# Patient Record
Sex: Female | Born: 1955
Health system: Southern US, Community
[De-identification: ages and names within clinical notes are randomized; demographics above are authoritative.]

## PROBLEM LIST (undated history)

## (undated) DIAGNOSIS — C50919 Malignant neoplasm of unspecified site of unspecified female breast: Secondary | ICD-10-CM

## (undated) DIAGNOSIS — T451X5A Adverse effect of antineoplastic and immunosuppressive drugs, initial encounter: Secondary | ICD-10-CM

## (undated) DIAGNOSIS — I219 Acute myocardial infarction, unspecified: Secondary | ICD-10-CM

## (undated) DIAGNOSIS — I2699 Other pulmonary embolism without acute cor pulmonale: Secondary | ICD-10-CM

## (undated) DIAGNOSIS — G629 Polyneuropathy, unspecified: Secondary | ICD-10-CM

## (undated) DIAGNOSIS — I739 Peripheral vascular disease, unspecified: Secondary | ICD-10-CM

## (undated) DIAGNOSIS — I10 Essential (primary) hypertension: Secondary | ICD-10-CM

## (undated) DIAGNOSIS — I471 Supraventricular tachycardia, unspecified: Secondary | ICD-10-CM

## (undated) DIAGNOSIS — R000 Tachycardia, unspecified: Secondary | ICD-10-CM

## (undated) DIAGNOSIS — K922 Gastrointestinal hemorrhage, unspecified: Secondary | ICD-10-CM

## (undated) DIAGNOSIS — R040 Epistaxis: Secondary | ICD-10-CM

## (undated) DIAGNOSIS — G620 Drug-induced polyneuropathy: Secondary | ICD-10-CM

## (undated) DIAGNOSIS — E785 Hyperlipidemia, unspecified: Secondary | ICD-10-CM

## (undated) DIAGNOSIS — A419 Sepsis, unspecified organism: Secondary | ICD-10-CM

## (undated) DIAGNOSIS — I427 Cardiomyopathy due to drug and external agent: Secondary | ICD-10-CM

## (undated) HISTORY — DX: Sepsis, unspecified organism: A41.9

## (undated) HISTORY — DX: Adverse effect of antineoplastic and immunosuppressive drugs, initial encounter: T45.1X5A

## (undated) HISTORY — DX: Hyperlipidemia, unspecified: E78.5

## (undated) HISTORY — DX: Essential (primary) hypertension: I10

## (undated) HISTORY — PX: BREAST SURGERY: SHX581

## (undated) HISTORY — DX: Acute myocardial infarction, unspecified: I21.9

## (undated) HISTORY — DX: Adverse effect of antineoplastic and immunosuppressive drugs, initial encounter: G62.0

## (undated) HISTORY — PX: CARDIAC CATHETERIZATION: SHX172

---

## 1996-09-28 DIAGNOSIS — I219 Acute myocardial infarction, unspecified: Secondary | ICD-10-CM

## 1996-09-28 HISTORY — DX: Acute myocardial infarction, unspecified: I21.9

## 2004-07-24 ENCOUNTER — Ambulatory Visit: Payer: Self-pay

## 2005-10-19 ENCOUNTER — Ambulatory Visit: Payer: Self-pay

## 2007-02-10 ENCOUNTER — Ambulatory Visit: Payer: Self-pay

## 2007-02-24 ENCOUNTER — Ambulatory Visit: Payer: Self-pay

## 2007-03-24 ENCOUNTER — Ambulatory Visit: Payer: Self-pay

## 2007-09-20 ENCOUNTER — Ambulatory Visit: Payer: Self-pay

## 2011-11-09 ENCOUNTER — Ambulatory Visit: Payer: Self-pay

## 2014-09-28 HISTORY — PX: BREAST BIOPSY: SHX20

## 2014-12-10 ENCOUNTER — Ambulatory Visit: Payer: Self-pay | Admitting: Family Medicine

## 2014-12-18 ENCOUNTER — Ambulatory Visit: Payer: Self-pay | Admitting: Family Medicine

## 2014-12-18 HISTORY — PX: BREAST SURGERY: SHX581

## 2014-12-25 ENCOUNTER — Ambulatory Visit
Admit: 2014-12-25 | Disposition: A | Discharge status: Home / Self Care | Payer: Self-pay | Attending: Internal Medicine | Admitting: Internal Medicine

## 2014-12-25 LAB — COMPREHENSIVE METABOLIC PANEL
ALBUMIN: 4.5 g/dL
AST: 31 U/L
Alkaline Phosphatase: 61 U/L
Anion Gap: 8 (ref 7–16)
BILIRUBIN TOTAL: 0.9 mg/dL
BUN: 12 mg/dL
CREATININE: 0.81 mg/dL
Calcium, Total: 9.2 mg/dL
Chloride: 104 mmol/L
Co2: 27 mmol/L
EGFR (Non-African Amer.): >60
Glucose: 99 mg/dL
POTASSIUM: 3.8 mmol/L
SGPT (ALT): 28 U/L
SODIUM: 139 mmol/L
TOTAL PROTEIN: 7.9 g/dL

## 2014-12-25 LAB — APTT: Activated PTT: 31 secs (ref 23.6–35.9)

## 2014-12-25 LAB — CBC CANCER CENTER
BASOS ABS: 0 x10 3/mm (ref 0.0–0.1)
Basophil %: 0.6 %
EOS ABS: 0.4 x10 3/mm (ref 0.0–0.7)
Eosinophil %: 6.3 %
HCT: 47 % (ref 35.0–47.0)
HGB: 15.1 g/dL (ref 12.0–16.0)
LYMPHS ABS: 2.4 x10 3/mm (ref 1.0–3.6)
Lymphocyte %: 35.8 %
MCH: 28.9 pg (ref 26.0–34.0)
MCHC: 32 g/dL (ref 32.0–36.0)
MCV: 90 fL (ref 80–100)
Monocyte #: 0.3 x10 3/mm (ref 0.2–0.9)
Monocyte %: 4.5 %
Neutrophil #: 3.6 x10 3/mm (ref 1.4–6.5)
Neutrophil %: 52.8 %
Platelet: 425 x10 3/mm (ref 150–440)
RBC: 5.21 10*6/uL — ABNORMAL HIGH (ref 3.80–5.20)
RDW: 14.5 % (ref 11.5–14.5)
WBC: 6.8 x10 3/mm (ref 3.6–11.0)

## 2014-12-25 LAB — PROTIME-INR
INR: 1
Prothrombin Time: 13.4 secs

## 2014-12-26 LAB — CANCER ANTIGEN 27.29: CA 27.29: 48.8 U/mL — ABNORMAL HIGH (ref 0.0–38.6)

## 2014-12-27 ENCOUNTER — Ambulatory Visit (INDEPENDENT_AMBULATORY_CARE_PROVIDER_SITE_OTHER): Payer: BLUE CROSS/BLUE SHIELD | Admitting: General Surgery

## 2014-12-27 ENCOUNTER — Other Ambulatory Visit: Payer: Self-pay | Admitting: General Surgery

## 2014-12-27 ENCOUNTER — Encounter: Payer: Self-pay | Admitting: General Surgery

## 2014-12-27 VITALS — BP 130/78 | Ht 66.0 in | Wt 163.0 lb

## 2014-12-27 DIAGNOSIS — C50912 Malignant neoplasm of unspecified site of left female breast: Secondary | ICD-10-CM

## 2014-12-27 DIAGNOSIS — Z170 Estrogen receptor positive status [ER+]: Secondary | ICD-10-CM

## 2014-12-27 DIAGNOSIS — C50412 Malignant neoplasm of upper-outer quadrant of left female breast: Secondary | ICD-10-CM | POA: Insufficient documentation

## 2014-12-27 NOTE — Progress Notes (Signed)
Patient ID: Sarah Horne, female   DOB: September 09, 1956, 59 y.o.   MRN: [id number]  Chief Complaint  Patient presents with  . Other    port placement    HPI Sarah Horne is a 59 y.o. female here today for a evaluation of a port placement. Patient sttates she starts chemo on 01/01/15 for a large left breast cancer. Patient had a left breast biopsy on 12/20/14. She is scheduled for a pet scan on 12/31/14.  HPI  Past Medical History  Diagnosis Date  . Hypertension   . Hyperlipidemia   . Heart attack 1998    Past Surgical History  Procedure Laterality Date  . Breast surgery Left 12/20/14    breast biopsy    Family History  Problem Relation Age of Onset  . Breast cancer Maternal Aunt   . Breast cancer Cousin   . Brain cancer Maternal Uncle     Social History History  Substance Use Topics  . Smoking status: Current Every Day Smoker -- 1.00 packs/day for 18 years    Types: Cigarettes  . Smokeless tobacco: Not on file  . Alcohol Use: No    No Known Allergies  Current Outpatient Prescriptions  Medication Sig Dispense Refill  . aspirin 325 MG tablet Take 325 mg by mouth daily.    Marland Kitchen atenolol (TENORMIN) 25 MG tablet     . lisinopril (PRINIVIL,ZESTRIL) 10 MG tablet     . simvastatin (ZOCOR) 40 MG tablet      No current facility-administered medications for this visit.    Review of Systems Review of Systems  Constitutional: Negative.   Respiratory: Negative.   Cardiovascular: Negative.     Blood pressure 130/78, height 5' 6"  (1.676 m), weight 163 lb (73.936 kg).  Physical Exam Physical Exam  Constitutional: She is oriented to person, place, and time. She appears well-developed and well-nourished.  Eyes: Conjunctivae are normal. No scleral icterus.  Neck: Neck supple.  Cardiovascular: Normal rate, regular rhythm and normal heart sounds.   No evidence of upper extremity venous occlusion.  Pulmonary/Chest: Effort normal and breath sounds normal.  Lymphadenopathy:    She has  no cervical adenopathy.  Neurological: She is alert and oriented to person, place, and time.  Skin: Skin is warm and dry.    Data Reviewed Medical oncology note of 12/25/2014. Mammogram dated 12/10/2014 showed a poorly defined mass with irregular microcalcifications in skin thickening in the upper-outer quadrant of the left breast. Enlarged axillary node identified.  Cardiac echo dated 12/26/2014 shows an ejection fraction of 50-55%.  Left breast biopsy shows invasive ductal carcinoma with lymphovascular invasion. Lymph node biopsy showed poorly differentiated carcinoma with breast primary. Grade 3. ER 90%, PR 50-90%, HER-2/neu 3+.  Assessment    Stage II carcinoma the left breast.    Plan    The risks and benefits of port placement including that of pneumothorax was reviewed with the patient. She is scheduled for a pet scan on Monday, 12/31/2014 and to begin treatment on Tuesday, 01/01/2015. Arrangements are in place for port placement after her PET scan. I spoke with nuclear medicine and requested that they leave her intravenous for the patent scan in (left side preferred) for use during her procedure in the operating room. The importance of having a driver after the procedure was discussed.     PCP:  Keith Rake Asad A Ref. Dr. Charletta Cousin, [place] 12/27/2014, 2:10 PM

## 2014-12-27 NOTE — Patient Instructions (Signed)
Implanted Port Insertion An implanted port is a central line that has a round shape and is placed under the skin. It is used as a long-term IV access for:   Medicines, such as chemotherapy.   Fluids.   Liquid nutrition, such as total parenteral nutrition (TPN).   Blood samples.  LET YOUR HEALTH CARE PROVIDER KNOW ABOUT:  Allergies to food or medicine.   Medicines taken, including vitamins, herbs, eye drops, creams, and over-the-counter medicines.   Any allergies to heparin.  Use of steroids (by mouth or creams).   Previous problems with anesthetics or numbing medicines.   History of bleeding problems or blood clots.   Previous surgery.   Other health problems, including diabetes and kidney problems.   Possibility of pregnancy, if this applies. RISKS AND COMPLICATIONS Generally, this is a safe procedure. However, as with any procedure, problems can occur. Possible problems include:  Damage to the blood vessel, bruising, or bleeding at the puncture site.   Infection.  Blood clot in the vessel that the port is in.  Breakdown of the skin over your port.  Very rarely a person may develop a condition called a pneumothorax, a collection of air in the chest that may cause one of the lungs to collapse. The placement of these catheters with the appropriate imaging guidance significantly decreases the risk of a pneumothorax.  BEFORE THE PROCEDURE   Your health care provider may want you to have blood tests. These tests can help tell how well your kidneys and liver are working. They can also show how well your blood clots.   If you take blood thinners (anticoagulant medicines), ask your health care provider when you should stop taking them.   Make arrangements for someone to drive you home. This is necessary if you have been sedated for your procedure.  PROCEDURE  Port insertion usually takes about 30-45 minutes.   An IV needle will be inserted in your arm.  Medicine for pain and medicine to help relax you (sedative) will flow directly into your body through this needle.   You will lie on an exam table, and you will be connected to monitors to keep track of your heart rate, blood pressure, and breathing throughout the procedure.  An oxygen monitoring device may be attached to your finger. Oxygen will be given.   Everything will be kept as germ free (sterile) as possible during the procedure. The skin near the point of the incision will be cleansed with antiseptic, and the area will be draped with sterile towels. The skin and deeper tissues over the port area will be made numb with a local anesthetic.  Two small cuts (incisions) will be made in the skin to insert the port. One will be made in the neck to obtain access to the vein where the catheter will lie.   Because the port reservoir will be placed under the skin, a small skin incision will be made in the upper chest, and a small pocket for the port will be made under the skin. The catheter that will be connected to the port tunnels to a large central vein in the chest. A small, raised area will remain on your body at the site of the reservoir when the procedure is complete.  The port placement will be done under imaging guidance to ensure the proper placement.  The reservoir has a silicone covering that can be punctured with a special needle.   The port will be flushed with normal   saline, and blood will be drawn to make sure it is working properly.  There will be nothing remaining outside the skin when the procedure is finished.   Incisions will be held together by stitches, surgical glue, or a special tape. AFTER THE PROCEDURE  You will stay in a recovery area until the anesthesia has worn off. Your blood pressure and pulse will be checked.  A final chest X-ray will be taken to check the placement of the port and to ensure that there is no injury to your lung. Document Released:  07/05/2013 Document Revised: 01/29/2014 Document Reviewed: 07/05/2013 [facility] Patient Information 2015 [place], [place]. This information is not intended to replace advice given to you by your health care provider. Make sure you discuss any questions you have with your health care provider.

## 2014-12-28 ENCOUNTER — Ambulatory Visit
Admit: 2014-12-28 | Disposition: A | Discharge status: Home / Self Care | Payer: Self-pay | Attending: Internal Medicine | Admitting: Internal Medicine

## 2014-12-31 ENCOUNTER — Ambulatory Visit
Admit: 2014-12-31 | Disposition: A | Discharge status: Home / Self Care | Payer: Self-pay | Attending: Internal Medicine | Admitting: Internal Medicine

## 2014-12-31 ENCOUNTER — Ambulatory Visit
Admit: 2014-12-31 | Disposition: A | Discharge status: Home / Self Care | Payer: Self-pay | Attending: General Surgery | Admitting: General Surgery

## 2014-12-31 ENCOUNTER — Encounter: Payer: Self-pay | Admitting: General Surgery

## 2014-12-31 DIAGNOSIS — C50912 Malignant neoplasm of unspecified site of left female breast: Secondary | ICD-10-CM | POA: Diagnosis not present

## 2014-12-31 HISTORY — PX: PORTACATH PLACEMENT: SHX2246

## 2015-01-01 LAB — BASIC METABOLIC PANEL
Anion Gap: 7 (ref 7–16)
BUN: 12 mg/dL
CREATININE: 0.83 mg/dL
Calcium, Total: 9 mg/dL
Chloride: 104 mmol/L
Co2: 28 mmol/L
EGFR (African American): >60
EGFR (Non-African Amer.): >60
GLUCOSE: 143 mg/dL — AB
POTASSIUM: 3.5 mmol/L
Sodium: 139 mmol/L

## 2015-01-01 LAB — CBC CANCER CENTER
BASOS PCT: 0.1 %
Basophil #: 0 x10 3/mm (ref 0.0–0.1)
EOS PCT: 0 %
Eosinophil #: 0 x10 3/mm (ref 0.0–0.7)
HCT: 44.7 % (ref 35.0–47.0)
HGB: 14.7 g/dL (ref 12.0–16.0)
LYMPHS PCT: 3.3 %
Lymphocyte #: 0.4 x10 3/mm — ABNORMAL LOW (ref 1.0–3.6)
MCH: 29.8 pg (ref 26.0–34.0)
MCHC: 33 g/dL (ref 32.0–36.0)
MCV: 90 fL (ref 80–100)
Monocyte #: 0.1 x10 3/mm — ABNORMAL LOW (ref 0.2–0.9)
Monocyte %: 0.4 %
NEUTROS PCT: 96.2 %
Neutrophil #: 11.7 x10 3/mm — ABNORMAL HIGH (ref 1.4–6.5)
Platelet: 384 x10 3/mm (ref 150–440)
RBC: 4.94 10*6/uL (ref 3.80–5.20)
RDW: 14.3 % (ref 11.5–14.5)
WBC: 12.2 x10 3/mm — ABNORMAL HIGH (ref 3.6–11.0)

## 2015-01-02 ENCOUNTER — Encounter: Payer: Self-pay | Admitting: General Surgery

## 2015-01-04 ENCOUNTER — Ambulatory Visit
Admit: 2015-01-04 | Disposition: A | Discharge status: Home / Self Care | Payer: Self-pay | Attending: Internal Medicine | Admitting: Internal Medicine

## 2015-01-04 LAB — PROTIME-INR
INR: 1
Prothrombin Time: 13.5 secs

## 2015-01-04 LAB — CBC WITH DIFFERENTIAL/PLATELET
BASOS ABS: 0 10*3/uL (ref 0.0–0.1)
BASOS PCT: 0.7 %
Eosinophil #: 0.4 10*3/uL (ref 0.0–0.7)
Eosinophil %: 5.7 %
HCT: 43.8 % (ref 35.0–47.0)
HGB: 14.5 g/dL (ref 12.0–16.0)
LYMPHS PCT: 36.2 %
Lymphocyte #: 2.6 10*3/uL (ref 1.0–3.6)
MCH: 29.9 pg (ref 26.0–34.0)
MCHC: 33 g/dL (ref 32.0–36.0)
MCV: 91 fL (ref 80–100)
MONO ABS: 0.4 x10 3/mm (ref 0.2–0.9)
Monocyte %: 5.6 %
NEUTROS ABS: 3.7 10*3/uL (ref 1.4–6.5)
NEUTROS PCT: 51.8 %
Platelet: 366 10*3/uL (ref 150–440)
RBC: 4.84 10*6/uL (ref 3.80–5.20)
RDW: 14.2 % (ref 11.5–14.5)
WBC: 7.1 10*3/uL (ref 3.6–11.0)

## 2015-01-08 LAB — COMPREHENSIVE METABOLIC PANEL
ALBUMIN: 3.9 g/dL
ALK PHOS: 61 U/L
Anion Gap: 8 (ref 7–16)
BUN: 17 mg/dL
Bilirubin,Total: 0.8 mg/dL
CALCIUM: 9.3 mg/dL
Chloride: 101 mmol/L
Co2: 26 mmol/L
Creatinine: 0.78 mg/dL
EGFR (Non-African Amer.): >60
Glucose: 161 mg/dL — ABNORMAL HIGH
Potassium: 3.7 mmol/L
SGOT(AST): 38 U/L
SGPT (ALT): 38 U/L
SODIUM: 135 mmol/L
Total Protein: 7.6 g/dL

## 2015-01-08 LAB — CBC CANCER CENTER
BASOS PCT: 0.3 %
Basophil #: 0 x10 3/mm (ref 0.0–0.1)
EOS PCT: 0.1 %
Eosinophil #: 0 x10 3/mm (ref 0.0–0.7)
HCT: 43.9 % (ref 35.0–47.0)
HGB: 14.5 g/dL (ref 12.0–16.0)
Lymphocyte #: 0.9 x10 3/mm — ABNORMAL LOW (ref 1.0–3.6)
Lymphocyte %: 6.2 %
MCH: 29.8 pg (ref 26.0–34.0)
MCHC: 33.1 g/dL (ref 32.0–36.0)
MCV: 90 fL (ref 80–100)
Monocyte #: 0.2 x10 3/mm (ref 0.2–0.9)
Monocyte %: 1.1 %
NEUTROS PCT: 92.3 %
Neutrophil #: 13 x10 3/mm — ABNORMAL HIGH (ref 1.4–6.5)
PLATELETS: 416 x10 3/mm (ref 150–440)
RBC: 4.87 10*6/uL (ref 3.80–5.20)
RDW: 14.4 % (ref 11.5–14.5)
WBC: 14.1 x10 3/mm — ABNORMAL HIGH (ref 3.6–11.0)

## 2015-01-08 LAB — CREATININE, SERUM: CREATINE, SERUM: 0.78

## 2015-01-15 LAB — CBC CANCER CENTER
BASOS ABS: 0 x10 3/mm (ref 0.0–0.1)
Basophil %: 1.3 %
EOS PCT: 0.2 %
Eosinophil #: 0 x10 3/mm (ref 0.0–0.7)
HCT: 42.1 % (ref 35.0–47.0)
HGB: 13.8 g/dL (ref 12.0–16.0)
LYMPHS ABS: 1.9 x10 3/mm (ref 1.0–3.6)
Lymphocyte %: 85.3 %
MCH: 29.6 pg (ref 26.0–34.0)
MCHC: 32.9 g/dL (ref 32.0–36.0)
MCV: 90 fL (ref 80–100)
MONO ABS: 0 x10 3/mm — AB (ref 0.2–0.9)
MONOS PCT: 1.5 %
NEUTROS PCT: 11.7 %
Neutrophil #: 0.3 x10 3/mm — ABNORMAL LOW (ref 1.4–6.5)
Platelet: 344 x10 3/mm (ref 150–440)
RBC: 4.67 10*6/uL (ref 3.80–5.20)
RDW: 14 % (ref 11.5–14.5)
WBC: 2.2 x10 3/mm — ABNORMAL LOW (ref 3.6–11.0)

## 2015-01-21 LAB — SURGICAL PATHOLOGY

## 2015-01-22 LAB — CBC CANCER CENTER
BASOS PCT: 0.1 %
Basophil #: 0 x10 3/mm (ref 0.0–0.1)
EOS PCT: 0 %
Eosinophil #: 0 x10 3/mm (ref 0.0–0.7)
HCT: 36.6 % (ref 35.0–47.0)
HGB: 11.9 g/dL — AB (ref 12.0–16.0)
LYMPHS ABS: 3.4 x10 3/mm (ref 1.0–3.6)
Lymphocyte %: 8.6 %
MCH: 29.3 pg (ref 26.0–34.0)
MCHC: 32.6 g/dL (ref 32.0–36.0)
MCV: 90 fL (ref 80–100)
MONO ABS: 1 x10 3/mm — AB (ref 0.2–0.9)
MONOS PCT: 2.6 %
NEUTROS ABS: 34.8 x10 3/mm — AB (ref 1.4–6.5)
Neutrophil %: 88.7 %
Platelet: 272 x10 3/mm (ref 150–440)
RBC: 4.08 10*6/uL (ref 3.80–5.20)
RDW: 14.1 % (ref 11.5–14.5)
WBC: 39.3 x10 3/mm — AB (ref 3.6–11.0)

## 2015-01-22 LAB — CREATININE, SERUM: Creatinine: 0.82 mg/dL

## 2015-01-22 LAB — HEPATIC FUNCTION PANEL A (ARMC)
ALK PHOS: 157 U/L — AB
ALT: 42 U/L
AST: 34 U/L
Albumin: 3.9 g/dL
Bilirubin, Direct: <0.1 mg/dL
Bilirubin,Total: 0.2 mg/dL — ABNORMAL LOW
TOTAL PROTEIN: 6.8 g/dL

## 2015-01-27 NOTE — Op Note (Signed)
PATIENT NAME:  Sarah Horne, Sarah Horne MR#:  [id number] DATE OF BIRTH:  1956-09-23  DATE OF PROCEDURE:  12/31/2014  PREOPERATIVE DIAGNOSIS: Breast cancer, need for central venous access.   POSTOPERATIVE DIAGNOSIS: Breast cancer, need for central venous access.  OPERATIVE PROCEDURE: Right subclavian PowerPort placement with ultrasound and fluoroscopic guidance.   SURGEON: [name], MD   ANESTHESIA: Local, 15 mL of 1% plain Xylocaine.   ESTIMATED BLOOD LOSS: Minimal.   CLINICAL NOTE: This 59 year old woman has a T2, N1 cancer and is considered a candidate for neoadjuvant chemotherapy. Central venous access was requested by her treating oncologist.   PROCEDURE IN DETAIL: The patient received Kefzol prior to the procedure. The right chest and neck were prepped with ChloraPrep and draped. Ultrasound was used to confirm patency of the subclavian vein. This was cannulated under ultrasound guidance. The guidewire was passed followed by the dilator and catheter. The catheter was placed in the distal SVC/right atrial junction. It was tunneled to a pocket on the right anterior chest. The port was transfixed to the deep tissue with 3-0 Prolene sutures. The subcutaneous tissue was approximated with running 3-0 Vicryl, and the skin closed with a running 4-0 Vicryl subcuticular suture. Benzoin, Steri-Strips, Telfa, and Tegaderm dressings were applied.   Erect chest x-ray in the recovery room showed the catheter tip as described above and no evidence of pneumothorax.   The patient tolerated the procedure well and was taken to the recovery room in stable condition.   ____________________________ [name], MD jwb:bm D: 01/01/2015 21:05:09 ET T: 01/02/2015 07:56:01 ET JOB#: [id number]  cc: [name], MD, <Dictator> Sandeep R. Ma Hillock, MD Rochel Brome, MD JEFFREY Amedeo Kinsman MD ELECTRONICALLY SIGNED 01/02/2015 22:14

## 2015-01-28 ENCOUNTER — Other Ambulatory Visit: Payer: Self-pay

## 2015-01-28 ENCOUNTER — Ambulatory Visit: Payer: Self-pay

## 2015-01-28 ENCOUNTER — Other Ambulatory Visit: Payer: Self-pay | Admitting: *Deleted

## 2015-01-28 ENCOUNTER — Ambulatory Visit: Payer: Self-pay | Admitting: Internal Medicine

## 2015-01-28 DIAGNOSIS — C50912 Malignant neoplasm of unspecified site of left female breast: Secondary | ICD-10-CM

## 2015-01-29 ENCOUNTER — Inpatient Hospital Stay: Payer: BLUE CROSS/BLUE SHIELD | Attending: Internal Medicine

## 2015-01-29 ENCOUNTER — Inpatient Hospital Stay: Payer: BLUE CROSS/BLUE SHIELD

## 2015-01-29 ENCOUNTER — Inpatient Hospital Stay (HOSPITAL_BASED_OUTPATIENT_CLINIC_OR_DEPARTMENT_OTHER): Payer: BLUE CROSS/BLUE SHIELD | Admitting: Internal Medicine

## 2015-01-29 VITALS — BP 127/81 | HR 58

## 2015-01-29 VITALS — BP 146/92 | HR 67 | Temp 96.0°F | Ht 65.0 in | Wt 163.6 lb

## 2015-01-29 DIAGNOSIS — Z803 Family history of malignant neoplasm of breast: Secondary | ICD-10-CM

## 2015-01-29 DIAGNOSIS — I10 Essential (primary) hypertension: Secondary | ICD-10-CM | POA: Insufficient documentation

## 2015-01-29 DIAGNOSIS — Z5111 Encounter for antineoplastic chemotherapy: Secondary | ICD-10-CM | POA: Diagnosis not present

## 2015-01-29 DIAGNOSIS — C50912 Malignant neoplasm of unspecified site of left female breast: Secondary | ICD-10-CM

## 2015-01-29 DIAGNOSIS — C50412 Malignant neoplasm of upper-outer quadrant of left female breast: Secondary | ICD-10-CM | POA: Insufficient documentation

## 2015-01-29 DIAGNOSIS — Z7982 Long term (current) use of aspirin: Secondary | ICD-10-CM | POA: Diagnosis not present

## 2015-01-29 DIAGNOSIS — K769 Liver disease, unspecified: Secondary | ICD-10-CM

## 2015-01-29 DIAGNOSIS — Z170 Estrogen receptor positive status [ER+]: Secondary | ICD-10-CM

## 2015-01-29 DIAGNOSIS — T451X5A Adverse effect of antineoplastic and immunosuppressive drugs, initial encounter: Secondary | ICD-10-CM | POA: Insufficient documentation

## 2015-01-29 DIAGNOSIS — Z7689 Persons encountering health services in other specified circumstances: Secondary | ICD-10-CM | POA: Insufficient documentation

## 2015-01-29 DIAGNOSIS — F17210 Nicotine dependence, cigarettes, uncomplicated: Secondary | ICD-10-CM | POA: Diagnosis not present

## 2015-01-29 DIAGNOSIS — R531 Weakness: Secondary | ICD-10-CM | POA: Diagnosis not present

## 2015-01-29 DIAGNOSIS — I252 Old myocardial infarction: Secondary | ICD-10-CM | POA: Diagnosis not present

## 2015-01-29 DIAGNOSIS — D72829 Elevated white blood cell count, unspecified: Secondary | ICD-10-CM | POA: Diagnosis not present

## 2015-01-29 DIAGNOSIS — C773 Secondary and unspecified malignant neoplasm of axilla and upper limb lymph nodes: Secondary | ICD-10-CM

## 2015-01-29 DIAGNOSIS — E785 Hyperlipidemia, unspecified: Secondary | ICD-10-CM

## 2015-01-29 DIAGNOSIS — Z79899 Other long term (current) drug therapy: Secondary | ICD-10-CM

## 2015-01-29 LAB — BASIC METABOLIC PANEL
ANION GAP: 5 (ref 5–15)
BUN: 16 mg/dL (ref 6–20)
CALCIUM: 8.7 mg/dL — AB (ref 8.9–10.3)
CHLORIDE: 103 mmol/L (ref 101–111)
CO2: 26 mmol/L (ref 22–32)
Creatinine, Ser: 0.78 mg/dL (ref 0.44–1.00)
GFR calc Af Amer: >60 mL/min (ref >60)
GFR calc non Af Amer: >60 mL/min (ref >60)
Glucose, Bld: 138 mg/dL — ABNORMAL HIGH (ref 65–99)
Potassium: 3.8 mmol/L (ref 3.5–5.1)
Sodium: 134 mmol/L — ABNORMAL LOW (ref 135–145)

## 2015-01-29 LAB — CBC WITH DIFFERENTIAL/PLATELET
BASOS ABS: 0 10*3/uL (ref 0–0.1)
Eosinophils Absolute: 0 10*3/uL (ref 0–0.7)
Eosinophils Relative: 0 %
HCT: 36.7 % (ref 35.0–47.0)
HEMOGLOBIN: 12.2 g/dL (ref 12.0–16.0)
Lymphocytes Relative: 14 %
Lymphs Abs: 0.5 10*3/uL — ABNORMAL LOW (ref 1.0–3.6)
MCH: 29.8 pg (ref 26.0–34.0)
MCHC: 33.2 g/dL (ref 32.0–36.0)
MCV: 89.8 fL (ref 80.0–100.0)
Monocytes Absolute: 0 10*3/uL — ABNORMAL LOW (ref 0.2–0.9)
Monocytes Relative: 1 %
NEUTROS ABS: 3.1 10*3/uL (ref 1.4–6.5)
Neutrophils Relative %: 85 %
Platelets: 293 10*3/uL (ref 150–440)
RBC: 4.09 MIL/uL (ref 3.80–5.20)
RDW: 14.6 % — ABNORMAL HIGH (ref 11.5–14.5)
WBC: 3.7 10*3/uL (ref 3.6–11.0)

## 2015-01-29 MED ORDER — SODIUM CHLORIDE 0.9 % IV SOLN: Freq: Once | INTRAVENOUS | Status: DC

## 2015-01-29 MED ORDER — SODIUM CHLORIDE 0.9 % IV SOLN
550.5000 mg | Freq: Once | INTRAVENOUS | Status: AC
  Administered 2015-01-29: 550.5 mg via INTRAVENOUS
  Filled 2015-01-29: qty 55.05

## 2015-01-29 MED ORDER — FOSAPREPITANT DIMEGLUMINE INJECTION 150 MG
Freq: Once | INTRAVENOUS | Status: AC
  Administered 2015-01-29: 12:00:00 via INTRAVENOUS
  Filled 2015-01-29: qty 5

## 2015-01-29 MED ORDER — ACETAMINOPHEN 325 MG PO TABS
650.0000 mg | ORAL_TABLET | Freq: Once | ORAL | Status: AC
  Administered 2015-01-29: 650 mg via ORAL
  Filled 2015-01-29: qty 2

## 2015-01-29 MED ORDER — LIDOCAINE-PRILOCAINE 2.5-2.5 % EX CREA: TOPICAL_CREAM | CUTANEOUS | Status: DC

## 2015-01-29 MED ORDER — PALONOSETRON HCL INJECTION 0.25 MG/5ML
0.2500 mg | Freq: Once | INTRAVENOUS | Status: AC
Start: 2015-01-29 — End: 2015-01-29
  Administered 2015-01-29: 0.25 mg via INTRAVENOUS
  Filled 2015-01-29: qty 5

## 2015-01-29 MED ORDER — SODIUM CHLORIDE 0.9 % IV SOLN
420.0000 mg | Freq: Once | INTRAVENOUS | Status: AC
  Administered 2015-01-29: 420 mg via INTRAVENOUS
  Filled 2015-01-29: qty 14

## 2015-01-29 MED ORDER — DOCETAXEL CHEMO INJECTION 160 MG/16ML
75.0000 mg/m2 | Freq: Once | INTRAVENOUS | Status: AC
  Administered 2015-01-29: 140 mg via INTRAVENOUS
  Filled 2015-01-29: qty 14

## 2015-01-29 MED ORDER — SODIUM CHLORIDE 0.9 % IV SOLN
440.0000 mg | Freq: Once | INTRAVENOUS | Status: AC
  Administered 2015-01-29: 440 mg via INTRAVENOUS
  Filled 2015-01-29: qty 20.95

## 2015-01-29 MED ORDER — HEPARIN SOD (PORK) LOCK FLUSH 100 UNIT/ML IV SOLN
INTRAVENOUS | Status: AC
  Filled 2015-01-29: qty 5

## 2015-01-29 MED ORDER — DIPHENHYDRAMINE HCL 50 MG/ML IJ SOLN
25.0000 mg | Freq: Once | INTRAMUSCULAR | Status: AC
  Administered 2015-01-29: 25 mg via INTRAVENOUS
  Filled 2015-01-29: qty 1

## 2015-01-29 MED ORDER — SODIUM CHLORIDE 0.9 % IV SOLN
Freq: Once | INTRAVENOUS | Status: AC
  Administered 2015-01-29: 12:00:00 via INTRAVENOUS
  Filled 2015-01-29: qty 250

## 2015-01-29 MED ORDER — FAMOTIDINE IN NACL 20-0.9 MG/50ML-% IV SOLN
20.0000 mg | Freq: Two times a day (BID) | INTRAVENOUS | Status: DC
  Administered 2015-01-29: 20 mg via INTRAVENOUS
  Filled 2015-01-29 (×3): qty 50

## 2015-01-30 ENCOUNTER — Other Ambulatory Visit: Payer: Self-pay | Admitting: *Deleted

## 2015-01-31 ENCOUNTER — Inpatient Hospital Stay: Payer: BLUE CROSS/BLUE SHIELD

## 2015-01-31 VITALS — BP 142/82 | HR 64 | Temp 98.0°F | Resp 20

## 2015-01-31 DIAGNOSIS — C50912 Malignant neoplasm of unspecified site of left female breast: Secondary | ICD-10-CM

## 2015-01-31 DIAGNOSIS — C50412 Malignant neoplasm of upper-outer quadrant of left female breast: Secondary | ICD-10-CM | POA: Diagnosis not present

## 2015-01-31 MED ORDER — PEGFILGRASTIM INJECTION 6 MG/0.6ML
6.0000 mg | Freq: Once | SUBCUTANEOUS | Status: AC
  Administered 2015-01-31: 6 mg via SUBCUTANEOUS
  Filled 2015-01-31: qty 0.6

## 2015-02-05 ENCOUNTER — Inpatient Hospital Stay: Payer: BLUE CROSS/BLUE SHIELD

## 2015-02-05 DIAGNOSIS — C50412 Malignant neoplasm of upper-outer quadrant of left female breast: Secondary | ICD-10-CM | POA: Diagnosis not present

## 2015-02-05 LAB — CBC WITH DIFFERENTIAL/PLATELET
Basophils Absolute: 0 10*3/uL (ref 0–0.1)
Basophils Relative: 1 %
Eosinophils Absolute: 0 10*3/uL (ref 0–0.7)
Eosinophils Relative: 0 %
HCT: 38.5 % (ref 35.0–47.0)
HEMOGLOBIN: 12.8 g/dL (ref 12.0–16.0)
LYMPHS PCT: 38 %
Lymphs Abs: 2 10*3/uL (ref 1.0–3.6)
MCH: 29.9 pg (ref 26.0–34.0)
MCHC: 33.3 g/dL (ref 32.0–36.0)
MCV: 89.9 fL (ref 80.0–100.0)
Monocytes Absolute: 0.8 10*3/uL (ref 0.2–0.9)
Monocytes Relative: 15 %
NEUTROS PCT: 46 %
Neutro Abs: 2.4 10*3/uL (ref 1.4–6.5)
Platelets: 244 10*3/uL (ref 150–440)
RBC: 4.28 MIL/uL (ref 3.80–5.20)
RDW: 14.8 % — ABNORMAL HIGH (ref 11.5–14.5)
WBC: 5.2 10*3/uL (ref 3.6–11.0)

## 2015-02-12 ENCOUNTER — Inpatient Hospital Stay: Payer: BLUE CROSS/BLUE SHIELD

## 2015-02-12 DIAGNOSIS — C50412 Malignant neoplasm of upper-outer quadrant of left female breast: Secondary | ICD-10-CM | POA: Diagnosis not present

## 2015-02-12 LAB — HEPATIC FUNCTION PANEL
ALK PHOS: 97 U/L (ref 38–126)
ALT: 34 U/L (ref 14–54)
AST: 27 U/L (ref 15–41)
Albumin: 4 g/dL (ref 3.5–5.0)
Total Bilirubin: 0.4 mg/dL (ref 0.3–1.2)
Total Protein: 6.6 g/dL (ref 6.5–8.1)

## 2015-02-12 LAB — CBC WITH DIFFERENTIAL/PLATELET
BASOS ABS: 0 10*3/uL (ref 0–0.1)
Basophils Relative: 0 %
EOS PCT: 0 %
Eosinophils Absolute: 0 10*3/uL (ref 0–0.7)
HEMATOCRIT: 38.7 % (ref 35.0–47.0)
HEMOGLOBIN: 12.5 g/dL (ref 12.0–16.0)
Lymphocytes Relative: 21 %
Lymphs Abs: 3.3 10*3/uL (ref 1.0–3.6)
MCH: 29.4 pg (ref 26.0–34.0)
MCHC: 32.2 g/dL (ref 32.0–36.0)
MCV: 91.3 fL (ref 80.0–100.0)
MONO ABS: 0.5 10*3/uL (ref 0.2–0.9)
MONOS PCT: 3 %
NEUTROS ABS: 11.8 10*3/uL — AB (ref 1.4–6.5)
Neutrophils Relative %: 76 %
Platelets: 229 10*3/uL (ref 150–440)
RBC: 4.24 MIL/uL (ref 3.80–5.20)
RDW: 15.7 % — AB (ref 11.5–14.5)
WBC: 15.7 10*3/uL — ABNORMAL HIGH (ref 3.6–11.0)

## 2015-02-12 LAB — CREATININE, SERUM
CREATININE: 0.82 mg/dL (ref 0.44–1.00)
GFR calc Af Amer: >60 mL/min (ref >60)

## 2015-02-13 NOTE — Progress Notes (Signed)
[place]  Telephone:(336) [phone] Fax:(336) [phone]     ID: Brennan Karam OB: 04-10-1956  MR#: [id number]  KNL#:[id number]  Patient Care Team: [name], MD as PCP - General (Family Medicine) [name], MD (General Surgery) [name], MD as Attending Physician (Internal Medicine)  CHIEF COMPLAINT/DIAGNOSIS:   Invasive ductal carcinoma of the left breast grade 2 on biopsy  (clinically T2N1Mx, stage II disease). Tumor size 4.3 x 4.0 x 3.4 cm. Left axillary lymph node measuring 2.7 cm. Left axillary lymph node biopsy positive for involvement by poorly differentiated carcinoma consistent with breast primary, left breast mass biopsy showed invasive ductal carcinoma grade 2, biopsy done on 12/18/2014. ER positive greater than 90%, PR positive 51% to 90%, HER2/neu positive (3+ on IHC). PET scan 12/31/14 - IMPRESSION: 1. Hypermetabolic left breast mass consists with primary carcinoma. 2. Evidence of local nodal metastasis to the left axilla. 3. No evidence of central nodal metastasis to mediastinum. 4. Two lesions in liver with mild metabolic activity. Differential would include focal nodular hyperplasia versus metastatic breast cancer. Recommend either a contrast-enhanced liver MRI or for added specificity ultrasound-guided hepatic biopsy. CT-guided liver biopsy 01/04/15 - negative for malignancy.  Patient started on Neoadjuvant chemotherapy with Taxotere/Carboplatin/Herceptin/Perjeta on 01/08/15.  HISTORY OF PRESENT ILLNESS:  patient here for scheduled oncology evaluation and plan cycle 2 chemotherapy. States she is doing steady overall. Denies nausea or vomiting. No diarrhea. Appetite is good and denies unintentional weight loss. She has occasional arthritis and low back pain. Denies any new cough, dyspnea, chest pain, or hemoptysis. States she feels breast mass is smaller on self-exam.    REVIEW OF SYSTEMS:   ROS As in HPI above. In addition, no chills,  fevers. No headaches, dizziness or epistaxis. No cough.  No shortness of breath. No wheezing. No hemoptysis. No retrosternal chest pain. No orthopnea, PND. abdominal pain, nausea or vomiting. No diarrhea. No dysuria or hematuria. No new back or bone pain. No numbness or tingling of extremities. No polyuria or polydipsia. PS ECOG 1.  PAST MEDICAL HISTORY: Past Medical History  Diagnosis Date  . Hypertension   . Hyperlipidemia   . Heart attack 1998    PAST SURGICAL HISTORY: Past Surgical History  Procedure Laterality Date  . Breast surgery Left 12/20/14    breast biopsy    FAMILY HISTORY Family History  Problem Relation Age of Onset  . Breast cancer Maternal Aunt   . Breast cancer Cousin   . Brain cancer Maternal Uncle    SOCIAL HISTORY: History  Substance Use Topics  . Smoking status: Current Every Day Smoker -- 1.00 packs/day for 18 years    Types: Cigarettes  . Smokeless tobacco: Not on file  . Alcohol Use: No     Allergies  Allergen Reactions  . No Known Allergies     Current Outpatient Prescriptions  Medication Sig Dispense Refill  . aspirin 325 MG tablet Take 325 mg by mouth daily.    Marland Kitchen atenolol (TENORMIN) 25 MG tablet     . dexamethasone (DECADRON) 4 MG tablet Take 8 mg by mouth 2 (two) times daily. Take two tablets twice daily x 3 days starting on the day before each dose of Taxotere chemotherapy    . HYDROcodone-acetaminophen (NORCO/VICODIN) 5-325 MG per tablet Take 1 tablet by mouth every 4 (four) hours as needed for moderate pain.    Marland Kitchen lisinopril (PRINIVIL,ZESTRIL) 10 MG tablet     . simvastatin (ZOCOR) 40 MG tablet     .  lidocaine-prilocaine (EMLA) cream Apply to affected area once 30 g 3   No current facility-administered medications for this visit.   Facility-Administered Medications Ordered in Other Visits  Medication Dose Route Frequency Provider Last Rate Last Dose  . famotidine (PEPCID) IVPB 20 mg  20 mg Intravenous Q12H [name], MD   20 mg  at 01/29/15 1230    OBJECTIVE: Filed Vitals:   01/29/15 1000  BP: 146/92  Pulse: 67  Temp: 96 F (35.6 C)     Body mass index is 27.22 kg/(m^2).    ECOG FS:1 - Symptomatic but completely ambulatory  General: Well-developed, well-nourished, no acute distress. HEENT: Normocephalic, moist mucous membranes, clear oropharnyx. Lungs: Clear to auscultation bilaterally. Heart: Regular rate and rhythm. No rubs, murmurs, or gallops. Abdomen: Soft, nontender, no organomegaly noted  Ext: No edema, cyanosis, or clubbing. Neuro: Alert, answering all questions appropriately. Cranial nerves grossly intact.  LAB RESULTS: Reviewed from today           Lab Results  Component Value Date   LABCA2 48.8* 12/25/2014    ASSESSMENT/PLAN:   1. Invasive ductal carcinoma of the left breast grade 2 on biopsy  (clinically T2N1Mx, stage II disease). Tumor size 4.3 x 4.0 x 3.4 cm. Left axillary lymph node measuring 2.7 cm. Left axillary lymph node biopsy positive for involvement by poorly differentiated carcinoma consistent with breast primary, left breast mass biopsy showed invasive ductal carcinoma grade 2, biopsy done on 12/18/2014. ER positive greater than 90%, PR positive 51% to 90%, HER2/neu positive (3+ on IHC). CT-guided liver biopsy 01/04/15 negative for malignancy. Patient started on Neoadjuvant chemotherapy with Taxotere/Carboplatin/Herceptin/Perjeta today (01/08/15) - overall tolerated cycle 1 chemo fairly well.. Breast mass smaller on exam today. Will continue on current chemo with Taxotere/Carboplatin/Herceptin/Perjeta (given every 3 weeks 6 cycles, and then postoperatively she will complete Herceptin course of total 52 weeks). Have reviewed labs, will start cycle 2 chemotherapy with Taxotere 75 mg/m IV, Carboplatin AUC of 6 IV, Herceptin loading dose 8 mg/kg IV, Perjeta loading dose 840 mg IV today. Neulasta inj 6 mg SQ on day 2 given higher risk of febrile neutropenia and myelosuppression (20% or  higher). Monitor weekly labs and follow-up in 3 weeks to plan cycle 2 treatment. After neoadjuvant chemotherapy, she will go for surgery and if she has breast conservation surgery she will then receive radiation therapy, to be followed by adjuvant homornal therapy given ER/PR positive disease.  2. Leukocytosis - likely Decadron effect. No fevers or symptoms of infection. 3. Pain - no issues at this time. 4. Nutrition - encouraged to maintain an adequate protein-calorie intake. 5. In between visits, patient advised to call or come to the ER in case of fevers, bleeding, acute sickness or new symptoms. She is agreeable to above plan.  [name], MD   02/13/2015 11:03 PM

## 2015-02-18 ENCOUNTER — Other Ambulatory Visit: Payer: Self-pay | Admitting: Oncology

## 2015-02-19 ENCOUNTER — Inpatient Hospital Stay (HOSPITAL_BASED_OUTPATIENT_CLINIC_OR_DEPARTMENT_OTHER): Payer: BLUE CROSS/BLUE SHIELD | Admitting: Oncology

## 2015-02-19 ENCOUNTER — Inpatient Hospital Stay: Payer: BLUE CROSS/BLUE SHIELD

## 2015-02-19 VITALS — BP 126/79 | HR 72 | Temp 97.4°F | Resp 20 | Wt 163.1 lb

## 2015-02-19 VITALS — BP 153/84

## 2015-02-19 DIAGNOSIS — D72829 Elevated white blood cell count, unspecified: Secondary | ICD-10-CM

## 2015-02-19 DIAGNOSIS — Z7982 Long term (current) use of aspirin: Secondary | ICD-10-CM

## 2015-02-19 DIAGNOSIS — Z79899 Other long term (current) drug therapy: Secondary | ICD-10-CM

## 2015-02-19 DIAGNOSIS — M545 Low back pain: Secondary | ICD-10-CM

## 2015-02-19 DIAGNOSIS — C773 Secondary and unspecified malignant neoplasm of axilla and upper limb lymph nodes: Secondary | ICD-10-CM | POA: Diagnosis not present

## 2015-02-19 DIAGNOSIS — C50912 Malignant neoplasm of unspecified site of left female breast: Secondary | ICD-10-CM

## 2015-02-19 DIAGNOSIS — F17210 Nicotine dependence, cigarettes, uncomplicated: Secondary | ICD-10-CM

## 2015-02-19 DIAGNOSIS — Z170 Estrogen receptor positive status [ER+]: Secondary | ICD-10-CM | POA: Diagnosis not present

## 2015-02-19 DIAGNOSIS — C50412 Malignant neoplasm of upper-outer quadrant of left female breast: Secondary | ICD-10-CM

## 2015-02-19 DIAGNOSIS — M1990 Unspecified osteoarthritis, unspecified site: Secondary | ICD-10-CM

## 2015-02-19 DIAGNOSIS — I252 Old myocardial infarction: Secondary | ICD-10-CM

## 2015-02-19 DIAGNOSIS — I10 Essential (primary) hypertension: Secondary | ICD-10-CM

## 2015-02-19 DIAGNOSIS — E785 Hyperlipidemia, unspecified: Secondary | ICD-10-CM

## 2015-02-19 LAB — CBC WITH DIFFERENTIAL/PLATELET
Basophils Absolute: 0 10*3/uL (ref 0–0.1)
Basophils Relative: 0 %
EOS ABS: 0 10*3/uL (ref 0–0.7)
Eosinophils Relative: 0 %
HCT: 34 % — ABNORMAL LOW (ref 35.0–47.0)
Hemoglobin: 11.1 g/dL — ABNORMAL LOW (ref 12.0–16.0)
LYMPHS PCT: 3 %
Lymphs Abs: 0.5 10*3/uL — ABNORMAL LOW (ref 1.0–3.6)
MCH: 30.1 pg (ref 26.0–34.0)
MCHC: 32.6 g/dL (ref 32.0–36.0)
MCV: 92.3 fL (ref 80.0–100.0)
MONO ABS: 0.1 10*3/uL — AB (ref 0.2–0.9)
Monocytes Relative: 1 %
NEUTROS PCT: 96 %
Neutro Abs: 14.4 10*3/uL — ABNORMAL HIGH (ref 1.4–6.5)
Platelets: 345 10*3/uL (ref 150–440)
RBC: 3.68 MIL/uL — ABNORMAL LOW (ref 3.80–5.20)
RDW: 16.6 % — AB (ref 11.5–14.5)
WBC: 15.1 10*3/uL — ABNORMAL HIGH (ref 3.6–11.0)

## 2015-02-19 LAB — BASIC METABOLIC PANEL WITH GFR
Anion gap: 6 (ref 5–15)
BUN: 18 mg/dL (ref 6–20)
CO2: 24 mmol/L (ref 22–32)
Calcium: 8.9 mg/dL (ref 8.9–10.3)
Chloride: 105 mmol/L (ref 101–111)
Creatinine, Ser: 0.71 mg/dL (ref 0.44–1.00)
GFR calc Af Amer: >60 mL/min
GFR calc non Af Amer: >60 mL/min
Glucose, Bld: 146 mg/dL — ABNORMAL HIGH (ref 65–99)
Potassium: 3.5 mmol/L (ref 3.5–5.1)
Sodium: 135 mmol/L (ref 135–145)

## 2015-02-19 MED ORDER — DIPHENHYDRAMINE HCL 50 MG/ML IJ SOLN
25.0000 mg | Freq: Once | INTRAMUSCULAR | Status: AC
  Administered 2015-02-19: 25 mg via INTRAVENOUS
  Filled 2015-02-19: qty 1

## 2015-02-19 MED ORDER — HEPARIN SOD (PORK) LOCK FLUSH 100 UNIT/ML IV SOLN
500.0000 [IU] | Freq: Once | INTRAVENOUS | Status: AC | PRN
  Administered 2015-02-19: 500 [IU]
  Filled 2015-02-19: qty 5

## 2015-02-19 MED ORDER — PALONOSETRON HCL INJECTION 0.25 MG/5ML
0.2500 mg | Freq: Once | INTRAVENOUS | Status: AC
  Administered 2015-02-19: 0.25 mg via INTRAVENOUS
  Filled 2015-02-19: qty 5

## 2015-02-19 MED ORDER — SODIUM CHLORIDE 0.9 % IV SOLN
Freq: Once | INTRAVENOUS | Status: AC
  Administered 2015-02-19: 11:00:00 via INTRAVENOUS
  Filled 2015-02-19: qty 5

## 2015-02-19 MED ORDER — CARBOPLATIN CHEMO INJECTION 600 MG/60ML
550.5000 mg | Freq: Once | INTRAVENOUS | Status: AC
  Administered 2015-02-19: 550.5 mg via INTRAVENOUS
  Filled 2015-02-19: qty 55.05

## 2015-02-19 MED ORDER — SODIUM CHLORIDE 0.9 % IV SOLN
Freq: Once | INTRAVENOUS | Status: DC
  Filled 2015-02-19: qty 8

## 2015-02-19 MED ORDER — DEXTROSE 5 % IV SOLN
75.0000 mg/m2 | Freq: Once | INTRAVENOUS | Status: AC
  Administered 2015-02-19: 140 mg via INTRAVENOUS
  Filled 2015-02-19: qty 14

## 2015-02-19 MED ORDER — SODIUM CHLORIDE 0.9 % IV SOLN
420.0000 mg | Freq: Once | INTRAVENOUS | Status: AC
  Administered 2015-02-19: 420 mg via INTRAVENOUS
  Filled 2015-02-19: qty 14

## 2015-02-19 MED ORDER — SODIUM CHLORIDE 0.9 % IV SOLN
Freq: Once | INTRAVENOUS | Status: AC
  Administered 2015-02-19: 10:00:00 via INTRAVENOUS
  Filled 2015-02-19: qty 250

## 2015-02-19 MED ORDER — TRASTUZUMAB CHEMO INJECTION 440 MG
440.0000 mg | Freq: Once | INTRAVENOUS | Status: AC
  Administered 2015-02-19: 440 mg via INTRAVENOUS
  Filled 2015-02-19: qty 20.95

## 2015-02-19 MED ORDER — ACETAMINOPHEN 325 MG PO TABS
650.0000 mg | ORAL_TABLET | Freq: Once | ORAL | Status: AC
  Administered 2015-02-19: 650 mg via ORAL
  Filled 2015-02-19: qty 2

## 2015-02-19 MED ORDER — SODIUM CHLORIDE 0.9 % IJ SOLN
10.0000 mL | INTRAMUSCULAR | Status: DC | PRN
  Administered 2015-02-19: 10 mL
  Filled 2015-02-19: qty 10

## 2015-02-19 MED ORDER — FAMOTIDINE IN NACL 20-0.9 MG/50ML-% IV SOLN
20.0000 mg | Freq: Two times a day (BID) | INTRAVENOUS | Status: DC
  Administered 2015-02-19: 20 mg via INTRAVENOUS
  Filled 2015-02-19: qty 50

## 2015-02-21 ENCOUNTER — Inpatient Hospital Stay: Payer: BLUE CROSS/BLUE SHIELD

## 2015-02-21 VITALS — BP 132/77 | HR 77 | Temp 97.1°F | Resp 20

## 2015-02-21 DIAGNOSIS — C50412 Malignant neoplasm of upper-outer quadrant of left female breast: Secondary | ICD-10-CM | POA: Diagnosis not present

## 2015-02-21 MED ORDER — PEGFILGRASTIM INJECTION 6 MG/0.6ML ~~LOC~~
6.0000 mg | PREFILLED_SYRINGE | Freq: Once | SUBCUTANEOUS | Status: AC
Start: 2015-02-21 — End: 2015-02-21
  Administered 2015-02-21: 6 mg via SUBCUTANEOUS

## 2015-02-25 ENCOUNTER — Emergency Department
Admission: EM | Admit: 2015-02-25 | Discharge: 2015-02-25 | Disposition: A | Discharge status: Home / Self Care | Payer: BLUE CROSS/BLUE SHIELD | Attending: Emergency Medicine | Admitting: Emergency Medicine

## 2015-02-25 ENCOUNTER — Emergency Department: Payer: BLUE CROSS/BLUE SHIELD

## 2015-02-25 ENCOUNTER — Encounter: Payer: Self-pay | Admitting: Emergency Medicine

## 2015-02-25 DIAGNOSIS — Z79899 Other long term (current) drug therapy: Secondary | ICD-10-CM | POA: Insufficient documentation

## 2015-02-25 DIAGNOSIS — Z5111 Encounter for antineoplastic chemotherapy: Secondary | ICD-10-CM | POA: Insufficient documentation

## 2015-02-25 DIAGNOSIS — Z720 Tobacco use: Secondary | ICD-10-CM | POA: Diagnosis not present

## 2015-02-25 DIAGNOSIS — R5383 Other fatigue: Secondary | ICD-10-CM | POA: Insufficient documentation

## 2015-02-25 DIAGNOSIS — M6281 Muscle weakness (generalized): Secondary | ICD-10-CM | POA: Diagnosis present

## 2015-02-25 DIAGNOSIS — E871 Hypo-osmolality and hyponatremia: Secondary | ICD-10-CM | POA: Insufficient documentation

## 2015-02-25 DIAGNOSIS — Z7982 Long term (current) use of aspirin: Secondary | ICD-10-CM | POA: Diagnosis not present

## 2015-02-25 DIAGNOSIS — I10 Essential (primary) hypertension: Secondary | ICD-10-CM | POA: Insufficient documentation

## 2015-02-25 DIAGNOSIS — C50412 Malignant neoplasm of upper-outer quadrant of left female breast: Secondary | ICD-10-CM | POA: Insufficient documentation

## 2015-02-25 LAB — CBC WITH DIFFERENTIAL/PLATELET
BASOS ABS: 0 10*3/uL (ref 0–0.1)
Basophils Relative: 1 %
EOS ABS: 0 10*3/uL (ref 0–0.7)
Eosinophils Relative: 1 %
HEMATOCRIT: 34.9 % — AB (ref 35.0–47.0)
HEMOGLOBIN: 11.4 g/dL — AB (ref 12.0–16.0)
Lymphocytes Relative: 42 %
Lymphs Abs: 1.7 10*3/uL (ref 1.0–3.6)
MCH: 30.1 pg (ref 26.0–34.0)
MCHC: 32.6 g/dL (ref 32.0–36.0)
MCV: 92.5 fL (ref 80.0–100.0)
MONO ABS: 0.3 10*3/uL (ref 0.2–0.9)
Monocytes Relative: 7 %
Neutro Abs: 2 10*3/uL (ref 1.4–6.5)
Neutrophils Relative %: 49 %
Platelets: 168 10*3/uL (ref 150–440)
RBC: 3.77 MIL/uL — ABNORMAL LOW (ref 3.80–5.20)
RDW: 16.7 % — ABNORMAL HIGH (ref 11.5–14.5)
WBC: 4 10*3/uL (ref 3.6–11.0)

## 2015-02-25 LAB — URINALYSIS COMPLETE WITH MICROSCOPIC (ARMC ONLY)
BILIRUBIN URINE: NEGATIVE
GLUCOSE, UA: NEGATIVE mg/dL
Hgb urine dipstick: NEGATIVE
KETONES UR: NEGATIVE mg/dL
Nitrite: NEGATIVE
PH: 6 (ref 5.0–8.0)
Protein, ur: NEGATIVE mg/dL
Specific Gravity, Urine: 1.02 (ref 1.005–1.030)

## 2015-02-25 LAB — COMPREHENSIVE METABOLIC PANEL
ALBUMIN: 4 g/dL (ref 3.5–5.0)
ALT: 41 U/L (ref 14–54)
AST: 24 U/L (ref 15–41)
Alkaline Phosphatase: 77 U/L (ref 38–126)
Anion gap: 9 (ref 5–15)
BILIRUBIN TOTAL: 0.7 mg/dL (ref 0.3–1.2)
BUN: 23 mg/dL — ABNORMAL HIGH (ref 6–20)
CO2: 28 mmol/L (ref 22–32)
Calcium: 8.6 mg/dL — ABNORMAL LOW (ref 8.9–10.3)
Chloride: 90 mmol/L — ABNORMAL LOW (ref 101–111)
Creatinine, Ser: 0.71 mg/dL (ref 0.44–1.00)
GFR calc Af Amer: >60 mL/min (ref >60)
GFR calc non Af Amer: >60 mL/min (ref >60)
Glucose, Bld: 93 mg/dL (ref 65–99)
POTASSIUM: 3.6 mmol/L (ref 3.5–5.1)
Sodium: 127 mmol/L — ABNORMAL LOW (ref 135–145)
Total Protein: 7 g/dL (ref 6.5–8.1)

## 2015-02-25 LAB — TROPONIN I

## 2015-02-25 MED ORDER — SODIUM CHLORIDE 0.9 % IV SOLN
Freq: Once | INTRAVENOUS | Status: AC
  Administered 2015-02-25: 16:00:00 via INTRAVENOUS

## 2015-02-25 MED ORDER — HEPARIN SOD (PORK) LOCK FLUSH 100 UNIT/ML IV SOLN: 500.0000 [IU] | INTRAVENOUS | Status: DC | PRN

## 2015-02-25 MED ORDER — SODIUM CHLORIDE 0.9 % IJ SOLN: 10.0000 mL | INTRAMUSCULAR | Status: DC | PRN

## 2015-02-25 MED ORDER — SODIUM CHLORIDE 0.9 % IV SOLN: Freq: Once | INTRAVENOUS | Status: DC

## 2015-02-25 MED ORDER — SULFAMETHOXAZOLE-TRIMETHOPRIM 800-160 MG PO TABS: 1.0000 | ORAL_TABLET | Freq: Two times a day (BID) | ORAL | Status: DC

## 2015-02-25 MED ORDER — HEPARIN SOD (PORK) LOCK FLUSH 100 UNIT/ML IV SOLN
INTRAVENOUS | Status: AC
  Filled 2015-02-25: qty 5

## 2015-02-25 NOTE — ED Notes (Signed)
Pt discharged home after verbalizing understanding of discharge instructions; nad noted. 

## 2015-02-25 NOTE — ED Provider Notes (Addendum)
[facility] Emergency Department Provider Note     Time seen: ----------------------------------------- 3:19 PM on 02/25/2015 -----------------------------------------    I have reviewed the triage vital signs and the nursing notes.   HISTORY  Chief Complaint Weakness    HPI Sarah Horne is a 59 y.o. female who presents ER after feeling poorly since she had a chemotherapy injection to stimulate her white blood cell count. Patient states she has chemotherapy last Thursday and then she had a shot by Dr. Ma Hillock thereafter and she's feeling poorly since that time. She denies any fevers chills chest pain shortness of breath, nausea vomiting or diarrhea. Patient states she's been sleeping for last 4 days, nothing makes her symptoms better or worse. Symptoms are moderate to severe with fatigue. Has not had this happen before. This is her third chemotherapy treatment.  Past Medical History  Diagnosis Date  . Hypertension   . Hyperlipidemia   . Heart attack 1998    Patient Active Problem List   Diagnosis Date Noted  . Breast cancer of upper-outer quadrant of left female breast 12/27/2014    Past Surgical History  Procedure Laterality Date  . Breast surgery Left 12/20/14    breast biopsy    Current Outpatient Rx  Name  Route  Sig  Dispense  Refill  . aspirin 325 MG tablet   Oral   Take 325 mg by mouth daily.         Marland Kitchen atenolol (TENORMIN) 25 MG tablet               . dexamethasone (DECADRON) 4 MG tablet   Oral   Take 8 mg by mouth 2 (two) times daily. Take two tablets twice daily x 3 days starting on the day before each dose of Taxotere chemotherapy         . HYDROcodone-acetaminophen (NORCO/VICODIN) 5-325 MG per tablet   Oral   Take 1 tablet by mouth every 4 (four) hours as needed for moderate pain.         Marland Kitchen lidocaine-prilocaine (EMLA) cream      Apply to affected area once   30 g   3   . lisinopril (PRINIVIL,ZESTRIL) 10 MG  tablet               . simvastatin (ZOCOR) 40 MG tablet                 Allergies No known allergies  Family History  Problem Relation Age of Onset  . Breast cancer Maternal Aunt   . Breast cancer Cousin   . Brain cancer Maternal Uncle     Social History History  Substance Use Topics  . Smoking status: Current Every Day Smoker -- 1.00 packs/day for 18 years    Types: Cigarettes  . Smokeless tobacco: Not on file  . Alcohol Use: No    Review of Systems Constitutional: Negative for fever.  Eyes: Negative for visual changes. ENT: Negative for sore throat. Cardiovascular: Negative for chest pain. Respiratory: Negative for shortness of breath. Gastrointestinal: Negative for abdominal pain, vomiting and diarrhea. Genitourinary: Negative for dysuria. Musculoskeletal: Negative for back pain. Skin: Negative for rash. Neurological: Negative for headaches, positive for generalized weakness  10-point ROS otherwise negative.  ____________________________________________   PHYSICAL EXAM:  VITAL SIGNS: ED Triage Vitals  Enc Vitals Group     BP 02/25/15 1440 158/89 mmHg     Pulse Rate 02/25/15 1440 131     Resp 02/25/15 1440 18  Temp 02/25/15 1440 98.9 F (37.2 C)     Temp Source 02/25/15 1440 Oral     SpO2 02/25/15 1440 98 %     Weight 02/25/15 1440 162 lb (73.483 kg)     Height 02/25/15 1440 '5\' 6"'$  (1.676 m)     Head Cir --      Peak Flow --      Pain Score 02/25/15 1442 0     Pain Loc --      Pain Edu? --      Excl. in [place]? --     Constitutional: Alert and oriented. Well appearing and in no distress. Eyes: Conjunctivae are normal. PERRL. Normal extraocular movements. ENT   Head: Normocephalic and atraumatic.   Nose: No congestion/rhinnorhea.   Mouth/Throat: Mucous membranes are moist.   Neck: No stridor. Hematological/Lymphatic/Immunilogical: No cervical lymphadenopathy. Cardiovascular: Normal rate, regular rhythm. Normal and symmetric  distal pulses are present in all extremities. No murmurs, rubs, or gallops. Respiratory: Normal respiratory effort without tachypnea nor retractions. Breath sounds are clear and equal bilaterally. No wheezes/rales/rhonchi. Gastrointestinal: Soft and nontender. No distention. No abdominal bruits. There is no CVA tenderness. Musculoskeletal: Nontender with normal range of motion in all extremities. No joint effusions.  No lower extremity tenderness nor edema. Neurologic:  Normal speech and language. No gross focal neurologic deficits are appreciated. Speech is normal. No gait instability. Skin:  Skin is warm, dry and intact. No rash noted. Psychiatric: Mood and affect are normal. Speech and behavior are normal. Patient exhibits appropriate insight and judgment.  ____________________________________________    LABS (pertinent positives/negatives)  Labs Reviewed  CBC WITH DIFFERENTIAL/PLATELET - Abnormal; Notable for the following:    RBC 3.77 (*)    Hemoglobin 11.4 (*)    HCT 34.9 (*)    RDW 16.7 (*)    All other components within normal limits  COMPREHENSIVE METABOLIC PANEL - Abnormal; Notable for the following:    Sodium 127 (*)    Chloride 90 (*)    BUN 23 (*)    Calcium 8.6 (*)    All other components within normal limits  TROPONIN I  URINALYSIS COMPLETEWITH MICROSCOPIC ([facility] ONLY)    EKG: Interpreted by me. His tachycardia with a rate of 109, normal axis normal intervals, no evidence of hypertrophy or acute infarction.  ____________________________________________  ED COURSE:  Pertinent labs & imaging results that were available during my care of the patient were reviewed by me and considered in my medical decision making (see chart for details).  She'll receive normal saline, will check basic labs. ____________________________________________   RADIOLOGY  Chest x-ray IMPRESSION: No active cardiopulmonary  disease. ____________________________________________    FINAL ASSESSMENT AND PLAN  Weakness and breast cancer, hyponatremia, cystitis   Plan: Patient has received IV fluid rehydration with normal saline. States feels much better. Advised her to increase intake of electrolytes and sodium. She'll need repeat the next few days of her labs. Patient will also be placed on Bactrim for UTI.    Earleen Newport, MD   Earleen Newport, MD 02/25/15 1811  Earleen Newport, MD 02/25/15 [phone]

## 2015-02-25 NOTE — Discharge Instructions (Signed)
Hyponatremia  °Hyponatremia is when the amount of salt (sodium) in your blood is too low. When sodium levels are low, your cells will absorb extra water and swell. The swelling happens throughout the body, but it mostly affects the brain. Severe brain swelling (cerebral edema), seizures, or coma can happen.  °CAUSES  °· Heart, kidney, or liver problems. °· Thyroid problems. °· Adrenal gland problems. °· Severe vomiting and diarrhea. °· Certain medicines or illegal drugs. °· Dehydration. °· Drinking too much water. °· Low-sodium diet. °SYMPTOMS  °· Nausea and vomiting. °· Confusion. °· Lethargy. °· Agitation. °· Headache. °· Twitching or shaking (seizures). °· Unconsciousness. °· Appetite loss. °· Muscle weakness and cramping. °DIAGNOSIS  °Hyponatremia is identified by a simple blood test. Your caregiver will perform a history and physical exam to try to find the cause and type of hyponatremia. Other tests may be needed to measure the amount of sodium in your blood and urine. °TREATMENT  °Treatment will depend on the cause.  °· Fluids may be given through the vein (IV). °· Medicines may be used to correct the sodium imbalance. If medicines are causing the problem, they will need to be adjusted. °· Water or fluid intake may be restricted to restore proper balance. °The speed of correcting the sodium problem is very important. If the problem is corrected too fast, nerve damage (sometimes unchangeable) can happen. °HOME CARE INSTRUCTIONS  °· Only take medicines as directed by your caregiver. Many medicines can make hyponatremia worse. Discuss all your medicines with your caregiver. °· Carefully follow any recommended diet, including any fluid restrictions. °· You may be asked to repeat lab tests. Follow these directions. °· Avoid alcohol and recreational drugs. °SEEK MEDICAL CARE IF:  °· You develop worsening nausea, fatigue, headache, confusion, or weakness. °· Your original hyponatremia symptoms return. °· You have  problems following the recommended diet. °SEEK IMMEDIATE MEDICAL CARE IF:  °· You have a seizure. °· You faint. °· You have ongoing diarrhea or vomiting. °MAKE SURE YOU:  °· Understand these instructions. °· Will watch your condition. °· Will get help right away if you are not doing well or get worse. °Document Released: 09/04/2002 Document Revised: 12/07/2011 Document Reviewed: 03/01/2011 °[employer]® Patient Information ©2015 [employer]. This information is not intended to replace advice given to you by your health care provider. Make sure you discuss any questions you have with your health care provider. ° °

## 2015-02-25 NOTE — ED Notes (Signed)
Pt states she had a chemo injection last Thursday and since has been having increased weakness, denies any n,v, states she called and spoke to [facility] nurse and advised to come to ED for further evaluation

## 2015-02-26 ENCOUNTER — Inpatient Hospital Stay: Payer: BLUE CROSS/BLUE SHIELD

## 2015-02-26 ENCOUNTER — Other Ambulatory Visit: Payer: Self-pay | Admitting: Family Medicine

## 2015-02-26 VITALS — BP 82/63 | HR 109 | Temp 96.9°F | Resp 20

## 2015-02-26 DIAGNOSIS — C50412 Malignant neoplasm of upper-outer quadrant of left female breast: Secondary | ICD-10-CM | POA: Diagnosis not present

## 2015-02-26 MED ORDER — HEPARIN SOD (PORK) LOCK FLUSH 100 UNIT/ML IV SOLN
500.0000 [IU] | Freq: Once | INTRAVENOUS | Status: AC
  Administered 2015-02-26: 500 [IU] via INTRAVENOUS
  Filled 2015-02-26: qty 5

## 2015-02-26 MED ORDER — DEXAMETHASONE SODIUM PHOSPHATE 10 MG/ML IJ SOLN
10.0000 mg | Freq: Once | INTRAMUSCULAR | Status: AC
  Administered 2015-02-26: 10 mg via INTRAVENOUS
  Filled 2015-02-26: qty 1

## 2015-02-26 MED ORDER — SODIUM CHLORIDE 0.9 % IJ SOLN
10.0000 mL | INTRAMUSCULAR | Status: DC | PRN
  Administered 2015-02-26: 10 mL via INTRAVENOUS
  Filled 2015-02-26: qty 10

## 2015-02-26 MED ORDER — SODIUM CHLORIDE 0.9 % IV SOLN
Freq: Once | INTRAVENOUS | Status: AC
Start: 2015-02-26 — End: 2015-02-26
  Administered 2015-02-26: 11:00:00 via INTRAVENOUS
  Filled 2015-02-26: qty 1000

## 2015-03-05 ENCOUNTER — Ambulatory Visit: Payer: BLUE CROSS/BLUE SHIELD | Admitting: *Deleted

## 2015-03-05 ENCOUNTER — Inpatient Hospital Stay: Payer: BLUE CROSS/BLUE SHIELD | Attending: Internal Medicine

## 2015-03-05 VITALS — BP 104/67 | HR 103 | Temp 97.6°F

## 2015-03-05 DIAGNOSIS — R600 Localized edema: Secondary | ICD-10-CM | POA: Diagnosis not present

## 2015-03-05 DIAGNOSIS — R5383 Other fatigue: Secondary | ICD-10-CM | POA: Insufficient documentation

## 2015-03-05 DIAGNOSIS — Z5111 Encounter for antineoplastic chemotherapy: Secondary | ICD-10-CM | POA: Insufficient documentation

## 2015-03-05 DIAGNOSIS — G620 Drug-induced polyneuropathy: Secondary | ICD-10-CM | POA: Diagnosis not present

## 2015-03-05 DIAGNOSIS — E860 Dehydration: Secondary | ICD-10-CM | POA: Diagnosis not present

## 2015-03-05 DIAGNOSIS — I252 Old myocardial infarction: Secondary | ICD-10-CM | POA: Insufficient documentation

## 2015-03-05 DIAGNOSIS — T451X5S Adverse effect of antineoplastic and immunosuppressive drugs, sequela: Secondary | ICD-10-CM | POA: Insufficient documentation

## 2015-03-05 DIAGNOSIS — R531 Weakness: Secondary | ICD-10-CM | POA: Diagnosis not present

## 2015-03-05 DIAGNOSIS — C50912 Malignant neoplasm of unspecified site of left female breast: Secondary | ICD-10-CM | POA: Diagnosis not present

## 2015-03-05 DIAGNOSIS — E785 Hyperlipidemia, unspecified: Secondary | ICD-10-CM | POA: Diagnosis not present

## 2015-03-05 DIAGNOSIS — Z7982 Long term (current) use of aspirin: Secondary | ICD-10-CM | POA: Insufficient documentation

## 2015-03-05 DIAGNOSIS — Z79899 Other long term (current) drug therapy: Secondary | ICD-10-CM | POA: Diagnosis not present

## 2015-03-05 DIAGNOSIS — I10 Essential (primary) hypertension: Secondary | ICD-10-CM | POA: Diagnosis not present

## 2015-03-05 DIAGNOSIS — C50412 Malignant neoplasm of upper-outer quadrant of left female breast: Secondary | ICD-10-CM

## 2015-03-05 DIAGNOSIS — K769 Liver disease, unspecified: Secondary | ICD-10-CM | POA: Insufficient documentation

## 2015-03-05 DIAGNOSIS — Z803 Family history of malignant neoplasm of breast: Secondary | ICD-10-CM | POA: Insufficient documentation

## 2015-03-05 DIAGNOSIS — F17210 Nicotine dependence, cigarettes, uncomplicated: Secondary | ICD-10-CM | POA: Diagnosis not present

## 2015-03-05 DIAGNOSIS — C773 Secondary and unspecified malignant neoplasm of axilla and upper limb lymph nodes: Secondary | ICD-10-CM | POA: Insufficient documentation

## 2015-03-05 MED ORDER — SODIUM CHLORIDE 0.9 % IV SOLN
Freq: Once | INTRAVENOUS | Status: AC
  Administered 2015-03-05: 16:00:00 via INTRAVENOUS
  Filled 2015-03-05: qty 1000

## 2015-03-05 NOTE — Progress Notes (Signed)
Pt came in today for a fluid bolus.Marland KitchenMarland Kitchen

## 2015-03-07 NOTE — Progress Notes (Signed)
Sarah Horne  [phone] [phone]     ID: Sarah Horne OB: October 31, 1955  MR#: [id number]  XQJ#:[id number]  Patient Care Team: [name], MD as PCP - General (Family Medicine) [name], MD (General Surgery) [name], MD as Attending Physician (Internal Medicine)  CHIEF COMPLAINT/DIAGNOSIS:   Invasive ductal carcinoma of the left breast grade 2 on biopsy  (clinically T2N1Mx, stage II disease). Tumor size 4.3 x 4.0 x 3.4 cm. Left axillary lymph node measuring 2.7 cm. Left axillary lymph node biopsy positive for involvement by poorly differentiated carcinoma consistent with breast primary, left breast mass biopsy showed invasive ductal carcinoma grade 2, biopsy done on 12/18/2014. ER positive greater than 90%, PR positive 51% to 90%, HER2/neu positive (3+ on IHC). PET scan 12/31/14 - IMPRESSION: 1. Hypermetabolic left breast mass consists with primary carcinoma. 2. Evidence of local nodal metastasis to the left axilla. 3. No evidence of central nodal metastasis to mediastinum. 4. Two lesions in liver with mild metabolic activity. Differential would include focal nodular hyperplasia versus metastatic breast cancer. Recommend either a contrast-enhanced liver MRI or for added specificity ultrasound-guided hepatic biopsy. CT-guided liver biopsy 01/04/15 - negative for malignancy.  Patient started on Neoadjuvant chemotherapy with Taxotere/Carboplatin/Herceptin/Perjeta on 01/08/15.  HISTORY OF PRESENT ILLNESS:  patient here for scheduled oncology evaluation and consideration of cycle 3 of chemotherapy. She is tolerating her treatments well without significant side effects. She denies any recent fevers. She has no neurologic complaints. She denies any chest pain or shortness of breath. She denies any nausea, vomiting, constipation, or diarrhea. She continues to have occasional arthritis and low back pain. Patient offers no specific complaints today.     REVIEW OF SYSTEMS:   Review of Systems  Constitutional: Negative.   All other systems reviewed and are negative. PS ECOG 1.  PAST MEDICAL HISTORY: Past Medical History  Diagnosis Date  . Hypertension   . Hyperlipidemia   . Heart attack 1998    PAST SURGICAL HISTORY: Past Surgical History  Procedure Laterality Date  . Breast surgery Left 12/20/14    breast biopsy    FAMILY HISTORY Family History  Problem Relation Age of Onset  . Breast cancer Maternal Aunt   . Breast cancer Cousin   . Brain cancer Maternal Uncle    SOCIAL HISTORY: History  Substance Use Topics  . Smoking status: Current Every Day Smoker -- 1.00 packs/day for 18 years    Types: Cigarettes  . Smokeless tobacco: Not on file  . Alcohol Use: No     Allergies  Allergen Reactions  . No Known Allergies     Current Outpatient Prescriptions  Medication Sig Dispense Refill  . aspirin 325 MG tablet Take 325 mg by mouth daily.    Marland Kitchen atenolol (TENORMIN) 25 MG tablet     . dexamethasone (DECADRON) 4 MG tablet Take 8 mg by mouth 2 (two) times daily. Take two tablets twice daily x 3 days starting on the day before each dose of Taxotere chemotherapy    . HYDROcodone-acetaminophen (NORCO/VICODIN) 5-325 MG per tablet Take 1 tablet by mouth every 4 (four) hours as needed for moderate pain.    Marland Kitchen lidocaine-prilocaine (EMLA) cream Apply to affected area once 30 g 3  . lisinopril (PRINIVIL,ZESTRIL) 10 MG tablet     . simvastatin (ZOCOR) 40 MG tablet     . sulfamethoxazole-trimethoprim (BACTRIM DS) 800-160 MG per tablet Take 1 tablet by mouth 2 (two) times daily. 20 tablet 0  No current facility-administered medications for this visit.   Facility-Administered Medications Ordered in Other Visits  Medication Dose Route Frequency Provider Last Rate Last Dose  . famotidine (PEPCID) IVPB 20 mg  20 mg Intravenous Q12H [name], MD   20 mg at 01/29/15 1230    OBJECTIVE: Filed Vitals:   02/19/15 0937  BP:  126/79  Pulse: 72  Temp: 97.4 F (36.3 C)  Resp: 20     Body mass index is 27.15 kg/(m^2).    ECOG FS:1 - Symptomatic but completely ambulatory  General: Well-developed, well-nourished, no acute distress. Breasts: Exam deferred today Lungs: Clear to auscultation bilaterally. Heart: Regular rate and rhythm. No rubs, murmurs, or gallops. Abdomen: Soft, nontender, no organomegaly noted  Ext: No edema, cyanosis, or clubbing. Neuro: Alert, answering all questions appropriately. Cranial nerves grossly intact. Skin: No rash or petechiae noted.  LAB RESULTS: Reviewed from today           Lab Results  Component Value Date   LABCA2 48.8* 12/25/2014    ASSESSMENT/PLAN:   1. Invasive ductal carcinoma of the left breast grade 2 on biopsy  (clinically T2N1Mx, stage II disease). Tumor size 4.3 x 4.0 x 3.4 cm. Left axillary lymph node measuring 2.7 cm. Left axillary lymph node biopsy positive for involvement by poorly differentiated carcinoma consistent with breast primary, left breast mass biopsy showed invasive ductal carcinoma grade 2, biopsy done on 12/18/2014. ER positive greater than 90%, PR positive 51% to 90%, HER2/neu positive (3+ on IHC). CT-guided liver biopsy 01/04/15 negative for malignancy. Patient started on Neoadjuvant chemotherapy with Taxotere/Carboplatin/Herceptin/Perjeta today (01/08/15) - overall tolerated cycle 1 chemo fairly well.. Breast mass smaller on exam today. Will continue on current chemo with Taxotere/Carboplatin/Herceptin/Perjeta (given every 3 weeks 6 cycles, and then postoperatively she will complete Herceptin course of total 52 weeks). Have reviewed labs, proceed with cycle 3 of Taxotere 75 mg/m IV, Carboplatin AUC of 6 IV, Herceptin 44m/kg IV, Perjeta 420 mg IV today. Neulasta inj 6 mg SQ on day 2 given higher risk of febrile neutropenia and myelosuppression (20% or higher). Monitor weekly labs and follow-up in 3 weeks to plan cycle 4 treatment. After neoadjuvant  chemotherapy, she will go for surgery and if she has breast conservation surgery she will then receive radiation therapy, to be followed by adjuvant homornal therapy given ER/PR positive disease.  2. Leukocytosis - likely Decadron effect. No fevers or symptoms of infection. 3. Pain - no issues at this time. 4. Nutrition - encouraged to maintain an adequate protein-calorie intake. 5. In between visits, patient advised to call or come to the ER in case of fevers, bleeding, acute sickness or new symptoms. She is agreeable to above plan.  TLloyd Huger MD   03/07/2015 10:41 AM

## 2015-03-10 ENCOUNTER — Other Ambulatory Visit: Payer: Self-pay | Admitting: *Deleted

## 2015-03-10 DIAGNOSIS — C50412 Malignant neoplasm of upper-outer quadrant of left female breast: Secondary | ICD-10-CM

## 2015-03-11 ENCOUNTER — Other Ambulatory Visit: Payer: Self-pay | Admitting: Family Medicine

## 2015-03-11 NOTE — Telephone Encounter (Signed)
Patient needs refill on lisinopril and atenelol to be sent to [facility].

## 2015-03-12 ENCOUNTER — Other Ambulatory Visit: Payer: Self-pay

## 2015-03-12 ENCOUNTER — Inpatient Hospital Stay (HOSPITAL_BASED_OUTPATIENT_CLINIC_OR_DEPARTMENT_OTHER): Payer: BLUE CROSS/BLUE SHIELD | Admitting: Hematology and Oncology

## 2015-03-12 ENCOUNTER — Inpatient Hospital Stay: Payer: BLUE CROSS/BLUE SHIELD

## 2015-03-12 ENCOUNTER — Ambulatory Visit: Payer: BLUE CROSS/BLUE SHIELD

## 2015-03-12 VITALS — BP 152/83 | HR 92 | Temp 96.7°F | Ht 66.0 in | Wt 162.3 lb

## 2015-03-12 DIAGNOSIS — C50412 Malignant neoplasm of upper-outer quadrant of left female breast: Secondary | ICD-10-CM

## 2015-03-12 DIAGNOSIS — R531 Weakness: Secondary | ICD-10-CM

## 2015-03-12 DIAGNOSIS — E785 Hyperlipidemia, unspecified: Secondary | ICD-10-CM

## 2015-03-12 DIAGNOSIS — T451X5S Adverse effect of antineoplastic and immunosuppressive drugs, sequela: Secondary | ICD-10-CM

## 2015-03-12 DIAGNOSIS — C50912 Malignant neoplasm of unspecified site of left female breast: Secondary | ICD-10-CM

## 2015-03-12 DIAGNOSIS — C773 Secondary and unspecified malignant neoplasm of axilla and upper limb lymph nodes: Secondary | ICD-10-CM | POA: Diagnosis not present

## 2015-03-12 DIAGNOSIS — Z79899 Other long term (current) drug therapy: Secondary | ICD-10-CM

## 2015-03-12 DIAGNOSIS — K769 Liver disease, unspecified: Secondary | ICD-10-CM

## 2015-03-12 DIAGNOSIS — I10 Essential (primary) hypertension: Secondary | ICD-10-CM

## 2015-03-12 DIAGNOSIS — G620 Drug-induced polyneuropathy: Secondary | ICD-10-CM | POA: Diagnosis not present

## 2015-03-12 DIAGNOSIS — I252 Old myocardial infarction: Secondary | ICD-10-CM

## 2015-03-12 DIAGNOSIS — F17210 Nicotine dependence, cigarettes, uncomplicated: Secondary | ICD-10-CM

## 2015-03-12 DIAGNOSIS — R5383 Other fatigue: Secondary | ICD-10-CM

## 2015-03-12 LAB — CBC WITH DIFFERENTIAL/PLATELET
BASOS ABS: 0 10*3/uL (ref 0–0.1)
BASOS PCT: 0 %
Eosinophils Absolute: 0 10*3/uL (ref 0–0.7)
Eosinophils Relative: 0 %
HCT: 32.9 % — ABNORMAL LOW (ref 35.0–47.0)
HEMOGLOBIN: 10.8 g/dL — AB (ref 12.0–16.0)
LYMPHS PCT: 4 %
Lymphs Abs: 0.5 10*3/uL — ABNORMAL LOW (ref 1.0–3.6)
MCH: 30.8 pg (ref 26.0–34.0)
MCHC: 32.8 g/dL (ref 32.0–36.0)
MCV: 93.9 fL (ref 80.0–100.0)
MONO ABS: 0.2 10*3/uL (ref 0.2–0.9)
Monocytes Relative: 2 %
Neutro Abs: 10.6 10*3/uL — ABNORMAL HIGH (ref 1.4–6.5)
Neutrophils Relative %: 94 %
Platelets: 286 10*3/uL (ref 150–440)
RBC: 3.5 MIL/uL — AB (ref 3.80–5.20)
RDW: 18.9 % — ABNORMAL HIGH (ref 11.5–14.5)
WBC: 11.3 10*3/uL — ABNORMAL HIGH (ref 3.6–11.0)

## 2015-03-12 LAB — COMPREHENSIVE METABOLIC PANEL
ALT: 27 U/L (ref 14–54)
AST: 29 U/L (ref 15–41)
Albumin: 3.9 g/dL (ref 3.5–5.0)
Alkaline Phosphatase: 61 U/L (ref 38–126)
Anion gap: 7 (ref 5–15)
BUN: 18 mg/dL (ref 6–20)
CHLORIDE: 105 mmol/L (ref 101–111)
CO2: 26 mmol/L (ref 22–32)
Calcium: 8.7 mg/dL — ABNORMAL LOW (ref 8.9–10.3)
Creatinine, Ser: 1 mg/dL (ref 0.44–1.00)
GFR, EST NON AFRICAN AMERICAN: 60 mL/min — AB (ref >60)
GLUCOSE: 130 mg/dL — AB (ref 65–99)
POTASSIUM: 4 mmol/L (ref 3.5–5.1)
SODIUM: 138 mmol/L (ref 135–145)
Total Bilirubin: 0.6 mg/dL (ref 0.3–1.2)
Total Protein: 6.3 g/dL — ABNORMAL LOW (ref 6.5–8.1)

## 2015-03-12 MED ORDER — TRASTUZUMAB CHEMO INJECTION 440 MG
440.0000 mg | Freq: Once | INTRAVENOUS | Status: AC
  Administered 2015-03-12: 440 mg via INTRAVENOUS
  Filled 2015-03-12: qty 20.95

## 2015-03-12 MED ORDER — SODIUM CHLORIDE 0.9 % IV SOLN
474.0000 mg | Freq: Once | INTRAVENOUS | Status: AC
  Administered 2015-03-12: 470 mg via INTRAVENOUS
  Filled 2015-03-12: qty 47

## 2015-03-12 MED ORDER — SIMVASTATIN 40 MG PO TABS: 40.0000 mg | ORAL_TABLET | Freq: Every day | ORAL | Status: DC

## 2015-03-12 MED ORDER — LISINOPRIL 10 MG PO TABS: 10.0000 mg | ORAL_TABLET | Freq: Every day | ORAL | Status: DC

## 2015-03-12 MED ORDER — DIPHENHYDRAMINE HCL 50 MG/ML IJ SOLN
25.0000 mg | Freq: Once | INTRAMUSCULAR | Status: AC
  Administered 2015-03-12: 25 mg via INTRAVENOUS
  Filled 2015-03-12: qty 1

## 2015-03-12 MED ORDER — FAMOTIDINE IN NACL 20-0.9 MG/50ML-% IV SOLN
20.0000 mg | Freq: Two times a day (BID) | INTRAVENOUS | Status: DC
  Administered 2015-03-12: 20 mg via INTRAVENOUS
  Filled 2015-03-12: qty 50

## 2015-03-12 MED ORDER — HEPARIN SOD (PORK) LOCK FLUSH 100 UNIT/ML IV SOLN
500.0000 [IU] | Freq: Once | INTRAVENOUS | Status: AC | PRN
  Administered 2015-03-12: 500 [IU]
  Filled 2015-03-12: qty 5

## 2015-03-12 MED ORDER — PALONOSETRON HCL INJECTION 0.25 MG/5ML
0.2500 mg | Freq: Once | INTRAVENOUS | Status: AC
  Administered 2015-03-12: 0.25 mg via INTRAVENOUS
  Filled 2015-03-12: qty 5

## 2015-03-12 MED ORDER — DOCETAXEL CHEMO INJECTION 160 MG/16ML
75.0000 mg/m2 | Freq: Once | INTRAVENOUS | Status: AC
  Administered 2015-03-12: 140 mg via INTRAVENOUS
  Filled 2015-03-12: qty 14

## 2015-03-12 MED ORDER — ATENOLOL 25 MG PO TABS: 25.0000 mg | ORAL_TABLET | Freq: Every day | ORAL | Status: DC

## 2015-03-12 MED ORDER — ACETAMINOPHEN 325 MG PO TABS
650.0000 mg | ORAL_TABLET | Freq: Once | ORAL | Status: AC
  Administered 2015-03-12: 650 mg via ORAL
  Filled 2015-03-12: qty 2

## 2015-03-12 MED ORDER — SODIUM CHLORIDE 0.9 % IV SOLN
Freq: Once | INTRAVENOUS | Status: AC
  Filled 2015-03-12: qty 250

## 2015-03-12 MED ORDER — SODIUM CHLORIDE 0.9 % IV SOLN
420.0000 mg | Freq: Once | INTRAVENOUS | Status: AC
  Administered 2015-03-12: 420 mg via INTRAVENOUS
  Filled 2015-03-12: qty 14

## 2015-03-12 MED ORDER — SODIUM CHLORIDE 0.9 % IV SOLN
Freq: Once | INTRAVENOUS | Status: AC
  Administered 2015-03-12: 11:00:00 via INTRAVENOUS
  Filled 2015-03-12: qty 5

## 2015-03-12 MED ORDER — SIMVASTATIN 40 MG PO TABS
40.0000 mg | ORAL_TABLET | Freq: Every day | ORAL | Status: DC
Start: 2015-03-12 — End: 2015-03-12

## 2015-03-12 MED ORDER — SODIUM CHLORIDE 0.9 % IV SOLN
Freq: Once | INTRAVENOUS | Status: AC
  Administered 2015-03-12: 10:00:00 via INTRAVENOUS
  Filled 2015-03-12: qty 250

## 2015-03-12 MED ORDER — SODIUM CHLORIDE 0.9 % IJ SOLN
10.0000 mL | INTRAMUSCULAR | Status: DC | PRN
  Administered 2015-03-12: 10 mL
  Filled 2015-03-12: qty 10

## 2015-03-12 MED ORDER — DEXAMETHASONE 4 MG PO TABS: 8.0000 mg | ORAL_TABLET | Freq: Two times a day (BID) | ORAL | Status: DC

## 2015-03-12 NOTE — Telephone Encounter (Signed)
    Expand All Collapse All   Patient needs refill on lisinopril and atenelol to be sent to [facility].

## 2015-03-12 NOTE — Telephone Encounter (Signed)
Refilled all meds for 90 days and patient will make appt.

## 2015-03-12 NOTE — Progress Notes (Signed)
Pt here today for follow up regarding left breast cancer; offers no complaints today; needs refill on her dexamethasone

## 2015-03-12 NOTE — Progress Notes (Signed)
[facility] day:  03/12/2015  Chief Complaint: Sarah Horne is a 59 y.o. female with clinical stage IIB left breast cancer was seen for assessment prior to cycle #4 neoadjuvant Taxotere, carboplatin, Herceptin and Perjeta.  HPI: The patient was last seen in the medical oncology clinic on 02/19/2015.  At that time, she received cycle #3 [place] +Perjeta in [place]. She received Neulasta as a 02/21/2015 in [place]. She states that she tolerated her chemotherapy well. She notes that twice since starting chemotherapy she got dehydrated and required IV fluids. She did not require fluids with her last cycle. She was in the emergency room on [facility].  She was a tired and weak. She had no vomiting. Fluids corrected her symptomatology.  Patient states that she knows when "I need it" (fluids).  She denies any bone pain associated with Neulasta. She notes some fingernail changes. Her feet are a tiny bit tingling and on a scale of 1 to 5 are a 1.   Past Medical History  Diagnosis Date  . Hypertension   . Hyperlipidemia   . Heart attack 1998  . Cancer     Past Surgical History  Procedure Laterality Date  . Breast surgery Left 12/20/14    breast biopsy    Family History  Problem Relation Age of Onset  . Breast cancer Maternal Aunt   . Breast cancer Cousin   . Brain cancer Maternal Uncle     Social History:  reports that she has been smoking Cigarettes.  She has a 9 pack-year smoking history. She does not have any smokeless tobacco history on file. She reports that she does not drink alcohol or use illicit drugs.  She works in Nurse, learning disability.  Allergies:  Allergies  Allergen Reactions  . No Known Allergies     Current Medications: Current Outpatient Prescriptions  Medication Sig Dispense Refill  . aspirin 325 MG tablet Take 325 mg by mouth daily.    Marland Kitchen HYDROcodone-acetaminophen (NORCO/VICODIN) 5-325 MG per tablet Take 1 tablet by  mouth every 4 (four) hours as needed for moderate pain.    Marland Kitchen atenolol (TENORMIN) 25 MG tablet Take 1 tablet (25 mg total) by mouth daily. 14 tablet 0  . cephALEXin (KEFLEX) 500 MG capsule Take 1 capsule (500 mg total) by mouth 3 (three) times daily. 21 capsule 0  . dexamethasone (DECADRON) 4 MG tablet Take 2 tablets (8 mg total) by mouth 2 (two) times daily. Take two tablets twice daily x 3 days starting on the day before each dose of Taxotere chemotherapy 36 tablet 0  . lidocaine-prilocaine (EMLA) cream Apply to affected area once (Patient not taking: Reported on 03/12/2015) 30 g 3  . lisinopril (PRINIVIL,ZESTRIL) 10 MG tablet Take 1 tablet (10 mg total) by mouth daily. 14 tablet 0  . simvastatin (ZOCOR) 40 MG tablet Take 1 tablet (40 mg total) by mouth daily at 6 PM. 14 tablet 0  . sulfamethoxazole-trimethoprim (BACTRIM DS) 800-160 MG per tablet Take 1 tablet by mouth 2 (two) times daily. (Patient not taking: Reported on 03/12/2015) 20 tablet 0   No current facility-administered medications for this visit.   Facility-Administered Medications Ordered in Other Visits  Medication Dose Route Frequency Provider Last Rate Last Dose  . famotidine (PEPCID) IVPB 20 mg  20 mg Intravenous Q12H [name], MD   20 mg at 01/29/15 1230    Review of Systems:  GENERAL:  Feels good.  Doing fine.  No  fevers, sweats or weight loss. PERFORMANCE STATUS (ECOG):  0 HEENT:  No visual changes, runny nose, sore throat, mouth sores or tenderness. Lungs: No shortness of breath or cough.  No hemoptysis. Cardiac:  No chest pain, palpitations, orthopnea, or PND. GI:  No nausea, vomiting, diarrhea, constipation, melena or hematochezia. GU:  No urgency, frequency, dysuria, or hematuria. Musculoskeletal:  No back pain.  No joint pain.  No muscle tenderness. Extremities:  No pain or swelling. Skin:  Finger nail changes.  No rashes or skin changes. Neuro:  Mild grade I neuropathy in toes.  No headache, weakness, balance  or coordination issues. Endocrine:  No diabetes, thyroid issues, hot flashes or night sweats. Psych:  No mood changes, depression or anxiety. Pain:  No focal pain. Review of systems:  All other systems reviewed and found to be negative.   Physical Exam: Blood pressure 152/83, pulse 92, temperature 96.7 F (35.9 C), temperature source Tympanic, height _0  (1.676 m), weight 162 lb 4.1 oz (73.6 kg).  GENERAL:  Well developed, well nourished, sitting comfortably in the exam room in no acute distress. MENTAL STATUS:  Alert and oriented to person, place and time. HEAD:  Short styled brown wig.  Normocephalic, atraumatic, face symmetric, no Cushingoid features. EYES:  Glasses.  Brown eyes.  Pupils equal round and reactive to light and accomodation.  No conjunctivitis or scleral icterus. ENT:  Oropharynx clear without lesion.  Tongue normal. Mucous membranes moist.  RESPIRATORY:  Clear to auscultation without rales, wheezes or rhonchi. CARDIOVASCULAR:  Regular rate and rhythm without murmur, rub or gallop. ABDOMEN:  Soft, non-tender, with active bowel sounds, and no hepatosplenomegaly.  No masses. SKIN:  No rashes, ulcers or lesions. EXTREMITIES: Nail changes (minor).  No edema, no skin discoloration or tenderness.  No palpable cords. LYMPH NODES: No palpable cervical, supraclavicular, axillary or inguinal adenopathy  NEUROLOGICAL: Bilateral patellar reflexes 2+.  Unremarkable. PSYCH:  Appropriate.   Appointment on 03/12/2015  Component Date Value Ref Range Status  . WBC 03/12/2015 11.3* 3.6 - 11.0 K/uL Final  . RBC 03/12/2015 3.50* 3.80 - 5.20 MIL/uL Final  . Hemoglobin 03/12/2015 10.8* 12.0 - 16.0 g/dL Final  . HCT 03/12/2015 32.9* 35.0 - 47.0 % Final  . MCV 03/12/2015 93.9  80.0 - 100.0 fL Final  . MCH 03/12/2015 30.8  26.0 - 34.0 pg Final  . MCHC 03/12/2015 32.8  32.0 - 36.0 g/dL Final  . RDW 03/12/2015 18.9* 11.5 - 14.5 % Final  . Platelets 03/12/2015 286  150 - 440 K/uL Final  .  Neutrophils Relative % 03/12/2015 94   Final  . Neutro Abs 03/12/2015 10.6* 1.4 - 6.5 K/uL Final  . Lymphocytes Relative 03/12/2015 4   Final  . Lymphs Abs 03/12/2015 0.5* 1.0 - 3.6 K/uL Final  . Monocytes Relative 03/12/2015 2   Final  . Monocytes Absolute 03/12/2015 0.2  0.2 - 0.9 K/uL Final  . Eosinophils Relative 03/12/2015 0   Final  . Eosinophils Absolute 03/12/2015 0.0  0 - 0.7 K/uL Final  . Basophils Relative 03/12/2015 0   Final  . Basophils Absolute 03/12/2015 0.0  0 - 0.1 K/uL Final  . Sodium 03/12/2015 138  135 - 145 mmol/L Final  . Potassium 03/12/2015 4.0  3.5 - 5.1 mmol/L Final  . Chloride 03/12/2015 105  101 - 111 mmol/L Final  . CO2 03/12/2015 26  22 - 32 mmol/L Final  . Glucose, Bld 03/12/2015 130* 65 - 99 mg/dL Final  . BUN 03/12/2015 18  6 - 20 mg/dL Final  . Creatinine, Ser 03/12/2015 1.00  0.44 - 1.00 mg/dL Final  . Calcium 03/12/2015 8.7* 8.9 - 10.3 mg/dL Final  . Total Protein 03/12/2015 6.3* 6.5 - 8.1 g/dL Final  . Albumin 03/12/2015 3.9  3.5 - 5.0 g/dL Final  . AST 03/12/2015 29  15 - 41 U/L Final  . ALT 03/12/2015 27  14 - 54 U/L Final  . Alkaline Phosphatase 03/12/2015 61  38 - 126 U/L Final  . Total Bilirubin 03/12/2015 0.6  0.3 - 1.2 mg/dL Final  . GFR calc non Af Amer 03/12/2015 60* >60 mL/min Final  . GFR calc Af Amer 03/12/2015 >60  >60 mL/min Final   Comment: (NOTE) The eGFR has been calculated using the CKD EPI equation. This calculation has not been validated in all clinical situations. eGFR's persistently <60 mL/min signify possible Chronic Kidney Disease.   . Anion gap 03/12/2015 7  5 - 15 Final    Assessment:  Loa Idler is a 59 y.o. female with clinical stage IIB (T2N1M0) left breast cancer.  Initial tumor size was 4.3 x 4 x 3.4 cm.  There was an axillary lymph node measuring 2.7 cm. Pathology revealed poorly differentiated carcinoma consistent with breast primary. ER was greater than 90%, PR 51-90%, and HER-2/neu 3+.  PET scan on  12/31/2014 revealed a hypermetabolic left breast mass consistent with breast primary. There was evidence of nodal metastases in the left axilla. There were 2 lesions in the liver with mild metabolic activity (focal nodular hyperplasia versus metastatic disease). CT-guided biopsy of the liver on 01/04/2015 was negative for malignancy.  She is status post 3 cycles of neoadjuvant Taxotere, carboplatin, Herceptin, and Perjeta (01/08/2015 - 02/19/2015). She is tolerating her chemotherapy well. She has been dehydrated on 2 separate occasions requiring IV fluids.  Symptomatically she is doing well. She has a grade I neuropathy in her toes.  Plan: 1. Labs today:  CBC with diff, CMP. 2. Cycle #4 [place] + Perjeta 3. RTC in 2 days in [place] for Neulasta. 4. Refill Decadron. 5. RTC in 3 weeks for MD assessment, labs (CBC with diff, CMP), and cycle #5 Taxotere, carboplatin, and Herceptin.   Lequita Asal, MD  03/12/2015, 4:36 PM

## 2015-03-13 ENCOUNTER — Other Ambulatory Visit: Payer: Self-pay | Admitting: Hematology and Oncology

## 2015-03-13 ENCOUNTER — Inpatient Hospital Stay (HOSPITAL_BASED_OUTPATIENT_CLINIC_OR_DEPARTMENT_OTHER): Payer: BLUE CROSS/BLUE SHIELD | Admitting: Hematology and Oncology

## 2015-03-13 ENCOUNTER — Other Ambulatory Visit: Payer: Self-pay

## 2015-03-13 ENCOUNTER — Ambulatory Visit
Admission: RE | Admit: 2015-03-13 | Discharge: 2015-03-13 | Disposition: A | Discharge status: Home / Self Care | Payer: BLUE CROSS/BLUE SHIELD | Source: Ambulatory Visit | Attending: Hematology and Oncology | Admitting: Hematology and Oncology

## 2015-03-13 VITALS — BP 137/77 | HR 79 | Temp 97.7°F

## 2015-03-13 DIAGNOSIS — M7989 Other specified soft tissue disorders: Secondary | ICD-10-CM

## 2015-03-13 DIAGNOSIS — C50412 Malignant neoplasm of upper-outer quadrant of left female breast: Secondary | ICD-10-CM

## 2015-03-13 DIAGNOSIS — Z803 Family history of malignant neoplasm of breast: Secondary | ICD-10-CM

## 2015-03-13 DIAGNOSIS — C773 Secondary and unspecified malignant neoplasm of axilla and upper limb lymph nodes: Secondary | ICD-10-CM | POA: Diagnosis not present

## 2015-03-13 DIAGNOSIS — R600 Localized edema: Secondary | ICD-10-CM

## 2015-03-13 DIAGNOSIS — I252 Old myocardial infarction: Secondary | ICD-10-CM

## 2015-03-13 DIAGNOSIS — I10 Essential (primary) hypertension: Secondary | ICD-10-CM | POA: Diagnosis not present

## 2015-03-13 DIAGNOSIS — E785 Hyperlipidemia, unspecified: Secondary | ICD-10-CM

## 2015-03-13 DIAGNOSIS — Z79899 Other long term (current) drug therapy: Secondary | ICD-10-CM

## 2015-03-13 DIAGNOSIS — C50912 Malignant neoplasm of unspecified site of left female breast: Secondary | ICD-10-CM

## 2015-03-13 DIAGNOSIS — Z7982 Long term (current) use of aspirin: Secondary | ICD-10-CM

## 2015-03-13 DIAGNOSIS — F17210 Nicotine dependence, cigarettes, uncomplicated: Secondary | ICD-10-CM

## 2015-03-13 NOTE — Progress Notes (Signed)
[facility] day:  03/13/2015  Chief Complaint: Sarah Horne is a 59 y.o. female with clinical stage IIB left breast cancer was seen for sick call visit on day 2 of cycle #4 neoadjuvant Taxotere, carboplatin, Herceptin and Perjeta.   HPI: The patient was last seen in the medical oncology clinic on 03/12/2015.  At that time, she was doing well. He received cycle #4 Taxotere, carboplatin, Herceptin and Perjeta in clinic uneventfully.  She describes left upper extremity swelling. She denies any trauma or pain. She denies any fever or redness.  She is concerned that this may be reaction to chemotherapy.  Past Medical History  Diagnosis Date  . Hypertension   . Hyperlipidemia   . Heart attack 1998    Past Surgical History  Procedure Laterality Date  . Breast surgery Left 12/20/14    breast biopsy    Family History  Problem Relation Age of Onset  . Breast cancer Maternal Aunt   . Breast cancer Cousin   . Brain cancer Maternal Uncle     Social History:  reports that she has been smoking Cigarettes.  She has a 18 pack-year smoking history. She does not have any smokeless tobacco history on file. She reports that she does not drink alcohol or use illicit drugs.  The patient is alone today  Allergies:  Allergies  Allergen Reactions  . No Known Allergies     Current Medications: Current Outpatient Prescriptions  Medication Sig Dispense Refill  . aspirin 325 MG tablet Take 325 mg by mouth daily.    Marland Kitchen atenolol (TENORMIN) 25 MG tablet Take 1 tablet (25 mg total) by mouth daily. 14 tablet 0  . dexamethasone (DECADRON) 4 MG tablet Take 2 tablets (8 mg total) by mouth 2 (two) times daily. Take two tablets twice daily x 3 days starting on the day before each dose of Taxotere chemotherapy 36 tablet 0  . HYDROcodone-acetaminophen (NORCO/VICODIN) 5-325 MG per tablet Take 1 tablet by mouth every 4 (four) hours as needed for moderate pain.    Marland Kitchen  lidocaine-prilocaine (EMLA) cream Apply to affected area once (Patient not taking: Reported on 03/12/2015) 30 g 3  . lisinopril (PRINIVIL,ZESTRIL) 10 MG tablet Take 1 tablet (10 mg total) by mouth daily. 14 tablet 0  . simvastatin (ZOCOR) 40 MG tablet Take 1 tablet (40 mg total) by mouth daily at 6 PM. 14 tablet 0  . sulfamethoxazole-trimethoprim (BACTRIM DS) 800-160 MG per tablet Take 1 tablet by mouth 2 (two) times daily. (Patient not taking: Reported on 03/12/2015) 20 tablet 0   No current facility-administered medications for this visit.   Facility-Administered Medications Ordered in Other Visits  Medication Dose Route Frequency Provider Last Rate Last Dose  . famotidine (PEPCID) IVPB 20 mg  20 mg Intravenous Q12H [name], MD   20 mg at 01/29/15 1230    Review of Systems:  GENERAL: Feels good. No fevers, sweats or weight loss. PERFORMANCE STATUS (ECOG): 0 HEENT: No visual changes, runny nose, sore throat, mouth sores or tenderness. Lungs: No shortness of breath or cough. No hemoptysis. Cardiac: No chest pain, palpitations, orthopnea, or PND. GI: No nausea, vomiting, diarrhea, constipation, melena or hematochezia. GU: No urgency, frequency, dysuria, or hematuria. Musculoskeletal: No back pain. No joint pain. No muscle tenderness. Extremities: New left upper extremity swelling. No pain or erythema. Skin: Finger nail changes. Notes several attempts at blood draws in both arms yesterday.  No rashes or skin changes. Neuro:  Mild grade I neuropathy in toes. No headache, weakness, balance or coordination issues. Endocrine: No diabetes, thyroid issues, hot flashes or night sweats. Psych: No mood changes, depression or anxiety. Pain: No focal pain. Review of systems: All other systems reviewed and found to be negative  Physical Exam: Blood pressure 137/77, pulse 79, temperature 97.7 F (36.5 C), temperature source Tympanic. GENERAL: Well developed, well nourished,  sitting comfortably in the exam room in no acute distress. MENTAL STATUS: Alert and oriented to person, place and time. HEAD: Short styled brown wig. Normocephalic, atraumatic, face symmetric, no Cushingoid features.  No facial plethora. EYES: Glasses. Brown eyes. No conjunctivitis or scleral icterus. SKIN: No dilated veins across upper chest.  Few small bilateral upper extremity bruises.  No rashes, ulcers or lesions. EXTREMITIES: Left upper extremity edema. No increased warmth or tenderness. LYMPH NODES: No palpable cervical, supraclavicular, or axillary adenopathy  PSYCH: Appropriate.   Appointment on 03/12/2015  Component Date Value Ref Range Status  . WBC 03/12/2015 11.3* 3.6 - 11.0 K/uL Final  . RBC 03/12/2015 3.50* 3.80 - 5.20 MIL/uL Final  . Hemoglobin 03/12/2015 10.8* 12.0 - 16.0 g/dL Final  . HCT 03/12/2015 32.9* 35.0 - 47.0 % Final  . MCV 03/12/2015 93.9  80.0 - 100.0 fL Final  . MCH 03/12/2015 30.8  26.0 - 34.0 pg Final  . MCHC 03/12/2015 32.8  32.0 - 36.0 g/dL Final  . RDW 03/12/2015 18.9* 11.5 - 14.5 % Final  . Platelets 03/12/2015 286  150 - 440 K/uL Final  . Neutrophils Relative % 03/12/2015 94   Final  . Neutro Abs 03/12/2015 10.6* 1.4 - 6.5 K/uL Final  . Lymphocytes Relative 03/12/2015 4   Final  . Lymphs Abs 03/12/2015 0.5* 1.0 - 3.6 K/uL Final  . Monocytes Relative 03/12/2015 2   Final  . Monocytes Absolute 03/12/2015 0.2  0.2 - 0.9 K/uL Final  . Eosinophils Relative 03/12/2015 0   Final  . Eosinophils Absolute 03/12/2015 0.0  0 - 0.7 K/uL Final  . Basophils Relative 03/12/2015 0   Final  . Basophils Absolute 03/12/2015 0.0  0 - 0.1 K/uL Final  . Sodium 03/12/2015 138  135 - 145 mmol/L Final  . Potassium 03/12/2015 4.0  3.5 - 5.1 mmol/L Final  . Chloride 03/12/2015 105  101 - 111 mmol/L Final  . CO2 03/12/2015 26  22 - 32 mmol/L Final  . Glucose, Bld 03/12/2015 130* 65 - 99 mg/dL Final  . BUN 03/12/2015 18  6 - 20 mg/dL Final  . Creatinine, Ser  03/12/2015 1.00  0.44 - 1.00 mg/dL Final  . Calcium 03/12/2015 8.7* 8.9 - 10.3 mg/dL Final  . Total Protein 03/12/2015 6.3* 6.5 - 8.1 g/dL Final  . Albumin 03/12/2015 3.9  3.5 - 5.0 g/dL Final  . AST 03/12/2015 29  15 - 41 U/L Final  . ALT 03/12/2015 27  14 - 54 U/L Final  . Alkaline Phosphatase 03/12/2015 61  38 - 126 U/L Final  . Total Bilirubin 03/12/2015 0.6  0.3 - 1.2 mg/dL Final  . GFR calc non Af Amer 03/12/2015 60* >60 mL/min Final  . GFR calc Af Amer 03/12/2015 >60  >60 mL/min Final   Comment: (NOTE) The eGFR has been calculated using the CKD EPI equation. This calculation has not been validated in all clinical situations. eGFR's persistently <60 mL/min signify possible Chronic Kidney Disease.   . Anion gap 03/12/2015 7  5 - 15 Final    Assessment:  Sarah Horne is  a 59 y.o. female with clinical stage IIB (T2N1M0) left breast cancer. Initial tumor size was 4.3 x 4 x 3.4 cm. There was an axillary lymph node measuring 2.7 cm. Pathology revealed poorly differentiated carcinoma consistent with breast primary. ER was greater than 90%, PR 51-90%, and HER-2/neu 3+.  PET scan on 12/31/2014 revealed a hypermetabolic left breast mass consistent with breast primary. There was evidence of nodal metastases in the left axilla. There were 2 lesions in the liver with mild metabolic activity (focal nodular hyperplasia versus metastatic disease). CT-guided biopsy of the liver on 01/04/2015 was negative for malignancy.  She is currently day 2 status post cycle #4 neoadjuvant Taxotere, carboplatin, Herceptin, and Perjeta (01/08/2015 - 03/12/2015). She is tolerating her chemotherapy well. She has been dehydrated on 2 separate occasions requiring IV fluids.  She has a grade I neuropathy in her toes.  Symptomatically, she has new onset left upper extremity edema.  Her port-a-cath is on the right side.  She underwent blood draws in the left arm.  She has no fever.  Exam reveals no erythema or  tenderness.  Plan: 1. Left upper extremity duplex- r/o DVT. 2. Symptomatic management. 3. If swelling increases or does not resolve, consider chest CT with contrast. 4. Watch for any evidence of infection. 5. RTC as previously scheduled unless concerns persist.  Lequita Asal, MD  03/13/2015, 4:53 PM

## 2015-03-13 NOTE — Progress Notes (Signed)
Patient called this afternoon to report new onset on left arm swelling; no pain reported. Patient is concerned that this is a chemo reaction.

## 2015-03-14 ENCOUNTER — Ambulatory Visit: Payer: BLUE CROSS/BLUE SHIELD

## 2015-03-14 ENCOUNTER — Inpatient Hospital Stay: Payer: BLUE CROSS/BLUE SHIELD

## 2015-03-14 VITALS — BP 146/75 | HR 72 | Temp 98.1°F | Resp 18

## 2015-03-14 DIAGNOSIS — C50912 Malignant neoplasm of unspecified site of left female breast: Secondary | ICD-10-CM

## 2015-03-14 MED ORDER — PEGFILGRASTIM INJECTION 6 MG/0.6ML ~~LOC~~
6.0000 mg | PREFILLED_SYRINGE | Freq: Once | SUBCUTANEOUS | Status: AC
  Administered 2015-03-14: 6 mg via SUBCUTANEOUS

## 2015-03-14 NOTE — Progress Notes (Unsigned)
Pt states that her edema got better but still there. You can see visible difference on left arm at her hand , wrist and elbow in comparison to right arm.  I advised pt after speaking to dr Mike Gip that because she has breast cancer on left side that has axillary node involvement to not get b/p, venipunctures, and injections on that side.  Dr. Mike Gip said that if pt felt edema was worse when she comes in tom. That she should let staff know and she will order either another venous doppler or proceed with venogram or ct to check that arm. Pt aware of all of this and will let staff know in [place] know that works in [place].

## 2015-03-15 ENCOUNTER — Inpatient Hospital Stay: Payer: BLUE CROSS/BLUE SHIELD

## 2015-03-15 VITALS — BP 112/62 | HR 68 | Temp 96.7°F | Resp 18

## 2015-03-15 DIAGNOSIS — C50912 Malignant neoplasm of unspecified site of left female breast: Secondary | ICD-10-CM | POA: Diagnosis not present

## 2015-03-15 DIAGNOSIS — E860 Dehydration: Secondary | ICD-10-CM

## 2015-03-15 MED ORDER — SODIUM CHLORIDE 0.9 % IV SOLN
Freq: Once | INTRAVENOUS | Status: AC
  Administered 2015-03-15: 12:00:00 via INTRAVENOUS
  Filled 2015-03-15: qty 1000

## 2015-03-15 MED ORDER — SODIUM CHLORIDE 0.9 % IJ SOLN
10.0000 mL | INTRAMUSCULAR | Status: DC | PRN
  Administered 2015-03-15: 10 mL via INTRAVENOUS
  Filled 2015-03-15: qty 10

## 2015-03-15 MED ORDER — HEPARIN SOD (PORK) LOCK FLUSH 100 UNIT/ML IV SOLN
INTRAVENOUS | Status: AC
  Filled 2015-03-15: qty 5

## 2015-03-15 MED ORDER — HEPARIN SOD (PORK) LOCK FLUSH 100 UNIT/ML IV SOLN
500.0000 [IU] | Freq: Once | INTRAVENOUS | Status: AC
  Administered 2015-03-15: 500 [IU] via INTRAVENOUS

## 2015-03-15 NOTE — Progress Notes (Signed)
Pt here for IV hydration today. L arm remains swollen. She has numbness and tingling in her fingers from her chemotherapy. Denies any pain in that arm. She tried to keep her arm eleavated last night. Notified Dr Mike Gip of her arm remaining swollen. Instructed pt to continue to elevate that arm. Pt is to call MD if pain or numbness occurs in her L arm. Pt's breast cancer is on her L side. Pt reports she has been getting all of her neulasta shots in that L arm. Pt will now request all BP and shots to happen on her R arm.

## 2015-03-16 ENCOUNTER — Emergency Department
Admission: EM | Admit: 2015-03-16 | Discharge: 2015-03-17 | Disposition: A | Discharge status: Home / Self Care | Payer: BLUE CROSS/BLUE SHIELD | Attending: Emergency Medicine | Admitting: Emergency Medicine

## 2015-03-16 ENCOUNTER — Encounter: Payer: Self-pay | Admitting: Emergency Medicine

## 2015-03-16 DIAGNOSIS — Z79899 Other long term (current) drug therapy: Secondary | ICD-10-CM | POA: Diagnosis not present

## 2015-03-16 DIAGNOSIS — R531 Weakness: Secondary | ICD-10-CM

## 2015-03-16 DIAGNOSIS — I10 Essential (primary) hypertension: Secondary | ICD-10-CM | POA: Diagnosis not present

## 2015-03-16 DIAGNOSIS — Z720 Tobacco use: Secondary | ICD-10-CM | POA: Diagnosis not present

## 2015-03-16 DIAGNOSIS — N390 Urinary tract infection, site not specified: Secondary | ICD-10-CM | POA: Insufficient documentation

## 2015-03-16 DIAGNOSIS — Z7982 Long term (current) use of aspirin: Secondary | ICD-10-CM | POA: Diagnosis not present

## 2015-03-16 LAB — URINALYSIS COMPLETE WITH MICROSCOPIC (ARMC ONLY)
BILIRUBIN URINE: NEGATIVE
Bacteria, UA: NONE SEEN
GLUCOSE, UA: NEGATIVE mg/dL
Hgb urine dipstick: NEGATIVE
Ketones, ur: NEGATIVE mg/dL
Nitrite: NEGATIVE
Protein, ur: NEGATIVE mg/dL
Specific Gravity, Urine: 1.019 (ref 1.005–1.030)
pH: 7 (ref 5.0–8.0)

## 2015-03-16 LAB — CBC WITH DIFFERENTIAL/PLATELET
Basophils Absolute: 0.2 10*3/uL — ABNORMAL HIGH (ref 0–0.1)
Basophils Relative: 1 %
EOS PCT: 0 %
Eosinophils Absolute: 0 10*3/uL (ref 0–0.7)
HEMATOCRIT: 35.8 % (ref 35.0–47.0)
HEMOGLOBIN: 11.8 g/dL — AB (ref 12.0–16.0)
Lymphocytes Relative: 8 %
Lymphs Abs: 1.4 10*3/uL (ref 1.0–3.6)
MCH: 31.2 pg (ref 26.0–34.0)
MCHC: 32.8 g/dL (ref 32.0–36.0)
MCV: 95.2 fL (ref 80.0–100.0)
MONO ABS: 0.1 10*3/uL — AB (ref 0.2–0.9)
MONOS PCT: 0 %
Neutro Abs: 17 10*3/uL — ABNORMAL HIGH (ref 1.4–6.5)
Neutrophils Relative %: 91 %
Platelets: 120 10*3/uL — ABNORMAL LOW (ref 150–440)
RBC: 3.77 MIL/uL — ABNORMAL LOW (ref 3.80–5.20)
RDW: 18.7 % — ABNORMAL HIGH (ref 11.5–14.5)
WBC: 18.7 10*3/uL — ABNORMAL HIGH (ref 3.6–11.0)

## 2015-03-16 LAB — COMPREHENSIVE METABOLIC PANEL
ALT: 46 U/L (ref 14–54)
AST: 27 U/L (ref 15–41)
Albumin: 3.9 g/dL (ref 3.5–5.0)
Alkaline Phosphatase: 72 U/L (ref 38–126)
Anion gap: 5 (ref 5–15)
BUN: 18 mg/dL (ref 6–20)
CALCIUM: 8.4 mg/dL — AB (ref 8.9–10.3)
CO2: 29 mmol/L (ref 22–32)
Chloride: 100 mmol/L — ABNORMAL LOW (ref 101–111)
Creatinine, Ser: 0.63 mg/dL (ref 0.44–1.00)
GFR calc Af Amer: >60 mL/min (ref >60)
GFR calc non Af Amer: >60 mL/min (ref >60)
GLUCOSE: 100 mg/dL — AB (ref 65–99)
Potassium: 3.6 mmol/L (ref 3.5–5.1)
Sodium: 134 mmol/L — ABNORMAL LOW (ref 135–145)
Total Bilirubin: 1 mg/dL (ref 0.3–1.2)
Total Protein: 6.4 g/dL — ABNORMAL LOW (ref 6.5–8.1)

## 2015-03-16 MED ORDER — CEPHALEXIN 500 MG PO CAPS
500.0000 mg | ORAL_CAPSULE | Freq: Once | ORAL | Status: AC
  Administered 2015-03-17: 500 mg via ORAL

## 2015-03-16 NOTE — ED Notes (Signed)
States usually gets an injection 2 days after her chemo, states she gets weak if she does not get IVF, states feels weak and did not get IVF today

## 2015-03-17 MED ORDER — CEPHALEXIN 500 MG PO CAPS
ORAL_CAPSULE | ORAL | Status: AC
  Administered 2015-03-17: 500 mg via ORAL
  Filled 2015-03-17: qty 1

## 2015-03-17 MED ORDER — CEPHALEXIN 500 MG PO CAPS: 500.0000 mg | ORAL_CAPSULE | Freq: Three times a day (TID) | ORAL | Status: AC

## 2015-03-17 MED ORDER — SODIUM CHLORIDE 0.9 % IV BOLUS (SEPSIS)
1000.0000 mL | Freq: Once | INTRAVENOUS | Status: AC
  Administered 2015-03-17: 1000 mL via INTRAVENOUS

## 2015-03-17 NOTE — ED Provider Notes (Addendum)
[facility] Emergency Department Provider Note  ____________________________________________  Time seen: Approximately 3500 AM  I have reviewed the triage vital signs and the nursing notes.   HISTORY  Chief Complaint Weakness    HPI Sarah Horne is a 59 y.o. female with a history of stage III breast cancer with chemotherapy this past Thursday present with diffuse weakness which is mild to moderate.. She says that she often feels weak after chemotherapy and needs fluids. She was given fluids after chemotherapy but thinks she may not of had enough. She says she has been eating and drinking normally at home. Denies any other complaints such as fever, cough, dysuria abdominal pain nausea or vomiting. Has had similar reactions before after chemotherapy.   Past Medical History  Diagnosis Date  . Hypertension   . Hyperlipidemia   . Heart attack 1998  . Cancer     Patient Active Problem List   Diagnosis Date Noted  . Breast cancer of upper-outer quadrant of left female breast 12/27/2014    Past Surgical History  Procedure Laterality Date  . Breast surgery Left 12/20/14    breast biopsy    Current Outpatient Rx  Name  Route  Sig  Dispense  Refill  . aspirin 325 MG tablet   Oral   Take 325 mg by mouth daily.         Marland Kitchen atenolol (TENORMIN) 25 MG tablet   Oral   Take 1 tablet (25 mg total) by mouth daily.   14 tablet   0   . dexamethasone (DECADRON) 4 MG tablet   Oral   Take 2 tablets (8 mg total) by mouth 2 (two) times daily. Take two tablets twice daily x 3 days starting on the day before each dose of Taxotere chemotherapy   36 tablet   0   . HYDROcodone-acetaminophen (NORCO/VICODIN) 5-325 MG per tablet   Oral   Take 1 tablet by mouth every 4 (four) hours as needed for moderate pain.         Marland Kitchen lisinopril (PRINIVIL,ZESTRIL) 10 MG tablet   Oral   Take 1 tablet (10 mg total) by mouth daily.   14 tablet   0   . simvastatin (ZOCOR) 40  MG tablet   Oral   Take 1 tablet (40 mg total) by mouth daily at 6 PM.   14 tablet   0   . lidocaine-prilocaine (EMLA) cream      Apply to affected area once Patient not taking: Reported on 03/12/2015   30 g   3   . sulfamethoxazole-trimethoprim (BACTRIM DS) 800-160 MG per tablet   Oral   Take 1 tablet by mouth 2 (two) times daily. Patient not taking: Reported on 03/12/2015   20 tablet   0     Allergies No known allergies  Family History  Problem Relation Age of Onset  . Breast cancer Maternal Aunt   . Breast cancer Cousin   . Brain cancer Maternal Uncle     Social History History  Substance Use Topics  . Smoking status: Current Every Day Smoker -- 0.50 packs/day for 18 years    Types: Cigarettes  . Smokeless tobacco: Not on file  . Alcohol Use: No    Review of Systems Constitutional: No fever/chills Eyes: No visual changes. ENT: No sore throat. Cardiovascular: Denies chest pain. Respiratory: Denies shortness of breath. Gastrointestinal: No abdominal pain.  No nausea, no vomiting.  No diarrhea.  No constipation. Genitourinary: Negative for dysuria.  Musculoskeletal: Negative for back pain. Skin: Negative for rash. Neurological: Negative for headaches, focal weakness or numbness.  10-point ROS otherwise negative.  ____________________________________________   PHYSICAL EXAM:  VITAL SIGNS: ED Triage Vitals  Enc Vitals Group     BP 03/16/15 1757 119/80 mmHg     Pulse Rate 03/16/15 1757 80     Resp 03/16/15 1757 18     Temp 03/16/15 1757 98.4 F (36.9 C)     Temp Source 03/16/15 1757 Oral     SpO2 03/16/15 1757 100 %     Weight 03/16/15 1757 158 lb (71.668 kg)     Height 03/16/15 1757 '5\' 6"'$  (1.676 m)     Head Cir --      Peak Flow --      Pain Score 03/16/15 1801 0     Pain Loc --      Pain Edu? --      Excl. in [place]? --     Constitutional: Alert and oriented. Well appearing and in no acute distress. Eyes: Conjunctivae are normal. PERRL.  EOMI. Head: Atraumatic. Nose: No congestion/rhinnorhea. Mouth/Throat: Mucous membranes are moist.  Oropharynx non-erythematous. Neck: No stridor.   Cardiovascular: Normal rate, regular rhythm. Grossly normal heart sounds.  Good peripheral circulation. Respiratory: Normal respiratory effort.  No retractions. Lungs CTAB. Gastrointestinal: Soft and nontender. No distention. No abdominal bruits. No CVA tenderness. Musculoskeletal: No lower extremity tenderness nor edema.  No joint effusions. Neurologic:  Normal speech and language. No gross focal neurologic deficits are appreciated. Speech is normal. No gait instability. Skin:  Skin is warm, dry and intact. No rash noted. Psychiatric: Mood and affect are normal. Speech and behavior are normal.  ____________________________________________   LABS (all labs ordered are listed, but only abnormal results are displayed)  Labs Reviewed  CBC WITH DIFFERENTIAL/PLATELET - Abnormal; Notable for the following:    WBC 18.7 (*)    RBC 3.77 (*)    Hemoglobin 11.8 (*)    RDW 18.7 (*)    Platelets 120 (*)    Neutro Abs 17.0 (*)    Monocytes Absolute 0.1 (*)    Basophils Absolute 0.2 (*)    All other components within normal limits  COMPREHENSIVE METABOLIC PANEL - Abnormal; Notable for the following:    Sodium 134 (*)    Chloride 100 (*)    Glucose, Bld 100 (*)    Calcium 8.4 (*)    Total Protein 6.4 (*)    All other components within normal limits  URINALYSIS COMPLETEWITH MICROSCOPIC ([facility] ONLY) - Abnormal; Notable for the following:    Color, Urine YELLOW (*)    APPearance CLEAR (*)    Leukocytes, UA 1+ (*)    Squamous Epithelial / LPF 0-5 (*)    All other components within normal limits  URINE CULTURE    ____________________________________________  EKG   ____________________________________________  RADIOLOGY   ____________________________________________   PROCEDURES    ____________________________________________   INITIAL IMPRESSION / ASSESSMENT AND PLAN / ED COURSE  Pertinent labs & imaging results that were available during my care of the patient were reviewed by me and considered in my medical decision making (see chart for details).  Discussed case with nurse practitioner on call for [facility] Magda Paganini, who says that day 3 or 4 after chemotherapy is common to have weakness. Discussed also elevated white blood cell count which has been seen before in the past. Patient is not on Neutrogen.  We'll treat patient for UTI which she has  been treated for the past.  ----------------------------------------- 3:22 AM on 03/17/2015 -----------------------------------------  Patient feeling improved with fluids. We'll DC to home with Keflex. ____________________________________________   FINAL CLINICAL IMPRESSION(S) / ED DIAGNOSES  Acute UTI. Acute weakness. return visit.    Orbie Pyo, MD 03/17/15 [phone]  Continued MDM. Patient seems to be having typical weakness after chemotherapy. Also NP from [facility] says typical on day 3 or 4 after chemotherapy to be having any symptoms.  Orbie Pyo, MD 03/17/15 [phone]

## 2015-03-17 NOTE — ED Notes (Signed)
Patient with no complaints at this time. Respirations even and unlabored. Skin warm/dry. Discharge instructions reviewed with patient at this time. Patient given opportunity to voice concerns/ask questions. IV removed per policy and band-aid applied to site. Patient discharged at this time and left Emergency Department with steady gait.  

## 2015-03-18 ENCOUNTER — Encounter: Payer: Self-pay | Admitting: Hematology and Oncology

## 2015-03-20 LAB — URINE CULTURE: Culture: 30000

## 2015-03-21 ENCOUNTER — Telehealth: Payer: Self-pay | Admitting: Family Medicine

## 2015-03-21 ENCOUNTER — Telehealth: Payer: Self-pay | Admitting: *Deleted

## 2015-03-21 ENCOUNTER — Other Ambulatory Visit: Payer: Self-pay | Admitting: Internal Medicine

## 2015-03-21 DIAGNOSIS — C50412 Malignant neoplasm of upper-outer quadrant of left female breast: Secondary | ICD-10-CM

## 2015-03-21 MED ORDER — GABAPENTIN 100 MG PO CAPS: 200.0000 mg | ORAL_CAPSULE | Freq: Three times a day (TID) | ORAL | Status: DC

## 2015-03-21 NOTE — Telephone Encounter (Signed)
Patient called with complaints of bilateral hand and foot pain, with numbness and tingling.  Rates pain an 8 on a scale of 0/10.  Wants to know if she can get a prescription for something for pain.  Discussed with Dr. Ma Hillock.  States he will e-prescribe gabapentin 2 tabs three times a day, but would I discuss with the patient to start with 1 tab three times a day and if she tolerates that well for about 3 days, she can go to 2 tabs three times a day.  Explained regimen to patient.  She voices good understanding.  She is to call back if she has any other questions.

## 2015-03-21 NOTE — Telephone Encounter (Signed)
Please return call. Have a question concerning her chemo treatment. [phone]

## 2015-03-26 NOTE — Telephone Encounter (Signed)
Patient had a question about pain and burning sensation in her feet after chemotherapy. She is going to check with her oncologist Dr. Ma Hillock.

## 2015-03-26 NOTE — Telephone Encounter (Signed)
Here's one, I don't know anything about chemo.

## 2015-04-02 ENCOUNTER — Other Ambulatory Visit: Payer: Self-pay

## 2015-04-02 ENCOUNTER — Inpatient Hospital Stay (HOSPITAL_BASED_OUTPATIENT_CLINIC_OR_DEPARTMENT_OTHER): Payer: BLUE CROSS/BLUE SHIELD | Admitting: Internal Medicine

## 2015-04-02 ENCOUNTER — Inpatient Hospital Stay: Payer: BLUE CROSS/BLUE SHIELD | Attending: Hematology and Oncology

## 2015-04-02 ENCOUNTER — Inpatient Hospital Stay: Payer: BLUE CROSS/BLUE SHIELD

## 2015-04-02 VITALS — BP 146/83 | HR 70 | Temp 95.9°F | Resp 18 | Ht 66.0 in | Wt 157.6 lb

## 2015-04-02 DIAGNOSIS — R531 Weakness: Secondary | ICD-10-CM | POA: Diagnosis not present

## 2015-04-02 DIAGNOSIS — Z5111 Encounter for antineoplastic chemotherapy: Secondary | ICD-10-CM | POA: Insufficient documentation

## 2015-04-02 DIAGNOSIS — Z803 Family history of malignant neoplasm of breast: Secondary | ICD-10-CM | POA: Diagnosis not present

## 2015-04-02 DIAGNOSIS — C50412 Malignant neoplasm of upper-outer quadrant of left female breast: Secondary | ICD-10-CM | POA: Insufficient documentation

## 2015-04-02 DIAGNOSIS — K769 Liver disease, unspecified: Secondary | ICD-10-CM | POA: Insufficient documentation

## 2015-04-02 DIAGNOSIS — C773 Secondary and unspecified malignant neoplasm of axilla and upper limb lymph nodes: Secondary | ICD-10-CM

## 2015-04-02 DIAGNOSIS — C50912 Malignant neoplasm of unspecified site of left female breast: Secondary | ICD-10-CM

## 2015-04-02 DIAGNOSIS — I252 Old myocardial infarction: Secondary | ICD-10-CM | POA: Diagnosis not present

## 2015-04-02 DIAGNOSIS — G620 Drug-induced polyneuropathy: Secondary | ICD-10-CM | POA: Diagnosis not present

## 2015-04-02 DIAGNOSIS — Z170 Estrogen receptor positive status [ER+]: Secondary | ICD-10-CM | POA: Diagnosis not present

## 2015-04-02 DIAGNOSIS — Z7982 Long term (current) use of aspirin: Secondary | ICD-10-CM

## 2015-04-02 DIAGNOSIS — E785 Hyperlipidemia, unspecified: Secondary | ICD-10-CM

## 2015-04-02 DIAGNOSIS — T451X5S Adverse effect of antineoplastic and immunosuppressive drugs, sequela: Secondary | ICD-10-CM | POA: Insufficient documentation

## 2015-04-02 DIAGNOSIS — F17210 Nicotine dependence, cigarettes, uncomplicated: Secondary | ICD-10-CM

## 2015-04-02 DIAGNOSIS — R5383 Other fatigue: Secondary | ICD-10-CM | POA: Insufficient documentation

## 2015-04-02 DIAGNOSIS — I10 Essential (primary) hypertension: Secondary | ICD-10-CM | POA: Diagnosis not present

## 2015-04-02 DIAGNOSIS — Z418 Encounter for other procedures for purposes other than remedying health state: Secondary | ICD-10-CM | POA: Insufficient documentation

## 2015-04-02 DIAGNOSIS — Z79899 Other long term (current) drug therapy: Secondary | ICD-10-CM | POA: Diagnosis not present

## 2015-04-02 LAB — CBC WITH DIFFERENTIAL/PLATELET
Basophils Absolute: 0 10*3/uL (ref 0–0.1)
Basophils Relative: 0 %
Eosinophils Absolute: 0 10*3/uL (ref 0–0.7)
Eosinophils Relative: 0 %
HEMATOCRIT: 36.2 % (ref 35.0–47.0)
Hemoglobin: 12.1 g/dL (ref 12.0–16.0)
LYMPHS ABS: 0.4 10*3/uL — AB (ref 1.0–3.6)
LYMPHS PCT: 5 %
MCH: 32.3 pg (ref 26.0–34.0)
MCHC: 33.3 g/dL (ref 32.0–36.0)
MCV: 96.8 fL (ref 80.0–100.0)
Monocytes Absolute: 0 10*3/uL — ABNORMAL LOW (ref 0.2–0.9)
Monocytes Relative: 1 %
Neutro Abs: 7.7 10*3/uL — ABNORMAL HIGH (ref 1.4–6.5)
Neutrophils Relative %: 94 %
Platelets: 370 10*3/uL (ref 150–440)
RBC: 3.74 MIL/uL — ABNORMAL LOW (ref 3.80–5.20)
RDW: 20.2 % — ABNORMAL HIGH (ref 11.5–14.5)
WBC: 8.1 10*3/uL (ref 3.6–11.0)

## 2015-04-02 LAB — COMPREHENSIVE METABOLIC PANEL
ALK PHOS: 57 U/L (ref 38–126)
ALT: 21 U/L (ref 14–54)
ANION GAP: 8 (ref 5–15)
AST: 27 U/L (ref 15–41)
Albumin: 4.1 g/dL (ref 3.5–5.0)
BILIRUBIN TOTAL: 0.6 mg/dL (ref 0.3–1.2)
BUN: 20 mg/dL (ref 6–20)
CO2: 25 mmol/L (ref 22–32)
Calcium: 8.8 mg/dL — ABNORMAL LOW (ref 8.9–10.3)
Chloride: 99 mmol/L — ABNORMAL LOW (ref 101–111)
Creatinine, Ser: 0.8 mg/dL (ref 0.44–1.00)
GLUCOSE: 129 mg/dL — AB (ref 65–99)
POTASSIUM: 4 mmol/L (ref 3.5–5.1)
Sodium: 132 mmol/L — ABNORMAL LOW (ref 135–145)
Total Protein: 7.2 g/dL (ref 6.5–8.1)

## 2015-04-02 MED ORDER — FAMOTIDINE IN NACL 20-0.9 MG/50ML-% IV SOLN
20.0000 mg | Freq: Two times a day (BID) | INTRAVENOUS | Status: DC
  Administered 2015-04-02: 20 mg via INTRAVENOUS
  Filled 2015-04-02: qty 50

## 2015-04-02 MED ORDER — SODIUM CHLORIDE 0.9 % IJ SOLN
10.0000 mL | INTRAMUSCULAR | Status: DC | PRN
  Administered 2015-04-02: 10 mL via INTRAVENOUS
  Filled 2015-04-02: qty 10

## 2015-04-02 MED ORDER — HEPARIN SOD (PORK) LOCK FLUSH 100 UNIT/ML IV SOLN
500.0000 [IU] | Freq: Once | INTRAVENOUS | Status: AC
  Administered 2015-04-02: 500 [IU] via INTRAVENOUS
  Filled 2015-04-02: qty 5

## 2015-04-02 MED ORDER — SODIUM CHLORIDE 0.9 % IV SOLN
420.0000 mg | Freq: Once | INTRAVENOUS | Status: AC
  Administered 2015-04-02: 420 mg via INTRAVENOUS
  Filled 2015-04-02: qty 14

## 2015-04-02 MED ORDER — DOCETAXEL CHEMO INJECTION 160 MG/16ML
60.0000 mg/m2 | Freq: Once | INTRAVENOUS | Status: AC
  Administered 2015-04-02: 110 mg via INTRAVENOUS
  Filled 2015-04-02: qty 11

## 2015-04-02 MED ORDER — ACETAMINOPHEN 325 MG PO TABS
650.0000 mg | ORAL_TABLET | Freq: Once | ORAL | Status: AC
  Administered 2015-04-02: 650 mg via ORAL
  Filled 2015-04-02: qty 2

## 2015-04-02 MED ORDER — SODIUM CHLORIDE 0.9 % IV SOLN
448.8000 mg | Freq: Once | INTRAVENOUS | Status: AC
  Administered 2015-04-02: 450 mg via INTRAVENOUS
  Filled 2015-04-02: qty 45

## 2015-04-02 MED ORDER — DIPHENHYDRAMINE HCL 50 MG/ML IJ SOLN
25.0000 mg | Freq: Once | INTRAMUSCULAR | Status: AC
  Administered 2015-04-02: 25 mg via INTRAVENOUS
  Filled 2015-04-02: qty 1

## 2015-04-02 MED ORDER — PALONOSETRON HCL INJECTION 0.25 MG/5ML
0.2500 mg | Freq: Once | INTRAVENOUS | Status: AC
  Administered 2015-04-02: 0.25 mg via INTRAVENOUS
  Filled 2015-04-02: qty 5

## 2015-04-02 MED ORDER — SODIUM CHLORIDE 0.9 % IV SOLN
Freq: Once | INTRAVENOUS | Status: AC
  Administered 2015-04-02: 12:00:00 via INTRAVENOUS
  Filled 2015-04-02: qty 5

## 2015-04-02 MED ORDER — SODIUM CHLORIDE 0.9 % IV SOLN
Freq: Once | INTRAVENOUS | Status: AC
  Administered 2015-04-02: 11:00:00 via INTRAVENOUS
  Filled 2015-04-02: qty 1000

## 2015-04-02 MED ORDER — TRASTUZUMAB CHEMO INJECTION 440 MG
440.0000 mg | Freq: Once | INTRAVENOUS | Status: AC
  Administered 2015-04-02: 440 mg via INTRAVENOUS
  Filled 2015-04-02: qty 20.95

## 2015-04-02 NOTE — Progress Notes (Signed)
Patient states that overall she has been doing pretty good. Her only complaint is that she has pain in her feet from neuropathy. She states that the medicine has helped some, but not a lot.

## 2015-04-03 ENCOUNTER — Telehealth: Payer: Self-pay | Admitting: Internal Medicine

## 2015-04-03 NOTE — Telephone Encounter (Signed)
Dr. Ma Hillock is ok with patient receiving one liter of fluids tomorrow in [facility] when she comes for her Neulasta injection. I have spoken to patient and she will come to [facility] tomorrow at 2:00pm for Neulasta and fluids. Patient will call Thursday morning to confirm this with Mickel Baas.

## 2015-04-03 NOTE — Telephone Encounter (Signed)
She said the past two times she's had chemo/Neulasta she ended up going to the emergency room the day after to get fluids. She doesn't want to have the expense again of the ER and requests that you please order fluids for her to have tomorrow with the Neulasta shot. We are waiting on Sarah Horne to find out whether Sarah Horne can get fluids tomorrow after Ma Hillock has gone. She would like to come in at 2pm for fluids and then get the Neulasta shot at 3. Please advise whether I should schedule this or not. Thanks.

## 2015-04-04 ENCOUNTER — Inpatient Hospital Stay: Payer: BLUE CROSS/BLUE SHIELD

## 2015-04-04 ENCOUNTER — Other Ambulatory Visit: Payer: Self-pay | Admitting: Internal Medicine

## 2015-04-04 ENCOUNTER — Ambulatory Visit: Payer: BLUE CROSS/BLUE SHIELD

## 2015-04-04 VITALS — BP 145/73 | HR 76 | Temp 98.0°F | Resp 18

## 2015-04-04 DIAGNOSIS — C50412 Malignant neoplasm of upper-outer quadrant of left female breast: Secondary | ICD-10-CM

## 2015-04-04 MED ORDER — PEGFILGRASTIM INJECTION 6 MG/0.6ML ~~LOC~~
6.0000 mg | PREFILLED_SYRINGE | Freq: Once | SUBCUTANEOUS | Status: AC
  Administered 2015-04-04: 6 mg via SUBCUTANEOUS
  Filled 2015-04-04: qty 0.6

## 2015-04-04 MED ORDER — SODIUM CHLORIDE 0.45 % IV SOLN
INTRAVENOUS | Status: DC
  Administered 2015-04-04: 15:00:00 via INTRAVENOUS
  Filled 2015-04-04: qty 1000

## 2015-04-04 MED ORDER — SODIUM CHLORIDE 0.9 % IJ SOLN
10.0000 mL | INTRAMUSCULAR | Status: AC | PRN
  Administered 2015-04-04: 10 mL via INTRAVENOUS
  Filled 2015-04-04: qty 10

## 2015-04-04 MED ORDER — HEPARIN SOD (PORK) LOCK FLUSH 100 UNIT/ML IV SOLN
500.0000 [IU] | Freq: Once | INTRAVENOUS | Status: AC
  Administered 2015-04-04: 500 [IU] via INTRAVENOUS
  Filled 2015-04-04: qty 5

## 2015-04-08 ENCOUNTER — Other Ambulatory Visit: Payer: Self-pay

## 2015-04-08 ENCOUNTER — Inpatient Hospital Stay: Payer: BLUE CROSS/BLUE SHIELD

## 2015-04-08 ENCOUNTER — Other Ambulatory Visit: Payer: Self-pay | Admitting: *Deleted

## 2015-04-08 VITALS — BP 132/70 | HR 94 | Temp 97.7°F | Resp 20

## 2015-04-08 DIAGNOSIS — E860 Dehydration: Secondary | ICD-10-CM

## 2015-04-08 DIAGNOSIS — C50412 Malignant neoplasm of upper-outer quadrant of left female breast: Secondary | ICD-10-CM

## 2015-04-08 LAB — CBC WITH DIFFERENTIAL/PLATELET
Basophils Absolute: 0 10*3/uL (ref 0–0.1)
Basophils Relative: 1 %
EOS ABS: 0.1 10*3/uL (ref 0–0.7)
Eosinophils Relative: 2 %
HCT: 37.8 % (ref 35.0–47.0)
Hemoglobin: 12.5 g/dL (ref 12.0–16.0)
Lymphs Abs: 1.2 10*3/uL (ref 1.0–3.6)
MCH: 32.1 pg (ref 26.0–34.0)
MCHC: 33.1 g/dL (ref 32.0–36.0)
MCV: 97 fL (ref 80.0–100.0)
MONO ABS: 0.2 10*3/uL (ref 0.2–0.9)
Neutro Abs: 1.8 10*3/uL (ref 1.4–6.5)
Neutrophils Relative %: 55 %
PLATELETS: 110 10*3/uL — AB (ref 150–440)
RBC: 3.9 MIL/uL (ref 3.80–5.20)
RDW: 18.6 % — AB (ref 11.5–14.5)
WBC: 3.3 10*3/uL — ABNORMAL LOW (ref 3.6–11.0)

## 2015-04-08 LAB — BASIC METABOLIC PANEL
Anion gap: 13 (ref 5–15)
BUN: 13 mg/dL (ref 6–20)
CO2: 29 mmol/L (ref 22–32)
Calcium: 9.3 mg/dL (ref 8.9–10.3)
Chloride: 89 mmol/L — ABNORMAL LOW (ref 101–111)
Creatinine, Ser: 0.71 mg/dL (ref 0.44–1.00)
GLUCOSE: 118 mg/dL — AB (ref 65–99)
POTASSIUM: 3.5 mmol/L (ref 3.5–5.1)
SODIUM: 131 mmol/L — AB (ref 135–145)

## 2015-04-08 MED ORDER — SODIUM CHLORIDE 0.9 % IJ SOLN
10.0000 mL | INTRAMUSCULAR | Status: DC | PRN
  Administered 2015-04-08: 10 mL via INTRAVENOUS
  Filled 2015-04-08: qty 10

## 2015-04-08 MED ORDER — HEPARIN SOD (PORK) LOCK FLUSH 100 UNIT/ML IV SOLN
INTRAVENOUS | Status: AC
  Filled 2015-04-08: qty 5

## 2015-04-08 MED ORDER — HEPARIN SOD (PORK) LOCK FLUSH 100 UNIT/ML IV SOLN
500.0000 [IU] | Freq: Once | INTRAVENOUS | Status: AC
  Administered 2015-04-08: 500 [IU] via INTRAVENOUS

## 2015-04-08 MED ORDER — SODIUM CHLORIDE 0.9 % IV SOLN
INTRAVENOUS | Status: AC
  Administered 2016-12-08 – 2016-12-10 (×2): via INTRAVENOUS
  Filled 2015-04-08: qty 1000

## 2015-04-08 MED ORDER — SODIUM CHLORIDE 0.9 % IV SOLN
INTRAVENOUS | Status: DC
  Administered 2015-04-08: 11:00:00 via INTRAVENOUS
  Filled 2015-04-08: qty 1000

## 2015-04-09 ENCOUNTER — Other Ambulatory Visit: Payer: BLUE CROSS/BLUE SHIELD

## 2015-04-11 NOTE — Progress Notes (Signed)
[place]  [phone]) [phone] [phone]     ID: Sarah Horne OB: 1956-07-17  MR#: [id number]  EXN#:[id number]  Patient Care Team: [name], MD as PCP - General (Family Medicine) [name], MD (General Surgery) [name], MD as Attending Physician (Internal Medicine)  CHIEF COMPLAINT/DIAGNOSIS:   Invasive ductal carcinoma of the left breast grade 2 on biopsy  (clinically T2N1Mx, stage II disease). Tumor size 4.3 x 4.0 x 3.4 cm. Left axillary lymph node measuring 2.7 cm. Left axillary lymph node biopsy positive for involvement by poorly differentiated carcinoma consistent with breast primary, left breast mass biopsy showed invasive ductal carcinoma grade 2, biopsy done on 12/18/2014. ER positive greater than 90%, PR positive 51% to 90%, HER2/neu positive (3+ on IHC). PET scan 12/31/14 - IMPRESSION: 1. Hypermetabolic left breast mass consists with primary carcinoma. 2. Evidence of local nodal metastasis to the left axilla. 3. No evidence of central nodal metastasis to mediastinum. 4. Two lesions in liver with mild metabolic activity. Differential would include focal nodular hyperplasia versus metastatic breast cancer. Recommend either a contrast-enhanced liver MRI or for added specificity ultrasound-guided hepatic biopsy. CT-guided liver biopsy 01/04/15 - negative for malignancy.  Patient started on Neoadjuvant chemotherapy with Taxotere/Carboplatin/Herceptin/Perjeta on 01/08/15.  HISTORY OF PRESENT ILLNESS:  patient returns for scheduled oncology evaluation and plan cycle 5 chemotherapy. States she is doing steady but has significant fatigability from ongoing treatment and feels that we should decrease the strength of chemo regimen. States left UE swelling is better, doppler on 6/15 was negative for DVT. Denies nausea or vomiting. No diarrhea. Appetite is good. Denies any new cough, dyspnea, chest pain, or hemoptysis. States she feels breast  mass is smaller on self-exam.    REVIEW OF SYSTEMS:   ROS As in HPI above. In addition, no new headaches, dizziness or epistaxis. No cough, shortness of breath. No wheezing or hemoptysis. No retrosternal chest pain. No orthopnea, PND. abdominal pain, nausea or vomiting. No diarrhea. No dysuria or hematuria. No new bone pain. No numbness or tingling of extremities. No polyuria or polydipsia. PS ECOG 1.  PAST MEDICAL HISTORY: Past Medical History  Diagnosis Date  . Hypertension   . Hyperlipidemia   . Heart attack 1998  . Cancer     PAST SURGICAL HISTORY: Past Surgical History  Procedure Laterality Date  . Breast surgery Left 12/20/14    breast biopsy    FAMILY HISTORY Family History  Problem Relation Age of Onset  . Breast cancer Maternal Aunt   . Breast cancer Cousin   . Brain cancer Maternal Uncle    SOCIAL HISTORY: History  Substance Use Topics  . Smoking status: Current Every Day Smoker -- 0.50 packs/day for 18 years    Types: Cigarettes  . Smokeless tobacco: Not on file  . Alcohol Use: No     Allergies  Allergen Reactions  . No Known Allergies     Current Outpatient Prescriptions  Medication Sig Dispense Refill  . aspirin 325 MG tablet Take 325 mg by mouth daily.    Marland Kitchen atenolol (TENORMIN) 25 MG tablet Take 1 tablet (25 mg total) by mouth daily. 14 tablet 0  . dexamethasone (DECADRON) 4 MG tablet Take 2 tablets (8 mg total) by mouth 2 (two) times daily. Take two tablets twice daily x 3 days starting on the day before each dose of Taxotere chemotherapy 36 tablet 0  . gabapentin (NEURONTIN) 100 MG capsule Take 2 capsules (200 mg total) by  mouth 3 (three) times daily. 180 capsule 3  . HYDROcodone-acetaminophen (NORCO/VICODIN) 5-325 MG per tablet Take 1 tablet by mouth every 4 (four) hours as needed for moderate pain.    Marland Kitchen lidocaine-prilocaine (EMLA) cream Apply to affected area once 30 g 3  . lisinopril (PRINIVIL,ZESTRIL) 10 MG tablet Take 1 tablet (10 mg total) by  mouth daily. 14 tablet 0  . simvastatin (ZOCOR) 40 MG tablet Take 1 tablet (40 mg total) by mouth daily at 6 PM. 14 tablet 0  . sulfamethoxazole-trimethoprim (BACTRIM DS) 800-160 MG per tablet Take 1 tablet by mouth 2 (two) times daily. 20 tablet 0   No current facility-administered medications for this visit.   Facility-Administered Medications Ordered in Other Visits  Medication Dose Route Frequency Provider Last Rate Last Dose  . 0.9 %  sodium chloride infusion   Intravenous Continuous [name], MD      . famotidine (PEPCID) IVPB 20 mg  20 mg Intravenous Q12H [name], MD   20 mg at 01/29/15 1230  . sodium chloride 0.45 % 1,000 mL infusion   Intravenous Continuous [name], MD 500 mL/hr at 04/04/15 1444    . sodium chloride 0.9 % injection 10 mL  10 mL Intravenous PRN [name], MD   10 mL at 04/04/15 1440    OBJECTIVE: Filed Vitals:   04/02/15 0941  BP:   Pulse:   Temp:   Resp: 18     Body mass index is 25.45 kg/(m^2).    ECOG FS:1 - Symptomatic but completely ambulatory  General: Well-developed, well-nourished, no acute distress. HEENT: Normocephalic, moist mucous membranes, clear oropharnyx. Lungs: Clear to auscultation bilaterally. Heart: Regular rate and rhythm.   Abdomen: Soft, nontender, no organomegaly noted  Ext: No edema, cyanosis, or clubbing. Neuro: grossly nonfocal, cranial nerves grossly intact. Breasts: left breast mass and left axillary node is much smaller. Exam performed in presence of a nurse.  LAB RESULTS: Cr 0.8, WBC 8.1, Hb 12.1, plt 370, ANC 1.8.     Lab Results  Component Value Date   LABCA2 48.8* 12/25/2014    ASSESSMENT/PLAN:   1. Invasive ductal carcinoma of the left breast grade 2 on biopsy  (clinically T2N1Mx, stage II disease). Tumor size 4.3 x 4.0 x 3.4 cm. Left axillary lymph node measuring 2.7 cm. Left axillary lymph node biopsy positive for involvement by poorly differentiated carcinoma consistent with breast  primary, left breast mass biopsy showed invasive ductal carcinoma grade 2, biopsy done on 12/18/2014. ER positive greater than 90%, PR positive 51% to 90%, HER2/neu positive (3+ on IHC). CT-guided liver biopsy 01/04/15 negative for malignancy. Patient started on Neoadjuvant chemotherapy with Taxotere/Carboplatin/Herceptin/Perjeta today (01/08/15) -  Reviewed labs from today and d/w patient. Clinically left breast mass and left axillary node is much smaller. Patient wants to decrease strength of chemo since she is having difficulty tolerating. Will proceed with cycle 5 chemotherapy with Taxotere 60 mg/m IV, Carboplatin AUC of 4 IV, Herceptin 6 mg/kg IV, Perjeta 420 mg IV today. Neulasta inj 6 mg SQ on day 3 given higher risk of febrile neutropenia and myelosuppression (20% or higher). Monitor weekly labs and follow-up in 3 weeks to plan cycle 6 treatment. After cycle 6, she will need to be referred back to surgeon at 2-3 weeks to plan breast surgery. Have explained that if she has breast conservation surgery she will then need to receive radiation therapy, to be followed by adjuvant homornal therapy given ER/PR positive disease. She also will complete the  remaining Herceptin course for a total of 52 weeks..  2. Pain - no issues at this time. 3. Nutrition - encouraged to eat better, and maintain an adequate protein-calorie intake. 4. In between visits, patient advised to call or come to the ER in case of fevers, bleeding, acute sickness or new symptoms. She is agreeable to above plan.  [name], MD   04/11/2015 10:14 AM

## 2015-04-14 ENCOUNTER — Other Ambulatory Visit: Payer: Self-pay | Admitting: *Deleted

## 2015-04-14 DIAGNOSIS — C50912 Malignant neoplasm of unspecified site of left female breast: Secondary | ICD-10-CM

## 2015-04-15 ENCOUNTER — Telehealth: Payer: Self-pay | Admitting: *Deleted

## 2015-04-15 NOTE — Telephone Encounter (Signed)
Called and left message with pt that it is time to check heart function again since she is on herceptin. appt for MUGA 7/21 a pm at [facility] location.  Will await her call back to see if she can make it.

## 2015-04-16 ENCOUNTER — Inpatient Hospital Stay: Payer: BLUE CROSS/BLUE SHIELD

## 2015-04-16 DIAGNOSIS — C50412 Malignant neoplasm of upper-outer quadrant of left female breast: Secondary | ICD-10-CM

## 2015-04-16 LAB — CBC WITH DIFFERENTIAL/PLATELET
Basophils Absolute: 0 10*3/uL (ref 0–0.1)
Basophils Relative: 1 %
EOS PCT: 0 %
Eosinophils Absolute: 0 10*3/uL (ref 0–0.7)
HEMATOCRIT: 33.2 % — AB (ref 35.0–47.0)
HEMOGLOBIN: 11.1 g/dL — AB (ref 12.0–16.0)
LYMPHS ABS: 1.6 10*3/uL (ref 1.0–3.6)
Lymphocytes Relative: 26 %
MCH: 32.3 pg (ref 26.0–34.0)
MCHC: 33.3 g/dL (ref 32.0–36.0)
MCV: 97.1 fL (ref 80.0–100.0)
MONOS PCT: 7 %
Monocytes Absolute: 0.4 10*3/uL (ref 0.2–0.9)
Neutro Abs: 4.2 10*3/uL (ref 1.4–6.5)
Neutrophils Relative %: 66 %
PLATELETS: 185 10*3/uL (ref 150–440)
RBC: 3.42 MIL/uL — AB (ref 3.80–5.20)
RDW: 18.9 % — ABNORMAL HIGH (ref 11.5–14.5)
WBC: 6.3 10*3/uL (ref 3.6–11.0)

## 2015-04-16 LAB — HEPATIC FUNCTION PANEL
ALBUMIN: 3.9 g/dL (ref 3.5–5.0)
ALK PHOS: 71 U/L (ref 38–126)
ALT: 20 U/L (ref 14–54)
AST: 24 U/L (ref 15–41)
BILIRUBIN INDIRECT: 0.6 mg/dL (ref 0.3–0.9)
Bilirubin, Direct: 0.1 mg/dL (ref 0.1–0.5)
TOTAL PROTEIN: 6.5 g/dL (ref 6.5–8.1)
Total Bilirubin: 0.7 mg/dL (ref 0.3–1.2)

## 2015-04-16 LAB — BASIC METABOLIC PANEL
ANION GAP: 11 (ref 5–15)
BUN: 8 mg/dL (ref 6–20)
CO2: 27 mmol/L (ref 22–32)
Calcium: 8.2 mg/dL — ABNORMAL LOW (ref 8.9–10.3)
Chloride: 101 mmol/L (ref 101–111)
Creatinine, Ser: 0.85 mg/dL (ref 0.44–1.00)
GFR calc Af Amer: >60 mL/min (ref >60)
GFR calc non Af Amer: >60 mL/min (ref >60)
Glucose, Bld: 98 mg/dL (ref 65–99)
Potassium: 3.2 mmol/L — ABNORMAL LOW (ref 3.5–5.1)
SODIUM: 139 mmol/L (ref 135–145)

## 2015-04-17 ENCOUNTER — Other Ambulatory Visit: Payer: Self-pay | Admitting: Internal Medicine

## 2015-04-17 DIAGNOSIS — C50412 Malignant neoplasm of upper-outer quadrant of left female breast: Secondary | ICD-10-CM

## 2015-04-17 LAB — CA 27.29 (SERIAL MONITOR): CA 27.29: 33.8 U/mL (ref 0.0–38.6)

## 2015-04-17 MED ORDER — POTASSIUM CHLORIDE CRYS ER 20 MEQ PO TBCR: 20.0000 meq | EXTENDED_RELEASE_TABLET | Freq: Every day | ORAL | Status: DC

## 2015-04-18 ENCOUNTER — Ambulatory Visit
Admission: RE | Admit: 2015-04-18 | Discharge: 2015-04-18 | Disposition: A | Discharge status: Home / Self Care | Payer: BLUE CROSS/BLUE SHIELD | Source: Ambulatory Visit | Attending: Internal Medicine | Admitting: Internal Medicine

## 2015-04-18 DIAGNOSIS — C50912 Malignant neoplasm of unspecified site of left female breast: Secondary | ICD-10-CM | POA: Diagnosis not present

## 2015-04-18 MED ORDER — TECHNETIUM TC 99M-LABELED RED BLOOD CELLS IV KIT
20.0000 | PACK | Freq: Once | INTRAVENOUS | Status: AC | PRN
  Administered 2015-04-18: 22.86 via INTRAVENOUS

## 2015-04-23 ENCOUNTER — Inpatient Hospital Stay: Payer: BLUE CROSS/BLUE SHIELD

## 2015-04-23 ENCOUNTER — Inpatient Hospital Stay (HOSPITAL_BASED_OUTPATIENT_CLINIC_OR_DEPARTMENT_OTHER): Payer: BLUE CROSS/BLUE SHIELD | Admitting: Oncology

## 2015-04-23 VITALS — BP 126/92 | HR 110 | Temp 98.6°F | Resp 18 | Wt 154.5 lb

## 2015-04-23 DIAGNOSIS — C773 Secondary and unspecified malignant neoplasm of axilla and upper limb lymph nodes: Secondary | ICD-10-CM | POA: Diagnosis not present

## 2015-04-23 DIAGNOSIS — R531 Weakness: Secondary | ICD-10-CM

## 2015-04-23 DIAGNOSIS — C50912 Malignant neoplasm of unspecified site of left female breast: Secondary | ICD-10-CM

## 2015-04-23 DIAGNOSIS — Z803 Family history of malignant neoplasm of breast: Secondary | ICD-10-CM

## 2015-04-23 DIAGNOSIS — C50412 Malignant neoplasm of upper-outer quadrant of left female breast: Secondary | ICD-10-CM

## 2015-04-23 DIAGNOSIS — Z170 Estrogen receptor positive status [ER+]: Secondary | ICD-10-CM

## 2015-04-23 DIAGNOSIS — E785 Hyperlipidemia, unspecified: Secondary | ICD-10-CM

## 2015-04-23 DIAGNOSIS — I10 Essential (primary) hypertension: Secondary | ICD-10-CM

## 2015-04-23 DIAGNOSIS — K769 Liver disease, unspecified: Secondary | ICD-10-CM | POA: Diagnosis not present

## 2015-04-23 DIAGNOSIS — I252 Old myocardial infarction: Secondary | ICD-10-CM

## 2015-04-23 DIAGNOSIS — G620 Drug-induced polyneuropathy: Secondary | ICD-10-CM

## 2015-04-23 DIAGNOSIS — R5383 Other fatigue: Secondary | ICD-10-CM

## 2015-04-23 DIAGNOSIS — T451X5S Adverse effect of antineoplastic and immunosuppressive drugs, sequela: Secondary | ICD-10-CM

## 2015-04-23 LAB — CBC WITH DIFFERENTIAL/PLATELET
BASOS ABS: 0 10*3/uL (ref 0–0.1)
Basophils Relative: 0 %
EOS PCT: 0 %
Eosinophils Absolute: 0 10*3/uL (ref 0–0.7)
HEMATOCRIT: 36.5 % (ref 35.0–47.0)
Hemoglobin: 11.9 g/dL — ABNORMAL LOW (ref 12.0–16.0)
LYMPHS ABS: 0.4 10*3/uL — AB (ref 1.0–3.6)
Lymphocytes Relative: 9 %
MCH: 32.1 pg (ref 26.0–34.0)
MCHC: 32.6 g/dL (ref 32.0–36.0)
MCV: 98.4 fL (ref 80.0–100.0)
MONOS PCT: 3 %
Monocytes Absolute: 0.2 10*3/uL (ref 0.2–0.9)
Neutro Abs: 4.3 10*3/uL (ref 1.4–6.5)
Neutrophils Relative %: 88 %
Platelets: 340 10*3/uL (ref 150–440)
RBC: 3.71 MIL/uL — ABNORMAL LOW (ref 3.80–5.20)
RDW: 18.4 % — ABNORMAL HIGH (ref 11.5–14.5)
WBC: 4.9 10*3/uL (ref 3.6–11.0)

## 2015-04-23 LAB — BASIC METABOLIC PANEL
ANION GAP: 7 (ref 5–15)
BUN: 17 mg/dL (ref 6–20)
CHLORIDE: 103 mmol/L (ref 101–111)
CO2: 27 mmol/L (ref 22–32)
Calcium: 9.3 mg/dL (ref 8.9–10.3)
Creatinine, Ser: 1.14 mg/dL — ABNORMAL HIGH (ref 0.44–1.00)
GFR, EST AFRICAN AMERICAN: 59 mL/min — AB (ref >60)
GFR, EST NON AFRICAN AMERICAN: 51 mL/min — AB (ref >60)
Glucose, Bld: 127 mg/dL — ABNORMAL HIGH (ref 65–99)
Potassium: 4.2 mmol/L (ref 3.5–5.1)
Sodium: 137 mmol/L (ref 135–145)

## 2015-04-23 MED ORDER — SODIUM CHLORIDE 0.9 % IV SOLN
Freq: Once | INTRAVENOUS | Status: AC
  Administered 2015-04-23: 12:00:00 via INTRAVENOUS
  Filled 2015-04-23: qty 5

## 2015-04-23 MED ORDER — PERTUZUMAB CHEMO INJECTION 420 MG/14ML
420.0000 mg | Freq: Once | INTRAVENOUS | Status: AC
  Administered 2015-04-23: 420 mg via INTRAVENOUS
  Filled 2015-04-23: qty 14

## 2015-04-23 MED ORDER — SODIUM CHLORIDE 0.9 % IJ SOLN
10.0000 mL | INTRAMUSCULAR | Status: DC | PRN
  Administered 2015-04-23: 10 mL via INTRAVENOUS
  Filled 2015-04-23: qty 10

## 2015-04-23 MED ORDER — SODIUM CHLORIDE 0.9 % IV SOLN
Freq: Once | INTRAVENOUS | Status: DC
  Filled 2015-04-23: qty 1000

## 2015-04-23 MED ORDER — ACETAMINOPHEN 325 MG PO TABS
650.0000 mg | ORAL_TABLET | Freq: Once | ORAL | Status: AC
  Administered 2015-04-23: 650 mg via ORAL
  Filled 2015-04-23: qty 2

## 2015-04-23 MED ORDER — SODIUM CHLORIDE 0.9 % IV SOLN
Freq: Once | INTRAVENOUS | Status: AC
  Administered 2015-04-23: 11:00:00 via INTRAVENOUS
  Filled 2015-04-23: qty 1000

## 2015-04-23 MED ORDER — TRASTUZUMAB CHEMO INJECTION 440 MG
440.0000 mg | Freq: Once | INTRAVENOUS | Status: AC
  Administered 2015-04-23: 440 mg via INTRAVENOUS
  Filled 2015-04-23: qty 20.95

## 2015-04-23 MED ORDER — PALONOSETRON HCL INJECTION 0.25 MG/5ML
0.2500 mg | Freq: Once | INTRAVENOUS | Status: AC
  Administered 2015-04-23: 0.25 mg via INTRAVENOUS
  Filled 2015-04-23: qty 5

## 2015-04-23 MED ORDER — DIPHENHYDRAMINE HCL 50 MG/ML IJ SOLN
25.0000 mg | Freq: Once | INTRAMUSCULAR | Status: AC
  Administered 2015-04-23: 25 mg via INTRAVENOUS
  Filled 2015-04-23: qty 1

## 2015-04-23 MED ORDER — FAMOTIDINE IN NACL 20-0.9 MG/50ML-% IV SOLN
20.0000 mg | Freq: Two times a day (BID) | INTRAVENOUS | Status: DC
  Administered 2015-04-23: 20 mg via INTRAVENOUS
  Filled 2015-04-23: qty 50

## 2015-04-23 MED ORDER — DOCETAXEL CHEMO INJECTION 160 MG/16ML
60.0000 mg/m2 | Freq: Once | INTRAVENOUS | Status: AC
  Administered 2015-04-23: 110 mg via INTRAVENOUS
  Filled 2015-04-23: qty 11

## 2015-04-23 MED ORDER — SODIUM CHLORIDE 0.9 % IV SOLN
344.8000 mg | Freq: Once | INTRAVENOUS | Status: AC
  Administered 2015-04-23: 340 mg via INTRAVENOUS
  Filled 2015-04-23: qty 34

## 2015-04-23 MED ORDER — HEPARIN SOD (PORK) LOCK FLUSH 100 UNIT/ML IV SOLN
500.0000 [IU] | Freq: Once | INTRAVENOUS | Status: AC
  Administered 2015-04-23: 500 [IU] via INTRAVENOUS
  Filled 2015-04-23: qty 5

## 2015-04-23 NOTE — Progress Notes (Signed)
Patient has neuropathy in her hands and feet that is painful reports having trouble with daily activities.  Usually gets IVF when she returns on Thursdays for Neupogen injection and had to return twice for IVF for the last 3 treatments.

## 2015-04-24 ENCOUNTER — Inpatient Hospital Stay: Payer: BLUE CROSS/BLUE SHIELD

## 2015-04-24 ENCOUNTER — Ambulatory Visit: Payer: BLUE CROSS/BLUE SHIELD

## 2015-04-24 ENCOUNTER — Other Ambulatory Visit: Payer: BLUE CROSS/BLUE SHIELD | Admitting: *Deleted

## 2015-04-24 VITALS — BP 126/72 | HR 76 | Temp 98.6°F | Resp 18

## 2015-04-24 DIAGNOSIS — C50412 Malignant neoplasm of upper-outer quadrant of left female breast: Secondary | ICD-10-CM | POA: Diagnosis not present

## 2015-04-24 DIAGNOSIS — E860 Dehydration: Secondary | ICD-10-CM

## 2015-04-24 MED ORDER — SODIUM CHLORIDE 0.9 % IV SOLN
INTRAVENOUS | Status: AC
  Administered 2015-04-24 – 2015-05-23 (×2): via INTRAVENOUS
  Filled 2015-04-24: qty 1000

## 2015-04-24 MED ORDER — SODIUM CHLORIDE 0.9 % IV SOLN
INTRAVENOUS | Status: DC
  Administered 2015-04-24: 15:00:00 via INTRAVENOUS
  Filled 2015-04-24: qty 1000

## 2015-04-24 MED ORDER — HEPARIN SOD (PORK) LOCK FLUSH 100 UNIT/ML IV SOLN
500.0000 [IU] | Freq: Once | INTRAVENOUS | Status: AC
  Administered 2015-04-24: 500 [IU] via INTRAVENOUS

## 2015-04-24 MED ORDER — SODIUM CHLORIDE 0.9 % IJ SOLN
10.0000 mL | INTRAMUSCULAR | Status: DC | PRN
  Administered 2015-04-24: 10 mL via INTRAVENOUS
  Filled 2015-04-24: qty 10

## 2015-04-24 MED ORDER — HEPARIN SOD (PORK) LOCK FLUSH 100 UNIT/ML IV SOLN
INTRAVENOUS | Status: AC
  Filled 2015-04-24: qty 5

## 2015-04-25 ENCOUNTER — Ambulatory Visit: Payer: BLUE CROSS/BLUE SHIELD

## 2015-04-25 ENCOUNTER — Inpatient Hospital Stay: Payer: BLUE CROSS/BLUE SHIELD

## 2015-04-25 VITALS — BP 140/80 | HR 72 | Temp 97.1°F

## 2015-04-25 DIAGNOSIS — C50412 Malignant neoplasm of upper-outer quadrant of left female breast: Secondary | ICD-10-CM

## 2015-04-25 DIAGNOSIS — E860 Dehydration: Secondary | ICD-10-CM

## 2015-04-25 DIAGNOSIS — Z95828 Presence of other vascular implants and grafts: Secondary | ICD-10-CM

## 2015-04-25 MED ORDER — SODIUM CHLORIDE 0.9 % IJ SOLN
10.0000 mL | INTRAMUSCULAR | Status: DC | PRN
  Administered 2015-04-25: 10 mL via INTRAVENOUS
  Filled 2015-04-25: qty 10

## 2015-04-25 MED ORDER — SODIUM CHLORIDE 0.9 % IV SOLN
Freq: Once | INTRAVENOUS | Status: AC
  Administered 2015-04-25: 14:00:00 via INTRAVENOUS
  Filled 2015-04-25: qty 1000

## 2015-04-25 MED ORDER — PEGFILGRASTIM INJECTION 6 MG/0.6ML ~~LOC~~
6.0000 mg | PREFILLED_SYRINGE | Freq: Once | SUBCUTANEOUS | Status: AC
  Administered 2015-04-25: 6 mg via SUBCUTANEOUS
  Filled 2015-04-25: qty 0.6

## 2015-04-25 MED ORDER — HEPARIN SOD (PORK) LOCK FLUSH 100 UNIT/ML IV SOLN
500.0000 [IU] | Freq: Once | INTRAVENOUS | Status: AC
  Administered 2015-04-25: 500 [IU] via INTRAVENOUS
  Filled 2015-04-25: qty 5

## 2015-04-28 ENCOUNTER — Encounter: Payer: Self-pay | Admitting: Emergency Medicine

## 2015-04-28 ENCOUNTER — Other Ambulatory Visit: Payer: Self-pay

## 2015-04-28 ENCOUNTER — Emergency Department
Admission: EM | Admit: 2015-04-28 | Discharge: 2015-04-28 | Disposition: A | Discharge status: Home / Self Care | Payer: BLUE CROSS/BLUE SHIELD | Attending: Emergency Medicine | Admitting: Emergency Medicine

## 2015-04-28 DIAGNOSIS — R110 Nausea: Secondary | ICD-10-CM | POA: Insufficient documentation

## 2015-04-28 DIAGNOSIS — M8548 Solitary bone cyst, other site: Secondary | ICD-10-CM | POA: Diagnosis not present

## 2015-04-28 DIAGNOSIS — Z7982 Long term (current) use of aspirin: Secondary | ICD-10-CM | POA: Insufficient documentation

## 2015-04-28 DIAGNOSIS — Z720 Tobacco use: Secondary | ICD-10-CM | POA: Insufficient documentation

## 2015-04-28 DIAGNOSIS — C50412 Malignant neoplasm of upper-outer quadrant of left female breast: Secondary | ICD-10-CM | POA: Insufficient documentation

## 2015-04-28 DIAGNOSIS — R531 Weakness: Secondary | ICD-10-CM | POA: Diagnosis present

## 2015-04-28 DIAGNOSIS — Z79899 Other long term (current) drug therapy: Secondary | ICD-10-CM | POA: Insufficient documentation

## 2015-04-28 DIAGNOSIS — E860 Dehydration: Secondary | ICD-10-CM

## 2015-04-28 DIAGNOSIS — I10 Essential (primary) hypertension: Secondary | ICD-10-CM | POA: Insufficient documentation

## 2015-04-28 LAB — COMPREHENSIVE METABOLIC PANEL
ALBUMIN: 4 g/dL (ref 3.5–5.0)
ALT: 32 U/L (ref 14–54)
ANION GAP: 10 (ref 5–15)
AST: 24 U/L (ref 15–41)
Alkaline Phosphatase: 68 U/L (ref 38–126)
BUN: 15 mg/dL (ref 6–20)
CO2: 28 mmol/L (ref 22–32)
CREATININE: 0.6 mg/dL (ref 0.44–1.00)
Calcium: 8.8 mg/dL — ABNORMAL LOW (ref 8.9–10.3)
Chloride: 96 mmol/L — ABNORMAL LOW (ref 101–111)
Glucose, Bld: 100 mg/dL — ABNORMAL HIGH (ref 65–99)
Potassium: 3.4 mmol/L — ABNORMAL LOW (ref 3.5–5.1)
Sodium: 134 mmol/L — ABNORMAL LOW (ref 135–145)
Total Bilirubin: 1.1 mg/dL (ref 0.3–1.2)
Total Protein: 6.7 g/dL (ref 6.5–8.1)

## 2015-04-28 LAB — CBC
HCT: 39 % (ref 35.0–47.0)
Hemoglobin: 13 g/dL (ref 12.0–16.0)
MCH: 32.8 pg (ref 26.0–34.0)
MCHC: 33.4 g/dL (ref 32.0–36.0)
MCV: 98.2 fL (ref 80.0–100.0)
Platelets: 102 10*3/uL — ABNORMAL LOW (ref 150–440)
RBC: 3.97 MIL/uL (ref 3.80–5.20)
RDW: 16.8 % — ABNORMAL HIGH (ref 11.5–14.5)
WBC: 12.1 10*3/uL — ABNORMAL HIGH (ref 3.6–11.0)

## 2015-04-28 LAB — LIPASE, BLOOD: LIPASE: 38 U/L (ref 22–51)

## 2015-04-28 LAB — TROPONIN I: Troponin I: <0.03 ng/mL (ref <0.031)

## 2015-04-28 MED ORDER — SODIUM CHLORIDE 0.9 % IV SOLN
1000.0000 mL | Freq: Once | INTRAVENOUS | Status: AC
  Administered 2015-04-28: 1000 mL via INTRAVENOUS
  Filled 2015-04-28: qty 1000

## 2015-04-28 MED ORDER — ONDANSETRON HCL 4 MG/2ML IJ SOLN
4.0000 mg | Freq: Once | INTRAMUSCULAR | Status: AC
  Administered 2015-04-28: 4 mg via INTRAVENOUS
  Filled 2015-04-28: qty 2

## 2015-04-28 MED ORDER — HEPARIN SOD (PORK) LOCK FLUSH 100 UNIT/ML IV SOLN
INTRAVENOUS | Status: AC
  Administered 2015-04-28: 10:00:00
  Filled 2015-04-28: qty 5

## 2015-04-28 NOTE — Discharge Instructions (Signed)
Dehydration, Adult Dehydration is when you lose more fluids from the body than you take in. Vital organs like the kidneys, brain, and heart cannot function without a proper amount of fluids and salt. Any loss of fluids from the body can cause dehydration.  CAUSES   Vomiting.  Diarrhea.  Excessive sweating.  Excessive urine output.  Fever. SYMPTOMS  Mild dehydration  Thirst.  Dry lips.  Slightly dry mouth. Moderate dehydration  Very dry mouth.  Sunken eyes.  Skin does not bounce back quickly when lightly pinched and released.  Dark urine and decreased urine production.  Decreased tear production.  Headache. Severe dehydration  Very dry mouth.  Extreme thirst.  Rapid, weak pulse (more than 100 beats per minute at rest).  Cold hands and feet.  Not able to sweat in spite of heat and temperature.  Rapid breathing.  Blue lips.  Confusion and lethargy.  Difficulty being awakened.  Minimal urine production.  No tears. DIAGNOSIS  Your caregiver will diagnose dehydration based on your symptoms and your exam. Blood and urine tests will help confirm the diagnosis. The diagnostic evaluation should also identify the cause of dehydration. TREATMENT  Treatment of mild or moderate dehydration can often be done at home by increasing the amount of fluids that you drink. It is best to drink small amounts of fluid more often. Drinking too much at one time can make vomiting worse. Refer to the home care instructions below. Severe dehydration needs to be treated at the hospital where you will probably be given intravenous (IV) fluids that contain water and electrolytes. HOME CARE INSTRUCTIONS   Ask your caregiver about specific rehydration instructions.  Drink enough fluids to keep your urine clear or pale yellow.  Drink small amounts frequently if you have nausea and vomiting.  Eat as you normally do.  Avoid:  Foods or drinks high in sugar.  Carbonated  drinks.  Juice.  Extremely hot or cold fluids.  Drinks with caffeine.  Fatty, greasy foods.  Alcohol.  Tobacco.  Overeating.  Gelatin desserts.  Wash your hands well to avoid spreading bacteria and viruses.  Only take over-the-counter or prescription medicines for pain, discomfort, or fever as directed by your caregiver.  Ask your caregiver if you should continue all prescribed and over-the-counter medicines.  Keep all follow-up appointments with your caregiver. SEEK MEDICAL CARE IF:  You have abdominal pain and it increases or stays in one area (localizes).  You have a rash, stiff neck, or severe headache.  You are irritable, sleepy, or difficult to awaken.  You are weak, dizzy, or extremely thirsty. SEEK IMMEDIATE MEDICAL CARE IF:   You are unable to keep fluids down or you get worse despite treatment.  You have frequent episodes of vomiting or diarrhea.  You have blood or green matter (bile) in your vomit.  You have blood in your stool or your stool looks black and tarry.  You have not urinated in 6 to 8 hours, or you have only urinated a small amount of very dark urine.  You have a fever.  You faint. MAKE SURE YOU:   Understand these instructions.  Will watch your condition.  Will get help right away if you are not doing well or get worse. Document Released: 09/14/2005 Document Revised: 12/07/2011 Document Reviewed: 05/04/2011 Exit[employer] Patient Information 2015 [employer]. This information is not intended to replace advice given to you by your health care provider. Make sure you discuss any questions you have with your health care   provider.  

## 2015-04-28 NOTE — ED Provider Notes (Signed)
[facility] Emergency Department Provider Note  ____________________________________________  Time seen: 7:30 AM  I have reviewed the triage vital signs and the nursing notes.   HISTORY  Chief Complaint Dehydration    HPI Sarah Horne is a 59 y.o. female percent with complaints of dehydration. She reports she recently had IV chemotherapy5 days ago her breast cancer and she notes she always feels ill after chemotherapy. She reports this is her last course of chemotherapy she denies vomiting but has felt nauseous and dizzy and weak. No fevers or chills. No abdominal pain, no diarrhea, no chest pain     Past Medical History  Diagnosis Date  . Hypertension   . Hyperlipidemia   . Heart attack 1998  . Cancer     Patient Active Problem List   Diagnosis Date Noted  . Breast cancer of upper-outer quadrant of left female breast 12/27/2014    Past Surgical History  Procedure Laterality Date  . Breast surgery Left 12/20/14    breast biopsy    Current Outpatient Rx  Name  Route  Sig  Dispense  Refill  . aspirin 325 MG tablet   Oral   Take 325 mg by mouth daily.         Marland Kitchen atenolol (TENORMIN) 25 MG tablet   Oral   Take 1 tablet (25 mg total) by mouth daily.   14 tablet   0   . gabapentin (NEURONTIN) 100 MG capsule   Oral   Take 2 capsules (200 mg total) by mouth 3 (three) times daily.   180 capsule   3   . lisinopril (PRINIVIL,ZESTRIL) 10 MG tablet   Oral   Take 1 tablet (10 mg total) by mouth daily.   14 tablet   0   . potassium chloride SA (K-DUR,KLOR-CON) 20 MEQ tablet   Oral   Take 1 tablet (20 mEq total) by mouth daily.   8 tablet   0   . simvastatin (ZOCOR) 40 MG tablet   Oral   Take 1 tablet (40 mg total) by mouth daily at 6 PM.   14 tablet   0   . dexamethasone (DECADRON) 4 MG tablet   Oral   Take 2 tablets (8 mg total) by mouth 2 (two) times daily. Take two tablets twice daily x 3 days starting on the day before  each dose of Taxotere chemotherapy Patient not taking: Reported on 04/28/2015   36 tablet   0   . HYDROcodone-acetaminophen (NORCO/VICODIN) 5-325 MG per tablet   Oral   Take 1 tablet by mouth every 4 (four) hours as needed for moderate pain.         Marland Kitchen lidocaine-prilocaine (EMLA) cream      Apply to affected area once Patient not taking: Reported on 04/28/2015   30 g   3     Allergies No known allergies  Family History  Problem Relation Age of Onset  . Breast cancer Maternal Aunt   . Breast cancer Cousin   . Brain cancer Maternal Uncle     Social History History  Substance Use Topics  . Smoking status: Current Every Day Smoker -- 0.50 packs/day for 18 years    Types: Cigarettes  . Smokeless tobacco: Not on file  . Alcohol Use: No    Review of Systems  Constitutional: Negative for fever. Eyes: Negative for visual changes. ENT: Negative for sore throat Cardiovascular: Negative for chest pain. Respiratory: Negative for shortness of breath. Gastrointestinal: Positive  for nausea Genitourinary: Negative for dysuria. Musculoskeletal: Negative for back pain. Skin: Negative for rash. Neurological: Positive for dizziness Psychiatric: No anxiety    ____________________________________________   PHYSICAL EXAM:  VITAL SIGNS: ED Triage Vitals  Enc Vitals Group     BP 04/28/15 0553 115/80 mmHg     Pulse Rate 04/28/15 0553 107     Resp 04/28/15 0553 20     Temp 04/28/15 0553 98.3 F (36.8 C)     Temp Source 04/28/15 0553 Oral     SpO2 04/28/15 0553 99 %     Weight 04/28/15 0553 155 lb (70.308 kg)     Height 04/28/15 0553 '5\' 6"'$  (1.676 m)     Head Cir --      Peak Flow --      Pain Score --      Pain Loc --      Pain Edu? --      Excl. in [place]? --      Constitutional: Alert and oriented. Well appearing and in no distress. Eyes: Conjunctivae are normal.  ENT   Head: Normocephalic and atraumatic.   Mouth/Throat: Mucous membranes are  moist. Cardiovascular: Normal rate, regular rhythm. Normal and symmetric distal pulses are present in all extremities. No murmurs, rubs, or gallops. Right chest port Respiratory: Normal respiratory effort without tachypnea nor retractions. Breath sounds are clear and equal bilaterally.  Gastrointestinal: Soft and non-tender in all quadrants. No distention. There is no CVA tenderness. Genitourinary: deferred Musculoskeletal: Nontender with normal range of motion in all extremities. No lower extremity tenderness nor edema. Right wrist cyst, chronic Neurologic:  Normal speech and language. No gross focal neurologic deficits are appreciated. Skin:  Skin is warm, dry and intact. No rash noted. Psychiatric: Mood and affect are normal. Patient exhibits appropriate insight and judgment.  ____________________________________________    LABS (pertinent positives/negatives)  Labs Reviewed  COMPREHENSIVE METABOLIC PANEL - Abnormal; Notable for the following:    Sodium 134 (*)    Potassium 3.4 (*)    Chloride 96 (*)    Glucose, Bld 100 (*)    Calcium 8.8 (*)    All other components within normal limits  CBC - Abnormal; Notable for the following:    WBC 12.1 (*)    RDW 16.8 (*)    Platelets 102 (*)    All other components within normal limits  LIPASE, BLOOD  TROPONIN I    ____________________________________________   EKG  ED ECG REPORT I, Lavonia Drafts, the attending physician, personally viewed and interpreted this ECG.  Date: 04/28/2015 EKG Time: 8:05 AM Rate: 84 Rhythm: normal sinus rhythm QRS Axis: normal Intervals: normal ST/T Wave abnormalities: normal Conduction Disutrbances: none Narrative Interpretation: unremarkable   ____________________________________________    RADIOLOGY I have personally reviewed any xrays that were ordered on this patient: None  ____________________________________________   PROCEDURES  Procedure(s) performed: none  Critical Care  performed: none  ____________________________________________   INITIAL IMPRESSION / ASSESSMENT AND PLAN / ED COURSE  Pertinent labs & imaging results that were available during my care of the patient were reviewed by me and considered in my medical decision making (see chart for details).  Patient presents with complaints of dehydration and indeed she is tachycardic. She has no abdominal tenderness to palpation and denies diarrhea. We will give IV fluids check labs and reevaluate.  ----------------------------------------- 9:47 AM on 04/28/2015 -----------------------------------------  After 1 L of normal saline and Zofran patient reports she feels markedly better. Her labs are fairly unremarkable  although she has a mildly elevated white blood cell count which is very nonspecific. She is afebrile. She is anxious to go home. We'll discharge her with follow-up with Dr. Ma Hillock of oncology  ____________________________________________   FINAL CLINICAL IMPRESSION(S) / ED DIAGNOSES  Final diagnoses:  Dehydration     Lavonia Drafts, MD 04/28/15 [phone]

## 2015-04-28 NOTE — ED Notes (Signed)
Pt has port, no blood work drawn in triage.

## 2015-04-28 NOTE — ED Notes (Signed)
Pt presents to ER alert and in NAD. pt states she recently had IV chemo and she usually gets dehydrated after. Pt denies vomiting. Pt states she feels dehydrated. Triaged by Pryor Curia RN.

## 2015-04-30 NOTE — Progress Notes (Signed)
[place]  Telephone:(336) [phone] Fax:(336) [phone]     ID: Charisa Twitty OB: 1956-07-16  MR#: [id number]  VZD#:[id number]  Patient Care Team: [name], MD as PCP - General (Family Medicine) [name], MD (General Surgery) [name], MD as Attending Physician (Internal Medicine)  CHIEF COMPLAINT/DIAGNOSIS:   Invasive ductal carcinoma of the left breast grade 2 on biopsy  (clinically T2N1Mx, stage II disease). Tumor size 4.3 x 4.0 x 3.4 cm. Left axillary lymph node measuring 2.7 cm. Left axillary lymph node biopsy positive for involvement by poorly differentiated carcinoma consistent with breast primary, left breast mass biopsy showed invasive ductal carcinoma grade 2, biopsy done on 12/18/2014. ER positive greater than 90%, PR positive 51% to 90%, HER2/neu positive (3+ on IHC). PET scan 12/31/14 - IMPRESSION: 1. Hypermetabolic left breast mass consists with primary carcinoma. 2. Evidence of local nodal metastasis to the left axilla. 3. No evidence of central nodal metastasis to mediastinum. 4. Two lesions in liver with mild metabolic activity. Differential would include focal nodular hyperplasia versus metastatic breast cancer. Recommend either a contrast-enhanced liver MRI or for added specificity ultrasound-guided hepatic biopsy. CT-guided liver biopsy 01/04/15 - negative for malignancy.  Patient started on Neoadjuvant chemotherapy with Taxotere/Carboplatin/Herceptin/Perjeta on 01/08/15.  HISTORY OF PRESENT ILLNESS:  patient returns for scheduled oncology evaluation and plan cycle 6 chemotherapy.  She is tolerating her treatments relatively well other than some increased weakness and fatigue between treatments. She also has a worsening neuropathy. She has no other neurologic complaints. She denies any fevers. She has a fair appetite. She denies any chest pain or shortness of breath. She denies any nausea, vomiting, constipation, or diarrhea. Patient  otherwise feels well and offers no further specific complaints.   REVIEW OF SYSTEMS:   Review of Systems  Constitutional: Negative.   Respiratory: Negative.   Cardiovascular: Negative.   Musculoskeletal: Negative.   Neurological: Positive for sensory change.    PAST MEDICAL HISTORY: Past Medical History  Diagnosis Date  . Hypertension   . Hyperlipidemia   . Heart attack 1998  . Cancer     PAST SURGICAL HISTORY: Past Surgical History  Procedure Laterality Date  . Breast surgery Left 12/20/14    breast biopsy    FAMILY HISTORY Family History  Problem Relation Age of Onset  . Breast cancer Maternal Aunt   . Breast cancer Cousin   . Brain cancer Maternal Uncle    SOCIAL HISTORY: History  Substance Use Topics  . Smoking status: Current Every Day Smoker -- 0.50 packs/day for 18 years    Types: Cigarettes  . Smokeless tobacco: Not on file  . Alcohol Use: No     Allergies  Allergen Reactions  . No Known Allergies     Current Outpatient Prescriptions  Medication Sig Dispense Refill  . aspirin 325 MG tablet Take 325 mg by mouth daily.    Marland Kitchen atenolol (TENORMIN) 25 MG tablet Take 1 tablet (25 mg total) by mouth daily. 14 tablet 0  . dexamethasone (DECADRON) 4 MG tablet Take 2 tablets (8 mg total) by mouth 2 (two) times daily. Take two tablets twice daily x 3 days starting on the day before each dose of Taxotere chemotherapy (Patient not taking: Reported on 04/28/2015) 36 tablet 0  . gabapentin (NEURONTIN) 100 MG capsule Take 2 capsules (200 mg total) by mouth 3 (three) times daily. 180 capsule 3  . HYDROcodone-acetaminophen (NORCO/VICODIN) 5-325 MG per tablet Take 1 tablet by mouth every 4 (  four) hours as needed for moderate pain.    Marland Kitchen lidocaine-prilocaine (EMLA) cream Apply to affected area once (Patient not taking: Reported on 04/28/2015) 30 g 3  . lisinopril (PRINIVIL,ZESTRIL) 10 MG tablet Take 1 tablet (10 mg total) by mouth daily. 14 tablet 0  . potassium chloride SA  (K-DUR,KLOR-CON) 20 MEQ tablet Take 1 tablet (20 mEq total) by mouth daily. 8 tablet 0  . simvastatin (ZOCOR) 40 MG tablet Take 1 tablet (40 mg total) by mouth daily at 6 PM. 14 tablet 0   Current Facility-Administered Medications  Medication Dose Route Frequency Provider Last Rate Last Dose  . 0.9 %  sodium chloride infusion   Intravenous Once Lloyd Huger, MD      . 0.9 %  sodium chloride infusion   Intravenous Once Lloyd Huger, MD       Facility-Administered Medications Ordered in Other Visits  Medication Dose Route Frequency Provider Last Rate Last Dose  . 0.9 %  sodium chloride infusion   Intravenous Continuous [name], MD      . 0.9 %  sodium chloride infusion   Intravenous Continuous Lloyd Huger, MD 999 mL/hr at 04/24/15 1520    . famotidine (PEPCID) IVPB 20 mg  20 mg Intravenous Q12H [name], MD   20 mg at 01/29/15 1230  . sodium chloride 0.45 % 1,000 mL infusion   Intravenous Continuous [name], MD 500 mL/hr at 04/04/15 1444    . sodium chloride 0.9 % injection 10 mL  10 mL Intravenous PRN [name], MD   10 mL at 04/04/15 1440    OBJECTIVE: Filed Vitals:   04/23/15 0928  BP: 126/92  Pulse: 110  Temp: 98.6 F (37 C)  Resp: 18     Body mass index is 24.96 kg/(m^2).    ECOG FS:1 - Symptomatic but completely ambulatory  General: Well-developed, well-nourished, no acute distress. HEENT: Normocephalic, moist mucous membranes, clear oropharnyx. Lungs: Clear to auscultation bilaterally. Heart: Regular rate and rhythm.   Abdomen: Soft, nontender, no organomegaly noted  Ext: No edema, cyanosis, or clubbing. Neuro: grossly nonfocal, cranial nerves grossly intact. Breasts: left breast mass and left axillary node is much smaller. Exam deferred today.  LAB RESULTS: Cr 0.8, WBC 8.1, Hb 12.1, plt 370, ANC 1.8.     Lab Results  Component Value Date   LABCA2 33.8 04/16/2015    ASSESSMENT/PLAN:   1. Invasive ductal carcinoma of the  left breast grade 2 on biopsy  (clinically T2N1Mx, stage II disease). Tumor size 4.3 x 4.0 x 3.4 cm. Left axillary lymph node measuring 2.7 cm. Left axillary lymph node biopsy positive for involvement by poorly differentiated carcinoma consistent with breast primary, left breast mass biopsy showed invasive ductal carcinoma grade 2, biopsy done on 12/18/2014. ER positive greater than 90%, PR positive 51% to 90%, HER2/neu positive (3+ on IHC). CT-guided liver biopsy 01/04/15 negative for malignancy. Patient started on Neoadjuvant chemotherapy with Taxotere/Carboplatin/Herceptin/Perjeta today (01/08/15) -  Reviewed labs from today and d/w patient. Clinically left breast mass and left axillary node is much smaller.   Will proceed with cycle 6 chemotherapy with Taxotere 60 mg/m IV, Carboplatin AUC of 4 IV, Herceptin 6 mg/kg IV, Perjeta 420 mg IV today. Neulasta inj 6 mg SQ on day 3 given higher risk of febrile neutropenia and myelosuppression (20% or higher).  Patient will also return to clinic tomorrow and Friday to receive IV fluids. Monitor weekly labs and follow-up in 3 weeks for treatment with  Herceptin only.  Patient states she has an appointment with her surgeon in the next several weeks.  Have explained that if she has breast conservation surgery she will then need to receive radiation therapy, to be followed by adjuvant homornal therapy given ER/PR positive disease. She also will complete the remaining Herceptin course for a total of 52 weeks..  2. Pain - no issues at this time. 3. Nutrition - encouraged to eat better, and maintain an adequate protein-calorie intake. 4. In between visits, patient advised to call or come to the ER in case of fevers, bleeding, acute sickness or new symptoms. She is agreeable to above plan.  Lloyd Huger, MD   04/30/2015 2:35 PM

## 2015-05-01 ENCOUNTER — Ambulatory Visit (INDEPENDENT_AMBULATORY_CARE_PROVIDER_SITE_OTHER): Payer: BLUE CROSS/BLUE SHIELD | Admitting: General Surgery

## 2015-05-01 ENCOUNTER — Encounter: Payer: Self-pay | Admitting: General Surgery

## 2015-05-01 ENCOUNTER — Other Ambulatory Visit: Payer: BLUE CROSS/BLUE SHIELD

## 2015-05-01 VITALS — BP 124/70 | HR 74 | Resp 14 | Ht 66.0 in | Wt 158.0 lb

## 2015-05-01 DIAGNOSIS — C50912 Malignant neoplasm of unspecified site of left female breast: Secondary | ICD-10-CM

## 2015-05-01 NOTE — Progress Notes (Signed)
Patient ID: Sarah Horne, female DOB: 12/04/1955, 59 y.o. MRN: [id number]  Chief Complaint   Patient presents with   .  Other     discuss surgical options    HPI  Sarah Horne is a 59 y.o. female here today to discuss surgical options. Patient last chemo was on 04/23/15.  The patient was last seen in March 2016 at which time a PowerPort was placed for administration of neo-adjuvant chemotherapy. The patient has tolerated the therapy well with modest peripheral neuropathy. She has continued to work full-time, reports that she is missed only 6 days of work due to dehydration or neutropenia. 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  HPI  Past Medical History   Diagnosis  Date   .  Hypertension    .  Hyperlipidemia    .  Heart attack  1998   .  Cancer  2016     Stage II, ER positive, PR positive, HER-2/neu overexpressing of the left breast.    Past Surgical History   Procedure  Laterality  Date   .  Portacath placement  Right  12-31-14     Dr Zamariah Seaborn   .  Breast surgery  Left  12/18/14     breast biopsy/INVASIVE DUCTAL CARCINOMA OF BREAST, NOTTINGHAM GRADE 2.    Family History   Problem  Relation  Age of Onset   .  Breast cancer  Maternal Aunt    .  Breast cancer  Cousin    .  Brain cancer  Maternal Uncle     Social History  History   Substance Use Topics   .  Smoking status:  Current Every Day Smoker -- 0.50 packs/day for 18 years     Types:  Cigarettes   .  Smokeless tobacco:  Not on file   .  Alcohol Use:  No    Allergies   Allergen  Reactions   .  No Known Allergies     Current Outpatient Prescriptions   Medication  Sig  Dispense  Refill   .  aspirin 325 MG tablet  Take 325 mg by mouth daily.     .  atenolol (TENORMIN) 25 MG tablet  Take 1 tablet (25 mg total) by mouth daily.  14 tablet  0   .  dexamethasone (DECADRON) 4 MG tablet  Take 2 tablets (8 mg total) by mouth 2 (two) times daily. Take two tablets twice  daily x 3 days starting on the day before each dose of Taxotere chemotherapy  36 tablet  0   .  gabapentin (NEURONTIN) 100 MG capsule  Take 2 capsules (200 mg total) by mouth 3 (three) times daily.  180 capsule  3   .  HYDROcodone-acetaminophen (NORCO/VICODIN) 5-325 MG per tablet  Take 1 tablet by mouth every 4 (four) hours as needed for moderate pain.     .  lidocaine-prilocaine (EMLA) cream  Apply to affected area once  30 g  3   .  lisinopril (PRINIVIL,ZESTRIL) 10 MG tablet  Take 1 tablet (10 mg total) by mouth daily.  14 tablet  0   .  potassium chloride SA (K-DUR,KLOR-CON) 20 MEQ tablet  Take 1 tablet (20 mEq total) by mouth daily.  8 tablet  0   .  simvastatin (ZOCOR) 40 MG tablet  Take 1 tablet (40 mg total) by mouth daily at 6 PM.  14 tablet  0    No current   MD 500 mL/hr at 04/04/15 1444    . sodium chloride 0.9 % injection 10 mL  10 mL Intravenous PRN [name], MD   10 mL at 04/04/15 1440    Review of Systems Review of Systems  All other systems reviewed and are negative.   Blood pressure 124/70, pulse 74, resp. rate 14, height _0  (1.676 m), weight 158 lb (71.668 kg).  Physical Exam Physical Exam  Constitutional: She is oriented to person, place, and time. She appears well-developed and well-nourished.  HENT:  Head: Normocephalic and atraumatic.  Eyes: Conjunctivae are normal. No scleral icterus.  Neck: Neck supple. No tracheal deviation present.  Cardiovascular: Normal rate, regular rhythm and normal heart sounds.    Pulmonary/Chest: Effort normal and breath sounds normal. Right breast exhibits no inverted nipple, no mass, no nipple discharge, no skin change and no tenderness. Left breast exhibits no inverted nipple, no nipple discharge, no skin change and no tenderness.    Left breast dimpling at 1 o'clock 6 mm from nipple.  Musculoskeletal: Normal range of motion.  Lymphadenopathy:    She has no cervical adenopathy.  Neurological: She is alert and oriented to person, place, and time.  Skin: Skin is warm and dry.  Psychiatric: She has a normal mood and affect. Her behavior is normal. Judgment and thought content normal.    Data Reviewed I spoke with Dr. Ma Hillock by phone. In spite of the skin involvement she was not felt to be suffering from inflammatory breast cancer. Biopsy did show lymphovascular invasion in the tumor mass, and axillary nodes were positive. A skin biopsy was not completed as part of her evaluation.  CBC incompetence of metabolic panel from June 8676 showed mild anemia, minimal elevation of the white blood cell count (likely secondary to Neulasta therapy) and normal renal function.  12/10/2014 initial imaging studies of the breast including ultrasound showed a 4.3 cm mass in the upper-outer quadrant with extension to the skin. Extensive calcifications were noted in this area. An enlarged lymph node measuring up to 2.8 cm was noted. Core biopsy was completed of both lesion showing evidence of poorly differentiated carcinoma consistent with breast source in the axillary lymph node as well as a Nottingham grade 2 carcinoma the breast. Post biopsy imaging shows a clip at the lateral aspect of the tumor mass as well as in the axillary lymph node.  Breast ultrasound was completed to assess response to neo-adjuvant chemotherapy and help determine the possibility for breast conservation. The breast continues to show involvement of the skin with retraction at the 1:00 position. Ultrasound this area  shows a 2.1 x 3.2 x 3.4 cm poorly defined mass with acoustic shadowing and irregular borders and intralesional calcifications consistent with residual malignancy. Examine of the axillary area shows 2 prominent lymph nodes now with a maximum diameter of 1.12 cm. A clip appears present within the lower axillary lymph node.    Assessment    Stage II carcinoma the left breast status post needle adjuvant chemotherapy.    Plan    We had a long discussion regarding options for management of the breast. The patient is very committed to breast conservation. She wears a "B" cup bra, which gives minimal volume to work with. The importance of getting clear surgical margins, and anticipated changes with post wide excision radiation were reviewed. The patient reports that she is willing to accept deformity of the breast, as long she has the breast.  The patient is not willing at  this time to consider mastectomy if clear margins cannot be achieved. The potential for plastic surgery involvement if such a quantity of tissue is required for removal to preclude reasonable cosmesis was discussed. At this time she is not interested in mastectomy with immediate reconstruction.  The patient wasn't performed at it's likely going to be 2-3 weeks before she can return to work. It was at this point that she related that she had missed less than a week of work during her entire needle adjuvant chemotherapy experience.  Surgery will be postponed for a minimum of 3 weeks after her last chemotherapy to allow her recover. The potential for axillary dissection if persistent disease is identified and the lymph nodes was reviewed.     Discussed risk and benefits of left partial mastectomy with SN biopsy.  Patient's surgery has been scheduled for 05-23-15 at [facility]. This patient has been asked to decrease current 325 mg aspirin to 81 mg aspirin starting one week prior to procedure.  Over 50% of the visit was spent reviewing  options for surgical management.   PCP:  Keith Rake  Dr. Charletta Cousin, [place] Gleason 05/02/2015, 6:24 AM

## 2015-05-01 NOTE — Patient Instructions (Addendum)
The patient is aware to call back for any questions or concerns.  Patient's surgery has been scheduled for 05-23-15 at [facility]. This patient has been asked to decrease current 325 mg aspirin to 81 mg aspirin starting one week prior to procedure.

## 2015-05-02 ENCOUNTER — Encounter: Payer: Self-pay | Admitting: General Surgery

## 2015-05-02 NOTE — H&P (Signed)
Patient ID: Sarah Horne, female DOB: May 03, 1956, 59 y.o. MRN: [id number]  Chief Complaint   Patient presents with   .  Other     discuss surgical options    HPI  Sarah Horne is a 59 y.o. female here today to discuss surgical options. Patient last chemo was on 04/23/15.  The patient was last seen in March 2016 at which time a PowerPort was placed for administration of neo-adjuvant chemotherapy. The patient has tolerated the therapy well with modest peripheral neuropathy. She has continued to work full-time, reports that she is missed only 6 days of work due to dehydration or neutropenia. 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  HPI  Past Medical History   Diagnosis  Date   .  Hypertension    .  Hyperlipidemia    .  Heart attack  1998   .  Cancer  2016     Stage II, ER positive, PR positive, HER-2/neu overexpressing of the left breast.    Past Surgical History   Procedure  Laterality  Date   .  Portacath placement  Right  12-31-14     Dr Bary Castilla   .  Breast surgery  Left  12/18/14     breast biopsy/INVASIVE DUCTAL CARCINOMA OF BREAST, NOTTINGHAM GRADE 2.    Family History   Problem  Relation  Age of Onset   .  Breast cancer  Maternal Aunt    .  Breast cancer  Cousin    .  Brain cancer  Maternal Uncle     Social History  History   Substance Use Topics   .  Smoking status:  Current Every Day Smoker -- 0.50 packs/day for 18 years     Types:  Cigarettes   .  Smokeless tobacco:  Not on file   .  Alcohol Use:  No    Allergies   Allergen  Reactions   .  No Known Allergies     Current Outpatient Prescriptions   Medication  Sig  Dispense  Refill   .  aspirin 325 MG tablet  Take 325 mg by mouth daily.     Marland Kitchen  atenolol (TENORMIN) 25 MG tablet  Take 1 tablet (25 mg total) by mouth daily.  14 tablet  0   .  dexamethasone (DECADRON) 4 MG tablet  Take 2 tablets (8 mg total) by mouth 2 (two) times daily. Take two tablets twice  daily x 3 days starting on the day before each dose of Taxotere chemotherapy  36 tablet  0   .  gabapentin (NEURONTIN) 100 MG capsule  Take 2 capsules (200 mg total) by mouth 3 (three) times daily.  180 capsule  3   .  HYDROcodone-acetaminophen (NORCO/VICODIN) 5-325 MG per tablet  Take 1 tablet by mouth every 4 (four) hours as needed for moderate pain.     Marland Kitchen  lidocaine-prilocaine (EMLA) cream  Apply to affected area once  30 g  3   .  lisinopril (PRINIVIL,ZESTRIL) 10 MG tablet  Take 1 tablet (10 mg total) by mouth daily.  14 tablet  0   .  potassium chloride SA (K-DUR,KLOR-CON) 20 MEQ tablet  Take 1 tablet (20 mEq total) by mouth daily.  8 tablet  0   .  simvastatin (ZOCOR) 40 MG tablet  Take 1 tablet (40 mg total) by mouth daily at 6 PM.  14 tablet  0    No current  facility-administered medications for this visit.    Facility-Administered Medications Ordered in Other Visits   Medication  Dose  Route  Frequency  Provider  Last Rate  Last Dose   .  0.9 % sodium chloride infusion   Intravenous  Continuous  [name], MD     .  0.9 % sodium chloride infusion   Intravenous  Continuous  Lloyd Huger, MD  999 mL/hr at 04/24/15 1520    .  famotidine (PEPCID) IVPB 20 mg  20 mg  Intravenous  Q12H  [name], MD   20 mg at 01/29/15 1230   .  sodium chloride 0.45 % 1,000 mL infusion   Intravenous  Continuous  [name], MD  500 mL/hr at 04/04/15 1444    .  sodium chloride 0.9 % injection 10 mL  10 mL  Intravenous  PRN  [name], MD   10 mL at 04/04/15 1440    Review of Systems  Review of Systems  All other systems reviewed and are negative.   Blood pressure 124/70, pulse 74, resp. rate 14, height _0  (1.676 m), weight 158 lb (71.668 kg).  Physical Exam  Physical Exam  Constitutional: She is oriented to person, place, and time. She appears well-developed and well-nourished.  HENT:  Head: Normocephalic and atraumatic.  Eyes: Conjunctivae are normal. No scleral icterus.    Neck: Neck supple. No tracheal deviation present.  Cardiovascular: Normal rate, regular rhythm and normal heart sounds.  Pulmonary/Chest: Effort normal and breath sounds normal. Right breast exhibits no inverted nipple, no mass, no nipple discharge, no skin change and no tenderness. Left breast exhibits no inverted nipple, no nipple discharge, no skin change and no tenderness.    Left breast dimpling at 1 o'clock 6 mm from nipple.  Musculoskeletal: Normal range of motion.  Lymphadenopathy:  She has no cervical adenopathy.  Neurological: She is alert and oriented to person, place, and time.  Skin: Skin is warm and dry.  Psychiatric: She has a normal mood and affect. Her behavior is normal. Judgment and thought content normal.   Data Reviewed  I spoke with Dr. Ma Hillock by phone. In spite of the skin involvement she was not felt to be suffering from inflammatory breast cancer. Biopsy did show lymphovascular invasion in the tumor mass, and axillary nodes were positive. A skin biopsy was not completed as part of her evaluation.  CBC incompetence of metabolic panel from June 1937 showed mild anemia, minimal elevation of the white blood cell count (likely secondary to Neulasta therapy) and normal renal function.  12/10/2014 initial imaging studies of the breast including ultrasound showed a 4.3 cm mass in the upper-outer quadrant with extension to the skin. Extensive calcifications were noted in this area. An enlarged lymph node measuring up to 2.8 cm was noted. Core biopsy was completed of both lesion showing evidence of poorly differentiated carcinoma consistent with breast source in the axillary lymph node as well as a Nottingham grade 2 carcinoma the breast. Post biopsy imaging shows a clip at the lateral aspect of the tumor mass as well as in the axillary lymph node.  Breast ultrasound was completed to assess response to neo-adjuvant chemotherapy and help determine the possibility for breast  conservation. The breast continues to show involvement of the skin with retraction at the 1:00 position. Ultrasound this area shows a 2.1 x 3.2 x 3.4 cm poorly defined mass with acoustic shadowing and irregular borders and intralesional calcifications consistent with residual malignancy. Examine  of the axillary area shows 2 prominent lymph nodes now with a maximum diameter of 1.12 cm. A clip appears present within the lower axillary lymph node.  Assessment   Stage II carcinoma the left breast status post needle adjuvant chemotherapy.   Plan   We had a long discussion regarding options for management of the breast. The patient is very committed to breast conservation. She wears a "B" cup bra, which gives minimal volume to work with. The importance of getting clear surgical margins, and anticipated changes with post wide excision radiation were reviewed. The patient reports that she is willing to accept deformity of the breast, as long she has the breast.  The patient is not willing at this time to consider mastectomy if clear margins cannot be achieved. The potential for plastic surgery involvement if such a quantity of tissue is required for removal to preclude reasonable cosmesis was discussed. At this time she is not interested in mastectomy with immediate reconstruction.  The patient wasn't performed at it's likely going to be 2-3 weeks before she can return to work. It was at this point that she related that she had missed less than a week of work during her entire needle adjuvant chemotherapy experience.  Surgery will be postponed for a minimum of 3 weeks after her last chemotherapy to allow her recover. The potential for axillary dissection if persistent disease is identified and the lymph nodes was reviewed.   Discussed risk and benefits of left partial mastectomy with SN biopsy.  Patient's surgery has been scheduled for 05-23-15 at [facility]. This patient has been asked to decrease current 325 mg  aspirin to 81 mg aspirin starting one week prior to procedure.  Over 50% of the visit was spent reviewing options for surgical management.  PCP: Keith Rake  Dr. Charletta Cousin, [place] Gleason  05/02/2015, 6:24 AM

## 2015-05-06 ENCOUNTER — Other Ambulatory Visit: Payer: Self-pay | Admitting: *Deleted

## 2015-05-06 DIAGNOSIS — C50912 Malignant neoplasm of unspecified site of left female breast: Secondary | ICD-10-CM

## 2015-05-08 ENCOUNTER — Encounter: Payer: Self-pay | Admitting: General Surgery

## 2015-05-14 ENCOUNTER — Inpatient Hospital Stay: Payer: BLUE CROSS/BLUE SHIELD

## 2015-05-14 ENCOUNTER — Inpatient Hospital Stay: Payer: BLUE CROSS/BLUE SHIELD | Admitting: Internal Medicine

## 2015-05-17 ENCOUNTER — Inpatient Hospital Stay (HOSPITAL_BASED_OUTPATIENT_CLINIC_OR_DEPARTMENT_OTHER): Payer: BLUE CROSS/BLUE SHIELD | Admitting: Internal Medicine

## 2015-05-17 ENCOUNTER — Inpatient Hospital Stay: Payer: BLUE CROSS/BLUE SHIELD

## 2015-05-17 ENCOUNTER — Ambulatory Visit
Admission: RE | Admit: 2015-05-17 | Discharge: 2015-05-17 | Disposition: A | Discharge status: Home / Self Care | Payer: BLUE CROSS/BLUE SHIELD | Source: Ambulatory Visit | Attending: Internal Medicine | Admitting: Internal Medicine

## 2015-05-17 ENCOUNTER — Inpatient Hospital Stay: Payer: BLUE CROSS/BLUE SHIELD | Attending: Internal Medicine

## 2015-05-17 VITALS — BP 102/68 | HR 102 | Temp 96.9°F | Resp 16 | Wt 160.5 lb

## 2015-05-17 DIAGNOSIS — C773 Secondary and unspecified malignant neoplasm of axilla and upper limb lymph nodes: Secondary | ICD-10-CM | POA: Diagnosis not present

## 2015-05-17 DIAGNOSIS — I252 Old myocardial infarction: Secondary | ICD-10-CM | POA: Diagnosis not present

## 2015-05-17 DIAGNOSIS — R600 Localized edema: Secondary | ICD-10-CM | POA: Diagnosis not present

## 2015-05-17 DIAGNOSIS — Z803 Family history of malignant neoplasm of breast: Secondary | ICD-10-CM | POA: Diagnosis not present

## 2015-05-17 DIAGNOSIS — G620 Drug-induced polyneuropathy: Secondary | ICD-10-CM | POA: Diagnosis not present

## 2015-05-17 DIAGNOSIS — Z7982 Long term (current) use of aspirin: Secondary | ICD-10-CM | POA: Insufficient documentation

## 2015-05-17 DIAGNOSIS — T451X5S Adverse effect of antineoplastic and immunosuppressive drugs, sequela: Secondary | ICD-10-CM | POA: Insufficient documentation

## 2015-05-17 DIAGNOSIS — E785 Hyperlipidemia, unspecified: Secondary | ICD-10-CM | POA: Diagnosis not present

## 2015-05-17 DIAGNOSIS — I10 Essential (primary) hypertension: Secondary | ICD-10-CM | POA: Insufficient documentation

## 2015-05-17 DIAGNOSIS — M7989 Other specified soft tissue disorders: Secondary | ICD-10-CM | POA: Insufficient documentation

## 2015-05-17 DIAGNOSIS — C50412 Malignant neoplasm of upper-outer quadrant of left female breast: Secondary | ICD-10-CM | POA: Insufficient documentation

## 2015-05-17 DIAGNOSIS — Z170 Estrogen receptor positive status [ER+]: Secondary | ICD-10-CM | POA: Insufficient documentation

## 2015-05-17 DIAGNOSIS — Z79899 Other long term (current) drug therapy: Secondary | ICD-10-CM | POA: Diagnosis not present

## 2015-05-17 LAB — CBC WITH DIFFERENTIAL/PLATELET
BASOS PCT: 1 %
Basophils Absolute: 0 10*3/uL (ref 0–0.1)
Eosinophils Absolute: 0 10*3/uL (ref 0–0.7)
Eosinophils Relative: 1 %
HCT: 34.2 % — ABNORMAL LOW (ref 35.0–47.0)
HEMOGLOBIN: 11.3 g/dL — AB (ref 12.0–16.0)
Lymphocytes Relative: 35 %
Lymphs Abs: 1.2 10*3/uL (ref 1.0–3.6)
MCH: 32.3 pg (ref 26.0–34.0)
MCHC: 33.2 g/dL (ref 32.0–36.0)
MCV: 97.2 fL (ref 80.0–100.0)
Monocytes Absolute: 0.3 10*3/uL (ref 0.2–0.9)
Monocytes Relative: 9 %
NEUTROS ABS: 2 10*3/uL (ref 1.4–6.5)
NEUTROS PCT: 54 %
Platelets: 265 10*3/uL (ref 150–440)
RBC: 3.51 MIL/uL — AB (ref 3.80–5.20)
RDW: 16.7 % — ABNORMAL HIGH (ref 11.5–14.5)
WBC: 3.6 10*3/uL (ref 3.6–11.0)

## 2015-05-17 LAB — HEPATIC FUNCTION PANEL
ALBUMIN: 3.6 g/dL (ref 3.5–5.0)
ALT: 20 U/L (ref 14–54)
AST: 29 U/L (ref 15–41)
Alkaline Phosphatase: 52 U/L (ref 38–126)
Bilirubin, Direct: <0.1 mg/dL — ABNORMAL LOW (ref 0.1–0.5)
TOTAL PROTEIN: 6 g/dL — AB (ref 6.5–8.1)
Total Bilirubin: 0.6 mg/dL (ref 0.3–1.2)

## 2015-05-17 LAB — CREATININE, SERUM: Creatinine, Ser: 0.79 mg/dL (ref 0.44–1.00)

## 2015-05-17 MED ORDER — SODIUM CHLORIDE 0.9 % IJ SOLN
10.0000 mL | Freq: Once | INTRAMUSCULAR | Status: AC
  Administered 2015-05-17: 10 mL via INTRAVENOUS
  Filled 2015-05-17: qty 10

## 2015-05-17 MED ORDER — HEPARIN SOD (PORK) LOCK FLUSH 100 UNIT/ML IV SOLN
INTRAVENOUS | Status: AC
  Filled 2015-05-17: qty 5

## 2015-05-17 MED ORDER — HEPARIN SOD (PORK) LOCK FLUSH 100 UNIT/ML IV SOLN
500.0000 [IU] | Freq: Once | INTRAVENOUS | Status: AC
  Administered 2015-05-17: 500 [IU] via INTRAVENOUS

## 2015-05-17 MED ORDER — FUROSEMIDE 20 MG PO TABS: ORAL_TABLET | ORAL | Status: DC

## 2015-05-17 NOTE — Addendum Note (Signed)
Addended by: [name] on: 05/17/2015 04:57 PM   Modules accepted: Orders

## 2015-05-17 NOTE — Progress Notes (Signed)
[place]  Telephone:(336) [phone] Fax:(336) [phone]     ID: VERNECIA UMBLE OB: 08/03/1956  MR#: [id number]  SPQ#:[id number]  Patient Care Team: [name], MD as PCP - General (Family Medicine) [name], MD (General Surgery) [name], MD as Attending Physician (Internal Medicine)  CHIEF COMPLAINT/DIAGNOSIS:   Invasive ductal carcinoma of the left breast grade 2 on biopsy  (clinically T2N1Mx, stage II disease). Tumor size 4.3 x 4.0 x 3.4 cm. Left axillary lymph node measuring 2.7 cm. Left axillary lymph node biopsy positive for involvement by poorly differentiated carcinoma consistent with breast primary, left breast mass biopsy showed invasive ductal carcinoma grade 2, biopsy done on 12/18/2014. ER positive greater than 90%, PR positive 51% to 90%, HER2/neu positive (3+ on IHC). PET scan 12/31/14 - IMPRESSION: 1. Hypermetabolic left breast mass consists with primary carcinoma. 2. Evidence of local nodal metastasis to the left axilla. 3. No evidence of central nodal metastasis to mediastinum. 4. Two lesions in liver with mild metabolic activity. Differential would include focal nodular hyperplasia versus metastatic breast cancer. Recommend either a contrast-enhanced liver MRI or for added specificity ultrasound-guided hepatic biopsy. CT-guided liver biopsy 01/04/15 - negative for malignancy.  Patient got Neoadjuvant chemotherapy with Taxotere/Carboplatin/Herceptin/Perjeta (got 6 cycles from 01/08/15 - 04/23/15).  HISTORY OF PRESENT ILLNESS:  Patient returns for scheduled oncology evaluation, she recently completed cycle 6 chemotherapy about 3 weeks ago. States that she still has tingling and numbness in the hands and feet but denies difficulty in using of hands. Denies any imbalance or falls. She continues to work and states that she forgets to take afternoon dose of gabapentin. Otherwise feels that left breast mass is significantly smaller in size. Also  complains of bilateral upper extremity swelling but no pain. She has noticed mild swelling around ankles. Denies nausea or vomiting. No diarrhea. Appetite is good.   REVIEW OF SYSTEMS:   ROS As in HPI above. In addition, no fever or chills. Denies new headaches, dizziness or epistaxis. No cough, shortness of breath. No wheezing or hemoptysis. No retrosternal chest pain. No orthopnea, PND. abdominal pain, nausea or vomiting. No dysuria or hematuria. No new bone pain. No polyuria or polydipsia. PS ECOG 1.  PAST MEDICAL HISTORY: Past Medical History  Diagnosis Date  . Hypertension   . Hyperlipidemia   . Heart attack 1998  . Cancer 2016    Stage II, ER positive, PR positive, HER-2/neu overexpressing of the left breast.    PAST SURGICAL HISTORY: Past Surgical History  Procedure Laterality Date  . Portacath placement Right 12-31-14    Dr Bary Castilla  . Breast surgery Left 12/18/14    breast biopsy/INVASIVE DUCTAL CARCINOMA OF BREAST, NOTTINGHAM GRADE 2.    FAMILY HISTORY Family History  Problem Relation Age of Onset  . Breast cancer Maternal Aunt   . Breast cancer Cousin   . Brain cancer Maternal Uncle    SOCIAL HISTORY: Social History  Substance Use Topics  . Smoking status: Current Every Day Smoker -- 0.50 packs/day for 18 years    Types: Cigarettes  . Smokeless tobacco: Not on file  . Alcohol Use: No     Allergies  Allergen Reactions  . No Known Allergies     Current Outpatient Prescriptions  Medication Sig Dispense Refill  . aspirin 325 MG tablet Take 325 mg by mouth daily.    Marland Kitchen atenolol (TENORMIN) 25 MG tablet Take 1 tablet (25 mg total) by mouth daily. 14 tablet 0  .  dexamethasone (DECADRON) 4 MG tablet Take 2 tablets (8 mg total) by mouth 2 (two) times daily. Take two tablets twice daily x 3 days starting on the day before each dose of Taxotere chemotherapy 36 tablet 0  . gabapentin (NEURONTIN) 100 MG capsule Take 2 capsules (200 mg total) by mouth 3 (three) times daily.  (Patient taking differently: Take 200 mg by mouth 3 (three) times daily. ) 180 capsule 3  . HYDROcodone-acetaminophen (NORCO/VICODIN) 5-325 MG per tablet Take 1 tablet by mouth every 4 (four) hours as needed for moderate pain.    Marland Kitchen lidocaine-prilocaine (EMLA) cream Apply to affected area once 30 g 3  . lisinopril (PRINIVIL,ZESTRIL) 10 MG tablet Take 1 tablet (10 mg total) by mouth daily. 14 tablet 0  . potassium chloride SA (K-DUR,KLOR-CON) 20 MEQ tablet Take 1 tablet (20 mEq total) by mouth daily. 8 tablet 0  . simvastatin (ZOCOR) 40 MG tablet Take 1 tablet (40 mg total) by mouth daily at 6 PM. 14 tablet 0   No current facility-administered medications for this visit.   Facility-Administered Medications Ordered in Other Visits  Medication Dose Route Frequency Provider Last Rate Last Dose  . 0.9 %  sodium chloride infusion   Intravenous Continuous [name], MD      . 0.9 %  sodium chloride infusion   Intravenous Continuous Lloyd Huger, MD 999 mL/hr at 04/24/15 1520    . famotidine (PEPCID) IVPB 20 mg  20 mg Intravenous Q12H [name], MD   20 mg at 01/29/15 1230  . sodium chloride 0.45 % 1,000 mL infusion   Intravenous Continuous [name], MD 500 mL/hr at 04/04/15 1444    . sodium chloride 0.9 % injection 10 mL  10 mL Intravenous PRN [name], MD   10 mL at 04/04/15 1440    OBJECTIVE: Filed Vitals:   05/17/15 1352  BP: 102/68  Pulse: 102  Temp: 96.9 F (36.1 C)  Resp: 16     Body mass index is 25.92 kg/(m^2).    ECOG FS:1 - Symptomatic but completely ambulatory  GENERAL: Patient is alert and oriented and in no acute distress. There is no icterus. HEENT: Extraocular movements intact. No cervical lymphadenopathy. CVS: S1, S2 heard with regular rate and rhythm.   LUNGS: Bilaterally clear to auscultation. No crepitations. ABDOMEN: Soft, nontender. No hepatosplenomegaly. EXTREMITIES: Bilateral upper extremity edema. Only has trace ankle edema. BREASTS: left  breast mass is significantly decreased in size, left axillary lymph node is not palpable today. Exam performed in presence of a nurse.  LAB RESULTS: WBC 3.6, hemoglobin 11.3, platelets 265, ANC 2.0     Lab Results  Component Value Date   LABCA2 33.8 04/16/2015    ASSESSMENT/PLAN:   1. Invasive ductal carcinoma of the left breast grade 2 on biopsy  (clinically T2N1Mx, stage II disease). Tumor size 4.3 x 4.0 x 3.4 cm. Left axillary lymph node measuring 2.7 cm. Left axillary lymph node biopsy positive for involvement by poorly differentiated carcinoma consistent with breast primary, left breast mass biopsy showed invasive ductal carcinoma grade 2, biopsy done on 12/18/2014. ER positive greater than 90%, PR positive 51% to 90%, HER2/neu positive (3+ on IHC). CT-guided liver biopsy 01/04/15 negative for malignancy. Patient started on Neoadjuvant chemotherapy with Taxotere/Carboplatin/Herceptin/Perjeta today (01/08/15) -  Reviewed labs from today and d/w patient. Clinically left breast mass and left axillary node is much smaller. Patient has completed planned 6 cycles of neo-adjuvant chemotherapy. She is scheduled for surgery on August 25.  We will see her back in about 3 weeks and make further plan of management. Have again explained that if she has breast conservation surgery she will then need to receive radiation therapy, to be followed by adjuvant homornal therapy given ER/PR positive disease. She also will complete the remaining Herceptin course after surgery (for a total of 52 weeks).  2. Peripheral Neuropathy  - change gabapentin dose to 400 mg twice a day and monitor.   3. Bilateral upper extremity edema - we will get venous Doppler today, if negative for DVT, will add furosemide + potassium supplement prn. 4. In between visits, patient advised to call or come to the ER in case of fevers, bleeding, acute sickness or new symptoms. She is agreeable to above plan.  [name], MD   05/17/2015 2:29  PM

## 2015-05-20 ENCOUNTER — Other Ambulatory Visit
Admission: RE | Admit: 2015-05-20 | Discharge: 2015-05-20 | Disposition: A | Discharge status: Home / Self Care | Payer: BLUE CROSS/BLUE SHIELD | Source: Ambulatory Visit | Attending: Anesthesiology | Admitting: Anesthesiology

## 2015-05-20 ENCOUNTER — Inpatient Hospital Stay: Admission: RE | Admit: 2015-05-20 | Payer: BLUE CROSS/BLUE SHIELD | Source: Ambulatory Visit

## 2015-05-20 ENCOUNTER — Encounter: Payer: Self-pay | Admitting: *Deleted

## 2015-05-20 DIAGNOSIS — Z01812 Encounter for preprocedural laboratory examination: Secondary | ICD-10-CM | POA: Diagnosis present

## 2015-05-20 LAB — POTASSIUM: Potassium: 4.1 mmol/L (ref 3.5–5.1)

## 2015-05-20 NOTE — Patient Instructions (Signed)
  Your procedure is scheduled on: 05-23-15 Report to [facility]  (2ND DESK ON RIGHT)   Remember: Instructions that are not followed completely may result in serious medical risk, up to and including death, or upon the discretion of your surgeon and anesthesiologist your surgery may need to be rescheduled.    _X___ 1. Do not eat food or drink liquids after midnight. No gum chewing or hard candies.     _X___ 2. No Alcohol for 24 hours before or after surgery.   ____ 3. Bring all medications with you on the day of surgery if instructed.    ____ 4. Notify your doctor if there is any change in your medical condition     (cold, fever, infections).     Do not wear jewelry, make-up, hairpins, clips or nail polish.  Do not wear lotions, powders, or perfumes. You may wear deodorant.  Do not shave 48 hours prior to surgery. Men may shave face and neck.  Do not bring valuables to the hospital.    [facility] is not responsible for any belongings or valuables.               Contacts, dentures or bridgework may not be worn into surgery.  Leave your suitcase in the car. After surgery it may be brought to your room.  For patients admitted to the hospital, discharge time is determined by your treatment team.   Patients discharged the day of surgery will not be allowed to drive home.   Please read over the following fact sheets that you were given:      _X___ Take these medicines the morning of surgery with A SIP OF WATER:    1. LISINOPRIL  2. ATENOLOL  3. GABAPENTIN  4.  5.  6.  ____ Fleet Enema (as directed)   ____ Use CHG Soap as directed  ____ Use inhalers on the day of surgery  ____ Stop metformin 2 days prior to surgery    ____ Take 1/2 of usual insulin dose the night before surgery and none on the morning of surgery.   ____ Stop Coumadin/Plavix/aspirin-OK TO CONTINUE ASA 81 MG PER DR BYRNETT-DO NOT TAKE THE AM OF SURGERY  ____ Stop Anti-inflammatories-NO  NSAIDS OR ASA PRODUCTS-TYLENOL OK   ____ Stop supplements until after surgery.    ____ Bring C-Pap to the hospital.

## 2015-05-23 ENCOUNTER — Ambulatory Visit
Admission: RE | Admit: 2015-05-23 | Discharge: 2015-05-23 | Disposition: A | Discharge status: Home / Self Care | Payer: BLUE CROSS/BLUE SHIELD | Source: Ambulatory Visit | Attending: General Surgery | Admitting: General Surgery

## 2015-05-23 ENCOUNTER — Encounter: Payer: Self-pay | Admitting: *Deleted

## 2015-05-23 ENCOUNTER — Ambulatory Visit: Payer: BLUE CROSS/BLUE SHIELD | Admitting: Anesthesiology

## 2015-05-23 ENCOUNTER — Ambulatory Visit: Payer: BLUE CROSS/BLUE SHIELD

## 2015-05-23 ENCOUNTER — Encounter
Admission: RE | Disposition: A | Discharge status: Home / Self Care | Payer: Self-pay | Source: Ambulatory Visit | Attending: General Surgery

## 2015-05-23 DIAGNOSIS — Z808 Family history of malignant neoplasm of other organs or systems: Secondary | ICD-10-CM | POA: Diagnosis not present

## 2015-05-23 DIAGNOSIS — D0512 Intraductal carcinoma in situ of left breast: Secondary | ICD-10-CM | POA: Diagnosis not present

## 2015-05-23 DIAGNOSIS — F17210 Nicotine dependence, cigarettes, uncomplicated: Secondary | ICD-10-CM | POA: Diagnosis not present

## 2015-05-23 DIAGNOSIS — C50912 Malignant neoplasm of unspecified site of left female breast: Secondary | ICD-10-CM

## 2015-05-23 DIAGNOSIS — G629 Polyneuropathy, unspecified: Secondary | ICD-10-CM | POA: Diagnosis not present

## 2015-05-23 DIAGNOSIS — E785 Hyperlipidemia, unspecified: Secondary | ICD-10-CM | POA: Diagnosis not present

## 2015-05-23 DIAGNOSIS — Z803 Family history of malignant neoplasm of breast: Secondary | ICD-10-CM | POA: Diagnosis not present

## 2015-05-23 DIAGNOSIS — Z7982 Long term (current) use of aspirin: Secondary | ICD-10-CM | POA: Insufficient documentation

## 2015-05-23 DIAGNOSIS — Z9221 Personal history of antineoplastic chemotherapy: Secondary | ICD-10-CM | POA: Insufficient documentation

## 2015-05-23 DIAGNOSIS — Z170 Estrogen receptor positive status [ER+]: Secondary | ICD-10-CM | POA: Insufficient documentation

## 2015-05-23 DIAGNOSIS — C50412 Malignant neoplasm of upper-outer quadrant of left female breast: Secondary | ICD-10-CM | POA: Diagnosis not present

## 2015-05-23 DIAGNOSIS — I252 Old myocardial infarction: Secondary | ICD-10-CM | POA: Insufficient documentation

## 2015-05-23 DIAGNOSIS — I10 Essential (primary) hypertension: Secondary | ICD-10-CM | POA: Diagnosis not present

## 2015-05-23 DIAGNOSIS — N63 Unspecified lump in unspecified breast: Secondary | ICD-10-CM

## 2015-05-23 HISTORY — DX: Polyneuropathy, unspecified: G62.9

## 2015-05-23 HISTORY — PX: BREAST LUMPECTOMY WITH SENTINEL LYMPH NODE BIOPSY: SHX5597

## 2015-05-23 SURGERY — BREAST LUMPECTOMY WITH SENTINEL LYMPH NODE BX
Anesthesia: General | Laterality: Left | Wound class: Clean

## 2015-05-23 MED ORDER — TECHNETIUM TC 99M SULFUR COLLOID
1.0650 | Freq: Once | INTRAVENOUS | Status: DC | PRN
  Administered 2015-05-23: 1.065 via INTRAVENOUS
  Filled 2015-05-23: qty 1.07

## 2015-05-23 MED ORDER — ACETAMINOPHEN 10 MG/ML IV SOLN
INTRAVENOUS | Status: AC
  Filled 2015-05-23: qty 100

## 2015-05-23 MED ORDER — EPHEDRINE SULFATE 50 MG/ML IJ SOLN
INTRAMUSCULAR | Status: DC | PRN
  Administered 2015-05-23 (×4): 10 mg via INTRAVENOUS

## 2015-05-23 MED ORDER — LACTATED RINGERS IV SOLN
INTRAVENOUS | Status: DC
  Administered 2015-05-23: 09:00:00 via INTRAVENOUS

## 2015-05-23 MED ORDER — ACETAMINOPHEN 10 MG/ML IV SOLN
INTRAVENOUS | Status: DC | PRN
  Administered 2015-05-23: 1000 mg via INTRAVENOUS

## 2015-05-23 MED ORDER — DEXAMETHASONE SODIUM PHOSPHATE 10 MG/ML IJ SOLN
INTRAMUSCULAR | Status: DC | PRN
  Administered 2015-05-23: 10 mg via INTRAVENOUS

## 2015-05-23 MED ORDER — FENTANYL CITRATE (PF) 100 MCG/2ML IJ SOLN
25.0000 ug | INTRAMUSCULAR | Status: DC | PRN
  Administered 2015-05-23 (×4): 25 ug via INTRAVENOUS

## 2015-05-23 MED ORDER — FENTANYL CITRATE (PF) 100 MCG/2ML IJ SOLN
INTRAMUSCULAR | Status: AC
  Administered 2015-05-23: 25 ug via INTRAVENOUS
  Filled 2015-05-23: qty 2

## 2015-05-23 MED ORDER — KETOROLAC TROMETHAMINE 30 MG/ML IJ SOLN
INTRAMUSCULAR | Status: DC | PRN
  Administered 2015-05-23: 30 mg via INTRAVENOUS

## 2015-05-23 MED ORDER — FAMOTIDINE 20 MG PO TABS
20.0000 mg | ORAL_TABLET | Freq: Once | ORAL | Status: AC
  Administered 2015-05-23: 20 mg via ORAL

## 2015-05-23 MED ORDER — BUPIVACAINE-EPINEPHRINE (PF) 0.5% -1:200000 IJ SOLN
INTRAMUSCULAR | Status: AC
  Filled 2015-05-23: qty 30

## 2015-05-23 MED ORDER — METHYLENE BLUE 1 % INJ SOLN
INTRAMUSCULAR | Status: DC | PRN
  Administered 2015-05-23: 1 mL

## 2015-05-23 MED ORDER — BUPIVACAINE-EPINEPHRINE (PF) 0.5% -1:200000 IJ SOLN
INTRAMUSCULAR | Status: DC | PRN
  Administered 2015-05-23: 30 mL

## 2015-05-23 MED ORDER — ONDANSETRON HCL 4 MG/2ML IJ SOLN
INTRAMUSCULAR | Status: DC | PRN
  Administered 2015-05-23: 4 mg via INTRAVENOUS

## 2015-05-23 MED ORDER — CEFAZOLIN SODIUM-DEXTROSE 2-3 GM-% IV SOLR
2.0000 g | INTRAVENOUS | Status: AC
Start: 2015-05-23 — End: 2015-05-23
  Administered 2015-05-23: 2 g via INTRAVENOUS

## 2015-05-23 MED ORDER — MIDAZOLAM HCL 5 MG/5ML IJ SOLN
INTRAMUSCULAR | Status: DC | PRN
  Administered 2015-05-23: 2 mg via INTRAVENOUS

## 2015-05-23 MED ORDER — METHYLENE BLUE 1 % INJ SOLN
INTRAMUSCULAR | Status: AC
Start: 2015-05-23 — End: 2015-05-23
  Filled 2015-05-23: qty 10

## 2015-05-23 MED ORDER — PROMETHAZINE HCL 25 MG/ML IJ SOLN: 6.2500 mg | INTRAMUSCULAR | Status: DC | PRN

## 2015-05-23 MED ORDER — SODIUM CHLORIDE 0.9 % IJ SOLN
INTRAMUSCULAR | Status: AC
  Filled 2015-05-23: qty 10

## 2015-05-23 MED ORDER — FAMOTIDINE 20 MG PO TABS
ORAL_TABLET | ORAL | Status: AC
  Administered 2015-05-23: 20 mg via ORAL
  Filled 2015-05-23: qty 1

## 2015-05-23 MED ORDER — PROPOFOL 10 MG/ML IV BOLUS
INTRAVENOUS | Status: DC | PRN
  Administered 2015-05-23: 150 mg via INTRAVENOUS

## 2015-05-23 MED ORDER — PHENYLEPHRINE HCL 10 MG/ML IJ SOLN
INTRAMUSCULAR | Status: DC | PRN
  Administered 2015-05-23 (×5): 100 ug via INTRAVENOUS

## 2015-05-23 MED ORDER — LIDOCAINE HCL (CARDIAC) 20 MG/ML IV SOLN
INTRAVENOUS | Status: DC | PRN
  Administered 2015-05-23: 80 mg via INTRAVENOUS

## 2015-05-23 MED ORDER — HYDROCODONE-ACETAMINOPHEN 5-325 MG PO TABS: 1.0000 | ORAL_TABLET | ORAL | Status: DC | PRN

## 2015-05-23 MED ORDER — CEFAZOLIN SODIUM-DEXTROSE 2-3 GM-% IV SOLR
INTRAVENOUS | Status: AC
  Administered 2015-05-23: 2 g via INTRAVENOUS
  Filled 2015-05-23: qty 50

## 2015-05-23 MED ORDER — FENTANYL CITRATE (PF) 100 MCG/2ML IJ SOLN
INTRAMUSCULAR | Status: DC | PRN
  Administered 2015-05-23: 50 ug via INTRAVENOUS

## 2015-05-23 SURGICAL SUPPLY — 51 items
BANDAGE ELASTIC 6 CLIP NS LF (GAUZE/BANDAGES/DRESSINGS) ×2 IMPLANT
BENZOIN TINCTURE PRP APPL 2/3 (GAUZE/BANDAGES/DRESSINGS) ×2 IMPLANT
BLADE SURG 15 STRL SS SAFETY (BLADE) ×2 IMPLANT
BNDG GAUZE 4.5X4.1 6PLY STRL (MISCELLANEOUS) ×2 IMPLANT
BULB RESERV EVAC DRAIN JP 100C (MISCELLANEOUS) ×2 IMPLANT
CANISTER SUCT 1200ML W/VALVE (MISCELLANEOUS) ×2 IMPLANT
CHLORAPREP W/TINT 26ML (MISCELLANEOUS) ×2 IMPLANT
CNTNR SPEC 2.5X3XGRAD LEK (MISCELLANEOUS) ×1
CONT SPEC 4OZ STER OR WHT (MISCELLANEOUS) ×1
CONTAINER SPEC 2.5X3XGRAD LEK (MISCELLANEOUS) ×1 IMPLANT
COVER PROBE FLX POLY STRL (MISCELLANEOUS) ×2 IMPLANT
DEVICE DUBIN SPECIMEN MAMMOGRA (MISCELLANEOUS) ×2 IMPLANT
DRAIN CHANNEL JP 15F RND 16 (MISCELLANEOUS) ×2 IMPLANT
DRAPE LAPAROTOMY TRNSV 106X77 (MISCELLANEOUS) ×2 IMPLANT
DRESSING TELFA 4X3 1S ST N-ADH (GAUZE/BANDAGES/DRESSINGS) ×2 IMPLANT
DRSG TELFA 3X8 NADH (GAUZE/BANDAGES/DRESSINGS) ×2 IMPLANT
ELECT CAUTERY BLADE TIP 2.5 (TIP) ×2
ELECTRODE CAUTERY BLDE TIP 2.5 (TIP) ×1 IMPLANT
GAUZE FLUFF 18X24 1PLY STRL (GAUZE/BANDAGES/DRESSINGS) ×2 IMPLANT
GAUZE SPONGE 4X4 12PLY STRL (GAUZE/BANDAGES/DRESSINGS) IMPLANT
GLOVE BIO SURGEON STRL SZ7.5 (GLOVE) ×4 IMPLANT
GLOVE INDICATOR 8.0 STRL GRN (GLOVE) ×4 IMPLANT
GOWN STRL REUS W/ TWL LRG LVL3 (GOWN DISPOSABLE) ×2 IMPLANT
GOWN STRL REUS W/TWL LRG LVL3 (GOWN DISPOSABLE) ×2
HARMONIC SCALPEL FOCUS (MISCELLANEOUS) IMPLANT
KIT RM TURNOVER STRD PROC AR (KITS) ×2 IMPLANT
LABEL OR SOLS (LABEL) IMPLANT
MARGIN MAP 10MM (MISCELLANEOUS) ×2 IMPLANT
NDL SAFETY 22GX1.5 (NEEDLE) ×2 IMPLANT
NEEDLE HYPO 25X1 1.5 SAFETY (NEEDLE) ×4 IMPLANT
PACK BASIN MINOR ARMC (MISCELLANEOUS) ×2 IMPLANT
PAD GROUND ADULT SPLIT (MISCELLANEOUS) ×2 IMPLANT
SHEARS FOC LG CVD HARMONIC 17C (MISCELLANEOUS) ×2 IMPLANT
SLEVE PROBE SENORX GAMMA FIND (MISCELLANEOUS) ×2 IMPLANT
STRIP CLOSURE SKIN 1/2X4 (GAUZE/BANDAGES/DRESSINGS) ×2 IMPLANT
SUT ETHILON 3-0 FS-10 30 BLK (SUTURE) ×4
SUT SILK 2 0 (SUTURE) ×1
SUT SILK 2-0 18XBRD TIE 12 (SUTURE) ×1 IMPLANT
SUT VIC AB 2-0 CT1 27 (SUTURE) ×4
SUT VIC AB 2-0 CT1 TAPERPNT 27 (SUTURE) ×4 IMPLANT
SUT VIC AB 3-0 SH 27 (SUTURE)
SUT VIC AB 3-0 SH 27X BRD (SUTURE) IMPLANT
SUT VIC AB 4-0 FS2 27 (SUTURE) IMPLANT
SUT VICRYL+ 3-0 144IN (SUTURE) ×2 IMPLANT
SUTURE EHLN 3-0 FS-10 30 BLK (SUTURE) ×2 IMPLANT
SWABSTK COMLB BENZOIN TINCTURE (MISCELLANEOUS) ×2 IMPLANT
SYR BULB IRRIG 60ML STRL (SYRINGE) ×2 IMPLANT
SYR CONTROL 10ML (SYRINGE) ×2 IMPLANT
SYRINGE 10CC LL (SYRINGE) ×2 IMPLANT
TAPE TRANSPORE STRL 2 31045 (GAUZE/BANDAGES/DRESSINGS) ×2 IMPLANT
WATER STERILE IRR 1000ML POUR (IV SOLUTION) ×2 IMPLANT

## 2015-05-23 NOTE — Transfer of Care (Signed)
Immediate Anesthesia Transfer of Care Note  Patient: Sarah Horne  Procedure(s) Performed: Procedure(s): LEFT BREAST WIDE EXCISION WITH AXILLARY DISSECTION, MASTOPLASTY  (Left)  Patient Location: PACU  Anesthesia Type:General  Level of Consciousness: sedated  Airway & Oxygen Therapy: Patient Spontanous Breathing and Patient connected to face mask oxygen  Post-op Assessment: Report given to RN and Post -op Vital signs reviewed and stable  Post vital signs: Reviewed and stable  Last Vitals: 100/69 100% 116 hr 20 97.3 Filed Vitals:   05/23/15 1223  BP:   Pulse:   Temp: 36.3 C  Resp:     Complications: No apparent anesthesia complications

## 2015-05-23 NOTE — Anesthesia Preprocedure Evaluation (Signed)
Anesthesia Evaluation  Patient identified by MRN, date of birth, ID band Patient awake    Reviewed: Allergy & Precautions, H&P , NPO status , Patient's Chart, lab work & pertinent test results, reviewed documented beta blocker date and time   History of Anesthesia Complications Negative for: history of anesthetic complications  Airway Mallampati: I  TM Distance: >3 FB Neck ROM: full    Dental no notable dental hx. (+) Teeth Intact   Pulmonary neg shortness of breath, neg sleep apnea, neg COPDneg recent URI, Current Smoker,  breath sounds clear to auscultation  Pulmonary exam normal       Cardiovascular Exercise Tolerance: Good hypertension, - angina+ CAD and + Past MI (back in the 90s, has been doing well enough that she was discharged from her cardiologist.) - Cardiac Stents and - CABG Normal cardiovascular exam- dysrhythmias - Valvular Problems/MurmursRhythm:regular Rate:Normal     Neuro/Psych negative neurological ROS  negative psych ROS   GI/Hepatic negative GI ROS, Neg liver ROS,   Endo/Other  negative endocrine ROS  Renal/GU negative Renal ROS  negative genitourinary   Musculoskeletal   Abdominal   Peds  Hematology negative hematology ROS (+)   Anesthesia Other Findings Past Medical History:   Hypertension                                                 Hyperlipidemia                                               Heart attack                                    1998         Cancer                                          2016           Comment:Stage II, ER positive, PR positive, HER-2/neu               overexpressing of the left breast.   Neuropathy                                                   Reproductive/Obstetrics negative OB ROS                             Anesthesia Physical Anesthesia Plan  ASA: II  Anesthesia Plan: General   Post-op Pain Management:    Induction:    Airway Management Planned:   Additional Equipment:   Intra-op Plan:   Post-operative Plan:   Informed Consent: I have reviewed the patients History and Physical, chart, labs and discussed the procedure including the risks, benefits and alternatives for the proposed anesthesia with the patient or authorized representative who has indicated his/her understanding and acceptance.   Dental Advisory  Given  Plan Discussed with: Anesthesiologist, CRNA and Surgeon  Anesthesia Plan Comments:         Anesthesia Quick Evaluation

## 2015-05-23 NOTE — Anesthesia Postprocedure Evaluation (Signed)
  Anesthesia Post-op Note  Patient: Sarah Horne  Procedure(s) Performed: Procedure(s): LEFT BREAST WIDE EXCISION WITH AXILLARY DISSECTION, MASTOPLASTY  (Left)  Anesthesia type:General  Patient location: PACU  Post pain: Pain level controlled  Post assessment: Post-op Vital signs reviewed, Patient's Cardiovascular Status Stable, Respiratory Function Stable, Patent Airway and No signs of Nausea or vomiting  Post vital signs: Reviewed and stable  Last Vitals:  Filed Vitals:   05/23/15 1409  BP: 125/87  Pulse:   Temp: 35.7 C  Resp: 18    Level of consciousness: awake, alert  and patient cooperative  Complications: No apparent anesthesia complications

## 2015-05-23 NOTE — Discharge Instructions (Signed)
AMBULATORY SURGERY  DISCHARGE INSTRUCTIONS   1) The drugs that you were given will stay in your system until tomorrow so for the next 24 hours you should not:  A) Drive an automobile B) Make any legal decisions C) Drink any alcoholic beverage   2) You may resume regular meals tomorrow.  Today it is better to start with liquids and gradually work up to solid foods.  You may eat anything you prefer, but it is better to start with liquids, then soup and crackers, and gradually work up to solid foods.   3) Please notify your doctor immediately if you have any unusual bleeding, trouble breathing, redness and pain at the surgery site, drainage, fever, or pain not relieved by medication. 4)   5) Your post-operative visit with Dr.                                     is: Date:                        Time:    Please call to schedule your post-operative visit.  6) Additional Instructions     Bulb Drain Home Care A bulb drain consists of a thin rubber tube and a soft, round bulb that creates a gentle suction. The rubber tube is placed in the area where you had surgery. A bulb is attached to the end of the tube that is outside the body. The bulb drain removes excess fluid that normally builds up in a surgical wound after surgery. The color and amount of fluid will vary. Immediately after surgery, the fluid is bright red and is a little thicker than water. It may gradually change to a yellow or pink color and become more thin and water-like. When the amount decreases to about 1 or 2 tbsp in 24 hours, your health care provider will usually remove it. DAILY CARE Keep the bulb flat (compressed) at all times, except while emptying it. The flatness creates suction. You can flatten the bulb by squeezing it firmly in the middle and then closing the cap. Keep sites where the tube enters the skin dry and covered with a bandage (dressing). Secure the tube 1-2 in (2.5-5.1 cm) below the insertion sites to  keep it from pulling on your stitches. The tube is stitched in place and will not slip out. Secure the bulb as directed by your health care provider. For the first 3 days after surgery, there usually is more fluid in the bulb. Empty the bulb whenever it becomes half full because the bulb does not create enough suction if it is too full. The bulb could also overflow. Write down how much fluid you remove each time you empty your drain. Add up the amount removed in 24 hours. Empty the bulb at the same time every day once the amount of fluid decreases and you only need to empty it once a day. Write down the amounts and the 24-hour totals to give to your health care provider. This helps your health care provider know when the tubes can be removed. EMPTYING THE BULB DRAIN Before emptying the bulb, get a measuring cup, a piece of paper and a pen, and wash your hands. Gently run your fingers down the tube (stripping) to empty any drainage from the tubing into the bulb. This may need to be done several times a day to  clear the tubing of clots and tissue. Open the bulb cap to release suction, which causes it to inflate. Do not touch the inside of the cap. Gently run your fingers down the tube (stripping) to empty any drainage from the tubing into the bulb. Hold the cap out of the way, and pour fluid into the measuring cup.  Squeeze the bulb to provide suction. Replace the cap.  Check the tape that holds the tube to your skin. If it is becoming loose, you can remove the loose piece of tape and apply a new one. Then, pin the bulb to your shirt.  Write down the amount of fluid you emptied out. Write down the date and each time you emptied your bulb drain. (If there are 2 bulbs, note the amount of drainage from each bulb and keep the totals separate. Your health care provider will want to know the total amounts for each drain and which tube is draining more.)  Flush the fluid down the toilet and wash your  hands.  Call your health care provider once you have less than 2 tbsp of fluid collecting in the bulb drain every 24 hours. If there is drainage around the tube site, change dressings and keep the area dry. Cleanse around tube with sterile saline and place dry gauze around site. This gauze should be changed when it is soiled. If it stays clean and unsoiled, it should still be changed daily.  SEEK MEDICAL CARE IF: Your drainage has a bad smell or is cloudy.  You have a fever.  Your drainage is increasing instead of decreasing.  Your tube fell out.  You have redness or swelling around the tube site.  You have drainage from a surgical wound.  Your bulb drain will not stay flat after you empty it.  MAKE SURE YOU:  Understand these instructions. Will watch your condition. Will get help right away if you are not doing well or get worse. Document Released: 09/11/2000 Document Revised: 01/29/2014 Document Reviewed: 02/17/2012 [facility] Patient Information 2015 [place], [place]. This information is not intended to replace advice given to you by your health care provider. Make sure you discuss any questions you have with your health care provider.

## 2015-05-23 NOTE — Op Note (Signed)
Preoperative diagnosis: Left breast cancer, status post neo-adjuvant chemotherapy.  Postoperative diagnosis: Same.  Operative procedure: Left breast wide excision with ultrasound guidance, mastoplasty, axillary dissection.  Operating surgeon: Ollen Bowl, M.D.  Anesthesia: Gen. by LMA, Marcaine 0.5% with 1-200,000 epinephrine, 30 mL local infiltration. Estimate blood loss: Less than 30 mL.  Clinical note: 59 year old woman observed a mass enlarging in her breast one year after biopsy showed evidence of malignancy. She underwent neo-adjuvant chemotherapy. Examination still identified a large area in the upper-outer quadrant of the left breast. She desired breast conservation even if this allowed residual tumor in the breast. She was injected with technetium sulfur colloid prior to procedure as well as 3 mL of saline/methylene blue diluted 2 to one after the induction of anesthesia. The patient received Kefzol preoperatively.  Operative note: After the induction of general anesthesia the breast chest and neck so was prepped with chlor prep and drape. The area of the tumor mass was outlined by ultrasound guidance. There was a 3 cm area tissue and overlying skin due to direct extension required resection. An elliptical incision was utilized in the upper-outer quadrants any from the 10 to 3:00 position. The skin was incised sharply and the remaining dissection completed with electrocautery. The external excision was continued down to and including the pectoralis fascia. The specimen was orientated, specimen radiograph confirmed clip in the middle of the tissue block and pathology reported the closest margins were deep branch is pectoralis fascia) and medial were dense fibrosis was evident.  While the wide excision was being examined attention was turned to the axilla. No areas of increased uptake were identified with the gamma finder in no blue lymph nodes were noted. In light of the evidence of  enlarged residual nodes on ultrasound prior to surgery was elected to proceed to axillary dissection. The axillary contents were swept south of the axillary vein with care taken to protect the thoracodorsal nerve artery and vein bundle as well as long thoracic nerve of Bell. These were sent in formalin for routine histology.  The medial margin of the breast was reexcised. The new medial margin was placed adjacent to the Telfa pad and the specimen orientated. This was sent for permanent section.  The breast was then elevated off the underlying pectoralis muscle taking the fascia with the breast tissue to the inframammary fold inferiorly and medially and the clavicle superiorly. The fascia was then approximated with interrupted 2-0 Vicryls figure-of-eight sutures. The adipose tissues and approximated multiple layers with similar fashion. The excess adipose tissue on the inferior flap was imbricated underneath the superior flap and anchored into position with interrupted 2-0 Vicryls sutures. The skin was elevated circumferentially or proximally 3 cm. Limiting dissection inferiorly was the nipple areolar complex. The deep dermal layer was approximated with interrupted 2-0 Vicryls sutures. The skin was closed with a running 4-0 Vicryls septic suture. Benzoin and Steri-Strips are applied.  Prior to wound closure a Blake drain was brought out through the inferior medial aspect of the breast and placed up into the axilla. This was anchored in place with 2-0 nylon.  Telfa, fluffed gauze, Kerlix and Ace wrap was applied.  The patient tolerated the procedure well and was taken to the recovery room stable condition.

## 2015-05-23 NOTE — H&P (Signed)
No change in cardiopulmonary status since last visit on August 3. Does not want mastectomy even if positive margins. Lungs: Clear. Cardio: RR.

## 2015-05-23 NOTE — Anesthesia Procedure Notes (Signed)
Procedure Name: LMA Insertion Date/Time: 05/23/2015 10:09 AM Performed by: Delaney Meigs Pre-anesthesia Checklist: Patient identified, Emergency Drugs available, Suction available, Patient being monitored and Timeout performed Patient Re-evaluated:Patient Re-evaluated prior to inductionOxygen Delivery Method: Circle system utilized and Simple face mask Preoxygenation: Pre-oxygenation with 100% oxygen Intubation Type: IV induction Ventilation: Mask ventilation without difficulty LMA Size: 3.5 Number of attempts: 1 Placement Confirmation: breath sounds checked- equal and bilateral and positive ETCO2 Tube secured with: Tape

## 2015-05-27 ENCOUNTER — Ambulatory Visit (INDEPENDENT_AMBULATORY_CARE_PROVIDER_SITE_OTHER): Payer: BLUE CROSS/BLUE SHIELD | Admitting: General Surgery

## 2015-05-27 ENCOUNTER — Encounter: Payer: Self-pay | Admitting: General Surgery

## 2015-05-27 ENCOUNTER — Telehealth: Payer: Self-pay | Admitting: General Surgery

## 2015-05-27 VITALS — BP 138/96 | HR 76 | Resp 14 | Ht 66.0 in | Wt 153.0 lb

## 2015-05-27 DIAGNOSIS — C50912 Malignant neoplasm of unspecified site of left female breast: Secondary | ICD-10-CM

## 2015-05-27 NOTE — Progress Notes (Signed)
Patient ID: Sarah Horne, female   DOB: 07-Feb-1956, 60 y.o.   MRN: [id number]  Chief Complaint  Patient presents with  . Routine Post Op    left partial mastectomy    HPI Sarah Horne is a 59 y.o. female here for a post op follow up from a left partial mastectomy done on 05/23/15. She reports the pain as controlled with the pain medication. She had her drain sheet with her.   Axillary nodes were not identified either with the radioisotope or methylene blue, and for that reason Dissection was completed. HPI  Past Medical History  Diagnosis Date  . Hypertension   . Hyperlipidemia   . Heart attack 1998  . Cancer 2016    Stage II, ER positive, PR positive, HER-2/neu overexpressing of the left breast.  . Neuropathy     Past Surgical History  Procedure Laterality Date  . Portacath placement Right 12-31-14    Dr Bary Castilla  . Breast lumpectomy with sentinel lymph node biopsy Left 05/23/2015    Procedure: LEFT BREAST WIDE EXCISION WITH AXILLARY DISSECTION, MASTOPLASTY ;  Surgeon: [name], MD;  Location: [facility] ORS;  Service: General;  Laterality: Left;  . Breast surgery Left 12/18/14    breast biopsy/INVASIVE DUCTAL CARCINOMA OF BREAST, NOTTINGHAM GRADE 2.  . Breast surgery  05/23/2015.    Wide excision/mastoplasty, axillary dissection (no SLN by technetium or methylene blue)    Family History  Problem Relation Age of Onset  . Breast cancer Maternal Aunt   . Breast cancer Cousin   . Brain cancer Maternal Uncle     Social History Social History  Substance Use Topics  . Smoking status: Current Every Day Smoker -- 0.50 packs/day for 18 years    Types: Cigarettes  . Smokeless tobacco: None  . Alcohol Use: No    Allergies  Allergen Reactions  . No Known Allergies     Current Outpatient Prescriptions  Medication Sig Dispense Refill  . aspirin 81 MG tablet Take 81 mg by mouth daily.    Marland Kitchen atenolol (TENORMIN) 25 MG tablet Take 1 tablet (25 mg total) by mouth daily.  (Patient taking differently: Take 25 mg by mouth every morning. ) 14 tablet 0  . furosemide (LASIX) 20 MG tablet Take 1 tablet orally once daily as needed for swelling. (Patient taking differently: Take 20 mg by mouth daily. Take 1 tablet orally once daily as needed for swelling.) 30 tablet 0  . gabapentin (NEURONTIN) 100 MG capsule Take 2 capsules (200 mg total) by mouth 3 (three) times daily. (Patient taking differently: Take 200 mg by mouth 3 (three) times daily. ) 180 capsule 3  . HYDROcodone-acetaminophen (NORCO) 5-325 MG per tablet Take 1-2 tablets by mouth every 4 (four) hours as needed for moderate pain. 30 tablet 0  . lisinopril (PRINIVIL,ZESTRIL) 10 MG tablet Take 1 tablet (10 mg total) by mouth daily. (Patient taking differently: Take 10 mg by mouth every morning. ) 14 tablet 0  . simvastatin (ZOCOR) 40 MG tablet Take 1 tablet (40 mg total) by mouth daily at 6 PM. 14 tablet 0   No current facility-administered medications for this visit.   Facility-Administered Medications Ordered in Other Visits  Medication Dose Route Frequency Provider Last Rate Last Dose  . 0.9 %  sodium chloride infusion   Intravenous Continuous [name], MD      . 0.9 %  sodium chloride infusion   Intravenous Continuous Lloyd Huger, MD 999 mL/hr at 04/24/15 1520    .  famotidine (PEPCID) IVPB 20 mg  20 mg Intravenous Q12H [name], MD   20 mg at 01/29/15 1230  . sodium chloride 0.45 % 1,000 mL infusion   Intravenous Continuous [name], MD 500 mL/hr at 04/04/15 1444    . sodium chloride 0.9 % injection 10 mL  10 mL Intravenous PRN [name], MD   10 mL at 04/04/15 1440    Review of Systems Review of Systems  Constitutional: Negative.   Respiratory: Negative.   Cardiovascular: Negative.     Blood pressure 138/96, pulse 76, resp. rate 14, height 5' 6"  (1.676 m), weight 153 lb (69.4 kg).  Physical Exam Physical Exam  Constitutional: She appears well-developed and well-nourished.   Pulmonary/Chest:    Left surgical site healing well.   Skin: Skin is warm and dry.  Psychiatric: She has a normal mood and affect.    Data Reviewed Pathology is pending at this time.  Assessment    Doing well status post wide excision/axillary dissection.  Near complete shoulder range of motion.    Plan    Drainage is still running about 60 mL per day, and the drain will be left in place. She will work on gentle stretching exercises in the shower. We'll try to get the last 10 of shoulder abduction over the next week.  She has been asked where her bra day and night to provide support to the breast. A light compressive wrap was applied today for her trip home.    PCP: Raynelle Chary 05/27/2015, 3:35 PM

## 2015-05-27 NOTE — Patient Instructions (Addendum)
Wear your bra at all times. You may shower, leave steri strips in place. Continue to write down drainage amounts. Return in one week.

## 2015-05-27 NOTE — Telephone Encounter (Signed)
The patient was notified that the verbal report from pathology today showed no residual disease.

## 2015-05-28 LAB — SURGICAL PATHOLOGY

## 2015-06-04 ENCOUNTER — Ambulatory Visit (INDEPENDENT_AMBULATORY_CARE_PROVIDER_SITE_OTHER): Payer: BLUE CROSS/BLUE SHIELD | Admitting: General Surgery

## 2015-06-04 ENCOUNTER — Encounter: Payer: Self-pay | Admitting: General Surgery

## 2015-06-04 VITALS — BP 126/76 | HR 72 | Resp 14 | Ht 66.0 in | Wt 153.0 lb

## 2015-06-04 DIAGNOSIS — C50912 Malignant neoplasm of unspecified site of left female breast: Secondary | ICD-10-CM

## 2015-06-04 NOTE — Progress Notes (Signed)
Patient ID: Sarah Horne, female   DOB: 1956/04/10, 59 y.o.   MRN: [id number]  Chief Complaint  Patient presents with  . Follow-up    Left partial mastectomy    HPI Sarah Horne is a 59 y.o. female here today for her post op visit. She had a partial mastectomy done on 05/23/15. She brought her drain sheet. She states she is doing well, she does feel numbness in her upper left axilla.  HPI  Past Medical History  Diagnosis Date  . Hypertension   . Hyperlipidemia   . Heart attack 1998  . Cancer 2016    Stage II, ER positive, PR positive, HER-2/neu overexpressing of the left breast.  . Neuropathy     Past Surgical History  Procedure Laterality Date  . Portacath placement Right 12-31-14    Dr Bary Castilla  . Breast lumpectomy with sentinel lymph node biopsy Left 05/23/2015    Procedure: LEFT BREAST WIDE EXCISION WITH AXILLARY DISSECTION, MASTOPLASTY ;  Surgeon: [name], MD;  Location: [facility] ORS;  Service: General;  Laterality: Left;  . Breast surgery Left 12/18/14    breast biopsy/INVASIVE DUCTAL CARCINOMA OF BREAST, NOTTINGHAM GRADE 2.  . Breast surgery  05/23/2015.    Wide excision/mastoplasty, axillary dissection. No residual invasive cancer, positive for residual DCIS. 0/2 nodes identified on axillary dissection. (no SLN by technetium or methylene blue)    Family History  Problem Relation Age of Onset  . Breast cancer Maternal Aunt   . Breast cancer Cousin   . Brain cancer Maternal Uncle     Social History Social History  Substance Use Topics  . Smoking status: Current Every Day Smoker -- 0.50 packs/day for 18 years    Types: Cigarettes  . Smokeless tobacco: None  . Alcohol Use: No    Allergies  Allergen Reactions  . No Known Allergies     Current Outpatient Prescriptions  Medication Sig Dispense Refill  . aspirin 81 MG tablet Take 81 mg by mouth daily.    Marland Kitchen atenolol (TENORMIN) 25 MG tablet Take 1 tablet (25 mg total) by mouth daily. (Patient taking  differently: Take 25 mg by mouth every morning. ) 14 tablet 0  . furosemide (LASIX) 20 MG tablet Take 1 tablet orally once daily as needed for swelling. (Patient taking differently: Take 20 mg by mouth daily. Take 1 tablet orally once daily as needed for swelling.) 30 tablet 0  . gabapentin (NEURONTIN) 100 MG capsule Take 2 capsules (200 mg total) by mouth 3 (three) times daily. (Patient taking differently: Take 200 mg by mouth 3 (three) times daily. ) 180 capsule 3  . HYDROcodone-acetaminophen (NORCO) 5-325 MG per tablet Take 1-2 tablets by mouth every 4 (four) hours as needed for moderate pain. 30 tablet 0  . lisinopril (PRINIVIL,ZESTRIL) 10 MG tablet Take 1 tablet (10 mg total) by mouth daily. (Patient taking differently: Take 10 mg by mouth every morning. ) 14 tablet 0  . simvastatin (ZOCOR) 40 MG tablet Take 1 tablet (40 mg total) by mouth daily at 6 PM. 14 tablet 0   No current facility-administered medications for this visit.   Facility-Administered Medications Ordered in Other Visits  Medication Dose Route Frequency Provider Last Rate Last Dose  . 0.9 %  sodium chloride infusion   Intravenous Continuous [name], MD      . 0.9 %  sodium chloride infusion   Intravenous Continuous Lloyd Huger, MD 999 mL/hr at 04/24/15 1520    . famotidine (  PEPCID) IVPB 20 mg  20 mg Intravenous Q12H [name], MD   20 mg at 01/29/15 1230  . sodium chloride 0.45 % 1,000 mL infusion   Intravenous Continuous [name], MD 500 mL/hr at 04/04/15 1444    . sodium chloride 0.9 % injection 10 mL  10 mL Intravenous PRN [name], MD   10 mL at 04/04/15 1440    Review of Systems Review of Systems  Constitutional: Negative.   Cardiovascular: Negative.     Blood pressure 126/76, pulse 72, resp. rate 14, height 5' 6"  (1.676 m), weight 153 lb (69.4 kg).  Physical Exam Physical Exam  Constitutional: She is oriented to person, place, and time.  Pulmonary/Chest: Right breast exhibits no  inverted nipple, no mass, no nipple discharge, no skin change and no tenderness.    Musculoskeletal:       Arms: Neurological: She is alert and oriented to person, place, and time.  Skin: Skin is warm and dry.  Psychiatric: Her behavior is normal.    Data Reviewed Drain record reviewed. Drain removed without incident. Telfa and Tegaderm applied to the drain site.  Assessment    Doing well status post wide excision/mastoplasty and intact later dissection.    Plan    Follow-up examination in one week. The patient was advised she may develop some fluid after the drain was removed and that she should call this is symptomatic.     Plan to return in one week.   PCP: Dr. Josetta Huddle, [place] Gleason 06/05/2015, 7:46 AM

## 2015-06-05 ENCOUNTER — Inpatient Hospital Stay: Payer: BLUE CROSS/BLUE SHIELD | Admitting: Internal Medicine

## 2015-06-05 ENCOUNTER — Encounter: Payer: Self-pay | Admitting: General Surgery

## 2015-06-11 ENCOUNTER — Telehealth: Payer: Self-pay | Admitting: *Deleted

## 2015-06-11 ENCOUNTER — Inpatient Hospital Stay: Payer: BLUE CROSS/BLUE SHIELD | Attending: Internal Medicine | Admitting: Internal Medicine

## 2015-06-11 VITALS — BP 128/90 | HR 114 | Temp 95.5°F | Resp 18 | Ht 66.0 in | Wt 152.3 lb

## 2015-06-11 DIAGNOSIS — Z7982 Long term (current) use of aspirin: Secondary | ICD-10-CM | POA: Insufficient documentation

## 2015-06-11 DIAGNOSIS — G620 Drug-induced polyneuropathy: Secondary | ICD-10-CM | POA: Diagnosis not present

## 2015-06-11 DIAGNOSIS — I10 Essential (primary) hypertension: Secondary | ICD-10-CM | POA: Diagnosis not present

## 2015-06-11 DIAGNOSIS — T451X5S Adverse effect of antineoplastic and immunosuppressive drugs, sequela: Secondary | ICD-10-CM | POA: Diagnosis not present

## 2015-06-11 DIAGNOSIS — I252 Old myocardial infarction: Secondary | ICD-10-CM | POA: Diagnosis not present

## 2015-06-11 DIAGNOSIS — C50912 Malignant neoplasm of unspecified site of left female breast: Secondary | ICD-10-CM

## 2015-06-11 DIAGNOSIS — Z170 Estrogen receptor positive status [ER+]: Secondary | ICD-10-CM

## 2015-06-11 DIAGNOSIS — Z803 Family history of malignant neoplasm of breast: Secondary | ICD-10-CM | POA: Diagnosis not present

## 2015-06-11 DIAGNOSIS — Z79899 Other long term (current) drug therapy: Secondary | ICD-10-CM | POA: Insufficient documentation

## 2015-06-11 DIAGNOSIS — F17210 Nicotine dependence, cigarettes, uncomplicated: Secondary | ICD-10-CM | POA: Diagnosis not present

## 2015-06-11 DIAGNOSIS — C773 Secondary and unspecified malignant neoplasm of axilla and upper limb lymph nodes: Secondary | ICD-10-CM | POA: Insufficient documentation

## 2015-06-11 DIAGNOSIS — R600 Localized edema: Secondary | ICD-10-CM | POA: Diagnosis not present

## 2015-06-11 DIAGNOSIS — C50412 Malignant neoplasm of upper-outer quadrant of left female breast: Secondary | ICD-10-CM

## 2015-06-11 DIAGNOSIS — E785 Hyperlipidemia, unspecified: Secondary | ICD-10-CM

## 2015-06-11 DIAGNOSIS — Z9221 Personal history of antineoplastic chemotherapy: Secondary | ICD-10-CM | POA: Diagnosis not present

## 2015-06-11 NOTE — Progress Notes (Signed)
Patient is here for follow-up post breast surgery. Patient had a partial mastectomy on August 25th by Dr. Bary Castilla. Patient states that she has been doing well since surgery. She is scheduled to see Dr. Bary Castilla tomorrow to be released to return to work.

## 2015-06-11 NOTE — Telephone Encounter (Signed)
Called pt to let her know that dr Baruch Gouty can see pt tom 9/14 at 9 am.  She is fine with that and will make appt

## 2015-06-12 ENCOUNTER — Ambulatory Visit
Admission: RE | Admit: 2015-06-12 | Discharge: 2015-06-12 | Disposition: A | Discharge status: Home / Self Care | Payer: BLUE CROSS/BLUE SHIELD | Source: Ambulatory Visit | Attending: Radiation Oncology | Admitting: Radiation Oncology

## 2015-06-12 ENCOUNTER — Ambulatory Visit (INDEPENDENT_AMBULATORY_CARE_PROVIDER_SITE_OTHER): Payer: BLUE CROSS/BLUE SHIELD | Admitting: General Surgery

## 2015-06-12 ENCOUNTER — Encounter: Payer: Self-pay | Admitting: General Surgery

## 2015-06-12 ENCOUNTER — Encounter: Payer: Self-pay | Admitting: Radiation Oncology

## 2015-06-12 VITALS — BP 112/62 | HR 88 | Resp 16 | Ht 66.0 in | Wt 151.8 lb

## 2015-06-12 VITALS — BP 122/88 | HR 107 | Temp 95.9°F | Resp 20 | Wt 152.0 lb

## 2015-06-12 DIAGNOSIS — Z170 Estrogen receptor positive status [ER+]: Secondary | ICD-10-CM | POA: Insufficient documentation

## 2015-06-12 DIAGNOSIS — C773 Secondary and unspecified malignant neoplasm of axilla and upper limb lymph nodes: Secondary | ICD-10-CM | POA: Insufficient documentation

## 2015-06-12 DIAGNOSIS — C50912 Malignant neoplasm of unspecified site of left female breast: Secondary | ICD-10-CM

## 2015-06-12 DIAGNOSIS — F17210 Nicotine dependence, cigarettes, uncomplicated: Secondary | ICD-10-CM | POA: Insufficient documentation

## 2015-06-12 DIAGNOSIS — Z510 Encounter for antineoplastic radiation therapy: Secondary | ICD-10-CM | POA: Insufficient documentation

## 2015-06-12 NOTE — Consult Note (Signed)
Except an outstanding is perfect of Radiation Oncology NEW PATIENT EVALUATION  Name: Sarah Horne  MRN: [id number]  Date:   06/12/2015     DOB: 1955/10/25   This 59 y.o. female patient presents to the clinic for initial evaluation of breast cancer stage II (T2 N1 M0) status post new adjuvant chemotherapy than wide local excision and sentinel node biopsy.Marland Kitchen  REFERRING PHYSICIAN: [name], MD  CHIEF COMPLAINT:  Chief Complaint  Patient presents with  . Breast Cancer    Pt is here for initial consultation of breast cancer.      DIAGNOSIS: The encounter diagnosis was Malignant neoplasm of female breast, left.   PREVIOUS INVESTIGATIONS:  Pathology reports reviewed PET CT scan mammograms ultrasound reviewed Clinical notes reviewed  HPI: Patient is a 59 year old female who presented with a self discovered mass in the left breast. This was confirmed on mammogram to be a 4.3 x 4 cm lesion with a 2.7 left axillary lymph node which was abnormal. Biopsy of the axillary lymph node was positive for poorly differentiated carcinoma consistent with breast primary ER positive PR positive HER-2/neu 3+ on IHC. PET CT scan was performed showing hypermetabolic left breast mass and also evidence of hypermetabolic activity in the left axilla. There is also a lesion in the liver noted which on biopsy was negative for malignancy. She went on to have new adjuvant chemotherapy consisting of carboplatin and Taxotere Herceptin and per [place] with Neulasta support. She has done well although developed some mild peripheral neuropathy. She continues on Herceptin at this time. She underwent a wide local excision and sentinel node lymph node biopsy. There was no residual invasive cancer seen. 2 sentinel lymph nodes were both negative for metastatic disease there was fibrosis noted consistent with tumor response. She is seen today for consideration of treatment. Her case was presented at our weekly tumor breast  conference. She specifically denies breast tenderness cough or bone pain. Does have some slight sequela of peripheral neuropathy.  PLANNED TREATMENT REGIMEN: Whole breast and peripheral lymphatic radiation  PAST MEDICAL HISTORY:  has a past medical history of Hypertension; Hyperlipidemia; Heart attack (1998); Cancer (2016); and Neuropathy.    PAST SURGICAL HISTORY:  Past Surgical History  Procedure Laterality Date  . Portacath placement Right 12-31-14    Dr Bary Castilla  . Breast lumpectomy with sentinel lymph node biopsy Left 05/23/2015    Procedure: LEFT BREAST WIDE EXCISION WITH AXILLARY DISSECTION, MASTOPLASTY ;  Surgeon: [name], MD;  Location: [facility] ORS;  Service: General;  Laterality: Left;  . Breast surgery Left 12/18/14    breast biopsy/INVASIVE DUCTAL CARCINOMA OF BREAST, NOTTINGHAM GRADE 2.  . Breast surgery  05/23/2015.    Wide excision/mastoplasty, axillary dissection. No residual invasive cancer, positive for residual DCIS. 0/2 nodes identified on axillary dissection. (no SLN by technetium or methylene blue)    FAMILY HISTORY: family history includes Brain cancer in her maternal uncle; Breast cancer in her cousin and maternal aunt.  SOCIAL HISTORY:  reports that she has been smoking Cigarettes.  She has a 9 pack-year smoking history. She does not have any smokeless tobacco history on file. She reports that she does not drink alcohol or use illicit drugs.  ALLERGIES: No known allergies  MEDICATIONS:  Current Outpatient Prescriptions  Medication Sig Dispense Refill  . aspirin 81 MG tablet Take 81 mg by mouth daily.    Marland Kitchen atenolol (TENORMIN) 25 MG tablet Take 1 tablet (25 mg total) by mouth daily. (Patient  taking differently: Take 25 mg by mouth every morning. ) 14 tablet 0  . furosemide (LASIX) 20 MG tablet Take 1 tablet orally once daily as needed for swelling. (Patient taking differently: Take 20 mg by mouth daily. Take 1 tablet orally once daily as needed for swelling.) 30  tablet 0  . gabapentin (NEURONTIN) 100 MG capsule Take 2 capsules (200 mg total) by mouth 3 (three) times daily. (Patient taking differently: Take 200 mg by mouth 3 (three) times daily. ) 180 capsule 3  . lisinopril (PRINIVIL,ZESTRIL) 10 MG tablet Take 1 tablet (10 mg total) by mouth daily. (Patient taking differently: Take 10 mg by mouth every morning. ) 14 tablet 0  . simvastatin (ZOCOR) 40 MG tablet Take 1 tablet (40 mg total) by mouth daily at 6 PM. 14 tablet 0   No current facility-administered medications for this encounter.   Facility-Administered Medications Ordered in Other Encounters  Medication Dose Route Frequency Provider Last Rate Last Dose  . 0.9 %  sodium chloride infusion   Intravenous Continuous [name], MD      . 0.9 %  sodium chloride infusion   Intravenous Continuous Lloyd Huger, MD 999 mL/hr at 04/24/15 1520    . famotidine (PEPCID) IVPB 20 mg  20 mg Intravenous Q12H [name], MD   20 mg at 01/29/15 1230  . sodium chloride 0.45 % 1,000 mL infusion   Intravenous Continuous [name], MD 500 mL/hr at 04/04/15 1444    . sodium chloride 0.9 % injection 10 mL  10 mL Intravenous PRN [name], MD   10 mL at 04/04/15 1440    ECOG PERFORMANCE STATUS:  0 - Asymptomatic  REVIEW OF SYSTEMS:  Patient denies any weight loss, fatigue, weakness, fever, chills or night sweats. Patient denies any loss of vision, blurred vision. Patient denies any ringing  of the ears or hearing loss. No irregular heartbeat. Patient denies heart murmur or history of fainting. Patient denies any chest pain or pain radiating to her upper extremities. Patient denies any shortness of breath, difficulty breathing at night, cough or hemoptysis. Patient denies any swelling in the lower legs. Patient denies any nausea vomiting, vomiting of blood, or coffee ground material in the vomitus. Patient denies any stomach pain. Patient states has had normal bowel movements no significant  constipation or diarrhea. Patient denies any dysuria, hematuria or significant nocturia. Patient denies any problems walking, swelling in the joints or loss of balance. Patient denies any skin changes, loss of hair or loss of weight. Patient denies any excessive worrying or anxiety or significant depression. Patient denies any problems with insomnia. Patient denies excessive thirst, polyuria, polydipsia. Patient denies any swollen glands, patient denies easy bruising or easy bleeding. Patient denies any recent infections, allergies or URI. Patient "s visual fields have not changed significantly in recent time.    PHYSICAL EXAM: BP 122/88 mmHg  Pulse 107  Temp(Src) 95.9 F (35.5 C)  Resp 20  Wt 152 lb 0.1 oz (68.95 kg) She is status post wide local excision of the left breast. Incision is healing well are still Steri-Strips remaining which I have removed. No dominant mass or nodularity is noted in either breast in 2 positions examined. No axillary or supraclavicular adenopathy is appreciated. Well-developed well-nourished patient in NAD. HEENT reveals PERLA, EOMI, discs not visualized.  Oral cavity is clear. No oral mucosal lesions are identified. Neck is clear without evidence of cervical or supraclavicular adenopathy. Lungs are clear to A&P. Cardiac examination is  essentially unremarkable with regular rate and rhythm without murmur rub or thrill. Abdomen is benign with no organomegaly or masses noted. Motor sensory and DTR levels are equal and symmetric in the upper and lower extremities. Cranial nerves II through XII are grossly intact. Proprioception is intact. No peripheral adenopathy or edema is identified. No motor or sensory levels are noted. Crude visual fields are within normal range.    LABORATORY DATA: Pathology reports reviewed    RADIOLOGY RESULTS: Mammograms PET scans are reviewed   IMPRESSION: Stage IIB invasive mammary carcinoma left breast status post new adjuvant chemotherapy  than wide local excision and sentinel node biopsy for ER/PR positive HER-2/neu overexpressed triple positive invasive mammary carcinoma for whole breast and peripheral lymphatic radiation plus continuing Herceptin support.  PLAN: At this time I have recommended whole breast and peripheral lymphatic radiation based on her initial disease showing 4 cm lesion as well as hypermetabolic lymph nodes. I will plan on delivering 5000 cGy to both her left breast and peripheral lymphatics. Risks and benefits of treatment including skin reaction, fatigue, alteration of blood counts, inclusion of superficial lung and possibility of lymphedema of her left upper extremity all were discussed with the patient. I've encouraged her to increase her exercise of her left upper extremity as much as possible. I have set up and ordered CT simulation for early next week. Patient also be candidate for aromatase inhibitor therapy after completion of radiation.  I would like to take this opportunity for allowing me to participate in the care of your patient.Armstead Peaks., MD

## 2015-06-12 NOTE — Patient Instructions (Signed)
The patient is aware to call back for any questions or concerns.  

## 2015-06-12 NOTE — Progress Notes (Signed)
Patient ID: Sarah Horne, female   DOB: 30-Jul-1956, 59 y.o.   MRN: [id number]  Chief Complaint  Patient presents with  . Routine Post Op    HPI Sarah Horne is a 59 y.o. female here today for follow up of left partial mastectomy done on 05/23/2015. Drain tube was removed 06/04/15. Patient is doing well, she is healing well, no drainage, no complaints. HPI  Past Medical History  Diagnosis Date  . Hypertension   . Hyperlipidemia   . Heart attack 1998  . Cancer 2016    Stage II, ER positive, PR positive, HER-2/neu overexpressing of the left breast.  . Neuropathy     Past Surgical History  Procedure Laterality Date  . Portacath placement Right 12-31-14    Dr Bary Castilla  . Breast lumpectomy with sentinel lymph node biopsy Left 05/23/2015    Procedure: LEFT BREAST WIDE EXCISION WITH AXILLARY DISSECTION, MASTOPLASTY ;  Surgeon: [name], MD;  Location: [facility] ORS;  Service: General;  Laterality: Left;  . Breast surgery Left 12/18/14    breast biopsy/INVASIVE DUCTAL CARCINOMA OF BREAST, NOTTINGHAM GRADE 2.  . Breast surgery  05/23/2015.    Wide excision/mastoplasty, axillary dissection. No residual invasive cancer, positive for residual DCIS. 0/2 nodes identified on axillary dissection. (no SLN by technetium or methylene blue)    Family History  Problem Relation Age of Onset  . Breast cancer Maternal Aunt   . Breast cancer Cousin   . Brain cancer Maternal Uncle     Social History Social History  Substance Use Topics  . Smoking status: Current Every Day Smoker -- 0.50 packs/day for 18 years    Types: Cigarettes  . Smokeless tobacco: None  . Alcohol Use: No    Allergies  Allergen Reactions  . No Known Allergies     Current Outpatient Prescriptions  Medication Sig Dispense Refill  . aspirin 81 MG tablet Take 81 mg by mouth daily.    Marland Kitchen atenolol (TENORMIN) 25 MG tablet Take 1 tablet (25 mg total) by mouth daily. (Patient taking differently: Take 25 mg by mouth every  morning. ) 14 tablet 0  . furosemide (LASIX) 20 MG tablet Take 1 tablet orally once daily as needed for swelling. (Patient taking differently: Take 20 mg by mouth daily. Take 1 tablet orally once daily as needed for swelling.) 30 tablet 0  . gabapentin (NEURONTIN) 100 MG capsule Take 2 capsules (200 mg total) by mouth 3 (three) times daily. (Patient taking differently: Take 200 mg by mouth 3 (three) times daily. ) 180 capsule 3  . lisinopril (PRINIVIL,ZESTRIL) 10 MG tablet Take 1 tablet (10 mg total) by mouth daily. (Patient taking differently: Take 10 mg by mouth every morning. ) 14 tablet 0  . simvastatin (ZOCOR) 40 MG tablet Take 1 tablet (40 mg total) by mouth daily at 6 PM. 14 tablet 0   No current facility-administered medications for this visit.   Facility-Administered Medications Ordered in Other Visits  Medication Dose Route Frequency Provider Last Rate Last Dose  . 0.9 %  sodium chloride infusion   Intravenous Continuous [name], MD      . 0.9 %  sodium chloride infusion   Intravenous Continuous Lloyd Huger, MD 999 mL/hr at 04/24/15 1520    . famotidine (PEPCID) IVPB 20 mg  20 mg Intravenous Q12H [name], MD   20 mg at 01/29/15 1230  . sodium chloride 0.45 % 1,000 mL infusion   Intravenous Continuous [name], MD 500  mL/hr at 04/04/15 1444    . sodium chloride 0.9 % injection 10 mL  10 mL Intravenous PRN [name], MD   10 mL at 04/04/15 1440    Review of Systems Review of Systems  Constitutional: Negative.   Respiratory: Negative.   Cardiovascular: Negative.     Blood pressure 112/62, pulse 88, resp. rate 16, height 5' 6"  (1.676 m), weight 151 lb 12.8 oz (68.856 kg).  Physical Exam Physical Exam  Constitutional: She is oriented to person, place, and time. She appears well-developed and well-nourished.  Pulmonary/Chest:    Healing well the scar is 10 to 2 left breast.  Neurological: She is alert and oriented to person, place, and time.   Skin: Skin is warm and dry.  Psychiatric: Her behavior is normal.    Data Reviewed Upper extremity measurements were obtained at a location 15 cm superior as well as 10 and 20 cm inferior to the olecranon process.  Right: 33, 27, 20. Cm.. Left: 33.5, 28, 21 cm  Assessment    Doing well status post wide excision, sentinel node biopsy and subsequent drain removal.    Plan    The patient is scheduled for simulation next week for whole breast radiation. Plan on a follow-up examination here in one month.     Follow up one month.  PCP: Dr. Josetta Huddle, [place] Gleason 06/13/2015, 7:38 AM

## 2015-06-17 ENCOUNTER — Ambulatory Visit: Payer: BLUE CROSS/BLUE SHIELD

## 2015-06-18 ENCOUNTER — Telehealth: Payer: Self-pay | Admitting: *Deleted

## 2015-06-18 ENCOUNTER — Other Ambulatory Visit: Payer: Self-pay | Admitting: *Deleted

## 2015-06-18 ENCOUNTER — Ambulatory Visit
Admission: RE | Admit: 2015-06-18 | Discharge: 2015-06-18 | Disposition: A | Discharge status: Home / Self Care | Payer: BLUE CROSS/BLUE SHIELD | Source: Ambulatory Visit | Attending: Radiation Oncology | Admitting: Radiation Oncology

## 2015-06-18 DIAGNOSIS — F17210 Nicotine dependence, cigarettes, uncomplicated: Secondary | ICD-10-CM | POA: Diagnosis not present

## 2015-06-18 DIAGNOSIS — Z510 Encounter for antineoplastic radiation therapy: Secondary | ICD-10-CM | POA: Diagnosis not present

## 2015-06-18 DIAGNOSIS — C773 Secondary and unspecified malignant neoplasm of axilla and upper limb lymph nodes: Secondary | ICD-10-CM | POA: Diagnosis not present

## 2015-06-18 DIAGNOSIS — C50912 Malignant neoplasm of unspecified site of left female breast: Secondary | ICD-10-CM | POA: Diagnosis not present

## 2015-06-18 DIAGNOSIS — Z170 Estrogen receptor positive status [ER+]: Secondary | ICD-10-CM | POA: Diagnosis not present

## 2015-06-18 MED ORDER — CEPHALEXIN 500 MG PO CAPS: 500.0000 mg | ORAL_CAPSULE | Freq: Two times a day (BID) | ORAL | Status: DC

## 2015-06-18 NOTE — Telephone Encounter (Signed)
Called pt and left message that we recvd a refill from [facility] for her gabapentin and while she is was in the office last week she was originally on 2 tablet tid and she had stated that she a lot of times does not take the middle of the day pills because she is at work and does not remember to bring them with her. If she is going to cont. This way then we will need to adjust the dose and how often in rx.  She called back about 5 pm and said that she is going to start taking it tid and we had told her in the office that she could inc. To 3 at a time so she will be on 3 tablets three times a day. Ma Hillock was agreeable to this and signed the refill.  Also pt states she is completely out and needs a week's quantity called into her pharmacy while she waits for it to come in from mail in order.  She knows she will have to pay for it out of pocket.  It was called into her pharmacy for 7 day supply.

## 2015-06-20 ENCOUNTER — Other Ambulatory Visit: Payer: Self-pay | Admitting: *Deleted

## 2015-06-20 DIAGNOSIS — C50912 Malignant neoplasm of unspecified site of left female breast: Secondary | ICD-10-CM

## 2015-06-22 NOTE — Progress Notes (Signed)
[place]  Telephone:([phone] Fax:([phone]) [phone]     ID: TANEA MOGA OB: [birth year]  MR#: [id number]  WPY#:[id number]  Patient Care Team: [name], MD as PCP - General (Family Medicine) [name], MD (General Surgery) [name], MD as Attending Physician (Internal Medicine)  CHIEF COMPLAINT/DIAGNOSIS:   Invasive ductal carcinoma of the left breast grade 2 on biopsy  (clinically T2N1Mx, stage II disease). Tumor size 4.3 x 4.0 x 3.4 cm. Left axillary lymph node measuring 2.7 cm. Left axillary lymph node biopsy positive for involvement by poorly differentiated carcinoma consistent with breast primary, left breast mass biopsy showed invasive ductal carcinoma grade 2, biopsy done on 12/18/2014. ER positive greater than 90%, PR positive 51% to 90%, HER2/neu positive (3+ on IHC). PET scan 12/31/14 - IMPRESSION: 1. Hypermetabolic left breast mass consists with primary carcinoma. 2. Evidence of local nodal metastasis to the left axilla. 3. No evidence of central nodal metastasis to mediastinum. 4. Two lesions in liver with mild metabolic activity. Differential would include focal nodular hyperplasia versus metastatic breast cancer. Recommend either a contrast-enhanced liver MRI or for added specificity ultrasound-guided hepatic biopsy. CT-guided liver biopsy 01/04/15 - negative for malignancy.  Patient got Neoadjuvant chemotherapy with Taxotere/Carboplatin/Herceptin/Perjeta (got 6 cycles from 01/08/15 - 04/23/15). 05/23/15 - Left breast lumpectomy and node study. Pathology Report - Negative for residual malignancy in breast and 2 axillary nodes.   HISTORY OF PRESENT ILLNESS:  Patient returns for continued oncology evaluation, she has undergone breast surgery on 8/25, states she is healing well. Appetite remains good. States that she still has tingling and numbness in the hands and feet but denies difficulty in using of hands. Denies any imbalance or falls. She  continues to work and states that she forgets to take afternoon dose of gabapentin. Denies nausea or vomiting. No diarrhea. No new dyspnea, orthopnea, PND or leg swelling. No fevers.   REVIEW OF SYSTEMS:   ROS As in HPI above. In addition, no fevers. Denies new headaches or epistaxis. No cough, shortness of breath. No wheezing or hemoptysis. No retrosternal chest pain. No orthopnea, PND. abdominal pain, nausea or vomiting. No dysuria or hematuria. No new bone pain. No polyuria or polydipsia. No imbalance or falls. PS ECOG 1.  PAST MEDICAL HISTORY: Past Medical History  Diagnosis Date  . Hypertension   . Hyperlipidemia   . Heart attack 1998  . Cancer 2016    Stage II, ER positive, PR positive, HER-2/neu overexpressing of the left breast.  . Neuropathy     PAST SURGICAL HISTORY: Past Surgical History  Procedure Laterality Date  . Portacath placement Right 12-31-14    Dr Bary Castilla  . Breast lumpectomy with sentinel lymph node biopsy Left 05/23/2015    Procedure: LEFT BREAST WIDE EXCISION WITH AXILLARY DISSECTION, MASTOPLASTY ;  Surgeon: [name], MD;  Location: [facility] ORS;  Service: General;  Laterality: Left;  . Breast surgery Left 12/18/14    breast biopsy/INVASIVE DUCTAL CARCINOMA OF BREAST, NOTTINGHAM GRADE 2.  . Breast surgery  05/23/2015.    Wide excision/mastoplasty, axillary dissection. No residual invasive cancer, positive for residual DCIS. 0/2 nodes identified on axillary dissection. (no SLN by technetium or methylene blue)    FAMILY HISTORY Family History  Problem Relation Age of Onset  . Breast cancer Maternal Aunt   . Breast cancer Cousin   . Brain cancer Maternal Uncle    SOCIAL HISTORY: Social History  Substance Use Topics  . Smoking status: Current Every  Day Smoker -- 0.50 packs/day for 18 years    Types: Cigarettes  . Smokeless tobacco: Not on file  . Alcohol Use: No     Allergies  Allergen Reactions  . No Known Allergies     Current Outpatient  Prescriptions  Medication Sig Dispense Refill  . aspirin 81 MG tablet Take 81 mg by mouth daily.    Marland Kitchen atenolol (TENORMIN) 25 MG tablet Take 1 tablet (25 mg total) by mouth daily. (Patient taking differently: Take 25 mg by mouth every morning. ) 14 tablet 0  . furosemide (LASIX) 20 MG tablet Take 1 tablet orally once daily as needed for swelling. (Patient taking differently: Take 20 mg by mouth daily. Take 1 tablet orally once daily as needed for swelling.) 30 tablet 0  . gabapentin (NEURONTIN) 100 MG capsule Take 2 capsules (200 mg total) by mouth 3 (three) times daily. (Patient taking differently: Take 200 mg by mouth 3 (three) times daily. ) 180 capsule 3  . lisinopril (PRINIVIL,ZESTRIL) 10 MG tablet Take 1 tablet (10 mg total) by mouth daily. (Patient taking differently: Take 10 mg by mouth every morning. ) 14 tablet 0  . simvastatin (ZOCOR) 40 MG tablet Take 1 tablet (40 mg total) by mouth daily at 6 PM. 14 tablet 0  . cephALEXin (KEFLEX) 500 MG capsule Take 1 capsule (500 mg total) by mouth 2 (two) times daily. 20 capsule 0   No current facility-administered medications for this visit.   Facility-Administered Medications Ordered in Other Visits  Medication Dose Route Frequency Provider Last Rate Last Dose  . 0.9 %  sodium chloride infusion   Intravenous Continuous [name], MD      . 0.9 %  sodium chloride infusion   Intravenous Continuous Lloyd Huger, MD 999 mL/hr at 04/24/15 1520    . famotidine (PEPCID) IVPB 20 mg  20 mg Intravenous Q12H [name], MD   20 mg at 01/29/15 1230  . sodium chloride 0.45 % 1,000 mL infusion   Intravenous Continuous [name], MD 500 mL/hr at 04/04/15 1444    . sodium chloride 0.9 % injection 10 mL  10 mL Intravenous PRN [name], MD   10 mL at 04/04/15 1440    OBJECTIVE: Filed Vitals:   06/11/15 1144  BP: 128/90  Pulse: 114  Temp: 95.5 F (35.3 C)  Resp: 18     Body mass index is 24.6 kg/(m^2).    ECOG FS:1 - Symptomatic  but completely ambulatory  GENERAL: Patient is alert and oriented and in no acute distress. No icterus. HEENT: EOMs intact. No cervical lymphadenopathy. CVS: S1, S2, regular rate and rhythm.   LUNGS: Bilaterally clear to auscultation. No crepitations or rhonchi. ABDOMEN: Soft, nontender. No hepatomegaly. BREASTS: left breast surgical site healing well. No dominant masses in either breast. No axillary adenopathy on either side. Exam performed in presence of a nurse.  LAB RESULTS: Prior - WBC 3.6, hemoglobin 11.3, platelets 265, ANC 2.0     Lab Results  Component Value Date   LABCA2 33.8 04/16/2015    STUDIES:  12/18/14 - Left Breast mass and left axillary node biopsy.  DIAGNOSIS:  A. BREAST, LEFT AT 1:00; CORE NEEDLE BIOPSY:  INVASIVE DUCTAL CARCINOMA OF BREAST, NOTTINGHAM GRADE 2.   LYMPHOVASCULAR INVOLVEMENT IS NOTED. SEE COMMENT.  B. LYMPH NODE, LEFT AXILLA; CORE NEEDLE BIOPSY:   INVOLVEMENT BY POORLY DIFFERENTIATED CARCINOMA, CONSISTENT WITH BREAST PRIMARY.  LYMPHOID TISSUE PRESENT.  Comment:  Invasive carcinoma is present in both  tissue cores and measures 1.8 cm in maximum extent on the glass slide.The tumor shows little tubule  formation (3/3), the nuclei very moderately in size and shape (2/3), and the mitotic rate is high (3/3).  ER positive greater than 90%, PR positive 51% to 90%, HER2/neu positive (3+ on IHC).  01/04/15 - Liver biopsy. DIAGNOSIS:  LIVER MASS, NOS; CORE NEEDLE BIOPSIES:  STEATOSIS, MILD WITH RARE FOCI OF HEPATOCYTIC NECROSIS.  MINIMAL PORTAL MONONUCLEAR INFLAMMATORY INFILTRATE.  MINIMAL INCREAED STAINABLE IRON, PREDOMINANTLY IN KUPFFER CELLS.  FRAGMENTS SUGGESTIVE OF HEPATOCYTIC REGENERATIVE/PROLIFERATIVE PROCESS.  NO FEATURES OF FOCAL NODULAR HYPERPLASIA OR METASTATIC CARCINOMA SEEN. SEE COMMENT.   04/18/15 - MUGA Scan.  FINDINGS:  The left ventricular ejection fraction is equal to 62.7%. Normal left ventricular wall motion. IMPRESSION:  1. Left ventricular  ejection fraction equals 62.7%.  05/23/15 - Left breast lumpectomy and node study. Pathology Report -  DIAGNOSIS:  A. LEFT BREAST; WIDE EXCISION:  NEGATIVE FOR RESIDUAL INVASIVE CARCINOMA AFTER PRESURGICAL THERAPY. RESIDUAL DUCTAL CARCINOMA IN SITU AND MODERATE FIBROSIS.  THE MARGINS OF EXCISION ARE NEGATIVE. SEE SUMMARY BELOW.  B. LEFT BREAST, MEDIAL MARGIN; RE-EXCISION:  BREAST TISSUE WITH PERILOBULAR CHRONIC INFLAMMATION.  NO TUMOR SEEN.  C. LEFT AXILLARY CONTENTS; DISSECTION:  NO RESIDUAL TUMOR SEEN IN AT LEAST TWO PREDOMINANTLY FIBROTIC LYMPH NODES (0/2).     ASSESSMENT/PLAN:   1. Invasive ductal carcinoma of the left breast grade 2 on biopsy  (clinically T2N1Mx, stage II disease). Tumor size 4.3 x 4.0 x 3.4 cm. Left axillary lymph node measuring 2.7 cm. Left axillary lymph node biopsy positive for involvement by poorly differentiated carcinoma consistent with breast primary, left breast mass biopsy showed invasive ductal carcinoma grade 2, biopsy done on 12/18/2014. ER positive greater than 90%, PR positive 51% to 90%, HER2/neu positive (3+ on IHC). CT-guided liver biopsy 01/04/15 negative for malignancy. Patient started on Neoadjuvant chemotherapy with Taxotere/Carboplatin/Herceptin/Perjeta today (01/08/15) -  Reviewed recent surgical pathology and explained to patient that she has had complete pathological response to neoadjuvant chemotherapy. Per d/w Dr.Chrystal, she will be planned for adjuvant radiation. Recent MUGA scan is normal LEVF at 62.7%. Will resume on Herceptin q3 weekly and complete total course of 52 weeks. Also explained that once radiation therapy is done, she will need to take adjuvant homornal therapy given ER/PR positive disease.  2. Peripheral Neuropathy  - have advised to try and take gabapentin 400 mg 3 times a day and monitor.   3. Bilateral upper extremity edema - better. On furosemide + potassium supplement prn. 4. In between visits, patient advised to call or come to the ER  in case of fevers, bleeding, acute sickness or new symptoms. She is agreeable to above plan.  [name], MD   06/22/2015 3:30 PM

## 2015-06-24 DIAGNOSIS — Z510 Encounter for antineoplastic radiation therapy: Secondary | ICD-10-CM | POA: Diagnosis not present

## 2015-06-25 ENCOUNTER — Ambulatory Visit
Admission: RE | Admit: 2015-06-25 | Discharge: 2015-06-25 | Disposition: A | Discharge status: Home / Self Care | Payer: BLUE CROSS/BLUE SHIELD | Source: Ambulatory Visit | Attending: Radiation Oncology | Admitting: Radiation Oncology

## 2015-06-25 ENCOUNTER — Ambulatory Visit: Admission: RE | Admit: 2015-06-25 | Payer: BLUE CROSS/BLUE SHIELD | Source: Ambulatory Visit

## 2015-06-26 ENCOUNTER — Ambulatory Visit: Payer: BLUE CROSS/BLUE SHIELD

## 2015-06-27 ENCOUNTER — Inpatient Hospital Stay: Payer: BLUE CROSS/BLUE SHIELD

## 2015-06-27 ENCOUNTER — Ambulatory Visit: Payer: BLUE CROSS/BLUE SHIELD

## 2015-06-28 ENCOUNTER — Ambulatory Visit: Payer: BLUE CROSS/BLUE SHIELD

## 2015-07-01 ENCOUNTER — Ambulatory Visit: Payer: BLUE CROSS/BLUE SHIELD

## 2015-07-01 ENCOUNTER — Ambulatory Visit
Admission: RE | Admit: 2015-07-01 | Discharge: 2015-07-01 | Disposition: A | Discharge status: Home / Self Care | Payer: BLUE CROSS/BLUE SHIELD | Source: Ambulatory Visit | Attending: Radiation Oncology | Admitting: Radiation Oncology

## 2015-07-01 DIAGNOSIS — Z510 Encounter for antineoplastic radiation therapy: Secondary | ICD-10-CM | POA: Diagnosis not present

## 2015-07-02 ENCOUNTER — Ambulatory Visit
Admission: RE | Admit: 2015-07-02 | Discharge: 2015-07-02 | Disposition: A | Discharge status: Home / Self Care | Payer: BLUE CROSS/BLUE SHIELD | Source: Ambulatory Visit | Attending: Radiation Oncology | Admitting: Radiation Oncology

## 2015-07-02 DIAGNOSIS — Z510 Encounter for antineoplastic radiation therapy: Secondary | ICD-10-CM | POA: Diagnosis not present

## 2015-07-03 ENCOUNTER — Other Ambulatory Visit: Payer: Self-pay | Admitting: Internal Medicine

## 2015-07-03 ENCOUNTER — Ambulatory Visit
Admission: RE | Admit: 2015-07-03 | Discharge: 2015-07-03 | Disposition: A | Discharge status: Home / Self Care | Payer: BLUE CROSS/BLUE SHIELD | Source: Ambulatory Visit | Attending: Radiation Oncology | Admitting: Radiation Oncology

## 2015-07-03 DIAGNOSIS — Z510 Encounter for antineoplastic radiation therapy: Secondary | ICD-10-CM | POA: Diagnosis not present

## 2015-07-04 ENCOUNTER — Inpatient Hospital Stay: Payer: BLUE CROSS/BLUE SHIELD | Attending: Internal Medicine

## 2015-07-04 ENCOUNTER — Ambulatory Visit
Admission: RE | Admit: 2015-07-04 | Discharge: 2015-07-04 | Disposition: A | Discharge status: Home / Self Care | Payer: BLUE CROSS/BLUE SHIELD | Source: Ambulatory Visit | Attending: Radiation Oncology | Admitting: Radiation Oncology

## 2015-07-04 ENCOUNTER — Inpatient Hospital Stay: Payer: BLUE CROSS/BLUE SHIELD

## 2015-07-04 VITALS — BP 115/74 | HR 73 | Temp 96.4°F | Resp 18

## 2015-07-04 DIAGNOSIS — C773 Secondary and unspecified malignant neoplasm of axilla and upper limb lymph nodes: Secondary | ICD-10-CM | POA: Insufficient documentation

## 2015-07-04 DIAGNOSIS — C50412 Malignant neoplasm of upper-outer quadrant of left female breast: Secondary | ICD-10-CM

## 2015-07-04 DIAGNOSIS — Z510 Encounter for antineoplastic radiation therapy: Secondary | ICD-10-CM | POA: Diagnosis not present

## 2015-07-04 DIAGNOSIS — Z5112 Encounter for antineoplastic immunotherapy: Secondary | ICD-10-CM | POA: Diagnosis not present

## 2015-07-04 DIAGNOSIS — Z170 Estrogen receptor positive status [ER+]: Secondary | ICD-10-CM | POA: Insufficient documentation

## 2015-07-04 DIAGNOSIS — C50912 Malignant neoplasm of unspecified site of left female breast: Secondary | ICD-10-CM

## 2015-07-04 LAB — CBC
HEMATOCRIT: 37.9 % (ref 35.0–47.0)
HEMOGLOBIN: 12.6 g/dL (ref 12.0–16.0)
MCH: 30 pg (ref 26.0–34.0)
MCHC: 33.1 g/dL (ref 32.0–36.0)
MCV: 90.7 fL (ref 80.0–100.0)
Platelets: 242 10*3/uL (ref 150–440)
RBC: 4.18 MIL/uL (ref 3.80–5.20)
RDW: 16 % — ABNORMAL HIGH (ref 11.5–14.5)
WBC: 4.4 10*3/uL (ref 3.6–11.0)

## 2015-07-04 MED ORDER — DIPHENHYDRAMINE HCL 25 MG PO CAPS
50.0000 mg | ORAL_CAPSULE | Freq: Once | ORAL | Status: AC
  Administered 2015-07-04: 50 mg via ORAL
  Filled 2015-07-04: qty 2

## 2015-07-04 MED ORDER — TRASTUZUMAB CHEMO INJECTION 440 MG: 6.0000 mg/kg | Freq: Once | INTRAVENOUS | Status: DC

## 2015-07-04 MED ORDER — SODIUM CHLORIDE 0.9 % IV SOLN
Freq: Once | INTRAVENOUS | Status: AC
  Administered 2015-07-04: 14:00:00 via INTRAVENOUS
  Filled 2015-07-04: qty 1000

## 2015-07-04 MED ORDER — HEPARIN SOD (PORK) LOCK FLUSH 100 UNIT/ML IV SOLN
500.0000 [IU] | Freq: Once | INTRAVENOUS | Status: AC
  Administered 2015-07-04: 500 [IU] via INTRAVENOUS
  Filled 2015-07-04: qty 5

## 2015-07-04 MED ORDER — ACETAMINOPHEN 325 MG PO TABS
650.0000 mg | ORAL_TABLET | Freq: Once | ORAL | Status: AC
  Administered 2015-07-04: 650 mg via ORAL
  Filled 2015-07-04: qty 2

## 2015-07-04 MED ORDER — TRASTUZUMAB CHEMO INJECTION 440 MG: 440.0000 mg | Freq: Once | INTRAVENOUS | Status: DC

## 2015-07-04 MED ORDER — SODIUM CHLORIDE 0.9 % IJ SOLN
10.0000 mL | INTRAMUSCULAR | Status: DC | PRN
  Administered 2015-07-04: 10 mL via INTRAVENOUS
  Filled 2015-07-04: qty 10

## 2015-07-04 MED ORDER — TRASTUZUMAB CHEMO INJECTION 440 MG
440.0000 mg | Freq: Once | INTRAVENOUS | Status: AC
  Administered 2015-07-04: 440 mg via INTRAVENOUS
  Filled 2015-07-04: qty 20.95

## 2015-07-05 ENCOUNTER — Ambulatory Visit
Admission: RE | Admit: 2015-07-05 | Discharge: 2015-07-05 | Disposition: A | Discharge status: Home / Self Care | Payer: BLUE CROSS/BLUE SHIELD | Source: Ambulatory Visit | Attending: Radiation Oncology | Admitting: Radiation Oncology

## 2015-07-05 DIAGNOSIS — Z510 Encounter for antineoplastic radiation therapy: Secondary | ICD-10-CM | POA: Diagnosis not present

## 2015-07-08 ENCOUNTER — Other Ambulatory Visit: Payer: Self-pay | Admitting: *Deleted

## 2015-07-08 ENCOUNTER — Ambulatory Visit
Admission: RE | Admit: 2015-07-08 | Discharge: 2015-07-08 | Disposition: A | Discharge status: Home / Self Care | Payer: BLUE CROSS/BLUE SHIELD | Source: Ambulatory Visit | Attending: Radiation Oncology | Admitting: Radiation Oncology

## 2015-07-08 DIAGNOSIS — C50412 Malignant neoplasm of upper-outer quadrant of left female breast: Secondary | ICD-10-CM

## 2015-07-08 DIAGNOSIS — Z510 Encounter for antineoplastic radiation therapy: Secondary | ICD-10-CM | POA: Diagnosis not present

## 2015-07-08 MED ORDER — FUROSEMIDE 20 MG PO TABS: ORAL_TABLET | ORAL | Status: DC

## 2015-07-09 ENCOUNTER — Ambulatory Visit
Admission: RE | Admit: 2015-07-09 | Discharge: 2015-07-09 | Disposition: A | Discharge status: Home / Self Care | Payer: BLUE CROSS/BLUE SHIELD | Source: Ambulatory Visit | Attending: Radiation Oncology | Admitting: Radiation Oncology

## 2015-07-09 DIAGNOSIS — Z510 Encounter for antineoplastic radiation therapy: Secondary | ICD-10-CM | POA: Diagnosis not present

## 2015-07-10 ENCOUNTER — Encounter: Payer: Self-pay | Admitting: General Surgery

## 2015-07-10 ENCOUNTER — Inpatient Hospital Stay: Payer: BLUE CROSS/BLUE SHIELD

## 2015-07-10 ENCOUNTER — Ambulatory Visit
Admission: RE | Admit: 2015-07-10 | Discharge: 2015-07-10 | Disposition: A | Discharge status: Home / Self Care | Payer: BLUE CROSS/BLUE SHIELD | Source: Ambulatory Visit | Attending: Radiation Oncology | Admitting: Radiation Oncology

## 2015-07-10 ENCOUNTER — Ambulatory Visit (INDEPENDENT_AMBULATORY_CARE_PROVIDER_SITE_OTHER): Payer: BLUE CROSS/BLUE SHIELD | Admitting: General Surgery

## 2015-07-10 ENCOUNTER — Other Ambulatory Visit: Payer: BLUE CROSS/BLUE SHIELD

## 2015-07-10 VITALS — BP 108/60 | HR 78 | Resp 13 | Ht 66.0 in | Wt 153.0 lb

## 2015-07-10 DIAGNOSIS — C50912 Malignant neoplasm of unspecified site of left female breast: Secondary | ICD-10-CM | POA: Diagnosis not present

## 2015-07-10 DIAGNOSIS — N6489 Other specified disorders of breast: Secondary | ICD-10-CM

## 2015-07-10 DIAGNOSIS — C50412 Malignant neoplasm of upper-outer quadrant of left female breast: Secondary | ICD-10-CM | POA: Diagnosis not present

## 2015-07-10 DIAGNOSIS — Z510 Encounter for antineoplastic radiation therapy: Secondary | ICD-10-CM | POA: Diagnosis not present

## 2015-07-10 LAB — CBC
HEMATOCRIT: 39.6 % (ref 35.0–47.0)
Hemoglobin: 13.2 g/dL (ref 12.0–16.0)
MCH: 29.7 pg (ref 26.0–34.0)
MCHC: 33.3 g/dL (ref 32.0–36.0)
MCV: 89.2 fL (ref 80.0–100.0)
PLATELETS: 231 10*3/uL (ref 150–440)
RBC: 4.44 MIL/uL (ref 3.80–5.20)
RDW: 15.6 % — AB (ref 11.5–14.5)
WBC: 3.9 10*3/uL (ref 3.6–11.0)

## 2015-07-10 NOTE — Progress Notes (Signed)
Patient ID: Sarah Horne, female   DOB: 1955-12-12, 59 y.o.   MRN: [id number]  Chief Complaint  Patient presents with  . Routine Post Op    left breast wide excision    HPI Sarah Horne is a 59 y.o. female here for a follow up of left breast wide excision done on 05/23/2015. She reports that she was on Keflex on 06/18/15 by Dr Baruch Gouty for a possible infection in the breast. This has fully resolved. She reports no new breast problems.  HPI  Past Medical History  Diagnosis Date  . Hypertension   . Hyperlipidemia   . Heart attack ([place]) 1998  . Cancer ([place]) 2016    Stage II, ER positive, PR positive, HER-2/neu overexpressing of the left breast.  . Neuropathy [facility])     Past Surgical History  Procedure Laterality Date  . Portacath placement Right 12-31-14    Dr Bary Castilla  . Breast lumpectomy with sentinel lymph node biopsy Left 05/23/2015    Procedure: LEFT BREAST WIDE EXCISION WITH AXILLARY DISSECTION, MASTOPLASTY ;  Surgeon: [name], MD;  Location: [facility] ORS;  Service: General;  Laterality: Left;  . Breast surgery Left 12/18/14    breast biopsy/INVASIVE DUCTAL CARCINOMA OF BREAST, NOTTINGHAM GRADE 2.  . Breast surgery  05/23/2015.    Wide excision/mastoplasty, axillary dissection. No residual invasive cancer, positive for residual DCIS. 0/2 nodes identified on axillary dissection. (no SLN by technetium or methylene blue)    Family History  Problem Relation Age of Onset  . Breast cancer Maternal Aunt   . Breast cancer Cousin   . Brain cancer Maternal Uncle     Social History Social History  Substance Use Topics  . Smoking status: Current Every Day Smoker -- 0.50 packs/day for 18 years    Types: Cigarettes  . Smokeless tobacco: None  . Alcohol Use: No    Allergies  Allergen Reactions  . No Known Allergies     Current Outpatient Prescriptions  Medication Sig Dispense Refill  . aspirin 81 MG tablet Take 81 mg by mouth daily.    Marland Kitchen atenolol (TENORMIN) 25 MG  tablet Take 1 tablet (25 mg total) by mouth daily. (Patient taking differently: Take 25 mg by mouth every morning. ) 14 tablet 0  . furosemide (LASIX) 20 MG tablet Take 1 tablet orally once daily as needed for swelling. 30 tablet 0  . gabapentin (NEURONTIN) 100 MG capsule Take 2 capsules (200 mg total) by mouth 3 (three) times daily. (Patient taking differently: Take 200 mg by mouth 3 (three) times daily. ) 180 capsule 3  . lisinopril (PRINIVIL,ZESTRIL) 10 MG tablet Take 1 tablet (10 mg total) by mouth daily. (Patient taking differently: Take 10 mg by mouth every morning. ) 14 tablet 0  . simvastatin (ZOCOR) 40 MG tablet Take 1 tablet (40 mg total) by mouth daily at 6 PM. 14 tablet 0   No current facility-administered medications for this visit.   Facility-Administered Medications Ordered in Other Visits  Medication Dose Route Frequency Provider Last Rate Last Dose  . 0.9 %  sodium chloride infusion   Intravenous Continuous [name], MD      . 0.9 %  sodium chloride infusion   Intravenous Continuous Lloyd Huger, MD 999 mL/hr at 04/24/15 1520    . famotidine (PEPCID) IVPB 20 mg  20 mg Intravenous Q12H [name], MD   20 mg at 01/29/15 1230  . sodium chloride 0.45 % 1,000 mL infusion   Intravenous  Continuous [name], MD 500 mL/hr at 04/04/15 1444    . sodium chloride 0.9 % injection 10 mL  10 mL Intravenous PRN [name], MD   10 mL at 04/04/15 1440    Review of Systems Review of Systems  Constitutional: Negative.   Respiratory: Negative.   Cardiovascular: Negative.     Blood pressure 108/60, pulse 78, resp. rate 13, height _0  (1.676 m), weight 153 lb (69.4 kg).  Physical Exam Physical Exam  Pulmonary/Chest:      Data Reviewed Ultrasound examination showed a heterogeneous mass with posterior acoustic enhancement measuring 1.7 x 3.1 x 5.0 cm. This was consistent with a hematoma.  Aspiration yielded 5 mL of old blood. This was completed after 1 mL of  1% plain Xylocaine. Chlor prep.  The patient was amenable to formal incision and drainage to accelerate healing and to minimize over/under treatment with ongoing radiation treatment. A small less than 1 cm incision was made in the lateral aspect of the wide excision site. A hemostat was used to break up loculations and old clot removed. The area was irrigated with saline. Defect closed with benzoin and Steri-Strips followed by dry gauze dressing. Procedure was well tolerated.  Assessment    Hematoma status post wide excision.    Plan    The patient may continue her ongoing radiation therapy. She'll follow up with the nurse in one week, physician examine 2 weeks.    Nurse visit in  6 days.  PCP: Raynelle Chary 07/10/2015, 9:14 PM

## 2015-07-10 NOTE — Patient Instructions (Signed)
Follow up in 6 days. You may shower tomorrow.

## 2015-07-11 ENCOUNTER — Ambulatory Visit
Admission: RE | Admit: 2015-07-11 | Discharge: 2015-07-11 | Disposition: A | Discharge status: Home / Self Care | Payer: BLUE CROSS/BLUE SHIELD | Source: Ambulatory Visit | Attending: Radiation Oncology | Admitting: Radiation Oncology

## 2015-07-11 ENCOUNTER — Other Ambulatory Visit: Payer: Self-pay | Admitting: *Deleted

## 2015-07-11 DIAGNOSIS — C50912 Malignant neoplasm of unspecified site of left female breast: Secondary | ICD-10-CM

## 2015-07-11 DIAGNOSIS — Z79899 Other long term (current) drug therapy: Secondary | ICD-10-CM

## 2015-07-11 DIAGNOSIS — Z510 Encounter for antineoplastic radiation therapy: Secondary | ICD-10-CM | POA: Diagnosis not present

## 2015-07-12 ENCOUNTER — Ambulatory Visit
Admission: RE | Admit: 2015-07-12 | Discharge: 2015-07-12 | Disposition: A | Discharge status: Home / Self Care | Payer: BLUE CROSS/BLUE SHIELD | Source: Ambulatory Visit | Attending: Radiation Oncology | Admitting: Radiation Oncology

## 2015-07-12 DIAGNOSIS — Z510 Encounter for antineoplastic radiation therapy: Secondary | ICD-10-CM | POA: Diagnosis not present

## 2015-07-15 ENCOUNTER — Ambulatory Visit
Admission: RE | Admit: 2015-07-15 | Discharge: 2015-07-15 | Disposition: A | Discharge status: Home / Self Care | Payer: BLUE CROSS/BLUE SHIELD | Source: Ambulatory Visit | Attending: Radiation Oncology | Admitting: Radiation Oncology

## 2015-07-15 DIAGNOSIS — Z510 Encounter for antineoplastic radiation therapy: Secondary | ICD-10-CM | POA: Diagnosis not present

## 2015-07-16 ENCOUNTER — Ambulatory Visit
Admission: RE | Admit: 2015-07-16 | Discharge: 2015-07-16 | Disposition: A | Discharge status: Home / Self Care | Payer: BLUE CROSS/BLUE SHIELD | Source: Ambulatory Visit | Attending: Radiation Oncology | Admitting: Radiation Oncology

## 2015-07-16 DIAGNOSIS — Z510 Encounter for antineoplastic radiation therapy: Secondary | ICD-10-CM | POA: Diagnosis not present

## 2015-07-17 ENCOUNTER — Ambulatory Visit
Admission: RE | Admit: 2015-07-17 | Discharge: 2015-07-17 | Disposition: A | Discharge status: Home / Self Care | Payer: BLUE CROSS/BLUE SHIELD | Source: Ambulatory Visit | Attending: Radiation Oncology | Admitting: Radiation Oncology

## 2015-07-17 ENCOUNTER — Ambulatory Visit: Payer: BLUE CROSS/BLUE SHIELD

## 2015-07-17 DIAGNOSIS — Z510 Encounter for antineoplastic radiation therapy: Secondary | ICD-10-CM | POA: Diagnosis not present

## 2015-07-18 ENCOUNTER — Ambulatory Visit (INDEPENDENT_AMBULATORY_CARE_PROVIDER_SITE_OTHER): Payer: BLUE CROSS/BLUE SHIELD | Admitting: *Deleted

## 2015-07-18 ENCOUNTER — Ambulatory Visit: Payer: BLUE CROSS/BLUE SHIELD

## 2015-07-18 ENCOUNTER — Encounter: Payer: Self-pay | Admitting: Family Medicine

## 2015-07-18 ENCOUNTER — Ambulatory Visit
Admission: RE | Admit: 2015-07-18 | Discharge: 2015-07-18 | Disposition: A | Discharge status: Home / Self Care | Payer: BLUE CROSS/BLUE SHIELD | Source: Ambulatory Visit | Attending: Radiation Oncology | Admitting: Radiation Oncology

## 2015-07-18 ENCOUNTER — Ambulatory Visit (INDEPENDENT_AMBULATORY_CARE_PROVIDER_SITE_OTHER): Payer: BLUE CROSS/BLUE SHIELD | Admitting: Family Medicine

## 2015-07-18 VITALS — BP 110/73 | HR 153 | Temp 98.7°F | Resp 21 | Ht 66.0 in | Wt 151.2 lb

## 2015-07-18 DIAGNOSIS — C50912 Malignant neoplasm of unspecified site of left female breast: Secondary | ICD-10-CM

## 2015-07-18 DIAGNOSIS — I10 Essential (primary) hypertension: Secondary | ICD-10-CM | POA: Insufficient documentation

## 2015-07-18 DIAGNOSIS — Z510 Encounter for antineoplastic radiation therapy: Secondary | ICD-10-CM | POA: Diagnosis not present

## 2015-07-18 MED ORDER — LISINOPRIL 10 MG PO TABS: 10.0000 mg | ORAL_TABLET | Freq: Every day | ORAL | Status: DC

## 2015-07-18 MED ORDER — ATENOLOL 25 MG PO TABS: 25.0000 mg | ORAL_TABLET | Freq: Every day | ORAL | Status: DC

## 2015-07-18 NOTE — Patient Instructions (Addendum)
The patient is aware to call back for any questions or concerns. Heating pad for comfort to the area.

## 2015-07-18 NOTE — Progress Notes (Signed)
Name: Sarah Horne   MRN: [id number]    DOB: Jan 10, 1956   Date:07/18/2015       Progress Note  Subjective  Chief Complaint  Chief Complaint  Patient presents with  . Medication Refill  . Hypertension    Hypertension This is a chronic problem. The problem is controlled. Pertinent negatives include no blurred vision, chest pain, headaches, palpitations or shortness of breath. Past treatments include beta blockers and ACE inhibitors. Hypertensive end-organ damage includes CAD/MI. There is no history of kidney disease or CVA.   Patient requesting refill for atenolol and lisinopril, taken for hypertension. Blood pressure is well controlled.   Past Medical History  Diagnosis Date  . Hypertension   . Hyperlipidemia   . Heart attack ([place]) 1998  . Cancer ([place]) 2016    Stage II, ER positive, PR positive, HER-2/neu overexpressing of the left breast.  . Neuropathy [facility])     Past Surgical History  Procedure Laterality Date  . Portacath placement Right 12-31-14    Dr Bary Castilla  . Breast lumpectomy with sentinel lymph node biopsy Left 05/23/2015    Procedure: LEFT BREAST WIDE EXCISION WITH AXILLARY DISSECTION, MASTOPLASTY ;  Surgeon: [name], MD;  Location: [facility] ORS;  Service: General;  Laterality: Left;  . Breast surgery Left 12/18/14    breast biopsy/INVASIVE DUCTAL CARCINOMA OF BREAST, NOTTINGHAM GRADE 2.  . Breast surgery  05/23/2015.    Wide excision/mastoplasty, axillary dissection. No residual invasive cancer, positive for residual DCIS. 0/2 nodes identified on axillary dissection. (no SLN by technetium or methylene blue)    Family History  Problem Relation Age of Onset  . Breast cancer Maternal Aunt   . Breast cancer Cousin   . Brain cancer Maternal Uncle     Social History   Social History  . Marital Status: Divorced    Spouse Name: N/A  . Number of Children: N/A  . Years of Education: N/A   Occupational History  . Not on file.   Social History Main Topics   . Smoking status: Current Every Day Smoker -- 0.50 packs/day for 18 years    Types: Cigarettes  . Smokeless tobacco: Not on file  . Alcohol Use: No  . Drug Use: No  . Sexual Activity: Not on file   Other Topics Concern  . Not on file   Social History Narrative     Current outpatient prescriptions:  .  aspirin 81 MG tablet, Take 81 mg by mouth daily., Disp: , Rfl:  .  atenolol (TENORMIN) 25 MG tablet, Take 1 tablet (25 mg total) by mouth daily. (Patient taking differently: Take 25 mg by mouth every morning. ), Disp: 14 tablet, Rfl: 0 .  furosemide (LASIX) 20 MG tablet, Take 1 tablet orally once daily as needed for swelling., Disp: 30 tablet, Rfl: 0 .  gabapentin (NEURONTIN) 100 MG capsule, Take 2 capsules (200 mg total) by mouth 3 (three) times daily. (Patient taking differently: Take 200 mg by mouth 3 (three) times daily. ), Disp: 180 capsule, Rfl: 3 .  lisinopril (PRINIVIL,ZESTRIL) 10 MG tablet, Take 1 tablet (10 mg total) by mouth daily. (Patient taking differently: Take 10 mg by mouth every morning. ), Disp: 14 tablet, Rfl: 0 .  simvastatin (ZOCOR) 40 MG tablet, Take 1 tablet (40 mg total) by mouth daily at 6 PM., Disp: 14 tablet, Rfl: 0 No current facility-administered medications for this visit.  Facility-Administered Medications Ordered in Other Visits:  .  0.9 %  sodium chloride infusion, ,  Intravenous, Continuous, [name], MD .  0.9 %  sodium chloride infusion, , Intravenous, Continuous, Lloyd Huger, MD, Last Rate: 999 mL/hr at 04/24/15 1520 .  famotidine (PEPCID) IVPB 20 mg, 20 mg, Intravenous, Q12H, [name], MD, 20 mg at 01/29/15 1230 .  sodium chloride 0.45 % 1,000 mL infusion, , Intravenous, Continuous, [name], MD, Last Rate: 500 mL/hr at 04/04/15 1444 .  sodium chloride 0.9 % injection 10 mL, 10 mL, Intravenous, PRN, [name], MD, 10 mL at 04/04/15 1440  Allergies  Allergen Reactions  . No Known Allergies    Review of Systems   Constitutional: Negative for fever and chills.  Eyes: Negative for blurred vision and double vision.  Respiratory: Negative for shortness of breath.   Cardiovascular: Negative for chest pain and palpitations.  Neurological: Negative for headaches.    Objective  Filed Vitals:   07/18/15 1556  BP: 110/73  Pulse: 153  Temp: 98.7 F (37.1 C)  TempSrc: Oral  Resp: 21  Height: _0  (1.676 m)  Weight: 151 lb 3.2 oz (68.584 kg)  SpO2: 97%    Physical Exam  Constitutional: She is oriented to person, place, and time and well-developed, well-nourished, and in no distress.  HENT:  Head: Normocephalic and atraumatic.  Eyes: Conjunctivae are normal. Pupils are equal, round, and reactive to light.  Cardiovascular: Normal rate, regular rhythm and normal heart sounds.   No murmur heard. Pulmonary/Chest: Breath sounds normal. No respiratory distress. She has no wheezes.  Neurological: She is alert and oriented to person, place, and time.  Skin: Skin is warm.  Nursing note and vitals reviewed.   Assessment & Plan  1. Essential hypertension  - atenolol (TENORMIN) 25 MG tablet; Take 1 tablet (25 mg total) by mouth daily.  Dispense: 90 tablet; Refill: 0 - lisinopril (PRINIVIL,ZESTRIL) 10 MG tablet; Take 1 tablet (10 mg total) by mouth daily.  Dispense: 90 tablet; Refill: 0   Alicya Bena Asad A. [facility] 07/18/2015 4:53 PM

## 2015-07-18 NOTE — Progress Notes (Signed)
Patient came in today for a wound check/hematoma status post wide excision.  The wound is clean, with no signs of infection noted. Hard firm area noted laterally, pt states unchanged from before. Follow up as scheduled.

## 2015-07-19 ENCOUNTER — Ambulatory Visit
Admission: RE | Admit: 2015-07-19 | Discharge: 2015-07-19 | Disposition: A | Discharge status: Home / Self Care | Payer: BLUE CROSS/BLUE SHIELD | Source: Ambulatory Visit | Attending: Radiation Oncology | Admitting: Radiation Oncology

## 2015-07-19 DIAGNOSIS — Z510 Encounter for antineoplastic radiation therapy: Secondary | ICD-10-CM | POA: Diagnosis not present

## 2015-07-22 ENCOUNTER — Encounter: Payer: Self-pay | Admitting: General Surgery

## 2015-07-22 ENCOUNTER — Ambulatory Visit
Admission: RE | Admit: 2015-07-22 | Discharge: 2015-07-22 | Disposition: A | Discharge status: Home / Self Care | Payer: BLUE CROSS/BLUE SHIELD | Source: Ambulatory Visit | Attending: Radiation Oncology | Admitting: Radiation Oncology

## 2015-07-22 ENCOUNTER — Ambulatory Visit (INDEPENDENT_AMBULATORY_CARE_PROVIDER_SITE_OTHER): Payer: BLUE CROSS/BLUE SHIELD | Admitting: General Surgery

## 2015-07-22 VITALS — BP 128/70 | HR 78 | Resp 12 | Ht 66.0 in | Wt 150.4 lb

## 2015-07-22 DIAGNOSIS — Z510 Encounter for antineoplastic radiation therapy: Secondary | ICD-10-CM | POA: Diagnosis not present

## 2015-07-22 DIAGNOSIS — C50912 Malignant neoplasm of unspecified site of left female breast: Secondary | ICD-10-CM

## 2015-07-22 NOTE — Patient Instructions (Signed)
Patient to see Dr. Nicholaus Bloom for contralateral breast pexy.

## 2015-07-22 NOTE — Progress Notes (Signed)
Patient ID: Sarah Horne, female   DOB: Jul 10, 1956, 59 y.o.   MRN: [id number]  Chief Complaint  Patient presents with  . Routine Post Op    HPI Sarah Horne is a 59 y.o. female here for a follow up of left breast wide excision done on 05/23/2015. I&D of area done on 07/10/15 to drain old blood. States that she still has a hard knot in the left breast area. No complaints of pain or discomfort.  HPI  Past Medical History  Diagnosis Date  . Hypertension   . Hyperlipidemia   . Heart attack ([place]) 1998  . Cancer ([place]) 2016    Stage II, ER positive, PR positive, HER-2/neu overexpressing of the left breast.  . Neuropathy [facility])     Past Surgical History  Procedure Laterality Date  . Portacath placement Right 12-31-14    Dr Bary Castilla  . Breast lumpectomy with sentinel lymph node biopsy Left 05/23/2015    Procedure: LEFT BREAST WIDE EXCISION WITH AXILLARY DISSECTION, MASTOPLASTY ;  Surgeon: [name], MD;  Location: [facility] ORS;  Service: General;  Laterality: Left;  . Breast surgery Left 12/18/14    breast biopsy/INVASIVE DUCTAL CARCINOMA OF BREAST, NOTTINGHAM GRADE 2.  . Breast surgery  05/23/2015.    Wide excision/mastoplasty, axillary dissection. No residual invasive cancer, positive for residual DCIS. 0/2 nodes identified on axillary dissection. (no SLN by technetium or methylene blue)    Family History  Problem Relation Age of Onset  . Breast cancer Maternal Aunt   . Breast cancer Cousin   . Brain cancer Maternal Uncle     Social History Social History  Substance Use Topics  . Smoking status: Current Every Day Smoker -- 0.50 packs/day for 18 years    Types: Cigarettes  . Smokeless tobacco: None  . Alcohol Use: No    Allergies  Allergen Reactions  . No Known Allergies     Current Outpatient Prescriptions  Medication Sig Dispense Refill  . aspirin 81 MG tablet Take 81 mg by mouth daily.    Marland Kitchen atenolol (TENORMIN) 25 MG tablet Take 1 tablet (25 mg total) by mouth  daily. 90 tablet 0  . furosemide (LASIX) 20 MG tablet Take 1 tablet orally once daily as needed for swelling. 30 tablet 0  . gabapentin (NEURONTIN) 100 MG capsule Take 2 capsules (200 mg total) by mouth 3 (three) times daily. (Patient taking differently: Take 200 mg by mouth 3 (three) times daily. ) 180 capsule 3  . lisinopril (PRINIVIL,ZESTRIL) 10 MG tablet Take 1 tablet (10 mg total) by mouth daily. 90 tablet 0  . simvastatin (ZOCOR) 40 MG tablet Take 1 tablet (40 mg total) by mouth daily at 6 PM. 14 tablet 0   No current facility-administered medications for this visit.   Facility-Administered Medications Ordered in Other Visits  Medication Dose Route Frequency Provider Last Rate Last Dose  . 0.9 %  sodium chloride infusion   Intravenous Continuous [name], MD      . 0.9 %  sodium chloride infusion   Intravenous Continuous Lloyd Huger, MD 999 mL/hr at 04/24/15 1520    . famotidine (PEPCID) IVPB 20 mg  20 mg Intravenous Q12H [name], MD   20 mg at 01/29/15 1230  . sodium chloride 0.45 % 1,000 mL infusion   Intravenous Continuous [name], MD 500 mL/hr at 04/04/15 1444    . sodium chloride 0.9 % injection 10 mL  10 mL Intravenous PRN [name], MD  10 mL at 04/04/15 1440    Review of Systems Review of Systems  Constitutional: Negative.   Respiratory: Negative.   Cardiovascular: Negative.     Blood pressure 128/70, pulse 78, resp. rate 12, height 5' 6"  (1.676 m), weight 150 lb 6.4 oz (68.221 kg).  Physical Exam Physical Exam  Constitutional: She is oriented to person, place, and time. She appears well-developed and well-nourished.  Pulmonary/Chest:    Neurological: She is alert and oriented to person, place, and time.  Skin: Skin is warm and dry.  Psychiatric: She has a normal mood and affect.      Assessment    Tolerating post wide excision and radiation therapy well.  Moderate asymmetry with ptosis of the uninvolved breast.    Plan     Patient to see Dr. Nicholaus Bloom to discuss contralateral breast mastopexy.  Physician follow-up in one month.     PCP: Raynelle Chary 07/23/2015, 11:46 AM

## 2015-07-23 ENCOUNTER — Ambulatory Visit
Admission: RE | Admit: 2015-07-23 | Discharge: 2015-07-23 | Disposition: A | Discharge status: Home / Self Care | Payer: BLUE CROSS/BLUE SHIELD | Source: Ambulatory Visit | Attending: Radiation Oncology | Admitting: Radiation Oncology

## 2015-07-23 DIAGNOSIS — Z510 Encounter for antineoplastic radiation therapy: Secondary | ICD-10-CM | POA: Diagnosis not present

## 2015-07-24 ENCOUNTER — Ambulatory Visit
Admission: RE | Admit: 2015-07-24 | Discharge: 2015-07-24 | Disposition: A | Discharge status: Home / Self Care | Payer: BLUE CROSS/BLUE SHIELD | Source: Ambulatory Visit | Attending: Radiation Oncology | Admitting: Radiation Oncology

## 2015-07-24 ENCOUNTER — Inpatient Hospital Stay: Payer: BLUE CROSS/BLUE SHIELD

## 2015-07-24 DIAGNOSIS — C50412 Malignant neoplasm of upper-outer quadrant of left female breast: Secondary | ICD-10-CM | POA: Diagnosis not present

## 2015-07-24 DIAGNOSIS — Z510 Encounter for antineoplastic radiation therapy: Secondary | ICD-10-CM | POA: Diagnosis not present

## 2015-07-24 DIAGNOSIS — C50912 Malignant neoplasm of unspecified site of left female breast: Secondary | ICD-10-CM

## 2015-07-24 LAB — CBC
HEMATOCRIT: 41.8 % (ref 35.0–47.0)
HEMOGLOBIN: 13.9 g/dL (ref 12.0–16.0)
MCH: 29.6 pg (ref 26.0–34.0)
MCHC: 33.3 g/dL (ref 32.0–36.0)
MCV: 88.8 fL (ref 80.0–100.0)
Platelets: 209 10*3/uL (ref 150–440)
RBC: 4.71 MIL/uL (ref 3.80–5.20)
RDW: 15.4 % — ABNORMAL HIGH (ref 11.5–14.5)
WBC: 3.1 10*3/uL — ABNORMAL LOW (ref 3.6–11.0)

## 2015-07-25 ENCOUNTER — Inpatient Hospital Stay: Payer: BLUE CROSS/BLUE SHIELD

## 2015-07-25 ENCOUNTER — Ambulatory Visit
Admission: RE | Admit: 2015-07-25 | Discharge: 2015-07-25 | Disposition: A | Discharge status: Home / Self Care | Payer: BLUE CROSS/BLUE SHIELD | Source: Ambulatory Visit | Attending: Radiation Oncology | Admitting: Radiation Oncology

## 2015-07-25 VITALS — BP 110/71 | HR 101 | Temp 97.4°F | Resp 18

## 2015-07-25 DIAGNOSIS — C50412 Malignant neoplasm of upper-outer quadrant of left female breast: Secondary | ICD-10-CM | POA: Diagnosis not present

## 2015-07-25 DIAGNOSIS — Z510 Encounter for antineoplastic radiation therapy: Secondary | ICD-10-CM | POA: Diagnosis not present

## 2015-07-25 MED ORDER — SODIUM CHLORIDE 0.9 % IJ SOLN
10.0000 mL | INTRAMUSCULAR | Status: DC | PRN
  Administered 2015-07-25: 10 mL
  Filled 2015-07-25: qty 10

## 2015-07-25 MED ORDER — DIPHENHYDRAMINE HCL 25 MG PO CAPS
50.0000 mg | ORAL_CAPSULE | Freq: Once | ORAL | Status: AC
  Administered 2015-07-25: 50 mg via ORAL

## 2015-07-25 MED ORDER — SODIUM CHLORIDE 0.9 % IV SOLN
Freq: Once | INTRAVENOUS | Status: AC
  Administered 2015-07-25: 15:00:00 via INTRAVENOUS
  Filled 2015-07-25: qty 1000

## 2015-07-25 MED ORDER — HEPARIN SOD (PORK) LOCK FLUSH 100 UNIT/ML IV SOLN
500.0000 [IU] | Freq: Once | INTRAVENOUS | Status: AC | PRN
  Administered 2015-07-25: 500 [IU]
  Filled 2015-07-25: qty 5

## 2015-07-25 MED ORDER — ACETAMINOPHEN 325 MG PO TABS
650.0000 mg | ORAL_TABLET | Freq: Once | ORAL | Status: AC
  Administered 2015-07-25: 650 mg via ORAL
  Filled 2015-07-25: qty 2

## 2015-07-25 MED ORDER — TRASTUZUMAB CHEMO INJECTION 440 MG
440.0000 mg | Freq: Once | INTRAVENOUS | Status: AC
  Administered 2015-07-25: 440 mg via INTRAVENOUS
  Filled 2015-07-25: qty 20.95

## 2015-07-26 ENCOUNTER — Ambulatory Visit
Admission: RE | Admit: 2015-07-26 | Discharge: 2015-07-26 | Disposition: A | Discharge status: Home / Self Care | Payer: BLUE CROSS/BLUE SHIELD | Source: Ambulatory Visit | Attending: Radiation Oncology | Admitting: Radiation Oncology

## 2015-07-26 DIAGNOSIS — Z510 Encounter for antineoplastic radiation therapy: Secondary | ICD-10-CM | POA: Diagnosis not present

## 2015-07-28 ENCOUNTER — Ambulatory Visit
Admission: RE | Admit: 2015-07-28 | Discharge: 2015-07-28 | Disposition: A | Discharge status: Home / Self Care | Payer: BLUE CROSS/BLUE SHIELD | Source: Ambulatory Visit | Attending: Radiation Oncology | Admitting: Radiation Oncology

## 2015-07-29 ENCOUNTER — Ambulatory Visit
Admission: RE | Admit: 2015-07-29 | Discharge: 2015-07-29 | Disposition: A | Discharge status: Home / Self Care | Payer: BLUE CROSS/BLUE SHIELD | Source: Ambulatory Visit | Attending: Radiation Oncology | Admitting: Radiation Oncology

## 2015-07-29 DIAGNOSIS — Z510 Encounter for antineoplastic radiation therapy: Secondary | ICD-10-CM | POA: Diagnosis not present

## 2015-07-30 ENCOUNTER — Ambulatory Visit
Admission: RE | Admit: 2015-07-30 | Discharge: 2015-07-30 | Disposition: A | Discharge status: Home / Self Care | Payer: BLUE CROSS/BLUE SHIELD | Source: Ambulatory Visit | Attending: Radiation Oncology | Admitting: Radiation Oncology

## 2015-07-30 DIAGNOSIS — Z510 Encounter for antineoplastic radiation therapy: Secondary | ICD-10-CM | POA: Diagnosis not present

## 2015-07-31 ENCOUNTER — Ambulatory Visit: Payer: BLUE CROSS/BLUE SHIELD

## 2015-07-31 ENCOUNTER — Ambulatory Visit
Admission: RE | Admit: 2015-07-31 | Discharge: 2015-07-31 | Disposition: A | Discharge status: Home / Self Care | Payer: BLUE CROSS/BLUE SHIELD | Source: Ambulatory Visit | Attending: Radiation Oncology | Admitting: Radiation Oncology

## 2015-07-31 DIAGNOSIS — Z510 Encounter for antineoplastic radiation therapy: Secondary | ICD-10-CM | POA: Diagnosis not present

## 2015-08-01 ENCOUNTER — Ambulatory Visit
Admission: RE | Admit: 2015-08-01 | Discharge: 2015-08-01 | Disposition: A | Discharge status: Home / Self Care | Payer: BLUE CROSS/BLUE SHIELD | Source: Ambulatory Visit | Attending: Radiation Oncology | Admitting: Radiation Oncology

## 2015-08-01 DIAGNOSIS — Z510 Encounter for antineoplastic radiation therapy: Secondary | ICD-10-CM | POA: Diagnosis not present

## 2015-08-02 ENCOUNTER — Ambulatory Visit
Admission: RE | Admit: 2015-08-02 | Discharge: 2015-08-02 | Disposition: A | Discharge status: Home / Self Care | Payer: BLUE CROSS/BLUE SHIELD | Source: Ambulatory Visit | Attending: Radiation Oncology | Admitting: Radiation Oncology

## 2015-08-02 DIAGNOSIS — Z510 Encounter for antineoplastic radiation therapy: Secondary | ICD-10-CM | POA: Diagnosis not present

## 2015-08-05 ENCOUNTER — Ambulatory Visit
Admission: RE | Admit: 2015-08-05 | Discharge: 2015-08-05 | Disposition: A | Discharge status: Home / Self Care | Payer: BLUE CROSS/BLUE SHIELD | Source: Ambulatory Visit | Attending: Radiation Oncology | Admitting: Radiation Oncology

## 2015-08-05 ENCOUNTER — Ambulatory Visit: Payer: BLUE CROSS/BLUE SHIELD

## 2015-08-05 DIAGNOSIS — Z510 Encounter for antineoplastic radiation therapy: Secondary | ICD-10-CM | POA: Diagnosis not present

## 2015-08-06 ENCOUNTER — Ambulatory Visit
Admission: RE | Admit: 2015-08-06 | Discharge: 2015-08-06 | Disposition: A | Discharge status: Home / Self Care | Payer: BLUE CROSS/BLUE SHIELD | Source: Ambulatory Visit | Attending: Radiation Oncology | Admitting: Radiation Oncology

## 2015-08-06 ENCOUNTER — Ambulatory Visit: Payer: BLUE CROSS/BLUE SHIELD

## 2015-08-06 DIAGNOSIS — Z510 Encounter for antineoplastic radiation therapy: Secondary | ICD-10-CM | POA: Diagnosis not present

## 2015-08-07 ENCOUNTER — Ambulatory Visit
Admission: RE | Admit: 2015-08-07 | Discharge: 2015-08-07 | Disposition: A | Discharge status: Home / Self Care | Payer: BLUE CROSS/BLUE SHIELD | Source: Ambulatory Visit | Attending: Radiation Oncology | Admitting: Radiation Oncology

## 2015-08-07 ENCOUNTER — Inpatient Hospital Stay: Payer: BLUE CROSS/BLUE SHIELD | Attending: Internal Medicine | Admitting: *Deleted

## 2015-08-07 ENCOUNTER — Ambulatory Visit: Payer: BLUE CROSS/BLUE SHIELD

## 2015-08-07 DIAGNOSIS — IMO0002 Reserved for concepts with insufficient information to code with codable children: Secondary | ICD-10-CM

## 2015-08-07 DIAGNOSIS — C773 Secondary and unspecified malignant neoplasm of axilla and upper limb lymph nodes: Secondary | ICD-10-CM | POA: Diagnosis not present

## 2015-08-07 DIAGNOSIS — E785 Hyperlipidemia, unspecified: Secondary | ICD-10-CM | POA: Insufficient documentation

## 2015-08-07 DIAGNOSIS — Z5112 Encounter for antineoplastic immunotherapy: Secondary | ICD-10-CM | POA: Diagnosis not present

## 2015-08-07 DIAGNOSIS — Z170 Estrogen receptor positive status [ER+]: Secondary | ICD-10-CM | POA: Insufficient documentation

## 2015-08-07 DIAGNOSIS — G620 Drug-induced polyneuropathy: Secondary | ICD-10-CM | POA: Insufficient documentation

## 2015-08-07 DIAGNOSIS — Z9221 Personal history of antineoplastic chemotherapy: Secondary | ICD-10-CM | POA: Diagnosis not present

## 2015-08-07 DIAGNOSIS — Z510 Encounter for antineoplastic radiation therapy: Secondary | ICD-10-CM | POA: Diagnosis not present

## 2015-08-07 DIAGNOSIS — M7989 Other specified soft tissue disorders: Secondary | ICD-10-CM | POA: Diagnosis not present

## 2015-08-07 DIAGNOSIS — I10 Essential (primary) hypertension: Secondary | ICD-10-CM | POA: Diagnosis not present

## 2015-08-07 DIAGNOSIS — Z803 Family history of malignant neoplasm of breast: Secondary | ICD-10-CM | POA: Diagnosis not present

## 2015-08-07 DIAGNOSIS — L598 Other specified disorders of the skin and subcutaneous tissue related to radiation: Secondary | ICD-10-CM | POA: Insufficient documentation

## 2015-08-07 DIAGNOSIS — IMO0001 Reserved for inherently not codable concepts without codable children: Secondary | ICD-10-CM

## 2015-08-07 DIAGNOSIS — C50412 Malignant neoplasm of upper-outer quadrant of left female breast: Secondary | ICD-10-CM | POA: Insufficient documentation

## 2015-08-07 DIAGNOSIS — T451X5S Adverse effect of antineoplastic and immunosuppressive drugs, sequela: Secondary | ICD-10-CM | POA: Insufficient documentation

## 2015-08-07 DIAGNOSIS — I252 Old myocardial infarction: Secondary | ICD-10-CM | POA: Diagnosis not present

## 2015-08-07 DIAGNOSIS — F17210 Nicotine dependence, cigarettes, uncomplicated: Secondary | ICD-10-CM | POA: Diagnosis not present

## 2015-08-07 LAB — CBC WITH DIFFERENTIAL/PLATELET
Basophils Absolute: 0 10*3/uL (ref 0–0.1)
Basophils Relative: 1 %
EOS ABS: 0.2 10*3/uL (ref 0–0.7)
EOS PCT: 6 %
HCT: 40.4 % (ref 35.0–47.0)
Hemoglobin: 13.2 g/dL (ref 12.0–16.0)
LYMPHS ABS: 1.2 10*3/uL (ref 1.0–3.6)
Lymphocytes Relative: 37 %
MCH: 29.6 pg (ref 26.0–34.0)
MCHC: 32.7 g/dL (ref 32.0–36.0)
MCV: 90.3 fL (ref 80.0–100.0)
MONO ABS: 0.3 10*3/uL (ref 0.2–0.9)
MONOS PCT: 8 %
Neutro Abs: 1.5 10*3/uL (ref 1.4–6.5)
Neutrophils Relative %: 48 %
PLATELETS: 197 10*3/uL (ref 150–440)
RBC: 4.48 MIL/uL (ref 3.80–5.20)
RDW: 15.7 % — AB (ref 11.5–14.5)
WBC: 3.2 10*3/uL — AB (ref 3.6–11.0)

## 2015-08-08 ENCOUNTER — Other Ambulatory Visit: Payer: BLUE CROSS/BLUE SHIELD

## 2015-08-08 ENCOUNTER — Ambulatory Visit: Payer: BLUE CROSS/BLUE SHIELD | Admitting: Internal Medicine

## 2015-08-08 ENCOUNTER — Ambulatory Visit: Payer: BLUE CROSS/BLUE SHIELD

## 2015-08-08 ENCOUNTER — Ambulatory Visit
Admission: RE | Admit: 2015-08-08 | Discharge: 2015-08-08 | Disposition: A | Discharge status: Home / Self Care | Payer: BLUE CROSS/BLUE SHIELD | Source: Ambulatory Visit | Attending: Radiation Oncology | Admitting: Radiation Oncology

## 2015-08-08 DIAGNOSIS — Z510 Encounter for antineoplastic radiation therapy: Secondary | ICD-10-CM | POA: Diagnosis not present

## 2015-08-09 ENCOUNTER — Ambulatory Visit
Admission: RE | Admit: 2015-08-09 | Discharge: 2015-08-09 | Disposition: A | Discharge status: Home / Self Care | Payer: BLUE CROSS/BLUE SHIELD | Source: Ambulatory Visit | Attending: Radiation Oncology | Admitting: Radiation Oncology

## 2015-08-09 ENCOUNTER — Telehealth: Payer: Self-pay | Admitting: *Deleted

## 2015-08-09 DIAGNOSIS — Z510 Encounter for antineoplastic radiation therapy: Secondary | ICD-10-CM | POA: Diagnosis not present

## 2015-08-09 DIAGNOSIS — C50412 Malignant neoplasm of upper-outer quadrant of left female breast: Secondary | ICD-10-CM

## 2015-08-09 MED ORDER — FUROSEMIDE 20 MG PO TABS: ORAL_TABLET | ORAL | Status: DC

## 2015-08-09 NOTE — Telephone Encounter (Signed)
Escribed

## 2015-08-12 ENCOUNTER — Ambulatory Visit
Admission: RE | Admit: 2015-08-12 | Discharge: 2015-08-12 | Disposition: A | Discharge status: Home / Self Care | Payer: BLUE CROSS/BLUE SHIELD | Source: Ambulatory Visit | Attending: Radiation Oncology | Admitting: Radiation Oncology

## 2015-08-12 DIAGNOSIS — Z510 Encounter for antineoplastic radiation therapy: Secondary | ICD-10-CM | POA: Diagnosis not present

## 2015-08-13 ENCOUNTER — Ambulatory Visit
Admission: RE | Admit: 2015-08-13 | Discharge: 2015-08-13 | Disposition: A | Discharge status: Home / Self Care | Payer: BLUE CROSS/BLUE SHIELD | Source: Ambulatory Visit | Attending: Radiation Oncology | Admitting: Radiation Oncology

## 2015-08-13 DIAGNOSIS — Z510 Encounter for antineoplastic radiation therapy: Secondary | ICD-10-CM | POA: Diagnosis not present

## 2015-08-14 ENCOUNTER — Ambulatory Visit
Admission: RE | Admit: 2015-08-14 | Discharge: 2015-08-14 | Disposition: A | Discharge status: Home / Self Care | Payer: BLUE CROSS/BLUE SHIELD | Source: Ambulatory Visit | Attending: Radiation Oncology | Admitting: Radiation Oncology

## 2015-08-14 DIAGNOSIS — Z510 Encounter for antineoplastic radiation therapy: Secondary | ICD-10-CM | POA: Diagnosis not present

## 2015-08-15 ENCOUNTER — Inpatient Hospital Stay: Payer: BLUE CROSS/BLUE SHIELD

## 2015-08-15 ENCOUNTER — Inpatient Hospital Stay (HOSPITAL_BASED_OUTPATIENT_CLINIC_OR_DEPARTMENT_OTHER): Payer: BLUE CROSS/BLUE SHIELD | Admitting: Internal Medicine

## 2015-08-15 ENCOUNTER — Other Ambulatory Visit: Payer: BLUE CROSS/BLUE SHIELD

## 2015-08-15 ENCOUNTER — Ambulatory Visit: Payer: BLUE CROSS/BLUE SHIELD

## 2015-08-15 ENCOUNTER — Encounter: Payer: Self-pay | Admitting: Internal Medicine

## 2015-08-15 ENCOUNTER — Ambulatory Visit: Payer: BLUE CROSS/BLUE SHIELD | Admitting: Internal Medicine

## 2015-08-15 ENCOUNTER — Ambulatory Visit
Admission: RE | Admit: 2015-08-15 | Discharge: 2015-08-15 | Disposition: A | Discharge status: Home / Self Care | Payer: BLUE CROSS/BLUE SHIELD | Source: Ambulatory Visit | Attending: Radiation Oncology | Admitting: Radiation Oncology

## 2015-08-15 ENCOUNTER — Other Ambulatory Visit: Payer: Self-pay | Admitting: *Deleted

## 2015-08-15 VITALS — BP 107/71 | HR 97 | Temp 96.8°F | Wt 142.2 lb

## 2015-08-15 DIAGNOSIS — C50912 Malignant neoplasm of unspecified site of left female breast: Secondary | ICD-10-CM

## 2015-08-15 DIAGNOSIS — C50412 Malignant neoplasm of upper-outer quadrant of left female breast: Secondary | ICD-10-CM | POA: Diagnosis not present

## 2015-08-15 DIAGNOSIS — T451X5S Adverse effect of antineoplastic and immunosuppressive drugs, sequela: Secondary | ICD-10-CM

## 2015-08-15 DIAGNOSIS — Z9221 Personal history of antineoplastic chemotherapy: Secondary | ICD-10-CM

## 2015-08-15 DIAGNOSIS — F17210 Nicotine dependence, cigarettes, uncomplicated: Secondary | ICD-10-CM

## 2015-08-15 DIAGNOSIS — C773 Secondary and unspecified malignant neoplasm of axilla and upper limb lymph nodes: Secondary | ICD-10-CM | POA: Diagnosis not present

## 2015-08-15 DIAGNOSIS — E785 Hyperlipidemia, unspecified: Secondary | ICD-10-CM

## 2015-08-15 DIAGNOSIS — Z510 Encounter for antineoplastic radiation therapy: Secondary | ICD-10-CM | POA: Diagnosis not present

## 2015-08-15 DIAGNOSIS — I10 Essential (primary) hypertension: Secondary | ICD-10-CM

## 2015-08-15 DIAGNOSIS — L598 Other specified disorders of the skin and subcutaneous tissue related to radiation: Secondary | ICD-10-CM

## 2015-08-15 DIAGNOSIS — G620 Drug-induced polyneuropathy: Secondary | ICD-10-CM | POA: Diagnosis not present

## 2015-08-15 DIAGNOSIS — M7989 Other specified soft tissue disorders: Secondary | ICD-10-CM

## 2015-08-15 DIAGNOSIS — Z170 Estrogen receptor positive status [ER+]: Secondary | ICD-10-CM | POA: Diagnosis not present

## 2015-08-15 DIAGNOSIS — I252 Old myocardial infarction: Secondary | ICD-10-CM

## 2015-08-15 LAB — COMPREHENSIVE METABOLIC PANEL
ALBUMIN: 4 g/dL (ref 3.5–5.0)
ALT: 19 U/L (ref 14–54)
AST: 27 U/L (ref 15–41)
Alkaline Phosphatase: 53 U/L (ref 38–126)
Anion gap: 10 (ref 5–15)
BILIRUBIN TOTAL: 1 mg/dL (ref 0.3–1.2)
BUN: 16 mg/dL (ref 6–20)
CO2: 27 mmol/L (ref 22–32)
CREATININE: 0.89 mg/dL (ref 0.44–1.00)
Calcium: 8.7 mg/dL — ABNORMAL LOW (ref 8.9–10.3)
Chloride: 98 mmol/L — ABNORMAL LOW (ref 101–111)
GFR calc Af Amer: >60 mL/min (ref >60)
GLUCOSE: 130 mg/dL — AB (ref 65–99)
POTASSIUM: 3.4 mmol/L — AB (ref 3.5–5.1)
Sodium: 135 mmol/L (ref 135–145)
TOTAL PROTEIN: 7 g/dL (ref 6.5–8.1)

## 2015-08-15 LAB — CBC WITH DIFFERENTIAL/PLATELET
BASOS ABS: 0 10*3/uL (ref 0–0.1)
BASOS PCT: 1 %
Eosinophils Absolute: 0.1 10*3/uL (ref 0–0.7)
Eosinophils Relative: 3 %
HEMATOCRIT: 39.8 % (ref 35.0–47.0)
HEMOGLOBIN: 13.3 g/dL (ref 12.0–16.0)
LYMPHS PCT: 36 %
Lymphs Abs: 1.2 10*3/uL (ref 1.0–3.6)
MCH: 29.7 pg (ref 26.0–34.0)
MCHC: 33.4 g/dL (ref 32.0–36.0)
MCV: 88.8 fL (ref 80.0–100.0)
Monocytes Absolute: 0.2 10*3/uL (ref 0.2–0.9)
Monocytes Relative: 6 %
NEUTROS ABS: 1.8 10*3/uL (ref 1.4–6.5)
NEUTROS PCT: 54 %
Platelets: 194 10*3/uL (ref 150–440)
RBC: 4.48 MIL/uL (ref 3.80–5.20)
RDW: 15.5 % — ABNORMAL HIGH (ref 11.5–14.5)
WBC: 3.2 10*3/uL — AB (ref 3.6–11.0)

## 2015-08-15 MED ORDER — DIPHENHYDRAMINE HCL 25 MG PO CAPS
50.0000 mg | ORAL_CAPSULE | Freq: Once | ORAL | Status: AC
  Administered 2015-08-15: 50 mg via ORAL
  Filled 2015-08-15: qty 2

## 2015-08-15 MED ORDER — SODIUM CHLORIDE 0.9 % IV SOLN
6.0000 mg/kg | Freq: Once | INTRAVENOUS | Status: AC
  Administered 2015-08-15: 420 mg via INTRAVENOUS
  Filled 2015-08-15: qty 20

## 2015-08-15 MED ORDER — HEPARIN SOD (PORK) LOCK FLUSH 100 UNIT/ML IV SOLN
500.0000 [IU] | Freq: Once | INTRAVENOUS | Status: AC
  Administered 2015-08-15: 500 [IU] via INTRAVENOUS
  Filled 2015-08-15: qty 5

## 2015-08-15 MED ORDER — SODIUM CHLORIDE 0.9 % IJ SOLN
10.0000 mL | INTRAMUSCULAR | Status: DC | PRN
  Administered 2015-08-15: 10 mL via INTRAVENOUS
  Filled 2015-08-15: qty 10

## 2015-08-15 MED ORDER — SODIUM CHLORIDE 0.9 % IV SOLN
Freq: Once | INTRAVENOUS | Status: AC
  Administered 2015-08-15: 15:00:00 via INTRAVENOUS
  Filled 2015-08-15: qty 1000

## 2015-08-15 MED ORDER — ACETAMINOPHEN 325 MG PO TABS
650.0000 mg | ORAL_TABLET | Freq: Once | ORAL | Status: AC
  Administered 2015-08-15: 650 mg via ORAL
  Filled 2015-08-15: qty 2

## 2015-08-15 NOTE — Progress Notes (Signed)
Patient ambulates independently w/o assistance.  Patient c/o of numbness/tingling, pain and swelling in hands, feet and legs.  Patient rates pain in fingers feet 8 oof 10.  Patient in exam room 4 unaccompanied.

## 2015-08-15 NOTE — Progress Notes (Signed)
[facility] OFFICE PROGRESS NOTE  Patient Care Team: [name], MD as PCP - General (Family Medicine) [name], MD (General Surgery) Sarah Alf, MD as Attending Physician (Internal Medicine)   SUMMARY OF ONCOLOGIC HISTORY:  # LEFT BREAST IDC; STAGE II [cT2N1] ER >90%; PR- 50-90%; her 2 NEU- POS; s/p Neoadj chemo; AUG 2016- TCH+P s/p Lumpec & partial ALND- path CR; s/p RT;  adj Herceptin   # PN- G-2   # April 2016- Liver Bx- NEG   INTERVAL HISTORY:  This is my first interaction with the patient since I joined the practice September 2016. I reviewed the patient's prior charts/pertinent labs/imaging in detail; findings are summarized above.   A very pleasant 59 year old female patient with above history of stage II ER/PR positive HER-2/neu positive breast cancer currently on adjuvant/maintenance Herceptin is here for follow-up.  Patient complains of significant tingling and numbness of feet; and also the hands. She denies any falls. But admits to a burning pain. She is on Neurontin 200 mg 3 times a day.   She also complains of erythema/warmth in the radiation portal. Denies any fever or chills or cough.   REVIEW OF SYSTEMS:  A complete 10 point review of system is done which is negative except mentioned above/history of present illness.   PAST MEDICAL HISTORY :  Past Medical History  Diagnosis Date  . Hypertension   . Hyperlipidemia   . Heart attack ([place]) 1998  . Cancer ([place]) 2016    Stage II, ER positive, PR positive, HER-2/neu overexpressing of the left breast.  . Neuropathy ([place])     PAST SURGICAL HISTORY :   Past Surgical History  Procedure Laterality Date  . Portacath placement Right 12-31-14    Dr Bary Castilla  . Breast lumpectomy with sentinel lymph node biopsy Left 05/23/2015    Procedure: LEFT BREAST WIDE EXCISION WITH AXILLARY DISSECTION, MASTOPLASTY ;  Surgeon: [name], MD;  Location: [facility] ORS;  Service: General;  Laterality:  Left;  . Breast surgery Left 12/18/14    breast biopsy/INVASIVE DUCTAL CARCINOMA OF BREAST, NOTTINGHAM GRADE 2.  . Breast surgery  05/23/2015.    Wide excision/mastoplasty, axillary dissection. No residual invasive cancer, positive for residual DCIS. 0/2 nodes identified on axillary dissection. (no SLN by technetium or methylene blue)    FAMILY HISTORY :   Family History  Problem Relation Age of Onset  . Breast cancer Maternal Aunt   . Breast cancer Cousin   . Brain cancer Maternal Uncle     SOCIAL HISTORY:   Social History  Substance Use Topics  . Smoking status: Current Every Day Smoker -- 0.50 packs/day for 18 years    Types: Cigarettes  . Smokeless tobacco: Not on file  . Alcohol Use: No    ALLERGIES:  is allergic to no known allergies.  MEDICATIONS:  Current Outpatient Prescriptions  Medication Sig Dispense Refill  . aspirin 81 MG tablet Take 81 mg by mouth daily.    Marland Kitchen atenolol (TENORMIN) 25 MG tablet Take 1 tablet (25 mg total) by mouth daily. 90 tablet 0  . furosemide (LASIX) 20 MG tablet Take 1 tablet orally once daily as needed for swelling. 30 tablet 0  . gabapentin (NEURONTIN) 100 MG capsule Take 2 capsules (200 mg total) by mouth 3 (three) times daily. (Patient taking differently: Take 200 mg by mouth 3 (three) times daily. ) 180 capsule 3  . lisinopril (PRINIVIL,ZESTRIL) 10 MG tablet Take 1 tablet (10 mg  total) by mouth daily. 90 tablet 0  . simvastatin (ZOCOR) 40 MG tablet Take 1 tablet (40 mg total) by mouth daily at 6 PM. 14 tablet 0   No current facility-administered medications for this visit.   Facility-Administered Medications Ordered in Other Visits  Medication Dose Route Frequency Provider Last Rate Last Dose  . 0.9 %  sodium chloride infusion   Intravenous Continuous Sarah Alf, MD      . 0.9 %  sodium chloride infusion   Intravenous Continuous Lloyd Huger, MD 999 mL/hr at 04/24/15 1520    . famotidine (PEPCID) IVPB 20 mg  20 mg Intravenous  Q12H Sarah Alf, MD   20 mg at 01/29/15 1230  . heparin lock flush 100 unit/mL  500 Units Intravenous Once Cammie Sickle, MD      . sodium chloride 0.45 % 1,000 mL infusion   Intravenous Continuous Sarah Alf, MD 500 mL/hr at 04/04/15 1444    . sodium chloride 0.9 % injection 10 mL  10 mL Intravenous PRN Sarah Alf, MD   10 mL at 04/04/15 1440  . sodium chloride 0.9 % injection 10 mL  10 mL Intravenous PRN Cammie Sickle, MD   10 mL at 08/15/15 1311    PHYSICAL EXAMINATION: ECOG PERFORMANCE STATUS: 0 - Asymptomatic  BP 107/71 mmHg  Pulse 97  Temp(Src) 96.8 F (36 C) (Tympanic)  Wt 142 lb 3.2 oz (64.5 kg)  Filed Weights   08/15/15 1334  Weight: 142 lb 3.2 oz (64.5 kg)    GENERAL: Well-nourished well-developed; Alert, no distress and comfortable.  Alone. EYES: no pallor or icterus OROPHARYNX: no thrush or ulceration; good dentition  NECK: supple, no masses felt LYMPH:  no palpable lymphadenopathy in the cervical, axillary or inguinal regions LUNGS: clear to auscultation and  No wheeze or crackles HEART/CVS: regular rate & rhythm and no murmurs; No lower extremity edema ABDOMEN:abdomen soft, non-tender and normal bowel sounds Musculoskeletal:no cyanosis of digits and no clubbing  PSYCH: alert & oriented x 3 with fluent speech NEURO: no focal motor/sensory deficits SKIN:  no rashes or significant lesions; left breast radiation changes noted. No desquamation noted  LABORATORY DATA:  I have reviewed the data as listed    Component Value Date/Time   NA 135 08/15/2015 1309   NA 135 01/08/2015 0850   K 3.4* 08/15/2015 1309   K 3.7 01/08/2015 0850   CL 98* 08/15/2015 1309   CL 101 01/08/2015 0850   CO2 27 08/15/2015 1309   CO2 26 01/08/2015 0850   GLUCOSE 130* 08/15/2015 1309   GLUCOSE 161* 01/08/2015 0850   BUN 16 08/15/2015 1309   BUN 17 01/08/2015 0850   CREATININE 0.89 08/15/2015 1309   CREATININE 0.82 01/22/2015 1559   CALCIUM 8.7* 08/15/2015  1309   CALCIUM 9.3 01/08/2015 0850   PROT 7.0 08/15/2015 1309   PROT 6.8 01/22/2015 1559   ALBUMIN 4.0 08/15/2015 1309   ALBUMIN 3.9 01/22/2015 1559   AST 27 08/15/2015 1309   AST 34 01/22/2015 1559   ALT 19 08/15/2015 1309   ALT 42 01/22/2015 1559   ALKPHOS 53 08/15/2015 1309   ALKPHOS 157* 01/22/2015 1559   BILITOT 1.0 08/15/2015 1309   BILITOT 0.2* 01/22/2015 1559   GFRNONAA >60 08/15/2015 1309   GFRNONAA >60 01/22/2015 1559   GFRAA >60 08/15/2015 1309   GFRAA >60 01/22/2015 1559    No results found for: SPEP, UPEP  Lab Results  Component Value Date   WBC 3.2*  08/15/2015   NEUTROABS 1.8 08/15/2015   HGB 13.3 08/15/2015   HCT 39.8 08/15/2015   MCV 88.8 08/15/2015   PLT 194 08/15/2015      Chemistry      Component Value Date/Time   NA 135 08/15/2015 1309   NA 135 01/08/2015 0850   K 3.4* 08/15/2015 1309   K 3.7 01/08/2015 0850   CL 98* 08/15/2015 1309   CL 101 01/08/2015 0850   CO2 27 08/15/2015 1309   CO2 26 01/08/2015 0850   BUN 16 08/15/2015 1309   BUN 17 01/08/2015 0850   CREATININE 0.89 08/15/2015 1309   CREATININE 0.82 01/22/2015 1559      Component Value Date/Time   CALCIUM 8.7* 08/15/2015 1309   CALCIUM 9.3 01/08/2015 0850   ALKPHOS 53 08/15/2015 1309   ALKPHOS 157* 01/22/2015 1559   AST 27 08/15/2015 1309   AST 34 01/22/2015 1559   ALT 19 08/15/2015 1309   ALT 42 01/22/2015 1559   BILITOT 1.0 08/15/2015 1309   BILITOT 0.2* 01/22/2015 1559       RADIOGRAPHIC STUDIES: I have personally reviewed the radiological images as listed and agreed with the findings in the report. No results found.   ASSESSMENT & PLAN:   # STAGE II LEFT BREAST CA ER/PR-POS; Her 2 NEU- POS- currently on adjuvant/maintenance Herceptin. Patient tolerating treatments well. Without any major side effects. We will discuss use of aromatase inhibitor.  # PERIPHERAL NEUROPATHY- grade 2- from prior Taxotere chemotherapy. Recommend increasing the Neurontin dose to 600 mg 3  times a day.  # Leg swelling- intermittent; recommend coming off Lasix. I'm not concerned about congestive heart failure. Likely dependent edema.  # Patient is due for a MUGA scan in December.  # Radiation dermatitis- continue symptomatic care.   # Follow-up in 6 weeks/Herceptin M.D. Visit./Labs.   No orders of the defined types were placed in this encounter.   All questions were answered. The patient knows to call the clinic with any problems, questions or concerns. No barriers to learning was detected.  I spent 25 minutes counseling the patient face to face. The total time spent in the appointment was 30 minutes and more than 50% was on counseling and review of test results     Cammie Sickle, MD 08/15/2015 1:52 PM

## 2015-08-16 ENCOUNTER — Ambulatory Visit
Admission: RE | Admit: 2015-08-16 | Discharge: 2015-08-16 | Disposition: A | Discharge status: Home / Self Care | Payer: BLUE CROSS/BLUE SHIELD | Source: Ambulatory Visit | Attending: Radiation Oncology | Admitting: Radiation Oncology

## 2015-08-16 DIAGNOSIS — Z510 Encounter for antineoplastic radiation therapy: Secondary | ICD-10-CM | POA: Diagnosis not present

## 2015-08-19 ENCOUNTER — Ambulatory Visit
Admission: RE | Admit: 2015-08-19 | Discharge: 2015-08-19 | Disposition: A | Discharge status: Home / Self Care | Payer: BLUE CROSS/BLUE SHIELD | Source: Ambulatory Visit | Attending: Radiation Oncology | Admitting: Radiation Oncology

## 2015-08-19 ENCOUNTER — Ambulatory Visit (INDEPENDENT_AMBULATORY_CARE_PROVIDER_SITE_OTHER): Payer: BLUE CROSS/BLUE SHIELD | Admitting: General Surgery

## 2015-08-19 VITALS — BP 124/70 | HR 78 | Resp 12 | Ht 66.0 in | Wt 153.0 lb

## 2015-08-19 DIAGNOSIS — Z510 Encounter for antineoplastic radiation therapy: Secondary | ICD-10-CM | POA: Diagnosis not present

## 2015-08-19 DIAGNOSIS — C50912 Malignant neoplasm of unspecified site of left female breast: Secondary | ICD-10-CM

## 2015-08-19 NOTE — Progress Notes (Signed)
Patient ID: Sarah Horne, female   DOB: Aug 11, 1956, 59 y.o.   MRN: [id number]  Chief Complaint  Patient presents with  . Follow-up    breast cancer    HPI Sarah Horne is a 59 y.o. female here today for her follow up breast cancer check up. Patient states she is doing. Last treatment is tomorrow. HPI  Past Medical History  Diagnosis Date  . Hypertension   . Hyperlipidemia   . Heart attack ([place]) 1998  . Cancer ([place]) 2016    Stage II, ER positive, PR positive, HER-2/neu overexpressing of the left breast.  . Neuropathy [facility])     Past Surgical History  Procedure Laterality Date  . Portacath placement Right 12-31-14    Dr Bary Castilla  . Breast lumpectomy with sentinel lymph node biopsy Left 05/23/2015    Procedure: LEFT BREAST WIDE EXCISION WITH AXILLARY DISSECTION, MASTOPLASTY ;  Surgeon: [name], MD;  Location: [facility] ORS;  Service: General;  Laterality: Left;  . Breast surgery Left 12/18/14    breast biopsy/INVASIVE DUCTAL CARCINOMA OF BREAST, NOTTINGHAM GRADE 2.  . Breast surgery  05/23/2015.    Wide excision/mastoplasty, axillary dissection. No residual invasive cancer, positive for residual DCIS. 0/2 nodes identified on axillary dissection. (no SLN by technetium or methylene blue)    Family History  Problem Relation Age of Onset  . Breast cancer Maternal Aunt   . Breast cancer Cousin   . Brain cancer Maternal Uncle     Social History Social History  Substance Use Topics  . Smoking status: Current Every Day Smoker -- 0.50 packs/day for 18 years    Types: Cigarettes  . Smokeless tobacco: Not on file  . Alcohol Use: No    Allergies  Allergen Reactions  . No Known Allergies     Current Outpatient Prescriptions  Medication Sig Dispense Refill  . aspirin 81 MG tablet Take 81 mg by mouth daily.    Marland Kitchen atenolol (TENORMIN) 25 MG tablet Take 1 tablet (25 mg total) by mouth daily. 90 tablet 0  . furosemide (LASIX) 20 MG tablet Take 1 tablet orally once daily as  needed for swelling. 30 tablet 0  . gabapentin (NEURONTIN) 100 MG capsule Take 2 capsules (200 mg total) by mouth 3 (three) times daily. (Patient taking differently: Take 200 mg by mouth 3 (three) times daily. ) 180 capsule 3  . lisinopril (PRINIVIL,ZESTRIL) 10 MG tablet Take 1 tablet (10 mg total) by mouth daily. 90 tablet 0  . simvastatin (ZOCOR) 40 MG tablet Take 1 tablet (40 mg total) by mouth daily at 6 PM. 14 tablet 0   No current facility-administered medications for this visit.   Facility-Administered Medications Ordered in Other Visits  Medication Dose Route Frequency Provider Last Rate Last Dose  . 0.9 %  sodium chloride infusion   Intravenous Continuous [name], MD      . 0.9 %  sodium chloride infusion   Intravenous Continuous Lloyd Huger, MD 999 mL/hr at 04/24/15 1520    . sodium chloride 0.45 % 1,000 mL infusion   Intravenous Continuous [name], MD 500 mL/hr at 04/04/15 1444    . sodium chloride 0.9 % injection 10 mL  10 mL Intravenous PRN [name], MD   10 mL at 04/04/15 1440    Review of Systems Review of Systems  Constitutional: Negative.   Respiratory: Negative.   Cardiovascular: Negative.     Blood pressure 124/70, pulse 78, resp. rate 12, height 5' 6"  (  1.676 m), weight 153 lb (69.4 kg).  Physical Exam Physical Exam  Constitutional: She is oriented to person, place, and time. She appears well-developed and well-nourished.  Pulmonary/Chest: Right breast exhibits no inverted nipple, no mass, no nipple discharge, no skin change and no tenderness. Left breast exhibits skin change ( due to radiation.). Left breast exhibits no inverted nipple, no mass, no nipple discharge and no tenderness.  Musculoskeletal:  The upper extremities were measured at a location 15 cm superior as well as 10 and 20 cm inferior to the olecranon process.  On the right side these measurements were 33.5, 28 and 21 cm respectively.  On the left side these measurements  were 34, 27-1/2 and 20 cm respectively.  No evidence of lymphedema.  Neurological: She is alert and oriented to person, place, and time.  Skin: Skin is warm and dry.      Assessment    Doing well status post neoadjuvant chemotherapy, surgery and radiation.    Plan    Patient was advised to coloration will slowly return to normal over the next 12-24 months. We'll plan for a repeat exam without mammograms in March 2017.    PCP:  Raynelle Chary 08/19/2015, 4:48 PM

## 2015-08-19 NOTE — Patient Instructions (Addendum)
Patient to return in March 2017 for breast cancer check up.

## 2015-08-20 ENCOUNTER — Ambulatory Visit
Admission: RE | Admit: 2015-08-20 | Discharge: 2015-08-20 | Disposition: A | Discharge status: Home / Self Care | Payer: BLUE CROSS/BLUE SHIELD | Source: Ambulatory Visit | Attending: Radiation Oncology | Admitting: Radiation Oncology

## 2015-08-21 DIAGNOSIS — Z510 Encounter for antineoplastic radiation therapy: Secondary | ICD-10-CM | POA: Diagnosis not present

## 2015-09-05 ENCOUNTER — Inpatient Hospital Stay: Payer: BLUE CROSS/BLUE SHIELD | Attending: Internal Medicine

## 2015-09-05 ENCOUNTER — Ambulatory Visit: Payer: BLUE CROSS/BLUE SHIELD

## 2015-09-05 ENCOUNTER — Other Ambulatory Visit: Payer: BLUE CROSS/BLUE SHIELD

## 2015-09-05 DIAGNOSIS — I10 Essential (primary) hypertension: Secondary | ICD-10-CM | POA: Insufficient documentation

## 2015-09-05 DIAGNOSIS — G620 Drug-induced polyneuropathy: Secondary | ICD-10-CM | POA: Diagnosis not present

## 2015-09-05 DIAGNOSIS — Z5111 Encounter for antineoplastic chemotherapy: Secondary | ICD-10-CM | POA: Insufficient documentation

## 2015-09-05 DIAGNOSIS — C50412 Malignant neoplasm of upper-outer quadrant of left female breast: Secondary | ICD-10-CM

## 2015-09-05 DIAGNOSIS — Z7982 Long term (current) use of aspirin: Secondary | ICD-10-CM | POA: Diagnosis not present

## 2015-09-05 DIAGNOSIS — F17210 Nicotine dependence, cigarettes, uncomplicated: Secondary | ICD-10-CM | POA: Insufficient documentation

## 2015-09-05 DIAGNOSIS — Z803 Family history of malignant neoplasm of breast: Secondary | ICD-10-CM | POA: Insufficient documentation

## 2015-09-05 DIAGNOSIS — Z79899 Other long term (current) drug therapy: Secondary | ICD-10-CM | POA: Diagnosis not present

## 2015-09-05 DIAGNOSIS — I252 Old myocardial infarction: Secondary | ICD-10-CM | POA: Diagnosis not present

## 2015-09-05 DIAGNOSIS — E785 Hyperlipidemia, unspecified: Secondary | ICD-10-CM | POA: Insufficient documentation

## 2015-09-05 DIAGNOSIS — Z170 Estrogen receptor positive status [ER+]: Secondary | ICD-10-CM | POA: Insufficient documentation

## 2015-09-05 DIAGNOSIS — T451X5S Adverse effect of antineoplastic and immunosuppressive drugs, sequela: Secondary | ICD-10-CM | POA: Diagnosis not present

## 2015-09-05 DIAGNOSIS — C773 Secondary and unspecified malignant neoplasm of axilla and upper limb lymph nodes: Secondary | ICD-10-CM | POA: Insufficient documentation

## 2015-09-05 MED ORDER — ACETAMINOPHEN 325 MG PO TABS
650.0000 mg | ORAL_TABLET | Freq: Once | ORAL | Status: AC
  Administered 2015-09-05: 650 mg via ORAL
  Filled 2015-09-05: qty 2

## 2015-09-05 MED ORDER — TRASTUZUMAB CHEMO INJECTION 440 MG
6.0000 mg/kg | Freq: Once | INTRAVENOUS | Status: AC
  Administered 2015-09-05: 420 mg via INTRAVENOUS
  Filled 2015-09-05: qty 20

## 2015-09-05 MED ORDER — HEPARIN SOD (PORK) LOCK FLUSH 100 UNIT/ML IV SOLN
500.0000 [IU] | Freq: Once | INTRAVENOUS | Status: AC | PRN
Start: 2015-09-05 — End: 2015-09-05
  Administered 2015-09-05: 500 [IU]
  Filled 2015-09-05: qty 5

## 2015-09-05 MED ORDER — DIPHENHYDRAMINE HCL 25 MG PO CAPS
50.0000 mg | ORAL_CAPSULE | Freq: Once | ORAL | Status: AC
  Administered 2015-09-05: 50 mg via ORAL
  Filled 2015-09-05: qty 2

## 2015-09-05 MED ORDER — SODIUM CHLORIDE 0.9 % IV SOLN
Freq: Once | INTRAVENOUS | Status: AC
  Administered 2015-09-05: 15:00:00 via INTRAVENOUS
  Filled 2015-09-05: qty 1000

## 2015-09-16 ENCOUNTER — Ambulatory Visit
Admission: RE | Admit: 2015-09-16 | Discharge: 2015-09-16 | Disposition: A | Discharge status: Home / Self Care | Payer: BLUE CROSS/BLUE SHIELD | Source: Ambulatory Visit | Attending: Internal Medicine | Admitting: Internal Medicine

## 2015-09-16 ENCOUNTER — Ambulatory Visit: Payer: BLUE CROSS/BLUE SHIELD

## 2015-09-16 DIAGNOSIS — C50912 Malignant neoplasm of unspecified site of left female breast: Secondary | ICD-10-CM | POA: Insufficient documentation

## 2015-09-16 DIAGNOSIS — I5189 Other ill-defined heart diseases: Secondary | ICD-10-CM | POA: Diagnosis not present

## 2015-09-16 DIAGNOSIS — Z79899 Other long term (current) drug therapy: Secondary | ICD-10-CM | POA: Insufficient documentation

## 2015-09-16 MED ORDER — TECHNETIUM TC 99M-LABELED RED BLOOD CELLS IV KIT
20.0000 | PACK | Freq: Once | INTRAVENOUS | Status: AC | PRN
  Administered 2015-09-16: 18.93 via INTRAVENOUS

## 2015-09-18 ENCOUNTER — Telehealth: Payer: Self-pay | Admitting: Internal Medicine

## 2015-09-18 NOTE — Telephone Encounter (Signed)
I reviewed the MUGA scan; drop in ejection fraction is 12 points. If patient is asymptomatic- continue Herceptin; however if patient is symptomatic [ short of breath cough; swelling in the legs]- recommend holding Herceptin. Check BNP.

## 2015-09-26 ENCOUNTER — Inpatient Hospital Stay (HOSPITAL_BASED_OUTPATIENT_CLINIC_OR_DEPARTMENT_OTHER): Payer: BLUE CROSS/BLUE SHIELD | Admitting: Family Medicine

## 2015-09-26 ENCOUNTER — Other Ambulatory Visit: Payer: Self-pay | Admitting: Family Medicine

## 2015-09-26 ENCOUNTER — Encounter: Payer: Self-pay | Admitting: Oncology

## 2015-09-26 ENCOUNTER — Inpatient Hospital Stay: Payer: BLUE CROSS/BLUE SHIELD

## 2015-09-26 VITALS — BP 115/80 | HR 103 | Temp 95.7°F | Resp 18 | Wt 153.0 lb

## 2015-09-26 DIAGNOSIS — Z7982 Long term (current) use of aspirin: Secondary | ICD-10-CM

## 2015-09-26 DIAGNOSIS — Z79899 Other long term (current) drug therapy: Secondary | ICD-10-CM

## 2015-09-26 DIAGNOSIS — G620 Drug-induced polyneuropathy: Secondary | ICD-10-CM | POA: Diagnosis not present

## 2015-09-26 DIAGNOSIS — C773 Secondary and unspecified malignant neoplasm of axilla and upper limb lymph nodes: Secondary | ICD-10-CM

## 2015-09-26 DIAGNOSIS — E785 Hyperlipidemia, unspecified: Secondary | ICD-10-CM

## 2015-09-26 DIAGNOSIS — C50412 Malignant neoplasm of upper-outer quadrant of left female breast: Secondary | ICD-10-CM

## 2015-09-26 DIAGNOSIS — I252 Old myocardial infarction: Secondary | ICD-10-CM

## 2015-09-26 DIAGNOSIS — Z803 Family history of malignant neoplasm of breast: Secondary | ICD-10-CM

## 2015-09-26 DIAGNOSIS — T451X5S Adverse effect of antineoplastic and immunosuppressive drugs, sequela: Secondary | ICD-10-CM

## 2015-09-26 DIAGNOSIS — F17210 Nicotine dependence, cigarettes, uncomplicated: Secondary | ICD-10-CM

## 2015-09-26 DIAGNOSIS — I10 Essential (primary) hypertension: Secondary | ICD-10-CM

## 2015-09-26 DIAGNOSIS — Z170 Estrogen receptor positive status [ER+]: Secondary | ICD-10-CM | POA: Diagnosis not present

## 2015-09-26 LAB — CBC WITH DIFFERENTIAL/PLATELET
BASOS PCT: 1 %
Basophils Absolute: 0 10*3/uL (ref 0–0.1)
EOS ABS: 0.1 10*3/uL (ref 0–0.7)
EOS PCT: 4 %
HCT: 40.5 % (ref 35.0–47.0)
HEMOGLOBIN: 13.6 g/dL (ref 12.0–16.0)
Lymphocytes Relative: 36 %
Lymphs Abs: 1.3 10*3/uL (ref 1.0–3.6)
MCH: 29.9 pg (ref 26.0–34.0)
MCHC: 33.5 g/dL (ref 32.0–36.0)
MCV: 89.3 fL (ref 80.0–100.0)
MONO ABS: 0.2 10*3/uL (ref 0.2–0.9)
MONOS PCT: 6 %
NEUTROS PCT: 53 %
Neutro Abs: 1.9 10*3/uL (ref 1.4–6.5)
PLATELETS: 216 10*3/uL (ref 150–440)
RBC: 4.53 MIL/uL (ref 3.80–5.20)
RDW: 15.8 % — AB (ref 11.5–14.5)
WBC: 3.7 10*3/uL (ref 3.6–11.0)

## 2015-09-26 LAB — COMPREHENSIVE METABOLIC PANEL
ALBUMIN: 4 g/dL (ref 3.5–5.0)
ALT: 22 U/L (ref 14–54)
ANION GAP: 8 (ref 5–15)
AST: 23 U/L (ref 15–41)
Alkaline Phosphatase: 59 U/L (ref 38–126)
BUN: 26 mg/dL — AB (ref 6–20)
CALCIUM: 8.9 mg/dL (ref 8.9–10.3)
CO2: 25 mmol/L (ref 22–32)
Chloride: 101 mmol/L (ref 101–111)
Creatinine, Ser: 0.77 mg/dL (ref 0.44–1.00)
GFR calc Af Amer: >60 mL/min (ref >60)
GFR calc non Af Amer: >60 mL/min (ref >60)
GLUCOSE: 130 mg/dL — AB (ref 65–99)
POTASSIUM: 4 mmol/L (ref 3.5–5.1)
SODIUM: 134 mmol/L — AB (ref 135–145)
Total Bilirubin: 0.7 mg/dL (ref 0.3–1.2)
Total Protein: 7.3 g/dL (ref 6.5–8.1)

## 2015-09-26 MED ORDER — DIPHENHYDRAMINE HCL 25 MG PO CAPS
50.0000 mg | ORAL_CAPSULE | Freq: Once | ORAL | Status: AC
  Administered 2015-09-26: 25 mg via ORAL
  Filled 2015-09-26: qty 2

## 2015-09-26 MED ORDER — SODIUM CHLORIDE 0.9 % IJ SOLN
10.0000 mL | INTRAMUSCULAR | Status: DC | PRN
  Administered 2015-09-26: 10 mL via INTRAVENOUS
  Filled 2015-09-26: qty 10

## 2015-09-26 MED ORDER — SODIUM CHLORIDE 0.9 % IV SOLN
Freq: Once | INTRAVENOUS | Status: AC
  Administered 2015-09-26: 15:00:00 via INTRAVENOUS
  Filled 2015-09-26: qty 1000

## 2015-09-26 MED ORDER — ACETAMINOPHEN 325 MG PO TABS
650.0000 mg | ORAL_TABLET | Freq: Once | ORAL | Status: AC
Start: 2015-09-26 — End: 2015-09-26
  Administered 2015-09-26: 650 mg via ORAL
  Filled 2015-09-26: qty 2

## 2015-09-26 MED ORDER — SODIUM CHLORIDE 0.9 % IV SOLN
6.0000 mg/kg | Freq: Once | INTRAVENOUS | Status: AC
  Administered 2015-09-26: 420 mg via INTRAVENOUS
  Filled 2015-09-26: qty 20

## 2015-09-26 MED ORDER — HEPARIN SOD (PORK) LOCK FLUSH 100 UNIT/ML IV SOLN: 500.0000 [IU] | Freq: Once | INTRAVENOUS | Status: DC | PRN

## 2015-09-26 MED ORDER — HEPARIN SOD (PORK) LOCK FLUSH 100 UNIT/ML IV SOLN
500.0000 [IU] | Freq: Once | INTRAVENOUS | Status: AC
  Administered 2015-09-26: 500 [IU] via INTRAVENOUS
  Filled 2015-09-26: qty 5

## 2015-09-26 NOTE — Progress Notes (Signed)
Patient states she had her last chemo in July and still is having problems with neuropathy in her hands and feet that has not improved.

## 2015-09-26 NOTE — Progress Notes (Addendum)
[place]  [phone]  [phone]     Sarah Horne DOB: 06-11-1956  MR#: [id number]  TDD#:[id number]  Patient Care Team: [name], MD as PCP - General (Family Medicine) [name], MD (General Surgery) [name], MD as Attending Physician (Internal Medicine)  CHIEF COMPLAINT:  Chief Complaint  Patient presents with  . Breast Cancer  LEFT BREAST IDC; STAGE II [cT2N1] ER >90%; PR- 50-90%; her 2 NEU- POS; s/p Neoadj chemo; AUG 2016- TCH+P s/p Lumpec & partial ALND- path CR; s/p RT; adj Herceptin  PN- G-29 December 2014- Liver Bx- NEG  INTERVAL HISTORY:  Patient is here for further follow up and treatment consideration regarding stage II ER/PR positive HER-2/neu positive breast cancer, currently on adjuvant/maintenance Herceptin. Patient reports overall feeling very well. She denies any sob, cough, fatigue. She has had a recent routine Muga scan as is protocol while on Herceptin. MUGA showed decreased EF from 63% to 51%. She denies any complaints and therefore will continue with treatment as scheduled. She also continues with persistent tingling and numbness in hands and feet. Her current dose of Neurontin is effective in managing neuropathy.   REVIEW OF SYSTEMS:   Review of Systems  Constitutional: Negative for fever, chills, weight loss, malaise/fatigue and diaphoresis.  HENT: Negative for congestion, ear discharge, ear pain, hearing loss, nosebleeds, sore throat and tinnitus.   Eyes: Negative for blurred vision, double vision, photophobia, pain, discharge and redness.  Respiratory: Negative for cough, hemoptysis, sputum production, shortness of breath, wheezing and stridor.   Cardiovascular: Negative for chest pain, palpitations, orthopnea, claudication, leg swelling and PND.  Gastrointestinal: Negative for heartburn, nausea, vomiting, abdominal pain, diarrhea, constipation, blood in stool and melena.  Genitourinary:  Negative.   Musculoskeletal: Negative.   Skin: Negative.   Neurological: Positive for tingling. Negative for dizziness, focal weakness, seizures, weakness and headaches.       Chemotherapy-induced peripheral neuropathy  Endo/Heme/Allergies: Does not bruise/bleed easily.  Psychiatric/Behavioral: Negative for depression. The patient is not nervous/anxious and does not have insomnia.     As per HPI. Otherwise, a complete review of systems is negatve.   PAST MEDICAL HISTORY: Past Medical History  Diagnosis Date  . Hypertension   . Hyperlipidemia   . Heart attack ([place]) 1998  . Cancer ([place]) 2016    Stage II, ER positive, PR positive, HER-2/neu overexpressing of the left breast.  . Neuropathy ([place])     PAST SURGICAL HISTORY: Past Surgical History  Procedure Laterality Date  . Portacath placement Right 12-31-14    Dr Bary Castilla  . Breast lumpectomy with sentinel lymph node biopsy Left 05/23/2015    Procedure: LEFT BREAST WIDE EXCISION WITH AXILLARY DISSECTION, MASTOPLASTY ;  Surgeon: [name], MD;  Location: [facility] ORS;  Service: General;  Laterality: Left;  . Breast surgery Left 12/18/14    breast biopsy/INVASIVE DUCTAL CARCINOMA OF BREAST, NOTTINGHAM GRADE 2.  . Breast surgery  05/23/2015.    Wide excision/mastoplasty, axillary dissection. No residual invasive cancer, positive for residual DCIS. 0/2 nodes identified on axillary dissection. (no SLN by technetium or methylene blue)    FAMILY HISTORY Family History  Problem Relation Age of Onset  . Breast cancer Maternal Aunt   . Breast cancer Cousin   . Brain cancer Maternal Uncle     GYNECOLOGIC HISTORY:  No LMP recorded. Patient is postmenopausal.     ADVANCED DIRECTIVES:    HEALTH MAINTENANCE: Social History  Substance Use Topics  .  Smoking status: Current Every Day Smoker -- 0.50 packs/day for 18 years    Types: Cigarettes  . Smokeless tobacco: Not on file  . Alcohol Use: No     Allergies  Allergen Reactions    . No Known Allergies     Current Outpatient Prescriptions  Medication Sig Dispense Refill  . aspirin 81 MG tablet Take 81 mg by mouth daily.    Marland Kitchen atenolol (TENORMIN) 25 MG tablet Take 1 tablet (25 mg total) by mouth daily. 90 tablet 0  . furosemide (LASIX) 20 MG tablet Take 1 tablet orally once daily as needed for swelling. 30 tablet 0  . gabapentin (NEURONTIN) 100 MG capsule Take 2 capsules (200 mg total) by mouth 3 (three) times daily. (Patient taking differently: Take 200 mg by mouth 3 (three) times daily. ) 180 capsule 3  . lisinopril (PRINIVIL,ZESTRIL) 10 MG tablet Take 1 tablet (10 mg total) by mouth daily. 90 tablet 0  . simvastatin (ZOCOR) 40 MG tablet Take 1 tablet (40 mg total) by mouth daily at 6 PM. 14 tablet 0   No current facility-administered medications for this visit.   Facility-Administered Medications Ordered in Other Visits  Medication Dose Route Frequency Provider Last Rate Last Dose  . 0.9 %  sodium chloride infusion   Intravenous Continuous [name], MD      . 0.9 %  sodium chloride infusion   Intravenous Continuous Lloyd Huger, MD 999 mL/hr at 04/24/15 1520    . heparin lock flush 100 unit/mL  500 Units Intravenous Once Cammie Sickle, MD      . heparin lock flush 100 unit/mL  500 Units Intracatheter Once PRN Evlyn Kanner, NP      . sodium chloride 0.45 % 1,000 mL infusion   Intravenous Continuous [name], MD 500 mL/hr at 04/04/15 1444    . sodium chloride 0.9 % injection 10 mL  10 mL Intravenous PRN [name], MD   10 mL at 04/04/15 1440  . sodium chloride 0.9 % injection 10 mL  10 mL Intravenous PRN Cammie Sickle, MD   10 mL at 09/26/15 1350  . trastuzumab (HERCEPTIN) 420 mg in sodium chloride 0.9 % 250 mL chemo infusion  6 mg/kg (Treatment Plan Actual) Intravenous Once Evlyn Kanner, NP 540 mL/hr at 09/26/15 1528 420 mg at 09/26/15 1528    OBJECTIVE: BP 115/80 mmHg  Pulse 103  Temp(Src) 95.7 F (35.4 C) (Tympanic)   Resp 18  Wt 153 lb (69.4 kg)   Body mass index is 24.71 kg/(m^2).    ECOG FS:0 - Asymptomatic  General: Well-developed, well-nourished, no acute distress. Eyes: Pink conjunctiva, anicteric sclera. HEENT: Normocephalic, moist mucous membranes, clear oropharnyx. Lungs: Clear to auscultation bilaterally. Heart: Regular rate and rhythm. No rubs, murmurs, or gallops. Abdomen: Soft, nontender, nondistended. No organomegaly noted, normoactive bowel sounds. Musculoskeletal: No edema, cyanosis, or clubbing. Neuro: Alert, answering all questions appropriately. Cranial nerves grossly intact. Skin: No rashes or petechiae noted. Psych: Normal affect. Lymphatics: No cervical, clavicular, axillary LAD.   LAB RESULTS:  Infusion on 09/26/2015  Component Date Value Ref Range Status  . WBC 09/26/2015 3.7  3.6 - 11.0 K/uL Final  . RBC 09/26/2015 4.53  3.80 - 5.20 MIL/uL Final  . Hemoglobin 09/26/2015 13.6  12.0 - 16.0 g/dL Final  . HCT 09/26/2015 40.5  35.0 - 47.0 % Final  . MCV 09/26/2015 89.3  80.0 - 100.0 fL Final  . MCH 09/26/2015 29.9  26.0 - 34.0  pg Final  . MCHC 09/26/2015 33.5  32.0 - 36.0 g/dL Final  . RDW 09/26/2015 15.8* 11.5 - 14.5 % Final  . Platelets 09/26/2015 216  150 - 440 K/uL Final  . Neutrophils Relative % 09/26/2015 53   Final  . Neutro Abs 09/26/2015 1.9  1.4 - 6.5 K/uL Final  . Lymphocytes Relative 09/26/2015 36   Final  . Lymphs Abs 09/26/2015 1.3  1.0 - 3.6 K/uL Final  . Monocytes Relative 09/26/2015 6   Final  . Monocytes Absolute 09/26/2015 0.2  0.2 - 0.9 K/uL Final  . Eosinophils Relative 09/26/2015 4   Final  . Eosinophils Absolute 09/26/2015 0.1  0 - 0.7 K/uL Final  . Basophils Relative 09/26/2015 1   Final  . Basophils Absolute 09/26/2015 0.0  0 - 0.1 K/uL Final  . Sodium 09/26/2015 134* 135 - 145 mmol/L Final  . Potassium 09/26/2015 4.0  3.5 - 5.1 mmol/L Final  . Chloride 09/26/2015 101  101 - 111 mmol/L Final  . CO2 09/26/2015 25  22 - 32 mmol/L Final  .  Glucose, Bld 09/26/2015 130* 65 - 99 mg/dL Final  . BUN 09/26/2015 26* 6 - 20 mg/dL Final  . Creatinine, Ser 09/26/2015 0.77  0.44 - 1.00 mg/dL Final  . Calcium 09/26/2015 8.9  8.9 - 10.3 mg/dL Final  . Total Protein 09/26/2015 7.3  6.5 - 8.1 g/dL Final  . Albumin 09/26/2015 4.0  3.5 - 5.0 g/dL Final  . AST 09/26/2015 23  15 - 41 U/L Final  . ALT 09/26/2015 22  14 - 54 U/L Final  . Alkaline Phosphatase 09/26/2015 59  38 - 126 U/L Final  . Total Bilirubin 09/26/2015 0.7  0.3 - 1.2 mg/dL Final  . GFR calc non Af Amer 09/26/2015 >60  >60 mL/min Final  . GFR calc Af Amer 09/26/2015 >60  >60 mL/min Final   Comment: (NOTE) The eGFR has been calculated using the CKD EPI equation. This calculation has not been validated in all clinical situations. eGFR's persistently <60 mL/min signify possible Chronic Kidney Disease.   . Anion gap 09/26/2015 8  5 - 15 Final    STUDIES: No results found.  ASSESSMENT:  Carcinoma of left breast.  PLAN:   1. Breast Ca, left. Stage II, ER/ PR positive, Her 2 Neu negative. She is currently tolerating adjuvant treatment very well. MUGA scan performed on 09/16/2015 and showed some decrease in EF, from 63% to 51%. As patient is having no complaints of lower extremity edema, sob, cough, we will proceed with Herceptin as previously scheduled.  2. Peripheral Neuropathy. Grade 2. Related to previous treatment with Taxotere. Discussed regular use of Neurontin TID as well as increasing her dose. She is currently taking 266m TID.   Will continue with next Herceptin in 3 weeks and with next physician follow up in 6 weeks.    Patient expressed understanding and was in agreement with this plan. She also understands that She can call clinic at any time with any questions, concerns, or complaints.   Dr. FGrayland Ormondwas available for consultation and review of plan of care for this patient.   LEvlyn Kanner NP   09/26/2015 3:33 PM

## 2015-09-26 NOTE — Progress Notes (Signed)
Per Georgeanne Nim , NP patient may receive '25mg'$  of benadryl.  Verbal order read back and confirmed

## 2015-10-02 ENCOUNTER — Ambulatory Visit
Admission: RE | Admit: 2015-10-02 | Discharge: 2015-10-02 | Disposition: A | Discharge status: Home / Self Care | Payer: BLUE CROSS/BLUE SHIELD | Source: Ambulatory Visit | Attending: Radiation Oncology | Admitting: Radiation Oncology

## 2015-10-02 ENCOUNTER — Encounter: Payer: Self-pay | Admitting: Radiation Oncology

## 2015-10-02 ENCOUNTER — Telehealth: Payer: Self-pay | Admitting: Internal Medicine

## 2015-10-02 VITALS — BP 125/86 | HR 102 | Temp 97.8°F | Resp 18 | Wt 156.0 lb

## 2015-10-02 DIAGNOSIS — C50419 Malignant neoplasm of upper-outer quadrant of unspecified female breast: Secondary | ICD-10-CM

## 2015-10-02 NOTE — Telephone Encounter (Signed)
Spoke with patient. She would like to discuss the MUGA scans with md face to face.  She does not want any further herceptin txs until she discusses the muga with Dr. Rogue Bussing. She declined any further discussion with Triage RN.

## 2015-10-02 NOTE — Telephone Encounter (Signed)
Patient would like to have MD appt on the 10th to discuss herceptin. She is supposed to start it on the 19th but has questions. She has been tentatively added to the schedule on the 10th at 3pm but if you want to call her it is possible her questions might be answered on the phone and might not necessitate an office visit. Thanks.

## 2015-10-02 NOTE — Progress Notes (Signed)
Radiation Oncology Follow up Note  Name: Sarah Horne   Date:   10/02/2015 MRN:  [id number] DOB: July 12, 1956    This 60 y.o. female presents to the clinic today for follow-up or breast cancer stage II (T2 N1 M0) triple positive status post new adjuvant chemotherapy Herceptin and whole breast and peripheral lymphatic radiation to her left breast now 1 month out.  REFERRING PROVIDER: [name], MD  HPI: Patient is a 60 year old female now 1 month out of completing radiation therapy to her left breast and peripheral lymphatics status post wide local excision for initial size 4.3 cm lesion with a 2.7 cm left axillary lymph node which was positive for poorly differentiated carcinoma ER/PR positive HER-2/neu overexpressed. She underwent neoadjuvant chemotherapy followed by wide local excision and then the whole breast and peripheral lymphatic radiation. She seen today in routine follow-up one month out is doing well continues on Herceptin maintenance. She's not yet started aromatase inhibitor therapy. She specifically denies breast tenderness cough or bone pain. She does have some numbness in the site of excision which we would expect at this time. She does not complain of any swelling of her left upper extremity..  COMPLICATIONS OF TREATMENT: none  FOLLOW UP COMPLIANCE: keeps appointments   PHYSICAL EXAM:  BP 125/86 mmHg  Pulse 102  Temp(Src) 97.8 F (36.6 C)  Resp 18  Wt 155 lb 15.6 oz (70.75 kg) She is status post wide local excision of the left breast. She's also had a biopsy of the right breast both incisions are well-healed. There is some retraction superiorly towards the scar which is of a more horizontal origin in the left breast. No dominant mass or nodularity is noted in either breast except for lumpectomy cavities which are firm. No axillary or supra clavicular adenopathy is identified bilaterally. No evidence of lymphedema of left upper extremity is noted. Well-developed  well-nourished patient in NAD. HEENT reveals PERLA, EOMI, discs not visualized.  Oral cavity is clear. No oral mucosal lesions are identified. Neck is clear without evidence of cervical or supraclavicular adenopathy. Lungs are clear to A&P. Cardiac examination is essentially unremarkable with regular rate and rhythm without murmur rub or thrill. Abdomen is benign with no organomegaly or masses noted. Motor sensory and DTR levels are equal and symmetric in the upper and lower extremities. Cranial nerves II through XII are grossly intact. Proprioception is intact. No peripheral adenopathy or edema is identified. No motor or sensory levels are noted. Crude visual fields are within normal range.  RADIOLOGY RESULTS: Patient is recent had a MUGA scan showing a 12 point drop in her ejection fraction will probably continue Herceptin therapy by medical oncology's order.  PLAN: At the present time she is doing well. She continues on Herceptin maintenance. She has had a drop in her ejection fraction by MUGA scan. I've also question when they will start her aromatase inhibitor therapy and she will asked medical oncology that are their next meeting. Otherwise I'm please were overall progress I've asked to see her out 4-5 months for follow-up. She knows to call sooner with any concerns.  I would like to take this opportunity for allowing me to participate in the care of your patient.Armstead Peaks., MD

## 2015-10-08 ENCOUNTER — Inpatient Hospital Stay: Payer: BLUE CROSS/BLUE SHIELD | Attending: Internal Medicine | Admitting: Internal Medicine

## 2015-10-08 VITALS — BP 137/79 | HR 111 | Temp 97.6°F | Wt 157.8 lb

## 2015-10-08 DIAGNOSIS — F17210 Nicotine dependence, cigarettes, uncomplicated: Secondary | ICD-10-CM | POA: Insufficient documentation

## 2015-10-08 DIAGNOSIS — C50412 Malignant neoplasm of upper-outer quadrant of left female breast: Secondary | ICD-10-CM | POA: Insufficient documentation

## 2015-10-08 DIAGNOSIS — G620 Drug-induced polyneuropathy: Secondary | ICD-10-CM | POA: Diagnosis not present

## 2015-10-08 DIAGNOSIS — T451X5S Adverse effect of antineoplastic and immunosuppressive drugs, sequela: Secondary | ICD-10-CM | POA: Insufficient documentation

## 2015-10-08 DIAGNOSIS — I252 Old myocardial infarction: Secondary | ICD-10-CM | POA: Diagnosis not present

## 2015-10-08 DIAGNOSIS — M810 Age-related osteoporosis without current pathological fracture: Secondary | ICD-10-CM

## 2015-10-08 DIAGNOSIS — Z9221 Personal history of antineoplastic chemotherapy: Secondary | ICD-10-CM | POA: Diagnosis not present

## 2015-10-08 DIAGNOSIS — Z803 Family history of malignant neoplasm of breast: Secondary | ICD-10-CM | POA: Insufficient documentation

## 2015-10-08 DIAGNOSIS — Z7982 Long term (current) use of aspirin: Secondary | ICD-10-CM | POA: Diagnosis not present

## 2015-10-08 DIAGNOSIS — C773 Secondary and unspecified malignant neoplasm of axilla and upper limb lymph nodes: Secondary | ICD-10-CM

## 2015-10-08 DIAGNOSIS — Z808 Family history of malignant neoplasm of other organs or systems: Secondary | ICD-10-CM | POA: Diagnosis not present

## 2015-10-08 DIAGNOSIS — Z170 Estrogen receptor positive status [ER+]: Secondary | ICD-10-CM | POA: Diagnosis not present

## 2015-10-08 DIAGNOSIS — Z79811 Long term (current) use of aromatase inhibitors: Secondary | ICD-10-CM | POA: Diagnosis not present

## 2015-10-08 DIAGNOSIS — I10 Essential (primary) hypertension: Secondary | ICD-10-CM | POA: Diagnosis not present

## 2015-10-08 DIAGNOSIS — C50312 Malignant neoplasm of lower-inner quadrant of left female breast: Secondary | ICD-10-CM

## 2015-10-08 DIAGNOSIS — E785 Hyperlipidemia, unspecified: Secondary | ICD-10-CM | POA: Diagnosis not present

## 2015-10-08 DIAGNOSIS — Z79899 Other long term (current) drug therapy: Secondary | ICD-10-CM | POA: Diagnosis not present

## 2015-10-08 MED ORDER — ANASTROZOLE 1 MG PO TABS: 1.0000 mg | ORAL_TABLET | Freq: Every day | ORAL | Status: DC

## 2015-10-08 NOTE — Progress Notes (Signed)
[facility] OFFICE PROGRESS NOTE  Patient Care Team: [name], MD as PCP - General (Family Medicine) [name], MD (General Surgery) [name], MD as Attending Physician (Internal Medicine)   SUMMARY OF ONCOLOGIC HISTORY:  # LEFT BREAST IDC; STAGE II [cT2N1] ER >90%; PR- 50-90%; her 2 NEU- POS; s/p Neoadj chemo; AUG 2016- TCH+P s/p Lumpec & partial ALND- path CR; s/p RT;  adj Herceptin; HELD for Jan 19th 2017 [in DEC 2016-EF dropped from 63 to 51%]; JAN 2016 START Arimidex  # PN- G-2   # April 2016- Liver Bx- NEG   INTERVAL HISTORY:  A very pleasant 60 year old female patient with above history of stage II ER/PR positive HER-2/neu positive breast cancer currently on adjuvant/maintenance Herceptin is here for follow-up.  Patient is concerned about drop-of  Her ejection fraction from 63% in July  To 51% in December 2016.  She did get Herceptin on  December 29.  She is reluctant/ concerned to proceed with Herceptin  Which is due again on  January 19.   Patient continues to deny any  Shortness of breath or swelling in the legs. Denies any chest pain or  Cough. Patient complains of significant tingling and numbness of feet; and also the hands. She denies any falls. But admits to a burning pain. She is on Neurontin 200 mg 3 times a day.   REVIEW OF SYSTEMS:  A complete 10 point review of system is done which is negative except mentioned above/history of present illness.   PAST MEDICAL HISTORY :  Past Medical History  Diagnosis Date  . Hypertension   . Hyperlipidemia   . Heart attack ([place]) 1998  . Cancer ([place]) 2016    Stage II, ER positive, PR positive, HER-2/neu overexpressing of the left breast.  . Neuropathy ([place])     PAST SURGICAL HISTORY :   Past Surgical History  Procedure Laterality Date  . Portacath placement Right 12-31-14    Dr Bary Castilla  . Breast lumpectomy with sentinel lymph node biopsy Left 05/23/2015    Procedure: LEFT BREAST WIDE  EXCISION WITH AXILLARY DISSECTION, MASTOPLASTY ;  Surgeon: [name], MD;  Location: [facility] ORS;  Service: General;  Laterality: Left;  . Breast surgery Left 12/18/14    breast biopsy/INVASIVE DUCTAL CARCINOMA OF BREAST, NOTTINGHAM GRADE 2.  . Breast surgery  05/23/2015.    Wide excision/mastoplasty, axillary dissection. No residual invasive cancer, positive for residual DCIS. 0/2 nodes identified on axillary dissection. (no SLN by technetium or methylene blue)    FAMILY HISTORY :   Family History  Problem Relation Age of Onset  . Breast cancer Maternal Aunt   . Breast cancer Cousin   . Brain cancer Maternal Uncle     SOCIAL HISTORY:   Social History  Substance Use Topics  . Smoking status: Current Every Day Smoker -- 0.50 packs/day for 18 years    Types: Cigarettes  . Smokeless tobacco: Not on file  . Alcohol Use: No    ALLERGIES:  is allergic to no known allergies.  MEDICATIONS:  Current Outpatient Prescriptions  Medication Sig Dispense Refill  . aspirin 81 MG tablet Take 81 mg by mouth daily.    Marland Kitchen atenolol (TENORMIN) 25 MG tablet Take 1 tablet (25 mg total) by mouth daily. 90 tablet 0  . gabapentin (NEURONTIN) 100 MG capsule Take 2 capsules (200 mg total) by mouth 3 (three) times daily. (Patient taking differently: Take 200 mg by mouth 3 (three) times  daily. ) 180 capsule 3  . lisinopril (PRINIVIL,ZESTRIL) 10 MG tablet Take 1 tablet (10 mg total) by mouth daily. 90 tablet 0  . simvastatin (ZOCOR) 40 MG tablet Take 1 tablet (40 mg total) by mouth daily at 6 PM. 14 tablet 0  . anastrozole (ARIMIDEX) 1 MG tablet Take 1 tablet (1 mg total) by mouth daily. 90 tablet 3  . furosemide (LASIX) 20 MG tablet Take 1 tablet orally once daily as needed for swelling. (Patient not taking: Reported on 10/08/2015) 30 tablet 0   No current facility-administered medications for this visit.   Facility-Administered Medications Ordered in Other Visits  Medication Dose Route Frequency Provider  Last Rate Last Dose  . 0.9 %  sodium chloride infusion   Intravenous Continuous [name], MD      . 0.9 %  sodium chloride infusion   Intravenous Continuous Lloyd Huger, MD 999 mL/hr at 04/24/15 1520    . sodium chloride 0.45 % 1,000 mL infusion   Intravenous Continuous [name], MD 500 mL/hr at 04/04/15 1444    . sodium chloride 0.9 % injection 10 mL  10 mL Intravenous PRN [name], MD   10 mL at 04/04/15 1440    PHYSICAL EXAMINATION: ECOG PERFORMANCE STATUS: 0 - Asymptomatic  BP 137/79 mmHg  Pulse 111  Temp(Src) 97.6 F (36.4 C) (Tympanic)  Wt 157 lb 13.6 oz (71.6 kg)  Filed Weights   10/08/15 1524  Weight: 157 lb 13.6 oz (71.6 kg)    GENERAL: Well-nourished well-developed; Alert, no distress and comfortable.  Alone. EYES: no pallor or icterus OROPHARYNX: no thrush or ulceration; good dentition  NECK: supple, no masses felt LYMPH:  no palpable lymphadenopathy in the cervical, axillary or inguinal regions LUNGS: clear to auscultation and  No wheeze or crackles HEART/CVS: regular rate & rhythm and no murmurs; No lower extremity edema ABDOMEN:abdomen soft, non-tender and normal bowel sounds Musculoskeletal:no cyanosis of digits and no clubbing  PSYCH: alert & oriented x 3 with fluent speech NEURO: no focal motor/sensory deficits   LABORATORY DATA:  I have reviewed the data as listed    Component Value Date/Time   NA 134* 09/26/2015 1336   NA 135 01/08/2015 0850   K 4.0 09/26/2015 1336   K 3.7 01/08/2015 0850   CL 101 09/26/2015 1336   CL 101 01/08/2015 0850   CO2 25 09/26/2015 1336   CO2 26 01/08/2015 0850   GLUCOSE 130* 09/26/2015 1336   GLUCOSE 161* 01/08/2015 0850   BUN 26* 09/26/2015 1336   BUN 17 01/08/2015 0850   CREATININE 0.77 09/26/2015 1336   CREATININE 0.82 01/22/2015 1559   CALCIUM 8.9 09/26/2015 1336   CALCIUM 9.3 01/08/2015 0850   PROT 7.3 09/26/2015 1336   PROT 6.8 01/22/2015 1559   ALBUMIN 4.0 09/26/2015 1336   ALBUMIN  3.9 01/22/2015 1559   AST 23 09/26/2015 1336   AST 34 01/22/2015 1559   ALT 22 09/26/2015 1336   ALT 42 01/22/2015 1559   ALKPHOS 59 09/26/2015 1336   ALKPHOS 157* 01/22/2015 1559   BILITOT 0.7 09/26/2015 1336   BILITOT 0.2* 01/22/2015 1559   GFRNONAA >60 09/26/2015 1336   GFRNONAA >60 01/22/2015 1559   GFRAA >60 09/26/2015 1336   GFRAA >60 01/22/2015 1559    No results found for: SPEP, UPEP  Lab Results  Component Value Date   WBC 3.7 09/26/2015   NEUTROABS 1.9 09/26/2015   HGB 13.6 09/26/2015   HCT 40.5 09/26/2015   MCV  89.3 09/26/2015   PLT 216 09/26/2015      Chemistry      Component Value Date/Time   NA 134* 09/26/2015 1336   NA 135 01/08/2015 0850   K 4.0 09/26/2015 1336   K 3.7 01/08/2015 0850   CL 101 09/26/2015 1336   CL 101 01/08/2015 0850   CO2 25 09/26/2015 1336   CO2 26 01/08/2015 0850   BUN 26* 09/26/2015 1336   BUN 17 01/08/2015 0850   CREATININE 0.77 09/26/2015 1336   CREATININE 0.82 01/22/2015 1559      Component Value Date/Time   CALCIUM 8.9 09/26/2015 1336   CALCIUM 9.3 01/08/2015 0850   ALKPHOS 59 09/26/2015 1336   ALKPHOS 157* 01/22/2015 1559   AST 23 09/26/2015 1336   AST 34 01/22/2015 1559   ALT 22 09/26/2015 1336   ALT 42 01/22/2015 1559   BILITOT 0.7 09/26/2015 1336   BILITOT 0.2* 01/22/2015 1559       RADIOGRAPHIC STUDIES: I have personally reviewed the radiological images as listed and agreed with the findings in the report. No results found.   ASSESSMENT & PLAN:   # STAGE II LEFT BREAST CA ER/PR-POS; Her 2 NEU- POS- currently on adjuvant/maintenance Herceptin. Patient tolerating treatments well;  However ejection fraction dropped  From 63% in July to  51% in December 2016.  Given the patient's concern/ reluctance-  Hold Herceptin for the next  2 cycles.  Tentatively plan Herceptin on March 2;  Plan MUGA scan few days prior. She agrees.  I spoke to pharmacy-  Patient has received so for 66 mg per kilogram of the planned total  1 10 mg/kg Herceptin.  # PERIPHERAL NEUROPATHY- grade 2- from prior Taxotere chemotherapy. Recommend increasing the Neurontin dose to 600 mg 3 times a day.  #  Discussed the rationale for adjuvant aromatase inhibitor. Discussed the potential side effects- including but not limited to  Hot flashes,  Osteoporosis and joint pains. Recommend calcium and vitamin D pills twice a day. Also will get a bone density test prior to the next visit.  # Follow-up  On March 2/ tentativeHerceptin M.D. Visit./Labs.  # 25 minutes face-to-face with the patient discussing the above plan of care; more than 50% of time spent on prognosis/ natural history; counseling and coordination.    Orders Placed This Encounter  Procedures  . NM Cardiac Muga Rest    Standing Status: Future     Number of Occurrences:      Standing Expiration Date: 11/25/2015    Order Specific Question:  Reason for Exam (SYMPTOM  OR DIAGNOSIS REQUIRED)    Answer:  hx of breast cancer; on cariotaoxic drug- herceptin    Order Specific Question:  Preferred imaging location?    Answer:  [facility]    Order Specific Question:  If indicated for the ordered procedure, I authorize the administration of a radiopharmaceutical per Radiology protocol    Answer:  Yes  . DG Bone Density    Standing Status: Future     Number of Occurrences:      Standing Expiration Date: 10/07/2016    Order Specific Question:  Reason for Exam (SYMPTOM  OR DIAGNOSIS REQUIRED)    Answer:  osteoporosis    Order Specific Question:  Preferred imaging location?    Answer:  [facility]   All questions were answered. The patient knows to call the clinic with any problems, questions or concerns. No barriers to learning was detected.  I spent  25 minutes counseling the patient face to face. The total time spent in the appointment was 30 minutes and more than 50% was on counseling and review of test results     Cammie Sickle, MD 10/08/2015 4:17 PM

## 2015-10-09 ENCOUNTER — Telehealth: Payer: Self-pay | Admitting: Family Medicine

## 2015-10-09 NOTE — Telephone Encounter (Signed)
Requesting refill on simvastatin. [facility] and Please send [facility] supply to [facility]. Patient has one pill left. Please call patient once complete

## 2015-10-10 MED ORDER — SIMVASTATIN 40 MG PO TABS: 40.0000 mg | ORAL_TABLET | Freq: Every day | ORAL | Status: DC

## 2015-10-10 NOTE — Telephone Encounter (Signed)
Medication has been refilled and sent to [facility] 

## 2015-10-15 ENCOUNTER — Ambulatory Visit
Admission: RE | Admit: 2015-10-15 | Discharge: 2015-10-15 | Disposition: A | Discharge status: Home / Self Care | Payer: BLUE CROSS/BLUE SHIELD | Source: Ambulatory Visit | Attending: Internal Medicine | Admitting: Internal Medicine

## 2015-10-15 DIAGNOSIS — M810 Age-related osteoporosis without current pathological fracture: Secondary | ICD-10-CM

## 2015-10-15 DIAGNOSIS — Z780 Asymptomatic menopausal state: Secondary | ICD-10-CM | POA: Insufficient documentation

## 2015-10-15 DIAGNOSIS — C50312 Malignant neoplasm of lower-inner quadrant of left female breast: Secondary | ICD-10-CM | POA: Insufficient documentation

## 2015-10-17 ENCOUNTER — Ambulatory Visit: Payer: BLUE CROSS/BLUE SHIELD

## 2015-10-21 ENCOUNTER — Ambulatory Visit: Payer: BLUE CROSS/BLUE SHIELD | Admitting: Family Medicine

## 2015-10-21 ENCOUNTER — Other Ambulatory Visit: Payer: Self-pay

## 2015-10-21 DIAGNOSIS — I10 Essential (primary) hypertension: Secondary | ICD-10-CM

## 2015-10-21 MED ORDER — SIMVASTATIN 40 MG PO TABS: 40.0000 mg | ORAL_TABLET | Freq: Every day | ORAL | Status: DC

## 2015-10-21 MED ORDER — ATENOLOL 25 MG PO TABS: 25.0000 mg | ORAL_TABLET | Freq: Every day | ORAL | Status: DC

## 2015-10-21 MED ORDER — LISINOPRIL 10 MG PO TABS: 10.0000 mg | ORAL_TABLET | Freq: Every day | ORAL | Status: DC

## 2015-10-21 NOTE — Telephone Encounter (Signed)
Med[facility] has [facility]

## 2015-11-05 ENCOUNTER — Ambulatory Visit: Payer: BLUE CROSS/BLUE SHIELD | Admitting: Family Medicine

## 2015-11-07 ENCOUNTER — Ambulatory Visit: Payer: BLUE CROSS/BLUE SHIELD | Admitting: Internal Medicine

## 2015-11-07 ENCOUNTER — Ambulatory Visit: Payer: BLUE CROSS/BLUE SHIELD

## 2015-11-07 ENCOUNTER — Ambulatory Visit (INDEPENDENT_AMBULATORY_CARE_PROVIDER_SITE_OTHER): Payer: BLUE CROSS/BLUE SHIELD | Admitting: Family Medicine

## 2015-11-07 ENCOUNTER — Inpatient Hospital Stay: Payer: BLUE CROSS/BLUE SHIELD | Attending: Internal Medicine

## 2015-11-07 ENCOUNTER — Encounter: Payer: Self-pay | Admitting: Family Medicine

## 2015-11-07 ENCOUNTER — Other Ambulatory Visit: Payer: BLUE CROSS/BLUE SHIELD

## 2015-11-07 VITALS — BP 122/74 | HR 109 | Temp 98.5°F | Resp 16 | Ht 66.0 in | Wt 160.3 lb

## 2015-11-07 DIAGNOSIS — I10 Essential (primary) hypertension: Secondary | ICD-10-CM

## 2015-11-07 MED ORDER — SODIUM CHLORIDE 0.9% FLUSH
10.0000 mL | INTRAVENOUS | Status: AC | PRN
  Filled 2015-11-07: qty 10

## 2015-11-07 MED ORDER — HEPARIN SOD (PORK) LOCK FLUSH 100 UNIT/ML IV SOLN
500.0000 [IU] | Freq: Once | INTRAVENOUS | Status: AC
  Filled 2015-11-07: qty 5

## 2015-11-07 NOTE — Progress Notes (Signed)
Name: Sarah Horne   MRN: [id number]    DOB: 1956/02/12   Date:11/07/2015       Progress Note  Subjective  Chief Complaint  Chief Complaint  Patient presents with  . Medication Refill  . Hypertension  . Hyperlipidemia    Hypertension This is a chronic problem. The problem is controlled. Pertinent negatives include no blurred vision, chest pain, headaches or palpitations. Past treatments include ACE inhibitors and beta blockers. There are no compliance problems.  Hypertensive end-organ damage includes CAD/MI. There is no history of kidney disease or CVA.     Past Medical History  Diagnosis Date  . Hypertension   . Hyperlipidemia   . Heart attack ([place]) 1998  . Cancer ([place]) 2016    Stage II, ER positive, PR positive, HER-2/neu overexpressing of the left breast.  . Neuropathy [facility])     Past Surgical History  Procedure Laterality Date  . Portacath placement Right 12-31-14    Dr Bary Castilla  . Breast lumpectomy with sentinel lymph node biopsy Left 05/23/2015    Procedure: LEFT BREAST WIDE EXCISION WITH AXILLARY DISSECTION, MASTOPLASTY ;  Surgeon: [name], MD;  Location: [facility] ORS;  Service: General;  Laterality: Left;  . Breast surgery Left 12/18/14    breast biopsy/INVASIVE DUCTAL CARCINOMA OF BREAST, NOTTINGHAM GRADE 2.  . Breast surgery  05/23/2015.    Wide excision/mastoplasty, axillary dissection. No residual invasive cancer, positive for residual DCIS. 0/2 nodes identified on axillary dissection. (no SLN by technetium or methylene blue)    Family History  Problem Relation Age of Onset  . Breast cancer Maternal Aunt   . Breast cancer Cousin   . Brain cancer Maternal Uncle     Social History   Social History  . Marital Status: Divorced    Spouse Name: N/A  . Number of Children: N/A  . Years of Education: N/A   Occupational History  . Not on file.   Social History Main Topics  . Smoking status: Current Every Day Smoker -- 0.50 packs/day for 18 years   Types: Cigarettes  . Smokeless tobacco: Not on file  . Alcohol Use: No  . Drug Use: No  . Sexual Activity: Not on file   Other Topics Concern  . Not on file   Social History Narrative     Current outpatient prescriptions:  .  anastrozole (ARIMIDEX) 1 MG tablet, Take 1 tablet (1 mg total) by mouth daily., Disp: 90 tablet, Rfl: 3 .  aspirin 81 MG tablet, Take 81 mg by mouth daily., Disp: , Rfl:  .  atenolol (TENORMIN) 25 MG tablet, Take 1 tablet (25 mg total) by mouth daily., Disp: 90 tablet, Rfl: 0 .  gabapentin (NEURONTIN) 100 MG capsule, Take 2 capsules (200 mg total) by mouth 3 (three) times daily. (Patient taking differently: Take 200 mg by mouth 3 (three) times daily. ), Disp: 180 capsule, Rfl: 3 .  lisinopril (PRINIVIL,ZESTRIL) 10 MG tablet, Take 1 tablet (10 mg total) by mouth daily., Disp: 90 tablet, Rfl: 0 .  simvastatin (ZOCOR) 40 MG tablet, Take 1 tablet (40 mg total) by mouth daily at 6 PM., Disp: 90 tablet, Rfl: 0 No current facility-administered medications for this visit.  Facility-Administered Medications Ordered in Other Visits:  .  0.9 %  sodium chloride infusion, , Intravenous, Continuous, [name], MD .  0.9 %  sodium chloride infusion, , Intravenous, Continuous, Lloyd Huger, MD, Last Rate: 999 mL/hr at 04/24/15 1520 .  heparin lock flush 100  unit/mL, 500 Units, Intravenous, Once, Dmitriy Berenzon, MD .  sodium chloride 0.45 % 1,000 mL infusion, , Intravenous, Continuous, [name], MD, Last Rate: 500 mL/hr at 04/04/15 1444 .  sodium chloride 0.9 % injection 10 mL, 10 mL, Intravenous, PRN, [name], MD, 10 mL at 04/04/15 1440 .  sodium chloride flush (NS) 0.9 % injection 10 mL, 10 mL, Intravenous, PRN, Roxana Hires, MD  Allergies  Allergen Reactions  . No Known Allergies      Review of Systems  Constitutional: Negative for fever and chills.  Eyes: Negative for blurred vision.  Cardiovascular: Negative for chest pain, palpitations  and leg swelling.  Neurological: Negative for headaches.    Objective  Filed Vitals:   11/07/15 1627  BP: 122/74  Pulse: 109  Temp: 98.5 F (36.9 C)  TempSrc: Oral  Resp: 16  Height: 5' 6"  (1.676 m)  Weight: 160 lb 4.8 oz (72.712 kg)  SpO2: 96%    Physical Exam  Constitutional: She is oriented to person, place, and time and well-developed, well-nourished, and in no distress.  HENT:  Head: Normocephalic and atraumatic.  Cardiovascular: Normal rate and regular rhythm.   Pulmonary/Chest: Effort normal and breath sounds normal.  Musculoskeletal: She exhibits no edema.  Neurological: She is alert and oriented to person, place, and time.  Nursing note and vitals reviewed.      Assessment & Plan  1. Essential hypertension BP at goal. Continue present therapy.     Radiah Lubinski Asad A. [facility] 11/07/2015 4:55 PM

## 2015-11-25 ENCOUNTER — Encounter
Admission: RE | Admit: 2015-11-25 | Discharge: 2015-11-25 | Disposition: A | Discharge status: Home / Self Care | Payer: BLUE CROSS/BLUE SHIELD | Source: Ambulatory Visit | Attending: Internal Medicine | Admitting: Internal Medicine

## 2015-11-25 DIAGNOSIS — C50312 Malignant neoplasm of lower-inner quadrant of left female breast: Secondary | ICD-10-CM | POA: Diagnosis not present

## 2015-11-25 DIAGNOSIS — M810 Age-related osteoporosis without current pathological fracture: Secondary | ICD-10-CM | POA: Diagnosis present

## 2015-11-25 MED ORDER — TECHNETIUM TC 99M-LABELED RED BLOOD CELLS IV KIT
20.3000 | PACK | Freq: Once | INTRAVENOUS | Status: AC | PRN
  Administered 2015-11-25: 20.3 via INTRAVENOUS

## 2015-11-28 ENCOUNTER — Inpatient Hospital Stay: Payer: BLUE CROSS/BLUE SHIELD

## 2015-11-28 ENCOUNTER — Inpatient Hospital Stay: Payer: BLUE CROSS/BLUE SHIELD | Attending: Internal Medicine | Admitting: Internal Medicine

## 2015-11-28 ENCOUNTER — Encounter: Payer: Self-pay | Admitting: Internal Medicine

## 2015-11-28 VITALS — BP 108/74 | HR 93 | Temp 97.9°F | Resp 18 | Ht 66.0 in | Wt 159.0 lb

## 2015-11-28 DIAGNOSIS — C773 Secondary and unspecified malignant neoplasm of axilla and upper limb lymph nodes: Secondary | ICD-10-CM | POA: Diagnosis not present

## 2015-11-28 DIAGNOSIS — Z9221 Personal history of antineoplastic chemotherapy: Secondary | ICD-10-CM | POA: Insufficient documentation

## 2015-11-28 DIAGNOSIS — Z803 Family history of malignant neoplasm of breast: Secondary | ICD-10-CM | POA: Diagnosis not present

## 2015-11-28 DIAGNOSIS — G620 Drug-induced polyneuropathy: Secondary | ICD-10-CM

## 2015-11-28 DIAGNOSIS — Z452 Encounter for adjustment and management of vascular access device: Secondary | ICD-10-CM | POA: Diagnosis not present

## 2015-11-28 DIAGNOSIS — I10 Essential (primary) hypertension: Secondary | ICD-10-CM

## 2015-11-28 DIAGNOSIS — Z7982 Long term (current) use of aspirin: Secondary | ICD-10-CM | POA: Diagnosis not present

## 2015-11-28 DIAGNOSIS — Z79811 Long term (current) use of aromatase inhibitors: Secondary | ICD-10-CM

## 2015-11-28 DIAGNOSIS — F17210 Nicotine dependence, cigarettes, uncomplicated: Secondary | ICD-10-CM | POA: Insufficient documentation

## 2015-11-28 DIAGNOSIS — C50412 Malignant neoplasm of upper-outer quadrant of left female breast: Secondary | ICD-10-CM | POA: Insufficient documentation

## 2015-11-28 DIAGNOSIS — Z79899 Other long term (current) drug therapy: Secondary | ICD-10-CM

## 2015-11-28 DIAGNOSIS — T451X5S Adverse effect of antineoplastic and immunosuppressive drugs, sequela: Secondary | ICD-10-CM | POA: Insufficient documentation

## 2015-11-28 DIAGNOSIS — M810 Age-related osteoporosis without current pathological fracture: Secondary | ICD-10-CM

## 2015-11-28 DIAGNOSIS — Z170 Estrogen receptor positive status [ER+]: Secondary | ICD-10-CM | POA: Diagnosis not present

## 2015-11-28 DIAGNOSIS — C50312 Malignant neoplasm of lower-inner quadrant of left female breast: Secondary | ICD-10-CM

## 2015-11-28 DIAGNOSIS — I252 Old myocardial infarction: Secondary | ICD-10-CM | POA: Diagnosis not present

## 2015-11-28 DIAGNOSIS — C50912 Malignant neoplasm of unspecified site of left female breast: Secondary | ICD-10-CM

## 2015-11-28 MED ORDER — ANASTROZOLE 1 MG PO TABS: 1.0000 mg | ORAL_TABLET | Freq: Every day | ORAL | Status: DC

## 2015-11-28 MED ORDER — PREGABALIN 75 MG PO CAPS: 75.0000 mg | ORAL_CAPSULE | Freq: Two times a day (BID) | ORAL | Status: DC

## 2015-11-28 NOTE — Progress Notes (Signed)
[facility] OFFICE PROGRESS NOTE  Patient Care Team: [name], MD as PCP - General (Family Medicine) [name], MD (General Surgery) [name], MD as Attending Physician (Internal Medicine)   SUMMARY OF ONCOLOGIC HISTORY:  # LEFT BREAST IDC; STAGE II [cT2N1] ER >90%; PR- 50-90%; her 2 NEU- POS; s/p Neoadj chemo; AUG 2016- TCH+P s/p Lumpec & partial ALND- path CR; s/p RT;  adj Herceptin; HELD for Jan 19th 2017 [in DEC 2016-EF dropped from 63 to 51%; FEB 27th EF-42.9%]; JAN 2016 START Arimidex  # PN- G-2   # April 2016- Liver Bx- NEG   INTERVAL HISTORY:  A very pleasant 60 year old female patient with above history of stage II ER/PR positive HER-2/neu positive breast cancer currently on adjuvant/maintenance Herceptin is here for follow-up.  Patient is concerned about drop-of  Her ejection fraction from 63% in July  To 51% in December 2016.  She did get Herceptin on  December 29. She is here to review the results of her MUGA scan.  Patient denies any shortness of breath. Denies any chest pain. Denies any swelling in the legs. No headaches vision changes or double vision.  She continues to complain of tingling and numbness of her hands and feet. She is on Neurontin 400 mg twice a day. No significant improvement noted.   REVIEW OF SYSTEMS:  A complete 10 point review of system is done which is negative except mentioned above/history of present illness.   PAST MEDICAL HISTORY :  Past Medical History  Diagnosis Date  . Hypertension   . Hyperlipidemia   . Heart attack ([place]) 1998  . Cancer ([place]) 2016    Stage II, ER positive, PR positive, HER-2/neu overexpressing of the left breast.  . Neuropathy ([place])   . Neuromuscular disorder ([place])     neuropathy    PAST SURGICAL HISTORY :   Past Surgical History  Procedure Laterality Date  . Portacath placement Right 12-31-14    Dr Bary Castilla  . Breast lumpectomy with sentinel lymph node biopsy Left 05/23/2015     Procedure: LEFT BREAST WIDE EXCISION WITH AXILLARY DISSECTION, MASTOPLASTY ;  Surgeon: [name], MD;  Location: [facility] ORS;  Service: General;  Laterality: Left;  . Breast surgery Left 12/18/14    breast biopsy/INVASIVE DUCTAL CARCINOMA OF BREAST, NOTTINGHAM GRADE 2.  . Breast surgery  05/23/2015.    Wide excision/mastoplasty, axillary dissection. No residual invasive cancer, positive for residual DCIS. 0/2 nodes identified on axillary dissection. (no SLN by technetium or methylene blue)    FAMILY HISTORY :   Family History  Problem Relation Age of Onset  . Breast cancer Maternal Aunt   . Breast cancer Cousin   . Brain cancer Maternal Uncle     SOCIAL HISTORY:   Social History  Substance Use Topics  . Smoking status: Current Every Day Smoker -- 0.50 packs/day for 18 years    Types: Cigarettes  . Smokeless tobacco: None  . Alcohol Use: No    ALLERGIES:  is allergic to no known allergies.  MEDICATIONS:  Current Outpatient Prescriptions  Medication Sig Dispense Refill  . anastrozole (ARIMIDEX) 1 MG tablet Take 1 tablet (1 mg total) by mouth daily. 90 tablet 3  . aspirin 81 MG tablet Take 81 mg by mouth daily.    Marland Kitchen atenolol (TENORMIN) 25 MG tablet Take 1 tablet (25 mg total) by mouth daily. 90 tablet 0  . gabapentin (NEURONTIN) 100 MG capsule Take 2 capsules (200 mg  total) by mouth 3 (three) times daily. (Patient taking differently: Take 200 mg by mouth 2 (two) times daily. ) 180 capsule 3  . lisinopril (PRINIVIL,ZESTRIL) 10 MG tablet Take 1 tablet (10 mg total) by mouth daily. 90 tablet 0  . simvastatin (ZOCOR) 40 MG tablet Take 1 tablet (40 mg total) by mouth daily at 6 PM. 90 tablet 0  . pregabalin (LYRICA) 75 MG capsule Take 1 capsule (75 mg total) by mouth 2 (two) times daily. 60 capsule 3   No current facility-administered medications for this visit.   Facility-Administered Medications Ordered in Other Visits  Medication Dose Route Frequency Provider Last Rate Last  Dose  . 0.9 %  sodium chloride infusion   Intravenous Continuous [name], MD      . 0.9 %  sodium chloride infusion   Intravenous Continuous Lloyd Huger, MD 999 mL/hr at 04/24/15 1520    . heparin lock flush 100 unit/mL  500 Units Intravenous Once Dmitriy Berenzon, MD      . sodium chloride 0.45 % 1,000 mL infusion   Intravenous Continuous [name], MD 500 mL/hr at 04/04/15 1444    . sodium chloride 0.9 % injection 10 mL  10 mL Intravenous PRN [name], MD   10 mL at 04/04/15 1440  . sodium chloride flush (NS) 0.9 % injection 10 mL  10 mL Intravenous PRN Dmitriy Berenzon, MD        PHYSICAL EXAMINATION: ECOG PERFORMANCE STATUS: 0 - Asymptomatic  BP 108/74 mmHg  Pulse 93  Temp(Src) 97.9 F (36.6 C) (Tympanic)  Resp 18  Ht 5' 6"  (1.676 m)  Wt 158 lb 15.2 oz (72.1 kg)  BMI 25.67 kg/m2  Filed Weights   11/28/15 1403  Weight: 158 lb 15.2 oz (72.1 kg)    GENERAL: Well-nourished well-developed; Alert, no distress and comfortable.  Alone. EYES: no pallor or icterus OROPHARYNX: no thrush or ulceration; good dentition  NECK: supple, no masses felt LYMPH:  no palpable lymphadenopathy in the cervical, axillary or inguinal regions LUNGS: clear to auscultation and  No wheeze or crackles HEART/CVS: regular rate & rhythm and no murmurs; No lower extremity edema ABDOMEN:abdomen soft, non-tender and normal bowel sounds Musculoskeletal:no cyanosis of digits and no clubbing  PSYCH: alert & oriented x 3 with fluent speech NEURO: no focal motor/sensory deficits   LABORATORY DATA:  I have reviewed the data as listed    Component Value Date/Time   NA 134* 09/26/2015 1336   NA 135 01/08/2015 0850   K 4.0 09/26/2015 1336   K 3.7 01/08/2015 0850   CL 101 09/26/2015 1336   CL 101 01/08/2015 0850   CO2 25 09/26/2015 1336   CO2 26 01/08/2015 0850   GLUCOSE 130* 09/26/2015 1336   GLUCOSE 161* 01/08/2015 0850   BUN 26* 09/26/2015 1336   BUN 17 01/08/2015 0850    CREATININE 0.77 09/26/2015 1336   CREATININE 0.82 01/22/2015 1559   CALCIUM 8.9 09/26/2015 1336   CALCIUM 9.3 01/08/2015 0850   PROT 7.3 09/26/2015 1336   PROT 6.8 01/22/2015 1559   ALBUMIN 4.0 09/26/2015 1336   ALBUMIN 3.9 01/22/2015 1559   AST 23 09/26/2015 1336   AST 34 01/22/2015 1559   ALT 22 09/26/2015 1336   ALT 42 01/22/2015 1559   ALKPHOS 59 09/26/2015 1336   ALKPHOS 157* 01/22/2015 1559   BILITOT 0.7 09/26/2015 1336   BILITOT 0.2* 01/22/2015 1559   GFRNONAA >60 09/26/2015 1336   GFRNONAA >60 01/22/2015 1559  GFRAA >60 09/26/2015 1336   GFRAA >60 01/22/2015 1559    No results found for: SPEP, UPEP  Lab Results  Component Value Date   WBC 3.7 09/26/2015   NEUTROABS 1.9 09/26/2015   HGB 13.6 09/26/2015   HCT 40.5 09/26/2015   MCV 89.3 09/26/2015   PLT 216 09/26/2015      Chemistry      Component Value Date/Time   NA 134* 09/26/2015 1336   NA 135 01/08/2015 0850   K 4.0 09/26/2015 1336   K 3.7 01/08/2015 0850   CL 101 09/26/2015 1336   CL 101 01/08/2015 0850   CO2 25 09/26/2015 1336   CO2 26 01/08/2015 0850   BUN 26* 09/26/2015 1336   BUN 17 01/08/2015 0850   CREATININE 0.77 09/26/2015 1336   CREATININE 0.82 01/22/2015 1559      Component Value Date/Time   CALCIUM 8.9 09/26/2015 1336   CALCIUM 9.3 01/08/2015 0850   ALKPHOS 59 09/26/2015 1336   ALKPHOS 157* 01/22/2015 1559   AST 23 09/26/2015 1336   AST 34 01/22/2015 1559   ALT 22 09/26/2015 1336   ALT 42 01/22/2015 1559   BILITOT 0.7 09/26/2015 1336   BILITOT 0.2* 01/22/2015 1559       ASSESSMENT & PLAN:   # STAGE II LEFT BREAST CA ER/PR-POS; Her 2 NEU- POS- currently on adjuvant/maintenance Herceptin. Patient tolerating treatments well;  However ejection fraction dropped  From 63% in July to  51% in December 2016. A repeat MUGA scan shows decreased ejection fraction of 43.9%. Last Herceptin Dec 29th 2016.   Patient has received so for 66 mg per kilogram of the planned total 110 mg/kg  Herceptin.  # I recommend to continue to hold Herceptin. Patient did not have anthracycline. It's unclear why patient's ejection fraction continues to drop. Reduced EF Herceptin is usually reversible/transient. I recommend a cardiology evaluation.   # PERIPHERAL NEUROPATHY- grade 2- from prior Taxotere chemotherapy. No improvement on Neurontin. Recommend Lyrica 75 mg twice a day. New prescription given.  # aromatase inhibitor- tolerating well without any major side effects. New prescription given.  # Patient will follow-up in 4 weeks  # 25 minutes face-to-face with the patient discussing the above plan of care; more than 50% of time spent on prognosis/ natural history; counseling and coordination.      Cammie Sickle, MD 11/28/2015 2:10 PM

## 2015-11-28 NOTE — Progress Notes (Signed)
Feels like numbness and tingling is no better but no worse and her PCP told her about maybe starting lyrica and she is tapering off gabapentin now.  She is eating and drinking without problems.  She is here to get cardiac results to know if she can start back on herceptin.

## 2015-11-29 ENCOUNTER — Encounter: Payer: Self-pay | Admitting: *Deleted

## 2015-11-29 ENCOUNTER — Telehealth: Payer: Self-pay | Admitting: *Deleted

## 2015-11-29 NOTE — Telephone Encounter (Signed)
11/29/15--Prior Josem Kaufmann initiated for lyrica 75 mg capsules (1 capsule twice daily) with [email or web address].  Pending  Insurance approval.  Clinical Key number:  [id number]

## 2015-12-03 ENCOUNTER — Encounter: Payer: Self-pay | Admitting: Cardiovascular Disease

## 2015-12-03 ENCOUNTER — Ambulatory Visit (INDEPENDENT_AMBULATORY_CARE_PROVIDER_SITE_OTHER): Payer: BLUE CROSS/BLUE SHIELD | Admitting: Cardiovascular Disease

## 2015-12-03 VITALS — BP 110/80 | HR 88 | Ht 66.0 in | Wt 157.5 lb

## 2015-12-03 DIAGNOSIS — I10 Essential (primary) hypertension: Secondary | ICD-10-CM

## 2015-12-03 DIAGNOSIS — R0602 Shortness of breath: Secondary | ICD-10-CM | POA: Diagnosis not present

## 2015-12-03 MED ORDER — CARVEDILOL 3.125 MG PO TABS: 3.1250 mg | ORAL_TABLET | Freq: Two times a day (BID) | ORAL | Status: DC

## 2015-12-03 NOTE — Progress Notes (Signed)
Cardiology Office Note   Date:  12/03/2015   ID:  Sarah Horne, DOB [birth year], MRN [id number]  PCP:  Keith Rake, MD  Cardiologist:   Kathlyn Sacramento, MD   Chief Complaint  Patient presents with  . other    Ref by Dr. Rogue Bussing for abnormal MUGA scan, patient was on chemotherapy last year for breast cancer.  Meds reviewed by the patient verbally. Denies chest pain or shortness of breath.       History of Present Illness: Sarah Horne is a 60 y.o. female who was referred by Dr.Brahmanday for evaluation of Herceptin-induced cardiomyopathy. The patient reports possible prior myocardial infarction in 1998 where she underwent cardiac catheterization at [facility]. She did not require revascularization and she was treated medically. The details of that admission are not available. She has chronic medical conditions that include hypertension, hyperlipidemia and tobacco use . She was diagnosed with stage II breast cancer last year. She started treatment with Taxotere/Carboplatin/Herceptin/Perjeta in April 2016. She underwent a MUGA scan in July which showed an ejection fraction of 63%. This dropped to 51% in December. Given that she was asymptomatic, Herceptin was given in December. She underwent a repeat MUGA scan on February 27 which showed a drop in ejection fraction to 44%. The patient remains asymptomatic with no chest pain, shortness of breath, orthopnea or PND. She did not receive anthracycline. She underwent lumpectomy in August.  She continues to smoke half a pack per day and has been doing so since she was 60 years old. She has no family history of coronary artery disease or congestive heart failure.  Past Medical History  Diagnosis Date  . Hypertension   . Hyperlipidemia   . Heart attack ([place]) 1998  . Cancer ([place]) 2016    Stage II, ER positive, PR positive, HER-2/neu overexpressing of the left breast.  . Neuropathy ([place])   . Neuromuscular disorder ([facility])     neuropathy  .  Chemotherapy-induced peripheral neuropathy [facility])     Past Surgical History  Procedure Laterality Date  . Portacath placement Right 12-31-14    Dr Bary Castilla  . Breast lumpectomy with sentinel lymph node biopsy Left 05/23/2015    Procedure: LEFT BREAST WIDE EXCISION WITH AXILLARY DISSECTION, MASTOPLASTY ;  Surgeon: [name], MD;  Location: [facility] ORS;  Service: General;  Laterality: Left;  . Breast surgery Left 12/18/14    breast biopsy/INVASIVE DUCTAL CARCINOMA OF BREAST, NOTTINGHAM GRADE 2.  . Breast surgery  05/23/2015.    Wide excision/mastoplasty, axillary dissection. No residual invasive cancer, positive for residual DCIS. 0/2 nodes identified on axillary dissection. (no SLN by technetium or methylene blue)  . Cardiac catheterization       Current Outpatient Prescriptions  Medication Sig Dispense Refill  . anastrozole (ARIMIDEX) 1 MG tablet Take 1 tablet (1 mg total) by mouth daily. 90 tablet 3  . aspirin 81 MG tablet Take 81 mg by mouth daily.    Marland Kitchen gabapentin (NEURONTIN) 100 MG capsule Take 2 capsules (200 mg total) by mouth 3 (three) times daily. (Patient taking differently: Take 200 mg by mouth 2 (two) times daily. ) 180 capsule 3  . lisinopril (PRINIVIL,ZESTRIL) 10 MG tablet Take 1 tablet (10 mg total) by mouth daily. 90 tablet 0  . pregabalin (LYRICA) 75 MG capsule Take 1 capsule (75 mg total) by mouth 2 (two) times daily. 60 capsule 3  . simvastatin (ZOCOR) 40 MG tablet Take 1 tablet (40 mg total) by mouth daily at 6  PM. 90 tablet 0  . carvedilol (COREG) 3.125 MG tablet Take 1 tablet (3.125 mg total) by mouth 2 (two) times daily. 60 tablet 3   No current facility-administered medications for this visit.   Facility-Administered Medications Ordered in Other Visits  Medication Dose Route Frequency Provider Last Rate Last Dose  . 0.9 %  sodium chloride infusion   Intravenous Continuous [name], MD      . 0.9 %  sodium chloride infusion   Intravenous Continuous Lloyd Huger, MD 999 mL/hr at 04/24/15 1520    . heparin lock flush 100 unit/mL  500 Units Intravenous Once Dmitriy Berenzon, MD      . sodium chloride 0.45 % 1,000 mL infusion   Intravenous Continuous [name], MD 500 mL/hr at 04/04/15 1444    . sodium chloride 0.9 % injection 10 mL  10 mL Intravenous PRN [name], MD   10 mL at 04/04/15 1440  . sodium chloride flush (NS) 0.9 % injection 10 mL  10 mL Intravenous PRN Roxana Hires, MD        Allergies:   No known allergies    Social History:  The patient  reports that she has been smoking Cigarettes.  She has a 9 pack-year smoking history. She does not have any smokeless tobacco history on file. She reports that she does not drink alcohol or use illicit drugs.   Family History:  The patient's family history includes Brain cancer in her maternal uncle; Breast cancer in her cousin and maternal aunt.    ROS:  Please see the history of present illness.   Otherwise, review of systems are positive for none.   All other systems are reviewed and negative.    PHYSICAL EXAM: VS:  BP 110/80 mmHg  Pulse 88  Ht 5' 6"  (1.676 m)  Wt 157 lb 8 oz (71.442 kg)  BMI 25.43 kg/m2 , BMI Body mass index is 25.43 kg/(m^2). GEN: Well nourished, well developed, in no acute distress HEENT: normal Neck: no JVD, carotid bruits, or masses Cardiac: RRR; no murmurs, rubs, or gallops,no edema  Respiratory:  clear to auscultation bilaterally, normal work of breathing GI: soft, nontender, nondistended, + BS MS: no deformity or atrophy Skin: warm and dry, no rash Neuro:  Strength and sensation are intact Psych: euthymic mood, full affect   EKG:  EKG is ordered today. The ekg ordered today demonstrates sinus rhythm with sinus arrhythmia and first-degree AV block. No significant ST changes.  Recent Labs: 09/26/2015: ALT 22; BUN 26*; Creatinine, Ser 0.77; Hemoglobin 13.6; Platelets 216; Potassium 4.0; Sodium 134*    Lipid Panel No results found for:  CHOL, TRIG, HDL, CHOLHDL, VLDL, LDLCALC, LDLDIRECT    Wt Readings from Last 3 Encounters:  12/03/15 157 lb 8 oz (71.442 kg)  11/28/15 158 lb 15.2 oz (72.1 kg)  11/07/15 160 lb 4.8 oz (72.712 kg)         ASSESSMENT AND PLAN:  1.  Chemotherapy induced cardiomyopathy: I explained to her that 90% of Herceptin associated cardiomyopathy are reversible. She has not received Herceptin since December and ejection fraction further dropped to 44%. I requested an echocardiogram to evaluate for other etiologies. Fortunately, she has no symptoms suggestive of heart failure she appears to be completely asymptomatic. I elected to switch her from atenolol to carvedilol. She is already on lisinopril. After reviewing echocardiogram, we can consider further  ischemic cardiac evaluation.  2. Essential hypertension: Blood pressure is controlled.  3. Hyperlipidemia: Currently on  simvastatin.  4. Tobacco use: We should encourage her to quit smoking.    Disposition:   FU with me in 1 month  Signed, Kathlyn Sacramento, MD  12/03/2015 5:02 PM    [facility]

## 2015-12-03 NOTE — Patient Instructions (Signed)
Medication Instructions:  Your physician has recommended you make the following change in your medication:  STOP taking atenolol START taking coreg 3.'125mg'$  twice daily   Labwork: none  Testing/Procedures: Your physician has requested that you have an echocardiogram. Echocardiography is a painless test that uses sound waves to create images of your heart. It provides your doctor with information about the size and shape of your heart and how well your heart's chambers and valves are working. This procedure takes approximately one hour. There are no restrictions for this procedure.    Follow-Up: Your physician recommends that you schedule a follow-up appointment with Dr. Fletcher Anon after echo.    Any Other Special Instructions Will Be Listed Below (If Applicable).     If you need a refill on your cardiac medications before your next appointment, please call your pharmacy.

## 2015-12-04 ENCOUNTER — Telehealth: Payer: Self-pay | Admitting: Cardiovascular Disease

## 2015-12-04 MED ORDER — CARVEDILOL 3.125 MG PO TABS
3.1250 mg | ORAL_TABLET | Freq: Two times a day (BID) | ORAL | Status: DC
Start: 2015-12-04 — End: 2016-04-14

## 2015-12-04 NOTE — Telephone Encounter (Signed)
Refill sent for Coreg 90 day supply.

## 2015-12-04 NOTE — Telephone Encounter (Signed)
°*  STAT* If patient is at the pharmacy, call can be transferred to refill team.   1. Which medications need to be refilled? (please list name of each medication and dose if known) Coreg   2. Which pharmacy/location (including street and city if local pharmacy) is medication to be sent to? [facility] in [place]   3. Do they need a 30 day or 90 day supply? 90 day

## 2015-12-09 ENCOUNTER — Encounter: Payer: Self-pay | Admitting: *Deleted

## 2015-12-09 ENCOUNTER — Telehealth: Payer: Self-pay | Admitting: *Deleted

## 2015-12-09 NOTE — Telephone Encounter (Signed)
Called pt to inform her that her insurance has denied the lyrica until she has 2 trials of different meds and then they would approve lyrica.  The suggestions were anti sezure med or anti depressant.  Dr. B wanted to try amittiptyline however there is cardiac poss. Side effects and with her dec. In EF and her herceptin being held he did not want to use this med and wanted to try appealing this denial.  I have written a letter and attached muga scans, side effects of the antidepressant and see if it can be overturned.  Will let pt know the outcome of this when I get response. Pt is agreeable to this plan

## 2015-12-13 ENCOUNTER — Other Ambulatory Visit: Payer: Self-pay

## 2015-12-13 ENCOUNTER — Ambulatory Visit (INDEPENDENT_AMBULATORY_CARE_PROVIDER_SITE_OTHER): Payer: BLUE CROSS/BLUE SHIELD

## 2015-12-13 DIAGNOSIS — R0602 Shortness of breath: Secondary | ICD-10-CM | POA: Diagnosis not present

## 2015-12-16 ENCOUNTER — Other Ambulatory Visit: Payer: Self-pay | Admitting: Internal Medicine

## 2015-12-16 ENCOUNTER — Telehealth: Payer: Self-pay | Admitting: *Deleted

## 2015-12-16 NOTE — Telephone Encounter (Signed)
Called to see if they have tried lyrica rx again.  The doctor states that he denial was overturned and insurance approved. She she ran rx and it was approved and pharmacy will notify pt that is is approved.

## 2015-12-18 ENCOUNTER — Other Ambulatory Visit: Payer: Self-pay | Admitting: Internal Medicine

## 2015-12-18 ENCOUNTER — Telehealth: Payer: Self-pay | Admitting: Internal Medicine

## 2015-12-18 DIAGNOSIS — C50412 Malignant neoplasm of upper-outer quadrant of left female breast: Secondary | ICD-10-CM

## 2015-12-18 DIAGNOSIS — R9430 Abnormal result of cardiovascular function study, unspecified: Secondary | ICD-10-CM

## 2015-12-18 DIAGNOSIS — Z79899 Other long term (current) drug therapy: Secondary | ICD-10-CM

## 2015-12-18 NOTE — Telephone Encounter (Signed)
Called pt after speaking to Dr. Jacinto Reap.  He was able to speak to Dr. Fletcher Anon and the rec: was repeat MUGA in 2 months from the last echo and then restart herceptin when it gets better.  Should hold off on herceptin until the EF gets better.  If ok with pt to make another MUGA in 2 months and see md after 1-2 days or pt can keep her April appt to see md and she prefers to see him after the muga scn in 2 months so i will cancel this appt. And mail her new appt for MUGA and see md 1-2 days after MUGA.  Pt agreeable to plan

## 2015-12-18 NOTE — Telephone Encounter (Signed)
Spoke to cardiology, Dr,Arida-; recommends follow up MUGA scan in 2 months. Continue to hold herceptin for now. Follow up as planned. Please inform pt.

## 2015-12-18 NOTE — Addendum Note (Signed)
Addended by: Luella Cook on: 12/18/2015 05:53 PM   Modules accepted: Orders

## 2015-12-19 ENCOUNTER — Ambulatory Visit: Payer: Self-pay | Admitting: General Surgery

## 2015-12-23 ENCOUNTER — Ambulatory Visit (INDEPENDENT_AMBULATORY_CARE_PROVIDER_SITE_OTHER): Payer: BLUE CROSS/BLUE SHIELD | Admitting: General Surgery

## 2015-12-23 ENCOUNTER — Encounter: Payer: Self-pay | Admitting: General Surgery

## 2015-12-23 VITALS — BP 130/78 | HR 72 | Resp 12 | Ht 66.0 in | Wt 162.0 lb

## 2015-12-23 DIAGNOSIS — C50912 Malignant neoplasm of unspecified site of left female breast: Secondary | ICD-10-CM | POA: Diagnosis not present

## 2015-12-23 DIAGNOSIS — Z1239 Encounter for other screening for malignant neoplasm of breast: Secondary | ICD-10-CM | POA: Diagnosis not present

## 2015-12-23 NOTE — Patient Instructions (Addendum)
Patient to return in six month left iagnotic mammogram.  Right screening mammogram now

## 2015-12-23 NOTE — Progress Notes (Signed)
Patient ID: Sarah Horne, female   DOB: 15-Jan-1956, 61 y.o.   MRN: [id number]  Chief Complaint  Patient presents with  . Follow-up    breast cancer    HPI Sarah Horne is a 60 y.o. female  Here today for her follow up breast cancer check up. Patient states she is doing well.  HPI  Past Medical History  Diagnosis Date  . Hypertension   . Hyperlipidemia   . Heart attack ([place]) 1998  . Cancer ([place]) 2016    Stage II, ER positive, PR positive, HER-2/neu overexpressing of the left breast.  . Neuropathy ([place])   . Neuromuscular disorder (HCC)     neuropathy  . Chemotherapy-induced peripheral neuropathy [facility])     Past Surgical History  Procedure Laterality Date  . Portacath placement Right 12-31-14    Dr Bary Castilla  . Breast lumpectomy with sentinel lymph node biopsy Left 05/23/2015    Procedure: LEFT BREAST WIDE EXCISION WITH AXILLARY DISSECTION, MASTOPLASTY ;  Surgeon: [name], MD;  Location: [facility] ORS;  Service: General;  Laterality: Left;  . Breast surgery Left 12/18/14    breast biopsy/INVASIVE DUCTAL CARCINOMA OF BREAST, NOTTINGHAM GRADE 2.  . Breast surgery  05/23/2015.    Wide excision/mastoplasty, axillary dissection. No residual invasive cancer, positive for residual DCIS. 0/2 nodes identified on axillary dissection. (no SLN by technetium or methylene blue)  . Cardiac catheterization      Family History  Problem Relation Age of Onset  . Breast cancer Maternal Aunt   . Breast cancer Cousin   . Brain cancer Maternal Uncle     Social History Social History  Substance Use Topics  . Smoking status: Current Every Day Smoker -- 0.50 packs/day for 18 years    Types: Cigarettes  . Smokeless tobacco: None  . Alcohol Use: No    Allergies  Allergen Reactions  . No Known Allergies     Current Outpatient Prescriptions  Medication Sig Dispense Refill  . anastrozole (ARIMIDEX) 1 MG tablet Take 1 tablet (1 mg total) by mouth daily. 90 tablet 3  . aspirin 81 MG  tablet Take 81 mg by mouth daily.    . carvedilol (COREG) 3.125 MG tablet Take 1 tablet (3.125 mg total) by mouth 2 (two) times daily. 180 tablet 3  . lisinopril (PRINIVIL,ZESTRIL) 10 MG tablet Take 1 tablet (10 mg total) by mouth daily. 90 tablet 0  . pregabalin (LYRICA) 75 MG capsule Take 1 capsule (75 mg total) by mouth 2 (two) times daily. 60 capsule 3  . simvastatin (ZOCOR) 40 MG tablet Take 1 tablet (40 mg total) by mouth daily at 6 PM. 90 tablet 0   No current facility-administered medications for this visit.   Facility-Administered Medications Ordered in Other Visits  Medication Dose Route Frequency Provider Last Rate Last Dose  . 0.9 %  sodium chloride infusion   Intravenous Continuous [name], MD      . 0.9 %  sodium chloride infusion   Intravenous Continuous Lloyd Huger, MD 999 mL/hr at 04/24/15 1520    . heparin lock flush 100 unit/mL  500 Units Intravenous Once Dmitriy Berenzon, MD      . sodium chloride 0.45 % 1,000 mL infusion   Intravenous Continuous [name], MD 500 mL/hr at 04/04/15 1444    . sodium chloride 0.9 % injection 10 mL  10 mL Intravenous PRN [name], MD   10 mL at 04/04/15 1440  . sodium chloride flush (NS) 0.9 %  injection 10 mL  10 mL Intravenous PRN Roxana Hires, MD        Review of Systems Review of Systems  Constitutional: Negative.   Respiratory: Negative.   Cardiovascular: Negative.     Blood pressure 130/78, pulse 72, resp. rate 12, height 5' 6"  (1.676 m), weight 162 lb (73.483 kg).  Physical Exam Physical Exam  Constitutional: She is oriented to person, place, and time. She appears well-developed and well-nourished.  Eyes: Conjunctivae are normal. No scleral icterus.  Neck: Neck supple.  Cardiovascular: Normal rate, regular rhythm and normal heart sounds.   Pulmonary/Chest: Effort normal and breath sounds normal. Right breast exhibits no inverted nipple, no mass, no nipple discharge, no skin change and no tenderness.  Left breast exhibits no inverted nipple, no mass, no nipple discharge, no skin change and no tenderness.    Port site is fine. Left breast laterally side about half 4 by 5 cm thickening   Lymphadenopathy:    She has no cervical adenopathy.    She has no axillary adenopathy.  Neurological: She is alert and oriented to person, place, and time.  Skin: Skin is warm and dry.   The upper extremities were measured at a location 15 cm superior as well as 10 and 20 cm inferior to the olecranon process. Measurements obtained 12/23/2015:  On the right side: 34.5, 28, 20.  On the left side: 35.5, 26, 20   Measurements obtained 08/19/2015:  On the right side:  33.5, 28 and 21 cm respectively.  On the left side: 34, 27.5 and 20 cm respectively  Data Reviewed No current mammograms for review.  Assessment    Doing well status post breast conservation. Minimal change in upper extremity measurements with no trend towards lymphedema.    Plan    We'll plan for a follow-up evaluation of the right breast with mammography now in the left breast in 6 months Dicussed colonoscoy with patient. Patient wishes to postpone at this time.     Patient to return in six months with a left diagnostic mammogram. Right breast screening mammogram to be scheduled now.  PCP:  Keith Rake  This information has been scribed by Gaspar Cola CMA.    [name] 12/24/2015, 10:51 AM

## 2015-12-24 ENCOUNTER — Inpatient Hospital Stay: Payer: BLUE CROSS/BLUE SHIELD

## 2015-12-24 ENCOUNTER — Ambulatory Visit: Payer: BLUE CROSS/BLUE SHIELD

## 2015-12-24 DIAGNOSIS — Z95828 Presence of other vascular implants and grafts: Secondary | ICD-10-CM

## 2015-12-24 DIAGNOSIS — C50412 Malignant neoplasm of upper-outer quadrant of left female breast: Secondary | ICD-10-CM | POA: Diagnosis not present

## 2015-12-24 MED ORDER — SODIUM CHLORIDE 0.9% FLUSH
10.0000 mL | INTRAVENOUS | Status: DC | PRN
  Administered 2015-12-24: 10 mL via INTRAVENOUS
  Filled 2015-12-24: qty 10

## 2015-12-24 MED ORDER — HEPARIN SOD (PORK) LOCK FLUSH 100 UNIT/ML IV SOLN
500.0000 [IU] | Freq: Once | INTRAVENOUS | Status: AC
  Administered 2015-12-24: 500 [IU] via INTRAVENOUS

## 2015-12-24 NOTE — Progress Notes (Signed)
Survivorship Care Plan visit completed.  Treatment summary reviewed and given to patient.  ASCO answers booklet reviewed and given to patient.  CARE program and Cancer Transitions discussed with patient along with other resources provided by cancer center.  Patient verbalized understanding.

## 2015-12-30 ENCOUNTER — Other Ambulatory Visit: Payer: Self-pay | Admitting: Family Medicine

## 2015-12-30 NOTE — Telephone Encounter (Signed)
Medication has been refilled and sent to [facility]

## 2016-01-02 ENCOUNTER — Ambulatory Visit: Payer: BLUE CROSS/BLUE SHIELD | Admitting: Internal Medicine

## 2016-01-13 ENCOUNTER — Ambulatory Visit (INDEPENDENT_AMBULATORY_CARE_PROVIDER_SITE_OTHER): Payer: BLUE CROSS/BLUE SHIELD | Admitting: Cardiovascular Disease

## 2016-01-13 ENCOUNTER — Encounter: Payer: Self-pay | Admitting: Cardiovascular Disease

## 2016-01-13 VITALS — BP 110/80 | HR 92 | Ht 66.0 in | Wt 163.0 lb

## 2016-01-13 DIAGNOSIS — I429 Cardiomyopathy, unspecified: Secondary | ICD-10-CM | POA: Diagnosis not present

## 2016-01-13 NOTE — Progress Notes (Signed)
Cardiology Office Note   Date:  01/13/2016   ID:  Sarah Horne, DOB 11-Aug-1956, MRN [id number]  PCP:  Keith Rake, MD  Cardiologist:   Kathlyn Sacramento, MD  Oncologist: Dr.Brahmanday  Chief Complaint  Patient presents with  . other    Follow up from  Echo. Meds reviewed by the patient verbally. "doing well."      History of Present Illness: Sarah Horne is a 60 y.o. female who Is here today for a follow-up visit regarding Herceptin-induced cardiomyopathy. The patient reports possible prior myocardial infarction in 1998 where she underwent cardiac catheterization at [facility]. She did not require revascularization and she was treated medically. The details of that admission are not available. She has chronic medical conditions that include hypertension, hyperlipidemia and tobacco use . She was diagnosed with stage II breast cancer last year. She started treatment with Taxotere/Carboplatin/Herceptin/Perjeta in April 2016. She underwent a MUGA scan in July which showed an ejection fraction of 63%. This dropped to 51% in December. Given that she was asymptomatic, Herceptin was given in December. She underwent a repeat MUGA scan on February 27 which showed a drop in ejection fraction to 44%. T Echocardiogram in March 2017 showed an ejection fraction of 45-50% with no significant valvular abnormalities or pulmonary hypertension. She was switched from atenolol to carvedilol. She continues to be asymptomatic.   Past Medical History  Diagnosis Date  . Hypertension   . Hyperlipidemia   . Heart attack ([place]) 1998  . Cancer ([place]) 2016    Stage II, ER positive, PR positive, HER-2/neu overexpressing of the left breast.  . Neuropathy ([place])   . Neuromuscular disorder (HCC)     neuropathy  . Chemotherapy-induced peripheral neuropathy [facility]     Past Surgical History  Procedure Laterality Date  . Portacath placement Right 12-31-14    Dr Bary Castilla  . Breast lumpectomy with sentinel lymph node  biopsy Left 05/23/2015    Procedure: LEFT BREAST WIDE EXCISION WITH AXILLARY DISSECTION, MASTOPLASTY ;  Surgeon: [name], MD;  Location: [facility] ORS;  Service: General;  Laterality: Left;  . Breast surgery Left 12/18/14    breast biopsy/INVASIVE DUCTAL CARCINOMA OF BREAST, NOTTINGHAM GRADE 2.  . Breast surgery  05/23/2015.    Wide excision/mastoplasty, axillary dissection. No residual invasive cancer, positive for residual DCIS. 0/2 nodes identified on axillary dissection. (no SLN by technetium or methylene blue)  . Cardiac catheterization       Current Outpatient Prescriptions  Medication Sig Dispense Refill  . anastrozole (ARIMIDEX) 1 MG tablet Take 1 tablet (1 mg total) by mouth daily. 90 tablet 3  . aspirin 81 MG tablet Take 81 mg by mouth daily.    . carvedilol (COREG) 3.125 MG tablet Take 1 tablet (3.125 mg total) by mouth 2 (two) times daily. 180 tablet 3  . lisinopril (PRINIVIL,ZESTRIL) 10 MG tablet Take 1 tablet (10 mg total) by mouth daily. 90 tablet 0  . pregabalin (LYRICA) 75 MG capsule Take 1 capsule (75 mg total) by mouth 2 (two) times daily. 60 capsule 3  . simvastatin (ZOCOR) 40 MG tablet Take 1 tablet (40 mg total) by mouth daily at 6 PM. 90 tablet 0   No current facility-administered medications for this visit.   Facility-Administered Medications Ordered in Other Visits  Medication Dose Route Frequency Provider Last Rate Last Dose  . 0.9 %  sodium chloride infusion   Intravenous Continuous [name], MD      . 0.9 %  sodium chloride infusion   Intravenous Continuous Lloyd Huger, MD 999 mL/hr at 04/24/15 1520    . heparin lock flush 100 unit/mL  500 Units Intravenous Once Dmitriy Berenzon, MD      . sodium chloride 0.45 % 1,000 mL infusion   Intravenous Continuous [name], MD 500 mL/hr at 04/04/15 1444    . sodium chloride 0.9 % injection 10 mL  10 mL Intravenous PRN [name], MD   10 mL at 04/04/15 1440  . sodium chloride flush (NS) 0.9 %  injection 10 mL  10 mL Intravenous PRN Roxana Hires, MD        Allergies:   No known allergies    Social History:  The patient  reports that she has been smoking Cigarettes.  She has a 9 pack-year smoking history. She does not have any smokeless tobacco history on file. She reports that she does not drink alcohol or use illicit drugs.   Family History:  The patient's family history includes Brain cancer in her maternal uncle; Breast cancer in her cousin and maternal aunt.    ROS:  Please see the history of present illness.   Otherwise, review of systems are positive for none.   All other systems are reviewed and negative.    PHYSICAL EXAM: VS:  BP 110/80 mmHg  Pulse 92  Ht 5' 6"  (1.676 m)  Wt 163 lb (73.936 kg)  BMI 26.32 kg/m2 , BMI Body mass index is 26.32 kg/(m^2). GEN: Well nourished, well developed, in no acute distress HEENT: normal Neck: no JVD, carotid bruits, or masses Cardiac: RRR; no murmurs, rubs, or gallops,no edema  Respiratory:  clear to auscultation bilaterally, normal work of breathing GI: soft, nontender, nondistended, + BS MS: no deformity or atrophy Skin: warm and dry, no rash Neuro:  Strength and sensation are intact Psych: euthymic mood, full affect   EKG:  EKG is ordered today. The ekg ordered today demonstrates sinus rhythm with sinus arrhythmia and first-degree AV block. No significant ST changes.  Recent Labs: 09/26/2015: ALT 22; BUN 26*; Creatinine, Ser 0.77; Hemoglobin 13.6; Platelets 216; Potassium 4.0; Sodium 134*    Lipid Panel No results found for: CHOL, TRIG, HDL, CHOLHDL, VLDL, LDLCALC, LDLDIRECT    Wt Readings from Last 3 Encounters:  01/13/16 163 lb (73.936 kg)  12/23/15 162 lb (73.483 kg)  12/03/15 157 lb 8 oz (71.442 kg)         ASSESSMENT AND PLAN:  1.  Chemotherapy induced cardiomyopathy:  90% of Herceptin associated cardiomyopathy are reversible. She has not received Herceptin since December With most recent ejection  fraction of 45-50% on echo. The plan is to repeat MUGA scan in May. If ejection fraction is improved, Herceptin can be resumed. If there is worsening in ejection fraction, we will need to up titrate cardiomyopathy medications and likely add spironolactone.   2. Essential hypertension: Blood pressure is controlled.  3. Hyperlipidemia: Currently on simvastatin.  4. Tobacco use  Disposition:   FU with me in 3 month  Signed,  Kathlyn Sacramento, MD  01/13/2016 4:00 PM    [place]

## 2016-01-13 NOTE — Patient Instructions (Signed)
Medication Instructions: No changes.   Labwork: None.   Procedures/Testing: None.   Follow-Up: 3 months with Dr. Fletcher Anon.   Any Additional Special Instructions Will Be Listed Below (If Applicable).     If you need a refill on your cardiac medications before your next appointment, please call your pharmacy.

## 2016-01-29 ENCOUNTER — Ambulatory Visit (INDEPENDENT_AMBULATORY_CARE_PROVIDER_SITE_OTHER): Payer: BLUE CROSS/BLUE SHIELD | Admitting: Family Medicine

## 2016-01-29 ENCOUNTER — Other Ambulatory Visit
Admission: RE | Admit: 2016-01-29 | Discharge: 2016-01-29 | Disposition: A | Discharge status: Home / Self Care | Payer: BLUE CROSS/BLUE SHIELD | Source: Ambulatory Visit | Attending: Family Medicine | Admitting: Family Medicine

## 2016-01-29 ENCOUNTER — Encounter: Payer: Self-pay | Admitting: Family Medicine

## 2016-01-29 ENCOUNTER — Ambulatory Visit
Admission: RE | Admit: 2016-01-29 | Discharge: 2016-01-29 | Disposition: A | Discharge status: Home / Self Care | Payer: BLUE CROSS/BLUE SHIELD | Source: Ambulatory Visit | Attending: General Surgery | Admitting: General Surgery

## 2016-01-29 ENCOUNTER — Ambulatory Visit: Payer: BLUE CROSS/BLUE SHIELD

## 2016-01-29 VITALS — BP 110/78 | HR 75 | Temp 97.9°F | Resp 18 | Ht 66.0 in | Wt 162.1 lb

## 2016-01-29 DIAGNOSIS — Z1239 Encounter for other screening for malignant neoplasm of breast: Secondary | ICD-10-CM

## 2016-01-29 DIAGNOSIS — I10 Essential (primary) hypertension: Secondary | ICD-10-CM

## 2016-01-29 DIAGNOSIS — E785 Hyperlipidemia, unspecified: Secondary | ICD-10-CM

## 2016-01-29 DIAGNOSIS — R739 Hyperglycemia, unspecified: Secondary | ICD-10-CM | POA: Diagnosis not present

## 2016-01-29 DIAGNOSIS — C50912 Malignant neoplasm of unspecified site of left female breast: Secondary | ICD-10-CM

## 2016-01-29 DIAGNOSIS — Z1231 Encounter for screening mammogram for malignant neoplasm of breast: Secondary | ICD-10-CM | POA: Diagnosis not present

## 2016-01-29 LAB — COMPREHENSIVE METABOLIC PANEL
ALT: 25 U/L (ref 14–54)
ANION GAP: 9 (ref 5–15)
AST: 31 U/L (ref 15–41)
Albumin: 4.5 g/dL (ref 3.5–5.0)
Alkaline Phosphatase: 49 U/L (ref 38–126)
BILIRUBIN TOTAL: 0.6 mg/dL (ref 0.3–1.2)
BUN: 17 mg/dL (ref 6–20)
CO2: 28 mmol/L (ref 22–32)
Calcium: 9.5 mg/dL (ref 8.9–10.3)
Chloride: 101 mmol/L (ref 101–111)
Creatinine, Ser: 0.89 mg/dL (ref 0.44–1.00)
Glucose, Bld: 89 mg/dL (ref 65–99)
POTASSIUM: 4.8 mmol/L (ref 3.5–5.1)
Sodium: 138 mmol/L (ref 135–145)
TOTAL PROTEIN: 7.5 g/dL (ref 6.5–8.1)

## 2016-01-29 LAB — LIPID PANEL
CHOL/HDL RATIO: 3.1 ratio
CHOLESTEROL: 130 mg/dL (ref 0–200)
HDL: 42 mg/dL (ref >40)
LDL Cholesterol: 74 mg/dL (ref 0–99)
TRIGLYCERIDES: 72 mg/dL (ref <150)
VLDL: 14 mg/dL (ref 0–40)

## 2016-01-29 LAB — GLUCOSE, POCT (MANUAL RESULT ENTRY): POC Glucose: 101 mg/dl — AB (ref 70–99)

## 2016-01-29 LAB — POCT GLYCOSYLATED HEMOGLOBIN (HGB A1C): Hemoglobin A1C: 5.6

## 2016-01-29 NOTE — Progress Notes (Signed)
Name: Sarah Horne   MRN: [id number]    DOB: [birth year]   Date:01/29/2016       Progress Note  Subjective  Chief Complaint  Chief Complaint  Patient presents with  . Hypertension    3 month follow up     Hypertension This is a chronic problem. The problem is controlled. Pertinent negatives include no blurred vision, chest pain, headaches, palpitations or shortness of breath. Past treatments include ACE inhibitors and beta blockers. There are no compliance problems.  Hypertensive end-organ damage includes CAD/MI. There is no history of kidney disease or CVA.  Hyperlipidemia This is a chronic problem. The problem is controlled. Recent lipid tests were reviewed and are normal. Pertinent negatives include no chest pain, leg pain, myalgias or shortness of breath. Current antihyperlipidemic treatment includes statins.     Past Medical History  Diagnosis Date  . Hypertension   . Hyperlipidemia   . Heart attack ([place]) 1998  . Cancer ([place]) 2016    Stage II, ER positive, PR positive, HER-2/neu overexpressing of the left breast.  . Neuropathy ([place])   . Neuromuscular disorder (HCC)     neuropathy  . Chemotherapy-induced peripheral neuropathy [facility])     Past Surgical History  Procedure Laterality Date  . Portacath placement Right 12-31-14    Dr Bary Castilla  . Breast lumpectomy with sentinel lymph node biopsy Left 05/23/2015    Procedure: LEFT BREAST WIDE EXCISION WITH AXILLARY DISSECTION, MASTOPLASTY ;  Surgeon: [name], MD;  Location: [facility] ORS;  Service: General;  Laterality: Left;  . Breast surgery Left 12/18/14    breast biopsy/INVASIVE DUCTAL CARCINOMA OF BREAST, NOTTINGHAM GRADE 2.  . Breast surgery  05/23/2015.    Wide excision/mastoplasty, axillary dissection. No residual invasive cancer, positive for residual DCIS. 0/2 nodes identified on axillary dissection. (no SLN by technetium or methylene blue)  . Cardiac catheterization      Family History  Problem Relation Age of  Onset  . Breast cancer Maternal Aunt   . Breast cancer Cousin   . Brain cancer Maternal Uncle     Social History   Social History  . Marital Status: Divorced    Spouse Name: N/A  . Number of Children: N/A  . Years of Education: N/A   Occupational History  . Not on file.   Social History Main Topics  . Smoking status: Current Every Day Smoker -- 0.50 packs/day for 18 years    Types: Cigarettes  . Smokeless tobacco: Not on file  . Alcohol Use: No  . Drug Use: No  . Sexual Activity: Not on file   Other Topics Concern  . Not on file   Social History Narrative     Current outpatient prescriptions:  .  anastrozole (ARIMIDEX) 1 MG tablet, Take 1 tablet (1 mg total) by mouth daily., Disp: 90 tablet, Rfl: 3 .  aspirin 81 MG tablet, Take 81 mg by mouth daily., Disp: , Rfl:  .  carvedilol (COREG) 3.125 MG tablet, Take 1 tablet (3.125 mg total) by mouth 2 (two) times daily., Disp: 180 tablet, Rfl: 3 .  lisinopril (PRINIVIL,ZESTRIL) 10 MG tablet, Take 1 tablet (10 mg total) by mouth daily., Disp: 90 tablet, Rfl: 0 .  pregabalin (LYRICA) 75 MG capsule, Take 1 capsule (75 mg total) by mouth 2 (two) times daily., Disp: 60 capsule, Rfl: 3 .  simvastatin (ZOCOR) 40 MG tablet, Take 1 tablet (40 mg total) by mouth daily at 6 PM., Disp: 90 tablet, Rfl: 0 No  current facility-administered medications for this visit.  Facility-Administered Medications Ordered in Other Visits:  .  0.9 %  sodium chloride infusion, , Intravenous, Continuous, [name], MD .  0.9 %  sodium chloride infusion, , Intravenous, Continuous, Lloyd Huger, MD, Last Rate: 999 mL/hr at 04/24/15 1520 .  heparin lock flush 100 unit/mL, 500 Units, Intravenous, Once, Dmitriy Berenzon, MD .  sodium chloride 0.45 % 1,000 mL infusion, , Intravenous, Continuous, [name], MD, Last Rate: 500 mL/hr at 04/04/15 1444 .  sodium chloride 0.9 % injection 10 mL, 10 mL, Intravenous, PRN, [name], MD, 10 mL at 04/04/15  1440 .  sodium chloride flush (NS) 0.9 % injection 10 mL, 10 mL, Intravenous, PRN, Roxana Hires, MD  Allergies  Allergen Reactions  . No Known Allergies      Review of Systems  Eyes: Negative for blurred vision.  Respiratory: Negative for shortness of breath.   Cardiovascular: Negative for chest pain and palpitations.  Musculoskeletal: Negative for myalgias.  Neurological: Negative for headaches.    Objective  Filed Vitals:   01/29/16 0812  BP: 110/78  Pulse: 75  Temp: 97.9 F (36.6 C)  Resp: 18  Height: _0  (1.676 m)  Weight: 162 lb 2 oz (73.539 kg)  SpO2: 99%    Physical Exam  Constitutional: She is oriented to person, place, and time and well-developed, well-nourished, and in no distress.  HENT:  Head: Normocephalic and atraumatic.  Cardiovascular: Normal rate and regular rhythm.   Pulmonary/Chest: Effort normal and breath sounds normal.  Neurological: She is alert and oriented to person, place, and time.  Nursing note and vitals reviewed.     Assessment & Plan  1. Essential hypertension Blood pressure is at goal, now on  Carvedilol, started by cardiology.  2. Hyperlipidemia Repeat FLP and consider medication adjustment - Lipid Profile - Comprehensive Metabolic Panel (CMET)  3. Hyperglycemia  - POCT HgB A1C - POCT Glucose (CBG)   Masud Holub [facility] 01/29/2016 8:32 AM

## 2016-02-04 ENCOUNTER — Inpatient Hospital Stay: Payer: BLUE CROSS/BLUE SHIELD | Attending: Internal Medicine

## 2016-02-04 DIAGNOSIS — Z7982 Long term (current) use of aspirin: Secondary | ICD-10-CM | POA: Diagnosis not present

## 2016-02-04 DIAGNOSIS — Z5112 Encounter for antineoplastic immunotherapy: Secondary | ICD-10-CM | POA: Diagnosis not present

## 2016-02-04 DIAGNOSIS — C50412 Malignant neoplasm of upper-outer quadrant of left female breast: Secondary | ICD-10-CM | POA: Diagnosis not present

## 2016-02-04 DIAGNOSIS — Z452 Encounter for adjustment and management of vascular access device: Secondary | ICD-10-CM | POA: Insufficient documentation

## 2016-02-04 DIAGNOSIS — G620 Drug-induced polyneuropathy: Secondary | ICD-10-CM | POA: Diagnosis not present

## 2016-02-04 DIAGNOSIS — Z170 Estrogen receptor positive status [ER+]: Secondary | ICD-10-CM | POA: Diagnosis not present

## 2016-02-04 DIAGNOSIS — C773 Secondary and unspecified malignant neoplasm of axilla and upper limb lymph nodes: Secondary | ICD-10-CM | POA: Insufficient documentation

## 2016-02-04 DIAGNOSIS — Z95828 Presence of other vascular implants and grafts: Secondary | ICD-10-CM

## 2016-02-04 DIAGNOSIS — I10 Essential (primary) hypertension: Secondary | ICD-10-CM | POA: Diagnosis not present

## 2016-02-04 DIAGNOSIS — E785 Hyperlipidemia, unspecified: Secondary | ICD-10-CM | POA: Diagnosis not present

## 2016-02-04 DIAGNOSIS — I252 Old myocardial infarction: Secondary | ICD-10-CM | POA: Insufficient documentation

## 2016-02-04 DIAGNOSIS — Z803 Family history of malignant neoplasm of breast: Secondary | ICD-10-CM | POA: Insufficient documentation

## 2016-02-04 DIAGNOSIS — Z79811 Long term (current) use of aromatase inhibitors: Secondary | ICD-10-CM | POA: Insufficient documentation

## 2016-02-04 DIAGNOSIS — F17210 Nicotine dependence, cigarettes, uncomplicated: Secondary | ICD-10-CM | POA: Diagnosis not present

## 2016-02-04 DIAGNOSIS — Z79899 Other long term (current) drug therapy: Secondary | ICD-10-CM | POA: Diagnosis not present

## 2016-02-04 DIAGNOSIS — T451X5S Adverse effect of antineoplastic and immunosuppressive drugs, sequela: Secondary | ICD-10-CM | POA: Insufficient documentation

## 2016-02-04 MED ORDER — SODIUM CHLORIDE 0.9% FLUSH
10.0000 mL | INTRAVENOUS | Status: DC | PRN
  Administered 2016-02-04: 10 mL via INTRAVENOUS
  Filled 2016-02-04: qty 10

## 2016-02-04 MED ORDER — HEPARIN SOD (PORK) LOCK FLUSH 100 UNIT/ML IV SOLN
500.0000 [IU] | Freq: Once | INTRAVENOUS | Status: AC
  Administered 2016-02-04: 500 [IU] via INTRAVENOUS

## 2016-02-04 MED ORDER — HEPARIN SOD (PORK) LOCK FLUSH 100 UNIT/ML IV SOLN
INTRAVENOUS | Status: AC
  Filled 2016-02-04: qty 5

## 2016-02-13 ENCOUNTER — Ambulatory Visit
Admission: RE | Admit: 2016-02-13 | Discharge: 2016-02-13 | Disposition: A | Discharge status: Home / Self Care | Payer: BLUE CROSS/BLUE SHIELD | Source: Ambulatory Visit | Attending: Internal Medicine | Admitting: Internal Medicine

## 2016-02-13 DIAGNOSIS — C50412 Malignant neoplasm of upper-outer quadrant of left female breast: Secondary | ICD-10-CM

## 2016-02-13 DIAGNOSIS — Z79899 Other long term (current) drug therapy: Secondary | ICD-10-CM | POA: Diagnosis not present

## 2016-02-13 DIAGNOSIS — R9430 Abnormal result of cardiovascular function study, unspecified: Secondary | ICD-10-CM

## 2016-02-13 DIAGNOSIS — R0989 Other specified symptoms and signs involving the circulatory and respiratory systems: Secondary | ICD-10-CM | POA: Diagnosis not present

## 2016-02-13 DIAGNOSIS — C50919 Malignant neoplasm of unspecified site of unspecified female breast: Secondary | ICD-10-CM | POA: Diagnosis not present

## 2016-02-13 MED ORDER — TECHNETIUM TC 99M-LABELED RED BLOOD CELLS IV KIT
20.0000 | PACK | Freq: Once | INTRAVENOUS | Status: AC | PRN
  Administered 2016-02-13: 22.83 via INTRAVENOUS

## 2016-02-17 ENCOUNTER — Inpatient Hospital Stay (HOSPITAL_BASED_OUTPATIENT_CLINIC_OR_DEPARTMENT_OTHER): Payer: BLUE CROSS/BLUE SHIELD | Admitting: Internal Medicine

## 2016-02-17 ENCOUNTER — Telehealth: Payer: Self-pay | Admitting: *Deleted

## 2016-02-17 VITALS — BP 129/83 | HR 111 | Temp 97.3°F | Resp 18 | Wt 163.4 lb

## 2016-02-17 DIAGNOSIS — Z79811 Long term (current) use of aromatase inhibitors: Secondary | ICD-10-CM | POA: Diagnosis not present

## 2016-02-17 DIAGNOSIS — I10 Essential (primary) hypertension: Secondary | ICD-10-CM | POA: Diagnosis not present

## 2016-02-17 DIAGNOSIS — Z170 Estrogen receptor positive status [ER+]: Secondary | ICD-10-CM

## 2016-02-17 DIAGNOSIS — Z79899 Other long term (current) drug therapy: Secondary | ICD-10-CM

## 2016-02-17 DIAGNOSIS — E785 Hyperlipidemia, unspecified: Secondary | ICD-10-CM | POA: Diagnosis not present

## 2016-02-17 DIAGNOSIS — Z7982 Long term (current) use of aspirin: Secondary | ICD-10-CM

## 2016-02-17 DIAGNOSIS — T451X5S Adverse effect of antineoplastic and immunosuppressive drugs, sequela: Secondary | ICD-10-CM

## 2016-02-17 DIAGNOSIS — G620 Drug-induced polyneuropathy: Secondary | ICD-10-CM | POA: Diagnosis not present

## 2016-02-17 DIAGNOSIS — Z452 Encounter for adjustment and management of vascular access device: Secondary | ICD-10-CM | POA: Diagnosis not present

## 2016-02-17 DIAGNOSIS — F17210 Nicotine dependence, cigarettes, uncomplicated: Secondary | ICD-10-CM | POA: Diagnosis not present

## 2016-02-17 DIAGNOSIS — I252 Old myocardial infarction: Secondary | ICD-10-CM

## 2016-02-17 DIAGNOSIS — C50412 Malignant neoplasm of upper-outer quadrant of left female breast: Secondary | ICD-10-CM

## 2016-02-17 DIAGNOSIS — C773 Secondary and unspecified malignant neoplasm of axilla and upper limb lymph nodes: Secondary | ICD-10-CM | POA: Diagnosis not present

## 2016-02-17 DIAGNOSIS — Z5112 Encounter for antineoplastic immunotherapy: Secondary | ICD-10-CM | POA: Diagnosis not present

## 2016-02-17 DIAGNOSIS — Z803 Family history of malignant neoplasm of breast: Secondary | ICD-10-CM | POA: Diagnosis not present

## 2016-02-17 DIAGNOSIS — T451X5A Adverse effect of antineoplastic and immunosuppressive drugs, initial encounter: Principal | ICD-10-CM

## 2016-02-17 MED ORDER — DULOXETINE HCL 60 MG PO CPEP
60.0000 mg | ORAL_CAPSULE | Freq: Every day | ORAL | Status: DC
Start: 2016-02-17 — End: 2016-02-18

## 2016-02-17 NOTE — Telephone Encounter (Signed)
Pt states she has plantar warts and peripheral neuropathy, and wanted to know how to treatment.  I told pt, the evaluation of her neuropathy would help the doctors to decide a treatment and that the wart would need to be checked to see if it was a wart and how to treat it, so to benefit her care she should call for an appt.

## 2016-02-17 NOTE — Progress Notes (Signed)
[place] OFFICE PROGRESS NOTE  Patient Care Team: [name], MD as PCP - General (Family Medicine) [name], MD (General Surgery) [name], MD as Attending Physician (Internal Medicine)   SUMMARY OF ONCOLOGIC HISTORY:  # LEFT BREAST IDC; STAGE II [cT2N1] ER >90%; PR- 50-90%; her 2 NEU- POS; s/p Neoadj chemo; AUG 2016- TCH+P s/p Lumpec & partial ALND- path CR; s/p RT;  adj Herceptin; HELD for Jan 19th 2017 [in DEC 2016-EF dropped from 63 to 51%; FEB 27th EF-42.9%]; JAN 2016 START Arimidex; May 2017- EF- 60%; May 24th 2017-Re-start Herceptin q 3W  # PN- G-2; may 2017- Start cymbalta 68m/d  # April 2016- Liver Bx- NEG   INTERVAL HISTORY:  A very pleasant 60year old female patient with above history of stage II ER/PR positive HER-2/neu positive breast cancer currently on adjuvant/maintenance Herceptin is here for follow-up/ to review the results of the MUGA scan. Patient's Herceptin has been on hold since 09/26/2015 because of drop in ejection fraction.  Patient denies any shortness of breath. Denies any chest pain. Denies any swelling in the legs. No headaches vision changes or double vision.  She continues to complain of tingling and numbness of her hands and feet. She is on Lyrica 75 twice a day No significant improvement noted.  REVIEW OF SYSTEMS:  A complete 10 point review of system is done which is negative except mentioned above/history of present illness.   PAST MEDICAL HISTORY :  Past Medical History  Diagnosis Date  . Hypertension   . Hyperlipidemia   . Heart attack ([place] 1998  . Neuropathy ([facility]   . Neuromuscular disorder (HCC)     neuropathy  . Chemotherapy-induced peripheral neuropathy ([facility]   . Cancer ([facility] 2016    Stage II, ER positive, PR positive, HER-2/neu overexpressing of the left breast.  . Breast cancer ([facility] [place] 2016    Left-radiation    PAST SURGICAL HISTORY :   Past Surgical History  Procedure Laterality Date   . Portacath placement Right 12-31-14    Dr BBary Castilla . Breast lumpectomy with sentinel lymph node biopsy Left 05/23/2015    Procedure: LEFT BREAST WIDE EXCISION WITH AXILLARY DISSECTION, MASTOPLASTY ;  Surgeon: JRobert Bellow MD;  Location: [facility] ORS;  Service: General;  Laterality: Left;  . Breast surgery Left 12/18/14    breast biopsy/INVASIVE DUCTAL CARCINOMA OF BREAST, NOTTINGHAM GRADE 2.  . Breast surgery  05/23/2015.    Wide excision/mastoplasty, axillary dissection. No residual invasive cancer, positive for residual DCIS. 0/2 nodes identified on axillary dissection. (no SLN by technetium or methylene blue)  . Cardiac catheterization    . Breast biopsy Left 2016    Positive    FAMILY HISTORY :   Family History  Problem Relation Age of Onset  . Breast cancer Maternal Aunt   . Breast cancer Cousin   . Brain cancer Maternal Uncle     SOCIAL HISTORY:   Social History  Substance Use Topics  . Smoking status: Current Every Day Smoker -- 0.50 packs/day for 18 years    Types: Cigarettes  . Smokeless tobacco: Not on file  . Alcohol Use: No    ALLERGIES:  is allergic to no known allergies.  MEDICATIONS:  Current Outpatient Prescriptions  Medication Sig Dispense Refill  . anastrozole (ARIMIDEX) 1 MG tablet Take 1 tablet (1 mg total) by mouth daily. 90 tablet 3  . aspirin 81 MG tablet Take 81 mg by mouth daily.    .Marland Kitchen  carvedilol (COREG) 3.125 MG tablet Take 1 tablet (3.125 mg total) by mouth 2 (two) times daily. 180 tablet 3  . lisinopril (PRINIVIL,ZESTRIL) 10 MG tablet Take 1 tablet (10 mg total) by mouth daily. 90 tablet 0  . simvastatin (ZOCOR) 40 MG tablet Take 1 tablet (40 mg total) by mouth daily at 6 PM. 90 tablet 0  . pregabalin (LYRICA) 75 MG capsule Take 1 capsule (75 mg total) by mouth 2 (two) times daily. (Patient not taking: Reported on 02/17/2016) 60 capsule 3   No current facility-administered medications for this visit.   Facility-Administered Medications Ordered in  Other Visits  Medication Dose Route Frequency Provider Last Rate Last Dose  . 0.9 %  sodium chloride infusion   Intravenous Continuous [name], MD      . 0.9 %  sodium chloride infusion   Intravenous Continuous Lloyd Huger, MD 999 mL/hr at 04/24/15 1520    . heparin lock flush 100 unit/mL  500 Units Intravenous Once Dmitriy Berenzon, MD      . sodium chloride 0.45 % 1,000 mL infusion   Intravenous Continuous [name], MD 500 mL/hr at 04/04/15 1444    . sodium chloride 0.9 % injection 10 mL  10 mL Intravenous PRN [name], MD   10 mL at 04/04/15 1440  . sodium chloride flush (NS) 0.9 % injection 10 mL  10 mL Intravenous PRN Dmitriy Berenzon, MD        PHYSICAL EXAMINATION: ECOG PERFORMANCE STATUS: 0 - Asymptomatic  BP 129/83 mmHg  Pulse 111  Temp(Src) 97.3 F (36.3 C) (Tympanic)  Resp 18  Wt 163 lb 5.8 oz (74.1 kg)  Filed Weights   02/17/16 1433  Weight: 163 lb 5.8 oz (74.1 kg)    GENERAL: Well-nourished well-developed; Alert, no distress and comfortable.  Alone. EYES: no pallor or icterus OROPHARYNX: no thrush or ulceration; good dentition  NECK: supple, no masses felt LYMPH:  no palpable lymphadenopathy in the cervical, axillary or inguinal regions LUNGS: clear to auscultation and  No wheeze or crackles HEART/CVS: regular rate & rhythm and no murmurs; No lower extremity edema ABDOMEN:abdomen soft, non-tender and normal bowel sounds Musculoskeletal:no cyanosis of digits and no clubbing  PSYCH: alert & oriented x 3 with fluent speech NEURO: no focal motor/sensory deficits   LABORATORY DATA:  I have reviewed the data as listed    Component Value Date/Time   NA 138 01/29/2016 1002   NA 135 01/08/2015 0850   K 4.8 01/29/2016 1002   K 3.7 01/08/2015 0850   CL 101 01/29/2016 1002   CL 101 01/08/2015 0850   CO2 28 01/29/2016 1002   CO2 26 01/08/2015 0850   GLUCOSE 89 01/29/2016 1002   GLUCOSE 161* 01/08/2015 0850   BUN 17 01/29/2016 1002   BUN 17  01/08/2015 0850   CREATININE 0.89 01/29/2016 1002   CREATININE 0.82 01/22/2015 1559   CALCIUM 9.5 01/29/2016 1002   CALCIUM 9.3 01/08/2015 0850   PROT 7.5 01/29/2016 1002   PROT 6.8 01/22/2015 1559   ALBUMIN 4.5 01/29/2016 1002   ALBUMIN 3.9 01/22/2015 1559   AST 31 01/29/2016 1002   AST 34 01/22/2015 1559   ALT 25 01/29/2016 1002   ALT 42 01/22/2015 1559   ALKPHOS 49 01/29/2016 1002   ALKPHOS 157* 01/22/2015 1559   BILITOT 0.6 01/29/2016 1002   BILITOT 0.2* 01/22/2015 1559   GFRNONAA >60 01/29/2016 1002   GFRNONAA >60 01/22/2015 1559   GFRAA >60 01/29/2016 1002  GFRAA >60 01/22/2015 1559    No results found for: SPEP, UPEP  Lab Results  Component Value Date   WBC 3.7 09/26/2015   NEUTROABS 1.9 09/26/2015   HGB 13.6 09/26/2015   HCT 40.5 09/26/2015   MCV 89.3 09/26/2015   PLT 216 09/26/2015      Chemistry      Component Value Date/Time   NA 138 01/29/2016 1002   NA 135 01/08/2015 0850   K 4.8 01/29/2016 1002   K 3.7 01/08/2015 0850   CL 101 01/29/2016 1002   CL 101 01/08/2015 0850   CO2 28 01/29/2016 1002   CO2 26 01/08/2015 0850   BUN 17 01/29/2016 1002   BUN 17 01/08/2015 0850   CREATININE 0.89 01/29/2016 1002   CREATININE 0.82 01/22/2015 1559      Component Value Date/Time   CALCIUM 9.5 01/29/2016 1002   CALCIUM 9.3 01/08/2015 0850   ALKPHOS 49 01/29/2016 1002   ALKPHOS 157* 01/22/2015 1559   AST 31 01/29/2016 1002   AST 34 01/22/2015 1559   ALT 25 01/29/2016 1002   ALT 42 01/22/2015 1559   BILITOT 0.6 01/29/2016 1002   BILITOT 0.2* 01/22/2015 1559       ASSESSMENT & PLAN:   # STAGE II LEFT BREAST CA ER/PR-POS; Her 2 NEU- POS- currently on adjuvant/maintenance Herceptin. Herceptin will hold since the 09/26/2015 because of ejection fraction dropped into 43% in April 2017. However May 2017 ejection fraction is up to 60% again. We will restart Herceptin back; she has finished so for 66 of a planned 110 mg/kg dose; she has 8 more treatments to go;  we will start this week. We will plan to get a MUGA scan again in 2 months. Also spoke with Dr. Fletcher Anon.   # PERIPHERAL NEUROPATHY- grade 2- from prior Taxotere chemotherapy. No improvement on Neurontin/Lyrica. Recommend starting Cymbalta 60 mg/day. Discussed potential side effects.   # aromatase inhibitor- tolerating well without any major side effects. Continue the same.  # Patient will follow-up in  3 weeks/Herceptin/ CBC CMP in 3 weeks.  # 25 minutes face-to-face with the patient discussing the above plan of care; more than 50% of time spent on prognosis/ natural history; counseling and coordination.    Cammie Sickle, MD 02/17/2016 2:53 PM

## 2016-02-17 NOTE — Progress Notes (Signed)
Patient state she took her last Lyrica this morning.  She states it has not helped her neuropathy very much and want to discuss another option to control her nerve pain.

## 2016-02-18 ENCOUNTER — Telehealth: Payer: Self-pay | Admitting: *Deleted

## 2016-02-18 DIAGNOSIS — G620 Drug-induced polyneuropathy: Secondary | ICD-10-CM

## 2016-02-18 DIAGNOSIS — T451X5A Adverse effect of antineoplastic and immunosuppressive drugs, initial encounter: Principal | ICD-10-CM

## 2016-02-18 MED ORDER — DULOXETINE HCL 60 MG PO CPEP: 60.0000 mg | ORAL_CAPSULE | Freq: Every day | ORAL | Status: DC

## 2016-02-18 NOTE — Telephone Encounter (Signed)
30 day supply sent to local pharmacy

## 2016-02-21 ENCOUNTER — Inpatient Hospital Stay: Payer: BLUE CROSS/BLUE SHIELD

## 2016-02-21 VITALS — BP 106/67 | HR 68 | Temp 97.2°F | Resp 18

## 2016-02-21 DIAGNOSIS — Z452 Encounter for adjustment and management of vascular access device: Secondary | ICD-10-CM | POA: Diagnosis not present

## 2016-02-21 DIAGNOSIS — Z803 Family history of malignant neoplasm of breast: Secondary | ICD-10-CM | POA: Diagnosis not present

## 2016-02-21 DIAGNOSIS — Z79899 Other long term (current) drug therapy: Secondary | ICD-10-CM | POA: Diagnosis not present

## 2016-02-21 DIAGNOSIS — I10 Essential (primary) hypertension: Secondary | ICD-10-CM | POA: Diagnosis not present

## 2016-02-21 DIAGNOSIS — C50412 Malignant neoplasm of upper-outer quadrant of left female breast: Secondary | ICD-10-CM

## 2016-02-21 DIAGNOSIS — Z79811 Long term (current) use of aromatase inhibitors: Secondary | ICD-10-CM | POA: Diagnosis not present

## 2016-02-21 DIAGNOSIS — F17210 Nicotine dependence, cigarettes, uncomplicated: Secondary | ICD-10-CM | POA: Diagnosis not present

## 2016-02-21 DIAGNOSIS — E785 Hyperlipidemia, unspecified: Secondary | ICD-10-CM | POA: Diagnosis not present

## 2016-02-21 DIAGNOSIS — G620 Drug-induced polyneuropathy: Secondary | ICD-10-CM | POA: Diagnosis not present

## 2016-02-21 DIAGNOSIS — C773 Secondary and unspecified malignant neoplasm of axilla and upper limb lymph nodes: Secondary | ICD-10-CM | POA: Diagnosis not present

## 2016-02-21 DIAGNOSIS — Z7982 Long term (current) use of aspirin: Secondary | ICD-10-CM | POA: Diagnosis not present

## 2016-02-21 DIAGNOSIS — T451X5S Adverse effect of antineoplastic and immunosuppressive drugs, sequela: Secondary | ICD-10-CM | POA: Diagnosis not present

## 2016-02-21 DIAGNOSIS — Z170 Estrogen receptor positive status [ER+]: Secondary | ICD-10-CM | POA: Diagnosis not present

## 2016-02-21 DIAGNOSIS — Z5112 Encounter for antineoplastic immunotherapy: Secondary | ICD-10-CM | POA: Diagnosis not present

## 2016-02-21 DIAGNOSIS — I252 Old myocardial infarction: Secondary | ICD-10-CM | POA: Diagnosis not present

## 2016-02-21 MED ORDER — ACETAMINOPHEN 325 MG PO TABS
650.0000 mg | ORAL_TABLET | Freq: Once | ORAL | Status: AC
Start: 2016-02-21 — End: 2016-02-21
  Administered 2016-02-21: 650 mg via ORAL
  Filled 2016-02-21: qty 2

## 2016-02-21 MED ORDER — HEPARIN SOD (PORK) LOCK FLUSH 100 UNIT/ML IV SOLN
500.0000 [IU] | Freq: Once | INTRAVENOUS | Status: AC | PRN
  Administered 2016-02-21: 500 [IU]
  Filled 2016-02-21: qty 5

## 2016-02-21 MED ORDER — DIPHENHYDRAMINE HCL 25 MG PO CAPS
50.0000 mg | ORAL_CAPSULE | Freq: Once | ORAL | Status: AC
  Administered 2016-02-21: 25 mg via ORAL
  Filled 2016-02-21: qty 2

## 2016-02-21 MED ORDER — TRASTUZUMAB CHEMO INJECTION 440 MG
8.0000 mg/kg | Freq: Once | INTRAVENOUS | Status: AC
  Administered 2016-02-21: 588 mg via INTRAVENOUS
  Filled 2016-02-21: qty 28

## 2016-02-21 MED ORDER — SODIUM CHLORIDE 0.9% FLUSH
10.0000 mL | INTRAVENOUS | Status: DC | PRN
  Filled 2016-02-21: qty 10

## 2016-02-21 MED ORDER — SODIUM CHLORIDE 0.9 % IV SOLN
Freq: Once | INTRAVENOUS | Status: AC
  Administered 2016-02-21: 14:00:00 via INTRAVENOUS
  Filled 2016-02-21: qty 1000

## 2016-03-13 ENCOUNTER — Inpatient Hospital Stay: Payer: BLUE CROSS/BLUE SHIELD

## 2016-03-13 ENCOUNTER — Inpatient Hospital Stay (HOSPITAL_BASED_OUTPATIENT_CLINIC_OR_DEPARTMENT_OTHER): Payer: BLUE CROSS/BLUE SHIELD | Admitting: Internal Medicine

## 2016-03-13 ENCOUNTER — Inpatient Hospital Stay: Payer: BLUE CROSS/BLUE SHIELD | Attending: Internal Medicine

## 2016-03-13 VITALS — BP 146/92 | HR 68 | Temp 96.7°F | Resp 18 | Wt 165.3 lb

## 2016-03-13 DIAGNOSIS — T451X5S Adverse effect of antineoplastic and immunosuppressive drugs, sequela: Secondary | ICD-10-CM | POA: Insufficient documentation

## 2016-03-13 DIAGNOSIS — Z79811 Long term (current) use of aromatase inhibitors: Secondary | ICD-10-CM

## 2016-03-13 DIAGNOSIS — I252 Old myocardial infarction: Secondary | ICD-10-CM | POA: Insufficient documentation

## 2016-03-13 DIAGNOSIS — G620 Drug-induced polyneuropathy: Secondary | ICD-10-CM

## 2016-03-13 DIAGNOSIS — C50412 Malignant neoplasm of upper-outer quadrant of left female breast: Secondary | ICD-10-CM

## 2016-03-13 DIAGNOSIS — Z170 Estrogen receptor positive status [ER+]: Secondary | ICD-10-CM | POA: Diagnosis not present

## 2016-03-13 DIAGNOSIS — Z7982 Long term (current) use of aspirin: Secondary | ICD-10-CM

## 2016-03-13 DIAGNOSIS — I429 Cardiomyopathy, unspecified: Secondary | ICD-10-CM | POA: Diagnosis not present

## 2016-03-13 DIAGNOSIS — C773 Secondary and unspecified malignant neoplasm of axilla and upper limb lymph nodes: Secondary | ICD-10-CM | POA: Insufficient documentation

## 2016-03-13 DIAGNOSIS — I10 Essential (primary) hypertension: Secondary | ICD-10-CM | POA: Insufficient documentation

## 2016-03-13 DIAGNOSIS — Z79899 Other long term (current) drug therapy: Secondary | ICD-10-CM | POA: Insufficient documentation

## 2016-03-13 DIAGNOSIS — E785 Hyperlipidemia, unspecified: Secondary | ICD-10-CM | POA: Diagnosis not present

## 2016-03-13 DIAGNOSIS — M810 Age-related osteoporosis without current pathological fracture: Secondary | ICD-10-CM

## 2016-03-13 DIAGNOSIS — Z803 Family history of malignant neoplasm of breast: Secondary | ICD-10-CM | POA: Insufficient documentation

## 2016-03-13 DIAGNOSIS — F17210 Nicotine dependence, cigarettes, uncomplicated: Secondary | ICD-10-CM | POA: Insufficient documentation

## 2016-03-13 DIAGNOSIS — Z5112 Encounter for antineoplastic immunotherapy: Secondary | ICD-10-CM | POA: Insufficient documentation

## 2016-03-13 DIAGNOSIS — Z923 Personal history of irradiation: Secondary | ICD-10-CM

## 2016-03-13 DIAGNOSIS — T451X5A Adverse effect of antineoplastic and immunosuppressive drugs, initial encounter: Principal | ICD-10-CM

## 2016-03-13 DIAGNOSIS — Z5111 Encounter for antineoplastic chemotherapy: Secondary | ICD-10-CM

## 2016-03-13 DIAGNOSIS — C50312 Malignant neoplasm of lower-inner quadrant of left female breast: Secondary | ICD-10-CM

## 2016-03-13 LAB — COMPREHENSIVE METABOLIC PANEL
ALBUMIN: 4.1 g/dL (ref 3.5–5.0)
ALT: 31 U/L (ref 14–54)
ANION GAP: 7 (ref 5–15)
AST: 27 U/L (ref 15–41)
Alkaline Phosphatase: 51 U/L (ref 38–126)
BILIRUBIN TOTAL: 1 mg/dL (ref 0.3–1.2)
BUN: 20 mg/dL (ref 6–20)
CHLORIDE: 101 mmol/L (ref 101–111)
CO2: 28 mmol/L (ref 22–32)
Calcium: 8.8 mg/dL — ABNORMAL LOW (ref 8.9–10.3)
Creatinine, Ser: 0.91 mg/dL (ref 0.44–1.00)
GFR calc Af Amer: >60 mL/min (ref >60)
GFR calc non Af Amer: >60 mL/min (ref >60)
GLUCOSE: 87 mg/dL (ref 65–99)
POTASSIUM: 3.6 mmol/L (ref 3.5–5.1)
Sodium: 136 mmol/L (ref 135–145)
TOTAL PROTEIN: 7 g/dL (ref 6.5–8.1)

## 2016-03-13 LAB — CBC WITH DIFFERENTIAL/PLATELET
BASOS ABS: 0 10*3/uL (ref 0–0.1)
BASOS PCT: 0 %
EOS ABS: 0.2 10*3/uL (ref 0–0.7)
EOS PCT: 3 %
HEMATOCRIT: 38.2 % (ref 35.0–47.0)
Hemoglobin: 13.2 g/dL (ref 12.0–16.0)
Lymphocytes Relative: 36 %
Lymphs Abs: 1.7 10*3/uL (ref 1.0–3.6)
MCH: 32.1 pg (ref 26.0–34.0)
MCHC: 34.4 g/dL (ref 32.0–36.0)
MCV: 93.2 fL (ref 80.0–100.0)
MONO ABS: 0.3 10*3/uL (ref 0.2–0.9)
MONOS PCT: 6 %
Neutro Abs: 2.7 10*3/uL (ref 1.4–6.5)
Neutrophils Relative %: 55 %
PLATELETS: 218 10*3/uL (ref 150–440)
RBC: 4.1 MIL/uL (ref 3.80–5.20)
RDW: 13.3 % (ref 11.5–14.5)
WBC: 4.9 10*3/uL (ref 3.6–11.0)

## 2016-03-13 MED ORDER — HEPARIN SOD (PORK) LOCK FLUSH 100 UNIT/ML IV SOLN
500.0000 [IU] | Freq: Once | INTRAVENOUS | Status: AC | PRN
  Administered 2016-03-13: 500 [IU]
  Filled 2016-03-13: qty 5

## 2016-03-13 MED ORDER — TRASTUZUMAB CHEMO INJECTION 440 MG
440.0000 mg | Freq: Once | INTRAVENOUS | Status: AC
  Administered 2016-03-13: 440 mg via INTRAVENOUS
  Filled 2016-03-13: qty 20.95

## 2016-03-13 MED ORDER — DIPHENHYDRAMINE HCL 25 MG PO CAPS
50.0000 mg | ORAL_CAPSULE | Freq: Once | ORAL | Status: AC
  Administered 2016-03-13: 25 mg via ORAL
  Filled 2016-03-13: qty 2

## 2016-03-13 MED ORDER — SODIUM CHLORIDE 0.9% FLUSH
10.0000 mL | INTRAVENOUS | Status: DC | PRN
  Administered 2016-03-13: 10 mL
  Filled 2016-03-13: qty 10

## 2016-03-13 MED ORDER — ACETAMINOPHEN 325 MG PO TABS
650.0000 mg | ORAL_TABLET | Freq: Once | ORAL | Status: AC
  Administered 2016-03-13: 650 mg via ORAL
  Filled 2016-03-13: qty 2

## 2016-03-13 MED ORDER — SODIUM CHLORIDE 0.9 % IV SOLN
Freq: Once | INTRAVENOUS | Status: AC
  Administered 2016-03-13: 15:00:00 via INTRAVENOUS
  Filled 2016-03-13: qty 1000

## 2016-03-13 NOTE — Assessment & Plan Note (Signed)
Grade 1- improvement noted on Cymbalta. Continue the same.

## 2016-03-13 NOTE — Progress Notes (Signed)
[place] OFFICE PROGRESS NOTE  Patient Care Team: [name], MD as PCP - General (Family Medicine) [name], MD (General Surgery) [name], MD as Attending Physician (Internal Medicine)  No matching staging information was found for the patient.   Oncology History   # LEFT BREAST IDC; STAGE II [cT2N1] ER >90%; PR- 50-90%; her 2 NEU- POS; s/p Neoadj chemo; AUG 2016- TCH+P s/p Lumpec & partial ALND- path CR; s/p RT;  adj Herceptin; HELD for Jan 19th 2017 [in DEC 2016-EF dropped from 63 to 51%; FEB 27th EF-42.9%]; JAN 2016 START Arimidex; May 2017- EF- 60%; May 24th 2017-Re-start Herceptin q 3W  # PN- G-2; may 2017- Start cymbalta 90m/d  # April 2016- Liver Bx- NEG     Breast cancer of upper-outer quadrant of left female breast ([facility]   12/27/2014 Initial Diagnosis Breast cancer of upper-outer quadrant of left female breast ([facility]     INTERVAL HISTORY:  A very pleasant 60year old female patient with above history of stage II ER/PR positive HER-2/neu positive breast cancer currently on adjuvant/maintenance Herceptin is here for follow-up/Proceed with Herceptin.  Patient restarted back on Herceptin 3 weeks ago; which was held because of drop in ejection fraction.  Patient denies any shortness of breath. Denies any chest pain. Denies any swelling in the legs. No headaches vision changes or double vision.  She continues to complain of tingling and numbness of her hands and feet- however improved since starting on Cymbalta.  REVIEW OF SYSTEMS:  A complete 10 point review of system is done which is negative except mentioned above/history of present illness.   PAST MEDICAL HISTORY :  Past Medical History  Diagnosis Date  . Hypertension   . Hyperlipidemia   . Heart attack ([place] 1998  . Neuropathy ([facility]   . Neuromuscular disorder (HCC)     neuropathy  . Chemotherapy-induced peripheral neuropathy ([facility]   . Cancer ([facility] 2016    Stage II, ER  positive, PR positive, HER-2/neu overexpressing of the left breast.  . Breast cancer ([facility] 2016    Left-radiation    PAST SURGICAL HISTORY :   Past Surgical History  Procedure Laterality Date  . Portacath placement Right 12-31-14    Dr BBary Castilla . Breast lumpectomy with sentinel lymph node biopsy Left 05/23/2015    Procedure: LEFT BREAST WIDE EXCISION WITH AXILLARY DISSECTION, MASTOPLASTY ;  Surgeon: JRobert Bellow MD;  Location: [facility] ORS;  Service: General;  Laterality: Left;  . Breast surgery Left 12/18/14    breast biopsy/INVASIVE DUCTAL CARCINOMA OF BREAST, NOTTINGHAM GRADE 2.  . Breast surgery  05/23/2015.    Wide excision/mastoplasty, axillary dissection. No residual invasive cancer, positive for residual DCIS. 0/2 nodes identified on axillary dissection. (no SLN by technetium or methylene blue)  . Cardiac catheterization    . Breast biopsy Left 2016    Positive    FAMILY HISTORY :   Family History  Problem Relation Age of Onset  . Breast cancer Maternal Aunt   . Breast cancer Cousin   . Brain cancer Maternal Uncle     SOCIAL HISTORY:   Social History  Substance Use Topics  . Smoking status: Current Every Day Smoker -- 0.50 packs/day for 18 years    Types: Cigarettes  . Smokeless tobacco: Not on file  . Alcohol Use: No    ALLERGIES:  is allergic to no known allergies.  MEDICATIONS:  Current Outpatient Prescriptions  Medication Sig Dispense Refill  . anastrozole (  ARIMIDEX) 1 MG tablet Take 1 tablet (1 mg total) by mouth daily. 90 tablet 3  . aspirin 81 MG tablet Take 81 mg by mouth daily.    . carvedilol (COREG) 3.125 MG tablet Take 1 tablet (3.125 mg total) by mouth 2 (two) times daily. 180 tablet 3  . DULoxetine (CYMBALTA) 60 MG capsule Take 1 capsule (60 mg total) by mouth daily. 30 capsule 0  . lisinopril (PRINIVIL,ZESTRIL) 10 MG tablet Take 1 tablet (10 mg total) by mouth daily. 90 tablet 0  . pregabalin (LYRICA) 75 MG capsule Take 1 capsule (75 mg total) by  mouth 2 (two) times daily. 60 capsule 3  . simvastatin (ZOCOR) 40 MG tablet Take 1 tablet (40 mg total) by mouth daily at 6 PM. 90 tablet 0   No current facility-administered medications for this visit.   Facility-Administered Medications Ordered in Other Visits  Medication Dose Route Frequency Provider Last Rate Last Dose  . 0.9 %  sodium chloride infusion   Intravenous Continuous [name], MD      . 0.9 %  sodium chloride infusion   Intravenous Continuous Lloyd Huger, MD 999 mL/hr at 04/24/15 1520    . heparin lock flush 100 unit/mL  500 Units Intravenous Once Dmitriy Berenzon, MD      . sodium chloride 0.9 % injection 10 mL  10 mL Intravenous PRN [name], MD   10 mL at 04/04/15 1440  . sodium chloride flush (NS) 0.9 % injection 10 mL  10 mL Intravenous PRN Dmitriy Berenzon, MD      . sodium chloride flush (NS) 0.9 % injection 10 mL  10 mL Intracatheter PRN Cammie Sickle, MD   10 mL at 03/13/16 1330    PHYSICAL EXAMINATION: ECOG PERFORMANCE STATUS: 0 - Asymptomatic  BP 146/92 mmHg  Pulse 68  Temp(Src) 96.7 F (35.9 C) (Tympanic)  Resp 18  Wt 165 lb 5.5 oz (75 kg)  Filed Weights   03/13/16 1344  Weight: 165 lb 5.5 oz (75 kg)    GENERAL: Well-nourished well-developed; Alert, no distress and comfortable.  Alone. EYES: no pallor or icterus OROPHARYNX: no thrush or ulceration; good dentition  NECK: supple, no masses felt LYMPH:  no palpable lymphadenopathy in the cervical, axillary or inguinal regions LUNGS: clear to auscultation and  No wheeze or crackles HEART/CVS: regular rate & rhythm and no murmurs; No lower extremity edema ABDOMEN:abdomen soft, non-tender and normal bowel sounds Musculoskeletal:no cyanosis of digits and no clubbing  PSYCH: alert & oriented x 3 with fluent speech NEURO: no focal motor/sensory deficits   LABORATORY DATA:  I have reviewed the data as listed    Component Value Date/Time   NA 136 03/13/2016 1305   NA 135  01/08/2015 0850   K 3.6 03/13/2016 1305   K 3.7 01/08/2015 0850   CL 101 03/13/2016 1305   CL 101 01/08/2015 0850   CO2 28 03/13/2016 1305   CO2 26 01/08/2015 0850   GLUCOSE 87 03/13/2016 1305   GLUCOSE 161* 01/08/2015 0850   BUN 20 03/13/2016 1305   BUN 17 01/08/2015 0850   CREATININE 0.91 03/13/2016 1305   CREATININE 0.82 01/22/2015 1559   CALCIUM 8.8* 03/13/2016 1305   CALCIUM 9.3 01/08/2015 0850   PROT 7.0 03/13/2016 1305   PROT 6.8 01/22/2015 1559   ALBUMIN 4.1 03/13/2016 1305   ALBUMIN 3.9 01/22/2015 1559   AST 27 03/13/2016 1305   AST 34 01/22/2015 1559   ALT 31 03/13/2016 1305  ALT 42 01/22/2015 1559   ALKPHOS 51 03/13/2016 1305   ALKPHOS 157* 01/22/2015 1559   BILITOT 1.0 03/13/2016 1305   BILITOT 0.2* 01/22/2015 1559   GFRNONAA >60 03/13/2016 1305   GFRNONAA >60 01/22/2015 1559   GFRAA >60 03/13/2016 1305   GFRAA >60 01/22/2015 1559    No results found for: SPEP, UPEP  Lab Results  Component Value Date   WBC 4.9 03/13/2016   NEUTROABS 2.7 03/13/2016   HGB 13.2 03/13/2016   HCT 38.2 03/13/2016   MCV 93.2 03/13/2016   PLT 218 03/13/2016      Chemistry      Component Value Date/Time   NA 136 03/13/2016 1305   NA 135 01/08/2015 0850   K 3.6 03/13/2016 1305   K 3.7 01/08/2015 0850   CL 101 03/13/2016 1305   CL 101 01/08/2015 0850   CO2 28 03/13/2016 1305   CO2 26 01/08/2015 0850   BUN 20 03/13/2016 1305   BUN 17 01/08/2015 0850   CREATININE 0.91 03/13/2016 1305   CREATININE 0.82 01/22/2015 1559      Component Value Date/Time   CALCIUM 8.8* 03/13/2016 1305   CALCIUM 9.3 01/08/2015 0850   ALKPHOS 51 03/13/2016 1305   ALKPHOS 157* 01/22/2015 1559   AST 27 03/13/2016 1305   AST 34 01/22/2015 1559   ALT 31 03/13/2016 1305   ALT 42 01/22/2015 1559   BILITOT 1.0 03/13/2016 1305   BILITOT 0.2* 01/22/2015 1559            LABORATORY DATA:  I have reviewed the data as listed    Component Value Date/Time   NA 136 03/13/2016 1305   NA 135  01/08/2015 0850   K 3.6 03/13/2016 1305   K 3.7 01/08/2015 0850   CL 101 03/13/2016 1305   CL 101 01/08/2015 0850   CO2 28 03/13/2016 1305   CO2 26 01/08/2015 0850   GLUCOSE 87 03/13/2016 1305   GLUCOSE 161* 01/08/2015 0850   BUN 20 03/13/2016 1305   BUN 17 01/08/2015 0850   CREATININE 0.91 03/13/2016 1305   CREATININE 0.82 01/22/2015 1559   CALCIUM 8.8* 03/13/2016 1305   CALCIUM 9.3 01/08/2015 0850   PROT 7.0 03/13/2016 1305   PROT 6.8 01/22/2015 1559   ALBUMIN 4.1 03/13/2016 1305   ALBUMIN 3.9 01/22/2015 1559   AST 27 03/13/2016 1305   AST 34 01/22/2015 1559   ALT 31 03/13/2016 1305   ALT 42 01/22/2015 1559   ALKPHOS 51 03/13/2016 1305   ALKPHOS 157* 01/22/2015 1559   BILITOT 1.0 03/13/2016 1305   BILITOT 0.2* 01/22/2015 1559   GFRNONAA >60 03/13/2016 1305   GFRNONAA >60 01/22/2015 1559   GFRAA >60 03/13/2016 1305   GFRAA >60 01/22/2015 1559    No results found for: SPEP, UPEP  Lab Results  Component Value Date   WBC 4.9 03/13/2016   NEUTROABS 2.7 03/13/2016   HGB 13.2 03/13/2016   HCT 38.2 03/13/2016   MCV 93.2 03/13/2016   PLT 218 03/13/2016      Chemistry      Component Value Date/Time   NA 136 03/13/2016 1305   NA 135 01/08/2015 0850   K 3.6 03/13/2016 1305   K 3.7 01/08/2015 0850   CL 101 03/13/2016 1305   CL 101 01/08/2015 0850   CO2 28 03/13/2016 1305   CO2 26 01/08/2015 0850   BUN 20 03/13/2016 1305   BUN 17 01/08/2015 0850   CREATININE 0.91 03/13/2016 1305  CREATININE 0.82 01/22/2015 1559      Component Value Date/Time   CALCIUM 8.8* 03/13/2016 1305   CALCIUM 9.3 01/08/2015 0850   ALKPHOS 51 03/13/2016 1305   ALKPHOS 157* 01/22/2015 1559   AST 27 03/13/2016 1305   AST 34 01/22/2015 1559   ALT 31 03/13/2016 1305   ALT 42 01/22/2015 1559   BILITOT 1.0 03/13/2016 1305   BILITOT 0.2* 01/22/2015 1559       RADIOGRAPHIC STUDIES: I have personally reviewed the radiological images as listed and agreed with the findings in the  report. No results found.   ASSESSMENT & PLAN:  Cardiomyopathy ([place]) Secondary to Herceptin; asymptomatic. Most recent ejection fraction May 18th 2017-60% baseline. Repeat MUGA scan in the end of July 2017.  Encounter for antineoplastic chemotherapy Continue Herceptin every 3 weeks. Labs are adequate. No clinical signs and symptoms of CHF.  Peripheral neuropathy due to chemotherapy (HCC) Grade 1- improvement noted on Cymbalta. Continue the same.   Breast cancer of upper-outer quadrant of left female breast ([place]) Stage II breast cancer-ER/PR positive HER-2/neu positive. Currently on adjuvant Herceptin. No clinical evidence of recurrence. Continue AI.    Orders Placed This Encounter  Procedures  . NM Cardiac [facility]uga Rest    Standing Status: Future     Number of Occurrences:      Standing Expiration Date: 03/13/2017    Order Specific Question:  Reason for Exam (SYMPTOM  OR DIAGNOSIS REQUIRED)    Answer:  pt has breast cancer, on herceptin and has dec. EF and med wa placed on hold, re eval. EF    Order Specific Question:  Preferred imaging location?    Answer:  [facility]    Order Specific Question:  If indicated for the ordered procedure, I authorize the administration of a radiopharmaceutical per Radiology protocol    Answer:  Yes  . CBC with Differential/Platelet    Standing Status: Future     Number of Occurrences:      Standing Expiration Date: 03/13/2017  . Comprehensive metabolic panel    Standing Status: Future     Number of Occurrences:      Standing Expiration Date: 03/13/2017   All questions were answered. The patient knows to call the clinic with any problems, questions or concerns.      Cammie Sickle, MD 03/13/2016 7:00 PM

## 2016-03-13 NOTE — Assessment & Plan Note (Signed)
Secondary to Herceptin; asymptomatic. Most recent ejection fraction May 18th 2017-60% baseline. Repeat MUGA scan in the end of July 2017.

## 2016-03-13 NOTE — Assessment & Plan Note (Signed)
Continue Herceptin every 3 weeks. Labs are adequate. No clinical signs and symptoms of CHF.

## 2016-03-13 NOTE — Assessment & Plan Note (Signed)
Stage II breast cancer-ER/PR positive HER-2/neu positive. Currently on adjuvant Herceptin. No clinical evidence of recurrence. Continue AI.

## 2016-03-30 ENCOUNTER — Other Ambulatory Visit: Payer: Self-pay | Admitting: Family Medicine

## 2016-04-01 ENCOUNTER — Telehealth: Payer: Self-pay | Admitting: *Deleted

## 2016-04-01 NOTE — Telephone Encounter (Signed)
FMLA faxed to [facility]  Also mailed patient copy.

## 2016-04-03 ENCOUNTER — Inpatient Hospital Stay: Payer: BLUE CROSS/BLUE SHIELD | Attending: Internal Medicine | Admitting: Internal Medicine

## 2016-04-03 ENCOUNTER — Inpatient Hospital Stay: Payer: BLUE CROSS/BLUE SHIELD

## 2016-04-03 VITALS — BP 128/85 | HR 106 | Temp 97.1°F | Resp 18 | Wt 164.9 lb

## 2016-04-03 DIAGNOSIS — Z170 Estrogen receptor positive status [ER+]: Secondary | ICD-10-CM

## 2016-04-03 DIAGNOSIS — C50412 Malignant neoplasm of upper-outer quadrant of left female breast: Secondary | ICD-10-CM | POA: Insufficient documentation

## 2016-04-03 DIAGNOSIS — I10 Essential (primary) hypertension: Secondary | ICD-10-CM | POA: Diagnosis not present

## 2016-04-03 DIAGNOSIS — R200 Anesthesia of skin: Secondary | ICD-10-CM | POA: Diagnosis not present

## 2016-04-03 DIAGNOSIS — E785 Hyperlipidemia, unspecified: Secondary | ICD-10-CM

## 2016-04-03 DIAGNOSIS — Z923 Personal history of irradiation: Secondary | ICD-10-CM | POA: Diagnosis not present

## 2016-04-03 DIAGNOSIS — Z803 Family history of malignant neoplasm of breast: Secondary | ICD-10-CM | POA: Diagnosis not present

## 2016-04-03 DIAGNOSIS — R202 Paresthesia of skin: Secondary | ICD-10-CM | POA: Diagnosis not present

## 2016-04-03 DIAGNOSIS — Z5111 Encounter for antineoplastic chemotherapy: Secondary | ICD-10-CM | POA: Insufficient documentation

## 2016-04-03 DIAGNOSIS — Z79899 Other long term (current) drug therapy: Secondary | ICD-10-CM | POA: Diagnosis not present

## 2016-04-03 DIAGNOSIS — G629 Polyneuropathy, unspecified: Secondary | ICD-10-CM | POA: Diagnosis not present

## 2016-04-03 DIAGNOSIS — I252 Old myocardial infarction: Secondary | ICD-10-CM | POA: Insufficient documentation

## 2016-04-03 DIAGNOSIS — Z79811 Long term (current) use of aromatase inhibitors: Secondary | ICD-10-CM

## 2016-04-03 DIAGNOSIS — F17210 Nicotine dependence, cigarettes, uncomplicated: Secondary | ICD-10-CM

## 2016-04-03 DIAGNOSIS — Z7982 Long term (current) use of aspirin: Secondary | ICD-10-CM

## 2016-04-03 MED ORDER — TRASTUZUMAB CHEMO 150 MG IV SOLR
440.0000 mg | Freq: Once | INTRAVENOUS | Status: AC
  Administered 2016-04-03: 440 mg via INTRAVENOUS
  Filled 2016-04-03: qty 21

## 2016-04-03 MED ORDER — DIPHENHYDRAMINE HCL 25 MG PO CAPS
25.0000 mg | ORAL_CAPSULE | Freq: Once | ORAL | Status: AC
  Administered 2016-04-03: 25 mg via ORAL

## 2016-04-03 MED ORDER — DIPHENHYDRAMINE HCL 25 MG PO CAPS
50.0000 mg | ORAL_CAPSULE | Freq: Once | ORAL | Status: DC
  Filled 2016-04-03: qty 2

## 2016-04-03 MED ORDER — SODIUM CHLORIDE 0.9% FLUSH
10.0000 mL | INTRAVENOUS | Status: DC | PRN
  Administered 2016-04-03: 10 mL via INTRAVENOUS
  Filled 2016-04-03: qty 10

## 2016-04-03 MED ORDER — ACETAMINOPHEN 325 MG PO TABS
650.0000 mg | ORAL_TABLET | Freq: Once | ORAL | Status: AC
  Administered 2016-04-03: 650 mg via ORAL
  Filled 2016-04-03: qty 2

## 2016-04-03 MED ORDER — SODIUM CHLORIDE 0.9 % IV SOLN
Freq: Once | INTRAVENOUS | Status: AC
  Administered 2016-04-03: 15:00:00 via INTRAVENOUS
  Filled 2016-04-03: qty 1000

## 2016-04-03 MED ORDER — HEPARIN SOD (PORK) LOCK FLUSH 100 UNIT/ML IV SOLN
500.0000 [IU] | Freq: Once | INTRAVENOUS | Status: AC
  Administered 2016-04-03: 500 [IU] via INTRAVENOUS

## 2016-04-03 MED ORDER — HEPARIN SOD (PORK) LOCK FLUSH 100 UNIT/ML IV SOLN
INTRAVENOUS | Status: AC
  Filled 2016-04-03: qty 5

## 2016-04-03 NOTE — Progress Notes (Signed)
[name] OFFICE PROGRESS NOTE  Patient Care Team: [name], MD as PCP - General (Family Medicine) [name], MD (General Surgery) [name], MD as Attending Physician (Internal Medicine)  No matching staging information was found for the patient.   Oncology History   # LEFT BREAST IDC; STAGE II [cT2N1] ER >90%; PR- 50-90%; her 2 NEU- POS; s/p Neoadj chemo; AUG 2016- TCH+P s/p Lumpec & partial ALND- path CR; s/p RT;  adj Herceptin; HELD for Jan 19th 2017 [in DEC 2016-EF dropped from 63 to 51%; FEB 27th EF-42.9%]; JAN 2016 START Arimidex; May 2017- EF- 60%; May 24th 2017-Re-start Herceptin q 3W  # PN- G-2; may 2017- Start cymbalta 59m/d  # April 2016- Liver Bx- NEG     Breast cancer of upper-outer quadrant of left female breast ([facility]   12/27/2014 Initial Diagnosis [place]     INTERVAL HISTORY:  A very pleasant 60year old female patient with above history of stage II ER/PR positive HER-2/neu positive breast cancer currently on adjuvant/maintenance Herceptin is here for follow-up/Proceed with Herceptin. Multiple interruptions sec to Drop in EF. Restarted back 3 weeks ago.   Patient denies any shortness of breath. Denies any chest pain. Denies any swelling in the legs. No headaches vision changes or double vision.  She continues to complain of tingling and numbness of her hands and feet- however improved since starting on Cymbalta. Wants to know if increase the dose of cymblata.   REVIEW OF SYSTEMS:  A complete 10 point review of system is done which is negative except mentioned above/history of present illness.   PAST MEDICAL HISTORY :  Past Medical History  Diagnosis Date  . Hypertension   . Hyperlipidemia   . [place]  . Neuropathy ([facility]   . Neuromuscular disorder (HCC)     neuropathy  . Chemotherapy-induced peripheral neuropathy ([facility]   . Cancer ([facility] 2016   Stage II, ER positive, PR positive, HER-2/neu overexpressing of the left breast.  . Breast cancer ([facility] 2016    Left-radiation    PAST SURGICAL HISTORY :   Past Surgical History  Procedure Laterality Date  . Portacath placement Right 12-31-14    Dr BBary Castilla . Breast lumpectomy with sentinel lymph node biopsy Left 05/23/2015    Procedure: LEFT BREAST WIDE EXCISION WITH AXILLARY DISSECTION, MASTOPLASTY ;  Surgeon: JRobert Bellow MD;  Location: [facility] ORS;  Service: General;  Laterality: Left;  . Breast surgery Left 12/18/14    breast biopsy/INVASIVE DUCTAL CARCINOMA OF BREAST, NOTTINGHAM GRADE 2.  . Breast surgery  05/23/2015.    Wide excision/mastoplasty, axillary dissection. No residual invasive cancer, positive for residual DCIS. 0/2 nodes identified on axillary dissection. (no SLN by technetium or methylene blue)  . Cardiac catheterization    . Breast biopsy Left 2016    Positive    FAMILY HISTORY :   Family History  Problem Relation Age of Onset  . Breast cancer Maternal Aunt   . Breast cancer Cousin   . Brain cancer Maternal Uncle     SOCIAL HISTORY:   Social History  Substance Use Topics  . Smoking status: Current Every Day Smoker -- 0.50 packs/day for 18 years    Types: Cigarettes  . Smokeless tobacco: Not on file  . Alcohol Use: No    ALLERGIES:  is allergic to no known allergies.  MEDICATIONS:  Current Outpatient Prescriptions  Medication Sig Dispense Refill  .  anastrozole (ARIMIDEX) 1 MG tablet Take 1 tablet (1 mg total) by mouth daily. 90 tablet 3  . aspirin 81 MG tablet Take 81 mg by mouth daily.    . carvedilol (COREG) 3.125 MG tablet Take 1 tablet (3.125 mg total) by mouth 2 (two) times daily. 180 tablet 3  . DULoxetine (CYMBALTA) 60 MG capsule Take 1 capsule (60 mg total) by mouth daily. 30 capsule 0  . lisinopril (PRINIVIL,ZESTRIL) 10 MG tablet TAKE 1 TABLET DAILY 90 tablet 0  . pregabalin (LYRICA) 75 MG capsule Take 1 capsule (75 mg total) by mouth 2  (two) times daily. 60 capsule 3  . simvastatin (ZOCOR) 40 MG tablet TAKE 1 TABLET DAILY AT 6 P.M. 90 tablet 0   No current facility-administered medications for this visit.   Facility-Administered Medications Ordered in Other Visits  Medication Dose Route Frequency Provider Last Rate Last Dose  . 0.9 %  sodium chloride infusion   Intravenous Continuous [name], MD      . 0.9 %  sodium chloride infusion   Intravenous Continuous Lloyd Huger, MD 999 mL/hr at 04/24/15 1520    . heparin lock flush 100 unit/mL  500 Units Intravenous Once Dmitriy Berenzon, MD      . sodium chloride 0.9 % injection 10 mL  10 mL Intravenous PRN [name], MD   10 mL at 04/04/15 1440  . sodium chloride flush (NS) 0.9 % injection 10 mL  10 mL Intravenous PRN Dmitriy Berenzon, MD        PHYSICAL EXAMINATION: ECOG PERFORMANCE STATUS: 0 - Asymptomatic  BP 128/85 mmHg  Pulse 106  Temp(Src) 97.1 F (36.2 C) (Tympanic)  Resp 18  Wt 164 lb 14.5 oz (74.8 kg)  Filed Weights   04/03/16 1412  Weight: 164 lb 14.5 oz (74.8 kg)    GENERAL: Well-nourished well-developed; Alert, no distress and comfortable.  Alone. EYES: no pallor or icterus OROPHARYNX: no thrush or ulceration; good dentition  NECK: supple, no masses felt LYMPH:  no palpable lymphadenopathy in the cervical, axillary or inguinal regions LUNGS: clear to auscultation and  No wheeze or crackles HEART/CVS: regular rate & rhythm and no murmurs; No lower extremity edema ABDOMEN:abdomen soft, non-tender and normal bowel sounds Musculoskeletal:no cyanosis of digits and no clubbing  PSYCH: alert & oriented x 3 with fluent speech NEURO: no focal motor/sensory deficits   LABORATORY DATA:  I have reviewed the data as listed    Component Value Date/Time   NA 136 03/13/2016 1305   NA 135 01/08/2015 0850   K 3.6 03/13/2016 1305   K 3.7 01/08/2015 0850   CL 101 03/13/2016 1305   CL 101 01/08/2015 0850   CO2 28 03/13/2016 1305   CO2 26  01/08/2015 0850   GLUCOSE 87 03/13/2016 1305   GLUCOSE 161* 01/08/2015 0850   BUN 20 03/13/2016 1305   BUN 17 01/08/2015 0850   CREATININE 0.91 03/13/2016 1305   CREATININE 0.82 01/22/2015 1559   CALCIUM 8.8* 03/13/2016 1305   CALCIUM 9.3 01/08/2015 0850   PROT 7.0 03/13/2016 1305   PROT 6.8 01/22/2015 1559   ALBUMIN 4.1 03/13/2016 1305   ALBUMIN 3.9 01/22/2015 1559   AST 27 03/13/2016 1305   AST 34 01/22/2015 1559   ALT 31 03/13/2016 1305   ALT 42 01/22/2015 1559   ALKPHOS 51 03/13/2016 1305   ALKPHOS 157* 01/22/2015 1559   BILITOT 1.0 03/13/2016 1305   BILITOT 0.2* 01/22/2015 1559   GFRNONAA >60 03/13/2016 1305  GFRNONAA >60 01/22/2015 1559   GFRAA >60 03/13/2016 1305   GFRAA >60 01/22/2015 1559    No results found for: SPEP, UPEP  Lab Results  Component Value Date   WBC 4.9 03/13/2016   NEUTROABS 2.7 03/13/2016   HGB 13.2 03/13/2016   HCT 38.2 03/13/2016   MCV 93.2 03/13/2016   PLT 218 03/13/2016      Chemistry      Component Value Date/Time   NA 136 03/13/2016 1305   NA 135 01/08/2015 0850   K 3.6 03/13/2016 1305   K 3.7 01/08/2015 0850   CL 101 03/13/2016 1305   CL 101 01/08/2015 0850   CO2 28 03/13/2016 1305   CO2 26 01/08/2015 0850   BUN 20 03/13/2016 1305   BUN 17 01/08/2015 0850   CREATININE 0.91 03/13/2016 1305   CREATININE 0.82 01/22/2015 1559      Component Value Date/Time   CALCIUM 8.8* 03/13/2016 1305   CALCIUM 9.3 01/08/2015 0850   ALKPHOS 51 03/13/2016 1305   ALKPHOS 157* 01/22/2015 1559   AST 27 03/13/2016 1305   AST 34 01/22/2015 1559   ALT 31 03/13/2016 1305   ALT 42 01/22/2015 1559   BILITOT 1.0 03/13/2016 1305   BILITOT 0.2* 01/22/2015 1559            LABORATORY DATA:  I have reviewed the data as listed    Component Value Date/Time   NA 136 03/13/2016 1305   NA 135 01/08/2015 0850   K 3.6 03/13/2016 1305   K 3.7 01/08/2015 0850   CL 101 03/13/2016 1305   CL 101 01/08/2015 0850   CO2 28 03/13/2016 1305   CO2 26  01/08/2015 0850   GLUCOSE 87 03/13/2016 1305   GLUCOSE 161* 01/08/2015 0850   BUN 20 03/13/2016 1305   BUN 17 01/08/2015 0850   CREATININE 0.91 03/13/2016 1305   CREATININE 0.82 01/22/2015 1559   CALCIUM 8.8* 03/13/2016 1305   CALCIUM 9.3 01/08/2015 0850   PROT 7.0 03/13/2016 1305   PROT 6.8 01/22/2015 1559   ALBUMIN 4.1 03/13/2016 1305   ALBUMIN 3.9 01/22/2015 1559   AST 27 03/13/2016 1305   AST 34 01/22/2015 1559   ALT 31 03/13/2016 1305   ALT 42 01/22/2015 1559   ALKPHOS 51 03/13/2016 1305   ALKPHOS 157* 01/22/2015 1559   BILITOT 1.0 03/13/2016 1305   BILITOT 0.2* 01/22/2015 1559   GFRNONAA >60 03/13/2016 1305   GFRNONAA >60 01/22/2015 1559   GFRAA >60 03/13/2016 1305   GFRAA >60 01/22/2015 1559    No results found for: SPEP, UPEP  Lab Results  Component Value Date   WBC 4.9 03/13/2016   NEUTROABS 2.7 03/13/2016   HGB 13.2 03/13/2016   HCT 38.2 03/13/2016   MCV 93.2 03/13/2016   PLT 218 03/13/2016      Chemistry      Component Value Date/Time   NA 136 03/13/2016 1305   NA 135 01/08/2015 0850   K 3.6 03/13/2016 1305   K 3.7 01/08/2015 0850   CL 101 03/13/2016 1305   CL 101 01/08/2015 0850   CO2 28 03/13/2016 1305   CO2 26 01/08/2015 0850   BUN 20 03/13/2016 1305   BUN 17 01/08/2015 0850   CREATININE 0.91 03/13/2016 1305   CREATININE 0.82 01/22/2015 1559      Component Value Date/Time   CALCIUM 8.8* 03/13/2016 1305   CALCIUM 9.3 01/08/2015 0850   ALKPHOS 51 03/13/2016 1305   ALKPHOS 157* 01/22/2015 1559  AST 27 03/13/2016 1305   AST 34 01/22/2015 1559   ALT 31 03/13/2016 1305   ALT 42 01/22/2015 1559   BILITOT 1.0 03/13/2016 1305   BILITOT 0.2* 01/22/2015 1559       RADIOGRAPHIC STUDIES: I have personally reviewed the radiological images as listed and agreed with the findings in the report. No results found.   ASSESSMENT & PLAN:  Breast cancer of upper-outer quadrant of left female breast ([place]) Stage II breast cancer-ER/PR positive  HER-2/neu positive. Currently on adjuvant Herceptin. No clinical evidence of recurrence. Continue Anastrazole.   Secondary to Herceptin; asymptomatic. Most recent ejection fraction May 18th 2017-60% baseline. Repeat MUGA scan in the end of July 2017. Patient so far has received 50 mg/kg dose; 60 mg/kg dose including the treatment from today [10 more treatments including today]  Grade 1- improvement noted on Cymbalta. Continue the same; would not recommend increasing the dose  # Follow-up with me in 3 weeks labs Herceptin infusion.     No orders of the defined types were placed in this encounter.   All questions were answered. The patient knows to call the clinic with any problems, questions or concerns.      Sarah Sickle, MD 04/03/2016 2:32 PM

## 2016-04-03 NOTE — Assessment & Plan Note (Signed)
Stage II breast cancer-ER/PR positive HER-2/neu positive. Currently on adjuvant Herceptin. No clinical evidence of recurrence. Continue Anastrazole.   Secondary to Herceptin; asymptomatic. Most recent ejection fraction May 18th 2017-60% baseline. Repeat MUGA scan in the end of July 2017. Patient so far has received 50 mg/kg dose; 60 mg/kg dose including the treatment from today [10 more treatments including today]  Grade 1- improvement noted on Cymbalta. Continue the same; would not recommend increasing the dose  # Follow-up with me in 3 weeks labs Herceptin infusion.

## 2016-04-14 ENCOUNTER — Ambulatory Visit (INDEPENDENT_AMBULATORY_CARE_PROVIDER_SITE_OTHER): Payer: BLUE CROSS/BLUE SHIELD | Admitting: Cardiovascular Disease

## 2016-04-14 ENCOUNTER — Encounter: Payer: Self-pay | Admitting: Cardiovascular Disease

## 2016-04-14 ENCOUNTER — Other Ambulatory Visit: Payer: Self-pay | Admitting: Internal Medicine

## 2016-04-14 VITALS — BP 140/90 | HR 101 | Ht 66.0 in | Wt 165.8 lb

## 2016-04-14 DIAGNOSIS — I429 Cardiomyopathy, unspecified: Secondary | ICD-10-CM

## 2016-04-14 MED ORDER — CARVEDILOL 6.25 MG PO TABS: 6.2500 mg | ORAL_TABLET | Freq: Two times a day (BID) | ORAL | Status: DC

## 2016-04-14 NOTE — Progress Notes (Signed)
Cardiology Office Note   Date:  04/14/2016   ID:  MARGIA WIESEN, DOB May 04, 1956, MRN [id number]  PCP:  Keith Rake, MD  Cardiologist:   Kathlyn Sacramento, MD  Oncologist: Dr.Brahmanday  Chief Complaint  Patient presents with  . Cardiomyopathy    no sx      History of Present Illness: Sarah Horne is a 60 y.o. female who Is here today for a follow-up visit regarding Herceptin-induced cardiomyopathy. The patient reports possible prior myocardial infarction in 1998 where she underwent cardiac catheterization at [facility]. She did not require revascularization and she was treated medically. The details of that admission are not available. She has chronic medical conditions that include hypertension, hyperlipidemia and tobacco use . She was diagnosed with stage II breast cancer last year. She started treatment with Taxotere/Carboplatin/Herceptin/Perjeta in April 2016. She underwent a MUGA scan in July, 2016 which showed an ejection fraction of 63%. This dropped to 51% in December. Given that she was asymptomatic, Herceptin was given in December. She underwent a repeat MUGA scan on February 27 which showed a drop in ejection fraction to 44%. T Echocardiogram in March 2017 showed an ejection fraction of 45-50% with no significant valvular abnormalities or pulmonary hypertension. She was switched from atenolol to carvedilol.  She underwent repeat MUGA scan in May which showed an ejection fraction of 60%.Herceptin was resumed. She continues to do well with no chest pain or shortness of breath.    Past Medical History  Diagnosis Date  . Hypertension   . Hyperlipidemia   . Heart attack ([place]) 1998  . Neuropathy ([place])   . Neuromuscular disorder (HCC)     neuropathy  . Chemotherapy-induced peripheral neuropathy ([place])   . Cancer ([place]) 2016    Stage II, ER positive, PR positive, HER-2/neu overexpressing of the left breast.  . Breast cancer ([place]) 2016    Left-radiation    Past Surgical History   Procedure Laterality Date  . Portacath placement Right 12-31-14    Dr Bary Castilla  . Breast lumpectomy with sentinel lymph node biopsy Left 05/23/2015    Procedure: LEFT BREAST WIDE EXCISION WITH AXILLARY DISSECTION, MASTOPLASTY ;  Surgeon: [name], MD;  Location: [facility] ORS;  Service: General;  Laterality: Left;  . Breast surgery Left 12/18/14    breast biopsy/INVASIVE DUCTAL CARCINOMA OF BREAST, NOTTINGHAM GRADE 2.  . Breast surgery  05/23/2015.    Wide excision/mastoplasty, axillary dissection. No residual invasive cancer, positive for residual DCIS. 0/2 nodes identified on axillary dissection. (no SLN by technetium or methylene blue)  . Cardiac catheterization    . Breast biopsy Left 2016    Positive     Current Outpatient Prescriptions  Medication Sig Dispense Refill  . anastrozole (ARIMIDEX) 1 MG tablet Take 1 tablet (1 mg total) by mouth daily. 90 tablet 3  . aspirin 81 MG tablet Take 81 mg by mouth daily.    . carvedilol (COREG) 3.125 MG tablet Take 1 tablet (3.125 mg total) by mouth 2 (two) times daily. 180 tablet 3  . DULoxetine (CYMBALTA) 60 MG capsule TAKE 1 CAPSULE (60 MG TOTAL) BY MOUTH DAILY. 30 capsule 0  . lisinopril (PRINIVIL,ZESTRIL) 10 MG tablet TAKE 1 TABLET DAILY 90 tablet 0  . simvastatin (ZOCOR) 40 MG tablet TAKE 1 TABLET DAILY AT 6 P.M. 90 tablet 0   No current facility-administered medications for this visit.   Facility-Administered Medications Ordered in Other Visits  Medication Dose Route Frequency Provider Last Rate Last Dose  .  0.9 %  sodium chloride infusion   Intravenous Continuous [name], MD      . 0.9 %  sodium chloride infusion   Intravenous Continuous Lloyd Huger, MD 999 mL/hr at 04/24/15 1520    . heparin lock flush 100 unit/mL  500 Units Intravenous Once Dmitriy Berenzon, MD      . sodium chloride 0.9 % injection 10 mL  10 mL Intravenous PRN [name], MD   10 mL at 04/04/15 1440  . sodium chloride flush (NS) 0.9 % injection  10 mL  10 mL Intravenous PRN Roxana Hires, MD        Allergies:   No known allergies    Social History:  The patient  reports that she has been smoking Cigarettes.  She has a 9 pack-year smoking history. She does not have any smokeless tobacco history on file. She reports that she does not drink alcohol or use illicit drugs.   Family History:  The patient's family history includes Brain cancer in her maternal uncle; Breast cancer in her cousin and maternal aunt.    ROS:  Please see the history of present illness.   Otherwise, review of systems are positive for none.   All other systems are reviewed and negative.    PHYSICAL EXAM: VS:  BP 140/90 mmHg  Pulse 101  Ht 5' 6"  (1.676 m)  Wt 165 lb 12.8 oz (75.206 kg)  BMI 26.77 kg/m2  SpO2 97% , BMI Body mass index is 26.77 kg/(m^2). GEN: Well nourished, well developed, in no acute distress HEENT: normal Neck: no JVD, carotid bruits, or masses Cardiac: RRR; no murmurs, rubs, or gallops,no edema  Respiratory:  clear to auscultation bilaterally, normal work of breathing GI: soft, nontender, nondistended, + BS MS: no deformity or atrophy Skin: warm and dry, no rash Neuro:  Strength and sensation are intact Psych: euthymic mood, full affect   EKG:  EKG is not ordered today.   Recent Labs: 03/13/2016: ALT 31; BUN 20; Creatinine, Ser 0.91; Hemoglobin 13.2; Platelets 218; Potassium 3.6; Sodium 136    Lipid Panel    Component Value Date/Time   CHOL 130 01/29/2016 1002   TRIG 72 01/29/2016 1002   HDL 42 01/29/2016 1002   CHOLHDL 3.1 01/29/2016 1002   VLDL 14 01/29/2016 1002   LDLCALC 74 01/29/2016 1002      Wt Readings from Last 3 Encounters:  04/14/16 165 lb 12.8 oz (75.206 kg)  04/03/16 164 lb 14.5 oz (74.8 kg)  03/13/16 165 lb 5.5 oz (75 kg)         ASSESSMENT AND PLAN:  1.  Chemotherapy induced cardiomyopathy:   This was due to Herceptin with subsequent improvement in LV systolic function to normal. I elected to  increase the dose of carvedilol to 6.25 mg twice daily.  2. Essential hypertension: Blood pressure is mildly elevated. I increased carvedilol as outlined above.  3. Hyperlipidemia: Currently on simvastatin.  4. Tobacco use: She continues to smoke unfortunately.   Disposition:   FU with me in 4  month  Signed,  Kathlyn Sacramento, MD  04/14/2016 4:14 PM    [facility]

## 2016-04-14 NOTE — Patient Instructions (Signed)
Medication Instructions: Increase Carvedilol to 6.25 mg twice daily.   Labwork: None.   Procedures/Testing: None.   Follow-Up: 4 months with Dr. Fletcher Anon.   Any Additional Special Instructions Will Be Listed Below (If Applicable).     If you need a refill on your cardiac medications before your next appointment, please call your pharmacy.

## 2016-04-22 ENCOUNTER — Other Ambulatory Visit: Payer: Self-pay | Admitting: General Surgery

## 2016-04-22 ENCOUNTER — Encounter
Admission: RE | Admit: 2016-04-22 | Discharge: 2016-04-22 | Disposition: A | Discharge status: Home / Self Care | Payer: BLUE CROSS/BLUE SHIELD | Source: Ambulatory Visit | Attending: Internal Medicine | Admitting: Internal Medicine

## 2016-04-22 DIAGNOSIS — T451X5A Adverse effect of antineoplastic and immunosuppressive drugs, initial encounter: Secondary | ICD-10-CM | POA: Insufficient documentation

## 2016-04-22 DIAGNOSIS — C50412 Malignant neoplasm of upper-outer quadrant of left female breast: Secondary | ICD-10-CM | POA: Insufficient documentation

## 2016-04-22 DIAGNOSIS — C50919 Malignant neoplasm of unspecified site of unspecified female breast: Secondary | ICD-10-CM | POA: Diagnosis not present

## 2016-04-22 DIAGNOSIS — G620 Drug-induced polyneuropathy: Secondary | ICD-10-CM | POA: Insufficient documentation

## 2016-04-22 DIAGNOSIS — C50112 Malignant neoplasm of central portion of left female breast: Secondary | ICD-10-CM

## 2016-04-22 DIAGNOSIS — Z0189 Encounter for other specified special examinations: Secondary | ICD-10-CM | POA: Diagnosis not present

## 2016-04-22 DIAGNOSIS — X58XXXA Exposure to other specified factors, initial encounter: Secondary | ICD-10-CM | POA: Insufficient documentation

## 2016-04-22 MED ORDER — TECHNETIUM TC 99M-LABELED RED BLOOD CELLS IV KIT
20.9200 | PACK | Freq: Once | INTRAVENOUS | Status: AC | PRN
  Administered 2016-04-22: 20.92 via INTRAVENOUS

## 2016-04-23 ENCOUNTER — Ambulatory Visit: Payer: BLUE CROSS/BLUE SHIELD | Admitting: Radiation Oncology

## 2016-04-23 ENCOUNTER — Inpatient Hospital Stay
Admission: RE | Admit: 2016-04-23 | Payer: BLUE CROSS/BLUE SHIELD | Source: Ambulatory Visit | Admitting: Radiation Oncology

## 2016-04-24 ENCOUNTER — Inpatient Hospital Stay (HOSPITAL_BASED_OUTPATIENT_CLINIC_OR_DEPARTMENT_OTHER): Payer: BLUE CROSS/BLUE SHIELD | Admitting: Internal Medicine

## 2016-04-24 ENCOUNTER — Inpatient Hospital Stay: Payer: BLUE CROSS/BLUE SHIELD

## 2016-04-24 VITALS — BP 151/93 | HR 98 | Temp 98.0°F | Resp 18 | Wt 165.4 lb

## 2016-04-24 DIAGNOSIS — F17210 Nicotine dependence, cigarettes, uncomplicated: Secondary | ICD-10-CM | POA: Diagnosis not present

## 2016-04-24 DIAGNOSIS — G620 Drug-induced polyneuropathy: Secondary | ICD-10-CM

## 2016-04-24 DIAGNOSIS — Z5111 Encounter for antineoplastic chemotherapy: Secondary | ICD-10-CM | POA: Diagnosis not present

## 2016-04-24 DIAGNOSIS — C50412 Malignant neoplasm of upper-outer quadrant of left female breast: Secondary | ICD-10-CM

## 2016-04-24 DIAGNOSIS — I429 Cardiomyopathy, unspecified: Secondary | ICD-10-CM

## 2016-04-24 DIAGNOSIS — R202 Paresthesia of skin: Secondary | ICD-10-CM | POA: Diagnosis not present

## 2016-04-24 DIAGNOSIS — Z79811 Long term (current) use of aromatase inhibitors: Secondary | ICD-10-CM

## 2016-04-24 DIAGNOSIS — Z170 Estrogen receptor positive status [ER+]: Secondary | ICD-10-CM

## 2016-04-24 DIAGNOSIS — R200 Anesthesia of skin: Secondary | ICD-10-CM

## 2016-04-24 DIAGNOSIS — T451X5A Adverse effect of antineoplastic and immunosuppressive drugs, initial encounter: Principal | ICD-10-CM

## 2016-04-24 DIAGNOSIS — I252 Old myocardial infarction: Secondary | ICD-10-CM | POA: Diagnosis not present

## 2016-04-24 DIAGNOSIS — E785 Hyperlipidemia, unspecified: Secondary | ICD-10-CM

## 2016-04-24 DIAGNOSIS — Z7982 Long term (current) use of aspirin: Secondary | ICD-10-CM | POA: Diagnosis not present

## 2016-04-24 DIAGNOSIS — G629 Polyneuropathy, unspecified: Secondary | ICD-10-CM | POA: Diagnosis not present

## 2016-04-24 DIAGNOSIS — I10 Essential (primary) hypertension: Secondary | ICD-10-CM

## 2016-04-24 DIAGNOSIS — Z803 Family history of malignant neoplasm of breast: Secondary | ICD-10-CM | POA: Diagnosis not present

## 2016-04-24 DIAGNOSIS — Z923 Personal history of irradiation: Secondary | ICD-10-CM | POA: Diagnosis not present

## 2016-04-24 DIAGNOSIS — Z79899 Other long term (current) drug therapy: Secondary | ICD-10-CM | POA: Diagnosis not present

## 2016-04-24 LAB — CBC WITH DIFFERENTIAL/PLATELET
BASOS ABS: 0 10*3/uL (ref 0–0.1)
Basophils Relative: 1 %
EOS PCT: 4 %
Eosinophils Absolute: 0.2 10*3/uL (ref 0–0.7)
HEMATOCRIT: 39 % (ref 35.0–47.0)
HEMOGLOBIN: 13.5 g/dL (ref 12.0–16.0)
LYMPHS ABS: 1.7 10*3/uL (ref 1.0–3.6)
LYMPHS PCT: 39 %
MCH: 32 pg (ref 26.0–34.0)
MCHC: 34.6 g/dL (ref 32.0–36.0)
MCV: 92.5 fL (ref 80.0–100.0)
Monocytes Absolute: 0.3 10*3/uL (ref 0.2–0.9)
Monocytes Relative: 7 %
NEUTROS ABS: 2.1 10*3/uL (ref 1.4–6.5)
NEUTROS PCT: 49 %
PLATELETS: 227 10*3/uL (ref 150–440)
RBC: 4.22 MIL/uL (ref 3.80–5.20)
RDW: 13.4 % (ref 11.5–14.5)
WBC: 4.3 10*3/uL (ref 3.6–11.0)

## 2016-04-24 LAB — COMPREHENSIVE METABOLIC PANEL
ALK PHOS: 64 U/L (ref 38–126)
ALT: 54 U/L (ref 14–54)
AST: 46 U/L — AB (ref 15–41)
Albumin: 4 g/dL (ref 3.5–5.0)
Anion gap: 7 (ref 5–15)
BUN: 11 mg/dL (ref 6–20)
CALCIUM: 8.6 mg/dL — AB (ref 8.9–10.3)
CHLORIDE: 101 mmol/L (ref 101–111)
CO2: 27 mmol/L (ref 22–32)
CREATININE: 0.85 mg/dL (ref 0.44–1.00)
Glucose, Bld: 85 mg/dL (ref 65–99)
Potassium: 3.8 mmol/L (ref 3.5–5.1)
Sodium: 135 mmol/L (ref 135–145)
Total Bilirubin: 1.2 mg/dL (ref 0.3–1.2)
Total Protein: 7.3 g/dL (ref 6.5–8.1)

## 2016-04-24 MED ORDER — HEPARIN SOD (PORK) LOCK FLUSH 100 UNIT/ML IV SOLN
500.0000 [IU] | Freq: Once | INTRAVENOUS | Status: AC
  Administered 2016-04-24: 500 [IU] via INTRAVENOUS
  Filled 2016-04-24: qty 5

## 2016-04-24 MED ORDER — SODIUM CHLORIDE 0.9% FLUSH
10.0000 mL | Freq: Once | INTRAVENOUS | Status: AC
  Administered 2016-04-24: 10 mL via INTRAVENOUS
  Filled 2016-04-24: qty 10

## 2016-04-24 NOTE — Progress Notes (Signed)
[facility] OFFICE PROGRESS NOTE  Patient Care Team: [name], MD as PCP - General (Family Medicine) [name], MD (General Surgery) [name], MD (Inactive) as Attending Physician (Internal Medicine)  No matching staging information was found for the patient.   Oncology History   # LEFT BREAST IDC; STAGE II [cT2N1] ER >90%; PR- 50-90%; her 2 NEU- POS; s/p Neoadj chemo; AUG 2016- TCH+P s/p Lumpec & partial ALND- path CR; s/p RT;  adj Herceptin; HELD for Jan 19th 2017 [in DEC 2016-EF dropped from 63 to 51%; FEB 27th EF-42.9%]; JAN 2016 START Arimidex; May 2017- EF- 60%; May 24th 2017-Re-start Herceptin q 3W; July 28th STOP herceptin [finished 2m/m2]  # PN- G-2; may 2017- Start cymbalta 644md  # April 2016- Liver Bx- NEG     Breast cancer of upper-outer quadrant of left female breast ([place]  12/27/2014 Initial Diagnosis    Breast cancer of upper-outer quadrant of left female breast ([facility]      INTERVAL HISTORY:  A very pleasant 89ear old female patient with above history of stage II ER/PR positive HER-2/neu positive breast cancer currently on adjuvant/maintenance Herceptin is here for follow-up/Proceed with Herceptin. Multiple interruptions sec to Drop in EF; She recently started back on Herceptin; also here to review the MUGA scan.  Patient denies any shortness of breath. Denies any chest pain. Denies any swelling in the legs. No headaches vision changes or double vision.  She continues to complain of tingling and numbness of her hands and feet- however improved since starting on Cymbalta/ stable.  REVIEW OF SYSTEMS:  A complete 10 point review of system is done which is negative except mentioned above/history of present illness.   PAST MEDICAL HISTORY :  Past Medical History:  Diagnosis Date  . Breast cancer ([facility]   Left-radiation  . Cancer ([facility]   Stage II, ER positive, PR positive, HER-2/neu overexpressing of the left  breast.  . Chemotherapy-induced peripheral neuropathy ([facility]  . [place] ([facility]  . Hyperlipidemia   . Hypertension   . Neuromuscular disorder (HCC)    neuropathy  . Neuropathy (H[facility]    PAST SURGICAL HISTORY :   Past Surgical History:  Procedure Laterality Date  . BREAST BIOPSY Left 2016   Positive  . BREAST LUMPECTOMY WITH SENTINEL LYMPH NODE BIOPSY Left 05/23/2015   Procedure: LEFT BREAST WIDE EXCISION WITH AXILLARY DISSECTION, MASTOPLASTY ;  Surgeon: JeRobert BellowMD;  Location: [facility] ORS;  Service: General;  Laterality: Left;  . BREAST SURGERY Left 12/18/14   breast biopsy/INVASIVE DUCTAL CARCINOMA OF BREAST, NOTTINGHAM GRADE 2.  . Marland KitchenREAST SURGERY  05/23/2015.   Wide excision/mastoplasty, axillary dissection. No residual invasive cancer, positive for residual DCIS. 0/2 nodes identified on axillary dissection. (no SLN by technetium or methylene blue)  . CARDIAC CATHETERIZATION    . PORTACATH PLACEMENT Right 12-31-14   Dr ByBary Castilla  FAMILY HISTORY :   Family History  Problem Relation Age of Onset  . Breast cancer Maternal Aunt   . Breast cancer Cousin   . Brain cancer Maternal Uncle     SOCIAL HISTORY:   Social History  Substance Use Topics  . Smoking status: Current Every Day Smoker    Packs/day: 0.50    Years: 18.00    Types: Cigarettes  . Smokeless tobacco: Not on file  . Alcohol use No    ALLERGIES:  is allergic to no known allergies.  MEDICATIONS:  Current Outpatient Prescriptions  Medication Sig Dispense Refill  . anastrozole (ARIMIDEX) 1 MG tablet Take 1 tablet (1 mg total) by mouth daily. 90 tablet 3  . aspirin 81 MG tablet Take 81 mg by mouth daily.    . carvedilol (COREG) 6.25 MG tablet Take 1 tablet (6.25 mg total) by mouth 2 (two) times daily. 180 tablet 3  . DULoxetine (CYMBALTA) 60 MG capsule TAKE 1 CAPSULE (60 MG TOTAL) BY MOUTH DAILY. 30 capsule 0  . lisinopril (PRINIVIL,ZESTRIL) 10 MG tablet TAKE 1 TABLET DAILY 90 tablet 0  .  simvastatin (ZOCOR) 40 MG tablet TAKE 1 TABLET DAILY AT 6 P.M. 90 tablet 0   No current facility-administered medications for this visit.    Facility-Administered Medications Ordered in Other Visits  Medication Dose Route Frequency Provider Last Rate Last Dose  . 0.9 %  sodium chloride infusion   Intravenous Continuous [name], MD      . 0.9 %  sodium chloride infusion   Intravenous Continuous Lloyd Huger, MD 999 mL/hr at 04/24/15 1520    . heparin lock flush 100 unit/mL  500 Units Intravenous Once Dmitriy Berenzon, MD      . sodium chloride 0.9 % injection 10 mL  10 mL Intravenous PRN [name], MD   10 mL at 04/04/15 1440  . sodium chloride flush (NS) 0.9 % injection 10 mL  10 mL Intravenous PRN Dmitriy Berenzon, MD        PHYSICAL EXAMINATION: ECOG PERFORMANCE STATUS: 0 - Asymptomatic  BP (!) 151/93 (BP Location: Right Arm, Patient Position: Sitting)   Pulse 98   Temp 98 F (36.7 C)   Resp 18   Wt 165 lb 6 oz (75 kg)   BMI 26.69 kg/m   Filed Weights   04/24/16 1348  Weight: 165 lb 6 oz (75 kg)    GENERAL: Well-nourished well-developed; Alert, no distress and comfortable.  Alone. EYES: no pallor or icterus OROPHARYNX: no thrush or ulceration; good dentition  NECK: supple, no masses felt LYMPH:  no palpable lymphadenopathy in the cervical, axillary or inguinal regions LUNGS: clear to auscultation and  No wheeze or crackles HEART/CVS: regular rate & rhythm and no murmurs; No lower extremity edema ABDOMEN:abdomen soft, non-tender and normal bowel sounds Musculoskeletal:no cyanosis of digits and no clubbing  PSYCH: alert & oriented x 3 with fluent speech NEURO: no focal motor/sensory deficits   LABORATORY DATA:  I have reviewed the data as listed    Component Value Date/Time   NA 135 04/24/2016 1313   NA 135 01/08/2015 0850   K 3.8 04/24/2016 1313   K 3.7 01/08/2015 0850   CL 101 04/24/2016 1313   CL 101 01/08/2015 0850   CO2 27 04/24/2016 1313    CO2 26 01/08/2015 0850   GLUCOSE 85 04/24/2016 1313   GLUCOSE 161 (H) 01/08/2015 0850   BUN 11 04/24/2016 1313   BUN 17 01/08/2015 0850   CREATININE 0.85 04/24/2016 1313   CREATININE 0.82 01/22/2015 1559   CALCIUM 8.6 (L) 04/24/2016 1313   CALCIUM 9.3 01/08/2015 0850   PROT 7.3 04/24/2016 1313   PROT 6.8 01/22/2015 1559   ALBUMIN 4.0 04/24/2016 1313   ALBUMIN 3.9 01/22/2015 1559   AST 46 (H) 04/24/2016 1313   AST 34 01/22/2015 1559   ALT 54 04/24/2016 1313   ALT 42 01/22/2015 1559   ALKPHOS 64 04/24/2016 1313   ALKPHOS 157 (H) 01/22/2015 1559   BILITOT 1.2 04/24/2016 1313   BILITOT 0.2 (  L) 01/22/2015 1559   GFRNONAA >60 04/24/2016 1313   GFRNONAA >60 01/22/2015 1559   GFRAA >60 04/24/2016 1313   GFRAA >60 01/22/2015 1559    No results found for: SPEP, UPEP  Lab Results  Component Value Date   WBC 4.3 04/24/2016   NEUTROABS 2.1 04/24/2016   HGB 13.5 04/24/2016   HCT 39.0 04/24/2016   MCV 92.5 04/24/2016   PLT 227 04/24/2016      Chemistry      Component Value Date/Time   NA 135 04/24/2016 1313   NA 135 01/08/2015 0850   K 3.8 04/24/2016 1313   K 3.7 01/08/2015 0850   CL 101 04/24/2016 1313   CL 101 01/08/2015 0850   CO2 27 04/24/2016 1313   CO2 26 01/08/2015 0850   BUN 11 04/24/2016 1313   BUN 17 01/08/2015 0850   CREATININE 0.85 04/24/2016 1313   CREATININE 0.82 01/22/2015 1559      Component Value Date/Time   CALCIUM 8.6 (L) 04/24/2016 1313   CALCIUM 9.3 01/08/2015 0850   ALKPHOS 64 04/24/2016 1313   ALKPHOS 157 (H) 01/22/2015 1559   AST 46 (H) 04/24/2016 1313   AST 34 01/22/2015 1559   ALT 54 04/24/2016 1313   ALT 42 01/22/2015 1559   BILITOT 1.2 04/24/2016 1313   BILITOT 0.2 (L) 01/22/2015 1559            LABORATORY DATA:  I have reviewed the data as listed    Component Value Date/Time   NA 135 04/24/2016 1313   NA 135 01/08/2015 0850   K 3.8 04/24/2016 1313   K 3.7 01/08/2015 0850   CL 101 04/24/2016 1313   CL 101 01/08/2015 0850    CO2 27 04/24/2016 1313   CO2 26 01/08/2015 0850   GLUCOSE 85 04/24/2016 1313   GLUCOSE 161 (H) 01/08/2015 0850   BUN 11 04/24/2016 1313   BUN 17 01/08/2015 0850   CREATININE 0.85 04/24/2016 1313   CREATININE 0.82 01/22/2015 1559   CALCIUM 8.6 (L) 04/24/2016 1313   CALCIUM 9.3 01/08/2015 0850   PROT 7.3 04/24/2016 1313   PROT 6.8 01/22/2015 1559   ALBUMIN 4.0 04/24/2016 1313   ALBUMIN 3.9 01/22/2015 1559   AST 46 (H) 04/24/2016 1313   AST 34 01/22/2015 1559   ALT 54 04/24/2016 1313   ALT 42 01/22/2015 1559   ALKPHOS 64 04/24/2016 1313   ALKPHOS 157 (H) 01/22/2015 1559   BILITOT 1.2 04/24/2016 1313   BILITOT 0.2 (L) 01/22/2015 1559   GFRNONAA >60 04/24/2016 1313   GFRNONAA >60 01/22/2015 1559   GFRAA >60 04/24/2016 1313   GFRAA >60 01/22/2015 1559    No results found for: SPEP, UPEP  Lab Results  Component Value Date   WBC 4.3 04/24/2016   NEUTROABS 2.1 04/24/2016   HGB 13.5 04/24/2016   HCT 39.0 04/24/2016   MCV 92.5 04/24/2016   PLT 227 04/24/2016      Chemistry      Component Value Date/Time   NA 135 04/24/2016 1313   NA 135 01/08/2015 0850   K 3.8 04/24/2016 1313   K 3.7 01/08/2015 0850   CL 101 04/24/2016 1313   CL 101 01/08/2015 0850   CO2 27 04/24/2016 1313   CO2 26 01/08/2015 0850   BUN 11 04/24/2016 1313   BUN 17 01/08/2015 0850   CREATININE 0.85 04/24/2016 1313   CREATININE 0.82 01/22/2015 1559      Component Value Date/Time   CALCIUM 8.6 (  L) 04/24/2016 1313   CALCIUM 9.3 01/08/2015 0850   ALKPHOS 64 04/24/2016 1313   ALKPHOS 157 (H) 01/22/2015 1559   AST 46 (H) 04/24/2016 1313   AST 34 01/22/2015 1559   ALT 54 04/24/2016 1313   ALT 42 01/22/2015 1559   BILITOT 1.2 04/24/2016 1313   BILITOT 0.2 (L) 01/22/2015 1559       RADIOGRAPHIC STUDIES: I have personally reviewed the radiological images as listed and agreed with the findings in the report. No results found.   ASSESSMENT & PLAN:  Breast cancer of upper-outer quadrant of left  female breast ([place]) Stage II breast cancer-ER/PR positive HER-2/neu positive. Currently on adjuvant Herceptin. No clinical evidence of recurrence. Continue Anastrazole.   Secondary to Herceptin; asymptomatic. Most recent ejection fraction May 18th 2017-60% baseline. Repeat MUGA scan in the end of July 2017- down to 50%. Finished 84 of planned 116m/m2. STOP herceptin today.   # Bone health- recommend ca+ vit D BID.   # Grade 1- improvement noted on Cymbalta. Continue the same; would not recommend increasing the dose  # port flushes every 6-8 weeks  # MUGA scan in 3 months/ repeat in 3 months.  # Follow up MD/Labs in 3 months.    Orders Placed This Encounter  Procedures  . NM Cardiac [place] Rest    Standing Status:   Future    Standing Expiration Date:   04/24/2017    Order Specific Question:   Reason for Exam (SYMPTOM  OR DIAGNOSIS REQUIRED)    Answer:   low ejection fraction from herceptin    Order Specific Question:   Preferred imaging location?    Answer:   [facility]    Order Specific Question:   If indicated for the ordered procedure, I authorize the administration of a radiopharmaceutical per Radiology protocol    Answer:   Yes  . CBC with Differential    Standing Status:   Future    Standing Expiration Date:   04/24/2017  . Comprehensive metabolic panel    Standing Status:   Future    Standing Expiration Date:   04/24/2017   All questions were answered. The patient knows to call the clinic with any problems, questions or concerns.      GCammie Sickle MD 04/24/2016 5:29 PM

## 2016-04-24 NOTE — Assessment & Plan Note (Addendum)
Stage II breast cancer-ER/PR positive HER-2/neu positive. Currently on adjuvant Herceptin. No clinical evidence of recurrence. Continue Anastrazole.   Secondary to Herceptin; asymptomatic. Most recent ejection fraction May 18th 2017-60% baseline. Repeat MUGA scan in the end of July 2017- down to 50%. Finished 84 of planned 160m/m2. STOP herceptin today.   # Long discussion regarding the rationale of stopping/interruption of Herceptin therapy.    # Bone health- recommend ca+ vit D BID.   # Grade 1- improvement noted on Cymbalta. Continue the same; would not recommend increasing the dose  # port flushes every 6-8 weeks  # MUGA scan in 3 months/ repeat in 3 months.  # Follow up MD/Labs in 3 months.

## 2016-04-30 ENCOUNTER — Ambulatory Visit: Payer: BLUE CROSS/BLUE SHIELD | Admitting: Family Medicine

## 2016-05-05 ENCOUNTER — Encounter: Payer: Self-pay | Admitting: *Deleted

## 2016-05-07 ENCOUNTER — Ambulatory Visit (INDEPENDENT_AMBULATORY_CARE_PROVIDER_SITE_OTHER): Payer: BLUE CROSS/BLUE SHIELD | Admitting: Family Medicine

## 2016-05-07 ENCOUNTER — Encounter: Payer: Self-pay | Admitting: Family Medicine

## 2016-05-07 VITALS — BP 138/81 | HR 81 | Temp 98.9°F | Resp 16 | Ht 66.0 in | Wt 165.5 lb

## 2016-05-07 DIAGNOSIS — I10 Essential (primary) hypertension: Secondary | ICD-10-CM | POA: Diagnosis not present

## 2016-05-07 DIAGNOSIS — E785 Hyperlipidemia, unspecified: Secondary | ICD-10-CM | POA: Diagnosis not present

## 2016-05-07 MED ORDER — LISINOPRIL 10 MG PO TABS: 10.0000 mg | ORAL_TABLET | Freq: Every day | ORAL | 0 refills | Status: DC

## 2016-05-07 MED ORDER — SIMVASTATIN 40 MG PO TABS: 40.0000 mg | ORAL_TABLET | Freq: Every day | ORAL | 0 refills | Status: DC

## 2016-05-07 MED ORDER — CARVEDILOL 6.25 MG PO TABS
6.2500 mg | ORAL_TABLET | Freq: Two times a day (BID) | ORAL | 3 refills | Status: DC
Start: 2016-05-07 — End: 2016-05-07

## 2016-05-07 MED ORDER — CARVEDILOL 6.25 MG PO TABS: 6.2500 mg | ORAL_TABLET | Freq: Two times a day (BID) | ORAL | 0 refills | Status: DC

## 2016-05-07 NOTE — Progress Notes (Signed)
Name: Sarah Horne   MRN: [id number]    DOB: 1956-08-05   Date:05/07/2016       Progress Note  Subjective  Chief Complaint  Chief Complaint  Patient presents with  . Follow-up    3 mo    Hypertension  This is a chronic problem. The problem has been gradually worsening since onset. The problem is controlled. Pertinent negatives include no blurred vision, chest pain, headaches, palpitations or shortness of breath. Past treatments include ACE inhibitors and beta blockers. There are no compliance problems.  Hypertensive end-organ damage includes CAD/MI. There is no history of kidney disease or CVA.  Hyperlipidemia  This is a chronic problem. The problem is controlled. Recent lipid tests were reviewed and are normal. Pertinent negatives include no chest pain, leg pain, myalgias or shortness of breath. Current antihyperlipidemic treatment includes statins.    Past Medical History:  Diagnosis Date  . Breast cancer ([place]) 2016   Left-radiation  . Cancer ([place]) 2016   Stage II, ER positive, PR positive, HER-2/neu overexpressing of the left breast.  . Chemotherapy-induced peripheral neuropathy ([place])   . Heart attack ([place]) 1998  . Hyperlipidemia   . Hypertension   . Neuromuscular disorder (HCC)    neuropathy  . Neuropathy [facility])     Past Surgical History:  Procedure Laterality Date  . BREAST BIOPSY Left 2016   Positive  . BREAST LUMPECTOMY WITH SENTINEL LYMPH NODE BIOPSY Left 05/23/2015   Procedure: LEFT BREAST WIDE EXCISION WITH AXILLARY DISSECTION, MASTOPLASTY ;  Surgeon: [name], MD;  Location: [facility] ORS;  Service: General;  Laterality: Left;  . BREAST SURGERY Left 12/18/14   breast biopsy/INVASIVE DUCTAL CARCINOMA OF BREAST, NOTTINGHAM GRADE 2.  Marland Kitchen BREAST SURGERY  05/23/2015.   Wide excision/mastoplasty, axillary dissection. No residual invasive cancer, positive for residual DCIS. 0/2 nodes identified on axillary dissection. (no SLN by technetium or methylene blue)  . CARDIAC  CATHETERIZATION    . PORTACATH PLACEMENT Right 12-31-14   Dr Bary Castilla    Family History  Problem Relation Age of Onset  . Breast cancer Maternal Aunt   . Breast cancer Cousin   . Brain cancer Maternal Uncle     Social History   Social History  . Marital status: Divorced    Spouse name: N/A  . Number of children: N/A  . Years of education: N/A   Occupational History  . Not on file.   Social History Main Topics  . Smoking status: Current Every Day Smoker    Packs/day: 0.50    Years: 18.00    Types: Cigarettes  . Smokeless tobacco: Never Used  . Alcohol use No  . Drug use: No  . Sexual activity: Not on file   Other Topics Concern  . Not on file   Social History Narrative  . No narrative on file     Current Outpatient Prescriptions:  .  anastrozole (ARIMIDEX) 1 MG tablet, Take 1 tablet (1 mg total) by mouth daily., Disp: 90 tablet, Rfl: 3 .  aspirin 81 MG tablet, Take 81 mg by mouth daily., Disp: , Rfl:  .  carvedilol (COREG) 6.25 MG tablet, Take 1 tablet (6.25 mg total) by mouth 2 (two) times daily., Disp: 180 tablet, Rfl: 3 .  DULoxetine (CYMBALTA) 60 MG capsule, TAKE 1 CAPSULE (60 MG TOTAL) BY MOUTH DAILY., Disp: 30 capsule, Rfl: 0 .  lisinopril (PRINIVIL,ZESTRIL) 10 MG tablet, TAKE 1 TABLET DAILY, Disp: 90 tablet, Rfl: 0 .  simvastatin (ZOCOR) 40 MG  tablet, TAKE 1 TABLET DAILY AT 6 P.M., Disp: 90 tablet, Rfl: 0 No current facility-administered medications for this visit.   Facility-Administered Medications Ordered in Other Visits:  .  0.9 %  sodium chloride infusion, , Intravenous, Continuous, [name], MD .  0.9 %  sodium chloride infusion, , Intravenous, Continuous, Lloyd Huger, MD, Last Rate: 999 mL/hr at 04/24/15 1520 .  heparin lock flush 100 unit/mL, 500 Units, Intravenous, Once, Dmitriy Berenzon, MD .  sodium chloride 0.9 % injection 10 mL, 10 mL, Intravenous, PRN, [name], MD, 10 mL at 04/04/15 1440 .  sodium chloride flush (NS) 0.9 %  injection 10 mL, 10 mL, Intravenous, PRN, Roxana Hires, MD  Allergies  Allergen Reactions  . No Known Allergies      Review of Systems  Eyes: Negative for blurred vision.  Respiratory: Negative for shortness of breath.   Cardiovascular: Negative for chest pain and palpitations.  Musculoskeletal: Negative for myalgias.  Neurological: Negative for headaches.    Objective  Vitals:   05/07/16 1558  BP: 138/81  Pulse: 81  Resp: 16  Temp: 98.9 F (37.2 C)  TempSrc: Oral  SpO2: 96%  Weight: 165 lb 8 oz (75.1 kg)  Height: 5' 6"  (1.676 m)    Physical Exam  Constitutional: She is oriented to person, place, and time and well-developed, well-nourished, and in no distress.  Cardiovascular: Normal rate, regular rhythm and normal heart sounds.   No murmur heard. Pulmonary/Chest: Effort normal and breath sounds normal. She has no wheezes.  Abdominal: Soft. Bowel sounds are normal.  Neurological: She is alert and oriented to person, place, and time.  Psychiatric: Mood, memory, affect and judgment normal.  Nursing note and vitals reviewed.    Assessment & Plan  1. Essential hypertension Worsening, carvedilol has been increased to 6.25 mg twice daily by the specialist. Systolic blood pressure is elevated, recheck in 6 weeks - carvedilol (COREG) 6.25 MG tablet; Take 1 tablet (6.25 mg total) by mouth 2 (two) times daily.  Dispense: 180 tablet; Refill: 0 - lisinopril (PRINIVIL,ZESTRIL) 10 MG tablet; Take 1 tablet (10 mg total) by mouth daily.  Dispense: 90 tablet; Refill: 0  2. Hyperlipidemia  - simvastatin (ZOCOR) 40 MG tablet; Take 1 tablet (40 mg total) by mouth daily at 6 PM.  Dispense: 90 tablet; Refill: 0   Mehr Depaoli Asad A. [facility] 05/07/2016 4:44 PM

## 2016-05-14 ENCOUNTER — Encounter: Payer: Self-pay | Admitting: Radiation Oncology

## 2016-05-14 ENCOUNTER — Ambulatory Visit
Admission: RE | Admit: 2016-05-14 | Discharge: 2016-05-14 | Disposition: A | Discharge status: Home / Self Care | Payer: BLUE CROSS/BLUE SHIELD | Source: Ambulatory Visit | Attending: Radiation Oncology | Admitting: Radiation Oncology

## 2016-05-14 VITALS — BP 138/91 | HR 106 | Temp 96.7°F | Resp 20 | Wt 166.3 lb

## 2016-05-14 DIAGNOSIS — C50412 Malignant neoplasm of upper-outer quadrant of left female breast: Secondary | ICD-10-CM

## 2016-05-14 DIAGNOSIS — Z923 Personal history of irradiation: Secondary | ICD-10-CM | POA: Insufficient documentation

## 2016-05-14 DIAGNOSIS — Z79811 Long term (current) use of aromatase inhibitors: Secondary | ICD-10-CM | POA: Diagnosis not present

## 2016-05-14 DIAGNOSIS — Z9221 Personal history of antineoplastic chemotherapy: Secondary | ICD-10-CM | POA: Diagnosis not present

## 2016-05-14 DIAGNOSIS — Z170 Estrogen receptor positive status [ER+]: Secondary | ICD-10-CM | POA: Insufficient documentation

## 2016-05-14 DIAGNOSIS — M65312 Trigger thumb, left thumb: Secondary | ICD-10-CM | POA: Diagnosis not present

## 2016-05-14 NOTE — Progress Notes (Signed)
This 60y.o. female presents to the clinic today for follow-up or breast cancer stage II (T2 N1 M0) triple positive status post neoadjuvant chemotherapy Herceptin and whole breast and peripheral lymphatic radiation to her left breast. She is now 9 month out from completion of RT.  REFERRING PROVIDER: [name], MD  HPI: Patient is a 60 year old female now 58 month out of completing radiation therapy to her left breast and peripheral lymphatics status post wide local excision for initial size 4.3 cm lesion with a 2.7 cm left axillary lymph node which was positive for poorly differentiated carcinoma ER/PR positive HER-2/neu overexpressed. She underwent neoadjuvant chemotherapy followed by wide local excision and adjuvant RT. She completed 8 months of adjuvant herceptin but stopped due to cardiac toxicity. Continues on anastrazole.  She seen today in routine follow-up.  She continues to have moderate left breast tenderness. She denies cough or bone pain. She continues to have some numbness in the site of excision. She does not complain of any swelling of her left upper extremity. She has recently developed a "trigger" finger on the left thumb. It is not causing any limitations or pain.  COMPLICATIONS OF TREATMENT: none  FOLLOW UP COMPLIANCE: keeps appointments   PHYSICAL EXAM:  Vitals:   05/14/16 1425  BP: (!) 138/91  Pulse: (!) 106  Resp: 20  Temp: (!) 96.7 F (35.9 C)  ECOG PS: 0 Awake, alert, NAD. PERLA, EOMI, speech clear. Gait normal. Neck clear with adenopathy. Lungs clear. Heart regular. abd s/nt/nd. No back/bony pain on percussion. Breast were examined in 2 positions. She is s/p wide local excision on the left breast and has port in place over the right breast. The is minimal retraction superiorly on the left towards the scar. The right breast is without dominant masses or abnormalities. The left breast has a full and firm lumpectomy bed c/w scar. No lymphedema is noted.     RADIOLOGY: mammogram on right in May was normal.  MUGA on 04/22/16 shows LVEF 50%  ASSESSMENT: NED 9 months after completion of adjuvant RT  PLAN: will have her f/u in 6 months. Continue anastrazole. F/u with Dr. Rogue Bussing as planned. Mammogram of left breast in Oct - encouraged her to complete this to provide baseline s/p treatment on the left and assess for recurrence.  Thank you for allowing me to participate in the care of this patient.  Mallie Darting MD PhD  Cc: Keith Rake, MD, Cammie Sickle, MD

## 2016-05-20 ENCOUNTER — Other Ambulatory Visit: Payer: Self-pay | Admitting: Internal Medicine

## 2016-05-23 DIAGNOSIS — J019 Acute sinusitis, unspecified: Secondary | ICD-10-CM | POA: Diagnosis not present

## 2016-05-23 DIAGNOSIS — J208 Acute bronchitis due to other specified organisms: Secondary | ICD-10-CM | POA: Diagnosis not present

## 2016-05-23 DIAGNOSIS — B9689 Other specified bacterial agents as the cause of diseases classified elsewhere: Secondary | ICD-10-CM | POA: Diagnosis not present

## 2016-06-03 ENCOUNTER — Ambulatory Visit
Admission: RE | Admit: 2016-06-03 | Discharge: 2016-06-03 | Disposition: A | Discharge status: Home / Self Care | Payer: BLUE CROSS/BLUE SHIELD | Source: Ambulatory Visit | Attending: General Surgery | Admitting: General Surgery

## 2016-06-03 ENCOUNTER — Other Ambulatory Visit: Payer: BLUE CROSS/BLUE SHIELD

## 2016-06-03 ENCOUNTER — Ambulatory Visit: Payer: BLUE CROSS/BLUE SHIELD

## 2016-06-03 ENCOUNTER — Other Ambulatory Visit: Payer: Self-pay | Admitting: General Surgery

## 2016-06-03 DIAGNOSIS — R922 Inconclusive mammogram: Secondary | ICD-10-CM | POA: Diagnosis not present

## 2016-06-03 DIAGNOSIS — C50112 Malignant neoplasm of central portion of left female breast: Secondary | ICD-10-CM

## 2016-06-03 NOTE — Addendum Note (Signed)
Encounter addended by: Angus Seller, RT on: 06/03/2016  2:31 PM<BR>    Actions taken: Imaging Exam ended

## 2016-06-04 ENCOUNTER — Encounter: Payer: Self-pay | Admitting: *Deleted

## 2016-06-05 ENCOUNTER — Inpatient Hospital Stay: Payer: BLUE CROSS/BLUE SHIELD | Attending: Internal Medicine

## 2016-06-05 DIAGNOSIS — Z452 Encounter for adjustment and management of vascular access device: Secondary | ICD-10-CM | POA: Diagnosis not present

## 2016-06-05 DIAGNOSIS — Z95828 Presence of other vascular implants and grafts: Secondary | ICD-10-CM

## 2016-06-05 DIAGNOSIS — C50412 Malignant neoplasm of upper-outer quadrant of left female breast: Secondary | ICD-10-CM | POA: Diagnosis not present

## 2016-06-05 DIAGNOSIS — Z170 Estrogen receptor positive status [ER+]: Secondary | ICD-10-CM | POA: Diagnosis not present

## 2016-06-05 MED ORDER — HEPARIN SOD (PORK) LOCK FLUSH 100 UNIT/ML IV SOLN
500.0000 [IU] | Freq: Once | INTRAVENOUS | Status: AC
  Administered 2016-06-05: 500 [IU] via INTRAVENOUS
  Filled 2016-06-05: qty 5

## 2016-06-05 MED ORDER — SODIUM CHLORIDE 0.9% FLUSH
10.0000 mL | INTRAVENOUS | Status: DC | PRN
Start: 2016-06-05 — End: 2016-06-05
  Administered 2016-06-05: 10 mL via INTRAVENOUS
  Filled 2016-06-05: qty 10

## 2016-06-10 ENCOUNTER — Ambulatory Visit: Payer: BLUE CROSS/BLUE SHIELD | Admitting: General Surgery

## 2016-06-17 ENCOUNTER — Encounter: Payer: Self-pay | Admitting: General Surgery

## 2016-06-17 ENCOUNTER — Ambulatory Visit (INDEPENDENT_AMBULATORY_CARE_PROVIDER_SITE_OTHER): Payer: BLUE CROSS/BLUE SHIELD | Admitting: General Surgery

## 2016-06-17 VITALS — BP 126/74 | HR 88 | Resp 12 | Ht 66.0 in | Wt 166.0 lb

## 2016-06-17 DIAGNOSIS — C50812 Malignant neoplasm of overlapping sites of left female breast: Secondary | ICD-10-CM

## 2016-06-17 NOTE — Patient Instructions (Addendum)
The patient is aware to call back for any questions or concerns. Patient to have a bilateral diagnostic mammogram follow up in  11 months.

## 2016-06-17 NOTE — Progress Notes (Signed)
Patient ID: Sarah Horne, female   DOB: 1956-01-26, 60 y.o.   MRN: [id number]  Chief Complaint  Patient presents with  . Follow-up    mammogram    HPI Sarah Horne is a 60 y.o. female.  who presents for her follow up breast cancer and a breast evaluation. The most recent left mammogram was done on 06-03-16. She does states there is a little tenderness along the scar in the left breast. Patient does perform regular self breast checks and gets regular mammograms done.  No new breast issues.   HPI  Past Medical History:  Diagnosis Date  . Breast cancer ([place]) 2016   Left-radiation  . Cancer ([place]) 2016   Stage II, ER positive, PR positive, HER-2/neu overexpressing of the left breast.  . Chemotherapy-induced peripheral neuropathy ([place])   . Heart attack ([place]) 1998  . Hyperlipidemia   . Hypertension   . Neuromuscular disorder ([facility])    neuropathy  . Neuropathy [facility])     Past Surgical History:  Procedure Laterality Date  . BREAST BIOPSY Left 2016   Positive  . BREAST LUMPECTOMY WITH SENTINEL LYMPH NODE BIOPSY Left 05/23/2015   Procedure: LEFT BREAST WIDE EXCISION WITH AXILLARY DISSECTION, MASTOPLASTY ;  Surgeon: [name], MD;  Location: [facility] ORS;  Service: General;  Laterality: Left;  . BREAST SURGERY Left 12/18/14   breast biopsy/INVASIVE DUCTAL CARCINOMA OF BREAST, NOTTINGHAM GRADE 2.  Marland Kitchen BREAST SURGERY  05/23/2015.   Wide excision/mastoplasty, axillary dissection. No residual invasive cancer, positive for residual DCIS. 0/2 nodes identified on axillary dissection. (no SLN by technetium or methylene blue)  . CARDIAC CATHETERIZATION    . PORTACATH PLACEMENT Right 12-31-14   Dr Bary Castilla    Family History  Problem Relation Age of Onset  . Breast cancer Maternal Aunt   . Breast cancer Cousin   . Brain cancer Maternal Uncle     Social History Social History  Substance Use Topics  . Smoking status: Current Every Day Smoker    Packs/day: 0.50    Years: 18.00    Types:  Cigarettes  . Smokeless tobacco: Never Used  . Alcohol use No    Allergies  Allergen Reactions  . No Known Allergies     Current Outpatient Prescriptions  Medication Sig Dispense Refill  . anastrozole (ARIMIDEX) 1 MG tablet Take 1 tablet (1 mg total) by mouth daily. 90 tablet 3  . aspirin 81 MG tablet Take 81 mg by mouth daily.    . carvedilol (COREG) 6.25 MG tablet Take 1 tablet (6.25 mg total) by mouth 2 (two) times daily. 180 tablet 0  . DULoxetine (CYMBALTA) 60 MG capsule TAKE 1 CAPSULE (60 MG TOTAL) BY MOUTH DAILY. 30 capsule 0  . lisinopril (PRINIVIL,ZESTRIL) 10 MG tablet Take 1 tablet (10 mg total) by mouth daily. 90 tablet 0  . simvastatin (ZOCOR) 40 MG tablet Take 1 tablet (40 mg total) by mouth daily at 6 PM. 90 tablet 0   No current facility-administered medications for this visit.    Facility-Administered Medications Ordered in Other Visits  Medication Dose Route Frequency Provider Last Rate Last Dose  . 0.9 %  sodium chloride infusion   Intravenous Continuous [name], MD      . 0.9 %  sodium chloride infusion   Intravenous Continuous Lloyd Huger, MD 999 mL/hr at 04/24/15 1520    . heparin lock flush 100 unit/mL  500 Units Intravenous Once Roxana Hires, MD      .  sodium chloride 0.9 % injection 10 mL  10 mL Intravenous PRN [name], MD   10 mL at 04/04/15 1440  . sodium chloride flush (NS) 0.9 % injection 10 mL  10 mL Intravenous PRN Roxana Hires, MD        Review of Systems Review of Systems  Constitutional: Negative.   Respiratory: Negative.   Cardiovascular: Negative.     Blood pressure 126/74, pulse 88, resp. rate 12, height 5' 6" (1.676 m), weight 166 lb (75.3 kg).  Physical Exam Physical Exam  Constitutional: She is oriented to person, place, and time. She appears well-developed and well-nourished.  HENT:  Mouth/Throat: Oropharynx is clear and moist.  Eyes: Conjunctivae are normal. No scleral icterus.  Neck: Neck supple.   Cardiovascular: Normal rate, regular rhythm and normal heart sounds.   Pulmonary/Chest: Effort normal and breath sounds normal. Right breast exhibits no inverted nipple, no mass, no nipple discharge, no skin change and no tenderness. Left breast exhibits skin change. Left breast exhibits no inverted nipple, no mass, no nipple discharge and no tenderness.    Radiation skin changes left breast. Thickening along scar lateral 2/3 portion left breast.   Musculoskeletal:       Arms: Lymphadenopathy:    She has no cervical adenopathy.  Neurological: She is alert and oriented to person, place, and time.  Skin: Skin is warm and dry.  1 cm right paraspinal skin cyst.  Psychiatric: Her behavior is normal.     Upper extremity measurements were obtained at location 15 cm above as well as 10 and 20 cm below the olecranon process.   Measurement obtained 06/17/2016: Right: 35.5, 28.5, 21 cm. Left: 36, 27.5, 20 cm.  Measurements obtained 12/23/2015: Right side: 34.5, 28, 20. Left side: 35.5, 26, 20   Measurements obtained 08/19/2015:  On the right side:  33.5, 28 and 21 cm respectively.  On the left side: 34, 27.5 and 20 cm respectively  Data Reviewed Left breast mammogram dated 06/03/2016: Posttreatment changes. BI-RADS-2.   Assessment    Able breast exam. No evidence of local regional recurrence.  Minimal change and upper extremity edema, observation alone warranted.     Plan         Patient to have a bilateral diagnostic mammogram follow up in  11 months.This will push her back a little bit from the routine screening interval on the noninvolved breast but keep her on cycle for the treated breast.    This information has been scribed by Karie Fetch RN, BSN,BC.   [name] 06/18/2016, 7:23 AM

## 2016-06-18 DIAGNOSIS — C50812 Malignant neoplasm of overlapping sites of left female breast: Secondary | ICD-10-CM | POA: Insufficient documentation

## 2016-06-20 DIAGNOSIS — M546 Pain in thoracic spine: Secondary | ICD-10-CM | POA: Diagnosis not present

## 2016-06-26 ENCOUNTER — Other Ambulatory Visit: Payer: Self-pay | Admitting: Internal Medicine

## 2016-07-01 ENCOUNTER — Other Ambulatory Visit: Payer: Self-pay | Admitting: Family Medicine

## 2016-07-01 DIAGNOSIS — I10 Essential (primary) hypertension: Secondary | ICD-10-CM

## 2016-07-01 DIAGNOSIS — E785 Hyperlipidemia, unspecified: Secondary | ICD-10-CM

## 2016-07-17 ENCOUNTER — Inpatient Hospital Stay: Payer: BLUE CROSS/BLUE SHIELD | Attending: Internal Medicine

## 2016-07-17 DIAGNOSIS — Z79899 Other long term (current) drug therapy: Secondary | ICD-10-CM | POA: Insufficient documentation

## 2016-07-17 DIAGNOSIS — Z170 Estrogen receptor positive status [ER+]: Secondary | ICD-10-CM | POA: Diagnosis not present

## 2016-07-17 DIAGNOSIS — Z79811 Long term (current) use of aromatase inhibitors: Secondary | ICD-10-CM | POA: Insufficient documentation

## 2016-07-17 DIAGNOSIS — Z7982 Long term (current) use of aspirin: Secondary | ICD-10-CM | POA: Insufficient documentation

## 2016-07-17 DIAGNOSIS — R202 Paresthesia of skin: Secondary | ICD-10-CM | POA: Insufficient documentation

## 2016-07-17 DIAGNOSIS — G629 Polyneuropathy, unspecified: Secondary | ICD-10-CM | POA: Diagnosis not present

## 2016-07-17 DIAGNOSIS — F17210 Nicotine dependence, cigarettes, uncomplicated: Secondary | ICD-10-CM | POA: Diagnosis not present

## 2016-07-17 DIAGNOSIS — I252 Old myocardial infarction: Secondary | ICD-10-CM | POA: Diagnosis not present

## 2016-07-17 DIAGNOSIS — M8580 Other specified disorders of bone density and structure, unspecified site: Secondary | ICD-10-CM | POA: Insufficient documentation

## 2016-07-17 DIAGNOSIS — Z95828 Presence of other vascular implants and grafts: Secondary | ICD-10-CM

## 2016-07-17 DIAGNOSIS — I10 Essential (primary) hypertension: Secondary | ICD-10-CM | POA: Insufficient documentation

## 2016-07-17 DIAGNOSIS — R200 Anesthesia of skin: Secondary | ICD-10-CM | POA: Insufficient documentation

## 2016-07-17 DIAGNOSIS — C50412 Malignant neoplasm of upper-outer quadrant of left female breast: Secondary | ICD-10-CM | POA: Diagnosis not present

## 2016-07-17 DIAGNOSIS — E785 Hyperlipidemia, unspecified: Secondary | ICD-10-CM | POA: Insufficient documentation

## 2016-07-17 DIAGNOSIS — Z452 Encounter for adjustment and management of vascular access device: Secondary | ICD-10-CM | POA: Insufficient documentation

## 2016-07-17 MED ORDER — SODIUM CHLORIDE 0.9% FLUSH
10.0000 mL | INTRAVENOUS | Status: DC | PRN
Start: 2016-07-17 — End: 2016-07-17
  Administered 2016-07-17: 10 mL via INTRAVENOUS
  Filled 2016-07-17: qty 10

## 2016-07-17 MED ORDER — HEPARIN SOD (PORK) LOCK FLUSH 100 UNIT/ML IV SOLN
500.0000 [IU] | Freq: Once | INTRAVENOUS | Status: AC
  Administered 2016-07-17: 500 [IU] via INTRAVENOUS

## 2016-07-17 MED ORDER — HEPARIN SOD (PORK) LOCK FLUSH 100 UNIT/ML IV SOLN
INTRAVENOUS | Status: AC
  Filled 2016-07-17: qty 5

## 2016-07-24 ENCOUNTER — Encounter
Admission: RE | Admit: 2016-07-24 | Discharge: 2016-07-24 | Disposition: A | Discharge status: Home / Self Care | Payer: BLUE CROSS/BLUE SHIELD | Source: Ambulatory Visit | Attending: Internal Medicine | Admitting: Internal Medicine

## 2016-07-24 DIAGNOSIS — C50412 Malignant neoplasm of upper-outer quadrant of left female breast: Secondary | ICD-10-CM | POA: Diagnosis not present

## 2016-07-24 DIAGNOSIS — Z08 Encounter for follow-up examination after completed treatment for malignant neoplasm: Secondary | ICD-10-CM | POA: Diagnosis not present

## 2016-07-24 DIAGNOSIS — I429 Cardiomyopathy, unspecified: Secondary | ICD-10-CM | POA: Insufficient documentation

## 2016-07-24 DIAGNOSIS — Z853 Personal history of malignant neoplasm of breast: Secondary | ICD-10-CM | POA: Diagnosis not present

## 2016-07-24 MED ORDER — TECHNETIUM TC 99M-LABELED RED BLOOD CELLS IV KIT
20.0000 | PACK | Freq: Once | INTRAVENOUS | Status: AC | PRN
  Administered 2016-07-24: 21.43 via INTRAVENOUS

## 2016-07-27 ENCOUNTER — Inpatient Hospital Stay: Payer: BLUE CROSS/BLUE SHIELD

## 2016-07-27 ENCOUNTER — Inpatient Hospital Stay (HOSPITAL_BASED_OUTPATIENT_CLINIC_OR_DEPARTMENT_OTHER): Payer: BLUE CROSS/BLUE SHIELD | Admitting: Internal Medicine

## 2016-07-27 ENCOUNTER — Other Ambulatory Visit: Payer: Self-pay | Admitting: Internal Medicine

## 2016-07-27 VITALS — BP 131/82 | HR 93 | Temp 97.1°F | Ht 66.0 in | Wt 167.3 lb

## 2016-07-27 DIAGNOSIS — I429 Cardiomyopathy, unspecified: Secondary | ICD-10-CM

## 2016-07-27 DIAGNOSIS — E785 Hyperlipidemia, unspecified: Secondary | ICD-10-CM

## 2016-07-27 DIAGNOSIS — C50412 Malignant neoplasm of upper-outer quadrant of left female breast: Secondary | ICD-10-CM

## 2016-07-27 DIAGNOSIS — Z7982 Long term (current) use of aspirin: Secondary | ICD-10-CM

## 2016-07-27 DIAGNOSIS — Z79899 Other long term (current) drug therapy: Secondary | ICD-10-CM | POA: Diagnosis not present

## 2016-07-27 DIAGNOSIS — Z170 Estrogen receptor positive status [ER+]: Secondary | ICD-10-CM | POA: Diagnosis not present

## 2016-07-27 DIAGNOSIS — G629 Polyneuropathy, unspecified: Secondary | ICD-10-CM | POA: Diagnosis not present

## 2016-07-27 DIAGNOSIS — I10 Essential (primary) hypertension: Secondary | ICD-10-CM

## 2016-07-27 DIAGNOSIS — I252 Old myocardial infarction: Secondary | ICD-10-CM | POA: Diagnosis not present

## 2016-07-27 DIAGNOSIS — F17210 Nicotine dependence, cigarettes, uncomplicated: Secondary | ICD-10-CM

## 2016-07-27 DIAGNOSIS — R200 Anesthesia of skin: Secondary | ICD-10-CM | POA: Diagnosis not present

## 2016-07-27 DIAGNOSIS — Z452 Encounter for adjustment and management of vascular access device: Secondary | ICD-10-CM | POA: Diagnosis not present

## 2016-07-27 DIAGNOSIS — R202 Paresthesia of skin: Secondary | ICD-10-CM | POA: Diagnosis not present

## 2016-07-27 DIAGNOSIS — Z79811 Long term (current) use of aromatase inhibitors: Secondary | ICD-10-CM

## 2016-07-27 DIAGNOSIS — M8580 Other specified disorders of bone density and structure, unspecified site: Secondary | ICD-10-CM | POA: Diagnosis not present

## 2016-07-27 LAB — COMPREHENSIVE METABOLIC PANEL
ALT: 37 U/L (ref 14–54)
ANION GAP: 10 (ref 5–15)
AST: 39 U/L (ref 15–41)
Albumin: 4.2 g/dL (ref 3.5–5.0)
Alkaline Phosphatase: 86 U/L (ref 38–126)
BUN: 15 mg/dL (ref 6–20)
CHLORIDE: 100 mmol/L — AB (ref 101–111)
CO2: 26 mmol/L (ref 22–32)
Calcium: 8.6 mg/dL — ABNORMAL LOW (ref 8.9–10.3)
Creatinine, Ser: 0.79 mg/dL (ref 0.44–1.00)
Glucose, Bld: 78 mg/dL (ref 65–99)
POTASSIUM: 3.8 mmol/L (ref 3.5–5.1)
SODIUM: 136 mmol/L (ref 135–145)
Total Bilirubin: 0.6 mg/dL (ref 0.3–1.2)
Total Protein: 7.4 g/dL (ref 6.5–8.1)

## 2016-07-27 LAB — CBC WITH DIFFERENTIAL/PLATELET
Basophils Absolute: 0 10*3/uL (ref 0–0.1)
Basophils Relative: 1 %
EOS ABS: 0.3 10*3/uL (ref 0–0.7)
EOS PCT: 4 %
HCT: 41.1 % (ref 35.0–47.0)
Hemoglobin: 14 g/dL (ref 12.0–16.0)
LYMPHS ABS: 1.7 10*3/uL (ref 1.0–3.6)
Lymphocytes Relative: 31 %
MCH: 31.8 pg (ref 26.0–34.0)
MCHC: 34.1 g/dL (ref 32.0–36.0)
MCV: 93.2 fL (ref 80.0–100.0)
MONO ABS: 0.3 10*3/uL (ref 0.2–0.9)
MONOS PCT: 6 %
Neutro Abs: 3.3 10*3/uL (ref 1.4–6.5)
Neutrophils Relative %: 58 %
PLATELETS: 231 10*3/uL (ref 150–440)
RBC: 4.41 MIL/uL (ref 3.80–5.20)
RDW: 13.6 % (ref 11.5–14.5)
WBC: 5.6 10*3/uL (ref 3.6–11.0)

## 2016-07-27 MED ORDER — NERATINIB MALEATE 40 MG PO TABS: 160.0000 mg | ORAL_TABLET | Freq: Every day | ORAL | 3 refills | Status: DC

## 2016-07-27 NOTE — Progress Notes (Signed)
Sarah Horne OFFICE PROGRESS NOTE  Patient Care Team: Sarah Nova, MD as PCP - General (Family Medicine) [name], MD (General Surgery) [name], MD (Inactive) as Attending Physician (Internal Medicine)  No matching staging information was found for the patient.   Oncology History   # LEFT BREAST IDC; STAGE II [cT2N1] ER >90%; PR- 50-90%; her 2 NEU- POS; s/p Neoadj chemo; AUG 2016- TCH+P s/p Lumpec & partial ALND- path CR; s/p RT;  adj Herceptin; HELD for Jan 19th 2017 [in DEC 2016-EF dropped from 63 to 51%; FEB 27th EF-42.9%]; JAN 2016 START Arimidex; May 2017- EF- 60%; May 24th 2017-Re-start Herceptin q 3W; July 28th STOP herceptin [finished 21m/m2]  # NOV 2017- Start Neratinib.   # PN- G-2; may 2017- Start cymbalta 687md  # April 2016- Liver Bx- NEG;   # Drop in EF from Herceptin [recovered OCt 27th- EF 65%]     Carcinoma of upper-outer quadrant of left breast in female, estrogen receptor positive [place] [place]  12/27/2014 Initial Diagnosis    Breast cancer of upper-outer quadrant of left female breast ([facility]       INTERVAL HISTORY:  A very pleasant [age]ear old female patient with above history of stage II ER/PR positive HER-2/neu positive breast cancer currently Arimidex is here for follow-up.  She continues to complain of tingling and numbness of her hands and feet- however improved since starting on Cymbalta/ stable. She denies any new lumps or bumps. Denies any cough.  Patient denies any shortness of breath. Denies any chest pain. Denies any swelling in the legs. No headaches vision changes or double vision. Intermittent episodes of loose stools.   REVIEW OF SYSTEMS:  A complete 10 point review of system is done which is negative except mentioned above/history of present illness.   PAST MEDICAL HISTORY :  Past Medical History:  Diagnosis Date  . Breast cancer ([place]   Left-radiation  . Cancer ([place]   Stage II, ER positive, PR  positive, HER-2/neu overexpressing of the left breast.  . Chemotherapy-induced peripheral neuropathy ([facility]  . Heart attack 1998  . Hyperlipidemia   . Hypertension   . Neuromuscular disorder ([facility])    neuropathy  . Neuropathy ([facility]    PAST SURGICAL HISTORY :   Past Surgical History:  Procedure Laterality Date  . BREAST BIOPSY Left 2016   Positive  . BREAST LUMPECTOMY WITH SENTINEL LYMPH NODE BIOPSY Left 05/23/2015   Procedure: LEFT BREAST WIDE EXCISION WITH AXILLARY DISSECTION, MASTOPLASTY ;  Surgeon: JeRobert BellowMD;  Location: [facility] ORS;  Service: General;  Laterality: Left;  . BREAST SURGERY Left 12/18/14   breast biopsy/INVASIVE DUCTAL CARCINOMA OF BREAST, NOTTINGHAM GRADE 2.  . Marland KitchenREAST SURGERY  05/23/2015.   Wide excision/mastoplasty, axillary dissection. No residual invasive cancer, positive for residual DCIS. 0/2 nodes identified on axillary dissection. (no SLN by technetium or methylene blue)  . CARDIAC CATHETERIZATION    . PORTACATH PLACEMENT Right 12-31-14   Dr ByBary Castilla  FAMILY HISTORY :   Family History  Problem Relation Age of Onset  . Breast cancer Maternal Aunt   . Breast cancer Cousin   . Brain cancer Maternal Uncle     SOCIAL HISTORY:   Social History  Substance Use Topics  . Smoking status: Current Every Day Smoker    Packs/day: 0.50    Years: 18.00    Types: Cigarettes  . Smokeless tobacco: Never Used  . Alcohol use No  ALLERGIES:  is allergic to no known allergies.  MEDICATIONS:  Current Outpatient Prescriptions  Medication Sig Dispense Refill  . anastrozole (ARIMIDEX) 1 MG tablet Take 1 tablet (1 mg total) by mouth daily. 90 tablet 3  . aspirin 81 MG tablet Take 81 mg by mouth daily.    . carvedilol (COREG) 6.25 MG tablet Take 1 tablet (6.25 mg total) by mouth 2 (two) times daily. 180 tablet 0  . DULoxetine (CYMBALTA) 60 MG capsule TAKE 1 CAPSULE (60 MG TOTAL) BY MOUTH DAILY. 30 capsule 0  . lisinopril (PRINIVIL,ZESTRIL) 10 MG tablet TAKE 1  TABLET DAILY 90 tablet 0  . Neratinib Maleate 40 MG TABS Take 160 mg by mouth daily. NOT to start until okay by MD 120 tablet 3  . simvastatin (ZOCOR) 40 MG tablet TAKE 1 TABLET DAILY AT 6 P.M. 90 tablet 0   No current facility-administered medications for this visit.    Facility-Administered Medications Ordered in Other Visits  Medication Dose Route Frequency Provider Last Rate Last Dose  . 0.9 %  sodium chloride infusion   Intravenous Continuous [name], MD      . 0.9 %  sodium chloride infusion   Intravenous Continuous Lloyd Huger, MD 999 mL/hr at 04/24/15 1520    . heparin lock flush 100 unit/mL  500 Units Intravenous Once Dmitriy Berenzon, MD      . sodium chloride 0.9 % injection 10 mL  10 mL Intravenous PRN [name], MD   10 mL at 04/04/15 1440  . sodium chloride flush (NS) 0.9 % injection 10 mL  10 mL Intravenous PRN Dmitriy Berenzon, MD        PHYSICAL EXAMINATION: ECOG PERFORMANCE STATUS: 0 - Asymptomatic  BP 131/82 (BP Location: Right Arm, Patient Position: Sitting)   Pulse 93   Temp 97.1 F (36.2 C) (Tympanic)   Ht 5' 6"  (1.676 m)   Wt 167 lb 4.8 oz (75.9 kg)   BMI 27.00 kg/m   Filed Weights   07/27/16 1511  Weight: 167 lb 4.8 oz (75.9 kg)    GENERAL: Well-nourished well-developed; Alert, no distress and comfortable.  Alone. EYES: no pallor or icterus OROPHARYNX: no thrush or ulceration; good dentition  NECK: supple, no masses felt LYMPH:  no palpable lymphadenopathy in the cervical, axillary or inguinal regions LUNGS: clear to auscultation and  No wheeze or crackles HEART/CVS: regular rate & rhythm and no murmurs; No lower extremity edema ABDOMEN:abdomen soft, non-tender and normal bowel sounds Musculoskeletal:no cyanosis of digits and no clubbing  PSYCH: alert & oriented x 3 with fluent speech NEURO: no focal motor/sensory deficits   LABORATORY DATA:  I have reviewed the data as listed    Component Value Date/Time   NA 136 07/27/2016  1445   NA 135 01/08/2015 0850   K 3.8 07/27/2016 1445   K 3.7 01/08/2015 0850   CL 100 (L) 07/27/2016 1445   CL 101 01/08/2015 0850   CO2 26 07/27/2016 1445   CO2 26 01/08/2015 0850   GLUCOSE 78 07/27/2016 1445   GLUCOSE 161 (H) 01/08/2015 0850   BUN 15 07/27/2016 1445   BUN 17 01/08/2015 0850   CREATININE 0.79 07/27/2016 1445   CREATININE 0.82 01/22/2015 1559   CALCIUM 8.6 (L) 07/27/2016 1445   CALCIUM 9.3 01/08/2015 0850   PROT 7.4 07/27/2016 1445   PROT 6.8 01/22/2015 1559   ALBUMIN 4.2 07/27/2016 1445   ALBUMIN 3.9 01/22/2015 1559   AST 39 07/27/2016 1445   AST 34  01/22/2015 1559   ALT 37 07/27/2016 1445   ALT 42 01/22/2015 1559   ALKPHOS 86 07/27/2016 1445   ALKPHOS 157 (H) 01/22/2015 1559   BILITOT 0.6 07/27/2016 1445   BILITOT 0.2 (L) 01/22/2015 1559   GFRNONAA >60 07/27/2016 1445   GFRNONAA >60 01/22/2015 1559   GFRAA >60 07/27/2016 1445   GFRAA >60 01/22/2015 1559    No results found for: SPEP, UPEP  Lab Results  Component Value Date   WBC 5.6 07/27/2016   NEUTROABS 3.3 07/27/2016   HGB 14.0 07/27/2016   HCT 41.1 07/27/2016   MCV 93.2 07/27/2016   PLT 231 07/27/2016      Chemistry      Component Value Date/Time   NA 136 07/27/2016 1445   NA 135 01/08/2015 0850   K 3.8 07/27/2016 1445   K 3.7 01/08/2015 0850   CL 100 (L) 07/27/2016 1445   CL 101 01/08/2015 0850   CO2 26 07/27/2016 1445   CO2 26 01/08/2015 0850   BUN 15 07/27/2016 1445   BUN 17 01/08/2015 0850   CREATININE 0.79 07/27/2016 1445   CREATININE 0.82 01/22/2015 1559      Component Value Date/Time   CALCIUM 8.6 (L) 07/27/2016 1445   CALCIUM 9.3 01/08/2015 0850   ALKPHOS 86 07/27/2016 1445   ALKPHOS 157 (H) 01/22/2015 1559   AST 39 07/27/2016 1445   AST 34 01/22/2015 1559   ALT 37 07/27/2016 1445   ALT 42 01/22/2015 1559   BILITOT 0.6 07/27/2016 1445   BILITOT 0.2 (L) 01/22/2015 1559            LABORATORY DATA:  I have reviewed the data as listed    Component Value  Date/Time   NA 136 07/27/2016 1445   NA 135 01/08/2015 0850   K 3.8 07/27/2016 1445   K 3.7 01/08/2015 0850   CL 100 (L) 07/27/2016 1445   CL 101 01/08/2015 0850   CO2 26 07/27/2016 1445   CO2 26 01/08/2015 0850   GLUCOSE 78 07/27/2016 1445   GLUCOSE 161 (H) 01/08/2015 0850   BUN 15 07/27/2016 1445   BUN 17 01/08/2015 0850   CREATININE 0.79 07/27/2016 1445   CREATININE 0.82 01/22/2015 1559   CALCIUM 8.6 (L) 07/27/2016 1445   CALCIUM 9.3 01/08/2015 0850   PROT 7.4 07/27/2016 1445   PROT 6.8 01/22/2015 1559   ALBUMIN 4.2 07/27/2016 1445   ALBUMIN 3.9 01/22/2015 1559   AST 39 07/27/2016 1445   AST 34 01/22/2015 1559   ALT 37 07/27/2016 1445   ALT 42 01/22/2015 1559   ALKPHOS 86 07/27/2016 1445   ALKPHOS 157 (H) 01/22/2015 1559   BILITOT 0.6 07/27/2016 1445   BILITOT 0.2 (L) 01/22/2015 1559   GFRNONAA >60 07/27/2016 1445   GFRNONAA >60 01/22/2015 1559   GFRAA >60 07/27/2016 1445   GFRAA >60 01/22/2015 1559    No results found for: SPEP, UPEP  Lab Results  Component Value Date   WBC 5.6 07/27/2016   NEUTROABS 3.3 07/27/2016   HGB 14.0 07/27/2016   HCT 41.1 07/27/2016   MCV 93.2 07/27/2016   PLT 231 07/27/2016      Chemistry      Component Value Date/Time   NA 136 07/27/2016 1445   NA 135 01/08/2015 0850   K 3.8 07/27/2016 1445   K 3.7 01/08/2015 0850   CL 100 (L) 07/27/2016 1445   CL 101 01/08/2015 0850   CO2 26 07/27/2016 1445   CO2 26  01/08/2015 0850   BUN 15 07/27/2016 1445   BUN 17 01/08/2015 0850   CREATININE 0.79 07/27/2016 1445   CREATININE 0.82 01/22/2015 1559      Component Value Date/Time   CALCIUM 8.6 (L) 07/27/2016 1445   CALCIUM 9.3 01/08/2015 0850   ALKPHOS 86 07/27/2016 1445   ALKPHOS 157 (H) 01/22/2015 1559   AST 39 07/27/2016 1445   AST 34 01/22/2015 1559   ALT 37 07/27/2016 1445   ALT 42 01/22/2015 1559   BILITOT 0.6 07/27/2016 1445   BILITOT 0.2 (L) 01/22/2015 1559     IMPRESSION: Normal left ventricular wall motion with  estimated ejection fraction of 65%.   Electronically Signed   By: Marijo Sanes M.D.   On: 07/27/2016 09:04  RADIOGRAPHIC STUDIES: I have personally reviewed the radiological images as listed and agreed with the findings in the report. No results found.   ASSESSMENT & PLAN:  Carcinoma of upper-outer quadrant of left breast in female, estrogen receptor positive ([place]) Stage II breast cancer-ER/PR positive HER-2/neu positive. Currently on adjuvant Herceptin. No clinical evidence of recurrence. Continue Anastrazole. Finished 84 of planned 163m/m2. STOP herceptin sec to Low EF.   # Discussed re: use of adjuvant neratinib. Absolute benefit- of ~2%. But more benefit in ER/PR positive pts. Discussed re: significant AEs with diarrhea. Pt will need diarrhea prophylaxis. Will start pt on 4 pills a day. Pt will NOT start until instructed.   # BMD- Jan 2017- Osteopenia- takes ca+ vit D BID.   # Grade 1- improvement noted on Cymbalta. Continue the same; would not recommend increasing the dose  # port flushes every 6-8 weeks; last 2 weeks ago.   # follow up in 2 weeks/ no labs.    No orders of the defined types were placed in this encounter.  All questions were answered. The patient knows to call the clinic with any problems, questions or concerns.      GCammie Sickle MD 07/29/2016 8:07 AM

## 2016-07-27 NOTE — Progress Notes (Signed)
Patient here for follow up. Patient has been experiencing diarrhea about 15 minutes after eating.

## 2016-07-27 NOTE — Assessment & Plan Note (Addendum)
Stage II breast cancer-ER/PR positive HER-2/neu positive. Currently on adjuvant Herceptin. No clinical evidence of recurrence. Continue Anastrazole. Finished 84 of planned 141m/m2. STOP herceptin sec to Low EF.   # Discussed re: use of adjuvant neratinib. Absolute benefit- of ~2%. But more benefit in ER/PR positive pts. Discussed re: significant AEs with diarrhea. Pt will need diarrhea prophylaxis. Will start pt on 4 pills a day. Pt will NOT start until instructed.   # BMD- Jan 2017- Osteopenia- takes ca+ vit D BID.   # Grade 1- improvement noted on Cymbalta. Continue the same; would not recommend increasing the dose  # port flushes every 6-8 weeks; last 2 weeks ago.   # follow up in 2 weeks/ no labs.

## 2016-08-03 ENCOUNTER — Telehealth: Payer: Self-pay | Admitting: *Deleted

## 2016-08-03 NOTE — Telephone Encounter (Signed)
PA has been submitted to [facility]. Awaiting response.

## 2016-08-03 NOTE — Telephone Encounter (Signed)
Requesting Verification of Rx for Sarah Horne for Nerlynx and to let us know it needs PA, Please call  [phone] for PA Then let [facility] know when PA approved [phone]

## 2016-08-04 ENCOUNTER — Other Ambulatory Visit: Payer: Self-pay | Admitting: *Deleted

## 2016-08-10 ENCOUNTER — Inpatient Hospital Stay: Payer: BLUE CROSS/BLUE SHIELD | Attending: Internal Medicine | Admitting: Internal Medicine

## 2016-08-10 ENCOUNTER — Ambulatory Visit: Payer: BLUE CROSS/BLUE SHIELD | Admitting: Family Medicine

## 2016-08-10 VITALS — BP 113/77 | HR 92 | Temp 97.5°F | Resp 18 | Wt 166.6 lb

## 2016-08-10 DIAGNOSIS — Z923 Personal history of irradiation: Secondary | ICD-10-CM

## 2016-08-10 DIAGNOSIS — Z170 Estrogen receptor positive status [ER+]: Secondary | ICD-10-CM | POA: Diagnosis not present

## 2016-08-10 DIAGNOSIS — Z7982 Long term (current) use of aspirin: Secondary | ICD-10-CM | POA: Diagnosis not present

## 2016-08-10 DIAGNOSIS — Z803 Family history of malignant neoplasm of breast: Secondary | ICD-10-CM | POA: Diagnosis not present

## 2016-08-10 DIAGNOSIS — F17210 Nicotine dependence, cigarettes, uncomplicated: Secondary | ICD-10-CM | POA: Diagnosis not present

## 2016-08-10 DIAGNOSIS — E785 Hyperlipidemia, unspecified: Secondary | ICD-10-CM | POA: Insufficient documentation

## 2016-08-10 DIAGNOSIS — Z808 Family history of malignant neoplasm of other organs or systems: Secondary | ICD-10-CM | POA: Diagnosis not present

## 2016-08-10 DIAGNOSIS — I10 Essential (primary) hypertension: Secondary | ICD-10-CM

## 2016-08-10 DIAGNOSIS — Z79811 Long term (current) use of aromatase inhibitors: Secondary | ICD-10-CM

## 2016-08-10 DIAGNOSIS — M8580 Other specified disorders of bone density and structure, unspecified site: Secondary | ICD-10-CM | POA: Diagnosis not present

## 2016-08-10 DIAGNOSIS — C50412 Malignant neoplasm of upper-outer quadrant of left female breast: Secondary | ICD-10-CM

## 2016-08-10 DIAGNOSIS — Z79899 Other long term (current) drug therapy: Secondary | ICD-10-CM

## 2016-08-10 DIAGNOSIS — G709 Myoneural disorder, unspecified: Secondary | ICD-10-CM

## 2016-08-10 DIAGNOSIS — Z9221 Personal history of antineoplastic chemotherapy: Secondary | ICD-10-CM | POA: Diagnosis not present

## 2016-08-10 DIAGNOSIS — I252 Old myocardial infarction: Secondary | ICD-10-CM | POA: Insufficient documentation

## 2016-08-10 DIAGNOSIS — G629 Polyneuropathy, unspecified: Secondary | ICD-10-CM | POA: Insufficient documentation

## 2016-08-10 NOTE — Assessment & Plan Note (Addendum)
Stage II breast cancer-ER/PR positive HER-2/neu positive. Currently on adjuvant Herceptin. No clinical evidence of recurrence. Continue Anastrazole.   # Discussed re: use of adjuvant neratinib. Absolute benefit- of ~2%. But more benefit in ER/PR positive pts. Discussed re: significant AEs with diarrhea.  # Discussed at length re: side effects/ diarrhea prophylaxis with immodium. Will start pt on 4 pills a day.  # Grade 1- improvement noted on Cymbalta.not any worse or better.  # port flushes every 6-8 weeks; again in dec 2017. Discussed the only reason for needing the port is needed to use it for recurrent malignancy. For now we'll keep it.   # Patient follow-up with me in 4 weeks with labs.pt will cal [place] when she gets the medication; and also if she has not gotten it by 1 week.

## 2016-08-10 NOTE — Progress Notes (Signed)
[place] OFFICE PROGRESS NOTE  Patient Care Team: [name], MD as PCP - General (Family Medicine) [name], MD (General Surgery) [name], MD (Inactive) as Attending Physician (Internal Medicine)  No matching staging information was found for the patient.   Oncology History   # LEFT BREAST IDC; STAGE II [cT2N1] ER >90%; PR- 50-90%; her 2 NEU- POS; s/p Neoadj chemo; AUG 2016- TCH+P s/p Lumpec & partial ALND- path CR; s/p RT [finished Nov 2016];  adj Herceptin; HELD for Jan 19th 2017 [in DEC 2016-EF dropped from 63 to 51%; FEB 27th EF-42.9%]; JAN 2016 START Arimidex; May 2017- EF- 60%; May 24th 2017-Re-start Herceptin q 3W; July 28th STOP herceptin [finished 72m/m2]  # NOV 2017- Start Neratinib.   # PN- G-2; may 2017- Start cymbalta 639md  # April 2016- Liver Bx- NEG;   # Drop in EF from Herceptin [recovered OCt 27th- EF 65%]  # BMD- jan 2017- osteopenia     Carcinoma of upper-outer quadrant of left breast in female, estrogen receptor positive ([facility]  12/27/2014 Initial Diagnosis    Breast cancer of upper-outer quadrant of left female breast ([facility]       INTERVAL HISTORY:  A very pleasant [age]ear old female patient with above history of stage II ER/PR positive HER-2/neu positive breast cancer currently Arimidex is here for follow-up.  Patient has not yet received her medications; tingling and numbness is stable. She denies any new lumps or bumps. Denies any cough.  Patient denies any shortness of breath. Denies any chest pain. Denies any swelling in the legs. No headaches vision changes or double vision.    REVIEW OF SYSTEMS:  A complete 10 point review of system is done which is negative except mentioned above/history of present illness.   PAST MEDICAL HISTORY :  Past Medical History:  Diagnosis Date  . Breast cancer ([facility]   Left-radiation  . Cancer ([facility]   Stage II, ER positive, PR positive, HER-2/neu overexpressing  of the left breast.  . Chemotherapy[place]induced peripheral neuropathy ([facility]  . Heart attack 1998  . Hyperlipidemia   . Hypertension   . Neuromuscular disorder (HCC)    neuropathy  . [place]uropathy ([facility]    PAST SURGICAL HISTORY :   Past Surgical History:  Procedure Laterality Date  . BREAST BIOPSY Left 2016   Positive  . BREAST LUMPECTOMY WITH SENTINEL LYMPH NODE BIOPSY Left 05/23/2015   Procedure: LEFT BREAST WIDE EXCISION WITH AXILLARY DISSECTION, MASTOPLASTY ;  Surgeon: JeRobert BellowMD;  Location: [facility] ORS;  Service: General;  Laterality: Left;  . BREAST SURGERY Left 12/18/14   breast biopsy/INVASIVE DUCTAL CARCINOMA OF BREAST, NOTTINGHAM GRADE 2.  . Marland KitchenREAST SURGERY  05/23/2015.   Wide excision/mastoplasty, axillary dissection. No residual invasive cancer, positive for residual DCIS. 0/2 nodes identified on axillary dissection. (no SLN by technetium or methylene blue)  . CARDIAC CATHETERIZATION    . PORTACATH PLACEMENT Right 12-31-14   Dr ByBary Castilla  FAMILY HISTORY :   Family History  Problem Relation Age of Onset  . Breast cancer Maternal Aunt   . Breast cancer Cousin   . Brain cancer Maternal Uncle     SOCIAL HISTORY:   Social History  Substance Use Topics  . Smoking status: Current Every Day Smoker    Packs/day: 0.50    Years: 18.00    Types: Cigarettes  . Smokeless tobacco: Never Used  . Alcohol use No  ALLERGIES:  is allergic to no known allergies.  MEDICATIONS:  Current Outpatient Prescriptions  Medication Sig Dispense Refill  . anastrozole (ARIMIDEX) 1 MG tablet Take 1 tablet (1 mg total) by mouth daily. 90 tablet 3  . aspirin 81 MG tablet Take 81 mg by mouth daily.    . carvedilol (COREG) 6.25 MG tablet Take 1 tablet (6.25 mg total) by mouth 2 (two) times daily. 180 tablet 0  . DULoxetine (CYMBALTA) 60 MG capsule TAKE 1 CAPSULE (60 MG TOTAL) BY MOUTH DAILY. 30 capsule 0  . lisinopril (PRINIVIL,ZESTRIL) 10 MG tablet TAKE 1 TABLET DAILY 90 tablet 0  .  simvastatin (ZOCOR) 40 MG tablet TAKE 1 TABLET DAILY AT 6 P.M. 90 tablet 0  . Neratinib Maleate 40 MG TABS Take 160 mg by mouth daily. NOT to start until okay by MD (Patient not taking: Reported on 08/10/2016) 120 tablet 3   No current facility-administered medications for this visit.    Facility-Administered Medications Ordered in Other Visits  Medication Dose Route Frequency Provider Last Rate Last Dose  . 0.9 %  sodium chloride infusion   Intravenous Continuous [name], MD      . 0.9 %  sodium chloride infusion   Intravenous Continuous Lloyd Huger, MD 999 mL/hr at 04/24/15 1520    . heparin lock flush 100 unit/mL  500 Units Intravenous Once Dmitriy Berenzon, MD      . sodium chloride 0.9 % injection 10 mL  10 mL Intravenous PRN [name], MD   10 mL at 04/04/15 1440  . sodium chloride flush (NS) 0.9 % injection 10 mL  10 mL Intravenous PRN Dmitriy Berenzon, MD        PHYSICAL EXAMINATION: ECOG PERFORMANCE STATUS: 0 - Asymptomatic  BP 113/77 (BP Location: Right Arm, Patient Position: Sitting)   Pulse 92   Temp 97.5 F (36.4 C)   Resp 18   Wt 166 lb 9.6 oz (75.6 kg)   BMI 26.89 kg/m   Filed Weights   08/10/16 1511  Weight: 166 lb 9.6 oz (75.6 kg)    GENERAL: Well-nourished well-developed; Alert, no distress and comfortable.  Alone. EYES: no pallor or icterus OROPHARYNX: no thrush or ulceration; good dentition  NECK: supple, no masses felt LYMPH:  no palpable lymphadenopathy in the cervical, axillary or inguinal regions LUNGS: clear to auscultation and  No wheeze or crackles HEART/CVS: regular rate & rhythm and no murmurs; No lower extremity edema ABDOMEN:abdomen soft, non-tender and normal bowel sounds Musculoskeletal:no cyanosis of digits and no clubbing  PSYCH: alert & oriented x 3 with fluent speech NEURO: no focal motor/sensory deficits   LABORATORY DATA:  I have reviewed the data as listed    Component Value Date/Time   NA 136 07/27/2016 1445    NA 135 01/08/2015 0850   K 3.8 07/27/2016 1445   K 3.7 01/08/2015 0850   CL 100 (L) 07/27/2016 1445   CL 101 01/08/2015 0850   CO2 26 07/27/2016 1445   CO2 26 01/08/2015 0850   GLUCOSE 78 07/27/2016 1445   GLUCOSE 161 (H) 01/08/2015 0850   BUN 15 07/27/2016 1445   BUN 17 01/08/2015 0850   CREATININE 0.79 07/27/2016 1445   CREATININE 0.82 01/22/2015 1559   CALCIUM 8.6 (L) 07/27/2016 1445   CALCIUM 9.3 01/08/2015 0850   PROT 7.4 07/27/2016 1445   PROT 6.8 01/22/2015 1559   ALBUMIN 4.2 07/27/2016 1445   ALBUMIN 3.9 01/22/2015 1559   AST 39 07/27/2016 1445  AST 34 01/22/2015 1559   ALT 37 07/27/2016 1445   ALT 42 01/22/2015 1559   ALKPHOS 86 07/27/2016 1445   ALKPHOS 157 (H) 01/22/2015 1559   BILITOT 0.6 07/27/2016 1445   BILITOT 0.2 (L) 01/22/2015 1559   GFRNONAA >60 07/27/2016 1445   GFRNONAA >60 01/22/2015 1559   GFRAA >60 07/27/2016 1445   GFRAA >60 01/22/2015 1559    No results found for: SPEP, UPEP  Lab Results  Component Value Date   WBC 5.6 07/27/2016   NEUTROABS 3.3 07/27/2016   HGB 14.0 07/27/2016   HCT 41.1 07/27/2016   MCV 93.2 07/27/2016   PLT 231 07/27/2016      Chemistry      Component Value Date/Time   NA 136 07/27/2016 1445   NA 135 01/08/2015 0850   K 3.8 07/27/2016 1445   K 3.7 01/08/2015 0850   CL 100 (L) 07/27/2016 1445   CL 101 01/08/2015 0850   CO2 26 07/27/2016 1445   CO2 26 01/08/2015 0850   BUN 15 07/27/2016 1445   BUN 17 01/08/2015 0850   CREATININE 0.79 07/27/2016 1445   CREATININE 0.82 01/22/2015 1559      Component Value Date/Time   CALCIUM 8.6 (L) 07/27/2016 1445   CALCIUM 9.3 01/08/2015 0850   ALKPHOS 86 07/27/2016 1445   ALKPHOS 157 (H) 01/22/2015 1559   AST 39 07/27/2016 1445   AST 34 01/22/2015 1559   ALT 37 07/27/2016 1445   ALT 42 01/22/2015 1559   BILITOT 0.6 07/27/2016 1445   BILITOT 0.2 (L) 01/22/2015 1559            LABORATORY DATA:  I have reviewed the data as listed    Component Value Date/Time    NA 136 07/27/2016 1445   NA 135 01/08/2015 0850   K 3.8 07/27/2016 1445   K 3.7 01/08/2015 0850   CL 100 (L) 07/27/2016 1445   CL 101 01/08/2015 0850   CO2 26 07/27/2016 1445   CO2 26 01/08/2015 0850   GLUCOSE 78 07/27/2016 1445   GLUCOSE 161 (H) 01/08/2015 0850   BUN 15 07/27/2016 1445   BUN 17 01/08/2015 0850   CREATININE 0.79 07/27/2016 1445   CREATININE 0.82 01/22/2015 1559   CALCIUM 8.6 (L) 07/27/2016 1445   CALCIUM 9.3 01/08/2015 0850   PROT 7.4 07/27/2016 1445   PROT 6.8 01/22/2015 1559   ALBUMIN 4.2 07/27/2016 1445   ALBUMIN 3.9 01/22/2015 1559   AST 39 07/27/2016 1445   AST 34 01/22/2015 1559   ALT 37 07/27/2016 1445   ALT 42 01/22/2015 1559   ALKPHOS 86 07/27/2016 1445   ALKPHOS 157 (H) 01/22/2015 1559   BILITOT 0.6 07/27/2016 1445   BILITOT 0.2 (L) 01/22/2015 1559   GFRNONAA >60 07/27/2016 1445   GFRNONAA >60 01/22/2015 1559   GFRAA >60 07/27/2016 1445   GFRAA >60 01/22/2015 1559    No results found for: SPEP, UPEP  Lab Results  Component Value Date   WBC 5.6 07/27/2016   NEUTROABS 3.3 07/27/2016   HGB 14.0 07/27/2016   HCT 41.1 07/27/2016   MCV 93.2 07/27/2016   PLT 231 07/27/2016      Chemistry      Component Value Date/Time   NA 136 07/27/2016 1445   NA 135 01/08/2015 0850   K 3.8 07/27/2016 1445   K 3.7 01/08/2015 0850   CL 100 (L) 07/27/2016 1445   CL 101 01/08/2015 0850   CO2 26 07/27/2016 1445  CO2 26 01/08/2015 0850   BUN 15 07/27/2016 1445   BUN 17 01/08/2015 0850   CREATININE 0.79 07/27/2016 1445   CREATININE 0.82 01/22/2015 1559      Component Value Date/Time   CALCIUM 8.6 (L) 07/27/2016 1445   CALCIUM 9.3 01/08/2015 0850   ALKPHOS 86 07/27/2016 1445   ALKPHOS 157 (H) 01/22/2015 1559   AST 39 07/27/2016 1445   AST 34 01/22/2015 1559   ALT 37 07/27/2016 1445   ALT 42 01/22/2015 1559   BILITOT 0.6 07/27/2016 1445   BILITOT 0.2 (L) 01/22/2015 1559     IMPRESSION: Normal left ventricular wall motion with estimated  ejection fraction of 65%.   Electronically Signed   By: Marijo Sanes M.D.   On: 07/27/2016 09:04  RADIOGRAPHIC STUDIES: I have personally reviewed the radiological images as listed and agreed with the findings in the report. No results found.   ASSESSMENT & PLAN:  Carcinoma of upper-outer quadrant of left breast in female, estrogen receptor positive ([place]) Stage II breast cancer-ER/PR positive HER-2/neu positive. Currently on adjuvant Herceptin. No clinical evidence of recurrence. Continue Anastrazole.   # Discussed re: use of adjuvant neratinib. Absolute benefit- of ~2%. But more benefit in ER/PR positive pts. Discussed re: significant AEs with diarrhea.  # Discussed at length re: side effects/ diarrhea prophylaxis with immodium. Will start pt on 4 pills a day.  # Grade 1- improvement noted on Cymbalta.not any worse or better.  # port flushes every 6-8 weeks; again in dec 2017. Discussed the only reason for needing the port is needed to use it for recurrent malignancy. For now we'll keep it.   # Patient follow-up with me in 4 weeks with labs.pt will cal [place] when she gets the medication; and also if she has not gotten it by 1 week.    Orders Placed This Encounter  Procedures  . CBC with Differential    Standing Status:   Future    Standing Expiration Date:   08/10/2017  . Comprehensive metabolic panel    Standing Status:   Future    Standing Expiration Date:   08/10/2017   All questions were answered. The patient knows to call the clinic with any problems, questions or concerns.      Cammie Sickle, MD 08/10/2016 3:45 PM

## 2016-08-10 NOTE — Progress Notes (Signed)
Patient is here for a follow up,

## 2016-08-24 ENCOUNTER — Telehealth: Payer: Self-pay | Admitting: *Deleted

## 2016-08-24 ENCOUNTER — Other Ambulatory Visit: Payer: Self-pay | Admitting: Internal Medicine

## 2016-08-24 NOTE — Telephone Encounter (Signed)
I contacted Mrs. Harder to determine if her shipment of of neratinib has arrived and the status of her drug. I explained that her neratinib was prior approved.  I was transferred to [facility]. Sheene states that a prior Josem Kaufmann must be performed first.  I explained that this prior Josem Kaufmann was already approved. She asked that I fax the prior auth to her [phone].  This was faxed. She will have the team review this information and then subsequently reach out to the patient.  I personally asked her to contact me as soon as the patient's shipment is arranged.  I contacted the patient back per pt request. Left msg and informed pt that [facility]. Information was provided to the pharmacy.

## 2016-08-25 ENCOUNTER — Ambulatory Visit (INDEPENDENT_AMBULATORY_CARE_PROVIDER_SITE_OTHER): Payer: BLUE CROSS/BLUE SHIELD | Admitting: Family Medicine

## 2016-08-25 ENCOUNTER — Encounter: Payer: Self-pay | Admitting: Family Medicine

## 2016-08-25 DIAGNOSIS — J069 Acute upper respiratory infection, unspecified: Secondary | ICD-10-CM | POA: Diagnosis not present

## 2016-08-25 MED ORDER — AZITHROMYCIN 250 MG PO TABS: ORAL_TABLET | ORAL | 0 refills | Status: DC

## 2016-08-25 MED ORDER — GUAIFENESIN-CODEINE 100-10 MG/5ML PO SYRP: 10.0000 mL | ORAL_SOLUTION | Freq: Four times a day (QID) | ORAL | 0 refills | Status: DC | PRN

## 2016-08-25 NOTE — Progress Notes (Signed)
Name: Sarah Horne   MRN: [id number]    DOB: 05-12-1956   Date:08/25/2016       Progress Note  Subjective  Chief Complaint  Chief Complaint  Patient presents with  . Facial Pain    cough, congestion, scratcy throat for 2 days    Sinusitis  This is a new problem. The current episode started in the past 7 days. There has been no fever. Associated symptoms include congestion, coughing and a sore throat. Pertinent negatives include no chills or sinus pressure. Past treatments include nothing.     Past Medical History:  Diagnosis Date  . Breast cancer ([place]) 2016   Left-radiation  . Cancer ([place]) 2016   Stage II, ER positive, PR positive, HER-2/neu overexpressing of the left breast.  . Chemotherapy-induced peripheral neuropathy ([place])   . Heart attack 1998  . Hyperlipidemia   . Hypertension   . Neuromuscular disorder (HCC)    neuropathy  . Neuropathy [facility])     Past Surgical History:  Procedure Laterality Date  . BREAST BIOPSY Left 2016   Positive  . BREAST LUMPECTOMY WITH SENTINEL LYMPH NODE BIOPSY Left 05/23/2015   Procedure: LEFT BREAST WIDE EXCISION WITH AXILLARY DISSECTION, MASTOPLASTY ;  Surgeon: [name], MD;  Location: [facility] ORS;  Service: General;  Laterality: Left;  . BREAST SURGERY Left 12/18/14   breast biopsy/INVASIVE DUCTAL CARCINOMA OF BREAST, NOTTINGHAM GRADE 2.  Marland Kitchen BREAST SURGERY  05/23/2015.   Wide excision/mastoplasty, axillary dissection. No residual invasive cancer, positive for residual DCIS. 0/2 nodes identified on axillary dissection. (no SLN by technetium or methylene blue)  . CARDIAC CATHETERIZATION    . PORTACATH PLACEMENT Right 12-31-14   Dr Bary Castilla    Family History  Problem Relation Age of Onset  . Breast cancer Maternal Aunt   . Breast cancer Cousin   . Brain cancer Maternal Uncle     Social History   Social History  . Marital status: Divorced    Spouse name: N/A  . Number of children: N/A  . Years of education: N/A    Occupational History  . Not on file.   Social History Main Topics  . Smoking status: Current Every Day Smoker    Packs/day: 0.50    Years: 18.00    Types: Cigarettes  . Smokeless tobacco: Never Used  . Alcohol use No  . Drug use: No  . Sexual activity: Not on file   Other Topics Concern  . Not on file   Social History Narrative  . No narrative on file     Current Outpatient Prescriptions:  .  anastrozole (ARIMIDEX) 1 MG tablet, Take 1 tablet (1 mg total) by mouth daily., Disp: 90 tablet, Rfl: 3 .  aspirin 81 MG tablet, Take 81 mg by mouth daily., Disp: , Rfl:  .  carvedilol (COREG) 6.25 MG tablet, Take 1 tablet (6.25 mg total) by mouth 2 (two) times daily., Disp: 180 tablet, Rfl: 0 .  DULoxetine (CYMBALTA) 60 MG capsule, TAKE 1 CAPSULE (60 MG TOTAL) BY MOUTH DAILY., Disp: 30 capsule, Rfl: 0 .  lisinopril (PRINIVIL,ZESTRIL) 10 MG tablet, TAKE 1 TABLET DAILY, Disp: 90 tablet, Rfl: 0 .  Neratinib Maleate 40 MG TABS, Take 160 mg by mouth daily. NOT to start until okay by MD, Disp: 120 tablet, Rfl: 3 .  simvastatin (ZOCOR) 40 MG tablet, TAKE 1 TABLET DAILY AT 6 P.M., Disp: 90 tablet, Rfl: 0 No current facility-administered medications for this visit.   Facility-Administered Medications Ordered in  Other Visits:  .  0.9 %  sodium chloride infusion, , Intravenous, Continuous, [name], MD .  0.9 %  sodium chloride infusion, , Intravenous, Continuous, Lloyd Huger, MD, Last Rate: 999 mL/hr at 04/24/15 1520 .  heparin lock flush 100 unit/mL, 500 Units, Intravenous, Once, Dmitriy Berenzon, MD .  sodium chloride 0.9 % injection 10 mL, 10 mL, Intravenous, PRN, [name], MD, 10 mL at 04/04/15 1440 .  sodium chloride flush (NS) 0.9 % injection 10 mL, 10 mL, Intravenous, PRN, Roxana Hires, MD  Allergies  Allergen Reactions  . No Known Allergies      Review of Systems  Constitutional: Negative for chills and fever.  HENT: Positive for congestion and sore throat.  Negative for sinus pressure.   Respiratory: Positive for cough.      Objective  Vitals:   08/25/16 1554  BP: 130/80  Pulse: 88  Resp: 16  Temp: 98.4 F (36.9 C)  TempSrc: Oral  SpO2: 96%  Weight: 165 lb 9.6 oz (75.1 kg)  Height: 5' 6"  (1.676 m)    Physical Exam  Constitutional: She is oriented to person, place, and time and well-developed, well-nourished, and in no distress.  HENT:  Head: Normocephalic and atraumatic.  Nose: Right sinus exhibits no maxillary sinus tenderness and no frontal sinus tenderness. Left sinus exhibits no maxillary sinus tenderness and no frontal sinus tenderness.  Mouth/Throat: Posterior oropharyngeal erythema present.  Nasal mucosal inflammation, turbinate hypertrophy.  Cardiovascular: Normal rate, regular rhythm, S1 normal, S2 normal and normal heart sounds.   No murmur heard. Pulmonary/Chest: Effort normal and breath sounds normal. She has no wheezes.  Neurological: She is alert and oriented to person, place, and time.  Psychiatric: Mood, memory, affect and judgment normal.  Nursing note and vitals reviewed.   Assessment & Plan  1. URI with cough and congestion Advised to start taking azithromycin within the next 48 hours if her symptoms do not improve with conservative therapy. Added Cheratussin for relief of cough - azithromycin (ZITHROMAX) 250 MG tablet; 2 tabs po day 1, then 1 tab po q day x 4 days  Dispense: 6 each; Refill: 0 - guaiFENesin-codeine (CHERATUSSIN AC) 100-10 MG/5ML syrup; Take 10 mLs by mouth 4 (four) times daily as needed for cough.  Dispense: 400 mL; Refill: 0   Damein Gaunce Asad A[facility] 08/25/2016 3:59 PM

## 2016-08-28 ENCOUNTER — Inpatient Hospital Stay: Payer: BLUE CROSS/BLUE SHIELD | Attending: Internal Medicine

## 2016-08-28 DIAGNOSIS — Z923 Personal history of irradiation: Secondary | ICD-10-CM | POA: Insufficient documentation

## 2016-08-28 DIAGNOSIS — R948 Abnormal results of function studies of other organs and systems: Secondary | ICD-10-CM | POA: Diagnosis not present

## 2016-08-28 DIAGNOSIS — Z808 Family history of malignant neoplasm of other organs or systems: Secondary | ICD-10-CM | POA: Insufficient documentation

## 2016-08-28 DIAGNOSIS — R197 Diarrhea, unspecified: Secondary | ICD-10-CM | POA: Insufficient documentation

## 2016-08-28 DIAGNOSIS — Z79899 Other long term (current) drug therapy: Secondary | ICD-10-CM | POA: Insufficient documentation

## 2016-08-28 DIAGNOSIS — Z9221 Personal history of antineoplastic chemotherapy: Secondary | ICD-10-CM | POA: Diagnosis not present

## 2016-08-28 DIAGNOSIS — I10 Essential (primary) hypertension: Secondary | ICD-10-CM | POA: Diagnosis not present

## 2016-08-28 DIAGNOSIS — Z170 Estrogen receptor positive status [ER+]: Secondary | ICD-10-CM | POA: Insufficient documentation

## 2016-08-28 DIAGNOSIS — E785 Hyperlipidemia, unspecified: Secondary | ICD-10-CM | POA: Diagnosis not present

## 2016-08-28 DIAGNOSIS — Z7982 Long term (current) use of aspirin: Secondary | ICD-10-CM | POA: Insufficient documentation

## 2016-08-28 DIAGNOSIS — C50412 Malignant neoplasm of upper-outer quadrant of left female breast: Secondary | ICD-10-CM | POA: Diagnosis not present

## 2016-08-28 DIAGNOSIS — G629 Polyneuropathy, unspecified: Secondary | ICD-10-CM | POA: Insufficient documentation

## 2016-08-28 DIAGNOSIS — Z452 Encounter for adjustment and management of vascular access device: Secondary | ICD-10-CM | POA: Insufficient documentation

## 2016-08-28 DIAGNOSIS — Z79811 Long term (current) use of aromatase inhibitors: Secondary | ICD-10-CM | POA: Diagnosis not present

## 2016-08-28 DIAGNOSIS — I252 Old myocardial infarction: Secondary | ICD-10-CM | POA: Insufficient documentation

## 2016-08-28 DIAGNOSIS — M8580 Other specified disorders of bone density and structure, unspecified site: Secondary | ICD-10-CM | POA: Diagnosis not present

## 2016-08-28 DIAGNOSIS — F17210 Nicotine dependence, cigarettes, uncomplicated: Secondary | ICD-10-CM | POA: Diagnosis not present

## 2016-08-28 DIAGNOSIS — Z803 Family history of malignant neoplasm of breast: Secondary | ICD-10-CM | POA: Insufficient documentation

## 2016-08-28 DIAGNOSIS — Z95828 Presence of other vascular implants and grafts: Secondary | ICD-10-CM

## 2016-08-28 MED ORDER — SODIUM CHLORIDE 0.9% FLUSH
10.0000 mL | INTRAVENOUS | Status: DC | PRN
Start: 2016-08-28 — End: 2016-08-28
  Administered 2016-08-28: 10 mL via INTRAVENOUS
  Filled 2016-08-28: qty 10

## 2016-08-28 MED ORDER — HEPARIN SOD (PORK) LOCK FLUSH 100 UNIT/ML IV SOLN
500.0000 [IU] | Freq: Once | INTRAVENOUS | Status: AC
  Administered 2016-08-28: 500 [IU] via INTRAVENOUS

## 2016-08-28 MED ORDER — HEPARIN SOD (PORK) LOCK FLUSH 100 UNIT/ML IV SOLN
INTRAVENOUS | Status: AC
  Filled 2016-08-28: qty 5

## 2016-09-02 ENCOUNTER — Telehealth: Payer: Self-pay | Admitting: *Deleted

## 2016-09-02 NOTE — Telephone Encounter (Signed)
Patient returned phone call - patient rcvd drug last week.  Started Neratinib- on Monday 08/31/16 - She is taking antidiarrhea - prophylaxis with immodium. Denies diarrhea as she has taken imodium ad 4 pills a day. Yesterday she took "6 tablets by accident of imodium ad." now c/o "constipation." I encouraged pt to drink plenty of fluids. She stated that "I will hold my next found of imodium tonight to see if this help. If I start having diarrhea, I will go back to taking an imodium immediately.  Pt schedule To see Dr. Rogue Bussing on Monday 09/07/16.

## 2016-09-02 NOTE — Telephone Encounter (Signed)
Left vm on patient's phone asking pt to contact cancer center. Need to f/u whether patient rcvd shipment of neratinib and drug tolerance.

## 2016-09-07 ENCOUNTER — Inpatient Hospital Stay (HOSPITAL_BASED_OUTPATIENT_CLINIC_OR_DEPARTMENT_OTHER): Payer: BLUE CROSS/BLUE SHIELD | Admitting: Internal Medicine

## 2016-09-07 ENCOUNTER — Inpatient Hospital Stay: Payer: BLUE CROSS/BLUE SHIELD

## 2016-09-07 VITALS — BP 100/68 | HR 91 | Temp 96.5°F | Wt 162.0 lb

## 2016-09-07 DIAGNOSIS — F17210 Nicotine dependence, cigarettes, uncomplicated: Secondary | ICD-10-CM | POA: Diagnosis not present

## 2016-09-07 DIAGNOSIS — Z803 Family history of malignant neoplasm of breast: Secondary | ICD-10-CM

## 2016-09-07 DIAGNOSIS — Z7982 Long term (current) use of aspirin: Secondary | ICD-10-CM | POA: Diagnosis not present

## 2016-09-07 DIAGNOSIS — M8580 Other specified disorders of bone density and structure, unspecified site: Secondary | ICD-10-CM | POA: Diagnosis not present

## 2016-09-07 DIAGNOSIS — E785 Hyperlipidemia, unspecified: Secondary | ICD-10-CM | POA: Diagnosis not present

## 2016-09-07 DIAGNOSIS — C50412 Malignant neoplasm of upper-outer quadrant of left female breast: Secondary | ICD-10-CM | POA: Diagnosis not present

## 2016-09-07 DIAGNOSIS — R197 Diarrhea, unspecified: Secondary | ICD-10-CM | POA: Diagnosis not present

## 2016-09-07 DIAGNOSIS — Z170 Estrogen receptor positive status [ER+]: Secondary | ICD-10-CM

## 2016-09-07 DIAGNOSIS — Z452 Encounter for adjustment and management of vascular access device: Secondary | ICD-10-CM | POA: Diagnosis not present

## 2016-09-07 DIAGNOSIS — I10 Essential (primary) hypertension: Secondary | ICD-10-CM | POA: Diagnosis not present

## 2016-09-07 DIAGNOSIS — I252 Old myocardial infarction: Secondary | ICD-10-CM | POA: Diagnosis not present

## 2016-09-07 DIAGNOSIS — Z923 Personal history of irradiation: Secondary | ICD-10-CM | POA: Diagnosis not present

## 2016-09-07 DIAGNOSIS — Z9221 Personal history of antineoplastic chemotherapy: Secondary | ICD-10-CM

## 2016-09-07 DIAGNOSIS — Z79811 Long term (current) use of aromatase inhibitors: Secondary | ICD-10-CM

## 2016-09-07 DIAGNOSIS — G629 Polyneuropathy, unspecified: Secondary | ICD-10-CM

## 2016-09-07 DIAGNOSIS — Z79899 Other long term (current) drug therapy: Secondary | ICD-10-CM

## 2016-09-07 DIAGNOSIS — Z808 Family history of malignant neoplasm of other organs or systems: Secondary | ICD-10-CM

## 2016-09-07 DIAGNOSIS — R948 Abnormal results of function studies of other organs and systems: Secondary | ICD-10-CM

## 2016-09-07 LAB — COMPREHENSIVE METABOLIC PANEL
ALBUMIN: 4.2 g/dL (ref 3.5–5.0)
ALT: 60 U/L — ABNORMAL HIGH (ref 14–54)
ANION GAP: 8 (ref 5–15)
AST: 63 U/L — ABNORMAL HIGH (ref 15–41)
Alkaline Phosphatase: 137 U/L — ABNORMAL HIGH (ref 38–126)
BUN: 18 mg/dL (ref 6–20)
CHLORIDE: 99 mmol/L — AB (ref 101–111)
CO2: 26 mmol/L (ref 22–32)
Calcium: 9 mg/dL (ref 8.9–10.3)
Creatinine, Ser: 0.96 mg/dL (ref 0.44–1.00)
GFR calc non Af Amer: >60 mL/min (ref >60)
GLUCOSE: 88 mg/dL (ref 65–99)
POTASSIUM: 3.7 mmol/L (ref 3.5–5.1)
SODIUM: 133 mmol/L — AB (ref 135–145)
Total Bilirubin: 0.6 mg/dL (ref 0.3–1.2)
Total Protein: 7.9 g/dL (ref 6.5–8.1)

## 2016-09-07 LAB — CBC WITH DIFFERENTIAL/PLATELET
BASOS PCT: 1 %
Basophils Absolute: 0 10*3/uL (ref 0–0.1)
EOS ABS: 0.3 10*3/uL (ref 0–0.7)
EOS PCT: 4 %
HCT: 42 % (ref 35.0–47.0)
HEMOGLOBIN: 14.2 g/dL (ref 12.0–16.0)
Lymphocytes Relative: 24 %
Lymphs Abs: 1.7 10*3/uL (ref 1.0–3.6)
MCH: 31.4 pg (ref 26.0–34.0)
MCHC: 33.8 g/dL (ref 32.0–36.0)
MCV: 92.9 fL (ref 80.0–100.0)
MONO ABS: 0.3 10*3/uL (ref 0.2–0.9)
MONOS PCT: 5 %
NEUTROS PCT: 66 %
Neutro Abs: 4.8 10*3/uL (ref 1.4–6.5)
PLATELETS: 268 10*3/uL (ref 150–440)
RBC: 4.52 MIL/uL (ref 3.80–5.20)
RDW: 13.4 % (ref 11.5–14.5)
WBC: 7 10*3/uL (ref 3.6–11.0)

## 2016-09-07 MED ORDER — DIPHENOXYLATE-ATROPINE 2.5-0.025 MG PO TABS: 1.0000 | ORAL_TABLET | Freq: Four times a day (QID) | ORAL | 0 refills | Status: DC | PRN

## 2016-09-07 NOTE — Progress Notes (Signed)
Patient here today for follow up on Carcinoma of upper-outer quadrant of left breast in female.  Patient c/o diarrhea with the use of Neratinib Maleate, patient did not take this medication today or yesterday

## 2016-09-07 NOTE — Progress Notes (Signed)
[facility] OFFICE PROGRESS NOTE  Patient Care Team: [name], MD as PCP - General (Family Medicine) [name], MD (General Surgery) [name], MD (Inactive) as Attending Physician (Internal Medicine)  No matching staging information was found for the patient.   Oncology History   # LEFT BREAST IDC; STAGE II [cT2N1] ER >90%; PR- 50-90%; her 2 NEU- POS; s/p Neoadj chemo; AUG 2016- TCH+P s/p Lumpec & partial ALND- path CR; s/p RT [finished Nov 2016];  adj Herceptin; HELD for Jan 19th 2017 [in DEC 2016-EF dropped from 63 to 51%; FEB 27th EF-42.9%]; JAN 2016 START Arimidex; May 2017- EF- 60%; May 24th 2017-Re-start Herceptin q 3W; July 28th STOP herceptin [finished 74m/m2]  # DEC 4th 2017- Start Neratinib 4 pills; DEC 11th 3 pills  # PN- G-2; may 2017- Start cymbalta 636md  # April 2016- Liver Bx- NEG;   # Drop in EF from Herceptin [recovered OCt 27th- EF 65%]  # BMD- jan 2017- osteopenia     Carcinoma of upper-outer quadrant of left breast in female, estrogen receptor positive ([place]  12/27/2014 Initial Diagnosis    Breast cancer of upper-outer quadrant of left female breast ([facility]       INTERVAL HISTORY:  A very pleasant [age]ear old female patient with above history of stage II ER/PR positive HER-2/neu positive breast cancer currently Arimidex is here for follow-up; Patient recently started on Neratinib- approximately 1 week ago.  Patient had been on prophylactic Imodium 2 pills 3 times a day; initially she was constipated. However after 3 days or so she noted to have diarrhea in spite of taking Imodium prophylaxis. On the last 2 days she had about 5-6 loose stools; in spite of not taking neratinib.    She denies any new lumps or bumps. Denies any cough. Patient lost about 3 pounds the last. Patient denies any shortness of breath. Denies any chest pain. Denies any swelling in the legs. No headaches vision changes or double vision.    REVIEW  OF SYSTEMS:  A complete 10 point review of system is done which is negative except mentioned above/history of present illness.   PAST MEDICAL HISTORY :  Past Medical History:  Diagnosis Date  . Breast cancer ([facility]   Left-radiation  . Cancer ([facility]   Stage II, ER positive, PR positive, HER-2/neu overexpressing of the left breast.  . Chemotherapy-induced peripheral neuropathy ([facility]  . Heart attack 1998  . Hyperlipidemia   . Hypertension   . Neuromuscular disorder (HCC)    neuropathy  . [place]athy ([facility]    PAST SURGICAL HISTORY :   Past Surgical History:  Procedure Laterality Date  . BREAST BIOPSY Left 2016   Positive  . BREAST LUMPECTOMY WITH SENTINEL LYMPH NODE BIOPSY Left 05/23/2015   Procedure: LEFT BREAST WIDE EXCISION WITH AXILLARY DISSECTION, MASTOPLASTY ;  Surgeon: JeRobert BellowMD;  Location: [facility] ORS;  Service: General;  Laterality: Left;  . BREAST SURGERY Left 12/18/14   breast biopsy/INVASIVE DUCTAL CARCINOMA OF BREAST, NOTTINGHAM GRADE 2.  . Marland KitchenREAST SURGERY  05/23/2015.   Wide excision/mastoplasty, axillary dissection. No residual invasive cancer, positive for residual DCIS. 0/2 nodes identified on axillary dissection. (no SLN by technetium or methylene blue)  . CARDIAC CATHETERIZATION    . PORTACATH PLACEMENT Right 12-31-14   Dr ByBary Castilla  FAMILY HISTORY :   Family History  Problem Relation Age of Onset  . Breast cancer Maternal Aunt   .  Breast cancer Cousin   . Brain cancer Maternal Uncle     SOCIAL HISTORY:   Social History  Substance Use Topics  . Smoking status: Current Every Day Smoker    Packs/day: 0.50    Years: 18.00    Types: Cigarettes  . Smokeless tobacco: Never Used  . Alcohol use No    ALLERGIES:  is allergic to no known allergies.  MEDICATIONS:  Current Outpatient Prescriptions  Medication Sig Dispense Refill  . anastrozole (ARIMIDEX) 1 MG tablet Take 1 tablet (1 mg total) by mouth daily. 90 tablet 3  . aspirin 81 MG  tablet Take 81 mg by mouth daily.    . carvedilol (COREG) 6.25 MG tablet Take 1 tablet (6.25 mg total) by mouth 2 (two) times daily. 180 tablet 0  . DULoxetine (CYMBALTA) 60 MG capsule TAKE 1 CAPSULE (60 MG TOTAL) BY MOUTH DAILY. 30 capsule 0  . lisinopril (PRINIVIL,ZESTRIL) 10 MG tablet TAKE 1 TABLET DAILY 90 tablet 0  . Neratinib Maleate 40 MG TABS Take 160 mg by mouth daily. NOT to start until okay by MD 120 tablet 3  . simvastatin (ZOCOR) 40 MG tablet TAKE 1 TABLET DAILY AT 6 P.M. 90 tablet 0  . diphenoxylate-atropine (LOMOTIL) 2.5-0.025 MG tablet Take 1 tablet by mouth 4 (four) times daily as needed for diarrhea or loose stools. Take it along with immodium 60 tablet 0   No current facility-administered medications for this visit.    Facility-Administered Medications Ordered in Other Visits  Medication Dose Route Frequency Provider Last Rate Last Dose  . 0.9 %  sodium chloride infusion   Intravenous Continuous [name], MD      . 0.9 %  sodium chloride infusion   Intravenous Continuous Lloyd Huger, MD 999 mL/hr at 04/24/15 1520    . heparin lock flush 100 unit/mL  500 Units Intravenous Once Dmitriy Berenzon, MD      . sodium chloride 0.9 % injection 10 mL  10 mL Intravenous PRN [name], MD   10 mL at 04/04/15 1440  . sodium chloride flush (NS) 0.9 % injection 10 mL  10 mL Intravenous PRN Dmitriy Berenzon, MD        PHYSICAL EXAMINATION: ECOG PERFORMANCE STATUS: 0 - Asymptomatic  BP 100/68 (BP Location: Right Arm, Patient Position: Sitting)   Pulse 91   Temp (!) 96.5 F (35.8 C) (Tympanic)   Wt 162 lb (73.5 kg)   BMI 26.15 kg/m   Filed Weights   09/07/16 1535  Weight: 162 lb (73.5 kg)    GENERAL: Well-nourished well-developed; Alert, no distress and comfortable.  Alone. EYES: no pallor or icterus OROPHARYNX: no thrush or ulceration; good dentition  NECK: supple, no masses felt LYMPH:  no palpable lymphadenopathy in the cervical, axillary or inguinal  regions LUNGS: clear to auscultation and  No wheeze or crackles HEART/CVS: regular rate & rhythm and no murmurs; No lower extremity edema ABDOMEN:abdomen soft, non-tender and normal bowel sounds Musculoskeletal:no cyanosis of digits and no clubbing  PSYCH: alert & oriented x 3 with fluent speech NEURO: no focal motor/sensory deficits   LABORATORY DATA:  I have reviewed the data as listed    Component Value Date/Time   NA 133 (L) 09/07/2016 1504   NA 135 01/08/2015 0850   K 3.7 09/07/2016 1504   K 3.7 01/08/2015 0850   CL 99 (L) 09/07/2016 1504   CL 101 01/08/2015 0850   CO2 26 09/07/2016 1504   CO2 26 01/08/2015 0850  GLUCOSE 88 09/07/2016 1504   GLUCOSE 161 (H) 01/08/2015 0850   BUN 18 09/07/2016 1504   BUN 17 01/08/2015 0850   CREATININE 0.96 09/07/2016 1504   CREATININE 0.82 01/22/2015 1559   CALCIUM 9.0 09/07/2016 1504   CALCIUM 9.3 01/08/2015 0850   PROT 7.9 09/07/2016 1504   PROT 6.8 01/22/2015 1559   ALBUMIN 4.2 09/07/2016 1504   ALBUMIN 3.9 01/22/2015 1559   AST 63 (H) 09/07/2016 1504   AST 34 01/22/2015 1559   ALT 60 (H) 09/07/2016 1504   ALT 42 01/22/2015 1559   ALKPHOS 137 (H) 09/07/2016 1504   ALKPHOS 157 (H) 01/22/2015 1559   BILITOT 0.6 09/07/2016 1504   BILITOT 0.2 (L) 01/22/2015 1559   GFRNONAA >60 09/07/2016 1504   GFRNONAA >60 01/22/2015 1559   GFRAA >60 09/07/2016 1504   GFRAA >60 01/22/2015 1559    No results found for: SPEP, UPEP  Lab Results  Component Value Date   WBC 7.0 09/07/2016   NEUTROABS 4.8 09/07/2016   HGB 14.2 09/07/2016   HCT 42.0 09/07/2016   MCV 92.9 09/07/2016   PLT 268 09/07/2016      Chemistry      Component Value Date/Time   NA 133 (L) 09/07/2016 1504   NA 135 01/08/2015 0850   K 3.7 09/07/2016 1504   K 3.7 01/08/2015 0850   CL 99 (L) 09/07/2016 1504   CL 101 01/08/2015 0850   CO2 26 09/07/2016 1504   CO2 26 01/08/2015 0850   BUN 18 09/07/2016 1504   BUN 17 01/08/2015 0850   CREATININE 0.96 09/07/2016  1504   CREATININE 0.82 01/22/2015 1559      Component Value Date/Time   CALCIUM 9.0 09/07/2016 1504   CALCIUM 9.3 01/08/2015 0850   ALKPHOS 137 (H) 09/07/2016 1504   ALKPHOS 157 (H) 01/22/2015 1559   AST 63 (H) 09/07/2016 1504   AST 34 01/22/2015 1559   ALT 60 (H) 09/07/2016 1504   ALT 42 01/22/2015 1559   BILITOT 0.6 09/07/2016 1504   BILITOT 0.2 (L) 01/22/2015 1559            LABORATORY DATA:  I have reviewed the data as listed    Component Value Date/Time   NA 133 (L) 09/07/2016 1504   NA 135 01/08/2015 0850   K 3.7 09/07/2016 1504   K 3.7 01/08/2015 0850   CL 99 (L) 09/07/2016 1504   CL 101 01/08/2015 0850   CO2 26 09/07/2016 1504   CO2 26 01/08/2015 0850   GLUCOSE 88 09/07/2016 1504   GLUCOSE 161 (H) 01/08/2015 0850   BUN 18 09/07/2016 1504   BUN 17 01/08/2015 0850   CREATININE 0.96 09/07/2016 1504   CREATININE 0.82 01/22/2015 1559   CALCIUM 9.0 09/07/2016 1504   CALCIUM 9.3 01/08/2015 0850   PROT 7.9 09/07/2016 1504   PROT 6.8 01/22/2015 1559   ALBUMIN 4.2 09/07/2016 1504   ALBUMIN 3.9 01/22/2015 1559   AST 63 (H) 09/07/2016 1504   AST 34 01/22/2015 1559   ALT 60 (H) 09/07/2016 1504   ALT 42 01/22/2015 1559   ALKPHOS 137 (H) 09/07/2016 1504   ALKPHOS 157 (H) 01/22/2015 1559   BILITOT 0.6 09/07/2016 1504   BILITOT 0.2 (L) 01/22/2015 1559   GFRNONAA >60 09/07/2016 1504   GFRNONAA >60 01/22/2015 1559   GFRAA >60 09/07/2016 1504   GFRAA >60 01/22/2015 1559    No results found for: SPEP, UPEP  Lab Results  Component Value Date  WBC 7.0 09/07/2016   NEUTROABS 4.8 09/07/2016   HGB 14.2 09/07/2016   HCT 42.0 09/07/2016   MCV 92.9 09/07/2016   PLT 268 09/07/2016      Chemistry      Component Value Date/Time   NA 133 (L) 09/07/2016 1504   NA 135 01/08/2015 0850   K 3.7 09/07/2016 1504   K 3.7 01/08/2015 0850   CL 99 (L) 09/07/2016 1504   CL 101 01/08/2015 0850   CO2 26 09/07/2016 1504   CO2 26 01/08/2015 0850   BUN 18 09/07/2016 1504    BUN 17 01/08/2015 0850   CREATININE 0.96 09/07/2016 1504   CREATININE 0.82 01/22/2015 1559      Component Value Date/Time   CALCIUM 9.0 09/07/2016 1504   CALCIUM 9.3 01/08/2015 0850   ALKPHOS 137 (H) 09/07/2016 1504   ALKPHOS 157 (H) 01/22/2015 1559   AST 63 (H) 09/07/2016 1504   AST 34 01/22/2015 1559   ALT 60 (H) 09/07/2016 1504   ALT 42 01/22/2015 1559   BILITOT 0.6 09/07/2016 1504   BILITOT 0.2 (L) 01/22/2015 1559     IMPRESSION: Normal left ventricular wall motion with estimated ejection fraction of 65%.   Electronically Signed   By: Marijo Sanes M.D.   On: 07/27/2016 09:04  RADIOGRAPHIC STUDIES: I have personally reviewed the radiological images as listed and agreed with the findings in the report. No results found.   ASSESSMENT & PLAN:  Carcinoma of upper-outer quadrant of left breast in female, estrogen receptor positive ([place]) Stage II breast cancer-ER/PR positive HER-2/neu positive. Currently on adjuvant Herceptin. No clinical evidence of recurrence. Continue Anastrazole.   # tolerating Neratinib with expected with side effect of diarrhea; decrease the dose to 3 pills a day; see discussion below  # Diarrhea G-2; recommend immodium 1 pill TID; with Neratinib 3 pills once a day. Will also prescribe lomotil q 6 hours as needed.   # Grade 1- improvement noted on Cymbalta.not any worse or better.  # LFT elevation G-1. ? Neratinib; repeat in 1 week.   # port flushes every 6-8 weeks; will keep for now.    Orders Placed This Encounter  Procedures  . Hepatic function panel    Standing Status:   Future    Standing Expiration Date:   09/07/2017  . CBC with Differential    Standing Status:   Future    Standing Expiration Date:   09/07/2017  . Comprehensive metabolic panel    Standing Status:   Future    Standing Expiration Date:   09/07/2017   All questions were answered. The patient knows to call the clinic with any problems, questions or concerns.       Cammie Sickle, MD 09/07/2016 4:21 PM

## 2016-09-07 NOTE — Assessment & Plan Note (Addendum)
Stage II breast cancer-ER/PR positive HER-2/neu positive. Currently on adjuvant Herceptin. No clinical evidence of recurrence. Continue Anastrazole.   # tolerating Neratinib with expected with side effect of diarrhea; decrease the dose to 3 pills a day; see discussion below  # Diarrhea G-2; recommend immodium 1 pill TID; with Neratinib 3 pills once a day. Will also prescribe lomotil q 6 hours as needed.   # Grade 1- improvement noted on Cymbalta.not any worse or better.  # LFT elevation G-1. ? Neratinib; repeat in 1 week.   # port flushes every 6-8 weeks; will keep for now.

## 2016-09-14 ENCOUNTER — Inpatient Hospital Stay: Payer: BLUE CROSS/BLUE SHIELD

## 2016-09-16 ENCOUNTER — Telehealth: Payer: Self-pay | Admitting: *Deleted

## 2016-09-16 ENCOUNTER — Inpatient Hospital Stay: Payer: BLUE CROSS/BLUE SHIELD

## 2016-09-16 DIAGNOSIS — Z9221 Personal history of antineoplastic chemotherapy: Secondary | ICD-10-CM | POA: Diagnosis not present

## 2016-09-16 DIAGNOSIS — F17210 Nicotine dependence, cigarettes, uncomplicated: Secondary | ICD-10-CM | POA: Diagnosis not present

## 2016-09-16 DIAGNOSIS — J019 Acute sinusitis, unspecified: Secondary | ICD-10-CM | POA: Diagnosis not present

## 2016-09-16 DIAGNOSIS — Z170 Estrogen receptor positive status [ER+]: Principal | ICD-10-CM

## 2016-09-16 DIAGNOSIS — Z923 Personal history of irradiation: Secondary | ICD-10-CM | POA: Diagnosis not present

## 2016-09-16 DIAGNOSIS — M8580 Other specified disorders of bone density and structure, unspecified site: Secondary | ICD-10-CM | POA: Diagnosis not present

## 2016-09-16 DIAGNOSIS — B9689 Other specified bacterial agents as the cause of diseases classified elsewhere: Secondary | ICD-10-CM | POA: Diagnosis not present

## 2016-09-16 DIAGNOSIS — C50412 Malignant neoplasm of upper-outer quadrant of left female breast: Secondary | ICD-10-CM

## 2016-09-16 DIAGNOSIS — Z7982 Long term (current) use of aspirin: Secondary | ICD-10-CM | POA: Diagnosis not present

## 2016-09-16 DIAGNOSIS — R197 Diarrhea, unspecified: Secondary | ICD-10-CM | POA: Diagnosis not present

## 2016-09-16 DIAGNOSIS — I10 Essential (primary) hypertension: Secondary | ICD-10-CM | POA: Diagnosis not present

## 2016-09-16 DIAGNOSIS — G629 Polyneuropathy, unspecified: Secondary | ICD-10-CM | POA: Diagnosis not present

## 2016-09-16 DIAGNOSIS — E785 Hyperlipidemia, unspecified: Secondary | ICD-10-CM | POA: Diagnosis not present

## 2016-09-16 DIAGNOSIS — Z452 Encounter for adjustment and management of vascular access device: Secondary | ICD-10-CM | POA: Diagnosis not present

## 2016-09-16 DIAGNOSIS — R948 Abnormal results of function studies of other organs and systems: Secondary | ICD-10-CM | POA: Diagnosis not present

## 2016-09-16 DIAGNOSIS — Z79899 Other long term (current) drug therapy: Secondary | ICD-10-CM | POA: Diagnosis not present

## 2016-09-16 DIAGNOSIS — Z79811 Long term (current) use of aromatase inhibitors: Secondary | ICD-10-CM | POA: Diagnosis not present

## 2016-09-16 DIAGNOSIS — I252 Old myocardial infarction: Secondary | ICD-10-CM | POA: Diagnosis not present

## 2016-09-16 LAB — HEPATIC FUNCTION PANEL
ALT: 67 U/L — AB (ref 14–54)
AST: 68 U/L — AB (ref 15–41)
Albumin: 3.7 g/dL (ref 3.5–5.0)
Alkaline Phosphatase: 133 U/L — ABNORMAL HIGH (ref 38–126)
BILIRUBIN TOTAL: 0.7 mg/dL (ref 0.3–1.2)
Total Protein: 7.1 g/dL (ref 6.5–8.1)

## 2016-09-16 NOTE — Telephone Encounter (Signed)
Called patient to inform her that liver counts look good.  Per MD she should continue with current dosage of neratinib and follow up with him as scheduled.  Verbalized understanding.

## 2016-09-16 NOTE — Telephone Encounter (Signed)
-----   Message from Cammie Sickle, MD sent at 09/16/2016 12:42 PM EST ----- Please inform pt liver numbers are stable; continue neratinib pills as prescribed; follow up as planned. Dr Jacinto Reap

## 2016-09-21 ENCOUNTER — Other Ambulatory Visit: Payer: Self-pay | Admitting: Internal Medicine

## 2016-09-29 ENCOUNTER — Other Ambulatory Visit: Payer: Self-pay | Admitting: Family Medicine

## 2016-09-29 DIAGNOSIS — E785 Hyperlipidemia, unspecified: Secondary | ICD-10-CM

## 2016-09-29 DIAGNOSIS — I10 Essential (primary) hypertension: Secondary | ICD-10-CM

## 2016-09-30 NOTE — Telephone Encounter (Signed)
Medication has been refilled and sent to [facility]

## 2016-10-05 ENCOUNTER — Inpatient Hospital Stay: Payer: BLUE CROSS/BLUE SHIELD

## 2016-10-05 ENCOUNTER — Inpatient Hospital Stay: Payer: BLUE CROSS/BLUE SHIELD | Attending: Internal Medicine | Admitting: Internal Medicine

## 2016-10-05 VITALS — BP 127/81 | HR 88 | Temp 97.3°F | Wt 160.0 lb

## 2016-10-05 DIAGNOSIS — Z9221 Personal history of antineoplastic chemotherapy: Secondary | ICD-10-CM | POA: Diagnosis not present

## 2016-10-05 DIAGNOSIS — Z79899 Other long term (current) drug therapy: Secondary | ICD-10-CM | POA: Diagnosis not present

## 2016-10-05 DIAGNOSIS — R634 Abnormal weight loss: Secondary | ICD-10-CM | POA: Insufficient documentation

## 2016-10-05 DIAGNOSIS — Z170 Estrogen receptor positive status [ER+]: Principal | ICD-10-CM

## 2016-10-05 DIAGNOSIS — K769 Liver disease, unspecified: Secondary | ICD-10-CM | POA: Diagnosis not present

## 2016-10-05 DIAGNOSIS — R948 Abnormal results of function studies of other organs and systems: Secondary | ICD-10-CM | POA: Diagnosis not present

## 2016-10-05 DIAGNOSIS — Z7982 Long term (current) use of aspirin: Secondary | ICD-10-CM

## 2016-10-05 DIAGNOSIS — C50412 Malignant neoplasm of upper-outer quadrant of left female breast: Secondary | ICD-10-CM

## 2016-10-05 DIAGNOSIS — M8580 Other specified disorders of bone density and structure, unspecified site: Secondary | ICD-10-CM | POA: Diagnosis not present

## 2016-10-05 DIAGNOSIS — R748 Abnormal levels of other serum enzymes: Secondary | ICD-10-CM | POA: Insufficient documentation

## 2016-10-05 DIAGNOSIS — E785 Hyperlipidemia, unspecified: Secondary | ICD-10-CM | POA: Diagnosis not present

## 2016-10-05 DIAGNOSIS — I252 Old myocardial infarction: Secondary | ICD-10-CM

## 2016-10-05 DIAGNOSIS — G629 Polyneuropathy, unspecified: Secondary | ICD-10-CM

## 2016-10-05 DIAGNOSIS — F17210 Nicotine dependence, cigarettes, uncomplicated: Secondary | ICD-10-CM | POA: Diagnosis not present

## 2016-10-05 DIAGNOSIS — I10 Essential (primary) hypertension: Secondary | ICD-10-CM | POA: Diagnosis not present

## 2016-10-05 DIAGNOSIS — Z79811 Long term (current) use of aromatase inhibitors: Secondary | ICD-10-CM

## 2016-10-05 DIAGNOSIS — R197 Diarrhea, unspecified: Secondary | ICD-10-CM | POA: Diagnosis not present

## 2016-10-05 LAB — COMPREHENSIVE METABOLIC PANEL
ALBUMIN: 3.8 g/dL (ref 3.5–5.0)
ALK PHOS: 191 U/L — AB (ref 38–126)
ALT: 68 U/L — ABNORMAL HIGH (ref 14–54)
ANION GAP: 8 (ref 5–15)
AST: 82 U/L — AB (ref 15–41)
BILIRUBIN TOTAL: 0.7 mg/dL (ref 0.3–1.2)
BUN: 13 mg/dL (ref 6–20)
CALCIUM: 8.7 mg/dL — AB (ref 8.9–10.3)
CO2: 25 mmol/L (ref 22–32)
Chloride: 102 mmol/L (ref 101–111)
Creatinine, Ser: 0.89 mg/dL (ref 0.44–1.00)
GFR calc Af Amer: >60 mL/min (ref >60)
GLUCOSE: 106 mg/dL — AB (ref 65–99)
POTASSIUM: 3.2 mmol/L — AB (ref 3.5–5.1)
Sodium: 135 mmol/L (ref 135–145)
TOTAL PROTEIN: 7.4 g/dL (ref 6.5–8.1)

## 2016-10-05 LAB — CBC WITH DIFFERENTIAL/PLATELET
Basophils Absolute: 0 10*3/uL (ref 0–0.1)
Basophils Relative: 1 %
Eosinophils Absolute: 0.3 10*3/uL (ref 0–0.7)
Eosinophils Relative: 6 %
HCT: 38.2 % (ref 35.0–47.0)
HEMOGLOBIN: 12.9 g/dL (ref 12.0–16.0)
LYMPHS ABS: 1.4 10*3/uL (ref 1.0–3.6)
LYMPHS PCT: 25 %
MCH: 31.2 pg (ref 26.0–34.0)
MCHC: 33.9 g/dL (ref 32.0–36.0)
MCV: 92.2 fL (ref 80.0–100.0)
MONO ABS: 0.3 10*3/uL (ref 0.2–0.9)
MONOS PCT: 5 %
NEUTROS ABS: 3.4 10*3/uL (ref 1.4–6.5)
NEUTROS PCT: 63 %
Platelets: 198 10*3/uL (ref 150–440)
RBC: 4.14 MIL/uL (ref 3.80–5.20)
RDW: 13.5 % (ref 11.5–14.5)
WBC: 5.4 10*3/uL (ref 3.6–11.0)

## 2016-10-05 MED ORDER — POTASSIUM CHLORIDE CRYS ER 20 MEQ PO TBCR: EXTENDED_RELEASE_TABLET | ORAL | 3 refills | Status: DC

## 2016-10-05 NOTE — Assessment & Plan Note (Addendum)
Stage II breast cancer-ER/PR positive HER-2/neu positive. Currently on adjuvant Herceptin. No clinical evidence of recurrence. Continue Anastrazole.   # tolerating Neratinib with expected with side effect of diarrhea; also noted to have elevated LFTs [C discussion below].   # Diarrhea G-2;  immodium 1 pill TID; also Lomotil as needed with Neratinib 3 pills once a day.   # PN Grade 1- improvement noted on Cymbalta.not any worse or better.  # LFT elevation G-1. ? Neratinib; repeat in 1 week. Stop simvastatin.   # port flushes every 6-8 weeks; will keep for now.  # Repeat labs in 1 week; and then start Neratinib;  follow-up with me in 4 weeks.

## 2016-10-05 NOTE — Progress Notes (Signed)
[place] OFFICE PROGRESS NOTE  Patient Care Team: [name], MD as PCP - General (Family Medicine) [name], MD (General Surgery) [name], MD (Inactive) as Attending Physician (Internal Medicine)  No matching staging information was found for the patient.   Oncology History   # LEFT BREAST IDC; STAGE II [cT2N1] ER >90%; PR- 50-90%; her 2 NEU- POS; s/p Neoadj chemo; AUG 2016- TCH+P s/p Lumpec & partial ALND- path CR; s/p RT [finished Nov 2016];  adj Herceptin; HELD for Jan 19th 2017 [in DEC 2016-EF dropped from 63 to 51%; FEB 27th EF-42.9%]; JAN 2016 START Arimidex; May 2017- EF- 60%; May 24th 2017-Re-start Herceptin q 3W; July 28th STOP herceptin [finished 47m/m2]  # DEC 4th 2017- Start Neratinib 4 pills; DEC 11th 3 pills  # PN- G-2; may 2017- Start cymbalta 666md  # April 2016- Liver Bx- NEG;   # Drop in EF from Herceptin [recovered OCt 27th- EF 65%]  # BMD- jan 2017- osteopenia     Carcinoma of upper-outer quadrant of left breast in female, estrogen receptor positive ([facility]  12/27/2014 Initial Diagnosis    Breast cancer of upper-outer quadrant of left female breast ([facility]       INTERVAL HISTORY:  A very pleasant [age]ear old female patient with above history of stage II ER/PR positive HER-2/neu positive breast cancer currently Arimidex is here for follow-up; Patient recently started on Neratinib- approximately 1 month ago.  Patient admits to continued diarrhea up to 3 loose stools a day. She has been taking Imodium 1 pill a day only.  Patient lost about 5 pounds the last. Patient denies any shortness of breath. Denies any chest pain. Denies any swelling in the legs. No headaches vision changes or double vision.    REVIEW OF SYSTEMS:  A complete 10 point review of system is done which is negative except mentioned above/history of present illness.   PAST MEDICAL HISTORY :  Past Medical History:  Diagnosis Date  . Breast cancer  ([facility]   Left-radiation  . Cancer ([facility]   Stage II, ER positive, PR positive, HER-2/neu overexpressing of the left breast.  . Chemotherapy-induced peripheral neuropathy ([facility]  . Heart attack 1998  . Hyperlipidemia   . Hypertension   . Neuromuscular disorder ([facility])    neuropathy  . Neuropathy ([facility]    PAST SURGICAL HISTORY :   Past Surgical History:  Procedure Laterality Date  . BREAST BIOPSY Left 2016   Positive  . BREAST LUMPECTOMY WITH SENTINEL LYMPH NODE BIOPSY Left 05/23/2015   Procedure: LEFT BREAST WIDE EXCISION WITH AXILLARY DISSECTION, MASTOPLASTY ;  Surgeon: JeRobert BellowMD;  Location: [facility] ORS;  Service: General;  Laterality: Left;  . BREAST SURGERY Left 12/18/14   breast biopsy/INVASIVE DUCTAL CARCINOMA OF BREAST, NOTTINGHAM GRADE 2.  . Marland KitchenREAST SURGERY  05/23/2015.   Wide excision/mastoplasty, axillary dissection. No residual invasive cancer, positive for residual DCIS. 0/2 nodes identified on axillary dissection. (no SLN by technetium or methylene blue)  . CARDIAC CATHETERIZATION    . PORTACATH PLACEMENT Right 12-31-14   Dr ByBary Castilla  FAMILY HISTORY :   Family History  Problem Relation Age of Onset  . Breast cancer Maternal Aunt   . Breast cancer Cousin   . Brain cancer Maternal Uncle     SOCIAL HISTORY:   Social History  Substance Use Topics  . Smoking status: Current Every Day Smoker    Packs/day: 0.50  Years: 18.00    Types: Cigarettes  . Smokeless tobacco: Never Used  . Alcohol use No    ALLERGIES:  is allergic to no known allergies.  MEDICATIONS:  Current Outpatient Prescriptions  Medication Sig Dispense Refill  . anastrozole (ARIMIDEX) 1 MG tablet Take 1 tablet (1 mg total) by mouth daily. 90 tablet 3  . aspirin 81 MG tablet Take 81 mg by mouth daily.    . carvedilol (COREG) 6.25 MG tablet Take 1 tablet (6.25 mg total) by mouth 2 (two) times daily. 180 tablet 0  . diphenoxylate-atropine (LOMOTIL) 2.5-0.025 MG tablet Take 1 tablet  by mouth 4 (four) times daily as needed for diarrhea or loose stools. Take it along with immodium 60 tablet 0  . DULoxetine (CYMBALTA) 60 MG capsule TAKE 1 CAPSULE (60 MG TOTAL) BY MOUTH DAILY. 30 capsule 0  . lisinopril (PRINIVIL,ZESTRIL) 10 MG tablet TAKE 1 TABLET DAILY 90 tablet 0  . Neratinib Maleate 40 MG TABS Take 160 mg by mouth daily. NOT to start until okay by MD 120 tablet 3  . simvastatin (ZOCOR) 40 MG tablet TAKE 1 TABLET DAILY AT 6 P.M. 90 tablet 0  . potassium chloride SA (K-DUR,KLOR-CON) 20 MEQ tablet 1 pill twice a day 30 tablet 3   No current facility-administered medications for this visit.    Facility-Administered Medications Ordered in Other Visits  Medication Dose Route Frequency Provider Last Rate Last Dose  . 0.9 %  sodium chloride infusion   Intravenous Continuous [name], MD      . 0.9 %  sodium chloride infusion   Intravenous Continuous Lloyd Huger, MD 999 mL/hr at 04/24/15 1520    . heparin lock flush 100 unit/mL  500 Units Intravenous Once Dmitriy Berenzon, MD      . sodium chloride 0.9 % injection 10 mL  10 mL Intravenous PRN [name], MD   10 mL at 04/04/15 1440  . sodium chloride flush (NS) 0.9 % injection 10 mL  10 mL Intravenous PRN Dmitriy Berenzon, MD        PHYSICAL EXAMINATION: ECOG PERFORMANCE STATUS: 0 - Asymptomatic  BP 127/81 (BP Location: Right Arm, Patient Position: Sitting)   Pulse 88   Temp 97.3 F (36.3 C) (Tympanic)   Wt 160 lb (72.6 kg)   BMI 25.82 kg/m   Filed Weights   10/05/16 1604  Weight: 160 lb (72.6 kg)    GENERAL: Well-nourished well-developed; Alert, no distress and comfortable.  Alone. EYES: no pallor or icterus OROPHARYNX: no thrush or ulceration; good dentition  NECK: supple, no masses felt LYMPH:  no palpable lymphadenopathy in the cervical, axillary or inguinal regions LUNGS: clear to auscultation and  No wheeze or crackles HEART/CVS: regular rate & rhythm and no murmurs; No lower extremity  edema ABDOMEN:abdomen soft, non-tender and normal bowel sounds Musculoskeletal:no cyanosis of digits and no clubbing  PSYCH: alert & oriented x 3 with fluent speech NEURO: no focal motor/sensory deficits   LABORATORY DATA:  I have reviewed the data as listed    Component Value Date/Time   NA 135 10/05/2016 1530   NA 135 01/08/2015 0850   K 3.2 (L) 10/05/2016 1530   K 3.7 01/08/2015 0850   CL 102 10/05/2016 1530   CL 101 01/08/2015 0850   CO2 25 10/05/2016 1530   CO2 26 01/08/2015 0850   GLUCOSE 106 (H) 10/05/2016 1530   GLUCOSE 161 (H) 01/08/2015 0850   BUN 13 10/05/2016 1530   BUN 17 01/08/2015 0850  CREATININE 0.89 10/05/2016 1530   CREATININE 0.82 01/22/2015 1559   CALCIUM 8.7 (L) 10/05/2016 1530   CALCIUM 9.3 01/08/2015 0850   PROT 7.4 10/05/2016 1530   PROT 6.8 01/22/2015 1559   ALBUMIN 3.8 10/05/2016 1530   ALBUMIN 3.9 01/22/2015 1559   AST 82 (H) 10/05/2016 1530   AST 34 01/22/2015 1559   ALT 68 (H) 10/05/2016 1530   ALT 42 01/22/2015 1559   ALKPHOS 191 (H) 10/05/2016 1530   ALKPHOS 157 (H) 01/22/2015 1559   BILITOT 0.7 10/05/2016 1530   BILITOT 0.2 (L) 01/22/2015 1559   GFRNONAA >60 10/05/2016 1530   GFRNONAA >60 01/22/2015 1559   GFRAA >60 10/05/2016 1530   GFRAA >60 01/22/2015 1559    No results found for: SPEP, UPEP  Lab Results  Component Value Date   WBC 5.4 10/05/2016   NEUTROABS 3.4 10/05/2016   HGB 12.9 10/05/2016   HCT 38.2 10/05/2016   MCV 92.2 10/05/2016   PLT 198 10/05/2016      Chemistry      Component Value Date/Time   NA 135 10/05/2016 1530   NA 135 01/08/2015 0850   K 3.2 (L) 10/05/2016 1530   K 3.7 01/08/2015 0850   CL 102 10/05/2016 1530   CL 101 01/08/2015 0850   CO2 25 10/05/2016 1530   CO2 26 01/08/2015 0850   BUN 13 10/05/2016 1530   BUN 17 01/08/2015 0850   CREATININE 0.89 10/05/2016 1530   CREATININE 0.82 01/22/2015 1559      Component Value Date/Time   CALCIUM 8.7 (L) 10/05/2016 1530   CALCIUM 9.3 01/08/2015  0850   ALKPHOS 191 (H) 10/05/2016 1530   ALKPHOS 157 (H) 01/22/2015 1559   AST 82 (H) 10/05/2016 1530   AST 34 01/22/2015 1559   ALT 68 (H) 10/05/2016 1530   ALT 42 01/22/2015 1559   BILITOT 0.7 10/05/2016 1530   BILITOT 0.2 (L) 01/22/2015 1559            LABORATORY DATA:  I have reviewed the data as listed    Component Value Date/Time   NA 135 10/05/2016 1530   NA 135 01/08/2015 0850   K 3.2 (L) 10/05/2016 1530   K 3.7 01/08/2015 0850   CL 102 10/05/2016 1530   CL 101 01/08/2015 0850   CO2 25 10/05/2016 1530   CO2 26 01/08/2015 0850   GLUCOSE 106 (H) 10/05/2016 1530   GLUCOSE 161 (H) 01/08/2015 0850   BUN 13 10/05/2016 1530   BUN 17 01/08/2015 0850   CREATININE 0.89 10/05/2016 1530   CREATININE 0.82 01/22/2015 1559   CALCIUM 8.7 (L) 10/05/2016 1530   CALCIUM 9.3 01/08/2015 0850   PROT 7.4 10/05/2016 1530   PROT 6.8 01/22/2015 1559   ALBUMIN 3.8 10/05/2016 1530   ALBUMIN 3.9 01/22/2015 1559   AST 82 (H) 10/05/2016 1530   AST 34 01/22/2015 1559   ALT 68 (H) 10/05/2016 1530   ALT 42 01/22/2015 1559   ALKPHOS 191 (H) 10/05/2016 1530   ALKPHOS 157 (H) 01/22/2015 1559   BILITOT 0.7 10/05/2016 1530   BILITOT 0.2 (L) 01/22/2015 1559   GFRNONAA >60 10/05/2016 1530   GFRNONAA >60 01/22/2015 1559   GFRAA >60 10/05/2016 1530   GFRAA >60 01/22/2015 1559    No results found for: SPEP, UPEP  Lab Results  Component Value Date   WBC 5.4 10/05/2016   NEUTROABS 3.4 10/05/2016   HGB 12.9 10/05/2016   HCT 38.2 10/05/2016   MCV 92.2  10/05/2016   PLT 198 10/05/2016      Chemistry      Component Value Date/Time   NA 135 10/05/2016 1530   NA 135 01/08/2015 0850   K 3.2 (L) 10/05/2016 1530   K 3.7 01/08/2015 0850   CL 102 10/05/2016 1530   CL 101 01/08/2015 0850   CO2 25 10/05/2016 1530   CO2 26 01/08/2015 0850   BUN 13 10/05/2016 1530   BUN 17 01/08/2015 0850   CREATININE 0.89 10/05/2016 1530   CREATININE 0.82 01/22/2015 1559      Component Value Date/Time    CALCIUM 8.7 (L) 10/05/2016 1530   CALCIUM 9.3 01/08/2015 0850   ALKPHOS 191 (H) 10/05/2016 1530   ALKPHOS 157 (H) 01/22/2015 1559   AST 82 (H) 10/05/2016 1530   AST 34 01/22/2015 1559   ALT 68 (H) 10/05/2016 1530   ALT 42 01/22/2015 1559   BILITOT 0.7 10/05/2016 1530   BILITOT 0.2 (L) 01/22/2015 1559     IMPRESSION: Normal left ventricular wall motion with estimated ejection fraction of 65%.   Electronically Signed   By: Marijo Sanes M.D.   On: 07/27/2016 09:04  RADIOGRAPHIC STUDIES: I have personally reviewed the radiological images as listed and agreed with the findings in the report. No results found.   ASSESSMENT & PLAN:  Carcinoma of upper-outer quadrant of left breast in female, estrogen receptor positive ([place]) Stage II breast cancer-ER/PR positive HER-2/neu positive. Currently on adjuvant Herceptin. No clinical evidence of recurrence. Continue Anastrazole.   # tolerating Neratinib with expected with side effect of diarrhea; also noted to have elevated LFTs [C discussion below].   # Diarrhea G-2;  immodium 1 pill TID; also Lomotil as needed with Neratinib 3 pills once a day.   # PN Grade 1- improvement noted on Cymbalta.not any worse or better.  # LFT elevation G-1. ? Neratinib; repeat in 1 week. Stop simvastatin.   # port flushes every 6-8 weeks; will keep for now.  # Repeat labs in 1 week; and then start Neratinib;  follow-up with me in 4 weeks.   Orders Placed This Encounter  Procedures  . CBC with Differential    Standing Status:   Future    Standing Expiration Date:   10/05/2017  . Comprehensive metabolic panel    Standing Status:   Future    Standing Expiration Date:   10/05/2017  . Comprehensive metabolic panel    Standing Status:   Future    Standing Expiration Date:   10/05/2017   All questions were answered. The patient knows to call the clinic with any problems, questions or concerns.      Cammie Sickle, MD 10/05/2016 4:44 PM

## 2016-10-05 NOTE — Progress Notes (Signed)
Patient here today for follow up.  Patient c/o nausea, vomiting and diarrhea since starting Neratinib.

## 2016-10-07 DIAGNOSIS — H2513 Age-related nuclear cataract, bilateral: Secondary | ICD-10-CM | POA: Diagnosis not present

## 2016-10-12 ENCOUNTER — Telehealth: Payer: Self-pay | Admitting: *Deleted

## 2016-10-12 ENCOUNTER — Inpatient Hospital Stay: Payer: BLUE CROSS/BLUE SHIELD

## 2016-10-12 DIAGNOSIS — K769 Liver disease, unspecified: Secondary | ICD-10-CM | POA: Diagnosis not present

## 2016-10-12 DIAGNOSIS — M8580 Other specified disorders of bone density and structure, unspecified site: Secondary | ICD-10-CM | POA: Diagnosis not present

## 2016-10-12 DIAGNOSIS — Z7982 Long term (current) use of aspirin: Secondary | ICD-10-CM | POA: Diagnosis not present

## 2016-10-12 DIAGNOSIS — C50412 Malignant neoplasm of upper-outer quadrant of left female breast: Secondary | ICD-10-CM | POA: Diagnosis not present

## 2016-10-12 DIAGNOSIS — Z170 Estrogen receptor positive status [ER+]: Secondary | ICD-10-CM | POA: Diagnosis not present

## 2016-10-12 DIAGNOSIS — I10 Essential (primary) hypertension: Secondary | ICD-10-CM | POA: Diagnosis not present

## 2016-10-12 DIAGNOSIS — E785 Hyperlipidemia, unspecified: Secondary | ICD-10-CM | POA: Diagnosis not present

## 2016-10-12 DIAGNOSIS — Z9221 Personal history of antineoplastic chemotherapy: Secondary | ICD-10-CM | POA: Diagnosis not present

## 2016-10-12 DIAGNOSIS — R634 Abnormal weight loss: Secondary | ICD-10-CM | POA: Diagnosis not present

## 2016-10-12 DIAGNOSIS — G629 Polyneuropathy, unspecified: Secondary | ICD-10-CM | POA: Diagnosis not present

## 2016-10-12 DIAGNOSIS — R948 Abnormal results of function studies of other organs and systems: Secondary | ICD-10-CM | POA: Diagnosis not present

## 2016-10-12 DIAGNOSIS — R748 Abnormal levels of other serum enzymes: Secondary | ICD-10-CM | POA: Diagnosis not present

## 2016-10-12 DIAGNOSIS — R7989 Other specified abnormal findings of blood chemistry: Secondary | ICD-10-CM

## 2016-10-12 DIAGNOSIS — R197 Diarrhea, unspecified: Secondary | ICD-10-CM | POA: Diagnosis not present

## 2016-10-12 DIAGNOSIS — I252 Old myocardial infarction: Secondary | ICD-10-CM | POA: Diagnosis not present

## 2016-10-12 DIAGNOSIS — F17210 Nicotine dependence, cigarettes, uncomplicated: Secondary | ICD-10-CM | POA: Diagnosis not present

## 2016-10-12 DIAGNOSIS — R945 Abnormal results of liver function studies: Secondary | ICD-10-CM

## 2016-10-12 DIAGNOSIS — Z79811 Long term (current) use of aromatase inhibitors: Secondary | ICD-10-CM | POA: Diagnosis not present

## 2016-10-12 LAB — COMPREHENSIVE METABOLIC PANEL
ALBUMIN: 4.1 g/dL (ref 3.5–5.0)
ALK PHOS: 257 U/L — AB (ref 38–126)
ALT: 227 U/L — AB (ref 14–54)
AST: 199 U/L — ABNORMAL HIGH (ref 15–41)
Anion gap: 9 (ref 5–15)
BILIRUBIN TOTAL: 0.5 mg/dL (ref 0.3–1.2)
BUN: 13 mg/dL (ref 6–20)
CALCIUM: 9.5 mg/dL (ref 8.9–10.3)
CO2: 26 mmol/L (ref 22–32)
CREATININE: 1.01 mg/dL — AB (ref 0.44–1.00)
Chloride: 99 mmol/L — ABNORMAL LOW (ref 101–111)
GFR calc non Af Amer: 58 mL/min — ABNORMAL LOW (ref >60)
GLUCOSE: 74 mg/dL (ref 65–99)
Potassium: 4.8 mmol/L (ref 3.5–5.1)
SODIUM: 134 mmol/L — AB (ref 135–145)
Total Protein: 8 g/dL (ref 6.5–8.1)

## 2016-10-12 NOTE — Telephone Encounter (Signed)
Patient informed to hold neratinib. Teach back process performed. She will come in 1 week and 2 weeks for lab check. She gave verbal understanding to hold her neratinib until further noticed. I explained that md would like to hold the neratinib as her liver functions were elevated.  Pt prefers to come at 330 pm for her lab only apts.  msg sent to sch. Team to arrange for additional lab apts.

## 2016-10-12 NOTE — Telephone Encounter (Signed)
-----   Message from Cammie Sickle, MD sent at 10/12/2016  5:03 PM EST ----- Ask pt to STOP taking her Neratinib pills; as her liver number are UP. Recheck/order LFTs weekly. Dr.B

## 2016-10-19 ENCOUNTER — Inpatient Hospital Stay: Payer: BLUE CROSS/BLUE SHIELD

## 2016-10-19 DIAGNOSIS — C50412 Malignant neoplasm of upper-outer quadrant of left female breast: Secondary | ICD-10-CM

## 2016-10-19 DIAGNOSIS — G629 Polyneuropathy, unspecified: Secondary | ICD-10-CM | POA: Diagnosis not present

## 2016-10-19 DIAGNOSIS — I10 Essential (primary) hypertension: Secondary | ICD-10-CM | POA: Diagnosis not present

## 2016-10-19 DIAGNOSIS — R945 Abnormal results of liver function studies: Secondary | ICD-10-CM

## 2016-10-19 DIAGNOSIS — E785 Hyperlipidemia, unspecified: Secondary | ICD-10-CM | POA: Diagnosis not present

## 2016-10-19 DIAGNOSIS — Z170 Estrogen receptor positive status [ER+]: Principal | ICD-10-CM

## 2016-10-19 DIAGNOSIS — R948 Abnormal results of function studies of other organs and systems: Secondary | ICD-10-CM | POA: Diagnosis not present

## 2016-10-19 DIAGNOSIS — F17210 Nicotine dependence, cigarettes, uncomplicated: Secondary | ICD-10-CM | POA: Diagnosis not present

## 2016-10-19 DIAGNOSIS — Z79811 Long term (current) use of aromatase inhibitors: Secondary | ICD-10-CM | POA: Diagnosis not present

## 2016-10-19 DIAGNOSIS — Z9221 Personal history of antineoplastic chemotherapy: Secondary | ICD-10-CM | POA: Diagnosis not present

## 2016-10-19 DIAGNOSIS — R748 Abnormal levels of other serum enzymes: Secondary | ICD-10-CM | POA: Diagnosis not present

## 2016-10-19 DIAGNOSIS — M8580 Other specified disorders of bone density and structure, unspecified site: Secondary | ICD-10-CM | POA: Diagnosis not present

## 2016-10-19 DIAGNOSIS — R197 Diarrhea, unspecified: Secondary | ICD-10-CM | POA: Diagnosis not present

## 2016-10-19 DIAGNOSIS — R7989 Other specified abnormal findings of blood chemistry: Secondary | ICD-10-CM

## 2016-10-19 DIAGNOSIS — K769 Liver disease, unspecified: Secondary | ICD-10-CM | POA: Diagnosis not present

## 2016-10-19 DIAGNOSIS — Z95828 Presence of other vascular implants and grafts: Secondary | ICD-10-CM

## 2016-10-19 DIAGNOSIS — I252 Old myocardial infarction: Secondary | ICD-10-CM | POA: Diagnosis not present

## 2016-10-19 DIAGNOSIS — R634 Abnormal weight loss: Secondary | ICD-10-CM | POA: Diagnosis not present

## 2016-10-19 DIAGNOSIS — Z7982 Long term (current) use of aspirin: Secondary | ICD-10-CM | POA: Diagnosis not present

## 2016-10-19 LAB — HEPATIC FUNCTION PANEL
ALBUMIN: 4 g/dL (ref 3.5–5.0)
ALK PHOS: 291 U/L — AB (ref 38–126)
ALT: 138 U/L — ABNORMAL HIGH (ref 14–54)
AST: 130 U/L — ABNORMAL HIGH (ref 15–41)
Bilirubin, Direct: 0.2 mg/dL (ref 0.1–0.5)
Indirect Bilirubin: 0.6 mg/dL (ref 0.3–0.9)
Total Bilirubin: 0.8 mg/dL (ref 0.3–1.2)
Total Protein: 7.8 g/dL (ref 6.5–8.1)

## 2016-10-19 MED ORDER — HEPARIN SOD (PORK) LOCK FLUSH 100 UNIT/ML IV SOLN
500.0000 [IU] | Freq: Once | INTRAVENOUS | Status: AC
  Administered 2016-10-19: 500 [IU] via INTRAVENOUS

## 2016-10-19 MED ORDER — SODIUM CHLORIDE 0.9% FLUSH
10.0000 mL | INTRAVENOUS | Status: DC | PRN
  Administered 2016-10-19: 10 mL via INTRAVENOUS
  Filled 2016-10-19: qty 10

## 2016-10-20 ENCOUNTER — Telehealth: Payer: Self-pay | Admitting: Internal Medicine

## 2016-10-20 ENCOUNTER — Other Ambulatory Visit: Payer: Self-pay | Admitting: *Deleted

## 2016-10-20 DIAGNOSIS — R945 Abnormal results of liver function studies: Principal | ICD-10-CM

## 2016-10-20 DIAGNOSIS — R7989 Other specified abnormal findings of blood chemistry: Secondary | ICD-10-CM

## 2016-10-20 NOTE — Telephone Encounter (Signed)
Spoke to pt re:   Patient that LFTs-still high but slightly improved. Continue to recommend Neratinib; also HOLD Cymbalta; Simvastatin.   # repeat LFTs in 1 week; also order acute hepatitis panel. Also schedule US liver ASAP.   # I Informed pt of above plan; She agrees.

## 2016-10-20 NOTE — Telephone Encounter (Signed)
msg sent to scheduling team to arrange u/s. Hep panel added to lab draw for next week.

## 2016-10-21 ENCOUNTER — Other Ambulatory Visit: Payer: Self-pay | Admitting: Internal Medicine

## 2016-10-26 ENCOUNTER — Ambulatory Visit
Admission: RE | Admit: 2016-10-26 | Discharge: 2016-10-26 | Disposition: A | Discharge status: Home / Self Care | Payer: BLUE CROSS/BLUE SHIELD | Source: Ambulatory Visit | Attending: Internal Medicine | Admitting: Internal Medicine

## 2016-10-26 ENCOUNTER — Telehealth: Payer: Self-pay | Admitting: *Deleted

## 2016-10-26 ENCOUNTER — Inpatient Hospital Stay: Payer: BLUE CROSS/BLUE SHIELD

## 2016-10-26 DIAGNOSIS — Z170 Estrogen receptor positive status [ER+]: Principal | ICD-10-CM

## 2016-10-26 DIAGNOSIS — M8580 Other specified disorders of bone density and structure, unspecified site: Secondary | ICD-10-CM | POA: Diagnosis not present

## 2016-10-26 DIAGNOSIS — R948 Abnormal results of function studies of other organs and systems: Secondary | ICD-10-CM | POA: Diagnosis not present

## 2016-10-26 DIAGNOSIS — C50412 Malignant neoplasm of upper-outer quadrant of left female breast: Secondary | ICD-10-CM

## 2016-10-26 DIAGNOSIS — R7989 Other specified abnormal findings of blood chemistry: Secondary | ICD-10-CM | POA: Insufficient documentation

## 2016-10-26 DIAGNOSIS — F17210 Nicotine dependence, cigarettes, uncomplicated: Secondary | ICD-10-CM | POA: Diagnosis not present

## 2016-10-26 DIAGNOSIS — Z9221 Personal history of antineoplastic chemotherapy: Secondary | ICD-10-CM | POA: Diagnosis not present

## 2016-10-26 DIAGNOSIS — R160 Hepatomegaly, not elsewhere classified: Secondary | ICD-10-CM | POA: Insufficient documentation

## 2016-10-26 DIAGNOSIS — G629 Polyneuropathy, unspecified: Secondary | ICD-10-CM | POA: Diagnosis not present

## 2016-10-26 DIAGNOSIS — R634 Abnormal weight loss: Secondary | ICD-10-CM | POA: Diagnosis not present

## 2016-10-26 DIAGNOSIS — I10 Essential (primary) hypertension: Secondary | ICD-10-CM | POA: Diagnosis not present

## 2016-10-26 DIAGNOSIS — R945 Abnormal results of liver function studies: Secondary | ICD-10-CM | POA: Diagnosis not present

## 2016-10-26 DIAGNOSIS — Z79811 Long term (current) use of aromatase inhibitors: Secondary | ICD-10-CM | POA: Diagnosis not present

## 2016-10-26 DIAGNOSIS — R748 Abnormal levels of other serum enzymes: Secondary | ICD-10-CM | POA: Diagnosis not present

## 2016-10-26 DIAGNOSIS — Z7982 Long term (current) use of aspirin: Secondary | ICD-10-CM | POA: Diagnosis not present

## 2016-10-26 DIAGNOSIS — I252 Old myocardial infarction: Secondary | ICD-10-CM | POA: Diagnosis not present

## 2016-10-26 DIAGNOSIS — K769 Liver disease, unspecified: Secondary | ICD-10-CM | POA: Diagnosis not present

## 2016-10-26 DIAGNOSIS — R197 Diarrhea, unspecified: Secondary | ICD-10-CM | POA: Diagnosis not present

## 2016-10-26 DIAGNOSIS — E785 Hyperlipidemia, unspecified: Secondary | ICD-10-CM | POA: Diagnosis not present

## 2016-10-26 LAB — HEPATIC FUNCTION PANEL
ALBUMIN: 3.9 g/dL (ref 3.5–5.0)
ALT: 137 U/L — ABNORMAL HIGH (ref 14–54)
AST: 145 U/L — AB (ref 15–41)
Alkaline Phosphatase: 304 U/L — ABNORMAL HIGH (ref 38–126)
BILIRUBIN TOTAL: 1.1 mg/dL (ref 0.3–1.2)
Bilirubin, Direct: <0.1 mg/dL — ABNORMAL LOW (ref 0.1–0.5)
Total Protein: 7.8 g/dL (ref 6.5–8.1)

## 2016-10-26 NOTE — Telephone Encounter (Signed)
IMPRESSION: Multiple hypoechoic solid masses in the liver consistent with metastatic disease of unknown primary

## 2016-10-26 NOTE — Telephone Encounter (Signed)
Per Dr Rogue Bussing, see patient in [facility] tomorrow with CBC, CMP. Patient agrees to appt at [facility]

## 2016-10-27 ENCOUNTER — Inpatient Hospital Stay (HOSPITAL_BASED_OUTPATIENT_CLINIC_OR_DEPARTMENT_OTHER): Payer: BLUE CROSS/BLUE SHIELD | Admitting: Internal Medicine

## 2016-10-27 ENCOUNTER — Inpatient Hospital Stay: Payer: BLUE CROSS/BLUE SHIELD

## 2016-10-27 VITALS — BP 125/83 | HR 101 | Temp 97.7°F | Wt 156.6 lb

## 2016-10-27 DIAGNOSIS — C50412 Malignant neoplasm of upper-outer quadrant of left female breast: Secondary | ICD-10-CM

## 2016-10-27 DIAGNOSIS — I10 Essential (primary) hypertension: Secondary | ICD-10-CM | POA: Diagnosis not present

## 2016-10-27 DIAGNOSIS — Z9221 Personal history of antineoplastic chemotherapy: Secondary | ICD-10-CM

## 2016-10-27 DIAGNOSIS — Z7982 Long term (current) use of aspirin: Secondary | ICD-10-CM | POA: Diagnosis not present

## 2016-10-27 DIAGNOSIS — E785 Hyperlipidemia, unspecified: Secondary | ICD-10-CM

## 2016-10-27 DIAGNOSIS — R197 Diarrhea, unspecified: Secondary | ICD-10-CM

## 2016-10-27 DIAGNOSIS — Z170 Estrogen receptor positive status [ER+]: Secondary | ICD-10-CM | POA: Diagnosis not present

## 2016-10-27 DIAGNOSIS — R634 Abnormal weight loss: Secondary | ICD-10-CM | POA: Diagnosis not present

## 2016-10-27 DIAGNOSIS — I252 Old myocardial infarction: Secondary | ICD-10-CM | POA: Diagnosis not present

## 2016-10-27 DIAGNOSIS — M8580 Other specified disorders of bone density and structure, unspecified site: Secondary | ICD-10-CM | POA: Diagnosis not present

## 2016-10-27 DIAGNOSIS — Z79811 Long term (current) use of aromatase inhibitors: Secondary | ICD-10-CM

## 2016-10-27 DIAGNOSIS — G629 Polyneuropathy, unspecified: Secondary | ICD-10-CM

## 2016-10-27 DIAGNOSIS — Z95828 Presence of other vascular implants and grafts: Secondary | ICD-10-CM

## 2016-10-27 DIAGNOSIS — K769 Liver disease, unspecified: Secondary | ICD-10-CM

## 2016-10-27 DIAGNOSIS — R948 Abnormal results of function studies of other organs and systems: Secondary | ICD-10-CM

## 2016-10-27 DIAGNOSIS — R748 Abnormal levels of other serum enzymes: Secondary | ICD-10-CM

## 2016-10-27 DIAGNOSIS — F17210 Nicotine dependence, cigarettes, uncomplicated: Secondary | ICD-10-CM

## 2016-10-27 DIAGNOSIS — Z79899 Other long term (current) drug therapy: Secondary | ICD-10-CM

## 2016-10-27 LAB — CBC WITH DIFFERENTIAL/PLATELET
BASOS PCT: 1 %
Basophils Absolute: 0 10*3/uL (ref 0–0.1)
Eosinophils Absolute: 0.2 10*3/uL (ref 0–0.7)
Eosinophils Relative: 3 %
HEMATOCRIT: 43.8 % (ref 35.0–47.0)
HEMOGLOBIN: 14.4 g/dL (ref 12.0–16.0)
Lymphocytes Relative: 29 %
Lymphs Abs: 1.4 10*3/uL (ref 1.0–3.6)
MCH: 30.8 pg (ref 26.0–34.0)
MCHC: 32.8 g/dL (ref 32.0–36.0)
MCV: 93.9 fL (ref 80.0–100.0)
MONOS PCT: 6 %
Monocytes Absolute: 0.3 10*3/uL (ref 0.2–0.9)
NEUTROS ABS: 3 10*3/uL (ref 1.4–6.5)
NEUTROS PCT: 61 %
Platelets: 226 10*3/uL (ref 150–440)
RBC: 4.67 MIL/uL (ref 3.80–5.20)
RDW: 14.3 % (ref 11.5–14.5)
WBC: 4.9 10*3/uL (ref 3.6–11.0)

## 2016-10-27 LAB — COMPREHENSIVE METABOLIC PANEL
ALK PHOS: 284 U/L — AB (ref 38–126)
ALT: 125 U/L — AB (ref 14–54)
ANION GAP: 9 (ref 5–15)
AST: 120 U/L — AB (ref 15–41)
Albumin: 3.8 g/dL (ref 3.5–5.0)
BILIRUBIN TOTAL: 0.7 mg/dL (ref 0.3–1.2)
BUN: 13 mg/dL (ref 6–20)
CALCIUM: 9 mg/dL (ref 8.9–10.3)
CO2: 27 mmol/L (ref 22–32)
CREATININE: 0.93 mg/dL (ref 0.44–1.00)
Chloride: 101 mmol/L (ref 101–111)
GFR calc Af Amer: >60 mL/min (ref >60)
GFR calc non Af Amer: >60 mL/min (ref >60)
GLUCOSE: 93 mg/dL (ref 65–99)
Potassium: 3.6 mmol/L (ref 3.5–5.1)
Sodium: 137 mmol/L (ref 135–145)
TOTAL PROTEIN: 7.9 g/dL (ref 6.5–8.1)

## 2016-10-27 LAB — PROTIME-INR
INR: 1
Prothrombin Time: 13.2 seconds (ref 11.4–15.2)

## 2016-10-27 LAB — APTT: APTT: 61 s — AB (ref 24–36)

## 2016-10-27 LAB — HEPATITIS PANEL, ACUTE
HCV Ab: <0.1 s/co ratio (ref 0.0–0.9)
Hep A IgM: NEGATIVE
Hep B C IgM: NEGATIVE
Hepatitis B Surface Ag: NEGATIVE

## 2016-10-27 MED ORDER — SODIUM CHLORIDE 0.9% FLUSH
10.0000 mL | INTRAVENOUS | Status: DC | PRN
  Administered 2016-10-27: 10 mL via INTRAVENOUS
  Filled 2016-10-27: qty 10

## 2016-10-27 MED ORDER — HEPARIN SOD (PORK) LOCK FLUSH 100 UNIT/ML IV SOLN
INTRAVENOUS | Status: AC
  Filled 2016-10-27: qty 5

## 2016-10-27 MED ORDER — HEPARIN SOD (PORK) LOCK FLUSH 100 UNIT/ML IV SOLN
500.0000 [IU] | Freq: Once | INTRAVENOUS | Status: AC
Start: 2016-10-27 — End: 2016-10-27
  Administered 2016-10-27: 500 [IU] via INTRAVENOUS

## 2016-10-27 NOTE — Progress Notes (Signed)
Patient here today for follow up.   

## 2016-10-27 NOTE — Assessment & Plan Note (Addendum)
Stage II breast cancer-ER/PR positive HER-2/neu positive. Currently s/p adjuvant Herceptin. On Anastrazole. Patient also on Neratinib; this is currently on hold because of elevated LFTs [C discussion below]. Concerns for recurrence/C discussion below.   # Elevated LFTs- AST ALT in 200 range unclear etiology. Liver ultrasound shows multiple liver lesions up to 4 cm in size concerning for metastatic disease. Patient had previous liver biopsy in 2016 for suspicion lesion negative for malignancy. I reviewed the ultrasound images myself and with Dr.Henn. Recommend a CT scan with contrast of the chest abdomen pelvis; an ultrasound-guided biopsy based upon the results/suspicion of the liver lesions. Discussed regarding the biopsy patient agrees to have one if needed. Given the concerns for recurrence- check tumor markers today PT/PTT.  #  Continue to hold Neratinib; simvastatin; cymbalta- given the elevated LFTs.  # Follow-up with me in 10 days/tentative-LFTs. [[phone]/ cell].

## 2016-10-27 NOTE — Progress Notes (Signed)
[place] OFFICE PROGRESS NOTE  Patient Care Team: [name], MD as PCP - General (Family Medicine) [name], MD (General Surgery) [name], MD (Inactive) as Attending Physician (Internal Medicine)  No matching staging information was found for the patient.   Oncology History   # LEFT BREAST IDC; STAGE II [cT2N1] ER >90%; PR- 50-90%; her 2 NEU- POS; s/p Neoadj chemo; AUG 2016- TCH+P s/p Lumpec & partial ALND- path CR; s/p RT [finished Nov 2016];  adj Herceptin; HELD for Jan 19th 2017 [in DEC 2016-EF dropped from 63 to 51%; FEB 27th EF-42.9%]; JAN 2016 START Arimidex; May 2017- EF- 60%; May 24th 2017-Re-start Herceptin q 3W; July 28th STOP herceptin [finished 58m/m2]  # DEC 4th 2017- Start Neratinib 4 pills; DEC 11th 3 pills  # PN- G-2; may 2017- Start cymbalta 663md  # April 2016- Liver Bx- NEG;   # Drop in EF from Herceptin [recovered OCt 27th- EF 65%]  # BMD- jan 2017- osteopenia     Carcinoma of upper-outer quadrant of left breast in female, estrogen receptor positive ([facility]  12/27/2014 Initial Diagnosis    Breast cancer of upper-outer quadrant of left female breast ([facility]       INTERVAL HISTORY:  A very pleasant [age]ear old female patient with above history of stage II ER/PR positive HER-2/neu positive breast cancer currently Arimidex is here for follow-up; Patient recently started on Neratinib- approximately 2 month ago.  However Neratinib- is on hold because of elevated LFTs. Patient is also here to review the results of the ultrasound that was ordered for elevated LFTs.  Diarrhea is improved. No nausea no vomiting. Appetite is good. No significant weight loss. Denies any headaches.  Patient denies any shortness of breath. Denies any chest pain. Denies any swelling in the legs. No headaches vision changes or double vision.    REVIEW OF SYSTEMS:  A complete 10 point review of system is done which is negative except mentioned  above/history of present illness.   PAST MEDICAL HISTORY :  Past Medical History:  Diagnosis Date  . Breast cancer ([facility]   Left-radiation  . Cancer ([facility]   Stage II, ER positive, PR positive, HER-2/neu overexpressing of the left breast.  . Chemotherapy-induced peripheral neuropathy ([facility]  . Heart attack 1998  . Hyperlipidemia   . Hypertension   . Neuromuscular disorder (HCC)    neuropathy  . [place]uropathy ([facility] [place]    PAST SURGICAL HISTORY :   Past Surgical History:  Procedure Laterality Date  . BREAST BIOPSY Left 2016   Positive  . BREAST LUMPECTOMY WITH SENTINEL LYMPH NODE BIOPSY Left 05/23/2015   Procedure: LEFT BREAST WIDE EXCISION WITH AXILLARY DISSECTION, MASTOPLASTY ;  Surgeon: JeRobert BellowMD;  Location: [facility] ORS;  Service: General;  Laterality: Left;  . BREAST SURGERY Left 12/18/14   breast biopsy/INVASIVE DUCTAL CARCINOMA OF BREAST, NOTTINGHAM GRADE 2.  . Marland KitchenREAST SURGERY  05/23/2015.   Wide excision/mastoplasty, axillary dissection. No residual invasive cancer, positive for residual DCIS. 0/2 nodes identified on axillary dissection. (no SLN by technetium or methylene blue)  . CARDIAC CATHETERIZATION    . PORTACATH PLACEMENT Right 12-31-14   Dr ByBary Castilla  FAMILY HISTORY :   Family History  Problem Relation Age of Onset  . Breast cancer Maternal Aunt   . Breast cancer Cousin   . Brain cancer Maternal Uncle     SOCIAL HISTORY:   Social History  Substance Use Topics  .  Smoking status: Current Every Day Smoker    Packs/day: 0.50    Years: 18.00    Types: Cigarettes  . Smokeless tobacco: Never Used  . Alcohol use No    ALLERGIES:  is allergic to no known allergies.  MEDICATIONS:  Current Outpatient Prescriptions  Medication Sig Dispense Refill  . anastrozole (ARIMIDEX) 1 MG tablet Take 1 tablet (1 mg total) by mouth daily. 90 tablet 3  . aspirin 81 MG tablet Take 81 mg by mouth daily.    . carvedilol (COREG) 6.25 MG tablet Take 1 tablet (6.25 mg  total) by mouth 2 (two) times daily. 180 tablet 0  . diphenoxylate-atropine (LOMOTIL) 2.5-0.025 MG tablet Take 1 tablet by mouth 4 (four) times daily as needed for diarrhea or loose stools. Take it along with immodium 60 tablet 0  . lisinopril (PRINIVIL,ZESTRIL) 10 MG tablet TAKE 1 TABLET DAILY 90 tablet 0  . potassium chloride SA (K-DUR,KLOR-CON) 20 MEQ tablet 1 pill twice a day 30 tablet 3  . DULoxetine (CYMBALTA) 60 MG capsule TAKE 1 CAPSULE (60 MG TOTAL) BY MOUTH DAILY. (Patient not taking: Reported on 10/27/2016) 30 capsule 0   No current facility-administered medications for this visit.    Facility-Administered Medications Ordered in Other Visits  Medication Dose Route Frequency Provider Last Rate Last Dose  . 0.9 %  sodium chloride infusion   Intravenous Continuous [name], MD      . 0.9 %  sodium chloride infusion   Intravenous Continuous Lloyd Huger, MD 999 mL/hr at 04/24/15 1520    . heparin lock flush 100 unit/mL  500 Units Intravenous Once Dmitriy Berenzon, MD      . sodium chloride 0.9 % injection 10 mL  10 mL Intravenous PRN [name], MD   10 mL at 04/04/15 1440  . sodium chloride flush (NS) 0.9 % injection 10 mL  10 mL Intravenous PRN Dmitriy Berenzon, MD        PHYSICAL EXAMINATION: ECOG PERFORMANCE STATUS: 0 - Asymptomatic  BP 125/83 (BP Location: Right Arm, Patient Position: Sitting)   Pulse (!) 101   Temp 97.7 F (36.5 C) (Tympanic)   Wt 156 lb 10.2 oz (71.1 kg)   BMI 25.28 kg/m   Filed Weights   10/27/16 1158  Weight: 156 lb 10.2 oz (71.1 kg)    GENERAL: Well-nourished well-developed; Alert, no distress and comfortable.  Alone. EYES: no pallor or icterus OROPHARYNX: no thrush or ulceration; good dentition  NECK: supple, no masses felt LYMPH:  no palpable lymphadenopathy in the cervical, axillary or inguinal regions LUNGS: clear to auscultation and  No wheeze or crackles HEART/CVS: regular rate & rhythm and no murmurs; No lower extremity  edema ABDOMEN:abdomen soft, non-tender and normal bowel sounds Musculoskeletal:no cyanosis of digits and no clubbing  PSYCH: alert & oriented x 3 with fluent speech NEURO: no focal motor/sensory deficits   LABORATORY DATA:  I have reviewed the data as listed    Component Value Date/Time   NA 137 10/27/2016 1138   NA 135 01/08/2015 0850   K 3.6 10/27/2016 1138   K 3.7 01/08/2015 0850   CL 101 10/27/2016 1138   CL 101 01/08/2015 0850   CO2 27 10/27/2016 1138   CO2 26 01/08/2015 0850   GLUCOSE 93 10/27/2016 1138   GLUCOSE 161 (H) 01/08/2015 0850   BUN 13 10/27/2016 1138   BUN 17 01/08/2015 0850   CREATININE 0.93 10/27/2016 1138   CREATININE 0.82 01/22/2015 1559   CALCIUM 9.0 10/27/2016  1138   CALCIUM 9.3 01/08/2015 0850   PROT 7.9 10/27/2016 1138   PROT 6.8 01/22/2015 1559   ALBUMIN 3.8 10/27/2016 1138   ALBUMIN 3.9 01/22/2015 1559   AST 120 (H) 10/27/2016 1138   AST 34 01/22/2015 1559   ALT 125 (H) 10/27/2016 1138   ALT 42 01/22/2015 1559   ALKPHOS 284 (H) 10/27/2016 1138   ALKPHOS 157 (H) 01/22/2015 1559   BILITOT 0.7 10/27/2016 1138   BILITOT 0.2 (L) 01/22/2015 1559   GFRNONAA >60 10/27/2016 1138   GFRNONAA >60 01/22/2015 1559   GFRAA >60 10/27/2016 1138   GFRAA >60 01/22/2015 1559    No results found for: SPEP, UPEP  Lab Results  Component Value Date   WBC 4.9 10/27/2016   NEUTROABS 3.0 10/27/2016   HGB 14.4 10/27/2016   HCT 43.8 10/27/2016   MCV 93.9 10/27/2016   PLT 226 10/27/2016      Chemistry      Component Value Date/Time   NA 137 10/27/2016 1138   NA 135 01/08/2015 0850   K 3.6 10/27/2016 1138   K 3.7 01/08/2015 0850   CL 101 10/27/2016 1138   CL 101 01/08/2015 0850   CO2 27 10/27/2016 1138   CO2 26 01/08/2015 0850   BUN 13 10/27/2016 1138   BUN 17 01/08/2015 0850   CREATININE 0.93 10/27/2016 1138   CREATININE 0.82 01/22/2015 1559      Component Value Date/Time   CALCIUM 9.0 10/27/2016 1138   CALCIUM 9.3 01/08/2015 0850   ALKPHOS 284  (H) 10/27/2016 1138   ALKPHOS 157 (H) 01/22/2015 1559   AST 120 (H) 10/27/2016 1138   AST 34 01/22/2015 1559   ALT 125 (H) 10/27/2016 1138   ALT 42 01/22/2015 1559   BILITOT 0.7 10/27/2016 1138   BILITOT 0.2 (L) 01/22/2015 1559            LABORATORY DATA:  I have reviewed the data as listed    Component Value Date/Time   NA 137 10/27/2016 1138   NA 135 01/08/2015 0850   K 3.6 10/27/2016 1138   K 3.7 01/08/2015 0850   CL 101 10/27/2016 1138   CL 101 01/08/2015 0850   CO2 27 10/27/2016 1138   CO2 26 01/08/2015 0850   GLUCOSE 93 10/27/2016 1138   GLUCOSE 161 (H) 01/08/2015 0850   BUN 13 10/27/2016 1138   BUN 17 01/08/2015 0850   CREATININE 0.93 10/27/2016 1138   CREATININE 0.82 01/22/2015 1559   CALCIUM 9.0 10/27/2016 1138   CALCIUM 9.3 01/08/2015 0850   PROT 7.9 10/27/2016 1138   PROT 6.8 01/22/2015 1559   ALBUMIN 3.8 10/27/2016 1138   ALBUMIN 3.9 01/22/2015 1559   AST 120 (H) 10/27/2016 1138   AST 34 01/22/2015 1559   ALT 125 (H) 10/27/2016 1138   ALT 42 01/22/2015 1559   ALKPHOS 284 (H) 10/27/2016 1138   ALKPHOS 157 (H) 01/22/2015 1559   BILITOT 0.7 10/27/2016 1138   BILITOT 0.2 (L) 01/22/2015 1559   GFRNONAA >60 10/27/2016 1138   GFRNONAA >60 01/22/2015 1559   GFRAA >60 10/27/2016 1138   GFRAA >60 01/22/2015 1559    No results found for: SPEP, UPEP  Lab Results  Component Value Date   WBC 4.9 10/27/2016   NEUTROABS 3.0 10/27/2016   HGB 14.4 10/27/2016   HCT 43.8 10/27/2016   MCV 93.9 10/27/2016   PLT 226 10/27/2016      Chemistry      Component Value Date/Time  NA 137 10/27/2016 1138   NA 135 01/08/2015 0850   K 3.6 10/27/2016 1138   K 3.7 01/08/2015 0850   CL 101 10/27/2016 1138   CL 101 01/08/2015 0850   CO2 27 10/27/2016 1138   CO2 26 01/08/2015 0850   BUN 13 10/27/2016 1138   BUN 17 01/08/2015 0850   CREATININE 0.93 10/27/2016 1138   CREATININE 0.82 01/22/2015 1559      Component Value Date/Time   CALCIUM 9.0 10/27/2016 1138    CALCIUM 9.3 01/08/2015 0850   ALKPHOS 284 (H) 10/27/2016 1138   ALKPHOS 157 (H) 01/22/2015 1559   AST 120 (H) 10/27/2016 1138   AST 34 01/22/2015 1559   ALT 125 (H) 10/27/2016 1138   ALT 42 01/22/2015 1559   BILITOT 0.7 10/27/2016 1138   BILITOT 0.2 (L) 01/22/2015 1559     IMPRESSION: Normal left ventricular wall motion with estimated ejection fraction of 65%.   Electronically Signed   By: Marijo Sanes M.D.   On: 07/27/2016 09:04  RADIOGRAPHIC STUDIES: I have personally reviewed the radiological images as listed and agreed with the findings in the report. US Abdomen Complete  Result Date: 10/26/2016 CLINICAL DATA:  Elevated liver function tests. EXAM: ABDOMEN ULTRASOUND COMPLETE COMPARISON:  Ultrasound dated 03/24/2007 FINDINGS: Gallbladder: No gallstones or wall thickening visualized. No sonographic Murphy sign noted by sonographer. Common bile duct: Diameter: 3.4 mm, normal. Liver: There are multiple solid slightly hypoechoic masses in the liver with the largest being 4 cm in diameter in the anterior aspect of the right lobe. No dilated intrahepatic bile ducts. IVC: No abnormality visualized. Pancreas: Visualized portion unremarkable. Spleen: Size and appearance within normal limits. Right Kidney: Length: 9.4 cm. Echogenicity within normal limits. No mass or hydronephrosis visualized. Left Kidney: Length: 10.7 cm. Echogenicity within normal limits. No mass or hydronephrosis visualized. Abdominal aorta: No aneurysm visualized.  Maximum diameter 2.4 cm. Other findings: None. IMPRESSION: Multiple hypoechoic solid masses in the liver consistent with metastatic disease of unknown primary. Electronically Signed   By: Lorriane Shire M.D.   On: 10/26/2016 09:52     ASSESSMENT & PLAN:  Carcinoma of upper-outer quadrant of left breast in female, estrogen receptor positive ([place]) Stage II breast cancer-ER/PR positive HER-2/neu positive. Currently s/p adjuvant Herceptin. On Anastrazole. Patient  also on Neratinib; this is currently on hold because of elevated LFTs [C discussion below]. Concerns for recurrence/C discussion below.   # Elevated LFTs- AST ALT in 200 range unclear etiology. Liver ultrasound shows multiple liver lesions up to 4 cm in size concerning for metastatic disease. Patient had previous liver biopsy in 2016 for suspicion lesion negative for malignancy. I reviewed the ultrasound images myself and with Dr.Henn. Recommend a CT scan with contrast of the chest abdomen pelvis; an ultrasound-guided biopsy based upon the results/suspicion of the liver lesions. Discussed regarding the biopsy patient agrees to have one if needed. Given the concerns for recurrence- check tumor markers today PT/PTT.  #  Continue to hold Neratinib; simvastatin; cymbalta- given the elevated LFTs.  # Follow-up with me in 10 days/tentative-LFTs. [[phone]/ [id number]].    Orders Placed This Encounter  Procedures  . CT ABDOMEN PELVIS W CONTRAST    Standing Status:   Future    Standing Expiration Date:   01/26/2018    Order Specific Question:   Reason for Exam (SYMPTOM  OR DIAGNOSIS REQUIRED)    Answer:   breast cancer; liver lesions    Order Specific Question:   Preferred imaging location?  Answer:   [facility]  . CT CHEST W CONTRAST    Standing Status:   Future    Standing Expiration Date:   12/27/2017    Order Specific Question:   Reason for Exam (SYMPTOM  OR DIAGNOSIS REQUIRED)    Answer:   breast cancer; liver lesions    Order Specific Question:   Preferred imaging location?    Answer:   [facility]  . US Guided Needle Placement    Standing Status:   Future    Standing Expiration Date:   12/25/2017    Order Specific Question:   Reason for Exam (SYMPTOM  OR DIAGNOSIS REQUIRED)    Answer:   hypoechoic solid masses in the liver- h/o breast cancer    Order Specific Question:   Preferred imaging location?    Answer:   [facility]  . Cancer antigen 15-3    Standing Status:    Future    Number of Occurrences:   1    Standing Expiration Date:   12/01/2017  . Cancer antigen 27.29    Standing Status:   Future    Number of Occurrences:   1    Standing Expiration Date:   12/01/2017  . CEA    Standing Status:   Future    Number of Occurrences:   1    Standing Expiration Date:   12/01/2017  . APTT    Standing Status:   Future    Number of Occurrences:   1    Standing Expiration Date:   10/27/2017  . Protime-INR    Standing Status:   Future    Number of Occurrences:   1    Standing Expiration Date:   10/27/2017   All questions were answered. The patient knows to call the clinic with any problems, questions or concerns.      Cammie Sickle, MD 10/27/2016 1:49 PM

## 2016-10-28 ENCOUNTER — Telehealth: Payer: Self-pay | Admitting: *Deleted

## 2016-10-28 DIAGNOSIS — Z170 Estrogen receptor positive status [ER+]: Principal | ICD-10-CM

## 2016-10-28 DIAGNOSIS — C50412 Malignant neoplasm of upper-outer quadrant of left female breast: Secondary | ICD-10-CM

## 2016-10-28 LAB — CANCER ANTIGEN 27.29: CA 27.29: 490.1 U/mL — ABNORMAL HIGH (ref 0.0–38.6)

## 2016-10-28 LAB — CEA: CEA: 4 ng/mL (ref 0.0–4.7)

## 2016-10-28 LAB — CANCER ANTIGEN 15-3: CA 15-3: 346.5 U/mL — ABNORMAL HIGH (ref 0.0–25.0)

## 2016-10-28 MED ORDER — POTASSIUM CHLORIDE CRYS ER 20 MEQ PO TBCR: EXTENDED_RELEASE_TABLET | ORAL | 3 refills | Status: DC

## 2016-10-28 NOTE — Telephone Encounter (Signed)
-----   Message from Cephus Richer sent at 10/28/2016  8:42 AM EST ----- Contact: [phone] Pt only need a 7 day supply of Potassium . She is waiting on her mail order.

## 2016-10-29 ENCOUNTER — Other Ambulatory Visit: Payer: Self-pay | Admitting: Internal Medicine

## 2016-10-29 ENCOUNTER — Telehealth: Payer: Self-pay | Admitting: Internal Medicine

## 2016-10-29 DIAGNOSIS — D689 Coagulation defect, unspecified: Secondary | ICD-10-CM

## 2016-10-29 NOTE — Telephone Encounter (Signed)
Call attempt to patient. Left vm that Dr. Rogue Bussing would like to order additional lab studies for pt. I asked pt to give [place] a call.

## 2016-10-29 NOTE — Telephone Encounter (Signed)
Please inform pt that I would like to run few labs asap to find out more about her cancer status- please order guardiant 360; also repeat  Pt/ptt; mixing study [i have ordered these tests] asap.

## 2016-10-30 ENCOUNTER — Inpatient Hospital Stay: Payer: BLUE CROSS/BLUE SHIELD | Attending: Internal Medicine

## 2016-10-30 DIAGNOSIS — I252 Old myocardial infarction: Secondary | ICD-10-CM | POA: Insufficient documentation

## 2016-10-30 DIAGNOSIS — Z803 Family history of malignant neoplasm of breast: Secondary | ICD-10-CM | POA: Diagnosis not present

## 2016-10-30 DIAGNOSIS — G629 Polyneuropathy, unspecified: Secondary | ICD-10-CM | POA: Insufficient documentation

## 2016-10-30 DIAGNOSIS — Z79899 Other long term (current) drug therapy: Secondary | ICD-10-CM | POA: Insufficient documentation

## 2016-10-30 DIAGNOSIS — Z170 Estrogen receptor positive status [ER+]: Secondary | ICD-10-CM | POA: Insufficient documentation

## 2016-10-30 DIAGNOSIS — I10 Essential (primary) hypertension: Secondary | ICD-10-CM | POA: Insufficient documentation

## 2016-10-30 DIAGNOSIS — C50412 Malignant neoplasm of upper-outer quadrant of left female breast: Secondary | ICD-10-CM | POA: Insufficient documentation

## 2016-10-30 DIAGNOSIS — Z9221 Personal history of antineoplastic chemotherapy: Secondary | ICD-10-CM | POA: Diagnosis not present

## 2016-10-30 DIAGNOSIS — Z7982 Long term (current) use of aspirin: Secondary | ICD-10-CM | POA: Insufficient documentation

## 2016-10-30 DIAGNOSIS — M8580 Other specified disorders of bone density and structure, unspecified site: Secondary | ICD-10-CM | POA: Insufficient documentation

## 2016-10-30 DIAGNOSIS — D689 Coagulation defect, unspecified: Secondary | ICD-10-CM

## 2016-10-30 DIAGNOSIS — R948 Abnormal results of function studies of other organs and systems: Secondary | ICD-10-CM | POA: Diagnosis not present

## 2016-10-30 DIAGNOSIS — F17210 Nicotine dependence, cigarettes, uncomplicated: Secondary | ICD-10-CM | POA: Insufficient documentation

## 2016-10-30 DIAGNOSIS — Z923 Personal history of irradiation: Secondary | ICD-10-CM | POA: Diagnosis not present

## 2016-10-30 DIAGNOSIS — E785 Hyperlipidemia, unspecified: Secondary | ICD-10-CM | POA: Diagnosis not present

## 2016-10-30 DIAGNOSIS — C787 Secondary malignant neoplasm of liver and intrahepatic bile duct: Secondary | ICD-10-CM | POA: Insufficient documentation

## 2016-10-30 DIAGNOSIS — C7931 Secondary malignant neoplasm of brain: Secondary | ICD-10-CM | POA: Diagnosis not present

## 2016-10-30 DIAGNOSIS — Z808 Family history of malignant neoplasm of other organs or systems: Secondary | ICD-10-CM | POA: Diagnosis not present

## 2016-10-30 LAB — PROTIME-INR
INR: 1.06
PROTHROMBIN TIME: 13.8 s (ref 11.4–15.2)

## 2016-10-30 LAB — CBC WITH DIFFERENTIAL/PLATELET
BASOS ABS: 0 10*3/uL (ref 0–0.1)
Basophils Relative: 1 %
Eosinophils Absolute: 0.2 10*3/uL (ref 0–0.7)
Eosinophils Relative: 4 %
HEMATOCRIT: 40.2 % (ref 35.0–47.0)
Hemoglobin: 13.5 g/dL (ref 12.0–16.0)
Lymphocytes Relative: 31 %
Lymphs Abs: 1.6 10*3/uL (ref 1.0–3.6)
MCH: 31.4 pg (ref 26.0–34.0)
MCHC: 33.7 g/dL (ref 32.0–36.0)
MCV: 93 fL (ref 80.0–100.0)
Monocytes Absolute: 0.2 10*3/uL (ref 0.2–0.9)
Monocytes Relative: 5 %
NEUTROS ABS: 3.1 10*3/uL (ref 1.4–6.5)
Neutrophils Relative %: 59 %
PLATELETS: 231 10*3/uL (ref 150–440)
RBC: 4.32 MIL/uL (ref 3.80–5.20)
RDW: 14.7 % — ABNORMAL HIGH (ref 11.5–14.5)
WBC: 5.2 10*3/uL (ref 3.6–11.0)

## 2016-10-30 LAB — COMPREHENSIVE METABOLIC PANEL
ALBUMIN: 3.8 g/dL (ref 3.5–5.0)
ALT: 128 U/L — AB (ref 14–54)
AST: 133 U/L — AB (ref 15–41)
Alkaline Phosphatase: 284 U/L — ABNORMAL HIGH (ref 38–126)
Anion gap: 10 (ref 5–15)
BILIRUBIN TOTAL: 0.9 mg/dL (ref 0.3–1.2)
BUN: 17 mg/dL (ref 6–20)
CHLORIDE: 101 mmol/L (ref 101–111)
CO2: 25 mmol/L (ref 22–32)
CREATININE: 0.88 mg/dL (ref 0.44–1.00)
Calcium: 9 mg/dL (ref 8.9–10.3)
GFR calc Af Amer: >60 mL/min (ref >60)
GLUCOSE: 87 mg/dL (ref 65–99)
Potassium: 3.5 mmol/L (ref 3.5–5.1)
Sodium: 136 mmol/L (ref 135–145)
Total Protein: 7.6 g/dL (ref 6.5–8.1)

## 2016-10-30 LAB — APTT: aPTT: 32 seconds (ref 24–36)

## 2016-10-30 NOTE — Telephone Encounter (Signed)
Patient returned my phone call. I spoke with the patient about this. Explained the need for gaurdant 360 testing and ptt/ mixing studies. I explained that her pt/inr came back elevated. md would like to screen patient for a coagulation study to determine why the pt/inr is abnormal and also determine if it is safe to proceed with u/s bx of liver. She gave verbal understanding of the plan and would like to come to [facility] today for her lab testing at 330pm.

## 2016-10-31 LAB — PTT FACTOR INHIBITOR (MIXING STUDY)
aPTT 1:1 Mix Saline: 31.4 s
aPTT 1:1 Normal Plasma: 23.4 s (ref 22.9–30.2)
aPTT: 24.2 s (ref 22.9–30.2)

## 2016-11-02 ENCOUNTER — Telehealth: Payer: Self-pay | Admitting: Internal Medicine

## 2016-11-02 ENCOUNTER — Other Ambulatory Visit: Payer: Self-pay | Admitting: *Deleted

## 2016-11-02 ENCOUNTER — Other Ambulatory Visit: Payer: BLUE CROSS/BLUE SHIELD

## 2016-11-02 ENCOUNTER — Telehealth: Payer: Self-pay | Admitting: *Deleted

## 2016-11-02 ENCOUNTER — Ambulatory Visit
Admission: RE | Admit: 2016-11-02 | Discharge: 2016-11-02 | Disposition: A | Discharge status: Home / Self Care | Payer: BLUE CROSS/BLUE SHIELD | Source: Ambulatory Visit | Attending: Internal Medicine | Admitting: Internal Medicine

## 2016-11-02 ENCOUNTER — Ambulatory Visit: Payer: BLUE CROSS/BLUE SHIELD | Admitting: Internal Medicine

## 2016-11-02 DIAGNOSIS — K769 Liver disease, unspecified: Secondary | ICD-10-CM | POA: Diagnosis not present

## 2016-11-02 DIAGNOSIS — R911 Solitary pulmonary nodule: Secondary | ICD-10-CM | POA: Insufficient documentation

## 2016-11-02 DIAGNOSIS — C787 Secondary malignant neoplasm of liver and intrahepatic bile duct: Secondary | ICD-10-CM | POA: Diagnosis not present

## 2016-11-02 DIAGNOSIS — Z170 Estrogen receptor positive status [ER+]: Secondary | ICD-10-CM | POA: Diagnosis not present

## 2016-11-02 DIAGNOSIS — M899 Disorder of bone, unspecified: Secondary | ICD-10-CM | POA: Insufficient documentation

## 2016-11-02 DIAGNOSIS — C50412 Malignant neoplasm of upper-outer quadrant of left female breast: Secondary | ICD-10-CM

## 2016-11-02 DIAGNOSIS — R918 Other nonspecific abnormal finding of lung field: Secondary | ICD-10-CM | POA: Diagnosis not present

## 2016-11-02 MED ORDER — IOPAMIDOL (ISOVUE-370) INJECTION 76%
125.0000 mL | Freq: Once | INTRAVENOUS | Status: AC | PRN
  Administered 2016-11-02: 125 mL via INTRAVENOUS

## 2016-11-02 NOTE — Progress Notes (Signed)
Contacted Barbara in scheduling to set up stat liver biopsy

## 2016-11-02 NOTE — Telephone Encounter (Signed)
Spoke to pt re: results of Ct scan- recommend [place] Bx of liver; follow up in few days after the Bx.

## 2016-11-02 NOTE — Telephone Encounter (Signed)
Left msg for patient asking pt to return my phone call. She did not sign the guardant 360 pt consent form. [employer] needs her consent to run the specimen. Asked pt if she could come to [facility] clinic to sign papers.   Patient returned phone call left msg for RN-asking if RN could fax papers to [facility] clinic. Pt most likely could go to [place] by 330pm today to sign.   Spoke with [place] in [place]. She will get form off of fax and personally give it to one of the nurses in cancer center to handle.  Pt will just need to sign the form and I can fax it it [employer] in the morning.  I contacted pt back and asked her if she could come by 330pm today - left vm for patient.  I also called and spoke to Lanetta Inch, RN to let her know pt will be coming this afternoon to complete form.

## 2016-11-04 ENCOUNTER — Other Ambulatory Visit: Payer: Self-pay | Admitting: Internal Medicine

## 2016-11-04 ENCOUNTER — Other Ambulatory Visit: Payer: Self-pay | Admitting: Radiology

## 2016-11-04 ENCOUNTER — Ambulatory Visit: Payer: BLUE CROSS/BLUE SHIELD

## 2016-11-04 DIAGNOSIS — C50912 Malignant neoplasm of unspecified site of left female breast: Secondary | ICD-10-CM

## 2016-11-04 DIAGNOSIS — M810 Age-related osteoporosis without current pathological fracture: Secondary | ICD-10-CM

## 2016-11-04 DIAGNOSIS — C50312 Malignant neoplasm of lower-inner quadrant of left female breast: Secondary | ICD-10-CM

## 2016-11-05 ENCOUNTER — Other Ambulatory Visit: Payer: Self-pay | Admitting: General Surgery

## 2016-11-06 ENCOUNTER — Ambulatory Visit
Admission: RE | Admit: 2016-11-06 | Discharge: 2016-11-06 | Disposition: A | Discharge status: Home / Self Care | Payer: BLUE CROSS/BLUE SHIELD | Source: Ambulatory Visit | Attending: Internal Medicine | Admitting: Internal Medicine

## 2016-11-06 DIAGNOSIS — Z79899 Other long term (current) drug therapy: Secondary | ICD-10-CM | POA: Insufficient documentation

## 2016-11-06 DIAGNOSIS — C50412 Malignant neoplasm of upper-outer quadrant of left female breast: Secondary | ICD-10-CM | POA: Insufficient documentation

## 2016-11-06 DIAGNOSIS — Z7982 Long term (current) use of aspirin: Secondary | ICD-10-CM | POA: Insufficient documentation

## 2016-11-06 DIAGNOSIS — F17210 Nicotine dependence, cigarettes, uncomplicated: Secondary | ICD-10-CM | POA: Insufficient documentation

## 2016-11-06 DIAGNOSIS — Z170 Estrogen receptor positive status [ER+]: Secondary | ICD-10-CM | POA: Insufficient documentation

## 2016-11-06 DIAGNOSIS — C787 Secondary malignant neoplasm of liver and intrahepatic bile duct: Secondary | ICD-10-CM | POA: Insufficient documentation

## 2016-11-06 DIAGNOSIS — K769 Liver disease, unspecified: Secondary | ICD-10-CM | POA: Diagnosis present

## 2016-11-06 DIAGNOSIS — K7689 Other specified diseases of liver: Secondary | ICD-10-CM | POA: Diagnosis not present

## 2016-11-06 LAB — CBC
HCT: 43.9 % (ref 35.0–47.0)
Hemoglobin: 15.3 g/dL (ref 12.0–16.0)
MCH: 32.1 pg (ref 26.0–34.0)
MCHC: 34.7 g/dL (ref 32.0–36.0)
MCV: 92.3 fL (ref 80.0–100.0)
PLATELETS: 257 10*3/uL (ref 150–440)
RBC: 4.76 MIL/uL (ref 3.80–5.20)
RDW: 15 % — AB (ref 11.5–14.5)
WBC: 4.7 10*3/uL (ref 3.6–11.0)

## 2016-11-06 LAB — PROTIME-INR
INR: 0.95
PROTHROMBIN TIME: 12.7 s (ref 11.4–15.2)

## 2016-11-06 LAB — APTT: APTT: 30 s (ref 24–36)

## 2016-11-06 MED ORDER — MIDAZOLAM HCL 5 MG/5ML IJ SOLN
INTRAMUSCULAR | Status: AC | PRN
  Administered 2016-11-06: 0.5 mg via INTRAVENOUS
  Administered 2016-11-06: 1 mg via INTRAVENOUS

## 2016-11-06 MED ORDER — SODIUM CHLORIDE 0.9 % IV SOLN
INTRAVENOUS | Status: DC
  Administered 2016-11-06: 11:00:00 via INTRAVENOUS

## 2016-11-06 MED ORDER — ONDANSETRON HCL 4 MG/2ML IJ SOLN
4.0000 mg | Freq: Once | INTRAMUSCULAR | Status: AC
  Administered 2016-11-06: 4 mg via INTRAVENOUS
  Filled 2016-11-06: qty 2

## 2016-11-06 MED ORDER — OXYCODONE HCL 5 MG PO TABS
5.0000 mg | ORAL_TABLET | ORAL | Status: DC | PRN
  Filled 2016-11-06: qty 1

## 2016-11-06 MED ORDER — FENTANYL CITRATE (PF) 100 MCG/2ML IJ SOLN
INTRAMUSCULAR | Status: AC | PRN
  Administered 2016-11-06: 50 ug via INTRAVENOUS

## 2016-11-06 MED ORDER — ONDANSETRON HCL 4 MG/2ML IJ SOLN
INTRAMUSCULAR | Status: AC
  Filled 2016-11-06: qty 2

## 2016-11-06 NOTE — Progress Notes (Signed)
1455-pt. Became hot and diaphoretic suddenly.  N/V.  Procedure site remains soft and non-tender.  BP dropped to 96 73 and HR down to 70's. Dr. Anselm Pancoast called-Zofran 4 mg.

## 2016-11-06 NOTE — Procedures (Signed)
US guided core biopsy of a right hepatic lesion.  5 cores performed and 3 adequate specimens obtained.  No immediate complication.  Minimal blood loss.  See full report in Imaging.

## 2016-11-06 NOTE — Consult Note (Signed)
Chief Complaint: Patient was seen in consultation today for liver lesion biopsy at the request of Brahmanday,Govinda R  Referring Physician(s): Brahmanday,Govinda R  Patient Status: [facility] - Out-pt  History of Present Illness: Sarah Horne is a 61 y.o. female with history of left breast cancer and abnormal liver enzymes. Recent imaging demonstrates multiple hepatic lesions. Patient presents for an image guided liver lesion biopsy. Patient has no complaints today. She denies difficulty breathing, fevers, chest pain, or abdominal pain. She does have neuropathy in her hands and feet secondary to chemotherapy.  Past Medical History:  Diagnosis Date  . Breast cancer ([place]) 2016   Left-radiation  . Cancer ([place]) 2016   Stage II, ER positive, PR positive, HER-2/neu overexpressing of the left breast.  . Chemotherapy-induced peripheral neuropathy ([place])   . Heart attack 1998  . Hyperlipidemia   . Hypertension   . Neuromuscular disorder (H[facility])    neuropathy  . Neuropathy [facility])     Past Surgical History:  Procedure Laterality Date  . BREAST BIOPSY Left 2016   Positive  . BREAST LUMPECTOMY WITH SENTINEL LYMPH NODE BIOPSY Left 05/23/2015   Procedure: LEFT BREAST WIDE EXCISION WITH AXILLARY DISSECTION, MASTOPLASTY ;  Surgeon: [name], MD;  Location: [facility] ORS;  Service: General;  Laterality: Left;  . BREAST SURGERY Left 12/18/14   breast biopsy/INVASIVE DUCTAL CARCINOMA OF BREAST, NOTTINGHAM GRADE 2.  Marland Kitchen BREAST SURGERY  05/23/2015.   Wide excision/mastoplasty, axillary dissection. No residual invasive cancer, positive for residual DCIS. 0/2 nodes identified on axillary dissection. (no SLN by technetium or methylene blue)  . CARDIAC CATHETERIZATION    . PORTACATH PLACEMENT Right 12-31-14   Dr Bary Castilla    Allergies: No known allergies  Medications: Prior to Admission medications   Medication Sig Start Date End Date Taking? Authorizing Provider  anastrozole (ARIMIDEX) 1 MG tablet  TAKE 1 TABLET DAILY 11/04/16  Yes Cammie Sickle, MD  aspirin 81 MG tablet Take 81 mg by mouth daily.   Yes Historical Provider, MD  carvedilol (COREG) 6.25 MG tablet Take 1 tablet (6.25 mg total) by mouth 2 (two) times daily. 05/07/16  Yes [name], MD  diphenoxylate-atropine (LOMOTIL) 2.5-0.025 MG tablet Take 1 tablet by mouth 4 (four) times daily as needed for diarrhea or loose stools. Take it along with immodium 09/07/16  Yes Cammie Sickle, MD  lisinopril (PRINIVIL,ZESTRIL) 10 MG tablet TAKE 1 TABLET DAILY 09/30/16  Yes [name], MD  potassium chloride SA (K-DUR,KLOR-CON) 20 MEQ tablet 1 pill twice a day 10/28/16  Yes Cammie Sickle, MD  DULoxetine (CYMBALTA) 60 MG capsule TAKE 1 CAPSULE (60 MG TOTAL) BY MOUTH DAILY. Patient not taking: Reported on 10/27/2016 10/21/16   Cammie Sickle, MD     Family History  Problem Relation Age of Onset  . Breast cancer Maternal Aunt   . Breast cancer Cousin   . Brain cancer Maternal Uncle     Social History   Social History  . Marital status: Divorced    Spouse name: N/A  . Number of children: N/A  . Years of education: N/A   Social History Main Topics  . Smoking status: Current Every Day Smoker    Packs/day: 0.50    Years: 18.00    Types: Cigarettes  . Smokeless tobacco: Never Used  . Alcohol use No  . Drug use: No  . Sexual activity: Not Asked   Other Topics Concern  . None   Social  History Narrative  . None    Review of Systems: A 12 point ROS discussed and pertinent positives are indicated in the HPI above.  All other systems are negative.  Review of Systems  Constitutional: Negative for activity change, appetite change, chills and fever.  Respiratory: Negative.   Cardiovascular: Negative.   Gastrointestinal: Negative.   Genitourinary: Negative.   Neurological: Positive for numbness.    Vital Signs: BP (!) 108/96   Pulse (!) 110   Temp 98.4 F (36.9 C) (Oral)   Resp 19   Ht _0   (1.676 m)   SpO2 97%   Physical Exam  Cardiovascular: Normal rate, regular rhythm and normal heart sounds.   Pulmonary/Chest: Effort normal and breath sounds normal.  Abdominal: Soft. Bowel sounds are normal.    Mallampati Score: 1     Imaging: Ct Chest W Contrast  Result Date: 11/02/2016 CLINICAL DATA:  Left upper outer quadrant breast carcinoma. Elevated liver function tests. Liver masses seen on recent ultrasound. EXAM: CT CHEST, ABDOMEN, AND PELVIS WITH CONTRAST TECHNIQUE: Multidetector CT imaging of the chest, abdomen and pelvis was performed following the standard protocol during bolus administration of intravenous contrast. CONTRAST:  100 mL Isovue-300 COMPARISON:  Ultrasound on 10/26/2016 FINDINGS: CT CHEST FINDINGS Cardiovascular: No acute findings. Mediastinum/Lymph Nodes: No masses or pathologically enlarged lymph nodes identified. Previous left mastectomy. No evidence of axillary lymphadenopathy. Lungs/Pleura: Mild emphysema. Post radiation changes noted in left upper lobe. A 9 mm irregular nodular density is seen in the inferior aspect of the right upper lobe on image 70/6. This does not have typical appearance of pulmonary metastasis, and differential diagnosis includes inflammatory or infectious etiology and small bronchogenic carcinoma. No evidence of pleural effusion. Musculoskeletal:  No suspicious bone lesions identified. CT ABDOMEN AND PELVIS FINDINGS Hepatobiliary: Numerous diffuse low-attenuation masses are seen throughout the right and left hepatic lobes, consistent with diffuse liver metastases. Index mass in segment 8 of the right lobe measures 6.6 x 5.2 cm on image 50/2. Gallbladder is unremarkable. Pancreas:  No mass or inflammatory changes. Spleen:  Within normal limits in size and appearance. Adrenals/Urinary tract:  No masses or hydronephrosis. Stomach/Bowel: No evidence of obstruction, inflammatory process, or abnormal fluid collections. Vascular/Lymphatic: No  pathologically enlarged lymph nodes identified. No abdominal aortic aneurysm. Aortic atherosclerosis. Reproductive: Calcified uterine fibroids seen, largest measuring approximately 4 cm. No adnexal mass or free fluid identified. Other:  None. Musculoskeletal: Ill-defined sclerotic bone lesions seen in the T12 vertebral body and right inferior pubic ramus. Sclerotic bone metastases cannot be excluded. IMPRESSION: Diffuse liver metastases. Ill-defined sclerotic bone lesions in the T12 vertebral body and right inferior pubic ramus. Sclerotic bone metastases cannot excluded. Consider nuclear medicine bone scan for further evaluation. 9 mm irregular pulmonary nodule in right upper lobe, with differential diagnosis including infectious or inflammatory etiology and small primary bronchogenic carcinoma. Recommend continued attention on follow-up CT. Incidentally noted calcified uterine fibroids, aortic atherosclerosis, and mild emphysema. Electronically Signed   By: Earle Gell M.D.   On: 11/02/2016 14:36   US Abdomen Complete  Result Date: 10/26/2016 CLINICAL DATA:  Elevated liver function tests. EXAM: ABDOMEN ULTRASOUND COMPLETE COMPARISON:  Ultrasound dated 03/24/2007 FINDINGS: Gallbladder: No gallstones or wall thickening visualized. No sonographic Murphy sign noted by sonographer. Common bile duct: Diameter: 3.4 mm, normal. Liver: There are multiple solid slightly hypoechoic masses in the liver with the largest being 4 cm in diameter in the anterior aspect of the right lobe. No dilated intrahepatic bile ducts. IVC: No  abnormality visualized. Pancreas: Visualized portion unremarkable. Spleen: Size and appearance within normal limits. Right Kidney: Length: 9.4 cm. Echogenicity within normal limits. No mass or hydronephrosis visualized. Left Kidney: Length: 10.7 cm. Echogenicity within normal limits. No mass or hydronephrosis visualized. Abdominal aorta: No aneurysm visualized.  Maximum diameter 2.4 cm. Other  findings: None. IMPRESSION: Multiple hypoechoic solid masses in the liver consistent with metastatic disease of unknown primary. Electronically Signed   By: Lorriane Shire M.D.   On: 10/26/2016 09:52   Ct Abdomen Pelvis W Contrast  Result Date: 11/02/2016 CLINICAL DATA:  Left upper outer quadrant breast carcinoma. Elevated liver function tests. Liver masses seen on recent ultrasound. EXAM: CT CHEST, ABDOMEN, AND PELVIS WITH CONTRAST TECHNIQUE: Multidetector CT imaging of the chest, abdomen and pelvis was performed following the standard protocol during bolus administration of intravenous contrast. CONTRAST:  100 mL Isovue-300 COMPARISON:  Ultrasound on 10/26/2016 FINDINGS: CT CHEST FINDINGS Cardiovascular: No acute findings. Mediastinum/Lymph Nodes: No masses or pathologically enlarged lymph nodes identified. Previous left mastectomy. No evidence of axillary lymphadenopathy. Lungs/Pleura: Mild emphysema. Post radiation changes noted in left upper lobe. A 9 mm irregular nodular density is seen in the inferior aspect of the right upper lobe on image 70/6. This does not have typical appearance of pulmonary metastasis, and differential diagnosis includes inflammatory or infectious etiology and small bronchogenic carcinoma. No evidence of pleural effusion. Musculoskeletal:  No suspicious bone lesions identified. CT ABDOMEN AND PELVIS FINDINGS Hepatobiliary: Numerous diffuse low-attenuation masses are seen throughout the right and left hepatic lobes, consistent with diffuse liver metastases. Index mass in segment 8 of the right lobe measures 6.6 x 5.2 cm on image 50/2. Gallbladder is unremarkable. Pancreas:  No mass or inflammatory changes. Spleen:  Within normal limits in size and appearance. Adrenals/Urinary tract:  No masses or hydronephrosis. Stomach/Bowel: No evidence of obstruction, inflammatory process, or abnormal fluid collections. Vascular/Lymphatic: No pathologically enlarged lymph nodes identified. No  abdominal aortic aneurysm. Aortic atherosclerosis. Reproductive: Calcified uterine fibroids seen, largest measuring approximately 4 cm. No adnexal mass or free fluid identified. Other:  None. Musculoskeletal: Ill-defined sclerotic bone lesions seen in the T12 vertebral body and right inferior pubic ramus. Sclerotic bone metastases cannot be excluded. IMPRESSION: Diffuse liver metastases. Ill-defined sclerotic bone lesions in the T12 vertebral body and right inferior pubic ramus. Sclerotic bone metastases cannot excluded. Consider nuclear medicine bone scan for further evaluation. 9 mm irregular pulmonary nodule in right upper lobe, with differential diagnosis including infectious or inflammatory etiology and small primary bronchogenic carcinoma. Recommend continued attention on follow-up CT. Incidentally noted calcified uterine fibroids, aortic atherosclerosis, and mild emphysema. Electronically Signed   By: Earle Gell M.D.   On: 11/02/2016 14:36    Labs:  CBC:  Recent Labs  10/05/16 1530 10/27/16 1138 10/30/16 1540 11/06/16 1017  WBC 5.4 4.9 5.2 4.7  HGB 12.9 14.4 13.5 15.3  HCT 38.2 43.8 40.2 43.9  PLT 198 226 231 257    COAGS:  Recent Labs  10/27/16 1337 10/30/16 1540 11/06/16 1017  INR 1.00 1.06 0.95  APTT 61* 32  24.2 30    BMP:  Recent Labs  10/05/16 1530 10/12/16 1607 10/27/16 1138 10/30/16 1540  NA 135 134* 137 136  K 3.2* 4.8 3.6 3.5  CL 102 99* 101 101  CO2 _0 GLUCOSE 106* 74 93 87  BUN _1 CALCIUM 8.7* 9.5 9.0 9.0  CREATININE 0.89 1.01* 0.93 0.88  GFRNONAA >60 58* >60 >60  GFRAA >60 >60 >60 >60    LIVER FUNCTION TESTS:  Recent Labs  10/19/16 1525 10/26/16 0825 10/27/16 1138 10/30/16 1540  BILITOT 0.8 1.1 0.7 0.9  AST 130* 145* 120* 133*  ALT 138* 137* 125* 128*  ALKPHOS 291* 304* 284* 284*  PROT 7.8 7.8 7.9 7.6  ALBUMIN 4.0 3.9 3.8 3.8    TUMOR MARKERS:  Recent Labs  10/27/16 1337  CEA 4.0    Assessment and  Plan:  61 year old female with history of breast cancer and abnormal liver enzymes and multiple hepatic lesions. Patient presents for ultrasound-guided liver lesion biopsy. Risks of the procedure including bleeding were discussed with the patient. Informed consent was obtained from the patient. Plan for ultrasound-guided liver lesion biopsy with moderate sedation.  Thank you for this interesting consult.  I greatly enjoyed meeting JULIYA MAGILL and look forward to participating in their care.  A copy of this report was sent to the requesting provider on this date.  Electronically Signed: Carylon Perches 11/06/2016, 11:20 AM   I spent a total of  15 Minutes   in face to face in clinical consultation, greater than 50% of which was counseling/coordinating care for image guided liver lesion biopsy.

## 2016-11-07 ENCOUNTER — Encounter: Payer: Self-pay | Admitting: Internal Medicine

## 2016-11-09 ENCOUNTER — Telehealth: Payer: Self-pay | Admitting: Internal Medicine

## 2016-11-09 ENCOUNTER — Other Ambulatory Visit: Payer: Self-pay | Admitting: Internal Medicine

## 2016-11-09 DIAGNOSIS — Z79899 Other long term (current) drug therapy: Secondary | ICD-10-CM

## 2016-11-09 DIAGNOSIS — Z5181 Encounter for therapeutic drug level monitoring: Secondary | ICD-10-CM

## 2016-11-09 DIAGNOSIS — Z170 Estrogen receptor positive status [ER+]: Principal | ICD-10-CM

## 2016-11-09 DIAGNOSIS — C50412 Malignant neoplasm of upper-outer quadrant of left female breast: Secondary | ICD-10-CM

## 2016-11-09 NOTE — Telephone Encounter (Signed)
Spoke with pt. Agreeable for muga scan. She understands scheduling team will contact her with an apt. Pt given instructions on her apt date/time with Dr. Rogue Bussing tomorrow.

## 2016-11-09 NOTE — Telephone Encounter (Signed)
Please inform pt that we are still awaiting the Biopsy results- but we would like to go head with MUGA scan in preparation of chemo once the biopsy results are back.

## 2016-11-09 NOTE — Progress Notes (Signed)
START ON PATHWAY REGIMEN - Breast  [id number]: Docetaxel + Trastuzumab + Pertuzumab (THP) q21 Days Until Progression or Toxicity   A cycle is every 21 days:     Pertuzumab (Perjeta(R)) 840 mg flat dose IV in 250 mL NS over 60 minutes as a loading dose cycle 1 only. See observation note below. Dose Mod: None     Pertuzumab (Perjeta(R)) 420 mg flat dose IV in 250 mL NS over 30 minutes as maintenance dose cycles 2 and beyond. An observation period of 30-60 minutes is recommended after each dose prior to subsequent infusion of trastuzumab or docetaxel. Dose Mod: None     Trastuzumab (Herceptin(R)) 8 mg/kg IV in 250 mL NS over 90 minutes as a loading dose cycle 1 only. Dose Mod: None     Trastuzumab (Herceptin(R)) 6 mg/kg IV in 250 mL NS over 30 minutes as maintenance dose cycles 2 and beyond. Dose Mod: None     Docetaxel (Taxotere(R)) 75 mg/m2 IV in 250 mL NS over 1 hour. Study considered docetaxel as optional beyond the 6th cycle. Dose Mod: None Additional Orders: Premedicate with dexamethasone 8 mg PO bid for three days beginning 1 day prior to docetaxel therapy. Recommended Monitoring: LVEF baseline and q3-4 months or with the development of cardiac symptoms and regimen changes for metastatic treatment.  **Always confirm dose/schedule in your pharmacy ordering system**    Patient Characteristics: Metastatic Chemotherapy, HER2/neu Positive, ER +, First Line AJCC Stage Grouping: IV Current Disease Status: Distant Metastases AJCC M Stage: X ER Status: Positive (+) AJCC N Stage: X AJCC T Stage: X HER2/neu: Positive (+) PR Status: Positive (+) Line of therapy: First Line Would you be surprised if this patient died  in the next year? I would be surprised if this patient died in the next year  Intent of Therapy: Non-Curative / Palliative Intent, Discussed with Patient

## 2016-11-09 NOTE — Telephone Encounter (Signed)
msg sent to sch. Team to arrange muga scan

## 2016-11-10 ENCOUNTER — Emergency Department: Payer: BLUE CROSS/BLUE SHIELD

## 2016-11-10 ENCOUNTER — Inpatient Hospital Stay: Payer: BLUE CROSS/BLUE SHIELD

## 2016-11-10 ENCOUNTER — Ambulatory Visit: Payer: BLUE CROSS/BLUE SHIELD | Admitting: Internal Medicine

## 2016-11-10 ENCOUNTER — Inpatient Hospital Stay
Admission: EM | Admit: 2016-11-10 | Discharge: 2016-11-12 | DRG: 054 | Disposition: A | Discharge status: Home / Self Care | Payer: BLUE CROSS/BLUE SHIELD | Attending: Internal Medicine | Admitting: Internal Medicine

## 2016-11-10 ENCOUNTER — Inpatient Hospital Stay: Payer: BLUE CROSS/BLUE SHIELD | Admitting: Internal Medicine

## 2016-11-10 ENCOUNTER — Telehealth: Payer: Self-pay | Admitting: Internal Medicine

## 2016-11-10 ENCOUNTER — Encounter: Payer: Self-pay | Admitting: Emergency Medicine

## 2016-11-10 DIAGNOSIS — R4701 Aphasia: Secondary | ICD-10-CM | POA: Diagnosis present

## 2016-11-10 DIAGNOSIS — C50912 Malignant neoplasm of unspecified site of left female breast: Secondary | ICD-10-CM | POA: Diagnosis present

## 2016-11-10 DIAGNOSIS — E785 Hyperlipidemia, unspecified: Secondary | ICD-10-CM | POA: Diagnosis not present

## 2016-11-10 DIAGNOSIS — I427 Cardiomyopathy due to drug and external agent: Secondary | ICD-10-CM

## 2016-11-10 DIAGNOSIS — Z515 Encounter for palliative care: Secondary | ICD-10-CM | POA: Diagnosis not present

## 2016-11-10 DIAGNOSIS — Z7189 Other specified counseling: Secondary | ICD-10-CM

## 2016-11-10 DIAGNOSIS — G936 Cerebral edema: Secondary | ICD-10-CM | POA: Diagnosis not present

## 2016-11-10 DIAGNOSIS — C787 Secondary malignant neoplasm of liver and intrahepatic bile duct: Secondary | ICD-10-CM | POA: Diagnosis present

## 2016-11-10 DIAGNOSIS — I429 Cardiomyopathy, unspecified: Secondary | ICD-10-CM | POA: Diagnosis not present

## 2016-11-10 DIAGNOSIS — I10 Essential (primary) hypertension: Secondary | ICD-10-CM | POA: Diagnosis present

## 2016-11-10 DIAGNOSIS — Z923 Personal history of irradiation: Secondary | ICD-10-CM

## 2016-11-10 DIAGNOSIS — C799 Secondary malignant neoplasm of unspecified site: Secondary | ICD-10-CM | POA: Diagnosis not present

## 2016-11-10 DIAGNOSIS — R51 Headache: Secondary | ICD-10-CM | POA: Diagnosis not present

## 2016-11-10 DIAGNOSIS — F17210 Nicotine dependence, cigarettes, uncomplicated: Secondary | ICD-10-CM | POA: Diagnosis present

## 2016-11-10 DIAGNOSIS — C50919 Malignant neoplasm of unspecified site of unspecified female breast: Secondary | ICD-10-CM | POA: Diagnosis not present

## 2016-11-10 DIAGNOSIS — T451X5A Adverse effect of antineoplastic and immunosuppressive drugs, initial encounter: Secondary | ICD-10-CM | POA: Diagnosis present

## 2016-11-10 DIAGNOSIS — Z803 Family history of malignant neoplasm of breast: Secondary | ICD-10-CM | POA: Diagnosis not present

## 2016-11-10 DIAGNOSIS — C773 Secondary and unspecified malignant neoplasm of axilla and upper limb lymph nodes: Secondary | ICD-10-CM | POA: Diagnosis not present

## 2016-11-10 DIAGNOSIS — Z7982 Long term (current) use of aspirin: Secondary | ICD-10-CM

## 2016-11-10 DIAGNOSIS — Z79811 Long term (current) use of aromatase inhibitors: Secondary | ICD-10-CM

## 2016-11-10 DIAGNOSIS — C50412 Malignant neoplasm of upper-outer quadrant of left female breast: Secondary | ICD-10-CM | POA: Diagnosis not present

## 2016-11-10 DIAGNOSIS — Z79899 Other long term (current) drug therapy: Secondary | ICD-10-CM | POA: Diagnosis not present

## 2016-11-10 DIAGNOSIS — Z170 Estrogen receptor positive status [ER+]: Secondary | ICD-10-CM | POA: Diagnosis not present

## 2016-11-10 DIAGNOSIS — I252 Old myocardial infarction: Secondary | ICD-10-CM

## 2016-11-10 DIAGNOSIS — C7931 Secondary malignant neoplasm of brain: Principal | ICD-10-CM | POA: Diagnosis present

## 2016-11-10 DIAGNOSIS — Z510 Encounter for antineoplastic radiation therapy: Secondary | ICD-10-CM | POA: Diagnosis not present

## 2016-11-10 LAB — PROTIME-INR
INR: 1.04
Prothrombin Time: 13.6 seconds (ref 11.4–15.2)

## 2016-11-10 LAB — COMPREHENSIVE METABOLIC PANEL
ALBUMIN: 3.7 g/dL (ref 3.5–5.0)
ALK PHOS: 269 U/L — AB (ref 38–126)
ALT: 96 U/L — AB (ref 14–54)
ANION GAP: 10 (ref 5–15)
AST: 133 U/L — AB (ref 15–41)
BUN: 11 mg/dL (ref 6–20)
CALCIUM: 8.8 mg/dL — AB (ref 8.9–10.3)
CO2: 27 mmol/L (ref 22–32)
Chloride: 99 mmol/L — ABNORMAL LOW (ref 101–111)
Creatinine, Ser: 1.04 mg/dL — ABNORMAL HIGH (ref 0.44–1.00)
GFR calc Af Amer: >60 mL/min (ref >60)
GFR calc non Af Amer: 56 mL/min — ABNORMAL LOW (ref >60)
GLUCOSE: 89 mg/dL (ref 65–99)
Potassium: 4.1 mmol/L (ref 3.5–5.1)
SODIUM: 136 mmol/L (ref 135–145)
Total Bilirubin: 1.2 mg/dL (ref 0.3–1.2)
Total Protein: 7.3 g/dL (ref 6.5–8.1)

## 2016-11-10 LAB — DIFFERENTIAL
BASOS ABS: 0 10*3/uL (ref 0–0.1)
Basophils Relative: 1 %
EOS PCT: 2 %
Eosinophils Absolute: 0.1 10*3/uL (ref 0–0.7)
LYMPHS PCT: 17 %
Lymphs Abs: 0.9 10*3/uL — ABNORMAL LOW (ref 1.0–3.6)
Monocytes Absolute: 0.4 10*3/uL (ref 0.2–0.9)
Monocytes Relative: 8 %
NEUTROS ABS: 3.7 10*3/uL (ref 1.4–6.5)
NEUTROS PCT: 72 %

## 2016-11-10 LAB — CBC
HCT: 40.3 % (ref 35.0–47.0)
Hemoglobin: 14.1 g/dL (ref 12.0–16.0)
MCH: 32.4 pg (ref 26.0–34.0)
MCHC: 35 g/dL (ref 32.0–36.0)
MCV: 92.8 fL (ref 80.0–100.0)
PLATELETS: 265 10*3/uL (ref 150–440)
RBC: 4.35 MIL/uL (ref 3.80–5.20)
RDW: 14.7 % — ABNORMAL HIGH (ref 11.5–14.5)
WBC: 5.1 10*3/uL (ref 3.6–11.0)

## 2016-11-10 LAB — URINALYSIS, COMPLETE (UACMP) WITH MICROSCOPIC
BACTERIA UA: NONE SEEN
BILIRUBIN URINE: NEGATIVE
Glucose, UA: 50 mg/dL — AB
HGB URINE DIPSTICK: NEGATIVE
Ketones, ur: NEGATIVE mg/dL
LEUKOCYTES UA: NEGATIVE
NITRITE: NEGATIVE
PH: 5 (ref 5.0–8.0)
Protein, ur: NEGATIVE mg/dL
RBC / HPF: NONE SEEN RBC/hpf (ref 0–5)
SPECIFIC GRAVITY, URINE: 1.012 (ref 1.005–1.030)
WBC, UA: NONE SEEN WBC/hpf (ref 0–5)

## 2016-11-10 LAB — TROPONIN I: Troponin I: <0.03 ng/mL (ref <0.03)

## 2016-11-10 LAB — ETHANOL

## 2016-11-10 LAB — GLUCOSE, CAPILLARY: Glucose-Capillary: 93 mg/dL (ref 65–99)

## 2016-11-10 LAB — AMMONIA: AMMONIA: 16 umol/L (ref 9–35)

## 2016-11-10 LAB — APTT: aPTT: 31 seconds (ref 24–36)

## 2016-11-10 MED ORDER — CARVEDILOL 6.25 MG PO TABS
6.2500 mg | ORAL_TABLET | Freq: Two times a day (BID) | ORAL | Status: DC
  Administered 2016-11-10 – 2016-11-12 (×4): 6.25 mg via ORAL
  Filled 2016-11-10 (×4): qty 1

## 2016-11-10 MED ORDER — ACETAMINOPHEN 650 MG RE SUPP: 650.0000 mg | Freq: Four times a day (QID) | RECTAL | Status: DC | PRN

## 2016-11-10 MED ORDER — SODIUM CHLORIDE 0.9% FLUSH: 3.0000 mL | INTRAVENOUS | Status: DC | PRN

## 2016-11-10 MED ORDER — ENOXAPARIN SODIUM 40 MG/0.4ML ~~LOC~~ SOLN
40.0000 mg | SUBCUTANEOUS | Status: DC
  Administered 2016-11-10 – 2016-11-12 (×3): 40 mg via SUBCUTANEOUS
  Filled 2016-11-10 (×3): qty 0.4

## 2016-11-10 MED ORDER — ONDANSETRON HCL 4 MG PO TABS: 4.0000 mg | ORAL_TABLET | Freq: Four times a day (QID) | ORAL | Status: DC | PRN

## 2016-11-10 MED ORDER — GADOBENATE DIMEGLUMINE 529 MG/ML IV SOLN
15.0000 mL | Freq: Once | INTRAVENOUS | Status: AC | PRN
  Administered 2016-11-10: 14 mL via INTRAVENOUS

## 2016-11-10 MED ORDER — IBUPROFEN 600 MG PO TABS: 600.0000 mg | ORAL_TABLET | Freq: Four times a day (QID) | ORAL | Status: DC | PRN

## 2016-11-10 MED ORDER — DEXAMETHASONE SODIUM PHOSPHATE 10 MG/ML IJ SOLN
10.0000 mg | Freq: Once | INTRAMUSCULAR | Status: AC
  Administered 2016-11-10: 10 mg via INTRAVENOUS
  Filled 2016-11-10: qty 1

## 2016-11-10 MED ORDER — POLYETHYLENE GLYCOL 3350 17 G PO PACK: 17.0000 g | PACK | Freq: Every day | ORAL | Status: DC | PRN

## 2016-11-10 MED ORDER — KETOROLAC TROMETHAMINE 15 MG/ML IJ SOLN
15.0000 mg | Freq: Four times a day (QID) | INTRAMUSCULAR | Status: DC | PRN
  Filled 2016-11-10: qty 1

## 2016-11-10 MED ORDER — DEXAMETHASONE SODIUM PHOSPHATE 10 MG/ML IJ SOLN
10.0000 mg | Freq: Four times a day (QID) | INTRAMUSCULAR | Status: DC
  Administered 2016-11-10: 10 mg via INTRAVENOUS
  Filled 2016-11-10: qty 1

## 2016-11-10 MED ORDER — SODIUM CHLORIDE 0.9% FLUSH
3.0000 mL | Freq: Two times a day (BID) | INTRAVENOUS | Status: DC
  Administered 2016-11-10 – 2016-11-11 (×4): 3 mL via INTRAVENOUS

## 2016-11-10 MED ORDER — ACETAMINOPHEN 325 MG PO TABS
650.0000 mg | ORAL_TABLET | Freq: Four times a day (QID) | ORAL | Status: DC | PRN
  Administered 2016-11-10: 21:00:00 650 mg via ORAL
  Filled 2016-11-10: qty 2

## 2016-11-10 MED ORDER — SODIUM CHLORIDE 0.9 % IV SOLN: 250.0000 mL | INTRAVENOUS | Status: DC | PRN

## 2016-11-10 MED ORDER — ACETAMINOPHEN 325 MG PO TABS
650.0000 mg | ORAL_TABLET | Freq: Once | ORAL | Status: AC
  Administered 2016-11-10: 650 mg via ORAL
  Filled 2016-11-10: qty 2

## 2016-11-10 MED ORDER — DEXAMETHASONE SODIUM PHOSPHATE 4 MG/ML IJ SOLN
4.0000 mg | Freq: Four times a day (QID) | INTRAMUSCULAR | Status: DC
  Administered 2016-11-10 – 2016-11-12 (×7): 4 mg via INTRAVENOUS
  Filled 2016-11-10 (×7): qty 1

## 2016-11-10 MED ORDER — ALBUTEROL SULFATE (2.5 MG/3ML) 0.083% IN NEBU: 2.5000 mg | INHALATION_SOLUTION | RESPIRATORY_TRACT | Status: DC | PRN

## 2016-11-10 MED ORDER — ONDANSETRON HCL 4 MG/2ML IJ SOLN: 4.0000 mg | Freq: Four times a day (QID) | INTRAMUSCULAR | Status: DC | PRN

## 2016-11-10 MED ORDER — DIPHENOXYLATE-ATROPINE 2.5-0.025 MG PO TABS: 1.0000 | ORAL_TABLET | Freq: Four times a day (QID) | ORAL | Status: DC | PRN

## 2016-11-10 MED ORDER — ANASTROZOLE 1 MG PO TABS
1.0000 mg | ORAL_TABLET | Freq: Every day | ORAL | Status: DC
  Administered 2016-11-10 – 2016-11-12 (×3): 1 mg via ORAL
  Filled 2016-11-10 (×3): qty 1

## 2016-11-10 NOTE — H&P (Signed)
[place] [facility] [place] NAME: Sarah Horne    MR#:  [id number]  DATE OF BIRTH:  1956/06/04  DATE OF ADMISSION:  11/10/2016  PRIMARY CARE PHYSICIAN: Keith Rake, MD   REQUESTING/REFERRING PHYSICIAN: Dr. Burlene Arnt  CHIEF COMPLAINT:   Chief Complaint  Patient presents with  . Headache    HISTORY OF PRESENT ILLNESS:  Sarah Horne  is a 61 y.o. female with a known history of Hypertension, breast cancer with liver metastases presents to the hospital due to left-sided headache and expressive aphasia. No focal numbness or weakness. No trouble swallowing or change in vision. CT scan of the head shows left frontoparietal mass with surrounding edema. Patient with liver metastases and now new brain metastases is being admitted for IV Decadron. Oncology Dr. Rogue Bussing has been contacted from the emergency room.  PAST MEDICAL HISTORY:   Past Medical History:  Diagnosis Date  . Breast cancer ([place]) 2016   Left-radiation  . Cancer ([place]) 2016   Stage II, ER positive, PR positive, HER-2/neu overexpressing of the left breast.  . Chemotherapy-induced peripheral neuropathy ([place])   . Heart attack 1998  . Hyperlipidemia   . Hypertension   . Neuromuscular disorder ([facility])    neuropathy  . Neuropathy ([place])     PAST SURGICAL HISTORY:   Past Surgical History:  Procedure Laterality Date  . BREAST BIOPSY Left 2016   Positive  . BREAST LUMPECTOMY WITH SENTINEL LYMPH NODE BIOPSY Left 05/23/2015   Procedure: LEFT BREAST WIDE EXCISION WITH AXILLARY DISSECTION, MASTOPLASTY ;  Surgeon: [name], MD;  Location: [facility] ORS;  Service: General;  Laterality: Left;  . BREAST SURGERY Left 12/18/14   breast biopsy/INVASIVE DUCTAL CARCINOMA OF BREAST, NOTTINGHAM GRADE 2.  Marland Kitchen BREAST SURGERY  05/23/2015.   Wide excision/mastoplasty, axillary dissection. No residual invasive cancer, positive for residual DCIS. 0/2 nodes identified on axillary dissection. (no SLN by technetium  or methylene blue)  . CARDIAC CATHETERIZATION    . PORTACATH PLACEMENT Right 12-31-14   Dr Bary Castilla    SOCIAL HISTORY:   Social History  Substance Use Topics  . Smoking status: Current Every Day Smoker    Packs/day: 0.50    Years: 18.00    Types: Cigarettes  . Smokeless tobacco: Never Used  . Alcohol use No    FAMILY HISTORY:   Family History  Problem Relation Age of Onset  . Breast cancer Maternal Aunt   . Breast cancer Cousin   . Brain cancer Maternal Uncle     DRUG ALLERGIES:   Allergies  Allergen Reactions  . No Known Allergies     REVIEW OF SYSTEMS:   Review of Systems  Constitutional: Positive for malaise/fatigue. Negative for chills, fever and weight loss.  HENT: Negative for hearing loss and nosebleeds.   Eyes: Negative for blurred vision, double vision and pain.  Respiratory: Negative for cough, hemoptysis, sputum production, shortness of breath and wheezing.   Cardiovascular: Negative for chest pain, palpitations, orthopnea and leg swelling.  Gastrointestinal: Negative for abdominal pain, constipation, diarrhea, nausea and vomiting.  Genitourinary: Negative for dysuria and hematuria.  Musculoskeletal: Negative for back pain, falls and myalgias.  Skin: Negative for rash.  Neurological: Positive for speech change, weakness and headaches. Negative for dizziness, tremors, sensory change, focal weakness and seizures.  Endo/Heme/Allergies: Does not bruise/bleed easily.  Psychiatric/Behavioral: Negative for depression and memory loss. The patient is not nervous/anxious.     MEDICATIONS AT HOME:   Prior to Admission medications  Medication Sig Start Date End Date Taking? Authorizing Provider  anastrozole (ARIMIDEX) 1 MG tablet TAKE 1 TABLET DAILY 11/04/16  Yes Cammie Sickle, MD  aspirin 81 MG tablet Take 81 mg by mouth daily.   Yes Historical Provider, MD  carvedilol (COREG) 6.25 MG tablet Take 1 tablet (6.25 mg total) by mouth 2 (two) times daily.  05/07/16  Yes [name], MD  lisinopril (PRINIVIL,ZESTRIL) 10 MG tablet TAKE 1 TABLET DAILY 09/30/16  Yes [name], MD  diphenoxylate-atropine (LOMOTIL) 2.5-0.025 MG tablet Take 1 tablet by mouth 4 (four) times daily as needed for diarrhea or loose stools. Take it along with immodium 09/07/16   Cammie Sickle, MD  DULoxetine (CYMBALTA) 60 MG capsule TAKE 1 CAPSULE (60 MG TOTAL) BY MOUTH DAILY. Patient not taking: Reported on 10/27/2016 10/21/16   Cammie Sickle, MD  potassium chloride SA (K-DUR,KLOR-CON) 20 MEQ tablet 1 pill twice a day Patient not taking: Reported on 11/10/2016 10/28/16   Cammie Sickle, MD     VITAL SIGNS:  Blood pressure 116/86, pulse 98, temperature 98.2 F (36.8 C), resp. rate 19, height _0  (1.676 m), weight 70.8 kg (156 lb), SpO2 99 %.  PHYSICAL EXAMINATION:  Physical Exam  GENERAL:  61 y.o.-year-old patient lying in the bed with no acute distress.  EYES: Pupils equal, round, reactive to light and accommodation. No scleral icterus. Extraocular muscles intact.  HEENT: Head atraumatic, normocephalic. Oropharynx and nasopharynx clear. No oropharyngeal erythema, moist oral mucosa  NECK:  Supple, no jugular venous distention. No thyroid enlargement, no tenderness.  LUNGS: Normal breath sounds bilaterally, no wheezing, rales, rhonchi. No use of accessory muscles of respiration.  CARDIOVASCULAR: S1, S2 normal. No murmurs, rubs, or gallops.  ABDOMEN: Soft, nontender, nondistended. Bowel sounds present. No organomegaly or mass.  EXTREMITIES: No pedal edema, cyanosis, or clubbing. + 2 pedal & radial pulses b/l.   NEUROLOGIC: Cranial nerves II through XII are intact. No focal Motor or sensory deficits appreciated b/l. expressive aphasia PSYCHIATRIC: The patient is alert and oriented x 3. Good affect.  SKIN: No obvious rash, lesion, or ulcer.   LABORATORY PANEL:   CBC  Recent Labs Lab 11/10/16 0749  WBC 5.1  HGB 14.1  HCT 40.3  PLT 265    ------------------------------------------------------------------------------------------------------------------  Chemistries   Recent Labs Lab 11/10/16 0749  NA 136  K 4.1  CL 99*  CO2 27  GLUCOSE 89  BUN 11  CREATININE 1.04*  CALCIUM 8.8*  AST 133*  ALT 96*  ALKPHOS 269*  BILITOT 1.2   ------------------------------------------------------------------------------------------------------------------  Cardiac Enzymes  Recent Labs Lab 11/10/16 0749  TROPONINI <0.03   ------------------------------------------------------------------------------------------------------------------  RADIOLOGY:  Ct Head Wo Contrast  Result Date: 11/10/2016 CLINICAL DATA:  Headache. EXAM: CT HEAD WITHOUT CONTRAST TECHNIQUE: Contiguous axial images were obtained from the base of the skull through the vertex without intravenous contrast. COMPARISON:  None. FINDINGS: Brain: Ventricular size is within normal limits. There is no evidence of hemorrhage. White matter edema is noted in the left temporal and parietal lobes concerning for underlying mass lesion. There is approximately 7 mm of left-to-right midline shift. Vascular: No hyperdense vessel or unexpected calcification. Skull: Normal. Negative for fracture or focal lesion. Sinuses/Orbits: No acute finding. Other: None. IMPRESSION: White matter edema is noted in the left temporal and parietal lobes concerning for underlying mass lesion. MRI of the brain with and without gadolinium administration is recommended for further evaluation. Electronically Signed   By: Sabino Dick  Brooke Bonito, M.D.   On: 11/10/2016 08:25     IMPRESSION AND PLAN:   * Brain mets with likely primary being breast cancer Surrounding edema with aphasia Start decadron Check MRI brain with contrast. Also has liver metastases. Consult oncology. Further treatment options after MRI per oncology  * HTN Home meds  * DVT prophylaxis Lovenox  All the records are reviewed and  case discussed with ED provider. Management plans discussed with the patient, family and they are in agreement.  CODE STATUS: FULL CODE  TOTAL TIME TAKING CARE OF THIS PATIENT: 40 minutes.   Hillary Bow R M.D on 11/10/2016 at 11:18 AM  Between 7am to 6pm - Pager - [phone]  After 6pm go to [email or web address] - password [email or web address] [facility]  Office  [phone]  CC: Primary care physician; Keith Rake, MD  Note: This dictation was prepared with Dragon dictation along with smaller phrase technology. Any transcriptional errors that result from this process are unintentional.

## 2016-11-10 NOTE — ED Triage Notes (Signed)
Pt presents to ED with severe headache on the left side of her head since Saturday. Denies changes in her vision or nausea. Speech clear at this time with no increased work of breathing noted.

## 2016-11-10 NOTE — Telephone Encounter (Signed)
Spoke to Dr.Crystal re: pt's brain mets. Spoke to Dr.Sudini; recommend dex 4 mg IV q 6 hours. Will evaluate the patient in AM.

## 2016-11-10 NOTE — ED Provider Notes (Addendum)
[facility] Emergency Department Provider Note  ____________________________________________   I have reviewed the triage vital signs and the nursing notes.   HISTORY  Chief Complaint Headache    HPI Sarah Horne is a 61 y.o. female  w hx of breast cancer who had a US guided liver bx on Friday.  Per notes, she did have a hypotensive episode afterwards.  She states that since the procedure, she has had a headache on the left side as well as some word finding difficulties.  The word finding difficulties are somewhat difficult for her to describe. She states "it's like when you're watching TV and he hears something over and over in your head and then repeat that instead of what you mean to say" the headache itself was gradual in onset. Aside from difficulty speaking, she has no other complaints or concerns. She denies any focal neurologic deficits, including trouble making words or walking or trouble swallowing, she denies nausea vomiting diarrhea or fever or stiff neck. She does all or closed head injury.   Past Medical History:  Diagnosis Date  . Breast cancer ([place]) 2016   Left-radiation  . Cancer ([place]) 2016   Stage II, ER positive, PR positive, HER-2/neu overexpressing of the left breast.  . Chemotherapy-induced peripheral neuropathy ([place])   . Heart attack 1998  . Hyperlipidemia   . Hypertension   . Neuromuscular disorder (HCC)    neuropathy  . Neuropathy [facility])     Patient Active Problem List   Diagnosis Date Noted  . Encounter for monitoring cardiotoxic drug therapy 11/09/2016  . Elevated LFTs 10/20/2016  . URI with cough and congestion 08/25/2016  . Peripheral neuropathy due to chemotherapy ([place]) 03/13/2016  . Encounter for antineoplastic chemotherapy 03/13/2016  . Hyperlipidemia 01/29/2016  . Hyperglycemia 01/29/2016  . Cardiomyopathy ([place]) 01/13/2016  . Hypertension 07/18/2015  . Carcinoma of upper-outer quadrant of left breast in female,  estrogen receptor positive ([place]) 12/27/2014    Past Surgical History:  Procedure Laterality Date  . BREAST BIOPSY Left 2016   Positive  . BREAST LUMPECTOMY WITH SENTINEL LYMPH NODE BIOPSY Left 05/23/2015   Procedure: LEFT BREAST WIDE EXCISION WITH AXILLARY DISSECTION, MASTOPLASTY ;  Surgeon: [name], MD;  Location: [facility] ORS;  Service: General;  Laterality: Left;  . BREAST SURGERY Left 12/18/14   breast biopsy/INVASIVE DUCTAL CARCINOMA OF BREAST, NOTTINGHAM GRADE 2.  Marland Kitchen BREAST SURGERY  05/23/2015.   Wide excision/mastoplasty, axillary dissection. No residual invasive cancer, positive for residual DCIS. 0/2 nodes identified on axillary dissection. (no SLN by technetium or methylene blue)  . CARDIAC CATHETERIZATION    . PORTACATH PLACEMENT Right 12-31-14   Dr Bary Castilla    Prior to Admission medications   Medication Sig Start Date End Date Taking? Authorizing Provider  anastrozole (ARIMIDEX) 1 MG tablet TAKE 1 TABLET DAILY 11/04/16   Cammie Sickle, MD  aspirin 81 MG tablet Take 81 mg by mouth daily.    Historical Provider, MD  carvedilol (COREG) 6.25 MG tablet Take 1 tablet (6.25 mg total) by mouth 2 (two) times daily. 05/07/16   [name], MD  diphenoxylate-atropine (LOMOTIL) 2.5-0.025 MG tablet Take 1 tablet by mouth 4 (four) times daily as needed for diarrhea or loose stools. Take it along with immodium 09/07/16   Cammie Sickle, MD  DULoxetine (CYMBALTA) 60 MG capsule TAKE 1 CAPSULE (60 MG TOTAL) BY MOUTH DAILY. Patient not taking: Reported on 10/27/2016 10/21/16   Cammie Sickle, MD  lisinopril (PRINIVIL,ZESTRIL) 10 MG tablet TAKE 1 TABLET DAILY 09/30/16   [name], MD  potassium chloride SA (K-DUR,KLOR-CON) 20 MEQ tablet 1 pill twice a day 10/28/16   Cammie Sickle, MD    Allergies No known allergies  Family History  Problem Relation Age of Onset  . Breast cancer Maternal Aunt   . Breast cancer Cousin   . Brain cancer Maternal Uncle      Social History Social History  Substance Use Topics  . Smoking status: Current Every Day Smoker    Packs/day: 0.50    Years: 18.00    Types: Cigarettes  . Smokeless tobacco: Never Used  . Alcohol use No    Review of Systems Constitutional: No fever/chills Eyes: No visual changes. ENT: No sore throat. No stiff neck no neck pain Cardiovascular: Denies chest pain. Respiratory: Denies shortness of breath. Gastrointestinal:   no vomiting.  No diarrhea.  No constipation. Genitourinary: Negative for dysuria. Musculoskeletal: Negative lower extremity swelling Skin: Negative for rash. Neurological: See history of present illness 10-point ROS otherwise negative.  ____________________________________________   PHYSICAL EXAM:  VITAL SIGNS: ED Triage Vitals  Enc Vitals Group     BP 11/10/16 0629 (!) 143/92     Pulse Rate 11/10/16 0629 93     Resp 11/10/16 0629 20     Temp 11/10/16 0629 98.2 F (36.8 C)     Temp Source 11/10/16 0629 Oral     SpO2 11/10/16 0629 100 %     Weight 11/10/16 0630 156 lb (70.8 kg)     Height 11/10/16 0630 5' 6"  (1.676 m)     Head Circumference --      Peak Flow --      Pain Score 11/10/16 0630 7     Pain Loc --      Pain Edu? --      Excl. in [place]? --     Constitutional: Alert and orientedTo name unsure of the date, although she does get confused when she talks about time, she states that her procedure was "in January, I mean Friday". Well appearing and in no acute distress. Eyes: Conjunctivae are normal. PERRL. EOMI. Head: Atraumatic. Nose: No congestion/rhinnorhea. Mouth/Throat: Mucous membranes are moist.  Oropharynx non-erythematous. Neck: No stridor.   Nontender with no meningismus Cardiovascular: Normal rate, regular rhythm. Grossly normal heart sounds.  Good peripheral circulation. Respiratory: Normal respiratory effort.  No retractions. Lungs CTAB. Abdominal: Soft and nontender. No distention. No guarding no rebound Back:  There is no  focal tenderness or step off.  there is no midline tenderness there are no lesions noted. there is no CVA tenderness Musculoskeletal: No lower extremity tenderness, no upper extremity tenderness. No joint effusions, no DVT signs strong distal pulses no edema Neurologic:  Cranial nerves II through XII are grossly intact 5 out of 5 strength bilateral upper and lower extremity. Finger to nose within normal limits heel to shin within normal limits, he does not have dysarthria but she has some degree of confusion and halting speech. She has word finding difficulty. reflexes symmetric, pupils are equally round and reactive to light, there is no pronator drift, sensation is normal, vision is intact to confrontation, gait is deferred, there is no nystagmus, patient also has difficulty following commands has to be coached through the neuro exam. Skin:  Skin is warm, dry and intact. No rash noted. Psychiatric: Mood and affect are normal. Speech and behavior are normal.  ____________________________________________   LABS (all labs  ordered are listed, but only abnormal results are displayed)  Labs Reviewed  GLUCOSE, CAPILLARY  PROTIME-INR  APTT  CBC  DIFFERENTIAL  COMPREHENSIVE METABOLIC PANEL  TROPONIN I  AMMONIA  CBG MONITORING, ED   ____________________________________________  EKG  I personally interpreted any EKGs ordered by me or triage Sinus tach rate 109 and no acute ST elevation or acute ST depression, nonspecific ST changes ____________________________________________  RADIOLOGY  I reviewed any imaging ordered by me or triage that were performed during my shift and, if possible, patient and/or family made aware of any abnormal findings. ____________________________________________   PROCEDURES  Procedure(s) performed: None  Procedures  Critical Care performed: None  ____________________________________________   INITIAL IMPRESSION / ASSESSMENT AND PLAN / ED  COURSE  Pertinent labs & imaging results that were available during my care of the patient were reviewed by me and considered in my medical decision making (see chart for details).  Patient with headache and neurologic problems after a biopsy on Friday. The headache is moderate at this time. She has no fever or stiff neck to suggest meningitis. I am concerned that she is also having neurologic symptoms. This certainly could be consistent with a stroke especially as the patient had a recent hypertensive episode. We are going to obtain CT of the head, we will do a bedside swallow study and if she passes we'll give her Tylenol for her headache. We will obtain liver function profile and ammonia level, as well as generalized workup including sodium as she has had low sodium in the past. We will get coags in case there is a bleed, and we will watch her closely pending CT. Patient is not a candidate for TPA for her  wheeze difficulties because she is well outside the window. Symptoms started she believes on Friday.  ----------------------------------------- 8:39 AM on 11/10/2016 -----------------------------------------  ----------------------------------------- 8:45 AM on 11/10/2016 -----------------------------------------  Discussed with Dr. Rogue Bussing, after obtaining CT scan which shows likely edema from presumed metastases to the brain. He agrees with Decadron. He states with multiple metastases to the liver and also metastases to the brain patient is not likely a surgical candidate. He does not feel therefore she should be transferred to a tertiary care center. He does ask me to admit her to the hospitalist service for medical management and he will consult.    ____________________________________________   FINAL CLINICAL IMPRESSION(S) / ED DIAGNOSES  Final diagnoses:  None      This chart was dictated using voice recognition software.  Despite best efforts to proofread,  errors can  occur which can change meaning.      Schuyler Amor, MD 11/10/16 [place], MD 11/10/16 [id number]

## 2016-11-10 NOTE — ED Notes (Signed)
Pt being transported to MRI by Loma Sender

## 2016-11-11 ENCOUNTER — Other Ambulatory Visit: Payer: Self-pay | Admitting: Pathology

## 2016-11-11 ENCOUNTER — Ambulatory Visit: Payer: BLUE CROSS/BLUE SHIELD | Attending: Radiation Oncology | Admitting: Radiation Oncology

## 2016-11-11 ENCOUNTER — Ambulatory Visit
Admit: 2016-11-11 | Discharge: 2016-11-11 | Disposition: A | Discharge status: Home / Self Care | Payer: BLUE CROSS/BLUE SHIELD | Attending: Radiation Oncology | Admitting: Radiation Oncology

## 2016-11-11 ENCOUNTER — Inpatient Hospital Stay: Payer: BLUE CROSS/BLUE SHIELD

## 2016-11-11 DIAGNOSIS — Z170 Estrogen receptor positive status [ER+]: Secondary | ICD-10-CM | POA: Insufficient documentation

## 2016-11-11 DIAGNOSIS — C799 Secondary malignant neoplasm of unspecified site: Secondary | ICD-10-CM

## 2016-11-11 DIAGNOSIS — C7931 Secondary malignant neoplasm of brain: Principal | ICD-10-CM

## 2016-11-11 DIAGNOSIS — Z510 Encounter for antineoplastic radiation therapy: Secondary | ICD-10-CM | POA: Diagnosis not present

## 2016-11-11 DIAGNOSIS — C787 Secondary malignant neoplasm of liver and intrahepatic bile duct: Secondary | ICD-10-CM

## 2016-11-11 DIAGNOSIS — Z808 Family history of malignant neoplasm of other organs or systems: Secondary | ICD-10-CM

## 2016-11-11 DIAGNOSIS — Z7189 Other specified counseling: Secondary | ICD-10-CM

## 2016-11-11 DIAGNOSIS — C50412 Malignant neoplasm of upper-outer quadrant of left female breast: Secondary | ICD-10-CM | POA: Insufficient documentation

## 2016-11-11 DIAGNOSIS — Z9221 Personal history of antineoplastic chemotherapy: Secondary | ICD-10-CM

## 2016-11-11 DIAGNOSIS — F17210 Nicotine dependence, cigarettes, uncomplicated: Secondary | ICD-10-CM

## 2016-11-11 DIAGNOSIS — Z515 Encounter for palliative care: Secondary | ICD-10-CM

## 2016-11-11 DIAGNOSIS — Z803 Family history of malignant neoplasm of breast: Secondary | ICD-10-CM

## 2016-11-11 DIAGNOSIS — Z79811 Long term (current) use of aromatase inhibitors: Secondary | ICD-10-CM

## 2016-11-11 DIAGNOSIS — I252 Old myocardial infarction: Secondary | ICD-10-CM

## 2016-11-11 DIAGNOSIS — G936 Cerebral edema: Secondary | ICD-10-CM

## 2016-11-11 DIAGNOSIS — E785 Hyperlipidemia, unspecified: Secondary | ICD-10-CM

## 2016-11-11 DIAGNOSIS — C50912 Malignant neoplasm of unspecified site of left female breast: Secondary | ICD-10-CM

## 2016-11-11 DIAGNOSIS — I10 Essential (primary) hypertension: Secondary | ICD-10-CM

## 2016-11-11 DIAGNOSIS — Z79899 Other long term (current) drug therapy: Secondary | ICD-10-CM

## 2016-11-11 MED ORDER — TECHNETIUM TC 99M-LABELED RED BLOOD CELLS IV KIT
19.2260 | PACK | Freq: Once | INTRAVENOUS | Status: AC | PRN
  Administered 2016-11-11: 13:00:00 19.226 via INTRAVENOUS

## 2016-11-11 NOTE — Plan of Care (Signed)
Problem: Education: Goal: Knowledge of [facility] Education information/materials will improve Outcome: Progressing BP 99/62 @ 2013, scheduled carvedilol held, Dr. Jannifer Franklin paged.  VSS otherwise.  Free of falls during shift.  Reported HA 5/10, received PRN Tylenol '650mg'$  x1, improved to 4/10, no further intervention needed.  Family at bedside, call bell within reach.  WCTM.

## 2016-11-11 NOTE — Care Management Note (Signed)
Case Management Note  Patient Details  Name: [name] MRN: [id number] Date of Birth: 03/28/1956  Action/Plan:  Patient with known metastatic breast cancern with metastasis to the liver presents with headache.  now diagnosed with  metastasis to the brain.  Oncology consulting.  Palliative care consult is pending.  Currently full code  Expected Discharge Date:                  Expected Discharge Plan:     In-House Referral:     Discharge planning Services  CM Consult  Post Acute Care Choice:    Choice offered to:     DME Arranged:    DME Agency:     HH Arranged:    HH Agency:     Status of Service:  In process, will continue to follow  If discussed at Long Length of Stay Meetings, dates discussed:    Additional Comments:  Katrina Stack, RN 11/11/2016, 9:04 AM

## 2016-11-11 NOTE — Consult Note (Signed)
NEW PATIENT EVALUATION  Name: Sarah Horne  MRN: [id number]  Date:   11/10/2016     DOB: 1956-03-21   This 61 y.o. female patient presents to the clinic for initial evaluation of solitary brain metastasis from known stage IV breast cancer.  REFERRING PHYSICIAN: No ref. provider found  CHIEF COMPLAINT:  Chief Complaint  Patient presents with  . Headache    DIAGNOSIS: The primary encounter diagnosis was Cerebral edema ([place]). Diagnoses of Metastasis ([place]) and Cardiomyopathy secondary to chemotherapy [facility]) were also pertinent to this visit.   PREVIOUS INVESTIGATIONS:  MRI scan and CT scans reviewed Liver biopsy pending Clinical notes reviewed  HPI: Patient is a 61 year old female well known to our department having been treated over a year ago for stage II (T2 N1 M0) triple positive invasive mammary carcinoma of the left breast status post neoadjuvant chemotherapy plus Herceptin and received whole breast and peripheral lymphatic radiation. She has been treated withneratinib although recent liver function tests have been elevated and imaging by CT scan shows multiple liver lesions compatible with metastatic disease. She is had a biopsy of her liver with results pending. She's also presented recently with increasing headaches had a CT scan followed by an MRI scan showing left temporal solitary lesion compatible with brain metastasis. She also had significant edema has been started on Decadron. She has some 8 aphasia although this is improved with steroids. She has no focal motor or sensory level. I been asked to evaluate her for possible radiation therapy to her brain.  PLANNED TREATMENT REGIMEN: Hypofractionated course of radiation to solitary brain metastasis  PAST MEDICAL HISTORY:  has a past medical history of Breast cancer ([place]) (2016); Cancer ([place]) (2016); Chemotherapy-induced peripheral neuropathy ([place]); Heart attack (1998); Hyperlipidemia; Hypertension; Neuromuscular disorder ([place]);  and Neuropathy ([place]).    PAST SURGICAL HISTORY:  Past Surgical History:  Procedure Laterality Date  . BREAST BIOPSY Left 2016   Positive  . BREAST LUMPECTOMY WITH SENTINEL LYMPH NODE BIOPSY Left 05/23/2015   Procedure: LEFT BREAST WIDE EXCISION WITH AXILLARY DISSECTION, MASTOPLASTY ;  Surgeon: [name], MD;  Location: [facility] ORS;  Service: General;  Laterality: Left;  . BREAST SURGERY Left 12/18/14   breast biopsy/INVASIVE DUCTAL CARCINOMA OF BREAST, NOTTINGHAM GRADE 2.  Marland Kitchen BREAST SURGERY  05/23/2015.   Wide excision/mastoplasty, axillary dissection. No residual invasive cancer, positive for residual DCIS. 0/2 nodes identified on axillary dissection. (no SLN by technetium or methylene blue)  . CARDIAC CATHETERIZATION    . PORTACATH PLACEMENT Right 12-31-14   Dr Bary Castilla    FAMILY HISTORY: family history includes Brain cancer in her maternal uncle; Breast cancer in her cousin and maternal aunt.  SOCIAL HISTORY:  reports that she has been smoking Cigarettes.  She has a 9.00 pack-year smoking history. She has never used smokeless tobacco. She reports that she does not drink alcohol or use drugs.  ALLERGIES: No known allergies  MEDICATIONS:  Current Facility-Administered Medications  Medication Dose Route Frequency Provider Last Rate Last Dose  . 0.9 %  sodium chloride infusion  250 mL Intravenous PRN Hillary Bow, MD      . acetaminophen (TYLENOL) tablet 650 mg  650 mg Oral Q6H PRN Hillary Bow, MD   650 mg at 11/10/16 2038   Or  . acetaminophen (TYLENOL) suppository 650 mg  650 mg Rectal Q6H PRN Srikar Sudini, MD      . albuterol (PROVENTIL) (2.5 MG/3ML) 0.083% nebulizer solution 2.5 mg  2.5 mg Nebulization Q2H PRN Srikar  Sudini, MD      . anastrozole (ARIMIDEX) tablet 1 mg  1 mg Oral Daily Srikar Sudini, MD   1 mg at 11/11/16 1002  . carvedilol (COREG) tablet 6.25 mg  6.25 mg Oral BID Hillary Bow, MD   6.25 mg at 11/11/16 1001  . dexamethasone (DECADRON) injection 4 mg  4 mg  Intravenous Q6H Srikar Sudini, MD   4 mg at 11/11/16 0620  . diphenoxylate-atropine (LOMOTIL) 2.5-0.025 MG per tablet 1 tablet  1 tablet Oral QID PRN Srikar Sudini, MD      . enoxaparin (LOVENOX) injection 40 mg  40 mg Subcutaneous Q24H Srikar Sudini, MD   40 mg at 11/11/16 1002  . ibuprofen (ADVIL,MOTRIN) tablet 600 mg  600 mg Oral Q6H PRN Srikar Sudini, MD      . ketorolac (TORADOL) 15 MG/ML injection 15 mg  15 mg Intravenous Q6H PRN Srikar Sudini, MD      . ondansetron (ZOFRAN) tablet 4 mg  4 mg Oral Q6H PRN Hillary Bow, MD       Or  . ondansetron (ZOFRAN) injection 4 mg  4 mg Intravenous Q6H PRN Srikar Sudini, MD      . polyethylene glycol (MIRALAX / GLYCOLAX) packet 17 g  17 g Oral Daily PRN Srikar Sudini, MD      . sodium chloride flush (NS) 0.9 % injection 3 mL  3 mL Intravenous Q12H Srikar Sudini, MD   3 mL at 11/11/16 1003  . sodium chloride flush (NS) 0.9 % injection 3 mL  3 mL Intravenous PRN Hillary Bow, MD       Facility-Administered Medications Ordered in Other Encounters  Medication Dose Route Frequency Provider Last Rate Last Dose  . 0.9 %  sodium chloride infusion   Intravenous Continuous [name], MD      . 0.9 %  sodium chloride infusion   Intravenous Continuous Lloyd Huger, MD 999 mL/hr at 04/24/15 1520    . heparin lock flush 100 unit/mL  500 Units Intravenous Once Dmitriy Berenzon, MD      . sodium chloride 0.9 % injection 10 mL  10 mL Intravenous PRN [name], MD   10 mL at 04/04/15 1440  . sodium chloride flush (NS) 0.9 % injection 10 mL  10 mL Intravenous PRN Dmitriy Berenzon, MD        ECOG PERFORMANCE STATUS:  1 - Symptomatic but completely ambulatory  REVIEW OF SYSTEMS: Except for headaches and a aphasia Patient denies any weight loss, fatigue, weakness, fever, chills or night sweats. Patient denies any loss of vision, blurred vision. Patient denies any ringing  of the ears or hearing loss. No irregular heartbeat. Patient denies heart murmur  or history of fainting. Patient denies any chest pain or pain radiating to her upper extremities. Patient denies any shortness of breath, difficulty breathing at night, cough or hemoptysis. Patient denies any swelling in the lower legs. Patient denies any nausea vomiting, vomiting of blood, or coffee ground material in the vomitus. Patient denies any stomach pain. Patient states has had normal bowel movements no significant constipation or diarrhea. Patient denies any dysuria, hematuria or significant nocturia. Patient denies any problems walking, swelling in the joints or loss of balance. Patient denies any skin changes, loss of hair or loss of weight. Patient denies any excessive worrying or anxiety or significant depression. Patient denies any problems with insomnia. Patient denies excessive thirst, polyuria, polydipsia. Patient denies any swollen glands, patient denies easy bruising or easy bleeding. Patient  denies any recent infections, allergies or URI. Patient "s visual fields have not changed significantly in recent time.    PHYSICAL EXAM: BP 124/89 (BP Location: Right Arm)   Pulse 79   Temp 98.5 F (36.9 C) (Oral)   Resp 18   Ht '5\' 6"'$  (1.676 m)   Wt 156 lb (70.8 kg)   SpO2 98%   BMI 25.18 kg/m  Neurologic examination is essentially unremarkable. Crude visual fields within normal range proprioception is intact Well-developed well-nourished patient in NAD. HEENT reveals PERLA, EOMI, discs not visualized.  Oral cavity is clear. No oral mucosal lesions are identified. Neck is clear without evidence of cervical or supraclavicular adenopathy. Lungs are clear to A&P. Cardiac examination is essentially unremarkable with regular rate and rhythm without murmur rub or thrill. Abdomen is benign with no organomegaly or masses noted. Motor sensory and DTR levels are equal and symmetric in the upper and lower extremities. Cranial nerves II through XII are grossly intact. Proprioception is intact. No  peripheral adenopathy or edema is identified. No motor or sensory levels are noted. Crude visual fields are within normal range.  LABORATORY DATA: Liver biopsy is pending    RADIOLOGY RESULTS: CT scan of chest abdomen pelvis and MRI scan of brain are all reviewed and compatible with the above-stated findings   IMPRESSION: Stage IV breast cancer with liver and brain metastasis in 61 year old female  PLAN: At this time like to go ahead with hypofractionated course of radiation therapy to her solitary brain metastasis. Would treat up to 3000 cGy in 5 fractions. I would plan on using MRI scan of fusion study for treatment planning. Risks and benefits of treatment including hair loss fatigue possibility of brain necrosis alteration of blood counts skin reaction all were discussed in detail with the patient and her son. They both seem to comprehend my treatment plan well. We will see the patient this afternoon for treatment planning. Like to start treatments early next week. I discussed the case personally with medical oncology.  I would like to take this opportunity to thank you for allowing me to participate in the care of your patient.Armstead Peaks., MD

## 2016-11-11 NOTE — Consult Note (Signed)
Consultation Note Date: 11/11/2016   Patient Name: Sarah Horne  DOB: 22-Sep-1956  MRN: [id number]  Age / Sex: 61 y.o., female  PCP: [name], MD Referring Physician: Hillary Bow, MD  Reason for Consultation: Establishing goals of care  HPI/Patient Profile: 61 y.o. female  with past medical history of breast cancer with mets to liver, neuropathy, hypertension, hyperlipidemia, and MI admitted on 11/10/2016 with headache and expressive aphasia. CT head reveals left frontoparietal mass with surrounding edema. MRI reveals metastatic deposit left temporal lobe with extensive surrounding edema and 66m right midline shift. Receiving IV decadron. Per Dr. BRogue Bussingand Dr. CDonella Stade the plan is to start radiation next week and chemotherapy in two weeks. Palliative medicine consultation for goals of care.   Clinical Assessment and Goals of Care: I have reviewed medical records, discussed with Dr. SDarvin Neighboursand Dr. BRogue Bussing met with patient and son at bedside to discuss diagnosis and goals of care.   Introduced Palliative Medicine as specialized medical care for people living with serious illness. It focuses on providing relief from the symptoms and stress of a serious illness. The goal is to improve quality of life for both the patient and the family.  Initially, patient tearful with flat affect. Patient lives independently. She has two adult children out of state. She has a supportive brother that lives close to her and available to assist. She is followed by Dr. BRogue Bussingoutpatient. Outside of the room, her son tells me his mom never told him about her cancer until this admission. She is most worried about keeping her job until she is able to retire in one year.    Discussed current hospitalization and metastatic cancer. Dr. CDonella Stadeevaluated her during our conversation and is hopeful to eliminate the brain  mass with radiation. After hearing this, patient changes her demeanor. She smiles and tells me she is "going to fight this." She does understand the liver mets is incurable but willing to pursue all recommendations by oncology and radiation oncology to extend life.     Advanced directives and concepts specific to code status were considered and discussed. At this point, patient would want all medical interventions in order to extend life, including resuscitation and life support. Remains FULL code. Encouraged she review advanced directive packet and consider a trusted decision-maker (POA) if she was not able to make her own decisions. Also, encouraged she begin conversations with family regarding EOL wishes.    Questions and concerns addressed. Offered emotional and spiritual support.    SUMMARY OF RECOMMENDATIONS    Discussed and educated on code status. Remains FULL code.  Strongly encouraged patient discuss with family advanced directives, code status, and EOL wishes.   Plan is radiation and chemotherapy in the next two weeks.   PMT will continue to shadow chart and support patient and family during hospitalization.   Code Status/Advance Care Planning:  Full code   Symptom Management:   Per attending  Palliative Prophylaxis:   Aspiration, Delirium Protocol and Frequent Pain Assessment  Additional Recommendations (Limitations, Scope, Preferences):  Full Scope Treatment  Psycho-social/Spiritual:   Desire for further Chaplaincy support:yes  Additional Recommendations: Caregiving  Support/Resources  Prognosis:   Unable to determine  Discharge Planning: To Be Determined  Likely home     Primary Diagnoses: Present on Admission: . Cerebral edema (HCC)   I have reviewed the medical record, interviewed the patient and family, and examined the patient. The following aspects are pertinent.  Past Medical History:  Diagnosis Date  . Breast cancer ([place]) 2016    Left-radiation  . Cancer ([place]) 2016   Stage II, ER positive, PR positive, HER-2/neu overexpressing of the left breast.  . Chemotherapy-induced peripheral neuropathy ([place])   . Heart attack 1998  . Hyperlipidemia   . Hypertension   . Neuromuscular disorder (HCC)    neuropathy  . Neuropathy [facility])    Social History   Social History  . Marital status: Divorced    Spouse name: N/A  . Number of children: N/A  . Years of education: N/A   Social History Main Topics  . Smoking status: Current Every Day Smoker    Packs/day: 0.50    Years: 18.00    Types: Cigarettes  . Smokeless tobacco: Never Used  . Alcohol use No  . Drug use: No  . Sexual activity: Not Asked   Other Topics Concern  . None   Social History Narrative  . None   Family History  Problem Relation Age of Onset  . Breast cancer Maternal Aunt   . Breast cancer Cousin   . Brain cancer Maternal Uncle    Scheduled Meds: . anastrozole  1 mg Oral Daily  . carvedilol  6.25 mg Oral BID  . dexamethasone  4 mg Intravenous Q6H  . enoxaparin (LOVENOX) injection  40 mg Subcutaneous Q24H  . sodium chloride flush  3 mL Intravenous Q12H   Continuous Infusions: PRN Meds:.sodium chloride, acetaminophen **OR** acetaminophen, albuterol, diphenoxylate-atropine, ibuprofen, ketorolac, ondansetron **OR** ondansetron (ZOFRAN) IV, polyethylene glycol, sodium chloride flush Medications Prior to Admission:  Prior to Admission medications   Medication Sig Start Date End Date Taking? Authorizing Provider  anastrozole (ARIMIDEX) 1 MG tablet TAKE 1 TABLET DAILY 11/04/16  Yes Cammie Sickle, MD  aspirin 81 MG tablet Take 81 mg by mouth daily.   Yes Historical Provider, MD  carvedilol (COREG) 6.25 MG tablet Take 1 tablet (6.25 mg total) by mouth 2 (two) times daily. 05/07/16  Yes [name], MD  lisinopril (PRINIVIL,ZESTRIL) 10 MG tablet TAKE 1 TABLET DAILY 09/30/16  Yes [name], MD  diphenoxylate-atropine (LOMOTIL) 2.5-0.025  MG tablet Take 1 tablet by mouth 4 (four) times daily as needed for diarrhea or loose stools. Take it along with immodium 09/07/16   Cammie Sickle, MD  DULoxetine (CYMBALTA) 60 MG capsule TAKE 1 CAPSULE (60 MG TOTAL) BY MOUTH DAILY. Patient not taking: Reported on 10/27/2016 10/21/16   Cammie Sickle, MD  potassium chloride SA (K-DUR,KLOR-CON) 20 MEQ tablet 1 pill twice a day Patient not taking: Reported on 11/10/2016 10/28/16   Cammie Sickle, MD   Allergies  Allergen Reactions  . No Known Allergies    Review of Systems  Neurological: Positive for speech difficulty and headaches.   Physical Exam  Constitutional: She is oriented to person, place, and time. She appears well-developed and well-nourished. She is cooperative.  Cardiovascular: Regular rhythm.   Pulmonary/Chest: Effort normal and breath sounds normal.  Abdominal: Normal appearance.  Neurological: She is alert  and oriented to person, place, and time.  Skin: Skin is warm and dry.  Psychiatric: Her speech is normal. She is withdrawn. Cognition and memory are normal.  Nursing note and vitals reviewed.  Vital Signs: BP 122/70 (BP Location: Right Arm)   Pulse 72   Temp 98.5 F (36.9 C) (Oral)   Resp 20   Ht 5' 6"  (1.676 m)   Wt 70.8 kg (156 lb)   SpO2 98%   BMI 25.18 kg/m  Pain Assessment: 0-10   Pain Score: 4   SpO2: SpO2: 98 % O2 Device:SpO2: 98 % O2 Flow Rate: .   IO: Intake/output summary:   Intake/Output Summary (Last 24 hours) at 11/11/16 2876 Last data filed at 11/10/16 2130  Gross per 24 hour  Intake                3 ml  Output              600 ml  Net             -597 ml    LBM: Last BM Date: 11/09/16 Baseline Weight: Weight: 70.8 kg (156 lb) Most recent weight: Weight: 70.8 kg (156 lb)     Palliative Assessment/Data: PPS 60%   Flowsheet Rows   Flowsheet Row Most Recent Value  Intake Tab  Referral Department  Hospitalist  Unit at Time of Referral  ER  Palliative Care Primary  Diagnosis  Cancer  Date Notified  11/10/16  Palliative Care Type  New Palliative care  Reason for referral  Clarify Goals of Care, Psychosocial or Spiritual support  Date of Admission  11/10/16  Date first seen by Palliative Care  11/11/16  # of days IP prior to Palliative referral  0  Clinical Assessment  Palliative Performance Scale Score  60%  Psychosocial & Spiritual Assessment  Palliative Care Outcomes  Patient/Family meeting held?  Yes  Who was at the meeting?  patient and son  Palliative Care Outcomes  Clarified goals of care, ACP counseling assistance, Provided psychosocial or spiritual support     Time In: 0800 Time Out: 0910 Time Total: 49mn Greater than 50%  of this time was spent counseling and coordinating care related to the above assessment and plan.  Signed by:  MIhor Dow FNP-C Palliative Medicine Team  Phone: [phone]: [phone]  Please contact Palliative Medicine Team phone at [phone] questions and concerns.  For individual provider: See AShea Evans

## 2016-11-11 NOTE — Progress Notes (Signed)
[place] [place] NAME: Sarah Horne    MR#:  [id number]  DATE OF BIRTH:  1955/10/22  SUBJECTIVE:  CHIEF COMPLAINT:   Chief Complaint  Patient presents with  . Headache   Headache today. Still has mild expressive aphasia. Ambulating well. No focal weakness or numbness. Febrile.  REVIEW OF SYSTEMS:    Review of Systems  Constitutional: Negative for chills and fever.  HENT: Negative for sore throat.   Eyes: Negative for blurred vision, double vision and pain.  Respiratory: Negative for cough, hemoptysis, shortness of breath and wheezing.   Cardiovascular: Negative for chest pain, palpitations, orthopnea and leg swelling.  Gastrointestinal: Negative for abdominal pain, constipation, diarrhea, heartburn, nausea and vomiting.  Genitourinary: Negative for dysuria and hematuria.  Musculoskeletal: Negative for back pain and joint pain.  Skin: Negative for rash.  Neurological: Positive for speech change. Negative for sensory change, focal weakness and headaches.  Endo/Heme/Allergies: Does not bruise/bleed easily.  Psychiatric/Behavioral: Negative for depression. The patient is not nervous/anxious.     DRUG ALLERGIES:   Allergies  Allergen Reactions  . No Known Allergies     VITALS:  Blood pressure 124/89, pulse 79, temperature 98.5 F (36.9 C), temperature source Oral, resp. rate 18, height '5\' 6"'$  (1.676 m), weight 70.8 kg (156 lb), SpO2 98 %.  PHYSICAL EXAMINATION:   Physical Exam  GENERAL:  61 y.o.-year-old patient lying in the bed with no acute distress.  EYES: Pupils equal, round, reactive to light and accommodation. No scleral icterus. Extraocular muscles intact.  HEENT: Head atraumatic, normocephalic. Oropharynx and nasopharynx clear.  NECK:  Supple, no jugular venous distention. No thyroid enlargement, no tenderness.  LUNGS: Normal breath sounds bilaterally, no wheezing, rales, rhonchi. No use of accessory muscles of  respiration.  CARDIOVASCULAR: S1, S2 normal. No murmurs, rubs, or gallops.  ABDOMEN: Soft, nontender, nondistended. Bowel sounds present. No organomegaly or mass.  EXTREMITIES: No cyanosis, clubbing or edema b/l.    NEUROLOGIC: Cranial nerves II through XII are intact. No focal Motor or sensory deficits b/l.  Mild expressive aphasia PSYCHIATRIC: The patient is alert and oriented x 3. Peripheral SKIN: No obvious rash, lesion, or ulcer.   LABORATORY PANEL:   CBC  Recent Labs Lab 11/10/16 0749  WBC 5.1  HGB 14.1  HCT 40.3  PLT 265   ------------------------------------------------------------------------------------------------------------------ Chemistries   Recent Labs Lab 11/10/16 0749  NA 136  K 4.1  CL 99*  CO2 27  GLUCOSE 89  BUN 11  CREATININE 1.04*  CALCIUM 8.8*  AST 133*  ALT 96*  ALKPHOS 269*  BILITOT 1.2   ------------------------------------------------------------------------------------------------------------------  Cardiac Enzymes  Recent Labs Lab 11/10/16 0749  TROPONINI <0.03   ------------------------------------------------------------------------------------------------------------------  RADIOLOGY:  Ct Head Wo Contrast  Result Date: 11/10/2016 CLINICAL DATA:  Headache. EXAM: CT HEAD WITHOUT CONTRAST TECHNIQUE: Contiguous axial images were obtained from the base of the skull through the vertex without intravenous contrast. COMPARISON:  None. FINDINGS: Brain: Ventricular size is within normal limits. There is no evidence of hemorrhage. White matter edema is noted in the left temporal and parietal lobes concerning for underlying mass lesion. There is approximately 7 mm of left-to-right midline shift. Vascular: No hyperdense vessel or unexpected calcification. Skull: Normal. Negative for fracture or focal lesion. Sinuses/Orbits: No acute finding. Other: None. IMPRESSION: White matter edema is noted in the left temporal and parietal lobes concerning  for underlying mass lesion. MRI of the brain with and without gadolinium administration is recommended for  further evaluation. Electronically Signed   By: Marijo Conception, M.D.   On: 11/10/2016 08:25   Mr Brain W And Wo Contrast  Addendum Date: 11/10/2016   ADDENDUM REPORT: 11/10/2016 13:24 ADDENDUM: Sagittal T1 postcontrast was inadvertently not performed. The patient returned approximately 90 minutes later for this sequence. Sagittal T1 postcontrast imaging reveals solitary left temporal enhancing lesion, no additional metastatic deposits in the brain. Electronically Signed   By: Franchot Gallo M.D.   On: 11/10/2016 13:24   Result Date: 11/10/2016 CLINICAL DATA:  Metastatic breast cancer.  Brain mass on CT. EXAM: MRI HEAD WITHOUT AND WITH CONTRAST TECHNIQUE: Multiplanar, multiecho pulse sequences of the brain and surrounding structures were obtained without and with intravenous contrast. CONTRAST:  16m MULTIHANCE GADOBENATE DIMEGLUMINE 529 MG/ML IV SOLN COMPARISON:  CT head 11/10/2016 FINDINGS: Brain: Enhancing mass lesion in the left mid temporal lobe measures 19 x 18 mm and is consistent with metastatic disease. There is extensive vasogenic edema surrounding the mass lesion. Mild shift of the midline structures measuring 5 mm to the right. Negative for hemorrhage. No other enhancing metastatic deposits identified. Ventricles normal in size. Negative for acute infarct. Mild chronic microvascular ischemic change in the cerebral white matter and pons. Vascular: Normal arterial flow voids. Hypoplastic basilar due to fetal origin of the posterior cerebral artery bilaterally. Skull and upper cervical spine: No skeletal lesions identified. Sinuses/Orbits: Negative none Other: None IMPRESSION: Metastatic deposit left temporal lobe with extensive surrounding edema and 5 mm midline shift to the right. The mass measures 19 x 18 mm. No other metastatic deposits identified in the brain. Electronically Signed: By:  CFranchot GalloM.D. On: 11/10/2016 12:23     ASSESSMENT AND PLAN:    * Brain mets with  primary breast cancer along with Liver mets Surrounding edema with aphasia Started decadron MRI done Discussed with Dr. BRogue BussingRadiation oncology has seen the patient. Continue IV Decadron. Likely discharge tomorrow for outpatient follow-up with radiation oncology and oncology.  * HTN Home meds  * DVT prophylaxis Lovenox  All the records are reviewed and case discussed with Care Management/Social Workerr. Management plans discussed with the patient, family and they are in agreement.  CODE STATUS: FULL CODE  DVT Prophylaxis: SCDs  TOTAL TIME TAKING CARE OF THIS PATIENT: 30 minutes.   POSSIBLE D/C IN 1-2 DAYS, DEPENDING ON CLINICAL CONDITION.  SHillary BowR M.D on 11/11/2016 at 10:37 AM  Between 7am to 6pm - Pager - [phone]  After 6pm go to [email or web address] - password [email or web address]  Office  [phone] CC: Primary care physician; SKeith Rake MD  Note: This dictation was prepared with Dragon dictation along with smaller phrase technology. Any transcriptional errors that result from this process are unintentional.

## 2016-11-11 NOTE — Progress Notes (Addendum)
SLP Cancellation Note  Patient Details Name: Sarah Horne MRN: [id number] DOB: 1956-01-17   Cancelled treatment:       Reason Eval/Treat Not Completed: SLP screened, no needs identified, will sign off (Screen completed. NSG consulted; Palliative care following). Met w/ pt who described that her speech was "fine" and stated "I can talk ok" but did acknowledge an inconsistent word finding issue when talking about the new information she has received about "a spot on my liver and my brain" recently post a biopsy; MDs have indicated she would be receiving tx for the Left frontoparietal brain mass at discharge. Pt denied significant deficits. Pt was able to verbally communicate w/ SLP(and Palliative Care) in an adequate manner and slowed down intermittently to take her time. Brief education given on this strategy as well as others including seeking Outpatient Speech Tx if needed during/post her Ca txs - suspect pt could have impact on her language/communication from the placement of the brain mass. Pt agreed to seek consult from MD during upcoming tx if needed. NSG updated and will reconsult ST services if any decline in status while admitted.    Orinda Kenner, MS, CCC-SLP Indyah Saulnier 11/11/2016, 10:05 AM

## 2016-11-11 NOTE — Progress Notes (Signed)
[place] CONSULT NOTE  Patient Care Team: [name], MD as PCP - General (Family Medicine) [name], MD (General Surgery) [name], MD (Inactive) as Attending Physician (Internal Medicine)  CHIEF COMPLAINTS/PURPOSE OF CONSULTATION: Metastatic breast cancer brain metastases   HISTORY OF PRESENTING ILLNESS:  Sarah Horne 61 y.o.  female patient with a history of ER/PR positive HER-2/neu positive as cancer initially stage II- most recently had been on neratinib noted to have elevated liver function. This led to further imaging including a CT of the chest and pelvis that showed multiple liver lesions. Patient had a liver biopsy positive for breast cancer.  Patient noted to have worsening headaches difficulty with word finding and memory problems; patient had a CT scan done in the emergency room that showed left temporal lobe lesion. A follow-up MRI of the brain with contrast showed left temporal lobe lesion with significant edema. No other metastases noted in the brain. Patient is currently on IV steroids.  Patient continues to have difficulty with her word finding/aphasia. Denies any weakness in any particular hand or leg. Denies having any seizures. Denies any pain. She is accompanied by her son.  ROS: A complete 10 point review of system is done which is negative except mentioned above in history of present illness  MEDICAL HISTORY:  Past Medical History:  Diagnosis Date  . Breast cancer ([place]) 2016   Left-radiation  . Cancer ([place]) 2016   Stage II, ER positive, PR positive, HER-2/neu overexpressing of the left breast.  . Chemotherapy-induced peripheral neuropathy ([place])   . Heart attack 1998  . Hyperlipidemia   . Hypertension   . Neuromuscular disorder ([facility])    neuropathy  . Neuropathy ([place])     SURGICAL HISTORY: Past Surgical History:  Procedure Laterality Date  . BREAST BIOPSY Left 2016   Positive  . BREAST LUMPECTOMY WITH SENTINEL  LYMPH NODE BIOPSY Left 05/23/2015   Procedure: LEFT BREAST WIDE EXCISION WITH AXILLARY DISSECTION, MASTOPLASTY ;  Surgeon: [name], MD;  Location: [facility] ORS;  Service: General;  Laterality: Left;  . BREAST SURGERY Left 12/18/14   breast biopsy/INVASIVE DUCTAL CARCINOMA OF BREAST, NOTTINGHAM GRADE 2.  Marland Kitchen BREAST SURGERY  05/23/2015.   Wide excision/mastoplasty, axillary dissection. No residual invasive cancer, positive for residual DCIS. 0/2 nodes identified on axillary dissection. (no SLN by technetium or methylene blue)  . CARDIAC CATHETERIZATION    . PORTACATH PLACEMENT Right 12-31-14   Dr Bary Castilla    SOCIAL HISTORY: Social History   Social History  . Marital status: Divorced    Spouse name: N/A  . Number of children: N/A  . Years of education: N/A   Occupational History  . Not on file.   Social History Main Topics  . Smoking status: Current Every Day Smoker    Packs/day: 0.50    Years: 18.00    Types: Cigarettes  . Smokeless tobacco: Never Used  . Alcohol use No  . Drug use: No  . Sexual activity: Not on file   Other Topics Concern  . Not on file   Social History Narrative  . No narrative on file    FAMILY HISTORY: Family History  Problem Relation Age of Onset  . Breast cancer Maternal Aunt   . Breast cancer Cousin   . Brain cancer Maternal Uncle     ALLERGIES:  is allergic to no known allergies.  MEDICATIONS:  Current Facility-Administered Medications  Medication Dose Route Frequency Provider Last Rate Last  Dose  . 0.9 %  sodium chloride infusion  250 mL Intravenous PRN Hillary Bow, MD      . acetaminophen (TYLENOL) tablet 650 mg  650 mg Oral Q6H PRN Hillary Bow, MD   650 mg at 11/10/16 2038   Or  . acetaminophen (TYLENOL) suppository 650 mg  650 mg Rectal Q6H PRN Srikar Sudini, MD      . albuterol (PROVENTIL) (2.5 MG/3ML) 0.083% nebulizer solution 2.5 mg  2.5 mg Nebulization Q2H PRN Srikar Sudini, MD      . anastrozole (ARIMIDEX) tablet 1 mg  1 mg  Oral Daily Srikar Sudini, MD   1 mg at 11/10/16 1347  . carvedilol (COREG) tablet 6.25 mg  6.25 mg Oral BID Hillary Bow, MD   Stopped at 11/10/16 2130  . dexamethasone (DECADRON) injection 4 mg  4 mg Intravenous Q6H Srikar Sudini, MD   4 mg at 11/11/16 0620  . diphenoxylate-atropine (LOMOTIL) 2.5-0.025 MG per tablet 1 tablet  1 tablet Oral QID PRN Srikar Sudini, MD      . enoxaparin (LOVENOX) injection 40 mg  40 mg Subcutaneous Q24H Hillary Bow, MD   40 mg at 11/10/16 1347  . ibuprofen (ADVIL,MOTRIN) tablet 600 mg  600 mg Oral Q6H PRN Srikar Sudini, MD      . ketorolac (TORADOL) 15 MG/ML injection 15 mg  15 mg Intravenous Q6H PRN Srikar Sudini, MD      . ondansetron (ZOFRAN) tablet 4 mg  4 mg Oral Q6H PRN Hillary Bow, MD       Or  . ondansetron (ZOFRAN) injection 4 mg  4 mg Intravenous Q6H PRN Srikar Sudini, MD      . polyethylene glycol (MIRALAX / GLYCOLAX) packet 17 g  17 g Oral Daily PRN Srikar Sudini, MD      . sodium chloride flush (NS) 0.9 % injection 3 mL  3 mL Intravenous Q12H Srikar Sudini, MD   3 mL at 11/10/16 2130  . sodium chloride flush (NS) 0.9 % injection 3 mL  3 mL Intravenous PRN Hillary Bow, MD       Facility-Administered Medications Ordered in Other Encounters  Medication Dose Route Frequency Provider Last Rate Last Dose  . 0.9 %  sodium chloride infusion   Intravenous Continuous [name], MD      . 0.9 %  sodium chloride infusion   Intravenous Continuous Lloyd Huger, MD 999 mL/hr at 04/24/15 1520    . heparin lock flush 100 unit/mL  500 Units Intravenous Once Dmitriy Berenzon, MD      . sodium chloride 0.9 % injection 10 mL  10 mL Intravenous PRN [name], MD   10 mL at 04/04/15 1440  . sodium chloride flush (NS) 0.9 % injection 10 mL  10 mL Intravenous PRN Dmitriy Berenzon, MD          .  PHYSICAL EXAMINATION:  Vitals:   11/10/16 2013 11/11/16 0429  BP: 99/62 122/70  Pulse: 67 72  Resp: 19 20  Temp: 98 F (36.7 C) 98.5 F (36.9 C)    Filed Weights   11/10/16 0630 11/10/16 1213 11/11/16 0453  Weight: 156 lb (70.8 kg) 157 lb 3.2 oz (71.3 kg) 156 lb (70.8 kg)    GENERAL: Well-nourished well-developed; Alert, no distress and comfortable.   With her son.  EYES: no pallor or icterus OROPHARYNX: no thrush or ulceration. NECK: supple, no masses felt LYMPH:  no palpable lymphadenopathy in the cervical, axillary or inguinal regions LUNGS: decreased breath  sounds to auscultation at bases and  No wheeze or crackles HEART/CVS: regular rate & rhythm and no murmurs; No lower extremity edema ABDOMEN: abdomen soft, non-tender and normal bowel sounds Musculoskeletal:no cyanosis of digits and no clubbing  PSYCH: alert & oriented x 3; word finding difficulty noted.  NEURO: no focal motor/sensory deficits SKIN:  no rashes or significant lesions  LABORATORY DATA:  I have reviewed the data as listed Lab Results  Component Value Date   WBC 5.1 11/10/2016   HGB 14.1 11/10/2016   HCT 40.3 11/10/2016   MCV 92.8 11/10/2016   PLT 265 11/10/2016    Recent Labs  09/16/16 1055  10/19/16 1525 10/26/16 0825 10/27/16 1138 10/30/16 1540 11/10/16 0749  NA  --   < >  --   --  137 136 136  K  --   < >  --   --  3.6 3.5 4.1  CL  --   < >  --   --  101 101 99*  CO2  --   < >  --   --  27 25 27   GLUCOSE  --   < >  --   --  93 87 89  BUN  --   < >  --   --  13 17 11   CREATININE  --   < >  --   --  0.93 0.88 1.04*  CALCIUM  --   < >  --   --  9.0 9.0 8.8*  GFRNONAA  --   < >  --   --  >60 >60 56*  GFRAA  --   < >  --   --  >60 >60 >60  PROT 7.1  < > 7.8 7.8 7.9 7.6 7.3  ALBUMIN 3.7  < > 4.0 3.9 3.8 3.8 3.7  AST 68*  < > 130* 145* 120* 133* 133*  ALT 67*  < > 138* 137* 125* 128* 96*  ALKPHOS 133*  < > 291* 304* 284* 284* 269*  BILITOT 0.7  < > 0.8 1.1 0.7 0.9 1.2  BILIDIR <0.1*  --  0.2 <0.1*  --   --   --   IBILI NOT CALCULATED  --  0.6 NOT CALCULATED  --   --   --   < > = values in this interval not displayed.  RADIOGRAPHIC  STUDIES: I have personally reviewed the radiological images as listed and agreed with the findings in the report. Ct Head Wo Contrast  Result Date: 11/10/2016 CLINICAL DATA:  Headache. EXAM: CT HEAD WITHOUT CONTRAST TECHNIQUE: Contiguous axial images were obtained from the base of the skull through the vertex without intravenous contrast. COMPARISON:  None. FINDINGS: Brain: Ventricular size is within normal limits. There is no evidence of hemorrhage. White matter edema is noted in the left temporal and parietal lobes concerning for underlying mass lesion. There is approximately 7 mm of left-to-right midline shift. Vascular: No hyperdense vessel or unexpected calcification. Skull: Normal. Negative for fracture or focal lesion. Sinuses/Orbits: No acute finding. Other: None. IMPRESSION: White matter edema is noted in the left temporal and parietal lobes concerning for underlying mass lesion. MRI of the brain with and without gadolinium administration is recommended for further evaluation. Electronically Signed   By: Marijo Conception, M.D.   On: 11/10/2016 08:25   Ct Chest W Contrast  Result Date: 11/02/2016 CLINICAL DATA:  Left upper outer quadrant breast carcinoma. Elevated liver function tests. Liver masses seen on recent  ultrasound. EXAM: CT CHEST, ABDOMEN, AND PELVIS WITH CONTRAST TECHNIQUE: Multidetector CT imaging of the chest, abdomen and pelvis was performed following the standard protocol during bolus administration of intravenous contrast. CONTRAST:  100 mL Isovue-300 COMPARISON:  Ultrasound on 10/26/2016 FINDINGS: CT CHEST FINDINGS Cardiovascular: No acute findings. Mediastinum/Lymph Nodes: No masses or pathologically enlarged lymph nodes identified. Previous left mastectomy. No evidence of axillary lymphadenopathy. Lungs/Pleura: Mild emphysema. Post radiation changes noted in left upper lobe. A 9 mm irregular nodular density is seen in the inferior aspect of the right upper lobe on image 70/6. This  does not have typical appearance of pulmonary metastasis, and differential diagnosis includes inflammatory or infectious etiology and small bronchogenic carcinoma. No evidence of pleural effusion. Musculoskeletal:  No suspicious bone lesions identified. CT ABDOMEN AND PELVIS FINDINGS Hepatobiliary: Numerous diffuse low-attenuation masses are seen throughout the right and left hepatic lobes, consistent with diffuse liver metastases. Index mass in segment 8 of the right lobe measures 6.6 x 5.2 cm on image 50/2. Gallbladder is unremarkable. Pancreas:  No mass or inflammatory changes. Spleen:  Within normal limits in size and appearance. Adrenals/Urinary tract:  No masses or hydronephrosis. Stomach/Bowel: No evidence of obstruction, inflammatory process, or abnormal fluid collections. Vascular/Lymphatic: No pathologically enlarged lymph nodes identified. No abdominal aortic aneurysm. Aortic atherosclerosis. Reproductive: Calcified uterine fibroids seen, largest measuring approximately 4 cm. No adnexal mass or free fluid identified. Other:  None. Musculoskeletal: Ill-defined sclerotic bone lesions seen in the T12 vertebral body and right inferior pubic ramus. Sclerotic bone metastases cannot be excluded. IMPRESSION: Diffuse liver metastases. Ill-defined sclerotic bone lesions in the T12 vertebral body and right inferior pubic ramus. Sclerotic bone metastases cannot excluded. Consider nuclear medicine bone scan for further evaluation. 9 mm irregular pulmonary nodule in right upper lobe, with differential diagnosis including infectious or inflammatory etiology and small primary bronchogenic carcinoma. Recommend continued attention on follow-up CT. Incidentally noted calcified uterine fibroids, aortic atherosclerosis, and mild emphysema. Electronically Signed   By: Earle Gell M.D.   On: 11/02/2016 14:36   Mr Jeri Cos And Wo Contrast  Addendum Date: 11/10/2016   ADDENDUM REPORT: 11/10/2016 13:24 ADDENDUM: Sagittal T1  postcontrast was inadvertently not performed. The patient returned approximately 90 minutes later for this sequence. Sagittal T1 postcontrast imaging reveals solitary left temporal enhancing lesion, no additional metastatic deposits in the brain. Electronically Signed   By: Franchot Gallo M.D.   On: 11/10/2016 13:24   Result Date: 11/10/2016 CLINICAL DATA:  Metastatic breast cancer.  Brain mass on CT. EXAM: MRI HEAD WITHOUT AND WITH CONTRAST TECHNIQUE: Multiplanar, multiecho pulse sequences of the brain and surrounding structures were obtained without and with intravenous contrast. CONTRAST:  64m MULTIHANCE GADOBENATE DIMEGLUMINE 529 MG/ML IV SOLN COMPARISON:  CT head 11/10/2016 FINDINGS: Brain: Enhancing mass lesion in the left mid temporal lobe measures 19 x 18 mm and is consistent with metastatic disease. There is extensive vasogenic edema surrounding the mass lesion. Mild shift of the midline structures measuring 5 mm to the right. Negative for hemorrhage. No other enhancing metastatic deposits identified. Ventricles normal in size. Negative for acute infarct. Mild chronic microvascular ischemic change in the cerebral white matter and pons. Vascular: Normal arterial flow voids. Hypoplastic basilar due to fetal origin of the posterior cerebral artery bilaterally. Skull and upper cervical spine: No skeletal lesions identified. Sinuses/Orbits: Negative none Other: None IMPRESSION: Metastatic deposit left temporal lobe with extensive surrounding edema and 5 mm midline shift to the right. The mass measures 19 x 18 mm.  No other metastatic deposits identified in the brain. Electronically Signed: By: Franchot Gallo M.D. On: 11/10/2016 12:23   US Abdomen Complete  Result Date: 10/26/2016 CLINICAL DATA:  Elevated liver function tests. EXAM: ABDOMEN ULTRASOUND COMPLETE COMPARISON:  Ultrasound dated 03/24/2007 FINDINGS: Gallbladder: No gallstones or wall thickening visualized. No sonographic Murphy sign noted by  sonographer. Common bile duct: Diameter: 3.4 mm, normal. Liver: There are multiple solid slightly hypoechoic masses in the liver with the largest being 4 cm in diameter in the anterior aspect of the right lobe. No dilated intrahepatic bile ducts. IVC: No abnormality visualized. Pancreas: Visualized portion unremarkable. Spleen: Size and appearance within normal limits. Right Kidney: Length: 9.4 cm. Echogenicity within normal limits. No mass or hydronephrosis visualized. Left Kidney: Length: 10.7 cm. Echogenicity within normal limits. No mass or hydronephrosis visualized. Abdominal aorta: No aneurysm visualized.  Maximum diameter 2.4 cm. Other findings: None. IMPRESSION: Multiple hypoechoic solid masses in the liver consistent with metastatic disease of unknown primary. Electronically Signed   By: Lorriane Shire M.D.   On: 10/26/2016 09:52   Ct Abdomen Pelvis W Contrast  Result Date: 11/02/2016 CLINICAL DATA:  Left upper outer quadrant breast carcinoma. Elevated liver function tests. Liver masses seen on recent ultrasound. EXAM: CT CHEST, ABDOMEN, AND PELVIS WITH CONTRAST TECHNIQUE: Multidetector CT imaging of the chest, abdomen and pelvis was performed following the standard protocol during bolus administration of intravenous contrast. CONTRAST:  100 mL Isovue-300 COMPARISON:  Ultrasound on 10/26/2016 FINDINGS: CT CHEST FINDINGS Cardiovascular: No acute findings. Mediastinum/Lymph Nodes: No masses or pathologically enlarged lymph nodes identified. Previous left mastectomy. No evidence of axillary lymphadenopathy. Lungs/Pleura: Mild emphysema. Post radiation changes noted in left upper lobe. A 9 mm irregular nodular density is seen in the inferior aspect of the right upper lobe on image 70/6. This does not have typical appearance of pulmonary metastasis, and differential diagnosis includes inflammatory or infectious etiology and small bronchogenic carcinoma. No evidence of pleural effusion. Musculoskeletal:  No  suspicious bone lesions identified. CT ABDOMEN AND PELVIS FINDINGS Hepatobiliary: Numerous diffuse low-attenuation masses are seen throughout the right and left hepatic lobes, consistent with diffuse liver metastases. Index mass in segment 8 of the right lobe measures 6.6 x 5.2 cm on image 50/2. Gallbladder is unremarkable. Pancreas:  No mass or inflammatory changes. Spleen:  Within normal limits in size and appearance. Adrenals/Urinary tract:  No masses or hydronephrosis. Stomach/Bowel: No evidence of obstruction, inflammatory process, or abnormal fluid collections. Vascular/Lymphatic: No pathologically enlarged lymph nodes identified. No abdominal aortic aneurysm. Aortic atherosclerosis. Reproductive: Calcified uterine fibroids seen, largest measuring approximately 4 cm. No adnexal mass or free fluid identified. Other:  None. Musculoskeletal: Ill-defined sclerotic bone lesions seen in the T12 vertebral body and right inferior pubic ramus. Sclerotic bone metastases cannot be excluded. IMPRESSION: Diffuse liver metastases. Ill-defined sclerotic bone lesions in the T12 vertebral body and right inferior pubic ramus. Sclerotic bone metastases cannot excluded. Consider nuclear medicine bone scan for further evaluation. 9 mm irregular pulmonary nodule in right upper lobe, with differential diagnosis including infectious or inflammatory etiology and small primary bronchogenic carcinoma. Recommend continued attention on follow-up CT. Incidentally noted calcified uterine fibroids, aortic atherosclerosis, and mild emphysema. Electronically Signed   By: Earle Gell M.D.   On: 11/02/2016 14:36   US Biopsy  Result Date: 11/06/2016 INDICATION: 61 year old with history of left breast cancer and multiple liver lesions. Patient needs tissue sampling of the liver. EXAM: ULTRASOUND-GUIDED BIOPSY OF LIVER LESION MEDICATIONS: None. ANESTHESIA/SEDATION: Moderate (conscious) sedation was  employed during this procedure. A total of  Versed 1.5 mg and Fentanyl 50 mcg was administered intravenously. Moderate Sedation Time: 24 minutes. The patient's level of consciousness and vital signs were monitored continuously by radiology nursing throughout the procedure under my direct supervision. FLUOROSCOPY TIME:  None COMPLICATIONS: None immediate. PROCEDURE: Informed written consent was obtained from the patient after a thorough discussion of the procedural risks, benefits and alternatives. All questions were addressed. A timeout was performed prior to the initiation of the procedure. Liver was evaluated with ultrasound. The right hepatic lobe was selected for biopsy. Right side of the abdomen was prepped with chlorhexidine and a sterile field was created. The skin and soft tissues were anesthetized with 1% lidocaine. 17 gauge needle was directed into a right hepatic lesion with ultrasound guidance. A total of 5 core biopsies were performed with an 18 gauge device. Three of the biopsies yielded adequate specimens and specimens were placed in formalin. 17 gauge needle was removed without complication. Bandage placed over the puncture site. FINDINGS: Numerous subtle lesions scattered throughout the liver parenchyma. Slightly hyperechoic lesion in the inferior right hepatic lobe was targeted for biopsy. Biopsy needle was confirmed within the lesion. IMPRESSION: Ultrasound-guided core biopsies of a right hepatic lesion. Electronically Signed   By: Markus Daft M.D.   On: 11/06/2016 13:14    ASSESSMENT & PLAN:   # 61 year old female patient with a history of triple positive breast cancer with recent diagnosis of metastatic disease to the liver is currently admitted to the hospital for mental status changes  # Mental status changes/ word finding difficulty- secondary to cerebral edema with left temporal lobe metastases approximately 1.5 cm in size. Reviewed with Dr. Donella Stade recommend radiation. Given the significant progression of the disease in the  liver I would not recommend surgery at this time. Plan is to start radiation early next week; simulation this week.   # Metastatic disease in the liver biopsy proven. ER/PR/HER-2/neu status pending.  Recommend starting chemotherapy as soon as possible; plan to start the week of the 26th. Check MUGA scan. Plan docetaxel-herceptin- perjeta w 3 W.   # # Reviewed/counselled regarding the goals of care- being palliative/treatment are usually indefinite-until progression or side effects. Goal is to maintain quality of life as the disease is incurable. Discussed with Meghan from palliative care.   # # I reviewed the blood work- with the patient in detail; also reviewed the imaging independently [as summarized above]; and with the patient in detail. My contact information was given to the patient again and also to the patient's son Thurmond Butts by the bedside.  # Discussed with the patient and son that she should not drive- given the active brain metastases. Also recommend  help at home.   # The above plan of care was reviewed with Dr.Sudini. Thank you Dr. Darvin Neighbours for allowing me to participate in the care of your pleasant patient. Please do not hesitate to contact me with questions or concerns in the interim.  All questions were answered. The patient knows to call the clinic with any problems, questions or concerns.     Cammie Sickle, MD 11/11/2016 9:20 AM

## 2016-11-12 ENCOUNTER — Ambulatory Visit: Payer: BLUE CROSS/BLUE SHIELD | Admitting: Radiation Oncology

## 2016-11-12 MED ORDER — DEXAMETHASONE 4 MG PO TABS: 4.0000 mg | ORAL_TABLET | Freq: Four times a day (QID) | ORAL | 0 refills | Status: DC

## 2016-11-12 NOTE — Progress Notes (Signed)
Pt is being discharge home. Discharge papers given and explained to pt and family. Pt and fanily verbalized understanding. Meds and f/u appointment reviewed.  Work note given.

## 2016-11-12 NOTE — Care Management Note (Signed)
Case Management Note  Patient Details  Name: KEISY STRICKLER MRN: [id number] Date of Birth: [birth year]  Patient for discharge home.  No needs identified or reported to care manager.  To follow up with radiation oncology next week.   Expected Discharge Date:  11/12/16               Expected Discharge Plan:  Home/Self Care  In-House Referral:     Discharge planning Services  CM Consult  Post Acute Care Choice:    Choice offered to:     DME Arranged:    DME Agency:     HH Arranged:    HH Agency:     Status of Service:  Completed, signed off  If discussed at [employer] of Stay Meetings, dates discussed:    Additional Comments:  Katrina Stack, RN 11/12/2016, 4:07 PM

## 2016-11-12 NOTE — Discharge Instructions (Signed)
Do not drive till you see Dr. Rogue Bussing.  Low salt diet. Activity as tolerated

## 2016-11-12 NOTE — Progress Notes (Signed)
[place], [place].   11/12/2016  Patient: Sarah Horne   Date of Birth:  03/08/1956  Date of admission:  11/10/2016  Date of Discharge  11/12/2016    To Whom it May Concern:   Wilber Oliphant  may return to work on after follow up with Dr. Rogue Bussing.   If you have any questions or concerns, please don't hesitate to call.  Sincerely,   Hillary Bow R M.D Office : [phone]   .

## 2016-11-12 NOTE — Progress Notes (Signed)
Sarah Horne   DOB:April 19, 1956   RT#:[id number]    Subjective: no head aches; talking better. Word finding improved.no nausea no vomiting. No focal weakness.  ROS: a complete 10 point review of system is done which is negative for mentioned above in history of  Present illness.  Objective:  Vitals:   11/12/16 1032 11/12/16 1034  BP: (!) 103/55 (!) 112/58  Pulse: 65   Resp: 18   Temp: 98.1 F (36.7 C)      Intake/Output Summary (Last 24 hours) at 11/12/16 1354 Last data filed at 11/12/16 0852  Gross per 24 hour  Intake              480 ml  Output              800 ml  Net             -320 ml    GENERAL Alert, no distress and comfortable. Sitting in a chair.accompanied by son and family. EYES: no pallor or icterus OROPHARYNX: no thrush or ulceration. NECK: supple, no masses felt LYMPH:  no palpable lymphadenopathy in the cervical, axillary or inguinal regions LUNGS: decreased breath sounds to auscultation at bases and  No wheeze or crackles HEART/CVS: regular rate & rhythm and no murmurs; No lower extremity edema ABDOMEN: abdomen soft, tender  on deep palpation. and normal bowel sounds Musculoskeletal:no cyanosis of digits and no clubbing  PSYCH: alert & oriented x 3 with fluent speech NEURO: no focal motor/sensory deficits SKIN:  no rashes or significant lesions   Labs:  Lab Results  Component Value Date   WBC 5.1 11/10/2016   HGB 14.1 11/10/2016   HCT 40.3 11/10/2016   MCV 92.8 11/10/2016   PLT 265 11/10/2016   NEUTROABS 3.7 11/10/2016    Lab Results  Component Value Date   NA 136 11/10/2016   K 4.1 11/10/2016   CL 99 (L) 11/10/2016   CO2 27 11/10/2016    Studies:  No results found.  Assessment & Plan:   # 61 year old female patient with recurrent metastatic breast  Cancer currently with the hospital for- headaches brain metastases.  # solitary left temporal brain metastases with significant edema on steroids. Improved. Continue dexamethasone 4 mg every 6  hours.  We will taper as outpatient.  Evaluated by Dr. Donella Stade. Planned radiation next week.  # metastatic cancer to the liver- status post biopsy. Interestingly ER/PR positive HER-2/neu negative. [previously HER-2/neu positive]. Discussed at the tumor conference. We will plan chemotherapy after finishing radiation most likely the week of February 26.  # again discussed with the patient's family that this is incurable and the treatments are palliative. Patient feels stable enough to go home. Again recommended that she does not drive.  # above plan of care was discussed with Dr.Sudini. Will make arrangements for follow in 1 week.    Cammie Sickle, MD 11/12/2016  1:54 PM

## 2016-11-13 ENCOUNTER — Other Ambulatory Visit: Payer: Self-pay | Admitting: *Deleted

## 2016-11-13 NOTE — Discharge Summary (Signed)
[facility] NAME: Sarah Horne    MR#:  [id number]  DATE OF BIRTH:  10-14-1955  DATE OF ADMISSION:  11/10/2016 ADMITTING PHYSICIAN: Hillary Bow, MD  DATE OF DISCHARGE: 11/12/2016  1:00 PM  PRIMARY CARE PHYSICIAN: Keith Rake, MD   ADMISSION DIAGNOSIS:  Cerebral edema ([place]) [G93.6] Metastasis ([place]) [C79.9]  DISCHARGE DIAGNOSIS:  Active Problems:   Cerebral edema ([place])   Metastatic cancer ([place])   Palliative care by specialist   Goals of care, counseling/discussion   DNR (do not resuscitate) discussion   SECONDARY DIAGNOSIS:   Past Medical History:  Diagnosis Date  . Breast cancer ([place]) 2016   Left-radiation  . Cancer ([place]) 2016   Stage II, ER positive, PR positive, HER-2/neu overexpressing of the left breast.  . Chemotherapy-induced peripheral neuropathy ([place])   . Heart attack 1998  . Hyperlipidemia   . Hypertension   . Neuromuscular disorder ([facility])    neuropathy  . Neuropathy ([place])      ADMITTING HISTORY  HISTORY OF PRESENT ILLNESS:  Sarah Horne  is a 61 y.o. female with a known history of Hypertension, breast cancer with liver metastases presents to the hospital due to left-sided headache and expressive aphasia. No focal numbness or weakness. No trouble swallowing or change in vision. CT scan of the head shows left frontoparietal mass with surrounding edema. Patient with liver metastases and now new brain metastases is being admitted for IV Decadron. Oncology Dr. Rogue Bussing has been contacted from the emergency room.   HOSPITAL COURSE:   * Brain mets with  primary breast cancer along with Liver mets Surrounding edema with aphasia Started decadron MRI done Discussed with Dr. Rogue Bussing Radiation oncology has seen the patient. Patient discharged home on 10 days of oral Decadron. Patient will have Decadron taper by Dr. Rogue Bussing.  Patient has difficulty understanding her condition with both expressive and  possibly receptive aphasia. Case was also discussed with family at bedside on day of discharge in detail. All questions answered.  * HTN Home meds top due to recent hypotension and normal blood pressure in the hospital being off medications.  Stable for discharge home  CONSULTS OBTAINED:  Treatment Team:  Cammie Sickle, MD  DRUG ALLERGIES:   Allergies  Allergen Reactions  . No Known Allergies     DISCHARGE MEDICATIONS:   Discharge Medication List as of 11/12/2016 12:22 PM    START taking these medications   Details  dexamethasone (DECADRON) 4 MG tablet Take 1 tablet (4 mg total) by mouth 4 (four) times daily., Starting Thu 11/12/2016, Normal      CONTINUE these medications which have NOT CHANGED   Details  anastrozole (ARIMIDEX) 1 MG tablet TAKE 1 TABLET DAILY, Normal    aspirin 81 MG tablet Take 81 mg by mouth daily., Historical Med    carvedilol (COREG) 6.25 MG tablet Take 1 tablet (6.25 mg total) by mouth 2 (two) times daily., Starting Thu 05/07/2016, Normal    diphenoxylate-atropine (LOMOTIL) 2.5-0.025 MG tablet Take 1 tablet by mouth 4 (four) times daily as needed for diarrhea or loose stools. Take it along with immodium, Starting Mon 09/07/2016, Print    DULoxetine (CYMBALTA) 60 MG capsule TAKE 1 CAPSULE (60 MG TOTAL) BY MOUTH DAILY., Normal    potassium chloride SA (K-DUR,KLOR-CON) 20 MEQ tablet 1 pill twice a day, Normal      STOP taking these medications     lisinopril (PRINIVIL,ZESTRIL) 10 MG tablet  Today   VITAL SIGNS:  Blood pressure (!) 112/58, pulse 65, temperature 98.1 F (36.7 C), temperature source Oral, resp. rate 18, height 5' 6"  (1.676 m), weight 70.9 kg (156 lb 4.8 oz), SpO2 100 %.  I/O:  No intake or output data in the 24 hours ending 11/13/16 1415  PHYSICAL EXAMINATION:  Physical Exam  GENERAL:  61 y.o.-year-old patient lying in the bed with no acute distress.  LUNGS: Normal breath sounds bilaterally, no wheezing,  rales,rhonchi or crepitation. No use of accessory muscles of respiration.  CARDIOVASCULAR: S1, S2 normal. No murmurs, rubs, or gallops.  ABDOMEN: Soft, non-tender, non-distended. Bowel sounds present. No organomegaly or mass.  NEUROLOGIC: Moves all 4 extremities.expressive aphasia PSYCHIATRIC: The patient is alert and oriented x 3.  SKIN: No obvious rash, lesion, or ulcer.   DATA REVIEW:   CBC  Recent Labs Lab 11/10/16 0749  WBC 5.1  HGB 14.1  HCT 40.3  PLT 265    Chemistries   Recent Labs Lab 11/10/16 0749  NA 136  K 4.1  CL 99*  CO2 27  GLUCOSE 89  BUN 11  CREATININE 1.04*  CALCIUM 8.8*  AST 133*  ALT 96*  ALKPHOS 269*  BILITOT 1.2    Cardiac Enzymes  Recent Labs Lab 11/10/16 0749  TROPONINI <0.03    Microbiology Results  Results for orders placed or performed during the hospital encounter of 03/16/15  Urine culture     Status: None   Collection Time: 03/16/15  6:13 PM  Result Value Ref Range Status   Specimen Description URINE, CLEAN CATCH  Final   Special Requests Immunocompromised  Final   Culture 30,000 COLONIES/mL VIRIDANS STREPTOCOCCUS  Final   Report Status 03/20/2015 FINAL  Final    RADIOLOGY:  No results found.  Follow up with PCP in 1 week.  Management plans discussed with the patient, family and they are in agreement.  CODE STATUS:  Code Status History    Date Active Date Inactive Code Status Order ID Comments User Context   11/10/2016  8:59 AM 11/12/2016  4:00 PM Full Code [id number]  Hillary Bow, MD ED      TOTAL TIME TAKING CARE OF THIS PATIENT ON DAY OF DISCHARGE: more than 30 minutes.   Hillary Bow R M.D on 11/13/2016 at 2:16 PM  Between 7am to 6pm - Pager - [phone]  After 6pm go to [email or web address] - password [email or web address] [facility]  Office  [phone]  CC: Primary care physician; Keith Rake, MD  Note: This dictation was prepared with Dragon dictation along with smaller phrase technology. Any  transcriptional errors that result from this process are unintentional.

## 2016-11-16 DIAGNOSIS — Z170 Estrogen receptor positive status [ER+]: Secondary | ICD-10-CM | POA: Diagnosis not present

## 2016-11-16 DIAGNOSIS — C7931 Secondary malignant neoplasm of brain: Secondary | ICD-10-CM | POA: Diagnosis not present

## 2016-11-16 DIAGNOSIS — Z510 Encounter for antineoplastic radiation therapy: Secondary | ICD-10-CM | POA: Diagnosis not present

## 2016-11-16 DIAGNOSIS — C773 Secondary and unspecified malignant neoplasm of axilla and upper limb lymph nodes: Secondary | ICD-10-CM | POA: Diagnosis not present

## 2016-11-16 DIAGNOSIS — C50412 Malignant neoplasm of upper-outer quadrant of left female breast: Secondary | ICD-10-CM | POA: Diagnosis not present

## 2016-11-17 ENCOUNTER — Ambulatory Visit: Payer: BLUE CROSS/BLUE SHIELD | Admitting: Internal Medicine

## 2016-11-17 ENCOUNTER — Encounter: Admission: RE | Admit: 2016-11-17 | Payer: BLUE CROSS/BLUE SHIELD | Source: Ambulatory Visit

## 2016-11-17 ENCOUNTER — Ambulatory Visit: Payer: BLUE CROSS/BLUE SHIELD

## 2016-11-17 ENCOUNTER — Ambulatory Visit
Admission: RE | Admit: 2016-11-17 | Discharge: 2016-11-17 | Disposition: A | Discharge status: Home / Self Care | Payer: BLUE CROSS/BLUE SHIELD | Source: Ambulatory Visit | Attending: Radiation Oncology | Admitting: Radiation Oncology

## 2016-11-18 ENCOUNTER — Ambulatory Visit: Payer: BLUE CROSS/BLUE SHIELD

## 2016-11-18 ENCOUNTER — Ambulatory Visit
Admission: RE | Admit: 2016-11-18 | Discharge: 2016-11-18 | Disposition: A | Discharge status: Home / Self Care | Payer: BLUE CROSS/BLUE SHIELD | Source: Ambulatory Visit | Attending: Radiation Oncology | Admitting: Radiation Oncology

## 2016-11-18 DIAGNOSIS — Z510 Encounter for antineoplastic radiation therapy: Secondary | ICD-10-CM | POA: Diagnosis not present

## 2016-11-18 DIAGNOSIS — C773 Secondary and unspecified malignant neoplasm of axilla and upper limb lymph nodes: Secondary | ICD-10-CM | POA: Diagnosis not present

## 2016-11-18 DIAGNOSIS — C7931 Secondary malignant neoplasm of brain: Secondary | ICD-10-CM | POA: Diagnosis not present

## 2016-11-18 DIAGNOSIS — Z170 Estrogen receptor positive status [ER+]: Secondary | ICD-10-CM | POA: Diagnosis not present

## 2016-11-18 DIAGNOSIS — C50412 Malignant neoplasm of upper-outer quadrant of left female breast: Secondary | ICD-10-CM | POA: Diagnosis not present

## 2016-11-19 ENCOUNTER — Inpatient Hospital Stay (HOSPITAL_BASED_OUTPATIENT_CLINIC_OR_DEPARTMENT_OTHER): Payer: BLUE CROSS/BLUE SHIELD | Admitting: Internal Medicine

## 2016-11-19 ENCOUNTER — Ambulatory Visit
Admission: RE | Admit: 2016-11-19 | Discharge: 2016-11-19 | Disposition: A | Discharge status: Home / Self Care | Payer: BLUE CROSS/BLUE SHIELD | Source: Ambulatory Visit | Attending: Radiation Oncology | Admitting: Radiation Oncology

## 2016-11-19 ENCOUNTER — Ambulatory Visit: Payer: BLUE CROSS/BLUE SHIELD

## 2016-11-19 VITALS — BP 127/81 | HR 64 | Temp 97.3°F | Wt 154.1 lb

## 2016-11-19 DIAGNOSIS — M8580 Other specified disorders of bone density and structure, unspecified site: Secondary | ICD-10-CM | POA: Diagnosis not present

## 2016-11-19 DIAGNOSIS — G629 Polyneuropathy, unspecified: Secondary | ICD-10-CM

## 2016-11-19 DIAGNOSIS — Z79899 Other long term (current) drug therapy: Secondary | ICD-10-CM

## 2016-11-19 DIAGNOSIS — E785 Hyperlipidemia, unspecified: Secondary | ICD-10-CM | POA: Diagnosis not present

## 2016-11-19 DIAGNOSIS — Z9221 Personal history of antineoplastic chemotherapy: Secondary | ICD-10-CM | POA: Diagnosis not present

## 2016-11-19 DIAGNOSIS — I252 Old myocardial infarction: Secondary | ICD-10-CM

## 2016-11-19 DIAGNOSIS — Z808 Family history of malignant neoplasm of other organs or systems: Secondary | ICD-10-CM

## 2016-11-19 DIAGNOSIS — Z510 Encounter for antineoplastic radiation therapy: Secondary | ICD-10-CM | POA: Diagnosis not present

## 2016-11-19 DIAGNOSIS — F17210 Nicotine dependence, cigarettes, uncomplicated: Secondary | ICD-10-CM

## 2016-11-19 DIAGNOSIS — Z923 Personal history of irradiation: Secondary | ICD-10-CM | POA: Diagnosis not present

## 2016-11-19 DIAGNOSIS — Z803 Family history of malignant neoplasm of breast: Secondary | ICD-10-CM | POA: Diagnosis not present

## 2016-11-19 DIAGNOSIS — C787 Secondary malignant neoplasm of liver and intrahepatic bile duct: Secondary | ICD-10-CM | POA: Diagnosis not present

## 2016-11-19 DIAGNOSIS — I10 Essential (primary) hypertension: Secondary | ICD-10-CM | POA: Diagnosis not present

## 2016-11-19 DIAGNOSIS — Z170 Estrogen receptor positive status [ER+]: Secondary | ICD-10-CM | POA: Diagnosis not present

## 2016-11-19 DIAGNOSIS — Z7982 Long term (current) use of aspirin: Secondary | ICD-10-CM

## 2016-11-19 DIAGNOSIS — C7931 Secondary malignant neoplasm of brain: Secondary | ICD-10-CM

## 2016-11-19 DIAGNOSIS — R948 Abnormal results of function studies of other organs and systems: Secondary | ICD-10-CM

## 2016-11-19 DIAGNOSIS — C50412 Malignant neoplasm of upper-outer quadrant of left female breast: Secondary | ICD-10-CM | POA: Diagnosis not present

## 2016-11-19 DIAGNOSIS — C773 Secondary and unspecified malignant neoplasm of axilla and upper limb lymph nodes: Secondary | ICD-10-CM | POA: Diagnosis not present

## 2016-11-19 DIAGNOSIS — D701 Agranulocytosis secondary to cancer chemotherapy: Secondary | ICD-10-CM

## 2016-11-19 DIAGNOSIS — T451X5A Adverse effect of antineoplastic and immunosuppressive drugs, initial encounter: Secondary | ICD-10-CM

## 2016-11-19 NOTE — Assessment & Plan Note (Addendum)
#  RECURRENT Metastatic breast cancer- ER/PR positive HER-2/neu negative [status post liver biopsy February 2018; previously HER-2/neu positive].   # Recommend palliative chemotherapy after radiation to brain. Will plan starting chemotherapy next week.   # Recommend Taxotere Cytoxan every 3 weeks. Discussed the potential side effects including but not limited to-increasing fatigue, nausea vomiting, diarrhea, hair loss, sores in the mouth, increase risk of infection and also neuropathy. Not candidate for trial re: neuropathy/ brain mets.   # # Reviewed/counselled regarding the goals of care- being palliative/treatment are usually indefinite-until progression or side effects. Goal is to maintain quality of life as the disease is incurable.  # Growth factor-Neulasta/On pro would be given as prophylaxis for chemotherapy-induced neutropenia to prevent febrile neutropenias.   # Solitary Metastatic cancer to the brain- currently on radiation.  On steroids. Recommend tapering the dexamethasone to 4 mg 3 times a day.   # PN G-2. Continue neurontin/cymblata.   # Elevated LFTs- secondary to involvement of the liver.  # [id number] cell].  Follow up in 1 week/ chemo/labs.   # 40 minutes face-to-face with the patient discussing the above plan of care; more than 50% of time spent on prognosis/ natural history; counseling and coordination.

## 2016-11-19 NOTE — Progress Notes (Signed)
[facility] OFFICE PROGRESS NOTE  Patient Care Team: [name], MD as PCP - General (Family Medicine) [name], MD (General Surgery) [name], MD (Inactive) as Attending Physician (Internal Medicine)  No matching staging information was found for the patient.   Oncology History   # LEFT BREAST IDC; STAGE II [cT2N1] ER >90%; PR- 50-90%; her 2 NEU- POS; s/p Neoadj chemo; AUG 2016- TCH+P s/p Lumpec & partial ALND- path CR; s/p RT [finished Nov 2016];  adj Herceptin; HELD for Jan 19th 2017 [in DEC 2016-EF dropped from 63 to 51%; FEB 27th EF-42.9%]; JAN 2016 START Arimidex; May 2017- EF- 60%; May 24th 2017-Re-start Herceptin q 3W; July 28th STOP herceptin [finished 75m/m2; sec to Low EF; however 2017 Oct EF= 67%; improved]  # DEC 4th 2017- Start Neratinib 4 pills; DEC 11th 3 pills; STOPPED.  # FEB 2018- RECURRENCE ER/PR positive; Her 2 NEGATIVE [liver Bx]  # FEB 2018- Brain mets [SBRT; Dr.Crystal]  # PN- G-2; may 2017- Cymbalta 673md  # April 2016- Liver Bx- NEG;   # Drop in EF from Herceptin [recovered OCt 27th- EF 65%]  # BMD- jan 2017- osteopenia     Carcinoma of upper-outer quadrant of left breast in female, estrogen receptor positive ([facility]  12/27/2014 Initial Diagnosis    Breast cancer of upper-outer quadrant of left female breast ([facility]       INTERVAL HISTORY:  A very pleasant [age]ear old female patient with above history of stage II ER/PR positive HER-2/neu positive breast cancer - With recent liver metastases status post biopsy; also recent admission the hospital for brain metastases history for follow-up.  Patient is currently on dexamethasone 4 mg 4 times a day. She has noted significant improvement of her dysarthria; she is more up and about now. Appetite is good. No falls.  Patient denies any shortness of breath. Denies any chest pain. Denies any swelling in the legs.    REVIEW OF SYSTEMS:  A complete 10 point review of system  is done which is negative except mentioned above/history of present illness.   PAST MEDICAL HISTORY :  Past Medical History:  Diagnosis Date  . Breast cancer ([facility]   Left-radiation  . Cancer ([facility]   Stage II, ER positive, PR positive, HER-2/neu overexpressing of the left breast.  . Chemotherapy-induced peripheral neuropathy ([facility]  . Heart attack 1998  . Hyperlipidemia   . Hypertension   . Neuromuscular disorder ([facility])    neuropathy  . Neuropathy ([facility]    PAST SURGICAL HISTORY :   Past Surgical History:  Procedure Laterality Date  . BREAST BIOPSY Left 2016   Positive  . BREAST LUMPECTOMY WITH SENTINEL LYMPH NODE BIOPSY Left 05/23/2015   Procedure: LEFT BREAST WIDE EXCISION WITH AXILLARY DISSECTION, MASTOPLASTY ;  Surgeon: JeRobert BellowMD;  Location: [facility] ORS;  Service: General;  Laterality: Left;  . BREAST SURGERY Left 12/18/14   breast biopsy/INVASIVE DUCTAL CARCINOMA OF BREAST, NOTTINGHAM GRADE 2.  . Marland KitchenREAST SURGERY  05/23/2015.   Wide excision/mastoplasty, axillary dissection. No residual invasive cancer, positive for residual DCIS. 0/2 nodes identified on axillary dissection. (no SLN by technetium or methylene blue)  . CARDIAC CATHETERIZATION    . PORTACATH PLACEMENT Right 12-31-14   Dr ByBary Castilla  FAMILY HISTORY :   Family History  Problem Relation Age of Onset  . Breast cancer Maternal Aunt   . Breast cancer Cousin   . Brain cancer Maternal Uncle  SOCIAL HISTORY:   Social History  Substance Use Topics  . Smoking status: Current Every Day Smoker    Packs/day: 0.50    Years: 18.00    Types: Cigarettes  . Smokeless tobacco: Never Used  . Alcohol use No    ALLERGIES:  is allergic to no known allergies.  MEDICATIONS:  Current Outpatient Prescriptions  Medication Sig Dispense Refill  . anastrozole (ARIMIDEX) 1 MG tablet TAKE 1 TABLET DAILY 90 tablet 3  . aspirin 81 MG tablet Take 81 mg by mouth daily.    . carvedilol (COREG) 6.25 MG tablet Take  1 tablet (6.25 mg total) by mouth 2 (two) times daily. 180 tablet 0  . diphenoxylate-atropine (LOMOTIL) 2.5-0.025 MG tablet Take 1 tablet by mouth 4 (four) times daily as needed for diarrhea or loose stools. Take it along with immodium 60 tablet 0  . DULoxetine (CYMBALTA) 60 MG capsule TAKE 1 CAPSULE (60 MG TOTAL) BY MOUTH DAILY. 30 capsule 0  . potassium chloride SA (K-DUR,KLOR-CON) 20 MEQ tablet 1 pill twice a day 14 tablet 3  . dexamethasone (DECADRON) 4 MG tablet Take 1 tablet (4 mg total) by mouth 3 (three) times daily. 40 tablet 0   No current facility-administered medications for this visit.    Facility-Administered Medications Ordered in Other Visits  Medication Dose Route Frequency Provider Last Rate Last Dose  . 0.9 %  sodium chloride infusion   Intravenous Continuous [name], MD      . 0.9 %  sodium chloride infusion   Intravenous Continuous Lloyd Huger, MD 999 mL/hr at 04/24/15 1520    . heparin lock flush 100 unit/mL  500 Units Intravenous Once Dmitriy Berenzon, MD      . sodium chloride 0.9 % injection 10 mL  10 mL Intravenous PRN [name], MD   10 mL at 04/04/15 1440  . sodium chloride flush (NS) 0.9 % injection 10 mL  10 mL Intravenous PRN Dmitriy Berenzon, MD        PHYSICAL EXAMINATION: ECOG PERFORMANCE STATUS: 0 - Asymptomatic  BP 127/81 (BP Location: Right Arm, Patient Position: Sitting)   Pulse 64   Temp 97.3 F (36.3 C) (Tympanic)   Wt 154 lb 2 oz (69.9 kg)   BMI 24.88 kg/m   Filed Weights   11/19/16 0844  Weight: 154 lb 2 oz (69.9 kg)    GENERAL: Well-nourished well-developed; Alert, no distress and comfortable.  Alone. EYES: no pallor or icterus OROPHARYNX: no thrush or ulceration; good dentition  NECK: supple, no masses felt LYMPH:  no palpable lymphadenopathy in the cervical, axillary or inguinal regions LUNGS: clear to auscultation and  No wheeze or crackles HEART/CVS: regular rate & rhythm and no murmurs; No lower extremity  edema ABDOMEN:abdomen soft, non-tender and normal bowel sounds Musculoskeletal:no cyanosis of digits and no clubbing  PSYCH: alert & oriented x 3 with fluent speech NEURO: no focal motor/sensory deficits   LABORATORY DATA:  I have reviewed the data as listed    Component Value Date/Time   NA 136 11/10/2016 0749   NA 135 01/08/2015 0850   K 4.1 11/10/2016 0749   K 3.7 01/08/2015 0850   CL 99 (L) 11/10/2016 0749   CL 101 01/08/2015 0850   CO2 27 11/10/2016 0749   CO2 26 01/08/2015 0850   GLUCOSE 89 11/10/2016 0749   GLUCOSE 161 (H) 01/08/2015 0850   BUN 11 11/10/2016 0749   BUN 17 01/08/2015 0850   CREATININE 1.04 (H) 11/10/2016 2025  CREATININE 0.82 01/22/2015 1559   CALCIUM 8.8 (L) 11/10/2016 0749   CALCIUM 9.3 01/08/2015 0850   PROT 7.3 11/10/2016 0749   PROT 6.8 01/22/2015 1559   ALBUMIN 3.7 11/10/2016 0749   ALBUMIN 3.9 01/22/2015 1559   AST 133 (H) 11/10/2016 0749   AST 34 01/22/2015 1559   ALT 96 (H) 11/10/2016 0749   ALT 42 01/22/2015 1559   ALKPHOS 269 (H) 11/10/2016 0749   ALKPHOS 157 (H) 01/22/2015 1559   BILITOT 1.2 11/10/2016 0749   BILITOT 0.2 (L) 01/22/2015 1559   GFRNONAA 56 (L) 11/10/2016 0749   GFRNONAA >60 01/22/2015 1559   GFRAA >60 11/10/2016 0749   GFRAA >60 01/22/2015 1559    No results found for: SPEP, UPEP  Lab Results  Component Value Date   WBC 5.1 11/10/2016   NEUTROABS 3.7 11/10/2016   HGB 14.1 11/10/2016   HCT 40.3 11/10/2016   MCV 92.8 11/10/2016   PLT 265 11/10/2016      Chemistry      Component Value Date/Time   NA 136 11/10/2016 0749   NA 135 01/08/2015 0850   K 4.1 11/10/2016 0749   K 3.7 01/08/2015 0850   CL 99 (L) 11/10/2016 0749   CL 101 01/08/2015 0850   CO2 27 11/10/2016 0749   CO2 26 01/08/2015 0850   BUN 11 11/10/2016 0749   BUN 17 01/08/2015 0850   CREATININE 1.04 (H) 11/10/2016 0749   CREATININE 0.82 01/22/2015 1559      Component Value Date/Time   CALCIUM 8.8 (L) 11/10/2016 0749   CALCIUM 9.3  01/08/2015 0850   ALKPHOS 269 (H) 11/10/2016 0749   ALKPHOS 157 (H) 01/22/2015 1559   AST 133 (H) 11/10/2016 0749   AST 34 01/22/2015 1559   ALT 96 (H) 11/10/2016 0749   ALT 42 01/22/2015 1559   BILITOT 1.2 11/10/2016 0749   BILITOT 0.2 (L) 01/22/2015 1559            LABORATORY DATA:  I have reviewed the data as listed    Component Value Date/Time   NA 136 11/10/2016 0749   NA 135 01/08/2015 0850   K 4.1 11/10/2016 0749   K 3.7 01/08/2015 0850   CL 99 (L) 11/10/2016 0749   CL 101 01/08/2015 0850   CO2 27 11/10/2016 0749   CO2 26 01/08/2015 0850   GLUCOSE 89 11/10/2016 0749   GLUCOSE 161 (H) 01/08/2015 0850   BUN 11 11/10/2016 0749   BUN 17 01/08/2015 0850   CREATININE 1.04 (H) 11/10/2016 0749   CREATININE 0.82 01/22/2015 1559   CALCIUM 8.8 (L) 11/10/2016 0749   CALCIUM 9.3 01/08/2015 0850   PROT 7.3 11/10/2016 0749   PROT 6.8 01/22/2015 1559   ALBUMIN 3.7 11/10/2016 0749   ALBUMIN 3.9 01/22/2015 1559   AST 133 (H) 11/10/2016 0749   AST 34 01/22/2015 1559   ALT 96 (H) 11/10/2016 0749   ALT 42 01/22/2015 1559   ALKPHOS 269 (H) 11/10/2016 0749   ALKPHOS 157 (H) 01/22/2015 1559   BILITOT 1.2 11/10/2016 0749   BILITOT 0.2 (L) 01/22/2015 1559   GFRNONAA 56 (L) 11/10/2016 0749   GFRNONAA >60 01/22/2015 1559   GFRAA >60 11/10/2016 0749   GFRAA >60 01/22/2015 1559    No results found for: SPEP, UPEP  Lab Results  Component Value Date   WBC 5.1 11/10/2016   NEUTROABS 3.7 11/10/2016   HGB 14.1 11/10/2016   HCT 40.3 11/10/2016   MCV 92.8 11/10/2016  PLT 265 11/10/2016      Chemistry      Component Value Date/Time   NA 136 11/10/2016 0749   NA 135 01/08/2015 0850   K 4.1 11/10/2016 0749   K 3.7 01/08/2015 0850   CL 99 (L) 11/10/2016 0749   CL 101 01/08/2015 0850   CO2 27 11/10/2016 0749   CO2 26 01/08/2015 0850   BUN 11 11/10/2016 0749   BUN 17 01/08/2015 0850   CREATININE 1.04 (H) 11/10/2016 0749   CREATININE 0.82 01/22/2015 1559      Component  Value Date/Time   CALCIUM 8.8 (L) 11/10/2016 0749   CALCIUM 9.3 01/08/2015 0850   ALKPHOS 269 (H) 11/10/2016 0749   ALKPHOS 157 (H) 01/22/2015 1559   AST 133 (H) 11/10/2016 0749   AST 34 01/22/2015 1559   ALT 96 (H) 11/10/2016 0749   ALT 42 01/22/2015 1559   BILITOT 1.2 11/10/2016 0749   BILITOT 0.2 (L) 01/22/2015 1559     IMPRESSION: Normal left ventricular wall motion with estimated ejection fraction of 65%.   Electronically Signed   By: Marijo Sanes M.D.   On: 07/27/2016 09:04  RADIOGRAPHIC STUDIES: I have personally reviewed the radiological images as listed and agreed with the findings in the report. No results found.   ASSESSMENT & PLAN:  Carcinoma of upper-outer quadrant of left breast in female, estrogen receptor positive ([place]) # RECURRENT Metastatic breast cancer- ER/PR positive HER-2/neu negative [status post liver biopsy February 2018; previously HER-2/neu positive].   # Recommend palliative chemotherapy after radiation to brain. Will plan starting chemotherapy next week.   # Recommend Taxotere Cytoxan every 3 weeks. Discussed the potential side effects including but not limited to-increasing fatigue, nausea vomiting, diarrhea, hair loss, sores in the mouth, increase risk of infection and also neuropathy. Not candidate for trial re: neuropathy/ brain mets.   # # Reviewed/counselled regarding the goals of care- being palliative/treatment are usually indefinite-until progression or side effects. Goal is to maintain quality of life as the disease is incurable.  # Growth factor-Neulasta/On pro would be given as prophylaxis for chemotherapy-induced neutropenia to prevent febrile neutropenias.   # Solitary Metastatic cancer to the brain- currently on radiation.  On steroids. Recommend tapering the dexamethasone to 4 mg 3 times a day.   # PN G-2. Continue neurontin/cymblata.   # Elevated LFTs- secondary to involvement of the liver.  # [id number] cell].  Follow  up in 1 week/ chemo/labs.   # 40 minutes face-to-face with the patient discussing the above plan of care; more than 50% of time spent on prognosis/ natural history; counseling and coordination.  No orders of the defined types were placed in this encounter.  All questions were answered. The patient knows to call the clinic with any problems, questions or concerns.      Cammie Sickle, MD 11/20/2016 4:24 PM

## 2016-11-19 NOTE — Progress Notes (Signed)
Patient here today for follow up.   

## 2016-11-20 ENCOUNTER — Ambulatory Visit: Payer: BLUE CROSS/BLUE SHIELD

## 2016-11-20 ENCOUNTER — Telehealth: Payer: Self-pay | Admitting: *Deleted

## 2016-11-20 ENCOUNTER — Ambulatory Visit
Admission: RE | Admit: 2016-11-20 | Discharge: 2016-11-20 | Disposition: A | Discharge status: Home / Self Care | Payer: BLUE CROSS/BLUE SHIELD | Source: Ambulatory Visit | Attending: Radiation Oncology | Admitting: Radiation Oncology

## 2016-11-20 ENCOUNTER — Other Ambulatory Visit: Payer: Self-pay | Admitting: *Deleted

## 2016-11-20 DIAGNOSIS — G9389 Other specified disorders of brain: Secondary | ICD-10-CM

## 2016-11-20 DIAGNOSIS — C7931 Secondary malignant neoplasm of brain: Secondary | ICD-10-CM | POA: Diagnosis not present

## 2016-11-20 DIAGNOSIS — Z510 Encounter for antineoplastic radiation therapy: Secondary | ICD-10-CM | POA: Diagnosis not present

## 2016-11-20 DIAGNOSIS — C50412 Malignant neoplasm of upper-outer quadrant of left female breast: Secondary | ICD-10-CM

## 2016-11-20 DIAGNOSIS — D701 Agranulocytosis secondary to cancer chemotherapy: Secondary | ICD-10-CM | POA: Insufficient documentation

## 2016-11-20 DIAGNOSIS — Z170 Estrogen receptor positive status [ER+]: Secondary | ICD-10-CM | POA: Diagnosis not present

## 2016-11-20 DIAGNOSIS — T451X5A Adverse effect of antineoplastic and immunosuppressive drugs, initial encounter: Secondary | ICD-10-CM

## 2016-11-20 DIAGNOSIS — C773 Secondary and unspecified malignant neoplasm of axilla and upper limb lymph nodes: Secondary | ICD-10-CM | POA: Diagnosis not present

## 2016-11-20 MED ORDER — DEXAMETHASONE 4 MG PO TABS: 4.0000 mg | ORAL_TABLET | Freq: Three times a day (TID) | ORAL | 0 refills | Status: DC

## 2016-11-20 NOTE — Progress Notes (Signed)
DISCONTINUE OFF PATHWAY REGIMEN - Breast     A cycle is every 21 days:     Pertuzumab        Dose Mod: None     Pertuzumab        Dose Mod: None     Trastuzumab        Dose Mod: None     Trastuzumab        Dose Mod: None     Docetaxel        Dose Mod: None  **Always confirm dose/schedule in your pharmacy ordering system**    REASON: Other Reason PRIOR TREATMENT: [id number]: Docetaxel + Trastuzumab + Pertuzumab (THP) q21 Days Until Progression or Toxicity TREATMENT RESPONSE: Unable to Evaluate  START OFF PATHWAY REGIMEN - Breast   OFF00004:Docetaxel + Cyclophosphamide (TC):   A cycle is every 21 days:     Docetaxel        Dose Mod: None     Cyclophosphamide        Dose Mod: None  **Always confirm dose/schedule in your pharmacy ordering system**    Patient Characteristics: Metastatic Chemotherapy, HER2/neu Negative/Unknown/Equivocal, ER +, First Line AJCC Stage Grouping: IV Current Disease Status: Distant Metastases AJCC M Stage: X ER Status: Positive (+) AJCC N Stage: X AJCC T Stage: X HER2/neu: Negative (-) PR Status: Positive (+) Line of therapy: First Line Would you be surprised if this patient died  in the next year? I would be surprised if this patient died in the next year  Intent of Therapy: Non-Curative / Palliative Intent, Discussed with Patient

## 2016-11-20 NOTE — Telephone Encounter (Signed)
Pt needs refill of dexamethasone. Medication bottle is getting low and pt didn't know if needed to refill at same dose or be tapered of steroid. Her next appt is scheduled for 11/26/16. Please advise.

## 2016-11-20 NOTE — Telephone Encounter (Signed)
Per Dr. Chauncey Cruel have patient take dexamethasone three times a day.

## 2016-11-20 NOTE — Telephone Encounter (Signed)
Spoke with patient-gave patient new instructions for dexamethasone. Teach back process performed with patient.

## 2016-11-21 ENCOUNTER — Other Ambulatory Visit: Payer: Self-pay | Admitting: Internal Medicine

## 2016-11-22 ENCOUNTER — Other Ambulatory Visit: Payer: Self-pay | Admitting: Hematology and Oncology

## 2016-11-22 DIAGNOSIS — Z170 Estrogen receptor positive status [ER+]: Principal | ICD-10-CM

## 2016-11-22 DIAGNOSIS — C50412 Malignant neoplasm of upper-outer quadrant of left female breast: Secondary | ICD-10-CM

## 2016-11-22 DIAGNOSIS — G9389 Other specified disorders of brain: Secondary | ICD-10-CM

## 2016-11-22 MED ORDER — DEXAMETHASONE 4 MG PO TABS: 4.0000 mg | ORAL_TABLET | Freq: Three times a day (TID) | ORAL | 0 refills | Status: DC

## 2016-11-23 ENCOUNTER — Ambulatory Visit: Payer: BLUE CROSS/BLUE SHIELD | Admitting: Internal Medicine

## 2016-11-23 ENCOUNTER — Ambulatory Visit: Payer: BLUE CROSS/BLUE SHIELD

## 2016-11-23 ENCOUNTER — Ambulatory Visit
Admission: RE | Admit: 2016-11-23 | Discharge: 2016-11-23 | Disposition: A | Discharge status: Home / Self Care | Payer: BLUE CROSS/BLUE SHIELD | Source: Ambulatory Visit | Attending: Radiation Oncology | Admitting: Radiation Oncology

## 2016-11-23 DIAGNOSIS — Z510 Encounter for antineoplastic radiation therapy: Secondary | ICD-10-CM | POA: Diagnosis not present

## 2016-11-23 DIAGNOSIS — C50412 Malignant neoplasm of upper-outer quadrant of left female breast: Secondary | ICD-10-CM | POA: Diagnosis not present

## 2016-11-23 DIAGNOSIS — C773 Secondary and unspecified malignant neoplasm of axilla and upper limb lymph nodes: Secondary | ICD-10-CM | POA: Diagnosis not present

## 2016-11-23 DIAGNOSIS — Z170 Estrogen receptor positive status [ER+]: Secondary | ICD-10-CM | POA: Diagnosis not present

## 2016-11-23 DIAGNOSIS — C7931 Secondary malignant neoplasm of brain: Secondary | ICD-10-CM | POA: Diagnosis not present

## 2016-11-24 ENCOUNTER — Ambulatory Visit
Admission: RE | Admit: 2016-11-24 | Discharge: 2016-11-24 | Disposition: A | Discharge status: Home / Self Care | Payer: BLUE CROSS/BLUE SHIELD | Source: Ambulatory Visit | Attending: Radiation Oncology | Admitting: Radiation Oncology

## 2016-11-24 ENCOUNTER — Ambulatory Visit: Payer: BLUE CROSS/BLUE SHIELD

## 2016-11-24 DIAGNOSIS — Z170 Estrogen receptor positive status [ER+]: Secondary | ICD-10-CM | POA: Diagnosis not present

## 2016-11-24 DIAGNOSIS — C773 Secondary and unspecified malignant neoplasm of axilla and upper limb lymph nodes: Secondary | ICD-10-CM | POA: Diagnosis not present

## 2016-11-24 DIAGNOSIS — C50412 Malignant neoplasm of upper-outer quadrant of left female breast: Secondary | ICD-10-CM | POA: Diagnosis not present

## 2016-11-24 DIAGNOSIS — Z510 Encounter for antineoplastic radiation therapy: Secondary | ICD-10-CM | POA: Diagnosis not present

## 2016-11-24 DIAGNOSIS — C7931 Secondary malignant neoplasm of brain: Secondary | ICD-10-CM | POA: Diagnosis not present

## 2016-11-25 ENCOUNTER — Other Ambulatory Visit: Payer: Self-pay | Admitting: *Deleted

## 2016-11-25 DIAGNOSIS — C50412 Malignant neoplasm of upper-outer quadrant of left female breast: Secondary | ICD-10-CM

## 2016-11-25 DIAGNOSIS — Z170 Estrogen receptor positive status [ER+]: Principal | ICD-10-CM

## 2016-11-26 ENCOUNTER — Encounter: Payer: Self-pay | Admitting: *Deleted

## 2016-11-26 ENCOUNTER — Inpatient Hospital Stay: Payer: BLUE CROSS/BLUE SHIELD

## 2016-11-26 ENCOUNTER — Inpatient Hospital Stay: Payer: BLUE CROSS/BLUE SHIELD | Attending: Internal Medicine | Admitting: Internal Medicine

## 2016-11-26 VITALS — BP 117/78 | HR 73 | Temp 97.4°F | Wt 155.5 lb

## 2016-11-26 VITALS — BP 162/99 | HR 65

## 2016-11-26 DIAGNOSIS — T451X5A Adverse effect of antineoplastic and immunosuppressive drugs, initial encounter: Secondary | ICD-10-CM

## 2016-11-26 DIAGNOSIS — Z170 Estrogen receptor positive status [ER+]: Secondary | ICD-10-CM

## 2016-11-26 DIAGNOSIS — R1310 Dysphagia, unspecified: Secondary | ICD-10-CM | POA: Insufficient documentation

## 2016-11-26 DIAGNOSIS — Z7982 Long term (current) use of aspirin: Secondary | ICD-10-CM

## 2016-11-26 DIAGNOSIS — Z79899 Other long term (current) drug therapy: Secondary | ICD-10-CM

## 2016-11-26 DIAGNOSIS — E860 Dehydration: Secondary | ICD-10-CM | POA: Diagnosis not present

## 2016-11-26 DIAGNOSIS — I10 Essential (primary) hypertension: Secondary | ICD-10-CM

## 2016-11-26 DIAGNOSIS — Z5111 Encounter for antineoplastic chemotherapy: Secondary | ICD-10-CM | POA: Diagnosis not present

## 2016-11-26 DIAGNOSIS — Z803 Family history of malignant neoplasm of breast: Secondary | ICD-10-CM

## 2016-11-26 DIAGNOSIS — D709 Neutropenia, unspecified: Secondary | ICD-10-CM | POA: Diagnosis not present

## 2016-11-26 DIAGNOSIS — C50412 Malignant neoplasm of upper-outer quadrant of left female breast: Secondary | ICD-10-CM

## 2016-11-26 DIAGNOSIS — M7989 Other specified soft tissue disorders: Secondary | ICD-10-CM | POA: Insufficient documentation

## 2016-11-26 DIAGNOSIS — R7989 Other specified abnormal findings of blood chemistry: Secondary | ICD-10-CM

## 2016-11-26 DIAGNOSIS — Z923 Personal history of irradiation: Secondary | ICD-10-CM

## 2016-11-26 DIAGNOSIS — B370 Candidal stomatitis: Secondary | ICD-10-CM | POA: Insufficient documentation

## 2016-11-26 DIAGNOSIS — G629 Polyneuropathy, unspecified: Secondary | ICD-10-CM | POA: Diagnosis not present

## 2016-11-26 DIAGNOSIS — E785 Hyperlipidemia, unspecified: Secondary | ICD-10-CM

## 2016-11-26 DIAGNOSIS — F17210 Nicotine dependence, cigarettes, uncomplicated: Secondary | ICD-10-CM | POA: Diagnosis not present

## 2016-11-26 DIAGNOSIS — C7931 Secondary malignant neoplasm of brain: Secondary | ICD-10-CM

## 2016-11-26 DIAGNOSIS — R531 Weakness: Secondary | ICD-10-CM | POA: Insufficient documentation

## 2016-11-26 DIAGNOSIS — M8580 Other specified disorders of bone density and structure, unspecified site: Secondary | ICD-10-CM | POA: Diagnosis not present

## 2016-11-26 DIAGNOSIS — I252 Old myocardial infarction: Secondary | ICD-10-CM | POA: Diagnosis not present

## 2016-11-26 DIAGNOSIS — D701 Agranulocytosis secondary to cancer chemotherapy: Secondary | ICD-10-CM

## 2016-11-26 LAB — COMPREHENSIVE METABOLIC PANEL
ALBUMIN: 3.1 g/dL — AB (ref 3.5–5.0)
ALT: 583 U/L — AB (ref 14–54)
ANION GAP: 6 (ref 5–15)
AST: 262 U/L — ABNORMAL HIGH (ref 15–41)
Alkaline Phosphatase: 280 U/L — ABNORMAL HIGH (ref 38–126)
BUN: 38 mg/dL — ABNORMAL HIGH (ref 6–20)
CO2: 26 mmol/L (ref 22–32)
CREATININE: 0.93 mg/dL (ref 0.44–1.00)
Calcium: 8.2 mg/dL — ABNORMAL LOW (ref 8.9–10.3)
Chloride: 100 mmol/L — ABNORMAL LOW (ref 101–111)
GFR calc non Af Amer: >60 mL/min (ref >60)
GLUCOSE: 171 mg/dL — AB (ref 65–99)
Potassium: 4.3 mmol/L (ref 3.5–5.1)
SODIUM: 132 mmol/L — AB (ref 135–145)
Total Bilirubin: 2.3 mg/dL — ABNORMAL HIGH (ref 0.3–1.2)
Total Protein: 6.3 g/dL — ABNORMAL LOW (ref 6.5–8.1)

## 2016-11-26 LAB — CBC WITH DIFFERENTIAL/PLATELET
BASOS PCT: 0 %
Basophils Absolute: 0 10*3/uL (ref 0–0.1)
EOS ABS: 0 10*3/uL (ref 0–0.7)
EOS PCT: 0 %
HCT: 41.9 % (ref 35.0–47.0)
Hemoglobin: 14.3 g/dL (ref 12.0–16.0)
LYMPHS ABS: 1.4 10*3/uL (ref 1.0–3.6)
Lymphocytes Relative: 9 %
MCH: 31.9 pg (ref 26.0–34.0)
MCHC: 34.2 g/dL (ref 32.0–36.0)
MCV: 93.3 fL (ref 80.0–100.0)
Monocytes Absolute: 0 10*3/uL — ABNORMAL LOW (ref 0.2–0.9)
Monocytes Relative: 0 %
NEUTROS PCT: 91 %
Neutro Abs: 13.8 10*3/uL — ABNORMAL HIGH (ref 1.4–6.5)
Platelets: 199 10*3/uL (ref 150–440)
RBC: 4.49 MIL/uL (ref 3.80–5.20)
RDW: 15.3 % — ABNORMAL HIGH (ref 11.5–14.5)
WBC: 15.3 10*3/uL — AB (ref 3.6–11.0)

## 2016-11-26 MED ORDER — SODIUM CHLORIDE 0.9 % IV SOLN: 10.0000 mg | Freq: Once | INTRAVENOUS | Status: DC

## 2016-11-26 MED ORDER — DOCETAXEL CHEMO INJECTION 160 MG/16ML
60.0000 mg/m2 | Freq: Once | INTRAVENOUS | Status: AC
  Administered 2016-11-26: 110 mg via INTRAVENOUS
  Filled 2016-11-26: qty 11

## 2016-11-26 MED ORDER — ONDANSETRON HCL 8 MG PO TABS: 8.0000 mg | ORAL_TABLET | Freq: Three times a day (TID) | ORAL | 1 refills | Status: DC | PRN

## 2016-11-26 MED ORDER — PEGFILGRASTIM 6 MG/0.6ML ~~LOC~~ PSKT
6.0000 mg | PREFILLED_SYRINGE | Freq: Once | SUBCUTANEOUS | Status: AC
  Administered 2016-11-26: 6 mg via SUBCUTANEOUS
  Filled 2016-11-26: qty 0.6

## 2016-11-26 MED ORDER — PROCHLORPERAZINE MALEATE 10 MG PO TABS: 10.0000 mg | ORAL_TABLET | Freq: Four times a day (QID) | ORAL | 1 refills | Status: DC | PRN

## 2016-11-26 MED ORDER — PALONOSETRON HCL INJECTION 0.25 MG/5ML
0.2500 mg | Freq: Once | INTRAVENOUS | Status: AC
  Administered 2016-11-26: 0.25 mg via INTRAVENOUS
  Filled 2016-11-26: qty 5

## 2016-11-26 MED ORDER — SODIUM CHLORIDE 0.9 % IV SOLN
Freq: Once | INTRAVENOUS | Status: AC
  Administered 2016-11-26: 10:00:00 via INTRAVENOUS
  Filled 2016-11-26: qty 1000

## 2016-11-26 MED ORDER — HEPARIN SOD (PORK) LOCK FLUSH 100 UNIT/ML IV SOLN
500.0000 [IU] | Freq: Once | INTRAVENOUS | Status: AC | PRN
  Administered 2016-11-26: 500 [IU]
  Filled 2016-11-26: qty 5

## 2016-11-26 MED ORDER — DEXAMETHASONE SODIUM PHOSPHATE 10 MG/ML IJ SOLN
10.0000 mg | Freq: Once | INTRAMUSCULAR | Status: AC
  Administered 2016-11-26: 10 mg via INTRAVENOUS
  Filled 2016-11-26: qty 1

## 2016-11-26 MED ORDER — SODIUM CHLORIDE 0.9% FLUSH
10.0000 mL | INTRAVENOUS | Status: DC | PRN
  Administered 2016-11-26: 10 mL
  Filled 2016-11-26: qty 10

## 2016-11-26 MED ORDER — SODIUM CHLORIDE 0.9 % IV SOLN
400.0000 mg/m2 | Freq: Once | INTRAVENOUS | Status: AC
  Administered 2016-11-26: 720 mg via INTRAVENOUS
  Filled 2016-11-26: qty 36

## 2016-11-26 NOTE — Progress Notes (Unsigned)
Pt's AST 262 and ALT 583 today. Dr. Rogue Bussing aware of elevated LFTs and wants to proceed with treatment  1301-Pt finished treatment. Walking out to leave and fell on the floor. No acute injuries noted. Pt declined to go to ED. Dr Rogue Bussing and Doristine Johns RN notified. VSS. Pt ambulated out of clinic with family member present.

## 2016-11-26 NOTE — Progress Notes (Signed)
[facility] OFFICE PROGRESS NOTE  Patient Care Team: [name], MD as PCP - General (Family Medicine) [name], MD (General Surgery) [name], MD (Inactive) as Attending Physician (Internal Medicine)  No matching staging information was found for the patient.   Oncology History   # LEFT BREAST IDC; STAGE II [cT2N1] ER >90%; PR- 50-90%; her 2 NEU- POS; s/p Neoadj chemo; AUG 2016- TCH+P s/p Lumpec & partial ALND- path CR; s/p RT [finished Nov 2016];  adj Herceptin; HELD for Jan 19th 2017 [in DEC 2016-EF dropped from 63 to 51%; FEB 27th EF-42.9%]; JAN 2016 START Arimidex; May 2017- EF- 60%; May 24th 2017-Re-start Herceptin q 3W; July 28th STOP herceptin [finished 5m/m2; sec to Low EF; however 2017 Oct EF= 67%; improved]  # DEC 4th 2017- Start Neratinib 4 pills; DEC 11th 3 pills; STOPPED.  # FEB 2018- RECURRENCE ER/PR positive; Her 2 NEGATIVE [liver Bx]  # MARCH 1st 2018- Tax-Cytoxan  # FEB 2018- Brain mets [SBRT; Dr.Crystal; finished Feb 28th 2018]  # PN- G-2; may 2017- Cymbalta 665md  # April 2016- Liver Bx- NEG;   # Drop in EF from Herceptin [recovered OCt 27th- EF 65%]  # BMD- jan 2017- osteopenia     Carcinoma of upper-outer quadrant of left breast in female, estrogen receptor positive ([facility]  12/27/2014 Initial Diagnosis    Breast cancer of upper-outer quadrant of left female breast ([facility]       INTERVAL HISTORY:  A very pleasant [age]ear old female patient with above history of Recurrent stage IV ER/PR positive HER-2/neu negative [status post liver biopsy February 2018]; is currently status post radiation to the brain finished treatment yesterday. She continues to be on dexamethasone 4 mg 3 times a day.  Patient states her speech is much improved. Denies any falls. Appetite is good. No falls.  Patient denies any shortness of breath. Denies any chest pain. Denies any swelling in the legs.  Interested in driving. Recommend holding off  at this time. She is wanting E stating her prognosis/ for Social Security/disability. Denies any nausea vomiting. Appetite is fair. Denies any abdominal pain.   REVIEW OF SYSTEMS:  A complete 10 point review of system is done which is negative except mentioned above/history of present illness.   PAST MEDICAL HISTORY :  Past Medical History:  Diagnosis Date  . Breast cancer ([facility]   Left-radiation  . Cancer ([facility]   Stage II, ER positive, PR positive, HER-2/neu overexpressing of the left breast.  . Chemotherapy-induced peripheral neuropathy ([facility]  . Heart attack 1998  . Hyperlipidemia   . Hypertension   . Neuromuscular disorder (HCC)    neuropathy  . Neuropathy ([facility]    PAST SURGICAL HISTORY :   Past Surgical History:  Procedure Laterality Date  . BREAST BIOPSY Left 2016   Positive  . BREAST LUMPECTOMY WITH SENTINEL LYMPH NODE BIOPSY Left 05/23/2015   Procedure: LEFT BREAST WIDE EXCISION WITH AXILLARY DISSECTION, MASTOPLASTY ;  Surgeon: JeRobert BellowMD;  Location: [facility] ORS;  Service: General;  Laterality: Left;  . BREAST SURGERY Left 12/18/14   breast biopsy/INVASIVE DUCTAL CARCINOMA OF BREAST, NOTTINGHAM GRADE 2.  . Marland KitchenREAST SURGERY  05/23/2015.   Wide excision/mastoplasty, axillary dissection. No residual invasive cancer, positive for residual DCIS. 0/2 nodes identified on axillary dissection. (no SLN by technetium or methylene blue)  . CARDIAC CATHETERIZATION    . PORTACATH PLACEMENT Right 12-31-14   Dr ByBary Castilla  FAMILY HISTORY :   Family History  Problem Relation Age of Onset  . Breast cancer Maternal Aunt   . Breast cancer Cousin   . Brain cancer Maternal Uncle     SOCIAL HISTORY:   Social History  Substance Use Topics  . Smoking status: Current Every Day Smoker    Packs/day: 0.50    Years: 18.00    Types: Cigarettes  . Smokeless tobacco: Never Used  . Alcohol use No    ALLERGIES:  is allergic to no known allergies.  MEDICATIONS:  Current  Outpatient Prescriptions  Medication Sig Dispense Refill  . anastrozole (ARIMIDEX) 1 MG tablet TAKE 1 TABLET DAILY 90 tablet 3  . aspirin 81 MG tablet Take 81 mg by mouth daily.    . carvedilol (COREG) 6.25 MG tablet Take 1 tablet (6.25 mg total) by mouth 2 (two) times daily. 180 tablet 0  . dexamethasone (DECADRON) 4 MG tablet Take 1 tablet (4 mg total) by mouth 3 (three) times daily. 40 tablet 0  . diphenoxylate-atropine (LOMOTIL) 2.5-0.025 MG tablet Take 1 tablet by mouth 4 (four) times daily as needed for diarrhea or loose stools. Take it along with immodium 60 tablet 0  . DULoxetine (CYMBALTA) 60 MG capsule TAKE 1 CAPSULE (60 MG TOTAL) BY MOUTH DAILY. 30 capsule 0  . potassium chloride SA (K-DUR,KLOR-CON) 20 MEQ tablet 1 pill twice a day 14 tablet 3  . ondansetron (ZOFRAN) 8 MG tablet Take 1 tablet (8 mg total) by mouth every 8 (eight) hours as needed for nausea or vomiting (start 3 days; after chemo). 40 tablet 1  . prochlorperazine (COMPAZINE) 10 MG tablet Take 1 tablet (10 mg total) by mouth every 6 (six) hours as needed for nausea or vomiting. 40 tablet 1   No current facility-administered medications for this visit.    Facility-Administered Medications Ordered in Other Visits  Medication Dose Route Frequency Provider Last Rate Last Dose  . 0.9 %  sodium chloride infusion   Intravenous Continuous [name], MD      . 0.9 %  sodium chloride infusion   Intravenous Continuous Lloyd Huger, MD 999 mL/hr at 04/24/15 1520    . 0.9 %  sodium chloride infusion   Intravenous Once Cammie Sickle, MD      . cyclophosphamide (CYTOXAN) 720 mg in sodium chloride 0.9 % 250 mL chemo infusion  400 mg/m2 (Treatment Plan Recorded) Intravenous Once Cammie Sickle, MD      . dexamethasone (DECADRON) injection 10 mg  10 mg Intravenous Once Cammie Sickle, MD      . DOCEtaxel (TAXOTERE) 110 mg in dextrose 5 % 250 mL chemo infusion  60 mg/m2 (Treatment Plan Recorded) Intravenous  Once Cammie Sickle, MD      . heparin lock flush 100 unit/mL  500 Units Intravenous Once Dmitriy Berenzon, MD      . heparin lock flush 100 unit/mL  500 Units Intracatheter Once PRN Cammie Sickle, MD      . palonosetron (ALOXI) injection 0.25 mg  0.25 mg Intravenous Once Cammie Sickle, MD      . pegfilgrastim (NEULASTA ONPRO KIT) injection 6 mg  6 mg Subcutaneous Once Cammie Sickle, MD      . sodium chloride 0.9 % injection 10 mL  10 mL Intravenous PRN [name], MD   10 mL at 04/04/15 1440  . sodium chloride flush (NS) 0.9 % injection 10 mL  10 mL Intravenous PRN Roxana Hires, MD      .  sodium chloride flush (NS) 0.9 % injection 10 mL  10 mL Intracatheter PRN Cammie Sickle, MD        PHYSICAL EXAMINATION: ECOG PERFORMANCE STATUS: 0 - Asymptomatic  BP 117/78 (BP Location: Right Arm, Patient Position: Sitting)   Pulse 73   Temp 97.4 F (36.3 C) (Tympanic)   Wt 155 lb 8 oz (70.5 kg)   BMI 25.10 kg/m   Filed Weights   11/26/16 0914  Weight: 155 lb 8 oz (70.5 kg)    GENERAL: Well-nourished well-developed; Alert, no distress and comfortable.  Alone. EYES: no pallor or icterus OROPHARYNX: no thrush or ulceration; good dentition  NECK: supple, no masses felt LYMPH:  no palpable lymphadenopathy in the cervical, axillary or inguinal regions LUNGS: clear to auscultation and  No wheeze or crackles HEART/CVS: regular rate & rhythm and no murmurs; No lower extremity edema ABDOMEN:abdomen soft, non-tender and normal bowel sounds Musculoskeletal:no cyanosis of digits and no clubbing  PSYCH: alert & oriented x 3 with fluent speech NEURO: no focal motor/sensory deficits   LABORATORY DATA:  I have reviewed the data as listed    Component Value Date/Time   NA 132 (L) 11/26/2016 0840   NA 135 01/08/2015 0850   K 4.3 11/26/2016 0840   K 3.7 01/08/2015 0850   CL 100 (L) 11/26/2016 0840   CL 101 01/08/2015 0850   CO2 26 11/26/2016 0840   CO2 26  01/08/2015 0850   GLUCOSE 171 (H) 11/26/2016 0840   GLUCOSE 161 (H) 01/08/2015 0850   BUN 38 (H) 11/26/2016 0840   BUN 17 01/08/2015 0850   CREATININE 0.93 11/26/2016 0840   CREATININE 0.82 01/22/2015 1559   CALCIUM 8.2 (L) 11/26/2016 0840   CALCIUM 9.3 01/08/2015 0850   PROT 6.3 (L) 11/26/2016 0840   PROT 6.8 01/22/2015 1559   ALBUMIN 3.1 (L) 11/26/2016 0840   ALBUMIN 3.9 01/22/2015 1559   AST 262 (H) 11/26/2016 0840   AST 34 01/22/2015 1559   ALT 583 (H) 11/26/2016 0840   ALT 42 01/22/2015 1559   ALKPHOS 280 (H) 11/26/2016 0840   ALKPHOS 157 (H) 01/22/2015 1559   BILITOT 2.3 (H) 11/26/2016 0840   BILITOT 0.2 (L) 01/22/2015 1559   GFRNONAA >60 11/26/2016 0840   GFRNONAA >60 01/22/2015 1559   GFRAA >60 11/26/2016 0840   GFRAA >60 01/22/2015 1559    No results found for: SPEP, UPEP  Lab Results  Component Value Date   WBC 15.3 (H) 11/26/2016   NEUTROABS 13.8 (H) 11/26/2016   HGB 14.3 11/26/2016   HCT 41.9 11/26/2016   MCV 93.3 11/26/2016   PLT 199 11/26/2016      Chemistry      Component Value Date/Time   NA 132 (L) 11/26/2016 0840   NA 135 01/08/2015 0850   K 4.3 11/26/2016 0840   K 3.7 01/08/2015 0850   CL 100 (L) 11/26/2016 0840   CL 101 01/08/2015 0850   CO2 26 11/26/2016 0840   CO2 26 01/08/2015 0850   BUN 38 (H) 11/26/2016 0840   BUN 17 01/08/2015 0850   CREATININE 0.93 11/26/2016 0840   CREATININE 0.82 01/22/2015 1559      Component Value Date/Time   CALCIUM 8.2 (L) 11/26/2016 0840   CALCIUM 9.3 01/08/2015 0850   ALKPHOS 280 (H) 11/26/2016 0840   ALKPHOS 157 (H) 01/22/2015 1559   AST 262 (H) 11/26/2016 0840   AST 34 01/22/2015 1559   ALT 583 (H) 11/26/2016 0840   ALT 42  01/22/2015 1559   BILITOT 2.3 (H) 11/26/2016 0840   BILITOT 0.2 (L) 01/22/2015 1559            LABORATORY DATA:  I have reviewed the data as listed    Component Value Date/Time   NA 132 (L) 11/26/2016 0840   NA 135 01/08/2015 0850   K 4.3 11/26/2016 0840   K 3.7  01/08/2015 0850   CL 100 (L) 11/26/2016 0840   CL 101 01/08/2015 0850   CO2 26 11/26/2016 0840   CO2 26 01/08/2015 0850   GLUCOSE 171 (H) 11/26/2016 0840   GLUCOSE 161 (H) 01/08/2015 0850   BUN 38 (H) 11/26/2016 0840   BUN 17 01/08/2015 0850   CREATININE 0.93 11/26/2016 0840   CREATININE 0.82 01/22/2015 1559   CALCIUM 8.2 (L) 11/26/2016 0840   CALCIUM 9.3 01/08/2015 0850   PROT 6.3 (L) 11/26/2016 0840   PROT 6.8 01/22/2015 1559   ALBUMIN 3.1 (L) 11/26/2016 0840   ALBUMIN 3.9 01/22/2015 1559   AST 262 (H) 11/26/2016 0840   AST 34 01/22/2015 1559   ALT 583 (H) 11/26/2016 0840   ALT 42 01/22/2015 1559   ALKPHOS 280 (H) 11/26/2016 0840   ALKPHOS 157 (H) 01/22/2015 1559   BILITOT 2.3 (H) 11/26/2016 0840   BILITOT 0.2 (L) 01/22/2015 1559   GFRNONAA >60 11/26/2016 0840   GFRNONAA >60 01/22/2015 1559   GFRAA >60 11/26/2016 0840   GFRAA >60 01/22/2015 1559    No results found for: SPEP, UPEP  Lab Results  Component Value Date   WBC 15.3 (H) 11/26/2016   NEUTROABS 13.8 (H) 11/26/2016   HGB 14.3 11/26/2016   HCT 41.9 11/26/2016   MCV 93.3 11/26/2016   PLT 199 11/26/2016      Chemistry      Component Value Date/Time   NA 132 (L) 11/26/2016 0840   NA 135 01/08/2015 0850   K 4.3 11/26/2016 0840   K 3.7 01/08/2015 0850   CL 100 (L) 11/26/2016 0840   CL 101 01/08/2015 0850   CO2 26 11/26/2016 0840   CO2 26 01/08/2015 0850   BUN 38 (H) 11/26/2016 0840   BUN 17 01/08/2015 0850   CREATININE 0.93 11/26/2016 0840   CREATININE 0.82 01/22/2015 1559      Component Value Date/Time   CALCIUM 8.2 (L) 11/26/2016 0840   CALCIUM 9.3 01/08/2015 0850   ALKPHOS 280 (H) 11/26/2016 0840   ALKPHOS 157 (H) 01/22/2015 1559   AST 262 (H) 11/26/2016 0840   AST 34 01/22/2015 1559   ALT 583 (H) 11/26/2016 0840   ALT 42 01/22/2015 1559   BILITOT 2.3 (H) 11/26/2016 0840   BILITOT 0.2 (L) 01/22/2015 1559     IMPRESSION: Normal left ventricular wall motion with estimated ejection  fraction of 65%.   Electronically Signed   By: Marijo Sanes M.D.   On: 07/27/2016 09:04  RADIOGRAPHIC STUDIES: I have personally reviewed the radiological images as listed and agreed with the findings in the report. No results found.   ASSESSMENT & PLAN:  Carcinoma of upper-outer quadrant of left breast in female, estrogen receptor positive ([place]) # RECURRENT Metastatic breast cancer- ER/PR positive HER-2/neu negative [status post liver biopsy February 2018; previously HER-2/neu positive].   # Recommend Taxotere Cytoxan every 3 weeks; 4-6 cycles.  cycle #1 chemo today. Reduced the dose of toxotere to 52m/m2; cytoxan- 400 mg/m2.  Discussed with pharmacy.   # # Reviewed/counselled regarding the goals of care- being palliative/treatment are usually indefinite-until  progression or side effects. Goal is to maintain quality of life as the disease is incurable. Discussed specific regarding diarrhea- recommend Lomotil/Imodium. Also prescribed antiemetics.  # Growth factor-Neulasta/On pro would be given as prophylaxis for chemotherapy-induced neutropenia to prevent febrile neutropenias.   # Solitary Metastatic cancer to the brain- s/p  Radiation [11/25/2016].  On steroids dex TID; will taper at next visit.   # PN G-2. Continue neurontin/cymblata.   # Elevated LFTs- secondary to involvement of the liver. Chemo dose reduced.   # Needs letter for work disability/ social security- letter; wants to pick for tomorrow.   # follow up in 10 days/Labs/MD/ possible IVFs.   Orders Placed This Encounter  Procedures  . CBC with Differential/Platelet    Standing Status:   Future    Standing Expiration Date:   12/25/2016  . Comprehensive metabolic panel    Standing Status:   Future    Standing Expiration Date:   12/25/2016  . CBC with Differential/Platelet    Standing Status:   Future    Standing Expiration Date:   02/26/2017  . Comprehensive metabolic panel    Standing Status:   Future    Standing  Expiration Date:   02/26/2017   All questions were answered. The patient knows to call the clinic with any problems, questions or concerns.      Cammie Sickle, MD 11/26/2016 9:50 AM

## 2016-11-26 NOTE — Assessment & Plan Note (Addendum)
#  RECURRENT Metastatic breast cancer- ER/PR positive HER-2/neu negative [status post liver biopsy February 2018; previously HER-2/neu positive].   # Recommend Taxotere Cytoxan every 3 weeks; 4-6 cycles.  cycle #1 chemo today. Reduced the dose of toxotere to '60mg'$ /m2; cytoxan- 400 mg/m2.  Discussed with pharmacy.   # # Reviewed/counselled regarding the goals of care- being palliative/treatment are usually indefinite-until progression or side effects. Goal is to maintain quality of life as the disease is incurable. Discussed specific regarding diarrhea- recommend Lomotil/Imodium. Also prescribed antiemetics.  # Growth factor-Neulasta/On pro would be given as prophylaxis for chemotherapy-induced neutropenia to prevent febrile neutropenias.   # Solitary Metastatic cancer to the brain- s/p  Radiation [11/25/2016].  On steroids dex TID; will taper at next visit.   # PN G-2. Continue neurontin/cymblata.   # Elevated LFTs- secondary to involvement of the liver. Chemo dose reduced.   # Needs letter for work disability/ social security- letter; wants to pick for tomorrow.   # follow up in 10 days/Labs/MD/ possible IVFs.

## 2016-11-26 NOTE — Progress Notes (Signed)
Patient here today for follow up.   

## 2016-11-26 NOTE — Progress Notes (Signed)
Letter written and given to patient today. Patient requested a letter to be written to state her dx and that she would not be able to return to work. She would like to apply for disability.

## 2016-12-01 ENCOUNTER — Telehealth: Payer: Self-pay | Admitting: *Deleted

## 2016-12-01 NOTE — Telephone Encounter (Signed)
Asking what to take for hard stools. Advised to take Miralax daily and to use Colace or Senna daily. She repeated this back to me

## 2016-12-04 ENCOUNTER — Inpatient Hospital Stay: Payer: BLUE CROSS/BLUE SHIELD

## 2016-12-04 ENCOUNTER — Emergency Department: Payer: BLUE CROSS/BLUE SHIELD

## 2016-12-04 ENCOUNTER — Inpatient Hospital Stay
Admission: EM | Admit: 2016-12-04 | Discharge: 2016-12-11 | DRG: 299 | Disposition: A | Discharge status: Skilled Nursing Facility | Payer: BLUE CROSS/BLUE SHIELD | Attending: Internal Medicine | Admitting: Internal Medicine

## 2016-12-04 ENCOUNTER — Encounter: Payer: Self-pay | Admitting: Emergency Medicine

## 2016-12-04 ENCOUNTER — Inpatient Hospital Stay (HOSPITAL_BASED_OUTPATIENT_CLINIC_OR_DEPARTMENT_OTHER): Payer: BLUE CROSS/BLUE SHIELD | Admitting: Internal Medicine

## 2016-12-04 VITALS — BP 112/82 | HR 72 | Temp 96.2°F | Resp 18 | Wt 146.5 lb

## 2016-12-04 DIAGNOSIS — R262 Difficulty in walking, not elsewhere classified: Secondary | ICD-10-CM | POA: Diagnosis not present

## 2016-12-04 DIAGNOSIS — F17210 Nicotine dependence, cigarettes, uncomplicated: Secondary | ICD-10-CM | POA: Diagnosis present

## 2016-12-04 DIAGNOSIS — R531 Weakness: Secondary | ICD-10-CM

## 2016-12-04 DIAGNOSIS — G9389 Other specified disorders of brain: Secondary | ICD-10-CM

## 2016-12-04 DIAGNOSIS — C7931 Secondary malignant neoplasm of brain: Secondary | ICD-10-CM

## 2016-12-04 DIAGNOSIS — G620 Drug-induced polyneuropathy: Secondary | ICD-10-CM | POA: Diagnosis not present

## 2016-12-04 DIAGNOSIS — Z79899 Other long term (current) drug therapy: Secondary | ICD-10-CM

## 2016-12-04 DIAGNOSIS — I10 Essential (primary) hypertension: Secondary | ICD-10-CM | POA: Diagnosis not present

## 2016-12-04 DIAGNOSIS — Z716 Tobacco abuse counseling: Secondary | ICD-10-CM | POA: Diagnosis not present

## 2016-12-04 DIAGNOSIS — Z9010 Acquired absence of unspecified breast and nipple: Secondary | ICD-10-CM

## 2016-12-04 DIAGNOSIS — I739 Peripheral vascular disease, unspecified: Secondary | ICD-10-CM | POA: Diagnosis not present

## 2016-12-04 DIAGNOSIS — Z803 Family history of malignant neoplasm of breast: Secondary | ICD-10-CM | POA: Diagnosis not present

## 2016-12-04 DIAGNOSIS — Z743 Need for continuous supervision: Secondary | ICD-10-CM | POA: Diagnosis not present

## 2016-12-04 DIAGNOSIS — K922 Gastrointestinal hemorrhage, unspecified: Secondary | ICD-10-CM

## 2016-12-04 DIAGNOSIS — G629 Polyneuropathy, unspecified: Secondary | ICD-10-CM | POA: Diagnosis not present

## 2016-12-04 DIAGNOSIS — I252 Old myocardial infarction: Secondary | ICD-10-CM | POA: Diagnosis not present

## 2016-12-04 DIAGNOSIS — E860 Dehydration: Secondary | ICD-10-CM | POA: Diagnosis not present

## 2016-12-04 DIAGNOSIS — M7989 Other specified soft tissue disorders: Secondary | ICD-10-CM

## 2016-12-04 DIAGNOSIS — B370 Candidal stomatitis: Secondary | ICD-10-CM

## 2016-12-04 DIAGNOSIS — Z923 Personal history of irradiation: Secondary | ICD-10-CM | POA: Diagnosis not present

## 2016-12-04 DIAGNOSIS — K2980 Duodenitis without bleeding: Secondary | ICD-10-CM

## 2016-12-04 DIAGNOSIS — I998 Other disorder of circulatory system: Secondary | ICD-10-CM | POA: Diagnosis not present

## 2016-12-04 DIAGNOSIS — E785 Hyperlipidemia, unspecified: Secondary | ICD-10-CM | POA: Diagnosis not present

## 2016-12-04 DIAGNOSIS — K641 Second degree hemorrhoids: Secondary | ICD-10-CM | POA: Diagnosis not present

## 2016-12-04 DIAGNOSIS — K625 Hemorrhage of anus and rectum: Secondary | ICD-10-CM | POA: Diagnosis not present

## 2016-12-04 DIAGNOSIS — Z4659 Encounter for fitting and adjustment of other gastrointestinal appliance and device: Secondary | ICD-10-CM

## 2016-12-04 DIAGNOSIS — Z4682 Encounter for fitting and adjustment of non-vascular catheter: Secondary | ICD-10-CM | POA: Diagnosis not present

## 2016-12-04 DIAGNOSIS — R1310 Dysphagia, unspecified: Secondary | ICD-10-CM | POA: Diagnosis not present

## 2016-12-04 DIAGNOSIS — T451X5A Adverse effect of antineoplastic and immunosuppressive drugs, initial encounter: Secondary | ICD-10-CM | POA: Diagnosis present

## 2016-12-04 DIAGNOSIS — Z7952 Long term (current) use of systemic steroids: Secondary | ICD-10-CM

## 2016-12-04 DIAGNOSIS — R578 Other shock: Secondary | ICD-10-CM | POA: Diagnosis not present

## 2016-12-04 DIAGNOSIS — I80209 Phlebitis and thrombophlebitis of unspecified deep vessels of unspecified lower extremity: Secondary | ICD-10-CM | POA: Diagnosis not present

## 2016-12-04 DIAGNOSIS — Z170 Estrogen receptor positive status [ER+]: Secondary | ICD-10-CM | POA: Diagnosis not present

## 2016-12-04 DIAGNOSIS — K269 Duodenal ulcer, unspecified as acute or chronic, without hemorrhage or perforation: Secondary | ICD-10-CM | POA: Diagnosis not present

## 2016-12-04 DIAGNOSIS — Z7902 Long term (current) use of antithrombotics/antiplatelets: Secondary | ICD-10-CM | POA: Diagnosis not present

## 2016-12-04 DIAGNOSIS — K279 Peptic ulcer, site unspecified, unspecified as acute or chronic, without hemorrhage or perforation: Secondary | ICD-10-CM | POA: Diagnosis not present

## 2016-12-04 DIAGNOSIS — C50412 Malignant neoplasm of upper-outer quadrant of left female breast: Secondary | ICD-10-CM

## 2016-12-04 DIAGNOSIS — K5900 Constipation, unspecified: Secondary | ICD-10-CM | POA: Diagnosis not present

## 2016-12-04 DIAGNOSIS — G9340 Encephalopathy, unspecified: Secondary | ICD-10-CM | POA: Diagnosis not present

## 2016-12-04 DIAGNOSIS — Z5111 Encounter for antineoplastic chemotherapy: Secondary | ICD-10-CM | POA: Diagnosis not present

## 2016-12-04 DIAGNOSIS — K209 Esophagitis, unspecified without bleeding: Secondary | ICD-10-CM

## 2016-12-04 DIAGNOSIS — Z79811 Long term (current) use of aromatase inhibitors: Secondary | ICD-10-CM | POA: Diagnosis not present

## 2016-12-04 DIAGNOSIS — K633 Ulcer of intestine: Secondary | ICD-10-CM | POA: Diagnosis not present

## 2016-12-04 DIAGNOSIS — R230 Cyanosis: Secondary | ICD-10-CM | POA: Diagnosis not present

## 2016-12-04 DIAGNOSIS — K921 Melena: Secondary | ICD-10-CM | POA: Diagnosis not present

## 2016-12-04 DIAGNOSIS — K257 Chronic gastric ulcer without hemorrhage or perforation: Secondary | ICD-10-CM | POA: Diagnosis not present

## 2016-12-04 DIAGNOSIS — C787 Secondary malignant neoplasm of liver and intrahepatic bile duct: Secondary | ICD-10-CM | POA: Diagnosis not present

## 2016-12-04 DIAGNOSIS — C50919 Malignant neoplasm of unspecified site of unspecified female breast: Secondary | ICD-10-CM | POA: Diagnosis not present

## 2016-12-04 DIAGNOSIS — K2970 Gastritis, unspecified, without bleeding: Secondary | ICD-10-CM | POA: Diagnosis not present

## 2016-12-04 DIAGNOSIS — K259 Gastric ulcer, unspecified as acute or chronic, without hemorrhage or perforation: Secondary | ICD-10-CM | POA: Diagnosis not present

## 2016-12-04 DIAGNOSIS — R12 Heartburn: Secondary | ICD-10-CM

## 2016-12-04 DIAGNOSIS — C7981 Secondary malignant neoplasm of breast: Secondary | ICD-10-CM | POA: Diagnosis not present

## 2016-12-04 DIAGNOSIS — D649 Anemia, unspecified: Secondary | ICD-10-CM | POA: Diagnosis not present

## 2016-12-04 DIAGNOSIS — M8580 Other specified disorders of bone density and structure, unspecified site: Secondary | ICD-10-CM

## 2016-12-04 DIAGNOSIS — I959 Hypotension, unspecified: Secondary | ICD-10-CM | POA: Diagnosis not present

## 2016-12-04 DIAGNOSIS — K626 Ulcer of anus and rectum: Secondary | ICD-10-CM | POA: Diagnosis not present

## 2016-12-04 DIAGNOSIS — K253 Acute gastric ulcer without hemorrhage or perforation: Secondary | ICD-10-CM

## 2016-12-04 DIAGNOSIS — Z7982 Long term (current) use of aspirin: Secondary | ICD-10-CM

## 2016-12-04 DIAGNOSIS — M6281 Muscle weakness (generalized): Secondary | ICD-10-CM | POA: Diagnosis not present

## 2016-12-04 DIAGNOSIS — R571 Hypovolemic shock: Secondary | ICD-10-CM | POA: Diagnosis not present

## 2016-12-04 DIAGNOSIS — R7989 Other specified abnormal findings of blood chemistry: Secondary | ICD-10-CM

## 2016-12-04 DIAGNOSIS — Z7901 Long term (current) use of anticoagulants: Secondary | ICD-10-CM | POA: Diagnosis not present

## 2016-12-04 DIAGNOSIS — R200 Anesthesia of skin: Secondary | ICD-10-CM | POA: Diagnosis not present

## 2016-12-04 DIAGNOSIS — D709 Neutropenia, unspecified: Secondary | ICD-10-CM | POA: Diagnosis not present

## 2016-12-04 DIAGNOSIS — F329 Major depressive disorder, single episode, unspecified: Secondary | ICD-10-CM | POA: Diagnosis not present

## 2016-12-04 DIAGNOSIS — D72829 Elevated white blood cell count, unspecified: Secondary | ICD-10-CM | POA: Diagnosis present

## 2016-12-04 LAB — CBC WITH DIFFERENTIAL/PLATELET
BAND NEUTROPHILS: 5 %
BASOS ABS: 0 10*3/uL (ref 0–0.1)
BASOS PCT: 0 %
BLASTS: 0 %
Basophils Absolute: 0 10*3/uL (ref 0–0.1)
Basophils Relative: 0 %
EOS ABS: 0 10*3/uL (ref 0–0.7)
EOS ABS: 0 10*3/uL (ref 0–0.7)
Eosinophils Relative: 0 %
Eosinophils Relative: 0 %
HCT: 42 % (ref 35.0–47.0)
HEMATOCRIT: 42.9 % (ref 35.0–47.0)
HEMOGLOBIN: 14.3 g/dL (ref 12.0–16.0)
Hemoglobin: 14.4 g/dL (ref 12.0–16.0)
LYMPHS PCT: 4 %
Lymphocytes Relative: 2 %
Lymphs Abs: 0.8 10*3/uL — ABNORMAL LOW (ref 1.0–3.6)
Lymphs Abs: 1.8 10*3/uL (ref 1.0–3.6)
MCH: 31.7 pg (ref 26.0–34.0)
MCH: 30.7 pg (ref 26.0–34.0)
MCHC: 34.4 g/dL (ref 32.0–36.0)
MCHC: 33.3 g/dL (ref 32.0–36.0)
MCV: 92.2 fL (ref 80.0–100.0)
MCV: 92 fL (ref 80.0–100.0)
METAMYELOCYTES PCT: 1 %
MONO ABS: 0 10*3/uL — AB (ref 0.2–0.9)
MONO ABS: 1.4 10*3/uL — AB (ref 0.2–0.9)
Monocytes Relative: 0 %
Monocytes Relative: 3 %
Myelocytes: 0 %
NEUTROS PCT: 98 %
Neutro Abs: 37.9 10*3/uL — ABNORMAL HIGH (ref 1.4–6.5)
Neutro Abs: 42.8 10*3/uL — ABNORMAL HIGH (ref 1.4–6.5)
Neutrophils Relative %: 87 %
OTHER: 0 %
Platelets: 66 10*3/uL — ABNORMAL LOW (ref 150–440)
Platelets: 68 10*3/uL — ABNORMAL LOW (ref 150–440)
Promyelocytes Absolute: 0 %
RBC: 4.55 MIL/uL (ref 3.80–5.20)
RBC: 4.66 MIL/uL (ref 3.80–5.20)
RDW: 15.7 % — ABNORMAL HIGH (ref 11.5–14.5)
RDW: 16.1 % — AB (ref 11.5–14.5)
WBC: 38.7 10*3/uL — AB (ref 3.6–11.0)
WBC: 46 10*3/uL — ABNORMAL HIGH (ref 3.6–11.0)
nRBC: 0 /100 WBC

## 2016-12-04 LAB — COMPREHENSIVE METABOLIC PANEL
ALBUMIN: 2.9 g/dL — AB (ref 3.5–5.0)
ALK PHOS: 373 U/L — AB (ref 38–126)
ALK PHOS: 352 U/L — AB (ref 38–126)
ALT: 463 U/L — AB (ref 14–54)
ALT: 474 U/L — ABNORMAL HIGH (ref 14–54)
ANION GAP: 6 (ref 5–15)
AST: 218 U/L — ABNORMAL HIGH (ref 15–41)
AST: 219 U/L — AB (ref 15–41)
Albumin: 2.9 g/dL — ABNORMAL LOW (ref 3.5–5.0)
Anion gap: 6 (ref 5–15)
BILIRUBIN TOTAL: 2.2 mg/dL — AB (ref 0.3–1.2)
BUN: 43 mg/dL — AB (ref 6–20)
BUN: 41 mg/dL — AB (ref 6–20)
CO2: 27 mmol/L (ref 22–32)
CO2: 28 mmol/L (ref 22–32)
Calcium: 8.1 mg/dL — ABNORMAL LOW (ref 8.9–10.3)
Calcium: 8 mg/dL — ABNORMAL LOW (ref 8.9–10.3)
Chloride: 101 mmol/L (ref 101–111)
Chloride: 105 mmol/L (ref 101–111)
Creatinine, Ser: 0.61 mg/dL (ref 0.44–1.00)
Creatinine, Ser: 0.62 mg/dL (ref 0.44–1.00)
GFR calc Af Amer: >60 mL/min (ref >60)
GFR calc Af Amer: >60 mL/min (ref >60)
GFR calc non Af Amer: >60 mL/min (ref >60)
GFR calc non Af Amer: >60 mL/min (ref >60)
GLUCOSE: 149 mg/dL — AB (ref 65–99)
GLUCOSE: 140 mg/dL — AB (ref 65–99)
Potassium: 5.1 mmol/L (ref 3.5–5.1)
Potassium: 5.1 mmol/L (ref 3.5–5.1)
SODIUM: 134 mmol/L — AB (ref 135–145)
SODIUM: 139 mmol/L (ref 135–145)
Total Bilirubin: 2.1 mg/dL — ABNORMAL HIGH (ref 0.3–1.2)
Total Protein: 6 g/dL — ABNORMAL LOW (ref 6.5–8.1)
Total Protein: 6.1 g/dL — ABNORMAL LOW (ref 6.5–8.1)

## 2016-12-04 LAB — PROTIME-INR
INR: 1.03
Prothrombin Time: 13.5 seconds (ref 11.4–15.2)

## 2016-12-04 LAB — TYPE AND SCREEN
ABO/RH(D): A POS
Antibody Screen: NEGATIVE

## 2016-12-04 LAB — PLATELET COUNT: Platelets: 65 10*3/uL — ABNORMAL LOW (ref 150–440)

## 2016-12-04 LAB — APTT: aPTT: 24 seconds (ref 24–36)

## 2016-12-04 MED ORDER — PROCHLORPERAZINE MALEATE 10 MG PO TABS
10.0000 mg | ORAL_TABLET | Freq: Four times a day (QID) | ORAL | Status: DC | PRN
  Filled 2016-12-04: qty 1

## 2016-12-04 MED ORDER — SODIUM CHLORIDE 0.9 % IV BOLUS (SEPSIS)
1000.0000 mL | Freq: Once | INTRAVENOUS | Status: AC
Start: 2016-12-04 — End: 2016-12-04
  Administered 2016-12-04: 1000 mL via INTRAVENOUS

## 2016-12-04 MED ORDER — CARVEDILOL 6.25 MG PO TABS
6.2500 mg | ORAL_TABLET | Freq: Two times a day (BID) | ORAL | Status: DC
  Administered 2016-12-04 – 2016-12-06 (×5): 6.25 mg via ORAL
  Filled 2016-12-04 (×5): qty 1

## 2016-12-04 MED ORDER — SENNOSIDES-DOCUSATE SODIUM 8.6-50 MG PO TABS
1.0000 | ORAL_TABLET | Freq: Every evening | ORAL | Status: DC | PRN
  Administered 2016-12-06: 1 via ORAL
  Filled 2016-12-04: qty 1

## 2016-12-04 MED ORDER — ACETAMINOPHEN 325 MG PO TABS: 650.0000 mg | ORAL_TABLET | Freq: Four times a day (QID) | ORAL | Status: DC | PRN

## 2016-12-04 MED ORDER — IOPAMIDOL (ISOVUE-370) INJECTION 76%
125.0000 mL | Freq: Once | INTRAVENOUS | Status: AC | PRN
  Administered 2016-12-04: 125 mL via INTRAVENOUS

## 2016-12-04 MED ORDER — HEPARIN (PORCINE) IN NACL 100-0.45 UNIT/ML-% IJ SOLN
1100.0000 [IU]/h | INTRAMUSCULAR | Status: DC
  Administered 2016-12-04: 1100 [IU]/h via INTRAVENOUS
  Filled 2016-12-04 (×2): qty 250

## 2016-12-04 MED ORDER — DIPHENOXYLATE-ATROPINE 2.5-0.025 MG PO TABS: 1.0000 | ORAL_TABLET | Freq: Four times a day (QID) | ORAL | Status: DC | PRN

## 2016-12-04 MED ORDER — ASPIRIN EC 81 MG PO TBEC
81.0000 mg | DELAYED_RELEASE_TABLET | Freq: Every day | ORAL | Status: DC
  Administered 2016-12-05 – 2016-12-06 (×2): 81 mg via ORAL
  Filled 2016-12-04 (×2): qty 1

## 2016-12-04 MED ORDER — HEPARIN BOLUS VIA INFUSION
3500.0000 [IU] | Freq: Once | INTRAVENOUS | Status: AC
  Administered 2016-12-04: 3500 [IU] via INTRAVENOUS
  Filled 2016-12-04: qty 3500

## 2016-12-04 MED ORDER — ANASTROZOLE 1 MG PO TABS
1.0000 mg | ORAL_TABLET | Freq: Every day | ORAL | Status: DC
  Administered 2016-12-05 – 2016-12-11 (×5): 1 mg via ORAL
  Filled 2016-12-04 (×5): qty 1

## 2016-12-04 MED ORDER — ONDANSETRON HCL 4 MG PO TABS: 8.0000 mg | ORAL_TABLET | Freq: Three times a day (TID) | ORAL | Status: DC | PRN

## 2016-12-04 MED ORDER — ACETAMINOPHEN 650 MG RE SUPP: 650.0000 mg | Freq: Four times a day (QID) | RECTAL | Status: DC | PRN

## 2016-12-04 MED ORDER — TRAMADOL HCL 50 MG PO TABS: 50.0000 mg | ORAL_TABLET | Freq: Four times a day (QID) | ORAL | Status: DC | PRN

## 2016-12-04 MED ORDER — POTASSIUM CHLORIDE CRYS ER 20 MEQ PO TBCR
20.0000 meq | EXTENDED_RELEASE_TABLET | Freq: Two times a day (BID) | ORAL | Status: DC
  Administered 2016-12-04 – 2016-12-10 (×10): 20 meq via ORAL
  Filled 2016-12-04 (×10): qty 1

## 2016-12-04 MED ORDER — DULOXETINE HCL 60 MG PO CPEP
60.0000 mg | ORAL_CAPSULE | Freq: Every day | ORAL | Status: DC
  Administered 2016-12-05 – 2016-12-11 (×5): 60 mg via ORAL
  Filled 2016-12-04: qty 2
  Filled 2016-12-04 (×2): qty 1
  Filled 2016-12-04: qty 2
  Filled 2016-12-04: qty 1

## 2016-12-04 NOTE — ED Triage Notes (Signed)
Pt arrived from the [facility]. Pt sent for evaluation of possible arterial clot. Pt lower legs cold to touch bilaterally.

## 2016-12-04 NOTE — Assessment & Plan Note (Addendum)
#  RECURRENT Metastatic breast cancer- ER/PR positive HER-2/neu negative [status post liver biopsy February 2018; previously HER-2/neu positive]. Currently s/p Taxotere Cytoxan   cycle #1 chemo appx 10 days ago.  # Bilateral Lower extremity - coldness- feeble pulses. recommend urgent ER evaluation- with arterial dopplers. Spoke to charge [employer] in ER.   #  Solitary Metastatic cancer to the brain- s/p  Radiation [11/25/2016].  Taper steroids dex '4mg'$  day.  # oral thrush- recommend continued nystatin   # PN G-2. Continue neurontin/cymblata.   # Elevated LFTs- secondary to involvement of the liver. Chemo dose reduced; stable.   # follow up as planned for the next round of chemotherapy; or based on ER visit/evaluation.

## 2016-12-04 NOTE — Progress Notes (Signed)
[facility] OFFICE PROGRESS NOTE  Patient Care Team: Sarah Nova, MD as PCP - General (Family Medicine) [name], MD (General Surgery) [name], MD (Inactive) as Attending Physician (Internal Medicine)  No matching staging information was found for the patient.   Oncology History   # LEFT BREAST IDC; STAGE II [cT2N1] ER >90%; PR- 50-90%; her 2 NEU- POS; s/p Neoadj chemo; AUG 2016- TCH+P s/p Lumpec & partial ALND- path CR; s/p RT [finished Nov 2016];  adj Herceptin; HELD for Jan 19th 2017 [in DEC 2016-EF dropped from 63 to 51%; FEB 27th EF-42.9%]; JAN 2016 START Arimidex; May 2017- EF- 60%; May 24th 2017-Re-start Herceptin q 3W; July 28th STOP herceptin [finished 4m/m2; sec to Low EF; however 2017 Oct EF= 67%; improved]  # DEC 4th 2017- Start Neratinib 4 pills; DEC 11th 3 pills; STOPPED.  # FEB 2018- RECURRENCE ER/PR positive; Her 2 NEGATIVE [liver Bx]  # MARCH 1st 2018- Tax-Cytoxan  # FEB 2018- Brain mets [SBRT; Dr.Crystal; finished Feb 28th 2018]  # PN- G-2; may 2017- Cymbalta 649md  # April 2016- Liver Bx- NEG;   # Drop in EF from Herceptin [recovered OCt 27th- EF 65%]  # BMD- jan 2017- osteopenia     Carcinoma of upper-outer quadrant of left breast in female, estrogen receptor positive ([facility] [place]  12/27/2014 Initial Diagnosis    Breast cancer of upper-outer quadrant of left female breast ([facility]       INTERVAL HISTORY:  A very pleasant [age]ear old female patient with above history of Recurrent stage IV ER/PR positive HER-2/neu negative [status post liver biopsy February 2018] Status post cycle #1 of Cytoxan and Taxotere [reduced is because of elevated LFTs] Approximately 10 days is here for follow-up.  Patient was recently started on nystatin swish and swallow for oral thrush. Soreness in the mouth improved. However have not completely resolved. She is currently on steroids 4 mg 3 times a day.  She complains of generalized weakness.; She  is in a wheelchair. Complains of swelling in the legs. However she is more concerned about extreme tenderness to the legs bilaterally. She has chronic pain from her neuropathy in his feet. Not any worse.  Denies any nausea vomiting. Appetite is fair. Denies any abdominal pain. Otherwise denies any significant diarrhea. No falls.    REVIEW OF SYSTEMS:  A complete 10 point review of system is done which is negative except mentioned above/history of present illness.   PAST MEDICAL HISTORY :  Past Medical History:  Diagnosis Date  . Breast cancer ([facility]2016   Left-radiation  . Cancer ([facility]2016   Stage II, ER positive, PR positive, HER-2/neu overexpressing of the left breast.  . Chemotherapy-induced peripheral neuropathy ([facility]  . Heart attack 1998  . Hyperlipidemia   . Hypertension   . Neuromuscular disorder (HCC)    neuropathy  . Neuropathy ([facility]    PAST SURGICAL HISTORY :   Past Surgical History:  Procedure Laterality Date  . BREAST BIOPSY Left 2016   Positive  . BREAST LUMPECTOMY WITH SENTINEL LYMPH NODE BIOPSY Left 05/23/2015   Procedure: LEFT BREAST WIDE EXCISION WITH AXILLARY DISSECTION, MASTOPLASTY ;  Surgeon: JeRobert BellowMD;  Location: [facility] ORS;  Service: General;  Laterality: Left;  . BREAST SURGERY Left 12/18/14   breast biopsy/INVASIVE DUCTAL CARCINOMA OF BREAST, NOTTINGHAM GRADE 2.  . Marland KitchenREAST SURGERY  05/23/2015.   Wide excision/mastoplasty, axillary dissection. No residual invasive cancer, positive for residual DCIS. 0/2  nodes identified on axillary dissection. (no SLN by technetium or methylene blue)  . CARDIAC CATHETERIZATION    . PORTACATH PLACEMENT Right 12-31-14   Dr Bary Castilla    FAMILY HISTORY :   Family History  Problem Relation Age of Onset  . Breast cancer Maternal Aunt   . Breast cancer Cousin   . Brain cancer Maternal Uncle     SOCIAL HISTORY:   Social History  Substance Use Topics  . Smoking status: Current Every Day Smoker    Packs/day: 0.50     Years: 18.00    Types: Cigarettes  . Smokeless tobacco: Never Used  . Alcohol use No    ALLERGIES:  is allergic to no known allergies.  MEDICATIONS:  No current facility-administered medications for this visit.    Current Outpatient Prescriptions  Medication Sig Dispense Refill  . anastrozole (ARIMIDEX) 1 MG tablet TAKE 1 TABLET DAILY 90 tablet 3  . aspirin 81 MG tablet Take 81 mg by mouth daily.    . carvedilol (COREG) 6.25 MG tablet Take 1 tablet (6.25 mg total) by mouth 2 (two) times daily. 180 tablet 0  . dexamethasone (DECADRON) 4 MG tablet Take 1 tablet (4 mg total) by mouth 3 (three) times daily. 40 tablet 0  . diphenoxylate-atropine (LOMOTIL) 2.5-0.025 MG tablet Take 1 tablet by mouth 4 (four) times daily as needed for diarrhea or loose stools. Take it along with immodium 60 tablet 0  . DULoxetine (CYMBALTA) 60 MG capsule TAKE 1 CAPSULE (60 MG TOTAL) BY MOUTH DAILY. 30 capsule 0  . nystatin (MYCOSTATIN) 100000 UNIT/ML suspension     . ondansetron (ZOFRAN) 8 MG tablet Take 1 tablet (8 mg total) by mouth every 8 (eight) hours as needed for nausea or vomiting (start 3 days; after chemo). 40 tablet 1  . potassium chloride SA (K-DUR,KLOR-CON) 20 MEQ tablet 1 pill twice a day 14 tablet 3  . prochlorperazine (COMPAZINE) 10 MG tablet Take 1 tablet (10 mg total) by mouth every 6 (six) hours as needed for nausea or vomiting. 40 tablet 1   Facility-Administered Medications Ordered in Other Visits  Medication Dose Route Frequency Provider Last Rate Last Dose  . 0.9 %  sodium chloride infusion   Intravenous Continuous [name], MD      . 0.9 %  sodium chloride infusion   Intravenous Continuous Lloyd Huger, MD 999 mL/hr at 04/24/15 1520    . heparin bolus via infusion 3,500 Units  3,500 Units Intravenous Once Merilyn Baba, RPH       Followed by  . heparin ADULT infusion 100 units/mL (25000 units/222m sodium chloride 0.45%)  1,100 Units/hr Intravenous Continuous HMerilyn Baba RPH      . heparin lock flush 100 unit/mL  500 Units Intravenous Once Dmitriy Berenzon, MD      . sodium chloride 0.9 % injection 10 mL  10 mL Intravenous PRN SLeia Alf MD   10 mL at 04/04/15 1440  . sodium chloride flush (NS) 0.9 % injection 10 mL  10 mL Intravenous PRN Dmitriy Berenzon, MD      . sodium chloride flush (NS) 0.9 % injection 10 mL  10 mL Intracatheter PRN GCammie Sickle MD   10 mL at 11/26/16 0852    PHYSICAL EXAMINATION: ECOG PERFORMANCE STATUS: 0 - Asymptomatic  BP 112/82 (BP Location: Right Arm, Patient Position: Sitting)   Pulse 72   Temp (!) 96.2 F (35.7 C) (Tympanic)   Resp 18   Wt 146  lb 8 oz (66.5 kg)   BMI 23.65 kg/m   Filed Weights   12/04/16 1402  Weight: 146 lb 8 oz (66.5 kg)    GENERAL: Well-nourished well-developed; Alert, no distress and comfortable.  EYES: no pallor or icterus. She is in a wheelchair. Accompanied by her son. OROPHARYNX: no thrush or ulceration; good dentition  NECK: supple, no masses felt LYMPH:  no palpable lymphadenopathy in the cervical, axillary or inguinal regions LUNGS: clear to auscultation and  No wheeze or crackles HEART/CVS: regular rate & rhythm and no murmurs; 1+ bilateral lower extremity edema. Bilateral feet/leg extremely cold to touch. Pulse is stable. Discoloration of the big toes bilaterally/chronic. No obvious cyanosis. ABDOMEN:abdomen soft, non-tender and normal bowel sounds Musculoskeletal:no cyanosis of digits and no clubbing  PSYCH: alert & oriented x 3 with fluent speech NEURO: no focal motor/sensory deficits   LABORATORY DATA:  I have reviewed the data as listed    Component Value Date/Time   NA 134 (L) 12/04/2016 1325   NA 135 01/08/2015 0850   K 5.1 12/04/2016 1325   K 3.7 01/08/2015 0850   CL 101 12/04/2016 1325   CL 101 01/08/2015 0850   CO2 27 12/04/2016 1325   CO2 26 01/08/2015 0850   GLUCOSE 149 (H) 12/04/2016 1325   GLUCOSE 161 (H) 01/08/2015 0850   BUN 43 (H)  12/04/2016 1325   BUN 17 01/08/2015 0850   CREATININE 0.61 12/04/2016 1325   CREATININE 0.82 01/22/2015 1559   CALCIUM 8.1 (L) 12/04/2016 1325   CALCIUM 9.3 01/08/2015 0850   PROT 6.0 (L) 12/04/2016 1325   PROT 6.8 01/22/2015 1559   ALBUMIN 2.9 (L) 12/04/2016 1325   ALBUMIN 3.9 01/22/2015 1559   AST 218 (H) 12/04/2016 1325   AST 34 01/22/2015 1559   ALT 463 (H) 12/04/2016 1325   ALT 42 01/22/2015 1559   ALKPHOS 373 (H) 12/04/2016 1325   ALKPHOS 157 (H) 01/22/2015 1559   BILITOT 2.2 (H) 12/04/2016 1325   BILITOT 0.2 (L) 01/22/2015 1559   GFRNONAA >60 12/04/2016 1325   GFRNONAA >60 01/22/2015 1559   GFRAA >60 12/04/2016 1325   GFRAA >60 01/22/2015 1559    No results found for: SPEP, UPEP  Lab Results  Component Value Date   WBC 38.7 (H) 12/04/2016   NEUTROABS 37.9 (H) 12/04/2016   HGB 14.4 12/04/2016   HCT 42.0 12/04/2016   MCV 92.2 12/04/2016   PLT 66 (L) 12/04/2016      Chemistry      Component Value Date/Time   NA 134 (L) 12/04/2016 1325   NA 135 01/08/2015 0850   K 5.1 12/04/2016 1325   K 3.7 01/08/2015 0850   CL 101 12/04/2016 1325   CL 101 01/08/2015 0850   CO2 27 12/04/2016 1325   CO2 26 01/08/2015 0850   BUN 43 (H) 12/04/2016 1325   BUN 17 01/08/2015 0850   CREATININE 0.61 12/04/2016 1325   CREATININE 0.82 01/22/2015 1559      Component Value Date/Time   CALCIUM 8.1 (L) 12/04/2016 1325   CALCIUM 9.3 01/08/2015 0850   ALKPHOS 373 (H) 12/04/2016 1325   ALKPHOS 157 (H) 01/22/2015 1559   AST 218 (H) 12/04/2016 1325   AST 34 01/22/2015 1559   ALT 463 (H) 12/04/2016 1325   ALT 42 01/22/2015 1559   BILITOT 2.2 (H) 12/04/2016 1325   BILITOT 0.2 (L) 01/22/2015 1559            LABORATORY DATA:  I have reviewed  the data as listed    Component Value Date/Time   NA 134 (L) 12/04/2016 1325   NA 135 01/08/2015 0850   K 5.1 12/04/2016 1325   K 3.7 01/08/2015 0850   CL 101 12/04/2016 1325   CL 101 01/08/2015 0850   CO2 27 12/04/2016 1325   CO2  26 01/08/2015 0850   GLUCOSE 149 (H) 12/04/2016 1325   GLUCOSE 161 (H) 01/08/2015 0850   BUN 43 (H) 12/04/2016 1325   BUN 17 01/08/2015 0850   CREATININE 0.61 12/04/2016 1325   CREATININE 0.82 01/22/2015 1559   CALCIUM 8.1 (L) 12/04/2016 1325   CALCIUM 9.3 01/08/2015 0850   PROT 6.0 (L) 12/04/2016 1325   PROT 6.8 01/22/2015 1559   ALBUMIN 2.9 (L) 12/04/2016 1325   ALBUMIN 3.9 01/22/2015 1559   AST 218 (H) 12/04/2016 1325   AST 34 01/22/2015 1559   ALT 463 (H) 12/04/2016 1325   ALT 42 01/22/2015 1559   ALKPHOS 373 (H) 12/04/2016 1325   ALKPHOS 157 (H) 01/22/2015 1559   BILITOT 2.2 (H) 12/04/2016 1325   BILITOT 0.2 (L) 01/22/2015 1559   GFRNONAA >60 12/04/2016 1325   GFRNONAA >60 01/22/2015 1559   GFRAA >60 12/04/2016 1325   GFRAA >60 01/22/2015 1559    No results found for: SPEP, UPEP  Lab Results  Component Value Date   WBC 38.7 (H) 12/04/2016   NEUTROABS 37.9 (H) 12/04/2016   HGB 14.4 12/04/2016   HCT 42.0 12/04/2016   MCV 92.2 12/04/2016   PLT 66 (L) 12/04/2016      Chemistry      Component Value Date/Time   NA 134 (L) 12/04/2016 1325   NA 135 01/08/2015 0850   K 5.1 12/04/2016 1325   K 3.7 01/08/2015 0850   CL 101 12/04/2016 1325   CL 101 01/08/2015 0850   CO2 27 12/04/2016 1325   CO2 26 01/08/2015 0850   BUN 43 (H) 12/04/2016 1325   BUN 17 01/08/2015 0850   CREATININE 0.61 12/04/2016 1325   CREATININE 0.82 01/22/2015 1559      Component Value Date/Time   CALCIUM 8.1 (L) 12/04/2016 1325   CALCIUM 9.3 01/08/2015 0850   ALKPHOS 373 (H) 12/04/2016 1325   ALKPHOS 157 (H) 01/22/2015 1559   AST 218 (H) 12/04/2016 1325   AST 34 01/22/2015 1559   ALT 463 (H) 12/04/2016 1325   ALT 42 01/22/2015 1559   BILITOT 2.2 (H) 12/04/2016 1325   BILITOT 0.2 (L) 01/22/2015 1559     IMPRESSION: Normal left ventricular wall motion with estimated ejection fraction of 65%.   Electronically Signed   By: Marijo Sanes M.D.   On: 07/27/2016 09:04  RADIOGRAPHIC  STUDIES: I have personally reviewed the radiological images as listed and agreed with the findings in the report. No results found.   ASSESSMENT & PLAN:  Carcinoma of upper-outer quadrant of left breast in female, estrogen receptor positive ([place]) # RECURRENT Metastatic breast cancer- ER/PR positive HER-2/neu negative [status post liver biopsy February 2018; previously HER-2/neu positive]. Currently s/p Taxotere Cytoxan   cycle #1 chemo appx 10 days ago.  # Bilateral Lower extremity - coldness- feeble pulses. recommend urgent ER evaluation- with arterial dopplers. Spoke to charge [employer] in ER.   #  Solitary Metastatic cancer to the brain- s/p  Radiation [11/25/2016].  Taper steroids dex 62m day.  # oral thrush- recommend continued nystatin   # PN G-2. Continue neurontin/cymblata.   # Elevated LFTs- secondary to involvement of  the liver. Chemo dose reduced; stable.   # follow up as planned for the next round of chemotherapy; or based on ER visit/evaluation.   No orders of the defined types were placed in this encounter.  All questions were answered. The patient knows to call the clinic with any problems, questions or concerns.      Cammie Sickle, MD 12/04/2016 4:19 PM

## 2016-12-04 NOTE — Progress Notes (Signed)
Patient here today for follow up. Patient c/o of swelling legs and unable to balance.

## 2016-12-04 NOTE — H&P (Signed)
[name]    MR#:  [id number]  DATE OF BIRTH:  Feb 07, 1956  DATE OF ADMISSION:  12/04/2016  PRIMARY CARE PHYSICIAN: Keith Rake, MD   REQUESTING/REFERRING PHYSICIAN: Dr Darl Householder  CHIEF COMPLAINT:   Bilateral lower extremity numbness and cold feet HISTORY OF PRESENT ILLNESS:  Sarah Horne  is a 62 y.o. female with a known history of Metastatic breast cancer was receiving chemotherapy at the cancer center was diagnosed with neuropathy bilateral lower extremities suspected due to cancer chemotherapy side effect comes to the emergency room from [place] after she was noted to have hold her feet during her visit of the cancer center. Patient did not have any palpable pulses in both lower extremities. She underwent CT angiogram of lower extremity which which showed reasonably normal flow to the Tibial vessels below this not much contrast is seen and per Dr. Lucky Cowboy daily) is very poor visualization tibial vessels even with good perfusion.  Given history of smoking heavily for long time and it presenting symptoms patient was started on IV heparin drip. Patient will also be continued on aspirin.  PAST MEDICAL HISTORY:   Past Medical History:  Diagnosis Date  . Breast cancer ([place]) 2016   Left-radiation  . Cancer ([place]) 2016   Stage II, ER positive, PR positive, HER-2/neu overexpressing of the left breast.  . Chemotherapy-induced peripheral neuropathy ([place])   . Heart attack 1998  . Hyperlipidemia   . Hypertension   . Neuromuscular disorder (HCC)    neuropathy  . Neuropathy ([place])     PAST SURGICAL HISTOIRY:   Past Surgical History:  Procedure Laterality Date  . BREAST BIOPSY Left 2016   Positive  . BREAST LUMPECTOMY WITH SENTINEL LYMPH NODE BIOPSY Left 05/23/2015   Procedure: LEFT BREAST WIDE EXCISION WITH AXILLARY DISSECTION, MASTOPLASTY ;  Surgeon: [name], MD;  Location: [facility] ORS;  Service: General;   Laterality: Left;  . BREAST SURGERY Left 12/18/14   breast biopsy/INVASIVE DUCTAL CARCINOMA OF BREAST, NOTTINGHAM GRADE 2.  Marland Kitchen BREAST SURGERY  05/23/2015.   Wide excision/mastoplasty, axillary dissection. No residual invasive cancer, positive for residual DCIS. 0/2 nodes identified on axillary dissection. (no SLN by technetium or methylene blue)  . CARDIAC CATHETERIZATION    . PORTACATH PLACEMENT Right 12-31-14   Dr Bary Castilla    SOCIAL HISTORY:   Social History  Substance Use Topics  . Smoking status: Current Every Day Smoker    Packs/day: 0.50    Years: 18.00    Types: Cigarettes  . Smokeless tobacco: Never Used  . Alcohol use No    FAMILY HISTORY:   Family History  Problem Relation Age of Onset  . Breast cancer Maternal Aunt   . Breast cancer Cousin   . Brain cancer Maternal Uncle     DRUG ALLERGIES:   Allergies  Allergen Reactions  . No Known Allergies     REVIEW OF SYSTEMS:  Review of Systems  Constitutional: Negative for chills, fever and weight loss.  HENT: Negative for ear discharge, ear pain and nosebleeds.   Eyes: Negative for blurred vision, pain and discharge.  Respiratory: Negative for sputum production, shortness of breath, wheezing and stridor.   Cardiovascular: Negative for chest pain, palpitations, orthopnea and PND.  Gastrointestinal: Negative for abdominal pain, diarrhea, nausea and vomiting.  Genitourinary: Negative for frequency and urgency.  Musculoskeletal: Negative for back pain and joint pain.  Neurological: Negative for sensory change, speech change,  focal weakness and weakness.  Psychiatric/Behavioral: Negative for depression and hallucinations. The patient is not nervous/anxious.      MEDICATIONS AT HOME:   Prior to Admission medications   Medication Sig Start Date End Date Taking? Authorizing Provider  anastrozole (ARIMIDEX) 1 MG tablet TAKE 1 TABLET DAILY 11/04/16  Yes Cammie Sickle, MD  aspirin 81 MG tablet Take 81 mg by mouth  daily.   Yes Historical Provider, MD  carvedilol (COREG) 6.25 MG tablet Take 1 tablet (6.25 mg total) by mouth 2 (two) times daily. 05/07/16  Yes [name], MD  dexamethasone (DECADRON) 4 MG tablet Take 1 tablet (4 mg total) by mouth 3 (three) times daily. 11/22/16  Yes Lequita Asal, MD  diphenoxylate-atropine (LOMOTIL) 2.5-0.025 MG tablet Take 1 tablet by mouth 4 (four) times daily as needed for diarrhea or loose stools. Take it along with immodium 09/07/16  Yes Cammie Sickle, MD  DULoxetine (CYMBALTA) 60 MG capsule TAKE 1 CAPSULE (60 MG TOTAL) BY MOUTH DAILY. 11/24/16  Yes Cammie Sickle, MD  nystatin (MYCOSTATIN) 100000 UNIT/ML suspension Use as directed 5 mLs in the mouth or throat 4 (four) times daily.  12/02/16  Yes Historical Provider, MD  ondansetron (ZOFRAN) 8 MG tablet Take 1 tablet (8 mg total) by mouth every 8 (eight) hours as needed for nausea or vomiting (start 3 days; after chemo). 11/26/16  Yes Cammie Sickle, MD  potassium chloride SA (K-DUR,KLOR-CON) 20 MEQ tablet 1 pill twice a day 10/28/16  Yes Cammie Sickle, MD  prochlorperazine (COMPAZINE) 10 MG tablet Take 1 tablet (10 mg total) by mouth every 6 (six) hours as needed for nausea or vomiting. 11/26/16  Yes Cammie Sickle, MD      VITAL SIGNS:  Blood pressure 102/72, pulse 73, temperature 97.4 F (36.3 C), temperature source Oral, resp. rate 18, height 5' 6"  (1.676 m), weight 66.2 kg (146 lb), SpO2 100 %.  PHYSICAL EXAMINATION:  GENERAL:  61 y.o.-year-old patient lying in the bed with no acute distress.  EYES: Pupils equal, round, reactive to light and accommodation. No scleral icterus. Extraocular muscles intact.  HEENT: Head atraumatic, normocephalic. Oropharynx and nasopharynx clear.  NECK:  Supple, no jugular venous distention. No thyroid enlargement, no tenderness.  LUNGS: Normal breath sounds bilaterally, no wheezing, rales,rhonchi or crepitation. No use of accessory muscles of  respiration.  CARDIOVASCULAR: S1, S2 normal. No murmurs, rubs, or gallops.  ABDOMEN: Soft, nontender, nondistended. Bowel sounds present. No organomegaly or mass.  EXTREMITIES: No pedal edema, cyanosis, or clubbing.  Bilateral lower extremity cold to touch. I could not appreciate any tibial or dorsalis pedis pulse. Skin appears normal some dryness in the plantar side. No ulcers noted. NEUROLOGIC: Cranial nerves II through XII are intact. Muscle strength 5/5 in all extremities. Sensation intact. Gait not checked.  PSYCHIATRIC: The patient is alert and oriented x 3.  SKIN: No obvious rash, lesion, or ulcer.   LABORATORY PANEL:   CBC  Recent Labs Lab 12/04/16 1629  WBC 46.0*  HGB 14.3  HCT 42.9  PLT 68*   ------------------------------------------------------------------------------------------------------------------  Chemistries   Recent Labs Lab 12/04/16 1629  NA 139  K 5.1  CL 105  CO2 28  GLUCOSE 140*  BUN 41*  CREATININE 0.62  CALCIUM 8.0*  AST 219*  ALT 474*  ALKPHOS 352*  BILITOT 2.1*   ------------------------------------------------------------------------------------------------------------------  Cardiac Enzymes No results for input(s): TROPONINI in the last 168 hours. ------------------------------------------------------------------------------------------------------------------  RADIOLOGY:  No results found.  EKG:    IMPRESSION AND PLAN:   Ryla Cauthon  is a 61 y.o. female with a known history of Metastatic breast cancer was receiving chemotherapy at the cancer center was diagnosed with neuropathy bilateral lower extremities suspected due to cancer chemotherapy side effect comes to the emergency room from [place] after she was noted to have hold her feet during her visit of the cancer center. Patient did not have any palpable pulses in both lower extremities.  1. Bilateral lower extremity cyanosis and pallor with cold feet and only  dopplerable pulses of present suspicion for peripheral arterial disease given history of tobacco abuse -IV heparin drip -Patient was seen by vascular surgery Dr. Lucky Cowboy. No vascular surgical intervention needed at this time -Recommends transitioning to aspirin and Plavix tomorrow  2. Metastatic breast cancer. Currently undergoing chemotherapy -Patient has history of peripheral neuropathy secondary side effects from chemotherapy  3. Tobacco abuse counseled more than 4 minutes for smoking cessation  4. DVT prophylaxis already on IV heparin drip  5. Hyperlipidemia continue statins  6. Hypertension on Coreg  Above was discussed with patient and patient's son was present in the emergency room case was discussed with Dr. Lucky Cowboy  All the records are reviewed and case discussed with ED provider. Management plans discussed with the patient, family and they are in agreement.  CODE STATUS: full  TOTAL TIME TAKING CARE OF THIS PATIENT:  64mnutes.    Clova Morlock M.D on 12/04/2016 at 5:56 PM  Between 7am to 6pm - Pager - [phone]  After 6pm go to [email or web address] - password [email or web address] [facility]  Office  [phone] CC: Primary care physician; SKeith Rake MD

## 2016-12-04 NOTE — Progress Notes (Signed)
ANTICOAGULATION CONSULT NOTE - Initial Consult  Pharmacy Consult for heparin bolus and drip Indication: possible arterial leg clot  Allergies  Allergen Reactions  . No Known Allergies     Patient Measurements: Height: 5' 6"  (167.6 cm) Weight: 146 lb (66.2 kg) IBW/kg (Calculated) : 59.3 Heparin Dosing Weight: 66.2 kg  Vital Signs: Temp: 97.4 F (36.3 C) (03/09 1457) Temp Source: Oral (03/09 1457) BP: 102/72 (03/09 1457) Pulse Rate: 73 (03/09 1457)  Labs:  Recent Labs  12/04/16 1325  HGB 14.4  HCT 42.0  PLT 66*  CREATININE 0.61    Estimated Creatinine Clearance: 68.3 mL/min (by C-G formula based on SCr of 0.61 mg/dL).   Medical History: Past Medical History:  Diagnosis Date  . Breast cancer ([place]) 2016   Left-radiation  . Cancer ([place]) 2016   Stage II, ER positive, PR positive, HER-2/neu overexpressing of the left breast.  . Chemotherapy-induced peripheral neuropathy ([place])   . Heart attack 1998  . Hyperlipidemia   . Hypertension   . Neuromuscular disorder (HCC)    neuropathy  . Neuropathy (H[facility])     Medications:  Scheduled:  . heparin  3,500 Units Intravenous Once   Infusions:  . heparin      Assessment: Pharmacy consulted to dose heparin bolus and drip in this 40 yoF with possible arterial leg clot. Pt has history of metastatic breast cancer to brain and bone presenting with worsening leg pain and numbness. Pt was at [facility] and was sent to ER for further evaluation for poor pulses. Per PTA med list patient is not taking oral anticoagulants. Baseline labs ordered. Patient's platelets are 66 today. Will follow closely.  Goal of Therapy:  Heparin level 0.3-0.7 units/ml Monitor platelets by anticoagulation protocol: Yes   Plan:  Give 3500 units bolus x 1 Start heparin infusion at 1100 units/hr Check anti-Xa level in 6 hours and daily while on heparin Continue to monitor H&H and platelets  Darrow Bussing, PharmD Pharmacy  Resident 12/04/2016 3:37 PM

## 2016-12-04 NOTE — Patient Instructions (Signed)
New instructions:  1. Take dexamethasone (DECADRON) 4 MG tablet once daily 2. Continue nystatin swish and swallow for the thrush.

## 2016-12-04 NOTE — Consult Note (Signed)
[facility] Consult Note  MRN : [id number]  Sarah Horne is a 61 y.o. ([birth year]) female who presents with chief complaint of  Chief Complaint  Patient presents with  . Possible arterial clot  .  History of Present Illness: I am asked to see the patient by Dr. Darl Householder in the ER regarding cold painful feet. The patient has noticed this for about a week. Her feet were extremely cold to the touch and she began having more discomfort when trying to ambulate. She has already been diagnosed with neuropathy from her chemotherapy for breast cancer. She has had this for over a year. She does not have ulceration or infection. There was no clear inciting event or causative factor that started the coldness in her feet. Nothing has really made this much better.  I have independently reviewed her CT angiogram and although the official report is not yet then, she has reasonably normal flow to the tibial vessels. Below this, not much contrast is seen but that is not surprising given her slow perfusion and a CT angiogram is very poor at visualizing the tibial vessels even with good perfusion.  Current Facility-Administered Medications  Medication Dose Route Frequency Provider Last Rate Last Dose  . heparin ADULT infusion 100 units/mL (25000 units/2104m sodium chloride 0.45%)  1,100 Units/hr Intravenous Continuous HMerilyn Baba RPH 11 mL/hr at 12/04/16 1724 1,100 Units/hr at 12/04/16 1724   Current Outpatient Prescriptions  Medication Sig Dispense Refill  . anastrozole (ARIMIDEX) 1 MG tablet TAKE 1 TABLET DAILY 90 tablet 3  . aspirin 81 MG tablet Take 81 mg by mouth daily.    . carvedilol (COREG) 6.25 MG tablet Take 1 tablet (6.25 mg total) by mouth 2 (two) times daily. 180 tablet 0  . dexamethasone (DECADRON) 4 MG tablet Take 1 tablet (4 mg total) by mouth 3 (three) times daily. 40 tablet 0  . diphenoxylate-atropine (LOMOTIL) 2.5-0.025 MG tablet Take 1 tablet by mouth 4  (four) times daily as needed for diarrhea or loose stools. Take it along with immodium 60 tablet 0  . DULoxetine (CYMBALTA) 60 MG capsule TAKE 1 CAPSULE (60 MG TOTAL) BY MOUTH DAILY. 30 capsule 0  . nystatin (MYCOSTATIN) 100000 UNIT/ML suspension     . ondansetron (ZOFRAN) 8 MG tablet Take 1 tablet (8 mg total) by mouth every 8 (eight) hours as needed for nausea or vomiting (start 3 days; after chemo). 40 tablet 1  . potassium chloride SA (K-DUR,KLOR-CON) 20 MEQ tablet 1 pill twice a day 14 tablet 3  . prochlorperazine (COMPAZINE) 10 MG tablet Take 1 tablet (10 mg total) by mouth every 6 (six) hours as needed for nausea or vomiting. 40 tablet 1   Facility-Administered Medications Ordered in Other Encounters  Medication Dose Route Frequency Provider Last Rate Last Dose  . 0.9 %  sodium chloride infusion   Intravenous Continuous SLeia Alf MD      . 0.9 %  sodium chloride infusion   Intravenous Continuous TLloyd Huger MD 999 mL/hr at 04/24/15 1520    . heparin lock flush 100 unit/mL  500 Units Intravenous Once Dmitriy Berenzon, MD      . sodium chloride 0.9 % injection 10 mL  10 mL Intravenous PRN SLeia Alf MD   10 mL at 04/04/15 1440  . sodium chloride flush (NS) 0.9 % injection 10 mL  10 mL Intravenous PRN Dmitriy Berenzon, MD      . sodium chloride flush (NS) 0.9 %  injection 10 mL  10 mL Intracatheter PRN Cammie Sickle, MD   10 mL at 11/26/16 1017    Past Medical History:  Diagnosis Date  . Breast cancer ([place]) 2016   Left-radiation  . Cancer ([place]) 2016   Stage II, ER positive, PR positive, HER-2/neu overexpressing of the left breast.  . Chemotherapy-induced peripheral neuropathy ([place])   . Heart attack 1998  . Hyperlipidemia   . Hypertension   . Neuromuscular disorder ([facility])    neuropathy  . Neuropathy [facility])     Past Surgical History:  Procedure Laterality Date  . BREAST BIOPSY Left 2016   Positive  . BREAST LUMPECTOMY WITH SENTINEL LYMPH NODE BIOPSY Left  05/23/2015   Procedure: LEFT BREAST WIDE EXCISION WITH AXILLARY DISSECTION, MASTOPLASTY ;  Surgeon: [name], MD;  Location: [facility] ORS;  Service: General;  Laterality: Left;  . BREAST SURGERY Left 12/18/14   breast biopsy/INVASIVE DUCTAL CARCINOMA OF BREAST, NOTTINGHAM GRADE 2.  Marland Kitchen BREAST SURGERY  05/23/2015.   Wide excision/mastoplasty, axillary dissection. No residual invasive cancer, positive for residual DCIS. 0/2 nodes identified on axillary dissection. (no SLN by technetium or methylene blue)  . CARDIAC CATHETERIZATION    . PORTACATH PLACEMENT Right 12-31-14   Dr Bary Castilla    Social History Social History  Substance Use Topics  . Smoking status: Current Every Day Smoker    Packs/day: 0.50    Years: 18.00    Types: Cigarettes  . Smokeless tobacco: Never Used  . Alcohol use No    Family History Family History  Problem Relation Age of Onset  . Breast cancer Maternal Aunt   . Breast cancer Cousin   . Brain cancer Maternal Uncle   No bleeding or clotting disorders  Allergies  Allergen Reactions  . No Known Allergies      REVIEW OF SYSTEMS (Negative unless checked)  Constitutional: _0 Weight loss  _1 Fever  _2 Chills Cardiac: _3 Chest pain   _4 Chest pressure   _5 Palpitations   _6 Shortness of breath when laying flat   _7 Shortness of breath at rest   _8 Shortness of breath with exertion. Vascular:  _9 Pain in legs with walking   _10 Pain in legs at rest   _11 Pain in legs when laying flat   _12 Claudication   _13 Pain in feet when walking  _14 Pain in feet at rest  _15 Pain in feet when laying flat   _16 History of DVT   _17 Phlebitis   _18 Swelling in legs   _19 Varicose veins   _20 Non-healing ulcers Pulmonary:   _21 Uses home oxygen   _22 Productive cough   _23 Hemoptysis   _24 Wheeze  _25 COPD   _26 Asthma Neurologic:  _27 Dizziness  _28 Blackouts   _29 Seizures   _30 History of stroke   _31 History of TIA  _32 Aphasia   _33 Temporary blindness   _34 Dysphagia   _35 Weakness or numbness in arms   _36 Weakness or numbness in  legs Musculoskeletal:  _37 Arthritis   _38 Joint swelling   _39 Joint pain   _40 Low back pain Hematologic:  _41 Easy bruising  _42 Easy bleeding   _43 Hypercoagulable state   _44 Anemic  _45 Hepatitis Gastrointestinal:  _46 Blood in stool   _47 Vomiting blood  _48 Gastroesophageal reflux/heartburn   _49 Difficulty swallowing. Genitourinary:  _50 Chronic kidney disease   _51 Difficult urination  _52 Frequent urination  _53 Burning with urination   _54 Blood in urine Skin:  _55 Rashes   _56 Ulcers   _57 Wounds Psychological:  _58 History of anxiety   _59  History of major depression.  Physical Examination  Vitals:   12/04/16 1457  BP: 102/72  Pulse: 73  Resp: 18  Temp: 97.4  F (36.3 C)  TempSrc: Oral  SpO2: 100%  Weight: 66.2 kg (146 lb)  Height: _0  (1.676 m)   Body mass index is 23.57 kg/m. Gen:  WD/WN, NAD. Appears older than stated age Head: /AT, No temporalis wasting. Ear/Nose/Throat: Hearing grossly intact, nares w/o erythema or drainage, oropharynx w/o Erythema/Exudate Eyes: Sclera non-icteric, conjunctiva clear Neck: Trachea midline.  No JVD.  Pulmonary:  Good air movement, respirations not labored, equal bilaterally.  Cardiac: RRR, normal S1, S2. Vascular:  Vessel Right Left  Radial Palpable Palpable  Ulnar Palpable Palpable  Brachial Palpable Palpable  Carotid Palpable, without bruit Palpable, without bruit  Aorta Not palpable N/A  Femoral Palpable Palpable  Popliteal Palpable Palpable  PT Not Palpable Not Palpable  DP Not Palpable Not Palpable   Gastrointestinal: soft, non-tender/non-distended. No guarding/reflex.  Musculoskeletal: M/S 5/5 throughout. Feet are very cool to the touch with slow capillary refill. No ulceration or infection. No deformity or atrophy. No edema. Neurologic: Sensation grossly intact in extremities.  Symmetrical.  Speech is fluent. Motor exam as listed above. Psychiatric: Judgment intact, Mood & affect appropriate for pt's clinical situation. Dermatologic: No rashes or ulcers  noted.  No cellulitis or open wounds. Lymph : No Cervical, Axillary, or Inguinal lymphadenopathy.      CBC Lab Results  Component Value Date   WBC 46.0 (H) 12/04/2016   HGB 14.3 12/04/2016   HCT 42.9 12/04/2016   MCV 92.0 12/04/2016   PLT 68 (L) 12/04/2016    BMET    Component Value Date/Time   NA 139 12/04/2016 1629   NA 135 01/08/2015 0850   K 5.1 12/04/2016 1629   K 3.7 01/08/2015 0850   CL 105 12/04/2016 1629   CL 101 01/08/2015 0850   CO2 28 12/04/2016 1629   CO2 26 01/08/2015 0850   GLUCOSE 140 (H) 12/04/2016 1629   GLUCOSE 161 (H) 01/08/2015 0850   BUN 41 (H) 12/04/2016 1629   BUN 17 01/08/2015 0850   CREATININE 0.62 12/04/2016 1629   CREATININE 0.82 01/22/2015 1559   CALCIUM 8.0 (L) 12/04/2016 1629   CALCIUM 9.3 01/08/2015 0850   GFRNONAA >60 12/04/2016 1629   GFRNONAA >60 01/22/2015 1559   GFRAA >60 12/04/2016 1629   GFRAA >60 01/22/2015 1559   Estimated Creatinine Clearance: 68.3 mL/min (by C-G formula based on SCr of 0.62 mg/dL).  COAG Lab Results  Component Value Date   INR 1.03 12/04/2016   INR 1.04 11/10/2016   INR 0.95 11/06/2016    Radiology Ct Head Wo Contrast  Result Date: 11/10/2016 CLINICAL DATA:  Headache. EXAM: CT HEAD WITHOUT CONTRAST TECHNIQUE: Contiguous axial images were obtained from the base of the skull through the vertex without intravenous contrast. COMPARISON:  None. FINDINGS: Brain: Ventricular size is within normal limits. There is no evidence of hemorrhage. White matter edema is noted in the left temporal and parietal lobes concerning for underlying mass lesion. There is approximately 7 mm of left-to-right midline shift. Vascular: No hyperdense vessel or unexpected calcification. Skull: Normal. Negative for fracture or focal lesion. Sinuses/Orbits: No acute finding. Other: None. IMPRESSION: White matter edema is noted in the left temporal and parietal lobes concerning for underlying mass lesion. MRI of the brain with and without  gadolinium administration is recommended for further evaluation. Electronically Signed   By: Marijo Conception, M.D.   On: 11/10/2016 08:25   Mr Brain W And Wo Contrast  Addendum Date: 11/10/2016   ADDENDUM REPORT: 11/10/2016 13:24 ADDENDUM: Sagittal T1 postcontrast  was inadvertently not performed. The patient returned approximately 90 minutes later for this sequence. Sagittal T1 postcontrast imaging reveals solitary left temporal enhancing lesion, no additional metastatic deposits in the brain. Electronically Signed   By: Franchot Gallo M.D.   On: 11/10/2016 13:24   Result Date: 11/10/2016 CLINICAL DATA:  Metastatic breast cancer.  Brain mass on CT. EXAM: MRI HEAD WITHOUT AND WITH CONTRAST TECHNIQUE: Multiplanar, multiecho pulse sequences of the brain and surrounding structures were obtained without and with intravenous contrast. CONTRAST:  6m MULTIHANCE GADOBENATE DIMEGLUMINE 529 MG/ML IV SOLN COMPARISON:  CT head 11/10/2016 FINDINGS: Brain: Enhancing mass lesion in the left mid temporal lobe measures 19 x 18 mm and is consistent with metastatic disease. There is extensive vasogenic edema surrounding the mass lesion. Mild shift of the midline structures measuring 5 mm to the right. Negative for hemorrhage. No other enhancing metastatic deposits identified. Ventricles normal in size. Negative for acute infarct. Mild chronic microvascular ischemic change in the cerebral white matter and pons. Vascular: Normal arterial flow voids. Hypoplastic basilar due to fetal origin of the posterior cerebral artery bilaterally. Skull and upper cervical spine: No skeletal lesions identified. Sinuses/Orbits: Negative none Other: None IMPRESSION: Metastatic deposit left temporal lobe with extensive surrounding edema and 5 mm midline shift to the right. The mass measures 19 x 18 mm. No other metastatic deposits identified in the brain. Electronically Signed: By: CFranchot GalloM.D. On: 11/10/2016 12:23   Nm Cardiac Muga  Rest  Result Date: 11/16/2016 CLINICAL DATA:  Cardiomyopathy secondary to chemotherapy EXAM: NUCLEAR MEDICINE CARDIAC BLOOD POOL IMAGING (MUGA) TECHNIQUE: Cardiac multi-gated acquisition was performed at rest following intravenous injection of Tc-918mabeled red blood cells. RADIOPHARMACEUTICALS:  19.226 mCi [id number] in-vitro labeled autologous red blood cells IV COMPARISON:  07/24/2016 FINDINGS: LEFT ventricular ejection fraction is calculated at 61%, minimally decreased from the 65% calculated on the previous exam. Study was obtained at a cardiac rate 87 beats per minute. Patient was fairly rhythmic during acquisition, with few rejected beats. Wall motion analysis of the LEFT ventricle in 3 projections is normal. IMPRESSION: Normal LEFT ventricular ejection fraction of 61%, minimally decreased from 65% on 07/24/2016. Normal LV wall motion. Electronically Signed   By: MarLavonia DanaD.   On: 11/16/2016 07:56   [place]  Result Date: 11/06/2016 INDICATION: [age]ar old with history of left breast cancer and multiple liver lesions. Patient needs tissue sampling of the liver. EXAM: ULTRASOUND-GUIDED BIOPSY OF LIVER LESION MEDICATIONS: None. ANESTHESIA/SEDATION: Moderate (conscious) sedation was employed during this procedure. A total of Versed 1.5 mg and Fentanyl 50 mcg was administered intravenously. Moderate Sedation Time: 24 minutes. The patient's level of consciousness and vital signs were monitored continuously by radiology nursing throughout the procedure under my direct supervision. FLUOROSCOPY TIME:  None COMPLICATIONS: None immediate. PROCEDURE: Informed written consent was obtained from the patient after a thorough discussion of the procedural risks, benefits and alternatives. All questions were addressed. A timeout was performed prior to the initiation of the procedure. Liver was evaluated with ultrasound. The right hepatic lobe was selected for biopsy. Right side of the abdomen was prepped  with chlorhexidine and a sterile field was created. The skin and soft tissues were anesthetized with 1% lidocaine. 17 gauge needle was directed into a right hepatic lesion with ultrasound guidance. A total of 5 core biopsies were performed with an 18 gauge device. Three of the biopsies yielded adequate specimens and specimens were placed in formalin. 17 gauge needle was removed without complication.  Bandage placed over the puncture site. FINDINGS: Numerous subtle lesions scattered throughout the liver parenchyma. Slightly hyperechoic lesion in the inferior right hepatic lobe was targeted for biopsy. Biopsy needle was confirmed within the lesion. IMPRESSION: Ultrasound-guided core biopsies of a right hepatic lesion. Electronically Signed   By: Markus Daft M.D.   On: 11/06/2016 13:14      Assessment/Plan 1. Bilateral lower extremity cyanosis and pallor. Her feet are very cold and only Doppler pulses are present, but in reviewing her CT angiogram that does not appear to be a significant proximal occlusion. I have independently reviewed her CT angiogram and although the official report is not yet then, she has reasonably normal flow to the tibial vessels. Below this, not much contrast is seen but that is not surprising given her slow perfusion and a CT angiogram is very poor at visualizing the tibial vessels even with good perfusion. I do not think she will need any vascular surgical intervention. If her occlusions are out that far distal, the only real option would be anticoagulation anyway with limited abilities to perform any revascularization. I think continuing her on anticoagulation overnight and then considering transitioning to aspirin and Plavix tomorrow would be reasonable. There is likely a vasospastic component both from the cold weather as well as for chemotherapy. 2. Metastatic breast cancer. Currently undergoing chemotherapy. Likely creating neuropathy and vasospasm. 3. Tobacco use disorder.  Certainly a risk factor for atherosclerosis. She had surprisingly little atherosclerosis on her CT scan. She could have some very small vessel disease secondary to her tobacco use. Cessation would be of benefit.   Leotis Pain, MD  12/04/2016 5:31 PM    This note was created with Dragon medical transcription system.  Any error is purely unintentional

## 2016-12-04 NOTE — ED Provider Notes (Signed)
[facility] Provider Note   CSN: [id number] Arrival date & time: 12/04/16  1437     History   Chief Complaint Chief Complaint  Patient presents with  . Possible arterial clot    HPI Sarah Horne is a 61 y.o. female history of metastatic breast cancer to the brain and bone and liver here presenting with worsening leg pain, leg numbness. Patient states that for the last 2-3 days she has bilateral leg pain and numbness. Also has trouble walking due to the numbness and weakness. Patient states that her legs feel cold. Denies any abdominal pain or chest pain. Patient went to [place] and had poor pulses so sent here for vascular evaluation. Patient denies any history of diabetes but does have history of hypertension. Patient states that she had chemotherapy induced neuropathy and last chemotherapy was about week ago.  The history is provided by the patient.    Past Medical History:  Diagnosis Date  . Breast cancer ([place]) 2016   Left-radiation  . Cancer ([place]) 2016   Stage II, ER positive, PR positive, HER-2/neu overexpressing of the left breast.  . Chemotherapy-induced peripheral neuropathy ([place])   . Heart attack 1998  . Hyperlipidemia   . Hypertension   . Neuromuscular disorder (HCC)    neuropathy  . Neuropathy [facility])     Patient Active Problem List   Diagnosis Date Noted  . Chemotherapy induced neutropenia ([place]) 11/20/2016  . Palliative care by specialist   . Goals of care, counseling/discussion   . DNR (do not resuscitate) discussion   . Cerebral edema ([place]) 11/10/2016  . Encounter for monitoring cardiotoxic drug therapy 11/09/2016  . Elevated LFTs 10/20/2016  . URI with cough and congestion 08/25/2016  . Peripheral neuropathy due to chemotherapy ([place]) 03/13/2016  . Encounter for antineoplastic chemotherapy 03/13/2016  . Hyperlipidemia 01/29/2016  . Hyperglycemia 01/29/2016  . Cardiomyopathy ([place]) 01/13/2016  . Hypertension 07/18/2015  . Carcinoma  of upper-outer quadrant of left breast in female, estrogen receptor positive ([place]) 12/27/2014    Past Surgical History:  Procedure Laterality Date  . BREAST BIOPSY Left 2016   Positive  . BREAST LUMPECTOMY WITH SENTINEL LYMPH NODE BIOPSY Left 05/23/2015   Procedure: LEFT BREAST WIDE EXCISION WITH AXILLARY DISSECTION, MASTOPLASTY ;  Surgeon: [name], MD;  Location: [facility] ORS;  Service: General;  Laterality: Left;  . BREAST SURGERY Left 12/18/14   breast biopsy/INVASIVE DUCTAL CARCINOMA OF BREAST, NOTTINGHAM GRADE 2.  Marland Kitchen BREAST SURGERY  05/23/2015.   Wide excision/mastoplasty, axillary dissection. No residual invasive cancer, positive for residual DCIS. 0/2 nodes identified on axillary dissection. (no SLN by technetium or methylene blue)  . CARDIAC CATHETERIZATION    . PORTACATH PLACEMENT Right 12-31-14   Dr Bary Castilla    OB History    Gravida Para Term Preterm AB Living   1             SAB TAB Ectopic Multiple Live Births                  Obstetric Comments   1st Menstrual Cycle:  10 1st Pregnancy:  32        Home Medications    Prior to Admission medications   Medication Sig Start Date End Date Taking? Authorizing Provider  anastrozole (ARIMIDEX) 1 MG tablet TAKE 1 TABLET DAILY 11/04/16   Cammie Sickle, MD  aspirin 81 MG tablet Take 81 mg by mouth daily.    Historical Provider, MD  carvedilol (COREG) 6.25  MG tablet Take 1 tablet (6.25 mg total) by mouth 2 (two) times daily. 05/07/16   [name], MD  dexamethasone (DECADRON) 4 MG tablet Take 1 tablet (4 mg total) by mouth 3 (three) times daily. 11/22/16   Lequita Asal, MD  diphenoxylate-atropine (LOMOTIL) 2.5-0.025 MG tablet Take 1 tablet by mouth 4 (four) times daily as needed for diarrhea or loose stools. Take it along with immodium 09/07/16   Cammie Sickle, MD  DULoxetine (CYMBALTA) 60 MG capsule TAKE 1 CAPSULE (60 MG TOTAL) BY MOUTH DAILY. 11/24/16   Cammie Sickle, MD  nystatin (MYCOSTATIN)  100000 UNIT/ML suspension  12/02/16   Historical Provider, MD  ondansetron (ZOFRAN) 8 MG tablet Take 1 tablet (8 mg total) by mouth every 8 (eight) hours as needed for nausea or vomiting (start 3 days; after chemo). 11/26/16   Cammie Sickle, MD  potassium chloride SA (K-DUR,KLOR-CON) 20 MEQ tablet 1 pill twice a day 10/28/16   Cammie Sickle, MD  prochlorperazine (COMPAZINE) 10 MG tablet Take 1 tablet (10 mg total) by mouth every 6 (six) hours as needed for nausea or vomiting. 11/26/16   Cammie Sickle, MD    Family History Family History  Problem Relation Age of Onset  . Breast cancer Maternal Aunt   . Breast cancer Cousin   . Brain cancer Maternal Uncle     Social History Social History  Substance Use Topics  . Smoking status: Current Every Day Smoker    Packs/day: 0.50    Years: 18.00    Types: Cigarettes  . Smokeless tobacco: Never Used  . Alcohol use No     Allergies   No known allergies   Review of Systems Review of Systems  Neurological: Positive for weakness and numbness.  All other systems reviewed and are negative.    Physical Exam Updated Vital Signs BP 102/72 (BP Location: Right Arm)   Pulse 73   Temp 97.4 F (36.3 C) (Oral)   Resp 18   Ht 5' 6" (1.676 m)   Wt 146 lb (66.2 kg)   SpO2 100%   BMI 23.57 kg/m   Physical Exam  Constitutional: She is oriented to person, place, and time.  Uncomfortable   HENT:  Head: Normocephalic.  Eyes: EOM are normal. Pupils are equal, round, and reactive to light.  Neck: Normal range of motion. Neck supple.  Cardiovascular: Normal rate, regular rhythm and normal heart sounds.   Pulmonary/Chest: Effort normal and breath sounds normal. No respiratory distress. She has no wheezes.  Abdominal: Soft. Bowel sounds are normal. She exhibits no distension.  Musculoskeletal:  Bilateral legs cold. R foot colder than L. No palpable DP or PT pulses bilaterally. Thready R popliteal pulse, good L popliteal pulse. +  bilateral femoral pulses. No pulsatile abdominal mass. On doppler, thready bilateral DP pulses, especially on the L. No dopplerable PT pulses bilaterally   Neurological: She is alert and oriented to person, place, and time. No cranial nerve deficit. Coordination normal.  Skin: Skin is warm.  Psychiatric: She has a normal mood and affect.  Nursing note and vitals reviewed.    ED Treatments / Results  Labs (all labs ordered are listed, but only abnormal results are displayed) Labs Reviewed  CBC WITH DIFFERENTIAL/PLATELET  COMPREHENSIVE METABOLIC PANEL  PROTIME-INR    EKG  EKG Interpretation None       Radiology No results found.  Procedures Procedures (including critical care time)  CRITICAL CARE Performed by: Shanon Brow  Tamsen Meek   Total critical care time: 30 minutes  Critical care time was exclusive of separately billable procedures and treating other patients.  Critical care was necessary to treat or prevent imminent or life-threatening deterioration.  Critical care was time spent personally by me on the following activities: development of treatment plan with patient and/or surrogate as well as nursing, discussions with consultants, evaluation of patient's response to treatment, examination of patient, obtaining history from patient or surrogate, ordering and performing treatments and interventions, ordering and review of laboratory studies, ordering and review of radiographic studies, pulse oximetry and re-evaluation of patient's condition.   Medications Ordered in ED Medications - No data to display   Initial Impression / Assessment and Plan / ED Course  I have reviewed the triage vital signs and the nursing notes.  Pertinent labs & imaging results that were available during my care of the patient were reviewed by me and considered in my medical decision making (see chart for details).     Sarah Horne is a 61 y.o. female here with bilateral leg numbness. Both  legs cold, no palpable pulses. Thready dopplerable pulses bilateral DP. Will get labs, consult vascular surgery. Will start heparin and order CT angio aortobifemoral unless vascular wants to perform urgent angiogram in the OR.   3:20 pm Talked to Dr. Lucky Cowboy, who agrees with CT angio. Will start heparin.   5:35 PM Dr. Lucky Cowboy saw patient and reviewed CT and didn't see any obvious large arterial occlusion. On heparin drip now. Hospitalist to admit.   Final Clinical Impressions(s) / ED Diagnoses   Final diagnoses:  None    New Prescriptions New Prescriptions   No medications on file     Drenda Freeze, MD 12/04/16 1736

## 2016-12-05 LAB — CBC
HCT: 38.9 % (ref 35.0–47.0)
Hemoglobin: 12.7 g/dL (ref 12.0–16.0)
MCH: 30.4 pg (ref 26.0–34.0)
MCHC: 32.7 g/dL (ref 32.0–36.0)
MCV: 92.9 fL (ref 80.0–100.0)
PLATELETS: 68 10*3/uL — AB (ref 150–440)
RBC: 4.18 MIL/uL (ref 3.80–5.20)
RDW: 16 % — AB (ref 11.5–14.5)
WBC: 53.6 10*3/uL (ref 3.6–11.0)

## 2016-12-05 LAB — HEPARIN LEVEL (UNFRACTIONATED)
HEPARIN UNFRACTIONATED: 1.12 [IU]/mL — AB (ref 0.30–0.70)
HEPARIN UNFRACTIONATED: 0.82 [IU]/mL — AB (ref 0.30–0.70)
HEPARIN UNFRACTIONATED: 0.41 [IU]/mL (ref 0.30–0.70)

## 2016-12-05 MED ORDER — NYSTATIN 100000 UNIT/ML MT SUSP: 5.0000 mL | Freq: Four times a day (QID) | OROMUCOSAL | Status: DC

## 2016-12-05 MED ORDER — HEPARIN (PORCINE) IN NACL 100-0.45 UNIT/ML-% IJ SOLN
700.0000 [IU]/h | INTRAMUSCULAR | Status: DC
  Administered 2016-12-05: 02:00:00 850 [IU]/h via INTRAVENOUS
  Filled 2016-12-05: qty 250

## 2016-12-05 MED ORDER — NYSTATIN 100000 UNIT/ML MT SUSP
5.0000 mL | Freq: Four times a day (QID) | OROMUCOSAL | Status: DC
  Administered 2016-12-05 – 2016-12-10 (×18): 500000 [IU] via ORAL
  Filled 2016-12-05 (×18): qty 5

## 2016-12-05 MED ORDER — DEXAMETHASONE 4 MG PO TABS
4.0000 mg | ORAL_TABLET | Freq: Three times a day (TID) | ORAL | Status: DC
  Administered 2016-12-05 – 2016-12-09 (×10): 4 mg via ORAL
  Filled 2016-12-05 (×13): qty 1

## 2016-12-05 MED ORDER — CLOPIDOGREL BISULFATE 75 MG PO TABS
75.0000 mg | ORAL_TABLET | Freq: Every day | ORAL | Status: DC
  Administered 2016-12-05 – 2016-12-06 (×2): 75 mg via ORAL
  Filled 2016-12-05 (×2): qty 1

## 2016-12-05 NOTE — Progress Notes (Signed)
ANTICOAGULATION CONSULT NOTE - Initial Consult  Pharmacy Consult for heparin bolus and drip Indication: possible arterial leg clot  Allergies  Allergen Reactions  . No Known Allergies     Patient Measurements: Height: _0  (167.6 cm) Weight: 158 lb 3.2 oz (71.8 kg) (pt had several blanket on bed @ this time ) IBW/kg (Calculated) : 59.3 Heparin Dosing Weight: 66.2 kg  Vital Signs: Temp: 97.6 F (36.4 C) (03/09 2007) Temp Source: Axillary (03/09 2007) BP: 124/72 (03/09 2007) Pulse Rate: 73 (03/09 2007)  Labs:  Recent Labs  12/04/16 1325 12/04/16 1629 12/04/16 2320  HGB 14.4 14.3  --   HCT 42.0 42.9  --   PLT 66* 68* 65*  APTT  --  24  --   LABPROT  --  13.5  --   INR  --  1.03  --   HEPARINUNFRC  --   --  1.12*  CREATININE 0.61 0.62  --     Estimated Creatinine Clearance: 74 mL/min (by C-G formula based on SCr of 0.62 mg/dL).   Medical History: Past Medical History:  Diagnosis Date  . Breast cancer ([place]) 2016   Left-radiation  . Cancer ([place]) 2016   Stage II, ER positive, PR positive, HER-2/neu overexpressing of the left breast.  . Chemotherapy-induced peripheral neuropathy ([place])   . Heart attack 1998  . Hyperlipidemia   . Hypertension   . Neuromuscular disorder (HCC)    neuropathy  . Neuropathy (HCC)     Medications:  Scheduled:  . anastrozole  1 mg Oral Daily  . aspirin EC  81 mg Oral Daily  . carvedilol  6.25 mg Oral BID  . DULoxetine  60 mg Oral Daily  . potassium chloride SA  20 mEq Oral BID   Infusions:  . heparin      Assessment: Pharmacy consulted to dose heparin bolus and drip in this 74 yoF with possible arterial leg clot. Pt has history of metastatic breast cancer to brain and bone presenting with worsening leg pain and numbness. Pt was at [facility] and was sent to ER for further evaluation for poor pulses. Per PTA med list patient is not taking oral anticoagulants. Baseline labs ordered. Patient's platelets are 66 today. Will  follow closely.  Goal of Therapy:  Heparin level 0.3-0.7 units/ml Monitor platelets by anticoagulation protocol: Yes   Plan:  Give 3500 units bolus x 1 Start heparin infusion at 1100 units/hr Check anti-Xa level in 6 hours and daily while on heparin Continue to monitor H&H and platelets   3/9 23:30 heparin level 1.12, Hold infusion x 1 hour and restart at 850 units/hr. Recheck heparin level 6 hours after restart.Darrow Bussing, PharmD Pharmacy Resident 12/05/2016 12:19 AM

## 2016-12-05 NOTE — Progress Notes (Signed)
Pt WBC count critical at 53.6 this am. Notified Dr Marcille Blanco. Pt on Decadron per outpatient med list tid. Dr Marcille Blanco said he thought that the WBC are elevated due to pt cancer treatments. No new orders were given. Will continue to monitor.

## 2016-12-05 NOTE — NC FL2 (Signed)
[place] LEVEL OF CARE SCREENING TOOL     IDENTIFICATION  Patient Name: Sarah Horne Birthdate: 02-Nov-1955 Sex: female Admission Date (Current Location): 12/04/2016  [place] and [place] Number:  Engineering geologist and Address:  [facility], [address], [place], [place] [address]      Provider Number: [id number]  Attending Physician Name and Address:  Fritzi Mandes, MD  Relative Name and Phone Number:       Current Level of Care: Hospital Recommended Level of Care: Nursing Facility Prior Approval Number:    Date Approved/Denied: 12/05/16 PASRR Number: [id number]  Discharge Plan: SNF    Current Diagnoses: Patient Active Problem List   Diagnosis Date Noted  . Ischemic leg 12/04/2016  . Chemotherapy induced neutropenia ([place]) 11/20/2016  . Palliative care by specialist   . Goals of care, counseling/discussion   . DNR (do not resuscitate) discussion   . Cerebral edema ([place]) 11/10/2016  . Encounter for monitoring cardiotoxic drug therapy 11/09/2016  . Elevated LFTs 10/20/2016  . URI with cough and congestion 08/25/2016  . Peripheral neuropathy due to chemotherapy ([place]) 03/13/2016  . Encounter for antineoplastic chemotherapy 03/13/2016  . Hyperlipidemia 01/29/2016  . Hyperglycemia 01/29/2016  . Cardiomyopathy ([place]) 01/13/2016  . Hypertension 07/18/2015  . Carcinoma of upper-outer quadrant of left breast in female, estrogen receptor positive ([place]) 12/27/2014    Orientation RESPIRATION BLADDER Height & Weight     Self, Time, Situation, Place  Normal Continent Weight: 158 lb 3.2 oz (71.8 kg) (pt had several blanket on bed @ this time ) Height:  '5\' 6"'$  (167.6 cm)  BEHAVIORAL SYMPTOMS/MOOD NEUROLOGICAL BOWEL NUTRITION STATUS      Continent    AMBULATORY STATUS COMMUNICATION OF NEEDS Skin   Extensive Assist Verbally Normal                       Personal Care Assistance Level of Assistance  Bathing, Feeding, Dressing  Bathing Assistance: Limited assistance Feeding assistance: Independent Dressing Assistance: Limited assistance     Functional Limitations Info             SPECIAL CARE FACTORS FREQUENCY  PT (By licensed PT), OT (By licensed OT)     PT Frequency: Up to 5X per day, 5 days per week OT Frequency: Up to 5X per day, 5 days per week            Contractures Contractures Info: Present    Additional Factors Info                  Current Medications (12/05/2016):  This is the current hospital active medication list Current Facility-Administered Medications  Medication Dose Route Frequency Provider Last Rate Last Dose  . acetaminophen (TYLENOL) tablet 650 mg  650 mg Oral Q6H PRN Fritzi Mandes, MD       Or  . acetaminophen (TYLENOL) suppository 650 mg  650 mg Rectal Q6H PRN Fritzi Mandes, MD      . anastrozole (ARIMIDEX) tablet 1 mg  1 mg Oral Daily Fritzi Mandes, MD   1 mg at 12/05/16 0926  . aspirin EC tablet 81 mg  81 mg Oral Daily Fritzi Mandes, MD   81 mg at 12/05/16 0926  . carvedilol (COREG) tablet 6.25 mg  6.25 mg Oral BID Fritzi Mandes, MD   6.25 mg at 12/05/16 0926  . clopidogrel (PLAVIX) tablet 75 mg  75 mg Oral Daily Fritzi Mandes, MD   75 mg at  12/05/16 0805  . dexamethasone (DECADRON) tablet 4 mg  4 mg Oral TID Fritzi Mandes, MD   4 mg at 12/05/16 1030  . diphenoxylate-atropine (LOMOTIL) 2.5-0.025 MG per tablet 1 tablet  1 tablet Oral QID PRN Fritzi Mandes, MD      . DULoxetine (CYMBALTA) DR capsule 60 mg  60 mg Oral Daily Fritzi Mandes, MD   60 mg at 12/05/16 0926  . heparin ADULT infusion 100 units/mL (25000 units/265m sodium chloride 0.45%)  700 Units/hr Intravenous Continuous DDrenda Freeze MD 7 mL/hr at 12/05/16 0838 700 Units/hr at 12/05/16 0838  . nystatin (MYCOSTATIN) 100000 UNIT/ML suspension 500,000 Units  5 mL Oral QID MHarrie Foreman MD   500,000 Units at 12/05/16 [phone] . ondansetron (ZOFRAN) tablet 8 mg  8 mg Oral Q8H PRN SFritzi Mandes MD      . potassium chloride SA  (K-DUR,KLOR-CON) CR tablet 20 mEq  20 mEq Oral BID SFritzi Mandes MD   20 mEq at 12/05/16 0926  . prochlorperazine (COMPAZINE) tablet 10 mg  10 mg Oral Q6H PRN SFritzi Mandes MD      . senna-docusate (Senokot-S) tablet 1 tablet  1 tablet Oral QHS PRN SFritzi Mandes MD      . traMADol (Veatrice Bourbon tablet 50 mg  50 mg Oral Q6H PRN SFritzi Mandes MD       Facility-Administered Medications Ordered in Other Encounters  Medication Dose Route Frequency Provider Last Rate Last Dose  . 0.9 %  sodium chloride infusion   Intravenous Continuous SLeia Alf MD      . 0.9 %  sodium chloride infusion   Intravenous Continuous TLloyd Huger MD 999 mL/hr at 04/24/15 1520    . heparin lock flush 100 unit/mL  500 Units Intravenous Once Dmitriy Berenzon, MD      . sodium chloride 0.9 % injection 10 mL  10 mL Intravenous PRN SLeia Alf MD   10 mL at 04/04/15 1440  . sodium chloride flush (NS) 0.9 % injection 10 mL  10 mL Intravenous PRN Dmitriy Berenzon, MD      . sodium chloride flush (NS) 0.9 % injection 10 mL  10 mL Intracatheter PRN GCammie Sickle MD   10 mL at 11/26/16 07654    Discharge Medications: Please see discharge summary for a list of discharge medications.  Relevant Imaging Results:  Relevant Lab Results:   Additional Information SS# [id number] KZettie Pho LCSW

## 2016-12-05 NOTE — Clinical Social Work Note (Signed)
Clinical Social Work Assessment  Patient Details  Name: Sarah Horne MRN: [id number] Date of Birth: [birth year]  Date of referral:  12/05/16               Reason for consult:  Facility Placement                Permission sought to share information with:  Chartered certified accountant granted to share information::  Yes, Verbal Permission Granted  Name::        Agency::     Relationship::     Contact Information:     Housing/Transportation Living arrangements for the past 2 months:  Single Family Home Source of Information:  Patient Patient Interpreter Needed:  None Criminal Activity/Legal Involvement Pertinent to Current Situation/Hospitalization:  No - Comment as needed Significant Relationships:  Siblings Lives with:  Self Do you feel safe going back to the place where you live?  Yes Need for family participation in patient care:  No (Coment)  Care giving concerns:  PT rec for SNF   Social Worker assessment / plan:  CSW met with the patient at bedside to discuss DC planning. The patient gave verbal permission to conduct a SNF bed search in [facility] only.   According to the patient, she lives alone. At baseline, she ambulates independently, is continent of both bladder and bowel, and she manages her finances and medications. The client does not drive. The client is able to feed/dress/bathe herself independently.  The client is unsure as to her preference for SNF. CSW provided a list of facilities and explained how the referral process works. According to the attending, the patient will possibly dc on Monday 3/12.  Employment status:  Engineer, mining information:  Public librarian) PT Recommendations:  [place] / Referral to community resources:  [place]  Patient/Family's Response to care:  Patient thanked CSW and was cooperative with the interview.  Patient/Family's Understanding of and Emotional  Response to Diagnosis, Current Treatment, and Prognosis:  The patient verbalized that she is in agreement with the dc plan to SNF for STR.  Emotional Assessment Appearance:  Appears stated age Attitude/Demeanor/Rapport:  Lethargic Affect (typically observed):  Appropriate, Pleasant Orientation:  Oriented to Self, Oriented to Place, Oriented to  Time, Oriented to Situation Alcohol / Substance use:  Never Used Psych involvement (Current and /or in the community):  No (Comment)  Discharge Needs  Concerns to be addressed:  Care Coordination Readmission within the last 30 days:  No Current discharge risk:  None Barriers to Discharge:  Continued Medical Work up   [employer], LCSW 12/05/2016, 3:21 PM

## 2016-12-05 NOTE — Clinical Social Work Placement (Signed)
   CLINICAL SOCIAL WORK PLACEMENT  NOTE  Date:  12/05/2016  Patient Details  Name: Sarah Horne MRN: [id number] Date of Birth: [birth year]  Clinical Social Work is seeking post-discharge placement for this patient at the [place] level of care (*CSW will initial, date and re-position this form in  chart as items are completed):  Yes   Patient/family provided with [facility] Work Department's list of facilities offering this level of care within the geographic area requested by the patient (or if unable, by the patient's family).  Yes   Patient/family informed of their freedom to choose among providers that offer the needed level of care, that participate in Medicare, Medicaid or managed care program needed by the patient, have an available bed and are willing to accept the patient.  Yes   Patient/family informed of [facility]'s ownership interest in [facility] and [facility], as well as of the fact that they are under no obligation to receive care at these facilities.  PASRR submitted to EDS on       PASRR number received on       Existing PASRR number confirmed on 12/05/16     FL2 transmitted to all facilities in geographic area requested by pt/family on 12/05/16     FL2 transmitted to all facilities within larger geographic area on       Patient informed that his/her managed care company has contracts with or will negotiate with certain facilities, including the following:            Patient/family informed of bed offers received.  Patient chooses bed at       Physician recommends and patient chooses bed at      Patient to be transferred to   on  .  Patient to be transferred to facility by       Patient family notified on   of transfer.  Name of family member notified:        PHYSICIAN       Additional Comment:    _______________________________________________ Zettie Pho, LCSW 12/05/2016, 3:24 PM

## 2016-12-05 NOTE — Evaluation (Signed)
Physical Therapy Evaluation Patient Details Name: Sarah Horne MRN: [id number] DOB: [birth year] Today's Date: 12/05/2016   History of Present Illness  Pt is a 61 y.o. female presenting from [place] [facility] with cold painful feet.  Pt admitted with B LE cyanosis and pallor.  PMH includes metastatic breast CA to brain, bone, and liver, chemo induced neutropenia, and peripheral neuropathy.  Clinical Impression  Prior to hospital admission, pt was independent with functional mobility.  Pt lives alone in 1 level home with 3 steps to enter.  Currently pt is mod to max assist with bed mobility; max assist to stand with RW; and pt unable to ambulate with RW d/t overall fatigue/weakness.  Pt would benefit from skilled PT to address noted impairments and functional limitations.  Recommend pt discharge to STR when medically appropriate.    Follow Up Recommendations SNF    Equipment Recommendations  Rolling walker with 5" wheels    Recommendations for Other Services OT consult     Precautions / Restrictions Precautions Precautions: Fall Precaution Comments: Metastatic disease to bones and brain; port-a-cath Restrictions Weight Bearing Restrictions: No      Mobility  Bed Mobility Overal bed mobility: Needs Assistance Bed Mobility: Supine to Sit;Sit to Supine     Supine to sit: Mod assist;HOB elevated Sit to supine: Max assist;HOB elevated   General bed mobility comments: assist for R LE and trunk supine to sit; assist for trunk and B LE's sit to supine; 2 assist to boost pt up in bed  Transfers Overall transfer level: Needs assistance Equipment used: Rolling walker (2 wheeled) Transfers: Sit to/from Stand Sit to Stand: Max assist         General transfer comment: pt unable to stand 1st trial with maximum assist of therapist; with cueing able to stand with max assist 2nd attempt; vc's for hand placement required  Ambulation/Gait Ambulation/Gait assistance: Mod assist   Assistive  device: Rolling walker (2 wheeled)       General Gait Details: pt able to shift weight R and L with support but unable to march in place to advance LE's for ambulation  Stairs            Wheelchair Mobility    Modified Rankin (Stroke Patients Only)       Balance Overall balance assessment: Needs assistance Sitting-balance support: Bilateral upper extremity supported;Feet supported Sitting balance-Leahy Scale: Fair Sitting balance - Comments: static sitting   Standing balance support: Bilateral upper extremity supported (on RW) Standing balance-Leahy Scale: Poor Standing balance comment: posterior lean initially with standing                             Pertinent Vitals/Pain Pain Assessment: 0-10 Pain Score: 3  Pain Location: B feet Pain Descriptors / Indicators: Aching Pain Intervention(s): Limited activity within patient's tolerance;Monitored during session;Repositioned  Vitals (HR and O2 on room air) stable and WFL throughout treatment session.    Home Living Family/patient expects to be discharged to:: Private residence Living Arrangements: Alone   Type of Home: House Home Access: Stairs to enter Entrance Stairs-Rails: None Entrance Stairs-Number of Steps: 3 Home Layout: One level Home Equipment: None      Prior Function Level of Independence: Independent         Comments: Pt reports 3 falls in last 6 months (1 at home; 1 in community; and 1 in hospital).     Hand Dominance  Extremity/Trunk Assessment   Upper Extremity Assessment Upper Extremity Assessment: Generalized weakness (Deferred MMT d/t metastatic disease to bones)    Lower Extremity Assessment Lower Extremity Assessment: Generalized weakness (Deferred MMT d/t metastatic disease to bones; LE peripheral neuropathy)       Communication   Communication:  (Very soft spoken.)  Cognition Arousal/Alertness: Awake/alert Behavior During Therapy: Flat affect Overall  Cognitive Status: Within Functional Limits for tasks assessed                      General Comments General comments (skin integrity, edema, etc.): Pt's son and friend present during session.  Nursing cleared pt for participation in physical therapy.  Pt agreeable to PT session.  Pt's son agreeable to session but appearing hesitant about pt's ability to participate and not over-do it.    Exercises  Bed mobility and transfer training (increased time to perform with cueing for technique)   Assessment/Plan    PT Assessment Patient needs continued PT services  PT Problem List Decreased strength;Decreased activity tolerance;Decreased balance;Decreased mobility;Decreased knowledge of use of DME;Decreased knowledge of precautions       PT Treatment Interventions DME instruction;Gait training;Stair training;Functional mobility training;Therapeutic activities;Therapeutic exercise;Balance training;Patient/family education    PT Goals (Current goals can be found in the Care Plan section)  Acute Rehab PT Goals Patient Stated Goal: to get stronger PT Goal Formulation: With patient Time For Goal Achievement: 12/19/16 Potential to Achieve Goals: Fair    Frequency Min 2X/week   Barriers to discharge Decreased caregiver support      Co-evaluation               End of Session Equipment Utilized During Treatment: Gait belt Activity Tolerance: Patient limited by fatigue Patient left: in bed;with call bell/phone within reach;with bed alarm set;with family/visitor present Nurse Communication: Mobility status;Precautions PT Visit Diagnosis: Difficulty in walking, not elsewhere classified (R26.2);Muscle weakness (generalized) (M62.81);History of falling (Z91.81)         Time: 8346-2194 PT Time Calculation (min) (ACUTE ONLY): 30 min   Charges:   PT Evaluation $PT Eval Low Complexity: 1 Procedure PT Treatments $Therapeutic Activity: 8-22 mins   PT G CodesLeitha Bleak, PT 12/05/16, 2:54 PM [phone]

## 2016-12-05 NOTE — Progress Notes (Signed)
[facility] [place] NAME: Sarah Horne    MR#:  [id number]  DATE OF BIRTH:  1956/08/03  SUBJECTIVE:   Doing well. Legs feel better REVIEW OF SYSTEMS:   Review of Systems  Constitutional: Negative for chills, fever and weight loss.  HENT: Negative for ear discharge, ear pain and nosebleeds.   Eyes: Negative for blurred vision, pain and discharge.  Respiratory: Negative for sputum production, shortness of breath, wheezing and stridor.   Cardiovascular: Negative for chest pain, palpitations, orthopnea and PND.  Gastrointestinal: Negative for abdominal pain, diarrhea, nausea and vomiting.  Genitourinary: Negative for frequency and urgency.  Musculoskeletal: Negative for back pain and joint pain.  Neurological: Positive for weakness. Negative for sensory change, speech change and focal weakness.  Psychiatric/Behavioral: Negative for depression and hallucinations. The patient is not nervous/anxious.    Tolerating Diet:yesTolerating PT: pending  DRUG ALLERGIES:   Allergies  Allergen Reactions  . No Known Allergies     VITALS:  Blood pressure 116/78, pulse 93, temperature 97.9 F (36.6 C), temperature source Oral, resp. rate 20, height '5\' 6"'$  (1.676 m), weight 71.8 kg (158 lb 3.2 oz), SpO2 99 %.  PHYSICAL EXAMINATION:   Physical Exam  GENERAL:  61 y.o.-year-old patient lying in the bed with no acute distress.  EYES: Pupils equal, round, reactive to light and accommodation. No scleral icterus. Extraocular muscles intact.  HEENT: Head atraumatic, normocephalic. Oropharynx and nasopharynx clear.  NECK:  Supple, no jugular venous distention. No thyroid enlargement, no tenderness.  LUNGS: Normal breath sounds bilaterally, no wheezing, rales, rhonchi. No use of accessory muscles of respiration.  CARDIOVASCULAR: S1, S2 normal. No murmurs, rubs, or gallops.  ABDOMEN: Soft, nontender, nondistended. Bowel sounds present. No organomegaly  or mass.  EXTREMITIES: No cyanosis, clubbing or edema b/l.   Good pedal pulses. Legs feel warm NEUROLOGIC: Cranial nerves II through XII are intact. No focal Motor or sensory deficits b/l.   PSYCHIATRIC:  patient is alert and oriented x 3.  SKIN: No obvious rash, lesion, or ulcer.   LABORATORY PANEL:  CBC  Recent Labs Lab 12/05/16 0424  WBC 53.6*  HGB 12.7  HCT 38.9  PLT 68*    Chemistries   Recent Labs Lab 12/04/16 1629  NA 139  K 5.1  CL 105  CO2 28  GLUCOSE 140*  BUN 41*  CREATININE 0.62  CALCIUM 8.0*  AST 219*  ALT 474*  ALKPHOS 352*  BILITOT 2.1*   Cardiac Enzymes No results for input(s): TROPONINI in the last 168 hours. RADIOLOGY:  No results found. ASSESSMENT AND PLAN:   Sarah Horne  is a 61 y.o. female with a known history of Metastatic breast cancer was receiving chemotherapy at the cancer center was diagnosed with neuropathy bilateral lower extremities suspected due to cancer chemotherapy side effect comes to the emergency room from [place] after she was noted to have hold her feet during her visit of the cancer center. Patient did not have any palpable pulses in both lower extremities.  1. Bilateral lower extremity cyanosis and pallor with cold feet and only dopplerable pulses of present suspicion for peripheral arterial disease given history of tobacco abuse -IV heparin drip -on ASA---added Plavix. -pt has good strong palpable pulses now -Patient was seen by vascular surgery Dr. Lucky Cowboy. No vascular surgical intervention needed at this time  2. Metastatic breast cancer. Currently undergoing chemotherapy -Patient has history of peripheral neuropathy secondary side effects from chemotherapy  3. Tobacco  abuse counseled more than 4 minutes for smoking cessation  4. DVT prophylaxis already on IV heparin drip  5. Hyperlipidemia continue statins  6. Hypertension on Coreg  7. Leucocytosis appears reactive from po steroids (taking for solitary  brain mets) -no s/o infection -UA pending  PT to see  Case discussed with Care Management/Social Worker. Management plans discussed with the patient, family and they are in agreement.  CODE STATUS:full DVT Prophylaxis:on heparin gtt  TOTAL TIME TAKING CARE OF THIS PATIENT: 40 minutes.  >50% time spent on counselling and coordination of care  POSSIBLE D/C IN 1 DAYS, DEPENDING ON CLINICAL CONDITION.  Note: This dictation was prepared with Dragon dictation along with smaller phrase technology. Any transcriptional errors that result from this process are unintentional.  Sarah Horne M.D on 12/05/2016 at 7:33 AM  Between 7am to 6pm - Pager - [phone]  After 6pm go to [email or web address] - password [email or web address] [facility]  Office  [phone]  CC: Primary care physician; Keith Rake, MD

## 2016-12-05 NOTE — Progress Notes (Signed)
ANTICOAGULATION CONSULT NOTE - Follow Consult  Pharmacy Consult for heparin bolus and drip Indication: possible arterial leg clot  Allergies  Allergen Reactions  . No Known Allergies     Patient Measurements: Height: _0  (167.6 cm) Weight: 158 lb 3.2 oz (71.8 kg) (pt had several blanket on bed @ this time ) IBW/kg (Calculated) : 59.3 Heparin Dosing Weight: 66.2 kg  Vital Signs: Temp: 97.9 F (36.6 C) (03/10 1345) Temp Source: Oral (03/10 1345) BP: 102/66 (03/10 1345) Pulse Rate: 77 (03/10 1345)  Labs:  Recent Labs  12/04/16 1325 12/04/16 1629 12/04/16 2320 12/05/16 0424 12/05/16 0739 12/05/16 1453  HGB 14.4 14.3  --  12.7  --   --   HCT 42.0 42.9  --  38.9  --   --   PLT 66* 68* 65* 68*  --   --   APTT  --  24  --   --   --   --   LABPROT  --  13.5  --   --   --   --   INR  --  1.03  --   --   --   --   HEPARINUNFRC  --   --  1.12*  --  0.82* 0.41  CREATININE 0.61 0.62  --   --   --   --     Estimated Creatinine Clearance: 74 mL/min (by C-G formula based on SCr of 0.62 mg/dL).   Medical History: Past Medical History:  Diagnosis Date  . Breast cancer ([facility]) 2016   Left-radiation  . Cancer ([facility]) 2016   Stage II, ER positive, PR positive, HER-2/neu overexpressing of the left breast.  . Chemotherapy-induced peripheral neuropathy ([facility])   . Heart attack 1998  . Hyperlipidemia   . Hypertension   . Neuromuscular disorder ([facility])    neuropathy  . Neuropathy ([facility])     Medications:  Scheduled:  . anastrozole  1 mg Oral Daily  . aspirin EC  81 mg Oral Daily  . carvedilol  6.25 mg Oral BID  . clopidogrel  75 mg Oral Daily  . dexamethasone  4 mg Oral TID  . DULoxetine  60 mg Oral Daily  . nystatin  5 mL Oral QID  . potassium chloride SA  20 mEq Oral BID   Infusions:  . heparin 700 Units/hr (12/05/16 5300)    Assessment: Pharmacy consulted to dose heparin bolus and drip in this 67 yoF with possible arterial leg clot. Pt has history of metastatic breast  cancer to brain and bone presenting with worsening leg pain and numbness. Pt was at [facility] and was sent to ER for further evaluation for poor pulses. Per PTA med list patient is not taking oral anticoagulants. Rate is currently running at 700 u/hr Patient's platelets are 66, 65, 68. Will follow closely.  Goal of Therapy:  Heparin level 0.3-0.7 units/ml Monitor platelets by anticoagulation protocol: Yes   Plan:  3/10 1500 antiXa is therapeutic x 1 at 0.41. Will continue current rate of 700 u/hr and check level in 6 hours to confirm.  Darrow Bussing, PharmD Pharmacy Resident 12/05/2016 4:21 PM

## 2016-12-05 NOTE — Progress Notes (Signed)
ANTICOAGULATION CONSULT NOTE - Follow Consult  Pharmacy Consult for heparin bolus and drip Indication: possible arterial leg clot  Allergies  Allergen Reactions  . No Known Allergies     Patient Measurements: Height: _0  (167.6 cm) Weight: 158 lb 3.2 oz (71.8 kg) (pt had several blanket on bed @ this time ) IBW/kg (Calculated) : 59.3 Heparin Dosing Weight: 66.2 kg  Vital Signs: Temp: 97.9 F (36.6 C) (03/10 0532) Temp Source: Oral (03/10 0532) BP: 116/78 (03/10 0532) Pulse Rate: 93 (03/10 0532)  Labs:  Recent Labs  12/04/16 1325 12/04/16 1629 12/04/16 2320 12/05/16 0424 12/05/16 0739  HGB 14.4 14.3  --  12.7  --   HCT 42.0 42.9  --  38.9  --   PLT 66* 68* 65* 68*  --   APTT  --  24  --   --   --   LABPROT  --  13.5  --   --   --   INR  --  1.03  --   --   --   HEPARINUNFRC  --   --  1.12*  --  0.82*  CREATININE 0.61 0.62  --   --   --     Estimated Creatinine Clearance: 74 mL/min (by C-G formula based on SCr of 0.62 mg/dL).   Medical History: Past Medical History:  Diagnosis Date  . Breast cancer ([facility]) 2016   Left-radiation  . Cancer ([facility]) 2016   Stage II, ER positive, PR positive, HER-2/neu overexpressing of the left breast.  . Chemotherapy-induced peripheral neuropathy ([place])   . Heart attack 1998  . Hyperlipidemia   . Hypertension   . Neuromuscular disorder ([facility])    neuropathy  . Neuropathy ([facility])     Medications:  Scheduled:  . anastrozole  1 mg Oral Daily  . aspirin EC  81 mg Oral Daily  . carvedilol  6.25 mg Oral BID  . clopidogrel  75 mg Oral Daily  . dexamethasone  4 mg Oral TID  . DULoxetine  60 mg Oral Daily  . nystatin  5 mL Oral QID  . potassium chloride SA  20 mEq Oral BID   Infusions:  . heparin 850 Units/hr (12/05/16 0133)    Assessment: Pharmacy consulted to dose heparin bolus and drip in this 14 yoF with possible arterial leg clot. Pt has history of metastatic breast cancer to brain and bone presenting with worsening leg  pain and numbness. Pt was at [facility] and was sent to ER for further evaluation for poor pulses. Per PTA med list patient is not taking oral anticoagulants. Baseline labs ordered. Patient's platelets are 66 today. Will follow closely.  Goal of Therapy:  Heparin level 0.3-0.7 units/ml Monitor platelets by anticoagulation protocol: Yes   Plan:  Give 3500 units bolus x 1 Start heparin infusion at 1100 units/hr Check anti-Xa level in 6 hours and daily while on heparin Continue to monitor H&H and platelets   3/9 23:30 heparin level 1.12, Hold infusion x 1 hour and restart at 850 units/hr. Recheck heparin level 6 hours after restart..  3/10: Heparin level resulted @ 0.82. Will decrease heparin gtt by 150 units. Heparin gtt rate of 700 units/hr ordered. Will recheck heparin level @ 15:00.   Larene Beach, PharmD  12/05/2016 8:38 AM

## 2016-12-06 ENCOUNTER — Other Ambulatory Visit: Payer: Self-pay | Admitting: Oncology

## 2016-12-06 LAB — URINALYSIS, COMPLETE (UACMP) WITH MICROSCOPIC
Bilirubin Urine: NEGATIVE
GLUCOSE, UA: NEGATIVE mg/dL
KETONES UR: NEGATIVE mg/dL
Nitrite: NEGATIVE
PH: 5 (ref 5.0–8.0)
Protein, ur: NEGATIVE mg/dL
Specific Gravity, Urine: 1.03 (ref 1.005–1.030)

## 2016-12-06 LAB — CBC
HCT: 35.8 % (ref 35.0–47.0)
HEMOGLOBIN: 11.8 g/dL — AB (ref 12.0–16.0)
MCH: 30.4 pg (ref 26.0–34.0)
MCHC: 32.9 g/dL (ref 32.0–36.0)
MCV: 92.5 fL (ref 80.0–100.0)
Platelets: 73 10*3/uL — ABNORMAL LOW (ref 150–440)
RBC: 3.87 MIL/uL (ref 3.80–5.20)
RDW: 16 % — ABNORMAL HIGH (ref 11.5–14.5)
WBC: 58.6 10*3/uL (ref 3.6–11.0)

## 2016-12-06 MED ORDER — ENSURE ENLIVE PO LIQD
237.0000 mL | Freq: Two times a day (BID) | ORAL | Status: DC
  Administered 2016-12-06 – 2016-12-11 (×4): 237 mL via ORAL

## 2016-12-06 NOTE — Progress Notes (Signed)
[facility] [place] NAME: Sarah Horne    MR#:  [id number]  DATE OF BIRTH:  03-20-1956  SUBJECTIVE:   Doing well. Legs feel better. Weakness + REVIEW OF SYSTEMS:   Review of Systems  Constitutional: Negative for chills, fever and weight loss.  HENT: Negative for ear discharge, ear pain and nosebleeds.   Eyes: Negative for blurred vision, pain and discharge.  Respiratory: Negative for sputum production, shortness of breath, wheezing and stridor.   Cardiovascular: Negative for chest pain, palpitations, orthopnea and PND.  Gastrointestinal: Negative for abdominal pain, diarrhea, nausea and vomiting.  Genitourinary: Negative for frequency and urgency.  Musculoskeletal: Negative for back pain and joint pain.  Neurological: Positive for weakness. Negative for sensory change, speech change and focal weakness.  Psychiatric/Behavioral: Negative for depression and hallucinations. The patient is not nervous/anxious.    Tolerating Diet:yesTolerating PT: STR  DRUG ALLERGIES:   Allergies  Allergen Reactions  . No Known Allergies     VITALS:  Blood pressure 109/70, pulse 82, temperature 98.2 F (36.8 C), temperature source Oral, resp. rate 19, height '5\' 6"'$  (1.676 m), weight 71.8 kg (158 lb 3.2 oz), SpO2 100 %.  PHYSICAL EXAMINATION:   Physical Exam  GENERAL:  61 y.o.-year-old patient lying in the bed with no acute distress.  EYES: Pupils equal, round, reactive to light and accommodation. No scleral icterus. Extraocular muscles intact.  HEENT: Head atraumatic, normocephalic. Oropharynx and nasopharynx clear.  NECK:  Supple, no jugular venous distention. No thyroid enlargement, no tenderness.  LUNGS: Normal breath sounds bilaterally, no wheezing, rales, rhonchi. No use of accessory muscles of respiration.  CARDIOVASCULAR: S1, S2 normal. No murmurs, rubs, or gallops.  ABDOMEN: Soft, nontender, nondistended. Bowel sounds present. No  organomegaly or mass.  EXTREMITIES: No cyanosis, clubbing or edema b/l.   Good pedal pulses. Legs feel warm NEUROLOGIC: Cranial nerves II through XII are intact. No focal Motor or sensory deficits b/l.   PSYCHIATRIC:  patient is alert and oriented x 3.  SKIN: No obvious rash, lesion, or ulcer.   LABORATORY PANEL:  CBC  Recent Labs Lab 12/06/16 0823  WBC 58.6*  HGB 11.8*  HCT 35.8  PLT 73*    Chemistries   Recent Labs Lab 12/04/16 1629  NA 139  K 5.1  CL 105  CO2 28  GLUCOSE 140*  BUN 41*  CREATININE 0.62  CALCIUM 8.0*  AST 219*  ALT 474*  ALKPHOS 352*  BILITOT 2.1*   Cardiac Enzymes No results for input(s): TROPONINI in the last 168 hours. RADIOLOGY:  Ct Angio Aortobifemoral W And/or Wo Contrast  Result Date: 12/05/2016 CLINICAL DATA:  Pt has history of metastatic breast cancer to brain and bone presenting with worsening bitlateral leg pain and numbness. Pt was at [facility] and was sent to ER for further evaluation for poor pulses. Pt lower legs cold to touch bilaterally. Is clip EXAM: CT ANGIOGRAPHY OF ABDOMINAL AORTA WITH ILIOFEMORAL RUNOFF TECHNIQUE: Multidetector CT imaging of the abdomen, pelvis and lower extremities was performed using the standard protocol during bolus administration of intravenous contrast. Multiplanar CT image reconstructions and MIPs were obtained to evaluate the vascular anatomy. CONTRAST:  125 mL Isovue 370 IV COMPARISON:  11/02/2016 FINDINGS: VASCULAR Aorta: Mild atheromatous plaque. No aneurysm, dissection, or stenosis. Celiac:Partially calcified ostial plaque resulting in short segment stenosis of at least mild severity, patent distally. SMA: Patent without evidence of aneurysm, dissection, vasculitis or significant stenosis. Renals: Both renal arteries are  patent without evidence of aneurysm, dissection, vasculitis, fibromuscular dysplasia or significant stenosis. IMA: Diminutive, patent RIGHT Lower Extremity Inflow: Minimal plaque at the  right common iliac origin without high-grade stenosis, patent distally Outflow: Common, superficial and profunda femoral arteries and the popliteal artery are patent without evidence of aneurysm, dissection, vasculitis or significant stenosis. Runoff: Posterior tibial non-opacified in the proximal calf. Peroneal non-opacified mid calf. Anterior tibial artery non-opacified below distal calf. No collateral reconstitution of distal named vessels although this may be simply secondary to early scan timing. LEFT Lower Extremity Inflow: Nonocclusive plaque at the origin of the left common iliac and its bifurcation. Otherwise patent. Outflow: Common, superficial and profunda femoral arteries and the popliteal artery are patent without evidence of aneurysm, dissection, vasculitis or significant stenosis. Runoff: Posterior tibial artery not seen below mid calf. Peroneal artery send a distal calf. Anterior tibial artery is non-opacified above the ankle. Veins: Dedicated venous phase imaging not obtained. Review of the MIP images confirms the above findings. NON-VASCULAR Lower chest: No acute abnormality. Hepatobiliary: Innumerable low-attenuation lesions throughout the liver consistent metastatic disease as noted on previous scan, without convincing interval change. Gallbladder nondilated. No definite biliary ductal dilatation identified. Pancreas: Unremarkable. No pancreatic ductal dilatation or surrounding inflammatory changes. Spleen: Normal in size without focal abnormality. Adrenals/Urinary Tract: Adrenal glands are unremarkable. Kidneys are normal, without renal calculi, focal lesion, or hydronephrosis. Bladder is unremarkable. Stomach/Bowel: Stomach is within normal limits. Appendix not discretely identified. No evidence of bowel wall thickening, distention, or inflammatory changes. Lymphatic: No adenopathy identified Reproductive: Coarse calcifications in degenerated uterine fibroids. No adnexal mass. Other: No  ascites.  No free air. Musculoskeletal: Sclerotic T12 vertebral body lesion stable since most recent previous scan, new since 01/04/2015. Sclerotic inferior right pubic ramus lesion stable. Mild degenerative change in bilateral hips. No fracture or additional worrisome bone lesion. IMPRESSION: VASCULAR 1. No significant aortoiliac or femoro-popliteal arterial occlusive disease bilaterally. 2. Incomplete opacification of distal tibial arterial runoff bilaterally. Suspect this is secondary to early scan timing given the lack of any opacification of collateral channels. Acute embolic disease could have a similar appearance. Atheromatous obstructive disease is considered less likely. NON-VASCULAR 1. No acute findings. 2. Liver and bone metastases as previously noted. Electronically Signed   By: Lucrezia Europe M.D.   On: 12/05/2016 08:47   ASSESSMENT AND PLAN:   Sarah Horne  is a 61 y.o. female with a known history of Metastatic breast cancer was receiving chemotherapy at the cancer center was diagnosed with neuropathy bilateral lower extremities suspected due to cancer chemotherapy side effect comes to the emergency room from [place] after she was noted to have hold her feet during her visit of the cancer center. Patient did not have any palpable pulses in both lower extremities.  1. Bilateral lower extremity cyanosis and pallor with cold feet and only dopplerable pulses of present suspicion for peripheral arterial disease given history of tobacco abuse -IV heparin drip d/ced now -on ASA---added Plavix. -pt has good strong palpable pulses now -Patient was seen by vascular surgery Dr. Lucky Cowboy. No vascular surgical intervention needed at this time  2. Metastatic breast cancer. Currently undergoing chemotherapy -Patient has history of peripheral neuropathy secondary side effects from chemotherapy  3. Tobacco abuse counseled more than 4 minutes for smoking cessation  4. DVT prophylaxis already on IV  heparin drip  5. Hyperlipidemia continue statins  6. Hypertension on Coreg  7. Leucocytosis appears reactive from po steroids (taking for solitary brain mets) -no s/o  infection -UA pending  PT recommends HHPT  Case discussed with Care Management/Social Worker. Management plans discussed with the patient, family and they are in agreement.  CODE STATUS:full DVT Prophylaxis:on heparin gtt  TOTAL TIME TAKING CARE OF THIS PATIENT: 40 minutes.  >50% time spent on counselling and coordination of care  POSSIBLE D/C IN 1 DAYS, DEPENDING ON CLINICAL CONDITION.  Note: This dictation was prepared with Dragon dictation along with smaller phrase technology. Any transcriptional errors that result from this process are unintentional.  Makana Feigel M.D on 12/06/2016 at 2:28 PM  Between 7am to 6pm - Pager - [phone]  After 6pm go to [email or web address] - password [email or web address] [facility]  Office  [phone]  CC: Primary care physician; Keith Rake, MD

## 2016-12-06 NOTE — Plan of Care (Signed)
Problem: Physical Regulation: Goal: Ability to maintain clinical measurements within normal limits will improve Outcome: Not Progressing Continues to have an elevated WBCs. Pending Urine analysis.  Problem: Activity: Goal: Risk for activity intolerance will decrease Outcome: Not Progressing Working with PT. Advised patient to ask for assistance when ambulating and toileting.

## 2016-12-06 NOTE — Clinical Social Work Note (Signed)
CSW visited the patient to provide bed offers. The patient has chosen [facility], and she reports that she will need EMS at dc. CSW will con't to follow for dc planning.  Santiago Bumpers, MSW, Latanya Presser [phone]

## 2016-12-06 NOTE — Plan of Care (Signed)
Problem: Education: Goal: Knowledge of [place] [facility] Education information/materials will improve Outcome: Progressing Drinking more liquids and trying to eat  A little more.  Chair.   Problem: Skin Integrity: Goal: Risk for impaired skin integrity will decrease Outcome: Progressing Will make sure lab  Knows pt has a port to draw blood through to prevent  peripherial sticks  Problem: Activity: Goal: Risk for activity intolerance will decrease Outcome: Progressing chair  Problem: Nutrition: Goal: Adequate nutrition will be maintained Outcome: Progressing See above note

## 2016-12-06 NOTE — Progress Notes (Signed)
Initial Nutrition Assessment  DOCUMENTATION CODES:   Not applicable  INTERVENTION:  1. Ensure Enlive po BID, each supplement provides 350 kcal and 20 grams of protein  NUTRITION DIAGNOSIS:   Inadequate oral intake related to cancer and cancer related treatments, poor appetite as evidenced by per patient/family report.  GOAL:   Patient will meet greater than or equal to 90% of their needs  MONITOR:   PO intake, I & O's, Labs, Weight trends, Supplement acceptance  REASON FOR ASSESSMENT:   Malnutrition Screening Tool    ASSESSMENT:   Madyson Lukach  is a 61 y.o. female with a known history of Metastatic breast cancer was receiving chemotherapy at the cancer center was diagnosed with neuropathy bilateral lower extremities suspected due to cancer chemotherapy side effect comes to the emergency room from [place] after she was noted to have hold her feet during her visit of the cancer center  Spoke with Ms. Larrabee at bedside - she was tired, lethargic, did not want to talk. She denies any significant wt loss - "I lost a pound or two." Per chart, exhibits an 8#/5% insignificant wt loss over 4 months. Denies any nausea/vomting/pain Reports everything tastes like cotton due to chemo - currently eating things that taste good to her "fish." She is also consuming ensure. She did not provide many more details. Nutrition-Focused physical exam completed. Findings are no fat depletion, no muscle depletion, and no edema.  Did not eat today or yesterday evening. Labs and medications reviewed: Elevated LFTs Decadron, KCL  Diet Order:  Diet Heart Room service appropriate? Yes; Fluid consistency: Thin  Skin:  Reviewed, no issues  Last BM:  PTA  Height:   Ht Readings from Last 1 Encounters:  12/04/16 '5\' 6"'$  (1.676 m)    Weight:   Wt Readings from Last 1 Encounters:  12/04/16 158 lb 3.2 oz (71.8 kg)    Ideal Body Weight:  59.09 kg  BMI:  Body mass index is 25.53  kg/m.  Estimated Nutritional Needs:   Kcal:  2100-2500 calories  Protein:  86-108 gm  Fluid:  >/= 2.1L  EDUCATION NEEDS:   No education needs identified at this time  Satira Anis. Kaimani Clayson, MS, RD LDN Inpatient Clinical Dietitian Pager [phone]

## 2016-12-07 ENCOUNTER — Inpatient Hospital Stay: Payer: BLUE CROSS/BLUE SHIELD

## 2016-12-07 ENCOUNTER — Encounter: Payer: Self-pay | Admitting: Radiology

## 2016-12-07 ENCOUNTER — Ambulatory Visit: Payer: BLUE CROSS/BLUE SHIELD | Admitting: Cardiovascular Disease

## 2016-12-07 DIAGNOSIS — Z923 Personal history of irradiation: Secondary | ICD-10-CM

## 2016-12-07 DIAGNOSIS — C50919 Malignant neoplasm of unspecified site of unspecified female breast: Secondary | ICD-10-CM

## 2016-12-07 DIAGNOSIS — R571 Hypovolemic shock: Secondary | ICD-10-CM

## 2016-12-07 DIAGNOSIS — I998 Other disorder of circulatory system: Secondary | ICD-10-CM

## 2016-12-07 DIAGNOSIS — D649 Anemia, unspecified: Secondary | ICD-10-CM

## 2016-12-07 DIAGNOSIS — Z7901 Long term (current) use of anticoagulants: Secondary | ICD-10-CM

## 2016-12-07 DIAGNOSIS — D72829 Elevated white blood cell count, unspecified: Secondary | ICD-10-CM

## 2016-12-07 DIAGNOSIS — K625 Hemorrhage of anus and rectum: Secondary | ICD-10-CM

## 2016-12-07 DIAGNOSIS — C7931 Secondary malignant neoplasm of brain: Secondary | ICD-10-CM

## 2016-12-07 DIAGNOSIS — C787 Secondary malignant neoplasm of liver and intrahepatic bile duct: Secondary | ICD-10-CM

## 2016-12-07 DIAGNOSIS — I959 Hypotension, unspecified: Secondary | ICD-10-CM

## 2016-12-07 LAB — CBC
HCT: 29.6 % — ABNORMAL LOW (ref 35.0–47.0)
HEMOGLOBIN: 9.9 g/dL — AB (ref 12.0–16.0)
MCH: 30.9 pg (ref 26.0–34.0)
MCHC: 33.6 g/dL (ref 32.0–36.0)
MCV: 91.9 fL (ref 80.0–100.0)
Platelets: 86 10*3/uL — ABNORMAL LOW (ref 150–440)
RBC: 3.22 MIL/uL — AB (ref 3.80–5.20)
RDW: 15.7 % — ABNORMAL HIGH (ref 11.5–14.5)
WBC: 52.2 10*3/uL — AB (ref 3.6–11.0)

## 2016-12-07 LAB — PREPARE RBC (CROSSMATCH)

## 2016-12-07 LAB — PROTIME-INR
INR: 1.51
Prothrombin Time: 18.4 seconds — ABNORMAL HIGH (ref 11.4–15.2)

## 2016-12-07 LAB — GLUCOSE, CAPILLARY: GLUCOSE-CAPILLARY: 142 mg/dL — AB (ref 65–99)

## 2016-12-07 LAB — HEMOGLOBIN: Hemoglobin: 11.2 g/dL — ABNORMAL LOW (ref 12.0–16.0)

## 2016-12-07 LAB — MRSA PCR SCREENING: MRSA BY PCR: NEGATIVE

## 2016-12-07 MED ORDER — SODIUM CHLORIDE 0.9 % IV SOLN
250.0000 mL | Freq: Once | INTRAVENOUS | Status: AC
  Administered 2016-12-07: 250 mL via INTRAVENOUS

## 2016-12-07 MED ORDER — POLYETHYLENE GLYCOL 3350 17 G PO PACK
17.0000 g | PACK | Freq: Every day | ORAL | Status: DC
  Administered 2016-12-11: 10:00:00 17 g via ORAL
  Filled 2016-12-07: qty 1

## 2016-12-07 MED ORDER — SODIUM CHLORIDE 0.9 % IV SOLN
80.0000 mg | Freq: Once | INTRAVENOUS | Status: AC
  Administered 2016-12-08: 80 mg via INTRAVENOUS
  Filled 2016-12-07: qty 80

## 2016-12-07 MED ORDER — SODIUM CHLORIDE 0.9 % IV SOLN
INTRAVENOUS | Status: DC
  Administered 2016-12-07 (×2): via INTRAVENOUS
  Administered 2016-12-08: 1000 mL via INTRAVENOUS
  Administered 2016-12-08: 12:00:00 via INTRAVENOUS

## 2016-12-07 MED ORDER — SODIUM CHLORIDE 0.9 % IV SOLN
8.0000 mg/h | INTRAVENOUS | Status: DC
  Administered 2016-12-08: 8 mg/h via INTRAVENOUS
  Filled 2016-12-07 (×2): qty 80

## 2016-12-07 MED ORDER — SODIUM CHLORIDE 0.9% FLUSH: 10.0000 mL | INTRAVENOUS | Status: DC | PRN

## 2016-12-07 MED ORDER — NOREPINEPHRINE BITARTRATE 1 MG/ML IV SOLN
0.0000 ug/min | INTRAVENOUS | Status: DC
  Filled 2016-12-07: qty 4

## 2016-12-07 MED ORDER — PANTOPRAZOLE SODIUM 40 MG IV SOLR: 40.0000 mg | Freq: Two times a day (BID) | INTRAVENOUS | Status: DC

## 2016-12-07 MED ORDER — TECHNETIUM TC 99M-LABELED RED BLOOD CELLS IV KIT
21.7330 | PACK | Freq: Once | INTRAVENOUS | Status: AC | PRN
  Administered 2016-12-07: 21.733 via INTRAVENOUS

## 2016-12-07 MED ORDER — HEPARIN SOD (PORK) LOCK FLUSH 100 UNIT/ML IV SOLN
500.0000 [IU] | Freq: Every day | INTRAVENOUS | Status: DC | PRN
  Filled 2016-12-07 (×2): qty 5

## 2016-12-07 MED ORDER — SODIUM CHLORIDE 0.9 % IV SOLN
Freq: Once | INTRAVENOUS | Status: AC
  Administered 2016-12-07: 11:00:00 via INTRAVENOUS

## 2016-12-07 MED ORDER — CLOPIDOGREL BISULFATE 75 MG PO TABS: 75.0000 mg | ORAL_TABLET | Freq: Every day | ORAL | 1 refills | Status: DC

## 2016-12-07 MED ORDER — SODIUM CHLORIDE 0.9% FLUSH: 3.0000 mL | INTRAVENOUS | Status: DC | PRN

## 2016-12-07 MED ORDER — ENSURE ENLIVE PO LIQD: 237.0000 mL | Freq: Two times a day (BID) | ORAL | 12 refills | Status: DC

## 2016-12-07 MED ORDER — SENNOSIDES-DOCUSATE SODIUM 8.6-50 MG PO TABS: 1.0000 | ORAL_TABLET | Freq: Every evening | ORAL | 0 refills | Status: DC | PRN

## 2016-12-07 MED ORDER — POLYETHYLENE GLYCOL 3350 17 G PO PACK: 17.0000 g | PACK | Freq: Every day | ORAL | 0 refills | Status: DC

## 2016-12-07 MED ORDER — HEPARIN SOD (PORK) LOCK FLUSH 100 UNIT/ML IV SOLN
250.0000 [IU] | INTRAVENOUS | Status: DC | PRN
  Filled 2016-12-07: qty 3

## 2016-12-07 MED ORDER — MORPHINE SULFATE (PF) 4 MG/ML IV SOLN
2.0000 mg | INTRAVENOUS | Status: DC | PRN
Start: 2016-12-07 — End: 2016-12-07

## 2016-12-07 NOTE — Consult Note (Signed)
PULMONARY / CRITICAL CARE MEDICINE   Name: Sarah Horne MRN: [id number] DOB: 1956-08-24    ADMISSION DATE:  12/04/2016 CONSULTATION DATE:  12/07/16  REFERRING MD : sona patel  CHIEF COMPLAINT: low BP, Gi Bleed  INITIAL PRESENTATION:admitted for lower ext ischemia, started on bloodthinners, developed acute GIB    HISTORY OF PRESENT ILLNESS:    61 y.o. female with a known history of Metastatic breast cancer was receiving chemotherapy at the cancer center was diagnosed with neuropathy bilateral lower extremities suspected due to cancer chemotherapy side effect  -seen in ER for cold feet/extremities -Patient did not have any palpable pulses in both lower extremities. She underwent CT angiogram of lower extremity which which showed reasonably normal flow to the Tibial vessels below this not much contrast is seen and per Dr. Lucky Cowboy daily) is very poor visualization tibial vessels even with good perfusion.  Given history of smoking heavily for long time and it presenting symptoms patient was started on IV heparin drip - and antiplatelet agents   Hosp course complicated by acute loss drop in BP and acute GIB Patient was going to obtain [place] Med scan but had acute hypotensive crisis and was transferred to ICU. She is now lethargic and unable to perform ROS  Patient now is critically ill   3/9 admitted for presumed lower ext ischemia started on heparin 3/12 developed acute GIB/low BP, ICU admission  PAST MEDICAL HISTORY :   has a past medical history of Breast cancer ([place]) (2016); Cancer ([place]) (2016); Chemotherapy-induced peripheral neuropathy ([place]); Heart attack (1998); Hyperlipidemia; Hypertension; Neuromuscular disorder ([place]); and Neuropathy ([place]).  has a past surgical history that includes Portacath placement (Right, 12-31-14); Breast lumpectomy with sentinel lymph node bx (Left, 05/23/2015); Breast surgery (Left, 12/18/14); Breast surgery (05/23/2015.); Cardiac catheterization; and Breast  biopsy (Left, 2016). Prior to Admission medications   Medication Sig Start Date End Date Taking? Authorizing Provider  anastrozole (ARIMIDEX) 1 MG tablet TAKE 1 TABLET DAILY 11/04/16  Yes Cammie Sickle, MD  aspirin 81 MG tablet Take 81 mg by mouth daily.   Yes Historical Provider, MD  carvedilol (COREG) 6.25 MG tablet Take 1 tablet (6.25 mg total) by mouth 2 (two) times daily. 05/07/16  Yes [name], MD  dexamethasone (DECADRON) 4 MG tablet Take 1 tablet (4 mg total) by mouth 3 (three) times daily. 11/22/16  Yes Lequita Asal, MD  diphenoxylate-atropine (LOMOTIL) 2.5-0.025 MG tablet Take 1 tablet by mouth 4 (four) times daily as needed for diarrhea or loose stools. Take it along with immodium 09/07/16  Yes Cammie Sickle, MD  DULoxetine (CYMBALTA) 60 MG capsule TAKE 1 CAPSULE (60 MG TOTAL) BY MOUTH DAILY. 11/24/16  Yes Cammie Sickle, MD  nystatin (MYCOSTATIN) 100000 UNIT/ML suspension Use as directed 5 mLs in the mouth or throat 4 (four) times daily.  12/02/16  Yes Historical Provider, MD  ondansetron (ZOFRAN) 8 MG tablet Take 1 tablet (8 mg total) by mouth every 8 (eight) hours as needed for nausea or vomiting (start 3 days; after chemo). 11/26/16  Yes Cammie Sickle, MD  potassium chloride SA (K-DUR,KLOR-CON) 20 MEQ tablet 1 pill twice a day 10/28/16  Yes Cammie Sickle, MD  prochlorperazine (COMPAZINE) 10 MG tablet Take 1 tablet (10 mg total) by mouth every 6 (six) hours as needed for nausea or vomiting. 11/26/16  Yes Cammie Sickle, MD  clopidogrel (PLAVIX) 75 MG tablet Take 1 tablet (75 mg total) by mouth daily. 12/07/16  Fritzi Mandes, MD  feeding supplement, ENSURE ENLIVE, (ENSURE ENLIVE) LIQD Take 237 mLs by mouth 2 (two) times daily between meals. 12/07/16   Fritzi Mandes, MD  polyethylene glycol (MIRALAX / GLYCOLAX) packet Take 17 g by mouth daily. 12/07/16   Fritzi Mandes, MD  senna-docusate (SENOKOT-S) 8.6-50 MG tablet Take 1 tablet by mouth at bedtime as  needed for mild constipation. 12/07/16   Fritzi Mandes, MD   Allergies  Allergen Reactions  . No Known Allergies     FAMILY HISTORY:  indicated that the status of her maternal aunt is unknown. She indicated that the status of her maternal uncle is unknown. She indicated that the status of her cousin is unknown.   SOCIAL HISTORY:  reports that she has been smoking Cigarettes.  She has a 9.00 pack-year smoking history. She has never used smokeless tobacco. She reports that she does not drink alcohol or use drugs.   VITAL SIGNS: Temp:  [97.9 F (36.6 C)-98.5 F (36.9 C)] 97.9 F (36.6 C) (03/12 1300) Pulse Rate:  [78-117] 106 (03/12 1300) Resp:  [19-24] 23 (03/12 1300) BP: (72-126)/(46-86) 73/58 (03/12 1300) SpO2:  [99 %-100 %] 100 % (03/12 1100) Weight:  [154 lb 8.7 oz (70.1 kg)] 154 lb 8.7 oz (70.1 kg) (03/12 1100) HEMODYNAMICS:     INTAKE / OUTPUT:  Intake/Output Summary (Last 24 hours) at 12/07/16 1310 Last data filed at 12/07/16 1045  Gross per 24 hour  Intake           918.67 ml  Output                0 ml  Net           918.67 ml    PHYSICAL EXAMINATION:  GENERAL:critically ill appearing, -resp distress HEAD: Normocephalic, atraumatic.  EYES: Pupils equal, round, reactive to light.  No scleral icterus.  MOUTH: Moist mucosal membrane. NECK: Supple. No thyromegaly. No nodules. No JVD.  PULMONARY: CTA b/L CARDIOVASCULAR: S1 and S2. Regular rate and rhythm. No murmurs, rubs, or gallops.  GASTROINTESTINAL: Soft, nontender, -distended. No masses. Positive bowel sounds. No hepatosplenomegaly.  MUSCULOSKELETAL: No swelling, clubbing, or edema.  NEUROLOGIC: lethargic but arousable SKIN:intact,warm,dry   LABS:  CBC  Recent Labs Lab 12/05/16 0424 12/06/16 0823 12/07/16 0729  WBC 53.6* 58.6* 52.2*  HGB 12.7 11.8* 9.9*  HCT 38.9 35.8 29.6*  PLT 68* 73* 86*   Coag's  Recent Labs Lab 12/04/16 1629  APTT 24  INR 1.03   BMET  Recent Labs Lab 12/04/16 1325  12/04/16 1629  NA 134* 139  K 5.1 5.1  CL 101 105  CO2 27 28  BUN 43* 41*  CREATININE 0.61 0.62  GLUCOSE 149* 140*   Electrolytes  Recent Labs Lab 12/04/16 1325 12/04/16 1629  CALCIUM 8.1* 8.0*   Sepsis Markers No results for input(s): LATICACIDVEN, PROCALCITON, O2SATVEN in the last 168 hours. ABG No results for input(s): PHART, PCO2ART, PO2ART in the last 168 hours. Liver Enzymes  Recent Labs Lab 12/04/16 1325 12/04/16 1629  AST 218* 219*  ALT 463* 474*  ALKPHOS 373* 352*  BILITOT 2.2* 2.1*  ALBUMIN 2.9* 2.9*   Cardiac Enzymes No results for input(s): TROPONINI, PROBNP in the last 168 hours. Glucose No results for input(s): GLUCAP in the last 168 hours.  Imaging Dg Abd 1 View  Result Date: 12/07/2016 CLINICAL DATA:  Status post NG tube placement. EXAM: ABDOMEN - 1 VIEW COMPARISON:  CT scan of the abdomen and pelvis of December 04, 2016 FINDINGS: The tip of the nasogastric tube lies in the proximal gastric body. The proximal port lies at or just below the level of the GE junction. IMPRESSION: Advancement of the nasogastric tube by 5-10 cm would be useful to assure that the proximal port remains below the GE junction. Electronically Signed   By: David  Martinique M.D.   On: 12/07/2016 11:18   Dg Abd Portable 1v  Result Date: 12/07/2016 CLINICAL DATA:  NG tube placement. EXAM: PORTABLE ABDOMEN - 1 VIEW 11:57 a.m. COMPARISON:  12/07/2016 at 10:56 a.m. FINDINGS: NG tube has been advanced and the tip is now in the distal body of the stomach. The bowel gas pattern is normal.  No visible free air or free fluid. IMPRESSION: NG tube tip is in the distal body of the stomach. Electronically Signed   By: Lorriane Shire M.D.   On: 12/07/2016 12:23     ASSESSMENT / PLAN:  61 yo AAF with acute and severe hypovolumic shock from acute GIB ?etiologgy /source with underlying lung cancer with mets with encephalopathy   PULMONARY Oxygen as needed  CARDIOVASCULAR ICU monitoring -will  re-assess CVL placement, patient has RT chest port, left sided s/p mastectomy Only other option would be femoral access but willuse peripheral neo thorough chest port once blood [place] given  RENAL Watch UOP  GASTROINTESTINAL GIB-awaiting nuc med scan for vascular surgical evaluation Will need to obtain ASAP   HEMATOLOGIC Transfuse to keep HGB>7  INFECTIOUS No abx needed at this time No signs of infection at this time  NEUROLOGIC Lethargic due to shock    Critical Care Time devoted to patient care services described in this note is 45 minutes.   Overall, patient is critically ill, prognosis is guarded.   high risk for cardiac arrest and death.  Plan: infuse PRBC's, give NS, will use peripheral NEO if needed until  nuc med scan is completed, then will assess for femoral vein CVL afterwards  Corrin Parker, M.D.  [facility]  Medical Director [place] Director [facility] Cardio-Pulmonary Department

## 2016-12-07 NOTE — Addendum Note (Signed)
Encounter addended by: Vernie Murders, RT on: 12/07/2016  9:37 AM<BR>    Actions taken: Imaging Exam ended, Charge Capture section accepted

## 2016-12-07 NOTE — Progress Notes (Signed)
   Pt's bp now 65/50 with MAP 58. shie is unstable to get NM bleeding scan done. Will start on IV levophed gtt and give 2 untis BT stat. Spoke with Dr Mortimer Fries to see pt As soon as BP stable will get  Bleeding scan done Pt is critically ill. Spoke with brother Marlou Sa at bedside. He understands and told me that he has informed pt's son. Will check h/h q 6 hourly after 2 untis BT  Pt earlier did not want me to call her son.  Critically ill Time 30 mins

## 2016-12-07 NOTE — Progress Notes (Signed)
This nurse spoke with blood Bank and verified that the they  currently has 2 units of PRBC on hold if needed.

## 2016-12-07 NOTE — Progress Notes (Signed)
Pt had approx 200 ml dark maroon liquid stool. MD aware, repeat hgb pending, will start protonix drip

## 2016-12-07 NOTE — Care Management (Signed)
MD paged to cancel discharge order as patient has transferred to ICU.

## 2016-12-07 NOTE — Progress Notes (Signed)
NG tube placed, lavaged per MD Wohl. No output from lavage, MD Allen Norris made aware. MD Brunetta Jeans stated to remove NG tube. Will continue to monitor patient.

## 2016-12-07 NOTE — Progress Notes (Signed)
At around 0630, patient assisted to the [facility], had a moderate stool noticeably bloody and mucousy with some clots present.  Notified MD on call through page. Patient is comfortable, not in any form of distress.

## 2016-12-07 NOTE — Progress Notes (Signed)
PT Cancellation Note  Patient Details Name: Sarah Horne MRN: [id number] DOB: [birth year]   Cancelled Treatment:    Reason Eval/Treat Not Completed: Medical issues which prohibited therapy (Patient transferred to CCU due to decline in medical stats. Per policy, will require new orders to resume PT.  Please re-consult as medically appropriate.)   Keston Seever H. Owens Shark, PT, DPT, NCS 12/07/16, 9:48 PM [phone]

## 2016-12-07 NOTE — Progress Notes (Signed)
Called by RN that rapid response was called while pt was in Nuclear medicine. Pt became hypotensive with bp in the 50's Large rectal bleed x 3 since 7 am -transfer to CCU stat Pt awake and appears weak -wide open IVF -use port for IV access -spoke with dr schnier who will d/w dr dew Dr Allen Norris saw pt and recommends bleeding scan which is not feasible since pt unstbale. Consider Angiogram if able. Awaiting to speak with Dr dew  2 units BT ordered stat hgb 14.4---9.9  BP in the 50's---now 102/64  Will d/w brother  Critical care 40 mins

## 2016-12-07 NOTE — Clinical Social Work Note (Signed)
CSW confirmed with [facility] that bed is secure. Facility will initiate insurance auth. Per MD, no discharge today. Pt transferred to ICU. 1C CSW updated ICU CSW.   Darden Dates, MSW, LCSW  Clinical Social Worker [phone]

## 2016-12-07 NOTE — Consult Note (Signed)
Sarah Lame, MD [facility]  [address] [place], [place] [address] Phone: [phone] Fax : [phone]  Consultation  Referring Provider:     Dr. Posey Pronto Primary Care Physician:  Keith Rake, MD Primary Gastroenterologist:  none         Reason for Consultation:     Hematochezia  Date of Admission:  12/04/2016 Date of Consultation:  12/07/2016         HPI:   Sarah Horne is a 61 y.o. female who is admitted with bilateral lower extremity numbness and cold feet. The patient has a history of metastatic breast cancer. The patient was evaluated by Dr. Lucky Cowboy and no vascular intervention was needed. The patient was put on aspirin and Plavix. The patient bloody bowel movement. The patient was sent for a bleeding scan but became hypotensive and was transferred to the ICU prior to completing the bleeding scan. She is not able to give any history and upon entering the room the patient was being cleaned up from a large bloody bowel movement mixed with stools. There is no report of any complaints of abdominal pain or history of GI bleeding in the past.  Past Medical History:  Diagnosis Date  . Breast cancer ([place]) 2016   Left-radiation  . Cancer ([place]) 2016   Stage II, ER positive, PR positive, HER-2/neu overexpressing of the left breast.  . Chemotherapy-induced peripheral neuropathy ([place])   . Heart attack 1998  . Hyperlipidemia   . Hypertension   . Neuromuscular disorder ([facility])    neuropathy  . Neuropathy [facility])     Past Surgical History:  Procedure Laterality Date  . BREAST BIOPSY Left 2016   Positive  . BREAST LUMPECTOMY WITH SENTINEL LYMPH NODE BIOPSY Left 05/23/2015   Procedure: LEFT BREAST WIDE EXCISION WITH AXILLARY DISSECTION, MASTOPLASTY ;  Surgeon: [name], MD;  Location: [facility] ORS;  Service: General;  Laterality: Left;  . BREAST SURGERY Left 12/18/14   breast biopsy/INVASIVE DUCTAL CARCINOMA OF BREAST, NOTTINGHAM GRADE 2.  Marland Kitchen BREAST SURGERY  05/23/2015.   Wide  excision/mastoplasty, axillary dissection. No residual invasive cancer, positive for residual DCIS. 0/2 nodes identified on axillary dissection. (no SLN by technetium or methylene blue)  . CARDIAC CATHETERIZATION    . PORTACATH PLACEMENT Right 12-31-14   Dr Bary Castilla    Prior to Admission medications   Medication Sig Start Date End Date Taking? Authorizing Provider  anastrozole (ARIMIDEX) 1 MG tablet TAKE 1 TABLET DAILY 11/04/16  Yes Cammie Sickle, MD  aspirin 81 MG tablet Take 81 mg by mouth daily.   Yes Historical Provider, MD  carvedilol (COREG) 6.25 MG tablet Take 1 tablet (6.25 mg total) by mouth 2 (two) times daily. 05/07/16  Yes [name], MD  dexamethasone (DECADRON) 4 MG tablet Take 1 tablet (4 mg total) by mouth 3 (three) times daily. 11/22/16  Yes Lequita Asal, MD  diphenoxylate-atropine (LOMOTIL) 2.5-0.025 MG tablet Take 1 tablet by mouth 4 (four) times daily as needed for diarrhea or loose stools. Take it along with immodium 09/07/16  Yes Cammie Sickle, MD  DULoxetine (CYMBALTA) 60 MG capsule TAKE 1 CAPSULE (60 MG TOTAL) BY MOUTH DAILY. 11/24/16  Yes Cammie Sickle, MD  nystatin (MYCOSTATIN) 100000 UNIT/ML suspension Use as directed 5 mLs in the mouth or throat 4 (four) times daily.  12/02/16  Yes Historical Provider, MD  ondansetron (ZOFRAN) 8 MG tablet Take 1 tablet (8 mg total) by mouth every 8 (eight) hours as  needed for nausea or vomiting (start 3 days; after chemo). 11/26/16  Yes Cammie Sickle, MD  potassium chloride SA (K-DUR,KLOR-CON) 20 MEQ tablet 1 pill twice a day 10/28/16  Yes Cammie Sickle, MD  prochlorperazine (COMPAZINE) 10 MG tablet Take 1 tablet (10 mg total) by mouth every 6 (six) hours as needed for nausea or vomiting. 11/26/16  Yes Cammie Sickle, MD  clopidogrel (PLAVIX) 75 MG tablet Take 1 tablet (75 mg total) by mouth daily. 12/07/16   Fritzi Mandes, MD  feeding supplement, ENSURE ENLIVE, (ENSURE ENLIVE) LIQD Take 237 mLs by  mouth 2 (two) times daily between meals. 12/07/16   Fritzi Mandes, MD  polyethylene glycol (MIRALAX / GLYCOLAX) packet Take 17 g by mouth daily. 12/07/16   Fritzi Mandes, MD  senna-docusate (SENOKOT-S) 8.6-50 MG tablet Take 1 tablet by mouth at bedtime as needed for mild constipation. 12/07/16   Fritzi Mandes, MD    Family History  Problem Relation Age of Onset  . Breast cancer Maternal Aunt   . Breast cancer Cousin   . Brain cancer Maternal Uncle      Social History  Substance Use Topics  . Smoking status: Light Tobacco Smoker    Packs/day: 0.50    Years: 18.00    Types: Cigarettes  . Smokeless tobacco: Never Used  . Alcohol use No    Allergies as of 12/04/2016 - Review Complete 12/04/2016  Allergen Reaction Noted  . No known allergies  01/17/2015    Review of Systems:    All systems reviewed and negative except where noted in HPI.   Physical Exam:  Vital signs in last 24 hours: Temp:  [98.2 F (36.8 C)-98.5 F (36.9 C)] 98.2 F (36.8 C) (03/12 0419) Pulse Rate:  [78-85] 78 (03/12 0419) Resp:  [19-20] 20 (03/12 0419) BP: (107-126)/(60-74) 122/62 (03/12 0419) SpO2:  [99 %-100 %] 99 % (03/12 0419) Last BM Date: 12/07/16 General:  Obtunded and lethargic Head:  Normocephalic and atraumatic. Eyes:   No icterus.   Conjunctiva pink. PERRLA. Ears:  Normal auditory acuity. Neck:  Supple; no masses or thyroidomegaly Lungs: Respirations even and unlabored. Lungs clear to auscultation bilaterally.   No wheezes, crackles, or rhonchi.  Heart:  Regular rate and rhythm;  Without murmur, clicks, rubs or gallops Abdomen:  Soft, nondistended, nontender. Normal bowel sounds. No appreciable masses or hepatomegaly.  No rebound or guarding.  Rectal:  Not performed. Large amount of bloody stools in the bed. Msk:  Symmetrical without gross deformities.    Extremities:  Without edema, cyanosis or clubbing. Neurologic:  Alert and oriented x3;  grossly normal neurologically. Skin:  Intact without  significant lesions or rashes. Cervical Nodes:  No significant cervical adenopathy. Psych:  Alert and cooperative. Normal affect.  LAB RESULTS:  Recent Labs  12/05/16 0424 12/06/16 0823 12/07/16 0729  WBC 53.6* 58.6* 52.2*  HGB 12.7 11.8* 9.9*  HCT 38.9 35.8 29.6*  PLT 68* 73* 86*   BMET  Recent Labs  12/04/16 1325 12/04/16 1629  NA 134* 139  K 5.1 5.1  CL 101 105  CO2 27 28  GLUCOSE 149* 140*  BUN 43* 41*  CREATININE 0.61 0.62  CALCIUM 8.1* 8.0*   LFT  Recent Labs  12/04/16 1629  PROT 6.1*  ALBUMIN 2.9*  AST 219*  ALT 474*  ALKPHOS 352*  BILITOT 2.1*   PT/INR  Recent Labs  12/04/16 1629  LABPROT 13.5  INR 1.03    STUDIES: No results found.  Impression / Plan:   Sarah Horne is a 61 y.o. y/o female with a GI bleed. The patient is hypotensive in the GI bleeding may be from an upper or lower source although a small bowel bleed cannot be ruled out. The patient should have all anticoagulation and vascular surgery  Has been called. I have also asked the nurses to drop an NG tube to see if there is any source of an upper GI bleed. The patient is not stable enough for any endoscopic intervention at this time. She is also unprepped for any sort of lower GI bleed investigation with a  Colonoscope.I have discussed the grave nature of the patient's condition with the patient's brother. We are awaiting vascular surgeries recommendations for whether the patient would be a candidate for vascular intervention versus surgery.  Thank you for involving me in the care of this patient.      LOS: 3 days   Sarah Lame, MD  12/07/2016, 10:55 AM   Note: This dictation was prepared with Dragon dictation along with smaller phrase technology. Any transcriptional errors that result from this process are unintentional.

## 2016-12-07 NOTE — Progress Notes (Signed)
[facility]  Daily Progress Note   Subjective  - * No surgery found *  Events of today reviewed.  Brisk lower GI bleeding with resultant low BP and anemia today.  Have been awaiting the nuclear medicine scan for a few hours now and she has not been stable enough to get this done so far.  BP better now and planning to try again. She remains awake but a little more lethargic than when I saw her Friday.  No pain.  Objective Vitals:   12/07/16 1230 12/07/16 1245 12/07/16 1300 12/07/16 1315  BP: (!) 72/54  (!) 73/58 (!) 83/58  Pulse: (!) 107 (!) 102 (!) 106   Resp: (!) 21 (!) 24 (!) 23 (!) 24  Temp: 97.9 F (36.6 C) 97.9 F (36.6 C) 97.9 F (36.6 C) 97.9 F (36.6 C)  TempSrc: Rectal Rectal Rectal   SpO2:      Weight:      Height:        Intake/Output Summary (Last 24 hours) at 12/07/16 1504 Last data filed at 12/07/16 1045  Gross per 24 hour  Intake           918.67 ml  Output                0 ml  Net           918.67 ml    PULM  CTAB CV  RRR VASC  Feet cool, no palpable pedal pulses present.  Laboratory CBC    Component Value Date/Time   WBC 52.2 (HH) 12/07/2016 0729   HGB 9.9 (L) 12/07/2016 0729   HGB 11.9 (L) 01/22/2015 1559   HCT 29.6 (L) 12/07/2016 0729   HCT 36.6 01/22/2015 1559   PLT 86 (L) 12/07/2016 0729   PLT 272 01/22/2015 1559    BMET    Component Value Date/Time   NA 139 12/04/2016 1629   NA 135 01/08/2015 0850   K 5.1 12/04/2016 1629   K 3.7 01/08/2015 0850   CL 105 12/04/2016 1629   CL 101 01/08/2015 0850   CO2 28 12/04/2016 1629   CO2 26 01/08/2015 0850   GLUCOSE 140 (H) 12/04/2016 1629   GLUCOSE 161 (H) 01/08/2015 0850   BUN 41 (H) 12/04/2016 1629   BUN 17 01/08/2015 0850   CREATININE 0.62 12/04/2016 1629   CREATININE 0.82 01/22/2015 1559   CALCIUM 8.0 (L) 12/04/2016 1629   CALCIUM 9.3 01/08/2015 0850   GFRNONAA >60 12/04/2016 1629   GFRNONAA >60 01/22/2015 1559   GFRAA >60 12/04/2016 1629   GFRAA >60  01/22/2015 1559    Assessment/Planning:    GI bleed.  With resultant hypotension and anemia.  This was clearly reasonably brisk with her low BP, but no bleeding last several hours.  Have discussed with primary service several times and if the source of bleeding can be identified, would be happy to provide embolization as an option if in an appropriate location.  She has been unable to get the bleeding scan at this point.  GI feels endoscopy of little benefit.  CT would be of little benefit.  Agree with continued volume as needed and now that her BP is better, would like to get NM scan if possible.  Discussed with family that was at bedside and with her oncologist as well.  Very difficult situation in a patient with advanced malignancy  Metastatic breast cancer.  Oncology seeing as well.  Poor prognosis  Pallor and cyanosis  of feet is stable.  Made worse today with low BP and pressors.  Was off of heparin for over 36 hours at this point.      Leotis Pain  12/07/2016, 3:04 PM

## 2016-12-07 NOTE — Significant Event (Signed)
Rapid Response Event Note  Overview: Time Called: 1015 Event Type: Hypotension  Initial Focused Assessment: Pt in procedure area, pt arousaible but decreased responsiveness. Pt sbp 70's. Pt bed saturated with bloody stool.  Interventions: Administered 1l bolus, notified PCP that pt being transferred to ICU, requested order for PRBC  Plan of Care (if not transferred): Pt transferred to ICU  Event Summary: Name of Physician Notified: Fritzi Mandes at 0034  Name of Consulting Physician Notified: Allen Norris at 1030  Outcome: Transferred (Comment) (pt transferred to ICU due to hypotension and gi bleed)  Event End Time: [place]

## 2016-12-07 NOTE — Progress Notes (Signed)
[place] [facility] [employer] NAME: Sarah Horne    MR#:  [id number]  DATE OF BIRTH:  1955/12/26  SUBJECTIVE:   2 moderate rectal bleed episodes this am. Pt says she was constipated and had to strain to have BM  Weakness + REVIEW OF SYSTEMS:   Review of Systems  Constitutional: Negative for chills, fever and weight loss.  HENT: Negative for ear discharge, ear pain and nosebleeds.   Eyes: Negative for blurred vision, pain and discharge.  Respiratory: Negative for sputum production, shortness of breath, wheezing and stridor.   Cardiovascular: Negative for chest pain, palpitations, orthopnea and PND.  Gastrointestinal: Positive for blood in stool and constipation. Negative for abdominal pain, diarrhea, nausea and vomiting.  Genitourinary: Negative for frequency and urgency.  Musculoskeletal: Negative for back pain and joint pain.  Neurological: Positive for weakness. Negative for sensory change, speech change and focal weakness.  Psychiatric/Behavioral: Negative for depression and hallucinations. The patient is not nervous/anxious.    Tolerating Diet:yesTolerating PT: STR  DRUG ALLERGIES:   Allergies  Allergen Reactions  . No Known Allergies     VITALS:  Blood pressure 122/62, pulse 78, temperature 98.2 F (36.8 C), temperature source Oral, resp. rate 20, height '5\' 6"'$  (1.676 m), weight 71.8 kg (158 lb 3.2 oz), SpO2 99 %.  PHYSICAL EXAMINATION:   Physical Exam  GENERAL:  61 y.o.-year-old patient lying in the bed with no acute distress.  EYES: Pupils equal, round, reactive to light and accommodation. No scleral icterus. Extraocular muscles intact.  HEENT: Head atraumatic, normocephalic. Oropharynx and nasopharynx clear.  NECK:  Supple, no jugular venous distention. No thyroid enlargement, no tenderness.  LUNGS: Normal breath sounds bilaterally, no wheezing, rales, rhonchi. No use of accessory muscles of respiration.  CARDIOVASCULAR:  S1, S2 normal. No murmurs, rubs, or gallops.  ABDOMEN: Soft, nontender, nondistended. Bowel sounds present. No organomegaly or mass.  EXTREMITIES: No cyanosis, clubbing or edema b/l.   Good pedal pulses. Legs feel warm NEUROLOGIC: Cranial nerves II through XII are intact. No focal Motor or sensory deficits b/l.   PSYCHIATRIC:  patient is alert and oriented x 3.  SKIN: No obvious rash, lesion, or ulcer.   LABORATORY PANEL:  CBC  Recent Labs Lab 12/06/16 0823  WBC 58.6*  HGB 11.8*  HCT 35.8  PLT 73*    Chemistries   Recent Labs Lab 12/04/16 1629  NA 139  K 5.1  CL 105  CO2 28  GLUCOSE 140*  BUN 41*  CREATININE 0.62  CALCIUM 8.0*  AST 219*  ALT 474*  ALKPHOS 352*  BILITOT 2.1*   Cardiac Enzymes No results for input(s): TROPONINI in the last 168 hours. RADIOLOGY:  No results found. ASSESSMENT AND PLAN:   Sarah Horne  is a 61 y.o. female with a known history of Metastatic breast cancer was receiving chemotherapy at the cancer center was diagnosed with neuropathy bilateral lower extremities suspected due to cancer chemotherapy side effect comes to the emergency room from [place] after she was noted to have hold her feet during her visit of the cancer center. Patient did not have any palpable pulses in both lower extremities.  1. Bilateral lower extremity cyanosis and pallor with cold feet and only dopplerable pulses of present suspicion for peripheral arterial disease given history of tobacco abuse -IV heparin drip d/ced now -on ASA +  Plavix (added this admision)---hold now due to GI bleed -pt has good strong palpable pulses now -Patient was  seen by vascular surgery Dr. Lucky Cowboy. No vascular surgical intervention needed at this time  2. Metastatic breast cancer. Currently undergoing chemotherapy -Patient has history of peripheral neuropathy secondary side effects from chemotherapy  3. Rectal bleed today x2 with bloody clotts -stat CBC -STAT GI bleeding  scan -IVF -spoke with Dr Lucilla Lame  4.Leucocytosis appears reactive from po steroids (taking for solitary brain mets) and Neulasta on 11/26/16 -no s/o infection  5. Hyperlipidemia continue statins  6. Hypertension on Coreg   PT recommends rehab  Spoke with pt and she wants to hold off on calling her son right now since he is in [facility] and does not want him get worried.  Case discussed with Care Management/Social Worker. Management plans discussed with the patient, family and they are in agreement.  CODE STATUS:full DVT Prophylaxis: SCD TOTAL TIME TAKING CARE OF THIS PATIENT: 30 minutes.  >50% time spent on counselling and coordination of care Dr Allen Norris   Note: This dictation was prepared with Dragon dictation along with smaller phrase technology. Any transcriptional errors that result from this process are unintentional.  Sarah Horne M.D on 12/07/2016 at 7:25 AM  Between 7am to 6pm - Pager - [phone]  After 6pm go to [email or web address] - password [email or web address] [facility]  Office  [phone]  CC: Primary care physician; Keith Rake, MD

## 2016-12-07 NOTE — Progress Notes (Signed)
Pt left floor at 0755  To go to nuc med  For bleeding scan  Due to  Mod amt  Blood clots expelled  Via rectum. Dr patel on floor and  Saw pt  And ordered scan.  Cbc drawn via  Port  Prior to pt   Going to [employer].   Blood  Drawn from port   Per me in nuc med   x3 .  Bleeding scan unable to be completed  Due to pts condition deteriorating/ b/p dropped and pt  Gwynne Edinger out of rectum. Rapid response called and and dr patel notified and  Came to  nuc med to see pt.  Pt sent to ccu 4  From nuc med. Report  Given to ccu nurse.

## 2016-12-07 NOTE — Progress Notes (Signed)
Sarah Horne   DOB:January 03, 1956   FI#:[id number]    Subjective: Events noted. Patient had large bright red blood per rectum 3 episodes. She also found to be hypotensive systolic 41Y; she is currently in the ICU. Transfuse blood. She is currently awaiting angiogram. Evaluated by GI- holding off scope as she is unstable. Patient had been on IV heparin/aspirin and Plavix. Currently on hold  Patient currently on Levophed; systolic tenderness. Brother with the bedside.  Review of system: Cannot be obtained  Objective:  Vitals:   12/07/16 1445 12/07/16 1505  BP: 94/68 (!) 86/63  Pulse:  (!) 107  Resp: (!) 22 20  Temp: 98.4 F (36.9 C) 98.8 F (37.1 C)     Intake/Output Summary (Last 24 hours) at 12/07/16 1521 Last data filed at 12/07/16 1045  Gross per 24 hour  Intake           918.67 ml  Output                0 ml  Net           918.67 ml    GENERAL; drowsy but arousable no distress and comfortable. Accompanied by her brother. EYES: Positive for pallor. OROPHARYNX: no thrush or ulceration. NECK: supple, no masses felt LYMPH:  no palpable lymphadenopathy in the cervical, axillary or inguinal regions LUNGS: decreased breath sounds to auscultation at bases and  No wheeze or crackles HEART/CVS: regular rate & rhythm and no murmurs; No lower extremity edema ABDOMEN: abdomen soft, tender  on deep palpation. and normal bowel sounds Musculoskeletal:no cyanosis of digits and no clubbing  PSYCH: alert & oriented x 3 with fluent speech NEURO: no focal motor/sensory deficits SKIN:  no rashes or significant lesions; extremities cold to touch.   Labs:  Lab Results  Component Value Date   WBC 52.2 (HH) 12/07/2016   HGB 9.9 (L) 12/07/2016   HCT 29.6 (L) 12/07/2016   MCV 91.9 12/07/2016   PLT 86 (L) 12/07/2016   NEUTROABS 42.8 (H) 12/04/2016    Lab Results  Component Value Date   NA 139 12/04/2016   K 5.1 12/04/2016   CL 105 12/04/2016   CO2 28 12/04/2016    Studies:  Dg Abd 1  View  Result Date: 12/07/2016 CLINICAL DATA:  Status post NG tube placement. EXAM: ABDOMEN - 1 VIEW COMPARISON:  CT scan of the abdomen and pelvis of December 04, 2016 FINDINGS: The tip of the nasogastric tube lies in the proximal gastric body. The proximal port lies at or just below the level of the GE junction. IMPRESSION: Advancement of the nasogastric tube by 5-10 cm would be useful to assure that the proximal port remains below the GE junction. Electronically Signed   By: David  Martinique M.D.   On: 12/07/2016 11:18   Dg Abd Portable 1v  Result Date: 12/07/2016 CLINICAL DATA:  NG tube placement. EXAM: PORTABLE ABDOMEN - 1 VIEW 11:57 a.m. COMPARISON:  12/07/2016 at 10:56 a.m. FINDINGS: NG tube has been advanced and the tip is now in the distal body of the stomach. The bowel gas pattern is normal.  No visible free air or free fluid. IMPRESSION: NG tube tip is in the distal body of the stomach. Electronically Signed   By: Lorriane Shire M.D.   On: 12/07/2016 12:23    Assessment & Plan:   # 61 year old female patient with metastatic breast cancer- currently admitted to the hospital for bilateral small vessel ischemia of the lower extremities- on anti-coagulation;  noted to have bright red blood per rectum/hypotensive.  # Hemorrhagic shock/anemia status post PRBC transfusion- GI bleed question source of bleed-likely lower GI bleed; as NG tube negative for any blood. Awaiting angiogram. Vascular surgery/GI has been consulted. Discussed with Dr. Lucky Cowboy. Check PT/PTT; also recommend platelet transfusion- given dysfunctional platelets [on aspirin and Plavix].  # Bilateral lower extremity small vessel ischemia- recommend holding anticoagulation; given about discussed GI bleed.  # Metastatic breast cancer- with significant liver disease/ metastatic disease to the brain status post radiation.   # Leukocytosis- 50,000 per predominant neutrophilia; likely secondary to Neulasta. Infection unlikely.  # Above plan of  care was discussed with the patient's brother by the bedside. Also discussed with Dr. Lucky Cowboy.    Cammie Sickle, MD 12/07/2016  3:21 PM

## 2016-12-08 ENCOUNTER — Other Ambulatory Visit: Payer: BLUE CROSS/BLUE SHIELD

## 2016-12-08 ENCOUNTER — Encounter: Payer: Self-pay | Admitting: Gastroenterology

## 2016-12-08 ENCOUNTER — Inpatient Hospital Stay: Payer: BLUE CROSS/BLUE SHIELD | Admitting: Anesthesiology

## 2016-12-08 ENCOUNTER — Encounter
Admission: EM | Disposition: A | Discharge status: Skilled Nursing Facility | Payer: Self-pay | Source: Home / Self Care | Attending: Internal Medicine

## 2016-12-08 DIAGNOSIS — K209 Esophagitis, unspecified without bleeding: Secondary | ICD-10-CM

## 2016-12-08 DIAGNOSIS — R12 Heartburn: Secondary | ICD-10-CM

## 2016-12-08 DIAGNOSIS — K2970 Gastritis, unspecified, without bleeding: Secondary | ICD-10-CM

## 2016-12-08 DIAGNOSIS — K922 Gastrointestinal hemorrhage, unspecified: Secondary | ICD-10-CM

## 2016-12-08 DIAGNOSIS — K2980 Duodenitis without bleeding: Secondary | ICD-10-CM

## 2016-12-08 DIAGNOSIS — K921 Melena: Secondary | ICD-10-CM

## 2016-12-08 DIAGNOSIS — K253 Acute gastric ulcer without hemorrhage or perforation: Secondary | ICD-10-CM

## 2016-12-08 HISTORY — PX: ESOPHAGOGASTRODUODENOSCOPY (EGD) WITH PROPOFOL: SHX5813

## 2016-12-08 LAB — GLUCOSE, CAPILLARY
Glucose-Capillary: 84 mg/dL (ref 65–99)
Glucose-Capillary: 133 mg/dL — ABNORMAL HIGH (ref 65–99)

## 2016-12-08 LAB — BASIC METABOLIC PANEL
Anion gap: 4 — ABNORMAL LOW (ref 5–15)
BUN: 30 mg/dL — AB (ref 6–20)
CHLORIDE: 106 mmol/L (ref 101–111)
CO2: 22 mmol/L (ref 22–32)
Calcium: 6.3 mg/dL — CL (ref 8.9–10.3)
Creatinine, Ser: 0.4 mg/dL — ABNORMAL LOW (ref 0.44–1.00)
GFR calc Af Amer: >60 mL/min (ref >60)
GFR calc non Af Amer: >60 mL/min (ref >60)
GLUCOSE: 96 mg/dL (ref 65–99)
POTASSIUM: 3.9 mmol/L (ref 3.5–5.1)
Sodium: 132 mmol/L — ABNORMAL LOW (ref 135–145)

## 2016-12-08 LAB — PREPARE PLATELET PHERESIS: Unit division: 0

## 2016-12-08 LAB — CBC WITH DIFFERENTIAL/PLATELET
BASOS PCT: 0 %
Band Neutrophils: 6 %
Basophils Absolute: 0 10*3/uL (ref 0–0.1)
Blasts: 0 %
EOS PCT: 0 %
Eosinophils Absolute: 0 10*3/uL (ref 0–0.7)
HCT: 31.5 % — ABNORMAL LOW (ref 35.0–47.0)
Hemoglobin: 10.7 g/dL — ABNORMAL LOW (ref 12.0–16.0)
LYMPHS PCT: 3 %
Lymphs Abs: 1.7 10*3/uL (ref 1.0–3.6)
MCH: 28.4 pg (ref 26.0–34.0)
MCHC: 34 g/dL (ref 32.0–36.0)
MCV: 83.5 fL (ref 80.0–100.0)
MONOS PCT: 3 %
Metamyelocytes Relative: 3 %
Monocytes Absolute: 1.7 10*3/uL — ABNORMAL HIGH (ref 0.2–0.9)
Myelocytes: 1 %
NEUTROS PCT: 84 %
NRBC: 5 /100{WBCs} — AB
Neutro Abs: 53.1 10*3/uL — ABNORMAL HIGH (ref 1.4–6.5)
Other: 0 %
PLATELETS: 123 10*3/uL — AB (ref 150–440)
PROMYELOCYTES ABS: 0 %
RBC: 3.78 MIL/uL — ABNORMAL LOW (ref 3.80–5.20)
RDW: 18.9 % — ABNORMAL HIGH (ref 11.5–14.5)
WBC: 56.5 10*3/uL (ref 3.6–11.0)

## 2016-12-08 LAB — BPAM PLATELET PHERESIS
BLOOD PRODUCT EXPIRATION DATE: 201803142359
ISSUE DATE / TIME: 201803122040
Unit Type and Rh: 6200

## 2016-12-08 LAB — PATHOLOGIST SMEAR REVIEW

## 2016-12-08 LAB — HEMOGLOBIN: Hemoglobin: 11.4 g/dL — ABNORMAL LOW (ref 12.0–16.0)

## 2016-12-08 LAB — PLATELET COUNT: Platelets: 135 10*3/uL — ABNORMAL LOW (ref 150–440)

## 2016-12-08 SURGERY — ESOPHAGOGASTRODUODENOSCOPY (EGD) WITH PROPOFOL
Anesthesia: General

## 2016-12-08 MED ORDER — LIDOCAINE HCL (PF) 2 % IJ SOLN
INTRAMUSCULAR | Status: AC
  Filled 2016-12-08: qty 2

## 2016-12-08 MED ORDER — FENTANYL CITRATE (PF) 100 MCG/2ML IJ SOLN
INTRAMUSCULAR | Status: AC
  Filled 2016-12-08: qty 2

## 2016-12-08 MED ORDER — PANTOPRAZOLE SODIUM 40 MG IV SOLR
40.0000 mg | Freq: Two times a day (BID) | INTRAVENOUS | Status: DC
  Administered 2016-12-08 – 2016-12-10 (×6): 40 mg via INTRAVENOUS
  Filled 2016-12-08 (×6): qty 40

## 2016-12-08 MED ORDER — EPHEDRINE SULFATE 50 MG/ML IJ SOLN
INTRAMUSCULAR | Status: DC | PRN
  Administered 2016-12-08: 15 mg via INTRAVENOUS

## 2016-12-08 MED ORDER — SODIUM CHLORIDE 0.9 % IV SOLN: INTRAVENOUS | Status: DC

## 2016-12-08 MED ORDER — PHENYLEPHRINE HCL 10 MG/ML IJ SOLN
INTRAMUSCULAR | Status: DC | PRN
  Administered 2016-12-08: 100 ug via INTRAVENOUS
  Administered 2016-12-08: 50 ug via INTRAVENOUS

## 2016-12-08 MED ORDER — PROPOFOL 10 MG/ML IV BOLUS
INTRAVENOUS | Status: DC | PRN
  Administered 2016-12-08: 40 mg via INTRAVENOUS

## 2016-12-08 MED ORDER — MIDAZOLAM HCL 2 MG/2ML IJ SOLN
INTRAMUSCULAR | Status: AC
  Filled 2016-12-08: qty 2

## 2016-12-08 MED ORDER — PROPOFOL 500 MG/50ML IV EMUL
INTRAVENOUS | Status: DC | PRN
  Administered 2016-12-08: 140 ug/kg/min via INTRAVENOUS

## 2016-12-08 MED ORDER — FENTANYL CITRATE (PF) 100 MCG/2ML IJ SOLN
INTRAMUSCULAR | Status: DC | PRN
  Administered 2016-12-08: 25 ug via INTRAVENOUS

## 2016-12-08 MED ORDER — PROPOFOL 10 MG/ML IV BOLUS
INTRAVENOUS | Status: AC
  Filled 2016-12-08: qty 20

## 2016-12-08 MED ORDER — MIDAZOLAM HCL 2 MG/2ML IJ SOLN
INTRAMUSCULAR | Status: DC | PRN
  Administered 2016-12-08: 1 mg via INTRAVENOUS

## 2016-12-08 NOTE — Plan of Care (Signed)
Problem: Bowel/Gastric: Goal: Will show no signs and symptoms of gastrointestinal bleeding Outcome: Progressing Patient has not had any signs of intestinal bleeding thus far in the shift. Will continue to monitor for bleeds.

## 2016-12-08 NOTE — Anesthesia Post-op Follow-up Note (Cosign Needed)
Anesthesia QCDR form completed.        

## 2016-12-08 NOTE — Plan of Care (Signed)
Problem: Activity: Goal: Risk for activity intolerance will decrease Outcome: Progressing Patient able to sit in chair this AM before procedure, required moderate assistance moving from chair to bed and back to bed from chair.

## 2016-12-08 NOTE — Anesthesia Postprocedure Evaluation (Signed)
Anesthesia Post Note  Patient: Sarah Horne  Procedure(s) Performed: Procedure(s) (LRB): ESOPHAGOGASTRODUODENOSCOPY (EGD) WITH PROPOFOL (N/A)  Patient location during evaluation: Endoscopy Anesthesia Type: General Level of consciousness: awake and alert and oriented Pain management: pain level controlled Vital Signs Assessment: post-procedure vital signs reviewed and stable Respiratory status: spontaneous breathing, nonlabored ventilation and respiratory function stable Cardiovascular status: blood pressure returned to baseline and stable Postop Assessment: no signs of nausea or vomiting Anesthetic complications: no     Last Vitals:  Vitals:   12/08/16 1100 12/08/16 1110  BP: 119/72 118/79  Pulse: 76 86  Resp: (!) 9 14  Temp:      Last Pain:  Vitals:   12/08/16 1030  TempSrc: Tympanic  PainSc:                  Alaynah Schutter

## 2016-12-08 NOTE — Transfer of Care (Signed)
Immediate Anesthesia Transfer of Care Note  Patient: Sarah Horne  Procedure(s) Performed: Procedure(s): ESOPHAGOGASTRODUODENOSCOPY (EGD) WITH PROPOFOL (N/A)  Patient Location: PACU  Anesthesia Type:General  Level of Consciousness: sedated  Airway & Oxygen Therapy: Patient Spontanous Breathing and Patient connected to face mask oxygen  Post-op Assessment: Report given to RN and Post -op Vital signs reviewed and stable  Post vital signs: Reviewed and stable  Last Vitals:  Vitals:   12/08/16 0943 12/08/16 1030  BP: 136/75 100/67  Pulse: 82 92  Resp: 16 16  Temp: 36.5 C 36.2 C    Last Pain:  Vitals:   12/08/16 1030  TempSrc: Tympanic  PainSc:          Complications: No apparent anesthesia complications

## 2016-12-08 NOTE — Progress Notes (Signed)
Sarah Horne at [place] NAME: Sarah Horne    MR#:  [id number]  DATE OF BIRTH:  1955-12-05  SUBJECTIVE:   Hasd a eventful day yday with GI bleed. Neg NM scan. Doing well this am One more dark stool 200cc. No pain. bp stable REVIEW OF SYSTEMS:   Review of Systems  Constitutional: Negative for chills, fever and weight loss.  HENT: Negative for ear discharge, ear pain and nosebleeds.   Eyes: Negative for blurred vision, pain and discharge.  Respiratory: Negative for sputum production, shortness of breath, wheezing and stridor.   Cardiovascular: Negative for chest pain, palpitations, orthopnea and PND.  Gastrointestinal: Negative for abdominal pain, diarrhea, nausea and vomiting.  Genitourinary: Negative for frequency and urgency.  Musculoskeletal: Negative for back pain and joint pain.  Neurological: Positive for weakness. Negative for sensory change, speech change and focal weakness.  Psychiatric/Behavioral: Negative for depression and hallucinations. The patient is not nervous/anxious.    Tolerating Diet:npoTolerating PT: STR  DRUG ALLERGIES:   Allergies  Allergen Reactions  . No Known Allergies     VITALS:  Blood pressure 121/81, pulse 88, temperature 98.6 F (37 C), temperature source Rectal, resp. rate 20, height '5\' 6"'$  (1.676 m), weight 70.1 kg (154 lb 8.7 oz), SpO2 100 %.  PHYSICAL EXAMINATION:   Physical Exam  GENERAL:  61 y.o.-year-old patient lying in the bed with no acute distress. pallor EYES: Pupils equal, round, reactive to light and accommodation. No scleral icterus. Extraocular muscles intact.  HEENT: Head atraumatic, normocephalic. Oropharynx and nasopharynx clear.  NECK:  Supple, no jugular venous distention. No thyroid enlargement, no tenderness.  LUNGS: Normal breath sounds bilaterally, no wheezing, rales, rhonchi. No use of accessory muscles of respiration.  CARDIOVASCULAR: S1, S2 normal. No murmurs, rubs,  or gallops.  ABDOMEN: Soft, nontender, nondistended. Bowel sounds present. No organomegaly or mass.  EXTREMITIES: No cyanosis, clubbing or edema b/l.   Good pedal pulses. Legs feel warm NEUROLOGIC: Cranial nerves II through XII are intact. No focal Motor or sensory deficits b/l.   PSYCHIATRIC:  patient is alert and oriented x 3.  SKIN: No obvious rash, lesion, or ulcer.   LABORATORY PANEL:  CBC  Recent Labs Lab 12/08/16 0606  WBC 56.5*  HGB 10.7*  HCT 31.5*  PLT 123*    Chemistries   Recent Labs Lab 12/04/16 1629 12/08/16 0606  NA 139 132*  K 5.1 3.9  CL 105 106  CO2 28 22  GLUCOSE 140* 96  BUN 41* 30*  CREATININE 0.62 0.40*  CALCIUM 8.0* 6.3*  AST 219*  --   ALT 474*  --   ALKPHOS 352*  --   BILITOT 2.1*  --    Cardiac Enzymes No results for input(s): TROPONINI in the last 168 hours. RADIOLOGY:  Dg Abd 1 View  Result Date: 12/07/2016 CLINICAL DATA:  Status post NG tube placement. EXAM: ABDOMEN - 1 VIEW COMPARISON:  CT scan of the abdomen and pelvis of December 04, 2016 FINDINGS: The tip of the nasogastric tube lies in the proximal gastric body. The proximal port lies at or just below the level of the GE junction. IMPRESSION: Advancement of the nasogastric tube by 5-10 cm would be useful to assure that the proximal port remains below the GE junction. Electronically Signed   By: David  Martinique M.D.   On: 12/07/2016 11:18   Nm Gi Blood Loss  Result Date: 12/07/2016 CLINICAL DATA:  Gastrointestinal bleeding and hypotension.  Anemia.  EXAM: NUCLEAR MEDICINE GASTROINTESTINAL BLEEDING SCAN TECHNIQUE: Sequential abdominal images were obtained following intravenous administration of Tc-63mlabeled red blood cells. RADIOPHARMACEUTICALS:  21.7 mCi Tc-961mn-vitro labeled red cells. COMPARISON:  12/04/2016 FINDINGS: In the first hour of imaging, no accumulation over the abdomen is identified to suggest gastrointestinal bleed. Radiopharmaceutical visible in the blood pool. In the second  hour of imaging, once again there was no active bleeding observed. There is some bladder activity in the second are which could be related to excretion of free technetium. Also correlate with any hematuria. IMPRESSION: 1. Over 2 hours of imaging, no active gastrointestinal bleed is visible. 2. Accumulation of activity in the urinary bladder may be from excretion of free technetium, also correlate with any hematuria. Electronically Signed   By: WaVan Clines.D.   On: 12/07/2016 18:41   Dg Abd Portable 1v  Result Date: 12/07/2016 CLINICAL DATA:  NG tube placement. EXAM: PORTABLE ABDOMEN - 1 VIEW 11:57 a.m. COMPARISON:  12/07/2016 at 10:56 a.m. FINDINGS: NG tube has been advanced and the tip is now in the distal body of the stomach. The bowel gas pattern is normal.  No visible free air or free fluid. IMPRESSION: NG tube tip is in the distal body of the stomach. Electronically Signed   By: JaLorriane Shire.D.   On: 12/07/2016 12:23   ASSESSMENT AND PLAN:   Sarah Horne a [age].o. female with a known history of Metastatic breast cancer was receiving chemotherapy at the cancer center was diagnosed with neuropathy bilateral lower extremities suspected due to cancer chemotherapy side effect [facility] was noted to have hold her feet during her visit of the cancer center. Patient did not have any palpable pulses in both lower extremities.  1. Hypovolemic shock due to GI bleed upper vs lower -14.4---9.9---2 units BP and 1 unit plt---11.2--11.7--10.7 -IV PPI BID -EGD and colonoscopy tomorrow per [name] GI scan negative -appreciate [name] [name]  2. Bilateral lower extremity cyanosis and pallor with cold feet and only dopplerable pulses of present suspicion for peripheral arterial disease given history of tobacco abuse -IV heparin drip d/ced now (got only for 24 hours) -hold  ASA and Plavix due to GI bleed -pt has good strong  palpable pulses now -Patient was seen by vascular surgery Dr. DELucky CowboyNo vascular surgical intervention needed at this time  3. Metastatic breast cancer. Currently undergoing chemotherapy -Patient has history of peripheral neuropathy secondary side effects from chemotherapy  4. Tobacco abuse counseled more than 4 minutes for smoking cessation  5. Hyperlipidemia continue statins  6. Hypertension held due to hypotension from GI bleed Was on Coreg  7. Leucocytosis appears reactive from po steroids (taking for solitary brain mets) -no s/o infection -UA pending  PT recommends rehab Case discussed with Care Management/Social Worker. Management plans discussed with the patient, family and they are in agreement.  CODE STATUS:full DVT Prophylaxis:SCD/teds TOTAL TIME TAKING CARE OF THIS PATIENT: 30 minutes.  >50% time spent on counselling and coordination of care Dr WoAllen Norris Note: This dictation was prepared with Dragon dictation along with smaller phrase technology. Any transcriptional errors that result from this process are unintentional.  Talmage Teaster M.D on 12/08/2016 at 8:38 AM  Between 7am to 6pm - Pager - [phone]  After 6pm go to [email or web address] - password [email or web address]  Office  [phone]: Primary care physician; SyKeith Rake  MD

## 2016-12-08 NOTE — Anesthesia Procedure Notes (Signed)
Date/Time: 12/08/2016 10:20 AM Performed by: Allean Found Pre-anesthesia Checklist: Patient identified, Emergency Drugs available, Suction available, Patient being monitored and Timeout performed Oxygen Delivery Method: Nasal cannula

## 2016-12-08 NOTE — Anesthesia Preprocedure Evaluation (Signed)
Anesthesia Evaluation    Airway Mallampati: II  TM Distance: >3 FB Neck ROM: Full    Dental  (+) Poor Dentition   Pulmonary neg sleep apnea, neg COPD, Current Smoker,    breath sounds clear to auscultation- rhonchi (-) wheezing      Cardiovascular hypertension, Pt. on medications (-) angina+ Past MI  (-) Cardiac Stents and (-) CABG  Rhythm:Regular Rate:Normal - Systolic murmurs and - Diastolic murmurs    Neuro/Psych negative psych ROS   GI/Hepatic Neg liver ROS, GIB    Endo/Other  negative endocrine ROSneg diabetes  Renal/GU negative Renal ROS     Musculoskeletal negative musculoskeletal ROS (+)   Abdominal (+) - obese,   Peds  Hematology  (+) anemia ,   Anesthesia Other Findings Past Medical History: 2016: Breast cancer ([facility])     Comment: Left-radiation 2016: Cancer ([facility])     Comment: Stage II, ER positive, PR positive, HER-2/neu               overexpressing of the left breast. No date: Chemotherapy-induced peripheral neuropathy ([facility]* 1998: Heart attack No date: Hyperlipidemia No date: Hypertension No date: Neuromuscular disorder ([facility])     Comment: neuropathy No date: Neuropathy ([facility])   Reproductive/Obstetrics                             Anesthesia Physical Anesthesia Plan  ASA: III  Anesthesia Plan: General   Post-op Pain Management:    Induction: Intravenous  Airway Management Planned: Natural Airway  Additional Equipment:   Intra-op Plan:   Post-operative Plan:   Informed Consent: I have reviewed the patients History and Physical, chart, labs and discussed the procedure including the risks, benefits and alternatives for the proposed anesthesia with the patient or authorized representative who has indicated his/her understanding and acceptance.   Dental advisory given  Plan Discussed with: CRNA and Anesthesiologist  Anesthesia Plan Comments:          Anesthesia Quick Evaluation

## 2016-12-08 NOTE — Consult Note (Signed)
PULMONARY / CRITICAL CARE MEDICINE   Name: Sarah Horne MRN: [id number] DOB: [birth year]    ADMISSION DATE:  12/04/2016 CONSULTATION DATE:  12/07/16  REFERRING MD : sona patel  CHIEF COMPLAINT: low BP, Gi Bleed  INITIAL PRESENTATION:admitted for lower ext ischemia, started on bloodthinners, developed acute GIB    HISTORY OF PRESENT ILLNESS:   paient alert and awake, NAD BP stable, not on vasopressors BP and mental status much improved Nuc scan NEG GI consult pending OK to  Transfer to gen med floor   3/9 admitted for presumed lower ext ischemia started on heparin 3/12 developed acute GIB/low BP, ICU admission 3/13 shock resolved   VITAL SIGNS: Temp:  [97.9 F (36.6 C)-99.5 F (37.5 C)] 98.6 F (37 C) (03/13 0730) Pulse Rate:  [80-117] 88 (03/13 0730) Resp:  [0-26] 20 (03/13 0730) BP: (72-149)/(46-86) 121/81 (03/13 0730) SpO2:  [90 %-100 %] 100 % (03/13 0730) Weight:  [154 lb 8.7 oz (70.1 kg)] 154 lb 8.7 oz (70.1 kg) (03/12 1100) HEMODYNAMICS:       Intake/Output Summary (Last 24 hours) at 12/08/16 0746 Last data filed at 12/08/16 0716  Gross per 24 hour  Intake          5298.34 ml  Output              400 ml  Net          4898.34 ml    PHYSICAL EXAMINATION:  GENERAL NAD HEAD: Normocephalic, atraumatic.  EYES: Pupils equal, round, reactive to light.  No scleral icterus.  MOUTH: Moist mucosal membrane. NECK: Supple. No thyromegaly. No nodules. No JVD.  PULMONARY: CTA b/L CARDIOVASCULAR: S1 and S2. Regular rate and rhythm. No murmurs, rubs, or gallops.  NEUROLOGIC:alerty and awake,  SKIN:intact,warm,dry   LABS:  CBC  Recent Labs Lab 12/06/16 0823 12/07/16 0729 12/07/16 2220 12/07/16 2341 12/08/16 0606  WBC 58.6* 52.2*  --   --  56.5*  HGB 11.8* 9.9* 11.2* 11.4* 10.7*  HCT 35.8 29.6*  --   --  31.5*  PLT 73* 86*  --  135* 123*   Coag's  Recent Labs Lab 12/04/16 1629 12/07/16 1515  APTT 24  --   INR 1.03 1.51   BMET  Recent  Labs Lab 12/04/16 1325 12/04/16 1629 12/08/16 0606  NA 134* 139 132*  K 5.1 5.1 3.9  CL 101 105 106  CO2 '27 28 22  '$ BUN 43* 41* 30*  CREATININE 0.61 0.62 0.40*  GLUCOSE 149* 140* 96   Electrolytes  Recent Labs Lab 12/04/16 1325 12/04/16 1629 12/08/16 0606  CALCIUM 8.1* 8.0* 6.3*   Sepsis Markers No results for input(s): LATICACIDVEN, PROCALCITON, O2SATVEN in the last 168 hours. ABG No results for input(s): PHART, PCO2ART, PO2ART in the last 168 hours. Liver Enzymes  Recent Labs Lab 12/04/16 1325 12/04/16 1629  AST 218* 219*  ALT 463* 474*  ALKPHOS 373* 352*  BILITOT 2.2* 2.1*  ALBUMIN 2.9* 2.9*   Cardiac Enzymes No results for input(s): TROPONINI, PROBNP in the last 168 hours. Glucose  Recent Labs Lab 12/07/16 1031 12/08/16 0736  GLUCAP 142* 84    Imaging Dg Abd 1 View  Result Date: 12/07/2016 CLINICAL DATA:  Status post NG tube placement. EXAM: ABDOMEN - 1 VIEW COMPARISON:  CT scan of the abdomen and pelvis of December 04, 2016 FINDINGS: The tip of the nasogastric tube lies in the proximal gastric body. The proximal port lies at or just below the level of the GE junction.  IMPRESSION: Advancement of the nasogastric tube by 5-10 cm would be useful to assure that the proximal port remains below the GE junction. Electronically Signed   By: David  Martinique M.D.   On: 12/07/2016 11:18   Nm Gi Blood Loss  Result Date: 12/07/2016 CLINICAL DATA:  Gastrointestinal bleeding and hypotension.  Anemia. EXAM: NUCLEAR MEDICINE GASTROINTESTINAL BLEEDING SCAN TECHNIQUE: Sequential abdominal images were obtained following intravenous administration of Tc-67mlabeled red blood cells. RADIOPHARMACEUTICALS:  21.7 mCi Tc-930mn-vitro labeled red cells. COMPARISON:  12/04/2016 FINDINGS: In the first hour of imaging, no accumulation over the abdomen is identified to suggest gastrointestinal bleed. Radiopharmaceutical visible in the blood pool. In the second hour of imaging, once again there  was no active bleeding observed. There is some bladder activity in the second are which could be related to excretion of free technetium. Also correlate with any hematuria. IMPRESSION: 1. Over 2 hours of imaging, no active gastrointestinal bleed is visible. 2. Accumulation of activity in the urinary bladder may be from excretion of free technetium, also correlate with any hematuria. Electronically Signed   By: WaVan Clines.D.   On: 12/07/2016 18:41   Dg Abd Portable 1v  Result Date: 12/07/2016 CLINICAL DATA:  NG tube placement. EXAM: PORTABLE ABDOMEN - 1 VIEW 11:57 a.m. COMPARISON:  12/07/2016 at 10:56 a.m. FINDINGS: NG tube has been advanced and the tip is now in the distal body of the stomach. The bowel gas pattern is normal.  No visible free air or free fluid. IMPRESSION: NG tube tip is in the distal body of the stomach. Electronically Signed   By: JaLorriane Shire.D.   On: 12/07/2016 12:23     ASSESSMENT / PLAN:  [age]o AAF with acute and severe hypovolumic shock from acute GIB ?etiologgy /source with underlying lung cancer with mets with encephalopathy   PULMONARY No acute issues  CARDIOVASCULAR -no need for CVl Shock resolved  RENAL Watch UOP  GASTROINTESTINAL Follow up Gi Recs NPO for now    HEMATOLOGIC Transfuse to keep HGB>7  INFECTIOUS No abx needed at this time No signs of infection at this time  OK to transfer to gen med floor  Dreshon Proffit DaPatricia PesaM.D.  [facility]  Medical Director [place]

## 2016-12-08 NOTE — Op Note (Signed)
[facility] Gastroenterology Patient Name: Sarah Horne Procedure Date: 12/08/2016 9:56 AM MRN: [id number] Account #: [id number] Date of Birth: 12/01/1955 Admit Type: Inpatient Age: 61 Room: [facility] ENDO ROOM 4 Gender: Female Note Status: Finalized Procedure:            Upper GI endoscopy Indications:          Melena Providers:            Lucilla Lame MD, MD Referring MD:         Otila Back. Manuella Ghazi (Referring MD) Medicines:            Propofol per Anesthesia Complications:        No immediate complications. Procedure:            Pre-Anesthesia Assessment:                       - Prior to the procedure, a History and Physical was                        performed, and patient medications and allergies were                        reviewed. The patient's tolerance of previous                        anesthesia was also reviewed. The risks and benefits of                        the procedure and the sedation options and risks were                        discussed with the patient. All questions were                        answered, and informed consent was obtained. Prior                        Anticoagulants: The patient has taken no previous                        anticoagulant or antiplatelet agents. ASA Grade                        Assessment: II - A patient with mild systemic disease.                        After reviewing the risks and benefits, the patient was                        deemed in satisfactory condition to undergo the                        procedure.                       After obtaining informed consent, the endoscope was                        passed under direct vision. Throughout the procedure,  the patient's blood pressure, pulse, and oxygen                        saturations were monitored continuously. The Endoscope                        was introduced through the mouth, and advanced to the                        second part of  duodenum. The upper GI endoscopy was                        accomplished without difficulty. The patient tolerated                        the procedure well. Findings:      LA Grade B (one or more mucosal breaks greater than 5 mm, not extending       between the tops of two mucosal folds) esophagitis with no bleeding was       found in the lower third of the esophagus.      Two non-bleeding cratered gastric ulcers with no stigmata of bleeding       were found in the gastric antrum.      Localized moderate inflammation characterized by erosions was found in       the gastric antrum.      One non-bleeding cratered duodenal ulcer with no stigmata of bleeding       was found in the duodenal bulb. Impression:           - LA Grade B esophagitis.                       - Non-bleeding gastric ulcers with no stigmata of                        bleeding.                       - Gastritis.                       - One non-bleeding duodenal ulcer with no stigmata of                        bleeding.                       - No specimens collected. Recommendation:       - Discharge patient to home.                       - Resume previous diet.                       - Continue present medications.                       - Use a proton pump inhibitor PO daily. Procedure Code(s):    --- Professional ---                       [phone], Esophagogastroduodenoscopy, flexible, transoral;  diagnostic, including collection of specimen(s) by                        brushing or washing, when performed (separate procedure) Diagnosis Code(s):    --- Professional ---                       K92.1, Melena (includes Hematochezia)                       K20.9, Esophagitis, unspecified                       K25.9, Gastric ulcer, unspecified as acute or chronic,                        without hemorrhage or perforation                       K29.70, Gastritis, unspecified, without bleeding                        K26.9, Duodenal ulcer, unspecified as acute or chronic,                        without hemorrhage or perforation CPT copyright 2016 American [employer]. All rights reserved. The codes documented in this report are preliminary and upon coder review may  be revised to meet current compliance requirements. Lucilla Lame MD, MD 12/08/2016 10:30:56 AM This report has been signed electronically. Number of Addenda: 0 Note Initiated On: 12/08/2016 9:56 AM      [facility]

## 2016-12-08 NOTE — Plan of Care (Signed)
Problem: Physical Regulation: Goal: Complications related to the disease process, condition or treatment will be avoided or minimized Outcome: Progressing Patient vital signs have remained stable this shift, hemoglobin has remained stable, LOC has remained at baseline this shift. All of these complications related to GI bleeding have been avoided this shift.

## 2016-12-09 ENCOUNTER — Encounter
Admission: EM | Disposition: A | Discharge status: Skilled Nursing Facility | Payer: Self-pay | Source: Home / Self Care | Attending: Internal Medicine

## 2016-12-09 ENCOUNTER — Other Ambulatory Visit: Payer: Self-pay | Admitting: Internal Medicine

## 2016-12-09 DIAGNOSIS — K259 Gastric ulcer, unspecified as acute or chronic, without hemorrhage or perforation: Secondary | ICD-10-CM

## 2016-12-09 DIAGNOSIS — Z7982 Long term (current) use of aspirin: Secondary | ICD-10-CM

## 2016-12-09 DIAGNOSIS — Z79899 Other long term (current) drug therapy: Secondary | ICD-10-CM

## 2016-12-09 DIAGNOSIS — Z170 Estrogen receptor positive status [ER+]: Secondary | ICD-10-CM

## 2016-12-09 DIAGNOSIS — Z79811 Long term (current) use of aromatase inhibitors: Secondary | ICD-10-CM

## 2016-12-09 DIAGNOSIS — K922 Gastrointestinal hemorrhage, unspecified: Secondary | ICD-10-CM

## 2016-12-09 LAB — CBC
HCT: 31.3 % — ABNORMAL LOW (ref 35.0–47.0)
Hemoglobin: 10.6 g/dL — ABNORMAL LOW (ref 12.0–16.0)
MCH: 28.4 pg (ref 26.0–34.0)
MCHC: 33.9 g/dL (ref 32.0–36.0)
MCV: 83.7 fL (ref 80.0–100.0)
PLATELETS: 113 10*3/uL — AB (ref 150–440)
RBC: 3.74 MIL/uL — AB (ref 3.80–5.20)
RDW: 18.9 % — AB (ref 11.5–14.5)
WBC: 57.3 10*3/uL (ref 3.6–11.0)

## 2016-12-09 SURGERY — COLONOSCOPY WITH PROPOFOL
Anesthesia: General

## 2016-12-09 MED ORDER — PEG 3350-KCL-NA BICARB-NACL 420 G PO SOLR
4000.0000 mL | Freq: Once | ORAL | Status: DC
  Filled 2016-12-09: qty 4000

## 2016-12-09 MED ORDER — DEXAMETHASONE 4 MG PO TABS
2.0000 mg | ORAL_TABLET | Freq: Three times a day (TID) | ORAL | Status: DC
  Administered 2016-12-09 (×2): 2 mg via ORAL
  Filled 2016-12-09 (×2): qty 1

## 2016-12-09 MED ORDER — PEG 3350-KCL-NA BICARB-NACL 420 G PO SOLR
4000.0000 mL | Freq: Once | ORAL | Status: AC
  Administered 2016-12-09: 15:00:00 4000 mL via ORAL
  Filled 2016-12-09: qty 4000

## 2016-12-09 NOTE — Progress Notes (Signed)
[facility] NAME: Sarah Horne    MR#:  [id number]  DATE OF BIRTH:  [birth year]  SUBJECTIVE:  Doing well. No more bleeding. tolearting CLD  REVIEW OF SYSTEMS:   Review of Systems  Constitutional: Negative for chills, fever and weight loss.  HENT: Negative for ear discharge, ear pain and nosebleeds.   Eyes: Negative for blurred vision, pain and discharge.  Respiratory: Negative for sputum production, shortness of breath, wheezing and stridor.   Cardiovascular: Negative for chest pain, palpitations, orthopnea and PND.  Gastrointestinal: Positive for melena. Negative for abdominal pain, diarrhea, nausea and vomiting.  Genitourinary: Negative for frequency and urgency.  Musculoskeletal: Negative for back pain and joint pain.  Neurological: Positive for weakness. Negative for sensory change, speech change and focal weakness.  Psychiatric/Behavioral: Negative for depression and hallucinations. The patient is not nervous/anxious.    Tolerating Diet:CLD Tolerating PT: rec rehab  DRUG ALLERGIES:   Allergies  Allergen Reactions  . No Known Allergies     VITALS:  Blood pressure 132/77, pulse 84, temperature 98.8 F (37.1 C), temperature source Oral, resp. rate 18, height '5\' 6"'$  (1.676 m), weight 80.2 kg (176 lb 11.2 oz), SpO2 99 %.  PHYSICAL EXAMINATION:   Physical Exam   GENERAL:  61 y.o.-year-old patient lying in the bed with no acute distress.  EYES: Pupils equal, round, reactive to light and accommodation. No scleral icterus. Extraocular muscles intact. Pallor+ HEENT: Head atraumatic, normocephalic. Oropharynx and nasopharynx clear.  NECK:  Supple, no jugular venous distention. No thyroid enlargement, no tenderness.  LUNGS: Normal breath sounds bilaterally, no wheezing, rales, rhonchi. No use of accessory muscles of respiration.  CARDIOVASCULAR: S1, S2 normal. No murmurs, rubs, or gallops.  ABDOMEN: Soft, nontender,  nondistended. Bowel sounds present. No organomegaly or mass.  EXTREMITIES: No cyanosis, clubbing or edema b/l.    NEUROLOGIC: Cranial nerves II through XII are intact. No focal Motor or sensory deficits b/l.   PSYCHIATRIC:  patient is alert and oriented x 3.  SKIN: No obvious rash, lesion, or ulcer.   LABORATORY PANEL:  CBC  Recent Labs Lab 12/09/16 0406  WBC 57.3*  HGB 10.6*  HCT 31.3*  PLT 113*    Chemistries   Recent Labs Lab 12/04/16 1629 12/08/16 0606  NA 139 132*  K 5.1 3.9  CL 105 106  CO2 28 22  GLUCOSE 140* 96  BUN 41* 30*  CREATININE 0.62 0.40*  CALCIUM 8.0* 6.3*  AST 219*  --   ALT 474*  --   ALKPHOS 352*  --   BILITOT 2.1*  --    Cardiac Enzymes No results for input(s): TROPONINI in the last 168 hours. RADIOLOGY:  Nm Gi Blood Loss  Result Date: 12/07/2016 CLINICAL DATA:  Gastrointestinal bleeding and hypotension.  Anemia. EXAM: NUCLEAR MEDICINE GASTROINTESTINAL BLEEDING SCAN TECHNIQUE: Sequential abdominal images were obtained following intravenous administration of Tc-45mlabeled red blood cells. RADIOPHARMACEUTICALS:  21.7 mCi Tc-960mn-vitro labeled red cells. COMPARISON:  12/04/2016 FINDINGS: In the first hour of imaging, no accumulation over the abdomen is identified to suggest gastrointestinal bleed. Radiopharmaceutical visible in the blood pool. In the second hour of imaging, once again there was no active bleeding observed. There is some bladder activity in the second are which could be related to excretion of free technetium. Also correlate with any hematuria. IMPRESSION: 1. Over 2 hours of imaging, no active gastrointestinal bleed is visible. 2. Accumulation of activity in the urinary bladder may  be from excretion of free technetium, also correlate with any hematuria. Electronically Signed   By: Van Clines M.D.   On: 12/07/2016 18:41   ASSESSMENT AND PLAN:  Sarah Horne a 61 y.o. femalewith a known history of Metastatic breast cancer  was receiving chemotherapy at the cancer center was diagnosed with neuropathy bilateral lower extremities suspected due to cancer chemotherapy side effect comes to the emergency room from [place] after she was noted to have hold her feet during her visit of the cancer center. Patient did not have any palpable pulses in both lower extremities.  1. Hypovolemic shock due to GI bleed upper due PUG (EGD showed gastritis and nonbleeding peptic ulcer) -14.4---9.9---2 units BP and 1 unit plt---11.2--11.7--10.7 -IV PPI BID -s/p EDG on 3/12 showed gastric ulcers, nonbleeding and gastritis. colonoscopy tomorrow per Dr Vicente Males -NM GI scan negative -appreciate Dr Lucky Cowboy and Dr Lynnell Jude input  2. Bilateral lower extremity cyanosis and pallor with cold feet and only dopplerable pulses of present suspicion for peripheral arterial disease given history of tobacco abuse -IV heparin drip d/ced now (got only for 24 hours) -hold  ASA and Plavix due to GI bleed -pt has good strong palpable pulses now -Patient was seen by vascular surgery Dr. Lucky Cowboy. No vascular surgical intervention needed at this time  3. Metastatic breast cancer. Currently undergoing chemotherapy -Patient has history of peripheral neuropathy secondary side effects from chemotherapy -weaning po decadron due to ulcers (d/w Dr Rogue Bussing)  4. Tobacco abuse counseled more than 4 minutes for smoking cessation  5. Hyperlipidemia continue statins  6. Hypertension held due to hypotension from GI bleed Was on Coreg  7. Leucocytosis appears reactive from po steroids (taking for solitary brain mets) and neulasta -no s/o infection  CSW for rehab placement  Case discussed with Care Management/Social Worker. Management plans discussed with the patient, family and they are in agreement.  CODE STATUS: full  DVT Prophylaxis: SCD  TOTAL TIME TAKING CARE OF THIS PATIENT: 40 minutes.  >50% time spent on counselling and coordination of  care  POSSIBLE D/C IN 1 DAYS, DEPENDING ON CLINICAL CONDITION.  Note: This dictation was prepared with Dragon dictation along with smaller phrase technology. Any transcriptional errors that result from this process are unintentional.  Harli Engelken M.D on 12/09/2016 at 1:58 PM  Between 7am to 6pm - Pager - [phone]  After 6pm go to [email or web address] - password [email or web address] [facility]  Office  [phone]  CC: Primary care physician; Keith Rake, MD

## 2016-12-09 NOTE — Progress Notes (Signed)
Sarah Horne   DOB:1955-10-20   FY#:[id number]    Subjective: Patient is resting in bed; accompanied by her son. She is resting comfortably. Denies any more bloody stools. s/p colonoscopy yesterday; s/p clipping of bleeding ulcer in sigmoid colon. Pt plans to discharged to rehab today.   Objective:  Vitals:   12/11/16 0103 12/11/16 0415  BP: 117/63 (!) 119/59  Pulse: 75 75  Resp:  20  Temp:  98.6 F (37 C)     Intake/Output Summary (Last 24 hours) at 12/11/16 1024 Last data filed at 12/11/16 0431  Gross per 24 hour  Intake              240 ml  Output              700 ml  Net             -460 ml    GENERAL Alert, no distress and comfortable.  EYES: no pallor or icterus OROPHARYNX: no thrush or ulceration. NECK: supple, no masses felt LYMPH:  no palpable lymphadenopathy in the cervical, axillary or inguinal regions LUNGS: decreased breath sounds to auscultation at bases and  No wheeze or crackles HEART/CVS: regular rate & rhythm and no murmurs; No lower extremity edema; pedal pulses- present BIl.  ABDOMEN: abdomen soft, tender  on deep palpation. and normal bowel sounds Musculoskeletal:no cyanosis of digits and no clubbing  PSYCH: alert & oriented x 3 with fluent speech NEURO: no focal motor/sensory deficits SKIN:  no rashes or significant lesions   Labs:  Lab Results  Component Value Date   WBC 57.3 (HH) 12/09/2016   HGB 10.6 (L) 12/09/2016   HCT 31.3 (L) 12/09/2016   MCV 83.7 12/09/2016   PLT 113 (L) 12/09/2016   NEUTROABS 53.1 (H) 12/08/2016    Lab Results  Component Value Date   NA 132 (L) 12/08/2016   K 3.9 12/08/2016   CL 106 12/08/2016   CO2 22 12/08/2016    Studies:  No results found.  Assessment & Plan:    # 61 year old female patient with metastatic breast cancer- currently admitted to the hospital for bilateral small vessel ischemia of the lower extremities- on anti-coagulation; noted to have GI bleed  # GI bleed- leading to acute anemia  hemorrhagic shock-s/p colonoscopy-sigmoid ulcer s/p clipping.  bleeding resolved  #  Bilateral lower extremity small vessel ischemia-currently off anticoagulation because of bleeding. ? Related to chemotherapy vs other causes After restarting aspirin once patient is medically stable.  # Metastatic breast cancer- with significant liver disease- status post Cytoxan and Taxotere cycle #1 on 3/1; patient due for cycle #2 on 3/22.   #/ metastatic disease to the brain status post radiation. Continue tapering steroids.   # Leukocytosis- 50,000 per predominant neutrophilia; likely secondary to Neulasta/steroids. Infection unlikely.  # Above plan of care was discussed with the patient's son by the bedside. Discussed with Dr.Patel. Follow up in 1 week.     Cammie Sickle, MD 12/11/2016  10:24 AM

## 2016-12-09 NOTE — Progress Notes (Signed)
BP stable awaiting GI recs NO ICU needs at this time  Will sign off. Please call with any questions

## 2016-12-09 NOTE — Progress Notes (Signed)
Sarah Horne   DOB:08-Dec-1955   HK#:[id number]    Subjective: Patient sitting in the chair; accompanied by her son. She is resting comfortably. Denies any more bloody stools.  Nausea no vomiting. Tolerating clears. Awaiting colonoscopy tomorrow.  Review of system: No chest pain or shortness of breath or cough. No nausea no vomiting. Denies any dizzy spells. A complete 10 point review of system is done which is negative for mentioned above in history of present illness   Objective:  Vitals:   12/09/16 1146 12/09/16 1418  BP: 132/77 (!) 148/77  Pulse: 84 77  Resp: 18 20  Temp: 98.8 F (37.1 C) 98.2 F (36.8 C)     Intake/Output Summary (Last 24 hours) at 12/09/16 1727 Last data filed at 12/09/16 0600  Gross per 24 hour  Intake              700 ml  Output                0 ml  Net              700 ml    GENERAL; sitting in the chair; no distress and comfortable. Accompanied by her son.  EYES: Positive for pallor. OROPHARYNX: no thrush or ulceration. NECK: supple, no masses felt LYMPH:  no palpable lymphadenopathy in the cervical, axillary or inguinal regions LUNGS: decreased breath sounds to auscultation at bases and  No wheeze or crackles HEART/CVS: regular rate & rhythm and no murmurs; No lower extremity edema ABDOMEN: abdomen soft, tender  on deep palpation. and normal bowel sounds Musculoskeletal:no cyanosis of digits and no clubbing  PSYCH: alert & oriented x 3 with fluent speech NEURO: no focal motor/sensory deficits SKIN:  no rashes or significant lesions;    Labs:  Lab Results  Component Value Date   WBC 57.3 (HH) 12/09/2016   HGB 10.6 (L) 12/09/2016   HCT 31.3 (L) 12/09/2016   MCV 83.7 12/09/2016   PLT 113 (L) 12/09/2016   NEUTROABS 53.1 (H) 12/08/2016    Lab Results  Component Value Date   NA 132 (L) 12/08/2016   K 3.9 12/08/2016   CL 106 12/08/2016   CO2 22 12/08/2016    Studies:  Nm Gi Blood Loss  Result Date: 12/07/2016 CLINICAL DATA:   Gastrointestinal bleeding and hypotension.  Anemia. EXAM: NUCLEAR MEDICINE GASTROINTESTINAL BLEEDING SCAN TECHNIQUE: Sequential abdominal images were obtained following intravenous administration of Tc-93mlabeled red blood cells. RADIOPHARMACEUTICALS:  21.7 mCi Tc-920mn-vitro labeled red cells. COMPARISON:  12/04/2016 FINDINGS: In the first hour of imaging, no accumulation over the abdomen is identified to suggest gastrointestinal bleed. Radiopharmaceutical visible in the blood pool. In the second hour of imaging, once again there was no active bleeding observed. There is some bladder activity in the second are which could be related to excretion of free technetium. Also correlate with any hematuria. IMPRESSION: 1. Over 2 hours of imaging, no active gastrointestinal bleed is visible. 2. Accumulation of activity in the urinary bladder may be from excretion of free technetium, also correlate with any hematuria. Electronically Signed   By: WaVan Clines.D.   On: 12/07/2016 18:41    Assessment & Plan:   # [age]ear old female patient with metastatic breast cancer- currently admitted to the hospital for bilateral small vessel ischemia of the lower extremities- on anti-coagulation; noted to have GI bleed  # GI bleed- leading to acute anemia hemorrhagic shock-nuclear medicine scan negative for bleeding. EGD showed non-bleeding gastric ulcer; awaiting colonoscopy  tomorrow. Currently off anticoagulation.  #  Bilateral lower extremity small vessel ischemia-currently off anticoagulation because of bleeding. After restarting aspirin once patient is medically stable.  # Metastatic breast cancer- with significant liver disease- status post Cytoxan and Taxotere cycle #1 on 3/1; patient due for cycle #2 on 3/22. However given the significant train of events not directly related to chemotherapy- has to be taken in consideration prior to second cycle of chemotherapy. Also discussed regarding alternative treatments  including CDK inhibitor; with Faslodex. Continue anastrozole follow-up.  #/ metastatic disease to the brain status post radiation. I reviewed with tapering the dose of steroids.   # Leukocytosis- 50,000 per predominant neutrophilia; likely secondary to Neulasta/steroids. Infection unlikely.  # Above plan of care was discussed with the patient's son by the bedside. Also discussed with Dr.Patel.     Cammie Sickle, MD 12/09/2016  5:27 PM

## 2016-12-10 ENCOUNTER — Inpatient Hospital Stay: Payer: BLUE CROSS/BLUE SHIELD | Admitting: Anesthesiology

## 2016-12-10 ENCOUNTER — Encounter: Payer: Self-pay | Admitting: Anesthesiology

## 2016-12-10 ENCOUNTER — Encounter
Admission: RE | Admit: 2016-12-10 | Discharge: 2016-12-10 | Disposition: A | Discharge status: Home / Self Care | Payer: BLUE CROSS/BLUE SHIELD | Source: Ambulatory Visit | Attending: Internal Medicine | Admitting: Internal Medicine

## 2016-12-10 ENCOUNTER — Encounter
Admission: EM | Disposition: A | Discharge status: Skilled Nursing Facility | Payer: Self-pay | Source: Home / Self Care | Attending: Internal Medicine

## 2016-12-10 DIAGNOSIS — K633 Ulcer of intestine: Secondary | ICD-10-CM

## 2016-12-10 DIAGNOSIS — C50412 Malignant neoplasm of upper-outer quadrant of left female breast: Secondary | ICD-10-CM

## 2016-12-10 DIAGNOSIS — I951 Orthostatic hypotension: Secondary | ICD-10-CM | POA: Insufficient documentation

## 2016-12-10 DIAGNOSIS — D72829 Elevated white blood cell count, unspecified: Secondary | ICD-10-CM | POA: Insufficient documentation

## 2016-12-10 DIAGNOSIS — N189 Chronic kidney disease, unspecified: Secondary | ICD-10-CM | POA: Insufficient documentation

## 2016-12-10 HISTORY — PX: COLONOSCOPY WITH PROPOFOL: SHX5780

## 2016-12-10 SURGERY — COLONOSCOPY WITH PROPOFOL
Anesthesia: General

## 2016-12-10 MED ORDER — LIDOCAINE 2% (20 MG/ML) 5 ML SYRINGE
INTRAMUSCULAR | Status: DC | PRN
  Administered 2016-12-10: 30 mg via INTRAVENOUS

## 2016-12-10 MED ORDER — PROPOFOL 500 MG/50ML IV EMUL
INTRAVENOUS | Status: DC | PRN
  Administered 2016-12-10: 140 ug/kg/min via INTRAVENOUS

## 2016-12-10 MED ORDER — FENTANYL CITRATE (PF) 100 MCG/2ML IJ SOLN
INTRAMUSCULAR | Status: DC | PRN
  Administered 2016-12-10: 50 ug via INTRAVENOUS

## 2016-12-10 MED ORDER — MIDAZOLAM HCL 2 MG/2ML IJ SOLN
INTRAMUSCULAR | Status: AC
  Filled 2016-12-10: qty 2

## 2016-12-10 MED ORDER — PROPOFOL 500 MG/50ML IV EMUL
INTRAVENOUS | Status: AC
  Filled 2016-12-10: qty 50

## 2016-12-10 MED ORDER — SODIUM CHLORIDE 0.9 % IV SOLN
INTRAVENOUS | Status: DC
  Administered 2016-12-10: 1000 mL via INTRAVENOUS

## 2016-12-10 MED ORDER — FENTANYL CITRATE (PF) 100 MCG/2ML IJ SOLN
INTRAMUSCULAR | Status: AC
  Filled 2016-12-10: qty 2

## 2016-12-10 MED ORDER — ASPIRIN EC 81 MG PO TBEC
81.0000 mg | DELAYED_RELEASE_TABLET | Freq: Every day | ORAL | Status: DC
  Administered 2016-12-10 – 2016-12-11 (×2): 81 mg via ORAL
  Filled 2016-12-10 (×2): qty 1

## 2016-12-10 MED ORDER — PHENYLEPHRINE HCL 10 MG/ML IJ SOLN
INTRAMUSCULAR | Status: DC | PRN
  Administered 2016-12-10: 100 ug via INTRAVENOUS

## 2016-12-10 MED ORDER — DEXAMETHASONE 4 MG PO TABS
2.0000 mg | ORAL_TABLET | Freq: Two times a day (BID) | ORAL | Status: DC
  Administered 2016-12-10: 21:00:00 2 mg via ORAL
  Filled 2016-12-10: qty 1

## 2016-12-10 MED ORDER — CLOPIDOGREL BISULFATE 75 MG PO TABS
75.0000 mg | ORAL_TABLET | Freq: Every day | ORAL | Status: DC
  Administered 2016-12-10 – 2016-12-11 (×2): 75 mg via ORAL
  Filled 2016-12-10 (×2): qty 1

## 2016-12-10 MED ORDER — PROPOFOL 10 MG/ML IV BOLUS
INTRAVENOUS | Status: DC | PRN
  Administered 2016-12-10: 60 mg via INTRAVENOUS

## 2016-12-10 NOTE — Op Note (Signed)
[facility] Patient Name: Sarah Horne Procedure Date: 12/10/2016 11:48 AM MRN: [id number] Account #: [id number] Date of Birth: 1956/06/17 Admit Type: Inpatient Age: 61 Room: [facility] ENDO ROOM 1 Gender: Female Note Status: Finalized Procedure:            Colonoscopy Indications:          Hematochezia Providers:            Lucilla Lame MD, MD Referring MD:         No Local Md, MD (Referring MD) Medicines:            Propofol per Anesthesia Complications:        No immediate complications. Procedure:            Pre-Anesthesia Assessment:                       - Prior to the procedure, a History and Physical was                        performed, and patient medications and allergies were                        reviewed. The patient's tolerance of previous                        anesthesia was also reviewed. The risks and benefits of                        the procedure and the sedation options and risks were                        discussed with the patient. All questions were                        answered, and informed consent was obtained. Prior                        Anticoagulants: The patient has taken no previous                        anticoagulant or antiplatelet agents. ASA Grade                        Assessment: III - A patient with severe systemic                        disease. After reviewing the risks and benefits, the                        patient was deemed in satisfactory condition to undergo                        the procedure.                       After obtaining informed consent, the colonoscope was                        passed under direct vision. Throughout the procedure,  the patient's blood pressure, pulse, and oxygen                        saturations were monitored continuously. The Olympus                        CF-H180AL colonoscope ( S#: [id number] ) was introduced                        through  the anus and advanced to the the cecum,                        identified by appendiceal orifice and ileocecal valve.                        The colonoscopy was performed without difficulty. The                        patient tolerated the procedure well. The quality of                        the bowel preparation was good. Findings:      The perianal and digital rectal examinations were normal.      A continuous area of nonbleeding ulcerated mucosa with stigmata of       recent bleeding was present in the distal sigmoid colon. For hemostasis,       two hemostatic clips were successfully placed (MR conditional). There       was no bleeding at the end of the procedure.      A continuous area of bleeding ulcerated mucosa with stigmata of recent       bleeding was present in the rectum. For hemostasis, two hemostatic clips       were successfully placed (MR conditional). There was no bleeding at the       end of the procedure.      Non-bleeding internal hemorrhoids were found during retroflexion. The       hemorrhoids were Grade II (internal hemorrhoids that prolapse but reduce       spontaneously). Impression:           - Mucosal ulceration. Clips (MR conditional) were                        placed.                       - Mucosal ulceration. Clips (MR conditional) were                        placed.                       - Non-bleeding internal hemorrhoids.                       - No specimens collected. Recommendation:       - Return patient to hospital ward for ongoing care. Procedure Code(s):    --- Professional ---                       [phone], Colonoscopy, flexible; with control of bleeding,  any method Diagnosis Code(s):    --- Professional ---                       K92.1, Melena (includes Hematochezia)                       K63.3, Ulcer of intestine CPT copyright 2016 American Medical Association. All rights reserved. The codes documented in this report are  preliminary and upon coder review may  be revised to meet current compliance requirements. Lucilla Lame MD, MD 12/10/2016 12:13:03 PM This report has been signed electronically. Number of Addenda: 0 Note Initiated On: 12/10/2016 11:48 AM Scope Withdrawal Time: 0 hours 10 minutes 34 seconds  Total Procedure Duration: 0 hours 16 minutes 32 seconds       [facility]

## 2016-12-10 NOTE — Transfer of Care (Signed)
Immediate Anesthesia Transfer of Care Note  Patient: Sarah Horne  Procedure(s) Performed: Procedure(s): COLONOSCOPY WITH PROPOFOL (N/A)  Patient Location: PACU, Short Stay and Endoscopy Unit  Anesthesia Type:General  Level of Consciousness: sedated  Airway & Oxygen Therapy: Patient Spontanous Breathing and Patient connected to nasal cannula oxygen  Post-op Assessment: Report given to RN and Post -op Vital signs reviewed and stable  Post vital signs: Reviewed and stable  Last Vitals:  Vitals:   12/09/16 2042 12/10/16 0502  BP: 134/76 128/73  Pulse: 82 71  Resp: 18 20  Temp: 36.5 C 36.6 C    Last Pain:  Vitals:   12/10/16 0502  TempSrc: Oral  PainSc:          Complications: No apparent anesthesia complications

## 2016-12-10 NOTE — Progress Notes (Signed)
Sarah Horne   DOB:[birth year]   IR#:[id number]    Subjective: Patient is resting in bed; accompanied by her son. She is resting comfortably. Denies any more bloody stools. Awaiting colonoscopy tomorrow AM.  Objective:  Vitals:   12/11/16 0103 12/11/16 0415  BP: 117/63 (!) 119/59  Pulse: 75 75  Resp:  20  Temp:  98.6 F (37 C)     Intake/Output Summary (Last 24 hours) at 12/11/16 0801 Last data filed at 12/11/16 0431  Gross per 24 hour  Intake              240 ml  Output              700 ml  Net             -460 ml    GENERAL; sitting in the chair; no distress and comfortable. Accompanied by her son.  EYES: Positive for pallor. OROPHARYNX: no thrush or ulceration. NECK: supple, no masses felt LYMPH:  no palpable lymphadenopathy in the cervical, axillary or inguinal regions LUNGS: decreased breath sounds to auscultation at bases and  No wheeze or crackles HEART/CVS: regular rate & rhythm and no murmurs; No lower extremity edema; pedal pulses felt bilaterally.  ABDOMEN: abdomen soft, tender  on deep palpation. and normal bowel sounds Musculoskeletal:no cyanosis of digits and no clubbing  PSYCH: alert & oriented x 3 with fluent speech NEURO: no focal motor/sensory deficits SKIN:  no rashes or significant lesions;    Labs:  Lab Results  Component Value Date   WBC 57.3 (HH) 12/09/2016   HGB 10.6 (L) 12/09/2016   HCT 31.3 (L) 12/09/2016   MCV 83.7 12/09/2016   PLT 113 (L) 12/09/2016   NEUTROABS 53.1 (H) 12/08/2016    Lab Results  Component Value Date   NA 132 (L) 12/08/2016   K 3.9 12/08/2016   CL 106 12/08/2016   CO2 22 12/08/2016    Studies:  No results found.  Assessment & Plan:   # 61 year old female patient with metastatic breast cancer- currently admitted to the hospital for bilateral small vessel ischemia of the lower extremities- on anti-coagulation; noted to have GI bleed  # GI bleed- leading to acute anemia hemorrhagic shock-nuclear medicine scan  negative for bleeding. EGD showed non-bleeding gastric ulcer; awaiting colonoscopy tomorrow. Currently off anticoagulation.  #  Bilateral lower extremity small vessel ischemia-currently off anticoagulation because of bleeding. ? Related to chemotherapy vs other causes After restarting aspirin once patient is medically stable.  # Metastatic breast cancer- with significant liver disease- status post Cytoxan and Taxotere cycle #1 on 3/1; patient due for cycle #2 on 3/22.   #/ metastatic disease to the brain status post radiation. I reviewed with tapering the dose of steroids.   # Leukocytosis- 50,000 per predominant neutrophilia; likely secondary to Neulasta/steroids. Infection unlikely.  # Above plan of care was discussed with the patient's son by the bedside.    Mancel Parsons MD.

## 2016-12-10 NOTE — Anesthesia Preprocedure Evaluation (Signed)
Anesthesia Evaluation    History of Anesthesia Complications Negative for: history of anesthetic complications  Airway Mallampati: II  TM Distance: >3 FB Neck ROM: Full    Dental  (+) Poor Dentition   Pulmonary neg sleep apnea, neg COPD, Current Smoker,    breath sounds clear to auscultation- rhonchi (-) wheezing      Cardiovascular hypertension, Pt. on medications (-) angina+ Past MI  (-) Cardiac Stents and (-) CABG  Rhythm:Regular Rate:Normal - Systolic murmurs and - Diastolic murmurs    Neuro/Psych negative psych ROS   GI/Hepatic Neg liver ROS, PUD, GIB    Endo/Other  negative endocrine ROSneg diabetes  Renal/GU negative Renal ROS     Musculoskeletal negative musculoskeletal ROS (+)   Abdominal (+) - obese,   Peds  Hematology  (+) anemia ,   Anesthesia Other Findings Past Medical History: 2016: Breast cancer ([place])     Comment: Left-radiation 2016: Cancer ([place])     Comment: Stage II, ER positive, PR positive, HER-2/neu               overexpressing of the left breast. No date: Chemotherapy-induced peripheral neuropathy ([facility]* 1998: Heart attack No date: Hyperlipidemia No date: Hypertension No date: Neuromuscular disorder ([facility])     Comment: neuropathy No date: Neuropathy ([facility])   Reproductive/Obstetrics                             Anesthesia Physical  Anesthesia Plan  ASA: III  Anesthesia Plan: General   Post-op Pain Management:    Induction: Intravenous  Airway Management Planned: Natural Airway  Additional Equipment:   Intra-op Plan:   Post-operative Plan:   Informed Consent: I have reviewed the patients History and Physical, chart, labs and discussed the procedure including the risks, benefits and alternatives for the proposed anesthesia with the patient or authorized representative who has indicated his/her understanding and acceptance.   Dental advisory  given  Plan Discussed with: CRNA and Anesthesiologist  Anesthesia Plan Comments:         Anesthesia Quick Evaluation

## 2016-12-10 NOTE — Anesthesia Post-op Follow-up Note (Cosign Needed)
Anesthesia QCDR form completed.        

## 2016-12-10 NOTE — Progress Notes (Signed)
Sarah Horne at [place] NAME: Sarah Horne    MR#:  [id number]  DATE OF BIRTH:  [birth year]  SUBJECTIVE:  Doing well. No more bleeding. Just got back from colonoscopy  REVIEW OF SYSTEMS:   Review of Systems  Constitutional: Negative for chills, fever and weight loss.  HENT: Negative for ear discharge, ear pain and nosebleeds.   Eyes: Negative for blurred vision, pain and discharge.  Respiratory: Negative for sputum production, shortness of breath, wheezing and stridor.   Cardiovascular: Negative for chest pain, palpitations, orthopnea and PND.  Gastrointestinal: Positive for melena. Negative for abdominal pain, diarrhea, nausea and vomiting.  Genitourinary: Negative for frequency and urgency.  Musculoskeletal: Negative for back pain and joint pain.  Neurological: Positive for weakness. Negative for sensory change, speech change and focal weakness.  Psychiatric/Behavioral: Negative for depression and hallucinations. The patient is not nervous/anxious.    Tolerating Diet:CLD Tolerating PT: rec rehab  DRUG ALLERGIES:   Allergies  Allergen Reactions  . No Known Allergies     VITALS:  Blood pressure 126/66, pulse 65, temperature 98.6 F (37 C), temperature source Oral, resp. rate 16, height '5\' 6"'$  (1.676 m), weight 80.2 kg (176 lb 11.2 oz), SpO2 100 %.  PHYSICAL EXAMINATION:   Physical Exam   GENERAL:  61 y.o.-year-old patient lying in the bed with no acute distress.  EYES: Pupils equal, round, reactive to light and accommodation. No scleral icterus. Extraocular muscles intact. Pallor+ HEENT: Head atraumatic, normocephalic. Oropharynx and nasopharynx clear.  NECK:  Supple, no jugular venous distention. No thyroid enlargement, no tenderness.  LUNGS: Normal breath sounds bilaterally, no wheezing, rales, rhonchi. No use of accessory muscles of respiration.  CARDIOVASCULAR: S1, S2 normal. No murmurs, rubs, or gallops.  ABDOMEN: Soft,  nontender, nondistended. Bowel sounds present. No organomegaly or mass.  EXTREMITIES: No cyanosis, clubbing or edema b/l.    NEUROLOGIC: Cranial nerves II through XII are intact. No focal Motor or sensory deficits b/l.   PSYCHIATRIC:  patient is alert and oriented x 3.  SKIN: No obvious rash, lesion, or ulcer.   LABORATORY PANEL:  CBC  Recent Labs Lab 12/09/16 0406  WBC 57.3*  HGB 10.6*  HCT 31.3*  PLT 113*    Chemistries   Recent Labs Lab 12/04/16 1629 12/08/16 0606  NA 139 132*  K 5.1 3.9  CL 105 106  CO2 28 22  GLUCOSE 140* 96  BUN 41* 30*  CREATININE 0.62 0.40*  CALCIUM 8.0* 6.3*  AST 219*  --   ALT 474*  --   ALKPHOS 352*  --   BILITOT 2.1*  --    Cardiac Enzymes No results for input(s): TROPONINI in the last 168 hours. RADIOLOGY:  No results found. ASSESSMENT AND PLAN:  Sarah Horne a 61 y.o. femalewith a known history of Metastatic breast cancer was receiving chemotherapy at the cancer center was diagnosed with neuropathy bilateral lower extremities suspected due to cancer chemotherapy side effect comes to the emergency room from [place] after she was noted to have hold her feet during her visit of the cancer center. Patient did not have any palpable pulses in both lower extremities.  1. Hypovolemic shock due to GI bleed upper due PUG (EGD showed gastritis and nonbleeding peptic ulcer)And ulcers in the rectum and sigmoid area -14.4---9.9---2 units BP and 1 unit plt---11.2--11.7--10.7 -IV PPI BID -s/p EDG on 3/12 showed gastric ulcers, nonbleeding and gastritis. colonoscopy showed ulcers in the rectum and sigmoid area  status post clipping -NM GI scan negative- -per Dr. Vicente Males okay to start aspirin and Plavix  2. Bilateral lower extremity cyanosis and pallor with cold feet and only dopplerable pulses of present suspicion for peripheral arterial disease given history of tobacco abuse -IV heparin drip d/ced now (got only for 24 hours) -Resume  aspirin and Plavix per GI recommendation -pt has good strong palpable pulses now -Patient was seen by vascular surgery Dr. Lucky Cowboy. No vascular surgical intervention needed at this time  3. Metastatic breast cancer. Currently undergoing chemotherapy -Patient has history of peripheral neuropathy secondary side effects from chemotherapy -weaning po decadron due to ulcers (d/w Dr Rogue Bussing)  4. Tobacco abuse counseled more than 4 minutes for smoking cessation  5. Hyperlipidemia continue statins  6. Hypertension held due to hypotension from GI bleed Was on Coreg  7. Leucocytosis appears reactive from po steroids (taking for solitary brain mets) and neulasta -no s/o infection  CSW for rehab placement Discussed at length with son. If everything remains stable patient will go to rehabilitation tomorrow Case discussed with Care Management/Social Worker. Management plans discussed with the patient, family and they are in agreement.  CODE STATUS: full  DVT Prophylaxis: SCD  TOTAL TIME TAKING CARE OF THIS PATIENT: 40 minutes.  >50% time spent on counselling and coordination of care  POSSIBLE D/C IN 1 DAYS, DEPENDING ON CLINICAL CONDITION.  Note: This dictation was prepared with Dragon dictation along with smaller phrase technology. Any transcriptional errors that result from this process are unintentional.  Anthone Prieur M.D on 12/10/2016 at 3:45 PM  Between 7am to 6pm - Pager - [phone]  After 6pm go to [email or web address] - password [email or web address] [facility]  Office  [phone]  CC: Primary care physician; Keith Rake, MD

## 2016-12-10 NOTE — Anesthesia Postprocedure Evaluation (Signed)
Anesthesia Post Note  Patient: Sarah Horne  Procedure(s) Performed: Procedure(s) (LRB): COLONOSCOPY WITH PROPOFOL (N/A)  Patient location during evaluation: Endoscopy Anesthesia Type: General Level of consciousness: awake and alert Pain management: pain level controlled Vital Signs Assessment: post-procedure vital signs reviewed and stable Respiratory status: spontaneous breathing, nonlabored ventilation, respiratory function stable and patient connected to nasal cannula oxygen Cardiovascular status: blood pressure returned to baseline and stable Postop Assessment: no signs of nausea or vomiting Anesthetic complications: no     Last Vitals:  Vitals:   12/10/16 1247 12/10/16 1311  BP: 120/64 126/66  Pulse: 69 65  Resp: 18 16  Temp:  37 C    Last Pain:  Vitals:   12/10/16 1311  TempSrc: Oral  PainSc:                  Martha Clan

## 2016-12-10 NOTE — Progress Notes (Signed)
Patient returned from endoscopy.  Patient denies pain at this time.  Vital signs are stable.

## 2016-12-10 NOTE — Evaluation (Signed)
Physical Therapy Evaluation Patient Details Name: Sarah Horne MRN: [id number] DOB: 1955-12-19 Today's Date: 12/10/2016   History of Present Illness  Pt is a 61 y.o. female presenting from [place] center with cold painful feet.  Pt admitted with B LE cyanosis and pallor.  Pt with hypotensive crisis, acute GI bleed, and transferred to CCU 12/07/16.  Pt s/p colonoscopy 12/10/16 and now on general medical floor.  PMH includes metastatic breast CA to brain, bone, and liver, chemo induced neutropenia, and peripheral neuropathy.  Clinical Impression  Pt seen for PT re-evaluation.  Prior to hospital admission, pt was independent with functional mobility.  Pt lives alone in 1 level home with 3 steps to enter.  Currently pt is max assist with bed mobility and max assist to stand with RW; pt able to stand and march in place with RW and mod assist x1; pt able to perform standing marching x10 reps B LE's before fatiguing and needing to sit down on edge of bed to rest.  Pt would benefit from skilled PT to address noted impairments and functional limitations.  Recommend pt discharge to STR when medically appropriate.     Follow Up Recommendations SNF    Equipment Recommendations  Rolling walker with 5" wheels    Recommendations for Other Services OT consult     Precautions / Restrictions Precautions Precautions: Fall Precaution Comments: Metastatic disease to bones and brain; port-a-cath Restrictions Weight Bearing Restrictions: No      Mobility  Bed Mobility Overal bed mobility: Needs Assistance Bed Mobility: Supine to Sit;Sit to Supine     Supine to sit: Max assist;HOB elevated Sit to supine: Max assist;HOB elevated   General bed mobility comments: assist for B LE's and trunk supine to/from sit; 2 assist to boost pt up in bed  Transfers Overall transfer level: Needs assistance Equipment used: Rolling walker (2 wheeled) Transfers: Sit to/from Stand Sit to Stand: Max assist          General transfer comment: pt unable to stand 1st trial with maximum assist of therapist; with cueing able to stand with max assist 2nd attempt; vc's for hand and feet placement required  Ambulation/Gait Ambulation/Gait assistance: Mod assist   Assistive device: Rolling walker (2 wheeled)   Gait velocity: decreased   General Gait Details: pt able to march in place x10 reps B LE's before fatiguing and needing to sit back down again  Stairs            Wheelchair Mobility    Modified Rankin (Stroke Patients Only)       Balance Overall balance assessment: Needs assistance Sitting-balance support: Bilateral upper extremity supported;Feet supported Sitting balance-Leahy Scale: Fair Sitting balance - Comments: static sitting   Standing balance support: Bilateral upper extremity supported (on RW) Standing balance-Leahy Scale: Poor Standing balance comment: posterior lean initially with standing but with assist able to correct and shift weight forward (pt then CGA)                             Pertinent Vitals/Pain Pain Assessment: No/denies pain  Vitals (HR and O2 on room air) stable and WFL throughout treatment session.    Home Living Family/patient expects to be discharged to:: Private residence Living Arrangements: Alone   Type of Home: House Home Access: Stairs to enter Entrance Stairs-Rails: None Entrance Stairs-Number of Steps: 3 Home Layout: One level Home Equipment: None      Prior Function Level of  Independence: Independent         Comments: Pt reports 3 falls in last 6 months (1 at home; 1 in community; and 1 in hospital).     Hand Dominance        Extremity/Trunk Assessment   Upper Extremity Assessment Upper Extremity Assessment: Generalized weakness (Deferred MMT d/t metastatic disease to bones)    Lower Extremity Assessment Lower Extremity Assessment: Generalized weakness (Deferred MMT d/t metastatic disease to bones)        Communication   Communication:  (Soft spoken)  Cognition Arousal/Alertness: Awake/alert Behavior During Therapy: Flat affect (but did smile end of session after standing and marching in place (pt appeared excited about her progress)) Overall Cognitive Status: Within Functional Limits for tasks assessed                      General Comments General comments (skin integrity, edema, etc.): Pt's son present during session.  Nursing cleared pt for participation in physical therapy.  Pt agreeable to PT session.    Exercises  Transfer/functional mobility training   Assessment/Plan    PT Assessment Patient needs continued PT services  PT Problem List Decreased strength;Decreased activity tolerance;Decreased balance;Decreased mobility;Decreased knowledge of use of DME;Decreased knowledge of precautions       PT Treatment Interventions DME instruction;Gait training;Stair training;Functional mobility training;Therapeutic activities;Therapeutic exercise;Balance training;Patient/family education    PT Goals (Current goals can be found in the Care Plan section)  Acute Rehab PT Goals Patient Stated Goal: to get stronger PT Goal Formulation: With patient Time For Goal Achievement: 12/24/16 Potential to Achieve Goals: Fair    Frequency Min 2X/week   Barriers to discharge Decreased caregiver support      Co-evaluation               End of Session Equipment Utilized During Treatment: Gait belt Activity Tolerance: Patient limited by fatigue Patient left: in bed;with call bell/phone within reach;with bed alarm set;with family/visitor present Nurse Communication: Mobility status;Precautions PT Visit Diagnosis: Difficulty in walking, not elsewhere classified (R26.2);Muscle weakness (generalized) (M62.81);History of falling (Z91.81)         Time: 3818-2993 PT Time Calculation (min) (ACUTE ONLY): 21 min   Charges:   PT Evaluation $PT Re-evaluation: 1 Procedure PT  Treatments $Therapeutic Activity: 8-22 mins   PT G CodesLeitha Bleak, PT 12/10/16, 3:36 PM [phone]

## 2016-12-10 NOTE — Progress Notes (Signed)
Patient taken to endoscopy by Roselyn Reef, orderly via bed.  Telephone report given to endoscopy nurse.

## 2016-12-11 ENCOUNTER — Encounter: Payer: Self-pay | Admitting: Gastroenterology

## 2016-12-11 DIAGNOSIS — I429 Cardiomyopathy, unspecified: Secondary | ICD-10-CM | POA: Diagnosis not present

## 2016-12-11 DIAGNOSIS — Z743 Need for continuous supervision: Secondary | ICD-10-CM | POA: Diagnosis not present

## 2016-12-11 DIAGNOSIS — B370 Candidal stomatitis: Secondary | ICD-10-CM | POA: Diagnosis not present

## 2016-12-11 DIAGNOSIS — K922 Gastrointestinal hemorrhage, unspecified: Secondary | ICD-10-CM | POA: Diagnosis not present

## 2016-12-11 DIAGNOSIS — Z170 Estrogen receptor positive status [ER+]: Secondary | ICD-10-CM | POA: Diagnosis not present

## 2016-12-11 DIAGNOSIS — Z853 Personal history of malignant neoplasm of breast: Secondary | ICD-10-CM | POA: Diagnosis not present

## 2016-12-11 DIAGNOSIS — F329 Major depressive disorder, single episode, unspecified: Secondary | ICD-10-CM | POA: Diagnosis not present

## 2016-12-11 DIAGNOSIS — Z5111 Encounter for antineoplastic chemotherapy: Secondary | ICD-10-CM | POA: Diagnosis not present

## 2016-12-11 DIAGNOSIS — I739 Peripheral vascular disease, unspecified: Secondary | ICD-10-CM | POA: Diagnosis not present

## 2016-12-11 DIAGNOSIS — I252 Old myocardial infarction: Secondary | ICD-10-CM | POA: Diagnosis not present

## 2016-12-11 DIAGNOSIS — Z7902 Long term (current) use of antithrombotics/antiplatelets: Secondary | ICD-10-CM | POA: Diagnosis not present

## 2016-12-11 DIAGNOSIS — R1310 Dysphagia, unspecified: Secondary | ICD-10-CM | POA: Diagnosis not present

## 2016-12-11 DIAGNOSIS — D500 Iron deficiency anemia secondary to blood loss (chronic): Secondary | ICD-10-CM | POA: Diagnosis not present

## 2016-12-11 DIAGNOSIS — F17210 Nicotine dependence, cigarettes, uncomplicated: Secondary | ICD-10-CM | POA: Diagnosis not present

## 2016-12-11 DIAGNOSIS — Z79811 Long term (current) use of aromatase inhibitors: Secondary | ICD-10-CM | POA: Diagnosis not present

## 2016-12-11 DIAGNOSIS — R000 Tachycardia, unspecified: Secondary | ICD-10-CM | POA: Diagnosis present

## 2016-12-11 DIAGNOSIS — C50412 Malignant neoplasm of upper-outer quadrant of left female breast: Secondary | ICD-10-CM | POA: Diagnosis not present

## 2016-12-11 DIAGNOSIS — K259 Gastric ulcer, unspecified as acute or chronic, without hemorrhage or perforation: Secondary | ICD-10-CM | POA: Diagnosis not present

## 2016-12-11 DIAGNOSIS — R262 Difficulty in walking, not elsewhere classified: Secondary | ICD-10-CM | POA: Diagnosis not present

## 2016-12-11 DIAGNOSIS — M6281 Muscle weakness (generalized): Secondary | ICD-10-CM | POA: Diagnosis not present

## 2016-12-11 DIAGNOSIS — R7989 Other specified abnormal findings of blood chemistry: Secondary | ICD-10-CM | POA: Diagnosis not present

## 2016-12-11 DIAGNOSIS — R040 Epistaxis: Secondary | ICD-10-CM | POA: Diagnosis not present

## 2016-12-11 DIAGNOSIS — I471 Supraventricular tachycardia: Secondary | ICD-10-CM | POA: Diagnosis not present

## 2016-12-11 DIAGNOSIS — I951 Orthostatic hypotension: Secondary | ICD-10-CM | POA: Diagnosis not present

## 2016-12-11 DIAGNOSIS — D709 Neutropenia, unspecified: Secondary | ICD-10-CM | POA: Diagnosis not present

## 2016-12-11 DIAGNOSIS — E785 Hyperlipidemia, unspecified: Secondary | ICD-10-CM | POA: Diagnosis not present

## 2016-12-11 DIAGNOSIS — D72829 Elevated white blood cell count, unspecified: Secondary | ICD-10-CM | POA: Diagnosis not present

## 2016-12-11 DIAGNOSIS — Z803 Family history of malignant neoplasm of breast: Secondary | ICD-10-CM | POA: Diagnosis not present

## 2016-12-11 DIAGNOSIS — N189 Chronic kidney disease, unspecified: Secondary | ICD-10-CM | POA: Diagnosis not present

## 2016-12-11 DIAGNOSIS — Z923 Personal history of irradiation: Secondary | ICD-10-CM | POA: Diagnosis not present

## 2016-12-11 DIAGNOSIS — I80209 Phlebitis and thrombophlebitis of unspecified deep vessels of unspecified lower extremity: Secondary | ICD-10-CM | POA: Diagnosis not present

## 2016-12-11 DIAGNOSIS — T451X5A Adverse effect of antineoplastic and immunosuppressive drugs, initial encounter: Secondary | ICD-10-CM | POA: Diagnosis not present

## 2016-12-11 DIAGNOSIS — G620 Drug-induced polyneuropathy: Secondary | ICD-10-CM | POA: Diagnosis not present

## 2016-12-11 DIAGNOSIS — K279 Peptic ulcer, site unspecified, unspecified as acute or chronic, without hemorrhage or perforation: Secondary | ICD-10-CM | POA: Diagnosis not present

## 2016-12-11 DIAGNOSIS — G629 Polyneuropathy, unspecified: Secondary | ICD-10-CM | POA: Diagnosis not present

## 2016-12-11 DIAGNOSIS — M8580 Other specified disorders of bone density and structure, unspecified site: Secondary | ICD-10-CM | POA: Diagnosis not present

## 2016-12-11 DIAGNOSIS — I10 Essential (primary) hypertension: Secondary | ICD-10-CM | POA: Diagnosis not present

## 2016-12-11 DIAGNOSIS — Z7982 Long term (current) use of aspirin: Secondary | ICD-10-CM | POA: Diagnosis not present

## 2016-12-11 DIAGNOSIS — E860 Dehydration: Secondary | ICD-10-CM | POA: Diagnosis not present

## 2016-12-11 DIAGNOSIS — Z79899 Other long term (current) drug therapy: Secondary | ICD-10-CM | POA: Diagnosis not present

## 2016-12-11 DIAGNOSIS — C7931 Secondary malignant neoplasm of brain: Secondary | ICD-10-CM | POA: Diagnosis not present

## 2016-12-11 DIAGNOSIS — M7989 Other specified soft tissue disorders: Secondary | ICD-10-CM | POA: Diagnosis not present

## 2016-12-11 DIAGNOSIS — R531 Weakness: Secondary | ICD-10-CM | POA: Diagnosis not present

## 2016-12-11 DIAGNOSIS — I998 Other disorder of circulatory system: Secondary | ICD-10-CM | POA: Diagnosis not present

## 2016-12-11 DIAGNOSIS — R571 Hypovolemic shock: Secondary | ICD-10-CM | POA: Diagnosis not present

## 2016-12-11 LAB — BPAM RBC
BLOOD PRODUCT EXPIRATION DATE: 201803292359
Blood Product Expiration Date: 201803292359
Blood Product Expiration Date: 201803292359
Blood Product Expiration Date: 201803302359
ISSUE DATE / TIME: 201803121233
ISSUE DATE / TIME: 201803121829
UNIT TYPE AND RH: 6200
UNIT TYPE AND RH: 6200
UNIT TYPE AND RH: 6200
Unit Type and Rh: 6200

## 2016-12-11 LAB — TYPE AND SCREEN
ABO/RH(D): A POS
ANTIBODY SCREEN: NEGATIVE
UNIT DIVISION: 0
UNIT DIVISION: 0
Unit division: 0
Unit division: 0

## 2016-12-11 MED ORDER — SENNOSIDES-DOCUSATE SODIUM 8.6-50 MG PO TABS: 1.0000 | ORAL_TABLET | Freq: Two times a day (BID) | ORAL | 0 refills | Status: DC

## 2016-12-11 MED ORDER — DEXAMETHASONE 0.5 MG PO TABS
1.0000 mg | ORAL_TABLET | Freq: Two times a day (BID) | ORAL | Status: DC
Start: 2016-12-11 — End: 2016-12-11
  Administered 2016-12-11: 1 mg via ORAL
  Filled 2016-12-11: qty 2

## 2016-12-11 MED ORDER — PANTOPRAZOLE SODIUM 40 MG PO TBEC
40.0000 mg | DELAYED_RELEASE_TABLET | Freq: Two times a day (BID) | ORAL | Status: DC
  Administered 2016-12-11: 40 mg via ORAL
  Filled 2016-12-11: qty 1

## 2016-12-11 MED ORDER — PANTOPRAZOLE SODIUM 40 MG PO TBEC: 40.0000 mg | DELAYED_RELEASE_TABLET | Freq: Two times a day (BID) | ORAL | 2 refills | Status: DC

## 2016-12-11 MED ORDER — DEXAMETHASONE 1 MG PO TABS: 1.0000 mg | ORAL_TABLET | Freq: Two times a day (BID) | ORAL | 0 refills | Status: DC

## 2016-12-11 MED ORDER — SENNOSIDES-DOCUSATE SODIUM 8.6-50 MG PO TABS
1.0000 | ORAL_TABLET | Freq: Two times a day (BID) | ORAL | Status: DC
Start: 2016-12-11 — End: 2016-12-11
  Administered 2016-12-11: 1 via ORAL
  Filled 2016-12-11: qty 1

## 2016-12-11 MED ORDER — DEXAMETHASONE 4 MG PO TABS: 2.0000 mg | ORAL_TABLET | Freq: Every day | ORAL | Status: DC

## 2016-12-11 NOTE — Discharge Summary (Signed)
[place] at [place] NAME: Sarah Horne    MR#:  [id number]  DATE OF BIRTH:  December 10, 1955  DATE OF ADMISSION:  12/04/2016 ADMITTING PHYSICIAN: Fritzi Mandes, MD  DATE OF DISCHARGE: 12/11/16  PRIMARY CARE PHYSICIAN: Keith Rake, MD    ADMISSION DIAGNOSIS:  Claudication ([facility]) [I73.9]  DISCHARGE DIAGNOSIS:  Peripheral arterial dz with h/o tobacco abuse Hypovolemic shock due to GI bleed form PUD and Sigmoid/rectal ulcers Metastatic Breast cancer s/p chemo and Radiation Leucocytosis (persistent)  SECONDARY DIAGNOSIS:   Past Medical History:  Diagnosis Date  . Breast cancer ([facility]) 2016   Left-radiation  . Cancer ([facility]) 2016   Stage II, ER positive, PR positive, HER-2/neu overexpressing of the left breast.  . Chemotherapy-induced peripheral neuropathy ([facility])   . Heart attack 1998  . Hyperlipidemia   . Hypertension   . Neuromuscular disorder (HCC)    neuropathy  . Neuropathy [facility])     HOSPITAL COURSE:   Sarah Horne a 61 y.o. femalewith a known history of Metastatic breast cancer was receiving chemotherapy at the cancer center was diagnosed with neuropathy bilateral lower extremities suspected due to cancer chemotherapy side effect comes to the emergency room from [place] after she was noted to have hold her feet during her visit of the cancer center. Patient did not have any palpable pulses in both lower extremities.  1. Hypovolemic shock due to GI bleed upper due PUG (EGD showed gastritis and nonbleeding peptic ulcer)And ulcers in the rectum and sigmoid area -14.4---9.9---2 units BP and 1 unit plt---11.2--11.7--10.7--10.6 -IV PPI BID--change to oral -s/p EDG on 3/12 showed gastric ulcers, nonbleeding and gastritis. -s/p colonoscopy 12/11/16 showed ulcers in the rectum and sigmoid area status post clipping -NM GI scan negative -per Dr. Vicente Males okay to start aspirin and Plavix  2. Bilateral lower extremity cyanosis and pallor  with cold feet and only dopplerable pulses of present suspicion for peripheral arterial disease given history of tobacco abuse -IV heparin drip d/ced now (got only for 24 hours) -Resume aspirin and Plavix per GI recommendation -pt has good strong palpable pulses now -Patient was seen by vascular surgery Dr. Lucky Cowboy. No vascular surgical intervention needed at this time  3. Metastatic breast cancer. Currently undergoing chemotherapy -Patient has history of peripheral neuropathy secondary side effects from chemotherapy -weaning po decadron due to ulcers (d/w Dr Rogue Bussing)  4. Tobacco abuse counseled more than 4 minutes for smoking cessation  5. Hyperlipidemia continue statins  6. Hypertension held due to hypotension from GI bleed Was on Coreg  7. Leucocytosis appears reactive from po steroids (taking for solitary brain mets) and neulasta -no s/o infection  CSW for rehab placement Discussed at length with son.  To rehab today CONSULTS OBTAINED:  Treatment Team:  Lucilla Lame, MD Katha Cabal, MD  DRUG ALLERGIES:   Allergies  Allergen Reactions  . No Known Allergies     DISCHARGE MEDICATIONS:   Current Discharge Medication List    START taking these medications   Details  clopidogrel (PLAVIX) 75 MG tablet Take 1 tablet (75 mg total) by mouth daily. Qty: 30 tablet, Refills: 1    feeding supplement, ENSURE ENLIVE, (ENSURE ENLIVE) LIQD Take 237 mLs by mouth 2 (two) times daily between meals. Qty: 237 mL, Refills: 12    pantoprazole (PROTONIX) 40 MG tablet Take 1 tablet (40 mg total) by mouth 2 (two) times daily. Qty: 60 tablet, Refills: 2    polyethylene glycol (MIRALAX / GLYCOLAX) packet  Take 17 g by mouth daily. Qty: 14 each, Refills: 0    senna-docusate (SENOKOT-S) 8.6-50 MG tablet Take 1 tablet by mouth 2 (two) times daily. Qty: 60 tablet, Refills: 0      CONTINUE these medications which have CHANGED   Details  dexamethasone (DECADRON) 1 MG tablet Take 1  tablet (1 mg total) by mouth 2 (two) times daily. Take 1 mg bid today (march 16 th) 1 mg bid march 17th 67m qd on  March 18 th and then stop Qty: 5 tablet, Refills: 0   Associated Diagnoses: Carcinoma of upper-outer quadrant of left breast in female, estrogen receptor positive ([employer]; Mass of brain      CONTINUE these medications which have NOT CHANGED   Details  anastrozole (ARIMIDEX) 1 MG tablet TAKE 1 TABLET DAILY Qty: 90 tablet, Refills: 3   Associated Diagnoses: Malignant neoplasm of lower-inner quadrant of left female breast ([facility]; Osteoporosis; Malignant neoplasm of left female breast (HCC)    aspirin 81 MG tablet Take 81 mg by mouth daily.    carvedilol (COREG) 6.25 MG tablet Take 1 tablet (6.25 mg total) by mouth 2 (two) times daily. Qty: 180 tablet, Refills: 0   Associated Diagnoses: Essential hypertension    diphenoxylate-atropine (LOMOTIL) 2.5-0.025 MG tablet Take 1 tablet by mouth 4 (four) times daily as needed for diarrhea or loose stools. Take it along with immodium Qty: 60 tablet, Refills: 0   Associated Diagnoses: Carcinoma of upper-outer quadrant of left breast in female, estrogen receptor positive (HCC)    DULoxetine (CYMBALTA) 60 MG capsule TAKE 1 CAPSULE (60 MG TOTAL) BY MOUTH DAILY. Qty: 30 capsule, Refills: 0    ondansetron (ZOFRAN) 8 MG tablet Take 1 tablet (8 mg total) by mouth every 8 (eight) hours as needed for nausea or vomiting (start 3 days; after chemo). Qty: 40 tablet, Refills: 1    prochlorperazine (COMPAZINE) 10 MG tablet Take 1 tablet (10 mg total) by mouth every 6 (six) hours as needed for nausea or vomiting. Qty: 40 tablet, Refills: 1      STOP taking these medications     nystatin (MYCOSTATIN) 100000 UNIT/ML suspension      potassium chloride SA (K-DUR,KLOR-CON) 20 MEQ tablet         If you experience worsening of your admission symptoms, develop shortness of breath, life threatening emergency, suicidal or homicidal thoughts you must seek  medical attention immediately by calling 911 or calling your MD immediately  if symptoms less severe.  You Must read complete instructions/literature along with all the possible adverse reactions/side effects for all the Medicines you take and that have been prescribed to you. Take any new Medicines after you have completely understood and accept all the possible adverse reactions/side effects.   Please note  You were cared for by a hospitalist during your hospital stay. If you have any questions about your discharge medications or the care you received while you were in the hospital after you are discharged, you can call the unit and asked to speak with the hospitalist on call if the hospitalist that took care of you is not available. Once you are discharged, your primary care physician will handle any further medical issues. Please note that NO REFILLS for any discharge medications will be authorized once you are discharged, as it is imperative that you return to your primary care physician (or establish a relationship with a primary care physician if you do not have one) for your aftercare needs so that they  can reassess your need for medications and monitor your lab values. Today   SUBJECTIVE   Doing well  VITAL SIGNS:  Blood pressure (!) 119/59, pulse 75, temperature 98.6 F (37 C), temperature source Oral, resp. rate 20, height 5' 6"  (1.676 m), weight 80.2 kg (176 lb 11.2 oz), SpO2 99 %.  I/O:   Intake/Output Summary (Last 24 hours) at 12/11/16 0729 Last data filed at 12/11/16 0431  Gross per 24 hour  Intake              240 ml  Output              700 ml  Net             -460 ml    PHYSICAL EXAMINATION:  GENERAL:  61 y.o.-year-old patient lying in the bed with no acute distress.  EYES: Pupils equal, round, reactive to light and accommodation. No scleral icterus. Extraocular muscles intact.  HEENT: Head atraumatic, normocephalic. Oropharynx and nasopharynx clear.  NECK:  Supple,  no jugular venous distention. No thyroid enlargement, no tenderness.  LUNGS: Normal breath sounds bilaterally, no wheezing, rales,rhonchi or crepitation. No use of accessory muscles of respiration.  CARDIOVASCULAR: S1, S2 normal. No murmurs, rubs, or gallops.  ABDOMEN: Soft, non-tender, non-distended. Bowel sounds present. No organomegaly or mass.  EXTREMITIES: No pedal edema, cyanosis, or clubbing.  NEUROLOGIC: Cranial nerves II through XII are intact. Muscle strength 5/5 in all extremities. Sensation intact. Gait not checked.  PSYCHIATRIC: The patient is alert and oriented x 3.  SKIN: No obvious rash, lesion, or ulcer.   DATA REVIEW:   CBC   Recent Labs Lab 12/09/16 0406  WBC 57.3*  HGB 10.6*  HCT 31.3*  PLT 113*    Chemistries   Recent Labs Lab 12/04/16 1629 12/08/16 0606  NA 139 132*  K 5.1 3.9  CL 105 106  CO2 28 22  GLUCOSE 140* 96  BUN 41* 30*  CREATININE 0.62 0.40*  CALCIUM 8.0* 6.3*  AST 219*  --   ALT 474*  --   ALKPHOS 352*  --   BILITOT 2.1*  --     Microbiology Results   Recent Results (from the past 240 hour(s))  MRSA PCR Screening     Status: None   Collection Time: 12/07/16 11:06 AM  Result Value Ref Range Status   MRSA by PCR NEGATIVE NEGATIVE Final    Comment:        The GeneXpert MRSA Assay (FDA approved for NASAL specimens only), is one component of a comprehensive MRSA colonization surveillance program. It is not intended to diagnose MRSA infection nor to guide or monitor treatment for MRSA infections.     RADIOLOGY:  No results found.   Management plans discussed with the patient, family and they are in agreement.  CODE STATUS:     Code Status Orders        Start     Ordered   12/04/16 2032  Full code  Continuous     12/04/16 2031    Code Status History    Date Active Date Inactive Code Status Order ID Comments User Context   11/10/2016  8:59 AM 11/12/2016  4:00 PM Full Code [id number]  Hillary Bow, MD ED     Advance Directive Documentation     Most Recent Value  Type of Advance Directive  Healthcare Power of Attorney, Living will  Pre-existing out of facility DNR order (yellow form or pink MOST form)  -  "  MOST" Form in Place?  -      TOTAL TIME TAKING CARE OF THIS PATIENT: 40 minutes.    Larine Fielding M.D on 12/11/2016 at 7:29 AM  Between 7am to 6pm - Pager - [phone] After 6pm go to [email or web address] - password [email or web address] [facility]  Office  [phone]  CC: Primary care physician; Keith Rake, MD

## 2016-12-11 NOTE — Progress Notes (Signed)
Pt discharged via [facility] to [facility]. Writer called report to accepting RN at [employer] Patient being discharged on room air, port heparin locked and deaccessed, no distress noted. Ammie Dalton, RN

## 2016-12-11 NOTE — Progress Notes (Signed)
CH responded to a PG for AD completion. Pt is awake and alert. [place] secured a notary and two witnesses. CH made a copy of the completed AD and placed in the Pt chart. [place] returned the original to the Pt. CH suggested Pt make additional copies to distribute to family members and MD outside of [facility]. Pt is appreciative and hopes for discharge today.    12/11/16 1100  Clinical Encounter Type  Visited With Patient;Health care provider  Visit Type Initial;Spiritual support  Referral From Nurse  Spiritual Encounters  Spiritual Needs Literature

## 2016-12-11 NOTE — Clinical Social Work Placement (Signed)
   CLINICAL SOCIAL WORK PLACEMENT  NOTE  Date:  12/11/2016  Patient Details  Name: Sarah Horne MRN: [id number] Date of Birth: [birth year]  Clinical Social Work is seeking post-discharge placement for this patient at the [name] level of care (*CSW will initial, date and re-position this form in  chart as items are completed):  Yes   Patient/family provided with [place] Work Department's list of facilities offering this level of care within the geographic area requested by the patient (or if unable, by the patient's family).  Yes   Patient/family informed of their freedom to choose among providers that offer the needed level of care, that participate in Medicare, Medicaid or managed care program needed by the patient, have an available bed and are willing to accept the patient.  Yes   Patient/family informed of [facility]'s ownership interest in [facility] and [facility], as well as of the fact that they are under no obligation to receive care at these facilities.  PASRR submitted to EDS on       PASRR number received on       Existing PASRR number confirmed on 12/05/16     FL2 transmitted to all facilities in geographic area requested by pt/family on 12/05/16     FL2 transmitted to all facilities within larger geographic area on       Patient informed that his/her managed care company has contracts with or will negotiate with certain facilities, including the following:        Yes   Patient/family informed of bed offers received.  Patient chooses bed at [facility]     Physician recommends and patient chooses bed at      Patient to be [facility] to [facility] on 12/11/16.  Patient to be transferred to facility by [facility]     Patient family notified on 12/11/16 of transfer.  Name of family member notified:  Pt's son     PHYSICIAN       Additional Comment:     _______________________________________________ Darden Dates, LCSW 12/11/2016, 9:48 AM

## 2016-12-11 NOTE — Clinical Social Work Note (Signed)
Pt is ready for discharge today and will go to [facility] [employer] has provided [place]. Facility is ready to admit pt as they have received discharge information. Pt and son are aware and agreeable to discharge plan. RN will call report. [facility] will provide transportation. CSW is signing off as no further needs identified.   Darden Dates, MSW, LCSW  Clinical Social Worker  [phone]

## 2016-12-15 ENCOUNTER — Encounter: Payer: Self-pay | Admitting: *Deleted

## 2016-12-15 ENCOUNTER — Other Ambulatory Visit: Payer: Self-pay | Admitting: Internal Medicine

## 2016-12-15 ENCOUNTER — Ambulatory Visit: Payer: BLUE CROSS/BLUE SHIELD | Admitting: Family Medicine

## 2016-12-15 ENCOUNTER — Telehealth: Payer: Self-pay | Admitting: Internal Medicine

## 2016-12-15 DIAGNOSIS — T451X5A Adverse effect of antineoplastic and immunosuppressive drugs, initial encounter: Secondary | ICD-10-CM | POA: Diagnosis not present

## 2016-12-15 DIAGNOSIS — I429 Cardiomyopathy, unspecified: Secondary | ICD-10-CM | POA: Diagnosis not present

## 2016-12-15 DIAGNOSIS — G620 Drug-induced polyneuropathy: Secondary | ICD-10-CM | POA: Diagnosis not present

## 2016-12-15 DIAGNOSIS — D500 Iron deficiency anemia secondary to blood loss (chronic): Secondary | ICD-10-CM | POA: Diagnosis not present

## 2016-12-15 NOTE — Telephone Encounter (Signed)
msg sent to scheduling team.

## 2016-12-15 NOTE — Progress Notes (Signed)
Per md order- msg sent to scheduling team and prior auth team regarding change of treatment plan. md will change oncology tx to Eribulin.

## 2016-12-15 NOTE — Progress Notes (Signed)
DISCONTINUE OFF PATHWAY REGIMEN - Breast   OFF00004:Docetaxel + Cyclophosphamide (TC):   A cycle is every 21 days:     Docetaxel        Dose Mod: None     Cyclophosphamide        Dose Mod: None  **Always confirm dose/schedule in your pharmacy ordering system**    REASON: Toxicities / Adverse Event PRIOR TREATMENT: Off Pathway: Docetaxel + Cyclophosphamide (TC) TREATMENT RESPONSE: Unable to Evaluate  START OFF PATHWAY REGIMEN - Breast   [id number]:Eribulin 1.4 mg/m2 D1, 8 q21 Days:   A cycle is every 21 days:     Eribulin mesylate   **Always confirm dose/schedule in your pharmacy ordering system**    Patient Characteristics: Metastatic Chemotherapy, HER2/neu Negative/Unknown/Equivocal, ER +, Second Line, Prior Paclitaxel AJCC Stage Grouping: IV Current Disease Status: Distant Metastases AJCC M Stage: X ER Status: Positive (+) AJCC N Stage: X AJCC T Stage: X HER2/neu: Negative (-) PR Status: Positive (+) Line of therapy: Second Line Would you be surprised if this patient died  in the next year? I would be surprised if this patient died in the next year  Intent of Therapy: Non-Curative / Palliative Intent, Discussed with Patient

## 2016-12-15 NOTE — Telephone Encounter (Signed)
Review of literature- vasospasm rare but potential side effect of Taxotere chemotherapy.  In absence of any other acute causes of her bilateral cold extremities- Taxotere chemotherapy is to be considered as a potential cause of the patient's symptoms. I would discontinue Taxotere Cytoxan given her complicated medical history / multiple complications.   # I would recommend starting eribulin- orders are in. Plan to start on 3/22 instead of Tax-Cytoxan.

## 2016-12-17 ENCOUNTER — Inpatient Hospital Stay (HOSPITAL_BASED_OUTPATIENT_CLINIC_OR_DEPARTMENT_OTHER): Payer: BLUE CROSS/BLUE SHIELD | Admitting: Internal Medicine

## 2016-12-17 ENCOUNTER — Inpatient Hospital Stay: Payer: BLUE CROSS/BLUE SHIELD

## 2016-12-17 VITALS — BP 107/69 | HR 83 | Temp 96.7°F | Resp 20 | Ht 66.0 in | Wt 175.6 lb

## 2016-12-17 DIAGNOSIS — D709 Neutropenia, unspecified: Secondary | ICD-10-CM | POA: Diagnosis not present

## 2016-12-17 DIAGNOSIS — B370 Candidal stomatitis: Secondary | ICD-10-CM

## 2016-12-17 DIAGNOSIS — E785 Hyperlipidemia, unspecified: Secondary | ICD-10-CM

## 2016-12-17 DIAGNOSIS — Z79899 Other long term (current) drug therapy: Secondary | ICD-10-CM | POA: Diagnosis not present

## 2016-12-17 DIAGNOSIS — D701 Agranulocytosis secondary to cancer chemotherapy: Secondary | ICD-10-CM

## 2016-12-17 DIAGNOSIS — Z5111 Encounter for antineoplastic chemotherapy: Secondary | ICD-10-CM | POA: Diagnosis not present

## 2016-12-17 DIAGNOSIS — M8580 Other specified disorders of bone density and structure, unspecified site: Secondary | ICD-10-CM

## 2016-12-17 DIAGNOSIS — Z923 Personal history of irradiation: Secondary | ICD-10-CM

## 2016-12-17 DIAGNOSIS — C7931 Secondary malignant neoplasm of brain: Secondary | ICD-10-CM

## 2016-12-17 DIAGNOSIS — F17210 Nicotine dependence, cigarettes, uncomplicated: Secondary | ICD-10-CM

## 2016-12-17 DIAGNOSIS — C50412 Malignant neoplasm of upper-outer quadrant of left female breast: Secondary | ICD-10-CM | POA: Diagnosis not present

## 2016-12-17 DIAGNOSIS — I252 Old myocardial infarction: Secondary | ICD-10-CM | POA: Diagnosis not present

## 2016-12-17 DIAGNOSIS — R531 Weakness: Secondary | ICD-10-CM

## 2016-12-17 DIAGNOSIS — I10 Essential (primary) hypertension: Secondary | ICD-10-CM | POA: Diagnosis not present

## 2016-12-17 DIAGNOSIS — Z7982 Long term (current) use of aspirin: Secondary | ICD-10-CM

## 2016-12-17 DIAGNOSIS — T451X5A Adverse effect of antineoplastic and immunosuppressive drugs, initial encounter: Secondary | ICD-10-CM

## 2016-12-17 DIAGNOSIS — Z170 Estrogen receptor positive status [ER+]: Principal | ICD-10-CM

## 2016-12-17 DIAGNOSIS — R7989 Other specified abnormal findings of blood chemistry: Secondary | ICD-10-CM

## 2016-12-17 DIAGNOSIS — G629 Polyneuropathy, unspecified: Secondary | ICD-10-CM

## 2016-12-17 DIAGNOSIS — R1310 Dysphagia, unspecified: Secondary | ICD-10-CM | POA: Diagnosis not present

## 2016-12-17 DIAGNOSIS — M7989 Other specified soft tissue disorders: Secondary | ICD-10-CM

## 2016-12-17 DIAGNOSIS — Z803 Family history of malignant neoplasm of breast: Secondary | ICD-10-CM

## 2016-12-17 DIAGNOSIS — E860 Dehydration: Secondary | ICD-10-CM | POA: Diagnosis not present

## 2016-12-17 LAB — COMPREHENSIVE METABOLIC PANEL
ALBUMIN: 2.3 g/dL — AB (ref 3.5–5.0)
ALT: 207 U/L — AB (ref 14–54)
ANION GAP: 5 (ref 5–15)
AST: 117 U/L — ABNORMAL HIGH (ref 15–41)
Alkaline Phosphatase: 394 U/L — ABNORMAL HIGH (ref 38–126)
BUN: 12 mg/dL (ref 6–20)
CHLORIDE: 99 mmol/L — AB (ref 101–111)
CO2: 32 mmol/L (ref 22–32)
Calcium: 8.2 mg/dL — ABNORMAL LOW (ref 8.9–10.3)
Creatinine, Ser: 0.52 mg/dL (ref 0.44–1.00)
GFR calc non Af Amer: >60 mL/min (ref >60)
GLUCOSE: 87 mg/dL (ref 65–99)
Potassium: 3.4 mmol/L — ABNORMAL LOW (ref 3.5–5.1)
SODIUM: 136 mmol/L (ref 135–145)
Total Bilirubin: 1.2 mg/dL (ref 0.3–1.2)
Total Protein: 5.7 g/dL — ABNORMAL LOW (ref 6.5–8.1)

## 2016-12-17 LAB — CBC WITH DIFFERENTIAL/PLATELET
BASOS PCT: 0 %
Basophils Absolute: 0 10*3/uL (ref 0–0.1)
EOS ABS: 0 10*3/uL (ref 0–0.7)
Eosinophils Relative: 0 %
HCT: 28.3 % — ABNORMAL LOW (ref 35.0–47.0)
HEMOGLOBIN: 9.6 g/dL — AB (ref 12.0–16.0)
LYMPHS PCT: 8 %
Lymphs Abs: 1.1 10*3/uL (ref 1.0–3.6)
MCH: 29.8 pg (ref 26.0–34.0)
MCHC: 33.8 g/dL (ref 32.0–36.0)
MCV: 88.3 fL (ref 80.0–100.0)
MONO ABS: 0.5 10*3/uL (ref 0.2–0.9)
MONOS PCT: 3 %
NEUTROS ABS: 13.1 10*3/uL — AB (ref 1.4–6.5)
Neutrophils Relative %: 89 %
PLATELETS: 251 10*3/uL (ref 150–440)
RBC: 3.21 MIL/uL — AB (ref 3.80–5.20)
RDW: 20.4 % — ABNORMAL HIGH (ref 11.5–14.5)
WBC: 14.7 10*3/uL — ABNORMAL HIGH (ref 3.6–11.0)

## 2016-12-17 MED ORDER — SODIUM CHLORIDE 0.9 % IV SOLN
1.4000 mg/m2 | Freq: Once | INTRAVENOUS | Status: AC
  Administered 2016-12-17: 2.7 mg via INTRAVENOUS
  Filled 2016-12-17: qty 5.4

## 2016-12-17 MED ORDER — SODIUM CHLORIDE 0.9% FLUSH
10.0000 mL | INTRAVENOUS | Status: DC | PRN
  Administered 2016-12-17: 10 mL via INTRAVENOUS
  Filled 2016-12-17: qty 10

## 2016-12-17 MED ORDER — HEPARIN SOD (PORK) LOCK FLUSH 100 UNIT/ML IV SOLN
500.0000 [IU] | Freq: Once | INTRAVENOUS | Status: AC
  Administered 2016-12-17: 500 [IU] via INTRAVENOUS
  Filled 2016-12-17: qty 5

## 2016-12-17 MED ORDER — SODIUM CHLORIDE 0.9 % IV SOLN
Freq: Once | INTRAVENOUS | Status: AC
  Administered 2016-12-17: 10:00:00 via INTRAVENOUS
  Filled 2016-12-17: qty 1000

## 2016-12-17 MED ORDER — PROCHLORPERAZINE MALEATE 10 MG PO TABS
10.0000 mg | ORAL_TABLET | Freq: Once | ORAL | Status: AC
  Administered 2016-12-17: 10 mg via ORAL
  Filled 2016-12-17: qty 1

## 2016-12-17 NOTE — Progress Notes (Signed)
AST: 117, ALT: 207. MD, Dr. Rogue Bussing, notified via telephone and already aware. Per MD order: proceed with scheduled treatment today.

## 2016-12-17 NOTE — Assessment & Plan Note (Addendum)
#  RECURRENT Metastatic breast cancer- ER/PR positive HER-2/neu negative [status post liver biopsy February 2018; previously HER-2/neu positive]. Currently s/p Taxotere Cytoxan   cycle #1 chemo appx 3 weeks ago. Poor tolerance to chemotherapy [C discussion below]. Discontinue Taxotere Cytoxan.  # Recommend starting on Eribulin day 1 and day 8 every 21 days. I discussed the potential side effects including but not limited to neuropathy; drop in the blood counts etc. However I think this should be better tolerated than Taxotere Cytoxan. Patient is agreeable. Labs adequate except for elevated LFTs [see below]; anemia hemoglobin around 10.  # Acute vascular insufficiency of the bilateral lower extremities/small visit disease versus Taxotere induced- currently on aspirin and Plavix. No active bleeding.  #  Solitary Metastatic cancer to the brain- s/p  Radiation [11/25/2016].  Status post steroids. Improved.  # Bilateral lower extremities swelling- dependent edema/steroids. Recommend leg elevation/ stockings.   # PN G-2. Continue neurontin/cymblata.   # Elevated LFTs- secondary to involvement of the liver/ AST ALT improving.   #Follow-up in one week CBC CMP chemotherapy.  # 40 minutes face-to-face with the patient discussing the above plan of care; more than 50% of time spent on prognosis/ natural history; counseling and coordination.

## 2016-12-17 NOTE — Progress Notes (Signed)
[place] OFFICE PROGRESS NOTE  Patient Care Team: [name], MD as PCP - General (Family Medicine) [name], MD (General Surgery) [name], MD (Inactive) as Attending Physician (Internal Medicine)  No matching staging information was found for the patient.   Oncology History   # LEFT BREAST IDC; STAGE II [cT2N1] ER >90%; PR- 50-90%; her 2 NEU- POS; s/p Neoadj chemo; AUG 2016- TCH+P s/p Lumpec & partial ALND- path CR; s/p RT [finished Nov 2016];  adj Herceptin; HELD for Jan 19th 2017 [in DEC 2016-EF dropped from 63 to 51%; FEB 27th EF-42.9%]; JAN 2016 START Arimidex; May 2017- EF- 60%; May 24th 2017-Re-start Herceptin q 3W; July 28th STOP herceptin [finished 53m/m2; sec to Low EF; however 2017 Oct EF= 67%; improved]  # DEC 4th 2017- Start Neratinib 4 pills; DEC 11th 3 pills; STOPPED.  # FEB 2018- RECURRENCE ER/PR positive; Her 2 NEGATIVE [liver Bx]  # MARCH 1st 2018- Tax-Cytoxan  # FEB 2018- Brain mets [SBRT; Dr.Crystal; finished Feb 28th 2018]  # PN- G-2; may 2017- Cymbalta 639md  # April 2016- Liver Bx- NEG;   # Drop in EF from Herceptin [recovered OCt 27th- EF 65%]  # BMD- jan 2017- osteopenia     Carcinoma of upper-outer quadrant of left breast in female, estrogen receptor positive ([facility]  12/27/2014 Initial Diagnosis    Breast cancer of upper-outer quadrant of left female breast ([facility]       INTERVAL HISTORY:  A very pleasant [age]ear old female patient with above history of Recurrent stage IV ER/PR positive HER-2/neu negative [status post liver biopsy February 2018] Status post cycle #1 of Cytoxan and Taxotere appx 3 weeks ago- Is here for follow-up.  Unfortunately the patient's course was complicated by acute bilateral lower extremity vascular insufficiency- without any clear obvious cause after extensive evaluation/vascular consultation. Patient course of anticoagulation was complicated by hemorrhagic shock admission the ICU  with rectal bleeding. Patient underwent colonoscopy with clipping of hemorrhaging blood present in the sigmoid colon. Patient is taken off anticoagulation at this time. She is on Plavix and aspirin.   Patient denies any more bleeding. Patient does complain of swelling in the legs. She is currently rehabilitation. She is currently off steroids.  Denies any nausea vomiting. Appetite is fair. Denies any abdominal pain. Otherwise denies any significant diarrhea. No falls.    REVIEW OF SYSTEMS:  A complete 10 point review of system is done which is negative except mentioned above/history of present illness.   PAST MEDICAL HISTORY :  Past Medical History:  Diagnosis Date  . Breast cancer ([facility]   Left-radiation  . Cancer ([facility]   Stage II, ER positive, PR positive, HER-2/neu overexpressing of the left breast.  . Chemotherapy-induced peripheral neuropathy ([facility]  . Heart attack 1998  . Hyperlipidemia   . Hypertension   . Neuromuscular disorder (HCC)    neuropathy  . [place]athy ([facility]    PAST SURGICAL HISTORY :   Past Surgical History:  Procedure Laterality Date  . BREAST BIOPSY Left 2016   Positive  . BREAST LUMPECTOMY WITH SENTINEL LYMPH NODE BIOPSY Left 05/23/2015   Procedure: LEFT BREAST WIDE EXCISION WITH AXILLARY DISSECTION, MASTOPLASTY ;  Surgeon: JeRobert BellowMD;  Location: [facility] ORS;  Service: General;  Laterality: Left;  . BREAST SURGERY Left 12/18/14   breast biopsy/INVASIVE DUCTAL CARCINOMA OF BREAST, NOTTINGHAM GRADE 2.  . Marland KitchenREAST SURGERY  05/23/2015.   Wide excision/mastoplasty, axillary dissection. No  residual invasive cancer, positive for residual DCIS. 0/2 nodes identified on axillary dissection. (no SLN by technetium or methylene blue)  . CARDIAC CATHETERIZATION    . COLONOSCOPY WITH PROPOFOL N/A 12/10/2016   Procedure: COLONOSCOPY WITH PROPOFOL;  Surgeon: Lucilla Lame, MD;  Location: [facility] ENDOSCOPY;  Service: Endoscopy;  Laterality: N/A;  .  ESOPHAGOGASTRODUODENOSCOPY (EGD) WITH PROPOFOL N/A 12/08/2016   Procedure: ESOPHAGOGASTRODUODENOSCOPY (EGD) WITH PROPOFOL;  Surgeon: Lucilla Lame, MD;  Location: [facility] ENDOSCOPY;  Service: Endoscopy;  Laterality: N/A;  . PORTACATH PLACEMENT Right 12-31-14   Dr Bary Castilla    FAMILY HISTORY :   Family History  Problem Relation Age of Onset  . Breast cancer Maternal Aunt   . Breast cancer Cousin   . Brain cancer Maternal Uncle     SOCIAL HISTORY:   Social History  Substance Use Topics  . Smoking status: Light Tobacco Smoker    Packs/day: 0.50    Years: 18.00    Types: Cigarettes  . Smokeless tobacco: Never Used  . Alcohol use No    ALLERGIES:  is allergic to no known allergies.  MEDICATIONS:  Current Outpatient Prescriptions  Medication Sig Dispense Refill  . anastrozole (ARIMIDEX) 1 MG tablet TAKE 1 TABLET DAILY 90 tablet 3  . aspirin 81 MG tablet Take 81 mg by mouth daily.    . carvedilol (COREG) 6.25 MG tablet Take 1 tablet (6.25 mg total) by mouth 2 (two) times daily. 180 tablet 0  . clopidogrel (PLAVIX) 75 MG tablet Take 1 tablet (75 mg total) by mouth daily. 30 tablet 1  . DULoxetine (CYMBALTA) 60 MG capsule TAKE 1 CAPSULE (60 MG TOTAL) BY MOUTH DAILY. 30 capsule 0  . feeding supplement, ENSURE ENLIVE, (ENSURE ENLIVE) LIQD Take 237 mLs by mouth 2 (two) times daily between meals. 237 mL 12  . pantoprazole (PROTONIX) 40 MG tablet Take 1 tablet (40 mg total) by mouth 2 (two) times daily. 60 tablet 2  . senna-docusate (SENOKOT-S) 8.6-50 MG tablet Take 1 tablet by mouth 2 (two) times daily. 60 tablet 0  . diphenoxylate-atropine (LOMOTIL) 2.5-0.025 MG tablet Take 1 tablet by mouth 4 (four) times daily as needed for diarrhea or loose stools. Take it along with immodium (Patient not taking: Reported on 12/17/2016) 60 tablet 0  . ondansetron (ZOFRAN) 8 MG tablet Take 1 tablet (8 mg total) by mouth every 8 (eight) hours as needed for nausea or vomiting (start 3 days; after chemo). (Patient not  taking: Reported on 12/17/2016) 40 tablet 1  . polyethylene glycol (MIRALAX / GLYCOLAX) packet Take 17 g by mouth daily. (Patient not taking: Reported on 12/17/2016) 14 each 0  . prochlorperazine (COMPAZINE) 10 MG tablet Take 1 tablet (10 mg total) by mouth every 6 (six) hours as needed for nausea or vomiting. (Patient not taking: Reported on 12/17/2016) 40 tablet 1   No current facility-administered medications for this visit.    Facility-Administered Medications Ordered in Other Visits  Medication Dose Route Frequency Provider Last Rate Last Dose  . 0.9 %  sodium chloride infusion   Intravenous Continuous [name], MD      . 0.9 %  sodium chloride infusion   Intravenous Continuous Lloyd Huger, MD 999 mL/hr at 04/24/15 1520    . heparin lock flush 100 unit/mL  500 Units Intravenous Once Dmitriy Berenzon, MD      . sodium chloride 0.9 % injection 10 mL  10 mL Intravenous PRN [name], MD   10 mL at 04/04/15 1440  .  sodium chloride flush (NS) 0.9 % injection 10 mL  10 mL Intravenous PRN Dmitriy Berenzon, MD        PHYSICAL EXAMINATION: ECOG PERFORMANCE STATUS: 0 - Asymptomatic  BP 107/69 (BP Location: Right Arm, Patient Position: Sitting)   Pulse 83   Temp (!) 96.7 F (35.9 C) (Tympanic)   Resp 20   Ht 5' 6" (1.676 m)   Wt 175 lb 9.6 oz (79.7 kg)   BMI 28.34 kg/m   Filed Weights   12/17/16 0941  Weight: 175 lb 9.6 oz (79.7 kg)    GENERAL: Well-nourished well-developed; Alert, no distress and comfortable.  EYES: no pallor or icterus. She is in a wheelchair. Accompanied by her son. OROPHARYNX: no thrush or ulceration; good dentition  NECK: supple, no masses felt LYMPH:  no palpable lymphadenopathy in the cervical, axillary or inguinal regions LUNGS: clear to auscultation and  No wheeze or crackles HEART/CVS: regular rate & rhythm and no murmurs; 1+ bilateral lower extremity edema. Bilateral feet/leg extremely cold to touch. Pulse is stable. Discoloration of the big  toes bilaterally/chronic. No obvious cyanosis. ABDOMEN:abdomen soft, non-tender and normal bowel sounds Musculoskeletal:no cyanosis of digits and no clubbing  PSYCH: alert & oriented x 3 with fluent speech NEURO: no focal motor/sensory deficits   LABORATORY DATA:  I have reviewed the data as listed    Component Value Date/Time   NA 136 12/17/2016 0834   NA 135 01/08/2015 0850   K 3.4 (L) 12/17/2016 0834   K 3.7 01/08/2015 0850   CL 99 (L) 12/17/2016 0834   CL 101 01/08/2015 0850   CO2 32 12/17/2016 0834   CO2 26 01/08/2015 0850   GLUCOSE 87 12/17/2016 0834   GLUCOSE 161 (H) 01/08/2015 0850   BUN 12 12/17/2016 0834   BUN 17 01/08/2015 0850   CREATININE 0.52 12/17/2016 0834   CREATININE 0.82 01/22/2015 1559   CALCIUM 8.2 (L) 12/17/2016 0834   CALCIUM 9.3 01/08/2015 0850   PROT 5.7 (L) 12/17/2016 0834   PROT 6.8 01/22/2015 1559   ALBUMIN 2.3 (L) 12/17/2016 0834   ALBUMIN 3.9 01/22/2015 1559   AST 117 (H) 12/17/2016 0834   AST 34 01/22/2015 1559   ALT 207 (H) 12/17/2016 0834   ALT 42 01/22/2015 1559   ALKPHOS 394 (H) 12/17/2016 0834   ALKPHOS 157 (H) 01/22/2015 1559   BILITOT 1.2 12/17/2016 0834   BILITOT 0.2 (L) 01/22/2015 1559   GFRNONAA >60 12/17/2016 0834   GFRNONAA >60 01/22/2015 1559   GFRAA >60 12/17/2016 0834   GFRAA >60 01/22/2015 1559    No results found for: SPEP, UPEP  Lab Results  Component Value Date   WBC 14.7 (H) 12/17/2016   NEUTROABS 13.1 (H) 12/17/2016   HGB 9.6 (L) 12/17/2016   HCT 28.3 (L) 12/17/2016   MCV 88.3 12/17/2016   PLT 251 12/17/2016      Chemistry      Component Value Date/Time   NA 136 12/17/2016 0834   NA 135 01/08/2015 0850   K 3.4 (L) 12/17/2016 0834   K 3.7 01/08/2015 0850   CL 99 (L) 12/17/2016 0834   CL 101 01/08/2015 0850   CO2 32 12/17/2016 0834   CO2 26 01/08/2015 0850   BUN 12 12/17/2016 0834   BUN 17 01/08/2015 0850   CREATININE 0.52 12/17/2016 0834   CREATININE 0.82 01/22/2015 1559      Component Value  Date/Time   CALCIUM 8.2 (L) 12/17/2016 0834   CALCIUM 9.3 01/08/2015 0850  ALKPHOS 394 (H) 12/17/2016 0834   ALKPHOS 157 (H) 01/22/2015 1559   AST 117 (H) 12/17/2016 0834   AST 34 01/22/2015 1559   ALT 207 (H) 12/17/2016 0834   ALT 42 01/22/2015 1559   BILITOT 1.2 12/17/2016 0834   BILITOT 0.2 (L) 01/22/2015 1559            LABORATORY DATA:  I have reviewed the data as listed    Component Value Date/Time   NA 136 12/17/2016 0834   NA 135 01/08/2015 0850   K 3.4 (L) 12/17/2016 0834   K 3.7 01/08/2015 0850   CL 99 (L) 12/17/2016 0834   CL 101 01/08/2015 0850   CO2 32 12/17/2016 0834   CO2 26 01/08/2015 0850   GLUCOSE 87 12/17/2016 0834   GLUCOSE 161 (H) 01/08/2015 0850   BUN 12 12/17/2016 0834   BUN 17 01/08/2015 0850   CREATININE 0.52 12/17/2016 0834   CREATININE 0.82 01/22/2015 1559   CALCIUM 8.2 (L) 12/17/2016 0834   CALCIUM 9.3 01/08/2015 0850   PROT 5.7 (L) 12/17/2016 0834   PROT 6.8 01/22/2015 1559   ALBUMIN 2.3 (L) 12/17/2016 0834   ALBUMIN 3.9 01/22/2015 1559   AST 117 (H) 12/17/2016 0834   AST 34 01/22/2015 1559   ALT 207 (H) 12/17/2016 0834   ALT 42 01/22/2015 1559   ALKPHOS 394 (H) 12/17/2016 0834   ALKPHOS 157 (H) 01/22/2015 1559   BILITOT 1.2 12/17/2016 0834   BILITOT 0.2 (L) 01/22/2015 1559   GFRNONAA >60 12/17/2016 0834   GFRNONAA >60 01/22/2015 1559   GFRAA >60 12/17/2016 0834   GFRAA >60 01/22/2015 1559    No results found for: SPEP, UPEP  Lab Results  Component Value Date   WBC 14.7 (H) 12/17/2016   NEUTROABS 13.1 (H) 12/17/2016   HGB 9.6 (L) 12/17/2016   HCT 28.3 (L) 12/17/2016   MCV 88.3 12/17/2016   PLT 251 12/17/2016      Chemistry      Component Value Date/Time   NA 136 12/17/2016 0834   NA 135 01/08/2015 0850   K 3.4 (L) 12/17/2016 0834   K 3.7 01/08/2015 0850   CL 99 (L) 12/17/2016 0834   CL 101 01/08/2015 0850   CO2 32 12/17/2016 0834   CO2 26 01/08/2015 0850   BUN 12 12/17/2016 0834   BUN 17 01/08/2015 0850    CREATININE 0.52 12/17/2016 0834   CREATININE 0.82 01/22/2015 1559      Component Value Date/Time   CALCIUM 8.2 (L) 12/17/2016 0834   CALCIUM 9.3 01/08/2015 0850   ALKPHOS 394 (H) 12/17/2016 0834   ALKPHOS 157 (H) 01/22/2015 1559   AST 117 (H) 12/17/2016 0834   AST 34 01/22/2015 1559   ALT 207 (H) 12/17/2016 0834   ALT 42 01/22/2015 1559   BILITOT 1.2 12/17/2016 0834   BILITOT 0.2 (L) 01/22/2015 1559     IMPRESSION: Normal left ventricular wall motion with estimated ejection fraction of 65%.   Electronically Signed   By: Marijo Sanes M.D.   On: 07/27/2016 09:04  RADIOGRAPHIC STUDIES: I have personally reviewed the radiological images as listed and agreed with the findings in the report. No results found.   ASSESSMENT & PLAN:  Carcinoma of upper-outer quadrant of left breast in female, estrogen receptor positive ([facility]) # RECURRENT Metastatic breast cancer- ER/PR positive HER-2/neu negative [status post liver biopsy February 2018; previously HER-2/neu positive]. Currently s/p Taxotere Cytoxan   cycle #1 chemo appx 3 weeks ago. Poor  tolerance to chemotherapy [C discussion below]. Discontinue Taxotere Cytoxan.  # Recommend starting on Eribulin day 1 and day 8 every 21 days. I discussed the potential side effects including but not limited to neuropathy; drop in the blood counts etc. However I think this should be better tolerated than Taxotere Cytoxan. Patient is agreeable. Labs adequate except for elevated LFTs [see below]; anemia hemoglobin around 10.  # Acute vascular insufficiency of the bilateral lower extremities/small visit disease versus Taxotere induced- currently on aspirin and Plavix. No active bleeding.  #  Solitary Metastatic cancer to the brain- s/p  Radiation [11/25/2016].  Status post steroids. Improved.  # Bilateral lower extremities swelling- dependent edema/steroids. Recommend leg elevation/ stockings.   # PN G-2. Continue neurontin/cymblata.   # Elevated  LFTs- secondary to involvement of the liver/ AST ALT improving.   #Follow-up in one week CBC CMP chemotherapy.  # 40 minutes face-to-face with the patient discussing the above plan of care; more than 50% of time spent on prognosis/ natural history; counseling and coordination.  Orders Placed This Encounter  Procedures  . CBC with Differential    Standing Status:   Future    Standing Expiration Date:   12/17/2017  . Comprehensive metabolic panel    Standing Status:   Future    Standing Expiration Date:   12/17/2017  . Comprehensive metabolic panel    Standing Status:   Future    Standing Expiration Date:   12/17/2017  . CBC with Differential    Standing Status:   Future    Standing Expiration Date:   12/17/2017  . Amb Referral to Nutrition and Diabetic E    Referral Priority:   Urgent    Referral Type:   Consultation    Referral Reason:   Specialty Services Required    Number of Visits Requested:   1   All questions were answered. The patient knows to call the clinic with any problems, questions or concerns.      Cammie Sickle, MD 12/17/2016 2:05 PM

## 2016-12-19 ENCOUNTER — Encounter: Payer: Self-pay | Admitting: Emergency Medicine

## 2016-12-19 ENCOUNTER — Emergency Department
Admission: EM | Admit: 2016-12-19 | Discharge: 2016-12-19 | Disposition: A | Discharge status: Home / Self Care | Payer: BLUE CROSS/BLUE SHIELD | Attending: Emergency Medicine | Admitting: Emergency Medicine

## 2016-12-19 DIAGNOSIS — Z853 Personal history of malignant neoplasm of breast: Secondary | ICD-10-CM | POA: Diagnosis not present

## 2016-12-19 DIAGNOSIS — I10 Essential (primary) hypertension: Secondary | ICD-10-CM | POA: Diagnosis not present

## 2016-12-19 DIAGNOSIS — R040 Epistaxis: Secondary | ICD-10-CM | POA: Diagnosis not present

## 2016-12-19 DIAGNOSIS — F17210 Nicotine dependence, cigarettes, uncomplicated: Secondary | ICD-10-CM | POA: Insufficient documentation

## 2016-12-19 MED ORDER — SILVER NITRATE-POT NITRATE 75-25 % EX MISC
CUTANEOUS | Status: AC
  Administered 2016-12-19: 2
  Filled 2016-12-19: qty 2

## 2016-12-19 NOTE — Discharge Instructions (Signed)
Please remember do not pick your nose. If you have wore some antibiotic ointment up in the nose. Using a humidifier is also good idea. Please return for any further problems especially pain or more bleeding.

## 2016-12-19 NOTE — ED Provider Notes (Signed)
[facility] Emergency Department Provider Note   ____________________________________________   First MD Initiated Contact with Patient 12/19/16 1154     (approximate)  I have reviewed the triage vital signs and the nursing notes.   HISTORY  Chief Complaint Epistaxis    HPI Sarah Horne is a 61 y.o. female who comes in complaining of nosebleed. Started today. They have a prescription for from the nursing home with a or else she came here. Patient instructed blow her nose and a moderate size clot came out on exam after this there was some bleeding in the right nostril in [place] backs plexus area. The nose clamp was reinstalled and another 10 minutes was left however the bleeding did not stop therefore some silver nitrate was obtained and the area was lightly cauterized.   Past Medical History:  Diagnosis Date  . Breast cancer ([place]) 2016   Left-radiation  . Cancer ([place]) 2016   Stage II, ER positive, PR positive, HER-2/neu overexpressing of the left breast.  . Chemotherapy-induced peripheral neuropathy ([place])   . Heart attack 1998  . Hyperlipidemia   . Hypertension   . Neuromuscular disorder (HCC)    neuropathy  . Neuropathy [facility])     Patient Active Problem List   Diagnosis Date Noted  . Ulceration of colon   . Heartburn   . Duodenitis   . Gastritis without bleeding   . Blood in stool   . Acute esophagitis   . Acute esophagogastric ulcer   . Rectal bleed   . Hypovolemic shock ([place])   . Ischemic leg 12/04/2016  . Chemotherapy induced neutropenia ([place]) 11/20/2016  . Palliative care by specialist   . Goals of care, counseling/discussion   . DNR (do not resuscitate) discussion   . Cerebral edema ([place]) 11/10/2016  . Encounter for monitoring cardiotoxic drug therapy 11/09/2016  . Elevated LFTs 10/20/2016  . URI with cough and congestion 08/25/2016  . Peripheral neuropathy due to chemotherapy ([place]) 03/13/2016  . Encounter for antineoplastic  chemotherapy 03/13/2016  . Hyperlipidemia 01/29/2016  . Hyperglycemia 01/29/2016  . Cardiomyopathy ([place]) 01/13/2016  . Hypertension 07/18/2015  . Carcinoma of upper-outer quadrant of left breast in female, estrogen receptor positive ([place]) 12/27/2014    Past Surgical History:  Procedure Laterality Date  . BREAST BIOPSY Left 2016   Positive  . BREAST LUMPECTOMY WITH SENTINEL LYMPH NODE BIOPSY Left 05/23/2015   Procedure: LEFT BREAST WIDE EXCISION WITH AXILLARY DISSECTION, MASTOPLASTY ;  Surgeon: [name], MD;  Location: [facility] ORS;  Service: General;  Laterality: Left;  . BREAST SURGERY Left 12/18/14   breast biopsy/INVASIVE DUCTAL CARCINOMA OF BREAST, NOTTINGHAM GRADE 2.  Marland Kitchen BREAST SURGERY  05/23/2015.   Wide excision/mastoplasty, axillary dissection. No residual invasive cancer, positive for residual DCIS. 0/2 nodes identified on axillary dissection. (no SLN by technetium or methylene blue)  . CARDIAC CATHETERIZATION    . COLONOSCOPY WITH PROPOFOL N/A 12/10/2016   Procedure: COLONOSCOPY WITH PROPOFOL;  Surgeon: Lucilla Lame, MD;  Location: [facility] ENDOSCOPY;  Service: Endoscopy;  Laterality: N/A;  . ESOPHAGOGASTRODUODENOSCOPY (EGD) WITH PROPOFOL N/A 12/08/2016   Procedure: ESOPHAGOGASTRODUODENOSCOPY (EGD) WITH PROPOFOL;  Surgeon: Lucilla Lame, MD;  Location: [facility] ENDOSCOPY;  Service: Endoscopy;  Laterality: N/A;  . PORTACATH PLACEMENT Right 12-31-14   Dr Bary Castilla    Prior to Admission medications   Medication Sig Start Date End Date Taking? Authorizing Provider  anastrozole (ARIMIDEX) 1 MG tablet TAKE 1 TABLET DAILY 11/04/16   Cammie Sickle, MD  aspirin 81 MG tablet Take 81 mg by mouth daily.    Historical Provider, MD  carvedilol (COREG) 6.25 MG tablet Take 1 tablet (6.25 mg total) by mouth 2 (two) times daily. 05/07/16   [name], MD  clopidogrel (PLAVIX) 75 MG tablet Take 1 tablet (75 mg total) by mouth daily. 12/07/16   Fritzi Mandes, MD  diphenoxylate-atropine (LOMOTIL)  2.5-0.025 MG tablet Take 1 tablet by mouth 4 (four) times daily as needed for diarrhea or loose stools. Take it along with immodium Patient not taking: Reported on 12/17/2016 09/07/16   Cammie Sickle, MD  DULoxetine (CYMBALTA) 60 MG capsule TAKE 1 CAPSULE (60 MG TOTAL) BY MOUTH DAILY. 11/24/16   Cammie Sickle, MD  feeding supplement, ENSURE ENLIVE, (ENSURE ENLIVE) LIQD Take 237 mLs by mouth 2 (two) times daily between meals. 12/07/16   Fritzi Mandes, MD  ondansetron (ZOFRAN) 8 MG tablet Take 1 tablet (8 mg total) by mouth every 8 (eight) hours as needed for nausea or vomiting (start 3 days; after chemo). Patient not taking: Reported on 12/17/2016 11/26/16   Cammie Sickle, MD  pantoprazole (PROTONIX) 40 MG tablet Take 1 tablet (40 mg total) by mouth 2 (two) times daily. 12/11/16   Fritzi Mandes, MD  polyethylene glycol (MIRALAX / GLYCOLAX) packet Take 17 g by mouth daily. Patient not taking: Reported on 12/17/2016 12/07/16   Fritzi Mandes, MD  prochlorperazine (COMPAZINE) 10 MG tablet Take 1 tablet (10 mg total) by mouth every 6 (six) hours as needed for nausea or vomiting. Patient not taking: Reported on 12/17/2016 11/26/16   Cammie Sickle, MD  senna-docusate (SENOKOT-S) 8.6-50 MG tablet Take 1 tablet by mouth 2 (two) times daily. 12/11/16   Fritzi Mandes, MD    Allergies No known allergies  Family History  Problem Relation Age of Onset  . Breast cancer Maternal Aunt   . Breast cancer Cousin   . Brain cancer Maternal Uncle     Social History Social History  Substance Use Topics  . Smoking status: Light Tobacco Smoker    Packs/day: 0.50    Years: 18.00    Types: Cigarettes  . Smokeless tobacco: Never Used  . Alcohol use No    Review of Systems Constitutional: No fever/chills Eyes: No visual changes. ENT: No sore throat. Cardiovascular: Denies chest pain. Respiratory: Denies shortness of breath. Gastrointestinal: No abdominal pain.  No nausea, no vomiting.  No diarrhea.  No  constipation. \. \ 10-point ROS otherwise negative.  ____________________________________________   PHYSICAL EXAM:  VITAL SIGNS: ED Triage Vitals  Enc Vitals Group     BP 12/19/16 1148 108/79     Pulse Rate 12/19/16 1148 97     Resp 12/19/16 1148 16     Temp 12/19/16 1148 98.8 F (37.1 C)     Temp Source 12/19/16 1148 Oral     SpO2 12/19/16 1148 98 %     Weight 12/19/16 1149 175 lb (79.4 kg)     Height --      Head Circumference --      Peak Flow --      Pain Score --      Pain Loc --      Pain Edu? --      Excl. in [place]? --     Constitutional: Alert and oriented. Well appearing and in no acute distress. Eyes: Conjunctivae are normal. PERRL. EOMI. Head: Atraumatic. Nose: As described in history of present illness Mouth/Throat: Mucous membranes are moist.  Oropharynx non-erythematous. Neck: No stridor.   Skin:  Skin is warm, dry and intact. Multiple bruises Psychiatric: Mood and affect are normal. Speech and behavior are normal.  ____________________________________________   LABS (all labs ordered are listed, but only abnormal results are displayed)  Labs Reviewed - No data to display ____________________________________________  EKG   ____________________________________________  RADIOLOGY  _________________________________________   PROCEDURES  Procedure(s) performed:   Procedures  Critical Care performed:   ____________________________________________   INITIAL IMPRESSION / ASSESSMENT AND PLAN / ED COURSE  Pertinent labs & imaging results that were available during my care of the patient were reviewed by me and considered in my medical decision making (see chart for details).   On discharge patient is doing well there is no further rebleeding     ____________________________________________   FINAL CLINICAL IMPRESSION(S) / ED DIAGNOSES  Final diagnoses:  Right-sided epistaxis      NEW MEDICATIONS STARTED DURING THIS  VISIT:  New Prescriptions   No medications on file     Note:  This document was prepared using Dragon voice recognition software and may include unintentional dictation errors.    Nena Polio, MD 12/19/16 1250

## 2016-12-19 NOTE — ED Triage Notes (Addendum)
Pt to ED via POV for nosebleed. Per EMS they were called out to [facility] at [place] for pt having nosebleed that started this morning. EMS administered Afrin nasal spray without relief. Pt states that she was recently started on blood thinners, unsure of when she started them.   Per EMS air in room was warm and dry.

## 2016-12-19 NOTE — ED Notes (Signed)
Spoke with [place] at [facility]. They do not have transportation to bring patient back.

## 2016-12-22 ENCOUNTER — Encounter (INDEPENDENT_AMBULATORY_CARE_PROVIDER_SITE_OTHER): Payer: BLUE CROSS/BLUE SHIELD | Admitting: Vascular Surgery

## 2016-12-23 ENCOUNTER — Other Ambulatory Visit: Payer: Self-pay | Admitting: Internal Medicine

## 2016-12-23 DIAGNOSIS — R262 Difficulty in walking, not elsewhere classified: Secondary | ICD-10-CM | POA: Diagnosis not present

## 2016-12-23 DIAGNOSIS — M6281 Muscle weakness (generalized): Secondary | ICD-10-CM | POA: Diagnosis not present

## 2016-12-24 ENCOUNTER — Non-Acute Institutional Stay (SKILLED_NURSING_FACILITY): Payer: BLUE CROSS/BLUE SHIELD | Admitting: Gerontology

## 2016-12-24 ENCOUNTER — Inpatient Hospital Stay: Payer: BLUE CROSS/BLUE SHIELD

## 2016-12-24 DIAGNOSIS — G620 Drug-induced polyneuropathy: Secondary | ICD-10-CM | POA: Diagnosis not present

## 2016-12-24 DIAGNOSIS — T451X5A Adverse effect of antineoplastic and immunosuppressive drugs, initial encounter: Secondary | ICD-10-CM | POA: Diagnosis not present

## 2016-12-25 ENCOUNTER — Inpatient Hospital Stay (HOSPITAL_BASED_OUTPATIENT_CLINIC_OR_DEPARTMENT_OTHER): Payer: BLUE CROSS/BLUE SHIELD | Admitting: Internal Medicine

## 2016-12-25 ENCOUNTER — Inpatient Hospital Stay: Payer: BLUE CROSS/BLUE SHIELD

## 2016-12-25 DIAGNOSIS — Z170 Estrogen receptor positive status [ER+]: Secondary | ICD-10-CM

## 2016-12-25 DIAGNOSIS — C7931 Secondary malignant neoplasm of brain: Secondary | ICD-10-CM | POA: Diagnosis not present

## 2016-12-25 DIAGNOSIS — I252 Old myocardial infarction: Secondary | ICD-10-CM

## 2016-12-25 DIAGNOSIS — E785 Hyperlipidemia, unspecified: Secondary | ICD-10-CM

## 2016-12-25 DIAGNOSIS — M7989 Other specified soft tissue disorders: Secondary | ICD-10-CM | POA: Diagnosis not present

## 2016-12-25 DIAGNOSIS — D709 Neutropenia, unspecified: Secondary | ICD-10-CM

## 2016-12-25 DIAGNOSIS — I951 Orthostatic hypotension: Secondary | ICD-10-CM | POA: Diagnosis not present

## 2016-12-25 DIAGNOSIS — R1310 Dysphagia, unspecified: Secondary | ICD-10-CM

## 2016-12-25 DIAGNOSIS — R531 Weakness: Secondary | ICD-10-CM

## 2016-12-25 DIAGNOSIS — R7989 Other specified abnormal findings of blood chemistry: Secondary | ICD-10-CM | POA: Diagnosis not present

## 2016-12-25 DIAGNOSIS — G629 Polyneuropathy, unspecified: Secondary | ICD-10-CM | POA: Diagnosis not present

## 2016-12-25 DIAGNOSIS — D72829 Elevated white blood cell count, unspecified: Secondary | ICD-10-CM | POA: Diagnosis not present

## 2016-12-25 DIAGNOSIS — Z923 Personal history of irradiation: Secondary | ICD-10-CM

## 2016-12-25 DIAGNOSIS — I10 Essential (primary) hypertension: Secondary | ICD-10-CM | POA: Diagnosis not present

## 2016-12-25 DIAGNOSIS — B370 Candidal stomatitis: Secondary | ICD-10-CM | POA: Diagnosis not present

## 2016-12-25 DIAGNOSIS — C50412 Malignant neoplasm of upper-outer quadrant of left female breast: Secondary | ICD-10-CM

## 2016-12-25 DIAGNOSIS — E860 Dehydration: Secondary | ICD-10-CM | POA: Diagnosis not present

## 2016-12-25 DIAGNOSIS — N189 Chronic kidney disease, unspecified: Secondary | ICD-10-CM | POA: Diagnosis not present

## 2016-12-25 DIAGNOSIS — Z5111 Encounter for antineoplastic chemotherapy: Secondary | ICD-10-CM | POA: Diagnosis not present

## 2016-12-25 DIAGNOSIS — M8580 Other specified disorders of bone density and structure, unspecified site: Secondary | ICD-10-CM | POA: Diagnosis not present

## 2016-12-25 DIAGNOSIS — Z803 Family history of malignant neoplasm of breast: Secondary | ICD-10-CM

## 2016-12-25 DIAGNOSIS — Z79899 Other long term (current) drug therapy: Secondary | ICD-10-CM

## 2016-12-25 DIAGNOSIS — Z7982 Long term (current) use of aspirin: Secondary | ICD-10-CM

## 2016-12-25 DIAGNOSIS — F17210 Nicotine dependence, cigarettes, uncomplicated: Secondary | ICD-10-CM

## 2016-12-25 LAB — CBC WITH DIFFERENTIAL/PLATELET
BAND NEUTROPHILS: 0 %
BASOS ABS: 0 10*3/uL (ref 0–0.1)
BASOS ABS: 0 10*3/uL (ref 0–0.1)
BASOS PCT: 2 %
BLASTS: 0 %
Basophils Relative: 0 %
EOS ABS: 0 10*3/uL (ref 0–0.7)
Eosinophils Absolute: 0 10*3/uL (ref 0–0.7)
Eosinophils Relative: 0 %
Eosinophils Relative: 0 %
HCT: 28.1 % — ABNORMAL LOW (ref 35.0–47.0)
HEMATOCRIT: 28.1 % — AB (ref 35.0–47.0)
Hemoglobin: 9.7 g/dL — ABNORMAL LOW (ref 12.0–16.0)
Hemoglobin: 9.4 g/dL — ABNORMAL LOW (ref 12.0–16.0)
LYMPHS ABS: 0.4 10*3/uL — AB (ref 1.0–3.6)
LYMPHS PCT: 58 %
Lymphocytes Relative: 66 %
Lymphs Abs: 0.3 10*3/uL — ABNORMAL LOW (ref 1.0–3.6)
MCH: 30.2 pg (ref 26.0–34.0)
MCH: 30.1 pg (ref 26.0–34.0)
MCHC: 34.4 g/dL (ref 32.0–36.0)
MCHC: 33.6 g/dL (ref 32.0–36.0)
MCV: 87.6 fL (ref 80.0–100.0)
MCV: 89.8 fL (ref 80.0–100.0)
MONO ABS: 0.1 10*3/uL — AB (ref 0.2–0.9)
Metamyelocytes Relative: 0 %
Monocytes Absolute: 0.2 10*3/uL (ref 0.2–0.9)
Monocytes Relative: 23 %
Monocytes Relative: 28 %
Myelocytes: 0 %
NEUTROS PCT: 9 %
Neutro Abs: 0.1 10*3/uL — ABNORMAL LOW (ref 1.7–7.7)
Neutro Abs: 0.1 10*3/uL — ABNORMAL LOW (ref 1.4–6.5)
Neutrophils Relative %: 14 %
OTHER: 0 %
PLATELETS: 160 10*3/uL (ref 150–440)
PROMYELOCYTES ABS: 0 %
Platelets: 163 10*3/uL (ref 150–440)
RBC: 3.2 MIL/uL — ABNORMAL LOW (ref 3.80–5.20)
RBC: 3.14 MIL/uL — ABNORMAL LOW (ref 3.80–5.20)
RDW: 21.4 % — AB (ref 11.5–14.5)
RDW: 22.6 % — AB (ref 11.5–14.5)
WBC: 0.6 10*3/uL — CL (ref 4.0–10.5)
WBC: 0.6 10*3/uL — CL (ref 3.6–11.0)
nRBC: 22 /100 WBC — ABNORMAL HIGH
nRBC: 62 /100 WBC — ABNORMAL HIGH

## 2016-12-25 LAB — COMPREHENSIVE METABOLIC PANEL
ALBUMIN: 2.5 g/dL — AB (ref 3.5–5.0)
ALBUMIN: 2.4 g/dL — AB (ref 3.5–5.0)
ALT: 93 U/L — ABNORMAL HIGH (ref 14–54)
ALT: 88 U/L — AB (ref 14–54)
ANION GAP: 10 (ref 5–15)
AST: 129 U/L — AB (ref 15–41)
AST: 118 U/L — AB (ref 15–41)
Alkaline Phosphatase: 349 U/L — ABNORMAL HIGH (ref 38–126)
Alkaline Phosphatase: 343 U/L — ABNORMAL HIGH (ref 38–126)
Anion gap: 10 (ref 5–15)
BUN: 10 mg/dL (ref 6–20)
BUN: 8 mg/dL (ref 6–20)
CHLORIDE: 94 mmol/L — AB (ref 101–111)
CHLORIDE: 98 mmol/L — AB (ref 101–111)
CO2: 27 mmol/L (ref 22–32)
CO2: 27 mmol/L (ref 22–32)
CREATININE: 0.45 mg/dL (ref 0.44–1.00)
Calcium: 8.4 mg/dL — ABNORMAL LOW (ref 8.9–10.3)
Calcium: 8.1 mg/dL — ABNORMAL LOW (ref 8.9–10.3)
Creatinine, Ser: 0.45 mg/dL (ref 0.44–1.00)
GFR calc Af Amer: >60 mL/min (ref >60)
GFR calc Af Amer: >60 mL/min (ref >60)
GFR calc non Af Amer: >60 mL/min (ref >60)
GFR calc non Af Amer: >60 mL/min (ref >60)
GLUCOSE: 100 mg/dL — AB (ref 65–99)
Glucose, Bld: 60 mg/dL — ABNORMAL LOW (ref 65–99)
POTASSIUM: 3.6 mmol/L (ref 3.5–5.1)
POTASSIUM: 3.5 mmol/L (ref 3.5–5.1)
SODIUM: 131 mmol/L — AB (ref 135–145)
SODIUM: 135 mmol/L (ref 135–145)
Total Bilirubin: 1.5 mg/dL — ABNORMAL HIGH (ref 0.3–1.2)
Total Bilirubin: 1.2 mg/dL (ref 0.3–1.2)
Total Protein: 6.6 g/dL (ref 6.5–8.1)
Total Protein: 6 g/dL — ABNORMAL LOW (ref 6.5–8.1)

## 2016-12-25 MED ORDER — SODIUM CHLORIDE 0.9 % IV SOLN
Freq: Once | INTRAVENOUS | Status: AC
  Administered 2016-12-25: 10:00:00 via INTRAVENOUS
  Filled 2016-12-25: qty 1000

## 2016-12-25 MED ORDER — HEPARIN SOD (PORK) LOCK FLUSH 100 UNIT/ML IV SOLN
500.0000 [IU] | Freq: Once | INTRAVENOUS | Status: AC | PRN
  Administered 2016-12-25: 500 [IU]
  Filled 2016-12-25: qty 5

## 2016-12-25 MED ORDER — TBO-FILGRASTIM 480 MCG/0.8ML ~~LOC~~ SOSY
480.0000 ug | PREFILLED_SYRINGE | Freq: Once | SUBCUTANEOUS | Status: AC
  Administered 2016-12-25: 480 ug via SUBCUTANEOUS
  Filled 2016-12-25: qty 0.8

## 2016-12-25 MED ORDER — FLUCONAZOLE 100 MG PO TABS: 100.0000 mg | ORAL_TABLET | Freq: Every day | ORAL | 0 refills | Status: DC

## 2016-12-25 MED ORDER — SODIUM CHLORIDE 0.9% FLUSH
10.0000 mL | INTRAVENOUS | Status: DC | PRN
  Administered 2016-12-25: 10 mL
  Filled 2016-12-25: qty 10

## 2016-12-25 NOTE — Assessment & Plan Note (Addendum)
#  RECURRENT Metastatic breast cancer- ER/PR positive HER-2/neu negative [status post liver biopsy February 2018; previously HER-2/neu positive]. # s/p  Eribulin day 1 - 1 week ago-   # HOLD cycle 1- day 8 today- sec to severe neutropenia [see discussion below]  # Dehydration/secondary to poor by mouth intake/difficulty swallowing/thrush- plan IVFs today.  # Severe neutropenia- hold chemo; start granix day x5 days.   # History of recent Acute vascular insufficiency of the bilateral lower extremities/small visit disease versus Taxotere induced- currently on aspirin and Plavix. Given intermittent bleeding- discontinue plavix. Continue aspirin  # Oral thrush- add diflucan 100 mg/day x7 day; cont nystatin; stop steroids.   #  Solitary Metastatic cancer to the brain- s/p  Radiation [11/25/2016].  Status post steroids. Improved. Discontinue steroids.   # Bilateral lower extremities swelling- dependent edema/steroids. Recommend leg elevation/ stockings.   # PN G-2. Continue neurontin/cymblata.   # Elevated LFTs- secondary to involvement of the liver/ AST ALT improving/stable; discontinue simvastatin.   # 1 week follow up/ no chemo/labs/ IVFs. Daily Granix up every day x 5 days including today.

## 2016-12-25 NOTE — Progress Notes (Signed)
[place] OFFICE PROGRESS NOTE  Patient Care Team: [name], MD as PCP - General (Family Medicine) [name], MD (General Surgery) [name], MD (Inactive) as Attending Physician (Internal Medicine)  No matching staging information was found for the patient.   Oncology History   # LEFT BREAST IDC; STAGE II [cT2N1] ER >90%; PR- 50-90%; her 2 NEU- POS; s/p Neoadj chemo; AUG 2016- TCH+P s/p Lumpec & partial ALND- path CR; s/p RT [finished Nov 2016];  adj Herceptin; HELD for Jan 19th 2017 [in DEC 2016-EF dropped from 63 to 51%; FEB 27th EF-42.9%]; JAN 2016 START Arimidex; May 2017- EF- 60%; May 24th 2017-Re-start Herceptin q 3W; July 28th STOP herceptin [finished 48m/m2; sec to Low EF; however 2017 Oct EF= 67%; improved]  # DEC 4th 2017- Start Neratinib 4 pills; DEC 11th 3 pills; STOPPED.  # FEB 2018- RECURRENCE ER/PR positive; Her 2 NEGATIVE [liver Bx]  # MARCH 1st 2018- Tax-Cytoxan  # FEB 2018- Brain mets [SBRT; Dr.Crystal; finished Feb 28th 2018]  # PN- G-2; may 2017- Cymbalta 679md  # April 2016- Liver Bx- NEG;   # Drop in EF from Herceptin [recovered OCt 27th- EF 65%]  # BMD- jan 2017- osteopenia     Carcinoma of upper-outer quadrant of left breast in female, estrogen receptor positive ([place]  12/27/2014 Initial Diagnosis    Breast cancer of upper-outer quadrant of left female breast ([facility]       INTERVAL HISTORY:  A very pleasant [age]ear old female patient with above history of Recurrent stage IV ER/PR positive HER-2/neu negative [status post liver biopsy February 2018] Status post cycle #1 day-1 of eribulin- approximately 1 week ago.  Patient continues to be at the rehabilitation; with a potential plan of discharge to home tomorrow.  Patient complains of difficulty swallowing. Complains of pain with swallowing. Otherwise she denies any significant worsening of the tingling and numbness. She continues to have mild leg swelling. She  has intermittent blood in the stools. She has poor by mouth intake.   Denies any nausea vomiting. Appetite is fair. Denies any abdominal pain. Otherwise denies any significant diarrhea. No falls.    REVIEW OF SYSTEMS:  A complete 10 point review of system is done which is negative except mentioned above/history of present illness.   PAST MEDICAL HISTORY :  Past Medical History:  Diagnosis Date  . Breast cancer ([facility]2016   Left-radiation  . Cancer ([facility]2016   Stage II, ER positive, PR positive, HER-2/neu overexpressing of the left breast.  . Chemotherapy-induced peripheral neuropathy ([facility]  . Heart attack 1998  . Hyperlipidemia   . Hypertension   . Neuromuscular disorder (HCC)    neuropathy  . Neuropathy ([facility]    PAST SURGICAL HISTORY :   Past Surgical History:  Procedure Laterality Date  . BREAST BIOPSY Left 2016   Positive  . BREAST LUMPECTOMY WITH SENTINEL LYMPH NODE BIOPSY Left 05/23/2015   Procedure: LEFT BREAST WIDE EXCISION WITH AXILLARY DISSECTION, MASTOPLASTY ;  Surgeon: JeRobert BellowMD;  Location: [facility] ORS;  Service: General;  Laterality: Left;  . BREAST SURGERY Left 12/18/14   breast biopsy/INVASIVE DUCTAL CARCINOMA OF BREAST, NOTTINGHAM GRADE 2.  . Marland KitchenREAST SURGERY  05/23/2015.   Wide excision/mastoplasty, axillary dissection. No residual invasive cancer, positive for residual DCIS. 0/2 nodes identified on axillary dissection. (no SLN by technetium or methylene blue)  . CARDIAC CATHETERIZATION    . COLONOSCOPY WITH PROPOFOL N/A 12/10/2016  Procedure: COLONOSCOPY WITH PROPOFOL;  Surgeon: Lucilla Lame, MD;  Location: [facility] ENDOSCOPY;  Service: Endoscopy;  Laterality: N/A;  . ESOPHAGOGASTRODUODENOSCOPY (EGD) WITH PROPOFOL N/A 12/08/2016   Procedure: ESOPHAGOGASTRODUODENOSCOPY (EGD) WITH PROPOFOL;  Surgeon: Lucilla Lame, MD;  Location: [facility] ENDOSCOPY;  Service: Endoscopy;  Laterality: N/A;  . PORTACATH PLACEMENT Right 12-31-14   Dr Bary Castilla    FAMILY HISTORY :    Family History  Problem Relation Age of Onset  . Breast cancer Maternal Aunt   . Breast cancer Cousin   . Brain cancer Maternal Uncle     SOCIAL HISTORY:   Social History  Substance Use Topics  . Smoking status: Light Tobacco Smoker    Packs/day: 0.50    Years: 18.00    Types: Cigarettes  . Smokeless tobacco: Never Used  . Alcohol use No    ALLERGIES:  is allergic to no known allergies.  MEDICATIONS:  Current Outpatient Prescriptions  Medication Sig Dispense Refill  . anastrozole (ARIMIDEX) 1 MG tablet TAKE 1 TABLET DAILY 90 tablet 3  . aspirin 81 MG tablet Take 81 mg by mouth daily.    . clopidogrel (PLAVIX) 75 MG tablet Take 1 tablet (75 mg total) by mouth daily. 30 tablet 1  . DULoxetine (CYMBALTA) 60 MG capsule TAKE 1 CAPSULE (60 MG TOTAL) BY MOUTH DAILY. 30 capsule 0  . feeding supplement, ENSURE ENLIVE, (ENSURE ENLIVE) LIQD Take 237 mLs by mouth 2 (two) times daily between meals. 237 mL 12  . KLOR-CON M20 20 MEQ tablet Take 20 mEq by mouth 2 (two) times daily.  3  . lisinopril (PRINIVIL,ZESTRIL) 10 MG tablet Take 10 mg by mouth daily.    Marland Kitchen nystatin (MYCOSTATIN) 100000 UNIT/ML suspension     . pantoprazole (PROTONIX) 40 MG tablet Take 1 tablet (40 mg total) by mouth 2 (two) times daily. 60 tablet 2  . polyethylene glycol (MIRALAX / GLYCOLAX) packet Take 17 g by mouth daily. 14 each 0  . senna-docusate (SENOKOT-S) 8.6-50 MG tablet Take 1 tablet by mouth 2 (two) times daily. 60 tablet 0  . simvastatin (ZOCOR) 40 MG tablet Take 40 mg by mouth daily.    . carvedilol (COREG) 6.25 MG tablet Take 1 tablet (6.25 mg total) by mouth 2 (two) times daily. (Patient not taking: Reported on 12/25/2016) 180 tablet 0  . diphenoxylate-atropine (LOMOTIL) 2.5-0.025 MG tablet Take 1 tablet by mouth 4 (four) times daily as needed for diarrhea or loose stools. Take it along with immodium (Patient not taking: Reported on 12/17/2016) 60 tablet 0  . fluconazole (DIFLUCAN) 100 MG tablet Take 1 tablet  (100 mg total) by mouth daily. 7 tablet 0  . ondansetron (ZOFRAN) 8 MG tablet Take 1 tablet (8 mg total) by mouth every 8 (eight) hours as needed for nausea or vomiting (start 3 days; after chemo). (Patient not taking: Reported on 12/17/2016) 40 tablet 1  . prochlorperazine (COMPAZINE) 10 MG tablet Take 1 tablet (10 mg total) by mouth every 6 (six) hours as needed for nausea or vomiting. (Patient not taking: Reported on 12/17/2016) 40 tablet 1   No current facility-administered medications for this visit.    Facility-Administered Medications Ordered in Other Visits  Medication Dose Route Frequency Provider Last Rate Last Dose  . 0.9 %  sodium chloride infusion   Intravenous Continuous [name], MD      . 0.9 %  sodium chloride infusion   Intravenous Continuous Lloyd Huger, MD 999 mL/hr at 04/24/15 1520    . heparin  lock flush 100 unit/mL  500 Units Intravenous Once Dmitriy Berenzon, MD      . sodium chloride 0.9 % injection 10 mL  10 mL Intravenous PRN [name], MD   10 mL at 04/04/15 1440  . sodium chloride flush (NS) 0.9 % injection 10 mL  10 mL Intravenous PRN Dmitriy Berenzon, MD        PHYSICAL EXAMINATION: ECOG PERFORMANCE STATUS: 0 - Asymptomatic  BP 90/67 (BP Location: Right Arm, Patient Position: Sitting)   Pulse (!) 149   Temp 97.6 F (36.4 C) (Tympanic)   Resp 18   Wt 143 lb 5 oz (65 kg)   BMI 23.13 kg/m   Filed Weights   12/25/16 0850  Weight: 143 lb 5 oz (65 kg)    GENERAL: Well-nourished well-developed; Alert, no distress and comfortable.  EYES: no pallor or icterus. She is in a wheelchair.She is alone. OROPHARYNX: no thrush or ulceration; good dentition  NECK: supple, no masses felt LYMPH:  no palpable lymphadenopathy in the cervical, axillary or inguinal regions LUNGS: clear to auscultation and  No wheeze or crackles HEART/CVS: regular rate & rhythm and no murmurs; 1+ bilateral lower extremity edema.  ABDOMEN:abdomen soft, non-tender and normal  bowel sounds Musculoskeletal:no cyanosis of digits and no clubbing  PSYCH: alert & oriented x 3 with fluent speech NEURO: no focal motor/sensory deficits   LABORATORY DATA:  I have reviewed the data as listed    Component Value Date/Time   NA 131 (L) 12/25/2016 0818   NA 135 01/08/2015 0850   K 3.6 12/25/2016 0818   K 3.7 01/08/2015 0850   CL 94 (L) 12/25/2016 0818   CL 101 01/08/2015 0850   CO2 27 12/25/2016 0818   CO2 26 01/08/2015 0850   GLUCOSE 100 (H) 12/25/2016 0818   GLUCOSE 161 (H) 01/08/2015 0850   BUN 10 12/25/2016 0818   BUN 17 01/08/2015 0850   CREATININE 0.45 12/25/2016 0818   CREATININE 0.82 01/22/2015 1559   CALCIUM 8.4 (L) 12/25/2016 0818   CALCIUM 9.3 01/08/2015 0850   PROT 6.6 12/25/2016 0818   PROT 6.8 01/22/2015 1559   ALBUMIN 2.5 (L) 12/25/2016 0818   ALBUMIN 3.9 01/22/2015 1559   AST 129 (H) 12/25/2016 0818   AST 34 01/22/2015 1559   ALT 93 (H) 12/25/2016 0818   ALT 42 01/22/2015 1559   ALKPHOS 349 (H) 12/25/2016 0818   ALKPHOS 157 (H) 01/22/2015 1559   BILITOT 1.5 (H) 12/25/2016 0818   BILITOT 0.2 (L) 01/22/2015 1559   GFRNONAA >60 12/25/2016 0818   GFRNONAA >60 01/22/2015 1559   GFRAA >60 12/25/2016 0818   GFRAA >60 01/22/2015 1559    No results found for: SPEP, UPEP  Lab Results  Component Value Date   WBC 0.6 (LL) 12/25/2016   NEUTROABS 0.1 (L) 12/25/2016   HGB 9.7 (L) 12/25/2016   HCT 28.1 (L) 12/25/2016   MCV 87.6 12/25/2016   PLT 163 12/25/2016      Chemistry      Component Value Date/Time   NA 131 (L) 12/25/2016 0818   NA 135 01/08/2015 0850   K 3.6 12/25/2016 0818   K 3.7 01/08/2015 0850   CL 94 (L) 12/25/2016 0818   CL 101 01/08/2015 0850   CO2 27 12/25/2016 0818   CO2 26 01/08/2015 0850   BUN 10 12/25/2016 0818   BUN 17 01/08/2015 0850   CREATININE 0.45 12/25/2016 0818   CREATININE 0.82 01/22/2015 1559      Component  Value Date/Time   CALCIUM 8.4 (L) 12/25/2016 0818   CALCIUM 9.3 01/08/2015 0850   ALKPHOS 349  (H) 12/25/2016 0818   ALKPHOS 157 (H) 01/22/2015 1559   AST 129 (H) 12/25/2016 0818   AST 34 01/22/2015 1559   ALT 93 (H) 12/25/2016 0818   ALT 42 01/22/2015 1559   BILITOT 1.5 (H) 12/25/2016 0818   BILITOT 0.2 (L) 01/22/2015 1559            LABORATORY DATA:  I have reviewed the data as listed    Component Value Date/Time   NA 131 (L) 12/25/2016 0818   NA 135 01/08/2015 0850   K 3.6 12/25/2016 0818   K 3.7 01/08/2015 0850   CL 94 (L) 12/25/2016 0818   CL 101 01/08/2015 0850   CO2 27 12/25/2016 0818   CO2 26 01/08/2015 0850   GLUCOSE 100 (H) 12/25/2016 0818   GLUCOSE 161 (H) 01/08/2015 0850   BUN 10 12/25/2016 0818   BUN 17 01/08/2015 0850   CREATININE 0.45 12/25/2016 0818   CREATININE 0.82 01/22/2015 1559   CALCIUM 8.4 (L) 12/25/2016 0818   CALCIUM 9.3 01/08/2015 0850   PROT 6.6 12/25/2016 0818   PROT 6.8 01/22/2015 1559   ALBUMIN 2.5 (L) 12/25/2016 0818   ALBUMIN 3.9 01/22/2015 1559   AST 129 (H) 12/25/2016 0818   AST 34 01/22/2015 1559   ALT 93 (H) 12/25/2016 0818   ALT 42 01/22/2015 1559   ALKPHOS 349 (H) 12/25/2016 0818   ALKPHOS 157 (H) 01/22/2015 1559   BILITOT 1.5 (H) 12/25/2016 0818   BILITOT 0.2 (L) 01/22/2015 1559   GFRNONAA >60 12/25/2016 0818   GFRNONAA >60 01/22/2015 1559   GFRAA >60 12/25/2016 0818   GFRAA >60 01/22/2015 1559    No results found for: SPEP, UPEP  Lab Results  Component Value Date   WBC 0.6 (LL) 12/25/2016   NEUTROABS 0.1 (L) 12/25/2016   HGB 9.7 (L) 12/25/2016   HCT 28.1 (L) 12/25/2016   MCV 87.6 12/25/2016   PLT 163 12/25/2016      Chemistry      Component Value Date/Time   NA 131 (L) 12/25/2016 0818   NA 135 01/08/2015 0850   K 3.6 12/25/2016 0818   K 3.7 01/08/2015 0850   CL 94 (L) 12/25/2016 0818   CL 101 01/08/2015 0850   CO2 27 12/25/2016 0818   CO2 26 01/08/2015 0850   BUN 10 12/25/2016 0818   BUN 17 01/08/2015 0850   CREATININE 0.45 12/25/2016 0818   CREATININE 0.82 01/22/2015 1559      Component  Value Date/Time   CALCIUM 8.4 (L) 12/25/2016 0818   CALCIUM 9.3 01/08/2015 0850   ALKPHOS 349 (H) 12/25/2016 0818   ALKPHOS 157 (H) 01/22/2015 1559   AST 129 (H) 12/25/2016 0818   AST 34 01/22/2015 1559   ALT 93 (H) 12/25/2016 0818   ALT 42 01/22/2015 1559   BILITOT 1.5 (H) 12/25/2016 0818   BILITOT 0.2 (L) 01/22/2015 1559     IMPRESSION: Normal left ventricular wall motion with estimated ejection fraction of 65%.   Electronically Signed   By: Marijo Sanes M.D.   On: 07/27/2016 09:04  RADIOGRAPHIC STUDIES: I have personally reviewed the radiological images as listed and agreed with the findings in the report. No results found.   ASSESSMENT & PLAN:  Carcinoma of upper-outer quadrant of left breast in female, estrogen receptor positive ([place]) # RECURRENT Metastatic breast cancer- ER/PR positive HER-2/neu negative [status post  liver biopsy February 2018; previously HER-2/neu positive]. # s/p  Eribulin day 1 - 1 week ago-   # HOLD cycle 1- day 8 today- sec to severe neutropenia [see discussion below]  # Dehydration/secondary to poor by mouth intake/difficulty swallowing/thrush- plan IVFs today.  # Severe neutropenia- hold chemo; start granix day x5 days.   # History of recent Acute vascular insufficiency of the bilateral lower extremities/small visit disease versus Taxotere induced- currently on aspirin and Plavix. Given intermittent bleeding- discontinue plavix. Continue aspirin  # Oral thrush- add diflucan 100 mg/day x7 day; cont nystatin; stop steroids.   #  Solitary Metastatic cancer to the brain- s/p  Radiation [11/25/2016].  Status post steroids. Improved. Discontinue steroids.   # Bilateral lower extremities swelling- dependent edema/steroids. Recommend leg elevation/ stockings.   # PN G-2. Continue neurontin/cymblata.   # Elevated LFTs- secondary to involvement of the liver/ AST ALT improving/stable; discontinue simvastatin.   # 1 week follow up/ no chemo/labs/  IVFs. Daily Granix up every day x 5 days including today.  No orders of the defined types were placed in this encounter.  All questions were answered. The patient knows to call the clinic with any problems, questions or concerns.      Cammie Sickle, MD 12/25/2016 5:48 PM

## 2016-12-25 NOTE — Progress Notes (Signed)
Patient states her throat has been sore for past several days.  Nystatin was called in for her but she is asking for something else today.  Unable to eat very well.  Also BP 90/67 HR 149.  Patient has been unable to drink because of sore throat.

## 2016-12-25 NOTE — Progress Notes (Signed)
Lonnie in cancer center called critial anc 0.1 to [place] @ 8:57AM- read back process performed. md informed at 0905 am - read back process performed with md. Will proceed with granix injection daily x 5 days.

## 2016-12-26 ENCOUNTER — Ambulatory Visit: Payer: BLUE CROSS/BLUE SHIELD

## 2016-12-26 ENCOUNTER — Emergency Department
Admission: EM | Admit: 2016-12-26 | Discharge: 2016-12-26 | Disposition: A | Discharge status: Home / Self Care | Payer: BLUE CROSS/BLUE SHIELD | Attending: Emergency Medicine | Admitting: Emergency Medicine

## 2016-12-26 ENCOUNTER — Encounter: Payer: Self-pay | Admitting: Medical Oncology

## 2016-12-26 VITALS — BP 88/51 | HR 171 | Temp 97.4°F | Resp 22

## 2016-12-26 DIAGNOSIS — F17210 Nicotine dependence, cigarettes, uncomplicated: Secondary | ICD-10-CM | POA: Insufficient documentation

## 2016-12-26 DIAGNOSIS — Z79899 Other long term (current) drug therapy: Secondary | ICD-10-CM | POA: Insufficient documentation

## 2016-12-26 DIAGNOSIS — Z7982 Long term (current) use of aspirin: Secondary | ICD-10-CM | POA: Diagnosis not present

## 2016-12-26 DIAGNOSIS — I471 Supraventricular tachycardia: Secondary | ICD-10-CM | POA: Diagnosis not present

## 2016-12-26 DIAGNOSIS — Z853 Personal history of malignant neoplasm of breast: Secondary | ICD-10-CM | POA: Diagnosis not present

## 2016-12-26 DIAGNOSIS — R000 Tachycardia, unspecified: Secondary | ICD-10-CM | POA: Diagnosis present

## 2016-12-26 DIAGNOSIS — I10 Essential (primary) hypertension: Secondary | ICD-10-CM | POA: Diagnosis not present

## 2016-12-26 MED ORDER — TBO-FILGRASTIM 300 MCG/0.5ML ~~LOC~~ SOSY
300.0000 ug | PREFILLED_SYRINGE | Freq: Once | SUBCUTANEOUS | Status: AC
  Administered 2016-12-26: 300 ug via SUBCUTANEOUS
  Filled 2016-12-26: qty 0.5

## 2016-12-26 MED ORDER — TBO-FILGRASTIM 480 MCG/0.8ML ~~LOC~~ SOSY: 480.0000 ug | PREFILLED_SYRINGE | Freq: Once | SUBCUTANEOUS | Status: DC

## 2016-12-26 NOTE — ED Provider Notes (Signed)
[facility] Emergency Department Provider Note  ____________________________________________   First MD Initiated Contact with Patient 12/26/16 1456     (approximate)  I have reviewed the triage vital signs and the nursing notes.   HISTORY  Chief Complaint Tachycardia    HPI Sarah Horne is a 61 y.o. female sent to the emergency department from the cancer center for an elevated heart rate to 170 and low blood pressure.She was at the cancer center to receive an injection of granix for neutropenia. She has a history of recurrent metastatic breast cancer and most recently had blood work yesterday showing absolute neutrophils of 0.1. Overall she feels somewhat tired but mostly at her baseline. She has not had any fever whatsoever. Had she not been sent to the emergency department from the cancer center she would not have come to the ER.   Past Medical History:  Diagnosis Date  . Breast cancer ([place]) 2016   Left-radiation  . Cancer ([place]) 2016   Stage II, ER positive, PR positive, HER-2/neu overexpressing of the left breast.  . Chemotherapy-induced peripheral neuropathy ([place])   . Heart attack 1998  . Hyperlipidemia   . Hypertension   . Neuromuscular disorder (HCC)    neuropathy  . Neuropathy [facility])     Patient Active Problem List   Diagnosis Date Noted  . Dehydration 12/25/2016  . Ulceration of colon   . Heartburn   . Duodenitis   . Gastritis without bleeding   . Blood in stool   . Acute esophagitis   . Acute esophagogastric ulcer   . Rectal bleed   . Hypovolemic shock ([place])   . Ischemic leg 12/04/2016  . Chemotherapy induced neutropenia ([place]) 11/20/2016  . Palliative care by specialist   . Goals of care, counseling/discussion   . DNR (do not resuscitate) discussion   . Cerebral edema ([place]) 11/10/2016  . Encounter for monitoring cardiotoxic drug therapy 11/09/2016  . Elevated LFTs 10/20/2016  . URI with cough and congestion 08/25/2016  .  Peripheral neuropathy due to chemotherapy ([place]) 03/13/2016  . Encounter for antineoplastic chemotherapy 03/13/2016  . Hyperlipidemia 01/29/2016  . Hyperglycemia 01/29/2016  . Cardiomyopathy ([place]) 01/13/2016  . Hypertension 07/18/2015  . Carcinoma of upper-outer quadrant of left breast in female, estrogen receptor positive ([place]) 12/27/2014    Past Surgical History:  Procedure Laterality Date  . BREAST BIOPSY Left 2016   Positive  . BREAST LUMPECTOMY WITH SENTINEL LYMPH NODE BIOPSY Left 05/23/2015   Procedure: LEFT BREAST WIDE EXCISION WITH AXILLARY DISSECTION, MASTOPLASTY ;  Surgeon: [name], MD;  Location: [facility] ORS;  Service: General;  Laterality: Left;  . BREAST SURGERY Left 12/18/14   breast biopsy/INVASIVE DUCTAL CARCINOMA OF BREAST, NOTTINGHAM GRADE 2.  Marland Kitchen BREAST SURGERY  05/23/2015.   Wide excision/mastoplasty, axillary dissection. No residual invasive cancer, positive for residual DCIS. 0/2 nodes identified on axillary dissection. (no SLN by technetium or methylene blue)  . CARDIAC CATHETERIZATION    . COLONOSCOPY WITH PROPOFOL N/A 12/10/2016   Procedure: COLONOSCOPY WITH PROPOFOL;  Surgeon: Lucilla Lame, MD;  Location: [facility] ENDOSCOPY;  Service: Endoscopy;  Laterality: N/A;  . ESOPHAGOGASTRODUODENOSCOPY (EGD) WITH PROPOFOL N/A 12/08/2016   Procedure: ESOPHAGOGASTRODUODENOSCOPY (EGD) WITH PROPOFOL;  Surgeon: Lucilla Lame, MD;  Location: [facility] ENDOSCOPY;  Service: Endoscopy;  Laterality: N/A;  . PORTACATH PLACEMENT Right 12-31-14   Dr Bary Castilla    Prior to Admission medications   Medication Sig Start Date End Date Taking? Authorizing Provider  polyethylene glycol (MIRALAX /  GLYCOLAX) packet Take 17 g by mouth daily. 12/07/16  Yes Fritzi Mandes, MD  senna-docusate (SENOKOT-S) 8.6-50 MG tablet Take 1 tablet by mouth 2 (two) times daily. 12/11/16  Yes Fritzi Mandes, MD  anastrozole (ARIMIDEX) 1 MG tablet TAKE 1 TABLET DAILY 11/04/16   Cammie Sickle, MD  aspirin 81 MG tablet Take 81 mg by  mouth daily.    Historical Provider, MD  carvedilol (COREG) 6.25 MG tablet Take 1 tablet (6.25 mg total) by mouth 2 (two) times daily. Patient not taking: Reported on 12/25/2016 05/07/16   [name], MD  clopidogrel (PLAVIX) 75 MG tablet Take 1 tablet (75 mg total) by mouth daily. 12/07/16   Fritzi Mandes, MD  diphenoxylate-atropine (LOMOTIL) 2.5-0.025 MG tablet Take 1 tablet by mouth 4 (four) times daily as needed for diarrhea or loose stools. Take it along with immodium Patient not taking: Reported on 12/17/2016 09/07/16   Cammie Sickle, MD  DULoxetine (CYMBALTA) 60 MG capsule TAKE 1 CAPSULE (60 MG TOTAL) BY MOUTH DAILY. 12/23/16   Cammie Sickle, MD  feeding supplement, ENSURE ENLIVE, (ENSURE ENLIVE) LIQD Take 237 mLs by mouth 2 (two) times daily between meals. 12/07/16   Fritzi Mandes, MD  fluconazole (DIFLUCAN) 100 MG tablet Take 1 tablet (100 mg total) by mouth daily. 12/25/16   Cammie Sickle, MD  KLOR-CON M20 20 MEQ tablet Take 20 mEq by mouth 2 (two) times daily. 11/26/16   Historical Provider, MD  lisinopril (PRINIVIL,ZESTRIL) 10 MG tablet Take 10 mg by mouth daily. 09/30/16   Historical Provider, MD  nystatin (MYCOSTATIN) 100000 UNIT/ML suspension  12/02/16   Historical Provider, MD  ondansetron (ZOFRAN) 8 MG tablet Take 1 tablet (8 mg total) by mouth every 8 (eight) hours as needed for nausea or vomiting (start 3 days; after chemo). Patient not taking: Reported on 12/17/2016 11/26/16   Cammie Sickle, MD  pantoprazole (PROTONIX) 40 MG tablet Take 1 tablet (40 mg total) by mouth 2 (two) times daily. 12/11/16   Fritzi Mandes, MD  prochlorperazine (COMPAZINE) 10 MG tablet Take 1 tablet (10 mg total) by mouth every 6 (six) hours as needed for nausea or vomiting. Patient not taking: Reported on 12/17/2016 11/26/16   Cammie Sickle, MD  simvastatin (ZOCOR) 40 MG tablet Take 40 mg by mouth daily. 09/30/16   Historical Provider, MD    Allergies No known allergies  Family History    Problem Relation Age of Onset  . Breast cancer Maternal Aunt   . Breast cancer Cousin   . Brain cancer Maternal Uncle     Social History Social History  Substance Use Topics  . Smoking status: Light Tobacco Smoker    Packs/day: 0.50    Years: 18.00    Types: Cigarettes  . Smokeless tobacco: Never Used  . Alcohol use No    Review of Systems Constitutional: No fever/chills Eyes: No visual changes. ENT: No sore throat. Cardiovascular: Denies chest pain. Respiratory: Denies shortness of breath. Gastrointestinal: No abdominal pain.  No nausea, no vomiting.  No diarrhea.  No constipation. Genitourinary: Negative for dysuria. Musculoskeletal: Negative for back pain. Skin: Negative for rash. Neurological: Negative for headaches, focal weakness or numbness.  10-point ROS otherwise negative.  ____________________________________________   PHYSICAL EXAM:  VITAL SIGNS: ED Triage Vitals  Enc Vitals Group     BP 12/26/16 1450 (!) 86/67     Pulse Rate 12/26/16 1450 (!) 165     Resp 12/26/16 1450 14  Temp 12/26/16 1450 97.8 F (36.6 C)     Temp Source 12/26/16 1450 Oral     SpO2 12/26/16 1450 99 %     Weight 12/26/16 1451 143 lb (64.9 kg)     Height 12/26/16 1451 5' 6"  (1.676 m)     Head Circumference --      Peak Flow --      Pain Score 12/26/16 1449 0     Pain Loc --      Pain Edu? --      Excl. in [place]? --     Constitutional: Alert and oriented x 4 well appearing nontoxic no diaphoresis speaks in full, clear sentences Eyes: PERRL EOMI. Head: Atraumatic. Nose: No congestion/rhinnorhea. Mouth/Throat: No trismus uvula midline no pharyngeal erythema or exudate no lymphadenopathy Neck: No stridor.   Cardiovascular: Tachycardic rate, regular rhythm. Grossly normal heart sounds.  Good peripheral circulation. Respiratory: Normal respiratory effort.  No retractions. Lungs CTAB and moving good air Gastrointestinal: Soft nondistended nontender no rebound no guarding no  peritonitis no McBurney's tenderness negative Rovsing's no costovertebral tenderness negative Murphy's Musculoskeletal: No lower extremity edema   Neurologic:  Normal speech and language. No gross focal neurologic deficits are appreciated. Skin:  Skin is warm, dry and intact. No rash noted. Psychiatric: Mood and affect are normal. Speech and behavior are normal.    ____________________________________________   DIFFERENTIAL  Sinus tachycardia, sepsis, supraventricular tachycardia, AV nodal reentrant tachycardia, AV reentrant tachycardia, atrial fibrillation with rapid ventricular response, ventricular tachycardia, dehydration, neutropenic fever ____________________________________________   LABS (all labs ordered are listed, but only abnormal results are displayed)  Labs Reviewed - No data to display   __________________________________________  EKG  ED ECG REPORT I, Darel Hong, the attending physician, personally viewed and interpreted this ECG.  Date: 12/26/2016 Rate: 165 Rhythm: Supraventricular tachycardia QRS Axis: normal Intervals: normal ST/T Wave abnormalities: normal Conduction Disturbances: none Narrative Interpretation: Abnormal   ED ECG REPORT I, Darel Hong, the attending physician, personally viewed and interpreted this ECG.  Date: 12/26/2016 EKG Time:  Rate: 103 Rhythm: Sinus tachycardia QRS Axis: normal Intervals: normal ST/T Wave abnormalities: normal Conduction Disturbances: none Narrative Interpretation: unremarkable  ________________________________________  RADIOLOGY   ____________________________________________   PROCEDURES  Procedure(s) performed: no  Procedures  Critical Care performed: no  ____________________________________________   INITIAL IMPRESSION / ASSESSMENT AND PLAN / ED COURSE  Pertinent labs & imaging results that were available during my care of the patient were reviewed by me and considered in my  medical decision making (see chart for details).  By the time I saw the patient she was very well-appearing with heart rate of 101 bpm. Review of her previous EKG showed a supraventricular tachycardia at 165 and a resultant EKG shows a sinus tachycardia to 103 with clear P waves. The patient is afebrile and asymptomatic at this point.  1515: I discussed the case with Dr. Grayland Ormond of Oncology who said that the nurse is no longer at the [facility] and that if I feel the patient is medically stable we can give the granix here today and discharge her. The patient feels comfortable with this plan. She was observed on monitor over 1 hour with no recurrence of her symptoms. She is discharged home in improved and good condition.      ____________________________________________   FINAL CLINICAL IMPRESSION(S) / ED DIAGNOSES  Final diagnoses:  SVT (supraventricular tachycardia) (HCC)      NEW MEDICATIONS STARTED DURING THIS VISIT:  New Prescriptions   No  medications on file     Note:  This document was prepared using Dragon voice recognition software and may include unintentional dictation errors.     Darel Hong, MD 12/26/16 [phone]

## 2016-12-26 NOTE — ED Triage Notes (Signed)
Pt brought over from cancer center where she was supposed to get an injection for her WBC. When the nurse checked her vitals her HR was noted to be 170's. Pt was brought directly to room. Pt reports that she has been feeling weak and tired. Denies pain.

## 2016-12-26 NOTE — Discharge Instructions (Signed)
Please keep your appointment at the [facility] tomorrow for another dose of granix.  Come back to the ED for any new or worsening symptoms such as ANY FEVER WHATSOEVER, chest pain, palpitations, or for any other concerns.  It was a pleasure to take care of you today, and thank you for coming to our emergency department.  If you have any questions or concerns before leaving please ask the nurse to grab me and I'm more than happy to go through your aftercare instructions again.  If you were prescribed any opioid pain medication today such as Norco, Vicodin, Percocet, morphine, hydrocodone, or oxycodone please make sure you do not drive when you are taking this medication as it can alter your ability to drive safely.  If you have any concerns once you are home that you are not improving or are in fact getting worse before you can make it to your follow-up appointment, please do not hesitate to call 911 and come back for further evaluation.  Darel Hong MD

## 2016-12-27 ENCOUNTER — Encounter
Admission: RE | Admit: 2016-12-27 | Discharge: 2016-12-27 | Disposition: A | Discharge status: Home / Self Care | Payer: BLUE CROSS/BLUE SHIELD | Source: Ambulatory Visit | Attending: Internal Medicine | Admitting: Internal Medicine

## 2016-12-27 ENCOUNTER — Ambulatory Visit: Payer: BLUE CROSS/BLUE SHIELD

## 2016-12-27 VITALS — BP 108/71 | HR 121 | Temp 98.7°F | Resp 18

## 2016-12-27 DIAGNOSIS — Z170 Estrogen receptor positive status [ER+]: Secondary | ICD-10-CM | POA: Diagnosis not present

## 2016-12-27 DIAGNOSIS — C50412 Malignant neoplasm of upper-outer quadrant of left female breast: Secondary | ICD-10-CM | POA: Insufficient documentation

## 2016-12-27 MED ORDER — TBO-FILGRASTIM 480 MCG/0.8ML ~~LOC~~ SOSY
480.0000 ug | PREFILLED_SYRINGE | Freq: Once | SUBCUTANEOUS | Status: AC
  Administered 2016-12-27: 480 ug via SUBCUTANEOUS

## 2016-12-27 NOTE — Progress Notes (Signed)
Location:      Place of Service:  SNF (31)  Provider: Toni Arthurs, NP-C  PCP: Keith Rake, MD Patient Care Team: [name], MD as PCP - General (Family Medicine) [name], MD (General Surgery) [name], MD (Inactive) as Attending Physician (Internal Medicine)  Extended Emergency Contact Information Primary Emergency Contact: Gwenyth Ober Address: [address].          [place], [place] [address] [place] Phone: [phone] Relation: Brother Secondary Emergency Contact: Hubbard Hartshorn, [place] [address] [place]itter of [place] Phone: [phone] Relation: Brother  Code Status: full  Goals of care:  Advanced Directive information Advanced Directives 12/25/2016  Does Patient Have a Medical Advance Directive? Yes  Type of Advance Directive -  Does patient want to make changes to medical advance directive? -  Copy of [place] in Chart? -  Would patient like information on creating a medical advance directive? -     Allergies  Allergen Reactions  . No Known Allergies     Chief Complaint  Patient presents with  . Discharge Note    HPI:  61 y.o. female seen today for discharge evaluation. Pt was admitted to the facility for rehab following admission to [facility] for peripheral neuropathy due to chemotherapy, weakness and deconditioning. Pt participated in physical and occupational therapies. She tolerated them well. Pt reports pain is well controlled. Rates pain 2/10. She has tolerable numbness of her hands. Baseline edema of BLE. Pt has began having decreased appetite in the past few day. Her pulse has also began to climb. Pt reports she is going for a chemo treatment as soon as she is discharged from the facility and will follow up with her oncologist then about the tachycardia and lab results. Otherwise, pt reports she is feeling well and ready to go home. VSS. No other complaints.     Past  Medical History:  Diagnosis Date  . Breast cancer ([facility]) 2016   Left-radiation  . Cancer ([facility]) 2016   Stage II, ER positive, PR positive, HER-2/neu overexpressing of the left breast.  . Chemotherapy-induced peripheral neuropathy ([place])   . Heart attack 1998  . Hyperlipidemia   . Hypertension   . Neuromuscular disorder (HCC)    neuropathy  . Neuropathy [facility])     Past Surgical History:  Procedure Laterality Date  . BREAST BIOPSY Left 2016   Positive  . BREAST LUMPECTOMY WITH SENTINEL LYMPH NODE BIOPSY Left 05/23/2015   Procedure: LEFT BREAST WIDE EXCISION WITH AXILLARY DISSECTION, MASTOPLASTY ;  Surgeon: [name], MD;  Location: [facility] ORS;  Service: General;  Laterality: Left;  . BREAST SURGERY Left 12/18/14   breast biopsy/INVASIVE DUCTAL CARCINOMA OF BREAST, NOTTINGHAM GRADE 2.  Marland Kitchen BREAST SURGERY  05/23/2015.   Wide excision/mastoplasty, axillary dissection. No residual invasive cancer, positive for residual DCIS. 0/2 nodes identified on axillary dissection. (no SLN by technetium or methylene blue)  . CARDIAC CATHETERIZATION    . COLONOSCOPY WITH PROPOFOL N/A 12/10/2016   Procedure: COLONOSCOPY WITH PROPOFOL;  Surgeon: Lucilla Lame, MD;  Location: [facility] ENDOSCOPY;  Service: Endoscopy;  Laterality: N/A;  . ESOPHAGOGASTRODUODENOSCOPY (EGD) WITH PROPOFOL N/A 12/08/2016   Procedure: ESOPHAGOGASTRODUODENOSCOPY (EGD) WITH PROPOFOL;  Surgeon: Lucilla Lame, MD;  Location: [facility] ENDOSCOPY;  Service: Endoscopy;  Laterality: N/A;  . PORTACATH PLACEMENT Right 12-31-14   Dr Bary Castilla      reports that she has been smoking Cigarettes.  She has a 9.00 pack-year smoking history. She has never used smokeless tobacco. She reports that she does not drink alcohol or use drugs. Social History   Social History  . Marital status: Divorced    Spouse name: N/A  . Number of children: N/A  . Years of education: N/A   Occupational History  . Not on file.   Social History Main Topics  . Smoking status:  Light Tobacco Smoker    Packs/day: 0.50    Years: 18.00    Types: Cigarettes  . Smokeless tobacco: Never Used  . Alcohol use No  . Drug use: No  . Sexual activity: Not on file   Other Topics Concern  . Not on file   Social History Narrative  . No narrative on file   Functional Status Survey:    Allergies  Allergen Reactions  . No Known Allergies     Pertinent  Health Maintenance Due  Topic Date Due  . PAP SMEAR  01/06/1975  . INFLUENZA VACCINE  07/27/2017 (Originally 04/28/2017)  . MAMMOGRAM  01/28/2018  . COLONOSCOPY  12/11/2026    Medications: Allergies as of 12/24/2016      Reactions   No Known Allergies       Medication List       Accurate as of 12/24/16 11:59 PM. Always use your most recent med list.          anastrozole 1 MG tablet Commonly known as:  ARIMIDEX TAKE 1 TABLET DAILY   aspirin 81 MG tablet Take 81 mg by mouth daily.   carvedilol 6.25 MG tablet Commonly known as:  COREG Take 1 tablet (6.25 mg total) by mouth 2 (two) times daily.   clopidogrel 75 MG tablet Commonly known as:  PLAVIX Take 1 tablet (75 mg total) by mouth daily.   diphenoxylate-atropine 2.5-0.025 MG tablet Commonly known as:  LOMOTIL Take 1 tablet by mouth 4 (four) times daily as needed for diarrhea or loose stools. Take it along with immodium   DULoxetine 60 MG capsule Commonly known as:  CYMBALTA TAKE 1 CAPSULE (60 MG TOTAL) BY MOUTH DAILY.   feeding supplement (ENSURE ENLIVE) Liqd Take 237 mLs by mouth 2 (two) times daily between meals.   ondansetron 8 MG tablet Commonly known as:  ZOFRAN Take 1 tablet (8 mg total) by mouth every 8 (eight) hours as needed for nausea or vomiting (start 3 days; after chemo).   pantoprazole 40 MG tablet Commonly known as:  PROTONIX Take 1 tablet (40 mg total) by mouth 2 (two) times daily.   polyethylene glycol packet Commonly known as:  MIRALAX / GLYCOLAX Take 17 g by mouth daily.   prochlorperazine 10 MG tablet Commonly  known as:  COMPAZINE Take 1 tablet (10 mg total) by mouth every 6 (six) hours as needed for nausea or vomiting.   senna-docusate 8.6-50 MG tablet Commonly known as:  Senokot-S Take 1 tablet by mouth 2 (two) times daily.       Review of Systems  Constitutional: Negative for activity change, appetite change, chills, diaphoresis and fever.  HENT: Negative for congestion, sneezing, sore throat, trouble swallowing and voice change.   Respiratory: Negative for apnea, cough, choking, chest tightness, shortness of breath and wheezing.   Cardiovascular: Negative for chest pain, palpitations and leg swelling.  Gastrointestinal: Negative for abdominal distention, abdominal pain, constipation, diarrhea and nausea.  Genitourinary: Negative for difficulty urinating, dysuria, frequency and urgency.  Musculoskeletal: Positive for arthralgias (typical arthritis). Negative for back pain,  gait problem and myalgias.  Skin: Negative for color change, pallor, rash and wound.  Neurological: Positive for numbness. Negative for dizziness, tremors, syncope, speech difficulty, weakness and headaches.  Psychiatric/Behavioral: Negative for agitation and behavioral problems.  All other systems reviewed and are negative.   Vitals:   12/24/16 0530  BP: 128/83  Pulse: 97  Resp: 18  Temp: 97.7 F (36.5 C)  SpO2: 95%   There is no height or weight on file to calculate BMI. Physical Exam  Constitutional: She is oriented to person, place, and time. Vital signs are normal. She appears well-developed and well-nourished. She is active and cooperative. She does not appear ill. No distress.  HENT:  Head: Normocephalic and atraumatic.  Mouth/Throat: Uvula is midline, oropharynx is clear and moist and mucous membranes are normal. Mucous membranes are not pale, not dry and not cyanotic.  Eyes: Conjunctivae, EOM and lids are normal. Pupils are equal, round, and reactive to light.  Neck: Trachea normal, normal range of  motion and full passive range of motion without pain. Neck supple. No JVD present. No tracheal deviation, no edema and no erythema present. No thyromegaly present.  Cardiovascular: Regular rhythm, normal heart sounds, intact distal pulses and normal pulses.  Tachycardia present.  Exam reveals no gallop, no distant heart sounds and no friction rub.   No murmur heard. Pulses:      Dorsalis pedis pulses are 2+ on the right side, and 2+ on the left side.  Pulmonary/Chest: Effort normal and breath sounds normal. No accessory muscle usage. No respiratory distress. She has no decreased breath sounds. She has no wheezes. She has no rhonchi. She has no rales. She exhibits no tenderness.  Abdominal: Normal appearance and bowel sounds are normal. She exhibits no distension and no ascites. There is no tenderness.  Musculoskeletal: Normal range of motion. She exhibits no edema or tenderness.  Expected osteoarthritis, stiffness  Neurological: She is alert and oriented to person, place, and time. She has normal strength. A sensory deficit is present.  Skin: Skin is warm, dry and intact. No rash noted. She is not diaphoretic. No cyanosis or erythema. No pallor. Nails show no clubbing.  Psychiatric: She has a normal mood and affect. Her speech is normal and behavior is normal. Judgment and thought content normal. Cognition and memory are normal.  Nursing note and vitals reviewed.   Labs reviewed: Basic Metabolic Panel:  Recent Labs  12/17/16 0834 12/25/16 0818 12/25/16 1715  NA 136 131* 135  K 3.4* 3.6 3.5  CL 99* 94* 98*  CO2 32 27 27  GLUCOSE 87 100* 60*  BUN 12 10 8   CREATININE 0.52 0.45 0.45  CALCIUM 8.2* 8.4* 8.1*   Liver Function Tests:  Recent Labs  12/17/16 0834 12/25/16 0818 12/25/16 1715  AST 117* 129* 118*  ALT 207* 93* 88*  ALKPHOS 394* 349* 343*  BILITOT 1.2 1.5* 1.2  PROT 5.7* 6.6 6.0*  ALBUMIN 2.3* 2.5* 2.4*   No results for input(s): LIPASE, AMYLASE in the last 8760  hours.  Recent Labs  11/10/16 0841  AMMONIA 16   CBC:  Recent Labs  12/17/16 0834 12/25/16 0818 12/25/16 1715  WBC 14.7* 0.6* 0.6*  NEUTROABS 13.1* 0.1* 0.1*  HGB 9.6* 9.7* 9.4*  HCT 28.3* 28.1* 28.1*  MCV 88.3 87.6 89.8  PLT 251 163 160   Cardiac Enzymes:  Recent Labs  11/10/16 0749  TROPONINI <0.03   BNP: Invalid input(s): POCBNP CBG:  Recent Labs  12/07/16 1031 12/08/16  2458 12/08/16 1608  GLUCAP 142* 84 133*    Procedures and Imaging Studies During Stay: Dg Abd 1 View  Result Date: 12/07/2016 CLINICAL DATA:  Status post NG tube placement. EXAM: ABDOMEN - 1 VIEW COMPARISON:  CT scan of the abdomen and pelvis of December 04, 2016 FINDINGS: The tip of the nasogastric tube lies in the proximal gastric body. The proximal port lies at or just below the level of the GE junction. IMPRESSION: Advancement of the nasogastric tube by 5-10 cm would be useful to assure that the proximal port remains below the GE junction. Electronically Signed   By: David  Martinique M.D.   On: 12/07/2016 11:18   Nm Gi Blood Loss  Result Date: 12/07/2016 CLINICAL DATA:  Gastrointestinal bleeding and hypotension.  Anemia. EXAM: NUCLEAR MEDICINE GASTROINTESTINAL BLEEDING SCAN TECHNIQUE: Sequential abdominal images were obtained following intravenous administration of Tc-52mlabeled red blood cells. RADIOPHARMACEUTICALS:  21.7 mCi Tc-951mn-vitro labeled red cells. COMPARISON:  12/04/2016 FINDINGS: In the first hour of imaging, no accumulation over the abdomen is identified to suggest gastrointestinal bleed. Radiopharmaceutical visible in the blood pool. In the second hour of imaging, once again there was no active bleeding observed. There is some bladder activity in the second are which could be related to excretion of free technetium. Also correlate with any hematuria. IMPRESSION: 1. Over 2 hours of imaging, no active gastrointestinal bleed is visible. 2. Accumulation of activity in the urinary bladder  may be from excretion of free technetium, also correlate with any hematuria. Electronically Signed   By: WaVan Clines.D.   On: 12/07/2016 18:41   Ct Angio Aortobifemoral W And/or Wo Contrast  Result Date: 12/05/2016 CLINICAL DATA:  Pt has history of metastatic breast cancer to brain and bone presenting with worsening bitlateral leg pain and numbness. [facility] for further evaluation for poor pulses. Pt lower legs cold to touch bilaterally. Is clip EXAM: CT ANGIOGRAPHY OF ABDOMINAL AORTA WITH ILIOFEMORAL RUNOFF TECHNIQUE: Multidetector CT imaging of the abdomen, pelvis and lower extremities was performed using the standard protocol during bolus administration of intravenous contrast. Multiplanar CT image reconstructions and MIPs were obtained to evaluate the vascular anatomy. CONTRAST:  125 mL Isovue 370 IV COMPARISON:  11/02/2016 FINDINGS: VASCULAR Aorta: Mild atheromatous plaque. No aneurysm, dissection, or stenosis. Celiac:Partially calcified ostial plaque resulting in short segment stenosis of at least mild severity, patent distally. SMA: Patent without evidence of aneurysm, dissection, vasculitis or significant stenosis. Renals: Both renal arteries are patent without evidence of aneurysm, dissection, vasculitis, fibromuscular dysplasia or significant stenosis. IMA: Diminutive, patent RIGHT Lower Extremity Inflow: Minimal plaque at the right common iliac origin without high-grade stenosis, patent distally Outflow: Common, superficial and profunda femoral arteries and the popliteal artery are patent without evidence of aneurysm, dissection, vasculitis or significant stenosis. Runoff: Posterior tibial non-opacified in the proximal calf. Peroneal non-opacified mid calf. Anterior tibial artery non-opacified below distal calf. No collateral reconstitution of distal named vessels although this may be simply secondary to early scan timing. LEFT Lower Extremity Inflow:  Nonocclusive plaque at the origin of the left common iliac and its bifurcation. Otherwise patent. Outflow: Common, superficial and profunda femoral arteries and the popliteal artery are patent without evidence of aneurysm, dissection, vasculitis or significant stenosis. Runoff: Posterior tibial artery not seen below mid calf. Peroneal artery send a distal calf. Anterior tibial artery is non-opacified above the ankle. Veins: Dedicated venous phase imaging not obtained. Review of the MIP  images confirms the above findings. NON-VASCULAR Lower chest: No acute abnormality. Hepatobiliary: Innumerable low-attenuation lesions throughout the liver consistent metastatic disease as noted on previous scan, without convincing interval change. Gallbladder nondilated. No definite biliary ductal dilatation identified. Pancreas: Unremarkable. No pancreatic ductal dilatation or surrounding inflammatory changes. Spleen: Normal in size without focal abnormality. Adrenals/Urinary Tract: Adrenal glands are unremarkable. Kidneys are normal, without renal calculi, focal lesion, or hydronephrosis. Bladder is unremarkable. Stomach/Bowel: Stomach is within normal limits. Appendix not discretely identified. No evidence of bowel wall thickening, distention, or inflammatory changes. Lymphatic: No adenopathy identified Reproductive: Coarse calcifications in degenerated uterine fibroids. No adnexal mass. Other: No ascites.  No free air. Musculoskeletal: Sclerotic T12 vertebral body lesion stable since most recent previous scan, new since 01/04/2015. Sclerotic inferior right pubic ramus lesion stable. Mild degenerative change in bilateral hips. No fracture or additional worrisome bone lesion. IMPRESSION: VASCULAR 1. No significant aortoiliac or femoro-popliteal arterial occlusive disease bilaterally. 2. Incomplete opacification of distal tibial arterial runoff bilaterally. Suspect this is secondary to early scan timing given the lack of any  opacification of collateral channels. Acute embolic disease could have a similar appearance. Atheromatous obstructive disease is considered less likely. NON-VASCULAR 1. No acute findings. 2. Liver and bone metastases as previously noted. Electronically Signed   By: Lucrezia Europe M.D.   On: 12/05/2016 08:47   Dg Abd Portable 1v  Result Date: 12/07/2016 CLINICAL DATA:  NG tube placement. EXAM: PORTABLE ABDOMEN - 1 VIEW 11:57 a.m. COMPARISON:  12/07/2016 at 10:56 a.m. FINDINGS: NG tube has been advanced and the tip is now in the distal body of the stomach. The bowel gas pattern is normal.  No visible free air or free fluid. IMPRESSION: NG tube tip is in the distal body of the stomach. Electronically Signed   By: Lorriane Shire M.D.   On: 12/07/2016 12:23    Assessment/Plan:   1. Peripheral neuropathy due to chemotherapy (HCC)  Continue HH PT/OT  Continue exercises as taught by PT/OT  Follow up with oncologist asap about latest lab results  Encourage po fluids  Nursing to forward test results to Oncology  Follow up with oncology for ongoing chemo treatments   Patient is being discharged with the following home health services:  HHPT/OT/NSG  Patient is being discharged with the following durable medical equipment: BSC, RW,  Tub-Bench  Patient has been advised to f/u with their PCP in 1-2 weeks to bring them up to date on their rehab stay.  Social services at facility was responsible for arranging this appointment.  Pt was provided with a 30 day supply of prescriptions for medications and refills must be obtained from their PCP.  For controlled substances, a more limited supply may be provided adequate until PCP appointment only.  Future labs/tests needed:    Family/ staff Communication:   Total Time:  Documentation:  Face to Face:  Family/Phone:  Vikki Ports, NP-C [facility] [address], [place] [address] Cell Phone  (Mon-Fri 8am-5pm):  [phone] On Call:  [phone] & follow prompts after 5pm & weekends Office Phone:  [phone] Office Fax:  [phone]

## 2016-12-28 ENCOUNTER — Ambulatory Visit: Payer: BLUE CROSS/BLUE SHIELD | Admitting: Radiation Oncology

## 2016-12-28 ENCOUNTER — Encounter: Payer: Self-pay | Admitting: Radiation Oncology

## 2016-12-28 ENCOUNTER — Ambulatory Visit
Admission: RE | Admit: 2016-12-28 | Discharge: 2016-12-28 | Disposition: A | Discharge status: Home / Self Care | Payer: BLUE CROSS/BLUE SHIELD | Source: Ambulatory Visit | Attending: Radiation Oncology | Admitting: Radiation Oncology

## 2016-12-28 ENCOUNTER — Other Ambulatory Visit: Payer: Self-pay | Admitting: Family Medicine

## 2016-12-28 ENCOUNTER — Inpatient Hospital Stay: Payer: BLUE CROSS/BLUE SHIELD | Attending: Internal Medicine

## 2016-12-28 VITALS — BP 116/72 | HR 115 | Temp 98.0°F | Resp 18

## 2016-12-28 VITALS — BP 108/72 | HR 114 | Temp 98.7°F | Resp 18

## 2016-12-28 DIAGNOSIS — B370 Candidal stomatitis: Secondary | ICD-10-CM | POA: Diagnosis not present

## 2016-12-28 DIAGNOSIS — Z7689 Persons encountering health services in other specified circumstances: Secondary | ICD-10-CM | POA: Diagnosis not present

## 2016-12-28 DIAGNOSIS — C787 Secondary malignant neoplasm of liver and intrahepatic bile duct: Secondary | ICD-10-CM | POA: Diagnosis not present

## 2016-12-28 DIAGNOSIS — I998 Other disorder of circulatory system: Secondary | ICD-10-CM | POA: Insufficient documentation

## 2016-12-28 DIAGNOSIS — G629 Polyneuropathy, unspecified: Secondary | ICD-10-CM | POA: Diagnosis not present

## 2016-12-28 DIAGNOSIS — Z79899 Other long term (current) drug therapy: Secondary | ICD-10-CM | POA: Diagnosis not present

## 2016-12-28 DIAGNOSIS — M7989 Other specified soft tissue disorders: Secondary | ICD-10-CM | POA: Diagnosis not present

## 2016-12-28 DIAGNOSIS — I10 Essential (primary) hypertension: Secondary | ICD-10-CM | POA: Diagnosis not present

## 2016-12-28 DIAGNOSIS — Z79811 Long term (current) use of aromatase inhibitors: Secondary | ICD-10-CM | POA: Diagnosis not present

## 2016-12-28 DIAGNOSIS — Z9221 Personal history of antineoplastic chemotherapy: Secondary | ICD-10-CM | POA: Diagnosis not present

## 2016-12-28 DIAGNOSIS — R7989 Other specified abnormal findings of blood chemistry: Secondary | ICD-10-CM | POA: Diagnosis not present

## 2016-12-28 DIAGNOSIS — D709 Neutropenia, unspecified: Secondary | ICD-10-CM | POA: Insufficient documentation

## 2016-12-28 DIAGNOSIS — I252 Old myocardial infarction: Secondary | ICD-10-CM | POA: Insufficient documentation

## 2016-12-28 DIAGNOSIS — E785 Hyperlipidemia, unspecified: Secondary | ICD-10-CM | POA: Diagnosis not present

## 2016-12-28 DIAGNOSIS — C50412 Malignant neoplasm of upper-outer quadrant of left female breast: Secondary | ICD-10-CM | POA: Diagnosis not present

## 2016-12-28 DIAGNOSIS — Z7982 Long term (current) use of aspirin: Secondary | ICD-10-CM | POA: Diagnosis not present

## 2016-12-28 DIAGNOSIS — M8580 Other specified disorders of bone density and structure, unspecified site: Secondary | ICD-10-CM | POA: Insufficient documentation

## 2016-12-28 DIAGNOSIS — Z803 Family history of malignant neoplasm of breast: Secondary | ICD-10-CM | POA: Insufficient documentation

## 2016-12-28 DIAGNOSIS — C7931 Secondary malignant neoplasm of brain: Secondary | ICD-10-CM | POA: Diagnosis not present

## 2016-12-28 DIAGNOSIS — Z5111 Encounter for antineoplastic chemotherapy: Secondary | ICD-10-CM | POA: Diagnosis not present

## 2016-12-28 DIAGNOSIS — Z923 Personal history of irradiation: Secondary | ICD-10-CM | POA: Insufficient documentation

## 2016-12-28 DIAGNOSIS — Z808 Family history of malignant neoplasm of other organs or systems: Secondary | ICD-10-CM | POA: Diagnosis not present

## 2016-12-28 DIAGNOSIS — F17210 Nicotine dependence, cigarettes, uncomplicated: Secondary | ICD-10-CM | POA: Diagnosis not present

## 2016-12-28 DIAGNOSIS — Z170 Estrogen receptor positive status [ER+]: Secondary | ICD-10-CM | POA: Insufficient documentation

## 2016-12-28 DIAGNOSIS — E860 Dehydration: Secondary | ICD-10-CM

## 2016-12-28 MED ORDER — TBO-FILGRASTIM 480 MCG/0.8ML ~~LOC~~ SOSY
480.0000 ug | PREFILLED_SYRINGE | Freq: Once | SUBCUTANEOUS | Status: AC
  Administered 2016-12-28: 480 ug via SUBCUTANEOUS
  Filled 2016-12-28: qty 0.8

## 2016-12-28 NOTE — Progress Notes (Signed)
Radiation Oncology Follow up Note  Name: Sarah Horne   Date:   12/28/2016 MRN:  [id number] DOB: 1956/03/06    This 61 y.o. female presents to the clinic today for one-month follow-up status post revision therapy to her brain for stage IV breast cancer.  REFERRING PROVIDER: [name], MD  HPI: Patient is a 61 year old female previously treated for stage II triple positive invasive mammary carcinoma left breast status post neoadjuvant chemotherapy plus Herceptin and received postoperative whole breast and peripheral lymphatic radiation. She does have multiple liver metastasis and also had an MRI scan showing left temporal solitary lesion compatible with brain metastasis.. She received radiation therapy to a solitary brain metastasis 3000 cGy in 5 fractions. Seen today one month out she is doing fairly well. She's having no headaches or change in neurologic status. Still weak in her lower extremities making ambulation difficult. No change in visual fields. She is currently onEribulin which is causing some neutropenia. She does have acute vascular insufficiency of bilateral lower extremities she's currently on aspirin.  COMPLICATIONS OF TREATMENT: none  FOLLOW UP COMPLIANCE: keeps appointments   PHYSICAL EXAM:  There were no vitals taken for this visit. Wheelchair-bound female in NAD. She has significant decreased strength in her lower extremities no sensory levels motor sensory levels are upper extremities are equal. Cranial nerves are intact. No oral candidiasis is noted. Well-developed well-nourished patient in NAD. HEENT reveals PERLA, EOMI, discs not visualized.  Oral cavity is clear. No oral mucosal lesions are identified. Neck is clear without evidence of cervical or supraclavicular adenopathy. Lungs are clear to A&P. Cardiac examination is essentially unremarkable with regular rate and rhythm without murmur rub or thrill. Abdomen is benign with no organomegaly or masses noted. Motor  sensory and DTR levels are equal and symmetric in the CT angiogram of her lower extremities is reviewed upper and lower extremities. Cranial nerves II through XII are grossly intact. Proprioception is intact. No peripheral adenopathy or edema is identified. No motor or sensory levels are noted. Crude visual fields are within normal range.  RADIOLOGY RESULTS: CT angiogram of her lower extremities is reviewed  PLAN: Present time she is clinically stable she continues systemic treatment with medical oncology. At this time I'm going to turn follow-up care over to medical oncology. Would be happy to reevaluate the patient any time should further palliative treatment be indicated.  I would like to take this opportunity to thank you for allowing me to participate in the care of your patient.Armstead Peaks., MD

## 2016-12-29 ENCOUNTER — Inpatient Hospital Stay: Payer: BLUE CROSS/BLUE SHIELD

## 2016-12-29 VITALS — BP 99/69 | HR 121

## 2016-12-29 DIAGNOSIS — Z170 Estrogen receptor positive status [ER+]: Secondary | ICD-10-CM | POA: Diagnosis not present

## 2016-12-29 DIAGNOSIS — Z7689 Persons encountering health services in other specified circumstances: Secondary | ICD-10-CM | POA: Diagnosis not present

## 2016-12-29 DIAGNOSIS — R7989 Other specified abnormal findings of blood chemistry: Secondary | ICD-10-CM | POA: Diagnosis not present

## 2016-12-29 DIAGNOSIS — G629 Polyneuropathy, unspecified: Secondary | ICD-10-CM | POA: Diagnosis not present

## 2016-12-29 DIAGNOSIS — M7989 Other specified soft tissue disorders: Secondary | ICD-10-CM | POA: Diagnosis not present

## 2016-12-29 DIAGNOSIS — F17210 Nicotine dependence, cigarettes, uncomplicated: Secondary | ICD-10-CM | POA: Diagnosis not present

## 2016-12-29 DIAGNOSIS — I10 Essential (primary) hypertension: Secondary | ICD-10-CM | POA: Diagnosis not present

## 2016-12-29 DIAGNOSIS — C7931 Secondary malignant neoplasm of brain: Secondary | ICD-10-CM | POA: Diagnosis not present

## 2016-12-29 DIAGNOSIS — I252 Old myocardial infarction: Secondary | ICD-10-CM | POA: Diagnosis not present

## 2016-12-29 DIAGNOSIS — Z5111 Encounter for antineoplastic chemotherapy: Secondary | ICD-10-CM | POA: Diagnosis not present

## 2016-12-29 DIAGNOSIS — E860 Dehydration: Secondary | ICD-10-CM

## 2016-12-29 DIAGNOSIS — B370 Candidal stomatitis: Secondary | ICD-10-CM | POA: Diagnosis not present

## 2016-12-29 DIAGNOSIS — C50412 Malignant neoplasm of upper-outer quadrant of left female breast: Secondary | ICD-10-CM | POA: Diagnosis not present

## 2016-12-29 DIAGNOSIS — D709 Neutropenia, unspecified: Secondary | ICD-10-CM | POA: Diagnosis not present

## 2016-12-29 DIAGNOSIS — Z7982 Long term (current) use of aspirin: Secondary | ICD-10-CM | POA: Diagnosis not present

## 2016-12-29 DIAGNOSIS — M8580 Other specified disorders of bone density and structure, unspecified site: Secondary | ICD-10-CM | POA: Diagnosis not present

## 2016-12-29 DIAGNOSIS — E785 Hyperlipidemia, unspecified: Secondary | ICD-10-CM | POA: Diagnosis not present

## 2016-12-29 MED ORDER — TBO-FILGRASTIM 480 MCG/0.8ML ~~LOC~~ SOSY
480.0000 ug | PREFILLED_SYRINGE | Freq: Once | SUBCUTANEOUS | Status: AC
  Administered 2016-12-29: 480 ug via SUBCUTANEOUS
  Filled 2016-12-29: qty 0.8

## 2016-12-29 NOTE — Progress Notes (Signed)
Patient's heart rate is 121 on check today and she states that she is going to call her cardiologist, Dr. Fletcher Anon, today.

## 2016-12-30 ENCOUNTER — Other Ambulatory Visit: Payer: Self-pay

## 2016-12-30 ENCOUNTER — Telehealth: Payer: Self-pay | Admitting: Cardiovascular Disease

## 2016-12-30 ENCOUNTER — Telehealth: Payer: Self-pay | Admitting: *Deleted

## 2016-12-30 DIAGNOSIS — I10 Essential (primary) hypertension: Secondary | ICD-10-CM

## 2016-12-30 DIAGNOSIS — F17210 Nicotine dependence, cigarettes, uncomplicated: Secondary | ICD-10-CM | POA: Diagnosis not present

## 2016-12-30 DIAGNOSIS — G620 Drug-induced polyneuropathy: Secondary | ICD-10-CM | POA: Diagnosis not present

## 2016-12-30 DIAGNOSIS — Z7982 Long term (current) use of aspirin: Secondary | ICD-10-CM | POA: Diagnosis not present

## 2016-12-30 DIAGNOSIS — C50912 Malignant neoplasm of unspecified site of left female breast: Secondary | ICD-10-CM | POA: Diagnosis not present

## 2016-12-30 DIAGNOSIS — C787 Secondary malignant neoplasm of liver and intrahepatic bile duct: Secondary | ICD-10-CM | POA: Diagnosis not present

## 2016-12-30 DIAGNOSIS — K270 Acute peptic ulcer, site unspecified, with hemorrhage: Secondary | ICD-10-CM | POA: Diagnosis not present

## 2016-12-30 DIAGNOSIS — C7951 Secondary malignant neoplasm of bone: Secondary | ICD-10-CM | POA: Diagnosis not present

## 2016-12-30 DIAGNOSIS — C7931 Secondary malignant neoplasm of brain: Secondary | ICD-10-CM | POA: Diagnosis not present

## 2016-12-30 MED ORDER — CARVEDILOL 6.25 MG PO TABS: 6.2500 mg | ORAL_TABLET | Freq: Two times a day (BID) | ORAL | 0 refills | Status: DC

## 2016-12-30 NOTE — Telephone Encounter (Signed)
-----   Message from [place] sent at 12/30/2016  1:59 PM EDT ----- Regarding: meds Contact: [phone] Lindsey-caregiver needs to know about some meds that this patient is on.

## 2016-12-30 NOTE — Telephone Encounter (Signed)
Pt reports BP and HR has been "up and down" recently. She went to the ED on 3/31 from the cancer center d/t elevated HR in 170s.  She was treated and d/c'd home. She does not check BP QD but reports today BP 118/79 HR 124 Coreg was held during March hospital admission d/t hypotension but she was instructed to resume at discharge. She was in rehab for two weeks and was not given coreg  She took one dose April 2 and has not taken it since. States she has stopped lisinopril. Advised pt to resume coreg BID as prescribed. She will monitor BP and HR prior to taking medication. Reviewed parameters in which she would hold coreg.  She will take a dose now and understands to proceed to ED if HR is not improved.  Pt verbalized understanding with no further questions at this time. Transferred to scheduling for an appointment as she is past due.  Medication list updated.

## 2016-12-30 NOTE — Telephone Encounter (Signed)
Returned Lindsey's call [phone]). Left vm that 1) Call was being returned 2). I need to know the relationship of the caregiver to the patient. As 'Mendel Ryder' is "not on the list to talk to."

## 2016-12-30 NOTE — Telephone Encounter (Signed)
Patient wants to be seen asap for bp and hr issues.  HR has been running in the 170's .  Please call.  Dr. Fletcher Anon next available appt end of may patient does not want to wait wants to be seen tomorrow.

## 2016-12-31 ENCOUNTER — Ambulatory Visit: Payer: BLUE CROSS/BLUE SHIELD | Admitting: Gastroenterology

## 2016-12-31 ENCOUNTER — Ambulatory Visit: Payer: BLUE CROSS/BLUE SHIELD | Admitting: Internal Medicine

## 2016-12-31 DIAGNOSIS — C7931 Secondary malignant neoplasm of brain: Secondary | ICD-10-CM | POA: Diagnosis not present

## 2016-12-31 DIAGNOSIS — Z7982 Long term (current) use of aspirin: Secondary | ICD-10-CM | POA: Diagnosis not present

## 2016-12-31 DIAGNOSIS — C50912 Malignant neoplasm of unspecified site of left female breast: Secondary | ICD-10-CM | POA: Diagnosis not present

## 2016-12-31 DIAGNOSIS — C787 Secondary malignant neoplasm of liver and intrahepatic bile duct: Secondary | ICD-10-CM | POA: Diagnosis not present

## 2016-12-31 DIAGNOSIS — C7951 Secondary malignant neoplasm of bone: Secondary | ICD-10-CM | POA: Diagnosis not present

## 2016-12-31 DIAGNOSIS — K270 Acute peptic ulcer, site unspecified, with hemorrhage: Secondary | ICD-10-CM | POA: Diagnosis not present

## 2016-12-31 DIAGNOSIS — I10 Essential (primary) hypertension: Secondary | ICD-10-CM | POA: Diagnosis not present

## 2016-12-31 DIAGNOSIS — F17210 Nicotine dependence, cigarettes, uncomplicated: Secondary | ICD-10-CM | POA: Diagnosis not present

## 2016-12-31 DIAGNOSIS — G620 Drug-induced polyneuropathy: Secondary | ICD-10-CM | POA: Diagnosis not present

## 2016-12-31 NOTE — Telephone Encounter (Signed)
Called pt to check on her HR. She reports taking coreg last night as instructed. She did not recheck BP or HR. I asked her to check VS while on the phone. BP 107/66 HR 108. She understands to check BP and HR prior to each dose of coreg and understands parameters in which she would hold medication. She will call if BP continuously runs low and she holds coreg. Pt wrote down instructions, verbalized understanding with no further questions at this time.

## 2017-01-01 ENCOUNTER — Ambulatory Visit: Payer: BLUE CROSS/BLUE SHIELD | Admitting: Internal Medicine

## 2017-01-01 ENCOUNTER — Inpatient Hospital Stay: Payer: BLUE CROSS/BLUE SHIELD

## 2017-01-01 ENCOUNTER — Ambulatory Visit: Payer: BLUE CROSS/BLUE SHIELD

## 2017-01-01 ENCOUNTER — Inpatient Hospital Stay (HOSPITAL_BASED_OUTPATIENT_CLINIC_OR_DEPARTMENT_OTHER): Payer: BLUE CROSS/BLUE SHIELD | Admitting: Internal Medicine

## 2017-01-01 ENCOUNTER — Other Ambulatory Visit: Payer: BLUE CROSS/BLUE SHIELD

## 2017-01-01 VITALS — BP 118/80 | HR 99 | Temp 96.3°F | Resp 18 | Wt 148.5 lb

## 2017-01-01 DIAGNOSIS — F17210 Nicotine dependence, cigarettes, uncomplicated: Secondary | ICD-10-CM

## 2017-01-01 DIAGNOSIS — Z7689 Persons encountering health services in other specified circumstances: Secondary | ICD-10-CM | POA: Diagnosis not present

## 2017-01-01 DIAGNOSIS — Z7982 Long term (current) use of aspirin: Secondary | ICD-10-CM | POA: Diagnosis not present

## 2017-01-01 DIAGNOSIS — D709 Neutropenia, unspecified: Secondary | ICD-10-CM | POA: Diagnosis not present

## 2017-01-01 DIAGNOSIS — M8580 Other specified disorders of bone density and structure, unspecified site: Secondary | ICD-10-CM | POA: Diagnosis not present

## 2017-01-01 DIAGNOSIS — Z170 Estrogen receptor positive status [ER+]: Secondary | ICD-10-CM | POA: Diagnosis not present

## 2017-01-01 DIAGNOSIS — I252 Old myocardial infarction: Secondary | ICD-10-CM

## 2017-01-01 DIAGNOSIS — C50412 Malignant neoplasm of upper-outer quadrant of left female breast: Secondary | ICD-10-CM | POA: Diagnosis not present

## 2017-01-01 DIAGNOSIS — M7989 Other specified soft tissue disorders: Secondary | ICD-10-CM

## 2017-01-01 DIAGNOSIS — C801 Malignant (primary) neoplasm, unspecified: Secondary | ICD-10-CM

## 2017-01-01 DIAGNOSIS — Z803 Family history of malignant neoplasm of breast: Secondary | ICD-10-CM

## 2017-01-01 DIAGNOSIS — Z79899 Other long term (current) drug therapy: Secondary | ICD-10-CM

## 2017-01-01 DIAGNOSIS — E785 Hyperlipidemia, unspecified: Secondary | ICD-10-CM

## 2017-01-01 DIAGNOSIS — I10 Essential (primary) hypertension: Secondary | ICD-10-CM

## 2017-01-01 DIAGNOSIS — G629 Polyneuropathy, unspecified: Secondary | ICD-10-CM | POA: Diagnosis not present

## 2017-01-01 DIAGNOSIS — Z5111 Encounter for antineoplastic chemotherapy: Secondary | ICD-10-CM | POA: Diagnosis not present

## 2017-01-01 DIAGNOSIS — C7931 Secondary malignant neoplasm of brain: Secondary | ICD-10-CM

## 2017-01-01 DIAGNOSIS — R7989 Other specified abnormal findings of blood chemistry: Secondary | ICD-10-CM | POA: Diagnosis not present

## 2017-01-01 DIAGNOSIS — B370 Candidal stomatitis: Secondary | ICD-10-CM | POA: Diagnosis not present

## 2017-01-01 DIAGNOSIS — Z808 Family history of malignant neoplasm of other organs or systems: Secondary | ICD-10-CM

## 2017-01-01 LAB — CBC WITH DIFFERENTIAL/PLATELET
BASOS PCT: 1 %
Basophils Absolute: 0.1 10*3/uL (ref 0–0.1)
Eosinophils Absolute: 0 10*3/uL (ref 0–0.7)
Eosinophils Relative: 0 %
HEMATOCRIT: 28.5 % — AB (ref 35.0–47.0)
HEMOGLOBIN: 9.5 g/dL — AB (ref 12.0–16.0)
Lymphocytes Relative: 6 %
Lymphs Abs: 1.1 10*3/uL (ref 1.0–3.6)
MCH: 29.8 pg (ref 26.0–34.0)
MCHC: 33.4 g/dL (ref 32.0–36.0)
MCV: 89.1 fL (ref 80.0–100.0)
MONOS PCT: 8 %
Monocytes Absolute: 1.4 10*3/uL — ABNORMAL HIGH (ref 0.2–0.9)
NEUTROS ABS: 14.3 10*3/uL — AB (ref 1.4–6.5)
NEUTROS PCT: 85 %
Platelets: 170 10*3/uL (ref 150–440)
RBC: 3.2 MIL/uL — ABNORMAL LOW (ref 3.80–5.20)
RDW: 23.5 % — AB (ref 11.5–14.5)
WBC: 16.9 10*3/uL — ABNORMAL HIGH (ref 3.6–11.0)

## 2017-01-01 LAB — COMPREHENSIVE METABOLIC PANEL
ALK PHOS: 410 U/L — AB (ref 38–126)
ALT: 57 U/L — ABNORMAL HIGH (ref 14–54)
ANION GAP: 8 (ref 5–15)
AST: 79 U/L — ABNORMAL HIGH (ref 15–41)
Albumin: 2.3 g/dL — ABNORMAL LOW (ref 3.5–5.0)
BUN: 8 mg/dL (ref 6–20)
CALCIUM: 7.8 mg/dL — AB (ref 8.9–10.3)
CHLORIDE: 99 mmol/L — AB (ref 101–111)
CO2: 28 mmol/L (ref 22–32)
Creatinine, Ser: 0.55 mg/dL (ref 0.44–1.00)
GFR calc non Af Amer: >60 mL/min (ref >60)
Glucose, Bld: 94 mg/dL (ref 65–99)
Potassium: 3.4 mmol/L — ABNORMAL LOW (ref 3.5–5.1)
SODIUM: 135 mmol/L (ref 135–145)
Total Bilirubin: 1 mg/dL (ref 0.3–1.2)
Total Protein: 5.6 g/dL — ABNORMAL LOW (ref 6.5–8.1)

## 2017-01-01 MED ORDER — SODIUM CHLORIDE 0.9% FLUSH
10.0000 mL | INTRAVENOUS | Status: DC | PRN
  Administered 2017-01-01: 10 mL via INTRAVENOUS
  Filled 2017-01-01: qty 10

## 2017-01-01 MED ORDER — HEPARIN SOD (PORK) LOCK FLUSH 100 UNIT/ML IV SOLN
INTRAVENOUS | Status: AC
  Filled 2017-01-01: qty 5

## 2017-01-01 MED ORDER — HEPARIN SOD (PORK) LOCK FLUSH 100 UNIT/ML IV SOLN
500.0000 [IU] | Freq: Once | INTRAVENOUS | Status: AC
  Administered 2017-01-01: 500 [IU] via INTRAVENOUS

## 2017-01-01 NOTE — Assessment & Plan Note (Addendum)
#  RECURRENT Metastatic breast cancer- ER/PR positive HER-2/neu negative [status post liver biopsy February 2018; previously HER-2/neu positive]. # s/p  Eribulin day 1  appx 2 week ago. Clinical response noted; however severe neutropenia [C discussion below]  # proceed with cycle #2- day-1 next week. Reduce dose of chemotherapy to 1 mg/m on day 1; continue 1.4 mg/m on day 8. Add onpro.  # Severe neutropenia- s/p granix day x5 days- improved; would recommend dose reductions moving forward. Today white count 16 likely from Granix.  # Growth factor-Neulasta/On pro would be given as prophylaxis for chemotherapy-induced neutropenia to prevent febrile neutropenias.  # Oral thrush- add diflucan 100 mg/day x7 day; cont nystatin; resolved.   # Bilateral lower extremities swelling- dependent edema/steroids/ improved. Continue  leg elevation  # PN G-1-2 Continue neurontin/cymblata.   # Elevated LFTs- secondary to involvement of the liver/ AST ALT improving.  # 1 week follow up/ labs/ chemo eribulin.

## 2017-01-01 NOTE — Progress Notes (Signed)
[name] OFF[facility] PROGRESS NOTE  Patient Care Team: [name], MD as PCP - General (Family Medicine) [name], MD (General Surgery) [name], MD (Inactive) as Attending Physician (Internal Medicine)  No matching staging information was found for the patient.   Oncology History   # LEFT BREAST IDC; STAGE II [cT2N1] ER >90%; PR- 50-90%; her 2 NEU- POS; s/p Neoadj chemo; AUG 2016- TCH+P s/p Lumpec & partial ALND- path CR; s/p RT [finished Nov 2016];  adj Herceptin; HELD for Jan 19th 2017 [in DEC 2016-EF dropped from 63 to 51%; FEB 27th EF-42.9%]; JAN 2016 START Arimidex; May 2017- EF- 60%; May 24th 2017-Re-start Herceptin q 3W; July 28th STOP herceptin [finished 51m/m2; sec to Low EF; however 2017 Oct EF= 67%; improved]  # DEC 4th 2017- Start Neratinib 4 pills; DEC 11th 3 pills; STOPPED.  # FEB 2018- RECURRENCE ER/PR positive; Her 2 NEGATIVE [liver Bx]  # MARCH 1st 2018- Tax-Cytoxan  # FEB 2018- Brain mets [SBRT; Dr.Crystal; finished Feb 28th 2018]  # March 2018- Eribulin  # PN- G-2; may 2017- Cymbalta 655md;MARCH 2018-  acute vascular insuff of BIL LE [? Taxotere vasoconstriction on asprin]; # hemorragic shock LGIB [s/p colo; Dr.Wohl]    # April 2016- Liver Bx- NEG;   # Drop in EF from Herceptin [recovered OCt 27th- EF 65%]  # BMD- jan 2017- osteopenia     Carcinoma of upper-outer quadrant of left breast in female, estrogen receptor positive ([facility]  12/27/2014 Initial Diagnosis    Breast cancer of upper-outer quadrant of left female breast ([facility]       INTERVAL HISTORY:  A very pleasant [age]ear old female patient with above history of Recurrent stage IV ER/PR positive HER-2/neu negative [status post liver biopsy February 2018] Status post cycle #1 day-1 of eribulin- approximately 2 week ago.  Patient cycle #1 day 8 was held last week because of severe neutropenia. Patient received Granix for 5 days.  Patient is currently a home. She  is alone. She has been able to go around in the hospital walker. Difficulty swallowing improved post Diflucan. Otherwise she denies any significant worsening of the tingling and numbness. She continues to have mild leg swelling.   Denies any nausea vomiting. Appetite is fair. Denies any abdominal pain. Otherwise denies any significant diarrhea. No falls.    REVIEW OF SYSTEMS:  A complete 10 point review of system is done which is negative except mentioned above/history of present illness.   PAST MEDICAL HISTORY :  Past Medical History:  Diagnosis Date  . Breast cancer ([facility] [place]   Left-radiation  . Cancer ([facility]   Stage II, ER positive, PR positive, HER-2/neu overexpressing of the left breast.  . Chemotherapy-induced peripheral neuropathy ([facility]  . Heart attack 1998  . Hyperlipidemia   . Hypertension   . Neuromuscular disorder (HCC)    neuropathy  . Neuropathy ([facility]    PAST SURGICAL HISTORY :   Past Surgical History:  Procedure Laterality Date  . BREAST BIOPSY Left 2016   Positive  . BREAST LUMPECTOMY WITH SENTINEL LYMPH NODE BIOPSY Left 05/23/2015   Procedure: LEFT BREAST WIDE EXCISION WITH AXILLARY DISSECTION, MASTOPLASTY ;  Surgeon: JeRobert BellowMD;  Location: [facility] ORS;  Service: General;  Laterality: Left;  . BREAST SURGERY Left 12/18/14   breast biopsy/INVASIVE DUCTAL CARCINOMA OF BREAST, NOTTINGHAM GRADE 2.  . Marland KitchenREAST SURGERY  05/23/2015.   Wide excision/mastoplasty, axillary dissection. No residual invasive cancer,  positive for residual DCIS. 0/2 nodes identified on axillary dissection. (no SLN by technetium or methylene blue)  . CARDIAC CATHETERIZATION    . COLONOSCOPY WITH PROPOFOL N/A 12/10/2016   Procedure: COLONOSCOPY WITH PROPOFOL;  Surgeon: Lucilla Lame, MD;  Location: [facility] ENDOSCOPY;  Service: Endoscopy;  Laterality: N/A;  . ESOPHAGOGASTRODUODENOSCOPY (EGD) WITH PROPOFOL N/A 12/08/2016   Procedure: ESOPHAGOGASTRODUODENOSCOPY (EGD) WITH PROPOFOL;  Surgeon:  Lucilla Lame, MD;  Location: [facility] ENDOSCOPY;  Service: Endoscopy;  Laterality: N/A;  . PORTACATH PLACEMENT Right 12-31-14   Dr Bary Castilla    FAMILY HISTORY :   Family History  Problem Relation Age of Onset  . Breast cancer Maternal Aunt   . Breast cancer Cousin   . Brain cancer Maternal Uncle     SOCIAL HISTORY:   Social History  Substance Use Topics  . Smoking status: Light Tobacco Smoker    Packs/day: 0.50    Years: 18.00    Types: Cigarettes  . Smokeless tobacco: Never Used  . Alcohol use No    ALLERGIES:  is allergic to no known allergies.  MEDICATIONS:  Current Outpatient Prescriptions  Medication Sig Dispense Refill  . anastrozole (ARIMIDEX) 1 MG tablet TAKE 1 TABLET DAILY 90 tablet 3  . aspirin 81 MG tablet Take 81 mg by mouth daily.    . carvedilol (COREG) 6.25 MG tablet Take 1 tablet (6.25 mg total) by mouth 2 (two) times daily. 180 tablet 0  . clopidogrel (PLAVIX) 75 MG tablet Take 1 tablet (75 mg total) by mouth daily. 30 tablet 1  . DULoxetine (CYMBALTA) 60 MG capsule TAKE 1 CAPSULE (60 MG TOTAL) BY MOUTH DAILY. 30 capsule 0  . feeding supplement, ENSURE ENLIVE, (ENSURE ENLIVE) LIQD Take 237 mLs by mouth 2 (two) times daily between meals. 237 mL 12  . fluconazole (DIFLUCAN) 100 MG tablet Take 1 tablet (100 mg total) by mouth daily. 7 tablet 0  . KLOR-CON M20 20 MEQ tablet Take 20 mEq by mouth 2 (two) times daily.  3  . nystatin (MYCOSTATIN) 100000 UNIT/ML suspension     . pantoprazole (PROTONIX) 40 MG tablet Take 1 tablet (40 mg total) by mouth 2 (two) times daily. 60 tablet 2  . polyethylene glycol (MIRALAX / GLYCOLAX) packet Take 17 g by mouth daily. 14 each 0  . senna-docusate (SENOKOT-S) 8.6-50 MG tablet Take 1 tablet by mouth 2 (two) times daily. 60 tablet 0  . simvastatin (ZOCOR) 40 MG tablet Take 40 mg by mouth daily.    . diphenoxylate-atropine (LOMOTIL) 2.5-0.025 MG tablet Take 1 tablet by mouth 4 (four) times daily as needed for diarrhea or loose stools. Take  it along with immodium (Patient not taking: Reported on 12/17/2016) 60 tablet 0  . ondansetron (ZOFRAN) 8 MG tablet Take 1 tablet (8 mg total) by mouth every 8 (eight) hours as needed for nausea or vomiting (start 3 days; after chemo). (Patient not taking: Reported on 12/17/2016) 40 tablet 1  . prochlorperazine (COMPAZINE) 10 MG tablet Take 1 tablet (10 mg total) by mouth every 6 (six) hours as needed for nausea or vomiting. (Patient not taking: Reported on 12/17/2016) 40 tablet 1   No current facility-administered medications for this visit.    Facility-Administered Medications Ordered in Other Visits  Medication Dose Route Frequency Provider Last Rate Last Dose  . 0.9 %  sodium chloride infusion   Intravenous Continuous [name], MD      . 0.9 %  sodium chloride infusion   Intravenous Continuous Lloyd Huger,  MD 999 mL/hr at 04/24/15 1520    . heparin lock flush 100 unit/mL  500 Units Intravenous Once Dmitriy Berenzon, MD      . heparin lock flush 100 unit/mL  500 Units Intravenous Once Cammie Sickle, MD      . sodium chloride 0.9 % injection 10 mL  10 mL Intravenous PRN [name], MD   10 mL at 04/04/15 1440  . sodium chloride flush (NS) 0.9 % injection 10 mL  10 mL Intravenous PRN Dmitriy Berenzon, MD      . sodium chloride flush (NS) 0.9 % injection 10 mL  10 mL Intravenous PRN Cammie Sickle, MD      . Tbo-Filgrastim [facility]) injection 480 mcg  480 mcg Subcutaneous Once Cammie Sickle, MD        PHYSICAL EXAMINATION: ECOG PERFORMANCE STATUS: 0 - Asymptomatic  BP 118/80 (BP Location: Right Arm, Patient Position: Sitting)   Pulse 99   Temp (!) 96.3 F (35.7 C) (Tympanic)   Resp 18   Wt 148 lb 8 oz (67.4 kg)   BMI 23.97 kg/m   Filed Weights   01/01/17 0847  Weight: 148 lb 8 oz (67.4 kg)    GENERAL: Well-nourished well-developed; Alert, no distress and comfortable.  EYES: no pallor or icterus. She is in a wheelchair.She is  Accompanied by her  son.. OROPHARYNX: no thrush or ulceration; good dentition  NECK: supple, no masses felt LYMPH:  no palpable lymphadenopathy in the cervical, axillary or inguinal regions LUNGS: clear to auscultation and  No wheeze or crackles HEART/CVS: regular rate & rhythm and no murmurs; 1+ bilateral lower extremity edema.  ABDOMEN:abdomen soft, non-tender and normal bowel sounds Musculoskeletal:no cyanosis of digits and no clubbing  PSYCH: alert & oriented x 3 with fluent speech NEURO: no focal motor/sensory deficits   LABORATORY DATA:  I have reviewed the data as listed    Component Value Date/Time   NA 135 01/01/2017 0830   NA 135 01/08/2015 0850   K 3.4 (L) 01/01/2017 0830   K 3.7 01/08/2015 0850   CL 99 (L) 01/01/2017 0830   CL 101 01/08/2015 0850   CO2 28 01/01/2017 0830   CO2 26 01/08/2015 0850   GLUCOSE 94 01/01/2017 0830   GLUCOSE 161 (H) 01/08/2015 0850   BUN 8 01/01/2017 0830   BUN 17 01/08/2015 0850   CREATININE 0.55 01/01/2017 0830   CREATININE 0.82 01/22/2015 1559   CALCIUM 7.8 (L) 01/01/2017 0830   CALCIUM 9.3 01/08/2015 0850   PROT 5.6 (L) 01/01/2017 0830   PROT 6.8 01/22/2015 1559   ALBUMIN 2.3 (L) 01/01/2017 0830   ALBUMIN 3.9 01/22/2015 1559   AST 79 (H) 01/01/2017 0830   AST 34 01/22/2015 1559   ALT 57 (H) 01/01/2017 0830   ALT 42 01/22/2015 1559   ALKPHOS 410 (H) 01/01/2017 0830   ALKPHOS 157 (H) 01/22/2015 1559   BILITOT 1.0 01/01/2017 0830   BILITOT 0.2 (L) 01/22/2015 1559   GFRNONAA >60 01/01/2017 0830   GFRNONAA >60 01/22/2015 1559   GFRAA >60 01/01/2017 0830   GFRAA >60 01/22/2015 1559    No results found for: SPEP, UPEP  Lab Results  Component Value Date   WBC 16.9 (H) 01/01/2017   NEUTROABS 14.3 (H) 01/01/2017   HGB 9.5 (L) 01/01/2017   HCT 28.5 (L) 01/01/2017   MCV 89.1 01/01/2017   PLT 170 01/01/2017      Chemistry      Component Value Date/Time  NA 135 01/01/2017 0830   NA 135 01/08/2015 0850   K 3.4 (L) 01/01/2017 0830   K 3.7  01/08/2015 0850   CL 99 (L) 01/01/2017 0830   CL 101 01/08/2015 0850   CO2 28 01/01/2017 0830   CO2 26 01/08/2015 0850   BUN 8 01/01/2017 0830   BUN 17 01/08/2015 0850   CREATININE 0.55 01/01/2017 0830   CREATININE 0.82 01/22/2015 1559      Component Value Date/Time   CALCIUM 7.8 (L) 01/01/2017 0830   CALCIUM 9.3 01/08/2015 0850   ALKPHOS 410 (H) 01/01/2017 0830   ALKPHOS 157 (H) 01/22/2015 1559   AST 79 (H) 01/01/2017 0830   AST 34 01/22/2015 1559   ALT 57 (H) 01/01/2017 0830   ALT 42 01/22/2015 1559   BILITOT 1.0 01/01/2017 0830   BILITOT 0.2 (L) 01/22/2015 1559            LABORATORY DATA:  I have reviewed the data as listed    Component Value Date/Time   NA 135 01/01/2017 0830   NA 135 01/08/2015 0850   K 3.4 (L) 01/01/2017 0830   K 3.7 01/08/2015 0850   CL 99 (L) 01/01/2017 0830   CL 101 01/08/2015 0850   CO2 28 01/01/2017 0830   CO2 26 01/08/2015 0850   GLUCOSE 94 01/01/2017 0830   GLUCOSE 161 (H) 01/08/2015 0850   BUN 8 01/01/2017 0830   BUN 17 01/08/2015 0850   CREATININE 0.55 01/01/2017 0830   CREATININE 0.82 01/22/2015 1559   CALCIUM 7.8 (L) 01/01/2017 0830   CALCIUM 9.3 01/08/2015 0850   PROT 5.6 (L) 01/01/2017 0830   PROT 6.8 01/22/2015 1559   ALBUMIN 2.3 (L) 01/01/2017 0830   ALBUMIN 3.9 01/22/2015 1559   AST 79 (H) 01/01/2017 0830   AST 34 01/22/2015 1559   ALT 57 (H) 01/01/2017 0830   ALT 42 01/22/2015 1559   ALKPHOS 410 (H) 01/01/2017 0830   ALKPHOS 157 (H) 01/22/2015 1559   BILITOT 1.0 01/01/2017 0830   BILITOT 0.2 (L) 01/22/2015 1559   GFRNONAA >60 01/01/2017 0830   GFRNONAA >60 01/22/2015 1559   GFRAA >60 01/01/2017 0830   GFRAA >60 01/22/2015 1559    No results found for: SPEP, UPEP  Lab Results  Component Value Date   WBC 16.9 (H) 01/01/2017   NEUTROABS 14.3 (H) 01/01/2017   HGB 9.5 (L) 01/01/2017   HCT 28.5 (L) 01/01/2017   MCV 89.1 01/01/2017   PLT 170 01/01/2017      Chemistry      Component Value Date/Time   NA  135 01/01/2017 0830   NA 135 01/08/2015 0850   K 3.4 (L) 01/01/2017 0830   K 3.7 01/08/2015 0850   CL 99 (L) 01/01/2017 0830   CL 101 01/08/2015 0850   CO2 28 01/01/2017 0830   CO2 26 01/08/2015 0850   BUN 8 01/01/2017 0830   BUN 17 01/08/2015 0850   CREATININE 0.55 01/01/2017 0830   CREATININE 0.82 01/22/2015 1559      Component Value Date/Time   CALCIUM 7.8 (L) 01/01/2017 0830   CALCIUM 9.3 01/08/2015 0850   ALKPHOS 410 (H) 01/01/2017 0830   ALKPHOS 157 (H) 01/22/2015 1559   AST 79 (H) 01/01/2017 0830   AST 34 01/22/2015 1559   ALT 57 (H) 01/01/2017 0830   ALT 42 01/22/2015 1559   BILITOT 1.0 01/01/2017 0830   BILITOT 0.2 (L) 01/22/2015 1559     IMPRESSION: Normal left ventricular wall motion  with estimated ejection fraction of 65%.   Electronically Signed   By: Marijo Sanes M.D.   On: 07/27/2016 09:04  RADIOGRAPHIC STUDIES: I have personally reviewed the radiological images as listed and agreed with the findings in the report. No results found.   ASSESSMENT & PLAN:  Carcinoma of upper-outer quadrant of left breast in female, estrogen receptor positive ([place]) # RECURRENT Metastatic breast cancer- ER/PR positive HER-2/neu negative [status post liver biopsy February 2018; previously HER-2/neu positive]. # s/p  Eribulin day 1  appx 2 week ago. Clinical response noted; however severe neutropenia [C discussion below]  # proceed with cycle #2- day-1 next week. Reduce dose of chemotherapy to 1 mg/m on day 1; continue 1.4 mg/m on day 8. Add onpro.  # Severe neutropenia- s/p granix day x5 days- improved; would recommend dose reductions moving forward. Today white count 16 likely from Granix.  # Growth factor-Neulasta/On pro would be given as prophylaxis for chemotherapy-induced neutropenia to prevent febrile neutropenias.  # Oral thrush- add diflucan 100 mg/day x7 day; cont nystatin; resolved.   # Bilateral lower extremities swelling- dependent edema/steroids/  improved. Continue  leg elevation  # PN G-1-2 Continue neurontin/cymblata.   # Elevated LFTs- secondary to involvement of the liver/ AST ALT improving.  # 1 week follow up/ labs/ chemo eribulin.   Orders Placed This Encounter  Procedures  . Cancer antigen 27.29    Standing Status:   Future    Standing Expiration Date:   01/01/2018  . CBC with Differential/Platelet    Standing Status:   Future    Standing Expiration Date:   01/01/2018  . Comprehensive metabolic panel    Standing Status:   Future    Standing Expiration Date:   01/01/2018  . CEA    Standing Status:   Future    Standing Expiration Date:   01/01/2018   All questions were answered. The patient knows to call the clinic with any problems, questions or concerns.      Cammie Sickle, MD 01/01/2017 9:36 AM

## 2017-01-01 NOTE — Progress Notes (Signed)
Patient offers no complaints today. 

## 2017-01-04 DIAGNOSIS — K270 Acute peptic ulcer, site unspecified, with hemorrhage: Secondary | ICD-10-CM | POA: Diagnosis not present

## 2017-01-04 DIAGNOSIS — C7931 Secondary malignant neoplasm of brain: Secondary | ICD-10-CM | POA: Diagnosis not present

## 2017-01-04 DIAGNOSIS — Z7982 Long term (current) use of aspirin: Secondary | ICD-10-CM | POA: Diagnosis not present

## 2017-01-04 DIAGNOSIS — G620 Drug-induced polyneuropathy: Secondary | ICD-10-CM | POA: Diagnosis not present

## 2017-01-04 DIAGNOSIS — C787 Secondary malignant neoplasm of liver and intrahepatic bile duct: Secondary | ICD-10-CM | POA: Diagnosis not present

## 2017-01-04 DIAGNOSIS — F17210 Nicotine dependence, cigarettes, uncomplicated: Secondary | ICD-10-CM | POA: Diagnosis not present

## 2017-01-04 DIAGNOSIS — C7951 Secondary malignant neoplasm of bone: Secondary | ICD-10-CM | POA: Diagnosis not present

## 2017-01-04 DIAGNOSIS — I10 Essential (primary) hypertension: Secondary | ICD-10-CM | POA: Diagnosis not present

## 2017-01-04 DIAGNOSIS — C50912 Malignant neoplasm of unspecified site of left female breast: Secondary | ICD-10-CM | POA: Diagnosis not present

## 2017-01-05 ENCOUNTER — Encounter: Payer: Self-pay | Admitting: *Deleted

## 2017-01-05 ENCOUNTER — Other Ambulatory Visit: Payer: Self-pay | Admitting: *Deleted

## 2017-01-05 ENCOUNTER — Telehealth: Payer: Self-pay | Admitting: Cardiovascular Disease

## 2017-01-05 ENCOUNTER — Ambulatory Visit: Payer: BLUE CROSS/BLUE SHIELD | Admitting: Gastroenterology

## 2017-01-05 DIAGNOSIS — C787 Secondary malignant neoplasm of liver and intrahepatic bile duct: Secondary | ICD-10-CM | POA: Diagnosis not present

## 2017-01-05 DIAGNOSIS — D649 Anemia, unspecified: Secondary | ICD-10-CM

## 2017-01-05 DIAGNOSIS — C50912 Malignant neoplasm of unspecified site of left female breast: Secondary | ICD-10-CM | POA: Diagnosis not present

## 2017-01-05 DIAGNOSIS — F17210 Nicotine dependence, cigarettes, uncomplicated: Secondary | ICD-10-CM | POA: Diagnosis not present

## 2017-01-05 DIAGNOSIS — Z7982 Long term (current) use of aspirin: Secondary | ICD-10-CM | POA: Diagnosis not present

## 2017-01-05 DIAGNOSIS — C7931 Secondary malignant neoplasm of brain: Secondary | ICD-10-CM | POA: Diagnosis not present

## 2017-01-05 DIAGNOSIS — G620 Drug-induced polyneuropathy: Secondary | ICD-10-CM | POA: Diagnosis not present

## 2017-01-05 DIAGNOSIS — K270 Acute peptic ulcer, site unspecified, with hemorrhage: Secondary | ICD-10-CM | POA: Diagnosis not present

## 2017-01-05 DIAGNOSIS — C7951 Secondary malignant neoplasm of bone: Secondary | ICD-10-CM | POA: Diagnosis not present

## 2017-01-05 DIAGNOSIS — I10 Essential (primary) hypertension: Secondary | ICD-10-CM | POA: Diagnosis not present

## 2017-01-05 NOTE — Telephone Encounter (Signed)
[facility]urse with [facility] states pt has had elevated HR. States she is having a lot of fluctuating. States it is running in the 130

## 2017-01-05 NOTE — Telephone Encounter (Signed)
[facility] nurse, Mardene Celeste, reports seeing patient today for the first time. Pt expressed concerns for fluctuating heart rate  in the 130s as reported by the patient.. VS checked by Mardene Celeste today: BP 102/64, HR 87. She had not taken coreg and is unsure if she should take it due to lower BP.   Called pt who reports continued elevated HR. States she take VS each morning but can not remember any readings. BP 110/80 this morning but doesn't remember HR. Pt took scheduled coreg. I asked her to recheck VS while on the phone. She reports BP 91/68, HR 91 and states she feels "fine". Pt last seen July 2017; past due for f/u Pt agreeable to appt 4/12, 2pm w/Ryan Dunn, PA-C. She has infusion same morning at the [facility] and is agreeable to appt afterwards. She will notify her ride of the appt.  Pt will continue to monitor BP and HR and report readings at OV. Pt agreeable w/plan.

## 2017-01-06 DIAGNOSIS — I10 Essential (primary) hypertension: Secondary | ICD-10-CM | POA: Diagnosis not present

## 2017-01-06 DIAGNOSIS — K270 Acute peptic ulcer, site unspecified, with hemorrhage: Secondary | ICD-10-CM | POA: Diagnosis not present

## 2017-01-06 DIAGNOSIS — F17210 Nicotine dependence, cigarettes, uncomplicated: Secondary | ICD-10-CM | POA: Diagnosis not present

## 2017-01-06 DIAGNOSIS — C50912 Malignant neoplasm of unspecified site of left female breast: Secondary | ICD-10-CM | POA: Diagnosis not present

## 2017-01-06 DIAGNOSIS — C7951 Secondary malignant neoplasm of bone: Secondary | ICD-10-CM | POA: Diagnosis not present

## 2017-01-06 DIAGNOSIS — G620 Drug-induced polyneuropathy: Secondary | ICD-10-CM | POA: Diagnosis not present

## 2017-01-06 DIAGNOSIS — C7931 Secondary malignant neoplasm of brain: Secondary | ICD-10-CM | POA: Diagnosis not present

## 2017-01-06 DIAGNOSIS — C787 Secondary malignant neoplasm of liver and intrahepatic bile duct: Secondary | ICD-10-CM | POA: Diagnosis not present

## 2017-01-06 DIAGNOSIS — Z7982 Long term (current) use of aspirin: Secondary | ICD-10-CM | POA: Diagnosis not present

## 2017-01-07 ENCOUNTER — Ambulatory Visit (INDEPENDENT_AMBULATORY_CARE_PROVIDER_SITE_OTHER): Payer: BLUE CROSS/BLUE SHIELD | Admitting: Physician Assistant

## 2017-01-07 ENCOUNTER — Encounter: Payer: Self-pay | Admitting: Physician Assistant

## 2017-01-07 ENCOUNTER — Inpatient Hospital Stay: Payer: BLUE CROSS/BLUE SHIELD

## 2017-01-07 ENCOUNTER — Inpatient Hospital Stay (HOSPITAL_BASED_OUTPATIENT_CLINIC_OR_DEPARTMENT_OTHER): Payer: BLUE CROSS/BLUE SHIELD | Admitting: Internal Medicine

## 2017-01-07 VITALS — BP 112/81 | HR 110 | Temp 97.1°F | Resp 18 | Ht 66.0 in | Wt 148.0 lb

## 2017-01-07 VITALS — BP 98/72 | HR 89 | Ht 66.0 in | Wt 143.8 lb

## 2017-01-07 DIAGNOSIS — D709 Neutropenia, unspecified: Secondary | ICD-10-CM | POA: Diagnosis not present

## 2017-01-07 DIAGNOSIS — I252 Old myocardial infarction: Secondary | ICD-10-CM | POA: Diagnosis not present

## 2017-01-07 DIAGNOSIS — Z803 Family history of malignant neoplasm of breast: Secondary | ICD-10-CM

## 2017-01-07 DIAGNOSIS — D701 Agranulocytosis secondary to cancer chemotherapy: Secondary | ICD-10-CM

## 2017-01-07 DIAGNOSIS — E782 Mixed hyperlipidemia: Secondary | ICD-10-CM

## 2017-01-07 DIAGNOSIS — I471 Supraventricular tachycardia: Secondary | ICD-10-CM | POA: Diagnosis not present

## 2017-01-07 DIAGNOSIS — M8580 Other specified disorders of bone density and structure, unspecified site: Secondary | ICD-10-CM | POA: Diagnosis not present

## 2017-01-07 DIAGNOSIS — I10 Essential (primary) hypertension: Secondary | ICD-10-CM

## 2017-01-07 DIAGNOSIS — F17210 Nicotine dependence, cigarettes, uncomplicated: Secondary | ICD-10-CM | POA: Diagnosis not present

## 2017-01-07 DIAGNOSIS — C7931 Secondary malignant neoplasm of brain: Secondary | ICD-10-CM | POA: Diagnosis not present

## 2017-01-07 DIAGNOSIS — T451X5A Adverse effect of antineoplastic and immunosuppressive drugs, initial encounter: Secondary | ICD-10-CM

## 2017-01-07 DIAGNOSIS — Z170 Estrogen receptor positive status [ER+]: Secondary | ICD-10-CM

## 2017-01-07 DIAGNOSIS — R7989 Other specified abnormal findings of blood chemistry: Secondary | ICD-10-CM

## 2017-01-07 DIAGNOSIS — C50919 Malignant neoplasm of unspecified site of unspecified female breast: Secondary | ICD-10-CM

## 2017-01-07 DIAGNOSIS — I427 Cardiomyopathy due to drug and external agent: Secondary | ICD-10-CM | POA: Diagnosis not present

## 2017-01-07 DIAGNOSIS — C50412 Malignant neoplasm of upper-outer quadrant of left female breast: Secondary | ICD-10-CM

## 2017-01-07 DIAGNOSIS — M7989 Other specified soft tissue disorders: Secondary | ICD-10-CM

## 2017-01-07 DIAGNOSIS — B370 Candidal stomatitis: Secondary | ICD-10-CM | POA: Diagnosis not present

## 2017-01-07 DIAGNOSIS — G629 Polyneuropathy, unspecified: Secondary | ICD-10-CM | POA: Diagnosis not present

## 2017-01-07 DIAGNOSIS — Z7982 Long term (current) use of aspirin: Secondary | ICD-10-CM | POA: Diagnosis not present

## 2017-01-07 DIAGNOSIS — Z79899 Other long term (current) drug therapy: Secondary | ICD-10-CM

## 2017-01-07 DIAGNOSIS — D649 Anemia, unspecified: Secondary | ICD-10-CM

## 2017-01-07 DIAGNOSIS — Z7689 Persons encountering health services in other specified circumstances: Secondary | ICD-10-CM

## 2017-01-07 DIAGNOSIS — E785 Hyperlipidemia, unspecified: Secondary | ICD-10-CM | POA: Diagnosis not present

## 2017-01-07 DIAGNOSIS — Z5111 Encounter for antineoplastic chemotherapy: Secondary | ICD-10-CM | POA: Diagnosis not present

## 2017-01-07 DIAGNOSIS — Z808 Family history of malignant neoplasm of other organs or systems: Secondary | ICD-10-CM

## 2017-01-07 LAB — COMPREHENSIVE METABOLIC PANEL
ALBUMIN: 2.6 g/dL — AB (ref 3.5–5.0)
ALK PHOS: 317 U/L — AB (ref 38–126)
ALT: 32 U/L (ref 14–54)
AST: 54 U/L — AB (ref 15–41)
Anion gap: 7 (ref 5–15)
BILIRUBIN TOTAL: 1.3 mg/dL — AB (ref 0.3–1.2)
BUN: 11 mg/dL (ref 6–20)
CHLORIDE: 101 mmol/L (ref 101–111)
CO2: 28 mmol/L (ref 22–32)
Calcium: 8.4 mg/dL — ABNORMAL LOW (ref 8.9–10.3)
Creatinine, Ser: 0.53 mg/dL (ref 0.44–1.00)
GFR calc Af Amer: >60 mL/min (ref >60)
GFR calc non Af Amer: >60 mL/min (ref >60)
Glucose, Bld: 91 mg/dL (ref 65–99)
POTASSIUM: 3.6 mmol/L (ref 3.5–5.1)
Sodium: 136 mmol/L (ref 135–145)
Total Protein: 6.5 g/dL (ref 6.5–8.1)

## 2017-01-07 LAB — CBC WITH DIFFERENTIAL/PLATELET
BASOS ABS: 0 10*3/uL (ref 0–0.1)
BASOS PCT: 1 %
Eosinophils Absolute: 0 10*3/uL (ref 0–0.7)
Eosinophils Relative: 0 %
HEMATOCRIT: 31.5 % — AB (ref 35.0–47.0)
HEMOGLOBIN: 10.5 g/dL — AB (ref 12.0–16.0)
LYMPHS PCT: 14 %
Lymphs Abs: 0.5 10*3/uL — ABNORMAL LOW (ref 1.0–3.6)
MCH: 30.2 pg (ref 26.0–34.0)
MCHC: 33.5 g/dL (ref 32.0–36.0)
MCV: 90.2 fL (ref 80.0–100.0)
Monocytes Absolute: 0.3 10*3/uL (ref 0.2–0.9)
Monocytes Relative: 8 %
NEUTROS ABS: 2.9 10*3/uL (ref 1.4–6.5)
NEUTROS PCT: 77 %
Platelets: 251 10*3/uL (ref 150–440)
RBC: 3.49 MIL/uL — AB (ref 3.80–5.20)
RDW: 24.7 % — AB (ref 11.5–14.5)
WBC: 3.8 10*3/uL (ref 3.6–11.0)

## 2017-01-07 LAB — SAMPLE TO BLOOD BANK

## 2017-01-07 MED ORDER — HEPARIN SOD (PORK) LOCK FLUSH 100 UNIT/ML IV SOLN
500.0000 [IU] | Freq: Once | INTRAVENOUS | Status: AC
  Administered 2017-01-07: 500 [IU] via INTRAVENOUS

## 2017-01-07 MED ORDER — SODIUM CHLORIDE 0.9% FLUSH
10.0000 mL | Freq: Once | INTRAVENOUS | Status: AC
  Administered 2017-01-07: 10 mL via INTRAVENOUS
  Filled 2017-01-07: qty 10

## 2017-01-07 MED ORDER — SODIUM CHLORIDE 0.9 % IV SOLN
Freq: Once | INTRAVENOUS | Status: AC
  Administered 2017-01-07: 10:00:00 via INTRAVENOUS
  Filled 2017-01-07: qty 1000

## 2017-01-07 MED ORDER — METOPROLOL TARTRATE 25 MG PO TABS: 12.5000 mg | ORAL_TABLET | Freq: Two times a day (BID) | ORAL | 3 refills | Status: DC

## 2017-01-07 MED ORDER — HEPARIN SOD (PORK) LOCK FLUSH 100 UNIT/ML IV SOLN
INTRAVENOUS | Status: AC
  Filled 2017-01-07: qty 5

## 2017-01-07 MED ORDER — PROCHLORPERAZINE MALEATE 10 MG PO TABS
10.0000 mg | ORAL_TABLET | Freq: Once | ORAL | Status: AC
  Administered 2017-01-07: 10 mg via ORAL
  Filled 2017-01-07: qty 1

## 2017-01-07 MED ORDER — SODIUM CHLORIDE 0.9 % IV SOLN
1.4000 mg/m2 | Freq: Once | INTRAVENOUS | Status: AC
  Administered 2017-01-07: 2.5 mg via INTRAVENOUS
  Filled 2017-01-07: qty 5

## 2017-01-07 NOTE — Addendum Note (Signed)
Addended by: Vanessa Ralphs on: 01/07/2017 11:52 AM   Modules accepted: Orders

## 2017-01-07 NOTE — Progress Notes (Signed)
Change in weight >10%.  BSA now 1.66m.  Updated treatment plan, ok per MD.

## 2017-01-07 NOTE — Patient Instructions (Signed)
Medication Instructions:  Your physician has recommended you make the following change in your medication:  1- STOP taking Carvedilol. 2- START taking Metoprolol tartrate ([place]er) 12.5 mg  (1/2 tablet) by mouth two times a day. 3- TODAY ONLY take Potassium 40 mEq this morning and 40 mEq this evening, THEN go back to taking 20 mEq by mouth two times a day as usual.   Labwork: Your physician recommends that you return for lab work in: TODAY (TSH, Mg).   Testing/Procedures: none  Follow-Up: Your physician recommends that you schedule a follow-up appointment in: [place]. - You will receive a reminder letter in the mail two months in advance. If you don't receive a letter, please call our office to schedule the follow-up appointment.   If you need a refill on your cardiac medications before your next appointment, please call your pharmacy.

## 2017-01-07 NOTE — Progress Notes (Signed)
Cardiology Office Note Date:  01/07/2017  Patient ID:  Sarah, Horne 1956/03/26, MRN [id number] PCP:  Keith Rake, MD  Cardiologist:  Dr. Fletcher Anon, MD    Chief Complaint: Palpitations   History of Present Illness: Sarah Horne is a 61 y.o. female with history of evaluation of tachycardic heart rate/palpitations. Patient has history of Herceptin-induced cardiomyopathy with patient reported possible prior myocardial infarction in 1998 where she underwent cardiac catheterization at [facility] and did not require revascularization, she was treated medically (details of that admission are not available). She also has metastatic breast cancer diagnosed in 2016 with metastasis to the liver and now brain, hypertension, hyperlipidemia, and tobacco abuse.   She started treatment with Taxotere/Carboplatin/Herceptin/Perjeta in April 2016. She underwent a MUGA scan in July 2016 which showed an ejection fraction of 63%. This dropped to 51% in December. Given that she was asymptomatic, Herceptin was given in December. She underwent repeat MUGA scan on 11/25/2015 that showed a drop in ejection fraction to 44%. TTE in March 2017 showed an ejection fraction of 45-50% with no significant valvular abnormalities or pulmonary hypertension. At that time she was switched from atenolol to carvedilol. She underwent repeat MUGA scan in May 2017 which showed an ejection fraction of 60% area her Herceptin was resumed at that time. She was last seen by Dr. Fletcher Anon on 04/14/2016 and doing well at that time. She underwent repeat MUGA scan in 06/2016 that showed ejection fraction of 65%. She was continued on Herceptin. She was admitted to [facility] in mid February 2018 with left sided headache and expressive aphasia. CT scan of the head showed left frontoparietal mass with surrounding edema consistent with new brain metastasis from her primary breast cancer. She was treated with IV Decadron followed by Decadron taper per oncology. MRI brain  showed metastatic lesion along left temporal lobe with extensive surrounding edema and a 5 mm midline shift to the right. The mass measured approximately 19 x 18 mm. She last underwent MUGA scan on 11/16/2016 that showed ejection fraction of 61% that was minimally decreased from 65% on 07/24/2016. An oncology follow-up, it was recommended patient undergo palliative chemotherapy after radiation to the brain with recommendation of Taxotere/Cytoxan. She was subsequently admitted to [facility] in early March 2018 with hypovolemic shock secondary to GI bleed with EGD showing gastritis and nonbleeding peptic ulcer and colonoscopy showing ulcers of the rectum and sigmoid region. She required 3 units of packed red blood cell transfusion. She was seen by GI who okayed the initiation of aspirin and Plavix for the treatment of the patient's peripheral arterial disease given tobacco abuse. During this admission she was noted to have bilateral lower extremity cyanosis and pallor with cold feet. Pulses were initially only noted on Doppler ultrasound though status post transfusions as above patient was noted to have strong palpable peripheral lower extremity pulses. She was seen by vascular surgery who recommended no vascular surgical intervention at that time. During this admission patient's beta blocker was held, though restarted upon discharge. She was seen in the ED on 12/19/2016 with epistaxis requiring silver nitrate cauterization. She was seen by oncology on 12/25/2016 for routine follow-up and noted to be dehydrated at that time secondary to poor by mouth intake by mouth secondary to pain with swallowing/thrush. She was treated with IV hydration on 3/30 with planned Granix infusion on 3/31. While in the cancer center on 3/31 patient was noted to be hypotensive with blood pressure 88/51 as well as tachycardic with heart  rate of 171 BPM. She was transferred to the ER where 12-lead EKG showed SVT, 165 BPM, no acute ST-T changes.  Patient reportedly converted to sinus tachycardia at 103 bpm prior to MD evaluating and treating patient. No labs were drawn as patient had recently had cbc/cmet drawn at the [facility] one day prior that showed a potassium of 3.5, albumin 2.4, AST 118, ALT 88, alkaline phosphatase 343, serum creatinine 0.45, white blood cell count 0.6, hemoglobin 9.4, platelet count 160. She was asymptomatic in the ER. ER physician discussed case with oncology who felt patient could be discharged from the ED. Since that ED visit on 3/31 patient has received IV infusions for dehydration on 4/1, 4/2, and 4/3. At each visit patient was noted to be tachycardic with heart rates ranging from the 110s to 120s. There are no EKGs for review from those dates.   Patient contacted the office on 12/30/2016 with concerns of her elevated heart rate. At that time, patient had been in rehabilitation for 2 weeks and was reportedly not given her carvedilol. She was instructed to take carvedilol twice a day as prescribed and appointment was scheduled. Patient's home health nurse contacted the office on 4/10 reporting the patient had concerns for elevated heart rate into the 130s bpm as reported per the patient. At that time home health nurse check blood pressure and found it to be 102/64 with a heart rate of 87 bpm. Patient reportedly took her scheduled carvedilol with blood pressure trending to 91/68 with heart rate of 91 bpm. Patient was asymptomatic. Appointment was scheduled for 4/12.   Most recent labs on 01/01/2017 show potassium 3.4, serum creatinine 0.55, albumin 2.3, AST 79, ALT 57, alkaline phosphatase 410, white blood cell count 16.9, hemoglobin 9.5, platelet count 170. No prior thyroid function study for review in Epic. No prior magnesium for review in Epic.   Patient was seen by oncology today noted to be tachycardic with heart rate 110 bpm with blood pressure 112/81. She underwent chemotherapy infusion without event.  Patient  comes in today doing well. Her only concern is why did her heart rate become tachycardic into the 160s bpm. She was completely asymptomatic while in documented SVT on 3/31 and has been asymptomatic with subsequently tachycardic heart rate that are not as rapid. Has missed some doses of Coreg. Never with chest pain, dizziness, palpitations, diaphoresis, presyncope, or syncope. No orthopnea. Decreased PO intake including fluids. LE swelling improved.    Past Medical History:  Diagnosis Date  . Breast cancer ([place]) 2016   Left-radiation  . Cancer ([place]) 2016   Stage II, ER positive, PR positive, HER-2/neu overexpressing of the left breast.  . Chemotherapy-induced peripheral neuropathy ([place])   . Heart attack 1998  . Hyperlipidemia   . Hypertension   . Neuromuscular disorder ([facility])    neuropathy  . Neuropathy [facility])     Past Surgical History:  Procedure Laterality Date  . BREAST BIOPSY Left 2016   Positive  . BREAST LUMPECTOMY WITH SENTINEL LYMPH NODE BIOPSY Left 05/23/2015   Procedure: LEFT BREAST WIDE EXCISION WITH AXILLARY DISSECTION, MASTOPLASTY ;  Surgeon: [name], MD;  Location: [facility] ORS;  Service: General;  Laterality: Left;  . BREAST SURGERY Left 12/18/14   breast biopsy/INVASIVE DUCTAL CARCINOMA OF BREAST, NOTTINGHAM GRADE 2.  Marland Kitchen BREAST SURGERY  05/23/2015.   Wide excision/mastoplasty, axillary dissection. No residual invasive cancer, positive for residual DCIS. 0/2 nodes identified on axillary dissection. (no SLN by technetium or methylene blue)  .  CARDIAC CATHETERIZATION    . COLONOSCOPY WITH PROPOFOL N/A 12/10/2016   Procedure: COLONOSCOPY WITH PROPOFOL;  Surgeon: Lucilla Lame, MD;  Location: [facility] ENDOSCOPY;  Service: Endoscopy;  Laterality: N/A;  . ESOPHAGOGASTRODUODENOSCOPY (EGD) WITH PROPOFOL N/A 12/08/2016   Procedure: ESOPHAGOGASTRODUODENOSCOPY (EGD) WITH PROPOFOL;  Surgeon: Lucilla Lame, MD;  Location: [facility] ENDOSCOPY;  Service: Endoscopy;  Laterality: N/A;  . PORTACATH  PLACEMENT Right 12-31-14   Dr Bary Castilla    Current Outpatient Prescriptions  Medication Sig Dispense Refill  . anastrozole (ARIMIDEX) 1 MG tablet TAKE 1 TABLET DAILY 90 tablet 3  . aspirin 81 MG tablet Take 81 mg by mouth daily.    . carvedilol (COREG) 6.25 MG tablet Take 1 tablet (6.25 mg total) by mouth 2 (two) times daily. 180 tablet 0  . clopidogrel (PLAVIX) 75 MG tablet Take 1 tablet (75 mg total) by mouth daily. 30 tablet 1  . diphenoxylate-atropine (LOMOTIL) 2.5-0.025 MG tablet Take 1 tablet by mouth 4 (four) times daily as needed for diarrhea or loose stools. Take it along with immodium 60 tablet 0  . DULoxetine (CYMBALTA) 60 MG capsule TAKE 1 CAPSULE (60 MG TOTAL) BY MOUTH DAILY. 30 capsule 0  . feeding supplement, ENSURE ENLIVE, (ENSURE ENLIVE) LIQD Take 237 mLs by mouth 2 (two) times daily between meals. 237 mL 12  . KLOR-CON M20 20 MEQ tablet Take 20 mEq by mouth 2 (two) times daily.  3  . ondansetron (ZOFRAN) 8 MG tablet Take 1 tablet (8 mg total) by mouth every 8 (eight) hours as needed for nausea or vomiting (start 3 days; after chemo). 40 tablet 1  . pantoprazole (PROTONIX) 40 MG tablet Take 1 tablet (40 mg total) by mouth 2 (two) times daily. 60 tablet 2  . polyethylene glycol (MIRALAX / GLYCOLAX) packet Take 17 g by mouth daily. 14 each 0   No current facility-administered medications for this visit.    Facility-Administered Medications Ordered in Other Visits  Medication Dose Route Frequency Provider Last Rate Last Dose  . 0.9 %  sodium chloride infusion   Intravenous Continuous [name], MD      . 0.9 %  sodium chloride infusion   Intravenous Continuous Lloyd Huger, MD 999 mL/hr at 04/24/15 1520    . heparin lock flush 100 unit/mL  500 Units Intravenous Once Dmitriy Berenzon, MD      . sodium chloride 0.9 % injection 10 mL  10 mL Intravenous PRN [name], MD   10 mL at 04/04/15 1440  . sodium chloride flush (NS) 0.9 % injection 10 mL  10 mL Intravenous PRN  Dmitriy Berenzon, MD      . Tbo-Filgrastim (GRANIX) injection 480 mcg  480 mcg Subcutaneous Once Cammie Sickle, MD        Allergies:   No known allergies   Social History:  The patient  reports that she has been smoking Cigarettes.  She has a 9.00 pack-year smoking history. She has never used smokeless tobacco. She reports that she does not drink alcohol or use drugs.   Family History:  The patient's family history includes Brain cancer in her maternal uncle; Breast cancer in her cousin and maternal aunt.  ROS:   Review of Systems  Constitutional: Positive for malaise/fatigue. Negative for chills, diaphoresis, fever and weight loss.  HENT: Negative for congestion.   Eyes: Negative for discharge and redness.  Respiratory: Negative for cough, hemoptysis, sputum production, shortness of breath and wheezing.   Cardiovascular: Positive for leg swelling. Negative  for chest pain, palpitations, orthopnea, claudication and PND.       LE swelling improved  Gastrointestinal: Negative for abdominal pain, blood in stool, heartburn, melena, nausea and vomiting.  Genitourinary: Negative for hematuria.  Musculoskeletal: Negative for falls and myalgias.  Skin: Negative for rash.  Neurological: Negative for dizziness, tingling, tremors, sensory change, speech change, focal weakness, loss of consciousness and weakness.  Endo/Heme/Allergies: Does not bruise/bleed easily.  Psychiatric/Behavioral: Negative for substance abuse. The patient is not nervous/anxious.   All other systems reviewed and are negative.    PHYSICAL EXAM:  VS:  BP 98/72 (BP Location: Right Arm, Patient Position: Sitting, Cuff Size: Normal)   Pulse 89   Ht 5' 6"  (1.676 m)   Wt 143 lb 12 oz (65.2 kg)   BMI 23.20 kg/m  BMI: Body mass index is 23.2 kg/m.  Physical Exam  Constitutional: She is oriented to person, place, and time. She appears well-developed and well-nourished.  HENT:  Head: Normocephalic and atraumatic.    Eyes: Right eye exhibits no discharge. Left eye exhibits no discharge.  Neck: Normal range of motion. No JVD present.  Cardiovascular: Normal rate, regular rhythm, S1 normal, S2 normal and normal heart sounds.  Exam reveals no distant heart sounds, no friction rub, no midsystolic click and no opening snap.   No murmur heard. Pulmonary/Chest: Effort normal and breath sounds normal. No respiratory distress. She has no decreased breath sounds. She has no wheezes. She has no rales. She exhibits no tenderness.  Abdominal: Soft. She exhibits no distension. There is no tenderness.  Musculoskeletal: She exhibits no edema.  Neurological: She is alert and oriented to person, place, and time.  Skin: Skin is warm and dry. No cyanosis. Nails show no clubbing.  Psychiatric: She has a normal mood and affect. Her speech is normal and behavior is normal. Judgment and thought content normal.     EKG:  Was ordered and interpreted by me today. Shows NSR, 89 bpm, inferior Q waves, low voltage QRS along precordial leads, poor R wave progression, TWI I, aVL, V2, V3. Prior EKGs from ED visit on 12/26/2016 personally reviewed as well.   Recent Labs: 01/07/2017: ALT 32; BUN 11; Creatinine, Ser 0.53; Hemoglobin 10.5; Platelets 251; Potassium 3.6; Sodium 136  01/29/2016: Cholesterol 130; HDL 42; LDL Cholesterol 74; Total CHOL/HDL Ratio 3.1; Triglycerides 72; VLDL 14   Estimated Creatinine Clearance: 67.4 mL/min (by C-G formula based on SCr of 0.53 mg/dL).   Wt Readings from Last 3 Encounters:  01/07/17 143 lb 12 oz (65.2 kg)  01/07/17 148 lb (67.1 kg)  01/01/17 148 lb 8 oz (67.4 kg)     Other studies reviewed: Additional studies/records reviewed today include: summarized above  ASSESSMENT AND PLAN:  1. Paroxysmal SVT: Currently in sinus rhythm with heart rate of 89 bpm. Asymptomatic when in SVT on 3/31. Has been asymptomatic when subsequently having tachycardic heart rates noted during infusion sessions. Uncertain  if her subsequent rates are in the setting of SVT, though this is less likely given heart rates in the low 100s bpm. Discussed with Dr. Fletcher Anon. Change Coreg to Lopressor 12.5 mg bid with titration as needed for added heart rate control and less antihypertensive effect given her soft blood pressure. Possibly in the setting of hypokalemia that is improving vs stress of her underlying cancer. Check magnesium with recommended repletion of 2.0 as indicated. Check TSH. Replete potassium to goal of 4.0 by taking an extra KCl 20 meq bid today (40 bid today) then  go back to 20 meq bid on 4/13. She has [place] come out to her house twice weekly to check vital signs. Recommend if HHRN notes hypotension or persistently elevated heart rates to contact us. No plans for EP evaluation/SVT ablation at this time given her comorbidities.   2. Chemotherapy-induced cardiomyopathy: Due to Herceptin. EF subsequently improved to 61% by most recent study per HPI. She does not appear to be volume overloaded at this time. Given issues with hypotension her Coreg has been changed to Lopressor for added heart rate control.   3. Essential HTN: BP soft in the office today at 98 mmHg systolic. At cancer center BP was 488 mmHg systolic. She has not taken her morning medications today. She reports not drinking enough fluids at home, though denies symptoms concerning for orthostatic hypotension. Change Coreg to Lopressor as above for added heart rate control and slightly less antihypertensive effect. Increase fluids.   4. HLD: No longer on statin, likely 2/2 elevated LFTs from metastatic cancer. Last LDL of 74 form 01/2016.    5. Tobacco abuse: Cessation advised.   6. Metastatic breast cancer: Status post chemotherapy as above. Followed by oncology. Oncology planning to repeat brain MRI in approximately 2-3 months. Oncology following elevated liver function, felt to be secondary to liver metastasis. On Neulasta/on pro for prophylaxis of  chemotherapy-induced neutropenia/febrile neutropenia. Weight appears stable.  7. Lower extremity swelling: Improved per patient. Likely depend edema 2/2 chemotherapy. Elevate legs.   Disposition: F/u with Dr. Fletcher Anon, MD in 3 months.   Current medicines are reviewed at length with the patient today.  The patient did not have any concerns regarding medicines.  Melvern Banker PA-C 01/07/2017 11:12 AM     [address] [place], [place] [address] [phone]

## 2017-01-07 NOTE — Assessment & Plan Note (Addendum)
#  RECURRENT Metastatic breast cancer- ER/PR positive HER-2/neu negative [status post liver biopsy February 2018; previously HER-2/neu positive]. # s/p  Eribulin day 1  appx 2 week ago. Clinical response noted; however severe neutropenia [C discussion below]  # proceed with cycle #2- day-1. Reduce dose of chemotherapy to 1 mg/m on day 1; continue 1.4 mg/m on day 8. Add onpro.  # Growth factor-Neulasta/On pro would be given as prophylaxis for chemotherapy-induced neutropenia to prevent febrile neutropenias.  # Brain mets- s/p RT [finished Feb 28th]; symptomatic improvement.   will plan to get MRI in 2-3 months;   # Bilateral lower extremities swelling- dependent edema/steroids/ improved. Continue  leg elevation  # PN G-1-2 Continue neurontin/cymblata.   # Elevated LFTs- secondary to involvement of the liver/ AST ALT improving.  # 1 week labs/ chemo eribulin; 3 weeks/labs/chemo.

## 2017-01-07 NOTE — Progress Notes (Signed)
[name]  Patient Care Team: Sarah Nova, MD as PCP - General (Family Medicine) [name], MD (General Surgery) [name], MD (Inactive) as Attending Physician (Internal Medicine)  No matching staging information was found for the patient.   Oncology History   # LEFT BREAST IDC; STAGE II [cT2N1] ER >90%; PR- 50-90%; her 2 NEU- POS; s/p Neoadj chemo; AUG 2016- TCH+P s/p Lumpec & partial ALND- path CR; s/p RT [finished Nov 2016];  adj Herceptin; HELD for Jan 19th 2017 [in DEC 2016-EF dropped from 63 to 51%; FEB 27th EF-42.9%]; JAN 2016 START Arimidex; May 2017- EF- 60%; May 24th 2017-Re-start Herceptin q 3W; July 28th STOP herceptin [finished 57m/m2; sec to Low EF; however 2017 Oct EF= 67%; improved]  # DEC 4th 2017- Start Neratinib 4 pills; DEC 11th 3 pills; STOPPED.  # FEB 2018- RECURRENCE ER/PR positive; Her 2 NEGATIVE [liver Bx]  # MARCH 1st 2018- Tax-Cytoxan  # FEB 2018- Brain mets [SBRT; Dr.Crystal; finished Feb 28th 2018]  # March 2018- Eribulin  # PN- G-2; may 2017- Cymbalta 615md;MARCH 2018-  acute vascular insuff of BIL LE [? Taxotere vasoconstriction on asprin]; # hemorragic shock LGIB [s/p colo; Dr.Wohl]    # April 2016- Liver Bx- NEG;   # Drop in EF from Herceptin [recovered OCt 27th- EF 65%]  # BMD- jan 2017- osteopenia     Carcinoma of upper-outer quadrant of left breast in female, estrogen receptor positive ([facility] [place]  12/27/2014 Initial Diagnosis    Breast cancer of upper-outer quadrant of left female breast ([facility]       INTERVAL HISTORY:  [name] with above history of Recurrent stage IV ER/PR positive HER-2/neu negative [status post liver biopsy February 2018] Status post cycle #1 day-1 of eribulin- approximately 3 week ago.  Patient is currently a home. She is alone. She has been able to go around in the hospital walker; Patient is getting physical therapy. Chronic  mild tingling and numbness. Not any worse.  Denies any nausea vomiting. Appetite is fair. Denies any abdominal pain. Otherwise denies any significant diarrhea. No falls.    REVIEW OF SYSTEMS:  A complete 10 point review of system is done which is negative except mentioned above/history of present illness.   PAST MEDICAL HISTORY :  Past Medical History:  Diagnosis Date  . Breast [place] ([facility]   Left-radiation  . Cancer ([facility]   Stage II, ER positive, PR positive, HER-2/neu overexpressing of the left breast.  . Chemotherapy-induced peripheral neuropathy ([facility] [place]  . Heart attack 1998  . Hyperlipidemia   . Hypertension   . Neuromuscular disorder (HCC)    neuropathy  . [place]uropathy ([facility] [place]    PAST SURGICAL HISTORY :   Past Surgical History:  Procedure Laterality Date  . BREAST BIOPSY Left 2016   Positive  . BREAST LUMPECTOMY WITH SENTINEL LYMPH NODE BIOPSY Left 05/23/2015   Procedure: LEFT BREAST WIDE EXCISION WITH AXILLARY DISSECTION, MASTOPLASTY ;  Surgeon: JeRobert BellowMD;  Location: [facility] ORS;  Service: General;  Laterality: Left;  . BREAST SURGERY Left 12/18/14   breast biopsy/INVASIVE DUCTAL CARCINOMA OF BREAST, NOTTINGHAM GRADE 2.  . Marland KitchenREAST SURGERY  05/23/2015.   Wide excision/mastoplasty, axillary dissection. No residual invasive cancer, positive for residual DCIS. 0/2 nodes identified on axillary dissection. (no SLN by technetium or methylene blue)  . CARDIAC CATHETERIZATION    . COLONOSCOPY WITH PROPOFOL N/A 12/10/2016  Procedure: COLONOSCOPY WITH PROPOFOL;  Surgeon: Lucilla Lame, MD;  Location: [facility] ENDOSCOPY;  Service: Endoscopy;  Laterality: N/A;  . ESOPHAGOGASTRODUODENOSCOPY (EGD) WITH PROPOFOL N/A 12/08/2016   Procedure: ESOPHAGOGASTRODUODENOSCOPY (EGD) WITH PROPOFOL;  Surgeon: Lucilla Lame, MD;  Location: [facility] ENDOSCOPY;  Service: Endoscopy;  Laterality: N/A;  . PORTACATH PLACEMENT Right 12-31-14   Dr Bary Castilla    FAMILY HISTORY :   Family History  Problem  Relation Age of Onset  . Breast cancer Maternal Aunt   . Breast cancer Cousin   . Brain cancer Maternal Uncle     SOCIAL HISTORY:   Social History  Substance Use Topics  . Smoking status: Light Tobacco Smoker    Packs/day: 0.50    Years: 18.00    Types: Cigarettes  . Smokeless tobacco: Never Used  . Alcohol use No    ALLERGIES:  is allergic to no known allergies.  MEDICATIONS:  Current Outpatient Prescriptions  Medication Sig Dispense Refill  . anastrozole (ARIMIDEX) 1 MG tablet TAKE 1 TABLET DAILY 90 tablet 3  . aspirin 81 MG tablet Take 81 mg by mouth daily.    . carvedilol (COREG) 6.25 MG tablet Take 1 tablet (6.25 mg total) by mouth 2 (two) times daily. 180 tablet 0  . clopidogrel (PLAVIX) 75 MG tablet Take 1 tablet (75 mg total) by mouth daily. 30 tablet 1  . DULoxetine (CYMBALTA) 60 MG capsule TAKE 1 CAPSULE (60 MG TOTAL) BY MOUTH DAILY. 30 capsule 0  . feeding supplement, ENSURE ENLIVE, (ENSURE ENLIVE) LIQD Take 237 mLs by mouth 2 (two) times daily between meals. 237 mL 12  . KLOR-CON M20 20 MEQ tablet Take 20 mEq by mouth 2 (two) times daily.  3  . pantoprazole (PROTONIX) 40 MG tablet Take 1 tablet (40 mg total) by mouth 2 (two) times daily. 60 tablet 2  . diphenoxylate-atropine (LOMOTIL) 2.5-0.025 MG tablet Take 1 tablet by mouth 4 (four) times daily as needed for diarrhea or loose stools. Take it along with immodium (Patient not taking: Reported on 12/17/2016) 60 tablet 0  . ondansetron (ZOFRAN) 8 MG tablet Take 1 tablet (8 mg total) by mouth every 8 (eight) hours as needed for nausea or vomiting (start 3 days; after chemo). (Patient not taking: Reported on 12/17/2016) 40 tablet 1  . polyethylene glycol (MIRALAX / GLYCOLAX) packet Take 17 g by mouth daily. (Patient not taking: Reported on 01/07/2017) 14 each 0  . prochlorperazine (COMPAZINE) 10 MG tablet Take 1 tablet (10 mg total) by mouth every 6 (six) hours as needed for nausea or vomiting. (Patient not taking: Reported on  12/17/2016) 40 tablet 1  . senna-docusate (SENOKOT-S) 8.6-50 MG tablet Take 1 tablet by mouth 2 (two) times daily. (Patient not taking: Reported on 01/07/2017) 60 tablet 0   No current facility-administered medications for this visit.    Facility-Administered Medications Ordered in Other Visits  Medication Dose Route Frequency Provider Last Rate Last Dose  . 0.9 %  sodium chloride infusion   Intravenous Continuous [name], MD      . 0.9 %  sodium chloride infusion   Intravenous Continuous Lloyd Huger, MD 999 mL/hr at 04/24/15 1520    . eriBULin mesylate (HALAVEN) 2.5 mg in sodium chloride 0.9 % 100 mL chemo infusion  1.4 mg/m2 (Order-Specific) Intravenous Once Cammie Sickle, MD      . heparin lock flush 100 unit/mL  500 Units Intravenous Once Dmitriy Berenzon, MD      . sodium chloride 0.9 % injection  10 mL  10 mL Intravenous PRN [name], MD   10 mL at 04/04/15 1440  . sodium chloride flush (NS) 0.9 % injection 10 mL  10 mL Intravenous PRN Dmitriy Berenzon, MD      . Tbo-Filgrastim (GRANIX) injection 480 mcg  480 mcg Subcutaneous Once Cammie Sickle, MD        PHYSICAL EXAMINATION: ECOG PERFORMANCE STATUS: 0 - Asymptomatic  BP 112/81 (BP Location: Right Arm, Patient Position: Sitting)   Pulse (!) 110   Temp 97.1 F (36.2 C) (Tympanic)   Resp 18   Ht 5' 6"  (1.676 m)   Wt 148 lb (67.1 kg)   BMI 23.89 kg/m   Filed Weights   01/07/17 0855  Weight: 148 lb (67.1 kg)    GENERAL: Well-nourished well-developed; Alert, no distress and comfortable.  EYES: no pallor or icterus. She is in a wheelchair.She is alone.  OROPHARYNX: no thrush or ulceration; good dentition  NECK: supple, no masses felt LYMPH:  no palpable lymphadenopathy in the cervical, axillary or inguinal regions LUNGS: clear to auscultation and  No wheeze or crackles HEART/CVS: regular rate & rhythm and no murmurs; 1+ bilateral lower extremity edema.  ABDOMEN:abdomen soft, non-tender and  normal bowel sounds Musculoskeletal:no cyanosis of digits and no clubbing  PSYCH: alert & oriented x 3 with fluent speech NEURO: no focal motor/sensory deficits   LABORATORY DATA:  I have reviewed the data as listed    Component Value Date/Time   NA 136 01/07/2017 0814   NA 135 01/08/2015 0850   K 3.6 01/07/2017 0814   K 3.7 01/08/2015 0850   CL 101 01/07/2017 0814   CL 101 01/08/2015 0850   CO2 28 01/07/2017 0814   CO2 26 01/08/2015 0850   GLUCOSE 91 01/07/2017 0814   GLUCOSE 161 (H) 01/08/2015 0850   BUN 11 01/07/2017 0814   BUN 17 01/08/2015 0850   CREATININE 0.53 01/07/2017 0814   CREATININE 0.82 01/22/2015 1559   CALCIUM 8.4 (L) 01/07/2017 0814   CALCIUM 9.3 01/08/2015 0850   PROT 6.5 01/07/2017 0814   PROT 6.8 01/22/2015 1559   ALBUMIN 2.6 (L) 01/07/2017 0814   ALBUMIN 3.9 01/22/2015 1559   AST 54 (H) 01/07/2017 0814   AST 34 01/22/2015 1559   ALT 32 01/07/2017 0814   ALT 42 01/22/2015 1559   ALKPHOS 317 (H) 01/07/2017 0814   ALKPHOS 157 (H) 01/22/2015 1559   BILITOT 1.3 (H) 01/07/2017 0814   BILITOT 0.2 (L) 01/22/2015 1559   GFRNONAA >60 01/07/2017 0814   GFRNONAA >60 01/22/2015 1559   GFRAA >60 01/07/2017 0814   GFRAA >60 01/22/2015 1559    No results found for: SPEP, UPEP  Lab Results  Component Value Date   WBC 3.8 01/07/2017   NEUTROABS 2.9 01/07/2017   HGB 10.5 (L) 01/07/2017   HCT 31.5 (L) 01/07/2017   MCV 90.2 01/07/2017   PLT 251 01/07/2017      Chemistry      Component Value Date/Time   NA 136 01/07/2017 0814   NA 135 01/08/2015 0850   K 3.6 01/07/2017 0814   K 3.7 01/08/2015 0850   CL 101 01/07/2017 0814   CL 101 01/08/2015 0850   CO2 28 01/07/2017 0814   CO2 26 01/08/2015 0850   BUN 11 01/07/2017 0814   BUN 17 01/08/2015 0850   CREATININE 0.53 01/07/2017 0814   CREATININE 0.82 01/22/2015 1559      Component Value Date/Time   CALCIUM 8.4 (L)  01/07/2017 0814   CALCIUM 9.3 01/08/2015 0850   ALKPHOS 317 (H) 01/07/2017 0814    ALKPHOS 157 (H) 01/22/2015 1559   AST 54 (H) 01/07/2017 0814   AST 34 01/22/2015 1559   ALT 32 01/07/2017 0814   ALT 42 01/22/2015 1559   BILITOT 1.3 (H) 01/07/2017 0814   BILITOT 0.2 (L) 01/22/2015 1559            LABORATORY DATA:  I have reviewed the data as listed    Component Value Date/Time   NA 136 01/07/2017 0814   NA 135 01/08/2015 0850   K 3.6 01/07/2017 0814   K 3.7 01/08/2015 0850   CL 101 01/07/2017 0814   CL 101 01/08/2015 0850   CO2 28 01/07/2017 0814   CO2 26 01/08/2015 0850   GLUCOSE 91 01/07/2017 0814   GLUCOSE 161 (H) 01/08/2015 0850   BUN 11 01/07/2017 0814   BUN 17 01/08/2015 0850   CREATININE 0.53 01/07/2017 0814   CREATININE 0.82 01/22/2015 1559   CALCIUM 8.4 (L) 01/07/2017 0814   CALCIUM 9.3 01/08/2015 0850   PROT 6.5 01/07/2017 0814   PROT 6.8 01/22/2015 1559   ALBUMIN 2.6 (L) 01/07/2017 0814   ALBUMIN 3.9 01/22/2015 1559   AST 54 (H) 01/07/2017 0814   AST 34 01/22/2015 1559   ALT 32 01/07/2017 0814   ALT 42 01/22/2015 1559   ALKPHOS 317 (H) 01/07/2017 0814   ALKPHOS 157 (H) 01/22/2015 1559   BILITOT 1.3 (H) 01/07/2017 0814   BILITOT 0.2 (L) 01/22/2015 1559   GFRNONAA >60 01/07/2017 0814   GFRNONAA >60 01/22/2015 1559   GFRAA >60 01/07/2017 0814   GFRAA >60 01/22/2015 1559    No results found for: SPEP, UPEP  Lab Results  Component Value Date   WBC 3.8 01/07/2017   NEUTROABS 2.9 01/07/2017   HGB 10.5 (L) 01/07/2017   HCT 31.5 (L) 01/07/2017   MCV 90.2 01/07/2017   PLT 251 01/07/2017      Chemistry      Component Value Date/Time   NA 136 01/07/2017 0814   NA 135 01/08/2015 0850   K 3.6 01/07/2017 0814   K 3.7 01/08/2015 0850   CL 101 01/07/2017 0814   CL 101 01/08/2015 0850   CO2 28 01/07/2017 0814   CO2 26 01/08/2015 0850   BUN 11 01/07/2017 0814   BUN 17 01/08/2015 0850   CREATININE 0.53 01/07/2017 0814   CREATININE 0.82 01/22/2015 1559      Component Value Date/Time   CALCIUM 8.4 (L) 01/07/2017 0814    CALCIUM 9.3 01/08/2015 0850   ALKPHOS 317 (H) 01/07/2017 0814   ALKPHOS 157 (H) 01/22/2015 1559   AST 54 (H) 01/07/2017 0814   AST 34 01/22/2015 1559   ALT 32 01/07/2017 0814   ALT 42 01/22/2015 1559   BILITOT 1.3 (H) 01/07/2017 0814   BILITOT 0.2 (L) 01/22/2015 1559     IMPRESSION: Normal left ventricular wall motion with estimated ejection fraction of 65%.   Electronically Signed   By: Marijo Sanes M.D.   On: 07/27/2016 09:04  RADIOGRAPHIC STUDIES: I have personally reviewed the radiological images as listed and agreed with the findings in the report. No results found.   ASSESSMENT & PLAN:  Carcinoma of upper-outer quadrant of left breast in female, estrogen receptor positive ([place]) # RECURRENT Metastatic breast cancer- ER/PR positive HER-2/neu negative [status post liver biopsy February 2018; previously HER-2/neu positive]. # s/p  Eribulin day 1  appx 2 week  ago. Clinical response noted; however severe neutropenia [C discussion below]  # proceed with cycle #2- day-1. Reduce dose of chemotherapy to 1 mg/m on day 1; continue 1.4 mg/m on day 8. Add onpro.  # Growth factor-Neulasta/On pro would be given as prophylaxis for chemotherapy-induced neutropenia to prevent febrile neutropenias.  # Brain mets- s/p RT [finished Feb 28th]; symptomatic improvement.   will plan to get MRI in 2-3 months;   # Bilateral lower extremities swelling- dependent edema/steroids/ improved. Continue  leg elevation  # PN G-1-2 Continue neurontin/cymblata.   # Elevated LFTs- secondary to involvement of the liver/ AST ALT improving.  # 1 week labs/ chemo eribulin; 3 weeks/labs/chemo.   Orders Placed This Encounter  Procedures  . CBC with Differential/Platelet    Standing Status:   Future    Standing Expiration Date:   01/07/2018  . Basic metabolic panel    Standing Status:   Future    Standing Expiration Date:   01/07/2018  . CBC with Differential/Platelet    Standing Status:   Future     Standing Expiration Date:   01/07/2018  . Comprehensive metabolic panel    Standing Status:   Future    Standing Expiration Date:   01/07/2018  . CEA    Standing Status:   Future    Standing Expiration Date:   01/07/2018  . Cancer antigen 27.29    Standing Status:   Future    Standing Expiration Date:   01/07/2018   All questions were answered. The patient knows to call the clinic with any problems, questions or concerns.      Cammie Sickle, MD 01/07/2017 10:24 AM

## 2017-01-08 ENCOUNTER — Other Ambulatory Visit: Payer: Self-pay

## 2017-01-08 ENCOUNTER — Telehealth: Payer: Self-pay | Admitting: *Deleted

## 2017-01-08 DIAGNOSIS — F17210 Nicotine dependence, cigarettes, uncomplicated: Secondary | ICD-10-CM | POA: Diagnosis not present

## 2017-01-08 DIAGNOSIS — C7951 Secondary malignant neoplasm of bone: Secondary | ICD-10-CM | POA: Diagnosis not present

## 2017-01-08 DIAGNOSIS — K270 Acute peptic ulcer, site unspecified, with hemorrhage: Secondary | ICD-10-CM | POA: Diagnosis not present

## 2017-01-08 DIAGNOSIS — I10 Essential (primary) hypertension: Secondary | ICD-10-CM | POA: Diagnosis not present

## 2017-01-08 DIAGNOSIS — G620 Drug-induced polyneuropathy: Secondary | ICD-10-CM | POA: Diagnosis not present

## 2017-01-08 DIAGNOSIS — C50912 Malignant neoplasm of unspecified site of left female breast: Secondary | ICD-10-CM | POA: Diagnosis not present

## 2017-01-08 DIAGNOSIS — C7931 Secondary malignant neoplasm of brain: Secondary | ICD-10-CM | POA: Diagnosis not present

## 2017-01-08 DIAGNOSIS — Z7982 Long term (current) use of aspirin: Secondary | ICD-10-CM | POA: Diagnosis not present

## 2017-01-08 DIAGNOSIS — C787 Secondary malignant neoplasm of liver and intrahepatic bile duct: Secondary | ICD-10-CM | POA: Diagnosis not present

## 2017-01-08 LAB — CANCER ANTIGEN 27.29: CA 27.29: 234.1 U/mL — ABNORMAL HIGH (ref 0.0–38.6)

## 2017-01-08 LAB — TSH: TSH: 0.877 u[IU]/mL (ref 0.450–4.500)

## 2017-01-08 LAB — CEA: CEA: 6.4 ng/mL — ABNORMAL HIGH (ref 0.0–4.7)

## 2017-01-08 LAB — MAGNESIUM: Magnesium: 1.7 mg/dL (ref 1.6–2.3)

## 2017-01-08 MED ORDER — MAGNESIUM 400 MG PO TABS: 1.0000 | ORAL_TABLET | Freq: Every day | ORAL | 0 refills | Status: DC

## 2017-01-08 NOTE — Telephone Encounter (Signed)
Patient called-would like to request for cancer [facility] transportation if at all possible. Msg sent to [facility] in social work to approve pt request. Pt has brain mets and is unable to drive herself to the md apts. Her transportation with caregivers is inconsistent.

## 2017-01-08 NOTE — Telephone Encounter (Signed)
States she did not get her chemo patch yesterday. WHen checking chart, ON states she was to get On Pro, but orders are not seen in computer for her to get it. Please advise

## 2017-01-08 NOTE — Telephone Encounter (Signed)
Patient informed per VO Dr Rogue Bussing that the On Pro will only be every other week and it will begin next week with her treatment

## 2017-01-11 ENCOUNTER — Telehealth: Payer: Self-pay | Admitting: Cardiovascular Disease

## 2017-01-11 ENCOUNTER — Emergency Department
Admission: EM | Admit: 2017-01-11 | Discharge: 2017-01-12 | Disposition: A | Discharge status: Home / Self Care | Payer: BLUE CROSS/BLUE SHIELD | Source: Home / Self Care | Attending: Emergency Medicine | Admitting: Emergency Medicine

## 2017-01-11 ENCOUNTER — Other Ambulatory Visit: Payer: Self-pay

## 2017-01-11 ENCOUNTER — Encounter: Payer: Self-pay | Admitting: *Deleted

## 2017-01-11 DIAGNOSIS — E876 Hypokalemia: Secondary | ICD-10-CM | POA: Diagnosis not present

## 2017-01-11 DIAGNOSIS — R531 Weakness: Secondary | ICD-10-CM

## 2017-01-11 DIAGNOSIS — E860 Dehydration: Secondary | ICD-10-CM | POA: Diagnosis not present

## 2017-01-11 DIAGNOSIS — Z7902 Long term (current) use of antithrombotics/antiplatelets: Secondary | ICD-10-CM | POA: Diagnosis not present

## 2017-01-11 DIAGNOSIS — I959 Hypotension, unspecified: Secondary | ICD-10-CM | POA: Diagnosis not present

## 2017-01-11 DIAGNOSIS — T451X5A Adverse effect of antineoplastic and immunosuppressive drugs, initial encounter: Secondary | ICD-10-CM | POA: Insufficient documentation

## 2017-01-11 DIAGNOSIS — F329 Major depressive disorder, single episode, unspecified: Secondary | ICD-10-CM | POA: Diagnosis not present

## 2017-01-11 DIAGNOSIS — I10 Essential (primary) hypertension: Secondary | ICD-10-CM | POA: Insufficient documentation

## 2017-01-11 DIAGNOSIS — R110 Nausea: Secondary | ICD-10-CM | POA: Insufficient documentation

## 2017-01-11 DIAGNOSIS — R0902 Hypoxemia: Secondary | ICD-10-CM | POA: Diagnosis not present

## 2017-01-11 DIAGNOSIS — I110 Hypertensive heart disease with heart failure: Secondary | ICD-10-CM | POA: Diagnosis not present

## 2017-01-11 DIAGNOSIS — I252 Old myocardial infarction: Secondary | ICD-10-CM | POA: Diagnosis not present

## 2017-01-11 DIAGNOSIS — C787 Secondary malignant neoplasm of liver and intrahepatic bile duct: Secondary | ICD-10-CM | POA: Diagnosis not present

## 2017-01-11 DIAGNOSIS — F17210 Nicotine dependence, cigarettes, uncomplicated: Secondary | ICD-10-CM | POA: Diagnosis not present

## 2017-01-11 DIAGNOSIS — Z170 Estrogen receptor positive status [ER+]: Secondary | ICD-10-CM | POA: Diagnosis not present

## 2017-01-11 DIAGNOSIS — E785 Hyperlipidemia, unspecified: Secondary | ICD-10-CM | POA: Diagnosis not present

## 2017-01-11 DIAGNOSIS — Z79899 Other long term (current) drug therapy: Secondary | ICD-10-CM | POA: Diagnosis not present

## 2017-01-11 DIAGNOSIS — Z803 Family history of malignant neoplasm of breast: Secondary | ICD-10-CM | POA: Diagnosis not present

## 2017-01-11 DIAGNOSIS — C50912 Malignant neoplasm of unspecified site of left female breast: Secondary | ICD-10-CM | POA: Diagnosis not present

## 2017-01-11 DIAGNOSIS — Z66 Do not resuscitate: Secondary | ICD-10-CM | POA: Diagnosis not present

## 2017-01-11 DIAGNOSIS — C50412 Malignant neoplasm of upper-outer quadrant of left female breast: Secondary | ICD-10-CM | POA: Diagnosis not present

## 2017-01-11 DIAGNOSIS — K1230 Oral mucositis (ulcerative), unspecified: Secondary | ICD-10-CM | POA: Diagnosis not present

## 2017-01-11 DIAGNOSIS — G620 Drug-induced polyneuropathy: Secondary | ICD-10-CM | POA: Diagnosis not present

## 2017-01-11 DIAGNOSIS — Z9221 Personal history of antineoplastic chemotherapy: Secondary | ICD-10-CM | POA: Diagnosis not present

## 2017-01-11 DIAGNOSIS — I471 Supraventricular tachycardia: Secondary | ICD-10-CM | POA: Diagnosis not present

## 2017-01-11 DIAGNOSIS — Z7982 Long term (current) use of aspirin: Secondary | ICD-10-CM | POA: Diagnosis not present

## 2017-01-11 DIAGNOSIS — C7931 Secondary malignant neoplasm of brain: Secondary | ICD-10-CM | POA: Diagnosis not present

## 2017-01-11 DIAGNOSIS — C7951 Secondary malignant neoplasm of bone: Secondary | ICD-10-CM | POA: Diagnosis not present

## 2017-01-11 DIAGNOSIS — I5031 Acute diastolic (congestive) heart failure: Secondary | ICD-10-CM | POA: Diagnosis not present

## 2017-01-11 DIAGNOSIS — I2699 Other pulmonary embolism without acute cor pulmonale: Secondary | ICD-10-CM | POA: Diagnosis not present

## 2017-01-11 DIAGNOSIS — K270 Acute peptic ulcer, site unspecified, with hemorrhage: Secondary | ICD-10-CM | POA: Diagnosis not present

## 2017-01-11 DIAGNOSIS — Z853 Personal history of malignant neoplasm of breast: Secondary | ICD-10-CM | POA: Insufficient documentation

## 2017-01-11 DIAGNOSIS — I2782 Chronic pulmonary embolism: Secondary | ICD-10-CM | POA: Diagnosis not present

## 2017-01-11 LAB — URINALYSIS, COMPLETE (UACMP) WITH MICROSCOPIC
BILIRUBIN URINE: NEGATIVE
Bacteria, UA: NONE SEEN
GLUCOSE, UA: NEGATIVE mg/dL
Hgb urine dipstick: NEGATIVE
KETONES UR: NEGATIVE mg/dL
Leukocytes, UA: NEGATIVE
Nitrite: NEGATIVE
PH: 6 (ref 5.0–8.0)
Protein, ur: NEGATIVE mg/dL
Specific Gravity, Urine: 1.015 (ref 1.005–1.030)

## 2017-01-11 LAB — CBC WITH DIFFERENTIAL/PLATELET
BASOS PCT: 0 %
Basophils Absolute: 0 10*3/uL (ref 0–0.1)
Eosinophils Absolute: 0 10*3/uL (ref 0–0.7)
Eosinophils Relative: 0 %
HEMATOCRIT: 33.2 % — AB (ref 35.0–47.0)
Hemoglobin: 11.1 g/dL — ABNORMAL LOW (ref 12.0–16.0)
Lymphocytes Relative: 9 %
Lymphs Abs: 0.3 10*3/uL — ABNORMAL LOW (ref 1.0–3.6)
MCH: 30.3 pg (ref 26.0–34.0)
MCHC: 33.5 g/dL (ref 32.0–36.0)
MCV: 90.5 fL (ref 80.0–100.0)
MONO ABS: 0 10*3/uL — AB (ref 0.2–0.9)
MONOS PCT: 1 %
NEUTROS ABS: 3.1 10*3/uL (ref 1.4–6.5)
Neutrophils Relative %: 90 %
Platelets: 187 10*3/uL (ref 150–440)
RBC: 3.66 MIL/uL — ABNORMAL LOW (ref 3.80–5.20)
RDW: 23 % — AB (ref 11.5–14.5)
WBC: 3.5 10*3/uL — ABNORMAL LOW (ref 3.6–11.0)

## 2017-01-11 LAB — TROPONIN I
Troponin I: 0.04 ng/mL (ref <0.03)
Troponin I: 0.05 ng/mL (ref <0.03)
Troponin I: 0.05 ng/mL (ref <0.03)

## 2017-01-11 LAB — COMPREHENSIVE METABOLIC PANEL
ALBUMIN: 2.5 g/dL — AB (ref 3.5–5.0)
ALT: 30 U/L (ref 14–54)
AST: 88 U/L — ABNORMAL HIGH (ref 15–41)
Alkaline Phosphatase: 246 U/L — ABNORMAL HIGH (ref 38–126)
Anion gap: 8 (ref 5–15)
BILIRUBIN TOTAL: 1.5 mg/dL — AB (ref 0.3–1.2)
BUN: 8 mg/dL (ref 6–20)
CALCIUM: 8.3 mg/dL — AB (ref 8.9–10.3)
CO2: 26 mmol/L (ref 22–32)
Chloride: 96 mmol/L — ABNORMAL LOW (ref 101–111)
Creatinine, Ser: 0.53 mg/dL (ref 0.44–1.00)
GLUCOSE: 105 mg/dL — AB (ref 65–99)
POTASSIUM: 3.3 mmol/L — AB (ref 3.5–5.1)
Sodium: 130 mmol/L — ABNORMAL LOW (ref 135–145)
TOTAL PROTEIN: 6.5 g/dL (ref 6.5–8.1)

## 2017-01-11 LAB — LACTIC ACID, PLASMA: Lactic Acid, Venous: 1.7 mmol/L (ref 0.5–1.9)

## 2017-01-11 MED ORDER — ACETAMINOPHEN 500 MG PO TABS
ORAL_TABLET | ORAL | Status: AC
  Administered 2017-01-11: 1000 mg via ORAL
  Filled 2017-01-11: qty 2

## 2017-01-11 MED ORDER — ACETAMINOPHEN 500 MG PO TABS
1000.0000 mg | ORAL_TABLET | Freq: Once | ORAL | Status: AC
  Administered 2017-01-11: 1000 mg via ORAL

## 2017-01-11 MED ORDER — SODIUM CHLORIDE 0.9 % IV BOLUS (SEPSIS)
1000.0000 mL | Freq: Once | INTRAVENOUS | Status: AC
  Administered 2017-01-11: 1000 mL via INTRAVENOUS

## 2017-01-11 NOTE — Telephone Encounter (Addendum)
Spoke with physical therapist that was working with patient today. She states that when she arrived patients initial blood pressure was 84/64 HR 126 and then 30 minutes later her blood pressure was 70/57 HR 126 at rest. She states that the patient has not taken any medications so far today. She has not been taking the metoprolol due to low blood pressures and she has been dizzy and not feeling well. Let her know that I would speak with Dr. Fletcher Anon and then call back with any recommendations.

## 2017-01-11 NOTE — Telephone Encounter (Signed)
Jatana from [facility] calling re BP concerns   Patient having low bp high HR  Seen in office on Thursday given metoprolol.  Patient was told not to take if bp under 100  Resting bp today 84/64  HR 126   Patient relaxed for 30 min  BP 70/57 HR 126   (this was digital nurse said she used her cuff and couldn't hear at all)  Patient c/o one spell of dizziness x 1 episode.

## 2017-01-11 NOTE — Telephone Encounter (Signed)
Spoke with Dr. Fletcher Anon regarding patients low blood pressures and symptoms. Called back and spoke with physical therapist and instructed her to have patient go to the emergency room given that she is symptomatic and blood pressures are running low with elevated heart rates. She verbalized understanding and stated that she would pass this information on to the patient. She had no further questions at this time.

## 2017-01-11 NOTE — Discharge Instructions (Addendum)
Please follow-up with your doctor tomorrow for further evaluation. Return to the emergency room if you're feeling dizzy, if you have chest pain, if you develop any fever, shortness of breath, abdominal pain, or any other symptoms that were not present during this visit.

## 2017-01-11 NOTE — ED Triage Notes (Signed)
Pt to triage via wheelchair. Pt has low blood pressure today and feels weak.  Pt has breast cancer    Chemo last week.  Pt has a portacath.  Pt alert.

## 2017-01-11 NOTE — ED Provider Notes (Signed)
[facility] Emergency Department Provider Note  ____________________________________________  Time seen: Approximately 4:35 PM  I have reviewed the triage vital signs and the nursing notes.   HISTORY  Chief Complaint Hypotension   HPI Sarah Horne is a 61 y.o. female with a history of metastatic breast cancer on chemotherapy, hypertension, hyperlipidemia, and cardiomyopathy who presents for evaluation of hypotension. Patient received her last chemotherapy 3 days ago. She reports that she usually feels tired and weak for a couple of days after her chemotherapy. Today home health nurse went to check on her and found that she was hypotensive with systolics in the 42V. They called patient's oncologist who recommended her come to the emergency room. Patient reports that she has been more fatigued and with less appetite than normal for the last 2 days, not eating or drinking enough since Friday. No fever or chills, no body aches, no nausea, vomiting, diarrhea, dysuria, chest pain, shortness of breath, or abdominal pain. Patient reports that she feels very dehydrated.  Past Medical History:  Diagnosis Date  . Breast cancer ([place]) 2016   Left-radiation  . Cancer ([employer]) 2016   Stage II, ER positive, PR positive, HER-2/neu overexpressing of the left breast.  . Chemotherapy-induced peripheral neuropathy ([place])   . Heart attack ([place]) 1998  . Hyperlipidemia   . Hypertension   . Neuromuscular disorder ([facility])    neuropathy  . Neuropathy     Patient Active Problem List   Diagnosis Date Noted  . Pulmonary emboli ([place]) 01/12/2017  . Dehydration 12/25/2016  . Blood loss anemia 12/15/2016  . Ulceration of colon   . Heartburn   . Duodenitis   . Gastritis without bleeding   . Blood in stool   . Acute esophagitis   . Acute esophagogastric ulcer   . Rectal bleed   . Hypovolemic shock ([place])   . Ischemic leg 12/04/2016  . Chemotherapy induced neutropenia ([place])  11/20/2016  . Palliative care by specialist   . Goals of care, counseling/discussion   . DNR (do not resuscitate) discussion   . Cerebral edema ([place]) 11/10/2016  . Encounter for monitoring cardiotoxic drug therapy 11/09/2016  . Elevated LFTs 10/20/2016  . URI with cough and congestion 08/25/2016  . Peripheral neuropathy due to chemotherapy ([place]) 03/13/2016  . Encounter for antineoplastic chemotherapy 03/13/2016  . Hyperlipidemia, unspecified 01/29/2016  . Hyperglycemia, unspecified 01/29/2016  . Cardiomyopathy ([place]) 01/13/2016  . Hypertension 07/18/2015  . Carcinoma of upper-outer quadrant of left breast in female, estrogen receptor positive ([place]) 12/27/2014    Past Surgical History:  Procedure Laterality Date  . BREAST BIOPSY Left 2016   Positive  . BREAST LUMPECTOMY WITH SENTINEL LYMPH NODE BIOPSY Left 05/23/2015   Procedure: LEFT BREAST WIDE EXCISION WITH AXILLARY DISSECTION, MASTOPLASTY ;  Surgeon: [name], MD;  Location: [facility] ORS;  Service: General;  Laterality: Left;  . BREAST SURGERY Left 12/18/14   breast biopsy/INVASIVE DUCTAL CARCINOMA OF BREAST, NOTTINGHAM GRADE 2.  Marland Kitchen BREAST SURGERY  05/23/2015.   Wide excision/mastoplasty, axillary dissection. No residual invasive cancer, positive for residual DCIS. 0/2 nodes identified on axillary dissection. (no SLN by technetium or methylene blue)  . CARDIAC CATHETERIZATION    . COLONOSCOPY WITH PROPOFOL N/A 12/10/2016   Procedure: COLONOSCOPY WITH PROPOFOL;  Surgeon: Lucilla Lame, MD;  Location: [facility] ENDOSCOPY;  Service: Endoscopy;  Laterality: N/A;  . ESOPHAGOGASTRODUODENOSCOPY (EGD) WITH PROPOFOL N/A 12/08/2016   Procedure: ESOPHAGOGASTRODUODENOSCOPY (EGD) WITH PROPOFOL;  Surgeon: Lucilla Lame, MD;  Location: [facility]  ENDOSCOPY;  Service: Endoscopy;  Laterality: N/A;  . PORTACATH PLACEMENT Right 12-31-14   Dr Bary Castilla    Prior to Admission medications   Medication Sig Start Date End Date Taking? Authorizing Provider  anastrozole  (ARIMIDEX) 1 MG tablet TAKE 1 TABLET DAILY 11/04/16  Yes Cammie Sickle, MD  aspirin 81 MG tablet Take 81 mg by mouth daily.   Yes Historical Provider, MD  clopidogrel (PLAVIX) 75 MG tablet Take 1 tablet (75 mg total) by mouth daily. Patient not taking: Reported on 01/12/2017 12/07/16  Yes Fritzi Mandes, MD  DULoxetine (CYMBALTA) 60 MG capsule TAKE 1 CAPSULE (60 MG TOTAL) BY MOUTH DAILY. 12/23/16  Yes Cammie Sickle, MD  feeding supplement, ENSURE ENLIVE, (ENSURE ENLIVE) LIQD Take 237 mLs by mouth 2 (two) times daily between meals. 12/07/16  Yes Fritzi Mandes, MD  KLOR-CON M20 20 MEQ tablet Take 20 mEq by mouth 2 (two) times daily. 11/26/16  Yes Historical Provider, MD  metoprolol tartrate (LOPRESSOR) 25 MG tablet Take 0.5 tablets (12.5 mg total) by mouth 2 (two) times daily. Hold if you're Systolic Blood pressure is less than 100. 01/07/17  Yes Ryan M Dunn, PA-C  pantoprazole (PROTONIX) 40 MG tablet Take 1 tablet (40 mg total) by mouth 2 (two) times daily. 12/11/16  Yes Fritzi Mandes, MD  polyethylene glycol (MIRALAX / GLYCOLAX) packet Take 17 g by mouth daily. 12/07/16  Yes Fritzi Mandes, MD  diphenoxylate-atropine (LOMOTIL) 2.5-0.025 MG tablet Take 1 tablet by mouth 4 (four) times daily as needed for diarrhea or loose stools. Take it along with immodium 09/07/16   Cammie Sickle, MD  Magnesium 400 MG TABS Take 1 tablet by mouth daily. Patient not taking: Reported on 01/12/2017 01/08/17   Areta Haber Dunn, PA-C  ondansetron (ZOFRAN) 8 MG tablet Take 1 tablet (8 mg total) by mouth every 8 (eight) hours as needed for nausea or vomiting (start 3 days; after chemo). 11/26/16   Cammie Sickle, MD    Allergies No known allergies  Family History  Problem Relation Age of Onset  . Breast cancer Maternal Aunt   . Breast cancer Cousin   . Brain cancer Maternal Uncle     Social History Social History  Substance Use Topics  . Smoking status: Light Tobacco Smoker    Packs/day: 0.50    Years: 18.00     Types: Cigarettes  . Smokeless tobacco: Never Used  . Alcohol use No    Review of Systems  Constitutional: Negative for fever. + generalized weakness Eyes: Negative for visual changes. ENT: Negative for sore throat. Neck: No neck pain  Cardiovascular: Negative for chest pain. Respiratory: Negative for shortness of breath. Gastrointestinal: Negative for abdominal pain, vomiting or diarrhea. + decreased appetite Genitourinary: Negative for dysuria. Musculoskeletal: Negative for back pain. Skin: Negative for rash. Neurological: Negative for headaches, weakness or numbness. Psych: No SI or HI  ____________________________________________   PHYSICAL EXAM:  VITAL SIGNS: ED Triage Vitals  Enc Vitals Group     BP 01/11/17 1618 (!) 86/60     Pulse Rate 01/11/17 1618 (!) 130     Resp 01/11/17 1618 20     Temp 01/11/17 1618 99 F (37.2 C)     Temp Source 01/11/17 1618 Oral     SpO2 01/11/17 1618 100 %     Weight 01/11/17 1620 150 lb (68 kg)     Height 01/11/17 1620 5' 6"  (1.676 m)     Head Circumference --  Peak Flow --      Pain Score --      Pain Loc --      Pain Edu? --      Excl. in [place]? --     Constitutional: Alert and oriented. Well appearing and in no apparent distress. HEENT:      Head: Normocephalic and atraumatic.         Eyes: Conjunctivae are normal. Sclera is non-icteric. EOMI. PERRL      Mouth/Throat: Mucous membranes are dry.       Neck: Supple with no signs of meningismus. Cardiovascular: Tachycardic rate and regular rhythm. No murmurs, gallops, or rubs. 2+ symmetrical distal pulses are present in all extremities. No JVD. Respiratory: Normal respiratory effort. Lungs are clear to auscultation bilaterally. No wheezes, crackles, or rhonchi.  Gastrointestinal: Soft, non tender, and non distended with positive bowel sounds. No rebound or guarding. Musculoskeletal: Nontender with normal range of motion in all extremities. No edema, cyanosis, or erythema of  extremities. Neurologic: Normal speech and language. Face is symmetric. Moving all extremities. No gross focal neurologic deficits are appreciated. Skin: Skin is warm, dry and intact. No rash noted. Psychiatric: Mood and affect are normal. Speech and behavior are normal.  ____________________________________________   LABS (all labs ordered are listed, but only abnormal results are displayed)  Labs Reviewed  CBC WITH DIFFERENTIAL/PLATELET - Abnormal; Notable for the following:       Result Value   WBC 3.5 (*)    RBC 3.66 (*)    Hemoglobin 11.1 (*)    HCT 33.2 (*)    RDW 23.0 (*)    Lymphs Abs 0.3 (*)    Monocytes Absolute 0.0 (*)    All other components within normal limits  COMPREHENSIVE METABOLIC PANEL - Abnormal; Notable for the following:    Sodium 130 (*)    Potassium 3.3 (*)    Chloride 96 (*)    Glucose, Bld 105 (*)    Calcium 8.3 (*)    Albumin 2.5 (*)    AST 88 (*)    Alkaline Phosphatase 246 (*)    Total Bilirubin 1.5 (*)    All other components within normal limits  URINALYSIS, COMPLETE (UACMP) WITH MICROSCOPIC - Abnormal; Notable for the following:    Squamous Epithelial / LPF 6-30 (*)    All other components within normal limits  TROPONIN I - Abnormal; Notable for the following:    Troponin I 0.04 (*)    All other components within normal limits  TROPONIN I - Abnormal; Notable for the following:    Troponin I 0.05 (*)    All other components within normal limits  TROPONIN I - Abnormal; Notable for the following:    Troponin I 0.05 (*)    All other components within normal limits  LACTIC ACID, PLASMA   ____________________________________________  EKG  ED ECG REPORT I, Rudene Re, the attending physician, personally viewed and interpreted this ECG.  Sinus tachycardia, rate of 114, normal intervals, right axis deviation, no ST elevations or depressions. No significant changes when compared to prior from March  2017 ____________________________________________  RADIOLOGY  none ____________________________________________   PROCEDURES  Procedure(s) performed: None Procedures Critical Care performed:  None ____________________________________________   INITIAL IMPRESSION / ASSESSMENT AND PLAN / ED COURSE  61 y.o. female with a history of metastatic breast cancer on chemotherapy, hypertension, hyperlipidemia, and cardiomyopathy who presents for evaluation of hypotension in the setting of recent chemo and 3 days of no appetite. Initial  BP here 86/60 however that improved to low 100s when I was evaluating patient in the room. Patient looks dehydrated. PE otherwise with no acute findings. Will give IVF and check labs for electrolyte abnormalities, AKI. Temp 9F on triage which I repeated in the room and it was 98.48F with no intervention.  Clinical Course as of Jan 13 2015  Mon Jan 11, 2017  2011 Patient feels better after IV fluids. Her vital signs remained normal. Her blood work showing normal white count with normal absolute neutrophil count, patient is not neutropenic. Her UA shows no evidence of a urinary tract infection. Kidney function was within normal limits. Troponin was slightly elevated at 0.04. Patient has no CP or SOB. Second troponin is pending. I discussed with patient admission for hydration and monitoring since she is immune suppressed versus discharge home and close follow-up with her doctor. Patient prefers to go home at this time. Her brother is here to take her home. If her second troponin is stable patient will be discharged home. Care has been transferred to Dr. Joni Fears.  [CV]    Clinical Course User Index [CV] Rudene Re, MD    Pertinent labs & imaging results that were available during my care of the patient were reviewed by me and considered in my medical decision making (see chart for details).    ____________________________________________   FINAL CLINICAL  IMPRESSION(S) / ED DIAGNOSES  Final diagnoses:  Dehydration  Weakness  Chemotherapy-induced nausea      NEW MEDICATIONS STARTED DURING THIS VISIT:  Discharge Medication List as of 01/12/2017 12:19 AM       Note:  This document was prepared using Dragon voice recognition software and may include unintentional dictation errors.    Rudene Re, MD 01/12/17 2017

## 2017-01-12 ENCOUNTER — Emergency Department: Payer: BLUE CROSS/BLUE SHIELD

## 2017-01-12 ENCOUNTER — Encounter: Payer: Self-pay | Admitting: Emergency Medicine

## 2017-01-12 ENCOUNTER — Observation Stay
Admission: EM | Admit: 2017-01-12 | Discharge: 2017-01-16 | Disposition: A | Discharge status: Home / Self Care | Payer: BLUE CROSS/BLUE SHIELD | Attending: Specialist | Admitting: Specialist

## 2017-01-12 ENCOUNTER — Other Ambulatory Visit: Payer: Self-pay

## 2017-01-12 ENCOUNTER — Encounter (INDEPENDENT_AMBULATORY_CARE_PROVIDER_SITE_OTHER): Payer: BLUE CROSS/BLUE SHIELD | Admitting: Vascular Surgery

## 2017-01-12 DIAGNOSIS — Z803 Family history of malignant neoplasm of breast: Secondary | ICD-10-CM | POA: Insufficient documentation

## 2017-01-12 DIAGNOSIS — E860 Dehydration: Secondary | ICD-10-CM | POA: Insufficient documentation

## 2017-01-12 DIAGNOSIS — I110 Hypertensive heart disease with heart failure: Secondary | ICD-10-CM | POA: Insufficient documentation

## 2017-01-12 DIAGNOSIS — E785 Hyperlipidemia, unspecified: Secondary | ICD-10-CM | POA: Insufficient documentation

## 2017-01-12 DIAGNOSIS — I5031 Acute diastolic (congestive) heart failure: Secondary | ICD-10-CM | POA: Insufficient documentation

## 2017-01-12 DIAGNOSIS — Z7982 Long term (current) use of aspirin: Secondary | ICD-10-CM | POA: Diagnosis not present

## 2017-01-12 DIAGNOSIS — F17210 Nicotine dependence, cigarettes, uncomplicated: Secondary | ICD-10-CM | POA: Diagnosis not present

## 2017-01-12 DIAGNOSIS — Z79899 Other long term (current) drug therapy: Secondary | ICD-10-CM | POA: Insufficient documentation

## 2017-01-12 DIAGNOSIS — I2699 Other pulmonary embolism without acute cor pulmonale: Secondary | ICD-10-CM | POA: Diagnosis not present

## 2017-01-12 DIAGNOSIS — D701 Agranulocytosis secondary to cancer chemotherapy: Secondary | ICD-10-CM

## 2017-01-12 DIAGNOSIS — R0902 Hypoxemia: Secondary | ICD-10-CM | POA: Insufficient documentation

## 2017-01-12 DIAGNOSIS — R42 Dizziness and giddiness: Secondary | ICD-10-CM

## 2017-01-12 DIAGNOSIS — I2782 Chronic pulmonary embolism: Secondary | ICD-10-CM

## 2017-01-12 DIAGNOSIS — C50912 Malignant neoplasm of unspecified site of left female breast: Secondary | ICD-10-CM | POA: Diagnosis not present

## 2017-01-12 DIAGNOSIS — I252 Old myocardial infarction: Secondary | ICD-10-CM | POA: Insufficient documentation

## 2017-01-12 DIAGNOSIS — I10 Essential (primary) hypertension: Secondary | ICD-10-CM | POA: Diagnosis not present

## 2017-01-12 DIAGNOSIS — C50919 Malignant neoplasm of unspecified site of unspecified female breast: Secondary | ICD-10-CM | POA: Diagnosis not present

## 2017-01-12 DIAGNOSIS — C7931 Secondary malignant neoplasm of brain: Secondary | ICD-10-CM | POA: Diagnosis not present

## 2017-01-12 DIAGNOSIS — I471 Supraventricular tachycardia: Secondary | ICD-10-CM

## 2017-01-12 DIAGNOSIS — E876 Hypokalemia: Secondary | ICD-10-CM | POA: Insufficient documentation

## 2017-01-12 DIAGNOSIS — Z66 Do not resuscitate: Secondary | ICD-10-CM | POA: Insufficient documentation

## 2017-01-12 DIAGNOSIS — T451X5A Adverse effect of antineoplastic and immunosuppressive drugs, initial encounter: Secondary | ICD-10-CM

## 2017-01-12 DIAGNOSIS — C7951 Secondary malignant neoplasm of bone: Secondary | ICD-10-CM | POA: Diagnosis not present

## 2017-01-12 DIAGNOSIS — Z170 Estrogen receptor positive status [ER+]: Secondary | ICD-10-CM | POA: Insufficient documentation

## 2017-01-12 DIAGNOSIS — I959 Hypotension, unspecified: Secondary | ICD-10-CM | POA: Diagnosis not present

## 2017-01-12 DIAGNOSIS — C787 Secondary malignant neoplasm of liver and intrahepatic bile duct: Secondary | ICD-10-CM | POA: Diagnosis not present

## 2017-01-12 DIAGNOSIS — K270 Acute peptic ulcer, site unspecified, with hemorrhage: Secondary | ICD-10-CM | POA: Diagnosis not present

## 2017-01-12 DIAGNOSIS — C50412 Malignant neoplasm of upper-outer quadrant of left female breast: Secondary | ICD-10-CM | POA: Insufficient documentation

## 2017-01-12 DIAGNOSIS — Z923 Personal history of irradiation: Secondary | ICD-10-CM | POA: Insufficient documentation

## 2017-01-12 DIAGNOSIS — G620 Drug-induced polyneuropathy: Secondary | ICD-10-CM | POA: Insufficient documentation

## 2017-01-12 DIAGNOSIS — Z9221 Personal history of antineoplastic chemotherapy: Secondary | ICD-10-CM | POA: Insufficient documentation

## 2017-01-12 DIAGNOSIS — F329 Major depressive disorder, single episode, unspecified: Secondary | ICD-10-CM | POA: Insufficient documentation

## 2017-01-12 DIAGNOSIS — Z7902 Long term (current) use of antithrombotics/antiplatelets: Secondary | ICD-10-CM | POA: Insufficient documentation

## 2017-01-12 DIAGNOSIS — K1230 Oral mucositis (ulcerative), unspecified: Secondary | ICD-10-CM | POA: Insufficient documentation

## 2017-01-12 DIAGNOSIS — R000 Tachycardia, unspecified: Secondary | ICD-10-CM | POA: Diagnosis not present

## 2017-01-12 HISTORY — DX: Other pulmonary embolism without acute cor pulmonale: I26.99

## 2017-01-12 HISTORY — DX: Tachycardia, unspecified: R00.0

## 2017-01-12 HISTORY — DX: Adverse effect of antineoplastic and immunosuppressive drugs, initial encounter: I42.7

## 2017-01-12 HISTORY — DX: Adverse effect of antineoplastic and immunosuppressive drugs, initial encounter: T45.1X5A

## 2017-01-12 HISTORY — DX: Gastrointestinal hemorrhage, unspecified: K92.2

## 2017-01-12 HISTORY — DX: Malignant neoplasm of unspecified site of unspecified female breast: C50.919

## 2017-01-12 HISTORY — DX: Supraventricular tachycardia: I47.1

## 2017-01-12 HISTORY — DX: Peripheral vascular disease, unspecified: I73.9

## 2017-01-12 HISTORY — DX: Epistaxis: R04.0

## 2017-01-12 HISTORY — DX: Supraventricular tachycardia, unspecified: I47.10

## 2017-01-12 LAB — HEPATIC FUNCTION PANEL
ALT: 27 U/L (ref 14–54)
AST: 85 U/L — ABNORMAL HIGH (ref 15–41)
Albumin: 2.3 g/dL — ABNORMAL LOW (ref 3.5–5.0)
Alkaline Phosphatase: 217 U/L — ABNORMAL HIGH (ref 38–126)
BILIRUBIN DIRECT: 0.4 mg/dL (ref 0.1–0.5)
BILIRUBIN INDIRECT: 1.1 mg/dL — AB (ref 0.3–0.9)
TOTAL PROTEIN: 6.1 g/dL — AB (ref 6.5–8.1)
Total Bilirubin: 1.5 mg/dL — ABNORMAL HIGH (ref 0.3–1.2)

## 2017-01-12 LAB — BASIC METABOLIC PANEL
Anion gap: 8 (ref 5–15)
BUN: 9 mg/dL (ref 6–20)
CHLORIDE: 100 mmol/L — AB (ref 101–111)
CO2: 27 mmol/L (ref 22–32)
CREATININE: 0.51 mg/dL (ref 0.44–1.00)
Calcium: 8.1 mg/dL — ABNORMAL LOW (ref 8.9–10.3)
GFR calc non Af Amer: >60 mL/min (ref >60)
GLUCOSE: 151 mg/dL — AB (ref 65–99)
Potassium: 2.9 mmol/L — ABNORMAL LOW (ref 3.5–5.1)
Sodium: 135 mmol/L (ref 135–145)

## 2017-01-12 LAB — CBC
HEMATOCRIT: 30.5 % — AB (ref 35.0–47.0)
HEMOGLOBIN: 10.2 g/dL — AB (ref 12.0–16.0)
MCH: 30.2 pg (ref 26.0–34.0)
MCHC: 33.5 g/dL (ref 32.0–36.0)
MCV: 90.3 fL (ref 80.0–100.0)
Platelets: 157 10*3/uL (ref 150–440)
RBC: 3.38 MIL/uL — ABNORMAL LOW (ref 3.80–5.20)
RDW: 24 % — ABNORMAL HIGH (ref 11.5–14.5)
WBC: 3.3 10*3/uL — ABNORMAL LOW (ref 3.6–11.0)

## 2017-01-12 LAB — BRAIN NATRIURETIC PEPTIDE: B NATRIURETIC PEPTIDE 5: 167 pg/mL — AB (ref 0.0–100.0)

## 2017-01-12 LAB — TSH: TSH: 1.41 u[IU]/mL (ref 0.350–4.500)

## 2017-01-12 LAB — CK: CK TOTAL: 69 U/L (ref 38–234)

## 2017-01-12 LAB — LACTIC ACID, PLASMA: LACTIC ACID, VENOUS: 1.7 mmol/L (ref 0.5–1.9)

## 2017-01-12 LAB — TROPONIN I: Troponin I: 0.06 ng/mL (ref <0.03)

## 2017-01-12 MED ORDER — HEPARIN SOD (PORK) LOCK FLUSH 100 UNIT/ML IV SOLN
500.0000 [IU] | Freq: Once | INTRAVENOUS | Status: AC
  Administered 2017-01-12: 500 [IU] via INTRAVENOUS

## 2017-01-12 MED ORDER — ACETAMINOPHEN 325 MG PO TABS
650.0000 mg | ORAL_TABLET | Freq: Four times a day (QID) | ORAL | Status: DC | PRN
  Administered 2017-01-14: 650 mg via ORAL
  Filled 2017-01-12: qty 2

## 2017-01-12 MED ORDER — METOPROLOL TARTRATE 25 MG PO TABS
12.5000 mg | ORAL_TABLET | Freq: Two times a day (BID) | ORAL | Status: DC
  Administered 2017-01-12 – 2017-01-15 (×6): 12.5 mg via ORAL
  Filled 2017-01-12 (×7): qty 1

## 2017-01-12 MED ORDER — ENOXAPARIN SODIUM 80 MG/0.8ML ~~LOC~~ SOLN
1.0000 mg/kg | Freq: Two times a day (BID) | SUBCUTANEOUS | Status: DC
  Administered 2017-01-13 – 2017-01-14 (×3): 70 mg via SUBCUTANEOUS
  Filled 2017-01-12 (×3): qty 0.8

## 2017-01-12 MED ORDER — SODIUM CHLORIDE 0.9% FLUSH: 3.0000 mL | INTRAVENOUS | Status: DC | PRN

## 2017-01-12 MED ORDER — SODIUM CHLORIDE 0.9% FLUSH
3.0000 mL | Freq: Two times a day (BID) | INTRAVENOUS | Status: DC
  Administered 2017-01-14: 3 mL via INTRAVENOUS

## 2017-01-12 MED ORDER — ACETAMINOPHEN 650 MG RE SUPP: 650.0000 mg | Freq: Four times a day (QID) | RECTAL | Status: DC | PRN

## 2017-01-12 MED ORDER — DULOXETINE HCL 30 MG PO CPEP
60.0000 mg | ORAL_CAPSULE | Freq: Every day | ORAL | Status: DC
  Administered 2017-01-13 – 2017-01-16 (×4): 60 mg via ORAL
  Filled 2017-01-12 (×4): qty 2

## 2017-01-12 MED ORDER — SODIUM CHLORIDE 0.9 % IV BOLUS (SEPSIS)
1000.0000 mL | Freq: Once | INTRAVENOUS | Status: AC
  Administered 2017-01-12: 1000 mL via INTRAVENOUS

## 2017-01-12 MED ORDER — ENSURE ENLIVE PO LIQD
237.0000 mL | Freq: Two times a day (BID) | ORAL | Status: DC
  Administered 2017-01-13 – 2017-01-16 (×7): 237 mL via ORAL

## 2017-01-12 MED ORDER — ASPIRIN EC 81 MG PO TBEC
81.0000 mg | DELAYED_RELEASE_TABLET | Freq: Every day | ORAL | Status: DC
  Administered 2017-01-13 – 2017-01-16 (×4): 81 mg via ORAL
  Filled 2017-01-12 (×4): qty 1

## 2017-01-12 MED ORDER — INSULIN ASPART 100 UNIT/ML ~~LOC~~ SOLN: 10.0000 [IU] | Freq: Once | SUBCUTANEOUS | Status: DC

## 2017-01-12 MED ORDER — SODIUM CHLORIDE 0.9 % IV BOLUS (SEPSIS): 1000.0000 mL | Freq: Once | INTRAVENOUS | Status: DC

## 2017-01-12 MED ORDER — POTASSIUM CHLORIDE CRYS ER 20 MEQ PO TBCR
20.0000 meq | EXTENDED_RELEASE_TABLET | Freq: Two times a day (BID) | ORAL | Status: DC
  Administered 2017-01-12 – 2017-01-13 (×2): 20 meq via ORAL
  Filled 2017-01-12 (×2): qty 1

## 2017-01-12 MED ORDER — ENOXAPARIN SODIUM 80 MG/0.8ML ~~LOC~~ SOLN
1.0000 mg/kg | Freq: Once | SUBCUTANEOUS | Status: AC
  Administered 2017-01-12: 70 mg via SUBCUTANEOUS
  Filled 2017-01-12: qty 0.8

## 2017-01-12 MED ORDER — DIPHENOXYLATE-ATROPINE 2.5-0.025 MG PO TABS: 1.0000 | ORAL_TABLET | Freq: Four times a day (QID) | ORAL | Status: DC | PRN

## 2017-01-12 MED ORDER — HEPARIN SOD (PORK) LOCK FLUSH 100 UNIT/ML IV SOLN
INTRAVENOUS | Status: DC
Start: 2017-01-12 — End: 2017-01-12
  Filled 2017-01-12: qty 5

## 2017-01-12 MED ORDER — SODIUM CHLORIDE 0.9% FLUSH
10.0000 mL | Freq: Two times a day (BID) | INTRAVENOUS | Status: DC
  Administered 2017-01-12 – 2017-01-14 (×5): 10 mL
  Administered 2017-01-15: 20 mL
  Administered 2017-01-15: 10 mL

## 2017-01-12 MED ORDER — PANTOPRAZOLE SODIUM 40 MG PO TBEC
40.0000 mg | DELAYED_RELEASE_TABLET | Freq: Two times a day (BID) | ORAL | Status: DC
  Administered 2017-01-12 – 2017-01-16 (×8): 40 mg via ORAL
  Filled 2017-01-12 (×8): qty 1

## 2017-01-12 MED ORDER — SODIUM CHLORIDE 0.9 % IV SOLN: 250.0000 mL | INTRAVENOUS | Status: DC | PRN

## 2017-01-12 MED ORDER — POLYETHYLENE GLYCOL 3350 17 G PO PACK
17.0000 g | PACK | Freq: Every day | ORAL | Status: DC
  Administered 2017-01-13 – 2017-01-16 (×4): 17 g via ORAL
  Filled 2017-01-12 (×4): qty 1

## 2017-01-12 MED ORDER — ONDANSETRON HCL 4 MG PO TABS: 4.0000 mg | ORAL_TABLET | Freq: Four times a day (QID) | ORAL | Status: DC | PRN

## 2017-01-12 MED ORDER — ANASTROZOLE 1 MG PO TABS
1.0000 mg | ORAL_TABLET | Freq: Every day | ORAL | Status: DC
Start: 2017-01-13 — End: 2017-01-16
  Administered 2017-01-13 – 2017-01-16 (×4): 1 mg via ORAL
  Filled 2017-01-12 (×4): qty 1

## 2017-01-12 MED ORDER — POLYETHYLENE GLYCOL 3350 17 G PO PACK: 17.0000 g | PACK | Freq: Every day | ORAL | Status: DC | PRN

## 2017-01-12 MED ORDER — SODIUM CHLORIDE 0.9% FLUSH
3.0000 mL | Freq: Two times a day (BID) | INTRAVENOUS | Status: DC
  Administered 2017-01-12 – 2017-01-14 (×2): 3 mL via INTRAVENOUS

## 2017-01-12 MED ORDER — IOPAMIDOL (ISOVUE-370) INJECTION 76%
75.0000 mL | Freq: Once | INTRAVENOUS | Status: AC | PRN
  Administered 2017-01-12: 75 mL via INTRAVENOUS

## 2017-01-12 MED ORDER — ONDANSETRON HCL 4 MG/2ML IJ SOLN: 4.0000 mg | Freq: Four times a day (QID) | INTRAMUSCULAR | Status: DC | PRN

## 2017-01-12 MED ORDER — SODIUM CHLORIDE 0.9% FLUSH: 10.0000 mL | INTRAVENOUS | Status: DC | PRN

## 2017-01-12 NOTE — ED Provider Notes (Signed)
[facility] Emergency Department Provider Note  ____________________________________________   First MD Initiated Contact with Patient 01/12/17 1458     (approximate)  I have reviewed the triage vital signs and the nursing notes.   HISTORY  Chief Complaint Tachycardia    HPI Sarah Horne is a 61 y.o. female who is sent to the emergency department by her home health nurse for hypotension and tachycardia. She has a complex past medical history including stage III breast cancer and is status post left-sided mastectomy most recently had chemotherapy about a week ago. She was seen in our emergency department yesterdayfor similar episode was offered inpatient admission for dehydration and fatigue however she declined and went home. When her home health nurse saw her today the patient was tachycardic and recommended she come back to the emergency department. The patient says she has not had a fever whatsoever. She currently has no pain. She feels generally rundown and somewhat short of breath. She has no chest pain. She does have some bilateral lower extremity swelling. She says she's never had a deep vein thrombus or pulmonary embolism.   Past Medical History:  Diagnosis Date  . Breast cancer ([place]) 2016   Left-radiation  . Cancer ([place]) 2016   Stage II, ER positive, PR positive, HER-2/neu overexpressing of the left breast.  . Chemotherapy-induced peripheral neuropathy ([place])   . Heart attack ([place]) 1998  . Hyperlipidemia   . Hypertension   . Neuromuscular disorder (H[facility])    neuropathy  . Neuropathy     Patient Active Problem List   Diagnosis Date Noted  . Dehydration 12/25/2016  . Blood loss anemia 12/15/2016  . Ulceration of colon   . Heartburn   . Duodenitis   . Gastritis without bleeding   . Blood in stool   . Acute esophagitis   . Acute esophagogastric ulcer   . Rectal bleed   . Hypovolemic shock ([place])   . Ischemic leg 12/04/2016  .  Chemotherapy induced neutropenia ([place]) 11/20/2016  . Palliative care by specialist   . Goals of care, counseling/discussion   . DNR (do not resuscitate) discussion   . Cerebral edema ([place]) 11/10/2016  . Encounter for monitoring cardiotoxic drug therapy 11/09/2016  . Elevated LFTs 10/20/2016  . URI with cough and congestion 08/25/2016  . Peripheral neuropathy due to chemotherapy ([place]) 03/13/2016  . Encounter for antineoplastic chemotherapy 03/13/2016  . Hyperlipidemia, unspecified 01/29/2016  . Hyperglycemia, unspecified 01/29/2016  . Cardiomyopathy ([place]) 01/13/2016  . Hypertension 07/18/2015  . Carcinoma of upper-outer quadrant of left breast in female, estrogen receptor positive ([place]) 12/27/2014    Past Surgical History:  Procedure Laterality Date  . BREAST BIOPSY Left 2016   Positive  . BREAST LUMPECTOMY WITH SENTINEL LYMPH NODE BIOPSY Left 05/23/2015   Procedure: LEFT BREAST WIDE EXCISION WITH AXILLARY DISSECTION, MASTOPLASTY ;  Surgeon: [name], MD;  Location: [facility] ORS;  Service: General;  Laterality: Left;  . BREAST SURGERY Left 12/18/14   breast biopsy/INVASIVE DUCTAL CARCINOMA OF BREAST, NOTTINGHAM GRADE 2.  Marland Kitchen BREAST SURGERY  05/23/2015.   Wide excision/mastoplasty, axillary dissection. No residual invasive cancer, positive for residual DCIS. 0/2 nodes identified on axillary dissection. (no SLN by technetium or methylene blue)  . CARDIAC CATHETERIZATION    . COLONOSCOPY WITH PROPOFOL N/A 12/10/2016   Procedure: COLONOSCOPY WITH PROPOFOL;  Surgeon: Lucilla Lame, MD;  Location: [facility] ENDOSCOPY;  Service: Endoscopy;  Laterality: N/A;  . ESOPHAGOGASTRODUODENOSCOPY (EGD) WITH PROPOFOL N/A 12/08/2016   Procedure:  ESOPHAGOGASTRODUODENOSCOPY (EGD) WITH PROPOFOL;  Surgeon: Lucilla Lame, MD;  Location: [facility] ENDOSCOPY;  Service: Endoscopy;  Laterality: N/A;  . PORTACATH PLACEMENT Right 12-31-14   Dr Bary Castilla    Prior to Admission medications   Medication Sig Start Date End Date  Taking? Authorizing Provider  anastrozole (ARIMIDEX) 1 MG tablet TAKE 1 TABLET DAILY 11/04/16  Yes Cammie Sickle, MD  aspirin 81 MG tablet Take 81 mg by mouth daily.   Yes Historical Provider, MD  diphenoxylate-atropine (LOMOTIL) 2.5-0.025 MG tablet Take 1 tablet by mouth 4 (four) times daily as needed for diarrhea or loose stools. Take it along with immodium 09/07/16  Yes Cammie Sickle, MD  DULoxetine (CYMBALTA) 60 MG capsule TAKE 1 CAPSULE (60 MG TOTAL) BY MOUTH DAILY. 12/23/16  Yes Cammie Sickle, MD  KLOR-CON M20 20 MEQ tablet Take 20 mEq by mouth 2 (two) times daily. 11/26/16  Yes Historical Provider, MD  metoprolol tartrate (LOPRESSOR) 25 MG tablet Take 0.5 tablets (12.5 mg total) by mouth 2 (two) times daily. Hold if you're Systolic Blood pressure is less than 100. 01/07/17  Yes Ryan M Dunn, PA-C  ondansetron (ZOFRAN) 8 MG tablet Take 1 tablet (8 mg total) by mouth every 8 (eight) hours as needed for nausea or vomiting (start 3 days; after chemo). 11/26/16  Yes Cammie Sickle, MD  clopidogrel (PLAVIX) 75 MG tablet Take 1 tablet (75 mg total) by mouth daily. Patient not taking: Reported on 01/12/2017 12/07/16   Fritzi Mandes, MD  feeding supplement, ENSURE ENLIVE, (ENSURE ENLIVE) LIQD Take 237 mLs by mouth 2 (two) times daily between meals. 12/07/16   Fritzi Mandes, MD  Magnesium 400 MG TABS Take 1 tablet by mouth daily. Patient not taking: Reported on 01/12/2017 01/08/17   Areta Haber Dunn, PA-C  pantoprazole (PROTONIX) 40 MG tablet Take 1 tablet (40 mg total) by mouth 2 (two) times daily. 12/11/16   Fritzi Mandes, MD  polyethylene glycol (MIRALAX / GLYCOLAX) packet Take 17 g by mouth daily. 12/07/16   Fritzi Mandes, MD    Allergies No known allergies  Family History  Problem Relation Age of Onset  . Breast cancer Maternal Aunt   . Breast cancer Cousin   . Brain cancer Maternal Uncle     Social History Social History  Substance Use Topics  . Smoking status: Light Tobacco Smoker     Packs/day: 0.50    Years: 18.00    Types: Cigarettes  . Smokeless tobacco: Never Used  . Alcohol use No    Review of Systems Constitutional: No fever/chills Eyes: No visual changes. ENT: No sore throat. Cardiovascular: Denies chest pain. Respiratory: Positive shortness of breath. Gastrointestinal: No abdominal pain.  No nausea, no vomiting.  No diarrhea.  No constipation. Genitourinary: Negative for dysuria. Musculoskeletal: Negative for back pain. Skin: Negative for rash. Neurological: Negative for headaches, focal weakness or numbness.  10-point ROS otherwise negative.  ____________________________________________   PHYSICAL EXAM:  VITAL SIGNS: ED Triage Vitals  Enc Vitals Group     BP 01/12/17 1412 115/69     Pulse Rate 01/12/17 1412 (!) 149     Resp 01/12/17 1412 14     Temp 01/12/17 1412 99 F (37.2 C)     Temp src --      SpO2 01/12/17 1412 99 %     Weight 01/12/17 1413 150 lb (68 kg)     Height 01/12/17 1413 _0  (1.676 m)     Head Circumference --  Peak Flow --      Pain Score 01/12/17 1425 0     Pain Loc --      Pain Edu? --      Excl. in [place]? --     Constitutional: Alert and oriented x 4 Appears somewhat short of breath but able to speak in full sentences Eyes: PERRL EOMI. Head: Atraumatic. Nose: No congestion/rhinnorhea. Mouth/Throat: No trismus Neck: No stridor.  No jugular venous distention and able to lie completely flat Cardiovascular: Tachycardic rate, regular rhythm. Grossly normal heart sounds.  Good peripheral circulation. Respiratory: Slightly increased respiratory effort.  No retractions. Lungs CTAB and moving good air Gastrointestinal: Soft nondistended nontender no rebound no guarding no peritonitis no McBurney's tenderness negative Rovsing's no costovertebral tenderness negative Murphy's Musculoskeletal: No lower extremity edema  legs are equal in size Neurologic:  Normal speech and language. No gross focal neurologic deficits are  appreciated. Skin:  Skin is warm, dry and intact. No rash noted. Psychiatric: Mood and affect are normal. Speech and behavior are normal.    ____________________________________________   DIFFERENTIAL  Dehydration, sepsis, acute coronary syndrome, pulmonary embolism ____________________________________________   LABS (all labs ordered are listed, but only abnormal results are displayed)  Labs Reviewed  CBC - Abnormal; Notable for the following:       Result Value   WBC 3.3 (*)    RBC 3.38 (*)    Hemoglobin 10.2 (*)    HCT 30.5 (*)    RDW 24.0 (*)    All other components within normal limits  BASIC METABOLIC PANEL - Abnormal; Notable for the following:    Potassium 2.9 (*)    Chloride 100 (*)    Glucose, Bld 151 (*)    Calcium 8.1 (*)    All other components within normal limits  TROPONIN I - Abnormal; Notable for the following:    Troponin I 0.06 (*)    All other components within normal limits  HEPATIC FUNCTION PANEL - Abnormal; Notable for the following:    Total Protein 6.1 (*)    Albumin 2.3 (*)    AST 85 (*)    Alkaline Phosphatase 217 (*)    Total Bilirubin 1.5 (*)    Indirect Bilirubin 1.1 (*)    All other components within normal limits  BRAIN NATRIURETIC PEPTIDE - Abnormal; Notable for the following:    B Natriuretic Peptide 167.0 (*)    All other components within normal limits  CULTURE, BLOOD (ROUTINE X 2)  CULTURE, BLOOD (ROUTINE X 2)  CK  TSH  LACTIC ACID, PLASMA  URINALYSIS, COMPLETE (UACMP) WITH MICROSCOPIC  LACTIC ACID, PLASMA  CBC  CREATININE, SERUM    Elevated troponin and BNP although troponin about at baseline. Normal renal function. Significantly low albumin c/w malnutrition __________________________________________  EKG ED ECG REPORT I, Darel Hong, the attending physician, personally viewed and interpreted this ECG.  Date: 01/12/2017 Rate: 145 Rhythm: normal sinus rhythm QRS Axis: normal Intervals: normal ST/T Wave  abnormalities: normal Conduction Disturbances: none Narrative Interpretation: Abnormal. There is an S wave in lead 1 which is abnormal although does not need a full rightward axis. Also has Q3 T3  ____________________________________________  RADIOLOGY  CT scan concerning for nonocclusive pulmonary embolism ____________________________________________   PROCEDURES  Procedure(s) performed: no  Procedures  Critical Care performed: yes  CRITICAL CARE Performed by: Darel Hong   Total critical care time: 32 minutes  Critical care time was exclusive of separately billable procedures and treating other patients.  Critical care  was necessary to treat or prevent imminent or life-threatening deterioration.  Critical care was time spent personally by me on the following activities: development of treatment plan with patient and/or surrogate as well as nursing, discussions with consultants, evaluation of patient's response to treatment, examination of patient, obtaining history from patient or surrogate, ordering and performing treatments and interventions, ordering and review of laboratory studies, ordering and review of radiographic studies, pulse oximetry and re-evaluation of patient's condition.   ____________________________________________   INITIAL IMPRESSION / ASSESSMENT AND PLAN / ED COURSE  Pertinent labs & imaging results that were available during my care of the patient were reviewed by me and considered in my medical decision making (see chart for details).  On arrival the patient has a normal blood pressure but is tachycardic with sinus tachycardia and is slightly tachypnea. Differential is broad but in this patient with advanced malignancy and having a S1Q3T3 and clear lungs I'm concerned that she may have a pulmonary embolism.    The patient's heart rate is normalized after IV fluids but her troponin remains elevated as well as her BNP and her CT scan shows a  nonocclusive pulmonary embolism. Given her complex medical history on not comfortable discharging her home with anticoagulation and at this point she requires inpatient admission.  ____________________________________________   FINAL CLINICAL IMPRESSION(S) / ED DIAGNOSES  Final diagnoses:  Other chronic pulmonary embolism without acute cor pulmonale (HCC)      NEW MEDICATIONS STARTED DURING THIS VISIT:  New Prescriptions   No medications on file     Note:  This document was prepared using Dragon voice recognition software and may include unintentional dictation errors.     Darel Hong, MD 01/12/17 1739

## 2017-01-12 NOTE — Telephone Encounter (Signed)
Sarah Horne OP therapist calling stating she is with patient and she is calling to give [place] a update Pt did end up going to ED  She was told she was dehydrated and they checked her blood  Today pt is going to see Dr Lucky Cowboy   BP Today was 81/68 HR 145 About 30 min later  79/68 HR 138  She states it was really hard for her to get the BP and HR  Just wanted to give [place] an update Please advise

## 2017-01-12 NOTE — Telephone Encounter (Signed)
Called and spoke to the OP Therapist, Sherlynn Stalls. She confirmed the BP readings and said the patient  reported feeling tired and weak. Patient was in the ED yesterday and treated with IVF and lab work drawn. Sherlynn Stalls was not at patient's home at this time. I will call patient at her home.

## 2017-01-12 NOTE — ED Notes (Signed)
Port de-accessed

## 2017-01-12 NOTE — ED Triage Notes (Signed)
Seen through ED yesterday for c/o hypotension.  Seen by home health nurse today and cardiologist referred patient back to ED.  Patient denies c/o chest pain or palpitations, just feeling tired and without energy.

## 2017-01-12 NOTE — ED Notes (Signed)
Right sided chest port accessed by this RN with 20G x 1.0 in needle. Patient tolerated well. Blood returned. Sterile technique maintained.

## 2017-01-12 NOTE — H&P (Signed)
[facility] NAME: Sarah Horne    MR#:  [id number]  DATE OF BIRTH:  19-Oct-1955  DATE OF ADMISSION:  01/12/2017  PRIMARY CARE PHYSICIAN: Keith Rake, MD   REQUESTING/REFERRING PHYSICIAN: Dr. Lessie Dings  CHIEF COMPLAINT:   Chief Complaint  Patient presents with  . Tachycardia    HISTORY OF PRESENT ILLNESS:  Sarah Horne  is a 61 y.o. female with a known history of Hypertension, cardiomyopathy, stage IV breast cancer with liver and brain metastases presents to the emergency room sent in by her home health care nurse due to tachycardia into the 140s. A call was made to her cardiology office who suggested coming to the emergency room. Patient was seen yesterday for weakness found to be tachycardic and discharged home after hydration. Today she had a CT scan of the chest which showed left lower lobe segmental pulmonary embolism. She has had no lower extremity swelling. No recent surgeries. She did have 2 recent hospital stays in February and March. No bleeding. No shortness of breath or palpitations. Does feel lightheaded and dizzy.  PAST MEDICAL HISTORY:   Past Medical History:  Diagnosis Date  . Breast cancer ([place]) 2016   Left-radiation  . Cancer ([place]) 2016   Stage II, ER positive, PR positive, HER-2/neu overexpressing of the left breast.  . Chemotherapy-induced peripheral neuropathy ([place])   . Heart attack ([place]) 1998  . Hyperlipidemia   . Hypertension   . Neuromuscular disorder ([facility])    neuropathy  . Neuropathy     PAST SURGICAL HISTORY:   Past Surgical History:  Procedure Laterality Date  . BREAST BIOPSY Left 2016   Positive  . BREAST LUMPECTOMY WITH SENTINEL LYMPH NODE BIOPSY Left 05/23/2015   Procedure: LEFT BREAST WIDE EXCISION WITH AXILLARY DISSECTION, MASTOPLASTY ;  Surgeon: [name], MD;  Location: [facility] ORS;  Service: General;  Laterality: Left;  . BREAST SURGERY Left 12/18/14   breast biopsy/INVASIVE DUCTAL  CARCINOMA OF BREAST, NOTTINGHAM GRADE 2.  Marland Kitchen BREAST SURGERY  05/23/2015.   Wide excision/mastoplasty, axillary dissection. No residual invasive cancer, positive for residual DCIS. 0/2 nodes identified on axillary dissection. (no SLN by technetium or methylene blue)  . CARDIAC CATHETERIZATION    . COLONOSCOPY WITH PROPOFOL N/A 12/10/2016   Procedure: COLONOSCOPY WITH PROPOFOL;  Surgeon: Lucilla Lame, MD;  Location: [facility] ENDOSCOPY;  Service: Endoscopy;  Laterality: N/A;  . ESOPHAGOGASTRODUODENOSCOPY (EGD) WITH PROPOFOL N/A 12/08/2016   Procedure: ESOPHAGOGASTRODUODENOSCOPY (EGD) WITH PROPOFOL;  Surgeon: Lucilla Lame, MD;  Location: [facility] ENDOSCOPY;  Service: Endoscopy;  Laterality: N/A;  . PORTACATH PLACEMENT Right 12-31-14   Dr Bary Castilla    SOCIAL HISTORY:   Social History  Substance Use Topics  . Smoking status: Light Tobacco Smoker    Packs/day: 0.50    Years: 18.00    Types: Cigarettes  . Smokeless tobacco: Never Used  . Alcohol use No    FAMILY HISTORY:   Family History  Problem Relation Age of Onset  . Breast cancer Maternal Aunt   . Breast cancer Cousin   . Brain cancer Maternal Uncle     DRUG ALLERGIES:   Allergies  Allergen Reactions  . No Known Allergies     REVIEW OF SYSTEMS:   Review of Systems  Constitutional: Positive for malaise/fatigue. Negative for chills, fever and weight loss.  HENT: Negative for hearing loss and nosebleeds.   Eyes: Negative for blurred vision, double vision and pain.  Respiratory: Negative for cough, hemoptysis, sputum  production, shortness of breath and wheezing.   Cardiovascular: Negative for chest pain, palpitations, orthopnea and leg swelling.  Gastrointestinal: Negative for abdominal pain, constipation, diarrhea, nausea and vomiting.  Genitourinary: Negative for dysuria and hematuria.  Musculoskeletal: Negative for back pain, falls and myalgias.  Skin: Negative for rash.  Neurological: Positive for weakness. Negative for dizziness,  tremors, sensory change, speech change, focal weakness, seizures and headaches.  Endo/Heme/Allergies: Does not bruise/bleed easily.  Psychiatric/Behavioral: Negative for depression and memory loss. The patient is not nervous/anxious.     MEDICATIONS AT HOME:   Prior to Admission medications   Medication Sig Start Date End Date Taking? Authorizing Provider  anastrozole (ARIMIDEX) 1 MG tablet TAKE 1 TABLET DAILY 11/04/16  Yes Cammie Sickle, MD  aspirin 81 MG tablet Take 81 mg by mouth daily.   Yes Historical Provider, MD  diphenoxylate-atropine (LOMOTIL) 2.5-0.025 MG tablet Take 1 tablet by mouth 4 (four) times daily as needed for diarrhea or loose stools. Take it along with immodium 09/07/16  Yes Cammie Sickle, MD  DULoxetine (CYMBALTA) 60 MG capsule TAKE 1 CAPSULE (60 MG TOTAL) BY MOUTH DAILY. 12/23/16  Yes Cammie Sickle, MD  KLOR-CON M20 20 MEQ tablet Take 20 mEq by mouth 2 (two) times daily. 11/26/16  Yes Historical Provider, MD  metoprolol tartrate (LOPRESSOR) 25 MG tablet Take 0.5 tablets (12.5 mg total) by mouth 2 (two) times daily. Hold if you're Systolic Blood pressure is less than 100. 01/07/17  Yes Ryan M Dunn, PA-C  ondansetron (ZOFRAN) 8 MG tablet Take 1 tablet (8 mg total) by mouth every 8 (eight) hours as needed for nausea or vomiting (start 3 days; after chemo). 11/26/16  Yes Cammie Sickle, MD  pantoprazole (PROTONIX) 40 MG tablet Take 1 tablet (40 mg total) by mouth 2 (two) times daily. 12/11/16  Yes Fritzi Mandes, MD  polyethylene glycol (MIRALAX / GLYCOLAX) packet Take 17 g by mouth daily. 12/07/16  Yes Fritzi Mandes, MD  clopidogrel (PLAVIX) 75 MG tablet Take 1 tablet (75 mg total) by mouth daily. Patient not taking: Reported on 01/12/2017 12/07/16   Fritzi Mandes, MD  feeding supplement, ENSURE ENLIVE, (ENSURE ENLIVE) LIQD Take 237 mLs by mouth 2 (two) times daily between meals. 12/07/16   Fritzi Mandes, MD  Magnesium 400 MG TABS Take 1 tablet by mouth daily. Patient not  taking: Reported on 01/12/2017 01/08/17   Rise Mu, PA-C     VITAL SIGNS:  Blood pressure 120/87, pulse (!) 131, temperature 99 F (37.2 C), resp. rate (!) 23, height 5' 6"  (1.676 m), weight 68 kg (150 lb), SpO2 100 %.  PHYSICAL EXAMINATION:  Physical Exam  GENERAL:  61 y.o.-year-old patient lying in the bed with no acute distress.  EYES: Pupils equal, round, reactive to light and accommodation. No scleral icterus. Extraocular muscles intact.  HEENT: Head atraumatic, normocephalic. Oropharynx and nasopharynx clear. No oropharyngeal erythema, moist oral mucosa  NECK:  Supple, no jugular venous distention. No thyroid enlargement, no tenderness.  LUNGS: Normal breath sounds bilaterally, no wheezing, rales, rhonchi. No use of accessory muscles of respiration.  CARDIOVASCULAR: S1, S2 . Tachycardia ABDOMEN: Soft, nontender, nondistended. Bowel sounds present. No organomegaly or mass.  EXTREMITIES: No pedal edema, cyanosis, or clubbing. + 2 pedal & radial pulses b/l.   NEUROLOGIC: Cranial nerves II through XII are intact. No focal Motor or sensory deficits appreciated b/l PSYCHIATRIC: The patient is alert and oriented x 3. Good affect.  SKIN: No obvious rash, lesion, or ulcer.  LABORATORY PANEL:   CBC  Recent Labs Lab 01/12/17 1514  WBC 3.3*  HGB 10.2*  HCT 30.5*  PLT 157   ------------------------------------------------------------------------------------------------------------------  Chemistries   Recent Labs Lab 01/07/17 1144  01/12/17 1514  NA  --   < > 135  K  --   < > 2.9*  CL  --   < > 100*  CO2  --   < > 27  GLUCOSE  --   < > 151*  BUN  --   < > 9  CREATININE  --   < > 0.51  CALCIUM  --   < > 8.1*  MG 1.7  --   --   AST  --   < > 85*  ALT  --   < > 27  ALKPHOS  --   < > 217*  BILITOT  --   < > 1.5*  < > = values in this interval not  displayed. ------------------------------------------------------------------------------------------------------------------  Cardiac Enzymes  Recent Labs Lab 01/12/17 1514  TROPONINI 0.06*   ------------------------------------------------------------------------------------------------------------------  RADIOLOGY:  Dg Chest 2 View  Result Date: 01/12/2017 CLINICAL DATA:  Hypotension yesterday. EXAM: CHEST  2 VIEW COMPARISON:  02/25/2015 FINDINGS: Right-sided power port remains in place with tip in the SVC 2 cm above the right atrium. Heart size is normal. Chronic aortic atherosclerosis. The lungs are clear. Pulmonary vascularity is normal. Nipple shadow on the right. Ordinary mild degenerative changes affect the spine. IMPRESSION: No active disease. Power port remains in good position. Aortic atherosclerosis. Electronically Signed   By: Nelson Chimes M.D.   On: 01/12/2017 14:53   Ct Angio Chest Pe W/cm &/or Wo Cm  Result Date: 01/12/2017 CLINICAL DATA:  61 year old female with history of hypotension presenting with chest pain. Tachycardia. Weakness. History of breast cancer. EXAM: CT ANGIOGRAPHY CHEST WITH CONTRAST TECHNIQUE: Multidetector CT imaging of the chest was performed using the standard protocol during bolus administration of intravenous contrast. Multiplanar CT image reconstructions and MIPs were obtained to evaluate the vascular anatomy. CONTRAST:  75 mL of Isovue 370. COMPARISON:  Chest CT 11/02/2016. FINDINGS: Cardiovascular: In a segmental sized branch extending to the inferior segment of the lingula there is a nonocclusive filling defect, compatible with pulmonary embolism. This is slightly eccentrically located. No larger central filling defects are noted. Heart size is normal. There is no significant pericardial fluid, thickening or pericardial calcification. Right-sided subclavian single-lumen porta cath with tip terminating in the right atrium. Mediastinum/Nodes: No  pathologically enlarged mediastinal or hilar lymph nodes. Esophagus is unremarkable in appearance. No axillary lymphadenopathy. Lungs/Pleura: The small nodular area of architectural distortion in the superior segment of the left lower lobe measuring only 5 mm (axial image 37 of series 6) is stable in appearance compared to the prior study, favored to reflect an area of post infectious or inflammatory scarring. No other suspicious appearing pulmonary nodules or masses are noted. Mild diffuse bronchial wall thickening with mild centrilobular and paraseptal emphysema. No acute consolidative airspace disease. No pleural effusions. Upper Abdomen: Multiple poorly defined hypovascular lesions are again noted throughout the liver. Although direct comparison with the prior examination is not possible given significant differences in bolus timing, the size of these lesions appears generally decreased compared to the prior study, suggesting a positive response to therapy. Musculoskeletal: There are no aggressive appearing lytic or blastic lesions noted in the visualized portions of the skeleton. Review of the MIP images confirms the above findings. IMPRESSION: 1. Study is positive for a small  nonocclusive pulmonary embolism to the inferior segment of the lingula. No evidence to suggest associated right-sided heart strain. Given that this filling defect is somewhat eccentric, this is favored to represent chronic thromboembolism rather than an acute embolic event. 2. Widespread metastatic disease to the liver again noted, with general decrease in size of the numerous hepatic lesions compared to the prior study, indicative of a positive response to therapy. 3. Tiny 5 mm nodule in the superior segment of the left lower lobe is unchanged, and nonspecific, but favored to represent a tiny focus of post infectious or inflammatory scarring. Attention at time of routine followup studies is recommended to ensure continued stability. 4.  Aortic atherosclerosis. Electronically Signed   By: Vinnie Langton M.D.   On: 01/12/2017 17:07     IMPRESSION AND PLAN:   * Left lower segmental PE with significant tachycardia. Patient has no shortness of breath. Tachycardia in the 130s to 140s. She was also started recently on metoprolol for the same by her cardiologist. Start patient on Lovenox and admitted on telemetry for overnight monitoring. Continue the metoprolol that she takes at home. Check echocardiogram. Consult hematology oncology Check lower extremity venous Dopplers  * Mild elevation troponin is stable. This was also checked yesterday. Echocardiogram ordered. Unlikely MI. Likely due to pulmonary embolism.  * Breast cancer, stage IV. Patient is on chemotherapy and sees Dr. Rogue Bussing.  All the records are reviewed and case discussed with ED provider. Management plans discussed with the patient, family and they are in agreement.  CODE STATUS: FULL CODE  TOTAL TIME TAKING CARE OF THIS PATIENT: 45 minutes.   Hillary Bow R M.D on 01/12/2017 at 6:24 PM  Between 7am to 6pm - Pager - [phone]  After 6pm go to [email or web address] - password [email or web address] [facility]  Office  [phone]  CC: Primary care physician; Keith Rake, MD  Note: This dictation was prepared with Dragon dictation along with smaller phrase technology. Any transcriptional errors that result from this process are unintentional.

## 2017-01-12 NOTE — ED Provider Notes (Signed)
-----------------------------------------   12:18 AM on 01/12/2017 -----------------------------------------  Asked by Dr. Joni Fears to follow-up on the third troponin which remains 0.05. Patient is feeling better; BP 109/70. Has an upcoming appointment with her oncologist on 4/20. I have asked her to call the clinic in the morning for closer follow-up. Strict return precautions given. Patient verbalizes understanding and agrees with plan of care.   Paulette Blanch, MD 01/12/17 [phone]

## 2017-01-12 NOTE — Telephone Encounter (Signed)
Called patient. She reports feeling very tired and weak. She denies dizziness, SOB, palpitations, nausea, vomiting or sweating. I asked her to get a BP while on the phone, she tried but her BP cuff would not give her a reading.  She tried several times and it read the message "error."  This was the same cuff she used earlier. Patient supposed to have an appt with Dr Lucky Cowboy at 1:30 today. Her lab work from yesterday, 01/11/17, showed some elevated troponin and potassium 3.3. Advised patient that with BP and HR recorded at this level she should call 911 and go to the ER. She verbalized understanding and will do this.

## 2017-01-13 ENCOUNTER — Encounter: Payer: Self-pay | Admitting: Nurse Practitioner

## 2017-01-13 ENCOUNTER — Observation Stay (HOSPITAL_BASED_OUTPATIENT_CLINIC_OR_DEPARTMENT_OTHER)
Admit: 2017-01-13 | Discharge: 2017-01-13 | Disposition: A | Discharge status: Home / Self Care | Payer: BLUE CROSS/BLUE SHIELD | Attending: Internal Medicine | Admitting: Internal Medicine

## 2017-01-13 ENCOUNTER — Observation Stay: Payer: BLUE CROSS/BLUE SHIELD

## 2017-01-13 DIAGNOSIS — C50919 Malignant neoplasm of unspecified site of unspecified female breast: Secondary | ICD-10-CM

## 2017-01-13 DIAGNOSIS — I2699 Other pulmonary embolism without acute cor pulmonale: Principal | ICD-10-CM

## 2017-01-13 DIAGNOSIS — I428 Other cardiomyopathies: Secondary | ICD-10-CM

## 2017-01-13 DIAGNOSIS — Z170 Estrogen receptor positive status [ER+]: Secondary | ICD-10-CM | POA: Diagnosis not present

## 2017-01-13 DIAGNOSIS — I252 Old myocardial infarction: Secondary | ICD-10-CM

## 2017-01-13 DIAGNOSIS — Z7982 Long term (current) use of aspirin: Secondary | ICD-10-CM

## 2017-01-13 DIAGNOSIS — R000 Tachycardia, unspecified: Secondary | ICD-10-CM | POA: Diagnosis not present

## 2017-01-13 DIAGNOSIS — F17210 Nicotine dependence, cigarettes, uncomplicated: Secondary | ICD-10-CM | POA: Diagnosis not present

## 2017-01-13 DIAGNOSIS — I10 Essential (primary) hypertension: Secondary | ICD-10-CM

## 2017-01-13 DIAGNOSIS — C7931 Secondary malignant neoplasm of brain: Secondary | ICD-10-CM

## 2017-01-13 DIAGNOSIS — Z79899 Other long term (current) drug therapy: Secondary | ICD-10-CM

## 2017-01-13 DIAGNOSIS — Z7901 Long term (current) use of anticoagulants: Secondary | ICD-10-CM

## 2017-01-13 DIAGNOSIS — M7989 Other specified soft tissue disorders: Secondary | ICD-10-CM | POA: Diagnosis not present

## 2017-01-13 DIAGNOSIS — G629 Polyneuropathy, unspecified: Secondary | ICD-10-CM

## 2017-01-13 DIAGNOSIS — C50412 Malignant neoplasm of upper-outer quadrant of left female breast: Secondary | ICD-10-CM | POA: Diagnosis not present

## 2017-01-13 DIAGNOSIS — I471 Supraventricular tachycardia: Secondary | ICD-10-CM

## 2017-01-13 DIAGNOSIS — I5031 Acute diastolic (congestive) heart failure: Secondary | ICD-10-CM | POA: Diagnosis not present

## 2017-01-13 DIAGNOSIS — I2782 Chronic pulmonary embolism: Secondary | ICD-10-CM | POA: Diagnosis not present

## 2017-01-13 DIAGNOSIS — Z8719 Personal history of other diseases of the digestive system: Secondary | ICD-10-CM

## 2017-01-13 DIAGNOSIS — E860 Dehydration: Secondary | ICD-10-CM | POA: Diagnosis not present

## 2017-01-13 DIAGNOSIS — E785 Hyperlipidemia, unspecified: Secondary | ICD-10-CM | POA: Diagnosis not present

## 2017-01-13 DIAGNOSIS — E876 Hypokalemia: Secondary | ICD-10-CM | POA: Diagnosis not present

## 2017-01-13 DIAGNOSIS — R2243 Localized swelling, mass and lump, lower limb, bilateral: Secondary | ICD-10-CM | POA: Diagnosis not present

## 2017-01-13 LAB — BASIC METABOLIC PANEL
ANION GAP: 7 (ref 5–15)
BUN: 7 mg/dL (ref 6–20)
CALCIUM: 7.5 mg/dL — AB (ref 8.9–10.3)
CO2: 26 mmol/L (ref 22–32)
Chloride: 101 mmol/L (ref 101–111)
Creatinine, Ser: 0.43 mg/dL — ABNORMAL LOW (ref 0.44–1.00)
GFR calc Af Amer: >60 mL/min (ref >60)
GFR calc non Af Amer: >60 mL/min (ref >60)
GLUCOSE: 97 mg/dL (ref 65–99)
Potassium: 2.8 mmol/L — ABNORMAL LOW (ref 3.5–5.1)
Sodium: 134 mmol/L — ABNORMAL LOW (ref 135–145)

## 2017-01-13 LAB — ECHOCARDIOGRAM COMPLETE
HEIGHTINCHES: 66 in
Weight: 2180.8 oz

## 2017-01-13 LAB — MAGNESIUM: Magnesium: 1.5 mg/dL — ABNORMAL LOW (ref 1.7–2.4)

## 2017-01-13 MED ORDER — POTASSIUM CHLORIDE CRYS ER 20 MEQ PO TBCR
40.0000 meq | EXTENDED_RELEASE_TABLET | Freq: Once | ORAL | Status: AC
  Administered 2017-01-13: 40 meq via ORAL
  Filled 2017-01-13: qty 2

## 2017-01-13 MED ORDER — METOPROLOL TARTRATE 5 MG/5ML IV SOLN
INTRAVENOUS | Status: AC
  Administered 2017-01-13: 5 mg via INTRAVENOUS
  Filled 2017-01-13: qty 5

## 2017-01-13 MED ORDER — METOPROLOL TARTRATE 5 MG/5ML IV SOLN
5.0000 mg | INTRAVENOUS | Status: DC | PRN
  Administered 2017-01-13: 5 mg via INTRAVENOUS

## 2017-01-13 MED ORDER — POTASSIUM CHLORIDE CRYS ER 20 MEQ PO TBCR
40.0000 meq | EXTENDED_RELEASE_TABLET | Freq: Two times a day (BID) | ORAL | Status: DC
  Administered 2017-01-13 – 2017-01-16 (×6): 40 meq via ORAL
  Filled 2017-01-13 (×6): qty 2

## 2017-01-13 MED ORDER — DILTIAZEM HCL 25 MG/5ML IV SOLN: 10.0000 mg | Freq: Once | INTRAVENOUS | Status: DC

## 2017-01-13 MED ORDER — MAGNESIUM SULFATE 4 GM/100ML IV SOLN
4.0000 g | Freq: Once | INTRAVENOUS | Status: AC
Start: 2017-01-13 — End: 2017-01-13
  Administered 2017-01-13: 4 g via INTRAVENOUS
  Filled 2017-01-13: qty 100

## 2017-01-13 NOTE — Progress Notes (Signed)
Notified MD Pyreddy of AM potassium 2.8; order for one time dose of 40 mEq PO potassium given. Nursing staff will continue to monitor for any changes in patient status. Earleen Reaper, RN

## 2017-01-13 NOTE — Progress Notes (Signed)
Patient's HR sustaining in the 150s. Notified MD Pyreddy and order given for Q4h PRN metoprolol '5mg'$  IV for HR >120. Patient is asymptomatic and unaware of her increased HR. First dose given. Nursing staff will continue to monitor for any changes in patient status. Earleen Reaper, RN

## 2017-01-13 NOTE — Consult Note (Signed)
Cardiology Consult    Patient ID: DONALD JACQUE MRN: [id number], DOB/AGE: 1956-02-12   Admit date: 01/12/2017 Date of Consult: 01/13/2017  Primary Physician: Keith Rake, MD Primary Cardiologist: Jerilynn Mages. Fletcher Anon, MD  Requesting Provider: Doylene Canning, MD  Patient Profile    Sarah Horne is a 61 y.o. female with a history of recurrent metastatic breast cancer with liver and brain mets, herceptin induced cardiomyopathy, HTN, HL, sinus tachycardia, psvt, GIB, PUD, gastritis, and ? PAD, who is being seen today for the evaluation of SVT and sinus tachycardia at the request of Dr. Anselm Jungling.  Past Medical History   Past Medical History:  Diagnosis Date  . Chemotherapy-induced peripheral neuropathy ([place])   . Epistaxis    a. 11/2016 in setting of asa/plavix-->silver nitrate cauterization.  . GI bleed    a. 11/2016 Admission w/ GIB and hypovolemic shock req 3u PRBC's;  b. 11/2016 ECG: gastritis & nonbleeding peptic ulcer; c. 11/2016 Conlonoscopy: rectal and sigmoid colonic ulcers.  . Heart attack ([place])    a. 1998 Cath @ [facility]: reportedly no intervention required.  Marland Kitchen Herceptin-induced cardiomyopathy ([place])    a. In the setting of Herceptin Rx for breast cancer (initiated 12/2014); b. 03/2015 MUGA EF 64%; b. 08/2015 MUGA: EF 51%; c. 10/2015 MUGA: EF 44%; d. 11/2015 Echo: EF 45-50%; e. 01/2016 MUGA: EF 60%; f. 06/2016 MUGA EF 65%; g. 10/2016 MUGA: EF 61%;  h. 12/2016 Echo: EF 55-60%, gr1 DD.  Marland Kitchen Hyperlipidemia   . Hypertension   . Neuropathy   . Possible PAD (peripheral artery disease) ([place])    a. 11/2016 LE cyanosis and weak pulses-->CTA w/o significant Ao-BiFem dzs. ? distal dzs-->ASA/Plavix initiated by vascular surgery.  Marland Kitchen PSVT (paroxysmal supraventricular tachycardia) ([place])    a. Dx 11/2016.  . Pulmonary embolism ([place])    a. 12/2016 CTA Chest: small nonocclusive PE in inferior segment of the lingula, somewhat eccentric filling defect suggesting chronic rather than acute embolic event; b. 0/2111 LE U/S:  No  DVT; c. 12/2016 Echo: Nl RV fxn, nl PASP.  Marland Kitchen Recurrent Metastatic breast cancer ([place])    a. Dx 2016: Stage II, ER positive, PR positive, HER-2/neu overexpressing of the left breast-->chemo/radiation; b. 10/2016 CT Abd/pelvis: diffuse liver mets, ill defined sclerotic bone lesions-T12;  c. 10/2016 MRI brain: metastatic lesion along L temporal lobe (19x33m) w/ extensive surrounding edema & 562mmidline shift to right.  . Sinus tachycardia     Past Surgical History:  Procedure Laterality Date  . BREAST BIOPSY Left 2016   Positive  . BREAST LUMPECTOMY WITH SENTINEL LYMPH NODE BIOPSY Left 05/23/2015   Procedure: LEFT BREAST WIDE EXCISION WITH AXILLARY DISSECTION, MASTOPLASTY ;  Surgeon: JeRobert BellowMD;  Location: [facility] ORS;  Service: General;  Laterality: Left;  . BREAST SURGERY Left 12/18/14   breast biopsy/INVASIVE DUCTAL CARCINOMA OF BREAST, NOTTINGHAM GRADE 2.  . Marland KitchenREAST SURGERY  05/23/2015.   Wide excision/mastoplasty, axillary dissection. No residual invasive cancer, positive for residual DCIS. 0/2 nodes identified on axillary dissection. (no SLN by technetium or methylene blue)  . CARDIAC CATHETERIZATION    . COLONOSCOPY WITH PROPOFOL N/A 12/10/2016   Procedure: COLONOSCOPY WITH PROPOFOL;  Surgeon: DaLucilla LameMD;  Location: [facility] ENDOSCOPY;  Service: Endoscopy;  Laterality: N/A;  . ESOPHAGOGASTRODUODENOSCOPY (EGD) WITH PROPOFOL N/A 12/08/2016   Procedure: ESOPHAGOGASTRODUODENOSCOPY (EGD) WITH PROPOFOL;  Surgeon: DaLucilla LameMD;  Location: [facility] ENDOSCOPY;  Service: Endoscopy;  Laterality: N/A;  . PORTACATH PLACEMENT Right 12-31-14   Dr ByBary Castilla  Allergies  Allergies  Allergen Reactions  . No Known Allergies     History of Present Illness    61 y/o ? with the above complex PMH.  In brief, she has a h/o recurrent metastatic breast cancer s/p prior radiation and chemo complicated by herceptin-induced cardiomyopathy, with LVEF dropping to a nadir of 44% in 10/2015, with subsequent  normalization of LV off of herceptin.  More recently, she was found to have brain and liver mets in 10/2016 with initiation of palliative radiation followed by chemo (Eribulin).  In March 2018, she was admitted with GIB and also cyanotic bilat lower ext.  She required 3u PRBC's and GI eval revealed gastritis with a nonbleeding peptic ulcer, along with rectal and sigmoid colonic ulcers.  She was also seen by vascular surgery with CTA of the abd/pelvis not showing any significant dzs, though there was question of distal small vessel dzs.  She was managed conservatively and after approval from GI, she was placed on asa/plavix.  She was later seen in the ED for epistaxis that required cauterization.  While being seen @ the cancer center on 3/31, she was noted to be tachycardic and hypotensive. She was referred to the ED where she was found to be in SVT.  She converted spontaneously to sinus rhythm/sinus tach.  Since her last cardiology office visit, she has been on lopressor 12.5 bid.  She notes poor PO intake - simply no appetite - also drinking little.  In that setting, she was found to be tachycardic by [facility] on 4/16 and was referred to the ED.  She was hypotensive in the 80's.  ECG showed sinus tach.  Trops were minimally elevated.  She was given IVF with improvement in vitals (she was asymptomatic throughout) and d/c'd home.  Unfortunately, on 4/17, she was again found to be hypotensive and tachycardic and she was referred back to the ED.  A CTA of the chest was performed and showed a small non-occlusive filling defect in the inferior segment of the L lingula - possibly chronic.  She was admitted and placed on heparin.  Overnight, she has had two episodes of asymptomatic SVT with rates to the 150's.  During periods of SVT, she becomes hypotensive, but again, was asymptomatic.  We've been asked to eval.  She is currently feeling well.  Echo this am showed nl LVEF and nl RV fxn w/ nl PASP.  LE u/s was neg for  DVT.  Inpatient Medications    . anastrozole  1 mg Oral Daily  . aspirin EC  81 mg Oral Daily  . diltiazem  10 mg Intravenous Once  . DULoxetine  60 mg Oral Daily  . enoxaparin (LOVENOX) injection  1 mg/kg Subcutaneous Q12H  . feeding supplement (ENSURE ENLIVE)  237 mL Oral BID BM  . metoprolol tartrate  12.5 mg Oral BID  . pantoprazole  40 mg Oral BID  . polyethylene glycol  17 g Oral Daily  . potassium chloride SA  40 mEq Oral BID  . sodium chloride flush  10-40 mL Intracatheter Q12H  . sodium chloride flush  3 mL Intravenous Q12H  . sodium chloride flush  3 mL Intravenous Q12H    Family History    Family History  Problem Relation Age of Onset  . Breast cancer Maternal Aunt   . Breast cancer Cousin   . Brain cancer Maternal Uncle     Social History    Social History   Social History  . Marital  status: Divorced    Spouse name: N/A  . Number of children: N/A  . Years of education: N/A   Occupational History  . Not on file.   Social History Main Topics  . Smoking status: Light Tobacco Smoker    Packs/day: 0.50    Years: 18.00    Types: Cigarettes  . Smokeless tobacco: Never Used  . Alcohol use No  . Drug use: No  . Sexual activity: Not on file   Other Topics Concern  . Not on file   Social History Narrative   Lives in [place] by herself.     Review of Systems    General:  She has been feeling reasonably well.  No appetite however. No chills, fever, night sweats or weight changes.  Cardiovascular:  No chest pain, dyspnea on exertion, edema, orthopnea, palpitations, paroxysmal nocturnal dyspnea. Dermatological: No rash, lesions/masses Respiratory: No cough, dyspnea Urologic: No hematuria, dysuria Abdominal:   No nausea, vomiting, diarrhea, bright red blood per rectum, melena, or hematemesis Neurologic:  No visual changes, wkns, changes in mental status. All other systems reviewed and are otherwise negative except as noted above.  Physical Exam     Blood pressure 105/62, pulse 90, temperature 98.8 F (37.1 C), temperature source Oral, resp. rate 18, height 5' 6"  (1.676 m), weight 136 lb 4.8 oz (61.8 kg), SpO2 97 %.  General: Pleasant, NAD Psych: Normal affect. Neuro: Alert and oriented X 3. Moves all extremities spontaneously. HEENT: Normal  Neck: Supple without bruits or JVD. Lungs:  Resp regular and unlabored, diminished breath sounds R base, otw CTA. Heart: RRR no s3, s4, or murmurs. Abdomen: Soft, non-tender, non-distended, BS + x 4.  Extremities: No clubbing, cyanosis or edema. DP/PT/Radials 2+ and equal bilaterally.  Labs     Recent Labs  01/11/17 1628 01/11/17 1958 01/11/17 2253 01/12/17 1514  CKTOTAL  --   --   --  69  TROPONINI 0.04* 0.05* 0.05* 0.06*   Lab Results  Component Value Date   WBC 3.3 (L) 01/12/2017   HGB 10.2 (L) 01/12/2017   HCT 30.5 (L) 01/12/2017   MCV 90.3 01/12/2017   PLT 157 01/12/2017     Recent Labs Lab 01/12/17 1514 01/13/17 0324  NA 135 134*  K 2.9* 2.8*  CL 100* 101  CO2 27 26  BUN 9 7  CREATININE 0.51 0.43*  CALCIUM 8.1* 7.5*  PROT 6.1*  --   BILITOT 1.5*  --   ALKPHOS 217*  --   ALT 27  --   AST 85*  --   GLUCOSE 151* 97   Lab Results  Component Value Date   CHOL 130 01/29/2016   HDL 42 01/29/2016   LDLCALC 74 01/29/2016   TRIG 72 01/29/2016    Radiology Studies    Dg Chest 2 View  Result Date: 01/12/2017 CLINICAL DATA:  Hypotension yesterday. EXAM: CHEST  2 VIEW COMPARISON:  02/25/2015 FINDINGS: Right-sided power port remains in place with tip in the SVC 2 cm above the right atrium. Heart size is normal. Chronic aortic atherosclerosis. The lungs are clear. Pulmonary vascularity is normal. Nipple shadow on the right. Ordinary mild degenerative changes affect the spine. IMPRESSION: No active disease. Power port remains in good position. Aortic atherosclerosis. Electronically Signed   By: Nelson Chimes M.D.   On: 01/12/2017 14:53   Ct Angio Chest Pe W/cm &/or Wo  Cm  Result Date: 01/12/2017 CLINICAL DATA:  61 year old female with history of hypotension presenting with chest pain.  Tachycardia. Weakness. History of breast cancer. EXAM: CT ANGIOGRAPHY CHEST WITH CONTRAST TECHNIQUE: Multidetector CT imaging of the chest was performed using the standard protocol during bolus administration of intravenous contrast. Multiplanar CT image reconstructions and MIPs were obtained to evaluate the vascular anatomy. CONTRAST:  75 mL of Isovue 370. COMPARISON:  Chest CT 11/02/2016. FINDINGS: Cardiovascular: In a segmental sized branch extending to the inferior segment of the lingula there is a nonocclusive filling defect, compatible with pulmonary embolism. This is slightly eccentrically located. No larger central filling defects are noted. Heart size is normal. There is no significant pericardial fluid, thickening or pericardial calcification. Right-sided subclavian single-lumen porta cath with tip terminating in the right atrium. Mediastinum/Nodes: No pathologically enlarged mediastinal or hilar lymph nodes. Esophagus is unremarkable in appearance. No axillary lymphadenopathy. Lungs/Pleura: The small nodular area of architectural distortion in the superior segment of the left lower lobe measuring only 5 mm (axial image 37 of series 6) is stable in appearance compared to the prior study, favored to reflect an area of post infectious or inflammatory scarring. No other suspicious appearing pulmonary nodules or masses are noted. Mild diffuse bronchial wall thickening with mild centrilobular and paraseptal emphysema. No acute consolidative airspace disease. No pleural effusions. Upper Abdomen: Multiple poorly defined hypovascular lesions are again noted throughout the liver. Although direct comparison with the prior examination is not possible given significant differences in bolus timing, the size of these lesions appears generally decreased compared to the prior study, suggesting a  positive response to therapy. Musculoskeletal: There are no aggressive appearing lytic or blastic lesions noted in the visualized portions of the skeleton. Review of the MIP images confirms the above findings. IMPRESSION: 1. Study is positive for a small nonocclusive pulmonary embolism to the inferior segment of the lingula. No evidence to suggest associated right-sided heart strain. Given that this filling defect is somewhat eccentric, this is favored to represent chronic thromboembolism rather than an acute embolic event. 2. Widespread metastatic disease to the liver again noted, with general decrease in size of the numerous hepatic lesions compared to the prior study, indicative of a positive response to therapy. 3. Tiny 5 mm nodule in the superior segment of the left lower lobe is unchanged, and nonspecific, but favored to represent a tiny focus of post infectious or inflammatory scarring. Attention at time of routine followup studies is recommended to ensure continued stability. 4. Aortic atherosclerosis. Electronically Signed   By: Vinnie Langton M.D.   On: 01/12/2017 17:07   US Venous Img Lower Bilateral  Result Date: 01/13/2017 CLINICAL DATA:  Pulmonary emboli. Bilateral lower extremity pain and swelling for several weeks. History of breast cancer. EXAM: BILATERAL LOWER EXTREMITY VENOUS DOPPLER ULTRASOUND TECHNIQUE: Gray-scale sonography with graded compression, as well as color Doppler and duplex ultrasound were performed to evaluate the lower extremity deep venous systems from the level of the common femoral vein and including the common femoral, femoral, profunda femoral, popliteal and calf veins including the posterior tibial, peroneal and gastrocnemius veins when visible. The superficial great saphenous vein was also interrogated. Spectral Doppler was utilized to evaluate flow at rest and with distal augmentation maneuvers in the common femoral, femoral and popliteal veins. COMPARISON:  None.  FINDINGS: RIGHT LOWER EXTREMITY Common Femoral Vein: No evidence of thrombus. Normal compressibility, respiratory phasicity and response to augmentation. Saphenofemoral Junction: No evidence of thrombus. Normal compressibility and flow on color Doppler imaging. Profunda Femoral Vein: No evidence of thrombus. Normal compressibility and flow on color Doppler imaging. Femoral  Vein: No evidence of thrombus. Normal compressibility, respiratory phasicity and response to augmentation. Popliteal Vein: No evidence of thrombus. Normal compressibility, respiratory phasicity and response to augmentation. Calf Veins: No evidence of thrombus. Normal compressibility and flow on color Doppler imaging. Superficial Great Saphenous Vein: No evidence of thrombus. Normal compressibility and flow on color Doppler imaging. Venous Reflux:  None. Other Findings:  None. LEFT LOWER EXTREMITY Common Femoral Vein: No evidence of thrombus. Normal compressibility, respiratory phasicity and response to augmentation. Saphenofemoral Junction: No evidence of thrombus. Normal compressibility and flow on color Doppler imaging. Profunda Femoral Vein: No evidence of thrombus. Normal compressibility and flow on color Doppler imaging. Femoral Vein: No evidence of thrombus. Normal compressibility, respiratory phasicity and response to augmentation. Popliteal Vein: No evidence of thrombus. Normal compressibility, respiratory phasicity and response to augmentation. Calf Veins: No evidence of thrombus. Normal compressibility and flow on color Doppler imaging. Superficial Great Saphenous Vein: No evidence of thrombus. Normal compressibility and flow on color Doppler imaging. Venous Reflux:  None. Other Findings:  None. IMPRESSION: No evidence of deep venous thrombosis. Electronically Signed   By: Logan Bores M.D.   On: 01/13/2017 10:20    ECG & Cardiac Imaging    Sinus tachycardia, 145, leftward axis, poor r progression.  Assessment & Plan    1.  SVT:   Pt with recurrent SVT in the setting of admission for hypotension, sinus tachycardia, hypokalemia, and finding of L sided PE.  She had a brief episode yesterday evening, followed by a more prolonged episode early this am.  She was asymptomatic on both occasions, though she was noted to be hypotensive.  She has been on metoprolol 12.5 bid as an outpt, which may be exacerbating her baseline hypotension. As SVT results in hypotension, we will need to consider more advanced therapy including antiarrhythmics vs catheter ablation.  Because of ongoing chemo and potential risk of QT prolongation with the combination of antiarrhythmic therapy and Eribulin, we will defer for now and instead ask EP (Dr. Caryl Comes) to evaluate in the AM to determine best course of action.  Cont low dose  blocker as tolerated.  2.  Left Pulmonary Embolus:  Noted on CTA this admission.  Suspicion for chronic PE.  Difficult situation with regards to anticoagulation in the setting of recent 3 unit GIB in 11/2016, epistaxis, and ongoing chemotherapy.  Given non-occlusive nature of PE, nl RV fxn, and absence of LE DVT, may wish to defer [facility] @ this time and consider role of IVC filter or conservative mgmt.  Defer to heme/onc.  3.  Sinus tachycardia:  In setting of poor appetite/dehydration, PE, and hypotension.  4.  Elevated troponin:  Non-specific in setting of above.  No significant R heart strain on echo.  Nl LVEF by echo.  Given comorbidities, would not pursue further ischemic eval.  5.  Hypokalemia: supplementation ordered.  6.  Normocytic anemia:  H/H stable.  7.  Recent GIB:  H/H stable.  8.  Herceptin-Induced Cardiomyopathy:  EF nl by echo earlier this am.  9.  LFT abnormalities:  In setting of liver mets.  Will limit antiarrhythmic options.  10.  ? PAD:  Cold lower ext on admission in March.  No convincing evidence of PAD - ? Small vessel dzs.  Has been on asa/plavix. If she requires [place] for PE, would d/c asa/plavix.  11.   Recurrent metastatic breast cancer/brain & liver mets:  Per heme/onc.  Signed, Murray Hodgkins, NP 01/13/2017, 12:30 PM

## 2017-01-13 NOTE — Care Management (Addendum)
patient sent to the ED by home health nurse and cardiology due to shortness of breath and tachycardia with rate 130 - 140. Currently undergoing chemo for metastatic breast cancer. Followed by [facility].  Reached out to agency to identify specific services.  she is full code. Diagnoses with pulmonary embolus and currently on Lovenox

## 2017-01-13 NOTE — Progress Notes (Signed)
Notified MD Pyreddy of no change in patient HR after IV metoprolol given. One time dose of '10mg'$  IV cardizem ordered; HR spontaneously decreased to 97, medication held. Nursing staff will continue to monitor for any changes in patient status. Earleen Reaper, RN

## 2017-01-13 NOTE — Consult Note (Signed)
[facility] CONSULT NOTE  Patient Care Team: [name], MD as PCP - General (Family Medicine) [name], MD (General Surgery) [name], MD (Inactive) as Attending Physician (Internal Medicine)  CHIEF COMPLAINTS/PURPOSE OF CONSULTATION:  Metastatic breast cancer with PE  HISTORY OF PRESENTING ILLNESS:  Sarah Horne 61 y.o.  female history of breast cancer ER/PR positive HER-2/neu negative- recurrent metastatic to liver currently on Eribulin- last received approximately 5 days ago is currently admitted to the hospital for tachycardia/dizziness. The emergency room CTA showed left lingular PE patient is on Lovenox.   Patient was recently admitted to hospital approximately a month ago- 4 bilateral lower extremity vascular insufficiency; which was initially treated with aspirin/IV heparin when patient developed GI bleed needing urgent colonoscopy and clipping of a bleeding lesion. Patient has not had any bleeding since.  Patient currently denies any nausea vomiting or diarrhea. She does admit to like swelling. She is not currently on steroids. Currently denies any blood in stools.  ROS: A complete 10 pont review of system is done which is negative except mentioned above in history of present illness  MEDICAL HISTORY:  Past Medical History:  Diagnosis Date  . Chemotherapy-induced peripheral neuropathy ([place])   . Epistaxis    a. 11/2016 in setting of asa/plavix-->silver nitrate cauterization.  . GI bleed    a. 11/2016 Admission w/ GIB and hypovolemic shock req 3u PRBC's;  b. 11/2016 ECG: gastritis & nonbleeding peptic ulcer; c. 11/2016 Conlonoscopy: rectal and sigmoid colonic ulcers.  . Heart attack ([place])    a. 1998 Cath @ [facility]: reportedly no intervention required.  Marland Kitchen Herceptin-induced cardiomyopathy ([place])    a. In the setting of Herceptin Rx for breast cancer (initiated 12/2014); b. 03/2015 MUGA EF 64%; b. 08/2015 MUGA: EF 51%; c. 10/2015 MUGA: EF 44%; d. 11/2015  Echo: EF 45-50%; e. 01/2016 MUGA: EF 60%; f. 06/2016 MUGA EF 65%; g. 10/2016 MUGA: EF 61%;  h. 12/2016 Echo: EF 55-60%, gr1 DD.  Marland Kitchen Hyperlipidemia   . Hypertension   . Neuropathy   . Possible PAD (peripheral artery disease) ([place])    a. 11/2016 LE cyanosis and weak pulses-->CTA w/o significant Ao-BiFem dzs. ? distal dzs-->ASA/Plavix initiated by vascular surgery.  Marland Kitchen PSVT (paroxysmal supraventricular tachycardia) ([place])    a. Dx 11/2016.  . Pulmonary embolism ([place])    a. 12/2016 CTA Chest: small nonocclusive PE in inferior segment of the Left lingula, somewhat eccentric filling defect suggesting chronic rather than acute embolic event; b. 04/4165 LE U/S:  No DVT; c. 12/2016 Echo: Nl RV fxn, nl PASP.  Marland Kitchen Recurrent Metastatic breast cancer ([place])    a. Dx 2016: Stage II, ER positive, PR positive, HER-2/neu overexpressing of the left breast-->chemo/radiation; b. 10/2016 CT Abd/pelvis: diffuse liver mets, ill defined sclerotic bone lesions-T12;  c. 10/2016 MRI brain: metastatic lesion along L temporal lobe (19x89m) w/ extensive surrounding edema & 582mmidline shift to right.  . Sinus tachycardia     SURGICAL HISTORY: Past Surgical History:  Procedure Laterality Date  . BREAST BIOPSY Left 2016   Positive  . BREAST LUMPECTOMY WITH SENTINEL LYMPH NODE BIOPSY Left 05/23/2015   Procedure: LEFT BREAST WIDE EXCISION WITH AXILLARY DISSECTION, MASTOPLASTY ;  Surgeon: JeRobert BellowMD;  Location: [facility] ORS;  Service: General;  Laterality: Left;  . BREAST SURGERY Left 12/18/14   breast biopsy/INVASIVE DUCTAL CARCINOMA OF BREAST, NOTTINGHAM GRADE 2.  . Marland KitchenREAST SURGERY  05/23/2015.   Wide excision/mastoplasty, axillary dissection. No residual  invasive cancer, positive for residual DCIS. 0/2 nodes identified on axillary dissection. (no SLN by technetium or methylene blue)  . CARDIAC CATHETERIZATION    . COLONOSCOPY WITH PROPOFOL N/A 12/10/2016   Procedure: COLONOSCOPY WITH PROPOFOL;  Surgeon: Lucilla Lame, MD;  Location:  [facility] ENDOSCOPY;  Service: Endoscopy;  Laterality: N/A;  . ESOPHAGOGASTRODUODENOSCOPY (EGD) WITH PROPOFOL N/A 12/08/2016   Procedure: ESOPHAGOGASTRODUODENOSCOPY (EGD) WITH PROPOFOL;  Surgeon: Lucilla Lame, MD;  Location: [facility] ENDOSCOPY;  Service: Endoscopy;  Laterality: N/A;  . PORTACATH PLACEMENT Right 12-31-14   Dr Bary Castilla    SOCIAL HISTORY: Social History   Social History  . Marital status: Divorced    Spouse name: N/A  . Number of children: N/A  . Years of education: N/A   Occupational History  . Not on file.   Social History Main Topics  . Smoking status: Light Tobacco Smoker    Packs/day: 0.50    Years: 18.00    Types: Cigarettes  . Smokeless tobacco: Never Used  . Alcohol use No  . Drug use: No  . Sexual activity: Not on file   Other Topics Concern  . Not on file   Social History Narrative   Lives in [place] by herself.    FAMILY HISTORY: Family History  Problem Relation Age of Onset  . Breast cancer Maternal Aunt   . Breast cancer Cousin   . Brain cancer Maternal Uncle     ALLERGIES:  is allergic to no known allergies.  MEDICATIONS:  Current Facility-Administered Medications  Medication Dose Route Frequency Provider Last Rate Last Dose  . 0.9 %  sodium chloride infusion  250 mL Intravenous PRN Srikar Sudini, MD      . acetaminophen (TYLENOL) tablet 650 mg  650 mg Oral Q6H PRN Hillary Bow, MD       Or  . acetaminophen (TYLENOL) suppository 650 mg  650 mg Rectal Q6H PRN Srikar Sudini, MD      . anastrozole (ARIMIDEX) tablet 1 mg  1 mg Oral Daily Srikar Sudini, MD   1 mg at 01/13/17 1000  . aspirin EC tablet 81 mg  81 mg Oral Daily Hillary Bow, MD   81 mg at 01/13/17 0913  . diltiazem (CARDIZEM) injection 10 mg  10 mg Intravenous Once Saundra Shelling, MD   Stopped at 01/13/17 0405  . diphenoxylate-atropine (LOMOTIL) 2.5-0.025 MG per tablet 1 tablet  1 tablet Oral QID PRN Srikar Sudini, MD      . DULoxetine (CYMBALTA) DR capsule 60 mg  60 mg Oral Daily  Hillary Bow, MD   60 mg at 01/13/17 0913  . enoxaparin (LOVENOX) injection 70 mg  1 mg/kg Subcutaneous Q12H Srikar Sudini, MD   70 mg at 01/13/17 0537  . feeding supplement (ENSURE ENLIVE) (ENSURE ENLIVE) liquid 237 mL  237 mL Oral BID BM Srikar Sudini, MD   237 mL at 01/13/17 0920  . metoprolol (LOPRESSOR) injection 5 mg  5 mg Intravenous Q4H PRN Saundra Shelling, MD   5 mg at 01/13/17 0315  . metoprolol tartrate (LOPRESSOR) tablet 12.5 mg  12.5 mg Oral BID Hillary Bow, MD   12.5 mg at 01/13/17 0913  . ondansetron (ZOFRAN) tablet 4 mg  4 mg Oral Q6H PRN Hillary Bow, MD       Or  . ondansetron (ZOFRAN) injection 4 mg  4 mg Intravenous Q6H PRN Srikar Sudini, MD      . pantoprazole (PROTONIX) EC tablet 40 mg  40 mg Oral BID Hillary Bow, MD  40 mg at 01/13/17 0913  . polyethylene glycol (MIRALAX / GLYCOLAX) packet 17 g  17 g Oral Daily PRN Srikar Sudini, MD      . polyethylene glycol (MIRALAX / GLYCOLAX) packet 17 g  17 g Oral Daily Hillary Bow, MD   17 g at 01/13/17 0913  . potassium chloride SA (K-DUR,KLOR-CON) CR tablet 40 mEq  40 mEq Oral BID Vaughan Basta, MD      . sodium chloride flush (NS) 0.9 % injection 10-40 mL  10-40 mL Intracatheter Q12H Srikar Sudini, MD   10 mL at 01/13/17 0923  . sodium chloride flush (NS) 0.9 % injection 10-40 mL  10-40 mL Intracatheter PRN Srikar Sudini, MD      . sodium chloride flush (NS) 0.9 % injection 3 mL  3 mL Intravenous Q12H Srikar Sudini, MD      . sodium chloride flush (NS) 0.9 % injection 3 mL  3 mL Intravenous Q12H Hillary Bow, MD   3 mL at 01/12/17 2122  . sodium chloride flush (NS) 0.9 % injection 3 mL  3 mL Intravenous PRN Hillary Bow, MD       Facility-Administered Medications Ordered in Other Encounters  Medication Dose Route Frequency Provider Last Rate Last Dose  . 0.9 %  sodium chloride infusion   Intravenous Continuous [name], MD      . 0.9 %  sodium chloride infusion   Intravenous Continuous Lloyd Huger, MD  999 mL/hr at 04/24/15 1520    . heparin lock flush 100 unit/mL  500 Units Intravenous Once Dmitriy Berenzon, MD      . sodium chloride 0.9 % injection 10 mL  10 mL Intravenous PRN [name], MD   10 mL at 04/04/15 1440  . sodium chloride flush (NS) 0.9 % injection 10 mL  10 mL Intravenous PRN Dmitriy Berenzon, MD      . Tbo-Filgrastim [facility]) injection 480 mcg  480 mcg Subcutaneous Once Cammie Sickle, MD          .  PHYSICAL EXAMINATION:  Vitals:   01/13/17 0601 01/13/17 0913  BP: (!) 93/42 105/62  Pulse: 87 90  Resp:    Temp:     Filed Weights   01/12/17 1413 01/13/17 0505  Weight: 150 lb (68 kg) 136 lb 4.8 oz (61.8 kg)    GENERAL: Well-nourished well-developed; Alert, no distress and comfortable.   Alone.  EYES: no pallor or icterus OROPHARYNX: no thrush or ulceration. NECK: supple, no masses felt LYMPH:  no palpable lymphadenopathy in the cervical, axillary or inguinal regions LUNGS: decreased breath sounds to auscultation at bases and  No wheeze or crackles HEART/CVS: regular rate & rhythm and no murmurs; No lower extremity edema ABDOMEN: abdomen soft, non-tender and normal bowel sounds Musculoskeletal:no cyanosis of digits and no clubbing  PSYCH: alert & oriented x 3 with fluent speech NEURO: no focal motor/sensory deficits SKIN:  no rashes or significant lesions  LABORATORY DATA:  I have reviewed the data as listed Lab Results  Component Value Date   WBC 3.3 (L) 01/12/2017   HGB 10.2 (L) 01/12/2017   HCT 30.5 (L) 01/12/2017   MCV 90.3 01/12/2017   PLT 157 01/12/2017    Recent Labs  10/19/16 1525 10/26/16 0825  01/07/17 0814 01/11/17 1628 01/12/17 1514 01/13/17 0324  NA  --   --   < > 136 130* 135 134*  K  --   --   < > 3.6 3.3* 2.9* 2.8*  CL  --   --   < >  101 96* 100* 101  CO2  --   --   < > _0 GLUCOSE  --   --   < > 91 105* 151* 97  BUN  --   --   < > _1 CREATININE  --   --   < > 0.53 0.53 0.51 0.43*  CALCIUM  --    --   < > 8.4* 8.3* 8.1* 7.5*  GFRNONAA  --   --   < > >60 >60 >60 >60  GFRAA  --   --   < > >60 >60 >60 >60  PROT 7.8 7.8  < > 6.5 6.5 6.1*  --   ALBUMIN 4.0 3.9  < > 2.6* 2.5* 2.3*  --   AST 130* 145*  < > 54* 88* 85*  --   ALT 138* 137*  < > 32 30 27  --   ALKPHOS 291* 304*  < > 317* 246* 217*  --   BILITOT 0.8 1.1  < > 1.3* 1.5* 1.5*  --   BILIDIR 0.2 <0.1*  --   --   --  0.4  --   IBILI 0.6 NOT CALCULATED  --   --   --  1.1*  --   < > = values in this interval not displayed.  RADIOGRAPHIC STUDIES: I have personally reviewed the radiological images as listed and agreed with the findings in the report. Dg Chest 2 View  Result Date: 01/12/2017 CLINICAL DATA:  Hypotension yesterday. EXAM: CHEST  2 VIEW COMPARISON:  02/25/2015 FINDINGS: Right-sided power port remains in place with tip in the SVC 2 cm above the right atrium. Heart size is normal. Chronic aortic atherosclerosis. The lungs are clear. Pulmonary vascularity is normal. Nipple shadow on the right. Ordinary mild degenerative changes affect the spine. IMPRESSION: No active disease. Power port remains in good position. Aortic atherosclerosis. Electronically Signed   By: Nelson Chimes M.D.   On: 01/12/2017 14:53   Ct Angio Chest Pe W/cm &/or Wo Cm  Result Date: 01/12/2017 CLINICAL DATA:  61 year old female with history of hypotension presenting with chest pain. Tachycardia. Weakness. History of breast cancer. EXAM: CT ANGIOGRAPHY CHEST WITH CONTRAST TECHNIQUE: Multidetector CT imaging of the chest was performed using the standard protocol during bolus administration of intravenous contrast. Multiplanar CT image reconstructions and MIPs were obtained to evaluate the vascular anatomy. CONTRAST:  75 mL of Isovue 370. COMPARISON:  Chest CT 11/02/2016. FINDINGS: Cardiovascular: In a segmental sized branch extending to the inferior segment of the lingula there is a nonocclusive filling defect, compatible with pulmonary embolism. This is slightly  eccentrically located. No larger central filling defects are noted. Heart size is normal. There is no significant pericardial fluid, thickening or pericardial calcification. Right-sided subclavian single-lumen porta cath with tip terminating in the right atrium. Mediastinum/Nodes: No pathologically enlarged mediastinal or hilar lymph nodes. Esophagus is unremarkable in appearance. No axillary lymphadenopathy. Lungs/Pleura: The small nodular area of architectural distortion in the superior segment of the left lower lobe measuring only 5 mm (axial image 37 of series 6) is stable in appearance compared to the prior study, favored to reflect an area of post infectious or inflammatory scarring. No other suspicious appearing pulmonary nodules or masses are noted. Mild diffuse bronchial wall thickening with mild centrilobular and paraseptal emphysema. No acute consolidative airspace disease. No pleural effusions. Upper Abdomen: Multiple poorly defined hypovascular lesions are again noted throughout the liver. Although direct  comparison with the prior examination is not possible given significant differences in bolus timing, the size of these lesions appears generally decreased compared to the prior study, suggesting a positive response to therapy. Musculoskeletal: There are no aggressive appearing lytic or blastic lesions noted in the visualized portions of the skeleton. Review of the MIP images confirms the above findings. IMPRESSION: 1. Study is positive for a small nonocclusive pulmonary embolism to the inferior segment of the lingula. No evidence to suggest associated right-sided heart strain. Given that this filling defect is somewhat eccentric, this is favored to represent chronic thromboembolism rather than an acute embolic event. 2. Widespread metastatic disease to the liver again noted, with general decrease in size of the numerous hepatic lesions compared to the prior study, indicative of a positive response to  therapy. 3. Tiny 5 mm nodule in the superior segment of the left lower lobe is unchanged, and nonspecific, but favored to represent a tiny focus of post infectious or inflammatory scarring. Attention at time of routine followup studies is recommended to ensure continued stability. 4. Aortic atherosclerosis. Electronically Signed   By: Vinnie Langton M.D.   On: 01/12/2017 17:07   US Venous Img Lower Bilateral  Result Date: 01/13/2017 CLINICAL DATA:  Pulmonary emboli. Bilateral lower extremity pain and swelling for several weeks. History of breast cancer. EXAM: BILATERAL LOWER EXTREMITY VENOUS DOPPLER ULTRASOUND TECHNIQUE: Gray-scale sonography with graded compression, as well as color Doppler and duplex ultrasound were performed to evaluate the lower extremity deep venous systems from the level of the common femoral vein and including the common femoral, femoral, profunda femoral, popliteal and calf veins including the posterior tibial, peroneal and gastrocnemius veins when visible. The superficial great saphenous vein was also interrogated. Spectral Doppler was utilized to evaluate flow at rest and with distal augmentation maneuvers in the common femoral, femoral and popliteal veins. COMPARISON:  None. FINDINGS: RIGHT LOWER EXTREMITY Common Femoral Vein: No evidence of thrombus. Normal compressibility, respiratory phasicity and response to augmentation. Saphenofemoral Junction: No evidence of thrombus. Normal compressibility and flow on color Doppler imaging. Profunda Femoral Vein: No evidence of thrombus. Normal compressibility and flow on color Doppler imaging. Femoral Vein: No evidence of thrombus. Normal compressibility, respiratory phasicity and response to augmentation. Popliteal Vein: No evidence of thrombus. Normal compressibility, respiratory phasicity and response to augmentation. Calf Veins: No evidence of thrombus. Normal compressibility and flow on color Doppler imaging. Superficial Great  Saphenous Vein: No evidence of thrombus. Normal compressibility and flow on color Doppler imaging. Venous Reflux:  None. Other Findings:  None. LEFT LOWER EXTREMITY Common Femoral Vein: No evidence of thrombus. Normal compressibility, respiratory phasicity and response to augmentation. Saphenofemoral Junction: No evidence of thrombus. Normal compressibility and flow on color Doppler imaging. Profunda Femoral Vein: No evidence of thrombus. Normal compressibility and flow on color Doppler imaging. Femoral Vein: No evidence of thrombus. Normal compressibility, respiratory phasicity and response to augmentation. Popliteal Vein: No evidence of thrombus. Normal compressibility, respiratory phasicity and response to augmentation. Calf Veins: No evidence of thrombus. Normal compressibility and flow on color Doppler imaging. Superficial Great Saphenous Vein: No evidence of thrombus. Normal compressibility and flow on color Doppler imaging. Venous Reflux:  None. Other Findings:  None. IMPRESSION: No evidence of deep venous thrombosis. Electronically Signed   By: Logan Bores M.D.   On: 01/13/2017 10:20    ASSESSMENT & PLAN:   61 year old female patient with metastatic breast cancer currently on palliative treatment admitted to the hospital for tachycardia/dizziness; CTA shows-left lingular  PE  # RECURRENT Metastatic breast cancer- ER/PR positive HER-2/neu negative [status post liver biopsy February 2018; previously HER-2/neu positive]. currently on Eribulin. CTA showed improvement of liver lesions. Last received chemotherapy approximately 5 days ago.  # Small lingular PE appears chronic/no active lower extremity venous clots- continue Lovenox; switch over to Eliquis or xarelto; with out the loading dose [given history of GI bleed in past]   # Tachycardia question related to arrhythmia [severe hypokalemia] vs others- like adrenal insuffiency [prior history of steroids use]. Hypotension systolic blood pressures 01Q  to 80s- question secondary to metoprolol. Recommend cardiology evaluation.   # Brain mets- s/p RT [finished Feb 28th]; symptomatic improvement.  will plan to get MRI in 2-3 months;   Thank you Dr. Anselm Jungling for allowing me to participate in the care of your pleasant patient. Please do not hesitate to contact me with questions or concerns in the interim. Discussed with Dr. Anselm Jungling. All questions were answered. The patient knows to call the clinic with any problems, questions or concerns.    Cammie Sickle, MD 01/13/2017 1:18 PM

## 2017-01-13 NOTE — Progress Notes (Signed)
Notified MD Pyreddy of patient's BP ranging from 73/34-93/42. No new orders at this time other than to monitor closely. Nursing staff will continue to monitor for any changes in patient status. Earleen Reaper, RN

## 2017-01-13 NOTE — Progress Notes (Signed)
*  PRELIMINARY RESULTS* Echocardiogram 2D Echocardiogram has been performed.  Sherrie Sport 01/13/2017, 10:39 AM

## 2017-01-13 NOTE — Progress Notes (Signed)
[facility]  Patient Status: Active  [facility] is providing the following services: SN/PT/OT/SW  If patient discharges after hours, please call [phone].   Sarah Horne 01/13/2017, 10:16 AM

## 2017-01-13 NOTE — Progress Notes (Signed)
[facility] [place] NAME: Sarah Horne    MR#:  [id number]  DATE OF BIRTH:  January 25, 1956  SUBJECTIVE:  CHIEF COMPLAINT:   Chief Complaint  Patient presents with  . Tachycardia  sent from cardiologist office for tachycardia. Hx of breast cancer with mets on radiation and chemo.   Found to have PE and also have electrolyte imbalance with tachycardia and hypotension.  REVIEW OF SYSTEMS:  CONSTITUTIONAL: No fever, fatigue or weakness.  EYES: No blurred or double vision.  EARS, NOSE, AND THROAT: No tinnitus or ear pain.  RESPIRATORY: No cough,positive for shortness of breath,no wheezing or hemoptysis.  CARDIOVASCULAR: No chest pain, orthopnea, edema.  GASTROINTESTINAL: No nausea, vomiting, diarrhea or abdominal pain.  GENITOURINARY: No dysuria, hematuria.  ENDOCRINE: No polyuria, nocturia,  HEMATOLOGY: No anemia, easy bruising or bleeding SKIN: No rash or lesion. MUSCULOSKELETAL: No joint pain or arthritis.   NEUROLOGIC: No tingling, numbness, weakness.  PSYCHIATRY: No anxiety or depression.   ROS  DRUG ALLERGIES:   Allergies  Allergen Reactions  . No Known Allergies     VITALS:  Blood pressure 105/64, pulse (!) 127, temperature 98.1 F (36.7 C), temperature source Oral, resp. rate 18, height '5\' 6"'$  (1.676 m), weight 61.8 kg (136 lb 4.8 oz), SpO2 99 %.  PHYSICAL EXAMINATION:  GENERAL:  61 y.o.-year-old thin patient lying in the bed with no acute distress.  EYES: Pupils equal, round, reactive to light and accommodation. No scleral icterus. Extraocular muscles intact.  HEENT: Head atraumatic, normocephalic. Oropharynx and nasopharynx clear.  NECK:  Supple, no jugular venous distention. No thyroid enlargement, no tenderness.  LUNGS: Normal breath sounds bilaterally, no wheezing, rales,rhonchi or crepitation. No use of accessory muscles of respiration.  CARDIOVASCULAR: S1, S2 normal. No murmurs, rubs, or gallops.  ABDOMEN: Soft,  nontender, nondistended. Bowel sounds present. No organomegaly or mass.  EXTREMITIES: No pedal edema, cyanosis, or clubbing.  NEUROLOGIC: Cranial nerves II through XII are intact. Muscle strength 4- 5/5 in all extremities. Sensation intact. Gait not checked.  PSYCHIATRIC: The patient is alert and oriented x 3.  SKIN: No obvious rash, lesion, or ulcer.   Physical Exam LABORATORY PANEL:   CBC  Recent Labs Lab 01/12/17 1514  WBC 3.3*  HGB 10.2*  HCT 30.5*  PLT 157   ------------------------------------------------------------------------------------------------------------------  Chemistries   Recent Labs Lab 01/12/17 1514 01/13/17 0324  NA 135 134*  K 2.9* 2.8*  CL 100* 101  CO2 27 26  GLUCOSE 151* 97  BUN 9 7  CREATININE 0.51 0.43*  CALCIUM 8.1* 7.5*  MG  --  1.5*  AST 85*  --   ALT 27  --   ALKPHOS 217*  --   BILITOT 1.5*  --    ------------------------------------------------------------------------------------------------------------------  Cardiac Enzymes  Recent Labs Lab 01/11/17 2253 01/12/17 1514  TROPONINI 0.05* 0.06*   ------------------------------------------------------------------------------------------------------------------  RADIOLOGY:  Dg Chest 2 View  Result Date: 01/12/2017 CLINICAL DATA:  Hypotension yesterday. EXAM: CHEST  2 VIEW COMPARISON:  02/25/2015 FINDINGS: Right-sided power port remains in place with tip in the SVC 2 cm above the right atrium. Heart size is normal. Chronic aortic atherosclerosis. The lungs are clear. Pulmonary vascularity is normal. Nipple shadow on the right. Ordinary mild degenerative changes affect the spine. IMPRESSION: No active disease. Power port remains in good position. Aortic atherosclerosis. Electronically Signed   By: Nelson Chimes M.D.   On: 01/12/2017 14:53   Ct Angio Chest Pe W/cm &/or Wo Cm  Result Date: 01/12/2017 CLINICAL DATA:  61 year old female with history of hypotension presenting with chest  pain. Tachycardia. Weakness. History of breast cancer. EXAM: CT ANGIOGRAPHY CHEST WITH CONTRAST TECHNIQUE: Multidetector CT imaging of the chest was performed using the standard protocol during bolus administration of intravenous contrast. Multiplanar CT image reconstructions and MIPs were obtained to evaluate the vascular anatomy. CONTRAST:  75 mL of Isovue 370. COMPARISON:  Chest CT 11/02/2016. FINDINGS: Cardiovascular: In a segmental sized branch extending to the inferior segment of the lingula there is a nonocclusive filling defect, compatible with pulmonary embolism. This is slightly eccentrically located. No larger central filling defects are noted. Heart size is normal. There is no significant pericardial fluid, thickening or pericardial calcification. Right-sided subclavian single-lumen porta cath with tip terminating in the right atrium. Mediastinum/Nodes: No pathologically enlarged mediastinal or hilar lymph nodes. Esophagus is unremarkable in appearance. No axillary lymphadenopathy. Lungs/Pleura: The small nodular area of architectural distortion in the superior segment of the left lower lobe measuring only 5 mm (axial image 37 of series 6) is stable in appearance compared to the prior study, favored to reflect an area of post infectious or inflammatory scarring. No other suspicious appearing pulmonary nodules or masses are noted. Mild diffuse bronchial wall thickening with mild centrilobular and paraseptal emphysema. No acute consolidative airspace disease. No pleural effusions. Upper Abdomen: Multiple poorly defined hypovascular lesions are again noted throughout the liver. Although direct comparison with the prior examination is not possible given significant differences in bolus timing, the size of these lesions appears generally decreased compared to the prior study, suggesting a positive response to therapy. Musculoskeletal: There are no aggressive appearing lytic or blastic lesions noted in the  visualized portions of the skeleton. Review of the MIP images confirms the above findings. IMPRESSION: 1. Study is positive for a small nonocclusive pulmonary embolism to the inferior segment of the lingula. No evidence to suggest associated right-sided heart strain. Given that this filling defect is somewhat eccentric, this is favored to represent chronic thromboembolism rather than an acute embolic event. 2. Widespread metastatic disease to the liver again noted, with general decrease in size of the numerous hepatic lesions compared to the prior study, indicative of a positive response to therapy. 3. Tiny 5 mm nodule in the superior segment of the left lower lobe is unchanged, and nonspecific, but favored to represent a tiny focus of post infectious or inflammatory scarring. Attention at time of routine followup studies is recommended to ensure continued stability. 4. Aortic atherosclerosis. Electronically Signed   By: Vinnie Langton M.D.   On: 01/12/2017 17:07   US Venous Img Lower Bilateral  Result Date: 01/13/2017 CLINICAL DATA:  Pulmonary emboli. Bilateral lower extremity pain and swelling for several weeks. History of breast cancer. EXAM: BILATERAL LOWER EXTREMITY VENOUS DOPPLER ULTRASOUND TECHNIQUE: Gray-scale sonography with graded compression, as well as color Doppler and duplex ultrasound were performed to evaluate the lower extremity deep venous systems from the level of the common femoral vein and including the common femoral, femoral, profunda femoral, popliteal and calf veins including the posterior tibial, peroneal and gastrocnemius veins when visible. The superficial great saphenous vein was also interrogated. Spectral Doppler was utilized to evaluate flow at rest and with distal augmentation maneuvers in the common femoral, femoral and popliteal veins. COMPARISON:  None. FINDINGS: RIGHT LOWER EXTREMITY Common Femoral Vein: No evidence of thrombus. Normal compressibility, respiratory phasicity  and response to augmentation. Saphenofemoral Junction: No evidence of thrombus. Normal compressibility and flow on color Doppler  imaging. Profunda Femoral Vein: No evidence of thrombus. Normal compressibility and flow on color Doppler imaging. Femoral Vein: No evidence of thrombus. Normal compressibility, respiratory phasicity and response to augmentation. Popliteal Vein: No evidence of thrombus. Normal compressibility, respiratory phasicity and response to augmentation. Calf Veins: No evidence of thrombus. Normal compressibility and flow on color Doppler imaging. Superficial Great Saphenous Vein: No evidence of thrombus. Normal compressibility and flow on color Doppler imaging. Venous Reflux:  None. Other Findings:  None. LEFT LOWER EXTREMITY Common Femoral Vein: No evidence of thrombus. Normal compressibility, respiratory phasicity and response to augmentation. Saphenofemoral Junction: No evidence of thrombus. Normal compressibility and flow on color Doppler imaging. Profunda Femoral Vein: No evidence of thrombus. Normal compressibility and flow on color Doppler imaging. Femoral Vein: No evidence of thrombus. Normal compressibility, respiratory phasicity and response to augmentation. Popliteal Vein: No evidence of thrombus. Normal compressibility, respiratory phasicity and response to augmentation. Calf Veins: No evidence of thrombus. Normal compressibility and flow on color Doppler imaging. Superficial Great Saphenous Vein: No evidence of thrombus. Normal compressibility and flow on color Doppler imaging. Venous Reflux:  None. Other Findings:  None. IMPRESSION: No evidence of deep venous thrombosis. Electronically Signed   By: Logan Bores M.D.   On: 01/13/2017 10:20    ASSESSMENT AND PLAN:   Active Problems:   Pulmonary emboli (HCC)  * Left lower segmental PE with significant tachycardia. Patient has no shortness of breath. Tachycardia in the 130s to 140s. She was also started recently on metoprolol for  the same by her cardiologist. Start patient on Lovenox and admitted on telemetry for overnight monitoring. Continue the metoprolol , BP running on lower side. Check echocardiogram. Consult hematology oncology Check lower extremity venous Dopplers   Called cardiology consult.  * Mild elevation troponin is stable.  Echocardiogram ordered. Unlikely MI. Likely due to pulmonary embolism.  * Breast cancer, stage IV. Patient is on chemotherapy and sees Dr. Rogue Bussing. called consult.  * hypokalemia   Replace oral, recheck tomorrow   Check Mg.   All the records are reviewed and case discussed with Care Management/Social Workerr. Management plans discussed with the patient, family and they are in agreement.  CODE STATUS: Full.  TOTAL TIME TAKING CARE OF THIS PATIENT:35 minutes.     POSSIBLE D/C IN 1-2 DAYS, DEPENDING ON CLINICAL CONDITION.   Vaughan Basta M.D on 01/13/2017   Between 7am to 6pm - Pager - [phone]  After 6pm go to [email or web address] - password [email or web address] [facility]  Office  [phone]  CC: Primary care physician; Keith Rake, MD  Note: This dictation was prepared with Dragon dictation along with smaller phrase technology. Any transcriptional errors that result from this process are unintentional.

## 2017-01-14 DIAGNOSIS — I2609 Other pulmonary embolism with acute cor pulmonale: Secondary | ICD-10-CM

## 2017-01-14 DIAGNOSIS — D72819 Decreased white blood cell count, unspecified: Secondary | ICD-10-CM | POA: Diagnosis not present

## 2017-01-14 DIAGNOSIS — C787 Secondary malignant neoplasm of liver and intrahepatic bile duct: Secondary | ICD-10-CM

## 2017-01-14 DIAGNOSIS — Z9221 Personal history of antineoplastic chemotherapy: Secondary | ICD-10-CM | POA: Diagnosis not present

## 2017-01-14 DIAGNOSIS — I471 Supraventricular tachycardia: Secondary | ICD-10-CM | POA: Diagnosis not present

## 2017-01-14 DIAGNOSIS — E876 Hypokalemia: Secondary | ICD-10-CM

## 2017-01-14 DIAGNOSIS — R001 Bradycardia, unspecified: Secondary | ICD-10-CM | POA: Diagnosis not present

## 2017-01-14 DIAGNOSIS — R000 Tachycardia, unspecified: Secondary | ICD-10-CM | POA: Diagnosis not present

## 2017-01-14 DIAGNOSIS — C50919 Malignant neoplasm of unspecified site of unspecified female breast: Secondary | ICD-10-CM | POA: Diagnosis not present

## 2017-01-14 DIAGNOSIS — K1230 Oral mucositis (ulcerative), unspecified: Secondary | ICD-10-CM | POA: Diagnosis not present

## 2017-01-14 DIAGNOSIS — C50412 Malignant neoplasm of upper-outer quadrant of left female breast: Secondary | ICD-10-CM | POA: Diagnosis not present

## 2017-01-14 DIAGNOSIS — D709 Neutropenia, unspecified: Secondary | ICD-10-CM | POA: Diagnosis not present

## 2017-01-14 DIAGNOSIS — Z170 Estrogen receptor positive status [ER+]: Secondary | ICD-10-CM | POA: Diagnosis not present

## 2017-01-14 DIAGNOSIS — G620 Drug-induced polyneuropathy: Secondary | ICD-10-CM

## 2017-01-14 DIAGNOSIS — R42 Dizziness and giddiness: Secondary | ICD-10-CM | POA: Diagnosis not present

## 2017-01-14 DIAGNOSIS — I2699 Other pulmonary embolism without acute cor pulmonale: Secondary | ICD-10-CM | POA: Diagnosis not present

## 2017-01-14 LAB — CBC
HCT: 30.2 % — ABNORMAL LOW (ref 35.0–47.0)
Hemoglobin: 9.9 g/dL — ABNORMAL LOW (ref 12.0–16.0)
MCH: 29.8 pg (ref 26.0–34.0)
MCHC: 32.8 g/dL (ref 32.0–36.0)
MCV: 90.8 fL (ref 80.0–100.0)
PLATELETS: 161 10*3/uL (ref 150–440)
RBC: 3.33 MIL/uL — AB (ref 3.80–5.20)
RDW: 23.6 % — ABNORMAL HIGH (ref 11.5–14.5)
WBC: 0.6 10*3/uL — CL (ref 3.6–11.0)

## 2017-01-14 LAB — BASIC METABOLIC PANEL
Anion gap: 7 (ref 5–15)
CALCIUM: 7.9 mg/dL — AB (ref 8.9–10.3)
CHLORIDE: 98 mmol/L — AB (ref 101–111)
CO2: 28 mmol/L (ref 22–32)
CREATININE: 0.4 mg/dL — AB (ref 0.44–1.00)
GFR calc Af Amer: >60 mL/min (ref >60)
Glucose, Bld: 88 mg/dL (ref 65–99)
POTASSIUM: 3.4 mmol/L — AB (ref 3.5–5.1)
Sodium: 133 mmol/L — ABNORMAL LOW (ref 135–145)

## 2017-01-14 LAB — MAGNESIUM: MAGNESIUM: 2 mg/dL (ref 1.7–2.4)

## 2017-01-14 MED ORDER — FLECAINIDE ACETATE 50 MG PO TABS
50.0000 mg | ORAL_TABLET | Freq: Two times a day (BID) | ORAL | Status: DC
  Administered 2017-01-14 – 2017-01-16 (×5): 50 mg via ORAL
  Filled 2017-01-14 (×6): qty 1

## 2017-01-14 MED ORDER — ENOXAPARIN SODIUM 60 MG/0.6ML ~~LOC~~ SOLN
1.0000 mg/kg | SUBCUTANEOUS | Status: DC
  Administered 2017-01-15 – 2017-01-16 (×2): 60 mg via SUBCUTANEOUS
  Filled 2017-01-14 (×2): qty 0.6

## 2017-01-14 MED ORDER — TBO-FILGRASTIM 480 MCG/0.8ML ~~LOC~~ SOSY
480.0000 ug | PREFILLED_SYRINGE | Freq: Every day | SUBCUTANEOUS | Status: DC
  Administered 2017-01-14 – 2017-01-15 (×2): 480 ug via SUBCUTANEOUS
  Filled 2017-01-14 (×5): qty 0.8

## 2017-01-14 MED ORDER — AMOXICILLIN-POT CLAVULANATE 875-125 MG PO TABS
1.0000 | ORAL_TABLET | Freq: Two times a day (BID) | ORAL | Status: DC
  Administered 2017-01-14 – 2017-01-16 (×5): 1 via ORAL
  Filled 2017-01-14 (×5): qty 1

## 2017-01-14 MED ORDER — AMOXICILLIN-POT CLAVULANATE 875-125 MG PO TABS: 1.0000 | ORAL_TABLET | Freq: Two times a day (BID) | ORAL | Status: DC

## 2017-01-14 MED ORDER — HYDROCODONE-ACETAMINOPHEN 5-325 MG PO TABS
1.0000 | ORAL_TABLET | ORAL | Status: DC | PRN
  Administered 2017-01-14 (×2): 1 via ORAL
  Filled 2017-01-14 (×2): qty 1

## 2017-01-14 MED ORDER — LIDOCAINE VISCOUS 2 % MT SOLN
15.0000 mL | Freq: Four times a day (QID) | OROMUCOSAL | Status: DC | PRN
  Administered 2017-01-14: 15 mL via OROMUCOSAL
  Filled 2017-01-14 (×2): qty 15

## 2017-01-14 NOTE — Progress Notes (Signed)
[facility] for electrolyte management  Pharmacy consulted for electrolyte management for 61 yo female admitted with PE. Patient received 4 g of Mg on 4/18. Patient currently ordered potassium 47mq PO BID.  Assessment: 4/19 0600 K 3.4 4/19 0600 Mg 2.0  Plan: Will continue 40 mEq BID and re-check with 4/20 AM labs.  Allergies  Allergen Reactions  . No Known Allergies     Patient Measurements: Height: '5\' 6"'$  (167.6 cm) Weight: 136 lb 1.6 oz (61.7 kg) IBW/kg (Calculated) : 59.3 Vital Signs: Temp: 99 F (37.2 C) (04/19 0900) Temp Source: Oral (04/19 0900) BP: 107/63 (04/19 0900) Pulse Rate: 126 (04/19 0900) Intake/Output from previous day: 04/18 0701 - 04/19 0700 In: 590 [P.O.:480; I.V.:10; IV Piggyback:100] Out: 250 [Urine:250] Intake/Output from this shift: No intake/output data recorded.  Labs:  Recent Labs  01/11/17 1628 01/12/17 1514 01/13/17 0324 01/14/17 0610  WBC 3.5* 3.3*  --  0.6*  HGB 11.1* 10.2*  --  9.9*  HCT 33.2* 30.5*  --  30.2*  PLT 187 157  --  161  CREATININE 0.53 0.51 0.43* 0.40*  MG  --   --  1.5* 2.0  ALBUMIN 2.5* 2.3*  --   --   PROT 6.5 6.1*  --   --   AST 88* 85*  --   --   ALT 30 27  --   --   ALKPHOS 246* 217*  --   --   BILITOT 1.5* 1.5*  --   --   BILIDIR  --  0.4  --   --   IBILI  --  1.1*  --   --    Estimated Creatinine Clearance: 67.4 mL/min (A) (by C-G formula based on SCr of 0.4 mg/dL (L)).    Aniketh Huberty 01/14/2017,10:12 AM

## 2017-01-14 NOTE — Plan of Care (Signed)
Problem: Safety: Goal: Ability to remain free from injury will improve Outcome: Progressing Pt encouraged to call for assistance with activity if and when needed.

## 2017-01-14 NOTE — Progress Notes (Signed)
Sarah Horne   DOB:01/28/1956   IO#:[id number]    Subjective: Patient resting in the bed. Comfortably. Denies any fevers. Denies any chills. Complains sores in the mouth.  No  Objective:  Vitals:   01/14/17 0416 01/14/17 0900  BP: 107/62 107/63  Pulse: 95 (!) 126  Resp: 18 19  Temp: 98.2 F (36.8 C) 99 F (37.2 C)     Intake/Output Summary (Last 24 hours) at 01/14/17 1526 Last data filed at 01/14/17 0945  Gross per 24 hour  Intake              400 ml  Output                0 ml  Net              400 ml    GENERAL: Well-nourished well-developed; Alert, no distress and comfortable.   Alone.  EYES: no pallor or icterus OROPHARYNX: no thrush. So in the left upper lip. NECK: supple, no masses felt LYMPH:  no palpable lymphadenopathy in the cervical, axillary or inguinal regions LUNGS: decreased breath sounds to auscultation at bases and  No wheeze or crackles HEART/CVS: regular rate & rhythm and no murmurs; No lower extremity edema ABDOMEN: abdomen soft, non-tender and normal bowel sounds Musculoskeletal:no cyanosis of digits and no clubbing  PSYCH: alert & oriented x 3 with fluent speech NEURO: no focal motor/sensory deficits SKIN:  no rashes or significant lesions   Labs:  Lab Results  Component Value Date   WBC 0.6 (LL) 01/14/2017   HGB 9.9 (L) 01/14/2017   HCT 30.2 (L) 01/14/2017   MCV 90.8 01/14/2017   PLT 161 01/14/2017   NEUTROABS 3.1 01/11/2017    Lab Results  Component Value Date   NA 133 (L) 01/14/2017   K 3.4 (L) 01/14/2017   CL 98 (L) 01/14/2017   CO2 28 01/14/2017    Studies:  Ct Angio Chest Pe W/cm &/or Wo Cm  Result Date: 01/12/2017 CLINICAL DATA:  61 year old female with history of hypotension presenting with chest pain. Tachycardia. Weakness. History of breast cancer. EXAM: CT ANGIOGRAPHY CHEST WITH CONTRAST TECHNIQUE: Multidetector CT imaging of the chest was performed using the standard protocol during bolus administration of intravenous  contrast. Multiplanar CT image reconstructions and MIPs were obtained to evaluate the vascular anatomy. CONTRAST:  75 mL of Isovue 370. COMPARISON:  Chest CT 11/02/2016. FINDINGS: Cardiovascular: In a segmental sized branch extending to the inferior segment of the lingula there is a nonocclusive filling defect, compatible with pulmonary embolism. This is slightly eccentrically located. No larger central filling defects are noted. Heart size is normal. There is no significant pericardial fluid, thickening or pericardial calcification. Right-sided subclavian single-lumen porta cath with tip terminating in the right atrium. Mediastinum/Nodes: No pathologically enlarged mediastinal or hilar lymph nodes. Esophagus is unremarkable in appearance. No axillary lymphadenopathy. Lungs/Pleura: The small nodular area of architectural distortion in the superior segment of the left lower lobe measuring only 5 mm (axial image 37 of series 6) is stable in appearance compared to the prior study, favored to reflect an area of post infectious or inflammatory scarring. No other suspicious appearing pulmonary nodules or masses are noted. Mild diffuse bronchial wall thickening with mild centrilobular and paraseptal emphysema. No acute consolidative airspace disease. No pleural effusions. Upper Abdomen: Multiple poorly defined hypovascular lesions are again noted throughout the liver. Although direct comparison with the prior examination is not possible given significant differences in bolus timing, the size of  these lesions appears generally decreased compared to the prior study, suggesting a positive response to therapy. Musculoskeletal: There are no aggressive appearing lytic or blastic lesions noted in the visualized portions of the skeleton. Review of the MIP images confirms the above findings. IMPRESSION: 1. Study is positive for a small nonocclusive pulmonary embolism to the inferior segment of the lingula. No evidence to suggest  associated right-sided heart strain. Given that this filling defect is somewhat eccentric, this is favored to represent chronic thromboembolism rather than an acute embolic event. 2. Widespread metastatic disease to the liver again noted, with general decrease in size of the numerous hepatic lesions compared to the prior study, indicative of a positive response to therapy. 3. Tiny 5 mm nodule in the superior segment of the left lower lobe is unchanged, and nonspecific, but favored to represent a tiny focus of post infectious or inflammatory scarring. Attention at time of routine followup studies is recommended to ensure continued stability. 4. Aortic atherosclerosis. Electronically Signed   By: Vinnie Langton M.D.   On: 01/12/2017 17:07   US Venous Img Lower Bilateral  Result Date: 01/13/2017 CLINICAL DATA:  Pulmonary emboli. Bilateral lower extremity pain and swelling for several weeks. History of breast cancer. EXAM: BILATERAL LOWER EXTREMITY VENOUS DOPPLER ULTRASOUND TECHNIQUE: Gray-scale sonography with graded compression, as well as color Doppler and duplex ultrasound were performed to evaluate the lower extremity deep venous systems from the level of the common femoral vein and including the common femoral, femoral, profunda femoral, popliteal and calf veins including the posterior tibial, peroneal and gastrocnemius veins when visible. The superficial great saphenous vein was also interrogated. Spectral Doppler was utilized to evaluate flow at rest and with distal augmentation maneuvers in the common femoral, femoral and popliteal veins. COMPARISON:  None. FINDINGS: RIGHT LOWER EXTREMITY Common Femoral Vein: No evidence of thrombus. Normal compressibility, respiratory phasicity and response to augmentation. Saphenofemoral Junction: No evidence of thrombus. Normal compressibility and flow on color Doppler imaging. Profunda Femoral Vein: No evidence of thrombus. Normal compressibility and flow on color  Doppler imaging. Femoral Vein: No evidence of thrombus. Normal compressibility, respiratory phasicity and response to augmentation. Popliteal Vein: No evidence of thrombus. Normal compressibility, respiratory phasicity and response to augmentation. Calf Veins: No evidence of thrombus. Normal compressibility and flow on color Doppler imaging. Superficial Great Saphenous Vein: No evidence of thrombus. Normal compressibility and flow on color Doppler imaging. Venous Reflux:  None. Other Findings:  None. LEFT LOWER EXTREMITY Common Femoral Vein: No evidence of thrombus. Normal compressibility, respiratory phasicity and response to augmentation. Saphenofemoral Junction: No evidence of thrombus. Normal compressibility and flow on color Doppler imaging. Profunda Femoral Vein: No evidence of thrombus. Normal compressibility and flow on color Doppler imaging. Femoral Vein: No evidence of thrombus. Normal compressibility, respiratory phasicity and response to augmentation. Popliteal Vein: No evidence of thrombus. Normal compressibility, respiratory phasicity and response to augmentation. Calf Veins: No evidence of thrombus. Normal compressibility and flow on color Doppler imaging. Superficial Great Saphenous Vein: No evidence of thrombus. Normal compressibility and flow on color Doppler imaging. Venous Reflux:  None. Other Findings:  None. IMPRESSION: No evidence of deep venous thrombosis. Electronically Signed   By: Logan Bores M.D.   On: 01/13/2017 10:20    Assessment & Plan:  61 year old female patient with metastatic breast cancer currently on palliative treatment admitted to the hospital for tachycardia/dizziness; CTA shows-left lingular PE  # RECURRENT Metastatic breast cancer- ER/PR positive HER-2/neu negative [status post liver biopsy February 2018; previously  HER-2/neu positive]. currently on Eribulin. CTA showed improvement of liver lesions. Last received chemotherapy approximately 6 days ago. Patient again  due for chemotherapy tomorrow which will hold at this time [facility] discussion below]  # Severe neutropenia- white count 0.6. Recommend Granix subcutaneous today.   # Mucositis continue Magic mouthwash.  # Small lingular PE appears chronic/no active lower extremity venous clots- continue Lovenox- however controlled on the Lovenox to 1 mg/kg subcutaneous once a day.Switch over to Eliquis or xarelto- prophylactic dose/as patient is at high risk for DVT/PE given her active malignancy.  # Tachycardia question related to arrhythmia [severe hypokalemia] vs others. Started on flecainide. appreciate cardiology evaluation. Recommendation noted.  # Discussed with Dr. Verdell Carmine.   Cammie Sickle, MD 01/14/2017  3:26 PM

## 2017-01-14 NOTE — Progress Notes (Signed)
[facility] [place] NAME: Sarah Horne    MR#:  [id number]  DATE OF BIRTH:  [birth year]  SUBJECTIVE:   She is here due to tachycardia, dizziness and noted to have a left-sided lingular pulmonary embolism. No other acute complaints presently. Tachycardia improved.  REVIEW OF SYSTEMS:    Review of Systems  Constitutional: Negative for chills and fever.  HENT: Negative for congestion and tinnitus.   Eyes: Negative for blurred vision and double vision.  Respiratory: Positive for shortness of breath. Negative for cough and wheezing.   Cardiovascular: Negative for chest pain, orthopnea and PND.  Gastrointestinal: Negative for abdominal pain, diarrhea, nausea and vomiting.  Genitourinary: Negative for dysuria and hematuria.  Neurological: Negative for dizziness, sensory change and focal weakness.  All other systems reviewed and are negative.   Nutrition: Heart Healthy Tolerating Diet: Yes Tolerating PT: Await Eval.   DRUG ALLERGIES:   Allergies  Allergen Reactions  . No Known Allergies     VITALS:  Blood pressure 107/63, pulse (!) 126, temperature 99 F (37.2 C), temperature source Oral, resp. rate 19, height 5' 6"  (1.676 m), weight 61.7 kg (136 lb 1.6 oz), SpO2 95 %.  PHYSICAL EXAMINATION:   Physical Exam  GENERAL:  61 y.o.-year-old patient lying in bed in mild resp. Distress.   EYES: Pupils equal, round, reactive to light. No scleral icterus. Extraocular muscles intact.  HEENT: Head atraumatic, normocephalic. Oropharynx and nasopharynx clear.  NECK:  Supple, no jugular venous distention. No thyroid enlargement, no tenderness.  LUNGS: Normal breath sounds bilaterally, no wheezing, rales, rhonchi. No use of accessory muscles of respiration.  CARDIOVASCULAR: S1, S2 normal. No murmurs, rubs, or gallops.  ABDOMEN: Soft, nontender, nondistended. Bowel sounds present. No organomegaly or mass.  EXTREMITIES: No cyanosis, clubbing or edema  b/l.    NEUROLOGIC: Cranial nerves II through XII are intact. No focal Motor or sensory deficits b/l.  Globally weak. PSYCHIATRIC: The patient is alert and oriented x 3.  SKIN: No obvious rash, lesion, or ulcer.    LABORATORY PANEL:   CBC  Recent Labs Lab 01/14/17 0610  WBC 0.6*  HGB 9.9*  HCT 30.2*  PLT 161   ------------------------------------------------------------------------------------------------------------------  Chemistries   Recent Labs Lab 01/12/17 1514  01/14/17 0610  NA 135  < > 133*  K 2.9*  < > 3.4*  CL 100*  < > 98*  CO2 27  < > 28  GLUCOSE 151*  < > 88  BUN 9  < > <5*  CREATININE 0.51  < > 0.40*  CALCIUM 8.1*  < > 7.9*  MG  --   < > 2.0  AST 85*  --   --   ALT 27  --   --   ALKPHOS 217*  --   --   BILITOT 1.5*  --   --   < > = values in this interval not displayed. ------------------------------------------------------------------------------------------------------------------  Cardiac Enzymes  Recent Labs Lab 01/12/17 1514  TROPONINI 0.06*   ------------------------------------------------------------------------------------------------------------------  RADIOLOGY:  US Venous Img Lower Bilateral  Result Date: 01/13/2017 CLINICAL DATA:  Pulmonary emboli. Bilateral lower extremity pain and swelling for several weeks. History of breast cancer. EXAM: BILATERAL LOWER EXTREMITY VENOUS DOPPLER ULTRASOUND TECHNIQUE: Gray-scale sonography with graded compression, as well as color Doppler and duplex ultrasound were performed to evaluate the lower extremity deep venous systems from the level of the common femoral vein and including the common femoral, femoral, profunda femoral, popliteal and calf  veins including the posterior tibial, peroneal and gastrocnemius veins when visible. The superficial great saphenous vein was also interrogated. Spectral Doppler was utilized to evaluate flow at rest and with distal augmentation maneuvers in the common  femoral, femoral and popliteal veins. COMPARISON:  None. FINDINGS: RIGHT LOWER EXTREMITY Common Femoral Vein: No evidence of thrombus. Normal compressibility, respiratory phasicity and response to augmentation. Saphenofemoral Junction: No evidence of thrombus. Normal compressibility and flow on color Doppler imaging. Profunda Femoral Vein: No evidence of thrombus. Normal compressibility and flow on color Doppler imaging. Femoral Vein: No evidence of thrombus. Normal compressibility, respiratory phasicity and response to augmentation. Popliteal Vein: No evidence of thrombus. Normal compressibility, respiratory phasicity and response to augmentation. Calf Veins: No evidence of thrombus. Normal compressibility and flow on color Doppler imaging. Superficial Great Saphenous Vein: No evidence of thrombus. Normal compressibility and flow on color Doppler imaging. Venous Reflux:  None. Other Findings:  None. LEFT LOWER EXTREMITY Common Femoral Vein: No evidence of thrombus. Normal compressibility, respiratory phasicity and response to augmentation. Saphenofemoral Junction: No evidence of thrombus. Normal compressibility and flow on color Doppler imaging. Profunda Femoral Vein: No evidence of thrombus. Normal compressibility and flow on color Doppler imaging. Femoral Vein: No evidence of thrombus. Normal compressibility, respiratory phasicity and response to augmentation. Popliteal Vein: No evidence of thrombus. Normal compressibility, respiratory phasicity and response to augmentation. Calf Veins: No evidence of thrombus. Normal compressibility and flow on color Doppler imaging. Superficial Great Saphenous Vein: No evidence of thrombus. Normal compressibility and flow on color Doppler imaging. Venous Reflux:  None. Other Findings:  None. IMPRESSION: No evidence of deep venous thrombosis. Electronically Signed   By: Logan Bores M.D.   On: 01/13/2017 10:20     ASSESSMENT AND PLAN:   61 year old female with past medical  history of recurrent breast cancer, history of pulmonary embolism, history of SVT, neuropathy, hypertension, hyperlipidemia-recent GI bleed who presented to the hospital due to tachycardia and dizziness and noted to have an acute pulmonary embolism.  1. Acute pulmonary embolism-this was noted on CT chest of admission. Dopplers of lower extremities are negative for DVT. -Currently on Lovenox. Patient has a recent GI bleed, high risk for anticoagulation. -Seen by hematology oncology and plan to switch over to Xarelto or Eliquis without a loading dose 1 tolerating Lovenox well. No acute bleeding presently. Shortness of breath and hypoxia has improved.  2. Elevated troponin-likely secondary supply demand ischemia from hypoxemia and the pulmonary embolism. -Noted some chest pain.  3. Tachycardia/SVT-patient has a history of atrial tachycardia. Seen by cardiology, ejection fraction normal on previous echocardiogram.  -Tachycardia exacerbated by hypokalemia. Potassium level is improved with supplementation. Started on Flecainide as per cardiology. - cont. Metoprolol.   4. Leukopenia - due to recent chemo.  - given Granix as per Hem/Onc and will follow counts.   5. Hypokalemia - given potassium supplements and will repeat level in a.m   6. Depression - cont. Cymbalta  7. Recurrent breast cancer-ER/PR positive HER-2/neu negative [status post liver biopsy February 2018; previously HER-2/neu positive]. currently on Eribulin. CTA showed improvement of liver lesions. Last received chemotherapy approximately 6 days ago. Patient again due for chemotherapy tomorrow which will hold at this time   All the records are reviewed and case discussed with Care Management/Social Worker. Management plans discussed with the patient, family and they are in agreement.  CODE STATUS: Full code  DVT Prophylaxis: Lovenox  TOTAL TIME TAKING CARE OF THIS PATIENT: 35 minutes.   POSSIBLE D/C IN  1-2 DAYS, DEPENDING ON  CLINICAL CONDITION.   Henreitta Leber M.D on 01/14/2017 at 4:59 PM  Between 7am to 6pm - Pager - [phone]  After 6pm go to [email or web address] - Proofreader  [facility]  Office  [phone]  CC: Primary care physician; Keith Rake, MD

## 2017-01-14 NOTE — Plan of Care (Signed)
Problem: Pain Managment: Goal: General experience of comfort will improve Outcome: Completed/Met Date Met: 01/14/17 Not complaints of pain this shift. Will continue to monitor,   Problem: Activity: Goal: Risk for activity intolerance will decrease Outcome: Completed/Met Date Met: 01/14/17 Up independently in the room tolerating well   

## 2017-01-14 NOTE — Progress Notes (Signed)
Pt placed on neutropenic precautions 2/2 WBC count of 0.6, MD aware, I will continue to assess.

## 2017-01-14 NOTE — Progress Notes (Addendum)
Patient Name: Sarah Horne      SUBJECTIVE: tired without chest pain  c/o headache  Relates stories of orthostatic lightheadedness -- not aware so much of palpitations.    Past Medical History:  Diagnosis Date  . Chemotherapy-induced peripheral neuropathy ([place])   . Epistaxis    a. 11/2016 in setting of asa/plavix-->silver nitrate cauterization.  . GI bleed    a. 11/2016 Admission w/ GIB and hypovolemic shock req 3u PRBC's;  b. 11/2016 ECG: gastritis & nonbleeding peptic ulcer; c. 11/2016 Conlonoscopy: rectal and sigmoid colonic ulcers.  . Heart attack ([place])    a. 1998 Cath @ [facility]: reportedly no intervention required.  Marland Kitchen Herceptin-induced cardiomyopathy ([place])    a. In the setting of Herceptin Rx for breast cancer (initiated 12/2014); b. 03/2015 MUGA EF 64%; b. 08/2015 MUGA: EF 51%; c. 10/2015 MUGA: EF 44%; d. 11/2015 Echo: EF 45-50%; e. 01/2016 MUGA: EF 60%; f. 06/2016 MUGA EF 65%; g. 10/2016 MUGA: EF 61%;  h. 12/2016 Echo: EF 55-60%, gr1 DD.  Marland Kitchen Hyperlipidemia   . Hypertension   . Neuropathy   . Possible PAD (peripheral artery disease) ([place])    a. 11/2016 LE cyanosis and weak pulses-->CTA w/o significant Ao-BiFem dzs. ? distal dzs-->ASA/Plavix initiated by vascular surgery.  Marland Kitchen PSVT (paroxysmal supraventricular tachycardia) ([place])    a. Dx 11/2016.  . Pulmonary embolism ([place])    a. 12/2016 CTA Chest: small nonocclusive PE in inferior segment of the Left lingula, somewhat eccentric filling defect suggesting chronic rather than acute embolic event; b. 05/5637 LE U/S:  No DVT; c. 12/2016 Echo: Nl RV fxn, nl PASP.  Marland Kitchen Recurrent Metastatic breast cancer ([place])    a. Dx 2016: Stage II, ER positive, PR positive, HER-2/neu overexpressing of the left breast-->chemo/radiation; b. 10/2016 CT Abd/pelvis: diffuse liver mets, ill defined sclerotic bone lesions-T12;  c. 10/2016 MRI brain: metastatic lesion along L temporal lobe (19x41m) w/ extensive surrounding edema & 567mmidline shift to right.  . Sinus  tachycardia     Scheduled Meds:  Scheduled Meds: . amoxicillin-clavulanate  1 tablet Oral Q12H  . anastrozole  1 mg Oral Daily  . aspirin EC  81 mg Oral Daily  . diltiazem  10 mg Intravenous Once  . DULoxetine  60 mg Oral Daily  . enoxaparin (LOVENOX) injection  1 mg/kg Subcutaneous Q12H  . feeding supplement (ENSURE ENLIVE)  237 mL Oral BID BM  . metoprolol tartrate  12.5 mg Oral BID  . pantoprazole  40 mg Oral BID  . polyethylene glycol  17 g Oral Daily  . potassium chloride SA  40 mEq Oral BID  . sodium chloride flush  10-40 mL Intracatheter Q12H  . sodium chloride flush  3 mL Intravenous Q12H  . sodium chloride flush  3 mL Intravenous Q12H   Continuous Infusions: . sodium chloride     sodium chloride, acetaminophen **OR** acetaminophen, diphenoxylate-atropine, HYDROcodone-acetaminophen, lidocaine, metoprolol, ondansetron **OR** ondansetron (ZOFRAN) IV, polyethylene glycol, sodium chloride flush, sodium chloride flush    PHYSICAL EXAM Vitals:   01/13/17 1321 01/13/17 2045 01/14/17 0416 01/14/17 0900  BP: (!) 101/59 105/64 107/62 107/63  Pulse: (!) 107 (!) 127 95 (!) 126  Resp: _0 Temp: 98.3 F (36.8 C) 98.1 F (36.7 C) 98.2 F (36.8 C) 99 F (37.2 C)  TempSrc: Oral Oral Oral Oral  SpO2: 100% 99% 96% 95%  Weight:   136 lb 1.6 oz (61.7 kg)   Height:  Well developed and nourished in no acute distress but looks very tired HENT normal Neck supple with JVP-flat Clear Regular rate and rhythm, no murmurs or gallops Abd-soft with active BS No Clubbing cyanosis edema Skin-warm and dry A & Oriented  Grossly normal sensory and motor function   TELEMETRY: Reviewed personnally pt in atrial tachycardia intermixed with sinus :  ECG personally reviewed* sinus  Intake/Output Summary (Last 24 hours) at 01/14/17 1234 Last data filed at 01/14/17 0416  Gross per 24 hour  Intake              590 ml  Output              100 ml  Net              490 ml     LABS: Basic Metabolic Panel:  Recent Labs Lab 01/11/17 1628 01/12/17 1514 01/13/17 0324 01/14/17 0610  NA 130* 135 134* 133*  K 3.3* 2.9* 2.8* 3.4*  CL 96* 100* 101 98*  CO2 _0 GLUCOSE 105* 151* 97 88  BUN _1 <5*  CREATININE 0.53 0.51 0.43* 0.40*  CALCIUM 8.3* 8.1* 7.5* 7.9*  MG  --   --  1.5* 2.0   Cardiac Enzymes:  Recent Labs  01/11/17 1958 01/11/17 2253 01/12/17 1514  CKTOTAL  --   --  69  TROPONINI 0.05* 0.05* 0.06*   CBC:  Recent Labs Lab 01/11/17 1628 01/12/17 1514 01/14/17 0610  WBC 3.5* 3.3* 0.6*  NEUTROABS 3.1  --   --   HGB 11.1* 10.2* 9.9*  HCT 33.2* 30.5* 30.2*  MCV 90.5 90.3 90.8  PLT 187 157 161   PROTIME: No results for input(s): LABPROT, INR in the last 72 hours. Liver Function Tests:  Recent Labs  01/11/17 1628 01/12/17 1514  AST 88* 85*  ALT 30 27  ALKPHOS 246* 217*  BILITOT 1.5* 1.5*  PROT 6.5 6.1*  ALBUMIN 2.5* 2.3*   No results for input(s): LIPASE, AMYLASE in the last 72 hours. BNP: BNP (last 3 results)  Recent Labs  01/12/17 1514  BNP 167.0*    ProBNP (last 3 results) No results for input(s): PROBNP in the last 8760 hours.  D-Dimer: No results for input(s): DDIMER in the last 72 hours. Hemoglobin A1C: No results for input(s): HGBA1C in the last 72 hours. Fasting Lipid Panel: No results for input(s): CHOL, HDL, LDLCALC, TRIG, CHOLHDL, LDLDIRECT in the last 72 hours. Thyroid Function Tests:  Recent Labs  01/12/17 1514  TSH 1.410       ASSESSMENT AND PLAN:  Active Problems:   Peripheral neuropathy due to chemotherapy (HCC)   Pulmonary emboli (HCC)   Orthostatic lightheadedness   Atrial tachycardia (HCC)   Hypokalemia  She is having significant LH which is orthostatic.  Her ability to take PO is a problem, but have encouraged this and will use compressive clothing.  Order abdominal binder.  Have also recommended raising the head of bed 4-6" to able offset particularly early AM  LH  Her atrial tach may aggravaate the low BP   With normal LVEF, and without  CAD--presumed-- would use flecainide.  I worry about the side effects of amio esp nausea  Why is she recurrently hypokalemic--- prob needs supplementation or aldactone      Signed, Virl Axe MD  01/14/2017

## 2017-01-15 ENCOUNTER — Inpatient Hospital Stay: Payer: BLUE CROSS/BLUE SHIELD

## 2017-01-15 DIAGNOSIS — Z170 Estrogen receptor positive status [ER+]: Secondary | ICD-10-CM | POA: Diagnosis not present

## 2017-01-15 DIAGNOSIS — R000 Tachycardia, unspecified: Secondary | ICD-10-CM | POA: Diagnosis not present

## 2017-01-15 DIAGNOSIS — C50919 Malignant neoplasm of unspecified site of unspecified female breast: Secondary | ICD-10-CM | POA: Diagnosis not present

## 2017-01-15 DIAGNOSIS — D709 Neutropenia, unspecified: Secondary | ICD-10-CM | POA: Diagnosis not present

## 2017-01-15 DIAGNOSIS — D72819 Decreased white blood cell count, unspecified: Secondary | ICD-10-CM | POA: Diagnosis not present

## 2017-01-15 DIAGNOSIS — C50412 Malignant neoplasm of upper-outer quadrant of left female breast: Secondary | ICD-10-CM | POA: Diagnosis not present

## 2017-01-15 DIAGNOSIS — I2699 Other pulmonary embolism without acute cor pulmonale: Secondary | ICD-10-CM | POA: Diagnosis not present

## 2017-01-15 DIAGNOSIS — E876 Hypokalemia: Secondary | ICD-10-CM | POA: Diagnosis not present

## 2017-01-15 DIAGNOSIS — I471 Supraventricular tachycardia: Secondary | ICD-10-CM | POA: Diagnosis not present

## 2017-01-15 DIAGNOSIS — C787 Secondary malignant neoplasm of liver and intrahepatic bile duct: Secondary | ICD-10-CM | POA: Diagnosis not present

## 2017-01-15 LAB — CBC
HEMATOCRIT: 29.4 % — AB (ref 35.0–47.0)
Hemoglobin: 9.7 g/dL — ABNORMAL LOW (ref 12.0–16.0)
MCH: 30.1 pg (ref 26.0–34.0)
MCHC: 32.9 g/dL (ref 32.0–36.0)
MCV: 91.5 fL (ref 80.0–100.0)
Platelets: 162 10*3/uL (ref 150–440)
RBC: 3.22 MIL/uL — ABNORMAL LOW (ref 3.80–5.20)
RDW: 23.7 % — AB (ref 11.5–14.5)
WBC: 0.6 10*3/uL — CL (ref 3.6–11.0)

## 2017-01-15 LAB — BASIC METABOLIC PANEL
Anion gap: 6 (ref 5–15)
BUN: 7 mg/dL (ref 6–20)
CHLORIDE: 102 mmol/L (ref 101–111)
CO2: 28 mmol/L (ref 22–32)
Calcium: 8.1 mg/dL — ABNORMAL LOW (ref 8.9–10.3)
Creatinine, Ser: 0.67 mg/dL (ref 0.44–1.00)
GFR calc Af Amer: >60 mL/min (ref >60)
GFR calc non Af Amer: >60 mL/min (ref >60)
GLUCOSE: 95 mg/dL (ref 65–99)
POTASSIUM: 3.7 mmol/L (ref 3.5–5.1)
Sodium: 136 mmol/L (ref 135–145)

## 2017-01-15 NOTE — Progress Notes (Signed)
Physical Therapy Evaluation Patient Details Name: Sarah Horne MRN: [id number] DOB: [birth year] Today's Date: 01/15/2017   History of Present Illness  Sarah Horne  is a 61 y.o. female with a known history of hypertension, cardiomyopathy, stage IV breast cancer with liver and brain metastases presents to the emergency room sent in by her home health care nurse due to tachycardia into the 140s. A call was made to her cardiology office who suggested coming to the emergency room. Patient was seen yesterday for weakness found to be tachycardic and discharged home after hydration. Today she had a CT scan of the chest which showed left lower lobe segmental pulmonary embolism. She has had no lower extremity swelling. No recent surgeries. She did have 2 recent hospital stays in February and March. No bleeding. No shortness of breath or palpitations. Did feel lightheaded and dizzy but at time of PT evaluation denied. She is now admitted for acute PE, hypokalemia, and tachycardia.   Clinical Impression  Pt admitted with above diagnosis. Pt currently with functional limitations due to the deficits listed below (see PT Problem List).  Pt demonstrates mobility which is close to her baseline per subjective reports. However her baseline mobility is somewhat unsafe with 3 falls reported over the last 6 months. Pt demonstrates difficulty with balance with eyes closed and in single leg stance. She is safe to return home but should resume [place] PT for balance and strengthening. Pt will benefit from skilled PT services to address deficits in strength, balance, and mobility in order to return to full function at home.     Follow Up Recommendations Home health PT;Other (comment) (Resume HH PT for balance/strength)    Equipment Recommendations  None recommended by PT    Recommendations for Other Services       Precautions / Restrictions Precautions Precautions: None Restrictions Weight Bearing Restrictions: No       Mobility  Bed Mobility Overal bed mobility: Independent             General bed mobility comments: Good speed/sequencing noted  Transfers Overall transfer level: Needs assistance Equipment used: Rolling walker (2 wheeled) Transfers: Sit to/from Stand Sit to Stand: Supervision         General transfer comment: Pt demonstrates fair speed/sequencing. She is stable upright in wide stance with and without UE support  Ambulation/Gait Ambulation/Gait assistance: Min guard Ambulation Distance (Feet): 150 Feet Assistive device: Rolling walker (2 wheeled) Gait Pattern/deviations: WFL(Within Functional Limits) Gait velocity: Adequate for limited community mobility   General Gait Details: Pt demonstrates good stability when ambulating with rolling walker. Pt denies DOE during ambulation. She is able to turn her head horizontally to have a conversation without significant gait deviation noted.   Stairs            Wheelchair Mobility    Modified Rankin (Stroke Patients Only)       Balance Overall balance assessment: Needs assistance Sitting-balance support: No upper extremity supported Sitting balance-Leahy Scale: Good     Standing balance support: No upper extremity supported Standing balance-Leahy Scale: Fair Standing balance comment: Pt able to maintain feet apart and together balance without UE support. Positive [place] within 2-3 seconds of closing her eyes. Single leg balance is approximately 2 seconds bilaterally                             Pertinent Vitals/Pain Pain Assessment: 0-10 Pain Score: 2  Pain Location: Bilateral foot  pain, chronic secondary to neuropathy Pain Intervention(s): Monitored during session    [place] expects to be discharged to:: Private residence Living Arrangements: Alone Available Help at Discharge: Family (Brother) Type of Home: House Home Access: Stairs to enter Entrance Stairs-Rails:  None Entrance Stairs-Number of Steps: 3 Home Layout: One level Home Equipment: Environmental consultant - 2 wheels;Bedside commode;Shower seat      Prior Function Level of Independence: Independent with assistive device(s)         Comments: PRN use of rolling walker. Mostly stays at home. Pt reports 3 falls in last 6 months (1 at home; 1 in community; and 1 in hospital).      Hand Dominance   Dominant Hand: Right    Extremity/Trunk Assessment   Upper Extremity Assessment Upper Extremity Assessment: Overall WFL for tasks assessed    Lower Extremity Assessment Lower Extremity Assessment: RLE deficits/detail RLE Deficits / Details: Overall WFL with the exception of R ankle DF weakness. Pt previously unaware of this weakness. History of bilateral LE neuropathy       Communication   Communication: No difficulties  Cognition Arousal/Alertness: Awake/alert Behavior During Therapy: WFL for tasks assessed/performed Overall Cognitive Status: Within Functional Limits for tasks assessed                                        General Comments      Exercises     Assessment/Plan    PT Assessment Patient needs continued PT services  PT Problem List Decreased strength;Decreased activity tolerance;Decreased balance;Cardiopulmonary status limiting activity       PT Treatment Interventions DME instruction;Gait training;Stair training;Functional mobility training;Therapeutic activities;Therapeutic exercise;Neuromuscular re-education;Balance training;Patient/family education    PT Goals (Current goals can be found in the Care Plan section)  Acute Rehab PT Goals Patient Stated Goal: Return home PT Goal Formulation: With patient Time For Goal Achievement: 01/29/17 Potential to Achieve Goals: Good    Frequency Min 2X/week   Barriers to discharge Decreased caregiver support Lives alone    Co-evaluation               End of Session Equipment Utilized During Treatment:  Gait belt Activity Tolerance: Patient tolerated treatment well Patient left: in bed;with call bell/phone within reach Nurse Communication: Mobility status PT Visit Diagnosis: Repeated falls (R29.6);Unsteadiness on feet (R26.81)    Time: 2458-0998 PT Time Calculation (min) (ACUTE ONLY): 17 min   Charges:   PT Evaluation $PT Eval Low Complexity: 1 Procedure     PT G Codes:   PT G-Codes **NOT FOR INPATIENT CLASS** Functional Assessment Tool Used: AM-PAC 6 Clicks Basic Mobility Functional Limitation: Mobility: Walking and moving around Mobility: Walking and Moving Around Current Status (P3825): At least 1 percent but less than 20 percent impaired, limited or restricted Mobility: Walking and Moving Around Goal Status [phone]): At least 1 percent but less than 20 percent impaired, limited or restricted   Phillips Grout PT, DPT    Sarah Horne 01/15/2017, 11:53 AM

## 2017-01-15 NOTE — Progress Notes (Signed)
[facility] NAME: Sarah Horne    MR#:  [id number]  DATE OF BIRTH:  07-24-1956  SUBJECTIVE:   She is here due to tachycardia, dizziness and noted to have a left-sided lingular pulmonary embolism. Remains slightly tachycardic but improved.  Remains Neutropenic.    REVIEW OF SYSTEMS:    Review of Systems  Constitutional: Negative for chills and fever.  HENT: Negative for congestion and tinnitus.   Eyes: Negative for blurred vision and double vision.  Respiratory: Positive for shortness of breath. Negative for cough and wheezing.   Cardiovascular: Negative for chest pain, orthopnea and PND.  Gastrointestinal: Negative for abdominal pain, diarrhea, nausea and vomiting.  Genitourinary: Negative for dysuria and hematuria.  Neurological: Negative for dizziness, sensory change and focal weakness.  All other systems reviewed and are negative.   Nutrition: Heart Healthy Tolerating Diet: Yes Tolerating PT: Eval noted.   DRUG ALLERGIES:   Allergies  Allergen Reactions  . No Known Allergies     VITALS:  Blood pressure 99/63, pulse (!) 108, temperature 98.2 F (36.8 C), temperature source Oral, resp. rate 16, height _0  (1.676 m), weight 61.7 kg (136 lb 1.6 oz), SpO2 100 %.  PHYSICAL EXAMINATION:   Physical Exam  GENERAL:  61 y.o.-year-old patient lying in bed in NAD.   EYES: Pupils equal, round, reactive to light. No scleral icterus. Extraocular muscles intact.  HEENT: Head atraumatic, normocephalic. Oropharynx and nasopharynx clear.  NECK:  Supple, no jugular venous distention. No thyroid enlargement, no tenderness.  LUNGS: Normal breath sounds bilaterally, no wheezing, rales, rhonchi. No use of accessory muscles of respiration.  CARDIOVASCULAR: S1, S2 normal. No murmurs, rubs, or gallops.  ABDOMEN: Soft, nontender, nondistended. Bowel sounds present. No organomegaly or mass.  EXTREMITIES: No cyanosis, clubbing or edema b/l.     NEUROLOGIC: Cranial nerves II through XII are intact. No focal Motor or sensory deficits b/l.  Globally weak. PSYCHIATRIC: The patient is alert and oriented x 3.  SKIN: No obvious rash, lesion, or ulcer.    LABORATORY PANEL:   CBC  Recent Labs Lab 01/15/17 0457  WBC 0.6*  HGB 9.7*  HCT 29.4*  PLT 162   ------------------------------------------------------------------------------------------------------------------  Chemistries   Recent Labs Lab 01/12/17 1514  01/14/17 0610 01/15/17 0457  NA 135  < > 133* 136  K 2.9*  < > 3.4* 3.7  CL 100*  < > 98* 102  CO2 27  < > 28 28  GLUCOSE 151*  < > 88 95  BUN 9  < > <5* 7  CREATININE 0.51  < > 0.40* 0.67  CALCIUM 8.1*  < > 7.9* 8.1*  MG  --   < > 2.0  --   AST 85*  --   --   --   ALT 27  --   --   --   ALKPHOS 217*  --   --   --   BILITOT 1.5*  --   --   --   < > = values in this interval not displayed. ------------------------------------------------------------------------------------------------------------------  Cardiac Enzymes  Recent Labs Lab 01/12/17 1514  TROPONINI 0.06*   ------------------------------------------------------------------------------------------------------------------  RADIOLOGY:  No results found.   ASSESSMENT AND PLAN:   61 year old female with past medical history of recurrent breast cancer, history of pulmonary embolism, history of SVT, neuropathy, hypertension, hyperlipidemia-recent GI bleed who presented to the hospital due to tachycardia and dizziness and noted to have an acute pulmonary embolism.  1. Acute pulmonary embolism-this was noted on CT chest of admission. Dopplers of lower extremities are negative for DVT. -Currently on Lovenox. Patient has a recent GI bleed, high risk for anticoagulation. -Seen by hematology oncology and discussed w/ Dr. Rogue Bussing and will d/c on Low dose Xarelto upon discharge.  Cont. Lovenox for now and no acute bleeding.   2. Elevated  troponin-likely secondary supply demand ischemia from hypoxemia and the pulmonary embolism. - No chest pain.  3. Tachycardia/SVT-patient has a history of atrial tachycardia. Seen by cardiology, ejection fraction normal on previous echocardiogram.  -Tachycardia exacerbated by hypokalemia. Potassium level is improved with supplementation. Started on Flecainide as pt. Was orthostatic and cannot up titrate Metoprolol. Cont. Further care as per cardiology. - cont. Metoprolol.   4. Leukopenia - due to recent chemo.  Remains Neutropenic.  - given Granix yesterday and another dose today. Follow counts.   5. Hypokalemia - improved w/ supplementation.    6. Depression - cont. Cymbalta  7. Recurrent breast cancer-ER/PR positive HER-2/neu negative [status post liver biopsy February 2018; previously HER-2/neu positive]. currently on Eribulin. CTA showed improvement of liver lesions. Last received chemotherapy approximately 6 days ago. Patient again due for chemotherapy tomorrow which will hold at this time  Likely d/c home tomorrow if Neutropenia improved.   All the records are reviewed and case discussed with Care Management/Social Worker. Management plans discussed with the patient, family and they are in agreement.  CODE STATUS: Full code  DVT Prophylaxis: Lovenox  TOTAL TIME TAKING CARE OF THIS PATIENT: 30 minutes.   POSSIBLE D/C IN 1-2 DAYS, DEPENDING ON CLINICAL CONDITION.   Henreitta Leber M.D on 01/15/2017 at 3:18 PM  Between 7am to 6pm - Pager - [phone]  After 6pm go to [email or web address] - Proofreader  [facility]  Office  [phone]  CC: Primary care physician; Keith Rake, MD

## 2017-01-15 NOTE — Progress Notes (Signed)
Sarah Horne   DOB:May 01, 1956   YF#:[id number]    Subjective: Patient resting in the bed. Comfortably. Denies any fevers. Denies any chills.  NO palpitations/ or chest pain.   Objective:  Vitals:   01/15/17 1012 01/15/17 1206  BP: 96/63 99/63  Pulse: (!) 111 (!) 108  Resp:  16  Temp:  98.2 F (36.8 C)     Intake/Output Summary (Last 24 hours) at 01/15/17 1728 Last data filed at 01/15/17 1525  Gross per 24 hour  Intake               10 ml  Output              500 ml  Net             -490 ml    GENERAL: Well-nourished well-developed; Alert, no distress and comfortable.   Alone.  EYES: no pallor or icterus OROPHARYNX: no thrush. So in the left upper lip. NECK: supple, no masses felt LYMPH:  no palpable lymphadenopathy in the cervical, axillary or inguinal regions LUNGS: decreased breath sounds to auscultation at bases and  No wheeze or crackles HEART/CVS: regular rate & rhythm and no murmurs; No lower extremity edema ABDOMEN: abdomen soft, non-tender and normal bowel sounds Musculoskeletal:no cyanosis of digits and no clubbing  PSYCH: alert & oriented x 3 with fluent speech NEURO: no focal motor/sensory deficits SKIN:  no rashes or significant lesions   Labs:  Lab Results  Component Value Date   WBC 0.6 (LL) 01/15/2017   HGB 9.7 (L) 01/15/2017   HCT 29.4 (L) 01/15/2017   MCV 91.5 01/15/2017   PLT 162 01/15/2017   NEUTROABS 3.1 01/11/2017    Lab Results  Component Value Date   NA 136 01/15/2017   K 3.7 01/15/2017   CL 102 01/15/2017   CO2 28 01/15/2017    Studies:  No results found.  Assessment & Plan:  61 year old female patient with metastatic breast cancer currently on palliative treatment admitted to the hospital for tachycardia/dizziness; CTA shows-left lingular PE  # RECURRENT Metastatic breast cancer- ER/PR positive HER-2/neu negative [status post liver biopsy February 2018; previously HER-2/neu positive]. currently on Eribulin. CTA showed improvement  of liver lesions. Last received chemotherapy approximately 7 days ago. HOLD chemo for now.   # Severe neutropenia- white count 0.6. On granix. Recommend granix until WBC > 1.5. Pt can be discharged when WBC >1.5.   # Small lingular PE appears chronic/no active lower extremity venous clots- continue Lovenox 44m/kg dose; and at discharge recommend xarelto 133monce a day [prophylactic dose/as patient is at high risk for DVT/PE given her active malignancy].  # Tachycardia question related to arrhythmia /severe hypokalemia. Stable/ on flecainide.   # Discussed with Dr. SaVerdell CarmineDr.Finnegan on call over weekend-if needed.   GoCammie SickleMD 01/15/2017  5:28 PM

## 2017-01-15 NOTE — Progress Notes (Signed)
[place] for electrolyte management  Pharmacy consulted for electrolyte management for 61 yo female admitted with PE. Patient received 4 g of Mg on 4/18. Patient currently ordered potassium 85mq PO BID.  Assessment: 4/19 0600 K 3.4 4/19 0600 Mg 2.0 4/20 0457 K 3.7, Ca 8.1, last albumin 4/17 2.3, adjusted Ca 9.5, Mg not assessed, phos not assessed.   Plan: Continue potassium chloride 40 mEq oral BID and recheck all electrolytes tomorrow with AM labs.   Allergies  Allergen Reactions  . No Known Allergies     Patient Measurements: Height: '5\' 6"'$  (167.6 cm) Weight: 136 lb 1.6 oz (61.7 kg) IBW/kg (Calculated) : 59.3 Vital Signs: Temp: 98.9 F (37.2 C) (04/20 0500) Temp Source: Oral (04/20 0500) BP: 115/65 (04/20 0500) Pulse Rate: 115 (04/20 0500) Intake/Output from previous day: 04/19 0701 - 04/20 0700 In: 60 [P.O.:50; I.V.:10] Out: 0  Intake/Output from this shift: No intake/output data recorded.  Labs:  Recent Labs  01/12/17 1514 01/13/17 0324 01/14/17 0610 01/15/17 0457  WBC 3.3*  --  0.6* 0.6*  HGB 10.2*  --  9.9* 9.7*  HCT 30.5*  --  30.2* 29.4*  PLT 157  --  161 162  CREATININE 0.51 0.43* 0.40* 0.67  MG  --  1.5* 2.0  --   ALBUMIN 2.3*  --   --   --   PROT 6.1*  --   --   --   AST 85*  --   --   --   ALT 27  --   --   --   ALKPHOS 217*  --   --   --   BILITOT 1.5*  --   --   --   BILIDIR 0.4  --   --   --   IBILI 1.1*  --   --   --    Estimated Creatinine Clearance: 67.4 mL/min (by C-G formula based on SCr of 0.67 mg/dL).    NLaural Benes Pharm.D., BCPS Clinical Pharmacist 01/15/2017,7:35 AM

## 2017-01-15 NOTE — Progress Notes (Signed)
Progress Note  Patient Name: Sarah Horne Date of Encounter: 01/15/2017  Primary Cardiologist: Dr. Fletcher Anon  Subjective   No chest pain or palpitations. Breathing is at baseline. No orthopnea or lower extremity edema.   Inpatient Medications    Scheduled Meds: . amoxicillin-clavulanate  1 tablet Oral Q12H  . anastrozole  1 mg Oral Daily  . aspirin EC  81 mg Oral Daily  . diltiazem  10 mg Intravenous Once  . DULoxetine  60 mg Oral Daily  . enoxaparin (LOVENOX) injection  1 mg/kg Subcutaneous Q24H  . feeding supplement (ENSURE ENLIVE)  237 mL Oral BID BM  . flecainide  50 mg Oral Q12H  . metoprolol tartrate  12.5 mg Oral BID  . pantoprazole  40 mg Oral BID  . polyethylene glycol  17 g Oral Daily  . potassium chloride SA  40 mEq Oral BID  . sodium chloride flush  10-40 mL Intracatheter Q12H  . sodium chloride flush  3 mL Intravenous Q12H  . sodium chloride flush  3 mL Intravenous Q12H  . Tbo-filgastrim (GRANIX) SQ  480 mcg Subcutaneous Daily   Continuous Infusions: . sodium chloride     PRN Meds: sodium chloride, acetaminophen **OR** acetaminophen, diphenoxylate-atropine, HYDROcodone-acetaminophen, lidocaine, metoprolol, ondansetron **OR** ondansetron (ZOFRAN) IV, polyethylene glycol, sodium chloride flush, sodium chloride flush   Vital Signs    Vitals:   01/14/17 2211 01/15/17 0500 01/15/17 0800 01/15/17 1012  BP: 101/60 115/65 105/64 96/63  Pulse:  (!) 115 98 (!) 111  Resp:  18 20   Temp:  98.9 F (37.2 C) 98.7 F (37.1 C)   TempSrc:  Oral Oral   SpO2:  96% 99%   Weight:      Height:        Intake/Output Summary (Last 24 hours) at 01/15/17 1045 Last data filed at 01/15/17 1027  Gross per 24 hour  Intake               10 ml  Output              300 ml  Net             -290 ml   Filed Weights   01/12/17 1413 01/13/17 0505 01/14/17 0416  Weight: 150 lb (68 kg) 136 lb 4.8 oz (61.8 kg) 136 lb 1.6 oz (61.7 kg)    Telemetry    Sinus tachycardia, HR in  110's. Episode of SVT yesterday afternoon with HR in the 150's, resolving within 30 minutes. - Personally Reviewed  ECG    No new tracings.   Physical Exam   General: Well developed, African American female appearing in no acute distress. Head: Normocephalic, atraumatic.  Neck: Supple without bruits, JVD not elevated. Lungs:  Resp regular and unlabored, CTA without wheezing or rales. Heart: RRR, S1, S2, no S3, S4, or murmur; no rub. Abdomen: Soft, non-tender, non-distended with normoactive bowel sounds. No hepatomegaly. No rebound/guarding. No obvious abdominal masses. Extremities: No clubbing, cyanosis, or lower extremity edema. Distal pedal pulses are 2+ bilaterally. Neuro: Alert and oriented X 3. Moves all extremities spontaneously. Psych: Normal affect.  Labs    Chemistry Recent Labs Lab 01/11/17 1628 01/12/17 1514 01/13/17 0324 01/14/17 0610 01/15/17 0457  NA 130* 135 134* 133* 136  K 3.3* 2.9* 2.8* 3.4* 3.7  CL 96* 100* 101 98* 102  CO2 '26 27 26 28 28  '$ GLUCOSE 105* 151* 97 88 95  BUN '8 9 7 '$ <5* 7  CREATININE 0.53 0.51  0.43* 0.40* 0.67  CALCIUM 8.3* 8.1* 7.5* 7.9* 8.1*  PROT 6.5 6.1*  --   --   --   ALBUMIN 2.5* 2.3*  --   --   --   AST 88* 85*  --   --   --   ALT 30 27  --   --   --   ALKPHOS 246* 217*  --   --   --   BILITOT 1.5* 1.5*  --   --   --   GFRNONAA >60 >60 >60 >60 >60  GFRAA >60 >60 >60 >60 >60  ANIONGAP '8 8 7 7 6     '$ Hematology Recent Labs Lab 01/12/17 1514 01/14/17 0610 01/15/17 0457  WBC 3.3* 0.6* 0.6*  RBC 3.38* 3.33* 3.22*  HGB 10.2* 9.9* 9.7*  HCT 30.5* 30.2* 29.4*  MCV 90.3 90.8 91.5  MCH 30.2 29.8 30.1  MCHC 33.5 32.8 32.9  RDW 24.0* 23.6* 23.7*  PLT 157 161 162    Cardiac Enzymes Recent Labs Lab 01/11/17 1628 01/11/17 1958 01/11/17 2253 01/12/17 1514  TROPONINI 0.04* 0.05* 0.05* 0.06*   No results for input(s): TROPIPOC in the last 168 hours.   BNP Recent Labs Lab 01/12/17 1514  BNP 167.0*     DDimer No results  for input(s): DDIMER in the last 168 hours.   Radiology    No results found.  Cardiac Studies   Echocardiogram: 01/13/2017 Study Conclusions  - Left ventricle: The cavity size was normal. Systolic function was   normal. The estimated ejection fraction was in the range of 55%   to 60%. Wall motion was normal; there were no regional wall   motion abnormalities. Doppler parameters are consistent with   abnormal left ventricular relaxation (grade 1 diastolic   dysfunction). - Left atrium: The atrium was normal in size. - Right ventricle: Systolic function was normal. - Pulmonary arteries: Systolic pressure was within the normal   range.  Patient Profile     61 y.o. female w/ PMH of recurrent metastatic breast cancer with liver and brain mets, herceptin induced cardiomyopathy, HTN, HLD, sinus tachycardia, pSVT, prior GIB, PUD, gastritis, and ? PAD who presented to [facility] on 01/12/2017 for fatigue and hypotension.   Assessment & Plan    1.Paroxysmal SVT/ Orthostatic Hypotension - Pt with recurrent SVT in the setting of admission for hypotension, sinus tachycardia, hypokalemia, and finding of L sided PE. She has been asymptomatic on both occasions, though she was noted to be hypotensive. Was on Lopressor 12.5 BID PTA, which may be exacerbating her baseline hypotension. - evaluated by Dr. Caryl Comes with EP on 4/19 and noted to be significantly orthostatic, therefore an abdominal binder was ordered. Flecainide was initiated at '50mg'$  BID in the setting of her SVT and atrial tachycardia. Would recommend a stress test in 2 weeks following initiation of this.   2. Left Pulmonary Embolus - Noted on CTA this admission. Suspicion for chronic PE. Difficult situation with regards to anticoagulation in the setting of recent GIB in 11/2016, epistaxis, and ongoing chemotherapy. - Given non-occlusive nature of PE, nl RV fxn, and absence of LE DVT, may wish to defer [place] at this time and consider role of IVC  filter or conservative mgmt.   - per review of notes, she has been receiving Lovenox and plans are to switch to Xarelto or Eliquis today per Heme/Onc.   3. Elevated troponin - cyclic values remained flat at 0.04, 0.05, 0.05, and 0.06. Non-specific in setting of above.  No significant R heart strain on echo. Nl LVEF by echo.  Given comorbidities, would not pursue further ischemic evaluation at this time.   4. Hypokalemia - resolved, as K+ is at 3.7 today.   5. Recent GIB/Normocytic anemia - H/H stable at 9.7/29.4.  6. Herceptin-Induced Cardiomyopathy - EF normal by echo this admission.   7.  LFT abnormalities - Hepatic Function Panel on 4/17 showed AST of 85 and ALT at 27. - would avoid the use of Amiodarone with abnormal LFT's.   8. Questionable PAD - noted to have a cold lower extremity on admission in March.  No convincing evidence of PAD. Had been on ASA and Plavix prior to admission. Plavix held in setting of plans to start a NOAC as above.   9. Recurrent metastatic breast cancer with brain & liver mets - Per heme/onc.  Signed, Erma Heritage , PA-C 10:45 AM 01/15/2017 Pager: [phone]

## 2017-01-15 NOTE — Progress Notes (Signed)
I have reviewed the assessment by the Student RN, Vicente Males Apple, and agree with or have altered her findings

## 2017-01-16 DIAGNOSIS — I2699 Other pulmonary embolism without acute cor pulmonale: Secondary | ICD-10-CM | POA: Diagnosis not present

## 2017-01-16 DIAGNOSIS — C50919 Malignant neoplasm of unspecified site of unspecified female breast: Secondary | ICD-10-CM | POA: Diagnosis not present

## 2017-01-16 DIAGNOSIS — D72819 Decreased white blood cell count, unspecified: Secondary | ICD-10-CM | POA: Diagnosis not present

## 2017-01-16 DIAGNOSIS — R000 Tachycardia, unspecified: Secondary | ICD-10-CM | POA: Diagnosis not present

## 2017-01-16 LAB — BASIC METABOLIC PANEL
ANION GAP: 6 (ref 5–15)
BUN: 7 mg/dL (ref 6–20)
CO2: 29 mmol/L (ref 22–32)
Calcium: 8.1 mg/dL — ABNORMAL LOW (ref 8.9–10.3)
Chloride: 100 mmol/L — ABNORMAL LOW (ref 101–111)
Creatinine, Ser: 0.55 mg/dL (ref 0.44–1.00)
GLUCOSE: 70 mg/dL (ref 65–99)
Potassium: 3.8 mmol/L (ref 3.5–5.1)
SODIUM: 135 mmol/L (ref 135–145)

## 2017-01-16 LAB — CBC
HCT: 28.1 % — ABNORMAL LOW (ref 35.0–47.0)
Hemoglobin: 9.2 g/dL — ABNORMAL LOW (ref 12.0–16.0)
MCH: 29.8 pg (ref 26.0–34.0)
MCHC: 32.9 g/dL (ref 32.0–36.0)
MCV: 90.6 fL (ref 80.0–100.0)
PLATELETS: 162 10*3/uL (ref 150–440)
RBC: 3.1 MIL/uL — ABNORMAL LOW (ref 3.80–5.20)
RDW: 23.7 % — AB (ref 11.5–14.5)
WBC: 1.2 10*3/uL — AB (ref 3.6–11.0)

## 2017-01-16 LAB — PHOSPHORUS: Phosphorus: 2.6 mg/dL (ref 2.5–4.6)

## 2017-01-16 LAB — MAGNESIUM: MAGNESIUM: 1.5 mg/dL — AB (ref 1.7–2.4)

## 2017-01-16 MED ORDER — HEPARIN SOD (PORK) LOCK FLUSH 100 UNIT/ML IV SOLN
500.0000 [IU] | Freq: Once | INTRAVENOUS | Status: AC
  Administered 2017-01-16: 500 [IU]
  Filled 2017-01-16: qty 5

## 2017-01-16 MED ORDER — RIVAROXABAN 10 MG PO TABS: 10.0000 mg | ORAL_TABLET | Freq: Every day | ORAL | 1 refills | Status: DC

## 2017-01-16 MED ORDER — FLECAINIDE ACETATE 50 MG PO TABS: 50.0000 mg | ORAL_TABLET | Freq: Two times a day (BID) | ORAL | 1 refills | Status: DC

## 2017-01-16 MED ORDER — FILGRASTIM 480 MCG/1.6ML IJ SOLN
480.0000 ug | Freq: Every day | INTRAMUSCULAR | Status: DC
  Administered 2017-01-16: 480 ug via SUBCUTANEOUS
  Filled 2017-01-16: qty 1.6

## 2017-01-16 MED ORDER — MAGNESIUM SULFATE 2 GM/50ML IV SOLN
2.0000 g | Freq: Once | INTRAVENOUS | Status: DC
  Filled 2017-01-16: qty 50

## 2017-01-16 NOTE — Progress Notes (Signed)
[place] for electrolyte management  Pharmacy consulted for electrolyte management for 61 yo female admitted with PE. Patient received 4 g of Mg on 4/18. Patient currently ordered potassium 80mq PO BID.  Assessment: 4/19 0600 K 3.4 4/19 0600 Mg 2.0 4/20 0457 K 3.7, Ca 8.1, last albumin 4/17 2.3, adjusted Ca 9.5, Mg not assessed, phos not assessed.   Plan: Continue potassium chloride 40 mEq oral BID and recheck all electrolytes tomorrow with AM labs.   Allergies  Allergen Reactions  . No Known Allergies     Patient Measurements: Height: '5\' 6"'$  (167.6 cm) Weight: 134 lb 9.6 oz (61.1 kg) IBW/kg (Calculated) : 59.3 Vital Signs: Temp: 98.2 F (36.8 C) (04/21 0311) Temp Source: Oral (04/21 0311) BP: 95/59 (04/21 0539) Pulse Rate: 84 (04/21 0539) Intake/Output from previous day: 04/20 0701 - 04/21 0700 In: -  Out: 500 [Urine:500] Intake/Output from this shift: No intake/output data recorded.  Labs:  Recent Labs  01/14/17 0610 01/15/17 0457 01/16/17 0551  WBC 0.6* 0.6*  --   HGB 9.9* 9.7*  --   HCT 30.2* 29.4*  --   PLT 161 162  --   CREATININE 0.40* 0.67 0.55  MG 2.0  --  1.5*  PHOS  --   --  2.6   Estimated Creatinine Clearance: 67.4 mL/min (by C-G formula based on SCr of 0.55 mg/dL).  4/21 AM Mg 1.5. 2 grams magnesium sulfate IV x1 ordered. Recheck electrolytes tomorrow AM.   Balbina Depace S, Pharm.D., BCPS Clinical Pharmacist 01/16/2017,6:40 AM

## 2017-01-16 NOTE — Care Management Note (Signed)
Case Management Note  Patient Details  Name: Sarah Horne MRN: [id number] Date of Birth: 1956/01/28  Subjective/Objective:       Call to Melene Muller, [facility] Liaison, with referral for resumption of care for HH-PT and RN home health services.            Action/Plan:   Expected Discharge Date:  01/16/17               Expected Discharge Plan:     In-House Referral:     Discharge planning Services     Post Acute Care Choice:    Choice offered to:     DME Arranged:    DME Agency:     HH Arranged:    HH Agency:     Status of Service:     If discussed at [employer], dates discussed:    Additional Comments:  Yicel Shannon A, RN 01/16/2017, 10:37 AM

## 2017-01-16 NOTE — Plan of Care (Signed)
Problem: Nutrition: Goal: Adequate nutrition will be maintained Outcome: Not Progressing Poor po intake continues. No complaints of N/V.  HR withing normal limits after meds given.

## 2017-01-16 NOTE — Discharge Summary (Signed)
[facility] [place] NAME: Sarah Horne    MR#:  [id number]  DATE OF BIRTH:  12/23/1955  DATE OF ADMISSION:  01/12/2017 ADMITTING PHYSICIAN: Hillary Bow, MD  DATE OF DISCHARGE: 01/16/2017  1:45 PM  PRIMARY CARE PHYSICIAN: Keith Rake, MD    ADMISSION DIAGNOSIS:  Pulmonary emboli (HCC) [I26.99] Other chronic pulmonary embolism without acute cor pulmonale (HCC) [I27.82]  DISCHARGE DIAGNOSIS:  Active Problems:   Peripheral neuropathy due to chemotherapy (HCC)   Pulmonary emboli (HCC)   Orthostatic lightheadedness   Atrial tachycardia (HCC)   Hypokalemia   SECONDARY DIAGNOSIS:   Past Medical History:  Diagnosis Date  . Chemotherapy-induced peripheral neuropathy ([place])   . Epistaxis    a. 11/2016 in setting of asa/plavix-->silver nitrate cauterization.  . GI bleed    a. 11/2016 Admission w/ GIB and hypovolemic shock req 3u PRBC's;  b. 11/2016 ECG: gastritis & nonbleeding peptic ulcer; c. 11/2016 Conlonoscopy: rectal and sigmoid colonic ulcers.  . Heart attack ([place])    a. 1998 Cath @ [facility]: reportedly no intervention required.  Marland Kitchen Herceptin-induced cardiomyopathy ([place])    a. In the setting of Herceptin Rx for breast cancer (initiated 12/2014); b. 03/2015 MUGA EF 64%; b. 08/2015 MUGA: EF 51%; c. 10/2015 MUGA: EF 44%; d. 11/2015 Echo: EF 45-50%; e. 01/2016 MUGA: EF 60%; f. 06/2016 MUGA EF 65%; g. 10/2016 MUGA: EF 61%;  h. 12/2016 Echo: EF 55-60%, gr1 DD.  Marland Kitchen Hyperlipidemia   . Hypertension   . Neuropathy   . Possible PAD (peripheral artery disease) ([place])    a. 11/2016 LE cyanosis and weak pulses-->CTA w/o significant Ao-BiFem dzs. ? distal dzs-->ASA/Plavix initiated by vascular surgery.  Marland Kitchen PSVT (paroxysmal supraventricular tachycardia) ([place])    a. Dx 11/2016.  . Pulmonary embolism ([place])    a. 12/2016 CTA Chest: small nonocclusive PE in inferior segment of the Left lingula, somewhat eccentric filling defect suggesting chronic rather than acute embolic  event; b. 09/7492 LE U/S:  No DVT; c. 12/2016 Echo: Nl RV fxn, nl PASP.  Marland Kitchen Recurrent Metastatic breast cancer ([place])    a. Dx 2016: Stage II, ER positive, PR positive, HER-2/neu overexpressing of the left breast-->chemo/radiation; b. 10/2016 CT Abd/pelvis: diffuse liver mets, ill defined sclerotic bone lesions-T12;  c. 10/2016 MRI brain: metastatic lesion along L temporal lobe (19x27m) w/ extensive surrounding edema & 574mmidline shift to right.  . Sinus tachycardia     HOSPITAL COURSE:   [age]ear old female with past medical history of recurrent breast cancer, history of pulmonary embolism, history of SVT, neuropathy, hypertension, hyperlipidemia-recent GI bleed who presented to the hospital due to tachycardia and dizziness and noted to have an acute pulmonary embolism.  1. Acute pulmonary embolism-this was noted on CT chest of admission. Dopplers of lower extremities were negative for DVT. -Given her recent history of GI bleed patient was placed on Lovenox daily for anticoagulation. She tolerated it well. No evidence of acute bleeding while on anticoagulation in the hospital. CBC was seen by her oncologist recommended low dose Xarelto upon discharge which she was placed on.  2. Elevated troponin-likely secondary supply demand ischemia from hypoxemia and the pulmonary embolism. -no evidence of ACS while in the hospital.   3. Tachycardia/SVT-patient has a history of atrial tachycardia. She was Seen by cardiology, ejection fraction normal on previous echocardiogram.  - As per Cards Tachycardia was exacerbated by hypokalemia. Potassium level is improved with supplementation. She was a bit orthostatic therefore cardiology was hesitant  and uptitrating her metoprolol. She was started on flecainide and is being discharged on that with follow-up with cardiology as an outpatient. - she will cont. Her low dose Metoprolol.   4. Leukopenia - due to recent chemo.  Improved w. Granix for 2 days and she was  given a dose of Neulasta prior to discharge and will follow up with [place] next week.    5. Hypokalemia - improved w/ supplementation.    6. Depression - cont. Cymbalta  7. Recurrent breast cancer-ER/PR positive HER-2/neu negative [status post liver biopsy February 2018; previously HER-2/neu positive]. currently on Eribulin. CTA showed improvement of liver lesions. Last received chemotherapy approximately 6days ago. Cont. Follow with Oncology (Dr. Rogue Bussing) as outpatient.   DISCHARGE CONDITIONS:   Stable.   CONSULTS OBTAINED:  Treatment Team:  Minna Merritts, MD Cammie Sickle, MD  DRUG ALLERGIES:   Allergies  Allergen Reactions  . No Known Allergies     DISCHARGE MEDICATIONS:   Allergies as of 01/16/2017      Reactions   No Known Allergies       Medication List    STOP taking these medications   clopidogrel 75 MG tablet Commonly known as:  PLAVIX   Magnesium 400 MG Tabs     TAKE these medications   anastrozole 1 MG tablet Commonly known as:  ARIMIDEX TAKE 1 TABLET DAILY   aspirin 81 MG tablet Take 81 mg by mouth daily.   diphenoxylate-atropine 2.5-0.025 MG tablet Commonly known as:  LOMOTIL Take 1 tablet by mouth 4 (four) times daily as needed for diarrhea or loose stools. Take it along with immodium   DULoxetine 60 MG capsule Commonly known as:  CYMBALTA TAKE 1 CAPSULE (60 MG TOTAL) BY MOUTH DAILY.   feeding supplement (ENSURE ENLIVE) Liqd Take 237 mLs by mouth 2 (two) times daily between meals.   flecainide 50 MG tablet Commonly known as:  TAMBOCOR Take 1 tablet (50 mg total) by mouth 2 (two) times daily.   KLOR-CON M20 20 MEQ tablet Generic drug:  potassium chloride SA Take 20 mEq by mouth 2 (two) times daily.   metoprolol tartrate 25 MG tablet Commonly known as:  LOPRESSOR Take 0.5 tablets (12.5 mg total) by mouth 2 (two) times daily. Hold if you're Systolic Blood pressure is less than 100.   ondansetron 8 MG  tablet Commonly known as:  ZOFRAN Take 1 tablet (8 mg total) by mouth every 8 (eight) hours as needed for nausea or vomiting (start 3 days; after chemo).   pantoprazole 40 MG tablet Commonly known as:  PROTONIX Take 1 tablet (40 mg total) by mouth 2 (two) times daily.   polyethylene glycol packet Commonly known as:  MIRALAX / GLYCOLAX Take 17 g by mouth daily.   rivaroxaban 10 MG Tabs tablet Commonly known as:  XARELTO Take 1 tablet (10 mg total) by mouth daily.         DISCHARGE INSTRUCTIONS:   DIET:  Cardiac diet  DISCHARGE CONDITION:  Stable  ACTIVITY:  Activity as tolerated  OXYGEN:  Home Oxygen: No.   Oxygen Delivery: room air  DISCHARGE LOCATION:  Home with Home Health PT, RN.    If you experience worsening of your admission symptoms, develop shortness of breath, life threatening emergency, suicidal or homicidal thoughts you must seek medical attention immediately by calling 911 or calling your MD immediately  if symptoms less severe.  You Must read complete instructions/literature along with all the possible adverse reactions/side effects  for all the Medicines you take and that have been prescribed to you. Take any new Medicines after you have completely understood and accpet all the possible adverse reactions/side effects.   Please note  You were cared for by a hospitalist during your hospital stay. If you have any questions about your discharge medications or the care you received while you were in the hospital after you are discharged, you can call the unit and asked to speak with the hospitalist on call if the hospitalist that took care of you is not available. Once you are discharged, your primary care physician will handle any further medical issues. Please note that NO REFILLS for any discharge medications will be authorized once you are discharged, as it is imperative that you return to your primary care physician (or establish a relationship with a  primary care physician if you do not have one) for your aftercare needs so that they can reassess your need for medications and monitor your lab values.     Today   No Shortness of breath, Hemoptysis.  HR stable.  No acute events overnight.   VITAL SIGNS:  Blood pressure (!) 89/54, pulse 88, temperature 98.2 F (36.8 C), temperature source Oral, resp. rate 16, height 5' 6" (1.676 m), weight 61.1 kg (134 lb 9.6 oz), SpO2 100 %.  I/O:   Intake/Output Summary (Last 24 hours) at 01/16/17 1456 Last data filed at 01/16/17 0000  Gross per 24 hour  Intake                0 ml  Output              200 ml  Net             -200 ml    PHYSICAL EXAMINATION:   GENERAL:  61 y.o.-year-old patient lying in bed in NAD.   EYES: Pupils equal, round, reactive to light. No scleral icterus. Extraocular muscles intact.  HEENT: Head atraumatic, normocephalic. Oropharynx and nasopharynx clear.  NECK:  Supple, no jugular venous distention. No thyroid enlargement, no tenderness.  LUNGS: Normal breath sounds bilaterally, no wheezing, rales, rhonchi. No use of accessory muscles of respiration.  CARDIOVASCULAR: S1, S2 normal. No murmurs, rubs, or gallops.  ABDOMEN: Soft, nontender, nondistended. Bowel sounds present. No organomegaly or mass.  EXTREMITIES: No cyanosis, clubbing or edema b/l.    NEUROLOGIC: Cranial nerves II through XII are intact. No focal Motor or sensory deficits b/l.  Globally weak. PSYCHIATRIC: The patient is alert and oriented x 3.  SKIN: No obvious rash, lesion, or ulcer.   Right chest wall Port-A-Cath.   DATA REVIEW:   CBC  Recent Labs Lab 01/16/17 0551  WBC 1.2*  HGB 9.2*  HCT 28.1*  PLT 162    Chemistries   Recent Labs Lab 01/12/17 1514  01/16/17 0551  NA 135  < > 135  K 2.9*  < > 3.8  CL 100*  < > 100*  CO2 27  < > 29  GLUCOSE 151*  < > 70  BUN 9  < > 7  CREATININE 0.51  < > 0.55  CALCIUM 8.1*  < > 8.1*  MG  --   < > 1.5*  AST 85*  --   --   ALT 27  --    --   ALKPHOS 217*  --   --   BILITOT 1.5*  --   --   < > = values in this interval not displayed.  Cardiac Enzymes  Recent Labs Lab 01/12/17 1514  TROPONINI 0.06*    Microbiology Results  Results for orders placed or performed during the hospital encounter of 01/12/17  Culture, blood (routine x 2)     Status: None (Preliminary result)   Collection Time: 01/12/17  3:14 PM  Result Value Ref Range Status   Specimen Description BLOOD R PORT  Final   Special Requests BOTTLES DRAWN AEROBIC AND ANAEROBIC BCAV  Final   Culture NO GROWTH 4 DAYS  Final   Report Status PENDING  Incomplete  Culture, blood (routine x 2)     Status: None (Preliminary result)   Collection Time: 01/12/17  4:45 PM  Result Value Ref Range Status   Specimen Description BLOOD R PORT  Final   Special Requests BOTTLES DRAWN AEROBIC AND ANAEROBIC R PORT  Final   Culture NO GROWTH 4 DAYS  Final   Report Status PENDING  Incomplete    RADIOLOGY:  No results found.    Management plans discussed with the patient, family and they are in agreement.  CODE STATUS:     Code Status Orders        Start     Ordered   01/12/17 1822  Full code  Continuous     01/12/17 1823    Code Status History    Date Active Date Inactive Code Status Order ID Comments User Context   12/04/2016  8:31 PM 12/11/2016  3:30 PM Full Code [id number]  Fritzi Mandes, MD Inpatient   11/10/2016  8:59 AM 11/12/2016  4:00 PM Full Code [id number]  Hillary Bow, MD ED    Advance Directive Documentation     Most Recent Value  Type of Advance Directive  Living will  Pre-existing out of facility DNR order (yellow form or pink MOST form)  -  "MOST" Form in Place?  -      TOTAL TIME TAKING CARE OF THIS PATIENT: 40 minutes.    Henreitta Leber M.D on 01/16/2017 at 2:56 PM  Between 7am to 6pm - Pager - [phone]  After 6pm go to [email or web address] - Proofreader  [facility]  Office  [phone]  CC: Primary  care physician; Keith Rake, MD

## 2017-01-17 LAB — CULTURE, BLOOD (ROUTINE X 2)
CULTURE: NO GROWTH
Culture: NO GROWTH

## 2017-01-18 ENCOUNTER — Ambulatory Visit (INDEPENDENT_AMBULATORY_CARE_PROVIDER_SITE_OTHER): Payer: BLUE CROSS/BLUE SHIELD | Admitting: Family Medicine

## 2017-01-18 ENCOUNTER — Encounter: Payer: Self-pay | Admitting: Family Medicine

## 2017-01-18 ENCOUNTER — Telehealth: Payer: Self-pay | Admitting: Cardiovascular Disease

## 2017-01-18 VITALS — BP 122/68 | HR 120 | Temp 97.8°F | Resp 17 | Ht 66.0 in | Wt 143.7 lb

## 2017-01-18 DIAGNOSIS — C50919 Malignant neoplasm of unspecified site of unspecified female breast: Secondary | ICD-10-CM

## 2017-01-18 DIAGNOSIS — I10 Essential (primary) hypertension: Secondary | ICD-10-CM

## 2017-01-18 DIAGNOSIS — E7800 Pure hypercholesterolemia, unspecified: Secondary | ICD-10-CM

## 2017-01-18 LAB — LIPID PANEL
CHOLESTEROL: 136 mg/dL (ref <200)
HDL: 19 mg/dL — AB (ref >50)
LDL CALC: 92 mg/dL (ref <100)
TRIGLYCERIDES: 127 mg/dL (ref <150)
Total CHOL/HDL Ratio: 7.2 Ratio — ABNORMAL HIGH (ref <5.0)
VLDL: 25 mg/dL (ref <30)

## 2017-01-18 NOTE — Progress Notes (Signed)
Name: Sarah Horne   MRN: [id number]    DOB: 1955-10-26   Date:01/18/2017       Progress Note  Subjective  Chief Complaint  Chief Complaint  Patient presents with  . Follow-up    complete paperwork    Hypertension  This is a chronic problem. The problem is unchanged. The problem is controlled. Pertinent negatives include no blurred vision, chest pain, headaches, palpitations or shortness of breath. Past treatments include beta blockers (now changed to Metoprolol 12.5 mg BID). There is no history of kidney disease, CAD/MI or CVA.  Hyperlipidemia  This is a chronic problem. The problem is controlled. Recent lipid tests were reviewed and are normal. Pertinent negatives include no chest pain or shortness of breath. She is currently on no antihyperlipidemic treatment (previously on statin therapy, now stopped by Oncology after evidence of liver mets.).    Patient presents for paperwork completion pertaining to her Occupational Therapy regimen, she gets treatments twice a week, mainly helps with strengthening of legs and arms, seems to be progressing fairly well.  She has metastatic breast cancer and is being followed by Oncology, has GI bleed, pulmonary embolism, and SVT  Past Medical History:  Diagnosis Date  . Chemotherapy-induced peripheral neuropathy ([place])   . Epistaxis    a. 11/2016 in setting of asa/plavix-->silver nitrate cauterization.  . GI bleed    a. 11/2016 Admission w/ GIB and hypovolemic shock req 3u PRBC's;  b. 11/2016 ECG: gastritis & nonbleeding peptic ulcer; c. 11/2016 Conlonoscopy: rectal and sigmoid colonic ulcers.  . Heart attack ([place])    a. 1998 Cath @ [facility]: reportedly no intervention required.  Marland Kitchen Herceptin-induced cardiomyopathy ([place])    a. In the setting of Herceptin Rx for breast cancer (initiated 12/2014); b. 03/2015 MUGA EF 64%; b. 08/2015 MUGA: EF 51%; c. 10/2015 MUGA: EF 44%; d. 11/2015 Echo: EF 45-50%; e. 01/2016 MUGA: EF 60%; f. 06/2016 MUGA EF 65%; g. 10/2016 MUGA: EF  61%;  h. 12/2016 Echo: EF 55-60%, gr1 DD.  Marland Kitchen Hyperlipidemia   . Hypertension   . Neuropathy   . Possible PAD (peripheral artery disease) ([place])    a. 11/2016 LE cyanosis and weak pulses-->CTA w/o significant Ao-BiFem dzs. ? distal dzs-->ASA/Plavix initiated by vascular surgery.  Marland Kitchen PSVT (paroxysmal supraventricular tachycardia) ([place])    a. Dx 11/2016.  . Pulmonary embolism ([place])    a. 12/2016 CTA Chest: small nonocclusive PE in inferior segment of the Left lingula, somewhat eccentric filling defect suggesting chronic rather than acute embolic event; b. 04/6760 LE U/S:  No DVT; c. 12/2016 Echo: Nl RV fxn, nl PASP.  Marland Kitchen Recurrent Metastatic breast cancer ([place])    a. Dx 2016: Stage II, ER positive, PR positive, HER-2/neu overexpressing of the left breast-->chemo/radiation; b. 10/2016 CT Abd/pelvis: diffuse liver mets, ill defined sclerotic bone lesions-T12;  c. 10/2016 MRI brain: metastatic lesion along L temporal lobe (19x61m) w/ extensive surrounding edema & 541mmidline shift to right.  . Sinus tachycardia     Past Surgical History:  Procedure Laterality Date  . BREAST BIOPSY Left 2016   Positive  . BREAST LUMPECTOMY WITH SENTINEL LYMPH NODE BIOPSY Left 05/23/2015   Procedure: LEFT BREAST WIDE EXCISION WITH AXILLARY DISSECTION, MASTOPLASTY ;  Surgeon: JeRobert BellowMD;  Location: [facility] ORS;  Service: General;  Laterality: Left;  . BREAST SURGERY Left 12/18/14   breast biopsy/INVASIVE DUCTAL CARCINOMA OF BREAST, NOTTINGHAM GRADE 2.  . Marland KitchenREAST SURGERY  05/23/2015.   Wide excision/mastoplasty, axillary dissection. No residual  invasive cancer, positive for residual DCIS. 0/2 nodes identified on axillary dissection. (no SLN by technetium or methylene blue)  . CARDIAC CATHETERIZATION    . COLONOSCOPY WITH PROPOFOL N/A 12/10/2016   Procedure: COLONOSCOPY WITH PROPOFOL;  Surgeon: Lucilla Lame, MD;  Location: [facility] ENDOSCOPY;  Service: Endoscopy;  Laterality: N/A;  . ESOPHAGOGASTRODUODENOSCOPY (EGD) WITH  PROPOFOL N/A 12/08/2016   Procedure: ESOPHAGOGASTRODUODENOSCOPY (EGD) WITH PROPOFOL;  Surgeon: Lucilla Lame, MD;  Location: [facility] ENDOSCOPY;  Service: Endoscopy;  Laterality: N/A;  . PORTACATH PLACEMENT Right 12-31-14   Dr Bary Castilla    Family History  Problem Relation Age of Onset  . Breast cancer Maternal Aunt   . Breast cancer Cousin   . Brain cancer Maternal Uncle     Social History   Social History  . Marital status: Divorced    Spouse name: N/A  . Number of children: N/A  . Years of education: N/A   Occupational History  . Not on file.   Social History Main Topics  . Smoking status: Light Tobacco Smoker    Packs/day: 0.50    Years: 18.00    Types: Cigarettes  . Smokeless tobacco: Never Used  . Alcohol use No  . Drug use: No  . Sexual activity: Not on file   Other Topics Concern  . Not on file   Social History Narrative   Lives in [place] by herself.     Current Outpatient Prescriptions:  .  anastrozole (ARIMIDEX) 1 MG tablet, TAKE 1 TABLET DAILY, Disp: 90 tablet, Rfl: 3 .  aspirin 81 MG tablet, Take 81 mg by mouth daily., Disp: , Rfl:  .  diphenoxylate-atropine (LOMOTIL) 2.5-0.025 MG tablet, Take 1 tablet by mouth 4 (four) times daily as needed for diarrhea or loose stools. Take it along with immodium, Disp: 60 tablet, Rfl: 0 .  DULoxetine (CYMBALTA) 60 MG capsule, TAKE 1 CAPSULE (60 MG TOTAL) BY MOUTH DAILY., Disp: 30 capsule, Rfl: 0 .  feeding supplement, ENSURE ENLIVE, (ENSURE ENLIVE) LIQD, Take 237 mLs by mouth 2 (two) times daily between meals., Disp: 237 mL, Rfl: 12 .  flecainide (TAMBOCOR) 50 MG tablet, Take 1 tablet (50 mg total) by mouth 2 (two) times daily., Disp: 60 tablet, Rfl: 1 .  KLOR-CON M20 20 MEQ tablet, Take 20 mEq by mouth 2 (two) times daily., Disp: , Rfl: 3 .  metoprolol tartrate (LOPRESSOR) 25 MG tablet, Take 0.5 tablets (12.5 mg total) by mouth 2 (two) times daily. Hold if you're Systolic Blood pressure is less than 100., Disp: 90 tablet,  Rfl: 3 .  ondansetron (ZOFRAN) 8 MG tablet, Take 1 tablet (8 mg total) by mouth every 8 (eight) hours as needed for nausea or vomiting (start 3 days; after chemo)., Disp: 40 tablet, Rfl: 1 .  pantoprazole (PROTONIX) 40 MG tablet, Take 1 tablet (40 mg total) by mouth 2 (two) times daily., Disp: 60 tablet, Rfl: 2 .  polyethylene glycol (MIRALAX / GLYCOLAX) packet, Take 17 g by mouth daily., Disp: 14 each, Rfl: 0 .  rivaroxaban (XARELTO) 10 MG TABS tablet, Take 1 tablet (10 mg total) by mouth daily., Disp: 30 tablet, Rfl: 1 No current facility-administered medications for this visit.   Facility-Administered Medications Ordered in Other Visits:  .  0.9 %  sodium chloride infusion, , Intravenous, Continuous, [name], MD .  0.9 %  sodium chloride infusion, , Intravenous, Continuous, Lloyd Huger, MD, Last Rate: 999 mL/hr at 04/24/15 1520 .  heparin lock flush 100 unit/mL, 500 Units, Intravenous,  Once, Roxana Hires, MD .  sodium chloride 0.9 % injection 10 mL, 10 mL, Intravenous, PRN, [name], MD, 10 mL at 04/04/15 1440 .  sodium chloride flush (NS) 0.9 % injection 10 mL, 10 mL, Intravenous, PRN, Dmitriy Berenzon, MD .  Tbo-Filgrastim [facility]) injection 480 mcg, 480 mcg, Subcutaneous, Once, Cammie Sickle, MD  Allergies  Allergen Reactions  . No Known Allergies      Review of Systems  Eyes: Negative for blurred vision.  Respiratory: Negative for shortness of breath.   Cardiovascular: Negative for chest pain and palpitations.  Neurological: Negative for headaches.      Objective  Vitals:   01/18/17 0914  BP: 122/68  Pulse: (!) 120  Resp: 17  Temp: 97.8 F (36.6 C)  TempSrc: Oral  SpO2: 94%  Weight: 143 lb 11.2 oz (65.2 kg)  Height: _0  (1.676 m)    Physical Exam  Constitutional: She is well-developed, well-nourished, and in no distress.  Cardiovascular: Regular rhythm, S1 normal and S2 normal.  Tachycardia present.   No murmur  heard. Pulmonary/Chest: Effort normal and breath sounds normal. No respiratory distress. She has no decreased breath sounds. She has no rhonchi.  Abdominal: Soft. Bowel sounds are normal. There is no tenderness.  Nursing note and vitals reviewed.     Assessment & Plan  1. Essential hypertension BP stable on present antihypertensive therapy, patient is tachycardic despite being on beta blocker, being managed by cardiology.  2. Pure hypercholesterolemia No statins, obtain FLP and follow-up. - Lipid panel  3. Metastatic breast cancer [facility]) Reviewed previous records and office visits with oncology, discussed with patient in detail   Bodhi Stenglein [facility] 01/18/2017 9:41 AM

## 2017-01-18 NOTE — Telephone Encounter (Signed)
Noted  

## 2017-01-18 NOTE — Telephone Encounter (Signed)
Home health nurse calling stating she missed a day with patient. Patient was in hospital at the time Just letting us know

## 2017-01-20 DIAGNOSIS — G620 Drug-induced polyneuropathy: Secondary | ICD-10-CM | POA: Diagnosis not present

## 2017-01-20 DIAGNOSIS — C787 Secondary malignant neoplasm of liver and intrahepatic bile duct: Secondary | ICD-10-CM | POA: Diagnosis not present

## 2017-01-20 DIAGNOSIS — C7951 Secondary malignant neoplasm of bone: Secondary | ICD-10-CM | POA: Diagnosis not present

## 2017-01-20 DIAGNOSIS — K270 Acute peptic ulcer, site unspecified, with hemorrhage: Secondary | ICD-10-CM | POA: Diagnosis not present

## 2017-01-20 DIAGNOSIS — C7931 Secondary malignant neoplasm of brain: Secondary | ICD-10-CM | POA: Diagnosis not present

## 2017-01-20 DIAGNOSIS — Z7982 Long term (current) use of aspirin: Secondary | ICD-10-CM | POA: Diagnosis not present

## 2017-01-20 DIAGNOSIS — C50912 Malignant neoplasm of unspecified site of left female breast: Secondary | ICD-10-CM | POA: Diagnosis not present

## 2017-01-20 DIAGNOSIS — I10 Essential (primary) hypertension: Secondary | ICD-10-CM | POA: Diagnosis not present

## 2017-01-20 DIAGNOSIS — F17210 Nicotine dependence, cigarettes, uncomplicated: Secondary | ICD-10-CM | POA: Diagnosis not present

## 2017-01-21 DIAGNOSIS — G620 Drug-induced polyneuropathy: Secondary | ICD-10-CM | POA: Diagnosis not present

## 2017-01-21 DIAGNOSIS — K270 Acute peptic ulcer, site unspecified, with hemorrhage: Secondary | ICD-10-CM | POA: Diagnosis not present

## 2017-01-21 DIAGNOSIS — C787 Secondary malignant neoplasm of liver and intrahepatic bile duct: Secondary | ICD-10-CM | POA: Diagnosis not present

## 2017-01-21 DIAGNOSIS — Z7982 Long term (current) use of aspirin: Secondary | ICD-10-CM | POA: Diagnosis not present

## 2017-01-21 DIAGNOSIS — C50912 Malignant neoplasm of unspecified site of left female breast: Secondary | ICD-10-CM | POA: Diagnosis not present

## 2017-01-21 DIAGNOSIS — I10 Essential (primary) hypertension: Secondary | ICD-10-CM | POA: Diagnosis not present

## 2017-01-21 DIAGNOSIS — C7951 Secondary malignant neoplasm of bone: Secondary | ICD-10-CM | POA: Diagnosis not present

## 2017-01-21 DIAGNOSIS — C7931 Secondary malignant neoplasm of brain: Secondary | ICD-10-CM | POA: Diagnosis not present

## 2017-01-21 DIAGNOSIS — F17210 Nicotine dependence, cigarettes, uncomplicated: Secondary | ICD-10-CM | POA: Diagnosis not present

## 2017-01-22 DIAGNOSIS — I10 Essential (primary) hypertension: Secondary | ICD-10-CM | POA: Diagnosis not present

## 2017-01-22 DIAGNOSIS — C7931 Secondary malignant neoplasm of brain: Secondary | ICD-10-CM | POA: Diagnosis not present

## 2017-01-22 DIAGNOSIS — Z7982 Long term (current) use of aspirin: Secondary | ICD-10-CM | POA: Diagnosis not present

## 2017-01-22 DIAGNOSIS — K270 Acute peptic ulcer, site unspecified, with hemorrhage: Secondary | ICD-10-CM | POA: Diagnosis not present

## 2017-01-22 DIAGNOSIS — G620 Drug-induced polyneuropathy: Secondary | ICD-10-CM | POA: Diagnosis not present

## 2017-01-22 DIAGNOSIS — C787 Secondary malignant neoplasm of liver and intrahepatic bile duct: Secondary | ICD-10-CM | POA: Diagnosis not present

## 2017-01-22 DIAGNOSIS — C7951 Secondary malignant neoplasm of bone: Secondary | ICD-10-CM | POA: Diagnosis not present

## 2017-01-22 DIAGNOSIS — C50912 Malignant neoplasm of unspecified site of left female breast: Secondary | ICD-10-CM | POA: Diagnosis not present

## 2017-01-22 DIAGNOSIS — F17210 Nicotine dependence, cigarettes, uncomplicated: Secondary | ICD-10-CM | POA: Diagnosis not present

## 2017-01-25 DIAGNOSIS — C787 Secondary malignant neoplasm of liver and intrahepatic bile duct: Secondary | ICD-10-CM | POA: Diagnosis not present

## 2017-01-25 DIAGNOSIS — C7951 Secondary malignant neoplasm of bone: Secondary | ICD-10-CM | POA: Diagnosis not present

## 2017-01-25 DIAGNOSIS — F17210 Nicotine dependence, cigarettes, uncomplicated: Secondary | ICD-10-CM | POA: Diagnosis not present

## 2017-01-25 DIAGNOSIS — K270 Acute peptic ulcer, site unspecified, with hemorrhage: Secondary | ICD-10-CM | POA: Diagnosis not present

## 2017-01-25 DIAGNOSIS — C7931 Secondary malignant neoplasm of brain: Secondary | ICD-10-CM | POA: Diagnosis not present

## 2017-01-25 DIAGNOSIS — Z7982 Long term (current) use of aspirin: Secondary | ICD-10-CM | POA: Diagnosis not present

## 2017-01-25 DIAGNOSIS — C50912 Malignant neoplasm of unspecified site of left female breast: Secondary | ICD-10-CM | POA: Diagnosis not present

## 2017-01-25 DIAGNOSIS — G620 Drug-induced polyneuropathy: Secondary | ICD-10-CM | POA: Diagnosis not present

## 2017-01-25 DIAGNOSIS — I10 Essential (primary) hypertension: Secondary | ICD-10-CM | POA: Diagnosis not present

## 2017-01-26 ENCOUNTER — Other Ambulatory Visit: Payer: Self-pay | Admitting: Internal Medicine

## 2017-01-26 ENCOUNTER — Telehealth: Payer: Self-pay | Admitting: Family Medicine

## 2017-01-26 DIAGNOSIS — Z170 Estrogen receptor positive status [ER+]: Principal | ICD-10-CM

## 2017-01-26 DIAGNOSIS — C50412 Malignant neoplasm of upper-outer quadrant of left female breast: Secondary | ICD-10-CM

## 2017-01-26 NOTE — Telephone Encounter (Signed)
Sarah Horne from [facility] is requesting verbal order for 1 week 2 for occupational therapy starting on Monday 02-01-17. [phone]

## 2017-01-27 ENCOUNTER — Telehealth: Payer: Self-pay | Admitting: Cardiovascular Disease

## 2017-01-27 DIAGNOSIS — I10 Essential (primary) hypertension: Secondary | ICD-10-CM | POA: Diagnosis not present

## 2017-01-27 DIAGNOSIS — Z7982 Long term (current) use of aspirin: Secondary | ICD-10-CM | POA: Diagnosis not present

## 2017-01-27 DIAGNOSIS — G620 Drug-induced polyneuropathy: Secondary | ICD-10-CM | POA: Diagnosis not present

## 2017-01-27 DIAGNOSIS — F17210 Nicotine dependence, cigarettes, uncomplicated: Secondary | ICD-10-CM | POA: Diagnosis not present

## 2017-01-27 DIAGNOSIS — C787 Secondary malignant neoplasm of liver and intrahepatic bile duct: Secondary | ICD-10-CM | POA: Diagnosis not present

## 2017-01-27 DIAGNOSIS — C50912 Malignant neoplasm of unspecified site of left female breast: Secondary | ICD-10-CM | POA: Diagnosis not present

## 2017-01-27 DIAGNOSIS — K270 Acute peptic ulcer, site unspecified, with hemorrhage: Secondary | ICD-10-CM | POA: Diagnosis not present

## 2017-01-27 DIAGNOSIS — C7931 Secondary malignant neoplasm of brain: Secondary | ICD-10-CM | POA: Diagnosis not present

## 2017-01-27 DIAGNOSIS — C7951 Secondary malignant neoplasm of bone: Secondary | ICD-10-CM | POA: Diagnosis not present

## 2017-01-27 NOTE — Telephone Encounter (Signed)
Mardene Celeste with [facility] calling needing some more information  Pt is on metoprolol and her BP  Pt is taking metoprolol 12.5 twice a day and she is hold it if her systolic is less than 091  Her BP is running a bit low even before medications Around 100's and this is after medication  Before its around 110 Seems to be she doesn't have BP issues Was asking to see if there is another medication she can take that would help with HR more  Please advise.

## 2017-01-27 NOTE — Telephone Encounter (Signed)
S/w Mardene Celeste, [place] nurse, who thinks pt doesn't need metoprolol as her BP often runs low. Metoprolol 12.'5mg'$  BID to help w/tachycardia. Pt worked w/PT this morning. BP 110/64, HR 90s while ambulating. BP 120/74 before metoprolol. Recheck SBP 90s Pt only has one more home health visit and the nurse would like to help pt establish higher parameters for when to hold metoprolol. Flecainide '50mg'$  BID was prescribed at recent hospital admission.  I s/w pt who denies any dizziness or lightheadedness when SBP 90s. States she feels well and HR has been controlled since starting flecainide.  Discussed w/Ryan Dunn, PA-C who instructs to hold metoprolol if SBP <110. Reviewed w/pt and Mardene Celeste, [facility] nurse. Both verbalized understanding.

## 2017-01-28 ENCOUNTER — Inpatient Hospital Stay: Payer: BLUE CROSS/BLUE SHIELD | Attending: Internal Medicine

## 2017-01-28 ENCOUNTER — Inpatient Hospital Stay (HOSPITAL_BASED_OUTPATIENT_CLINIC_OR_DEPARTMENT_OTHER): Payer: BLUE CROSS/BLUE SHIELD | Admitting: Internal Medicine

## 2017-01-28 ENCOUNTER — Inpatient Hospital Stay: Payer: BLUE CROSS/BLUE SHIELD

## 2017-01-28 VITALS — BP 104/70 | HR 89 | Temp 97.8°F | Resp 16 | Wt 151.0 lb

## 2017-01-28 DIAGNOSIS — B370 Candidal stomatitis: Secondary | ICD-10-CM | POA: Diagnosis not present

## 2017-01-28 DIAGNOSIS — Z5111 Encounter for antineoplastic chemotherapy: Secondary | ICD-10-CM | POA: Insufficient documentation

## 2017-01-28 DIAGNOSIS — Z923 Personal history of irradiation: Secondary | ICD-10-CM

## 2017-01-28 DIAGNOSIS — I471 Supraventricular tachycardia: Secondary | ICD-10-CM | POA: Insufficient documentation

## 2017-01-28 DIAGNOSIS — K922 Gastrointestinal hemorrhage, unspecified: Secondary | ICD-10-CM | POA: Diagnosis not present

## 2017-01-28 DIAGNOSIS — E876 Hypokalemia: Secondary | ICD-10-CM | POA: Insufficient documentation

## 2017-01-28 DIAGNOSIS — Z86711 Personal history of pulmonary embolism: Secondary | ICD-10-CM | POA: Insufficient documentation

## 2017-01-28 DIAGNOSIS — Z803 Family history of malignant neoplasm of breast: Secondary | ICD-10-CM

## 2017-01-28 DIAGNOSIS — Z7982 Long term (current) use of aspirin: Secondary | ICD-10-CM | POA: Insufficient documentation

## 2017-01-28 DIAGNOSIS — G629 Polyneuropathy, unspecified: Secondary | ICD-10-CM | POA: Insufficient documentation

## 2017-01-28 DIAGNOSIS — F17210 Nicotine dependence, cigarettes, uncomplicated: Secondary | ICD-10-CM

## 2017-01-28 DIAGNOSIS — Z7901 Long term (current) use of anticoagulants: Secondary | ICD-10-CM | POA: Insufficient documentation

## 2017-01-28 DIAGNOSIS — E785 Hyperlipidemia, unspecified: Secondary | ICD-10-CM | POA: Insufficient documentation

## 2017-01-28 DIAGNOSIS — C7931 Secondary malignant neoplasm of brain: Secondary | ICD-10-CM | POA: Insufficient documentation

## 2017-01-28 DIAGNOSIS — Z79899 Other long term (current) drug therapy: Secondary | ICD-10-CM | POA: Insufficient documentation

## 2017-01-28 DIAGNOSIS — M7989 Other specified soft tissue disorders: Secondary | ICD-10-CM | POA: Diagnosis not present

## 2017-01-28 DIAGNOSIS — Z7689 Persons encountering health services in other specified circumstances: Secondary | ICD-10-CM

## 2017-01-28 DIAGNOSIS — R040 Epistaxis: Secondary | ICD-10-CM

## 2017-01-28 DIAGNOSIS — Z808 Family history of malignant neoplasm of other organs or systems: Secondary | ICD-10-CM

## 2017-01-28 DIAGNOSIS — C50412 Malignant neoplasm of upper-outer quadrant of left female breast: Secondary | ICD-10-CM | POA: Diagnosis not present

## 2017-01-28 DIAGNOSIS — I10 Essential (primary) hypertension: Secondary | ICD-10-CM

## 2017-01-28 DIAGNOSIS — Z170 Estrogen receptor positive status [ER+]: Secondary | ICD-10-CM | POA: Diagnosis not present

## 2017-01-28 DIAGNOSIS — T451X5A Adverse effect of antineoplastic and immunosuppressive drugs, initial encounter: Principal | ICD-10-CM

## 2017-01-28 DIAGNOSIS — I252 Old myocardial infarction: Secondary | ICD-10-CM | POA: Insufficient documentation

## 2017-01-28 DIAGNOSIS — M8580 Other specified disorders of bone density and structure, unspecified site: Secondary | ICD-10-CM | POA: Insufficient documentation

## 2017-01-28 DIAGNOSIS — D709 Neutropenia, unspecified: Secondary | ICD-10-CM

## 2017-01-28 DIAGNOSIS — M21371 Foot drop, right foot: Secondary | ICD-10-CM | POA: Diagnosis not present

## 2017-01-28 DIAGNOSIS — R948 Abnormal results of function studies of other organs and systems: Secondary | ICD-10-CM

## 2017-01-28 DIAGNOSIS — D701 Agranulocytosis secondary to cancer chemotherapy: Secondary | ICD-10-CM

## 2017-01-28 LAB — COMPREHENSIVE METABOLIC PANEL
ALBUMIN: 2.3 g/dL — AB (ref 3.5–5.0)
ALK PHOS: 195 U/L — AB (ref 38–126)
ALT: 22 U/L (ref 14–54)
ANION GAP: 5 (ref 5–15)
AST: 46 U/L — ABNORMAL HIGH (ref 15–41)
BILIRUBIN TOTAL: 1.4 mg/dL — AB (ref 0.3–1.2)
BUN: 8 mg/dL (ref 6–20)
CALCIUM: 8.2 mg/dL — AB (ref 8.9–10.3)
CO2: 30 mmol/L (ref 22–32)
CREATININE: 0.4 mg/dL — AB (ref 0.44–1.00)
Chloride: 99 mmol/L — ABNORMAL LOW (ref 101–111)
GFR calc Af Amer: >60 mL/min (ref >60)
GFR calc non Af Amer: >60 mL/min (ref >60)
GLUCOSE: 82 mg/dL (ref 65–99)
Potassium: 2.8 mmol/L — ABNORMAL LOW (ref 3.5–5.1)
Sodium: 134 mmol/L — ABNORMAL LOW (ref 135–145)
Total Protein: 5.8 g/dL — ABNORMAL LOW (ref 6.5–8.1)

## 2017-01-28 LAB — CBC WITH DIFFERENTIAL/PLATELET
BASOS PCT: 1 %
Basophils Absolute: 0 10*3/uL (ref 0–0.1)
Eosinophils Absolute: 0 10*3/uL (ref 0–0.7)
Eosinophils Relative: 0 %
HEMATOCRIT: 29.5 % — AB (ref 35.0–47.0)
HEMOGLOBIN: 9.9 g/dL — AB (ref 12.0–16.0)
LYMPHS ABS: 0.6 10*3/uL — AB (ref 1.0–3.6)
Lymphocytes Relative: 22 %
MCH: 30.3 pg (ref 26.0–34.0)
MCHC: 33.6 g/dL (ref 32.0–36.0)
MCV: 90.3 fL (ref 80.0–100.0)
MONOS PCT: 8 %
Monocytes Absolute: 0.2 10*3/uL (ref 0.2–0.9)
NEUTROS ABS: 1.9 10*3/uL (ref 1.4–6.5)
NEUTROS PCT: 69 %
Platelets: 218 10*3/uL (ref 150–440)
RBC: 3.26 MIL/uL — ABNORMAL LOW (ref 3.80–5.20)
RDW: 22.7 % — ABNORMAL HIGH (ref 11.5–14.5)
WBC: 2.7 10*3/uL — ABNORMAL LOW (ref 3.6–11.0)

## 2017-01-28 MED ORDER — POTASSIUM CHLORIDE CRYS ER 20 MEQ PO TBCR: EXTENDED_RELEASE_TABLET | ORAL | 3 refills | Status: DC

## 2017-01-28 MED ORDER — HEPARIN SOD (PORK) LOCK FLUSH 100 UNIT/ML IV SOLN
500.0000 [IU] | Freq: Once | INTRAVENOUS | Status: AC | PRN
  Administered 2017-01-28: 500 [IU]
  Filled 2017-01-28: qty 5

## 2017-01-28 MED ORDER — PROCHLORPERAZINE MALEATE 10 MG PO TABS
10.0000 mg | ORAL_TABLET | Freq: Once | ORAL | Status: AC
  Administered 2017-01-28: 10 mg via ORAL
  Filled 2017-01-28: qty 1

## 2017-01-28 MED ORDER — SODIUM CHLORIDE 0.9 % IV SOLN
Freq: Once | INTRAVENOUS | Status: AC
  Administered 2017-01-28: 11:00:00 via INTRAVENOUS
  Filled 2017-01-28: qty 1000

## 2017-01-28 MED ORDER — ERIBULIN MESYLATE CHEMO INJECTION 1 MG/2ML
0.8000 mg/m2 | Freq: Once | INTRAVENOUS | Status: AC
  Administered 2017-01-28: 1.4 mg via INTRAVENOUS
  Filled 2017-01-28: qty 2.8

## 2017-01-28 MED ORDER — POTASSIUM CHLORIDE CRYS ER 10 MEQ PO TBCR
20.0000 meq | EXTENDED_RELEASE_TABLET | Freq: Once | ORAL | Status: AC
  Administered 2017-01-28: 20 meq via ORAL
  Filled 2017-01-28: qty 2

## 2017-01-28 NOTE — Assessment & Plan Note (Addendum)
#  RECURRENT Metastatic breast cancer- ER/PR positive HER-2/neu negative [status post liver biopsy February 2018; previously HER-2/neu positive]. # s/p  Eribulin day 8   appx  3 week ago. Clinical response noted; however severe neutropenia [C discussion below]  # proceed with cycle #2- day-1. Reduce dose of chemotherapy to 0.8 mg/m on day 1; and 1.2 mg/m on day 8. Add onpro.  # Growth factor-Neulasta/On pro would be given as prophylaxis for chemotherapy-induced neutropenia to prevent febrile neutropenias.  # Brain mets- s/p RT [finished Feb 28th]; symptomatic improvement.   will plan to get MRI in 2-3 months;   # Bilateral lower extremities swelling- dependent edema/steroids/ improved. Continue  leg elevation  # PN G-1-2 Continue neurontin/cymblata.   # Elevated LFTs- secondary to involvement of the liver/ AST ALT improving.  # Hypokalemia- recommend K supp.   # 1 week labs/ chemo eribulin; 3 weeks/labs/chemo.

## 2017-01-28 NOTE — Progress Notes (Signed)
[place] OFFICE PROGRESS NOTE  Patient Care Team: [name], MD as PCP - General (Family Medicine) [name], MD (General Surgery) [name], MD (Inactive) as Attending Physician (Internal Medicine)  No matching staging information was found for the patient.   Oncology History   # LEFT BREAST IDC; STAGE II [cT2N1] ER >90%; PR- 50-90%; her 2 NEU- POS; s/p Neoadj chemo; AUG 2016- TCH+P s/p Lumpec & partial ALND- path CR; s/p RT [finished Nov 2016];  adj Herceptin; HELD for Jan 19th 2017 [in DEC 2016-EF dropped from 63 to 51%; FEB 27th EF-42.9%]; JAN 2016 START Arimidex; May 2017- EF- 60%; May 24th 2017-Re-start Herceptin q 3W; July 28th STOP herceptin [finished 63m/m2; sec to Low EF; however 2017 Oct EF= 67%; improved]  # DEC 4th 2017- Start Neratinib 4 pills; DEC 11th 3 pills; STOPPED.  # FEB 2018- RECURRENCE ER/PR positive; Her 2 NEGATIVE [liver Bx]  # MARCH 1st 2018- Tax-Cytoxan  # FEB 2018- Brain mets [SBRT; Dr.Crystal; finished Feb 28th 2018]  # March 2018- Eribulin  # PN- G-2; may 2017- Cymbalta 650md;MARCH 2018-  acute vascular insuff of BIL LE [? Taxotere vasoconstriction on asprin]; # hemorragic shock LGIB [s/p colo; Dr.Wohl]    # April 2016- Liver Bx- NEG;   # Drop in EF from Herceptin [recovered OCt 27th- EF 65%]  # BMD- jan 2017- osteopenia     Carcinoma of upper-outer quadrant of left breast in female, estrogen receptor positive ([facility]  12/27/2014 Initial Diagnosis    Breast cancer of upper-outer quadrant of left female breast ([place]       INTERVAL HISTORY:  A very pleasant [age]ear old female patient with above history of Recurrent stage IV ER/PR positive HER-2/neu negative [status post liver biopsy February 2018] Status post cycle #1 day-8 of eribulin- approximately 4 weeks ago.   in the interim patient was admitted to the hospital for tachycardia-  Question SVT. Patient has been started on flecainide.  Patient was also  found to be profoundly neutropenic in the hospital. She also needed Neupogen in the hospital.   Patient is currently home. She is alone.  She has been abe to do her chores by herself. She has been able to go around in the hospital walker.  Her appetite is improving. Chronic mild tingling and numbness. Not any worse. Denies any nausea vomiting. Denies any abdominal pain. Otherwise denies any significant diarrhea. No falls.    REVIEW OF SYSTEMS:  A complete 10 point review of system is done which is negative except mentioned above/history of present illness.   PAST MEDICAL HISTORY :  Past Medical History:  Diagnosis Date  . Chemotherapy-induced peripheral neuropathy ([facility]  . Epistaxis    a. 11/2016 in setting of asa/plavix-->silver nitrate cauterization.  . GI bleed    a. 11/2016 Admission w/ GIB and hypovolemic shock req 3u PRBC's;  b. 11/2016 ECG: gastritis & nonbleeding peptic ulcer; c. 11/2016 Conlonoscopy: rectal and sigmoid colonic ulcers.  . Heart attack ([facility]   a. 1998 Cath @ [facility]: reportedly no intervention required.  . Marland Kitchenerceptin-induced cardiomyopathy ([facility]   a. In the setting of Herceptin Rx for breast cancer (initiated 12/2014); b. 03/2015 MUGA EF 64%; b. 08/2015 MUGA: EF 51%; c. 10/2015 MUGA: EF 44%; d. 11/2015 Echo: EF 45-50%; e. 01/2016 MUGA: EF 60%; f. 06/2016 MUGA EF 65%; g. 10/2016 MUGA: EF 61%;  h. 12/2016 Echo: EF 55-60%, gr1 DD.  . Marland Kitchenyperlipidemia   .  Hypertension   . Neuropathy   . Possible PAD (peripheral artery disease) ([place])    a. 11/2016 LE cyanosis and weak pulses-->CTA w/o significant Ao-BiFem dzs. ? distal dzs-->ASA/Plavix initiated by vascular surgery.  Marland Kitchen PSVT (paroxysmal supraventricular tachycardia) ([place])    a. Dx 11/2016.  . Pulmonary embolism ([place])    a. 12/2016 CTA Chest: small nonocclusive PE in inferior segment of the Left lingula, somewhat eccentric filling defect suggesting chronic rather than acute embolic event; b. 03/3531 LE U/S:  No DVT; c. 12/2016 Echo: Nl RV  fxn, nl PASP.  Marland Kitchen Recurrent Metastatic breast cancer ([place])    a. Dx 2016: Stage II, ER positive, PR positive, HER-2/neu overexpressing of the left breast-->chemo/radiation; b. 10/2016 CT Abd/pelvis: diffuse liver mets, ill defined sclerotic bone lesions-T12;  c. 10/2016 MRI brain: metastatic lesion along L temporal lobe (19x7m) w/ extensive surrounding edema & 517mmidline shift to right.  . Sinus tachycardia     PAST SURGICAL HISTORY :   Past Surgical History:  Procedure Laterality Date  . BREAST BIOPSY Left 2016   Positive  . BREAST LUMPECTOMY WITH SENTINEL LYMPH NODE BIOPSY Left 05/23/2015   Procedure: LEFT BREAST WIDE EXCISION WITH AXILLARY DISSECTION, MASTOPLASTY ;  Surgeon: JeRobert BellowMD;  Location: [facility] ORS;  Service: General;  Laterality: Left;  . BREAST SURGERY Left 12/18/14   breast biopsy/INVASIVE DUCTAL CARCINOMA OF BREAST, NOTTINGHAM GRADE 2.  . Marland KitchenREAST SURGERY  05/23/2015.   Wide excision/mastoplasty, axillary dissection. No residual invasive cancer, positive for residual DCIS. 0/2 nodes identified on axillary dissection. (no SLN by technetium or methylene blue)  . CARDIAC CATHETERIZATION    . COLONOSCOPY WITH PROPOFOL N/A 12/10/2016   Procedure: COLONOSCOPY WITH PROPOFOL;  Surgeon: DaLucilla LameMD;  Location: [facility] ENDOSCOPY;  Service: Endoscopy;  Laterality: N/A;  . ESOPHAGOGASTRODUODENOSCOPY (EGD) WITH PROPOFOL N/A 12/08/2016   Procedure: ESOPHAGOGASTRODUODENOSCOPY (EGD) WITH PROPOFOL;  Surgeon: DaLucilla LameMD;  Location: [facility] ENDOSCOPY;  Service: Endoscopy;  Laterality: N/A;  . PORTACATH PLACEMENT Right 12-31-14   Dr ByBary Castilla  FAMILY HISTORY :   Family History  Problem Relation Age of Onset  . Breast cancer Maternal Aunt   . Breast cancer Cousin   . Brain cancer Maternal Uncle     SOCIAL HISTORY:   Social History  Substance Use Topics  . Smoking status: Light Tobacco Smoker    Packs/day: 0.50    Years: 18.00    Types: Cigarettes  . Smokeless tobacco: Never  Used  . Alcohol use No    ALLERGIES:  is allergic to no known allergies.  MEDICATIONS:  Current Outpatient Prescriptions  Medication Sig Dispense Refill  . anastrozole (ARIMIDEX) 1 MG tablet TAKE 1 TABLET DAILY 90 tablet 3  . aspirin 81 MG tablet Take 81 mg by mouth daily.    . diphenoxylate-atropine (LOMOTIL) 2.5-0.025 MG tablet Take 1 tablet by mouth 4 (four) times daily as needed for diarrhea or loose stools. Take it along with immodium 60 tablet 0  . DULoxetine (CYMBALTA) 60 MG capsule TAKE 1 CAPSULE (60 MG TOTAL) BY MOUTH DAILY. 30 capsule 0  . feeding supplement, ENSURE ENLIVE, (ENSURE ENLIVE) LIQD Take 237 mLs by mouth 2 (two) times daily between meals. 237 mL 12  . flecainide (TAMBOCOR) 50 MG tablet Take 1 tablet (50 mg total) by mouth 2 (two) times daily. 60 tablet 1  . KLOR-CON M20 20 MEQ tablet Take 20 mEq by mouth 2 (two) times daily.  3  . metoprolol tartrate (LOPRESSOR)  25 MG tablet Take 0.5 tablets (12.5 mg total) by mouth 2 (two) times daily. Hold if you're Systolic Blood pressure is less than 100. 90 tablet 3  . ondansetron (ZOFRAN) 8 MG tablet Take 1 tablet (8 mg total) by mouth every 8 (eight) hours as needed for nausea or vomiting (start 3 days; after chemo). 40 tablet 1  . pantoprazole (PROTONIX) 40 MG tablet Take 1 tablet (40 mg total) by mouth 2 (two) times daily. 60 tablet 2  . polyethylene glycol (MIRALAX / GLYCOLAX) packet Take 17 g by mouth daily. 14 each 0  . potassium chloride SA (KLOR-CON M20) 20 MEQ tablet Take one tab twice a day 60 tablet 3  . rivaroxaban (XARELTO) 10 MG TABS tablet Take 1 tablet (10 mg total) by mouth daily. 30 tablet 1   No current facility-administered medications for this visit.    Facility-Administered Medications Ordered in Other Visits  Medication Dose Route Frequency Provider Last Rate Last Dose  . 0.9 %  sodium chloride infusion   Intravenous Continuous [name], MD      . 0.9 %  sodium chloride infusion   Intravenous  Continuous Lloyd Huger, MD 999 mL/hr at 04/24/15 1520    . heparin lock flush 100 unit/mL  500 Units Intravenous Once Dmitriy Berenzon, MD      . sodium chloride 0.9 % injection 10 mL  10 mL Intravenous PRN [name], MD   10 mL at 04/04/15 1440  . sodium chloride flush (NS) 0.9 % injection 10 mL  10 mL Intravenous PRN Dmitriy Berenzon, MD      . Tbo-Filgrastim (GRANIX) injection 480 mcg  480 mcg Subcutaneous Once Cammie Sickle, MD        PHYSICAL EXAMINATION: ECOG PERFORMANCE STATUS: 0 - Asymptomatic  BP 104/70 (BP Location: Right Arm, Patient Position: Sitting)   Pulse 89   Temp 97.8 F (36.6 C) (Tympanic)   Resp 16   Wt 151 lb (68.5 kg)   BMI 24.37 kg/m   Filed Weights   01/28/17 0937  Weight: 151 lb (68.5 kg)    GENERAL: Well-nourished well-developed; Alert, no distress and comfortable.  EYES: no pallor or icterus. She is in a wheelchair.She is alone.  OROPHARYNX: no thrush or ulceration; good dentition  NECK: supple, no masses felt LYMPH:  no palpable lymphadenopathy in the cervical, axillary or inguinal regions LUNGS: clear to auscultation and  No wheeze or crackles HEART/CVS: regular rate & rhythm and no murmurs; 1+ bilateral lower extremity edema.  ABDOMEN:abdomen soft, non-tender and normal bowel sounds Musculoskeletal:no cyanosis of digits and no clubbing  PSYCH: alert & oriented x 3 with fluent speech NEURO: no focal motor/sensory deficits   LABORATORY DATA:  I have reviewed the data as listed    Component Value Date/Time   NA 134 (L) 01/28/2017 0911   NA 135 01/08/2015 0850   K 2.8 (L) 01/28/2017 0911   K 3.7 01/08/2015 0850   CL 99 (L) 01/28/2017 0911   CL 101 01/08/2015 0850   CO2 30 01/28/2017 0911   CO2 26 01/08/2015 0850   GLUCOSE 82 01/28/2017 0911   GLUCOSE 161 (H) 01/08/2015 0850   BUN 8 01/28/2017 0911   BUN 17 01/08/2015 0850   CREATININE 0.40 (L) 01/28/2017 0911   CREATININE 0.82 01/22/2015 1559   CALCIUM 8.2 (L)  01/28/2017 0911   CALCIUM 9.3 01/08/2015 0850   PROT 5.8 (L) 01/28/2017 0911   PROT 6.8 01/22/2015 1559   ALBUMIN 2.3 (  L) 01/28/2017 0911   ALBUMIN 3.9 01/22/2015 1559   AST 46 (H) 01/28/2017 0911   AST 34 01/22/2015 1559   ALT 22 01/28/2017 0911   ALT 42 01/22/2015 1559   ALKPHOS 195 (H) 01/28/2017 0911   ALKPHOS 157 (H) 01/22/2015 1559   BILITOT 1.4 (H) 01/28/2017 0911   BILITOT 0.2 (L) 01/22/2015 1559   GFRNONAA >60 01/28/2017 0911   GFRNONAA >60 01/22/2015 1559   GFRAA >60 01/28/2017 0911   GFRAA >60 01/22/2015 1559    No results found for: SPEP, UPEP  Lab Results  Component Value Date   WBC 2.7 (L) 01/28/2017   NEUTROABS 1.9 01/28/2017   HGB 9.9 (L) 01/28/2017   HCT 29.5 (L) 01/28/2017   MCV 90.3 01/28/2017   PLT 218 01/28/2017      Chemistry      Component Value Date/Time   NA 134 (L) 01/28/2017 0911   NA 135 01/08/2015 0850   K 2.8 (L) 01/28/2017 0911   K 3.7 01/08/2015 0850   CL 99 (L) 01/28/2017 0911   CL 101 01/08/2015 0850   CO2 30 01/28/2017 0911   CO2 26 01/08/2015 0850   BUN 8 01/28/2017 0911   BUN 17 01/08/2015 0850   CREATININE 0.40 (L) 01/28/2017 0911   CREATININE 0.82 01/22/2015 1559      Component Value Date/Time   CALCIUM 8.2 (L) 01/28/2017 0911   CALCIUM 9.3 01/08/2015 0850   ALKPHOS 195 (H) 01/28/2017 0911   ALKPHOS 157 (H) 01/22/2015 1559   AST 46 (H) 01/28/2017 0911   AST 34 01/22/2015 1559   ALT 22 01/28/2017 0911   ALT 42 01/22/2015 1559   BILITOT 1.4 (H) 01/28/2017 0911   BILITOT 0.2 (L) 01/22/2015 1559            LABORATORY DATA:  I have reviewed the data as listed    Component Value Date/Time   NA 134 (L) 01/28/2017 0911   NA 135 01/08/2015 0850   K 2.8 (L) 01/28/2017 0911   K 3.7 01/08/2015 0850   CL 99 (L) 01/28/2017 0911   CL 101 01/08/2015 0850   CO2 30 01/28/2017 0911   CO2 26 01/08/2015 0850   GLUCOSE 82 01/28/2017 0911   GLUCOSE 161 (H) 01/08/2015 0850   BUN 8 01/28/2017 0911   BUN 17 01/08/2015  0850   CREATININE 0.40 (L) 01/28/2017 0911   CREATININE 0.82 01/22/2015 1559   CALCIUM 8.2 (L) 01/28/2017 0911   CALCIUM 9.3 01/08/2015 0850   PROT 5.8 (L) 01/28/2017 0911   PROT 6.8 01/22/2015 1559   ALBUMIN 2.3 (L) 01/28/2017 0911   ALBUMIN 3.9 01/22/2015 1559   AST 46 (H) 01/28/2017 0911   AST 34 01/22/2015 1559   ALT 22 01/28/2017 0911   ALT 42 01/22/2015 1559   ALKPHOS 195 (H) 01/28/2017 0911   ALKPHOS 157 (H) 01/22/2015 1559   BILITOT 1.4 (H) 01/28/2017 0911   BILITOT 0.2 (L) 01/22/2015 1559   GFRNONAA >60 01/28/2017 0911   GFRNONAA >60 01/22/2015 1559   GFRAA >60 01/28/2017 0911   GFRAA >60 01/22/2015 1559    No results found for: SPEP, UPEP  Lab Results  Component Value Date   WBC 2.7 (L) 01/28/2017   NEUTROABS 1.9 01/28/2017   HGB 9.9 (L) 01/28/2017   HCT 29.5 (L) 01/28/2017   MCV 90.3 01/28/2017   PLT 218 01/28/2017      Chemistry      Component Value Date/Time   NA 134 (L)  01/28/2017 0911   NA 135 01/08/2015 0850   K 2.8 (L) 01/28/2017 0911   K 3.7 01/08/2015 0850   CL 99 (L) 01/28/2017 0911   CL 101 01/08/2015 0850   CO2 30 01/28/2017 0911   CO2 26 01/08/2015 0850   BUN 8 01/28/2017 0911   BUN 17 01/08/2015 0850   CREATININE 0.40 (L) 01/28/2017 0911   CREATININE 0.82 01/22/2015 1559      Component Value Date/Time   CALCIUM 8.2 (L) 01/28/2017 0911   CALCIUM 9.3 01/08/2015 0850   ALKPHOS 195 (H) 01/28/2017 0911   ALKPHOS 157 (H) 01/22/2015 1559   AST 46 (H) 01/28/2017 0911   AST 34 01/22/2015 1559   ALT 22 01/28/2017 0911   ALT 42 01/22/2015 1559   BILITOT 1.4 (H) 01/28/2017 0911   BILITOT 0.2 (L) 01/22/2015 1559     IMPRESSION: Normal left ventricular wall motion with estimated ejection fraction of 65%.   Electronically Signed   By: Marijo Sanes M.D.   On: 07/27/2016 09:04  RADIOGRAPHIC STUDIES: I have personally reviewed the radiological images as listed and agreed with the findings in the report. No results found.    ASSESSMENT & PLAN:  Carcinoma of upper-outer quadrant of left breast in female, estrogen receptor positive ([place]) # RECURRENT Metastatic breast cancer- ER/PR positive HER-2/neu negative [status post liver biopsy February 2018; previously HER-2/neu positive]. # s/p  Eribulin day 8   appx  3 week ago. Clinical response noted; however severe neutropenia [C discussion below]  # proceed with cycle #2- day-1. Reduce dose of chemotherapy to 0.8 mg/m on day 1; and 1.2 mg/m on day 8. Add onpro.  # Growth factor-Neulasta/On pro would be given as prophylaxis for chemotherapy-induced neutropenia to prevent febrile neutropenias.  # Brain mets- s/p RT [finished Feb 28th]; symptomatic improvement.   will plan to get MRI in 2-3 months;   # Bilateral lower extremities swelling- dependent edema/steroids/ improved. Continue  leg elevation  # PN G-1-2 Continue neurontin/cymblata.   # Elevated LFTs- secondary to involvement of the liver/ AST ALT improving.  # Hypokalemia- recommend K supp.   # 1 week labs/ chemo eribulin; 3 weeks/labs/chemo.   Orders Placed This Encounter  Procedures  . CBC with Differential    Standing Status:   Future    Standing Expiration Date:   01/28/2018  . Basic metabolic panel    Standing Status:   Future    Standing Expiration Date:   01/28/2018  . CBC with Differential    Standing Status:   Future    Standing Expiration Date:   01/28/2018  . Comprehensive metabolic panel    Standing Status:   Future    Standing Expiration Date:   01/28/2018  . Cancer antigen 27.29    Standing Status:   Future    Standing Expiration Date:   01/28/2018  . CBC with Differential    Standing Status:   Future    Standing Expiration Date:   01/28/2018  . Basic metabolic panel    Standing Status:   Future    Standing Expiration Date:   01/28/2018  . Comprehensive metabolic panel    Standing Status:   Future    Standing Expiration Date:   01/28/2018  . CBC with Differential    Standing Status:    Future    Standing Expiration Date:   01/28/2018  . Cancer antigen 27.29    Standing Status:   Future    Standing Expiration Date:  01/28/2018  . CBC with Differential    Standing Status:   Future    Standing Expiration Date:   01/28/2018  . Basic metabolic panel    Standing Status:   Future    Standing Expiration Date:   01/28/2018   All questions were answered. The patient knows to call the clinic with any problems, questions or concerns.      Cammie Sickle, MD 01/29/2017 6:11 PM

## 2017-01-28 NOTE — Progress Notes (Signed)
Patient here today for follow up.  Patient states no new concerns today  

## 2017-01-29 DIAGNOSIS — C7931 Secondary malignant neoplasm of brain: Secondary | ICD-10-CM | POA: Diagnosis not present

## 2017-01-29 DIAGNOSIS — F17210 Nicotine dependence, cigarettes, uncomplicated: Secondary | ICD-10-CM | POA: Diagnosis not present

## 2017-01-29 DIAGNOSIS — C50912 Malignant neoplasm of unspecified site of left female breast: Secondary | ICD-10-CM | POA: Diagnosis not present

## 2017-01-29 DIAGNOSIS — C7951 Secondary malignant neoplasm of bone: Secondary | ICD-10-CM | POA: Diagnosis not present

## 2017-01-29 DIAGNOSIS — K270 Acute peptic ulcer, site unspecified, with hemorrhage: Secondary | ICD-10-CM | POA: Diagnosis not present

## 2017-01-29 DIAGNOSIS — C787 Secondary malignant neoplasm of liver and intrahepatic bile duct: Secondary | ICD-10-CM | POA: Diagnosis not present

## 2017-01-29 DIAGNOSIS — Z7982 Long term (current) use of aspirin: Secondary | ICD-10-CM | POA: Diagnosis not present

## 2017-01-29 DIAGNOSIS — I10 Essential (primary) hypertension: Secondary | ICD-10-CM | POA: Diagnosis not present

## 2017-01-29 DIAGNOSIS — G620 Drug-induced polyneuropathy: Secondary | ICD-10-CM | POA: Diagnosis not present

## 2017-01-29 LAB — CEA: CEA: 5.8 ng/mL — AB (ref 0.0–4.7)

## 2017-01-29 LAB — CANCER ANTIGEN 27.29: CA 27.29: 92.1 U/mL — ABNORMAL HIGH (ref 0.0–38.6)

## 2017-02-01 NOTE — Telephone Encounter (Signed)
Agree with the order for occupational therapy

## 2017-02-01 NOTE — Telephone Encounter (Signed)
[facility] is still waiting on the verbal orders for this patient. PleasE ADVISE

## 2017-02-02 NOTE — Telephone Encounter (Signed)
Home care notified.

## 2017-02-03 ENCOUNTER — Telehealth: Payer: Self-pay | Admitting: Cardiovascular Disease

## 2017-02-03 DIAGNOSIS — C7951 Secondary malignant neoplasm of bone: Secondary | ICD-10-CM | POA: Diagnosis not present

## 2017-02-03 DIAGNOSIS — K270 Acute peptic ulcer, site unspecified, with hemorrhage: Secondary | ICD-10-CM | POA: Diagnosis not present

## 2017-02-03 DIAGNOSIS — F17210 Nicotine dependence, cigarettes, uncomplicated: Secondary | ICD-10-CM | POA: Diagnosis not present

## 2017-02-03 DIAGNOSIS — C787 Secondary malignant neoplasm of liver and intrahepatic bile duct: Secondary | ICD-10-CM | POA: Diagnosis not present

## 2017-02-03 DIAGNOSIS — I10 Essential (primary) hypertension: Secondary | ICD-10-CM | POA: Diagnosis not present

## 2017-02-03 DIAGNOSIS — Z7982 Long term (current) use of aspirin: Secondary | ICD-10-CM | POA: Diagnosis not present

## 2017-02-03 DIAGNOSIS — C7931 Secondary malignant neoplasm of brain: Secondary | ICD-10-CM | POA: Diagnosis not present

## 2017-02-03 DIAGNOSIS — G620 Drug-induced polyneuropathy: Secondary | ICD-10-CM | POA: Diagnosis not present

## 2017-02-03 DIAGNOSIS — C50912 Malignant neoplasm of unspecified site of left female breast: Secondary | ICD-10-CM | POA: Diagnosis not present

## 2017-02-03 NOTE — Telephone Encounter (Signed)
Left message on machine for patient to contact the office.   

## 2017-02-03 NOTE — Telephone Encounter (Signed)
Pt states he has been having issues with her HR. She states a friend wants to give her a treadmill, she would like to know if this would be ok for her to use a treadmill.

## 2017-02-04 ENCOUNTER — Inpatient Hospital Stay: Payer: BLUE CROSS/BLUE SHIELD

## 2017-02-04 VITALS — BP 123/83 | HR 123 | Temp 98.4°F | Resp 18

## 2017-02-04 DIAGNOSIS — D701 Agranulocytosis secondary to cancer chemotherapy: Secondary | ICD-10-CM

## 2017-02-04 DIAGNOSIS — Z170 Estrogen receptor positive status [ER+]: Secondary | ICD-10-CM | POA: Diagnosis not present

## 2017-02-04 DIAGNOSIS — C50412 Malignant neoplasm of upper-outer quadrant of left female breast: Secondary | ICD-10-CM

## 2017-02-04 DIAGNOSIS — M7989 Other specified soft tissue disorders: Secondary | ICD-10-CM | POA: Diagnosis not present

## 2017-02-04 DIAGNOSIS — M21371 Foot drop, right foot: Secondary | ICD-10-CM | POA: Diagnosis not present

## 2017-02-04 DIAGNOSIS — D709 Neutropenia, unspecified: Secondary | ICD-10-CM | POA: Diagnosis not present

## 2017-02-04 DIAGNOSIS — G629 Polyneuropathy, unspecified: Secondary | ICD-10-CM | POA: Diagnosis not present

## 2017-02-04 DIAGNOSIS — T451X5A Adverse effect of antineoplastic and immunosuppressive drugs, initial encounter: Principal | ICD-10-CM

## 2017-02-04 DIAGNOSIS — I252 Old myocardial infarction: Secondary | ICD-10-CM | POA: Diagnosis not present

## 2017-02-04 DIAGNOSIS — M8580 Other specified disorders of bone density and structure, unspecified site: Secondary | ICD-10-CM | POA: Diagnosis not present

## 2017-02-04 DIAGNOSIS — Z5111 Encounter for antineoplastic chemotherapy: Secondary | ICD-10-CM | POA: Diagnosis not present

## 2017-02-04 DIAGNOSIS — C7931 Secondary malignant neoplasm of brain: Secondary | ICD-10-CM | POA: Diagnosis not present

## 2017-02-04 DIAGNOSIS — R040 Epistaxis: Secondary | ICD-10-CM | POA: Diagnosis not present

## 2017-02-04 DIAGNOSIS — R948 Abnormal results of function studies of other organs and systems: Secondary | ICD-10-CM | POA: Diagnosis not present

## 2017-02-04 DIAGNOSIS — K922 Gastrointestinal hemorrhage, unspecified: Secondary | ICD-10-CM | POA: Diagnosis not present

## 2017-02-04 DIAGNOSIS — E876 Hypokalemia: Secondary | ICD-10-CM | POA: Diagnosis not present

## 2017-02-04 DIAGNOSIS — B370 Candidal stomatitis: Secondary | ICD-10-CM | POA: Diagnosis not present

## 2017-02-04 DIAGNOSIS — Z7689 Persons encountering health services in other specified circumstances: Secondary | ICD-10-CM | POA: Diagnosis not present

## 2017-02-04 LAB — BASIC METABOLIC PANEL
Anion gap: 8 (ref 5–15)
BUN: <5 mg/dL — ABNORMAL LOW (ref 6–20)
CHLORIDE: 99 mmol/L — AB (ref 101–111)
CO2: 26 mmol/L (ref 22–32)
CREATININE: 0.52 mg/dL (ref 0.44–1.00)
Calcium: 8.2 mg/dL — ABNORMAL LOW (ref 8.9–10.3)
GFR calc Af Amer: >60 mL/min (ref >60)
GFR calc non Af Amer: >60 mL/min (ref >60)
Glucose, Bld: 100 mg/dL — ABNORMAL HIGH (ref 65–99)
Potassium: 3.3 mmol/L — ABNORMAL LOW (ref 3.5–5.1)
SODIUM: 133 mmol/L — AB (ref 135–145)

## 2017-02-04 LAB — CBC WITH DIFFERENTIAL/PLATELET
BASOS PCT: 1 %
Basophils Absolute: 0 10*3/uL (ref 0–0.1)
EOS ABS: 0 10*3/uL (ref 0–0.7)
Eosinophils Relative: 0 %
HCT: 30.6 % — ABNORMAL LOW (ref 35.0–47.0)
HEMOGLOBIN: 10.3 g/dL — AB (ref 12.0–16.0)
LYMPHS ABS: 0.3 10*3/uL — AB (ref 1.0–3.6)
Lymphocytes Relative: 34 %
MCH: 30.1 pg (ref 26.0–34.0)
MCHC: 33.6 g/dL (ref 32.0–36.0)
MCV: 89.5 fL (ref 80.0–100.0)
MONO ABS: 0.1 10*3/uL — AB (ref 0.2–0.9)
MONOS PCT: 14 %
NEUTROS PCT: 51 %
Neutro Abs: 0.5 10*3/uL — ABNORMAL LOW (ref 1.4–6.5)
Platelets: 155 10*3/uL (ref 150–440)
RBC: 3.41 MIL/uL — ABNORMAL LOW (ref 3.80–5.20)
RDW: 21.9 % — AB (ref 11.5–14.5)
WBC: 1 10*3/uL — CL (ref 3.6–11.0)

## 2017-02-04 MED ORDER — SODIUM CHLORIDE 0.9 % IV SOLN
1.2000 mg/m2 | Freq: Once | INTRAVENOUS | Status: AC
  Administered 2017-02-04: 2.1 mg via INTRAVENOUS
  Filled 2017-02-04: qty 4.2

## 2017-02-04 MED ORDER — HEPARIN SOD (PORK) LOCK FLUSH 100 UNIT/ML IV SOLN
500.0000 [IU] | Freq: Once | INTRAVENOUS | Status: AC | PRN
Start: 2017-02-04 — End: 2017-02-04
  Administered 2017-02-04: 500 [IU]
  Filled 2017-02-04: qty 5

## 2017-02-04 MED ORDER — PROCHLORPERAZINE MALEATE 10 MG PO TABS
10.0000 mg | ORAL_TABLET | Freq: Once | ORAL | Status: AC
  Administered 2017-02-04: 10 mg via ORAL
  Filled 2017-02-04: qty 1

## 2017-02-04 MED ORDER — SODIUM CHLORIDE 0.9 % IV SOLN
Freq: Once | INTRAVENOUS | Status: AC
  Administered 2017-02-04: 14:00:00 via INTRAVENOUS
  Filled 2017-02-04: qty 1000

## 2017-02-04 MED ORDER — PEGFILGRASTIM 6 MG/0.6ML ~~LOC~~ PSKT
6.0000 mg | PREFILLED_SYRINGE | Freq: Once | SUBCUTANEOUS | Status: AC
  Administered 2017-02-04: 6 mg via SUBCUTANEOUS
  Filled 2017-02-04: qty 0.6

## 2017-02-04 NOTE — Progress Notes (Signed)
Dr. Rogue Bussing gave order to continue with treatment today.  He is aware that [place] is 0.5.

## 2017-02-05 NOTE — Telephone Encounter (Signed)
Left voicemail message to call back  

## 2017-02-08 NOTE — Telephone Encounter (Signed)
Left message on machine for patient to contact the office.   

## 2017-02-08 NOTE — Telephone Encounter (Signed)
She has previously and recently had issues with orthostasis and dehydration. Suspect her soft BP and mildly tachycardic rates are from decreased PO fluid intake/dehydration. I advise no treadmill at this time given her BP and heart rate. Needs to significantly increase water consumption which will likely help with her BP and heart rate. Continue to hold Lopressor for SBP < 100 mmHg.

## 2017-02-08 NOTE — Telephone Encounter (Signed)
Pt returning our call °Please call back ° °

## 2017-02-08 NOTE — Telephone Encounter (Signed)
Reviewed recommendations to increase water intake w/pt who verbalized understanding and agreeable. She will continue to monitor BP and HR and keep next week's OV appt

## 2017-02-08 NOTE — Telephone Encounter (Signed)
S/w pt who would like permission to walk on a treadmill. Pt has hx of elevated HR. She takes metoprolol and flecainide but holds metoprolol if SBP <110. HR has been in the low 100s. She has not been documenting BP but thinks today it was 88/55. Reports feeling lightheaded at that time. .  Recheck while on the phone: BP 98/70, HR 113.  She did not take metoprolol today. Reports feeling fine at this time. Advised pt to refrain from using the treadmill until HR is better controlled. She is scheduled for 5/22 OV. Routed to [employer], PA-C for further recommendations regarding HR and BP.

## 2017-02-11 ENCOUNTER — Telehealth: Payer: Self-pay | Admitting: Family Medicine

## 2017-02-11 ENCOUNTER — Inpatient Hospital Stay
Admission: EM | Admit: 2017-02-11 | Discharge: 2017-02-15 | DRG: 357 | Disposition: A | Discharge status: Home Health | Payer: BLUE CROSS/BLUE SHIELD | Attending: Internal Medicine | Admitting: Internal Medicine

## 2017-02-11 ENCOUNTER — Telehealth: Payer: Self-pay | Admitting: Cardiovascular Disease

## 2017-02-11 ENCOUNTER — Encounter: Payer: Self-pay | Admitting: Emergency Medicine

## 2017-02-11 DIAGNOSIS — C50912 Malignant neoplasm of unspecified site of left female breast: Secondary | ICD-10-CM | POA: Diagnosis not present

## 2017-02-11 DIAGNOSIS — Z79899 Other long term (current) drug therapy: Secondary | ICD-10-CM

## 2017-02-11 DIAGNOSIS — D500 Iron deficiency anemia secondary to blood loss (chronic): Secondary | ICD-10-CM | POA: Diagnosis present

## 2017-02-11 DIAGNOSIS — Z79811 Long term (current) use of aromatase inhibitors: Secondary | ICD-10-CM

## 2017-02-11 DIAGNOSIS — B370 Candidal stomatitis: Secondary | ICD-10-CM | POA: Diagnosis not present

## 2017-02-11 DIAGNOSIS — D122 Benign neoplasm of ascending colon: Secondary | ICD-10-CM | POA: Diagnosis not present

## 2017-02-11 DIAGNOSIS — D123 Benign neoplasm of transverse colon: Secondary | ICD-10-CM

## 2017-02-11 DIAGNOSIS — F17210 Nicotine dependence, cigarettes, uncomplicated: Secondary | ICD-10-CM | POA: Diagnosis present

## 2017-02-11 DIAGNOSIS — Z7982 Long term (current) use of aspirin: Secondary | ICD-10-CM | POA: Diagnosis not present

## 2017-02-11 DIAGNOSIS — C7951 Secondary malignant neoplasm of bone: Secondary | ICD-10-CM | POA: Diagnosis present

## 2017-02-11 DIAGNOSIS — Z923 Personal history of irradiation: Secondary | ICD-10-CM

## 2017-02-11 DIAGNOSIS — M21371 Foot drop, right foot: Secondary | ICD-10-CM | POA: Diagnosis not present

## 2017-02-11 DIAGNOSIS — I252 Old myocardial infarction: Secondary | ICD-10-CM | POA: Diagnosis not present

## 2017-02-11 DIAGNOSIS — R29898 Other symptoms and signs involving the musculoskeletal system: Secondary | ICD-10-CM | POA: Diagnosis not present

## 2017-02-11 DIAGNOSIS — D649 Anemia, unspecified: Secondary | ICD-10-CM | POA: Diagnosis not present

## 2017-02-11 DIAGNOSIS — E7800 Pure hypercholesterolemia, unspecified: Secondary | ICD-10-CM | POA: Diagnosis present

## 2017-02-11 DIAGNOSIS — E785 Hyperlipidemia, unspecified: Secondary | ICD-10-CM | POA: Diagnosis present

## 2017-02-11 DIAGNOSIS — I10 Essential (primary) hypertension: Secondary | ICD-10-CM | POA: Diagnosis present

## 2017-02-11 DIAGNOSIS — I248 Other forms of acute ischemic heart disease: Secondary | ICD-10-CM | POA: Diagnosis present

## 2017-02-11 DIAGNOSIS — D125 Benign neoplasm of sigmoid colon: Secondary | ICD-10-CM | POA: Diagnosis not present

## 2017-02-11 DIAGNOSIS — Z86711 Personal history of pulmonary embolism: Secondary | ICD-10-CM

## 2017-02-11 DIAGNOSIS — C7931 Secondary malignant neoplasm of brain: Secondary | ICD-10-CM | POA: Diagnosis present

## 2017-02-11 DIAGNOSIS — C787 Secondary malignant neoplasm of liver and intrahepatic bile duct: Secondary | ICD-10-CM | POA: Diagnosis present

## 2017-02-11 DIAGNOSIS — Z170 Estrogen receptor positive status [ER+]: Secondary | ICD-10-CM

## 2017-02-11 DIAGNOSIS — I471 Supraventricular tachycardia: Secondary | ICD-10-CM | POA: Diagnosis not present

## 2017-02-11 DIAGNOSIS — E876 Hypokalemia: Secondary | ICD-10-CM | POA: Diagnosis not present

## 2017-02-11 DIAGNOSIS — R531 Weakness: Secondary | ICD-10-CM | POA: Diagnosis not present

## 2017-02-11 DIAGNOSIS — K922 Gastrointestinal hemorrhage, unspecified: Secondary | ICD-10-CM | POA: Diagnosis not present

## 2017-02-11 DIAGNOSIS — Z803 Family history of malignant neoplasm of breast: Secondary | ICD-10-CM | POA: Diagnosis not present

## 2017-02-11 DIAGNOSIS — K270 Acute peptic ulcer, site unspecified, with hemorrhage: Secondary | ICD-10-CM | POA: Diagnosis not present

## 2017-02-11 DIAGNOSIS — C50919 Malignant neoplasm of unspecified site of unspecified female breast: Secondary | ICD-10-CM | POA: Diagnosis present

## 2017-02-11 DIAGNOSIS — D62 Acute posthemorrhagic anemia: Secondary | ICD-10-CM | POA: Diagnosis not present

## 2017-02-11 DIAGNOSIS — Z9221 Personal history of antineoplastic chemotherapy: Secondary | ICD-10-CM | POA: Diagnosis not present

## 2017-02-11 DIAGNOSIS — K921 Melena: Secondary | ICD-10-CM

## 2017-02-11 DIAGNOSIS — B379 Candidiasis, unspecified: Secondary | ICD-10-CM | POA: Diagnosis not present

## 2017-02-11 DIAGNOSIS — K640 First degree hemorrhoids: Secondary | ICD-10-CM | POA: Diagnosis present

## 2017-02-11 DIAGNOSIS — G620 Drug-induced polyneuropathy: Secondary | ICD-10-CM | POA: Diagnosis not present

## 2017-02-11 DIAGNOSIS — I959 Hypotension, unspecified: Secondary | ICD-10-CM | POA: Diagnosis present

## 2017-02-11 DIAGNOSIS — K635 Polyp of colon: Secondary | ICD-10-CM

## 2017-02-11 DIAGNOSIS — I2699 Other pulmonary embolism without acute cor pulmonale: Secondary | ICD-10-CM | POA: Diagnosis not present

## 2017-02-11 DIAGNOSIS — Z7901 Long term (current) use of anticoagulants: Secondary | ICD-10-CM | POA: Diagnosis not present

## 2017-02-11 DIAGNOSIS — C50412 Malignant neoplasm of upper-outer quadrant of left female breast: Secondary | ICD-10-CM | POA: Diagnosis not present

## 2017-02-11 DIAGNOSIS — M4807 Spinal stenosis, lumbosacral region: Secondary | ICD-10-CM | POA: Diagnosis not present

## 2017-02-11 DIAGNOSIS — G629 Polyneuropathy, unspecified: Secondary | ICD-10-CM | POA: Diagnosis not present

## 2017-02-11 LAB — CBC
HCT: 33 % — ABNORMAL LOW (ref 35.0–47.0)
Hemoglobin: 11.1 g/dL — ABNORMAL LOW (ref 12.0–16.0)
MCH: 30.5 pg (ref 26.0–34.0)
MCHC: 33.7 g/dL (ref 32.0–36.0)
MCV: 90.5 fL (ref 80.0–100.0)
PLATELETS: 108 10*3/uL — AB (ref 150–440)
RBC: 3.64 MIL/uL — ABNORMAL LOW (ref 3.80–5.20)
RDW: 21.9 % — AB (ref 11.5–14.5)
WBC: 8.6 10*3/uL (ref 3.6–11.0)

## 2017-02-11 LAB — COMPREHENSIVE METABOLIC PANEL
ALBUMIN: 2.6 g/dL — AB (ref 3.5–5.0)
ALK PHOS: 319 U/L — AB (ref 38–126)
ALT: 44 U/L (ref 14–54)
ANION GAP: 14 (ref 5–15)
AST: 142 U/L — AB (ref 15–41)
BILIRUBIN TOTAL: 2.7 mg/dL — AB (ref 0.3–1.2)
BUN: 5 mg/dL — AB (ref 6–20)
CALCIUM: 8.4 mg/dL — AB (ref 8.9–10.3)
CO2: 22 mmol/L (ref 22–32)
CREATININE: 0.76 mg/dL (ref 0.44–1.00)
Chloride: 96 mmol/L — ABNORMAL LOW (ref 101–111)
GFR calc Af Amer: >60 mL/min (ref >60)
GFR calc non Af Amer: >60 mL/min (ref >60)
Glucose, Bld: 91 mg/dL (ref 65–99)
Potassium: 3.6 mmol/L (ref 3.5–5.1)
SODIUM: 132 mmol/L — AB (ref 135–145)
TOTAL PROTEIN: 6.4 g/dL — AB (ref 6.5–8.1)

## 2017-02-11 LAB — TYPE AND SCREEN
ABO/RH(D): A POS
Antibody Screen: NEGATIVE

## 2017-02-11 LAB — TROPONIN I: Troponin I: 0.06 ng/mL (ref <0.03)

## 2017-02-11 LAB — PROTIME-INR
INR: 3.07
Prothrombin Time: 32.4 seconds — ABNORMAL HIGH (ref 11.4–15.2)

## 2017-02-11 MED ORDER — DULOXETINE HCL 30 MG PO CPEP
60.0000 mg | ORAL_CAPSULE | Freq: Every day | ORAL | Status: DC
  Administered 2017-02-12 – 2017-02-14 (×3): 60 mg via ORAL
  Filled 2017-02-11 (×3): qty 2

## 2017-02-11 MED ORDER — SODIUM CHLORIDE 0.9 % IV SOLN
Freq: Once | INTRAVENOUS | Status: AC
  Administered 2017-02-11: 21:00:00 via INTRAVENOUS

## 2017-02-11 MED ORDER — ACETAMINOPHEN 325 MG PO TABS: 650.0000 mg | ORAL_TABLET | Freq: Four times a day (QID) | ORAL | Status: DC | PRN

## 2017-02-11 MED ORDER — ACETAMINOPHEN 650 MG RE SUPP
650.0000 mg | Freq: Four times a day (QID) | RECTAL | Status: DC | PRN
Start: 2017-02-11 — End: 2017-02-15

## 2017-02-11 MED ORDER — PANTOPRAZOLE SODIUM 40 MG PO TBEC
40.0000 mg | DELAYED_RELEASE_TABLET | Freq: Two times a day (BID) | ORAL | Status: DC
  Administered 2017-02-11 – 2017-02-14 (×7): 40 mg via ORAL
  Filled 2017-02-11 (×7): qty 1

## 2017-02-11 MED ORDER — SODIUM CHLORIDE 0.9 % IV SOLN: INTRAVENOUS | Status: DC

## 2017-02-11 MED ORDER — SODIUM CHLORIDE 0.9 % IV SOLN
INTRAVENOUS | Status: AC
  Administered 2017-02-11: 23:00:00 via INTRAVENOUS

## 2017-02-11 MED ORDER — ONDANSETRON HCL 4 MG PO TABS: 4.0000 mg | ORAL_TABLET | Freq: Four times a day (QID) | ORAL | Status: DC | PRN

## 2017-02-11 MED ORDER — ANASTROZOLE 1 MG PO TABS
1.0000 mg | ORAL_TABLET | Freq: Every day | ORAL | Status: DC
  Administered 2017-02-12 – 2017-02-14 (×3): 1 mg via ORAL
  Filled 2017-02-11 (×3): qty 1

## 2017-02-11 MED ORDER — ONDANSETRON HCL 4 MG/2ML IJ SOLN: 4.0000 mg | Freq: Four times a day (QID) | INTRAMUSCULAR | Status: DC | PRN

## 2017-02-11 MED ORDER — FLECAINIDE ACETATE 50 MG PO TABS
50.0000 mg | ORAL_TABLET | Freq: Two times a day (BID) | ORAL | Status: DC
  Administered 2017-02-11 – 2017-02-14 (×7): 50 mg via ORAL
  Filled 2017-02-11 (×9): qty 1

## 2017-02-11 NOTE — ED Triage Notes (Signed)
Pt reports low blood pressure and noticed dark red blood in her stool today.

## 2017-02-11 NOTE — ED Provider Notes (Addendum)
[facility] Emergency Department Provider Note  ____________________________________________   I have reviewed the triage vital signs and the nursing notes.   HISTORY  Chief Complaint Hypotension and Blood In Stools    HPI Sarah Horne is a 61 y.o. female who presents today complaining of having passed bright red blood per rectum 1 today feeling dizzy and lightheaded, she was recently started on Xarelto for a blood clot. She has no abdominal pain. She does have a history of GI bleeds in the past. Nothing makes it better nothing makes it worse. She denies any alleviating or aggravating symptoms. Baseline blood pressures in the low 100s. Patient states that she has had no hematemesis.      Past Medical History:  Diagnosis Date  . Chemotherapy-induced peripheral neuropathy ([place])   . Epistaxis    a. 11/2016 in setting of asa/plavix-->silver nitrate cauterization.  . GI bleed    a. 11/2016 Admission w/ GIB and hypovolemic shock req 3u PRBC's;  b. 11/2016 ECG: gastritis & nonbleeding peptic ulcer; c. 11/2016 Conlonoscopy: rectal and sigmoid colonic ulcers.  . Heart attack ([place])    a. 1998 Cath @ [facility]: reportedly no intervention required.  Marland Kitchen Herceptin-induced cardiomyopathy ([place])    a. In the setting of Herceptin Rx for breast cancer (initiated 12/2014); b. 03/2015 MUGA EF 64%; b. 08/2015 MUGA: EF 51%; c. 10/2015 MUGA: EF 44%; d. 11/2015 Echo: EF 45-50%; e. 01/2016 MUGA: EF 60%; f. 06/2016 MUGA EF 65%; g. 10/2016 MUGA: EF 61%;  h. 12/2016 Echo: EF 55-60%, gr1 DD.  Marland Kitchen Hyperlipidemia   . Hypertension   . Neuropathy   . Possible PAD (peripheral artery disease) ([place])    a. 11/2016 LE cyanosis and weak pulses-->CTA w/o significant Ao-BiFem dzs. ? distal dzs-->ASA/Plavix initiated by vascular surgery.  Marland Kitchen PSVT (paroxysmal supraventricular tachycardia) ([place])    a. Dx 11/2016.  . Pulmonary embolism ([place])    a. 12/2016 CTA Chest: small nonocclusive PE in inferior segment of the  Left lingula, somewhat eccentric filling defect suggesting chronic rather than acute embolic event; b. 09/6107 LE U/S:  No DVT; c. 12/2016 Echo: Nl RV fxn, nl PASP.  Marland Kitchen Recurrent Metastatic breast cancer ([place])    a. Dx 2016: Stage II, ER positive, PR positive, HER-2/neu overexpressing of the left breast-->chemo/radiation; b. 10/2016 CT Abd/pelvis: diffuse liver mets, ill defined sclerotic bone lesions-T12;  c. 10/2016 MRI brain: metastatic lesion along L temporal lobe (19x53m) w/ extensive surrounding edema & 569mmidline shift to right.  . Sinus tachycardia     Patient Active Problem List   Diagnosis Date Noted  . Metastatic breast cancer (HCBurnside04/23/2018  . Orthostatic lightheadedness 01/14/2017  . Atrial tachycardia (HCCoal [place]04/19/2018  . Hypokalemia 01/14/2017  . Pulmonary emboli (HCFort Mohave04/17/2018  . Dehydration 12/25/2016  . Blood loss anemia 12/15/2016  . Ulceration of colon   . Heartburn   . Duodenitis   . Gastritis without bleeding   . Blood in stool   . Acute esophagitis   . Acute esophagogastric ulcer   . Rectal bleed   . Hypovolemic shock (HCFerndale  . Ischemic leg 12/04/2016  . Chemotherapy induced neutropenia (HCMahnomen02/23/2018  . Palliative care by specialist   . Goals of care, counseling/discussion   . DNR (do not resuscitate) discussion   . Cerebral edema (HCCantrall02/13/2018  . Encounter for monitoring cardiotoxic drug therapy 11/09/2016  . Elevated LFTs 10/20/2016  . URI with cough and congestion 08/25/2016  . Peripheral neuropathy due to  chemotherapy ([place]) 03/13/2016  . Encounter for antineoplastic chemotherapy 03/13/2016  . Hyperlipidemia, unspecified 01/29/2016  . Hyperglycemia, unspecified 01/29/2016  . Cardiomyopathy ([place]) 01/13/2016  . Hypertension 07/18/2015  . Carcinoma of upper-outer quadrant of left breast in female, estrogen receptor positive ([place]) 12/27/2014    Past Surgical History:  Procedure Laterality Date  . BREAST BIOPSY Left 2016   Positive  . BREAST  LUMPECTOMY WITH SENTINEL LYMPH NODE BIOPSY Left 05/23/2015   Procedure: LEFT BREAST WIDE EXCISION WITH AXILLARY DISSECTION, MASTOPLASTY ;  Surgeon: [name], MD;  Location: [facility] ORS;  Service: General;  Laterality: Left;  . BREAST SURGERY Left 12/18/14   breast biopsy/INVASIVE DUCTAL CARCINOMA OF BREAST, NOTTINGHAM GRADE 2.  Marland Kitchen BREAST SURGERY  05/23/2015.   Wide excision/mastoplasty, axillary dissection. No residual invasive cancer, positive for residual DCIS. 0/2 nodes identified on axillary dissection. (no SLN by technetium or methylene blue)  . CARDIAC CATHETERIZATION    . COLONOSCOPY WITH PROPOFOL N/A 12/10/2016   Procedure: COLONOSCOPY WITH PROPOFOL;  Surgeon: Lucilla Lame, MD;  Location: [facility] ENDOSCOPY;  Service: Endoscopy;  Laterality: N/A;  . ESOPHAGOGASTRODUODENOSCOPY (EGD) WITH PROPOFOL N/A 12/08/2016   Procedure: ESOPHAGOGASTRODUODENOSCOPY (EGD) WITH PROPOFOL;  Surgeon: Lucilla Lame, MD;  Location: [facility] ENDOSCOPY;  Service: Endoscopy;  Laterality: N/A;  . PORTACATH PLACEMENT Right 12-31-14   Dr Bary Castilla    Prior to Admission medications   Medication Sig Start Date End Date Taking? Authorizing Provider  anastrozole (ARIMIDEX) 1 MG tablet TAKE 1 TABLET DAILY 11/04/16   Cammie Sickle, MD  aspirin 81 MG tablet Take 81 mg by mouth daily.    [provider]  diphenoxylate-atropine (LOMOTIL) 2.5-0.025 MG tablet Take 1 tablet by mouth 4 (four) times daily as needed for diarrhea or loose stools. Take it along with immodium 09/07/16   Cammie Sickle, MD  DULoxetine (CYMBALTA) 60 MG capsule TAKE 1 CAPSULE (60 MG TOTAL) BY MOUTH DAILY. 12/23/16   Cammie Sickle, MD  feeding supplement, ENSURE ENLIVE, (ENSURE ENLIVE) LIQD Take 237 mLs by mouth 2 (two) times daily between meals. 12/07/16   Fritzi Mandes, MD  flecainide (TAMBOCOR) 50 MG tablet Take 1 tablet (50 mg total) by mouth 2 (two) times daily. 01/16/17   Sainani, Belia Heman, MD  KLOR-CON M20 20 MEQ tablet Take 20 mEq by  mouth 2 (two) times daily. 11/26/16   [provider]  metoprolol tartrate (LOPRESSOR) 25 MG tablet Take 0.5 tablets (12.5 mg total) by mouth 2 (two) times daily. Hold if you're Systolic Blood pressure is less than 100. 01/07/17   Dunn, Areta Haber, PA-C  ondansetron (ZOFRAN) 8 MG tablet Take 1 tablet (8 mg total) by mouth every 8 (eight) hours as needed for nausea or vomiting (start 3 days; after chemo). 11/26/16   Cammie Sickle, MD  pantoprazole (PROTONIX) 40 MG tablet Take 1 tablet (40 mg total) by mouth 2 (two) times daily. 12/11/16   Fritzi Mandes, MD  polyethylene glycol [facility] / Floria Raveling) packet Take 17 g by mouth daily. 12/07/16   Fritzi Mandes, MD  potassium chloride SA (KLOR-CON M20) 20 MEQ tablet Take one tab twice a day 01/28/17   Cammie Sickle, MD  rivaroxaban (XARELTO) 10 MG TABS tablet Take 1 tablet (10 mg total) by mouth daily. 01/16/17   Henreitta Leber, MD    Allergies No known allergies  Family History  Problem Relation Age of Onset  . Breast cancer Maternal Aunt   . Breast cancer Cousin   . Brain  cancer Maternal Uncle     Social History Social History  Substance Use Topics  . Smoking status: Light Tobacco Smoker    Packs/day: 0.50    Years: 18.00    Types: Cigarettes  . Smokeless tobacco: Never Used  . Alcohol use No    Review of Systems Constitutional: No fever/chills Eyes: No visual changes. ENT: No sore throat. No stiff neck no neck pain Cardiovascular: Denies chest pain. Respiratory: Denies shortness of breath. Gastrointestinal:   no vomiting.  Positive rectal bleeding diarrhea.  No constipation. Genitourinary: Negative for dysuria. Musculoskeletal: Negative lower extremity swelling Skin: Negative for rash. Neurological: Negative for severe headaches, focal weakness or numbness. 10-point ROS otherwise negative.  ____________________________________________   PHYSICAL EXAM:  VITAL SIGNS: ED Triage Vitals [02/11/17 1854]  Enc Vitals  Group     BP (!) 89/58     Pulse Rate (!) 135     Resp 18     Temp 97.5 F (36.4 C)     Temp Source Oral     SpO2 99 %     Weight 151 lb (68.5 kg)     Height 5' 6" (1.676 m)     Head Circumference      Peak Flow      Pain Score 0     Pain Loc      Pain Edu?      Excl. in [place]?     Constitutional: Alert and oriented. Well appearing and in no acute distress. Eyes: Conjunctivae are normal. PERRL. EOMI. Head: Atraumatic. Nose: No congestion/rhinnorhea. Mouth/Throat: Mucous membranes are moist.  Oropharynx non-erythematous. Neck: No stridor.   Nontender with no meningismus Cardiovascular: Tachycardia noted, regular rhythm. Grossly normal heart sounds.  Good peripheral circulation. Respiratory: Normal respiratory effort.  No retractions. Lungs CTAB. Abdominal: Soft and nontender. No distention. No guarding no rebound Back:  There is no focal tenderness or step off.  there is no midline tenderness there are no lesions noted. there is no CVA tenderness Rectal exam: Female nurse present, bloody stool noted. Guaiac positive Musculoskeletal: No lower extremity tenderness, no upper extremity tenderness. No joint effusions, no DVT signs strong distal pulses no edema Neurologic:  Normal speech and language. No gross focal neurologic deficits are appreciated.  Skin:  Skin is warm, dry and intact. No rash noted. Psychiatric: Mood and affect are normal. Speech and behavior are normal.  ____________________________________________   LABS (all labs ordered are listed, but only abnormal results are displayed)  Labs Reviewed  CBC - Abnormal; Notable for the following:       Result Value   RBC 3.64 (*)    Hemoglobin 11.1 (*)    HCT 33.0 (*)    RDW 21.9 (*)    Platelets 108 (*)    All other components within normal limits  PROTIME-INR - Abnormal; Notable for the following:    Prothrombin Time 32.4 (*)    All other components within normal limits  COMPREHENSIVE METABOLIC PANEL  TROPONIN I   POC OCCULT BLOOD, ED  TYPE AND SCREEN  TYPE AND SCREEN   ____________________________________________  EKG  I personally interpreted any EKGs ordered by me or triage *Sinus tach rate 134, no acute ischemic changes, normal axis. ____________________________________________  [id number]  I reviewed any imaging ordered by me or triage that were performed during my shift and, if possible, patient and/or family made aware of any abnormal findings. ____________________________________________   PROCEDURES  Procedure(s) performed: None  Procedures  Critical Care performed: CRITICAL CARE  Performed by: Schuyler Amor   Total critical care time: 40 minutes  Critical care time was exclusive of separately billable procedures and treating other patients.  Critical care was necessary to treat or prevent imminent or life-threatening deterioration.  Critical care was time spent personally by me on the following activities: development of treatment plan with patient and/or surrogate as well as nursing, discussions with consultants, evaluation of patient's response to treatment, examination of patient, obtaining history from patient or surrogate, ordering and performing treatments and interventions, ordering and review of laboratory studies, ordering and review of radiographic studies, pulse oximetry and re-evaluation of patient's condition.   ____________________________________________   INITIAL IMPRESSION / ASSESSMENT AND PLAN / ED COURSE  Pertinent labs & imaging results that were available during my care of the patient were reviewed by me and considered in my medical decision making (see chart for details).  Patient with an acute GI bleed on blood thinners. Blood pressure at this time is 119 although she was 89 in the waiting room. giving her IV fluid she will probably require blood. Given her tachycardia she will certainly need admission to the hospital. Type and screen is pending.  We will discuss with hospitalist service admission.  ----------------------------------------- 9:37 PM on 02/11/2017 -----------------------------------------  D/w dr. Allen Norris who agrees w/ mgt and will follow.  Dr. Jannifer Franklin is admitting.       ____________________________________________   FINAL CLINICAL IMPRESSION(S) / ED DIAGNOSES  Final diagnoses:  None      This chart was dictated using voice recognition software.  Despite best efforts to proofread,  errors can occur which can change meaning.      Schuyler Amor, MD 02/11/17 2049    Schuyler Amor, MD 02/11/17 2138

## 2017-02-11 NOTE — Telephone Encounter (Signed)
Spoke with Mardene Celeste, who reports that patient's heart rate is in the 130s, blood pressure is 100s over 60s, she is experiencing right foot drop and is generally feeling very weak. I have recommended to her that she should be sent to the ER ASAP and Mardene Celeste verbalized agreement.

## 2017-02-11 NOTE — Telephone Encounter (Signed)
Returned call to Mardene Celeste, [facility] Nurse. She has just left from patient's home. Patient's heart rate was 135, BP 100/60 while at her visit today. 5/15  HR 99 5/16 HR 107 Today HR 135  BP 100/60 Patient has not taken metoprolol in 3 days because SBP<100. Nurse reports patient's right foot has more foot drop and she is dragging it more.  Nurse is going to call the cancer doctor to see status of cancer. Patient reported to nurse she has felt dizziness 2-3 times over the past 2 days where her legs feel "like jelly" so she sits down and then feels better. Will call patient to check on her, then route call for advice.

## 2017-02-11 NOTE — Telephone Encounter (Signed)
Contacted Sarah Horne with [facility] and recommendations given to her as well who verbalized understanding. Patient also aware to continue holding metoprolol for SBP <100.

## 2017-02-11 NOTE — Telephone Encounter (Signed)
Called patient. Today, patient denies lightheadedness, dizziness, chest pain, SOB or palpitations. States yesterday she got lightheaded and felt like she would pass out on 2 occasions. 5/15 107/66 5/16 88/65, HR 120;  97/70 HR 100;  101/80, HR 108 5/17 96/70, HR 142; 100/60, HR 135  Spoke with Christell Faith, PA regarding patient. He advised for patient to continue to increase intake of fluids. Patient has f/u appt with Dr Fletcher Anon on 02/16/17.  Patient verbalized understanding and says she's been drinking a lot of water. Patient aware of upcoming appt date and time. She will contact us or call 911 if any new or worsening symptoms.

## 2017-02-11 NOTE — Telephone Encounter (Signed)
Pt is experiencing increased weakness in right arm and both legs, possible dehydration

## 2017-02-11 NOTE — ED Notes (Addendum)
Reviewed pt's chart; noted vs & cc; charge nurse called immed and notified; pt brought to triage for reassessment while awaiting exam room, noted to have cup of water drinking and instructed not to eat or drink further; pt reports bright red rectal bleeding noted today; st hx of same with transfusion; denies any abd pain but reports dizziness last few days; pt pale in color; EKG performed and troponin added and will redraw labs per lab request

## 2017-02-11 NOTE — Telephone Encounter (Signed)
Mardene Celeste called because the patient's heart rate is 135 and her blood pressure is 100/60 without medication. Mardene Celeste wondering if anything needed to be done about the heart rate. She can be reached at [phone]. Please advise.

## 2017-02-11 NOTE — Telephone Encounter (Signed)
Sarah Horne from [facility] would like a call back on behalf of patient. [phone]

## 2017-02-11 NOTE — ED Notes (Signed)
Attempted to access port with [place] RN. Unsuccessful x1. Agricultural consultant notified.

## 2017-02-11 NOTE — Telephone Encounter (Signed)
Routed to Dr. Joesph July from [facility] would like a call back on behalf of patient. [phone], asking for re-cert, please return call .

## 2017-02-12 ENCOUNTER — Inpatient Hospital Stay: Payer: BLUE CROSS/BLUE SHIELD

## 2017-02-12 DIAGNOSIS — Z923 Personal history of irradiation: Secondary | ICD-10-CM

## 2017-02-12 DIAGNOSIS — Z86718 Personal history of other venous thrombosis and embolism: Secondary | ICD-10-CM

## 2017-02-12 DIAGNOSIS — Z170 Estrogen receptor positive status [ER+]: Secondary | ICD-10-CM

## 2017-02-12 DIAGNOSIS — Z808 Family history of malignant neoplasm of other organs or systems: Secondary | ICD-10-CM

## 2017-02-12 DIAGNOSIS — Z79811 Long term (current) use of aromatase inhibitors: Secondary | ICD-10-CM

## 2017-02-12 DIAGNOSIS — F17210 Nicotine dependence, cigarettes, uncomplicated: Secondary | ICD-10-CM

## 2017-02-12 DIAGNOSIS — C7931 Secondary malignant neoplasm of brain: Secondary | ICD-10-CM

## 2017-02-12 DIAGNOSIS — I471 Supraventricular tachycardia: Secondary | ICD-10-CM

## 2017-02-12 DIAGNOSIS — E785 Hyperlipidemia, unspecified: Secondary | ICD-10-CM

## 2017-02-12 DIAGNOSIS — I10 Essential (primary) hypertension: Secondary | ICD-10-CM

## 2017-02-12 DIAGNOSIS — M21371 Foot drop, right foot: Secondary | ICD-10-CM

## 2017-02-12 DIAGNOSIS — I252 Old myocardial infarction: Secondary | ICD-10-CM

## 2017-02-12 DIAGNOSIS — K922 Gastrointestinal hemorrhage, unspecified: Secondary | ICD-10-CM

## 2017-02-12 DIAGNOSIS — G629 Polyneuropathy, unspecified: Secondary | ICD-10-CM

## 2017-02-12 DIAGNOSIS — C50412 Malignant neoplasm of upper-outer quadrant of left female breast: Secondary | ICD-10-CM

## 2017-02-12 DIAGNOSIS — Z79899 Other long term (current) drug therapy: Secondary | ICD-10-CM

## 2017-02-12 DIAGNOSIS — Z803 Family history of malignant neoplasm of breast: Secondary | ICD-10-CM

## 2017-02-12 DIAGNOSIS — R29898 Other symptoms and signs involving the musculoskeletal system: Secondary | ICD-10-CM

## 2017-02-12 DIAGNOSIS — I959 Hypotension, unspecified: Secondary | ICD-10-CM

## 2017-02-12 DIAGNOSIS — Z8711 Personal history of peptic ulcer disease: Secondary | ICD-10-CM

## 2017-02-12 LAB — CBC
HCT: 27.8 % — ABNORMAL LOW (ref 35.0–47.0)
HEMOGLOBIN: 9.1 g/dL — AB (ref 12.0–16.0)
MCH: 29.1 pg (ref 26.0–34.0)
MCHC: 32.7 g/dL (ref 32.0–36.0)
MCV: 89.1 fL (ref 80.0–100.0)
Platelets: 81 10*3/uL — ABNORMAL LOW (ref 150–440)
RBC: 3.12 MIL/uL — AB (ref 3.80–5.20)
RDW: 22.3 % — ABNORMAL HIGH (ref 11.5–14.5)
WBC: 11.7 10*3/uL — ABNORMAL HIGH (ref 3.6–11.0)

## 2017-02-12 LAB — TROPONIN I
TROPONIN I: 0.03 ng/mL — AB (ref <0.03)
Troponin I: 0.03 ng/mL (ref <0.03)

## 2017-02-12 LAB — BASIC METABOLIC PANEL
ANION GAP: 10 (ref 5–15)
BUN: 6 mg/dL (ref 6–20)
CO2: 25 mmol/L (ref 22–32)
Calcium: 7.9 mg/dL — ABNORMAL LOW (ref 8.9–10.3)
Chloride: 100 mmol/L — ABNORMAL LOW (ref 101–111)
Creatinine, Ser: 0.75 mg/dL (ref 0.44–1.00)
GFR calc non Af Amer: >60 mL/min (ref >60)
Glucose, Bld: 79 mg/dL (ref 65–99)
Potassium: 3.1 mmol/L — ABNORMAL LOW (ref 3.5–5.1)
Sodium: 135 mmol/L (ref 135–145)

## 2017-02-12 LAB — MAGNESIUM: Magnesium: 1.3 mg/dL — ABNORMAL LOW (ref 1.7–2.4)

## 2017-02-12 MED ORDER — MAGNESIUM SULFATE 4 GM/100ML IV SOLN
4.0000 g | Freq: Once | INTRAVENOUS | Status: AC
  Administered 2017-02-12: 13:00:00 4 g via INTRAVENOUS
  Filled 2017-02-12: qty 100

## 2017-02-12 MED ORDER — POTASSIUM CHLORIDE CRYS ER 20 MEQ PO TBCR
40.0000 meq | EXTENDED_RELEASE_TABLET | Freq: Once | ORAL | Status: AC
  Administered 2017-02-12: 40 meq via ORAL
  Filled 2017-02-12: qty 2

## 2017-02-12 MED ORDER — POLYETHYLENE GLYCOL 3350 17 GM/SCOOP PO POWD
1.0000 | Freq: Once | ORAL | Status: AC
  Administered 2017-02-12: 255 g via ORAL
  Filled 2017-02-12: qty 255

## 2017-02-12 MED ORDER — POTASSIUM CHLORIDE CRYS ER 20 MEQ PO TBCR: 20.0000 meq | EXTENDED_RELEASE_TABLET | Freq: Two times a day (BID) | ORAL | Status: DC

## 2017-02-12 MED ORDER — SODIUM CHLORIDE 0.9 % IV SOLN
1.0000 g | Freq: Once | INTRAVENOUS | Status: AC
  Administered 2017-02-12: 03:00:00 1 g via INTRAVENOUS
  Filled 2017-02-12: qty 10

## 2017-02-12 NOTE — Consult Note (Signed)
Sarah Lame, MD [facility]  [address], [place], [place] [address] Phone: [phone] Fax : [phone]  Consultation  Referring Provider:     Dr. Benjie Horne Primary Care Physician:  Sarah Nova, MD Primary Gastroenterologist:  Dr. Allen Horne         Reason for Consultation:     Rectal bleeding  Date of Admission:  02/11/2017 Date of Consultation:  02/12/2017         HPI:   MERCER Horne is a 61 y.o. female Burnis Medin has a history of a PE and is on anticoagulation.  The patient also has metastatic breast cancer.  She was admitted previously with a lower GI bleed and was found to have 2 ulcers in the lower colon which were treated with endoscopic clipping.  The patient reports that she had rectal bleeding but has had none since he admitted.  The patient had a hemoglobin of 11 on admission that has gone down to 9. Patient had her anticoagulation stopped and she was put on a PPI.  She was also hypotensive that responded to IV fluids.  I'Horne now being asked to see the patient for her rectal bleeding.  Past Medical History:  Diagnosis Date  . Chemotherapy-induced peripheral neuropathy ([place])   . Epistaxis    a. 11/2016 in setting of asa/plavix-->silver nitrate cauterization.  . GI bleed    a. 11/2016 Admission w/ GIB and hypovolemic shock req 3u PRBC's;  b. 11/2016 ECG: gastritis & nonbleeding peptic ulcer; c. 11/2016 Conlonoscopy: rectal and sigmoid colonic ulcers.  . Heart attack ([place])    a. 1998 Cath @ [facility]: reportedly no intervention required.  Marland Kitchen Herceptin-induced cardiomyopathy ([place])    a. In the setting of Herceptin Rx for breast cancer (initiated 12/2014); b. 03/2015 MUGA EF 64%; b. 08/2015 MUGA: EF 51%; c. 10/2015 MUGA: EF 44%; d. 11/2015 Echo: EF 45-50%; e. 01/2016 MUGA: EF 60%; f. 06/2016 MUGA EF 65%; g. 10/2016 MUGA: EF 61%;  h. 12/2016 Echo: EF 55-60%, gr1 DD.  Marland Kitchen Hyperlipidemia   . Hypertension   . Neuropathy   . Possible PAD (peripheral artery disease) ([place])    a. 11/2016 LE cyanosis and  weak pulses-->CTA w/o significant Ao-BiFem dzs. ? distal dzs-->ASA/Plavix initiated by vascular surgery.  Marland Kitchen PSVT (paroxysmal supraventricular tachycardia) ([place])    a. Dx 11/2016.  . Pulmonary embolism ([place])    a. 12/2016 CTA Chest: small nonocclusive PE in inferior segment of the Left lingula, somewhat eccentric filling defect suggesting chronic rather than acute embolic event; b. 09/7406 LE U/S:  No DVT; c. 12/2016 Echo: Nl RV fxn, nl PASP.  Marland Kitchen Recurrent Metastatic breast cancer ([place])    a. Dx 2016: Stage II, ER positive, PR positive, HER-2/neu overexpressing of the left breast-->chemo/radiation; b. 10/2016 CT Abd/pelvis: diffuse liver mets, ill defined sclerotic bone lesions-T12;  c. 10/2016 MRI brain: metastatic lesion along L temporal lobe (19x8m) w/ extensive surrounding edema & 521mmidline shift to right.  . Sinus tachycardia     Past Surgical History:  Procedure Laterality Date  . BREAST BIOPSY Left 2016   Positive  . BREAST LUMPECTOMY WITH SENTINEL LYMPH NODE BIOPSY Left 05/23/2015   Procedure: LEFT BREAST WIDE EXCISION WITH AXILLARY DISSECTION, MASTOPLASTY ;  Surgeon: Sarah BellowMD;  Location: [facility] ORS;  Service: General;  Laterality: Left;  . BREAST SURGERY Left 12/18/14   breast biopsy/INVASIVE DUCTAL CARCINOMA OF BREAST, NOTTINGHAM GRADE 2.  . Marland KitchenREAST SURGERY  05/23/2015.   Wide excision/mastoplasty,  axillary dissection. No residual invasive cancer, positive for residual DCIS. 0/2 nodes identified on axillary dissection. (no SLN by technetium or methylene blue)  . CARDIAC CATHETERIZATION    . COLONOSCOPY WITH PROPOFOL N/A 12/10/2016   Procedure: COLONOSCOPY WITH PROPOFOL;  Surgeon: Sarah Lame, MD;  Location: [facility] ENDOSCOPY;  Service: Endoscopy;  Laterality: N/A;  . ESOPHAGOGASTRODUODENOSCOPY (EGD) WITH PROPOFOL N/A 12/08/2016   Procedure: ESOPHAGOGASTRODUODENOSCOPY (EGD) WITH PROPOFOL;  Surgeon: Sarah Lame, MD;  Location: [facility] ENDOSCOPY;  Service: Endoscopy;  Laterality: N/A;  .  PORTACATH PLACEMENT Right 12-31-14   Dr Sarah Horne    Prior to Admission medications   Medication Sig Start Date End Date Taking? Authorizing Provider  anastrozole (ARIMIDEX) 1 MG tablet TAKE 1 TABLET DAILY 11/04/16  Yes Sarah Sickle, MD  aspirin 81 MG tablet Take 81 mg by mouth daily.   Yes [provider]  diphenoxylate-atropine (LOMOTIL) 2.5-0.025 MG tablet Take 1 tablet by mouth 4 (four) times daily as needed for diarrhea or loose stools. Take it along with immodium 09/07/16  Yes Sarah, Sarah Headland, MD  DULoxetine (CYMBALTA) 60 MG capsule TAKE 1 CAPSULE (60 MG TOTAL) BY MOUTH DAILY. 12/23/16  Yes Sarah Sickle, MD  flecainide (TAMBOCOR) 50 MG tablet Take 1 tablet (50 mg total) by mouth 2 (two) times daily. 01/16/17  Yes Horne, Sarah Heman, MD  KLOR-CON M20 20 MEQ tablet Take 20 mEq by mouth 2 (two) times daily. 11/26/16  Yes [provider]  metoprolol tartrate (LOPRESSOR) 25 MG tablet Take 0.5 tablets (12.5 mg total) by mouth 2 (two) times daily. Hold if you're Systolic Blood pressure is less than 100. 01/07/17  Yes Horne, Sarah M, PA-C  ondansetron (ZOFRAN) 8 MG tablet Take 1 tablet (8 mg total) by mouth every 8 (eight) hours as needed for nausea or vomiting (start 3 days; after chemo). 11/26/16  Yes Sarah Sickle, MD  pantoprazole (PROTONIX) 40 MG tablet Take 1 tablet (40 mg total) by mouth 2 (two) times daily. 12/11/16  Yes Sarah Mandes, MD  polyethylene glycol (MIRALAX / GLYCOLAX) packet Take 17 g by mouth daily. 12/07/16  Yes Sarah Mandes, MD  rivaroxaban (XARELTO) 10 MG TABS tablet Take 1 tablet (10 mg total) by mouth daily. 01/16/17  Yes Horne, Sarah Heman, MD  feeding supplement, ENSURE ENLIVE, (ENSURE ENLIVE) LIQD Take 237 mLs by mouth 2 (two) times daily between meals. 12/07/16   Sarah Mandes, MD    Family History  Problem Relation Age of Onset  . Breast cancer Maternal Aunt   . Breast cancer Cousin   . Brain cancer Maternal Uncle      Social History    Substance Use Topics  . Smoking status: Light Tobacco Smoker    Packs/day: 0.50    Years: 18.00    Types: Cigarettes  . Smokeless tobacco: Never Used  . Alcohol use No    Allergies as of 02/11/2017 - Review Complete 02/11/2017  Allergen Reaction Noted  . No known allergies  01/17/2015    Review of Systems:    All systems reviewed and negative except where noted in HPI.   Physical Exam:  Vital signs in last 24 hours: Temp:  [97.5 F (36.4 C)-98.6 F (37 C)] 98 F (36.7 C) (05/18 1500) Pulse Rate:  [95-135] 95 (05/18 1500) Resp:  [16-23] 16 (05/18 0849) BP: (89-136)/(51-87) 100/51 (05/18 1500) SpO2:  [95 %-100 %] 100 % (05/18 1500) Weight:  [143 lb 3.2 oz (65 kg)-151 lb (68.5 kg)] 143 lb 3.2 oz (  65 kg) (05/17 2229) Last BM Date: 02/11/17 General:   Pleasant, cooperative in NAD Head:  Normocephalic and atraumatic. Eyes:   No icterus.   Conjunctiva pink. PERRLA. Ears:  Normal auditory acuity. Neck:  Supple; no masses or thyroidomegaly Lungs: Respirations even and unlabored. Lungs clear to auscultation bilaterally.   No wheezes, crackles, or rhonchi.  Heart:  Regular rate and rhythm;  Without murmur, clicks, rubs or gallops Abdomen:  Soft, nondistended, nontender. Normal bowel sounds. No appreciable masses or hepatomegaly.  No rebound or guarding.  Rectal:  Not performed. Msk:  Symmetrical without gross deformities.   Extremities:  Without edema, cyanosis or clubbing. Neurologic:  Alert and oriented x3;  grossly normal neurologically. Skin:  Intact without significant lesions or rashes. Cervical Nodes:  No significant cervical adenopathy. Psych:  Alert and cooperative. Normal affect.  LAB RESULTS:  Recent Labs  02/11/17 1855 02/12/17 0219  WBC 8.6 11.7*  HGB 11.1* 9.1*  HCT 33.0* 27.8*  PLT 108* 81*   BMET  Recent Labs  02/11/17 2018 02/12/17 0219  NA 132* 135  K 3.6 3.1*  CL 96* 100*  CO2 22 25  GLUCOSE 91 79  BUN 5* 6  CREATININE 0.76 0.75  CALCIUM  8.4* 7.9*   LFT  Recent Labs  02/11/17 2018  PROT 6.4*  ALBUMIN 2.6*  AST 142*  ALT 44  ALKPHOS 319*  BILITOT 2.7*   PT/INR  Recent Labs  02/11/17 1855  LABPROT 32.4*  INR 3.07    STUDIES: Dg Lumbar Spine 2-3 Views  Result Date: 02/12/2017 CLINICAL DATA:  Right lower extremity weakness for several days EXAM: LUMBAR SPINE - 2-3 VIEW COMPARISON:  None. FINDINGS: Frontal, lateral, and spot lumbosacral lateral images were obtained. There are 5 non-rib-bearing lumbar type vertebral bodies. There is no fracture or spondylolisthesis. There is mild disc space narrowing at L4-5. The disc spaces appear unremarkable. No erosive change. There is aortic atherosclerosis. Calcification in the pelvis is felt to represent calcified uterine leiomyomas. IMPRESSION: No fracture or spondylolisthesis. Disc space narrowing L4-5. Other disc spaces appear unremarkable. Calcified uterine leiomyomas.  Aortic atherosclerosis. Electronically Signed   By: Lowella Grip III Horne.D.   On: 02/12/2017 12:07   Mr Brain Wo Contrast  Result Date: 02/12/2017 CLINICAL DATA:  Right lower extremity weakness. History of metastatic breast cancer. Follow-up. EXAM: MRI HEAD WITHOUT CONTRAST TECHNIQUE: Multiplanar, multiecho pulse sequences of the brain and surrounding structures were obtained without intravenous contrast. COMPARISON:  11/10/2016 FINDINGS: Brain: Today's study was ordered and performed without contrast. Previously, the patient had an 18 x 19 mm left temporal metastasis with pronounced vasogenic edema. On today's study, the edema is completely resolved. There is a focus of T2 and FLAIR signal remaining in the left temporal lobe measuring 8 x 10 mm in diameter which could represent post treatment gliosis. Cannot completely rule out some residual viable tumor in this location, but this may simply be post treatment gliosis. No new or second lesion is identified on this noncontrast study. The brain is otherwise normal.  No hydrocephalus or extra-axial collection. No midline shift. Vascular: Major vessels at the base of the brain show flow. Skull and upper cervical spine: Negative Sinuses/Orbits: Clear/normal Other: None IMPRESSION: Positive response to treatment when compared to the study of February. Today's study is done without contrast. There is a residual 8 x 10 mm focus of T2 and FLAIR signal in the left temporal lobe that may simply be post treatment gliosis. There is no residual  edema. Cannot completely rule out residual viable tumor, and further follow-up is warranted. No new lesion is seen. Electronically Signed   By: Nelson Chimes Horne.D.   On: 02/12/2017 10:38      Impression / Plan:   Sarah Horne is a 61 y.o. y/o female with a history of colon ulcers that were treated with endoclips. The patient now was admitted with recurrent rectal bleeding and a drop in her hemoglobin from 11 to 9.  The patient will be set up for a colonoscopy for tomorrow and will be given a prep today. I have discussed risks & benefits which include, but are not limited to, bleeding, infection, perforation & drug reaction.  The patient agrees with this plan & written consent will be obtained.    Thank you for involving me in the care of this patient.      LOS: 1 day   Sarah Lame, MD  02/12/2017, 5:57 PM   Note: This dictation was prepared with Dragon dictation along with smaller phrase technology. Any transcriptional errors that result from this process are unintentional.

## 2017-02-12 NOTE — Care Management (Addendum)
patient admitted from home with lower gib. Her hgb is above 9.  Was placed on low dose Xarelto one moth ago for acute pulmonary embolus.  She is being treated for metastatic breast cancer. Remains open to [facility] and was to be seen one more time and if stable was to be discharge. Did received physical therapy through [facility]  Notified agency of admission. Vascular, oncology, and gi consults ordered.  Neuro has seen due to complaint of right sided weakness and foot drop.  Patient does have a known brain lesion.  PT consult is pending

## 2017-02-12 NOTE — Progress Notes (Addendum)
[place] [facility] [place] NAME: Sarah Horne    MR#:  [id number]  DATE OF BIRTH:  05/15/1956  SUBJECTIVE:   Patient here with leg weakness and melena No dark stools overnight She just came back from MRI  REVIEW OF SYSTEMS:    Review of Systems  Constitutional: Negative for fever, chills weight loss HENT: Negative for ear pain, nosebleeds, congestion, facial swelling, rhinorrhea, neck pain, neck stiffness and ear discharge.   Respiratory: Negative for cough, shortness of breath, wheezing  Cardiovascular: Negative for chest pain, palpitations and leg swelling.  Gastrointestinal: Negative for heartburn, abdominal pain, vomiting, diarrhea or consitpation No hematochezia or melena Genitourinary: Negative for dysuria, urgency, frequency, hematuria Musculoskeletal: Negative for back pain or joint pain Neurological: Negative for dizziness, seizures, syncope,   numbness and headaches.   Right leg weakness Hematological: Does not bruise/bleed easily.  Psychiatric/Behavioral: Negative for hallucinations, confusion, dysphoric mood    Tolerating Diet: yes      DRUG ALLERGIES:   Allergies  Allergen Reactions  . No Known Allergies     VITALS:  Blood pressure (!) 97/51, pulse 96, temperature 98.6 F (37 C), temperature source Oral, resp. rate 16, height '5\' 6"'$  (1.676 m), weight 65 kg (143 lb 3.2 oz), SpO2 99 %.  PHYSICAL EXAMINATION:  Constitutional: Appears well-developed and well-nourished. No distress. HENT: Normocephalic. Marland Kitchen Oropharynx is clear and moist.  Eyes: Conjunctivae and EOM are normal. PERRLA, no scleral icterus.  Neck: Normal ROM. Neck supple. No JVD. No tracheal deviation. CVS: RRR, S1/S2 +, no murmurs, no gallops, no carotid bruit.  Pulmonary: Effort and breath sounds normal, no stridor, rhonchi, wheezes, rales.  Abdominal: Soft. BS +,  no distension, tenderness, rebound or guarding.  Musculoskeletal: Normal range of motion.  No edema and no tenderness.  Neuro: Alert. CN 2-12 grossly intact. No focal deficits.Strength is 5 out of 5 bilaterally and symmetrically Skin: Skin is warm and dry. No rash noted. Psychiatric: Normal mood and affect.      LABORATORY PANEL:   CBC  Recent Labs Lab 02/12/17 0219  WBC 11.7*  HGB 9.1*  HCT 27.8*  PLT 81*   ------------------------------------------------------------------------------------------------------------------  Chemistries   Recent Labs Lab 02/11/17 2018 02/12/17 0219  NA 132* 135  K 3.6 3.1*  CL 96* 100*  CO2 22 25  GLUCOSE 91 79  BUN 5* 6  CREATININE 0.76 0.75  CALCIUM 8.4* 7.9*  AST 142*  --   ALT 44  --   ALKPHOS 319*  --   BILITOT 2.7*  --    ------------------------------------------------------------------------------------------------------------------  Cardiac Enzymes  Recent Labs Lab 02/11/17 2018 02/12/17 0219 02/12/17 0743  TROPONINI 0.06* 0.03* 0.03*   ------------------------------------------------------------------------------------------------------------------  RADIOLOGY:  Mr Brain Wo Contrast  Result Date: 02/12/2017 CLINICAL DATA:  Right lower extremity weakness. History of metastatic breast cancer. Follow-up. EXAM: MRI HEAD WITHOUT CONTRAST TECHNIQUE: Multiplanar, multiecho pulse sequences of the brain and surrounding structures were obtained without intravenous contrast. COMPARISON:  11/10/2016 FINDINGS: Brain: Today's study was ordered and performed without contrast. Previously, the patient had an 18 x 19 mm left temporal metastasis with pronounced vasogenic edema. On today's study, the edema is completely resolved. There is a focus of T2 and FLAIR signal remaining in the left temporal lobe measuring 8 x 10 mm in diameter which could represent post treatment gliosis. Cannot completely rule out some residual viable tumor in this location, but this may simply be post treatment gliosis. No new or second lesion is  identified on this noncontrast study. The brain is otherwise normal. No hydrocephalus or extra-axial collection. No midline shift. Vascular: Major vessels at the base of the brain show flow. Skull and upper cervical spine: Negative Sinuses/Orbits: Clear/normal Other: None IMPRESSION: Positive response to treatment when compared to the study of February. Today's study is done without contrast. There is a residual 8 x 10 mm focus of T2 and FLAIR signal in the left temporal lobe that may simply be post treatment gliosis. There is no residual edema. Cannot completely rule out residual viable tumor, and further follow-up is warranted. No new lesion is seen. Electronically Signed   By: Nelson Chimes M.D.   On: 02/12/2017 10:38     ASSESSMENT AND PLAN:   61 year old female with history of metastatic breast cancer undergoing chemotherapy, recent pulmonary emboli on anticoagulation, past history of GI bleed who presents with melena and right foot weakness.  1. GI bleed: Continue PPI Follow CBC GI consult pending EGD in March showed gastritis and nonbleeding peptic ulcer and colonoscopy showed some rectal and sigmoid colonic ulcers  2. Right foot drop/weakness: Follow up on MRI PT consult  3. Recent pulmonary emboli on anticoagulation which is being held due to problem #1  Patient may need IVC filter placed Consult vascular surgery  4. Recurrent metastatic breast cancer: Oncology evaluation  5. Hypokalemia: Replete and recheck in a.m.  6. Elevated troponin due to demand ischemia. Patient has ruled out for non-ST elevation MI  7. Acute on chronic anemia from GI bleed Continue to monitor CBC    Management plans discussed with the patient and she is in agreement.  CODE STATUS: fu;;  TOTAL TIME TAKING CARE OF THIS PATIENT: 30 minutes.     POSSIBLE D/C 2 days, DEPENDING ON CLINICAL CONDITION.   Elisandro Jarrett M.D on 02/12/2017 at 10:44 AM  Between 7am to 6pm - Pager - [phone] After  6pm go to [email or web address] - password [email or web address] [facility]  Office  [phone]  CC: Primary care physician; [name], MD  Note: This dictation was prepared with Dragon dictation along with smaller phrase technology. Any transcriptional errors that result from this process are unintentional.

## 2017-02-12 NOTE — Consult Note (Signed)
[facility] CONSULT NOTE  Patient Care Team: [name], MD as PCP - General (Family Medicine) Bary Castilla, Forest Gleason, MD (General Surgery) [name], MD (Inactive) as Attending Physician (Internal Medicine)  CHIEF COMPLAINTS/PURPOSE OF CONSULTATION: metastatic breast cancer  HISTORY OF PRESENTING ILLNESS:  Sarah Horne 61 y.o.  female history of  Recurrent/metastatic breast cancer ER/PR positive HER-2/neu negative currently on Eribulin; With metastasis to brain status post radiation; history of incidental PE-on 10 mg of Xarelto- is currently admitted to the hospital for  GI bleed.   Patient was admitted to the hospital in March 2018- lower GI bleed with hypotension status post colonoscopy- sigmoid ulcers status post cautery. Patient was also on aspirin and Plavix at the time- 4 lower extremity vascular insufficiency. Patient most recently had been on Xarelto 10 mg with a history of PE; given the risk of GI bleed.  Patient stated that she noted to have low blood pressures on the day prior to  Admission and also noted to have black colored stools/blood in stools. Patient felt weak overall. She was then prompted to come to the room. On admission the hospital hemoglobin was 11-today 9.1. Platelets-108 yesterday; and today 81. Patient received Erbulin cycle #2 day 8; with Neulasta support on May 10th.   Patient also noted weakness in the right foot flexion. She denies any headaches. Denies any nausea vomiting.denies any fevers or chills.  ROS: A complete 10 point review of system is done which is negative except mentioned above in history of present illness  MEDICAL HISTORY:  Past Medical History:  Diagnosis Date  . Chemotherapy-induced peripheral neuropathy ([place])   . Epistaxis    a. 11/2016 in setting of asa/plavix-->silver nitrate cauterization.  . GI bleed    a. 11/2016 Admission w/ GIB and hypovolemic shock req 3u PRBC's;  b. 11/2016 ECG: gastritis & nonbleeding  peptic ulcer; c. 11/2016 Conlonoscopy: rectal and sigmoid colonic ulcers.  . Heart attack ([place])    a. 1998 Cath @ [facility]: reportedly no intervention required.  Marland Kitchen Herceptin-induced cardiomyopathy ([place])    a. In the setting of Herceptin Rx for breast cancer (initiated 12/2014); b. 03/2015 MUGA EF 64%; b. 08/2015 MUGA: EF 51%; c. 10/2015 MUGA: EF 44%; d. 11/2015 Echo: EF 45-50%; e. 01/2016 MUGA: EF 60%; f. 06/2016 MUGA EF 65%; g. 10/2016 MUGA: EF 61%;  h. 12/2016 Echo: EF 55-60%, gr1 DD.  Marland Kitchen Hyperlipidemia   . Hypertension   . Neuropathy   . Possible PAD (peripheral artery disease) ([place])    a. 11/2016 LE cyanosis and weak pulses-->CTA w/o significant Ao-BiFem dzs. ? distal dzs-->ASA/Plavix initiated by vascular surgery.  Marland Kitchen PSVT (paroxysmal supraventricular tachycardia) ([place])    a. Dx 11/2016.  . Pulmonary embolism ([place])    a. 12/2016 CTA Chest: small nonocclusive PE in inferior segment of the Left lingula, somewhat eccentric filling defect suggesting chronic rather than acute embolic event; b. 05/6221 LE U/S:  No DVT; c. 12/2016 Echo: Nl RV fxn, nl PASP.  Marland Kitchen Recurrent Metastatic breast cancer ([place])    a. Dx 2016: Stage II, ER positive, PR positive, HER-2/neu overexpressing of the left breast-->chemo/radiation; b. 10/2016 CT Abd/pelvis: diffuse liver mets, ill defined sclerotic bone lesions-T12;  c. 10/2016 MRI brain: metastatic lesion along L temporal lobe (19x27m) w/ extensive surrounding edema & 572mmidline shift to right.  . Sinus tachycardia     SURGICAL HISTORY: Past Surgical History:  Procedure Laterality Date  . BREAST BIOPSY Left 2016   Positive  .  BREAST LUMPECTOMY WITH SENTINEL LYMPH NODE BIOPSY Left 05/23/2015   Procedure: LEFT BREAST WIDE EXCISION WITH AXILLARY DISSECTION, MASTOPLASTY ;  Surgeon: [name], MD;  Location: [facility] ORS;  Service: General;  Laterality: Left;  . BREAST SURGERY Left 12/18/14   breast biopsy/INVASIVE DUCTAL CARCINOMA OF BREAST, NOTTINGHAM GRADE 2.  Marland Kitchen BREAST SURGERY   05/23/2015.   Wide excision/mastoplasty, axillary dissection. No residual invasive cancer, positive for residual DCIS. 0/2 nodes identified on axillary dissection. (no SLN by technetium or methylene blue)  . CARDIAC CATHETERIZATION    . COLONOSCOPY WITH PROPOFOL N/A 12/10/2016   Procedure: COLONOSCOPY WITH PROPOFOL;  Surgeon: Lucilla Lame, MD;  Location: [facility] ENDOSCOPY;  Service: Endoscopy;  Laterality: N/A;  . ESOPHAGOGASTRODUODENOSCOPY (EGD) WITH PROPOFOL N/A 12/08/2016   Procedure: ESOPHAGOGASTRODUODENOSCOPY (EGD) WITH PROPOFOL;  Surgeon: Lucilla Lame, MD;  Location: [facility] ENDOSCOPY;  Service: Endoscopy;  Laterality: N/A;  . PORTACATH PLACEMENT Right 12-31-14   Dr Bary Castilla    SOCIAL HISTORY: Social History   Social History  . Marital status: Divorced    Spouse name: N/A  . Number of children: N/A  . Years of education: N/A   Occupational History  . Not on file.   Social History Main Topics  . Smoking status: Light Tobacco Smoker    Packs/day: 0.50    Years: 18.00    Types: Cigarettes  . Smokeless tobacco: Never Used  . Alcohol use No  . Drug use: No  . Sexual activity: Not on file   Other Topics Concern  . Not on file   Social History Narrative   Lives in [place] by herself.    FAMILY HISTORY: Family History  Problem Relation Age of Onset  . Breast cancer Maternal Aunt   . Breast cancer Cousin   . Brain cancer Maternal Uncle     ALLERGIES:  is allergic to no known allergies.  MEDICATIONS:  Current Facility-Administered Medications  Medication Dose Route Frequency Provider Last Rate Last Dose  . acetaminophen (TYLENOL) tablet 650 mg  650 mg Oral Q6H PRN Lance Coon, MD       Or  . acetaminophen (TYLENOL) suppository 650 mg  650 mg Rectal Q6H PRN Lance Coon, MD      . anastrozole (ARIMIDEX) tablet 1 mg  1 mg Oral Daily Lance Coon, MD   1 mg at 02/12/17 1139  . DULoxetine (CYMBALTA) DR capsule 60 mg  60 mg Oral Daily Lance Coon, MD   60 mg at 02/12/17  1139  . flecainide (TAMBOCOR) tablet 50 mg  50 mg Oral BID Lance Coon, MD   50 mg at 02/12/17 1139  . ondansetron (ZOFRAN) tablet 4 mg  4 mg Oral Q6H PRN Lance Coon, MD       Or  . ondansetron [facility]) injection 4 mg  4 mg Intravenous Q6H PRN Lance Coon, MD      . pantoprazole (PROTONIX) EC tablet 40 mg  40 mg Oral BID Lance Coon, MD   40 mg at 02/12/17 1139  . [START ON 02/13/2017] potassium chloride SA (K-DUR,KLOR-CON) CR tablet 20 mEq  20 mEq Oral BID Bettey Costa, MD       Facility-Administered Medications Ordered in Other Encounters  Medication Dose Route Frequency Provider Last Rate Last Dose  . 0.9 %  sodium chloride infusion   Intravenous Continuous Pandit, Sandeep, MD      . 0.9 %  sodium chloride infusion   Intravenous Continuous Lloyd Huger, MD 999 mL/hr at 04/24/15 1520    .  heparin lock flush 100 unit/mL  500 Units Intravenous Once Berenzon, Dmitriy, MD      . sodium chloride 0.9 % injection 10 mL  10 mL Intravenous PRN [name], MD   10 mL at 04/04/15 1440  . sodium chloride flush (NS) 0.9 % injection 10 mL  10 mL Intravenous PRN Berenzon, Dmitriy, MD      . Tbo-Filgrastim [facility]) injection 480 mcg  480 mcg Subcutaneous Once Charlaine Dalton R, MD          .  PHYSICAL EXAMINATION:  Vitals:   02/12/17 0849 02/12/17 1500  BP: (!) 97/51 (!) 100/51  Pulse: 96 95  Resp: 16   Temp: 98.6 F (37 C) 98 F (36.7 C)   Filed Weights   02/11/17 1854 02/11/17 2229  Weight: 151 lb (68.5 kg) 143 lb 3.2 oz (65 kg)    GENERAL: Well-nourished well-developed; Alert, no distress and comfortable.   Alone. EYES:positive for pallor no icterus OROPHARYNX: no thrush or ulceration. NECK: supple, no masses felt LYMPH:  no palpable lymphadenopathy in the cervical, axillary or inguinal regions LUNGS: decreased breath sounds to auscultation at bases and  No wheeze or crackles HEART/CVS: regular rate & rhythm and no murmurs; No lower extremity edema ABDOMEN:  abdomen soft, non-tender and normal bowel sounds Musculoskeletal:no cyanosis of digits and no clubbing  PSYCH: alert & oriented x 3 with fluent speech NEURO: weakness noted in the right lower extremity/foot drop SKIN:  no rashes or significant lesions  LABORATORY DATA:  I have reviewed the data as listed Lab Results  Component Value Date   WBC 11.7 (H) 02/12/2017   HGB 9.1 (L) 02/12/2017   HCT 27.8 (L) 02/12/2017   MCV 89.1 02/12/2017   PLT 81 (L) 02/12/2017    Recent Labs  10/19/16 1525 10/26/16 0825  01/12/17 1514  01/28/17 0911 02/04/17 1325 02/11/17 2018 02/12/17 0219  NA  --   --   < > 135  < > 134* 133* 132* 135  K  --   --   < > 2.9*  < > 2.8* 3.3* 3.6 3.1*  CL  --   --   < > 100*  < > 99* 99* 96* 100*  CO2  --   --   < > 27  < > 30 26 22 25   GLUCOSE  --   --   < > 151*  < > 82 100* 91 79  BUN  --   --   < > 9  < > 8 <5* 5* 6  CREATININE  --   --   < > 0.51  < > 0.40* 0.52 0.76 0.75  CALCIUM  --   --   < > 8.1*  < > 8.2* 8.2* 8.4* 7.9*  GFRNONAA  --   --   < > >60  < > >60 >60 >60 >60  GFRAA  --   --   < > >60  < > >60 >60 >60 >60  PROT 7.8 7.8  < > 6.1*  --  5.8*  --  6.4*  --   ALBUMIN 4.0 3.9  < > 2.3*  --  2.3*  --  2.6*  --   AST 130* 145*  < > 85*  --  46*  --  142*  --   ALT 138* 137*  < > 27  --  22  --  44  --   ALKPHOS 291* 304*  < > 217*  --  195*  --  319*  --   BILITOT 0.8 1.1  < > 1.5*  --  1.4*  --  2.7*  --   BILIDIR 0.2 <0.1*  --  0.4  --   --   --   --   --   IBILI 0.6 NOT CALCULATED  --  1.1*  --   --   --   --   --   < > = values in this interval not displayed.  RADIOGRAPHIC STUDIES: I have personally reviewed the radiological images as listed and agreed with the findings in the report. Dg Lumbar Spine 2-3 Views  Result Date: 02/12/2017 CLINICAL DATA:  Right lower extremity weakness for several days EXAM: LUMBAR SPINE - 2-3 VIEW COMPARISON:  None. FINDINGS: Frontal, lateral, and spot lumbosacral lateral images were obtained. There are 5  non-rib-bearing lumbar type vertebral bodies. There is no fracture or spondylolisthesis. There is mild disc space narrowing at L4-5. The disc spaces appear unremarkable. No erosive change. There is aortic atherosclerosis. Calcification in the pelvis is felt to represent calcified uterine leiomyomas. IMPRESSION: No fracture or spondylolisthesis. Disc space narrowing L4-5. Other disc spaces appear unremarkable. Calcified uterine leiomyomas.  Aortic atherosclerosis. Electronically Signed   By: Lowella Grip III M.D.   On: 02/12/2017 12:07   Mr Brain Wo Contrast  Result Date: 02/12/2017 CLINICAL DATA:  Right lower extremity weakness. History of metastatic breast cancer. Follow-up. EXAM: MRI HEAD WITHOUT CONTRAST TECHNIQUE: Multiplanar, multiecho pulse sequences of the brain and surrounding structures were obtained without intravenous contrast. COMPARISON:  11/10/2016 FINDINGS: Brain: Today's study was ordered and performed without contrast. Previously, the patient had an 18 x 19 mm left temporal metastasis with pronounced vasogenic edema. On today's study, the edema is completely resolved. There is a focus of T2 and FLAIR signal remaining in the left temporal lobe measuring 8 x 10 mm in diameter which could represent post treatment gliosis. Cannot completely rule out some residual viable tumor in this location, but this may simply be post treatment gliosis. No new or second lesion is identified on this noncontrast study. The brain is otherwise normal. No hydrocephalus or extra-axial collection. No midline shift. Vascular: Major vessels at the base of the brain show flow. Skull and upper cervical spine: Negative Sinuses/Orbits: Clear/normal Other: None IMPRESSION: Positive response to treatment when compared to the study of February. Today's study is done without contrast. There is a residual 8 x 10 mm focus of T2 and FLAIR signal in the left temporal lobe that may simply be post treatment gliosis. There is no  residual edema. Cannot completely rule out residual viable tumor, and further follow-up is warranted. No new lesion is seen. Electronically Signed   By: Nelson Chimes M.D.   On: 02/12/2017 10:38    ASSESSMENT & PLAN:  # 61 year old  Female patient with metastatic breast cancer currently admitted to the hospital for GI bleed  # Metastatic breast cancer ER/PR positive HER-2/neu negative- currently on Eribulin cycle #2 day 8 [with neulasta] on 5/10th. Patient had a good response of her breast cancer to chemotherapy so far; however multiple admission the hospital. Concerns for foot drop/neuropathy from- Eribulin.Patient having poor tolerance to chemotherapy in general. Recommend treatment with Faslodex + Abemaciclib.   # right foot drop/lower extremity weakness- Given the good response in the brain from the radiation; do not suspect progressive CNS disease cause of patient's weakness. Possible toxicity from Eribulin. Appreciate neurology recommendations. Plan as above.  # acute GI  bleed- on Xarelto 10 mg;agree with holding Xarelto at this time. Await GI evaluation/recommendations.  # history of incidental PE- currently off anticoagulation. Await vascular surgery recommendations regarding IVC filter. Patient in general at high risk for DVT PE given her active malignancy.  Thank you Dr.Modi for allowing me to participate in the care of your pleasant patient. Please do not hesitate to contact me with questions or concerns in the interim. Dr.Rao available over the weekend.   All questions were answered. The patient knows to call the clinic with any problems, questions or concerns.    Cammie Sickle, MD 02/12/2017 6:45 PM

## 2017-02-12 NOTE — H&P (Signed)
Sarah Horne at [place] NAME: Sarah Horne    MR#:  [id number]  DATE OF BIRTH:  02/25/1956  DATE OF ADMISSION:  02/11/2017  PRIMARY CARE PHYSICIAN: Sarah Nova, MD   REQUESTING/REFERRING PHYSICIAN: Burlene Arnt, MD  CHIEF COMPLAINT:   Chief Complaint  Patient presents with  . Hypotension  . Blood In Stools    HISTORY OF PRESENT ILLNESS:  Sarah Horne  is a 61 y.o. female who presents with An episode of melena. Patient has metastatic breast cancer, has had pulmonary emboli, and is on anticoagulation. She has had GI bleed in the past, but given her PE she needed anticoagulation. She states that she had an episode of melena today. Her hemoglobin here in the ED is stable, but her blood pressure was low and she is tachycardic. She also states that she has been having some right lower extremity weakness since she was last discharged from the hospital. She does have known metastatic brain cancer, but states that she is receiving radiation and chemotherapy for that treatment. These symptoms are new since her last hospital stay. Hospitalists were called for admission  PAST MEDICAL HISTORY:   Past Medical History:  Diagnosis Date  . Chemotherapy-induced peripheral neuropathy ([place])   . Epistaxis    a. 11/2016 in setting of asa/plavix-->silver nitrate cauterization.  . GI bleed    a. 11/2016 Admission w/ GIB and hypovolemic shock req 3u PRBC's;  b. 11/2016 ECG: gastritis & nonbleeding peptic ulcer; c. 11/2016 Conlonoscopy: rectal and sigmoid colonic ulcers.  . Heart attack ([place])    a. 1998 Cath @ [facility]: reportedly no intervention required.  Marland Kitchen Herceptin-induced cardiomyopathy ([place])    a. In the setting of Herceptin Rx for breast cancer (initiated 12/2014); b. 03/2015 MUGA EF 64%; b. 08/2015 MUGA: EF 51%; c. 10/2015 MUGA: EF 44%; d. 11/2015 Echo: EF 45-50%; e. 01/2016 MUGA: EF 60%; f. 06/2016 MUGA EF 65%; g. 10/2016 MUGA: EF 61%;  h. 12/2016 Echo: EF 55-60%,  gr1 DD.  Marland Kitchen Hyperlipidemia   . Hypertension   . Neuropathy   . Possible PAD (peripheral artery disease) ([place])    a. 11/2016 LE cyanosis and weak pulses-->CTA w/o significant Ao-BiFem dzs. ? distal dzs-->ASA/Plavix initiated by vascular surgery.  Marland Kitchen PSVT (paroxysmal supraventricular tachycardia) ([place])    a. Dx 11/2016.  . Pulmonary embolism ([place])    a. 12/2016 CTA Chest: small nonocclusive PE in inferior segment of the Left lingula, somewhat eccentric filling defect suggesting chronic rather than acute embolic event; b. 0/1779 LE U/S:  No DVT; c. 12/2016 Echo: Nl RV fxn, nl PASP.  Marland Kitchen Recurrent Metastatic breast cancer ([place])    a. Dx 2016: Stage II, ER positive, PR positive, HER-2/neu overexpressing of the left breast-->chemo/radiation; b. 10/2016 CT Abd/pelvis: diffuse liver mets, ill defined sclerotic bone lesions-T12;  c. 10/2016 MRI brain: metastatic lesion along L temporal lobe (19x26m) w/ extensive surrounding edema & 551mmidline shift to right.  . Sinus tachycardia     PAST SURGICAL HISTORY:   Past Surgical History:  Procedure Laterality Date  . BREAST BIOPSY Left 2016   Positive  . BREAST LUMPECTOMY WITH SENTINEL LYMPH NODE BIOPSY Left 05/23/2015   Procedure: LEFT BREAST WIDE EXCISION WITH AXILLARY DISSECTION, MASTOPLASTY ;  Surgeon: JeRobert BellowMD;  Location: [facility] ORS;  Service: General;  Laterality: Left;  . BREAST SURGERY Left 12/18/14   breast biopsy/INVASIVE DUCTAL CARCINOMA OF BREAST, NOTTINGHAM GRADE 2.  . Marland KitchenREAST SURGERY  05/23/2015.   Wide excision/mastoplasty, axillary dissection. No residual invasive cancer, positive for residual DCIS. 0/2 nodes identified on axillary dissection. (no SLN by technetium or methylene blue)  . CARDIAC CATHETERIZATION    . COLONOSCOPY WITH PROPOFOL N/A 12/10/2016   Procedure: COLONOSCOPY WITH PROPOFOL;  Surgeon: Lucilla Lame, MD;  Location: [facility] ENDOSCOPY;  Service: Endoscopy;  Laterality: N/A;  . ESOPHAGOGASTRODUODENOSCOPY (EGD) WITH PROPOFOL N/A  12/08/2016   Procedure: ESOPHAGOGASTRODUODENOSCOPY (EGD) WITH PROPOFOL;  Surgeon: Lucilla Lame, MD;  Location: [facility] ENDOSCOPY;  Service: Endoscopy;  Laterality: N/A;  . PORTACATH PLACEMENT Right 12-31-14   Dr Bary Castilla    SOCIAL HISTORY:   Social History  Substance Use Topics  . Smoking status: Light Tobacco Smoker    Packs/day: 0.50    Years: 18.00    Types: Cigarettes  . Smokeless tobacco: Never Used  . Alcohol use No    FAMILY HISTORY:   Family History  Problem Relation Age of Onset  . Breast cancer Maternal Aunt   . Breast cancer Cousin   . Brain cancer Maternal Uncle     DRUG ALLERGIES:   Allergies  Allergen Reactions  . No Known Allergies     MEDICATIONS AT HOME:   Prior to Admission medications   Medication Sig Start Date End Date Taking? Authorizing Provider  anastrozole (ARIMIDEX) 1 MG tablet TAKE 1 TABLET DAILY 11/04/16   Cammie Sickle, MD  aspirin 81 MG tablet Take 81 mg by mouth daily.    [provider]  diphenoxylate-atropine (LOMOTIL) 2.5-0.025 MG tablet Take 1 tablet by mouth 4 (four) times daily as needed for diarrhea or loose stools. Take it along with immodium 09/07/16   Cammie Sickle, MD  DULoxetine (CYMBALTA) 60 MG capsule TAKE 1 CAPSULE (60 MG TOTAL) BY MOUTH DAILY. 12/23/16   Cammie Sickle, MD  feeding supplement, ENSURE ENLIVE, (ENSURE ENLIVE) LIQD Take 237 mLs by mouth 2 (two) times daily between meals. 12/07/16   Fritzi Mandes, MD  flecainide (TAMBOCOR) 50 MG tablet Take 1 tablet (50 mg total) by mouth 2 (two) times daily. 01/16/17   Sainani, Belia Heman, MD  KLOR-CON M20 20 MEQ tablet Take 20 mEq by mouth 2 (two) times daily. 11/26/16   [provider]  metoprolol tartrate (LOPRESSOR) 25 MG tablet Take 0.5 tablets (12.5 mg total) by mouth 2 (two) times daily. Hold if you're Systolic Blood pressure is less than 100. 01/07/17   Dunn, Areta Haber, PA-C  ondansetron (ZOFRAN) 8 MG tablet Take 1 tablet (8 mg total) by mouth every 8  (eight) hours as needed for nausea or vomiting (start 3 days; after chemo). 11/26/16   Cammie Sickle, MD  pantoprazole (PROTONIX) 40 MG tablet Take 1 tablet (40 mg total) by mouth 2 (two) times daily. 12/11/16   Fritzi Mandes, MD  polyethylene glycol [facility] Floria Raveling) packet Take 17 g by mouth daily. 12/07/16   Fritzi Mandes, MD  potassium chloride SA (KLOR-CON M20) 20 MEQ tablet Take one tab twice a day 01/28/17   Cammie Sickle, MD  rivaroxaban (XARELTO) 10 MG TABS tablet Take 1 tablet (10 mg total) by mouth daily. 01/16/17   Henreitta Leber, MD    REVIEW OF SYSTEMS:  Review of Systems  Constitutional: Negative for chills, fever, malaise/fatigue and weight loss.  HENT: Negative for ear pain, hearing loss and tinnitus.   Eyes: Negative for blurred vision, double vision, pain and redness.  Respiratory: Negative for cough, hemoptysis and shortness of breath.  Cardiovascular: Negative for chest pain, palpitations, orthopnea and leg swelling.  Gastrointestinal: Positive for melena. Negative for abdominal pain, constipation, diarrhea, nausea and vomiting.  Genitourinary: Negative for dysuria, frequency and hematuria.  Musculoskeletal: Negative for back pain, joint pain and neck pain.  Skin:       No acne, rash, or lesions  Neurological: Positive for focal weakness. Negative for dizziness and tremors.  Endo/Heme/Allergies: Negative for polydipsia. Does not bruise/bleed easily.  Psychiatric/Behavioral: Negative for depression. The patient is not nervous/anxious and does not have insomnia.      VITAL SIGNS:   Vitals:   02/11/17 2100 02/11/17 2130 02/11/17 2140 02/11/17 2229  BP: 126/87 129/76  136/74  Pulse:  (!) 119 (!) 108 (!) 121  Resp: (!) 23 19 (!) 22 20  Temp:    98.3 F (36.8 C)  TempSrc:    Oral  SpO2:  99% 99% 100%  Weight:    65 kg (143 lb 3.2 oz)  Height:    5' 6"  (1.676 m)   Wt Readings from Last 3 Encounters:  02/11/17 65 kg (143 lb 3.2 oz)  01/28/17 68.5 kg  (151 lb)  01/18/17 65.2 kg (143 lb 11.2 oz)    PHYSICAL EXAMINATION:  Physical Exam  Vitals reviewed. Constitutional: She is oriented to person, place, and time. She appears well-developed and well-nourished. No distress.  HENT:  Head: Normocephalic and atraumatic.  Mouth/Throat: Oropharynx is clear and moist.  Eyes: Conjunctivae and EOM are normal. Pupils are equal, round, and reactive to light. No scleral icterus.  Neck: Normal range of motion. Neck supple. No JVD present. No thyromegaly present.  Cardiovascular: Normal rate, regular rhythm and intact distal pulses.  Exam reveals no gallop and no friction rub.   No murmur heard. Respiratory: Effort normal and breath sounds normal. No respiratory distress. She has no wheezes. She has no rales.  GI: Soft. Bowel sounds are normal. She exhibits no distension. There is no tenderness.  Musculoskeletal: Normal range of motion. She exhibits no edema.  No arthritis, no gout  Lymphadenopathy:    She has no cervical adenopathy.  Neurological: She is alert and oriented to person, place, and time. No cranial nerve deficit.  No dysarthria, no aphasia, RLE 4/5 strength with right foot dorsiflexion 2/5 strength, other extremities 5/5/ strength.  Skin: Skin is warm and dry. No rash noted. No erythema.  Psychiatric: She has a normal mood and affect. Her behavior is normal. Judgment and thought content normal.    LABORATORY PANEL:   CBC  Recent Labs Lab 02/11/17 1855  WBC 8.6  HGB 11.1*  HCT 33.0*  PLT 108*   ------------------------------------------------------------------------------------------------------------------  Chemistries   Recent Labs Lab 02/11/17 2018  NA 132*  K 3.6  CL 96*  CO2 22  GLUCOSE 91  BUN 5*  CREATININE 0.76  CALCIUM 8.4*  AST 142*  ALT 44  ALKPHOS 319*  BILITOT 2.7*   ------------------------------------------------------------------------------------------------------------------  Cardiac  Enzymes  Recent Labs Lab 02/11/17 2018  TROPONINI 0.06*   ------------------------------------------------------------------------------------------------------------------  RADIOLOGY:  No results found.  EKG:   Orders placed or performed during the hospital encounter of 02/11/17  . ED EKG  . ED EKG    IMPRESSION AND PLAN:  Principal Problem:   GI bleed - hemoglobin stable, blood pressure improved with fluids, we will use PPI, monitor her hemoglobin, get a GI consult, hold anticoagulation Active Problems:   Weakness of right lower extremity - patient's last MRI was some time ago, it is unclear  whether this could be a manifestation of possible stroke versus recurrent or progressive metastatic cancer. We will repeat MRI and get a neurology consult   Hypertension - currently blood pressure is low to low-normal, we will hold antihypertensives for now, IV fluids for blood pressure support   Pulmonary emboli (HCC) - holding anticoagulation for now as above   HLD (hyperlipidemia) - continue home meds   Metastatic breast cancer ([place]) - continue home meds, other workup for possible recurrent or worsening metastatic disease as above  All the records are reviewed and case discussed with ED provider. Management plans discussed with the patient and/or family.  DVT PROPHYLAXIS: Mechanical only  GI PROPHYLAXIS: PPI  ADMISSION STATUS: Inpatient  CODE STATUS: Full    Code Status Orders        Start     Ordered   02/11/17 2221  Full code  Continuous     02/11/17 2221    Code Status History    Date Active Date Inactive Code Status Order ID Comments User Context   01/12/2017  6:23 PM 01/16/2017  4:57 PM Full Code [id number]  Hillary Bow, MD ED   12/04/2016  8:31 PM 12/11/2016  3:30 PM Full Code [id number]  Fritzi Mandes, MD Inpatient   11/10/2016  8:59 AM 11/12/2016  4:00 PM Full Code [id number]  Hillary Bow, MD ED    Advance Directive Documentation     Most Recent Value  Type of  Advance Directive  Healthcare Power of [place], Living will  Pre-existing out of facility DNR order (yellow form or pink MOST form)  -  "MOST" Form in Place?  -      TOTAL TIME TAKING CARE OF THIS PATIENT: 45 minutes.   Caleigha Zale Silt 02/12/2017, 12:26 AM  [facility]  Office  [phone]  CC: Primary care physician; Sarah Nova, MD  Note:  This document was prepared using Dragon voice recognition software and may include unintentional dictation errors.

## 2017-02-12 NOTE — Progress Notes (Signed)
Received call from Earl Lagos at [facility]. Patient had an 10 beat run of ventricular tachycardia. Paged MD. Assessed patient. Patient sleeping. Spoke with Dr. Starla Link. Per Dr. Jannifer Franklin, administer 1g Calcium Gluconate IVPB and continue to monitor.

## 2017-02-12 NOTE — Evaluation (Signed)
Physical Therapy Evaluation Patient Details Name: Sarah Horne MRN: [id number] DOB: Apr 23, 1956 Today's Date: 02/12/2017   History of Present Illness  Pt admitted for GI bleed. History includes metastatic breast cancer with mets to brain currently on radiation and chemo. Per previous chart reivew, may have other mets to bone. History of PE. Pt currently complains of R side weakness and foot drop.  Clinical Impression  Pt is a pleasant 61 year old female who was admitted for GI bleed. Pt performs bed mobility/transfers with independence and ambulation with cga and safe technique using RW. Pt demonstrates deficits with strength/endurance/balance. Pt does demonstrate R foot drop, no other neuro signs noted. Sensation/coordination intact. Pt is close to baseline and reports has order for AFO brace. This would help with fall prevention. Recommend to continue use of RW for safety and balance. Would benefit from skilled PT to address above deficits and promote optimal return to PLOF. Recommend transition to [place] upon discharge from acute hospitalization.       Follow Up Recommendations Home health PT;Supervision for mobility/OOB    Equipment Recommendations  None recommended by PT    Recommendations for Other Services       Precautions / Restrictions Precautions Precautions: Fall Restrictions Weight Bearing Restrictions: No      Mobility  Bed Mobility Overal bed mobility: Independent             General bed mobility comments: safe technique performed without issue  Transfers Overall transfer level: Independent Equipment used: None             General transfer comment: safe technique performed with no AD and upright posture. Once standing, does reach out for railing for assistance.  Ambulation/Gait Ambulation/Gait assistance: Min guard Ambulation Distance (Feet): 40 Feet Assistive device: Rolling walker (2 wheeled) Gait Pattern/deviations: Step-to pattern      General Gait Details: Pt ambulated in hallway with RW with increased R knee flexion secondary to foot drop. Safe technique performed. Further ambulation performed without AD.  Stairs            Wheelchair Mobility    Modified Rankin (Stroke Patients Only)       Balance Overall balance assessment: History of Falls;Needs assistance Sitting-balance support: Feet supported Sitting balance-Leahy Scale: Normal     Standing balance support: Bilateral upper extremity supported Standing balance-Leahy Scale: Good                               Pertinent Vitals/Pain Pain Assessment: No/denies pain    Home Living Family/patient expects to be discharged to:: Private residence Living Arrangements: Alone Available Help at Discharge: Family Type of Home: House Home Access: Stairs to enter Entrance Stairs-Rails: Right Entrance Stairs-Number of Steps: 3 Home Layout: One level Home Equipment: Walker - 2 wheels;Bedside commode;Shower seat      Prior Function Level of Independence: Independent         Comments: PRN use of RW, was working full time until Feb of 2018. Driving.     Hand Dominance        Extremity/Trunk Assessment   Upper Extremity Assessment Upper Extremity Assessment: Overall WFL for tasks assessed    Lower Extremity Assessment Lower Extremity Assessment: Generalized weakness (R hip flexor 5/5; knee flexion 4/5; dorsiflexion 2/5)       Communication   Communication: No difficulties  Cognition Arousal/Alertness: Awake/alert Behavior During Therapy: WFL for tasks assessed/performed Overall Cognitive Status: Within Functional Limits  for tasks assessed                                        General Comments      Exercises Other Exercises Other Exercises: Pt ambulated additional 20' without AD with cga and slight unsteadiness, increased difficulty picking up R foot. Step to gait pattern performed   Assessment/Plan     PT Assessment Patient needs continued PT services  PT Problem List Decreased strength;Decreased activity tolerance;Decreased balance;Decreased mobility;Decreased safety awareness       PT Treatment Interventions Gait training;DME instruction;Therapeutic exercise;Balance training    PT Goals (Current goals can be found in the Care Plan section)  Acute Rehab PT Goals Patient Stated Goal: to feel better PT Goal Formulation: With patient Time For Goal Achievement: 02/26/17 Potential to Achieve Goals: Good    Frequency Min 2X/week   Barriers to discharge Decreased caregiver support      Co-evaluation               AM-PAC PT "6 Clicks" Daily Activity  Outcome Measure Difficulty turning over in bed (including adjusting bedclothes, sheets and blankets)?: None Difficulty moving from lying on back to sitting on the side of the bed? : None Difficulty sitting down on and standing up from a chair with arms (e.g., wheelchair, bedside commode, etc,.)?: None Help needed moving to and from a bed to chair (including a wheelchair)?: None Help needed walking in hospital room?: None Help needed climbing 3-5 steps with a railing? : None 6 Click Score: 24    End of Session Equipment Utilized During Treatment: Gait belt Activity Tolerance: Patient tolerated treatment well Patient left: in chair;with chair alarm set Nurse Communication: Mobility status PT Visit Diagnosis: Muscle weakness (generalized) (M62.81);Difficulty in walking, not elsewhere classified (R26.2)    Time: 4356-8616 PT Time Calculation (min) (ACUTE ONLY): 22 min   Charges:   PT Evaluation $PT Eval Low Complexity: 1 Procedure PT Treatments $Gait Training: 8-22 mins   PT G Codes:        Greggory Stallion, PT, DPT [phone]   Nery Frappier 02/12/2017, 4:47 PM

## 2017-02-12 NOTE — Consult Note (Addendum)
Reason for Consult:RLE weakness Referring Physician: Mody  CC: Right foot drop  HPI: Sarah Horne is an 61 y.o. female with a history of metastatic breast cancer and metastatic lesion to the left temporal lobe on radiation and chemo who was admitted due to GI bleed.  Also reports that for the past 1-2 weeks she has noticed right arm weakness and right foot drop.     Past Medical History:  Diagnosis Date  . Chemotherapy-induced peripheral neuropathy ([place])   . Epistaxis    a. 11/2016 in setting of asa/plavix-->silver nitrate cauterization.  . GI bleed    a. 11/2016 Admission w/ GIB and hypovolemic shock req 3u PRBC's;  b. 11/2016 ECG: gastritis & nonbleeding peptic ulcer; c. 11/2016 Conlonoscopy: rectal and sigmoid colonic ulcers.  . Heart attack ([place])    a. 1998 Cath @ [facility]: reportedly no intervention required.  Marland Kitchen Herceptin-induced cardiomyopathy ([place])    a. In the setting of Herceptin Rx for breast cancer (initiated 12/2014); b. 03/2015 MUGA EF 64%; b. 08/2015 MUGA: EF 51%; c. 10/2015 MUGA: EF 44%; d. 11/2015 Echo: EF 45-50%; e. 01/2016 MUGA: EF 60%; f. 06/2016 MUGA EF 65%; g. 10/2016 MUGA: EF 61%;  h. 12/2016 Echo: EF 55-60%, gr1 DD.  Marland Kitchen Hyperlipidemia   . Hypertension   . Neuropathy   . Possible PAD (peripheral artery disease) ([place])    a. 11/2016 LE cyanosis and weak pulses-->CTA w/o significant Ao-BiFem dzs. ? distal dzs-->ASA/Plavix initiated by vascular surgery.  Marland Kitchen PSVT (paroxysmal supraventricular tachycardia) ([place])    a. Dx 11/2016.  . Pulmonary embolism ([place])    a. 12/2016 CTA Chest: small nonocclusive PE in inferior segment of the Left lingula, somewhat eccentric filling defect suggesting chronic rather than acute embolic event; b. 10/6413 LE U/S:  No DVT; c. 12/2016 Echo: Nl RV fxn, nl PASP.  Marland Kitchen Recurrent Metastatic breast cancer ([place])    a. Dx 2016: Stage II, ER positive, PR positive, HER-2/neu overexpressing of the left breast-->chemo/radiation; b. 10/2016 CT Abd/pelvis: diffuse liver mets,  ill defined sclerotic bone lesions-T12;  c. 10/2016 MRI brain: metastatic lesion along L temporal lobe (19x4m) w/ extensive surrounding edema & 521mmidline shift to right.  . Sinus tachycardia     Past Surgical History:  Procedure Laterality Date  . BREAST BIOPSY Left 2016   Positive  . BREAST LUMPECTOMY WITH SENTINEL LYMPH NODE BIOPSY Left 05/23/2015   Procedure: LEFT BREAST WIDE EXCISION WITH AXILLARY DISSECTION, MASTOPLASTY ;  Surgeon: JeRobert BellowMD;  Location: [facility] ORS;  Service: General;  Laterality: Left;  . BREAST SURGERY Left 12/18/14   breast biopsy/INVASIVE DUCTAL CARCINOMA OF BREAST, NOTTINGHAM GRADE 2.  . Marland KitchenREAST SURGERY  05/23/2015.   Wide excision/mastoplasty, axillary dissection. No residual invasive cancer, positive for residual DCIS. 0/2 nodes identified on axillary dissection. (no SLN by technetium or methylene blue)  . CARDIAC CATHETERIZATION    . COLONOSCOPY WITH PROPOFOL N/A 12/10/2016   Procedure: COLONOSCOPY WITH PROPOFOL;  Surgeon: DaLucilla LameMD;  Location: [facility] ENDOSCOPY;  Service: Endoscopy;  Laterality: N/A;  . ESOPHAGOGASTRODUODENOSCOPY (EGD) WITH PROPOFOL N/A 12/08/2016   Procedure: ESOPHAGOGASTRODUODENOSCOPY (EGD) WITH PROPOFOL;  Surgeon: DaLucilla LameMD;  Location: [facility] ENDOSCOPY;  Service: Endoscopy;  Laterality: N/A;  . PORTACATH PLACEMENT Right 12-31-14   Dr ByBary Castilla  Family History  Problem Relation Age of Onset  . Breast cancer Maternal Aunt   . Breast cancer Cousin   . Brain cancer Maternal Uncle     Social History:  reports  that she has been smoking Cigarettes.  She has a 9.00 pack-year smoking history. She has never used smokeless tobacco. She reports that she does not drink alcohol or use drugs.  Allergies  Allergen Reactions  . No Known Allergies     Medications:  I have reviewed the patient's current medications. Prior to Admission:  Prescriptions Prior to Admission  Medication Sig Dispense Refill Last Dose  . anastrozole  (ARIMIDEX) 1 MG tablet TAKE 1 TABLET DAILY 90 tablet 3 Taking  . aspirin 81 MG tablet Take 81 mg by mouth daily.   Taking  . diphenoxylate-atropine (LOMOTIL) 2.5-0.025 MG tablet Take 1 tablet by mouth 4 (four) times daily as needed for diarrhea or loose stools. Take it along with immodium 60 tablet 0 prn at prn  . DULoxetine (CYMBALTA) 60 MG capsule TAKE 1 CAPSULE (60 MG TOTAL) BY MOUTH DAILY. 30 capsule 0 Taking  . flecainide (TAMBOCOR) 50 MG tablet Take 1 tablet (50 mg total) by mouth 2 (two) times daily. 60 tablet 1 Taking  . KLOR-CON M20 20 MEQ tablet Take 20 mEq by mouth 2 (two) times daily.  3 Taking  . metoprolol tartrate (LOPRESSOR) 25 MG tablet Take 0.5 tablets (12.5 mg total) by mouth 2 (two) times daily. Hold if you're Systolic Blood pressure is less than 100. 90 tablet 3 prn at prn  . ondansetron (ZOFRAN) 8 MG tablet Take 1 tablet (8 mg total) by mouth every 8 (eight) hours as needed for nausea or vomiting (start 3 days; after chemo). 40 tablet 1 prn at prn  . pantoprazole (PROTONIX) 40 MG tablet Take 1 tablet (40 mg total) by mouth 2 (two) times daily. 60 tablet 2 Taking  . polyethylene glycol (MIRALAX / GLYCOLAX) packet Take 17 g by mouth daily. 14 each 0 prn at prn  . rivaroxaban (XARELTO) 10 MG TABS tablet Take 1 tablet (10 mg total) by mouth daily. 30 tablet 1 Taking  . feeding supplement, ENSURE ENLIVE, (ENSURE ENLIVE) LIQD Take 237 mLs by mouth 2 (two) times daily between meals. 237 mL 12 Taking   Scheduled: . anastrozole  1 mg Oral Daily  . DULoxetine  60 mg Oral Daily  . flecainide  50 mg Oral BID  . pantoprazole  40 mg Oral BID  . potassium chloride  40 mEq Oral Once    ROS: History obtained from the patient  General ROS: negative for - chills, fatigue, fever, night sweats, weight gain or weight loss Psychological ROS: negative for - behavioral disorder, hallucinations, memory difficulties, mood swings or suicidal ideation Ophthalmic ROS: negative for - blurry vision,  double vision, eye pain or loss of vision ENT ROS: negative for - epistaxis, nasal discharge, oral lesions, sore throat, tinnitus or vertigo Allergy and Immunology ROS: negative for - hives or itchy/watery eyes Hematological and Lymphatic ROS: negative for - bleeding problems, bruising or swollen lymph nodes Endocrine ROS: negative for - galactorrhea, hair pattern changes, polydipsia/polyuria or temperature intolerance Respiratory ROS: negative for - cough, hemoptysis, shortness of breath or wheezing Cardiovascular ROS: negative for - chest pain, dyspnea on exertion, edema or irregular heartbeat Gastrointestinal ROS: negative for - abdominal pain, diarrhea, hematemesis, nausea/vomiting or stool incontinence Genito-Urinary ROS: negative for - dysuria, hematuria, incontinence or urinary frequency/urgency Musculoskeletal ROS: negative for - joint swelling or muscular weakness Neurological ROS: as noted in HPI Dermatological ROS: negative for rash and skin lesion changes  Physical Examination: Blood pressure (!) 97/51, pulse 96, temperature 98.6 F (37 C), temperature source Oral,  resp. rate 16, height 5' 6"  (1.676 m), weight 65 kg (143 lb 3.2 oz), SpO2 99 %.  HEENT-  Normocephalic, no lesions, without obvious abnormality.  Normal external eye and conjunctiva.  Normal TM's bilaterally.  Normal auditory canals and external ears. Normal external nose, mucus membranes and septum.  Normal pharynx. Cardiovascular- S1, S2 normal, pulses palpable throughout   Lungs- chest clear, no wheezing, rales, normal symmetric air entry Abdomen- soft, non-tender; bowel sounds normal; no masses,  no organomegaly Extremities- no edema Lymph-cervical lymphadenopathy palpated Musculoskeletal-no joint tenderness, deformity or swelling Skin-warm and dry, no hyperpigmentation, vitiligo, or suspicious lesions  Neurological Examination   Mental Status: Alert, oriented, thought content appropriate.  Speech fluent without  evidence of aphasia.  Able to follow 3 step commands without difficulty. Cranial Nerves: II: Discs flat bilaterally; Visual fields grossly normal, pupils equal, round, reactive to light and accommodation III,IV, VI: ptosis not present, extra-ocular motions intact bilaterally V,VII: mild right facial droop, facial light touch sensation normal bilaterally VIII: hearing normal bilaterally IX,X: gag reflex present XI: bilateral shoulder shrug XII: midline tongue extension Motor: Right : Upper extremity   5-/5      Lower extremity   4+/5 with 3-4/5 plantar flexion and extension Left:     Upper extremity   5/5                  Lower extremity   5/5 Tone and bulk:normal tone throughout; no atrophy noted Sensory: Pinprick and light touch intact throughout, bilaterally Deep Tendon Reflexes: 2+ in the upper extremities and absent in the lower extremities. Plantars: Right: mute   Left: downgoing Cerebellar: Normal finger-to-nose and normal heel-to-shin testing bilaterally Gait: not tested due to safety concerns   Laboratory Studies:   Basic Metabolic Panel:  Recent Labs Lab 02/11/17 2018 02/12/17 0219 02/12/17 0743  NA 132* 135  --   K 3.6 3.1*  --   CL 96* 100*  --   CO2 22 25  --   GLUCOSE 91 79  --   BUN 5* 6  --   CREATININE 0.76 0.75  --   CALCIUM 8.4* 7.9*  --   MG  --   --  1.3*    Liver Function Tests:  Recent Labs Lab 02/11/17 2018  AST 142*  ALT 44  ALKPHOS 319*  BILITOT 2.7*  PROT 6.4*  ALBUMIN 2.6*   No results for input(s): LIPASE, AMYLASE in the last 168 hours. No results for input(s): AMMONIA in the last 168 hours.  CBC:  Recent Labs Lab 02/11/17 1855 02/12/17 0219  WBC 8.6 11.7*  HGB 11.1* 9.1*  HCT 33.0* 27.8*  MCV 90.5 89.1  PLT 108* 81*    Cardiac Enzymes:  Recent Labs Lab 02/11/17 2018 02/12/17 0219 02/12/17 0743  TROPONINI 0.06* 0.03* 0.03*    BNP: Invalid input(s): POCBNP  CBG: No results for input(s): GLUCAP in the last  168 hours.  Microbiology: Results for orders placed or performed during the hospital encounter of 01/12/17  Culture, blood (routine x 2)     Status: None   Collection Time: 01/12/17  3:14 PM  Result Value Ref Range Status   Specimen Description BLOOD R PORT  Final   Special Requests BOTTLES DRAWN AEROBIC AND ANAEROBIC BCAV  Final   Culture NO GROWTH 5 DAYS  Final   Report Status 01/17/2017 FINAL  Final  Culture, blood (routine x 2)     Status: None   Collection Time: 01/12/17  4:45  PM  Result Value Ref Range Status   Specimen Description BLOOD R PORT  Final   Special Requests BOTTLES DRAWN AEROBIC AND ANAEROBIC R PORT  Final   Culture NO GROWTH 5 DAYS  Final   Report Status 01/17/2017 FINAL  Final    Coagulation Studies:  Recent Labs  02/11/17 1855  LABPROT 32.4*  INR 3.07    Urinalysis: No results for input(s): COLORURINE, LABSPEC, PHURINE, GLUCOSEU, HGBUR, BILIRUBINUR, KETONESUR, PROTEINUR, UROBILINOGEN, NITRITE, LEUKOCYTESUR in the last 168 hours.  Invalid input(s): APPERANCEUR  Lipid Panel:     Component Value Date/Time   CHOL 136 01/18/2017 0957   TRIG 127 01/18/2017 0957   HDL 19 (L) 01/18/2017 0957   CHOLHDL 7.2 (H) 01/18/2017 0957   VLDL 25 01/18/2017 0957   LDLCALC 92 01/18/2017 0957    HgbA1C:  Lab Results  Component Value Date   HGBA1C 5.6 01/29/2016    Urine Drug Screen:  No results found for: LABOPIA, COCAINSCRNUR, LABBENZ, AMPHETMU, THCU, LABBARB  Alcohol Level: No results for input(s): ETH in the last 168 hours.  Other results: EKG: sinus tachycardia at 133 bpm.  Imaging: Mr Brain Wo Contrast  Result Date: 02/12/2017 CLINICAL DATA:  Right lower extremity weakness. History of metastatic breast cancer. Follow-up. EXAM: MRI HEAD WITHOUT CONTRAST TECHNIQUE: Multiplanar, multiecho pulse sequences of the brain and surrounding structures were obtained without intravenous contrast. COMPARISON:  11/10/2016 FINDINGS: Brain: Today's study was ordered and  performed without contrast. Previously, the patient had an 18 x 19 mm left temporal metastasis with pronounced vasogenic edema. On today's study, the edema is completely resolved. There is a focus of T2 and FLAIR signal remaining in the left temporal lobe measuring 8 x 10 mm in diameter which could represent post treatment gliosis. Cannot completely rule out some residual viable tumor in this location, but this may simply be post treatment gliosis. No new or second lesion is identified on this noncontrast study. The brain is otherwise normal. No hydrocephalus or extra-axial collection. No midline shift. Vascular: Major vessels at the base of the brain show flow. Skull and upper cervical spine: Negative Sinuses/Orbits: Clear/normal Other: None IMPRESSION: Positive response to treatment when compared to the study of February. Today's study is done without contrast. There is a residual 8 x 10 mm focus of T2 and FLAIR signal in the left temporal lobe that may simply be post treatment gliosis. There is no residual edema. Cannot completely rule out residual viable tumor, and further follow-up is warranted. No new lesion is seen. Electronically Signed   By: Nelson Chimes M.D.   On: 02/12/2017 10:38     Assessment/Plan: 61 year old female with metastatic breast cancer and known left temporal lesion who presents with worsening right sided weakness.  MRI of the brain repeated without contrast and reviewed.  It shows improvement in the left temporal mass and edema.  Patient reports no neck or back pain.  No sensory disturbance per patient.  Unclear what right sided findings have been present for some time and which are new.    Recommendations: 1.  Would repeat MRI with contrast.  Would include cervical spine as well.   2.  Plain films of the lumbar spine.   3.  PT/OT consult  Alexis Goodell, MD Neurology [phone] 02/12/2017, 11:05 AM

## 2017-02-13 ENCOUNTER — Encounter: Payer: Self-pay | Admitting: Anesthesiology

## 2017-02-13 ENCOUNTER — Encounter
Admission: EM | Disposition: A | Discharge status: Home Health | Payer: Self-pay | Source: Home / Self Care | Attending: Internal Medicine

## 2017-02-13 ENCOUNTER — Inpatient Hospital Stay: Payer: BLUE CROSS/BLUE SHIELD | Admitting: Anesthesiology

## 2017-02-13 DIAGNOSIS — K921 Melena: Secondary | ICD-10-CM

## 2017-02-13 DIAGNOSIS — K635 Polyp of colon: Secondary | ICD-10-CM

## 2017-02-13 DIAGNOSIS — D125 Benign neoplasm of sigmoid colon: Principal | ICD-10-CM

## 2017-02-13 DIAGNOSIS — D123 Benign neoplasm of transverse colon: Secondary | ICD-10-CM

## 2017-02-13 DIAGNOSIS — D122 Benign neoplasm of ascending colon: Secondary | ICD-10-CM

## 2017-02-13 HISTORY — PX: COLONOSCOPY WITH PROPOFOL: SHX5780

## 2017-02-13 LAB — POTASSIUM
POTASSIUM: 3.1 mmol/L — AB (ref 3.5–5.1)
Potassium: 3.4 mmol/L — ABNORMAL LOW (ref 3.5–5.1)

## 2017-02-13 LAB — CBC
HEMATOCRIT: 25.5 % — AB (ref 35.0–47.0)
Hemoglobin: 8.6 g/dL — ABNORMAL LOW (ref 12.0–16.0)
MCH: 29.8 pg (ref 26.0–34.0)
MCHC: 33.6 g/dL (ref 32.0–36.0)
MCV: 88.5 fL (ref 80.0–100.0)
PLATELETS: 88 10*3/uL — AB (ref 150–440)
RBC: 2.89 MIL/uL — ABNORMAL LOW (ref 3.80–5.20)
RDW: 22.5 % — AB (ref 11.5–14.5)
WBC: 9.9 10*3/uL (ref 3.6–11.0)

## 2017-02-13 LAB — BASIC METABOLIC PANEL
Anion gap: 8 (ref 5–15)
BUN: 7 mg/dL (ref 6–20)
CHLORIDE: 100 mmol/L — AB (ref 101–111)
CO2: 27 mmol/L (ref 22–32)
CREATININE: 0.71 mg/dL (ref 0.44–1.00)
Calcium: 7.8 mg/dL — ABNORMAL LOW (ref 8.9–10.3)
GFR calc Af Amer: >60 mL/min (ref >60)
GFR calc non Af Amer: >60 mL/min (ref >60)
Glucose, Bld: 90 mg/dL (ref 65–99)
POTASSIUM: 2.6 mmol/L — AB (ref 3.5–5.1)
SODIUM: 135 mmol/L (ref 135–145)

## 2017-02-13 LAB — HEMOGLOBIN: Hemoglobin: 8.7 g/dL — ABNORMAL LOW (ref 12.0–16.0)

## 2017-02-13 LAB — MAGNESIUM: Magnesium: 2.1 mg/dL (ref 1.7–2.4)

## 2017-02-13 SURGERY — COLONOSCOPY WITH PROPOFOL
Anesthesia: General

## 2017-02-13 MED ORDER — POTASSIUM CHLORIDE CRYS ER 20 MEQ PO TBCR
40.0000 meq | EXTENDED_RELEASE_TABLET | Freq: Once | ORAL | Status: AC
  Administered 2017-02-13: 40 meq via ORAL
  Filled 2017-02-13: qty 2

## 2017-02-13 MED ORDER — LACTATED RINGERS IV SOLN
INTRAVENOUS | Status: DC | PRN
  Administered 2017-02-13: 12:00:00 via INTRAVENOUS
  Administered 2017-02-13: 1000 mL

## 2017-02-13 MED ORDER — POTASSIUM CHLORIDE 10 MEQ/100ML IV SOLN
10.0000 meq | INTRAVENOUS | Status: AC
  Administered 2017-02-13 (×4): 10 meq via INTRAVENOUS
  Filled 2017-02-13 (×4): qty 100

## 2017-02-13 MED ORDER — CHLORHEXIDINE GLUCONATE 0.12 % MT SOLN
15.0000 mL | Freq: Two times a day (BID) | OROMUCOSAL | Status: DC
  Administered 2017-02-13 – 2017-02-14 (×4): 15 mL via OROMUCOSAL
  Filled 2017-02-13 (×4): qty 15

## 2017-02-13 MED ORDER — PROPOFOL 500 MG/50ML IV EMUL
INTRAVENOUS | Status: DC | PRN
  Administered 2017-02-13: 120 ug/kg/min via INTRAVENOUS

## 2017-02-13 MED ORDER — ORAL CARE MOUTH RINSE
15.0000 mL | Freq: Two times a day (BID) | OROMUCOSAL | Status: DC
  Administered 2017-02-13 – 2017-02-15 (×5): 15 mL via OROMUCOSAL

## 2017-02-13 MED ORDER — PROPOFOL 10 MG/ML IV BOLUS
INTRAVENOUS | Status: DC | PRN
  Administered 2017-02-13 (×2): 20 mg via INTRAVENOUS
  Administered 2017-02-13: 50 mg via INTRAVENOUS

## 2017-02-13 MED ORDER — POTASSIUM CHLORIDE CRYS ER 20 MEQ PO TBCR
40.0000 meq | EXTENDED_RELEASE_TABLET | Freq: Two times a day (BID) | ORAL | Status: DC
  Administered 2017-02-13 – 2017-02-14 (×4): 40 meq via ORAL
  Filled 2017-02-13 (×4): qty 2

## 2017-02-13 MED ORDER — PROPOFOL 500 MG/50ML IV EMUL
INTRAVENOUS | Status: AC
  Filled 2017-02-13: qty 50

## 2017-02-13 MED ORDER — PHENYLEPHRINE HCL 10 MG/ML IJ SOLN
INTRAMUSCULAR | Status: DC | PRN
  Administered 2017-02-13 (×4): 100 ug via INTRAVENOUS

## 2017-02-13 NOTE — Progress Notes (Signed)
[facility] NAME: Sarah Horne    MR#:  [id number]  DATE OF BIRTH:  Sep 19, 1956  SUBJECTIVE:   Patient had 2 bloody bowel movements last night. She denies lightheadedness or dizziness. She has almost completed prep  REVIEW OF SYSTEMS:    Review of Systems  Constitutional: Negative for fever, chills weight loss HENT: Negative for ear pain, nosebleeds, congestion, facial swelling, rhinorrhea, neck pain, neck stiffness and ear discharge.   Respiratory: Negative for cough, shortness of breath, wheezing  Cardiovascular: Negative for chest pain, palpitations and leg swelling.  Gastrointestinal: Negative for heartburn, abdominal pain, vomiting, diarrhea or consitpation Bloody stools as above Genitourinary: Negative for dysuria, urgency, frequency, hematuria Musculoskeletal: Negative for back pain or joint pain Neurological: Negative for dizziness, seizures, syncope,   numbness and headaches.   Right leg weakness Improving Hematological: Does not bruise/bleed easily.  Psychiatric/Behavioral: Negative for hallucinations, confusion, dysphoric mood    Tolerating Diet: npo     DRUG ALLERGIES:   Allergies  Allergen Reactions  . No Known Allergies     VITALS:  Blood pressure (!) 112/56, pulse 88, temperature 98 F (36.7 C), temperature source Oral, resp. rate 16, height '5\' 6"'$  (1.676 m), weight 65 kg (143 lb 3.2 oz), SpO2 98 %.  PHYSICAL EXAMINATION:  Constitutional: Appears well-developed and well-nourished. No distress. HENT: Normocephalic. Marland Kitchen Oropharynx is clear and moist.  Eyes: Conjunctivae and EOM are normal. PERRLA, no scleral icterus.  Neck: Normal ROM. Neck supple. No JVD. No tracheal deviation. CVS: RRR, S1/S2 +, no murmurs, no gallops, no carotid bruit.  Pulmonary: Effort and breath sounds normal, no stridor, rhonchi, wheezes, rales.  Abdominal: Soft. BS +,  no distension, tenderness, rebound or guarding.   Musculoskeletal: Normal range of motion. No edema and no tenderness.  Neuro: Alert. CN 2-12 grossly intact. No focal deficits.Strength is 5 out of 5 bilaterally and symmetrically Skin: Skin is warm and dry. No rash noted. Psychiatric: Normal mood and affect.      LABORATORY PANEL:   CBC  Recent Labs Lab 02/13/17 0446  WBC 9.9  HGB 8.6*  HCT 25.5*  PLT 88*   ------------------------------------------------------------------------------------------------------------------  Chemistries   Recent Labs Lab 02/11/17 2018  02/12/17 0743 02/13/17 0446  NA 132*  < >  --  135  K 3.6  < >  --  2.6*  CL 96*  < >  --  100*  CO2 22  < >  --  27  GLUCOSE 91  < >  --  90  BUN 5*  < >  --  7  CREATININE 0.76  < >  --  0.71  CALCIUM 8.4*  < >  --  7.8*  MG  --   --  1.3*  --   AST 142*  --   --   --   ALT 44  --   --   --   ALKPHOS 319*  --   --   --   BILITOT 2.7*  --   --   --   < > = values in this interval not displayed. ------------------------------------------------------------------------------------------------------------------  Cardiac Enzymes  Recent Labs Lab 02/11/17 2018 02/12/17 0219 02/12/17 0743  TROPONINI 0.06* 0.03* 0.03*   ------------------------------------------------------------------------------------------------------------------  RADIOLOGY:  Dg Lumbar Spine 2-3 Views  Result Date: 02/12/2017 CLINICAL DATA:  Right lower extremity weakness for several days EXAM: LUMBAR SPINE - 2-3 VIEW COMPARISON:  None. FINDINGS: Frontal, lateral, and spot lumbosacral lateral images  were obtained. There are 5 non-rib-bearing lumbar type vertebral bodies. There is no fracture or spondylolisthesis. There is mild disc space narrowing at L4-5. The disc spaces appear unremarkable. No erosive change. There is aortic atherosclerosis. Calcification in the pelvis is felt to represent calcified uterine leiomyomas. IMPRESSION: No fracture or spondylolisthesis. Disc space  narrowing L4-5. Other disc spaces appear unremarkable. Calcified uterine leiomyomas.  Aortic atherosclerosis. Electronically Signed   By: Lowella Grip III M.D.   On: 02/12/2017 12:07   Mr Brain Wo Contrast  Result Date: 02/12/2017 CLINICAL DATA:  Right lower extremity weakness. History of metastatic breast cancer. Follow-up. EXAM: MRI HEAD WITHOUT CONTRAST TECHNIQUE: Multiplanar, multiecho pulse sequences of the brain and surrounding structures were obtained without intravenous contrast. COMPARISON:  11/10/2016 FINDINGS: Brain: Today's study was ordered and performed without contrast. Previously, the patient had an 18 x 19 mm left temporal metastasis with pronounced vasogenic edema. On today's study, the edema is completely resolved. There is a focus of T2 and FLAIR signal remaining in the left temporal lobe measuring 8 x 10 mm in diameter which could represent post treatment gliosis. Cannot completely rule out some residual viable tumor in this location, but this may simply be post treatment gliosis. No new or second lesion is identified on this noncontrast study. The brain is otherwise normal. No hydrocephalus or extra-axial collection. No midline shift. Vascular: Major vessels at the base of the brain show flow. Skull and upper cervical spine: Negative Sinuses/Orbits: Clear/normal Other: None IMPRESSION: Positive response to treatment when compared to the study of February. Today's study is done without contrast. There is a residual 8 x 10 mm focus of T2 and FLAIR signal in the left temporal lobe that may simply be post treatment gliosis. There is no residual edema. Cannot completely rule out residual viable tumor, and further follow-up is warranted. No new lesion is seen. Electronically Signed   By: Nelson Chimes M.D.   On: 02/12/2017 10:38     ASSESSMENT AND PLAN:   61 year old female with history of metastatic breast cancer undergoing chemotherapy, recent pulmonary emboli on anticoagulation, past  history of GI bleed who presents with melena and right foot weakness.  1. GI bleed: Continue PPI EGD in March showed gastritis and nonbleeding peptic ulcer and colonoscopy showed some rectal and sigmoid colonic ulcersWhich were treated with endoscopic clipping  Plan for colonoscopy today   2. Right foot drop/weakness: MRI shows no evidence of CVA and x-ray shows improvement in brain metastatic disease without cerebral edema Will discuss with Dr. Doy Mince for need of MRI with contrast  Concerns for foot drop/neuropathy from- [place]  3. Recent pulmonary emboli on anticoagulation which is being held due to problem #1  Patient may need IVC filter placed Vascular surgery consult pending  4. Recurrent metastatic breast cancer: Oncology evaluation appreciated  5. Hypokalemia: This is to be repleted prior to colonoscopy. 6. Elevated troponin due to demand ischemia. Patient has ruled out for non-ST elevation MI  7. Acute on chronic anemia from GI bleed Continue to monitor CBC No indication for blood transfusion today.    Management plans discussed with the patient and she is in agreement.  CODE STATUS: fu;;  TOTAL TIME TAKING CARE OF THIS PATIENT: 27 minutes.     POSSIBLE D/C 2 days, DEPENDING ON CLINICAL CONDITION.   Yareni Creps M.D on 02/13/2017 at 7:06 AM  Between 7am to 6pm - Pager - [phone] After 6pm go to [email or web address] - [facility]  Office  [phone]  CC: Primary care physician; [name], MD  Note: This dictation was prepared with Dragon dictation along with smaller phrase technology. Any transcriptional errors that result from this process are unintentional.

## 2017-02-13 NOTE — Progress Notes (Addendum)
Pharmacy note  Electrolyte replacement  5/19 AM K+ 2.6. 10 mEq KCl IV x 4 doses ordered by MD. Patient currently on 20 mEq KCl PO BID. Changing to 40 mEq PO BID. Recheck BMP and magnesium with tomorrow AM labs.  5/19 1723: K+ 3.1. Will order KCL 80mq x 1 dose. Patient also receiving KCL 40 mEq BID. Will recheck electrolytes with AM labs and replace as needed.   SPernell Dupre PharmD, BCPS Clinical Pharmacist 02/13/2017 6:25 PM   5/10 AM K+ 3.1 and Ca 7.7 (8.8 corrected) Additional 40 mEq KCl PO ordered for total dose of 120 mEq today and CaCO3 500 TID x 3 doses ordered. Recheck BMP and Mg with tomorrow AM labs.  MSim Boast PharmD, BCPS  02/14/17 6:42 AM

## 2017-02-13 NOTE — Transfer of Care (Signed)
Immediate Anesthesia Transfer of Care Note  Patient: Sarah Horne  Procedure(s) Performed: Procedure(s): COLONOSCOPY WITH PROPOFOL (N/A)  Patient Location: PACU  Anesthesia Type:General  Level of Consciousness: awake  Airway & Oxygen Therapy: Patient Spontanous Breathing and Patient connected to face mask oxygen  Post-op Assessment: Report given to RN and Post -op Vital signs reviewed and stable  Post vital signs: Reviewed  Last Vitals:  Vitals:   02/13/17 1225 02/13/17 1321  BP: 119/68 (!) 98/44  Pulse: (!) 121 (!) 112  Resp:  18  Temp: (!) 35.6 C     Last Pain:  Vitals:   02/13/17 1225  TempSrc: Tympanic  PainSc:          Complications: No apparent anesthesia complications

## 2017-02-13 NOTE — Anesthesia Postprocedure Evaluation (Signed)
Anesthesia Post Note  Patient: Sarah Horne  Procedure(s) Performed: Procedure(s) (LRB): COLONOSCOPY WITH PROPOFOL (N/A)  Patient location during evaluation: PACU Anesthesia Type: General Level of consciousness: awake and alert and oriented Pain management: pain level controlled Vital Signs Assessment: post-procedure vital signs reviewed and stable Respiratory status: spontaneous breathing Cardiovascular status: blood pressure returned to baseline Anesthetic complications: no     Last Vitals:  Vitals:   02/13/17 1320 02/13/17 1321  BP: (!) 98/44 (!) 98/44  Pulse: (!) 112 (!) 112  Resp: 14 18  Temp: (!) 35.6 C     Last Pain:  Vitals:   02/13/17 1320  TempSrc: Tympanic  PainSc:                  Jeanean Hollett

## 2017-02-13 NOTE — Progress Notes (Signed)
Patient had a potassium critical value of 2.6. Spoke with Dr. Marcille Blanco, MD regarding lab value. IV potassium replacement ordered by Dr. Marcille Blanco. First dose administered.

## 2017-02-13 NOTE — Progress Notes (Signed)
Patient had 2 liquid, bloody stools - 2316 and 2326. Dr. Jannifer Franklin, MD notified. Hemoglobin ordered, specimen collected. Lab result of 8.7. Will continue to monitor.

## 2017-02-13 NOTE — Anesthesia Preprocedure Evaluation (Addendum)
Anesthesia Evaluation  Patient identified by MRN, date of birth, ID band Patient awake    Reviewed: Allergy & Precautions, NPO status , Patient's Chart, lab work & pertinent test results  History of Anesthesia Complications Negative for: history of anesthetic complications  Airway Mallampati: II  TM Distance: >3 FB Neck ROM: Full    Dental  (+) Poor Dentition   Pulmonary neg sleep apnea, neg COPD, Current Smoker, PE   breath sounds clear to auscultation- rhonchi (-) wheezing      Cardiovascular hypertension, Pt. 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  Now recovering with most recent EF 55-60%   Neuro/Psych  Neuromuscular disease (peripheral neuropathy from chemo) negative psych ROS   GI/Hepatic Neg liver ROS, PUD, GIB    Endo/Other  negative endocrine ROSneg diabetes  Renal/GU negative Renal ROS     Musculoskeletal negative musculoskeletal ROS (+)   Abdominal (+) - obese,   Peds  Hematology  (+) anemia ,   Anesthesia Other Findings Past Medical History: 2016: Breast cancer ([place])     Comment: Left-radiation 2016: Cancer ([place])     Comment: Stage II, ER positive, PR positive, HER-2/neu               overexpressing of the left breast. No date: Chemotherapy-induced peripheral neuropathy ([facility]* 1998: Heart attack No date: Hyperlipidemia No date: Hypertension No date: Neuromuscular disorder ([place])     Comment: neuropathy No date: Neuropathy (HCC)   Reproductive/Obstetrics                            Anesthesia Physical  Anesthesia Plan  ASA: III  Anesthesia Plan: General   Post-op Pain Management:    Induction: Intravenous  Airway Management Planned: Nasal  Cannula  Additional Equipment:   Intra-op Plan:   Post-operative Plan:   Informed Consent: I have reviewed the patients History and Physical, chart, labs and discussed the procedure including the risks, benefits and alternatives for the proposed anesthesia with the patient or authorized representative who has indicated his/her understanding and acceptance.   Dental advisory given  Plan Discussed with: CRNA and Anesthesiologist  Anesthesia Plan Comments:        Anesthesia Quick Evaluation

## 2017-02-13 NOTE — Anesthesia Post-op Follow-up Note (Cosign Needed)
Anesthesia QCDR form completed.        

## 2017-02-13 NOTE — Op Note (Signed)
[facility]ology Patient Name: Sarah Horne Procedure Date: 02/13/2017 12:25 PM MRN: [id number] Account #: [id number] Date of Birth: 06-Jul-1956 Admit Type: Inpatient Age: 61 Room: [facility] ENDO ROOM 4 Gender: Female Note Status: Finalized Procedure:            Colonoscopy Indications:          Hematochezia Providers:            Lucilla Lame MD, MD Referring MD:         Otila Back. Manuella Ghazi (Referring MD) Medicines:            Propofol per Anesthesia Complications:        No immediate complications. Procedure:            Pre-Anesthesia Assessment:                       - Prior to the procedure, a History and Physical was                        performed, and patient medications and allergies were                        reviewed. The patient's tolerance of previous                        anesthesia was also reviewed. The risks and benefits of                        the procedure and the sedation options and risks were                        discussed with the patient. All questions were                        answered, and informed consent was obtained. Prior                        Anticoagulants: The patient has taken Xarelto                        (rivaroxaban). ASA Grade Assessment: III - A patient                        with severe systemic disease. After reviewing the risks                        and benefits, the patient was deemed in satisfactory                        condition to undergo the procedure.                       After obtaining informed consent, the colonoscope was                        passed under direct vision. Throughout the procedure,                        the patient's blood pressure, pulse, and oxygen  saturations were monitored continuously. The                        Colonoscope was introduced through the anus and                        advanced to the the cecum, identified by appendiceal   orifice and ileocecal valve. The colonoscopy was                        performed without difficulty. The patient tolerated the                        procedure well. The quality of the bowel preparation                        was good. Findings:      The perianal and digital rectal examinations were normal.      A few sessile polyps were found in the sigmoid colon, transverse colon       and ascending colon. The polyps were 4 to 5 mm in size. Coagulation for       hemostasis using argon beam at 2 liters/minute and 30 watts was       successful.      Non-bleeding internal hemorrhoids were found during retroflexion. The       hemorrhoids were Grade I (internal hemorrhoids that do not prolapse).      All the polyps were oozing blood. Impression:           - A few 4 to 5 mm polyps in the sigmoid colon, in the                        transverse colon and in the ascending colon. Treated                        with argon beam coagulation.                       - Non-bleeding internal hemorrhoids.                       - All the polyps were oozing blood.                       - No specimens collected. Recommendation:       - The patient should avoid anticoagulation if possible                        if not then avoid multiple anti coagulation medications. Procedure Code(s):    --- Professional ---                       [phone], Colonoscopy, flexible; with control of bleeding,                        any method Diagnosis Code(s):    --- Professional ---                       K92.1, Melena (includes Hematochezia)  D12.5, Benign neoplasm of sigmoid colon                       D12.3, Benign neoplasm of transverse colon (hepatic                        flexure or splenic flexure)                       D12.2, Benign neoplasm of ascending colon CPT copyright 2016 American Medical Association. All rights reserved. The codes documented in this report are preliminary and upon coder  review may  be revised to meet current compliance requirements. Lucilla Lame MD, MD 02/13/2017 1:23:27 PM This report has been signed electronically. Number of Addenda: 0 Note Initiated On: 02/13/2017 12:25 PM Scope Withdrawal Time: 0 hours 11 minutes 23 seconds  Total Procedure Duration: 0 hours 19 minutes 16 seconds       [facility]

## 2017-02-13 NOTE — Progress Notes (Signed)
Pharmacy note  Electrolyte replacement 5/19 AM K+ 2.6. 10 mEq KCl IV x 4 doses ordered by MD. Patient currently on 20 mEq KCl PO BID. Changing to 40 mEq PO BID. Recheck BMP and magnesium with tomorrow AM labs.

## 2017-02-14 DIAGNOSIS — I2699 Other pulmonary embolism without acute cor pulmonale: Secondary | ICD-10-CM

## 2017-02-14 LAB — BASIC METABOLIC PANEL
Anion gap: 5 (ref 5–15)
BUN: 7 mg/dL (ref 6–20)
CHLORIDE: 101 mmol/L (ref 101–111)
CO2: 27 mmol/L (ref 22–32)
Calcium: 7.7 mg/dL — ABNORMAL LOW (ref 8.9–10.3)
Creatinine, Ser: 0.81 mg/dL (ref 0.44–1.00)
GFR calc Af Amer: >60 mL/min (ref >60)
GFR calc non Af Amer: >60 mL/min (ref >60)
Glucose, Bld: 86 mg/dL (ref 65–99)
POTASSIUM: 3.1 mmol/L — AB (ref 3.5–5.1)
SODIUM: 133 mmol/L — AB (ref 135–145)

## 2017-02-14 LAB — CBC
HEMATOCRIT: 25.6 % — AB (ref 35.0–47.0)
HEMOGLOBIN: 8.6 g/dL — AB (ref 12.0–16.0)
MCH: 30.4 pg (ref 26.0–34.0)
MCHC: 33.5 g/dL (ref 32.0–36.0)
MCV: 90.8 fL (ref 80.0–100.0)
Platelets: 85 10*3/uL — ABNORMAL LOW (ref 150–440)
RBC: 2.82 MIL/uL — AB (ref 3.80–5.20)
RDW: 23 % — ABNORMAL HIGH (ref 11.5–14.5)
WBC: 8.1 10*3/uL (ref 3.6–11.0)

## 2017-02-14 LAB — MAGNESIUM: Magnesium: 1.7 mg/dL (ref 1.7–2.4)

## 2017-02-14 MED ORDER — POTASSIUM CHLORIDE CRYS ER 20 MEQ PO TBCR: 40.0000 meq | EXTENDED_RELEASE_TABLET | Freq: Once | ORAL | Status: DC

## 2017-02-14 MED ORDER — CALCIUM CARBONATE ANTACID 500 MG PO CHEW
1.0000 | CHEWABLE_TABLET | Freq: Three times a day (TID) | ORAL | Status: AC
  Administered 2017-02-14 (×3): 200 mg via ORAL
  Filled 2017-02-14 (×3): qty 1

## 2017-02-14 NOTE — Consult Note (Signed)
[facility] [place]  Patient name: Sarah Horne MRN: [id number] DOB: June 08, 1956 Sex: female   REFERRING PROVIDER:    Dr. Benjie Karvonen   REASON FOR CONSULT:    IVC filter  HISTORY OF PRESENT ILLNESS:   Sarah Horne is a 61 y.o. female, who presented on 5-17 with melena.  She has a history of a PE and was on anticoagulation.  Colonoscopy identified several polyps that were oozing and treated.  Anticoagulation was recommended to be discontinued.  She does not have a history of DVT.  The etiology of her PE is unknown.  She has recurrent metastatic breast cancer and is currently undergoing treatment  She has a history of MI, s/p cath in 1998 without intervention.  She is medically managed for hypercholesterolemia and hypertension.  She is a smoker  PAST MEDICAL HISTORY    Past Medical History:  Diagnosis Date  . Chemotherapy-induced peripheral neuropathy ([place])   . Epistaxis    a. 11/2016 in setting of asa/plavix-->silver nitrate cauterization.  . GI bleed    a. 11/2016 Admission w/ GIB and hypovolemic shock req 3u PRBC's;  b. 11/2016 ECG: gastritis & nonbleeding peptic ulcer; c. 11/2016 Conlonoscopy: rectal and sigmoid colonic ulcers.  . Heart attack ([place])    a. 1998 Cath @ [facility]: reportedly no intervention required.  Marland Kitchen Herceptin-induced cardiomyopathy ([place])    a. In the setting of Herceptin Rx for breast cancer (initiated 12/2014); b. 03/2015 MUGA EF 64%; b. 08/2015 MUGA: EF 51%; c. 10/2015 MUGA: EF 44%; d. 11/2015 Echo: EF 45-50%; e. 01/2016 MUGA: EF 60%; f. 06/2016 MUGA EF 65%; g. 10/2016 MUGA: EF 61%;  h. 12/2016 Echo: EF 55-60%, gr1 DD.  Marland Kitchen Hyperlipidemia   . Hypertension   . Neuropathy   . Possible PAD (peripheral artery disease) ([place])    a. 11/2016 LE cyanosis and weak pulses-->CTA w/o significant Ao-BiFem dzs. ? distal dzs-->ASA/Plavix initiated by vascular surgery.  Marland Kitchen PSVT (paroxysmal supraventricular tachycardia) ([place])    a. Dx  11/2016.  . Pulmonary embolism ([place])    a. 12/2016 CTA Chest: small nonocclusive PE in inferior segment of the Left lingula, somewhat eccentric filling defect suggesting chronic rather than acute embolic event; b. 05/5620 LE U/S:  No DVT; c. 12/2016 Echo: Nl RV fxn, nl PASP.  Marland Kitchen Recurrent Metastatic breast cancer ([place])    a. Dx 2016: Stage II, ER positive, PR positive, HER-2/neu overexpressing of the left breast-->chemo/radiation; b. 10/2016 CT Abd/pelvis: diffuse liver mets, ill defined sclerotic bone lesions-T12;  c. 10/2016 MRI brain: metastatic lesion along L temporal lobe (19x59m) w/ extensive surrounding edema & 577mmidline shift to right.  . Sinus tachycardia      FAMILY HISTORY   Family History  Problem Relation Age of Onset  . Breast cancer Maternal Aunt   . Breast cancer Cousin   . Brain cancer Maternal Uncle     SOCIAL HISTORY:   Social History   Social History  . Marital status: Divorced    Spouse name: N/A  . Number of children: N/A  . Years of education: N/A   Occupational History  . Not on file.   Social History Main Topics  . Smoking status: Light Tobacco Smoker    Packs/day: 0.50    Years: 18.00    Types: Cigarettes  . Smokeless tobacco: Never Used  . Alcohol use No  . Drug use: No  . Sexual activity: Not on file   Other Topics Concern  . Not  on file   Social History Narrative   Lives in [place] by herself.    ALLERGIES:    Allergies  Allergen Reactions  . No Known Allergies     CURRENT MEDICATIONS:    Current Facility-Administered Medications  Medication Dose Route Frequency Provider Last Rate Last Dose  . acetaminophen (TYLENOL) tablet 650 mg  650 mg Oral Q6H PRN Lance Coon, MD       Or  . acetaminophen (TYLENOL) suppository 650 mg  650 mg Rectal Q6H PRN Lance Coon, MD      . anastrozole (ARIMIDEX) tablet 1 mg  1 mg Oral Daily Lance Coon, MD   1 mg at 02/14/17 0856  . chlorhexidine (PERIDEX) 0.12 % solution 15 mL  15 mL Mouth  Rinse BID Bettey Costa, MD   15 mL at 02/14/17 0856  . DULoxetine (CYMBALTA) DR capsule 60 mg  60 mg Oral Daily Lance Coon, MD   60 mg at 02/14/17 0856  . flecainide (TAMBOCOR) tablet 50 mg  50 mg Oral BID Lance Coon, MD   50 mg at 02/14/17 0906  . MEDLINE mouth rinse  15 mL Mouth Rinse q12n4p Mody, Sital, MD   15 mL at 02/14/17 1640  . ondansetron (ZOFRAN) tablet 4 mg  4 mg Oral Q6H PRN Lance Coon, MD       Or  . ondansetron [facility]) injection 4 mg  4 mg Intravenous Q6H PRN Lance Coon, MD      . pantoprazole (PROTONIX) EC tablet 40 mg  40 mg Oral BID Lance Coon, MD   40 mg at 02/14/17 0856  . potassium chloride SA (K-DUR,KLOR-CON) CR tablet 40 mEq  40 mEq Oral BID Bettey Costa, MD   40 mEq at 02/14/17 0856  . potassium chloride SA (K-DUR,KLOR-CON) CR tablet 40 mEq  40 mEq Oral Once Bettey Costa, MD       Facility-Administered Medications Ordered in Other Encounters  Medication Dose Route Frequency Provider Last Rate Last Dose  . 0.9 %  sodium chloride infusion   Intravenous Continuous Pandit, Sandeep, MD      . 0.9 %  sodium chloride infusion   Intravenous Continuous Lloyd Huger, MD 999 mL/hr at 04/24/15 1520    . heparin lock flush 100 unit/mL  500 Units Intravenous Once Berenzon, Dmitriy, MD      . sodium chloride 0.9 % injection 10 mL  10 mL Intravenous PRN [name], MD   10 mL at 04/04/15 1440  . sodium chloride flush (NS) 0.9 % injection 10 mL  10 mL Intravenous PRN Berenzon, Dmitriy, MD      . Tbo-Filgrastim (GRANIX) injection 480 mcg  480 mcg Subcutaneous Once Cammie Sickle, MD        REVIEW OF SYSTEMS:   [X]  denotes positive finding, [ ]  denotes negative finding Cardiac  Comments:  Chest pain or chest pressure:    Shortness of breath upon exertion:    Short of breath when lying flat:    Irregular heart rhythm:        Vascular    Pain in calf, thigh, or hip brought on by ambulation:    Pain in feet at night that wakes you up from your sleep:      Blood clot in your veins:    Leg swelling:         Pulmonary    Oxygen at home:    Productive cough:     Wheezing:  Neurologic    Sudden weakness in arms or legs:  x   Sudden numbness in arms or legs:  x   Sudden onset of difficulty speaking or slurred speech:    Temporary loss of vision in one eye:     Problems with dizziness:         Gastrointestinal    Blood in stool:  x    Vomited blood:         Genitourinary    Burning when urinating:     Blood in urine:        Psychiatric    Major depression:         Hematologic    Bleeding problems:    Problems with blood clotting too easily:        Skin    Rashes or ulcers:        Constitutional    Fever or chills:     PHYSICAL EXAM:   Vitals:   02/13/17 1516 02/13/17 1943 02/14/17 0408 02/14/17 1616  BP:  106/61 (!) 105/51 116/61  Pulse:  (!) 106 89 (!) 108  Resp:    20  Temp:  98.5 F (36.9 C) 98 F (36.7 C) 98.7 F (37.1 C)  TempSrc:  Oral Oral Oral  SpO2: 97% 96% 100% 97%  Weight:      Height:        GENERAL: The patient is a well-nourished female, in no acute distress. The vital signs are documented above. CARDIAC: There is a regular rate and rhythm.  VASCULAR: no leg edema PULMONARY: Nonlabored respirations ABDOMEN: Soft and non-tender with normal pitched bowel sounds.  MUSCULOSKELETAL: There are no major deformities or cyanosis. NEUROLOGIC: No focal weakness or paresthesias are detected. SKIN: There are no ulcers or rashes noted. PSYCHIATRIC: The patient has a normal affect.  STUDIES:   none  ASSESSMENT and PLAN   The patient has a history of PE seen on CTA on 01-13-2017.  A LE venous duplex was negative for DVT.  A IVC filter haas been requested because she can not be anticoagulated for her PE given her GIB.  I discussed the details of the filter placement including the risk of filter thrombosis.  I also discussed that we don't know where her PE originated from, therefore, a filter may  not be protective.  We also discussed the possibility of filter removal once she recovers from her current illness.  This will be done tomorrow.  NPO after midnight   Annamarie Major, MD [facility] [phone] Pager [phone]

## 2017-02-14 NOTE — Plan of Care (Signed)
Problem: Nutrition: Goal: Adequate nutrition will be maintained Outcome: Not Progressing Pt with thrush (medications ordered in [facility]); causing poor appetite

## 2017-02-14 NOTE — Progress Notes (Signed)
[place] at [place] NAME: Sarah Horne    MR#:  [id number]  DATE OF BIRTH:  May 31, 1956  SUBJECTIVE:   Patient Had colonoscopy showing several polyps that were oozing blood Patient denies bloody bowel movement this morning  REVIEW OF SYSTEMS:    Review of Systems  Constitutional: Negative for fever, chills weight loss HENT: Negative for ear pain, nosebleeds, congestion, facial swelling, rhinorrhea, neck pain, neck stiffness and ear discharge.   Respiratory: Negative for cough, shortness of breath, wheezing  Cardiovascular: Negative for chest pain, palpitations and leg swelling.  Gastrointestinal: Negative for heartburn, abdominal pain, vomiting, diarrhea or consitpation Bloody stools as above Genitourinary: Negative for dysuria, urgency, frequency, hematuria Musculoskeletal: Negative for back pain or joint pain Neurological: Negative for dizziness, seizures, syncope,   numbness and headaches.   Right leg weakness Improving Hematological: Does not bruise/bleed easily.  Psychiatric/Behavioral: Negative for hallucinations, confusion, dysphoric mood    Tolerating Diet: yes    DRUG ALLERGIES:   Allergies  Allergen Reactions  . No Known Allergies     VITALS:  Blood pressure (!) 105/51, pulse 89, temperature 98 F (36.7 C), temperature source Oral, resp. rate 16, height '5\' 6"'$  (1.676 m), weight 65 kg (143 lb 3.2 oz), SpO2 100 %.  PHYSICAL EXAMINATION:  Constitutional: Appears well-developed and well-nourished. No distress. HENT: Normocephalic. Marland Kitchen Oropharynx is clear and moist.  Eyes: Conjunctivae and EOM are normal. PERRLA, no scleral icterus.  Neck: Normal ROM. Neck supple. No JVD. No tracheal deviation. CVS: RRR, S1/S2 +, no murmurs, no gallops, no carotid bruit.  Pulmonary: Effort and breath sounds normal, no stridor, rhonchi, wheezes, rales.  Abdominal: Soft. BS +,  no distension, tenderness, rebound or guarding.   Musculoskeletal: Normal range of motion. No edema and no tenderness.  Neuro: Alert. CN 2-12 grossly intact. No focal deficits.Strength is 5 out of 5 bilaterally and symmetrically Skin: Skin is warm and dry. No rash noted. Psychiatric: Normal mood and affect.      LABORATORY PANEL:   CBC  Recent Labs Lab 02/14/17 0450  WBC 8.1  HGB 8.6*  HCT 25.6*  PLT 85*   ------------------------------------------------------------------------------------------------------------------  Chemistries   Recent Labs Lab 02/11/17 2018  02/14/17 0450  NA 132*  < > 133*  K 3.6  < > 3.1*  CL 96*  < > 101  CO2 22  < > 27  GLUCOSE 91  < > 86  BUN 5*  < > 7  CREATININE 0.76  < > 0.81  CALCIUM 8.4*  < > 7.7*  MG  --   < > 1.7  AST 142*  --   --   ALT 44  --   --   ALKPHOS 319*  --   --   BILITOT 2.7*  --   --   < > = values in this interval not displayed. ------------------------------------------------------------------------------------------------------------------  Cardiac Enzymes  Recent Labs Lab 02/11/17 2018 02/12/17 0219 02/12/17 0743  TROPONINI 0.06* 0.03* 0.03*   ------------------------------------------------------------------------------------------------------------------  RADIOLOGY:  Dg Lumbar Spine 2-3 Views  Result Date: 02/12/2017 CLINICAL DATA:  Right lower extremity weakness for several days EXAM: LUMBAR SPINE - 2-3 VIEW COMPARISON:  None. FINDINGS: Frontal, lateral, and spot lumbosacral lateral images were obtained. There are 5 non-rib-bearing lumbar type vertebral bodies. There is no fracture or spondylolisthesis. There is mild disc space narrowing at L4-5. The disc spaces appear unremarkable. No erosive change. There is aortic atherosclerosis. Calcification in the pelvis is felt to  represent calcified uterine leiomyomas. IMPRESSION: No fracture or spondylolisthesis. Disc space narrowing L4-5. Other disc spaces appear unremarkable. Calcified uterine leiomyomas.   Aortic atherosclerosis. Electronically Signed   By: Lowella Grip III M.D.   On: 02/12/2017 12:07   Mr Brain Wo Contrast  Result Date: 02/12/2017 CLINICAL DATA:  Right lower extremity weakness. History of metastatic breast cancer. Follow-up. EXAM: MRI HEAD WITHOUT CONTRAST TECHNIQUE: Multiplanar, multiecho pulse sequences of the brain and surrounding structures were obtained without intravenous contrast. COMPARISON:  11/10/2016 FINDINGS: Brain: Today's study was ordered and performed without contrast. Previously, the patient had an 18 x 19 mm left temporal metastasis with pronounced vasogenic edema. On today's study, the edema is completely resolved. There is a focus of T2 and FLAIR signal remaining in the left temporal lobe measuring 8 x 10 mm in diameter which could represent post treatment gliosis. Cannot completely rule out some residual viable tumor in this location, but this may simply be post treatment gliosis. No new or second lesion is identified on this noncontrast study. The brain is otherwise normal. No hydrocephalus or extra-axial collection. No midline shift. Vascular: Major vessels at the base of the brain show flow. Skull and upper cervical spine: Negative Sinuses/Orbits: Clear/normal Other: None IMPRESSION: Positive response to treatment when compared to the study of February. Today's study is done without contrast. There is a residual 8 x 10 mm focus of T2 and FLAIR signal in the left temporal lobe that may simply be post treatment gliosis. There is no residual edema. Cannot completely rule out residual viable tumor, and further follow-up is warranted. No new lesion is seen. Electronically Signed   By: Nelson Chimes M.D.   On: 02/12/2017 10:38     ASSESSMENT AND PLAN:   61 year old female with history of metastatic breast cancer undergoing chemotherapy, recent pulmonary emboli on anticoagulation, past history of GI bleed who presents with melena and right foot weakness.  1. GI  bleed:Patient underwent colonoscopy which showed few polyps in the sigmoid colon that was treated with argon beam coagulation. These polyps were oozing blood. Hemoglobin has remained stable GI consult appreciated  2. Right foot drop/weakness: MRI shows no evidence of CVA and x-ray shows improvement in brain metastatic disease without cerebral edema  Concerns for foot drop/neuropathy from- [place] PT consult pending 3. Recent pulmonary emboli on anticoagulation which is being held due to problem #1  Patient will need IVC filter due to GI bleed. Vascular surgery consulted again.  4. Recurrent metastatic breast cancer: Oncology evaluation appreciated  5. Hypokalemia: This has been repleted   6. Elevated troponin due to demand ischemia. Patient has ruled out for non-ST elevation MI  7. Acute on chronic anemia from GI bleed Continue to monitor CBC No indication for blood transfusion today.    Management plans discussed with the patient and she is in agreement.  CODE STATUS: full;  TOTAL TIME TAKING CARE OF THIS PATIENT: 27 minutes.     POSSIBLE D/C 1-2 days, DEPENDING ON CLINICAL CONDITION.   Sherilyn Windhorst M.D on 02/14/2017 at 7:41 AM  Between 7am to 6pm - Pager - [phone] After 6pm go to [email or web address] - password [email or web address] [facility]  Office  [phone]  CC: Primary care physician; [name], MD  Note: This dictation was prepared with Dragon dictation along with smaller phrase technology. Any transcriptional errors that result from this process are unintentional.

## 2017-02-15 ENCOUNTER — Telehealth: Payer: Self-pay | Admitting: Internal Medicine

## 2017-02-15 ENCOUNTER — Encounter
Admission: EM | Disposition: A | Discharge status: Home Health | Payer: Self-pay | Source: Home / Self Care | Attending: Internal Medicine

## 2017-02-15 ENCOUNTER — Other Ambulatory Visit: Payer: Self-pay | Admitting: Internal Medicine

## 2017-02-15 DIAGNOSIS — Z170 Estrogen receptor positive status [ER+]: Principal | ICD-10-CM

## 2017-02-15 DIAGNOSIS — I2699 Other pulmonary embolism without acute cor pulmonale: Secondary | ICD-10-CM

## 2017-02-15 DIAGNOSIS — B379 Candidiasis, unspecified: Secondary | ICD-10-CM

## 2017-02-15 DIAGNOSIS — C50412 Malignant neoplasm of upper-outer quadrant of left female breast: Secondary | ICD-10-CM

## 2017-02-15 HISTORY — PX: IVC FILTER INSERTION: CATH118245

## 2017-02-15 LAB — BASIC METABOLIC PANEL
ANION GAP: 5 (ref 5–15)
BUN: 8 mg/dL (ref 6–20)
CALCIUM: 7.6 mg/dL — AB (ref 8.9–10.3)
CO2: 26 mmol/L (ref 22–32)
Chloride: 100 mmol/L — ABNORMAL LOW (ref 101–111)
Creatinine, Ser: 0.71 mg/dL (ref 0.44–1.00)
GFR calc Af Amer: >60 mL/min (ref >60)
GLUCOSE: 89 mg/dL (ref 65–99)
Potassium: 3.4 mmol/L — ABNORMAL LOW (ref 3.5–5.1)
SODIUM: 131 mmol/L — AB (ref 135–145)

## 2017-02-15 LAB — CBC
HCT: 25.1 % — ABNORMAL LOW (ref 35.0–47.0)
Hemoglobin: 8.4 g/dL — ABNORMAL LOW (ref 12.0–16.0)
MCH: 29.7 pg (ref 26.0–34.0)
MCHC: 33.4 g/dL (ref 32.0–36.0)
MCV: 88.9 fL (ref 80.0–100.0)
PLATELETS: 76 10*3/uL — AB (ref 150–440)
RBC: 2.83 MIL/uL — AB (ref 3.80–5.20)
RDW: 23.4 % — ABNORMAL HIGH (ref 11.5–14.5)
WBC: 6 10*3/uL (ref 3.6–11.0)

## 2017-02-15 LAB — MAGNESIUM: Magnesium: 1.7 mg/dL (ref 1.7–2.4)

## 2017-02-15 SURGERY — IVC FILTER INSERTION
Anesthesia: Moderate Sedation

## 2017-02-15 MED ORDER — HEPARIN SOD (PORK) LOCK FLUSH 100 UNIT/ML IV SOLN
500.0000 [IU] | Freq: Once | INTRAVENOUS | Status: AC
  Administered 2017-02-15: 500 [IU] via INTRAVENOUS
  Filled 2017-02-15: qty 5

## 2017-02-15 MED ORDER — LIDOCAINE-EPINEPHRINE (PF) 2 %-1:200000 IJ SOLN
INTRAMUSCULAR | Status: AC
  Filled 2017-02-15: qty 20

## 2017-02-15 MED ORDER — NYSTATIN 100000 UNIT/ML MT SUSP: 5.0000 mL | Freq: Four times a day (QID) | OROMUCOSAL | Status: DC

## 2017-02-15 MED ORDER — CEFAZOLIN SODIUM-DEXTROSE 1-4 GM/50ML-% IV SOLN
1.0000 g | Freq: Once | INTRAVENOUS | Status: AC
  Administered 2017-02-15: 1 g via INTRAVENOUS

## 2017-02-15 MED ORDER — MIDAZOLAM HCL 2 MG/2ML IJ SOLN
INTRAMUSCULAR | Status: DC | PRN
  Administered 2017-02-15: 2 mg via INTRAVENOUS

## 2017-02-15 MED ORDER — SODIUM CHLORIDE 0.9 % IV SOLN
INTRAVENOUS | Status: DC
  Administered 2017-02-15: 15:00:00 via INTRAVENOUS

## 2017-02-15 MED ORDER — CEFAZOLIN SODIUM-DEXTROSE 1-4 GM/50ML-% IV SOLN
INTRAVENOUS | Status: AC
Start: 2017-02-15 — End: 2017-02-15
  Filled 2017-02-15: qty 50

## 2017-02-15 MED ORDER — FENTANYL CITRATE (PF) 100 MCG/2ML IJ SOLN
INTRAMUSCULAR | Status: DC | PRN
  Administered 2017-02-15: 50 ug via INTRAVENOUS

## 2017-02-15 MED ORDER — MIDAZOLAM HCL 5 MG/5ML IJ SOLN
INTRAMUSCULAR | Status: AC
  Filled 2017-02-15: qty 5

## 2017-02-15 MED ORDER — HEPARIN SOD (PORK) LOCK FLUSH 10 UNIT/ML IV SOLN
10.0000 [IU] | Freq: Once | INTRAVENOUS | Status: DC
  Filled 2017-02-15: qty 1

## 2017-02-15 MED ORDER — FENTANYL CITRATE (PF) 100 MCG/2ML IJ SOLN
INTRAMUSCULAR | Status: AC
  Filled 2017-02-15: qty 2

## 2017-02-15 MED ORDER — IOPAMIDOL (ISOVUE-300) INJECTION 61%
INTRAVENOUS | Status: DC | PRN
  Administered 2017-02-15: 15 mL via INTRAVENOUS

## 2017-02-15 SURGICAL SUPPLY — 4 items
FILTER VC CELECT-FEMORAL (Filter) ×2 IMPLANT
PACK ANGIOGRAPHY (CUSTOM PROCEDURE TRAY) ×2 IMPLANT
TOWEL OR 17X26 4PK STRL BLUE (TOWEL DISPOSABLE) ×2 IMPLANT
WIRE J 3MM .035X145CM (WIRE) ×2 IMPLANT

## 2017-02-15 NOTE — Progress Notes (Signed)
Sarah Horne   DOB:1956/07/02   EB#:[id number]    Subjective: Patient denies any blood in stools or black stools. She is currently nothing by mouth awaiting for her IVC filter placement. Denies any pain. Continues to have chronic mild weakness in the lower extremities on the right side. Denies any headaches.  Objective:  Vitals:   02/15/17 1728 02/15/17 1747  BP: 110/66 109/63  Pulse: (!) 108 (!) 110  Resp: 17 18  Temp:  97.8 F (36.6 C)     Intake/Output Summary (Last 24 hours) at 02/15/17 1754 Last data filed at 02/15/17 1457  Gross per 24 hour  Intake               20 ml  Output              100 ml  Net              -80 ml    GENERAL: Well-nourished well-developed; Alert, no distress and comfortable.   Alone. EYES:positive for pallor no icterus OROPHARYNX: Positive for thrush.  NECK: supple, no masses felt LYMPH:  no palpable lymphadenopathy in the cervical, axillary or inguinal regions LUNGS: decreased breath sounds to auscultation at bases and  No wheeze or crackles HEART/CVS: regular rate & rhythm and no murmurs; No lower extremity edema ABDOMEN: abdomen soft, non-tender and normal bowel sounds Musculoskeletal:no cyanosis of digits and no clubbing  PSYCH: alert & oriented x 3 with fluent speech NEURO: weakness noted in the right lower extremity/foot drop SKIN:  no rashes or significant lesions   Labs:  Lab Results  Component Value Date   WBC 6.0 02/15/2017   HGB 8.4 (L) 02/15/2017   HCT 25.1 (L) 02/15/2017   MCV 88.9 02/15/2017   PLT 76 (L) 02/15/2017   NEUTROABS 0.5 (L) 02/04/2017    Lab Results  Component Value Date   NA 131 (L) 02/15/2017   K 3.4 (L) 02/15/2017   CL 100 (L) 02/15/2017   CO2 26 02/15/2017    Studies:  No results found.  Assessment & Plan:   #  61 year old  Female patient with metastatic breast cancer currently admitted to the hospital for GI bleed/anemia.   # Metastatic breast cancer ER/PR positive HER-2/neu negative- currently  on Eribulin cycle #2 day 8 [with neulasta] on 5/10th. Patient had a good response of her breast cancer to chemotherapy so far; however multiple admission the hospital. Concerns for foot drop/neuropathy from- Eribulin. We'll plan to discontinue chemotherapy. Recommend treatment with Faslodex + Abemaciclib.   # right foot drop/lower extremity weakness- Given the good response in the brain from the radiation; do not suspect progressive CNS disease cause of patient's weakness. Possible toxicity from Eribulin. Appreciate neurology recommendations. Plan as above. Awaiting PT evaluation.   # acute GI bleed- on Xarelto 10 mg; currently discontinued. Status post colonoscopy. Appreciate GI evaluation.  # Thrush- recommend nystatin.   # history of incidental PE- currently off anticoagulation. Awaiting IVC filter this morning.  # Patient could be discharged after the procedure from hem-onc standpoint; Discussed with Dr.Mody.   Cammie Sickle, MD 02/15/2017  5:54 PM

## 2017-02-15 NOTE — Progress Notes (Signed)
Pharmacy note  Electrolyte replacement  5/21: K= 3.4; all other labs within normal limits. Will continue current supplementation of Potassium. Will follow.   Larene Beach, PharmD  02/15/17 8:47 AM

## 2017-02-15 NOTE — Discharge Summary (Signed)
[facility] NAME: Sarah Horne    MR#:  [id number]  DATE OF BIRTH:  02-27-1956  DATE OF ADMISSION:  02/11/2017 ADMITTING PHYSICIAN: Lance Coon, MD  DATE OF DISCHARGE: 02/14/2017  PRIMARY CARE PHYSICIAN: [name], MD    ADMISSION DIAGNOSIS:  Lower GI bleed [K92.2]  DISCHARGE DIAGNOSIS:  Principal Problem:   GI bleed Active Problems:   Hypertension   HLD (hyperlipidemia)   Pulmonary emboli (HCC)   Metastatic breast cancer (HCC)   Weakness of right lower extremity   Lower GI bleed   Blood in stool   Polyp of sigmoid colon   Benign neoplasm of transverse colon   Benign neoplasm of ascending colon   SECONDARY DIAGNOSIS:   Past Medical History:  Diagnosis Date  . Chemotherapy-induced peripheral neuropathy ([place])   . Epistaxis    a. 11/2016 in setting of asa/plavix-->silver nitrate cauterization.  . GI bleed    a. 11/2016 Admission w/ GIB and hypovolemic shock req 3u PRBC's;  b. 11/2016 ECG: gastritis & nonbleeding peptic ulcer; c. 11/2016 Conlonoscopy: rectal and sigmoid colonic ulcers.  . Heart attack ([place])    a. 1998 Cath @ [facility]: reportedly no intervention required.  Marland Kitchen Herceptin-induced cardiomyopathy ([place])    a. In the setting of Herceptin Rx for breast cancer (initiated 12/2014); b. 03/2015 MUGA EF 64%; b. 08/2015 MUGA: EF 51%; c. 10/2015 MUGA: EF 44%; d. 11/2015 Echo: EF 45-50%; e. 01/2016 MUGA: EF 60%; f. 06/2016 MUGA EF 65%; g. 10/2016 MUGA: EF 61%;  h. 12/2016 Echo: EF 55-60%, gr1 DD.  Marland Kitchen Hyperlipidemia   . Hypertension   . Neuropathy   . Possible PAD (peripheral artery disease) ([place])    a. 11/2016 LE cyanosis and weak pulses-->CTA w/o significant Ao-BiFem dzs. ? distal dzs-->ASA/Plavix initiated by vascular surgery.  Marland Kitchen PSVT (paroxysmal supraventricular tachycardia) ([place])    a. Dx 11/2016.  . Pulmonary embolism ([place])    a. 12/2016 CTA Chest: small nonocclusive PE in inferior segment of the Left lingula, somewhat  eccentric filling defect suggesting chronic rather than acute embolic event; b. 11/7626 LE U/S:  No DVT; c. 12/2016 Echo: Nl RV fxn, nl PASP.  Marland Kitchen Recurrent Metastatic breast cancer ([place])    a. Dx 2016: Stage II, ER positive, PR positive, HER-2/neu overexpressing of the left breast-->chemo/radiation; b. 10/2016 CT Abd/pelvis: diffuse liver mets, ill defined sclerotic bone lesions-T12;  c. 10/2016 MRI brain: metastatic lesion along L temporal lobe (19x10m) w/ extensive surrounding edema & 513mmidline shift to right.  . Sinus tachycardia     HOSPITAL COURSE:   61ear old female with history of metastatic breast cancer undergoing chemotherapy, recent pulmonary emboli on anticoagulation, past history of GI bleed who presents with melena and right foot weakness.  1. GI bleed:Patient underwent colonoscopy which showed few polyps in the sigmoid colon that was treated with argon beam coagulation. These polyps were oozing blood. Hemoglobin has remained stable She will have outpatient follow-up with GI.  2. Right foot drop/weakness: MRI shows no evidence of CVA and x-ray shows improvement in brain metastatic disease without cerebral edema Concerns for foot drop/neuropathy from- Eribulin  3. Recent pulmonary emboli on anticoagulation: Due to GI bleed and above results and recommendations are to discontinue anticoagulation and aspirin for now. IVC filter was recommended by vascular surgery and placed on May 21.  4. Recurrent metastatic breast cancer: Oncology evaluation appreciated. She will follow up with oncology in 1 week  5. Hypokalemia:  This has been repleted  She will continue with potassium supplementation 6. Elevated troponin due to demand ischemia. Patient has ruled out for non-ST elevation MI  7. Acute on chronic anemia from GI bleed She did not require blood transfusions while in the hospital.  DISCHARGE CONDITIONS AND DIET:   Stable for discharge on regular diet  CONSULTS  OBTAINED:  Treatment Team:  Lucilla Lame, MD Cammie Sickle, MD Schnier, Dolores Lory, MD Alexis Goodell, MD  DRUG ALLERGIES:   Allergies  Allergen Reactions  . No Known Allergies     DISCHARGE MEDICATIONS:   Current Discharge Medication List    CONTINUE these medications which have NOT CHANGED   Details  anastrozole (ARIMIDEX) 1 MG tablet TAKE 1 TABLET DAILY Qty: 90 tablet, Refills: 3   Associated Diagnoses: Malignant neoplasm of lower-inner quadrant of left female breast ([place]); Osteoporosis; Malignant neoplasm of left female breast (HCC)    diphenoxylate-atropine (LOMOTIL) 2.5-0.025 MG tablet Take 1 tablet by mouth 4 (four) times daily as needed for diarrhea or loose stools. Take it along with immodium Qty: 60 tablet, Refills: 0   Associated Diagnoses: Carcinoma of upper-outer quadrant of left breast in female, estrogen receptor positive (HCC)    DULoxetine (CYMBALTA) 60 MG capsule TAKE 1 CAPSULE (60 MG TOTAL) BY MOUTH DAILY. Qty: 30 capsule, Refills: 0    flecainide (TAMBOCOR) 50 MG tablet Take 1 tablet (50 mg total) by mouth 2 (two) times daily. Qty: 60 tablet, Refills: 1    KLOR-CON M20 20 MEQ tablet Take 20 mEq by mouth 2 (two) times daily. Refills: 3    metoprolol tartrate (LOPRESSOR) 25 MG tablet Take 0.5 tablets (12.5 mg total) by mouth 2 (two) times daily. Hold if you're Systolic Blood pressure is less than 100. Qty: 90 tablet, Refills: 3    ondansetron (ZOFRAN) 8 MG tablet Take 1 tablet (8 mg total) by mouth every 8 (eight) hours as needed for nausea or vomiting (start 3 days; after chemo). Qty: 40 tablet, Refills: 1    pantoprazole (PROTONIX) 40 MG tablet Take 1 tablet (40 mg total) by mouth 2 (two) times daily. Qty: 60 tablet, Refills: 2    polyethylene glycol (MIRALAX / GLYCOLAX) packet Take 17 g by mouth daily. Qty: 14 each, Refills: 0    feeding supplement, ENSURE ENLIVE, (ENSURE ENLIVE) LIQD Take 237 mLs by mouth 2 (two) times daily between  meals. Qty: 237 mL, Refills: 12      STOP taking these medications     aspirin 81 MG tablet      rivaroxaban (XARELTO) 10 MG TABS tablet           Today   CHIEF COMPLAINT:  Condition doing well this morning. No shortness of breath or chest pain   VITAL SIGNS:  Blood pressure 109/63, pulse (!) 110, temperature 97.8 F (36.6 C), temperature source Oral, resp. rate 18, height '5\' 6"'$  (1.676 m), weight 64.9 kg (143 lb), SpO2 96 %.   REVIEW OF SYSTEMS:  Review of Systems  Constitutional: Negative.  Negative for chills, fever and malaise/fatigue.  HENT: Negative.  Negative for ear discharge, ear pain, hearing loss, nosebleeds and sore throat.   Eyes: Negative.  Negative for blurred vision and pain.  Respiratory: Negative.  Negative for cough, hemoptysis, shortness of breath and wheezing.   Cardiovascular: Negative.  Negative for chest pain, palpitations and leg swelling.  Gastrointestinal: Negative.  Negative for abdominal pain, blood in stool, diarrhea, nausea and vomiting.  Genitourinary: Negative.  Negative  for dysuria.  Musculoskeletal: Negative.  Negative for back pain.  Skin: Negative.   Neurological: Negative for dizziness, tremors, speech change, focal weakness, seizures and headaches.  Endo/Heme/Allergies: Negative.  Does not bruise/bleed easily.  Psychiatric/Behavioral: Negative.  Negative for depression, hallucinations and suicidal ideas.     PHYSICAL EXAMINATION:  GENERAL:  61 y.o.-year-old patient lying in the bed with no acute distress.  NECK:  Supple, no jugular venous distention. No thyroid enlargement, no tenderness.  LUNGS: Normal breath sounds bilaterally, no wheezing, rales,rhonchi  No use of accessory muscles of respiration.  CARDIOVASCULAR: S1, S2 normal. No murmurs, rubs, or gallops.  ABDOMEN: Soft, non-tender, non-distended. Bowel sounds present. No organomegaly or mass.  EXTREMITIES: No pedal edema, cyanosis, or clubbing.  PSYCHIATRIC: The patient  is alert and oriented x 3.  SKIN: No obvious rash, lesion, or ulcer.   DATA REVIEW:   CBC  Recent Labs Lab 02/15/17 0437  WBC 6.0  HGB 8.4*  HCT 25.1*  PLT 76*    Chemistries   Recent Labs Lab 02/11/17 2018  02/15/17 0437  NA 132*  < > 131*  K 3.6  < > 3.4*  CL 96*  < > 100*  CO2 22  < > 26  GLUCOSE 91  < > 89  BUN 5*  < > 8  CREATININE 0.76  < > 0.71  CALCIUM 8.4*  < > 7.6*  MG  --   < > 1.7  AST 142*  --   --   ALT 44  --   --   ALKPHOS 319*  --   --   BILITOT 2.7*  --   --   < > = values in this interval not displayed.  Cardiac Enzymes  Recent Labs Lab 02/11/17 2018 02/12/17 0219 02/12/17 0743  TROPONINI 0.06* 0.03* 0.03*    Microbiology Results  '@MICRORSLT48'$ @  RADIOLOGY:  No results found.    Current Discharge Medication List    CONTINUE these medications which have NOT CHANGED   Details  anastrozole (ARIMIDEX) 1 MG tablet TAKE 1 TABLET DAILY Qty: 90 tablet, Refills: 3   Associated Diagnoses: Malignant neoplasm of lower-inner quadrant of left female breast ([place]); Osteoporosis; Malignant neoplasm of left female breast (HCC)    diphenoxylate-atropine (LOMOTIL) 2.5-0.025 MG tablet Take 1 tablet by mouth 4 (four) times daily as needed for diarrhea or loose stools. Take it along with immodium Qty: 60 tablet, Refills: 0   Associated Diagnoses: Carcinoma of upper-outer quadrant of left breast in female, estrogen receptor positive (HCC)    DULoxetine (CYMBALTA) 60 MG capsule TAKE 1 CAPSULE (60 MG TOTAL) BY MOUTH DAILY. Qty: 30 capsule, Refills: 0    flecainide (TAMBOCOR) 50 MG tablet Take 1 tablet (50 mg total) by mouth 2 (two) times daily. Qty: 60 tablet, Refills: 1    KLOR-CON M20 20 MEQ tablet Take 20 mEq by mouth 2 (two) times daily. Refills: 3    metoprolol tartrate (LOPRESSOR) 25 MG tablet Take 0.5 tablets (12.5 mg total) by mouth 2 (two) times daily. Hold if you're Systolic Blood pressure is less than 100. Qty: 90 tablet, Refills: 3     ondansetron (ZOFRAN) 8 MG tablet Take 1 tablet (8 mg total) by mouth every 8 (eight) hours as needed for nausea or vomiting (start 3 days; after chemo). Qty: 40 tablet, Refills: 1    pantoprazole (PROTONIX) 40 MG tablet Take 1 tablet (40 mg total) by mouth 2 (two) times daily. Qty: 60 tablet, Refills: 2  polyethylene glycol (MIRALAX / GLYCOLAX) packet Take 17 g by mouth daily. Qty: 14 each, Refills: 0    feeding supplement, ENSURE ENLIVE, (ENSURE ENLIVE) LIQD Take 237 mLs by mouth 2 (two) times daily between meals. Qty: 237 mL, Refills: 12      STOP taking these medications     aspirin 81 MG tablet      rivaroxaban (XARELTO) 10 MG TABS tablet            Management plans discussed with the patient and she is in agreement. Stable for discharge Patient should follow up with pcp  CODE STATUS:     Code Status Orders        Start     Ordered   02/11/17 2221  Full code  Continuous     02/11/17 2221    Code Status History    Date Active Date Inactive Code Status Order ID Comments User Context   01/12/2017  6:23 PM 01/16/2017  4:57 PM Full Code [id number]  Hillary Bow, MD ED   12/04/2016  8:31 PM 12/11/2016  3:30 PM Full Code [id number]  Fritzi Mandes, MD Inpatient   11/10/2016  8:59 AM 11/12/2016  4:00 PM Full Code [id number]  Hillary Bow, MD ED    Advance Directive Documentation     Most Recent Value  Type of Advance Directive  Healthcare Power of [place], Living will  Pre-existing out of facility DNR order (yellow form or pink MOST form)  -  "MOST" Form in Place?  -      TOTAL TIME TAKING CARE OF THIS PATIENT: 37 minutes.    Note: This dictation was prepared with Dragon dictation along with smaller phrase technology. Any transcriptional errors that result from this process are unintentional.  Brock Mokry M.D on 02/15/2017 at 5:47 PM  Between 7am to 6pm - Pager - [phone] After 6pm go to [email or web address] - password [email or web address] [facility]  Office   [phone]  CC: Primary care physician; [name], MD

## 2017-02-15 NOTE — Telephone Encounter (Signed)
I changed the patient's chemotherapy plan. Patient will need to start on Faslodex starting 5/24 instead of eribulin; orders are in.

## 2017-02-15 NOTE — Op Note (Signed)
[facility]   OPERATIVE NOTE    PRE-OPERATIVE DIAGNOSIS: PE, GI bleed  POST-OPERATIVE DIAGNOSIS: same as above  PROCEDURE: 1.   Ultrasound guidance for vascular access to the right femoral vein 2.   Catheter placement into the inferior vena cava 3.   Inferior venacavogram 4.   Placement of a Cook Celect IVC filter  SURGEON: Leotis Pain, MD  ASSISTANT(S): None  ANESTHESIA: local with Moderate Conscious Sedation for approximately 15 minutes using 2 mg of Versed and 50 mcg of Fentanyl  ESTIMATED BLOOD LOSS: minimal  CONTRAST: 15 cc  FLUORO TIME: less than one minute  FINDING(S): 1.  Patent IVC  SPECIMEN(S):  none  INDICATIONS:   Sarah Horne is a 61 y.o. female who presents with PE and a GI bleed with a contraindication to anticoagulation.  Inferior vena cava filter is indicated for this reason.  Risks and benefits including filter thrombosis, migration, fracture, bleeding, and infection were all discussed.  We discussed that all IVC filters that we place can be removed if desired from the patient once the need for the filter has passed.    DESCRIPTION: After obtaining full informed written consent, the patient was brought back to the vascular suite. The skin was sterilely prepped and draped in a sterile surgical field was created. Moderate conscious sedation was administered during a face to face encounter with the patient throughout the procedure with my supervision of the RN administering medicines and monitoring the patient's vital signs, pulse oximetry, telemetry and mental status throughout from the start of the procedure until the patient was taken to the recovery room. The right femoral vein was accessed under direct ultrasound guidance without difficulty with a Seldinger needle and a J-wire was then placed. After skin nick and dilatation, the delivery sheath was placed into the inferior vena cava and an inferior venacavogram was performed. This  demonstrated a patent IVC with the level of the renal veins at L1-L2 interspace.  The filter was then deployed into the inferior vena cava at the level of the top of L2 just below the renal veins. The delivery sheath was then removed. Pressure was held. Sterile dressings were placed. The patient tolerated the procedure well and was taken to the recovery room in stable condition.  COMPLICATIONS: None  CONDITION: Stable  Leotis Pain  02/15/2017, 4:38 PM   This note was created with Dragon Medical transcription system. Any errors in dictation are purely unintentional.

## 2017-02-15 NOTE — Care Management (Signed)
Discharge to home today per Dr. Benjie Karvonen. Physical therapy evaluation completed. Recommending home with home health and physical therapy. Followed by [facility] in the home at present. Floydene Flock, [facility] representative updated. Family/friend will transport. Shelbie Ammons RN MSN CCM Care Management [phone]

## 2017-02-15 NOTE — Progress Notes (Signed)
Patient here today for ivc filter placement per Dr Lucky Cowboy. Placed on monitor to monitor vitals throughout entire procedure.

## 2017-02-15 NOTE — Plan of Care (Signed)
Problem: Safety: Goal: Ability to remain free from injury will improve Outcome: Progressing Pt up to bathroom with one assist with walker. Bed alarm activated.   Problem: Pain Managment: Goal: General experience of comfort will improve Outcome: Progressing No complaints of pain or discomfort   Problem: Activity: Goal: Risk for activity intolerance will decrease Outcome: Progressing Pt up to bathroom instead of BSC  Problem: Nutrition: Goal: Adequate nutrition will be maintained Outcome: Not Progressing Pt only eating minimal amounts of food   Problem: Bowel/Gastric: Goal: Will show no signs and symptoms of gastrointestinal bleeding Outcome: Progressing No signs/symptoms of bleeding this shift

## 2017-02-15 NOTE — Progress Notes (Signed)
Physical Therapy Treatment Patient Details Name: Sarah Horne MRN: [id number] DOB: 05-Apr-1956 Today's Date: 02/15/2017    History of Present Illness Pt admitted for GI bleed. History includes metastatic breast cancer with mets to brain currently on radiation and chemo. Per previous chart reivew, may have other mets to bone. History of PE. Pt currently complains of R side weakness and foot drop. Colonoscopy on 02/13/17 and is scheduled for IVC filter this date.    PT Comments    Pt is making good progress towards goals with increased ambulation distance performed this date with RW use. Pt able to participate in there-ex and educated on stretches for R foot to maintain PROM in order to AFO fitting. Cued to continue use of RW for balance/stabiltiy at this time. Will continue to progress.   Follow Up Recommendations  Home health PT;Supervision for mobility/OOB     Equipment Recommendations  None recommended by PT    Recommendations for Other Services       Precautions / Restrictions Precautions Precautions: Fall Restrictions Weight Bearing Restrictions: No    Mobility  Bed Mobility Overal bed mobility: Independent             General bed mobility comments: safe technique performed without issue  Transfers Overall transfer level: Modified independent Equipment used: Rolling walker (2 wheeled)             General transfer comment: Safe technique with RW used for assistance  Ambulation/Gait Ambulation/Gait assistance: Min guard Ambulation Distance (Feet): 200 Feet Assistive device: Rolling walker (2 wheeled) Gait Pattern/deviations: Step-to pattern     General Gait Details: Pt ambulated in hallway with increased R hip/knee flexion secondary to foot drop. Pt fatigues with increased distance, needing 1 standing rest break. Cues to keep RW close to body secondary to balance deficits.   Stairs            Wheelchair Mobility    Modified Rankin (Stroke  Patients Only)       Balance                                            Cognition Arousal/Alertness: Awake/alert Behavior During Therapy: WFL for tasks assessed/performed Overall Cognitive Status: Within Functional Limits for tasks assessed                                        Exercises Other Exercises Other Exercises: Supine ther-ex performed including R dorsiflexion stretches using blanket. 2 x 30 second hold, educated to continue on own. Pt also performed B resisted heel slides, SLRs, and hip abd/add. All ther-ex performed x 12 reps with cga.    General Comments        Pertinent Vitals/Pain Pain Assessment: No/denies pain    Home Living                      Prior Function            PT Goals (current goals can now be found in the care plan section) Acute Rehab PT Goals Patient Stated Goal: to feel better PT Goal Formulation: With patient Time For Goal Achievement: 02/26/17 Potential to Achieve Goals: Good Progress towards PT goals: Progressing toward goals    Frequency    Min 2X/week  PT Plan Current plan remains appropriate    Co-evaluation              AM-PAC PT "6 Clicks" Daily Activity  Outcome Measure  Difficulty turning over in bed (including adjusting bedclothes, sheets and blankets)?: None Difficulty moving from lying on back to sitting on the side of the bed? : None Difficulty sitting down on and standing up from a chair with arms (e.g., wheelchair, bedside commode, etc,.)?: None Help needed moving to and from a bed to chair (including a wheelchair)?: None Help needed walking in hospital room?: None Help needed climbing 3-5 steps with a railing? : A Little 6 Click Score: 23    End of Session Equipment Utilized During Treatment: Gait belt Activity Tolerance: Patient tolerated treatment well Patient left: in chair;with chair alarm set Nurse Communication: Mobility status PT Visit  Diagnosis: Muscle weakness (generalized) (M62.81);Difficulty in walking, not elsewhere classified (R26.2)     Time: 1914-7829 PT Time Calculation (min) (ACUTE ONLY): 25 min  Charges:  $Gait Training: 8-22 mins $Therapeutic Exercise: 8-22 mins                    G Codes:       Greggory Stallion, PT, DPT [phone]    Sarah Horne 02/15/2017, 10:20 AM

## 2017-02-15 NOTE — Progress Notes (Signed)
Patient is stable and ready for discharge. VSS. Right femoral site clean/dry/intact from IVF filter placement this afternoon. Discharge packet reviewed with patient- emphasizing not to take blood thinners anymore- patient verbalized understanding. Port de-accessed per protocol. Patient's family to transport home.

## 2017-02-15 NOTE — H&P (Signed)
 [facility] History & Physical Update  The patient was interviewed and re-examined.  The patient's previous History and Physical has been reviewed and is unchanged.  There is no change in the plan of care. We plan to proceed with the scheduled procedure.  Leotis Pain, MD  02/15/2017, 4:28 PM

## 2017-02-15 NOTE — Progress Notes (Signed)
[facility] NAME: Sarah Horne    MR#:  [id number]  DATE OF BIRTH:  05-19-1956  SUBJECTIVE:   Plan for IVC filter today REVIEW OF SYSTEMS:    Review of Systems  Constitutional: Negative for fever, chills weight loss HENT: Negative for ear pain, nosebleeds, congestion, facial swelling, rhinorrhea, neck pain, neck stiffness and ear discharge.   Respiratory: Negative for cough, shortness of breath, wheezing  Cardiovascular: Negative for chest pain, palpitations and leg swelling.  Gastrointestinal: Negative for heartburn, abdominal pain, vomiting, diarrhea or consitpation Bloody stools as above Genitourinary: Negative for dysuria, urgency, frequency, hematuria Musculoskeletal: Negative for back pain or joint pain Neurological: Negative for dizziness, seizures, syncope,   numbness and headaches.   Right leg weakness Improving Hematological: Does not bruise/bleed easily.  Psychiatric/Behavioral: Negative for hallucinations, confusion, dysphoric mood    Tolerating Diet: Nothing by mouth    DRUG ALLERGIES:   Allergies  Allergen Reactions  . No Known Allergies     VITALS:  Blood pressure (!) 113/58, pulse 86, temperature 98.2 F (36.8 C), temperature source Oral, resp. rate 18, height '5\' 6"'$  (1.676 m), weight 65 kg (143 lb 3.2 oz), SpO2 99 %.  PHYSICAL EXAMINATION:  Constitutional: Appears well-developed and well-nourished. No distress. HENT: Normocephalic. Marland Kitchen Oropharynx is clear and moist.  Eyes: Conjunctivae and EOM are normal. PERRLA, no scleral icterus.  Neck: Normal ROM. Neck supple. No JVD. No tracheal deviation. CVS: RRR, S1/S2 +, no murmurs, no gallops, no carotid bruit.  Pulmonary: Effort and breath sounds normal, no stridor, rhonchi, wheezes, rales.  Abdominal: Soft. BS +,  no distension, tenderness, rebound or guarding.  Musculoskeletal: Normal range of motion. No edema and no tenderness.  Neuro: Alert. CN 2-12  grossly intact. No focal deficits.Strength is 5 out of 5 bilaterally and symmetrically Skin: Skin is warm and dry. No rash noted. Psychiatric: Normal mood and affect.      LABORATORY PANEL:   CBC  Recent Labs Lab 02/15/17 0437  WBC 6.0  HGB 8.4*  HCT 25.1*  PLT 76*   ------------------------------------------------------------------------------------------------------------------  Chemistries   Recent Labs Lab 02/11/17 2018  02/15/17 0437  NA 132*  < > 131*  K 3.6  < > 3.4*  CL 96*  < > 100*  CO2 22  < > 26  GLUCOSE 91  < > 89  BUN 5*  < > 8  CREATININE 0.76  < > 0.71  CALCIUM 8.4*  < > 7.6*  MG  --   < > 1.7  AST 142*  --   --   ALT 44  --   --   ALKPHOS 319*  --   --   BILITOT 2.7*  --   --   < > = values in this interval not displayed. ------------------------------------------------------------------------------------------------------------------  Cardiac Enzymes  Recent Labs Lab 02/11/17 2018 02/12/17 0219 02/12/17 0743  TROPONINI 0.06* 0.03* 0.03*   ------------------------------------------------------------------------------------------------------------------  RADIOLOGY:  No results found.   ASSESSMENT AND PLAN:   61 year old female with history of metastatic breast cancer undergoing chemotherapy, recent pulmonary emboli on anticoagulation, past history of GI bleed who presents with melena and right foot weakness.  1. GI bleed:Patient underwent colonoscopy which showed few polyps in the sigmoid colon that was treated with argon beam coagulation. These polyps were oozing blood. Hemoglobin has remained stable She will have outpatient follow-up with GI.  2. Right foot drop/weakness: MRI shows no evidence of CVA and x-ray shows improvement  in brain metastatic disease without cerebral edema Concerns for foot drop/neuropathy from- Eribulin  3. Recent pulmonary emboli on anticoagulation: Due to GI bleed and above results and recommendations  are to discontinue anticoagulation and aspirin for now. IVC filter was recommended by vascular surgery and placed on May 21.  4. Recurrent metastatic breast cancer: Oncology evaluation appreciated. She will follow up with oncology in 1 week  5. Hypokalemia: This has been repleted  She will continue with potassium supplementation 6. Elevated troponin due to demand ischemia. Patient has ruled out for non-ST elevation MI  7. Acute on chronic anemia from GI bleed She did not require blood transfusions while in the hospital  Management plans discussed with the patient and she is in agreement.  CODE STATUS: full;  TOTAL TIME TAKING CARE OF THIS PATIENT: 27 minutes.     POSSIBLE D/C today, DEPENDING ON CLINICAL CONDITION.   Abdikadir Fohl M.D on 02/15/2017 at 9:41 AM  Between 7am to 6pm - Pager - [phone] After 6pm go to [email or web address] - password [email or web address] [facility]  Office  [phone]  CC: Primary care physician; [name], MD  Note: This dictation was prepared with Dragon dictation along with smaller phrase technology. Any transcriptional errors that result from this process are unintentional.

## 2017-02-16 ENCOUNTER — Ambulatory Visit: Payer: BLUE CROSS/BLUE SHIELD | Admitting: Cardiovascular Disease

## 2017-02-16 ENCOUNTER — Encounter: Payer: Self-pay | Admitting: Vascular Surgery

## 2017-02-16 ENCOUNTER — Telehealth: Payer: Self-pay | Admitting: Family Medicine

## 2017-02-16 DIAGNOSIS — F17210 Nicotine dependence, cigarettes, uncomplicated: Secondary | ICD-10-CM | POA: Diagnosis not present

## 2017-02-16 DIAGNOSIS — I10 Essential (primary) hypertension: Secondary | ICD-10-CM | POA: Diagnosis not present

## 2017-02-16 DIAGNOSIS — G620 Drug-induced polyneuropathy: Secondary | ICD-10-CM | POA: Diagnosis not present

## 2017-02-16 DIAGNOSIS — Z7982 Long term (current) use of aspirin: Secondary | ICD-10-CM | POA: Diagnosis not present

## 2017-02-16 DIAGNOSIS — K270 Acute peptic ulcer, site unspecified, with hemorrhage: Secondary | ICD-10-CM | POA: Diagnosis not present

## 2017-02-16 DIAGNOSIS — C50912 Malignant neoplasm of unspecified site of left female breast: Secondary | ICD-10-CM | POA: Diagnosis not present

## 2017-02-16 DIAGNOSIS — C7931 Secondary malignant neoplasm of brain: Secondary | ICD-10-CM | POA: Diagnosis not present

## 2017-02-16 DIAGNOSIS — C7951 Secondary malignant neoplasm of bone: Secondary | ICD-10-CM | POA: Diagnosis not present

## 2017-02-16 DIAGNOSIS — C787 Secondary malignant neoplasm of liver and intrahepatic bile duct: Secondary | ICD-10-CM | POA: Diagnosis not present

## 2017-02-16 NOTE — Telephone Encounter (Signed)
Mardene Celeste from [facility] is requesting a recert order for OT, PT and nursing. [phone]

## 2017-02-16 NOTE — Telephone Encounter (Signed)
Routed to Dr. Manuella Ghazi for approval Mardene Celeste from [facility] is requesting a recert order for OT, PT and nursing. [phone]

## 2017-02-16 NOTE — Telephone Encounter (Signed)
Scheduling has been changed as requested

## 2017-02-17 DIAGNOSIS — G620 Drug-induced polyneuropathy: Secondary | ICD-10-CM | POA: Diagnosis not present

## 2017-02-17 DIAGNOSIS — F17210 Nicotine dependence, cigarettes, uncomplicated: Secondary | ICD-10-CM | POA: Diagnosis not present

## 2017-02-17 DIAGNOSIS — C787 Secondary malignant neoplasm of liver and intrahepatic bile duct: Secondary | ICD-10-CM | POA: Diagnosis not present

## 2017-02-17 DIAGNOSIS — C7951 Secondary malignant neoplasm of bone: Secondary | ICD-10-CM | POA: Diagnosis not present

## 2017-02-17 DIAGNOSIS — Z7982 Long term (current) use of aspirin: Secondary | ICD-10-CM | POA: Diagnosis not present

## 2017-02-17 DIAGNOSIS — K270 Acute peptic ulcer, site unspecified, with hemorrhage: Secondary | ICD-10-CM | POA: Diagnosis not present

## 2017-02-17 DIAGNOSIS — C50912 Malignant neoplasm of unspecified site of left female breast: Secondary | ICD-10-CM | POA: Diagnosis not present

## 2017-02-17 DIAGNOSIS — C7931 Secondary malignant neoplasm of brain: Secondary | ICD-10-CM | POA: Diagnosis not present

## 2017-02-17 DIAGNOSIS — I10 Essential (primary) hypertension: Secondary | ICD-10-CM | POA: Diagnosis not present

## 2017-02-18 ENCOUNTER — Inpatient Hospital Stay (HOSPITAL_BASED_OUTPATIENT_CLINIC_OR_DEPARTMENT_OTHER): Payer: BLUE CROSS/BLUE SHIELD | Admitting: Internal Medicine

## 2017-02-18 ENCOUNTER — Inpatient Hospital Stay: Payer: BLUE CROSS/BLUE SHIELD

## 2017-02-18 ENCOUNTER — Encounter: Payer: Self-pay | Admitting: Gastroenterology

## 2017-02-18 VITALS — BP 115/78 | HR 123 | Temp 97.4°F | Ht 66.0 in | Wt 147.0 lb

## 2017-02-18 DIAGNOSIS — M21371 Foot drop, right foot: Secondary | ICD-10-CM | POA: Diagnosis not present

## 2017-02-18 DIAGNOSIS — Z923 Personal history of irradiation: Secondary | ICD-10-CM | POA: Diagnosis not present

## 2017-02-18 DIAGNOSIS — E876 Hypokalemia: Secondary | ICD-10-CM | POA: Diagnosis not present

## 2017-02-18 DIAGNOSIS — I471 Supraventricular tachycardia: Secondary | ICD-10-CM

## 2017-02-18 DIAGNOSIS — Z7901 Long term (current) use of anticoagulants: Secondary | ICD-10-CM | POA: Diagnosis not present

## 2017-02-18 DIAGNOSIS — Z170 Estrogen receptor positive status [ER+]: Secondary | ICD-10-CM

## 2017-02-18 DIAGNOSIS — M7989 Other specified soft tissue disorders: Secondary | ICD-10-CM

## 2017-02-18 DIAGNOSIS — F17210 Nicotine dependence, cigarettes, uncomplicated: Secondary | ICD-10-CM | POA: Diagnosis not present

## 2017-02-18 DIAGNOSIS — I252 Old myocardial infarction: Secondary | ICD-10-CM | POA: Diagnosis not present

## 2017-02-18 DIAGNOSIS — M8580 Other specified disorders of bone density and structure, unspecified site: Secondary | ICD-10-CM

## 2017-02-18 DIAGNOSIS — R7989 Other specified abnormal findings of blood chemistry: Secondary | ICD-10-CM

## 2017-02-18 DIAGNOSIS — Z79899 Other long term (current) drug therapy: Secondary | ICD-10-CM

## 2017-02-18 DIAGNOSIS — K922 Gastrointestinal hemorrhage, unspecified: Secondary | ICD-10-CM | POA: Diagnosis not present

## 2017-02-18 DIAGNOSIS — E785 Hyperlipidemia, unspecified: Secondary | ICD-10-CM

## 2017-02-18 DIAGNOSIS — C50412 Malignant neoplasm of upper-outer quadrant of left female breast: Secondary | ICD-10-CM | POA: Diagnosis not present

## 2017-02-18 DIAGNOSIS — D709 Neutropenia, unspecified: Secondary | ICD-10-CM

## 2017-02-18 DIAGNOSIS — R948 Abnormal results of function studies of other organs and systems: Secondary | ICD-10-CM | POA: Diagnosis not present

## 2017-02-18 DIAGNOSIS — B370 Candidal stomatitis: Secondary | ICD-10-CM

## 2017-02-18 DIAGNOSIS — Z7689 Persons encountering health services in other specified circumstances: Secondary | ICD-10-CM

## 2017-02-18 DIAGNOSIS — Z86711 Personal history of pulmonary embolism: Secondary | ICD-10-CM

## 2017-02-18 DIAGNOSIS — R040 Epistaxis: Secondary | ICD-10-CM | POA: Diagnosis not present

## 2017-02-18 DIAGNOSIS — Z5111 Encounter for antineoplastic chemotherapy: Secondary | ICD-10-CM | POA: Diagnosis not present

## 2017-02-18 DIAGNOSIS — Z7982 Long term (current) use of aspirin: Secondary | ICD-10-CM

## 2017-02-18 DIAGNOSIS — C7931 Secondary malignant neoplasm of brain: Secondary | ICD-10-CM | POA: Diagnosis not present

## 2017-02-18 DIAGNOSIS — I10 Essential (primary) hypertension: Secondary | ICD-10-CM | POA: Diagnosis not present

## 2017-02-18 DIAGNOSIS — G629 Polyneuropathy, unspecified: Secondary | ICD-10-CM | POA: Diagnosis not present

## 2017-02-18 DIAGNOSIS — E860 Dehydration: Secondary | ICD-10-CM

## 2017-02-18 DIAGNOSIS — R945 Abnormal results of liver function studies: Secondary | ICD-10-CM

## 2017-02-18 DIAGNOSIS — Z803 Family history of malignant neoplasm of breast: Secondary | ICD-10-CM

## 2017-02-18 DIAGNOSIS — Z808 Family history of malignant neoplasm of other organs or systems: Secondary | ICD-10-CM

## 2017-02-18 LAB — COMPREHENSIVE METABOLIC PANEL
ALT: 82 U/L — ABNORMAL HIGH (ref 14–54)
ANION GAP: 10 (ref 5–15)
AST: 214 U/L — ABNORMAL HIGH (ref 15–41)
Albumin: 2.5 g/dL — ABNORMAL LOW (ref 3.5–5.0)
Alkaline Phosphatase: 377 U/L — ABNORMAL HIGH (ref 38–126)
BUN: 9 mg/dL (ref 6–20)
CHLORIDE: 95 mmol/L — AB (ref 101–111)
CO2: 22 mmol/L (ref 22–32)
Calcium: 8.1 mg/dL — ABNORMAL LOW (ref 8.9–10.3)
Creatinine, Ser: 0.7 mg/dL (ref 0.44–1.00)
Glucose, Bld: 96 mg/dL (ref 65–99)
Potassium: 2.8 mmol/L — ABNORMAL LOW (ref 3.5–5.1)
SODIUM: 127 mmol/L — AB (ref 135–145)
Total Bilirubin: 5.7 mg/dL — ABNORMAL HIGH (ref 0.3–1.2)
Total Protein: 6 g/dL — ABNORMAL LOW (ref 6.5–8.1)

## 2017-02-18 MED ORDER — NYSTATIN 100000 UNIT/ML MT SUSP: 5.0000 mL | Freq: Four times a day (QID) | OROMUCOSAL | 0 refills | Status: DC

## 2017-02-18 MED ORDER — FULVESTRANT 250 MG/5ML IM SOLN
500.0000 mg | Freq: Once | INTRAMUSCULAR | Status: AC
  Administered 2017-02-18: 500 mg via INTRAMUSCULAR
  Filled 2017-02-18: qty 10

## 2017-02-18 MED ORDER — ABEMACICLIB 150 MG PO TABS: 150.0000 mg | ORAL_TABLET | Freq: Two times a day (BID) | ORAL | 6 refills | Status: DC

## 2017-02-18 NOTE — Progress Notes (Signed)
Patient here for follow up no changes since last appointment 

## 2017-02-18 NOTE — Assessment & Plan Note (Addendum)
#  RECURRENT Metastatic breast cancer- ER/PR positive HER-2/neu negative [status post liver biopsy February 2018; previously HER-2/neu positive]. Since 12/17/2016 # s/p eribulin- 4 doses. April 17th CT A chest- Improved liver disease/ response. However, poor tolerance to chemo- neutropenia/multiple hospitalization; recent left foot drop. We will discontinue Eribulin.   # Proceed with Faslodex today; will add abemacliclib. New prescription initiated.   # increased on LFTs- bilirubin -5.3; question etiology-progressive malignancy versus medications. stop cymbalta/ no issues with flecanaide. Also get stat ultrasound of the liver to rule out obstruction.  # Brain mets- s/p RT [finished Feb 28th]; symptomatic improvement.   MRI May 2018- improved.  # Bilateral lower extremities swelling- dependent edema/steroids/ improved. Continue  leg elevation  # PN G-3 worsened sec to foot drop- Discontinue  eribulin   # Hypokalemia- continue K supp.   # Oral thrush- continue nystatin.  # follow up in 2 weeks/labs; Tumor markers- faslodex. Unfortunately poor prognosis.   Addendum: Liver ultrasound shows multiple liver metastases; no signs of biliary obstruction.

## 2017-02-18 NOTE — Progress Notes (Signed)
[facility] OFFICE PROGRESS NOTE  Patient Care Team: [name], MD as PCP - General (Family Medicine) Bary Castilla, Forest Gleason, MD (General Surgery) [name], MD (Inactive) as Attending Physician (Internal Medicine)  No matching staging information was found for the patient.   Oncology History   # LEFT BREAST IDC; STAGE II [cT2N1] ER >90%; PR- 50-90%; her 2 NEU- POS; s/p Neoadj chemo; AUG 2016- TCH+P s/p Lumpec & partial ALND- path CR; s/p RT [finished Nov 2016];  adj Herceptin; HELD for Jan 19th 2017 [in DEC 2016-EF dropped from 63 to 51%; FEB 27th EF-42.9%]; JAN 2016 START Arimidex; May 2017- EF- 60%; May 24th 2017-Re-start Herceptin q 3W; July 28th STOP herceptin [finished 75m/m2; sec to Low EF; however 2017 Oct EF= 67%; improved]  # DEC 4th 2017- Start Neratinib 4 pills; DEC 11th 3 pills; STOPPED.  # FEB 2018- RECURRENCE ER/PR positive; Her 2 NEGATIVE [liver Bx]  # MARCH 1st 2018- Tax-Cytoxan  # FEB 2018- Brain mets [SBRT; Dr.Crystal; finished Feb 28th 2018]  # March 2018- Eribulin  # PN- G-2; may 2017- Cymbalta 641md;MARCH 2018-  acute vascular insuff of BIL LE [? Taxotere vasoconstriction on asprin]; # hemorragic shock LGIB [s/p colo; Dr.Wohl]    # April 2016- Liver Bx- NEG;   # Drop in EF from Herceptin [recovered OCt 27th- EF 65%]  # BMD- jan 2017- osteopenia     Carcinoma of upper-outer quadrant of left breast in female, estrogen receptor positive ([facility]  12/27/2014 Initial Diagnosis    Breast cancer of upper-outer quadrant of left female breast ([facility]       INTERVAL HISTORY:  A very pleasant [age]ear old female patient with above history of Recurrent stage IV ER/PR positive HER-2/neu negative [status post liver biopsy February 2018] Most recently on Eribulin- with multiple interruptions because of hospitalizations/cytopenias is here for follow-up.  Patient was again admitted to the hospital- when she was admitted for blood in  stools/black stools. Patient had a colonoscopy- no obvious bleeding source was noted. Patient also had IVC filter placed- and taken off anticoagulation. Interestingly patient also noted to have a right foot drop. MRI brain negative for any worsening metastases; in fact improved brain metastases status post radiation.  Patient is currently in a wheelchair. Continues to have weakness in the right leg. Patient is currently home. She is alone.  She has been abe to do her chores by herself. She has been able to go around in the hospital walker.  Denies any nausea vomiting. Denies any abdominal pain. Otherwise denies any significant diarrhea. No falls.    REVIEW OF SYSTEMS:  A complete 10 point review of system is done which is negative except mentioned above/history of present illness.   PAST MEDICAL HISTORY :  Past Medical History:  Diagnosis Date  . Chemotherapy-induced peripheral neuropathy ([facility]  . Epistaxis    a. 11/2016 in setting of asa/plavix-->silver nitrate cauterization.  . GI bleed    a. 11/2016 Admission w/ GIB and hypovolemic shock req 3u PRBC's;  b. 11/2016 ECG: gastritis & nonbleeding peptic ulcer; c. 11/2016 Conlonoscopy: rectal and sigmoid colonic ulcers.  . Heart attack ([facility] [place]   a. 1998 Cath @ [facility]: reportedly no intervention required.  . Marland Kitchenerceptin-induced cardiomyopathy ([facility]   a. In the setting of Herceptin Rx for breast cancer (initiated 12/2014); b. 03/2015 MUGA EF 64%; b. 08/2015 MUGA: EF 51%; c. 10/2015 MUGA: EF 44%; d. 11/2015 Echo: EF 45-50%; e. 01/2016 MUGA: EF  60%; f. 06/2016 MUGA EF 65%; g. 10/2016 MUGA: EF 61%;  h. 12/2016 Echo: EF 55-60%, gr1 DD.  Marland Kitchen Hyperlipidemia   . Hypertension   . Neuropathy   . Possible PAD (peripheral artery disease) ([place])    a. 11/2016 LE cyanosis and weak pulses-->CTA w/o significant Ao-BiFem dzs. ? distal dzs-->ASA/Plavix initiated by vascular surgery.  Marland Kitchen PSVT (paroxysmal supraventricular tachycardia) ([place])    a. Dx 11/2016.  . Pulmonary embolism  ([employer])    a. 12/2016 CTA Chest: small nonocclusive PE in inferior segment of the Left lingula, somewhat eccentric filling defect suggesting chronic rather than acute embolic event; b. 01/5731 LE U/S:  No DVT; c. 12/2016 Echo: Nl RV fxn, nl PASP.  Marland Kitchen Recurrent Metastatic breast cancer ([place])    a. Dx 2016: Stage II, ER positive, PR positive, HER-2/neu overexpressing of the left breast-->chemo/radiation; b. 10/2016 CT Abd/pelvis: diffuse liver mets, ill defined sclerotic bone lesions-T12;  c. 10/2016 MRI brain: metastatic lesion along L temporal lobe (19x25m) w/ extensive surrounding edema & 538mmidline shift to right.  . Sinus tachycardia     PAST SURGICAL HISTORY :   Past Surgical History:  Procedure Laterality Date  . BREAST BIOPSY Left 2016   Positive  . BREAST LUMPECTOMY WITH SENTINEL LYMPH NODE BIOPSY Left 05/23/2015   Procedure: LEFT BREAST WIDE EXCISION WITH AXILLARY DISSECTION, MASTOPLASTY ;  Surgeon: JeRobert BellowMD;  Location: [facility] ORS;  Service: General;  Laterality: Left;  . BREAST SURGERY Left 12/18/14   breast biopsy/INVASIVE DUCTAL CARCINOMA OF BREAST, NOTTINGHAM GRADE 2.  . Marland KitchenREAST SURGERY  05/23/2015.   Wide excision/mastoplasty, axillary dissection. No residual invasive cancer, positive for residual DCIS. 0/2 nodes identified on axillary dissection. (no SLN by technetium or methylene blue)  . CARDIAC CATHETERIZATION    . COLONOSCOPY WITH PROPOFOL N/A 12/10/2016   Procedure: COLONOSCOPY WITH PROPOFOL;  Surgeon: DaLucilla LameMD;  Location: [facility] ENDOSCOPY;  Service: Endoscopy;  Laterality: N/A;  . COLONOSCOPY WITH PROPOFOL N/A 02/13/2017   Procedure: COLONOSCOPY WITH PROPOFOL;  Surgeon: WoLucilla LameMD;  [facility];  Service: Endoscopy;  Laterality: N/A;  . ESOPHAGOGASTRODUODENOSCOPY (EGD) WITH PROPOFOL N/A 12/08/2016   Procedure: ESOPHAGOGASTRODUODENOSCOPY (EGD) WITH PROPOFOL;  Surgeon: DaLucilla LameMD;  Location: [facility] ENDOSCOPY;  Service: Endoscopy;  Laterality: N/A;   . IVC FILTER INSERTION N/A 02/15/2017   Procedure: IVC Filter Insertion;  Surgeon: DeAlgernon HuxleyMD;  [facility];  Service: Cardiovascular;  Laterality: N/A;  . PORTACATH PLACEMENT Right 12-31-14   Dr ByBary Castilla  FAMILY HISTORY :   Family History  Problem Relation Age of Onset  . Breast cancer Maternal Aunt   . Breast cancer Cousin   . Brain cancer Maternal Uncle     SOCIAL HISTORY:   Social History  Substance Use Topics  . Smoking status: Light Tobacco Smoker    Packs/day: 0.50    Years: 18.00    Types: Cigarettes  . Smokeless tobacco: Never Used  . Alcohol use No    ALLERGIES:  is allergic to no known allergies.  MEDICATIONS:  Current Outpatient Prescriptions  Medication Sig Dispense Refill  . anastrozole (ARIMIDEX) 1 MG tablet TAKE 1 TABLET DAILY 90 tablet 3  . diphenoxylate-atropine (LOMOTIL) 2.5-0.025 MG tablet Take 1 tablet by mouth 4 (four) times daily as needed for diarrhea or loose stools. Take it along with immodium 60 tablet 0  . feeding supplement, ENSURE ENLIVE, (ENSURE ENLIVE) LIQD Take 237 mLs by mouth 2 (two) times  daily between meals. 237 mL 12  . flecainide (TAMBOCOR) 50 MG tablet Take 1 tablet (50 mg total) by mouth 2 (two) times daily. 60 tablet 1  . KLOR-CON M20 20 MEQ tablet Take 20 mEq by mouth 2 (two) times daily.  3  . metoprolol tartrate (LOPRESSOR) 25 MG tablet Take 0.5 tablets (12.5 mg total) by mouth 2 (two) times daily. Hold if you're Systolic Blood pressure is less than 100. 90 tablet 3  . ondansetron (ZOFRAN) 8 MG tablet Take 1 tablet (8 mg total) by mouth every 8 (eight) hours as needed for nausea or vomiting (start 3 days; after chemo). 40 tablet 1  . pantoprazole (PROTONIX) 40 MG tablet Take 1 tablet (40 mg total) by mouth 2 (two) times daily. 60 tablet 2  . polyethylene glycol (MIRALAX / GLYCOLAX) packet Take 17 g by mouth daily. 14 each 0  . Abemaciclib 150 MG TABS Take 150 mg by mouth every 12 (twelve) hours. 60 tablet 6   . nystatin (MYCOSTATIN) 100000 UNIT/ML suspension Take 5 mLs (500,000 Units total) by mouth 4 (four) times daily. 60 mL 0   No current facility-administered medications for this visit.    Facility-Administered Medications Ordered in Other Visits  Medication Dose Route Frequency Provider Last Rate Last Dose  . 0.9 %  sodium chloride infusion   Intravenous Continuous Pandit, Sandeep, MD      . 0.9 %  sodium chloride infusion   Intravenous Continuous Lloyd Huger, MD 999 mL/hr at 04/24/15 1520    . heparin lock flush 100 unit/mL  500 Units Intravenous Once Berenzon, Dmitriy, MD      . sodium chloride 0.9 % injection 10 mL  10 mL Intravenous PRN [name], MD   10 mL at 04/04/15 1440  . sodium chloride flush (NS) 0.9 % injection 10 mL  10 mL Intravenous PRN Berenzon, Dmitriy, MD      . Tbo-Filgrastim (GRANIX) injection 480 mcg  480 mcg Subcutaneous Once Cammie Sickle, MD        PHYSICAL EXAMINATION: ECOG PERFORMANCE STATUS: 0 - Asymptomatic  BP 115/78 (BP Location: Right Arm, Patient Position: Sitting)   Pulse (!) 123   Temp 97.4 F (36.3 C) (Tympanic)   Ht 5' 6"  (1.676 m)   Wt 147 lb (66.7 kg)   BMI 23.73 kg/m   Filed Weights   02/18/17 1006  Weight: 147 lb (66.7 kg)    GENERAL: Well-nourished well-developed; Alert, no distress and comfortable.   EYES: no pallor. Positive for icterus She is in a wheelchair.She is alone.  OROPHARYNX: No  ulceration; good dentition ; Positive for thrush.  NECK: supple, no masses felt LYMPH:  no palpable lymphadenopathy in the cervical, axillary or inguinal regions LUNGS: clear to auscultation and  No wheeze or crackles HEART/CVS: regular rate & rhythm and no murmurs; 1+ bilateral lower extremity edema.  ABDOMEN:abdomen soft, non-tender and normal bowel sounds Musculoskeletal:no cyanosis of digits and no clubbing  PSYCH: alert & oriented x 3 with fluent speech NEURO: no focal motor/sensory deficits- except for foot drop on  the right side.   LABORATORY DATA:  I have reviewed the data as listed    Component Value Date/Time   NA 127 (L) 02/18/2017 0920   NA 135 01/08/2015 0850   K 2.8 (L) 02/18/2017 0920   K 3.7 01/08/2015 0850   CL 95 (L) 02/18/2017 0920   CL 101 01/08/2015 0850   CO2 22 02/18/2017 0920   CO2 26 01/08/2015  0850   GLUCOSE 96 02/18/2017 0920   GLUCOSE 161 (H) 01/08/2015 0850   BUN 9 02/18/2017 0920   BUN 17 01/08/2015 0850   CREATININE 0.70 02/18/2017 0920   CREATININE 0.82 01/22/2015 1559   CALCIUM 8.1 (L) 02/18/2017 0920   CALCIUM 9.3 01/08/2015 0850   PROT 6.0 (L) 02/18/2017 0920   PROT 6.8 01/22/2015 1559   ALBUMIN 2.5 (L) 02/18/2017 0920   ALBUMIN 3.9 01/22/2015 1559   AST 214 (H) 02/18/2017 0920   AST 34 01/22/2015 1559   ALT 82 (H) 02/18/2017 0920   ALT 42 01/22/2015 1559   ALKPHOS 377 (H) 02/18/2017 0920   ALKPHOS 157 (H) 01/22/2015 1559   BILITOT 5.7 (H) 02/18/2017 0920   BILITOT 0.2 (L) 01/22/2015 1559   GFRNONAA >60 02/18/2017 0920   GFRNONAA >60 01/22/2015 1559   GFRAA >60 02/18/2017 0920   GFRAA >60 01/22/2015 1559    No results found for: SPEP, UPEP  Lab Results  Component Value Date   WBC 4.5 02/18/2017   NEUTROABS 3.5 02/18/2017   HGB 10.1 (L) 02/18/2017   HCT 29.4 (L) 02/18/2017   MCV 88.4 02/18/2017   PLT 103 (L) 02/18/2017      Chemistry      Component Value Date/Time   NA 127 (L) 02/18/2017 0920   NA 135 01/08/2015 0850   K 2.8 (L) 02/18/2017 0920   K 3.7 01/08/2015 0850   CL 95 (L) 02/18/2017 0920   CL 101 01/08/2015 0850   CO2 22 02/18/2017 0920   CO2 26 01/08/2015 0850   BUN 9 02/18/2017 0920   BUN 17 01/08/2015 0850   CREATININE 0.70 02/18/2017 0920   CREATININE 0.82 01/22/2015 1559      Component Value Date/Time   CALCIUM 8.1 (L) 02/18/2017 0920   CALCIUM 9.3 01/08/2015 0850   ALKPHOS 377 (H) 02/18/2017 0920   ALKPHOS 157 (H) 01/22/2015 1559   AST 214 (H) 02/18/2017 0920   AST 34 01/22/2015 1559   ALT 82 (H) 02/18/2017  0920   ALT 42 01/22/2015 1559   BILITOT 5.7 (H) 02/18/2017 0920   BILITOT 0.2 (L) 01/22/2015 1559            LABORATORY DATA:  I have reviewed the data as listed    Component Value Date/Time   NA 127 (L) 02/18/2017 0920   NA 135 01/08/2015 0850   K 2.8 (L) 02/18/2017 0920   K 3.7 01/08/2015 0850   CL 95 (L) 02/18/2017 0920   CL 101 01/08/2015 0850   CO2 22 02/18/2017 0920   CO2 26 01/08/2015 0850   GLUCOSE 96 02/18/2017 0920   GLUCOSE 161 (H) 01/08/2015 0850   BUN 9 02/18/2017 0920   BUN 17 01/08/2015 0850   CREATININE 0.70 02/18/2017 0920   CREATININE 0.82 01/22/2015 1559   CALCIUM 8.1 (L) 02/18/2017 0920   CALCIUM 9.3 01/08/2015 0850   PROT 6.0 (L) 02/18/2017 0920   PROT 6.8 01/22/2015 1559   ALBUMIN 2.5 (L) 02/18/2017 0920   ALBUMIN 3.9 01/22/2015 1559   AST 214 (H) 02/18/2017 0920   AST 34 01/22/2015 1559   ALT 82 (H) 02/18/2017 0920   ALT 42 01/22/2015 1559   ALKPHOS 377 (H) 02/18/2017 0920   ALKPHOS 157 (H) 01/22/2015 1559   BILITOT 5.7 (H) 02/18/2017 0920   BILITOT 0.2 (L) 01/22/2015 1559   GFRNONAA >60 02/18/2017 0920   GFRNONAA >60 01/22/2015 1559   GFRAA >60 02/18/2017 0920   GFRAA >60 01/22/2015  1559    No results found for: SPEP, UPEP  Lab Results  Component Value Date   WBC 4.5 02/18/2017   NEUTROABS 3.5 02/18/2017   HGB 10.1 (L) 02/18/2017   HCT 29.4 (L) 02/18/2017   MCV 88.4 02/18/2017   PLT 103 (L) 02/18/2017      Chemistry      Component Value Date/Time   NA 127 (L) 02/18/2017 0920   NA 135 01/08/2015 0850   K 2.8 (L) 02/18/2017 0920   K 3.7 01/08/2015 0850   CL 95 (L) 02/18/2017 0920   CL 101 01/08/2015 0850   CO2 22 02/18/2017 0920   CO2 26 01/08/2015 0850   BUN 9 02/18/2017 0920   BUN 17 01/08/2015 0850   CREATININE 0.70 02/18/2017 0920   CREATININE 0.82 01/22/2015 1559      Component Value Date/Time   CALCIUM 8.1 (L) 02/18/2017 0920   CALCIUM 9.3 01/08/2015 0850   ALKPHOS 377 (H) 02/18/2017 0920   ALKPHOS 157 (H)  01/22/2015 1559   AST 214 (H) 02/18/2017 0920   AST 34 01/22/2015 1559   ALT 82 (H) 02/18/2017 0920   ALT 42 01/22/2015 1559   BILITOT 5.7 (H) 02/18/2017 0920   BILITOT 0.2 (L) 01/22/2015 1559     IMPRESSION: Normal left ventricular wall motion with estimated ejection fraction of 65%.   Electronically Signed   By: Marijo Sanes M.D.   On: 07/27/2016 09:04  RADIOGRAPHIC STUDIES: I have personally reviewed the radiological images as listed and agreed with the findings in the report. No results found.   ASSESSMENT & PLAN:  Carcinoma of upper-outer quadrant of left breast in female, estrogen receptor positive ([place]) # RECURRENT Metastatic breast cancer- ER/PR positive HER-2/neu negative [status post liver biopsy February 2018; previously HER-2/neu positive]. Since 12/17/2016 # s/p eribulin- 4 doses. April 17th CT A chest- Improved liver disease/ response. However, poor tolerance to chemo- neutropenia/multiple hospitalization; recent left foot drop. We will discontinue Eribulin.   # Proceed with Faslodex today; will add abemacliclib. New prescription initiated.   # increased on LFTs- bilirubin -5.3; question etiology-progressive malignancy versus medications. stop cymbalta/ no issues with flecanaide. Also get stat ultrasound of the liver to rule out obstruction.  # Brain mets- s/p RT [finished Feb 28th]; symptomatic improvement.   MRI May 2018- improved.  # Bilateral lower extremities swelling- dependent edema/steroids/ improved. Continue  leg elevation  # PN G-3 worsened sec to foot drop- Discontinue  eribulin   # Hypokalemia- continue K supp.   # Oral thrush- continue nystatin.  # follow up in 2 weeks/labs; Tumor markers- faslodex. Unfortunately poor prognosis.   Addendum: Liver ultrasound shows multiple liver metastases; no signs of biliary obstruction.   Orders Placed This Encounter  Procedures  . US Abdomen Limited    Standing Status:   Future    Standing Expiration  Date:   02/18/2018    Order Specific Question:   Reason for Exam (SYMPTOM  OR DIAGNOSIS REQUIRED)    Answer:   liver mets; sudden increase in biluribin- ? biliaryobstruction.    Order Specific Question:   Preferred imaging location?    Answer:   [facility]  . US Abdomen Limited RUQ    Standing Status:   Future    Number of Occurrences:   1    Standing Expiration Date:   04/20/2018    Order Specific Question:   Reason for Exam (SYMPTOM  OR DIAGNOSIS REQUIRED)    Answer:   Elevated LFT  Order Specific Question:   Preferred imaging location?    Answer:   [facility]  . CBC with Differential/Platelet    Standing Status:   Future    Standing Expiration Date:   02/18/2018  . Comprehensive metabolic panel    Standing Status:   Future    Standing Expiration Date:   02/18/2018  . Cancer antigen 27.29    Standing Status:   Future    Standing Expiration Date:   02/18/2018  . Cancer antigen 15-3    Standing Status:   Future    Standing Expiration Date:   02/18/2018   All questions were answered. The patient knows to call the clinic with any problems, questions or concerns.      Cammie Sickle, MD 02/22/2017 4:03 PM

## 2017-02-19 ENCOUNTER — Ambulatory Visit
Admission: RE | Admit: 2017-02-19 | Discharge: 2017-02-19 | Disposition: A | Discharge status: Home / Self Care | Payer: BLUE CROSS/BLUE SHIELD | Source: Ambulatory Visit | Attending: Internal Medicine | Admitting: Internal Medicine

## 2017-02-19 DIAGNOSIS — R945 Abnormal results of liver function studies: Secondary | ICD-10-CM | POA: Diagnosis not present

## 2017-02-19 DIAGNOSIS — C787 Secondary malignant neoplasm of liver and intrahepatic bile duct: Secondary | ICD-10-CM | POA: Insufficient documentation

## 2017-02-19 DIAGNOSIS — R188 Other ascites: Secondary | ICD-10-CM | POA: Diagnosis not present

## 2017-02-19 DIAGNOSIS — R7989 Other specified abnormal findings of blood chemistry: Secondary | ICD-10-CM | POA: Diagnosis not present

## 2017-02-19 LAB — CBC WITH DIFFERENTIAL/PLATELET
BAND NEUTROPHILS: 5 %
Basophils Absolute: 0 10*3/uL (ref 0–0.1)
Basophils Relative: 1 %
Eosinophils Absolute: 0 10*3/uL (ref 0–0.7)
Eosinophils Relative: 0 %
HCT: 29.4 % — ABNORMAL LOW (ref 35.0–47.0)
Hemoglobin: 10.1 g/dL — ABNORMAL LOW (ref 12.0–16.0)
LYMPHS ABS: 0.5 10*3/uL — AB (ref 1.0–3.6)
Lymphocytes Relative: 10 %
MCH: 30.4 pg (ref 26.0–34.0)
MCHC: 34.4 g/dL (ref 32.0–36.0)
MCV: 88.4 fL (ref 80.0–100.0)
MONO ABS: 0.5 10*3/uL (ref 0.2–0.9)
MONOS PCT: 11 %
NRBC: 3 /100{WBCs} — AB
Neutro Abs: 3.5 10*3/uL (ref 1.4–6.5)
Neutrophils Relative %: 73 %
PLATELETS: 103 10*3/uL — AB (ref 150–440)
RBC: 3.33 MIL/uL — AB (ref 3.80–5.20)
RDW: 23.9 % — AB (ref 11.5–14.5)
Smear Review: DECREASED
WBC: 4.5 10*3/uL (ref 3.6–11.0)

## 2017-02-19 LAB — CANCER ANTIGEN 27.29: CA 27.29: 95.5 U/mL — AB (ref 0.0–38.6)

## 2017-02-23 ENCOUNTER — Ambulatory Visit: Payer: BLUE CROSS/BLUE SHIELD | Admitting: Family Medicine

## 2017-02-23 ENCOUNTER — Telehealth: Payer: Self-pay | Admitting: Family Medicine

## 2017-02-23 DIAGNOSIS — K270 Acute peptic ulcer, site unspecified, with hemorrhage: Secondary | ICD-10-CM | POA: Diagnosis not present

## 2017-02-23 DIAGNOSIS — C50912 Malignant neoplasm of unspecified site of left female breast: Secondary | ICD-10-CM | POA: Diagnosis not present

## 2017-02-23 DIAGNOSIS — C787 Secondary malignant neoplasm of liver and intrahepatic bile duct: Secondary | ICD-10-CM | POA: Diagnosis not present

## 2017-02-23 DIAGNOSIS — I10 Essential (primary) hypertension: Secondary | ICD-10-CM | POA: Diagnosis not present

## 2017-02-23 DIAGNOSIS — F17210 Nicotine dependence, cigarettes, uncomplicated: Secondary | ICD-10-CM | POA: Diagnosis not present

## 2017-02-23 DIAGNOSIS — C7931 Secondary malignant neoplasm of brain: Secondary | ICD-10-CM | POA: Diagnosis not present

## 2017-02-23 DIAGNOSIS — C7951 Secondary malignant neoplasm of bone: Secondary | ICD-10-CM | POA: Diagnosis not present

## 2017-02-23 DIAGNOSIS — G620 Drug-induced polyneuropathy: Secondary | ICD-10-CM | POA: Diagnosis not present

## 2017-02-23 DIAGNOSIS — Z7982 Long term (current) use of aspirin: Secondary | ICD-10-CM | POA: Diagnosis not present

## 2017-02-23 NOTE — Telephone Encounter (Signed)
Sarah Horne WITH [facility] NEEDS AN ORDER FOR 3 MORE WEEKS TO CONTINUE MONITORING HER HEART RATE.

## 2017-02-24 ENCOUNTER — Telehealth: Payer: Self-pay | Admitting: *Deleted

## 2017-02-24 ENCOUNTER — Encounter: Payer: Self-pay | Admitting: Family Medicine

## 2017-02-24 ENCOUNTER — Ambulatory Visit (INDEPENDENT_AMBULATORY_CARE_PROVIDER_SITE_OTHER): Payer: BLUE CROSS/BLUE SHIELD | Admitting: Family Medicine

## 2017-02-24 VITALS — BP 132/76 | HR 115 | Temp 98.2°F | Resp 20 | Ht 66.0 in | Wt 153.2 lb

## 2017-02-24 DIAGNOSIS — C7951 Secondary malignant neoplasm of bone: Secondary | ICD-10-CM | POA: Diagnosis not present

## 2017-02-24 DIAGNOSIS — D62 Acute posthemorrhagic anemia: Secondary | ICD-10-CM

## 2017-02-24 DIAGNOSIS — C50912 Malignant neoplasm of unspecified site of left female breast: Secondary | ICD-10-CM | POA: Diagnosis not present

## 2017-02-24 DIAGNOSIS — Z7982 Long term (current) use of aspirin: Secondary | ICD-10-CM | POA: Diagnosis not present

## 2017-02-24 DIAGNOSIS — G620 Drug-induced polyneuropathy: Secondary | ICD-10-CM | POA: Diagnosis not present

## 2017-02-24 DIAGNOSIS — K922 Gastrointestinal hemorrhage, unspecified: Secondary | ICD-10-CM

## 2017-02-24 DIAGNOSIS — K270 Acute peptic ulcer, site unspecified, with hemorrhage: Secondary | ICD-10-CM | POA: Diagnosis not present

## 2017-02-24 DIAGNOSIS — F17210 Nicotine dependence, cigarettes, uncomplicated: Secondary | ICD-10-CM | POA: Diagnosis not present

## 2017-02-24 DIAGNOSIS — C787 Secondary malignant neoplasm of liver and intrahepatic bile duct: Secondary | ICD-10-CM | POA: Diagnosis not present

## 2017-02-24 DIAGNOSIS — I10 Essential (primary) hypertension: Secondary | ICD-10-CM | POA: Diagnosis not present

## 2017-02-24 DIAGNOSIS — C7931 Secondary malignant neoplasm of brain: Secondary | ICD-10-CM | POA: Diagnosis not present

## 2017-02-24 DIAGNOSIS — C50919 Malignant neoplasm of unspecified site of unspecified female breast: Secondary | ICD-10-CM

## 2017-02-24 NOTE — Telephone Encounter (Signed)
[facility] - for patient: Sarah Horne [birth year].  needs information for drug insurance qualification and prior auth for verenzio.  phone call:  [phone]; case ref number: [id number].  Call returned to [place] by RN.  Per [facility], further clarification of dosing on script was needed since the RX was transferred from [facility] (as a 3rd party). Dosing scheduled for Sarah Horne was verified with pharmacy.  Per [facility], they needed verbal permission from our office to send the Abemaciclib starter kit to patient.  I explained that the MD will find ok with express script providing a starter kit and information to the patient. She states that this would include over the counter immodium for the patient. I told her that this would be fine by the provider.  She states that a prior auth for the medication is still needed.   [place] Phone # [phone] - member ID number [id number].   The prior auth process will be initiated by RN tomorrow.

## 2017-02-24 NOTE — Progress Notes (Signed)
Name: Sarah Horne   MRN: [id number]    DOB: 1955-10-20   Date:02/24/2017       Progress Note  Subjective  Chief Complaint  Chief Complaint  Patient presents with  . Hospitalization Follow-up    GI Bleed, had multiple polyps during colonoscopy that were oozing blood    HPI  Patient presents for follow up after a lower GI bleed, admitted to [facility] on May 17th, underwent colonoscopy during the hospitalization which showed polyps in sigmoid colon ozzing blood. These were treated with argon beam coagulation, she had become profoundly anemia, hGb dropping to 8.4, then recovering back up to 10.1 upon discharge. Because of the GI bleed, anticoagulation has been on hold, and she received IVC filter while in the hospital.   She denies any episodes of blood in urine or stool, melena, weakness (more than baseline), has numbness in feet and hands.    Past Medical History:  Diagnosis Date  . Chemotherapy-induced peripheral neuropathy ([place])   . Epistaxis    a. 11/2016 in setting of asa/plavix-->silver nitrate cauterization.  . GI bleed    a. 11/2016 Admission w/ GIB and hypovolemic shock req 3u PRBC's;  b. 11/2016 ECG: gastritis & nonbleeding peptic ulcer; c. 11/2016 Conlonoscopy: rectal and sigmoid colonic ulcers.  . Heart attack ([place])    a. 1998 Cath @ [facility]: reportedly no intervention required.  Marland Kitchen Herceptin-induced cardiomyopathy ([place])    a. In the setting of Herceptin Rx for breast cancer (initiated 12/2014); b. 03/2015 MUGA EF 64%; b. 08/2015 MUGA: EF 51%; c. 10/2015 MUGA: EF 44%; d. 11/2015 Echo: EF 45-50%; e. 01/2016 MUGA: EF 60%; f. 06/2016 MUGA EF 65%; g. 10/2016 MUGA: EF 61%;  h. 12/2016 Echo: EF 55-60%, gr1 DD.  Marland Kitchen Hyperlipidemia   . Hypertension   . Neuropathy   . Possible PAD (peripheral artery disease) ([place])    a. 11/2016 LE cyanosis and weak pulses-->CTA w/o significant Ao-BiFem dzs. ? distal dzs-->ASA/Plavix initiated by vascular surgery.  Marland Kitchen PSVT (paroxysmal supraventricular tachycardia) ([place])     a. Dx 11/2016.  . Pulmonary embolism ([place])    a. 12/2016 CTA Chest: small nonocclusive PE in inferior segment of the Left lingula, somewhat eccentric filling defect suggesting chronic rather than acute embolic event; b. 12/8887 LE U/S:  No DVT; c. 12/2016 Echo: Nl RV fxn, nl PASP.  Marland Kitchen Recurrent Metastatic breast cancer ([place])    a. Dx 2016: Stage II, ER positive, PR positive, HER-2/neu overexpressing of the left breast-->chemo/radiation; b. 10/2016 CT Abd/pelvis: diffuse liver mets, ill defined sclerotic bone lesions-T12;  c. 10/2016 MRI brain: metastatic lesion along L temporal lobe (19x21m) w/ extensive surrounding edema & 575mmidline shift to right.  . Sinus tachycardia     Past Surgical History:  Procedure Laterality Date  . BREAST BIOPSY Left 2016   Positive  . BREAST LUMPECTOMY WITH SENTINEL LYMPH NODE BIOPSY Left 05/23/2015   Procedure: LEFT BREAST WIDE EXCISION WITH AXILLARY DISSECTION, MASTOPLASTY ;  Surgeon: JeRobert BellowMD;  Location: [facility] ORS;  Service: General;  Laterality: Left;  . BREAST SURGERY Left 12/18/14   breast biopsy/INVASIVE DUCTAL CARCINOMA OF BREAST, NOTTINGHAM GRADE 2.  . Marland KitchenREAST SURGERY  05/23/2015.   Wide excision/mastoplasty, axillary dissection. No residual invasive cancer, positive for residual DCIS. 0/2 nodes identified on axillary dissection. (no SLN by technetium or methylene blue)  . CARDIAC CATHETERIZATION    . COLONOSCOPY WITH PROPOFOL N/A 12/10/2016   Procedure: COLONOSCOPY WITH PROPOFOL;  Surgeon: DaLucilla LameMD;  Location:  [facility];  Service: Endoscopy;  Laterality: N/A;  . COLONOSCOPY WITH PROPOFOL N/A 02/13/2017   Procedure: COLONOSCOPY WITH PROPOFOL;  Surgeon: Lucilla Lame, MD;  Location: [facility] ENDOSCOPY;  Service: Endoscopy;  Laterality: N/A;  . ESOPHAGOGASTRODUODENOSCOPY (EGD) WITH PROPOFOL N/A 12/08/2016   Procedure: ESOPHAGOGASTRODUODENOSCOPY (EGD) WITH PROPOFOL;  Surgeon: Lucilla Lame, MD;  Location: [facility] ENDOSCOPY;  Service: Endoscopy;   Laterality: N/A;  . IVC FILTER INSERTION N/A 02/15/2017   Procedure: IVC Filter Insertion;  Surgeon: Algernon Huxley, MD;  Location: [facility] LAB;  Service: Cardiovascular;  Laterality: N/A;  . PORTACATH PLACEMENT Right 12-31-14   Dr Bary Castilla    Family History  Problem Relation Age of Onset  . Breast cancer Maternal Aunt   . Breast cancer Cousin   . Brain cancer Maternal Uncle     Social History   Social History  . Marital status: Divorced    Spouse name: N/A  . Number of children: N/A  . Years of education: N/A   Occupational History  . Not on file.   Social History Main Topics  . Smoking status: Light Tobacco Smoker    Packs/day: 0.50    Years: 18.00    Types: Cigarettes  . Smokeless tobacco: Never Used  . Alcohol use No  . Drug use: No  . Sexual activity: Not on file   Other Topics Concern  . Not on file   Social History Narrative   Lives in [place] by herself.     Current Outpatient Prescriptions:  .  Abemaciclib 150 MG TABS, Take 150 mg by mouth every 12 (twelve) hours., Disp: 60 tablet, Rfl: 6 .  anastrozole (ARIMIDEX) 1 MG tablet, TAKE 1 TABLET DAILY, Disp: 90 tablet, Rfl: 3 .  diphenoxylate-atropine (LOMOTIL) 2.5-0.025 MG tablet, Take 1 tablet by mouth 4 (four) times daily as needed for diarrhea or loose stools. Take it along with immodium, Disp: 60 tablet, Rfl: 0 .  feeding supplement, ENSURE ENLIVE, (ENSURE ENLIVE) LIQD, Take 237 mLs by mouth 2 (two) times daily between meals., Disp: 237 mL, Rfl: 12 .  flecainide (TAMBOCOR) 50 MG tablet, Take 1 tablet (50 mg total) by mouth 2 (two) times daily., Disp: 60 tablet, Rfl: 1 .  KLOR-CON M20 20 MEQ tablet, Take 20 mEq by mouth 2 (two) times daily., Disp: , Rfl: 3 .  metoprolol tartrate (LOPRESSOR) 25 MG tablet, Take 0.5 tablets (12.5 mg total) by mouth 2 (two) times daily. Hold if you're Systolic Blood pressure is less than 100., Disp: 90 tablet, Rfl: 3 .  nystatin (MYCOSTATIN) 100000 UNIT/ML suspension, Take  5 mLs (500,000 Units total) by mouth 4 (four) times daily., Disp: 60 mL, Rfl: 0 .  ondansetron (ZOFRAN) 8 MG tablet, Take 1 tablet (8 mg total) by mouth every 8 (eight) hours as needed for nausea or vomiting (start 3 days; after chemo)., Disp: 40 tablet, Rfl: 1 .  pantoprazole (PROTONIX) 40 MG tablet, Take 1 tablet (40 mg total) by mouth 2 (two) times daily., Disp: 60 tablet, Rfl: 2 .  polyethylene glycol (MIRALAX / GLYCOLAX) packet, Take 17 g by mouth daily., Disp: 14 each, Rfl: 0 No current facility-administered medications for this visit.   Facility-Administered Medications Ordered in Other Visits:  .  0.9 %  sodium chloride infusion, , Intravenous, Continuous, Pandit, Sandeep, MD .  0.9 %  sodium chloride infusion, , Intravenous, Continuous, Finnegan, Kathlene November, MD, Last Rate: 999 mL/hr at 04/24/15 1520 .  heparin lock flush 100 unit/mL, 500 Units, Intravenous, Once,  Berenzon, Dmitriy, MD .  sodium chloride 0.9 % injection 10 mL, 10 mL, Intravenous, PRN, Ma Hillock, Sandeep, MD, 10 mL at 04/04/15 1440 .  sodium chloride flush (NS) 0.9 % injection 10 mL, 10 mL, Intravenous, PRN, Berenzon, Dmitriy, MD .  [facility]) injection 480 mcg, 480 mcg, Subcutaneous, Once, Cammie Sickle, MD  Allergies  Allergen Reactions  . No Known Allergies      ROS  Please see history of present illness for complete discussion of ROS  Objective  Vitals:   02/24/17 1345  BP: 132/76  Pulse: (!) 115  Resp: 20  Temp: 98.2 F (36.8 C)  TempSrc: Oral  SpO2: 95%  Weight: 153 lb 3.2 oz (69.5 kg)  Height: 5' 6"  (1.676 m)    Physical Exam  Constitutional: She is oriented to person, place, and time and well-developed, well-nourished, and in no distress.  HENT:  Head: Normocephalic and atraumatic.  Cardiovascular: Regular rhythm, S1 normal, S2 normal and normal heart sounds.  Tachycardia present.   Pulmonary/Chest: Effort normal and breath sounds normal. No respiratory distress. She has no  wheezes. She has no rhonchi.  Abdominal: Soft. Bowel sounds are normal. There is no tenderness.  Neurological: She is alert and oriented to person, place, and time.  Nursing note and vitals reviewed.     Assessment & Plan  1. Lower GI bleeding Notes and discharge summaries from Hospital reviewed and discussed with patient in detail  2. Anemia associated with acute blood loss Improving, obtain CBC - CBC with Differential/Platelet  3. Metastatic breast cancer [facility]) On chemotherapy and being followed by oncology   Shloimy Michalski [facility] 02/24/2017 1:54 PM

## 2017-02-25 ENCOUNTER — Telehealth: Payer: Self-pay | Admitting: Internal Medicine

## 2017-02-25 ENCOUNTER — Inpatient Hospital Stay: Payer: BLUE CROSS/BLUE SHIELD

## 2017-02-25 ENCOUNTER — Encounter: Payer: Self-pay | Admitting: *Deleted

## 2017-02-25 DIAGNOSIS — G620 Drug-induced polyneuropathy: Secondary | ICD-10-CM | POA: Diagnosis not present

## 2017-02-25 DIAGNOSIS — K270 Acute peptic ulcer, site unspecified, with hemorrhage: Secondary | ICD-10-CM | POA: Diagnosis not present

## 2017-02-25 DIAGNOSIS — C787 Secondary malignant neoplasm of liver and intrahepatic bile duct: Secondary | ICD-10-CM | POA: Diagnosis not present

## 2017-02-25 DIAGNOSIS — C50912 Malignant neoplasm of unspecified site of left female breast: Secondary | ICD-10-CM | POA: Diagnosis not present

## 2017-02-25 DIAGNOSIS — F17210 Nicotine dependence, cigarettes, uncomplicated: Secondary | ICD-10-CM | POA: Diagnosis not present

## 2017-02-25 DIAGNOSIS — I10 Essential (primary) hypertension: Secondary | ICD-10-CM | POA: Diagnosis not present

## 2017-02-25 DIAGNOSIS — C7951 Secondary malignant neoplasm of bone: Secondary | ICD-10-CM | POA: Diagnosis not present

## 2017-02-25 DIAGNOSIS — C7931 Secondary malignant neoplasm of brain: Secondary | ICD-10-CM | POA: Diagnosis not present

## 2017-02-25 DIAGNOSIS — Z7982 Long term (current) use of aspirin: Secondary | ICD-10-CM | POA: Diagnosis not present

## 2017-02-25 LAB — CBC WITH DIFFERENTIAL/PLATELET
BASOS ABS: 31 {cells}/uL (ref 0–200)
Basophils Relative: 1 %
EOS ABS: 0 {cells}/uL — AB (ref 15–500)
Eosinophils Relative: 0 %
HCT: 31.2 % — ABNORMAL LOW (ref 35.0–45.0)
HEMOGLOBIN: 9.9 g/dL — AB (ref 11.7–15.5)
Lymphocytes Relative: 21 %
Lymphs Abs: 651 cells/uL — ABNORMAL LOW (ref 850–3900)
MCH: 28.9 pg (ref 27.0–33.0)
MCHC: 31.7 g/dL — ABNORMAL LOW (ref 32.0–36.0)
MCV: 91.2 fL (ref 80.0–100.0)
MONO ABS: 589 {cells}/uL (ref 200–950)
MPV: 8.6 fL (ref 7.5–12.5)
Monocytes Relative: 19 %
NEUTROS ABS: 1829 {cells}/uL (ref 1500–7800)
Neutrophils Relative %: 59 %
Platelets: 153 10*3/uL (ref 140–400)
RBC: 3.42 MIL/uL — ABNORMAL LOW (ref 3.80–5.10)
RDW: 23.3 % — ABNORMAL HIGH (ref 11.0–15.0)
WBC: 3.1 10*3/uL — ABNORMAL LOW (ref 3.8–10.8)

## 2017-02-25 NOTE — Progress Notes (Signed)
Patient showed up in clinic anticipated apt with lab/md/chemo. Pt informed her apts were not until next week. Pt states that she was not aware of the plan of care. Pt provided with her last printed AVS. She gave verbal understanding of the plan of care and future apts.

## 2017-02-25 NOTE — Telephone Encounter (Signed)
[place] OTHER [id number]

## 2017-02-25 NOTE — Telephone Encounter (Signed)
Heather- please check which covering provider is this pt is going to follow up/ so that I can do a hand off as she is very sick.

## 2017-02-25 NOTE — Telephone Encounter (Signed)
Contacted [employer] for prior auth. - [place] Phone # [phone]. RN Provided member ID number [id number].  Prior auth process initiated.   Case reference # [id number].  Per insurance, may take up to 4 days business day to make a determination of coverage.

## 2017-02-26 NOTE — Telephone Encounter (Signed)
Patient will see Dr. Janese Banks on 03/05/17.  I performed the prior auth on her verzenio yesterday (still pending insurance approval-per insurance 4 business day minimal turn around time)

## 2017-03-04 ENCOUNTER — Encounter: Payer: Self-pay | Admitting: Physician Assistant

## 2017-03-04 ENCOUNTER — Ambulatory Visit (INDEPENDENT_AMBULATORY_CARE_PROVIDER_SITE_OTHER): Payer: BLUE CROSS/BLUE SHIELD | Admitting: Physician Assistant

## 2017-03-04 VITALS — BP 100/68 | HR 111 | Ht 66.0 in | Wt 153.2 lb

## 2017-03-04 DIAGNOSIS — I471 Supraventricular tachycardia: Secondary | ICD-10-CM | POA: Diagnosis not present

## 2017-03-04 DIAGNOSIS — I427 Cardiomyopathy due to drug and external agent: Secondary | ICD-10-CM | POA: Diagnosis not present

## 2017-03-04 DIAGNOSIS — Z79899 Other long term (current) drug therapy: Secondary | ICD-10-CM | POA: Diagnosis not present

## 2017-03-04 DIAGNOSIS — E8809 Other disorders of plasma-protein metabolism, not elsewhere classified: Secondary | ICD-10-CM

## 2017-03-04 DIAGNOSIS — I872 Venous insufficiency (chronic) (peripheral): Secondary | ICD-10-CM

## 2017-03-04 DIAGNOSIS — I2699 Other pulmonary embolism without acute cor pulmonale: Secondary | ICD-10-CM

## 2017-03-04 DIAGNOSIS — I429 Cardiomyopathy, unspecified: Secondary | ICD-10-CM | POA: Diagnosis not present

## 2017-03-04 DIAGNOSIS — E876 Hypokalemia: Secondary | ICD-10-CM

## 2017-03-04 DIAGNOSIS — T451X5A Adverse effect of antineoplastic and immunosuppressive drugs, initial encounter: Secondary | ICD-10-CM

## 2017-03-04 DIAGNOSIS — R0989 Other specified symptoms and signs involving the circulatory and respiratory systems: Secondary | ICD-10-CM

## 2017-03-04 NOTE — Patient Instructions (Addendum)
Medication Instructions:  Your physician recommends that you continue on your current medications as directed. Please refer to the Current Medication list given to you today.   Labwork: Your physician recommends that you return for lab work in: TODAY (CMET, CBC, Mg).    Testing/Procedures: none  Follow-Up: Your physician recommends that you schedule a follow-up appointment in: [place].   Any Other Special Instructions Will Be Listed Below (If Applicable).  - Your provider recommends you drink 3 Gatorades per day.  - Please call our office in 1 week with blood pressure and heart rate readings. Please monitor your blood pressure and heart rate at home.   If you need a refill on your cardiac medications before your next appointment, please call your pharmacy.

## 2017-03-04 NOTE — Progress Notes (Signed)
Cardiology Office Note Date:  03/04/2017  Patient ID:  Sarah Horne, Sarah Horne 1956/06/01, MRN [id number] PCP:  [name], MD  Cardiologist:  Dr. Fletcher Anon, MD    Chief Complaint: Hospital follow up  History of Present Illness: Sarah Horne is a 61 y.o. female with history of history of Herceptin-induced cardiomyopathy with patient reported possible prior myocardial infarction in 1998 where she underwent cardiac catheterization at [facility] and did not require revascularization, she was treated medically (details of that admission are not available). She also has metastatic breast cancer diagnosed in 2016 with metastasis to the liver and now brain, recently diagnosed pulmonary embolism and mid April 2018, hypertension, hyperlipidemia, and tobacco abuse who presents for hospital follow up of recent admission to [facility] in mid May.   She started treatment with Taxotere/Carboplatin/Herceptin/Perjeta in April 2016. She underwent a MUGA scan in July 2016 which showed an ejection fraction of 63%. This dropped to 51% in December. Given that she was asymptomatic, Herceptin was given in December. She underwent repeat MUGA scan on 11/25/2015 that showed a drop in ejection fraction to 44%. TTE in March 2017 showed an ejection fraction of 45-50% with no significant valvular abnormalities or pulmonary hypertension. At that time she was switched from atenolol to carvedilol. She underwent repeat MUGA scan in May 2017 which showed an ejection fraction of 60% area her Herceptin was resumed at that time. She was last seen by Dr. Fletcher Anon on 04/14/2016 and doing well at that time. She underwent repeat MUGA scan in 06/2016 that showed ejection fraction of 65%. She was continued on Herceptin. She was admitted to [facility] in mid February 2018 with left sided headache and expressive aphasia. CT scan of the head showed left frontoparietal mass with surrounding edema consistent with new brain metastasis from her primary breast cancer. She  was treated with IV Decadron followed by Decadron taper per oncology. MRI brain showed metastatic lesion along left temporal lobe with extensive surrounding edema and a 5 mm midline shift to the right. The mass measured approximately 19 x 18 mm. She last underwent MUGA scan on 11/16/2016 that showed ejection fraction of 61% that was minimally decreased from 65% on 07/24/2016. An oncology follow-up, it was recommended patient undergo palliative chemotherapy after radiation to the brain with recommendation of Taxotere/Cytoxan. She was subsequently admitted to [facility] in early March 2018 with hypovolemic shock secondary to GI bleed with EGD showing gastritis and nonbleeding peptic ulcer and colonoscopy showing ulcers of the rectum and sigmoid region. She required 3 units of packed red blood cell transfusion. She was seen by GI who okayed the initiation of aspirin and Plavix for the treatment of the patient's peripheral arterial disease given tobacco abuse. During this admission she was noted to have bilateral lower extremity cyanosis and pallor with cold feet. Pulses were initially only noted on Doppler ultrasound though status post transfusions as above patient was noted to have strong palpable peripheral lower extremity pulses. She was seen by vascular surgery who recommended no vascular surgical intervention at that time. During this admission patient's beta blocker was held, though restarted upon discharge. She was seen in the ED on 12/19/2016 with epistaxis requiring silver nitrate cauterization. She was seen by oncology on 12/25/2016 for routine follow-up and noted to be dehydrated at that time secondary to poor by mouth intake secondary to pain with swallowing/thrush. She was treated with IV hydration on 3/30 with planned Granix infusion on 3/31. While in the cancer center on 3/31  patient was noted to be hypotensive with blood pressure 88/51 as well as tachycardic with heart rate of 171 BPM. She was transferred to the  ER where 12-lead EKG showed SVT, 165 BPM, no acute ST-T changes. Patient reportedly converted to sinus tachycardia at 103 bpm prior to MD evaluating and treating patient. No labs were drawn as patient had recently had cbc/cmet drawn at the [facility] one day prior that showed a potassium of 3.5, albumin 2.4, AST 118, ALT 88, alkaline phosphatase 343, serum creatinine 0.45, white blood cell count 0.6, hemoglobin 9.4, platelet count 160. She was asymptomatic in the ER. ER physician discussed case with oncology who felt patient could be discharged from the ED. Since that ED visit on 3/31 patient has received IV infusions for dehydration on 4/1, 4/2, and 4/3. At each visit patient was noted to be tachycardic with heart rates ranging from the 110s to 120s. There are no EKGs for review from those dates. She was seen in mid April for evaluation of elevated heart rate. At that time it was unclear if she had been taking her carvedilol. She was also noted to have soft blood pressures in the 90s to low 696E systolic. She was doing well at that time. Her carvedilol was changed to metoprolol 12.5 twice a day with titration as needed for heart rate control and less antihypertensive effect given her some blood pressures. She was admitted in mid April 2018 for acute pulmonary embolism. Dopplers of lower extremities were negative for DVT. TTE showed EF 55-60%, normal wall motion, GR1DD, left atrium normal size, RV systolic function was normal, PASP normal. She was placed on low dose Xarelto at time of discharge. She was noted to have a mildly elevated troponin that was felt to be secondary to supply demand ischemia from pulmonary embolism. She was noted to be tachycardic with possible SVT felt to be in the setting of hypokalemia. She continued to be mildly orthostatic thus her metoprolol was not titrated up. She was started on flecainide for rhythm control. She was most recently admitted in mid May 2018 for GI bleed. She  underwent colonoscopy that showed a few polyps in the sigmoid colon that were treated with argon beam coagulation. These polyps are noted to be oozing blood. Hemoglobin remained stable. Also noted was right foot drop/weakness with MRI showing no evidence of CVA. There was some improvement in her brain metastatic disease without cerebral edema. Regarding her pulmonary embolism in the setting of her GI bleed vascular was consult was obtained and an IVC filter was placed on 5/21. She was again noted to be hypokalemic and repleted. She again had a mild troponin elevation with a peak of 0.06 felt to be in the setting of demand ischemia from the above.  Overall, patient is doing well today. She notes some generalized fatigue since her hospital discharge and may. She also notes bilateral upper and lower extremity cramping. She denies any chest pain, shortness of breath, palpitations, dizziness, presyncope, syncope, nausea, or vomiting. She continues to note blood pressure on the softer side with at home checks as well as heart rate ranging from the 90s to low 100s BPM. Her metoprolol has been discontinued due to soft blood pressures. She is also no longer taking potassium supplementation. Weight is stable. Appetite is stable. She has planned follow-up with cancer Center on 6/8. Chronic venous insufficiency stable. Has previously worn compression stockings though does not like to wear these as they are difficult to get on  an takeoff. She does elevate her legs most of the time when she is sitting. She is tolerating flecainide without any issues.   Past Medical History:  Diagnosis Date  . Chemotherapy-induced peripheral neuropathy ([place])   . Epistaxis    a. 11/2016 in setting of asa/plavix-->silver nitrate cauterization.  . GI bleed    a. 11/2016 Admission w/ GIB and hypovolemic shock req 3u PRBC's;  b. 11/2016 ECG: gastritis & nonbleeding peptic ulcer; c. 11/2016 Conlonoscopy: rectal and sigmoid colonic ulcers.  .  Heart attack ([place])    a. 1998 Cath @ [facility]: reportedly no intervention required.  Marland Kitchen Herceptin-induced cardiomyopathy ([place])    a. In the setting of Herceptin Rx for breast cancer (initiated 12/2014); b. 03/2015 MUGA EF 64%; b. 08/2015 MUGA: EF 51%; c. 10/2015 MUGA: EF 44%; d. 11/2015 Echo: EF 45-50%; e. 01/2016 MUGA: EF 60%; f. 06/2016 MUGA EF 65%; g. 10/2016 MUGA: EF 61%;  h. 12/2016 Echo: EF 55-60%, gr1 DD.  Marland Kitchen Hyperlipidemia   . Hypertension   . Neuropathy   . Possible PAD (peripheral artery disease) ([place])    a. 11/2016 LE cyanosis and weak pulses-->CTA w/o significant Ao-BiFem dzs. ? distal dzs-->ASA/Plavix initiated by vascular surgery.  Marland Kitchen PSVT (paroxysmal supraventricular tachycardia) ([place])    a. Dx 11/2016.  . Pulmonary embolism ([place])    a. 12/2016 CTA Chest: small nonocclusive PE in inferior segment of the Left lingula, somewhat eccentric filling defect suggesting chronic rather than acute embolic event; b. 01/4007 LE U/S:  No DVT; c. 12/2016 Echo: Nl RV fxn, nl PASP.  Marland Kitchen Recurrent Metastatic breast cancer ([place])    a. Dx 2016: Stage II, ER positive, PR positive, HER-2/neu overexpressing of the left breast-->chemo/radiation; b. 10/2016 CT Abd/pelvis: diffuse liver mets, ill defined sclerotic bone lesions-T12;  c. 10/2016 MRI brain: metastatic lesion along L temporal lobe (19x84m) w/ extensive surrounding edema & 543mmidline shift to right.  . Sinus tachycardia     Past Surgical History:  Procedure Laterality Date  . BREAST BIOPSY Left 2016   Positive  . BREAST LUMPECTOMY WITH SENTINEL LYMPH NODE BIOPSY Left 05/23/2015   Procedure: LEFT BREAST WIDE EXCISION WITH AXILLARY DISSECTION, MASTOPLASTY ;  Surgeon: JeRobert BellowMD;  Location: [facility] ORS;  Service: General;  Laterality: Left;  . BREAST SURGERY Left 12/18/14   breast biopsy/INVASIVE DUCTAL CARCINOMA OF BREAST, NOTTINGHAM GRADE 2.  . Marland KitchenREAST SURGERY  05/23/2015.   Wide excision/mastoplasty, axillary dissection. No residual invasive cancer,  positive for residual DCIS. 0/2 nodes identified on axillary dissection. (no SLN by technetium or methylene blue)  . CARDIAC CATHETERIZATION    . COLONOSCOPY WITH PROPOFOL N/A 12/10/2016   Procedure: COLONOSCOPY WITH PROPOFOL;  Surgeon: DaLucilla LameMD;  Location: [facility] ENDOSCOPY;  Service: Endoscopy;  Laterality: N/A;  . COLONOSCOPY WITH PROPOFOL N/A 02/13/2017   Procedure: COLONOSCOPY WITH PROPOFOL;  Surgeon: WoLucilla LameMD;  [facility];  Service: Endoscopy;  Laterality: N/A;  . ESOPHAGOGASTRODUODENOSCOPY (EGD) WITH PROPOFOL N/A 12/08/2016   Procedure: ESOPHAGOGASTRODUODENOSCOPY (EGD) WITH PROPOFOL;  Surgeon: DaLucilla LameMD;  Location: [facility] ENDOSCOPY;  Service: Endoscopy;  Laterality: N/A;  . IVC FILTER INSERTION N/A 02/15/2017   Procedure: IVC Filter Insertion;  Surgeon: DeAlgernon HuxleyMD;  [facility];  Service: Cardiovascular;  Laterality: N/A;  . PORTACATH PLACEMENT Right 12-31-14   Dr ByBary Castilla  Current Outpatient Prescriptions  Medication Sig Dispense Refill  . anastrozole (ARIMIDEX) 1 MG tablet TAKE 1 TABLET DAILY 90 tablet 3  .  diphenoxylate-atropine (LOMOTIL) 2.5-0.025 MG tablet Take 1 tablet by mouth 4 (four) times daily as needed for diarrhea or loose stools. Take it along with immodium 60 tablet 0  . feeding supplement, ENSURE ENLIVE, (ENSURE ENLIVE) LIQD Take 237 mLs by mouth 2 (two) times daily between meals. 237 mL 12  . flecainide (TAMBOCOR) 50 MG tablet Take 1 tablet (50 mg total) by mouth 2 (two) times daily. 60 tablet 1  . nystatin (MYCOSTATIN) 100000 UNIT/ML suspension Take 5 mLs (500,000 Units total) by mouth 4 (four) times daily. 60 mL 0  . ondansetron (ZOFRAN) 8 MG tablet Take 1 tablet (8 mg total) by mouth every 8 (eight) hours as needed for nausea or vomiting (start 3 days; after chemo). 40 tablet 1  . polyethylene glycol (MIRALAX / GLYCOLAX) packet Take 17 g by mouth daily. 14 each 0   No current facility-administered medications for  this visit.    Facility-Administered Medications Ordered in Other Visits  Medication Dose Route Frequency Provider Last Rate Last Dose  . 0.9 %  sodium chloride infusion   Intravenous Continuous Pandit, Sandeep, MD      . 0.9 %  sodium chloride infusion   Intravenous Continuous Lloyd Huger, MD 999 mL/hr at 04/24/15 1520    . heparin lock flush 100 unit/mL  500 Units Intravenous Once Berenzon, Dmitriy, MD      . sodium chloride 0.9 % injection 10 mL  10 mL Intravenous PRN [name], MD   10 mL at 04/04/15 1440  . sodium chloride flush (NS) 0.9 % injection 10 mL  10 mL Intravenous PRN Berenzon, Dmitriy, MD      . Tbo-Filgrastim (GRANIX) injection 480 mcg  480 mcg Subcutaneous Once Cammie Sickle, MD        Allergies:   No known allergies   Social History:  The patient  reports that she has been smoking Cigarettes.  She has a 9.00 pack-year smoking history. She has never used smokeless tobacco. She reports that she does not drink alcohol or use drugs.   Family History:  The patient's family history includes Brain cancer in her maternal uncle; Breast cancer in her cousin and maternal aunt; Diabetes in her mother; Hypertension in her mother; Stroke in her mother.  ROS:   Review of Systems  Constitutional: Positive for malaise/fatigue. Negative for chills, diaphoresis, fever and weight loss.  HENT: Negative for congestion.   Eyes: Negative for discharge and redness.  Respiratory: Negative for cough, hemoptysis, sputum production, shortness of breath and wheezing.   Cardiovascular: Positive for leg swelling. Negative for chest pain, palpitations, orthopnea, claudication and PND.  Gastrointestinal: Negative for abdominal pain, blood in stool, heartburn, melena, nausea and vomiting.       Abdominal distension   Genitourinary: Negative for hematuria.  Musculoskeletal: Positive for joint pain and myalgias. Negative for falls.  Skin: Negative for rash.  Neurological: Positive  for weakness. Negative for dizziness, tingling, tremors, sensory change, speech change, focal weakness, seizures, loss of consciousness and headaches.  Endo/Heme/Allergies: Does not bruise/bleed easily.  Psychiatric/Behavioral: Negative for substance abuse. The patient is not nervous/anxious.   All other systems reviewed and are negative.    PHYSICAL EXAM:  VS:  BP 100/68 (BP Location: Left Arm, Patient Position: Sitting, Cuff Size: Normal)   Pulse (!) 111   Ht _0  (1.676 m)   Wt 153 lb 4 oz (69.5 kg)   BMI 24.74 kg/m  BMI: Body mass index is 24.74 kg/m.  Physical Exam  Constitutional: She is oriented to person, place, and time. She appears well-developed and well-nourished.  HENT:  Head: Normocephalic and atraumatic.  Eyes: Right eye exhibits no discharge. Left eye exhibits no discharge.  Neck: Normal range of motion. JVD present.  JVD elevated ~ 5-6 cm  Cardiovascular: Normal rate, regular rhythm, S1 normal, S2 normal and normal heart sounds.  Exam reveals no distant heart sounds, no friction rub, no midsystolic click and no opening snap.   No murmur heard. Pulmonary/Chest: Effort normal. No respiratory distress. She has decreased breath sounds in the right lower field and the left lower field. She has no wheezes. She has no rhonchi. She has no rales. She exhibits no tenderness.  Abdominal: Soft. She exhibits no distension. There is no tenderness.  Musculoskeletal: She exhibits edema.  1+ pretibial edema bilaterally  Neurological: She is alert and oriented to person, place, and time.  Skin: Skin is warm and dry. No cyanosis. Nails show no clubbing.  Psychiatric: She has a normal mood and affect. Her speech is normal and behavior is normal. Judgment and thought content normal.     EKG:  Was ordered and interpreted by me today. Shows sinus tachycardia, 111 bpm, possible prior inferior infarct, cannot urle out old anterior infarct, no acute st/t changes   Recent Labs: 01/12/2017:  B Natriuretic Peptide 167.0; TSH 1.410 02/15/2017: Magnesium 1.7 02/18/2017: ALT 82; BUN 9; Creatinine, Ser 0.70; Potassium 2.8; Sodium 127 02/24/2017: Hemoglobin 9.9; Platelets 153  01/18/2017: Cholesterol 136; HDL 19; LDL Cholesterol 92; Total CHOL/HDL Ratio 7.2; Triglycerides 127; VLDL 25   Estimated Creatinine Clearance: 67.4 mL/min (by C-G formula based on SCr of 0.7 mg/dL).   Wt Readings from Last 3 Encounters:  03/04/17 153 lb 4 oz (69.5 kg)  02/24/17 153 lb 3.2 oz (69.5 kg)  02/18/17 147 lb (66.7 kg)     Other studies reviewed: Additional studies/records reviewed today include: summarized above  ASSESSMENT AND PLAN:  1. Paroxysmal SVT: Currently in sinus rhythm with mildly tachycardic heart rate in the 110s. Asymptomatic. No longer on metoprolol given soft blood pressures. Tolerating flecainide without issues, continue. No longer taking KCl repletion. Check bmet today as below and replete potassium as indicated. Should she have recurrent SVT could consider EP referral for evaluation of possible SVT ablation. Check TSH and magnesium along with bmet as above.  2. Chemotherapy-induced cardiomyopathy: Recent TTE from 12/2016 as above with normal EF and right-sided pressure. Weight stable. She does have mild JVD along with mild ascites on exam today. However, she is asymptomatic. She does not have adequate blood pressure room to start on by mouth diuresis. She reports drinking 3 large Gatorade bottles of water daily. Perhaps she should replace some of this water with Gatorade daily in an effort to pump up her blood pressure which thus may allow for gentle diuresis. Cannot rule out volume overload secondary to metastatic carcinoma versus third spacing from hypoalbuminemia. Recommend supplementation with boost or Ensure. Consider formal abdominal imaging though will defer to oncology.  3. Likely venous insufficiency versus third spacing from hypoalbuminemia: As previously one compression stockings  though prefers not to wear them again. Recommend Ace wraps and leg elevation.  4. Labile HTN: Blood pressure on the soft side this morning at 333 mmHg systolic. Mildly tachycardic in the 110s bpm. No longer on any antihypertensive medication. Push fluids as above. Recommend slowly changing positions in an effort to avoid orthostatic hypotension/fall/syncope. Use walker when ambulating.  5. HLD: No longer on a statin  likely secondary to elevated LFTs from metastatic breast cancer. Las LDL of 92 from 12/2016.  6. Hypokalemia: Last potassium of 2.8 from 5/24. Repletion continued by oncology, though it appears this has been discontinued for uncertain reasons. Check bmet and magnesium today and replete as indicated.  7. Metastatic breast cancer: Followed by oncology.   8. Recently diagnosed PE: Status post IVC filter. Asymptomatic. Anticoagulation was discontinued given recent GI bleeding.   9. Elevated LFTs: Likely from metastatic carcinoma vs medications. Per oncology, flecainide ok. RUQ ultrasound without obstruction.   10. Recent GI bleed: Followed by GI. Check CBC. No longer on anticoagulation as above.  11. Weakness: Consider home health PT. Defer to PCP.  Disposition: F/u with me or Dr. Fletcher Anon in 1 month. Call with BP and heart rate in 1 week.   Current medicines are reviewed at length with the patient today. The patient did not have any concerns regarding medicines.  Melvern Banker PA-C 03/04/2017 9:12 AM     [address] [place], [place] [address] [phone]

## 2017-03-05 ENCOUNTER — Telehealth: Payer: Self-pay | Admitting: *Deleted

## 2017-03-05 ENCOUNTER — Inpatient Hospital Stay (HOSPITAL_BASED_OUTPATIENT_CLINIC_OR_DEPARTMENT_OTHER): Payer: BLUE CROSS/BLUE SHIELD | Admitting: Oncology

## 2017-03-05 ENCOUNTER — Inpatient Hospital Stay: Payer: BLUE CROSS/BLUE SHIELD

## 2017-03-05 ENCOUNTER — Inpatient Hospital Stay: Payer: BLUE CROSS/BLUE SHIELD | Attending: Oncology

## 2017-03-05 VITALS — BP 112/76 | HR 99 | Temp 97.8°F | Wt 154.4 lb

## 2017-03-05 DIAGNOSIS — I10 Essential (primary) hypertension: Secondary | ICD-10-CM | POA: Diagnosis not present

## 2017-03-05 DIAGNOSIS — G620 Drug-induced polyneuropathy: Secondary | ICD-10-CM | POA: Insufficient documentation

## 2017-03-05 DIAGNOSIS — M8580 Other specified disorders of bone density and structure, unspecified site: Secondary | ICD-10-CM | POA: Insufficient documentation

## 2017-03-05 DIAGNOSIS — C50919 Malignant neoplasm of unspecified site of unspecified female breast: Secondary | ICD-10-CM

## 2017-03-05 DIAGNOSIS — F17210 Nicotine dependence, cigarettes, uncomplicated: Secondary | ICD-10-CM

## 2017-03-05 DIAGNOSIS — Z79811 Long term (current) use of aromatase inhibitors: Secondary | ICD-10-CM | POA: Insufficient documentation

## 2017-03-05 DIAGNOSIS — Z79818 Long term (current) use of other agents affecting estrogen receptors and estrogen levels: Secondary | ICD-10-CM | POA: Insufficient documentation

## 2017-03-05 DIAGNOSIS — Z170 Estrogen receptor positive status [ER+]: Secondary | ICD-10-CM | POA: Insufficient documentation

## 2017-03-05 DIAGNOSIS — I700 Atherosclerosis of aorta: Secondary | ICD-10-CM | POA: Insufficient documentation

## 2017-03-05 DIAGNOSIS — I252 Old myocardial infarction: Secondary | ICD-10-CM | POA: Insufficient documentation

## 2017-03-05 DIAGNOSIS — E876 Hypokalemia: Secondary | ICD-10-CM | POA: Insufficient documentation

## 2017-03-05 DIAGNOSIS — R630 Anorexia: Secondary | ICD-10-CM | POA: Diagnosis not present

## 2017-03-05 DIAGNOSIS — R531 Weakness: Secondary | ICD-10-CM | POA: Diagnosis not present

## 2017-03-05 DIAGNOSIS — E860 Dehydration: Secondary | ICD-10-CM

## 2017-03-05 DIAGNOSIS — R000 Tachycardia, unspecified: Secondary | ICD-10-CM | POA: Insufficient documentation

## 2017-03-05 DIAGNOSIS — Z7189 Other specified counseling: Secondary | ICD-10-CM

## 2017-03-05 DIAGNOSIS — Z79899 Other long term (current) drug therapy: Secondary | ICD-10-CM | POA: Insufficient documentation

## 2017-03-05 DIAGNOSIS — R180 Malignant ascites: Secondary | ICD-10-CM | POA: Diagnosis not present

## 2017-03-05 DIAGNOSIS — Z86711 Personal history of pulmonary embolism: Secondary | ICD-10-CM

## 2017-03-05 DIAGNOSIS — M21371 Foot drop, right foot: Secondary | ICD-10-CM

## 2017-03-05 DIAGNOSIS — Z803 Family history of malignant neoplasm of breast: Secondary | ICD-10-CM | POA: Diagnosis not present

## 2017-03-05 DIAGNOSIS — I471 Supraventricular tachycardia: Secondary | ICD-10-CM | POA: Diagnosis not present

## 2017-03-05 DIAGNOSIS — C50412 Malignant neoplasm of upper-outer quadrant of left female breast: Secondary | ICD-10-CM

## 2017-03-05 DIAGNOSIS — R7989 Other specified abnormal findings of blood chemistry: Secondary | ICD-10-CM

## 2017-03-05 DIAGNOSIS — T451X5A Adverse effect of antineoplastic and immunosuppressive drugs, initial encounter: Secondary | ICD-10-CM

## 2017-03-05 DIAGNOSIS — R945 Abnormal results of liver function studies: Secondary | ICD-10-CM

## 2017-03-05 DIAGNOSIS — J90 Pleural effusion, not elsewhere classified: Secondary | ICD-10-CM | POA: Diagnosis not present

## 2017-03-05 DIAGNOSIS — R748 Abnormal levels of other serum enzymes: Secondary | ICD-10-CM

## 2017-03-05 DIAGNOSIS — E785 Hyperlipidemia, unspecified: Secondary | ICD-10-CM

## 2017-03-05 LAB — CBC WITH DIFFERENTIAL/PLATELET
BASOS: 1 %
Basophils Absolute: 0 10*3/uL (ref 0.0–0.2)
Basophils Absolute: 0 10*3/uL (ref 0–0.1)
Basophils Relative: 0 %
EOS (ABSOLUTE): 0.1 10*3/uL (ref 0.0–0.4)
EOS PCT: 2 %
EOS: 2 %
Eosinophils Absolute: 0.1 10*3/uL (ref 0–0.7)
HCT: 28.4 % — ABNORMAL LOW (ref 35.0–47.0)
HEMATOCRIT: 33.3 % — AB (ref 34.0–46.6)
HEMOGLOBIN: 10.2 g/dL — AB (ref 11.1–15.9)
Hemoglobin: 9.6 g/dL — ABNORMAL LOW (ref 12.0–16.0)
Immature Grans (Abs): 0 10*3/uL (ref 0.0–0.1)
Immature Granulocytes: 0 %
LYMPHS ABS: 0.7 10*3/uL (ref 0.7–3.1)
Lymphocytes Relative: 23 %
Lymphs Abs: 0.7 10*3/uL — ABNORMAL LOW (ref 1.0–3.6)
Lymphs: 18 %
MCH: 27.3 pg (ref 26.6–33.0)
MCH: 30 pg (ref 26.0–34.0)
MCHC: 30.6 g/dL — AB (ref 31.5–35.7)
MCHC: 33.9 g/dL (ref 32.0–36.0)
MCV: 89 fL (ref 79–97)
MCV: 88.3 fL (ref 80.0–100.0)
MONOCYTES: 6 %
MONOS PCT: 6 %
Monocytes Absolute: 0.3 10*3/uL (ref 0.1–0.9)
Monocytes Absolute: 0.2 10*3/uL (ref 0.2–0.9)
NEUTROS ABS: 3.1 10*3/uL (ref 1.4–7.0)
NEUTROS ABS: 2.2 10*3/uL (ref 1.4–6.5)
NRBC: 1 /100{WBCs} — AB
Neutrophils Relative %: 69 %
Neutrophils: 73 %
PLATELETS: 192 10*3/uL (ref 150–440)
Platelets: 225 10*3/uL (ref 150–379)
RBC: 3.74 x10E6/uL — ABNORMAL LOW (ref 3.77–5.28)
RBC: 3.22 MIL/uL — AB (ref 3.80–5.20)
RDW: 21.9 % — ABNORMAL HIGH (ref 12.3–15.4)
RDW: 23.4 % — ABNORMAL HIGH (ref 11.5–14.5)
WBC: 4.2 10*3/uL (ref 3.4–10.8)
WBC: 3.2 10*3/uL — AB (ref 3.6–11.0)

## 2017-03-05 LAB — COMPREHENSIVE METABOLIC PANEL
ALBUMIN: 2.5 g/dL — AB (ref 3.6–4.8)
ALBUMIN: 2.1 g/dL — AB (ref 3.5–5.0)
ALT: 117 IU/L — ABNORMAL HIGH (ref 0–32)
ALT: 93 U/L — AB (ref 14–54)
AST: 288 IU/L — ABNORMAL HIGH (ref 0–40)
AST: 217 U/L — AB (ref 15–41)
Albumin/Globulin Ratio: 0.6 — ABNORMAL LOW (ref 1.2–2.2)
Alkaline Phosphatase: 402 IU/L — ABNORMAL HIGH (ref 39–117)
Alkaline Phosphatase: 286 U/L — ABNORMAL HIGH (ref 38–126)
Anion gap: 7 (ref 5–15)
BILIRUBIN TOTAL: 6.2 mg/dL — AB (ref 0.0–1.2)
BUN / CREAT RATIO: 12 (ref 12–28)
BUN: 8 mg/dL (ref 8–27)
BUN: 7 mg/dL (ref 6–20)
CO2: 24 mmol/L (ref 18–29)
CO2: 27 mmol/L (ref 22–32)
CREATININE: 0.67 mg/dL (ref 0.57–1.00)
CREATININE: 0.61 mg/dL (ref 0.44–1.00)
Calcium: 8.5 mg/dL — ABNORMAL LOW (ref 8.7–10.3)
Calcium: 8.2 mg/dL — ABNORMAL LOW (ref 8.9–10.3)
Chloride: 94 mmol/L — ABNORMAL LOW (ref 96–106)
Chloride: 98 mmol/L — ABNORMAL LOW (ref 101–111)
GFR calc Af Amer: >60 mL/min (ref >60)
GFR calc non Af Amer: 94 mL/min/{1.73_m2} (ref >59)
GFR, EST AFRICAN AMERICAN: 108 mL/min/{1.73_m2} (ref >59)
GLUCOSE: 95 mg/dL (ref 65–99)
GLUCOSE: 104 mg/dL — AB (ref 65–99)
Globulin, Total: 3.9 g/dL (ref 1.5–4.5)
Potassium: 2.8 mmol/L — ABNORMAL LOW (ref 3.5–5.2)
Potassium: 2.5 mmol/L — CL (ref 3.5–5.1)
Sodium: 135 mmol/L (ref 134–144)
Sodium: 132 mmol/L — ABNORMAL LOW (ref 135–145)
TOTAL PROTEIN: 6.4 g/dL (ref 6.0–8.5)
Total Bilirubin: 6.4 mg/dL — ABNORMAL HIGH (ref 0.3–1.2)
Total Protein: 6 g/dL — ABNORMAL LOW (ref 6.5–8.1)

## 2017-03-05 LAB — MAGNESIUM: Magnesium: 1.6 mg/dL (ref 1.6–2.3)

## 2017-03-05 MED ORDER — HEPARIN SOD (PORK) LOCK FLUSH 100 UNIT/ML IV SOLN
500.0000 [IU] | Freq: Once | INTRAVENOUS | Status: AC
  Administered 2017-03-05: 500 [IU] via INTRAVENOUS

## 2017-03-05 MED ORDER — SODIUM CHLORIDE 0.9 % IV SOLN
Freq: Once | INTRAVENOUS | Status: AC
  Administered 2017-03-05: 13:00:00 via INTRAVENOUS
  Filled 2017-03-05: qty 250

## 2017-03-05 MED ORDER — PREGABALIN 75 MG PO CAPS: 75.0000 mg | ORAL_CAPSULE | Freq: Two times a day (BID) | ORAL | 2 refills | Status: DC

## 2017-03-05 MED ORDER — ABEMACICLIB 150 MG PO TABS: 150.0000 mg | ORAL_TABLET | Freq: Every day | ORAL | 6 refills | Status: DC

## 2017-03-05 MED ORDER — TBO-FILGRASTIM 480 MCG/0.8ML ~~LOC~~ SOSY
480.0000 ug | PREFILLED_SYRINGE | Freq: Once | SUBCUTANEOUS | Status: DC
Start: 2017-03-05 — End: 2017-03-05

## 2017-03-05 MED ORDER — FULVESTRANT 250 MG/5ML IM SOLN
500.0000 mg | Freq: Once | INTRAMUSCULAR | Status: AC
  Administered 2017-03-05: 500 mg via INTRAMUSCULAR
  Filled 2017-03-05: qty 10

## 2017-03-05 MED ORDER — SODIUM CHLORIDE 0.9% FLUSH
10.0000 mL | INTRAVENOUS | Status: DC | PRN
  Administered 2017-03-05: 10 mL via INTRAVENOUS
  Filled 2017-03-05: qty 10

## 2017-03-05 NOTE — Progress Notes (Signed)
Patient here today for follow up.  Pt c/o decreased ability to "move her hands"

## 2017-03-05 NOTE — Patient Instructions (Signed)
Abemaciclib tablets What is this medicine? ABEMACICLIB (a BEM a SYE klib) is a medicine that targets proteins in cancer cells and stops the cancer cells from growing. It is used to treat breast cancer. This medicine may be used for other purposes; ask your health care provider or pharmacist if you have questions. COMMON BRAND NAME(S): VERZENIO What should I tell my health care provider before I take this medicine? They need to know if you have any of these conditions: -diarrhea -history of blood clots -infection (especially a virus infection such as chickenpox, cold sores, or herpes) -liver disease -low blood counts, like low white cell, platelet, or red cell counts -an unusual or allergic reaction to abemaciclib, other medicines, foods, dyes, or preservatives -pregnant or trying to get pregnant -breast-feeding How should I use this medicine? Take this medicine by mouth with a glass of water. Follow the directions on the prescription label. Do not cut, crush or chew this medicine. Do not take with grapefruit juice. Take your medicine at regular intervals. Do not take it more often than directed. Do not stop taking except on your doctor's advice. Talk to your pediatrician regarding the use of this medicine in children. Special care may be needed. Overdosage: If you think you have taken too much of this medicine contact a poison control center or emergency room at once. NOTE: This medicine is only for you. Do not share this medicine with others. What if I miss a dose? If you miss a dose or vomit after taking a dose, do not take another dose. Take your next dose at your regular time. What may interact with this medicine? This medicine may interact with the following medications: -clarithromycin -itraconazole -ketoconazole -grapefruit juice -rifampin This list may not describe all possible interactions. Give your health care provider a list of all the medicines, herbs, non-prescription  drugs, or dietary supplements you use. Also tell them if you smoke, drink alcohol, or use illegal drugs. Some items may interact with your medicine. What should I watch for while using this medicine? Tell your doctor or healthcare professional if your symptoms do not start to get better or if they get worse. You may need blood work done while you are taking this medicine. Your mouth may get dry. Chewing sugarless gum or sucking hard candy, and drinking plenty of water may help. Contact your doctor if the problem does not go away or is severe. Do not become pregnant while taking this medicine or for at least 3 weeks after stopping it. Women should inform their doctor if they wish to become pregnant or think they might be pregnant. There is a potential for serious side effects to an unborn child. Men should inform their doctors if they wish to father a child later. This medicine may lower sperm counts. Talk to your health care professional or pharmacist for more information. Do not breast-feed an infant while taking this medicine or for 3 weeks after the last dose. Avoid taking products that contain aspirin, acetaminophen, ibuprofen, naproxen, or ketoprofen unless instructed by your doctor. These medicines may hide a fever. Call your doctor or health care professional for advice if you get a fever, chills or sore throat, or other symptoms of a cold or flu. Do not treat yourself. This drug decreases your body's ability to fight infections. Try to avoid being around people who are sick. This medicine may increase your risk to bruise or bleed. Call your doctor or health care professional if you notice any  unusual bleeding. This drug may make you feel generally unwell. This is not uncommon, as chemotherapy can affect healthy cells as well as cancer cells. Report any side effects. Continue your course of treatment even though you feel ill unless your doctor tells you to stop. What side effects may I notice from  receiving this medicine? Side effects that you should report to your doctor or health care professional as soon as possible: -allergic reactions like skin rash, itching or hives, swelling of the face, lips, or tongue -diarrhea -low blood counts - this medicine may decrease the number of white blood cells, red blood cells and platelets. You may be at increased risk for infections and bleeding. -signs and symptoms of a blood clot such as breathing problems; changes in vision; chest pain; severe, sudden headache; pain, swelling, warmth in the leg; trouble speaking; sudden numbness or weakness of the face, arm or leg -signs and symptoms of infection like fever or chills; cough; sore throat; pain or trouble passing urine -signs and symptoms of liver injury like dark yellow or brown urine; general ill feeling or flu-like symptoms; light-colored stools; loss of appetite; nausea; right upper belly pain; unusually weak or tired; yellowing of the eyes or skin Side effects that usually do not require medical attention (report these to your doctor or health care professional if they continue or are bothersome): -changes in taste -constipation -cough -dizziness -dry mouth -headache -loss of appetite -mouth sores -nausea, vomiting -red spots on the skin -stomach pain -swelling of the ankles, feet, hands -unusually weak or tired -weight loss This list may not describe all possible side effects. Call your doctor for medical advice about side effects. You may report side effects to FDA at 1-800-FDA-1088. Where should I keep my medicine? Keep out of the reach of children. Store between 20 and 25 degrees C (68 and 77 degrees F). Throw away any unused medicine after the expiration date. NOTE: This sheet is a summary. It may not cover all possible information. If you have questions about this medicine, talk to your doctor, pharmacist, or health care provider.  2018 [employer] Standard (2016-06-26  16:59:17)

## 2017-03-05 NOTE — Telephone Encounter (Signed)
Contacted [facility] - to f/u on pt's verenzio.  phone call:  [phone]; (pt's case ref number: [id number].)  Per [facility]- pts account has been closed. There is a note on the chart that prior auth approval for verenzio was obtained. I explained to [facility] rep that our office never received an official notice from [facility] that drug was approved. There was a note on the chart that [facility] reached out to the patient last week but the receptionist was unable to confirm why the account was closed.  She explained that this may have been due to the cost of the drug. She was unable to tell from her computer screen the "exact reason pt's account was closed."  She gave me the phone number of [phone] to call accounts receivable. I called and had to leave a vm at 955 am.  Pt to come in clinic today for scheduled apt with Dr. Janese Banks. RN Will discuss the Verenzio copayment further with patient at that time.

## 2017-03-05 NOTE — Telephone Encounter (Signed)
RN Spoke with Sarah Horne - [phone] at [facility]. [facility] reached out to pt several times. Representative spoke with pt this week. Her balance is $188 - There is copay assistance available.  Pt will have 0 copay for the first 3 months. Then on the 4'th month- her copay would be $10/month from that point forward. The max amount the copay card would allow would be up to $2500 assistance.  Pt refused copay assistance and said that she would like to talk to the doctor first before accepting this medication. States that she didn't know about the plan to start this new drug. pt chooses she could contact 1 [phone] (ref number - [id number]) back to confirm shipment and agreeable to copay card.  Dr. Janese Horne spoke to patient in office today. Pt agreeable to start Abemaciclib . Pt provided with pt education materials on new drug today in clinic. RN contacted [facility] back with the pt present. She gave [facility] verbal consent to proceed with processing the shipment and copay assistance.    RN also spoke with Daniel-oncology specialty pharmacist at [facility] to give him a v/o from Dr. Janese Horne on dosing changes of the Abemaciclib to 150 mg once daily. original rx write for twice daily). Pharmacist made note on chart- that this dose reduction was due to elevated LFts.

## 2017-03-05 NOTE — Telephone Encounter (Signed)
Returned phone call - to Quillian Quince in pharmacy. RN spoke with Lanelle Bal. - pharmacy wanted permission to send new pt starter kit for Verenzio to pt, which includes otc imodium AD.  I told her this is fine, as this would be protocol. Pt already is aware that she needs imodium AD. She stated that she had to have our office permission before sending the starter kit.

## 2017-03-05 NOTE — Progress Notes (Signed)
Patient provided with pt education on Verenzio. Consent for Verenzio signed in office today. RN and pt Spoke with [facility] while in the office to arrange for copayment assistance and drug shipment. Rn also spoke with pharmacist- Quillian Quince at [facility] regarding dose reduction changes- (see RN phone note on chart.)  Rn also discussed pt's neuropathic pain. Pt reports dropping objects frequently. She reports "burning pain in her right palm and finger tips and bilaterally feet).  Stated that she tried gabapentin 100 mg 1 tablet 2 to 3 times a day and lyrica 75 mg daily  in the past (a year ago). Pt took herself off both of these medications in the past and stated that "they never worked for me. Didn't do anything to help my neuropathic pain. Could something more be done?"  Rn spoke with Dr. Janese Banks. MD would like pt to use Lyrica 75 mg 1 tablet twice daily.  Pt instructed to contact our office back if neuropathic pain symptoms do not improve within a week of starting the lyrica. She gave verbal understanding.

## 2017-03-06 LAB — CANCER ANTIGEN 15-3: CAN 15 3: 49.1 U/mL — AB (ref 0.0–25.0)

## 2017-03-06 LAB — CANCER ANTIGEN 27.29: CA 27.29: 63.9 U/mL — AB (ref 0.0–38.6)

## 2017-03-07 ENCOUNTER — Encounter: Payer: Self-pay | Admitting: Oncology

## 2017-03-07 NOTE — Progress Notes (Addendum)
Hematology/Oncology Consult note [facility]  [phone] [phone]  Patient Care Team: [name], MD as PCP - General (Family Medicine) Bary Castilla, Forest Gleason, MD (General Surgery) [name], MD (Inactive) as Attending Physician (Internal Medicine)   Name of the patient: Sarah Horne  [id number]  15-May-1956   Date of visit: 03/07/17  Diagnosis- metastatic breast cancer ER positive with liver mets  Chief complaint/ Reason for visit- discuss USG results and further management  Heme/Onc history:  Oncology History   # LEFT BREAST IDC; STAGE II [cT2N1] ER >90%; PR- 50-90%; her 2 NEU- POS; s/p Neoadj chemo; AUG 2016- TCH+P s/p Lumpec & partial ALND- path CR; s/p RT [finished Nov 2016];  adj Herceptin; HELD for Jan 19th 2017 [in DEC 2016-EF dropped from 63 to 51%; FEB 27th EF-42.9%]; JAN 2016 START Arimidex; May 2017- EF- 60%; May 24th 2017-Re-start Herceptin q 3W; July 28th STOP herceptin [finished 40m/m2; sec to Low EF; however 2017 Oct EF= 67%; improved]  # DEC 4th 2017- Start Neratinib 4 pills; DEC 11th 3 pills; STOPPED.  # FEB 2018- RECURRENCE ER/PR positive; Her 2 NEGATIVE [liver Bx]  # MARCH 1st 2018- Tax-Cytoxan  # FEB 2018- Brain mets [SBRT; Dr.Crystal; finished Feb 28th 2018]  # March 2018- Eribulin  # PN- G-2; may 2017- Cymbalta 625md;MARCH 2018-  acute vascular insuff of BIL LE [? Taxotere vasoconstriction on asprin]; # hemorragic shock LGIB [s/p colo; Dr.Wohl]    # April 2016- Liver Bx- NEG;   # Drop in EF from Herceptin [recovered OCt 27th- EF 65%]  # BMD- jan 2017- osteopenia     Carcinoma of upper-outer quadrant of left breast in female, estrogen receptor positive ([place]  12/27/2014 Initial Diagnosis    Breast cancer of upper-outer quadrant of left female breast ([place]       Interval history- patient lives alone and has no significant support. She does have persistent right foot drop. Also  reports that her left hand is getting weasker and she is unable to hold on to things the way she did before. She feels weak and does not have much of an appetite  ECOG PS- 2-3 Pain scale- 4   Review of systems- Review of Systems  Constitutional: Positive for malaise/fatigue. Negative for chills, fever and weight loss.  HENT: Negative for congestion, ear discharge and nosebleeds.   Eyes: Negative for blurred vision.  Respiratory: Negative for cough, hemoptysis, sputum production, shortness of breath and wheezing.   Cardiovascular: Negative for chest pain, palpitations, orthopnea and claudication.  Gastrointestinal: Negative for abdominal pain, blood in stool, constipation, diarrhea, heartburn, melena, nausea and vomiting.  Genitourinary: Negative for dysuria, flank pain, frequency, hematuria and urgency.  Musculoskeletal: Negative for back pain, joint pain and myalgias.  Skin: Negative for rash.  Neurological: Positive for tingling, focal weakness and weakness. Negative for dizziness, seizures and headaches.  Endo/Heme/Allergies: Does not bruise/bleed easily.  Psychiatric/Behavioral: Negative for depression and suicidal ideas. The patient does not have insomnia.        Allergies  Allergen Reactions  . No Known Allergies      Past Medical History:  Diagnosis Date  . Chemotherapy-induced peripheral neuropathy ([place]  . Epistaxis    a. 11/2016 in setting of asa/plavix-->silver nitrate cauterization.  . GI bleed    a. 11/2016 Admission w/ GIB and hypovolemic shock req 3u PRBC's;  b. 11/2016 ECG: gastritis & nonbleeding peptic ulcer; c. 11/2016 Conlonoscopy: rectal and sigmoid colonic  ulcers.  . Heart attack ([place])    a. 1998 Cath @ [facility]: reportedly no intervention required.  Marland Kitchen Herceptin-induced cardiomyopathy ([place])    a. In the setting of Herceptin Rx for breast cancer (initiated 12/2014); b. 03/2015 MUGA EF 64%; b. 08/2015 MUGA: EF 51%; c. 10/2015 MUGA: EF 44%; d. 11/2015 Echo: EF 45-50%; e.  01/2016 MUGA: EF 60%; f. 06/2016 MUGA EF 65%; g. 10/2016 MUGA: EF 61%;  h. 12/2016 Echo: EF 55-60%, gr1 DD.  Marland Kitchen Hyperlipidemia   . Hypertension   . Neuropathy   . Possible PAD (peripheral artery disease) ([place])    a. 11/2016 LE cyanosis and weak pulses-->CTA w/o significant Ao-BiFem dzs. ? distal dzs-->ASA/Plavix initiated by vascular surgery.  Marland Kitchen PSVT (paroxysmal supraventricular tachycardia) ([place])    a. Dx 11/2016.  . Pulmonary embolism ([place])    a. 12/2016 CTA Chest: small nonocclusive PE in inferior segment of the Left lingula, somewhat eccentric filling defect suggesting chronic rather than acute embolic event; b. 09/7614 LE U/S:  No DVT; c. 12/2016 Echo: Nl RV fxn, nl PASP.  Marland Kitchen Recurrent Metastatic breast cancer ([place])    a. Dx 2016: Stage II, ER positive, PR positive, HER-2/neu overexpressing of the left breast-->chemo/radiation; b. 10/2016 CT Abd/pelvis: diffuse liver mets, ill defined sclerotic bone lesions-T12;  c. 10/2016 MRI brain: metastatic lesion along L temporal lobe (19x45m) w/ extensive surrounding edema & 546mmidline shift to right.  . Sinus tachycardia      Past Surgical History:  Procedure Laterality Date  . BREAST BIOPSY Left 2016   Positive  . BREAST LUMPECTOMY WITH SENTINEL LYMPH NODE BIOPSY Left 05/23/2015   Procedure: LEFT BREAST WIDE EXCISION WITH AXILLARY DISSECTION, MASTOPLASTY ;  Surgeon: JeRobert BellowMD;  Location: [facility] ORS;  Service: General;  Laterality: Left;  . BREAST SURGERY Left 12/18/14   breast biopsy/INVASIVE DUCTAL CARCINOMA OF BREAST, NOTTINGHAM GRADE 2.  . Marland KitchenREAST SURGERY  05/23/2015.   Wide excision/mastoplasty, axillary dissection. No residual invasive cancer, positive for residual DCIS. 0/2 nodes identified on axillary dissection. (no SLN by technetium or methylene blue)  . CARDIAC CATHETERIZATION    . COLONOSCOPY WITH PROPOFOL N/A 12/10/2016   Procedure: COLONOSCOPY WITH PROPOFOL;  Surgeon: DaLucilla LameMD;  Location: [facility] ENDOSCOPY;  Service: Endoscopy;   Laterality: N/A;  . COLONOSCOPY WITH PROPOFOL N/A 02/13/2017   Procedure: COLONOSCOPY WITH PROPOFOL;  Surgeon: WoLucilla LameMD;  [facility];  Service: Endoscopy;  Laterality: N/A;  . ESOPHAGOGASTRODUODENOSCOPY (EGD) WITH PROPOFOL N/A 12/08/2016   Procedure: ESOPHAGOGASTRODUODENOSCOPY (EGD) WITH PROPOFOL;  Surgeon: DaLucilla LameMD;  Location: [facility] ENDOSCOPY;  Service: Endoscopy;  Laterality: N/A;  . IVC FILTER INSERTION N/A 02/15/2017   Procedure: IVC Filter Insertion;  Surgeon: DeAlgernon HuxleyMD;  [facility];  Service: Cardiovascular;  Laterality: N/A;  . PORTACATH PLACEMENT Right 12-31-14   Dr ByBary Castilla  Social History   Social History  . Marital status: Divorced    Spouse name: N/A  . Number of children: N/A  . Years of education: N/A   Occupational History  . Not on file.   Social History Main Topics  . Smoking status: Light Tobacco Smoker    Packs/day: 0.50    Years: 18.00    Types: Cigarettes  . Smokeless tobacco: Never Used  . Alcohol use No  . Drug use: No  . Sexual activity: Not on file   Other Topics Concern  . Not on file   Social History Narrative  Lives in [place] by herself.    Family History  Problem Relation Age of Onset  . Breast cancer Maternal Aunt   . Breast cancer Cousin   . Brain cancer Maternal Uncle   . Diabetes Mother   . Hypertension Mother   . Stroke Mother      Current Outpatient Prescriptions:  .  anastrozole (ARIMIDEX) 1 MG tablet, TAKE 1 TABLET DAILY, Disp: 90 tablet, Rfl: 3 .  diphenoxylate-atropine (LOMOTIL) 2.5-0.025 MG tablet, Take 1 tablet by mouth 4 (four) times daily as needed for diarrhea or loose stools. Take it along with immodium, Disp: 60 tablet, Rfl: 0 .  feeding supplement, ENSURE ENLIVE, (ENSURE ENLIVE) LIQD, Take 237 mLs by mouth 2 (two) times daily between meals., Disp: 237 mL, Rfl: 12 .  flecainide (TAMBOCOR) 50 MG tablet, Take 1 tablet (50 mg total) by mouth 2 (two) times daily.,  Disp: 60 tablet, Rfl: 1 .  nystatin (MYCOSTATIN) 100000 UNIT/ML suspension, Take 5 mLs (500,000 Units total) by mouth 4 (four) times daily., Disp: 60 mL, Rfl: 0 .  ondansetron (ZOFRAN) 8 MG tablet, Take 1 tablet (8 mg total) by mouth every 8 (eight) hours as needed for nausea or vomiting (start 3 days; after chemo)., Disp: 40 tablet, Rfl: 1 .  polyethylene glycol (MIRALAX / GLYCOLAX) packet, Take 17 g by mouth daily., Disp: 14 each, Rfl: 0 .  Abemaciclib 150 MG TABS, Take 150 mg by mouth daily., Disp: 30 tablet, Rfl: 6 .  pregabalin (LYRICA) 75 MG capsule, Take 1 capsule (75 mg total) by mouth 2 (two) times daily., Disp: 60 capsule, Rfl: 2 No current facility-administered medications for this visit.   Facility-Administered Medications Ordered in Other Visits:  .  0.9 %  sodium chloride infusion, , Intravenous, Continuous, Pandit, Sandeep, MD .  0.9 %  sodium chloride infusion, , Intravenous, Continuous, Finnegan, Kathlene November, MD, Last Rate: 999 mL/hr at 04/24/15 1520 .  heparin lock flush 100 unit/mL, 500 Units, Intravenous, Once, Berenzon, Dmitriy, MD .  sodium chloride 0.9 % injection 10 mL, 10 mL, Intravenous, PRN, Ma Hillock, Sandeep, MD, 10 mL at 04/04/15 1440 .  sodium chloride flush (NS) 0.9 % injection 10 mL, 10 mL, Intravenous, PRN, Berenzon, Dmitriy, MD .  Tbo-Filgrastim [facility]) injection 480 mcg, 480 mcg, Subcutaneous, Once, Cammie Sickle, MD  Physical exam:  Vitals:   03/05/17 1123  BP: 112/76  Pulse: 99  Temp: 97.8 F (36.6 C)  TempSrc: Tympanic  Weight: 154 lb 6 oz (70 kg)   Physical Exam  Constitutional: She is oriented to person, place, and time.  Thin woman sitting in a wheelchair  HENT:  Head: Normocephalic and atraumatic.  Eyes: EOM are normal. Pupils are equal, round, and reactive to light. Scleral icterus is present.  Neck: Normal range of motion.  Cardiovascular: Normal rate, regular rhythm and normal heart sounds.   Pulmonary/Chest: Effort normal and breath  sounds normal.  Abdominal: Soft. Bowel sounds are normal.  distended  Neurological: She is alert and oriented to person, place, and time.  Right foot drop. There is motor weakness strength in her right hand muscles worse than left  Skin: Skin is warm and dry.     CMP Latest Ref Rng & Units 03/05/2017  Glucose 65 - 99 mg/dL 104(H)  BUN 6 - 20 mg/dL 7  Creatinine 0.44 - 1.00 mg/dL 0.61  Sodium 135 - 145 mmol/L 132(L)  Potassium 3.5 - 5.1 mmol/L 2.5(LL)  Chloride 101 - 111 mmol/L 98(L)  CO2  22 - 32 mmol/L 27  Calcium 8.9 - 10.3 mg/dL 8.2(L)  Total Protein 6.5 - 8.1 g/dL 6.0(L)  Total Bilirubin 0.3 - 1.2 mg/dL 6.4(H)  Alkaline Phos 38 - 126 U/L 286(H)  AST 15 - 41 U/L 217(H)  ALT 14 - 54 U/L 93(H)   CBC Latest Ref Rng & Units 03/05/2017  WBC 3.6 - 11.0 K/uL 3.2(L)  Hemoglobin 12.0 - 16.0 g/dL 9.6(L)  Hematocrit 35.0 - 47.0 % 28.4(L)  Platelets 150 - 440 K/uL 192    No images are attached to the encounter.  Dg Lumbar Spine 2-3 Views  Result Date: 02/12/2017 CLINICAL DATA:  Right lower extremity weakness for several days EXAM: LUMBAR SPINE - 2-3 VIEW COMPARISON:  None. FINDINGS: Frontal, lateral, and spot lumbosacral lateral images were obtained. There are 5 non-rib-bearing lumbar type vertebral bodies. There is no fracture or spondylolisthesis. There is mild disc space narrowing at L4-5. The disc spaces appear unremarkable. No erosive change. There is aortic atherosclerosis. Calcification in the pelvis is felt to represent calcified uterine leiomyomas. IMPRESSION: No fracture or spondylolisthesis. Disc space narrowing L4-5. Other disc spaces appear unremarkable. Calcified uterine leiomyomas.  Aortic atherosclerosis. Electronically Signed   By: Lowella Grip III M.D.   On: 02/12/2017 12:07   Mr Brain Wo Contrast  Result Date: 02/12/2017 CLINICAL DATA:  Right lower extremity weakness. History of metastatic breast cancer. Follow-up. EXAM: MRI HEAD WITHOUT CONTRAST TECHNIQUE:  Multiplanar, multiecho pulse sequences of the brain and surrounding structures were obtained without intravenous contrast. COMPARISON:  11/10/2016 FINDINGS: Brain: Today's study was ordered and performed without contrast. Previously, the patient had an 18 x 19 mm left temporal metastasis with pronounced vasogenic edema. On today's study, the edema is completely resolved. There is a focus of T2 and FLAIR signal remaining in the left temporal lobe measuring 8 x 10 mm in diameter which could represent post treatment gliosis. Cannot completely rule out some residual viable tumor in this location, but this may simply be post treatment gliosis. No new or second lesion is identified on this noncontrast study. The brain is otherwise normal. No hydrocephalus or extra-axial collection. No midline shift. Vascular: Major vessels at the base of the brain show flow. Skull and upper cervical spine: Negative Sinuses/Orbits: Clear/normal Other: None IMPRESSION: Positive response to treatment when compared to the study of February. Today's study is done without contrast. There is a residual 8 x 10 mm focus of T2 and FLAIR signal in the left temporal lobe that may simply be post treatment gliosis. There is no residual edema. Cannot completely rule out residual viable tumor, and further follow-up is warranted. No new lesion is seen. Electronically Signed   By: Nelson Chimes M.D.   On: 02/12/2017 10:38   US Abdomen Limited Ruq  Result Date: 02/19/2017 CLINICAL DATA:  Elevated LFTs. EXAM: US ABDOMEN LIMITED - RIGHT UPPER QUADRANT COMPARISON:  10/26/2016 FINDINGS: Gallbladder: No cholelithiasis. Gallbladder sludge. Mild gallbladder wall thickening measuring 3.6 mm. Negative sonographic Murphy sign. Common bile duct: Diameter: 3.2 mm Liver: Multiple hepatic masses throughout the liver with the largest in the right hepatic lobe measuring 3.9 x 3.2 x 3.6 cm. Lobulated contour. Small perihepatic ascites. IMPRESSION: Hepatic metastatic  disease with small amount of perihepatic ascites. Electronically Signed   By: Kathreen Devoid   On: 02/19/2017 10:21     Assessment and plan- Patient is a 61 y.o. female with metastatic ER positive breast cancer with liver mets  I discussed the results of usg abdomen  with the patient which shows multiple liver mets. There was noevidence of ductal dilataion or focal obstruction noted. However her bilirubin has been steadily increasing over last 3-4 weeks. This is likely from her metastatic breast cancer and I doubt this would be amenable to stenting. However I will obtain MRI with MRCP abdomen to r/o any obsttruction to see if there would be any role for ERCP or PTCA.  Given her worsening liver functions- her treatment options are limited. She has tolerated chemotherapy poorly in the past with complications and hospitalizations. abemaciclib has been approved. However given her worsening liver functions- I would like her to start on 150 mg once daily instead of twice daily. We will repeat her cbc, cmp 1 week after starting the drug and I will see her back in 2 weeks. Teaching for abemaciclib given. She understands the risks and benefits of the drug including all but not limited to fatigue, pancytopenia, diarrhea, abnormal LFT's.   Referral to palliative care services to be made today  Chemo induced peripheral neuropathy- I will hold off on starting drugs like duloxetine given her abnormal LFT's. I will start her on lyrica 75 mg BID  right hand weakness- recent MRI brain did not show any worsening mets. Will hold off on repeat scan for now  Hypokalemia- will give IV K today and she will restart taking PO potassium at home   Visit Diagnosis 1. Carcinoma of upper-outer quadrant of left breast in female, estrogen receptor positive ([place])   2. Abnormal liver enzymes   3. Chemotherapy-induced neuropathy ([place])   4. Metastatic breast cancer ([place])   5. Goals of care, counseling/discussion   6. Hypokalemia        Dr. Randa Evens, MD, MPH [facility] Pager- [phone] 03/07/2017 8:58 AM

## 2017-03-08 ENCOUNTER — Other Ambulatory Visit: Payer: Self-pay

## 2017-03-08 ENCOUNTER — Ambulatory Visit (INDEPENDENT_AMBULATORY_CARE_PROVIDER_SITE_OTHER): Payer: BLUE CROSS/BLUE SHIELD | Admitting: Gastroenterology

## 2017-03-08 ENCOUNTER — Encounter: Payer: Self-pay | Admitting: Gastroenterology

## 2017-03-08 ENCOUNTER — Other Ambulatory Visit
Admission: RE | Admit: 2017-03-08 | Discharge: 2017-03-08 | Disposition: A | Discharge status: Home / Self Care | Payer: BLUE CROSS/BLUE SHIELD | Source: Ambulatory Visit | Attending: Gastroenterology | Admitting: Gastroenterology

## 2017-03-08 VITALS — BP 128/83 | HR 119 | Temp 98.7°F | Ht 66.0 in | Wt 162.0 lb

## 2017-03-08 DIAGNOSIS — K922 Gastrointestinal hemorrhage, unspecified: Secondary | ICD-10-CM

## 2017-03-08 DIAGNOSIS — K625 Hemorrhage of anus and rectum: Secondary | ICD-10-CM | POA: Insufficient documentation

## 2017-03-08 LAB — CBC WITH DIFFERENTIAL/PLATELET
BASOS ABS: 0 10*3/uL (ref 0–0.1)
BASOS PCT: 1 %
EOS ABS: 0 10*3/uL (ref 0–0.7)
EOS PCT: 1 %
HCT: 29.5 % — ABNORMAL LOW (ref 35.0–47.0)
Hemoglobin: 9.6 g/dL — ABNORMAL LOW (ref 12.0–16.0)
Lymphocytes Relative: 17 %
Lymphs Abs: 0.6 10*3/uL — ABNORMAL LOW (ref 1.0–3.6)
MCH: 29.5 pg (ref 26.0–34.0)
MCHC: 32.5 g/dL (ref 32.0–36.0)
MCV: 90.7 fL (ref 80.0–100.0)
MONO ABS: 0.4 10*3/uL (ref 0.2–0.9)
Monocytes Relative: 10 %
Neutro Abs: 2.5 10*3/uL (ref 1.4–6.5)
Neutrophils Relative %: 71 %
PLATELETS: 224 10*3/uL (ref 150–440)
RBC: 3.25 MIL/uL — AB (ref 3.80–5.20)
RDW: 22.9 % — AB (ref 11.5–14.5)
WBC: 3.5 10*3/uL — AB (ref 3.6–11.0)

## 2017-03-08 LAB — TYPE AND SCREEN
ABO/RH(D): A POS
Antibody Screen: NEGATIVE

## 2017-03-08 MED ORDER — POTASSIUM CHLORIDE CRYS ER 20 MEQ PO TBCR: 20.0000 meq | EXTENDED_RELEASE_TABLET | Freq: Every day | ORAL | 3 refills | Status: DC

## 2017-03-08 MED ORDER — MAGNESIUM OXIDE 400 MG PO TABS: 400.0000 mg | ORAL_TABLET | Freq: Two times a day (BID) | ORAL | 6 refills | Status: DC

## 2017-03-08 NOTE — Progress Notes (Signed)
Primary Care Physician: [name], MD  Primary Gastroenterologist:  Dr. Lucilla Lame  Chief Complaint  Patient presents with  . lower gi bleed    2 week follow up had colonoscopy 02/13/17 no bleeding experienced since    HPI: Sarah Horne is a 61 y.o. female here follow-up after being discharged from the hospital with anemia. The patient had multiple sites of bleeding treated with coagulation.  The patient has a history of metastatic breast cancer with multiple lesions in her liver.  The patient has had abnormal liver enzymes.  The patient denies any black stools or bloody stools recently.  She also denies any abdominal present time.  Current Outpatient Prescriptions  Medication Sig Dispense Refill  . Abemaciclib 150 MG TABS Take 150 mg by mouth daily. 30 tablet 6  . anastrozole (ARIMIDEX) 1 MG tablet TAKE 1 TABLET DAILY 90 tablet 3  . diphenoxylate-atropine (LOMOTIL) 2.5-0.025 MG tablet Take 1 tablet by mouth 4 (four) times daily as needed for diarrhea or loose stools. Take it along with immodium 60 tablet 0  . feeding supplement, ENSURE ENLIVE, (ENSURE ENLIVE) LIQD Take 237 mLs by mouth 2 (two) times daily between meals. 237 mL 12  . magnesium oxide (MAG-OX) 400 MG tablet Take 1 tablet (400 mg total) by mouth 2 (two) times daily. 60 tablet 6  . nystatin (MYCOSTATIN) 100000 UNIT/ML suspension Take 5 mLs (500,000 Units total) by mouth 4 (four) times daily. 60 mL 0  . ondansetron (ZOFRAN) 8 MG tablet Take 1 tablet (8 mg total) by mouth every 8 (eight) hours as needed for nausea or vomiting (start 3 days; after chemo). 40 tablet 1  . polyethylene glycol (MIRALAX / GLYCOLAX) packet Take 17 g by mouth daily. 14 each 0  . potassium chloride SA (K-DUR,KLOR-CON) 20 MEQ tablet Take 1 tablet (20 mEq total) by mouth daily. 90 tablet 3  . pregabalin (LYRICA) 75 MG capsule Take 1 capsule (75 mg total) by mouth 2 (two) times daily. 60 capsule 2  . flecainide (TAMBOCOR) 50 MG tablet Take 1  tablet (50 mg total) by mouth 2 (two) times daily. (Patient not taking: Reported on 03/08/2017) 60 tablet 1   No current facility-administered medications for this visit.    Facility-Administered Medications Ordered in Other Visits  Medication Dose Route Frequency Provider Last Rate Last Dose  . 0.9 %  sodium chloride infusion   Intravenous Continuous Pandit, Sandeep, MD      . 0.9 %  sodium chloride infusion   Intravenous Continuous Lloyd Huger, MD 999 mL/hr at 04/24/15 1520    . heparin lock flush 100 unit/mL  500 Units Intravenous Once Berenzon, Dmitriy, MD      . sodium chloride 0.9 % injection 10 mL  10 mL Intravenous PRN [name], MD   10 mL at 04/04/15 1440  . sodium chloride flush (NS) 0.9 % injection 10 mL  10 mL Intravenous PRN Berenzon, Dmitriy, MD      . Tbo-Filgrastim [facility]) injection 480 mcg  480 mcg Subcutaneous Once Cammie Sickle, MD        Allergies as of 03/08/2017 - Review Complete 03/08/2017  Allergen Reaction Noted  . No known allergies  01/17/2015    ROS:  General: Negative for anorexia, weight loss, fever, chills, fatigue, weakness. ENT: Negative for hoarseness, difficulty swallowing , nasal congestion. CV: Negative for chest pain, angina, palpitations, dyspnea on exertion, peripheral edema.  Respiratory: Negative for dyspnea at rest, dyspnea on  exertion, cough, sputum, wheezing.  GI: See history of present illness. GU:  Negative for dysuria, hematuria, urinary incontinence, urinary frequency, nocturnal urination.  Endo: Negative for unusual weight change.    Physical Examination:   BP 128/83   Pulse (!) 119   Temp 98.7 F (37.1 C) (Oral)   Ht 5\' 6"  (1.676 m)   Wt 162 lb (73.5 kg)   SpO2 97%   BMI 26.15 kg/m   General: Well-nourished, well-developed in no acute distress.  Eyes: Positive icterus. Conjunctivae Jaundice. Mouth: Oropharyngeal mucosa moist and pink , no lesions erythema or exudate. Lungs: Clear to auscultation  bilaterally. Non-labored. Heart: Regular rate and rhythm, no murmurs rubs or gallops.  Abdomen: Bowel sounds are normal, nontender, nondistended, no hepatosplenomegaly or masses, no abdominal bruits or hernia , no rebound or guarding.   Extremities: No lower extremity edema. No clubbing or deformities. Neuro: Alert and oriented x 3.  Grossly intact. Skin: Warm and dry, Positive jaundice.   Psych: Alert and cooperative, normal mood and affect.  Labs:    Imaging Studies: Dg Lumbar Spine 2-3 Views  Result Date: 02/12/2017 CLINICAL DATA:  Right lower extremity weakness for several days EXAM: LUMBAR SPINE - 2-3 VIEW COMPARISON:  None. FINDINGS: Frontal, lateral, and spot lumbosacral lateral images were obtained. There are 5 non-rib-bearing lumbar type vertebral bodies. There is no fracture or spondylolisthesis. There is mild disc space narrowing at L4-5. The disc spaces appear unremarkable. No erosive change. There is aortic atherosclerosis. Calcification in the pelvis is felt to represent calcified uterine leiomyomas. IMPRESSION: No fracture or spondylolisthesis. Disc space narrowing L4-5. Other disc spaces appear unremarkable. Calcified uterine leiomyomas.  Aortic atherosclerosis. Electronically Signed   By: Lowella Grip III M.D.   On: 02/12/2017 12:07   Mr Brain Wo Contrast  Result Date: 02/12/2017 CLINICAL DATA:  Right lower extremity weakness. History of metastatic breast cancer. Follow-up. EXAM: MRI HEAD WITHOUT CONTRAST TECHNIQUE: Multiplanar, multiecho pulse sequences of the brain and surrounding structures were obtained without intravenous contrast. COMPARISON:  11/10/2016 FINDINGS: Brain: Today's study was ordered and performed without contrast. Previously, the patient had an 18 x 19 mm left temporal metastasis with pronounced vasogenic edema. On today's study, the edema is completely resolved. There is a focus of T2 and FLAIR signal remaining in the left temporal lobe measuring 8 x 10 mm  in diameter which could represent post treatment gliosis. Cannot completely rule out some residual viable tumor in this location, but this may simply be post treatment gliosis. No new or second lesion is identified on this noncontrast study. The brain is otherwise normal. No hydrocephalus or extra-axial collection. No midline shift. Vascular: Major vessels at the base of the brain show flow. Skull and upper cervical spine: Negative Sinuses/Orbits: Clear/normal Other: None IMPRESSION: Positive response to treatment when compared to the study of February. Today's study is done without contrast. There is a residual 8 x 10 mm focus of T2 and FLAIR signal in the left temporal lobe that may simply be post treatment gliosis. There is no residual edema. Cannot completely rule out residual viable tumor, and further follow-up is warranted. No new lesion is seen. Electronically Signed   By: Nelson Chimes M.D.   On: 02/12/2017 10:38   US Abdomen Limited Ruq  Result Date: 02/19/2017 CLINICAL DATA:  Elevated LFTs. EXAM: US ABDOMEN LIMITED - RIGHT UPPER QUADRANT COMPARISON:  10/26/2016 FINDINGS: Gallbladder: No cholelithiasis. Gallbladder sludge. Mild gallbladder wall thickening measuring 3.6 mm. Negative sonographic Murphy sign. Common  bile duct: Diameter: 3.2 mm Liver: Multiple hepatic masses throughout the liver with the largest in the right hepatic lobe measuring 3.9 x 3.2 x 3.6 cm. Lobulated contour. Small perihepatic ascites. IMPRESSION: Hepatic metastatic disease with small amount of perihepatic ascites. Electronically Signed   By: Kathreen Devoid   On: 02/19/2017 10:21    Assessment and Plan:   Sarah Horne is a 61 y.o. y/o female was discharge from the hospital with GI bleeding.  The patient has seen no further GI bleeding.  The patient will have a CBC today.  The patient is already set up for an MRI to see if there is any ductal dilatation whereby a ERCP may be needed to drain the liver if there is any ductal  dilatation.  The patient has been explained the plan and agrees with it.    Lucilla Lame, MD. Marval Regal   Note: This dictation was prepared with Dragon dictation along with smaller phrase technology. Any transcriptional errors that result from this process are unintentional.

## 2017-03-10 ENCOUNTER — Other Ambulatory Visit: Payer: Self-pay

## 2017-03-10 ENCOUNTER — Other Ambulatory Visit: Payer: Self-pay | Admitting: Oncology

## 2017-03-10 DIAGNOSIS — R748 Abnormal levels of other serum enzymes: Secondary | ICD-10-CM

## 2017-03-10 NOTE — Telephone Encounter (Signed)
No answer. Left message to call back to discuss which pharmacy to send rx to. Looks like it was sent to [facility] on 03/08/17.

## 2017-03-11 ENCOUNTER — Ambulatory Visit: Payer: BLUE CROSS/BLUE SHIELD | Admitting: Oncology

## 2017-03-11 ENCOUNTER — Ambulatory Visit
Admission: RE | Admit: 2017-03-11 | Discharge: 2017-03-11 | Disposition: A | Discharge status: Home / Self Care | Payer: BLUE CROSS/BLUE SHIELD | Source: Ambulatory Visit | Attending: Oncology | Admitting: Oncology

## 2017-03-11 ENCOUNTER — Other Ambulatory Visit: Payer: BLUE CROSS/BLUE SHIELD

## 2017-03-11 ENCOUNTER — Ambulatory Visit: Payer: BLUE CROSS/BLUE SHIELD

## 2017-03-11 DIAGNOSIS — R188 Other ascites: Secondary | ICD-10-CM | POA: Diagnosis not present

## 2017-03-11 DIAGNOSIS — G620 Drug-induced polyneuropathy: Secondary | ICD-10-CM

## 2017-03-11 DIAGNOSIS — I700 Atherosclerosis of aorta: Secondary | ICD-10-CM | POA: Insufficient documentation

## 2017-03-11 DIAGNOSIS — K769 Liver disease, unspecified: Secondary | ICD-10-CM | POA: Insufficient documentation

## 2017-03-11 DIAGNOSIS — R7989 Other specified abnormal findings of blood chemistry: Secondary | ICD-10-CM | POA: Diagnosis not present

## 2017-03-11 DIAGNOSIS — C50412 Malignant neoplasm of upper-outer quadrant of left female breast: Secondary | ICD-10-CM

## 2017-03-11 DIAGNOSIS — Z170 Estrogen receptor positive status [ER+]: Secondary | ICD-10-CM | POA: Diagnosis present

## 2017-03-11 DIAGNOSIS — J90 Pleural effusion, not elsewhere classified: Secondary | ICD-10-CM | POA: Insufficient documentation

## 2017-03-11 DIAGNOSIS — R748 Abnormal levels of other serum enzymes: Secondary | ICD-10-CM

## 2017-03-11 DIAGNOSIS — T451X5A Adverse effect of antineoplastic and immunosuppressive drugs, initial encounter: Secondary | ICD-10-CM

## 2017-03-11 MED ORDER — GADOBENATE DIMEGLUMINE 529 MG/ML IV SOLN
15.0000 mL | Freq: Once | INTRAVENOUS | Status: AC | PRN
  Administered 2017-03-11: 15 mL via INTRAVENOUS

## 2017-03-11 MED ORDER — MAGNESIUM OXIDE 400 MG PO TABS: 400.0000 mg | ORAL_TABLET | Freq: Two times a day (BID) | ORAL | 6 refills | Status: DC

## 2017-03-11 NOTE — Telephone Encounter (Signed)
Spoke with patient and she [facility] Rx sent to patient's pharmacy.

## 2017-03-12 ENCOUNTER — Telehealth: Payer: Self-pay | Admitting: *Deleted

## 2017-03-12 DIAGNOSIS — C50919 Malignant neoplasm of unspecified site of unspecified female breast: Secondary | ICD-10-CM

## 2017-03-12 DIAGNOSIS — R180 Malignant ascites: Secondary | ICD-10-CM

## 2017-03-12 NOTE — Telephone Encounter (Signed)
Called pt and Dr. Janese Banks wanted me to talk to her about MRCP results.  The scan showed that the mets in liver are the same as last scan. She does not have an obstruction or stones in the dcuts.  She does have a significant amount of fluid in her tummy. Dr. Janese Banks wanted to know if she was interested in getting fluid removed from her. She states yes. She feels like she is pregnant, her stomach is so bad. I told her that I will send a paper req. To have the fluid removed and will let her know when I get a date scheduled.

## 2017-03-15 ENCOUNTER — Telehealth: Payer: Self-pay | Admitting: *Deleted

## 2017-03-15 NOTE — Discharge Instructions (Signed)
Paracentesis, Care After °Refer to this sheet in the next few weeks. These instructions provide you with information about caring for yourself after your procedure. Your health care provider may also give you more specific instructions. Your treatment has been planned according to current medical practices, but problems sometimes occur. Call your health care provider if you have any problems or questions after your procedure. °What can I expect after the procedure? °After your procedure, it is common to have a small amount of clear fluid coming from the puncture site. °Follow these instructions at home: °· Return to your normal activities as told by your health care provider. Ask your health care provider what activities are safe for you. °· Take over-the-counter and prescription medicines only as told by your health care provider. °· Do not take baths, swim, or use a hot tub until your health care provider approves. °· Follow instructions from your health care provider about: °? How to take care of your puncture site. °? When and how you should change your bandage (dressing). °? When you should remove your dressing. °· Check your puncture area every day signs of infection. Watch for: °? Redness, swelling, or pain. °? Fluid, blood, or pus. °· Keep all follow-up visits as told by your health care provider. This is important. °Contact a health care provider if: °· You have redness, swelling, or pain at your puncture site. °· You start to have more clear fluid coming from your puncture site. °· You have blood or pus coming from your puncture site. °· You have chills. °· You have a fever. °Get help right away if: °· You develop chest pain or shortness of breath. °· You develop increasing pain, discomfort, or swelling in your abdomen. °· You feel dizzy or light-headed or you pass out. °This information is not intended to replace advice given to you by your health care provider. Make sure you discuss any questions you  have with your health care provider. °Document Released: 01/29/2015 Document Revised: 02/20/2016 Document Reviewed: 11/27/2014 °Elsevier Interactive Patient Education © 2018 Elsevier Inc. ° °

## 2017-03-15 NOTE — Telephone Encounter (Signed)
Called md and left message that the u/s guided paracentesis is scheduled for 6/21 for 2 pm arrival for 2:30 procedure.  She can eat and drink and take any meds she has to take. She should have a driver and I asked her to call me back to make sure she understands instructions and that she got my message and that date will work for her.

## 2017-03-16 ENCOUNTER — Other Ambulatory Visit: Payer: Self-pay | Admitting: *Deleted

## 2017-03-16 ENCOUNTER — Encounter: Payer: Self-pay | Admitting: Oncology

## 2017-03-16 ENCOUNTER — Inpatient Hospital Stay: Payer: BLUE CROSS/BLUE SHIELD

## 2017-03-16 ENCOUNTER — Inpatient Hospital Stay (HOSPITAL_BASED_OUTPATIENT_CLINIC_OR_DEPARTMENT_OTHER): Payer: BLUE CROSS/BLUE SHIELD | Admitting: Oncology

## 2017-03-16 VITALS — BP 126/84 | HR 96 | Temp 96.0°F | Resp 18 | Wt 171.5 lb

## 2017-03-16 DIAGNOSIS — R531 Weakness: Secondary | ICD-10-CM | POA: Diagnosis not present

## 2017-03-16 DIAGNOSIS — Z79811 Long term (current) use of aromatase inhibitors: Secondary | ICD-10-CM

## 2017-03-16 DIAGNOSIS — I471 Supraventricular tachycardia: Secondary | ICD-10-CM

## 2017-03-16 DIAGNOSIS — J90 Pleural effusion, not elsewhere classified: Secondary | ICD-10-CM | POA: Diagnosis not present

## 2017-03-16 DIAGNOSIS — Z79899 Other long term (current) drug therapy: Secondary | ICD-10-CM

## 2017-03-16 DIAGNOSIS — G620 Drug-induced polyneuropathy: Secondary | ICD-10-CM | POA: Diagnosis not present

## 2017-03-16 DIAGNOSIS — R000 Tachycardia, unspecified: Secondary | ICD-10-CM

## 2017-03-16 DIAGNOSIS — Z79818 Long term (current) use of other agents affecting estrogen receptors and estrogen levels: Secondary | ICD-10-CM | POA: Diagnosis not present

## 2017-03-16 DIAGNOSIS — I252 Old myocardial infarction: Secondary | ICD-10-CM | POA: Diagnosis not present

## 2017-03-16 DIAGNOSIS — R7989 Other specified abnormal findings of blood chemistry: Secondary | ICD-10-CM | POA: Diagnosis not present

## 2017-03-16 DIAGNOSIS — R180 Malignant ascites: Secondary | ICD-10-CM | POA: Diagnosis not present

## 2017-03-16 DIAGNOSIS — C50919 Malignant neoplasm of unspecified site of unspecified female breast: Secondary | ICD-10-CM

## 2017-03-16 DIAGNOSIS — C50412 Malignant neoplasm of upper-outer quadrant of left female breast: Secondary | ICD-10-CM

## 2017-03-16 DIAGNOSIS — I10 Essential (primary) hypertension: Secondary | ICD-10-CM | POA: Diagnosis not present

## 2017-03-16 DIAGNOSIS — E785 Hyperlipidemia, unspecified: Secondary | ICD-10-CM

## 2017-03-16 DIAGNOSIS — R630 Anorexia: Secondary | ICD-10-CM | POA: Diagnosis not present

## 2017-03-16 DIAGNOSIS — F17210 Nicotine dependence, cigarettes, uncomplicated: Secondary | ICD-10-CM

## 2017-03-16 DIAGNOSIS — Z803 Family history of malignant neoplasm of breast: Secondary | ICD-10-CM

## 2017-03-16 DIAGNOSIS — Z170 Estrogen receptor positive status [ER+]: Secondary | ICD-10-CM

## 2017-03-16 DIAGNOSIS — R748 Abnormal levels of other serum enzymes: Secondary | ICD-10-CM

## 2017-03-16 DIAGNOSIS — E876 Hypokalemia: Secondary | ICD-10-CM

## 2017-03-16 DIAGNOSIS — T451X5A Adverse effect of antineoplastic and immunosuppressive drugs, initial encounter: Secondary | ICD-10-CM

## 2017-03-16 DIAGNOSIS — M21371 Foot drop, right foot: Secondary | ICD-10-CM

## 2017-03-16 DIAGNOSIS — M8580 Other specified disorders of bone density and structure, unspecified site: Secondary | ICD-10-CM | POA: Diagnosis not present

## 2017-03-16 DIAGNOSIS — I700 Atherosclerosis of aorta: Secondary | ICD-10-CM

## 2017-03-16 DIAGNOSIS — E860 Dehydration: Secondary | ICD-10-CM

## 2017-03-16 DIAGNOSIS — Z86711 Personal history of pulmonary embolism: Secondary | ICD-10-CM

## 2017-03-16 LAB — PROTIME-INR
INR: 1.11
Prothrombin Time: 14.3 seconds (ref 11.4–15.2)

## 2017-03-16 LAB — MAGNESIUM: Magnesium: 1.7 mg/dL (ref 1.7–2.4)

## 2017-03-16 LAB — COMPREHENSIVE METABOLIC PANEL
ALK PHOS: 274 U/L — AB (ref 38–126)
ALT: 68 U/L — AB (ref 14–54)
AST: 182 U/L — AB (ref 15–41)
Albumin: 1.9 g/dL — ABNORMAL LOW (ref 3.5–5.0)
Anion gap: 6 (ref 5–15)
BUN: 8 mg/dL (ref 6–20)
CALCIUM: 7.9 mg/dL — AB (ref 8.9–10.3)
CO2: 25 mmol/L (ref 22–32)
CREATININE: 0.83 mg/dL (ref 0.44–1.00)
Chloride: 102 mmol/L (ref 101–111)
GFR calc non Af Amer: >60 mL/min (ref >60)
GLUCOSE: 94 mg/dL (ref 65–99)
Potassium: 3.8 mmol/L (ref 3.5–5.1)
SODIUM: 133 mmol/L — AB (ref 135–145)
Total Bilirubin: 4.5 mg/dL — ABNORMAL HIGH (ref 0.3–1.2)
Total Protein: 6.8 g/dL (ref 6.5–8.1)

## 2017-03-16 LAB — CBC WITH DIFFERENTIAL/PLATELET
Basophils Absolute: 0 10*3/uL (ref 0–0.1)
Basophils Relative: 0 %
EOS ABS: 0 10*3/uL (ref 0–0.7)
EOS PCT: 1 %
HCT: 28.7 % — ABNORMAL LOW (ref 35.0–47.0)
Hemoglobin: 9.6 g/dL — ABNORMAL LOW (ref 12.0–16.0)
LYMPHS ABS: 0.7 10*3/uL — AB (ref 1.0–3.6)
LYMPHS PCT: 25 %
MCH: 30.2 pg (ref 26.0–34.0)
MCHC: 33.3 g/dL (ref 32.0–36.0)
MCV: 90.7 fL (ref 80.0–100.0)
MONO ABS: 0.1 10*3/uL — AB (ref 0.2–0.9)
Monocytes Relative: 3 %
Neutro Abs: 2 10*3/uL (ref 1.4–6.5)
Neutrophils Relative %: 71 %
PLATELETS: 204 10*3/uL (ref 150–440)
RBC: 3.16 MIL/uL — ABNORMAL LOW (ref 3.80–5.20)
RDW: 22.1 % — ABNORMAL HIGH (ref 11.5–14.5)
WBC: 2.8 10*3/uL — ABNORMAL LOW (ref 3.6–11.0)

## 2017-03-16 MED ORDER — OXYCODONE HCL 5 MG PO TABS: 5.0000 mg | ORAL_TABLET | Freq: Three times a day (TID) | ORAL | 0 refills | Status: DC | PRN

## 2017-03-16 NOTE — Progress Notes (Signed)
Here for follow up

## 2017-03-16 NOTE — Progress Notes (Signed)
Hematology/Oncology Consult note [facility]  [phone] [phone]  Patient Care Team: [name], MD as PCP - General (Family Medicine) Bary Castilla, Forest Gleason, MD (General Surgery) [name], MD (Inactive) as Attending Physician (Internal Medicine)   Name of the patient: Sarah Horne  [id number]  [birth year]   Date of visit: 03/16/17  Diagnosis- metastatic breast cancer ER positive with liver mets   Chief complaint/ Reason for visit- discuss mri results and blodowork  Heme/Onc history:  Oncology History   # LEFT BREAST IDC; STAGE II [cT2N1] ER >90%; PR- 50-90%; her 2 NEU- POS; s/p Neoadj chemo; AUG 2016- TCH+P s/p Lumpec & partial ALND- path CR; s/p RT [finished Nov 2016];  adj Herceptin; HELD for Jan 19th 2017 [in DEC 2016-EF dropped from 63 to 51%; FEB 27th EF-42.9%]; JAN 2016 START Arimidex; May 2017- EF- 60%; May 24th 2017-Re-start Herceptin q 3W; July 28th STOP herceptin [finished 36m/m2; sec to Low EF; however 2017 Oct EF= 67%; improved]  # DEC 4th 2017- Start Neratinib 4 pills; DEC 11th 3 pills; STOPPED.  # FEB 2018- RECURRENCE ER/PR positive; Her 2 NEGATIVE [liver Bx]  # MARCH 1st 2018- Tax-Cytoxan  # FEB 2018- Brain mets [SBRT; Dr.Crystal; finished Feb 28th 2018]  # March 2018- Eribulin  # PN- G-2; may 2017- Cymbalta 626md;MARCH 2018-  acute vascular insuff of BIL LE [? Taxotere vasoconstriction on asprin]; # hemorragic shock LGIB [s/p colo; Dr.Wohl]    # April 2016- Liver Bx- NEG;   # Drop in EF from Herceptin [recovered OCt 27th- EF 65%]  # BMD- jan 2017- osteopenia     Carcinoma of upper-outer quadrant of left breast in female, estrogen receptor positive ([facility]  12/27/2014 Initial Diagnosis    Breast cancer of upper-outer quadrant of left female breast ([facility]     abemaciclib started on 03/09/17  Interval history- Patient is tolerating her medication well and reports no significant side effect  from it. She does feel that weakness in her b/l hands is slowly getting worse. She is unable to hold on to things and manage her ADL's efficiently. Continues to live alone  ECOG PS- 2-3 Pain scale- 7 Opioid associated constipation- no  Review of systems- Review of Systems  Constitutional: Positive for malaise/fatigue.  Neurological: Positive for tingling and sensory change.       B/l hand weakness      Allergies  Allergen Reactions  . No Known Allergies      Past Medical History:  Diagnosis Date  . Chemotherapy-induced peripheral neuropathy ([place]  . Epistaxis    a. 11/2016 in setting of asa/plavix-->silver nitrate cauterization.  . GI bleed    a. 11/2016 Admission w/ GIB and hypovolemic shock req 3u PRBC's;  b. 11/2016 ECG: gastritis & nonbleeding peptic ulcer; c. 11/2016 Conlonoscopy: rectal and sigmoid colonic ulcers.  . Heart attack ([facility]   a. 1998 Cath @ [facility]: reportedly no intervention required.  . Marland Kitchenerceptin-induced cardiomyopathy ([facility]   a. In the setting of Herceptin Rx for breast cancer (initiated 12/2014); b. 03/2015 MUGA EF 64%; b. 08/2015 MUGA: EF 51%; c. 10/2015 MUGA: EF 44%; d. 11/2015 Echo: EF 45-50%; e. 01/2016 MUGA: EF 60%; f. 06/2016 MUGA EF 65%; g. 10/2016 MUGA: EF 61%;  h. 12/2016 Echo: EF 55-60%, gr1 DD.  . Marland Kitchenyperlipidemia   . Hypertension   . Neuropathy   . Possible PAD (peripheral artery disease) ([facility]   a. 11/2016 LE cyanosis  and weak pulses-->CTA w/o significant Ao-BiFem dzs. ? distal dzs-->ASA/Plavix initiated by vascular surgery.  Marland Kitchen PSVT (paroxysmal supraventricular tachycardia) ([place])    a. Dx 11/2016.  . Pulmonary embolism ([place])    a. 12/2016 CTA Chest: small nonocclusive PE in inferior segment of the Left lingula, somewhat eccentric filling defect suggesting chronic rather than acute embolic event; b. 05/3569 LE U/S:  No DVT; c. 12/2016 Echo: Nl RV fxn, nl PASP.  Marland Kitchen Recurrent Metastatic breast cancer ([place])    a. Dx 2016: Stage II, ER positive, PR positive,  HER-2/neu overexpressing of the left breast-->chemo/radiation; b. 10/2016 CT Abd/pelvis: diffuse liver mets, ill defined sclerotic bone lesions-T12;  c. 10/2016 MRI brain: metastatic lesion along L temporal lobe (19x71m) w/ extensive surrounding edema & 557mmidline shift to right.  . Sinus tachycardia      Past Surgical History:  Procedure Laterality Date  . BREAST BIOPSY Left 2016   Positive  . BREAST LUMPECTOMY WITH SENTINEL LYMPH NODE BIOPSY Left 05/23/2015   Procedure: LEFT BREAST WIDE EXCISION WITH AXILLARY DISSECTION, MASTOPLASTY ;  Surgeon: JeRobert BellowMD;  Location: [facility] ORS;  Service: General;  Laterality: Left;  . BREAST SURGERY Left 12/18/14   breast biopsy/INVASIVE DUCTAL CARCINOMA OF BREAST, NOTTINGHAM GRADE 2.  . Marland KitchenREAST SURGERY  05/23/2015.   Wide excision/mastoplasty, axillary dissection. No residual invasive cancer, positive for residual DCIS. 0/2 nodes identified on axillary dissection. (no SLN by technetium or methylene blue)  . CARDIAC CATHETERIZATION    . COLONOSCOPY WITH PROPOFOL N/A 12/10/2016   Procedure: COLONOSCOPY WITH PROPOFOL;  Surgeon: DaLucilla LameMD;  Location: [facility] ENDOSCOPY;  Service: Endoscopy;  Laterality: N/A;  . COLONOSCOPY WITH PROPOFOL N/A 02/13/2017   Procedure: COLONOSCOPY WITH PROPOFOL;  Surgeon: WoLucilla LameMD;  [facility];  Service: Endoscopy;  Laterality: N/A;  . ESOPHAGOGASTRODUODENOSCOPY (EGD) WITH PROPOFOL N/A 12/08/2016   Procedure: ESOPHAGOGASTRODUODENOSCOPY (EGD) WITH PROPOFOL;  Surgeon: DaLucilla LameMD;  Location: [facility] ENDOSCOPY;  Service: Endoscopy;  Laterality: N/A;  . IVC FILTER INSERTION N/A 02/15/2017   Procedure: IVC Filter Insertion;  Surgeon: DeAlgernon HuxleyMD;  [place];  Service: Cardiovascular;  Laterality: N/A;  . PORTACATH PLACEMENT Right 12-31-14   Dr ByBary Castilla  Social History   Social History  . Marital status: Divorced    Spouse name: N/A  . Number of children: N/A  . Years of  education: N/A   Occupational History  . Not on file.   Social History Main Topics  . Smoking status: Light Tobacco Smoker    Packs/day: 0.50    Years: 18.00    Types: Cigarettes  . Smokeless tobacco: Never Used  . Alcohol use No  . Drug use: No  . Sexual activity: Not on file   Other Topics Concern  . Not on file   Social History Narrative   [place]    Family History  Problem Relation Age of Onset  . Breast cancer Maternal Aunt   . Breast cancer Cousin   . Brain cancer Maternal Uncle   . Diabetes Mother   . Hypertension Mother   . Stroke Mother      Current Outpatient Prescriptions:  .  Abemaciclib 150 MG TABS, Take 150 mg by mouth daily., Disp: 30 tablet, Rfl: 6 .  anastrozole (ARIMIDEX) 1 MG tablet, TAKE 1 TABLET DAILY, Disp: 90 tablet, Rfl: 3 .  feeding supplement, ENSURE ENLIVE, (ENSURE ENLIVE) LIQD, Take 237 mLs by mouth 2 (two)  times daily between meals., Disp: 237 mL, Rfl: 12 .  flecainide (TAMBOCOR) 50 MG tablet, Take 1 tablet (50 mg total) by mouth 2 (two) times daily., Disp: 60 tablet, Rfl: 1 .  magnesium oxide (MAG-OX) 400 MG tablet, Take 1 tablet (400 mg total) by mouth 2 (two) times daily., Disp: 60 tablet, Rfl: 6 .  potassium chloride SA (K-DUR,KLOR-CON) 20 MEQ tablet, Take 1 tablet (20 mEq total) by mouth daily., Disp: 90 tablet, Rfl: 3 .  pregabalin (LYRICA) 75 MG capsule, Take 1 capsule (75 mg total) by mouth 2 (two) times daily., Disp: 60 capsule, Rfl: 2 .  diphenoxylate-atropine (LOMOTIL) 2.5-0.025 MG tablet, Take 1 tablet by mouth 4 (four) times daily as needed for diarrhea or loose stools. Take it along with immodium (Patient not taking: Reported on 03/16/2017), Disp: 60 tablet, Rfl: 0 .  nystatin (MYCOSTATIN) 100000 UNIT/ML suspension, Take 5 mLs (500,000 Units total) by mouth 4 (four) times daily. (Patient not taking: Reported on 03/16/2017), Disp: 60 mL, Rfl: 0 .  ondansetron (ZOFRAN) 8 MG tablet, Take 1 tablet (8 mg total) by  mouth every 8 (eight) hours as needed for nausea or vomiting (start 3 days; after chemo). (Patient not taking: Reported on 03/16/2017), Disp: 40 tablet, Rfl: 1 .  oxyCODONE (OXY IR/ROXICODONE) 5 MG immediate release tablet, Take 1 tablet (5 mg total) by mouth every 8 (eight) hours as needed for severe pain., Disp: 42 tablet, Rfl: 0 No current facility-administered medications for this visit.   Facility-Administered Medications Ordered in Other Visits:  .  0.9 %  sodium chloride infusion, , Intravenous, Continuous, Pandit, Sandeep, MD .  0.9 %  sodium chloride infusion, , Intravenous, Continuous, Finnegan, Kathlene November, MD, Last Rate: 999 mL/hr at 04/24/15 1520 .  heparin lock flush 100 unit/mL, 500 Units, Intravenous, Once, Berenzon, Dmitriy, MD .  sodium chloride 0.9 % injection 10 mL, 10 mL, Intravenous, PRN, Ma Hillock, Sandeep, MD, 10 mL at 04/04/15 1440 .  sodium chloride flush (NS) 0.9 % injection 10 mL, 10 mL, Intravenous, PRN, Berenzon, Dmitriy, MD .  Tbo-Filgrastim [facility]) injection 480 mcg, 480 mcg, Subcutaneous, Once, Cammie Sickle, MD  Physical exam:  Vitals:   03/16/17 1451  BP: 126/84  Pulse: 96  Resp: 18  Temp: (!) 96 F (35.6 C)  TempSrc: Tympanic  Weight: 171 lb 8 oz (77.8 kg)   Physical Exam  Constitutional: She is oriented to person, place, and time.  Thin and fatigued. In no acute distress  HENT:  Head: Normocephalic and atraumatic.  Eyes: EOM are normal. Pupils are equal, round, and reactive to light. Scleral icterus is present.  Neck: Normal range of motion.  Cardiovascular: Normal rate, regular rhythm and normal heart sounds.   Pulmonary/Chest: Effort normal and breath sounds normal.  Abdominal: Soft. Bowel sounds are normal. She exhibits distension.  Musculoskeletal: She exhibits edema.  Neurological: She is alert and oriented to person, place, and time.  Motor power in b/l hands is decreased.   Skin: Skin is warm and dry.     CMP Latest Ref Rng & Units  03/16/2017  Glucose 65 - 99 mg/dL 94  BUN 6 - 20 mg/dL 8  Creatinine 0.44 - 1.00 mg/dL 0.83  Sodium 135 - 145 mmol/L 133(L)  Potassium 3.5 - 5.1 mmol/L 3.8  Chloride 101 - 111 mmol/L 102  CO2 22 - 32 mmol/L 25  Calcium 8.9 - 10.3 mg/dL 7.9(L)  Total Protein 6.5 - 8.1 g/dL 6.8  Total Bilirubin 0.3 - 1.2  mg/dL 4.5(H)  Alkaline Phos 38 - 126 U/L 274(H)  AST 15 - 41 U/L 182(H)  ALT 14 - 54 U/L 68(H)   CBC Latest Ref Rng & Units 03/16/2017  WBC 3.6 - 11.0 K/uL 2.8(L)  Hemoglobin 12.0 - 16.0 g/dL 9.6(L)  Hematocrit 35.0 - 47.0 % 28.7(L)  Platelets 150 - 440 K/uL 204    No images are attached to the encounter.  Mr 3d Recon At Scanner  Result Date: 03/11/2017 CLINICAL DATA:  Left-sided breast cancer.  Elevated liver markers. EXAM: MRI ABDOMEN WITHOUT AND WITH CONTRAST (INCLUDING MRCP) TECHNIQUE: Multiplanar multisequence MR imaging of the abdomen was performed both before and after the administration of intravenous contrast. Heavily T2-weighted images of the biliary and pancreatic ducts were obtained, and three-dimensional MRCP images were rendered by post processing. CONTRAST:  24m MULTIHANCE GADOBENATE DIMEGLUMINE 529 MG/ML IV SOLN COMPARISON:  12/04/2016 abdominal CTA.  Chest CT of 01/12/2017. FINDINGS: Moderate to marked motion degradation throughout. Lower chest: Small right and small to moderate left-sided pleural effusion, new since the prior exam. Normal heart size. Hepatobiliary: "Pseudo cirrhosis" appearance of the liver is likely related to widespread metastatic disease and treatment effects. Difficult to quantify the extent of presumed diffuse hepatic metastasis, given motion and cross modality comparison. Hepatic dome lesion measures 3.1 cm on image 17/ series 19. Felt to be grossly similar to 2.9 cm on 12/04/2016. Normal gallbladder. No biliary duct dilatation. Favor artifactual foci of T2 hypointensity within the common duct on image 5/series 11. Pancreas:  Normal, without mass or  ductal dilatation. Spleen:  Normal in size, without focal abnormality. Adrenals/Urinary Tract: Normal adrenal glands. Normal kidneys, without hydronephrosis. Stomach/Bowel: Normal stomach and abdominal bowel loops. Vascular/Lymphatic: Aortic and branch vessel atherosclerosis. IVC filter. No retroperitoneal or retrocrural adenopathy. Other: Interval development of moderate to marked abdominal ascites. Peritoneal hyperenhancement is most apparent on image 21/series 25. Musculoskeletal: No gross focal osseous lesion identified. IMPRESSION: 1. Moderate to markedly motion degraded exam. 2. Suboptimal evaluation of the liver, with grossly similar diffuse metastatic disease. 3. No biliary duct dilatation or convincing evidence of choledocholithiasis. 4. Development of moderate to large volume ascites, likely malignant given peritoneal enhancement. 5. New bilateral pleural effusions, suggesting fluid overload. 6.  Aortic Atherosclerosis (ICD10-I70.0). Electronically Signed   By: KAbigail MiyamotoM.D.   On: 03/11/2017 11:28   Mr Abdomen Mrcp WMoise BoringContast  Result Date: 03/11/2017 CLINICAL DATA:  Left-sided breast cancer.  Elevated liver markers. EXAM: MRI ABDOMEN WITHOUT AND WITH CONTRAST (INCLUDING MRCP) TECHNIQUE: Multiplanar multisequence MR imaging of the abdomen was performed both before and after the administration of intravenous contrast. Heavily T2-weighted images of the biliary and pancreatic ducts were obtained, and three-dimensional MRCP images were rendered by post processing. CONTRAST:  126mMULTIHANCE GADOBENATE DIMEGLUMINE 529 MG/ML IV SOLN COMPARISON:  12/04/2016 abdominal CTA.  Chest CT of 01/12/2017. FINDINGS: Moderate to marked motion degradation throughout. Lower chest: Small right and small to moderate left-sided pleural effusion, new since the prior exam. Normal heart size. Hepatobiliary: "Pseudo cirrhosis" appearance of the liver is likely related to widespread metastatic disease and treatment effects.  Difficult to quantify the extent of presumed diffuse hepatic metastasis, given motion and cross modality comparison. Hepatic dome lesion measures 3.1 cm on image 17/ series 19. Felt to be grossly similar to 2.9 cm on 12/04/2016. Normal gallbladder. No biliary duct dilatation. Favor artifactual foci of T2 hypointensity within the common duct on image 5/series 11. Pancreas:  Normal, without mass or ductal dilatation. Spleen:  Normal in size, without focal abnormality. Adrenals/Urinary Tract: Normal adrenal glands. Normal kidneys, without hydronephrosis. Stomach/Bowel: Normal stomach and abdominal bowel loops. Vascular/Lymphatic: Aortic and branch vessel atherosclerosis. IVC filter. No retroperitoneal or retrocrural adenopathy. Other: Interval development of moderate to marked abdominal ascites. Peritoneal hyperenhancement is most apparent on image 21/series 25. Musculoskeletal: No gross focal osseous lesion identified. IMPRESSION: 1. Moderate to markedly motion degraded exam. 2. Suboptimal evaluation of the liver, with grossly similar diffuse metastatic disease. 3. No biliary duct dilatation or convincing evidence of choledocholithiasis. 4. Development of moderate to large volume ascites, likely malignant given peritoneal enhancement. 5. New bilateral pleural effusions, suggesting fluid overload. 6.  Aortic Atherosclerosis (ICD10-I70.0). Electronically Signed   By: Abigail Miyamoto M.D.   On: 03/11/2017 11:28   US Abdomen Limited Ruq  Result Date: 02/19/2017 CLINICAL DATA:  Elevated LFTs. EXAM: US ABDOMEN LIMITED - RIGHT UPPER QUADRANT COMPARISON:  10/26/2016 FINDINGS: Gallbladder: No cholelithiasis. Gallbladder sludge. Mild gallbladder wall thickening measuring 3.6 mm. Negative sonographic Murphy sign. Common bile duct: Diameter: 3.2 mm Liver: Multiple hepatic masses throughout the liver with the largest in the right hepatic lobe measuring 3.9 x 3.2 x 3.6 cm. Lobulated contour. Small perihepatic ascites. IMPRESSION:  Hepatic metastatic disease with small amount of perihepatic ascites. Electronically Signed   By: Kathreen Devoid   On: 02/19/2017 10:21     Assessment and plan- Patient is a 61 y.o. female with metastatic ER positive breast cancer and liver mets  1. Continue abemaciclib once daily. Her LFT's are trending down and her tumor markers have also improved. Discussed MRI abdomen findings which again shows diffuse liver mets. No focal obstruction that would be stentable. Repeat cbc, cmp, mg and tumor markers in 2 weeks and see Dr. Rogue Bussing. If liver functions continue to improve, abemaciclib can be increased to BID in 2 weeks  2. Malignant ascites- given significant discomfort and ascites noted on MRI, she will be getting therapeutic paracentesis in 2 days  3. B/l hand weakness- likely due to worsening chemo induced neuropathy- increase lyrica to 75 mg BID. I have also given prescription for oxycodone 5 mg PO Q8 prn for her pain  4. Hypokalemia- resolved- continue PO potassium  RTC in 2 weeks to see Dr. Rogue Bussing   Visit Diagnosis 1. Metastatic breast cancer ([place])   2. Chemotherapy-induced neuropathy ([place])      Dr. Randa Evens, MD, MPH [facility] Pager- [phone] 03/16/2017 4:40 PM

## 2017-03-18 ENCOUNTER — Ambulatory Visit
Admission: RE | Admit: 2017-03-18 | Discharge: 2017-03-18 | Disposition: A | Discharge status: Home / Self Care | Payer: BLUE CROSS/BLUE SHIELD | Source: Ambulatory Visit | Attending: Oncology | Admitting: Oncology

## 2017-03-18 ENCOUNTER — Ambulatory Visit: Payer: BLUE CROSS/BLUE SHIELD

## 2017-03-18 ENCOUNTER — Other Ambulatory Visit: Payer: BLUE CROSS/BLUE SHIELD

## 2017-03-18 DIAGNOSIS — R188 Other ascites: Secondary | ICD-10-CM | POA: Diagnosis not present

## 2017-03-18 DIAGNOSIS — C50919 Malignant neoplasm of unspecified site of unspecified female breast: Secondary | ICD-10-CM | POA: Diagnosis not present

## 2017-03-18 DIAGNOSIS — R180 Malignant ascites: Secondary | ICD-10-CM

## 2017-03-18 NOTE — Procedures (Signed)
US guided paracentesis.  Removed 3 liters of fluid.  Minimal blood loss and no immediate complication.    

## 2017-03-21 NOTE — Telephone Encounter (Signed)
x

## 2017-03-25 ENCOUNTER — Telehealth: Payer: Self-pay | Admitting: *Deleted

## 2017-03-25 MED ORDER — MAGNESIUM OXIDE 400 MG PO TABS: 400.0000 mg | ORAL_TABLET | Freq: Two times a day (BID) | ORAL | 6 refills | Status: DC

## 2017-03-25 NOTE — Telephone Encounter (Signed)
Received incoming call from patient requesting refill for magnesium.  Patient also requested that [facility] be removed from her pharmacy list. [facility] [place] [facility]

## 2017-03-26 ENCOUNTER — Other Ambulatory Visit: Payer: Self-pay | Admitting: *Deleted

## 2017-03-26 ENCOUNTER — Telehealth: Payer: Self-pay | Admitting: *Deleted

## 2017-03-26 DIAGNOSIS — C50919 Malignant neoplasm of unspecified site of unspecified female breast: Secondary | ICD-10-CM

## 2017-03-26 DIAGNOSIS — R180 Malignant ascites: Secondary | ICD-10-CM

## 2017-03-26 NOTE — Telephone Encounter (Signed)
Ed to request we arrange another paracentesis because the fluid has started building back up again. She had 3 L drained on 03/18/17

## 2017-03-26 NOTE — Telephone Encounter (Signed)
Spoke with Dr. Rogue Bussing - md approved paracentesis- Orders sent to [facility]. Scheduling.

## 2017-03-29 ENCOUNTER — Telehealth: Payer: Self-pay | Admitting: Cardiovascular Disease

## 2017-03-29 ENCOUNTER — Other Ambulatory Visit: Payer: Self-pay | Admitting: *Deleted

## 2017-03-29 DIAGNOSIS — C50312 Malignant neoplasm of lower-inner quadrant of left female breast: Secondary | ICD-10-CM

## 2017-03-29 MED ORDER — ANASTROZOLE 1 MG PO TABS: 1.0000 mg | ORAL_TABLET | Freq: Every day | ORAL | 3 refills | Status: DC

## 2017-03-29 NOTE — Telephone Encounter (Signed)
Refill Request Flecainide 50 mg tablet , advise if ok to refill?

## 2017-03-29 NOTE — Telephone Encounter (Signed)
°*  STAT* If patient is at the pharmacy, call can be transferred to refill team.   1. Which medications need to be refilled? (please list name of each medication and dose if known)  Flecainide   2. Which pharmacy/location (including street and city if local pharmacy) [facility] [place]  3. Do they need a 30 day or 90 day supply?  90 day

## 2017-03-29 NOTE — Telephone Encounter (Signed)
Per Dr Rogue Bussing, She needs to contact her cardiologist for the Flecainide refill. Patient informed and will contact Dr Harmon Dun

## 2017-03-29 NOTE — Telephone Encounter (Signed)
Called requesting a refill of Anastrozole and Flecainide. We did not order the Flecainide, does she need to contact her PCP for this? She was put on it in the hospital for irregular heart beat. Please advise

## 2017-03-29 NOTE — Telephone Encounter (Signed)
Yes that is fine

## 2017-03-30 ENCOUNTER — Other Ambulatory Visit: Payer: Self-pay

## 2017-03-30 ENCOUNTER — Encounter: Payer: Self-pay | Admitting: Gastroenterology

## 2017-03-30 ENCOUNTER — Ambulatory Visit
Admission: RE | Admit: 2017-03-30 | Discharge: 2017-03-30 | Disposition: A | Discharge status: Home / Self Care | Payer: BLUE CROSS/BLUE SHIELD | Source: Ambulatory Visit | Attending: Internal Medicine | Admitting: Internal Medicine

## 2017-03-30 DIAGNOSIS — C50919 Malignant neoplasm of unspecified site of unspecified female breast: Secondary | ICD-10-CM | POA: Diagnosis not present

## 2017-03-30 DIAGNOSIS — R180 Malignant ascites: Secondary | ICD-10-CM | POA: Insufficient documentation

## 2017-03-30 DIAGNOSIS — R188 Other ascites: Secondary | ICD-10-CM | POA: Diagnosis not present

## 2017-03-30 MED ORDER — FLECAINIDE ACETATE 50 MG PO TABS: 50.0000 mg | ORAL_TABLET | Freq: Two times a day (BID) | ORAL | 1 refills | Status: DC

## 2017-03-30 NOTE — Telephone Encounter (Signed)
Refill sent.    Requested Prescriptions   Signed Prescriptions Disp Refills  . flecainide (TAMBOCOR) 50 MG tablet 60 tablet 1    Sig: Take 1 tablet (50 mg total) by mouth 2 (two) times daily.    Authorizing Provider: Kathlyn [place] A    Ordering User: Janan Ridge

## 2017-03-30 NOTE — Telephone Encounter (Signed)
Requested Prescriptions   Signed Prescriptions Disp Refills  . flecainide (TAMBOCOR) 50 MG tablet 60 tablet 1    Sig: Take 1 tablet (50 mg total) by mouth 2 (two) times daily.    Authorizing Provider: Kathlyn [place] A    Ordering User: Janan Ridge

## 2017-04-01 ENCOUNTER — Ambulatory Visit: Admission: RE | Admit: 2017-04-01 | Payer: BLUE CROSS/BLUE SHIELD | Source: Ambulatory Visit

## 2017-04-02 ENCOUNTER — Inpatient Hospital Stay: Payer: BLUE CROSS/BLUE SHIELD

## 2017-04-02 ENCOUNTER — Inpatient Hospital Stay: Payer: BLUE CROSS/BLUE SHIELD | Attending: Internal Medicine | Admitting: Oncology

## 2017-04-02 ENCOUNTER — Inpatient Hospital Stay (HOSPITAL_BASED_OUTPATIENT_CLINIC_OR_DEPARTMENT_OTHER): Payer: BLUE CROSS/BLUE SHIELD | Admitting: Internal Medicine

## 2017-04-02 DIAGNOSIS — G620 Drug-induced polyneuropathy: Secondary | ICD-10-CM | POA: Insufficient documentation

## 2017-04-02 DIAGNOSIS — M8580 Other specified disorders of bone density and structure, unspecified site: Secondary | ICD-10-CM | POA: Diagnosis not present

## 2017-04-02 DIAGNOSIS — I471 Supraventricular tachycardia: Secondary | ICD-10-CM | POA: Diagnosis not present

## 2017-04-02 DIAGNOSIS — C7951 Secondary malignant neoplasm of bone: Secondary | ICD-10-CM | POA: Diagnosis not present

## 2017-04-02 DIAGNOSIS — C50412 Malignant neoplasm of upper-outer quadrant of left female breast: Secondary | ICD-10-CM | POA: Diagnosis not present

## 2017-04-02 DIAGNOSIS — D709 Neutropenia, unspecified: Secondary | ICD-10-CM | POA: Insufficient documentation

## 2017-04-02 DIAGNOSIS — Z9221 Personal history of antineoplastic chemotherapy: Secondary | ICD-10-CM | POA: Insufficient documentation

## 2017-04-02 DIAGNOSIS — R531 Weakness: Secondary | ICD-10-CM | POA: Diagnosis not present

## 2017-04-02 DIAGNOSIS — Z170 Estrogen receptor positive status [ER+]: Secondary | ICD-10-CM

## 2017-04-02 DIAGNOSIS — Z803 Family history of malignant neoplasm of breast: Secondary | ICD-10-CM | POA: Insufficient documentation

## 2017-04-02 DIAGNOSIS — E876 Hypokalemia: Secondary | ICD-10-CM | POA: Insufficient documentation

## 2017-04-02 DIAGNOSIS — K7290 Hepatic failure, unspecified without coma: Secondary | ICD-10-CM | POA: Insufficient documentation

## 2017-04-02 DIAGNOSIS — C787 Secondary malignant neoplasm of liver and intrahepatic bile duct: Secondary | ICD-10-CM | POA: Insufficient documentation

## 2017-04-02 DIAGNOSIS — F17210 Nicotine dependence, cigarettes, uncomplicated: Secondary | ICD-10-CM

## 2017-04-02 DIAGNOSIS — C7931 Secondary malignant neoplasm of brain: Secondary | ICD-10-CM | POA: Insufficient documentation

## 2017-04-02 DIAGNOSIS — R188 Other ascites: Secondary | ICD-10-CM

## 2017-04-02 DIAGNOSIS — D701 Agranulocytosis secondary to cancer chemotherapy: Secondary | ICD-10-CM | POA: Diagnosis not present

## 2017-04-02 DIAGNOSIS — Z79818 Long term (current) use of other agents affecting estrogen receptors and estrogen levels: Secondary | ICD-10-CM

## 2017-04-02 DIAGNOSIS — I10 Essential (primary) hypertension: Secondary | ICD-10-CM | POA: Insufficient documentation

## 2017-04-02 DIAGNOSIS — Z853 Personal history of malignant neoplasm of breast: Secondary | ICD-10-CM | POA: Insufficient documentation

## 2017-04-02 DIAGNOSIS — C50919 Malignant neoplasm of unspecified site of unspecified female breast: Secondary | ICD-10-CM

## 2017-04-02 DIAGNOSIS — G629 Polyneuropathy, unspecified: Secondary | ICD-10-CM | POA: Diagnosis not present

## 2017-04-02 DIAGNOSIS — M21371 Foot drop, right foot: Secondary | ICD-10-CM | POA: Diagnosis not present

## 2017-04-02 DIAGNOSIS — Z923 Personal history of irradiation: Secondary | ICD-10-CM | POA: Diagnosis not present

## 2017-04-02 DIAGNOSIS — E785 Hyperlipidemia, unspecified: Secondary | ICD-10-CM

## 2017-04-02 DIAGNOSIS — Z79899 Other long term (current) drug therapy: Secondary | ICD-10-CM | POA: Insufficient documentation

## 2017-04-02 DIAGNOSIS — T386X5S Adverse effect of antigonadotrophins, antiestrogens, antiandrogens, not elsewhere classified, sequela: Secondary | ICD-10-CM | POA: Insufficient documentation

## 2017-04-02 DIAGNOSIS — Z86711 Personal history of pulmonary embolism: Secondary | ICD-10-CM | POA: Insufficient documentation

## 2017-04-02 DIAGNOSIS — E860 Dehydration: Secondary | ICD-10-CM

## 2017-04-02 DIAGNOSIS — I252 Old myocardial infarction: Secondary | ICD-10-CM | POA: Insufficient documentation

## 2017-04-02 DIAGNOSIS — T451X5S Adverse effect of antineoplastic and immunosuppressive drugs, sequela: Secondary | ICD-10-CM | POA: Diagnosis not present

## 2017-04-02 DIAGNOSIS — Z808 Family history of malignant neoplasm of other organs or systems: Secondary | ICD-10-CM | POA: Insufficient documentation

## 2017-04-02 LAB — COMPREHENSIVE METABOLIC PANEL
ALK PHOS: 231 U/L — AB (ref 38–126)
ALT: 35 U/L (ref 14–54)
AST: 81 U/L — ABNORMAL HIGH (ref 15–41)
Albumin: 2 g/dL — ABNORMAL LOW (ref 3.5–5.0)
Anion gap: 6 (ref 5–15)
BILIRUBIN TOTAL: 2.1 mg/dL — AB (ref 0.3–1.2)
BUN: 11 mg/dL (ref 6–20)
CALCIUM: 8.2 mg/dL — AB (ref 8.9–10.3)
CO2: 25 mmol/L (ref 22–32)
Chloride: 102 mmol/L (ref 101–111)
Creatinine, Ser: 0.85 mg/dL (ref 0.44–1.00)
GLUCOSE: 100 mg/dL — AB (ref 65–99)
POTASSIUM: 3.4 mmol/L — AB (ref 3.5–5.1)
Sodium: 133 mmol/L — ABNORMAL LOW (ref 135–145)
TOTAL PROTEIN: 6.7 g/dL (ref 6.5–8.1)

## 2017-04-02 LAB — CBC WITH DIFFERENTIAL/PLATELET
Basophils Absolute: 0 10*3/uL (ref 0–0.1)
Basophils Relative: 1 %
Eosinophils Absolute: 0 10*3/uL (ref 0–0.7)
Eosinophils Relative: 1 %
HEMATOCRIT: 30.2 % — AB (ref 35.0–47.0)
HEMOGLOBIN: 9.9 g/dL — AB (ref 12.0–16.0)
LYMPHS ABS: 0.4 10*3/uL — AB (ref 1.0–3.6)
Lymphocytes Relative: 39 %
MCH: 32.1 pg (ref 26.0–34.0)
MCHC: 32.7 g/dL (ref 32.0–36.0)
MCV: 98.3 fL (ref 80.0–100.0)
MONO ABS: 0 10*3/uL — AB (ref 0.2–0.9)
MONOS PCT: 4 %
NEUTROS ABS: 0.5 10*3/uL — AB (ref 1.4–6.5)
NEUTROS PCT: 55 %
Platelets: 58 10*3/uL — ABNORMAL LOW (ref 150–440)
RBC: 3.07 MIL/uL — ABNORMAL LOW (ref 3.80–5.20)
RDW: 26.6 % — AB (ref 11.5–14.5)
WBC: 1 10*3/uL — CL (ref 3.6–11.0)

## 2017-04-02 MED ORDER — FULVESTRANT 250 MG/5ML IM SOLN
500.0000 mg | Freq: Once | INTRAMUSCULAR | Status: AC
  Administered 2017-04-02: 500 mg via INTRAMUSCULAR

## 2017-04-02 MED ORDER — HEPARIN SOD (PORK) LOCK FLUSH 100 UNIT/ML IV SOLN
500.0000 [IU] | Freq: Once | INTRAVENOUS | Status: AC
  Administered 2017-04-02: 500 [IU] via INTRAVENOUS

## 2017-04-02 NOTE — Progress Notes (Signed)
[place] OFFICE PROGRESS NOTE  Patient Care Team: [name], MD as PCP - General (Family Medicine) Bary Castilla, Forest Gleason, MD (General Surgery) [name], MD (Inactive) as Attending Physician (Internal Medicine)  No matching staging information was found for the patient.   Oncology History   # LEFT BREAST IDC; STAGE II [cT2N1] ER >90%; PR- 50-90%; her 2 NEU- POS; s/p Neoadj chemo; AUG 2016- TCH+P s/p Lumpec & partial ALND- path CR; s/p RT [finished Nov 2016];  adj Herceptin; HELD for Jan 19th 2017 [in DEC 2016-EF dropped from 63 to 51%; FEB 27th EF-42.9%]; JAN 2016 START Arimidex; May 2017- EF- 60%; May 24th 2017-Re-start Herceptin q 3W; July 28th STOP herceptin [finished 70m/m2; sec to Low EF; however 2017 Oct EF= 67%; improved]  # DEC 4th 2017- Start Neratinib 4 pills; DEC 11th 3 pills; STOPPED.  # FEB 2018- RECURRENCE ER/PR positive; Her 2 NEGATIVE [liver Bx]  # MARCH 1st 2018- Tax-Cytoxan  # FEB 2018- Brain mets [SBRT; Dr.Crystal; finished Feb 28th 2018]  # March 2018- Eribulin  # PN- G-2; may 2017- Cymbalta 624md;MARCH 2018-  acute vascular insuff of BIL LE [? Taxotere vasoconstriction on asprin]; # hemorragic shock LGIB [s/p colo; Dr.Wohl]    # April 2016- Liver Bx- NEG;   # Drop in EF from Herceptin [recovered OCt 27th- EF 65%]  # BMD- jan 2017- osteopenia     Carcinoma of upper-outer quadrant of left breast in female, estrogen receptor positive ([place]  12/27/2014 Initial Diagnosis    Breast cancer of upper-outer quadrant of left female breast ([facility]       INTERVAL HISTORY:  A very pleasant [age]ear old female patient with above history of Recurrent stage IV ER/PR positive HER-2/neu negative [status post liver biopsy February 2018] - With intolerance to multiple chemotherapies- is currently on Faslodex-Abemaciclib is here for follow-up. Patient is currently on one pill a day.   Patient has not had any recent hospitalizations. However  she had ascites tapped twice- most recently approximately 4 days ago 1.9 L.  Abdominal discomfort improved since paracentesis. Overall she continues to feel weak.  She continues to complain of moderately severe pain in her hand and feet from the neuropathy. Her Lyrica was increased; also recommend oxycodone. Patient currently lives at home by herself. She has been abe to do her chores by herself. She has been able to go around in the hospital walker.  Denies any nausea vomiting. Denies any abdominal pain. Otherwise denies any significant diarrhea. No falls.   In the interim she was evaluated by palliative care at home. Patient/brother concerned about palliative care evaluation.   REVIEW OF SYSTEMS:  A complete 10 point review of system is done which is negative except mentioned above/history of present illness.   PAST MEDICAL HISTORY :  Past Medical History:  Diagnosis Date  . Chemotherapy-induced peripheral neuropathy ([facility]  . Epistaxis    a. 11/2016 in setting of asa/plavix-->silver nitrate cauterization.  . GI bleed    a. 11/2016 Admission w/ GIB and hypovolemic shock req 3u PRBC's;  b. 11/2016 ECG: gastritis & nonbleeding peptic ulcer; c. 11/2016 Conlonoscopy: rectal and sigmoid colonic ulcers.  . Heart attack ([facility]   a. 1998 Cath @ [facility]: reportedly no intervention required.  . Marland Kitchenerceptin-induced cardiomyopathy ([facility]   a. In the setting of Herceptin Rx for breast cancer (initiated 12/2014); b. 03/2015 MUGA EF 64%; b. 08/2015 MUGA: EF 51%; c. 10/2015 MUGA: EF 44%; d. 11/2015  Echo: EF 45-50%; e. 01/2016 MUGA: EF 60%; f. 06/2016 MUGA EF 65%; g. 10/2016 MUGA: EF 61%;  h. 12/2016 Echo: EF 55-60%, gr1 DD.  Marland Kitchen Hyperlipidemia   . Hypertension   . Neuropathy   . Possible PAD (peripheral artery disease) ([place])    a. 11/2016 LE cyanosis and weak pulses-->CTA w/o significant Ao-BiFem dzs. ? distal dzs-->ASA/Plavix initiated by vascular surgery.  Marland Kitchen PSVT (paroxysmal supraventricular tachycardia) ([place])    a. Dx  11/2016.  . Pulmonary embolism ([place])    a. 12/2016 CTA Chest: small nonocclusive PE in inferior segment of the Left lingula, somewhat eccentric filling defect suggesting chronic rather than acute embolic event; b. 0/2542 LE U/S:  No DVT; c. 12/2016 Echo: Nl RV fxn, nl PASP.  Marland Kitchen Recurrent Metastatic breast cancer ([place])    a. Dx 2016: Stage II, ER positive, PR positive, HER-2/neu overexpressing of the left breast-->chemo/radiation; b. 10/2016 CT Abd/pelvis: diffuse liver mets, ill defined sclerotic bone lesions-T12;  c. 10/2016 MRI brain: metastatic lesion along L temporal lobe (19x62m) w/ extensive surrounding edema & 598mmidline shift to right.  . Sinus tachycardia     PAST SURGICAL HISTORY :   Past Surgical History:  Procedure Laterality Date  . BREAST BIOPSY Left 2016   Positive  . BREAST LUMPECTOMY WITH SENTINEL LYMPH NODE BIOPSY Left 05/23/2015   Procedure: LEFT BREAST WIDE EXCISION WITH AXILLARY DISSECTION, MASTOPLASTY ;  Surgeon: JeRobert BellowMD;  Location: [facility] ORS;  Service: General;  Laterality: Left;  . BREAST SURGERY Left 12/18/14   breast biopsy/INVASIVE DUCTAL CARCINOMA OF BREAST, NOTTINGHAM GRADE 2.  . Marland KitchenREAST SURGERY  05/23/2015.   Wide excision/mastoplasty, axillary dissection. No residual invasive cancer, positive for residual DCIS. 0/2 nodes identified on axillary dissection. (no SLN by technetium or methylene blue)  . CARDIAC CATHETERIZATION    . COLONOSCOPY WITH PROPOFOL N/A 12/10/2016   Procedure: COLONOSCOPY WITH PROPOFOL;  Surgeon: DaLucilla LameMD;  Location: [facility] ENDOSCOPY;  Service: Endoscopy;  Laterality: N/A;  . COLONOSCOPY WITH PROPOFOL N/A 02/13/2017   Procedure: COLONOSCOPY WITH PROPOFOL;  Surgeon: WoLucilla LameMD;  [facility];  Service: Endoscopy;  Laterality: N/A;  . ESOPHAGOGASTRODUODENOSCOPY (EGD) WITH PROPOFOL N/A 12/08/2016   Procedure: ESOPHAGOGASTRODUODENOSCOPY (EGD) WITH PROPOFOL;  Surgeon: DaLucilla LameMD;  Location: [facility] ENDOSCOPY;  Service:  Endoscopy;  Laterality: N/A;  . IVC FILTER INSERTION N/A 02/15/2017   Procedure: IVC Filter Insertion;  Surgeon: DeAlgernon HuxleyMD;  [facility];  Service: Cardiovascular;  Laterality: N/A;  . PORTACATH PLACEMENT Right 12-31-14   Dr ByBary Castilla  FAMILY HISTORY :   Family History  Problem Relation Age of Onset  . Breast cancer Maternal Aunt   . Breast cancer Cousin   . Brain cancer Maternal Uncle   . Diabetes Mother   . Hypertension Mother   . Stroke Mother     SOCIAL HISTORY:   Social History  Substance Use Topics  . Smoking status: Light Tobacco Smoker    Packs/day: 0.50    Years: 18.00    Types: Cigarettes  . Smokeless tobacco: Never Used  . Alcohol use No    ALLERGIES:  is allergic to no known allergies.  MEDICATIONS:  Current Outpatient Prescriptions  Medication Sig Dispense Refill  . Abemaciclib 150 MG TABS Take 150 mg by mouth daily. 30 tablet 6  . anastrozole (ARIMIDEX) 1 MG tablet Take 1 tablet (1 mg total) by mouth daily. 90 tablet 3  . diphenoxylate-atropine (LOMOTIL) 2.5-0.025 MG  tablet Take 1 tablet by mouth 4 (four) times daily as needed for diarrhea or loose stools. Take it along with immodium 60 tablet 0  . feeding supplement, ENSURE ENLIVE, (ENSURE ENLIVE) LIQD Take 237 mLs by mouth 2 (two) times daily between meals. 237 mL 12  . flecainide (TAMBOCOR) 50 MG tablet Take 1 tablet (50 mg total) by mouth 2 (two) times daily. 60 tablet 1  . magnesium oxide (MAG-OX) 400 MG tablet Take 1 tablet (400 mg total) by mouth 2 (two) times daily. 60 tablet 6  . nystatin (MYCOSTATIN) 100000 UNIT/ML suspension Take 5 mLs (500,000 Units total) by mouth 4 (four) times daily. 60 mL 0  . ondansetron (ZOFRAN) 8 MG tablet Take 1 tablet (8 mg total) by mouth every 8 (eight) hours as needed for nausea or vomiting (start 3 days; after chemo). 40 tablet 1  . oxyCODONE (OXY IR/ROXICODONE) 5 MG immediate release tablet Take 1 tablet (5 mg total) by mouth every 8 (eight)  hours as needed for severe pain. 42 tablet 0  . potassium chloride SA (K-DUR,KLOR-CON) 20 MEQ tablet Take 1 tablet (20 mEq total) by mouth daily. 90 tablet 3  . pregabalin (LYRICA) 75 MG capsule Take 1 capsule (75 mg total) by mouth 2 (two) times daily. 60 capsule 2   No current facility-administered medications for this visit.    Facility-Administered Medications Ordered in Other Visits  Medication Dose Route Frequency Provider Last Rate Last Dose  . 0.9 %  sodium chloride infusion   Intravenous Continuous Pandit, Sandeep, MD      . 0.9 %  sodium chloride infusion   Intravenous Continuous Lloyd Huger, MD 999 mL/hr at 04/24/15 1520    . heparin lock flush 100 unit/mL  500 Units Intravenous Once Berenzon, Dmitriy, MD      . sodium chloride 0.9 % injection 10 mL  10 mL Intravenous PRN [name], MD   10 mL at 04/04/15 1440  . sodium chloride flush (NS) 0.9 % injection 10 mL  10 mL Intravenous PRN Berenzon, Dmitriy, MD      . Tbo-Filgrastim (GRANIX) injection 480 mcg  480 mcg Subcutaneous Once Cammie Sickle, MD        PHYSICAL EXAMINATION: ECOG PERFORMANCE STATUS: 0 - Asymptomatic  BP 110/71 (BP Location: Right Arm, Patient Position: Sitting)   Pulse 89   Temp 97.8 F (36.6 C) (Tympanic)   Wt 142 lb (64.4 kg)   BMI 22.92 kg/m   Filed Weights   04/02/17 0849  Weight: 142 lb (64.4 kg)    GENERAL: Well-nourished well-developed; Alert, no distress and comfortable. Accompanied by her brother.   EYES: no pallor. Positive for icterus She is in a wheelchair  OROPHARYNX: No  ulceration; good dentition ; NECK: supple, no masses felt LYMPH:  no palpable lymphadenopathy in the cervical, axillary or inguinal regions LUNGS: clear to auscultation and  No wheeze or crackles HEART/CVS: regular rate & rhythm and no murmurs; NO bilateral lower extremity edema.  ABDOMEN:abdomen soft, non-tender and normal bowel sounds Musculoskeletal:no cyanosis of digits and no clubbing   PSYCH: alert & oriented x 3 with fluent speech NEURO: no focal motor/sensory deficits- except for foot drop on the right side.   LABORATORY DATA:  I have reviewed the data as listed    Component Value Date/Time   NA 133 (L) 04/02/2017 0815   NA 135 03/04/2017 0918   NA 135 01/08/2015 0850   K 3.4 (L) 04/02/2017 0815   K 3.7  01/08/2015 0850   CL 102 04/02/2017 0815   CL 101 01/08/2015 0850   CO2 25 04/02/2017 0815   CO2 26 01/08/2015 0850   GLUCOSE 100 (H) 04/02/2017 0815   GLUCOSE 161 (H) 01/08/2015 0850   BUN 11 04/02/2017 0815   BUN 8 03/04/2017 0918   BUN 17 01/08/2015 0850   CREATININE 0.85 04/02/2017 0815   CREATININE 0.82 01/22/2015 1559   CALCIUM 8.2 (L) 04/02/2017 0815   CALCIUM 9.3 01/08/2015 0850   PROT 6.7 04/02/2017 0815   PROT 6.4 03/04/2017 0918   PROT 6.8 01/22/2015 1559   ALBUMIN 2.0 (L) 04/02/2017 0815   ALBUMIN 2.5 (L) 03/04/2017 0918   ALBUMIN 3.9 01/22/2015 1559   AST 81 (H) 04/02/2017 0815   AST 34 01/22/2015 1559   ALT 35 04/02/2017 0815   ALT 42 01/22/2015 1559   ALKPHOS 231 (H) 04/02/2017 0815   ALKPHOS 157 (H) 01/22/2015 1559   BILITOT 2.1 (H) 04/02/2017 0815   BILITOT 6.2 (H) 03/04/2017 0918   BILITOT 0.2 (L) 01/22/2015 1559   GFRNONAA >60 04/02/2017 0815   GFRNONAA >60 01/22/2015 1559   GFRAA >60 04/02/2017 0815   GFRAA >60 01/22/2015 1559    No results found for: SPEP, UPEP  Lab Results  Component Value Date   WBC 1.0 (LL) 04/02/2017   NEUTROABS 0.5 (L) 04/02/2017   HGB 9.9 (L) 04/02/2017   HCT 30.2 (L) 04/02/2017   MCV 98.3 04/02/2017   PLT 58 (L) 04/02/2017      Chemistry      Component Value Date/Time   NA 133 (L) 04/02/2017 0815   NA 135 03/04/2017 0918   NA 135 01/08/2015 0850   K 3.4 (L) 04/02/2017 0815   K 3.7 01/08/2015 0850   CL 102 04/02/2017 0815   CL 101 01/08/2015 0850   CO2 25 04/02/2017 0815   CO2 26 01/08/2015 0850   BUN 11 04/02/2017 0815   BUN 8 03/04/2017 0918   BUN 17 01/08/2015 0850    CREATININE 0.85 04/02/2017 0815   CREATININE 0.82 01/22/2015 1559      Component Value Date/Time   CALCIUM 8.2 (L) 04/02/2017 0815   CALCIUM 9.3 01/08/2015 0850   ALKPHOS 231 (H) 04/02/2017 0815   ALKPHOS 157 (H) 01/22/2015 1559   AST 81 (H) 04/02/2017 0815   AST 34 01/22/2015 1559   ALT 35 04/02/2017 0815   ALT 42 01/22/2015 1559   BILITOT 2.1 (H) 04/02/2017 0815   BILITOT 6.2 (H) 03/04/2017 0918   BILITOT 0.2 (L) 01/22/2015 1559            LABORATORY DATA:  I have reviewed the data as listed    Component Value Date/Time   NA 133 (L) 04/02/2017 0815   NA 135 03/04/2017 0918   NA 135 01/08/2015 0850   K 3.4 (L) 04/02/2017 0815   K 3.7 01/08/2015 0850   CL 102 04/02/2017 0815   CL 101 01/08/2015 0850   CO2 25 04/02/2017 0815   CO2 26 01/08/2015 0850   GLUCOSE 100 (H) 04/02/2017 0815   GLUCOSE 161 (H) 01/08/2015 0850   BUN 11 04/02/2017 0815   BUN 8 03/04/2017 0918   BUN 17 01/08/2015 0850   CREATININE 0.85 04/02/2017 0815   CREATININE 0.82 01/22/2015 1559   CALCIUM 8.2 (L) 04/02/2017 0815   CALCIUM 9.3 01/08/2015 0850   PROT 6.7 04/02/2017 0815   PROT 6.4 03/04/2017 0918   PROT 6.8 01/22/2015 1559   ALBUMIN 2.0 (  L) 04/02/2017 0815   ALBUMIN 2.5 (L) 03/04/2017 0918   ALBUMIN 3.9 01/22/2015 1559   AST 81 (H) 04/02/2017 0815   AST 34 01/22/2015 1559   ALT 35 04/02/2017 0815   ALT 42 01/22/2015 1559   ALKPHOS 231 (H) 04/02/2017 0815   ALKPHOS 157 (H) 01/22/2015 1559   BILITOT 2.1 (H) 04/02/2017 0815   BILITOT 6.2 (H) 03/04/2017 0918   BILITOT 0.2 (L) 01/22/2015 1559   GFRNONAA >60 04/02/2017 0815   GFRNONAA >60 01/22/2015 1559   GFRAA >60 04/02/2017 0815   GFRAA >60 01/22/2015 1559    No results found for: SPEP, UPEP  Lab Results  Component Value Date   WBC 1.0 (LL) 04/02/2017   NEUTROABS 0.5 (L) 04/02/2017   HGB 9.9 (L) 04/02/2017   HCT 30.2 (L) 04/02/2017   MCV 98.3 04/02/2017   PLT 58 (L) 04/02/2017      Chemistry      Component Value  Date/Time   NA 133 (L) 04/02/2017 0815   NA 135 03/04/2017 0918   NA 135 01/08/2015 0850   K 3.4 (L) 04/02/2017 0815   K 3.7 01/08/2015 0850   CL 102 04/02/2017 0815   CL 101 01/08/2015 0850   CO2 25 04/02/2017 0815   CO2 26 01/08/2015 0850   BUN 11 04/02/2017 0815   BUN 8 03/04/2017 0918   BUN 17 01/08/2015 0850   CREATININE 0.85 04/02/2017 0815   CREATININE 0.82 01/22/2015 1559      Component Value Date/Time   CALCIUM 8.2 (L) 04/02/2017 0815   CALCIUM 9.3 01/08/2015 0850   ALKPHOS 231 (H) 04/02/2017 0815   ALKPHOS 157 (H) 01/22/2015 1559   AST 81 (H) 04/02/2017 0815   AST 34 01/22/2015 1559   ALT 35 04/02/2017 0815   ALT 42 01/22/2015 1559   BILITOT 2.1 (H) 04/02/2017 0815   BILITOT 6.2 (H) 03/04/2017 0918   BILITOT 0.2 (L) 01/22/2015 1559     IMPRESSION: Normal left ventricular wall motion with estimated ejection fraction of 65%.   Electronically Signed   By: Marijo Sanes M.D.   On: 07/27/2016 09:04  RADIOGRAPHIC STUDIES: I have personally reviewed the radiological images as listed and agreed with the findings in the report. No results found.   ASSESSMENT & PLAN:  Carcinoma of upper-outer quadrant of left breast in female, estrogen receptor positive ([place]) # RECURRENT Metastatic breast cancer- ER/PR positive HER-2/neu negative [status post liver biopsy February 2018; previously HER-2/neu positive]. Currently on palliative Faslodex+ Abemaciclib.  # Proceed with Faslodex today; HOLD abemacliclib sec to neutropenia [C discussion below]. Clinically response noted- improvement of the LFTs/bilirubin; tumor markers.  # Neutropenia- WBC- 1.0/ANC- 0.4; HOLD abemaciclib x1 week  # Liver failure- sec to progressive malignancy- ascites times paracentesis 2. Monitor closely.  # Brain mets- s/p RT [finished Feb 28th]; symptomatic improvement.   MRI May 2018- improved.  # PN G-3 secondary to chemotherapy. Continue Lyrica; oxycodone.  # Hypokalemia- continue K supp.    #  Debility- recommend PT at home.  # Palliative care- consultation. Discussed with the patient and brother regarding the importance of palliative care evaluation/ and transition to hospice if and when patient not a candidate for any further therapies. We will plan to continue current plan of care. Patient and brother agreeable with the plan.  # faslodex today; repeat cbc/ in 1 week [re-starting abemaciclib]; and see me in 2 weeks/labs- cbc/cmp.   No orders of the defined types were placed in this encounter.  All questions were answered. The patient knows to call the clinic with any problems, questions or concerns.      Cammie Sickle, MD 04/02/2017 7:00 PM

## 2017-04-02 NOTE — Patient Instructions (Signed)
HOLD VERENZIO for 1 week; will start after repeating labs in 1 week

## 2017-04-02 NOTE — Assessment & Plan Note (Addendum)
#  RECURRENT Metastatic breast cancer- ER/PR positive HER-2/neu negative [status post liver biopsy February 2018; previously HER-2/neu positive]. Currently on palliative Faslodex+ Abemaciclib.  # Proceed with Faslodex today; HOLD abemacliclib sec to neutropenia [C discussion below]. Clinically response noted- improvement of the LFTs/bilirubin; tumor markers.  # Neutropenia- WBC- 1.0/ANC- 0.4; HOLD abemaciclib x1 week  # Liver failure- sec to progressive malignancy- ascites times paracentesis 2. Monitor closely.  # Brain mets- s/p RT [finished Feb 28th]; symptomatic improvement.   MRI May 2018- improved.  # PN G-3 secondary to chemotherapy. Continue Lyrica; oxycodone.  # Hypokalemia- continue K supp.   #  Debility- recommend PT at home.  # Palliative care- consultation. Discussed with the patient and brother regarding the importance of palliative care evaluation/ and transition to hospice if and when patient not a candidate for any further therapies. We will plan to continue current plan of care. Patient and brother agreeable with the plan.  # faslodex today; repeat cbc/ in 1 week [re-starting abemaciclib]; and see me in 2 weeks/labs- cbc/cmp.

## 2017-04-02 NOTE — Progress Notes (Signed)
Patient here today for follow up.   

## 2017-04-02 NOTE — Progress Notes (Signed)
fyi

## 2017-04-03 LAB — CANCER ANTIGEN 27.29: CAN 27.29: 32.4 U/mL (ref 0.0–38.6)

## 2017-04-03 LAB — CANCER ANTIGEN 15-3: CA 15-3: 24.1 U/mL (ref 0.0–25.0)

## 2017-04-09 ENCOUNTER — Telehealth: Payer: Self-pay | Admitting: *Deleted

## 2017-04-09 ENCOUNTER — Inpatient Hospital Stay: Payer: BLUE CROSS/BLUE SHIELD | Admitting: Oncology

## 2017-04-09 DIAGNOSIS — K7290 Hepatic failure, unspecified without coma: Secondary | ICD-10-CM | POA: Diagnosis not present

## 2017-04-09 DIAGNOSIS — C50412 Malignant neoplasm of upper-outer quadrant of left female breast: Secondary | ICD-10-CM

## 2017-04-09 DIAGNOSIS — T451X5S Adverse effect of antineoplastic and immunosuppressive drugs, sequela: Secondary | ICD-10-CM | POA: Diagnosis not present

## 2017-04-09 DIAGNOSIS — Z170 Estrogen receptor positive status [ER+]: Secondary | ICD-10-CM

## 2017-04-09 DIAGNOSIS — D701 Agranulocytosis secondary to cancer chemotherapy: Secondary | ICD-10-CM | POA: Diagnosis not present

## 2017-04-09 DIAGNOSIS — R29898 Other symptoms and signs involving the musculoskeletal system: Secondary | ICD-10-CM

## 2017-04-09 DIAGNOSIS — Z923 Personal history of irradiation: Secondary | ICD-10-CM | POA: Diagnosis not present

## 2017-04-09 DIAGNOSIS — Z79818 Long term (current) use of other agents affecting estrogen receptors and estrogen levels: Secondary | ICD-10-CM | POA: Diagnosis not present

## 2017-04-09 DIAGNOSIS — E876 Hypokalemia: Secondary | ICD-10-CM | POA: Diagnosis not present

## 2017-04-09 DIAGNOSIS — Z9221 Personal history of antineoplastic chemotherapy: Secondary | ICD-10-CM | POA: Diagnosis not present

## 2017-04-09 DIAGNOSIS — Z853 Personal history of malignant neoplasm of breast: Secondary | ICD-10-CM | POA: Diagnosis not present

## 2017-04-09 DIAGNOSIS — C787 Secondary malignant neoplasm of liver and intrahepatic bile duct: Secondary | ICD-10-CM | POA: Diagnosis not present

## 2017-04-09 DIAGNOSIS — T386X5S Adverse effect of antigonadotrophins, antiestrogens, antiandrogens, not elsewhere classified, sequela: Secondary | ICD-10-CM | POA: Diagnosis not present

## 2017-04-09 DIAGNOSIS — D709 Neutropenia, unspecified: Secondary | ICD-10-CM | POA: Diagnosis not present

## 2017-04-09 DIAGNOSIS — C7951 Secondary malignant neoplasm of bone: Secondary | ICD-10-CM | POA: Diagnosis not present

## 2017-04-09 DIAGNOSIS — G620 Drug-induced polyneuropathy: Secondary | ICD-10-CM | POA: Diagnosis not present

## 2017-04-09 DIAGNOSIS — C7931 Secondary malignant neoplasm of brain: Secondary | ICD-10-CM | POA: Diagnosis not present

## 2017-04-09 DIAGNOSIS — E860 Dehydration: Secondary | ICD-10-CM

## 2017-04-09 LAB — CBC
HEMATOCRIT: 31.2 % — AB (ref 35.0–47.0)
HEMOGLOBIN: 10.5 g/dL — AB (ref 12.0–16.0)
MCH: 33.4 pg (ref 26.0–34.0)
MCHC: 33.7 g/dL (ref 32.0–36.0)
MCV: 99.1 fL (ref 80.0–100.0)
Platelets: 60 10*3/uL — ABNORMAL LOW (ref 150–440)
RBC: 3.15 MIL/uL — ABNORMAL LOW (ref 3.80–5.20)
RDW: 26.6 % — AB (ref 11.5–14.5)
WBC: 0.8 10*3/uL — AB (ref 3.6–11.0)

## 2017-04-09 NOTE — Telephone Encounter (Signed)
Spoke with patient. Pt provided with verbal instructions on neutropenic precautions. Pt has not had any verenzio in 1 week. Per pt, she was told to stop taking this last week.  I educated pt to continue to stay off of verenzio until she is further instructed to do so. Pt has an apt July 23rd with Dr. Jacinto Reap.  Also, pt asked if Dr. B could order PT for upper/lower extremity weakness. I personally spoke with Dr. Jacinto Reap. He agreed to sign PT orders for patient. Orders entered. Pt aware that PT would contact her to set up an apt.

## 2017-04-09 NOTE — Telephone Encounter (Signed)
-----   Message from Cammie Sickle, MD sent at 04/09/2017 12:22 PM EDT ----- Please inform patient that blood counts are still low; recommend holding off verzinio [abemaciclib/ breast cancer pill] for now. Follow-up is planned in 10 days from now.

## 2017-04-12 ENCOUNTER — Telehealth: Payer: Self-pay | Admitting: *Deleted

## 2017-04-12 NOTE — Telephone Encounter (Signed)
Home health referral for PT initiated. [facility] faxed to [facility]

## 2017-04-12 NOTE — Telephone Encounter (Signed)
Patient requesting the PT order be for In home therapy, she cannot drive to get to appts. [place] advise

## 2017-04-13 ENCOUNTER — Ambulatory Visit (INDEPENDENT_AMBULATORY_CARE_PROVIDER_SITE_OTHER): Payer: BLUE CROSS/BLUE SHIELD | Admitting: Physician Assistant

## 2017-04-13 ENCOUNTER — Encounter: Payer: Self-pay | Admitting: Physician Assistant

## 2017-04-13 VITALS — BP 110/60 | HR 98 | Ht 66.0 in | Wt 138.0 lb

## 2017-04-13 DIAGNOSIS — I471 Supraventricular tachycardia: Secondary | ICD-10-CM

## 2017-04-13 DIAGNOSIS — E8809 Other disorders of plasma-protein metabolism, not elsewhere classified: Secondary | ICD-10-CM | POA: Diagnosis not present

## 2017-04-13 DIAGNOSIS — I427 Cardiomyopathy due to drug and external agent: Secondary | ICD-10-CM | POA: Diagnosis not present

## 2017-04-13 DIAGNOSIS — I872 Venous insufficiency (chronic) (peripheral): Secondary | ICD-10-CM | POA: Diagnosis not present

## 2017-04-13 DIAGNOSIS — T451X5A Adverse effect of antineoplastic and immunosuppressive drugs, initial encounter: Secondary | ICD-10-CM | POA: Diagnosis not present

## 2017-04-13 DIAGNOSIS — E876 Hypokalemia: Secondary | ICD-10-CM | POA: Diagnosis not present

## 2017-04-13 DIAGNOSIS — R0989 Other specified symptoms and signs involving the circulatory and respiratory systems: Secondary | ICD-10-CM

## 2017-04-13 DIAGNOSIS — C50919 Malignant neoplasm of unspecified site of unspecified female breast: Secondary | ICD-10-CM | POA: Diagnosis not present

## 2017-04-13 NOTE — Progress Notes (Signed)
Cardiology Office Note Date:  04/13/2017  Patient ID:  Sarah Horne 09-Jul-1956, MRN [id number] PCP:  [name], MD  Cardiologist:  Dr. Fletcher Anon, MD    Chief Complaint: Follow up pSVT  History of Present Illness: Sarah Horne is a 61 y.o. female with history of Herceptin-induced cardiomyopathy with patient reported possible prior myocardial infarction in 1998 where she underwent cardiac catheterization at [facility] and did not require revascularization, she was treated medically (details of that admission are not available). She also has metastatic breast cancer diagnosed in 2016 with metastasis to the liver and brain, recently diagnosed pulmonary embolism and mid April 2018 s/p IVC filter, pSVT, recurrent ascites requiring periodic paracentesis, hypertension, hyperlipidemia, and tobacco abuse who presents for follow up of her SVT.   She started treatment with Taxotere/Carboplatin/Herceptin/Perjeta in April 2016. She underwent a MUGA scan in July 2016 which showed an ejection fraction of 63%. This dropped to 51% in December. Given that she was asymptomatic, Herceptin was given in December. She underwent repeat MUGA scan on 11/25/2015 that showed a drop in ejection fraction to 44%. TTE in March 2017 showed an ejection fraction of 45-50% with no significant valvular abnormalities or pulmonary hypertension. At that time she was switched from atenolol to carvedilol. She underwent repeat MUGA scan in May 2017 which showed an ejection fraction of 60%, her Herceptin was resumed at that time. She was seen by Dr. Fletcher Anon on 04/14/2016 and doing well at that time. She underwent repeat MUGA scan in 06/2016 that showed ejection fraction of 65%. She was continued on Herceptin. She was admitted to [facility] in mid February 2018 with left sided headache and expressive aphasia. CT scan of the head showed left frontoparietal mass with surrounding edema consistent with new brain metastasis from her primary breast  cancer. She was treated with IV Decadron followed by Decadron taper. MRI brain showed metastatic lesion along left temporal lobe with extensive surrounding edema and a 5 mm midline shift to the right. The mass measured approximately 19 x 18 mm. She last underwent MUGA scan on 11/16/2016 that showed ejection fraction of 61% that was minimally decreased from 65% on 07/24/2016. An oncology follow-up, it was recommended patient undergo palliative chemotherapy after radiation to the brain with recommendation of Taxotere/Cytoxan. She was subsequently admitted to [facility] in early March 2018 with hypovolemic shock secondary to GI bleed with EGD showing gastritis and nonbleeding peptic ulcer and colonoscopy showing ulcers of the rectum and sigmoid region. She required 3 units of packed red blood cell transfusion. She was seen by GI who okayed the initiation of aspirin and Plavix for the treatment of the patient's peripheral arterial disease given tobacco abuse. During that admission she was noted to have bilateral lower extremity cyanosis and pallor with cold feet. Pulses were initially only noted on Doppler ultrasound though status post transfusions as above patient was noted to have strong palpable peripheral lower extremity pulses. She was seen by vascular surgery who recommended no vascular surgical intervention at that time. She was seen in the ED on 12/19/2016 with epistaxis requiring silver nitrate cauterization. She was seen by oncology on 12/25/2016 for routine follow-up and noted to be dehydrated at that time secondary to poor by mouth intake secondary to pain with swallowing/thrush. She was treated with IV hydration on 3/30 with planned Granix infusion on 3/31. While in the cancer center on 3/31 patient was noted to be hypotensive with blood pressure 88/51 as well as tachycardic with heart  rate of 171 BPM. She was transferred to the ER where 12-lead EKG showed SVT, 165 BPM, no acute ST-T changes. Patient reportedly  converted to sinus tachycardia at 103 bpm prior to MD evaluating and treating patient. No labs were drawn as patient had recently had cbc/cmet drawn at the [facility] one day prior that showed a potassium of 3.5, albumin 2.4, AST 118, ALT 88, alkaline phosphatase 343, serum creatinine 0.45, white blood cell count 0.6, hemoglobin 9.4, platelet count 160. She was asymptomatic in the ER. ER physician discussed case with oncology who felt patient could be discharged from the ED. Since that ED visit on 3/31 patient has received IV infusions for dehydration on 4/1, 4/2, and 4/3. At each visit patient was noted to be tachycardic with heart rates ranging from the 110s to 120s. There are no EKGs for review from those dates. She was seen in mid April for evaluation of elevated heart rate. At that time it was unclear if she had been taking her carvedilol. She was also noted to have soft blood pressures in the 90s to low 659D systolic. She was doing well at that time. Her carvedilol was changed to metoprolol 12.5 twice a day with titration as needed for heart rate control and less antihypertensive effect given her some blood pressures. She was admitted in mid April 2018 for acute pulmonary embolism. Dopplers of lower extremities were negative for DVT. TTE showed EF 55-60%, normal wall motion, GR1DD, left atrium normal size, RV systolic function was normal, PASP normal. She was placed on low dose Xarelto at time of discharge. She was noted to have a mildly elevated troponin that was felt to be secondary to supply demand ischemia from pulmonary embolism. She was noted to be tachycardic with possible SVT felt to be in the setting of hypokalemia. She continued to be mildly orthostatic thus her metoprolol was not titrated up. She was started on flecainide for rhythm control. She was most recently admitted in mid May 2018 for GI bleed. She underwent colonoscopy that showed a few polyps in the sigmoid colon that were treated with  argon beam coagulation. These polyps were noted to be oozing blood. Hemoglobin remained stable. Also noted was right foot drop/weakness with MRI showing no evidence of CVA. There was some improvement in her brain metastatic disease without cerebral edema. Regarding her pulmonary embolism in the setting of her GI bleed vascular was consult was obtained and an IVC filter was placed on 5/21. She was again noted to be hypokalemic and repleted. She again had a mild troponin elevation with a peak of 0.06 felt to be in the setting of demand ischemia from the above. In cardiology follow up on 03/04/17 she was overall doing well. Her metoprolol had been discontinued due to soft blood pressures by outside provider. Since that visit, she has had multiple visits with the cancer center for IV hydration. She has also been seen by GI without further bleeding. MR on 6/14 showed development of moderate to large volume ascites (likely malignant) and new bilateral pleural effusions. She has undergone paracentesis on 6/21 and 7/3 for recurrent ascites in the setting of her metastatic breast cancer.  She comes in doing well today. She does not have any concerns or questions at this time. Her only request is she would like to be able to take a shower in her room rather than taking a "bird bath." She feels like this would greatly boost her spirits. Her BP has been stable  in the low 154M to 086 systolic with a heart rate in the 80s to 90s bpm. No further palpitations or dizziness. She is working with PT and continues to use her walker. No chest pain or SOB. She last had a paracentesis ~ 3 weeks prior and continues to do well. She feels like she has considerable more energy than when she was last seen. She is tolerating her medications without issues.    Past Medical History:  Diagnosis Date  . Chemotherapy-induced peripheral neuropathy ([place])   . Epistaxis    a. 11/2016 in setting of asa/plavix-->silver nitrate cauterization.  . GI  bleed    a. 11/2016 Admission w/ GIB and hypovolemic shock req 3u PRBC's;  b. 11/2016 ECG: gastritis & nonbleeding peptic ulcer; c. 11/2016 Conlonoscopy: rectal and sigmoid colonic ulcers.  . Heart attack ([place])    a. 1998 Cath @ [facility]: reportedly no intervention required.  Marland Kitchen Herceptin-induced cardiomyopathy ([place])    a. In the setting of Herceptin Rx for breast cancer (initiated 12/2014); b. 03/2015 MUGA EF 64%; b. 08/2015 MUGA: EF 51%; c. 10/2015 MUGA: EF 44%; d. 11/2015 Echo: EF 45-50%; e. 01/2016 MUGA: EF 60%; f. 06/2016 MUGA EF 65%; g. 10/2016 MUGA: EF 61%;  h. 12/2016 Echo: EF 55-60%, gr1 DD.  Marland Kitchen Hyperlipidemia   . Hypertension   . Neuropathy   . Possible PAD (peripheral artery disease) ([place])    a. 11/2016 LE cyanosis and weak pulses-->CTA w/o significant Ao-BiFem dzs. ? distal dzs-->ASA/Plavix initiated by vascular surgery.  Marland Kitchen PSVT (paroxysmal supraventricular tachycardia) ([place])    a. Dx 11/2016.  . Pulmonary embolism ([place])    a. 12/2016 CTA Chest: small nonocclusive PE in inferior segment of the Left lingula, somewhat eccentric filling defect suggesting chronic rather than acute embolic event; b. 03/6194 LE U/S:  No DVT; c. 12/2016 Echo: Nl RV fxn, nl PASP.  Marland Kitchen Recurrent Metastatic breast cancer ([place])    a. Dx 2016: Stage II, ER positive, PR positive, HER-2/neu overexpressing of the left breast-->chemo/radiation; b. 10/2016 CT Abd/pelvis: diffuse liver mets, ill defined sclerotic bone lesions-T12;  c. 10/2016 MRI brain: metastatic lesion along L temporal lobe (19x105m) w/ extensive surrounding edema & 518mmidline shift to right.  . Sinus tachycardia     Past Surgical History:  Procedure Laterality Date  . BREAST BIOPSY Left 2016   Positive  . BREAST LUMPECTOMY WITH SENTINEL LYMPH NODE BIOPSY Left 05/23/2015   Procedure: LEFT BREAST WIDE EXCISION WITH AXILLARY DISSECTION, MASTOPLASTY ;  Surgeon: JeRobert BellowMD;  Location: [facility] ORS;  Service: General;  Laterality: Left;  . BREAST SURGERY Left 12/18/14     breast biopsy/INVASIVE DUCTAL CARCINOMA OF BREAST, NOTTINGHAM GRADE 2.  . Marland KitchenREAST SURGERY  05/23/2015.   Wide excision/mastoplasty, axillary dissection. No residual invasive cancer, positive for residual DCIS. 0/2 nodes identified on axillary dissection. (no SLN by technetium or methylene blue)  . CARDIAC CATHETERIZATION    . COLONOSCOPY WITH PROPOFOL N/A 12/10/2016   Procedure: COLONOSCOPY WITH PROPOFOL;  Surgeon: DaLucilla LameMD;  Location: [facility] ENDOSCOPY;  Service: Endoscopy;  Laterality: N/A;  . COLONOSCOPY WITH PROPOFOL N/A 02/13/2017   Procedure: COLONOSCOPY WITH PROPOFOL;  Surgeon: WoLucilla LameMD;  [facility];  Service: Endoscopy;  Laterality: N/A;  . ESOPHAGOGASTRODUODENOSCOPY (EGD) WITH PROPOFOL N/A 12/08/2016   Procedure: ESOPHAGOGASTRODUODENOSCOPY (EGD) WITH PROPOFOL;  Surgeon: DaLucilla LameMD;  Location: [facility] ENDOSCOPY;  Service: Endoscopy;  Laterality: N/A;  . IVC FILTER INSERTION N/A 02/15/2017   Procedure: IVC Filter Insertion;  Surgeon: Algernon Huxley, MD;  Location: [place] CV LAB;  Service: Cardiovascular;  Laterality: N/A;  . PORTACATH PLACEMENT Right 12-31-14   Dr Bary Castilla    Current Meds  Medication Sig  . Abemaciclib 150 MG TABS Take 150 mg by mouth daily.  Marland Kitchen anastrozole (ARIMIDEX) 1 MG tablet Take 1 tablet (1 mg total) by mouth daily.  . diphenoxylate-atropine (LOMOTIL) 2.5-0.025 MG tablet Take 1 tablet by mouth 4 (four) times daily as needed for diarrhea or loose stools. Take it along with immodium  . feeding supplement, ENSURE ENLIVE, (ENSURE ENLIVE) LIQD Take 237 mLs by mouth 2 (two) times daily between meals.  . flecainide (TAMBOCOR) 50 MG tablet Take 1 tablet (50 mg total) by mouth 2 (two) times daily.  . magnesium oxide (MAG-OX) 400 MG tablet Take 1 tablet (400 mg total) by mouth 2 (two) times daily.  Marland Kitchen nystatin (MYCOSTATIN) 100000 UNIT/ML suspension Take 5 mLs (500,000 Units total) by mouth 4 (four) times daily.  . ondansetron (ZOFRAN) 8 MG tablet  Take 1 tablet (8 mg total) by mouth every 8 (eight) hours as needed for nausea or vomiting (start 3 days; after chemo).  Marland Kitchen oxyCODONE (OXY IR/ROXICODONE) 5 MG immediate release tablet Take 1 tablet (5 mg total) by mouth every 8 (eight) hours as needed for severe pain.  . potassium chloride SA (K-DUR,KLOR-CON) 20 MEQ tablet Take 1 tablet (20 mEq total) by mouth daily.  . pregabalin (LYRICA) 75 MG capsule Take 1 capsule (75 mg total) by mouth 2 (two) times daily.    Allergies:   No known allergies   Social History:  The patient  reports that she has been smoking Cigarettes.  She has a 9.00 pack-year smoking history. She has never used smokeless tobacco. She reports that she does not drink alcohol or use drugs.   Family History:  The patient's family history includes Brain cancer in her maternal uncle; Breast cancer in her cousin and maternal aunt; Diabetes in her mother; Hypertension in her mother; Stroke in her mother.  ROS:   Review of Systems  Constitutional: Positive for malaise/fatigue. Negative for chills, diaphoresis, fever and weight loss.  HENT: Negative for congestion.   Eyes: Negative for discharge and redness.  Respiratory: Negative for cough, hemoptysis, sputum production, shortness of breath and wheezing.   Cardiovascular: Positive for leg swelling. Negative for chest pain, palpitations, orthopnea, claudication and PND.  Gastrointestinal: Negative for abdominal pain, blood in stool, heartburn, melena, nausea and vomiting.  Genitourinary: Negative for hematuria.  Musculoskeletal: Positive for joint pain and myalgias. Negative for falls.  Skin: Negative for rash.  Neurological: Negative for dizziness, tingling, tremors, sensory change, speech change, focal weakness, loss of consciousness and weakness.  Endo/Heme/Allergies: Does not bruise/bleed easily.  Psychiatric/Behavioral: Negative for substance abuse. The patient is not nervous/anxious.   All other systems reviewed and are  negative.    PHYSICAL EXAM:  VS:  BP 110/60 (BP Location: Right Arm, Patient Position: Sitting, Cuff Size: Normal)   Pulse 98   Ht 5' 6"  (1.676 m)   Wt 138 lb (62.6 kg)   BMI 22.27 kg/m  BMI: Body mass index is 22.27 kg/m.  Physical Exam  Constitutional: She is oriented to person, place, and time. She appears well-developed and well-nourished.  HENT:  Head: Normocephalic and atraumatic.  Eyes: Right eye exhibits no discharge. Left eye exhibits no discharge.  Neck: Normal range of motion. No JVD present.  Cardiovascular: Normal rate, regular rhythm, S1 normal, S2 normal and normal  heart sounds.  Exam reveals no distant heart sounds, no friction rub, no midsystolic click and no opening snap.   No murmur heard. Pulmonary/Chest: Effort normal and breath sounds normal. No respiratory distress. She has no decreased breath sounds. She has no wheezes. She has no rales. She exhibits no tenderness.  Abdominal: Soft. She exhibits no distension. There is no tenderness.  Musculoskeletal: She exhibits edema.  Trace pre-tibial edema  Neurological: She is alert and oriented to person, place, and time.  Skin: Skin is warm and dry. No cyanosis. Nails show no clubbing.  Psychiatric: She has a normal mood and affect. Her speech is normal and behavior is normal. Judgment and thought content normal.     EKG:  Was ordered and interpreted by me today. Shows NSR, 98 bpm, 1st degree AV block, low voltage QRS possible inferior Q waves, no acute st/t changes  Recent Labs: 01/12/2017: B Natriuretic Peptide 167.0; TSH 1.410 03/16/2017: Magnesium 1.7 04/02/2017: ALT 35; BUN 11; Creatinine, Ser 0.85; Potassium 3.4; Sodium 133 04/09/2017: Hemoglobin 10.5; Platelets 60  01/18/2017: Cholesterol 136; HDL 19; LDL Cholesterol 92; Total CHOL/HDL Ratio 7.2; Triglycerides 127; VLDL 25   Estimated Creatinine Clearance: 63.4 mL/min (by C-G formula based on SCr of 0.85 mg/dL).   Wt Readings from Last 3 Encounters:  04/13/17  138 lb (62.6 kg)  04/02/17 142 lb (64.4 kg)  03/16/17 171 lb 8 oz (77.8 kg)     Other studies reviewed: Additional studies/records reviewed today include: summarized above  ASSESSMENT AND PLAN:  1. Paroxysmal SVT: Remains in sinus rhythm without palpitations. Discussed with Dr. Fletcher Anon. She was previously on metoprolol and flecainide, though her metoprolol was held given hypotension. Her blood pressures have improved nicely with the holding of metoprolol. Her SBP is 100 mmHg today. She would like to continue without adding back metoprolol at this time. She will continue flecainide 50 mg bid. Continue to maintain electrolytes at goal.   2. Chemotherapy-induced cardiomyopathy: Recent echo as above with normal EF. No evidence of volume overload at this time. No changes.   3. Likely venous insufficiency versus third spacing from hypoalbuminemia: Most recent albumin from 7/6 of 2.0. Continues to decline compression stockings. Elevate legs. Per oncology.   4. Labile HTN: BP seems to have stabilized off metoprolol. No changes.   5. Ascites/pulmonary effusions: Much improved s/p paracentesis. Marland Kitchen Possibly 2/2 third spacing from her hypoalbuminemia vs malignancy. Recent echo in 12/2016 with normal EF and normal right-sided pressure.   6. HLD: No longer on statin given elevated LFTs.   7. Hypokalemia: Most recent potassium from 7/6 of 3.4. Continue repletion.   8. PE: Status post IVC filter. No issues with dyspnea.   9. Elevated LFTs: Most recent LFT with improved liver function.   10. History of GI bleed: Recent CBC with stable HGB.   11. Metastatic breast cancer: Followed by oncology.   12. Weakness: Likely multifactorial including all of the above. Working with PT. She would benefit from having a HH aid come out to assist her with bathing. This too would greatly improve her quality of life. We will look into getting her a HH aid. She will talk with her PCP about getting Meals on Wheels.    Disposition: F/u with Dr. Fletcher Anon, MD in 3 months.   Current medicines are reviewed at length with the patient today.  The patient did not have any concerns regarding medicines.  Melvern Banker PA-C 04/13/2017 2:11 PM     [facility] -  [address] [place], [place] [address] [phone]

## 2017-04-13 NOTE — Telephone Encounter (Signed)
That should be fine. She just really misses being able to shower and has no help at home. So if home health can be ordered at that visit or at least assessment then that would be great. Please let me know if you have any further needs or questions.

## 2017-04-13 NOTE — Telephone Encounter (Signed)
Sarah Horne, we are not scheduled to see her again until next Monday 04/19/17. Orders sent for PT yesterday to [facility]. Dr. Rogue Bussing can be the ordering provider, but we are not able to see pt until 7/23 to document the need for home health aide.

## 2017-04-13 NOTE — Patient Instructions (Signed)
[facility] will call to arrange.   Follow-Up: Your physician recommends that you schedule a follow-up appointment in: 3 months.  It was a pleasure seeing you today here in the office. Please do not hesitate to give [place] a call back if you have any further questions. [place], BSN

## 2017-04-15 ENCOUNTER — Telehealth: Payer: Self-pay | Admitting: *Deleted

## 2017-04-15 NOTE — Telephone Encounter (Signed)
Msg reply back to Corene Cornea that the patient refused this apt with outpatient PT. The patient had called our office this past week and requested in home PT. - Patient requesting the PT order be for In home therapy, she cannot drive to get to appts per phone note.  I personally left VM for patient to clarify where she would like her PT performed. I explained to the patient that I am follow-up to ensure that she received PT.

## 2017-04-15 NOTE — Telephone Encounter (Signed)
-----   Message from Florene Glen sent at 04/15/2017  9:52 AM EDT ----- Regarding: Patient goign to outpatient therapy Contact: [phone] Staffing coordinator called patient to do welcome call 04/14/17 / verify info and she notified us she already has an appt set up with [facility] starting next week. Patient will not be taken under care.   Thanks, [employer]

## 2017-04-19 ENCOUNTER — Inpatient Hospital Stay (HOSPITAL_BASED_OUTPATIENT_CLINIC_OR_DEPARTMENT_OTHER): Payer: BLUE CROSS/BLUE SHIELD | Admitting: Internal Medicine

## 2017-04-19 ENCOUNTER — Inpatient Hospital Stay: Payer: BLUE CROSS/BLUE SHIELD

## 2017-04-19 VITALS — BP 121/81 | HR 93 | Temp 97.6°F | Resp 20 | Ht 66.0 in | Wt 140.0 lb

## 2017-04-19 DIAGNOSIS — Z923 Personal history of irradiation: Secondary | ICD-10-CM

## 2017-04-19 DIAGNOSIS — C7931 Secondary malignant neoplasm of brain: Secondary | ICD-10-CM | POA: Diagnosis not present

## 2017-04-19 DIAGNOSIS — E876 Hypokalemia: Secondary | ICD-10-CM | POA: Diagnosis not present

## 2017-04-19 DIAGNOSIS — C50412 Malignant neoplasm of upper-outer quadrant of left female breast: Secondary | ICD-10-CM

## 2017-04-19 DIAGNOSIS — Z808 Family history of malignant neoplasm of other organs or systems: Secondary | ICD-10-CM

## 2017-04-19 DIAGNOSIS — Z853 Personal history of malignant neoplasm of breast: Secondary | ICD-10-CM

## 2017-04-19 DIAGNOSIS — Z86711 Personal history of pulmonary embolism: Secondary | ICD-10-CM

## 2017-04-19 DIAGNOSIS — D701 Agranulocytosis secondary to cancer chemotherapy: Secondary | ICD-10-CM

## 2017-04-19 DIAGNOSIS — Z170 Estrogen receptor positive status [ER+]: Secondary | ICD-10-CM

## 2017-04-19 DIAGNOSIS — Z79818 Long term (current) use of other agents affecting estrogen receptors and estrogen levels: Secondary | ICD-10-CM | POA: Diagnosis not present

## 2017-04-19 DIAGNOSIS — C787 Secondary malignant neoplasm of liver and intrahepatic bile duct: Secondary | ICD-10-CM

## 2017-04-19 DIAGNOSIS — Z803 Family history of malignant neoplasm of breast: Secondary | ICD-10-CM

## 2017-04-19 DIAGNOSIS — Z9221 Personal history of antineoplastic chemotherapy: Secondary | ICD-10-CM

## 2017-04-19 DIAGNOSIS — G620 Drug-induced polyneuropathy: Secondary | ICD-10-CM

## 2017-04-19 DIAGNOSIS — T451X5A Adverse effect of antineoplastic and immunosuppressive drugs, initial encounter: Secondary | ICD-10-CM

## 2017-04-19 DIAGNOSIS — T386X5S Adverse effect of antigonadotrophins, antiestrogens, antiandrogens, not elsewhere classified, sequela: Secondary | ICD-10-CM | POA: Diagnosis not present

## 2017-04-19 DIAGNOSIS — Z79899 Other long term (current) drug therapy: Secondary | ICD-10-CM

## 2017-04-19 DIAGNOSIS — I252 Old myocardial infarction: Secondary | ICD-10-CM

## 2017-04-19 DIAGNOSIS — T451X5S Adverse effect of antineoplastic and immunosuppressive drugs, sequela: Secondary | ICD-10-CM

## 2017-04-19 DIAGNOSIS — K7290 Hepatic failure, unspecified without coma: Secondary | ICD-10-CM

## 2017-04-19 DIAGNOSIS — I10 Essential (primary) hypertension: Secondary | ICD-10-CM

## 2017-04-19 DIAGNOSIS — E785 Hyperlipidemia, unspecified: Secondary | ICD-10-CM

## 2017-04-19 DIAGNOSIS — R531 Weakness: Secondary | ICD-10-CM

## 2017-04-19 DIAGNOSIS — E860 Dehydration: Secondary | ICD-10-CM

## 2017-04-19 DIAGNOSIS — M21371 Foot drop, right foot: Secondary | ICD-10-CM

## 2017-04-19 DIAGNOSIS — M8580 Other specified disorders of bone density and structure, unspecified site: Secondary | ICD-10-CM

## 2017-04-19 DIAGNOSIS — I471 Supraventricular tachycardia: Secondary | ICD-10-CM

## 2017-04-19 DIAGNOSIS — D709 Neutropenia, unspecified: Secondary | ICD-10-CM | POA: Diagnosis not present

## 2017-04-19 DIAGNOSIS — F17210 Nicotine dependence, cigarettes, uncomplicated: Secondary | ICD-10-CM

## 2017-04-19 DIAGNOSIS — R188 Other ascites: Secondary | ICD-10-CM

## 2017-04-19 DIAGNOSIS — C7951 Secondary malignant neoplasm of bone: Secondary | ICD-10-CM | POA: Diagnosis not present

## 2017-04-19 DIAGNOSIS — R748 Abnormal levels of other serum enzymes: Secondary | ICD-10-CM

## 2017-04-19 LAB — CBC
HEMATOCRIT: 31 % — AB (ref 35.0–47.0)
HEMOGLOBIN: 10.6 g/dL — AB (ref 12.0–16.0)
MCH: 33.3 pg (ref 26.0–34.0)
MCHC: 34.1 g/dL (ref 32.0–36.0)
MCV: 97.8 fL (ref 80.0–100.0)
Platelets: 140 10*3/uL — ABNORMAL LOW (ref 150–440)
RBC: 3.17 MIL/uL — ABNORMAL LOW (ref 3.80–5.20)
RDW: 24.7 % — ABNORMAL HIGH (ref 11.5–14.5)
WBC: 1.3 10*3/uL — AB (ref 3.6–11.0)

## 2017-04-19 MED ORDER — ABEMACICLIB 50 MG PO TABS: 1.0000 | ORAL_TABLET | Freq: Every day | ORAL | 3 refills | Status: DC

## 2017-04-19 NOTE — Assessment & Plan Note (Addendum)
#  RECURRENT Metastatic breast cancer- ER/PR positive HER-2/neu negative [status post liver biopsy February 2018; previously HER-2/neu positive]. Currently on palliative Faslodex+ Abemaciclib. Clinically improving. We will get CT scan before the next visit.  #  HOLD abemacliclib sec to neutropenia [C discussion below]. Clinically response noted- improvement of the LFTs/bilirubin; tumor markers. We will reinitiate abema- at 50 mg once a day at next visit. New prescription written.  # Neutropenia- WBC- 1.3; HOLD abemaciclib for 2 more weeks.   # Liver failure- sec to progressive malignancy- ascites times paracentesis 2. No evidence of worsening ascites at this time.   # Brain mets- s/p RT [finished Feb 28th]; symptomatic improvement.   MRI May 2018- improved.  # PN G-3 secondary to chemotherapy. Continue Lyrica; oxycodone once a day.  # Hypokalemia- continue K supp.   #  Debility/starting physical therapy tomorrow. needs shower chair.   #  Follow up me in 2 weeks/labs- cbc/cmp [[re-starting abemaciclib]; CT scan prior. Will start new script for abema. Reviewed the lab work progress with the patient and her brother in detail.

## 2017-04-19 NOTE — Progress Notes (Signed)
Pt requests a shower chair to assist her with bathing to improve shortness of breath and quality of life while performing ADLs. pt declines [place] aide. States that has long as she has a shower chair, she doesn't need any assitance with bathing.

## 2017-04-19 NOTE — Progress Notes (Signed)
[facility] OFFICE PROGRESS NOTE  Patient Care Team: [name], MD as PCP - General (Family Medicine) Bary Castilla, Forest Gleason, MD (General Surgery) [name], MD (Inactive) as Attending Physician (Internal Medicine)  No matching staging information was found for the patient.   Oncology History   # LEFT BREAST IDC; STAGE II [cT2N1] ER >90%; PR- 50-90%; her 2 NEU- POS; s/p Neoadj chemo; AUG 2016- TCH+P s/p Lumpec & partial ALND- path CR; s/p RT [finished Nov 2016];  adj Herceptin; HELD for Jan 19th 2017 [in DEC 2016-EF dropped from 63 to 51%; FEB 27th EF-42.9%]; JAN 2016 START Arimidex; May 2017- EF- 60%; May 24th 2017-Re-start Herceptin q 3W; July 28th STOP herceptin [finished 89m/m2; sec to Low EF; however 2017 Oct EF= 67%; improved]  # DEC 4th 2017- Start Neratinib 4 pills; DEC 11th 3 pills; STOPPED.  # FEB 2018- RECURRENCE ER/PR positive; Her 2 NEGATIVE [liver Bx]  # MARCH 1st 2018- Tax-Cytoxan  # FEB 2018- Brain mets [SBRT; Dr.Crystal; finished Feb 28th 2018]  # March 2018- Eribulin  # PN- G-2; may 2017- Cymbalta 624md;MARCH 2018-  acute vascular insuff of BIL LE [? Taxotere vasoconstriction on asprin]; # hemorragic shock LGIB [s/p colo; Dr.Wohl]    # April 2016- Liver Bx- NEG;   # Drop in EF from Herceptin [recovered OCt 27th- EF 65%]  # BMD- jan 2017- osteopenia     Carcinoma of upper-outer quadrant of left breast in female, estrogen receptor positive ([place]  12/27/2014 Initial Diagnosis    Breast cancer of upper-outer quadrant of left female breast ([facility] [place]       INTERVAL HISTORY:  A very pleasant [age]ear old female patient with above history of Recurrent stage IV ER/PR positive HER-2/neu negative [status post liver biopsy February 2018] - With intolerance to multiple chemotherapies- is currently on Faslodex-Abemaciclib is here for follow-up. Patient is currently off Abema x2 weeks- Given severe neutropenia.  Patient has not had any recent  hospitalizations. Patient did not had to have her ascites tapped in the interim.  Her appetite is improving. Energy levels are improving. She is more up and about with a walker at home. No falls. No headaches. She has been taking Lyrica 3 times a day; oxycodone 1 at nighttime. No fever no chills.  REVIEW OF SYSTEMS:  A complete 10 point review of system is done which is negative except mentioned above/history of present illness.   PAST MEDICAL HISTORY :  Past Medical History:  Diagnosis Date  . Chemotherapy-induced peripheral neuropathy ([facility] [place]  . Epistaxis    a. 11/2016 in setting of asa/plavix-->silver nitrate cauterization.  . GI bleed    a. 11/2016 Admission w/ GIB and hypovolemic shock req 3u PRBC's;  b. 11/2016 ECG: gastritis & nonbleeding peptic ulcer; c. 11/2016 Conlonoscopy: rectal and sigmoid colonic ulcers.  . Heart attack ([facility]   a. 1998 Cath @ [facility]: reportedly no intervention required.  . Marland Kitchenerceptin-induced cardiomyopathy ([facility]   a. In the setting of Herceptin Rx for breast cancer (initiated 12/2014); b. 03/2015 MUGA EF 64%; b. 08/2015 MUGA: EF 51%; c. 10/2015 MUGA: EF 44%; d. 11/2015 Echo: EF 45-50%; e. 01/2016 MUGA: EF 60%; f. 06/2016 MUGA EF 65%; g. 10/2016 MUGA: EF 61%;  h. 12/2016 Echo: EF 55-60%, gr1 DD.  . Marland Kitchenyperlipidemia   . Hypertension   . Neuropathy   . Possible PAD (peripheral artery disease) ([place]   a. 11/2016 LE cyanosis and weak pulses-->CTA w/o significant Ao-BiFem dzs. ?  distal dzs-->ASA/Plavix initiated by vascular surgery.  Marland Kitchen PSVT (paroxysmal supraventricular tachycardia) ([place])    a. Dx 11/2016.  . Pulmonary embolism ([place])    a. 12/2016 CTA Chest: small nonocclusive PE in inferior segment of the Left lingula, somewhat eccentric filling defect suggesting chronic rather than acute embolic event; b. 10/1113 LE U/S:  No DVT; c. 12/2016 Echo: Nl RV fxn, nl PASP.  Marland Kitchen Recurrent Metastatic breast cancer ([place])    a. Dx 2016: Stage II, ER positive, PR positive, HER-2/neu  overexpressing of the left breast-->chemo/radiation; b. 10/2016 CT Abd/pelvis: diffuse liver mets, ill defined sclerotic bone lesions-T12;  c. 10/2016 MRI brain: metastatic lesion along L temporal lobe (19x5m) w/ extensive surrounding edema & 555mmidline shift to right.  . Sinus tachycardia     PAST SURGICAL HISTORY :   Past Surgical History:  Procedure Laterality Date  . BREAST BIOPSY Left 2016   Positive  . BREAST LUMPECTOMY WITH SENTINEL LYMPH NODE BIOPSY Left 05/23/2015   Procedure: LEFT BREAST WIDE EXCISION WITH AXILLARY DISSECTION, MASTOPLASTY ;  Surgeon: JeRobert BellowMD;  Location: [facility] ORS;  Service: General;  Laterality: Left;  . BREAST SURGERY Left 12/18/14   breast biopsy/INVASIVE DUCTAL CARCINOMA OF BREAST, NOTTINGHAM GRADE 2.  . Marland KitchenREAST SURGERY  05/23/2015.   Wide excision/mastoplasty, axillary dissection. No residual invasive cancer, positive for residual DCIS. 0/2 nodes identified on axillary dissection. (no SLN by technetium or methylene blue)  . CARDIAC CATHETERIZATION    . COLONOSCOPY WITH PROPOFOL N/A 12/10/2016   Procedure: COLONOSCOPY WITH PROPOFOL;  Surgeon: DaLucilla LameMD;  Location: [facility] ENDOSCOPY;  Service: Endoscopy;  Laterality: N/A;  . COLONOSCOPY WITH PROPOFOL N/A 02/13/2017   Procedure: COLONOSCOPY WITH PROPOFOL;  Surgeon: WoLucilla LameMD;  [facility];  Service: Endoscopy;  Laterality: N/A;  . ESOPHAGOGASTRODUODENOSCOPY (EGD) WITH PROPOFOL N/A 12/08/2016   Procedure: ESOPHAGOGASTRODUODENOSCOPY (EGD) WITH PROPOFOL;  Surgeon: DaLucilla LameMD;  Location: [facility] ENDOSCOPY;  Service: Endoscopy;  Laterality: N/A;  . IVC FILTER INSERTION N/A 02/15/2017   Procedure: IVC Filter Insertion;  Surgeon: DeAlgernon HuxleyMD;  [facility];  Service: Cardiovascular;  Laterality: N/A;  . PORTACATH PLACEMENT Right 12-31-14   Dr ByBary Castilla  FAMILY HISTORY :   Family History  Problem Relation Age of Onset  . Breast cancer Maternal Aunt   . Breast  cancer Cousin   . Brain cancer Maternal Uncle   . Diabetes Mother   . Hypertension Mother   . Stroke Mother     SOCIAL HISTORY:   Social History  Substance Use Topics  . Smoking status: Light Tobacco Smoker    Packs/day: 0.50    Years: 18.00    Types: Cigarettes  . Smokeless tobacco: Never Used  . Alcohol use No    ALLERGIES:  is allergic to no known allergies.  MEDICATIONS:  Current Outpatient Prescriptions  Medication Sig Dispense Refill  . anastrozole (ARIMIDEX) 1 MG tablet Take 1 tablet (1 mg total) by mouth daily. 90 tablet 3  . feeding supplement, ENSURE ENLIVE, (ENSURE ENLIVE) LIQD Take 237 mLs by mouth 2 (two) times daily between meals. 237 mL 12  . flecainide (TAMBOCOR) 50 MG tablet Take 1 tablet (50 mg total) by mouth 2 (two) times daily. 60 tablet 1  . magnesium oxide (MAG-OX) 400 MG tablet Take 1 tablet (400 mg total) by mouth 2 (two) times daily. 60 tablet 6  . nystatin (MYCOSTATIN) 100000 UNIT/ML suspension Take 5 mLs (500,000 Units total) by  mouth 4 (four) times daily. 60 mL 0  . ondansetron (ZOFRAN) 8 MG tablet Take 1 tablet (8 mg total) by mouth every 8 (eight) hours as needed for nausea or vomiting (start 3 days; after chemo). 40 tablet 1  . oxyCODONE (OXY IR/ROXICODONE) 5 MG immediate release tablet Take 1 tablet (5 mg total) by mouth every 8 (eight) hours as needed for severe pain. 42 tablet 0  . potassium chloride SA (K-DUR,KLOR-CON) 20 MEQ tablet Take 1 tablet (20 mEq total) by mouth daily. 90 tablet 3  . pregabalin (LYRICA) 75 MG capsule Take 1 capsule (75 mg total) by mouth 2 (two) times daily. 60 capsule 2  . Abemaciclib 50 MG TABS Take 1 tablet by mouth daily. DO NOT START Until seen by MD. 30 tablet 3  . diphenoxylate-atropine (LOMOTIL) 2.5-0.025 MG tablet Take 1 tablet by mouth 4 (four) times daily as needed for diarrhea or loose stools. Take it along with immodium (Patient not taking: Reported on 04/19/2017) 60 tablet 0   No current facility-administered  medications for this visit.    Facility-Administered Medications Ordered in Other Visits  Medication Dose Route Frequency Provider Last Rate Last Dose  . 0.9 %  sodium chloride infusion   Intravenous Continuous Pandit, Sandeep, MD      . 0.9 %  sodium chloride infusion   Intravenous Continuous Lloyd Huger, MD 999 mL/hr at 04/24/15 1520    . heparin lock flush 100 unit/mL  500 Units Intravenous Once Berenzon, Dmitriy, MD      . sodium chloride 0.9 % injection 10 mL  10 mL Intravenous PRN [name], MD   10 mL at 04/04/15 1440  . sodium chloride flush (NS) 0.9 % injection 10 mL  10 mL Intravenous PRN Berenzon, Dmitriy, MD      . Tbo-Filgrastim (GRANIX) injection 480 mcg  480 mcg Subcutaneous Once Cammie Sickle, MD        PHYSICAL EXAMINATION: ECOG PERFORMANCE STATUS: 0 - Asymptomatic  BP 121/81 (BP Location: Right Arm, Patient Position: Sitting)   Pulse 93   Temp 97.6 F (36.4 C) (Tympanic)   Resp 20   Ht 5' 6"  (1.676 m)   Wt 140 lb (63.5 kg)   BMI 22.60 kg/m   Filed Weights   04/19/17 1122  Weight: 140 lb (63.5 kg)    GENERAL: Well-nourished well-developed; Alert, no distress and comfortable. Accompanied by her brother.   EYES: no pallor. No jaundice. She is in a wheelchair  OROPHARYNX: No  ulceration; good dentition ; NECK: supple, no masses felt LYMPH:  no palpable lymphadenopathy in the cervical, axillary or inguinal regions LUNGS: clear to auscultation and  No wheeze or crackles HEART/CVS: regular rate & rhythm and no murmurs; NO bilateral lower extremity edema.  ABDOMEN:abdomen soft, non-tender and normal bowel sounds Musculoskeletal:no cyanosis of digits and no clubbing  PSYCH: alert & oriented x 3 with fluent speech NEURO: no focal motor/sensory deficits- except for foot drop on the right side.   LABORATORY DATA:  I have reviewed the data as listed    Component Value Date/Time   NA 133 (L) 04/02/2017 0815   NA 135 03/04/2017 0918   NA 135  01/08/2015 0850   K 3.4 (L) 04/02/2017 0815   K 3.7 01/08/2015 0850   CL 102 04/02/2017 0815   CL 101 01/08/2015 0850   CO2 25 04/02/2017 0815   CO2 26 01/08/2015 0850   GLUCOSE 100 (H) 04/02/2017 0815   GLUCOSE 161 (H)  01/08/2015 0850   BUN 11 04/02/2017 0815   BUN 8 03/04/2017 0918   BUN 17 01/08/2015 0850   CREATININE 0.85 04/02/2017 0815   CREATININE 0.82 01/22/2015 1559   CALCIUM 8.2 (L) 04/02/2017 0815   CALCIUM 9.3 01/08/2015 0850   PROT 6.7 04/02/2017 0815   PROT 6.4 03/04/2017 0918   PROT 6.8 01/22/2015 1559   ALBUMIN 2.0 (L) 04/02/2017 0815   ALBUMIN 2.5 (L) 03/04/2017 0918   ALBUMIN 3.9 01/22/2015 1559   AST 81 (H) 04/02/2017 0815   AST 34 01/22/2015 1559   ALT 35 04/02/2017 0815   ALT 42 01/22/2015 1559   ALKPHOS 231 (H) 04/02/2017 0815   ALKPHOS 157 (H) 01/22/2015 1559   BILITOT 2.1 (H) 04/02/2017 0815   BILITOT 6.2 (H) 03/04/2017 0918   BILITOT 0.2 (L) 01/22/2015 1559   GFRNONAA >60 04/02/2017 0815   GFRNONAA >60 01/22/2015 1559   GFRAA >60 04/02/2017 0815   GFRAA >60 01/22/2015 1559    No results found for: SPEP, UPEP  Lab Results  Component Value Date   WBC 1.3 (LL) 04/19/2017   NEUTROABS 0.5 (L) 04/02/2017   HGB 10.6 (L) 04/19/2017   HCT 31.0 (L) 04/19/2017   MCV 97.8 04/19/2017   PLT 140 (L) 04/19/2017      Chemistry      Component Value Date/Time   NA 133 (L) 04/02/2017 0815   NA 135 03/04/2017 0918   NA 135 01/08/2015 0850   K 3.4 (L) 04/02/2017 0815   K 3.7 01/08/2015 0850   CL 102 04/02/2017 0815   CL 101 01/08/2015 0850   CO2 25 04/02/2017 0815   CO2 26 01/08/2015 0850   BUN 11 04/02/2017 0815   BUN 8 03/04/2017 0918   BUN 17 01/08/2015 0850   CREATININE 0.85 04/02/2017 0815   CREATININE 0.82 01/22/2015 1559      Component Value Date/Time   CALCIUM 8.2 (L) 04/02/2017 0815   CALCIUM 9.3 01/08/2015 0850   ALKPHOS 231 (H) 04/02/2017 0815   ALKPHOS 157 (H) 01/22/2015 1559   AST 81 (H) 04/02/2017 0815   AST 34 01/22/2015 1559    ALT 35 04/02/2017 0815   ALT 42 01/22/2015 1559   BILITOT 2.1 (H) 04/02/2017 0815   BILITOT 6.2 (H) 03/04/2017 0918   BILITOT 0.2 (L) 01/22/2015 1559            LABORATORY DATA:  I have reviewed the data as listed    Component Value Date/Time   NA 133 (L) 04/02/2017 0815   NA 135 03/04/2017 0918   NA 135 01/08/2015 0850   K 3.4 (L) 04/02/2017 0815   K 3.7 01/08/2015 0850   CL 102 04/02/2017 0815   CL 101 01/08/2015 0850   CO2 25 04/02/2017 0815   CO2 26 01/08/2015 0850   GLUCOSE 100 (H) 04/02/2017 0815   GLUCOSE 161 (H) 01/08/2015 0850   BUN 11 04/02/2017 0815   BUN 8 03/04/2017 0918   BUN 17 01/08/2015 0850   CREATININE 0.85 04/02/2017 0815   CREATININE 0.82 01/22/2015 1559   CALCIUM 8.2 (L) 04/02/2017 0815   CALCIUM 9.3 01/08/2015 0850   PROT 6.7 04/02/2017 0815   PROT 6.4 03/04/2017 0918   PROT 6.8 01/22/2015 1559   ALBUMIN 2.0 (L) 04/02/2017 0815   ALBUMIN 2.5 (L) 03/04/2017 0918   ALBUMIN 3.9 01/22/2015 1559   AST 81 (H) 04/02/2017 0815   AST 34 01/22/2015 1559   ALT 35 04/02/2017 0815   ALT  42 01/22/2015 1559   ALKPHOS 231 (H) 04/02/2017 0815   ALKPHOS 157 (H) 01/22/2015 1559   BILITOT 2.1 (H) 04/02/2017 0815   BILITOT 6.2 (H) 03/04/2017 0918   BILITOT 0.2 (L) 01/22/2015 1559   GFRNONAA >60 04/02/2017 0815   GFRNONAA >60 01/22/2015 1559   GFRAA >60 04/02/2017 0815   GFRAA >60 01/22/2015 1559    No results found for: SPEP, UPEP  Lab Results  Component Value Date   WBC 1.3 (LL) 04/19/2017   NEUTROABS 0.5 (L) 04/02/2017   HGB 10.6 (L) 04/19/2017   HCT 31.0 (L) 04/19/2017   MCV 97.8 04/19/2017   PLT 140 (L) 04/19/2017      Chemistry      Component Value Date/Time   NA 133 (L) 04/02/2017 0815   NA 135 03/04/2017 0918   NA 135 01/08/2015 0850   K 3.4 (L) 04/02/2017 0815   K 3.7 01/08/2015 0850   CL 102 04/02/2017 0815   CL 101 01/08/2015 0850   CO2 25 04/02/2017 0815   CO2 26 01/08/2015 0850   BUN 11 04/02/2017 0815   BUN 8  03/04/2017 0918   BUN 17 01/08/2015 0850   CREATININE 0.85 04/02/2017 0815   CREATININE 0.82 01/22/2015 1559      Component Value Date/Time   CALCIUM 8.2 (L) 04/02/2017 0815   CALCIUM 9.3 01/08/2015 0850   ALKPHOS 231 (H) 04/02/2017 0815   ALKPHOS 157 (H) 01/22/2015 1559   AST 81 (H) 04/02/2017 0815   AST 34 01/22/2015 1559   ALT 35 04/02/2017 0815   ALT 42 01/22/2015 1559   BILITOT 2.1 (H) 04/02/2017 0815   BILITOT 6.2 (H) 03/04/2017 0918   BILITOT 0.2 (L) 01/22/2015 1559     IMPRESSION: Normal left ventricular wall motion with estimated ejection fraction of 65%.   Electronically Signed   By: Marijo Sanes M.D.   On: 07/27/2016 09:04  RADIOGRAPHIC STUDIES: I have personally reviewed the radiological images as listed and agreed with the findings in the report. No results found.   ASSESSMENT & PLAN:  Carcinoma of upper-outer quadrant of left breast in female, estrogen receptor positive ([place]) # RECURRENT Metastatic breast cancer- ER/PR positive HER-2/neu negative [status post liver biopsy February 2018; previously HER-2/neu positive]. Currently on palliative Faslodex+ Abemaciclib. Clinically improving. We will get CT scan before the next visit.  #  HOLD abemacliclib sec to neutropenia [C discussion below]. Clinically response noted- improvement of the LFTs/bilirubin; tumor markers. We will reinitiate abema- at 50 mg once a day at next visit. New prescription written.  # Neutropenia- WBC- 1.3; HOLD abemaciclib for 2 more weeks.   # Liver failure- sec to progressive malignancy- ascites times paracentesis 2.  No evidence of worsening ascites at this time.   # Brain mets- s/p RT [finished Feb 28th]; symptomatic improvement.   MRI May 2018- improved.  # PN G-3 secondary to chemotherapy. Continue Lyrica; oxycodone once a day.  # Hypokalemia- continue K supp.   #  Debility/starting physical therapy tomorrow. needs shower chair.   #  Follow up me in 2 weeks/labs- cbc/cmp  [[re-starting abemaciclib]; CT scan prior. Will start new script for abema. Reviewed the lab work progress with the patient and her brother in detail.   Orders Placed This Encounter  Procedures  . CT ABDOMEN PELVIS W CONTRAST    Standing Status:   Future    Standing Expiration Date:   07/19/2018    Order Specific Question:   Reason for Exam (SYMPTOM  OR DIAGNOSIS REQUIRED)    Answer:   breast cancer; liver mets    Order Specific Question:   Preferred imaging location?    Answer:   [facility]  . Comprehensive metabolic panel    Standing Status:   Future    Standing Expiration Date:   04/19/2018  . Cancer antigen 27.29    Standing Status:   Future    Standing Expiration Date:   04/19/2018   All questions were answered. The patient knows to call the clinic with any problems, questions or concerns.      Cammie Sickle, MD 04/19/2017 12:48 PM

## 2017-04-20 ENCOUNTER — Ambulatory Visit: Payer: BLUE CROSS/BLUE SHIELD | Attending: Internal Medicine

## 2017-04-20 VITALS — BP 123/84 | HR 108

## 2017-04-20 DIAGNOSIS — R262 Difficulty in walking, not elsewhere classified: Secondary | ICD-10-CM | POA: Diagnosis not present

## 2017-04-20 DIAGNOSIS — M6281 Muscle weakness (generalized): Secondary | ICD-10-CM | POA: Diagnosis not present

## 2017-04-20 NOTE — Therapy (Signed)
[facility] [address], [place], [address] Phone: [phone]   Fax:  [phone]  Physical Therapy Evaluation  Patient Details  Name: Sarah Horne MRN: [id number] Date of Birth: [birth year] Referring Provider: Charlaine Dalton, MD  Encounter Date: 04/20/2017      PT End of Session - 04/20/17 1303    Visit Number 1   Number of Visits 13   Date for PT Re-Evaluation 06/03/17   PT Start Time 3532   PT Stop Time 1428   PT Time Calculation (min) 82 min   Equipment Utilized During Treatment Gait belt  rw   Activity Tolerance Patient tolerated treatment well   Behavior During Therapy [facility] for tasks assessed/performed      Past Medical History:  Diagnosis Date  . Chemotherapy-induced peripheral neuropathy ([place])   . Epistaxis    a. 11/2016 in setting of asa/plavix-->silver nitrate cauterization.  . GI bleed    a. 11/2016 Admission w/ GIB and hypovolemic shock req 3u PRBC's;  b. 11/2016 ECG: gastritis & nonbleeding peptic ulcer; c. 11/2016 Conlonoscopy: rectal and sigmoid colonic ulcers.  . Heart attack ([name])    a. 1998 Cath @ [facility]: reportedly no intervention required.  Marland Kitchen Herceptin-induced cardiomyopathy ([place])    a. In the setting of Herceptin Rx for breast cancer (initiated 12/2014); b. 03/2015 MUGA EF 64%; b. 08/2015 MUGA: EF 51%; c. 10/2015 MUGA: EF 44%; d. 11/2015 Echo: EF 45-50%; e. 01/2016 MUGA: EF 60%; f. 06/2016 MUGA EF 65%; g. 10/2016 MUGA: EF 61%;  h. 12/2016 Echo: EF 55-60%, gr1 DD.  Marland Kitchen Hyperlipidemia   . Hypertension   . Neuropathy   . Possible PAD (peripheral artery disease) ([place])    a. 11/2016 LE cyanosis and weak pulses-->CTA w/o significant Ao-BiFem dzs. ? distal dzs-->ASA/Plavix initiated by vascular surgery.  Marland Kitchen PSVT (paroxysmal supraventricular tachycardia) ([place])    a. Dx 11/2016.  . Pulmonary embolism ([place])    a. 12/2016 CTA Chest: small nonocclusive PE in inferior segment of the Left lingula,  somewhat eccentric filling defect suggesting chronic rather than acute embolic event; b. 05/9241 LE U/S:  No DVT; c. 12/2016 Echo: Nl RV fxn, nl PASP.  Marland Kitchen Recurrent Metastatic breast cancer ([place])    a. Dx 2016: Stage II, ER positive, PR positive, HER-2/neu overexpressing of the left breast-->chemo/radiation; b. 10/2016 CT Abd/pelvis: diffuse liver mets, ill defined sclerotic bone lesions-T12;  c. 10/2016 MRI brain: metastatic lesion along L temporal lobe (19x56m) w/ extensive surrounding edema & 552mmidline shift to right.  . Sinus tachycardia     Past Surgical History:  Procedure Laterality Date  . BREAST BIOPSY Left 2016   Positive  . BREAST LUMPECTOMY WITH SENTINEL LYMPH NODE BIOPSY Left 05/23/2015   Procedure: LEFT BREAST WIDE EXCISION WITH AXILLARY DISSECTION, MASTOPLASTY ;  Surgeon: JeRobert BellowMD;  Location: [facility] ORS;  Service: General;  Laterality: Left;  . BREAST SURGERY Left 12/18/14   breast biopsy/INVASIVE DUCTAL CARCINOMA OF BREAST, NOTTINGHAM GRADE 2.  . Marland KitchenREAST SURGERY  05/23/2015.   Wide excision/mastoplasty, axillary dissection. No residual invasive cancer, positive for residual DCIS. 0/2 nodes identified on axillary dissection. (no SLN by technetium or methylene blue)  . CARDIAC CATHETERIZATION    . COLONOSCOPY WITH PROPOFOL N/A 12/10/2016   Procedure: COLONOSCOPY WITH PROPOFOL;  Surgeon: DaLucilla LameMD;  Location: [facility] ENDOSCOPY;  Service: Endoscopy;  Laterality: N/A;  . COLONOSCOPY WITH PROPOFOL N/A 02/13/2017   Procedure: COLONOSCOPY WITH PROPOFOL;  Surgeon:  Lucilla Lame, MD;  Location: [facility];  Service: Endoscopy;  Laterality: N/A;  . ESOPHAGOGASTRODUODENOSCOPY (EGD) WITH PROPOFOL N/A 12/08/2016   Procedure: ESOPHAGOGASTRODUODENOSCOPY (EGD) WITH PROPOFOL;  Surgeon: Lucilla Lame, MD;  Location: [facility] ENDOSCOPY;  Service: Endoscopy;  Laterality: N/A;  . IVC FILTER INSERTION N/A 02/15/2017   Procedure: IVC Filter Insertion;  Surgeon: Algernon Huxley, MD;  Location: [facility];  Service: Cardiovascular;  Laterality: N/A;  . PORTACATH PLACEMENT Right 12-31-14   Dr Bary Castilla    Vitals:   04/20/17 1313  BP: 123/84  Pulse: (!) 108         Subjective Assessment - 04/20/17 1319    Subjective Bilateral hands: 4/10 currently and at worst for the past month. Bilateral feet 5/10 currently and worst for the past month.    Pertinent History Bilateral UE and LE weakness. Pt states that she feels like her weakness came from her neuropathy. Difficulty writing. Both hands feel numb currently but can feel her hand when she touches it. Has had the weakness for about 3 months (since April 2018) which began gradually.  Went through chemo and radiation last year for about 2.5 months until August 2017.  Still has cancer in her L breast which also traveled to her brain and liver.   Pt also lost 10 lbs since she was diagnosed with cancer 2 years ago.  Apetite is fine. Normally eats breakfast at times. Normally eats one good meal during the day and eats junk in between such as cookies and sodas.  Eats a frozen dinner during lunch. Does not know if she gets enough protein.   Only has pain in her feet and hands bilaterally.  No neck or back pain.   Has been using a rw since May 2018.  Was independent with gait before May 2018.  Pt goal is to improve her function.    Patient Stated Goals I want to be able to get out of the walker and not use a cane. Be able to do all the things she used to do such as cooking a meal, laundry,    Currently in Pain? Yes   Pain Score 5   bilateral hands and feet            [facility] PT Assessment - 04/20/17 1255      Assessment   Medical Diagnosis Weakness of both upper and lower extremities   Referring Provider Charlaine Dalton, MD   Onset Date/Surgical Date 12/27/16   Hand Dominance Right   Next MD Visit Pt not sure.    Prior Therapy none for current condition     Precautions   Precaution Comments metastatic cancer     Restrictions    Other Position/Activity Restrictions no known weight bearing retrictions     Balance Screen   Has the patient fallen in the past 6 months No   Has the patient had a decrease in activity level because of a fear of falling?  Yes   Is the patient reluctant to leave their home because of a fear of falling?  No  has a Armed forces training and education officer   Additional Comments lives in a 1 story home with 3 steps to enter, with R rail.   Has a rw, shower bench, bedside commode, SPC     Prior Function   Vocation Requirements PLOF: Walking independently, able to perform chores, cook meals with less difficulty     Observation/Other Assessments   Observations TUG  with rw: 28 seconds, 26 seconds, 22 seconds  25.3 seconds average   Lower Extremity Functional Scale  25/80   Quick DASH  70.45%     Posture/Postural Control   Posture Comments Bilaterally protraced neck and shoulders, R shoulder lower     AROM   Overall AROM Comments B UE WFL     Strength   Right Shoulder Flexion 4/5   Right Shoulder ABduction 4/5   Right Shoulder Internal Rotation 4/5   Right Shoulder External Rotation 4-/5   Left Shoulder Flexion 4/5   Left Shoulder ABduction 4/5   Left Shoulder Internal Rotation 4-/5   Left Shoulder External Rotation 4-/5   Right Elbow Flexion 4-/5   Right Elbow Extension 4/5   Left Elbow Flexion 4-/5   Left Elbow Extension 4/5   Right Hip Flexion 4-/5   Right Hip ABduction 4/5   Left Hip Flexion 4-/5   Left Hip ABduction 4/5   Right Knee Flexion 4-/5   Right Knee Extension 4+/5   Left Knee Flexion 4-/5   Left Knee Extension 4+/5   Right Ankle Dorsiflexion 2-/5  since about 3-4 months ago   Right Ankle Plantar Flexion 4-/5  manual resistance   Left Ankle Dorsiflexion 3/5   Left Ankle Plantar Flexion 4/5  manual resistance     Ambulation/Gait   Gait Comments rw used. foot slap R LE (decreased R ankle DF), decreased stance R LE, decreased bilateral hip extension, decreased gait speed.              Objective measurements completed on examination: See above findings.  SpO2 100% room air Pt also states that she does not do much walking at home. Longest distance she walks is from the parking lot to the clinic    Objectives  There-ex  Sit <> stand 5x with 2 pillows  Bilateral UE assist to stand, no UE assist to sit    Reviewed and given as part of her HEP so long as her chair is back up against the wall for safety and she feels safe to perform exercise. Pt demonstrated and verbalized understanding.   standing hip abduction with bilateral UE assist 10x2 each LE to promote glute med strengthening  Reviewed HEP. Pt demonstrated and verbalized understanding. Handout provided.     Improved exercise technique, movement at target joints, use of target muscles after mod verbal, visual, tactile cues.    Extra time used to help patient fill out LEFS and Quick Dash secondary to pt stating that she needs help with it since she has difficulty writing.   Pt was recommended OT for her hands.          PT Long Term Goals - 04/20/17 1957      PT LONG TERM GOAL #1   Title Patient will be able to perform her TUG time using a rw to 18 seconds or less to promote functional mobility.   Baseline 25.3 seconds average with rw (04/20/2017)   Time 6   Period Weeks   Status New   Target Date 06/03/17     PT LONG TERM GOAL #2   Title Patient will improve bilateral UE and bilateral hip, and knee strength to promote ability to perform functional tasks at home.    Time 6   Period Weeks   Status New   Target Date 06/03/17     PT LONG TERM GOAL #3   Title Patient will improve her LEFS score by at least 9  points as a demonstration of improved function.    Baseline 25/80 (04/20/2017)   Time 6   Period Weeks   Status New   Target Date 06/03/17     PT LONG TERM GOAL #4   Title Patient will improve her Quick Dash Disability/Symptom score by at least 15% as a demonstration of  improved function.    Baseline 70.45% (04/20/2017)   Time 6   Period Weeks   Status New   Target Date 06/03/17                Plan - 04/20/17 1950    Clinical Impression Statement Patient is a 61 year old female who came to physical therapy secondary to bilateral UE and LE weakness. She also presents with altered gait pattern and posture, decreased balance (slow TUG times and uses a rw), decreased endurance and difficulty performing functional tasks such as standing to cook, walking, and performing chores such as laundry. Patient will benefit from skilled physical therapy services to address the aforementioned deficits.    History and Personal Factors relevant to plan of care: Hx of cancer, weakness, multiple health problems   Clinical Presentation Evolving   Clinical Presentation due to: Increasing weakness. Difficulty walking, perform standing tasks such as cooking, chores, laundry   Clinical Decision Making Moderate   Rehab Potential Fair   Clinical Impairments Affecting Rehab Potential hx of CA, multiple health problems   PT Frequency 2x / week   PT Duration 6 weeks   PT Treatment/Interventions Manual techniques;Patient/family education;Therapeutic activities;Therapeutic exercise;Gait training;Neuromuscular re-education;Balance training   PT Next Visit Plan hip, scapular, LE, UE strengthening, gait, balance, femoral control   Consulted and Agree with Plan of Care Patient      Patient will benefit from skilled therapeutic intervention in order to improve the following deficits and impairments:  Pain, Improper body mechanics, Postural dysfunction, Impaired UE functional use, Decreased strength, Difficulty walking, Decreased endurance  Visit Diagnosis: Muscle weakness (generalized) - Plan: PT plan of care cert/re-cert  Difficulty in walking, not elsewhere classified - Plan: PT plan of care cert/re-cert     Problem List Patient Active Problem List   Diagnosis Date Noted   . Blood in stool   . Polyp of sigmoid colon   . Benign neoplasm of transverse colon   . Benign neoplasm of ascending colon   . Lower GI bleed   . Weakness of right lower extremity 02/11/2017  . Metastatic breast cancer ([place]) 01/18/2017  . Orthostatic lightheadedness 01/14/2017  . Atrial tachycardia ([place]) 01/14/2017  . Hypokalemia 01/14/2017  . Pulmonary emboli ([place]) 01/12/2017  . Dehydration 12/25/2016  . Blood loss anemia 12/15/2016  . Ulceration of colon   . Heartburn   . Duodenitis   . Gastritis without bleeding   . GI bleed   . Acute esophagitis   . Acute esophagogastric ulcer   . Rectal bleed   . Hypovolemic shock ([place])   . Ischemic leg 12/04/2016  . Chemotherapy induced neutropenia ([place]) 11/20/2016  . Palliative care by specialist   . Goals of care, counseling/discussion   . DNR (do not resuscitate) discussion   . Cerebral edema ([place]) 11/10/2016  . Encounter for monitoring cardiotoxic drug therapy 11/09/2016  . Elevated LFTs 10/20/2016  . URI with cough and congestion 08/25/2016  . Peripheral neuropathy due to chemotherapy ([place]) 03/13/2016  . Encounter for antineoplastic chemotherapy 03/13/2016  . HLD (hyperlipidemia) 01/29/2016  . Hyperglycemia, unspecified 01/29/2016  . Cardiomyopathy ([place]) 01/13/2016  . Hypertension 07/18/2015  .  Carcinoma of upper-outer quadrant of left breast in female, estrogen receptor positive ([place]) 12/27/2014    Joneen Boers PT, DPT   04/20/2017, 8:19 PM  [facility] [address] [place], [address] Phone: [phone]   Fax:  [phone]  Name: Sarah Horne MRN: [id number] Date of Birth: 1956-05-10

## 2017-04-20 NOTE — Patient Instructions (Addendum)
   Stand up and walk with your rolling walker every hour for about 30 seconds or more as tolerated.    With the chair against the wall and 2 pillows on your chair:   Stand up using your hands   Sit down without using your hands as best as possible.    Keep your thighs at neutral position (do not let them turn in)   Repeat 5 times.    Perform 3 sets daily.       ABDUCTION: Standing (Active)    Holding onto something sturdy for safety. Stand, feet flat. Lift right leg out to side.  Complete _2__ sets of __10_ repetitions. Perform __1_ sessions per day.  Repeat for your left side.   http://gtsc.exer.us/111   Copyright  VHI. All rights reserved.

## 2017-04-22 ENCOUNTER — Ambulatory Visit: Payer: BLUE CROSS/BLUE SHIELD

## 2017-04-22 ENCOUNTER — Telehealth: Payer: Self-pay | Admitting: *Deleted

## 2017-04-22 ENCOUNTER — Other Ambulatory Visit: Payer: Self-pay | Admitting: *Deleted

## 2017-04-22 DIAGNOSIS — R262 Difficulty in walking, not elsewhere classified: Secondary | ICD-10-CM

## 2017-04-22 DIAGNOSIS — M6281 Muscle weakness (generalized): Secondary | ICD-10-CM | POA: Diagnosis not present

## 2017-04-22 DIAGNOSIS — R29898 Other symptoms and signs involving the musculoskeletal system: Secondary | ICD-10-CM

## 2017-04-22 NOTE — Telephone Encounter (Signed)
Patient called requesting an order for OT on her hands for weakness. States PT told her she needs it

## 2017-04-22 NOTE — Telephone Encounter (Signed)
OT orders entered for outpatient rehab

## 2017-04-22 NOTE — Telephone Encounter (Signed)
[facility] for OT per md. Will enter orders

## 2017-04-22 NOTE — Therapy (Signed)
[facility] [address] [place], [address] Phone: [phone]   Fax:  [phone]  Physical Therapy Treatment  Patient Details  Name: Sarah Horne MRN: [id number] Date of Birth: [birth year]  Referring Provider: Charlaine Dalton, MD  Encounter Date: 04/22/2017      PT End of Session - 04/22/17 1437    Visit Number 2   Number of Visits 13   Date for PT Re-Evaluation 06/03/17   PT Start Time 1287   PT Stop Time 1521   PT Time Calculation (min) 43 min   Equipment Utilized During Treatment Gait belt  rw   Activity Tolerance Patient tolerated treatment well   Behavior During Therapy [facility] for tasks assessed/performed      Past Medical History:  Diagnosis Date  . Chemotherapy-induced peripheral neuropathy ([place])   . Epistaxis    a. 11/2016 in setting of asa/plavix-->silver nitrate cauterization.  . GI bleed    a. 11/2016 Admission w/ GIB and hypovolemic shock req 3u PRBC's;  b. 11/2016 ECG: gastritis & nonbleeding peptic ulcer; c. 11/2016 Conlonoscopy: rectal and sigmoid colonic ulcers.  . Heart attack ([place])    a. 1998 Cath @ [facility]: reportedly no intervention required.  Marland Kitchen Herceptin-induced cardiomyopathy ([place])    a. In the setting of Herceptin Rx for breast cancer (initiated 12/2014); b. 03/2015 MUGA EF 64%; b. 08/2015 MUGA: EF 51%; c. 10/2015 MUGA: EF 44%; d. 11/2015 Echo: EF 45-50%; e. 01/2016 MUGA: EF 60%; f. 06/2016 MUGA EF 65%; g. 10/2016 MUGA: EF 61%;  h. 12/2016 Echo: EF 55-60%, gr1 DD.  Marland Kitchen Hyperlipidemia   . Hypertension   . Neuropathy   . Possible PAD (peripheral artery disease) ([place])    a. 11/2016 LE cyanosis and weak pulses-->CTA w/o significant Ao-BiFem dzs. ? distal dzs-->ASA/Plavix initiated by vascular surgery.  Marland Kitchen PSVT (paroxysmal supraventricular tachycardia) ([place])    a. Dx 11/2016.  . Pulmonary embolism ([place])    a. 12/2016 CTA Chest: small nonocclusive PE in inferior segment of the Left lingula,  somewhat eccentric filling defect suggesting chronic rather than acute embolic event; b. 04/6766 LE U/S:  No DVT; c. 12/2016 Echo: Nl RV fxn, nl PASP.  Marland Kitchen Recurrent Metastatic breast cancer ([place])    a. Dx 2016: Stage II, ER positive, PR positive, HER-2/neu overexpressing of the left breast-->chemo/radiation; b. 10/2016 CT Abd/pelvis: diffuse liver mets, ill defined sclerotic bone lesions-T12;  c. 10/2016 MRI brain: metastatic lesion along L temporal lobe (19x80m) w/ extensive surrounding edema & 55mmidline shift to right.  . Sinus tachycardia     Past Surgical History:  Procedure Laterality Date  . BREAST BIOPSY Left 2016   Positive  . BREAST LUMPECTOMY WITH SENTINEL LYMPH NODE BIOPSY Left 05/23/2015   Procedure: LEFT BREAST WIDE EXCISION WITH AXILLARY DISSECTION, MASTOPLASTY ;  Surgeon: JeRobert BellowMD;  Location: [facility] ORS;  Service: General;  Laterality: Left;  . BREAST SURGERY Left 12/18/14   breast biopsy/INVASIVE DUCTAL CARCINOMA OF BREAST, NOTTINGHAM GRADE 2.  . Marland KitchenREAST SURGERY  05/23/2015.   Wide excision/mastoplasty, axillary dissection. No residual invasive cancer, positive for residual DCIS. 0/2 nodes identified on axillary dissection. (no SLN by technetium or methylene blue)  . CARDIAC CATHETERIZATION    . COLONOSCOPY WITH PROPOFOL N/A 12/10/2016   Procedure: COLONOSCOPY WITH PROPOFOL;  Surgeon: DaLucilla LameMD;  Location: [facility] ENDOSCOPY;  Service: Endoscopy;  Laterality: N/A;  . COLONOSCOPY WITH PROPOFOL N/A 02/13/2017   Procedure: COLONOSCOPY WITH PROPOFOL;  Surgeon: Lucilla Lame, MD;  Location[facility] END[facility]COPY;  Service: Endoscopy;  Laterality: N/A;  . ESOPHAGOGASTRODUODENOSCOPY (EGD) WITH PROPOFOL N/A 12/08/2016   Procedure: ESOPHAGOGASTRODUODENOSCOPY (EGD) WITH PROPOFOL;  Surgeon: Lucilla Lame, MD;  Location: [facility] ENDOSCOPY;  Service: Endoscopy;  Laterality: N/A;  . IVC FILTER INSERTION N/A 02/15/2017   Procedure: IVC Filter Insertion;  Surgeon: Algernon Huxley, MD;  Location: [facility] CV LAB;  Service: Cardiovascular;  Laterality: N/A;  . PORTACATH PLACEMENT Right 12-31-14   Dr Bary Castilla    There were no vitals filed for this visit.      Subjective Assessment - 04/22/17 1448    Subjective Pt states that she is doing fine. Has been doing her exercises at home.    Pertinent History Bilateral UE and LE weakness. Pt states that she feels like her weakness came from her neuropathy. Difficulty writing. Both hands feel numb currently but can feel her hand when she touches it. Has had the weakness for about 3 months (since April 2018) which began gradually.  Went through chemo and radiation last year for about 2.5 months until August 2017.  Still has cancer in her L breast which also traveled to her brain and liver.   Pt also lost 10 lbs since she was diagnosed with cancer 2 years ago.  Apetite is fine. Normally eats breakfast at times. Normally eats one good meal during the day and eats junk in between such as cookies and sodas.  Eats a frozen dinner during lunch. Does not know if she gets enough protein.   Only has pain in her feet and hands bilaterally.  No neck or back pain.   Has been using a rw since May 2018.  Was independent with gait before May 2018.  Pt goal is to improve her function.    Patient Stated Goals I want to be able to get out of the walker and not use a cane. Be able to do all the things she used to do such as cooking a meal, laundry,    Currently in Pain? Other (Comment)  no pain other than bilateral hands and feet                                 PT Education - 04/22/17 1449    Education provided Yes   Education Details ther-ex   [employer]) Educated Patient   Methods Explanation;Demonstration;Tactile cues;Verbal cues   Comprehension Returned demonstration;Verbalized understanding       Objectives  There-ex  Blood pressure R arm sitting, mechanically taken: 137/85, HR 98 SpO2 100% room Air  Adjusted pt rw to proper  height. Pt states walker felt better afterwards  Sit <> stand from elevated mat table 5x with emphasis on femoral control  LAQ resisting 2 lbs 10x each L   Then 10x5 seconds each LE  Side step at treadmill bars with bilateral UE assist 3 x  Then on Air Ex Pad 5x each  Then with light touch assist 3x each way. Difficulty with pelvic control   SLS on a LE with opposite tip toe assist 5x2 each LE with 5 second holds  Then without tip toe assist 10x5 seconds each LE  Forward step up onto 3 inch step with one UE assist 5x each   Then onto Air Ex pad 5x each LE  Seated clamshells, hips less than 90 degrees flexion resisting yellow band to promote glute med strengthening for  femoral and pelvic control 10x3    Add standing bilateral shoulder extension resisting yellow band next visit if appropriate     Improved exercise technique, movement at target joints, use of target muscles after min to mod verbal, visual, tactile cues.    Pt demonstrates difficulty with bilateral femoral and pelvic control with sit <> stand and step up exercises Added seated clamshells to help strengthen glute med muscles to help address. Continued working on strengthening to promote ability to perform functional tasks at home. Pt tolerated session well without complain of pain.         PT Long Term Goals - 04/20/17 1957      PT LONG TERM GOAL #1   Title Patient will be able to perform her TUG time using a rw to 18 seconds or less to promote functional mobility.   Baseline 25.3 seconds average with rw (04/20/2017)   Time 6   Period Weeks   Status New   Target Date 06/03/17     PT LONG TERM GOAL #2   Title Patient will improve bilateral UE and bilateral hip, and knee strength to promote ability to perform functional tasks at home.    Time 6   Period Weeks   Status New   Target Date 06/03/17     PT LONG TERM GOAL #3   Title Patient will improve her LEFS score by at least 9 points as a demonstration of  improved function.    Baseline 25/80 (04/20/2017)   Time 6   Period Weeks   Status New   Target Date 06/03/17     PT LONG TERM GOAL #4   Title Patient will improve her Quick Dash Disability/Symptom score by at least 15% as a demonstration of improved function.    Baseline 70.45% (04/20/2017)   Time 6   Period Weeks   Status New   Target Date 06/03/17               Plan - 04/22/17 1449    Clinical Impression Statement Pt demonstrates difficulty with bilateral femoral and pelvic control with sit <> stand and step up exercises Added seated clamshells to help strengthen glute med muscles to help address. Continued working on strengthening to promote ability to perform functional tasks at home. Pt tolerated session well without complain of pain.    History and Personal Factors relevant to plan of care: Hx of cancer, weakness, multiple health problems.    Clinical Presentation Stable   Clinical Presentation due to: Increasing weakness. Difficulty walking, performing standign tasks such as cooking, chores, laundry.    Clinical Decision Making Low   Rehab Potential Fair   Clinical Impairments Affecting Rehab Potential hx of CA, multiple health problems   PT Frequency 2x / week   PT Duration 6 weeks   PT Treatment/Interventions Manual techniques;Patient/family education;Therapeutic activities;Therapeutic exercise;Gait training;Neuromuscular re-education;Balance training   PT Next Visit Plan hip, scapular, LE, UE strengthening, gait, balance, femoral control   Consulted and Agree with Plan of Care Patient      Patient will benefit from skilled therapeutic intervention in order to improve the following deficits and impairments:  Pain, Improper body mechanics, Postural dysfunction, Impaired UE functional use, Decreased strength, Difficulty walking, Decreased endurance  Visit Diagnosis: Muscle weakness (generalized)  Difficulty in walking, not elsewhere classified     Problem  List Patient Active Problem List   Diagnosis Date Noted  . Blood in stool   . Polyp of sigmoid colon   .  Benign neoplasm of transverse colon   . Benign neoplasm of ascending colon   . Lower GI bleed   . Weakness of right lower extremity 02/11/2017  . Metastatic breast cancer ([place]) 01/18/2017  . Orthostatic lightheadedness 01/14/2017  . Atrial tachycardia ([place]) 01/14/2017  . Hypokalemia 01/14/2017  . Pulmonary emboli ([place]) 01/12/2017  . Dehydration 12/25/2016  . Blood loss anemia 12/15/2016  . Ulceration of colon   . Heartburn   . Duodenitis   . Gastritis without bleeding   . GI bleed   . Acute esophagitis   . Acute esophagogastric ulcer   . Rectal bleed   . Hypovolemic shock ([place])   . Ischemic leg 12/04/2016  . Chemotherapy induced neutropenia ([place]) 11/20/2016  . Palliative care by specialist   . Goals of care, counseling/discussion   . DNR (do not resuscitate) discussion   . Cerebral edema ([place]) 11/10/2016  . Encounter for monitoring cardiotoxic drug therapy 11/09/2016  . Elevated LFTs 10/20/2016  . URI with cough and congestion 08/25/2016  . Peripheral neuropathy due to chemotherapy ([place]) 03/13/2016  . Encounter for antineoplastic chemotherapy 03/13/2016  . HLD (hyperlipidemia) 01/29/2016  . Hyperglycemia, unspecified 01/29/2016  . Cardiomyopathy ([place]) 01/13/2016  . Hypertension 07/18/2015  . Carcinoma of upper-outer quadrant of left breast in female, estrogen receptor positive ([place]) 12/27/2014   Joneen Boers PT, DPT   04/22/2017, 3:36 PM  [facility] [address] [place], [address] Phone: [phone]   Fax:  [phone]  Name: Sarah Horne MRN: [id number] Date of Birth: 1956/03/05

## 2017-04-23 DIAGNOSIS — R262 Difficulty in walking, not elsewhere classified: Secondary | ICD-10-CM | POA: Diagnosis not present

## 2017-04-23 DIAGNOSIS — M6281 Muscle weakness (generalized): Secondary | ICD-10-CM | POA: Diagnosis not present

## 2017-04-26 ENCOUNTER — Telehealth: Payer: Self-pay | Admitting: *Deleted

## 2017-04-26 MED ORDER — OXYCODONE HCL 5 MG PO TABS: 5.0000 mg | ORAL_TABLET | Freq: Three times a day (TID) | ORAL | 0 refills | Status: DC | PRN

## 2017-04-26 NOTE — Telephone Encounter (Signed)
Patient notified that requested prescription was at the front desk. She was also advised that the OT department would be calling her for to make an appointment.

## 2017-04-27 ENCOUNTER — Ambulatory Visit: Payer: BLUE CROSS/BLUE SHIELD

## 2017-04-27 DIAGNOSIS — R262 Difficulty in walking, not elsewhere classified: Secondary | ICD-10-CM | POA: Diagnosis not present

## 2017-04-27 DIAGNOSIS — M6281 Muscle weakness (generalized): Secondary | ICD-10-CM | POA: Diagnosis not present

## 2017-04-27 NOTE — Therapy (Signed)
[facility] [address], [place], [address] Phone: [phone]   Fax:  [phone]  Physical Therapy Treatment  Patient Details  Name: Sarah Horne MRN: [id number] Date of Birth: Mar 05, 1956 Referring Provider: Charlaine Dalton, MD  Encounter Date: 04/27/2017      PT End of Session - 04/27/17 1422    Visit Number 3   Number of Visits 13   Date for PT Re-Evaluation 06/03/17   PT Start Time 4174   PT Stop Time 1508   PT Time Calculation (min) 46 min   Equipment Utilized During Treatment Gait belt  rw, SPC   Activity Tolerance Patient tolerated treatment well   Behavior During Therapy [facility] for tasks assessed/performed      Past Medical History:  Diagnosis Date  . Chemotherapy-induced peripheral neuropathy ([place])   . Epistaxis    a. 11/2016 in setting of asa/plavix-->silver nitrate cauterization.  . GI bleed    a. 11/2016 Admission w/ GIB and hypovolemic shock req 3u PRBC's;  b. 11/2016 ECG: gastritis & nonbleeding peptic ulcer; c. 11/2016 Conlonoscopy: rectal and sigmoid colonic ulcers.  . Heart attack ([place])    a. 1998 Cath @ [facility]: reportedly no intervention required.  Marland Kitchen Herceptin-induced cardiomyopathy ([place])    a. In the setting of Herceptin Rx for breast cancer (initiated 12/2014); b. 03/2015 MUGA EF 64%; b. 08/2015 MUGA: EF 51%; c. 10/2015 MUGA: EF 44%; d. 11/2015 Echo: EF 45-50%; e. 01/2016 MUGA: EF 60%; f. 06/2016 MUGA EF 65%; g. 10/2016 MUGA: EF 61%;  h. 12/2016 Echo: EF 55-60%, gr1 DD.  Marland Kitchen Hyperlipidemia   . Hypertension   . Neuropathy   . Possible PAD (peripheral artery disease) ([place])    a. 11/2016 LE cyanosis and weak pulses-->CTA w/o significant Ao-BiFem dzs. ? distal dzs-->ASA/Plavix initiated by vascular surgery.  Marland Kitchen PSVT (paroxysmal supraventricular tachycardia) ([place])    a. Dx 11/2016.  . Pulmonary embolism ([place])    a. 12/2016 CTA Chest: small nonocclusive PE in inferior segment of the Left lingula,  somewhat eccentric filling defect suggesting chronic rather than acute embolic event; b. 0/8144 LE U/S:  No DVT; c. 12/2016 Echo: Nl RV fxn, nl PASP.  Marland Kitchen Recurrent Metastatic breast cancer ([place])    a. Dx 2016: Stage II, ER positive, PR positive, HER-2/neu overexpressing of the left breast-->chemo/radiation; b. 10/2016 CT Abd/pelvis: diffuse liver mets, ill defined sclerotic bone lesions-T12;  c. 10/2016 MRI brain: metastatic lesion along L temporal lobe (19x80m) w/ extensive surrounding edema & 53mmidline shift to right.  . Sinus tachycardia     Past Surgical History:  Procedure Laterality Date  . BREAST BIOPSY Left 2016   Positive  . BREAST LUMPECTOMY WITH SENTINEL LYMPH NODE BIOPSY Left 05/23/2015   Procedure: LEFT BREAST WIDE EXCISION WITH AXILLARY DISSECTION, MASTOPLASTY ;  Surgeon: JeRobert BellowMD;  Location: [facility] ORS;  Service: General;  Laterality: Left;  . BREAST SURGERY Left 12/18/14   breast biopsy/INVASIVE DUCTAL CARCINOMA OF BREAST, NOTTINGHAM GRADE 2.  . Marland KitchenREAST SURGERY  05/23/2015.   Wide excision/mastoplasty, axillary dissection. No residual invasive cancer, positive for residual DCIS. 0/2 nodes identified on axillary dissection. (no SLN by technetium or methylene blue)  . CARDIAC CATHETERIZATION    . COLONOSCOPY WITH PROPOFOL N/A 12/10/2016   Procedure: COLONOSCOPY WITH PROPOFOL;  Surgeon: DaLucilla LameMD;  Location: [facility] ENDOSCOPY;  Service: Endoscopy;  Laterality: N/A;  . COLONOSCOPY WITH PROPOFOL N/A 02/13/2017   Procedure: COLONOSCOPY WITH PROPOFOL;  Surgeon: Lucilla Lame, MD;  Location: [facility] ENDOSCOPY;  Service: Endoscopy;  Laterality: N/A;  . ESOPHAGOGASTRODUODENOSCOPY (EGD) WITH PROPOFOL N/A 12/08/2016   Procedure: ESOPHAGOGASTRODUODENOSCOPY (EGD) WITH PROPOFOL;  Surgeon: Lucilla Lame, MD;  Location: [facility] ENDOSCOPY;  Service: Endoscopy;  Laterality: N/A;  . IVC FILTER INSERTION N/A 02/15/2017   Procedure: IVC Filter Insertion;  Surgeon: Algernon Huxley, MD;  Location: [facility] CV LAB;  Service: Cardiovascular;  Laterality: N/A;  . PORTACATH PLACEMENT Right 12-31-14   Dr Bary Castilla    There were no vitals filed for this visit.      Subjective Assessment - 04/27/17 1427    Subjective Pt states that she is doing fine. Still doing her HEP. No pain except at the hands and feet.    Pertinent History Bilateral UE and LE weakness. Pt states that she feels like her weakness came from her neuropathy. Difficulty writing. Both hands feel numb currently but can feel her hand when she touches it. Has had the weakness for about 3 months (since April 2018) which began gradually.  Went through chemo and radiation last year for about 2.5 months until August 2017.  Still has cancer in her L breast which also traveled to her brain and liver.   Pt also lost 10 lbs since she was diagnosed with cancer 2 years ago.  Apetite is fine. Normally eats breakfast at times. Normally eats one good meal during the day and eats junk in between such as cookies and sodas.  Eats a frozen dinner during lunch. Does not know if she gets enough protein.   Only has pain in her feet and hands bilaterally.  No neck or back pain.   Has been using a rw since May 2018.  Was independent with gait before May 2018.  Pt goal is to improve her function.    Patient Stated Goals I want to be able to get out of the walker and not use a cane. Be able to do all the things she used to do such as cooking a meal, laundry,    Currently in Pain? Other (Comment)  bilateral hands and feet                                 PT Education - 04/27/17 1440    Education provided Yes   Education Details ther-ex, HEP   Person(s) Educated Patient   Methods Explanation;Demonstration;Tactile cues;Verbal cues;Handout   Comprehension Returned demonstration;Verbalized understanding         Objectives  There-ex  Blood pressure R arm sitting, mechanically taken: 127/80, HR 104 SpO2 100% room Air   LAQ  resisting 2 lbs 10x5 seconds for 2 set each LE  Sitting on dyna-disc x 1 min to promote core muscle activation  Then with low rows resisting yellow band 10x3 with 5 second holds   Standing mini squats with bilateral UE assist with yellow band resisting hip abduction/ER 10x  Yellow T-band side step at treadmill bars 10x each side with bilateral UE assist Standing hip flexion with yellow band resisting hip abduction/ER 10x2 each LE with R UE assist   Gait with one UE assist at treadmill bars each side 3x. No LOB. Very slight decreased R LE coordination Gait with SPC on L side 30 ft with CGA and gait belt Gait with SPC on R side 20 ft, then 100 ft SBA. Cues for increasing hip and knee flexion for foot clearance.  Pt better able to perform compared to initial eval and seems more energetic.   Forward step up 10x each LE with one UE assist using 3 inch step   Improved exercise technique, movement at target joints, use of target muscles after min to mod verbal, visual, tactile cues.   Able to ambulate with SPC on L side well today, being able to walk up to 100 ft without LOB. Cues for increasing R hip flexion to promote better foot clearance due to R foot drop. Continued working on bilateral LE strength and trunk muscle use to promote ability to perform functional tasks at home.          PT Long Term Goals - 04/20/17 1957      PT LONG TERM GOAL #1   Title Patient will be able to perform her TUG time using a rw to 18 seconds or less to promote functional mobility.   Baseline 25.3 seconds average with rw (04/20/2017)   Time 6   Period Weeks   Status New   Target Date 06/03/17     PT LONG TERM GOAL #2   Title Patient will improve bilateral UE and bilateral hip, and knee strength to promote ability to perform functional tasks at home.    Time 6   Period Weeks   Status New   Target Date 06/03/17     PT LONG TERM GOAL #3   Title Patient will improve her LEFS score by at least 9 points  as a demonstration of improved function.    Baseline 25/80 (04/20/2017)   Time 6   Period Weeks   Status New   Target Date 06/03/17     PT LONG TERM GOAL #4   Title Patient will improve her Quick Dash Disability/Symptom score by at least 15% as a demonstration of improved function.    Baseline 70.45% (04/20/2017)   Time 6   Period Weeks   Status New   Target Date 06/03/17               Plan - 04/27/17 1421    Clinical Impression Statement Able to ambulate with SPC on L side well today, being able to walk up to 100 ft without LOB. Cues for increasing R hip flexion to promote better foot clearance due to R foot drop. Continued working on bilateral LE strength and trunk muscle use to promote ability to perform functional tasks at home.    History and Personal Factors relevant to plan of care: Hx of cancer, weakness, multiple health problems.    Clinical Presentation Stable   Clinical Presentation due to: able to ambulate with SPC on L side without LOB.    Clinical Decision Making Low   Rehab Potential Fair   Clinical Impairments Affecting Rehab Potential hx of CA, multiple health problems   PT Frequency 2x / week   PT Duration 6 weeks   PT Treatment/Interventions Manual techniques;Patient/family education;Therapeutic activities;Therapeutic exercise;Gait training;Neuromuscular re-education;Balance training   PT Next Visit Plan hip, scapular, LE, UE strengthening, gait, balance, femoral control   Consulted and Agree with Plan of Care Patient      Patient will benefit from skilled therapeutic intervention in order to improve the following deficits and impairments:  Pain, Improper body mechanics, Postural dysfunction, Impaired UE functional use, Decreased strength, Difficulty walking, Decreased endurance  Visit Diagnosis: Muscle weakness (generalized)  Difficulty in walking, not elsewhere classified     Problem List Patient Active Problem List   Diagnosis Date Noted  .  Blood in stool   . Polyp of sigmoid colon   . Benign neoplasm of transverse colon   . Benign neoplasm of ascending colon   . Lower GI bleed   . Weakness of right lower extremity 02/11/2017  . Metastatic breast cancer ([place]) 01/18/2017  . Orthostatic lightheadedness 01/14/2017  . Atrial tachycardia ([place]) 01/14/2017  . Hypokalemia 01/14/2017  . Pulmonary emboli ([place]) 01/12/2017  . Dehydration 12/25/2016  . Blood loss anemia 12/15/2016  . Ulceration of colon   . Heartburn   . Duodenitis   . Gastritis without bleeding   . GI bleed   . Acute esophagitis   . Acute esophagogastric ulcer   . Rectal bleed   . Hypovolemic shock ([place])   . Ischemic leg 12/04/2016  . Chemotherapy induced neutropenia ([place]) 11/20/2016  . Palliative care by specialist   . Goals of care, counseling/discussion   . DNR (do not resuscitate) discussion   . Cerebral edema ([place]) 11/10/2016  . Encounter for monitoring cardiotoxic drug therapy 11/09/2016  . Elevated LFTs 10/20/2016  . URI with cough and congestion 08/25/2016  . Peripheral neuropathy due to chemotherapy ([place]) 03/13/2016  . Encounter for antineoplastic chemotherapy 03/13/2016  . HLD (hyperlipidemia) 01/29/2016  . Hyperglycemia, unspecified 01/29/2016  . Cardiomyopathy ([place]) 01/13/2016  . Hypertension 07/18/2015  . Carcinoma of upper-outer quadrant of left breast in female, estrogen receptor positive ([place]) 12/27/2014    Joneen Boers PT, DPT   04/27/2017, 4:27 PM  [facility] [address] [place], [address] Phone: [phone]   Fax:  [phone]  Name: Sarah Horne MRN: [id number] Date of Birth: 01-26-1956

## 2017-04-27 NOTE — Patient Instructions (Signed)
  Seated low rows  Sitting on a chair in front of your closet doorknob   Pull the yellow band to your belly button with both hands   Hold for 5 seconds   Repeat 10 times    Perform 3 sets daily.

## 2017-04-29 ENCOUNTER — Ambulatory Visit: Payer: BLUE CROSS/BLUE SHIELD | Attending: Internal Medicine

## 2017-04-29 DIAGNOSIS — G620 Drug-induced polyneuropathy: Secondary | ICD-10-CM | POA: Diagnosis not present

## 2017-04-29 DIAGNOSIS — M79641 Pain in right hand: Secondary | ICD-10-CM | POA: Insufficient documentation

## 2017-04-29 DIAGNOSIS — M6281 Muscle weakness (generalized): Secondary | ICD-10-CM | POA: Insufficient documentation

## 2017-04-29 DIAGNOSIS — M79642 Pain in left hand: Secondary | ICD-10-CM | POA: Diagnosis not present

## 2017-04-29 DIAGNOSIS — T451X5A Adverse effect of antineoplastic and immunosuppressive drugs, initial encounter: Secondary | ICD-10-CM | POA: Insufficient documentation

## 2017-04-29 DIAGNOSIS — R262 Difficulty in walking, not elsewhere classified: Secondary | ICD-10-CM | POA: Diagnosis not present

## 2017-04-29 NOTE — Therapy (Signed)
[place] [facility] [address], [place], [address] Phone: [phone]   Fax:  [phone]  Physical Therapy Treatment  Patient Details  Name: Sarah Horne MRN: [id number] Date of Birth: [birth year] Referring Provider: Charlaine Dalton, MD  Encounter Date: 04/29/2017      PT End of Session - 04/29/17 1347    Visit Number 4   Number of Visits 13   Date for PT Re-Evaluation 06/03/17   PT Start Time 5093   PT Stop Time 1434   PT Time Calculation (min) 46 min   Equipment Utilized During Treatment Gait belt  rw, SPC   Activity Tolerance Patient tolerated treatment well   Behavior During Therapy [facility] for tasks assessed/performed      Past Medical History:  Diagnosis Date  . Chemotherapy-induced peripheral neuropathy ([place])   . Epistaxis    a. 11/2016 in setting of asa/plavix-->silver nitrate cauterization.  . GI bleed    a. 11/2016 Admission w/ GIB and hypovolemic shock req 3u PRBC's;  b. 11/2016 ECG: gastritis & nonbleeding peptic ulcer; c. 11/2016 Conlonoscopy: rectal and sigmoid colonic ulcers.  . Heart attack ([place])    a. 1998 Cath @ [facility]: reportedly no intervention required.  Marland Kitchen Herceptin-induced cardiomyopathy ([place])    a. In the setting of Herceptin Rx for breast cancer (initiated 12/2014); b. 03/2015 MUGA EF 64%; b. 08/2015 MUGA: EF 51%; c. 10/2015 MUGA: EF 44%; d. 11/2015 Echo: EF 45-50%; e. 01/2016 MUGA: EF 60%; f. 06/2016 MUGA EF 65%; g. 10/2016 MUGA: EF 61%;  h. 12/2016 Echo: EF 55-60%, gr1 DD.  Marland Kitchen Hyperlipidemia   . Hypertension   . Neuropathy   . Possible PAD (peripheral artery disease) ([place])    a. 11/2016 LE cyanosis and weak pulses-->CTA w/o significant Ao-BiFem dzs. ? distal dzs-->ASA/Plavix initiated by vascular surgery.  Marland Kitchen PSVT (paroxysmal supraventricular tachycardia) ([place])    a. Dx 11/2016.  . Pulmonary embolism ([place])    a. 12/2016 CTA Chest: small nonocclusive PE in inferior segment of the Left lingula,  somewhat eccentric filling defect suggesting chronic rather than acute embolic event; b. 10/6710 LE U/S:  No DVT; c. 12/2016 Echo: Nl RV fxn, nl PASP.  Marland Kitchen Recurrent Metastatic breast cancer ([place])    a. Dx 2016: Stage II, ER positive, PR positive, HER-2/neu overexpressing of the left breast-->chemo/radiation; b. 10/2016 CT Abd/pelvis: diffuse liver mets, ill defined sclerotic bone lesions-T12;  c. 10/2016 MRI brain: metastatic lesion along L temporal lobe (19x82m) w/ extensive surrounding edema & 535mmidline shift to right.  . Sinus tachycardia     Past Surgical History:  Procedure Laterality Date  . BREAST BIOPSY Left 2016   Positive  . BREAST LUMPECTOMY WITH SENTINEL LYMPH NODE BIOPSY Left 05/23/2015   Procedure: LEFT BREAST WIDE EXCISION WITH AXILLARY DISSECTION, MASTOPLASTY ;  Surgeon: JeRobert BellowMD;  Location: [facility] ORS;  Service: General;  Laterality: Left;  . BREAST SURGERY Left 12/18/14   breast biopsy/INVASIVE DUCTAL CARCINOMA OF BREAST, NOTTINGHAM GRADE 2.  . Marland KitchenREAST SURGERY  05/23/2015.   Wide excision/mastoplasty, axillary dissection. No residual invasive cancer, positive for residual DCIS. 0/2 nodes identified on axillary dissection. (no SLN by technetium or methylene blue)  . CARDIAC CATHETERIZATION    . COLONOSCOPY WITH PROPOFOL N/A 12/10/2016   Procedure: COLONOSCOPY WITH PROPOFOL;  Surgeon: DaLucilla LameMD;  Location: [facility] ENDOSCOPY;  Service: Endoscopy;  Laterality: N/A;  . COLONOSCOPY WITH PROPOFOL N/A 02/13/2017   Procedure: COLONOSCOPY WITH PROPOFOL;  Surgeon: Lucilla Lame, MD;  Location: [facility] ENDOSCOPY;  Service: Endoscopy;  Laterality: N/A;  . ESOPHAGOGASTRODUODENOSCOPY (EGD) WITH PROPOFOL N/A 12/08/2016   Procedure: ESOPHAGOGASTRODUODENOSCOPY (EGD) WITH PROPOFOL;  Surgeon: Lucilla Lame, MD;  Location: [facility] ENDOSCOPY;  Service: Endoscopy;  Laterality: N/A;  . IVC FILTER INSERTION N/A 02/15/2017   Procedure: IVC Filter Insertion;  Surgeon: Algernon Huxley, MD;  Location: [place] CV LAB;  Service: Cardiovascular;  Laterality: N/A;  . PORTACATH PLACEMENT Right 12-31-14   Dr Bary Castilla    There were no vitals filed for this visit.      Subjective Assessment - 04/29/17 1350    Subjective Feeling fine.  No pain except at the hands and feet.    Pertinent History Bilateral UE and LE weakness. Pt states that she feels like her weakness came from her neuropathy. Difficulty writing. Both hands feel numb currently but can feel her hand when she touches it. Has had the weakness for about 3 months (since April 2018) which began gradually.  Went through chemo and radiation last year for about 2.5 months until August 2017.  Still has cancer in her L breast which also traveled to her brain and liver.   Pt also lost 10 lbs since she was diagnosed with cancer 2 years ago.  Apetite is fine. Normally eats breakfast at times. Normally eats one good meal during the day and eats junk in between such as cookies and sodas.  Eats a frozen dinner during lunch. Does not know if she gets enough protein.   Only has pain in her feet and hands bilaterally.  No neck or back pain.   Has been using a rw since May 2018.  Was independent with gait before May 2018.  Pt goal is to improve her function.    Patient Stated Goals I want to be able to get out of the walker and not use a cane. Be able to do all the things she used to do such as cooking a meal, laundry,    Currently in Pain? Other (Comment)  no pain except at hands and feet                                 PT Education - 04/29/17 1356    Education provided Yes   Education Details ther-ex   [employer]) Educated Patient   Methods Explanation;Demonstration;Tactile cues;Verbal cues   Comprehension Returned demonstration;Verbalized understanding        Objectives  There-ex  Blood pressure R arm sitting, mechanically taken: 118/83, HR 100 SpO2 100% room Air  Sitting on dyna-disc x 1 min to promote core muscle  activation             Then with low rows resisting yellow band 10x3 with 5 second holds   SLS with bilateral UE assist 5x5 second for 3 sets each LE to promote glute med strength   Gait with SPC on R side 200 ft, CGA to SBA  Ladder drill slow   Lateral in and out 4 squares to the L   2 squares to the R. Difficulty picking up L LE when on R LE stance moving laterally to the R  Standing toe taps onto 3 balance stones with one UE light touch assist. Stance foot on flat surface. Performed for each LE.   Forward step up onto Air Ex pad with light touch assist 10x each LE  Standing heel toe  raises with bilateral UE assist, standing on air ex pad 5x 2 each way.   Use of SPC on R side stepping over a fallen quad cane. 1x stepping over with L LE. Able perform. Unable to perform with stepping over with  R LE. Pt states feeling more comfortable using the [place] on her R side instead of using her L hand.   Try gait training with SPC on L hand next visit if appropriate.    Improved exercise technique, movement at target joints, use of target muscles after min to mod verbal, visual, tactile cues.     Continued working on gentle LE strengthening and added more balance activities today to promote function at home. Pt demonstrates difficulty balancing on her R LE to pick up her L foot to negotiate obstacles. Pt tolerated session well without aggravation of symptoms.           PT Long Term Goals - 04/20/17 1957      PT LONG TERM GOAL #1   Title Patient will be able to perform her TUG time using a rw to 18 seconds or less to promote functional mobility.   Baseline 25.3 seconds average with rw (04/20/2017)   Time 6   Period Weeks   Status New   Target Date 06/03/17     PT LONG TERM GOAL #2   Title Patient will improve bilateral UE and bilateral hip, and knee strength to promote ability to perform functional tasks at home.    Time 6   Period Weeks   Status New   Target Date 06/03/17      PT LONG TERM GOAL #3   Title Patient will improve her LEFS score by at least 9 points as a demonstration of improved function.    Baseline 25/80 (04/20/2017)   Time 6   Period Weeks   Status New   Target Date 06/03/17     PT LONG TERM GOAL #4   Title Patient will improve her Quick Dash Disability/Symptom score by at least 15% as a demonstration of improved function.    Baseline 70.45% (04/20/2017)   Time 6   Period Weeks   Status New   Target Date 06/03/17               Plan - 04/29/17 1347    Clinical Impression Statement Continued working on gentle LE strengthening and added more balance activities today to promote function at home. Pt demonstrates difficulty balancing on her R LE to pick up her L foot to negotiate obstacles. Pt tolerated session well without aggravation of symptoms.    History and Personal Factors relevant to plan of care: Hx of cancer, weakness, multiple health problems.    Clinical Presentation Stable   Clinical Presentation due to: able to ambulate with SPC on L side    Clinical Decision Making Low   Rehab Potential Fair   Clinical Impairments Affecting Rehab Potential hx of CA, multiple health problems   PT Frequency 2x / week   PT Duration 6 weeks   PT Treatment/Interventions Manual techniques;Patient/family education;Therapeutic activities;Therapeutic exercise;Gait training;Neuromuscular re-education;Balance training   PT Next Visit Plan hip, scapular, LE, UE strengthening, gait, balance, femoral control   Consulted and Agree with Plan of Care Patient      Patient will benefit from skilled therapeutic intervention in order to improve the following deficits and impairments:  Pain, Improper body mechanics, Postural dysfunction, Impaired UE functional use, Decreased strength, Difficulty walking, Decreased endurance  Visit Diagnosis:  Muscle weakness (generalized)  Difficulty in walking, not elsewhere classified     Problem List Patient Active  Problem List   Diagnosis Date Noted  . Blood in stool   . Polyp of sigmoid colon   . Benign neoplasm of transverse colon   . Benign neoplasm of ascending colon   . Lower GI bleed   . Weakness of right lower extremity 02/11/2017  . Metastatic breast cancer ([place]) 01/18/2017  . Orthostatic lightheadedness 01/14/2017  . Atrial tachycardia ([place]) 01/14/2017  . Hypokalemia 01/14/2017  . Pulmonary emboli ([place]) 01/12/2017  . Dehydration 12/25/2016  . Blood loss anemia 12/15/2016  . Ulceration of colon   . Heartburn   . Duodenitis   . Gastritis without bleeding   . GI bleed   . Acute esophagitis   . Acute esophagogastric ulcer   . Rectal bleed   . Hypovolemic shock ([place])   . Ischemic leg 12/04/2016  . Chemotherapy induced neutropenia ([place]) 11/20/2016  . Palliative care by specialist   . Goals of care, counseling/discussion   . DNR (do not resuscitate) discussion   . Cerebral edema ([place]) 11/10/2016  . Encounter for monitoring cardiotoxic drug therapy 11/09/2016  . Elevated LFTs 10/20/2016  . URI with cough and congestion 08/25/2016  . Peripheral neuropathy due to chemotherapy ([place]) 03/13/2016  . Encounter for antineoplastic chemotherapy 03/13/2016  . HLD (hyperlipidemia) 01/29/2016  . Hyperglycemia, unspecified 01/29/2016  . Cardiomyopathy ([place]) 01/13/2016  . Hypertension 07/18/2015  . Carcinoma of upper-outer quadrant of left breast in female, estrogen receptor positive ([place]) 12/27/2014    Joneen Boers PT, DPT   04/29/2017, 6:29 PM  [place] [facility] [address], [place], [address] Phone: [phone]   Fax:  [phone]  Name: Sarah Horne MRN: [id number] Date of Birth: 11-22-1955

## 2017-05-03 ENCOUNTER — Encounter: Payer: Self-pay | Admitting: Occupational Therapy

## 2017-05-03 ENCOUNTER — Ambulatory Visit
Admission: RE | Admit: 2017-05-03 | Discharge: 2017-05-03 | Disposition: A | Discharge status: Home / Self Care | Payer: BLUE CROSS/BLUE SHIELD | Source: Ambulatory Visit | Attending: Internal Medicine | Admitting: Internal Medicine

## 2017-05-03 ENCOUNTER — Ambulatory Visit: Payer: BLUE CROSS/BLUE SHIELD

## 2017-05-03 ENCOUNTER — Ambulatory Visit: Payer: BLUE CROSS/BLUE SHIELD | Admitting: Occupational Therapy

## 2017-05-03 DIAGNOSIS — R188 Other ascites: Secondary | ICD-10-CM | POA: Diagnosis not present

## 2017-05-03 DIAGNOSIS — M6281 Muscle weakness (generalized): Secondary | ICD-10-CM

## 2017-05-03 DIAGNOSIS — I700 Atherosclerosis of aorta: Secondary | ICD-10-CM | POA: Diagnosis not present

## 2017-05-03 DIAGNOSIS — C50412 Malignant neoplasm of upper-outer quadrant of left female breast: Secondary | ICD-10-CM | POA: Insufficient documentation

## 2017-05-03 DIAGNOSIS — M79641 Pain in right hand: Secondary | ICD-10-CM | POA: Diagnosis not present

## 2017-05-03 DIAGNOSIS — M79642 Pain in left hand: Secondary | ICD-10-CM | POA: Diagnosis not present

## 2017-05-03 DIAGNOSIS — R262 Difficulty in walking, not elsewhere classified: Secondary | ICD-10-CM | POA: Diagnosis not present

## 2017-05-03 DIAGNOSIS — T451X5A Adverse effect of antineoplastic and immunosuppressive drugs, initial encounter: Secondary | ICD-10-CM | POA: Diagnosis not present

## 2017-05-03 DIAGNOSIS — Z170 Estrogen receptor positive status [ER+]: Secondary | ICD-10-CM | POA: Diagnosis not present

## 2017-05-03 DIAGNOSIS — C787 Secondary malignant neoplasm of liver and intrahepatic bile duct: Secondary | ICD-10-CM | POA: Insufficient documentation

## 2017-05-03 DIAGNOSIS — G620 Drug-induced polyneuropathy: Secondary | ICD-10-CM | POA: Diagnosis not present

## 2017-05-03 MED ORDER — IOPAMIDOL (ISOVUE-300) INJECTION 61%
75.0000 mL | Freq: Once | INTRAVENOUS | Status: AC | PRN
  Administered 2017-05-03: 75 mL via INTRAVENOUS

## 2017-05-03 NOTE — Therapy (Signed)
[place] [facility] [address] [place], [address] Phone: [phone]   Fax:  [phone]  Physical Therapy Treatment  Patient Details  Name: Sarah Horne MRN: [id number] Date of Birth: May 02, 1956 Referring Provider: Charlaine Dalton, MD  Encounter Date: 05/03/2017      PT End of Session - 05/03/17 1605    Visit Number 5   Number of Visits 13   Date for PT Re-Evaluation 06/03/17   PT Start Time 8206   PT Stop Time 0156   PT Time Calculation (min) 42 min   Equipment Utilized During Treatment Gait belt  rw, SPC   Activity Tolerance Patient tolerated treatment well   Behavior During Therapy [facility] for tasks assessed/performed      Past Medical History:  Diagnosis Date  . Chemotherapy-induced peripheral neuropathy ([place])   . Epistaxis    a. 11/2016 in setting of asa/plavix-->silver nitrate cauterization.  . GI bleed    a. 11/2016 Admission w/ GIB and hypovolemic shock req 3u PRBC's;  b. 11/2016 ECG: gastritis & nonbleeding peptic ulcer; c. 11/2016 Conlonoscopy: rectal and sigmoid colonic ulcers.  . Heart attack ([place])    a. 1998 Cath @ [facility]: reportedly no intervention required.  Marland Kitchen Herceptin-induced cardiomyopathy ([place])    a. In the setting of Herceptin Rx for breast cancer (initiated 12/2014); b. 03/2015 MUGA EF 64%; b. 08/2015 MUGA: EF 51%; c. 10/2015 MUGA: EF 44%; d. 11/2015 Echo: EF 45-50%; e. 01/2016 MUGA: EF 60%; f. 06/2016 MUGA EF 65%; g. 10/2016 MUGA: EF 61%;  h. 12/2016 Echo: EF 55-60%, gr1 DD.  Marland Kitchen Hyperlipidemia   . Hypertension   . Neuropathy   . Possible PAD (peripheral artery disease) ([place])    a. 11/2016 LE cyanosis and weak pulses-->CTA w/o significant Ao-BiFem dzs. ? distal dzs-->ASA/Plavix initiated by vascular surgery.  Marland Kitchen PSVT (paroxysmal supraventricular tachycardia) ([place])    a. Dx 11/2016.  . Pulmonary embolism ([place])    a. 12/2016 CTA Chest: small nonocclusive PE in inferior segment of the Left lingula,  somewhat eccentric filling defect suggesting chronic rather than acute embolic event; b. 09/5377 LE U/S:  No DVT; c. 12/2016 Echo: Nl RV fxn, nl PASP.  Marland Kitchen Recurrent Metastatic breast cancer ([place])    a. Dx 2016: Stage II, ER positive, PR positive, HER-2/neu overexpressing of the left breast-->chemo/radiation; b. 10/2016 CT Abd/pelvis: diffuse liver mets, ill defined sclerotic bone lesions-T12;  c. 10/2016 MRI brain: metastatic lesion along L temporal lobe (19x61m) w/ extensive surrounding edema & 532mmidline shift to right.  . Sinus tachycardia     Past Surgical History:  Procedure Laterality Date  . BREAST BIOPSY Left 2016   Positive  . BREAST LUMPECTOMY WITH SENTINEL LYMPH NODE BIOPSY Left 05/23/2015   Procedure: LEFT BREAST WIDE EXCISION WITH AXILLARY DISSECTION, MASTOPLASTY ;  Surgeon: JeRobert BellowMD;  Location: [facility] ORS;  Service: General;  Laterality: Left;  . BREAST SURGERY Left 12/18/14   breast biopsy/INVASIVE DUCTAL CARCINOMA OF BREAST, NOTTINGHAM GRADE 2.  . Marland KitchenREAST SURGERY  05/23/2015.   Wide excision/mastoplasty, axillary dissection. No residual invasive cancer, positive for residual DCIS. 0/2 nodes identified on axillary dissection. (no SLN by technetium or methylene blue)  . CARDIAC CATHETERIZATION    . COLONOSCOPY WITH PROPOFOL N/A 12/10/2016   Procedure: COLONOSCOPY WITH PROPOFOL;  Surgeon: DaLucilla LameMD;  Location: [facility] ENDOSCOPY;  Service: Endoscopy;  Laterality: N/A;  . COLONOSCOPY WITH PROPOFOL N/A 02/13/2017   Procedure: COLONOSCOPY WITH PROPOFOL;  Surgeon: Lucilla Lame, MD;  [facility] END[facility];  Service: Endoscopy;  Laterality: N/A;  . ESOPHAGOGASTRODUODENOSCOPY (EGD) WITH PROPOFOL N/A 12/08/2016   Procedure: ESOPHAGOGASTRODUODENOSCOPY (EGD) WITH PROPOFOL;  Surgeon: Lucilla Lame, MD;  Location: [facility] ENDOSCOPY;  Service: Endoscopy;  Laterality: N/A;  . IVC FILTER INSERTION N/A 02/15/2017   Procedure: IVC Filter Insertion;  Surgeon: Algernon Huxley, MD;  Location: [place] CV LAB;  Service: Cardiovascular;  Laterality: N/A;  . PORTACATH PLACEMENT Right 12-31-14   Dr Bary Castilla    There were no vitals filed for this visit.      Subjective Assessment - 05/03/17 1610    Subjective No pain except for the hands and feet.   Pertinent History Bilateral UE and LE weakness. Pt states that she feels like her weakness came from her neuropathy. Difficulty writing. Both hands feel numb currently but can feel her hand when she touches it. Has had the weakness for about 3 months (since April 2018) which began gradually.  Went through chemo and radiation last year for about 2.5 months until August 2017.  Still has cancer in her L breast which also traveled to her brain and liver.   Pt also lost 10 lbs since she was diagnosed with cancer 2 years ago.  Apetite is fine. Normally eats breakfast at times. Normally eats one good meal during the day and eats junk in between such as cookies and sodas.  Eats a frozen dinner during lunch. Does not know if she gets enough protein.   Only has pain in her feet and hands bilaterally.  No neck or back pain.   Has been using a rw since May 2018.  Was independent with gait before May 2018.  Pt goal is to improve her function.    Patient Stated Goals I want to be able to get out of the walker and not use a cane. Be able to do all the things she used to do such as cooking a meal, laundry,    Currently in Pain? Other (Comment)  bilateral hand and feet pain                                 PT Education - 05/03/17 1614    Education provided Yes   Education Details ther-ex   Person(s) Educated Patient   Methods Explanation;Demonstration;Tactile cues;Verbal cues   Comprehension Returned demonstration;Verbalized understanding         Objectives  There-ex  Blood pressure R arm sitting, mechanically taken: 118/75, HR 97 SpO2 100% room Air   SLS with bilateral UE assist 5x5 seconds for 3 sets each LE to promote  glute med strength   Gait with SPC on L side 200 ft with CGA to SBA  Sitting on dyna-disc to promote core muscle activation Then with low rows resisting yellow band 10x3 with 5 second holds    Stepping over line with R foot using SPC on L side 10x2 Then stepping over line with L foot, not using SPC 10x2  Better able to step over line with L foot after cues to transfer body weight onto her L foot as she steps over.    Standing heel toe raises with bilateral UE assist 5x3 each way Forward step up onto 3 inch step with one UE assist 5x each LE    Improved exercise technique, movement at target joints, use of target muscles after min to mod verbal, visual, tactile  cues.   Improved ability to step over a line with L LE without using SPC after cues to transfer body weight onto her foot. Continued working on LE strengthening, gait and balance to promote function at home. Pt tolerated session well without aggravation of symptoms.           PT Long Term Goals - 04/20/17 1957      PT LONG TERM GOAL #1   Title Patient will be able to perform her TUG time using a rw to 18 seconds or less to promote functional mobility.   Baseline 25.3 seconds average with rw (04/20/2017)   Time 6   Period Weeks   Status New   Target Date 06/03/17     PT LONG TERM GOAL #2   Title Patient will improve bilateral UE and bilateral hip, and knee strength to promote ability to perform functional tasks at home.    Time 6   Period Weeks   Status New   Target Date 06/03/17     PT LONG TERM GOAL #3   Title Patient will improve her LEFS score by at least 9 points as a demonstration of improved function.    Baseline 25/80 (04/20/2017)   Time 6   Period Weeks   Status New   Target Date 06/03/17     PT LONG TERM GOAL #4   Title Patient will improve her Quick Dash Disability/Symptom score by at least 15% as a demonstration of improved function.    Baseline 70.45% (04/20/2017)   Time 6   Period  Weeks   Status New   Target Date 06/03/17               Plan - 05/03/17 1607    Clinical Impression Statement Improved ability to step over a line with L LE without using SPC after cues to transfer body weight onto her foot. Continued working on LE strengthening, gait and balance to promote function at home. Pt tolerated session well without aggravation of symptoms.    History and Personal Factors relevant to plan of care: Hx of CA, weakness, multiple health problems   Clinical Presentation Stable   Clinical Presentation due to: able to ambulate with SPC 200 ft today   Clinical Decision Making Low   Rehab Potential Fair   Clinical Impairments Affecting Rehab Potential hx of CA, multiple health problems   PT Frequency 2x / week   PT Duration 6 weeks   PT Treatment/Interventions Manual techniques;Patient/family education;Therapeutic activities;Therapeutic exercise;Gait training;Neuromuscular re-education;Balance training   PT Next Visit Plan hip, scapular, LE, UE strengthening, gait, balance, femoral control   Consulted and Agree with Plan of Care Patient      Patient will benefit from skilled therapeutic intervention in order to improve the following deficits and impairments:  Pain, Improper body mechanics, Postural dysfunction, Impaired UE functional use, Decreased strength, Difficulty walking, Decreased endurance  Visit Diagnosis: Muscle weakness (generalized)  Difficulty in walking, not elsewhere classified     Problem List Patient Active Problem List   Diagnosis Date Noted  . Blood in stool   . Polyp of sigmoid colon   . Benign neoplasm of transverse colon   . Benign neoplasm of ascending colon   . Lower GI bleed   . Weakness of right lower extremity 02/11/2017  . Metastatic breast cancer ([place]) 01/18/2017  . Orthostatic lightheadedness 01/14/2017  . Atrial tachycardia ([place]) 01/14/2017  . Hypokalemia 01/14/2017  . Pulmonary emboli ([place]) 01/12/2017  . Dehydration  12/25/2016  .  Blood loss anemia 12/15/2016  . Ulceration of colon   . Heartburn   . Duodenitis   . Gastritis without bleeding   . GI bleed   . Acute esophagitis   . Acute esophagogastric ulcer   . Rectal bleed   . Hypovolemic shock ([place])   . Ischemic leg 12/04/2016  . Chemotherapy induced neutropenia ([place]) 11/20/2016  . Palliative care by specialist   . Goals of care, counseling/discussion   . DNR (do not resuscitate) discussion   . Cerebral edema ([place]) 11/10/2016  . Encounter for monitoring cardiotoxic drug therapy 11/09/2016  . Elevated LFTs 10/20/2016  . URI with cough and congestion 08/25/2016  . Peripheral neuropathy due to chemotherapy ([place]) 03/13/2016  . Encounter for antineoplastic chemotherapy 03/13/2016  . HLD (hyperlipidemia) 01/29/2016  . Hyperglycemia, unspecified 01/29/2016  . Cardiomyopathy ([place]) 01/13/2016  . Hypertension 07/18/2015  . Carcinoma of upper-outer quadrant of left breast in female, estrogen receptor positive ([place]) 12/27/2014    Joneen Boers PT, DPT   05/03/2017, 7:09 PM  [place] [facility] [address] [place], [address] Phone: [phone]   Fax:  [phone]  Name: Sarah Horne MRN: [id number] Date of Birth: 12/19/1955

## 2017-05-04 ENCOUNTER — Other Ambulatory Visit: Payer: Self-pay | Admitting: *Deleted

## 2017-05-04 DIAGNOSIS — C50412 Malignant neoplasm of upper-outer quadrant of left female breast: Secondary | ICD-10-CM

## 2017-05-04 DIAGNOSIS — Z170 Estrogen receptor positive status [ER+]: Principal | ICD-10-CM

## 2017-05-05 ENCOUNTER — Inpatient Hospital Stay (HOSPITAL_BASED_OUTPATIENT_CLINIC_OR_DEPARTMENT_OTHER): Payer: BLUE CROSS/BLUE SHIELD | Admitting: Internal Medicine

## 2017-05-05 ENCOUNTER — Ambulatory Visit: Payer: BLUE CROSS/BLUE SHIELD

## 2017-05-05 ENCOUNTER — Inpatient Hospital Stay: Payer: BLUE CROSS/BLUE SHIELD | Attending: Internal Medicine

## 2017-05-05 VITALS — BP 115/79 | HR 106 | Temp 97.4°F | Resp 20

## 2017-05-05 DIAGNOSIS — I471 Supraventricular tachycardia: Secondary | ICD-10-CM | POA: Insufficient documentation

## 2017-05-05 DIAGNOSIS — K7290 Hepatic failure, unspecified without coma: Secondary | ICD-10-CM | POA: Diagnosis not present

## 2017-05-05 DIAGNOSIS — Z9221 Personal history of antineoplastic chemotherapy: Secondary | ICD-10-CM

## 2017-05-05 DIAGNOSIS — Z170 Estrogen receptor positive status [ER+]: Secondary | ICD-10-CM | POA: Diagnosis not present

## 2017-05-05 DIAGNOSIS — Z86711 Personal history of pulmonary embolism: Secondary | ICD-10-CM | POA: Insufficient documentation

## 2017-05-05 DIAGNOSIS — I10 Essential (primary) hypertension: Secondary | ICD-10-CM | POA: Diagnosis not present

## 2017-05-05 DIAGNOSIS — M79642 Pain in left hand: Secondary | ICD-10-CM | POA: Diagnosis not present

## 2017-05-05 DIAGNOSIS — M8580 Other specified disorders of bone density and structure, unspecified site: Secondary | ICD-10-CM

## 2017-05-05 DIAGNOSIS — M6281 Muscle weakness (generalized): Secondary | ICD-10-CM

## 2017-05-05 DIAGNOSIS — F17210 Nicotine dependence, cigarettes, uncomplicated: Secondary | ICD-10-CM | POA: Diagnosis not present

## 2017-05-05 DIAGNOSIS — C7931 Secondary malignant neoplasm of brain: Secondary | ICD-10-CM

## 2017-05-05 DIAGNOSIS — E785 Hyperlipidemia, unspecified: Secondary | ICD-10-CM

## 2017-05-05 DIAGNOSIS — R262 Difficulty in walking, not elsewhere classified: Secondary | ICD-10-CM

## 2017-05-05 DIAGNOSIS — Z79899 Other long term (current) drug therapy: Secondary | ICD-10-CM | POA: Diagnosis not present

## 2017-05-05 DIAGNOSIS — Z923 Personal history of irradiation: Secondary | ICD-10-CM | POA: Diagnosis not present

## 2017-05-05 DIAGNOSIS — C50412 Malignant neoplasm of upper-outer quadrant of left female breast: Secondary | ICD-10-CM

## 2017-05-05 DIAGNOSIS — M79641 Pain in right hand: Secondary | ICD-10-CM | POA: Diagnosis not present

## 2017-05-05 DIAGNOSIS — G629 Polyneuropathy, unspecified: Secondary | ICD-10-CM | POA: Diagnosis not present

## 2017-05-05 DIAGNOSIS — G620 Drug-induced polyneuropathy: Secondary | ICD-10-CM | POA: Diagnosis not present

## 2017-05-05 DIAGNOSIS — Z7689 Persons encountering health services in other specified circumstances: Secondary | ICD-10-CM | POA: Insufficient documentation

## 2017-05-05 DIAGNOSIS — T451X5A Adverse effect of antineoplastic and immunosuppressive drugs, initial encounter: Secondary | ICD-10-CM | POA: Diagnosis not present

## 2017-05-05 DIAGNOSIS — Z79818 Long term (current) use of other agents affecting estrogen receptors and estrogen levels: Secondary | ICD-10-CM | POA: Diagnosis not present

## 2017-05-05 DIAGNOSIS — E876 Hypokalemia: Secondary | ICD-10-CM | POA: Diagnosis not present

## 2017-05-05 LAB — COMPREHENSIVE METABOLIC PANEL
ALBUMIN: 2.9 g/dL — AB (ref 3.5–5.0)
ALK PHOS: 176 U/L — AB (ref 38–126)
ALT: 23 U/L (ref 14–54)
AST: 56 U/L — AB (ref 15–41)
Anion gap: 8 (ref 5–15)
BILIRUBIN TOTAL: 1.3 mg/dL — AB (ref 0.3–1.2)
BUN: 12 mg/dL (ref 6–20)
CALCIUM: 9 mg/dL (ref 8.9–10.3)
CO2: 24 mmol/L (ref 22–32)
Chloride: 100 mmol/L — ABNORMAL LOW (ref 101–111)
Creatinine, Ser: 0.68 mg/dL (ref 0.44–1.00)
GFR calc Af Amer: >60 mL/min (ref >60)
GFR calc non Af Amer: >60 mL/min (ref >60)
GLUCOSE: 89 mg/dL (ref 65–99)
Potassium: 4 mmol/L (ref 3.5–5.1)
Sodium: 132 mmol/L — ABNORMAL LOW (ref 135–145)
TOTAL PROTEIN: 7.6 g/dL (ref 6.5–8.1)

## 2017-05-05 LAB — CBC WITH DIFFERENTIAL/PLATELET
BASOS ABS: 0 10*3/uL (ref 0–0.1)
BASOS PCT: 0 %
Eosinophils Absolute: 0.3 10*3/uL (ref 0–0.7)
Eosinophils Relative: 11 %
HEMATOCRIT: 31.2 % — AB (ref 35.0–47.0)
HEMOGLOBIN: 10.5 g/dL — AB (ref 12.0–16.0)
Lymphocytes Relative: 15 %
Lymphs Abs: 0.4 10*3/uL — ABNORMAL LOW (ref 1.0–3.6)
MCH: 31.7 pg (ref 26.0–34.0)
MCHC: 33.7 g/dL (ref 32.0–36.0)
MCV: 94.2 fL (ref 80.0–100.0)
MONOS PCT: 11 %
Monocytes Absolute: 0.3 10*3/uL (ref 0.2–0.9)
NEUTROS ABS: 1.5 10*3/uL (ref 1.4–6.5)
NEUTROS PCT: 63 %
Platelets: 233 10*3/uL (ref 150–440)
RBC: 3.32 MIL/uL — ABNORMAL LOW (ref 3.80–5.20)
RDW: 22.6 % — ABNORMAL HIGH (ref 11.5–14.5)
WBC: 2.5 10*3/uL — ABNORMAL LOW (ref 3.6–11.0)

## 2017-05-05 NOTE — Assessment & Plan Note (Addendum)
#  RECURRENT Metastatic breast cancer- ER/PR positive HER-2/neu negative [status post liver biopsy February 2018; previously HER-2/neu positive]. Currently on palliative Faslodex+ Abemaciclib. Clinically improving; CT scan shows improvement of the liver lesions; tumor markers improving.  # Today white count is 2.5 ANC 1.5; continue to hold Abema at this time. Patient was given a prescription for 50 mg of abema at last visit. She was asked to bring at next visit.  # Liver failure- sec to progressive malignancy- clinical improvement noted. Not needing any ascites.  # Brain mets- s/p RT [finished Feb 28th]; symptomatic improvement.   MRI May 2018- improved.  # PN G-3 secondary to chemotherapy. Continue Lyrica; oxycodone once a day.  # Hypokalemia- continue K supp.   #  Debility/starting physical therapy- improvement noted.   # Follow-up in 2 weeks/labs/Faslodex.    Reviewed the lab work progress with the patient and her brother in detail.

## 2017-05-05 NOTE — Therapy (Signed)
[facility] [address], [place], [address] Phone: [phone]   Fax:  [phone]  Physical Therapy Treatment  Patient Details  Name: Sarah Horne MRN: [id number] Date of Birth: 1956/03/04 Referring Provider: Charlaine Dalton, MD  Encounter Date: 05/05/2017      PT End of Session - 05/05/17 1302    Visit Number 6   Number of Visits 13   Date for PT Re-Evaluation 06/03/17   PT Start Time 1302   PT Stop Time 1345   PT Time Calculation (min) 43 min   Equipment Utilized During Treatment Gait belt  rw, SPC   Activity Tolerance Patient tolerated treatment well   Behavior During Therapy [facility] for tasks assessed/performed      Past Medical History:  Diagnosis Date  . Chemotherapy-induced peripheral neuropathy ([place])   . Epistaxis    a. 11/2016 in setting of asa/plavix-->silver nitrate cauterization.  . GI bleed    a. 11/2016 Admission w/ GIB and hypovolemic shock req 3u PRBC's;  b. 11/2016 ECG: gastritis & nonbleeding peptic ulcer; c. 11/2016 Conlonoscopy: rectal and sigmoid colonic ulcers.  . Heart attack ([place])    a. 1998 Cath @ [facility]: reportedly no intervention required.  Marland Kitchen Herceptin-induced cardiomyopathy ([place])    a. In the setting of Herceptin Rx for breast cancer (initiated 12/2014); b. 03/2015 MUGA EF 64%; b. 08/2015 MUGA: EF 51%; c. 10/2015 MUGA: EF 44%; d. 11/2015 Echo: EF 45-50%; e. 01/2016 MUGA: EF 60%; f. 06/2016 MUGA EF 65%; g. 10/2016 MUGA: EF 61%;  h. 12/2016 Echo: EF 55-60%, gr1 DD.  Marland Kitchen Hyperlipidemia   . Hypertension   . Neuropathy   . Possible PAD (peripheral artery disease) ([place])    a. 11/2016 LE cyanosis and weak pulses-->CTA w/o significant Ao-BiFem dzs. ? distal dzs-->ASA/Plavix initiated by vascular surgery.  Marland Kitchen PSVT (paroxysmal supraventricular tachycardia) ([place])    a. Dx 11/2016.  . Pulmonary embolism ([place])    a. 12/2016 CTA Chest: small nonocclusive PE in inferior segment of the Left lingula,  somewhat eccentric filling defect suggesting chronic rather than acute embolic event; b. 03/4826 LE U/S:  No DVT; c. 12/2016 Echo: Nl RV fxn, nl PASP.  Marland Kitchen Recurrent Metastatic breast cancer ([place])    a. Dx 2016: Stage II, ER positive, PR positive, HER-2/neu overexpressing of the left breast-->chemo/radiation; b. 10/2016 CT Abd/pelvis: diffuse liver mets, ill defined sclerotic bone lesions-T12;  c. 10/2016 MRI brain: metastatic lesion along L temporal lobe (19x33m) w/ extensive surrounding edema & 575mmidline shift to right.  . Sinus tachycardia     Past Surgical History:  Procedure Laterality Date  . BREAST BIOPSY Left 2016   Positive  . BREAST LUMPECTOMY WITH SENTINEL LYMPH NODE BIOPSY Left 05/23/2015   Procedure: LEFT BREAST WIDE EXCISION WITH AXILLARY DISSECTION, MASTOPLASTY ;  Surgeon: JeRobert BellowMD;  Location: [facility] ORS;  Service: General;  Laterality: Left;  . BREAST SURGERY Left 12/18/14   breast biopsy/INVASIVE DUCTAL CARCINOMA OF BREAST, NOTTINGHAM GRADE 2.  . Marland KitchenREAST SURGERY  05/23/2015.   Wide excision/mastoplasty, axillary dissection. No residual invasive cancer, positive for residual DCIS. 0/2 nodes identified on axillary dissection. (no SLN by technetium or methylene blue)  . CARDIAC CATHETERIZATION    . COLONOSCOPY WITH PROPOFOL N/A 12/10/2016   Procedure: COLONOSCOPY WITH PROPOFOL;  Surgeon: DaLucilla LameMD;  Location: [facility] ENDOSCOPY;  Service: Endoscopy;  Laterality: N/A;  . COLONOSCOPY WITH PROPOFOL N/A 02/13/2017   Procedure: COLONOSCOPY WITH PROPOFOL;  Surgeon: Lucilla Lame, MD;  Location: [facility] ENDOSCOPY;  Service: Endoscopy;  Laterality: N/A;  . ESOPHAGOGASTRODUODENOSCOPY (EGD) WITH PROPOFOL N/A 12/08/2016   Procedure: ESOPHAGOGASTRODUODENOSCOPY (EGD) WITH PROPOFOL;  Surgeon: Lucilla Lame, MD;  Location: [facility] ENDOSCOPY;  Service: Endoscopy;  Laterality: N/A;  . IVC FILTER INSERTION N/A 02/15/2017   Procedure: IVC Filter Insertion;  Surgeon: Algernon Huxley, MD;  Location: [facility];  Service: Cardiovascular;  Laterality: N/A;  . PORTACATH PLACEMENT Right 12-31-14   Dr Bary Castilla    There were no vitals filed for this visit.      Subjective Assessment - 05/05/17 1309    Subjective Pt states doing fine, no pain other than her hands and feet.   Pertinent History Bilateral UE and LE weakness. Pt states that she feels like her weakness came from her neuropathy. Difficulty writing. Both hands feel numb currently but can feel her hand when she touches it. Has had the weakness for about 3 months (since April 2018) which began gradually.  Went through chemo and radiation last year for about 2.5 months until August 2017.  Still has cancer in her L breast which also traveled to her brain and liver.   Pt also lost 10 lbs since she was diagnosed with cancer 2 years ago.  Apetite is fine. Normally eats breakfast at times. Normally eats one good meal during the day and eats junk in between such as cookies and sodas.  Eats a frozen dinner during lunch. Does not know if she gets enough protein.   Only has pain in her feet and hands bilaterally.  No neck or back pain.   Has been using a rw since May 2018.  Was independent with gait before May 2018.  Pt goal is to improve her function.    Patient Stated Goals I want to be able to get out of the walker and not use a cane. Be able to do all the things she used to do such as cooking a meal, laundry,    Currently in Pain? No/denies   Pain Score 0-No pain                                 PT Education - 05/05/17 1318    Education provided Yes   Education Details ther-ex   Person(s) Educated Patient   Methods Explanation;Demonstration;Tactile cues;Verbal cues   Comprehension Returned demonstration;Verbalized understanding        Objectives   There-ex  Pt observed to ambulate into clinic with her SPC instead of her rw today. Adjusted personal SPC to proper height.   Gait with SPC on L side 200 ft,  SBA  Blood pressure R arm sitting, mechanically taken: 106/71, HR 117 SpO2 98% room Air  Sit <> stand from regular chair and 2 pillows, bilateral UE assist to stand, no UE to sit, emphasis on femoral control and not pushing back against the chair the her knees. 5x3   Seated knee extension resisting 2 lbs weight 10x3 each LE. Easy for pt. Upgrade weight next visit if appropriate.   Standing hip abduction resisting yellow band with bilateral UE assist 10x3 each LE    Sitting on dyna-disc to promote core muscle activation  Low rows resisting yellow band 5x10 seconds for 4 sets    Forward step up onto and off 3 inch step with contralateral SPC assist 10x each LE  To promote ability negotiate curbs. CGA  to min A. Pt was recommended that if she is negotiating curbs with SPC at home, to do that with her brother for safety.Pt verbalized understanding.   Standing heel toe raises with bilateral UE assist 5x2 each way    Improved exercise technique, movement at target joints, use of target muscles after min to mod verbal, visual, tactile cues.    Able to ambulate with SPC throughout session without LOB. Worked on stepping onto and off 3 inch step using [place] assist to promote ability to negotiate curbs. Continued working on improving bilateral LE strength to promote ability to perform functional tasks at home such as sit <> stand tranfers. Pt demonstrates bilateral hip weakness with decreased femoral control with sit <> stand and stepping onto a 3 inch step.         PT Long Term Goals - 04/20/17 1957      PT LONG TERM GOAL #1   Title Patient will be able to perform her TUG time using a rw to 18 seconds or less to promote functional mobility.   Baseline 25.3 seconds average with rw (04/20/2017)   Time 6   Period Weeks   Status New   Target Date 06/03/17     PT LONG TERM GOAL #2   Title Patient will improve bilateral UE and bilateral hip, and knee strength to promote ability to perform  functional tasks at home.    Time 6   Period Weeks   Status New   Target Date 06/03/17     PT LONG TERM GOAL #3   Title Patient will improve her LEFS score by at least 9 points as a demonstration of improved function.    Baseline 25/80 (04/20/2017)   Time 6   Period Weeks   Status New   Target Date 06/03/17     PT LONG TERM GOAL #4   Title Patient will improve her Quick Dash Disability/Symptom score by at least 15% as a demonstration of improved function.    Baseline 70.45% (04/20/2017)   Time 6   Period Weeks   Status New   Target Date 06/03/17               Plan - 05/05/17 1301    Clinical Impression Statement Able to ambulate with SPC throughout session without LOB. Worked on stepping onto and off 3 inch step using [place] assist to promote ability to negotiate curbs. Continued working on improving bilateral LE strength to promote ability to perform functional tasks at home such as sit <> stand tranfers. Pt demonstrates bilateral hip weakness with decreased femoral control with sit <> stand and stepping onto a 3 inch step.    History and Personal Factors relevant to plan of care: hx of CA, weakness, multiple health problems   Clinical Presentation Stable   Clinical Presentation due to: able to ambulate with SPC on even surface without LOB   Clinical Decision Making Low   Rehab Potential Fair   Clinical Impairments Affecting Rehab Potential hx of CA, multiple health problems   PT Frequency 2x / week   PT Duration 6 weeks   PT Treatment/Interventions Manual techniques;Patient/family education;Therapeutic activities;Therapeutic exercise;Gait training;Neuromuscular re-education;Balance training   PT Next Visit Plan hip, scapular, LE, UE strengthening, gait, balance, femoral control   Consulted and Agree with Plan of Care Patient      Patient will benefit from skilled therapeutic intervention in order to improve the following deficits and impairments:  Pain, Improper body  mechanics, Postural dysfunction,  Impaired UE functional use, Decreased strength, Difficulty walking, Decreased endurance  Visit Diagnosis: Muscle weakness (generalized)  Difficulty in walking, not elsewhere classified     Problem List Patient Active Problem List   Diagnosis Date Noted  . Blood in stool   . Polyp of sigmoid colon   . Benign neoplasm of transverse colon   . Benign neoplasm of ascending colon   . Lower GI bleed   . Weakness of right lower extremity 02/11/2017  . Metastatic breast cancer ([place]) 01/18/2017  . Orthostatic lightheadedness 01/14/2017  . Atrial tachycardia ([place]) 01/14/2017  . Hypokalemia 01/14/2017  . Pulmonary emboli ([place]) 01/12/2017  . Dehydration 12/25/2016  . Blood loss anemia 12/15/2016  . Ulceration of colon   . Heartburn   . Duodenitis   . Gastritis without bleeding   . GI bleed   . Acute esophagitis   . Acute esophagogastric ulcer   . Rectal bleed   . Hypovolemic shock ([place])   . Ischemic leg 12/04/2016  . Chemotherapy induced neutropenia ([place]) 11/20/2016  . Palliative care by specialist   . Goals of care, counseling/discussion   . DNR (do not resuscitate) discussion   . Cerebral edema ([place]) 11/10/2016  . Encounter for monitoring cardiotoxic drug therapy 11/09/2016  . Elevated LFTs 10/20/2016  . URI with cough and congestion 08/25/2016  . Peripheral neuropathy due to chemotherapy ([place]) 03/13/2016  . Encounter for antineoplastic chemotherapy 03/13/2016  . HLD (hyperlipidemia) 01/29/2016  . Hyperglycemia, unspecified 01/29/2016  . Cardiomyopathy ([place]) 01/13/2016  . Hypertension 07/18/2015  . Carcinoma of upper-outer quadrant of left breast in female, estrogen receptor positive ([place]) 12/27/2014    Joneen Boers PT, DPT   05/05/2017, 8:22 PM  [facility] [address] [place], [address] Phone: [phone]   Fax:  [phone]  Name: Sarah Horne MRN:  [id number] Date of Birth: Jan 27, 1956

## 2017-05-05 NOTE — Progress Notes (Signed)
[place] OFFICE PROGRESS NOTE  Patient Care Team: [name], MD as PCP - General (Family Medicine) Bary Castilla, Forest Gleason, MD (General Surgery) [name], MD (Inactive) as Attending Physician (Internal Medicine)  No matching staging information was found for the patient.   Oncology History   # LEFT BREAST IDC; STAGE II [cT2N1] ER >90%; PR- 50-90%; her 2 NEU- POS; s/p Neoadj chemo; AUG 2016- TCH+P s/p Lumpec & partial ALND- path CR; s/p RT [finished Nov 2016];  adj Herceptin; HELD for Jan 19th 2017 [in DEC 2016-EF dropped from 63 to 51%; FEB 27th EF-42.9%]; JAN 2016 START Arimidex; May 2017- EF- 60%; May 24th 2017-Re-start Herceptin q 3W; July 28th STOP herceptin [finished 47m/m2; sec to Low EF; however 2017 Oct EF= 67%; improved]  # DEC 4th 2017- Start Neratinib 4 pills; DEC 11th 3 pills; STOPPED.  # FEB 2018- RECURRENCE ER/PR positive; Her 2 NEGATIVE [liver Bx]  # MARCH 1st 2018- Tax-Cytoxan  # FEB 2018- Brain mets [SBRT; Dr.Crystal; finished Feb 28th 2018]  # March 2018- Eribulin  # PN- G-2; may 2017- Cymbalta 671md;MARCH 2018-  acute vascular insuff of BIL LE [? Taxotere vasoconstriction on asprin]; # hemorragic shock LGIB [s/p colo; Dr.Wohl]    # April 2016- Liver Bx- NEG;   # Drop in EF from Herceptin [recovered OCt 27th- EF 65%]  # BMD- jan 2017- osteopenia     Carcinoma of upper-outer quadrant of left breast in female, estrogen receptor positive ([place]  12/27/2014 Initial Diagnosis    Breast cancer of upper-outer quadrant of left female breast ([facility]       INTERVAL HISTORY:  A very pleasant [age]ear old female patient with above history of Recurrent stage IV ER/PR positive HER-2/neu NEGATIVE [status post liver biopsy February 2018] - is currently on Faslodex-Abemaciclib is here for follow-up/review the results of the CT scan. Patient is currently off Abema x 6 Weeks because of severe neutropenia.   Her appetite is improving. Energy  levels are improving. She is more up and about with a walker at home; patient is trying to use a cane at home.. No falls. No headaches. She is working with physical therapy. No fever no chills. Patient has not had any recent hospitalizations. Patient did not had to have her ascites tapped in the interim.  REVIEW OF SYSTEMS:  A complete 10 point review of system is done which is negative except mentioned above/history of present illness.   PAST MEDICAL HISTORY :  Past Medical History:  Diagnosis Date  . Chemotherapy-induced peripheral neuropathy ([facility]  . Epistaxis    a. 11/2016 in setting of asa/plavix-->silver nitrate cauterization.  . GI bleed    a. 11/2016 Admission w/ GIB and hypovolemic shock req 3u PRBC's;  b. 11/2016 ECG: gastritis & nonbleeding peptic ulcer; c. 11/2016 Conlonoscopy: rectal and sigmoid colonic ulcers.  . Heart attack ([facility]   a. 1998 Cath @ [facility]: reportedly no intervention required.  . Marland Kitchenerceptin-induced cardiomyopathy ([facility]   a. In the setting of Herceptin Rx for breast cancer (initiated 12/2014); b. 03/2015 MUGA EF 64%; b. 08/2015 MUGA: EF 51%; c. 10/2015 MUGA: EF 44%; d. 11/2015 Echo: EF 45-50%; e. 01/2016 MUGA: EF 60%; f. 06/2016 MUGA EF 65%; g. 10/2016 MUGA: EF 61%;  h. 12/2016 Echo: EF 55-60%, gr1 DD.  . Marland Kitchenyperlipidemia   . Hypertension   . Neuropathy   . Possible PAD (peripheral artery disease) ([facility] [place]   a. 11/2016 LE cyanosis and weak  pulses-->CTA w/o significant Ao-BiFem dzs. ? distal dzs-->ASA/Plavix initiated by vascular surgery.  Marland Kitchen PSVT (paroxysmal supraventricular tachycardia) ([place])    a. Dx 11/2016.  . Pulmonary embolism ([place])    a. 12/2016 CTA Chest: small nonocclusive PE in inferior segment of the Left lingula, somewhat eccentric filling defect suggesting chronic rather than acute embolic event; b. 02/5464 LE U/S:  No DVT; c. 12/2016 Echo: Nl RV fxn, nl PASP.  Marland Kitchen Recurrent Metastatic breast cancer ([place])    a. Dx 2016: Stage II, ER positive, PR positive, HER-2/neu  overexpressing of the left breast-->chemo/radiation; b. 10/2016 CT Abd/pelvis: diffuse liver mets, ill defined sclerotic bone lesions-T12;  c. 10/2016 MRI brain: metastatic lesion along L temporal lobe (19x11m) w/ extensive surrounding edema & 540mmidline shift to right.  . Sinus tachycardia     PAST SURGICAL HISTORY :   Past Surgical History:  Procedure Laterality Date  . BREAST BIOPSY Left 2016   Positive  . BREAST LUMPECTOMY WITH SENTINEL LYMPH NODE BIOPSY Left 05/23/2015   Procedure: LEFT BREAST WIDE EXCISION WITH AXILLARY DISSECTION, MASTOPLASTY ;  Surgeon: JeRobert BellowMD;  Location: [facility] ORS;  Service: General;  Laterality: Left;  . BREAST SURGERY Left 12/18/14   breast biopsy/INVASIVE DUCTAL CARCINOMA OF BREAST, NOTTINGHAM GRADE 2.  . Marland KitchenREAST SURGERY  05/23/2015.   Wide excision/mastoplasty, axillary dissection. No residual invasive cancer, positive for residual DCIS. 0/2 nodes identified on axillary dissection. (no SLN by technetium or methylene blue)  . CARDIAC CATHETERIZATION    . COLONOSCOPY WITH PROPOFOL N/A 12/10/2016   Procedure: COLONOSCOPY WITH PROPOFOL;  Surgeon: DaLucilla LameMD;  Location: [facility] ENDOSCOPY;  Service: Endoscopy;  Laterality: N/A;  . COLONOSCOPY WITH PROPOFOL N/A 02/13/2017   Procedure: COLONOSCOPY WITH PROPOFOL;  Surgeon: WoLucilla LameMD;  [facility];  Service: Endoscopy;  Laterality: N/A;  . ESOPHAGOGASTRODUODENOSCOPY (EGD) WITH PROPOFOL N/A 12/08/2016   Procedure: ESOPHAGOGASTRODUODENOSCOPY (EGD) WITH PROPOFOL;  Surgeon: DaLucilla LameMD;  Location: [facility] END[facility]COPY;  Service: Endoscopy;  Laterality: N/A;  . IVC FILTER INSERTION N/A 02/15/2017   Procedure: IVC Filter Insertion;  Surgeon: DeAlgernon HuxleyMD;  [facility];  Service: Cardiovascular;  Laterality: N/A;  . PORTACATH PLACEMENT Right 12-31-14   Dr ByBary Castilla  FAMILY HISTORY :   Family History  Problem Relation Age of Onset  . Breast cancer Maternal Aunt   . Breast  cancer Cousin   . Brain cancer Maternal Uncle   . Diabetes Mother   . Hypertension Mother   . Stroke Mother     SOCIAL HISTORY:   Social History  Substance Use Topics  . Smoking status: Light Tobacco Smoker    Packs/day: 0.50    Years: 18.00    Types: Cigarettes  . Smokeless tobacco: Never Used  . Alcohol use No    ALLERGIES:  is allergic to no known allergies.  MEDICATIONS:  Current Outpatient Prescriptions  Medication Sig Dispense Refill  . Abemaciclib 50 MG TABS Take 1 tablet by mouth daily. DO NOT START Until seen by MD. 30 tablet 3  . anastrozole (ARIMIDEX) 1 MG tablet Take 1 tablet (1 mg total) by mouth daily. 90 tablet 3  . diphenoxylate-atropine (LOMOTIL) 2.5-0.025 MG tablet Take 1 tablet by mouth 4 (four) times daily as needed for diarrhea or loose stools. Take it along with immodium (Patient not taking: Reported on 04/19/2017) 60 tablet 0  . feeding supplement, ENSURE ENLIVE, (ENSURE ENLIVE) LIQD Take 237 mLs by mouth 2 (two) times  daily between meals. 237 mL 12  . flecainide (TAMBOCOR) 50 MG tablet Take 1 tablet (50 mg total) by mouth 2 (two) times daily. 60 tablet 1  . magnesium oxide (MAG-OX) 400 MG tablet Take 1 tablet (400 mg total) by mouth 2 (two) times daily. 60 tablet 6  . nystatin (MYCOSTATIN) 100000 UNIT/ML suspension Take 5 mLs (500,000 Units total) by mouth 4 (four) times daily. 60 mL 0  . ondansetron (ZOFRAN) 8 MG tablet Take 1 tablet (8 mg total) by mouth every 8 (eight) hours as needed for nausea or vomiting (start 3 days; after chemo). 40 tablet 1  . oxyCODONE (OXY IR/ROXICODONE) 5 MG immediate release tablet Take 1 tablet (5 mg total) by mouth every 8 (eight) hours as needed for severe pain. 42 tablet 0  . potassium chloride SA (K-DUR,KLOR-CON) 20 MEQ tablet Take 1 tablet (20 mEq total) by mouth daily. 90 tablet 3  . pregabalin (LYRICA) 75 MG capsule Take 1 capsule (75 mg total) by mouth 2 (two) times daily. 60 capsule 2   No current facility-administered  medications for this visit.    Facility-Administered Medications Ordered in Other Visits  Medication Dose Route Frequency Provider Last Rate Last Dose  . 0.9 %  sodium chloride infusion   Intravenous Continuous Pandit, Sandeep, MD      . 0.9 %  sodium chloride infusion   Intravenous Continuous Lloyd Huger, MD 999 mL/hr at 04/24/15 1520    . heparin lock flush 100 unit/mL  500 Units Intravenous Once Berenzon, Dmitriy, MD      . sodium chloride 0.9 % injection 10 mL  10 mL Intravenous PRN [name], MD   10 mL at 04/04/15 1440  . sodium chloride flush (NS) 0.9 % injection 10 mL  10 mL Intravenous PRN Berenzon, Dmitriy, MD      . Tbo-Filgrastim (GRANIX) injection 480 mcg  480 mcg Subcutaneous Once Cammie Sickle, MD        PHYSICAL EXAMINATION: ECOG PERFORMANCE STATUS: 0 - Asymptomatic  There were no vitals taken for this visit.  There were no vitals filed for this visit.  GENERAL: Well-nourished well-developed; Alert, no distress and comfortable. Accompanied by her brother.   EYES: no pallor. No jaundice. She is in a wheelchair  OROPHARYNX: No  ulceration; good dentition ; NECK: supple, no masses felt LYMPH:  no palpable lymphadenopathy in the cervical, axillary or inguinal regions LUNGS: clear to auscultation and  No wheeze or crackles HEART/CVS: regular rate & rhythm and no murmurs; NO bilateral lower extremity edema.  ABDOMEN:abdomen soft, non-tender and normal bowel sounds Musculoskeletal:no cyanosis of digits and no clubbing  PSYCH: alert & oriented x 3 with fluent speech NEURO: no focal motor/sensory deficits- except for foot drop on the right side.   LABORATORY DATA:  I have reviewed the data as listed    Component Value Date/Time   NA 132 (L) 05/05/2017 1402   NA 135 03/04/2017 0918   NA 135 01/08/2015 0850   K 4.0 05/05/2017 1402   K 3.7 01/08/2015 0850   CL 100 (L) 05/05/2017 1402   CL 101 01/08/2015 0850   CO2 24 05/05/2017 1402   CO2 26  01/08/2015 0850   GLUCOSE 89 05/05/2017 1402   GLUCOSE 161 (H) 01/08/2015 0850   BUN 12 05/05/2017 1402   BUN 8 03/04/2017 0918   BUN 17 01/08/2015 0850   CREATININE 0.68 05/05/2017 1402   CREATININE 0.82 01/22/2015 1559   CALCIUM 9.0 05/05/2017 1402  CALCIUM 9.3 01/08/2015 0850   PROT 7.6 05/05/2017 1402   PROT 6.4 03/04/2017 0918   PROT 6.8 01/22/2015 1559   ALBUMIN 2.9 (L) 05/05/2017 1402   ALBUMIN 2.5 (L) 03/04/2017 0918   ALBUMIN 3.9 01/22/2015 1559   AST 56 (H) 05/05/2017 1402   AST 34 01/22/2015 1559   ALT 23 05/05/2017 1402   ALT 42 01/22/2015 1559   ALKPHOS 176 (H) 05/05/2017 1402   ALKPHOS 157 (H) 01/22/2015 1559   BILITOT 1.3 (H) 05/05/2017 1402   BILITOT 6.2 (H) 03/04/2017 0918   BILITOT 0.2 (L) 01/22/2015 1559   GFRNONAA >60 05/05/2017 1402   GFRNONAA >60 01/22/2015 1559   GFRAA >60 05/05/2017 1402   GFRAA >60 01/22/2015 1559    No results found for: SPEP, UPEP  Lab Results  Component Value Date   WBC 2.5 (L) 05/05/2017   NEUTROABS 1.5 05/05/2017   HGB 10.5 (L) 05/05/2017   HCT 31.2 (L) 05/05/2017   MCV 94.2 05/05/2017   PLT 233 05/05/2017      Chemistry      Component Value Date/Time   NA 132 (L) 05/05/2017 1402   NA 135 03/04/2017 0918   NA 135 01/08/2015 0850   K 4.0 05/05/2017 1402   K 3.7 01/08/2015 0850   CL 100 (L) 05/05/2017 1402   CL 101 01/08/2015 0850   CO2 24 05/05/2017 1402   CO2 26 01/08/2015 0850   BUN 12 05/05/2017 1402   BUN 8 03/04/2017 0918   BUN 17 01/08/2015 0850   CREATININE 0.68 05/05/2017 1402   CREATININE 0.82 01/22/2015 1559      Component Value Date/Time   CALCIUM 9.0 05/05/2017 1402   CALCIUM 9.3 01/08/2015 0850   ALKPHOS 176 (H) 05/05/2017 1402   ALKPHOS 157 (H) 01/22/2015 1559   AST 56 (H) 05/05/2017 1402   AST 34 01/22/2015 1559   ALT 23 05/05/2017 1402   ALT 42 01/22/2015 1559   BILITOT 1.3 (H) 05/05/2017 1402   BILITOT 6.2 (H) 03/04/2017 0918   BILITOT 0.2 (L) 01/22/2015 1559             LABORATORY DATA:  I have reviewed the data as listed    Component Value Date/Time   NA 132 (L) 05/05/2017 1402   NA 135 03/04/2017 0918   NA 135 01/08/2015 0850   K 4.0 05/05/2017 1402   K 3.7 01/08/2015 0850   CL 100 (L) 05/05/2017 1402   CL 101 01/08/2015 0850   CO2 24 05/05/2017 1402   CO2 26 01/08/2015 0850   GLUCOSE 89 05/05/2017 1402   GLUCOSE 161 (H) 01/08/2015 0850   BUN 12 05/05/2017 1402   BUN 8 03/04/2017 0918   BUN 17 01/08/2015 0850   CREATININE 0.68 05/05/2017 1402   CREATININE 0.82 01/22/2015 1559   CALCIUM 9.0 05/05/2017 1402   CALCIUM 9.3 01/08/2015 0850   PROT 7.6 05/05/2017 1402   PROT 6.4 03/04/2017 0918   PROT 6.8 01/22/2015 1559   ALBUMIN 2.9 (L) 05/05/2017 1402   ALBUMIN 2.5 (L) 03/04/2017 0918   ALBUMIN 3.9 01/22/2015 1559   AST 56 (H) 05/05/2017 1402   AST 34 01/22/2015 1559   ALT 23 05/05/2017 1402   ALT 42 01/22/2015 1559   ALKPHOS 176 (H) 05/05/2017 1402   ALKPHOS 157 (H) 01/22/2015 1559   BILITOT 1.3 (H) 05/05/2017 1402   BILITOT 6.2 (H) 03/04/2017 0918   BILITOT 0.2 (L) 01/22/2015 1559   GFRNONAA >60 05/05/2017 1402  GFRNONAA >60 01/22/2015 1559   GFRAA >60 05/05/2017 1402   GFRAA >60 01/22/2015 1559    No results found for: SPEP, UPEP  Lab Results  Component Value Date   WBC 2.5 (L) 05/05/2017   NEUTROABS 1.5 05/05/2017   HGB 10.5 (L) 05/05/2017   HCT 31.2 (L) 05/05/2017   MCV 94.2 05/05/2017   PLT 233 05/05/2017      Chemistry      Component Value Date/Time   NA 132 (L) 05/05/2017 1402   NA 135 03/04/2017 0918   NA 135 01/08/2015 0850   K 4.0 05/05/2017 1402   K 3.7 01/08/2015 0850   CL 100 (L) 05/05/2017 1402   CL 101 01/08/2015 0850   CO2 24 05/05/2017 1402   CO2 26 01/08/2015 0850   BUN 12 05/05/2017 1402   BUN 8 03/04/2017 0918   BUN 17 01/08/2015 0850   CREATININE 0.68 05/05/2017 1402   CREATININE 0.82 01/22/2015 1559      Component Value Date/Time   CALCIUM 9.0 05/05/2017 1402   CALCIUM  9.3 01/08/2015 0850   ALKPHOS 176 (H) 05/05/2017 1402   ALKPHOS 157 (H) 01/22/2015 1559   AST 56 (H) 05/05/2017 1402   AST 34 01/22/2015 1559   ALT 23 05/05/2017 1402   ALT 42 01/22/2015 1559   BILITOT 1.3 (H) 05/05/2017 1402   BILITOT 6.2 (H) 03/04/2017 0918   BILITOT 0.2 (L) 01/22/2015 1559     IMPRESSION: Normal left ventricular wall motion with estimated ejection fraction of 65%.   Electronically Signed   By: Marijo Sanes M.D.   On: 07/27/2016 09:04  RADIOGRAPHIC STUDIES: I have personally reviewed the radiological images as listed and agreed with the findings in the report. No results found.   ASSESSMENT & PLAN:  Carcinoma of upper-outer quadrant of left breast in female, estrogen receptor positive ([place]) # RECURRENT Metastatic breast cancer- ER/PR positive HER-2/neu negative [status post liver biopsy February 2018; previously HER-2/neu positive]. Currently on palliative Faslodex+ Abemaciclib. Clinically improving; CT scan shows improvement of the liver lesions; tumor markers improving.  # Today white count is 2.5 ANC 1.5; continue to hold Abema at this time. Patient was given a prescription for 50 mg of abema at last visit. She was asked to bring at next visit.  # Liver failure- sec to progressive malignancy- clinical improvement noted. Not needing any ascites.  # Brain mets- s/p RT [finished Feb 28th]; symptomatic improvement.   MRI May 2018- improved.  # PN G-3 secondary to chemotherapy. Continue Lyrica; oxycodone once a day.  # Hypokalemia- continue K supp.   #  Debility/starting physical therapy- improvement noted.   # Follow-up in 2 weeks/labs/Faslodex.    Reviewed the lab work progress with the patient and her brother in detail.   No orders of the defined types were placed in this encounter.  All questions were answered. The patient knows to call the clinic with any problems, questions or concerns.      Cammie Sickle, MD 05/06/2017 7:53 AM

## 2017-05-06 ENCOUNTER — Telehealth: Payer: Self-pay | Admitting: Internal Medicine

## 2017-05-06 DIAGNOSIS — C50412 Malignant neoplasm of upper-outer quadrant of left female breast: Secondary | ICD-10-CM

## 2017-05-06 DIAGNOSIS — Z170 Estrogen receptor positive status [ER+]: Principal | ICD-10-CM

## 2017-05-06 LAB — CANCER ANTIGEN 27.29: CA 27.29: 19.5 U/mL (ref 0.0–38.6)

## 2017-05-06 NOTE — Telephone Encounter (Signed)
Follow-up in 2 weeks-MD/labs [cbc/cmp]/Faslodex; port flush.

## 2017-05-06 NOTE — Addendum Note (Signed)
Addended by: Sabino Gasser on: 05/06/2017 08:49 AM   Modules accepted: Orders

## 2017-05-06 NOTE — Telephone Encounter (Signed)
Labs entered per md order and msg sent to sch. Team to arrange for apts.

## 2017-05-09 NOTE — Therapy (Signed)
[facility] [address], [place], [address] Phone: [phone]   Fax:  [phone]  Occupational Therapy Evaluation  Patient Details  Name: TENIOLA TSENG MRN: [id number] Date of Birth: 04-01-1956 Referring Provider: Rogue Bussing  Encounter Date: 05/03/2017      OT End of Session - 05/09/17 2024    Visit Number 1   Number of Visits 12   Date for OT Re-Evaluation 06/14/17   Authorization Type BCBS   OT Start Time 1455   OT Stop Time 1556   OT Time Calculation (min) 61 min   Activity Tolerance Patient tolerated treatment well   Behavior During Therapy [facility] for tasks assessed/performed      Past Medical History:  Diagnosis Date  . Chemotherapy-induced peripheral neuropathy ([place])   . Epistaxis    a. 11/2016 in setting of asa/plavix-->silver nitrate cauterization.  . GI bleed    a. 11/2016 Admission w/ GIB and hypovolemic shock req 3u PRBC's;  b. 11/2016 ECG: gastritis & nonbleeding peptic ulcer; c. 11/2016 Conlonoscopy: rectal and sigmoid colonic ulcers.  . Heart attack ([place])    a. 1998 Cath @ [facility]: reportedly no intervention required.  Marland Kitchen Herceptin-induced cardiomyopathy ([place])    a. In the setting of Herceptin Rx for breast cancer (initiated 12/2014); b. 03/2015 MUGA EF 64%; b. 08/2015 MUGA: EF 51%; c. 10/2015 MUGA: EF 44%; d. 11/2015 Echo: EF 45-50%; e. 01/2016 MUGA: EF 60%; f. 06/2016 MUGA EF 65%; g. 10/2016 MUGA: EF 61%;  h. 12/2016 Echo: EF 55-60%, gr1 DD.  Marland Kitchen Hyperlipidemia   . Hypertension   . Neuropathy   . Possible PAD (peripheral artery disease) ([place])    a. 11/2016 LE cyanosis and weak pulses-->CTA w/o significant Ao-BiFem dzs. ? distal dzs-->ASA/Plavix initiated by vascular surgery.  Marland Kitchen PSVT (paroxysmal supraventricular tachycardia) ([place])    a. Dx 11/2016.  . Pulmonary embolism ([place])    a. 12/2016 CTA Chest: small nonocclusive PE in inferior segment of the Left lingula, somewhat eccentric filling defect  suggesting chronic rather than acute embolic event; b. 11/8248 LE U/S:  No DVT; c. 12/2016 Echo: Nl RV fxn, nl PASP.  Marland Kitchen Recurrent Metastatic breast cancer ([place])    a. Dx 2016: Stage II, ER positive, PR positive, HER-2/neu overexpressing of the left breast-->chemo/radiation; b. 10/2016 CT Abd/pelvis: diffuse liver mets, ill defined sclerotic bone lesions-T12;  c. 10/2016 MRI brain: metastatic lesion along L temporal lobe (19x38m) w/ extensive surrounding edema & 546mmidline shift to right.  . Sinus tachycardia     Past Surgical History:  Procedure Laterality Date  . BREAST BIOPSY Left 2016   Positive  . BREAST LUMPECTOMY WITH SENTINEL LYMPH NODE BIOPSY Left 05/23/2015   Procedure: LEFT BREAST WIDE EXCISION WITH AXILLARY DISSECTION, MASTOPLASTY ;  Surgeon: JeRobert BellowMD;  Location: [facility] ORS;  Service: General;  Laterality: Left;  . BREAST SURGERY Left 12/18/14   breast biopsy/INVASIVE DUCTAL CARCINOMA OF BREAST, NOTTINGHAM GRADE 2.  . Marland KitchenREAST SURGERY  05/23/2015.   Wide excision/mastoplasty, axillary dissection. No residual invasive cancer, positive for residual DCIS. 0/2 nodes identified on axillary dissection. (no SLN by technetium or methylene blue)  . CARDIAC CATHETERIZATION    . COLONOSCOPY WITH PROPOFOL N/A 12/10/2016   Procedure: COLONOSCOPY WITH PROPOFOL;  Surgeon: DaLucilla LameMD;  Location: [facility] ENDOSCOPY;  Service: Endoscopy;  Laterality: N/A;  . COLONOSCOPY WITH PROPOFOL N/A 02/13/2017   Procedure: COLONOSCOPY WITH PROPOFOL;  Surgeon: WoLucilla LameMD;  Location: [facility] ENDOSCOPY;  Service: Endoscopy;  Laterality: N/A;  . ESOPHAGOGASTRODUODENOSCOPY (EGD) WITH PROPOFOL N/A 12/08/2016   Procedure: ESOPHAGOGASTRODUODENOSCOPY (EGD) WITH PROPOFOL;  Surgeon: Lucilla Lame, MD;  Location: [facility] ENDOSCOPY;  Service: Endoscopy;  Laterality: N/A;  . IVC FILTER INSERTION N/A 02/15/2017   Procedure: IVC Filter Insertion;  Surgeon: Algernon Huxley, MD;  Location: [facility];  Service:  Cardiovascular;  Laterality: N/A;  . PORTACATH PLACEMENT Right 12-31-14   Dr Bary Castilla    There were no vitals filed for this visit.      Subjective Assessment - 05/08/17 2020    Subjective  Patient reports she has difficulty with peeling potatoes, onions, veggies, cleaning, cooking and laundry tasks.  She reports her handwriting is not very good and has trouble with buttons.   Pertinent History Patient reports she was diagnosed with breast cancer on the left side, partial mastectomy 12/2014 and had chemo and radiation.  Lymph nodes removed but unsure of number.  She has had pain in both hands since chemo treatments now with neuropathy and describes as numbness, tingling and aching all the time.  Diagnosed with bilateral hand weakness.    Patient Stated Goals Patient reports she would like to be able to write and do all the things she did before.    Currently in Pain? Yes   Pain Score 4    Pain Location Hand   Pain Orientation Right;Left   Pain Descriptors / Indicators Aching;Numbness;Pins and needles   Pain Type Chronic pain   Pain Onset More than a month ago   Pain Frequency Constant   Multiple Pain Sites No           [facility] OT Assessment - 05/08/17 2106      Assessment   Diagnosis bilateral hand weakness, neuropathy   Referring Provider brahmanday   Onset Date 12/29/14     Precautions   Precaution Comments metastatic cancer     Balance Screen   Has the patient fallen in the past 6 months No   Has the patient had a decrease in activity level because of a fear of falling?  No   Is the patient reluctant to leave their home because of a fear of falling?  No     Home  Environment   Family/patient expects to be discharged to: Private residence   Living Arrangements Alone   Available Help at Discharge Family   Type of [place] One level     Prior Function   Level of [place] Requirements Worked at [employer] until  10-2016 now on medical leave     ADL   Eating/Feeding Other (comment )   Grooming Modified independent   Upper Body Bathing Modified independent   Lower Body Bathing Modified independent   Upper Body Dressing Increased time;Needs assist for fasteners   Lower Body Dressing Needs assist for fasteners;Increased time   Toilet Transfer Modified independent   Toileting - Clothing Manipulation Increased time   Toileting -  Journalist, newspaper seat with back   ADL comments Patient demonstrates difficulty with peeling potatoes, cutting vegetables, cooking, cleaning, laundry, managing buttons, opening medication bottles, drink bottles, pouring water from a pot to a pitcher, lifting heavy dish from the oven, making her bed and handwriting.       IADL   Prior Level of Function Shopping independent   Shopping Completely  unable to shop   Prior Level of Function Light Housekeeping independent   Light Housekeeping Performs light daily tasks such as dishwashing, bed making   Prior Level of Function Meal Prep independent   Meal Prep Able to complete simple warm meal prep   Community Mobility Relies on family or friends for transportation   Prior Level of Function Meal Prep independent   Medication Management Takes responsibility if medication is prepared in advance in seperate dosage   Prior Level of Function Financial Management independent   Physiological scientist financial matters independently (budgets, writes checks, pays rent, bills goes to bank), collects and keeps track of income     Mobility   Mobility Status Needs assist   Mobility Status Comments ambulates with walker     Written Expression   Handwriting 50% legible     Vision - History   Baseline Vision Wears glasses all the time     Cognition   Overall Cognitive Status Within Functional Limits for tasks assessed     Sensation    Hot/Cold Appears Intact   Additional Comments numbness and tingling in both hands     Coordination   Fine Motor Movements are Fluid and Coordinated Yes   Coordination and Movement Description intact bilaterally   Finger Nose Finger Test intact bilaterally     Edema   Edema no edema present     Strength   Right Shoulder Flexion 4/5   Right Shoulder ABduction 4/5   Right Shoulder Internal Rotation 4/5   Right Shoulder External Rotation 4-/5   Left Shoulder Flexion 4/5   Left Shoulder ABduction 4/5   Left Shoulder Internal Rotation 4-/5   Left Shoulder External Rotation 4-/5   Right Elbow Flexion 4-/5   Right Elbow Extension 4/5   Left Elbow Flexion 4-/5   Left Elbow Extension 4/5     Hand Function   Right Hand Grip (lbs) 8   Right Hand Lateral Pinch 3 lbs   Right Hand 3 Point Pinch 2 lbs   Left Hand Grip (lbs) 16   Left Hand Lateral Pinch 4 lbs   Left 3 point pinch 4 lbs     ROM of bilateral hands WNLs, full opposition to each digit on BUE, coordination intact for rapid alt movements, finger to nose.   Patient does have a cyst on her right forearm with firm presentation.   Quick Dash score of 70.5   Patient instructed on putty exercises for bilateral hands for grip, pinch skills for home program.                 OT Education - 05/09/17 2023    Education provided Yes   Education Details home exercises for grip. pinch with resistive putty   Person(s) Educated Patient   Methods Explanation;Demonstration;Tactile cues;Verbal cues   Comprehension Verbal cues required;Returned demonstration;Verbalized understanding;Tactile cues required             OT Long Term Goals - 05/09/17 2113      OT LONG TERM GOAL #1   Title Patient will demonstrate home exercise program with modified independence.    Baseline none   Time 3   Period Weeks   Status New   Target Date 05/24/17     OT LONG TERM GOAL #2   Title Patient will increase grip strength in bilateral  hands by 10# to assist with holding objects and opening containers.    Baseline R 8#, L 16#   Time 6  Period Weeks   Status New   Target Date 06/14/17     OT LONG TERM GOAL #3   Title Patient will complete peeling and cutting vegetables with modified independence using adaptive equipment as needed.    Baseline moderate assist   Time 6   Period Weeks   Status New   Target Date 06/14/17     OT LONG TERM GOAL #4   Title Patient will improve pinch by 2# to assist with pulling button out on washer with modified independence.    Baseline limited pinch BUE   Time 6   Period Weeks   Status New   Target Date 06/14/17     OT LONG TERM GOAL #5   Title Patient will report pain in bilateral hands 2 or less to perform daily tasks.    Baseline 4 at eval   Time 6   Period Weeks   Status New   Target Date 06/14/17               Plan - 05/09/17 2110    Clinical Impression Statement Patient is a 61 yo female who was diagnosed with bilateral hand weakness with neuropathy after recurrent cancer and subsequent rounds of chemo.  She reports difficulty with peeling potatoes, cutting vegetables, cooking, cleaning, laundry, managing buttons, opening medication bottles, drink bottles, pouring water from a pot to a pitcher, lifting heavy dish from the oven, making her bed and handwriting.  She demonstrates muscle weakness in bilateral UEs affecting ADL and IADL tasks and would benefit from skilled OT to maximize safety and independence in necessary daily tasks.     Occupational Profile and client history currently impacting functional performance Patient with history of cancer with chemo treatments resulting in neuropathy of bilateral hands.   Occupational performance deficits (Please refer to evaluation for details): ADL's;IADL's;Leisure;Work   Rehab Potential Good   Current Impairments/barriers affecting progress: Age, comorbidities, history of cancer with chemo tx and neuropathy.     OT  Frequency 2x / week   OT Duration 6 weeks   OT Treatment/Interventions Self-care/ADL training;Moist Heat;Fluidtherapy;DME and/or AE instruction;Patient/family education;Splinting;Contrast Bath;Therapeutic exercises;Ultrasound;Therapeutic exercise;Therapeutic activities;Neuromuscular education;Parrafin;Energy conservation;Manual Therapy   Clinical Decision Making Limited treatment options, no task modification necessary   Consulted and Agree with Plan of Care Patient      Patient will benefit from skilled therapeutic intervention in order to improve the following deficits and impairments:  Impaired UE functional use, Pain, Decreased strength  Visit Diagnosis: Muscle weakness (generalized)  Pain in right hand  Pain in left hand    Problem List Patient Active Problem List   Diagnosis Date Noted  . Blood in stool   . Polyp of sigmoid colon   . Benign neoplasm of transverse colon   . Benign neoplasm of ascending colon   . Lower GI bleed   . Weakness of right lower extremity 02/11/2017  . Metastatic breast cancer ([place]) 01/18/2017  . Orthostatic lightheadedness 01/14/2017  . Atrial tachycardia ([place]) 01/14/2017  . Hypokalemia 01/14/2017  . Pulmonary emboli ([place]) 01/12/2017  . Dehydration 12/25/2016  . Blood loss anemia 12/15/2016  . Ulceration of colon   . Heartburn   . Duodenitis   . Gastritis without bleeding   . GI bleed   . Acute esophagitis   . Acute esophagogastric ulcer   . Rectal bleed   . Hypovolemic shock ([place])   . Ischemic leg 12/04/2016  . Chemotherapy induced neutropenia ([place]) 11/20/2016  . Palliative care by specialist   .  Goals of care, counseling/discussion   . DNR (do not resuscitate) discussion   . Cerebral edema ([place]) 11/10/2016  . Encounter for monitoring cardiotoxic drug therapy 11/09/2016  . Elevated LFTs 10/20/2016  . URI with cough and congestion 08/25/2016  . Peripheral neuropathy due to chemotherapy ([place]) 03/13/2016  . Encounter for  antineoplastic chemotherapy 03/13/2016  . HLD (hyperlipidemia) 01/29/2016  . Hyperglycemia, unspecified 01/29/2016  . Cardiomyopathy ([place]) 01/13/2016  . Hypertension 07/18/2015  . Carcinoma of upper-outer quadrant of left breast in female, estrogen receptor positive ([place]) 12/27/2014   Amy T Lovett, OTR/L, CLT  Lovett,Amy 05/09/2017, 9:20 PM  [facility] [address] [place], [address] Phone: [phone]   Fax:  [phone]  Name: LIRIDONA MASHAW MRN: [id number] Date of Birth: [birth year]

## 2017-05-11 ENCOUNTER — Ambulatory Visit: Payer: BLUE CROSS/BLUE SHIELD

## 2017-05-11 ENCOUNTER — Ambulatory Visit: Payer: BLUE CROSS/BLUE SHIELD | Admitting: Occupational Therapy

## 2017-05-11 DIAGNOSIS — M79641 Pain in right hand: Secondary | ICD-10-CM | POA: Diagnosis not present

## 2017-05-11 DIAGNOSIS — M6281 Muscle weakness (generalized): Secondary | ICD-10-CM

## 2017-05-11 DIAGNOSIS — T451X5A Adverse effect of antineoplastic and immunosuppressive drugs, initial encounter: Principal | ICD-10-CM

## 2017-05-11 DIAGNOSIS — D701 Agranulocytosis secondary to cancer chemotherapy: Secondary | ICD-10-CM

## 2017-05-11 DIAGNOSIS — M79642 Pain in left hand: Secondary | ICD-10-CM | POA: Diagnosis not present

## 2017-05-11 DIAGNOSIS — R262 Difficulty in walking, not elsewhere classified: Secondary | ICD-10-CM | POA: Diagnosis not present

## 2017-05-11 DIAGNOSIS — G620 Drug-induced polyneuropathy: Secondary | ICD-10-CM | POA: Diagnosis not present

## 2017-05-11 NOTE — Therapy (Signed)
[facility] [address] [place], [address] Phone: [phone]   Fax:  [phone]  Occupational Therapy Treatment  Patient Details  Name: Sarah Horne MRN: [id number] Date of Birth: Nov 22, 1955 Referring Provider: Rogue Bussing  Encounter Date: 05/11/2017      OT End of Session - 05/11/17 1437    Visit Number 2   Number of Visits 12   Date for OT Re-Evaluation 06/14/17   Authorization Type BCBS   OT Start Time 1345   OT Stop Time 1425   OT Time Calculation (min) 40 min   Activity Tolerance Patient tolerated treatment well   Behavior During Therapy [facility] for tasks assessed/performed      Past Medical History:  Diagnosis Date  . Chemotherapy-induced peripheral neuropathy ([place])   . Epistaxis    a. 11/2016 in setting of asa/plavix-->silver nitrate cauterization.  . GI bleed    a. 11/2016 Admission w/ GIB and hypovolemic shock req 3u PRBC's;  b. 11/2016 ECG: gastritis & nonbleeding peptic ulcer; c. 11/2016 Conlonoscopy: rectal and sigmoid colonic ulcers.  . Heart attack ([place])    a. 1998 Cath @ [facility]: reportedly no intervention required.  Marland Kitchen Herceptin-induced cardiomyopathy ([place])    a. In the setting of Herceptin Rx for breast cancer (initiated 12/2014); b. 03/2015 MUGA EF 64%; b. 08/2015 MUGA: EF 51%; c. 10/2015 MUGA: EF 44%; d. 11/2015 Echo: EF 45-50%; e. 01/2016 MUGA: EF 60%; f. 06/2016 MUGA EF 65%; g. 10/2016 MUGA: EF 61%;  h. 12/2016 Echo: EF 55-60%, gr1 DD.  Marland Kitchen Hyperlipidemia   . Hypertension   . Neuropathy   . Possible PAD (peripheral artery disease) ([place])    a. 11/2016 LE cyanosis and weak pulses-->CTA w/o significant Ao-BiFem dzs. ? distal dzs-->ASA/Plavix initiated by vascular surgery.  Marland Kitchen PSVT (paroxysmal supraventricular tachycardia) ([place])    a. Dx 11/2016.  . Pulmonary embolism ([place])    a. 12/2016 CTA Chest: small nonocclusive PE in inferior segment of the Left lingula, somewhat eccentric filling defect  suggesting chronic rather than acute embolic event; b. 02/5536 LE U/S:  No DVT; c. 12/2016 Echo: Nl RV fxn, nl PASP.  Marland Kitchen Recurrent Metastatic breast cancer ([place])    a. Dx 2016: Stage II, ER positive, PR positive, HER-2/neu overexpressing of the left breast-->chemo/radiation; b. 10/2016 CT Abd/pelvis: diffuse liver mets, ill defined sclerotic bone lesions-T12;  c. 10/2016 MRI brain: metastatic lesion along L temporal lobe (19x51m) w/ extensive surrounding edema & 511mmidline shift to right.  . Sinus tachycardia     Past Surgical History:  Procedure Laterality Date  . BREAST BIOPSY Left 2016   Positive  . BREAST LUMPECTOMY WITH SENTINEL LYMPH NODE BIOPSY Left 05/23/2015   Procedure: LEFT BREAST WIDE EXCISION WITH AXILLARY DISSECTION, MASTOPLASTY ;  Surgeon: JeRobert BellowMD;  Location: [facility] ORS;  Service: General;  Laterality: Left;  . BREAST SURGERY Left 12/18/14   breast biopsy/INVASIVE DUCTAL CARCINOMA OF BREAST, NOTTINGHAM GRADE 2.  . Marland KitchenREAST SURGERY  05/23/2015.   Wide excision/mastoplasty, axillary dissection. No residual invasive cancer, positive for residual DCIS. 0/2 nodes identified on axillary dissection. (no SLN by technetium or methylene blue)  . CARDIAC CATHETERIZATION    . COLONOSCOPY WITH PROPOFOL N/A 12/10/2016   Procedure: COLONOSCOPY WITH PROPOFOL;  Surgeon: DaLucilla LameMD;  Location: [facility] ENDOSCOPY;  Service: Endoscopy;  Laterality: N/A;  . COLONOSCOPY WITH PROPOFOL N/A 02/13/2017   Procedure: COLONOSCOPY WITH PROPOFOL;  Surgeon: WoLucilla LameMD;  Location: [facility] ENDOSCOPY;  Service: Endoscopy;  Laterality: N/A;  . ESOPHAGOGASTRODUODENOSCOPY (EGD) WITH PROPOFOL N/A 12/08/2016   Procedure: ESOPHAGOGASTRODUODENOSCOPY (EGD) WITH PROPOFOL;  Surgeon: Lucilla Lame, MD;  Location: [facility] ENDOSCOPY;  Service: Endoscopy;  Laterality: N/A;  . IVC FILTER INSERTION N/A 02/15/2017   Procedure: IVC Filter Insertion;  Surgeon: Algernon Huxley, MD;  Location: [facility];  Service:  Cardiovascular;  Laterality: N/A;  . PORTACATH PLACEMENT Right 12-31-14   Dr Bary Castilla    There were no vitals filed for this visit.      Subjective Assessment - 05/11/17 1434    Subjective  I could tie my shoes and could even do my buttons this am - without plyers- was slow - and throwing tea with pitcher - holding with my other hand underneath   Pertinent History Patient reports she was diagnosed with breast cancer on the left side, partial mastectomy 12/2014 and had chemo and radiation.  Lymph nodes removed but unsure of number.  She has had pain in both hands since chemo treatments now with neuropathy and describes as numbness, tingling and aching all the time.  Diagnosed with bilateral hand weakness.    Patient Stated Goals Patient reports she would like to be able to write and do all the things she did before.    Currently in Pain? Yes   Pain Score 4    Pain Location Hand   Pain Orientation Right;Left   Pain Descriptors / Indicators Pins and needles;Aching;Numbness   Pain Type Chronic pain            [facility] OT Assessment - 05/11/17 0001      Strength   Right Hand Grip (lbs) 19   Right Hand Lateral Pinch 4 lbs   Right Hand 3 Point Pinch 4 lbs   Left Hand Grip (lbs) 22   Left Hand Lateral Pinch 5 lbs   Left Hand 3 Point Pinch 4 lbs      assess grip and prehension strength   and preforming HEP assess with green putty  change lat and 3 point grip - to keeping in cylinder to provide enough resistance  And pinch into middle of putty   need mod A  Small objects in putty - and use pinch and 3 point pinch to remove   can do 2 sets at day -or 2 sessions  YTB for elbow extention and flexion - 12 reps   and add to HEP   Discuss with pt neuropathy and need to compensate with vision and AE   jar openers  Electric can opener Pen again or large grip pens - is improving  Spring loaded scissors for increase ease of open packages  [employer] brand for enlarge handles - or ring peeler Large  nail clipper  Button hook - hand out provided                     OT Education - 05/11/17 1436    Education provided Yes   Education Details upgrade her HEP    Person(s) Educated Patient   Methods Explanation;Demonstration;Tactile cues;Verbal cues;Handout   Comprehension Verbal cues required;Returned demonstration;Verbalized understanding             OT Long Term Goals - 05/09/17 2113      OT LONG TERM GOAL #1   Title Patient will demonstrate home exercise program with modified independence.    Baseline none   Time 3   Period Weeks   Status New   Target Date  05/24/17     OT LONG TERM GOAL #2   Title Patient will increase grip strength in bilateral hands by 10# to assist with holding objects and opening containers.    Baseline R 8#, L 16#   Time 6   Period Weeks   Status New   Target Date 06/14/17     OT LONG TERM GOAL #3   Title Patient will complete peeling and cutting vegetables with modified independence using adaptive equipment as needed.    Baseline moderate assist   Time 6   Period Weeks   Status New   Target Date 06/14/17     OT LONG TERM GOAL #4   Title Patient will improve pinch by 2# to assist with pulling button out on washer with modified independence.    Baseline limited pinch BUE   Time 6   Period Weeks   Status New   Target Date 06/14/17     OT LONG TERM GOAL #5   Title Patient will report pain in bilateral hands 2 or less to perform daily tasks.    Baseline 4 at eval   Time 6   Period Weeks   Status New   Target Date 06/14/17               Plan - 05/11/17 1439    Clinical Impression Statement Pt report increase functional use of hands , show increase grip strength in bilateral hands more than prehension - change HEP , and ed on AE to compensate for neuropathy    Occupational performance deficits (Please refer to evaluation for details): ADL's;IADL's;Work;Leisure   Rehab Potential Good   Current  Impairments/barriers affecting progress: Age, comorbidities, history of cancer with chemo tx and neuropathy.     OT Frequency 1x / week   OT Duration 4 weeks   OT Treatment/Interventions Self-care/ADL training;Moist Heat;Fluidtherapy;DME and/or AE instruction;Patient/family education;Splinting;Contrast Bath;Therapeutic exercises;Ultrasound;Therapeutic exercise;Therapeutic activities;Neuromuscular education;Parrafin;Energy conservation;Manual Therapy   Clinical Decision Making Several treatment options, min-mod task modification necessary   Consulted and Agree with Plan of Care Patient      Patient will benefit from skilled therapeutic intervention in order to improve the following deficits and impairments:  Impaired UE functional use, Pain, Decreased strength  Visit Diagnosis: Chemotherapy induced neutropenia [facility])    Problem List Patient Active Problem List   Diagnosis Date Noted  . Blood in stool   . Polyp of sigmoid colon   . Benign neoplasm of transverse colon   . Benign neoplasm of ascending colon   . Lower GI bleed   . Weakness of right lower extremity 02/11/2017  . Metastatic breast cancer ([place]) 01/18/2017  . Orthostatic lightheadedness 01/14/2017  . Atrial tachycardia ([place]) 01/14/2017  . Hypokalemia 01/14/2017  . Pulmonary emboli ([place]) 01/12/2017  . Dehydration 12/25/2016  . Blood loss anemia 12/15/2016  . Ulceration of colon   . Heartburn   . Duodenitis   . Gastritis without bleeding   . GI bleed   . Acute esophagitis   . Acute esophagogastric ulcer   . Rectal bleed   . Hypovolemic shock ([place])   . Ischemic leg 12/04/2016  . Chemotherapy induced neutropenia ([place]) 11/20/2016  . Palliative care by specialist   . Goals of care, counseling/discussion   . DNR (do not resuscitate) discussion   . Cerebral edema ([place]) 11/10/2016  . Encounter for monitoring cardiotoxic drug therapy 11/09/2016  . Elevated LFTs 10/20/2016  . URI with cough and congestion 08/25/2016  .  Peripheral neuropathy due to  chemotherapy ([place]) 03/13/2016  . Encounter for antineoplastic chemotherapy 03/13/2016  . HLD (hyperlipidemia) 01/29/2016  . Hyperglycemia, unspecified 01/29/2016  . Cardiomyopathy ([place]) 01/13/2016  . Hypertension 07/18/2015  . Carcinoma of upper-outer quadrant of left breast in female, estrogen receptor positive ([place]) 12/27/2014    Rosalyn Gess OTR/L,CLT 05/11/2017, 2:42 PM  [facility] [address] [place], [address] Phone: [phone]   Fax:  [phone]  Name: TANEYA CONKEL MRN: [id number] Date of Birth: 04/18/1956

## 2017-05-11 NOTE — Therapy (Signed)
[facility] [address], [place], [address] Phone: [phone]   Fax:  [phone]  Physical Therapy Treatment  Patient Details  Name: Sarah Horne MRN: [id number] Date of Birth: December 03, 1955 Referring Provider: Charlaine Dalton, MD  Encounter Date: 05/11/2017      PT End of Session - 05/11/17 1302    Visit Number 7   Number of Visits 13   Date for PT Re-Evaluation 06/03/17   PT Start Time 1302   PT Stop Time 1330   PT Time Calculation (min) 28 min   Equipment Utilized During Treatment Gait belt  rw, SPC   Activity Tolerance Patient tolerated treatment well   Behavior During Therapy [facility] for tasks assessed/performed      Past Medical History:  Diagnosis Date  . Chemotherapy-induced peripheral neuropathy ([place])   . Epistaxis    a. 11/2016 in setting of asa/plavix-->silver nitrate cauterization.  . GI bleed    a. 11/2016 Admission w/ GIB and hypovolemic shock req 3u PRBC's;  b. 11/2016 ECG: gastritis & nonbleeding peptic ulcer; c. 11/2016 Conlonoscopy: rectal and sigmoid colonic ulcers.  . Heart attack ([place])    a. 1998 Cath @ [facility]: reportedly no intervention required.  Marland Kitchen Herceptin-induced cardiomyopathy ([place])    a. In the setting of Herceptin Rx for breast cancer (initiated 12/2014); b. 03/2015 MUGA EF 64%; b. 08/2015 MUGA: EF 51%; c. 10/2015 MUGA: EF 44%; d. 11/2015 Echo: EF 45-50%; e. 01/2016 MUGA: EF 60%; f. 06/2016 MUGA EF 65%; g. 10/2016 MUGA: EF 61%;  h. 12/2016 Echo: EF 55-60%, gr1 DD.  Marland Kitchen Hyperlipidemia   . Hypertension   . Neuropathy   . Possible PAD (peripheral artery disease) ([place])    a. 11/2016 LE cyanosis and weak pulses-->CTA w/o significant Ao-BiFem dzs. ? distal dzs-->ASA/Plavix initiated by vascular surgery.  Marland Kitchen PSVT (paroxysmal supraventricular tachycardia) ([place])    a. Dx 11/2016.  . Pulmonary embolism ([place])    a. 12/2016 CTA Chest: small nonocclusive PE in inferior segment of the Left lingula,  somewhat eccentric filling defect suggesting chronic rather than acute embolic event; b. 12/3327 LE U/S:  No DVT; c. 12/2016 Echo: Nl RV fxn, nl PASP.  Marland Kitchen Recurrent Metastatic breast cancer ([place])    a. Dx 2016: Stage II, ER positive, PR positive, HER-2/neu overexpressing of the left breast-->chemo/radiation; b. 10/2016 CT Abd/pelvis: diffuse liver mets, ill defined sclerotic bone lesions-T12;  c. 10/2016 MRI brain: metastatic lesion along L temporal lobe (19x4m) w/ extensive surrounding edema & 52mmidline shift to right.  . Sinus tachycardia     Past Surgical History:  Procedure Laterality Date  . BREAST BIOPSY Left 2016   Positive  . BREAST LUMPECTOMY WITH SENTINEL LYMPH NODE BIOPSY Left 05/23/2015   Procedure: LEFT BREAST WIDE EXCISION WITH AXILLARY DISSECTION, MASTOPLASTY ;  Surgeon: JeRobert BellowMD;  Location: [facility] ORS;  Service: General;  Laterality: Left;  . BREAST SURGERY Left 12/18/14   breast biopsy/INVASIVE DUCTAL CARCINOMA OF BREAST, NOTTINGHAM GRADE 2.  . Marland KitchenREAST SURGERY  05/23/2015.   Wide excision/mastoplasty, axillary dissection. No residual invasive cancer, positive for residual DCIS. 0/2 nodes identified on axillary dissection. (no SLN by technetium or methylene blue)  . CARDIAC CATHETERIZATION    . COLONOSCOPY WITH PROPOFOL N/A 12/10/2016   Procedure: COLONOSCOPY WITH PROPOFOL;  Surgeon: DaLucilla LameMD;  Location: [facility] ENDOSCOPY;  Service: Endoscopy;  Laterality: N/A;  . COLONOSCOPY WITH PROPOFOL N/A 02/13/2017   Procedure: COLONOSCOPY WITH PROPOFOL;  Surgeon: Lucilla Lame, MD;  Location: [facility] ENDOSCOPY;  Service: Endoscopy;  Laterality: N/A;  . ESOPHAGOGASTRODUODENOSCOPY (EGD) WITH PROPOFOL N/A 12/08/2016   Procedure: ESOPHAGOGASTRODUODENOSCOPY (EGD) WITH PROPOFOL;  Surgeon: Lucilla Lame, MD;  Location: [facility] ENDOSCOPY;  Service: Endoscopy;  Laterality: N/A;  . IVC FILTER INSERTION N/A 02/15/2017   Procedure: IVC Filter Insertion;  Surgeon: Algernon Huxley, MD;  Location: [facility];  Service: Cardiovascular;  Laterality: N/A;  . PORTACATH PLACEMENT Right 12-31-14   Dr Bary Castilla    There were no vitals filed for this visit.      Subjective Assessment - 05/11/17 1306    Subjective Pt states doing fine, no pain other than her hands and feet. Walking is doing ok. Stepped down off a curb holding onto her brother's arm. Went ok.    Pertinent History Bilateral UE and LE weakness. Pt states that she feels like her weakness came from her neuropathy. Difficulty writing. Both hands feel numb currently but can feel her hand when she touches it. Has had the weakness for about 3 months (since April 2018) which began gradually.  Went through chemo and radiation last year for about 2.5 months until August 2017.  Still has cancer in her L breast which also traveled to her brain and liver.   Pt also lost 10 lbs since she was diagnosed with cancer 2 years ago.  Apetite is fine. Normally eats breakfast at times. Normally eats one good meal during the day and eats junk in between such as cookies and sodas.  Eats a frozen dinner during lunch. Does not know if she gets enough protein.   Only has pain in her feet and hands bilaterally.  No neck or back pain.   Has been using a rw since May 2018.  Was independent with gait before May 2018.  Pt goal is to improve her function.    Patient Stated Goals I want to be able to get out of the walker and not use a cane. Be able to do all the things she used to do such as cooking a meal, laundry,    Currently in Pain? Other (Comment)  hands and feet, no pain level provided                                 PT Education - 05/11/17 1327    Education provided Yes   Education Details ther-ex   [employer]) Educated Patient   Methods Explanation;Demonstration;Tactile cues;Verbal cues   Comprehension Returned demonstration;Verbalized understanding        Objectives   There-ex  Pt observed to ambulate into clinic  with her [facility]   Blood pressure R arm sitting, mechanically taken: 112/71, HR 112 SpO2 98% room Air  Sit <> stand from regular chair and 2 pillows, bilateral UE assist to stand, no UE to sit, emphasis on femoral control and not pushing back against the chair the her knees. 5x32  Then with one pillow 5x   Able to perform without pushing her knees back against chair. Improved femoral control compared to previous sessions   Forward step up onto and off 3 inch step with contralateral SPC assist 5x3 each LE             To promote ability negotiate curbs. CGA only today.   Sitting on dyna-disc to promote core muscle activation  Low rows resisting yellow band 10x3 with 5 second holds    Stepping over a fallen cane using SPC supoorting R LE, then with L LE without SPC assist, CGA 10x for R, 5x for L   Improved exercise technique, movement at target joints, use of target muscles after min to mod verbal, visual, tactile cues.   Improved ability to step over obstacles such as a fallen cane and stepping onto and off a curb after cues for placing and maintaining her center of gravity over base of support and after increased practice. Better able to stand up and sit down onto a chair with 1-2 pillows without pushing her knees back onto the chair and with improved femoral control compared to previous sessions. Pt tolerated session well without aggravation of symptoms.           PT Long Term Goals - 04/20/17 1957      PT LONG TERM GOAL #1   Title Patient will be able to perform her TUG time using a rw to 18 seconds or less to promote functional mobility.   Baseline 25.3 seconds average with rw (04/20/2017)   Time 6   Period Weeks   Status New   Target Date 06/03/17     PT LONG TERM GOAL #2   Title Patient will improve bilateral UE and bilateral hip, and knee strength to promote ability to perform functional tasks at home.    Time 6   Period Weeks   Status New   Target Date  06/03/17     PT LONG TERM GOAL #3   Title Patient will improve her LEFS score by at least 9 points as a demonstration of improved function.    Baseline 25/80 (04/20/2017)   Time 6   Period Weeks   Status New   Target Date 06/03/17     PT LONG TERM GOAL #4   Title Patient will improve her Quick Dash Disability/Symptom score by at least 15% as a demonstration of improved function.    Baseline 70.45% (04/20/2017)   Time 6   Period Weeks   Status New   Target Date 06/03/17               Plan - 05/11/17 1328    Clinical Impression Statement Improved ability to step over obstacles such as a fallen cane and stepping onto and off a curb after cues for placing and maintaining her center of gravity over base of support and after increased practice. Better able to stand up and sit down onto a chair with 1-2 pillows without pushing her knees back onto the chair and with improved femoral control compared to previous sessions. Pt tolerated session well without aggravation of symptoms.    History and Personal Factors relevant to plan of care: Hx of CA, weakness, multiple health problems   Clinical Presentation Stable   Clinical Presentation due to: better able to perform sit <> stand transfers on chair with 1-2 pillows without pushing her knees back onto the chair and with better femoral control, improved ability to step up onto and over 3 inch step and over a fallen cane obstacle.    Clinical Decision Making Low   Rehab Potential Fair   Clinical Impairments Affecting Rehab Potential hx of CA, multiple health problems   PT Frequency 2x / week   PT Duration 6 weeks   PT Treatment/Interventions Manual techniques;Patient/family education;Therapeutic activities;Therapeutic exercise;Gait training;Neuromuscular re-education;Balance training   PT Next Visit Plan hip, scapular, LE, UE  strengthening, gait, balance, femoral control   Consulted and Agree with Plan of Care Patient      Patient will  benefit from skilled therapeutic intervention in order to improve the following deficits and impairments:  Pain, Improper body mechanics, Postural dysfunction, Impaired UE functional use, Decreased strength, Difficulty walking, Decreased endurance  Visit Diagnosis: Difficulty in walking, not elsewhere classified  Muscle weakness (generalized)     Problem List Patient Active Problem List   Diagnosis Date Noted  . Blood in stool   . Polyp of sigmoid colon   . Benign neoplasm of transverse colon   . Benign neoplasm of ascending colon   . Lower GI bleed   . Weakness of right lower extremity 02/11/2017  . Metastatic breast cancer ([place]) 01/18/2017  . Orthostatic lightheadedness 01/14/2017  . Atrial tachycardia ([place]) 01/14/2017  . Hypokalemia 01/14/2017  . Pulmonary emboli ([place]) 01/12/2017  . Dehydration 12/25/2016  . Blood loss anemia 12/15/2016  . Ulceration of colon   . Heartburn   . Duodenitis   . Gastritis without bleeding   . GI bleed   . Acute esophagitis   . Acute esophagogastric ulcer   . Rectal bleed   . Hypovolemic shock ([place])   . Ischemic leg 12/04/2016  . Chemotherapy induced neutropenia ([place]) 11/20/2016  . Palliative care by specialist   . Goals of care, counseling/discussion   . DNR (do not resuscitate) discussion   . Cerebral edema ([place]) 11/10/2016  . Encounter for monitoring cardiotoxic drug therapy 11/09/2016  . Elevated LFTs 10/20/2016  . URI with cough and congestion 08/25/2016  . Peripheral neuropathy due to chemotherapy ([place]) 03/13/2016  . Encounter for antineoplastic chemotherapy 03/13/2016  . HLD (hyperlipidemia) 01/29/2016  . Hyperglycemia, unspecified 01/29/2016  . Cardiomyopathy ([place]) 01/13/2016  . Hypertension 07/18/2015  . Carcinoma of upper-outer quadrant of left breast in female, estrogen receptor positive ([place]) 12/27/2014    Joneen Boers PT, DPT   05/11/2017, 2:52 PM  [facility] [address] [place], [address] Phone: [phone]   Fax:  [phone]   Name: Sarah Horne MRN: [id number] Date of Birth: Jan 24, 1956

## 2017-05-13 ENCOUNTER — Ambulatory Visit: Payer: BLUE CROSS/BLUE SHIELD

## 2017-05-13 ENCOUNTER — Telehealth: Payer: Self-pay | Admitting: *Deleted

## 2017-05-13 ENCOUNTER — Ambulatory Visit: Payer: BLUE CROSS/BLUE SHIELD | Admitting: Occupational Therapy

## 2017-05-13 DIAGNOSIS — M6281 Muscle weakness (generalized): Secondary | ICD-10-CM | POA: Diagnosis not present

## 2017-05-13 DIAGNOSIS — R262 Difficulty in walking, not elsewhere classified: Secondary | ICD-10-CM

## 2017-05-13 DIAGNOSIS — M79641 Pain in right hand: Secondary | ICD-10-CM | POA: Diagnosis not present

## 2017-05-13 DIAGNOSIS — G620 Drug-induced polyneuropathy: Secondary | ICD-10-CM | POA: Diagnosis not present

## 2017-05-13 DIAGNOSIS — M79642 Pain in left hand: Secondary | ICD-10-CM | POA: Diagnosis not present

## 2017-05-13 DIAGNOSIS — T451X5A Adverse effect of antineoplastic and immunosuppressive drugs, initial encounter: Secondary | ICD-10-CM | POA: Diagnosis not present

## 2017-05-13 NOTE — Telephone Encounter (Signed)
Patient asking if she is to restart her Verzen[employer], she never heard back from [place] when the computers went down. Please advise

## 2017-05-13 NOTE — Therapy (Signed)
[place] [facility] [address] [place], [address] Phone: [phone]   Fax:  [phone]  Physical Therapy Treatment  Patient Details  Name: Sarah Horne MRN: [id number] Date of Birth: Jun 06, 1956 Referring Provider: Charlaine Dalton, MD  Encounter Date: 05/13/2017      PT End of Session - 05/13/17 1258    Visit Number 8   Number of Visits 13   Date for PT Re-Evaluation 06/03/17   PT Start Time 1300   PT Stop Time 1342   PT Time Calculation (min) 42 min   Equipment Utilized During Treatment Gait belt  rw, SPC   Activity Tolerance Patient tolerated treatment well   Behavior During Therapy [facility] for tasks assessed/performed      Past Medical History:  Diagnosis Date  . Chemotherapy-induced peripheral neuropathy ([place])   . Epistaxis    a. 11/2016 in setting of asa/plavix-->silver nitrate cauterization.  . GI bleed    a. 11/2016 Admission w/ GIB and hypovolemic shock req 3u PRBC's;  b. 11/2016 ECG: gastritis & nonbleeding peptic ulcer; c. 11/2016 Conlonoscopy: rectal and sigmoid colonic ulcers.  . Heart attack ([place])    a. 1998 Cath @ [facility]: reportedly no intervention required.  Marland Kitchen Herceptin-induced cardiomyopathy ([place])    a. In the setting of Herceptin Rx for breast cancer (initiated 12/2014); b. 03/2015 MUGA EF 64%; b. 08/2015 MUGA: EF 51%; c. 10/2015 MUGA: EF 44%; d. 11/2015 Echo: EF 45-50%; e. 01/2016 MUGA: EF 60%; f. 06/2016 MUGA EF 65%; g. 10/2016 MUGA: EF 61%;  h. 12/2016 Echo: EF 55-60%, gr1 DD.  Marland Kitchen Hyperlipidemia   . Hypertension   . Neuropathy   . Possible PAD (peripheral artery disease) ([place])    a. 11/2016 LE cyanosis and weak pulses-->CTA w/o significant Ao-BiFem dzs. ? distal dzs-->ASA/Plavix initiated by vascular surgery.  Marland Kitchen PSVT (paroxysmal supraventricular tachycardia) ([place])    a. Dx 11/2016.  . Pulmonary embolism ([place])    a. 12/2016 CTA Chest: small nonocclusive PE in inferior segment of the Left lingula,  somewhat eccentric filling defect suggesting chronic rather than acute embolic event; b. 05/1693 LE U/S:  No DVT; c. 12/2016 Echo: Nl RV fxn, nl PASP.  Marland Kitchen Recurrent Metastatic breast cancer ([place])    a. Dx 2016: Stage II, ER positive, PR positive, HER-2/neu overexpressing of the left breast-->chemo/radiation; b. 10/2016 CT Abd/pelvis: diffuse liver mets, ill defined sclerotic bone lesions-T12;  c. 10/2016 MRI brain: metastatic lesion along L temporal lobe (19x69m) w/ extensive surrounding edema & 524mmidline shift to right.  . Sinus tachycardia     Past Surgical History:  Procedure Laterality Date  . BREAST BIOPSY Left 2016   Positive  . BREAST LUMPECTOMY WITH SENTINEL LYMPH NODE BIOPSY Left 05/23/2015   Procedure: LEFT BREAST WIDE EXCISION WITH AXILLARY DISSECTION, MASTOPLASTY ;  Surgeon: JeRobert BellowMD;  Location: [facility] ORS;  Service: General;  Laterality: Left;  . BREAST SURGERY Left 12/18/14   breast biopsy/INVASIVE DUCTAL CARCINOMA OF BREAST, NOTTINGHAM GRADE 2.  . Marland KitchenREAST SURGERY  05/23/2015.   Wide excision/mastoplasty, axillary dissection. No residual invasive cancer, positive for residual DCIS. 0/2 nodes identified on axillary dissection. (no SLN by technetium or methylene blue)  . CARDIAC CATHETERIZATION    . COLONOSCOPY WITH PROPOFOL N/A 12/10/2016   Procedure: COLONOSCOPY WITH PROPOFOL;  Surgeon: DaLucilla LameMD;  Location: [facility] ENDOSCOPY;  Service: Endoscopy;  Laterality: N/A;  . COLONOSCOPY WITH PROPOFOL N/A 02/13/2017   Procedure: COLONOSCOPY WITH PROPOFOL;  Surgeon: Lucilla Lame, MD;  Location: [facility] ENDOSCOPY;  Service: Endoscopy;  Laterality: N/A;  . ESOPHAGOGASTRODUODENOSCOPY (EGD) WITH PROPOFOL N/A 12/08/2016   Procedure: ESOPHAGOGASTRODUODENOSCOPY (EGD) WITH PROPOFOL;  Surgeon: Lucilla Lame, MD;  Location: [facility] ENDOSCOPY;  Service: Endoscopy;  Laterality: N/A;  . IVC FILTER INSERTION N/A 02/15/2017   Procedure: IVC Filter Insertion;  Surgeon: Algernon Huxley, MD;  Location: [place] CV LAB;  Service: Cardiovascular;  Laterality: N/A;  . PORTACATH PLACEMENT Right 12-31-14   Dr Bary Castilla    There were no vitals filed for this visit.      Subjective Assessment - 05/13/17 1303    Subjective Doing fine. Walking is better.    Pertinent History Bilateral UE and LE weakness. Pt states that she feels like her weakness came from her neuropathy. Difficulty writing. Both hands feel numb currently but can feel her hand when she touches it. Has had the weakness for about 3 months (since April 2018) which began gradually.  Went through chemo and radiation last year for about 2.5 months until August 2017.  Still has cancer in her L breast which also traveled to her brain and liver.   Pt also lost 10 lbs since she was diagnosed with cancer 2 years ago.  Apetite is fine. Normally eats breakfast at times. Normally eats one good meal during the day and eats junk in between such as cookies and sodas.  Eats a frozen dinner during lunch. Does not know if she gets enough protein.   Only has pain in her feet and hands bilaterally.  No neck or back pain.   Has been using a rw since May 2018.  Was independent with gait before May 2018.  Pt goal is to improve her function.    Patient Stated Goals I want to be able to get out of the walker and not use a cane. Be able to do all the things she used to do such as cooking a meal, laundry,    Currently in Pain? Other (Comment)   Pain Score --  hands and feet, no number provided                                 PT Education - 05/13/17 1318    Education provided Yes   Education Details ther-ex   [employer]) Educated Patient   Methods Explanation;Demonstration;Tactile cues;Verbal cues   Comprehension Returned demonstration;Verbalized understanding        Objectives   There-ex  Blood pressure R arm sitting, mechanically taken: 116/77, HR 107 SpO2 100% room Air  Pt observed to ambulate into clinic with her [facility]     Forward step up onto and off 3 inch step with contralateral SPC assist 10x each LE             To promote ability negotiate curbs. CGA    stepping onto and off actual curb using SPC, CGA to SBA 5x each LE  Able to perform without LOB.  Seated knee extension resisting 3 lbs weight 10x3 each LE.  Side stepping resisting yellow band around ankles with bilateral UE assist at treadmill bars 5x each direction (5 ft each)    Sit <> stand from regular chair and 1 pillow, bilateral UE assist to stand, no UE to sit, emphasis on femoral control and not pushing back against the chair with her knees. 5x3  Standing heel toe raises with bilateral UE assist 5x3 each  way   Sitting on dyna-disc to promote core muscle activation             Low rows resisting red band 10x5 seconds for 2 sets   bilateral scapular retraction resisting red band 10x   Improved exercise technique, movement at target joints, use of target muscles after min to mod verbal, visual, tactile cues.    Better able to step up onto and off a curb and a 3 in step using her SPC, and better able to sit down without UE assist onto a chair with 1 pillow and with improved femoral control today compared to previous visits. Pt tolerated session well without aggravation of symptoms.            PT Long Term Goals - 04/20/17 1957      PT LONG TERM GOAL #1   Title Patient will be able to perform her TUG time using a rw to 18 seconds or less to promote functional mobility.   Baseline 25.3 seconds average with rw (04/20/2017)   Time 6   Period Weeks   Status New   Target Date 06/03/17     PT LONG TERM GOAL #2   Title Patient will improve bilateral UE and bilateral hip, and knee strength to promote ability to perform functional tasks at home.    Time 6   Period Weeks   Status New   Target Date 06/03/17     PT LONG TERM GOAL #3   Title Patient will improve her LEFS score by at least 9 points as a demonstration of  improved function.    Baseline 25/80 (04/20/2017)   Time 6   Period Weeks   Status New   Target Date 06/03/17     PT LONG TERM GOAL #4   Title Patient will improve her Quick Dash Disability/Symptom score by at least 15% as a demonstration of improved function.    Baseline 70.45% (04/20/2017)   Time 6   Period Weeks   Status New   Target Date 06/03/17               Plan - 05/13/17 1257    Clinical Impression Statement Better able to step up onto and off a curb and a 3 in step using her SPC, and better able to sit down without UE assist onto a chair with 1 pillow and with improved femoral control today compared to previous visits. Pt tolerated session well without aggravation of symptoms.   History and Personal Factors relevant to plan of care: hx of CA, weakness, multiple health problems   Clinical Presentation Stable   Clinical Presentation due to: improved ability to negotiate curb with [facility]   Clinical Decision Making Low   Rehab Potential Fair   Clinical Impairments Affecting Rehab Potential hx of CA, multiple health problems   PT Frequency 2x / week   PT Duration 6 weeks   PT Treatment/Interventions Manual techniques;Patient/family education;Therapeutic activities;Therapeutic exercise;Gait training;Neuromuscular re-education;Balance training   PT Next Visit Plan hip, scapular, LE, UE strengthening, gait, balance, femoral control   Consulted and Agree with Plan of Care Patient      Patient will benefit from skilled therapeutic intervention in order to improve the following deficits and impairments:  Pain, Improper body mechanics, Postural dysfunction, Impaired UE functional use, Decreased strength, Difficulty walking, Decreased endurance  Visit Diagnosis: Difficulty in walking, not elsewhere classified  Muscle weakness (generalized)     Problem List Patient Active Problem List   Diagnosis  Date Noted  . Blood in stool   . Polyp of sigmoid colon   . Benign neoplasm  of transverse colon   . Benign neoplasm of ascending colon   . Lower GI bleed   . Weakness of right lower extremity 02/11/2017  . Metastatic breast cancer ([place]) 01/18/2017  . Orthostatic lightheadedness 01/14/2017  . Atrial tachycardia ([place]) 01/14/2017  . Hypokalemia 01/14/2017  . Pulmonary emboli ([place]) 01/12/2017  . Dehydration 12/25/2016  . Blood loss anemia 12/15/2016  . Ulceration of colon   . Heartburn   . Duodenitis   . Gastritis without bleeding   . GI bleed   . Acute esophagitis   . Acute esophagogastric ulcer   . Rectal bleed   . Hypovolemic shock ([place])   . Ischemic leg 12/04/2016  . Chemotherapy induced neutropenia ([place]) 11/20/2016  . Palliative care by specialist   . Goals of care, counseling/discussion   . DNR (do not resuscitate) discussion   . Cerebral edema ([place]) 11/10/2016  . Encounter for monitoring cardiotoxic drug therapy 11/09/2016  . Elevated LFTs 10/20/2016  . URI with cough and congestion 08/25/2016  . Peripheral neuropathy due to chemotherapy ([place]) 03/13/2016  . Encounter for antineoplastic chemotherapy 03/13/2016  . HLD (hyperlipidemia) 01/29/2016  . Hyperglycemia, unspecified 01/29/2016  . Cardiomyopathy ([place]) 01/13/2016  . Hypertension 07/18/2015  . Carcinoma of upper-outer quadrant of left breast in female, estrogen receptor positive ([place]) 12/27/2014    Joneen Boers PT, DPT   05/13/2017, 1:51 PM  [place] [facility] [address], [place], [address] Phone: [phone]   Fax:  [phone]  Name: Sarah Horne MRN: [id number] Date of Birth: [birth year]

## 2017-05-14 NOTE — Telephone Encounter (Signed)
Patient informed per VO Dr B not to start the Verzenio at this time and to keep her appointment next week

## 2017-05-18 ENCOUNTER — Ambulatory Visit: Payer: BLUE CROSS/BLUE SHIELD

## 2017-05-18 ENCOUNTER — Ambulatory Visit: Payer: BLUE CROSS/BLUE SHIELD | Admitting: Occupational Therapy

## 2017-05-18 DIAGNOSIS — M6281 Muscle weakness (generalized): Secondary | ICD-10-CM

## 2017-05-18 DIAGNOSIS — M79641 Pain in right hand: Secondary | ICD-10-CM

## 2017-05-18 DIAGNOSIS — G620 Drug-induced polyneuropathy: Secondary | ICD-10-CM | POA: Diagnosis not present

## 2017-05-18 DIAGNOSIS — R262 Difficulty in walking, not elsewhere classified: Secondary | ICD-10-CM | POA: Diagnosis not present

## 2017-05-18 DIAGNOSIS — M79642 Pain in left hand: Secondary | ICD-10-CM

## 2017-05-18 DIAGNOSIS — D701 Agranulocytosis secondary to cancer chemotherapy: Secondary | ICD-10-CM

## 2017-05-18 DIAGNOSIS — T451X5A Adverse effect of antineoplastic and immunosuppressive drugs, initial encounter: Secondary | ICD-10-CM | POA: Diagnosis not present

## 2017-05-18 NOTE — Therapy (Signed)
[place] [facility] [address], [place], [address] Phone: [phone]   Fax:  [phone]  Occupational Therapy Treatment  Patient Details  Name: Sarah Horne MRN: [id number] Date of Birth: September 16, 1956 Referring Provider: Rogue Bussing  Encounter Date: 05/18/2017      OT End of Session - 05/18/17 1428    Visit Number 3   Number of Visits 12   Date for OT Re-Evaluation 06/14/17   OT Start Time 1345   OT Stop Time 1420   OT Time Calculation (min) 35 min   Activity Tolerance Patient tolerated treatment well   Behavior During Therapy [facility] for tasks assessed/performed      Past Medical History:  Diagnosis Date  . Chemotherapy-induced peripheral neuropathy ([place])   . Epistaxis    a. 11/2016 in setting of asa/plavix-->silver nitrate cauterization.  . GI bleed    a. 11/2016 Admission w/ GIB and hypovolemic shock req 3u PRBC's;  b. 11/2016 ECG: gastritis & nonbleeding peptic ulcer; c. 11/2016 Conlonoscopy: rectal and sigmoid colonic ulcers.  . Heart attack ([place])    a. 1998 Cath @ [facility]: reportedly no intervention required.  Marland Kitchen Herceptin-induced cardiomyopathy ([place])    a. In the setting of Herceptin Rx for breast cancer (initiated 12/2014); b. 03/2015 MUGA EF 64%; b. 08/2015 MUGA: EF 51%; c. 10/2015 MUGA: EF 44%; d. 11/2015 Echo: EF 45-50%; e. 01/2016 MUGA: EF 60%; f. 06/2016 MUGA EF 65%; g. 10/2016 MUGA: EF 61%;  h. 12/2016 Echo: EF 55-60%, gr1 DD.  Marland Kitchen Hyperlipidemia   . Hypertension   . Neuropathy   . Possible PAD (peripheral artery disease) ([place])    a. 11/2016 LE cyanosis and weak pulses-->CTA w/o significant Ao-BiFem dzs. ? distal dzs-->ASA/Plavix initiated by vascular surgery.  Marland Kitchen PSVT (paroxysmal supraventricular tachycardia) ([place])    a. Dx 11/2016.  . Pulmonary embolism ([place])    a. 12/2016 CTA Chest: small nonocclusive PE in inferior segment of the Left lingula, somewhat eccentric filling defect suggesting chronic rather than  acute embolic event; b. 04/2422 LE U/S:  No DVT; c. 12/2016 Echo: Nl RV fxn, nl PASP.  Marland Kitchen Recurrent Metastatic breast cancer ([place])    a. Dx 2016: Stage II, ER positive, PR positive, HER-2/neu overexpressing of the left breast-->chemo/radiation; b. 10/2016 CT Abd/pelvis: diffuse liver mets, ill defined sclerotic bone lesions-T12;  c. 10/2016 MRI brain: metastatic lesion along L temporal lobe (19x55m) w/ extensive surrounding edema & 554mmidline shift to right.  . Sinus tachycardia     Past Surgical History:  Procedure Laterality Date  . BREAST BIOPSY Left 2016   Positive  . BREAST LUMPECTOMY WITH SENTINEL LYMPH NODE BIOPSY Left 05/23/2015   Procedure: LEFT BREAST WIDE EXCISION WITH AXILLARY DISSECTION, MASTOPLASTY ;  Surgeon: JeRobert BellowMD;  Location: [facility] ORS;  Service: General;  Laterality: Left;  . BREAST SURGERY Left 12/18/14   breast biopsy/INVASIVE DUCTAL CARCINOMA OF BREAST, NOTTINGHAM GRADE 2.  . Marland KitchenREAST SURGERY  05/23/2015.   Wide excision/mastoplasty, axillary dissection. No residual invasive cancer, positive for residual DCIS. 0/2 nodes identified on axillary dissection. (no SLN by technetium or methylene blue)  . CARDIAC CATHETERIZATION    . COLONOSCOPY WITH PROPOFOL N/A 12/10/2016   Procedure: COLONOSCOPY WITH PROPOFOL;  Surgeon: DaLucilla LameMD;  Location: [facility] ENDOSCOPY;  Service: Endoscopy;  Laterality: N/A;  . COLONOSCOPY WITH PROPOFOL N/A 02/13/2017   Procedure: COLONOSCOPY WITH PROPOFOL;  Surgeon: WoLucilla LameMD;  [facility];  Service: Endoscopy;  Laterality:  N/A;  . ESOPHAGOGASTRODUODENOSCOPY (EGD) WITH PROPOFOL N/A 12/08/2016   Procedure: ESOPHAGOGASTRODUODENOSCOPY (EGD) WITH PROPOFOL;  Surgeon: Lucilla Lame, MD;  Location: [facility] ENDOSCOPY;  Service: Endoscopy;  Laterality: N/A;  . IVC FILTER INSERTION N/A 02/15/2017   Procedure: IVC Filter Insertion;  Surgeon: Algernon Huxley, MD;  Location: [place] CV LAB;  Service: Cardiovascular;  Laterality: N/A;  .  PORTACATH PLACEMENT Right 12-31-14   Dr Bary Castilla    There were no vitals filed for this visit.      Subjective Assessment - 05/18/17 1427    Subjective  I don't know let me see if my grip increased again - neuropathy feels about the same    Patient Stated Goals Patient reports she would like to be able to write and do all the things she did before.             [facility] OT Assessment - 05/18/17 0001      Strength   Right Hand Grip (lbs) 26   Right Hand Lateral Pinch 6 lbs   Right Hand 3 Point Pinch 5 lbs   Left Hand Grip (lbs) 28   Left Hand Lateral Pinch 6 lbs       assess grip and prehension strength   and preforming HEP assess with green putty Show Froments test with lat grip  Positive Tinel for cubital tunnel bilateral  And ABD of 5th digits    Review for correct tech for   lat and 3 point grip - to keeping in cylinder to provide enough resistance  And pinch into middle of putty   need min A    Discuss with pt neuropathy and need to compensate with vision and AE Discuss with pt positioning of elbow during sleeping or sitting  And what to avoid  And add and review Ulnar N glide and Medial N glides  5 reps  2 x day                     OT Education - 05/18/17 1428    Education provided Yes   Education Details ulnar and medial N glides    Person(s) Educated Patient   Methods Explanation;Demonstration;Tactile cues;Verbal cues;Handout   Comprehension Verbal cues required;Returned demonstration             OT Long Term Goals - 05/09/17 2113      OT LONG TERM GOAL #1   Title Patient will demonstrate home exercise program with modified independence.    Baseline none   Time 3   Period Weeks   Status New   Target Date 05/24/17     OT LONG TERM GOAL #2   Title Patient will increase grip strength in bilateral hands by 10# to assist with holding objects and opening containers.    Baseline R 8#, L 16#   Time 6   Period Weeks   Status New    Target Date 06/14/17     OT LONG TERM GOAL #3   Title Patient will complete peeling and cutting vegetables with modified independence using adaptive equipment as needed.    Baseline moderate assist   Time 6   Period Weeks   Status New   Target Date 06/14/17     OT LONG TERM GOAL #4   Title Patient will improve pinch by 2# to assist with pulling button out on washer with modified independence.    Baseline limited pinch BUE   Time 6   Period Weeks  Status New   Target Date 06/14/17     OT LONG TERM GOAL #5   Title Patient will report pain in bilateral hands 2 or less to perform daily tasks.    Baseline 4 at eval   Time 6   Period Weeks   Status New   Target Date 06/14/17               Plan - 05/18/17 1429    Clinical Impression Statement Pt show with chemoneurophaty this date positive Froments and Tinel at cubital tunnel - pt report sleeping with elbow bended and propping it always up - provided Ulnar and MEd N glides - pt show increase grip and rprhension strength again in R and L hand weekly since start - cont with HEP  and upgrade HEP as needed    Occupational performance deficits (Please refer to evaluation for details): ADL's;IADL's;Leisure   Rehab Potential Good   Current Impairments/barriers affecting progress: Age, comorbidities, history of cancer with chemo tx and neuropathy.     OT Frequency 1x / week   OT Duration 4 weeks   OT Treatment/Interventions Self-care/ADL training;Moist Heat;Fluidtherapy;DME and/or AE instruction;Patient/family education;Splinting;Contrast Bath;Therapeutic exercises;Ultrasound;Therapeutic exercise;Therapeutic activities;Neuromuscular education;Parrafin;Energy conservation;Manual Therapy   Plan reassess progress and change HEP as needed    OT Home Exercise Plan Ulnar and Med  Nglides added - cont with putty    Consulted and Agree with Plan of Care Patient      Patient will benefit from skilled therapeutic intervention in order to  improve the following deficits and impairments:  Impaired UE functional use, Pain, Decreased strength  Visit Diagnosis: Muscle weakness (generalized)  Chemotherapy induced neutropenia (HCC)  Pain in right hand  Pain in left hand    Problem List Patient Active Problem List   Diagnosis Date Noted  . Blood in stool   . Polyp of sigmoid colon   . Benign neoplasm of transverse colon   . Benign neoplasm of ascending colon   . Lower GI bleed   . Weakness of right lower extremity 02/11/2017  . Metastatic breast cancer ([place]) 01/18/2017  . Orthostatic lightheadedness 01/14/2017  . Atrial tachycardia ([place]) 01/14/2017  . Hypokalemia 01/14/2017  . Pulmonary emboli ([place]) 01/12/2017  . Dehydration 12/25/2016  . Blood loss anemia 12/15/2016  . Ulceration of colon   . Heartburn   . Duodenitis   . Gastritis without bleeding   . GI bleed   . Acute esophagitis   . Acute esophagogastric ulcer   . Rectal bleed   . Hypovolemic shock ([place])   . Ischemic leg 12/04/2016  . Chemotherapy induced neutropenia ([place]) 11/20/2016  . Palliative care by specialist   . Goals of care, counseling/discussion   . DNR (do not resuscitate) discussion   . Cerebral edema ([place]) 11/10/2016  . Encounter for monitoring cardiotoxic drug therapy 11/09/2016  . Elevated LFTs 10/20/2016  . URI with cough and congestion 08/25/2016  . Peripheral neuropathy due to chemotherapy ([place]) 03/13/2016  . Encounter for antineoplastic chemotherapy 03/13/2016  . HLD (hyperlipidemia) 01/29/2016  . Hyperglycemia, unspecified 01/29/2016  . Cardiomyopathy ([place]) 01/13/2016  . Hypertension 07/18/2015  . Carcinoma of upper-outer quadrant of left breast in female, estrogen receptor positive ([place]) 12/27/2014    Rosalyn Gess OTR/L,CLT 05/18/2017, 2:31 PM  [place] [facility] [address], [place], [address] Phone: [phone]   Fax:  [phone]  Name: LATISSA FRICK MRN: [id number] Date of Birth: 26-Aug-1956

## 2017-05-18 NOTE — Therapy (Signed)
[facility] [address] [place], [address] Phone: [phone]   Fax:  [phone]  Physical Therapy Treatment  Patient Details  Name: Sarah Horne MRN: [id number] Date of Birth: 10-Dec-1955 Referring Provider: Charlaine Dalton, MD  Encounter Date: 05/18/2017      PT End of Session - 05/18/17 1301    Visit Number 9   Number of Visits 13   Date for PT Re-Evaluation 06/03/17   PT Start Time 0626   PT Stop Time 1346   PT Time Calculation (min) 44 min   Equipment Utilized During Treatment Gait belt  SPC   Activity Tolerance Patient tolerated treatment well   Behavior During Therapy [facility] for tasks assessed/performed      Past Medical History:  Diagnosis Date  . Chemotherapy-induced peripheral neuropathy ([place])   . Epistaxis    a. 11/2016 in setting of asa/plavix-->silver nitrate cauterization.  . GI bleed    a. 11/2016 Admission w/ GIB and hypovolemic shock req 3u PRBC's;  b. 11/2016 ECG: gastritis & nonbleeding peptic ulcer; c. 11/2016 Conlonoscopy: rectal and sigmoid colonic ulcers.  . Heart attack ([place])    a. 1998 Cath @ [facility]: reportedly no intervention required.  Marland Kitchen Herceptin-induced cardiomyopathy ([place])    a. In the setting of Herceptin Rx for breast cancer (initiated 12/2014); b. 03/2015 MUGA EF 64%; b. 08/2015 MUGA: EF 51%; c. 10/2015 MUGA: EF 44%; d. 11/2015 Echo: EF 45-50%; e. 01/2016 MUGA: EF 60%; f. 06/2016 MUGA EF 65%; g. 10/2016 MUGA: EF 61%;  h. 12/2016 Echo: EF 55-60%, gr1 DD.  Marland Kitchen Hyperlipidemia   . Hypertension   . Neuropathy   . Possible PAD (peripheral artery disease) ([place])    a. 11/2016 LE cyanosis and weak pulses-->CTA w/o significant Ao-BiFem dzs. ? distal dzs-->ASA/Plavix initiated by vascular surgery.  Marland Kitchen PSVT (paroxysmal supraventricular tachycardia) ([place])    a. Dx 11/2016.  . Pulmonary embolism ([place])    a. 12/2016 CTA Chest: small nonocclusive PE in inferior segment of the Left lingula,  somewhat eccentric filling defect suggesting chronic rather than acute embolic event; b. 05/4853 LE U/S:  No DVT; c. 12/2016 Echo: Nl RV fxn, nl PASP.  Marland Kitchen Recurrent Metastatic breast cancer ([place])    a. Dx 2016: Stage II, ER positive, PR positive, HER-2/neu overexpressing of the left breast-->chemo/radiation; b. 10/2016 CT Abd/pelvis: diffuse liver mets, ill defined sclerotic bone lesions-T12;  c. 10/2016 MRI brain: metastatic lesion along L temporal lobe (19x78m) w/ extensive surrounding edema & 540mmidline shift to right.  . Sinus tachycardia     Past Surgical History:  Procedure Laterality Date  . BREAST BIOPSY Left 2016   Positive  . BREAST LUMPECTOMY WITH SENTINEL LYMPH NODE BIOPSY Left 05/23/2015   Procedure: LEFT BREAST WIDE EXCISION WITH AXILLARY DISSECTION, MASTOPLASTY ;  Surgeon: JeRobert BellowMD;  Location: [facility] ORS;  Service: General;  Laterality: Left;  . BREAST SURGERY Left 12/18/14   breast biopsy/INVASIVE DUCTAL CARCINOMA OF BREAST, NOTTINGHAM GRADE 2.  . Marland KitchenREAST SURGERY  05/23/2015.   Wide excision/mastoplasty, axillary dissection. No residual invasive cancer, positive for residual DCIS. 0/2 nodes identified on axillary dissection. (no SLN by technetium or methylene blue)  . CARDIAC CATHETERIZATION    . COLONOSCOPY WITH PROPOFOL N/A 12/10/2016   Procedure: COLONOSCOPY WITH PROPOFOL;  Surgeon: DaLucilla LameMD;  Location: [facility] ENDOSCOPY;  Service: Endoscopy;  Laterality: N/A;  . COLONOSCOPY WITH PROPOFOL N/A 02/13/2017   Procedure: COLONOSCOPY WITH PROPOFOL;  Surgeon:  Lucilla Lame, MD;  Location: [facility];  Service: Endoscopy;  Laterality: N/A;  . ESOPHAGOGASTRODUODENOSCOPY (EGD) WITH PROPOFOL N/A 12/08/2016   Procedure: ESOPHAGOGASTRODUODENOSCOPY (EGD) WITH PROPOFOL;  Surgeon: Lucilla Lame, MD;  Location: [facility] ENDOSCOPY;  Service: Endoscopy;  Laterality: N/A;  . IVC FILTER INSERTION N/A 02/15/2017   Procedure: IVC Filter Insertion;  Surgeon: Algernon Huxley, MD;  Location: [facility];  Service: Cardiovascular;  Laterality: N/A;  . PORTACATH PLACEMENT Right 12-31-14   Dr Bary Castilla    There were no vitals filed for this visit.      Subjective Assessment - 05/18/17 1305    Subjective Was able to go up and down 3 flights of stair with one rail and SPC assist this weekend at [facility] and had no problem. No pain other than her hands and feet.    Pertinent History Bilateral UE and LE weakness. Pt states that she feels like her weakness came from her neuropathy. Difficulty writing. Both hands feel numb currently but can feel her hand when she touches it. Has had the weakness for about 3 months (since April 2018) which began gradually.  Went through chemo and radiation last year for about 2.5 months until August 2017.  Still has cancer in her L breast which also traveled to her brain and liver.   Pt also lost 10 lbs since she was diagnosed with cancer 2 years ago.  Apetite is fine. Normally eats breakfast at times. Normally eats one good meal during the day and eats junk in between such as cookies and sodas.  Eats a frozen dinner during lunch. Does not know if she gets enough protein.   Only has pain in her feet and hands bilaterally.  No neck or back pain.   Has been using a rw since May 2018.  Was independent with gait before May 2018.  Pt goal is to improve her function.    Patient Stated Goals I want to be able to get out of the walker and not use a cane. Be able to do all the things she used to do such as cooking a meal, laundry,    Currently in Pain? Other (Comment)  bilateral hands and feet.    Pain Score --  no pain level provided                                 PT Education - 05/18/17 1322    Education provided Yes   Education Details ther-ex   [employer]) Educated Patient   Methods Explanation;Demonstration;Tactile cues;Verbal cues   Comprehension Returned demonstration;Verbalized understanding         Objectives  Pt  observed to ambulate into clinic with her SPC   There-ex  Blood pressure R arm sitting, mechanically taken: 111/75, HR 110 SpO2 98% room Air    Gait without AD 250 ft, and throughout session CGA. No LOB  Side stepping 32 ft to the R and 32 ft to the L without AD for 2 sets CGA to promote glute med muscle strengthening.   Standing ankle gastroc stretch on rocker board 2 min with 5 second stretches  Four square step exercise for balance 5x2 each direction  Standing low rows resisting red band 10x5 seconds for 2 sets  Standing hip abduction resisting yellow band with bilateral UE assist 10x3  Omega rows plate 10 for 58I3  Standing alteranating toe taps onto treadmill platform with light  touch to no UE assist 10x2 each LE to promote balance    Improved exercise technique, movement at target joints, use of target muscles after min to  mod verbal, visual, tactile cues.    Able to ambulate at least 250 ft without AD and no LOB. Able to ascend and descend 3 flights of stairs as well per pt subjective reports suggesting improved function. Improving LE strength and activity tolerance. Continued working on UE and LE strengthening and balance to promote ability to perform functional tasks at home.             PT Long Term Goals - 04/20/17 1957      PT LONG TERM GOAL #1   Title Patient will be able to perform her TUG time using a rw to 18 seconds or less to promote functional mobility.   Baseline 25.3 seconds average with rw (04/20/2017)   Time 6   Period Weeks   Status New   Target Date 06/03/17     PT LONG TERM GOAL #2   Title Patient will improve bilateral UE and bilateral hip, and knee strength to promote ability to perform functional tasks at home.    Time 6   Period Weeks   Status New   Target Date 06/03/17     PT LONG TERM GOAL #3   Title Patient will improve her LEFS score by at least 9 points as a demonstration of improved function.    Baseline 25/80  (04/20/2017)   Time 6   Period Weeks   Status New   Target Date 06/03/17     PT LONG TERM GOAL #4   Title Patient will improve her Quick Dash Disability/Symptom score by at least 15% as a demonstration of improved function.    Baseline 70.45% (04/20/2017)   Time 6   Period Weeks   Status New   Target Date 06/03/17               Plan - 05/18/17 2042    Clinical Impression Statement Able to ambulate at least 250 ft without AD and no LOB. Able to ascend and descend 3 flights of stairs as well per pt subjective reports suggesting improved function. Improving LE strength and activity tolerance. Continued working on UE and LE strengthening and balance to promote ability to perform functional tasks at home.    History and Personal Factors relevant to plan of care: hx of CA, weakness, multiple health problems   Clinical Presentation Stable   Clinical Presentation due to: able to negotiate 3 flights of stairs with one rail and one SPC assist this weekend, able to ambulate without AD an no LOB today   Clinical Decision Making Low   Rehab Potential Fair   Clinical Impairments Affecting Rehab Potential hx of CA, multiple health problems   PT Frequency 2x / week   PT Duration 6 weeks   PT Treatment/Interventions Manual techniques;Patient/family education;Therapeutic activities;Therapeutic exercise;Gait training;Neuromuscular re-education;Balance training   PT Next Visit Plan hip, scapular, LE, UE strengthening, gait, balance, femoral control   Consulted and Agree with Plan of Care Patient      Patient will benefit from skilled therapeutic intervention in order to improve the following deficits and impairments:  Pain, Improper body mechanics, Postural dysfunction, Impaired UE functional use, Decreased strength, Difficulty walking, Decreased endurance  Visit Diagnosis: Difficulty in walking, not elsewhere classified  Muscle weakness (generalized)     Problem List Patient Active  Problem List   Diagnosis Date Noted  .  Blood in stool   . Polyp of sigmoid colon   . Benign neoplasm of transverse colon   . Benign neoplasm of ascending colon   . Lower GI bleed   . Weakness of right lower extremity 02/11/2017  . Metastatic breast cancer ([place]) 01/18/2017  . Orthostatic lightheadedness 01/14/2017  . Atrial tachycardia ([place]) 01/14/2017  . Hypokalemia 01/14/2017  . Pulmonary emboli ([place]) 01/12/2017  . Dehydration 12/25/2016  . Blood loss anemia 12/15/2016  . Ulceration of colon   . Heartburn   . Duodenitis   . Gastritis without bleeding   . GI bleed   . Acute esophagitis   . Acute esophagogastric ulcer   . Rectal bleed   . Hypovolemic shock ([place])   . Ischemic leg 12/04/2016  . Chemotherapy induced neutropenia ([place]) 11/20/2016  . Palliative care by specialist   . Goals of care, counseling/discussion   . DNR (do not resuscitate) discussion   . Cerebral edema ([place]) 11/10/2016  . Encounter for monitoring cardiotoxic drug therapy 11/09/2016  . Elevated LFTs 10/20/2016  . URI with cough and congestion 08/25/2016  . Peripheral neuropathy due to chemotherapy ([place]) 03/13/2016  . Encounter for antineoplastic chemotherapy 03/13/2016  . HLD (hyperlipidemia) 01/29/2016  . Hyperglycemia, unspecified 01/29/2016  . Cardiomyopathy ([place]) 01/13/2016  . Hypertension 07/18/2015  . Carcinoma of upper-outer quadrant of left breast in female, estrogen receptor positive ([place]) 12/27/2014    Joneen Boers PT, DPT   05/18/2017, 8:49 PM  [facility] [address], [place], [address] Phone: [phone]   Fax:  [phone]  Name: Sarah Horne MRN: [id number] Date of Birth: 09-25-1956

## 2017-05-19 ENCOUNTER — Inpatient Hospital Stay: Payer: BLUE CROSS/BLUE SHIELD

## 2017-05-19 ENCOUNTER — Inpatient Hospital Stay (HOSPITAL_BASED_OUTPATIENT_CLINIC_OR_DEPARTMENT_OTHER): Payer: BLUE CROSS/BLUE SHIELD | Admitting: Internal Medicine

## 2017-05-19 VITALS — BP 106/71 | HR 109 | Temp 97.1°F | Resp 16 | Wt 137.8 lb

## 2017-05-19 DIAGNOSIS — Z9221 Personal history of antineoplastic chemotherapy: Secondary | ICD-10-CM

## 2017-05-19 DIAGNOSIS — Z170 Estrogen receptor positive status [ER+]: Secondary | ICD-10-CM

## 2017-05-19 DIAGNOSIS — F17210 Nicotine dependence, cigarettes, uncomplicated: Secondary | ICD-10-CM | POA: Diagnosis not present

## 2017-05-19 DIAGNOSIS — G629 Polyneuropathy, unspecified: Secondary | ICD-10-CM | POA: Diagnosis not present

## 2017-05-19 DIAGNOSIS — Z95828 Presence of other vascular implants and grafts: Secondary | ICD-10-CM

## 2017-05-19 DIAGNOSIS — K7290 Hepatic failure, unspecified without coma: Secondary | ICD-10-CM | POA: Diagnosis not present

## 2017-05-19 DIAGNOSIS — I10 Essential (primary) hypertension: Secondary | ICD-10-CM | POA: Diagnosis not present

## 2017-05-19 DIAGNOSIS — C50412 Malignant neoplasm of upper-outer quadrant of left female breast: Secondary | ICD-10-CM

## 2017-05-19 DIAGNOSIS — I471 Supraventricular tachycardia: Secondary | ICD-10-CM

## 2017-05-19 DIAGNOSIS — Z86711 Personal history of pulmonary embolism: Secondary | ICD-10-CM | POA: Diagnosis not present

## 2017-05-19 DIAGNOSIS — C7931 Secondary malignant neoplasm of brain: Secondary | ICD-10-CM | POA: Diagnosis not present

## 2017-05-19 DIAGNOSIS — E875 Hyperkalemia: Secondary | ICD-10-CM

## 2017-05-19 DIAGNOSIS — Z7689 Persons encountering health services in other specified circumstances: Secondary | ICD-10-CM | POA: Diagnosis not present

## 2017-05-19 DIAGNOSIS — E860 Dehydration: Secondary | ICD-10-CM

## 2017-05-19 DIAGNOSIS — E876 Hypokalemia: Secondary | ICD-10-CM | POA: Diagnosis not present

## 2017-05-19 DIAGNOSIS — Z79818 Long term (current) use of other agents affecting estrogen receptors and estrogen levels: Secondary | ICD-10-CM | POA: Diagnosis not present

## 2017-05-19 DIAGNOSIS — M8580 Other specified disorders of bone density and structure, unspecified site: Secondary | ICD-10-CM | POA: Diagnosis not present

## 2017-05-19 DIAGNOSIS — E785 Hyperlipidemia, unspecified: Secondary | ICD-10-CM | POA: Diagnosis not present

## 2017-05-19 DIAGNOSIS — Z79899 Other long term (current) drug therapy: Secondary | ICD-10-CM | POA: Diagnosis not present

## 2017-05-19 DIAGNOSIS — Z923 Personal history of irradiation: Secondary | ICD-10-CM | POA: Diagnosis not present

## 2017-05-19 LAB — CBC WITH DIFFERENTIAL/PLATELET
BASOS PCT: 1 %
Basophils Absolute: 0 10*3/uL (ref 0–0.1)
EOS ABS: 0.2 10*3/uL (ref 0–0.7)
Eosinophils Relative: 8 %
HEMATOCRIT: 31.8 % — AB (ref 35.0–47.0)
HEMOGLOBIN: 10.6 g/dL — AB (ref 12.0–16.0)
LYMPHS ABS: 0.5 10*3/uL — AB (ref 1.0–3.6)
Lymphocytes Relative: 20 %
MCH: 30.6 pg (ref 26.0–34.0)
MCHC: 33.5 g/dL (ref 32.0–36.0)
MCV: 91.6 fL (ref 80.0–100.0)
Monocytes Absolute: 0.3 10*3/uL (ref 0.2–0.9)
Monocytes Relative: 11 %
NEUTROS ABS: 1.4 10*3/uL (ref 1.4–6.5)
NEUTROS PCT: 60 %
Platelets: 164 10*3/uL (ref 150–440)
RBC: 3.47 MIL/uL — AB (ref 3.80–5.20)
RDW: 22.8 % — ABNORMAL HIGH (ref 11.5–14.5)
WBC: 2.3 10*3/uL — AB (ref 3.6–11.0)

## 2017-05-19 LAB — COMPREHENSIVE METABOLIC PANEL
ALT: 32 U/L (ref 14–54)
ANION GAP: 7 (ref 5–15)
AST: 63 U/L — ABNORMAL HIGH (ref 15–41)
Albumin: 3 g/dL — ABNORMAL LOW (ref 3.5–5.0)
Alkaline Phosphatase: 190 U/L — ABNORMAL HIGH (ref 38–126)
BUN: 16 mg/dL (ref 6–20)
CALCIUM: 8.9 mg/dL (ref 8.9–10.3)
CHLORIDE: 103 mmol/L (ref 101–111)
CO2: 26 mmol/L (ref 22–32)
CREATININE: 0.6 mg/dL (ref 0.44–1.00)
Glucose, Bld: 98 mg/dL (ref 65–99)
Potassium: 4 mmol/L (ref 3.5–5.1)
SODIUM: 136 mmol/L (ref 135–145)
Total Bilirubin: 1 mg/dL (ref 0.3–1.2)
Total Protein: 7.6 g/dL (ref 6.5–8.1)

## 2017-05-19 MED ORDER — SODIUM CHLORIDE 0.9% FLUSH
10.0000 mL | INTRAVENOUS | Status: DC | PRN
  Administered 2017-05-19: 10 mL via INTRAVENOUS
  Filled 2017-05-19: qty 10

## 2017-05-19 MED ORDER — HEPARIN SOD (PORK) LOCK FLUSH 100 UNIT/ML IV SOLN
500.0000 [IU] | Freq: Once | INTRAVENOUS | Status: AC
  Administered 2017-05-19: 500 [IU] via INTRAVENOUS

## 2017-05-19 MED ORDER — FULVESTRANT 250 MG/5ML IM SOLN
500.0000 mg | Freq: Once | INTRAMUSCULAR | Status: AC
  Administered 2017-05-19: 500 mg via INTRAMUSCULAR
  Filled 2017-05-19: qty 10

## 2017-05-19 NOTE — Progress Notes (Signed)
[place] OFFICE PROGRESS NOTE  Patient Care Team: [name], MD as PCP - General (Family Medicine) Bary Castilla, Forest Gleason, MD (General Surgery) [name], MD (Inactive) as Attending Physician (Internal Medicine)  No matching staging information was found for the patient.   Oncology History   # LEFT BREAST IDC; STAGE II [cT2N1] ER >90%; PR- 50-90%; her 2 NEU- POS; s/p Neoadj chemo; AUG 2016- TCH+P s/p Lumpec & partial ALND- path CR; s/p RT [finished Nov 2016];  adj Herceptin; HELD for Jan 19th 2017 [in DEC 2016-EF dropped from 63 to 51%; FEB 27th EF-42.9%]; JAN 2016 START Arimidex; May 2017- EF- 60%; May 24th 2017-Re-start Herceptin q 3W; July 28th STOP herceptin [finished 59m/m2; sec to Low EF; however 2017 Oct EF= 67%; improved]  # DEC 4th 2017- Start Neratinib 4 pills; DEC 11th 3 pills; STOPPED.  # FEB 2018- RECURRENCE ER/PR positive; Her 2 NEGATIVE [liver Bx]  # MARCH 1st 2018- Tax-Cytoxan  # FEB 2018- Brain mets [SBRT; Dr.Crystal; finished Feb 28th 2018]  # March 2018- Eribulin  # PN- G-2; may 2017- Cymbalta 689md;MARCH 2018-  acute vascular insuff of BIL LE [? Taxotere vasoconstriction on asprin]; # hemorragic shock LGIB [s/p colo; Dr.Wohl]    # April 2016- Liver Bx- NEG;   # Drop in EF from Herceptin [recovered OCt 27th- EF 65%]  # BMD- jan 2017- osteopenia     Carcinoma of upper-outer quadrant of left breast in female, estrogen receptor positive ([facility]  12/27/2014 Initial Diagnosis    [place] cancer of upper[place]outer quadrant of [place] breast [place]       INTERVAL HISTORY:  A very pleasant 89ear old female patient with above history of Recurrent stage IV ER/PR positive HER-2/neu NEGATIVE [status post liver biopsy February 2018] - is currently on Faslodex-Abemaciclib [ambema on hold for 8 weeks] is here for follow-up/Patient is currently off Abema x 8 Weeks because of severe neutropenia.   Patient continues to improve overall. Her  appetite is improving. She is gaining weight. She is more up and about at home. She is currently walking with a cane.  No falls. No headaches. She is working with physical therapy. No fever no chills. Patient has not had any recent hospitalizations. She continues to chronic tingling and numbness in the feet and hands. Not any worse. No fevers or chills.  REVIEW OF SYSTEMS:  A complete 10 point review of system is done which is negative except mentioned above/history of present illness.   PAST MEDICAL HISTORY :  Past Medical History:  Diagnosis Date  . Chemotherapy-induced peripheral neuropathy ([facility]  . Epistaxis    a. 11/2016 in setting of asa/plavix-->silver nitrate cauterization.  . GI bleed    a. 11/2016 Admission w/ GIB and hypovolemic shock req 3u PRBC's;  b. 11/2016 ECG: gastritis & nonbleeding peptic ulcer; c. 11/2016 Conlonoscopy: rectal and sigmoid colonic ulcers.  . Heart attack ([facility]   a. 1998 Cath @ [facility]: reportedly no intervention required.  . Marland Kitchenerceptin-induced cardiomyopathy ([facility]   a. In the setting of Herceptin Rx for breast cancer (initiated 12/2014); b. 03/2015 MUGA EF 64%; b. 08/2015 MUGA: EF 51%; c. 10/2015 MUGA: EF 44%; d. 11/2015 Echo: EF 45-50%; e. 01/2016 MUGA: EF 60%; f. 06/2016 MUGA EF 65%; g. 10/2016 MUGA: EF 61%;  h. 12/2016 Echo: EF 55-60%, gr1 DD.  . Marland Kitchenyperlipidemia   . Hypertension   . Neuropathy   . Possible PAD (peripheral artery disease) ([facility]  a. 11/2016 LE cyanosis and weak pulses-->CTA w/o significant Ao-BiFem dzs. ? distal dzs-->ASA/Plavix initiated by vascular surgery.  Marland Kitchen PSVT (paroxysmal supraventricular tachycardia) ([place])    a. Dx 11/2016.  . Pulmonary embolism ([place])    a. 12/2016 CTA Chest: small nonocclusive PE in inferior segment of the Left lingula, somewhat eccentric filling defect suggesting chronic rather than acute embolic event; b. 09/6107 LE U/S:  No DVT; c. 12/2016 Echo: Nl RV fxn, nl PASP.  Marland Kitchen Recurrent Metastatic breast cancer ([place])    a. Dx 2016:  Stage II, ER positive, PR positive, HER-2/neu overexpressing of the left breast-->chemo/radiation; b. 10/2016 CT Abd/pelvis: diffuse liver mets, ill defined sclerotic bone lesions-T12;  c. 10/2016 MRI brain: metastatic lesion along L temporal lobe (19x75m) w/ extensive surrounding edema & 571mmidline shift to right.  . Sinus tachycardia     PAST SURGICAL HISTORY :   Past Surgical History:  Procedure Laterality Date  . BREAST BIOPSY Left 2016   Positive  . BREAST LUMPECTOMY WITH SENTINEL LYMPH NODE BIOPSY Left 05/23/2015   Procedure: LEFT BREAST WIDE EXCISION WITH AXILLARY DISSECTION, MASTOPLASTY ;  Surgeon: JeRobert BellowMD;  Location: [facility] ORS;  Service: General;  Laterality: Left;  . BREAST SURGERY Left 12/18/14   breast biopsy/INVASIVE DUCTAL CARCINOMA OF BREAST, NOTTINGHAM GRADE 2.  . Marland KitchenREAST SURGERY  05/23/2015.   Wide excision/mastoplasty, axillary dissection. No residual invasive cancer, positive for residual DCIS. 0/2 nodes identified on axillary dissection. (no SLN by technetium or methylene blue)  . CARDIAC CATHETERIZATION    . COLONOSCOPY WITH PROPOFOL N/A 12/10/2016   Procedure: COLONOSCOPY WITH PROPOFOL;  Surgeon: DaLucilla LameMD;  Location: [facility] ENDOSCOPY;  Service: Endoscopy;  Laterality: N/A;  . COLONOSCOPY WITH PROPOFOL N/A 02/13/2017   Procedure: COLONOSCOPY WITH PROPOFOL;  Surgeon: WoLucilla LameMD;  [facility];  Service: Endoscopy;  Laterality: N/A;  . ESOPHAGOGASTRODUODENOSCOPY (EGD) WITH PROPOFOL N/A 12/08/2016   Procedure: ESOPHAGOGASTRODUODENOSCOPY (EGD) WITH PROPOFOL;  Surgeon: DaLucilla LameMD;  Location: [facility] ENDOSCOPY;  Service: Endoscopy;  Laterality: N/A;  . IVC FILTER INSERTION N/A 02/15/2017   Procedure: IVC Filter Insertion;  Surgeon: DeAlgernon HuxleyMD;  [facility];  Service: Cardiovascular;  Laterality: N/A;  . PORTACATH PLACEMENT Right 12-31-14   Dr ByBary Castilla  FAMILY HISTORY :   Family History  Problem Relation Age of Onset   . Breast cancer Maternal Aunt   . Breast cancer Cousin   . Brain cancer Maternal Uncle   . Diabetes Mother   . Hypertension Mother   . Stroke Mother     SOCIAL HISTORY:   Social History  Substance Use Topics  . Smoking status: Light Tobacco Smoker    Packs/day: 0.50    Years: 18.00    Types: Cigarettes  . Smokeless tobacco: Never Used  . Alcohol use No    ALLERGIES:  is allergic to no known allergies.  MEDICATIONS:  Current Outpatient Prescriptions  Medication Sig Dispense Refill  . Abemaciclib 50 MG TABS Take 1 tablet by mouth daily. DO NOT START Until seen by MD. 30 tablet 3  . anastrozole (ARIMIDEX) 1 MG tablet Take 1 tablet (1 mg total) by mouth daily. 90 tablet 3  . feeding supplement, ENSURE ENLIVE, (ENSURE ENLIVE) LIQD Take 237 mLs by mouth 2 (two) times daily between meals. 237 mL 12  . flecainide (TAMBOCOR) 50 MG tablet Take 1 tablet (50 mg total) by mouth 2 (two) times daily. 60 tablet 1  . magnesium  oxide (MAG-OX) 400 MG tablet Take 1 tablet (400 mg total) by mouth 2 (two) times daily. 60 tablet 6  . nystatin (MYCOSTATIN) 100000 UNIT/ML suspension Take 5 mLs (500,000 Units total) by mouth 4 (four) times daily. 60 mL 0  . oxyCODONE (OXY IR/ROXICODONE) 5 MG immediate release tablet Take 1 tablet (5 mg total) by mouth every 8 (eight) hours as needed for severe pain. 42 tablet 0  . potassium chloride SA (K-DUR,KLOR-CON) 20 MEQ tablet Take 1 tablet (20 mEq total) by mouth daily. 90 tablet 3  . pregabalin (LYRICA) 75 MG capsule Take 1 capsule (75 mg total) by mouth 2 (two) times daily. 60 capsule 2  . diphenoxylate-atropine (LOMOTIL) 2.5-0.025 MG tablet Take 1 tablet by mouth 4 (four) times daily as needed for diarrhea or loose stools. Take it along with immodium (Patient not taking: Reported on 05/19/2017) 60 tablet 0  . ondansetron (ZOFRAN) 8 MG tablet Take 1 tablet (8 mg total) by mouth every 8 (eight) hours as needed for nausea or vomiting (start 3 days; after chemo).  (Patient not taking: Reported on 05/19/2017) 40 tablet 1   No current facility-administered medications for this visit.    Facility-Administered Medications Ordered in Other Visits  Medication Dose Route Frequency Provider Last Rate Last Dose  . 0.9 %  sodium chloride infusion   Intravenous Continuous Pandit, Sandeep, MD      . 0.9 %  sodium chloride infusion   Intravenous Continuous Lloyd Huger, MD 999 mL/hr at 04/24/15 1520    . heparin lock flush 100 unit/mL  500 Units Intravenous Once Berenzon, Dmitriy, MD      . sodium chloride 0.9 % injection 10 mL  10 mL Intravenous PRN [name], MD   10 mL at 04/04/15 1440  . sodium chloride flush (NS) 0.9 % injection 10 mL  10 mL Intravenous PRN Berenzon, Dmitriy, MD      . Tbo-Filgrastim (GRANIX) injection 480 mcg  480 mcg Subcutaneous Once Cammie Sickle, MD        PHYSICAL EXAMINATION: ECOG PERFORMANCE STATUS: 0 - Asymptomatic  BP 106/71 (BP Location: Right Arm, Patient Position: Sitting)   Pulse (!) 109   Temp (!) 97.1 F (36.2 C) (Tympanic)   Resp 16   Wt 137 lb 12.8 oz (62.5 kg)   BMI 22.24 kg/m   Filed Weights   05/19/17 1111  Weight: 137 lb 12.8 oz (62.5 kg)    GENERAL: Well-nourished well-developed; Alert, no distress and comfortable. Accompanied by her brother.  She is walking with a cane. EYES: no pallor. No jaundice. She is in a wheelchair  OROPHARYNX: No  ulceration; good dentition ; NECK: supple, no masses felt LYMPH:  no palpable lymphadenopathy in the cervical, axillary or inguinal regions LUNGS: clear to auscultation and  No wheeze or crackles HEART/CVS: regular rate & rhythm and no murmurs; NO bilateral lower extremity edema.  ABDOMEN:abdomen soft, non-tender and normal bowel sounds Musculoskeletal:no cyanosis of digits and no clubbing  PSYCH: alert & oriented x 3 with fluent speech NEURO: no focal motor/sensory deficits- except for foot drop on the right side.   LABORATORY DATA:  I have  reviewed the data as listed    Component Value Date/Time   NA 136 05/19/2017 1054   NA 135 03/04/2017 0918   NA 135 01/08/2015 0850   K 4.0 05/19/2017 1054   K 3.7 01/08/2015 0850   CL 103 05/19/2017 1054   CL 101 01/08/2015 0850   CO2 26  05/19/2017 1054   CO2 26 01/08/2015 0850   GLUCOSE 98 05/19/2017 1054   GLUCOSE 161 (H) 01/08/2015 0850   BUN 16 05/19/2017 1054   BUN 8 03/04/2017 0918   BUN 17 01/08/2015 0850   CREATININE 0.60 05/19/2017 1054   CREATININE 0.82 01/22/2015 1559   CALCIUM 8.9 05/19/2017 1054   CALCIUM 9.3 01/08/2015 0850   PROT 7.6 05/19/2017 1054   PROT 6.4 03/04/2017 0918   PROT 6.8 01/22/2015 1559   ALBUMIN 3.0 (L) 05/19/2017 1054   ALBUMIN 2.5 (L) 03/04/2017 0918   ALBUMIN 3.9 01/22/2015 1559   AST 63 (H) 05/19/2017 1054   AST 34 01/22/2015 1559   ALT 32 05/19/2017 1054   ALT 42 01/22/2015 1559   ALKPHOS 190 (H) 05/19/2017 1054   ALKPHOS 157 (H) 01/22/2015 1559   BILITOT 1.0 05/19/2017 1054   BILITOT 6.2 (H) 03/04/2017 0918   BILITOT 0.2 (L) 01/22/2015 1559   GFRNONAA >60 05/19/2017 1054   GFRNONAA >60 01/22/2015 1559   GFRAA >60 05/19/2017 1054   GFRAA >60 01/22/2015 1559    No results found for: SPEP, UPEP  Lab Results  Component Value Date   WBC 2.3 (L) 05/19/2017   NEUTROABS 1.4 05/19/2017   HGB 10.6 (L) 05/19/2017   HCT 31.8 (L) 05/19/2017   MCV 91.6 05/19/2017   PLT 164 05/19/2017      Chemistry      Component Value Date/Time   NA 136 05/19/2017 1054   NA 135 03/04/2017 0918   NA 135 01/08/2015 0850   K 4.0 05/19/2017 1054   K 3.7 01/08/2015 0850   CL 103 05/19/2017 1054   CL 101 01/08/2015 0850   CO2 26 05/19/2017 1054   CO2 26 01/08/2015 0850   BUN 16 05/19/2017 1054   BUN 8 03/04/2017 0918   BUN 17 01/08/2015 0850   CREATININE 0.60 05/19/2017 1054   CREATININE 0.82 01/22/2015 1559      Component Value Date/Time   CALCIUM 8.9 05/19/2017 1054   CALCIUM 9.3 01/08/2015 0850   ALKPHOS 190 (H) 05/19/2017 1054    ALKPHOS 157 (H) 01/22/2015 1559   AST 63 (H) 05/19/2017 1054   AST 34 01/22/2015 1559   ALT 32 05/19/2017 1054   ALT 42 01/22/2015 1559   BILITOT 1.0 05/19/2017 1054   BILITOT 6.2 (H) 03/04/2017 0918   BILITOT 0.2 (L) 01/22/2015 1559            LABORATORY DATA:  I have reviewed the data as listed    Component Value Date/Time   NA 136 05/19/2017 1054   NA 135 03/04/2017 0918   NA 135 01/08/2015 0850   K 4.0 05/19/2017 1054   K 3.7 01/08/2015 0850   CL 103 05/19/2017 1054   CL 101 01/08/2015 0850   CO2 26 05/19/2017 1054   CO2 26 01/08/2015 0850   GLUCOSE 98 05/19/2017 1054   GLUCOSE 161 (H) 01/08/2015 0850   BUN 16 05/19/2017 1054   BUN 8 03/04/2017 0918   BUN 17 01/08/2015 0850   CREATININE 0.60 05/19/2017 1054   CREATININE 0.82 01/22/2015 1559   CALCIUM 8.9 05/19/2017 1054   CALCIUM 9.3 01/08/2015 0850   PROT 7.6 05/19/2017 1054   PROT 6.4 03/04/2017 0918   PROT 6.8 01/22/2015 1559   ALBUMIN 3.0 (L) 05/19/2017 1054   ALBUMIN 2.5 (L) 03/04/2017 0918   ALBUMIN 3.9 01/22/2015 1559   AST 63 (H) 05/19/2017 1054   AST 34 01/22/2015 1559  ALT 32 05/19/2017 1054   ALT 42 01/22/2015 1559   ALKPHOS 190 (H) 05/19/2017 1054   ALKPHOS 157 (H) 01/22/2015 1559   BILITOT 1.0 05/19/2017 1054   BILITOT 6.2 (H) 03/04/2017 0918   BILITOT 0.2 (L) 01/22/2015 1559   GFRNONAA >60 05/19/2017 1054   GFRNONAA >60 01/22/2015 1559   GFRAA >60 05/19/2017 1054   GFRAA >60 01/22/2015 1559    No results found for: SPEP, UPEP  Lab Results  Component Value Date   WBC 2.3 (L) 05/19/2017   NEUTROABS 1.4 05/19/2017   HGB 10.6 (L) 05/19/2017   HCT 31.8 (L) 05/19/2017   MCV 91.6 05/19/2017   PLT 164 05/19/2017      Chemistry      Component Value Date/Time   NA 136 05/19/2017 1054   NA 135 03/04/2017 0918   NA 135 01/08/2015 0850   K 4.0 05/19/2017 1054   K 3.7 01/08/2015 0850   CL 103 05/19/2017 1054   CL 101 01/08/2015 0850   CO2 26 05/19/2017 1054   CO2 26 01/08/2015  0850   BUN 16 05/19/2017 1054   BUN 8 03/04/2017 0918   BUN 17 01/08/2015 0850   CREATININE 0.60 05/19/2017 1054   CREATININE 0.82 01/22/2015 1559      Component Value Date/Time   CALCIUM 8.9 05/19/2017 1054   CALCIUM 9.3 01/08/2015 0850   ALKPHOS 190 (H) 05/19/2017 1054   ALKPHOS 157 (H) 01/22/2015 1559   AST 63 (H) 05/19/2017 1054   AST 34 01/22/2015 1559   ALT 32 05/19/2017 1054   ALT 42 01/22/2015 1559   BILITOT 1.0 05/19/2017 1054   BILITOT 6.2 (H) 03/04/2017 0918   BILITOT 0.2 (L) 01/22/2015 1559     IMPRESSION: Normal left ventricular wall motion with estimated ejection fraction of 65%.   Electronically Signed   By: Marijo Sanes M.D.   On: 07/27/2016 09:04  RADIOGRAPHIC STUDIES: I have personally reviewed the radiological images as listed and agreed with the findings in the report. No results found.   ASSESSMENT & PLAN:  Carcinoma of upper-outer quadrant of left breast in female, estrogen receptor positive ([place]) # RECURRENT Metastatic breast cancer- ER/PR positive HER-2/neu negative [status post liver biopsy February 2018; previously HER-2/neu positive]. Currently on palliative Faslodex+ Abemaciclib [on hold sec to neutropenia].   # Clinically improving;AUG 6th 2018- CT scan shows improvement of the liver lesions; tumor markers improving. Today white count is 2.3 ANC 1.5; Hb-10; platelets Normal.   # I would recommend restarting Abema- 50 mg once a day one week repeat CBC on 829. Likely Granix for 3 days.  # Liver failure- sec to progressive malignancy- clinical improvement noted. Not needing any ascites.  # Brain mets- s/p RT [finished Feb 28th]; symptomatic improvement. Repeat MRI before the next visit.  # PN G-3 secondary to chemotherapy. Continue Lyrica; oxycodone once a day.  # Hypokalemia- continue K supp. improved.   # lab on aug 29th; posisble granix 29  thru 31st.   # Follow-up in [place] on sep 4th/labs.  Reviewed the lab work progress with the  patient and her brother in detail.   Orders Placed This Encounter  Procedures  . MR Brain W Wo Contrast    Standing Status:   Future    Standing Expiration Date:   05/19/2018    Order Specific Question:   If indicated for the ordered procedure, I authorize the administration of contrast media per Radiology protocol    Answer:  Yes    Order Specific Question:   What is the patient's sedation requirement?    Answer:   No Sedation    Order Specific Question:   Does the patient have a pacemaker or implanted devices?    Answer:   No    Order Specific Question:   Radiology Contrast Protocol - do NOT remove file path    Answer:   [email or web address]    Order Specific Question:   Reason for Exam additional comments    Answer:   brain mets    Order Specific Question:   Preferred imaging location?    Answer:   [facility] (table limit-350lbs)  . CBC with Differential/Platelet    Standing Status:   Future    Standing Expiration Date:   05/19/2018  . Cancer antigen 27.29    Standing Status:   Future    Standing Expiration Date:   05/19/2018   All questions were answered. The patient knows to call the clinic with any problems, questions or concerns.      Cammie Sickle, MD 05/19/2017 1:36 PM

## 2017-05-19 NOTE — Assessment & Plan Note (Addendum)
#  RECURRENT Metastatic breast cancer- ER/PR positive HER-2/neu negative [status post liver biopsy February 2018; previously HER-2/neu positive]. Currently on palliative Faslodex+ Abemaciclib [on hold sec to neutropenia].   # Clinically improving;AUG 6th 2018- CT scan shows improvement of the liver lesions; tumor markers improving. Today white count is 2.3 ANC 1.5; Hb-10; platelets Normal.   # I would recommend restarting Abema- 50 mg once a day one week repeat CBC on 829. Likely Granix for 3 days.  # Liver failure- sec to progressive malignancy- clinical improvement noted. Not needing any ascites.  # Brain mets- s/p RT [finished Feb 28th]; symptomatic improvement. Repeat MRI before the next visit.  # PN G-3 secondary to chemotherapy. Continue Lyrica; oxycodone once a day.  # Hypokalemia- continue K supp. improved.   # lab on aug 29th; posisble granix 29  thru 31st.   # Follow-up in [place] on sep 4th/labs.  Reviewed the lab work progress with the patient and her brother in detail.

## 2017-05-19 NOTE — Patient Instructions (Signed)
Start taking Abema/verizinio 50 mg once a day; start today. Take it until aug 28th ; get labs on 8/29; ?? Booster injection x3 days.

## 2017-05-20 ENCOUNTER — Ambulatory Visit: Payer: BLUE CROSS/BLUE SHIELD

## 2017-05-20 ENCOUNTER — Ambulatory Visit: Payer: BLUE CROSS/BLUE SHIELD | Admitting: Occupational Therapy

## 2017-05-20 DIAGNOSIS — M79641 Pain in right hand: Secondary | ICD-10-CM | POA: Diagnosis not present

## 2017-05-20 DIAGNOSIS — T451X5A Adverse effect of antineoplastic and immunosuppressive drugs, initial encounter: Secondary | ICD-10-CM | POA: Diagnosis not present

## 2017-05-20 DIAGNOSIS — R262 Difficulty in walking, not elsewhere classified: Secondary | ICD-10-CM | POA: Diagnosis not present

## 2017-05-20 DIAGNOSIS — G620 Drug-induced polyneuropathy: Secondary | ICD-10-CM | POA: Diagnosis not present

## 2017-05-20 DIAGNOSIS — M79642 Pain in left hand: Secondary | ICD-10-CM | POA: Diagnosis not present

## 2017-05-20 DIAGNOSIS — M6281 Muscle weakness (generalized): Secondary | ICD-10-CM | POA: Diagnosis not present

## 2017-05-20 NOTE — Therapy (Signed)
[name] [address] [place], [address] Phone: [phone]   Fax:  [phone]  Physical Therapy Treatment  Patient Details  Name: Sarah Horne MRN: [id number] Date of Birth: 27-Oct-1955 Referring Provider: Charlaine Dalton, MD  Encounter Date: 05/20/2017      PT End of Session - 05/20/17 1301    Visit Number 10   Number of Visits 13   Date for PT Re-Evaluation 06/03/17   PT Start Time 1301   PT Stop Time 1346   PT Time Calculation (min) 45 min   Equipment Utilized During Treatment Gait belt  SPC   Activity Tolerance Patient tolerated treatment well   Behavior During Therapy [facility] for tasks assessed/performed      Past Medical History:  Diagnosis Date  . Chemotherapy-induced peripheral neuropathy ([place])   . Epistaxis    a. 11/2016 in setting of asa/plavix-->silver nitrate cauterization.  . GI bleed    a. 11/2016 Admission w/ GIB and hypovolemic shock req 3u PRBC's;  b. 11/2016 ECG: gastritis & nonbleeding peptic ulcer; c. 11/2016 Conlonoscopy: rectal and sigmoid colonic ulcers.  . Heart attack ([place])    a. 1998 Cath @ [facility]: reportedly no intervention required.  Marland Kitchen Herceptin-induced cardiomyopathy ([place])    a. In the setting of Herceptin Rx for breast cancer (initiated 12/2014); b. 03/2015 MUGA EF 64%; b. 08/2015 MUGA: EF 51%; c. 10/2015 MUGA: EF 44%; d. 11/2015 Echo: EF 45-50%; e. 01/2016 MUGA: EF 60%; f. 06/2016 MUGA EF 65%; g. 10/2016 MUGA: EF 61%;  h. 12/2016 Echo: EF 55-60%, gr1 DD.  Marland Kitchen Hyperlipidemia   . Hypertension   . Neuropathy   . Possible PAD (peripheral artery disease) ([place])    a. 11/2016 LE cyanosis and weak pulses-->CTA w/o significant Ao-BiFem dzs. ? distal dzs-->ASA/Plavix initiated by vascular surgery.  Marland Kitchen PSVT (paroxysmal supraventricular tachycardia) ([place])    a. Dx 11/2016.  . Pulmonary embolism ([place])    a. 12/2016 CTA Chest: small nonocclusive PE in inferior segment of the Left lingula,  somewhat eccentric filling defect suggesting chronic rather than acute embolic event; b. 11/7104 LE U/S:  No DVT; c. 12/2016 Echo: Nl RV fxn, nl PASP.  Marland Kitchen Recurrent Metastatic breast cancer ([place])    a. Dx 2016: Stage II, ER positive, PR positive, HER-2/neu overexpressing of the left breast-->chemo/radiation; b. 10/2016 CT Abd/pelvis: diffuse liver mets, ill defined sclerotic bone lesions-T12;  c. 10/2016 MRI brain: metastatic lesion along L temporal lobe (19x33m) w/ extensive surrounding edema & 556mmidline shift to right.  . Sinus tachycardia     Past Surgical History:  Procedure Laterality Date  . BREAST BIOPSY Left 2016   Positive  . BREAST LUMPECTOMY WITH SENTINEL LYMPH NODE BIOPSY Left 05/23/2015   Procedure: LEFT BREAST WIDE EXCISION WITH AXILLARY DISSECTION, MASTOPLASTY ;  Surgeon: JeRobert BellowMD;  Location: [facility] ORS;  Service: General;  Laterality: Left;  . BREAST SURGERY Left 12/18/14   breast biopsy/INVASIVE DUCTAL CARCINOMA OF BREAST, NOTTINGHAM GRADE 2.  . Marland KitchenREAST SURGERY  05/23/2015.   Wide excision/mastoplasty, axillary dissection. No residual invasive cancer, positive for residual DCIS. 0/2 nodes identified on axillary dissection. (no SLN by technetium or methylene blue)  . CARDIAC CATHETERIZATION    . COLONOSCOPY WITH PROPOFOL N/A 12/10/2016   Procedure: COLONOSCOPY WITH PROPOFOL;  Surgeon: DaLucilla LameMD;  Location: [facility] ENDOSCOPY;  Service: Endoscopy;  Laterality: N/A;  . COLONOSCOPY WITH PROPOFOL N/A 02/13/2017   Procedure: COLONOSCOPY WITH PROPOFOL;  Surgeon:  Lucilla Lame, MD;  Location: [facility];  Service: Endoscopy;  Laterality: N/A;  . ESOPHAGOGASTRODUODENOSCOPY (EGD) WITH PROPOFOL N/A 12/08/2016   Procedure: ESOPHAGOGASTRODUODENOSCOPY (EGD) WITH PROPOFOL;  Surgeon: Lucilla Lame, MD;  Location: [facility] ENDOSCOPY;  Service: Endoscopy;  Laterality: N/A;  . IVC FILTER INSERTION N/A 02/15/2017   Procedure: IVC Filter Insertion;  Surgeon: Algernon Huxley, MD;  Location: [facility];  Service: Cardiovascular;  Laterality: N/A;  . PORTACATH PLACEMENT Right 12-31-14   Dr Bary Castilla    There were no vitals filed for this visit.      Subjective Assessment - 05/20/17 1303    Subjective No pain or discomfort other than her hands and feet.    Pertinent History Bilateral UE and LE weakness. Pt states that she feels like her weakness came from her neuropathy. Difficulty writing. Both hands feel numb currently but can feel her hand when she touches it. Has had the weakness for about 3 months (since April 2018) which began gradually.  Went through chemo and radiation last year for about 2.5 months until August 2017.  Still has cancer in her L breast which also traveled to her brain and liver.   Pt also lost 10 lbs since she was diagnosed with cancer 2 years ago.  Apetite is fine. Normally eats breakfast at times. Normally eats one good meal during the day and eats junk in between such as cookies and sodas.  Eats a frozen dinner during lunch. Does not know if she gets enough protein.   Only has pain in her feet and hands bilaterally.  No neck or back pain.   Has been using a rw since May 2018.  Was independent with gait before May 2018.  Pt goal is to improve her function.    Patient Stated Goals I want to be able to get out of the walker and not use a cane. Be able to do all the things she used to do such as cooking a meal, laundry,    Currently in Pain? Other (Comment)  no pain other than on her hands and feet   Pain Score --            [facility] PT Assessment - 05/20/17 1306      Observation/Other Assessments   Lower Extremity Functional Scale  57/80   Quick DASH  25%     Strength   Right Shoulder Flexion 4/5   Right Shoulder ABduction 4+/5   Right Shoulder Internal Rotation 4+/5   Right Shoulder External Rotation 4/5   Left Shoulder Flexion 4/5   Left Shoulder ABduction 4+/5   Left Shoulder Internal Rotation 4+/5   Left Shoulder External Rotation 4/5   Right  Elbow Flexion 4/5   Right Elbow Extension 4/5   Left Elbow Flexion 4/5   Left Elbow Extension 4+/5   Right Hip Flexion 4/5   Right Hip ABduction 4/5   Left Hip Flexion 4/5   Left Hip ABduction 4/5   Right Knee Flexion 4/5   Right Knee Extension 4+/5   Left Knee Flexion 4/5   Left Knee Extension 4+/5   Right Ankle Dorsiflexion 2-/5   Right Ankle Plantar Flexion 4+/5  manual resistance   Left Ankle Dorsiflexion 4/5   Left Ankle Plantar Flexion 4+/5  manual resistance                             PT Education - 05/20/17 1915  Education provided Yes   Education Details ther-ex, progress/current status with PT   Person(s) Educated Patient   Methods Explanation;Demonstration;Tactile cues;Verbal cues   Comprehension Returned demonstration;Verbalized understanding        Objectives  Pt observed to ambulate into clinic with her SPC   There-ex  Blood pressure R arm sitting, mechanically taken: 107/73, HR 105 SpO2 97% room Air    Manually resisted shoulder flexion, abduction, IR, ER, elbow flexion, elbow extension, seated hip flexion, knee flexion, knee extension, ankle DF, PF, S/L hip abduction 1x each way for each UE and LE  Reviewed progress/current status of UE and LE strength with pt.   Standing up from a chair, walking 10 ft forward, then returning 10 ft, then sitting back onto chair 3x with SPC  13 seconds, 12 seconds, 11 seconds. 12 seconds on average with SPC   Gait without AD 400 ft, and throughout session CGA. No LOB   Standing alteranating toe taps onto treadmill platform with light touch to no UE assist 10x, then no UE 10x each LE to promote balance  Able to perform without LOB. CGA   SLS with light touch assist 5x5 seconds each LE. Able to perform 1-2 x 5 seconds on R LE without UE assist.  Sit <> stand from a 22.5 inch surface without UE assist 5x  Then from a regular chair, bilateral UE to stand, no UE assist to sit. Improving  femoral control and ability to control descent onto lower surface without use of UE assist.   Improved exercise technique, movement at target joints, use of target muscles after min to mod verbal, visual, tactile cues.   Pt demonstrates overall improved bilateral UE and LE strength, TUG time with least restrictive AD, ability to ambulate, as well as improved function since initial evaluation. Pt making very good progress with PT towards goals.              PT Long Term Goals - 05/20/17 1328      PT LONG TERM GOAL #1   Title Patient will be able to perform her TUG time using a rw to 18 seconds or less to promote functional mobility.   Baseline 25.3 seconds average with rw (04/20/2017); 12 seconds on average with SPC (05/20/2017)   Time 6   Period Weeks   Status Achieved     PT LONG TERM GOAL #2   Title Patient will improve bilateral UE and bilateral hip, and knee strength to promote ability to perform functional tasks at home.    Time 4   Period Weeks   Status On-going   Target Date 06/17/17     PT LONG TERM GOAL #3   Title Patient will improve her LEFS score by at least 9 points as a demonstration of improved function.    Baseline 25/80 (04/20/2017); 57/80 (05/20/2017)   Time 6   Period Weeks   Status Achieved     PT LONG TERM GOAL #4   Title Patient will improve her Quick Dash Disability/Symptom score by at least 15% as a demonstration of improved function.    Baseline 70.45% (04/20/2017); 25% (05/20/2017)   Time 6   Period Weeks   Status Achieved     PT LONG TERM GOAL #5   Title Patient will be able to perform her TUG time without AD to 12 seconds or less to promote functional mobility.   Baseline 12 seconds with SPC (05/20/2017)   Time 4   Period Weeks  Status New   Target Date 06/17/17     Additional Long Term Goals   Additional Long Term Goals Yes     PT LONG TERM GOAL #6   Title Pt will be able to ambulate at least 6 minutes without AD with SBA and no LOB to  promote mobility and function.    Baseline able to ambulate 400 ft without AD with CGA, no LOB (05/20/2017)   Time 4   Period Weeks   Status New   Target Date 06/17/17               Plan - 05/20/17 1256    Clinical Impression Statement Pt demonstrates overall improved bilateral UE and LE strength, TUG time with least restrictive AD, ability to ambulate, as well as improved function since initial evaluation. Pt making very good progress with PT towards goals.     History and Personal Factors relevant to plan of care: hx of CA, weakness, multiple health problems   Clinical Presentation Stable   Clinical Presentation due to: Pt making very good progress with PT towards goals.    Clinical Decision Making Low   Rehab Potential Fair   Clinical Impairments Affecting Rehab Potential hx of CA, multiple health problems   PT Frequency 2x / week   PT Duration 6 weeks   PT Treatment/Interventions Manual techniques;Patient/family education;Therapeutic activities;Therapeutic exercise;Gait training;Neuromuscular re-education;Balance training   PT Next Visit Plan hip, scapular, LE, UE strengthening, gait, balance, femoral control   Consulted and Agree with Plan of Care Patient      Patient will benefit from skilled therapeutic intervention in order to improve the following deficits and impairments:  Pain, Improper body mechanics, Postural dysfunction, Impaired UE functional use, Decreased strength, Difficulty walking, Decreased endurance  Visit Diagnosis: Muscle weakness (generalized)  Difficulty in walking, not elsewhere classified     Problem List Patient Active Problem List   Diagnosis Date Noted  . Brain metastasis ([place]) 05/19/2017  . Blood in stool   . Polyp of sigmoid colon   . Benign neoplasm of transverse colon   . Benign neoplasm of ascending colon   . Lower GI bleed   . Weakness of right lower extremity 02/11/2017  . Metastatic breast cancer ([place]) 01/18/2017  .  Orthostatic lightheadedness 01/14/2017  . Atrial tachycardia ([name]) 01/14/2017  . Hypokalemia 01/14/2017  . Pulmonary emboli ([place]) 01/12/2017  . Dehydration 12/25/2016  . Blood loss anemia 12/15/2016  . Ulceration of colon   . Heartburn   . Duodenitis   . Gastritis without bleeding   . GI bleed   . Acute esophagitis   . Acute esophagogastric ulcer   . Rectal bleed   . Hypovolemic shock ([place])   . Ischemic leg 12/04/2016  . Chemotherapy induced neutropenia ([place]) 11/20/2016  . Palliative care by specialist   . Goals of care, counseling/discussion   . DNR (do not resuscitate) discussion   . Cerebral edema ([place]) 11/10/2016  . Encounter for monitoring cardiotoxic drug therapy 11/09/2016  . Elevated LFTs 10/20/2016  . URI with cough and congestion 08/25/2016  . Peripheral neuropathy due to chemotherapy ([place]) 03/13/2016  . Encounter for antineoplastic chemotherapy 03/13/2016  . HLD (hyperlipidemia) 01/29/2016  . Hyperglycemia, unspecified 01/29/2016  . Cardiomyopathy ([place]) 01/13/2016  . Hypertension 07/18/2015  . Carcinoma of upper-outer quadrant of left breast in female, estrogen receptor positive ([place]) 12/27/2014    Joneen Boers PT, DPT   05/20/2017, 7:33 PM  [facility] [address] [place], [address] Phone: [phone]   Fax:  [phone]  Name: Sarah Horne MRN: [id number] Date of Birth: 12/25/1955

## 2017-05-21 ENCOUNTER — Other Ambulatory Visit: Payer: Self-pay | Admitting: Cardiovascular Disease

## 2017-05-25 ENCOUNTER — Ambulatory Visit: Payer: BLUE CROSS/BLUE SHIELD | Admitting: Occupational Therapy

## 2017-05-25 ENCOUNTER — Ambulatory Visit: Payer: BLUE CROSS/BLUE SHIELD

## 2017-05-25 DIAGNOSIS — R262 Difficulty in walking, not elsewhere classified: Secondary | ICD-10-CM

## 2017-05-25 DIAGNOSIS — M79641 Pain in right hand: Secondary | ICD-10-CM

## 2017-05-25 DIAGNOSIS — G620 Drug-induced polyneuropathy: Secondary | ICD-10-CM | POA: Diagnosis not present

## 2017-05-25 DIAGNOSIS — M6281 Muscle weakness (generalized): Secondary | ICD-10-CM | POA: Diagnosis not present

## 2017-05-25 DIAGNOSIS — T451X5A Adverse effect of antineoplastic and immunosuppressive drugs, initial encounter: Secondary | ICD-10-CM | POA: Diagnosis not present

## 2017-05-25 DIAGNOSIS — D701 Agranulocytosis secondary to cancer chemotherapy: Secondary | ICD-10-CM

## 2017-05-25 DIAGNOSIS — M79642 Pain in left hand: Secondary | ICD-10-CM | POA: Diagnosis not present

## 2017-05-25 NOTE — Therapy (Signed)
[facility] [address], [place], [address] Phone: [phone]   Fax:  [phone]  Physical Therapy Treatment  Patient Details  Name: Sarah Horne MRN: [id number] Date of Birth: 1956/03/07 Referring Provider: Charlaine Dalton, MD  Encounter Date: 05/25/2017      PT End of Session - 05/25/17 1300    Visit Number 11   Number of Visits 13   Date for PT Re-Evaluation 06/03/17   PT Start Time 1300   PT Stop Time 1344   PT Time Calculation (min) 44 min   Equipment Utilized During Treatment Gait belt  SPC   Activity Tolerance Patient tolerated treatment well   Behavior During Therapy [facility] for tasks assessed/performed      Past Medical History:  Diagnosis Date  . Chemotherapy-induced peripheral neuropathy ([place])   . Epistaxis    a. 11/2016 in setting of asa/plavix-->silver nitrate cauterization.  . GI bleed    a. 11/2016 Admission w/ GIB and hypovolemic shock req 3u PRBC's;  b. 11/2016 ECG: gastritis & nonbleeding peptic ulcer; c. 11/2016 Conlonoscopy: rectal and sigmoid colonic ulcers.  . Heart attack ([place])    a. 1998 Cath @ [facility]: reportedly no intervention required.  Marland Kitchen Herceptin-induced cardiomyopathy ([place])    a. In the setting of Herceptin Rx for breast cancer (initiated 12/2014); b. 03/2015 MUGA EF 64%; b. 08/2015 MUGA: EF 51%; c. 10/2015 MUGA: EF 44%; d. 11/2015 Echo: EF 45-50%; e. 01/2016 MUGA: EF 60%; f. 06/2016 MUGA EF 65%; g. 10/2016 MUGA: EF 61%;  h. 12/2016 Echo: EF 55-60%, gr1 DD.  Marland Kitchen Hyperlipidemia   . Hypertension   . Neuropathy   . Possible PAD (peripheral artery disease) ([place])    a. 11/2016 LE cyanosis and weak pulses-->CTA w/o significant Ao-BiFem dzs. ? distal dzs-->ASA/Plavix initiated by vascular surgery.  Marland Kitchen PSVT (paroxysmal supraventricular tachycardia) ([place])    a. Dx 11/2016.  . Pulmonary embolism ([place])    a. 12/2016 CTA Chest: small nonocclusive PE in inferior segment of the Left lingula,  somewhat eccentric filling defect suggesting chronic rather than acute embolic event; b. 10/5850 LE U/S:  No DVT; c. 12/2016 Echo: Nl RV fxn, nl PASP.  Marland Kitchen Recurrent Metastatic breast cancer ([place])    a. Dx 2016: Stage II, ER positive, PR positive, HER-2/neu overexpressing of the left breast-->chemo/radiation; b. 10/2016 CT Abd/pelvis: diffuse liver mets, ill defined sclerotic bone lesions-T12;  c. 10/2016 MRI brain: metastatic lesion along L temporal lobe (19x6m) w/ extensive surrounding edema & 524mmidline shift to right.  . Sinus tachycardia     Past Surgical History:  Procedure Laterality Date  . BREAST BIOPSY Left 2016   Positive  . BREAST LUMPECTOMY WITH SENTINEL LYMPH NODE BIOPSY Left 05/23/2015   Procedure: LEFT BREAST WIDE EXCISION WITH AXILLARY DISSECTION, MASTOPLASTY ;  Surgeon: JeRobert BellowMD;  Location: [facility] ORS;  Service: General;  Laterality: Left;  . BREAST SURGERY Left 12/18/14   breast biopsy/INVASIVE DUCTAL CARCINOMA OF BREAST, NOTTINGHAM GRADE 2.  . Marland KitchenREAST SURGERY  05/23/2015.   Wide excision/mastoplasty, axillary dissection. No residual invasive cancer, positive for residual DCIS. 0/2 nodes identified on axillary dissection. (no SLN by technetium or methylene blue)  . CARDIAC CATHETERIZATION    . COLONOSCOPY WITH PROPOFOL N/A 12/10/2016   Procedure: COLONOSCOPY WITH PROPOFOL;  Surgeon: DaLucilla LameMD;  Location: [facility] ENDOSCOPY;  Service: Endoscopy;  Laterality: N/A;  . COLONOSCOPY WITH PROPOFOL N/A 02/13/2017   Procedure: COLONOSCOPY WITH PROPOFOL;  Surgeon:  Lucilla Lame, MD;  Location: [facility];  Service: Endoscopy;  Laterality: N/A;  . ESOPHAGOGASTRODUODENOSCOPY (EGD) WITH PROPOFOL N/A 12/08/2016   Procedure: ESOPHAGOGASTRODUODENOSCOPY (EGD) WITH PROPOFOL;  Surgeon: Lucilla Lame, MD;  Location: [facility] END[facility];  Service: Endoscopy;  Laterality: N/A;  . IVC FILTER INSERTION N/A 02/15/2017   Procedure: IVC Filter Insertion;  Surgeon: Algernon Huxley, MD;  Location: [facility];  Service: Cardiovascular;  Laterality: N/A;  . PORTACATH PLACEMENT Right 12-31-14   Dr Bary Castilla    There were no vitals filed for this visit.      Subjective Assessment - 05/25/17 1303    Subjective No pain or discomfort other than her hands and feet. Walking is fine.    Pertinent History Bilateral UE and LE weakness. Pt states that she feels like her weakness came from her neuropathy. Difficulty writing. Both hands feel numb currently but can feel her hand when she touches it. Has had the weakness for about 3 months (since April 2018) which began gradually.  Went through chemo and radiation last year for about 2.5 months until August 2017.  Still has cancer in her L breast which also traveled to her brain and liver.   Pt also lost 10 lbs since she was diagnosed with cancer 2 years ago.  Apetite is fine. Normally eats breakfast at times. Normally eats one good meal during the day and eats junk in between such as cookies and sodas.  Eats a frozen dinner during lunch. Does not know if she gets enough protein.   Only has pain in her feet and hands bilaterally.  No neck or back pain.   Has been using a rw since May 2018.  Was independent with gait before May 2018.  Pt goal is to improve her function.    Patient Stated Goals I want to be able to get out of the walker and not use a cane. Be able to do all the things she used to do such as cooking a meal, laundry,    Currently in Pain? Other (Comment)  No pain other than hands or feet   Pain Score 0-No pain            [facility] PT Assessment - 05/25/17 1309      Berg Balance Test   Sit to Stand Able to stand  independently using hands   Standing Unsupported Able to stand safely 2 minutes   Sitting with Back Unsupported but Feet Supported on Floor or Stool Able to sit safely and securely 2 minutes   Stand to Sit Controls descent by using hands   Transfers Able to transfer safely, definite need of hands   Standing Unsupported with Eyes  Closed Able to stand 10 seconds with supervision   Standing Ubsupported with Feet Together Able to place feet together independently and stand for 1 minute with supervision   From Standing, Reach Forward with Outstretched Arm Can reach forward >12 cm safely (5")   From Standing Position, Pick up Object from Floor Unable to pick up shoe, but reaches 2-5 cm (1-2") from shoe and balances independently   From Standing Position, Turn to Look Behind Over each Shoulder Turn sideways only but maintains balance   Turn 360 Degrees Able to turn 360 degrees safely but slowly   Standing Unsupported, Alternately Place Feet on Step/Stool Able to stand independently and safely and complete 8 steps in 20 seconds   Standing Unsupported, One Foot in Front Able to plae foot ahead of  the other independently and hold 30 seconds   Standing on One Leg Able to lift leg independently and hold 5-10 seconds   Total Score 42   Berg comment: > 46/56 suggests decreased fall risk                              PT Education - 05/25/17 1302    Education provided Yes   Education Details ther-ex   [employer]) Educated Patient   Methods Explanation;Demonstration;Tactile cues;Verbal cues   Comprehension Returned demonstration;Verbalized understanding        Objectives  Pt currently ambulating with SPC  There-ex  Blood pressure R arm sitting, mechanically taken: 110/67, HR 101 SpO2 99% room Air   Directed patient with sit <> stand throughout session Stand pivot transfer chair <> low mat table Static standing shoulder width apart, then with eyes closed, then with eyes open feet together, tandem stance with feet shoulder width apart Picking up an object (computer mouse) from the floor,  Turning 360 degrees to the R and to the L 1x Looking behind to the R and to the L,  Standing forward reach,   Standing alternate toe taps 4 x each LE onto first regular step  Reviewed current status with  BERG balance test. Pt was recommended to continue using her SPC.   Gait without AD 500 ft, and throughout session CGA to SBA. No LOB  Standing bilateral gastroc stretch at rocker board 5 seconds for 2 minutes to promote ankle DF ROM  Stepping over half bolster without UE assist 10x2 each LE. Difficulty at first, but able to perform safely and without LOB and better foot clearance after practice.    Improved exercise technique, movement at target joints, use of target muscles after min to mod verbal, visual, tactile cues.    Able to ambulate independently 500 ft without use of AD CGA to SBA, no LOB. Pt however scored 42/56 at the [facility] suggesting need for use of AD. Pt also better able to step over an obstacle (1/2 bolster) without LOB using bilateral LE and better foot clearance and ability to place and maintain her center of gravity over her base of support with practice.            PT Long Term Goals - 05/20/17 1328      PT LONG TERM GOAL #1   Title Patient will be able to perform her TUG time using a rw to 18 seconds or less to promote functional mobility.   Baseline 25.3 seconds average with rw (04/20/2017); 12 seconds on average with SPC (05/20/2017)   Time 6   Period Weeks   Status Achieved     PT LONG TERM GOAL #2   Title Patient will improve bilateral UE and bilateral hip, and knee strength to promote ability to perform functional tasks at home.    Time 4   Period Weeks   Status On-going   Target Date 06/17/17     PT LONG TERM GOAL #3   Title Patient will improve her LEFS score by at least 9 points as a demonstration of improved function.    Baseline 25/80 (04/20/2017); 57/80 (05/20/2017)   Time 6   Period Weeks   Status Achieved     PT LONG TERM GOAL #4   Title Patient will improve her Quick Dash Disability/Symptom score by at least 15% as a demonstration of improved function.    Baseline 70.45% (  04/20/2017); 25% (05/20/2017)   Time 6   Period Weeks   Status  Achieved     PT LONG TERM GOAL #5   Title Patient will be able to perform her TUG time without AD to 12 seconds or less to promote functional mobility.   Baseline 12 seconds with SPC (05/20/2017)   Time 4   Period Weeks   Status New   Target Date 06/17/17     Additional Long Term Goals   Additional Long Term Goals Yes     PT LONG TERM GOAL #6   Title Pt will be able to ambulate at least 6 minutes without AD with SBA and no LOB to promote mobility and function.    Baseline able to ambulate 400 ft without AD with CGA, no LOB (05/20/2017)   Time 4   Period Weeks   Status New   Target Date 06/17/17               Plan - 05/25/17 1258    Clinical Impression Statement Able to ambulate independently 500 ft without use of AD CGA to SBA, no LOB. Pt however scored 42/56 at the [facility] suggesting need for use of AD. Pt also better able to step over an obstacle (1/2 bolster) without LOB using bilateral LE and better foot clearance and ability to place and maintain her center of gravity over her base of support with practice.    History and Personal Factors relevant to plan of care: hx of CA, weakness, multiple health problems   Clinical Presentation Stable   Clinical Presentation due to: improving ability to ambulate and negotiate obstacles   Clinical Decision Making Low   Rehab Potential Fair   Clinical Impairments Affecting Rehab Potential hx of CA, multiple health problems   PT Frequency 2x / week   PT Duration 6 weeks   PT Treatment/Interventions Manual techniques;Patient/family education;Therapeutic activities;Therapeutic exercise;Gait training;Neuromuscular re-education;Balance training   PT Next Visit Plan hip, scapular, LE, UE strengthening, gait, balance, femoral control   Consulted and Agree with Plan of Care Patient      Patient will benefit from skilled therapeutic intervention in order to improve the following deficits and impairments:  Pain, Improper body mechanics, Postural  dysfunction, Impaired UE functional use, Decreased strength, Difficulty walking, Decreased endurance  Visit Diagnosis: Muscle weakness (generalized)  Difficulty in walking, not elsewhere classified     Problem List Patient Active Problem List   Diagnosis Date Noted  . Brain metastasis ([place]) 05/19/2017  . Blood in stool   . Polyp of sigmoid colon   . Benign neoplasm of transverse colon   . Benign neoplasm of ascending colon   . Lower GI bleed   . Weakness of right lower extremity 02/11/2017  . Metastatic breast cancer ([place]) 01/18/2017  . Orthostatic lightheadedness 01/14/2017  . Atrial tachycardia ([place]) 01/14/2017  . Hypokalemia 01/14/2017  . Pulmonary emboli ([place]) 01/12/2017  . Dehydration 12/25/2016  . Blood loss anemia 12/15/2016  . Ulceration of colon   . Heartburn   . Duodenitis   . Gastritis without bleeding   . GI bleed   . Acute esophagitis   . Acute esophagogastric ulcer   . Rectal bleed   . Hypovolemic shock ([place])   . Ischemic leg 12/04/2016  . Chemotherapy induced neutropenia ([place]) 11/20/2016  . Palliative care by specialist   . Goals of care, counseling/discussion   . DNR (do not resuscitate) discussion   . Cerebral edema ([place]) 11/10/2016  . Encounter for  monitoring cardiotoxic drug therapy 11/09/2016  . Elevated LFTs 10/20/2016  . URI with cough and congestion 08/25/2016  . Peripheral neuropathy due to chemotherapy ([place]) 03/13/2016  . Encounter for antineoplastic chemotherapy 03/13/2016  . HLD (hyperlipidemia) 01/29/2016  . Hyperglycemia, unspecified 01/29/2016  . Cardiomyopathy ([place]) 01/13/2016  . Hypertension 07/18/2015  . Carcinoma of upper-outer quadrant of left breast in female, estrogen receptor positive ([place]) 12/27/2014    Joneen Boers PT, DPT   05/25/2017, 1:54 PM  [facility] [address] [place], [address] Phone: [phone]   Fax:  [phone]  Name: Sarah Horne MRN: [id number] Date of Birth: Aug 22, 1956

## 2017-05-25 NOTE — Therapy (Signed)
[facility] [address] [place], [address] Phone: [phone]   Fax:  [phone]  Occupational Therapy Treatment  Patient Details  Name: Sarah Horne MRN: [id number] Date of Birth: 1956-05-26 Referring Provider: Rogue Bussing  Encounter Date: 05/25/2017      OT End of Session - 05/25/17 1440    Visit Number 4   Number of Visits 12   Date for OT Re-Evaluation 06/14/17   OT Start Time 1350   OT Stop Time 1425   OT Time Calculation (min) 35 min   Activity Tolerance Patient tolerated treatment well   Behavior During Therapy [facility] for tasks assessed/performed      Past Medical History:  Diagnosis Date  . Chemotherapy-induced peripheral neuropathy ([place])   . Epistaxis    a. 11/2016 in setting of asa/plavix-->silver nitrate cauterization.  . GI bleed    a. 11/2016 Admission w/ GIB and hypovolemic shock req 3u PRBC's;  b. 11/2016 ECG: gastritis & nonbleeding peptic ulcer; c. 11/2016 Conlonoscopy: rectal and sigmoid colonic ulcers.  . Heart attack ([place])    a. 1998 Cath @ [facility]: reportedly no intervention required.  Marland Kitchen Herceptin-induced cardiomyopathy ([place])    a. In the setting of Herceptin Rx for breast cancer (initiated 12/2014); b. 03/2015 MUGA EF 64%; b. 08/2015 MUGA: EF 51%; c. 10/2015 MUGA: EF 44%; d. 11/2015 Echo: EF 45-50%; e. 01/2016 MUGA: EF 60%; f. 06/2016 MUGA EF 65%; g. 10/2016 MUGA: EF 61%;  h. 12/2016 Echo: EF 55-60%, gr1 DD.  Marland Kitchen Hyperlipidemia   . Hypertension   . Neuropathy   . Possible PAD (peripheral artery disease) ([place])    a. 11/2016 LE cyanosis and weak pulses-->CTA w/o significant Ao-BiFem dzs. ? distal dzs-->ASA/Plavix initiated by vascular surgery.  Marland Kitchen PSVT (paroxysmal supraventricular tachycardia) ([place])    a. Dx 11/2016.  . Pulmonary embolism ([place])    a. 12/2016 CTA Chest: small nonocclusive PE in inferior segment of the Left lingula, somewhat eccentric filling defect suggesting chronic rather than  acute embolic event; b. 01/4007 LE U/S:  No DVT; c. 12/2016 Echo: Nl RV fxn, nl PASP.  Marland Kitchen Recurrent Metastatic breast cancer ([place])    a. Dx 2016: Stage II, ER positive, PR positive, HER-2/neu overexpressing of the left breast-->chemo/radiation; b. 10/2016 CT Abd/pelvis: diffuse liver mets, ill defined sclerotic bone lesions-T12;  c. 10/2016 MRI brain: metastatic lesion along L temporal lobe (19x40m) w/ extensive surrounding edema & 541mmidline shift to right.  . Sinus tachycardia     Past Surgical History:  Procedure Laterality Date  . BREAST BIOPSY Left 2016   Positive  . BREAST LUMPECTOMY WITH SENTINEL LYMPH NODE BIOPSY Left 05/23/2015   Procedure: LEFT BREAST WIDE EXCISION WITH AXILLARY DISSECTION, MASTOPLASTY ;  Surgeon: JeRobert BellowMD;  Location: [facility] ORS;  Service: General;  Laterality: Left;  . BREAST SURGERY Left 12/18/14   breast biopsy/INVASIVE DUCTAL CARCINOMA OF BREAST, NOTTINGHAM GRADE 2.  . Marland KitchenREAST SURGERY  05/23/2015.   Wide excision/mastoplasty, axillary dissection. No residual invasive cancer, positive for residual DCIS. 0/2 nodes identified on axillary dissection. (no SLN by technetium or methylene blue)  . CARDIAC CATHETERIZATION    . COLONOSCOPY WITH PROPOFOL N/A 12/10/2016   Procedure: COLONOSCOPY WITH PROPOFOL;  Surgeon: DaLucilla LameMD;  Location: [facility] END[facility];  Service: Endoscopy;  Laterality: N/A;  . COLONOSCOPY WITH PROPOFOL N/A 02/13/2017   Procedure: COLONOSCOPY WITH PROPOFOL;  Surgeon: WoLucilla LameMD;  [facility];  Service: Endoscopy;  Laterality:  N/A;  . ESOPHAGOGASTRODUODENOSCOPY (EGD) WITH PROPOFOL N/A 12/08/2016   Procedure: ESOPHAGOGASTRODUODENOSCOPY (EGD) WITH PROPOFOL;  Surgeon: Lucilla Lame, MD;  Location: [facility];  Service: Endoscopy;  Laterality: N/A;  . IVC FILTER INSERTION N/A 02/15/2017   Procedure: IVC Filter Insertion;  Surgeon: Algernon Huxley, MD;  Location: [facility] CV LAB;  Service: Cardiovascular;  Laterality: N/A;  .  PORTACATH PLACEMENT Right 12-31-14   Dr Bary Castilla    There were no vitals filed for this visit.      Subjective Assessment - 05/25/17 1439    Subjective  I am able to bath and dress, write better - did bake pound cake this am - did not do one since Febr - getting better - tried to sleep with my elbows more straight and no propping up on my elbow    Patient Stated Goals Patient reports she would like to be able to write and do all the things she did before.    Currently in Pain? Yes   Pain Score 2    Pain Location Hand   Pain Orientation Right;Left   Pain Descriptors / Indicators Sore            [facility] OT Assessment - 05/25/17 0001      Strength   Right Hand Grip (lbs) 28   Right Hand Lateral Pinch 6 lbs   Right Hand 3 Point Pinch 6 lbs   Left Hand Grip (lbs) 30   Left Hand Lateral Pinch 7 lbs   Left Hand 3 Point Pinch 6 lbs       assess grip and prehension strength  and preforming HEP assess with green putty and add  pullling and twisting bilateral hands to HEP  Show Froments test with lat grip - R worse than L  Positive Tinel for cubital tunnel bilateral but R worse than L     Discuss with pt positioning of elbow during sleeping or sitting  And what to avoid  And add and review Ulnar N glide and Medial N glides  5 reps  2 x day  Pt needed mod A - and elbow extention was impaired but improve after 5-6 reps of ulnar N glide on R  [place] at 3.4MHZ, 20% and .8 over R cubital tunnel to decrease inflammation at end of session                     OT Education - 05/25/17 1440    Education provided Yes   Education Details review nerve glides again - and add 2 putty HEP    Person(s) Educated Patient   Methods Explanation;Demonstration;Tactile cues   Comprehension Returned demonstration;Verbalized understanding             OT Long Term Goals - 05/09/17 2113      OT LONG TERM GOAL #1   Title Patient will demonstrate home exercise program with  modified independence.    Baseline none   Time 3   Period Weeks   Status New   Target Date 05/24/17     OT LONG TERM GOAL #2   Title Patient will increase grip strength in bilateral hands by 10# to assist with holding objects and opening containers.    Baseline R 8#, L 16#   Time 6   Period Weeks   Status New   Target Date 06/14/17     OT LONG TERM GOAL #3   Title Patient will complete peeling and cutting vegetables with modified independence  using adaptive equipment as needed.    Baseline moderate assist   Time 6   Period Weeks   Status New   Target Date 06/14/17     OT LONG TERM GOAL #4   Title Patient will improve pinch by 2# to assist with pulling button out on washer with modified independence.    Baseline limited pinch BUE   Time 6   Period Weeks   Status New   Target Date 06/14/17     OT LONG TERM GOAL #5   Title Patient will report pain in bilateral hands 2 or less to perform daily tasks.    Baseline 4 at eval   Time 6   Period Weeks   Status New   Target Date 06/14/17               Plan - 05/25/17 1441    Clinical Impression Statement Pt cont to make progress in grip and prehension strength bilateral hands- increase functional use - cubital tunnel worse on R than L - Tinel positive, Froments  - elbow extention tight but improve after review of Ulnar N glides - pt  needed assistance to do nerve glides correctly    Occupational performance deficits (Please refer to evaluation for details): ADL's;IADL's;Play;Leisure   Rehab Potential Good   Current Impairments/barriers affecting progress: Age, comorbidities, history of cancer with chemo tx and neuropathy.     OT Frequency 1x / week   OT Duration 4 weeks   OT Treatment/Interventions Self-care/ADL training;Moist Heat;Fluidtherapy;DME and/or AE instruction;Patient/family education;Splinting;Contrast Bath;Therapeutic exercises;Ultrasound;Therapeutic exercise;Therapeutic activities;Neuromuscular  education;Parrafin;Energy conservation;Manual Therapy   Plan reassess progress and update HEP    Clinical Decision Making Limited treatment options, no task modification necessary   OT Home Exercise Plan Ulnar and Med  Nglides added - cont with putty    Consulted and Agree with Plan of Care Patient      Patient will benefit from skilled therapeutic intervention in order to improve the following deficits and impairments:  Impaired UE functional use, Pain, Decreased strength  Visit Diagnosis: Muscle weakness (generalized)  Chemotherapy induced neutropenia ([facility])  Pain in right hand  Pain in left hand    Problem List Patient Active Problem List   Diagnosis Date Noted  . Brain metastasis ([place]) 05/19/2017  . Blood in stool   . Polyp of sigmoid colon   . Benign neoplasm of transverse colon   . Benign neoplasm of ascending colon   . Lower GI bleed   . Weakness of right lower extremity 02/11/2017  . Metastatic breast cancer ([place]) 01/18/2017  . Orthostatic lightheadedness 01/14/2017  . Atrial tachycardia ([place]) 01/14/2017  . Hypokalemia 01/14/2017  . Pulmonary emboli ([place]) 01/12/2017  . Dehydration 12/25/2016  . Blood loss anemia 12/15/2016  . Ulceration of colon   . Heartburn   . Duodenitis   . Gastritis without bleeding   . GI bleed   . Acute esophagitis   . Acute esophagogastric ulcer   . Rectal bleed   . Hypovolemic shock ([place])   . Ischemic leg 12/04/2016  . Chemotherapy induced neutropenia ([place]) 11/20/2016  . Palliative care by specialist   . Goals of care, counseling/discussion   . DNR (do not resuscitate) discussion   . Cerebral edema ([place]) 11/10/2016  . Encounter for monitoring cardiotoxic drug therapy 11/09/2016  . Elevated LFTs 10/20/2016  . URI with cough and congestion 08/25/2016  . Peripheral neuropathy due to chemotherapy ([place]) 03/13/2016  . Encounter for antineoplastic chemotherapy 03/13/2016  . HLD (  hyperlipidemia) 01/29/2016  . Hyperglycemia,  unspecified 01/29/2016  . Cardiomyopathy ([place]) 01/13/2016  . Hypertension 07/18/2015  . Carcinoma of upper-outer quadrant of left breast in female, estrogen receptor positive ([place]) 12/27/2014    Sarah Horne, Sarah Horne 05/25/2017, 2:43 PM  [facility] [address], [place], [address] Phone: [phone]   Fax:  [phone]  Name: Sarah Horne MRN: [id number] Date of Birth: 12-09-1955

## 2017-05-26 ENCOUNTER — Inpatient Hospital Stay: Payer: BLUE CROSS/BLUE SHIELD

## 2017-05-26 DIAGNOSIS — Z170 Estrogen receptor positive status [ER+]: Secondary | ICD-10-CM

## 2017-05-26 DIAGNOSIS — E860 Dehydration: Secondary | ICD-10-CM

## 2017-05-26 DIAGNOSIS — G629 Polyneuropathy, unspecified: Secondary | ICD-10-CM | POA: Diagnosis not present

## 2017-05-26 DIAGNOSIS — Z79818 Long term (current) use of other agents affecting estrogen receptors and estrogen levels: Secondary | ICD-10-CM | POA: Diagnosis not present

## 2017-05-26 DIAGNOSIS — K7290 Hepatic failure, unspecified without coma: Secondary | ICD-10-CM | POA: Diagnosis not present

## 2017-05-26 DIAGNOSIS — Z923 Personal history of irradiation: Secondary | ICD-10-CM | POA: Diagnosis not present

## 2017-05-26 DIAGNOSIS — Z86711 Personal history of pulmonary embolism: Secondary | ICD-10-CM | POA: Diagnosis not present

## 2017-05-26 DIAGNOSIS — C50412 Malignant neoplasm of upper-outer quadrant of left female breast: Secondary | ICD-10-CM

## 2017-05-26 DIAGNOSIS — I471 Supraventricular tachycardia: Secondary | ICD-10-CM | POA: Diagnosis not present

## 2017-05-26 DIAGNOSIS — E785 Hyperlipidemia, unspecified: Secondary | ICD-10-CM | POA: Diagnosis not present

## 2017-05-26 DIAGNOSIS — E876 Hypokalemia: Secondary | ICD-10-CM | POA: Diagnosis not present

## 2017-05-26 DIAGNOSIS — Z79899 Other long term (current) drug therapy: Secondary | ICD-10-CM | POA: Diagnosis not present

## 2017-05-26 DIAGNOSIS — C7931 Secondary malignant neoplasm of brain: Secondary | ICD-10-CM | POA: Diagnosis not present

## 2017-05-26 DIAGNOSIS — I10 Essential (primary) hypertension: Secondary | ICD-10-CM | POA: Diagnosis not present

## 2017-05-26 DIAGNOSIS — Z9221 Personal history of antineoplastic chemotherapy: Secondary | ICD-10-CM | POA: Diagnosis not present

## 2017-05-26 DIAGNOSIS — F17210 Nicotine dependence, cigarettes, uncomplicated: Secondary | ICD-10-CM | POA: Diagnosis not present

## 2017-05-26 DIAGNOSIS — M8580 Other specified disorders of bone density and structure, unspecified site: Secondary | ICD-10-CM | POA: Diagnosis not present

## 2017-05-26 LAB — CBC WITH DIFFERENTIAL/PLATELET
BASOS PCT: 1 %
Basophils Absolute: 0 10*3/uL (ref 0–0.1)
EOS ABS: 0.5 10*3/uL (ref 0–0.7)
Eosinophils Relative: 20 %
HCT: 32.6 % — ABNORMAL LOW (ref 35.0–47.0)
HEMOGLOBIN: 11.1 g/dL — AB (ref 12.0–16.0)
Lymphocytes Relative: 26 %
Lymphs Abs: 0.6 10*3/uL — ABNORMAL LOW (ref 1.0–3.6)
MCH: 30.8 pg (ref 26.0–34.0)
MCHC: 34 g/dL (ref 32.0–36.0)
MCV: 90.7 fL (ref 80.0–100.0)
MONO ABS: 0.2 10*3/uL (ref 0.2–0.9)
MONOS PCT: 7 %
NEUTROS PCT: 46 %
Neutro Abs: 1.1 10*3/uL — ABNORMAL LOW (ref 1.4–6.5)
Platelets: 173 10*3/uL (ref 150–440)
RBC: 3.6 MIL/uL — ABNORMAL LOW (ref 3.80–5.20)
RDW: 22.4 % — AB (ref 11.5–14.5)
WBC: 2.3 10*3/uL — ABNORMAL LOW (ref 3.6–11.0)

## 2017-05-26 MED ORDER — TBO-FILGRASTIM 480 MCG/0.8ML ~~LOC~~ SOSY
480.0000 ug | PREFILLED_SYRINGE | Freq: Once | SUBCUTANEOUS | Status: AC
  Administered 2017-05-26: 480 ug via SUBCUTANEOUS
  Filled 2017-05-26: qty 0.8

## 2017-05-27 ENCOUNTER — Ambulatory Visit: Payer: BLUE CROSS/BLUE SHIELD | Admitting: Occupational Therapy

## 2017-05-27 ENCOUNTER — Ambulatory Visit: Payer: BLUE CROSS/BLUE SHIELD

## 2017-05-27 ENCOUNTER — Other Ambulatory Visit: Payer: Self-pay | Admitting: Internal Medicine

## 2017-05-27 ENCOUNTER — Inpatient Hospital Stay: Payer: BLUE CROSS/BLUE SHIELD

## 2017-05-27 DIAGNOSIS — E785 Hyperlipidemia, unspecified: Secondary | ICD-10-CM | POA: Diagnosis not present

## 2017-05-27 DIAGNOSIS — G620 Drug-induced polyneuropathy: Secondary | ICD-10-CM | POA: Diagnosis not present

## 2017-05-27 DIAGNOSIS — R262 Difficulty in walking, not elsewhere classified: Secondary | ICD-10-CM

## 2017-05-27 DIAGNOSIS — C50412 Malignant neoplasm of upper-outer quadrant of left female breast: Secondary | ICD-10-CM | POA: Diagnosis not present

## 2017-05-27 DIAGNOSIS — Z923 Personal history of irradiation: Secondary | ICD-10-CM | POA: Diagnosis not present

## 2017-05-27 DIAGNOSIS — Z79818 Long term (current) use of other agents affecting estrogen receptors and estrogen levels: Secondary | ICD-10-CM | POA: Diagnosis not present

## 2017-05-27 DIAGNOSIS — M6281 Muscle weakness (generalized): Secondary | ICD-10-CM

## 2017-05-27 DIAGNOSIS — M8580 Other specified disorders of bone density and structure, unspecified site: Secondary | ICD-10-CM | POA: Diagnosis not present

## 2017-05-27 DIAGNOSIS — D708 Other neutropenia: Secondary | ICD-10-CM

## 2017-05-27 DIAGNOSIS — I471 Supraventricular tachycardia: Secondary | ICD-10-CM | POA: Diagnosis not present

## 2017-05-27 DIAGNOSIS — Z170 Estrogen receptor positive status [ER+]: Secondary | ICD-10-CM | POA: Diagnosis not present

## 2017-05-27 DIAGNOSIS — C7931 Secondary malignant neoplasm of brain: Secondary | ICD-10-CM | POA: Diagnosis not present

## 2017-05-27 DIAGNOSIS — E876 Hypokalemia: Secondary | ICD-10-CM | POA: Diagnosis not present

## 2017-05-27 DIAGNOSIS — M79641 Pain in right hand: Secondary | ICD-10-CM | POA: Diagnosis not present

## 2017-05-27 DIAGNOSIS — G629 Polyneuropathy, unspecified: Secondary | ICD-10-CM | POA: Diagnosis not present

## 2017-05-27 DIAGNOSIS — K7290 Hepatic failure, unspecified without coma: Secondary | ICD-10-CM | POA: Diagnosis not present

## 2017-05-27 DIAGNOSIS — F17210 Nicotine dependence, cigarettes, uncomplicated: Secondary | ICD-10-CM | POA: Diagnosis not present

## 2017-05-27 DIAGNOSIS — I10 Essential (primary) hypertension: Secondary | ICD-10-CM | POA: Diagnosis not present

## 2017-05-27 DIAGNOSIS — Z86711 Personal history of pulmonary embolism: Secondary | ICD-10-CM | POA: Diagnosis not present

## 2017-05-27 DIAGNOSIS — T451X5A Adverse effect of antineoplastic and immunosuppressive drugs, initial encounter: Secondary | ICD-10-CM | POA: Diagnosis not present

## 2017-05-27 DIAGNOSIS — Z9221 Personal history of antineoplastic chemotherapy: Secondary | ICD-10-CM | POA: Diagnosis not present

## 2017-05-27 DIAGNOSIS — Z79899 Other long term (current) drug therapy: Secondary | ICD-10-CM | POA: Diagnosis not present

## 2017-05-27 DIAGNOSIS — M79642 Pain in left hand: Secondary | ICD-10-CM | POA: Diagnosis not present

## 2017-05-27 LAB — CANCER ANTIGEN 27.29: CA 27.29: 15.7 U/mL (ref 0.0–38.6)

## 2017-05-27 MED ORDER — TBO-FILGRASTIM 480 MCG/0.8ML ~~LOC~~ SOSY
480.0000 ug | PREFILLED_SYRINGE | Freq: Once | SUBCUTANEOUS | Status: AC
  Administered 2017-05-27: 480 ug via SUBCUTANEOUS
  Filled 2017-05-27: qty 0.8

## 2017-05-27 NOTE — Therapy (Signed)
[facility] [address], [place], [address] Phone: [phone]   Fax:  [phone]  Physical Therapy Treatment  Patient Details  Name: Sarah Horne MRN: [id number] Date of Birth: 05/08/1956 Referring Provider: Charlaine Dalton, MD  Encounter Date: 05/27/2017      PT End of Session - 05/27/17 1256    Visit Number 12   Number of Visits 13   Date for PT Re-Evaluation 06/03/17   PT Start Time 9485   PT Stop Time 1341   PT Time Calculation (min) 45 min   Equipment Utilized During Treatment Gait belt  SPC   Activity Tolerance Patient tolerated treatment well   Behavior During Therapy [facility] for tasks assessed/performed      Past Medical History:  Diagnosis Date  . Chemotherapy-induced peripheral neuropathy ([place])   . Epistaxis    a. 11/2016 in setting of asa/plavix-->silver nitrate cauterization.  . GI bleed    a. 11/2016 Admission w/ GIB and hypovolemic shock req 3u PRBC's;  b. 11/2016 ECG: gastritis & nonbleeding peptic ulcer; c. 11/2016 Conlonoscopy: rectal and sigmoid colonic ulcers.  . Heart attack ([place])    a. 1998 Cath @ [facility]: reportedly no intervention required.  Marland Kitchen Herceptin-induced cardiomyopathy ([place])    a. In the setting of Herceptin Rx for breast cancer (initiated 12/2014); b. 03/2015 MUGA EF 64%; b. 08/2015 MUGA: EF 51%; c. 10/2015 MUGA: EF 44%; d. 11/2015 Echo: EF 45-50%; e. 01/2016 MUGA: EF 60%; f. 06/2016 MUGA EF 65%; g. 10/2016 MUGA: EF 61%;  h. 12/2016 Echo: EF 55-60%, gr1 DD.  Marland Kitchen Hyperlipidemia   . Hypertension   . Neuropathy   . Possible PAD (peripheral artery disease) ([place])    a. 11/2016 LE cyanosis and weak pulses-->CTA w/o significant Ao-BiFem dzs. ? distal dzs-->ASA/Plavix initiated by vascular surgery.  Marland Kitchen PSVT (paroxysmal supraventricular tachycardia) ([place])    a. Dx 11/2016.  . Pulmonary embolism ([place])    a. 12/2016 CTA Chest: small nonocclusive PE in inferior segment of the Left lingula,  somewhat eccentric filling defect suggesting chronic rather than acute embolic event; b. 12/6268 LE U/S:  No DVT; c. 12/2016 Echo: Nl RV fxn, nl PASP.  Marland Kitchen Recurrent Metastatic breast cancer ([place])    a. Dx 2016: Stage II, ER positive, PR positive, HER-2/neu overexpressing of the left breast-->chemo/radiation; b. 10/2016 CT Abd/pelvis: diffuse liver mets, ill defined sclerotic bone lesions-T12;  c. 10/2016 MRI brain: metastatic lesion along L temporal lobe (19x10m) w/ extensive surrounding edema & 56mmidline shift to right.  . Sinus tachycardia     Past Surgical History:  Procedure Laterality Date  . BREAST BIOPSY Left 2016   Positive  . BREAST LUMPECTOMY WITH SENTINEL LYMPH NODE BIOPSY Left 05/23/2015   Procedure: LEFT BREAST WIDE EXCISION WITH AXILLARY DISSECTION, MASTOPLASTY ;  Surgeon: JeRobert BellowMD;  Location: [facility] ORS;  Service: General;  Laterality: Left;  . BREAST SURGERY Left 12/18/14   breast biopsy/INVASIVE DUCTAL CARCINOMA OF BREAST, NOTTINGHAM GRADE 2.  . Marland KitchenREAST SURGERY  05/23/2015.   Wide excision/mastoplasty, axillary dissection. No residual invasive cancer, positive for residual DCIS. 0/2 nodes identified on axillary dissection. (no SLN by technetium or methylene blue)  . CARDIAC CATHETERIZATION    . COLONOSCOPY WITH PROPOFOL N/A 12/10/2016   Procedure: COLONOSCOPY WITH PROPOFOL;  Surgeon: DaLucilla LameMD;  Location: [facility] ENDOSCOPY;  Service: Endoscopy;  Laterality: N/A;  . COLONOSCOPY WITH PROPOFOL N/A 02/13/2017   Procedure: COLONOSCOPY WITH PROPOFOL;  Surgeon:  Lucilla Lame, MD;  Location: [facility];  Service: Endoscopy;  Laterality: N/A;  . ESOPHAGOGASTRODUODENOSCOPY (EGD) WITH PROPOFOL N/A 12/08/2016   Procedure: ESOPHAGOGASTRODUODENOSCOPY (EGD) WITH PROPOFOL;  Surgeon: Lucilla Lame, MD;  Location: [facility] END[facility]COPY;  Service: Endoscopy;  Laterality: N/A;  . IVC FILTER INSERTION N/A 02/15/2017   Procedure: IVC Filter Insertion;  Surgeon: Algernon Huxley, MD;  Location: [facility];  Service: Cardiovascular;  Laterality: N/A;  . PORTACATH PLACEMENT Right 12-31-14   Dr Bary Castilla    There were no vitals filed for this visit.      Subjective Assessment - 05/27/17 1301    Subjective No pain or discomfort other than her hands and feet.    Pertinent History Bilateral UE and LE weakness. Pt states that she feels like her weakness came from her neuropathy. Difficulty writing. Both hands feel numb currently but can feel her hand when she touches it. Has had the weakness for about 3 months (since April 2018) which began gradually.  Went through chemo and radiation last year for about 2.5 months until August 2017.  Still has cancer in her L breast which also traveled to her brain and liver.   Pt also lost 10 lbs since she was diagnosed with cancer 2 years ago.  Apetite is fine. Normally eats breakfast at times. Normally eats one good meal during the day and eats junk in between such as cookies and sodas.  Eats a frozen dinner during lunch. Does not know if she gets enough protein.   Only has pain in her feet and hands bilaterally.  No neck or back pain.   Has been using a rw since May 2018.  Was independent with gait before May 2018.  Pt goal is to improve her function.    Patient Stated Goals I want to be able to get out of the walker and not use a cane. Be able to do all the things she used to do such as cooking a meal, laundry,    Currently in Pain? Other (Comment)  hands and feet, no pain level provided                                 PT Education - 05/27/17 1302    Education provided Yes   Education Details ther-ex   [employer]) Educated Patient   Methods Explanation;Demonstration;Tactile cues;Verbal cues   Comprehension Returned demonstration;Verbalized understanding         Objectives  Pt observed to ambulate into clinic with her SPC   There-ex  Blood pressure R arm sitting, mechanically taken: 104/67, HR 109 SpO2 99% room  Air   Standing low rows red band 10x3 with 5 second holds  Standing heel toe raises with bilateral UE assist 10x2  Standing ankle DF/PF on rockerboard with 5 second stretches for gastrocnemius for 2 minutes  Gait around gym without use of AD SBA 500 ft. No LOB  Side stepping 32 ft to the R and 32 ft to the L, no AD  Stand to sit onto regular chair without UE assist 5x3. Improved control throughout range of stand to sit. No pillows used.   Forward step up onto and off Air Ex Pad with one UE to no UE assist 10x each LE  Stepping over fallen narrow based quad cane without UE assist, CGA 10x each LE. Able to perform without LOB.   Standing regular rows with scapular retraction resisting  red band 10x3 with 5 second holds    Improved exercise technique, movement at target joints, use of target muscles after min to mod verbal, visual, tactile cues.      Pt continues to be able to ambulate 500 ft without AD and able to ambulate laterally 32 ft x 2 without AD and no LOB. Better able to sit down onto a regular chair without UE assist with good control. Also able to step over a fallen narrow based quad cane without UE assist and no LOB. Pt making very good progress with PT towards function.               PT Long Term Goals - 05/20/17 1328      PT LONG TERM GOAL #1   Title Patient will be able to perform her TUG time using a rw to 18 seconds or less to promote functional mobility.   Baseline 25.3 seconds average with rw (04/20/2017); 12 seconds on average with SPC (05/20/2017)   Time 6   Period Weeks   Status Achieved     PT LONG TERM GOAL #2   Title Patient will improve bilateral UE and bilateral hip, and knee strength to promote ability to perform functional tasks at home.    Time 4   Period Weeks   Status On-going   Target Date 06/17/17     PT LONG TERM GOAL #3   Title Patient will improve her LEFS score by at least 9 points as a demonstration of improved function.     Baseline 25/80 (04/20/2017); 57/80 (05/20/2017)   Time 6   Period Weeks   Status Achieved     PT LONG TERM GOAL #4   Title Patient will improve her Quick Dash Disability/Symptom score by at least 15% as a demonstration of improved function.    Baseline 70.45% (04/20/2017); 25% (05/20/2017)   Time 6   Period Weeks   Status Achieved     PT LONG TERM GOAL #5   Title Patient will be able to perform her TUG time without AD to 12 seconds or less to promote functional mobility.   Baseline 12 seconds with SPC (05/20/2017)   Time 4   Period Weeks   Status New   Target Date 06/17/17     Additional Long Term Goals   Additional Long Term Goals Yes     PT LONG TERM GOAL #6   Title Pt will be able to ambulate at least 6 minutes without AD with SBA and no LOB to promote mobility and function.    Baseline able to ambulate 400 ft without AD with CGA, no LOB (05/20/2017)   Time 4   Period Weeks   Status New   Target Date 06/17/17               Plan - 05/27/17 1245    Clinical Impression Statement Pt continues to be able to ambulate 500 ft without AD and able to ambulate laterally 32 ft x 2 without AD and no LOB. Better able to sit down onto a regular chair without UE assist with good control. Also able to step over a fallen narrow based quad cane without UE assist and no LOB. Pt making very good progress with PT towards function.    History and Personal Factors relevant to plan of care: hx of CA, weakness, multiple health problems   Clinical Presentation Stable   Clinical Presentation due to: overall improving ability to ambulate and negotiate obstacles  Clinical Decision Making Low   Rehab Potential Fair   Clinical Impairments Affecting Rehab Potential hx of CA, multiple health problems   PT Frequency 2x / week   PT Duration 6 weeks   PT Treatment/Interventions Manual techniques;Patient/family education;Therapeutic activities;Therapeutic exercise;Gait training;Neuromuscular  re-education;Balance training   PT Next Visit Plan hip, scapular, LE, UE strengthening, gait, balance, femoral control   Consulted and Agree with Plan of Care Patient      Patient will benefit from skilled therapeutic intervention in order to improve the following deficits and impairments:  Pain, Improper body mechanics, Postural dysfunction, Impaired UE functional use, Decreased strength, Difficulty walking, Decreased endurance  Visit Diagnosis: Muscle weakness (generalized)  Difficulty in walking, not elsewhere classified     Problem List Patient Active Problem List   Diagnosis Date Noted  . Brain metastasis ([place]) 05/19/2017  . Blood in stool   . Polyp of sigmoid colon   . Benign neoplasm of transverse colon   . Benign neoplasm of ascending colon   . Lower GI bleed   . Weakness of right lower extremity 02/11/2017  . Metastatic breast cancer ([place]) 01/18/2017  . Orthostatic lightheadedness 01/14/2017  . Atrial tachycardia ([place]) 01/14/2017  . Hypokalemia 01/14/2017  . Pulmonary emboli ([place]) 01/12/2017  . Dehydration 12/25/2016  . Blood loss anemia 12/15/2016  . Ulceration of colon   . Heartburn   . Duodenitis   . Gastritis without bleeding   . GI bleed   . Acute esophagitis   . Acute esophagogastric ulcer   . Rectal bleed   . Hypovolemic shock ([place])   . Ischemic leg 12/04/2016  . Chemotherapy induced neutropenia ([place]) 11/20/2016  . Palliative care by specialist   . Goals of care, counseling/discussion   . DNR (do not resuscitate) discussion   . Cerebral edema ([place]) 11/10/2016  . Encounter for monitoring cardiotoxic drug therapy 11/09/2016  . Elevated LFTs 10/20/2016  . URI with cough and congestion 08/25/2016  . Peripheral neuropathy due to chemotherapy ([place]) 03/13/2016  . Encounter for antineoplastic chemotherapy 03/13/2016  . HLD (hyperlipidemia) 01/29/2016  . Hyperglycemia, unspecified 01/29/2016  . Cardiomyopathy ([place]) 01/13/2016  . Hypertension 07/18/2015   . Carcinoma of upper-outer quadrant of left breast in female, estrogen receptor positive ([place]) 12/27/2014    Joneen Boers PT, DPT   05/27/2017, 5:49 PM  [facility] [address] [place], [address] Phone: [phone]   Fax:  [phone]  Name: Sarah Horne MRN: [id number] Date of Birth: 1955-12-24

## 2017-05-28 ENCOUNTER — Inpatient Hospital Stay: Payer: BLUE CROSS/BLUE SHIELD

## 2017-05-28 DIAGNOSIS — C50412 Malignant neoplasm of upper-outer quadrant of left female breast: Secondary | ICD-10-CM | POA: Diagnosis not present

## 2017-05-28 DIAGNOSIS — Z79899 Other long term (current) drug therapy: Secondary | ICD-10-CM | POA: Diagnosis not present

## 2017-05-28 DIAGNOSIS — M8580 Other specified disorders of bone density and structure, unspecified site: Secondary | ICD-10-CM | POA: Diagnosis not present

## 2017-05-28 DIAGNOSIS — Z9221 Personal history of antineoplastic chemotherapy: Secondary | ICD-10-CM | POA: Diagnosis not present

## 2017-05-28 DIAGNOSIS — E785 Hyperlipidemia, unspecified: Secondary | ICD-10-CM | POA: Diagnosis not present

## 2017-05-28 DIAGNOSIS — I471 Supraventricular tachycardia: Secondary | ICD-10-CM | POA: Diagnosis not present

## 2017-05-28 DIAGNOSIS — C7931 Secondary malignant neoplasm of brain: Secondary | ICD-10-CM | POA: Diagnosis not present

## 2017-05-28 DIAGNOSIS — D701 Agranulocytosis secondary to cancer chemotherapy: Secondary | ICD-10-CM

## 2017-05-28 DIAGNOSIS — I10 Essential (primary) hypertension: Secondary | ICD-10-CM | POA: Diagnosis not present

## 2017-05-28 DIAGNOSIS — K7290 Hepatic failure, unspecified without coma: Secondary | ICD-10-CM | POA: Diagnosis not present

## 2017-05-28 DIAGNOSIS — Z86711 Personal history of pulmonary embolism: Secondary | ICD-10-CM | POA: Diagnosis not present

## 2017-05-28 DIAGNOSIS — Z170 Estrogen receptor positive status [ER+]: Secondary | ICD-10-CM | POA: Diagnosis not present

## 2017-05-28 DIAGNOSIS — Z923 Personal history of irradiation: Secondary | ICD-10-CM | POA: Diagnosis not present

## 2017-05-28 DIAGNOSIS — Z79818 Long term (current) use of other agents affecting estrogen receptors and estrogen levels: Secondary | ICD-10-CM | POA: Diagnosis not present

## 2017-05-28 DIAGNOSIS — F17210 Nicotine dependence, cigarettes, uncomplicated: Secondary | ICD-10-CM | POA: Diagnosis not present

## 2017-05-28 DIAGNOSIS — E876 Hypokalemia: Secondary | ICD-10-CM | POA: Diagnosis not present

## 2017-05-28 DIAGNOSIS — T451X5A Adverse effect of antineoplastic and immunosuppressive drugs, initial encounter: Principal | ICD-10-CM

## 2017-05-28 DIAGNOSIS — G629 Polyneuropathy, unspecified: Secondary | ICD-10-CM | POA: Diagnosis not present

## 2017-05-28 MED ORDER — TBO-FILGRASTIM 480 MCG/0.8ML ~~LOC~~ SOSY
480.0000 ug | PREFILLED_SYRINGE | Freq: Once | SUBCUTANEOUS | Status: AC
  Administered 2017-05-28: 480 ug via SUBCUTANEOUS
  Filled 2017-05-28: qty 0.8

## 2017-05-30 ENCOUNTER — Other Ambulatory Visit: Payer: Self-pay | Admitting: Internal Medicine

## 2017-05-30 DIAGNOSIS — C50412 Malignant neoplasm of upper-outer quadrant of left female breast: Secondary | ICD-10-CM

## 2017-05-30 DIAGNOSIS — R748 Abnormal levels of other serum enzymes: Secondary | ICD-10-CM

## 2017-05-30 DIAGNOSIS — G620 Drug-induced polyneuropathy: Secondary | ICD-10-CM

## 2017-05-30 DIAGNOSIS — T451X5A Adverse effect of antineoplastic and immunosuppressive drugs, initial encounter: Secondary | ICD-10-CM

## 2017-05-30 DIAGNOSIS — Z170 Estrogen receptor positive status [ER+]: Principal | ICD-10-CM

## 2017-06-01 ENCOUNTER — Inpatient Hospital Stay: Payer: BLUE CROSS/BLUE SHIELD | Attending: Internal Medicine

## 2017-06-01 ENCOUNTER — Ambulatory Visit: Payer: BLUE CROSS/BLUE SHIELD | Admitting: Occupational Therapy

## 2017-06-01 ENCOUNTER — Other Ambulatory Visit: Payer: Self-pay | Admitting: Internal Medicine

## 2017-06-01 ENCOUNTER — Inpatient Hospital Stay (HOSPITAL_BASED_OUTPATIENT_CLINIC_OR_DEPARTMENT_OTHER): Payer: BLUE CROSS/BLUE SHIELD | Admitting: Internal Medicine

## 2017-06-01 ENCOUNTER — Ambulatory Visit: Payer: BLUE CROSS/BLUE SHIELD | Attending: Internal Medicine

## 2017-06-01 VITALS — BP 96/72 | HR 98 | Temp 98.2°F | Resp 20 | Ht 66.0 in | Wt 137.8 lb

## 2017-06-01 DIAGNOSIS — M6281 Muscle weakness (generalized): Secondary | ICD-10-CM | POA: Insufficient documentation

## 2017-06-01 DIAGNOSIS — Z923 Personal history of irradiation: Secondary | ICD-10-CM | POA: Diagnosis not present

## 2017-06-01 DIAGNOSIS — C7931 Secondary malignant neoplasm of brain: Secondary | ICD-10-CM | POA: Insufficient documentation

## 2017-06-01 DIAGNOSIS — Z170 Estrogen receptor positive status [ER+]: Secondary | ICD-10-CM | POA: Diagnosis not present

## 2017-06-01 DIAGNOSIS — M8580 Other specified disorders of bone density and structure, unspecified site: Secondary | ICD-10-CM | POA: Insufficient documentation

## 2017-06-01 DIAGNOSIS — C50412 Malignant neoplasm of upper-outer quadrant of left female breast: Secondary | ICD-10-CM | POA: Insufficient documentation

## 2017-06-01 DIAGNOSIS — C787 Secondary malignant neoplasm of liver and intrahepatic bile duct: Secondary | ICD-10-CM | POA: Diagnosis not present

## 2017-06-01 DIAGNOSIS — G629 Polyneuropathy, unspecified: Secondary | ICD-10-CM

## 2017-06-01 DIAGNOSIS — T451X5A Adverse effect of antineoplastic and immunosuppressive drugs, initial encounter: Secondary | ICD-10-CM | POA: Diagnosis not present

## 2017-06-01 DIAGNOSIS — F17210 Nicotine dependence, cigarettes, uncomplicated: Secondary | ICD-10-CM | POA: Diagnosis not present

## 2017-06-01 DIAGNOSIS — M79641 Pain in right hand: Secondary | ICD-10-CM | POA: Diagnosis not present

## 2017-06-01 DIAGNOSIS — I252 Old myocardial infarction: Secondary | ICD-10-CM

## 2017-06-01 DIAGNOSIS — I471 Supraventricular tachycardia: Secondary | ICD-10-CM | POA: Insufficient documentation

## 2017-06-01 DIAGNOSIS — I10 Essential (primary) hypertension: Secondary | ICD-10-CM | POA: Insufficient documentation

## 2017-06-01 DIAGNOSIS — G620 Drug-induced polyneuropathy: Secondary | ICD-10-CM | POA: Insufficient documentation

## 2017-06-01 DIAGNOSIS — Z9221 Personal history of antineoplastic chemotherapy: Secondary | ICD-10-CM | POA: Insufficient documentation

## 2017-06-01 DIAGNOSIS — Z808 Family history of malignant neoplasm of other organs or systems: Secondary | ICD-10-CM | POA: Insufficient documentation

## 2017-06-01 DIAGNOSIS — Z86711 Personal history of pulmonary embolism: Secondary | ICD-10-CM | POA: Insufficient documentation

## 2017-06-01 DIAGNOSIS — Z803 Family history of malignant neoplasm of breast: Secondary | ICD-10-CM | POA: Insufficient documentation

## 2017-06-01 DIAGNOSIS — R262 Difficulty in walking, not elsewhere classified: Secondary | ICD-10-CM | POA: Diagnosis not present

## 2017-06-01 DIAGNOSIS — D709 Neutropenia, unspecified: Secondary | ICD-10-CM | POA: Insufficient documentation

## 2017-06-01 DIAGNOSIS — M79642 Pain in left hand: Secondary | ICD-10-CM | POA: Insufficient documentation

## 2017-06-01 DIAGNOSIS — E785 Hyperlipidemia, unspecified: Secondary | ICD-10-CM

## 2017-06-01 DIAGNOSIS — Z79899 Other long term (current) drug therapy: Secondary | ICD-10-CM | POA: Diagnosis not present

## 2017-06-01 LAB — CBC WITH DIFFERENTIAL/PLATELET
BASOS ABS: 0 10*3/uL (ref 0–0.1)
BASOS PCT: 1 %
Eosinophils Absolute: 0.3 10*3/uL (ref 0–0.7)
Eosinophils Relative: 13 %
HEMATOCRIT: 36.9 % (ref 35.0–47.0)
Hemoglobin: 12.1 g/dL (ref 12.0–16.0)
Lymphocytes Relative: 29 %
Lymphs Abs: 0.6 10*3/uL — ABNORMAL LOW (ref 1.0–3.6)
MCH: 30.3 pg (ref 26.0–34.0)
MCHC: 32.8 g/dL (ref 32.0–36.0)
MCV: 92.5 fL (ref 80.0–100.0)
Monocytes Absolute: 0.3 10*3/uL (ref 0.2–0.9)
Monocytes Relative: 16 %
NEUTROS ABS: 0.8 10*3/uL — AB (ref 1.4–6.5)
NEUTROS PCT: 41 %
Platelets: 156 10*3/uL (ref 150–440)
RBC: 3.99 MIL/uL (ref 3.80–5.20)
RDW: 22.4 % — AB (ref 11.5–14.5)
WBC: 2.1 10*3/uL — AB (ref 3.6–11.0)

## 2017-06-01 LAB — COMPREHENSIVE METABOLIC PANEL
ALBUMIN: 3.2 g/dL — AB (ref 3.5–5.0)
ALK PHOS: 219 U/L — AB (ref 38–126)
ALT: 27 U/L (ref 14–54)
AST: 48 U/L — AB (ref 15–41)
Anion gap: 8 (ref 5–15)
BILIRUBIN TOTAL: 0.8 mg/dL (ref 0.3–1.2)
BUN: 14 mg/dL (ref 6–20)
CO2: 27 mmol/L (ref 22–32)
Calcium: 9.1 mg/dL (ref 8.9–10.3)
Chloride: 102 mmol/L (ref 101–111)
Creatinine, Ser: 0.71 mg/dL (ref 0.44–1.00)
GFR calc Af Amer: >60 mL/min (ref >60)
GFR calc non Af Amer: >60 mL/min (ref >60)
GLUCOSE: 77 mg/dL (ref 65–99)
POTASSIUM: 3.9 mmol/L (ref 3.5–5.1)
Sodium: 137 mmol/L (ref 135–145)
TOTAL PROTEIN: 8.4 g/dL — AB (ref 6.5–8.1)

## 2017-06-01 MED ORDER — PREGABALIN 75 MG PO CAPS: 75.0000 mg | ORAL_CAPSULE | Freq: Two times a day (BID) | ORAL | 3 refills | Status: DC

## 2017-06-01 NOTE — Addendum Note (Signed)
Addended by: Betti Cruz on: 06/01/2017 09:55 AM   Modules accepted: Orders

## 2017-06-01 NOTE — Progress Notes (Signed)
Patient here for follow-up for breast cancer. Her performance status has improved. She is now ambulating with a cane.  Patient is tolerating the Abemaciclib well without any complaints. She has not missed any dosages

## 2017-06-01 NOTE — Addendum Note (Signed)
Addended by: Renita Papa R on: 06/01/2017 10:15 AM   Modules accepted: Orders

## 2017-06-01 NOTE — Assessment & Plan Note (Addendum)
#  RECURRENT Metastatic breast cancer- ER/PR positive HER-2/neu negative [status post liver biopsy February 2018; previously HER-2/neu positive]. Currently on palliative Faslodex. Abemaciclib re-started at '50mg'$ /day 2 weeks ago. No evidence of clinical progression. Will order Foundation One today.   # Clinically improving; AUG 6th 2018- CT scan shows improvement of the liver lesions; tumor markers improving. Today white count is 2.3 ANC 800 ; Hb-12; platelets Normal.   # Continue Abemaciclib at '50mg'$ /day.  Neutropenia ANC 800; however asymptomatic. Continue Abema at the current dose.   # Brain mets- s/p RT [finished Feb 28th]; symptomatic improvement. Ordered MRI today/prior to next visit.   # PN G-3 secondary to chemotherapy. Continue Lyrica;new script given. Continue oxycodone once a day. Patient continues to be on physical therapy.  # follow up sep 18th labs-faslodex/ ca-27-29; MRI brain prior. Reviewed with the patient/brother in detail.

## 2017-06-01 NOTE — Therapy (Signed)
[facility] [address], [place], [address] Phone: [phone]   Fax:  [phone]  Physical Therapy Treatment And Progress Report   Patient Details  Name: Sarah Horne MRN: [id number] Date of Birth: 19-Dec-1955 Referring Provider: Charlaine Dalton, MD  Encounter Date: 06/01/2017      PT End of Session - 06/01/17 1301    Visit Number 13   Number of Visits 22   Date for PT Re-Evaluation 07/01/17   PT Start Time 1301   PT Stop Time 9390   PT Time Calculation (min) 46 min   Equipment Utilized During Treatment Gait belt  SPC   Activity Tolerance Patient tolerated treatment well   Behavior During Therapy [facility] for tasks assessed/performed      Past Medical History:  Diagnosis Date  . Chemotherapy-induced peripheral neuropathy ([place])   . Epistaxis    a. 11/2016 in setting of asa/plavix-->silver nitrate cauterization.  . GI bleed    a. 11/2016 Admission w/ GIB and hypovolemic shock req 3u PRBC's;  b. 11/2016 ECG: gastritis & nonbleeding peptic ulcer; c. 11/2016 Conlonoscopy: rectal and sigmoid colonic ulcers.  . Heart attack ([place])    a. 1998 Cath @ [facility]: reportedly no intervention required.  Marland Kitchen Herceptin-induced cardiomyopathy ([place])    a. In the setting of Herceptin Rx for breast cancer (initiated 12/2014); b. 03/2015 MUGA EF 64%; b. 08/2015 MUGA: EF 51%; c. 10/2015 MUGA: EF 44%; d. 11/2015 Echo: EF 45-50%; e. 01/2016 MUGA: EF 60%; f. 06/2016 MUGA EF 65%; g. 10/2016 MUGA: EF 61%;  h. 12/2016 Echo: EF 55-60%, gr1 DD.  Marland Kitchen Hyperlipidemia   . Hypertension   . Neuropathy   . Possible PAD (peripheral artery disease) ([place])    a. 11/2016 LE cyanosis and weak pulses-->CTA w/o significant Ao-BiFem dzs. ? distal dzs-->ASA/Plavix initiated by vascular surgery.  Marland Kitchen PSVT (paroxysmal supraventricular tachycardia) ([place])    a. Dx 11/2016.  . Pulmonary embolism ([place])    a. 12/2016 CTA Chest: small nonocclusive PE in inferior segment of  the Left lingula, somewhat eccentric filling defect suggesting chronic rather than acute embolic event; b. 11/90 LE U/S:  No DVT; c. 12/2016 Echo: Nl RV fxn, nl PASP.  Marland Kitchen Recurrent Metastatic breast cancer ([place])    a. Dx 2016: Stage II, ER positive, PR positive, HER-2/neu overexpressing of the left breast-->chemo/radiation; b. 10/2016 CT Abd/pelvis: diffuse liver mets, ill defined sclerotic bone lesions-T12;  c. 10/2016 MRI brain: metastatic lesion along L temporal lobe (19x46m) w/ extensive surrounding edema & 563mmidline shift to right.  . Sinus tachycardia     Past Surgical History:  Procedure Laterality Date  . BREAST BIOPSY Left 2016   Positive  . BREAST LUMPECTOMY WITH SENTINEL LYMPH NODE BIOPSY Left 05/23/2015   Procedure: LEFT BREAST WIDE EXCISION WITH AXILLARY DISSECTION, MASTOPLASTY ;  Surgeon: JeRobert BellowMD;  Location: [facility] ORS;  Service: General;  Laterality: Left;  . BREAST SURGERY Left 12/18/14   breast biopsy/INVASIVE DUCTAL CARCINOMA OF BREAST, NOTTINGHAM GRADE 2.  . Marland KitchenREAST SURGERY  05/23/2015.   Wide excision/mastoplasty, axillary dissection. No residual invasive cancer, positive for residual DCIS. 0/2 nodes identified on axillary dissection. (no SLN by technetium or methylene blue)  . CARDIAC CATHETERIZATION    . COLONOSCOPY WITH PROPOFOL N/A 12/10/2016   Procedure: COLONOSCOPY WITH PROPOFOL;  Surgeon: DaLucilla LameMD;  Location: [facility] ENDOSCOPY;  Service: Endoscopy;  Laterality: N/A;  . COLONOSCOPY WITH PROPOFOL N/A 02/13/2017   Procedure: COLONOSCOPY  WITH PROPOFOL;  Surgeon: Lucilla Lame, MD;  Location: [facility] ENDOSCOPY;  Service: Endoscopy;  Laterality: N/A;  . ESOPHAGOGASTRODUODENOSCOPY (EGD) WITH PROPOFOL N/A 12/08/2016   Procedure: ESOPHAGOGASTRODUODENOSCOPY (EGD) WITH PROPOFOL;  Surgeon: Lucilla Lame, MD;  Location: [facility] ENDOSCOPY;  Service: Endoscopy;  Laterality: N/A;  . IVC FILTER INSERTION N/A 02/15/2017   Procedure: IVC Filter Insertion;  Surgeon: Algernon Huxley, MD;   Location: [facility];  Service: Cardiovascular;  Laterality: N/A;  . PORTACATH PLACEMENT Right 12-31-14   Dr Bary Castilla    There were no vitals filed for this visit.      Subjective Assessment - 06/01/17 1302    Subjective No pain or discomfort other than her hands and feet. Walking is fine. Still cannot drive because of her R ankle weakness. Also has a tumor in the L side of her brain.     Pertinent History Bilateral UE and LE weakness. Pt states that she feels like her weakness came from her neuropathy. Difficulty writing. Both hands feel numb currently but can feel her hand when she touches it. Has had the weakness for about 3 months (since April 2018) which began gradually.  Went through chemo and radiation last year for about 2.5 months until August 2017.  Still has cancer in her L breast which also traveled to her brain and liver.   Pt also lost 10 lbs since she was diagnosed with cancer 2 years ago.  Apetite is fine. Normally eats breakfast at times. Normally eats one good meal during the day and eats junk in between such as cookies and sodas.  Eats a frozen dinner during lunch. Does not know if she gets enough protein.   Only has pain in her feet and hands bilaterally.  No neck or back pain.   Has been using a rw since May 2018.  Was independent with gait before May 2018.  Pt goal is to improve her function.    Patient Stated Goals I want to be able to get out of the walker and not use a cane. Be able to do all the things she used to do such as cooking a meal, laundry,    Currently in Pain? Other (Comment)  No pain other than hands and feet            [facility] PT Assessment - 06/01/17 1321      Observation/Other Assessments   Observations 6 minute walk without AD 1042 ft without AD, SBA   Lower Extremity Functional Scale  57/80  Measured on 05/20/2017   Quick DASH  25%  Measured on 05/20/2017     AROM   Right Ankle Dorsiflexion -28  48 degrees starting position; -17 deg AAROM,  tight end feel   Left Ankle Dorsiflexion 3     Strength   Overall Strength Comments Strength measured on 05/20/2017   Right Shoulder Flexion 4/5   Right Shoulder ABduction 4+/5   Right Shoulder Internal Rotation 4+/5   Right Shoulder External Rotation 4/5   Left Shoulder Flexion 4/5   Left Shoulder ABduction 4+/5   Left Shoulder Internal Rotation 4+/5   Left Shoulder External Rotation 4/5   Right Elbow Flexion 4/5   Right Elbow Extension 4/5   Left Elbow Flexion 4/5   Left Elbow Extension 4+/5   Right Hip Flexion 4/5   Right Hip ABduction 4/5   Left Hip Flexion 4/5   Left Hip ABduction 4/5   Right Knee Flexion 4/5   Right Knee Extension  4+/5   Left Knee Flexion 4/5   Left Knee Extension 4+/5   Right Ankle Dorsiflexion 2-/5   Right Ankle Plantar Flexion 4+/5  manual resistance   Left Ankle Dorsiflexion 4/5   Left Ankle Plantar Flexion 4+/5  manual resistance     Berg Balance Test   Sit to Stand Able to stand  independently using hands   Standing Unsupported Able to stand safely 2 minutes   Sitting with Back Unsupported but Feet Supported on Floor or Stool Able to sit safely and securely 2 minutes   Stand to Sit Controls descent by using hands   Transfers Able to transfer safely, definite need of hands   Standing Unsupported with Eyes Closed Able to stand 10 seconds with supervision   Standing Ubsupported with Feet Together Able to place feet together independently and stand for 1 minute with supervision   From Standing, Reach Forward with Outstretched Arm Can reach forward >12 cm safely (5")   From Standing Position, Pick up Object from Floor Unable to pick up shoe, but reaches 2-5 cm (1-2") from shoe and balances independently   From Standing Position, Turn to Look Behind Over each Shoulder Turn sideways only but maintains balance   Turn 360 Degrees Able to turn 360 degrees safely but slowly   Standing Unsupported, Alternately Place Feet on Step/Stool Able to stand  independently and safely and complete 8 steps in 20 seconds   Standing Unsupported, One Foot in Front Able to plae foot ahead of the other independently and hold 30 seconds   Standing on One Leg Able to lift leg independently and hold 5-10 seconds   Total Score 42   Berg comment: > 46/56 suggests decreased fall risk   Measured on 05/25/2017                             PT Education - 06/01/17 1305    Education provided Yes   Education Details ther-ex   [employer]) Educated Patient   Methods Explanation;Demonstration;Tactile cues;Verbal cues   Comprehension Returned demonstration;Verbalized understanding        Objectives   There-ex  Blood pressure R arm sitting, mechanically taken: 113/75, HR 87 SpO2 100% room Air  Standing up from a chair, walking 10 ft forward, then returning 10 ft, then sitting back onto chair 3x No AD: 11 seconds, 9 seconds, 10 seconds (10 seconds on average)  Gait x 6 min without using AD but holding on to her SPC. Min cues for increasing R hip flexion secondary to R foot drop  1042 ft, SBA, no LOB  Seated R ankle DF with PT assist to end range10x  Reviewed plan of care: continue 2x/week for 4 weeks to help improve R ankle DF AROM  Supine R ankle AAROM 1x  Standing gastroc stretch with L UE assist on stair rail 30 seconds x 3  Standing bilateral ankle DF/PF on rocker board 2 min.   Forward and backward heel walking with L UE assist on treadmill bars 5 ft forward, 5 ft back 4x to promote R ankle DF.   Improved exercise technique, movement at target joints, use of target muscles after min to mod verbal, visual, tactile cues.    Pt demonstrates improved overall UE and LE strength, function, balance, and ability to ambulate without use of AD. Pt making very good progress with PT towards goals. Pt still demonstrates R ankle DF weakness, gastroc muscle tightness, bilateral UE  and LE weakness, decreased balance and difficulty  performing functional tasks. Pt will benefit from continued skilled PT services to address the aforementioned deficits.           PT Long Term Goals - 06/01/17 1400      PT LONG TERM GOAL #1   Title Patient will be able to perform her TUG time using a rw to 18 seconds or less to promote functional mobility.   Baseline 25.3 seconds average with rw (04/20/2017); 12 seconds on average with SPC (05/20/2017)   Time 6   Period Weeks   Status Achieved     PT LONG TERM GOAL #2   Title Patient will improve bilateral UE and bilateral hip, and knee strength to promote ability to perform functional tasks at home.    Time 4   Period Weeks   Status On-going   Target Date 07/01/17     PT LONG TERM GOAL #3   Title Patient will improve her LEFS score by at least 9 points as a demonstration of improved function.    Baseline 25/80 (04/20/2017); 57/80 (05/20/2017)   Time 6   Period Weeks   Status Achieved     PT LONG TERM GOAL #4   Title Patient will improve her Quick Dash Disability/Symptom score by at least 15% as a demonstration of improved function.    Baseline 70.45% (04/20/2017); 25% (05/20/2017)   Time 6   Period Weeks   Status Achieved     PT LONG TERM GOAL #5   Title Patient will be able to perform her TUG time without AD to 12 seconds or less to promote functional mobility.   Baseline 12 seconds with SPC (05/20/2017); 10 seconds average without AD (06/01/2017)   Time 4   Period Weeks   Status Achieved     Additional Long Term Goals   Additional Long Term Goals Yes     PT LONG TERM GOAL #6   Title Pt will be able to ambulate at least 6 minutes without AD with SBA and no LOB to promote mobility and function.    Baseline able to ambulate 400 ft without AD with CGA, no LOB (05/20/2017); able to ambulate 1042 ft in 6 min without AD and with SBA, no LOB (06/01/2017)    Time 4   Period Weeks   Status Achieved     PT LONG TERM GOAL #7   Title Pt will improve supine R ankle DF AROM to - 17  degrees or less to promote foot clearance during gait.     Baseline -28 degrees (06/01/2017)   Time 4   Period Weeks   Status New   Target Date 07/01/17     PT LONG TERM GOAL #8   Title Pt will improve her BERG balance score to 47/56 or more as a demonstration of improved balance.    Baseline 42/56 (05/25/2017)   Time 4   Period Days   Status New   Target Date 07/01/17               Plan - 06/01/17 1322    Clinical Impression Statement Pt demonstrates improved overall UE and LE strength, function, balance, and ability to ambulate without use of AD. Pt making very good progress with PT towards goals. Pt still demonstrates R ankle DF weakness, gastroc muscle tightness, bilateral UE and LE weakness, decreased balance and difficulty performing functional tasks. Pt will benefit from continued skilled PT services to address the aforementioned deficits.  History and Personal Factors relevant to plan of care: hx of CA, weakness, multiple health problems   Clinical Presentation Stable   Clinical Presentation due to: Able to ambulate 6 min without AD and no LOB, improved TUG time and without use of AD.    Clinical Decision Making Low   Rehab Potential Fair   Clinical Impairments Affecting Rehab Potential hx of CA, multiple health problems   PT Frequency 2x / week   PT Duration 6 weeks   PT Treatment/Interventions Manual techniques;Patient/family education;Therapeutic activities;Therapeutic exercise;Gait training;Neuromuscular re-education;Balance training   PT Next Visit Plan hip, scapular, LE, UE strengthening, gait, balance, femoral control   Consulted and Agree with Plan of Care Patient      Patient will benefit from skilled therapeutic intervention in order to improve the following deficits and impairments:  Pain, Improper body mechanics, Postural dysfunction, Impaired UE functional use, Decreased strength, Difficulty walking, Decreased endurance  Visit Diagnosis: Muscle weakness  (generalized) - Plan: PT plan of care cert/re-cert  Difficulty in walking, not elsewhere classified - Plan: PT plan of care cert/re-cert     Problem List Patient Active Problem List   Diagnosis Date Noted  . Brain metastasis ([place]) 05/19/2017  . Blood in stool   . Polyp of sigmoid colon   . Benign neoplasm of transverse colon   . Benign neoplasm of ascending colon   . Lower GI bleed   . Weakness of right lower extremity 02/11/2017  . Orthostatic lightheadedness 01/14/2017  . Atrial tachycardia ([place]) 01/14/2017  . Hypokalemia 01/14/2017  . Pulmonary emboli ([place]) 01/12/2017  . Dehydration 12/25/2016  . Blood loss anemia 12/15/2016  . Ulceration of colon   . Heartburn   . Duodenitis   . Gastritis without bleeding   . GI bleed   . Acute esophagitis   . Acute esophagogastric ulcer   . Rectal bleed   . Hypovolemic shock ([place])   . Ischemic leg 12/04/2016  . Chemotherapy induced neutropenia ([place]) 11/20/2016  . Palliative care by specialist   . Goals of care, counseling/discussion   . DNR (do not resuscitate) discussion   . Cerebral edema ([place]) 11/10/2016  . Encounter for monitoring cardiotoxic drug therapy 11/09/2016  . Elevated LFTs 10/20/2016  . URI with cough and congestion 08/25/2016  . Peripheral neuropathy due to chemotherapy ([place]) 03/13/2016  . Encounter for antineoplastic chemotherapy 03/13/2016  . HLD (hyperlipidemia) 01/29/2016  . Hyperglycemia, unspecified 01/29/2016  . Cardiomyopathy ([place]) 01/13/2016  . Hypertension 07/18/2015  . Carcinoma of upper-outer quadrant of left breast in female, estrogen receptor positive ([place]) 12/27/2014    Joneen Boers PT, DPT  06/01/2017, 2:20 PM  [facility] [address] [place], [address] Phone: [phone]   Fax:  [phone]  Name: Sarah Horne MRN: [id number] Date of Birth: [birth year]

## 2017-06-01 NOTE — Progress Notes (Signed)
[place] OFFICE PROGRESS NOTE  Patient Care Team: [name], MD as PCP - General (Family Medicine) Bary Castilla, Forest Gleason, MD (General Surgery) [name], MD (Inactive) as Attending Physician (Internal Medicine)  No matching staging information was found for the patient.   Oncology History   # LEFT BREAST IDC; STAGE II [cT2N1] ER >90%; PR- 50-90%; her 2 NEU- POS; s/p Neoadj chemo; AUG 2016- TCH+P s/p Lumpec & partial ALND- path CR; s/p RT [finished Nov 2016];  adj Herceptin; HELD for Jan 19th 2017 [in DEC 2016-EF dropped from 63 to 51%; FEB 27th EF-42.9%]; JAN 2016 START Arimidex; May 2017- EF- 60%; May 24th 2017-Re-start Herceptin q 3W; July 28th STOP herceptin [finished 18m/m2; sec to Low EF; however 2017 Oct EF= 67%; improved]  # DEC 4th 2017- Start Neratinib 4 pills; DEC 11th 3 pills; STOPPED.  # FEB 2018- RECURRENCE ER/PR positive; Her 2 NEGATIVE [liver Bx]  # MARCH 1st 2018- Tax-Cytoxan [poor tol]  # FEB 2018- Brain mets [SBRT; Dr.Crystal; finished Feb 28th 2018]  # March 2018- Eribulin- poor tolerance  # June 8th 2018- Started Faslodex + Abema [abema-multiple interruptions sec to neutropenia]  # PN- G-2; may 2017- Cymbalta 631md;MARCH 2018-  acute vascular insuff of BIL LE [? Taxotere vasoconstriction on asprin]; # hemorragic shock LGIB [s/p colo; Dr.Wohl]    # April 2016- Liver Bx- NEG;   # Drop in EF from Herceptin [recovered OCt 27th- EF 65%]  # BMD- jan 2017- osteopenia  -----------------------------------------------------------     MOLECULAR TESTING- Foundation One- Sep 4th 2018- ordered.      Carcinoma of upper-outer quadrant of left breast in female, estrogen receptor positive ([place]    INTERVAL HISTORY:  A very pleasant 89ear old female patient with above history of Recurrent stage IV ER/PR positive HER-2/neu NEGATIVE [status post liver biopsy February 2018] - is currently on Faslodex-Abemaciclib [ambema  At 5072may] is  here for follow-up.  Patient continues to improve overall. Her appetite is improving. She is gaining weight. She is more up and about at home. She is currently walking with a cane. She is finishing up with physical therapy this week.  No falls. No headaches. Patient has not had any recent hospitalizations. No fevers or chills. Overall patient had a good labor day weekend with the family.  REVIEW OF SYSTEMS:  A complete 10 point review of system is done which is negative except mentioned above/history of present illness.   PAST MEDICAL HISTORY :  Past Medical History:  Diagnosis Date  . Chemotherapy-induced peripheral neuropathy ([place] . Epistaxis    a. 11/2016 in setting of asa/plavix-->silver nitrate cauterization.  . GI bleed    a. 11/2016 Admission w/ GIB and hypovolemic shock req 3u PRBC's;  b. 11/2016 ECG: gastritis & nonbleeding peptic ulcer; c. 11/2016 Conlonoscopy: rectal and sigmoid colonic ulcers.  . Heart attack ([place]  a. 1998 Cath @ [facility]: reportedly no intervention required.  . HMarland Kitchenrceptin-induced cardiomyopathy ([place]  a. In the setting of Herceptin Rx for breast cancer (initiated 12/2014); b. 03/2015 MUGA EF 64%; b. 08/2015 MUGA: EF 51%; c. 10/2015 MUGA: EF 44%; d. 11/2015 Echo: EF 45-50%; e. 01/2016 MUGA: EF 60%; f. 06/2016 MUGA EF 65%; g. 10/2016 MUGA: EF 61%;  h. 12/2016 Echo: EF 55-60%, gr1 DD.  . HMarland Kitchenperlipidemia   . Hypertension   . Neuropathy   . Possible PAD (peripheral artery disease) ([place]  a. 11/2016 LE cyanosis and weak  pulses-->CTA w/o significant Ao-BiFem dzs. ? distal dzs-->ASA/Plavix initiated by vascular surgery.  Marland Kitchen PSVT (paroxysmal supraventricular tachycardia) ([place])    a. Dx 11/2016.  . Pulmonary embolism ([place])    a. 12/2016 CTA Chest: small nonocclusive PE in inferior segment of the Left lingula, somewhat eccentric filling defect suggesting chronic rather than acute embolic event; b. 01/9934 LE U/S:  No DVT; c. 12/2016 Echo: Nl RV fxn, nl PASP.  Marland Kitchen Recurrent Metastatic  breast cancer ([place])    a. Dx 2016: Stage II, ER positive, PR positive, HER-2/neu overexpressing of the left breast-->chemo/radiation; b. 10/2016 CT Abd/pelvis: diffuse liver mets, ill defined sclerotic bone lesions-T12;  c. 10/2016 MRI brain: metastatic lesion along L temporal lobe (19x21m) w/ extensive surrounding edema & 551mmidline shift to right.  . Sinus tachycardia     PAST SURGICAL HISTORY :   Past Surgical History:  Procedure Laterality Date  . BREAST BIOPSY Left 2016   Positive  . BREAST LUMPECTOMY WITH SENTINEL LYMPH NODE BIOPSY Left 05/23/2015   Procedure: LEFT BREAST WIDE EXCISION WITH AXILLARY DISSECTION, MASTOPLASTY ;  Surgeon: JeRobert BellowMD;  Location: [facility] ORS;  Service: General;  Laterality: Left;  . BREAST SURGERY Left 12/18/14   breast biopsy/INVASIVE DUCTAL CARCINOMA OF BREAST, NOTTINGHAM GRADE 2.  . Marland KitchenREAST SURGERY  05/23/2015.   Wide excision/mastoplasty, axillary dissection. No residual invasive cancer, positive for residual DCIS. 0/2 nodes identified on axillary dissection. (no SLN by technetium or methylene blue)  . CARDIAC CATHETERIZATION    . COLONOSCOPY WITH PROPOFOL N/A 12/10/2016   Procedure: COLONOSCOPY WITH PROPOFOL;  Surgeon: DaLucilla LameMD;  Location: [facility] ENDOSCOPY;  Service: Endoscopy;  Laterality: N/A;  . COLONOSCOPY WITH PROPOFOL N/A 02/13/2017   Procedure: COLONOSCOPY WITH PROPOFOL;  Surgeon: WoLucilla LameMD;  [facility];  Service: Endoscopy;  Laterality: N/A;  . ESOPHAGOGASTRODUODENOSCOPY (EGD) WITH PROPOFOL N/A 12/08/2016   Procedure: ESOPHAGOGASTRODUODENOSCOPY (EGD) WITH PROPOFOL;  Surgeon: DaLucilla LameMD;  Location: [facility] ENDOSCOPY;  Service: Endoscopy;  Laterality: N/A;  . IVC FILTER INSERTION N/A 02/15/2017   Procedure: IVC Filter Insertion;  Surgeon: DeAlgernon HuxleyMD;  [facility];  Service: Cardiovascular;  Laterality: N/A;  . PORTACATH PLACEMENT Right 12-31-14   Dr ByBary Castilla  FAMILY HISTORY :   Family  History  Problem Relation Age of Onset  . Breast cancer Maternal Aunt   . Breast cancer Cousin   . Brain cancer Maternal Uncle   . Diabetes Mother   . Hypertension Mother   . Stroke Mother     SOCIAL HISTORY:   Social History  Substance Use Topics  . Smoking status: Light Tobacco Smoker    Packs/day: 0.50    Years: 18.00    Types: Cigarettes  . Smokeless tobacco: Never Used  . Alcohol use No    ALLERGIES:  is allergic to no known allergies.  MEDICATIONS:  Current Outpatient Prescriptions  Medication Sig Dispense Refill  . Abemaciclib 50 MG TABS Take 1 tablet by mouth daily. DO NOT START Until seen by MD. 30 tablet 3  . anastrozole (ARIMIDEX) 1 MG tablet Take 1 tablet (1 mg total) by mouth daily. 90 tablet 3  . feeding supplement, ENSURE ENLIVE, (ENSURE ENLIVE) LIQD Take 237 mLs by mouth 2 (two) times daily between meals. 237 mL 12  . flecainide (TAMBOCOR) 50 MG tablet TAKE 1 TABLET BY MOUTH TWICE A DAY 60 tablet 1  . magnesium oxide (MAG-OX) 400 MG tablet Take 1 tablet (400 mg  total) by mouth 2 (two) times daily. 60 tablet 6  . oxyCODONE (OXY IR/ROXICODONE) 5 MG immediate release tablet Take 1 tablet (5 mg total) by mouth every 8 (eight) hours as needed for severe pain. 42 tablet 0  . potassium chloride SA (K-DUR,KLOR-CON) 20 MEQ tablet Take 1 tablet (20 mEq total) by mouth daily. 90 tablet 3  . pregabalin (LYRICA) 75 MG capsule Take 1 capsule (75 mg total) by mouth 2 (two) times daily. 60 capsule 3  . diphenoxylate-atropine (LOMOTIL) 2.5-0.025 MG tablet Take 1 tablet by mouth 4 (four) times daily as needed for diarrhea or loose stools. Take it along with immodium (Patient not taking: Reported on 05/19/2017) 60 tablet 0  . ondansetron (ZOFRAN) 8 MG tablet Take 1 tablet (8 mg total) by mouth every 8 (eight) hours as needed for nausea or vomiting (start 3 days; after chemo). (Patient not taking: Reported on 05/19/2017) 40 tablet 1   No current facility-administered medications for  this visit.    Facility-Administered Medications Ordered in Other Visits  Medication Dose Route Frequency Provider Last Rate Last Dose  . 0.9 %  sodium chloride infusion   Intravenous Continuous Pandit, Sandeep, MD      . 0.9 %  sodium chloride infusion   Intravenous Continuous Lloyd Huger, MD 999 mL/hr at 04/24/15 1520    . heparin lock flush 100 unit/mL  500 Units Intravenous Once Berenzon, Dmitriy, MD      . sodium chloride 0.9 % injection 10 mL  10 mL Intravenous PRN [name], MD   10 mL at 04/04/15 1440  . sodium chloride flush (NS) 0.9 % injection 10 mL  10 mL Intravenous PRN Berenzon, Dmitriy, MD      . Tbo-Filgrastim [facility]) injection 480 mcg  480 mcg Subcutaneous Once Cammie Sickle, MD        PHYSICAL EXAMINATION: ECOG PERFORMANCE STATUS: 0 - Asymptomatic  BP 96/72 (BP Location: Left Arm, Patient Position: Sitting)   Pulse 98   Temp 98.2 F (36.8 C) (Oral)   Resp 20   Ht 5' 6"  (1.676 m)   Wt 137 lb 12.6 oz (62.5 kg)   BMI 22.24 kg/m   Filed Weights   06/01/17 1106  Weight: 137 lb 12.6 oz (62.5 kg)    GENERAL: Well-nourished well-developed; Alert, no distress and comfortable. Accompanied by her brother.  She is walking with a cane. EYES: no pallor. No jaundice. She is in a wheelchair  OROPHARYNX: No  ulceration; good dentition ; NECK: supple, no masses felt LYMPH:  no palpable lymphadenopathy in the cervical, axillary or inguinal regions LUNGS: clear to auscultation and  No wheeze or crackles HEART/CVS: regular rate & rhythm and no murmurs; NO bilateral lower extremity edema.  ABDOMEN:abdomen soft, non-tender and normal bowel sounds Musculoskeletal:no cyanosis of digits and no clubbing  PSYCH: alert & oriented x 3 with fluent speech NEURO: no focal motor/sensory deficits- except for foot drop on the right side.   LABORATORY DATA:  I have reviewed the data as listed    Component Value Date/Time   NA 137 06/01/2017 1047   NA 135 03/04/2017  0918   NA 135 01/08/2015 0850   K 3.9 06/01/2017 1047   K 3.7 01/08/2015 0850   CL 102 06/01/2017 1047   CL 101 01/08/2015 0850   CO2 27 06/01/2017 1047   CO2 26 01/08/2015 0850   GLUCOSE 77 06/01/2017 1047   GLUCOSE 161 (H) 01/08/2015 0850   BUN 14 06/01/2017 1047  BUN 8 03/04/2017 0918   BUN 17 01/08/2015 0850   CREATININE 0.71 06/01/2017 1047   CREATININE 0.82 01/22/2015 1559   CALCIUM 9.1 06/01/2017 1047   CALCIUM 9.3 01/08/2015 0850   PROT 8.4 (H) 06/01/2017 1047   PROT 6.4 03/04/2017 0918   PROT 6.8 01/22/2015 1559   ALBUMIN 3.2 (L) 06/01/2017 1047   ALBUMIN 2.5 (L) 03/04/2017 0918   ALBUMIN 3.9 01/22/2015 1559   AST 48 (H) 06/01/2017 1047   AST 34 01/22/2015 1559   ALT 27 06/01/2017 1047   ALT 42 01/22/2015 1559   ALKPHOS 219 (H) 06/01/2017 1047   ALKPHOS 157 (H) 01/22/2015 1559   BILITOT 0.8 06/01/2017 1047   BILITOT 6.2 (H) 03/04/2017 0918   BILITOT 0.2 (L) 01/22/2015 1559   GFRNONAA >60 06/01/2017 1047   GFRNONAA >60 01/22/2015 1559   GFRAA >60 06/01/2017 1047   GFRAA >60 01/22/2015 1559    No results found for: SPEP, UPEP  Lab Results  Component Value Date   WBC 2.1 (L) 06/01/2017   NEUTROABS 0.8 (L) 06/01/2017   HGB 12.1 06/01/2017   HCT 36.9 06/01/2017   MCV 92.5 06/01/2017   PLT 156 06/01/2017      Chemistry      Component Value Date/Time   NA 137 06/01/2017 1047   NA 135 03/04/2017 0918   NA 135 01/08/2015 0850   K 3.9 06/01/2017 1047   K 3.7 01/08/2015 0850   CL 102 06/01/2017 1047   CL 101 01/08/2015 0850   CO2 27 06/01/2017 1047   CO2 26 01/08/2015 0850   BUN 14 06/01/2017 1047   BUN 8 03/04/2017 0918   BUN 17 01/08/2015 0850   CREATININE 0.71 06/01/2017 1047   CREATININE 0.82 01/22/2015 1559      Component Value Date/Time   CALCIUM 9.1 06/01/2017 1047   CALCIUM 9.3 01/08/2015 0850   ALKPHOS 219 (H) 06/01/2017 1047   ALKPHOS 157 (H) 01/22/2015 1559   AST 48 (H) 06/01/2017 1047   AST 34 01/22/2015 1559   ALT 27 06/01/2017  1047   ALT 42 01/22/2015 1559   BILITOT 0.8 06/01/2017 1047   BILITOT 6.2 (H) 03/04/2017 0918   BILITOT 0.2 (L) 01/22/2015 1559            LABORATORY DATA:  I have reviewed the data as listed    Component Value Date/Time   NA 137 06/01/2017 1047   NA 135 03/04/2017 0918   NA 135 01/08/2015 0850   K 3.9 06/01/2017 1047   K 3.7 01/08/2015 0850   CL 102 06/01/2017 1047   CL 101 01/08/2015 0850   CO2 27 06/01/2017 1047   CO2 26 01/08/2015 0850   GLUCOSE 77 06/01/2017 1047   GLUCOSE 161 (H) 01/08/2015 0850   BUN 14 06/01/2017 1047   BUN 8 03/04/2017 0918   BUN 17 01/08/2015 0850   CREATININE 0.71 06/01/2017 1047   CREATININE 0.82 01/22/2015 1559   CALCIUM 9.1 06/01/2017 1047   CALCIUM 9.3 01/08/2015 0850   PROT 8.4 (H) 06/01/2017 1047   PROT 6.4 03/04/2017 0918   PROT 6.8 01/22/2015 1559   ALBUMIN 3.2 (L) 06/01/2017 1047   ALBUMIN 2.5 (L) 03/04/2017 0918   ALBUMIN 3.9 01/22/2015 1559   AST 48 (H) 06/01/2017 1047   AST 34 01/22/2015 1559   ALT 27 06/01/2017 1047   ALT 42 01/22/2015 1559   ALKPHOS 219 (H) 06/01/2017 1047   ALKPHOS 157 (H) 01/22/2015 1559   BILITOT 0.8  06/01/2017 1047   BILITOT 6.2 (H) 03/04/2017 0918   BILITOT 0.2 (L) 01/22/2015 1559   GFRNONAA >60 06/01/2017 1047   GFRNONAA >60 01/22/2015 1559   GFRAA >60 06/01/2017 1047   GFRAA >60 01/22/2015 1559    No results found for: SPEP, UPEP  Lab Results  Component Value Date   WBC 2.1 (L) 06/01/2017   NEUTROABS 0.8 (L) 06/01/2017   HGB 12.1 06/01/2017   HCT 36.9 06/01/2017   MCV 92.5 06/01/2017   PLT 156 06/01/2017      Chemistry      Component Value Date/Time   NA 137 06/01/2017 1047   NA 135 03/04/2017 0918   NA 135 01/08/2015 0850   K 3.9 06/01/2017 1047   K 3.7 01/08/2015 0850   CL 102 06/01/2017 1047   CL 101 01/08/2015 0850   CO2 27 06/01/2017 1047   CO2 26 01/08/2015 0850   BUN 14 06/01/2017 1047   BUN 8 03/04/2017 0918   BUN 17 01/08/2015 0850   CREATININE 0.71 06/01/2017  1047   CREATININE 0.82 01/22/2015 1559      Component Value Date/Time   CALCIUM 9.1 06/01/2017 1047   CALCIUM 9.3 01/08/2015 0850   ALKPHOS 219 (H) 06/01/2017 1047   ALKPHOS 157 (H) 01/22/2015 1559   AST 48 (H) 06/01/2017 1047   AST 34 01/22/2015 1559   ALT 27 06/01/2017 1047   ALT 42 01/22/2015 1559   BILITOT 0.8 06/01/2017 1047   BILITOT 6.2 (H) 03/04/2017 0918   BILITOT 0.2 (L) 01/22/2015 1559     IMPRESSION: Normal left ventricular wall motion with estimated ejection fraction of 65%.   Electronically Signed   By: Marijo Sanes M.D.   On: 07/27/2016 09:04  RADIOGRAPHIC STUDIES: I have personally reviewed the radiological images as listed and agreed with the findings in the report. No results found.   ASSESSMENT & PLAN:  Carcinoma of upper-outer quadrant of left breast in female, estrogen receptor positive ([place]) # RECURRENT Metastatic breast cancer- ER/PR positive HER-2/neu negative [status post liver biopsy February 2018; previously HER-2/neu positive]. Currently on palliative Faslodex. Abemaciclib re-started at 41m/day 2 weeks ago. No evidence of clinical progression. Will order Foundation One today.   # Clinically improving; AUG 6th 2018- CT scan shows improvement of the liver lesions; tumor markers improving. Today white count is 2.3 ANC 800 ; Hb-12; platelets Normal.   # Continue Abemaciclib at 557mday.  Neutropenia ANC 800; however asymptomatic. Continue Abema at the current dose.   # Brain mets- s/p RT [finished Feb 28th]; symptomatic improvement. Ordered MRI today/prior to next visit.   # PN G-3 secondary to chemotherapy. Continue Lyrica;new script given. Continue oxycodone once a day. Patient continues to be on physical therapy.  # follow up sep 18th labs-faslodex/ ca-27-29; MRI brain prior. Reviewed with the patient/brother in detail.  Orders Placed This Encounter  Procedures  . MR Brain W Wo Contrast    Standing Status:   Future    Standing Expiration  Date:   06/01/2018    Order Specific Question:   If indicated for the ordered procedure, I authorize the administration of contrast media per Radiology protocol    Answer:   Yes    Order Specific Question:   What is the patient's sedation requirement?    Answer:   No Sedation    Order Specific Question:   Does the patient have a pacemaker or implanted devices?    Answer:   No    Order  Specific Question:   Radiology Contrast Protocol - do NOT remove file path    Answer:   [email or web address]    Order Specific Question:   Preferred imaging location?    Answer:   [facility] (table limit-350lbs)  . CBC with Differential    Standing Status:   Future    Standing Expiration Date:   06/01/2018  . Comprehensive metabolic panel    Standing Status:   Future    Standing Expiration Date:   06/01/2018  . Cancer antigen 27.29    Standing Status:   Future    Standing Expiration Date:   06/01/2018   All questions were answered. The patient knows to call the clinic with any problems, questions or concerns.      Cammie Sickle, MD 06/01/2017 12:17 PM

## 2017-06-03 ENCOUNTER — Ambulatory Visit: Payer: BLUE CROSS/BLUE SHIELD

## 2017-06-03 ENCOUNTER — Ambulatory Visit: Payer: BLUE CROSS/BLUE SHIELD | Admitting: Occupational Therapy

## 2017-06-03 DIAGNOSIS — T451X5A Adverse effect of antineoplastic and immunosuppressive drugs, initial encounter: Secondary | ICD-10-CM | POA: Diagnosis not present

## 2017-06-03 DIAGNOSIS — M79641 Pain in right hand: Secondary | ICD-10-CM

## 2017-06-03 DIAGNOSIS — M6281 Muscle weakness (generalized): Secondary | ICD-10-CM | POA: Diagnosis not present

## 2017-06-03 DIAGNOSIS — R262 Difficulty in walking, not elsewhere classified: Secondary | ICD-10-CM

## 2017-06-03 DIAGNOSIS — M79642 Pain in left hand: Secondary | ICD-10-CM

## 2017-06-03 DIAGNOSIS — G620 Drug-induced polyneuropathy: Secondary | ICD-10-CM | POA: Diagnosis not present

## 2017-06-03 NOTE — Therapy (Signed)
[facility] [address], [place], [address] Phone: [phone]   Fax:  [phone]  Physical Therapy Treatment  Patient Details  Name: Sarah Horne MRN: [id number] Date of Birth: March 20, 1956 Referring Provider: Charlaine Dalton, MD  Encounter Date: 06/03/2017      PT End of Session - 06/03/17 1302    Visit Number 14   Number of Visits 22   Date for PT Re-Evaluation 07/01/17   PT Start Time 1302   PT Stop Time 1342   PT Time Calculation (min) 40 min   Equipment Utilized During Treatment Gait belt  SPC   Activity Tolerance Patient tolerated treatment well   Behavior During Therapy [facility] for tasks assessed/performed      Past Medical History:  Diagnosis Date  . Chemotherapy-induced peripheral neuropathy ([place])   . Epistaxis    a. 11/2016 in setting of asa/plavix-->silver nitrate cauterization.  . GI bleed    a. 11/2016 Admission w/ GIB and hypovolemic shock req 3u PRBC's;  b. 11/2016 ECG: gastritis & nonbleeding peptic ulcer; c. 11/2016 Conlonoscopy: rectal and sigmoid colonic ulcers.  . Heart attack ([place])    a. 1998 Cath @ [facility]: reportedly no intervention required.  Marland Kitchen Herceptin-induced cardiomyopathy ([place])    a. In the setting of Herceptin Rx for breast cancer (initiated 12/2014); b. 03/2015 MUGA EF 64%; b. 08/2015 MUGA: EF 51%; c. 10/2015 MUGA: EF 44%; d. 11/2015 Echo: EF 45-50%; e. 01/2016 MUGA: EF 60%; f. 06/2016 MUGA EF 65%; g. 10/2016 MUGA: EF 61%;  h. 12/2016 Echo: EF 55-60%, gr1 DD.  Marland Kitchen Hyperlipidemia   . Hypertension   . Neuropathy   . Possible PAD (peripheral artery disease) ([place])    a. 11/2016 LE cyanosis and weak pulses-->CTA w/o significant Ao-BiFem dzs. ? distal dzs-->ASA/Plavix initiated by vascular surgery.  Marland Kitchen PSVT (paroxysmal supraventricular tachycardia) ([place])    a. Dx 11/2016.  . Pulmonary embolism ([place])    a. 12/2016 CTA Chest: small nonocclusive PE in inferior segment of the Left lingula,  somewhat eccentric filling defect suggesting chronic rather than acute embolic event; b. 11/3610 LE U/S:  No DVT; c. 12/2016 Echo: Nl RV fxn, nl PASP.  Marland Kitchen Recurrent Metastatic breast cancer ([place])    a. Dx 2016: Stage II, ER positive, PR positive, HER-2/neu overexpressing of the left breast-->chemo/radiation; b. 10/2016 CT Abd/pelvis: diffuse liver mets, ill defined sclerotic bone lesions-T12;  c. 10/2016 MRI brain: metastatic lesion along L temporal lobe (19x63m) w/ extensive surrounding edema & 532mmidline shift to right.  . Sinus tachycardia     Past Surgical History:  Procedure Laterality Date  . BREAST BIOPSY Left 2016   Positive  . BREAST LUMPECTOMY WITH SENTINEL LYMPH NODE BIOPSY Left 05/23/2015   Procedure: LEFT BREAST WIDE EXCISION WITH AXILLARY DISSECTION, MASTOPLASTY ;  Surgeon: JeRobert BellowMD;  Location: [facility] ORS;  Service: General;  Laterality: Left;  . BREAST SURGERY Left 12/18/14   breast biopsy/INVASIVE DUCTAL CARCINOMA OF BREAST, NOTTINGHAM GRADE 2.  . Marland KitchenREAST SURGERY  05/23/2015.   Wide excision/mastoplasty, axillary dissection. No residual invasive cancer, positive for residual DCIS. 0/2 nodes identified on axillary dissection. (no SLN by technetium or methylene blue)  . CARDIAC CATHETERIZATION    . COLONOSCOPY WITH PROPOFOL N/A 12/10/2016   Procedure: COLONOSCOPY WITH PROPOFOL;  Surgeon: DaLucilla LameMD;  Location: [facility] ENDOSCOPY;  Service: Endoscopy;  Laterality: N/A;  . COLONOSCOPY WITH PROPOFOL N/A 02/13/2017   Procedure: COLONOSCOPY WITH PROPOFOL;  Surgeon:  Lucilla Lame, MD;  Location: [facility];  Service: Endoscopy;  Laterality: N/A;  . ESOPHAGOGASTRODUODENOSCOPY (EGD) WITH PROPOFOL N/A 12/08/2016   Procedure: ESOPHAGOGASTRODUODENOSCOPY (EGD) WITH PROPOFOL;  Surgeon: Lucilla Lame, MD;  Location: [facility] ENDOSCOPY;  Service: Endoscopy;  Laterality: N/A;  . IVC FILTER INSERTION N/A 02/15/2017   Procedure: IVC Filter Insertion;  Surgeon: Algernon Huxley, MD;  Location: [facility];  Service: Cardiovascular;  Laterality: N/A;  . PORTACATH PLACEMENT Right 12-31-14   Dr Bary Castilla    There were no vitals filed for this visit.      Subjective Assessment - 06/03/17 1306    Subjective Pt states eating 2 strawberry buscuits 10:30 am. No pain or discomfort other than the hands and feet. Might be getting a sinus infection.  Walking is fine.    Pertinent History Bilateral UE and LE weakness. Pt states that she feels like her weakness came from her neuropathy. Difficulty writing. Both hands feel numb currently but can feel her hand when she touches it. Has had the weakness for about 3 months (since April 2018) which began gradually.  Went through chemo and radiation last year for about 2.5 months until August 2017.  Still has cancer in her L breast which also traveled to her brain and liver.   Pt also lost 10 lbs since she was diagnosed with cancer 2 years ago.  Apetite is fine. Normally eats breakfast at times. Normally eats one good meal during the day and eats junk in between such as cookies and sodas.  Eats a frozen dinner during lunch. Does not know if she gets enough protein.   Only has pain in her feet and hands bilaterally.  No neck or back pain.   Has been using a rw since May 2018.  Was independent with gait before May 2018.  Pt goal is to improve her function.    Patient Stated Goals I want to be able to get out of the walker and not use a cane. Be able to do all the things she used to do such as cooking a meal, laundry,    Currently in Pain? Other (Comment)  no pain other than the hands and feet                                 PT Education - 06/03/17 1309    Education provided Yes   Education Details ther-ex   [employer]) Educated Patient   Methods Explanation;Demonstration;Tactile cues;Verbal cues   Comprehension Verbalized understanding;Returned demonstration        Objectives   There-ex  Blood pressure R arm sitting,  mechanically taken: 92/65, HR 105 at start of session. Pt denies lightheadedness or dizziness SpO2 98% room Air  Seated hip adduction ball squeeze with glute max squeeze 10x5 seconds for 3 sets  Seated LAQ resisting 2 lbs 10x2 each LE  Seated R ankle DF with PT assist to end range 10x2 with 5 second holds at end range  Blood pressure R arm sitting, mechanically taken: 99/62, HR 105.    Seated low rows resisting red band 10x5 seconds for 3 sets  R ankle DF stretch with PT 30 second x 3 in sitting  Seated hip extension isometrics 10x5 seconds each LE  Seated heel slides to promote soleus stretching R side 30 seconds x 4 to promote ankle DF ROM.     Improved exercise technique, movement at target joints, use  of target muscles after min to mod verbal, visual, tactile cues.     Exercises to promote trunk, scapular, UE, LE strengthening, and R ankle DF performed today in sitting secondary to low blood pressure levels. R tibialis anterior muscle activation palpated with assisted R ankle DF. Also worked on [employer] and soleus muscle stretching to promote ability to perform R ankle DF. No complain of increased pain throughout session.               PT Long Term Goals - 06/01/17 1400      PT LONG TERM GOAL #1   Title Patient will be able to perform her TUG time using a rw to 18 seconds or less to promote functional mobility.   Baseline 25.3 seconds average with rw (04/20/2017); 12 seconds on average with SPC (05/20/2017)   Time 6   Period Weeks   Status Achieved     PT LONG TERM GOAL #2   Title Patient will improve bilateral UE and bilateral hip, and knee strength to promote ability to perform functional tasks at home.    Time 4   Period Weeks   Status On-going   Target Date 07/01/17     PT LONG TERM GOAL #3   Title Patient will improve her LEFS score by at least 9 points as a demonstration of improved function.    Baseline 25/80 (04/20/2017); 57/80 (05/20/2017)   Time 6    Period Weeks   Status Achieved     PT LONG TERM GOAL #4   Title Patient will improve her Quick Dash Disability/Symptom score by at least 15% as a demonstration of improved function.    Baseline 70.45% (04/20/2017); 25% (05/20/2017)   Time 6   Period Weeks   Status Achieved     PT LONG TERM GOAL #5   Title Patient will be able to perform her TUG time without AD to 12 seconds or less to promote functional mobility.   Baseline 12 seconds with SPC (05/20/2017); 10 seconds average without AD (06/01/2017)   Time 4   Period Weeks   Status Achieved     Additional Long Term Goals   Additional Long Term Goals Yes     PT LONG TERM GOAL #6   Title Pt will be able to ambulate at least 6 minutes without AD with SBA and no LOB to promote mobility and function.    Baseline able to ambulate 400 ft without AD with CGA, no LOB (05/20/2017); able to ambulate 1042 ft in 6 min without AD and with SBA, no LOB (06/01/2017)    Time 4   Period Weeks   Status Achieved     PT LONG TERM GOAL #7   Title Pt will improve supine R ankle DF AROM to - 17 degrees or less to promote foot clearance during gait.     Baseline -28 degrees (06/01/2017)   Time 4   Period Weeks   Status New   Target Date 07/01/17     PT LONG TERM GOAL #8   Title Pt will improve her BERG balance score to 47/56 or more as a demonstration of improved balance.    Baseline 42/56 (05/25/2017)   Time 4   Period Days   Status New   Target Date 07/01/17               Plan - 06/03/17 1257    Clinical Impression Statement Exercises to promote trunk, scapular, UE, LE strengthening, and R ankle  DF performed today in sitting secondary to low blood pressure levels. R tibialis anterior muscle activation palpated with assisted R ankle DF. Also worked on [employer] and soleus muscle stretching to promote ability to perform R ankle DF. No complain of increased pain throughout session.    History and Personal Factors relevant to plan of care: hx of CA,  weakness, multiple health problems   Clinical Presentation Stable   Clinical Presentation due to: no complain of increased pain throughout session; good muscle use felt during exercises   Clinical Decision Making Low   Rehab Potential Fair   Clinical Impairments Affecting Rehab Potential hx of CA, multiple health problems   PT Frequency 2x / week   PT Duration 6 weeks   PT Treatment/Interventions Manual techniques;Patient/family education;Therapeutic activities;Therapeutic exercise;Gait training;Neuromuscular re-education;Balance training   PT Next Visit Plan hip, scapular, LE, UE strengthening, gait, balance, femoral control   Consulted and Agree with Plan of Care Patient      Patient will benefit from skilled therapeutic intervention in order to improve the following deficits and impairments:  Pain, Improper body mechanics, Postural dysfunction, Impaired UE functional use, Decreased strength, Difficulty walking, Decreased endurance  Visit Diagnosis: Muscle weakness (generalized)  Difficulty in walking, not elsewhere classified     Problem List Patient Active Problem List   Diagnosis Date Noted  . Brain metastasis ([place]) 05/19/2017  . Blood in stool   . Polyp of sigmoid colon   . Benign neoplasm of transverse colon   . Benign neoplasm of ascending colon   . Lower GI bleed   . Weakness of right lower extremity 02/11/2017  . Orthostatic lightheadedness 01/14/2017  . Atrial tachycardia ([place]) 01/14/2017  . Hypokalemia 01/14/2017  . Pulmonary emboli ([place]) 01/12/2017  . Dehydration 12/25/2016  . Blood loss anemia 12/15/2016  . Ulceration of colon   . Heartburn   . Duodenitis   . Gastritis without bleeding   . GI bleed   . Acute esophagitis   . Acute esophagogastric ulcer   . Rectal bleed   . Hypovolemic shock ([place])   . Ischemic leg 12/04/2016  . Chemotherapy induced neutropenia ([place]) 11/20/2016  . Palliative care by specialist   . Goals of care, counseling/discussion    . DNR (do not resuscitate) discussion   . Cerebral edema ([place]) 11/10/2016  . Encounter for monitoring cardiotoxic drug therapy 11/09/2016  . Elevated LFTs 10/20/2016  . URI with cough and congestion 08/25/2016  . Peripheral neuropathy due to chemotherapy ([place]) 03/13/2016  . Encounter for antineoplastic chemotherapy 03/13/2016  . HLD (hyperlipidemia) 01/29/2016  . Hyperglycemia, unspecified 01/29/2016  . Cardiomyopathy ([place]) 01/13/2016  . Hypertension 07/18/2015  . Carcinoma of upper-outer quadrant of left breast in female, estrogen receptor positive ([place]) 12/27/2014    Joneen Boers PT, DPT   06/03/2017, 1:48 PM  [facility] [place] [address], [place], [address] Phone: [phone]   Fax:  [phone]  Name: Sarah Horne MRN: [id number] Date of Birth: [birth year]

## 2017-06-03 NOTE — Therapy (Signed)
[facility] [address] [place], [address] Phone: [phone]   Fax:  [phone]  Occupational Therapy Treatment  Patient Details  Name: Sarah Horne MRN: [id number] Date of Birth: Mar 21, 1956 Referring Provider: Rogue Bussing  Encounter Date: 06/03/2017      OT End of Session - 06/03/17 1414    Visit Number 5   Number of Visits 12   Date for OT Re-Evaluation 06/14/17   OT Start Time 0630   OT Stop Time 1408   OT Time Calculation (min) 20 min   Activity Tolerance Patient tolerated treatment well   Behavior During Therapy [facility] for tasks assessed/performed      Past Medical History:  Diagnosis Date  . Chemotherapy-induced peripheral neuropathy ([place])   . Epistaxis    a. 11/2016 in setting of asa/plavix-->silver nitrate cauterization.  . GI bleed    a. 11/2016 Admission w/ GIB and hypovolemic shock req 3u PRBC's;  b. 11/2016 ECG: gastritis & nonbleeding peptic ulcer; c. 11/2016 Conlonoscopy: rectal and sigmoid colonic ulcers.  . Heart attack ([place])    a. 1998 Cath @ [facility]: reportedly no intervention required.  Marland Kitchen Herceptin-induced cardiomyopathy ([place])    a. In the setting of Herceptin Rx for breast cancer (initiated 12/2014); b. 03/2015 MUGA EF 64%; b. 08/2015 MUGA: EF 51%; c. 10/2015 MUGA: EF 44%; d. 11/2015 Echo: EF 45-50%; e. 01/2016 MUGA: EF 60%; f. 06/2016 MUGA EF 65%; g. 10/2016 MUGA: EF 61%;  h. 12/2016 Echo: EF 55-60%, gr1 DD.  Marland Kitchen Hyperlipidemia   . Hypertension   . Neuropathy   . Possible PAD (peripheral artery disease) ([place])    a. 11/2016 LE cyanosis and weak pulses-->CTA w/o significant Ao-BiFem dzs. ? distal dzs-->ASA/Plavix initiated by vascular surgery.  Marland Kitchen PSVT (paroxysmal supraventricular tachycardia) ([place])    a. Dx 11/2016.  . Pulmonary embolism ([place])    a. 12/2016 CTA Chest: small nonocclusive PE in inferior segment of the Left lingula, somewhat eccentric filling defect suggesting chronic rather than acute  embolic event; b. 09/6008 LE U/S:  No DVT; c. 12/2016 Echo: Nl RV fxn, nl PASP.  Marland Kitchen Recurrent Metastatic breast cancer ([place])    a. Dx 2016: Stage II, ER positive, PR positive, HER-2/neu overexpressing of the left breast-->chemo/radiation; b. 10/2016 CT Abd/pelvis: diffuse liver mets, ill defined sclerotic bone lesions-T12;  c. 10/2016 MRI brain: metastatic lesion along L temporal lobe (19x77m) w/ extensive surrounding edema & 539mmidline shift to right.  . Sinus tachycardia     Past Surgical History:  Procedure Laterality Date  . BREAST BIOPSY Left 2016   Positive  . BREAST LUMPECTOMY WITH SENTINEL LYMPH NODE BIOPSY Left 05/23/2015   Procedure: LEFT BREAST WIDE EXCISION WITH AXILLARY DISSECTION, MASTOPLASTY ;  Surgeon: JeRobert BellowMD;  Location: [facility] ORS;  Service: General;  Laterality: Left;  . BREAST SURGERY Left 12/18/14   breast biopsy/INVASIVE DUCTAL CARCINOMA OF BREAST, NOTTINGHAM GRADE 2.  . Marland KitchenREAST SURGERY  05/23/2015.   Wide excision/mastoplasty, axillary dissection. No residual invasive cancer, positive for residual DCIS. 0/2 nodes identified on axillary dissection. (no SLN by technetium or methylene blue)  . CARDIAC CATHETERIZATION    . COLONOSCOPY WITH PROPOFOL N/A 12/10/2016   Procedure: COLONOSCOPY WITH PROPOFOL;  Surgeon: DaLucilla LameMD;  Location: [facility] ENDOSCOPY;  Service: Endoscopy;  Laterality: N/A;  . COLONOSCOPY WITH PROPOFOL N/A 02/13/2017   Procedure: COLONOSCOPY WITH PROPOFOL;  Surgeon: WoLucilla LameMD;  [facility];  Service: Endoscopy;  Laterality:  N/A;  . ESOPHAGOGASTRODUODENOSCOPY (EGD) WITH PROPOFOL N/A 12/08/2016   Procedure: ESOPHAGOGASTRODUODENOSCOPY (EGD) WITH PROPOFOL;  Surgeon: Lucilla Lame, MD;  Location: [facility] ENDOSCOPY;  Service: Endoscopy;  Laterality: N/A;  . IVC FILTER INSERTION N/A 02/15/2017   Procedure: IVC Filter Insertion;  Surgeon: Algernon Huxley, MD;  Location: [facility];  Service: Cardiovascular;  Laterality: N/A;  . PORTACATH  PLACEMENT Right 12-31-14   Dr Bary Castilla    There were no vitals filed for this visit.      Subjective Assessment - 06/03/17 1408    Subjective  Im doing okay - I can do most everything - I think I can carry about 10 lbs - still the burning pain that stays about the same - about 5-6/10    Patient Stated Goals Patient reports she would like to be able to write and do all the things she did before.    Currently in Pain? Yes   Pain Score 6    Pain Location Hand   Pain Orientation Right;Left   Pain Descriptors / Indicators Burning   Pain Type Chronic pain            [facility] OT Assessment - 06/03/17 0001      Strength   Right Hand Grip (lbs) 30   Right Hand Lateral Pinch 6 lbs   Right Hand 3 Point Pinch 6 lbs   Left Hand Grip (lbs) 32   Left Hand Lateral Pinch 6 lbs   Left Hand 3 Point Pinch 6 lbs        assess grip and prehension strength  PRWHE for function  - simulate tasks  Score 0/50 for function - no trouble  and performing HEP assess with green putty and add dark green firm - for pt to add to her green putty at home - to increase resistance and bulk  Cont with putty HEP    Show Froments test with lat grip - R worse than L                         OT Education - 06/03/17 1413    Education provided Yes   Education Details HEP    Person(s) Educated Patient   Methods Explanation;Demonstration;Tactile cues;Verbal cues   Comprehension Verbal cues required;Returned demonstration;Verbalized understanding             OT Long Term Goals - 05/09/17 2113      OT LONG TERM GOAL #1   Title Patient will demonstrate home exercise program with modified independence.    Baseline none   Time 3   Period Weeks   Status New   Target Date 05/24/17     OT LONG TERM GOAL #2   Title Patient will increase grip strength in bilateral hands by 10# to assist with holding objects and opening containers.    Baseline R 8#, L 16#   Time 6   Period Weeks    Status New   Target Date 06/14/17     OT LONG TERM GOAL #3   Title Patient will complete peeling and cutting vegetables with modified independence using adaptive equipment as needed.    Baseline moderate assist   Time 6   Period Weeks   Status New   Target Date 06/14/17     OT LONG TERM GOAL #4   Title Patient will improve pinch by 2# to assist with pulling button out on washer with modified independence.    Baseline  limited pinch BUE   Time 6   Period Weeks   Status New   Target Date 06/14/17     OT LONG TERM GOAL #5   Title Patient will report pain in bilateral hands 2 or less to perform daily tasks.    Baseline 4 at eval   Time 6   Period Weeks   Status New   Target Date 06/14/17               Plan - 06/03/17 1416    Occupational performance deficits (Please refer to evaluation for details): ADL's;IADL's;Play;Leisure   Rehab Potential Good   Current Impairments/barriers affecting progress: Age, comorbidities, history of cancer with chemo tx and neuropathy.     OT Frequency Biweekly   OT Duration --  3 wks   OT Treatment/Interventions Self-care/ADL training;Moist Heat;Fluidtherapy;DME and/or AE instruction;Patient/family education;Splinting;Contrast Bath;Therapeutic exercises;Ultrasound;Therapeutic exercise;Therapeutic activities;Neuromuscular education;Parrafin;Energy conservation;Manual Therapy   Plan Reassess in 3 wks - grip - to increase    Clinical Decision Making Limited treatment options, no task modification necessary   OT Home Exercise Plan Ulnar and Med  Nglides added - cont with putty    Consulted and Agree with Plan of Care Patient      Patient will benefit from skilled therapeutic intervention in order to improve the following deficits and impairments:     Visit Diagnosis: Muscle weakness (generalized)  Pain in left hand  Pain in right hand    Problem List Patient Active Problem List   Diagnosis Date Noted  . Brain metastasis ([place])  05/19/2017  . Blood in stool   . Polyp of sigmoid colon   . Benign neoplasm of transverse colon   . Benign neoplasm of ascending colon   . Lower GI bleed   . Weakness of right lower extremity 02/11/2017  . Orthostatic lightheadedness 01/14/2017  . Atrial tachycardia ([place]) 01/14/2017  . Hypokalemia 01/14/2017  . Pulmonary emboli ([place]) 01/12/2017  . Dehydration 12/25/2016  . Blood loss anemia 12/15/2016  . Ulceration of colon   . Heartburn   . Duodenitis   . Gastritis without bleeding   . GI bleed   . Acute esophagitis   . Acute esophagogastric ulcer   . Rectal bleed   . Hypovolemic shock ([place])   . Ischemic leg 12/04/2016  . Chemotherapy induced neutropenia ([place]) 11/20/2016  . Palliative care by specialist   . Goals of care, counseling/discussion   . DNR (do not resuscitate) discussion   . Cerebral edema ([place]) 11/10/2016  . Encounter for monitoring cardiotoxic drug therapy 11/09/2016  . Elevated LFTs 10/20/2016  . URI with cough and congestion 08/25/2016  . Peripheral neuropathy due to chemotherapy ([place]) 03/13/2016  . Encounter for antineoplastic chemotherapy 03/13/2016  . HLD (hyperlipidemia) 01/29/2016  . Hyperglycemia, unspecified 01/29/2016  . Cardiomyopathy ([place]) 01/13/2016  . Hypertension 07/18/2015  . Carcinoma of upper-outer quadrant of left breast in female, estrogen receptor positive ([place]) 12/27/2014    Rosalyn Gess OTR/L,CLT  06/03/2017, 2:18 PM  [place] [facility] [address] [place], [address] Phone: [phone]   Fax:  [phone]  Name: LOVE CHOWNING MRN: [id number] Date of Birth: [birth year]

## 2017-06-03 NOTE — Patient Instructions (Signed)
Same HEP but increase resistance - adding firm dark green to medium and cont with grip , lat and 3  Point grip

## 2017-06-04 ENCOUNTER — Ambulatory Visit (INDEPENDENT_AMBULATORY_CARE_PROVIDER_SITE_OTHER): Payer: BLUE CROSS/BLUE SHIELD | Admitting: Family Medicine

## 2017-06-04 ENCOUNTER — Ambulatory Visit
Admission: RE | Admit: 2017-06-04 | Discharge: 2017-06-04 | Disposition: A | Discharge status: Home / Self Care | Payer: BLUE CROSS/BLUE SHIELD | Source: Ambulatory Visit | Attending: Family Medicine | Admitting: Family Medicine

## 2017-06-04 ENCOUNTER — Encounter: Payer: Self-pay | Admitting: Family Medicine

## 2017-06-04 VITALS — BP 100/72 | HR 98 | Temp 98.4°F | Resp 16 | Ht 66.0 in | Wt 137.9 lb

## 2017-06-04 DIAGNOSIS — J069 Acute upper respiratory infection, unspecified: Secondary | ICD-10-CM

## 2017-06-04 DIAGNOSIS — R0689 Other abnormalities of breathing: Secondary | ICD-10-CM | POA: Diagnosis not present

## 2017-06-04 DIAGNOSIS — R05 Cough: Secondary | ICD-10-CM | POA: Diagnosis not present

## 2017-06-04 DIAGNOSIS — I10 Essential (primary) hypertension: Secondary | ICD-10-CM | POA: Diagnosis not present

## 2017-06-04 MED ORDER — FLUTICASONE PROPIONATE 50 MCG/ACT NA SUSP: 2.0000 | Freq: Every day | NASAL | 0 refills | Status: DC

## 2017-06-04 MED ORDER — BENZONATATE 100 MG PO CAPS: 100.0000 mg | ORAL_CAPSULE | Freq: Three times a day (TID) | ORAL | 0 refills | Status: DC | PRN

## 2017-06-04 NOTE — Patient Instructions (Addendum)
-   Please go to the [facility] for a Chest Xray.  - Please take Over the counter Mucinex to help with your congestion.  Upper Respiratory Infection, Adult Most upper respiratory infections (URIs) are caused by a virus. A URI affects the nose, throat, and upper air passages. The most common type of URI is often called "the common cold." Follow these instructions at home:  Take medicines only as told by your doctor.  Gargle warm saltwater or take cough drops to comfort your throat as told by your doctor.  Use a warm mist humidifier or inhale steam from a shower to increase air moisture. This may make it easier to breathe.  Drink enough fluid to keep your pee (urine) clear or pale yellow.  Eat soups and other clear broths.  Have a healthy diet.  Rest as needed.  Go back to work when your fever is gone or your doctor says it is okay. ? You may need to stay home longer to avoid giving your URI to others. ? You can also wear a face mask and wash your hands often to prevent spread of the virus.  Use your inhaler more if you have asthma.  Do not use any tobacco products, including cigarettes, chewing tobacco, or electronic cigarettes. If you need help quitting, ask your doctor. Contact a doctor if:  You are getting worse, not better.  Your symptoms are not helped by medicine.  You have chills.  You are getting more short of breath.  You have brown or red mucus.  You have yellow or brown discharge from your nose.  You have pain in your face, especially when you bend forward.  You have a fever.  You have puffy (swollen) neck glands.  You have pain while swallowing.  You have white areas in the back of your throat. Get help right away if:  You have very bad or constant: ? Headache. ? Ear pain. ? Pain in your forehead, behind your eyes, and over your cheekbones (sinus pain). ? Chest pain.  You have long-lasting (chronic) lung disease  and any of the following: ? Wheezing. ? Long-lasting cough. ? Coughing up blood. ? A change in your usual mucus.  You have a stiff neck.  You have changes in your: ? Vision. ? Hearing. ? Thinking. ? Mood. This information is not intended to replace advice given to you by your health care provider. Make sure you discuss any questions you have with your health care provider. Document Released: 03/02/2008 Document Revised: 05/17/2016 Document Reviewed: 12/20/2013 Elsevier Interactive Patient Education  2018 [employer].

## 2017-06-04 NOTE — Progress Notes (Addendum)
Name: Sarah Horne   MRN: [id number]    DOB: 29-Jul-1956   Date:06/04/2017       Progress Note  Subjective  Chief Complaint  Chief Complaint  Patient presents with  . Sinusitis    cough, congested, scratchy throat    HPI  Patient presents with complaint of productive cough, nasal congestion, rhinorrhea, sore throat all of which started after burning some trash outside at her home.  No fevers/chills, history of allergies, sinus pain/pressure, ear pain/pressure, chest pain or shortness of breath.  Patient is every day smoker - has quit in the past and says she would like to quit again; currently smoking about 4 cigarettes a day.  BP is noted to be low end of normal - no lightheadedness, headaches, confusion. Not taking any medication for HTN at present time.  Pt is currently in treatment for breast cancer, and has been tolerating treatment - currently taking Abemaciclib and Arimidex.  Patient Active Problem List   Diagnosis Date Noted  . Brain metastasis ([place]) 05/19/2017  . Blood in stool   . Polyp of sigmoid colon   . Benign neoplasm of transverse colon   . Benign neoplasm of ascending colon   . Lower GI bleed   . Weakness of right lower extremity 02/11/2017  . Orthostatic lightheadedness 01/14/2017  . Atrial tachycardia ([place]) 01/14/2017  . Hypokalemia 01/14/2017  . Pulmonary emboli ([place]) 01/12/2017  . Dehydration 12/25/2016  . Blood loss anemia 12/15/2016  . Ulceration of colon   . Heartburn   . Duodenitis   . Gastritis without bleeding   . GI bleed   . Acute esophagitis   . Acute esophagogastric ulcer   . Rectal bleed   . Hypovolemic shock ([place])   . Ischemic leg 12/04/2016  . Chemotherapy induced neutropenia ([place]) 11/20/2016  . Palliative care by specialist   . Goals of care, counseling/discussion   . DNR (do not resuscitate) discussion   . Cerebral edema ([place]) 11/10/2016  . Encounter for monitoring cardiotoxic drug therapy 11/09/2016  . Elevated LFTs 10/20/2016  .  URI with cough and congestion 08/25/2016  . Peripheral neuropathy due to chemotherapy ([place]) 03/13/2016  . Encounter for antineoplastic chemotherapy 03/13/2016  . HLD (hyperlipidemia) 01/29/2016  . Hyperglycemia, unspecified 01/29/2016  . Cardiomyopathy ([place]) 01/13/2016  . Hypertension 07/18/2015  . Carcinoma of upper-outer quadrant of left breast in female, estrogen receptor positive ([place]) 12/27/2014    Social History  Substance Use Topics  . Smoking status: Light Tobacco Smoker    Packs/day: 0.50    Years: 18.00    Types: Cigarettes  . Smokeless tobacco: Never Used  . Alcohol use No    Current Outpatient Prescriptions:  .  Abemaciclib 50 MG TABS, Take 1 tablet by mouth daily. DO NOT START Until seen by MD., Disp: 30 tablet, Rfl: 3 .  anastrozole (ARIMIDEX) 1 MG tablet, Take 1 tablet (1 mg total) by mouth daily., Disp: 90 tablet, Rfl: 3 .  diphenoxylate-atropine (LOMOTIL) 2.5-0.025 MG tablet, Take 1 tablet by mouth 4 (four) times daily as needed for diarrhea or loose stools. Take it along with immodium, Disp: 60 tablet, Rfl: 0 .  feeding supplement, ENSURE ENLIVE, (ENSURE ENLIVE) LIQD, Take 237 mLs by mouth 2 (two) times daily between meals., Disp: 237 mL, Rfl: 12 .  flecainide (TAMBOCOR) 50 MG tablet, TAKE 1 TABLET BY MOUTH TWICE A DAY, Disp: 60 tablet, Rfl: 1 .  magnesium oxide (MAG-OX) 400 MG tablet, Take 1 tablet (400 mg total) by  mouth 2 (two) times daily., Disp: 60 tablet, Rfl: 6 .  oxyCODONE (OXY IR/ROXICODONE) 5 MG immediate release tablet, Take 1 tablet (5 mg total) by mouth every 8 (eight) hours as needed for severe pain., Disp: 42 tablet, Rfl: 0 .  potassium chloride SA (K-DUR,KLOR-CON) 20 MEQ tablet, Take 1 tablet (20 mEq total) by mouth daily., Disp: 90 tablet, Rfl: 3 .  pregabalin (LYRICA) 75 MG capsule, Take 1 capsule (75 mg total) by mouth 2 (two) times daily., Disp: 60 capsule, Rfl: 3 .  ondansetron (ZOFRAN) 8 MG tablet, Take 1 tablet (8 mg total) by mouth every 8  (eight) hours as needed for nausea or vomiting (start 3 days; after chemo). (Patient not taking: Reported on 06/04/2017), Disp: 40 tablet, Rfl: 1 No current facility-administered medications for this visit.   Facility-Administered Medications Ordered in Other Visits:  .  0.9 %  sodium chloride infusion, , Intravenous, Continuous, Pandit, Sandeep, MD .  0.9 %  sodium chloride infusion, , Intravenous, Continuous, Finnegan, Kathlene November, MD, Last Rate: 999 mL/hr at 04/24/15 1520 .  heparin lock flush 100 unit/mL, 500 Units, Intravenous, Once, Berenzon, Dmitriy, MD .  sodium chloride 0.9 % injection 10 mL, 10 mL, Intravenous, PRN, Ma Hillock, Sandeep, MD, 10 mL at 04/04/15 1440 .  sodium chloride flush (NS) 0.9 % injection 10 mL, 10 mL, Intravenous, PRN, Berenzon, Dmitriy, MD .  T[facility]-Filgrast[facility]) injection 480 mcg, 480 mcg, Subcutaneous, Once, Cammie Sickle, MD  Allergies  Allergen Reactions  . No Known Allergies     ROS  Constitutional: Negative for fever or weight change.  Respiratory: Positive for cough; no shortness of breath.   Cardiovascular: Negative for chest pain or palpitations.  Gastrointestinal: Negative for abdominal pain, no bowel changes.  Musculoskeletal: Negative for gait problem or joint swelling.  Skin: Negative for rash.  Neurological: Negative for dizziness or headache.  No other specific complaints in a complete review of systems (except as listed in HPI above).  Objective  Vitals:   06/04/17 0950  BP: 100/72  Pulse: 98  Resp: 16  Temp: 98.4 F (36.9 C)  TempSrc: Oral  SpO2: 96%  Weight: 137 lb 14.4 oz (62.6 kg)  Height: _0  (1.676 m)   Body mass index is 22.26 kg/m.  Nursing Note and Vital Signs reviewed.  Physical Exam  Constitutional: Patient appears well-developed and well-nourished. No distress.  HEENT: head atraumatic, normocephalic, pupils equal and reactive to light, EOM's intact, TM's without erythema or bulging, Nares boggy  bilaterally, no maxillary or frontal sinus pain on palpation, neck supple without lymphadenopathy, oropharynx pink and moist without exudate Cardiovascular: Normal rate, regular rhythm, S1/S2 present.  No murmur or rub heard. No BLE edema. Pulmonary/Chest: Effort normal and breath sounds clear but decreased in RUL. No respiratory distress or retractions. Psychiatric: Patient has a normal mood and affect. behavior is normal. Judgment and thought content normal.  Recent Results (from the past 2160 hour(s))  Type and screen     Status: None   Collection Time: 03/08/17  3:16 PM  Result Value Ref Range   ABO/RH(D) A POS    Antibody Screen NEG    Sample Expiration 03/11/2017   CBC with Differential/Platelet     Status: Abnormal   Collection Time: 03/08/17  3:28 PM  Result Value Ref Range   WBC 3.5 (L) 3.6 - 11.0 K/uL   RBC 3.25 (L) 3.80 - 5.20 MIL/uL   Hemoglobin 9.6 (L) 12.0 - 16.0 g/dL   HCT 29.5 (  L) 35.0 - 47.0 %   MCV 90.7 80.0 - 100.0 fL   MCH 29.5 26.0 - 34.0 pg   MCHC 32.5 32.0 - 36.0 g/dL   RDW 22.9 (H) 11.5 - 14.5 %   Platelets 224 150 - 440 K/uL   Neutrophils Relative % 71 %   Neutro Abs 2.5 1.4 - 6.5 K/uL   Lymphocytes Relative 17 %   Lymphs Abs 0.6 (L) 1.0 - 3.6 K/uL   Monocytes Relative 10 %   Monocytes Absolute 0.4 0.2 - 0.9 K/uL   Eosinophils Relative 1 %   Eosinophils Absolute 0.0 0 - 0.7 K/uL   Basophils Relative 1 %   Basophils Absolute 0.0 0 - 0.1 K/uL  Protime-INR     Status: None   Collection Time: 03/16/17  2:19 PM  Result Value Ref Range   Prothrombin Time 14.3 11.4 - 15.2 seconds   INR 1.11   Comprehensive metabolic panel     Status: Abnormal   Collection Time: 03/16/17  2:19 PM  Result Value Ref Range   Sodium 133 (L) 135 - 145 mmol/L   Potassium 3.8 3.5 - 5.1 mmol/L   Chloride 102 101 - 111 mmol/L   CO2 25 22 - 32 mmol/L   Glucose, Bld 94 65 - 99 mg/dL   BUN 8 6 - 20 mg/dL   Creatinine, Ser 0.83 0.44 - 1.00 mg/dL   Calcium 7.9 (L) 8.9 - 10.3 mg/dL    Total Protein 6.8 6.5 - 8.1 g/dL   Albumin 1.9 (L) 3.5 - 5.0 g/dL   AST 182 (H) 15 - 41 U/L   ALT 68 (H) 14 - 54 U/L   Alkaline Phosphatase 274 (H) 38 - 126 U/L   Total Bilirubin 4.5 (H) 0.3 - 1.2 mg/dL   GFR calc non Af Amer >60 >60 mL/min   GFR calc Af Amer >60 >60 mL/min    Comment: (NOTE) The eGFR has been calculated using the CKD EPI equation. This calculation has not been validated in all clinical situations. eGFR's persistently <60 mL/min signify possible Chronic Kidney Disease.    Anion gap 6 5 - 15  Magnesium     Status: None   Collection Time: 03/16/17  2:19 PM  Result Value Ref Range   Magnesium 1.7 1.7 - 2.4 mg/dL  CBC with Differential     Status: Abnormal   Collection Time: 03/16/17  2:19 PM  Result Value Ref Range   WBC 2.8 (L) 3.6 - 11.0 K/uL   RBC 3.16 (L) 3.80 - 5.20 MIL/uL   Hemoglobin 9.6 (L) 12.0 - 16.0 g/dL   HCT 28.7 (L) 35.0 - 47.0 %   MCV 90.7 80.0 - 100.0 fL   MCH 30.2 26.0 - 34.0 pg   MCHC 33.3 32.0 - 36.0 g/dL   RDW 22.1 (H) 11.5 - 14.5 %   Platelets 204 150 - 440 K/uL   Neutrophils Relative % 71 %   Neutro Abs 2.0 1.4 - 6.5 K/uL   Lymphocytes Relative 25 %   Lymphs Abs 0.7 (L) 1.0 - 3.6 K/uL   Monocytes Relative 3 %   Monocytes Absolute 0.1 (L) 0.2 - 0.9 K/uL   Eosinophils Relative 1 %   Eosinophils Absolute 0.0 0 - 0.7 K/uL   Basophils Relative 0 %   Basophils Absolute 0.0 0 - 0.1 K/uL  CBC with Differential/Platelet     Status: Abnormal   Collection Time: 04/02/17  8:15 AM  Result Value Ref Range  WBC 1.0 (LL) 3.6 - 11.0 K/uL    Comment: [facility]ENTER CRITICAL VALUE PROTOCOL   RBC 3.07 (L) 3.80 - 5.20 MIL/uL   Hemoglobin 9.9 (L) 12.0 - 16.0 g/dL   HCT 30.2 (L) 35.0 - 47.0 %   MCV 98.3 80.0 - 100.0 fL   MCH 32.1 26.0 - 34.0 pg   MCHC 32.7 32.0 - 36.0 g/dL   RDW 26.6 (H) 11.5 - 14.5 %   Platelets 58 (L) 150 - 440 K/uL   Neutrophils Relative % 55 %   Neutro Abs 0.5 (L) 1.4 - 6.5 K/uL   Lymphocytes Relative 39 %   Lymphs Abs 0.4  (L) 1.0 - 3.6 K/uL   Monocytes Relative 4 %   Monocytes Absolute 0.0 (L) 0.2 - 0.9 K/uL   Eosinophils Relative 1 %   Eosinophils Absolute 0.0 0 - 0.7 K/uL   Basophils Relative 1 %   Basophils Absolute 0.0 0 - 0.1 K/uL  Comprehensive metabolic panel     Status: Abnormal   Collection Time: 04/02/17  8:15 AM  Result Value Ref Range   Sodium 133 (L) 135 - 145 mmol/L   Potassium 3.4 (L) 3.5 - 5.1 mmol/L   Chloride 102 101 - 111 mmol/L   CO2 25 22 - 32 mmol/L   Glucose, Bld 100 (H) 65 - 99 mg/dL   BUN 11 6 - 20 mg/dL   Creatinine, Ser 0.85 0.44 - 1.00 mg/dL   Calcium 8.2 (L) 8.9 - 10.3 mg/dL   Total Protein 6.7 6.5 - 8.1 g/dL   Albumin 2.0 (L) 3.5 - 5.0 g/dL   AST 81 (H) 15 - 41 U/L   ALT 35 14 - 54 U/L   Alkaline Phosphatase 231 (H) 38 - 126 U/L   Total Bilirubin 2.1 (H) 0.3 - 1.2 mg/dL   GFR calc non Af Amer >60 >60 mL/min   GFR calc Af Amer >60 >60 mL/min    Comment: (NOTE) The eGFR has been calculated using the CKD EPI equation. This calculation has not been validated in all clinical situations. eGFR's persistently <60 mL/min signify possible Chronic Kidney Disease.    Anion gap 6 5 - 15  Cancer antigen 15-3     Status: None   Collection Time: 04/02/17  8:15 AM  Result Value Ref Range   CA 15-3 24.1 0.0 - 25.0 U/mL    Comment: (NOTE) Roche ECLIA  methodology Performed At: [facility] [place], [place] [id number] Lindon Romp MD [id number]   Cancer antigen 27.29     Status: None   Collection Time: 04/02/17  8:15 AM  Result Value Ref Range   CA 27.29 32.4 0.0 - 38.6 U/mL    Comment: (NOTE) Bayer Centaur/ACS methodology Performed At: [facility] [place], [place] [id number] Lindon Romp MD [id number]   CBC     Status: Abnormal   Collection Time: 04/09/17 10:38 AM  Result Value Ref Range   WBC 0.8 (LL) 3.6 - 11.0 K/uL    Comment: CAN[facility]ER CENTER CRITICAL VALUE PROTOCOL RESULT REPEATED AND VERIFIED    RBC  3.15 (L) 3.80 - 5.20 MIL/uL   Hemoglobin 10.5 (L) 12.0 - 16.0 g/dL   HCT 31.2 (L) 35.0 - 47.0 %   MCV 99.1 80.0 - 100.0 fL   MCH 33.4 26.0 - 34.0 pg   MCHC 33.7 32.0 - 36.0 g/dL   RDW 26.6 (H) 11.5 - 14.5 %   Platelets 60 (L) 150 -  440 K/uL  CBC     Status: Abnormal   Collection Time: 04/19/17 10:40 AM  Result Value Ref Range   WBC 1.3 (LL) 3.6 - 11.0 K/uL    Comment: RESULT REPEATED AND VERIFIED CANCER CENTER CRITICAL VALUE PROTOCOL    RBC 3.17 (L) 3.80 - 5.20 MIL/uL   Hemoglobin 10.6 (L) 12.0 - 16.0 g/dL   HCT 31.0 (L) 35.0 - 47.0 %   MCV 97.8 80.0 - 100.0 fL   MCH 33.3 26.0 - 34.0 pg   MCHC 34.1 32.0 - 36.0 g/dL   RDW 24.7 (H) 11.5 - 14.5 %   Platelets 140 (L) 150 - 440 K/uL  Cancer antigen 27.29     Status: None   Collection Time: 05/05/17  2:02 PM  Result Value Ref Range   CA 27.29 19.5 0.0 - 38.6 U/mL    Comment: (NOTE) Bayer Centaur/ACS methodology Performed At: [facility] [place], [place] [id number] Lindon Romp MD [id number]   CBC with Differential     Status: Abnormal   Collection Time: 05/05/17  2:02 PM  Result Value Ref Range   WBC 2.5 (L) 3.6 - 11.0 K/uL   RBC 3.32 (L) 3.80 - 5.20 MIL/uL   Hemoglobin 10.5 (L) 12.0 - 16.0 g/dL   HCT 31.2 (L) 35.0 - 47.0 %   MCV 94.2 80.0 - 100.0 fL   MCH 31.7 26.0 - 34.0 pg   MCHC 33.7 32.0 - 36.0 g/dL   RDW 22.6 (H) 11.5 - 14.5 %   Platelets 233 150 - 440 K/uL   Neutrophils Relative % 63 %   Neutro Abs 1.5 1.4 - 6.5 K/uL   Lymphocytes Relative 15 %   Lymphs Abs 0.4 (L) 1.0 - 3.6 K/uL   Monocytes Relative 11 %   Monocytes Absolute 0.3 0.2 - 0.9 K/uL   Eosinophils Relative 11 %   Eosinophils Absolute 0.3 0 - 0.7 K/uL   Basophils Relative 0 %   Basophils Absolute 0.0 0 - 0.1 K/uL  Comprehensive metabolic panel     Status: Abnormal   Collection Time: 05/05/17  2:02 PM  Result Value Ref Range   Sodium 132 (L) 135 - 145 mmol/L   Potassium 4.0 3.5 - 5.1 mmol/L   Chloride 100 (L) 101 -  111 mmol/L   CO2 24 22 - 32 mmol/L   Glucose, Bld 89 65 - 99 mg/dL   BUN 12 6 - 20 mg/dL   Creatinine, Ser 0.68 0.44 - 1.00 mg/dL   Calcium 9.0 8.9 - 10.3 mg/dL   Total Protein 7.6 6.5 - 8.1 g/dL   Albumin 2.9 (L) 3.5 - 5.0 g/dL   AST 56 (H) 15 - 41 U/L   ALT 23 14 - 54 U/L   Alkaline Phosphatase 176 (H) 38 - 126 U/L   Total Bilirubin 1.3 (H) 0.3 - 1.2 mg/dL   GFR calc non Af Amer >60 >60 mL/min   GFR calc Af Amer >60 >60 mL/min    Comment: (NOTE) The eGFR has been calculated using the CKD EPI equation. This calculation has not been validated in all clinical situations. eGFR's persistently <60 mL/min signify possible Chronic Kidney Disease.    Anion gap 8 5 - 15  Comprehensive metabolic panel     Status: Abnormal   Collection Time: 05/19/17 10:54 AM  Result Value Ref Range   Sodium 136 135 - 145 mmol/L   Potassium 4.0 3.5 - 5.1 mmol/L   Chloride 103 101 -  111 mmol/L   CO2 26 22 - 32 mmol/L   Glucose, Bld 98 65 - 99 mg/dL   BUN 16 6 - 20 mg/dL   Creatinine, Ser 0.60 0.44 - 1.00 mg/dL   Calcium 8.9 8.9 - 10.3 mg/dL   Total Protein 7.6 6.5 - 8.1 g/dL   Albumin 3.0 (L) 3.5 - 5.0 g/dL   AST 63 (H) 15 - 41 U/L   ALT 32 14 - 54 U/L   Alkaline Phosphatase 190 (H) 38 - 126 U/L   Total Bilirubin 1.0 0.3 - 1.2 mg/dL   GFR calc non Af Amer >60 >60 mL/min   GFR calc Af Amer >60 >60 mL/min    Comment: (NOTE) The eGFR has been calculated using the CKD EPI equation. This calculation has not been validated in all clinical situations. eGFR's persistently <60 mL/min signify possible Chronic Kidney Disease.    Anion gap 7 5 - 15  CBC with Differential     Status: Abnormal   Collection Time: 05/19/17 10:54 AM  Result Value Ref Range   WBC 2.3 (L) 3.6 - 11.0 K/uL   RBC 3.47 (L) 3.80 - 5.20 MIL/uL   Hemoglobin 10.6 (L) 12.0 - 16.0 g/dL   HCT 31.8 (L) 35.0 - 47.0 %   MCV 91.6 80.0 - 100.0 fL   MCH 30.6 26.0 - 34.0 pg   MCHC 33.5 32.0 - 36.0 g/dL   RDW 22.8 (H) 11.5 - 14.5 %    Platelets 164 150 - 440 K/uL   Neutrophils Relative % 60 %   Neutro Abs 1.4 1.4 - 6.5 K/uL   Lymphocytes Relative 20 %   Lymphs Abs 0.5 (L) 1.0 - 3.6 K/uL   Monocytes Relative 11 %   Monocytes Absolute 0.3 0.2 - 0.9 K/uL   Eosinophils Relative 8 %   Eosinophils Absolute 0.2 0 - 0.7 K/uL   Basophils Relative 1 %   Basophils Absolute 0.0 0 - 0.1 K/uL  CBC with Differential/Platelet     Status: Abnormal   Collection Time: 05/26/17 10:47 AM  Result Value Ref Range   WBC 2.3 (L) 3.6 - 11.0 K/uL   RBC 3.60 (L) 3.80 - 5.20 MIL/uL   Hemoglobin 11.1 (L) 12.0 - 16.0 g/dL   HCT 32.6 (L) 35.0 - 47.0 %   MCV 90.7 80.0 - 100.0 fL   MCH 30.8 26.0 - 34.0 pg   MCHC 34.0 32.0 - 36.0 g/dL   RDW 22.4 (H) 11.5 - 14.5 %   Platelets 173 150 - 440 K/uL   Neutrophils Relative % 46 %   Neutro Abs 1.1 (L) 1.4 - 6.5 K/uL   Lymphocytes Relative 26 %   Lymphs Abs 0.6 (L) 1.0 - 3.6 K/uL   Monocytes Relative 7 %   Monocytes Absolute 0.2 0.2 - 0.9 K/uL   Eosinophils Relative 20 %   Eosinophils Absolute 0.5 0 - 0.7 K/uL   Basophils Relative 1 %   Basophils Absolute 0.0 0 - 0.1 K/uL  Cancer antigen 27.29     Status: None   Collection Time: 05/26/17 10:47 AM  Result Value Ref Range   CA 27.29 15.7 0.0 - 38.6 U/mL    Comment: (NOTE) Bayer Centaur/ACS methodology Performed At: [facility] [place], [place] [id number] Lindon Romp MD [id number]   CBC with Differential/Platelet     Status: Abnormal   Collection Time: 06/01/17 10:47 AM  Result Value Ref Range   WBC 2.1 (L) 3.6 - 11.0  K/uL   RBC 3.99 3.80 - 5.20 MIL/uL   Hemoglobin 12.1 12.0 - 16.0 g/dL   HCT 36.9 35.0 - 47.0 %   MCV 92.5 80.0 - 100.0 fL   MCH 30.3 26.0 - 34.0 pg   MCHC 32.8 32.0 - 36.0 g/dL   RDW 22.4 (H) 11.5 - 14.5 %   Platelets 156 150 - 440 K/uL   Neutrophils Relative % 41 %   Neutro Abs 0.8 (L) 1.4 - 6.5 K/uL   Lymphocytes Relative 29 %   Lymphs Abs 0.6 (L) 1.0 - 3.6 K/uL   Monocytes Relative 16 %    Monocytes Absolute 0.3 0.2 - 0.9 K/uL   Eosinophils Relative 13 %   Eosinophils Absolute 0.3 0 - 0.7 K/uL   Basophils Relative 1 %   Basophils Absolute 0.0 0 - 0.1 K/uL  Comprehensive metabolic panel     Status: Abnormal   Collection Time: 06/01/17 10:47 AM  Result Value Ref Range   Sodium 137 135 - 145 mmol/L   Potassium 3.9 3.5 - 5.1 mmol/L   Chloride 102 101 - 111 mmol/L   CO2 27 22 - 32 mmol/L   Glucose, Bld 77 65 - 99 mg/dL   BUN 14 6 - 20 mg/dL   Creatinine, Ser 0.71 0.44 - 1.00 mg/dL   Calcium 9.1 8.9 - 10.3 mg/dL   Total Protein 8.4 (H) 6.5 - 8.1 g/dL   Albumin 3.2 (L) 3.5 - 5.0 g/dL   AST 48 (H) 15 - 41 U/L   ALT 27 14 - 54 U/L   Alkaline Phosphatase 219 (H) 38 - 126 U/L   Total Bilirubin 0.8 0.3 - 1.2 mg/dL   GFR calc non Af Amer >60 >60 mL/min   GFR calc Af Amer >60 >60 mL/min    Comment: (NOTE) The eGFR has been calculated using the CKD EPI equation. This calculation has not been validated in all clinical situations. eGFR's persistently <60 mL/min signify possible Chronic Kidney Disease.    Anion gap 8 5 - 15     Assessment & Plan  1. URI with cough and congestion - benzonatate (TESSALON PERLES) 100 MG capsule; Take 1 capsule (100 mg total) by mouth 3 (three) times daily as needed for cough.  Dispense: 20 capsule; Refill: 0 - fluticasone (FLONASE) 50 MCG/ACT nasal spray; Place 2 sprays into both nostrils daily.  Dispense: 16 g; Refill: 0 - DG Chest 2 View; Future  2. Decreased breath sounds - DG Chest 2 View; Future - due to patient's current cancer treatment and decreased breath sounds, we will Xray to determine whether PNA is present vs other cause for decreased breath sounds.  - Advised patient to avoid burning trash if at all possible because this likely irritated her airway in addition to developing a URI.  She is in agreement.  3. Essential hypertension Low BP today, along with symptoms of URI - low risk of sepsis, however, we will check CXR today  to ensure no PNA present, and red flags for hypotension and sepsis reviewed with patient as below.  -Red flags and when to present for emergency care or RTC including fever >101.57F, chest pain, shortness of breath, syncope/near syncope, positional lightheadedness, palpitations, malaise/fatigue, new/worsening/un-resolving symptoms, reviewed with patient at time of visit. Follow up and care instructions discussed and provided in AVS.  I have reviewed this encounter including the documentation in this note and/or discussed this patient with the Johney Maine, FNP, NP-C. I am certifying that I agree  with the content of this note as supervising physician.  Steele Sizer, MD [facility] 06/05/2017, 5:21 PM

## 2017-06-08 ENCOUNTER — Ambulatory Visit: Payer: BLUE CROSS/BLUE SHIELD

## 2017-06-09 ENCOUNTER — Other Ambulatory Visit: Payer: Self-pay | Admitting: *Deleted

## 2017-06-09 MED ORDER — OXYCODONE HCL 5 MG PO TABS: 5.0000 mg | ORAL_TABLET | Freq: Three times a day (TID) | ORAL | 0 refills | Status: DC | PRN

## 2017-06-10 ENCOUNTER — Ambulatory Visit: Payer: BLUE CROSS/BLUE SHIELD

## 2017-06-15 ENCOUNTER — Ambulatory Visit: Payer: BLUE CROSS/BLUE SHIELD

## 2017-06-15 ENCOUNTER — Encounter: Payer: BLUE CROSS/BLUE SHIELD | Admitting: Occupational Therapy

## 2017-06-15 DIAGNOSIS — R262 Difficulty in walking, not elsewhere classified: Secondary | ICD-10-CM

## 2017-06-15 DIAGNOSIS — M6281 Muscle weakness (generalized): Secondary | ICD-10-CM

## 2017-06-15 DIAGNOSIS — G620 Drug-induced polyneuropathy: Secondary | ICD-10-CM | POA: Diagnosis not present

## 2017-06-15 DIAGNOSIS — T451X5A Adverse effect of antineoplastic and immunosuppressive drugs, initial encounter: Secondary | ICD-10-CM | POA: Diagnosis not present

## 2017-06-15 DIAGNOSIS — M79641 Pain in right hand: Secondary | ICD-10-CM | POA: Diagnosis not present

## 2017-06-15 DIAGNOSIS — M79642 Pain in left hand: Secondary | ICD-10-CM | POA: Diagnosis not present

## 2017-06-15 NOTE — Patient Instructions (Signed)
  Gave seated R ankle DF with strap assist 10x 2-3 with 5 second holds daily as part of her HEP. Pt demonstrated and verbalized understanding.

## 2017-06-15 NOTE — Therapy (Signed)
[facility] [address], [place], [address] Phone: [phone]   Fax:  [phone]  Physical Therapy Treatment  Patient Details  Name: Sarah Horne MRN: [id number] Date of Birth: [birth year] Referring Provider: Charlaine Dalton, MD  Encounter Date: 06/15/2017      PT End of Session - 06/15/17 1302    Visit Number 15   Number of Visits 22   Date for PT Re-Evaluation 07/01/17   PT Start Time 1302   PT Stop Time 2094   PT Time Calculation (min) 47 min   Equipment Utilized During Treatment --  [facility]   Activity Tolerance Patient tolerated treatment well   Behavior During Therapy [facility] for tasks assessed/performed      Past Medical History:  Diagnosis Date  . Chemotherapy-induced peripheral neuropathy ([place])   . Epistaxis    a. 11/2016 in setting of asa/plavix-->silver nitrate cauterization.  . GI bleed    a. 11/2016 Admission w/ GIB and hypovolemic shock req 3u PRBC's;  b. 11/2016 ECG: gastritis & nonbleeding peptic ulcer; c. 11/2016 Con[place]oscopy: rectal and sigmoid colonic ulcers.  . Heart attack ([place])    a. 1998 Cath @ [facility]: reportedly no intervention required.  Marland Kitchen Herceptin-induced cardiomyopathy ([place])    a. In the setting of Herceptin Rx for breast cancer (initiated 12/2014); b. 03/2015 MUGA EF 64%; b. 08/2015 MUGA: EF 51%; c. 10/2015 MUGA: EF 44%; d. 11/2015 Echo: EF 45-50%; e. 01/2016 MUGA: EF 60%; f. 06/2016 MUGA EF 65%; g. 10/2016 MUGA: EF 61%;  h. 12/2016 Echo: EF 55-60%, gr1 DD.  Marland Kitchen Hyperlipidemia   . Hypertension   . Neuropathy   . Possible PAD (peripheral artery disease) ([place])    a. 11/2016 LE cyanosis and weak pulses-->CTA w/o significant Ao-BiFem dzs. ? distal dzs-->ASA/Plavix initiated by vascular surgery.  Marland Kitchen PSVT (paroxysmal supraventricular tachycardia) ([place])    a. Dx 11/2016.  . Pulmonary embolism ([place])    a. 12/2016 CTA Chest: small nonocclusive PE in inferior segment of the Left lingula, somewhat  eccentric filling defect suggesting chronic rather than acute embolic event; b. 03/961 LE U/S:  No DVT; c. 12/2016 Echo: Nl RV fxn, nl PASP.  Marland Kitchen Recurrent Metastatic breast cancer ([place])    a. Dx 2016: Stage II, ER positive, PR positive, HER-2/neu overexpressing of the left breast-->chemo/radiation; b. 10/2016 CT Abd/pelvis: diffuse liver mets, ill defined sclerotic bone lesions-T12;  c. 10/2016 MRI brain: metastatic lesion along L temporal lobe (19x35m) w/ extensive surrounding edema & 572mmidline shift to right.  . Sinus tachycardia     Past Surgical History:  Procedure Laterality Date  . BREAST BIOPSY Left 2016   Positive  . BREAST LUMPECTOMY WITH SENTINEL LYMPH NODE BIOPSY Left 05/23/2015   Procedure: LEFT BREAST WIDE EXCISION WITH AXILLARY DISSECTION, MASTOPLASTY ;  Surgeon: JeRobert BellowMD;  Location: [facility] ORS;  Service: General;  Laterality: Left;  . BREAST SURGERY Left 12/18/14   breast biopsy/INVASIVE DUCTAL CARCINOMA OF BREAST, NOTTINGHAM GRADE 2.  . Marland KitchenREAST SURGERY  05/23/2015.   Wide excision/mastoplasty, axillary dissection. No residual invasive cancer, positive for residual DCIS. 0/2 nodes identified on axillary dissection. (no SLN by technetium or methylene blue)  . CARDIAC CATHETERIZATION    . COLONOSCOPY WITH PROPOFOL N/A 12/10/2016   Procedure: COLONOSCOPY WITH PROPOFOL;  Surgeon: DaLucilla LameMD;  Location: [facility] ENDOSCOPY;  Service: Endoscopy;  Laterality: N/A;  . COLONOSCOPY WITH PROPOFOL N/A 02/13/2017   Procedure: COLONOSCOPY WITH PROPOFOL;  Surgeon: WoAllen Norris  Darren, MD;  Location: [facility];  Service: Endoscopy;  Laterality: N/A;  . ESOPHAGOGASTRODUODENOSCOPY (EGD) WITH PROPOFOL N/A 12/08/2016   Procedure: ESOPHAGOGASTRODUODENOSCOPY (EGD) WITH PROPOFOL;  Surgeon: Lucilla Lame, MD;  Location: [facility] END[facility];  Service: Endoscopy;  Laterality: N/A;  . IVC FILTER INSERTION N/A 02/15/2017   Procedure: IVC Filter Insertion;  Surgeon: Algernon Huxley, MD;  Location: [facility];  Service: Cardiovascular;  Laterality: N/A;  . PORTACATH PLACEMENT Right 12-31-14   Dr Bary Castilla    There were no vitals filed for this visit.      Subjective Assessment - 06/15/17 1304    Subjective Pt states no pain or discomfort other than her hands or feet. Walking is fine.   Pertinent History Bilateral UE and LE weakness. Pt states that she feels like her weakness came from her neuropathy. Difficulty writing. Both hands feel numb currently but can feel her hand when she touches it. Has had the weakness for about 3 months (since April 2018) which began gradually.  Went through chemo and radiation last year for about 2.5 months until August 2017.  Still has cancer in her L breast which also traveled to her brain and liver.   Pt also lost 10 lbs since she was diagnosed with cancer 2 years ago.  Apetite is fine. Normally eats breakfast at times. Normally eats one good meal during the day and eats junk in between such as cookies and sodas.  Eats a frozen dinner during lunch. Does not know if she gets enough protein.   Only has pain in her feet and hands bilaterally.  No neck or back pain.   Has been using a rw since May 2018.  Was independent with gait before May 2018.  Pt goal is to improve her function.    Patient Stated Goals I want to be able to get out of the walker and not use a cane. Be able to do all the things she used to do such as cooking a meal, laundry,    Currently in Pain? Other (Comment)  no pain or discomfort other than hands or feet                                 PT Education - 06/15/17 1314    Education provided Yes   Education Details ther-ex, HEP   Person(s) Educated Patient   Methods Explanation;Demonstration;Tactile cues;Verbal cues   Comprehension Returned demonstration;Verbalized understanding        Objectives   There-ex  Blood pressure R arm sitting, mechanically taken: 110/65, HR 105 at start of session. SpO2 98% room  Air  Ankle DF on rocker board with bilateral UE assist 2 min with 5 second gastroc stretch.   standing mini static forward lunge with R foot back with R UE to no UE assist  10x3 to promote ankle DF  Standing heel toe raises with bilateral UE assist 10x3  Static standing forward mini lunge with R LE 10x3 to promote ankle DF  Seated R ankle DF with PT assist to end range 10x2 with 5 second holds at end range    Reviewed and given as part of her HEP using strap assist. Pt demonstrated and verbalized understanding.    standing bilateral calf stretch at stair step with bilateral UE assist 30 seconds x 3  Side step with mini squat 5 ft to the L and 5 ft to the R with  bilateral UE assist to promote LE strength and R ankle DF 5x  standing leg press resisting double blue band 10x3 each LE with bilateral UE assist   Standing low rows resisting red band 10x5 seconds for 3 sets    Improved exercise technique, movement at target joints, use of target muscles after min to mod verbal, visual, tactile cues.    Continued working on R ankle DF ROM and increasing activation of R tibialis anterior muscle to promote R ankle DF during swing phase of gait. Pt tolerated session well without aggravation of symptoms.              PT Long Term Goals - 06/01/17 1400      PT LONG TERM GOAL #1   Title Patient will be able to perform her TUG time using a rw to 18 seconds or less to promote functional mobility.   Baseline 25.3 seconds average with rw (04/20/2017); 12 seconds on average with SPC (05/20/2017)   Time 6   Period Weeks   Status Achieved     PT LONG TERM GOAL #2   Title Patient will improve bilateral UE and bilateral hip, and knee strength to promote ability to perform functional tasks at home.    Time 4   Period Weeks   Status On-going   Target Date 07/01/17     PT LONG TERM GOAL #3   Title Patient will improve her LEFS score by at least 9 points as a demonstration of improved  function.    Baseline 25/80 (04/20/2017); 57/80 (05/20/2017)   Time 6   Period Weeks   Status Achieved     PT LONG TERM GOAL #4   Title Patient will improve her Quick Dash Disability/Symptom score by at least 15% as a demonstration of improved function.    Baseline 70.45% (04/20/2017); 25% (05/20/2017)   Time 6   Period Weeks   Status Achieved     PT LONG TERM GOAL #5   Title Patient will be able to perform her TUG time without AD to 12 seconds or less to promote functional mobility.   Baseline 12 seconds with SPC (05/20/2017); 10 seconds average without AD (06/01/2017)   Time 4   Period Weeks   Status Achieved     Additional Long Term Goals   Additional Long Term Goals Yes     PT LONG TERM GOAL #6   Title Pt will be able to ambulate at least 6 minutes without AD with SBA and no LOB to promote mobility and function.    Baseline able to ambulate 400 ft without AD with CGA, no LOB (05/20/2017); able to ambulate 1042 ft in 6 min without AD and with SBA, no LOB (06/01/2017)    Time 4   Period Weeks   Status Achieved     PT LONG TERM GOAL #7   Title Pt will improve supine R ankle DF AROM to - 17 degrees or less to promote foot clearance during gait.     Baseline -28 degrees (06/01/2017)   Time 4   Period Weeks   Status New   Target Date 07/01/17     PT LONG TERM GOAL #8   Title Pt will improve her BERG balance score to 47/56 or more as a demonstration of improved balance.    Baseline 42/56 (05/25/2017)   Time 4   Period Days   Status New   Target Date 07/01/17  Plan - 06/15/17 1257    Clinical Impression Statement Continued working on R ankle DF ROM and increasing activation of R tibialis anterior muscle to promote R ankle DF during swing phase of gait. Pt tolerated session well without aggravation of symptoms.    History and Personal Factors relevant to plan of care: hx of CA, weakness, multiple health problems   Clinical Presentation Stable   Clinical  Presentation due to: No complain of increased pain throughout session.    Clinical Decision Making Low   Rehab Potential Fair   Clinical Impairments Affecting Rehab Potential hx of CA, multiple health problems   PT Frequency 2x / week   PT Duration 6 weeks   PT Treatment/Interventions Manual techniques;Patient/family education;Therapeutic activities;Therapeutic exercise;Gait training;Neuromuscular re-education;Balance training   PT Next Visit Plan hip, scapular, LE, UE strengthening, gait, balance, femoral control   Consulted and Agree with Plan of Care Patient      Patient will benefit from skilled therapeutic intervention in order to improve the following deficits and impairments:  Pain, Improper body mechanics, Postural dysfunction, Impaired UE functional use, Decreased strength, Difficulty walking, Decreased endurance  Visit Diagnosis: Muscle weakness (generalized)  Difficulty in walking, not elsewhere classified     Problem List Patient Active Problem List   Diagnosis Date Noted  . Brain metastasis ([place]) 05/19/2017  . Blood in stool   . Polyp of sigmoid colon   . Benign neoplasm of transverse colon   . Benign neoplasm of ascending colon   . Lower GI bleed   . Weakness of right lower extremity 02/11/2017  . Orthostatic lightheadedness 01/14/2017  . Atrial tachycardia ([place]) 01/14/2017  . Hypokalemia 01/14/2017  . Pulmonary emboli ([place]) 01/12/2017  . Dehydration 12/25/2016  . Blood loss anemia 12/15/2016  . Ulceration of colon   . Heartburn   . Duodenitis   . Gastritis without bleeding   . GI bleed   . Acute esophagitis   . Acute esophagogastric ulcer   . Rectal bleed   . Hypovolemic shock ([place])   . Ischemic leg 12/04/2016  . Chemotherapy induced neutropenia ([place]) 11/20/2016  . Palliative care by specialist   . Goals of care, counseling/discussion   . DNR (do not resuscitate) discussion   . Cerebral edema ([place]) 11/10/2016  . Encounter for monitoring cardiotoxic  drug therapy 11/09/2016  . Elevated LFTs 10/20/2016  . URI with cough and congestion 08/25/2016  . Peripheral neuropathy due to chemotherapy ([place]) 03/13/2016  . Encounter for antineoplastic chemotherapy 03/13/2016  . HLD (hyperlipidemia) 01/29/2016  . Hyperglycemia, unspecified 01/29/2016  . Cardiomyopathy ([place]) 01/13/2016  . Hypertension 07/18/2015  . Carcinoma of upper-outer quadrant of left breast in female, estrogen receptor positive ([place]) 12/27/2014    Joneen Boers PT, DPT   06/15/2017, 7:16 PM  [facility] [address], [place], [address] Phone: [phone]   Fax:  [phone]  Name: Sarah Horne MRN: [id number] Date of Birth: 02-Nov-1955

## 2017-06-16 ENCOUNTER — Inpatient Hospital Stay: Payer: BLUE CROSS/BLUE SHIELD

## 2017-06-16 ENCOUNTER — Inpatient Hospital Stay: Payer: BLUE CROSS/BLUE SHIELD | Attending: Internal Medicine | Admitting: Internal Medicine

## 2017-06-16 VITALS — BP 124/85 | HR 114 | Temp 97.6°F | Resp 20 | Wt 137.4 lb

## 2017-06-16 DIAGNOSIS — I252 Old myocardial infarction: Secondary | ICD-10-CM

## 2017-06-16 DIAGNOSIS — I471 Supraventricular tachycardia: Secondary | ICD-10-CM | POA: Diagnosis not present

## 2017-06-16 DIAGNOSIS — Z803 Family history of malignant neoplasm of breast: Secondary | ICD-10-CM | POA: Diagnosis not present

## 2017-06-16 DIAGNOSIS — D709 Neutropenia, unspecified: Secondary | ICD-10-CM

## 2017-06-16 DIAGNOSIS — C7931 Secondary malignant neoplasm of brain: Secondary | ICD-10-CM | POA: Diagnosis not present

## 2017-06-16 DIAGNOSIS — G629 Polyneuropathy, unspecified: Secondary | ICD-10-CM | POA: Insufficient documentation

## 2017-06-16 DIAGNOSIS — Z8719 Personal history of other diseases of the digestive system: Secondary | ICD-10-CM

## 2017-06-16 DIAGNOSIS — Z86711 Personal history of pulmonary embolism: Secondary | ICD-10-CM

## 2017-06-16 DIAGNOSIS — C787 Secondary malignant neoplasm of liver and intrahepatic bile duct: Secondary | ICD-10-CM

## 2017-06-16 DIAGNOSIS — C50412 Malignant neoplasm of upper-outer quadrant of left female breast: Secondary | ICD-10-CM | POA: Diagnosis not present

## 2017-06-16 DIAGNOSIS — E785 Hyperlipidemia, unspecified: Secondary | ICD-10-CM | POA: Diagnosis not present

## 2017-06-16 DIAGNOSIS — Z79818 Long term (current) use of other agents affecting estrogen receptors and estrogen levels: Secondary | ICD-10-CM | POA: Diagnosis not present

## 2017-06-16 DIAGNOSIS — F17210 Nicotine dependence, cigarettes, uncomplicated: Secondary | ICD-10-CM | POA: Insufficient documentation

## 2017-06-16 DIAGNOSIS — Z170 Estrogen receptor positive status [ER+]: Secondary | ICD-10-CM

## 2017-06-16 DIAGNOSIS — I10 Essential (primary) hypertension: Secondary | ICD-10-CM

## 2017-06-16 DIAGNOSIS — E860 Dehydration: Secondary | ICD-10-CM

## 2017-06-16 DIAGNOSIS — Z79899 Other long term (current) drug therapy: Secondary | ICD-10-CM | POA: Insufficient documentation

## 2017-06-16 DIAGNOSIS — M8580 Other specified disorders of bone density and structure, unspecified site: Secondary | ICD-10-CM | POA: Diagnosis not present

## 2017-06-16 LAB — COMPREHENSIVE METABOLIC PANEL WITH GFR
ALT: 24 U/L (ref 14–54)
AST: 41 U/L (ref 15–41)
Albumin: 3.5 g/dL (ref 3.5–5.0)
Alkaline Phosphatase: 167 U/L — ABNORMAL HIGH (ref 38–126)
Anion gap: 8 (ref 5–15)
BUN: 18 mg/dL (ref 6–20)
CO2: 28 mmol/L (ref 22–32)
Calcium: 9.6 mg/dL (ref 8.9–10.3)
Chloride: 100 mmol/L — ABNORMAL LOW (ref 101–111)
Creatinine, Ser: 0.81 mg/dL (ref 0.44–1.00)
GFR calc Af Amer: >60 mL/min
GFR calc non Af Amer: >60 mL/min
Glucose, Bld: 85 mg/dL (ref 65–99)
Potassium: 4.5 mmol/L (ref 3.5–5.1)
Sodium: 136 mmol/L (ref 135–145)
Total Bilirubin: 1.4 mg/dL — ABNORMAL HIGH (ref 0.3–1.2)
Total Protein: 8.6 g/dL — ABNORMAL HIGH (ref 6.5–8.1)

## 2017-06-16 LAB — CBC WITH DIFFERENTIAL/PLATELET
Basophils Absolute: 0 K/uL (ref 0–0.1)
Basophils Relative: 1 %
Eosinophils Absolute: 0.1 K/uL (ref 0–0.7)
Eosinophils Relative: 4 %
HCT: 33.6 % — ABNORMAL LOW (ref 35.0–47.0)
Hemoglobin: 11.5 g/dL — ABNORMAL LOW (ref 12.0–16.0)
Lymphocytes Relative: 33 %
Lymphs Abs: 0.6 K/uL — ABNORMAL LOW (ref 1.0–3.6)
MCH: 31.4 pg (ref 26.0–34.0)
MCHC: 34.2 g/dL (ref 32.0–36.0)
MCV: 91.8 fL (ref 80.0–100.0)
Monocytes Absolute: 0.1 K/uL — ABNORMAL LOW (ref 0.2–0.9)
Monocytes Relative: 8 %
Neutro Abs: 0.9 K/uL — ABNORMAL LOW (ref 1.4–6.5)
Neutrophils Relative %: 54 %
Platelets: 159 K/uL (ref 150–440)
RBC: 3.66 MIL/uL — ABNORMAL LOW (ref 3.80–5.20)
RDW: 23.1 % — ABNORMAL HIGH (ref 11.5–14.5)
WBC: 1.7 K/uL — ABNORMAL LOW (ref 3.6–11.0)

## 2017-06-16 MED ORDER — FULVESTRANT 250 MG/5ML IM SOLN
500.0000 mg | Freq: Once | INTRAMUSCULAR | Status: AC
  Administered 2017-06-16: 500 mg via INTRAMUSCULAR
  Filled 2017-06-16: qty 10

## 2017-06-16 NOTE — Assessment & Plan Note (Addendum)
#  RECURRENT Metastatic breast cancer- ER/PR positive HER-2/neu negative [status post liver biopsy February 2018; previously HER-2/neu positive]. Currently on palliative Faslodex + Abemaciclib re-started at 50mg/day/  No evidence of clinical progression.  # Clinically improving; AUG 6th 2018- CT scan shows improvement of the liver lesions; tumor markers improving. Today white count is  1.9 ANC 900; Hb-11; platelets Normal.   # Continue Abemaciclib at 50mg/day.  Neutropenia ANC 900; however asymptomatic. Continue Abema at the current dose. Stop arimidex.   # Brain mets- s/p RT [finished Feb 28th]; symptomatic improvement.  schedule MRI asap.  # PN G-1-2/ improving secondary to chemotherapy. Continue Lyrica;.  Continue oxycodone once a day. Patient continues to be on physical therapy.  # faslodex today; follow up in 4 weeks/labs/MRI prior/faslodex again. 

## 2017-06-16 NOTE — Progress Notes (Signed)
Patient here for follow-up for breast cancer. She has no medical complaints. She is currently Abemaciclib. She is currently tolerating this medication without any complaints. She has not missed any dosing.

## 2017-06-16 NOTE — Progress Notes (Signed)
[facility] OFFICE PROGRESS NOTE  Patient Care Team: [name], MD as PCP - General (Family Medicine) Bary Castilla, Forest Gleason, MD (General Surgery) [name], MD (Inactive) as Attending Physician (Internal Medicine)  No matching staging information was found for the patient.   Oncology History   # LEFT BREAST IDC; STAGE II [cT2N1] ER >90%; PR- 50-90%; her 2 NEU- POS; s/p Neoadj chemo; AUG 2016- TCH+P s/p Lumpec & partial ALND- path CR; s/p RT [finished Nov 2016];  adj Herceptin; HELD for Jan 19th 2017 [in DEC 2016-EF dropped from 63 to 51%; FEB 27th EF-42.9%]; JAN 2016 START Arimidex; May 2017- EF- 60%; May 24th 2017-Re-start Herceptin q 3W; July 28th STOP herceptin [finished 2m/m2; sec to Low EF; however 2017 Oct EF= 67%; improved]  # DEC 4th 2017- Start Neratinib 4 pills; DEC 11th 3 pills; STOPPED.  # FEB 2018- RECURRENCE ER/PR positive; Her 2 NEGATIVE [liver Bx]  # MARCH 1st 2018- Tax-Cytoxan [poor tol]  # FEB 2018- Brain mets [SBRT; Dr.Crystal; finished Feb 28th 2018]  # March 2018- Eribulin- poor tolerance  # June 8th 2018- Started Faslodex + Abema [abema-multiple interruptions sec to neutropenia]  # PN- G-2; may 2017- Cymbalta 65md;MARCH 2018-  acute vascular insuff of BIL LE [? Taxotere vasoconstriction on asprin]; # hemorragic shock LGIB [s/p colo; Dr.Wohl]    # April 2016- Liver Bx- NEG;   # Drop in EF from Herceptin [recovered OCt 27th- EF 65%]  # BMD- jan 2017- osteopenia  -----------------------------------------------------------     MODe S[place]one- Sep 4th 2018- PDL-1 0%/pending.       Carcinoma of upper-outer quadrant of left breast in female, estrogen receptor positive ([place]    INTERVAL HISTORY:  A very pleasant [age]ear old female patient with above history of Recurrent stage IV ER/PR positive HER-2/neu NEGATIVE [status post liver biopsy February 2018] - is currently on Faslodex-Abemaciclib [ambema  At  [id number]] is here for follow-up.  Patient denies any headaches. Denies any nausea vomiting diarrhea. Denies any fevers or chills. She continues to work with physical therapy she is currently walking with a cane.  No falls. Patient has not had any recent hospitalizations. No fevers or chills. Appetite improving. Weight gain. No abdominal distention or swelling in the legs.  REVIEW OF SYSTEMS:  A complete 10 point review of system is done which is negative except mentioned above/history of present illness.   PAST MEDICAL HISTORY :  Past Medical History:  Diagnosis Date  . Chemotherapy-induced peripheral neuropathy ([place] . Epistaxis    a. 11/2016 in setting of asa/plavix-->silver nitrate cauterization.  . GI bleed    a. 11/2016 Admission w/ GIB and hypovolemic shock req 3u PRBC's;  b. 11/2016 ECG: gastritis & nonbleeding peptic ulcer; c. 11/2016 Conlonoscopy: rectal and sigmoid colonic ulcers.  . Heart attack ([place]  a. 1998 Cath @ [facility]: reportedly no intervention required.  . HMarland Kitchenrceptin-induced cardiomyopathy ([facility]CT[place]  a. In the setting of Herceptin Rx for breast cancer (initiated 12/2014); b. 03/2015 MUGA EF 64%; b. 08/2015 MUGA: EF 51%; c. 10/2015 MUGA: EF 44%; d. 11/2015 Echo: EF 45-50%; e. 01/2016 MUGA: EF 60%; f. 06/2016 MUGA EF 65%; g. 10/2016 MUGA: EF 61%;  h. 12/2016 Echo: EF 55-60%, gr1 DD.  . HMarland Kitchenperlipidemia   . Hypertension   . Neuropathy   . Possible PAD (peripheral artery disease) ([facility]  a. 11/2016 LE cyanosis and weak pulses-->CTA w/o significant Ao-BiFem dzs. ? distal dzs-->ASA/Plavix  initiated by vascular surgery.  Marland Kitchen PSVT (paroxysmal supraventricular tachycardia) ([place])    a. Dx 11/2016.  . Pulmonary embolism ([place])    a. 12/2016 CTA Chest: small nonocclusive PE in inferior segment of the Left lingula, somewhat eccentric filling defect suggesting chronic rather than acute embolic event; b. 02/2262 LE U/S:  No DVT; c. 12/2016 Echo: Nl RV fxn, nl PASP.  Marland Kitchen Recurrent Metastatic breast cancer  ([place])    a. Dx 2016: Stage II, ER positive, PR positive, HER-2/neu overexpressing of the left breast-->chemo/radiation; b. 10/2016 CT Abd/pelvis: diffuse liver mets, ill defined sclerotic bone lesions-T12;  c. 10/2016 MRI brain: metastatic lesion along L temporal lobe (19x82m) w/ extensive surrounding edema & 581mmidline shift to right.  . Sinus tachycardia     PAST SURGICAL HISTORY :   Past Surgical History:  Procedure Laterality Date  . BREAST BIOPSY Left 2016   Positive  . BREAST LUMPECTOMY WITH SENTINEL LYMPH NODE BIOPSY Left 05/23/2015   Procedure: LEFT BREAST WIDE EXCISION WITH AXILLARY DISSECTION, MASTOPLASTY ;  Surgeon: JeRobert BellowMD;  Location: [facility] ORS;  Service: General;  Laterality: Left;  . BREAST SURGERY Left 12/18/14   breast biopsy/INVASIVE DUCTAL CARCINOMA OF BREAST, NOTTINGHAM GRADE 2.  . Marland KitchenREAST SURGERY  05/23/2015.   Wide excision/mastoplasty, axillary dissection. No residual invasive cancer, positive for residual DCIS. 0/2 nodes identified on axillary dissection. (no SLN by technetium or methylene blue)  . CARDIAC CATHETERIZATION    . COLONOSCOPY WITH PROPOFOL N/A 12/10/2016   Procedure: COLONOSCOPY WITH PROPOFOL;  Surgeon: DaLucilla LameMD;  Location: [facility] ENDOSCOPY;  Service: Endoscopy;  Laterality: N/A;  . COLONOSCOPY WITH PROPOFOL N/A 02/13/2017   Procedure: COLONOSCOPY WITH PROPOFOL;  Surgeon: WoLucilla LameMD;  [facility];  Service: Endoscopy;  Laterality: N/A;  . ESOPHAGOGASTRODUODENOSCOPY (EGD) WITH PROPOFOL N/A 12/08/2016   Procedure: ESOPHAGOGASTRODUODENOSCOPY (EGD) WITH PROPOFOL;  Surgeon: DaLucilla LameMD;  Location: [facility] END[facility]COPY;  Service: Endoscopy;  Laterality: N/A;  . IVC FILTER INSERTION N/A 02/15/2017   Procedure: IVC Filter Insertion;  Surgeon: DeAlgernon HuxleyMD;  [facility];  Service: Cardiovascular;  Laterality: N/A;  . PORTACATH PLACEMENT Right 12-31-14   Dr ByBary Castilla  FAMILY HISTORY :   Family History  Problem  Relation Age of Onset  . Breast cancer Maternal Aunt   . Breast cancer Cousin   . Brain cancer Maternal Uncle   . Diabetes Mother   . Hypertension Mother   . Stroke Mother     SOCIAL HISTORY:   Social History  Substance Use Topics  . Smoking status: Light Tobacco Smoker    Packs/day: 0.50    Years: 18.00    Types: Cigarettes  . Smokeless tobacco: Never Used  . Alcohol use No    ALLERGIES:  is allergic to no known allergies.  MEDICATIONS:  Current Outpatient Prescriptions  Medication Sig Dispense Refill  . Abemaciclib 50 MG TABS Take 1 tablet by mouth daily. DO NOT START Until seen by MD. 30 tablet 3  . anastrozole (ARIMIDEX) 1 MG tablet Take 1 tablet (1 mg total) by mouth daily. (Patient not taking: Reported on 06/17/2017) 90 tablet 3  . feeding supplement, ENSURE ENLIVE, (ENSURE ENLIVE) LIQD Take 237 mLs by mouth 2 (two) times daily between meals. 237 mL 12  . fluticasone (FLONASE) 50 MCG/ACT nasal spray Place 2 sprays into both nostrils daily. 16 g 0  . magnesium oxide (MAG-OX) 400 MG tablet Take 1 tablet (400 mg total) by  mouth 2 (two) times daily. 60 tablet 6  . oxyCODONE (OXY IR/ROXICODONE) 5 MG immediate release tablet Take 1 tablet (5 mg total) by mouth every 8 (eight) hours as needed for severe pain. 42 tablet 0  . potassium chloride SA (K-DUR,KLOR-CON) 20 MEQ tablet Take 1 tablet (20 mEq total) by mouth daily. 90 tablet 3  . pregabalin (LYRICA) 75 MG capsule Take 1 capsule (75 mg total) by mouth 2 (two) times daily. 60 capsule 3  . diphenoxylate-atropine (LOMOTIL) 2.5-0.025 MG tablet Take 1 tablet by mouth 4 (four) times daily as needed for diarrhea or loose stools. Take it along with immodium (Patient not taking: Reported on 06/16/2017) 60 tablet 0  . ondansetron (ZOFRAN) 8 MG tablet Take 1 tablet (8 mg total) by mouth every 8 (eight) hours as needed for nausea or vomiting (start 3 days; after chemo). (Patient not taking: Reported on 06/16/2017) 40 tablet 1   No current  facility-administered medications for this visit.    Facility-Administered Medications Ordered in Other Visits  Medication Dose Route Frequency Provider Last Rate Last Dose  . 0.9 %  sodium chloride infusion   Intravenous Continuous Pandit, Sandeep, MD      . 0.9 %  sodium chloride infusion   Intravenous Continuous Lloyd Huger, MD 999 mL/hr at 04/24/15 1520    . heparin lock flush 100 unit/mL  500 Units Intravenous Once Berenzon, Dmitriy, MD      . sodium chloride 0.9 % injection 10 mL  10 mL Intravenous PRN [name], MD   10 mL at 04/04/15 1440  . sodium chloride flush (NS) 0.9 % injection 10 mL  10 mL Intravenous PRN Berenzon, Dmitriy, MD      . Tbo-Filgrastim (GRANIX) injection 480 mcg  480 mcg Subcutaneous Once Cammie Sickle, MD        PHYSICAL EXAMINATION: ECOG PERFORMANCE STATUS: 0 - Asymptomatic  BP 124/85 (BP Location: Left Arm, Patient Position: Sitting)   Pulse (!) 114   Temp 97.6 F (36.4 C) (Tympanic)   Resp 20   Wt 137 lb 6.4 oz (62.3 kg)   BMI 22.18 kg/m   Filed Weights   06/16/17 0956  Weight: 137 lb 6.4 oz (62.3 kg)    GENERAL: Well-nourished well-developed; Alert, no distress and comfortable. Accompanied by her brother.  She is walking with a cane. EYES: no pallor. No jaundice. She is in a wheelchair  OROPHARYNX: No  ulceration; good dentition ; NECK: supple, no masses felt LYMPH:  no palpable lymphadenopathy in the cervical, axillary or inguinal regions LUNGS: clear to auscultation and  No wheeze or crackles HEART/CVS: regular rate & rhythm and no murmurs; NO bilateral lower extremity edema.  ABDOMEN:abdomen soft, non-tender and normal bowel sounds Musculoskeletal:no cyanosis of digits and no clubbing  PSYCH: alert & oriented x 3 with fluent speech NEURO: no focal motor/sensory deficits- except for foot drop on the right side.   LABORATORY DATA:  I have reviewed the data as listed    Component Value Date/Time   NA 136 06/16/2017  0913   NA 135 03/04/2017 0918   NA 135 01/08/2015 0850   K 4.5 06/16/2017 0913   K 3.7 01/08/2015 0850   CL 100 (L) 06/16/2017 0913   CL 101 01/08/2015 0850   CO2 28 06/16/2017 0913   CO2 26 01/08/2015 0850   GLUCOSE 85 06/16/2017 0913   GLUCOSE 161 (H) 01/08/2015 0850   BUN 18 06/16/2017 0913   BUN 8 03/04/2017 1224  BUN 17 01/08/2015 0850   CREATININE 0.81 06/16/2017 0913   CREATININE 0.82 01/22/2015 1559   CALCIUM 9.6 06/16/2017 0913   CALCIUM 9.3 01/08/2015 0850   PROT 8.6 (H) 06/16/2017 0913   PROT 6.4 03/04/2017 0918   PROT 6.8 01/22/2015 1559   ALBUMIN 3.5 06/16/2017 0913   ALBUMIN 2.5 (L) 03/04/2017 0918   ALBUMIN 3.9 01/22/2015 1559   AST 41 06/16/2017 0913   AST 34 01/22/2015 1559   ALT 24 06/16/2017 0913   ALT 42 01/22/2015 1559   ALKPHOS 167 (H) 06/16/2017 0913   ALKPHOS 157 (H) 01/22/2015 1559   BILITOT 1.4 (H) 06/16/2017 0913   BILITOT 6.2 (H) 03/04/2017 0918   BILITOT 0.2 (L) 01/22/2015 1559   GFRNONAA >60 06/16/2017 0913   GFRNONAA >60 01/22/2015 1559   GFRAA >60 06/16/2017 0913   GFRAA >60 01/22/2015 1559    No results found for: SPEP, UPEP  Lab Results  Component Value Date   WBC 1.7 (L) 06/16/2017   NEUTROABS 0.9 (L) 06/16/2017   HGB 11.5 (L) 06/16/2017   HCT 33.6 (L) 06/16/2017   MCV 91.8 06/16/2017   PLT 159 06/16/2017      Chemistry      Component Value Date/Time   NA 136 06/16/2017 0913   NA 135 03/04/2017 0918   NA 135 01/08/2015 0850   K 4.5 06/16/2017 0913   K 3.7 01/08/2015 0850   CL 100 (L) 06/16/2017 0913   CL 101 01/08/2015 0850   CO2 28 06/16/2017 0913   CO2 26 01/08/2015 0850   BUN 18 06/16/2017 0913   BUN 8 03/04/2017 0918   BUN 17 01/08/2015 0850   CREATININE 0.81 06/16/2017 0913   CREATININE 0.82 01/22/2015 1559      Component Value Date/Time   CALCIUM 9.6 06/16/2017 0913   CALCIUM 9.3 01/08/2015 0850   ALKPHOS 167 (H) 06/16/2017 0913   ALKPHOS 157 (H) 01/22/2015 1559   AST 41 06/16/2017 0913   AST 34  01/22/2015 1559   ALT 24 06/16/2017 0913   ALT 42 01/22/2015 1559   BILITOT 1.4 (H) 06/16/2017 0913   BILITOT 6.2 (H) 03/04/2017 0918   BILITOT 0.2 (L) 01/22/2015 1559            LABORATORY DATA:  I have reviewed the data as listed    Component Value Date/Time   NA 136 06/16/2017 0913   NA 135 03/04/2017 0918   NA 135 01/08/2015 0850   K 4.5 06/16/2017 0913   K 3.7 01/08/2015 0850   CL 100 (L) 06/16/2017 0913   CL 101 01/08/2015 0850   CO2 28 06/16/2017 0913   CO2 26 01/08/2015 0850   GLUCOSE 85 06/16/2017 0913   GLUCOSE 161 (H) 01/08/2015 0850   BUN 18 06/16/2017 0913   BUN 8 03/04/2017 0918   BUN 17 01/08/2015 0850   CREATININE 0.81 06/16/2017 0913   CREATININE 0.82 01/22/2015 1559   CALCIUM 9.6 06/16/2017 0913   CALCIUM 9.3 01/08/2015 0850   PROT 8.6 (H) 06/16/2017 0913   PROT 6.4 03/04/2017 0918   PROT 6.8 01/22/2015 1559   ALBUMIN 3.5 06/16/2017 0913   ALBUMIN 2.5 (L) 03/04/2017 0918   ALBUMIN 3.9 01/22/2015 1559   AST 41 06/16/2017 0913   AST 34 01/22/2015 1559   ALT 24 06/16/2017 0913   ALT 42 01/22/2015 1559   ALKPHOS 167 (H) 06/16/2017 0913   ALKPHOS 157 (H) 01/22/2015 1559   BILITOT 1.4 (H) 06/16/2017 0913  BILITOT 6.2 (H) 03/04/2017 0918   BILITOT 0.2 (L) 01/22/2015 1559   GFRNONAA >60 06/16/2017 0913   GFRNONAA >60 01/22/2015 1559   GFRAA >60 06/16/2017 0913   GFRAA >60 01/22/2015 1559    No results found for: SPEP, UPEP  Lab Results  Component Value Date   WBC 1.7 (L) 06/16/2017   NEUTROABS 0.9 (L) 06/16/2017   HGB 11.5 (L) 06/16/2017   HCT 33.6 (L) 06/16/2017   MCV 91.8 06/16/2017   PLT 159 06/16/2017      Chemistry      Component Value Date/Time   NA 136 06/16/2017 0913   NA 135 03/04/2017 0918   NA 135 01/08/2015 0850   K 4.5 06/16/2017 0913   K 3.7 01/08/2015 0850   CL 100 (L) 06/16/2017 0913   CL 101 01/08/2015 0850   CO2 28 06/16/2017 0913   CO2 26 01/08/2015 0850   BUN 18 06/16/2017 0913   BUN 8 03/04/2017 0918    BUN 17 01/08/2015 0850   CREATININE 0.81 06/16/2017 0913   CREATININE 0.82 01/22/2015 1559      Component Value Date/Time   CALCIUM 9.6 06/16/2017 0913   CALCIUM 9.3 01/08/2015 0850   ALKPHOS 167 (H) 06/16/2017 0913   ALKPHOS 157 (H) 01/22/2015 1559   AST 41 06/16/2017 0913   AST 34 01/22/2015 1559   ALT 24 06/16/2017 0913   ALT 42 01/22/2015 1559   BILITOT 1.4 (H) 06/16/2017 0913   BILITOT 6.2 (H) 03/04/2017 0918   BILITOT 0.2 (L) 01/22/2015 1559     IMPRESSION: Normal left ventricular wall motion with estimated ejection fraction of 65%.   Electronically Signed   By: Marijo Sanes M.D.   On: 07/27/2016 09:04  RADIOGRAPHIC STUDIES: I have personally reviewed the radiological images as listed and agreed with the findings in the report. No results found.   ASSESSMENT & PLAN:  Carcinoma of upper-outer quadrant of left breast in female, estrogen receptor positive ([place]) # RECURRENT Metastatic breast cancer- ER/PR positive HER-2/neu negative [status post liver biopsy February 2018; previously HER-2/neu positive]. Currently on palliative Faslodex + Abemaciclib re-started at 68m/day/  No evidence of clinical progression.  # Clinically improving; AUG 6th 2018- CT scan shows improvement of the liver lesions; tumor markers improving. Today white count is  1.9 ANC 900; Hb-11; platelets Normal.   # Continue Abemaciclib at 587mday.  Neutropenia ANC 900; however asymptomatic. Continue Abema at the current dose. Stop arimidex.   # Brain mets- s/p RT [finished Feb 28th]; symptomatic improvement.  schedule MRI asap.  # PN G-1-2/ improving secondary to chemotherapy. Continue Lyrica;.  Continue oxycodone once a day. Patient continues to be on physical therapy.  # faslodex today; follow up in 4 weeks/labs/MRI prior/faslodex again.  Orders Placed This Encounter  Procedures  . CBC with Differential    Standing Status:   Standing    Number of Occurrences:   20    Standing Expiration  Date:   06/16/2018  . Comprehensive metabolic panel    Standing Status:   Standing    Number of Occurrences:   20    Standing Expiration Date:   06/16/2018  . Cancer antigen 27.29    Standing Status:   Standing    Number of Occurrences:   20    Standing Expiration Date:   06/16/2018   All questions were answered. The patient knows to call the clinic with any problems, questions or concerns.      GoCammie Sickle  MD 06/25/2017 8:39 PM

## 2017-06-17 ENCOUNTER — Ambulatory Visit: Payer: BLUE CROSS/BLUE SHIELD

## 2017-06-17 DIAGNOSIS — M6281 Muscle weakness (generalized): Secondary | ICD-10-CM

## 2017-06-17 DIAGNOSIS — R262 Difficulty in walking, not elsewhere classified: Secondary | ICD-10-CM

## 2017-06-17 DIAGNOSIS — M79641 Pain in right hand: Secondary | ICD-10-CM | POA: Diagnosis not present

## 2017-06-17 DIAGNOSIS — M79642 Pain in left hand: Secondary | ICD-10-CM | POA: Diagnosis not present

## 2017-06-17 DIAGNOSIS — T451X5A Adverse effect of antineoplastic and immunosuppressive drugs, initial encounter: Secondary | ICD-10-CM | POA: Diagnosis not present

## 2017-06-17 DIAGNOSIS — G620 Drug-induced polyneuropathy: Secondary | ICD-10-CM | POA: Diagnosis not present

## 2017-06-17 LAB — CANCER ANTIGEN 27.29: CAN 27.29: 21.3 U/mL (ref 0.0–38.6)

## 2017-06-17 NOTE — Therapy (Signed)
[facility] [address] [place], [address] Phone: [phone]   Fax:  [phone]  Physical Therapy Treatment  Patient Details  Name: Sarah Horne MRN: [id number] Date of Birth: 1956-05-16 Referring Provider: Charlaine Dalton, MD  Encounter Date: 06/17/2017      PT End of Session - 06/17/17 1302    Visit Number 16   Number of Visits 22   Date for PT Re-Evaluation 07/01/17   PT Start Time 1302   PT Stop Time 1343   PT Time Calculation (min) 41 min   Equipment Utilized During Treatment --  [facility]   Activity Tolerance Patient tolerated treatment well   Behavior During Therapy [facility] for tasks assessed/performed      Past Medical History:  Diagnosis Date  . Chemotherapy-induced peripheral neuropathy ([place])   . Epistaxis    a. 11/2016 in setting of asa/plavix-->silver nitrate cauterization.  . GI bleed    a. 11/2016 Admission w/ GIB and hypovolemic shock req 3u PRBC's;  b. 11/2016 ECG: gastritis & nonbleeding peptic ulcer; c. 11/2016 Conlonoscopy: rectal and sigmoid colonic ulcers.  . Heart attack ([place])    a. 1998 Cath @ [facility]: reportedly no intervention required.  Marland Kitchen Herceptin-induced cardiomyopathy ([place])    a. In the setting of Herceptin Rx for breast cancer (initiated 12/2014); b. 03/2015 MUGA EF 64%; b. 08/2015 MUGA: EF 51%; c. 10/2015 MUGA: EF 44%; d. 11/2015 Echo: EF 45-50%; e. 01/2016 MUGA: EF 60%; f. 06/2016 MUGA EF 65%; g. 10/2016 MUGA: EF 61%;  h. 12/2016 Echo: EF 55-60%, gr1 DD.  Marland Kitchen Hyperlipidemia   . Hypertension   . Neuropathy   . Possible PAD (peripheral artery disease) ([place])    a. 11/2016 LE cyanosis and weak pulses-->CTA w/o significant Ao-BiFem dzs. ? distal dzs-->ASA/Plavix initiated by vascular surgery.  Marland Kitchen PSVT (paroxysmal supraventricular tachycardia) ([place])    a. Dx 11/2016.  . Pulmonary embolism ([place])    a. 12/2016 CTA Chest: small nonocclusive PE in inferior segment of the Left lingula, somewhat  eccentric filling defect suggesting chronic rather than acute embolic event; b. 01/931 LE U/S:  No DVT; c. 12/2016 Echo: Nl RV fxn, nl PASP.  Marland Kitchen Recurrent Metastatic breast cancer ([place])    a. Dx 2016: Stage II, ER positive, PR positive, HER-2/neu overexpressing of the left breast-->chemo/radiation; b. 10/2016 CT Abd/pelvis: diffuse liver mets, ill defined sclerotic bone lesions-T12;  c. 10/2016 MRI brain: metastatic lesion along L temporal lobe (19x94m) w/ extensive surrounding edema & 528mmidline shift to right.  . Sinus tachycardia     Past Surgical History:  Procedure Laterality Date  . BREAST BIOPSY Left 2016   Positive  . BREAST LUMPECTOMY WITH SENTINEL LYMPH NODE BIOPSY Left 05/23/2015   Procedure: LEFT BREAST WIDE EXCISION WITH AXILLARY DISSECTION, MASTOPLASTY ;  Surgeon: JeRobert BellowMD;  Location: [facility] ORS;  Service: General;  Laterality: Left;  . BREAST SURGERY Left 12/18/14   breast biopsy/INVASIVE DUCTAL CARCINOMA OF BREAST, NOTTINGHAM GRADE 2.  . Marland KitchenREAST SURGERY  05/23/2015.   Wide excision/mastoplasty, axillary dissection. No residual invasive cancer, positive for residual DCIS. 0/2 nodes identified on axillary dissection. (no SLN by technetium or methylene blue)  . CARDIAC CATHETERIZATION    . COLONOSCOPY WITH PROPOFOL N/A 12/10/2016   Procedure: COLONOSCOPY WITH PROPOFOL;  Surgeon: DaLucilla LameMD;  Location: [facility] ENDOSCOPY;  Service: Endoscopy;  Laterality: N/A;  . COLONOSCOPY WITH PROPOFOL N/A 02/13/2017   Procedure: COLONOSCOPY WITH PROPOFOL;  Surgeon: WoAllen Norris  Darren, MD;  Location: [facility];  Service: Endoscopy;  Laterality: N/A;  . ESOPHAGOGASTRODUODENOSCOPY (EGD) WITH PROPOFOL N/A 12/08/2016   Procedure: ESOPHAGOGASTRODUODENOSCOPY (EGD) WITH PROPOFOL;  Surgeon: Lucilla Lame, MD;  Location: [facility] ENDOSCOPY;  Service: Endoscopy;  Laterality: N/A;  . IVC FILTER INSERTION N/A 02/15/2017   Procedure: IVC Filter Insertion;  Surgeon: Algernon Huxley, MD;  Location: [facility];  Service: Cardiovascular;  Laterality: N/A;  . PORTACATH PLACEMENT Right 12-31-14   Dr Bary Castilla    There were no vitals filed for this visit.      Subjective Assessment - 06/17/17 1306    Subjective Walking is getting better.    Pertinent History Bilateral UE and LE weakness. Pt states that she feels like her weakness came from her neuropathy. Difficulty writing. Both hands feel numb currently but can feel her hand when she touches it. Has had the weakness for about 3 months (since April 2018) which began gradually.  Went through chemo and radiation last year for about 2.5 months until August 2017.  Still has cancer in her L breast which also traveled to her brain and liver.   Pt also lost 10 lbs since she was diagnosed with cancer 2 years ago.  Apetite is fine. Normally eats breakfast at times. Normally eats one good meal during the day and eats junk in between such as cookies and sodas.  Eats a frozen dinner during lunch. Does not know if she gets enough protein.   Only has pain in her feet and hands bilaterally.  No neck or back pain.   Has been using a rw since May 2018.  Was independent with gait before May 2018.  Pt goal is to improve her function.    Patient Stated Goals I want to be able to get out of the walker and not use a cane. Be able to do all the things she used to do such as cooking a meal, laundry,    Currently in Pain? Other (Comment)  no pain other than hands and feet                                 PT Education - 06/17/17 1307    Education provided Yes   Education Details ther-ex   [employer]) Educated Patient   Methods Explanation;Demonstration;Tactile cues;Verbal cues   Comprehension Returned demonstration;Verbalized understanding        Objectives   There-ex  Blood pressure R arm sitting, mechanically taken: 107/70, HR 94 at start of session. SpO2 100% room Air    standing bilateral calf stretch at stair step with bilateral UE  assist 30 seconds x 3   standing mini static forward lunge with R foot back with R UE to no UE assist             10x3 to promote ankle DF  Standing tandem stance, light touch to no UE assist   R foot in front of L 30 seconds x 3  L foot in front or R 30 seconds x 3  Sit <> stand from regular chair  Bilateral UE assist to stand, no UE assist to sit 5x3   Ankle DF on rocker board with bilateral UE assist 2 min with 5 second gastroc stretches.   Seated R ankle DF with PT assist to end range 10x2 with 5 second holds at end range  Forward step up onto and off 3 inch step with light touch to no UE assist   5x2 R LE  5x2 L LE   360 degree turns without UE assist 2x each direction  Able to turn to the L 4 seconds safely.    Standing heel toe raises with bilateral UE assist 10x2   Improved exercise technique, movement at target joints, use of target muscles after min to mod verbal, visual, tactile cues.    Able to achieve and maintain tandem stance without UE assist for 19 seconds each LE without LOB. Continued working on R ankle DF ROM, tibialis strengthening, LE strengthening and balance to promote ability to ambulate with better foot clearance and improve ability to perform functional tasks at home. Pt tolerated session well without aggravation of symptoms.           PT Long Term Goals - 06/01/17 1400      PT LONG TERM GOAL #1   Title Patient will be able to perform her TUG time using a rw to 18 seconds or less to promote functional mobility.   Baseline 25.3 seconds average with rw (04/20/2017); 12 seconds on average with SPC (05/20/2017)   Time 6   Period Weeks   Status Achieved     PT LONG TERM GOAL #2   Title Patient will improve bilateral UE and bilateral hip, and knee strength to promote ability to perform functional tasks at home.    Time 4   Period Weeks   Status On-going   Target Date 07/01/17     PT LONG TERM GOAL #3   Title Patient will  improve her LEFS score by at least 9 points as a demonstration of improved function.    Baseline 25/80 (04/20/2017); 57/80 (05/20/2017)   Time 6   Period Weeks   Status Achieved     PT LONG TERM GOAL #4   Title Patient will improve her Quick Dash Disability/Symptom score by at least 15% as a demonstration of improved function.    Baseline 70.45% (04/20/2017); 25% (05/20/2017)   Time 6   Period Weeks   Status Achieved     PT LONG TERM GOAL #5   Title Patient will be able to perform her TUG time without AD to 12 seconds or less to promote functional mobility.   Baseline 12 seconds with SPC (05/20/2017); 10 seconds average without AD (06/01/2017)   Time 4   Period Weeks   Status Achieved     Additional Long Term Goals   Additional Long Term Goals Yes     PT LONG TERM GOAL #6   Title Pt will be able to ambulate at least 6 minutes without AD with SBA and no LOB to promote mobility and function.    Baseline able to ambulate 400 ft without AD with CGA, no LOB (05/20/2017); able to ambulate 1042 ft in 6 min without AD and with SBA, no LOB (06/01/2017)    Time 4   Period Weeks   Status Achieved     PT LONG TERM GOAL #7   Title Pt will improve supine R ankle DF AROM to - 17 degrees or less to promote foot clearance during gait.     Baseline -28 degrees (06/01/2017)   Time 4   Period Weeks   Status New   Target Date 07/01/17     PT LONG TERM GOAL #8   Title Pt will improve her BERG balance score to 47/56 or more as  a demonstration of improved balance.    Baseline 42/56 (05/25/2017)   Time 4   Period Days   Status New   Target Date 07/01/17               Plan - 06/17/17 1301    Clinical Impression Statement Able to achieve and maintain tandem stance without UE assist for 19 seconds each LE without LOB. Continued working on R ankle DF ROM, tibialis strengthening, LE strengthening and balance to promote ability to ambulate with better foot clearance and improve ability to perform  functional tasks at home. Pt tolerated session well without aggravation of symptoms.    History and Personal Factors relevant to plan of care: hx of CA, weakness, multiple health problems   Clinical Presentation Stable   Clinical Presentation due to: able to maintain tandem stance without LOB and no UE assist for at least 19 seconds   Clinical Decision Making Low   Rehab Potential Fair   Clinical Impairments Affecting Rehab Potential hx of CA, multiple health problems   PT Frequency 2x / week   PT Duration 6 weeks   PT Treatment/Interventions Manual techniques;Patient/family education;Therapeutic activities;Therapeutic exercise;Gait training;Neuromuscular re-education;Balance training   PT Next Visit Plan hip, scapular, LE, UE strengthening, gait, balance, femoral control   Consulted and Agree with Plan of Care Patient      Patient will benefit from skilled therapeutic intervention in order to improve the following deficits and impairments:  Pain, Improper body mechanics, Postural dysfunction, Impaired UE functional use, Decreased strength, Difficulty walking, Decreased endurance  Visit Diagnosis: Muscle weakness (generalized)  Difficulty in walking, not elsewhere classified     Problem List Patient Active Problem List   Diagnosis Date Noted  . Brain metastasis ([place]) 05/19/2017  . Blood in stool   . Polyp of sigmoid colon   . Benign neoplasm of transverse colon   . Benign neoplasm of ascending colon   . Lower GI bleed   . Weakness of right lower extremity 02/11/2017  . Orthostatic lightheadedness 01/14/2017  . Atrial tachycardia ([place]) 01/14/2017  . Hypokalemia 01/14/2017  . Pulmonary emboli ([place]) 01/12/2017  . Dehydration 12/25/2016  . Blood loss anemia 12/15/2016  . Ulceration of colon   . Heartburn   . Duodenitis   . Gastritis without bleeding   . GI bleed   . Acute esophagitis   . Acute esophagogastric ulcer   . Rectal bleed   . Hypovolemic shock ([place])   .  Ischemic leg 12/04/2016  . Chemotherapy induced neutropenia ([place]) 11/20/2016  . Palliative care by specialist   . Goals of care, counseling/discussion   . DNR (do not resuscitate) discussion   . Cerebral edema ([place]) 11/10/2016  . Encounter for monitoring cardiotoxic drug therapy 11/09/2016  . Elevated LFTs 10/20/2016  . URI with cough and congestion 08/25/2016  . Peripheral neuropathy due to chemotherapy ([place]) 03/13/2016  . Encounter for antineoplastic chemotherapy 03/13/2016  . HLD (hyperlipidemia) 01/29/2016  . Hyperglycemia, unspecified 01/29/2016  . Cardiomyopathy ([place]) 01/13/2016  . Hypertension 07/18/2015  . Carcinoma of upper-outer quadrant of left breast in female, estrogen receptor positive ([place]) 12/27/2014    Joneen Boers PT, DPT   06/17/2017, 8:09 PM  [facility] [address], [place], [address] Phone: [phone]   Fax:  [phone]  Name: Sarah Horne MRN: [id number] Date of Birth: [birth year]

## 2017-06-22 ENCOUNTER — Ambulatory Visit
Admission: RE | Admit: 2017-06-22 | Discharge: 2017-06-22 | Disposition: A | Discharge status: Home / Self Care | Payer: BLUE CROSS/BLUE SHIELD | Source: Ambulatory Visit | Attending: Internal Medicine | Admitting: Internal Medicine

## 2017-06-22 ENCOUNTER — Ambulatory Visit: Payer: BLUE CROSS/BLUE SHIELD

## 2017-06-22 ENCOUNTER — Ambulatory Visit: Payer: BLUE CROSS/BLUE SHIELD | Admitting: Occupational Therapy

## 2017-06-22 DIAGNOSIS — M6281 Muscle weakness (generalized): Secondary | ICD-10-CM

## 2017-06-22 DIAGNOSIS — C50412 Malignant neoplasm of upper-outer quadrant of left female breast: Secondary | ICD-10-CM | POA: Diagnosis not present

## 2017-06-22 DIAGNOSIS — C7931 Secondary malignant neoplasm of brain: Secondary | ICD-10-CM | POA: Insufficient documentation

## 2017-06-22 DIAGNOSIS — Z170 Estrogen receptor positive status [ER+]: Secondary | ICD-10-CM | POA: Insufficient documentation

## 2017-06-22 DIAGNOSIS — M79641 Pain in right hand: Secondary | ICD-10-CM | POA: Diagnosis not present

## 2017-06-22 DIAGNOSIS — M79642 Pain in left hand: Secondary | ICD-10-CM | POA: Diagnosis not present

## 2017-06-22 DIAGNOSIS — T451X5A Adverse effect of antineoplastic and immunosuppressive drugs, initial encounter: Secondary | ICD-10-CM | POA: Diagnosis not present

## 2017-06-22 DIAGNOSIS — R262 Difficulty in walking, not elsewhere classified: Secondary | ICD-10-CM

## 2017-06-22 DIAGNOSIS — G620 Drug-induced polyneuropathy: Secondary | ICD-10-CM | POA: Diagnosis not present

## 2017-06-22 DIAGNOSIS — C50919 Malignant neoplasm of unspecified site of unspecified female breast: Secondary | ICD-10-CM | POA: Diagnosis not present

## 2017-06-22 LAB — SURGICAL PATHOLOGY

## 2017-06-22 MED ORDER — GADOBENATE DIMEGLUMINE 529 MG/ML IV SOLN
10.0000 mL | Freq: Once | INTRAVENOUS | Status: AC | PRN
  Administered 2017-06-22: 10 mL via INTRAVENOUS

## 2017-06-22 NOTE — Therapy (Signed)
[facility] [address] [place], [address] Phone: [phone]   Fax:  [phone]  Physical Therapy Treatment  Patient Details  Name: Sarah Horne MRN: [id number] Date of Birth: [birth year] Referring Provider: Charlaine Dalton, MD  Encounter Date: 06/22/2017      PT End of Session - 06/22/17 1052    Visit Number 17   Number of Visits 22   Date for PT Re-Evaluation 07/01/17   PT Start Time 6568   PT Stop Time 1133   PT Time Calculation (min) 41 min   Equipment Utilized During Treatment --  [facility]   Activity Tolerance Patient tolerated treatment well   Behavior During Therapy [facility] for tasks assessed/performed      Past Medical History:  Diagnosis Date  . Chemotherapy-induced peripheral neuropathy ([place])   . Epistaxis    a. 11/2016 in setting of asa/plavix-->silver nitrate cauterization.  . GI bleed    a. 11/2016 Admission w/ GIB and hypovolemic shock req 3u PRBC's;  b. 11/2016 ECG: gastritis & nonbleeding peptic ulcer; c. 11/2016 Conlonoscopy: rectal and sigmoid colonic ulcers.  . Heart attack ([place])    a. 1998 Cath @ [facility]: reportedly no intervention required.  Marland Kitchen Herceptin-induced cardiomyopathy ([place])    a. In the setting of Herceptin Rx for breast cancer (initiated 12/2014); b. 03/2015 MUGA EF 64%; b. 08/2015 MUGA: EF 51%; c. 10/2015 MUGA: EF 44%; d. 11/2015 Echo: EF 45-50%; e. 01/2016 MUGA: EF 60%; f. 06/2016 MUGA EF 65%; g. 10/2016 MUGA: EF 61%;  h. 12/2016 Echo: EF 55-60%, gr1 DD.  Marland Kitchen Hyperlipidemia   . Hypertension   . Neuropathy   . Possible PAD (peripheral artery disease) ([place])    a. 11/2016 LE cyanosis and weak pulses-->CTA w/o significant Ao-BiFem dzs. ? distal dzs-->ASA/Plavix initiated by vascular surgery.  Marland Kitchen PSVT (paroxysmal supraventricular tachycardia) ([place])    a. Dx 11/2016.  . Pulmonary embolism ([place])    a. 12/2016 CTA Chest: small nonocclusive PE in inferior segment of the Left lingula, somewhat  eccentric filling defect suggesting chronic rather than acute embolic event; b. 09/2749 LE U/S:  No DVT; c. 12/2016 Echo: Nl RV fxn, nl PASP.  Marland Kitchen Recurrent Metastatic breast cancer ([place])    a. Dx 2016: Stage II, ER positive, PR positive, HER-2/neu overexpressing of the left breast-->chemo/radiation; b. 10/2016 CT Abd/pelvis: diffuse liver mets, ill defined sclerotic bone lesions-T12;  c. 10/2016 MRI brain: metastatic lesion along L temporal lobe (19x2m) w/ extensive surrounding edema & 556mmidline shift to right.  . Sinus tachycardia     Past Surgical History:  Procedure Laterality Date  . BREAST BIOPSY Left 2016   Positive  . BREAST LUMPECTOMY WITH SENTINEL LYMPH NODE BIOPSY Left 05/23/2015   Procedure: LEFT BREAST WIDE EXCISION WITH AXILLARY DISSECTION, MASTOPLASTY ;  Surgeon: JeRobert BellowMD;  Location: [facility] ORS;  Service: General;  Laterality: Left;  . BREAST SURGERY Left 12/18/14   breast biopsy/INVASIVE DUCTAL CARCINOMA OF BREAST, NOTTINGHAM GRADE 2.  . Marland KitchenREAST SURGERY  05/23/2015.   Wide excision/mastoplasty, axillary dissection. No residual invasive cancer, positive for residual DCIS. 0/2 nodes identified on axillary dissection. (no SLN by technetium or methylene blue)  . CARDIAC CATHETERIZATION    . COLONOSCOPY WITH PROPOFOL N/A 12/10/2016   Procedure: COLONOSCOPY WITH PROPOFOL;  Surgeon: DaLucilla LameMD;  Location: [facility] ENDOSCOPY;  Service: Endoscopy;  Laterality: N/A;  . COLONOSCOPY WITH PROPOFOL N/A 02/13/2017   Procedure: COLONOSCOPY WITH PROPOFOL;  Surgeon: WoAllen Norris  Darren, MD;  Location: [facility];  Service: Endoscopy;  Laterality: N/A;  . ESOPHAGOGASTRODUODENOSCOPY (EGD) WITH PROPOFOL N/A 12/08/2016   Procedure: ESOPHAGOGASTRODUODENOSCOPY (EGD) WITH PROPOFOL;  Surgeon: Lucilla Lame, MD;  Location: [facility] ENDOSCOPY;  Service: Endoscopy;  Laterality: N/A;  . IVC FILTER INSERTION N/A 02/15/2017   Procedure: IVC Filter Insertion;  Surgeon: Algernon Huxley, MD;  Location: [facility];  Service: Cardiovascular;  Laterality: N/A;  . PORTACATH PLACEMENT Right 12-31-14   Dr Bary Castilla    There were no vitals filed for this visit.      Subjective Assessment - 06/22/17 1056    Subjective No pain or discomfort other than hands or feet. Walking is just fine.    Pertinent History Bilateral UE and LE weakness. Pt states that she feels like her weakness came from her neuropathy. Difficulty writing. Both hands feel numb currently but can feel her hand when she touches it. Has had the weakness for about 3 months (since April 2018) which began gradually.  Went through chemo and radiation last year for about 2.5 months until August 2017.  Still has cancer in her L breast which also traveled to her brain and liver.   Pt also lost 10 lbs since she was diagnosed with cancer 2 years ago.  Apetite is fine. Normally eats breakfast at times. Normally eats one good meal during the day and eats junk in between such as cookies and sodas.  Eats a frozen dinner during lunch. Does not know if she gets enough protein.   Only has pain in her feet and hands bilaterally.  No neck or back pain.   Has been using a rw since May 2018.  Was independent with gait before May 2018.  Pt goal is to improve her function.    Patient Stated Goals I want to be able to get out of the walker and not use a cane. Be able to do all the things she used to do such as cooking a meal, laundry,    Currently in Pain? Other (Comment)  No pain other than hands and feet            [facility] PT Assessment - 06/22/17 1103      AROM   Right Ankle Dorsiflexion -18  -15 degrees AAROM     Berg Balance Test   Sit to Stand Able to stand  independently using hands   Standing Unsupported Able to stand safely 2 minutes   Sitting with Back Unsupported but Feet Supported on Floor or Stool Able to sit safely and securely 2 minutes   Stand to Sit Sits safely with minimal use of hands   Transfers Able to transfer safely, minor use of hands    Standing Unsupported with Eyes Closed Able to stand 10 seconds with supervision   Standing Ubsupported with Feet Together Able to place feet together independently and stand 1 minute safely   From Standing, Reach Forward with Outstretched Arm Can reach forward >12 cm safely (5")   From Standing Position, Pick up Object from Floor Able to pick up shoe safely and easily   From Standing Position, Turn to Look Behind Over each Shoulder Looks behind one side only/other side shows less weight shift   Turn 360 Degrees Able to turn 360 degrees safely one side only in 4 seconds or less   Standing Unsupported, Alternately Place Feet on Step/Stool Able to stand independently and safely and complete 8 steps in 20 seconds   Standing  Unsupported, One Foot in [place] to place foot tandem independently and hold 30 seconds   Standing on One Leg Able to lift leg independently and hold 5-10 seconds  9 sec on L LE and R LE   Total Score 50   Berg comment: > 46/56 suggests decreased fall risk                              PT Education - 06/22/17 1056    Education provided Yes   Education Details ther-ex   [employer]) Educated Patient   Methods Explanation;Demonstration;Tactile cues;Verbal cues   Comprehension Returned demonstration;Verbalized understanding        Objectives   There-ex  Blood pressure R arm sitting, mechanically taken: 118/80, HR 98 at start of session. SpO2 99% room Air  Ankle DF on rocker board with bilateral UE assist 2 min with 5 second gastroc stretches.   Seated R ankle DF with PT assist to end range 10x2 with 5 second holds at end range  Sit <> stand from regular chair             Bilateral UE assist to stand, no UE assist to sit 5x2   Supine R ankle DF AROM  Directed patient with sit <> stand throughout session Stand pivot transfer chair <> low mat table Static standing shoulder width apart, then with eyes closed, then with eyes open feet  together, tandem stance with feet shoulder width apart Picking up an object (computer mouse) from the floor,  Turning 360 degrees to the R and to the L 1x Looking behind to the R and to the L,  Standing forward reach,   Standing alternate toe taps 4 x each LE onto first regular step  Stepping onto and off curb 3x. Able to perform independently and no LOB.   Improved exercise technique, movement at target joints, use of target muscles after min to mod verbal, visual, tactile cues.    Pt demonstrates improved R ankle DF AROM by 10 degrees since measured on 06/01/17. Decreases AROM with repetition due to fatigue. Improved R ankle DF during R LE swing phase observed. Improved Berg Balance score to 50/56 suggesting decreased fall risk and decreased need for AD. Pt making very good progress with PT towards goals.            PT Long Term Goals - 06/22/17 1137      PT LONG TERM GOAL #1   Title Patient will be able to perform her TUG time using a rw to 18 seconds or less to promote functional mobility.   Baseline 25.3 seconds average with rw (04/20/2017); 12 seconds on average with SPC (05/20/2017)   Time 6   Period Weeks   Status Achieved     PT LONG TERM GOAL #2   Title Patient will improve bilateral UE and bilateral hip, and knee strength to promote ability to perform functional tasks at home.    Time 4   Period Weeks   Status On-going     PT LONG TERM GOAL #3   Title Patient will improve her LEFS score by at least 9 points as a demonstration of improved function.    Baseline 25/80 (04/20/2017); 57/80 (05/20/2017)   Time 6   Period Weeks   Status Achieved     PT LONG TERM GOAL #4   Title Patient will improve her Quick Dash Disability/Symptom score by at least 15% as  a demonstration of improved function.    Baseline 70.45% (04/20/2017); 25% (05/20/2017)   Time 6   Period Weeks   Status Achieved     PT LONG TERM GOAL #5   Title Patient will be able to perform her TUG time  without AD to 12 seconds or less to promote functional mobility.   Baseline 12 seconds with SPC (05/20/2017); 10 seconds average without AD (06/01/2017)   Time 4   Period Weeks   Status Achieved     PT LONG TERM GOAL #6   Title Pt will be able to ambulate at least 6 minutes without AD with SBA and no LOB to promote mobility and function.    Baseline able to ambulate 400 ft without AD with CGA, no LOB (05/20/2017); able to ambulate 1042 ft in 6 min without AD and with SBA, no LOB (06/01/2017)    Time 4   Period Weeks   Status Achieved     PT LONG TERM GOAL #7   Title Pt will improve supine R ankle DF AROM to - 17 degrees or less to promote foot clearance during gait.     Baseline -28 degrees (06/01/2017); -18 degrees (06/22/2017)   Time 4   Period Weeks   Status Partially Met   Target Date 07/01/17     PT LONG TERM GOAL #8   Title Pt will improve her BERG balance score to 47/56 or more as a demonstration of improved balance.    Baseline 42/56 (05/25/2017); 50/56 (06/22/2017)   Time 4   Period Days   Status Achieved   Target Date 07/01/17               Plan - 06/22/17 1058    Clinical Impression Statement Pt demonstrates improved R ankle DF AROM by 10 degrees since measured on 06/01/17. Decreases AROM with repetition due to fatigue. Improved R ankle DF during R LE swing phase observed. Improved Berg Balance score to 50/56 suggesting decreased fall risk and decreased need for AD. Pt making very good progress with PT towards goals.    History and Personal Factors relevant to plan of care: hx of CA, weakness, multiple health problems   Clinical Presentation Stable   Clinical Presentation due to: improved R ankle DF AROM, and balance   Clinical Decision Making Low   Rehab Potential Fair   Clinical Impairments Affecting Rehab Potential hx of CA, multiple health problems   PT Frequency 2x / week   PT Duration 6 weeks   PT Treatment/Interventions Manual techniques;Patient/family  education;Therapeutic activities;Therapeutic exercise;Gait training;Neuromuscular re-education;Balance training   PT Next Visit Plan hip, scapular, LE, UE strengthening, gait, balance, femoral control   Consulted and Agree with Plan of Care Patient      Patient will benefit from skilled therapeutic intervention in order to improve the following deficits and impairments:  Pain, Improper body mechanics, Postural dysfunction, Impaired UE functional use, Decreased strength, Difficulty walking, Decreased endurance  Visit Diagnosis: Muscle weakness (generalized)  Difficulty in walking, not elsewhere classified     Problem List Patient Active Problem List   Diagnosis Date Noted  . Brain metastasis ([place]) 05/19/2017  . Blood in stool   . Polyp of sigmoid colon   . Benign neoplasm of transverse colon   . Benign neoplasm of ascending colon   . Lower GI bleed   . Weakness of right lower extremity 02/11/2017  . Orthostatic lightheadedness 01/14/2017  . Atrial tachycardia ([place]) 01/14/2017  . Hypokalemia 01/14/2017  .  Pulmonary emboli ([place]) 01/12/2017  . Dehydration 12/25/2016  . Blood loss anemia 12/15/2016  . Ulceration of colon   . Heartburn   . Duodenitis   . Gastritis without bleeding   . GI bleed   . Acute esophagitis   . Acute esophagogastric ulcer   . Rectal bleed   . Hypovolemic shock ([place])   . Ischemic leg 12/04/2016  . Chemotherapy induced neutropenia ([place]) 11/20/2016  . Palliative care by specialist   . Goals of care, counseling/discussion   . DNR (do not resuscitate) discussion   . Cerebral edema ([place]) 11/10/2016  . Encounter for monitoring cardiotoxic drug therapy 11/09/2016  . Elevated LFTs 10/20/2016  . URI with cough and congestion 08/25/2016  . Peripheral neuropathy due to chemotherapy ([place]) 03/13/2016  . Encounter for antineoplastic chemotherapy 03/13/2016  . HLD (hyperlipidemia) 01/29/2016  . Hyperglycemia, unspecified 01/29/2016  . Cardiomyopathy ([place])  01/13/2016  . Hypertension 07/18/2015  . Carcinoma of upper-outer quadrant of left breast in female, estrogen receptor positive ([place]) 12/27/2014    Joneen Boers PT, DPT   06/22/2017, 11:45 AM  [facility] [address], [place], [address] Phone: [phone]   Fax:  [phone]  Name: NELEH MULDOON MRN: [id number] Date of Birth: June 29, 1956

## 2017-06-23 ENCOUNTER — Encounter: Payer: Self-pay | Admitting: Internal Medicine

## 2017-06-23 ENCOUNTER — Telehealth: Payer: Self-pay | Admitting: *Deleted

## 2017-06-23 NOTE — Telephone Encounter (Signed)
Patient asking for MRI results, her next appointment is not for 3 weeks  IMPRESSION: 1. Decreased size of enhancing metastatic lesion in left temporal lobe from 11/10/2016. Stable small surrounding T2 FLAIR signal abnormality compatible with mild edema from 02/12/2017. 2. No new intracranial metastasis identified. 3. No evidence for acute infarction, hemorrhage, or mass effect. 4. Interval increase in T1 signal within bilateral globus pallidus and substantia nigra. This is often seen with acute or chronic liver disease, hyperammonemia, and manganese deposition.   Electronically Signed   By: Kristine Garbe M.D.   On: 06/23/2017 04:06

## 2017-06-23 NOTE — Telephone Encounter (Signed)
Patient informed per VO Dr B that MRI improved. She thanked me for calling her back

## 2017-06-24 ENCOUNTER — Ambulatory Visit: Payer: BLUE CROSS/BLUE SHIELD | Admitting: Occupational Therapy

## 2017-06-24 ENCOUNTER — Ambulatory Visit: Payer: BLUE CROSS/BLUE SHIELD

## 2017-06-24 DIAGNOSIS — M6281 Muscle weakness (generalized): Secondary | ICD-10-CM | POA: Diagnosis not present

## 2017-06-24 DIAGNOSIS — G620 Drug-induced polyneuropathy: Secondary | ICD-10-CM

## 2017-06-24 DIAGNOSIS — M79641 Pain in right hand: Secondary | ICD-10-CM | POA: Diagnosis not present

## 2017-06-24 DIAGNOSIS — T451X5A Adverse effect of antineoplastic and immunosuppressive drugs, initial encounter: Principal | ICD-10-CM

## 2017-06-24 DIAGNOSIS — R262 Difficulty in walking, not elsewhere classified: Secondary | ICD-10-CM | POA: Diagnosis not present

## 2017-06-24 DIAGNOSIS — M79642 Pain in left hand: Secondary | ICD-10-CM | POA: Diagnosis not present

## 2017-06-24 NOTE — Therapy (Signed)
[facility] [address], [place], [address] Phone: [phone]   Fax:  [phone]  Physical Therapy Treatment  Patient Details  Name: Sarah Horne MRN: [id number] Date of Birth: [birth year] Referring Provider: Charlaine Dalton, MD  Encounter Date: 06/24/2017      PT End of Session - 06/24/17 1305    Visit Number 18   Number of Visits 22   Date for PT Re-Evaluation 07/01/17   PT Start Time 3785   PT Stop Time 1347   PT Time Calculation (min) 42 min   Equipment Utilized During Treatment --  [facility]   Activity Tolerance Patient tolerated treatment well   Behavior During Therapy [facility] for tasks assessed/performed      Past Medical History:  Diagnosis Date  . Chemotherapy-induced peripheral neuropathy ([place])   . Epistaxis    a. 11/2016 in setting of asa/plavix-->silver nitrate cauterization.  . GI bleed    a. 11/2016 Admission w/ GIB and hypovolemic shock req 3u PRBC's;  b. 11/2016 ECG: gastritis & nonbleeding peptic ulcer; c. 11/2016 Conlonoscopy: rectal and sigmoid colonic ulcers.  . Heart attack ([place])    a. 1998 Cath @ [facility]: reportedly no intervention required.  Marland Kitchen Herceptin-induced cardiomyopathy ([place])    a. In the setting of Herceptin Rx for breast cancer (initiated 12/2014); b. 03/2015 MUGA EF 64%; b. 08/2015 MUGA: EF 51%; c. 10/2015 MUGA: EF 44%; d. 11/2015 Echo: EF 45-50%; e. 01/2016 MUGA: EF 60%; f. 06/2016 MUGA EF 65%; g. 10/2016 MUGA: EF 61%;  h. 12/2016 Echo: EF 55-60%, gr1 DD.  Marland Kitchen Hyperlipidemia   . Hypertension   . Neuropathy   . Possible PAD (peripheral artery disease) ([place])    a. 11/2016 LE cyanosis and weak pulses-->CTA w/o significant Ao-BiFem dzs. ? distal dzs-->ASA/Plavix initiated by vascular surgery.  Marland Kitchen PSVT (paroxysmal supraventricular tachycardia) ([place])    a. Dx 11/2016.  . Pulmonary embolism ([place])    a. 12/2016 CTA Chest: small nonocclusive PE in inferior segment of the Left lingula, somewhat  eccentric filling defect suggesting chronic rather than acute embolic event; b. 04/8501 LE U/S:  No DVT; c. 12/2016 Echo: Nl RV fxn, nl PASP.  Marland Kitchen Recurrent Metastatic breast cancer ([place])    a. Dx 2016: Stage II, ER positive, PR positive, HER-2/neu overexpressing of the left breast-->chemo/radiation; b. 10/2016 CT Abd/pelvis: diffuse liver mets, ill defined sclerotic bone lesions-T12;  c. 10/2016 MRI brain: metastatic lesion along L temporal lobe (19x46m) w/ extensive surrounding edema & 595mmidline shift to right.  . Sinus tachycardia     Past Surgical History:  Procedure Laterality Date  . BREAST BIOPSY Left 2016   Positive  . BREAST LUMPECTOMY WITH SENTINEL LYMPH NODE BIOPSY Left 05/23/2015   Procedure: LEFT BREAST WIDE EXCISION WITH AXILLARY DISSECTION, MASTOPLASTY ;  Surgeon: JeRobert BellowMD;  Location: [facility] ORS;  Service: General;  Laterality: Left;  . BREAST SURGERY Left 12/18/14   breast biopsy/INVASIVE DUCTAL CARCINOMA OF BREAST, NOTTINGHAM GRADE 2.  . Marland KitchenREAST SURGERY  05/23/2015.   Wide excision/mastoplasty, axillary dissection. No residual invasive cancer, positive for residual DCIS. 0/2 nodes identified on axillary dissection. (no SLN by technetium or methylene blue)  . CARDIAC CATHETERIZATION    . COLONOSCOPY WITH PROPOFOL N/A 12/10/2016   Procedure: COLONOSCOPY WITH PROPOFOL;  Surgeon: DaLucilla LameMD;  Location: [facility] ENDOSCOPY;  Service: Endoscopy;  Laterality: N/A;  . COLONOSCOPY WITH PROPOFOL N/A 02/13/2017   Procedure: COLONOSCOPY WITH PROPOFOL;  Surgeon: WoAllen Norris  Darren, MD;  Location: [facility] ENDOSCOPY;  Service: Endoscopy;  Laterality: N/A;  . ESOPHAGOGASTRODUODENOSCOPY (EGD) WITH PROPOFOL N/A 12/08/2016   Procedure: ESOPHAGOGASTRODUODENOSCOPY (EGD) WITH PROPOFOL;  Surgeon: Lucilla Lame, MD;  Location: [facility] ENDOSCOPY;  Service: Endoscopy;  Laterality: N/A;  . IVC FILTER INSERTION N/A 02/15/2017   Procedure: IVC Filter Insertion;  Surgeon: Algernon Huxley, MD;  Location: [facility]  LAB;  Service: Cardiovascular;  Laterality: N/A;  . PORTACATH PLACEMENT Right 12-31-14   Dr Bary Castilla    There were no vitals filed for this visit.      Subjective Assessment - 06/24/17 1307    Subjective Walking is good at home. No pain other than the hands and feet.   Pertinent History Bilateral UE and LE weakness. Pt states that she feels like her weakness came from her neuropathy. Difficulty writing. Both hands feel numb currently but can feel her hand when she touches it. Has had the weakness for about 3 months (since April 2018) which began gradually.  Went through chemo and radiation last year for about 2.5 months until August 2017.  Still has cancer in her L breast which also traveled to her brain and liver.   Pt also lost 10 lbs since she was diagnosed with cancer 2 years ago.  Apetite is fine. Normally eats breakfast at times. Normally eats one good meal during the day and eats junk in between such as cookies and sodas.  Eats a frozen dinner during lunch. Does not know if she gets enough protein.   Only has pain in her feet and hands bilaterally.  No neck or back pain.   Has been using a rw since May 2018.  Was independent with gait before May 2018.  Pt goal is to improve her function.    Patient Stated Goals I want to be able to get out of the walker and not use a cane. Be able to do all the things she used to do such as cooking a meal, laundry,    Currently in Pain? Other (Comment)  No pain other than the hands and feet.                                 PT Education - 06/24/17 1310    Education provided Yes   Education Details ther-ex   [employer]) Educated Patient   Methods Explanation;Demonstration;Tactile cues;Verbal cues   Comprehension Returned demonstration;Verbalized understanding        Objectives   There-ex  Blood pressure R arm sitting, mechanically taken: 122/70, HR 97 at start of session. SpO2 100% room Air  Ankle DF on rocker board  with bilateral UE assist 2 min with 5 second gastroc stretches.    Heel walking 6 ft x4  Seated R ankle DF with PT assist to end range 10x2 with 5 second holds at end range   Gait in different directions: diagonal (forward, back for both L and R directions), forward, back, lateral 5 ft each 2x  Stepping over bolster 5x each LE without UE assist. Able to perform without LOB  standing bilateral calf stretch at stair step with L UE assist 30 seconds x 3   Sit <> stand from regular chair             Bilateral UE assist to stand, no UE assist to sit 5x3. Cues for femoral control and proper foot placement.   Standing LE leg press resisting  double blue band 10x each LE  Then with double blue and double green band 10x2 each LE  Side stepping 32 ft to the R and 32 ft to the L 2x  Forward step up onto and over Air Ex pad with light touch to no UE assist 10x each LE   Standing heel toe raises with bilateral UE assist 10x2   Improved exercise technique, movement at target joints, use of target muscles after mod verbal, visual, tactile cues.           PT Long Term Goals - 06/22/17 1137      PT LONG TERM GOAL #1   Title Patient will be able to perform her TUG time using a rw to 18 seconds or less to promote functional mobility.   Baseline 25.3 seconds average with rw (04/20/2017); 12 seconds on average with SPC (05/20/2017)   Time 6   Period Weeks   Status Achieved     PT LONG TERM GOAL #2   Title Patient will improve bilateral UE and bilateral hip, and knee strength to promote ability to perform functional tasks at home.    Time 4   Period Weeks   Status On-going     PT LONG TERM GOAL #3   Title Patient will improve her LEFS score by at least 9 points as a demonstration of improved function.    Baseline 25/80 (04/20/2017); 57/80 (05/20/2017)   Time 6   Period Weeks   Status Achieved     PT LONG TERM GOAL #4   Title Patient will improve her Quick Dash  Disability/Symptom score by at least 15% as a demonstration of improved function.    Baseline 70.45% (04/20/2017); 25% (05/20/2017)   Time 6   Period Weeks   Status Achieved     PT LONG TERM GOAL #5   Title Patient will be able to perform her TUG time without AD to 12 seconds or less to promote functional mobility.   Baseline 12 seconds with SPC (05/20/2017); 10 seconds average without AD (06/01/2017)   Time 4   Period Weeks   Status Achieved     PT LONG TERM GOAL #6   Title Pt will be able to ambulate at least 6 minutes without AD with SBA and no LOB to promote mobility and function.    Baseline able to ambulate 400 ft without AD with CGA, no LOB (05/20/2017); able to ambulate 1042 ft in 6 min without AD and with SBA, no LOB (06/01/2017)    Time 4   Period Weeks   Status Achieved     PT LONG TERM GOAL #7   Title Pt will improve supine R ankle DF AROM to - 17 degrees or less to promote foot clearance during gait.     Baseline -28 degrees (06/01/2017); -18 degrees (06/22/2017)   Time 4   Period Weeks   Status Partially Met   Target Date 07/01/17     PT LONG TERM GOAL #8   Title Pt will improve her BERG balance score to 47/56 or more as a demonstration of improved balance.    Baseline 42/56 (05/25/2017); 50/56 (06/22/2017)   Time 4   Period Days   Status Achieved   Target Date 07/01/17               Plan - 06/24/17 1310    Clinical Impression Statement Continued working on R ankle DF ROM, tibialis anterior strengthening to promote foot clearance and worked on  bilateral LE strengthening to promote ability to perform functional tasks at home and balance related activities to help continue decreasing fall risk.   History and Personal Factors relevant to plan of care: hx of CA, weakness, multiple health problems   Clinical Presentation Stable   Clinical Presentation due to: Pt making progress towards goals.    Clinical Decision Making Low   Rehab Potential Fair   Clinical Impairments  Affecting Rehab Potential hx of CA, multiple health problems   PT Frequency 2x / week   PT Duration 6 weeks   PT Treatment/Interventions Manual techniques;Patient/family education;Therapeutic activities;Therapeutic exercise;Gait training;Neuromuscular re-education;Balance training   PT Next Visit Plan hip, scapular, LE, UE strengthening, gait, balance, femoral control   Consulted and Agree with Plan of Care Patient      Patient will benefit from skilled therapeutic intervention in order to improve the following deficits and impairments:  Pain, Improper body mechanics, Postural dysfunction, Impaired UE functional use, Decreased strength, Difficulty walking, Decreased endurance  Visit Diagnosis: Muscle weakness (generalized)  Difficulty in walking, not elsewhere classified     Problem List Patient Active Problem List   Diagnosis Date Noted  . Brain metastasis ([place]) 05/19/2017  . Blood in stool   . Polyp of sigmoid colon   . Benign neoplasm of transverse colon   . Benign neoplasm of ascending colon   . Lower GI bleed   . Weakness of right lower extremity 02/11/2017  . Orthostatic lightheadedness 01/14/2017  . Atrial tachycardia ([place]) 01/14/2017  . Hypokalemia 01/14/2017  . Pulmonary emboli ([place]) 01/12/2017  . Dehydration 12/25/2016  . Blood loss anemia 12/15/2016  . Ulceration of colon   . Heartburn   . Duodenitis   . Gastritis without bleeding   . GI bleed   . Acute esophagitis   . Acute esophagogastric ulcer   . Rectal bleed   . Hypovolemic shock ([place])   . Ischemic leg 12/04/2016  . Chemotherapy induced neutropenia ([place]) 11/20/2016  . Palliative care by specialist   . Goals of care, counseling/discussion   . DNR (do not resuscitate) discussion   . Cerebral edema ([place]) 11/10/2016  . Encounter for monitoring cardiotoxic drug therapy 11/09/2016  . Elevated LFTs 10/20/2016  . URI with cough and congestion 08/25/2016  . Peripheral neuropathy due to chemotherapy ([place])  03/13/2016  . Encounter for antineoplastic chemotherapy 03/13/2016  . HLD (hyperlipidemia) 01/29/2016  . Hyperglycemia, unspecified 01/29/2016  . Cardiomyopathy ([place]) 01/13/2016  . Hypertension 07/18/2015  . Carcinoma of upper-outer quadrant of left breast in female, estrogen receptor positive ([place]) 12/27/2014    Joneen Boers PT, DPT   06/24/2017, 4:37 PM  [facility] [address] [place], [address] Phone: [phone]   Fax:  [phone]  Name: Sarah Horne MRN: [id number] Date of Birth: 10-25-1955

## 2017-06-24 NOTE — Therapy (Signed)
[facility] [address], [place], [address] Phone: [phone]   Fax:  [phone]  Occupational Therapy Screen  Patient Details  Name: Sarah Horne MRN: [id number] Date of Birth: 1955/10/21 Referring Provider: Rogue Bussing  Encounter Date: 06/24/2017    Past Medical History:  Diagnosis Date  . Chemotherapy-induced peripheral neuropathy ([place])   . Epistaxis    a. 11/2016 in setting of asa/plavix-->silver nitrate cauterization.  . GI bleed    a. 11/2016 Admission w/ GIB and hypovolemic shock req 3u PRBC's;  b. 11/2016 ECG: gastritis & nonbleeding peptic ulcer; c. 11/2016 Conlonoscopy: rectal and sigmoid colonic ulcers.  . Heart attack ([place])    a. 1998 Cath @ [facility]: reportedly no intervention required.  Marland Kitchen Herceptin-induced cardiomyopathy ([place])    a. In the setting of Herceptin Rx for breast cancer (initiated 12/2014); b. 03/2015 MUGA EF 64%; b. 08/2015 MUGA: EF 51%; c. 10/2015 MUGA: EF 44%; d. 11/2015 Echo: EF 45-50%; e. 01/2016 MUGA: EF 60%; f. 06/2016 MUGA EF 65%; g. 10/2016 MUGA: EF 61%;  h. 12/2016 Echo: EF 55-60%, gr1 DD.  Marland Kitchen Hyperlipidemia   . Hypertension   . Neuropathy   . Possible PAD (peripheral artery disease) ([place])    a. 11/2016 LE cyanosis and weak pulses-->CTA w/o significant Ao-BiFem dzs. ? distal dzs-->ASA/Plavix initiated by vascular surgery.  Marland Kitchen PSVT (paroxysmal supraventricular tachycardia) ([place])    a. Dx 11/2016.  . Pulmonary embolism ([place])    a. 12/2016 CTA Chest: small nonocclusive PE in inferior segment of the Left lingula, somewhat eccentric filling defect suggesting chronic rather than acute embolic event; b. 02/9628 LE U/S:  No DVT; c. 12/2016 Echo: Nl RV fxn, nl PASP.  Marland Kitchen Recurrent Metastatic breast cancer ([place])    a. Dx 2016: Stage II, ER positive, PR positive, HER-2/neu overexpressing of the left breast-->chemo/radiation; b. 10/2016 CT Abd/pelvis: diffuse liver mets, ill defined sclerotic bone  lesions-T12;  c. 10/2016 MRI brain: metastatic lesion along L temporal lobe (19x48m) w/ extensive surrounding edema & 567mmidline shift to right.  . Sinus tachycardia     Past Surgical History:  Procedure Laterality Date  . BREAST BIOPSY Left 2016   Positive  . BREAST LUMPECTOMY WITH SENTINEL LYMPH NODE BIOPSY Left 05/23/2015   Procedure: LEFT BREAST WIDE EXCISION WITH AXILLARY DISSECTION, MASTOPLASTY ;  Surgeon: JeRobert BellowMD;  Location: [facility] ORS;  Service: General;  Laterality: Left;  . BREAST SURGERY Left 12/18/14   breast biopsy/INVASIVE DUCTAL CARCINOMA OF BREAST, NOTTINGHAM GRADE 2.  . Marland KitchenREAST SURGERY  05/23/2015.   Wide excision/mastoplasty, axillary dissection. No residual invasive cancer, positive for residual DCIS. 0/2 nodes identified on axillary dissection. (no SLN by technetium or methylene blue)  . CARDIAC CATHETERIZATION    . COLONOSCOPY WITH PROPOFOL N/A 12/10/2016   Procedure: COLONOSCOPY WITH PROPOFOL;  Surgeon: DaLucilla LameMD;  Location: [facility] ENDOSCOPY;  Service: Endoscopy;  Laterality: N/A;  . COLONOSCOPY WITH PROPOFOL N/A 02/13/2017   Procedure: COLONOSCOPY WITH PROPOFOL;  Surgeon: WoLucilla LameMD;  [facility];  Service: Endoscopy;  Laterality: N/A;  . ESOPHAGOGASTRODUODENOSCOPY (EGD) WITH PROPOFOL N/A 12/08/2016   Procedure: ESOPHAGOGASTRODUODENOSCOPY (EGD) WITH PROPOFOL;  Surgeon: DaLucilla LameMD;  Location: [facility] ENDOSCOPY;  Service: Endoscopy;  Laterality: N/A;  . IVC FILTER INSERTION N/A 02/15/2017   Procedure: IVC Filter Insertion;  Surgeon: DeAlgernon HuxleyMD;  [facility];  Service: Cardiovascular;  Laterality: N/A;  . PORTACATH PLACEMENT Right 12-31-14  Dr Bary Castilla    There were no vitals filed for this visit.      Subjective Assessment - 06/24/17 1423    Subjective  I was doing the putty for about 10 min at time every day - neuropathy still about the same             [facility] OT Assessment - 06/24/17 0001       Strength   Right Hand Grip (lbs) 38   Right Hand Lateral Pinch 8 lbs   Right Hand 3 Point Pinch 8 lbs   Left Hand Grip (lbs) 32   Left Hand Lateral Pinch 8 lbs   Left Hand 3 Point Pinch 8 lbs            Pt showed increase grip on the R , L stayed the same in last 3 wks   prehension on R and L increased with 2 lbs - to 8 lbs  Pt to cont with same putty  For month and can contact me if need to remeasure  Pt still below the range for her age in grip and prehension                    OT Long Term Goals - 06/03/17 1509      OT LONG TERM GOAL #1   Title Patient will demonstrate home exercise program with modified independence.    Status Achieved     OT LONG TERM GOAL #2   Title Patient will increase grip strength in bilateral hands by 10# to assist with holding objects and opening containers.    Baseline R 30 and L 32 lbs    Status Achieved     OT LONG TERM GOAL #3   Title Patient will complete peeling and cutting vegetables with modified independence using adaptive equipment as needed.    Baseline not using AE - can do it without   Status Achieved     OT LONG TERM GOAL #4   Title Patient will improve pinch by 2# to assist with pulling button out on washer with modified independence.    Baseline pinch 6-7 lbs L and R    Status Achieved     OT LONG TERM GOAL #5   Title Patient will report pain in bilateral hands 2 or less to perform daily tasks.    Baseline Pain still neuropathy 5-6/10 pain    Status On-going             Patient will benefit from skilled therapeutic intervention in order to improve the following deficits and impairments:     Visit Diagnosis: Peripheral neuropathy due to chemotherapy [facility])    Problem List Patient Active Problem List   Diagnosis Date Noted  . Brain metastasis ([place]) 05/19/2017  . Blood in stool   . Polyp of sigmoid colon   . Benign neoplasm of transverse colon   . Benign neoplasm of ascending colon   .  Lower GI bleed   . Weakness of right lower extremity 02/11/2017  . Orthostatic lightheadedness 01/14/2017  . Atrial tachycardia ([place]) 01/14/2017  . Hypokalemia 01/14/2017  . Pulmonary emboli ([place]) 01/12/2017  . Dehydration 12/25/2016  . Blood loss anemia 12/15/2016  . Ulceration of colon   . Heartburn   . Duodenitis   . Gastritis without bleeding   . GI bleed   . Acute esophagitis   . Acute esophagogastric ulcer   . Rectal bleed   . Hypovolemic shock ([place])   .  Ischemic leg 12/04/2016  . Chemotherapy induced neutropenia ([place]) 11/20/2016  . Palliative care by specialist   . Goals of care, counseling/discussion   . DNR (do not resuscitate) discussion   . Cerebral edema ([place]) 11/10/2016  . Encounter for monitoring cardiotoxic drug therapy 11/09/2016  . Elevated LFTs 10/20/2016  . URI with cough and congestion 08/25/2016  . Peripheral neuropathy due to chemotherapy ([place]) 03/13/2016  . Encounter for antineoplastic chemotherapy 03/13/2016  . HLD (hyperlipidemia) 01/29/2016  . Hyperglycemia, unspecified 01/29/2016  . Cardiomyopathy ([place]) 01/13/2016  . Hypertension 07/18/2015  . Carcinoma of upper-outer quadrant of left breast in female, estrogen receptor positive ([place]) 12/27/2014    Rosalyn Gess OTR/L,CLT 06/24/2017, 2:25 PM  [facility] [address] [place], [address] Phone: [phone]   Fax:  [phone]  Name: YATZIL CLIPPINGER MRN: [id number] Date of Birth: 12-01-1955

## 2017-06-29 ENCOUNTER — Ambulatory Visit: Payer: BLUE CROSS/BLUE SHIELD | Attending: Internal Medicine

## 2017-06-29 ENCOUNTER — Encounter: Payer: Self-pay | Admitting: Internal Medicine

## 2017-06-29 ENCOUNTER — Encounter: Payer: BLUE CROSS/BLUE SHIELD | Admitting: Occupational Therapy

## 2017-06-29 DIAGNOSIS — R262 Difficulty in walking, not elsewhere classified: Secondary | ICD-10-CM | POA: Diagnosis not present

## 2017-06-29 DIAGNOSIS — M6281 Muscle weakness (generalized): Secondary | ICD-10-CM | POA: Insufficient documentation

## 2017-06-29 NOTE — Therapy (Signed)
[facility] [address], [place], [address] Phone: [phone]   Fax:  [phone]  Physical Therapy Treatment  Patient Details  Name: Sarah Horne MRN: [id number] Date of Birth: [birth year] Referring Provider: Charlaine Dalton, MD  Encounter Date: 06/29/2017      PT End of Session - 06/29/17 1302    Visit Number 19   Number of Visits 22   Date for PT Re-Evaluation 07/01/17   PT Start Time 3810   PT Stop Time 1345   PT Time Calculation (min) 42 min   Equipment Utilized During Treatment Gait belt   Activity Tolerance Patient tolerated treatment well   Behavior During Therapy [facility] for tasks assessed/performed      Past Medical History:  Diagnosis Date  . Chemotherapy-induced peripheral neuropathy ([place])   . Epistaxis    a. 11/2016 in setting of asa/plavix-->silver nitrate cauterization.  . GI bleed    a. 11/2016 Admission w/ GIB and hypovolemic shock req 3u PRBC's;  b. 11/2016 ECG: gastritis & nonbleeding peptic ulcer; c. 11/2016 Conlonoscopy: rectal and sigmoid colonic ulcers.  . Heart attack ([place])    a. 1998 Cath @ [facility]: reportedly no intervention required.  Marland Kitchen Herceptin-induced cardiomyopathy ([place])    a. In the setting of Herceptin Rx for breast cancer (initiated 12/2014); b. 03/2015 MUGA EF 64%; b. 08/2015 MUGA: EF 51%; c. 10/2015 MUGA: EF 44%; d. 11/2015 Echo: EF 45-50%; e. 01/2016 MUGA: EF 60%; f. 06/2016 MUGA EF 65%; g. 10/2016 MUGA: EF 61%;  h. 12/2016 Echo: EF 55-60%, gr1 DD.  Marland Kitchen Hyperlipidemia   . Hypertension   . Neuropathy   . Possible PAD (peripheral artery disease) ([place])    a. 11/2016 LE cyanosis and weak pulses-->CTA w/o significant Ao-BiFem dzs. ? distal dzs-->ASA/Plavix initiated by vascular surgery.  Marland Kitchen PSVT (paroxysmal supraventricular tachycardia) ([place])    a. Dx 11/2016.  . Pulmonary embolism ([place])    a. 12/2016 CTA Chest: small nonocclusive PE in inferior segment of the Left lingula, somewhat  eccentric filling defect suggesting chronic rather than acute embolic event; b. 09/7508 LE U/S:  No DVT; c. 12/2016 Echo: Nl RV fxn, nl PASP.  Marland Kitchen Recurrent Metastatic breast cancer ([place])    a. Dx 2016: Stage II, ER positive, PR positive, HER-2/neu overexpressing of the left breast-->chemo/radiation; b. 10/2016 CT Abd/pelvis: diffuse liver mets, ill defined sclerotic bone lesions-T12;  c. 10/2016 MRI brain: metastatic lesion along L temporal lobe (19x73m) w/ extensive surrounding edema & 518mmidline shift to right.  . Sinus tachycardia     Past Surgical History:  Procedure Laterality Date  . BREAST BIOPSY Left 2016   Positive  . BREAST LUMPECTOMY WITH SENTINEL LYMPH NODE BIOPSY Left 05/23/2015   Procedure: LEFT BREAST WIDE EXCISION WITH AXILLARY DISSECTION, MASTOPLASTY ;  Surgeon: JeRobert BellowMD;  Location: [facility] ORS;  Service: General;  Laterality: Left;  . BREAST SURGERY Left 12/18/14   breast biopsy/INVASIVE DUCTAL CARCINOMA OF BREAST, NOTTINGHAM GRADE 2.  . Marland KitchenREAST SURGERY  05/23/2015.   Wide excision/mastoplasty, axillary dissection. No residual invasive cancer, positive for residual DCIS. 0/2 nodes identified on axillary dissection. (no SLN by technetium or methylene blue)  . CARDIAC CATHETERIZATION    . COLONOSCOPY WITH PROPOFOL N/A 12/10/2016   Procedure: COLONOSCOPY WITH PROPOFOL;  Surgeon: DaLucilla LameMD;  Location: [facility] ENDOSCOPY;  Service: Endoscopy;  Laterality: N/A;  . COLONOSCOPY WITH PROPOFOL N/A 02/13/2017   Procedure: COLONOSCOPY WITH PROPOFOL;  Surgeon: WoLucilla Lame  MD;  Location: [facility] ENDOSCOPY;  Service: Endoscopy;  Laterality: N/A;  . ESOPHAGOGASTRODUODENOSCOPY (EGD) WITH PROPOFOL N/A 12/08/2016   Procedure: ESOPHAGOGASTRODUODENOSCOPY (EGD) WITH PROPOFOL;  Surgeon: Lucilla Lame, MD;  Location: [facility] ENDOSCOPY;  Service: Endoscopy;  Laterality: N/A;  . IVC FILTER INSERTION N/A 02/15/2017   Procedure: IVC Filter Insertion;  Surgeon: Algernon Huxley, MD;  Location: [facility];  Service: Cardiovascular;  Laterality: N/A;  . PORTACATH PLACEMENT Right 12-31-14   Dr Bary Castilla    There were no vitals filed for this visit.      Subjective Assessment - 06/29/17 1305    Subjective Walking is better. Does not even think about her cane. The bending in her R ankle is about the same (R ankle DF).    Pertinent History Bilateral UE and LE weakness. Pt states that she feels like her weakness came from her neuropathy. Difficulty writing. Both hands feel numb currently but can feel her hand when she touches it. Has had the weakness for about 3 months (since April 2018) which began gradually.  Went through chemo and radiation last year for about 2.5 months until August 2017.  Still has cancer in her L breast which also traveled to her brain and liver.   Pt also lost 10 lbs since she was diagnosed with cancer 2 years ago.  Apetite is fine. Normally eats breakfast at times. Normally eats one good meal during the day and eats junk in between such as cookies and sodas.  Eats a frozen dinner during lunch. Does not know if she gets enough protein.   Only has pain in her feet and hands bilaterally.  No neck or back pain.   Has been using a rw since May 2018.  Was independent with gait before May 2018.  Pt goal is to improve her function.    Patient Stated Goals I want to be able to get out of the walker and not use a cane. Be able to do all the things she used to do such as cooking a meal, laundry,    Currently in Pain? Other (Comment)  No pain other than hands and feet                                 PT Education - 06/29/17 1308    Education provided Yes   Education Details ther-ex   [employer]) Educated Patient   Methods Explanation;Demonstration;Tactile cues;Verbal cues   Comprehension Returned demonstration;Verbalized understanding        Objectives   There-ex  Blood pressure R arm sitting, mechanically taken: 109/70, HR 89at start of  session. SpO2 99% room Air   Ankle DF on rocker board with bilateral UE assist 2 min with 5 second gastroc stretches.   standing bilateral calf stretch at stair step with L UE assist 30 seconds x 3  Static mini lunge 10x3 each side to promote ankle DF  Seated R ankle DF with PT assist to end range 10x2 with 5 second holds at end range  Side stepping 32 ft to the L and 32 ft to the R 2x  Wedding march 32 ft x 2   Gait in different directions: diagonal (forward, back for both L and R directions), forward, back, lateral 5 ft each 2x   Gait around gym with changes in gait speed, stops and turns. CGA. No LOB. Able to perform with ease. Good changes in gait speed  observed. Able to perform sudden stops.   Stepping over 2 fallen quad canes 3x leading with each LE. Able to peform with ease. CGA. No LOB. No AD.  Heel walking 6 ft x6 to promote ankle DF.  Sit <> stand from regular chair Bilateral UE assist to stand, no UE assist to sit 10x2. Cues for femoral control and proper foot placement.   Forward step up onto and over Air Ex pad with light touch to no UE assist 10x each LE. No UE assist. No LOB   Improved exercise technique, movement at target joints, use of target muscles after min to mod verbal, visual, tactile cues.      Able to perform balance activities without AD and no LOB. Continued working on R ankle DF to promote ability to better perform R foot clearance during gait.           PT Long Term Goals - 06/22/17 1137      PT LONG TERM GOAL #1   Title Patient will be able to perform her TUG time using a rw to 18 seconds or less to promote functional mobility.   Baseline 25.3 seconds average with rw (04/20/2017); 12 seconds on average with SPC (05/20/2017)   Time 6   Period Weeks   Status Achieved     PT LONG TERM GOAL #2   Title Patient will improve bilateral UE and bilateral hip, and knee strength to promote ability to perform functional tasks  at home.    Time 4   Period Weeks   Status On-going     PT LONG TERM GOAL #3   Title Patient will improve her LEFS score by at least 9 points as a demonstration of improved function.    Baseline 25/80 (04/20/2017); 57/80 (05/20/2017)   Time 6   Period Weeks   Status Achieved     PT LONG TERM GOAL #4   Title Patient will improve her Quick Dash Disability/Symptom score by at least 15% as a demonstration of improved function.    Baseline 70.45% (04/20/2017); 25% (05/20/2017)   Time 6   Period Weeks   Status Achieved     PT LONG TERM GOAL #5   Title Patient will be able to perform her TUG time without AD to 12 seconds or less to promote functional mobility.   Baseline 12 seconds with SPC (05/20/2017); 10 seconds average without AD (06/01/2017)   Time 4   Period Weeks   Status Achieved     PT LONG TERM GOAL #6   Title Pt will be able to ambulate at least 6 minutes without AD with SBA and no LOB to promote mobility and function.    Baseline able to ambulate 400 ft without AD with CGA, no LOB (05/20/2017); able to ambulate 1042 ft in 6 min without AD and with SBA, no LOB (06/01/2017)    Time 4   Period Weeks   Status Achieved     PT LONG TERM GOAL #7   Title Pt will improve supine R ankle DF AROM to - 17 degrees or less to promote foot clearance during gait.     Baseline -28 degrees (06/01/2017); -18 degrees (06/22/2017)   Time 4   Period Weeks   Status Partially Met   Target Date 07/01/17     PT LONG TERM GOAL #8   Title Pt will improve her BERG balance score to 47/56 or more as a demonstration of improved balance.    Baseline 42/56 (  05/25/2017); 50/56 (06/22/2017)   Time 4   Period Days   Status Achieved   Target Date 07/01/17               Plan - 06/29/17 1309    Clinical Impression Statement Able to perform balance activities without AD and no LOB. Continued working on R ankle DF to promote ability to better perform R foot clearance during gait.   History and Personal  Factors relevant to plan of care: hx of CA, weakness, multiple health problems   Clinical Presentation Stable   Clinical Presentation due to: Pt making progress towards goals   Clinical Decision Making Low   Rehab Potential Fair   Clinical Impairments Affecting Rehab Potential hx of CA, multiple health problems   PT Frequency 2x / week   PT Duration 6 weeks   PT Treatment/Interventions Manual techniques;Patient/family education;Therapeutic activities;Therapeutic exercise;Gait training;Neuromuscular re-education;Balance training   PT Next Visit Plan hip, scapular, LE, UE strengthening, gait, balance, femoral control   Consulted and Agree with Plan of Care Patient      Patient will benefit from skilled therapeutic intervention in order to improve the following deficits and impairments:  Pain, Improper body mechanics, Postural dysfunction, Impaired UE functional use, Decreased strength, Difficulty walking, Decreased endurance  Visit Diagnosis: Muscle weakness (generalized)  Difficulty in walking, not elsewhere classified     Problem List Patient Active Problem List   Diagnosis Date Noted  . Brain metastasis ([place]) 05/19/2017  . Blood in stool   . Polyp of sigmoid colon   . Benign neoplasm of transverse colon   . Benign neoplasm of ascending colon   . Lower GI bleed   . Weakness of right lower extremity 02/11/2017  . Orthostatic lightheadedness 01/14/2017  . Atrial tachycardia ([place]) 01/14/2017  . Hypokalemia 01/14/2017  . Pulmonary emboli ([employer]) 01/12/2017  . Dehydration 12/25/2016  . Blood loss anemia 12/15/2016  . Ulceration of colon   . Heartburn   . Duodenitis   . Gastritis without bleeding   . GI bleed   . Acute esophagitis   . Acute esophagogastric ulcer   . Rectal bleed   . Hypovolemic shock ([place])   . Ischemic leg 12/04/2016  . Chemotherapy induced neutropenia ([place]) 11/20/2016  . Palliative care by specialist   . Goals of care, counseling/discussion   . DNR (do  not resuscitate) discussion   . Cerebral edema ([place]) 11/10/2016  . Encounter for monitoring cardiotoxic drug therapy 11/09/2016  . Elevated LFTs 10/20/2016  . URI with cough and congestion 08/25/2016  . Peripheral neuropathy due to chemotherapy ([place]) 03/13/2016  . Encounter for antineoplastic chemotherapy 03/13/2016  . HLD (hyperlipidemia) 01/29/2016  . Hyperglycemia, unspecified 01/29/2016  . Cardiomyopathy ([place]) 01/13/2016  . Hypertension 07/18/2015  . Carcinoma of upper-outer quadrant of left breast in female, estrogen receptor positive ([place]) 12/27/2014   Joneen Boers PT, DPT   06/29/2017, 8:13 PM  [facility] [address] [place], [address] Phone: [phone]   Fax:  [phone]  Name: Sarah Horne MRN: [id number] Date of Birth: [birth year]

## 2017-07-01 ENCOUNTER — Ambulatory Visit: Payer: BLUE CROSS/BLUE SHIELD

## 2017-07-01 ENCOUNTER — Encounter: Payer: BLUE CROSS/BLUE SHIELD | Admitting: Occupational Therapy

## 2017-07-01 ENCOUNTER — Other Ambulatory Visit: Payer: Self-pay | Admitting: Family Medicine

## 2017-07-01 DIAGNOSIS — M6281 Muscle weakness (generalized): Secondary | ICD-10-CM | POA: Diagnosis not present

## 2017-07-01 DIAGNOSIS — J069 Acute upper respiratory infection, unspecified: Secondary | ICD-10-CM

## 2017-07-01 DIAGNOSIS — R262 Difficulty in walking, not elsewhere classified: Secondary | ICD-10-CM | POA: Diagnosis not present

## 2017-07-01 NOTE — Therapy (Signed)
[place] [facility] [address] [place], [address] Phone: [phone]   Fax:  [phone]  Physical Therapy Treatment And  Discharge Summary  Patient Details  Name: Sarah Horne MRN: [id number] Date of Birth: 05-Sep-1956 Referring Provider: Charlaine Dalton, MD  Encounter Date: 07/01/2017      PT End of Session - 07/01/17 1306    Visit Number 20   Number of Visits 22   Date for PT Re-Evaluation 07/01/17   PT Start Time 6962   PT Stop Time 1349   PT Time Calculation (min) 43 min   Equipment Utilized During Treatment Gait belt   Activity Tolerance Patient tolerated treatment well   Behavior During Therapy [facility] for tasks assessed/performed      Past Medical History:  Diagnosis Date  . Chemotherapy-induced peripheral neuropathy ([place])   . Epistaxis    a. 11/2016 in setting of asa/plavix-->silver nitrate cauterization.  . GI bleed    a. 11/2016 Admission w/ GIB and hypovolemic shock req 3u PRBC's;  b. 11/2016 ECG: gastritis & nonbleeding peptic ulcer; c. 11/2016 Conlonoscopy: rectal and sigmoid colonic ulcers.  . Heart attack ([place])    a. 1998 Cath @ [facility]: reportedly no intervention required.  Marland Kitchen Herceptin-induced cardiomyopathy ([place])    a. In the setting of Herceptin Rx for breast cancer (initiated 12/2014); b. 03/2015 MUGA EF 64%; b. 08/2015 MUGA: EF 51%; c. 10/2015 MUGA: EF 44%; d. 11/2015 Echo: EF 45-50%; e. 01/2016 MUGA: EF 60%; f. 06/2016 MUGA EF 65%; g. 10/2016 MUGA: EF 61%;  h. 12/2016 Echo: EF 55-60%, gr1 DD.  Marland Kitchen Hyperlipidemia   . Hypertension   . Neuropathy   . Possible PAD (peripheral artery disease) ([place])    a. 11/2016 LE cyanosis and weak pulses-->CTA w/o significant Ao-BiFem dzs. ? distal dzs-->ASA/Plavix initiated by vascular surgery.  Marland Kitchen PSVT (paroxysmal supraventricular tachycardia) ([place])    a. Dx 11/2016.  . Pulmonary embolism ([place])    a. 12/2016 CTA Chest: small nonocclusive PE in inferior segment of the  Left lingula, somewhat eccentric filling defect suggesting chronic rather than acute embolic event; b. 05/5283 LE U/S:  No DVT; c. 12/2016 Echo: Nl RV fxn, nl PASP.  Marland Kitchen Recurrent Metastatic breast cancer ([place])    a. Dx 2016: Stage II, ER positive, PR positive, HER-2/neu overexpressing of the left breast-->chemo/radiation; b. 10/2016 CT Abd/pelvis: diffuse liver mets, ill defined sclerotic bone lesions-T12;  c. 10/2016 MRI brain: metastatic lesion along L temporal lobe (19x55m) w/ extensive surrounding edema & 562mmidline shift to right.  . Sinus tachycardia     Past Surgical History:  Procedure Laterality Date  . BREAST BIOPSY Left 2016   Positive  . BREAST LUMPECTOMY WITH SENTINEL LYMPH NODE BIOPSY Left 05/23/2015   Procedure: LEFT BREAST WIDE EXCISION WITH AXILLARY DISSECTION, MASTOPLASTY ;  Surgeon: JeRobert BellowMD;  Location: [facility] ORS;  Service: General;  Laterality: Left;  . BREAST SURGERY Left 12/18/14   breast biopsy/INVASIVE DUCTAL CARCINOMA OF BREAST, NOTTINGHAM GRADE 2.  . Marland KitchenREAST SURGERY  05/23/2015.   Wide excision/mastoplasty, axillary dissection. No residual invasive cancer, positive for residual DCIS. 0/2 nodes identified on axillary dissection. (no SLN by technetium or methylene blue)  . CARDIAC CATHETERIZATION    . COLONOSCOPY WITH PROPOFOL N/A 12/10/2016   Procedure: COLONOSCOPY WITH PROPOFOL;  Surgeon: DaLucilla LameMD;  Location: [facility] ENDOSCOPY;  Service: Endoscopy;  Laterality: N/A;  . COLONOSCOPY WITH PROPOFOL N/A 02/13/2017   Procedure: COLONOSCOPY WITH PROPOFOL;  Surgeon: Lucilla Lame, MD;  Location[facility];  Service: Endoscopy;  Laterality: N/A;  . ESOPHAGOGASTRODUODENOSCOPY (EGD) WITH PROPOFOL N/A 12/08/2016   Procedure: ESOPHAGOGASTRODUODENOSCOPY (EGD) WITH PROPOFOL;  Surgeon: Lucilla Lame, MD;  Location: [facility] ENDOSCOPY;  Service: Endoscopy;  Laterality: N/A;  . IVC FILTER INSERTION N/A 02/15/2017   Procedure: IVC Filter Insertion;  Surgeon: Algernon Huxley, MD;   Location: [place] CV LAB;  Service: Cardiovascular;  Laterality: N/A;  . PORTACATH PLACEMENT Right 12-31-14   Dr Bary Castilla    There were no vitals filed for this visit.      Subjective Assessment - 07/01/17 1310    Subjective No pain or discomfort other than her hands and feet. L thumb is hurting. Going to ask her OT about it.  Walking is fine. Pt states feeling comfortable with exercises at home.    Pertinent History Bilateral UE and LE weakness. Pt states that she feels like her weakness came from her neuropathy. Difficulty writing. Both hands feel numb currently but can feel her hand when she touches it. Has had the weakness for about 3 months (since April 2018) which began gradually.  Went through chemo and radiation last year for about 2.5 months until August 2017.  Still has cancer in her L breast which also traveled to her brain and liver.   Pt also lost 10 lbs since she was diagnosed with cancer 2 years ago.  Apetite is fine. Normally eats breakfast at times. Normally eats one good meal during the day and eats junk in between such as cookies and sodas.  Eats a frozen dinner during lunch. Does not know if she gets enough protein.   Only has pain in her feet and hands bilaterally.  No neck or back pain.   Has been using a rw since May 2018.  Was independent with gait before May 2018.  Pt goal is to improve her function.    Patient Stated Goals I want to be able to get out of the walker and not use a cane. Be able to do all the things she used to do such as cooking a meal, laundry,    Currently in Pain? Other (Comment)  see subjective            [facility] PT Assessment - 07/01/17 1312      AROM   Right Ankle Dorsiflexion -9     Strength   Right Shoulder Flexion 4+/5   Right Shoulder ABduction 4+/5   Right Shoulder Internal Rotation 4+/5   Right Shoulder External Rotation 4+/5   Left Shoulder Flexion 5/5   Left Shoulder ABduction 5/5   Left Shoulder Internal Rotation 4+/5   Left  Shoulder External Rotation 4/5   Right Elbow Flexion 4+/5   Right Elbow Extension 4+/5   Left Elbow Flexion 4+/5   Left Elbow Extension 4+/5   Right Hip Flexion 4/5   Right Hip ABduction 4+/5   Left Hip Flexion 4/5   Left Hip ABduction 4/5   Right Knee Flexion 4/5   Right Knee Extension 5/5   Left Knee Flexion 4+/5   Left Knee Extension 5/5   Right Ankle Dorsiflexion 2+/5   Right Ankle Plantar Flexion 4+/5  manual resistance   Left Ankle Dorsiflexion 4+/5   Left Ankle Plantar Flexion 5/5  manual resistance                             PT Education -  07/01/17 1352    Education provided Yes   Education Details ther-ex, progress   Person(s) Educated Patient   Methods Explanation;Demonstration;Tactile cues;Verbal cues   Comprehension Returned demonstration;Verbalized understanding        Objectives  Pt states feeling comfortable with exercises at home.    There-ex  Blood pressure R arm sitting, mechanically taken: 106/68, HR 93at start of session. SpO2 99% room Air   Supine R ankle DF AROM  -9 degrees   Manually resisted S/L hip abduction, seated hip flexion, knee flexion, knee extension, ankle DF, ankle PF, shoulder flexion, abduction, ER, IR, elbow flexion, elbow extension   Reviewed progress with strength with pt.    Ankle DF on rocker board with bilateral UE assist 2 min with 5 second gastroc stretches.    Seated R ankle DF with PT assist to end range 10x2 with 5 second holds at end range   Gait in different directions: diagonal (forward, back for both L and R directions), forward, back, lateral 5 ft each 2x  Gait around gym with changes in gait speed, stops and turns. CGA. No LOB. Able to perform with ease. Good changes in gait speed observed. Able to perform sudden stops.    Improved exercise technique, movement at target joints, use of target muscles after min to mod verbal, visual, tactile cues.    Pt demonstrates improved  bilateral UE and LE strength, R ankle DF ROM, balance, ability to ambulate without AD, and ability to perform functional tasks at home. Pt has made very good progress with PT towards goals and pt demonstrates independence with her HEP. Skilled PT services discharged with pt continuing progress with her exercises at home.          PT Long Term Goals - 07/01/17 1333      PT LONG TERM GOAL #1   Title Patient will be able to perform her TUG time using a rw to 18 seconds or less to promote functional mobility.   Baseline 25.3 seconds average with rw (04/20/2017); 12 seconds on average with SPC (05/20/2017)   Time 6   Period Weeks   Status Achieved     PT LONG TERM GOAL #2   Title Patient will improve bilateral UE and bilateral hip, and knee strength to promote ability to perform functional tasks at home.    Time 4   Period Weeks   Status Achieved  Potentially improved all muscles tested except L hip abduction     PT LONG TERM GOAL #3   Title Patient will improve her LEFS score by at least 9 points as a demonstration of improved function.    Baseline 25/80 (04/20/2017); 57/80 (05/20/2017)   Time 6   Period Weeks   Status Achieved     PT LONG TERM GOAL #4   Title Patient will improve her Quick Dash Disability/Symptom score by at least 15% as a demonstration of improved function.    Baseline 70.45% (04/20/2017); 25% (05/20/2017)   Time 6   Period Weeks   Status Achieved     PT LONG TERM GOAL #5   Title Patient will be able to perform her TUG time without AD to 12 seconds or less to promote functional mobility.   Baseline 12 seconds with SPC (05/20/2017); 10 seconds average without AD (06/01/2017)   Time 4   Period Weeks   Status Achieved     PT LONG TERM GOAL #6   Title Pt will be able to ambulate at least  6 minutes without AD with SBA and no LOB to promote mobility and function.    Baseline able to ambulate 400 ft without AD with CGA, no LOB (05/20/2017); able to ambulate 1042 ft in 6  min without AD and with SBA, no LOB (06/01/2017)    Time 4   Period Weeks   Status Achieved     PT LONG TERM GOAL #7   Title Pt will improve supine R ankle DF AROM to - 17 degrees or less to promote foot clearance during gait.     Baseline -28 degrees (06/01/2017); -18 degrees (06/22/2017), -9 degrees (07/01/2017)   Time 4   Period Weeks   Status Achieved     PT LONG TERM GOAL #8   Title Pt will improve her BERG balance score to 47/56 or more as a demonstration of improved balance.    Baseline 42/56 (05/25/2017); 50/56 (06/22/2017)   Time 4   Period Days   Status Achieved               Plan - 07/01/17 1353    Clinical Impression Statement Pt demonstrates improved bilateral UE and LE strength, R ankle DF ROM, balance, ability to ambulate without AD, and ability to perform functional tasks at home. Pt has made very good progress with PT towards goals and pt demonstrates independence with her HEP. Skilled PT services discharged with pt continuing progress with her exercises at home.    History and Personal Factors relevant to plan of care: hx of CA, weakness, multiple health problems   Clinical Presentation Stable   Clinical Presentation due to: Pt progressed very well with PT towards goals   Clinical Decision Making Low   Rehab Potential --   Clinical Impairments Affecting Rehab Potential --   PT Frequency --   PT Duration --   PT Treatment/Interventions Manual techniques;Patient/family education;Therapeutic activities;Therapeutic exercise;Gait training;Neuromuscular re-education;Balance training   PT Next Visit Plan Continue progress with her exercises at home   Consulted and Agree with Plan of Care Patient      Patient will benefit from skilled therapeutic intervention in order to improve the following deficits and impairments:  Pain, Improper body mechanics, Postural dysfunction, Impaired UE functional use, Decreased strength, Difficulty walking, Decreased endurance  Visit  Diagnosis: Muscle weakness (generalized)  Difficulty in walking, not elsewhere classified     Problem List Patient Active Problem List   Diagnosis Date Noted  . Brain metastasis ([place]) 05/19/2017  . Blood in stool   . Polyp of sigmoid colon   . Benign neoplasm of transverse colon   . Benign neoplasm of ascending colon   . Lower GI bleed   . Weakness of right lower extremity 02/11/2017  . Orthostatic lightheadedness 01/14/2017  . Atrial tachycardia ([place]) 01/14/2017  . Hypokalemia 01/14/2017  . Pulmonary emboli ([place]) 01/12/2017  . Dehydration 12/25/2016  . Blood loss anemia 12/15/2016  . Ulceration of colon   . Heartburn   . Duodenitis   . Gastritis without bleeding   . GI bleed   . Acute esophagitis   . Acute esophagogastric ulcer   . Rectal bleed   . Hypovolemic shock ([place])   . Ischemic leg 12/04/2016  . Chemotherapy induced neutropenia ([place]) 11/20/2016  . Palliative care by specialist   . Goals of care, counseling/discussion   . DNR (do not resuscitate) discussion   . Cerebral edema ([place]) 11/10/2016  . Encounter for monitoring cardiotoxic drug therapy 11/09/2016  . Elevated LFTs 10/20/2016  . Peripheral  neuropathy due to chemotherapy ([place]) 03/13/2016  . Encounter for antineoplastic chemotherapy 03/13/2016  . HLD (hyperlipidemia) 01/29/2016  . Hyperglycemia, unspecified 01/29/2016  . Cardiomyopathy ([place]) 01/13/2016  . Hypertension 07/18/2015  . Carcinoma of upper-outer quadrant of left breast in female, estrogen receptor positive ([place]) 12/27/2014   Thank you for your referral.  Joneen Boers PT, DPT  07/01/2017, 1:58 PM  [place] [facility] [address], [place], [address] Phone: [phone]   Fax:  [phone]  Name: MINAHIL QUINLIVAN MRN: [id number] Date of Birth: 04-11-1956

## 2017-07-01 NOTE — Telephone Encounter (Signed)
This was given as one time medication for URI. Pt needs to speak with her PCP, Dr. Manuella Ghazi, if she is having continued symptoms.

## 2017-07-08 ENCOUNTER — Telehealth: Payer: Self-pay | Admitting: *Deleted

## 2017-07-08 NOTE — Telephone Encounter (Signed)
Patient called stating [facility] told her that her Verzenio needs a prior authorization and will not send to her.

## 2017-07-09 ENCOUNTER — Telehealth: Payer: Self-pay | Admitting: *Deleted

## 2017-07-09 ENCOUNTER — Telehealth: Payer: Self-pay | Admitting: Internal Medicine

## 2017-07-09 DIAGNOSIS — Z170 Estrogen receptor positive status [ER+]: Principal | ICD-10-CM

## 2017-07-09 DIAGNOSIS — C50412 Malignant neoplasm of upper-outer quadrant of left female breast: Secondary | ICD-10-CM

## 2017-07-09 NOTE — Telephone Encounter (Signed)
Oral Oncology Patient Advocate Encounter  Reviewed a call from patient that [facility] needed a PA from her insurance to process her Verzenio. Did PA and it came back that we had the wrong insurance information for her.  I called the patient and left a message to please call us.  I will continue to follow up.  [place] Patient Advocate [phone] 07/09/2017 9:30 AM

## 2017-07-09 NOTE — Telephone Encounter (Addendum)
She still has not gotten her medication and [facility] states they have not heard from [place]

## 2017-07-09 NOTE — Telephone Encounter (Signed)
Oral Oncology Patient Advocate Encounter  Reviewed a call from patient that [facility] needed a PA from her insurance to process her Verzenio. Did PA and it came back that we had the wrong insurance information for her.  I called the patient and left a message to please call us.  I will continue to follow up.  [place] Patient Advocate [phone] 07/09/2017 9:30 AM

## 2017-07-12 MED ORDER — ABEMACICLIB 50 MG PO TABS: 50.0000 mg | ORAL_TABLET | Freq: Every day | ORAL | 3 refills | Status: DC

## 2017-07-12 NOTE — Telephone Encounter (Addendum)
Oral Chemotherapy Pharmacist Encounter  After a few phone calls, I was able to find out that Due to insurance requirement, her Verzenio needs to now be filled at [facility] instead of [facility].   Prescription was e-scribed to [facility] and supportive information was faxed over.   Called patient to update her. She is aware that she needs to look at for calls from [facility] about her medication.   Thank you,  Darl Pikes, PharmD, BCPS Hematology/Oncology Clinical Pharmacist [facility] [phone]  07/12/2017 2:15 PM

## 2017-07-14 ENCOUNTER — Inpatient Hospital Stay: Payer: BLUE CROSS/BLUE SHIELD | Attending: Internal Medicine

## 2017-07-14 ENCOUNTER — Inpatient Hospital Stay: Payer: BLUE CROSS/BLUE SHIELD

## 2017-07-14 ENCOUNTER — Inpatient Hospital Stay (HOSPITAL_BASED_OUTPATIENT_CLINIC_OR_DEPARTMENT_OTHER): Payer: BLUE CROSS/BLUE SHIELD | Admitting: Internal Medicine

## 2017-07-14 ENCOUNTER — Other Ambulatory Visit: Payer: Self-pay | Admitting: Pharmacist

## 2017-07-14 VITALS — BP 134/85 | HR 94 | Temp 96.4°F | Resp 14 | Wt 143.2 lb

## 2017-07-14 DIAGNOSIS — E785 Hyperlipidemia, unspecified: Secondary | ICD-10-CM

## 2017-07-14 DIAGNOSIS — F17210 Nicotine dependence, cigarettes, uncomplicated: Secondary | ICD-10-CM | POA: Diagnosis not present

## 2017-07-14 DIAGNOSIS — C50412 Malignant neoplasm of upper-outer quadrant of left female breast: Secondary | ICD-10-CM

## 2017-07-14 DIAGNOSIS — Z9221 Personal history of antineoplastic chemotherapy: Secondary | ICD-10-CM | POA: Insufficient documentation

## 2017-07-14 DIAGNOSIS — Z79899 Other long term (current) drug therapy: Secondary | ICD-10-CM | POA: Insufficient documentation

## 2017-07-14 DIAGNOSIS — C7931 Secondary malignant neoplasm of brain: Secondary | ICD-10-CM | POA: Diagnosis not present

## 2017-07-14 DIAGNOSIS — I252 Old myocardial infarction: Secondary | ICD-10-CM | POA: Insufficient documentation

## 2017-07-14 DIAGNOSIS — T386X5S Adverse effect of antigonadotrophins, antiestrogens, antiandrogens, not elsewhere classified, sequela: Secondary | ICD-10-CM

## 2017-07-14 DIAGNOSIS — I10 Essential (primary) hypertension: Secondary | ICD-10-CM

## 2017-07-14 DIAGNOSIS — D702 Other drug-induced agranulocytosis: Secondary | ICD-10-CM | POA: Insufficient documentation

## 2017-07-14 DIAGNOSIS — Z170 Estrogen receptor positive status [ER+]: Secondary | ICD-10-CM | POA: Diagnosis not present

## 2017-07-14 DIAGNOSIS — K769 Liver disease, unspecified: Secondary | ICD-10-CM | POA: Insufficient documentation

## 2017-07-14 DIAGNOSIS — Z803 Family history of malignant neoplasm of breast: Secondary | ICD-10-CM

## 2017-07-14 DIAGNOSIS — Z86711 Personal history of pulmonary embolism: Secondary | ICD-10-CM | POA: Diagnosis not present

## 2017-07-14 DIAGNOSIS — Z79818 Long term (current) use of other agents affecting estrogen receptors and estrogen levels: Secondary | ICD-10-CM

## 2017-07-14 DIAGNOSIS — M8580 Other specified disorders of bone density and structure, unspecified site: Secondary | ICD-10-CM | POA: Diagnosis not present

## 2017-07-14 DIAGNOSIS — Z923 Personal history of irradiation: Secondary | ICD-10-CM | POA: Diagnosis not present

## 2017-07-14 DIAGNOSIS — E860 Dehydration: Secondary | ICD-10-CM

## 2017-07-14 LAB — COMPREHENSIVE METABOLIC PANEL
ALBUMIN: 3.6 g/dL (ref 3.5–5.0)
ALK PHOS: 144 U/L — AB (ref 38–126)
ALT: 19 U/L (ref 14–54)
AST: 34 U/L (ref 15–41)
Anion gap: 7 (ref 5–15)
BUN: 15 mg/dL (ref 6–20)
CALCIUM: 9.6 mg/dL (ref 8.9–10.3)
CHLORIDE: 101 mmol/L (ref 101–111)
CO2: 30 mmol/L (ref 22–32)
CREATININE: 0.76 mg/dL (ref 0.44–1.00)
GFR calc Af Amer: >60 mL/min (ref >60)
GFR calc non Af Amer: >60 mL/min (ref >60)
GLUCOSE: 78 mg/dL (ref 65–99)
Potassium: 4.5 mmol/L (ref 3.5–5.1)
SODIUM: 138 mmol/L (ref 135–145)
Total Bilirubin: 0.9 mg/dL (ref 0.3–1.2)
Total Protein: 8.2 g/dL — ABNORMAL HIGH (ref 6.5–8.1)

## 2017-07-14 LAB — CBC WITH DIFFERENTIAL/PLATELET
BASOS ABS: 0 10*3/uL (ref 0–0.1)
Basophils Relative: 1 %
EOS ABS: 0.1 10*3/uL (ref 0–0.7)
EOS PCT: 4 %
HCT: 37.4 % (ref 35.0–47.0)
HEMOGLOBIN: 12.3 g/dL (ref 12.0–16.0)
LYMPHS ABS: 0.6 10*3/uL — AB (ref 1.0–3.6)
Lymphocytes Relative: 40 %
MCH: 31.1 pg (ref 26.0–34.0)
MCHC: 33 g/dL (ref 32.0–36.0)
MCV: 94.2 fL (ref 80.0–100.0)
Monocytes Absolute: 0.1 10*3/uL — ABNORMAL LOW (ref 0.2–0.9)
Monocytes Relative: 9 %
NEUTROS PCT: 46 %
Neutro Abs: 0.7 10*3/uL — ABNORMAL LOW (ref 1.4–6.5)
PLATELETS: 133 10*3/uL — AB (ref 150–440)
RBC: 3.97 MIL/uL (ref 3.80–5.20)
RDW: 21.9 % — ABNORMAL HIGH (ref 11.5–14.5)
WBC: 1.6 10*3/uL — AB (ref 3.6–11.0)

## 2017-07-14 MED ORDER — ABEMACICLIB 50 MG PO TABS: ORAL_TABLET | ORAL | 3 refills | Status: DC

## 2017-07-14 MED ORDER — FULVESTRANT 250 MG/5ML IM SOLN
500.0000 mg | Freq: Once | INTRAMUSCULAR | Status: AC
  Administered 2017-07-14: 500 mg via INTRAMUSCULAR
  Filled 2017-07-14: qty 10

## 2017-07-14 NOTE — Assessment & Plan Note (Addendum)
#  RECURRENT Metastatic breast cancer- ER/PR positive HER-2/neu negative [status post liver biopsy February 2018; previously HER-2/neu positive]. Currently on palliative Faslodex + Abemaciclib  at '50mg'$ /day. Tolerating well except for continued neutropenia.  No evidence of clinical progression.  # Clinically improving; AUG 6th 2018- CT scan shows improvement of the liver lesions; tumor markers improving. Today white count is  1.7 ANC 700 Hb-11; platelets-133. We will get a scan in December 2018. We will order scan at next visit.   # Continue Abemaciclib at '50mg'$ /day.  Neutropenia ANC 900; however asymptomatic. Continue Abema '50mg'$  but change to 3 weeks-ON and 1 week OFF. New script given. Discussed with [place].   # Brain mets- s/p RT [finished Feb 28th]; symptomatic improvement. Sep 25th MRI- significant improvement; okay to drive. Reviewed the MRI with the patient.  # PN G-1-2/ improving secondary to chemotherapy. Continue Lyrica; Continue oxycodone once a day. Patient continues to be on physical therapy.  # faslodex today; follow up in 4 weeks/labs/faslodex again. New script for abema given.

## 2017-07-14 NOTE — Progress Notes (Signed)
Patient is here today for a follow up. Patient states no new concerns today.  

## 2017-07-14 NOTE — Progress Notes (Signed)
[place] OFFICE PROGRESS NOTE  Patient Care Team: [name], MD as PCP - General (Family Medicine) Bary Castilla, Forest Gleason, MD (General Surgery) [name], MD (Inactive) as Attending Physician (Internal Medicine)  No matching staging information was found for the patient.   Oncology History   # LEFT BREAST IDC; STAGE II [cT2N1] ER >90%; PR- 50-90%; her 2 NEU- POS; s/p Neoadj chemo; AUG 2016- TCH+P s/p Lumpec & partial ALND- path CR; s/p RT [finished Nov 2016];  adj Herceptin; HELD for Jan 19th 2017 [in DEC 2016-EF dropped from 63 to 51%; FEB 27th EF-42.9%]; JAN 2016 START Arimidex; May 2017- EF- 60%; May 24th 2017-Re-start Herceptin q 3W; July 28th STOP herceptin [finished 29m/m2; sec to Low EF; however 2017 Oct EF= 67%; improved]  # DEC 4th 2017- Start Neratinib 4 pills; DEC 11th 3 pills; STOPPED.  # FEB 2018- RECURRENCE ER/PR positive; Her 2 NEGATIVE [liver Bx]  # MARCH 1st 2018- Tax-Cytoxan [poor tol]  # FEB 2018- Brain mets [SBRT; Dr.Crystal; finished Feb 28th 2018]  # March 2018- Eribulin- poor tolerance  # June 8th 2018- Started Faslodex + Abema [abema-multiple interruptions sec to neutropenia]  # PN- G-2; may 2017- Cymbalta 659md;MARCH 2018-  acute vascular insuff of BIL LE [? Taxotere vasoconstriction on asprin]; # hemorragic shock LGIB [s/p colo; Dr.Wohl]    # April 2016- Liver Bx- NEG;   # Drop in EF from Herceptin [recovered OCt 27th- EF 65%]  # BMD- jan 2017- osteopenia  -----------------------------------------------------------     MOPine Levelne- Sep 4th 2018- PDL-1 0%/pending.       Carcinoma of upper-outer quadrant of left breast in female, estrogen receptor positive ([place]    INTERVAL HISTORY:  A very pleasant [age]ear old female patient with above history of Recurrent stage IV ER/PR positive HER-2/neu NEGATIVE [status post liver biopsy February 2018] - is currently on Faslodex-Abemaciclib [ambema  At  5068may] is here for follow-up.  Patient denies any fevers or chills. Denies any nausea vomiting diarrhea. No headaches. She is currently done with physical therapy. She is getting more active each day.  No falls. Patient has not had any recent hospitalizations. No fevers or chills. Appetite improving. Weight gain. No abdominal distention or swelling in the legs.  REVIEW OF SYSTEMS:  A complete 10 point review of system is done which is negative except mentioned above/history of present illness.   PAST MEDICAL HISTORY :  Past Medical History:  Diagnosis Date  . Chemotherapy-induced peripheral neuropathy ([place] . Epistaxis    a. 11/2016 in setting of asa/plavix-->silver nitrate cauterization.  . GI bleed    a. 11/2016 Admission w/ GIB and hypovolemic shock req 3u PRBC's;  b. 11/2016 ECG: gastritis & nonbleeding peptic ulcer; c. 11/2016 Conlonoscopy: rectal and sigmoid colonic ulcers.  . Heart attack ([facility]  a. 1998 Cath @ [facility]: reportedly no intervention required.  . HMarland Kitchenrceptin-induced cardiomyopathy (HCC[place]  a. In the setting of Herceptin Rx for breast cancer (initiated 12/2014); b. 03/2015 MUGA EF 64%; b. 08/2015 MUGA: EF 51%; c. 10/2015 MUGA: EF 44%; d. 11/2015 Echo: EF 45-50%; e. 01/2016 MUGA: EF 60%; f. 06/2016 MUGA EF 65%; g. 10/2016 MUGA: EF 61%;  h. 12/2016 Echo: EF 55-60%, gr1 DD.  . HMarland Kitchenperlipidemia   . Hypertension   . Neuropathy   . Possible PAD (peripheral artery disease) ([place]CC[place]  a. 11/2016 LE cyanosis and weak pulses-->CTA w/o significant Ao-BiFem dzs. ? distal dzs-->ASA/Plavix initiated  by vascular surgery.  Marland Kitchen PSVT (paroxysmal supraventricular tachycardia) ([place])    a. Dx 11/2016.  . Pulmonary embolism ([place])    a. 12/2016 CTA Chest: small nonocclusive PE in inferior segment of the Left lingula, somewhat eccentric filling defect suggesting chronic rather than acute embolic event; b. 02/6062 LE U/S:  No DVT; c. 12/2016 Echo: Nl RV fxn, nl PASP.  Marland Kitchen Recurrent Metastatic breast cancer ([place])     a. Dx 2016: Stage II, ER positive, PR positive, HER-2/neu overexpressing of the left breast-->chemo/radiation; b. 10/2016 CT Abd/pelvis: diffuse liver mets, ill defined sclerotic bone lesions-T12;  c. 10/2016 MRI brain: metastatic lesion along L temporal lobe (19x89m) w/ extensive surrounding edema & 575mmidline shift to right.  . Sinus tachycardia     PAST SURGICAL HISTORY :   Past Surgical History:  Procedure Laterality Date  . BREAST BIOPSY Left 2016   Positive  . BREAST LUMPECTOMY WITH SENTINEL LYMPH NODE BIOPSY Left 05/23/2015   Procedure: LEFT BREAST WIDE EXCISION WITH AXILLARY DISSECTION, MASTOPLASTY ;  Surgeon: JeRobert BellowMD;  Location: [facility]S;  Service: General;  Laterality: Left;  . BREAST SURGERY Left 12/18/14   breast biopsy/INVASIVE DUCTAL CARCINOMA OF BREAST, NOTTINGHAM GRADE 2.  . Marland KitchenREAST SURGERY  05/23/2015.   Wide excision/mastoplasty, axillary dissection. No residual invasive cancer, positive for residual DCIS. 0/2 nodes identified on axillary dissection. (no SLN by technetium or methylene blue)  . CARDIAC CATHETERIZATION    . COLONOSCOPY WITH PROPOFOL N/A 12/10/2016   Procedure: COLONOSCOPY WITH PROPOFOL;  Surgeon: DaLucilla LameMD;  Location: [facility];  Service: Endoscopy;  Laterality: N/A;  . COLONOSCOPY WITH PROPOFOL N/A 02/13/2017   Procedure: COLONOSCOPY WITH PROPOFOL;  Surgeon: WoLucilla LameMD;  [facility];  Service: Endoscopy;  Laterality: N/A;  . ESOPHAGOGASTRODUODENOSCOPY (EGD) WITH PROPOFOL N/A 12/08/2016   Procedure: ESOPHAGOGASTRODUODENOSCOPY (EGD) WITH PROPOFOL;  Surgeon: DaLucilla LameMD;  Location: [facility];  Service: Endoscopy;  Laterality: N/A;  . IVC FILTER INSERTION N/A 02/15/2017   Procedure: IVC Filter Insertion;  Surgeon: DeAlgernon HuxleyMD;  [facility];  Service: Cardiovascular;  Laterality: N/A;  . PORTACATH PLACEMENT Right 12-31-14   Dr ByBary Castilla  FAMILY HISTORY :   Family History  Problem Relation  Age of Onset  . Breast cancer Maternal Aunt   . Breast cancer Cousin   . Brain cancer Maternal Uncle   . Diabetes Mother   . Hypertension Mother   . Stroke Mother     SOCIAL HISTORY:   Social History  Substance Use Topics  . Smoking status: Light Tobacco Smoker    Packs/day: 0.50    Years: 18.00    Types: Cigarettes  . Smokeless tobacco: Never Used  . Alcohol use No    ALLERGIES:  is allergic to no known allergies.  MEDICATIONS:  Current Outpatient Prescriptions  Medication Sig Dispense Refill  . diphenoxylate-atropine (LOMOTIL) 2.5-0.025 MG tablet Take 1 tablet by mouth 4 (four) times daily as needed for diarrhea or loose stools. Take it along with immodium 60 tablet 0  . feeding supplement, ENSURE ENLIVE, (ENSURE ENLIVE) LIQD Take 237 mLs by mouth 2 (two) times daily between meals. 237 mL 12  . fluticasone (FLONASE) 50 MCG/ACT nasal spray Place 2 sprays into both nostrils daily. 16 g 0  . magnesium oxide (MAG-OX) 400 MG tablet Take 1 tablet (400 mg total) by mouth 2 (two) times daily. 60 tablet 6  . oxyCODONE (OXY IR/ROXICODONE) 5 MG immediate release  tablet Take 1 tablet (5 mg total) by mouth every 8 (eight) hours as needed for severe pain. 42 tablet 0  . potassium chloride SA (K-DUR,KLOR-CON) 20 MEQ tablet Take 1 tablet (20 mEq total) by mouth daily. 90 tablet 3  . pregabalin (LYRICA) 75 MG capsule Take 1 capsule (75 mg total) by mouth 2 (two) times daily. 60 capsule 3  . abemaciclib (VERZENIO) 50 MG tablet Take 1 tablet (50mg) by mouth daily for 21 days, then off for 7 days. 21 tablet 3  . anastrozole (ARIMIDEX) 1 MG tablet Take 1 tablet (1 mg total) by mouth daily. (Patient not taking: Reported on 07/14/2017) 90 tablet 3  . ondansetron (ZOFRAN) 8 MG tablet Take 1 tablet (8 mg total) by mouth every 8 (eight) hours as needed for nausea or vomiting (start 3 days; after chemo). (Patient not taking: Reported on 07/14/2017) 40 tablet 1   No current facility-administered  medications for this visit.    Facility-Administered Medications Ordered in Other Visits  Medication Dose Route Frequency Provider Last Rate Last Dose  . 0.9 %  sodium chloride infusion   Intravenous Continuous Pandit, Sandeep, MD      . 0.9 %  sodium chloride infusion   Intravenous Continuous Finnegan, Timothy J, MD 999 mL/hr at 04/24/15 1520    . heparin lock flush 100 unit/mL  500 Units Intravenous Once Berenzon, Dmitriy, MD      . sodium chloride 0.9 % injection 10 mL  10 mL Intravenous PRN Pandit, Sandeep, MD   10 mL at 04/04/15 1440  . sodium chloride flush (NS) 0.9 % injection 10 mL  10 mL Intravenous PRN Berenzon, Dmitriy, MD      . Tbo-Filgrastim (GRANIX) injection 480 mcg  480 mcg Subcutaneous Once ,  R, MD        PHYSICAL EXAMINATION: ECOG PERFORMANCE STATUS: 0 - Asymptomatic  BP 134/85 (BP Location: Left Arm, Patient Position: Sitting)   Pulse 94   Temp (!) 96.4 F (35.8 C) (Tympanic)   Resp 14   Wt 143 lb 3.2 oz (65 kg)   BMI 23.11 kg/m   Filed Weights   07/14/17 1118  Weight: 143 lb 3.2 oz (65 kg)    GENERAL: Well-nourished well-developed; Alert, no distress and comfortable. Accompanied by her brother.  She is walking with a cane. EYES: no pallor. No jaundice. She is in a wheelchair  OROPHARYNX: No  ulceration; good dentition ; NECK: supple, no masses felt LYMPH:  no palpable lymphadenopathy in the cervical, axillary or inguinal regions LUNGS: clear to auscultation and  No wheeze or crackles HEART/CVS: regular rate & rhythm and no murmurs; NO bilateral lower extremity edema.  ABDOMEN:abdomen soft, non-tender and normal bowel sounds Musculoskeletal:no cyanosis of digits and no clubbing  PSYCH: alert & oriented x 3 with fluent speech NEURO: no focal motor/sensory deficits- except for foot drop on the right side.   LABORATORY DATA:  I have reviewed the data as listed    Component Value Date/Time   NA 138 07/14/2017 1034   NA 135 03/04/2017  0918   NA 135 01/08/2015 0850   K 4.5 07/14/2017 1034   K 3.7 01/08/2015 0850   CL 101 07/14/2017 1034   CL 101 01/08/2015 0850   CO2 30 07/14/2017 1034   CO2 26 01/08/2015 0850   GLUCOSE 78 07/14/2017 1034   GLUCOSE 161 (H) 01/08/2015 0850   BUN 15 07/14/2017 1034   BUN 8 03/04/2017 0918   BUN 17 01/08/2015 0850     CREATININE 0.76 07/14/2017 1034   CREATININE 0.82 01/22/2015 1559   CALCIUM 9.6 07/14/2017 1034   CALCIUM 9.3 01/08/2015 0850   PROT 8.2 (H) 07/14/2017 1034   PROT 6.4 03/04/2017 0918   PROT 6.8 01/22/2015 1559   ALBUMIN 3.6 07/14/2017 1034   ALBUMIN 2.5 (L) 03/04/2017 0918   ALBUMIN 3.9 01/22/2015 1559   AST 34 07/14/2017 1034   AST 34 01/22/2015 1559   ALT 19 07/14/2017 1034   ALT 42 01/22/2015 1559   ALKPHOS 144 (H) 07/14/2017 1034   ALKPHOS 157 (H) 01/22/2015 1559   BILITOT 0.9 07/14/2017 1034   BILITOT 6.2 (H) 03/04/2017 0918   BILITOT 0.2 (L) 01/22/2015 1559   GFRNONAA >60 07/14/2017 1034   GFRNONAA >60 01/22/2015 1559   GFRAA >60 07/14/2017 1034   GFRAA >60 01/22/2015 1559    No results found for: SPEP, UPEP  Lab Results  Component Value Date   WBC 1.6 (L) 07/14/2017   NEUTROABS 0.7 (L) 07/14/2017   HGB 12.3 07/14/2017   HCT 37.4 07/14/2017   MCV 94.2 07/14/2017   PLT 133 (L) 07/14/2017      Chemistry      Component Value Date/Time   NA 138 07/14/2017 1034   NA 135 03/04/2017 0918   NA 135 01/08/2015 0850   K 4.5 07/14/2017 1034   K 3.7 01/08/2015 0850   CL 101 07/14/2017 1034   CL 101 01/08/2015 0850   CO2 30 07/14/2017 1034   CO2 26 01/08/2015 0850   BUN 15 07/14/2017 1034   BUN 8 03/04/2017 0918   BUN 17 01/08/2015 0850   CREATININE 0.76 07/14/2017 1034   CREATININE 0.82 01/22/2015 1559      Component Value Date/Time   CALCIUM 9.6 07/14/2017 1034   CALCIUM 9.3 01/08/2015 0850   ALKPHOS 144 (H) 07/14/2017 1034   ALKPHOS 157 (H) 01/22/2015 1559   AST 34 07/14/2017 1034   AST 34 01/22/2015 1559   ALT 19 07/14/2017 1034    ALT 42 01/22/2015 1559   BILITOT 0.9 07/14/2017 1034   BILITOT 6.2 (H) 03/04/2017 0918   BILITOT 0.2 (L) 01/22/2015 1559            LABORATORY DATA:  I have reviewed the data as listed    Component Value Date/Time   NA 138 07/14/2017 1034   NA 135 03/04/2017 0918   NA 135 01/08/2015 0850   K 4.5 07/14/2017 1034   K 3.7 01/08/2015 0850   CL 101 07/14/2017 1034   CL 101 01/08/2015 0850   CO2 30 07/14/2017 1034   CO2 26 01/08/2015 0850   GLUCOSE 78 07/14/2017 1034   GLUCOSE 161 (H) 01/08/2015 0850   BUN 15 07/14/2017 1034   BUN 8 03/04/2017 0918   BUN 17 01/08/2015 0850   CREATININE 0.76 07/14/2017 1034   CREATININE 0.82 01/22/2015 1559   CALCIUM 9.6 07/14/2017 1034   CALCIUM 9.3 01/08/2015 0850   PROT 8.2 (H) 07/14/2017 1034   PROT 6.4 03/04/2017 0918   PROT 6.8 01/22/2015 1559   ALBUMIN 3.6 07/14/2017 1034   ALBUMIN 2.5 (L) 03/04/2017 0918   ALBUMIN 3.9 01/22/2015 1559   AST 34 07/14/2017 1034   AST 34 01/22/2015 1559   ALT 19 07/14/2017 1034   ALT 42 01/22/2015 1559   ALKPHOS 144 (H) 07/14/2017 1034   ALKPHOS 157 (H) 01/22/2015 1559   BILITOT 0.9 07/14/2017 1034   BILITOT 6.2 (H) 03/04/2017 0918   BILITOT 0.2 (L) 01/22/2015 1559     GFRNONAA >60 07/14/2017 1034   GFRNONAA >60 01/22/2015 1559   GFRAA >60 07/14/2017 1034   GFRAA >60 01/22/2015 1559    No results found for: SPEP, UPEP  Lab Results  Component Value Date   WBC 1.6 (L) 07/14/2017   NEUTROABS 0.7 (L) 07/14/2017   HGB 12.3 07/14/2017   HCT 37.4 07/14/2017   MCV 94.2 07/14/2017   PLT 133 (L) 07/14/2017      Chemistry      Component Value Date/Time   NA 138 07/14/2017 1034   NA 135 03/04/2017 0918   NA 135 01/08/2015 0850   K 4.5 07/14/2017 1034   K 3.7 01/08/2015 0850   CL 101 07/14/2017 1034   CL 101 01/08/2015 0850   CO2 30 07/14/2017 1034   CO2 26 01/08/2015 0850   BUN 15 07/14/2017 1034   BUN 8 03/04/2017 0918   BUN 17 01/08/2015 0850   CREATININE 0.76 07/14/2017 1034    CREATININE 0.82 01/22/2015 1559      Component Value Date/Time   CALCIUM 9.6 07/14/2017 1034   CALCIUM 9.3 01/08/2015 0850   ALKPHOS 144 (H) 07/14/2017 1034   ALKPHOS 157 (H) 01/22/2015 1559   AST 34 07/14/2017 1034   AST 34 01/22/2015 1559   ALT 19 07/14/2017 1034   ALT 42 01/22/2015 1559   BILITOT 0.9 07/14/2017 1034   BILITOT 6.2 (H) 03/04/2017 0918   BILITOT 0.2 (L) 01/22/2015 1559     IMPRESSION: Normal left ventricular wall motion with estimated ejection fraction of 65%.   Electronically Signed   By: Marijo Sanes M.D.   On: 07/27/2016 09:04  RADIOGRAPHIC STUDIES: I have personally reviewed the radiological images as listed and agreed with the findings in the report. No results found.   ASSESSMENT & PLAN:  Carcinoma of upper-outer quadrant of left breast in female, estrogen receptor positive ([place]) # RECURRENT Metastatic breast cancer- ER/PR positive HER-2/neu negative [status post liver biopsy February 2018; previously HER-2/neu positive]. Currently on palliative Faslodex + Abemaciclib  at 62m/day. Tolerating well except for continued neutropenia.  No evidence of clinical progression.  # Clinically improving; AUG 6th 2018- CT scan shows improvement of the liver lesions; tumor markers improving. Today white count is  1.7 ANC 700 Hb-11; platelets-133. We will get a scan in December 2018. We will order scan at next visit.   # Continue Abemaciclib at 580mday.  Neutropenia ANC 900; however asymptomatic. Continue Abema [id number] change to 3 weeks-ON and 1 week OFF. New script given. Discussed with Allyson.   # Brain mets- s/p RT [finished Feb 28th]; symptomatic improvement. Sep 25th MRI- significant improvement; okay to drive. Reviewed the MRI with the patient.  # PN G-1-2/ improving secondary to chemotherapy. Continue Lyrica; Continue oxycodone once a day. Patient continues to be on physical therapy.  # faslodex today; follow up in 4 weeks/labs/faslodex again. New script  for abema given.   Orders Placed This Encounter  Procedures  . CBC with Differential/Platelet    Standing Status:   Future    Standing Expiration Date:   07/14/2018  . Comprehensive metabolic panel    Standing Status:   Future    Standing Expiration Date:   07/14/2018  . Cancer antigen 27.29    Standing Status:   Future    Standing Expiration Date:   07/14/2018   All questions were answered. The patient knows to call the clinic with any problems, questions or concerns.      GovCammie Sickle  MD 07/14/2017 1:27 PM 

## 2017-07-15 ENCOUNTER — Encounter: Payer: BLUE CROSS/BLUE SHIELD | Admitting: Occupational Therapy

## 2017-07-15 ENCOUNTER — Ambulatory Visit (INDEPENDENT_AMBULATORY_CARE_PROVIDER_SITE_OTHER): Payer: BLUE CROSS/BLUE SHIELD | Admitting: Cardiovascular Disease

## 2017-07-15 ENCOUNTER — Telehealth: Payer: Self-pay | Admitting: Pharmacist

## 2017-07-15 ENCOUNTER — Encounter: Payer: Self-pay | Admitting: Cardiovascular Disease

## 2017-07-15 VITALS — BP 118/60 | HR 97 | Ht 66.0 in | Wt 143.8 lb

## 2017-07-15 DIAGNOSIS — I427 Cardiomyopathy due to drug and external agent: Secondary | ICD-10-CM | POA: Diagnosis not present

## 2017-07-15 DIAGNOSIS — T451X5A Adverse effect of antineoplastic and immunosuppressive drugs, initial encounter: Secondary | ICD-10-CM

## 2017-07-15 DIAGNOSIS — I10 Essential (primary) hypertension: Secondary | ICD-10-CM | POA: Diagnosis not present

## 2017-07-15 DIAGNOSIS — I471 Supraventricular tachycardia: Secondary | ICD-10-CM | POA: Diagnosis not present

## 2017-07-15 LAB — CANCER ANTIGEN 27.29: CA 27.29: 17.4 U/mL (ref 0.0–38.6)

## 2017-07-15 NOTE — Telephone Encounter (Signed)
Oral Chemotherapy Pharmacist Encounter  Received notification that me patient had a question about her oral chemo medication Verzenio.   Called [facility] to check on status, they are still processing the prescription.  Called [name] to see what questions she had. She stated she stop by her local [facility] and they did not have her prescription. I reminded [name] that her medication is being filled by [facility] the [facility] for [place]. Provided her with their phone number. Reviewed new medication instructions with [name], she will take her Verzenio for 21days on and 7 days off. Her last Verzenio tablet was today. She will restart her Verzenio on Friday 10/26. Hopefully [facility] will have her medication to her by then.  She know to call with any questions.  Thank you,  Sarah Horne, PharmD, BCPS Hematology/Oncology Clinical Pharmacist [facility] [phone]  07/15/2017 3:19 PM

## 2017-07-15 NOTE — Patient Instructions (Signed)
Medication Instructions:  Your physician has recommended you make the following change in your medication:  STOP taking flecainide   Labwork: none  Testing/Procedures: None.  Follow-Up: Your physician recommends that you schedule a follow-up appointment in: 4 months with Dr. Fletcher Anon    Any Other Special Instructions Will Be Listed Below (If Applicable).     If you need a refill on your cardiac medications before your next appointment, please call your pharmacy.

## 2017-07-15 NOTE — Progress Notes (Signed)
Cardiology Office Note   Date:  07/15/2017   ID:  Sarah Horne, DOB 1956-02-28, MRN [id number]  PCP:  [name], MD  Cardiologist:   Kathlyn Sacramento, MD  Oncologist: Dr.Brahmanday  Chief Complaint  Patient presents with  . other    3 month follow up. Patient denies chest paina dn SOB. Meds reviewed evrbally with patient.       History of Present Illness: Sarah Horne is a 61 y.o. female who Is here today for a follow-up visit regarding Herceptin-induced cardiomyopathy. The patient reports possible prior myocardial infarction in 1998 where she underwent cardiac catheterization at [facility]. She did not require revascularization and she was treated medically. The details of that admission are not available. She has chronic medical conditions that include hypertension, hyperlipidemia and tobacco use . She  As known history of breast cancer with brain metastases diagnosed this year.she had multiple hospitalizations related to complications of breast cancer and its treatment.she was noted to have recurrent episodes of supraventricular tachycardia in the setting of acute medical abnormalities.this has been treated with flecainide. The patient had pulmonary embolism and had an IVC filter placed.she also had recurrent ascites.  She has been doing better overall from a cancer standpoint. He has been taking flecainide 50 mg twice daily and has no recurrent tachycardia. She denies any chest pain or shortness of breath.  Past Medical History:  Diagnosis Date  . Chemotherapy-induced peripheral neuropathy ([place])   . Epistaxis    a. 11/2016 in setting of asa/plavix-->silver nitrate cauterization.  . GI bleed    a. 11/2016 Admission w/ GIB and hypovolemic shock req 3u PRBC's;  b. 11/2016 ECG: gastritis & nonbleeding peptic ulcer; c. 11/2016 Conlonoscopy: rectal and sigmoid colonic ulcers.  . Heart attack ([place])    a. 1998 Cath @ [facility]: reportedly no intervention required.  Marland Kitchen Herceptin-induced  cardiomyopathy ([place])    a. In the setting of Herceptin Rx for breast cancer (initiated 12/2014); b. 03/2015 MUGA EF 64%; b. 08/2015 MUGA: EF 51%; c. 10/2015 MUGA: EF 44%; d. 11/2015 Echo: EF 45-50%; e. 01/2016 MUGA: EF 60%; f. 06/2016 MUGA EF 65%; g. 10/2016 MUGA: EF 61%;  h. 12/2016 Echo: EF 55-60%, gr1 DD.  Marland Kitchen Hyperlipidemia   . Hypertension   . Neuropathy   . Possible PAD (peripheral artery disease) ([place])    a. 11/2016 LE cyanosis and weak pulses-->CTA w/o significant Ao-BiFem dzs. ? distal dzs-->ASA/Plavix initiated by vascular surgery.  Marland Kitchen PSVT (paroxysmal supraventricular tachycardia) ([place])    a. Dx 11/2016.  . Pulmonary embolism ([place])    a. 12/2016 CTA Chest: small nonocclusive PE in inferior segment of the Left lingula, somewhat eccentric filling defect suggesting chronic rather than acute embolic event; b. 12/345 LE U/S:  No DVT; c. 12/2016 Echo: Nl RV fxn, nl PASP.  Marland Kitchen Recurrent Metastatic breast cancer ([place])    a. Dx 2016: Stage II, ER positive, PR positive, HER-2/neu overexpressing of the left breast-->chemo/radiation; b. 10/2016 CT Abd/pelvis: diffuse liver mets, ill defined sclerotic bone lesions-T12;  c. 10/2016 MRI brain: metastatic lesion along L temporal lobe (19x48m) w/ extensive surrounding edema & 567mmidline shift to right.  . Sinus tachycardia     Past Surgical History:  Procedure Laterality Date  . BREAST BIOPSY Left 2016   Positive  . BREAST LUMPECTOMY WITH SENTINEL LYMPH NODE BIOPSY Left 05/23/2015   Procedure: LEFT BREAST WIDE EXCISION WITH AXILLARY DISSECTION, MASTOPLASTY ;  Surgeon: JeRobert BellowMD;  [facility];  Service: General;  Laterality: Left;  . BREAST SURGERY Left 12/18/14   breast biopsy/INVASIVE DUCTAL CARCINOMA OF BREAST, NOTTINGHAM GRADE 2.  Marland Kitchen BREAST SURGERY  05/23/2015.   Wide excision/mastoplasty, axillary dissection. No residual invasive cancer, positive for residual DCIS. 0/2 nodes identified on axillary dissection. (no SLN by technetium or methylene  blue)  . CARDIAC CATHETERIZATION    . COLONOSCOPY WITH PROPOFOL N/A 12/10/2016   Procedure: COLONOSCOPY WITH PROPOFOL;  Surgeon: Lucilla Lame, MD;  Location: [facility] ENDOSCOPY;  Service: Endoscopy;  Laterality: N/A;  . COLONOSCOPY WITH PROPOFOL N/A 02/13/2017   Procedure: COLONOSCOPY WITH PROPOFOL;  Surgeon: Lucilla Lame, MD;  Location: [facility] ENDOSCOPY;  Service: Endoscopy;  Laterality: N/A;  . ESOPHAGOGASTRODUODENOSCOPY (EGD) WITH PROPOFOL N/A 12/08/2016   Procedure: ESOPHAGOGASTRODUODENOSCOPY (EGD) WITH PROPOFOL;  Surgeon: Lucilla Lame, MD;  Location: [facility] ENDOSCOPY;  Service: Endoscopy;  Laterality: N/A;  . IVC FILTER INSERTION N/A 02/15/2017   Procedure: IVC Filter Insertion;  Surgeon: Algernon Huxley, MD;  Location: [place] CV LAB;  Service: Cardiovascular;  Laterality: N/A;  . PORTACATH PLACEMENT Right 12-31-14   Dr Bary Castilla     Current Outpatient Prescriptions  Medication Sig Dispense Refill  . abemaciclib (VERZENIO) 50 MG tablet Take 1 tablet (5m) by mouth daily for 21 days, then off for 7 days. 21 tablet 3  . anastrozole (ARIMIDEX) 1 MG tablet Take 1 tablet (1 mg total) by mouth daily. 90 tablet 3  . diphenoxylate-atropine (LOMOTIL) 2.5-0.025 MG tablet Take 1 tablet by mouth 4 (four) times daily as needed for diarrhea or loose stools. Take it along with immodium 60 tablet 0  . feeding supplement, ENSURE ENLIVE, (ENSURE ENLIVE) LIQD Take 237 mLs by mouth 2 (two) times daily between meals. 237 mL 12  . fluticasone (FLONASE) 50 MCG/ACT nasal spray Place 2 sprays into both nostrils daily. 16 g 0  . magnesium oxide (MAG-OX) 400 MG tablet Take 1 tablet (400 mg total) by mouth 2 (two) times daily. 60 tablet 6  . ondansetron (ZOFRAN) 8 MG tablet Take 1 tablet (8 mg total) by mouth every 8 (eight) hours as needed for nausea or vomiting (start 3 days; after chemo). 40 tablet 1  . oxyCODONE (OXY IR/ROXICODONE) 5 MG immediate release tablet Take 1 tablet (5 mg total) by mouth every 8 (eight) hours as  needed for severe pain. 42 tablet 0  . potassium chloride SA (K-DUR,KLOR-CON) 20 MEQ tablet Take 1 tablet (20 mEq total) by mouth daily. 90 tablet 3  . pregabalin (LYRICA) 75 MG capsule Take 1 capsule (75 mg total) by mouth 2 (two) times daily. 60 capsule 3   No current facility-administered medications for this visit.    Facility-Administered Medications Ordered in Other Visits  Medication Dose Route Frequency Provider Last Rate Last Dose  . 0.9 %  sodium chloride infusion   Intravenous Continuous Pandit, Sandeep, MD      . 0.9 %  sodium chloride infusion   Intravenous Continuous FLloyd Huger MD 999 mL/hr at 04/24/15 1520    . heparin lock flush 100 unit/mL  500 Units Intravenous Once Berenzon, Dmitriy, MD      . sodium chloride 0.9 % injection 10 mL  10 mL Intravenous PRN PLeia Alf MD   10 mL at 04/04/15 1440  . sodium chloride flush (NS) 0.9 % injection 10 mL  10 mL Intravenous PRN Berenzon, Dmitriy, MD      . Tbo-Filgrastim ([facility] injection 480 mcg  480 mcg Subcutaneous Once BCeloron Govinda R,  MD        Allergies:   No known allergies    Social History:  The patient  reports that she has been smoking Cigarettes.  She has a 9.00 pack-year smoking history. She has never used smokeless tobacco. She reports that she does not drink alcohol or use drugs.   Family History:  The patient's family history includes Brain cancer in her maternal uncle; Breast cancer in her cousin and maternal aunt; Diabetes in her mother; Hypertension in her mother; Stroke in her mother.    ROS:  Please see the history of present illness.   Otherwise, review of systems are positive for none.   All other systems are reviewed and negative.    PHYSICAL EXAM: VS:  BP 118/60 (BP Location: Right Arm, Patient Position: Sitting, Cuff Size: Normal)   Pulse 97   Ht _0  (1.676 m)   Wt 143 lb 12 oz (65.2 kg)   BMI 23.20 kg/m  , BMI Body mass index is 23.2 kg/m. GEN: Well nourished, well  developed, in no acute distress  HEENT: normal  Neck: no JVD, carotid bruits, or masses Cardiac: RRR; no murmurs, rubs, or gallops,no edema  Respiratory:  clear to auscultation bilaterally, normal work of breathing GI: soft, nontender, nondistended, + BS MS: no deformity or atrophy  Skin: warm and dry, no rash Neuro:  Strength and sensation are intact Psych: euthymic mood, full affect   EKG:  EKG is  ordered today. EKG showed normal sinus rhythm with left atrial enlargement  Recent Labs: 01/12/2017: B Natriuretic Peptide 167.0; TSH 1.410 03/16/2017: Magnesium 1.7 07/14/2017: ALT 19; BUN 15; Creatinine, Ser 0.76; Hemoglobin 12.3; Platelets 133; Potassium 4.5; Sodium 138    Lipid Panel    Component Value Date/Time   CHOL 136 01/18/2017 0957   TRIG 127 01/18/2017 0957   HDL 19 (L) 01/18/2017 0957   CHOLHDL 7.2 (H) 01/18/2017 0957   VLDL 25 01/18/2017 0957   LDLCALC 92 01/18/2017 0957      Wt Readings from Last 3 Encounters:  07/15/17 143 lb 12 oz (65.2 kg)  07/14/17 143 lb 3.2 oz (65 kg)  06/16/17 137 lb 6.4 oz (62.3 kg)         ASSESSMENT AND PLAN:  1.  Paroxysmal supraventricular tachycardia: most of her episodes were in the setting of acute illness and given that she is more stable, I elected to hold off on flecainide. She is having financial difficulties getting her medications. If she develops recurrent tachycardia and NSVT, I recommend resuming flecainide at 50 mg twice daily.  2. Chemotherapy induced cardiomyopathy:   This was due to Herceptin with subsequent improvement in LV systolic function to normal.  3. Essential hypertension: she is no longer having episodes of hypotension and she is currently not on any antihypertensive medications.   Disposition:   FU with me in 4  month  Signed,  Kathlyn Sacramento, MD  07/15/2017 2:14 PM    [facility]

## 2017-07-21 ENCOUNTER — Telehealth: Payer: Self-pay | Admitting: *Deleted

## 2017-07-21 NOTE — Telephone Encounter (Addendum)
Error

## 2017-07-22 ENCOUNTER — Other Ambulatory Visit: Payer: Self-pay | Admitting: *Deleted

## 2017-07-22 MED ORDER — OXYCODONE HCL 5 MG PO TABS: 5.0000 mg | ORAL_TABLET | Freq: Three times a day (TID) | ORAL | 0 refills | Status: DC | PRN

## 2017-07-23 ENCOUNTER — Other Ambulatory Visit: Payer: Self-pay | Admitting: Internal Medicine

## 2017-07-23 DIAGNOSIS — Z170 Estrogen receptor positive status [ER+]: Principal | ICD-10-CM

## 2017-07-23 DIAGNOSIS — C50412 Malignant neoplasm of upper-outer quadrant of left female breast: Secondary | ICD-10-CM

## 2017-07-23 NOTE — Telephone Encounter (Signed)
Dx:  Carcinoma of upper-outer quadrant of ...   Ref Range & Units 9d ago  Potassium 3.5 - 5.1 mmol/L 4.5

## 2017-07-26 ENCOUNTER — Other Ambulatory Visit: Payer: Self-pay | Admitting: Cardiovascular Disease

## 2017-07-26 NOTE — Telephone Encounter (Addendum)
Oral Oncology Patient Advocate Encounter  Patients medication was shipped and signed for on 07/23/2017 per [facility].  Verzino Copay ID# [id number] Bin# [id number] Group # [id number] [phone] $25,000.00 calendar y ear   [place] Patient Advocate [phone] 07/26/2017 12:28 PM

## 2017-08-11 ENCOUNTER — Inpatient Hospital Stay (HOSPITAL_BASED_OUTPATIENT_CLINIC_OR_DEPARTMENT_OTHER): Payer: BLUE CROSS/BLUE SHIELD | Admitting: Internal Medicine

## 2017-08-11 ENCOUNTER — Inpatient Hospital Stay: Payer: BLUE CROSS/BLUE SHIELD | Attending: Internal Medicine

## 2017-08-11 ENCOUNTER — Inpatient Hospital Stay: Payer: BLUE CROSS/BLUE SHIELD

## 2017-08-11 VITALS — BP 110/79 | HR 89 | Temp 97.9°F | Resp 16 | Wt 150.2 lb

## 2017-08-11 DIAGNOSIS — C50412 Malignant neoplasm of upper-outer quadrant of left female breast: Secondary | ICD-10-CM | POA: Insufficient documentation

## 2017-08-11 DIAGNOSIS — C7931 Secondary malignant neoplasm of brain: Secondary | ICD-10-CM | POA: Insufficient documentation

## 2017-08-11 DIAGNOSIS — F17210 Nicotine dependence, cigarettes, uncomplicated: Secondary | ICD-10-CM | POA: Insufficient documentation

## 2017-08-11 DIAGNOSIS — M8580 Other specified disorders of bone density and structure, unspecified site: Secondary | ICD-10-CM | POA: Diagnosis not present

## 2017-08-11 DIAGNOSIS — E860 Dehydration: Secondary | ICD-10-CM

## 2017-08-11 DIAGNOSIS — D701 Agranulocytosis secondary to cancer chemotherapy: Secondary | ICD-10-CM | POA: Diagnosis not present

## 2017-08-11 DIAGNOSIS — Z8719 Personal history of other diseases of the digestive system: Secondary | ICD-10-CM | POA: Diagnosis not present

## 2017-08-11 DIAGNOSIS — Z86711 Personal history of pulmonary embolism: Secondary | ICD-10-CM

## 2017-08-11 DIAGNOSIS — I252 Old myocardial infarction: Secondary | ICD-10-CM | POA: Insufficient documentation

## 2017-08-11 DIAGNOSIS — M25519 Pain in unspecified shoulder: Secondary | ICD-10-CM

## 2017-08-11 DIAGNOSIS — Z923 Personal history of irradiation: Secondary | ICD-10-CM | POA: Insufficient documentation

## 2017-08-11 DIAGNOSIS — Z803 Family history of malignant neoplasm of breast: Secondary | ICD-10-CM

## 2017-08-11 DIAGNOSIS — Z79899 Other long term (current) drug therapy: Secondary | ICD-10-CM | POA: Diagnosis not present

## 2017-08-11 DIAGNOSIS — Z170 Estrogen receptor positive status [ER+]: Secondary | ICD-10-CM

## 2017-08-11 DIAGNOSIS — C787 Secondary malignant neoplasm of liver and intrahepatic bile duct: Secondary | ICD-10-CM | POA: Insufficient documentation

## 2017-08-11 DIAGNOSIS — E785 Hyperlipidemia, unspecified: Secondary | ICD-10-CM

## 2017-08-11 DIAGNOSIS — Z79818 Long term (current) use of other agents affecting estrogen receptors and estrogen levels: Secondary | ICD-10-CM

## 2017-08-11 DIAGNOSIS — I10 Essential (primary) hypertension: Secondary | ICD-10-CM

## 2017-08-11 DIAGNOSIS — Z9221 Personal history of antineoplastic chemotherapy: Secondary | ICD-10-CM | POA: Insufficient documentation

## 2017-08-11 DIAGNOSIS — T451X5S Adverse effect of antineoplastic and immunosuppressive drugs, sequela: Secondary | ICD-10-CM | POA: Diagnosis not present

## 2017-08-11 LAB — CBC WITH DIFFERENTIAL/PLATELET
Basophils Absolute: 0 10*3/uL (ref 0–0.1)
Basophils Relative: 1 %
Eosinophils Absolute: 0.1 10*3/uL (ref 0–0.7)
Eosinophils Relative: 5 %
HCT: 39 % (ref 35.0–47.0)
Hemoglobin: 13.1 g/dL (ref 12.0–16.0)
Lymphocytes Relative: 41 %
Lymphs Abs: 0.7 10*3/uL — ABNORMAL LOW (ref 1.0–3.6)
MCH: 31.5 pg (ref 26.0–34.0)
MCHC: 33.5 g/dL (ref 32.0–36.0)
MCV: 93.9 fL (ref 80.0–100.0)
Monocytes Absolute: 0.1 10*3/uL — ABNORMAL LOW (ref 0.2–0.9)
Monocytes Relative: 9 %
Neutro Abs: 0.7 10*3/uL — ABNORMAL LOW (ref 1.4–6.5)
Neutrophils Relative %: 44 %
Platelets: 144 10*3/uL — ABNORMAL LOW (ref 150–440)
RBC: 4.16 MIL/uL (ref 3.80–5.20)
RDW: 18 % — ABNORMAL HIGH (ref 11.5–14.5)
WBC: 1.6 10*3/uL — ABNORMAL LOW (ref 3.6–11.0)

## 2017-08-11 LAB — COMPREHENSIVE METABOLIC PANEL
ALT: 22 U/L (ref 14–54)
AST: 34 U/L (ref 15–41)
Albumin: 3.6 g/dL (ref 3.5–5.0)
Alkaline Phosphatase: 152 U/L — ABNORMAL HIGH (ref 38–126)
Anion gap: 6 (ref 5–15)
BUN: 17 mg/dL (ref 6–20)
CO2: 30 mmol/L (ref 22–32)
Calcium: 9.5 mg/dL (ref 8.9–10.3)
Chloride: 102 mmol/L (ref 101–111)
Creatinine, Ser: 0.72 mg/dL (ref 0.44–1.00)
GFR calc Af Amer: >60 mL/min (ref >60)
GFR calc non Af Amer: >60 mL/min (ref >60)
Glucose, Bld: 70 mg/dL (ref 65–99)
Potassium: 4.5 mmol/L (ref 3.5–5.1)
Sodium: 138 mmol/L (ref 135–145)
Total Bilirubin: 0.7 mg/dL (ref 0.3–1.2)
Total Protein: 8 g/dL (ref 6.5–8.1)

## 2017-08-11 MED ORDER — FULVESTRANT 250 MG/5ML IM SOLN
500.0000 mg | Freq: Once | INTRAMUSCULAR | Status: AC
  Administered 2017-08-11: 500 mg via INTRAMUSCULAR
  Filled 2017-08-11: qty 10

## 2017-08-11 MED ORDER — ERGOCALCIFEROL 1.25 MG (50000 UT) PO CAPS: 50000.0000 [IU] | ORAL_CAPSULE | ORAL | 1 refills | Status: DC

## 2017-08-11 NOTE — Progress Notes (Signed)
[facility] OFFICE PROGRESS NOTE  Patient Care Team: [name], MD as PCP - General (Family Medicine) Bary Castilla, Forest Gleason, MD (General Surgery) [name], MD (Inactive) as Attending Physician (Internal Medicine)  No matching staging information was found for the patient.   Oncology History   # LEFT BREAST IDC; STAGE II [cT2N1] ER >90%; PR- 50-90%; her 2 NEU- POS; s/p Neoadj chemo; AUG 2016- TCH+P s/p Lumpec & partial ALND- path CR; s/p RT [finished Nov 2016];  adj Herceptin; HELD for Jan 19th 2017 [in DEC 2016-EF dropped from 63 to 51%; FEB 27th EF-42.9%]; JAN 2016 START Arimidex; May 2017- EF- 60%; May 24th 2017-Re-start Herceptin q 3W; July 28th STOP herceptin [finished 69m/m2; sec to Low EF; however 2017 Oct EF= 67%; improved]  # DEC 4th 2017- Start Neratinib 4 pills; DEC 11th 3 pills; STOPPED.  # FEB 2018- RECURRENCE ER/PR positive; Her 2 NEGATIVE [liver Bx]  # MARCH 1st 2018- Tax-Cytoxan [poor tol]  # FEB 2018- Brain mets [SBRT; Dr.Crystal; finished Feb 28th 2018]  # March 2018- Eribulin- poor tolerance  # June 8th 2018- Started Faslodex + Abema [abema-multiple interruptions sec to neutropenia]  # PN- G-2; may 2017- Cymbalta 619md;MARCH 2018-  acute vascular insuff of BIL LE [? Taxotere vasoconstriction on asprin]; # hemorragic shock LGIB [s/p colo; Dr.Wohl]    # April 2016- Liver Bx- NEG;   # Drop in EF from Herceptin [recovered OCt 27th- EF 65%]  # BMD- jan 2017- osteopenia  -----------------------------------------------------------     MOPaoline- Sep 4th 2018- PDL-1 0%/pending.       Carcinoma of upper-outer quadrant of left breast in female, estrogen receptor positive ([place]    INTERVAL HISTORY:  A very pleasant [age]ear old female patient with above history of Recurrent stage IV ER/PR positive HER-2/neu NEGATIVE [status post liver biopsy February 2018] - is currently on Faslodex-Abemaciclib [ambema  At  5012m weeks on 1 week off] is here for follow-up.  Patient denies any fevers or chills. Denies any nausea vomiting diarrhea. No headaches. She is getting more active each day. No falls. Patient has not had any recent hospitalizations. No fevers or chills. Appetite improving. Weight gain. No abdominal distention or swelling in the legs. She continues to drive. No seizures.  REVIEW OF SYSTEMS:  A complete 10 point review of system is done which is negative except mentioned above/history of present illness.   PAST MEDICAL HISTORY :  Past Medical History:  Diagnosis Date  . Chemotherapy-induced peripheral neuropathy ([place] . Epistaxis    a. 11/2016 in setting of asa/plavix-->silver nitrate cauterization.  . GI bleed    a. 11/2016 Admission w/ GIB and hypovolemic shock req 3u PRBC's;  b. 11/2016 ECG: gastritis & nonbleeding peptic ulcer; c. 11/2016 Conlonoscopy: rectal and sigmoid colonic ulcers.  . Heart attack ([facility]  a. 1998 Cath @ [facility]: reportedly no intervention required.  . HMarland Kitchenrceptin-induced cardiomyopathy (HCC[place]  a. In the setting of Herceptin Rx for breast cancer (initiated 12/2014); b. 03/2015 MUGA EF 64%; b. 08/2015 MUGA: EF 51%; c. 10/2015 MUGA: EF 44%; d. 11/2015 Echo: EF 45-50%; e. 01/2016 MUGA: EF 60%; f. 06/2016 MUGA EF 65%; g. 10/2016 MUGA: EF 61%;  h. 12/2016 Echo: EF 55-60%, gr1 DD.  . HMarland Kitchenperlipidemia   . Hypertension   . Neuropathy   . Possible PAD (peripheral artery disease) ([place]CC[place]  a. 11/2016 LE cyanosis and weak pulses-->CTA w/o significant Ao-BiFem dzs. ?  distal dzs-->ASA/Plavix initiated by vascular surgery.  Marland Kitchen PSVT (paroxysmal supraventricular tachycardia) ([place])    a. Dx 11/2016.  . Pulmonary embolism ([place])    a. 12/2016 CTA Chest: small nonocclusive PE in inferior segment of the Left lingula, somewhat eccentric filling defect suggesting chronic rather than acute embolic event; b. 10/8313 LE U/S:  No DVT; c. 12/2016 Echo: Nl RV fxn, nl PASP.  Marland Kitchen Recurrent Metastatic breast cancer  ([place])    a. Dx 2016: Stage II, ER positive, PR positive, HER-2/neu overexpressing of the left breast-->chemo/radiation; b. 10/2016 CT Abd/pelvis: diffuse liver mets, ill defined sclerotic bone lesions-T12;  c. 10/2016 MRI brain: metastatic lesion along L temporal lobe (19x83m) w/ extensive surrounding edema & 514mmidline shift to right.  . Sinus tachycardia     PAST SURGICAL HISTORY :   Past Surgical History:  Procedure Laterality Date  . BREAST BIOPSY Left 2016   Positive  . BREAST LUMPECTOMY WITH SENTINEL LYMPH NODE BIOPSY Left 05/23/2015   Procedure: LEFT BREAST WIDE EXCISION WITH AXILLARY DISSECTION, MASTOPLASTY ;  Surgeon: JeRobert BellowMD;  Location: [facility] ORS;  Service: General;  Laterality: Left;  . BREAST SURGERY Left 12/18/14   breast biopsy/INVASIVE DUCTAL CARCINOMA OF BREAST, NOTTINGHAM GRADE 2.  . Marland KitchenREAST SURGERY  05/23/2015.   Wide excision/mastoplasty, axillary dissection. No residual invasive cancer, positive for residual DCIS. 0/2 nodes identified on axillary dissection. (no SLN by technetium or methylene blue)  . CARDIAC CATHETERIZATION    . COLONOSCOPY WITH PROPOFOL N/A 12/10/2016   Procedure: COLONOSCOPY WITH PROPOFOL;  Surgeon: DaLucilla LameMD;  Location: [facility] ENDOSCOPY;  Service: Endoscopy;  Laterality: N/A;  . COLONOSCOPY WITH PROPOFOL N/A 02/13/2017   Procedure: COLONOSCOPY WITH PROPOFOL;  Surgeon: WoLucilla LameMD;  [facility];  Service: Endoscopy;  Laterality: N/A;  . ESOPHAGOGASTRODUODENOSCOPY (EGD) WITH PROPOFOL N/A 12/08/2016   Procedure: ESOPHAGOGASTRODUODENOSCOPY (EGD) WITH PROPOFOL;  Surgeon: DaLucilla LameMD;  Location: [facility] ENDOSCOPY;  Service: Endoscopy;  Laterality: N/A;  . IVC FILTER INSERTION N/A 02/15/2017   Procedure: IVC Filter Insertion;  Surgeon: DeAlgernon HuxleyMD;  [facility];  Service: Cardiovascular;  Laterality: N/A;  . PORTACATH PLACEMENT Right 12-31-14   Dr ByBary Castilla  FAMILY HISTORY :   Family History  Problem  Relation Age of Onset  . Breast cancer Maternal Aunt   . Breast cancer Cousin   . Brain cancer Maternal Uncle   . Diabetes Mother   . Hypertension Mother   . Stroke Mother     SOCIAL HISTORY:   Social History   Tobacco Use  . Smoking status: Light Tobacco Smoker    Packs/day: 0.50    Years: 18.00    Pack years: 9.00    Types: Cigarettes  . Smokeless tobacco: Never Used  Substance Use Topics  . Alcohol use: No    Alcohol/week: 0.0 oz  . Drug use: No    ALLERGIES:  is allergic to no known allergies.  MEDICATIONS:  Current Outpatient Medications  Medication Sig Dispense Refill  . abemaciclib (VERZENIO) 50 MG tablet Take 1 tablet (5052mby mouth daily for 21 days, then off for 7 days. 21 tablet 3  . anastrozole (ARIMIDEX) 1 MG tablet Take 1 tablet (1 mg total) by mouth daily. 90 tablet 3  . diphenoxylate-atropine (LOMOTIL) 2.5-0.025 MG tablet Take 1 tablet by mouth 4 (four) times daily as needed for diarrhea or loose stools. Take it along with immodium 60 tablet 0  . feeding supplement, ENSURE  ENLIVE, (ENSURE ENLIVE) LIQD Take 237 mLs by mouth 2 (two) times daily between meals. 237 mL 12  . fluticasone (FLONASE) 50 MCG/ACT nasal spray Place 2 sprays into both nostrils daily. 16 g 0  . KLOR-CON M20 20 MEQ tablet TAKE ONE TABLE BY MOUTH TWICE A DAY 60 tablet 3  . magnesium oxide (MAG-OX) 400 MG tablet Take 1 tablet (400 mg total) by mouth 2 (two) times daily. 60 tablet 6  . ondansetron (ZOFRAN) 8 MG tablet Take 1 tablet (8 mg total) by mouth every 8 (eight) hours as needed for nausea or vomiting (start 3 days; after chemo). 40 tablet 1  . oxyCODONE (OXY IR/ROXICODONE) 5 MG immediate release tablet Take 1 tablet (5 mg total) by mouth every 8 (eight) hours as needed for severe pain. 42 tablet 0  . potassium chloride SA (K-DUR,KLOR-CON) 20 MEQ tablet Take 1 tablet (20 mEq total) by mouth daily. 90 tablet 3  . pregabalin (LYRICA) 75 MG capsule Take 1 capsule (75 mg total) by mouth 2  (two) times daily. 60 capsule 3  . ergocalciferol (VITAMIN D2) 50000 units capsule Take 1 capsule (50,000 Units total) once a week by mouth. 12 capsule 1   No current facility-administered medications for this visit.    Facility-Administered Medications Ordered in Other Visits  Medication Dose Route Frequency Provider Last Rate Last Dose  . 0.9 %  sodium chloride infusion   Intravenous Continuous Pandit, Sandeep, MD      . 0.9 %  sodium chloride infusion   Intravenous Continuous Lloyd Huger, MD 999 mL/hr at 04/24/15 1520    . heparin lock flush 100 unit/mL  500 Units Intravenous Once Berenzon, Dmitriy, MD      . sodium chloride 0.9 % injection 10 mL  10 mL Intravenous PRN [name], MD   10 mL at 04/04/15 1440  . sodium chloride flush (NS) 0.9 % injection 10 mL  10 mL Intravenous PRN Berenzon, Dmitriy, MD      . Tbo-Filgrastim [facility]) injection 480 mcg  480 mcg Subcutaneous Once Cammie Sickle, MD        PHYSICAL EXAMINATION: ECOG PERFORMANCE STATUS: 0 - Asymptomatic  BP 110/79 (BP Location: Left Arm, Patient Position: Sitting)   Pulse 89   Temp 97.9 F (36.6 C) (Tympanic)   Resp 16   Wt 150 lb 3.2 oz (68.1 kg)   BMI 24.24 kg/m   Filed Weights   08/11/17 1032  Weight: 150 lb 3.2 oz (68.1 kg)    GENERAL: Well-nourished well-developed; Alert, no distress and comfortable. Accompanied by her brother.  She is walking with a cane. EYES: no pallor. No jaundice. She is in a wheelchair  OROPHARYNX: No  ulceration; good dentition ; NECK: supple, no masses felt LYMPH:  no palpable lymphadenopathy in the cervical, axillary or inguinal regions LUNGS: clear to auscultation and  No wheeze or crackles HEART/CVS: regular rate & rhythm and no murmurs; NO bilateral lower extremity edema.  ABDOMEN:abdomen soft, non-tender and normal bowel sounds Musculoskeletal:no cyanosis of digits and no clubbing  PSYCH: alert & oriented x 3 with fluent speech NEURO: no focal  motor/sensory deficits- except for foot drop on the right side.   LABORATORY DATA:  I have reviewed the data as listed    Component Value Date/Time   NA 138 08/11/2017 1004   NA 135 03/04/2017 0918   NA 135 01/08/2015 0850   K 4.5 08/11/2017 1004   K 3.7 01/08/2015 0850   CL 102  08/11/2017 1004   CL 101 01/08/2015 0850   CO2 30 08/11/2017 1004   CO2 26 01/08/2015 0850   GLUCOSE 70 08/11/2017 1004   GLUCOSE 161 (H) 01/08/2015 0850   BUN 17 08/11/2017 1004   BUN 8 03/04/2017 0918   BUN 17 01/08/2015 0850   CREATININE 0.72 08/11/2017 1004   CREATININE 0.82 01/22/2015 1559   CALCIUM 9.5 08/11/2017 1004   CALCIUM 9.3 01/08/2015 0850   PROT 8.0 08/11/2017 1004   PROT 6.4 03/04/2017 0918   PROT 6.8 01/22/2015 1559   ALBUMIN 3.6 08/11/2017 1004   ALBUMIN 2.5 (L) 03/04/2017 0918   ALBUMIN 3.9 01/22/2015 1559   AST 34 08/11/2017 1004   AST 34 01/22/2015 1559   ALT 22 08/11/2017 1004   ALT 42 01/22/2015 1559   ALKPHOS 152 (H) 08/11/2017 1004   ALKPHOS 157 (H) 01/22/2015 1559   BILITOT 0.7 08/11/2017 1004   BILITOT 6.2 (H) 03/04/2017 0918   BILITOT 0.2 (L) 01/22/2015 1559   GFRNONAA >60 08/11/2017 1004   GFRNONAA >60 01/22/2015 1559   GFRAA >60 08/11/2017 1004   GFRAA >60 01/22/2015 1559    No results found for: SPEP, UPEP  Lab Results  Component Value Date   WBC 1.6 (L) 08/11/2017   NEUTROABS 0.7 (L) 08/11/2017   HGB 13.1 08/11/2017   HCT 39.0 08/11/2017   MCV 93.9 08/11/2017   PLT 144 (L) 08/11/2017      Chemistry      Component Value Date/Time   NA 138 08/11/2017 1004   NA 135 03/04/2017 0918   NA 135 01/08/2015 0850   K 4.5 08/11/2017 1004   K 3.7 01/08/2015 0850   CL 102 08/11/2017 1004   CL 101 01/08/2015 0850   CO2 30 08/11/2017 1004   CO2 26 01/08/2015 0850   BUN 17 08/11/2017 1004   BUN 8 03/04/2017 0918   BUN 17 01/08/2015 0850   CREATININE 0.72 08/11/2017 1004   CREATININE 0.82 01/22/2015 1559      Component Value Date/Time   CALCIUM 9.5  08/11/2017 1004   CALCIUM 9.3 01/08/2015 0850   ALKPHOS 152 (H) 08/11/2017 1004   ALKPHOS 157 (H) 01/22/2015 1559   AST 34 08/11/2017 1004   AST 34 01/22/2015 1559   ALT 22 08/11/2017 1004   ALT 42 01/22/2015 1559   BILITOT 0.7 08/11/2017 1004   BILITOT 6.2 (H) 03/04/2017 0918   BILITOT 0.2 (L) 01/22/2015 1559            LABORATORY DATA:  I have reviewed the data as listed    Component Value Date/Time   NA 138 08/11/2017 1004   NA 135 03/04/2017 0918   NA 135 01/08/2015 0850   K 4.5 08/11/2017 1004   K 3.7 01/08/2015 0850   CL 102 08/11/2017 1004   CL 101 01/08/2015 0850   CO2 30 08/11/2017 1004   CO2 26 01/08/2015 0850   GLUCOSE 70 08/11/2017 1004   GLUCOSE 161 (H) 01/08/2015 0850   BUN 17 08/11/2017 1004   BUN 8 03/04/2017 0918   BUN 17 01/08/2015 0850   CREATININE 0.72 08/11/2017 1004   CREATININE 0.82 01/22/2015 1559   CALCIUM 9.5 08/11/2017 1004   CALCIUM 9.3 01/08/2015 0850   PROT 8.0 08/11/2017 1004   PROT 6.4 03/04/2017 0918   PROT 6.8 01/22/2015 1559   ALBUMIN 3.6 08/11/2017 1004   ALBUMIN 2.5 (L) 03/04/2017 0918   ALBUMIN 3.9 01/22/2015 1559   AST 34 08/11/2017 1004  AST 34 01/22/2015 1559   ALT 22 08/11/2017 1004   ALT 42 01/22/2015 1559   ALKPHOS 152 (H) 08/11/2017 1004   ALKPHOS 157 (H) 01/22/2015 1559   BILITOT 0.7 08/11/2017 1004   BILITOT 6.2 (H) 03/04/2017 0918   BILITOT 0.2 (L) 01/22/2015 1559   GFRNONAA >60 08/11/2017 1004   GFRNONAA >60 01/22/2015 1559   GFRAA >60 08/11/2017 1004   GFRAA >60 01/22/2015 1559    No results found for: SPEP, UPEP  Lab Results  Component Value Date   WBC 1.6 (L) 08/11/2017   NEUTROABS 0.7 (L) 08/11/2017   HGB 13.1 08/11/2017   HCT 39.0 08/11/2017   MCV 93.9 08/11/2017   PLT 144 (L) 08/11/2017      Chemistry      Component Value Date/Time   NA 138 08/11/2017 1004   NA 135 03/04/2017 0918   NA 135 01/08/2015 0850   K 4.5 08/11/2017 1004   K 3.7 01/08/2015 0850   CL 102 08/11/2017 1004    CL 101 01/08/2015 0850   CO2 30 08/11/2017 1004   CO2 26 01/08/2015 0850   BUN 17 08/11/2017 1004   BUN 8 03/04/2017 0918   BUN 17 01/08/2015 0850   CREATININE 0.72 08/11/2017 1004   CREATININE 0.82 01/22/2015 1559      Component Value Date/Time   CALCIUM 9.5 08/11/2017 1004   CALCIUM 9.3 01/08/2015 0850   ALKPHOS 152 (H) 08/11/2017 1004   ALKPHOS 157 (H) 01/22/2015 1559   AST 34 08/11/2017 1004   AST 34 01/22/2015 1559   ALT 22 08/11/2017 1004   ALT 42 01/22/2015 1559   BILITOT 0.7 08/11/2017 1004   BILITOT 6.2 (H) 03/04/2017 0918   BILITOT 0.2 (L) 01/22/2015 1559     IMPRESSION: Normal left ventricular wall motion with estimated ejection fraction of 65%.   Electronically Signed   By: Marijo Sanes M.D.   On: 07/27/2016 09:04  RADIOGRAPHIC STUDIES: I have personally reviewed the radiological images as listed and agreed with the findings in the report. No results found.   ASSESSMENT & PLAN:  Carcinoma of upper-outer quadrant of left breast in female, estrogen receptor positive ([place]) # RECURRENT Metastatic breast cancer- ER/PR positive HER-2/neu negative.  # Currently on palliative Faslodex + Abemaciclib  at 78m/day [sec to neutropenia]. Tolerating well except for continued neutropenia.  No evidence of clinical progression.  # Clinically improving; AUG 6th 2018- CT scan shows improvement of the liver lesions; tumor markers improving. We will get a scan in appx 1 month.  # Continue Abemaciclib at 556mday. Today white count is  1.6 ANC 700 Hb-11; platelets-144. However asymptomatic. Continue Abema 508m3 weeks-ON and 1 week OFF.    # Brain mets- s/p RT [finished Feb 28th]; symptomatic improvement. Sep 25th MRI- significant improvement.   # ? Shoulder pain/bone pains- ? Mets vs faslodex. Recommend vit D weekly. Await CT scan as ordered today.   # PN G-1/ improving secondary to chemotherapy. Continue Lyrica; Continue oxycodone once a day.   # faslodex today; follow  up in 4 weeks/labs/faslodex again.CT scan prior.   Orders Placed This Encounter  Procedures  . CT CHEST W CONTRAST    Standing Status:   Future    Standing Expiration Date:   08/11/2018    Order Specific Question:   If indicated for the ordered procedure, I authorize the administration of contrast media per Radiology protocol    Answer:   Yes    Order Specific Question:  Preferred imaging location?    Answer:   [facility]    Order Specific Question:   Radiology Contrast Protocol - do NOT remove file path    Answer:   [email or web address]    Order Specific Question:   Reason for Exam additional comments    Answer:   breast cancer  . CT ABDOMEN PELVIS W CONTRAST    Standing Status:   Future    Standing Expiration Date:   08/11/2018    Order Specific Question:   If indicated for the ordered procedure, I authorize the administration of contrast media per Radiology protocol    Answer:   Yes    Order Specific Question:   Preferred imaging location?    Answer:   [facility]    Order Specific Question:   Radiology Contrast Protocol - do NOT remove file path    Answer:   [email or web address]  . CBC with Differential/Platelet    Standing Status:   Future    Standing Expiration Date:   08/11/2018  . Comprehensive metabolic panel    Standing Status:   Future    Standing Expiration Date:   08/11/2018  . Cancer antigen 27.29    Standing Status:   Future    Standing Expiration Date:   08/11/2018   All questions were answered. The patient knows to call the clinic with any problems, questions or concerns.      Cammie Sickle, MD 08/24/2017 8:27 PM

## 2017-08-11 NOTE — Assessment & Plan Note (Addendum)
#  RECURRENT Metastatic breast cancer- ER/PR positive HER-2/neu negative.  # Currently on palliative Faslodex + Abemaciclib  at '50mg'$ /day [sec to neutropenia]. Tolerating well except for continued neutropenia.  No evidence of clinical progression.  # Clinically improving; AUG 6th 2018- CT scan shows improvement of the liver lesions; tumor markers improving. We will get a scan in appx 1 month.  # Continue Abemaciclib at '50mg'$ /day. Today white count is  1.6 ANC 700 Hb-11; platelets-144. However asymptomatic. Continue Abema '50mg'$ - 3 weeks-ON and 1 week OFF.    # Brain mets- s/p RT [finished Feb 28th]; symptomatic improvement. Sep 25th MRI- significant improvement.   # ? Shoulder pain/bone pains- ? Mets vs faslodex. Recommend vit D weekly. Await CT scan as ordered today.   # PN G-1/ improving secondary to chemotherapy. Continue Lyrica; Continue oxycodone once a day.   # faslodex today; follow up in 4 weeks/labs/faslodex again.CT scan prior.

## 2017-08-12 LAB — CANCER ANTIGEN 27.29: CA 27.29: 17.2 U/mL (ref 0.0–38.6)

## 2017-09-08 ENCOUNTER — Inpatient Hospital Stay: Payer: BLUE CROSS/BLUE SHIELD

## 2017-09-08 ENCOUNTER — Inpatient Hospital Stay (HOSPITAL_BASED_OUTPATIENT_CLINIC_OR_DEPARTMENT_OTHER): Payer: BLUE CROSS/BLUE SHIELD | Admitting: Internal Medicine

## 2017-09-08 ENCOUNTER — Inpatient Hospital Stay: Payer: BLUE CROSS/BLUE SHIELD | Attending: Internal Medicine

## 2017-09-08 VITALS — BP 127/86 | HR 139 | Temp 97.1°F | Resp 16 | Wt 151.0 lb

## 2017-09-08 DIAGNOSIS — M8580 Other specified disorders of bone density and structure, unspecified site: Secondary | ICD-10-CM | POA: Insufficient documentation

## 2017-09-08 DIAGNOSIS — Z8711 Personal history of peptic ulcer disease: Secondary | ICD-10-CM

## 2017-09-08 DIAGNOSIS — Z170 Estrogen receptor positive status [ER+]: Secondary | ICD-10-CM

## 2017-09-08 DIAGNOSIS — Z79818 Long term (current) use of other agents affecting estrogen receptors and estrogen levels: Secondary | ICD-10-CM | POA: Insufficient documentation

## 2017-09-08 DIAGNOSIS — D701 Agranulocytosis secondary to cancer chemotherapy: Secondary | ICD-10-CM | POA: Insufficient documentation

## 2017-09-08 DIAGNOSIS — I10 Essential (primary) hypertension: Secondary | ICD-10-CM

## 2017-09-08 DIAGNOSIS — Z853 Personal history of malignant neoplasm of breast: Secondary | ICD-10-CM | POA: Insufficient documentation

## 2017-09-08 DIAGNOSIS — Z808 Family history of malignant neoplasm of other organs or systems: Secondary | ICD-10-CM | POA: Insufficient documentation

## 2017-09-08 DIAGNOSIS — Z86711 Personal history of pulmonary embolism: Secondary | ICD-10-CM | POA: Insufficient documentation

## 2017-09-08 DIAGNOSIS — Z803 Family history of malignant neoplasm of breast: Secondary | ICD-10-CM

## 2017-09-08 DIAGNOSIS — F17210 Nicotine dependence, cigarettes, uncomplicated: Secondary | ICD-10-CM

## 2017-09-08 DIAGNOSIS — I471 Supraventricular tachycardia: Secondary | ICD-10-CM

## 2017-09-08 DIAGNOSIS — C50412 Malignant neoplasm of upper-outer quadrant of left female breast: Secondary | ICD-10-CM

## 2017-09-08 DIAGNOSIS — E785 Hyperlipidemia, unspecified: Secondary | ICD-10-CM | POA: Insufficient documentation

## 2017-09-08 DIAGNOSIS — C787 Secondary malignant neoplasm of liver and intrahepatic bile duct: Secondary | ICD-10-CM | POA: Insufficient documentation

## 2017-09-08 DIAGNOSIS — Z923 Personal history of irradiation: Secondary | ICD-10-CM | POA: Insufficient documentation

## 2017-09-08 DIAGNOSIS — Z79899 Other long term (current) drug therapy: Secondary | ICD-10-CM

## 2017-09-08 DIAGNOSIS — Z9221 Personal history of antineoplastic chemotherapy: Secondary | ICD-10-CM | POA: Insufficient documentation

## 2017-09-08 DIAGNOSIS — C7931 Secondary malignant neoplasm of brain: Secondary | ICD-10-CM | POA: Insufficient documentation

## 2017-09-08 DIAGNOSIS — T451X5S Adverse effect of antineoplastic and immunosuppressive drugs, sequela: Secondary | ICD-10-CM | POA: Insufficient documentation

## 2017-09-08 DIAGNOSIS — E860 Dehydration: Secondary | ICD-10-CM

## 2017-09-08 LAB — CBC WITH DIFFERENTIAL/PLATELET
BASOS PCT: 1 %
Basophils Absolute: 0 10*3/uL (ref 0–0.1)
EOS PCT: 3 %
Eosinophils Absolute: 0.1 10*3/uL (ref 0–0.7)
HEMATOCRIT: 41.1 % (ref 35.0–47.0)
Hemoglobin: 13.7 g/dL (ref 12.0–16.0)
Lymphocytes Relative: 28 %
Lymphs Abs: 0.7 10*3/uL — ABNORMAL LOW (ref 1.0–3.6)
MCH: 31.5 pg (ref 26.0–34.0)
MCHC: 33.3 g/dL (ref 32.0–36.0)
MCV: 94.6 fL (ref 80.0–100.0)
MONO ABS: 0.2 10*3/uL (ref 0.2–0.9)
MONOS PCT: 9 %
NEUTROS ABS: 1.6 10*3/uL (ref 1.4–6.5)
Neutrophils Relative %: 59 %
PLATELETS: 167 10*3/uL (ref 150–440)
RBC: 4.35 MIL/uL (ref 3.80–5.20)
RDW: 15.8 % — AB (ref 11.5–14.5)
WBC: 2.6 10*3/uL — ABNORMAL LOW (ref 3.6–11.0)

## 2017-09-08 LAB — COMPREHENSIVE METABOLIC PANEL
ALBUMIN: 3.7 g/dL (ref 3.5–5.0)
ALK PHOS: 143 U/L — AB (ref 38–126)
ALT: 29 U/L (ref 14–54)
AST: 39 U/L (ref 15–41)
Anion gap: 7 (ref 5–15)
BILIRUBIN TOTAL: 1 mg/dL (ref 0.3–1.2)
BUN: 21 mg/dL — AB (ref 6–20)
CALCIUM: 9.3 mg/dL (ref 8.9–10.3)
CO2: 28 mmol/L (ref 22–32)
CREATININE: 0.78 mg/dL (ref 0.44–1.00)
Chloride: 101 mmol/L (ref 101–111)
GFR calc Af Amer: >60 mL/min (ref >60)
GFR calc non Af Amer: >60 mL/min (ref >60)
GLUCOSE: 74 mg/dL (ref 65–99)
Potassium: 4.7 mmol/L (ref 3.5–5.1)
SODIUM: 136 mmol/L (ref 135–145)
Total Protein: 8.1 g/dL (ref 6.5–8.1)

## 2017-09-08 MED ORDER — OXYCODONE HCL 5 MG PO TABS: 5.0000 mg | ORAL_TABLET | Freq: Three times a day (TID) | ORAL | 0 refills | Status: DC | PRN

## 2017-09-08 MED ORDER — FULVESTRANT 250 MG/5ML IM SOLN
500.0000 mg | Freq: Once | INTRAMUSCULAR | Status: AC
  Administered 2017-09-08: 500 mg via INTRAMUSCULAR
  Filled 2017-09-08: qty 10

## 2017-09-08 NOTE — Assessment & Plan Note (Addendum)
#  RECURRENT Metastatic breast cancer- ER/PR positive HER-2/neu negative.  # Currently on palliative Faslodex + Abemaciclib  at '50mg'$ /day [sec to neutropenia 3 weeks on 1 week of]. Tolerating well-ANC today is 1.6.  No evidence of clinical progression.  Continue the same.  Await CT scan in the week.  # Brain mets- s/p RT [finished Feb 28th]; symptomatic improvement. Sep 25th MRI- significant improvement.   # Tachycardia/history of SVT- off flecanaide; go back on flecainide.  Patient states she will stop by her cardiologist office.  # PN G-1/ improving secondary to chemotherapy. Continue Lyrica; Continue oxycodone once a day. New script given.  # faslodex today; follow up in 4 weeks/labs/faslodex again; await CT scans-; will call with results.

## 2017-09-08 NOTE — Progress Notes (Signed)
[facility] OFFICE PROGRESS NOTE  Patient Care Team: [name], MD as PCP - General (Family Medicine) Bary Castilla, Forest Gleason, MD (General Surgery) [name], MD (Inactive) as Attending Physician (Internal Medicine)  No matching staging information was found for the patient.   Oncology History   # LEFT BREAST IDC; STAGE II [cT2N1] ER >90%; PR- 50-90%; her 2 NEU- POS; s/p Neoadj chemo; AUG 2016- TCH+P s/p Lumpec & partial ALND- path CR; s/p RT [finished Nov 2016];  adj Herceptin; HELD for Jan 19th 2017 [in DEC 2016-EF dropped from 63 to 51%; FEB 27th EF-42.9%]; JAN 2016 START Arimidex; May 2017- EF- 60%; May 24th 2017-Re-start Herceptin q 3W; July 28th STOP herceptin [finished 72m/m2; sec to Low EF; however 2017 Oct EF= 67%; improved]  # DEC 4th 2017- Start Neratinib 4 pills; DEC 11th 3 pills; STOPPED.  # FEB 2018- RECURRENCE ER/PR positive; Her 2 NEGATIVE [liver Bx]  # MARCH 1st 2018- Tax-Cytoxan [poor tol]  # FEB 2018- Brain mets [SBRT; Dr.Crystal; finished Feb 28th 2018]  # March 2018- Eribulin- poor tolerance  # June 8th 2018- Started Faslodex + Abema [abema-multiple interruptions sec to neutropenia]  # PN- G-2; may 2017- Cymbalta 693md;MARCH 2018-  acute vascular insuff of BIL LE [? Taxotere vasoconstriction on asprin]; # hemorragic shock LGIB [s/p colo; Dr.Wohl]    # April 2016- Liver Bx- NEG;   # Drop in EF from Herceptin [recovered OCt 27th- EF 65%]  # BMD- jan 2017- osteopenia  -----------------------------------------------------------     [place]- Sep 4th 2018- PDL-1 0%/pending.       Carcinoma of upper-outer quadrant of left breast in female, estrogen receptor positive ([place]    INTERVAL HISTORY:  A very pleasant [age]ear old female patient with above history of Recurrent stage IV ER/PR positive HER-2/neu NEGATIVE [status post liver biopsy February 2018] - is currently on Faslodex-Abemaciclib [ambema  At  5071m weeks on 1 week off] is here for follow-up.   Patient appetite is good.  Denies any weight loss.  Denies any headaches.  Denies any nausea vomiting.  No fevers or chills.  No hospitalizations.   Patient stopped taking flecainide; because of cost issues.  Denies any chest pains.  Denies any palpitations or falls. Patient awaiting to get CAT scans end of the week.  REVIEW OF SYSTEMS:  A complete 10 point review of system is done which is negative except mentioned above/history of present illness.   PAST MEDICAL HISTORY :  Past Medical History:  Diagnosis Date  . Chemotherapy[place]induced peripheral [place] ([place] . Epistaxis    a. 11/2016 in setting of asa/plavix-->silver nitrate cauterization.  . GI bleed    a. 11/2016 Admission w/ GIB and hypovolemic shock req 3u PRBC's;  b. 11/2016 ECG: gastritis & nonbleeding peptic ulcer; c. 11/2016 Conlonoscopy: rectal and sigmoid colonic ulcers.  . Heart attack ([place]  a. 1998 Cath @ [facility]: reportedly no intervention required.  . HMarland Kitchenrceptin-induced cardiomyopathy ([place]CC[place]  a. In the setting of Herceptin Rx for breast cancer (initiated 12/2014); b. 03/2015 MUGA EF 64%; b. 08/2015 MUGA: EF 51%; c. 10/2015 MUGA: EF 44%; d. 11/2015 Echo: EF 45-50%; e. 01/2016 MUGA: EF 60%; f. 06/2016 MUGA EF 65%; g. 10/2016 MUGA: EF 61%;  h. 12/2016 Echo: EF 55-60%, gr1 DD.  . HMarland Kitchenperlipidemia   . Hypertension   . Neuropathy   . Possible PAD (peripheral artery disease) ([place]  a. 11/2016 LE cyanosis and  weak pulses-->CTA w/o significant Ao-BiFem dzs. ? distal dzs-->ASA/Plavix initiated by vascular surgery.  Marland Kitchen PSVT (paroxysmal supraventricular tachycardia) ([place])    a. Dx 11/2016.  . Pulmonary embolism ([place])    a. 12/2016 CTA Chest: small nonocclusive PE in inferior segment of the Left lingula, somewhat eccentric filling defect suggesting chronic rather than acute embolic event; b. 10/9922 LE U/S:  No DVT; c. 12/2016 Echo: Nl RV fxn, nl PASP.  Marland Kitchen Recurrent Metastatic breast cancer ([name])     a. Dx 2016: Stage II, ER positive, PR positive, HER-2/neu overexpressing of the left breast-->chemo/radiation; b. 10/2016 CT Abd/pelvis: diffuse liver mets, ill defined sclerotic bone lesions-T12;  c. 10/2016 MRI brain: metastatic lesion along L temporal lobe (19x77m) w/ extensive surrounding edema & 5107mmidline shift to right.  . Sinus tachycardia     PAST SURGICAL HISTORY :   Past Surgical History:  Procedure Laterality Date  . BREAST BIOPSY Left 2016   Positive  . BREAST LUMPECTOMY WITH SENTINEL LYMPH NODE BIOPSY Left 05/23/2015   Procedure: LEFT BREAST WIDE EXCISION WITH AXILLARY DISSECTION, MASTOPLASTY ;  Surgeon: JeRobert BellowMD;  Location: [facility] ORS;  Service: General;  Laterality: Left;  . BREAST SURGERY Left 12/18/14   breast biopsy/INVASIVE DUCTAL CARCINOMA OF BREAST, NOTTINGHAM GRADE 2.  . Marland KitchenREAST SURGERY  05/23/2015.   Wide excision/mastoplasty, axillary dissection. No residual invasive cancer, positive for residual DCIS. 0/2 nodes identified on axillary dissection. (no SLN by technetium or methylene blue)  . CARDIAC CATHETERIZATION    . COLONOSCOPY WITH PROPOFOL N/A 12/10/2016   Procedure: COLONOSCOPY WITH PROPOFOL;  Surgeon: DaLucilla LameMD;  Location: [facility] ENDOSCOPY;  Service: Endoscopy;  Laterality: N/A;  . COLONOSCOPY WITH PROPOFOL N/A 02/13/2017   Procedure: COLONOSCOPY WITH PROPOFOL;  Surgeon: WoLucilla LameMD;  [facility];  Service: Endoscopy;  Laterality: N/A;  . ESOPHAGOGASTRODUODENOSCOPY (EGD) WITH PROPOFOL N/A 12/08/2016   Procedure: ESOPHAGOGASTRODUODENOSCOPY (EGD) WITH PROPOFOL;  Surgeon: DaLucilla LameMD;  Location: [facility] END[facility]COPY;  Service: Endoscopy;  Laterality: N/A;  . IVC FILTER INSERTION N/A 02/15/2017   Procedure: IVC Filter Insertion;  Surgeon: DeAlgernon HuxleyMD;  [facility];  Service: Cardiovascular;  Laterality: N/A;  . PORTACATH PLACEMENT Right 12-31-14   Dr ByBary Castilla  FAMILY HISTORY :   Family History  Problem  Relation Age of Onset  . Breast cancer Maternal Aunt   . Breast cancer Cousin   . Brain cancer Maternal Uncle   . Diabetes Mother   . Hypertension Mother   . Stroke Mother     SOCIAL HISTORY:   Social History   Tobacco Use  . Smoking status: Light Tobacco Smoker    Packs/day: 0.50    Years: 18.00    Pack years: 9.00    Types: Cigarettes  . Smokeless tobacco: Never Used  Substance Use Topics  . Alcohol use: No    Alcohol/week: 0.0 oz  . Drug use: No    ALLERGIES:  is allergic to no known allergies.  MEDICATIONS:  Current Outpatient Medications  Medication Sig Dispense Refill  . abemaciclib (VERZENIO) 50 MG tablet Take 1 tablet (5027mby mouth daily for 21 days, then off for 7 days. 21 tablet 3  . anastrozole (ARIMIDEX) 1 MG tablet Take 1 tablet (1 mg total) by mouth daily. 90 tablet 3  . diphenoxylate-atropine (LOMOTIL) 2.5-0.025 MG tablet Take 1 tablet by mouth 4 (four) times daily as needed for diarrhea or loose stools. Take it along with immodium 60  tablet 0  . ergocalciferol (VITAMIN D2) 50000 units capsule Take 1 capsule (50,000 Units total) once a week by mouth. 12 capsule 1  . feeding supplement, ENSURE ENLIVE, (ENSURE ENLIVE) LIQD Take 237 mLs by mouth 2 (two) times daily between meals. 237 mL 12  . fluticasone (FLONASE) 50 MCG/ACT nasal spray Place 2 sprays into both nostrils daily. 16 g 0  . KLOR-CON M20 20 MEQ tablet TAKE ONE TABLE BY MOUTH TWICE A DAY 60 tablet 3  . magnesium oxide (MAG-OX) 400 MG tablet Take 1 tablet (400 mg total) by mouth 2 (two) times daily. 60 tablet 6  . ondansetron (ZOFRAN) 8 MG tablet Take 1 tablet (8 mg total) by mouth every 8 (eight) hours as needed for nausea or vomiting (start 3 days; after chemo). 40 tablet 1  . oxyCODONE (OXY IR/ROXICODONE) 5 MG immediate release tablet Take 1 tablet (5 mg total) by mouth every 8 (eight) hours as needed for severe pain. 42 tablet 0  . potassium chloride SA (K-DUR,KLOR-CON) 20 MEQ tablet Take 1 tablet  (20 mEq total) by mouth daily. 90 tablet 3  . pregabalin (LYRICA) 75 MG capsule Take 1 capsule (75 mg total) by mouth 2 (two) times daily. 60 capsule 3   No current facility-administered medications for this visit.    Facility-Administered Medications Ordered in Other Visits  Medication Dose Route Frequency Provider Last Rate Last Dose  . 0.9 %  sodium chloride infusion   Intravenous Continuous Pandit, Sandeep, MD      . 0.9 %  sodium chloride infusion   Intravenous Continuous Lloyd Huger, MD 999 mL/hr at 04/24/15 1520    . heparin lock flush 100 unit/mL  500 Units Intravenous Once Berenzon, Dmitriy, MD      . sodium chloride 0.9 % injection 10 mL  10 mL Intravenous PRN [name], MD   10 mL at 04/04/15 1440  . sodium chloride flush (NS) 0.9 % injection 10 mL  10 mL Intravenous PRN Berenzon, Dmitriy, MD      . Tbo-Filgrastim (GRANIX) injection 480 mcg  480 mcg Subcutaneous Once Cammie Sickle, MD        PHYSICAL EXAMINATION: ECOG PERFORMANCE STATUS: 0 - Asymptomatic  BP 127/86 (BP Location: Left Arm, Patient Position: Sitting)   Pulse (!) 139   Temp (!) 97.1 F (36.2 C) (Tympanic)   Resp 16   Wt 151 lb (68.5 kg)   BMI 24.37 kg/m   Filed Weights   09/08/17 1039  Weight: 151 lb (68.5 kg)    GENERAL: Well-nourished well-developed; Alert, no distress and comfortable. Accompanied by her brother.  She is walking with a cane. EYES: no pallor. No jaundice. She is in a wheelchair  OROPHARYNX: No  ulceration; good dentition ; NECK: supple, no masses felt LYMPH:  no palpable lymphadenopathy in the cervical, axillary or inguinal regions LUNGS: clear to auscultation and  No wheeze or crackles HEART/CVS: regular rate & rhythm and no murmurs; NO bilateral lower extremity edema.  ABDOMEN:abdomen soft, non-tender and normal bowel sounds Musculoskeletal:no cyanosis of digits and no clubbing  PSYCH: alert & oriented x 3 with fluent speech NEURO: no focal motor/sensory  deficits- except for foot drop on the right side.   LABORATORY DATA:  I have reviewed the data as listed    Component Value Date/Time   NA 136 09/08/2017 1020   NA 135 03/04/2017 0918   NA 135 01/08/2015 0850   K 4.7 09/08/2017 1020   K 3.7 01/08/2015  0850   CL 101 09/08/2017 1020   CL 101 01/08/2015 0850   CO2 28 09/08/2017 1020   CO2 26 01/08/2015 0850   GLUCOSE 74 09/08/2017 1020   GLUCOSE 161 (H) 01/08/2015 0850   BUN 21 (H) 09/08/2017 1020   BUN 8 03/04/2017 0918   BUN 17 01/08/2015 0850   CREATININE 0.78 09/08/2017 1020   CREATININE 0.82 01/22/2015 1559   CALCIUM 9.3 09/08/2017 1020   CALCIUM 9.3 01/08/2015 0850   PROT 8.1 09/08/2017 1020   PROT 6.4 03/04/2017 0918   PROT 6.8 01/22/2015 1559   ALBUMIN 3.7 09/08/2017 1020   ALBUMIN 2.5 (L) 03/04/2017 0918   ALBUMIN 3.9 01/22/2015 1559   AST 39 09/08/2017 1020   AST 34 01/22/2015 1559   ALT 29 09/08/2017 1020   ALT 42 01/22/2015 1559   ALKPHOS 143 (H) 09/08/2017 1020   ALKPHOS 157 (H) 01/22/2015 1559   BILITOT 1.0 09/08/2017 1020   BILITOT 6.2 (H) 03/04/2017 0918   BILITOT 0.2 (L) 01/22/2015 1559   GFRNONAA >60 09/08/2017 1020   GFRNONAA >60 01/22/2015 1559   GFRAA >60 09/08/2017 1020   GFRAA >60 01/22/2015 1559    No results found for: SPEP, UPEP  Lab Results  Component Value Date   WBC 2.6 (L) 09/08/2017   NEUTROABS 1.6 09/08/2017   HGB 13.7 09/08/2017   HCT 41.1 09/08/2017   MCV 94.6 09/08/2017   PLT 167 09/08/2017      Chemistry      Component Value Date/Time   NA 136 09/08/2017 1020   NA 135 03/04/2017 0918   NA 135 01/08/2015 0850   K 4.7 09/08/2017 1020   K 3.7 01/08/2015 0850   CL 101 09/08/2017 1020   CL 101 01/08/2015 0850   CO2 28 09/08/2017 1020   CO2 26 01/08/2015 0850   BUN 21 (H) 09/08/2017 1020   BUN 8 03/04/2017 0918   BUN 17 01/08/2015 0850   CREATININE 0.78 09/08/2017 1020   CREATININE 0.82 01/22/2015 1559      Component Value Date/Time   CALCIUM 9.3 09/08/2017 1020    CALCIUM 9.3 01/08/2015 0850   ALKPHOS 143 (H) 09/08/2017 1020   ALKPHOS 157 (H) 01/22/2015 1559   AST 39 09/08/2017 1020   AST 34 01/22/2015 1559   ALT 29 09/08/2017 1020   ALT 42 01/22/2015 1559   BILITOT 1.0 09/08/2017 1020   BILITOT 6.2 (H) 03/04/2017 0918   BILITOT 0.2 (L) 01/22/2015 1559            LABORATORY DATA:  I have reviewed the data as listed    Component Value Date/Time   NA 136 09/08/2017 1020   NA 135 03/04/2017 0918   NA 135 01/08/2015 0850   K 4.7 09/08/2017 1020   K 3.7 01/08/2015 0850   CL 101 09/08/2017 1020   CL 101 01/08/2015 0850   CO2 28 09/08/2017 1020   CO2 26 01/08/2015 0850   GLUCOSE 74 09/08/2017 1020   GLUCOSE 161 (H) 01/08/2015 0850   BUN 21 (H) 09/08/2017 1020   BUN 8 03/04/2017 0918   BUN 17 01/08/2015 0850   CREATININE 0.78 09/08/2017 1020   CREATININE 0.82 01/22/2015 1559   CALCIUM 9.3 09/08/2017 1020   CALCIUM 9.3 01/08/2015 0850   PROT 8.1 09/08/2017 1020   PROT 6.4 03/04/2017 0918   PROT 6.8 01/22/2015 1559   ALBUMIN 3.7 09/08/2017 1020   ALBUMIN 2.5 (L) 03/04/2017 0918   ALBUMIN 3.9 01/22/2015 1559  AST 39 09/08/2017 1020   AST 34 01/22/2015 1559   ALT 29 09/08/2017 1020   ALT 42 01/22/2015 1559   ALKPHOS 143 (H) 09/08/2017 1020   ALKPHOS 157 (H) 01/22/2015 1559   BILITOT 1.0 09/08/2017 1020   BILITOT 6.2 (H) 03/04/2017 0918   BILITOT 0.2 (L) 01/22/2015 1559   GFRNONAA >60 09/08/2017 1020   GFRNONAA >60 01/22/2015 1559   GFRAA >60 09/08/2017 1020   GFRAA >60 01/22/2015 1559    No results found for: SPEP, UPEP  Lab Results  Component Value Date   WBC 2.6 (L) 09/08/2017   NEUTROABS 1.6 09/08/2017   HGB 13.7 09/08/2017   HCT 41.1 09/08/2017   MCV 94.6 09/08/2017   PLT 167 09/08/2017      Chemistry      Component Value Date/Time   NA 136 09/08/2017 1020   NA 135 03/04/2017 0918   NA 135 01/08/2015 0850   K 4.7 09/08/2017 1020   K 3.7 01/08/2015 0850   CL 101 09/08/2017 1020   CL 101 01/08/2015  0850   CO2 28 09/08/2017 1020   CO2 26 01/08/2015 0850   BUN 21 (H) 09/08/2017 1020   BUN 8 03/04/2017 0918   BUN 17 01/08/2015 0850   CREATININE 0.78 09/08/2017 1020   CREATININE 0.82 01/22/2015 1559      Component Value Date/Time   CALCIUM 9.3 09/08/2017 1020   CALCIUM 9.3 01/08/2015 0850   ALKPHOS 143 (H) 09/08/2017 1020   ALKPHOS 157 (H) 01/22/2015 1559   AST 39 09/08/2017 1020   AST 34 01/22/2015 1559   ALT 29 09/08/2017 1020   ALT 42 01/22/2015 1559   BILITOT 1.0 09/08/2017 1020   BILITOT 6.2 (H) 03/04/2017 0918   BILITOT 0.2 (L) 01/22/2015 1559     IMPRESSION: Normal left ventricular wall motion with estimated ejection fraction of 65%.   Electronically Signed   By: Marijo Sanes M.D.   On: 07/27/2016 09:04  RADIOGRAPHIC STUDIES: I have personally reviewed the radiological images as listed and agreed with the findings in the report. No results found.   ASSESSMENT & PLAN:  Carcinoma of upper-outer quadrant of left breast in female, estrogen receptor positive ([place]) # RECURRENT Metastatic breast cancer- ER/PR positive HER-2/neu negative.  # Currently on palliative Faslodex + Abemaciclib  at 76m/day [sec to neutropenia 3 weeks on 1 week of]. Tolerating well-ANC today is 1.6.  No evidence of clinical progression.  Continue the same.  Await CT scan in the week.  # Brain mets- s/p RT [finished Feb 28th]; symptomatic improvement. Sep 25th MRI- significant improvement.   # Tachycardia/history of SVT- off flecanaide; go back on flecainide.  Patient states she will stop by her cardiologist office.  # PN G-1/ improving secondary to chemotherapy. Continue Lyrica; Continue oxycodone once a day. New script given.  # faslodex today; follow up in 4 weeks/labs/faslodex again; await CT scans-; will call with results.   No orders of the defined types were placed in this encounter.  All questions were answered. The patient knows to call the clinic with any problems, questions  or concerns.      GCammie Sickle MD 09/08/2017 11:31 PM

## 2017-09-09 LAB — CANCER ANTIGEN 27.29: CAN 27.29: 17.2 U/mL (ref 0.0–38.6)

## 2017-09-10 ENCOUNTER — Telehealth: Payer: Self-pay | Admitting: Cardiovascular Disease

## 2017-09-10 ENCOUNTER — Ambulatory Visit: Payer: BLUE CROSS/BLUE SHIELD

## 2017-09-10 ENCOUNTER — Other Ambulatory Visit: Payer: Self-pay | Admitting: Cardiovascular Disease

## 2017-09-10 ENCOUNTER — Encounter: Payer: Self-pay | Admitting: *Deleted

## 2017-09-10 ENCOUNTER — Ambulatory Visit: Admission: RE | Admit: 2017-09-10 | Payer: BLUE CROSS/BLUE SHIELD | Source: Ambulatory Visit

## 2017-09-10 ENCOUNTER — Telehealth: Payer: Self-pay | Admitting: *Deleted

## 2017-09-10 MED ORDER — FLECAINIDE ACETATE 50 MG PO TABS: 50.0000 mg | ORAL_TABLET | Freq: Two times a day (BID) | ORAL | 11 refills | Status: DC

## 2017-09-10 NOTE — Telephone Encounter (Signed)
Ok thanks 

## 2017-09-10 NOTE — Telephone Encounter (Signed)
I left a message for the patient to call. 

## 2017-09-10 NOTE — Telephone Encounter (Signed)
Pt returning our call °Please call back ° °

## 2017-09-10 NOTE — Telephone Encounter (Signed)
I spoke the patient. She was taken off flecainide for financial reasons on 07/15/17 by Dr. Fletcher Anon.   "Paroxysmal supraventricular tachycardia: most of her episodes were in the setting of acute illness and given that she is more stable, I elected to hold off on flecainide. She is having financial difficulties getting her medications. If she develops recurrent tachycardia and NSVT, I recommend resuming flecainide at 50 mg twice daily"  She states that she had recent follow up with her PCP and her HR was 139. She has periodically checked this at home and she has been between 120-130 bpm.  She is requesting that her flecainide be called back in to the pharmacy for her.   I advised I will call in flecainide 50 mg BID per Dr. Tyrell Antonio note.  [facility] [place].   Will forward to Dr. Fletcher Anon as an Juluis [place]ier.

## 2017-09-10 NOTE — Telephone Encounter (Signed)
Patient was taken off medication by Dr Fletcher Anon at last visit Dr Fletcher Anon mentioned that if HR went back up to contact office and they will begin medication again Patient is unsure of the name of medication Please call to discuss

## 2017-09-13 ENCOUNTER — Telehealth: Payer: Self-pay | Admitting: Family Medicine

## 2017-09-13 NOTE — Telephone Encounter (Signed)
Encounter opened in error

## 2017-09-13 NOTE — Telephone Encounter (Signed)
Copied from [place] [id number]. Topic: Inquiry >> Sep 13, 2017 10:43 AM Neva Seat wrote: Pt's PCP was Dr. Manuella Ghazi and now pt wants to establish care with Dr. Sanda Klein since brother Fred [place] is pt of Dr. Sanda Klein.  They also have a younger brother, Hall [place] that wants to establish care with Dr. Sanda Klein as well.  Please call pt back asap to schedule.

## 2017-09-13 NOTE — Telephone Encounter (Signed)
Tried returning patient call. Since she is a current patient here and her provider is leaving. It is okay for her to see Dr Sanda Klein once her provider leave on 11/12/16.  As far as her brother establishing care with Dr Sanda Klein: she is booked out until May for new patients, if he is still willing to wait then yes it is okay to put him on the schedule.

## 2017-10-05 ENCOUNTER — Ambulatory Visit
Admission: RE | Admit: 2017-10-05 | Discharge: 2017-10-05 | Disposition: A | Discharge status: Home / Self Care | Payer: BLUE CROSS/BLUE SHIELD | Source: Ambulatory Visit | Attending: Internal Medicine | Admitting: Internal Medicine

## 2017-10-05 DIAGNOSIS — M899 Disorder of bone, unspecified: Secondary | ICD-10-CM | POA: Insufficient documentation

## 2017-10-05 DIAGNOSIS — I700 Atherosclerosis of aorta: Secondary | ICD-10-CM | POA: Diagnosis not present

## 2017-10-05 DIAGNOSIS — R918 Other nonspecific abnormal finding of lung field: Secondary | ICD-10-CM | POA: Diagnosis not present

## 2017-10-05 DIAGNOSIS — C787 Secondary malignant neoplasm of liver and intrahepatic bile duct: Secondary | ICD-10-CM | POA: Diagnosis not present

## 2017-10-05 DIAGNOSIS — Z170 Estrogen receptor positive status [ER+]: Secondary | ICD-10-CM | POA: Insufficient documentation

## 2017-10-05 DIAGNOSIS — C50412 Malignant neoplasm of upper-outer quadrant of left female breast: Secondary | ICD-10-CM | POA: Diagnosis not present

## 2017-10-05 DIAGNOSIS — C50912 Malignant neoplasm of unspecified site of left female breast: Secondary | ICD-10-CM | POA: Diagnosis not present

## 2017-10-05 MED ORDER — IOPAMIDOL (ISOVUE-300) INJECTION 61%
100.0000 mL | Freq: Once | INTRAVENOUS | Status: AC | PRN
  Administered 2017-10-05: 100 mL via INTRAVENOUS

## 2017-10-06 ENCOUNTER — Inpatient Hospital Stay: Payer: BLUE CROSS/BLUE SHIELD

## 2017-10-06 ENCOUNTER — Inpatient Hospital Stay: Payer: BLUE CROSS/BLUE SHIELD | Attending: Internal Medicine | Admitting: Internal Medicine

## 2017-10-06 ENCOUNTER — Other Ambulatory Visit: Payer: Self-pay | Admitting: *Deleted

## 2017-10-06 VITALS — BP 105/72 | HR 106 | Temp 97.3°F | Resp 16 | Wt 157.2 lb

## 2017-10-06 DIAGNOSIS — F17210 Nicotine dependence, cigarettes, uncomplicated: Secondary | ICD-10-CM | POA: Diagnosis not present

## 2017-10-06 DIAGNOSIS — Z86711 Personal history of pulmonary embolism: Secondary | ICD-10-CM | POA: Diagnosis not present

## 2017-10-06 DIAGNOSIS — Z79899 Other long term (current) drug therapy: Secondary | ICD-10-CM

## 2017-10-06 DIAGNOSIS — C50412 Malignant neoplasm of upper-outer quadrant of left female breast: Secondary | ICD-10-CM

## 2017-10-06 DIAGNOSIS — M8580 Other specified disorders of bone density and structure, unspecified site: Secondary | ICD-10-CM

## 2017-10-06 DIAGNOSIS — C7931 Secondary malignant neoplasm of brain: Secondary | ICD-10-CM | POA: Diagnosis not present

## 2017-10-06 DIAGNOSIS — M21371 Foot drop, right foot: Secondary | ICD-10-CM | POA: Diagnosis not present

## 2017-10-06 DIAGNOSIS — Z853 Personal history of malignant neoplasm of breast: Secondary | ICD-10-CM | POA: Insufficient documentation

## 2017-10-06 DIAGNOSIS — E785 Hyperlipidemia, unspecified: Secondary | ICD-10-CM | POA: Insufficient documentation

## 2017-10-06 DIAGNOSIS — D709 Neutropenia, unspecified: Secondary | ICD-10-CM | POA: Insufficient documentation

## 2017-10-06 DIAGNOSIS — Z170 Estrogen receptor positive status [ER+]: Principal | ICD-10-CM

## 2017-10-06 DIAGNOSIS — Z808 Family history of malignant neoplasm of other organs or systems: Secondary | ICD-10-CM | POA: Diagnosis not present

## 2017-10-06 DIAGNOSIS — Z803 Family history of malignant neoplasm of breast: Secondary | ICD-10-CM | POA: Insufficient documentation

## 2017-10-06 DIAGNOSIS — I10 Essential (primary) hypertension: Secondary | ICD-10-CM | POA: Diagnosis not present

## 2017-10-06 DIAGNOSIS — R918 Other nonspecific abnormal finding of lung field: Secondary | ICD-10-CM | POA: Diagnosis not present

## 2017-10-06 DIAGNOSIS — Z79818 Long term (current) use of other agents affecting estrogen receptors and estrogen levels: Secondary | ICD-10-CM | POA: Diagnosis not present

## 2017-10-06 DIAGNOSIS — E860 Dehydration: Secondary | ICD-10-CM

## 2017-10-06 DIAGNOSIS — Z923 Personal history of irradiation: Secondary | ICD-10-CM | POA: Diagnosis not present

## 2017-10-06 DIAGNOSIS — Z9221 Personal history of antineoplastic chemotherapy: Secondary | ICD-10-CM

## 2017-10-06 DIAGNOSIS — C787 Secondary malignant neoplasm of liver and intrahepatic bile duct: Secondary | ICD-10-CM | POA: Diagnosis not present

## 2017-10-06 DIAGNOSIS — G620 Drug-induced polyneuropathy: Secondary | ICD-10-CM

## 2017-10-06 DIAGNOSIS — T451X5A Adverse effect of antineoplastic and immunosuppressive drugs, initial encounter: Secondary | ICD-10-CM

## 2017-10-06 DIAGNOSIS — I252 Old myocardial infarction: Secondary | ICD-10-CM | POA: Insufficient documentation

## 2017-10-06 LAB — CBC WITH DIFFERENTIAL/PLATELET
BASOS PCT: 0 %
Basophils Absolute: 0 10*3/uL (ref 0–0.1)
EOS PCT: 4 %
Eosinophils Absolute: 0.1 10*3/uL (ref 0–0.7)
HCT: 45.3 % (ref 35.0–47.0)
HEMOGLOBIN: 15.2 g/dL (ref 12.0–16.0)
LYMPHS ABS: 0.5 10*3/uL — AB (ref 1.0–3.6)
Lymphocytes Relative: 11 %
MCH: 31.7 pg (ref 26.0–34.0)
MCHC: 33.5 g/dL (ref 32.0–36.0)
MCV: 94.7 fL (ref 80.0–100.0)
MONOS PCT: 2 %
Monocytes Absolute: 0.1 10*3/uL — ABNORMAL LOW (ref 0.2–0.9)
NEUTROS PCT: 83 %
Neutro Abs: 3.3 10*3/uL (ref 1.4–6.5)
PLATELETS: 152 10*3/uL (ref 150–440)
RBC: 4.78 MIL/uL (ref 3.80–5.20)
RDW: 16.2 % — ABNORMAL HIGH (ref 11.5–14.5)
WBC: 4 10*3/uL (ref 3.6–11.0)

## 2017-10-06 LAB — COMPREHENSIVE METABOLIC PANEL
ALT: 23 U/L (ref 14–54)
AST: 39 U/L (ref 15–41)
Albumin: 3.5 g/dL (ref 3.5–5.0)
Alkaline Phosphatase: 146 U/L — ABNORMAL HIGH (ref 38–126)
Anion gap: 11 (ref 5–15)
BUN: 20 mg/dL (ref 6–20)
CO2: 24 mmol/L (ref 22–32)
Calcium: 8.9 mg/dL (ref 8.9–10.3)
Chloride: 101 mmol/L (ref 101–111)
Creatinine, Ser: 0.98 mg/dL (ref 0.44–1.00)
GFR calc Af Amer: >60 mL/min (ref >60)
GFR calc non Af Amer: >60 mL/min (ref >60)
Glucose, Bld: 152 mg/dL — ABNORMAL HIGH (ref 65–99)
Potassium: 4.3 mmol/L (ref 3.5–5.1)
Sodium: 136 mmol/L (ref 135–145)
Total Bilirubin: 1.4 mg/dL — ABNORMAL HIGH (ref 0.3–1.2)
Total Protein: 7.6 g/dL (ref 6.5–8.1)

## 2017-10-06 MED ORDER — PREGABALIN 75 MG PO CAPS: 75.0000 mg | ORAL_CAPSULE | Freq: Two times a day (BID) | ORAL | 3 refills | Status: DC

## 2017-10-06 MED ORDER — AZITHROMYCIN 250 MG PO TABS: ORAL_TABLET | ORAL | 0 refills | Status: DC

## 2017-10-06 MED ORDER — FULVESTRANT 250 MG/5ML IM SOLN
500.0000 mg | Freq: Once | INTRAMUSCULAR | Status: AC
  Administered 2017-10-06: 500 mg via INTRAMUSCULAR
  Filled 2017-10-06: qty 10

## 2017-10-06 NOTE — Progress Notes (Signed)
[place] OFFICE PROGRESS NOTE  Patient Care Team: [name], MD as PCP - General (Family Medicine) Bary Castilla, Forest Gleason, MD (General Surgery) [name], MD (Inactive) as Attending Physician (Internal Medicine)  No matching staging information was found for the patient.   Oncology History   # LEFT BREAST IDC; STAGE II [cT2N1] ER >90%; PR- 50-90%; her 2 NEU- POS; s/p Neoadj chemo; AUG 2016- TCH+P s/p Lumpec & partial ALND- path CR; s/p RT [finished Nov 2016];  adj Herceptin; HELD for Jan 19th 2017 [in DEC 2016-EF dropped from 63 to 51%; FEB 27th EF-42.9%]; JAN 2016 START Arimidex; May 2017- EF- 60%; May 24th 2017-Re-start Herceptin q 3W; July 28th STOP herceptin [finished 74m/m2; sec to Low EF; however 2017 Oct EF= 67%; improved]  # DEC 4th 2017- Start Neratinib 4 pills; DEC 11th 3 pills; STOPPED.  # FEB 2018- RECURRENCE ER/PR positive; Her 2 NEGATIVE [liver Bx]  # MARCH 1st 2018- Tax-Cytoxan [poor tol]  # FEB 2018- Brain mets [SBRT; Dr.Crystal; finished Feb 28th 2018]  # March 2018- Eribulin- poor tolerance  # June 8th 2018- Started Faslodex + Abema [abema-multiple interruptions sec to neutropenia]  # PN- G-2; may 2017- Cymbalta 630md;MARCH 2018-  acute vascular insuff of BIL LE [? Taxotere vasoconstriction on asprin]; # hemorragic shock LGIB [s/p colo; Dr.Wohl]    # April 2016- Liver Bx- NEG;   # Drop in EF from Herceptin [recovered OCt 27th- EF 65%]  # BMD- jan 2017- osteopenia  -----------------------------------------------------------     MOPump Backne- Sep 4th 2018- PDL-1 0%/pending.       Carcinoma of upper-outer quadrant of left breast in female, estrogen receptor positive ([place]    INTERVAL HISTORY:  A very pleasant [age]ear old female patient with above history of Recurrent stage IV ER/PR positive HER-2/neu NEGATIVE [status post liver biopsy February 2018] - is currently on Faslodex-Abemaciclib [ambema  At  5052m weeks on 1 week off] is here for follow-up.   Patient overall feels good.  She denies any fevers or chills.  No nausea no vomiting no diarrhea.  She is currently back on flecainide.  Denies any chest pain or shortness of breath or cough.  REVIEW OF SYSTEMS:  A complete 10 point review of system is done which is negative except mentioned above/history of present illness.   PAST MEDICAL HISTORY :  Past Medical History:  Diagnosis Date  . Chemotherapy-induced peripheral neuropathy ([place] . Epistaxis    a. 11/2016 in setting of asa/plavix-->silver nitrate cauterization.  . GI bleed    a. 11/2016 Admission w/ GIB and hypovolemic shock req 3u PRBC's;  b. 11/2016 ECG: gastritis & nonbleeding peptic ulcer; c. 11/2016 Conlonoscopy: rectal and sigmoid colonic ulcers.  . Heart attack ([place]  a. 1998 Cath @ [facility]: reportedly no intervention required.  . HMarland Kitchenrceptin-induced cardiomyopathy (HCCRich[place]  a. In the setting of Herceptin Rx for breast cancer (initiated 12/2014); b. 03/2015 MUGA EF 64%; b. 08/2015 MUGA: EF 51%; c. 10/2015 MUGA: EF 44%; d. 11/2015 Echo: EF 45-50%; e. 01/2016 MUGA: EF 60%; f. 06/2016 MUGA EF 65%; g. 10/2016 MUGA: EF 61%;  h. 12/2016 Echo: EF 55-60%, gr1 DD.  . HMarland Kitchenperlipidemia   . Hypertension   . Neuropathy   . Possible PAD (peripheral artery disease) ([place]CC[place]  a. 11/2016 LE cyanosis and weak pulses-->CTA w/o significant Ao-BiFem dzs. ? distal dzs-->ASA/Plavix initiated by vascular surgery.  . PMarland KitchenVT (paroxysmal supraventricular tachycardia) ([place]CC[place]  a. Dx 11/2016.  . Pulmonary embolism ([place])    a. 12/2016 CTA Chest: small nonocclusive PE in inferior segment of the Left lingula, somewhat eccentric filling defect suggesting chronic rather than acute embolic event; b. 09/5828 LE U/S:  No DVT; c. 12/2016 Echo: Nl RV fxn, nl PASP.  Marland Kitchen Recurrent Metastatic breast cancer ([place])    a. Dx 2016: Stage II, ER positive, PR positive, HER-2/neu overexpressing of the left breast-->chemo/radiation; b. 10/2016 CT  Abd/pelvis: diffuse liver mets, ill defined sclerotic bone lesions-T12;  c. 10/2016 MRI brain: metastatic lesion along L temporal lobe (19x52m) w/ extensive surrounding edema & 523mmidline shift to right.  . Sinus tachycardia     PAST SURGICAL HISTORY :   Past Surgical History:  Procedure Laterality Date  . BREAST BIOPSY Left 2016   Positive  . BREAST LUMPECTOMY WITH SENTINEL LYMPH NODE BIOPSY Left 05/23/2015   Procedure: LEFT BREAST WIDE EXCISION WITH AXILLARY DISSECTION, MASTOPLASTY ;  Surgeon: JeRobert BellowMD;  Location: [facility] ORS;  Service: General;  Laterality: Left;  . BREAST SURGERY Left 12/18/14   breast biopsy/INVASIVE DUCTAL CARCINOMA OF BREAST, NOTTINGHAM GRADE 2.  . Marland KitchenREAST SURGERY  05/23/2015.   Wide excision/mastoplasty, axillary dissection. No residual invasive cancer, positive for residual DCIS. 0/2 nodes identified on axillary dissection. (no SLN by technetium or methylene blue)  . CARDIAC CATHETERIZATION    . COLONOSCOPY WITH PROPOFOL N/A 12/10/2016   Procedure: COLONOSCOPY WITH PROPOFOL;  Surgeon: DaLucilla LameMD;  Location: [facility] ENDOSCOPY;  Service: Endoscopy;  Laterality: N/A;  . COLONOSCOPY WITH PROPOFOL N/A 02/13/2017   Procedure: COLONOSCOPY WITH PROPOFOL;  Surgeon: WoLucilla LameMD;  [facility];  Service: Endoscopy;  Laterality: N/A;  . ESOPHAGOGASTRODUODENOSCOPY (EGD) WITH PROPOFOL N/A 12/08/2016   Procedure: ESOPHAGOGASTRODUODENOSCOPY (EGD) WITH PROPOFOL;  Surgeon: DaLucilla LameMD;  Location: [facility] ENDOSCOPY;  Service: Endoscopy;  Laterality: N/A;  . IVC FILTER INSERTION N/A 02/15/2017   Procedure: IVC Filter Insertion;  Surgeon: DeAlgernon HuxleyMD;  [facility];  Service: Cardiovascular;  Laterality: N/A;  . PORTACATH PLACEMENT Right 12-31-14   Dr ByBary Castilla  FAMILY HISTORY :   Family History  Problem Relation Age of Onset  . Breast cancer Maternal Aunt   . Breast cancer Cousin   . Brain cancer Maternal Uncle   . Diabetes Mother    . Hypertension Mother   . Stroke Mother     SOCIAL HISTORY:   Social History   Tobacco Use  . Smoking status: Light Tobacco Smoker    Packs/day: 0.50    Years: 18.00    Pack years: 9.00    Types: Cigarettes  . Smokeless tobacco: Never Used  Substance Use Topics  . Alcohol use: No    Alcohol/week: 0.0 oz  . Drug use: No    ALLERGIES:  is allergic to no known allergies.  MEDICATIONS:  Current Outpatient Medications  Medication Sig Dispense Refill  . abemaciclib (VERZENIO) 50 MG tablet Take 1 tablet (5045mby mouth daily for 21 days, then off for 7 days. 21 tablet 3  . anastrozole (ARIMIDEX) 1 MG tablet Take 1 tablet (1 mg total) by mouth daily. 90 tablet 3  . diphenoxylate-atropine (LOMOTIL) 2.5-0.025 MG tablet Take 1 tablet by mouth 4 (four) times daily as needed for diarrhea or loose stools. Take it along with immodium 60 tablet 0  . ergocalciferol (VITAMIN D2) 50000 units capsule Take 1 capsule (50,000 Units total) once a week by mouth. 12 capsule  1  . feeding supplement, ENSURE ENLIVE, (ENSURE ENLIVE) LIQD Take 237 mLs by mouth 2 (two) times daily between meals. 237 mL 12  . flecainide (TAMBOCOR) 50 MG tablet Take 1 tablet (50 mg total) by mouth 2 (two) times daily. 60 tablet 11  . fluticasone (FLONASE) 50 MCG/ACT nasal spray Place 2 sprays into both nostrils daily. 16 g 0  . KLOR-CON M20 20 MEQ tablet TAKE ONE TABLE BY MOUTH TWICE A DAY 60 tablet 3  . magnesium oxide (MAG-OX) 400 MG tablet Take 1 tablet (400 mg total) by mouth 2 (two) times daily. 60 tablet 6  . ondansetron (ZOFRAN) 8 MG tablet Take 1 tablet (8 mg total) by mouth every 8 (eight) hours as needed for nausea or vomiting (start 3 days; after chemo). 40 tablet 1  . oxyCODONE (OXY IR/ROXICODONE) 5 MG immediate release tablet Take 1 tablet (5 mg total) by mouth every 8 (eight) hours as needed for severe pain. 42 tablet 0  . potassium chloride SA (K-DUR,KLOR-CON) 20 MEQ tablet Take 1 tablet (20 mEq total) by mouth  daily. 90 tablet 3  . azithromycin (ZITHROMAX) 250 MG tablet Take 2 on day 1; and then 1 pill once a day. 6 each 0  . pregabalin (LYRICA) 75 MG capsule Take 1 capsule (75 mg total) by mouth 2 (two) times daily. 60 capsule 3   No current facility-administered medications for this visit.    Facility-Administered Medications Ordered in Other Visits  Medication Dose Route Frequency Provider Last Rate Last Dose  . 0.9 %  sodium chloride infusion   Intravenous Continuous Pandit, Sandeep, MD      . 0.9 %  sodium chloride infusion   Intravenous Continuous Lloyd Huger, MD 999 mL/hr at 04/24/15 1520    . heparin lock flush 100 unit/mL  500 Units Intravenous Once Berenzon, Dmitriy, MD      . sodium chloride 0.9 % injection 10 mL  10 mL Intravenous PRN [name], MD   10 mL at 04/04/15 1440  . sodium chloride flush (NS) 0.9 % injection 10 mL  10 mL Intravenous PRN Berenzon, Dmitriy, MD      . Tbo-Filgrastim (GRANIX) injection 480 mcg  480 mcg Subcutaneous Once Cammie Sickle, MD        PHYSICAL EXAMINATION: ECOG PERFORMANCE STATUS: 0 - Asymptomatic  BP 105/72 (BP Location: Right Arm, Patient Position: Sitting)   Pulse (!) 106   Temp (!) 97.3 F (36.3 C) (Tympanic)   Resp 16   Wt 157 lb 3.2 oz (71.3 kg)   BMI 25.37 kg/m   Filed Weights   10/06/17 0956 10/06/17 1000  Weight: 157 lb 3.2 oz (71.3 kg) 157 lb 3.2 oz (71.3 kg)    GENERAL: Well-nourished well-developed; Alert, no distress and comfortable. Accompanied by her brother.  She is walking with a cane. EYES: no pallor. No jaundice. She is in a wheelchair  OROPHARYNX: No  ulceration; good dentition ; NECK: supple, no masses felt LYMPH:  no palpable lymphadenopathy in the cervical, axillary or inguinal regions LUNGS: clear to auscultation and  No wheeze or crackles HEART/CVS: regular rate & rhythm and no murmurs; NO bilateral lower extremity edema.  ABDOMEN:abdomen soft, non-tender and normal bowel  sounds Musculoskeletal:no cyanosis of digits and no clubbing  PSYCH: alert & oriented x 3 with fluent speech NEURO: no focal motor/sensory deficits- except for foot drop on the right side.   LABORATORY DATA:  I have reviewed the data as listed  Component Value Date/Time   NA 136 10/06/2017 0930   NA 135 03/04/2017 0918   NA 135 01/08/2015 0850   K 4.3 10/06/2017 0930   K 3.7 01/08/2015 0850   CL 101 10/06/2017 0930   CL 101 01/08/2015 0850   CO2 24 10/06/2017 0930   CO2 26 01/08/2015 0850   GLUCOSE 152 (H) 10/06/2017 0930   GLUCOSE 161 (H) 01/08/2015 0850   BUN 20 10/06/2017 0930   BUN 8 03/04/2017 0918   BUN 17 01/08/2015 0850   CREATININE 0.98 10/06/2017 0930   CREATININE 0.82 01/22/2015 1559   CALCIUM 8.9 10/06/2017 0930   CALCIUM 9.3 01/08/2015 0850   PROT 7.6 10/06/2017 0930   PROT 6.4 03/04/2017 0918   PROT 6.8 01/22/2015 1559   ALBUMIN 3.5 10/06/2017 0930   ALBUMIN 2.5 (L) 03/04/2017 0918   ALBUMIN 3.9 01/22/2015 1559   AST 39 10/06/2017 0930   AST 34 01/22/2015 1559   ALT 23 10/06/2017 0930   ALT 42 01/22/2015 1559   ALKPHOS 146 (H) 10/06/2017 0930   ALKPHOS 157 (H) 01/22/2015 1559   BILITOT 1.4 (H) 10/06/2017 0930   BILITOT 6.2 (H) 03/04/2017 0918   BILITOT 0.2 (L) 01/22/2015 1559   GFRNONAA >60 10/06/2017 0930   GFRNONAA >60 01/22/2015 1559   GFRAA >60 10/06/2017 0930   GFRAA >60 01/22/2015 1559    No results found for: SPEP, UPEP  Lab Results  Component Value Date   WBC 4.0 10/06/2017   NEUTROABS 3.3 10/06/2017   HGB 15.2 10/06/2017   HCT 45.3 10/06/2017   MCV 94.7 10/06/2017   PLT 152 10/06/2017      Chemistry      Component Value Date/Time   NA 136 10/06/2017 0930   NA 135 03/04/2017 0918   NA 135 01/08/2015 0850   K 4.3 10/06/2017 0930   K 3.7 01/08/2015 0850   CL 101 10/06/2017 0930   CL 101 01/08/2015 0850   CO2 24 10/06/2017 0930   CO2 26 01/08/2015 0850   BUN 20 10/06/2017 0930   BUN 8 03/04/2017 0918   BUN 17 01/08/2015  0850   CREATININE 0.98 10/06/2017 0930   CREATININE 0.82 01/22/2015 1559      Component Value Date/Time   CALCIUM 8.9 10/06/2017 0930   CALCIUM 9.3 01/08/2015 0850   ALKPHOS 146 (H) 10/06/2017 0930   ALKPHOS 157 (H) 01/22/2015 1559   AST 39 10/06/2017 0930   AST 34 01/22/2015 1559   ALT 23 10/06/2017 0930   ALT 42 01/22/2015 1559   BILITOT 1.4 (H) 10/06/2017 0930   BILITOT 6.2 (H) 03/04/2017 0918   BILITOT 0.2 (L) 01/22/2015 1559            LABORATORY DATA:  I have reviewed the data as listed    Component Value Date/Time   NA 136 10/06/2017 0930   NA 135 03/04/2017 0918   NA 135 01/08/2015 0850   K 4.3 10/06/2017 0930   K 3.7 01/08/2015 0850   CL 101 10/06/2017 0930   CL 101 01/08/2015 0850   CO2 24 10/06/2017 0930   CO2 26 01/08/2015 0850   GLUCOSE 152 (H) 10/06/2017 0930   GLUCOSE 161 (H) 01/08/2015 0850   BUN 20 10/06/2017 0930   BUN 8 03/04/2017 0918   BUN 17 01/08/2015 0850   CREATININE 0.98 10/06/2017 0930   CREATININE 0.82 01/22/2015 1559   CALCIUM 8.9 10/06/2017 0930   CALCIUM 9.3 01/08/2015 0850   PROT 7.6 10/06/2017 0930  PROT 6.4 03/04/2017 0918   PROT 6.8 01/22/2015 1559   ALBUMIN 3.5 10/06/2017 0930   ALBUMIN 2.5 (L) 03/04/2017 0918   ALBUMIN 3.9 01/22/2015 1559   AST 39 10/06/2017 0930   AST 34 01/22/2015 1559   ALT 23 10/06/2017 0930   ALT 42 01/22/2015 1559   ALKPHOS 146 (H) 10/06/2017 0930   ALKPHOS 157 (H) 01/22/2015 1559   BILITOT 1.4 (H) 10/06/2017 0930   BILITOT 6.2 (H) 03/04/2017 0918   BILITOT 0.2 (L) 01/22/2015 1559   GFRNONAA >60 10/06/2017 0930   GFRNONAA >60 01/22/2015 1559   GFRAA >60 10/06/2017 0930   GFRAA >60 01/22/2015 1559    No results found for: SPEP, UPEP  Lab Results  Component Value Date   WBC 4.0 10/06/2017   NEUTROABS 3.3 10/06/2017   HGB 15.2 10/06/2017   HCT 45.3 10/06/2017   MCV 94.7 10/06/2017   PLT 152 10/06/2017      Chemistry      Component Value Date/Time   NA 136 10/06/2017 0930   NA  135 03/04/2017 0918   NA 135 01/08/2015 0850   K 4.3 10/06/2017 0930   K 3.7 01/08/2015 0850   CL 101 10/06/2017 0930   CL 101 01/08/2015 0850   CO2 24 10/06/2017 0930   CO2 26 01/08/2015 0850   BUN 20 10/06/2017 0930   BUN 8 03/04/2017 0918   BUN 17 01/08/2015 0850   CREATININE 0.98 10/06/2017 0930   CREATININE 0.82 01/22/2015 1559      Component Value Date/Time   CALCIUM 8.9 10/06/2017 0930   CALCIUM 9.3 01/08/2015 0850   ALKPHOS 146 (H) 10/06/2017 0930   ALKPHOS 157 (H) 01/22/2015 1559   AST 39 10/06/2017 0930   AST 34 01/22/2015 1559   ALT 23 10/06/2017 0930   ALT 42 01/22/2015 1559   BILITOT 1.4 (H) 10/06/2017 0930   BILITOT 6.2 (H) 03/04/2017 0918   BILITOT 0.2 (L) 01/22/2015 1559     IMPRESSION: Normal left ventricular wall motion with estimated ejection fraction of 65%.   Electronically Signed   By: Marijo Sanes M.D.   On: 07/27/2016 09:04  RADIOGRAPHIC STUDIES: I have personally reviewed the radiological images as listed and agreed with the findings in the report. Ct Chest W Contrast  Result Date: 10/05/2017 CLINICAL DATA:  Left breast cancer EXAM: CT CHEST, ABDOMEN, AND PELVIS WITH CONTRAST TECHNIQUE: Multidetector CT imaging of the chest, abdomen and pelvis was performed following the standard protocol during bolus administration of intravenous contrast. CONTRAST:  174m ISOVUE-300 IOPAMIDOL (ISOVUE-300) INJECTION 61% COMPARISON:  Abdomen and pelvis CT 05/03/2017.  Chest CT 01/13/2016 FINDINGS: CT CHEST FINDINGS Cardiovascular: The heart size is normal. No pericardial effusion. Coronary artery calcification is evident. Atherosclerotic calcification is noted in the wall of the thoracic aorta. Right Port-A-Cath tip is at the distal SVC. Mediastinum/Nodes: No mediastinal lymphadenopathy. There is no hilar lymphadenopathy. The esophagus has normal imaging features. There is no axillary lymphadenopathy. Lungs/Pleura: 4 centimeter area of ground-glass attenuation in  the anterior right upper lobe is new in the interval. 361mright upper lobe nodule is stable in the interval. New interstitial changes are seen in the left apex. Subpleural reticulation anterior left upper lobe is likely radiation related. 5 millimeters superior segment left lower lobe nodule described on the prior study is similar, measuring 4 millimeters today. Musculoskeletal: T4 inferior endplate sclerotic lesion may be a Schmorl's node, but is new since 01/12/2017. CT ABDOMEN PELVIS FINDINGS Hepatobiliary: Liver metastases again noted.  Index lesion in the lateral segment left liver measured previously at 4.0 x 1.8 cm measures 3.7 x 1.3 cm today. Index lesion inferior right lower lobe measured previously at 4.2 x 1.7 cm now measures 4.1 x 1.8 cm. There is no evidence for gallstones, gallbladder wall thickening, or pericholecystic fluid. No intrahepatic or extrahepatic biliary dilation. Pancreas: No focal mass lesion. No dilatation of the main duct. No intraparenchymal cyst. No peripancreatic edema. Spleen: No splenomegaly. No focal mass lesion. Adrenals/Urinary Tract: No adrenal nodule or mass. Kidneys are unremarkable. No evidence for hydroureter. The urinary bladder appears normal for the degree of distention. Stomach/Bowel: Stomach is nondistended. No gastric wall thickening. No evidence of outlet obstruction. Duodenum is normally positioned as is the ligament of Treitz. No small bowel wall thickening. No small bowel dilatation. The terminal ileum is normal. The appendix is not visualized, but there is no edema or inflammation in the region of the cecum. No gross colonic mass. No colonic wall thickening. No substantial diverticular change. Vascular/Lymphatic: There is abdominal aortic atherosclerosis without aneurysm. IVC filter noted. There is no gastrohepatic or hepatoduodenal ligament lymphadenopathy. No intraperitoneal or retroperitoneal lymphadenopathy. No pelvic sidewall lymphadenopathy. Reproductive:  Fibroids noted in the uterus. There is no adnexal mass. Other: No intraperitoneal free fluid. Musculoskeletal: Small sclerotic lesion in the T12 vertebral body is stable. IMPRESSION: 1. 4 centimeter focus of ground-glass attenuation in the anterior right upper lobe is associated with new interstitial opacity in the left apex. These changes may be infectious/inflammatory. Attention on follow-up recommended. 2. New sclerotic focus in the inferior endplate of T4, may be a Schmorl's node but is new since prior chest CT. Attention on follow-up recommended. The sclerotic lesion in the T12 vertebral body is stable. 3. Stable to slight decrease and liver metastases. 4.  Aortic Atherosclerois (ICD10-170.0) Electronically Signed   By: Misty Stanley M.D.   On: 10/05/2017 15:47   Ct Abdomen Pelvis W Contrast  Result Date: 10/05/2017 CLINICAL DATA:  Left breast cancer EXAM: CT CHEST, ABDOMEN, AND PELVIS WITH CONTRAST TECHNIQUE: Multidetector CT imaging of the chest, abdomen and pelvis was performed following the standard protocol during bolus administration of intravenous contrast. CONTRAST:  166m ISOVUE-300 IOPAMIDOL (ISOVUE-300) INJECTION 61% COMPARISON:  Abdomen and pelvis CT 05/03/2017.  Chest CT 01/13/2016 FINDINGS: CT CHEST FINDINGS Cardiovascular: The heart size is normal. No pericardial effusion. Coronary artery calcification is evident. Atherosclerotic calcification is noted in the wall of the thoracic aorta. Right Port-A-Cath tip is at the distal SVC. Mediastinum/Nodes: No mediastinal lymphadenopathy. There is no hilar lymphadenopathy. The esophagus has normal imaging features. There is no axillary lymphadenopathy. Lungs/Pleura: 4 centimeter area of ground-glass attenuation in the anterior right upper lobe is new in the interval. 384mright upper lobe nodule is stable in the interval. New interstitial changes are seen in the left apex. Subpleural reticulation anterior left upper lobe is likely radiation related. 5  millimeters superior segment left lower lobe nodule described on the prior study is similar, measuring 4 millimeters today. Musculoskeletal: T4 inferior endplate sclerotic lesion may be a Schmorl's node, but is new since 01/12/2017. CT ABDOMEN PELVIS FINDINGS Hepatobiliary: Liver metastases again noted. Index lesion in the lateral segment left liver measured previously at 4.0 x 1.8 cm measures 3.7 x 1.3 cm today. Index lesion inferior right lower lobe measured previously at 4.2 x 1.7 cm now measures 4.1 x 1.8 cm. There is no evidence for gallstones, gallbladder wall thickening, or pericholecystic fluid. No intrahepatic or extrahepatic biliary dilation. Pancreas: No  focal mass lesion. No dilatation of the main duct. No intraparenchymal cyst. No peripancreatic edema. Spleen: No splenomegaly. No focal mass lesion. Adrenals/Urinary Tract: No adrenal nodule or mass. Kidneys are unremarkable. No evidence for hydroureter. The urinary bladder appears normal for the degree of distention. Stomach/Bowel: Stomach is nondistended. No gastric wall thickening. No evidence of outlet obstruction. Duodenum is normally positioned as is the ligament of Treitz. No small bowel wall thickening. No small bowel dilatation. The terminal ileum is normal. The appendix is not visualized, but there is no edema or inflammation in the region of the cecum. No gross colonic mass. No colonic wall thickening. No substantial diverticular change. Vascular/Lymphatic: There is abdominal aortic atherosclerosis without aneurysm. IVC filter noted. There is no gastrohepatic or hepatoduodenal ligament lymphadenopathy. No intraperitoneal or retroperitoneal lymphadenopathy. No pelvic sidewall lymphadenopathy. Reproductive: Fibroids noted in the uterus. There is no adnexal mass. Other: No intraperitoneal free fluid. Musculoskeletal: Small sclerotic lesion in the T12 vertebral body is stable. IMPRESSION: 1. 4 centimeter focus of ground-glass attenuation in the  anterior right upper lobe is associated with new interstitial opacity in the left apex. These changes may be infectious/inflammatory. Attention on follow-up recommended. 2. New sclerotic focus in the inferior endplate of T4, may be a Schmorl's node but is new since prior chest CT. Attention on follow-up recommended. The sclerotic lesion in the T12 vertebral body is stable. 3. Stable to slight decrease and liver metastases. 4.  Aortic Atherosclerois (ICD10-170.0) Electronically Signed   By: Misty Stanley M.D.   On: 10/05/2017 15:47    IMPRESSION: 1. 4 centimeter focus of ground-glass attenuation in the anterior right upper lobe is associated with new interstitial opacity in the left apex. These changes may be infectious/inflammatory. Attention on follow-up recommended. 2. New sclerotic focus in the inferior endplate of T4, may be a Schmorl's node but is new since prior chest CT. Attention on follow-up recommended. The sclerotic lesion in the T12 vertebral body is stable. 3. Stable to slight decrease and liver metastases. 4.  Aortic Atherosclerois (ICD10-170.0)   Electronically Signed   By: Misty Stanley M.D.   On: 10/05/2017 15:47 ASSESSMENT & PLAN:  Carcinoma of upper-outer quadrant of left breast in female, estrogen receptor positive ([place]) # RECURRENT Metastatic breast cancer- ER/PR positive HER-2/neu negative. CT jan 8th - shows partial response/stable disease in liver; ? New 4 cm ground glass opacity in RUL [see discussion below]  # Currently on palliative Faslodex + Abemaciclib  at 52m/day [sec to neutropenia 3 weeks on 1 week of]. Tolerating well-ANC today is 2.2; recommend taking abema 50 mg/day- continuous.   # Brain mets- s/p RT [finished Feb 28th]; symptomatic improvement. Sep 25th MRI- significant improvement.   # RUL ground glass- recommend azithromycin; cxr in 4 weeks.   # Tachycardia/history of SVT- off flecanaide; back on flecainide.    # PN G-1/ improving secondary to  chemotherapy. Continue Lyrica; Continue oxycodone once a day. New script given.  # faslodex today; follow up in 4 weeks/labs/faslodex/ CXR prior.   Orders Placed This Encounter  Procedures  . CBC with Differential/Platelet    Standing Status:   Future    Standing Expiration Date:   10/06/2018  . Comprehensive metabolic panel    Standing Status:   Future    Standing Expiration Date:   10/06/2018  . Cancer antigen 27.29    Standing Status:   Future    Standing Expiration Date:   10/06/2018   All questions were answered. The patient knows  to call the clinic with any problems, questions or concerns.      Cammie Sickle, MD 10/06/2017 1:25 PM

## 2017-10-06 NOTE — Assessment & Plan Note (Addendum)
#  RECURRENT Metastatic breast cancer- ER/PR positive HER-2/neu negative. CT jan 8th - shows partial response/stable disease in liver; ? New 4 cm ground glass opacity in RUL [see discussion below]  # Currently on palliative Faslodex + Abemaciclib  at '50mg'$ /day [sec to neutropenia 3 weeks on 1 week of]. Tolerating well-ANC today is 2.2; recommend taking abema 50 mg/day- continuous.   # Brain mets- s/p RT [finished Feb 28th]; symptomatic improvement. Sep 25th MRI- significant improvement.   # RUL ground glass- recommend azithromycin; cxr in 4 weeks.   # Tachycardia/history of SVT- off flecanaide; back on flecainide.    # PN G-1/ improving secondary to chemotherapy. Continue Lyrica; Continue oxycodone once a day. New script given.  # faslodex today; follow up in 4 weeks/labs/faslodex/ CXR prior.

## 2017-10-07 ENCOUNTER — Other Ambulatory Visit: Payer: Self-pay | Admitting: *Deleted

## 2017-10-07 DIAGNOSIS — G620 Drug-induced polyneuropathy: Secondary | ICD-10-CM

## 2017-10-07 DIAGNOSIS — Z170 Estrogen receptor positive status [ER+]: Principal | ICD-10-CM

## 2017-10-07 DIAGNOSIS — T451X5A Adverse effect of antineoplastic and immunosuppressive drugs, initial encounter: Secondary | ICD-10-CM

## 2017-10-07 DIAGNOSIS — C50412 Malignant neoplasm of upper-outer quadrant of left female breast: Secondary | ICD-10-CM

## 2017-10-07 LAB — CANCER ANTIGEN 27.29: CAN 27.29: 19.1 U/mL (ref 0.0–38.6)

## 2017-10-07 MED ORDER — PREGABALIN 75 MG PO CAPS: 75.0000 mg | ORAL_CAPSULE | Freq: Two times a day (BID) | ORAL | 3 refills | Status: DC

## 2017-10-08 IMAGING — CR DG LUMBAR SPINE 2-3V
1 series · 3 of 3 positions shown · non-contrast
Comparison: None.

CLINICAL DATA: Right lower extremity weakness for several days

EXAM:
LUMBAR SPINE - 2-3 VIEW

[Series 1: dg lumbar spine 2-3 views · 0.14mm/px · 3 of 3 slices shown]
[im 1/3]
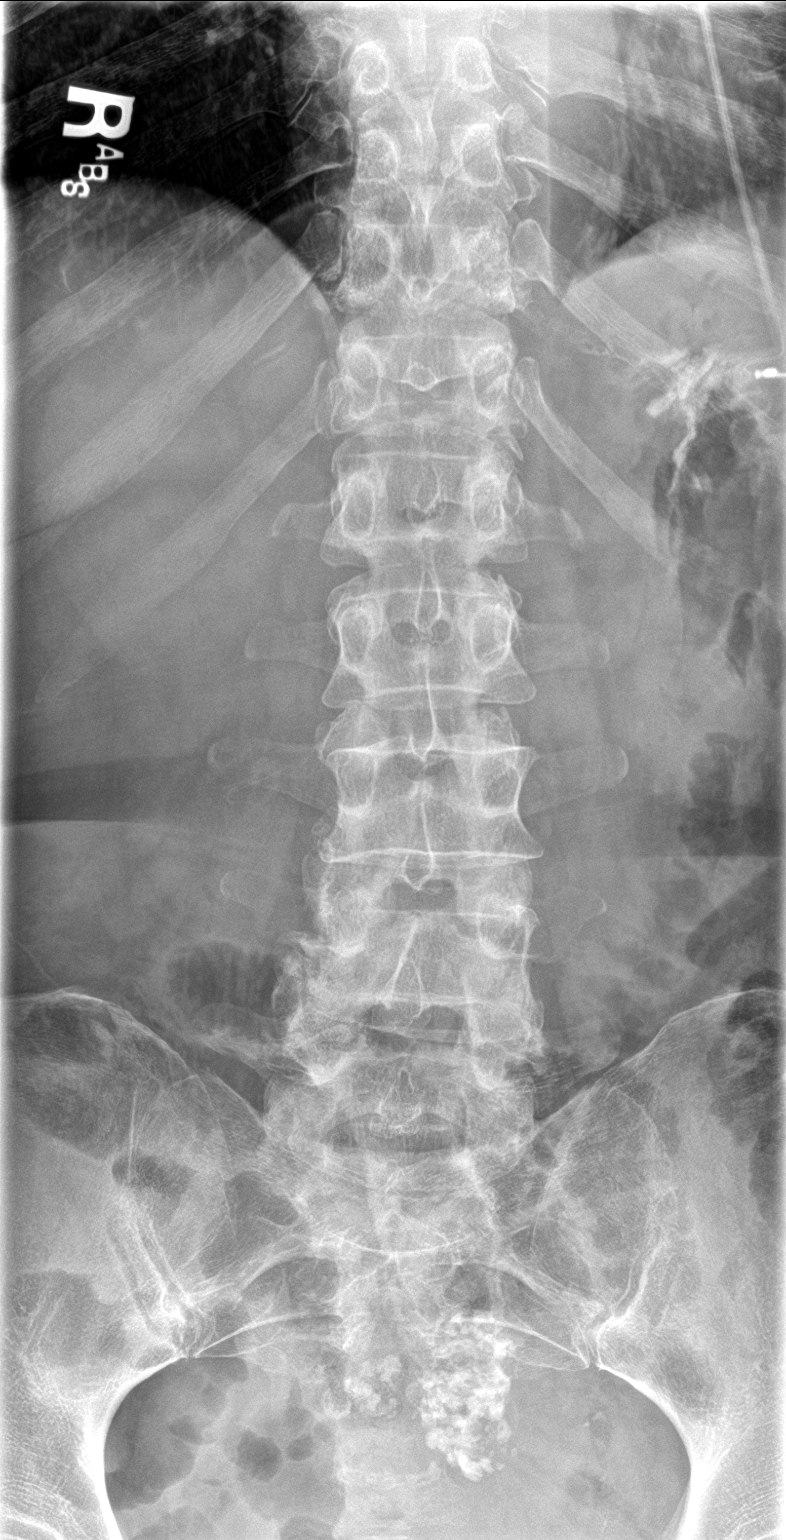
[im 2/3]
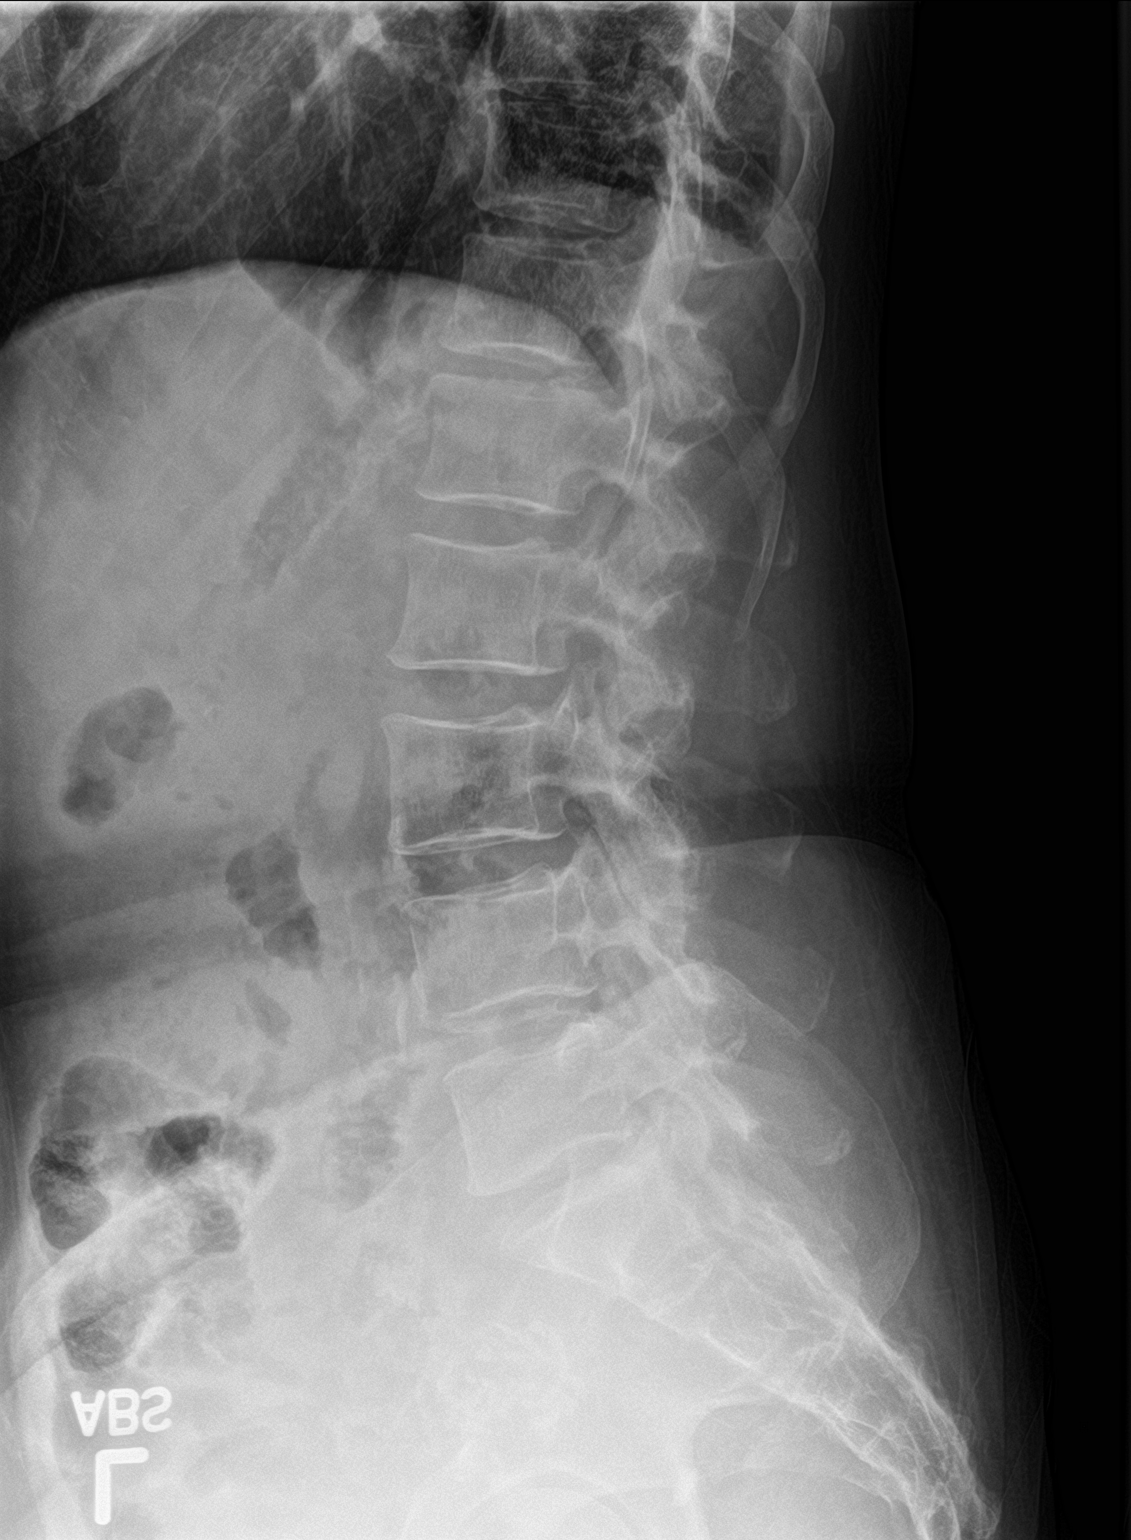
[im 3/3]
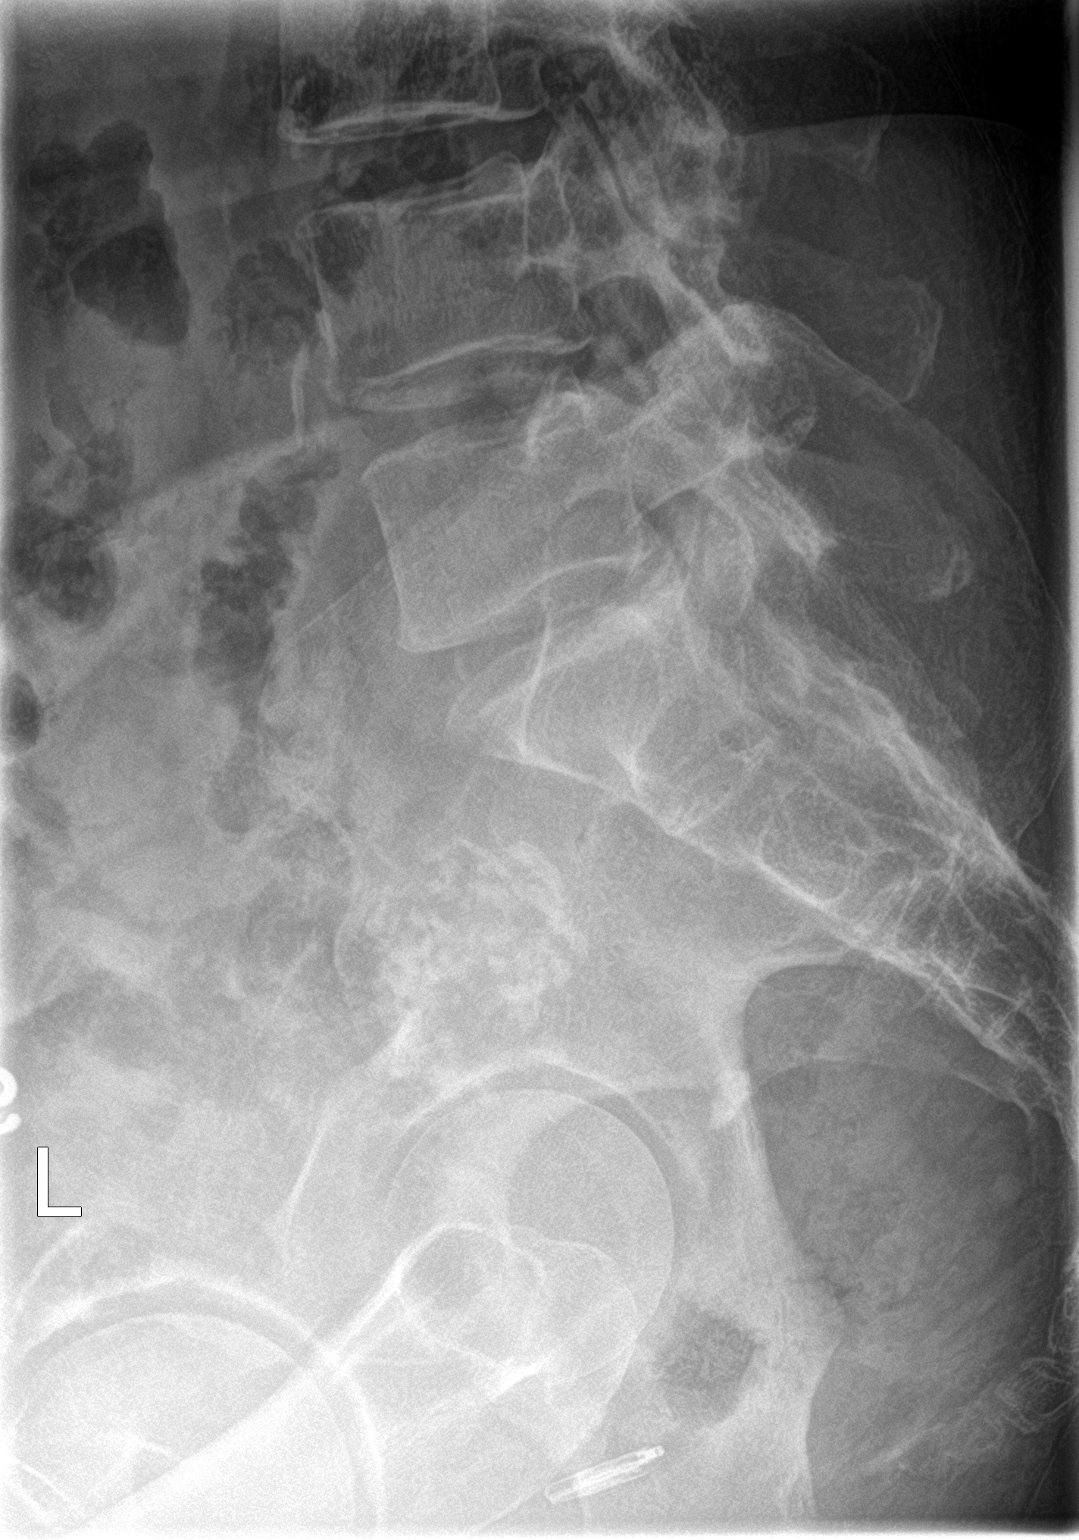

[3 of 3 positions shown; findings below may reference images not displayed]

FINDINGS: Frontal, lateral, and spot lumbosacral lateral images were obtained.
There are 5 non-rib-bearing lumbar type vertebral bodies. There is
no fracture or spondylolisthesis. There is mild disc space narrowing
at L4-5. The disc spaces appear unremarkable. No erosive change.
There is aortic atherosclerosis. Calcification in the pelvis is felt
to represent calcified uterine leiomyomas.
IMPRESSION: No fracture or spondylolisthesis. Disc space narrowing L4-5. Other
disc spaces appear unremarkable.

Calcified uterine leiomyomas.  Aortic atherosclerosis.

## 2017-11-03 ENCOUNTER — Encounter: Payer: Self-pay | Admitting: Internal Medicine

## 2017-11-03 ENCOUNTER — Inpatient Hospital Stay: Payer: BLUE CROSS/BLUE SHIELD | Attending: Internal Medicine

## 2017-11-03 ENCOUNTER — Inpatient Hospital Stay (HOSPITAL_BASED_OUTPATIENT_CLINIC_OR_DEPARTMENT_OTHER): Payer: BLUE CROSS/BLUE SHIELD | Admitting: Internal Medicine

## 2017-11-03 ENCOUNTER — Inpatient Hospital Stay: Payer: BLUE CROSS/BLUE SHIELD

## 2017-11-03 VITALS — BP 108/70 | HR 108 | Temp 97.3°F | Wt 159.0 lb

## 2017-11-03 DIAGNOSIS — C787 Secondary malignant neoplasm of liver and intrahepatic bile duct: Secondary | ICD-10-CM | POA: Diagnosis not present

## 2017-11-03 DIAGNOSIS — Z79899 Other long term (current) drug therapy: Secondary | ICD-10-CM

## 2017-11-03 DIAGNOSIS — Z923 Personal history of irradiation: Secondary | ICD-10-CM | POA: Insufficient documentation

## 2017-11-03 DIAGNOSIS — C50412 Malignant neoplasm of upper-outer quadrant of left female breast: Secondary | ICD-10-CM

## 2017-11-03 DIAGNOSIS — D709 Neutropenia, unspecified: Secondary | ICD-10-CM

## 2017-11-03 DIAGNOSIS — I10 Essential (primary) hypertension: Secondary | ICD-10-CM | POA: Diagnosis not present

## 2017-11-03 DIAGNOSIS — Z9221 Personal history of antineoplastic chemotherapy: Secondary | ICD-10-CM | POA: Insufficient documentation

## 2017-11-03 DIAGNOSIS — M21371 Foot drop, right foot: Secondary | ICD-10-CM

## 2017-11-03 DIAGNOSIS — E785 Hyperlipidemia, unspecified: Secondary | ICD-10-CM

## 2017-11-03 DIAGNOSIS — Z170 Estrogen receptor positive status [ER+]: Secondary | ICD-10-CM

## 2017-11-03 DIAGNOSIS — Z79818 Long term (current) use of other agents affecting estrogen receptors and estrogen levels: Secondary | ICD-10-CM | POA: Diagnosis not present

## 2017-11-03 DIAGNOSIS — C7931 Secondary malignant neoplasm of brain: Secondary | ICD-10-CM | POA: Diagnosis not present

## 2017-11-03 DIAGNOSIS — Z853 Personal history of malignant neoplasm of breast: Secondary | ICD-10-CM

## 2017-11-03 DIAGNOSIS — E860 Dehydration: Secondary | ICD-10-CM

## 2017-11-03 DIAGNOSIS — T451X5S Adverse effect of antineoplastic and immunosuppressive drugs, sequela: Secondary | ICD-10-CM

## 2017-11-03 DIAGNOSIS — Z86711 Personal history of pulmonary embolism: Secondary | ICD-10-CM

## 2017-11-03 DIAGNOSIS — G620 Drug-induced polyneuropathy: Secondary | ICD-10-CM

## 2017-11-03 DIAGNOSIS — M8580 Other specified disorders of bone density and structure, unspecified site: Secondary | ICD-10-CM | POA: Insufficient documentation

## 2017-11-03 DIAGNOSIS — Z95828 Presence of other vascular implants and grafts: Secondary | ICD-10-CM

## 2017-11-03 DIAGNOSIS — R918 Other nonspecific abnormal finding of lung field: Secondary | ICD-10-CM | POA: Insufficient documentation

## 2017-11-03 DIAGNOSIS — I700 Atherosclerosis of aorta: Secondary | ICD-10-CM | POA: Diagnosis not present

## 2017-11-03 DIAGNOSIS — I252 Old myocardial infarction: Secondary | ICD-10-CM

## 2017-11-03 DIAGNOSIS — F17210 Nicotine dependence, cigarettes, uncomplicated: Secondary | ICD-10-CM

## 2017-11-03 LAB — COMPREHENSIVE METABOLIC PANEL
ALT: 32 U/L (ref 14–54)
AST: 43 U/L — AB (ref 15–41)
Albumin: 3.6 g/dL (ref 3.5–5.0)
Alkaline Phosphatase: 151 U/L — ABNORMAL HIGH (ref 38–126)
Anion gap: 8 (ref 5–15)
BUN: 15 mg/dL (ref 6–20)
CALCIUM: 9 mg/dL (ref 8.9–10.3)
CO2: 27 mmol/L (ref 22–32)
CREATININE: 0.69 mg/dL (ref 0.44–1.00)
Chloride: 103 mmol/L (ref 101–111)
GFR calc Af Amer: >60 mL/min (ref >60)
Glucose, Bld: 59 mg/dL — ABNORMAL LOW (ref 65–99)
Potassium: 4.2 mmol/L (ref 3.5–5.1)
Sodium: 138 mmol/L (ref 135–145)
Total Bilirubin: 0.9 mg/dL (ref 0.3–1.2)
Total Protein: 7.6 g/dL (ref 6.5–8.1)

## 2017-11-03 LAB — CBC WITH DIFFERENTIAL/PLATELET
BASOS ABS: 0 10*3/uL (ref 0–0.1)
BASOS PCT: 1 %
EOS ABS: 0.2 10*3/uL (ref 0–0.7)
EOS PCT: 12 %
HCT: 41.2 % (ref 35.0–47.0)
Hemoglobin: 13.7 g/dL (ref 12.0–16.0)
Lymphocytes Relative: 45 %
Lymphs Abs: 0.9 10*3/uL — ABNORMAL LOW (ref 1.0–3.6)
MCH: 31.5 pg (ref 26.0–34.0)
MCHC: 33.1 g/dL (ref 32.0–36.0)
MCV: 95.1 fL (ref 80.0–100.0)
Monocytes Absolute: 0.2 10*3/uL (ref 0.2–0.9)
Monocytes Relative: 10 %
Neutro Abs: 0.6 10*3/uL — ABNORMAL LOW (ref 1.4–6.5)
Neutrophils Relative %: 32 %
PLATELETS: 138 10*3/uL — AB (ref 150–440)
RBC: 4.33 MIL/uL (ref 3.80–5.20)
RDW: 16.3 % — ABNORMAL HIGH (ref 11.5–14.5)
WBC: 1.9 10*3/uL — AB (ref 3.6–11.0)

## 2017-11-03 MED ORDER — FULVESTRANT 250 MG/5ML IM SOLN
500.0000 mg | Freq: Once | INTRAMUSCULAR | Status: AC
  Administered 2017-11-03: 500 mg via INTRAMUSCULAR
  Filled 2017-11-03: qty 10

## 2017-11-03 MED ORDER — SODIUM CHLORIDE 0.9% FLUSH
10.0000 mL | INTRAVENOUS | Status: AC | PRN
  Administered 2017-11-03: 10 mL
  Filled 2017-11-03: qty 10

## 2017-11-03 MED ORDER — HEPARIN SOD (PORK) LOCK FLUSH 100 UNIT/ML IV SOLN
500.0000 [IU] | INTRAVENOUS | Status: AC | PRN
  Administered 2017-11-03: 500 [IU]

## 2017-11-03 NOTE — Assessment & Plan Note (Addendum)
#  RECURRENT Metastatic breast cancer- ER/PR positive HER-2/neu negative. CT jan 8th - shows partial response/stable disease in liver; ? New 4 cm ground glass opacity in RUL [see discussion below]  # Currently on palliative Faslodex + Abemaciclib  at '50mg'$ /day;Tolerating well-ANC today is 0.6; continue recommend taking abema 50 mg/day.   # Brain mets- s/p RT [finished Feb 28th]; symptomatic improvement. Sep 25th MRI- significant improvement. Recommend MRI brain prior to next visit.    # Tachycardia/history of SVT- stable on flecainide.    # PN G-1/ improving secondary to chemotherapy. Continue Lyrica; Continue oxycodone once a day as needed.   # faslodex today; follow up in 4 weeks/labs/faslodex; MRI brain.

## 2017-11-03 NOTE — Progress Notes (Signed)
[facility] OFFICE PROGRESS NOTE  Patient Care Team: [name], MD as PCP - General (Family Medicine) Bary Castilla, Forest Gleason, MD (General Surgery) [name], MD (Inactive) as Attending Physician (Internal Medicine)  No matching staging information was found for the patient.   Oncology History   # LEFT BREAST IDC; STAGE II [cT2N1] ER >90%; PR- 50-90%; her 2 NEU- POS; s/p Neoadj chemo; AUG 2016- TCH+P s/p Lumpec & partial ALND- path CR; s/p RT [finished Nov 2016];  adj Herceptin; HELD for Jan 19th 2017 [in DEC 2016-EF dropped from 63 to 51%; FEB 27th EF-42.9%]; JAN 2016 START Arimidex; May 2017- EF- 60%; May 24th 2017-Re-start Herceptin q 3W; July 28th STOP herceptin [finished 42m/m2; sec to Low EF; however 2017 Oct EF= 67%; improved]  # DEC 4th 2017- Start Neratinib 4 pills; DEC 11th 3 pills; STOPPED.  # FEB 2018- RECURRENCE ER/PR positive; Her 2 NEGATIVE [liver Bx]  # MARCH 1st 2018- Tax-Cytoxan [poor tol]  # FEB 2018- Brain mets [SBRT; Dr.Crystal; finished Feb 28th 2018]  # March 2018- Eribulin- poor tolerance  # June 8th 2018- Started Faslodex + Abema [abema-multiple interruptions sec to neutropenia]  # PN- G-2; may 2017- Cymbalta 665md;MARCH 2018-  acute vascular insuff of BIL LE [? Taxotere vasoconstriction on asprin]; # hemorragic shock LGIB [s/p colo; Dr.Wohl]    # April 2016- Liver Bx- NEG;   # Drop in EF from Herceptin [recovered OCt 27th- EF 65%]  # BMD- jan 2017- osteopenia  -----------------------------------------------------------     [place]- PDL-1 0%/NEG for BRCA; MSI-Stable; Positive for PI3K; ccnd-1; FGF**     Carcinoma of upper-outer quadrant of left breast in female, estrogen receptor positive ([place]    INTERVAL HISTORY:  A very pleasant [age]ear old female patient with above history of Recurrent stage IV ER/PR positive HER-2/neu NEGATIVE [status post liver biopsy February 2018] - is  currently on Faslodex-Abemaciclib is here for a follow up.  No fever no chills.  Appetite is good.  She is gaining weight.  No nausea vomiting diarrhea.  Her heart rate is not better controlled since she is on flecainide.  Denies any headaches.  Denies any chest pain or shortness of breath or cough.  REVIEW OF SYSTEMS:  A complete 10 point review of system is done which is negative except mentioned above/history of present illness.   PAST MEDICAL HISTORY :  Past Medical History:  Diagnosis Date  . Chemotherapy-induced peripheral neuropathy (HC[place]  . Epistaxis    a. 11/2016 in setting of asa/plavix-->silver nitrate cauterization.  . GI bleed    a. 11/2016 Admission w/ GIB and hypovolemic shock req 3u PRBC's;  b. 11/2016 ECG: gastritis & nonbleeding peptic ulcer; c. 11/2016 Conlonoscopy: rectal and sigmoid colonic ulcers.  . Heart attack (HCM[place]   a. 1998 Cath @ [facility]: reportedly no intervention required.  . Marland Kitchenerceptin-induced cardiomyopathy (HCBeling[place]   a. In the setting of Herceptin Rx for breast cancer (initiated 12/2014); b. 03/2015 MUGA EF 64%; b. 08/2015 MUGA: EF 51%; c. 10/2015 MUGA: EF 44%; d. 11/2015 Echo: EF 45-50%; e. 01/2016 MUGA: EF 60%; f. 06/2016 MUGA EF 65%; g. 10/2016 MUGA: EF 61%;  h. 12/2016 Echo: EF 55-60%, gr1 DD.  . Marland Kitchenyperlipidemia   . Hypertension   . Neuropathy   . Possible PAD (peripheral artery disease) (HCWild Peach [place]   a. 11/2016 LE cyanosis and weak pulses-->CTA w/o significant Ao-BiFem dzs. ? distal dzs-->ASA/Plavix initiated by vascular surgery.  .Marland Kitchen  PSVT (paroxysmal supraventricular tachycardia) ([place])    a. Dx 11/2016.  . Pulmonary embolism ([place])    a. 12/2016 CTA Chest: small nonocclusive PE in inferior segment of the Left lingula, somewhat eccentric filling defect suggesting chronic rather than acute embolic event; b. 12/4032 LE U/S:  No DVT; c. 12/2016 Echo: Nl RV fxn, nl PASP.  Marland Kitchen Recurrent Metastatic breast cancer ([place])    a. Dx 2016: Stage II, ER positive, PR positive, HER-2/neu  overexpressing of the left breast-->chemo/radiation; b. 10/2016 CT Abd/pelvis: diffuse liver mets, ill defined sclerotic bone lesions-T12;  c. 10/2016 MRI brain: metastatic lesion along L temporal lobe (19x33m) w/ extensive surrounding edema & 560mmidline shift to right.  . Sinus tachycardia     PAST SURGICAL HISTORY :   Past Surgical History:  Procedure Laterality Date  . BREAST BIOPSY Left 2016   Positive  . BREAST LUMPECTOMY WITH SENTINEL LYMPH NODE BIOPSY Left 05/23/2015   Procedure: LEFT BREAST WIDE EXCISION WITH AXILLARY DISSECTION, MASTOPLASTY ;  Surgeon: JeRobert BellowMD;  Location: [facility] ORS;  Service: General;  Laterality: Left;  . BREAST SURGERY Left 12/18/14   breast biopsy/INVASIVE DUCTAL CARCINOMA OF BREAST, NOTTINGHAM GRADE 2.  . Marland KitchenREAST SURGERY  05/23/2015.   Wide excision/mastoplasty, axillary dissection. No residual invasive cancer, positive for residual DCIS. 0/2 nodes identified on axillary dissection. (no SLN by technetium or methylene blue)  . CARDIAC CATHETERIZATION    . COLONOSCOPY WITH PROPOFOL N/A 12/10/2016   Procedure: COLONOSCOPY WITH PROPOFOL;  Surgeon: DaLucilla LameMD;  Location: [facility] ENDOSCOPY;  Service: Endoscopy;  Laterality: N/A;  . COLONOSCOPY WITH PROPOFOL N/A 02/13/2017   Procedure: COLONOSCOPY WITH PROPOFOL;  Surgeon: WoLucilla LameMD;  [facility];  Service: Endoscopy;  Laterality: N/A;  . ESOPHAGOGASTRODUODENOSCOPY (EGD) WITH PROPOFOL N/A 12/08/2016   Procedure: ESOPHAGOGASTRODUODENOSCOPY (EGD) WITH PROPOFOL;  Surgeon: DaLucilla LameMD;  Location: [facility] ENDOSCOPY;  Service: Endoscopy;  Laterality: N/A;  . IVC FILTER INSERTION N/A 02/15/2017   Procedure: IVC Filter Insertion;  Surgeon: DeAlgernon HuxleyMD;  [facility];  Service: Cardiovascular;  Laterality: N/A;  . PORTACATH PLACEMENT Right 12-31-14   Dr ByBary Castilla  FAMILY HISTORY :   Family History  Problem Relation Age of Onset  . Breast cancer Maternal Aunt   . Breast  cancer Cousin   . Brain cancer Maternal Uncle   . Diabetes Mother   . Hypertension Mother   . Stroke Mother     SOCIAL HISTORY:   Social History   Tobacco Use  . Smoking status: Light Tobacco Smoker    Packs/day: 0.50    Years: 18.00    Pack years: 9.00    Types: Cigarettes  . Smokeless tobacco: Never Used  Substance Use Topics  . Alcohol use: No    Alcohol/week: 0.0 oz  . Drug use: No    ALLERGIES:  is allergic to no known allergies.  MEDICATIONS:  Current Outpatient Medications  Medication Sig Dispense Refill  . abemaciclib (VERZENIO) 50 MG tablet Take 1 tablet (501mby mouth daily for 21 days, then off for 7 days. 21 tablet 3  . anastrozole (ARIMIDEX) 1 MG tablet Take 1 tablet (1 mg total) by mouth daily. 90 tablet 3  . ergocalciferol (VITAMIN D2) 50000 units capsule Take 1 capsule (50,000 Units total) once a week by mouth. 12 capsule 1  . flecainide (TAMBOCOR) 50 MG tablet Take 1 tablet (50 mg total) by mouth 2 (two) times daily. 60 tablet 11  .  KLOR-CON M20 20 MEQ tablet TAKE ONE TABLE BY MOUTH TWICE A DAY 60 tablet 3  . magnesium oxide (MAG-OX) 400 MG tablet Take 1 tablet (400 mg total) by mouth 2 (two) times daily. 60 tablet 6  . oxyCODONE (OXY IR/ROXICODONE) 5 MG immediate release tablet Take 1 tablet (5 mg total) by mouth every 8 (eight) hours as needed for severe pain. 42 tablet 0  . potassium chloride SA (K-DUR,KLOR-CON) 20 MEQ tablet Take 1 tablet (20 mEq total) by mouth daily. 90 tablet 3  . pregabalin (LYRICA) 75 MG capsule Take 1 capsule (75 mg total) by mouth 2 (two) times daily. 60 capsule 3  . azithromycin (ZITHROMAX) 250 MG tablet Take 2 on day 1; and then 1 pill once a day. (Patient not taking: Reported on 11/03/2017) 6 each 0  . diphenoxylate-atropine (LOMOTIL) 2.5-0.025 MG tablet Take 1 tablet by mouth 4 (four) times daily as needed for diarrhea or loose stools. Take it along with immodium (Patient not taking: Reported on 11/03/2017) 60 tablet 0  . feeding  supplement, ENSURE ENLIVE, (ENSURE ENLIVE) LIQD Take 237 mLs by mouth 2 (two) times daily between meals. (Patient not taking: Reported on 11/03/2017) 237 mL 12  . fluticasone (FLONASE) 50 MCG/ACT nasal spray Place 2 sprays into both nostrils daily. (Patient not taking: Reported on 11/03/2017) 16 g 0  . ondansetron (ZOFRAN) 8 MG tablet Take 1 tablet (8 mg total) by mouth every 8 (eight) hours as needed for nausea or vomiting (start 3 days; after chemo). (Patient not taking: Reported on 11/03/2017) 40 tablet 1   No current facility-administered medications for this visit.    Facility-Administered Medications Ordered in Other Visits  Medication Dose Route Frequency Provider Last Rate Last Dose  . 0.9 %  sodium chloride infusion   Intravenous Continuous Pandit, Sandeep, MD      . 0.9 %  sodium chloride infusion   Intravenous Continuous Lloyd Huger, MD 999 mL/hr at 04/24/15 1520    . heparin lock flush 100 unit/mL  500 Units Intravenous Once Berenzon, Dmitriy, MD      . sodium chloride 0.9 % injection 10 mL  10 mL Intravenous PRN [name], MD   10 mL at 04/04/15 1440  . sodium chloride flush (NS) 0.9 % injection 10 mL  10 mL Intravenous PRN Berenzon, Dmitriy, MD      . Tbo-Filgrastim (GRANIX) injection 480 mcg  480 mcg Subcutaneous Once Cammie Sickle, MD        PHYSICAL EXAMINATION: ECOG PERFORMANCE STATUS: 0 - Asymptomatic  BP 108/70 (BP Location: Right Arm, Patient Position: Sitting)   Pulse (!) 108   Temp (!) 97.3 F (36.3 C) (Tympanic)   Wt 159 lb (72.1 kg)   BMI 25.66 kg/m   Filed Weights   11/03/17 1043  Weight: 159 lb (72.1 kg)    GENERAL: Well-nourished well-developed; Alert, no distress and comfortable. Accompanied by her brother.  She is walking with a cane. EYES: no pallor. No jaundice. She is in a wheelchair  OROPHARYNX: No  ulceration; good dentition ; NECK: supple, no masses felt LYMPH:  no palpable lymphadenopathy in the cervical, axillary or inguinal  regions LUNGS: clear to auscultation and  No wheeze or crackles HEART/CVS: regular rate & rhythm and no murmurs; NO bilateral lower extremity edema.  ABDOMEN:abdomen soft, non-tender and normal bowel sounds Musculoskeletal:no cyanosis of digits and no clubbing  PSYCH: alert & oriented x 3 with fluent speech NEURO: no focal motor/sensory deficits- except for  foot drop on the right side.   LABORATORY DATA:  I have reviewed the data as listed    Component Value Date/Time   NA 138 11/03/2017 1027   NA 135 03/04/2017 0918   NA 135 01/08/2015 0850   K 4.2 11/03/2017 1027   K 3.7 01/08/2015 0850   CL 103 11/03/2017 1027   CL 101 01/08/2015 0850   CO2 27 11/03/2017 1027   CO2 26 01/08/2015 0850   GLUCOSE 59 (L) 11/03/2017 1027   GLUCOSE 161 (H) 01/08/2015 0850   BUN 15 11/03/2017 1027   BUN 8 03/04/2017 0918   BUN 17 01/08/2015 0850   CREATININE 0.69 11/03/2017 1027   CREATININE 0.82 01/22/2015 1559   CALCIUM 9.0 11/03/2017 1027   CALCIUM 9.3 01/08/2015 0850   PROT 7.6 11/03/2017 1027   PROT 6.4 03/04/2017 0918   PROT 6.8 01/22/2015 1559   ALBUMIN 3.6 11/03/2017 1027   ALBUMIN 2.5 (L) 03/04/2017 0918   ALBUMIN 3.9 01/22/2015 1559   AST 43 (H) 11/03/2017 1027   AST 34 01/22/2015 1559   ALT 32 11/03/2017 1027   ALT 42 01/22/2015 1559   ALKPHOS 151 (H) 11/03/2017 1027   ALKPHOS 157 (H) 01/22/2015 1559   BILITOT 0.9 11/03/2017 1027   BILITOT 6.2 (H) 03/04/2017 0918   BILITOT 0.2 (L) 01/22/2015 1559   GFRNONAA >60 11/03/2017 1027   GFRNONAA >60 01/22/2015 1559   GFRAA >60 11/03/2017 1027   GFRAA >60 01/22/2015 1559    No results found for: SPEP, UPEP  Lab Results  Component Value Date   WBC 1.9 (L) 11/03/2017   NEUTROABS 0.6 (L) 11/03/2017   HGB 13.7 11/03/2017   HCT 41.2 11/03/2017   MCV 95.1 11/03/2017   PLT 138 (L) 11/03/2017      Chemistry      Component Value Date/Time   NA 138 11/03/2017 1027   NA 135 03/04/2017 0918   NA 135 01/08/2015 0850   K 4.2  11/03/2017 1027   K 3.7 01/08/2015 0850   CL 103 11/03/2017 1027   CL 101 01/08/2015 0850   CO2 27 11/03/2017 1027   CO2 26 01/08/2015 0850   BUN 15 11/03/2017 1027   BUN 8 03/04/2017 0918   BUN 17 01/08/2015 0850   CREATININE 0.69 11/03/2017 1027   CREATININE 0.82 01/22/2015 1559      Component Value Date/Time   CALCIUM 9.0 11/03/2017 1027   CALCIUM 9.3 01/08/2015 0850   ALKPHOS 151 (H) 11/03/2017 1027   ALKPHOS 157 (H) 01/22/2015 1559   AST 43 (H) 11/03/2017 1027   AST 34 01/22/2015 1559   ALT 32 11/03/2017 1027   ALT 42 01/22/2015 1559   BILITOT 0.9 11/03/2017 1027   BILITOT 6.2 (H) 03/04/2017 0918   BILITOT 0.2 (L) 01/22/2015 1559            LABORATORY DATA:  I have reviewed the data as listed    Component Value Date/Time   NA 138 11/03/2017 1027   NA 135 03/04/2017 0918   NA 135 01/08/2015 0850   K 4.2 11/03/2017 1027   K 3.7 01/08/2015 0850   CL 103 11/03/2017 1027   CL 101 01/08/2015 0850   CO2 27 11/03/2017 1027   CO2 26 01/08/2015 0850   GLUCOSE 59 (L) 11/03/2017 1027   GLUCOSE 161 (H) 01/08/2015 0850   BUN 15 11/03/2017 1027   BUN 8 03/04/2017 0918   BUN 17 01/08/2015 0850   CREATININE 0.69 11/03/2017 1027  CREATININE 0.82 01/22/2015 1559   CALCIUM 9.0 11/03/2017 1027   CALCIUM 9.3 01/08/2015 0850   PROT 7.6 11/03/2017 1027   PROT 6.4 03/04/2017 0918   PROT 6.8 01/22/2015 1559   ALBUMIN 3.6 11/03/2017 1027   ALBUMIN 2.5 (L) 03/04/2017 0918   ALBUMIN 3.9 01/22/2015 1559   AST 43 (H) 11/03/2017 1027   AST 34 01/22/2015 1559   ALT 32 11/03/2017 1027   ALT 42 01/22/2015 1559   ALKPHOS 151 (H) 11/03/2017 1027   ALKPHOS 157 (H) 01/22/2015 1559   BILITOT 0.9 11/03/2017 1027   BILITOT 6.2 (H) 03/04/2017 0918   BILITOT 0.2 (L) 01/22/2015 1559   GFRNONAA >60 11/03/2017 1027   GFRNONAA >60 01/22/2015 1559   GFRAA >60 11/03/2017 1027   GFRAA >60 01/22/2015 1559    No results found for: SPEP, UPEP  Lab Results  Component Value Date   WBC  1.9 (L) 11/03/2017   NEUTROABS 0.6 (L) 11/03/2017   HGB 13.7 11/03/2017   HCT 41.2 11/03/2017   MCV 95.1 11/03/2017   PLT 138 (L) 11/03/2017      Chemistry      Component Value Date/Time   NA 138 11/03/2017 1027   NA 135 03/04/2017 0918   NA 135 01/08/2015 0850   K 4.2 11/03/2017 1027   K 3.7 01/08/2015 0850   CL 103 11/03/2017 1027   CL 101 01/08/2015 0850   CO2 27 11/03/2017 1027   CO2 26 01/08/2015 0850   BUN 15 11/03/2017 1027   BUN 8 03/04/2017 0918   BUN 17 01/08/2015 0850   CREATININE 0.69 11/03/2017 1027   CREATININE 0.82 01/22/2015 1559      Component Value Date/Time   CALCIUM 9.0 11/03/2017 1027   CALCIUM 9.3 01/08/2015 0850   ALKPHOS 151 (H) 11/03/2017 1027   ALKPHOS 157 (H) 01/22/2015 1559   AST 43 (H) 11/03/2017 1027   AST 34 01/22/2015 1559   ALT 32 11/03/2017 1027   ALT 42 01/22/2015 1559   BILITOT 0.9 11/03/2017 1027   BILITOT 6.2 (H) 03/04/2017 0918   BILITOT 0.2 (L) 01/22/2015 1559     IMPRESSION: Normal left ventricular wall motion with estimated ejection fraction of 65%.   Electronically Signed   By: Marijo Sanes M.D.   On: 07/27/2016 09:04  RADIOGRAPHIC STUDIES: I have personally reviewed the radiological images as listed and agreed with the findings in the report. No results found.  IMPRESSION: 1. 4 centimeter focus of ground-glass attenuation in the anterior right upper lobe is associated with new interstitial opacity in the left apex. These changes may be infectious/inflammatory. Attention on follow-up recommended. 2. New sclerotic focus in the inferior endplate of T4, may be a Schmorl's node but is new since prior chest CT. Attention on follow-up recommended. The sclerotic lesion in the T12 vertebral body is stable. 3. Stable to slight decrease and liver metastases. 4.  Aortic Atherosclerois (ICD10-170.0)   Electronically Signed   By: Misty Stanley M.D.   On: 10/05/2017 15:47 ASSESSMENT & PLAN:  Carcinoma of  upper-outer quadrant of left breast in female, estrogen receptor positive ([place]) # RECURRENT Metastatic breast cancer- ER/PR positive HER-2/neu negative. CT jan 8th - shows partial response/stable disease in liver; ? New 4 cm ground glass opacity in RUL [see discussion below]  # Currently on palliative Faslodex + Abemaciclib  at 18m/day;Tolerating well-ANC today is 0.6; continue recommend taking abema 50 mg/day.   # Brain mets- s/p RT [finished Feb 28th]; symptomatic improvement. Sep  25th MRI- significant improvement. Recommend MRI brain prior to next visit.    # Tachycardia/history of SVT- stable on flecainide.    # PN G-1/ improving secondary to chemotherapy. Continue Lyrica; Continue oxycodone once a day as needed.   # faslodex today; follow up in 4 weeks/labs/faslodex; MRI brain.   Orders Placed This Encounter  Procedures  . MR Brain W Wo Contrast    Standing Status:   Future    Standing Expiration Date:   11/03/2018    Order Specific Question:   If indicated for the ordered procedure, I authorize the administration of contrast media per Radiology protocol    Answer:   Yes    Order Specific Question:   What is the patient's sedation requirement?    Answer:   No Sedation    Order Specific Question:   Does the patient have a pacemaker or implanted devices?    Answer:   No    Order Specific Question:   Radiology Contrast Protocol - do NOT remove file path    Answer:   [email or web address]    Order Specific Question:   Preferred imaging location?    Answer:   [facility] (table limit-300lbs)  . CBC with Differential/Platelet    Standing Status:   Future    Standing Expiration Date:   11/03/2018  . Comprehensive metabolic panel    Standing Status:   Future    Standing Expiration Date:   11/03/2018  . Cancer antigen 27.29    Standing Status:   Future    Standing Expiration Date:   11/03/2018   All questions were answered. The patient knows to call the clinic  with any problems, questions or concerns.      Cammie Sickle, MD 11/03/2017 12:49 PM

## 2017-11-04 LAB — CANCER ANTIGEN 27.29: CAN 27.29: 15.9 U/mL (ref 0.0–38.6)

## 2017-11-04 IMAGING — MR MR 3D RECON AT SCANNER
17 of 18 series · 44 of 48 positions shown · IV contrast (15 ML MULTIHANCE)
Comparison: 12/04/2016 abdominal CTA.  Chest CT of 01/12/2017.

CLINICAL DATA: Left-sided breast cancer.  Elevated liver markers.

EXAM:
MRI ABDOMEN WITHOUT AND WITH CONTRAST (INCLUDING MRCP)
TECHNIQUE: Multiplanar multisequence MR imaging of the abdomen was performed
both before and after the administration of intravenous contrast.
Heavily T2-weighted images of the biliary and pancreatic ducts were
obtained, and three-dimensional MRCP images were rendered by post
processing.
CONTRAST:  15mL MULTIHANCE GADOBENATE DIMEGLUMINE 529 MG/ML IV SOLN

[Series 2: bSSFP · coronal · 6.0mm · 0.74mm/px · 2 of 31 slices shown]
[im 1/31]
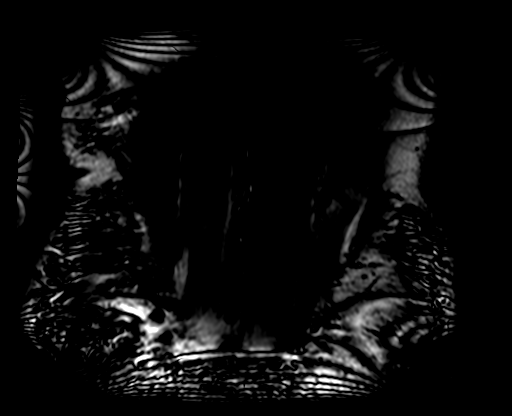
[im 31/31]
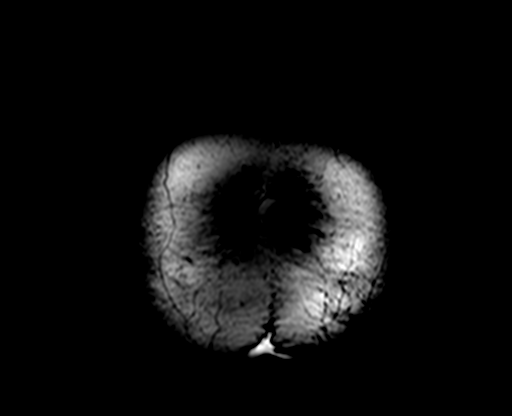

[Series 3: axial ssfse / · axial · 6.5mm · 1.19mm/px · z∈[-75,+175]mm · 2 of 33 slices shown]
[im 1/33]
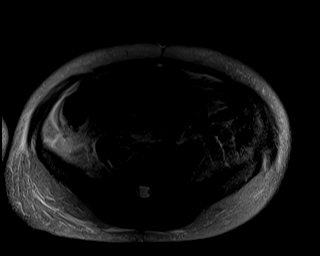
[im 33/33]
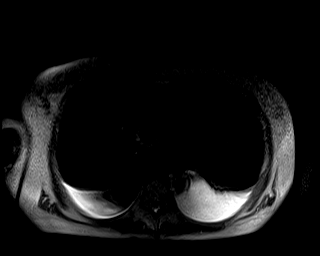

[Series 4: T2 fat-sat · axial · 6.5mm · 1.48mm/px · z∈[-75,+175]mm · 2 of 33 slices shown]
[im 1/33]
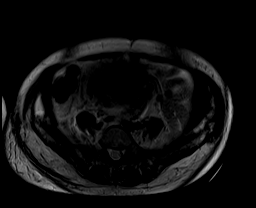
[im 33/33]
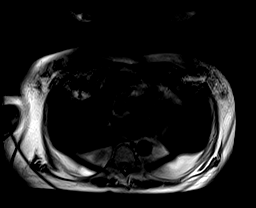

[Series 5: T1 · axial · 6.5mm · 0.78mm/px · z∈[-75,+175]mm · 3 of 66 slices shown]
[im 1/66]
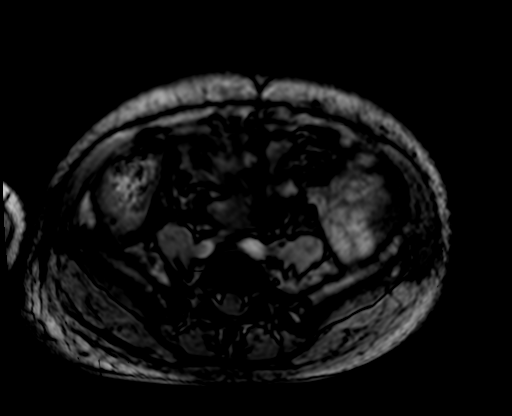
[im 33/66]
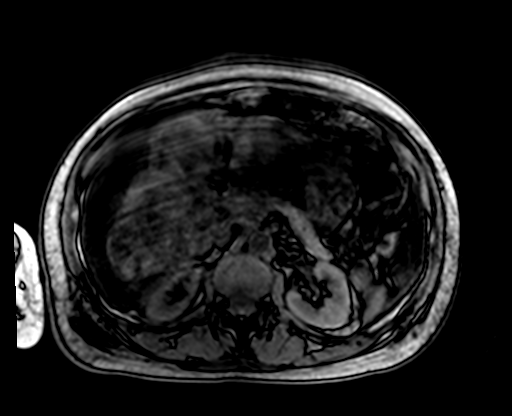
[im 66/66]
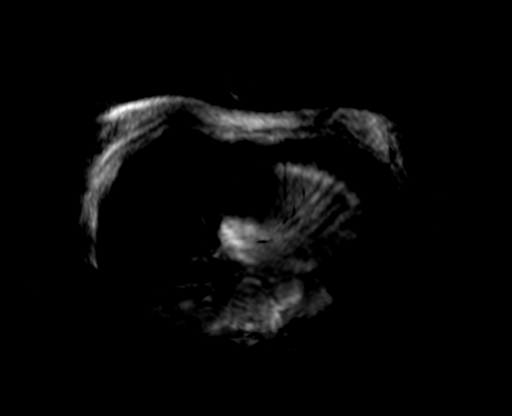

[Series 6: axial dynamic pre · axial · non-contrast · 4.0mm · 1.19mm/px · z∈[-93,+191]mm · 3 of 72 slices shown]
[im 1/72]
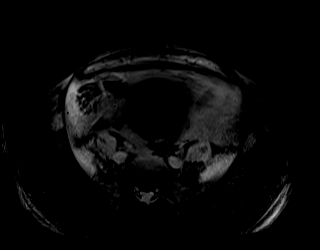
[im 36/72]
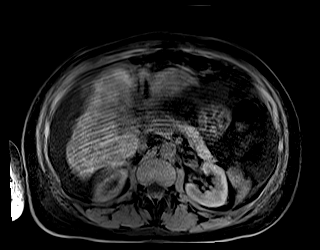
[im 72/72]
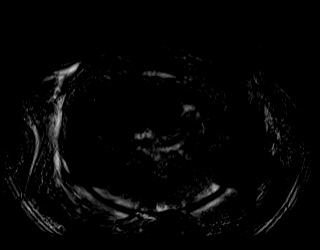

[Series 7: DWI · axial · 6.5mm · 2.00mm/px · z∈[-79,+179]mm · 4 of 102 slices shown]
[im 1/102]
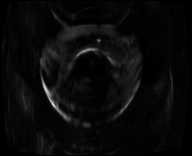
[im 34/102]
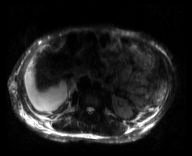
[im 68/102]
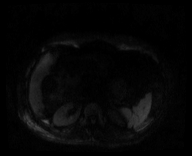
[im 102/102]
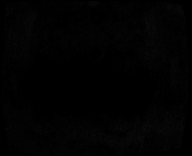

[Series 8: ax dwi_adc · axial · 6.5mm · 2.00mm/px · 1 of 34 slices shown]
[im 1/34]
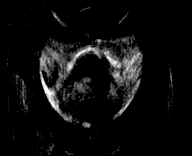

[Series 11: cor thins · coronal · 3.5mm · 0.94mm/px · 1 of 14 slices shown]
[im 1/14]
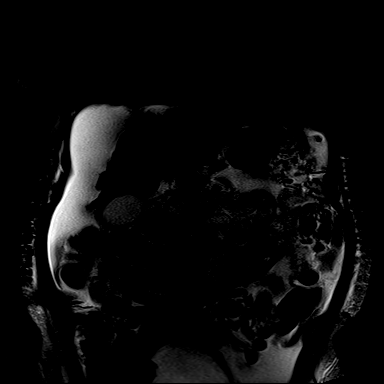

[Series 13: T2 · coronal · 1.5mm · 0.99mm/px · 2 of 44 slices shown]
[im 1/44]
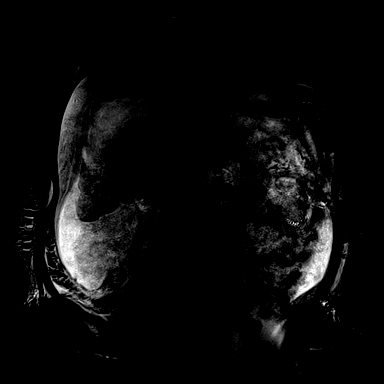
[im 44/44]
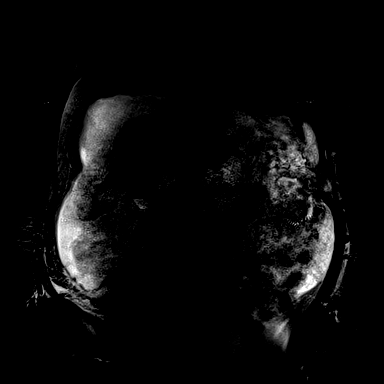

[Series 17: axial dynamic post · axial · 4.0mm · 1.19mm/px · z∈[-93,+191]mm · 3 of 72 slices shown (1 of 6)]
[im 1/72]
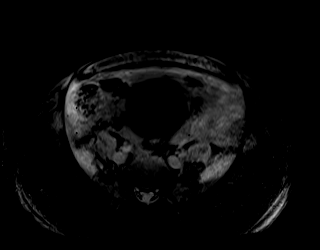
[im 36/72]
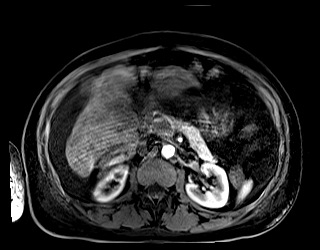
[im 72/72]
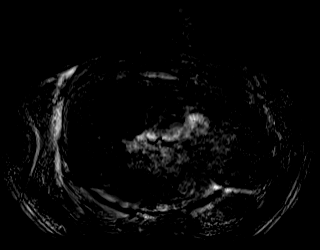

[Series 18: axial dynamic post · axial · 4.0mm · 1.19mm/px · z∈[-93,+191]mm · 3 of 72 slices shown (2 of 6)]
[im 1/72]
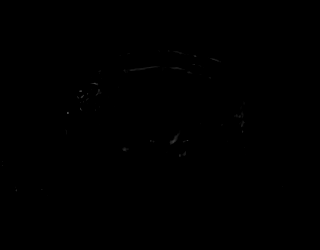
[im 36/72]
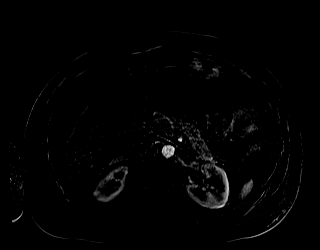
[im 72/72]
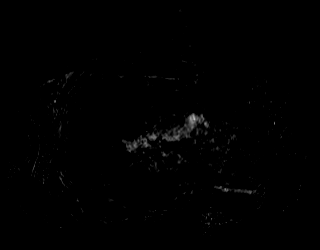

[Series 19: axial dynamic post · axial · 4.0mm · 1.19mm/px · z∈[-93,+191]mm · 3 of 72 slices shown (3 of 6)]
[im 1/72]
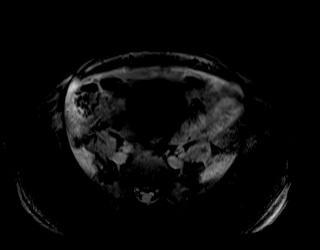
[im 36/72]
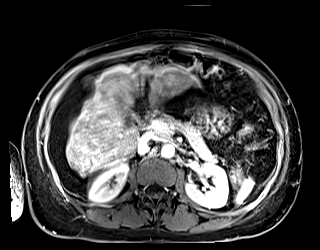
[im 72/72]
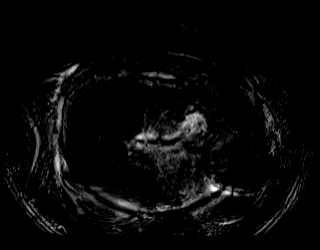

[Series 20: axial dynamic post · axial · 4.0mm · 1.19mm/px · z∈[-93,+191]mm · 3 of 72 slices shown (4 of 6)]
[im 1/72]
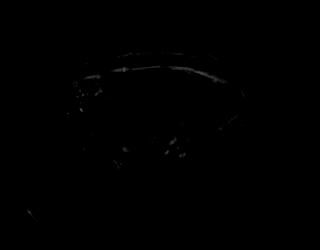
[im 36/72]
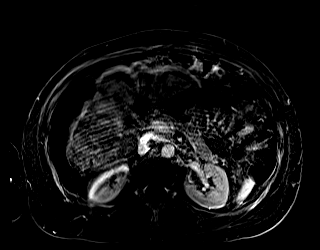
[im 72/72]
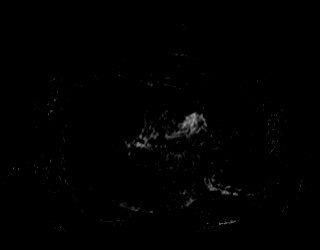

[Series 21: axial dynamic post · axial · 4.0mm · 1.19mm/px · z∈[-93,+191]mm · 3 of 72 slices shown (5 of 6)]
[im 1/72]
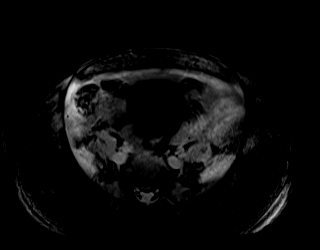
[im 36/72]
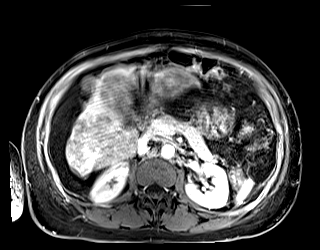
[im 72/72]
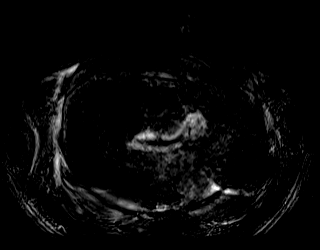

[Series 22: axial dynamic post · axial · 4.0mm · 1.19mm/px · z∈[-93,+191]mm · 3 of 72 slices shown (6 of 6)]
[im 1/72]
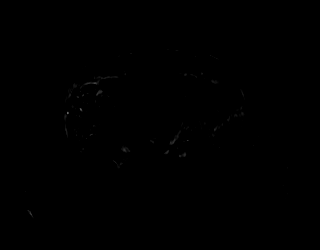
[im 36/72]
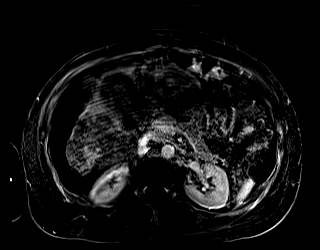
[im 72/72]
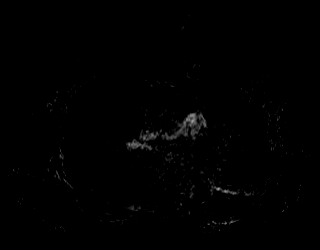

[Series 24: axial dynamic delayed · axial · 4.0mm · 1.19mm/px · z∈[-93,+191]mm · 3 of 72 slices shown]
[im 1/72]
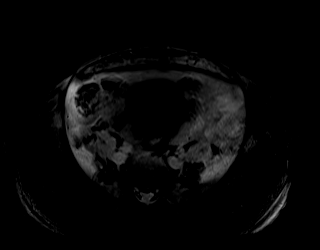
[im 36/72]
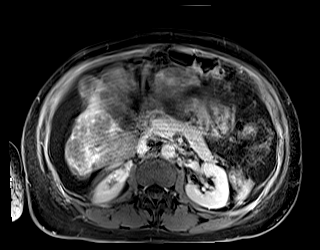
[im 72/72]
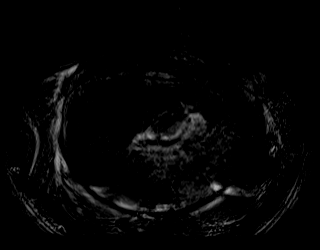

[Series 25: axial dynamic delayed_sub · axial · 4.0mm · 1.19mm/px · z∈[-93,+191]mm · 3 of 72 slices shown]
[im 1/72]
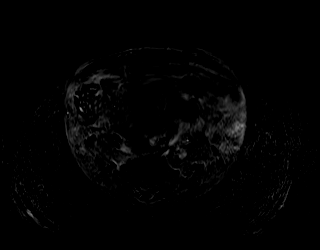
[im 36/72]
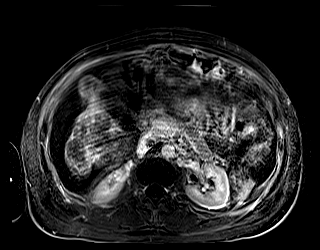
[im 72/72]
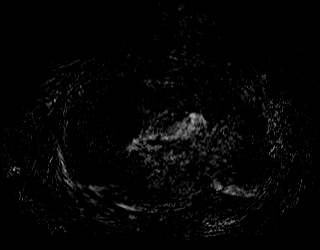

[44 of 48 positions shown; findings below may reference images not displayed]

FINDINGS: Moderate to marked motion degradation throughout.

Lower chest: Small right and small to moderate left-sided pleural
effusion, new since the prior exam. Normal heart size.

Hepatobiliary: "Pseudo cirrhosis" appearance of the liver is likely
related to widespread metastatic disease and treatment effects.
Difficult to quantify the extent of presumed diffuse hepatic
metastasis, given motion and cross modality comparison. Hepatic dome
lesion measures 3.1 cm on image 17/ series 19. Felt to be grossly
similar to 2.9 cm on 12/04/2016. Normal gallbladder. No biliary duct
dilatation. Favor artifactual foci of T2 hypointensity within the
common duct on image 5/series 11.

Pancreas:  Normal, without mass or ductal dilatation.

Spleen:  Normal in size, without focal abnormality.

Adrenals/Urinary Tract: Normal adrenal glands. Normal kidneys,
without hydronephrosis.

Stomach/Bowel: Normal stomach and abdominal bowel loops.

Vascular/Lymphatic: Aortic and branch vessel atherosclerosis. IVC
filter. No retroperitoneal or retrocrural adenopathy.

Other: Interval development of moderate to marked abdominal ascites.
Peritoneal hyperenhancement is most apparent on image 21/series 25.

Musculoskeletal: No gross focal osseous lesion identified.
IMPRESSION: 1. Moderate to markedly motion degraded exam.
2. Suboptimal evaluation of the liver, with grossly similar diffuse
metastatic disease.
3. No biliary duct dilatation or convincing evidence of
choledocholithiasis.
4. Development of moderate to large volume ascites, likely malignant
given peritoneal enhancement.
5. New bilateral pleural effusions, suggesting fluid overload.
6.  Aortic Atherosclerosis (16AEA-BB3.3).

## 2017-11-12 ENCOUNTER — Ambulatory Visit (INDEPENDENT_AMBULATORY_CARE_PROVIDER_SITE_OTHER): Payer: BLUE CROSS/BLUE SHIELD | Admitting: Family Medicine

## 2017-11-12 ENCOUNTER — Encounter: Payer: Self-pay | Admitting: Family Medicine

## 2017-11-12 VITALS — BP 104/68 | HR 100 | Temp 98.7°F | Resp 16 | Wt 159.1 lb

## 2017-11-12 DIAGNOSIS — J069 Acute upper respiratory infection, unspecified: Secondary | ICD-10-CM | POA: Diagnosis not present

## 2017-11-12 DIAGNOSIS — R058 Other specified cough: Secondary | ICD-10-CM

## 2017-11-12 DIAGNOSIS — R05 Cough: Secondary | ICD-10-CM

## 2017-11-12 LAB — POCT INFLUENZA A/B
INFLUENZA A, POC: NEGATIVE
Influenza B, POC: NEGATIVE

## 2017-11-12 MED ORDER — BENZONATATE 100 MG PO CAPS: 100.0000 mg | ORAL_CAPSULE | Freq: Two times a day (BID) | ORAL | 0 refills | Status: DC | PRN

## 2017-11-12 MED ORDER — FLUTICASONE PROPIONATE 50 MCG/ACT NA SUSP: 2.0000 | Freq: Every day | NASAL | 0 refills | Status: DC

## 2017-11-12 MED ORDER — AMOXICILLIN-POT CLAVULANATE 875-125 MG PO TABS: 1.0000 | ORAL_TABLET | Freq: Two times a day (BID) | ORAL | 0 refills | Status: DC

## 2017-11-12 NOTE — Progress Notes (Signed)
Name: Sarah Horne   MRN: [id number]    DOB: 1956-07-27   Date:11/12/2017       Progress Note  Subjective  Chief Complaint  Chief Complaint  Patient presents with  . Cough    onset tuesday; pt denies any fever and bosy aches/ chills   . Sore Throat    mild onset tuesday; pt denies fever  . Nasal Congestion    onset tuesday     Cough  This is a new problem. The current episode started in the past 7 days (4 days ago). The cough is productive of sputum. Associated symptoms include headaches, nasal congestion, postnasal drip and a sore throat. Pertinent negatives include no chills, fever or shortness of breath. She has tried nothing for the symptoms.     Past Medical History:  Diagnosis Date  . Chemotherapy-induced peripheral neuropathy ([place])   . Epistaxis    a. 11/2016 in setting of asa/plavix-->silver nitrate cauterization.  . GI bleed    a. 11/2016 Admission w/ GIB and hypovolemic shock req 3u PRBC's;  b. 11/2016 ECG: gastritis & nonbleeding peptic ulcer; c. 11/2016 Conlonoscopy: rectal and sigmoid colonic ulcers.  . Heart attack ([place])    a. 1998 Cath @ [facility]: reportedly no intervention required.  Marland Kitchen Herceptin-induced cardiomyopathy ([place])    a. In the setting of Herceptin Rx for breast cancer (initiated 12/2014); b. 03/2015 MUGA EF 64%; b. 08/2015 MUGA: EF 51%; c. 10/2015 MUGA: EF 44%; d. 11/2015 Echo: EF 45-50%; e. 01/2016 MUGA: EF 60%; f. 06/2016 MUGA EF 65%; g. 10/2016 MUGA: EF 61%;  h. 12/2016 Echo: EF 55-60%, gr1 DD.  Marland Kitchen Hyperlipidemia   . Hypertension   . Neuropathy   . Possible PAD (peripheral artery disease) ([place])    a. 11/2016 LE cyanosis and weak pulses-->CTA w/o significant Ao-BiFem dzs. ? distal dzs-->ASA/Plavix initiated by vascular surgery.  Marland Kitchen PSVT (paroxysmal supraventricular tachycardia) ([place])    a. Dx 11/2016.  . Pulmonary embolism ([place])    a. 12/2016 CTA Chest: small nonocclusive PE in inferior segment of the Left lingula, somewhat eccentric filling defect suggesting chronic  rather than acute embolic event; b. 10/5496 LE U/S:  No DVT; c. 12/2016 Echo: Nl RV fxn, nl PASP.  Marland Kitchen Recurrent Metastatic breast cancer ([place])    a. Dx 2016: Stage II, ER positive, PR positive, HER-2/neu overexpressing of the left breast-->chemo/radiation; b. 10/2016 CT Abd/pelvis: diffuse liver mets, ill defined sclerotic bone lesions-T12;  c. 10/2016 MRI brain: metastatic lesion along L temporal lobe (19x3m) w/ extensive surrounding edema & 522mmidline shift to right.  . Sinus tachycardia     Past Surgical History:  Procedure Laterality Date  . BREAST BIOPSY Left 2016   Positive  . BREAST LUMPECTOMY WITH SENTINEL LYMPH NODE BIOPSY Left 05/23/2015   Procedure: LEFT BREAST WIDE EXCISION WITH AXILLARY DISSECTION, MASTOPLASTY ;  Surgeon: JeRobert BellowMD;  Location: [facility] ORS;  Service: General;  Laterality: Left;  . BREAST SURGERY Left 12/18/14   breast biopsy/INVASIVE DUCTAL CARCINOMA OF BREAST, NOTTINGHAM GRADE 2.  . Marland KitchenREAST SURGERY  05/23/2015.   Wide excision/mastoplasty, axillary dissection. No residual invasive cancer, positive for residual DCIS. 0/2 nodes identified on axillary dissection. (no SLN by technetium or methylene blue)  . CARDIAC CATHETERIZATION    . COLONOSCOPY WITH PROPOFOL N/A 12/10/2016   Procedure: COLONOSCOPY WITH PROPOFOL;  Surgeon: DaLucilla LameMD;  Location: [facility] ENDOSCOPY;  Service: Endoscopy;  Laterality: N/A;  . COLONOSCOPY WITH PROPOFOL N/A 02/13/2017   Procedure: COLONOSCOPY WITH  PROPOFOL;  Surgeon: Lucilla Lame, MD;  Location[facility] ENDOSCOPY;  Service: Endoscopy;  Laterality: N/A;  . ESOPHAGOGASTRODUODENOSCOPY (EGD) WITH PROPOFOL N/A 12/08/2016   Procedure: ESOPHAGOGASTRODUODENOSCOPY (EGD) WITH PROPOFOL;  Surgeon: Lucilla Lame, MD;  Location: [facility] ENDOSCOPY;  Service: Endoscopy;  Laterality: N/A;  . IVC FILTER INSERTION N/A 02/15/2017   Procedure: IVC Filter Insertion;  Surgeon: Algernon Huxley, MD;  Location: [name] [facility];  Service: Cardiovascular;  Laterality:  N/A;  . PORTACATH PLACEMENT Right 12-31-14   Dr Bary Castilla    Family History  Problem Relation Age of Onset  . Breast cancer Maternal Aunt   . Breast cancer Cousin   . Brain cancer Maternal Uncle   . Diabetes Mother   . Hypertension Mother   . Stroke Mother     Social History   Socioeconomic History  . Marital status: Divorced    Spouse name: Not on file  . Number of children: Not on file  . Years of education: Not on file  . Highest education level: Not on file  Social Needs  . Financial resource strain: Not on file  . Food insecurity - worry: Not on file  . Food insecurity - inability: Not on file  . Transportation needs - medical: Not on file  . Transportation needs - non-medical: Not on file  Occupational History  . Not on file  Tobacco Use  . Smoking status: Light Tobacco Smoker    Packs/day: 0.50    Years: 18.00    Pack years: 9.00    Types: Cigarettes  . Smokeless tobacco: Never Used  Substance and Sexual Activity  . Alcohol use: No    Alcohol/week: 0.0 oz  . Drug use: No  . Sexual activity: Not on file  Other Topics Concern  . Not on file  Social History Narrative   Lives in [place] by herself.     Current Outpatient Medications:  .  abemaciclib (VERZENIO) 50 MG tablet, Take 1 tablet (47m) by mouth daily for 21 days, then off for 7 days., Disp: 21 tablet, Rfl: 3 .  anastrozole (ARIMIDEX) 1 MG tablet, Take 1 tablet (1 mg total) by mouth daily., Disp: 90 tablet, Rfl: 3 .  ergocalciferol (VITAMIN D2) 50000 units capsule, Take 1 capsule (50,000 Units total) once a week by mouth., Disp: 12 capsule, Rfl: 1 .  flecainide (TAMBOCOR) 50 MG tablet, Take 1 tablet (50 mg total) by mouth 2 (two) times daily., Disp: 60 tablet, Rfl: 11 .  KLOR-CON M20 20 MEQ tablet, TAKE ONE TABLE BY MOUTH TWICE A DAY, Disp: 60 tablet, Rfl: 3 .  magnesium oxide (MAG-OX) 400 MG tablet, Take 1 tablet (400 mg total) by mouth 2 (two) times daily., Disp: 60 tablet, Rfl: 6 .  oxyCODONE (OXY  IR/ROXICODONE) 5 MG immediate release tablet, Take 1 tablet (5 mg total) by mouth every 8 (eight) hours as needed for severe pain., Disp: 42 tablet, Rfl: 0 .  potassium chloride SA (K-DUR,KLOR-CON) 20 MEQ tablet, Take 1 tablet (20 mEq total) by mouth daily., Disp: 90 tablet, Rfl: 3 .  pregabalin (LYRICA) 75 MG capsule, Take 1 capsule (75 mg total) by mouth 2 (two) times daily., Disp: 60 capsule, Rfl: 3 .  azithromycin (ZITHROMAX) 250 MG tablet, Take 2 on day 1; and then 1 pill once a day. (Patient not taking: Reported on 11/03/2017), Disp: 6 each, Rfl: 0 .  diphenoxylate-atropine (LOMOTIL) 2.5-0.025 MG tablet, Take 1 tablet by mouth 4 (four) times daily as needed for diarrhea or  loose stools. Take it along with immodium (Patient not taking: Reported on 11/03/2017), Disp: 60 tablet, Rfl: 0 .  feeding supplement, ENSURE ENLIVE, (ENSURE ENLIVE) LIQD, Take 237 mLs by mouth 2 (two) times daily between meals. (Patient not taking: Reported on 11/03/2017), Disp: 237 mL, Rfl: 12 .  fluticasone (FLONASE) 50 MCG/ACT nasal spray, Place 2 sprays into both nostrils daily. (Patient not taking: Reported on 11/03/2017), Disp: 16 g, Rfl: 0 .  ondansetron (ZOFRAN) 8 MG tablet, Take 1 tablet (8 mg total) by mouth every 8 (eight) hours as needed for nausea or vomiting (start 3 days; after chemo). (Patient not taking: Reported on 11/03/2017), Disp: 40 tablet, Rfl: 1 No current facility-administered medications for this visit.   Facility-Administered Medications Ordered in Other Visits:  .  0.9 %  sodium chloride infusion, , Intravenous, Continuous, Pandit, Sandeep, MD .  0.9 %  sodium chloride infusion, , Intravenous, Continuous, Finnegan, Kathlene November, MD, Last Rate: 999 mL/hr at 04/24/15 1520 .  heparin lock flush 100 unit/mL, 500 Units, Intravenous, Once, Berenzon, Dmitriy, MD .  sodium chloride 0.9 % injection 10 mL, 10 mL, Intravenous, PRN, Ma Hillock, Sandeep, MD, 10 mL at 04/04/15 1440 .  sodium chloride flush (NS) 0.9 % injection  10 mL, 10 mL, Intravenous, PRN, Berenzon, Dmitriy, MD .  Tbo-Filgrastim [facility]) injection 480 mcg, 480 mcg, Subcutaneous, Once, Cammie Sickle, MD  Allergies  Allergen Reactions  . No Known Allergies      Review of Systems  Constitutional: Negative for chills and fever.  HENT: Positive for postnasal drip and sore throat.   Respiratory: Positive for cough. Negative for shortness of breath.   Neurological: Positive for headaches.      Objective  Vitals:   11/12/17 1158  BP: 104/68  Pulse: 100  Resp: 16  Temp: 98.7 F (37.1 C)  TempSrc: Oral  SpO2: 96%  Weight: 159 lb 1.6 oz (72.2 kg)    Physical Exam  Constitutional: She is well-developed, well-nourished, and in no distress.  HENT:  Head: Normocephalic and atraumatic.  Mouth/Throat: No posterior oropharyngeal erythema.  Nasal mucosal inflammation, turbinate hypertrophy.  Cardiovascular: Regular rhythm, S1 normal, S2 normal and normal heart sounds. Tachycardia present.  No murmur heard. Pulmonary/Chest: Effort normal and breath sounds normal. No respiratory distress. She has no wheezes. She has no rhonchi.       Assessment & Plan  1. Productive cough Negative for influenza - POCT Influenza A/B  2. URI with cough and congestion Started on Augmentin to be taken if symptoms do not improve within the next 3-4 days. Otherwise, take Tessalon for cough and Flonase for nasal sinus congestion - amoxicillin-clavulanate (AUGMENTIN) 875-125 MG tablet; Take 1 tablet by mouth 2 (two) times daily.  Dispense: 20 tablet; Refill: 0 - benzonatate (TESSALON) 100 MG capsule; Take 1 capsule (100 mg total) by mouth 2 (two) times daily as needed for cough.  Dispense: 20 capsule; Refill: 0 - fluticasone (FLONASE) 50 MCG/ACT nasal spray; Place 2 sprays into both nostrils daily.  Dispense: 16 g; Refill: 0   Azlee Monforte [facility] 11/12/2017 12:07 PM

## 2017-11-17 ENCOUNTER — Other Ambulatory Visit: Payer: Self-pay | Admitting: Internal Medicine

## 2017-11-17 DIAGNOSIS — Z170 Estrogen receptor positive status [ER+]: Principal | ICD-10-CM

## 2017-11-17 DIAGNOSIS — C50412 Malignant neoplasm of upper-outer quadrant of left female breast: Secondary | ICD-10-CM

## 2017-11-18 ENCOUNTER — Ambulatory Visit: Payer: BLUE CROSS/BLUE SHIELD | Admitting: Cardiovascular Disease

## 2017-11-19 ENCOUNTER — Ambulatory Visit: Payer: Self-pay | Admitting: Cardiovascular Disease

## 2017-11-26 ENCOUNTER — Other Ambulatory Visit: Payer: Self-pay | Admitting: Internal Medicine

## 2017-11-26 DIAGNOSIS — Z170 Estrogen receptor positive status [ER+]: Principal | ICD-10-CM

## 2017-11-26 DIAGNOSIS — C50412 Malignant neoplasm of upper-outer quadrant of left female breast: Secondary | ICD-10-CM

## 2017-11-26 NOTE — Telephone Encounter (Signed)
   Ref Range & Units 3wk ago  Potassium 3.5 - 5.1 mmol/L 4.2

## 2017-11-29 ENCOUNTER — Ambulatory Visit: Admission: RE | Admit: 2017-11-29 | Payer: BLUE CROSS/BLUE SHIELD | Source: Ambulatory Visit

## 2017-12-01 ENCOUNTER — Inpatient Hospital Stay: Payer: BLUE CROSS/BLUE SHIELD | Attending: Internal Medicine

## 2017-12-01 ENCOUNTER — Encounter: Payer: Self-pay | Admitting: Internal Medicine

## 2017-12-01 ENCOUNTER — Inpatient Hospital Stay (HOSPITAL_BASED_OUTPATIENT_CLINIC_OR_DEPARTMENT_OTHER): Payer: BLUE CROSS/BLUE SHIELD | Admitting: Internal Medicine

## 2017-12-01 ENCOUNTER — Inpatient Hospital Stay: Payer: BLUE CROSS/BLUE SHIELD

## 2017-12-01 ENCOUNTER — Other Ambulatory Visit: Payer: Self-pay

## 2017-12-01 VITALS — BP 124/89 | HR 86 | Temp 97.9°F | Resp 20 | Ht 66.0 in | Wt 164.4 lb

## 2017-12-01 DIAGNOSIS — C50412 Malignant neoplasm of upper-outer quadrant of left female breast: Secondary | ICD-10-CM

## 2017-12-01 DIAGNOSIS — Z170 Estrogen receptor positive status [ER+]: Secondary | ICD-10-CM

## 2017-12-01 DIAGNOSIS — F17210 Nicotine dependence, cigarettes, uncomplicated: Secondary | ICD-10-CM | POA: Insufficient documentation

## 2017-12-01 DIAGNOSIS — E860 Dehydration: Secondary | ICD-10-CM

## 2017-12-01 DIAGNOSIS — C787 Secondary malignant neoplasm of liver and intrahepatic bile duct: Secondary | ICD-10-CM | POA: Insufficient documentation

## 2017-12-01 DIAGNOSIS — Z86711 Personal history of pulmonary embolism: Secondary | ICD-10-CM | POA: Insufficient documentation

## 2017-12-01 DIAGNOSIS — Z79899 Other long term (current) drug therapy: Secondary | ICD-10-CM

## 2017-12-01 DIAGNOSIS — Z853 Personal history of malignant neoplasm of breast: Secondary | ICD-10-CM | POA: Insufficient documentation

## 2017-12-01 DIAGNOSIS — I10 Essential (primary) hypertension: Secondary | ICD-10-CM | POA: Diagnosis not present

## 2017-12-01 DIAGNOSIS — E785 Hyperlipidemia, unspecified: Secondary | ICD-10-CM | POA: Diagnosis not present

## 2017-12-01 DIAGNOSIS — I252 Old myocardial infarction: Secondary | ICD-10-CM | POA: Diagnosis not present

## 2017-12-01 DIAGNOSIS — Z923 Personal history of irradiation: Secondary | ICD-10-CM | POA: Insufficient documentation

## 2017-12-01 DIAGNOSIS — M21371 Foot drop, right foot: Secondary | ICD-10-CM

## 2017-12-01 DIAGNOSIS — Z9221 Personal history of antineoplastic chemotherapy: Secondary | ICD-10-CM | POA: Insufficient documentation

## 2017-12-01 DIAGNOSIS — Z79818 Long term (current) use of other agents affecting estrogen receptors and estrogen levels: Secondary | ICD-10-CM | POA: Insufficient documentation

## 2017-12-01 DIAGNOSIS — C7931 Secondary malignant neoplasm of brain: Secondary | ICD-10-CM

## 2017-12-01 DIAGNOSIS — M8580 Other specified disorders of bone density and structure, unspecified site: Secondary | ICD-10-CM | POA: Insufficient documentation

## 2017-12-01 DIAGNOSIS — R918 Other nonspecific abnormal finding of lung field: Secondary | ICD-10-CM

## 2017-12-01 DIAGNOSIS — Z452 Encounter for adjustment and management of vascular access device: Secondary | ICD-10-CM | POA: Insufficient documentation

## 2017-12-01 DIAGNOSIS — Z803 Family history of malignant neoplasm of breast: Secondary | ICD-10-CM | POA: Diagnosis not present

## 2017-12-01 LAB — CBC WITH DIFFERENTIAL/PLATELET
BASOS ABS: 0 10*3/uL (ref 0–0.1)
BASOS PCT: 1 %
EOS ABS: 0.1 10*3/uL (ref 0–0.7)
Eosinophils Relative: 4 %
HCT: 42.1 % (ref 35.0–47.0)
HEMOGLOBIN: 13.8 g/dL (ref 12.0–16.0)
Lymphocytes Relative: 35 %
Lymphs Abs: 0.7 10*3/uL — ABNORMAL LOW (ref 1.0–3.6)
MCH: 31.5 pg (ref 26.0–34.0)
MCHC: 32.8 g/dL (ref 32.0–36.0)
MCV: 96 fL (ref 80.0–100.0)
Monocytes Absolute: 0.2 10*3/uL (ref 0.2–0.9)
Monocytes Relative: 11 %
NEUTROS PCT: 49 %
Neutro Abs: 1 10*3/uL — ABNORMAL LOW (ref 1.4–6.5)
PLATELETS: 133 10*3/uL — AB (ref 150–440)
RBC: 4.38 MIL/uL (ref 3.80–5.20)
RDW: 15.5 % — ABNORMAL HIGH (ref 11.5–14.5)
WBC: 2.1 10*3/uL — AB (ref 3.6–11.0)

## 2017-12-01 LAB — COMPREHENSIVE METABOLIC PANEL
ALT: 42 U/L (ref 14–54)
AST: 54 U/L — AB (ref 15–41)
Albumin: 3.7 g/dL (ref 3.5–5.0)
Alkaline Phosphatase: 145 U/L — ABNORMAL HIGH (ref 38–126)
Anion gap: 10 (ref 5–15)
BILIRUBIN TOTAL: 0.7 mg/dL (ref 0.3–1.2)
BUN: 14 mg/dL (ref 6–20)
CO2: 26 mmol/L (ref 22–32)
CREATININE: 0.79 mg/dL (ref 0.44–1.00)
Calcium: 9.3 mg/dL (ref 8.9–10.3)
Chloride: 102 mmol/L (ref 101–111)
Glucose, Bld: 85 mg/dL (ref 65–99)
Potassium: 4.8 mmol/L (ref 3.5–5.1)
Sodium: 138 mmol/L (ref 135–145)
TOTAL PROTEIN: 7.8 g/dL (ref 6.5–8.1)

## 2017-12-01 MED ORDER — FULVESTRANT 250 MG/5ML IM SOLN
500.0000 mg | Freq: Once | INTRAMUSCULAR | Status: AC
  Administered 2017-12-01: 500 mg via INTRAMUSCULAR
  Filled 2017-12-01: qty 10

## 2017-12-01 NOTE — Progress Notes (Signed)
[place] OFFICE PROGRESS NOTE  Patient Care Team: [name], MD as PCP - General (Family Medicine) Bary Castilla, Forest Gleason, MD (General Surgery) [name], MD (Inactive) as Attending Physician (Internal Medicine)  No matching staging information was found for the patient.   Oncology History   # LEFT BREAST IDC; STAGE II [cT2N1] ER >90%; PR- 50-90%; her 2 NEU- POS; s/p Neoadj chemo; AUG 2016- TCH+P s/p Lumpec & partial ALND- path CR; s/p RT [finished Nov 2016];  adj Herceptin; HELD for Jan 19th 2017 [in DEC 2016-EF dropped from 63 to 51%; FEB 27th EF-42.9%]; JAN 2016 START Arimidex; May 2017- EF- 60%; May 24th 2017-Re-start Herceptin q 3W; July 28th STOP herceptin [finished 22m/m2; sec to Low EF; however 2017 Oct EF= 67%; improved]  # DEC 4th 2017- Start Neratinib 4 pills; DEC 11th 3 pills; STOPPED.  # FEB 2018- RECURRENCE ER/PR positive; Her 2 NEGATIVE [liver Bx]  # MARCH 1st 2018- Tax-Cytoxan [poor tol]  # FEB 2018- Brain mets [SBRT; Dr.Crystal; finished Feb 28th 2018]  # March 2018- Eribulin- poor tolerance  # June 8th 2018- Started Faslodex + Abema [abema-multiple interruptions sec to neutropenia]  # PN- G-2; may 2017- Cymbalta 617md;MARCH 2018-  acute vascular insuff of BIL LE [? Taxotere vasoconstriction on asprin]; # hemorragic shock LGIB [s/p colo; Dr.Wohl]    # April 2016- Liver Bx- NEG;   # Drop in EF from Herceptin [recovered OCt 27th- EF 65%]  # BMD- jan 2017- osteopenia  -----------------------------------------------------------     [place]- Sep 4th 2018- PDL-1 0%/NEG for BRCA; MSI-Stable; Positive for PI3K; ccnd-1; FGF**     Carcinoma of upper-outer quadrant of left breast in female, estrogen receptor positive ([facility]    INTERVAL HISTORY:  A very pleasant [age]ear old female patient with above history of Recurrent stage IV ER/PR positive HER-2/neu NEGATIVE [status post liver biopsy February 2018] - is  currently on Faslodex-Abemaciclib is here for a follow up.   Patient states that she did not get her MRI because she overslept.   Otherwise denies any headaches.  Denies any nausea vomiting.  Appetite is good.  No swelling in the legs.  No abdominal distention.  No shortness of breath or cough.   REVIEW OF SYSTEMS:  A complete 10 point review of system is done which is negative except mentioned above/history of present illness.   PAST MEDICAL HISTORY :  Past Medical History:  Diagnosis Date  . Chemotherapy-induced peripheral neuropathy ([facility]  . Epistaxis    a. 11/2016 in setting of asa/plavix-->silver nitrate cauterization.  . GI bleed    a. 11/2016 Admission w/ GIB and hypovolemic shock req 3u PRBC's;  b. 11/2016 ECG: gastritis & nonbleeding peptic ulcer; c. 11/2016 Conlonoscopy: rectal and sigmoid colonic ulcers.  . Heart attack ([facility]   a. 1998 Cath @ [facility]: reportedly no intervention required.  . Marland Kitchenerceptin-induced cardiomyopathy ([facility]   a. In the setting of Herceptin Rx for breast cancer (initiated 12/2014); b. 03/2015 MUGA EF 64%; b. 08/2015 MUGA: EF 51%; c. 10/2015 MUGA: EF 44%; d. 11/2015 Echo: EF 45-50%; e. 01/2016 MUGA: EF 60%; f. 06/2016 MUGA EF 65%; g. 10/2016 MUGA: EF 61%;  h. 12/2016 Echo: EF 55-60%, gr1 DD.  . Marland Kitchenyperlipidemia   . Hypertension   . Neuropathy   . Possible PAD (peripheral artery disease) ([facility] [place]   a. 11/2016 LE cyanosis and weak pulses-->CTA w/o significant Ao-BiFem dzs. ? distal dzs-->ASA/Plavix initiated by vascular surgery.  .Marland Kitchen  PSVT (paroxysmal supraventricular tachycardia) ([place])    a. Dx 11/2016.  . Pulmonary embolism ([place])    a. 12/2016 CTA Chest: small nonocclusive PE in inferior segment of the Left lingula, somewhat eccentric filling defect suggesting chronic rather than acute embolic event; b. 12/6960 LE U/S:  No DVT; c. 12/2016 Echo: Nl RV fxn, nl PASP.  Marland Kitchen Recurrent Metastatic breast cancer ([place])    a. Dx 2016: Stage II, ER positive, PR positive, HER-2/neu  overexpressing of the left breast-->chemo/radiation; b. 10/2016 CT Abd/pelvis: diffuse liver mets, ill defined sclerotic bone lesions-T12;  c. 10/2016 MRI brain: metastatic lesion along L temporal lobe (19x69m) w/ extensive surrounding edema & 559mmidline shift to right.  . Sinus tachycardia     PAST SURGICAL HISTORY :   Past Surgical History:  Procedure Laterality Date  . BREAST BIOPSY Left 2016   Positive  . BREAST LUMPECTOMY WITH SENTINEL LYMPH NODE BIOPSY Left 05/23/2015   Procedure: LEFT BREAST WIDE EXCISION WITH AXILLARY DISSECTION, MASTOPLASTY ;  Surgeon: JeRobert BellowMD;  Location: [facility] ORS;  Service: General;  Laterality: Left;  . BREAST SURGERY Left 12/18/14   breast biopsy/INVASIVE DUCTAL CARCINOMA OF BREAST, NOTTINGHAM GRADE 2.  . Marland KitchenREAST SURGERY  05/23/2015.   Wide excision/mastoplasty, axillary dissection. No residual invasive cancer, positive for residual DCIS. 0/2 nodes identified on axillary dissection. (no SLN by technetium or methylene blue)  . CARDIAC CATHETERIZATION    . COLONOSCOPY WITH PROPOFOL N/A 12/10/2016   Procedure: COLONOSCOPY WITH PROPOFOL;  Surgeon: DaLucilla LameMD;  Location: [facility] ENDOSCOPY;  Service: Endoscopy;  Laterality: N/A;  . COLONOSCOPY WITH PROPOFOL N/A 02/13/2017   Procedure: COLONOSCOPY WITH PROPOFOL;  Surgeon: WoLucilla LameMD;  [facility];  Service: Endoscopy;  Laterality: N/A;  . ESOPHAGOGASTRODUODENOSCOPY (EGD) WITH PROPOFOL N/A 12/08/2016   Procedure: ESOPHAGOGASTRODUODENOSCOPY (EGD) WITH PROPOFOL;  Surgeon: DaLucilla LameMD;  Location: [facility] ENDOSCOPY;  Service: Endoscopy;  Laterality: N/A;  . IVC FILTER INSERTION N/A 02/15/2017   Procedure: IVC Filter Insertion;  Surgeon: DeAlgernon HuxleyMD;  [facility];  Service: Cardiovascular;  Laterality: N/A;  . PORTACATH PLACEMENT Right 12-31-14   Dr ByBary Castilla  FAMILY HISTORY :   Family History  Problem Relation Age of Onset  . Breast cancer Maternal Aunt   . Breast  cancer Cousin   . Brain cancer Maternal Uncle   . Diabetes Mother   . Hypertension Mother   . Stroke Mother     SOCIAL HISTORY:   Social History   Tobacco Use  . Smoking status: Light Tobacco Smoker    Packs/day: 0.50    Years: 18.00    Pack years: 9.00    Types: Cigarettes  . Smokeless tobacco: Never Used  Substance Use Topics  . Alcohol use: No    Alcohol/week: 0.0 oz  . Drug use: No    ALLERGIES:  is allergic to no known allergies.  MEDICATIONS:  Current Outpatient Medications  Medication Sig Dispense Refill  . ergocalciferol (VITAMIN D2) 50000 units capsule Take 1 capsule (50,000 Units total) once a week by mouth. 12 capsule 1  . flecainide (TAMBOCOR) 50 MG tablet Take 1 tablet (50 mg total) by mouth 2 (two) times daily. 60 tablet 11  . fluticasone (FLONASE) 50 MCG/ACT nasal spray Place 2 sprays into both nostrils daily. 16 g 0  . KLOR-CON M20 20 MEQ tablet TAKE ONE TABLE BY MOUTH TWICE A DAY 60 tablet 3  . magnesium oxide (MAG-OX) 400 MG tablet  Take 1 tablet (400 mg total) by mouth 2 (two) times daily. 60 tablet 6  . oxyCODONE (OXY IR/ROXICODONE) 5 MG immediate release tablet Take 1 tablet (5 mg total) by mouth every 8 (eight) hours as needed for severe pain. 42 tablet 0  . potassium chloride SA (K-DUR,KLOR-CON) 20 MEQ tablet Take 1 tablet (20 mEq total) by mouth daily. 90 tablet 3  . pregabalin (LYRICA) 75 MG capsule Take 1 capsule (75 mg total) by mouth 2 (two) times daily. 60 capsule 3  . VERZENIO 50 MG tablet TAKE ONE TABLET (50 MG TOTAL) BY MOUTH ONCE DAILY FOR 21 DAYS THEN OFF FOR 7 DAYS 28 tablet PRN  . diphenoxylate-atropine (LOMOTIL) 2.5-0.025 MG tablet Take 1 tablet by mouth 4 (four) times daily as needed for diarrhea or loose stools. Take it along with immodium (Patient not taking: Reported on 11/03/2017) 60 tablet 0  . feeding supplement, ENSURE ENLIVE, (ENSURE ENLIVE) LIQD Take 237 mLs by mouth 2 (two) times daily between meals. (Patient not taking: Reported on  11/03/2017) 237 mL 12  . ondansetron (ZOFRAN) 8 MG tablet Take 1 tablet (8 mg total) by mouth every 8 (eight) hours as needed for nausea or vomiting (start 3 days; after chemo). (Patient not taking: Reported on 11/03/2017) 40 tablet 1   No current facility-administered medications for this visit.    Facility-Administered Medications Ordered in Other Visits  Medication Dose Route Frequency Provider Last Rate Last Dose  . 0.9 %  sodium chloride infusion   Intravenous Continuous Pandit, Sandeep, MD      . 0.9 %  sodium chloride infusion   Intravenous Continuous Lloyd Huger, MD 999 mL/hr at 04/24/15 1520    . heparin lock flush 100 unit/mL  500 Units Intravenous Once Berenzon, Dmitriy, MD      . sodium chloride 0.9 % injection 10 mL  10 mL Intravenous PRN [name], MD   10 mL at 04/04/15 1440  . sodium chloride flush (NS) 0.9 % injection 10 mL  10 mL Intravenous PRN Berenzon, Dmitriy, MD      . Tbo-Filgrastim (GRANIX) injection 480 mcg  480 mcg Subcutaneous Once Cammie Sickle, MD        PHYSICAL EXAMINATION: ECOG PERFORMANCE STATUS: 0 - Asymptomatic  BP 124/89 (BP Location: Right Arm, Patient Position: Sitting)   Pulse 86   Temp 97.9 F (36.6 C) (Tympanic)   Resp 20   Ht 5' 6"  (1.676 m)   Wt 164 lb 6.4 oz (74.6 kg)   BMI 26.53 kg/m   Filed Weights   12/01/17 1049  Weight: 164 lb 6.4 oz (74.6 kg)    GENERAL: Well-nourished well-developed; Alert, no distress and comfortable. Accompanied by her brother.  She is walking with a cane. EYES: no pallor. No jaundice. She is in a wheelchair  OROPHARYNX: No  ulceration; good dentition ; NECK: supple, no masses felt LYMPH:  no palpable lymphadenopathy in the cervical, axillary or inguinal regions LUNGS: clear to auscultation and  No wheeze or crackles HEART/CVS: regular rate & rhythm and no murmurs; NO bilateral lower extremity edema.  ABDOMEN:abdomen soft, non-tender and normal bowel sounds Musculoskeletal:no cyanosis of  digits and no clubbing  PSYCH: alert & oriented x 3 with fluent speech NEURO: no focal motor/sensory deficits- except for foot drop on the right side.   LABORATORY DATA:  I have reviewed the data as listed    Component Value Date/Time   NA 138 12/01/2017 1010   NA 135 03/04/2017 0918  NA 135 01/08/2015 0850   K 4.8 12/01/2017 1010   K 3.7 01/08/2015 0850   CL 102 12/01/2017 1010   CL 101 01/08/2015 0850   CO2 26 12/01/2017 1010   CO2 26 01/08/2015 0850   GLUCOSE 85 12/01/2017 1010   GLUCOSE 161 (H) 01/08/2015 0850   BUN 14 12/01/2017 1010   BUN 8 03/04/2017 0918   BUN 17 01/08/2015 0850   CREATININE 0.79 12/01/2017 1010   CREATININE 0.82 01/22/2015 1559   CALCIUM 9.3 12/01/2017 1010   CALCIUM 9.3 01/08/2015 0850   PROT 7.8 12/01/2017 1010   PROT 6.4 03/04/2017 0918   PROT 6.8 01/22/2015 1559   ALBUMIN 3.7 12/01/2017 1010   ALBUMIN 2.5 (L) 03/04/2017 0918   ALBUMIN 3.9 01/22/2015 1559   AST 54 (H) 12/01/2017 1010   AST 34 01/22/2015 1559   ALT 42 12/01/2017 1010   ALT 42 01/22/2015 1559   ALKPHOS 145 (H) 12/01/2017 1010   ALKPHOS 157 (H) 01/22/2015 1559   BILITOT 0.7 12/01/2017 1010   BILITOT 6.2 (H) 03/04/2017 0918   BILITOT 0.2 (L) 01/22/2015 1559   GFRNONAA >60 12/01/2017 1010   GFRNONAA >60 01/22/2015 1559   GFRAA >60 12/01/2017 1010   GFRAA >60 01/22/2015 1559    No results found for: SPEP, UPEP  Lab Results  Component Value Date   WBC 2.1 (L) 12/01/2017   NEUTROABS 1.0 (L) 12/01/2017   HGB 13.8 12/01/2017   HCT 42.1 12/01/2017   MCV 96.0 12/01/2017   PLT 133 (L) 12/01/2017      Chemistry      Component Value Date/Time   NA 138 12/01/2017 1010   NA 135 03/04/2017 0918   NA 135 01/08/2015 0850   K 4.8 12/01/2017 1010   K 3.7 01/08/2015 0850   CL 102 12/01/2017 1010   CL 101 01/08/2015 0850   CO2 26 12/01/2017 1010   CO2 26 01/08/2015 0850   BUN 14 12/01/2017 1010   BUN 8 03/04/2017 0918   BUN 17 01/08/2015 0850   CREATININE 0.79  12/01/2017 1010   CREATININE 0.82 01/22/2015 1559      Component Value Date/Time   CALCIUM 9.3 12/01/2017 1010   CALCIUM 9.3 01/08/2015 0850   ALKPHOS 145 (H) 12/01/2017 1010   ALKPHOS 157 (H) 01/22/2015 1559   AST 54 (H) 12/01/2017 1010   AST 34 01/22/2015 1559   ALT 42 12/01/2017 1010   ALT 42 01/22/2015 1559   BILITOT 0.7 12/01/2017 1010   BILITOT 6.2 (H) 03/04/2017 0918   BILITOT 0.2 (L) 01/22/2015 1559            LABORATORY DATA:  I have reviewed the data as listed    Component Value Date/Time   NA 138 12/01/2017 1010   NA 135 03/04/2017 0918   NA 135 01/08/2015 0850   K 4.8 12/01/2017 1010   K 3.7 01/08/2015 0850   CL 102 12/01/2017 1010   CL 101 01/08/2015 0850   CO2 26 12/01/2017 1010   CO2 26 01/08/2015 0850   GLUCOSE 85 12/01/2017 1010   GLUCOSE 161 (H) 01/08/2015 0850   BUN 14 12/01/2017 1010   BUN 8 03/04/2017 0918   BUN 17 01/08/2015 0850   CREATININE 0.79 12/01/2017 1010   CREATININE 0.82 01/22/2015 1559   CALCIUM 9.3 12/01/2017 1010   CALCIUM 9.3 01/08/2015 0850   PROT 7.8 12/01/2017 1010   PROT 6.4 03/04/2017 0918   PROT 6.8 01/22/2015 1559   ALBUMIN 3.7  12/01/2017 1010   ALBUMIN 2.5 (L) 03/04/2017 0918   ALBUMIN 3.9 01/22/2015 1559   AST 54 (H) 12/01/2017 1010   AST 34 01/22/2015 1559   ALT 42 12/01/2017 1010   ALT 42 01/22/2015 1559   ALKPHOS 145 (H) 12/01/2017 1010   ALKPHOS 157 (H) 01/22/2015 1559   BILITOT 0.7 12/01/2017 1010   BILITOT 6.2 (H) 03/04/2017 0918   BILITOT 0.2 (L) 01/22/2015 1559   GFRNONAA >60 12/01/2017 1010   GFRNONAA >60 01/22/2015 1559   GFRAA >60 12/01/2017 1010   GFRAA >60 01/22/2015 1559    No results found for: SPEP, UPEP  Lab Results  Component Value Date   WBC 2.1 (L) 12/01/2017   NEUTROABS 1.0 (L) 12/01/2017   HGB 13.8 12/01/2017   HCT 42.1 12/01/2017   MCV 96.0 12/01/2017   PLT 133 (L) 12/01/2017      Chemistry      Component Value Date/Time   NA 138 12/01/2017 1010   NA 135 03/04/2017  0918   NA 135 01/08/2015 0850   K 4.8 12/01/2017 1010   K 3.7 01/08/2015 0850   CL 102 12/01/2017 1010   CL 101 01/08/2015 0850   CO2 26 12/01/2017 1010   CO2 26 01/08/2015 0850   BUN 14 12/01/2017 1010   BUN 8 03/04/2017 0918   BUN 17 01/08/2015 0850   CREATININE 0.79 12/01/2017 1010   CREATININE 0.82 01/22/2015 1559      Component Value Date/Time   CALCIUM 9.3 12/01/2017 1010   CALCIUM 9.3 01/08/2015 0850   ALKPHOS 145 (H) 12/01/2017 1010   ALKPHOS 157 (H) 01/22/2015 1559   AST 54 (H) 12/01/2017 1010   AST 34 01/22/2015 1559   ALT 42 12/01/2017 1010   ALT 42 01/22/2015 1559   BILITOT 0.7 12/01/2017 1010   BILITOT 6.2 (H) 03/04/2017 0918   BILITOT 0.2 (L) 01/22/2015 1559     IMPRESSION: Normal left ventricular wall motion with estimated ejection fraction of 65%.   Electronically Signed   By: Marijo Sanes M.D.   On: 07/27/2016 09:04  RADIOGRAPHIC STUDIES: I have personally reviewed the radiological images as listed and agreed with the findings in the report. No results found.  IMPRESSION: 1. 4 centimeter focus of ground-glass attenuation in the anterior right upper lobe is associated with new interstitial opacity in the left apex. These changes may be infectious/inflammatory. Attention on follow-up recommended. 2. New sclerotic focus in the inferior endplate of T4, may be a Schmorl's node but is new since prior chest CT. Attention on follow-up recommended. The sclerotic lesion in the T12 vertebral body is stable. 3. Stable to slight decrease and liver metastases. 4.  Aortic Atherosclerois (ICD10-170.0)   Electronically Signed   By: Misty Stanley M.D.   On: 10/05/2017 15:47 ASSESSMENT & PLAN:  Carcinoma of upper-outer quadrant of left breast in female, estrogen receptor positive ([place]) # RECURRENT Metastatic breast cancer- ER/PR positive HER-2/neu negative. CT jan 8th - shows partial response/stable disease in liver; ? New 4 cm ground glass opacity in RUL  [see discussion below]  # Currently on palliative Faslodex + Abemaciclib  at 60m/day;Tolerating well-WBC- 2.1; ANC today is 1.0 continue recommend taking abema 50 mg/day.  Will get scans prior to next visit.   # 4 cm RUL ground glass- ? Etiology; await CT scan.   # Brain mets- s/p RT [finished Feb 28th]; symptomatic improvement. Sep 25th MRI- significant improvement. awaiting MRI brain next week.   # Tachycardia/history of SVT-  stable on flecainide.    # PN G-1/ improving secondary to chemotherapy. Continue Lyrica; Continue oxycodone once a day as needed.   # faslodex today; follow up in 4 weeks/labs/faslodex; CT c/a/p- few days prior.  MRI scheduled for next week.   Orders Placed This Encounter  Procedures  . CT CHEST W CONTRAST    Standing Status:   Future    Standing Expiration Date:   12/02/2018    Order Specific Question:   If indicated for the ordered procedure, I authorize the administration of contrast media per Radiology protocol    Answer:   Yes    Order Specific Question:   Preferred imaging location?    Answer:   [facility]    Order Specific Question:   Radiology Contrast Protocol - do NOT remove file path    Answer:   [email or web address]  . CT Abdomen Pelvis W Contrast    Standing Status:   Future    Standing Expiration Date:   12/01/2018    Order Specific Question:   If indicated for the ordered procedure, I authorize the administration of contrast media per Radiology protocol    Answer:   Yes    Order Specific Question:   Preferred imaging location?    Answer:   [facility]    Order Specific Question:   Radiology Contrast Protocol - do NOT remove file path    Answer:   [email or web address]  . CBC with Differential/Platelet    Standing Status:   Future    Standing Expiration Date:   12/02/2018  . Comprehensive metabolic panel    Standing Status:   Future    Standing Expiration Date:   12/02/2018  . Cancer  antigen 15-3    Standing Status:   Future    Standing Expiration Date:   12/02/2018  . Cancer antigen 27.29    Standing Status:   Future    Standing Expiration Date:   12/02/2018   All questions were answered. The patient knows to call the clinic with any problems, questions or concerns.      Cammie Sickle, MD 12/01/2017 12:58 PM

## 2017-12-01 NOTE — Assessment & Plan Note (Signed)
#  RECURRENT Metastatic breast cancer- ER/PR positive HER-2/neu negative. CT jan 8th - shows partial response/stable disease in liver; ? New 4 cm ground glass opacity in RUL [see discussion below]  # Currently on palliative Faslodex + Abemaciclib  at 3m/day;Tolerating well-WBC- 2.1; ANC today is 1.0 continue recommend taking abema 50 mg/day.  Will get scans prior to next visit.   # 4 cm RUL ground glass- ? Etiology; await CT scan.   # Brain mets- s/p RT [finished Feb 28th]; symptomatic improvement. Sep 25th MRI- significant improvement. awaiting MRI brain next week.   # Tachycardia/history of SVT- stable on flecainide.    # PN G-1/ improving secondary to chemotherapy. Continue Lyrica; Continue oxycodone once a day as needed.   # faslodex today; follow up in 4 weeks/labs/faslodex; CT c/a/p- few days prior.  MRI scheduled for next week.

## 2017-12-02 LAB — CANCER ANTIGEN 27.29: CAN 27.29: 16.7 U/mL (ref 0.0–38.6)

## 2017-12-07 ENCOUNTER — Telehealth: Payer: Self-pay | Admitting: Cardiovascular Disease

## 2017-12-07 ENCOUNTER — Ambulatory Visit
Admission: RE | Admit: 2017-12-07 | Discharge: 2017-12-07 | Disposition: A | Discharge status: Home / Self Care | Payer: BLUE CROSS/BLUE SHIELD | Source: Ambulatory Visit | Attending: Internal Medicine | Admitting: Internal Medicine

## 2017-12-07 ENCOUNTER — Inpatient Hospital Stay: Payer: BLUE CROSS/BLUE SHIELD

## 2017-12-07 DIAGNOSIS — Z79818 Long term (current) use of other agents affecting estrogen receptors and estrogen levels: Secondary | ICD-10-CM | POA: Diagnosis not present

## 2017-12-07 DIAGNOSIS — G939 Disorder of brain, unspecified: Secondary | ICD-10-CM | POA: Diagnosis not present

## 2017-12-07 DIAGNOSIS — Z9221 Personal history of antineoplastic chemotherapy: Secondary | ICD-10-CM | POA: Diagnosis not present

## 2017-12-07 DIAGNOSIS — E785 Hyperlipidemia, unspecified: Secondary | ICD-10-CM | POA: Diagnosis not present

## 2017-12-07 DIAGNOSIS — C50412 Malignant neoplasm of upper-outer quadrant of left female breast: Secondary | ICD-10-CM | POA: Diagnosis not present

## 2017-12-07 DIAGNOSIS — C50919 Malignant neoplasm of unspecified site of unspecified female breast: Secondary | ICD-10-CM | POA: Diagnosis not present

## 2017-12-07 DIAGNOSIS — I252 Old myocardial infarction: Secondary | ICD-10-CM | POA: Diagnosis not present

## 2017-12-07 DIAGNOSIS — M8580 Other specified disorders of bone density and structure, unspecified site: Secondary | ICD-10-CM | POA: Diagnosis not present

## 2017-12-07 DIAGNOSIS — C7931 Secondary malignant neoplasm of brain: Secondary | ICD-10-CM | POA: Diagnosis not present

## 2017-12-07 DIAGNOSIS — R918 Other nonspecific abnormal finding of lung field: Secondary | ICD-10-CM | POA: Diagnosis not present

## 2017-12-07 DIAGNOSIS — I10 Essential (primary) hypertension: Secondary | ICD-10-CM | POA: Diagnosis not present

## 2017-12-07 DIAGNOSIS — Z923 Personal history of irradiation: Secondary | ICD-10-CM | POA: Diagnosis not present

## 2017-12-07 DIAGNOSIS — M21371 Foot drop, right foot: Secondary | ICD-10-CM | POA: Diagnosis not present

## 2017-12-07 DIAGNOSIS — Z170 Estrogen receptor positive status [ER+]: Secondary | ICD-10-CM | POA: Diagnosis not present

## 2017-12-07 DIAGNOSIS — Z95828 Presence of other vascular implants and grafts: Secondary | ICD-10-CM

## 2017-12-07 DIAGNOSIS — C787 Secondary malignant neoplasm of liver and intrahepatic bile duct: Secondary | ICD-10-CM | POA: Diagnosis not present

## 2017-12-07 DIAGNOSIS — Z452 Encounter for adjustment and management of vascular access device: Secondary | ICD-10-CM | POA: Diagnosis not present

## 2017-12-07 DIAGNOSIS — F17210 Nicotine dependence, cigarettes, uncomplicated: Secondary | ICD-10-CM | POA: Diagnosis not present

## 2017-12-07 DIAGNOSIS — Z853 Personal history of malignant neoplasm of breast: Secondary | ICD-10-CM | POA: Diagnosis not present

## 2017-12-07 MED ORDER — SODIUM CHLORIDE 0.9% FLUSH
10.0000 mL | INTRAVENOUS | Status: AC | PRN
  Administered 2017-12-07: 10 mL
  Filled 2017-12-07: qty 10

## 2017-12-07 MED ORDER — HEPARIN SOD (PORK) LOCK FLUSH 100 UNIT/ML IV SOLN
500.0000 [IU] | INTRAVENOUS | Status: AC | PRN
  Administered 2017-12-07: 500 [IU]

## 2017-12-07 MED ORDER — GADOBENATE DIMEGLUMINE 529 MG/ML IV SOLN
15.0000 mL | Freq: Once | INTRAVENOUS | Status: AC | PRN
  Administered 2017-12-07: 15 mL via INTRAVENOUS

## 2017-12-07 NOTE — Telephone Encounter (Signed)
Patient and her brother Christiane Ha [place] are both scheduled with Dr. Fletcher Anon on March 14, 8:40am and 11am. Patient had asked if their appointment times could be closer together. Anderson Malta H in scheduling tried to move appointments but was unable. Patient states she and Mr. [place] can drive separately.

## 2017-12-07 NOTE — Telephone Encounter (Signed)
Patient came by to check on status- states she has spoke with Ivin Booty about possible appt change with her and [name] [place] Please advise

## 2017-12-09 ENCOUNTER — Encounter: Payer: Self-pay | Admitting: Cardiovascular Disease

## 2017-12-09 ENCOUNTER — Telehealth: Payer: Self-pay

## 2017-12-09 ENCOUNTER — Other Ambulatory Visit: Payer: Self-pay

## 2017-12-09 ENCOUNTER — Ambulatory Visit (INDEPENDENT_AMBULATORY_CARE_PROVIDER_SITE_OTHER): Payer: BLUE CROSS/BLUE SHIELD | Admitting: Cardiovascular Disease

## 2017-12-09 VITALS — BP 108/70 | HR 81 | Ht 66.0 in | Wt 165.8 lb

## 2017-12-09 DIAGNOSIS — J069 Acute upper respiratory infection, unspecified: Secondary | ICD-10-CM

## 2017-12-09 DIAGNOSIS — I471 Supraventricular tachycardia: Secondary | ICD-10-CM | POA: Diagnosis not present

## 2017-12-09 DIAGNOSIS — T451X5A Adverse effect of antineoplastic and immunosuppressive drugs, initial encounter: Secondary | ICD-10-CM

## 2017-12-09 DIAGNOSIS — I427 Cardiomyopathy due to drug and external agent: Secondary | ICD-10-CM | POA: Diagnosis not present

## 2017-12-09 MED ORDER — FLUTICASONE PROPIONATE 50 MCG/ACT NA SUSP: 2.0000 | Freq: Every day | NASAL | 0 refills | Status: DC

## 2017-12-09 NOTE — Telephone Encounter (Signed)
Patient requesting refill of Flonase to [facility].

## 2017-12-09 NOTE — Telephone Encounter (Signed)
Return phone call placed to [facility], Therapist, sports. She notified patient of new findings on scan, and that Dr. B would place a  phone call to her personally in the AM to discuss these results further. Appt has been made with Dr. Donella Stade on Monday March 18th at 10:30, and patient notified and verbalized understanding.

## 2017-12-09 NOTE — Progress Notes (Signed)
Cardiology Office Note   Date:  12/09/2017   ID:  Sarah Horne, DOB 1956-01-11, MRN [id number]  PCP:  [name], MD  Cardiologist:   Kathlyn Sacramento, MD  Oncologist: Dr.Brahmanday  Chief Complaint  Patient presents with  . other    4 month folllow up. Meds reviewed by the pt. verbally. "doing well."       History of Present Illness: Sarah Horne is a 62 y.o. female who Is here today for a follow-up visit regarding Herceptin-induced cardiomyopathy and paroxysmal supraventricular tachycardia. The patient reports possible prior myocardial infarction in 1998 where she underwent cardiac catheterization at [facility]. She did not require revascularization and she was treated medically. The details of that admission are not available. She has chronic medical conditions that include hypertension, hyperlipidemia and tobacco use . She has known history of breast cancer with brain metastases diagnosed in 2018.she had multiple hospitalizations related to complications of breast cancer and its treatment.she was noted to have recurrent episodes of supraventricular tachycardia in the setting of acute medical abnormalities.this has been treated with flecainide. The patient had pulmonary embolism and had an IVC filter placed.she also had recurrent ascites.  During last visit, I stopped flecainide but she called [place] back and reported recurrent SVT with a heart rate of 139 bpm.  Thus, flecainide was resumed. She has been stable from a cardiac standpoint with no recurrent arrhythmia since she started flecainide.  No chest pain or shortness of breath.  Past Medical History:  Diagnosis Date  . Chemotherapy-induced peripheral neuropathy ([place])   . Epistaxis    a. 11/2016 in setting of asa/plavix-->silver nitrate cauterization.  . GI bleed    a. 11/2016 Admission w/ GIB and hypovolemic shock req 3u PRBC's;  b. 11/2016 ECG: gastritis & nonbleeding peptic ulcer; c. 11/2016 Conlonoscopy: rectal and sigmoid  colonic ulcers.  . Heart attack ([place])    a. 1998 Cath @ [facility]: reportedly no intervention required.  Marland Kitchen Herceptin-induced cardiomyopathy ([place])    a. In the setting of Herceptin Rx for breast cancer (initiated 12/2014); b. 03/2015 MUGA EF 64%; b. 08/2015 MUGA: EF 51%; c. 10/2015 MUGA: EF 44%; d. 11/2015 Echo: EF 45-50%; e. 01/2016 MUGA: EF 60%; f. 06/2016 MUGA EF 65%; g. 10/2016 MUGA: EF 61%;  h. 12/2016 Echo: EF 55-60%, gr1 DD.  Marland Kitchen Hyperlipidemia   . Hypertension   . Neuropathy   . Possible PAD (peripheral artery disease) ([place])    a. 11/2016 LE cyanosis and weak pulses-->CTA w/o significant Ao-BiFem dzs. ? distal dzs-->ASA/Plavix initiated by vascular surgery.  Marland Kitchen PSVT (paroxysmal supraventricular tachycardia) ([place])    a. Dx 11/2016.  . Pulmonary embolism ([place])    a. 12/2016 CTA Chest: small nonocclusive PE in inferior segment of the Left lingula, somewhat eccentric filling defect suggesting chronic rather than acute embolic event; b. 11/3823 LE U/S:  No DVT; c. 12/2016 Echo: Nl RV fxn, nl PASP.  Marland Kitchen Recurrent Metastatic breast cancer ([place])    a. Dx 2016: Stage II, ER positive, PR positive, HER-2/neu overexpressing of the left breast-->chemo/radiation; b. 10/2016 CT Abd/pelvis: diffuse liver mets, ill defined sclerotic bone lesions-T12;  c. 10/2016 MRI brain: metastatic lesion along L temporal lobe (19x16m) w/ extensive surrounding edema & 545mmidline shift to right.  . Sinus tachycardia     Past Surgical History:  Procedure Laterality Date  . BREAST BIOPSY Left 2016   Positive  . BREAST LUMPECTOMY WITH SENTINEL LYMPH NODE BIOPSY Left 05/23/2015   Procedure:  LEFT BREAST WIDE EXCISION WITH AXILLARY DISSECTION, MASTOPLASTY ;  Surgeon: [name], MD;  Location: [facility] ORS;  Service: General;  Laterality: Left;  . BREAST SURGERY Left 12/18/14   breast biopsy/INVASIVE DUCTAL CARCINOMA OF BREAST, NOTTINGHAM GRADE 2.  Marland Kitchen BREAST SURGERY  05/23/2015.   Wide excision/mastoplasty, axillary dissection. No residual  invasive cancer, positive for residual DCIS. 0/2 nodes identified on axillary dissection. (no SLN by technetium or methylene blue)  . CARDIAC CATHETERIZATION    . COLONOSCOPY WITH PROPOFOL N/A 12/10/2016   Procedure: COLONOSCOPY WITH PROPOFOL;  Surgeon: Lucilla Lame, MD;  Location: [facility] ENDOSCOPY;  Service: Endoscopy;  Laterality: N/A;  . COLONOSCOPY WITH PROPOFOL N/A 02/13/2017   Procedure: COLONOSCOPY WITH PROPOFOL;  Surgeon: Lucilla Lame, MD;  Location: [facility] ENDOSCOPY;  Service: Endoscopy;  Laterality: N/A;  . ESOPHAGOGASTRODUODENOSCOPY (EGD) WITH PROPOFOL N/A 12/08/2016   Procedure: ESOPHAGOGASTRODUODENOSCOPY (EGD) WITH PROPOFOL;  Surgeon: Lucilla Lame, MD;  Location: [facility] ENDOSCOPY;  Service: Endoscopy;  Laterality: N/A;  . IVC FILTER INSERTION N/A 02/15/2017   Procedure: IVC Filter Insertion;  Surgeon: Algernon Huxley, MD;  Location: [facility];  Service: Cardiovascular;  Laterality: N/A;  . PORTACATH PLACEMENT Right 12-31-14   Dr Bary Castilla     Current Outpatient Medications  Medication Sig Dispense Refill  . ergocalciferol (VITAMIN D2) 50000 units capsule Take 1 capsule (50,000 Units total) once a week by mouth. 12 capsule 1  . feeding supplement, ENSURE ENLIVE, (ENSURE ENLIVE) LIQD Take 237 mLs by mouth 2 (two) times daily between meals. 237 mL 12  . flecainide (TAMBOCOR) 50 MG tablet Take 1 tablet (50 mg total) by mouth 2 (two) times daily. 60 tablet 11  . fluticasone (FLONASE) 50 MCG/ACT nasal spray Place 2 sprays into both nostrils daily. 16 g 0  . magnesium oxide (MAG-OX) 400 MG tablet Take 1 tablet (400 mg total) by mouth 2 (two) times daily. 60 tablet 6  . oxyCODONE (OXY IR/ROXICODONE) 5 MG immediate release tablet Take 1 tablet (5 mg total) by mouth every 8 (eight) hours as needed for severe pain. 42 tablet 0  . potassium chloride (KLOR-CON) 20 MEQ packet Take by mouth 2 (two) times daily.    . pregabalin (LYRICA) 75 MG capsule Take 1 capsule (75 mg total) by mouth 2 (two) times  daily. 60 capsule 3  . VERZENIO 50 MG tablet TAKE ONE TABLET (50 MG TOTAL) BY MOUTH ONCE DAILY FOR 21 DAYS THEN OFF FOR 7 DAYS 28 tablet PRN   No current facility-administered medications for this visit.    Facility-Administered Medications Ordered in Other Visits  Medication Dose Route Frequency Provider Last Rate Last Dose  . 0.9 %  sodium chloride infusion   Intravenous Continuous Pandit, Sandeep, MD      . 0.9 %  sodium chloride infusion   Intravenous Continuous Lloyd Huger, MD 999 mL/hr at 04/24/15 1520    . heparin lock flush 100 unit/mL  500 Units Intravenous Once Berenzon, Dmitriy, MD      . sodium chloride 0.9 % injection 10 mL  10 mL Intravenous PRN [name], MD   10 mL at 04/04/15 1440  . sodium chloride flush (NS) 0.9 % injection 10 mL  10 mL Intravenous PRN Berenzon, Dmitriy, MD      . Tbo-Filgrastim (GRANIX) injection 480 mcg  480 mcg Subcutaneous Once Cammie Sickle, MD        Allergies:   No known allergies    Social History:  The patient  reports that she has been smoking cigarettes.  She has a 9.00 pack-year smoking history. she has never used smokeless tobacco. She reports that she does not drink alcohol or use drugs.   Family History:  The patient's family history includes Brain cancer in her maternal uncle; Breast cancer in her cousin and maternal aunt; Diabetes in her mother; Hypertension in her mother; Stroke in her mother.    ROS:  Please see the history of present illness.   Otherwise, review of systems are positive for none.   All other systems are reviewed and negative.    PHYSICAL EXAM: VS:  BP 108/70 (BP Location: Right Arm, Patient Position: Sitting, Cuff Size: Normal)   Pulse 81   Ht 5' 6"  (1.676 m)   Wt 165 lb 12 oz (75.2 kg)   BMI 26.75 kg/m  , BMI Body mass index is 26.75 kg/m. GEN: Well nourished, well developed, in no acute distress  HEENT: normal  Neck: no JVD, carotid bruits, or masses Cardiac: RRR; no murmurs, rubs, or  gallops,no edema  Respiratory:  clear to auscultation bilaterally, normal work of breathing GI: soft, nontender, nondistended, + BS MS: no deformity or atrophy  Skin: warm and dry, no rash Neuro:  Strength and sensation are intact Psych: euthymic mood, full affect   EKG:  EKG is  ordered today. EKG showed normal sinus rhythm with first-degree AV block and left atrial enlargement  Recent Labs: 01/12/2017: B Natriuretic Peptide 167.0; TSH 1.410 03/16/2017: Magnesium 1.7 12/01/2017: ALT 42; BUN 14; Creatinine, Ser 0.79; Hemoglobin 13.8; Platelets 133; Potassium 4.8; Sodium 138    Lipid Panel    Component Value Date/Time   CHOL 136 01/18/2017 0957   TRIG 127 01/18/2017 0957   HDL 19 (L) 01/18/2017 0957   CHOLHDL 7.2 (H) 01/18/2017 0957   VLDL 25 01/18/2017 0957   LDLCALC 92 01/18/2017 0957      Wt Readings from Last 3 Encounters:  12/09/17 165 lb 12 oz (75.2 kg)  12/01/17 164 lb 6.4 oz (74.6 kg)  11/12/17 159 lb 1.6 oz (72.2 kg)         ASSESSMENT AND PLAN:  1.  Paroxysmal supraventricular tachycardia: No recurrent arrhythmia on current dose of flecainide 50 mg twice daily.  2. Chemotherapy induced cardiomyopathy:   This was due to Herceptin with subsequent improvement in LV systolic function to normal.  3. Essential hypertension: she is no longer having episodes of hypotension and she is currently not on any antihypertensive medications.  4.  Metastatic breast cancer, followed closely by oncology.   Disposition:   FU with me in 6  months  Signed,  Kathlyn Sacramento, MD  12/09/2017 8:59 AM    [place]

## 2017-12-09 NOTE — Patient Instructions (Signed)
Medication Instructions: Continue same medications.   Labwork: None.   Procedures/Testing: None.   Follow-Up: 6 months with Dr. Tip Atienza.   Any Additional Special Instructions Will Be Listed Below (If Applicable).     If you need a refill on your cardiac medications before your next appointment, please call your pharmacy.   

## 2017-12-09 NOTE — Telephone Encounter (Signed)
I left a message for patient to return phone call to discuss scan results.

## 2017-12-10 ENCOUNTER — Telehealth: Payer: Self-pay | Admitting: Internal Medicine

## 2017-12-10 NOTE — Telephone Encounter (Signed)
Spoke to pt; re: MRI brain ? lepto-meninegeal disease; awaiting evaluation with RT on 3/18.   Dr.Chrystal- please call me if any thoughts. Thx

## 2017-12-13 ENCOUNTER — Encounter: Payer: Self-pay | Admitting: Radiation Oncology

## 2017-12-13 ENCOUNTER — Ambulatory Visit
Admission: RE | Admit: 2017-12-13 | Discharge: 2017-12-13 | Disposition: A | Discharge status: Home / Self Care | Payer: BLUE CROSS/BLUE SHIELD | Source: Ambulatory Visit | Attending: Radiation Oncology | Admitting: Radiation Oncology

## 2017-12-13 ENCOUNTER — Other Ambulatory Visit: Payer: Self-pay

## 2017-12-13 VITALS — BP 136/95 | HR 110 | Temp 97.5°F | Resp 18 | Wt 166.3 lb

## 2017-12-13 DIAGNOSIS — C50412 Malignant neoplasm of upper-outer quadrant of left female breast: Secondary | ICD-10-CM | POA: Insufficient documentation

## 2017-12-13 DIAGNOSIS — C787 Secondary malignant neoplasm of liver and intrahepatic bile duct: Secondary | ICD-10-CM | POA: Diagnosis not present

## 2017-12-13 DIAGNOSIS — C7931 Secondary malignant neoplasm of brain: Secondary | ICD-10-CM | POA: Diagnosis not present

## 2017-12-13 DIAGNOSIS — Z923 Personal history of irradiation: Secondary | ICD-10-CM | POA: Insufficient documentation

## 2017-12-13 DIAGNOSIS — Z170 Estrogen receptor positive status [ER+]: Secondary | ICD-10-CM | POA: Diagnosis not present

## 2017-12-13 DIAGNOSIS — C773 Secondary and unspecified malignant neoplasm of axilla and upper limb lymph nodes: Secondary | ICD-10-CM | POA: Diagnosis not present

## 2017-12-13 NOTE — Progress Notes (Signed)
Radiation Oncology Follow up Note Old patient new area brain metastases  Name: Sarah Horne   Date:   12/13/2017 MRN:  [id number] DOB: 1956/03/15    This 62 y.o. female presents to the clinic today for reevaluation of progressive brain metastasis in patient previously treated with IM RT radiation therapy for solitary brain metastasis proximal 1 year prior.  REFERRING PROVIDER: [name], MD  HPI: patient is a 62 year old female with known stage IV triple positive invasive mammary carcinoma the left breast. She initially underwent neoadjuvant chemotherapy plus Herceptin and received postoperative whole breast and peripheral hepatic radiation. She does have known liver metastasis as well as an MRI scan showing a left temporal solitary lesion which we treated almost 1 year prior withIMRT..he is currently onis currently on Faslodex-Abemaciclib .she recently had a repeat MRI scan of the brain showing multiple smallbrain metastasis. Her left temporal lobe lesion which was previously treated showing stablesize. There is also new left meningeal enhancement along the inferior cerebellum bilaterally compatible with leptomeningeal carcinomatosis. I been asked to evaluate her for possible further palliative radiation therapy to her whole brain. She is without complaint. She specifically denies any change in visual fields any focal neurologic deficits or inability to ambulate.    COMPLICATIONS OF TREATMENT: none  FOLLOW UP COMPLIANCE: keeps appointments   PHYSICAL EXAM:  BP (!) 136/95   Pulse (!) 110   Temp (!) 97.5 F (36.4 C)   Resp 18   Wt 166 lb 5.4 oz (75.4 kg)   BMI 26.85 kg/m  Well-developed well-nourished patient in NAD. HEENT reveals PERLA, EOMI, discs not visualized.  Oral cavity is clear. No oral mucosal lesions are identified. Neck is clear without evidence of cervical or supraclavicular adenopathy. Lungs are clear to A&P. Cardiac examination is essentially unremarkable with  regular rate and rhythm without murmur rub or thrill. Abdomen is benign with no organomegaly or masses noted. Motor sensory and DTR levels are equal and symmetric in the upper and lower extremities. Cranial nerves II through XII are grossly intact. Proprioception is intact. No peripheral adenopathy or edema is identified. No motor or sensory levels are noted. Crude visual fields are within normal range.  RADIOLOGY RESULTS: MRI scan of brain reviewed and compatible with the above-stated findings  PLAN: at this time I to go ahead with palliative radiation therapy to her whole brain. Would treat to 3000 cGy in 12 fractions. Risks and benefits of treatment including irritation of the skin fatigue alteration of blood counts and possible cognitive decline all were discussed in detail with the patient. I feel be safe to treat whole brain radiation even though we have done partial brain radiation in the past. I personally set up and ordered CT simulation for later this week.  I would like to take this opportunity to thank you for allowing me to participate in the care of your patient.Noreene Filbert, MD

## 2017-12-16 ENCOUNTER — Ambulatory Visit
Admission: RE | Admit: 2017-12-16 | Discharge: 2017-12-16 | Disposition: A | Discharge status: Home / Self Care | Payer: BLUE CROSS/BLUE SHIELD | Source: Ambulatory Visit | Attending: Radiation Oncology | Admitting: Radiation Oncology

## 2017-12-16 DIAGNOSIS — C773 Secondary and unspecified malignant neoplasm of axilla and upper limb lymph nodes: Secondary | ICD-10-CM | POA: Diagnosis not present

## 2017-12-16 DIAGNOSIS — C50912 Malignant neoplasm of unspecified site of left female breast: Secondary | ICD-10-CM | POA: Insufficient documentation

## 2017-12-16 DIAGNOSIS — Z510 Encounter for antineoplastic radiation therapy: Secondary | ICD-10-CM | POA: Diagnosis not present

## 2017-12-16 DIAGNOSIS — Z170 Estrogen receptor positive status [ER+]: Secondary | ICD-10-CM | POA: Diagnosis not present

## 2017-12-16 DIAGNOSIS — C7931 Secondary malignant neoplasm of brain: Secondary | ICD-10-CM | POA: Insufficient documentation

## 2017-12-16 DIAGNOSIS — C787 Secondary malignant neoplasm of liver and intrahepatic bile duct: Secondary | ICD-10-CM | POA: Diagnosis not present

## 2017-12-16 DIAGNOSIS — C50412 Malignant neoplasm of upper-outer quadrant of left female breast: Secondary | ICD-10-CM | POA: Diagnosis not present

## 2017-12-17 ENCOUNTER — Other Ambulatory Visit: Payer: Self-pay | Admitting: *Deleted

## 2017-12-22 ENCOUNTER — Ambulatory Visit
Admission: RE | Admit: 2017-12-22 | Discharge: 2017-12-22 | Disposition: A | Discharge status: Home / Self Care | Payer: BLUE CROSS/BLUE SHIELD | Source: Ambulatory Visit | Attending: Radiation Oncology | Admitting: Radiation Oncology

## 2017-12-22 DIAGNOSIS — Z170 Estrogen receptor positive status [ER+]: Secondary | ICD-10-CM | POA: Diagnosis not present

## 2017-12-22 DIAGNOSIS — C773 Secondary and unspecified malignant neoplasm of axilla and upper limb lymph nodes: Secondary | ICD-10-CM | POA: Diagnosis not present

## 2017-12-22 DIAGNOSIS — C50912 Malignant neoplasm of unspecified site of left female breast: Secondary | ICD-10-CM | POA: Diagnosis not present

## 2017-12-22 DIAGNOSIS — C7931 Secondary malignant neoplasm of brain: Secondary | ICD-10-CM | POA: Diagnosis not present

## 2017-12-22 DIAGNOSIS — C50412 Malignant neoplasm of upper-outer quadrant of left female breast: Secondary | ICD-10-CM | POA: Diagnosis not present

## 2017-12-22 DIAGNOSIS — C787 Secondary malignant neoplasm of liver and intrahepatic bile duct: Secondary | ICD-10-CM | POA: Diagnosis not present

## 2017-12-22 DIAGNOSIS — Z510 Encounter for antineoplastic radiation therapy: Secondary | ICD-10-CM | POA: Diagnosis not present

## 2017-12-23 ENCOUNTER — Ambulatory Visit
Admission: RE | Admit: 2017-12-23 | Discharge: 2017-12-23 | Disposition: A | Discharge status: Home / Self Care | Payer: BLUE CROSS/BLUE SHIELD | Source: Ambulatory Visit | Attending: Radiation Oncology | Admitting: Radiation Oncology

## 2017-12-23 ENCOUNTER — Other Ambulatory Visit: Payer: Self-pay | Admitting: *Deleted

## 2017-12-23 DIAGNOSIS — Z170 Estrogen receptor positive status [ER+]: Secondary | ICD-10-CM | POA: Diagnosis not present

## 2017-12-23 DIAGNOSIS — C787 Secondary malignant neoplasm of liver and intrahepatic bile duct: Secondary | ICD-10-CM | POA: Diagnosis not present

## 2017-12-23 DIAGNOSIS — Z510 Encounter for antineoplastic radiation therapy: Secondary | ICD-10-CM | POA: Diagnosis not present

## 2017-12-23 DIAGNOSIS — C7931 Secondary malignant neoplasm of brain: Secondary | ICD-10-CM | POA: Diagnosis not present

## 2017-12-23 DIAGNOSIS — C50912 Malignant neoplasm of unspecified site of left female breast: Secondary | ICD-10-CM | POA: Diagnosis not present

## 2017-12-23 MED ORDER — DEXAMETHASONE 4 MG PO TABS: 4.0000 mg | ORAL_TABLET | Freq: Every day | ORAL | 0 refills | Status: DC

## 2017-12-24 ENCOUNTER — Ambulatory Visit
Admission: RE | Admit: 2017-12-24 | Discharge: 2017-12-24 | Disposition: A | Discharge status: Home / Self Care | Payer: BLUE CROSS/BLUE SHIELD | Source: Ambulatory Visit | Attending: Radiation Oncology | Admitting: Radiation Oncology

## 2017-12-24 DIAGNOSIS — C50912 Malignant neoplasm of unspecified site of left female breast: Secondary | ICD-10-CM | POA: Diagnosis not present

## 2017-12-24 DIAGNOSIS — Z170 Estrogen receptor positive status [ER+]: Secondary | ICD-10-CM | POA: Diagnosis not present

## 2017-12-24 DIAGNOSIS — Z510 Encounter for antineoplastic radiation therapy: Secondary | ICD-10-CM | POA: Diagnosis not present

## 2017-12-24 DIAGNOSIS — C7931 Secondary malignant neoplasm of brain: Secondary | ICD-10-CM | POA: Diagnosis not present

## 2017-12-24 DIAGNOSIS — C787 Secondary malignant neoplasm of liver and intrahepatic bile duct: Secondary | ICD-10-CM | POA: Diagnosis not present

## 2017-12-27 ENCOUNTER — Other Ambulatory Visit: Payer: Self-pay

## 2017-12-27 ENCOUNTER — Encounter: Payer: Self-pay | Admitting: Emergency Medicine

## 2017-12-27 ENCOUNTER — Other Ambulatory Visit: Payer: Self-pay | Admitting: Internal Medicine

## 2017-12-27 ENCOUNTER — Inpatient Hospital Stay: Payer: BLUE CROSS/BLUE SHIELD | Attending: Internal Medicine

## 2017-12-27 ENCOUNTER — Ambulatory Visit
Admission: RE | Admit: 2017-12-27 | Discharge: 2017-12-27 | Disposition: A | Discharge status: Home / Self Care | Payer: BLUE CROSS/BLUE SHIELD | Source: Ambulatory Visit | Attending: Radiation Oncology | Admitting: Radiation Oncology

## 2017-12-27 ENCOUNTER — Emergency Department
Admission: EM | Admit: 2017-12-27 | Discharge: 2017-12-27 | Disposition: A | Discharge status: Home / Self Care | Payer: BLUE CROSS/BLUE SHIELD | Attending: Emergency Medicine | Admitting: Emergency Medicine

## 2017-12-27 ENCOUNTER — Ambulatory Visit
Admission: RE | Admit: 2017-12-27 | Discharge: 2017-12-27 | Disposition: A | Discharge status: Home / Self Care | Payer: BLUE CROSS/BLUE SHIELD | Source: Ambulatory Visit | Attending: Internal Medicine | Admitting: Internal Medicine

## 2017-12-27 DIAGNOSIS — Y92410 Unspecified street and highway as the place of occurrence of the external cause: Secondary | ICD-10-CM | POA: Diagnosis not present

## 2017-12-27 DIAGNOSIS — Z9221 Personal history of antineoplastic chemotherapy: Secondary | ICD-10-CM | POA: Diagnosis not present

## 2017-12-27 DIAGNOSIS — M8580 Other specified disorders of bone density and structure, unspecified site: Secondary | ICD-10-CM | POA: Diagnosis not present

## 2017-12-27 DIAGNOSIS — G44319 Acute post-traumatic headache, not intractable: Secondary | ICD-10-CM | POA: Diagnosis not present

## 2017-12-27 DIAGNOSIS — C7931 Secondary malignant neoplasm of brain: Secondary | ICD-10-CM | POA: Diagnosis not present

## 2017-12-27 DIAGNOSIS — F17210 Nicotine dependence, cigarettes, uncomplicated: Secondary | ICD-10-CM | POA: Insufficient documentation

## 2017-12-27 DIAGNOSIS — D259 Leiomyoma of uterus, unspecified: Secondary | ICD-10-CM

## 2017-12-27 DIAGNOSIS — D123 Benign neoplasm of transverse colon: Secondary | ICD-10-CM | POA: Diagnosis not present

## 2017-12-27 DIAGNOSIS — I252 Old myocardial infarction: Secondary | ICD-10-CM | POA: Insufficient documentation

## 2017-12-27 DIAGNOSIS — Z5111 Encounter for antineoplastic chemotherapy: Secondary | ICD-10-CM | POA: Diagnosis not present

## 2017-12-27 DIAGNOSIS — Z803 Family history of malignant neoplasm of breast: Secondary | ICD-10-CM | POA: Diagnosis not present

## 2017-12-27 DIAGNOSIS — C50412 Malignant neoplasm of upper-outer quadrant of left female breast: Secondary | ICD-10-CM | POA: Insufficient documentation

## 2017-12-27 DIAGNOSIS — G44309 Post-traumatic headache, unspecified, not intractable: Secondary | ICD-10-CM | POA: Diagnosis not present

## 2017-12-27 DIAGNOSIS — Z923 Personal history of irradiation: Secondary | ICD-10-CM | POA: Insufficient documentation

## 2017-12-27 DIAGNOSIS — I700 Atherosclerosis of aorta: Secondary | ICD-10-CM | POA: Insufficient documentation

## 2017-12-27 DIAGNOSIS — Y9389 Activity, other specified: Secondary | ICD-10-CM | POA: Insufficient documentation

## 2017-12-27 DIAGNOSIS — Z79899 Other long term (current) drug therapy: Secondary | ICD-10-CM | POA: Insufficient documentation

## 2017-12-27 DIAGNOSIS — Z170 Estrogen receptor positive status [ER+]: Secondary | ICD-10-CM | POA: Insufficient documentation

## 2017-12-27 DIAGNOSIS — I2510 Atherosclerotic heart disease of native coronary artery without angina pectoris: Secondary | ICD-10-CM

## 2017-12-27 DIAGNOSIS — Z853 Personal history of malignant neoplasm of breast: Secondary | ICD-10-CM | POA: Diagnosis not present

## 2017-12-27 DIAGNOSIS — R918 Other nonspecific abnormal finding of lung field: Secondary | ICD-10-CM | POA: Insufficient documentation

## 2017-12-27 DIAGNOSIS — Z95828 Presence of other vascular implants and grafts: Secondary | ICD-10-CM

## 2017-12-27 DIAGNOSIS — Z79818 Long term (current) use of other agents affecting estrogen receptors and estrogen levels: Secondary | ICD-10-CM | POA: Insufficient documentation

## 2017-12-27 DIAGNOSIS — Z510 Encounter for antineoplastic radiation therapy: Secondary | ICD-10-CM | POA: Diagnosis not present

## 2017-12-27 DIAGNOSIS — J439 Emphysema, unspecified: Secondary | ICD-10-CM

## 2017-12-27 DIAGNOSIS — C50919 Malignant neoplasm of unspecified site of unspecified female breast: Secondary | ICD-10-CM | POA: Diagnosis not present

## 2017-12-27 DIAGNOSIS — I10 Essential (primary) hypertension: Secondary | ICD-10-CM | POA: Diagnosis not present

## 2017-12-27 DIAGNOSIS — D122 Benign neoplasm of ascending colon: Secondary | ICD-10-CM | POA: Diagnosis not present

## 2017-12-27 DIAGNOSIS — C787 Secondary malignant neoplasm of liver and intrahepatic bile duct: Secondary | ICD-10-CM | POA: Insufficient documentation

## 2017-12-27 DIAGNOSIS — R51 Headache: Secondary | ICD-10-CM | POA: Diagnosis not present

## 2017-12-27 DIAGNOSIS — K769 Liver disease, unspecified: Secondary | ICD-10-CM

## 2017-12-27 DIAGNOSIS — E785 Hyperlipidemia, unspecified: Secondary | ICD-10-CM | POA: Diagnosis not present

## 2017-12-27 DIAGNOSIS — K7460 Unspecified cirrhosis of liver: Secondary | ICD-10-CM | POA: Insufficient documentation

## 2017-12-27 DIAGNOSIS — Y999 Unspecified external cause status: Secondary | ICD-10-CM | POA: Diagnosis not present

## 2017-12-27 LAB — COMPREHENSIVE METABOLIC PANEL
ALT: 37 U/L (ref 14–54)
AST: 48 U/L — ABNORMAL HIGH (ref 15–41)
Albumin: 4 g/dL (ref 3.5–5.0)
Alkaline Phosphatase: 126 U/L (ref 38–126)
Anion gap: 8 (ref 5–15)
BILIRUBIN TOTAL: 1 mg/dL (ref 0.3–1.2)
BUN: 25 mg/dL — ABNORMAL HIGH (ref 6–20)
CALCIUM: 9.1 mg/dL (ref 8.9–10.3)
CHLORIDE: 103 mmol/L (ref 101–111)
CO2: 24 mmol/L (ref 22–32)
CREATININE: 0.79 mg/dL (ref 0.44–1.00)
Glucose, Bld: 106 mg/dL — ABNORMAL HIGH (ref 65–99)
Potassium: 4.3 mmol/L (ref 3.5–5.1)
Sodium: 135 mmol/L (ref 135–145)
TOTAL PROTEIN: 8.3 g/dL — AB (ref 6.5–8.1)

## 2017-12-27 LAB — CBC WITH DIFFERENTIAL/PLATELET
BASOS ABS: 0 10*3/uL (ref 0–0.1)
BASOS PCT: 0 %
EOS ABS: 0 10*3/uL (ref 0–0.7)
Eosinophils Relative: 0 %
HCT: 40.8 % (ref 35.0–47.0)
Hemoglobin: 14 g/dL (ref 12.0–16.0)
LYMPHS ABS: 0.5 10*3/uL — AB (ref 1.0–3.6)
Lymphocytes Relative: 11 %
MCH: 32.7 pg (ref 26.0–34.0)
MCHC: 34.5 g/dL (ref 32.0–36.0)
MCV: 95 fL (ref 80.0–100.0)
Monocytes Absolute: 0.1 10*3/uL — ABNORMAL LOW (ref 0.2–0.9)
Monocytes Relative: 3 %
NEUTROS PCT: 86 %
Neutro Abs: 4.2 10*3/uL (ref 1.4–6.5)
PLATELETS: 159 10*3/uL (ref 150–440)
RBC: 4.29 MIL/uL (ref 3.80–5.20)
RDW: 15 % — ABNORMAL HIGH (ref 11.5–14.5)
WBC: 4.9 10*3/uL (ref 3.6–11.0)

## 2017-12-27 MED ORDER — IOPAMIDOL (ISOVUE-300) INJECTION 61%
100.0000 mL | Freq: Once | INTRAVENOUS | Status: AC | PRN
  Administered 2017-12-27: 100 mL via INTRAVENOUS

## 2017-12-27 MED ORDER — TRAMADOL HCL 50 MG PO TABS: 50.0000 mg | ORAL_TABLET | Freq: Four times a day (QID) | ORAL | 0 refills | Status: DC | PRN

## 2017-12-27 MED ORDER — HEPARIN SOD (PORK) LOCK FLUSH 100 UNIT/ML IV SOLN
500.0000 [IU] | INTRAVENOUS | Status: AC | PRN
  Administered 2017-12-27: 500 [IU]

## 2017-12-27 MED ORDER — SODIUM CHLORIDE 0.9% FLUSH
10.0000 mL | INTRAVENOUS | Status: AC | PRN
  Administered 2017-12-27: 10 mL
  Filled 2017-12-27: qty 10

## 2017-12-27 MED ORDER — BACLOFEN 10 MG PO TABS: 10.0000 mg | ORAL_TABLET | Freq: Three times a day (TID) | ORAL | 0 refills | Status: DC
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
## 2017-12-28 ENCOUNTER — Ambulatory Visit
Admission: RE | Admit: 2017-12-28 | Discharge: 2017-12-28 | Disposition: A | Discharge status: Home / Self Care | Payer: BLUE CROSS/BLUE SHIELD | Source: Ambulatory Visit | Attending: Radiation Oncology | Admitting: Radiation Oncology

## 2017-12-28 ENCOUNTER — Telehealth: Payer: Self-pay

## 2017-12-28 DIAGNOSIS — Z170 Estrogen receptor positive status [ER+]: Secondary | ICD-10-CM | POA: Diagnosis not present

## 2017-12-28 DIAGNOSIS — Z510 Encounter for antineoplastic radiation therapy: Secondary | ICD-10-CM | POA: Diagnosis not present

## 2017-12-28 DIAGNOSIS — C50412 Malignant neoplasm of upper-outer quadrant of left female breast: Secondary | ICD-10-CM | POA: Diagnosis not present

## 2017-12-28 LAB — CANCER ANTIGEN 15-3: CAN 15 3: 14.9 U/mL (ref 0.0–25.0)

## 2017-12-28 LAB — CANCER ANTIGEN 27.29: CA 27.29: 23.1 U/mL (ref 0.0–38.6)
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
## 2017-12-29 ENCOUNTER — Ambulatory Visit
Admission: RE | Admit: 2017-12-29 | Discharge: 2017-12-29 | Disposition: A | Discharge status: Home / Self Care | Payer: BLUE CROSS/BLUE SHIELD | Source: Ambulatory Visit | Attending: Radiation Oncology | Admitting: Radiation Oncology

## 2017-12-29 ENCOUNTER — Inpatient Hospital Stay (HOSPITAL_BASED_OUTPATIENT_CLINIC_OR_DEPARTMENT_OTHER): Payer: BLUE CROSS/BLUE SHIELD | Admitting: Internal Medicine

## 2017-12-29 ENCOUNTER — Encounter: Payer: Self-pay | Admitting: Internal Medicine

## 2017-12-29 ENCOUNTER — Inpatient Hospital Stay: Payer: BLUE CROSS/BLUE SHIELD

## 2017-12-29 VITALS — BP 142/97 | HR 96 | Temp 97.9°F | Resp 16 | Wt 168.6 lb

## 2017-12-29 DIAGNOSIS — M8580 Other specified disorders of bone density and structure, unspecified site: Secondary | ICD-10-CM

## 2017-12-29 DIAGNOSIS — C773 Secondary and unspecified malignant neoplasm of axilla and upper limb lymph nodes: Secondary | ICD-10-CM | POA: Diagnosis not present

## 2017-12-29 DIAGNOSIS — Z79899 Other long term (current) drug therapy: Secondary | ICD-10-CM | POA: Diagnosis not present

## 2017-12-29 DIAGNOSIS — Z79818 Long term (current) use of other agents affecting estrogen receptors and estrogen levels: Secondary | ICD-10-CM | POA: Diagnosis not present

## 2017-12-29 DIAGNOSIS — C7931 Secondary malignant neoplasm of brain: Secondary | ICD-10-CM

## 2017-12-29 DIAGNOSIS — E785 Hyperlipidemia, unspecified: Secondary | ICD-10-CM | POA: Diagnosis not present

## 2017-12-29 DIAGNOSIS — Z853 Personal history of malignant neoplasm of breast: Secondary | ICD-10-CM

## 2017-12-29 DIAGNOSIS — I10 Essential (primary) hypertension: Secondary | ICD-10-CM | POA: Diagnosis not present

## 2017-12-29 DIAGNOSIS — Z923 Personal history of irradiation: Secondary | ICD-10-CM

## 2017-12-29 DIAGNOSIS — Z170 Estrogen receptor positive status [ER+]: Secondary | ICD-10-CM | POA: Diagnosis not present

## 2017-12-29 DIAGNOSIS — Z803 Family history of malignant neoplasm of breast: Secondary | ICD-10-CM

## 2017-12-29 DIAGNOSIS — E860 Dehydration: Secondary | ICD-10-CM

## 2017-12-29 DIAGNOSIS — F17210 Nicotine dependence, cigarettes, uncomplicated: Secondary | ICD-10-CM | POA: Diagnosis not present

## 2017-12-29 DIAGNOSIS — Z9221 Personal history of antineoplastic chemotherapy: Secondary | ICD-10-CM | POA: Diagnosis not present

## 2017-12-29 DIAGNOSIS — R918 Other nonspecific abnormal finding of lung field: Secondary | ICD-10-CM

## 2017-12-29 DIAGNOSIS — C50412 Malignant neoplasm of upper-outer quadrant of left female breast: Secondary | ICD-10-CM

## 2017-12-29 DIAGNOSIS — I252 Old myocardial infarction: Secondary | ICD-10-CM | POA: Diagnosis not present

## 2017-12-29 DIAGNOSIS — C787 Secondary malignant neoplasm of liver and intrahepatic bile duct: Secondary | ICD-10-CM

## 2017-12-29 DIAGNOSIS — Z510 Encounter for antineoplastic radiation therapy: Secondary | ICD-10-CM | POA: Diagnosis not present

## 2017-12-29 MED ORDER — FULVESTRANT 250 MG/5ML IM SOLN
500.0000 mg | Freq: Once | INTRAMUSCULAR | Status: AC
  Administered 2017-12-29: 500 mg via INTRAMUSCULAR
  Filled 2017-12-29: qty 10
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
## 2017-12-29 NOTE — Assessment & Plan Note (Addendum)
#  RECURRENT Metastatic breast cancer- ER/PR positive HER-2/neu negative; CT scan April 2019-shows stable liver metastases [except for 10 mm increased conspicuity of lesion]; resolution of the prior groundglass opacity right upper lobe [see discussion below]; however MRI of the brain-shows leptomeningeal metastases [see discussion below].  #With regards to systemic disease/extracranial given the overall stable disease-continue Faslodex+ abema 50 mg/day [secondary to severe neutropenia/pancytopenia]   # 4 cm RUL ground glass- ? Etiology-likely inflammatory; currently resolved on the most recent CT scan  # Brain metastases-recurrent/likely leptomeningeal disease; reviewed MRI results in detail.  I would not recommend LP at this time.  Continue whole brain radiation at this time  # PN G-1/ improving secondary to chemotherapy. Continue Lyrica; Continue oxycodone once a day as needed.   #Faslodex today follow-up in 4 weeks labs/MD/Faslodex.  #  Discussed with breast navigators regarding transportation issues.  # I reviewed the blood work- with the patient in detail; also reviewed the imaging independently [as summarized above]; and with the patient in detail.   # 40 minutes face-to-face with the patient discussing the above plan of care; more than 50% of time spent on prognosis/ natural history; counseling and coordination.

## 2017-12-30 ENCOUNTER — Other Ambulatory Visit: Payer: BLUE CROSS/BLUE SHIELD

## 2017-12-30 ENCOUNTER — Ambulatory Visit
Admission: RE | Admit: 2017-12-30 | Discharge: 2017-12-30 | Disposition: A | Discharge status: Home / Self Care | Payer: BLUE CROSS/BLUE SHIELD | Source: Ambulatory Visit | Attending: Radiation Oncology | Admitting: Radiation Oncology

## 2017-12-30 DIAGNOSIS — Z510 Encounter for antineoplastic radiation therapy: Secondary | ICD-10-CM | POA: Diagnosis not present

## 2017-12-30 DIAGNOSIS — C50412 Malignant neoplasm of upper-outer quadrant of left female breast: Secondary | ICD-10-CM | POA: Diagnosis not present

## 2017-12-30 DIAGNOSIS — Z170 Estrogen receptor positive status [ER+]: Secondary | ICD-10-CM | POA: Diagnosis not present

## 2017-12-31 ENCOUNTER — Ambulatory Visit
Admission: RE | Admit: 2017-12-31 | Discharge: 2017-12-31 | Disposition: A | Discharge status: Home / Self Care | Payer: BLUE CROSS/BLUE SHIELD | Source: Ambulatory Visit | Attending: Radiation Oncology | Admitting: Radiation Oncology

## 2017-12-31 DIAGNOSIS — Z170 Estrogen receptor positive status [ER+]: Secondary | ICD-10-CM | POA: Diagnosis not present

## 2017-12-31 DIAGNOSIS — C50412 Malignant neoplasm of upper-outer quadrant of left female breast: Secondary | ICD-10-CM | POA: Diagnosis not present

## 2017-12-31 DIAGNOSIS — Z510 Encounter for antineoplastic radiation therapy: Secondary | ICD-10-CM | POA: Diagnosis not present

## 2018-01-03 ENCOUNTER — Ambulatory Visit
Admission: RE | Admit: 2018-01-03 | Discharge: 2018-01-03 | Disposition: A | Discharge status: Home / Self Care | Payer: BLUE CROSS/BLUE SHIELD | Source: Ambulatory Visit | Attending: Radiation Oncology | Admitting: Radiation Oncology

## 2018-01-03 DIAGNOSIS — Z510 Encounter for antineoplastic radiation therapy: Secondary | ICD-10-CM | POA: Diagnosis not present

## 2018-01-03 DIAGNOSIS — C50412 Malignant neoplasm of upper-outer quadrant of left female breast: Secondary | ICD-10-CM | POA: Diagnosis not present

## 2018-01-03 DIAGNOSIS — Z170 Estrogen receptor positive status [ER+]: Secondary | ICD-10-CM | POA: Diagnosis not present

## 2018-01-04 ENCOUNTER — Ambulatory Visit
Admission: RE | Admit: 2018-01-04 | Discharge: 2018-01-04 | Disposition: A | Discharge status: Home / Self Care | Payer: BLUE CROSS/BLUE SHIELD | Source: Ambulatory Visit | Attending: Radiation Oncology | Admitting: Radiation Oncology

## 2018-01-04 DIAGNOSIS — Z170 Estrogen receptor positive status [ER+]: Secondary | ICD-10-CM | POA: Diagnosis not present

## 2018-01-04 DIAGNOSIS — Z510 Encounter for antineoplastic radiation therapy: Secondary | ICD-10-CM | POA: Diagnosis not present

## 2018-01-04 DIAGNOSIS — C50412 Malignant neoplasm of upper-outer quadrant of left female breast: Secondary | ICD-10-CM | POA: Diagnosis not present

## 2018-01-05 ENCOUNTER — Ambulatory Visit
Admission: RE | Admit: 2018-01-05 | Discharge: 2018-01-05 | Disposition: A | Discharge status: Home / Self Care | Payer: BLUE CROSS/BLUE SHIELD | Source: Ambulatory Visit | Attending: Radiation Oncology | Admitting: Radiation Oncology

## 2018-01-05 DIAGNOSIS — C773 Secondary and unspecified malignant neoplasm of axilla and upper limb lymph nodes: Secondary | ICD-10-CM | POA: Diagnosis not present

## 2018-01-05 DIAGNOSIS — C50412 Malignant neoplasm of upper-outer quadrant of left female breast: Secondary | ICD-10-CM | POA: Diagnosis not present

## 2018-01-05 DIAGNOSIS — Z170 Estrogen receptor positive status [ER+]: Secondary | ICD-10-CM | POA: Diagnosis not present

## 2018-01-05 DIAGNOSIS — C7931 Secondary malignant neoplasm of brain: Secondary | ICD-10-CM | POA: Diagnosis not present

## 2018-01-05 DIAGNOSIS — Z510 Encounter for antineoplastic radiation therapy: Secondary | ICD-10-CM | POA: Diagnosis not present

## 2018-01-06 ENCOUNTER — Ambulatory Visit
Admission: RE | Admit: 2018-01-06 | Discharge: 2018-01-06 | Disposition: A | Discharge status: Home / Self Care | Payer: BLUE CROSS/BLUE SHIELD | Source: Ambulatory Visit | Attending: Radiation Oncology | Admitting: Radiation Oncology

## 2018-01-06 DIAGNOSIS — C50412 Malignant neoplasm of upper-outer quadrant of left female breast: Secondary | ICD-10-CM | POA: Diagnosis not present

## 2018-01-06 DIAGNOSIS — Z170 Estrogen receptor positive status [ER+]: Secondary | ICD-10-CM | POA: Diagnosis not present

## 2018-01-06 DIAGNOSIS — Z510 Encounter for antineoplastic radiation therapy: Secondary | ICD-10-CM | POA: Diagnosis not present

## 2018-01-07 ENCOUNTER — Ambulatory Visit
Admission: RE | Admit: 2018-01-07 | Discharge: 2018-01-07 | Disposition: A | Discharge status: Home / Self Care | Payer: BLUE CROSS/BLUE SHIELD | Source: Ambulatory Visit | Attending: Radiation Oncology | Admitting: Radiation Oncology

## 2018-01-07 DIAGNOSIS — Z170 Estrogen receptor positive status [ER+]: Secondary | ICD-10-CM | POA: Diagnosis not present

## 2018-01-07 DIAGNOSIS — Z510 Encounter for antineoplastic radiation therapy: Secondary | ICD-10-CM | POA: Diagnosis not present

## 2018-01-07 DIAGNOSIS — C50412 Malignant neoplasm of upper-outer quadrant of left female breast: Secondary | ICD-10-CM | POA: Diagnosis not present

## 2018-01-10 ENCOUNTER — Telehealth: Payer: Self-pay | Admitting: Internal Medicine

## 2018-01-10 ENCOUNTER — Telehealth: Payer: Self-pay | Admitting: Pharmacist

## 2018-01-10 DIAGNOSIS — C50412 Malignant neoplasm of upper-outer quadrant of left female breast: Secondary | ICD-10-CM

## 2018-01-10 DIAGNOSIS — Z170 Estrogen receptor positive status [ER+]: Principal | ICD-10-CM

## 2018-01-10 MED ORDER — CAPECITABINE 500 MG PO TABS: 1000.0000 mg/m2 | ORAL_TABLET | Freq: Two times a day (BID) | ORAL | 2 refills | Status: DC

## 2018-01-10 MED ORDER — CAPECITABINE 500 MG PO TABS: 1000.0000 mg/m2 | ORAL_TABLET | Freq: Two times a day (BID) | ORAL | 4 refills | Status: DC

## 2018-01-10 NOTE — Telephone Encounter (Signed)
Oral Oncology Pharmacist Encounter  Received new prescription for Xeloda (capecitabine) for the treatment of metastatic breast cancer, planned duration until disease progression or unacceptable drug toxicity.  CBC/CMP from 12/27/17 assessed, no relevant lab abnormalities. Prescription dose and frequency assessed.   Current medication list in Epic reviewed, no relevant DDIs with Xeloda identified.  Prescription has been e-scribed to the [facility] for benefits analysis and approval.  Oral Oncology Clinic will continue to follow for insurance authorization, copayment issues, initial counseling and start date.  Darl Pikes, PharmD, BCPS Hematology/Oncology Clinical Pharmacist [facility] [phone]  01/10/2018 4:00 PM

## 2018-01-10 NOTE — Telephone Encounter (Signed)
Sarah Horne- I have not previously spoken to pt re: xeloda.   So, please inform patient that I am looking into the option of Xeloda; however would wait to figure out her coverage/financial for changing treatment.  I would definitely speak to her if I decide to change treatment/at the next visit in 2 weeks.  So essentially no change in the treatment; until that she is me on May 1 as planned. Pt NOT to start xeloda if if approved until next viist  Dr.B

## 2018-01-11 ENCOUNTER — Telehealth: Payer: Self-pay | Admitting: Pharmacy Technician

## 2018-01-11 NOTE — Telephone Encounter (Signed)
Oral Oncology Patient Advocate Encounter  Received notification from [employer] that prior authorization for Xeloda is required.  PA submitted on CoverMyMeds Key [id number] Status is pending  Oral Oncology Clinic will continue to follow.  Fabio Asa. Melynda Keller, [place] Patient [place] [phone] 01/11/2018 9:25 AM

## 2018-01-13 NOTE — Telephone Encounter (Signed)
Oral Chemotherapy Pharmacist Encounter   Received fax stating that the patient's capecitebine had been approved.   Darl Pikes, PharmD, BCPS Hematology/Oncology Clinical Pharmacist [facility] [phone]  01/13/2018 4:04 PM

## 2018-01-14 MED ORDER — CAPECITABINE 500 MG PO TABS: 1000.0000 mg/m2 | ORAL_TABLET | Freq: Two times a day (BID) | ORAL | 2 refills | Status: DC

## 2018-01-17 ENCOUNTER — Telehealth: Payer: Self-pay | Admitting: Pharmacist

## 2018-01-17 ENCOUNTER — Ambulatory Visit: Payer: BLUE CROSS/BLUE SHIELD | Admitting: Nurse Practitioner

## 2018-01-17 NOTE — Telephone Encounter (Signed)
Oral Chemotherapy Pharmacist Encounter   Called [facility] to check on the status of Sarah Horne's Xeloda (capecitabine) prescription. They stated that it was still being processed. I asked that they place the prescription on STAT status.   Darl Pikes, PharmD, BCPS Hematology/Oncology Clinical Pharmacist [facility] [phone]  01/17/2018 11:21 AM

## 2018-01-18 ENCOUNTER — Encounter: Payer: Self-pay | Admitting: Nurse Practitioner

## 2018-01-18 ENCOUNTER — Ambulatory Visit (INDEPENDENT_AMBULATORY_CARE_PROVIDER_SITE_OTHER): Payer: BLUE CROSS/BLUE SHIELD | Admitting: Nurse Practitioner

## 2018-01-18 ENCOUNTER — Telehealth: Payer: Self-pay | Admitting: Nurse Practitioner

## 2018-01-18 VITALS — BP 138/76 | HR 100 | Temp 99.0°F | Resp 16 | Ht 66.0 in | Wt 162.5 lb

## 2018-01-18 DIAGNOSIS — R067 Sneezing: Principal | ICD-10-CM

## 2018-01-18 DIAGNOSIS — H04209 Unspecified epiphora, unspecified lacrimal gland: Secondary | ICD-10-CM

## 2018-01-18 DIAGNOSIS — S91114A Laceration without foreign body of right lesser toe(s) without damage to nail, initial encounter: Secondary | ICD-10-CM

## 2018-01-18 DIAGNOSIS — B351 Tinea unguium: Secondary | ICD-10-CM

## 2018-01-18 DIAGNOSIS — H60553 Acute reactive otitis externa, bilateral: Secondary | ICD-10-CM | POA: Diagnosis not present

## 2018-01-18 DIAGNOSIS — Z23 Encounter for immunization: Secondary | ICD-10-CM

## 2018-01-18 MED ORDER — LEVOCETIRIZINE DIHYDROCHLORIDE 5 MG PO TABS: 5.0000 mg | ORAL_TABLET | Freq: Every evening | ORAL | 3 refills | Status: DC

## 2018-01-18 MED ORDER — HYDROCORTISONE-ACETIC ACID 1-2 % OT SOLN: 2.0000 [drp] | Freq: Every day | OTIC | 0 refills | Status: DC | PRN

## 2018-01-18 NOTE — Progress Notes (Addendum)
Name: Sarah Horne   MRN: [id number]    DOB: 02/16/1956   Date:01/18/2018       Progress Note  Subjective  Chief Complaint  Chief Complaint  Patient presents with  . Eye Problem    itcy, watery, crusty eyes in the mornings    HPI  Bilateral eye watering, mild itching and left eye is crusted in the morning. Endorses rhinorrhea. Ongoing for a week. No vision changes, eye pain, sore throat, cough. Patient states bilateral inner ear is raw- dry and itchy, denies pain or discharge. Does not use qtips  Pt also endorses bilateral feet pain and coldness states pain has been ongoing since chemo 2016- was told neuropathy from chemo. Sts coldness is newer issue noticed    Patient has thick toe nails sts due to chemo/radiation uses blade to cut toenails and accidentally cut self. Healed.   Patient Active Problem List   Diagnosis Date Noted  . Brain metastasis ([place]) 05/19/2017  . Blood in stool   . Polyp of sigmoid colon   . Benign neoplasm of transverse colon   . Benign neoplasm of ascending colon   . Lower GI bleed   . Weakness of right lower extremity 02/11/2017  . Orthostatic lightheadedness 01/14/2017  . Atrial tachycardia ([place]) 01/14/2017  . Hypokalemia 01/14/2017  . Pulmonary emboli ([place]) 01/12/2017  . Dehydration 12/25/2016  . Blood loss anemia 12/15/2016  . Ulceration of colon   . Heartburn   . Duodenitis   . Gastritis without bleeding   . GI bleed   . Acute esophagitis   . Acute esophagogastric ulcer   . Rectal bleed   . Hypovolemic shock ([place])   . Ischemic leg 12/04/2016  . Chemotherapy induced neutropenia ([place]) 11/20/2016  . Palliative care by specialist   . Goals of care, counseling/discussion   . DNR (do not resuscitate) discussion   . Cerebral edema ([place]) 11/10/2016  . Encounter for monitoring cardiotoxic drug therapy 11/09/2016  . Elevated LFTs 10/20/2016  . Peripheral neuropathy due to chemotherapy ([place]) 03/13/2016  . Encounter for antineoplastic  chemotherapy 03/13/2016  . HLD (hyperlipidemia) 01/29/2016  . Hyperglycemia, unspecified 01/29/2016  . Cardiomyopathy ([place]) 01/13/2016  . Hypertension 07/18/2015  . Carcinoma of upper-outer quadrant of left breast in female, estrogen receptor positive ([place]) 12/27/2014    Past Medical History:  Diagnosis Date  . Chemotherapy-induced peripheral neuropathy ([place])   . Epistaxis    a. 11/2016 in setting of asa/plavix-->silver nitrate cauterization.  . GI bleed    a. 11/2016 Admission w/ GIB and hypovolemic shock req 3u PRBC's;  b. 11/2016 ECG: gastritis & nonbleeding peptic ulcer; c. 11/2016 Conlonoscopy: rectal and sigmoid colonic ulcers.  . Heart attack ([place])    a. 1998 Cath @ [facility]: reportedly no intervention required.  Marland Kitchen Herceptin-induced cardiomyopathy ([place])    a. In the setting of Herceptin Rx for breast cancer (initiated 12/2014); b. 03/2015 MUGA EF 64%; b. 08/2015 MUGA: EF 51%; c. 10/2015 MUGA: EF 44%; d. 11/2015 Echo: EF 45-50%; e. 01/2016 MUGA: EF 60%; f. 06/2016 MUGA EF 65%; g. 10/2016 MUGA: EF 61%;  h. 12/2016 Echo: EF 55-60%, gr1 DD.  Marland Kitchen Hyperlipidemia   . Hypertension   . Neuropathy   . Possible PAD (peripheral artery disease) ([place])    a. 11/2016 LE cyanosis and weak pulses-->CTA w/o significant Ao-BiFem dzs. ? distal dzs-->ASA/Plavix initiated by vascular surgery.  Marland Kitchen PSVT (paroxysmal supraventricular tachycardia) ([place])    a. Dx 11/2016.  . Pulmonary embolism ([place])  a. 12/2016 CTA Chest: small nonocclusive PE in inferior segment of the Left lingula, somewhat eccentric filling defect suggesting chronic rather than acute embolic event; b. 09/8839 LE U/S:  No DVT; c. 12/2016 Echo: Nl RV fxn, nl PASP.  Marland Kitchen Recurrent Metastatic breast cancer ([place])    a. Dx 2016: Stage II, ER positive, PR positive, HER-2/neu overexpressing of the left breast-->chemo/radiation; b. 10/2016 CT Abd/pelvis: diffuse liver mets, ill defined sclerotic bone lesions-T12;  c. 10/2016 MRI brain: metastatic lesion along L temporal lobe  (19x10m) w/ extensive surrounding edema & 587mmidline shift to right.  . Sinus tachycardia     Past Surgical History:  Procedure Laterality Date  . BREAST BIOPSY Left 2016   Positive  . BREAST LUMPECTOMY WITH SENTINEL LYMPH NODE BIOPSY Left 05/23/2015   Procedure: LEFT BREAST WIDE EXCISION WITH AXILLARY DISSECTION, MASTOPLASTY ;  Surgeon: JeRobert BellowMD;  Location: [facility] ORS;  Service: General;  Laterality: Left;  . BREAST SURGERY Left 12/18/14   breast biopsy/INVASIVE DUCTAL CARCINOMA OF BREAST, NOTTINGHAM GRADE 2.  . Marland KitchenREAST SURGERY  05/23/2015.   Wide excision/mastoplasty, axillary dissection. No residual invasive cancer, positive for residual DCIS. 0/2 nodes identified on axillary dissection. (no SLN by technetium or methylene blue)  . CARDIAC CATHETERIZATION    . COLONOSCOPY WITH PROPOFOL N/A 12/10/2016   Procedure: COLONOSCOPY WITH PROPOFOL;  Surgeon: DaLucilla LameMD;  Location: [facility] END[facility]COPY;  Service: Endoscopy;  Laterality: N/A;  . COLONOSCOPY WITH PROPOFOL N/A 02/13/2017   Procedure: COLONOSCOPY WITH PROPOFOL;  Surgeon: WoLucilla LameMD;  [facility];  Service: Endoscopy;  Laterality: N/A;  . ESOPHAGOGASTRODUODENOSCOPY (EGD) WITH PROPOFOL N/A 12/08/2016   Procedure: ESOPHAGOGASTRODUODENOSCOPY (EGD) WITH PROPOFOL;  Surgeon: DaLucilla LameMD;  Location: [facility];  Service: Endoscopy;  Laterality: N/A;  . IVC FILTER INSERTION N/A 02/15/2017   Procedure: IVC Filter Insertion;  Surgeon: DeAlgernon HuxleyMD;  [facility];  Service: Cardiovascular;  Laterality: N/A;  . PORTACATH PLACEMENT Right 12-31-14   Dr ByBary Castilla  Social History   Tobacco Use  . Smoking status: Light Tobacco Smoker    Packs/day: 0.50    Years: 18.00    Pack years: 9.00    Types: Cigarettes  . Smokeless tobacco: Never Used  Substance Use Topics  . Alcohol use: No    Alcohol/week: 0.0 oz     Current Outpatient Medications:  .  baclofen (LIORESAL) 10 MG tablet, Take 1  tablet (10 mg total) by mouth 3 (three) times daily., Disp: 30 tablet, Rfl: 0 .  capecitabine (XELODA) 500 MG tablet, Take 4 tablets (2,000 mg total) by mouth 2 (two) times daily after a meal. Take for 1 week-ON then 1 Week-OFF, Disp: 112 tablet, Rfl: 2 .  dexamethasone (DECADRON) 4 MG tablet, Take 1 tablet (4 mg total) by mouth daily., Disp: 20 tablet, Rfl: 0 .  ergocalciferol (VITAMIN D2) 50000 units capsule, Take 1 capsule (50,000 Units total) once a week by mouth., Disp: 12 capsule, Rfl: 1 .  feeding supplement, ENSURE ENLIVE, (ENSURE ENLIVE) LIQD, Take 237 mLs by mouth 2 (two) times daily between meals., Disp: 237 mL, Rfl: 12 .  flecainide (TAMBOCOR) 50 MG tablet, Take 1 tablet (50 mg total) by mouth 2 (two) times daily., Disp: 60 tablet, Rfl: 11 .  fluticasone (FLONASE) 50 MCG/ACT nasal spray, Place 2 sprays into both nostrils daily., Disp: 16 g, Rfl: 0 .  magnesium oxide (MAG-OX) 400 MG tablet, Take 1 tablet (400 mg total) by  mouth 2 (two) times daily., Disp: 60 tablet, Rfl: 6 .  oxyCODONE (OXY IR/ROXICODONE) 5 MG immediate release tablet, Take 1 tablet (5 mg total) by mouth every 8 (eight) hours as needed for severe pain., Disp: 42 tablet, Rfl: 0 .  potassium chloride (KLOR-CON) 20 MEQ packet, Take by mouth 2 (two) times daily., Disp: , Rfl:  .  pregabalin (LYRICA) 75 MG capsule, Take 1 capsule (75 mg total) by mouth 2 (two) times daily., Disp: 60 capsule, Rfl: 3 .  traMADol (ULTRAM) 50 MG tablet, Take 1 tablet (50 mg total) by mouth every 6 (six) hours as needed., Disp: 12 tablet, Rfl: 0 .  VERZENIO 50 MG tablet, TAKE ONE TABLET (50 MG TOTAL) BY MOUTH ONCE DAILY FOR 21 DAYS THEN OFF FOR 7 DAYS, Disp: 28 tablet, Rfl: PRN No current facility-administered medications for this visit.   Facility-Administered Medications Ordered in Other Visits:  .  0.9 %  sodium chloride infusion, , Intravenous, Continuous, Pandit, Sandeep, MD .  0.9 %  sodium chloride infusion, , Intravenous, Continuous,  Finnegan, Kathlene November, MD, Last Rate: 999 mL/hr at 04/24/15 1520 .  heparin lock flush 100 unit/mL, 500 Units, Intravenous, Once, Berenzon, Dmitriy, MD .  sodium chloride 0.9 % injection 10 mL, 10 mL, Intravenous, PRN, Ma Hillock, Sandeep, MD, 10 mL at 04/04/15 1440 .  sodium chloride flush (NS) 0.9 % injection 10 mL, 10 mL, Intravenous, PRN, Berenzon, Dmitriy, MD .  Tbo-Filgrastim [facility]) injection 480 mcg, 480 mcg, Subcutaneous, Once, Cammie Sickle, MD  Allergies  Allergen Reactions  . No Known Allergies     ROS Negative systems (except as listed in HPI above).  Objective  Vitals:   01/18/18 1320  BP: 138/76  Pulse: 100  Resp: 16  Temp: 99 F (37.2 C)  TempSrc: Oral  SpO2: 97%  Weight: 162 lb 8 oz (73.7 kg)  Height: 5' 6"  (1.676 m)    Body mass index is 26.23 kg/m.  Nursing Note and Vital Signs reviewed.  Physical Exam  HENT:  Head: Normocephalic and atraumatic.  Right Ear: Tympanic membrane and external ear normal. No drainage, swelling or tenderness.  Left Ear: Tympanic membrane and external ear normal. No drainage, swelling or tenderness.  Nose: Nose normal. No mucosal edema.  Mouth/Throat: Oropharynx is clear and moist and mucous membranes are normal.  Bilateral ear canals dry, red pt endorses mild pain upon otoscopic exam   Eyes: Pupils are equal, round, and reactive to light. Conjunctivae, EOM and lids are normal. Right eye exhibits no discharge, no exudate and no hordeolum. No foreign body present in the right eye. Left eye exhibits no discharge and no exudate. No foreign body present in the left eye.    Constitutional: Patient appears well-developed and well-nourished.  No distress.  Cardiovascular: Normal rate, regular rhythm, S1/S2 present.  No murmur or rub heard. Pulses intact, cap refill 1-2 seconds, feet are cool.  Pulmonary/Chest: Effort normal and breath sounds clear. No respiratory distress or retractions. Skin: skin intact in foot, large toe nail  thick and yellow. Left hand middle finger nail not present, skin hard over area.  Psychiatric: Patient has a normal mood and affect. behavior is normal. Judgment and thought content normal.  No results found for this or any previous visit (from the past 72 hour(s)).  Assessment & Plan  1. Onychomycosis - do not use blade to cute nails  - Ambulatory referral to Podiatry  2. Sneezing with watery eyes - levocetirizine (XYZAL) 5 MG tablet;  Take 1 tablet (5 mg total) by mouth every evening.  Dispense: 30 tablet; Refill: 3  3. Acute reactive otitis externa of both ears - acetic acid-hydrocortisone (VOSOL-HC) OTIC solution; Place 2 drops into both ears daily as needed.  Dispense: 10 mL; Refill: 0  4. Laceration of third toe of right foot, initial encounter - Tdap vaccine greater than or equal to 7yo IM  Discussed talking to oncologist to about radiation side effect treatments as well.  -Red flags and when to present for emergency care or RTC including fever >101.70F, chest pain, shortness of breath, new/worsening/un-resolving symptoms,  reviewed with patient at time of visit. Follow up and care instructions discussed and provided in AVS. -------------------------------- I have reviewed this encounter including the documentation in this note and/or discussed this patient with the provider, Suezanne Cheshire DNP AGNP-C. I am certifying that I agree with the content of this note as supervising physician. Enid Derry, [facility] 02/15/2018, 8:04 PM

## 2018-01-18 NOTE — Patient Instructions (Addendum)
-   do not cut your toe nails blade  Allergic Conjunctivitis A clear membrane (conjunctiva) covers the white part of your eye and the inner surface of your eyelid. Allergic conjunctivitis happens when this membrane has inflammation. This is caused by allergies. Common causes of allergic reactions (allergens)include:  Outdoor allergens, such as: ? Pollen. ? Grass and weeds. ? Mold spores.  Indoor allergens, such as: ? Dust. ? Smoke. ? Mold. ? Pet dander. ? Animal hair.  This condition can make your eye red or pink. It can also make your eye feel itchy. This condition cannot be spread from one person to another person (is not contagious). Follow these instructions at home:  Try not to be around things that you are allergic to.  Take or apply over-the-counter and prescription medicines only as told by your doctor. These include any eye drops.  Place a cool, clean washcloth on your eye for 10-20 minutes. Do this 3-4 times a day.  Do not touch or rub your eyes.  Do not wear contact lenses until the inflammation is gone. Wear glasses instead.  Do not wear eye makeup until the inflammation is gone.  Keep all follow-up visits as told by your doctor. This is important. Contact a doctor if:  Your symptoms get worse.  Your symptoms do not get better with treatment.  You have mild eye pain.  You are sensitive to light,  You have spots or blisters on your eyes.  You have pus coming from your eye.  You have a fever. Get help right away if:  You have redness, swelling, or other symptoms in only one eye.  Your vision is blurry.  You have vision changes.  You have very bad eye pain. Summary  Allergic conjunctivitis is caused by allergies. It can make your eye red or pink, and it can make your eye feel itchy.  This condition cannot be spread from one person to another person (is not contagious).  Try not to be around things that you are allergic to.  Take or apply  over-the-counter and prescription medicines only as told by your doctor. These include any eye drops.  Contact your doctor if your symptoms get worse or they do not get better with treatment. This information is not intended to replace advice given to you by your health care provider. Make sure you discuss any questions you have with your health care provider. Document Released: 03/04/2010 Document Revised: 05/08/2016 Document Reviewed: 05/08/2016 Elsevier Interactive Patient Education  2017 [employer].

## 2018-01-18 NOTE — Telephone Encounter (Signed)
Oral Chemotherapy Pharmacist Encounter   [facility] has processed Ms. Chaney's Rx for capecitabine. Her copay with be $15.   Called Ms. Cashin to update her on the prescription so that she knows to expect a call from [facility]. She knows NOT to start the new medication until she is instructed to begin by Dr. Rogue Bussing. Instructed herto bring her new medication with her to her next appt.   Darl Pikes, PharmD, BCPS Hematology/Oncology Clinical Pharmacist [facility] [phone]  01/18/2018 2:35 PM

## 2018-01-18 NOTE — Telephone Encounter (Signed)
Copied from [place] [phone]. Topic: Quick Communication - See Telephone Encounter >> Jan 18, 2018  3:41 PM Aurelio Brash B wrote: CRM for notification. See Telephone encounter for: 01/18/18.  PT was seen in office today and told she would have eye drops and ear drops called in,  she got ear drops but eye drops were not called in   [id number] - [place] [place] - [address] [phone] (Phone) [phone] (Fax)

## 2018-01-19 NOTE — Telephone Encounter (Signed)
Please send in script

## 2018-01-20 NOTE — Telephone Encounter (Signed)
Pt says she did not get rx for   levocetirizine 5 MG tablet  Commonly known as: XYZAL  Take 1 tablet (5 mg total) by mouth every evening.      [id number] , [place] - [address] [phone] (Phone) [phone] (Fax)

## 2018-01-26 ENCOUNTER — Other Ambulatory Visit: Payer: Self-pay

## 2018-01-26 ENCOUNTER — Inpatient Hospital Stay: Payer: BLUE CROSS/BLUE SHIELD

## 2018-01-26 ENCOUNTER — Encounter: Payer: Self-pay | Admitting: Internal Medicine

## 2018-01-26 ENCOUNTER — Inpatient Hospital Stay: Payer: BLUE CROSS/BLUE SHIELD | Attending: Internal Medicine

## 2018-01-26 ENCOUNTER — Other Ambulatory Visit: Payer: Self-pay | Admitting: Nurse Practitioner

## 2018-01-26 ENCOUNTER — Inpatient Hospital Stay (HOSPITAL_BASED_OUTPATIENT_CLINIC_OR_DEPARTMENT_OTHER): Payer: BLUE CROSS/BLUE SHIELD | Admitting: Internal Medicine

## 2018-01-26 VITALS — BP 122/80 | HR 82 | Temp 98.2°F | Resp 18 | Ht 66.0 in | Wt 166.0 lb

## 2018-01-26 DIAGNOSIS — Z9221 Personal history of antineoplastic chemotherapy: Secondary | ICD-10-CM | POA: Insufficient documentation

## 2018-01-26 DIAGNOSIS — I252 Old myocardial infarction: Secondary | ICD-10-CM

## 2018-01-26 DIAGNOSIS — Z79818 Long term (current) use of other agents affecting estrogen receptors and estrogen levels: Secondary | ICD-10-CM | POA: Diagnosis not present

## 2018-01-26 DIAGNOSIS — M8580 Other specified disorders of bone density and structure, unspecified site: Secondary | ICD-10-CM | POA: Insufficient documentation

## 2018-01-26 DIAGNOSIS — Z170 Estrogen receptor positive status [ER+]: Secondary | ICD-10-CM

## 2018-01-26 DIAGNOSIS — B958 Unspecified staphylococcus as the cause of diseases classified elsewhere: Secondary | ICD-10-CM | POA: Diagnosis not present

## 2018-01-26 DIAGNOSIS — Z79899 Other long term (current) drug therapy: Secondary | ICD-10-CM

## 2018-01-26 DIAGNOSIS — I10 Essential (primary) hypertension: Secondary | ICD-10-CM

## 2018-01-26 DIAGNOSIS — C7931 Secondary malignant neoplasm of brain: Secondary | ICD-10-CM

## 2018-01-26 DIAGNOSIS — Z8719 Personal history of other diseases of the digestive system: Secondary | ICD-10-CM | POA: Diagnosis not present

## 2018-01-26 DIAGNOSIS — E785 Hyperlipidemia, unspecified: Secondary | ICD-10-CM

## 2018-01-26 DIAGNOSIS — F17210 Nicotine dependence, cigarettes, uncomplicated: Secondary | ICD-10-CM | POA: Insufficient documentation

## 2018-01-26 DIAGNOSIS — Z86711 Personal history of pulmonary embolism: Secondary | ICD-10-CM | POA: Diagnosis not present

## 2018-01-26 DIAGNOSIS — C787 Secondary malignant neoplasm of liver and intrahepatic bile duct: Secondary | ICD-10-CM | POA: Insufficient documentation

## 2018-01-26 DIAGNOSIS — C50412 Malignant neoplasm of upper-outer quadrant of left female breast: Secondary | ICD-10-CM

## 2018-01-26 DIAGNOSIS — L03116 Cellulitis of left lower limb: Secondary | ICD-10-CM | POA: Diagnosis not present

## 2018-01-26 DIAGNOSIS — Z803 Family history of malignant neoplasm of breast: Secondary | ICD-10-CM

## 2018-01-26 LAB — CBC WITH DIFFERENTIAL/PLATELET
Basophils Absolute: 0 10*3/uL (ref 0–0.1)
Basophils Relative: 0 %
EOS ABS: 0 10*3/uL (ref 0–0.7)
EOS PCT: 2 %
HCT: 39.6 % (ref 35.0–47.0)
Hemoglobin: 13.6 g/dL (ref 12.0–16.0)
LYMPHS ABS: 0.6 10*3/uL — AB (ref 1.0–3.6)
LYMPHS PCT: 20 %
MCH: 33.5 pg (ref 26.0–34.0)
MCHC: 34.4 g/dL (ref 32.0–36.0)
MCV: 97.4 fL (ref 80.0–100.0)
MONO ABS: 0.2 10*3/uL (ref 0.2–0.9)
Monocytes Relative: 6 %
Neutro Abs: 2 10*3/uL (ref 1.4–6.5)
Neutrophils Relative %: 72 %
PLATELETS: 138 10*3/uL — AB (ref 150–440)
RBC: 4.07 MIL/uL (ref 3.80–5.20)
RDW: 14.9 % — ABNORMAL HIGH (ref 11.5–14.5)
WBC: 2.8 10*3/uL — ABNORMAL LOW (ref 3.6–11.0)

## 2018-01-26 LAB — COMPREHENSIVE METABOLIC PANEL
ALBUMIN: 3.3 g/dL — AB (ref 3.5–5.0)
ALT: 36 U/L (ref 14–54)
AST: 37 U/L (ref 15–41)
Alkaline Phosphatase: 121 U/L (ref 38–126)
Anion gap: 9 (ref 5–15)
BUN: 14 mg/dL (ref 6–20)
CHLORIDE: 104 mmol/L (ref 101–111)
CO2: 26 mmol/L (ref 22–32)
CREATININE: 0.9 mg/dL (ref 0.44–1.00)
Calcium: 9.3 mg/dL (ref 8.9–10.3)
GFR calc Af Amer: >60 mL/min (ref >60)
GLUCOSE: 114 mg/dL — AB (ref 65–99)
Potassium: 3.8 mmol/L (ref 3.5–5.1)
SODIUM: 139 mmol/L (ref 135–145)
Total Bilirubin: 0.6 mg/dL (ref 0.3–1.2)
Total Protein: 7.2 g/dL (ref 6.5–8.1)

## 2018-01-26 NOTE — Telephone Encounter (Signed)
Please send script

## 2018-01-26 NOTE — Assessment & Plan Note (Addendum)
#  RECURRENT Metastatic breast cancer- ER/PR positive HER-2/neu negative; CT scan April 2019-shows stable liver metastases [except for 10 mm increased conspicuity of lesion]; [see discussion below]; however MRI of the brain-shows leptomeningeal metastases [see discussion below].  #I recommend discontinuation of current therapy [Faslodex+ abema 50 mg/day]-given the progression noted in the liver/and also the leptomeningeal disease [see discussion below]  #I would recommend starting Xeloda 4 pills twice a day; 1 week on and one-week off.  Patient should have good brain penetration also [see discussion below].  Again reviewed the potential side effects of diarrhea hand-foot syndrome etc.  Patient understands treatments are palliative not curative.  # Brain metastases-recurrent/likely leptomeningeal disease;  S/p WBRT [April 2019]; Xeloda should also have blood-brain barrier  # PN G-1/ improving secondary to chemotherapy. Continue Lyrica; Continue oxycodone once a day as needed.    #Follow-up on 20 May/labs.   # 40 minutes face-to-face with the patient discussing the above plan of care; more than 50% of time spent on prognosis/ natural history; counseling and coordination.

## 2018-01-26 NOTE — Telephone Encounter (Signed)
levocetirizine (XYZAL) 5 MG tablet 30 tablet 3 01/18/2018    Sig - Route: Take 1 tablet (5 mg total) by mouth every evening. - Oral   Sent to pharmacy as: levocetirizine (XYZAL) 5 MG tablet   E-Prescribing Status: Receipt confirmed by pharmacy (01/18/2018 1:58 PM EDT)    Please have patient check with pharmacy- cautioning reordering as recepit of confirmation from 4/23 sent from [facility] [place] [id number] [address]

## 2018-01-26 NOTE — Patient Instructions (Signed)
#  Start taking Xeloda starting Monday [facility] 6]; stop Verzinio today.

## 2018-01-26 NOTE — Progress Notes (Signed)
[place] OFFICE PROGRESS NOTE  Patient Care Team: Steele Sizer, MD as PCP - General (Family Medicine) Bary Castilla, Forest Gleason, MD (General Surgery) [name], MD (Inactive) as Attending Physician (Internal Medicine)  No matching staging information was found for the patient.   Oncology History   # LEFT BREAST IDC; STAGE II [cT2N1] ER >90%; PR- 50-90%; her 2 NEU- POS; s/p Neoadj chemo; AUG 2016- TCH+P s/p Lumpec & partial ALND- path CR; s/p RT [finished Nov 2016];  adj Herceptin; HELD for Jan 19th 2017 [in DEC 2016-EF dropped from 63 to 51%; FEB 27th EF-42.9%]; JAN 2016 START Arimidex; May 2017- EF- 60%; May 24th 2017-Re-start Herceptin q 3W; July 28th STOP herceptin [finished 68m/m2; sec to Low EF; however 2017 Oct EF= 67%; improved]  # DEC 4th 2017- Start Neratinib 4 pills; DEC 11th 3 pills; STOPPED.  # FEB 2018- RECURRENCE ER/PR positive; Her 2 NEGATIVE [liver Bx]  # MARCH 1st 2018- Tax-Cytoxan [poor tol]  # FEB 2018- Brain mets [SBRT; Dr.Crystal; finished Feb 28th 2018]  # March 2018- Eribulin- poor tolerance  # June 8th 2018- Started Faslodex + Abema [abema-multiple interruptions sec to neutropenia]  # MRI Brain- March 2019- Leptomeningeal mets [WBRT; No LP s/p WBRT- 01/09/2018]; STOP Abema [580mday-sec to severe cytopenia]+faslodex  # MAY 1st 2019- Start Xeloda    # PN- G-2; may 2017- Cymbalta 6051m;MARCH 2018-  acute vascular insuff of BIL LE [? Taxotere vasoconstriction on asprin]; # hemorragic shock LGIB [s/p colo; Dr.Wohl]  #  SVT- on flecainide.   # April 2016- Liver Bx- NEG;   # Drop in EF from Herceptin [recovered OCt 27th- EF 65%]  # BMD- jan 2017- osteopenia  -----------------------------------------------------------     [place]- Sep 4th 2018- PDL-1 0%/NEG for BRCA; MSI-Stable; Positive for PI3K; ccnd-1; FGF**     Carcinoma of upper-outer quadrant of left breast in female, estrogen receptor positive ([place]    INTERVAL HISTORY:  [age]ar old female patient with upper history of recurrent stage IV ER PR positive HER-2/neu negative breast cancer-currently on Faslodex-Abema is here for follow-up.   Patient is currently post whole brain radiation for leptomeningeal disease.  Patient denies any headaches.  Denies any nausea vomiting.  Denies any vision changes.   Appetite is good.  No swelling in the legs.  No abdominal distention.  No shortness of breath or cough.   REVIEW OF SYSTEMS:  A complete 10 point review of system is done which is negative except mentioned above/history of present illness.   PAST MEDICAL HISTORY :  Past Medical History:  Diagnosis Date  . Chemotherapy-induced peripheral neuropathy ([place] . Epistaxis    a. 11/2016 in setting of asa/plavix-->silver nitrate cauterization.  . GI bleed    a. 11/2016 Admission w/ GIB and hypovolemic shock req 3u PRBC's;  b. 11/2016 ECG: gastritis & nonbleeding peptic ulcer; c. 11/2016 Conlonoscopy: rectal and sigmoid colonic ulcers.  . Heart attack ([facility]  a. 1998 Cath @ [facility]: reportedly no intervention required.  . HMarland Kitchenrceptin-induced cardiomyopathy ([place]  a. In the setting of Herceptin Rx for breast cancer (initiated 12/2014); b. 03/2015 MUGA EF 64%; b. 08/2015 MUGA: EF 51%; c. 10/2015 MUGA: EF 44%; d. 11/2015 Echo: EF 45-50%; e. 01/2016 MUGA: EF 60%; f. 06/2016 MUGA EF 65%; g. 10/2016 MUGA: EF 61%;  h. 12/2016 Echo: EF 55-60%, gr1 DD.  . HMarland Kitchenperlipidemia   . Hypertension   . Neuropathy   . Possible PAD (peripheral  artery disease) ([place])    a. 11/2016 LE cyanosis and weak pulses-->CTA w/o significant Ao-BiFem dzs. ? distal dzs-->ASA/Plavix initiated by vascular surgery.  Marland Kitchen PSVT (paroxysmal supraventricular tachycardia) ([place])    a. Dx 11/2016.  . Pulmonary embolism ([place])    a. 12/2016 CTA Chest: small nonocclusive PE in inferior segment of the Left lingula, somewhat eccentric filling defect suggesting chronic rather than acute embolic event; b.  09/8297 LE U/S:  No DVT; c. 12/2016 Echo: Nl RV fxn, nl PASP.  Marland Kitchen Recurrent Metastatic breast cancer ([place])    a. Dx 2016: Stage II, ER positive, PR positive, HER-2/neu overexpressing of the left breast-->chemo/radiation; b. 10/2016 CT Abd/pelvis: diffuse liver mets, ill defined sclerotic bone lesions-T12;  c. 10/2016 MRI brain: metastatic lesion along L temporal lobe (19x42m) w/ extensive surrounding edema & 535mmidline shift to right.  . Sinus tachycardia     PAST SURGICAL HISTORY :   Past Surgical History:  Procedure Laterality Date  . BREAST BIOPSY Left 2016   Positive  . BREAST LUMPECTOMY WITH SENTINEL LYMPH NODE BIOPSY Left 05/23/2015   Procedure: LEFT BREAST WIDE EXCISION WITH AXILLARY DISSECTION, MASTOPLASTY ;  Surgeon: JeRobert BellowMD;  Location: [facility] ORS;  Service: General;  Laterality: Left;  . BREAST SURGERY Left 12/18/14   breast biopsy/INVASIVE DUCTAL CARCINOMA OF BREAST, NOTTINGHAM GRADE 2.  . Marland KitchenREAST SURGERY  05/23/2015.   Wide excision/mastoplasty, axillary dissection. No residual invasive cancer, positive for residual DCIS. 0/2 nodes identified on axillary dissection. (no SLN by technetium or methylene blue)  . CARDIAC CATHETERIZATION    . COLONOSCOPY WITH PROPOFOL N/A 12/10/2016   Procedure: COLONOSCOPY WITH PROPOFOL;  Surgeon: DaLucilla LameMD;  Location: [facility] ENDOSCOPY;  Service: Endoscopy;  Laterality: N/A;  . COLONOSCOPY WITH PROPOFOL N/A 02/13/2017   Procedure: COLONOSCOPY WITH PROPOFOL;  Surgeon: WoLucilla LameMD;  [facility];  Service: Endoscopy;  Laterality: N/A;  . ESOPHAGOGASTRODUODENOSCOPY (EGD) WITH PROPOFOL N/A 12/08/2016   Procedure: ESOPHAGOGASTRODUODENOSCOPY (EGD) WITH PROPOFOL;  Surgeon: DaLucilla LameMD;  Location: [facility] ENDOSCOPY;  Service: Endoscopy;  Laterality: N/A;  . IVC FILTER INSERTION N/A 02/15/2017   Procedure: IVC Filter Insertion;  Surgeon: DeAlgernon HuxleyMD;  [facility];  Service: Cardiovascular;  Laterality: N/A;  .  PORTACATH PLACEMENT Right 12-31-14   Dr ByBary Castilla  FAMILY HISTORY :   Family History  Problem Relation Age of Onset  . Breast cancer Maternal Aunt   . Breast cancer Cousin   . Brain cancer Maternal Uncle   . Diabetes Mother   . Hypertension Mother   . Stroke Mother     SOCIAL HISTORY:   Social History   Tobacco Use  . Smoking status: Light Tobacco Smoker    Packs/day: 0.50    Years: 18.00    Pack years: 9.00    Types: Cigarettes  . Smokeless tobacco: Never Used  Substance Use Topics  . Alcohol use: No    Alcohol/week: 0.0 oz  . Drug use: No    ALLERGIES:  is allergic to no known allergies.  MEDICATIONS:  Current Outpatient Medications  Medication Sig Dispense Refill  . baclofen (LIORESAL) 10 MG tablet Take 1 tablet (10 mg total) by mouth 3 (three) times daily. 30 tablet 0  . dexamethasone (DECADRON) 4 MG tablet Take 1 tablet (4 mg total) by mouth daily. 20 tablet 0  . feeding supplement, ENSURE ENLIVE, (ENSURE ENLIVE) LIQD Take 237 mLs by mouth 2 (two) times daily between meals. 23[place]  mL 12  . flecainide (TAMBOCOR) 50 MG tablet Take 1 tablet (50 mg total) by mouth 2 (two) times daily. 60 tablet 11  . fluticasone (FLONASE) 50 MCG/ACT nasal spray Place 2 sprays into both nostrils daily. 16 g 0  . levocetirizine (XYZAL) 5 MG tablet Take 1 tablet (5 mg total) by mouth every evening. 30 tablet 3  . magnesium oxide (MAG-OX) 400 MG tablet Take 1 tablet (400 mg total) by mouth 2 (two) times daily. 60 tablet 6  . potassium chloride (KLOR-CON) 20 MEQ packet Take by mouth 2 (two) times daily.    . pregabalin (LYRICA) 75 MG capsule Take 1 capsule (75 mg total) by mouth 2 (two) times daily. 60 capsule 3  . traMADol (ULTRAM) 50 MG tablet Take 1 tablet (50 mg total) by mouth every 6 (six) hours as needed. 12 tablet 0  . VERZENIO 50 MG tablet TAKE ONE TABLET (50 MG TOTAL) BY MOUTH ONCE DAILY FOR 21 DAYS THEN OFF FOR 7 DAYS 28 tablet PRN  . acetic acid-hydrocortisone (VOSOL-HC) OTIC  solution Place 2 drops into both ears daily as needed. 10 mL 0  . capecitabine (XELODA) 500 MG tablet Take 4 tablets (2,000 mg total) by mouth 2 (two) times daily after a meal. Take for 1 week-ON then 1 Week-OFF 112 tablet 2  . doxycycline (VIBRA-TABS) 100 MG tablet Take 1 tablet (100 mg total) by mouth 2 (two) times daily for 10 days. 20 tablet 0  . oxyCODONE (OXY IR/ROXICODONE) 5 MG immediate release tablet Take 1 tablet (5 mg total) by mouth every 8 (eight) hours as needed for severe pain. 42 tablet 0  . Vitamin D, Ergocalciferol, (DRISDOL) 50000 units CAPS capsule TAKE 1 CAPSULE (50,000 UNITS TOTAL) ONCE A WEEK BY MOUTH. 12 capsule 1   No current facility-administered medications for this visit.    Facility-Administered Medications Ordered in Other Visits  Medication Dose Route Frequency Provider Last Rate Last Dose  . 0.9 %  sodium chloride infusion   Intravenous Continuous Pandit, Sandeep, MD      . 0.9 %  sodium chloride infusion   Intravenous Continuous Lloyd Huger, MD 999 mL/hr at 04/24/15 1520    . heparin lock flush 100 unit/mL  500 Units Intravenous Once Berenzon, Dmitriy, MD      . sodium chloride 0.9 % injection 10 mL  10 mL Intravenous PRN [name], MD   10 mL at 04/04/15 1440  . sodium chloride flush (NS) 0.9 % injection 10 mL  10 mL Intravenous PRN Berenzon, Dmitriy, MD      . Tbo-Filgrastim (GRANIX) injection 480 mcg  480 mcg Subcutaneous Once Cammie Sickle, MD        PHYSICAL EXAMINATION: ECOG PERFORMANCE STATUS: 0 - Asymptomatic  BP 122/80   Pulse 82   Temp 98.2 F (36.8 C) (Oral)   Resp 18   Ht _0  (1.676 m)   Wt 166 lb (75.3 kg)   BMI 26.79 kg/m   Filed Weights   01/26/18 1034  Weight: 166 lb (75.3 kg)    GENERAL: Well-nourished well-developed; Alert, no distress and comfortable.  She is alone. EYES: no pallor. No jaundice. She is in a wheelchair  OROPHARYNX: No  ulceration; good dentition ; NECK: supple, no masses felt LYMPH:  no  palpable lymphadenopathy in the cervical, axillary or inguinal regions LUNGS: clear to auscultation and  No wheeze or crackles HEART/CVS: regular rate & rhythm and no murmurs; NO bilateral lower extremity edema.  ABDOMEN:abdomen soft, non-tender and normal bowel sounds Musculoskeletal:no cyanosis of digits and no clubbing  PSYCH: alert & oriented x 3 with fluent speech NEURO: no focal motor/sensory deficits.    LABORATORY DATA:  I have reviewed the data as listed    Component Value Date/Time   NA 139 01/26/2018 1010   NA 135 03/04/2017 0918   NA 135 01/08/2015 0850   K 3.8 01/26/2018 1010   K 3.7 01/08/2015 0850   CL 104 01/26/2018 1010   CL 101 01/08/2015 0850   CO2 26 01/26/2018 1010   CO2 26 01/08/2015 0850   GLUCOSE 114 (H) 01/26/2018 1010   GLUCOSE 161 (H) 01/08/2015 0850   BUN 14 01/26/2018 1010   BUN 8 03/04/2017 0918   BUN 17 01/08/2015 0850   CREATININE 0.90 01/26/2018 1010   CREATININE 0.82 01/22/2015 1559   CALCIUM 9.3 01/26/2018 1010   CALCIUM 9.3 01/08/2015 0850   PROT 7.2 01/26/2018 1010   PROT 6.4 03/04/2017 0918   PROT 6.8 01/22/2015 1559   ALBUMIN 3.3 (L) 01/26/2018 1010   ALBUMIN 2.5 (L) 03/04/2017 0918   ALBUMIN 3.9 01/22/2015 1559   AST 37 01/26/2018 1010   AST 34 01/22/2015 1559   ALT 36 01/26/2018 1010   ALT 42 01/22/2015 1559   ALKPHOS 121 01/26/2018 1010   ALKPHOS 157 (H) 01/22/2015 1559   BILITOT 0.6 01/26/2018 1010   BILITOT 6.2 (H) 03/04/2017 0918   BILITOT 0.2 (L) 01/22/2015 1559   GFRNONAA >60 01/26/2018 1010   GFRNONAA >60 01/22/2015 1559   GFRAA >60 01/26/2018 1010   GFRAA >60 01/22/2015 1559    No results found for: SPEP, UPEP  Lab Results  Component Value Date   WBC 2.8 (L) 01/26/2018   NEUTROABS 2.0 01/26/2018   HGB 13.6 01/26/2018   HCT 39.6 01/26/2018   MCV 97.4 01/26/2018   PLT 138 (L) 01/26/2018      Chemistry      Component Value Date/Time   NA 139 01/26/2018 1010   NA 135 03/04/2017 0918   NA 135 01/08/2015  0850   K 3.8 01/26/2018 1010   K 3.7 01/08/2015 0850   CL 104 01/26/2018 1010   CL 101 01/08/2015 0850   CO2 26 01/26/2018 1010   CO2 26 01/08/2015 0850   BUN 14 01/26/2018 1010   BUN 8 03/04/2017 0918   BUN 17 01/08/2015 0850   CREATININE 0.90 01/26/2018 1010   CREATININE 0.82 01/22/2015 1559      Component Value Date/Time   CALCIUM 9.3 01/26/2018 1010   CALCIUM 9.3 01/08/2015 0850   ALKPHOS 121 01/26/2018 1010   ALKPHOS 157 (H) 01/22/2015 1559   AST 37 01/26/2018 1010   AST 34 01/22/2015 1559   ALT 36 01/26/2018 1010   ALT 42 01/22/2015 1559   BILITOT 0.6 01/26/2018 1010   BILITOT 6.2 (H) 03/04/2017 0918   BILITOT 0.2 (L) 01/22/2015 1559            LABORATORY DATA:  I have reviewed the data as listed    Component Value Date/Time   NA 139 01/26/2018 1010   NA 135 03/04/2017 0918   NA 135 01/08/2015 0850   K 3.8 01/26/2018 1010   K 3.7 01/08/2015 0850   CL 104 01/26/2018 1010   CL 101 01/08/2015 0850   CO2 26 01/26/2018 1010   CO2 26 01/08/2015 0850   GLUCOSE 114 (H) 01/26/2018 1010   GLUCOSE 161 (H) 01/08/2015 0850   BUN 14  01/26/2018 1010   BUN 8 03/04/2017 0918   BUN 17 01/08/2015 0850   CREATININE 0.90 01/26/2018 1010   CREATININE 0.82 01/22/2015 1559   CALCIUM 9.3 01/26/2018 1010   CALCIUM 9.3 01/08/2015 0850   PROT 7.2 01/26/2018 1010   PROT 6.4 03/04/2017 0918   PROT 6.8 01/22/2015 1559   ALBUMIN 3.3 (L) 01/26/2018 1010   ALBUMIN 2.5 (L) 03/04/2017 0918   ALBUMIN 3.9 01/22/2015 1559   AST 37 01/26/2018 1010   AST 34 01/22/2015 1559   ALT 36 01/26/2018 1010   ALT 42 01/22/2015 1559   ALKPHOS 121 01/26/2018 1010   ALKPHOS 157 (H) 01/22/2015 1559   BILITOT 0.6 01/26/2018 1010   BILITOT 6.2 (H) 03/04/2017 0918   BILITOT 0.2 (L) 01/22/2015 1559   GFRNONAA >60 01/26/2018 1010   GFRNONAA >60 01/22/2015 1559   GFRAA >60 01/26/2018 1010   GFRAA >60 01/22/2015 1559    No results found for: SPEP, UPEP  Lab Results  Component Value Date    WBC 2.8 (L) 01/26/2018   NEUTROABS 2.0 01/26/2018   HGB 13.6 01/26/2018   HCT 39.6 01/26/2018   MCV 97.4 01/26/2018   PLT 138 (L) 01/26/2018      Chemistry      Component Value Date/Time   NA 139 01/26/2018 1010   NA 135 03/04/2017 0918   NA 135 01/08/2015 0850   K 3.8 01/26/2018 1010   K 3.7 01/08/2015 0850   CL 104 01/26/2018 1010   CL 101 01/08/2015 0850   CO2 26 01/26/2018 1010   CO2 26 01/08/2015 0850   BUN 14 01/26/2018 1010   BUN 8 03/04/2017 0918   BUN 17 01/08/2015 0850   CREATININE 0.90 01/26/2018 1010   CREATININE 0.82 01/22/2015 1559      Component Value Date/Time   CALCIUM 9.3 01/26/2018 1010   CALCIUM 9.3 01/08/2015 0850   ALKPHOS 121 01/26/2018 1010   ALKPHOS 157 (H) 01/22/2015 1559   AST 37 01/26/2018 1010   AST 34 01/22/2015 1559   ALT 36 01/26/2018 1010   ALT 42 01/22/2015 1559   BILITOT 0.6 01/26/2018 1010   BILITOT 6.2 (H) 03/04/2017 0918   BILITOT 0.2 (L) 01/22/2015 1559     IMPRESSION: 1. Generally reduced size of observed liver lesions, with the exception of a single 10 mm liver lesion demonstrating some increased conspicuity. 2. No new osseous metastatic lesions are identified. There is some mild sclerosis superiorly in the T12 vertebral body, slightly less conspicuous than previous, as well as some stable faint sclerosis in the right inferior pubic ramus. These could reflect treated residua of prior osseous metastatic disease. 3. The previous ground-glass opacity in the right upper lobe has resolved. 4. Other imaging findings of potential clinical significance: Aortic Atherosclerosis (ICD10-I70.0) and Emphysema (ICD10-J43.9). Coronary atherosclerosis. Chronic interstitial accentuation at the left lung apex. Cirrhosis. IVC filter. Calcified uterine fibroids.   Electronically Signed   By: Van Clines M.D.   On: 12/27/2017 13:33  ASSESSMENT & PLAN:  Carcinoma of upper-outer quadrant of left breast in female, estrogen  receptor positive ([place]) # RECURRENT Metastatic breast cancer- ER/PR positive HER-2/neu negative; CT scan April 2019-shows stable liver metastases [except for 10 mm increased conspicuity of lesion]; [see discussion below]; however MRI of the brain-shows leptomeningeal metastases [see discussion below].  #I recommend discontinuation of current therapy [Faslodex+ abema 50 mg/day]-given the progression noted in the liver/and also the leptomeningeal disease [see discussion below]  #I would recommend starting Xeloda 4  pills twice a day; 1 week on and one-week off.  Patient should have good brain penetration also [see discussion below].  Again reviewed the potential side effects of diarrhea hand-foot syndrome etc.  Patient understands treatments are palliative not curative.  # Brain metastases-recurrent/likely leptomeningeal disease;  S/p WBRT [April 2019]; Xeloda should also have blood-brain barrier  # PN G-1/ improving secondary to chemotherapy. Continue Lyrica; Continue oxycodone once a day as needed.    #Follow-up on 20 May/labs.   # 40 minutes face-to-face with the patient discussing the above plan of care; more than 50% of time spent on prognosis/ natural history; counseling and coordination.     Orders Placed This Encounter  Procedures  . CBC with Differential/Platelet    Standing Status:   Future    Standing Expiration Date:   01/27/2019  . Comprehensive metabolic panel    Standing Status:   Future    Standing Expiration Date:   01/27/2019   All questions were answered. The patient knows to call the clinic with any problems, questions or concerns.      Cammie Sickle, MD 02/08/2018 3:49 PM

## 2018-01-27 LAB — CANCER ANTIGEN 27.29: CAN 27.29: 17.2 U/mL (ref 0.0–38.6)

## 2018-01-31 ENCOUNTER — Other Ambulatory Visit: Payer: Self-pay | Admitting: Internal Medicine

## 2018-01-31 ENCOUNTER — Encounter: Payer: Self-pay | Admitting: Nurse Practitioner

## 2018-01-31 ENCOUNTER — Ambulatory Visit (INDEPENDENT_AMBULATORY_CARE_PROVIDER_SITE_OTHER): Payer: BLUE CROSS/BLUE SHIELD | Admitting: Nurse Practitioner

## 2018-01-31 ENCOUNTER — Encounter: Payer: Self-pay | Admitting: Podiatry

## 2018-01-31 ENCOUNTER — Ambulatory Visit: Payer: BLUE CROSS/BLUE SHIELD | Admitting: Podiatry

## 2018-01-31 VITALS — BP 109/75 | HR 104

## 2018-01-31 VITALS — BP 124/70 | HR 101 | Temp 98.6°F | Resp 16 | Ht 66.0 in | Wt 167.2 lb

## 2018-01-31 DIAGNOSIS — L03119 Cellulitis of unspecified part of limb: Secondary | ICD-10-CM | POA: Diagnosis not present

## 2018-01-31 DIAGNOSIS — B351 Tinea unguium: Secondary | ICD-10-CM

## 2018-01-31 DIAGNOSIS — M79674 Pain in right toe(s): Secondary | ICD-10-CM

## 2018-01-31 DIAGNOSIS — L02419 Cutaneous abscess of limb, unspecified: Secondary | ICD-10-CM | POA: Diagnosis not present

## 2018-01-31 DIAGNOSIS — M79675 Pain in left toe(s): Secondary | ICD-10-CM

## 2018-01-31 DIAGNOSIS — Q828 Other specified congenital malformations of skin: Secondary | ICD-10-CM | POA: Diagnosis not present

## 2018-01-31 DIAGNOSIS — D489 Neoplasm of uncertain behavior, unspecified: Secondary | ICD-10-CM | POA: Diagnosis not present

## 2018-01-31 NOTE — Patient Instructions (Signed)
- referral to dermatology for back skin lesion - Wound culture for left leg and then antibiotics. - Use warm compress for left leg and elevate it when you are resting. Will prescribe antibiotics when culture comes back, if any worsening will order earlier. If you have fever, chills, lots of drainage or red streaking up your leg seek immediate medical attention    Skin Abscess A skin abscess is an infected area on or under your skin that contains a collection of pus and other material. An abscess may also be called a furuncle, carbuncle, or boil. An abscess can occur in or on almost any part of your body. Some abscesses break open (rupture) on their own. Most continue to get worse unless they are treated. The infection can spread deeper into the body and eventually into your blood, which can make you feel ill. Treatment usually involves draining the abscess. What are the causes? An abscess occurs when germs, often bacteria, pass through your skin and cause an infection. This may be caused by:  A scrape or cut on your skin.  A puncture wound through your skin, including a needle injection.  Blocked oil or sweat glands.  Blocked and infected hair follicles.  A cyst that forms beneath your skin (sebaceous cyst) and becomes infected.  What increases the risk? This condition is more likely to develop in people who:  Have a weak body defense system (immune system).  Have diabetes.  Have dry and irritated skin.  Get frequent injections or use illegal IV drugs.  Have a foreign body in a wound, such as a splinter.  Have problems with their lymph system or veins.  What are the signs or symptoms? An abscess may start as a painful, firm bump under the skin. Over time, the abscess may get larger or become softer. Pus may appear at the top of the abscess, causing pressure and pain. It may eventually break through the skin and drain. Other symptoms  include:  Redness.  Warmth.  Swelling.  Tenderness.  A sore on the skin.  How is this diagnosed? This condition is diagnosed based on your medical history and a physical exam. A sample of pus may be taken from the abscess to find out what is causing the infection and what antibiotics can be used to treat it. You also may have:  Blood tests to look for signs of infection or spread of an infection to your blood.  Imaging studies such as ultrasound, CT scan, or MRI if the abscess is deep.  How is this treated? Small abscesses that drain on their own may not need treatment. Treatment for an abscess that does not rupture on its own may include:  Warm compresses applied to the area several times per day.  Incision and drainage. Your health care provider will make an incision to open the abscess and will remove pus and any foreign body or dead tissue. The incision area may be packed with gauze to keep it open for a few days while it heals.  Antibiotic medicines to treat infection. For a severe abscess, you may first get antibiotics through an IV and then change to oral antibiotics.  Follow these instructions at home: Abscess Care  If you have an abscess that has not drained, place a warm, clean, wet washcloth over the abscess several times a day. Do this as told by your health care provider.  Follow instructions from your health care provider about how to take care of your  abscess. Make sure you: ? Cover the abscess with a bandage (dressing). ? Change your dressing or gauze as told by your health care provider. ? Wash your hands with soap and water before you change the dressing or gauze. If soap and water are not available, use hand sanitizer.  Check your abscess every day for signs of a worsening infection. Check for: ? More redness, swelling, or pain. ? More fluid or blood. ? Warmth. ? More pus or a bad smell. Medicines  Take over-the-counter and prescription medicines only  as told by your health care provider.  If you were prescribed an antibiotic medicine, take it as told by your health care provider. Do not stop taking the antibiotic even if you start to feel better. General instructions  To avoid spreading the infection: ? Do not share personal care items, towels, or hot tubs with others. ? Avoid making skin contact with other people.  Keep all follow-up visits as told by your health care provider. This is important. Contact a health care provider if:  You have more redness, swelling, or pain around your abscess.  You have more fluid or blood coming from your abscess.  Your abscess feels warm to the touch.  You have more pus or a bad smell coming from your abscess.  You have a fever.  You have muscle aches.  You have chills or a general ill feeling. Get help right away if:  You have severe pain.  You see red streaks on your skin spreading away from the abscess. This information is not intended to replace advice given to you by your health care provider. Make sure you discuss any questions you have with your health care provider. Document Released: 06/24/2005 Document Revised: 05/10/2016 Document Reviewed: 07/24/2015 Elsevier Interactive Patient Education  2018 [employer]. Cellulitis, Adult Cellulitis is a skin infection. The infected area is usually red and sore. This condition occurs most often in the arms and lower legs. It is very important to get treated for this condition. Follow these instructions at home:  Take over-the-counter and prescription medicines only as told by your doctor.  If you were prescribed an antibiotic medicine, take it as told by your doctor. Do not stop taking the antibiotic even if you start to feel better.  Drink enough fluid to keep your pee (urine) clear or pale yellow.  Do not touch or rub the infected area.  Raise (elevate) the infected area above the level of your heart while you are sitting or lying  down.  Place warm or cold wet cloths (warm or cold compresses) on the infected area. Do this as told by your doctor.  Keep all follow-up visits as told by your doctor. This is important. These visits let your doctor make sure your infection is not getting worse. Contact a doctor if:  You have a fever.  Your symptoms do not get better after 1-2 days of treatment.  Your bone or joint under the infected area starts to hurt after the skin has healed.  Your infection comes back. This can happen in the same area or another area.  You have a swollen bump in the infected area.  You have new symptoms.  You feel ill and also have muscle aches and pains. Get help right away if:  Your symptoms get worse.  You feel very sleepy.  You throw up (vomit) or have watery poop (diarrhea) for a long time.  There are red streaks coming from the infected area.  Your red area gets larger.  Your red area turns darker. This information is not intended to replace advice given to you by your health care provider. Make sure you discuss any questions you have with your health care provider. Document Released: 03/02/2008 Document Revised: 02/20/2016 Document Reviewed: 07/24/2015 Elsevier Interactive Patient Education  2018 [employer].

## 2018-01-31 NOTE — Progress Notes (Signed)
Name: Sarah Horne   MRN: [id number]    DOB: March 10, 1956   Date:01/31/2018       Progress Note  Subjective  Chief Complaint  Chief Complaint  Patient presents with  . Wound Infection    on back, sore, bleeding  . Blister    on left leg, sore, swollen, red with pus    HPI  Has scabbed area on back that has been there for a couple of months. Patient states scratches as it occasionally states it was bleeding significantly a few days ago.   Patient noticed raised ares on left shin looked like a pimple, states mashed and yellow discharge was oozing out. States since Wednesday when she first noticed it- redness has expanded. No fevers or chills, no streaking, doesn't shave legs, no injury or trauma that she knows of. No know bites. Has never had this before.   Patient Active Problem List   Diagnosis Date Noted  . Brain metastasis ([place]) 05/19/2017  . Blood in stool   . Polyp of sigmoid colon   . Benign neoplasm of transverse colon   . Benign neoplasm of ascending colon   . Lower GI bleed   . Weakness of right lower extremity 02/11/2017  . Orthostatic lightheadedness 01/14/2017  . Atrial tachycardia ([place]) 01/14/2017  . Hypokalemia 01/14/2017  . Pulmonary emboli ([place]) 01/12/2017  . Dehydration 12/25/2016  . Blood loss anemia 12/15/2016  . Ulceration of colon   . Heartburn   . Duodenitis   . Gastritis without bleeding   . GI bleed   . Acute esophagitis   . Acute esophagogastric ulcer   . Rectal bleed   . Hypovolemic shock ([place])   . Ischemic leg 12/04/2016  . Chemotherapy induced neutropenia ([place]) 11/20/2016  . Palliative care by specialist   . Goals of care, counseling/discussion   . DNR (do not resuscitate) discussion   . Cerebral edema ([place]) 11/10/2016  . Encounter for monitoring cardiotoxic drug therapy 11/09/2016  . Elevated LFTs 10/20/2016  . Peripheral neuropathy due to chemotherapy ([place]) 03/13/2016  . Encounter for antineoplastic chemotherapy 03/13/2016  . HLD  (hyperlipidemia) 01/29/2016  . Hyperglycemia, unspecified 01/29/2016  . Cardiomyopathy ([place]) 01/13/2016  . Hypertension 07/18/2015  . Carcinoma of upper-outer quadrant of left breast in female, estrogen receptor positive ([place]) 12/27/2014    Past Medical History:  Diagnosis Date  . Chemotherapy-induced peripheral neuropathy ([place])   . Epistaxis    a. 11/2016 in setting of asa/plavix-->silver nitrate cauterization.  . GI bleed    a. 11/2016 Admission w/ GIB and hypovolemic shock req 3u PRBC's;  b. 11/2016 ECG: gastritis & nonbleeding peptic ulcer; c. 11/2016 Conlonoscopy: rectal and sigmoid colonic ulcers.  . Heart attack ([place])    a. 1998 Cath @ [facility]: reportedly no intervention required.  Marland Kitchen Herceptin-induced cardiomyopathy ([place])    a. In the setting of Herceptin Rx for breast cancer (initiated 12/2014); b. 03/2015 MUGA EF 64%; b. 08/2015 MUGA: EF 51%; c. 10/2015 MUGA: EF 44%; d. 11/2015 Echo: EF 45-50%; e. 01/2016 MUGA: EF 60%; f. 06/2016 MUGA EF 65%; g. 10/2016 MUGA: EF 61%;  h. 12/2016 Echo: EF 55-60%, gr1 DD.  Marland Kitchen Hyperlipidemia   . Hypertension   . Neuropathy   . Possible PAD (peripheral artery disease) ([place])    a. 11/2016 LE cyanosis and weak pulses-->CTA w/o significant Ao-BiFem dzs. ? distal dzs-->ASA/Plavix initiated by vascular surgery.  Marland Kitchen PSVT (paroxysmal supraventricular tachycardia) ([place])    a. Dx 11/2016.  . Pulmonary embolism ([place])  a. 12/2016 CTA Chest: small nonocclusive PE in inferior segment of the Left lingula, somewhat eccentric filling defect suggesting chronic rather than acute embolic event; b. 12/8268 LE U/S:  No DVT; c. 12/2016 Echo: Nl RV fxn, nl PASP.  Marland Kitchen Recurrent Metastatic breast cancer ([place])    a. Dx 2016: Stage II, ER positive, PR positive, HER-2/neu overexpressing of the left breast-->chemo/radiation; b. 10/2016 CT Abd/pelvis: diffuse liver mets, ill defined sclerotic bone lesions-T12;  c. 10/2016 MRI brain: metastatic lesion along L temporal lobe (19x14m) w/ extensive  surrounding edema & 515mmidline shift to right.  . Sinus tachycardia     Past Surgical History:  Procedure Laterality Date  . BREAST BIOPSY Left 2016   Positive  . BREAST LUMPECTOMY WITH SENTINEL LYMPH NODE BIOPSY Left 05/23/2015   Procedure: LEFT BREAST WIDE EXCISION WITH AXILLARY DISSECTION, MASTOPLASTY ;  Surgeon: JeRobert BellowMD;  Location: [facility] ORS;  Service: General;  Laterality: Left;  . BREAST SURGERY Left 12/18/14   breast biopsy/INVASIVE DUCTAL CARCINOMA OF BREAST, NOTTINGHAM GRADE 2.  . Marland KitchenREAST SURGERY  05/23/2015.   Wide excision/mastoplasty, axillary dissection. No residual invasive cancer, positive for residual DCIS. 0/2 nodes identified on axillary dissection. (no SLN by technetium or methylene blue)  . CARDIAC CATHETERIZATION    . COLONOSCOPY WITH PROPOFOL N/A 12/10/2016   Procedure: COLONOSCOPY WITH PROPOFOL;  Surgeon: DaLucilla LameMD;  Location: [facility] ENDOSCOPY;  Service: Endoscopy;  Laterality: N/A;  . COLONOSCOPY WITH PROPOFOL N/A 02/13/2017   Procedure: COLONOSCOPY WITH PROPOFOL;  Surgeon: WoLucilla LameMD;  [facility];  Service: Endoscopy;  Laterality: N/A;  . ESOPHAGOGASTRODUODENOSCOPY (EGD) WITH PROPOFOL N/A 12/08/2016   Procedure: ESOPHAGOGASTRODUODENOSCOPY (EGD) WITH PROPOFOL;  Surgeon: DaLucilla LameMD;  Location: [facility] ENDOSCOPY;  Service: Endoscopy;  Laterality: N/A;  . IVC FILTER INSERTION N/A 02/15/2017   Procedure: IVC Filter Insertion;  Surgeon: DeAlgernon HuxleyMD;  [facility];  Service: Cardiovascular;  Laterality: N/A;  . PORTACATH PLACEMENT Right 12-31-14   Dr ByBary Castilla  Social History   Tobacco Use  . Smoking status: Light Tobacco Smoker    Packs/day: 0.50    Years: 18.00    Pack years: 9.00    Types: Cigarettes  . Smokeless tobacco: Never Used  Substance Use Topics  . Alcohol use: No    Alcohol/week: 0.0 oz     Current Outpatient Medications:  .  acetic acid-hydrocortisone (VOSOL-HC) OTIC solution, Place 2  drops into both ears daily as needed., Disp: 10 mL, Rfl: 0 .  baclofen (LIORESAL) 10 MG tablet, Take 1 tablet (10 mg total) by mouth 3 (three) times daily., Disp: 30 tablet, Rfl: 0 .  capecitabine (XELODA) 500 MG tablet, Take 4 tablets (2,000 mg total) by mouth 2 (two) times daily after a meal. Take for 1 week-ON then 1 Week-OFF, Disp: 112 tablet, Rfl: 2 .  dexamethasone (DECADRON) 4 MG tablet, Take 1 tablet (4 mg total) by mouth daily., Disp: 20 tablet, Rfl: 0 .  feeding supplement, ENSURE ENLIVE, (ENSURE ENLIVE) LIQD, Take 237 mLs by mouth 2 (two) times daily between meals., Disp: 237 mL, Rfl: 12 .  flecainide (TAMBOCOR) 50 MG tablet, Take 1 tablet (50 mg total) by mouth 2 (two) times daily., Disp: 60 tablet, Rfl: 11 .  fluticasone (FLONASE) 50 MCG/ACT nasal spray, Place 2 sprays into both nostrils daily., Disp: 16 g, Rfl: 0 .  levocetirizine (XYZAL) 5 MG tablet, Take 1 tablet (5 mg total) by mouth every evening., Disp:  30 tablet, Rfl: 3 .  magnesium oxide (MAG-OX) 400 MG tablet, Take 1 tablet (400 mg total) by mouth 2 (two) times daily., Disp: 60 tablet, Rfl: 6 .  oxyCODONE (OXY IR/ROXICODONE) 5 MG immediate release tablet, Take 1 tablet (5 mg total) by mouth every 8 (eight) hours as needed for severe pain., Disp: 42 tablet, Rfl: 0 .  potassium chloride (KLOR-CON) 20 MEQ packet, Take by mouth 2 (two) times daily., Disp: , Rfl:  .  pregabalin (LYRICA) 75 MG capsule, Take 1 capsule (75 mg total) by mouth 2 (two) times daily., Disp: 60 capsule, Rfl: 3 .  traMADol (ULTRAM) 50 MG tablet, Take 1 tablet (50 mg total) by mouth every 6 (six) hours as needed., Disp: 12 tablet, Rfl: 0 .  VERZENIO 50 MG tablet, TAKE ONE TABLET (50 MG TOTAL) BY MOUTH ONCE DAILY FOR 21 DAYS THEN OFF FOR 7 DAYS, Disp: 28 tablet, Rfl: PRN .  Vitamin D, Ergocalciferol, (DRISDOL) 50000 units CAPS capsule, TAKE 1 CAPSULE (50,000 UNITS TOTAL) ONCE A WEEK BY MOUTH., Disp: 12 capsule, Rfl: 1 No current facility-administered medications  for this visit.   Facility-Administered Medications Ordered in Other Visits:  .  0.9 %  sodium chloride infusion, , Intravenous, Continuous, Pandit, Sandeep, MD .  0.9 %  sodium chloride infusion, , Intravenous, Continuous, Finnegan, Kathlene November, MD, Last Rate: 999 mL/hr at 04/24/15 1520 .  heparin lock flush 100 unit/mL, 500 Units, Intravenous, Once, Berenzon, Dmitriy, MD .  sodium chloride 0.9 % injection 10 mL, 10 mL, Intravenous, PRN, Ma Hillock, Sandeep, MD, 10 mL at 04/04/15 1440 .  sodium chloride flush (NS) 0.9 % injection 10 mL, 10 mL, Intravenous, PRN, Berenzon, Dmitriy, MD .  [facility]) injection 480 mcg, 480 mcg, Subcutaneous, Once, Cammie Sickle, MD  Allergies  Allergen Reactions  . No Known Allergies     ROS   Constitutional: Negative for fever or weight change.  Cardiovascular: Negative for chest pain or palpitations.  Gastrointestinal: Negative for abdominal pain, no bowel changes.  Skin: Positive for scab on back and raised area on left leg with yellow drainage and surrounding redness.  Neurological: Negative for dizziness or headache.  No other specific complaints in a complete review of systems (except as listed in HPI above).  Objective  Vitals:   01/31/18 1506  BP: 124/70  Pulse: (!) 101  Resp: 16  Temp: 98.6 F (37 C)  TempSrc: Oral  SpO2: 96%  Weight: 167 lb 3.2 oz (75.8 kg)  Height: 5' 6"  (1.676 m)    Body mass index is 26.99 kg/m.  Nursing Note and Vital Signs reviewed.  Physical Exam  Skin:        Constitutional: Patient appears well-developed and well-nourished. Obese  No distress.  Cardiovascular: Normal rate, regular rhythm, S1/S2 present.  No murmur or rub heard.  Pulmonary/Chest: Effort normal and breath sounds clear. No respiratory distress or retractions. Psychiatric: Patient has a normal mood and affect. behavior is normal. Judgment and thought content normal.  No results found for this or any previous visit  (from the past 72 hour(s)).  Assessment & Plan  1. Neoplasm of uncertain behavior  - Ambulatory referral to Dermatology  2. Cellulitis and abscess of leg - discussed starting abx now or after culture pt prefers after culture discussed importance of letting us know if worsening in between then to start PO abx earlier and emergent signs that may necessite IV abx. Use warm compresses and keep clean and dry.  -  Wound culture    -Red flags and when to present for emergency care or RTC including fever >101.69F, chest pain, shortness of breath, new/worsening/un-resolving symptoms, streaking up leg reviewed with patient at time of visit. Follow up and care instructions discussed and provided in AVS.

## 2018-01-31 NOTE — Progress Notes (Signed)
This patient presents to the office for evaluation of both feet.  Patient states that she is not diabetic, but has   neuropathy due to cancer treatment.  She says she is concerned about the thick nails of both big toenails both feet.  She says she also has painful calluses on the forefeet of both her feet.  She says the nails and the callus are painful walking and wearing her shoes.  She does state that she was treated to relieve the pain from the calluses but they have returned.  She presents the office today for an evaluation and treatment of her painful feet.   General Appearance  Alert, conversant and in no acute stress.  Vascular  Dorsalis pedis and posterior tibial  pulses are palpable  bilaterally.  Capillary return is within normal limits  bilaterally. Temperature is within normal limits  bilaterally.  Neurologic  Senn-Weinstein monofilament wire test diminished bilaterally. Muscle power within normal limits bilaterally.  Nails Thick disfigured discolored nails with subungual debris   hallux bilaterally. No evidence of bacterial infection or drainage bilaterally.  Orthopedic  No limitations of motion of motion feet .  No crepitus or effusions noted.  No bony pathology or digital deformities noted. Hallux limitus 1st MPJ  B/L.  Skin  normotropic skin  noted bilaterally.  No signs of infections or ulcers noted.  Porokeratosis sub 3 left and sub 4 right.  Onychomycosis  Porokeratosis  B/L  IE  Debride nails  X 2.  Debride porokeratosis  B/L  RTC 3 months.  To see Liliane Channel for orthoses.   Gardiner Barefoot DPM

## 2018-02-01 ENCOUNTER — Telehealth: Payer: Self-pay | Admitting: *Deleted

## 2018-02-01 ENCOUNTER — Other Ambulatory Visit: Payer: Self-pay | Admitting: Nurse Practitioner

## 2018-02-01 DIAGNOSIS — L02419 Cutaneous abscess of limb, unspecified: Secondary | ICD-10-CM

## 2018-02-01 DIAGNOSIS — L03119 Cellulitis of unspecified part of limb: Principal | ICD-10-CM

## 2018-02-01 MED ORDER — DOXYCYCLINE HYCLATE 100 MG PO TABS: 100.0000 mg | ORAL_TABLET | Freq: Two times a day (BID) | ORAL | 0 refills | Status: AC

## 2018-02-01 NOTE — Telephone Encounter (Signed)
Patient called to report that she has an infection on her left calf. She went to her PCP who has cultured it and told they will have to wait for the culture report to come back in 2 - 3 days before startng her on antibiotics. She has just started taking her oral chemotherapy Xeloda and has taken 1 dose. She was asking if it is safe for her to wait to be started on antibiotics. I explained to her that the culture will let us know exactly what medicine she needs and that if her PCP starts her on antibiotics, it may not be to correct one. Please advise if there is anything else she needs to do. Should she continue with oral chemotherapy?

## 2018-02-01 NOTE — Telephone Encounter (Signed)
Patient advised to stop the Capecitabine until she sees Dr B on 02/14/18, She repeated this verbatim back to me and said So I can take what my Phone call prescribes for me. I advised her yes and that if she has any questions or problems to call me back

## 2018-02-01 NOTE — Telephone Encounter (Signed)
Per Dr. Jacinto Reap, he advises that patient hold the Xeloda until her next appt with Dr. B which is on 02/14/18.

## 2018-02-03 LAB — WOUND CULTURE
MICRO NUMBER: 90554414
SPECIMEN QUALITY: ADEQUATE

## 2018-02-04 DIAGNOSIS — L02212 Cutaneous abscess of back [any part, except buttock]: Secondary | ICD-10-CM | POA: Diagnosis not present

## 2018-02-04 DIAGNOSIS — D239 Other benign neoplasm of skin, unspecified: Secondary | ICD-10-CM | POA: Diagnosis not present

## 2018-02-04 DIAGNOSIS — L603 Nail dystrophy: Secondary | ICD-10-CM | POA: Diagnosis not present

## 2018-02-11 ENCOUNTER — Encounter: Payer: Self-pay | Admitting: Radiation Oncology

## 2018-02-11 ENCOUNTER — Ambulatory Visit
Admission: RE | Admit: 2018-02-11 | Discharge: 2018-02-11 | Disposition: A | Discharge status: Home / Self Care | Payer: BLUE CROSS/BLUE SHIELD | Source: Ambulatory Visit | Attending: Radiation Oncology | Admitting: Radiation Oncology

## 2018-02-11 ENCOUNTER — Other Ambulatory Visit: Payer: Self-pay

## 2018-02-11 VITALS — BP 122/84 | HR 108 | Temp 96.6°F | Resp 18 | Wt 167.7 lb

## 2018-02-11 DIAGNOSIS — C50412 Malignant neoplasm of upper-outer quadrant of left female breast: Secondary | ICD-10-CM | POA: Diagnosis not present

## 2018-02-11 DIAGNOSIS — Z923 Personal history of irradiation: Secondary | ICD-10-CM | POA: Diagnosis not present

## 2018-02-11 DIAGNOSIS — Z170 Estrogen receptor positive status [ER+]: Secondary | ICD-10-CM | POA: Insufficient documentation

## 2018-02-11 DIAGNOSIS — Z79811 Long term (current) use of aromatase inhibitors: Secondary | ICD-10-CM | POA: Insufficient documentation

## 2018-02-11 DIAGNOSIS — C7931 Secondary malignant neoplasm of brain: Secondary | ICD-10-CM | POA: Insufficient documentation

## 2018-02-11 NOTE — Progress Notes (Signed)
Radiation Oncology Follow up Note  Name: Sarah Horne   Date:   02/11/2018 MRN:  [id number] DOB: 1956-09-23    This 62 y.o. female presents to the clinic today for one-month follow-up status post whole brain radiation therapy.  REFERRING PROVIDER: Steele Sizer, MD  HPI: patient is a 62 year old female with stage IV triple positive invasive mammary carcinoma initially treated with neoadjuvant chemotherapy plus Herceptin and received after lumpectomy postoperative whole breast and peripheral hepatic radiation. She now has stage IV disease withbrain liver metastasis. We initially treated partial brain with I MRT 1 year prior although developed multiple brain metastasis and we went on to treat with whole brain therapy..she is currently being treated with Faslodex+ abema . She is also being started on Xeloda orally.her liver lesions haven't observed to be stable and she has no new osseous metastatic lesions.    COMPLICATIONS OF TREATMENT: none  FOLLOW UP COMPLIANCE: keeps appointments   PHYSICAL EXAM:  BP 122/84   Pulse (!) 108   Temp (!) 96.6 F (35.9 C)   Resp 18   Wt 167 lb 10.6 oz (76.1 kg)   BMI 27.06 kg/m  Crude visual fields are normal range. Motor sensory and DTR levels are equal and symmetric upper lower extremities. No evidence of oral candidiasis. No focal neurologic deficits are noted. Well-developed well-nourished patient in NAD. HEENT reveals PERLA, EOMI, discs not visualized.  Oral cavity is clear. No oral mucosal lesions are identified. Neck is clear without evidence of cervical or supraclavicular adenopathy. Lungs are clear to A&P. Cardiac examination is essentially unremarkable with regular rate and rhythm without murmur rub or thrill. Abdomen is benign with no organomegaly or masses noted. Motor sensory and DTR levels are equal and symmetric in the upper and lower extremities. Cranial nerves II through XII are grossly intact. Proprioception is intact. No peripheral  adenopathy or edema is identified. No motor or sensory levels are noted. Crude visual fields are within normal range.  RADIOLOGY RESULTS: no current films for review  PLAN: present time patient is stable. I'll turn follow-up care and management over to medical oncology. I be happy to reevaluate the patient should any further palliative treatment be indicated.  I would like to take this opportunity to thank you for allowing me to participate in the care of your patient.Noreene Filbert, MD

## 2018-02-14 ENCOUNTER — Inpatient Hospital Stay (HOSPITAL_BASED_OUTPATIENT_CLINIC_OR_DEPARTMENT_OTHER): Payer: BLUE CROSS/BLUE SHIELD | Admitting: Internal Medicine

## 2018-02-14 ENCOUNTER — Inpatient Hospital Stay: Payer: BLUE CROSS/BLUE SHIELD

## 2018-02-14 VITALS — BP 110/76 | HR 99 | Temp 97.4°F | Resp 16 | Wt 167.8 lb

## 2018-02-14 DIAGNOSIS — I10 Essential (primary) hypertension: Secondary | ICD-10-CM

## 2018-02-14 DIAGNOSIS — M8580 Other specified disorders of bone density and structure, unspecified site: Secondary | ICD-10-CM

## 2018-02-14 DIAGNOSIS — Z170 Estrogen receptor positive status [ER+]: Secondary | ICD-10-CM

## 2018-02-14 DIAGNOSIS — F17210 Nicotine dependence, cigarettes, uncomplicated: Secondary | ICD-10-CM | POA: Diagnosis not present

## 2018-02-14 DIAGNOSIS — Z8719 Personal history of other diseases of the digestive system: Secondary | ICD-10-CM

## 2018-02-14 DIAGNOSIS — Z86711 Personal history of pulmonary embolism: Secondary | ICD-10-CM

## 2018-02-14 DIAGNOSIS — C50412 Malignant neoplasm of upper-outer quadrant of left female breast: Secondary | ICD-10-CM

## 2018-02-14 DIAGNOSIS — C7931 Secondary malignant neoplasm of brain: Secondary | ICD-10-CM

## 2018-02-14 DIAGNOSIS — I252 Old myocardial infarction: Secondary | ICD-10-CM | POA: Diagnosis not present

## 2018-02-14 DIAGNOSIS — C787 Secondary malignant neoplasm of liver and intrahepatic bile duct: Secondary | ICD-10-CM | POA: Diagnosis not present

## 2018-02-14 DIAGNOSIS — B958 Unspecified staphylococcus as the cause of diseases classified elsewhere: Secondary | ICD-10-CM

## 2018-02-14 DIAGNOSIS — E785 Hyperlipidemia, unspecified: Secondary | ICD-10-CM

## 2018-02-14 DIAGNOSIS — Z803 Family history of malignant neoplasm of breast: Secondary | ICD-10-CM

## 2018-02-14 DIAGNOSIS — Z79818 Long term (current) use of other agents affecting estrogen receptors and estrogen levels: Secondary | ICD-10-CM | POA: Diagnosis not present

## 2018-02-14 DIAGNOSIS — Z9221 Personal history of antineoplastic chemotherapy: Secondary | ICD-10-CM | POA: Diagnosis not present

## 2018-02-14 DIAGNOSIS — L03116 Cellulitis of left lower limb: Secondary | ICD-10-CM

## 2018-02-14 DIAGNOSIS — Z79899 Other long term (current) drug therapy: Secondary | ICD-10-CM

## 2018-02-14 LAB — CBC WITH DIFFERENTIAL/PLATELET
Basophils Absolute: 0 10*3/uL (ref 0–0.1)
Basophils Relative: 1 %
EOS PCT: 2 %
Eosinophils Absolute: 0.1 10*3/uL (ref 0–0.7)
HCT: 40.5 % (ref 35.0–47.0)
Hemoglobin: 13.8 g/dL (ref 12.0–16.0)
LYMPHS ABS: 0.7 10*3/uL — AB (ref 1.0–3.6)
LYMPHS PCT: 19 %
MCH: 33.5 pg (ref 26.0–34.0)
MCHC: 34.1 g/dL (ref 32.0–36.0)
MCV: 98.2 fL (ref 80.0–100.0)
MONO ABS: 0.3 10*3/uL (ref 0.2–0.9)
Monocytes Relative: 8 %
Neutro Abs: 2.6 10*3/uL (ref 1.4–6.5)
Neutrophils Relative %: 70 %
Platelets: 169 10*3/uL (ref 150–440)
RBC: 4.13 MIL/uL (ref 3.80–5.20)
RDW: 15.5 % — ABNORMAL HIGH (ref 11.5–14.5)
WBC: 3.7 10*3/uL (ref 3.6–11.0)

## 2018-02-14 LAB — COMPREHENSIVE METABOLIC PANEL
ALK PHOS: 101 U/L (ref 38–126)
ALT: 24 U/L (ref 14–54)
AST: 43 U/L — ABNORMAL HIGH (ref 15–41)
Albumin: 3.6 g/dL (ref 3.5–5.0)
Anion gap: 11 (ref 5–15)
BUN: 11 mg/dL (ref 6–20)
CALCIUM: 9.2 mg/dL (ref 8.9–10.3)
CO2: 24 mmol/L (ref 22–32)
CREATININE: 0.86 mg/dL (ref 0.44–1.00)
Chloride: 101 mmol/L (ref 101–111)
Glucose, Bld: 116 mg/dL — ABNORMAL HIGH (ref 65–99)
Potassium: 4.2 mmol/L (ref 3.5–5.1)
Sodium: 136 mmol/L (ref 135–145)
TOTAL PROTEIN: 7.6 g/dL (ref 6.5–8.1)
Total Bilirubin: 1.1 mg/dL (ref 0.3–1.2)

## 2018-02-14 NOTE — Progress Notes (Signed)
[facility] PROGRESS NOTE  Patient Care Team: Sarah Sizer, [facility] as PCP - General (Family Medicine) [facility], Sarah Gleason, [facility] (General Surgery) Sarah Alf, [facility] (Inactive) as Attending Physician (Internal Medicine)  Cancer Staging No matching staging information was found for the patient.   Oncology History   # LEFT BREAST IDC; STAGE II [cT2N1] ER >90%; PR- 50-90%; her 2 NEU- POS; s/p Neoadj chemo; AUG 2016- TCH+P s/p Lumpec & partial ALND- path CR; s/p RT [finished Nov 2016];  adj Herceptin; HELD for Jan 19th 2017 [in DEC 2016-EF dropped from 63 to 51%; FEB 27th EF-42.9%]; JAN 2016 START Arimidex; May 2017- EF- 60%; May 24th 2017-Re-start Herceptin q 3W; July 28th STOP herceptin [finished 76m/m2; sec to Low EF; however 2017 Oct EF= 67%; improved]  # DEC 4th 2017- Start Neratinib 4 pills; DEC 11th 3 pills; STOPPED.  # FEB 2018- RECURRENCE ER/PR positive; Her 2 NEGATIVE [liver Bx]  # MARCH 1st 2018- Tax-Cytoxan [poor tol]  # FEB 2018- Brain mets [SBRT; Dr.Crystal; finished Feb 28th 2018]  # March 2018- Eribulin- poor tolerance  # June 8th 2018- Started Faslodex + Abema [abema-multiple interruptions sec to neutropenia]  # MRI Brain- March 2019- Leptomeningeal mets [WBRT; No LP s/p WBRT- 01/09/2018]; STOP Abema [546mday-sec to severe cytopenia]+faslodex  # MAY 20tth 019- Start Xeloda    # PN- G-2; may 2017- Cymbalta 603m;MARCH 2018-  acute vascular insuff of BIL LE [? Taxotere vasoconstriction on asprin]; # hemorragic shock LGIB [s/p colo; Dr.Wohl]  #  SVT- on flecainide.   # April 2016- Liver Bx- NEG;   # Drop in EF from Herceptin [recovered OCt 27th- EF 65%]  # BMD- jan 2017- osteopenia  -----------------------------------------------------------     [place] [facility]- PDL-1 0%/NEG for BRCA; MSI-Stable; Positive for PI3K; ccnd-1; FGF**  -----------------------------------------------------------  Dx: Metastatic  Breast cancer [ER/PR-Pos; her -2 NEG] Stage IV; goals: palliative Current treatment- Xeloda [may 20th 2019]     Carcinoma of upper-outer quadrant of left breast in female, estrogen receptor positive ([facility]    INTERVAL HISTORY:  [name] 66o.  female pleasant patient above history of metastatic breast cancer is here for follow-up  Patient in the interim was diagnosed with " staph infection" of her legs.  She had painful nodules on the left leg-this has been treated with antibiotics.  Denies any new rash.  She did not start her Xeloda given the infection.  Review of Systems  Constitutional: Negative for chills, diaphoresis, fever, malaise/fatigue and weight loss.  HENT: Negative for nosebleeds and sore throat.   Eyes: Negative for double vision.  Respiratory: Negative for cough, hemoptysis, sputum production, shortness of breath and wheezing.   Cardiovascular: Negative for chest pain, palpitations, orthopnea and leg swelling.  Gastrointestinal: Negative for abdominal pain, blood in stool, constipation, diarrhea, heartburn, melena, nausea and vomiting.  Genitourinary: Negative for dysuria, frequency and urgency.  Musculoskeletal: Negative for back pain and joint pain.  Skin: Positive for rash. Negative for itching.  Neurological: Negative for dizziness, tingling, focal weakness, weakness and headaches.  Endo/Heme/Allergies: Does not bruise/bleed easily.  Psychiatric/Behavioral: Negative for depression. The patient is not nervous/anxious and does not have insomnia.       PAST MEDICAL HISTORY :  Past Medical History:  Diagnosis Date  . Chemotherapy-induced peripheral neuropathy ([facility] . Epistaxis    a. 11/2016 in setting of asa/plavix-->silver nitrate cauterization.  . GI bleed    a. 11/2016 Admission  w/ GIB and hypovolemic shock req 3u PRBC's;  b. 11/2016 ECG: gastritis & nonbleeding peptic ulcer; c. 11/2016 Conlonoscopy: rectal and sigmoid colonic ulcers.  . Heart attack  ([facility])    a. 1998 Cath @ [facility]: reportedly no intervention required.  Marland Kitchen Herceptin-induced cardiomyopathy ([facility])    a. In the setting of Herceptin Rx for breast cancer (initiated 12/2014); b. 03/2015 MUGA EF 64%; b. 08/2015 MUGA: EF 51%; c. 10/2015 MUGA: EF 44%; d. 11/2015 Echo: EF 45-50%; e. 01/2016 MUGA: EF 60%; f. 06/2016 MUGA EF 65%; g. 10/2016 MUGA: EF 61%;  h. 12/2016 Echo: EF 55-60%, gr1 DD.  Marland Kitchen Hyperlipidemia   . Hypertension   . Neuropathy   . Possible PAD (peripheral artery disease) ([facility])    a. 11/2016 LE cyanosis and weak pulses-->CTA w/o significant Ao-BiFem dzs. ? distal dzs-->ASA/Plavix initiated by vascular surgery.  Marland Kitchen PSVT (paroxysmal supraventricular tachycardia) ([facility])    a. Dx 11/2016.  . Pulmonary embolism ([facility])    a. 12/2016 CTA Chest: small nonocclusive PE in inferior segment of the Left lingula, somewhat eccentric filling defect suggesting chronic rather than acute embolic event; b. 05/8337 LE U/S:  No DVT; c. 12/2016 Echo: Nl RV fxn, nl PASP.  Marland Kitchen Recurrent Metastatic breast cancer ([facility])    a. Dx 2016: Stage II, ER positive, PR positive, HER-2/neu overexpressing of the left breast-->chemo/radiation; b. 10/2016 CT Abd/pelvis: diffuse liver mets, ill defined sclerotic bone lesions-T12;  c. 10/2016 MRI brain: metastatic lesion along L temporal lobe (19x61m) w/ extensive surrounding edema & 598mmidline shift to right.  . Sinus tachycardia     PAST SURGICAL HISTORY :   Past Surgical History:  Procedure Laterality Date  . BREAST BIOPSY Left 2016   Positive  . BREAST LUMPECTOMY WITH SENTINEL LYMPH NODE BIOPSY Left 05/23/2015   Procedure: LEFT BREAST WIDE EXCISION WITH AXILLARY DISSECTION, MASTOPLASTY ;  Surgeon: Sarah BellowMD;  Location: [facility];  Service: General;  Laterality: Left;  . BREAST SURGERY Left 12/18/14   breast biopsy/INVASIVE DUCTAL CARCINOMA OF BREAST, NOTTINGHAM GRADE 2.  . Marland KitchenREAST SURGERY  05/23/2015.   Wide excision/mastoplasty, axillary dissection. No residual invasive  cancer, positive for residual DCIS. 0/2 nodes identified on axillary dissection. (no SLN by technetium or methylene blue)  . CARDIAC CATHETERIZATION    . COLONOSCOPY WITH PROPOFOL N/A 12/10/2016   Procedure: COLONOSCOPY WITH PROPOFOL;  Surgeon: DaLucilla LameMD;  Location: [facility];  Service: Endoscopy;  Laterality: N/A;  . COLONOSCOPY WITH PROPOFOL N/A 02/13/2017   Procedure: COLONOSCOPY WITH PROPOFOL;  Surgeon: WoLucilla LameMD;  [facility];  Service: Endoscopy;  Laterality: N/A;  . ESOPHAGOGASTRODUODENOSCOPY (EGD) WITH PROPOFOL N/A 12/08/2016   Procedure: ESOPHAGOGASTRODUODENOSCOPY (EGD) WITH PROPOFOL;  Surgeon: DaLucilla LameMD;  [facility];  Service: Endoscopy;  Laterality: N/A;  . IVC FILTER INSERTION N/A 02/15/2017   Procedure: IVC Filter Insertion;  Surgeon: DeAlgernon HuxleyMD;  [facility];  Service: Cardiovascular;  Laterality: N/A;  . PORTACATH PLACEMENT Right 12-31-14   Dr ByBary [facility]  FAMILY HISTORY :   Family History  Problem Relation Age of Onset  . Breast cancer Maternal Aunt   . Breast cancer Cousin   . Brain cancer Maternal Uncle   . Diabetes Mother   . Hypertension Mother   . Stroke Mother     SOCIAL HISTORY:   Social History   Tobacco Use  . Smoking status: Light Tobacco Smoker    Packs/day: 0.50    Years:  18.00    Pack years: 9.00    Types: Cigarettes  . Smokeless tobacco: Never Used  Substance Use Topics  . Alcohol use: No    Alcohol/week: 0.0 oz  . Drug use: No    ALLERGIES:  is allergic to no known allergies.  MEDICATIONS:  Current Outpatient Medications  Medication Sig Dispense Refill  . acetic acid-hydrocortisone (VOSOL-HC) OTIC solution Place 2 drops into both ears daily as needed. 10 mL 0  . baclofen (LIORESAL) 10 MG tablet Take 1 tablet (10 mg total) by mouth 3 (three) times daily. 30 tablet 0  . capecitabine (XELODA) 500 MG tablet Take 4 tablets (2,000 mg total) by mouth 2 (two) times daily after a  meal. Take for 1 week-ON then 1 Week-OFF 112 tablet 2  . dexamethasone (DECADRON) 4 MG tablet Take 1 tablet (4 mg total) by mouth daily. 20 tablet 0  . feeding supplement, ENSURE ENLIVE, (ENSURE ENLIVE) LIQD Take 237 mLs by mouth 2 (two) times daily between meals. 237 mL 12  . flecainide (TAMBOCOR) 50 MG tablet Take 1 tablet (50 mg total) by mouth 2 (two) times daily. 60 tablet 11  . fluticasone (FLONASE) 50 MCG/ACT nasal spray Place 2 sprays into both nostrils daily. 16 g 0  . levocetirizine (XYZAL) 5 MG tablet Take 1 tablet (5 mg total) by mouth every evening. 30 tablet 3  . magnesium oxide (MAG-OX) 400 MG tablet Take 1 tablet (400 mg total) by mouth 2 (two) times daily. 60 tablet 6  . oxyCODONE (OXY IR/ROXICODONE) 5 MG immediate release tablet Take 1 tablet (5 mg total) by mouth every 8 (eight) hours as needed for severe pain. 42 tablet 0  . potassium chloride (KLOR-CON) 20 MEQ packet Take by mouth 2 (two) times daily.    . pregabalin (LYRICA) 75 MG capsule Take 1 capsule (75 mg total) by mouth 2 (two) times daily. 60 capsule 3  . traMADol (ULTRAM) 50 MG tablet Take 1 tablet (50 mg total) by mouth every 6 (six) hours as needed. 12 tablet 0  . VERZENIO 50 MG tablet TAKE ONE TABLET (50 MG TOTAL) BY MOUTH ONCE DAILY FOR 21 DAYS THEN OFF FOR 7 DAYS 28 tablet PRN  . Vitamin D, Ergocalciferol, (DRISDOL) 50000 units CAPS capsule TAKE 1 CAPSULE (50,000 UNITS TOTAL) ONCE A WEEK BY MOUTH. 12 capsule 1   No current facility-administered medications for this visit.    Facility-Administered Medications Ordered in Other Visits  Medication Dose Route Frequency Provider Last Rate Last Dose  . 0.9 %  sodium chloride infusion   Intravenous Continuous Pandit, Sandeep, [facility]      . 0.9 %  sodium chloride infusion   Intravenous Continuous Lloyd Huger, [facility] 999 mL/hr at 04/24/15 1520    . heparin lock flush 100 unit/mL  500 Units Intravenous Once Berenzon, Dmitriy, [facility]      . sodium chloride 0.9 % injection 10  mL  10 mL Intravenous PRN Sarah Alf, [facility]   10 mL at 04/04/15 1440  . sodium chloride flush (NS) 0.9 % injection 10 mL  10 mL Intravenous PRN Berenzon, Dmitriy, [facility]      . Tbo-Filgrastim (GRANIX) injection 480 mcg  480 mcg Subcutaneous Once Cammie Sickle, [facility]        PHYSICAL EXAMINATION: ECOG PERFORMANCE STATUS: 0 - Asymptomatic  BP 110/76 (BP Location: Left Arm, Patient Position: Sitting)   Pulse 99   Temp (!) 97.4 F (36.3 C) (Tympanic)   Resp 16  Wt 167 lb 12.8 oz (76.1 kg)   BMI 27.08 kg/m   Filed Weights   02/14/18 1059  Weight: 167 lb 12.8 oz (76.1 kg)    GENERAL: Well-nourished well-developed; Alert, no distress and comfortable.  Alone EYES: no pallor or icterus OROPHARYNX: no thrush or ulceration; NECK: supple; no lymph nodes felt. LYMPH:  no palpable lymphadenopathy in the axillary or inguinal regions LUNGS: Decreased breath sounds auscultation bilaterally. No wheeze or crackles HEART/CVS: regular rate & rhythm and no murmurs; No lower extremity edema ABDOMEN:abdomen soft, non-tender and normal bowel sounds. No hepatomegaly or splenomegaly.  Musculoskeletal:no cyanosis of digits and no clubbing  PSYCH: alert & oriented x 3 with fluent speech NEURO: no focal motor/sensory deficits SKIN:  no rashes or significant lesions; scar from recent folliculitis noted on the left leg.    LABORATORY DATA:  I have reviewed the data as listed    Component Value Date/Time   NA 136 02/14/2018 1021   NA 135 03/04/2017 0918   NA 135 01/08/2015 0850   K 4.2 02/14/2018 1021   K 3.7 01/08/2015 0850   CL 101 02/14/2018 1021   CL 101 01/08/2015 0850   CO2 24 02/14/2018 1021   CO2 26 01/08/2015 0850   GLUCOSE 116 (H) 02/14/2018 1021   GLUCOSE 161 (H) 01/08/2015 0850   BUN 11 02/14/2018 1021   BUN 8 03/04/2017 0918   BUN 17 01/08/2015 0850   CREATININE 0.86 02/14/2018 1021   CREATININE 0.82 01/22/2015 1559   CALCIUM 9.2 02/14/2018 1021   CALCIUM 9.3 01/08/2015 0850    PROT 7.6 02/14/2018 1021   PROT 6.4 03/04/2017 0918   PROT 6.8 01/22/2015 1559   ALBUMIN 3.6 02/14/2018 1021   ALBUMIN 2.5 (L) 03/04/2017 0918   ALBUMIN 3.9 01/22/2015 1559   AST 43 (H) 02/14/2018 1021   AST 34 01/22/2015 1559   ALT 24 02/14/2018 1021   ALT 42 01/22/2015 1559   ALKPHOS 101 02/14/2018 1021   ALKPHOS 157 (H) 01/22/2015 1559   BILITOT 1.1 02/14/2018 1021   BILITOT 6.2 (H) 03/04/2017 0918   BILITOT 0.2 (L) 01/22/2015 1559   GFRNONAA >60 02/14/2018 1021   GFRNONAA >60 01/22/2015 1559   GFRAA >60 02/14/2018 1021   GFRAA >60 01/22/2015 1559    No results found for: SPEP, UPEP  Lab Results  Component Value Date   WBC 3.7 02/14/2018   NEUTROABS 2.6 02/14/2018   HGB 13.8 02/14/2018   HCT 40.5 02/14/2018   MCV 98.2 02/14/2018   PLT 169 02/14/2018      Chemistry      Component Value Date/Time   NA 136 02/14/2018 1021   NA 135 03/04/2017 0918   NA 135 01/08/2015 0850   K 4.2 02/14/2018 1021   K 3.7 01/08/2015 0850   CL 101 02/14/2018 1021   CL 101 01/08/2015 0850   CO2 24 02/14/2018 1021   CO2 26 01/08/2015 0850   BUN 11 02/14/2018 1021   BUN 8 03/04/2017 0918   BUN 17 01/08/2015 0850   CREATININE 0.86 02/14/2018 1021   CREATININE 0.82 01/22/2015 1559      Component Value Date/Time   CALCIUM 9.2 02/14/2018 1021   CALCIUM 9.3 01/08/2015 0850   ALKPHOS 101 02/14/2018 1021   ALKPHOS 157 (H) 01/22/2015 1559   AST 43 (H) 02/14/2018 1021   AST 34 01/22/2015 1559   ALT 24 02/14/2018 1021   ALT 42 01/22/2015 1559   BILITOT 1.1 02/14/2018 1021   BILITOT  6.2 (H) 03/04/2017 0918   BILITOT 0.2 (L) 01/22/2015 1559       RADIOGRAPHIC STUDIES: I have personally reviewed the radiological images as listed and agreed with the findings in the report. No results found.   ASSESSMENT & PLAN:  Carcinoma of upper-outer quadrant of left breast in female, estrogen receptor positive ([name]) #Recurrent metastatic breast cancer-ER PR positive HER-2 negative; CT scan  April 2019-increasing 10 mm liver nodule; leptomeningeal disease on MRI.   Worse.   #Given the progressive disease in the liver/brain-recommend start xeloda 4 pill BID; 1 week-On and 1 week OFF.  Again reviewed the potential side effects of diarrhea hand-foot syndrome etc.  Patient understands treatments are palliative not curative.  # Brain metastases-recurrent/likely leptomeningeal disease;  S/p WBRT [April 2019].  Stable  #Staph infection/cellulitis-left lower leg improved.   # PN G-1-second to chemotherapy; Lyrica/oxycodone as needed; stable.  Continue current therapy  # follow up in 2 weeks/labs/; start xeloda today/[facility].    Orders Placed This Encounter  Procedures  . CBC with Differential    Standing Status:   Future    Standing Expiration Date:   02/15/2019  . Comprehensive metabolic panel    Standing Status:   Future    Standing Expiration Date:   02/15/2019   All questions were answered. The patient knows to call the clinic with any problems, questions or concerns.      Cammie Sickle, [facility] 02/14/2018 11:50 AM

## 2018-02-14 NOTE — Assessment & Plan Note (Addendum)
#  Recurrent metastatic breast cancer-ER PR positive HER-2 negative; CT scan April 2019-increasing 10 mm liver nodule; leptomeningeal disease on MRI.   Worse.   #Given the progressive disease in the liver/brain-recommend start xeloda 4 pill BID; 1 week-On and 1 week OFF.  Again reviewed the potential side effects of diarrhea hand-foot syndrome etc.  Patient understands treatments are palliative not curative.  # Brain metastases-recurrent/likely leptomeningeal disease;  S/p WBRT [April 2019].  Stable  #Staph infection/cellulitis-left lower leg improved.   # PN G-1-second to chemotherapy; Lyrica/oxycodone as needed; stable.  Continue current therapy  # follow up in 2 weeks/labs/; start xeloda today/MD.

## 2018-02-20 ENCOUNTER — Emergency Department: Payer: BLUE CROSS/BLUE SHIELD

## 2018-02-20 ENCOUNTER — Encounter: Payer: Self-pay | Admitting: Emergency Medicine

## 2018-02-20 ENCOUNTER — Emergency Department
Admission: EM | Admit: 2018-02-20 | Discharge: 2018-02-20 | Disposition: A | Discharge status: Home / Self Care | Payer: BLUE CROSS/BLUE SHIELD | Attending: Emergency Medicine | Admitting: Emergency Medicine

## 2018-02-20 ENCOUNTER — Other Ambulatory Visit: Payer: Self-pay

## 2018-02-20 DIAGNOSIS — R112 Nausea with vomiting, unspecified: Secondary | ICD-10-CM

## 2018-02-20 DIAGNOSIS — T887XXA Unspecified adverse effect of drug or medicament, initial encounter: Secondary | ICD-10-CM | POA: Insufficient documentation

## 2018-02-20 DIAGNOSIS — Y658 Other specified misadventures during surgical and medical care: Secondary | ICD-10-CM | POA: Insufficient documentation

## 2018-02-20 DIAGNOSIS — Z5111 Encounter for antineoplastic chemotherapy: Secondary | ICD-10-CM | POA: Diagnosis not present

## 2018-02-20 DIAGNOSIS — R000 Tachycardia, unspecified: Secondary | ICD-10-CM | POA: Diagnosis not present

## 2018-02-20 DIAGNOSIS — T50905A Adverse effect of unspecified drugs, medicaments and biological substances, initial encounter: Secondary | ICD-10-CM

## 2018-02-20 DIAGNOSIS — C801 Malignant (primary) neoplasm, unspecified: Secondary | ICD-10-CM | POA: Diagnosis not present

## 2018-02-20 DIAGNOSIS — C7931 Secondary malignant neoplasm of brain: Secondary | ICD-10-CM | POA: Diagnosis not present

## 2018-02-20 DIAGNOSIS — I10 Essential (primary) hypertension: Secondary | ICD-10-CM | POA: Diagnosis not present

## 2018-02-20 DIAGNOSIS — F17210 Nicotine dependence, cigarettes, uncomplicated: Secondary | ICD-10-CM | POA: Insufficient documentation

## 2018-02-20 DIAGNOSIS — T451X5A Adverse effect of antineoplastic and immunosuppressive drugs, initial encounter: Secondary | ICD-10-CM | POA: Diagnosis not present

## 2018-02-20 DIAGNOSIS — Z79899 Other long term (current) drug therapy: Secondary | ICD-10-CM | POA: Diagnosis not present

## 2018-02-20 DIAGNOSIS — C50919 Malignant neoplasm of unspecified site of unspecified female breast: Secondary | ICD-10-CM | POA: Insufficient documentation

## 2018-02-20 LAB — COMPREHENSIVE METABOLIC PANEL
ALBUMIN: 3.8 g/dL (ref 3.5–5.0)
ALT: 20 U/L (ref 14–54)
AST: 36 U/L (ref 15–41)
Alkaline Phosphatase: 79 U/L (ref 38–126)
Anion gap: 8 (ref 5–15)
BILIRUBIN TOTAL: 1.3 mg/dL — AB (ref 0.3–1.2)
BUN: 14 mg/dL (ref 6–20)
CALCIUM: 8.8 mg/dL — AB (ref 8.9–10.3)
CO2: 25 mmol/L (ref 22–32)
Chloride: 101 mmol/L (ref 101–111)
Creatinine, Ser: 0.76 mg/dL (ref 0.44–1.00)
GFR calc Af Amer: >60 mL/min (ref >60)
GFR calc non Af Amer: >60 mL/min (ref >60)
GLUCOSE: 85 mg/dL (ref 65–99)
Potassium: 3.8 mmol/L (ref 3.5–5.1)
Sodium: 134 mmol/L — ABNORMAL LOW (ref 135–145)
TOTAL PROTEIN: 7.7 g/dL (ref 6.5–8.1)

## 2018-02-20 LAB — CBC WITH DIFFERENTIAL/PLATELET
BASOS ABS: 0 10*3/uL (ref 0–0.1)
BASOS PCT: 0 %
Eosinophils Absolute: 0 10*3/uL (ref 0–0.7)
Eosinophils Relative: 1 %
HEMATOCRIT: 41.4 % (ref 35.0–47.0)
Hemoglobin: 14.3 g/dL (ref 12.0–16.0)
Lymphocytes Relative: 17 %
Lymphs Abs: 0.7 10*3/uL — ABNORMAL LOW (ref 1.0–3.6)
MCH: 33.6 pg (ref 26.0–34.0)
MCHC: 34.5 g/dL (ref 32.0–36.0)
MCV: 97.3 fL (ref 80.0–100.0)
MONOS PCT: 10 %
Monocytes Absolute: 0.4 10*3/uL (ref 0.2–0.9)
NEUTROS ABS: 3 10*3/uL (ref 1.4–6.5)
NEUTROS PCT: 72 %
Platelets: 192 10*3/uL (ref 150–440)
RBC: 4.26 MIL/uL (ref 3.80–5.20)
RDW: 15.5 % — ABNORMAL HIGH (ref 11.5–14.5)
WBC: 4.2 10*3/uL (ref 3.6–11.0)

## 2018-02-20 LAB — URINALYSIS, COMPLETE (UACMP) WITH MICROSCOPIC
BACTERIA UA: NONE SEEN
Bilirubin Urine: NEGATIVE
Glucose, UA: NEGATIVE mg/dL
Hgb urine dipstick: NEGATIVE
Ketones, ur: NEGATIVE mg/dL
Leukocytes, UA: NEGATIVE
Nitrite: NEGATIVE
PH: 6 (ref 5.0–8.0)
Protein, ur: NEGATIVE mg/dL
SPECIFIC GRAVITY, URINE: 1.023 (ref 1.005–1.030)

## 2018-02-20 LAB — TROPONIN I: Troponin I: <0.03 ng/mL (ref <0.03)

## 2018-02-20 MED ORDER — ONDANSETRON HCL 4 MG/2ML IJ SOLN
4.0000 mg | Freq: Once | INTRAMUSCULAR | Status: AC
  Administered 2018-02-20: 4 mg via INTRAVENOUS
  Filled 2018-02-20: qty 2

## 2018-02-20 MED ORDER — IOPAMIDOL (ISOVUE-300) INJECTION 61%
75.0000 mL | Freq: Once | INTRAVENOUS | Status: AC | PRN
  Administered 2018-02-20: 75 mL via INTRAVENOUS

## 2018-02-20 MED ORDER — SODIUM CHLORIDE 0.9 % IV SOLN
Freq: Once | INTRAVENOUS | Status: AC
Start: 2018-02-20 — End: 2018-02-20
  Administered 2018-02-20: 10:00:00 via INTRAVENOUS

## 2018-02-20 MED ORDER — HEPARIN SOD (PORK) LOCK FLUSH 100 UNIT/ML IV SOLN
INTRAVENOUS | Status: AC
  Administered 2018-02-20: 500 [IU]
  Filled 2018-02-20: qty 5

## 2018-02-20 NOTE — ED Notes (Signed)
Pt taken to CT.

## 2018-02-20 NOTE — ED Notes (Signed)
Pt in scans. °

## 2018-02-20 NOTE — ED Notes (Signed)
Pt state she is feeling better, asking about DC paperwork. Informed Dr. Jacqualine Code.

## 2018-02-20 NOTE — Discharge Instructions (Addendum)
Please STOP Xeloda for now, this may be causing your nausea and vomiting.  Please set up a close follow-up visit with Dr. Rogue Bussing to discuss changes in your medications, and possibly restarting her medicine at lower dose.  Please return to the emergency room right away if you are to develop a fever, severe nausea, your pain becomes severe or worsens, you are unable to keep food down, begin vomiting any dark or bloody fluid, you develop any dark or bloody stools, feel dehydrated, or other new concerns or symptoms arise.

## 2018-02-20 NOTE — ED Notes (Signed)
Pt states she hasn't taken any of her meds this AM, states she takes flecainide to control her HR. Dr. Jacqualine Code informed.

## 2018-02-20 NOTE — ED Provider Notes (Signed)
[facility] Emergency Department Provider Note   ____________________________________________   First MD Initiated Contact with Patient 02/20/18 1015     (approximate)  I have reviewed the triage vital signs and the nursing notes.   HISTORY  Chief Complaint Nausea and Fatigue    HPI Sarah Horne is a 62 y.o. female currently under active treatment for breast cancer  Patient reports that about Wednesday she started a new higher dose of her breast cancer medication and after taking it she is been developing nausea.  Over the last 2 days she is taken her medicine 30 minutes after eating, but continues to develop nausea with occasional vomiting.  She is vomited about 2-3 times which she describes as acid and has nasty taste in her mouth.  She believes her medication is causing this.  Denies headache.  No fevers or chills.  Denies that her stomach actually hurts, but reports that rather just nauseated and she throws up.  She stopped taking her oral chemotherapy medication this morning because she believes is causing her to throw up.  No headache.  No fevers or chills.  No chest pain or trouble breathing.  No black or bloody stools.  Continues to move her bowels normally about once a day.  Past Medical History:  Diagnosis Date  . Chemotherapy-induced peripheral neuropathy ([place])   . Epistaxis    a. 11/2016 in setting of asa/plavix-->silver nitrate cauterization.  . GI bleed    a. 11/2016 Admission w/ GIB and hypovolemic shock req 3u PRBC's;  b. 11/2016 ECG: gastritis & nonbleeding peptic ulcer; c. 11/2016 Conlonoscopy: rectal and sigmoid colonic ulcers.  . Heart attack ([place])    a. 1998 Cath @ [facility]: reportedly no intervention required.  Marland Kitchen Herceptin-induced cardiomyopathy ([place])    a. In the setting of Herceptin Rx for breast cancer (initiated 12/2014); b. 03/2015 MUGA EF 64%; b. 08/2015 MUGA: EF 51%; c. 10/2015 MUGA: EF 44%; d. 11/2015 Echo: EF 45-50%; e. 01/2016 MUGA: EF  60%; f. 06/2016 MUGA EF 65%; g. 10/2016 MUGA: EF 61%;  h. 12/2016 Echo: EF 55-60%, gr1 DD.  Marland Kitchen Hyperlipidemia   . Hypertension   . Neuropathy   . Possible PAD (peripheral artery disease) ([place])    a. 11/2016 LE cyanosis and weak pulses-->CTA w/o significant Ao-BiFem dzs. ? distal dzs-->ASA/Plavix initiated by vascular surgery.  Marland Kitchen PSVT (paroxysmal supraventricular tachycardia) ([place])    a. Dx 11/2016.  . Pulmonary embolism ([place])    a. 12/2016 CTA Chest: small nonocclusive PE in inferior segment of the Left lingula, somewhat eccentric filling defect suggesting chronic rather than acute embolic event; b. 10/6832 LE U/S:  No DVT; c. 12/2016 Echo: Nl RV fxn, nl PASP.  Marland Kitchen Recurrent Metastatic breast cancer ([place])    a. Dx 2016: Stage II, ER positive, PR positive, HER-2/neu overexpressing of the left breast-->chemo/radiation; b. 10/2016 CT Abd/pelvis: diffuse liver mets, ill defined sclerotic bone lesions-T12;  c. 10/2016 MRI brain: metastatic lesion along L temporal lobe (19x34m) w/ extensive surrounding edema & 576mmidline shift to right.  . Sinus tachycardia     Patient Active Problem List   Diagnosis Date Noted  . Brain metastasis ([place]/2018  . Blood in stool   . Polyp of sigmoid colon   . Benign neoplasm of transverse colon   . Benign neoplasm of ascending colon   . Lower GI bleed   . Weakness of right lower extremity 02/11/2017  . Orthostatic lightheadedness 01/14/2017  . Atrial tachycardia ([place]  01/14/2017  . Hypokalemia 01/14/2017  . Pulmonary emboli ([place]) 01/12/2017  . Dehydration 12/25/2016  . Blood loss anemia 12/15/2016  . Ulceration of colon   . Heartburn   . Duodenitis   . Gastritis without bleeding   . GI bleed   . Acute esophagitis   . Acute esophagogastric ulcer   . Rectal bleed   . Hypovolemic shock ([place])   . Ischemic leg 12/04/2016  . Chemotherapy induced neutropenia ([place]) 11/20/2016  . Palliative care by specialist   . Goals of care, counseling/discussion   . DNR (do  not resuscitate) discussion   . Cerebral edema ([place]) 11/10/2016  . Encounter for monitoring cardiotoxic drug therapy 11/09/2016  . Elevated LFTs 10/20/2016  . Peripheral neuropathy due to chemotherapy ([place]) 03/13/2016  . Encounter for antineoplastic chemotherapy 03/13/2016  . HLD (hyperlipidemia) 01/29/2016  . Hyperglycemia, unspecified 01/29/2016  . Cardiomyopathy ([place]) 01/13/2016  . Hypertension 07/18/2015  . Carcinoma of upper-outer quadrant of left breast in female, estrogen receptor positive ([place]) 12/27/2014    Past Surgical History:  Procedure Laterality Date  . BREAST BIOPSY Left 2016   Positive  . BREAST LUMPECTOMY WITH SENTINEL LYMPH NODE BIOPSY Left 05/23/2015   Procedure: LEFT BREAST WIDE EXCISION WITH AXILLARY DISSECTION, MASTOPLASTY ;  Surgeon: [name], MD;  Location: [facility] ORS;  Service: General;  Laterality: Left;  . BREAST SURGERY Left 12/18/14   breast biopsy/INVASIVE DUCTAL CARCINOMA OF BREAST, NOTTINGHAM GRADE 2.  Marland Kitchen BREAST SURGERY  05/23/2015.   Wide excision/mastoplasty, axillary dissection. No residual invasive cancer, positive for residual DCIS. 0/2 nodes identified on axillary dissection. (no SLN by technetium or methylene blue)  . CARDIAC CATHETERIZATION    . COLONOSCOPY WITH PROPOFOL N/A 12/10/2016   Procedure: COLONOSCOPY WITH PROPOFOL;  Surgeon: Lucilla Lame, MD;  Location: [facility] ENDOSCOPY;  Service: Endoscopy;  Laterality: N/A;  . COLONOSCOPY WITH PROPOFOL N/A 02/13/2017   Procedure: COLONOSCOPY WITH PROPOFOL;  Surgeon: Lucilla Lame, MD;  Location: [facility] ENDOSCOPY;  Service: Endoscopy;  Laterality: N/A;  . ESOPHAGOGASTRODUODENOSCOPY (EGD) WITH PROPOFOL N/A 12/08/2016   Procedure: ESOPHAGOGASTRODUODENOSCOPY (EGD) WITH PROPOFOL;  Surgeon: Lucilla Lame, MD;  Location: [facility] ENDOSCOPY;  Service: Endoscopy;  Laterality: N/A;  . IVC FILTER INSERTION N/A 02/15/2017   Procedure: IVC Filter Insertion;  Surgeon: Algernon Huxley, MD;  Location: [place] CV LAB;   Service: Cardiovascular;  Laterality: N/A;  . PORTACATH PLACEMENT Right 12-31-14   Dr Bary Castilla    Prior to Admission medications   Medication Sig Start Date End Date Taking? Authorizing Provider  acetic acid-hydrocortisone (VOSOL-HC) OTIC solution Place 2 drops into both ears daily as needed. 01/18/18   Poulose, Bethel Born, NP  baclofen (LIORESAL) 10 MG tablet Take 1 tablet (10 mg total) by mouth 3 (three) times daily. 12/27/17   Triplett, Cari B, FNP  dexamethasone (DECADRON) 4 MG tablet Take 1 tablet (4 mg total) by mouth daily. 12/23/17   Noreene Filbert, MD  feeding supplement, ENSURE ENLIVE, (ENSURE ENLIVE) LIQD Take 237 mLs by mouth 2 (two) times daily between meals. 12/07/16   Fritzi Mandes, MD  flecainide (TAMBOCOR) 50 MG tablet Take 1 tablet (50 mg total) by mouth 2 (two) times daily. 09/10/17   Wellington Hampshire, MD  fluticasone (FLONASE) 50 MCG/ACT nasal spray Place 2 sprays into both nostrils daily. 12/09/17   Steele Sizer, MD  levocetirizine (XYZAL) 5 MG tablet Take 1 tablet (5 mg total) by mouth every evening. 01/18/18   Poulose, Bethel Born, NP  magnesium oxide (MAG-OX) 400  MG tablet Take 1 tablet (400 mg total) by mouth 2 (two) times daily. 03/25/17   Dunn, Areta Haber, PA-C  oxyCODONE (OXY IR/ROXICODONE) 5 MG immediate release tablet Take 1 tablet (5 mg total) by mouth every 8 (eight) hours as needed for severe pain. 09/08/17   Cammie Sickle, MD  potassium chloride (KLOR-CON) 20 MEQ packet Take by mouth 2 (two) times daily.    [provider]  pregabalin (LYRICA) 75 MG capsule Take 1 capsule (75 mg total) by mouth 2 (two) times daily. 10/07/17   Verlon Au, NP  traMADol (ULTRAM) 50 MG tablet Take 1 tablet (50 mg total) by mouth every 6 (six) hours as needed. 12/27/17   Triplett, Cari B, FNP  VERZENIO 50 MG tablet TAKE ONE TABLET (50 MG TOTAL) BY MOUTH ONCE DAILY FOR 21 DAYS THEN OFF FOR 7 DAYS 11/18/17   Cammie Sickle, MD  Vitamin D, Ergocalciferol, (DRISDOL) 50000  units CAPS capsule TAKE 1 CAPSULE (50,000 UNITS TOTAL) ONCE A WEEK BY MOUTH. 01/31/18   Cammie Sickle, MD    Allergies No known allergies  Family History  Problem Relation Age of Onset  . Breast cancer Maternal Aunt   . Breast cancer Cousin   . Brain cancer Maternal Uncle   . Diabetes Mother   . Hypertension Mother   . Stroke Mother     Social History Social History   Tobacco Use  . Smoking status: Light Tobacco Smoker    Packs/day: 0.50    Years: 18.00    Pack years: 9.00    Types: Cigarettes  . Smokeless tobacco: Never Used  Substance Use Topics  . Alcohol use: No    Alcohol/week: 0.0 oz  . Drug use: No    Review of Systems Constitutional: No fever/chills Eyes: No visual changes. ENT: No sore throat. Cardiovascular: Denies chest pain. Respiratory: Denies shortness of breath. Gastrointestinal: No abdominal pain.  No diarrhea.  No constipation. Genitourinary: Negative for dysuria. Musculoskeletal: Negative for back pain. Skin: Negative for rash.  Reports she is also noticed a slight whitehead just below her right lip.  She is recently been on antibiotics for a abscess in her lower groin region, but ports that has gone away now. Neurological: Negative for headaches, focal weakness or numbness.    ____________________________________________   PHYSICAL EXAM:  VITAL SIGNS: ED Triage Vitals  Enc Vitals Group     BP 02/20/18 0945 130/81     Pulse Rate 02/20/18 0945 (!) 120     Resp 02/20/18 0945 18     Temp 02/20/18 0945 98.8 F (37.1 C)     Temp Source 02/20/18 0945 Oral     SpO2 02/20/18 0945 100 %     Weight 02/20/18 0946 169 lb (76.7 kg)     Height 02/20/18 0946 5' 6"  (1.676 m)     Head Circumference --      Peak Flow --      Pain Score 02/20/18 0951 0     Pain Loc --      Pain Edu? --      Excl. in [place]? --     Constitutional: Alert and oriented. Well appearing and in no acute distress. Eyes: Conjunctivae are normal. Head: Atraumatic.   Patient has a very tiny, almost barely perceivable what I would describe as "whitehead" are small pustule just below the right lip vermilion border.  There is no surrounding erythema.  It is not large with any evidence of abscess.  Basically I would best described as a very small "pimple" Nose: No congestion/rhinnorhea. Mouth/Throat: Mucous membranes are moist. Neck: No stridor.   Cardiovascular: Normal rate, regular rhythm. Grossly normal heart sounds.  Good peripheral circulation.  Right upper chest port clean dry intact site. Respiratory: Normal respiratory effort.  No retractions. Lungs CTAB. Gastrointestinal: Soft and nontender. No distention.  No rebound or guarding any quadrant.  No tenderness in any quadrant.  Negative Murphy.  No pain to McBurney's point. Musculoskeletal: No lower extremity tenderness nor edema. Neurologic:  Normal speech and language. No gross focal neurologic deficits are appreciated.  Skin:  Skin is warm, dry and intact. No rash noted. Psychiatric: Mood and affect are normal. Speech and behavior are normal.  ____________________________________________   LABS (all labs ordered are listed, but only abnormal results are displayed)  Labs Reviewed  CBC WITH DIFFERENTIAL/PLATELET - Abnormal; Notable for the following components:      Result Value   RDW 15.5 (*)    Lymphs Abs 0.7 (*)    All other components within normal limits  COMPREHENSIVE METABOLIC PANEL - Abnormal; Notable for the following components:   Sodium 134 (*)    Calcium 8.8 (*)    Total Bilirubin 1.3 (*)    All other components within normal limits  URINALYSIS, COMPLETE (UACMP) WITH MICROSCOPIC - Abnormal; Notable for the following components:   Color, Urine YELLOW (*)    APPearance CLEAR (*)    All other components within normal limits  TROPONIN I   ____________________________________________  EKG  Reviewed and entered by me at 1300 Heart rate 129 cures 80 QTc 440 Possibly ectopic  atrial tachycardia. No evidence of acute ischemia.  Patient does have a history of persistent tachycardia followed by cardiology of note. ____________________________________________  RADIOLOGY    Abdominal x-ray negative for acute CT the head negative for acute new finding.  Redemonstration of her left temporal lesion without definite change.  Reviewed by me. ____________________________________________   PROCEDURES  Procedure(s) performed: None  Procedures  Critical Care performed: No  ____________________________________________   INITIAL IMPRESSION / ASSESSMENT AND PLAN / ED COURSE  Pertinent labs & imaging results that were available during my care of the patient were reviewed by me and considered in my medical decision making (see chart for details).  Patient was for evaluation of nausea and vomiting.  No spontaneous acute neurologic cardiac or pulmonary symptoms.  Denies abdominal pain.  Very reassuring exam.  The clinical history seem to suggest this may be related to her increased dose of her chemotherapeutic, she started having the nausea and reports that seems to revolve around taking her increased dose of the oral medication prescribed by Dr. Rogue Bussing.  Because of her history of metastatic cancer, previous brain lesions however given her nausea without associated abdominal pain have obtained a CT of the head which is negative for obvious or progressive disease that would explain her nausea today.  Her abdominal film is negative for any evidence of ileus or obstruction.  She denies pain in any quadrant.  Very reassuring clinical examination.  Also check urinalysis.  Provide antiemetics, hydration.  Continue to observe her closely.  No evidence of acute abdomen.  ----------------------------------------- 12:47 PM on 02/20/2018 -----------------------------------------  Patient is given urine sample.  She is requesting be discharged reports she feels much better now  after Zofran and IV fluid.  No complaint at the present time.  If patient oncology to discuss case and care, await urinalysis and plan for further follow-up.  Clinical Course as of Feb 21 1303  Sun Feb 20, 2018  1254 She has chronically elevated heart rate.  Review of notes indicates the patient does have chronic elevation of heart rate between 120-140s, she is currently back on her flecainide but has not taken it today.   [MQ]  24 Dr. Janece Canterbury recommends stop Xeloda and follow-up closely with Dr. Ignatius Specking in the clinic. Dr. Janece Canterbury will have oncology follow-up with the patient. Recommends no medication changes.     [MQ]    Clinical Course User Index [MQ] Delman Kitten, MD   Return precautions and treatment recommendations and follow-up discussed with the patient who is agreeable with the plan.   ____________________________________________   FINAL CLINICAL IMPRESSION(S) / ED DIAGNOSES  Final diagnoses:  Chronic tachycardia  Medication side effect, initial encounter  Non-intractable vomiting with nausea, unspecified vomiting type      NEW MEDICATIONS STARTED DURING THIS VISIT:  Current Discharge Medication List       Note:  This document was prepared using Dragon voice recognition software and may include unintentional dictation errors.     Delman Kitten, MD 02/20/18 1304

## 2018-02-20 NOTE — ED Triage Notes (Signed)
Pt reports has been vomiting for past 3 days reports today just having nausea reports started on a new Chemo medication capecitabine 2000mg  for breast CA. Pt reports feeling fatigued. Pt talks in complete sentences no distress noted

## 2018-02-20 NOTE — ED Notes (Signed)
Spoke with Dr.Williams about pt's presentation new orders received

## 2018-02-20 NOTE — ED Notes (Signed)
Pt returned from xray. Awaiting CT scan at this time.

## 2018-02-20 NOTE — ED Notes (Signed)
Pt states she has breast CA that has metastasized to brain, liver, lung. States she started a new chemo pill Tuesday. States a day or two later starting feeling weak, fatigued, N&V. Alert, oriented, no distress noted. Speaking in complete sentences. States she was supposed to start this new chemo pill earlier but was on antibiotics d/t staph infection on leg, thinks now she has it under bottom lip on R side. States "it has a white head on it." pt concerned she is dehydrated.

## 2018-02-20 NOTE — ED Notes (Signed)
Pt ambulatory to toilet to attempt to collect urine sample.

## 2018-02-20 NOTE — ED Notes (Signed)
Accessed pt port R chest.

## 2018-02-20 NOTE — ED Notes (Signed)
Dr. Quale at bedside.  

## 2018-02-22 ENCOUNTER — Telehealth: Payer: Self-pay | Admitting: *Deleted

## 2018-02-22 ENCOUNTER — Telehealth: Payer: Self-pay | Admitting: Internal Medicine

## 2018-02-22 NOTE — Telephone Encounter (Signed)
Evagelia-please inform patient to continue to hold Xeloda for now; follow-up with me as planned on June 3rd; will discuss regarding dose reduction at that visit. Thx

## 2018-02-22 NOTE — Telephone Encounter (Signed)
DR. B- per ER note, "Dr. Janece Canterbury recommends stop Xeloda and follow-up closely with Dr. Ignatius Specking in the clinic. Dr. Janece Canterbury will have oncology follow-up with the patient. Recommends no medication changes."

## 2018-02-22 NOTE — Telephone Encounter (Signed)
Patient informed to hold Capecitabine for now and keep appointment as scheduled for 6/3. She is in agreement with this plan

## 2018-02-22 NOTE — Telephone Encounter (Signed)
Patient called to report that she was in Er Sunday with dehydration and N/V. She was told that her new chemotherapy probably is the cause and that she is to check with Dr B regarding dose reducing. Please advise her next appointment is 02/28/18

## 2018-02-23 ENCOUNTER — Ambulatory Visit (INDEPENDENT_AMBULATORY_CARE_PROVIDER_SITE_OTHER): Payer: BLUE CROSS/BLUE SHIELD | Admitting: Orthotics

## 2018-02-23 DIAGNOSIS — Q828 Other specified congenital malformations of skin: Secondary | ICD-10-CM

## 2018-02-23 NOTE — Progress Notes (Signed)
Patient came into today to be cast for [employer]. Upon recommendation of Dr.  Prudence Davidson Patient presents with keratomas 3rd lett, 4th right. 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

## 2018-02-28 ENCOUNTER — Other Ambulatory Visit: Payer: Self-pay

## 2018-02-28 ENCOUNTER — Inpatient Hospital Stay: Payer: BLUE CROSS/BLUE SHIELD | Attending: Internal Medicine

## 2018-02-28 ENCOUNTER — Encounter: Payer: Self-pay | Admitting: Internal Medicine

## 2018-02-28 ENCOUNTER — Inpatient Hospital Stay (HOSPITAL_BASED_OUTPATIENT_CLINIC_OR_DEPARTMENT_OTHER): Payer: BLUE CROSS/BLUE SHIELD | Admitting: Internal Medicine

## 2018-02-28 VITALS — BP 108/75 | HR 112 | Temp 97.5°F | Resp 12 | Ht 66.0 in | Wt 166.2 lb

## 2018-02-28 DIAGNOSIS — C787 Secondary malignant neoplasm of liver and intrahepatic bile duct: Secondary | ICD-10-CM

## 2018-02-28 DIAGNOSIS — I252 Old myocardial infarction: Secondary | ICD-10-CM | POA: Insufficient documentation

## 2018-02-28 DIAGNOSIS — R5383 Other fatigue: Secondary | ICD-10-CM | POA: Diagnosis not present

## 2018-02-28 DIAGNOSIS — Z170 Estrogen receptor positive status [ER+]: Secondary | ICD-10-CM

## 2018-02-28 DIAGNOSIS — C50412 Malignant neoplasm of upper-outer quadrant of left female breast: Secondary | ICD-10-CM

## 2018-02-28 DIAGNOSIS — Z86711 Personal history of pulmonary embolism: Secondary | ICD-10-CM

## 2018-02-28 DIAGNOSIS — Z8719 Personal history of other diseases of the digestive system: Secondary | ICD-10-CM

## 2018-02-28 DIAGNOSIS — L271 Localized skin eruption due to drugs and medicaments taken internally: Secondary | ICD-10-CM | POA: Insufficient documentation

## 2018-02-28 DIAGNOSIS — Z803 Family history of malignant neoplasm of breast: Secondary | ICD-10-CM | POA: Diagnosis not present

## 2018-02-28 DIAGNOSIS — G20 Parkinson's disease: Secondary | ICD-10-CM

## 2018-02-28 DIAGNOSIS — G620 Drug-induced polyneuropathy: Secondary | ICD-10-CM | POA: Insufficient documentation

## 2018-02-28 DIAGNOSIS — I10 Essential (primary) hypertension: Secondary | ICD-10-CM | POA: Insufficient documentation

## 2018-02-28 DIAGNOSIS — L819 Disorder of pigmentation, unspecified: Secondary | ICD-10-CM

## 2018-02-28 DIAGNOSIS — M8580 Other specified disorders of bone density and structure, unspecified site: Secondary | ICD-10-CM

## 2018-02-28 DIAGNOSIS — Z9221 Personal history of antineoplastic chemotherapy: Secondary | ICD-10-CM

## 2018-02-28 DIAGNOSIS — Z79899 Other long term (current) drug therapy: Secondary | ICD-10-CM | POA: Diagnosis not present

## 2018-02-28 DIAGNOSIS — T451X5S Adverse effect of antineoplastic and immunosuppressive drugs, sequela: Secondary | ICD-10-CM | POA: Diagnosis not present

## 2018-02-28 DIAGNOSIS — R5382 Chronic fatigue, unspecified: Secondary | ICD-10-CM | POA: Insufficient documentation

## 2018-02-28 DIAGNOSIS — C7931 Secondary malignant neoplasm of brain: Secondary | ICD-10-CM

## 2018-02-28 DIAGNOSIS — R5381 Other malaise: Secondary | ICD-10-CM | POA: Insufficient documentation

## 2018-02-28 DIAGNOSIS — F17210 Nicotine dependence, cigarettes, uncomplicated: Secondary | ICD-10-CM | POA: Diagnosis not present

## 2018-02-28 DIAGNOSIS — Z79818 Long term (current) use of other agents affecting estrogen receptors and estrogen levels: Secondary | ICD-10-CM

## 2018-02-28 DIAGNOSIS — E785 Hyperlipidemia, unspecified: Secondary | ICD-10-CM | POA: Insufficient documentation

## 2018-02-28 LAB — COMPREHENSIVE METABOLIC PANEL
ALBUMIN: 3.9 g/dL (ref 3.5–5.0)
ALT: 21 U/L (ref 14–54)
ANION GAP: 10 (ref 5–15)
AST: 35 U/L (ref 15–41)
Alkaline Phosphatase: 74 U/L (ref 38–126)
BUN: 14 mg/dL (ref 6–20)
CHLORIDE: 102 mmol/L (ref 101–111)
CO2: 26 mmol/L (ref 22–32)
Calcium: 9.5 mg/dL (ref 8.9–10.3)
Creatinine, Ser: 0.89 mg/dL (ref 0.44–1.00)
GFR calc Af Amer: >60 mL/min (ref >60)
GFR calc non Af Amer: >60 mL/min (ref >60)
GLUCOSE: 102 mg/dL — AB (ref 65–99)
Potassium: 3.9 mmol/L (ref 3.5–5.1)
SODIUM: 138 mmol/L (ref 135–145)
Total Bilirubin: 0.8 mg/dL (ref 0.3–1.2)
Total Protein: 7.4 g/dL (ref 6.5–8.1)

## 2018-02-28 LAB — CBC WITH DIFFERENTIAL/PLATELET
Basophils Absolute: 0 10*3/uL (ref 0–0.1)
Basophils Relative: 1 %
Eosinophils Absolute: 0.1 10*3/uL (ref 0–0.7)
Eosinophils Relative: 4 %
HEMATOCRIT: 41.3 % (ref 35.0–47.0)
HEMOGLOBIN: 14.3 g/dL (ref 12.0–16.0)
LYMPHS ABS: 0.7 10*3/uL — AB (ref 1.0–3.6)
LYMPHS PCT: 25 %
MCH: 34.3 pg — AB (ref 26.0–34.0)
MCHC: 34.7 g/dL (ref 32.0–36.0)
MCV: 98.9 fL (ref 80.0–100.0)
MONOS PCT: 10 %
Monocytes Absolute: 0.3 10*3/uL (ref 0.2–0.9)
NEUTROS ABS: 1.8 10*3/uL (ref 1.4–6.5)
NEUTROS PCT: 60 %
Platelets: 190 10*3/uL (ref 150–440)
RBC: 4.18 MIL/uL (ref 3.80–5.20)
RDW: 15.7 % — ABNORMAL HIGH (ref 11.5–14.5)
WBC: 2.9 10*3/uL — ABNORMAL LOW (ref 3.6–11.0)

## 2018-02-28 NOTE — Assessment & Plan Note (Addendum)
#  Recurrent metastatic breast cancer-ER PR positive HER-2 negative; CT scan April 2019-increasing 10 mm liver nodule; leptomeningeal disease on MRI.  Stable.  #Currently on Xeloda; poor tolerance needing emergency room visit.  #Recommend restarting Xeloda-at lower dose of 3 pills twice a day-1 week on; 1 week off.  # Brain metastases-recurrent/likely leptomeningeal disease;  S/p WBRT [April 2019].  Stable; Xeloda should have blood-brain barrier penetration.   #Peripheral neuropathy grade 1 stable.  Continue Lyrica.  #Hyperpigmentation/prior chemotherapy question Xeloda; discussed prophylaxis with urea cream/Vaseline for hand-foot syndrome.  # start 3 pills BID [6/day; 1 week-ON and 1 week OFF]; follow up in 2 weeks/labs X-MD;

## 2018-02-28 NOTE — Progress Notes (Signed)
Patient here for follow up. No changes since last visit.   

## 2018-02-28 NOTE — Progress Notes (Signed)
[facility] OFFICE PROGRESS NOTE  Patient Care Team: Steele Sizer, MD as PCP - General (Family Medicine) Bary Castilla, Forest Gleason, MD (General Surgery) [name], MD (Inactive) as Attending Physician (Internal Medicine)  Cancer Staging No matching staging information was found for the patient.   Oncology History   # LEFT BREAST IDC; STAGE II [cT2N1] ER >90%; PR- 50-90%; her 2 NEU- POS; s/p Neoadj chemo; AUG 2016- TCH+P s/p Lumpec & partial ALND- path CR; s/p RT [finished Nov 2016];  adj Herceptin; HELD for Jan 19th 2017 [in DEC 2016-EF dropped from 63 to 51%; FEB 27th EF-42.9%]; JAN 2016 START Arimidex; May 2017- EF- 60%; May 24th 2017-Re-start Herceptin q 3W; July 28th STOP herceptin [finished 57m/m2; sec to Low EF; however 2017 Oct EF= 67%; improved]  # DEC 4th 2017- Start Neratinib 4 pills; DEC 11th 3 pills; STOPPED.  # FEB 2018- RECURRENCE ER/PR positive; Her 2 NEGATIVE [liver Bx]  # MARCH 1st 2018- Tax-Cytoxan [poor tol]  # FEB 2018- Brain mets [SBRT; Dr.Crystal; finished Feb 28th 2018]  # March 2018- Eribulin- poor tolerance  # June 8th 2018- Started Faslodex + Abema [abema-multiple interruptions sec to neutropenia]  # MRI Brain- March 2019- Leptomeningeal mets [WBRT; No LP s/p WBRT- 01/09/2018]; STOP Abema [566mday-sec to severe cytopenia]+faslodex  # MAY 20tth 019- Start Xeloda    # PN- G-2; may 2017- Cymbalta 6022m;MARCH 2018-  acute vascular insuff of BIL LE [? Taxotere vasoconstriction on asprin]; # hemorragic shock LGIB [s/p colo; Dr.Wohl]  #  SVT- on flecainide.   # April 2016- Liver Bx- NEG;   # Drop in EF from Herceptin [recovered OCt 27th- EF 65%]  # BMD- jan 2017- osteopenia  -----------------------------------------------------------     [facility]- PDL-1 0%/NEG for BRCA; MSI-Stable; Positive for PI3K; ccnd-1; FGF**  -----------------------------------------------------------  Dx: Metastatic  Breast cancer [ER/PR-Pos; her -2 NEG] Stage IV; goals: palliative Current treatment- Xeloda [may 20th 2019]     Carcinoma of upper-outer quadrant of left breast in female, estrogen receptor positive ([place]    INTERVAL HISTORY:  Sarah Horne 49o.  female pleasant patient above history of metastatic ER PR positive HER-2/neu negative breast cancer currently on Xeloda is here for follow-up.  Patient started taking Xeloda approximately 2 weeks ago.  After approximately 5 days of Xeloda patient was seen in the emergency room a week ago-because of dehydration.  Patient had been vomiting with nausea.  Denies any diarrhea.  Patient is also concerned about hyperpigmentation hands.  She is chronic numbness in the feet which is not any worse.  She denies any worsening headaches.  Since stopping XelMila Merryels much better.  Review of Systems  Constitutional: Positive for malaise/fatigue. Negative for chills, diaphoresis, fever and weight loss.  HENT: Negative for nosebleeds and sore throat.   Eyes: Negative for double vision.  Respiratory: Negative for cough, hemoptysis, sputum production, shortness of breath and wheezing.   Cardiovascular: Negative for chest pain, palpitations, orthopnea and leg swelling.  Gastrointestinal: Positive for nausea and vomiting. Negative for abdominal pain, blood in stool, constipation, diarrhea, heartburn and melena.  Genitourinary: Negative for dysuria, frequency and urgency.  Musculoskeletal: Negative for back pain and joint pain.  Skin: Negative.  Negative for itching and rash (Hyperpigmentation of bilateral hand and feet).  Neurological: Negative for dizziness, tingling, focal weakness, weakness and headaches.  Endo/Heme/Allergies: Does not bruise/bleed easily.  Psychiatric/Behavioral: Negative for depression. The patient is not nervous/anxious and does not  have insomnia.       PAST MEDICAL HISTORY :  Past Medical History:  Diagnosis Date  .  Chemotherapy-induced peripheral neuropathy ([place])   . Epistaxis    a. 11/2016 in setting of asa/plavix-->silver nitrate cauterization.  . GI bleed    a. 11/2016 Admission w/ GIB and hypovolemic shock req 3u PRBC's;  b. 11/2016 ECG: gastritis & nonbleeding peptic ulcer; c. 11/2016 Conlonoscopy: rectal and sigmoid colonic ulcers.  . Heart attack ([place])    a. 1998 Cath @ [facility]: reportedly no intervention required.  Marland Kitchen Herceptin-induced cardiomyopathy ([place])    a. In the setting of Herceptin Rx for breast cancer (initiated 12/2014); b. 03/2015 MUGA EF 64%; b. 08/2015 MUGA: EF 51%; c. 10/2015 MUGA: EF 44%; d. 11/2015 Echo: EF 45-50%; e. 01/2016 MUGA: EF 60%; f. 06/2016 MUGA EF 65%; g. 10/2016 MUGA: EF 61%;  h. 12/2016 Echo: EF 55-60%, gr1 DD.  Marland Kitchen Hyperlipidemia   . Hypertension   . Neuropathy   . Possible PAD (peripheral artery disease) ([place])    a. 11/2016 LE cyanosis and weak pulses-->CTA w/o significant Ao-BiFem dzs. ? distal dzs-->ASA/Plavix initiated by vascular surgery.  Marland Kitchen PSVT (paroxysmal supraventricular tachycardia) ([place])    a. Dx 11/2016.  . Pulmonary embolism ([place])    a. 12/2016 CTA Chest: small nonocclusive PE in inferior segment of the Left lingula, somewhat eccentric filling defect suggesting chronic rather than acute embolic event; b. 12/4008 LE U/S:  No DVT; c. 12/2016 Echo: Nl RV fxn, nl PASP.  Marland Kitchen Recurrent Metastatic breast cancer ([place])    a. Dx 2016: Stage II, ER positive, PR positive, HER-2/neu overexpressing of the left breast-->chemo/radiation; b. 10/2016 CT Abd/pelvis: diffuse liver mets, ill defined sclerotic bone lesions-T12;  c. 10/2016 MRI brain: metastatic lesion along L temporal lobe (19x63m) w/ extensive surrounding edema & 578mmidline shift to right.  . Sinus tachycardia     PAST SURGICAL HISTORY :   Past Surgical History:  Procedure Laterality Date  . BREAST BIOPSY Left 2016   Positive  . BREAST LUMPECTOMY WITH SENTINEL LYMPH NODE BIOPSY Left 05/23/2015   Procedure: LEFT BREAST WIDE  EXCISION WITH AXILLARY DISSECTION, MASTOPLASTY ;  Surgeon: JeRobert BellowMD;  Location: [facility] ORS;  Service: General;  Laterality: Left;  . BREAST SURGERY Left 12/18/14   breast biopsy/INVASIVE DUCTAL CARCINOMA OF BREAST, NOTTINGHAM GRADE 2.  . Marland KitchenREAST SURGERY  05/23/2015.   Wide excision/mastoplasty, axillary dissection. No residual invasive cancer, positive for residual DCIS. 0/2 nodes identified on axillary dissection. (no SLN by technetium or methylene blue)  . CARDIAC CATHETERIZATION    . COLONOSCOPY WITH PROPOFOL N/A 12/10/2016   Procedure: COLONOSCOPY WITH PROPOFOL;  Surgeon: DaLucilla LameMD;  Location: [facility] ENDOSCOPY;  Service: Endoscopy;  Laterality: N/A;  . COLONOSCOPY WITH PROPOFOL N/A 02/13/2017   Procedure: COLONOSCOPY WITH PROPOFOL;  Surgeon: WoLucilla LameMD;  [facility];  Service: Endoscopy;  Laterality: N/A;  . ESOPHAGOGASTRODUODENOSCOPY (EGD) WITH PROPOFOL N/A 12/08/2016   Procedure: ESOPHAGOGASTRODUODENOSCOPY (EGD) WITH PROPOFOL;  Surgeon: DaLucilla LameMD;  Location: [facility] ENDOSCOPY;  Service: Endoscopy;  Laterality: N/A;  . IVC FILTER INSERTION N/A 02/15/2017   Procedure: IVC Filter Insertion;  Surgeon: DeAlgernon HuxleyMD;  [facility];  Service: Cardiovascular;  Laterality: N/A;  . PORTACATH PLACEMENT Right 12-31-14   Dr ByBary Castilla  FAMILY HISTORY :   Family History  Problem Relation Age of Onset  . Breast cancer Maternal Aunt   . Breast cancer Cousin   .  Brain cancer Maternal Uncle   . Diabetes Mother   . Hypertension Mother   . Stroke Mother     SOCIAL HISTORY:   Social History   Tobacco Use  . Smoking status: Light Tobacco Smoker    Packs/day: 0.50    Years: 18.00    Pack years: 9.00    Types: Cigarettes  . Smokeless tobacco: Never Used  Substance Use Topics  . Alcohol use: No    Alcohol/week: 0.0 oz  . Drug use: No    ALLERGIES:  is allergic to no known allergies.  MEDICATIONS:  Current Outpatient Medications   Medication Sig Dispense Refill  . acetic acid-hydrocortisone (VOSOL-HC) OTIC solution Place 2 drops into both ears daily as needed. 10 mL 0  . baclofen (LIORESAL) 10 MG tablet Take 1 tablet (10 mg total) by mouth 3 (three) times daily. 30 tablet 0  . dexamethasone (DECADRON) 4 MG tablet Take 1 tablet (4 mg total) by mouth daily. 20 tablet 0  . feeding supplement, ENSURE ENLIVE, (ENSURE ENLIVE) LIQD Take 237 mLs by mouth 2 (two) times daily between meals. 237 mL 12  . flecainide (TAMBOCOR) 50 MG tablet Take 1 tablet (50 mg total) by mouth 2 (two) times daily. 60 tablet 11  . fluticasone (FLONASE) 50 MCG/ACT nasal spray Place 2 sprays into both nostrils daily. 16 g 0  . levocetirizine (XYZAL) 5 MG tablet Take 1 tablet (5 mg total) by mouth every evening. 30 tablet 3  . magnesium oxide (MAG-OX) 400 MG tablet Take 1 tablet (400 mg total) by mouth 2 (two) times daily. 60 tablet 6  . oxyCODONE (OXY IR/ROXICODONE) 5 MG immediate release tablet Take 1 tablet (5 mg total) by mouth every 8 (eight) hours as needed for severe pain. 42 tablet 0  . potassium chloride (KLOR-CON) 20 MEQ packet Take by mouth 2 (two) times daily.    . pregabalin (LYRICA) 75 MG capsule Take 1 capsule (75 mg total) by mouth 2 (two) times daily. 60 capsule 3  . traMADol (ULTRAM) 50 MG tablet Take 1 tablet (50 mg total) by mouth every 6 (six) hours as needed. 12 tablet 0  . Vitamin D, Ergocalciferol, (DRISDOL) 50000 units CAPS capsule TAKE 1 CAPSULE (50,000 UNITS TOTAL) ONCE A WEEK BY MOUTH. 12 capsule 1   No current facility-administered medications for this visit.    Facility-Administered Medications Ordered in Other Visits  Medication Dose Route Frequency Provider Last Rate Last Dose  . 0.9 %  sodium chloride infusion   Intravenous Continuous Pandit, Sandeep, MD      . 0.9 %  sodium chloride infusion   Intravenous Continuous Lloyd Huger, MD 999 mL/hr at 04/24/15 1520    . heparin lock flush 100 unit/mL  500 Units  Intravenous Once Berenzon, Dmitriy, MD      . sodium chloride 0.9 % injection 10 mL  10 mL Intravenous PRN [name], MD   10 mL at 04/04/15 1440  . sodium chloride flush (NS) 0.9 % injection 10 mL  10 mL Intravenous PRN Berenzon, Dmitriy, MD      . Tbo-Filgrastim (GRANIX) injection 480 mcg  480 mcg Subcutaneous Once Cammie Sickle, MD        PHYSICAL EXAMINATION: ECOG PERFORMANCE STATUS: 1 - Symptomatic but completely ambulatory  BP 108/75 (BP Location: Right Arm, Patient Position: Sitting)   Pulse (!) 112   Temp (!) 97.5 F (36.4 C) (Tympanic)   Resp 12   Ht 5' 6"  (1.676 m)  Wt 166 lb 3.2 oz (75.4 kg)   BMI 26.83 kg/m   Filed Weights   02/28/18 0952  Weight: 166 lb 3.2 oz (75.4 kg)    GENERAL: Well-nourished well-developed; Alert, no distress and comfortable.  Alone. EYES: no pallor or icterus OROPHARYNX: no thrush or ulceration; NECK: supple; no lymph nodes felt. LYMPH:  no palpable lymphadenopathy in the axillary or inguinal regions LUNGS: Decreased breath sounds auscultation bilaterally. No wheeze or crackles HEART/CVS: regular rate & rhythm and no murmurs; No lower extremity edema ABDOMEN:abdomen soft, non-tender and normal bowel sounds. No hepatomegaly or splenomegaly.  Musculoskeletal:no cyanosis of digits and no clubbing  PSYCH: alert & oriented x 3 with fluent speech NEURO: no focal motor/sensory deficits SKIN: Hyperpigmentation of bilateral palms.  No desquamation.     LABORATORY DATA:  I have reviewed the data as listed    Component Value Date/Time   NA 138 02/28/2018 0937   NA 135 03/04/2017 0918   NA 135 01/08/2015 0850   K 3.9 02/28/2018 0937   K 3.7 01/08/2015 0850   CL 102 02/28/2018 0937   CL 101 01/08/2015 0850   CO2 26 02/28/2018 0937   CO2 26 01/08/2015 0850   GLUCOSE 102 (H) 02/28/2018 0937   GLUCOSE 161 (H) 01/08/2015 0850   BUN 14 02/28/2018 0937   BUN 8 03/04/2017 0918   BUN 17 01/08/2015 0850   CREATININE 0.89 02/28/2018  0937   CREATININE 0.82 01/22/2015 1559   CALCIUM 9.5 02/28/2018 0937   CALCIUM 9.3 01/08/2015 0850   PROT 7.4 02/28/2018 0937   PROT 6.4 03/04/2017 0918   PROT 6.8 01/22/2015 1559   ALBUMIN 3.9 02/28/2018 0937   ALBUMIN 2.5 (L) 03/04/2017 0918   ALBUMIN 3.9 01/22/2015 1559   AST 35 02/28/2018 0937   AST 34 01/22/2015 1559   ALT 21 02/28/2018 0937   ALT 42 01/22/2015 1559   ALKPHOS 74 02/28/2018 0937   ALKPHOS 157 (H) 01/22/2015 1559   BILITOT 0.8 02/28/2018 0937   BILITOT 6.2 (H) 03/04/2017 0918   BILITOT 0.2 (L) 01/22/2015 1559   GFRNONAA >60 02/28/2018 0937   GFRNONAA >60 01/22/2015 1559   GFRAA >60 02/28/2018 0937   GFRAA >60 01/22/2015 1559    No results found for: SPEP, UPEP  Lab Results  Component Value Date   WBC 2.9 (L) 02/28/2018   NEUTROABS 1.8 02/28/2018   HGB 14.3 02/28/2018   HCT 41.3 02/28/2018   MCV 98.9 02/28/2018   PLT 190 02/28/2018      Chemistry      Component Value Date/Time   NA 138 02/28/2018 0937   NA 135 03/04/2017 0918   NA 135 01/08/2015 0850   K 3.9 02/28/2018 0937   K 3.7 01/08/2015 0850   CL 102 02/28/2018 0937   CL 101 01/08/2015 0850   CO2 26 02/28/2018 0937   CO2 26 01/08/2015 0850   BUN 14 02/28/2018 0937   BUN 8 03/04/2017 0918   BUN 17 01/08/2015 0850   CREATININE 0.89 02/28/2018 0937   CREATININE 0.82 01/22/2015 1559      Component Value Date/Time   CALCIUM 9.5 02/28/2018 0937   CALCIUM 9.3 01/08/2015 0850   ALKPHOS 74 02/28/2018 0937   ALKPHOS 157 (H) 01/22/2015 1559   AST 35 02/28/2018 0937   AST 34 01/22/2015 1559   ALT 21 02/28/2018 0937   ALT 42 01/22/2015 1559   BILITOT 0.8 02/28/2018 0937   BILITOT 6.2 (H) 03/04/2017 0918   BILITOT 0.2 (  L) 01/22/2015 1559       RADIOGRAPHIC STUDIES: I have personally reviewed the radiological images as listed and agreed with the findings in the report. No results found.   ASSESSMENT & PLAN:  Carcinoma of upper-outer quadrant of left breast in female, estrogen  receptor positive ([place]) #Recurrent metastatic breast cancer-ER PR positive HER-2 negative; CT scan April 2019-increasing 10 mm liver nodule; leptomeningeal disease on MRI.  Stable.  #Currently on Xeloda; poor tolerance needing emergency room visit.  #Recommend restarting Xeloda-at lower dose of 3 pills twice a day-1 week on; 1 week off.  # Brain metastases-recurrent/likely leptomeningeal disease;  S/p WBRT [April 2019].  Stable; Xeloda should have blood-brain barrier penetration.   #Peripheral neuropathy grade 1 stable.  Continue Lyrica.  #Hyperpigmentation/prior chemotherapy question Xeloda; discussed prophylaxis with urea cream/Vaseline for hand-foot syndrome.  # start 3 pills BID [6/day; 1 week-ON and 1 week OFF]; follow up in 2 weeks/labs X-MD;    No orders of the defined types were placed in this encounter.  All questions were answered. The patient knows to call the clinic with any problems, questions or concerns.      Cammie Sickle, MD 02/28/2018 11:26 PM

## 2018-03-01 LAB — CANCER ANTIGEN 27.29: CA 27.29: 15.6 U/mL (ref 0.0–38.6)

## 2018-03-14 ENCOUNTER — Inpatient Hospital Stay: Payer: BLUE CROSS/BLUE SHIELD

## 2018-03-14 ENCOUNTER — Encounter: Payer: Self-pay | Admitting: Oncology

## 2018-03-14 ENCOUNTER — Other Ambulatory Visit: Payer: Self-pay

## 2018-03-14 ENCOUNTER — Inpatient Hospital Stay (HOSPITAL_BASED_OUTPATIENT_CLINIC_OR_DEPARTMENT_OTHER): Payer: BLUE CROSS/BLUE SHIELD | Admitting: Oncology

## 2018-03-14 VITALS — BP 116/78 | HR 115 | Temp 97.7°F | Resp 18 | Wt 170.7 lb

## 2018-03-14 DIAGNOSIS — R5382 Chronic fatigue, unspecified: Secondary | ICD-10-CM

## 2018-03-14 DIAGNOSIS — C50412 Malignant neoplasm of upper-outer quadrant of left female breast: Secondary | ICD-10-CM

## 2018-03-14 DIAGNOSIS — I10 Essential (primary) hypertension: Secondary | ICD-10-CM | POA: Diagnosis not present

## 2018-03-14 DIAGNOSIS — C7931 Secondary malignant neoplasm of brain: Secondary | ICD-10-CM | POA: Diagnosis not present

## 2018-03-14 DIAGNOSIS — Z170 Estrogen receptor positive status [ER+]: Secondary | ICD-10-CM | POA: Diagnosis not present

## 2018-03-14 DIAGNOSIS — Z79818 Long term (current) use of other agents affecting estrogen receptors and estrogen levels: Secondary | ICD-10-CM | POA: Diagnosis not present

## 2018-03-14 DIAGNOSIS — Z9221 Personal history of antineoplastic chemotherapy: Secondary | ICD-10-CM

## 2018-03-14 DIAGNOSIS — C787 Secondary malignant neoplasm of liver and intrahepatic bile duct: Secondary | ICD-10-CM | POA: Diagnosis not present

## 2018-03-14 DIAGNOSIS — E785 Hyperlipidemia, unspecified: Secondary | ICD-10-CM

## 2018-03-14 DIAGNOSIS — M8580 Other specified disorders of bone density and structure, unspecified site: Secondary | ICD-10-CM | POA: Diagnosis not present

## 2018-03-14 DIAGNOSIS — L271 Localized skin eruption due to drugs and medicaments taken internally: Secondary | ICD-10-CM | POA: Diagnosis not present

## 2018-03-14 DIAGNOSIS — G620 Drug-induced polyneuropathy: Secondary | ICD-10-CM

## 2018-03-14 DIAGNOSIS — I252 Old myocardial infarction: Secondary | ICD-10-CM

## 2018-03-14 DIAGNOSIS — Z803 Family history of malignant neoplasm of breast: Secondary | ICD-10-CM

## 2018-03-14 DIAGNOSIS — R5383 Other fatigue: Secondary | ICD-10-CM | POA: Diagnosis not present

## 2018-03-14 DIAGNOSIS — R5381 Other malaise: Secondary | ICD-10-CM | POA: Diagnosis not present

## 2018-03-14 DIAGNOSIS — T451X5S Adverse effect of antineoplastic and immunosuppressive drugs, sequela: Secondary | ICD-10-CM

## 2018-03-14 DIAGNOSIS — Z8719 Personal history of other diseases of the digestive system: Secondary | ICD-10-CM

## 2018-03-14 DIAGNOSIS — L819 Disorder of pigmentation, unspecified: Secondary | ICD-10-CM | POA: Diagnosis not present

## 2018-03-14 DIAGNOSIS — C50919 Malignant neoplasm of unspecified site of unspecified female breast: Secondary | ICD-10-CM

## 2018-03-14 DIAGNOSIS — F17210 Nicotine dependence, cigarettes, uncomplicated: Secondary | ICD-10-CM

## 2018-03-14 DIAGNOSIS — Z86711 Personal history of pulmonary embolism: Secondary | ICD-10-CM

## 2018-03-14 LAB — CBC WITH DIFFERENTIAL/PLATELET
BASOS ABS: 0 10*3/uL (ref 0–0.1)
Basophils Relative: 0 %
EOS PCT: 3 %
Eosinophils Absolute: 0.1 10*3/uL (ref 0–0.7)
HCT: 38.2 % (ref 35.0–47.0)
HEMOGLOBIN: 13.1 g/dL (ref 12.0–16.0)
LYMPHS ABS: 0.7 10*3/uL — AB (ref 1.0–3.6)
LYMPHS PCT: 25 %
MCH: 34.3 pg — AB (ref 26.0–34.0)
MCHC: 34.2 g/dL (ref 32.0–36.0)
MCV: 100.2 fL — AB (ref 80.0–100.0)
Monocytes Absolute: 0.3 10*3/uL (ref 0.2–0.9)
Monocytes Relative: 12 %
NEUTROS ABS: 1.6 10*3/uL (ref 1.4–6.5)
NEUTROS PCT: 60 %
PLATELETS: 135 10*3/uL — AB (ref 150–440)
RBC: 3.81 MIL/uL (ref 3.80–5.20)
RDW: 16.5 % — ABNORMAL HIGH (ref 11.5–14.5)
WBC: 2.7 10*3/uL — AB (ref 3.6–11.0)

## 2018-03-14 LAB — COMPREHENSIVE METABOLIC PANEL
ALT: 17 U/L (ref 14–54)
ANION GAP: 7 (ref 5–15)
AST: 28 U/L (ref 15–41)
Albumin: 3.7 g/dL (ref 3.5–5.0)
Alkaline Phosphatase: 72 U/L (ref 38–126)
BILIRUBIN TOTAL: 0.9 mg/dL (ref 0.3–1.2)
BUN: 12 mg/dL (ref 6–20)
CHLORIDE: 107 mmol/L (ref 101–111)
CO2: 24 mmol/L (ref 22–32)
Calcium: 8.7 mg/dL — ABNORMAL LOW (ref 8.9–10.3)
Creatinine, Ser: 0.79 mg/dL (ref 0.44–1.00)
Glucose, Bld: 127 mg/dL — ABNORMAL HIGH (ref 65–99)
POTASSIUM: 3.8 mmol/L (ref 3.5–5.1)
Sodium: 138 mmol/L (ref 135–145)
TOTAL PROTEIN: 7 g/dL (ref 6.5–8.1)

## 2018-03-14 NOTE — Progress Notes (Signed)
[facility] OFFICE PROGRESS NOTE  Patient Care Team: Steele Sizer, MD as PCP - General (Family Medicine) Bary Castilla, Forest Gleason, MD (General Surgery) [name], MD (Inactive) as Attending Physician (Internal Medicine)  Cancer Staging No matching staging information was found for the patient.   Oncology History   # LEFT BREAST IDC; STAGE II [cT2N1] ER >90%; PR- 50-90%; her 2 NEU- POS; s/p Neoadj chemo; AUG 2016- TCH+P s/p Lumpec & partial ALND- path CR; s/p RT [finished Nov 2016];  adj Herceptin; HELD for Jan 19th 2017 [in DEC 2016-EF dropped from 63 to 51%; FEB 27th EF-42.9%]; JAN 2016 START Arimidex; May 2017- EF- 60%; May 24th 2017-Re-start Herceptin q 3W; July 28th STOP herceptin [finished 64m/m2; sec to Low EF; however 2017 Oct EF= 67%; improved]  # DEC 4th 2017- Start Neratinib 4 pills; DEC 11th 3 pills; STOPPED.  # FEB 2018- RECURRENCE ER/PR positive; Her 2 NEGATIVE [liver Bx]  # MARCH 1st 2018- Tax-Cytoxan [poor tol]  # FEB 2018- Brain mets [SBRT; Dr.Crystal; finished Feb 28th 2018]  # March 2018- Eribulin- poor tolerance  # June 8th 2018- Started Faslodex + Abema [abema-multiple interruptions sec to neutropenia]  # MRI Brain- March 2019- Leptomeningeal mets [WBRT; No LP s/p WBRT- 01/09/2018]; STOP Abema [532mday-sec to severe cytopenia]+faslodex  # MAY 20tth 019- Start Xeloda    # PN- G-2; may 2017- Cymbalta 6023m;MARCH 2018-  acute vascular insuff of BIL LE [? Taxotere vasoconstriction on asprin]; # hemorragic shock LGIB [s/p colo; Dr.Wohl]  #  SVT- on flecainide.   # April 2016- Liver Bx- NEG;   # Drop in EF from Herceptin [recovered OCt 27th- EF 65%]  # BMD- jan 2017- osteopenia  -----------------------------------------------------------     MOLBlakelye- Sep 4th 2018- PDL-1 0%/NEG for BRCA; MSI-Stable; Positive for PI3K; ccnd-1; FGF**  -----------------------------------------------------------  Dx: Metastatic  Breast cancer [ER/PR-Pos; her -2 NEG] Stage IV; goals: palliative Current treatment- Xeloda [may 20th 2019]     Carcinoma of upper-outer quadrant of left breast in female, estrogen receptor positive ([facility]    INTERVAL HISTORY:  Sarah Horne 68o.  female pleasant patient with history presents for chemotherapy for treatment of metastatic ER PR positive HER-2/new negative breast cancer.  I am covering Dr. B to see this patient.  Patient oncology history review was performed.                                                                                Patient was recently restarted on Xeloda, 1500m1mwice daily 1 week on and 1 week off.  Reports having bilateral palm and soles swelling and pain, dark skin color is well.  No skin peeling. Denies nausea vomiting diarrhea.                                                                 Chronic fatigue, stable  Review of Systems  Constitutional: Positive for malaise/fatigue. Negative  for chills, diaphoresis, fever and weight loss.  HENT: Negative for nosebleeds and sore throat.   Eyes: Negative for double vision.  Respiratory: Negative for cough, hemoptysis, sputum production, shortness of breath and wheezing.   Cardiovascular: Negative for chest pain, palpitations, orthopnea and leg swelling.  Gastrointestinal: Negative for abdominal pain, blood in stool, constipation, diarrhea, heartburn, melena, nausea and vomiting.  Genitourinary: Negative for dysuria, frequency and urgency.  Musculoskeletal: Negative for back pain and joint pain.  Skin: Negative for itching and rash (Hyperpigmentation of bilateral hand and feet).  Neurological: Negative for dizziness, tingling, focal weakness, weakness and headaches.  Endo/Heme/Allergies: Does not bruise/bleed easily.  Psychiatric/Behavioral: Negative for depression. The patient is not nervous/anxious and does not have insomnia.       PAST MEDICAL HISTORY :  Past Medical History:  Diagnosis  Date  . Chemotherapy-induced peripheral neuropathy ([place])   . Epistaxis    a. 11/2016 in setting of asa/plavix-->silver nitrate cauterization.  . GI bleed    a. 11/2016 Admission w/ GIB and hypovolemic shock req 3u PRBC's;  b. 11/2016 ECG: gastritis & nonbleeding peptic ulcer; c. 11/2016 Conlonoscopy: rectal and sigmoid colonic ulcers.  . Heart attack ([place])    a. 1998 Cath @ [facility]: reportedly no intervention required.  Marland Kitchen Herceptin-induced cardiomyopathy ([place])    a. In the setting of Herceptin Rx for breast cancer (initiated 12/2014); b. 03/2015 MUGA EF 64%; b. 08/2015 MUGA: EF 51%; c. 10/2015 MUGA: EF 44%; d. 11/2015 Echo: EF 45-50%; e. 01/2016 MUGA: EF 60%; f. 06/2016 MUGA EF 65%; g. 10/2016 MUGA: EF 61%;  h. 12/2016 Echo: EF 55-60%, gr1 DD.  Marland Kitchen Hyperlipidemia   . Hypertension   . Neuropathy   . Possible PAD (peripheral artery disease) ([place])    a. 11/2016 LE cyanosis and weak pulses-->CTA w/o significant Ao-BiFem dzs. ? distal dzs-->ASA/Plavix initiated by vascular surgery.  Marland Kitchen PSVT (paroxysmal supraventricular tachycardia) ([place])    a. Dx 11/2016.  . Pulmonary embolism ([place])    a. 12/2016 CTA Chest: small nonocclusive PE in inferior segment of the Left lingula, somewhat eccentric filling defect suggesting chronic rather than acute embolic event; b. 09/930 LE U/S:  No DVT; c. 12/2016 Echo: Nl RV fxn, nl PASP.  Marland Kitchen Recurrent Metastatic breast cancer ([place])    a. Dx 2016: Stage II, ER positive, PR positive, HER-2/neu overexpressing of the left breast-->chemo/radiation; b. 10/2016 CT Abd/pelvis: diffuse liver mets, ill defined sclerotic bone lesions-T12;  c. 10/2016 MRI brain: metastatic lesion along L temporal lobe (19x91m) w/ extensive surrounding edema & 545mmidline shift to right.  . Sinus tachycardia     PAST SURGICAL HISTORY :   Past Surgical History:  Procedure Laterality Date  . BREAST BIOPSY Left 2016   Positive  . BREAST LUMPECTOMY WITH SENTINEL LYMPH NODE BIOPSY Left 05/23/2015   Procedure: LEFT BREAST  WIDE EXCISION WITH AXILLARY DISSECTION, MASTOPLASTY ;  Surgeon: JeRobert BellowMD;  Location: [facility] ORS;  Service: General;  Laterality: Left;  . BREAST SURGERY Left 12/18/14   breast biopsy/INVASIVE DUCTAL CARCINOMA OF BREAST, NOTTINGHAM GRADE 2.  . Marland KitchenREAST SURGERY  05/23/2015.   Wide excision/mastoplasty, axillary dissection. No residual invasive cancer, positive for residual DCIS. 0/2 nodes identified on axillary dissection. (no SLN by technetium or methylene blue)  . CARDIAC CATHETERIZATION    . COLONOSCOPY WITH PROPOFOL N/A 12/10/2016   Procedure: COLONOSCOPY WITH PROPOFOL;  Surgeon: DaLucilla LameMD;  Location: [facility] ENDOSCOPY;  Service: Endoscopy;  Laterality: N/A;  . COLONOSCOPY WITH PROPOFOL N/A  02/13/2017   Procedure: COLONOSCOPY WITH PROPOFOL;  Surgeon: Lucilla Lame, MD;  Location[facility];  Service: Endoscopy;  Laterality: N/A;  . ESOPHAGOGASTRODUODENOSCOPY (EGD) WITH PROPOFOL N/A 12/08/2016   Procedure: ESOPHAGOGASTRODUODENOSCOPY (EGD) WITH PROPOFOL;  Surgeon: Lucilla Lame, MD;  Location: [facility] ENDOSCOPY;  Service: Endoscopy;  Laterality: N/A;  . IVC FILTER INSERTION N/A 02/15/2017   Procedure: IVC Filter Insertion;  Surgeon: Algernon Huxley, MD;  Location: [facility] LAB;  Service: Cardiovascular;  Laterality: N/A;  . PORTACATH PLACEMENT Right 12-31-14   Dr Bary Castilla    FAMILY HISTORY :   Family History  Problem Relation Age of Onset  . Breast cancer Maternal Aunt   . Breast cancer Cousin   . Brain cancer Maternal Uncle   . Diabetes Mother   . Hypertension Mother   . Stroke Mother     SOCIAL HISTORY:   Social History   Tobacco Use  . Smoking status: Light Tobacco Smoker    Packs/day: 0.50    Years: 18.00    Pack years: 9.00    Types: Cigarettes  . Smokeless tobacco: Never Used  Substance Use Topics  . Alcohol use: No    Alcohol/week: 0.0 oz  . Drug use: No    ALLERGIES:  is allergic to no known allergies.  MEDICATIONS:  Current Outpatient Medications   Medication Sig Dispense Refill  . flecainide (TAMBOCOR) 50 MG tablet Take 1 tablet (50 mg total) by mouth 2 (two) times daily. 60 tablet 11  . magnesium oxide (MAG-OX) 400 MG tablet Take 1 tablet (400 mg total) by mouth 2 (two) times daily. 60 tablet 6  . oxyCODONE (OXY IR/ROXICODONE) 5 MG immediate release tablet Take 1 tablet (5 mg total) by mouth every 8 (eight) hours as needed for severe pain. 42 tablet 0  . potassium chloride (KLOR-CON) 20 MEQ packet Take by mouth 2 (two) times daily.    . pregabalin (LYRICA) 75 MG capsule Take 1 capsule (75 mg total) by mouth 2 (two) times daily. 60 capsule 3  . Vitamin D, Ergocalciferol, (DRISDOL) 50000 units CAPS capsule TAKE 1 CAPSULE (50,000 UNITS TOTAL) ONCE A WEEK BY MOUTH. 12 capsule 1  . acetic acid-hydrocortisone (VOSOL-HC) OTIC solution Place 2 drops into both ears daily as needed. (Patient not taking: Reported on 03/14/2018) 10 mL 0  . baclofen (LIORESAL) 10 MG tablet Take 1 tablet (10 mg total) by mouth 3 (three) times daily. (Patient not taking: Reported on 03/14/2018) 30 tablet 0  . capecitabine (XELODA) 500 MG tablet     . dexamethasone (DECADRON) 4 MG tablet Take 1 tablet (4 mg total) by mouth daily. (Patient not taking: Reported on 03/14/2018) 20 tablet 0  . feeding supplement, ENSURE ENLIVE, (ENSURE ENLIVE) LIQD Take 237 mLs by mouth 2 (two) times daily between meals. (Patient not taking: Reported on 03/14/2018) 237 mL 12  . fluticasone (FLONASE) 50 MCG/ACT nasal spray Place 2 sprays into both nostrils daily. (Patient not taking: Reported on 03/14/2018) 16 g 0  . levocetirizine (XYZAL) 5 MG tablet Take 1 tablet (5 mg total) by mouth every evening. (Patient not taking: Reported on 03/14/2018) 30 tablet 3  . traMADol (ULTRAM) 50 MG tablet Take 1 tablet (50 mg total) by mouth every 6 (six) hours as needed. (Patient not taking: Reported on 03/14/2018) 12 tablet 0   No current facility-administered medications for this visit.    Facility-Administered  Medications Ordered in Other Visits  Medication Dose Route Frequency Provider Last Rate Last Dose  .  0.9 %  sodium chloride infusion   Intravenous Continuous Pandit, Sandeep, MD      . 0.9 %  sodium chloride infusion   Intravenous Continuous Lloyd Huger, MD 999 mL/hr at 04/24/15 1520    . heparin lock flush 100 unit/mL  500 Units Intravenous Once Berenzon, Dmitriy, MD      . sodium chloride 0.9 % injection 10 mL  10 mL Intravenous PRN [name], MD   10 mL at 04/04/15 1440  . sodium chloride flush (NS) 0.9 % injection 10 mL  10 mL Intravenous PRN Berenzon, Dmitriy, MD      . Tbo-Filgrastim (GRANIX) injection 480 mcg  480 mcg Subcutaneous Once Cammie Sickle, MD        PHYSICAL EXAMINATION: ECOG PERFORMANCE STATUS: 1 - Symptomatic but completely ambulatory  BP 116/78 (BP Location: Right Arm, Patient Position: Sitting)   Pulse (!) 115   Temp 97.7 F (36.5 C) (Tympanic)   Resp 18   Wt 170 lb 11.2 oz (77.4 kg)   BMI 27.55 kg/m   Filed Weights   03/14/18 1431  Weight: 170 lb 11.2 oz (77.4 kg)   Physical Exam  Constitutional: She is oriented to person, place, and time and well-developed, well-nourished, and in no distress. No distress.  HENT:  Head: Normocephalic and atraumatic.  Nose: Nose normal.  Mouth/Throat: Oropharynx is clear and moist. No oropharyngeal exudate.  Eyes: Pupils are equal, round, and reactive to light. EOM are normal. Left eye exhibits no discharge. No scleral icterus.  Neck: Normal range of motion. Neck supple. No JVD present.  Cardiovascular: Normal rate, regular rhythm and normal heart sounds.  No murmur heard. Pulmonary/Chest: Effort normal and breath sounds normal. No respiratory distress. She has no wheezes. She has no rales. She exhibits no tenderness.  Abdominal: Soft. She exhibits no distension and no mass. There is no tenderness. There is no rebound.  Musculoskeletal: Normal range of motion. She exhibits no edema or tenderness.   Lymphadenopathy:    She has no cervical adenopathy.  Neurological: She is alert and oriented to person, place, and time. No cranial nerve deficit. She exhibits normal muscle tone. Coordination normal.  Skin: Skin is warm and dry. She is not diaphoretic. No erythema.  Bilateral palm hyperpigmentation and swelling  Psychiatric: Affect and judgment normal.     LABORATORY DATA:  I have reviewed the data as listed    Component Value Date/Time   NA 138 03/14/2018 1420   NA 135 03/04/2017 0918   NA 135 01/08/2015 0850   K 3.8 03/14/2018 1420   K 3.7 01/08/2015 0850   CL 107 03/14/2018 1420   CL 101 01/08/2015 0850   CO2 24 03/14/2018 1420   CO2 26 01/08/2015 0850   GLUCOSE 127 (H) 03/14/2018 1420   GLUCOSE 161 (H) 01/08/2015 0850   BUN 12 03/14/2018 1420   BUN 8 03/04/2017 0918   BUN 17 01/08/2015 0850   CREATININE 0.79 03/14/2018 1420   CREATININE 0.82 01/22/2015 1559   CALCIUM 8.7 (L) 03/14/2018 1420   CALCIUM 9.3 01/08/2015 0850   PROT 7.0 03/14/2018 1420   PROT 6.4 03/04/2017 0918   PROT 6.8 01/22/2015 1559   ALBUMIN 3.7 03/14/2018 1420   ALBUMIN 2.5 (L) 03/04/2017 0918   ALBUMIN 3.9 01/22/2015 1559   AST 28 03/14/2018 1420   AST 34 01/22/2015 1559   ALT 17 03/14/2018 1420   ALT 42 01/22/2015 1559   ALKPHOS 72 03/14/2018 1420   ALKPHOS 157 (  H) 01/22/2015 1559   BILITOT 0.9 03/14/2018 1420   BILITOT 6.2 (H) 03/04/2017 0918   BILITOT 0.2 (L) 01/22/2015 1559   GFRNONAA >60 03/14/2018 1420   GFRNONAA >60 01/22/2015 1559   GFRAA >60 03/14/2018 1420   GFRAA >60 01/22/2015 1559    No results found for: SPEP, UPEP  Lab Results  Component Value Date   WBC 2.7 (L) 03/14/2018   NEUTROABS 1.6 03/14/2018   HGB 13.1 03/14/2018   HCT 38.2 03/14/2018   MCV 100.2 (H) 03/14/2018   PLT 135 (L) 03/14/2018      Chemistry      Component Value Date/Time   NA 138 03/14/2018 1420   NA 135 03/04/2017 0918   NA 135 01/08/2015 0850   K 3.8 03/14/2018 1420   K 3.7 01/08/2015  0850   CL 107 03/14/2018 1420   CL 101 01/08/2015 0850   CO2 24 03/14/2018 1420   CO2 26 01/08/2015 0850   BUN 12 03/14/2018 1420   BUN 8 03/04/2017 0918   BUN 17 01/08/2015 0850   CREATININE 0.79 03/14/2018 1420   CREATININE 0.82 01/22/2015 1559      Component Value Date/Time   CALCIUM 8.7 (L) 03/14/2018 1420   CALCIUM 9.3 01/08/2015 0850   ALKPHOS 72 03/14/2018 1420   ALKPHOS 157 (H) 01/22/2015 1559   AST 28 03/14/2018 1420   AST 34 01/22/2015 1559   ALT 17 03/14/2018 1420   ALT 42 01/22/2015 1559   BILITOT 0.9 03/14/2018 1420   BILITOT 6.2 (H) 03/04/2017 0918   BILITOT 0.2 (L) 01/22/2015 1559     ASSESSMENT & PLAN:  1. Brain metastasis ([place])   2. Metastatic breast cancer ([facility])   3. Palmar plantar erythrodysaesthesia    #Breast cancer:  Tolerating Xeloda except palmar plantar.  Counts reviewed, acceptable.  Proceed with this week's treatment.   Advised patient to continue take 1500 mg in the morning, decrease dose to 1000 mg in the evening. Continue applying Utter cream several times per day to keep skin moisturized.  Use protective gloves when performing activities.  # Palmar Plantar Erythrodysaesthesia: Grade 1.  Patient appears very concerned about the swelling and pain.  We will dose reduce evening dose to 1000 mg.  She voices understanding.  Follow-up in 2 weeks with Dr. B for assessment, check CBC, CMP, CA 27.29   All questions were answered. The patient knows to call the clinic with any problems, questions or concerns.  Total face to face encounter time for this patient visit was 9mn. >50% of the time was  spent in counseling and coordination of care.     ZEarlie Server MD 03/14/2018 10:16 PM

## 2018-03-14 NOTE — Progress Notes (Signed)
Patient here today for follow up. Concerned about increased discoloration to hands and increased numbness and tingling.

## 2018-03-15 LAB — CANCER ANTIGEN 27.29: CA 27.29: 12.6 U/mL (ref 0.0–38.6)

## 2018-03-16 ENCOUNTER — Ambulatory Visit: Payer: BLUE CROSS/BLUE SHIELD | Admitting: Orthotics

## 2018-03-16 DIAGNOSIS — Q828 Other specified congenital malformations of skin: Secondary | ICD-10-CM

## 2018-03-16 DIAGNOSIS — M79675 Pain in left toe(s): Secondary | ICD-10-CM

## 2018-03-16 DIAGNOSIS — B351 Tinea unguium: Secondary | ICD-10-CM

## 2018-03-16 DIAGNOSIS — M79674 Pain in right toe(s): Secondary | ICD-10-CM

## 2018-03-16 NOTE — Progress Notes (Signed)
Patient came in today to pick up custom made foot orthotics.  The goals were accomplished and the patient reported no dissatisfaction with said orthotics.  Patient was advised of breakin period and how to report any issues. 

## 2018-03-28 ENCOUNTER — Inpatient Hospital Stay: Payer: BLUE CROSS/BLUE SHIELD | Attending: Internal Medicine

## 2018-03-28 ENCOUNTER — Inpatient Hospital Stay (HOSPITAL_BASED_OUTPATIENT_CLINIC_OR_DEPARTMENT_OTHER): Payer: BLUE CROSS/BLUE SHIELD | Admitting: Internal Medicine

## 2018-03-28 VITALS — BP 108/77 | HR 108 | Temp 97.9°F | Resp 16 | Wt 169.2 lb

## 2018-03-28 DIAGNOSIS — Z170 Estrogen receptor positive status [ER+]: Secondary | ICD-10-CM

## 2018-03-28 DIAGNOSIS — Z79899 Other long term (current) drug therapy: Secondary | ICD-10-CM | POA: Insufficient documentation

## 2018-03-28 DIAGNOSIS — C50412 Malignant neoplasm of upper-outer quadrant of left female breast: Secondary | ICD-10-CM

## 2018-03-28 DIAGNOSIS — Z803 Family history of malignant neoplasm of breast: Secondary | ICD-10-CM | POA: Insufficient documentation

## 2018-03-28 DIAGNOSIS — I10 Essential (primary) hypertension: Secondary | ICD-10-CM | POA: Diagnosis not present

## 2018-03-28 DIAGNOSIS — G620 Drug-induced polyneuropathy: Secondary | ICD-10-CM | POA: Diagnosis not present

## 2018-03-28 DIAGNOSIS — I252 Old myocardial infarction: Secondary | ICD-10-CM | POA: Diagnosis not present

## 2018-03-28 DIAGNOSIS — T451X5S Adverse effect of antineoplastic and immunosuppressive drugs, sequela: Secondary | ICD-10-CM | POA: Insufficient documentation

## 2018-03-28 DIAGNOSIS — C7931 Secondary malignant neoplasm of brain: Secondary | ICD-10-CM

## 2018-03-28 DIAGNOSIS — F17210 Nicotine dependence, cigarettes, uncomplicated: Secondary | ICD-10-CM | POA: Diagnosis not present

## 2018-03-28 DIAGNOSIS — L818 Other specified disorders of pigmentation: Secondary | ICD-10-CM | POA: Insufficient documentation

## 2018-03-28 DIAGNOSIS — Z79818 Long term (current) use of other agents affecting estrogen receptors and estrogen levels: Secondary | ICD-10-CM | POA: Insufficient documentation

## 2018-03-28 DIAGNOSIS — M8580 Other specified disorders of bone density and structure, unspecified site: Secondary | ICD-10-CM | POA: Insufficient documentation

## 2018-03-28 DIAGNOSIS — R5381 Other malaise: Secondary | ICD-10-CM | POA: Diagnosis not present

## 2018-03-28 DIAGNOSIS — Z86711 Personal history of pulmonary embolism: Secondary | ICD-10-CM | POA: Diagnosis not present

## 2018-03-28 DIAGNOSIS — E785 Hyperlipidemia, unspecified: Secondary | ICD-10-CM | POA: Insufficient documentation

## 2018-03-28 DIAGNOSIS — R5383 Other fatigue: Secondary | ICD-10-CM | POA: Insufficient documentation

## 2018-03-28 DIAGNOSIS — L271 Localized skin eruption due to drugs and medicaments taken internally: Secondary | ICD-10-CM | POA: Diagnosis not present

## 2018-03-28 DIAGNOSIS — Z8719 Personal history of other diseases of the digestive system: Secondary | ICD-10-CM | POA: Insufficient documentation

## 2018-03-28 DIAGNOSIS — Z9221 Personal history of antineoplastic chemotherapy: Secondary | ICD-10-CM | POA: Diagnosis not present

## 2018-03-28 LAB — COMPREHENSIVE METABOLIC PANEL
ALBUMIN: 3.8 g/dL (ref 3.5–5.0)
ALK PHOS: 83 U/L (ref 38–126)
ALT: 19 U/L (ref 0–44)
ANION GAP: 11 (ref 5–15)
AST: 36 U/L (ref 15–41)
BUN: 11 mg/dL (ref 8–23)
CALCIUM: 9 mg/dL (ref 8.9–10.3)
CO2: 22 mmol/L (ref 22–32)
Chloride: 103 mmol/L (ref 98–111)
Creatinine, Ser: 0.89 mg/dL (ref 0.44–1.00)
GFR calc Af Amer: >60 mL/min (ref >60)
GFR calc non Af Amer: >60 mL/min (ref >60)
GLUCOSE: 114 mg/dL — AB (ref 70–99)
POTASSIUM: 3.8 mmol/L (ref 3.5–5.1)
SODIUM: 136 mmol/L (ref 135–145)
Total Bilirubin: 1.1 mg/dL (ref 0.3–1.2)
Total Protein: 7.3 g/dL (ref 6.5–8.1)

## 2018-03-28 LAB — CBC WITH DIFFERENTIAL/PLATELET
BASOS ABS: 0 10*3/uL (ref 0–0.1)
BASOS PCT: 1 %
Eosinophils Absolute: 0.1 10*3/uL (ref 0–0.7)
Eosinophils Relative: 2 %
HEMATOCRIT: 38.8 % (ref 35.0–47.0)
Hemoglobin: 13.2 g/dL (ref 12.0–16.0)
Lymphocytes Relative: 26 %
Lymphs Abs: 0.7 10*3/uL — ABNORMAL LOW (ref 1.0–3.6)
MCH: 34.9 pg — ABNORMAL HIGH (ref 26.0–34.0)
MCHC: 34 g/dL (ref 32.0–36.0)
MCV: 102.5 fL — ABNORMAL HIGH (ref 80.0–100.0)
Monocytes Absolute: 0.2 10*3/uL (ref 0.2–0.9)
Monocytes Relative: 9 %
NEUTROS ABS: 1.6 10*3/uL (ref 1.4–6.5)
Neutrophils Relative %: 62 %
Platelets: 140 10*3/uL — ABNORMAL LOW (ref 150–440)
RBC: 3.78 MIL/uL — ABNORMAL LOW (ref 3.80–5.20)
RDW: 16.6 % — ABNORMAL HIGH (ref 11.5–14.5)
WBC: 2.5 10*3/uL — ABNORMAL LOW (ref 3.6–11.0)

## 2018-03-28 MED ORDER — TRIAMCINOLONE ACETONIDE 0.5 % EX OINT: 1.0000 "application " | TOPICAL_OINTMENT | Freq: Two times a day (BID) | CUTANEOUS | 0 refills | Status: DC

## 2018-03-28 NOTE — Progress Notes (Signed)
[place] OFFICE PROGRESS NOTE  Patient Care Team: Steele Sizer, MD as PCP - General (Family Medicine) Bary Castilla, Forest Gleason, MD (General Surgery) [name], MD (Inactive) as Attending Physician (Internal Medicine)  Cancer Staging No matching staging information was found for the patient.   Oncology History   # LEFT BREAST IDC; STAGE II [cT2N1] ER >90%; PR- 50-90%; her 2 NEU- POS; s/p Neoadj chemo; AUG 2016- TCH+P s/p Lumpec & partial ALND- path CR; s/p RT [finished Nov 2016];  adj Herceptin; HELD for Jan 19th 2017 [in DEC 2016-EF dropped from 63 to 51%; FEB 27th EF-42.9%]; JAN 2016 START Arimidex; May 2017- EF- 60%; May 24th 2017-Re-start Herceptin q 3W; July 28th STOP herceptin [finished 11m/m2; sec to Low EF; however 2017 Oct EF= 67%; improved]  # DEC 4th 2017- Start Neratinib 4 pills; DEC 11th 3 pills; STOPPED.  # FEB 2018- RECURRENCE ER/PR positive; Her 2 NEGATIVE [liver Bx]  # MARCH 1st 2018- Tax-Cytoxan [poor tol]  # FEB 2018- Brain mets [SBRT; Dr.Crystal; finished Feb 28th 2018]  # March 2018- Eribulin- poor tolerance  # June 8th 2018- Started Faslodex + Abema [abema-multiple interruptions sec to neutropenia]  # MRI Brain- March 2019- Leptomeningeal mets [WBRT; No LP s/p WBRT- 01/09/2018]; STOP Abema [514mday-sec to severe cytopenia]+faslodex  # MAY 20tth 019- Start Xeloda    # PN- G-2; may 2017- Cymbalta 6055m;MARCH 2018-  acute vascular insuff of BIL LE [? Taxotere vasoconstriction on asprin]; # hemorragic shock LGIB [s/p colo; Dr.Wohl]  #  SVT- on flecainide.   # April 2016- Liver Bx- NEG;   # Drop in EF from Herceptin [recovered OCt 27th- EF 65%]  # BMD- jan 2017- osteopenia  -----------------------------------------------------------     [facility]- Sep 4th 2018- PDL-1 0%/NEG for BRCA; MSI-Stable; Positive for PI3K; ccnd-1; FGF**  -----------------------------------------------------------  Dx: Metastatic  Breast cancer [ER/PR-Pos; her -2 NEG] Stage IV; goals: palliative Current treatment- Xeloda [may 20th 2019]     Carcinoma of upper-outer quadrant of left breast in female, estrogen receptor positive [place]    INTERVAL HISTORY:  BreRUTHETTA Horne 39o.  female pleasant patient above history of metastatic breast cancer currently on palliative Xeloda is here for follow-up.  Patient is currently on Xeloda 2 pills in the morning 1 pill in the evening 1 week on and one-week off.  Patient continues to complain of significant hyperpigmentation of her bilateral hands/feet.  Also noted hyperpigmented lesions in the buccal mucosa.  Complains of pain especially with walking.  Patient has been using urea 20 and Vaseline.  She is currently on the All feet.  Review of Systems  Constitutional: Positive for malaise/fatigue. Negative for chills, diaphoresis, fever and weight loss.  HENT: Negative for nosebleeds and sore throat.   Eyes: Negative for double vision.  Respiratory: Negative for cough, hemoptysis, sputum production, shortness of breath and wheezing.   Cardiovascular: Negative for chest pain, palpitations, orthopnea and leg swelling.  Gastrointestinal: Negative for abdominal pain, blood in stool, constipation, diarrhea, heartburn, melena, nausea and vomiting.  Genitourinary: Negative for dysuria, frequency and urgency.  Musculoskeletal: Negative for back pain and joint pain.  Skin: Positive for rash. Negative for itching.  Neurological: Negative for dizziness, tingling, focal weakness, weakness and headaches.  Endo/Heme/Allergies: Does not bruise/bleed easily.  Psychiatric/Behavioral: Negative for depression. The patient is not nervous/anxious and does not have insomnia.       PAST MEDICAL HISTORY :  Past Medical History:  Diagnosis Date  .  Chemotherapy-induced peripheral neuropathy ([place])   . Epistaxis    a. 11/2016 in setting of asa/plavix-->silver nitrate cauterization.  . GI bleed     a. 11/2016 Admission w/ GIB and hypovolemic shock req 3u PRBC's;  b. 11/2016 ECG: gastritis & nonbleeding peptic ulcer; c. 11/2016 Conlonoscopy: rectal and sigmoid colonic ulcers.  . Heart attack ([place])    a. 1998 Cath @ [facility]: reportedly no intervention required.  Marland Kitchen Herceptin-induced cardiomyopathy ([place])    a. In the setting of Herceptin Rx for breast cancer (initiated 12/2014); b. 03/2015 MUGA EF 64%; b. 08/2015 MUGA: EF 51%; c. 10/2015 MUGA: EF 44%; d. 11/2015 Echo: EF 45-50%; e. 01/2016 MUGA: EF 60%; f. 06/2016 MUGA EF 65%; g. 10/2016 MUGA: EF 61%;  h. 12/2016 Echo: EF 55-60%, gr1 DD.  Marland Kitchen Hyperlipidemia   . Hypertension   . Neuropathy   . Possible PAD (peripheral artery disease) ([place])    a. 11/2016 LE cyanosis and weak pulses-->CTA w/o significant Ao-BiFem dzs. ? distal dzs-->ASA/Plavix initiated by vascular surgery.  Marland Kitchen PSVT (paroxysmal supraventricular tachycardia) ([place])    a. Dx 11/2016.  . Pulmonary embolism ([place])    a. 12/2016 CTA Chest: small nonocclusive PE in inferior segment of the Left lingula, somewhat eccentric filling defect suggesting chronic rather than acute embolic event; b. 05/5637 LE U/S:  No DVT; c. 12/2016 Echo: Nl RV fxn, nl PASP.  Marland Kitchen Recurrent Metastatic breast cancer ([place])    a. Dx 2016: Stage II, ER positive, PR positive, HER-2/neu overexpressing of the left breast-->chemo/radiation; b. 10/2016 CT Abd/pelvis: diffuse liver mets, ill defined sclerotic bone lesions-T12;  c. 10/2016 MRI brain: metastatic lesion along L temporal lobe (19x41m) w/ extensive surrounding edema & 571mmidline shift to right.  . Sinus tachycardia     PAST SURGICAL HISTORY :   Past Surgical History:  Procedure Laterality Date  . BREAST BIOPSY Left 2016   Positive  . BREAST LUMPECTOMY WITH SENTINEL LYMPH NODE BIOPSY Left 05/23/2015   Procedure: LEFT BREAST WIDE EXCISION WITH AXILLARY DISSECTION, MASTOPLASTY ;  Surgeon: JeRobert BellowMD;  Location: [facility] ORS;  Service: General;  Laterality: Left;  . BREAST  SURGERY Left 12/18/14   breast biopsy/INVASIVE DUCTAL CARCINOMA OF BREAST, NOTTINGHAM GRADE 2.  . Marland KitchenREAST SURGERY  05/23/2015.   Wide excision/mastoplasty, axillary dissection. No residual invasive cancer, positive for residual DCIS. 0/2 nodes identified on axillary dissection. (no SLN by technetium or methylene blue)  . CARDIAC CATHETERIZATION    . COLONOSCOPY WITH PROPOFOL N/A 12/10/2016   Procedure: COLONOSCOPY WITH PROPOFOL;  Surgeon: DaLucilla LameMD;  Location: [facility] ENDOSCOPY;  Service: Endoscopy;  Laterality: N/A;  . COLONOSCOPY WITH PROPOFOL N/A 02/13/2017   Procedure: COLONOSCOPY WITH PROPOFOL;  Surgeon: WoLucilla LameMD;  [facility];  Service: Endoscopy;  Laterality: N/A;  . ESOPHAGOGASTRODUODENOSCOPY (EGD) WITH PROPOFOL N/A 12/08/2016   Procedure: ESOPHAGOGASTRODUODENOSCOPY (EGD) WITH PROPOFOL;  Surgeon: DaLucilla LameMD;  Location: [facility] END[facility]COPY;  Service: Endoscopy;  Laterality: N/A;  . IVC FILTER INSERTION N/A 02/15/2017   Procedure: IVC Filter Insertion;  Surgeon: DeAlgernon HuxleyMD;  [facility];  Service: Cardiovascular;  Laterality: N/A;  . PORTACATH PLACEMENT Right 12-31-14   Dr ByBary Castilla  FAMILY HISTORY :   Family History  Problem Relation Age of Onset  . Breast cancer Maternal Aunt   . Breast cancer Cousin   . Brain cancer Maternal Uncle   . Diabetes Mother   . Hypertension Mother   . Stroke Mother  SOCIAL HISTORY:   Social History   Tobacco Use  . Smoking status: Light Tobacco Smoker    Packs/day: 0.50    Years: 18.00    Pack years: 9.00    Types: Cigarettes  . Smokeless tobacco: Never Used  Substance Use Topics  . Alcohol use: No    Alcohol/week: 0.0 oz  . Drug use: No    ALLERGIES:  is allergic to no known allergies.  MEDICATIONS:  Current Outpatient Medications  Medication Sig Dispense Refill  . capecitabine (XELODA) 500 MG tablet     . flecainide (TAMBOCOR) 50 MG tablet Take 1 tablet (50 mg total) by mouth 2 (two)  times daily. 60 tablet 11  . fluticasone (FLONASE) 50 MCG/ACT nasal spray Place 2 sprays into both nostrils daily. 16 g 0  . levocetirizine (XYZAL) 5 MG tablet Take 1 tablet (5 mg total) by mouth every evening. 30 tablet 3  . magnesium oxide (MAG-OX) 400 MG tablet Take 1 tablet (400 mg total) by mouth 2 (two) times daily. 60 tablet 6  . potassium chloride (KLOR-CON) 20 MEQ packet Take by mouth 2 (two) times daily.    . pregabalin (LYRICA) 75 MG capsule Take 1 capsule (75 mg total) by mouth 2 (two) times daily. 60 capsule 3  . Vitamin D, Ergocalciferol, (DRISDOL) 50000 units CAPS capsule TAKE 1 CAPSULE (50,000 UNITS TOTAL) ONCE A WEEK BY MOUTH. 12 capsule 1  . acetic acid-hydrocortisone (VOSOL-HC) OTIC solution Place 2 drops into both ears daily as needed. (Patient not taking: Reported on 03/14/2018) 10 mL 0  . baclofen (LIORESAL) 10 MG tablet Take 1 tablet (10 mg total) by mouth 3 (three) times daily. (Patient not taking: Reported on 03/14/2018) 30 tablet 0  . dexamethasone (DECADRON) 4 MG tablet Take 1 tablet (4 mg total) by mouth daily. (Patient not taking: Reported on 03/14/2018) 20 tablet 0  . oxyCODONE (OXY IR/ROXICODONE) 5 MG immediate release tablet Take 1 tablet (5 mg total) by mouth every 8 (eight) hours as needed for severe pain. (Patient not taking: Reported on 03/28/2018) 42 tablet 0  . traMADol (ULTRAM) 50 MG tablet Take 1 tablet (50 mg total) by mouth every 6 (six) hours as needed. (Patient not taking: Reported on 03/14/2018) 12 tablet 0  . triamcinolone ointment (KENALOG) 0.5 % Apply 1 application topically 2 (two) times daily. 30 g 0   No current facility-administered medications for this visit.    Facility-Administered Medications Ordered in Other Visits  Medication Dose Route Frequency Provider Last Rate Last Dose  . 0.9 %  sodium chloride infusion   Intravenous Continuous Pandit, Sandeep, MD      . 0.9 %  sodium chloride infusion   Intravenous Continuous Lloyd Huger, MD 999  mL/hr at 04/24/15 1520    . heparin lock flush 100 unit/mL  500 Units Intravenous Once Berenzon, Dmitriy, MD      . sodium chloride 0.9 % injection 10 mL  10 mL Intravenous PRN [name], MD   10 mL at 04/04/15 1440  . sodium chloride flush (NS) 0.9 % injection 10 mL  10 mL Intravenous PRN Berenzon, Dmitriy, MD      . Tbo-Filgrastim (GRANIX) injection 480 mcg  480 mcg Subcutaneous Once Cammie Sickle, MD        PHYSICAL EXAMINATION: ECOG PERFORMANCE STATUS: 1 - Symptomatic but completely ambulatory  BP 108/77 (BP Location: Right Arm, Patient Position: Sitting)   Pulse (!) 108   Temp 97.9 F (36.6 C) (Tympanic)  Resp 16   Wt 169 lb 3.2 oz (76.7 kg)   BMI 27.31 kg/m   Filed Weights   03/28/18 1021  Weight: 169 lb 3.2 oz (76.7 kg)    GENERAL: Well-nourished well-developed; Alert, no distress and comfortable.  Alone.  EYES: no pallor or icterus OROPHARYNX: no thrush or ulceration; hyperpigmented lesions noted in the oral mucosa. NECK: supple; no lymph nodes felt. LYMPH:  no palpable lymphadenopathy in the axillary or inguinal regions LUNGS: Decreased breath sounds auscultation bilaterally. No wheeze or crackles HEART/CVS: regular rate & rhythm and no murmurs; No lower extremity edema ABDOMEN:abdomen soft, non-tender and normal bowel sounds. No hepatomegaly or splenomegaly.  Musculoskeletal:no cyanosis of digits and no clubbing  PSYCH: alert & oriented x 3 with fluent speech NEURO: no focal motor/sensory deficits SKIN: Bilateral hands significant hyperpigmentation/bilateral feet hyperpigmentation.  Tenderness on deep palpation.    LABORATORY DATA:  I have reviewed the data as listed    Component Value Date/Time   NA 136 03/28/2018 1000   NA 135 03/04/2017 0918   NA 135 01/08/2015 0850   K 3.8 03/28/2018 1000   K 3.7 01/08/2015 0850   CL 103 03/28/2018 1000   CL 101 01/08/2015 0850   CO2 22 03/28/2018 1000   CO2 26 01/08/2015 0850   GLUCOSE 114 (H)  03/28/2018 1000   GLUCOSE 161 (H) 01/08/2015 0850   BUN 11 03/28/2018 1000   BUN 8 03/04/2017 0918   BUN 17 01/08/2015 0850   CREATININE 0.89 03/28/2018 1000   CREATININE 0.82 01/22/2015 1559   CALCIUM 9.0 03/28/2018 1000   CALCIUM 9.3 01/08/2015 0850   PROT 7.3 03/28/2018 1000   PROT 6.4 03/04/2017 0918   PROT 6.8 01/22/2015 1559   ALBUMIN 3.8 03/28/2018 1000   ALBUMIN 2.5 (L) 03/04/2017 0918   ALBUMIN 3.9 01/22/2015 1559   AST 36 03/28/2018 1000   AST 34 01/22/2015 1559   ALT 19 03/28/2018 1000   ALT 42 01/22/2015 1559   ALKPHOS 83 03/28/2018 1000   ALKPHOS 157 (H) 01/22/2015 1559   BILITOT 1.1 03/28/2018 1000   BILITOT 6.2 (H) 03/04/2017 0918   BILITOT 0.2 (L) 01/22/2015 1559   GFRNONAA >60 03/28/2018 1000   GFRNONAA >60 01/22/2015 1559   GFRAA >60 03/28/2018 1000   GFRAA >60 01/22/2015 1559    No results found for: SPEP, UPEP  Lab Results  Component Value Date   WBC 2.5 (L) 03/28/2018   NEUTROABS 1.6 03/28/2018   HGB 13.2 03/28/2018   HCT 38.8 03/28/2018   MCV 102.5 (H) 03/28/2018   PLT 140 (L) 03/28/2018      Chemistry      Component Value Date/Time   NA 136 03/28/2018 1000   NA 135 03/04/2017 0918   NA 135 01/08/2015 0850   K 3.8 03/28/2018 1000   K 3.7 01/08/2015 0850   CL 103 03/28/2018 1000   CL 101 01/08/2015 0850   CO2 22 03/28/2018 1000   CO2 26 01/08/2015 0850   BUN 11 03/28/2018 1000   BUN 8 03/04/2017 0918   BUN 17 01/08/2015 0850   CREATININE 0.89 03/28/2018 1000   CREATININE 0.82 01/22/2015 1559      Component Value Date/Time   CALCIUM 9.0 03/28/2018 1000   CALCIUM 9.3 01/08/2015 0850   ALKPHOS 83 03/28/2018 1000   ALKPHOS 157 (H) 01/22/2015 1559   AST 36 03/28/2018 1000   AST 34 01/22/2015 1559   ALT 19 03/28/2018 1000   ALT 42 01/22/2015 1559  BILITOT 1.1 03/28/2018 1000   BILITOT 6.2 (H) 03/04/2017 0918   BILITOT 0.2 (L) 01/22/2015 1559       RADIOGRAPHIC STUDIES: I have personally reviewed the radiological images as  listed and agreed with the findings in the report. No results found.   ASSESSMENT & PLAN:  Carcinoma of upper-outer quadrant of left breast in female, estrogen receptor positive ([place]) #Recurrent metastatic breast cancer-ER PR positive HER-2 negative; CT scan April 2019-increasing 10 mm liver nodule; leptomeningeal disease on MRI.  Clinically stable currently on Xeloda; but tolerating poorly-see discussion below  # hand foot syndrome- G-2; currently on Xeloda 3 pills- 2 pills- 1 week- On & 1 week- OFF. Add kenalog; urea 20%; vaseline bid.   # Brain metastases-recurrent/likely leptomeningeal disease;  S/p WBRT [April 2019].   clinically stable.  Check repeat MRI of the brain in 2 weeks.  #Peripheral neuropathy grade 1- stable on Lyrica  # follow up in .2 week/No labs; MRI brain prior.  We will plan to restart Xeloda at next visit at 2 pills twice a day.   Orders Placed This Encounter  Procedures  . MR Brain W Wo Contrast    Standing Status:   Future    Standing Expiration Date:   03/28/2019    Order Specific Question:   If indicated for the ordered procedure, I authorize the administration of contrast media per Radiology protocol    Answer:   Yes    Order Specific Question:   What is the patient's sedation requirement?    Answer:   No Sedation    Order Specific Question:   Does the patient have a pacemaker or implanted devices?    Answer:   No    Order Specific Question:   Use SRS Protocol?    Answer:   Yes    Order Specific Question:   Radiology Contrast Protocol - do NOT remove file path    Answer:   [email or web address]   All questions were answered. The patient knows to call the clinic with any problems, questions or concerns.      Cammie Sickle, MD 03/28/2018 2:06 PM

## 2018-03-28 NOTE — Assessment & Plan Note (Addendum)
#  Recurrent metastatic breast cancer-ER PR positive HER-2 negative; CT scan April 2019-increasing 10 mm liver nodule; leptomeningeal disease on MRI.  Clinically stable currently on Xeloda; but tolerating poorly-see discussion below  # hand foot syndrome- G-2; currently on Xeloda 3 pills- 2 pills- 1 week- On & 1 week- OFF. Add kenalog; urea 20%; vaseline bid.   # Brain metastases-recurrent/likely leptomeningeal disease;  S/p WBRT [April 2019].   clinically stable.  Check repeat MRI of the brain in 2 weeks.  #Peripheral neuropathy grade 1- stable on Lyrica  # follow up in .2 week/No labs; MRI brain prior.  We will plan to restart Xeloda at next visit at 2 pills twice a day.

## 2018-03-28 NOTE — Patient Instructions (Signed)
#  Stop taking Xeloda  Any questions for me

## 2018-03-29 LAB — CANCER ANTIGEN 27.29: CA 27.29: 15.3 U/mL (ref 0.0–38.6)

## 2018-04-04 ENCOUNTER — Other Ambulatory Visit: Payer: Self-pay | Admitting: Internal Medicine

## 2018-04-04 DIAGNOSIS — C50412 Malignant neoplasm of upper-outer quadrant of left female breast: Secondary | ICD-10-CM

## 2018-04-04 DIAGNOSIS — Z170 Estrogen receptor positive status [ER+]: Principal | ICD-10-CM

## 2018-04-08 ENCOUNTER — Ambulatory Visit
Admission: RE | Admit: 2018-04-08 | Discharge: 2018-04-08 | Disposition: A | Discharge status: Home / Self Care | Payer: BLUE CROSS/BLUE SHIELD | Source: Ambulatory Visit | Attending: Internal Medicine | Admitting: Internal Medicine

## 2018-04-08 DIAGNOSIS — C50919 Malignant neoplasm of unspecified site of unspecified female breast: Secondary | ICD-10-CM | POA: Diagnosis not present

## 2018-04-08 DIAGNOSIS — C7931 Secondary malignant neoplasm of brain: Secondary | ICD-10-CM | POA: Insufficient documentation

## 2018-04-08 DIAGNOSIS — Z170 Estrogen receptor positive status [ER+]: Secondary | ICD-10-CM | POA: Diagnosis not present

## 2018-04-08 DIAGNOSIS — C50412 Malignant neoplasm of upper-outer quadrant of left female breast: Secondary | ICD-10-CM | POA: Diagnosis not present

## 2018-04-08 DIAGNOSIS — C712 Malignant neoplasm of temporal lobe: Secondary | ICD-10-CM | POA: Diagnosis not present

## 2018-04-08 MED ORDER — GADOBENATE DIMEGLUMINE 529 MG/ML IV SOLN
15.0000 mL | Freq: Once | INTRAVENOUS | Status: AC | PRN
  Administered 2018-04-08: 15 mL via INTRAVENOUS

## 2018-04-11 ENCOUNTER — Other Ambulatory Visit: Payer: Self-pay

## 2018-04-11 ENCOUNTER — Inpatient Hospital Stay (HOSPITAL_BASED_OUTPATIENT_CLINIC_OR_DEPARTMENT_OTHER): Payer: BLUE CROSS/BLUE SHIELD | Admitting: Internal Medicine

## 2018-04-11 ENCOUNTER — Encounter: Payer: Self-pay | Admitting: Internal Medicine

## 2018-04-11 VITALS — BP 116/80 | HR 92 | Temp 98.1°F | Resp 18 | Ht 66.0 in | Wt 167.0 lb

## 2018-04-11 DIAGNOSIS — R5381 Other malaise: Secondary | ICD-10-CM | POA: Diagnosis not present

## 2018-04-11 DIAGNOSIS — M8580 Other specified disorders of bone density and structure, unspecified site: Secondary | ICD-10-CM

## 2018-04-11 DIAGNOSIS — T451X5S Adverse effect of antineoplastic and immunosuppressive drugs, sequela: Secondary | ICD-10-CM

## 2018-04-11 DIAGNOSIS — C50412 Malignant neoplasm of upper-outer quadrant of left female breast: Secondary | ICD-10-CM

## 2018-04-11 DIAGNOSIS — Z86711 Personal history of pulmonary embolism: Secondary | ICD-10-CM

## 2018-04-11 DIAGNOSIS — Z8719 Personal history of other diseases of the digestive system: Secondary | ICD-10-CM

## 2018-04-11 DIAGNOSIS — Z79818 Long term (current) use of other agents affecting estrogen receptors and estrogen levels: Secondary | ICD-10-CM

## 2018-04-11 DIAGNOSIS — F17210 Nicotine dependence, cigarettes, uncomplicated: Secondary | ICD-10-CM

## 2018-04-11 DIAGNOSIS — L818 Other specified disorders of pigmentation: Secondary | ICD-10-CM | POA: Diagnosis not present

## 2018-04-11 DIAGNOSIS — L271 Localized skin eruption due to drugs and medicaments taken internally: Secondary | ICD-10-CM | POA: Diagnosis not present

## 2018-04-11 DIAGNOSIS — C7931 Secondary malignant neoplasm of brain: Secondary | ICD-10-CM

## 2018-04-11 DIAGNOSIS — Z9221 Personal history of antineoplastic chemotherapy: Secondary | ICD-10-CM

## 2018-04-11 DIAGNOSIS — Z79899 Other long term (current) drug therapy: Secondary | ICD-10-CM

## 2018-04-11 DIAGNOSIS — G620 Drug-induced polyneuropathy: Secondary | ICD-10-CM | POA: Diagnosis not present

## 2018-04-11 DIAGNOSIS — I252 Old myocardial infarction: Secondary | ICD-10-CM | POA: Diagnosis not present

## 2018-04-11 DIAGNOSIS — Z803 Family history of malignant neoplasm of breast: Secondary | ICD-10-CM

## 2018-04-11 DIAGNOSIS — E785 Hyperlipidemia, unspecified: Secondary | ICD-10-CM

## 2018-04-11 DIAGNOSIS — R5383 Other fatigue: Secondary | ICD-10-CM | POA: Diagnosis not present

## 2018-04-11 DIAGNOSIS — I10 Essential (primary) hypertension: Secondary | ICD-10-CM | POA: Diagnosis not present

## 2018-04-11 DIAGNOSIS — Z170 Estrogen receptor positive status [ER+]: Secondary | ICD-10-CM

## 2018-04-11 NOTE — Progress Notes (Signed)
[place] OFFICE PROGRESS NOTE  Patient Care Team: Steele Sizer, MD as PCP - General (Family Medicine) Bary Castilla, Forest Gleason, MD (General Surgery) [name], MD (Inactive) as Attending Physician (Internal Medicine)  Cancer Staging No matching staging information was found for the patient.   Oncology History   # LEFT BREAST IDC; STAGE II [cT2N1] ER >90%; PR- 50-90%; her 2 NEU- POS; s/p Neoadj chemo; AUG 2016- TCH+P s/p Lumpec & partial ALND- path CR; s/p RT [finished Nov 2016];  adj Herceptin; HELD for Jan 19th 2017 [in DEC 2016-EF dropped from 63 to 51%; FEB 27th EF-42.9%]; JAN 2016 START Arimidex; May 2017- EF- 60%; May 24th 2017-Re-start Herceptin q 3W; July 28th STOP herceptin [finished 108m/m2; sec to Low EF; however 2017 Oct EF= 67%; improved]  # DEC 4th 2017- Start Neratinib 4 pills; DEC 11th 3 pills; STOPPED.  # FEB 2018- RECURRENCE ER/PR positive; Her 2 NEGATIVE [liver Bx]  # MARCH 1st 2018- Tax-Cytoxan [poor tol]  # FEB 2018- Brain mets [SBRT; Dr.Crystal; finished Feb 28th 2018]  # March 2018- Eribulin- poor tolerance  # June 8th 2018- Started Faslodex + Abema [abema-multiple interruptions sec to neutropenia]  # MRI Brain- March 2019- Leptomeningeal mets [WBRT; No LP s/p WBRT- 01/09/2018]; STOP Abema [552mday-sec to severe cytopenia]+faslodex  # MAY 20tth 019- Start Xeloda    # PN- G-2; may 2017- Cymbalta 6090m;MARCH 2018-  acute vascular insuff of BIL LE [? Taxotere vasoconstriction on asprin]; # hemorragic shock LGIB [s/p colo; Dr.Wohl]  #  SVT- on flecainide.   # April 2016- Liver Bx- NEG;   # Drop in EF from Herceptin [recovered OCt 27th- EF 65%]  # BMD- jan 2017- osteopenia  -----------------------------------------------------------     MOLWallins Creeke- Sep 4th 2018- PDL-1 0%/NEG for BRCA; MSI-Stable; Positive for PI3K; ccnd-1; FGF**  -----------------------------------------------------------  Dx: Metastatic  Breast cancer [ER/PR-Pos; her -2 NEG] Stage IV; goals: palliative Current treatment- Xeloda [may 20th 2019]     Carcinoma of upper-outer quadrant of left breast in female, estrogen receptor positive ([place]    INTERVAL HISTORY:  Sarah Horne 26o.  female pleasant patient above history of metastatic breast cancer to the brain and liver is here for follow-up/reviewed the results of MRI brain.  Patient most recently on Xeloda; currently on hold because of hand-foot syndrome.  Patient continues to have deep hyperpigmentation of her bilateral hands and feet.  However is improving.  She admits to resolution of the skin peeling.  And pain improved.  Review of Systems  Constitutional: Negative for chills, diaphoresis, fever, malaise/fatigue and weight loss.  HENT: Negative for nosebleeds and sore throat.   Eyes: Negative for double vision.  Respiratory: Negative for cough, hemoptysis, sputum production, shortness of breath and wheezing.   Cardiovascular: Negative for chest pain, palpitations, orthopnea and leg swelling.  Gastrointestinal: Negative for abdominal pain, blood in stool, constipation, diarrhea, heartburn, melena, nausea and vomiting.  Genitourinary: Negative for dysuria, frequency and urgency.  Musculoskeletal: Negative for back pain and joint pain.  Skin: Positive for rash (Rash on bilateral hands feet; ). Negative for itching.  Neurological: Negative for dizziness, tingling, focal weakness, weakness and headaches.  Endo/Heme/Allergies: Does not bruise/bleed easily.  Psychiatric/Behavioral: Negative for depression. The patient is not nervous/anxious and does not have insomnia.       PAST MEDICAL HISTORY :  Past Medical History:  Diagnosis Date  . Chemotherapy[place]induced peripheral [place] ([place] . Epistaxis    a. 11/2016  in setting of asa/plavix-->silver nitrate cauterization.  . GI bleed    a. 11/2016 Admission w/ GIB and hypovolemic shock req 3u PRBC's;  b. 11/2016  ECG: gastritis & nonbleeding peptic ulcer; c. 11/2016 Conlonoscopy: rectal and sigmoid colonic ulcers.  . Heart attack ([place])    a. 1998 Cath @ [facility]: reportedly no intervention required.  Marland Kitchen Herceptin-induced cardiomyopathy ([place])    a. In the setting of Herceptin Rx for breast cancer (initiated 12/2014); b. 03/2015 MUGA EF 64%; b. 08/2015 MUGA: EF 51%; c. 10/2015 MUGA: EF 44%; d. 11/2015 Echo: EF 45-50%; e. 01/2016 MUGA: EF 60%; f. 06/2016 MUGA EF 65%; g. 10/2016 MUGA: EF 61%;  h. 12/2016 Echo: EF 55-60%, gr1 DD.  Marland Kitchen Hyperlipidemia   . Hypertension   . Neuropathy   . Possible PAD (peripheral artery disease) ([place])    a. 11/2016 LE cyanosis and weak pulses-->CTA w/o significant Ao-BiFem dzs. ? distal dzs-->ASA/Plavix initiated by vascular surgery.  Marland Kitchen PSVT (paroxysmal supraventricular tachycardia) ([place])    a. Dx 11/2016.  . Pulmonary embolism ([place])    a. 12/2016 CTA Chest: small nonocclusive PE in inferior segment of the Left lingula, somewhat eccentric filling defect suggesting chronic rather than acute embolic event; b. 10/252 LE U/S:  No DVT; c. 12/2016 Echo: Nl RV fxn, nl PASP.  Marland Kitchen Recurrent Metastatic breast cancer ([place])    a. Dx 2016: Stage II, ER positive, PR positive, HER-2/neu overexpressing of the left breast-->chemo/radiation; b. 10/2016 CT Abd/pelvis: diffuse liver mets, ill defined sclerotic bone lesions-T12;  c. 10/2016 MRI brain: metastatic lesion along L temporal lobe (19x59m) w/ extensive surrounding edema & 585mmidline shift to right.  . Sinus tachycardia     PAST SURGICAL HISTORY :   Past Surgical History:  Procedure Laterality Date  . BREAST BIOPSY Left 2016   Positive  . BREAST LUMPECTOMY WITH SENTINEL LYMPH NODE BIOPSY Left 05/23/2015   Procedure: LEFT BREAST WIDE EXCISION WITH AXILLARY DISSECTION, MASTOPLASTY ;  Surgeon: JeRobert BellowMD;  Location: [facility] ORS;  Service: General;  Laterality: Left;  . BREAST SURGERY Left 12/18/14   breast biopsy/INVASIVE DUCTAL CARCINOMA OF BREAST,  [place] GRADE 2.  . Marland KitchenREAST SURGERY  05/23/2015.   Wide excision/mastoplasty, axillary dissection. No residual invasive cancer, positive for residual DCIS. 0/2 nodes identified on axillary dissection. (no SLN by technetium or methylene blue)  . CARDIAC CATHETERIZATION    . COLONOSCOPY WITH PROPOFOL N/A 12/10/2016   Procedure: COLONOSCOPY WITH PROPOFOL;  Surgeon: DaLucilla LameMD;  Location: [facility] ENDOSCOPY;  Service: Endoscopy;  Laterality: N/A;  . COLONOSCOPY WITH PROPOFOL N/A 02/13/2017   Procedure: COLONOSCOPY WITH PROPOFOL;  Surgeon: WoLucilla LameMD;  [facility];  Service: Endoscopy;  Laterality: N/A;  . ESOPHAGOGASTRODUODENOSCOPY (EGD) WITH PROPOFOL N/A 12/08/2016   Procedure: ESOPHAGOGASTRODUODENOSCOPY (EGD) WITH PROPOFOL;  Surgeon: DaLucilla LameMD;  Location: [facility] END[facility];  Service: Endoscopy;  Laterality: N/A;  . IVC FILTER INSERTION N/A 02/15/2017   Procedure: IVC Filter Insertion;  Surgeon: DeAlgernon HuxleyMD;  [facility];  Service: Cardiovascular;  Laterality: N/A;  . PORTACATH PLACEMENT Right 12-31-14   Dr ByBary Castilla  FAMILY HISTORY :   Family History  Problem Relation Age of Onset  . Breast cancer Maternal Aunt   . Breast cancer Cousin   . Brain cancer Maternal Uncle   . Diabetes Mother   . Hypertension Mother   . Stroke Mother     SOCIAL HISTORY:   Social History   Tobacco Use  .  Smoking status: Light Tobacco Smoker    Packs/day: 0.50    Years: 18.00    Pack years: 9.00    Types: Cigarettes  . Smokeless tobacco: Never Used  Substance Use Topics  . Alcohol use: No    Alcohol/week: 0.0 oz  . Drug use: No    ALLERGIES:  is allergic to no known allergies.  MEDICATIONS:  Current Outpatient Medications  Medication Sig Dispense Refill  . flecainide (TAMBOCOR) 50 MG tablet Take 1 tablet (50 mg total) by mouth 2 (two) times daily. 60 tablet 11  . fluticasone (FLONASE) 50 MCG/ACT nasal spray Place 2 sprays into both nostrils daily. 16 g  0  . KLOR-CON M20 20 MEQ tablet TAKE ONE TABLE BY MOUTH TWICE A DAY 60 tablet 3  . levocetirizine (XYZAL) 5 MG tablet Take 1 tablet (5 mg total) by mouth every evening. 30 tablet 3  . magnesium oxide (MAG-OX) 400 MG tablet Take 1 tablet (400 mg total) by mouth 2 (two) times daily. 60 tablet 6  . potassium chloride (KLOR-CON) 20 MEQ packet Take by mouth 2 (two) times daily.    . pregabalin (LYRICA) 75 MG capsule Take 1 capsule (75 mg total) by mouth 2 (two) times daily. 60 capsule 3  . triamcinolone ointment (KENALOG) 0.5 % Apply 1 application topically 2 (two) times daily. 30 g 0  . Vitamin D, Ergocalciferol, (DRISDOL) 50000 units CAPS capsule TAKE 1 CAPSULE (50,000 UNITS TOTAL) ONCE A WEEK BY MOUTH. 12 capsule 1  . acetic acid-hydrocortisone (VOSOL-HC) OTIC solution Place 2 drops into both ears daily as needed. (Patient not taking: Reported on 03/14/2018) 10 mL 0   No current facility-administered medications for this visit.    Facility-Administered Medications Ordered in Other Visits  Medication Dose Route Frequency Provider Last Rate Last Dose  . 0.9 %  sodium chloride infusion   Intravenous Continuous Pandit, Sandeep, MD      . 0.9 %  sodium chloride infusion   Intravenous Continuous Lloyd Huger, MD 999 mL/hr at 04/24/15 1520    . heparin lock flush 100 unit/mL  500 Units Intravenous Once Berenzon, Dmitriy, MD      . sodium chloride 0.9 % injection 10 mL  10 mL Intravenous PRN [name], MD   10 mL at 04/04/15 1440  . sodium chloride flush (NS) 0.9 % injection 10 mL  10 mL Intravenous PRN Berenzon, Dmitriy, MD      . Tbo-Filgrastim (GRANIX) injection 480 mcg  480 mcg Subcutaneous Once Cammie Sickle, MD        PHYSICAL EXAMINATION: ECOG PERFORMANCE STATUS: 1 - Symptomatic but completely ambulatory  BP 116/80   Pulse 92   Temp 98.1 F (36.7 C) (Tympanic)   Resp 18   Ht '5\' 6"'$  (1.676 m)   Wt 167 lb (75.8 kg)   BMI 26.95 kg/m   Filed Weights   04/11/18 1131   Weight: 167 lb (75.8 kg)    GENERAL: Well-nourished well-developed; Alert, no distress and comfortable.  Alone. EYES: no pallor or icterus OROPHARYNX: no thrush or ulceration; NECK: supple; no lymph nodes felt. LYMPH:  no palpable lymphadenopathy in the axillary or inguinal regions LUNGS: Decreased breath sounds auscultation bilaterally. No wheeze or crackles HEART/CVS: regular rate & rhythm and no murmurs; No lower extremity edema ABDOMEN:abdomen soft, non-tender and normal bowel sounds. No hepatomegaly or splenomegaly.  Musculoskeletal:no cyanosis of digits and no clubbing  PSYCH: alert & oriented x 3 with fluent speech NEURO: no focal motor/sensory  deficits SKIN:  no rashes or significant lesions    LABORATORY DATA:  I have reviewed the data as listed    Component Value Date/Time   NA 136 03/28/2018 1000   NA 135 03/04/2017 0918   NA 135 01/08/2015 0850   K 3.8 03/28/2018 1000   K 3.7 01/08/2015 0850   CL 103 03/28/2018 1000   CL 101 01/08/2015 0850   CO2 22 03/28/2018 1000   CO2 26 01/08/2015 0850   GLUCOSE 114 (H) 03/28/2018 1000   GLUCOSE 161 (H) 01/08/2015 0850   BUN 11 03/28/2018 1000   BUN 8 03/04/2017 0918   BUN 17 01/08/2015 0850   CREATININE 0.89 03/28/2018 1000   CREATININE 0.82 01/22/2015 1559   CALCIUM 9.0 03/28/2018 1000   CALCIUM 9.3 01/08/2015 0850   PROT 7.3 03/28/2018 1000   PROT 6.4 03/04/2017 0918   PROT 6.8 01/22/2015 1559   ALBUMIN 3.8 03/28/2018 1000   ALBUMIN 2.5 (L) 03/04/2017 0918   ALBUMIN 3.9 01/22/2015 1559   AST 36 03/28/2018 1000   AST 34 01/22/2015 1559   ALT 19 03/28/2018 1000   ALT 42 01/22/2015 1559   ALKPHOS 83 03/28/2018 1000   ALKPHOS 157 (H) 01/22/2015 1559   BILITOT 1.1 03/28/2018 1000   BILITOT 6.2 (H) 03/04/2017 0918   BILITOT 0.2 (L) 01/22/2015 1559   GFRNONAA >60 03/28/2018 1000   GFRNONAA >60 01/22/2015 1559   GFRAA >60 03/28/2018 1000   GFRAA >60 01/22/2015 1559    No results found for: SPEP, UPEP  Lab  Results  Component Value Date   WBC 2.5 (L) 03/28/2018   NEUTROABS 1.6 03/28/2018   HGB 13.2 03/28/2018   HCT 38.8 03/28/2018   MCV 102.5 (H) 03/28/2018   PLT 140 (L) 03/28/2018      Chemistry      Component Value Date/Time   NA 136 03/28/2018 1000   NA 135 03/04/2017 0918   NA 135 01/08/2015 0850   K 3.8 03/28/2018 1000   K 3.7 01/08/2015 0850   CL 103 03/28/2018 1000   CL 101 01/08/2015 0850   CO2 22 03/28/2018 1000   CO2 26 01/08/2015 0850   BUN 11 03/28/2018 1000   BUN 8 03/04/2017 0918   BUN 17 01/08/2015 0850   CREATININE 0.89 03/28/2018 1000   CREATININE 0.82 01/22/2015 1559      Component Value Date/Time   CALCIUM 9.0 03/28/2018 1000   CALCIUM 9.3 01/08/2015 0850   ALKPHOS 83 03/28/2018 1000   ALKPHOS 157 (H) 01/22/2015 1559   AST 36 03/28/2018 1000   AST 34 01/22/2015 1559   ALT 19 03/28/2018 1000   ALT 42 01/22/2015 1559   BILITOT 1.1 03/28/2018 1000   BILITOT 6.2 (H) 03/04/2017 0918   BILITOT 0.2 (L) 01/22/2015 1559       RADIOGRAPHIC STUDIES: I have personally reviewed the radiological images as listed and agreed with the findings in the report. No results found.   ASSESSMENT & PLAN:  Carcinoma of upper-outer quadrant of left breast in female, estrogen receptor positive ([place]) #Recurrent metastatic breast cancer-ER PR positive HER-2 negative; CT scan April 2019-increasing 10 mm liver nodule; leptomeningeal disease on MRI. Jully 12th MRI brain improving.  Xeloda on hold because of hand-foot syndrome [see discussion below]  #  Clinically stable; tumor markers improved.  Restart xeloda 2 pills in AM & 2 pills in PN; 1 w-On and 1 w OFF.   # hand foot syndrome- G-1/improved continue kenalog; urea 20%;  vaseline bid.   # Brain metastases-recurrent/likely leptomeningeal disease;  S/p WBRT [April 2019]. Improved on MRI. See above.   #Peripheral neuropathy grade 1- stable on Lyrica  # follow up in 3 week/labs; will order scans at next visit.   # I  reviewed the blood work- with the patient in detail; also reviewed the imaging independently [as summarized above]; and with the patient in detail.      Orders Placed This Encounter  Procedures  . CBC with Differential/Platelet    Standing Status:   Future    Standing Expiration Date:   04/12/2019  . Comprehensive metabolic panel    Standing Status:   Future    Standing Expiration Date:   04/12/2019  . Cancer antigen 27.29    Standing Status:   Future    Standing Expiration Date:   04/12/2019   All questions were answered. The patient knows to call the clinic with any problems, questions or concerns.      Cammie Sickle, MD 04/12/2018 7:26 PM

## 2018-04-11 NOTE — Assessment & Plan Note (Addendum)
#  Recurrent metastatic breast cancer-ER PR positive HER-2 negative; CT scan April 2019-increasing 10 mm liver nodule; leptomeningeal disease on MRI. Jully 12th MRI brain improving.  Xeloda on hold because of hand-foot syndrome [see discussion below]  #  Clinically stable; tumor markers improved.  Restart xeloda 2 pills in AM & 2 pills in PN; 1 w-On and 1 w OFF.   # hand foot syndrome- G-1/improved continue kenalog; urea 20%; vaseline bid.   # Brain metastases-recurrent/likely leptomeningeal disease;  S/p WBRT [April 2019]. Improved on MRI. See above.   #Peripheral neuropathy grade 1- stable on Lyrica  # follow up in 3 week/labs; will order scans at next visit.   # I reviewed the blood work- with the patient in detail; also reviewed the imaging independently [as summarized above]; and with the patient in detail.

## 2018-04-21 ENCOUNTER — Other Ambulatory Visit: Payer: Self-pay | Admitting: Physician Assistant

## 2018-04-22 ENCOUNTER — Other Ambulatory Visit: Payer: Self-pay | Admitting: Internal Medicine

## 2018-04-22 DIAGNOSIS — T451X5A Adverse effect of antineoplastic and immunosuppressive drugs, initial encounter: Secondary | ICD-10-CM

## 2018-04-22 DIAGNOSIS — G620 Drug-induced polyneuropathy: Secondary | ICD-10-CM

## 2018-04-22 DIAGNOSIS — C50412 Malignant neoplasm of upper-outer quadrant of left female breast: Secondary | ICD-10-CM

## 2018-04-22 DIAGNOSIS — Z170 Estrogen receptor positive status [ER+]: Principal | ICD-10-CM

## 2018-05-02 ENCOUNTER — Encounter: Payer: Self-pay | Admitting: Internal Medicine

## 2018-05-02 ENCOUNTER — Inpatient Hospital Stay: Payer: BLUE CROSS/BLUE SHIELD | Attending: Internal Medicine

## 2018-05-02 ENCOUNTER — Inpatient Hospital Stay (HOSPITAL_BASED_OUTPATIENT_CLINIC_OR_DEPARTMENT_OTHER): Payer: BLUE CROSS/BLUE SHIELD | Admitting: Internal Medicine

## 2018-05-02 VITALS — BP 107/79 | HR 117 | Temp 97.2°F | Resp 16

## 2018-05-02 DIAGNOSIS — E785 Hyperlipidemia, unspecified: Secondary | ICD-10-CM | POA: Diagnosis not present

## 2018-05-02 DIAGNOSIS — M8580 Other specified disorders of bone density and structure, unspecified site: Secondary | ICD-10-CM

## 2018-05-02 DIAGNOSIS — Z803 Family history of malignant neoplasm of breast: Secondary | ICD-10-CM | POA: Diagnosis not present

## 2018-05-02 DIAGNOSIS — I471 Supraventricular tachycardia: Secondary | ICD-10-CM

## 2018-05-02 DIAGNOSIS — Z9221 Personal history of antineoplastic chemotherapy: Secondary | ICD-10-CM | POA: Diagnosis not present

## 2018-05-02 DIAGNOSIS — Z79818 Long term (current) use of other agents affecting estrogen receptors and estrogen levels: Secondary | ICD-10-CM

## 2018-05-02 DIAGNOSIS — I252 Old myocardial infarction: Secondary | ICD-10-CM | POA: Diagnosis not present

## 2018-05-02 DIAGNOSIS — F17210 Nicotine dependence, cigarettes, uncomplicated: Secondary | ICD-10-CM

## 2018-05-02 DIAGNOSIS — L271 Localized skin eruption due to drugs and medicaments taken internally: Secondary | ICD-10-CM | POA: Insufficient documentation

## 2018-05-02 DIAGNOSIS — K0889 Other specified disorders of teeth and supporting structures: Secondary | ICD-10-CM

## 2018-05-02 DIAGNOSIS — C50412 Malignant neoplasm of upper-outer quadrant of left female breast: Secondary | ICD-10-CM

## 2018-05-02 DIAGNOSIS — T451X5S Adverse effect of antineoplastic and immunosuppressive drugs, sequela: Secondary | ICD-10-CM | POA: Diagnosis not present

## 2018-05-02 DIAGNOSIS — Z7901 Long term (current) use of anticoagulants: Secondary | ICD-10-CM | POA: Insufficient documentation

## 2018-05-02 DIAGNOSIS — R5381 Other malaise: Secondary | ICD-10-CM

## 2018-05-02 DIAGNOSIS — Z79899 Other long term (current) drug therapy: Secondary | ICD-10-CM

## 2018-05-02 DIAGNOSIS — C7931 Secondary malignant neoplasm of brain: Secondary | ICD-10-CM

## 2018-05-02 DIAGNOSIS — G620 Drug-induced polyneuropathy: Secondary | ICD-10-CM | POA: Diagnosis not present

## 2018-05-02 DIAGNOSIS — Z86711 Personal history of pulmonary embolism: Secondary | ICD-10-CM

## 2018-05-02 DIAGNOSIS — I10 Essential (primary) hypertension: Secondary | ICD-10-CM

## 2018-05-02 DIAGNOSIS — R5383 Other fatigue: Secondary | ICD-10-CM | POA: Diagnosis not present

## 2018-05-02 DIAGNOSIS — Z170 Estrogen receptor positive status [ER+]: Secondary | ICD-10-CM

## 2018-05-02 LAB — COMPREHENSIVE METABOLIC PANEL
ALK PHOS: 87 U/L (ref 38–126)
ALT: 21 U/L (ref 0–44)
ANION GAP: 8 (ref 5–15)
AST: 32 U/L (ref 15–41)
Albumin: 4 g/dL (ref 3.5–5.0)
BUN: 16 mg/dL (ref 8–23)
CO2: 24 mmol/L (ref 22–32)
CREATININE: 0.96 mg/dL (ref 0.44–1.00)
Calcium: 9.3 mg/dL (ref 8.9–10.3)
Chloride: 105 mmol/L (ref 98–111)
GFR calc Af Amer: >60 mL/min (ref >60)
GFR calc non Af Amer: >60 mL/min (ref >60)
Glucose, Bld: 101 mg/dL — ABNORMAL HIGH (ref 70–99)
Potassium: 4 mmol/L (ref 3.5–5.1)
Sodium: 137 mmol/L (ref 135–145)
Total Bilirubin: 1.1 mg/dL (ref 0.3–1.2)
Total Protein: 7.6 g/dL (ref 6.5–8.1)

## 2018-05-02 LAB — CBC WITH DIFFERENTIAL/PLATELET
Basophils Absolute: 0 10*3/uL (ref 0–0.1)
Basophils Relative: 1 %
Eosinophils Absolute: 0 10*3/uL (ref 0–0.7)
Eosinophils Relative: 1 %
HEMATOCRIT: 41.3 % (ref 35.0–47.0)
HEMOGLOBIN: 13.9 g/dL (ref 12.0–16.0)
LYMPHS ABS: 0.9 10*3/uL — AB (ref 1.0–3.6)
LYMPHS PCT: 24 %
MCH: 34.2 pg — AB (ref 26.0–34.0)
MCHC: 33.7 g/dL (ref 32.0–36.0)
MCV: 101.5 fL — ABNORMAL HIGH (ref 80.0–100.0)
MONOS PCT: 8 %
Monocytes Absolute: 0.3 10*3/uL (ref 0.2–0.9)
NEUTROS PCT: 66 %
Neutro Abs: 2.4 10*3/uL (ref 1.4–6.5)
Platelets: 170 10*3/uL (ref 150–440)
RBC: 4.07 MIL/uL (ref 3.80–5.20)
RDW: 15.1 % — ABNORMAL HIGH (ref 11.5–14.5)
WBC: 3.6 10*3/uL (ref 3.6–11.0)

## 2018-05-02 MED ORDER — TRIAMCINOLONE ACETONIDE 0.5 % EX OINT: 1.0000 "application " | TOPICAL_OINTMENT | Freq: Two times a day (BID) | CUTANEOUS | 2 refills | Status: DC

## 2018-05-02 NOTE — Assessment & Plan Note (Addendum)
#  Recurrent metastatic breast cancer-ER PR positive HER-2 negative; CT scan April 2019-increasing 10 mm liver nodule; leptomeningeal disease on MRI. Jully 12th MRI brain improving.   #  Clinically stable tumor markers improved. Continue xeloda 2 pills in AM & 2 pills in PN; 1 w-On and 1 w OFF.   # hand foot syndrome- G-1 continue kenalog; urea 20%; vaseline bid.  Stable  # Brain metastases-recurrent/likely leptomeningeal disease;  S/p WBRT [April 2019].  Clinically stable  #Peripheral neuropathy grade 1-stable on Lyrica  # dental issues- will check with Breast navigator's.  # follow up in 3 week/labs; CT scan prior.

## 2018-05-02 NOTE — Progress Notes (Signed)
[place] OFFICE PROGRESS NOTE  Patient Care Team: Steele Sizer, MD as PCP - General (Family Medicine) Bary Castilla, Forest Gleason, MD (General Surgery) [name], MD (Inactive) as Attending Physician (Internal Medicine)  Cancer Staging No matching staging information was found for the patient.   Oncology History   # LEFT BREAST IDC; STAGE II [cT2N1] ER >90%; PR- 50-90%; her 2 NEU- POS; s/p Neoadj chemo; AUG 2016- TCH+P s/p Lumpec & partial ALND- path CR; s/p RT [finished Nov 2016];  adj Herceptin; HELD for Jan 19th 2017 [in DEC 2016-EF dropped from 63 to 51%; FEB 27th EF-42.9%]; JAN 2016 START Arimidex; May 2017- EF- 60%; May 24th 2017-Re-start Herceptin q 3W; July 28th STOP herceptin [finished 70m/m2; sec to Low EF; however 2017 Oct EF= 67%; improved]  # DEC 4th 2017- Start Neratinib 4 pills; DEC 11th 3 pills; STOPPED.  # FEB 2018- RECURRENCE ER/PR positive; Her 2 NEGATIVE [liver Bx]  # MARCH 1st 2018- Tax-Cytoxan [poor tol]  # FEB 2018- Brain mets [SBRT; Dr.Crystal; finished Feb 28th 2018]  # March 2018- Eribulin- poor tolerance  # June 8th 2018- Started Faslodex + Abema [abema-multiple interruptions sec to neutropenia]  # MRI Brain- March 2019- Leptomeningeal mets [WBRT; No LP s/p WBRT- 01/09/2018]; STOP Abema [567mday-sec to severe cytopenia]+faslodex  # MAY 20tth 019- Start Xeloda    # PN- G-2; may 2017- Cymbalta 6050m;MARCH 2018-  acute vascular insuff of BIL LE [? Taxotere vasoconstriction on asprin]; # hemorragic shock LGIB [s/p colo; Dr.Wohl]  #  SVT- on flecainide.   # April 2016- Liver Bx- NEG;   # Drop in EF from Herceptin [recovered OCt 27th- EF 65%]  # BMD- jan 2017- osteopenia  -----------------------------------------------------------     MOL[facility]- Sep 4th 2018- PDL-1 0%/NEG for BRCA; MSI-Stable; Positive for PI3K; ccnd-1; FGF**  -----------------------------------------------------------  Dx: Metastatic  Breast cancer [ER/PR-Pos; her -2 NEG] Stage IV; goals: palliative Current treatment- Xeloda [may 20th 2019]     Carcinoma of upper-outer quadrant of left breast in female, [place] positive [place]    INTERVAL HISTORY:  [name].  female pleasant patient above history of metastatic ER PR positive breast cancer is here for follow-up.  Patient continues to complain of mild fatigue.  Denies any headaches.  Denies any nausea vomiting.  Complains of continued hyperpigmentation/rash of her palms and soles.  Mild tingling and numbness of her extremities.  Review of Systems  Constitutional: Positive for malaise/fatigue. Negative for chills, diaphoresis, fever and weight loss.  HENT: Negative for nosebleeds and sore throat.   Eyes: Negative for double vision.  Respiratory: Negative for cough, hemoptysis, sputum production, shortness of breath and wheezing.   Cardiovascular: Negative for chest pain, palpitations, orthopnea and leg swelling.  Gastrointestinal: Negative for abdominal pain, blood in stool, constipation, diarrhea, heartburn, melena, nausea and vomiting.  Genitourinary: Negative for dysuria, frequency and urgency.  Musculoskeletal: Negative for back pain and joint pain.  Skin: Positive for rash. Negative for itching.  Neurological: Positive for tingling. Negative for dizziness, focal weakness, weakness and headaches.  Endo/Heme/Allergies: Does not bruise/bleed easily.  Psychiatric/Behavioral: Negative for depression. The patient is not nervous/anxious and does not have insomnia.       PAST MEDICAL HISTORY :  Past Medical History:  Diagnosis Date  . Chemotherapy-induced peripheral neuropathy ([place] . Epistaxis    a. 11/2016 in setting of asa/plavix-->silver nitrate cauterization.  . GI bleed    a. 11/2016 Admission w/  GIB and hypovolemic shock req 3u PRBC's;  b. 11/2016 ECG: gastritis & nonbleeding peptic ulcer; c. 11/2016 Conlonoscopy: rectal and sigmoid  colonic ulcers.  . Heart attack ([place])    a. 1998 Cath @ [facility]: reportedly no intervention required.  Marland Kitchen Herceptin-induced cardiomyopathy ([place])    a. In the setting of Herceptin Rx for breast cancer (initiated 12/2014); b. 03/2015 MUGA EF 64%; b. 08/2015 MUGA: EF 51%; c. 10/2015 MUGA: EF 44%; d. 11/2015 Echo: EF 45-50%; e. 01/2016 MUGA: EF 60%; f. 06/2016 MUGA EF 65%; g. 10/2016 MUGA: EF 61%;  h. 12/2016 Echo: EF 55-60%, gr1 DD.  Marland Kitchen Hyperlipidemia   . Hypertension   . Neuropathy   . Possible PAD (peripheral artery disease) ([place])    a. 11/2016 LE cyanosis and weak pulses-->CTA w/o significant Ao-BiFem dzs. ? distal dzs-->ASA/Plavix initiated by vascular surgery.  Marland Kitchen PSVT (paroxysmal supraventricular tachycardia) ([place])    a. Dx 11/2016.  . Pulmonary embolism ([place])    a. 12/2016 CTA Chest: small nonocclusive PE in inferior segment of the Left lingula, somewhat eccentric filling defect suggesting chronic rather than acute embolic event; b. 10/6832 LE U/S:  No DVT; c. 12/2016 Echo: Nl RV fxn, nl PASP.  Marland Kitchen Recurrent Metastatic breast cancer ([place])    a. Dx 2016: Stage II, ER positive, PR positive, HER-2/neu overexpressing of the left breast-->chemo/radiation; b. 10/2016 CT Abd/pelvis: diffuse liver mets, ill defined sclerotic bone lesions-T12;  c. 10/2016 MRI brain: metastatic lesion along L temporal lobe (19x22m) w/ extensive surrounding edema & 576mmidline shift to right.  . Sinus tachycardia     PAST SURGICAL HISTORY :   Past Surgical History:  Procedure Laterality Date  . BREAST BIOPSY Left 2016   Positive  . BREAST LUMPECTOMY WITH SENTINEL LYMPH NODE BIOPSY Left 05/23/2015   Procedure: LEFT BREAST WIDE EXCISION WITH AXILLARY DISSECTION, MASTOPLASTY ;  Surgeon: JeRobert BellowMD;  Location: [facility] ORS;  Service: General;  Laterality: Left;  . BREAST SURGERY Left 12/18/14   breast biopsy/INVASIVE DUCTAL CARCINOMA OF BREAST, NOTTINGHAM GRADE 2.  . Marland KitchenREAST SURGERY  05/23/2015.   Wide excision/mastoplasty, axillary  dissection. No residual invasive cancer, positive for residual DCIS. 0/2 nodes identified on axillary dissection. (no SLN by technetium or methylene blue)  . CARDIAC CATHETERIZATION    . COLONOSCOPY WITH PROPOFOL N/A 12/10/2016   Procedure: COLONOSCOPY WITH PROPOFOL;  Surgeon: DaLucilla LameMD;  Location: [facility] ENDOSCOPY;  Service: Endoscopy;  Laterality: N/A;  . COLONOSCOPY WITH PROPOFOL N/A 02/13/2017   Procedure: COLONOSCOPY WITH PROPOFOL;  Surgeon: WoLucilla LameMD;  [facility];  Service: Endoscopy;  Laterality: N/A;  . ESOPHAGOGASTRODUODENOSCOPY (EGD) WITH PROPOFOL N/A 12/08/2016   Procedure: ESOPHAGOGASTRODUODENOSCOPY (EGD) WITH PROPOFOL;  Surgeon: DaLucilla LameMD;  Location: [facility] ENDOSCOPY;  Service: Endoscopy;  Laterality: N/A;  . IVC FILTER INSERTION N/A 02/15/2017   Procedure: IVC Filter Insertion;  Surgeon: DeAlgernon HuxleyMD;  [facility];  Service: Cardiovascular;  Laterality: N/A;  . PORTACATH PLACEMENT Right 12-31-14   Dr ByBary Castilla  FAMILY HISTORY :   Family History  Problem Relation Age of Onset  . Breast cancer Maternal Aunt   . Breast cancer Cousin   . Brain cancer Maternal Uncle   . Diabetes Mother   . Hypertension Mother   . Stroke Mother     SOCIAL HISTORY:   Social History   Tobacco Use  . Smoking status: Light Tobacco Smoker    Packs/day: 0.50    Years: 18.00  Pack years: 9.00    Types: Cigarettes  . Smokeless tobacco: Never Used  Substance Use Topics  . Alcohol use: No    Alcohol/week: 0.0 standard drinks  . Drug use: No    ALLERGIES:  is allergic to no known allergies.  MEDICATIONS:  Current Outpatient Medications  Medication Sig Dispense Refill  . flecainide (TAMBOCOR) 50 MG tablet Take 1 tablet (50 mg total) by mouth 2 (two) times daily. 60 tablet 11  . fluticasone (FLONASE) 50 MCG/ACT nasal spray Place 2 sprays into both nostrils daily. 16 g 0  . KLOR-CON M20 20 MEQ tablet TAKE ONE TABLE BY MOUTH TWICE A DAY 60  tablet 3  . levocetirizine (XYZAL) 5 MG tablet Take 1 tablet (5 mg total) by mouth every evening. 30 tablet 3  . LYRICA 75 MG capsule TAKE ONE CAPSULE BY MOUTH TWICE A DAY 60 capsule 4  . magnesium oxide (MAG-OX) 400 MG tablet TAKE 1 TABLET (400 MG TOTAL) BY MOUTH 2 (TWO) TIMES DAILY. 60 tablet 6  . potassium chloride (KLOR-CON) 20 MEQ packet Take by mouth 2 (two) times daily.    Marland Kitchen triamcinolone ointment (KENALOG) 0.5 % Apply 1 application topically 2 (two) times daily. 30 g 2  . Vitamin D, Ergocalciferol, (DRISDOL) 50000 units CAPS capsule TAKE 1 CAPSULE (50,000 UNITS TOTAL) ONCE A WEEK BY MOUTH. 12 capsule 1  . acetic acid-hydrocortisone (VOSOL-HC) OTIC solution Place 2 drops into both ears daily as needed. (Patient not taking: Reported on 03/14/2018) 10 mL 0  . capecitabine (XELODA) 500 MG tablet TAKE 4 TABLETS BY MOUTH TWICE DAILY AFTER A MEAL. TAKE FOR 1 WEEK ON THEN 1 WEEK OFF 112 tablet 0   No current facility-administered medications for this visit.    Facility-Administered Medications Ordered in Other Visits  Medication Dose Route Frequency Provider Last Rate Last Dose  . 0.9 %  sodium chloride infusion   Intravenous Continuous Pandit, Sandeep, MD      . 0.9 %  sodium chloride infusion   Intravenous Continuous Lloyd Huger, MD 999 mL/hr at 04/24/15 1520    . heparin lock flush 100 unit/mL  500 Units Intravenous Once Berenzon, Dmitriy, MD      . sodium chloride 0.9 % injection 10 mL  10 mL Intravenous PRN [name], MD   10 mL at 04/04/15 1440  . sodium chloride flush (NS) 0.9 % injection 10 mL  10 mL Intravenous PRN Berenzon, Dmitriy, MD      . Tbo-Filgrastim (GRANIX) injection 480 mcg  480 mcg Subcutaneous Once Cammie Sickle, MD        PHYSICAL EXAMINATION: ECOG PERFORMANCE STATUS: 1 - Symptomatic but completely ambulatory  BP 107/79 (BP Location: Left Arm, Patient Position: Sitting)   Pulse (!) 117   Temp (!) 97.2 F (36.2 C) (Tympanic)   Resp 16   There  were no vitals filed for this visit.  GENERAL: Well-nourished well-developed; Alert, no distress and comfortable.  Alone.  EYES: no pallor or icterus OROPHARYNX: no thrush or ulceration; NECK: supple; no lymph nodes felt. LYMPH:  no palpable lymphadenopathy in the axillary or inguinal regions LUNGS: Decreased breath sounds auscultation bilaterally. No wheeze or crackles HEART/CVS: regular rate & rhythm and no murmurs; No lower extremity edema ABDOMEN:abdomen soft, non-tender and normal bowel sounds. No hepatomegaly or splenomegaly.  Musculoskeletal:no cyanosis of digits and no clubbing  PSYCH: alert & oriented x 3 with fluent speech NEURO: no focal motor/sensory deficits SKIN:hyperpigmentation of her bilateral hands and feet.  LABORATORY DATA:  I have reviewed the data as listed    Component Value Date/Time   NA 137 05/02/2018 1420   NA 135 03/04/2017 0918   NA 135 01/08/2015 0850   K 4.0 05/02/2018 1420   K 3.7 01/08/2015 0850   CL 105 05/02/2018 1420   CL 101 01/08/2015 0850   CO2 24 05/02/2018 1420   CO2 26 01/08/2015 0850   GLUCOSE 101 (H) 05/02/2018 1420   GLUCOSE 161 (H) 01/08/2015 0850   BUN 16 05/02/2018 1420   BUN 8 03/04/2017 0918   BUN 17 01/08/2015 0850   CREATININE 0.96 05/02/2018 1420   CREATININE 0.82 01/22/2015 1559   CALCIUM 9.3 05/02/2018 1420   CALCIUM 9.3 01/08/2015 0850   PROT 7.6 05/02/2018 1420   PROT 6.4 03/04/2017 0918   PROT 6.8 01/22/2015 1559   ALBUMIN 4.0 05/02/2018 1420   ALBUMIN 2.5 (L) 03/04/2017 0918   ALBUMIN 3.9 01/22/2015 1559   AST 32 05/02/2018 1420   AST 34 01/22/2015 1559   ALT 21 05/02/2018 1420   ALT 42 01/22/2015 1559   ALKPHOS 87 05/02/2018 1420   ALKPHOS 157 (H) 01/22/2015 1559   BILITOT 1.1 05/02/2018 1420   BILITOT 6.2 (H) 03/04/2017 0918   BILITOT 0.2 (L) 01/22/2015 1559   GFRNONAA >60 05/02/2018 1420   GFRNONAA >60 01/22/2015 1559   GFRAA >60 05/02/2018 1420   GFRAA >60 01/22/2015 1559    No results found  for: SPEP, UPEP  Lab Results  Component Value Date   WBC 3.6 05/02/2018   NEUTROABS 2.4 05/02/2018   HGB 13.9 05/02/2018   HCT 41.3 05/02/2018   MCV 101.5 (H) 05/02/2018   PLT 170 05/02/2018      Chemistry      Component Value Date/Time   NA 137 05/02/2018 1420   NA 135 03/04/2017 0918   NA 135 01/08/2015 0850   K 4.0 05/02/2018 1420   K 3.7 01/08/2015 0850   CL 105 05/02/2018 1420   CL 101 01/08/2015 0850   CO2 24 05/02/2018 1420   CO2 26 01/08/2015 0850   BUN 16 05/02/2018 1420   BUN 8 03/04/2017 0918   BUN 17 01/08/2015 0850   CREATININE 0.96 05/02/2018 1420   CREATININE 0.82 01/22/2015 1559      Component Value Date/Time   CALCIUM 9.3 05/02/2018 1420   CALCIUM 9.3 01/08/2015 0850   ALKPHOS 87 05/02/2018 1420   ALKPHOS 157 (H) 01/22/2015 1559   AST 32 05/02/2018 1420   AST 34 01/22/2015 1559   ALT 21 05/02/2018 1420   ALT 42 01/22/2015 1559   BILITOT 1.1 05/02/2018 1420   BILITOT 6.2 (H) 03/04/2017 0918   BILITOT 0.2 (L) 01/22/2015 1559       RADIOGRAPHIC STUDIES: I have personally reviewed the radiological images as listed and agreed with the findings in the report. No results found.   ASSESSMENT & PLAN:  Carcinoma of upper-outer quadrant of left breast in female, estrogen receptor positive ([place]) #Recurrent metastatic breast cancer-ER PR positive HER-2 negative; CT scan April 2019-increasing 10 mm liver nodule; leptomeningeal disease on MRI. Jully 12th MRI brain improving.   #  Clinically stable tumor markers improved. Continue xeloda 2 pills in AM & 2 pills in PN; 1 w-On and 1 w OFF.   # hand foot syndrome- G-1 continue kenalog; urea 20%; vaseline bid.  Stable  # Brain metastases-recurrent/likely leptomeningeal disease;  S/p WBRT [April 2019].  Clinically stable  #Peripheral neuropathy grade 1-stable on  Lyrica  # dental issues- will check with Breast navigator's.  # follow up in 3 week/labs; CT scan prior.      Orders Placed This Encounter   Procedures  . CT Abdomen Pelvis W Contrast    Standing Status:   Future    Number of Occurrences:   1    Standing Expiration Date:   05/02/2019    Order Specific Question:   ** REASON FOR EXAM (FREE TEXT)    Answer:   breast cancer on chmeo    Order Specific Question:   If indicated for the ordered procedure, I authorize the administration of contrast media per Radiology protocol    Answer:   Yes    Order Specific Question:   Preferred imaging location?    Answer:   [facility]    Order Specific Question:   Is Oral Contrast requested for this exam?    Answer:   Yes, Per Radiology protocol    Order Specific Question:   Radiology Contrast Protocol - do NOT remove file path    Answer:   [email or web address]   All questions were answered. The patient knows to call the clinic with any problems, questions or concerns.      Cammie Sickle, MD 05/23/2018 9:17 PM

## 2018-05-03 LAB — CANCER ANTIGEN 27.29: CAN 27.29: 14.2 U/mL (ref 0.0–38.6)

## 2018-05-05 ENCOUNTER — Ambulatory Visit: Payer: BLUE CROSS/BLUE SHIELD | Admitting: Podiatry

## 2018-05-10 ENCOUNTER — Other Ambulatory Visit: Payer: Self-pay | Admitting: Internal Medicine

## 2018-05-10 DIAGNOSIS — C50412 Malignant neoplasm of upper-outer quadrant of left female breast: Secondary | ICD-10-CM

## 2018-05-10 DIAGNOSIS — Z170 Estrogen receptor positive status [ER+]: Principal | ICD-10-CM

## 2018-05-17 ENCOUNTER — Ambulatory Visit
Admission: RE | Admit: 2018-05-17 | Discharge: 2018-05-17 | Disposition: A | Discharge status: Home / Self Care | Payer: BLUE CROSS/BLUE SHIELD | Source: Ambulatory Visit | Attending: Internal Medicine | Admitting: Internal Medicine

## 2018-05-17 DIAGNOSIS — D259 Leiomyoma of uterus, unspecified: Secondary | ICD-10-CM | POA: Insufficient documentation

## 2018-05-17 DIAGNOSIS — Z170 Estrogen receptor positive status [ER+]: Secondary | ICD-10-CM | POA: Diagnosis not present

## 2018-05-17 DIAGNOSIS — C50412 Malignant neoplasm of upper-outer quadrant of left female breast: Secondary | ICD-10-CM | POA: Diagnosis not present

## 2018-05-17 DIAGNOSIS — I700 Atherosclerosis of aorta: Secondary | ICD-10-CM | POA: Insufficient documentation

## 2018-05-17 DIAGNOSIS — C50912 Malignant neoplasm of unspecified site of left female breast: Secondary | ICD-10-CM | POA: Diagnosis not present

## 2018-05-17 DIAGNOSIS — C787 Secondary malignant neoplasm of liver and intrahepatic bile duct: Secondary | ICD-10-CM | POA: Insufficient documentation

## 2018-05-17 MED ORDER — IOPAMIDOL (ISOVUE-300) INJECTION 61%
100.0000 mL | Freq: Once | INTRAVENOUS | Status: AC | PRN
  Administered 2018-05-17: 100 mL via INTRAVENOUS

## 2018-05-25 ENCOUNTER — Inpatient Hospital Stay: Payer: BLUE CROSS/BLUE SHIELD

## 2018-05-25 ENCOUNTER — Inpatient Hospital Stay (HOSPITAL_BASED_OUTPATIENT_CLINIC_OR_DEPARTMENT_OTHER): Payer: BLUE CROSS/BLUE SHIELD | Admitting: Internal Medicine

## 2018-05-25 VITALS — BP 114/77 | HR 77 | Temp 97.0°F | Resp 16

## 2018-05-25 DIAGNOSIS — Z9221 Personal history of antineoplastic chemotherapy: Secondary | ICD-10-CM | POA: Diagnosis not present

## 2018-05-25 DIAGNOSIS — R5383 Other fatigue: Secondary | ICD-10-CM | POA: Diagnosis not present

## 2018-05-25 DIAGNOSIS — R5381 Other malaise: Secondary | ICD-10-CM

## 2018-05-25 DIAGNOSIS — T451X5S Adverse effect of antineoplastic and immunosuppressive drugs, sequela: Secondary | ICD-10-CM

## 2018-05-25 DIAGNOSIS — Z79899 Other long term (current) drug therapy: Secondary | ICD-10-CM

## 2018-05-25 DIAGNOSIS — M8580 Other specified disorders of bone density and structure, unspecified site: Secondary | ICD-10-CM | POA: Diagnosis not present

## 2018-05-25 DIAGNOSIS — L271 Localized skin eruption due to drugs and medicaments taken internally: Secondary | ICD-10-CM | POA: Diagnosis not present

## 2018-05-25 DIAGNOSIS — C7931 Secondary malignant neoplasm of brain: Secondary | ICD-10-CM

## 2018-05-25 DIAGNOSIS — I252 Old myocardial infarction: Secondary | ICD-10-CM | POA: Diagnosis not present

## 2018-05-25 DIAGNOSIS — I10 Essential (primary) hypertension: Secondary | ICD-10-CM | POA: Diagnosis not present

## 2018-05-25 DIAGNOSIS — F17210 Nicotine dependence, cigarettes, uncomplicated: Secondary | ICD-10-CM

## 2018-05-25 DIAGNOSIS — E785 Hyperlipidemia, unspecified: Secondary | ICD-10-CM | POA: Diagnosis not present

## 2018-05-25 DIAGNOSIS — Z79818 Long term (current) use of other agents affecting estrogen receptors and estrogen levels: Secondary | ICD-10-CM

## 2018-05-25 DIAGNOSIS — Z7901 Long term (current) use of anticoagulants: Secondary | ICD-10-CM

## 2018-05-25 DIAGNOSIS — C50412 Malignant neoplasm of upper-outer quadrant of left female breast: Secondary | ICD-10-CM | POA: Diagnosis not present

## 2018-05-25 DIAGNOSIS — Z170 Estrogen receptor positive status [ER+]: Principal | ICD-10-CM

## 2018-05-25 DIAGNOSIS — K0889 Other specified disorders of teeth and supporting structures: Secondary | ICD-10-CM | POA: Diagnosis not present

## 2018-05-25 DIAGNOSIS — I471 Supraventricular tachycardia: Secondary | ICD-10-CM | POA: Diagnosis not present

## 2018-05-25 DIAGNOSIS — G620 Drug-induced polyneuropathy: Secondary | ICD-10-CM | POA: Diagnosis not present

## 2018-05-25 DIAGNOSIS — Z86711 Personal history of pulmonary embolism: Secondary | ICD-10-CM

## 2018-05-25 DIAGNOSIS — Z803 Family history of malignant neoplasm of breast: Secondary | ICD-10-CM

## 2018-05-25 LAB — COMPREHENSIVE METABOLIC PANEL
ALBUMIN: 4 g/dL (ref 3.5–5.0)
ALT: 19 U/L (ref 0–44)
AST: 30 U/L (ref 15–41)
Alkaline Phosphatase: 93 U/L (ref 38–126)
Anion gap: 7 (ref 5–15)
BILIRUBIN TOTAL: 1.3 mg/dL — AB (ref 0.3–1.2)
BUN: 14 mg/dL (ref 8–23)
CHLORIDE: 106 mmol/L (ref 98–111)
CO2: 25 mmol/L (ref 22–32)
Calcium: 9 mg/dL (ref 8.9–10.3)
Creatinine, Ser: 0.89 mg/dL (ref 0.44–1.00)
GFR calc Af Amer: >60 mL/min (ref >60)
GLUCOSE: 105 mg/dL — AB (ref 70–99)
POTASSIUM: 4.2 mmol/L (ref 3.5–5.1)
Sodium: 138 mmol/L (ref 135–145)
TOTAL PROTEIN: 7.3 g/dL (ref 6.5–8.1)

## 2018-05-25 LAB — CBC WITH DIFFERENTIAL/PLATELET
BASOS ABS: 0 10*3/uL (ref 0–0.1)
Basophils Relative: 1 %
EOS PCT: 2 %
Eosinophils Absolute: 0 10*3/uL (ref 0–0.7)
HEMATOCRIT: 41.1 % (ref 35.0–47.0)
Hemoglobin: 14 g/dL (ref 12.0–16.0)
LYMPHS ABS: 0.8 10*3/uL — AB (ref 1.0–3.6)
LYMPHS PCT: 34 %
MCH: 34.1 pg — AB (ref 26.0–34.0)
MCHC: 34 g/dL (ref 32.0–36.0)
MCV: 100.2 fL — AB (ref 80.0–100.0)
Monocytes Absolute: 0.3 10*3/uL (ref 0.2–0.9)
Monocytes Relative: 11 %
NEUTROS ABS: 1.3 10*3/uL — AB (ref 1.4–6.5)
Neutrophils Relative %: 52 %
Platelets: 147 10*3/uL — ABNORMAL LOW (ref 150–440)
RBC: 4.1 MIL/uL (ref 3.80–5.20)
RDW: 16 % — ABNORMAL HIGH (ref 11.5–14.5)
WBC: 2.4 10*3/uL — AB (ref 3.6–11.0)

## 2018-05-25 NOTE — Progress Notes (Signed)
[place] OFFICE PROGRESS NOTE  Patient Care Team: Steele Sizer, MD as PCP - General (Family Medicine) Bary Castilla, Forest Gleason, MD (General Surgery) [name], MD (Inactive) as Attending Physician (Internal Medicine)  Cancer Staging No matching staging information was found for the patient.   Oncology History   # LEFT BREAST IDC; STAGE II [cT2N1] ER >90%; PR- 50-90%; her 2 NEU- POS; s/p Neoadj chemo; AUG 2016- TCH+P s/p Lumpec & partial ALND- path CR; s/p RT [finished Nov 2016];  adj Herceptin; HELD for Jan 19th 2017 [in DEC 2016-EF dropped from 63 to 51%; FEB 27th EF-42.9%]; JAN 2016 START Arimidex; May 2017- EF- 60%; May 24th 2017-Re-start Herceptin q 3W; July 28th STOP herceptin [finished '86mg'$ /m2; sec to Low EF; however 2017 Oct EF= 67%; improved]  # DEC 4th 2017- Start Neratinib 4 pills; DEC 11th 3 pills; STOPPED.  # FEB 2018- RECURRENCE ER/PR positive; Her 2 NEGATIVE [liver Bx]  # MARCH 1st 2018- Tax-Cytoxan [poor tol]  # FEB 2018- Brain mets [SBRT; Dr.Crystal; finished Feb 28th 2018]  # March 2018- Eribulin- poor tolerance  # June 8th 2018- Started Faslodex + Abema [abema-multiple interruptions sec to neutropenia]  # MRI Brain- March 2019- Leptomeningeal mets [WBRT; No LP s/p WBRT- 01/09/2018]; STOP Abema ['50mg'$ /day-sec to severe cytopenia]+faslodex  # MAY 20tth 019- Start Xeloda    # PN- G-2; may 2017- Cymbalta '60mg'$ /d;MARCH 2018-  acute vascular insuff of BIL LE [? Taxotere vasoconstriction on asprin]; # hemorragic shock LGIB [s/p colo; Dr.Wohl]  #  SVT- on flecainide.   # April 2016- Liver Bx- NEG;   # Drop in EF from Herceptin [recovered OCt 27th- EF 65%]  # BMD- jan 2017- osteopenia  -----------------------------------------------------------     Sarah Horne- Sep 4th 2018- PDL-1 0%/NEG for BRCA; MSI-Stable; Positive for PI3K; ccnd-1; FGF**  -----------------------------------------------------------  Dx: Metastatic  Breast cancer [ER/PR-Pos; her -2 NEG] Stage IV; goals: palliative Current treatment- Xeloda [may 20th 2019]     Carcinoma of upper-outer quadrant of left breast in female, estrogen receptor positive ([place])      INTERVAL HISTORY:  Sarah Horne 62 y.o.  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  Continues to have darkish pigmentation of her hand and feet.  No significant pain.  Review of Systems  Constitutional: Negative for chills, diaphoresis, fever, malaise/fatigue and weight loss.  HENT: Negative for nosebleeds and sore throat.   Eyes: Negative for double vision.  Respiratory: Negative for cough, hemoptysis, sputum production, shortness of breath and wheezing.   Cardiovascular: Negative for chest pain, palpitations, orthopnea and leg swelling.  Gastrointestinal: Negative for abdominal pain, blood in stool, constipation, diarrhea, heartburn, melena, nausea and vomiting.  Genitourinary: Negative for dysuria, frequency and urgency.  Musculoskeletal: Negative for back pain and joint pain.  Skin: Negative.  Negative for itching and rash.  Neurological: Negative for dizziness, tingling, focal weakness, weakness and headaches.  Endo/Heme/Allergies: Does not bruise/bleed easily.  Psychiatric/Behavioral: Negative for depression. The patient is not nervous/anxious and does not have insomnia.       PAST MEDICAL HISTORY :  Past Medical History:  Diagnosis Date  . Chemotherapy-induced peripheral neuropathy ([place])   . Epistaxis    a. 11/2016 in setting of asa/plavix-->silver nitrate cauterization.  . GI bleed  a. 11/2016 Admission w/ GIB and hypovolemic shock req 3u PRBC's;  b. 11/2016 ECG: gastritis & nonbleeding peptic ulcer; c. 11/2016  Conlonoscopy: rectal and sigmoid colonic ulcers.  . Heart attack ([place])    a. 1998 Cath @ [facility]: reportedly no intervention required.  Marland Kitchen Herceptin-induced cardiomyopathy ([place])    a. In the setting of Herceptin Rx for breast cancer (initiated 12/2014); b. 03/2015 MUGA EF 64%; b. 08/2015 MUGA: EF 51%; c. 10/2015 MUGA: EF 44%; d. 11/2015 Echo: EF 45-50%; e. 01/2016 MUGA: EF 60%; f. 06/2016 MUGA EF 65%; g. 10/2016 MUGA: EF 61%;  h. 12/2016 Echo: EF 55-60%, gr1 DD.  Marland Kitchen Hyperlipidemia   . Hypertension   . Neuropathy   . Possible PAD (peripheral artery disease) ([place])    a. 11/2016 LE cyanosis and weak pulses-->CTA w/o significant Ao-BiFem dzs. ? distal dzs-->ASA/Plavix initiated by vascular surgery.  Marland Kitchen PSVT (paroxysmal supraventricular tachycardia) ([place])    a. Dx 11/2016.  . Pulmonary embolism ([place])    a. 12/2016 CTA Chest: small nonocclusive PE in inferior segment of the Left lingula, somewhat eccentric filling defect suggesting chronic rather than acute embolic event; b. 02/2702 LE U/S:  No DVT; c. 12/2016 Echo: Nl RV fxn, nl PASP.  Marland Kitchen Recurrent Metastatic breast cancer ([place])    a. Dx 2016: Stage II, ER positive, PR positive, HER-2/neu overexpressing of the left breast-->chemo/radiation; b. 10/2016 CT Abd/pelvis: diffuse liver mets, ill defined sclerotic bone lesions-T12;  c. 10/2016 MRI brain: metastatic lesion along L temporal lobe (19x65m) w/ extensive surrounding edema & 541mmidline shift to right.  . Sinus tachycardia     PAST SURGICAL HISTORY :   Past Surgical History:  Procedure Laterality Date  . BREAST BIOPSY Left 2016   Positive  . BREAST LUMPECTOMY WITH SENTINEL LYMPH NODE BIOPSY Left 05/23/2015   Procedure: LEFT BREAST WIDE EXCISION WITH AXILLARY DISSECTION, MASTOPLASTY ;  Surgeon: JeRobert BellowMD;  Location: [facility] ORS;  Service: General;  Laterality: Left;  . BREAST SURGERY Left 12/18/14   breast biopsy/INVASIVE DUCTAL CARCINOMA OF BREAST, NOTTINGHAM GRADE 2.  . Marland KitchenREAST SURGERY  05/23/2015.    Wide excision/mastoplasty, axillary dissection. No residual invasive cancer, positive for residual DCIS. 0/2 nodes identified on axillary dissection. (no SLN by technetium or methylene blue)  . CARDIAC CATHETERIZATION    . COLONOSCOPY WITH PROPOFOL N/A 12/10/2016   Procedure: COLONOSCOPY WITH PROPOFOL;  Surgeon: DaLucilla LameMD;  Location: [facility] ENDOSCOPY;  Service: Endoscopy;  Laterality: N/A;  . COLONOSCOPY WITH PROPOFOL N/A 02/13/2017   Procedure: COLONOSCOPY WITH PROPOFOL;  Surgeon: WoLucilla LameMD;  [facility];  Service: Endoscopy;  Laterality: N/A;  . ESOPHAGOGASTRODUODENOSCOPY (EGD) WITH PROPOFOL N/A 12/08/2016   Procedure: ESOPHAGOGASTRODUODENOSCOPY (EGD) WITH PROPOFOL;  Surgeon: DaLucilla LameMD;  Location: [facility] END[facility]COPY;  Service: Endoscopy;  Laterality: N/A;  . IVC FILTER INSERTION N/A 02/15/2017   Procedure: IVC Filter Insertion;  Surgeon: DeAlgernon HuxleyMD;  [facility];  Service: Cardiovascular;  Laterality: N/A;  . PORTACATH PLACEMENT Right 12-31-14   Dr ByBary Castilla  FAMILY HISTORY :   Family History  Problem Relation Age of Onset  . Breast cancer Maternal Aunt   . Breast cancer Cousin   . Brain cancer Maternal Uncle   . Diabetes Mother   . Hypertension Mother   . Stroke Mother     SOCIAL HISTORY:   Social History   Tobacco Use  . Smoking status: Light Tobacco Smoker    Packs/day: 0.50  Years: 18.00    Pack years: 9.00    Types: Cigarettes  . Smokeless tobacco: Never Used  Substance Use Topics  . Alcohol use: No    Alcohol/week: 0.0 standard drinks  . Drug use: No    ALLERGIES:  is allergic to no known allergies.  MEDICATIONS:  Current Outpatient Medications  Medication Sig Dispense Refill  . capecitabine (XELODA) 500 MG tablet TAKE 4 TABLETS BY MOUTH TWICE DAILY AFTER A MEAL. TAKE FOR 1 WEEK ON THEN 1 WEEK OFF 112 tablet 0  . flecainide (TAMBOCOR) 50 MG tablet Take 1 tablet (50 mg total) by mouth 2 (two) times daily. 60 tablet  11  . fluticasone (FLONASE) 50 MCG/ACT nasal spray Place 2 sprays into both nostrils daily. 16 g 0  . KLOR-CON M20 20 MEQ tablet TAKE Horne TABLE BY MOUTH TWICE A DAY 60 tablet 3  . levocetirizine (XYZAL) 5 MG tablet Take 1 tablet (5 mg total) by mouth every evening. 30 tablet 3  . LYRICA 75 MG capsule TAKE Horne CAPSULE BY MOUTH TWICE A DAY 60 capsule 4  . magnesium oxide (MAG-OX) 400 MG tablet TAKE 1 TABLET (400 MG TOTAL) BY MOUTH 2 (TWO) TIMES DAILY. 60 tablet 6  . potassium chloride (KLOR-CON) 20 MEQ packet Take by mouth 2 (two) times daily.    Marland Kitchen triamcinolone ointment (KENALOG) 0.5 % Apply 1 application topically 2 (two) times daily. 30 g 2  . Vitamin D, Ergocalciferol, (DRISDOL) 50000 units CAPS capsule TAKE 1 CAPSULE (50,000 UNITS TOTAL) ONCE A WEEK BY MOUTH. 12 capsule 1  . acetic acid-hydrocortisone (VOSOL-HC) OTIC solution Place 2 drops into both ears daily as needed. (Patient not taking: Reported on 05/25/2018) 10 mL 0   No current facility-administered medications for this visit.    Facility-Administered Medications Ordered in Other Visits  Medication Dose Route Frequency Provider Last Rate Last Dose  . 0.9 %  sodium chloride infusion   Intravenous Continuous Pandit, Sandeep, MD      . 0.9 %  sodium chloride infusion   Intravenous Continuous Lloyd Huger, MD 999 mL/hr at 04/24/15 1520    . heparin lock flush 100 unit/mL  500 Units Intravenous Once Berenzon, Dmitriy, MD      . sodium chloride 0.9 % injection 10 mL  10 mL Intravenous PRN [name], MD   10 mL at 04/04/15 1440  . sodium chloride flush (NS) 0.9 % injection 10 mL  10 mL Intravenous PRN Berenzon, Dmitriy, MD      . Tbo-Filgrastim (GRANIX) injection 480 mcg  480 mcg Subcutaneous Once Cammie Sickle, MD        PHYSICAL EXAMINATION: ECOG PERFORMANCE STATUS: 1 - Symptomatic but completely ambulatory  BP 114/77 (BP Location: Left Arm, Patient Position: Sitting)   Pulse 77   Temp (!) 97 F (36.1 C)  (Tympanic)   Resp 16   There were no vitals filed for this visit.  Physical Exam  Constitutional: She is oriented to person, place, and time and well-developed, well-nourished, and in no distress.  Alone.  Walking herself.  HENT:  Head: Normocephalic and atraumatic.  Mouth/Throat: Oropharynx is clear and moist. No oropharyngeal exudate.  Eyes: Pupils are equal, round, and reactive to light.  Neck: Normal range of motion. Neck supple.  Cardiovascular: Normal rate and regular rhythm.  Pulmonary/Chest: No respiratory distress. She has no wheezes.  Abdominal: Soft. Bowel sounds are normal. She exhibits no distension and no mass. There is no tenderness. There is no rebound  and no guarding.  Musculoskeletal: Normal range of motion. She exhibits no edema or tenderness.  Neurological: She is alert and oriented to person, place, and time.  Skin: Skin is warm.  Significant hyperpigmentation of the bilateral hand and feet.  No desquamation.  Psychiatric: Affect normal.       LABORATORY DATA:  I have reviewed the data as listed    Component Value Date/Time   NA 138 05/25/2018 1355   NA 135 03/04/2017 0918   NA 135 01/08/2015 0850   K 4.2 05/25/2018 1355   K 3.7 01/08/2015 0850   CL 106 05/25/2018 1355   CL 101 01/08/2015 0850   CO2 25 05/25/2018 1355   CO2 26 01/08/2015 0850   GLUCOSE 105 (H) 05/25/2018 1355   GLUCOSE 161 (H) 01/08/2015 0850   BUN 14 05/25/2018 1355   BUN 8 03/04/2017 0918   BUN 17 01/08/2015 0850   CREATININE 0.89 05/25/2018 1355   CREATININE 0.82 01/22/2015 1559   CALCIUM 9.0 05/25/2018 1355   CALCIUM 9.3 01/08/2015 0850   PROT 7.3 05/25/2018 1355   PROT 6.4 03/04/2017 0918   PROT 6.8 01/22/2015 1559   ALBUMIN 4.0 05/25/2018 1355   ALBUMIN 2.5 (L) 03/04/2017 0918   ALBUMIN 3.9 01/22/2015 1559   AST 30 05/25/2018 1355   AST 34 01/22/2015 1559   ALT 19 05/25/2018 1355   ALT 42 01/22/2015 1559   ALKPHOS 93 05/25/2018 1355   ALKPHOS 157 (H) 01/22/2015  1559   BILITOT 1.3 (H) 05/25/2018 1355   BILITOT 6.2 (H) 03/04/2017 0918   BILITOT 0.2 (L) 01/22/2015 1559   GFRNONAA >60 05/25/2018 1355   GFRNONAA >60 01/22/2015 1559   GFRAA >60 05/25/2018 1355   GFRAA >60 01/22/2015 1559    No results found for: SPEP, UPEP  Lab Results  Component Value Date   WBC 2.4 (L) 05/25/2018   NEUTROABS 1.3 (L) 05/25/2018   HGB 14.0 05/25/2018   HCT 41.1 05/25/2018   MCV 100.2 (H) 05/25/2018   PLT 147 (L) 05/25/2018      Chemistry      Component Value Date/Time   NA 138 05/25/2018 1355   NA 135 03/04/2017 0918   NA 135 01/08/2015 0850   K 4.2 05/25/2018 1355   K 3.7 01/08/2015 0850   CL 106 05/25/2018 1355   CL 101 01/08/2015 0850   CO2 25 05/25/2018 1355   CO2 26 01/08/2015 0850   BUN 14 05/25/2018 1355   BUN 8 03/04/2017 0918   BUN 17 01/08/2015 0850   CREATININE 0.89 05/25/2018 1355   CREATININE 0.82 01/22/2015 1559      Component Value Date/Time   CALCIUM 9.0 05/25/2018 1355   CALCIUM 9.3 01/08/2015 0850   ALKPHOS 93 05/25/2018 1355   ALKPHOS 157 (H) 01/22/2015 1559   AST 30 05/25/2018 1355   AST 34 01/22/2015 1559   ALT 19 05/25/2018 1355   ALT 42 01/22/2015 1559   BILITOT 1.3 (H) 05/25/2018 1355   BILITOT 6.2 (H) 03/04/2017 0918   BILITOT 0.2 (L) 01/22/2015 1559       RADIOGRAPHIC STUDIES: I have personally reviewed the radiological images as listed and agreed with the findings in the report. No results found.   ASSESSMENT & PLAN:  Carcinoma of upper-outer quadrant of left breast in female, estrogen receptor positive ([place]) #Recurrent metastatic breast cancer-ER PR positive HER-2 negative;   On xeloda- CT AUG 21st 2019- STABLE liver lesions.  # Continue xeloda 2 pills in AM &  2 pills in PN; 1 w-On and 1 w OFF. Labs today reviewed.   # hand foot syndrome-/hyperpigmentation of hand and feet- G-1 continue kenalog; urea 20%; vaseline bid. STABLE.   # Brain metastases-recurrent/likely leptomeningeal disease;  S/p WBRT  [April 2019].  Clinically stable  #Peripheral neuropathy grade 1-STABLE.  on Lyrica  # follow up in 3 week/labs;port flush.   # I reviewed the blood work- with the patient in detail; also reviewed the imaging independently [as summarized above]; and with the patient in detail.     Orders Placed This Encounter  Procedures  . Comprehensive metabolic panel    Standing Status:   Future    Standing Expiration Date:   05/26/2019  . CBC with Differential    Standing Status:   Future    Standing Expiration Date:   05/26/2019  . Cancer antigen 27.29    Standing Status:   Future    Standing Expiration Date:   05/26/2019   All questions were answered. The patient knows to call the clinic with any problems, questions or concerns.      Cammie Sickle, MD 05/26/2018 2:55 PM

## 2018-05-25 NOTE — Assessment & Plan Note (Addendum)
#  Recurrent metastatic breast cancer-ER PR positive HER-2 negative;   On xeloda- CT AUG 21st 2019- STABLE liver lesions.  # Continue xeloda 2 pills in AM & 2 pills in PN; 1 w-On and 1 w OFF. Labs today reviewed.   # hand foot syndrome-/hyperpigmentation of hand and feet- G-1 continue kenalog; urea 20%; vaseline bid. STABLE.   # Brain metastases-recurrent/likely leptomeningeal disease;  S/p WBRT [April 2019].  Clinically stable  #Peripheral neuropathy grade 1-STABLE.  on Lyrica  # follow up in 3 week/labs;port flush.   # I reviewed the blood work- with the patient in detail; also reviewed the imaging independently [as summarized above]; and with the patient in detail.

## 2018-05-26 LAB — CANCER ANTIGEN 27.29: CA 27.29: 14.5 U/mL (ref 0.0–38.6)

## 2018-06-06 ENCOUNTER — Other Ambulatory Visit: Payer: Self-pay | Admitting: Internal Medicine

## 2018-06-06 DIAGNOSIS — C50412 Malignant neoplasm of upper-outer quadrant of left female breast: Secondary | ICD-10-CM

## 2018-06-06 DIAGNOSIS — Z170 Estrogen receptor positive status [ER+]: Principal | ICD-10-CM

## 2018-06-06 NOTE — Telephone Encounter (Signed)
...     Ref Range & Units 12d ago  Potassium 3.5 - 5.1 mmol/L 4.2

## 2018-06-17 ENCOUNTER — Inpatient Hospital Stay: Payer: BLUE CROSS/BLUE SHIELD | Attending: Internal Medicine

## 2018-06-17 ENCOUNTER — Other Ambulatory Visit: Payer: Self-pay

## 2018-06-17 ENCOUNTER — Inpatient Hospital Stay (HOSPITAL_BASED_OUTPATIENT_CLINIC_OR_DEPARTMENT_OTHER): Payer: BLUE CROSS/BLUE SHIELD | Admitting: Internal Medicine

## 2018-06-17 ENCOUNTER — Encounter: Payer: Self-pay | Admitting: Internal Medicine

## 2018-06-17 DIAGNOSIS — L271 Localized skin eruption due to drugs and medicaments taken internally: Secondary | ICD-10-CM | POA: Insufficient documentation

## 2018-06-17 DIAGNOSIS — Z79899 Other long term (current) drug therapy: Secondary | ICD-10-CM | POA: Diagnosis not present

## 2018-06-17 DIAGNOSIS — G629 Polyneuropathy, unspecified: Secondary | ICD-10-CM | POA: Insufficient documentation

## 2018-06-17 DIAGNOSIS — L819 Disorder of pigmentation, unspecified: Secondary | ICD-10-CM

## 2018-06-17 DIAGNOSIS — C7931 Secondary malignant neoplasm of brain: Secondary | ICD-10-CM

## 2018-06-17 DIAGNOSIS — Z170 Estrogen receptor positive status [ER+]: Secondary | ICD-10-CM | POA: Insufficient documentation

## 2018-06-17 DIAGNOSIS — C50412 Malignant neoplasm of upper-outer quadrant of left female breast: Secondary | ICD-10-CM | POA: Diagnosis not present

## 2018-06-17 LAB — CBC WITH DIFFERENTIAL/PLATELET
BASOS PCT: 1 %
Basophils Absolute: 0 10*3/uL (ref 0–0.1)
EOS PCT: 2 %
Eosinophils Absolute: 0.1 10*3/uL (ref 0–0.7)
HCT: 40 % (ref 35.0–47.0)
Hemoglobin: 13.8 g/dL (ref 12.0–16.0)
Lymphocytes Relative: 28 %
Lymphs Abs: 0.8 10*3/uL — ABNORMAL LOW (ref 1.0–3.6)
MCH: 35 pg — ABNORMAL HIGH (ref 26.0–34.0)
MCHC: 34.5 g/dL (ref 32.0–36.0)
MCV: 101.3 fL — ABNORMAL HIGH (ref 80.0–100.0)
MONO ABS: 0.3 10*3/uL (ref 0.2–0.9)
MONOS PCT: 9 %
Neutro Abs: 1.6 10*3/uL (ref 1.4–6.5)
Neutrophils Relative %: 60 %
PLATELETS: 162 10*3/uL (ref 150–440)
RBC: 3.95 MIL/uL (ref 3.80–5.20)
RDW: 17.2 % — AB (ref 11.5–14.5)
WBC: 2.7 10*3/uL — ABNORMAL LOW (ref 3.6–11.0)

## 2018-06-17 LAB — COMPREHENSIVE METABOLIC PANEL
ALBUMIN: 4 g/dL (ref 3.5–5.0)
ALT: 21 U/L (ref 0–44)
AST: 32 U/L (ref 15–41)
Alkaline Phosphatase: 92 U/L (ref 38–126)
Anion gap: 8 (ref 5–15)
BUN: 14 mg/dL (ref 8–23)
CALCIUM: 9.2 mg/dL (ref 8.9–10.3)
CHLORIDE: 106 mmol/L (ref 98–111)
CO2: 26 mmol/L (ref 22–32)
CREATININE: 0.84 mg/dL (ref 0.44–1.00)
Glucose, Bld: 94 mg/dL (ref 70–99)
Potassium: 4.1 mmol/L (ref 3.5–5.1)
Sodium: 140 mmol/L (ref 135–145)
Total Bilirubin: 0.9 mg/dL (ref 0.3–1.2)
Total Protein: 7.5 g/dL (ref 6.5–8.1)

## 2018-06-17 MED ORDER — SODIUM CHLORIDE 0.9% FLUSH
10.0000 mL | INTRAVENOUS | Status: DC | PRN
  Administered 2018-06-17: 10 mL via INTRAVENOUS
  Filled 2018-06-17: qty 10

## 2018-06-17 MED ORDER — HEPARIN SOD (PORK) LOCK FLUSH 100 UNIT/ML IV SOLN
500.0000 [IU] | Freq: Once | INTRAVENOUS | Status: AC
  Administered 2018-06-17: 500 [IU] via INTRAVENOUS

## 2018-06-17 NOTE — Assessment & Plan Note (Addendum)
#  Recurrent metastatic breast cancer-ER PR positive HER-2 negative;   On xeloda- CT AUG 21st 2019- liver lesions-stable.  # Continue xeloda 2 pills in AM & 2 pills in PN; 1 w-On and 1 w OFF. Labs today reviewed.   # hand foot syndrome-/hyperpigmentation of hand and feet- G-1 continue kenalog; urea 20%; vaseline bid.stable.  # Brain metastases-recurrent/likely leptomeningeal disease;  S/p WBRT [April 2019]. July MRI- improving;  Clinically stable.  #Peripheral neuropathy grade 1-  on Lyrica; stable  # follow up in 3 week/labs

## 2018-06-17 NOTE — Progress Notes (Signed)
Sarah Horne OFFICE PROGRESS NOTE  Patient Care Team: Sarah Sizer, MD as PCP - General (Family Medicine) Sarah Horne, Sarah Gleason, MD (General Surgery) Sarah Alf, MD (Inactive) as Attending Physician (Internal Medicine)  Cancer Staging No matching staging information was found for the patient.   Oncology History   # LEFT BREAST IDC; STAGE II [cT2N1] ER >90%; PR- 50-90%; her 2 NEU- POS; s/p Neoadj chemo; AUG 2016- TCH+P s/p Lumpec & partial ALND- path CR; s/p RT [finished Nov 2016];  adj Herceptin; HELD for Jan 19th 2017 [in DEC 2016-EF dropped from 63 to 51%; FEB 27th EF-42.9%]; JAN 2016 START Arimidex; May 2017- EF- 60%; May 24th 2017-Re-start Herceptin q 3W; July 28th STOP herceptin [finished 54m/m2; sec to Low EF; however 2017 Oct EF= 67%; improved]  # DEC 4th 2017- Start Neratinib 4 pills; DEC 11th 3 pills; STOPPED.  # FEB 2018- RECURRENCE ER/PR positive; Her 2 NEGATIVE [liver Bx]  # MARCH 1st 2018- Tax-Cytoxan [poor tol]  # FEB 2018- Brain mets [SBRT; Dr.Crystal; finished Feb 28th 2018]  # March 2018- Eribulin- poor tolerance  # June 8th 2018- Started Faslodex + Abema [abema-multiple interruptions sec to neutropenia]  # MRI Brain- March 2019- Leptomeningeal mets [WBRT; No LP s/p WBRT- 01/09/2018]; STOP Abema [542mday-sec to severe cytopenia]+faslodex  # MAY 20tth 019- Start Xeloda    # PN- G-2; may 2017- Cymbalta 6082m;MARCH 2018-  acute vascular insuff of BIL LE [? Taxotere vasoconstriction on asprin]; # hemorragic shock LGIB [s/p colo; Dr.Wohl]  #  SVT- on flecainide.   # April 2016- Liver Bx- NEG;   # Drop in EF from Herceptin [recovered OCt 27th- EF 65%]  # BMD- jan 2017- osteopenia  -----------------------------------------------------------     MOLAlphae- Sep 4th 2018- PDL-1 0%/NEG for BRCA; MSI-Stable; Positive for PI3K; ccnd-1; FGF**  -----------------------------------------------------------  Dx: Metastatic  Breast cancer [ER/PR-Pos; her -2 NEG] Stage IV; goals: palliative Current treatment- Xeloda [may 20th 2019]     Carcinoma of upper-outer quadrant of left breast in female, [place] receptor positive [place]    INTERVAL HISTORY:  Sarah Horne 55o.  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  Patient continues to be anxious about her darkish pigmentation of the hand and feet.  No pain.   Patient denies any fevers or chills but denies any weight loss but denies any nausea vomiting.  Denies any headaches.  Review of Systems  Constitutional: Positive for malaise/fatigue. Negative for chills, diaphoresis, fever and weight loss.  HENT: Negative for nosebleeds and sore throat.   Eyes: Negative for double vision.  Respiratory: Negative for cough, hemoptysis, sputum production, shortness of breath and wheezing.   Cardiovascular: Negative for chest pain, palpitations, orthopnea and leg swelling.  Gastrointestinal: Negative for abdominal pain, blood in stool, constipation, diarrhea, heartburn, melena, nausea and vomiting.  Genitourinary: Negative for dysuria, frequency and urgency.  Musculoskeletal: Positive for joint pain. Negative for back pain.  Skin: Positive for rash. Negative for itching.  Neurological: Positive for tingling. Negative for dizziness, focal weakness, weakness and headaches.  Endo/Heme/Allergies: Does not bruise/bleed easily.  Psychiatric/Behavioral: Negative for depression. The patient is not nervous/anxious and does not have insomnia.       PAST MEDICAL HISTORY :  Past Medical History:  Diagnosis Date  . Chemotherapy-induced peripheral neuropathy ([place] . Epistaxis    a. 11/2016 in setting of asa/plavix-->silver nitrate cauterization.  .Marland Kitchen  GI bleed    a. 11/2016 Admission w/ GIB and hypovolemic shock req 3u PRBC's;  b. 11/2016 ECG: gastritis & nonbleeding peptic  ulcer; c. 11/2016 Conlonoscopy: rectal and sigmoid colonic ulcers.  . Heart attack ([place])    a. 1998 Cath @ [facility]: reportedly no intervention required.  Marland Kitchen Herceptin-induced cardiomyopathy ([place])    a. In the setting of Herceptin Rx for breast cancer (initiated 12/2014); b. 03/2015 MUGA EF 64%; b. 08/2015 MUGA: EF 51%; c. 10/2015 MUGA: EF 44%; d. 11/2015 Echo: EF 45-50%; e. 01/2016 MUGA: EF 60%; f. 06/2016 MUGA EF 65%; g. 10/2016 MUGA: EF 61%;  h. 12/2016 Echo: EF 55-60%, gr1 DD.  Marland Kitchen Hyperlipidemia   . Hypertension   . Neuropathy   . Possible PAD (peripheral artery disease) ([place])    a. 11/2016 LE cyanosis and weak pulses-->CTA w/o significant Ao-BiFem dzs. ? distal dzs-->ASA/Plavix initiated by vascular surgery.  Marland Kitchen PSVT (paroxysmal supraventricular tachycardia) ([place])    a. Dx 11/2016.  . Pulmonary embolism ([place])    a. 12/2016 CTA Chest: small nonocclusive PE in inferior segment of the Left lingula, somewhat eccentric filling defect suggesting chronic rather than acute embolic event; b. 01/92 LE U/S:  No DVT; c. 12/2016 Echo: Nl RV fxn, nl PASP.  Marland Kitchen Recurrent Metastatic breast cancer ([place])    a. Dx 2016: Stage II, ER positive, PR positive, HER-2/neu overexpressing of the left breast-->chemo/radiation; b. 10/2016 CT Abd/pelvis: diffuse liver mets, ill defined sclerotic bone lesions-T12;  c. 10/2016 MRI brain: metastatic lesion along L temporal lobe (19x62m) w/ extensive surrounding edema & 569mmidline shift to right.  . Sinus tachycardia     PAST SURGICAL HISTORY :   Past Surgical History:  Procedure Laterality Date  . BREAST BIOPSY Left 2016   Positive  . BREAST LUMPECTOMY WITH SENTINEL LYMPH NODE BIOPSY Left 05/23/2015   Procedure: LEFT BREAST WIDE EXCISION WITH AXILLARY DISSECTION, MASTOPLASTY ;  Surgeon: JeRobert BellowMD;  Location: [facility] ORS;  Service: General;  Laterality: Left;  . BREAST SURGERY Left 12/18/14   breast biopsy/INVASIVE DUCTAL CARCINOMA OF BREAST, NOTTINGHAM GRADE 2.  . Marland KitchenREAST SURGERY   05/23/2015.   Wide excision/mastoplasty, axillary dissection. No residual invasive cancer, positive for residual DCIS. 0/2 nodes identified on axillary dissection. (no SLN by technetium or methylene blue)  . CARDIAC CATHETERIZATION    . COLONOSCOPY WITH PROPOFOL N/A 12/10/2016   Procedure: COLONOSCOPY WITH PROPOFOL;  Surgeon: DaLucilla LameMD;  Location: [facility] ENDOSCOPY;  Service: Endoscopy;  Laterality: N/A;  . COLONOSCOPY WITH PROPOFOL N/A 02/13/2017   Procedure: COLONOSCOPY WITH PROPOFOL;  Surgeon: WoLucilla LameMD;  [facility];  Service: Endoscopy;  Laterality: N/A;  . ESOPHAGOGASTRODUODENOSCOPY (EGD) WITH PROPOFOL N/A 12/08/2016   Procedure: ESOPHAGOGASTRODUODENOSCOPY (EGD) WITH PROPOFOL;  Surgeon: DaLucilla LameMD;  Location: [facility] END[facility]COPY;  Service: Endoscopy;  Laterality: N/A;  . IVC FILTER INSERTION N/A 02/15/2017   Procedure: IVC Filter Insertion;  Surgeon: DeAlgernon HuxleyMD;  [facility];  Service: Cardiovascular;  Laterality: N/A;  . PORTACATH PLACEMENT Right 12-31-14   Dr ByBary Horne  FAMILY HISTORY :   Family History  Problem Relation Age of Onset  . Breast cancer Maternal Aunt   . Breast cancer Cousin   . Brain cancer Maternal Uncle   . Diabetes Mother   . Hypertension Mother   . Stroke Mother     SOCIAL HISTORY:   Social History   Tobacco Use  . Smoking status: Light Tobacco Smoker  Packs/day: 0.50    Years: 18.00    Pack years: 9.00    Types: Cigarettes  . Smokeless tobacco: Never Used  Substance Use Topics  . Alcohol use: No    Alcohol/week: 0.0 standard drinks  . Drug use: No    ALLERGIES:  is allergic to no known allergies.  MEDICATIONS:  Current Outpatient Medications  Medication Sig Dispense Refill  . capecitabine (XELODA) 500 MG tablet TAKE 4 TABLETS BY MOUTH TWICE DAILY AFTER A MEAL. TAKE FOR 1 WEEK ON THEN 1 WEEK OFF 112 tablet 0  . flecainide (TAMBOCOR) 50 MG tablet Take 1 tablet (50 mg total) by mouth 2 (two) times  daily. 60 tablet 11  . KLOR-CON M20 20 MEQ tablet TAKE ONE TABLE BY MOUTH TWICE A DAY 60 tablet 3  . levocetirizine (XYZAL) 5 MG tablet Take 1 tablet (5 mg total) by mouth every evening. 30 tablet 3  . LYRICA 75 MG capsule TAKE ONE CAPSULE BY MOUTH TWICE A DAY 60 capsule 4  . magnesium oxide (MAG-OX) 400 MG tablet TAKE 1 TABLET (400 MG TOTAL) BY MOUTH 2 (TWO) TIMES DAILY. 60 tablet 6  . potassium chloride (KLOR-CON) 20 MEQ packet Take by mouth 2 (two) times daily.    Marland Kitchen triamcinolone ointment (KENALOG) 0.5 % Apply 1 application topically 2 (two) times daily. 30 g 2  . Vitamin D, Ergocalciferol, (DRISDOL) 50000 units CAPS capsule TAKE 1 CAPSULE (50,000 UNITS TOTAL) ONCE A WEEK BY MOUTH. 12 capsule 1  . acetic acid-hydrocortisone (VOSOL-HC) OTIC solution Place 2 drops into both ears daily as needed. (Patient not taking: Reported on 05/25/2018) 10 mL 0   Current Facility-Administered Medications  Medication Dose Route Frequency Provider Last Rate Last Dose  . sodium chloride flush (NS) 0.9 % injection 10 mL  10 mL Intravenous PRN Cammie Sickle, MD   10 mL at 06/17/18 1048   Facility-Administered Medications Ordered in Other Visits  Medication Dose Route Frequency Provider Last Rate Last Dose  . 0.9 %  sodium chloride infusion   Intravenous Continuous Pandit, Sandeep, MD      . 0.9 %  sodium chloride infusion   Intravenous Continuous Lloyd Huger, MD 999 mL/hr at 04/24/15 1520    . heparin lock flush 100 unit/mL  500 Units Intravenous Once Berenzon, Dmitriy, MD      . sodium chloride 0.9 % injection 10 mL  10 mL Intravenous PRN Sarah Alf, MD   10 mL at 04/04/15 1440  . sodium chloride flush (NS) 0.9 % injection 10 mL  10 mL Intravenous PRN Berenzon, Dmitriy, MD      . Tbo-Filgrastim (GRANIX) injection 480 mcg  480 mcg Subcutaneous Once Cammie Sickle, MD        PHYSICAL EXAMINATION: ECOG PERFORMANCE STATUS: 1 - Symptomatic but completely ambulatory  BP (!) 143/97    Pulse 99   Temp 97.6 F (36.4 C) (Tympanic)   Resp 18   There were no vitals filed for this visit.  Physical Exam  Constitutional: She is oriented to person, place, and time and well-developed, well-nourished, and in no distress.  Alone.  Walking herself.  HENT:  Head: Normocephalic and atraumatic.  Mouth/Throat: Oropharynx is clear and moist. No oropharyngeal exudate.  Eyes: Pupils are equal, round, and reactive to light.  Neck: Normal range of motion. Neck supple.  Cardiovascular: Normal rate and regular rhythm.  Pulmonary/Chest: No respiratory distress. She has no wheezes.  Abdominal: Soft. Bowel sounds are normal. She exhibits no  distension and no mass. There is no tenderness. There is no rebound and no guarding.  Musculoskeletal: Normal range of motion. She exhibits no edema or tenderness.  Neurological: She is alert and oriented to person, place, and time.  Skin: Skin is warm.  Significant hyperpigmentation of the bilateral hand and feet.  No desquamation.  Psychiatric: Affect normal.       LABORATORY DATA:  I have reviewed the data as listed    Component Value Date/Time   NA 140 06/17/2018 1043   NA 135 03/04/2017 0918   NA 135 01/08/2015 0850   K 4.1 06/17/2018 1043   K 3.7 01/08/2015 0850   CL 106 06/17/2018 1043   CL 101 01/08/2015 0850   CO2 26 06/17/2018 1043   CO2 26 01/08/2015 0850   GLUCOSE 94 06/17/2018 1043   GLUCOSE 161 (H) 01/08/2015 0850   BUN 14 06/17/2018 1043   BUN 8 03/04/2017 0918   BUN 17 01/08/2015 0850   CREATININE 0.84 06/17/2018 1043   CREATININE 0.82 01/22/2015 1559   CALCIUM 9.2 06/17/2018 1043   CALCIUM 9.3 01/08/2015 0850   PROT 7.5 06/17/2018 1043   PROT 6.4 03/04/2017 0918   PROT 6.8 01/22/2015 1559   ALBUMIN 4.0 06/17/2018 1043   ALBUMIN 2.5 (L) 03/04/2017 0918   ALBUMIN 3.9 01/22/2015 1559   AST 32 06/17/2018 1043   AST 34 01/22/2015 1559   ALT 21 06/17/2018 1043   ALT 42 01/22/2015 1559   ALKPHOS 92 06/17/2018 1043    ALKPHOS 157 (H) 01/22/2015 1559   BILITOT 0.9 06/17/2018 1043   BILITOT 6.2 (H) 03/04/2017 0918   BILITOT 0.2 (L) 01/22/2015 1559   GFRNONAA >60 06/17/2018 1043   GFRNONAA >60 01/22/2015 1559   GFRAA >60 06/17/2018 1043   GFRAA >60 01/22/2015 1559    No results found for: SPEP, UPEP  Lab Results  Component Value Date   WBC 2.7 (L) 06/17/2018   NEUTROABS 1.6 06/17/2018   HGB 13.8 06/17/2018   HCT 40.0 06/17/2018   MCV 101.3 (H) 06/17/2018   PLT 162 06/17/2018      Chemistry      Component Value Date/Time   NA 140 06/17/2018 1043   NA 135 03/04/2017 0918   NA 135 01/08/2015 0850   K 4.1 06/17/2018 1043   K 3.7 01/08/2015 0850   CL 106 06/17/2018 1043   CL 101 01/08/2015 0850   CO2 26 06/17/2018 1043   CO2 26 01/08/2015 0850   BUN 14 06/17/2018 1043   BUN 8 03/04/2017 0918   BUN 17 01/08/2015 0850   CREATININE 0.84 06/17/2018 1043   CREATININE 0.82 01/22/2015 1559      Component Value Date/Time   CALCIUM 9.2 06/17/2018 1043   CALCIUM 9.3 01/08/2015 0850   ALKPHOS 92 06/17/2018 1043   ALKPHOS 157 (H) 01/22/2015 1559   AST 32 06/17/2018 1043   AST 34 01/22/2015 1559   ALT 21 06/17/2018 1043   ALT 42 01/22/2015 1559   BILITOT 0.9 06/17/2018 1043   BILITOT 6.2 (H) 03/04/2017 0918   BILITOT 0.2 (L) 01/22/2015 1559       RADIOGRAPHIC STUDIES: I have personally reviewed the radiological images as listed and agreed with the findings in the report. No results found.   ASSESSMENT & PLAN:  Carcinoma of upper-outer quadrant of left breast in female, estrogen receptor positive ([place]) #Recurrent metastatic breast cancer-ER PR positive HER-2 negative;   On xeloda- CT AUG 21st 2019- liver lesions-stable.  #  Continue xeloda 2 pills in AM & 2 pills in PN; 1 w-On and 1 w OFF. Labs today reviewed.   # hand foot syndrome-/hyperpigmentation of hand and feet- G-1 continue kenalog; urea 20%; vaseline bid.stable.  # Brain metastases-recurrent/likely leptomeningeal disease;  S/p  WBRT [April 2019]. July MRI- improving;  Clinically stable.  #Peripheral neuropathy grade 1-  on Lyrica; stable  # follow up in 3 week/labs    Orders Placed This Encounter  Procedures  . Comprehensive metabolic panel    Standing Status:   Future    Standing Expiration Date:   06/18/2019  . CBC with Differential    Standing Status:   Future    Standing Expiration Date:   06/18/2019  . Cancer antigen 27.29    Standing Status:   Future    Standing Expiration Date:   06/18/2019   All questions were answered. The patient knows to call the clinic with any problems, questions or concerns.      Cammie Sickle, MD 06/20/2018 11:46 PM

## 2018-06-18 LAB — CANCER ANTIGEN 27.29: CA 27.29: 16.4 U/mL (ref 0.0–38.6)

## 2018-06-29 ENCOUNTER — Other Ambulatory Visit: Payer: Self-pay | Admitting: Internal Medicine

## 2018-06-29 DIAGNOSIS — C50412 Malignant neoplasm of upper-outer quadrant of left female breast: Secondary | ICD-10-CM

## 2018-06-29 DIAGNOSIS — Z170 Estrogen receptor positive status [ER+]: Principal | ICD-10-CM

## 2018-07-08 ENCOUNTER — Encounter: Payer: Self-pay | Admitting: Internal Medicine

## 2018-07-08 ENCOUNTER — Inpatient Hospital Stay: Payer: BLUE CROSS/BLUE SHIELD | Attending: Internal Medicine

## 2018-07-08 ENCOUNTER — Other Ambulatory Visit: Payer: Self-pay

## 2018-07-08 ENCOUNTER — Inpatient Hospital Stay (HOSPITAL_BASED_OUTPATIENT_CLINIC_OR_DEPARTMENT_OTHER): Payer: BLUE CROSS/BLUE SHIELD | Admitting: Internal Medicine

## 2018-07-08 VITALS — BP 120/80 | HR 121 | Temp 97.3°F | Resp 16 | Wt 161.9 lb

## 2018-07-08 DIAGNOSIS — F17210 Nicotine dependence, cigarettes, uncomplicated: Secondary | ICD-10-CM | POA: Diagnosis not present

## 2018-07-08 DIAGNOSIS — Z7902 Long term (current) use of antithrombotics/antiplatelets: Secondary | ICD-10-CM

## 2018-07-08 DIAGNOSIS — Z79899 Other long term (current) drug therapy: Secondary | ICD-10-CM | POA: Insufficient documentation

## 2018-07-08 DIAGNOSIS — Z170 Estrogen receptor positive status [ER+]: Secondary | ICD-10-CM | POA: Diagnosis not present

## 2018-07-08 DIAGNOSIS — C7931 Secondary malignant neoplasm of brain: Secondary | ICD-10-CM | POA: Diagnosis not present

## 2018-07-08 DIAGNOSIS — C50412 Malignant neoplasm of upper-outer quadrant of left female breast: Secondary | ICD-10-CM | POA: Insufficient documentation

## 2018-07-08 DIAGNOSIS — M8580 Other specified disorders of bone density and structure, unspecified site: Secondary | ICD-10-CM | POA: Insufficient documentation

## 2018-07-08 DIAGNOSIS — C787 Secondary malignant neoplasm of liver and intrahepatic bile duct: Secondary | ICD-10-CM | POA: Diagnosis not present

## 2018-07-08 DIAGNOSIS — I10 Essential (primary) hypertension: Secondary | ICD-10-CM | POA: Insufficient documentation

## 2018-07-08 DIAGNOSIS — C7949 Secondary malignant neoplasm of other parts of nervous system: Secondary | ICD-10-CM | POA: Insufficient documentation

## 2018-07-08 DIAGNOSIS — I471 Supraventricular tachycardia: Secondary | ICD-10-CM | POA: Diagnosis not present

## 2018-07-08 LAB — COMPREHENSIVE METABOLIC PANEL WITH GFR
ALT: 21 U/L (ref 0–44)
AST: 34 U/L (ref 15–41)
Albumin: 4.1 g/dL (ref 3.5–5.0)
Alkaline Phosphatase: 98 U/L (ref 38–126)
Anion gap: 8 (ref 5–15)
BUN: 20 mg/dL (ref 8–23)
CO2: 27 mmol/L (ref 22–32)
Calcium: 9.6 mg/dL (ref 8.9–10.3)
Chloride: 104 mmol/L (ref 98–111)
Creatinine, Ser: 0.87 mg/dL (ref 0.44–1.00)
GFR calc Af Amer: >60 mL/min
GFR calc non Af Amer: >60 mL/min
Glucose, Bld: 91 mg/dL (ref 70–99)
Potassium: 4.4 mmol/L (ref 3.5–5.1)
Sodium: 139 mmol/L (ref 135–145)
Total Bilirubin: 1.4 mg/dL — ABNORMAL HIGH (ref 0.3–1.2)
Total Protein: 7.5 g/dL (ref 6.5–8.1)

## 2018-07-08 LAB — CBC WITH DIFFERENTIAL/PLATELET
ABS IMMATURE GRANULOCYTES: 0 10*3/uL (ref 0.00–0.07)
BASOS ABS: 0 10*3/uL (ref 0.0–0.1)
Basophils Relative: 0 %
Eosinophils Absolute: 0 10*3/uL (ref 0.0–0.5)
Eosinophils Relative: 1 %
HCT: 42 % (ref 36.0–46.0)
HEMOGLOBIN: 14 g/dL (ref 12.0–15.0)
Immature Granulocytes: 0 %
LYMPHS PCT: 31 %
Lymphs Abs: 0.8 10*3/uL (ref 0.7–4.0)
MCH: 33 pg (ref 26.0–34.0)
MCHC: 33.3 g/dL (ref 30.0–36.0)
MCV: 99.1 fL (ref 80.0–100.0)
MONO ABS: 0.3 10*3/uL (ref 0.1–1.0)
Monocytes Relative: 11 %
NEUTROS ABS: 1.5 10*3/uL — AB (ref 1.7–7.7)
NRBC: 0 % (ref 0.0–0.2)
Neutrophils Relative %: 57 %
PLATELETS: 161 10*3/uL (ref 150–400)
RBC: 4.24 MIL/uL (ref 3.87–5.11)
RDW: 16.4 % — ABNORMAL HIGH (ref 11.5–15.5)
WBC: 2.7 10*3/uL — AB (ref 4.0–10.5)

## 2018-07-08 NOTE — Progress Notes (Signed)
[place] OFFICE PROGRESS NOTE  Patient Care Team: Steele Sizer, MD as PCP - General (Family Medicine) Bary Castilla, Forest Gleason, MD (General Surgery) [name], MD (Inactive) as Attending Physician (Internal Medicine)  Cancer Staging No matching staging information was found for the patient.   Oncology [place]   # LEFT BREAST IDC; STAGE II [cT2N1] ER >90%; PR- 50-90%; her 2 NEU- POS; s/p Neoadj chemo; AUG 2016- TCH+P s/p Lumpec & partial ALND- path CR; s/p RT [finished Nov 2016];  adj Herceptin; HELD for Jan 19th 2017 [in DEC 2016-EF dropped from 63 to 51%; FEB 27th EF-42.9%]; JAN 2016 START Arimidex; May 2017- EF- 60%; May 24th 2017-Re-start Herceptin q 3W; July 28th STOP herceptin [finished 69m/m2; sec to Low EF; however 2017 Oct EF= 67%; improved]  # DEC 4th 2017- Start Neratinib 4 pills; DEC 11th 3 pills; STOPPED.  # FEB 2018- RECURRENCE ER/PR positive; Her 2 NEGATIVE [liver Bx]  # MARCH 1st 2018- Tax-Cytoxan [poor tol]  # FEB 2018- Brain mets [SBRT; Dr.Crystal; finished Feb 28th 2018]  # March 2018- Eribulin- poor tolerance  # June 8th 2018- Started Faslodex + Abema [abema-multiple interruptions sec to neutropenia]  # MRI Brain- March 2019- Leptomeningeal mets [WBRT; No LP s/p WBRT- 01/09/2018]; STOP Abema [529mday-sec to severe cytopenia]+faslodex  # MAY 20tth 019- Start Xeloda    # PN- G-2; may 2017- Cymbalta 6050m;MARCH 2018-  acute vascular insuff of BIL LE [? Taxotere vasoconstriction on asprin]; # hemorragic shock LGIB [s/p colo; Dr.Wohl]  #  SVT- on flecainide.   # April 2016- Liver Bx- NEG;   # Drop in EF from Herceptin [recovered OCt 27th- EF 65%]  # BMD- jan 2017- osteopenia  -----------------------------------------------------------     MOLMorencie- Sep 4th 2018- PDL-1 0%/NEG for BRCA; MSI-Stable; Positive for PI3K; ccnd-1; FGF** -----------------------------------------------------------  Dx: Metastatic  Breast cancer [ER/PR-Pos; her -2 NEG] Stage IV; goals: palliative Current treatment- Xeloda [may 20th 2019]     Carcinoma of upper-outer quadrant of left breast in female, estrogen receptor positive ([place]    INTERVAL H[place]:  BreJOHNAE Horne 39o.  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  Patient denies any headaches.  Denies any nausea vomiting.  Patient continues to be anxious about the dark pigmentation of her hand and feet.  Denies any pain.  Mild fatigue.  No diarrhea.  No sores in the mouth.  Joint pains.  Chronic tingling and numbness in extremities.   Review of Systems  Constitutional: Positive for malaise/fatigue. Negative for chills, diaphoresis, fever and weight loss.  HENT: Negative for nosebleeds and sore throat.   Eyes: Negative for double vision.  Respiratory: Negative for cough, hemoptysis, sputum production, shortness of breath and wheezing.   Cardiovascular: Negative for chest pain, palpitations, orthopnea and leg swelling.  Gastrointestinal: Negative for abdominal pain, blood in stool, constipation, diarrhea, heartburn, melena, nausea and vomiting.  Genitourinary: Negative for dysuria, frequency and urgency.  Musculoskeletal: Positive for joint pain. Negative for back pain.  Skin: Positive for rash. Negative for itching.  Neurological: Positive for tingling. Negative for dizziness, focal weakness, weakness and headaches.  Endo/Heme/Allergies: Does not bruise/bleed easily.  Psychiatric/Behavioral: Negative for depression. The patient is not nervous/anxious and does not have insomnia.       PAST MEDICAL [place] :  Past Medical [place]:  Diagnosis Date  . Chemotherapy-induced peripheral neuropathy ([place] .  Epistaxis    a. 11/2016 in setting of asa/plavix-->silver nitrate cauterization.  . GI bleed    a. 11/2016 Admission w/ GIB and  hypovolemic shock req 3u PRBC's;  b. 11/2016 ECG: gastritis & nonbleeding peptic ulcer; c. 11/2016 Conlonoscopy: rectal and sigmoid colonic ulcers.  . Heart attack ([place])    a. 1998 Cath @ [facility]: reportedly no intervention required.  Marland Kitchen Herceptin-induced cardiomyopathy ([place])    a. In the setting of Herceptin Rx for breast cancer (initiated 12/2014); b. 03/2015 MUGA EF 64%; b. 08/2015 MUGA: EF 51%; c. 10/2015 MUGA: EF 44%; d. 11/2015 Echo: EF 45-50%; e. 01/2016 MUGA: EF 60%; f. 06/2016 MUGA EF 65%; g. 10/2016 MUGA: EF 61%;  h. 12/2016 Echo: EF 55-60%, gr1 DD.  Marland Kitchen Hyperlipidemia   . Hypertension   . Neuropathy   . Possible PAD (peripheral artery disease) ([place])    a. 11/2016 LE cyanosis and weak pulses-->CTA w/o significant Ao-BiFem dzs. ? distal dzs-->ASA/Plavix initiated by vascular surgery.  Marland Kitchen PSVT (paroxysmal supraventricular tachycardia) ([place])    a. Dx 11/2016.  . Pulmonary embolism ([place])    a. 12/2016 CTA Chest: small nonocclusive PE in inferior segment of the Left lingula, somewhat eccentric filling defect suggesting chronic rather than acute embolic event; b. 05/4764 LE U/S:  No DVT; c. 12/2016 Echo: Nl RV fxn, nl PASP.  Marland Kitchen Recurrent Metastatic breast cancer ([place])    a. Dx 2016: Stage II, ER positive, PR positive, HER-2/neu overexpressing of the left breast-->chemo/radiation; b. 10/2016 CT Abd/pelvis: diffuse liver mets, ill defined sclerotic bone lesions-T12;  c. 10/2016 MRI brain: metastatic lesion along L temporal lobe (19x39m) w/ extensive surrounding edema & 560mmidline shift to right.  . Sinus tachycardia     PAST SURGICAL Horne :   Past Surgical Horne:  Procedure Laterality Date  . BREAST BIOPSY Left 2016   Positive  . BREAST LUMPECTOMY WITH SENTINEL LYMPH NODE BIOPSY Left 05/23/2015   Procedure: LEFT BREAST WIDE EXCISION WITH AXILLARY DISSECTION, MASTOPLASTY ;  Surgeon: JeRobert BellowMD;  Location: [facility] ORS;  Service: General;  Laterality: Left;  . BREAST SURGERY Left 12/18/14   breast  biopsy/INVASIVE DUCTAL CARCINOMA OF BREAST, NOTTINGHAM GRADE 2.  . Marland KitchenREAST SURGERY  05/23/2015.   Wide excision/mastoplasty, axillary dissection. No residual invasive cancer, positive for residual DCIS. 0/2 nodes identified on axillary dissection. (no SLN by technetium or methylene blue)  . CARDIAC CATHETERIZATION    . COLONOSCOPY WITH PROPOFOL N/A 12/10/2016   Procedure: COLONOSCOPY WITH PROPOFOL;  Surgeon: DaLucilla LameMD;  Location: [facility] END[facility]COPY;  Service: Endoscopy;  Laterality: N/A;  . COLONOSCOPY WITH PROPOFOL N/A 02/13/2017   Procedure: COLONOSCOPY WITH PROPOFOL;  Surgeon: WoLucilla LameMD;  [facility];  Service: Endoscopy;  Laterality: N/A;  . ESOPHAGOGASTRODUODENOSCOPY (EGD) WITH PROPOFOL N/A 12/08/2016   Procedure: ESOPHAGOGASTRODUODENOSCOPY (EGD) WITH PROPOFOL;  Surgeon: DaLucilla LameMD;  Location: [facility];  Service: Endoscopy;  Laterality: N/A;  . IVC FILTER INSERTION N/A 02/15/2017   Procedure: IVC Filter Insertion;  Surgeon: DeAlgernon HuxleyMD;  [facility];  Service: Cardiovascular;  Laterality: N/A;  . PORTACATH PLACEMENT Right 12-31-14   Dr ByBary Castilla  FAMILY Horne :   Family Horne  Problem Relation Age of Onset  . Breast cancer Maternal Aunt   . Breast cancer Cousin   . Brain cancer Maternal Uncle   . Diabetes Mother   . Hypertension Mother   . Stroke Mother     SOCIAL Horne:  Social Horne   Tobacco Use  . Smoking status: Light Tobacco Smoker    Packs/day: 0.50    Years: 18.00    Pack years: 9.00    Types: Cigarettes  . Smokeless tobacco: Never Used  Substance Use Topics  . Alcohol use: No    Alcohol/week: 0.0 standard drinks  . Drug use: No    ALLERGIES:  is allergic to no known allergies.  MEDICATIONS:  Current Outpatient Medications  Medication Sig Dispense Refill  . capecitabine (XELODA) 500 MG tablet TAKE 4 TABLETS BY MOUTH TWICE DAILY AFTER A MEAL. TAKE FOR 1 WEEK ON THEN 1 WEEK OFF 112 tablet 0  .  flecainide (TAMBOCOR) 50 MG tablet Take 1 tablet (50 mg total) by mouth 2 (two) times daily. 60 tablet 11  . KLOR-CON M20 20 MEQ tablet TAKE ONE TABLE BY MOUTH TWICE A DAY 60 tablet 3  . levocetirizine (XYZAL) 5 MG tablet Take 1 tablet (5 mg total) by mouth every evening. 30 tablet 3  . LYRICA 75 MG capsule TAKE ONE CAPSULE BY MOUTH TWICE A DAY 60 capsule 4  . magnesium oxide (MAG-OX) 400 MG tablet TAKE 1 TABLET (400 MG TOTAL) BY MOUTH 2 (TWO) TIMES DAILY. 60 tablet 6  . potassium chloride (KLOR-CON) 20 MEQ packet Take by mouth 2 (two) times daily.    Marland Kitchen triamcinolone ointment (KENALOG) 0.5 % Apply 1 application topically 2 (two) times daily. 30 g 2  . Vitamin D, Ergocalciferol, (DRISDOL) 50000 units CAPS capsule TAKE 1 CAPSULE (50,000 UNITS TOTAL) ONCE A WEEK BY MOUTH. 12 capsule 1  . acetic acid-hydrocortisone (VOSOL-HC) OTIC solution Place 2 drops into both ears daily as needed. (Patient not taking: Reported on 07/08/2018) 10 mL 0   No current facility-administered medications for this visit.    Facility-Administered Medications Ordered in Other Visits  Medication Dose Route Frequency Provider Last Rate Last Dose  . 0.9 %  sodium chloride infusion   Intravenous Continuous Pandit, Sandeep, MD      . 0.9 %  sodium chloride infusion   Intravenous Continuous Lloyd Huger, MD 999 mL/hr at 04/24/15 1520    . heparin lock flush 100 unit/mL  500 Units Intravenous Once Berenzon, Dmitriy, MD      . sodium chloride 0.9 % injection 10 mL  10 mL Intravenous PRN [name], MD   10 mL at 04/04/15 1440  . sodium chloride flush (NS) 0.9 % injection 10 mL  10 mL Intravenous PRN Berenzon, Dmitriy, MD      . Tbo-Filgrastim (GRANIX) injection 480 mcg  480 mcg Subcutaneous Once Cammie Sickle, MD        PHYSICAL EXAMINATION: ECOG PERFORMANCE STATUS: 1 - Symptomatic but completely ambulatory  BP 120/80 (BP Location: Left Arm, Patient Position: Sitting)   Pulse (!) 121   Temp (!) 97.3 F  (36.3 C) (Tympanic)   Resp 16   Wt 161 lb 14.4 oz (73.4 kg)   BMI 26.13 kg/m   Filed Weights   07/08/18 1359  Weight: 161 lb 14.4 oz (73.4 kg)    Physical Exam  Constitutional: She is oriented to person, place, and time and well-developed, well-nourished, and in no distress.  Alone.  Walking herself.  HENT:  Head: Normocephalic and atraumatic.  Mouth/Throat: Oropharynx is clear and moist. No oropharyngeal exudate.  Eyes: Pupils are equal, round, and reactive to light.  Neck: Normal range of motion. Neck supple.  Cardiovascular: Normal rate and regular rhythm.  Pulmonary/Chest: No respiratory distress.  She has no wheezes.  Abdominal: Soft. Bowel sounds are normal. She exhibits no distension and no mass. There is no tenderness. There is no rebound and no guarding.  Musculoskeletal: Normal range of motion. She exhibits no edema or tenderness.  Neurological: She is alert and oriented to person, place, and time.  Skin: Skin is warm.  Significant hyperpigmentation of the bilateral hand and feet.  No desquamation.  Psychiatric: Affect normal.       LABORATORY DATA:  I have reviewed the data as listed    Component Value Date/Time   NA 139 07/08/2018 1126   NA 135 03/04/2017 0918   NA 135 01/08/2015 0850   K 4.4 07/08/2018 1126   K 3.7 01/08/2015 0850   CL 104 07/08/2018 1126   CL 101 01/08/2015 0850   CO2 27 07/08/2018 1126   CO2 26 01/08/2015 0850   GLUCOSE 91 07/08/2018 1126   GLUCOSE 161 (H) 01/08/2015 0850   BUN 20 07/08/2018 1126   BUN 8 03/04/2017 0918   BUN 17 01/08/2015 0850   CREATININE 0.87 07/08/2018 1126   CREATININE 0.82 01/22/2015 1559   CALCIUM 9.6 07/08/2018 1126   CALCIUM 9.3 01/08/2015 0850   PROT 7.5 07/08/2018 1126   PROT 6.4 03/04/2017 0918   PROT 6.8 01/22/2015 1559   ALBUMIN 4.1 07/08/2018 1126   ALBUMIN 2.5 (L) 03/04/2017 0918   ALBUMIN 3.9 01/22/2015 1559   AST 34 07/08/2018 1126   AST 34 01/22/2015 1559   ALT 21 07/08/2018 1126   ALT  42 01/22/2015 1559   ALKPHOS 98 07/08/2018 1126   ALKPHOS 157 (H) 01/22/2015 1559   BILITOT 1.4 (H) 07/08/2018 1126   BILITOT 6.2 (H) 03/04/2017 0918   BILITOT 0.2 (L) 01/22/2015 1559   GFRNONAA >60 07/08/2018 1126   GFRNONAA >60 01/22/2015 1559   GFRAA >60 07/08/2018 1126   GFRAA >60 01/22/2015 1559    No results found for: SPEP, UPEP  Lab Results  Component Value Date   WBC 2.7 (L) 07/08/2018   NEUTROABS 1.5 (L) 07/08/2018   HGB 14.0 07/08/2018   HCT 42.0 07/08/2018   MCV 99.1 07/08/2018   PLT 161 07/08/2018      Chemistry      Component Value Date/Time   NA 139 07/08/2018 1126   NA 135 03/04/2017 0918   NA 135 01/08/2015 0850   K 4.4 07/08/2018 1126   K 3.7 01/08/2015 0850   CL 104 07/08/2018 1126   CL 101 01/08/2015 0850   CO2 27 07/08/2018 1126   CO2 26 01/08/2015 0850   BUN 20 07/08/2018 1126   BUN 8 03/04/2017 0918   BUN 17 01/08/2015 0850   CREATININE 0.87 07/08/2018 1126   CREATININE 0.82 01/22/2015 1559      Component Value Date/Time   CALCIUM 9.6 07/08/2018 1126   CALCIUM 9.3 01/08/2015 0850   ALKPHOS 98 07/08/2018 1126   ALKPHOS 157 (H) 01/22/2015 1559   AST 34 07/08/2018 1126   AST 34 01/22/2015 1559   ALT 21 07/08/2018 1126   ALT 42 01/22/2015 1559   BILITOT 1.4 (H) 07/08/2018 1126   BILITOT 6.2 (H) 03/04/2017 0918   BILITOT 0.2 (L) 01/22/2015 1559       RADIOGRAPHIC STUDIES: I have personally reviewed the radiological images as listed and agreed with the findings in the report. No results found.   ASSESSMENT & PLAN:  Carcinoma of upper-outer quadrant of left breast in female, estrogen receptor positive ([place]) # Recurrent metastatic breast  cancer-ER PR positive HER-2 negative;   On xeloda- CT AUG 21st 2019- liver lesions-STABLE.   # Continue xeloda 2 pills in AM & 2 pills in PN; 1 w-On and 1 w OFF. Labs today reviewed. Will repeat CT scan in Nov 2019.   # hand foot syndrome-/hyperpigmentation of hand and feet- G-1 continue kenalog; urea  20%; vaseline bid. STABLE.   # Brain metastases-recurrent/likely leptomeningeal disease;  S/p WBRT [April 2019]. July MRI- improving; STABLE. Will repeat scan in Nov 2019.   #Peripheral neuropathy grade 1-  on Lyrica;STABLE.   # DISPOSITION:  Follow up in 3 week-MD/labs-[ cbc/cmp/ca15-3/ca-27-29]- Dr.B    Orders Placed This Encounter  Procedures  . CBC with Differential/Platelet    Standing Status:   Future    Standing Expiration Date:   07/09/2019  . Cancer antigen 15-3    Standing Status:   Future    Standing Expiration Date:   07/09/2019  . Cancer antigen 27.29    Standing Status:   Future    Standing Expiration Date:   07/09/2019  . Comprehensive metabolic panel    Standing Status:   Future    Standing Expiration Date:   07/09/2019   All questions were answered. The patient knows to call the clinic with any problems, questions or concerns.      Cammie Sickle, MD 07/08/2018 3:59 PM

## 2018-07-08 NOTE — Assessment & Plan Note (Addendum)
#  Recurrent metastatic breast cancer-ER PR positive HER-2 negative;   On xeloda- CT AUG 21st 2019- liver lesions-STABLE.   # Continue xeloda 2 pills in AM & 2 pills in PN; 1 w-On and 1 w OFF. Labs today reviewed. Will repeat CT scan in Nov 2019.   # hand foot syndrome-/hyperpigmentation of hand and feet- G-1 continue kenalog; urea 20%; vaseline bid. STABLE.   # Brain metastases-recurrent/likely leptomeningeal disease;  S/p WBRT [April 2019]. July MRI- improving; STABLE. Will repeat scan in Nov 2019.   #Peripheral neuropathy grade 1-  on Lyrica;STABLE.   # DISPOSITION:  Follow up in 3 week-MD/labs-[ cbc/cmp/ca15-3/ca-27-29]- Dr.B

## 2018-07-11 LAB — CANCER ANTIGEN 27.29: CAN 27.29: 16.3 U/mL (ref 0.0–38.6)

## 2018-07-29 ENCOUNTER — Encounter: Payer: Self-pay | Admitting: Internal Medicine

## 2018-07-29 ENCOUNTER — Inpatient Hospital Stay: Payer: BLUE CROSS/BLUE SHIELD | Attending: Internal Medicine

## 2018-07-29 ENCOUNTER — Other Ambulatory Visit: Payer: Self-pay | Admitting: Internal Medicine

## 2018-07-29 ENCOUNTER — Inpatient Hospital Stay (HOSPITAL_BASED_OUTPATIENT_CLINIC_OR_DEPARTMENT_OTHER): Payer: BLUE CROSS/BLUE SHIELD | Admitting: Internal Medicine

## 2018-07-29 VITALS — BP 130/85 | HR 105 | Temp 98.2°F | Resp 18 | Wt 162.1 lb

## 2018-07-29 DIAGNOSIS — C7949 Secondary malignant neoplasm of other parts of nervous system: Secondary | ICD-10-CM

## 2018-07-29 DIAGNOSIS — C50412 Malignant neoplasm of upper-outer quadrant of left female breast: Secondary | ICD-10-CM | POA: Diagnosis not present

## 2018-07-29 DIAGNOSIS — T451X5D Adverse effect of antineoplastic and immunosuppressive drugs, subsequent encounter: Secondary | ICD-10-CM | POA: Diagnosis not present

## 2018-07-29 DIAGNOSIS — L271 Localized skin eruption due to drugs and medicaments taken internally: Secondary | ICD-10-CM | POA: Insufficient documentation

## 2018-07-29 DIAGNOSIS — Z7902 Long term (current) use of antithrombotics/antiplatelets: Secondary | ICD-10-CM | POA: Diagnosis not present

## 2018-07-29 DIAGNOSIS — M8580 Other specified disorders of bone density and structure, unspecified site: Secondary | ICD-10-CM | POA: Diagnosis not present

## 2018-07-29 DIAGNOSIS — C7931 Secondary malignant neoplasm of brain: Secondary | ICD-10-CM

## 2018-07-29 DIAGNOSIS — Z79899 Other long term (current) drug therapy: Secondary | ICD-10-CM

## 2018-07-29 DIAGNOSIS — F17210 Nicotine dependence, cigarettes, uncomplicated: Secondary | ICD-10-CM | POA: Insufficient documentation

## 2018-07-29 DIAGNOSIS — Z170 Estrogen receptor positive status [ER+]: Secondary | ICD-10-CM | POA: Insufficient documentation

## 2018-07-29 DIAGNOSIS — C787 Secondary malignant neoplasm of liver and intrahepatic bile duct: Secondary | ICD-10-CM

## 2018-07-29 DIAGNOSIS — G629 Polyneuropathy, unspecified: Secondary | ICD-10-CM | POA: Insufficient documentation

## 2018-07-29 LAB — COMPREHENSIVE METABOLIC PANEL
ALBUMIN: 4.1 g/dL (ref 3.5–5.0)
ALT: 27 U/L (ref 0–44)
ANION GAP: 8 (ref 5–15)
AST: 35 U/L (ref 15–41)
Alkaline Phosphatase: 113 U/L (ref 38–126)
BILIRUBIN TOTAL: 0.9 mg/dL (ref 0.3–1.2)
BUN: 21 mg/dL (ref 8–23)
CHLORIDE: 104 mmol/L (ref 98–111)
CO2: 25 mmol/L (ref 22–32)
Calcium: 9.4 mg/dL (ref 8.9–10.3)
Creatinine, Ser: 0.88 mg/dL (ref 0.44–1.00)
GFR calc Af Amer: >60 mL/min (ref >60)
GLUCOSE: 109 mg/dL — AB (ref 70–99)
POTASSIUM: 4 mmol/L (ref 3.5–5.1)
Sodium: 137 mmol/L (ref 135–145)
TOTAL PROTEIN: 7.7 g/dL (ref 6.5–8.1)

## 2018-07-29 LAB — CBC WITH DIFFERENTIAL/PLATELET
ABS IMMATURE GRANULOCYTES: 0 10*3/uL (ref 0.00–0.07)
BASOS PCT: 0 %
Basophils Absolute: 0 10*3/uL (ref 0.0–0.1)
Eosinophils Absolute: 0.1 10*3/uL (ref 0.0–0.5)
Eosinophils Relative: 2 %
HCT: 40.1 % (ref 36.0–46.0)
HEMOGLOBIN: 13.4 g/dL (ref 12.0–15.0)
IMMATURE GRANULOCYTES: 0 %
LYMPHS PCT: 25 %
Lymphs Abs: 0.9 10*3/uL (ref 0.7–4.0)
MCH: 33.2 pg (ref 26.0–34.0)
MCHC: 33.4 g/dL (ref 30.0–36.0)
MCV: 99.3 fL (ref 80.0–100.0)
Monocytes Absolute: 0.3 10*3/uL (ref 0.1–1.0)
Monocytes Relative: 9 %
NEUTROS ABS: 2.3 10*3/uL (ref 1.7–7.7)
NEUTROS PCT: 64 %
PLATELETS: 168 10*3/uL (ref 150–400)
RBC: 4.04 MIL/uL (ref 3.87–5.11)
RDW: 17.3 % — ABNORMAL HIGH (ref 11.5–15.5)
WBC: 3.6 10*3/uL — AB (ref 4.0–10.5)
nRBC: 0 % (ref 0.0–0.2)

## 2018-07-29 NOTE — Progress Notes (Signed)
Pt in for follow up, denies any difficulties or concerns.

## 2018-07-29 NOTE — Progress Notes (Signed)
[facility] OFFICE PROGRESS NOTE  Patient Care Team: Steele Sizer, MD as PCP - General (Family Medicine) Wellington Hampshire, MD as PCP - Cardiology (Cardiology) Bary Castilla, Forest Gleason, MD (General Surgery) [name], MD (Inactive) as Attending Physician (Internal Medicine)  Cancer Staging No matching staging information was found for the patient.   Oncology History   # LEFT BREAST IDC; STAGE II [cT2N1] ER >90%; PR- 50-90%; her 2 NEU- POS; s/p Neoadj chemo; AUG 2016- TCH+P s/p Lumpec & partial ALND- path CR; s/p RT [finished Nov 2016];  adj Herceptin; HELD for Jan 19th 2017 [in DEC 2016-EF dropped from 63 to 51%; FEB 27th EF-42.9%]; JAN 2016 START Arimidex; May 2017- EF- 60%; May 24th 2017-Re-start Herceptin q 3W; July 28th STOP herceptin [finished 91m/m2; sec to Low EF; however 2017 Oct EF= 67%; improved]  # DEC 4th 2017- Start Neratinib 4 pills; DEC 11th 3 pills; STOPPED.  # FEB 2018- RECURRENCE ER/PR positive; Her 2 NEGATIVE [liver Bx]  # MARCH 1st 2018- Tax-Cytoxan [poor tol]  # FEB 2018- Brain mets [SBRT; Dr.Crystal; finished Feb 28th 2018]  # March 2018- Eribulin- poor tolerance  # June 8th 2018- Started Faslodex + Abema [abema-multiple interruptions sec to neutropenia]  # MRI Brain- March 2019- Leptomeningeal mets [WBRT; No LP s/p WBRT- 01/09/2018]; STOP Abema [584mday-sec to severe cytopenia]+faslodex  # MAY 20tth 019- Start Xeloda    # PN- G-2; may 2017- Cymbalta 6073m;MARCH 2018-  acute vascular insuff of BIL LE [? Taxotere vasoconstriction on asprin]; # hemorragic shock LGIB [s/p colo; Dr.Wohl]  #  SVT- on flecainide.   # April 2016- Liver Bx- NEG;   # Drop in EF from Herceptin [recovered OCt 27th- EF 65%]  # BMD- jan 2017- osteopenia  -----------------------------------------------------------     [place]- Sep 4th 2018- PDL-1 0%/NEG for BRCA; MSI-Stable; Positive for PI3K; ccnd-1;  FGF** -----------------------------------------------------------  Dx: Metastatic Breast cancer [ER/PR-Pos; her -2 NEG] Stage IV; goals: palliative Current treatment- Xeloda [may 20th 2019]     Carcinoma of upper-outer quadrant of left breast in female, estrogen receptor positive ([place]    INTERVAL HISTORY:  Sarah Horne 69o.  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  Patient continues to complain of significant dark rash on her palms and soles.  Denies any pain.  Denies any peeling of the skin.   No sores in the mouth.  No nausea no vomiting.  Chronic mild tingling and numbness in the extremities.  Review of Systems  Constitutional: Positive for malaise/fatigue. Negative for chills, diaphoresis, fever and weight loss.  HENT: Negative for nosebleeds and sore throat.   Eyes: Negative for double vision.  Respiratory: Negative for cough, hemoptysis, sputum production, shortness of breath and wheezing.   Cardiovascular: Negative for chest pain, palpitations, orthopnea and leg swelling.  Gastrointestinal: Negative for abdominal pain, blood in stool, constipation, diarrhea, heartburn, melena, nausea and vomiting.  Genitourinary: Negative for dysuria, frequency and urgency.  Musculoskeletal: Positive for joint pain. Negative for back pain.  Skin: Positive for rash. Negative for itching.  Neurological: Positive for tingling. Negative for dizziness, focal weakness, weakness and headaches.  Endo/Heme/Allergies: Does not bruise/bleed easily.  Psychiatric/Behavioral: Negative for depression. The patient is not nervous/anxious and does not have insomnia.       PAST MEDICAL HISTORY :  Past Medical History:  Diagnosis Date  . Chemotherapy-induced peripheral neuropathy (  [place])   . Epistaxis    a. 11/2016 in setting of asa/plavix-->silver nitrate cauterization.  .  GI bleed    a. 11/2016 Admission w/ GIB and hypovolemic shock req 3u PRBC's;  b. 11/2016 ECG: gastritis & nonbleeding peptic ulcer; c. 11/2016 Conlonoscopy: rectal and sigmoid colonic ulcers.  . Heart attack ([place])    a. 1998 Cath @ [facility]: reportedly no intervention required.  Marland Kitchen Herceptin-induced cardiomyopathy ([place])    a. In the setting of Herceptin Rx for breast cancer (initiated 12/2014); b. 03/2015 MUGA EF 64%; b. 08/2015 MUGA: EF 51%; c. 10/2015 MUGA: EF 44%; d. 11/2015 Echo: EF 45-50%; e. 01/2016 MUGA: EF 60%; f. 06/2016 MUGA EF 65%; g. 10/2016 MUGA: EF 61%;  h. 12/2016 Echo: EF 55-60%, gr1 DD.  Marland Kitchen Hyperlipidemia   . Hypertension   . Neuropathy   . Possible PAD (peripheral artery disease) ([place])    a. 11/2016 LE cyanosis and weak pulses-->CTA w/o significant Ao-BiFem dzs. ? distal dzs-->ASA/Plavix initiated by vascular surgery.  Marland Kitchen PSVT (paroxysmal supraventricular tachycardia) ([place])    a. Dx 11/2016.  . Pulmonary embolism ([place])    a. 12/2016 CTA Chest: small nonocclusive PE in inferior segment of the Left lingula, somewhat eccentric filling defect suggesting chronic rather than acute embolic event; b. 04/5461 LE U/S:  No DVT; c. 12/2016 Echo: Nl RV fxn, nl PASP.  Marland Kitchen Recurrent Metastatic breast cancer ([place])    a. Dx 2016: Stage II, ER positive, PR positive, HER-2/neu overexpressing of the left breast-->chemo/radiation; b. 10/2016 CT Abd/pelvis: diffuse liver mets, ill defined sclerotic bone lesions-T12;  c. 10/2016 MRI brain: metastatic lesion along L temporal lobe (19x70m) w/ extensive surrounding edema & 558mmidline shift to right.  . Sinus tachycardia     PAST SURGICAL HISTORY :   Past Surgical History:  Procedure Laterality Date  . BREAST BIOPSY Left 2016   Positive  . BREAST LUMPECTOMY WITH SENTINEL LYMPH NODE BIOPSY Left 05/23/2015   Procedure: LEFT BREAST WIDE EXCISION WITH AXILLARY DISSECTION, MASTOPLASTY ;  Surgeon: JeRobert BellowMD;  Location: [facility] ORS;  Service: General;  Laterality: Left;   . BREAST SURGERY Left 12/18/14   breast biopsy/INVASIVE DUCTAL CARCINOMA OF BREAST, NOTTINGHAM GRADE 2.  . Marland KitchenREAST SURGERY  05/23/2015.   Wide excision/mastoplasty, axillary dissection. No residual invasive cancer, positive for residual DCIS. 0/2 nodes identified on axillary dissection. (no SLN by technetium or methylene blue)  . CARDIAC CATHETERIZATION    . COLONOSCOPY WITH PROPOFOL N/A 12/10/2016   Procedure: COLONOSCOPY WITH PROPOFOL;  Surgeon: DaLucilla LameMD;  Location: [facility] ENDOSCOPY;  Service: Endoscopy;  Laterality: N/A;  . COLONOSCOPY WITH PROPOFOL N/A 02/13/2017   Procedure: COLONOSCOPY WITH PROPOFOL;  Surgeon: WoLucilla LameMD;  [facility];  Service: Endoscopy;  Laterality: N/A;  . ESOPHAGOGASTRODUODENOSCOPY (EGD) WITH PROPOFOL N/A 12/08/2016   Procedure: ESOPHAGOGASTRODUODENOSCOPY (EGD) WITH PROPOFOL;  Surgeon: DaLucilla LameMD;  Location: [facility] ENDOSCOPY;  Service: Endoscopy;  Laterality: N/A;  . IVC FILTER INSERTION N/A 02/15/2017   Procedure: IVC Filter Insertion;  Surgeon: DeAlgernon HuxleyMD;  [facility];  Service: Cardiovascular;  Laterality: N/A;  . PORTACATH PLACEMENT Right 12-31-14   Dr ByBary Castilla  FAMILY HISTORY :   Family History  Problem Relation Age of Onset  . Breast cancer Maternal Aunt   . Breast cancer Cousin   . Brain cancer Maternal Uncle   . Diabetes Mother   . Hypertension Mother   . Stroke Mother  SOCIAL HISTORY:   Social History   Tobacco Use  . Smoking status: Light Tobacco Smoker    Packs/day: 0.50    Years: 18.00    Pack years: 9.00    Types: Cigarettes  . Smokeless tobacco: Never Used  Substance Use Topics  . Alcohol use: No    Alcohol/week: 0.0 standard drinks  . Drug use: No    ALLERGIES:  is allergic to no known allergies.  MEDICATIONS:  Current Outpatient Medications  Medication Sig Dispense Refill  . capecitabine (XELODA) 500 MG tablet TAKE 4 TABLETS BY MOUTH TWICE DAILY AFTER A MEAL. TAKE FOR 1 WEEK  ON THEN 1 WEEK OFF 112 tablet 0  . flecainide (TAMBOCOR) 50 MG tablet Take 1 tablet (50 mg total) by mouth 2 (two) times daily. 60 tablet 11  . levocetirizine (XYZAL) 5 MG tablet Take 1 tablet (5 mg total) by mouth every evening. 30 tablet 3  . LYRICA 75 MG capsule TAKE ONE CAPSULE BY MOUTH TWICE A DAY 60 capsule 4  . magnesium oxide (MAG-OX) 400 MG tablet TAKE 1 TABLET (400 MG TOTAL) BY MOUTH 2 (TWO) TIMES DAILY. 60 tablet 6  . potassium chloride (KLOR-CON) 20 MEQ packet Take by mouth 2 (two) times daily.    Marland Kitchen triamcinolone ointment (KENALOG) 0.5 % Apply 1 application topically 2 (two) times daily. (Patient taking differently: Apply 1 application topically as needed. ) 30 g 2  . acetic acid-hydrocortisone (VOSOL-HC) OTIC solution Place 2 drops into both ears daily as needed. 10 mL 0  . capecitabine (XELODA) 500 MG tablet TAKE 4 TABLETS BY MOUTH TWICE DAILY AFTER A MEAL. TAKE FOR 1 WEEK ON THEN 1 WEEK OFF 112 tablet PRN  . Vitamin D, Ergocalciferol, (DRISDOL) 50000 units CAPS capsule TAKE 1 CAPSULE (50,000 UNITS TOTAL) ONCE A WEEK BY MOUTH. 12 capsule 1   No current facility-administered medications for this visit.    Facility-Administered Medications Ordered in Other Visits  Medication Dose Route Frequency Provider Last Rate Last Dose  . 0.9 %  sodium chloride infusion   Intravenous Continuous Pandit, Sandeep, MD      . 0.9 %  sodium chloride infusion   Intravenous Continuous Lloyd Huger, MD 999 mL/hr at 04/24/15 1520    . heparin lock flush 100 unit/mL  500 Units Intravenous Once Berenzon, Dmitriy, MD      . sodium chloride 0.9 % injection 10 mL  10 mL Intravenous PRN [name], MD   10 mL at 04/04/15 1440  . sodium chloride flush (NS) 0.9 % injection 10 mL  10 mL Intravenous PRN Berenzon, Dmitriy, MD      . Tbo-Filgrastim (GRANIX) injection 480 mcg  480 mcg Subcutaneous Once Cammie Sickle, MD        PHYSICAL EXAMINATION: ECOG PERFORMANCE STATUS: 1 - Symptomatic but  completely ambulatory  BP 130/85 (BP Location: Right Arm, Patient Position: Sitting)   Pulse (!) 105   Temp 98.2 F (36.8 C) (Oral)   Resp 18   Wt 162 lb 2 oz (73.5 kg)   BMI 26.17 kg/m   Filed Weights   07/29/18 1404  Weight: 162 lb 2 oz (73.5 kg)    Physical Exam  Constitutional: She is oriented to person, place, and time and well-developed, well-nourished, and in no distress.  Alone.  Walking herself.  HENT:  Head: Normocephalic and atraumatic.  Mouth/Throat: Oropharynx is clear and moist. No oropharyngeal exudate.  Eyes: Pupils are equal, round, and reactive to light.  Neck:  Normal range of motion. Neck supple.  Cardiovascular: Normal rate and regular rhythm.  Pulmonary/Chest: No respiratory distress. She has no wheezes.  Abdominal: Soft. Bowel sounds are normal. She exhibits no distension and no mass. There is no tenderness. There is no rebound and no guarding.  Musculoskeletal: Normal range of motion. She exhibits no edema or tenderness.  Neurological: She is alert and oriented to person, place, and time.  Skin: Skin is warm.  Significant hyperpigmentation of the bilateral hand and feet.  No desquamation.  Psychiatric: Affect normal.       LABORATORY DATA:  I have reviewed the data as listed    Component Value Date/Time   NA 137 07/29/2018 1336   NA 135 03/04/2017 0918   NA 135 01/08/2015 0850   K 4.0 07/29/2018 1336   K 3.7 01/08/2015 0850   CL 104 07/29/2018 1336   CL 101 01/08/2015 0850   CO2 25 07/29/2018 1336   CO2 26 01/08/2015 0850   GLUCOSE 109 (H) 07/29/2018 1336   GLUCOSE 161 (H) 01/08/2015 0850   BUN 21 07/29/2018 1336   BUN 8 03/04/2017 0918   BUN 17 01/08/2015 0850   CREATININE 0.88 07/29/2018 1336   CREATININE 0.82 01/22/2015 1559   CALCIUM 9.4 07/29/2018 1336   CALCIUM 9.3 01/08/2015 0850   PROT 7.7 07/29/2018 1336   PROT 6.4 03/04/2017 0918   PROT 6.8 01/22/2015 1559   ALBUMIN 4.1 07/29/2018 1336   ALBUMIN 2.5 (L) 03/04/2017 0918    ALBUMIN 3.9 01/22/2015 1559   AST 35 07/29/2018 1336   AST 34 01/22/2015 1559   ALT 27 07/29/2018 1336   ALT 42 01/22/2015 1559   ALKPHOS 113 07/29/2018 1336   ALKPHOS 157 (H) 01/22/2015 1559   BILITOT 0.9 07/29/2018 1336   BILITOT 6.2 (H) 03/04/2017 0918   BILITOT 0.2 (L) 01/22/2015 1559   GFRNONAA >60 07/29/2018 1336   GFRNONAA >60 01/22/2015 1559   GFRAA >60 07/29/2018 1336   GFRAA >60 01/22/2015 1559    No results found for: SPEP, UPEP  Lab Results  Component Value Date   WBC 3.6 (L) 07/29/2018   NEUTROABS 2.3 07/29/2018   HGB 13.4 07/29/2018   HCT 40.1 07/29/2018   MCV 99.3 07/29/2018   PLT 168 07/29/2018      Chemistry      Component Value Date/Time   NA 137 07/29/2018 1336   NA 135 03/04/2017 0918   NA 135 01/08/2015 0850   K 4.0 07/29/2018 1336   K 3.7 01/08/2015 0850   CL 104 07/29/2018 1336   CL 101 01/08/2015 0850   CO2 25 07/29/2018 1336   CO2 26 01/08/2015 0850   BUN 21 07/29/2018 1336   BUN 8 03/04/2017 0918   BUN 17 01/08/2015 0850   CREATININE 0.88 07/29/2018 1336   CREATININE 0.82 01/22/2015 1559      Component Value Date/Time   CALCIUM 9.4 07/29/2018 1336   CALCIUM 9.3 01/08/2015 0850   ALKPHOS 113 07/29/2018 1336   ALKPHOS 157 (H) 01/22/2015 1559   AST 35 07/29/2018 1336   AST 34 01/22/2015 1559   ALT 27 07/29/2018 1336   ALT 42 01/22/2015 1559   BILITOT 0.9 07/29/2018 1336   BILITOT 6.2 (H) 03/04/2017 0918   BILITOT 0.2 (L) 01/22/2015 1559       RADIOGRAPHIC STUDIES: I have personally reviewed the radiological images as listed and agreed with the findings in the report. No results found.   ASSESSMENT & PLAN:  Carcinoma  of upper-outer quadrant of left breast in female, estrogen receptor positive ([place]) # Recurrent metastatic breast cancer-ER PR positive HER-2 negative;   On xeloda- CT AUG 21st 2019- liver lesions-STABLE.   # Continue xeloda 2 pills in AM & 2 pills in PN; 1 w-On and 1 w OFF. Labs today reviewed. Ab/pelvis CT scan  ordered today.   # hand foot syndrome-/hyperpigmentation of hand and feet- G-1 continue kenalog; urea 20%; vaseline bid. STABLE.   # Brain metastases-recurrent/likely leptomeningeal disease;  S/p WBRT [April 2019]. July MRI- improving; STABLE. Ordered MRI brain today.   #Peripheral neuropathy grade 1-  on Lyrica; STABLE.   # DISPOSITION:  # Follow up in 3 week-MD/labs-[ cbc/cmp/ca15-3/ca-27-29]- CTA/P; MRI brain prior- Dr.B    Orders Placed This Encounter  Procedures  . CT Abdomen Pelvis W Contrast    Standing Status:   Future    Standing Expiration Date:   07/29/2019    Order Specific Question:   ** REASON FOR EXAM (FREE TEXT)    Answer:   metastatic breast cancer on chemo    Order Specific Question:   If indicated for the ordered procedure, I authorize the administration of contrast media per Radiology protocol    Answer:   Yes    Order Specific Question:   Preferred imaging location?    Answer:   [facility]    Order Specific Question:   Is Oral Contrast requested for this exam?    Answer:   Yes, Per Radiology protocol    Order Specific Question:   Radiology Contrast Protocol - do NOT remove file path    Answer:   \\[email or web address]  . MR Brain W Wo Contrast    Standing Status:   Future    Standing Expiration Date:   07/29/2019    Order Specific Question:   ** REASON FOR EXAM (FREE TEXT)    Answer:   breast cancer mets to liver.    Order Specific Question:   If indicated for the ordered procedure, I authorize the administration of contrast media per Radiology protocol    Answer:   Yes    Order Specific Question:   What is the patient's sedation requirement?    Answer:   No Sedation    Order Specific Question:   Does the patient have a pacemaker or implanted devices?    Answer:   No    Order Specific Question:   Use SRS Protocol?    Answer:   No    Order Specific Question:   Radiology Contrast Protocol - do NOT remove file path    Answer:    [email or web address]    Order Specific Question:   Preferred imaging location?    Answer:   [facility] (table limit-300lbs)  . CBC with Differential/Platelet    Standing Status:   Future    Standing Expiration Date:   07/30/2019  . Comprehensive metabolic panel    Standing Status:   Future    Standing Expiration Date:   07/30/2019  . Cancer antigen 27.29    Standing Status:   Future    Standing Expiration Date:   07/29/2019   All questions were answered. The patient knows to call the clinic with any problems, questions or concerns.      Cammie Sickle, MD 08/07/2018 5:55 PM

## 2018-07-29 NOTE — Assessment & Plan Note (Addendum)
#  Recurrent metastatic breast cancer-ER PR positive HER-2 negative;   On xeloda- CT AUG 21st 2019- liver lesions-STABLE.   # Continue xeloda 2 pills in AM & 2 pills in PN; 1 w-On and 1 w OFF. Labs today reviewed. Ab/pelvis CT scan ordered today.   # hand foot syndrome-/hyperpigmentation of hand and feet- G-1 continue kenalog; urea 20%; vaseline bid. STABLE.   # Brain metastases-recurrent/likely leptomeningeal disease;  S/p WBRT [April 2019]. July MRI- improving; STABLE. Ordered MRI brain today.   #Peripheral neuropathy grade 1-  on Lyrica; STABLE.   # DISPOSITION:  # Follow up in 3 week-MD/labs-[ cbc/cmp/ca15-3/ca-27-29]- CTA/P; MRI brain prior- Dr.B

## 2018-07-30 LAB — CANCER ANTIGEN 27.29: CA 27.29: 18.7 U/mL (ref 0.0–38.6)

## 2018-07-30 LAB — CANCER ANTIGEN 15-3: CA 15-3: 14.2 U/mL (ref 0.0–25.0)

## 2018-08-05 ENCOUNTER — Ambulatory Visit (INDEPENDENT_AMBULATORY_CARE_PROVIDER_SITE_OTHER): Payer: BLUE CROSS/BLUE SHIELD | Admitting: Nurse Practitioner

## 2018-08-05 ENCOUNTER — Encounter: Payer: Self-pay | Admitting: Nurse Practitioner

## 2018-08-05 ENCOUNTER — Other Ambulatory Visit: Payer: Self-pay | Admitting: Internal Medicine

## 2018-08-05 VITALS — BP 120/70 | HR 103 | Ht 66.0 in | Wt 163.2 lb

## 2018-08-05 DIAGNOSIS — I427 Cardiomyopathy due to drug and external agent: Secondary | ICD-10-CM | POA: Diagnosis not present

## 2018-08-05 DIAGNOSIS — Z170 Estrogen receptor positive status [ER+]: Principal | ICD-10-CM

## 2018-08-05 DIAGNOSIS — I471 Supraventricular tachycardia: Secondary | ICD-10-CM

## 2018-08-05 DIAGNOSIS — I10 Essential (primary) hypertension: Secondary | ICD-10-CM

## 2018-08-05 DIAGNOSIS — C50412 Malignant neoplasm of upper-outer quadrant of left female breast: Secondary | ICD-10-CM

## 2018-08-05 DIAGNOSIS — T451X5A Adverse effect of antineoplastic and immunosuppressive drugs, initial encounter: Secondary | ICD-10-CM | POA: Diagnosis not present

## 2018-08-05 NOTE — Patient Instructions (Signed)
Medication Instructions:  Your physician recommends that you continue on your current medications as directed. Please refer to the Current Medication list given to you today.  If you need a refill on your cardiac medications before your next appointment, please call your pharmacy.   Lab work: No lab work is ordered today.   If you have labs (blood work) drawn today and your tests are completely normal, you will receive your results only by: Marland Kitchen MyChart Message (if you have MyChart) OR . A paper copy in the mail If you have any lab test that is abnormal or we need to change your treatment, we will call you to review the results.  Testing/Procedures: No tests ordered today.   Follow-Up: At [facility], you and your health needs are our priority.  As part of our continuing mission to provide you with exceptional heart care, we have created designated Provider Care Teams.  These Care Teams include your primary Cardiologist (physician) and Advanced Practice Providers (APPs -  Physician Assistants and Nurse Practitioners) who all work together to provide you with the care you need, when you need it. You will need a follow up appointment in 6 months.  Please call our office 2 months in advance to schedule this appointment.  You may see Kathlyn [place], MD or one of the following Advanced Practice Providers on your designated Care Team:   Murray Hodgkins, NP Christell Faith, PA-C . Marrianne Mood, PA-C  Any Other Special Instructions Will Be Listed Below (If Applicable). None

## 2018-08-05 NOTE — Progress Notes (Signed)
Office Visit    Patient Name: Sarah Horne Date of Encounter: 08/05/2018  Primary Care Provider:  Steele Sizer, MD Primary Cardiologist:  Kathlyn Sacramento, MD  Chief Complaint    Sarah Horne is a 62 year old female with a history of PSVT, CAD with MI in 1998 , Breast Ca, Chemo-induced cardiomyopathy, hypertension, hyperlipidemia, and neuropathy. Seen today for follow up of PSVT.  Past Medical History    Past Medical History:  Diagnosis Date  . Chemotherapy-induced peripheral neuropathy ([place])   . Epistaxis    a. 11/2016 in setting of asa/plavix-->silver nitrate cauterization.  . GI bleed    a. 11/2016 Admission w/ GIB and hypovolemic shock req 3u PRBC's;  b. 11/2016 ECG: gastritis & nonbleeding peptic ulcer; c. 11/2016 Conlonoscopy: rectal and sigmoid colonic ulcers.  . Heart attack ([place])    a. 1998 Cath @ [facility]: reportedly no intervention required.  Marland Kitchen Herceptin-induced cardiomyopathy ([place])    a. In the setting of Herceptin Rx for breast cancer (initiated 12/2014); b. 03/2015 MUGA EF 64%; b. 08/2015 MUGA: EF 51%; c. 10/2015 MUGA: EF 44%; d. 11/2015 Echo: EF 45-50%; e. 01/2016 MUGA: EF 60%; f. 06/2016 MUGA EF 65%; g. 10/2016 MUGA: EF 61%;  h. 12/2016 Echo: EF 55-60%, gr1 DD.  Marland Kitchen Hyperlipidemia   . Hypertension   . Neuropathy   . Possible PAD (peripheral artery disease) ([place])    a. 11/2016 LE cyanosis and weak pulses-->CTA w/o significant Ao-BiFem dzs. ? distal dzs-->ASA/Plavix initiated by vascular surgery.  Marland Kitchen PSVT (paroxysmal supraventricular tachycardia) ([place])    a. Dx 11/2016.  . Pulmonary embolism ([place])    a. 12/2016 CTA Chest: small nonocclusive PE in inferior segment of the Left lingula, somewhat eccentric filling defect suggesting chronic rather than acute embolic event; b. 04/1447 LE U/S:  No DVT; c. 12/2016 Echo: Nl RV fxn, nl PASP.  Marland Kitchen Recurrent Metastatic breast cancer ([place])    a. Dx 2016: Stage II, ER positive, PR positive, HER-2/neu overexpressing of the left  breast-->chemo/radiation; b. 10/2016 CT Abd/pelvis: diffuse liver mets, ill defined sclerotic bone lesions-T12;  c. 10/2016 MRI brain: metastatic lesion along L temporal lobe (19x56m) w/ extensive surrounding edema & 522mmidline shift to right.  . Sinus tachycardia    Past Surgical History:  Procedure Laterality Date  . BREAST BIOPSY Left 2016   Positive  . BREAST LUMPECTOMY WITH SENTINEL LYMPH NODE BIOPSY Left 05/23/2015   Procedure: LEFT BREAST WIDE EXCISION WITH AXILLARY DISSECTION, MASTOPLASTY ;  Surgeon: JeRobert BellowMD;  Location: [facility] ORS;  Service: General;  Laterality: Left;  . BREAST SURGERY Left 12/18/14   breast biopsy/INVASIVE DUCTAL CARCINOMA OF BREAST, NOTTINGHAM GRADE 2.  . Marland KitchenREAST SURGERY  05/23/2015.   Wide excision/mastoplasty, axillary dissection. No residual invasive cancer, positive for residual DCIS. 0/2 nodes identified on axillary dissection. (no SLN by technetium or methylene blue)  . CARDIAC CATHETERIZATION    . COLONOSCOPY WITH PROPOFOL N/A 12/10/2016   Procedure: COLONOSCOPY WITH PROPOFOL;  Surgeon: DaLucilla LameMD;  Location: [facility] END[facility];  Service: Endoscopy;  Laterality: N/A;  . COLONOSCOPY WITH PROPOFOL N/A 02/13/2017   Procedure: COLONOSCOPY WITH PROPOFOL;  Surgeon: WoLucilla LameMD;  [facility];  Service: Endoscopy;  Laterality: N/A;  . ESOPHAGOGASTRODUODENOSCOPY (EGD) WITH PROPOFOL N/A 12/08/2016   Procedure: ESOPHAGOGASTRODUODENOSCOPY (EGD) WITH PROPOFOL;  Surgeon: DaLucilla LameMD;  Location: [facility] ENDOSCOPY;  Service: Endoscopy;  Laterality: N/A;  . IVC FILTER INSERTION N/A 02/15/2017   Procedure: IVC Filter Insertion;  Surgeon:  Algernon Huxley, MD;  Location: [facility];  Service: Cardiovascular;  Laterality: N/A;  . PORTACATH PLACEMENT Right 12-31-14   Dr Bary Castilla    Allergies  Allergies  Allergen Reactions  . No Known Allergies     History of Present Illness    Sarah Horne is a 62 year old female with a history of PSVT, ?  CAD with MI in 1998 , Breast Ca, Chemo-induced cardiomyopathy, hypertension, hyperlipidemia, and neuropathy. Seen today for follow up of PSVT. She has a remote history of MI in 1998 for which she underwent cardiac cath at [facility] that required no intervention and has been medically managed since. Details from that admission are not available.  She was diagnosed with metastatic breast cancer, with metastisis to liver and brain in 2016 for which she is undergoing chemotherapy. She developed chemo-induced cardiomyopathy related to past treatment of breast cancer with Herceptin in 2017. Her EF was reduced at low as 45% during treatment, however subsequent echos showed improvement of EF to baseline after treatment. Latest echo in 12/2016 showed and EF of 55-60% with Grade 1 DD. In March of 2018 she developed PSVT. Was initially treated with beta blocker, flecainide and replacement of K+ and Mag, but BB had to be discontinued 2/2 hypotension. In October of 2018, flecainide was attempted to be stopped but she experienced a return of her tachycardia into the 140's and flecainide was resumed in December.   Since then she has been doing well. She remains on oral chemotherapeutic medications for breast cancer and is followed closely by oncology. She has not experienced any recent episodes of chest pain, SOB, DOE, tachycardia, hypotension, palpitations, dizziness, edema or syncope. She is able to participate in her daily activities without complication. She is slightly tachycardic in the office today at 103 (sinus), but this appears to be her baseline; however she reports not having taken her morning dose of flecainide yet today.   Home Medications    Prior to Admission medications   Medication Sig Start Date End Date Taking? Authorizing Provider  acetic acid-hydrocortisone (VOSOL-HC) OTIC solution Place 2 drops into both ears daily as needed. 01/18/18  Yes Poulose, Bethel Born, NP  capecitabine (XELODA) 500 MG tablet TAKE 4  TABLETS BY MOUTH TWICE DAILY AFTER A MEAL. TAKE FOR 1 WEEK ON THEN 1 WEEK OFF 06/30/18  Yes Cammie Sickle, MD  flecainide (TAMBOCOR) 50 MG tablet Take 1 tablet (50 mg total) by mouth 2 (two) times daily. 09/10/17  Yes Wellington Hampshire, MD  levocetirizine (XYZAL) 5 MG tablet Take 1 tablet (5 mg total) by mouth every evening. 01/18/18  Yes Poulose, Bethel Born, NP  LYRICA 75 MG capsule TAKE ONE CAPSULE BY MOUTH TWICE A DAY 04/25/18  Yes Cammie Sickle, MD  magnesium oxide (MAG-OX) 400 MG tablet TAKE 1 TABLET (400 MG TOTAL) BY MOUTH 2 (TWO) TIMES DAILY. 04/21/18  Yes Dunn, Areta Haber, PA-C  potassium chloride (KLOR-CON) 20 MEQ packet Take by mouth 2 (two) times daily.   Yes [provider]  triamcinolone ointment (KENALOG) 0.5 % Apply 1 application topically 2 (two) times daily. Patient taking differently: Apply 1 application topically as needed.  05/02/18  Yes Cammie Sickle, MD  Vitamin D, Ergocalciferol, (DRISDOL) 50000 units CAPS capsule TAKE 1 CAPSULE (50,000 UNITS TOTAL) ONCE A WEEK BY MOUTH. 07/29/18  Yes Cammie Sickle, MD    Review of Systems    She denies chest pain, palpitations, dyspnea, pnd, orthopnea, n, v,  dizziness, syncope, edema, weight gain, or early satiety.  All other systems reviewed and are otherwise negative except as noted above.  Physical Exam    VS:  BP 120/70 (BP Location: Left Arm, Patient Position: Sitting, Cuff Size: Normal)   Pulse (!) 103   Ht 5' 6"  (1.676 m)   Wt 163 lb 4 oz (74 kg)   BMI 26.35 kg/m  , BMI Body mass index is 26.35 kg/m. GEN: Well nourished, well developed, in no acute distress. HEENT: normal. Neck: Supple, no JVD, carotid bruits, or masses. Cardiac: RRR, no murmurs, rubs, or gallops. No clubbing, cyanosis, edema.  Radials/DP/PT 2+ and equal bilaterally.  Respiratory:  Respirations regular and unlabored, clear to auscultation bilaterally. GI: Soft, nontender, nondistended, BS + x 4. MS: no deformity or  atrophy. Skin: warm and dry, no rash. Dark pigmentation noted on palms related to chemo side effects. Neuro:  Strength and sensation are intact. Psych: Normal affect.  Accessory Clinical Findings    ECG personally reviewed by me today - sinus tachycardia, 103, RAD - no acute changes.  Assessment & Plan    1.  PSVT: Has been stable over past several months - asymptomatic. Denies recent issues with tachycardia, palpitations, dizziness or syncope. Will continue Flecainide and electrolyte supplementation.    2. H/o MI: unclear h/o CAD.  Atherosclerosis noted on chest CT's more recently.  No chest pain or SOB.  No ASA in setting of prior large GIB w/ gastritis/PUD.  3. Herceptin-induced cardiomyopathy: Stable, denies sob, edema, or palpitations. She is able to participate in daily activities without complication. Last echo showed recovered EF of 55-60%.   4. Disposition: Follow up in 6 months or sooner if needed.    Murray Hodgkins, NP 08/05/2018, 9:11 AM

## 2018-08-05 NOTE — Telephone Encounter (Signed)
...  Ref Range & Units 7d ago  WBC 4.0 - 10.5 K/uL 3.6Low    RBC 3.87 - 5.11 MIL/uL 4.04   Hemoglobin 12.0 - 15.0 g/dL 13.4   HCT 36.0 - 46.0 % 40.1   MCV 80.0 - 100.0 fL 99.3   MCH 26.0 - 34.0 pg 33.2   MCHC 30.0 - 36.0 g/dL 33.4   RDW 11.5 - 15.5 % 17.3High    Platelets 150 - 400 K/uL 168   nRBC 0.0 - 0.2 % 0.0   Neutrophils Relative % % 64   Neutro Abs 1.7 - 7.7 K/uL 2.3   Lymphocytes Relative % 25   Lymphs Abs 0.7 - 4.0 K/uL 0.9   Monocytes Relative % 9   Monocytes Absolute 0.1 - 1.0 K/uL 0.3   Eosinophils Relative % 2   Eosinophils Absolute 0.0 - 0.5 K/uL 0.1   Basophils Relative % 0   Basophils Absolute 0.0 - 0.1 K/uL 0.0   Immature Granulocytes % 0   Abs Immature Granulocytes 0.00 - 0.07 K/uL 0.00   Comment: Performed at [facility], [address] [place], [place] [address]  Resulting Agency  [facility]      Specimen Collected: 07/29/18 13:36  Last Resulted: 07/29/18 13:51          Ref Range & Units 7d ago  Sodium 135 - 145 mmol/L 137   Potassium 3.5 - 5.1 mmol/L 4.0   Chloride 98 - 111 mmol/L 104   CO2 22 - 32 mmol/L 25   Glucose, Bld 70 - 99 mg/dL 109High    BUN 8 - 23 mg/dL 21   Creatinine, Ser 0.44 - 1.00 mg/dL 0.88   Calcium 8.9 - 10.3 mg/dL 9.4   Total Protein 6.5 - 8.1 g/dL 7.7   Albumin 3.5 - 5.0 g/dL 4.1   AST 15 - 41 U/L 35   ALT 0 - 44 U/L 27   Alkaline Phosphatase 38 - 126 U/L 113   Total Bilirubin 0.3 - 1.2 mg/dL 0.9   GFR calc non Af Amer >60 mL/min >60   GFR calc Af Amer >60 mL/min >60   Comment: (NOTE)  The eGFR has been calculated using the CKD EPI equation.  This calculation has not been validated in all clinical situations.  eGFR's persistently <60 mL/min signify possible Chronic Kidney  Disease.   Anion gap 5 - 15 8   Comment: Performed at [facility], [address] [place], [place] [address]  Resulting Agency  [facility] LAB      Specimen Collected: 07/29/18 13:36  Last Resulted: 07/29/18 14:03

## 2018-08-11 ENCOUNTER — Ambulatory Visit
Admission: RE | Admit: 2018-08-11 | Discharge: 2018-08-11 | Disposition: A | Discharge status: Home / Self Care | Payer: BLUE CROSS/BLUE SHIELD | Source: Ambulatory Visit | Attending: Internal Medicine | Admitting: Internal Medicine

## 2018-08-11 DIAGNOSIS — D259 Leiomyoma of uterus, unspecified: Secondary | ICD-10-CM | POA: Diagnosis not present

## 2018-08-11 DIAGNOSIS — M84454A Pathological fracture, pelvis, initial encounter for fracture: Secondary | ICD-10-CM | POA: Insufficient documentation

## 2018-08-11 DIAGNOSIS — Z170 Estrogen receptor positive status [ER+]: Secondary | ICD-10-CM | POA: Insufficient documentation

## 2018-08-11 DIAGNOSIS — I700 Atherosclerosis of aorta: Secondary | ICD-10-CM | POA: Diagnosis not present

## 2018-08-11 DIAGNOSIS — C50412 Malignant neoplasm of upper-outer quadrant of left female breast: Secondary | ICD-10-CM

## 2018-08-11 DIAGNOSIS — C787 Secondary malignant neoplasm of liver and intrahepatic bile duct: Secondary | ICD-10-CM | POA: Insufficient documentation

## 2018-08-11 MED ORDER — IOPAMIDOL (ISOVUE-300) INJECTION 61%
100.0000 mL | Freq: Once | INTRAVENOUS | Status: AC | PRN
  Administered 2018-08-11: 100 mL via INTRAVENOUS

## 2018-08-16 ENCOUNTER — Ambulatory Visit
Admission: RE | Admit: 2018-08-16 | Discharge: 2018-08-16 | Disposition: A | Discharge status: Home / Self Care | Payer: BLUE CROSS/BLUE SHIELD | Source: Ambulatory Visit | Attending: Internal Medicine | Admitting: Internal Medicine

## 2018-08-16 DIAGNOSIS — C50412 Malignant neoplasm of upper-outer quadrant of left female breast: Secondary | ICD-10-CM | POA: Diagnosis not present

## 2018-08-16 DIAGNOSIS — Z170 Estrogen receptor positive status [ER+]: Secondary | ICD-10-CM | POA: Insufficient documentation

## 2018-08-16 DIAGNOSIS — C50919 Malignant neoplasm of unspecified site of unspecified female breast: Secondary | ICD-10-CM | POA: Diagnosis not present

## 2018-08-16 DIAGNOSIS — C7931 Secondary malignant neoplasm of brain: Secondary | ICD-10-CM | POA: Diagnosis not present

## 2018-08-16 MED ORDER — GADOBUTROL 1 MMOL/ML IV SOLN
7.5000 mL | Freq: Once | INTRAVENOUS | Status: AC | PRN
  Administered 2018-08-16: 7.5 mL via INTRAVENOUS

## 2018-08-18 ENCOUNTER — Telehealth: Payer: Self-pay | Admitting: Cardiovascular Disease

## 2018-08-18 MED ORDER — FLECAINIDE ACETATE 50 MG PO TABS: 50.0000 mg | ORAL_TABLET | Freq: Two times a day (BID) | ORAL | 11 refills | Status: DC

## 2018-08-18 NOTE — Telephone Encounter (Signed)
°*  STAT* If patient is at the pharmacy, call can be transferred to refill team.   1. Which medications need to be refilled? (please list name of each medication and dose if known) flecainide (TAMBOCOR) 50 MG - 1 tablet 2 times daily   2. Which pharmacy/location (including street and city if local pharmacy) is medication to be sent to? [facility] [phone]  3. Do they need a 30 day or 90 day supply? 90 day

## 2018-08-18 NOTE — Telephone Encounter (Signed)
Refill of Tambocor 50 mg sent to mail order pharmacy.

## 2018-08-19 ENCOUNTER — Other Ambulatory Visit: Payer: Self-pay

## 2018-08-19 ENCOUNTER — Telehealth: Payer: Self-pay | Admitting: Pharmacy Technician

## 2018-08-19 ENCOUNTER — Telehealth: Payer: Self-pay | Admitting: Pharmacist

## 2018-08-19 ENCOUNTER — Inpatient Hospital Stay (HOSPITAL_BASED_OUTPATIENT_CLINIC_OR_DEPARTMENT_OTHER): Payer: BLUE CROSS/BLUE SHIELD | Admitting: Internal Medicine

## 2018-08-19 ENCOUNTER — Inpatient Hospital Stay: Payer: BLUE CROSS/BLUE SHIELD

## 2018-08-19 VITALS — BP 124/85 | HR 116 | Temp 97.9°F | Resp 16 | Wt 159.0 lb

## 2018-08-19 DIAGNOSIS — F17210 Nicotine dependence, cigarettes, uncomplicated: Secondary | ICD-10-CM | POA: Diagnosis not present

## 2018-08-19 DIAGNOSIS — C7931 Secondary malignant neoplasm of brain: Secondary | ICD-10-CM

## 2018-08-19 DIAGNOSIS — Z170 Estrogen receptor positive status [ER+]: Secondary | ICD-10-CM

## 2018-08-19 DIAGNOSIS — L271 Localized skin eruption due to drugs and medicaments taken internally: Secondary | ICD-10-CM | POA: Diagnosis not present

## 2018-08-19 DIAGNOSIS — G629 Polyneuropathy, unspecified: Secondary | ICD-10-CM

## 2018-08-19 DIAGNOSIS — C50412 Malignant neoplasm of upper-outer quadrant of left female breast: Secondary | ICD-10-CM

## 2018-08-19 DIAGNOSIS — C7949 Secondary malignant neoplasm of other parts of nervous system: Secondary | ICD-10-CM

## 2018-08-19 DIAGNOSIS — M8580 Other specified disorders of bone density and structure, unspecified site: Secondary | ICD-10-CM

## 2018-08-19 DIAGNOSIS — Z7902 Long term (current) use of antithrombotics/antiplatelets: Secondary | ICD-10-CM

## 2018-08-19 DIAGNOSIS — C787 Secondary malignant neoplasm of liver and intrahepatic bile duct: Secondary | ICD-10-CM | POA: Diagnosis not present

## 2018-08-19 DIAGNOSIS — T451X5D Adverse effect of antineoplastic and immunosuppressive drugs, subsequent encounter: Secondary | ICD-10-CM | POA: Diagnosis not present

## 2018-08-19 DIAGNOSIS — C50919 Malignant neoplasm of unspecified site of unspecified female breast: Secondary | ICD-10-CM

## 2018-08-19 DIAGNOSIS — Z79899 Other long term (current) drug therapy: Secondary | ICD-10-CM | POA: Diagnosis not present

## 2018-08-19 LAB — CBC WITH DIFFERENTIAL/PLATELET
Abs Immature Granulocytes: 0.01 10*3/uL (ref 0.00–0.07)
BASOS ABS: 0 10*3/uL (ref 0.0–0.1)
Basophils Relative: 0 %
EOS ABS: 0 10*3/uL (ref 0.0–0.5)
EOS PCT: 1 %
HCT: 42.5 % (ref 36.0–46.0)
HEMOGLOBIN: 14.3 g/dL (ref 12.0–15.0)
Immature Granulocytes: 0 %
LYMPHS ABS: 0.9 10*3/uL (ref 0.7–4.0)
Lymphocytes Relative: 25 %
MCH: 33.3 pg (ref 26.0–34.0)
MCHC: 33.6 g/dL (ref 30.0–36.0)
MCV: 98.8 fL (ref 80.0–100.0)
MONO ABS: 0.2 10*3/uL (ref 0.1–1.0)
Monocytes Relative: 6 %
Neutro Abs: 2.3 10*3/uL (ref 1.7–7.7)
Neutrophils Relative %: 68 %
Platelets: 176 10*3/uL (ref 150–400)
RBC: 4.3 MIL/uL (ref 3.87–5.11)
RDW: 16.8 % — AB (ref 11.5–15.5)
WBC: 3.5 10*3/uL — ABNORMAL LOW (ref 4.0–10.5)
nRBC: 0 % (ref 0.0–0.2)

## 2018-08-19 LAB — COMPREHENSIVE METABOLIC PANEL
ALT: 25 U/L (ref 0–44)
AST: 36 U/L (ref 15–41)
Albumin: 4.1 g/dL (ref 3.5–5.0)
Alkaline Phosphatase: 113 U/L (ref 38–126)
Anion gap: 7 (ref 5–15)
BILIRUBIN TOTAL: 1.3 mg/dL — AB (ref 0.3–1.2)
BUN: 14 mg/dL (ref 8–23)
CHLORIDE: 102 mmol/L (ref 98–111)
CO2: 26 mmol/L (ref 22–32)
CREATININE: 0.94 mg/dL (ref 0.44–1.00)
Calcium: 9.7 mg/dL (ref 8.9–10.3)
GFR calc Af Amer: >60 mL/min (ref >60)
GFR calc non Af Amer: >60 mL/min (ref >60)
GLUCOSE: 121 mg/dL — AB (ref 70–99)
Potassium: 4 mmol/L (ref 3.5–5.1)
Sodium: 135 mmol/L (ref 135–145)
TOTAL PROTEIN: 7.6 g/dL (ref 6.5–8.1)

## 2018-08-19 MED ORDER — TRIAMCINOLONE ACETONIDE 0.5 % EX OINT: 1.0000 "application " | TOPICAL_OINTMENT | Freq: Two times a day (BID) | CUTANEOUS | 2 refills | Status: DC

## 2018-08-19 NOTE — Patient Instructions (Signed)
#   STOP Xeloda

## 2018-08-19 NOTE — Progress Notes (Signed)
[place] OFFICE PROGRESS NOTE  Patient Care Team: Steele Sizer, MD as PCP - General (Family Medicine) Wellington Hampshire, MD as PCP - Cardiology (Cardiology) Bary Castilla, Forest Gleason, MD (General Surgery) [name], MD (Inactive) as Attending Physician (Internal Medicine)  Cancer Staging No matching staging information was found for the patient.   Oncology History   # LEFT BREAST IDC; STAGE II [cT2N1] ER >90%; PR- 50-90%; her 2 NEU- POS; s/p Neoadj chemo; AUG 2016- TCH+P s/p Lumpec & partial ALND- path CR; s/p RT [finished Nov 2016];  adj Herceptin; HELD for Jan 19th 2017 [in DEC 2016-EF dropped from 63 to 51%; FEB 27th EF-42.9%]; JAN 2016 START Arimidex; May 2017- EF- 60%; May 24th 2017-Re-start Herceptin q 3W; July 28th STOP herceptin [finished 60m/m2; sec to Low EF; however 2017 Oct EF= 67%; improved]  # DEC 4th 2017- Start Neratinib 4 pills; DEC 11th 3 pills; STOPPED.  # FEB 2018- RECURRENCE ER/PR positive; Her 2 NEGATIVE [liver Bx]  # MARCH 1st 2018- Tax-Cytoxan [poor tol]  # FEB 2018- Brain mets [SBRT; Dr.Crystal; finished Feb 28th 2018]  # March 2018- Eribulin- poor tolerance  # June 8th 2018- Started Faslodex + Abema [abema-multiple interruptions sec to neutropenia]  # MRI Brain- March 2019- Leptomeningeal mets [WBRT; No LP s/p WBRT- 01/09/2018]; STOP Abema [51mday-sec to severe cytopenia]+faslodex  # MAY 20tth 019- Start Xeloda; STOPPED in Nov 22nd 2019- sec to Progressive liver lesions on CT scan  # NOV last week- Faslodex + Piqaray _0   # PN- G-2; may 2017- Cymbalta 60103m;MARCH 2018-  acute vascular insuff of BIL LE [? Taxotere vasoconstriction on asprin]; # hemorragic shock LGIB [s/p colo; Dr.Wohl]  #  SVT- on flecainide.   # April 2016- Liver Bx- NEG;   # Drop in EF from Herceptin [recovered OCt 27th- EF 65%]  # BMD- jan 2017- osteopenia  -----------------------------------------------------------     [place]-  Sep 4th 2018- PDL-1 0%/NEG for BRCA; MSI-Stable; Positive for PI3K; ccnd-1; FGF** -----------------------------------------------------------  Dx: Metastatic Breast cancer [ER/PR-Pos; her -2 NEG] Stage IV; goals: palliative Current treatment- Faslodex + Piqaray     Carcinoma of upper-outer quadrant of left breast in female, estrogen receptor positive ([facility]    INTERVAL HISTORY:  Sarah Horne 33o.  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  Patient continues to complain of significant hyperpigmentation of the palms and soles.  Denies any significant pain.  Admits to intermittent mild peeling of the skin of her palms.  No sores in the mouth.  No nausea vomiting.  No chest pain.  No abdominal pain.  Review of Systems  Constitutional: Positive for malaise/fatigue. Negative for chills, diaphoresis, fever and weight loss.  HENT: Negative for nosebleeds and sore throat.   Eyes: Negative for double vision.  Respiratory: Negative for cough, hemoptysis, sputum production, shortness of breath and wheezing.   Cardiovascular: Negative for chest pain, palpitations, orthopnea and leg swelling.  Gastrointestinal: Negative for abdominal pain, blood in stool, constipation, diarrhea, heartburn, melena, nausea and vomiting.  Genitourinary: Negative for dysuria, frequency and urgency.  Musculoskeletal: Positive for joint pain. Negative for back pain.  Skin: Positive for rash. Negative for itching.  Neurological: Positive for tingling. Negative for dizziness, focal weakness, weakness and headaches.  Endo/Heme/Allergies: Does not bruise/bleed easily.  Psychiatric/Behavioral: Negative for depression. The patient is not nervous/anxious  and does not have insomnia.       PAST MEDICAL HISTORY :  Past Medical History:  Diagnosis Date  .  Chemotherapy-induced peripheral neuropathy ([place])   . Epistaxis    a. 11/2016 in setting of asa/plavix-->silver nitrate cauterization.  . GI bleed    a. 11/2016 Admission w/ GIB and hypovolemic shock req 3u PRBC's;  b. 11/2016 ECG: gastritis & nonbleeding peptic ulcer; c. 11/2016 Conlonoscopy: rectal and sigmoid colonic ulcers.  . Heart attack ([place])    a. 1998 Cath @ [facility]: reportedly no intervention required.  Marland Kitchen Herceptin-induced cardiomyopathy ([place])    a. In the setting of Herceptin Rx for breast cancer (initiated 12/2014); b. 03/2015 MUGA EF 64%; b. 08/2015 MUGA: EF 51%; c. 10/2015 MUGA: EF 44%; d. 11/2015 Echo: EF 45-50%; e. 01/2016 MUGA: EF 60%; f. 06/2016 MUGA EF 65%; g. 10/2016 MUGA: EF 61%;  h. 12/2016 Echo: EF 55-60%, gr1 DD.  Marland Kitchen Hyperlipidemia   . Hypertension   . Neuropathy   . Possible PAD (peripheral artery disease) ([place])    a. 11/2016 LE cyanosis and weak pulses-->CTA w/o significant Ao-BiFem dzs. ? distal dzs-->ASA/Plavix initiated by vascular surgery.  Marland Kitchen PSVT (paroxysmal supraventricular tachycardia) ([place])    a. Dx 11/2016.  . Pulmonary embolism ([place])    a. 12/2016 CTA Chest: small nonocclusive PE in inferior segment of the Left lingula, somewhat eccentric filling defect suggesting chronic rather than acute embolic event; b. 01/276 LE U/S:  No DVT; c. 12/2016 Echo: Nl RV fxn, nl PASP.  Marland Kitchen Recurrent Metastatic breast cancer ([place])    a. Dx 2016: Stage II, ER positive, PR positive, HER-2/neu overexpressing of the left breast-->chemo/radiation; b. 10/2016 CT Abd/pelvis: diffuse liver mets, ill defined sclerotic bone lesions-T12;  c. 10/2016 MRI brain: metastatic lesion along L temporal lobe (19x88m) w/ extensive surrounding edema & 537mmidline shift to right.  . Sinus tachycardia     PAST SURGICAL HISTORY :   Past Surgical History:  Procedure Laterality Date  . BREAST BIOPSY Left 2016   Positive  . BREAST LUMPECTOMY WITH SENTINEL LYMPH NODE BIOPSY Left 05/23/2015   Procedure: LEFT BREAST WIDE  EXCISION WITH AXILLARY DISSECTION, MASTOPLASTY ;  Surgeon: JeRobert BellowMD;  Location: [facility] ORS;  Service: General;  Laterality: Left;  . BREAST SURGERY Left 12/18/14   breast biopsy/INVASIVE DUCTAL CARCINOMA OF BREAST, NOTTINGHAM GRADE 2.  . Marland KitchenREAST SURGERY  05/23/2015.   Wide excision/mastoplasty, axillary dissection. No residual invasive cancer, positive for residual DCIS. 0/2 nodes identified on axillary dissection. (no SLN by technetium or methylene blue)  . CARDIAC CATHETERIZATION    . COLONOSCOPY WITH PROPOFOL N/A 12/10/2016   Procedure: COLONOSCOPY WITH PROPOFOL;  Surgeon: DaLucilla LameMD;  Location: [facility] END[facility];  Service: Endoscopy;  Laterality: N/A;  . COLONOSCOPY WITH PROPOFOL N/A 02/13/2017   Procedure: COLONOSCOPY WITH PROPOFOL;  Surgeon: WoLucilla LameMD;  [facility];  Service: Endoscopy;  Laterality: N/A;  . ESOPHAGOGASTRODUODENOSCOPY (EGD) WITH PROPOFOL N/A 12/08/2016   Procedure: ESOPHAGOGASTRODUODENOSCOPY (EGD) WITH PROPOFOL;  Surgeon: DaLucilla LameMD;  Location: [facility];  Service: Endoscopy;  Laterality: N/A;  . IVC FILTER INSERTION N/A 02/15/2017   Procedure: IVC Filter Insertion;  Surgeon: DeAlgernon HuxleyMD;  [facility];  Service: Cardiovascular;  Laterality: N/A;  . PORTACATH PLACEMENT Right 12-31-14   Dr ByBary Castilla  FAMILY HISTORY :   Family History  Problem Relation Age of Onset  . Breast cancer Maternal Aunt   . Breast cancer  Cousin   . Brain cancer Maternal Uncle   . Diabetes Mother   . Hypertension Mother   . Stroke Mother     SOCIAL HISTORY:   Social History   Tobacco Use  . Smoking status: Light Tobacco Smoker    Packs/day: 0.50    Years: 18.00    Pack years: 9.00    Types: Cigarettes  . Smokeless tobacco: Never Used  Substance Use Topics  . Alcohol use: No    Alcohol/week: 0.0 standard drinks  . Drug use: No    ALLERGIES:  is allergic to no known allergies.  MEDICATIONS:  Current Outpatient  Medications  Medication Sig Dispense Refill  . acetic acid-hydrocortisone (VOSOL-HC) OTIC solution Place 2 drops into both ears daily as needed. 10 mL 0  . capecitabine (XELODA) 500 MG tablet TAKE 4 TABLETS BY MOUTH TWICE DAILY AFTER A MEAL. TAKE FOR 1 WEEK ON THEN 1 WEEK OFF 112 tablet PRN  . flecainide (TAMBOCOR) 50 MG tablet Take 1 tablet (50 mg total) by mouth 2 (two) times daily. 60 tablet 11  . levocetirizine (XYZAL) 5 MG tablet Take 1 tablet (5 mg total) by mouth every evening. 30 tablet 3  . LYRICA 75 MG capsule TAKE ONE CAPSULE BY MOUTH TWICE A DAY 60 capsule 4  . magnesium oxide (MAG-OX) 400 MG tablet TAKE 1 TABLET (400 MG TOTAL) BY MOUTH 2 (TWO) TIMES DAILY. 60 tablet 6  . potassium chloride (KLOR-CON) 20 MEQ packet Take by mouth 2 (two) times daily.    Marland Kitchen triamcinolone ointment (KENALOG) 0.5 % Apply 1 application topically 2 (two) times daily. 30 g 2  . Vitamin D, Ergocalciferol, (DRISDOL) 50000 units CAPS capsule TAKE 1 CAPSULE (50,000 UNITS TOTAL) ONCE A WEEK BY MOUTH. 12 capsule 1  . Alpelisib (PIQRAY 250MG DAILY DOSE) 200 & 50 MG Therapy Pack Take 250 mg by mouth daily. Take one 210m tablet and one 53mtablet with food at the same time each day. Swallow tablets whole, do not chew, crush, or split. 30 each 4   No current facility-administered medications for this visit.    Facility-Administered Medications Ordered in Other Visits  Medication Dose Route Frequency Provider Last Rate Last Dose  . 0.9 %  sodium chloride infusion   Intravenous Continuous Pandit, Sandeep, MD      . 0.9 %  sodium chloride infusion   Intravenous Continuous FiLloyd HugerMD 999 mL/hr at 04/24/15 1520    . heparin lock flush 100 unit/mL  500 Units Intravenous Once Berenzon, Dmitriy, MD      . sodium chloride 0.9 % injection 10 mL  10 mL Intravenous PRN PaLeia AlfMD   10 mL at 04/04/15 1440  . sodium chloride flush (NS) 0.9 % injection 10 mL  10 mL Intravenous PRN Berenzon, Dmitriy, MD      .  Tbo-Filgrastim (GRANIX) injection 480 mcg  480 mcg Subcutaneous Once BrCammie SickleMD        PHYSICAL EXAMINATION: ECOG PERFORMANCE STATUS: 1 - Symptomatic but completely ambulatory  BP 124/85 (BP Location: Left Arm, Patient Position: Sitting)   Pulse (!) 116   Temp 97.9 F (36.6 C) (Oral)   Resp 16   Wt 159 lb (72.1 kg)   BMI 25.66 kg/m   Filed Weights   08/19/18 1402  Weight: 159 lb (72.1 kg)    Physical Exam  Constitutional: She is oriented to person, place, and time and well-developed, well-nourished, and in no distress.  Alone.  Walking herself.  HENT:  Head: Normocephalic and atraumatic.  Mouth/Throat: Oropharynx is clear and moist. No oropharyngeal exudate.  Eyes: Pupils are equal, round, and reactive to light.  Neck: Normal range of motion. Neck supple.  Cardiovascular: Normal rate and regular rhythm.  Pulmonary/Chest: No respiratory distress. She has no wheezes.  Abdominal: Soft. Bowel sounds are normal. She exhibits no distension and no mass. There is no tenderness. There is no rebound and no guarding.  Musculoskeletal: Normal range of motion. She exhibits no edema or tenderness.  Neurological: She is alert and oriented to person, place, and time.  Skin: Skin is warm.  Significant hyperpigmentation of the bilateral hand and feet.  No desquamation.  Psychiatric: Affect normal.       LABORATORY DATA:  I have reviewed the data as listed    Component Value Date/Time   NA 135 08/19/2018 1335   NA 135 03/04/2017 0918   NA 135 01/08/2015 0850   K 4.0 08/19/2018 1335   K 3.7 01/08/2015 0850   CL 102 08/19/2018 1335   CL 101 01/08/2015 0850   CO2 26 08/19/2018 1335   CO2 26 01/08/2015 0850   GLUCOSE 121 (H) 08/19/2018 1335   GLUCOSE 161 (H) 01/08/2015 0850   BUN 14 08/19/2018 1335   BUN 8 03/04/2017 0918   BUN 17 01/08/2015 0850   CREATININE 0.94 08/19/2018 1335   CREATININE 0.82 01/22/2015 1559   CALCIUM 9.7 08/19/2018 1335   CALCIUM 9.3  01/08/2015 0850   PROT 7.6 08/19/2018 1335   PROT 6.4 03/04/2017 0918   PROT 6.8 01/22/2015 1559   ALBUMIN 4.1 08/19/2018 1335   ALBUMIN 2.5 (L) 03/04/2017 0918   ALBUMIN 3.9 01/22/2015 1559   AST 36 08/19/2018 1335   AST 34 01/22/2015 1559   ALT 25 08/19/2018 1335   ALT 42 01/22/2015 1559   ALKPHOS 113 08/19/2018 1335   ALKPHOS 157 (H) 01/22/2015 1559   BILITOT 1.3 (H) 08/19/2018 1335   BILITOT 6.2 (H) 03/04/2017 0918   BILITOT 0.2 (L) 01/22/2015 1559   GFRNONAA >60 08/19/2018 1335   GFRNONAA >60 01/22/2015 1559   GFRAA >60 08/19/2018 1335   GFRAA >60 01/22/2015 1559    No results found for: SPEP, UPEP  Lab Results  Component Value Date   WBC 3.5 (L) 08/19/2018   NEUTROABS 2.3 08/19/2018   HGB 14.3 08/19/2018   HCT 42.5 08/19/2018   MCV 98.8 08/19/2018   PLT 176 08/19/2018      Chemistry      Component Value Date/Time   NA 135 08/19/2018 1335   NA 135 03/04/2017 0918   NA 135 01/08/2015 0850   K 4.0 08/19/2018 1335   K 3.7 01/08/2015 0850   CL 102 08/19/2018 1335   CL 101 01/08/2015 0850   CO2 26 08/19/2018 1335   CO2 26 01/08/2015 0850   BUN 14 08/19/2018 1335   BUN 8 03/04/2017 0918   BUN 17 01/08/2015 0850   CREATININE 0.94 08/19/2018 1335   CREATININE 0.82 01/22/2015 1559      Component Value Date/Time   CALCIUM 9.7 08/19/2018 1335   CALCIUM 9.3 01/08/2015 0850   ALKPHOS 113 08/19/2018 1335   ALKPHOS 157 (H) 01/22/2015 1559   AST 36 08/19/2018 1335   AST 34 01/22/2015 1559   ALT 25 08/19/2018 1335   ALT 42 01/22/2015 1559   BILITOT 1.3 (H) 08/19/2018 1335   BILITOT 6.2 (H) 03/04/2017 0918   BILITOT 0.2 (L) 01/22/2015 1559  RADIOGRAPHIC STUDIES: I have personally reviewed the radiological images as listed and agreed with the findings in the report. No results found.   ASSESSMENT & PLAN:  Carcinoma of upper-outer quadrant of left breast in female, estrogen receptor positive ([place]) # Recurrent metastatic breast cancer-ER PR positive  HER-2 negative;   On xeloda-November 14 CT scan shows progressive liver lesions.   #I would recommend discontinuation of Xeloda-progressive disease.  #Discussed multiple options-including IV chemotherapy; also recently approved PI 3 kinase inhibitor [patient has PI 3 kinase mutation]; in combination with Faslodex.  Response rates about 40%; treatments are palliative not curative.  Discussed the the potential side effects including but not limited to increased blood sugars; diarrhea; also nausea vomiting.  Given previous multiple poor tolerance to therapies recommend starting at 250 mg/day.  # hand foot syndrome-/hyperpigmentation of hand and feet- G-1 continue kenalog; urea 20%; vaseline bid.  Stable.  Should improve with coming off Xeloda.  # Brain metastases-recurrent/likely leptomeningeal disease;  S/p WBRT [April 2019]. July MRI- improving; STABLE.  MRI stable.  #Peripheral neuropathy grade 1-  on Lyrica; stable  # DISPOSITION: # Faslodex injection next  # follow up in 2 weeks/labs-cbc/cmp/HbA1c.   # I reviewed the blood work- with the patient in detail; also reviewed the imaging independently [as summarized above]; and with the patient in detail.   # 40 minutes face-to-face with the patient discussing the above plan of care; more than 50% of time spent on prognosis/ natural history; counseling and coordination.     Orders Placed This Encounter  Procedures  . CBC with Differential/Platelet    Standing Status:   Future    Standing Expiration Date:   08/20/2019  . Comprehensive metabolic panel    Standing Status:   Future    Standing Expiration Date:   08/20/2019  . Hemoglobin A1c    Standing Status:   Future    Standing Expiration Date:   08/19/2019   All questions were answered. The patient knows to call the clinic with any problems, questions or concerns.      Cammie Sickle, MD 08/22/2018 7:59 AM

## 2018-08-19 NOTE — Telephone Encounter (Signed)
Oral Oncology Patient Advocate Encounter  Received notification from [facility] that prior authorization for Sarah Horne is required.  PA submitted on CoverMyMeds Key [id number] Status is pending  Oral Oncology Clinic will continue to follow.  [name] Phone [phone] Fax [phone] 08/19/2018 3:05 PM

## 2018-08-19 NOTE — Assessment & Plan Note (Addendum)
#  Recurrent metastatic breast cancer-ER PR positive HER-2 negative;   On xeloda-November 14 CT scan shows progressive liver lesions.   #I would recommend discontinuation of Xeloda-progressive disease.  #Discussed multiple options-including IV chemotherapy; also recently approved PI 3 kinase inhibitor [patient has PI 3 kinase mutation]; in combination with Faslodex.  Response rates about 40%; treatments are palliative not curative.  Discussed the the potential side effects including but not limited to increased blood sugars; diarrhea; also nausea vomiting.  Given previous multiple poor tolerance to therapies recommend starting at 250 mg/day.  # hand foot syndrome-/hyperpigmentation of hand and feet- G-1 continue kenalog; urea 20%; vaseline bid.  Stable.  Should improve with coming off Xeloda.  # Brain metastases-recurrent/likely leptomeningeal disease;  S/p WBRT [April 2019]. July MRI- improving; STABLE.  MRI stable.  #Peripheral neuropathy grade 1-  on Lyrica; stable  # DISPOSITION: # Faslodex injection next  # follow up in 2 weeks/labs-cbc/cmp/HbA1c.   # I reviewed the blood work- with the patient in detail; also reviewed the imaging independently [as summarized above]; and with the patient in detail.   # 40 minutes face-to-face with the patient discussing the above plan of care; more than 50% of time spent on prognosis/ natural history; counseling and coordination.

## 2018-08-19 NOTE — Telephone Encounter (Signed)
Oral Oncology Pharmacist Encounter  Received new prescription for Piqray (alpelisib) for the treatment of metastatic breast cancer ER/PR positive HER2 negative in conjunction with fulvestrant, planned duration until disease progression or unacceptable drug toxicity. Ms. Osmanovic had Foundation One molecular testing completed and was found to be PI3K mutation positive.  CMP from 07/29/18 assessed, this is likely not a fasting glucose elevation not concerning. Patient does NOT have a hx of diabetes. Recommend checking a fasting glucose baseline, weekly for the first 2 weeks, then at least every 4 weeks thereafter. Prescription dose and frequency assessed.   Current medication list in Epic reviewed, no DDIs with Piqray identified.  Prescription has been e-scribed to the [facility] for benefits analysis and approval.  Oral Oncology Clinic will continue to follow for insurance authorization, copayment issues, initial counseling and start date.  Darl Pikes, PharmD, BCPS, [facility] Hematology/Oncology Clinical Pharmacist [facility]/AP [facility] [place] [facility] [phone]  08/19/2018 12:02 PM

## 2018-08-20 LAB — CANCER ANTIGEN 27.29: CAN 27.29: 19.7 U/mL (ref 0.0–38.6)

## 2018-08-22 ENCOUNTER — Other Ambulatory Visit: Payer: Self-pay

## 2018-08-22 ENCOUNTER — Telehealth: Payer: Self-pay | Admitting: Pharmacy Technician

## 2018-08-22 DIAGNOSIS — T451X5A Adverse effect of antineoplastic and immunosuppressive drugs, initial encounter: Secondary | ICD-10-CM

## 2018-08-22 DIAGNOSIS — G620 Drug-induced polyneuropathy: Secondary | ICD-10-CM

## 2018-08-22 DIAGNOSIS — C50412 Malignant neoplasm of upper-outer quadrant of left female breast: Secondary | ICD-10-CM

## 2018-08-22 DIAGNOSIS — Z170 Estrogen receptor positive status [ER+]: Principal | ICD-10-CM

## 2018-08-22 MED ORDER — FLECAINIDE ACETATE 50 MG PO TABS: 50.0000 mg | ORAL_TABLET | Freq: Two times a day (BID) | ORAL | 3 refills | Status: DC

## 2018-08-22 MED ORDER — ALPELISIB (250 MG DAILY DOSE) 200 MG & 50 MG PO TBPK: 250.0000 mg | ORAL_TABLET | Freq: Every day | ORAL | 4 refills | Status: DC

## 2018-08-22 NOTE — Telephone Encounter (Signed)
Medication will be mailed out 11/26 for delivery 11/27.

## 2018-08-22 NOTE — Telephone Encounter (Signed)
Oral Oncology Patient Advocate Encounter  Prior Authorization for Sarah Horne has been approved.    PA# [id number] Effective dates: 06/19/18 through 08/19/19  Patients co-pay is $60.00  Oral Oncology Clinic will continue to follow.   [name] [place] Phone [phone] Fax [phone] 08/22/2018 8:31 AM

## 2018-08-22 NOTE — Telephone Encounter (Signed)
Patient calling States that [facility] has not received refill Please re-send   *STAT* If patient is at the pharmacy, call can be transferred to refill team.   1. Which medications need to be refilled? (please list name of each medication and dose if known) flecainide (TAMBOCOR) 50 MG - 1 tablet 2 times daily   2. Which pharmacy/location (including street and city if local pharmacy) is medication to be sent to? [facility] [phone]  3. Do they need a 30 day or 90 day supply? 90 day

## 2018-08-22 NOTE — Telephone Encounter (Addendum)
Patient called.  She would like triamcinolone ointment and Lyrica prescriptions sent to [facility].  Insurance will not cover medication at retail pharmacy and must use mail order.

## 2018-08-22 NOTE — Telephone Encounter (Signed)
Left voicemail for patient to return my call. 

## 2018-08-22 NOTE — Telephone Encounter (Signed)
flecainide (TAMBOCOR) 50 MG tablet 180 tablet 3 08/22/2018    Sig - Route: Take 1 tablet (50 mg total) by mouth 2 (two) times daily. - Oral   Sent to pharmacy as: flecainide (TAMBOCOR) 50 MG tablet   E-Prescribing Status: Receipt confirmed by pharmacy (08/22/2018 4:35 PM EST)   Pharmacy   [place], [place]

## 2018-08-23 ENCOUNTER — Telehealth: Payer: Self-pay | Admitting: Pharmacist

## 2018-08-23 ENCOUNTER — Other Ambulatory Visit: Payer: Self-pay | Admitting: *Deleted

## 2018-08-23 ENCOUNTER — Inpatient Hospital Stay: Payer: BLUE CROSS/BLUE SHIELD

## 2018-08-23 DIAGNOSIS — C50412 Malignant neoplasm of upper-outer quadrant of left female breast: Secondary | ICD-10-CM

## 2018-08-23 DIAGNOSIS — T451X5A Adverse effect of antineoplastic and immunosuppressive drugs, initial encounter: Secondary | ICD-10-CM

## 2018-08-23 DIAGNOSIS — Z170 Estrogen receptor positive status [ER+]: Secondary | ICD-10-CM

## 2018-08-23 DIAGNOSIS — Z79899 Other long term (current) drug therapy: Secondary | ICD-10-CM | POA: Diagnosis not present

## 2018-08-23 DIAGNOSIS — E860 Dehydration: Secondary | ICD-10-CM

## 2018-08-23 DIAGNOSIS — C7931 Secondary malignant neoplasm of brain: Secondary | ICD-10-CM | POA: Diagnosis not present

## 2018-08-23 DIAGNOSIS — C787 Secondary malignant neoplasm of liver and intrahepatic bile duct: Secondary | ICD-10-CM | POA: Diagnosis not present

## 2018-08-23 DIAGNOSIS — L271 Localized skin eruption due to drugs and medicaments taken internally: Secondary | ICD-10-CM | POA: Diagnosis not present

## 2018-08-23 DIAGNOSIS — M8580 Other specified disorders of bone density and structure, unspecified site: Secondary | ICD-10-CM | POA: Diagnosis not present

## 2018-08-23 DIAGNOSIS — F17210 Nicotine dependence, cigarettes, uncomplicated: Secondary | ICD-10-CM | POA: Diagnosis not present

## 2018-08-23 DIAGNOSIS — Z7902 Long term (current) use of antithrombotics/antiplatelets: Secondary | ICD-10-CM | POA: Diagnosis not present

## 2018-08-23 DIAGNOSIS — C7949 Secondary malignant neoplasm of other parts of nervous system: Secondary | ICD-10-CM | POA: Diagnosis not present

## 2018-08-23 DIAGNOSIS — G629 Polyneuropathy, unspecified: Secondary | ICD-10-CM | POA: Diagnosis not present

## 2018-08-23 DIAGNOSIS — G620 Drug-induced polyneuropathy: Secondary | ICD-10-CM

## 2018-08-23 DIAGNOSIS — T451X5D Adverse effect of antineoplastic and immunosuppressive drugs, subsequent encounter: Secondary | ICD-10-CM | POA: Diagnosis not present

## 2018-08-23 MED ORDER — PREGABALIN 75 MG PO CAPS: 75.0000 mg | ORAL_CAPSULE | Freq: Two times a day (BID) | ORAL | 4 refills | Status: DC

## 2018-08-23 MED ORDER — TRIAMCINOLONE ACETONIDE 0.5 % EX OINT: 1.0000 "application " | TOPICAL_OINTMENT | Freq: Two times a day (BID) | CUTANEOUS | 2 refills | Status: DC

## 2018-08-23 MED ORDER — FULVESTRANT 250 MG/5ML IM SOLN
500.0000 mg | Freq: Once | INTRAMUSCULAR | Status: AC
  Administered 2018-08-23: 500 mg via INTRAMUSCULAR

## 2018-08-23 MED ORDER — PREGABALIN 75 MG PO CAPS: 75.0000 mg | ORAL_CAPSULE | Freq: Two times a day (BID) | ORAL | 0 refills | Status: DC

## 2018-08-23 MED FILL — PIQRAY (250 MG DAILY DOSE): 200 & 50 | 28 days supply | Qty: 56 | Fill #0

## 2018-08-23 NOTE — Telephone Encounter (Signed)
Scripts sent to pharmacy per pt's request

## 2018-08-23 NOTE — Telephone Encounter (Signed)
lyrica [facility]

## 2018-08-23 NOTE — Telephone Encounter (Signed)
Oral Chemotherapy Pharmacist Encounter  Ms. Zobrist will receive her Piqray tomorrow 08/24/18. She knows to get started when she receives it. She will return to clinic on 09/01/18 for her already scheduled lab appt and MD visit. I asked pt to not eat before her lab work so that we can obtain a fasting glucose. She stated her understanding.   Patient Education I spoke with patient for overview of new oral chemotherapy medication: Piqray (alpelisib) for the treatment of metastatic breast cancer ER/PR positive HER2 negative in conjunction with fulvestrant, planned duration until disease progression or unacceptable drug toxicity. Ms. Gorczyca had Foundation One molecular testing completed and was found to be PI3K mutation positive.   Counseled patient on administration, dosing, side effects, monitoring, drug-food interactions, safe handling, storage, and disposal. Patient will take 250 mg by mouth daily. Take one 248m tablet and one 574mtablet with food at the same time each day.  Side effects include but not limited to: hyperglycemia, diarrhea, rash, N/V, decreased wbc.    Reviewed with patient importance of keeping a medication schedule and plan for any missed doses.  Ms. MeJonssonoiced understanding and appreciation. All questions answered. Medication handout given to patient.  Provided patient with [facility]. Patient knows to call the office with questions or concerns. Oral Chemotherapy Navigation Clinic will continue to follow.  AlDarl PikesPharmD, BCPS, [facility] Clinical Pharmacist [phone]/26/2019 4:23 PM

## 2018-08-25 ENCOUNTER — Other Ambulatory Visit: Payer: Self-pay | Admitting: Internal Medicine

## 2018-08-25 DIAGNOSIS — C50412 Malignant neoplasm of upper-outer quadrant of left female breast: Secondary | ICD-10-CM

## 2018-08-25 DIAGNOSIS — Z170 Estrogen receptor positive status [ER+]: Principal | ICD-10-CM

## 2018-09-01 ENCOUNTER — Inpatient Hospital Stay (HOSPITAL_BASED_OUTPATIENT_CLINIC_OR_DEPARTMENT_OTHER): Payer: BLUE CROSS/BLUE SHIELD | Admitting: Internal Medicine

## 2018-09-01 ENCOUNTER — Inpatient Hospital Stay: Payer: BLUE CROSS/BLUE SHIELD | Attending: Internal Medicine

## 2018-09-01 ENCOUNTER — Other Ambulatory Visit: Payer: Self-pay

## 2018-09-01 VITALS — BP 141/80 | HR 103 | Resp 16 | Wt 155.8 lb

## 2018-09-01 DIAGNOSIS — I252 Old myocardial infarction: Secondary | ICD-10-CM | POA: Diagnosis not present

## 2018-09-01 DIAGNOSIS — M8580 Other specified disorders of bone density and structure, unspecified site: Secondary | ICD-10-CM | POA: Insufficient documentation

## 2018-09-01 DIAGNOSIS — E876 Hypokalemia: Secondary | ICD-10-CM | POA: Insufficient documentation

## 2018-09-01 DIAGNOSIS — C787 Secondary malignant neoplasm of liver and intrahepatic bile duct: Secondary | ICD-10-CM

## 2018-09-01 DIAGNOSIS — R5383 Other fatigue: Secondary | ICD-10-CM

## 2018-09-01 DIAGNOSIS — G620 Drug-induced polyneuropathy: Secondary | ICD-10-CM | POA: Diagnosis not present

## 2018-09-01 DIAGNOSIS — Z79818 Long term (current) use of other agents affecting estrogen receptors and estrogen levels: Secondary | ICD-10-CM | POA: Diagnosis not present

## 2018-09-01 DIAGNOSIS — C7949 Secondary malignant neoplasm of other parts of nervous system: Secondary | ICD-10-CM | POA: Insufficient documentation

## 2018-09-01 DIAGNOSIS — Z170 Estrogen receptor positive status [ER+]: Secondary | ICD-10-CM | POA: Diagnosis not present

## 2018-09-01 DIAGNOSIS — Z79899 Other long term (current) drug therapy: Secondary | ICD-10-CM | POA: Diagnosis not present

## 2018-09-01 DIAGNOSIS — G8929 Other chronic pain: Secondary | ICD-10-CM | POA: Insufficient documentation

## 2018-09-01 DIAGNOSIS — L271 Localized skin eruption due to drugs and medicaments taken internally: Secondary | ICD-10-CM | POA: Insufficient documentation

## 2018-09-01 DIAGNOSIS — R5381 Other malaise: Secondary | ICD-10-CM | POA: Insufficient documentation

## 2018-09-01 DIAGNOSIS — I471 Supraventricular tachycardia: Secondary | ICD-10-CM | POA: Insufficient documentation

## 2018-09-01 DIAGNOSIS — M2550 Pain in unspecified joint: Secondary | ICD-10-CM

## 2018-09-01 DIAGNOSIS — C7931 Secondary malignant neoplasm of brain: Secondary | ICD-10-CM | POA: Insufficient documentation

## 2018-09-01 DIAGNOSIS — Z803 Family history of malignant neoplasm of breast: Secondary | ICD-10-CM | POA: Insufficient documentation

## 2018-09-01 DIAGNOSIS — C50412 Malignant neoplasm of upper-outer quadrant of left female breast: Secondary | ICD-10-CM

## 2018-09-01 DIAGNOSIS — F17210 Nicotine dependence, cigarettes, uncomplicated: Secondary | ICD-10-CM | POA: Diagnosis not present

## 2018-09-01 DIAGNOSIS — L819 Disorder of pigmentation, unspecified: Secondary | ICD-10-CM | POA: Diagnosis not present

## 2018-09-01 DIAGNOSIS — T451X5S Adverse effect of antineoplastic and immunosuppressive drugs, sequela: Secondary | ICD-10-CM | POA: Insufficient documentation

## 2018-09-01 LAB — CBC WITH DIFFERENTIAL/PLATELET
Abs Immature Granulocytes: 0 10*3/uL (ref 0.00–0.07)
BASOS PCT: 1 %
Basophils Absolute: 0 10*3/uL (ref 0.0–0.1)
EOS ABS: 0.2 10*3/uL (ref 0.0–0.5)
EOS PCT: 7 %
HEMATOCRIT: 45.8 % (ref 36.0–46.0)
Hemoglobin: 15.4 g/dL — ABNORMAL HIGH (ref 12.0–15.0)
Immature Granulocytes: 0 %
LYMPHS ABS: 0.9 10*3/uL (ref 0.7–4.0)
Lymphocytes Relative: 29 %
MCH: 33.6 pg (ref 26.0–34.0)
MCHC: 33.6 g/dL (ref 30.0–36.0)
MCV: 99.8 fL (ref 80.0–100.0)
Monocytes Absolute: 0.3 10*3/uL (ref 0.1–1.0)
Monocytes Relative: 9 %
NRBC: 0 % (ref 0.0–0.2)
Neutro Abs: 1.6 10*3/uL — ABNORMAL LOW (ref 1.7–7.7)
Neutrophils Relative %: 54 %
Platelets: 161 10*3/uL (ref 150–400)
RBC: 4.59 MIL/uL (ref 3.87–5.11)
RDW: 16.4 % — AB (ref 11.5–15.5)
WBC: 3 10*3/uL — ABNORMAL LOW (ref 4.0–10.5)

## 2018-09-01 LAB — COMPREHENSIVE METABOLIC PANEL
ALK PHOS: 123 U/L (ref 38–126)
ALT: 20 U/L (ref 0–44)
AST: 31 U/L (ref 15–41)
Albumin: 4.4 g/dL (ref 3.5–5.0)
Anion gap: 9 (ref 5–15)
BILIRUBIN TOTAL: 1.4 mg/dL — AB (ref 0.3–1.2)
BUN: 14 mg/dL (ref 8–23)
CALCIUM: 9 mg/dL (ref 8.9–10.3)
CO2: 26 mmol/L (ref 22–32)
CREATININE: 1.02 mg/dL — AB (ref 0.44–1.00)
Chloride: 104 mmol/L (ref 98–111)
GFR, EST NON AFRICAN AMERICAN: 58 mL/min — AB (ref >60)
Glucose, Bld: 85 mg/dL (ref 70–99)
Potassium: 4.1 mmol/L (ref 3.5–5.1)
SODIUM: 139 mmol/L (ref 135–145)
TOTAL PROTEIN: 8.3 g/dL — AB (ref 6.5–8.1)

## 2018-09-01 NOTE — Progress Notes (Signed)
[facility] OFFICE PROGRESS NOTE  Patient Care Team: Steele Sizer, MD as PCP - General (Family Medicine) Wellington Hampshire, MD as PCP - Cardiology (Cardiology) Bary Castilla, Forest Gleason, MD (General Surgery) [name], MD (Inactive) as Attending Physician (Internal Medicine)  Cancer Staging No matching staging information was found for the patient.   Oncology History   # LEFT BREAST IDC; STAGE II [cT2N1] ER >90%; PR- 50-90%; her 2 NEU- POS; s/p Neoadj chemo; AUG 2016- TCH+P s/p Lumpec & partial ALND- path CR; s/p RT [finished Nov 2016];  adj Herceptin; HELD for Jan 19th 2017 [in DEC 2016-EF dropped from 63 to 51%; FEB 27th EF-42.9%]; JAN 2016 START Arimidex; May 2017- EF- 60%; May 24th 2017-Re-start Herceptin q 3W; July 28th STOP herceptin [finished 44m/m2; sec to Low EF; however 2017 Oct EF= 67%; improved]  # DEC 4th 2017- Start Neratinib 4 pills; DEC 11th 3 pills; STOPPED.  # FEB 2018- RECURRENCE ER/PR positive; Her 2 NEGATIVE [liver Bx]  # MARCH 1st 2018- Tax-Cytoxan [poor tol]  # FEB 2018- Brain mets [SBRT; Dr.Crystal; finished Feb 28th 2018]  # March 2018- Eribulin- poor tolerance  # June 8th 2018- Started Faslodex + Abema [abema-multiple interruptions sec to neutropenia]  # MRI Brain- March 2019- Leptomeningeal mets [WBRT; No LP s/p WBRT- 01/09/2018]; STOP Abema [578mday-sec to severe cytopenia]+faslodex  # MAY 20tth 019- Start Xeloda; STOPPED in Nov 22nd 2019- sec to Progressive liver lesions on CT scan  # NOV 26th 2019- Faslodex + Piqaray-   # PN- G-2; may 2017- Cymbalta 6064m;MARCH 2018-  acute vascular insuff of BIL LE [? Taxotere vasoconstriction on asprin]; # hemorragic shock LGIB [s/p colo; Dr.Wohl]  #  SVT- on flecainide.   # April 2016- Liver Bx- NEG;   # Drop in EF from Herceptin [recovered OCt 27th- EF 65%]  # BMD- jan 2017- osteopenia  -----------------------------------------------------------     [place]-  Sep 4th 2018- PDL-1 0%/NEG for BRCA; MSI-Stable; Positive for PI3K; ccnd-1; FGF** -----------------------------------------------------------  Dx: Metastatic Breast cancer [ER/PR-Pos; her -2 NEG] Stage IV; goals: palliative Current treatment- Faslodex + Piqaray [11/27]     Carcinoma of upper-outer quadrant of left breast in female, estrogen receptor positive [place]    INTERVAL HISTORY:  BreFRONIE HOLSTEIN 71o.  female pleasant patient above history of metastatic breast cancer ER PR positive/leptomeningeal disease metastatic to [place] started on Faslodex plus Piqray is here for follow-up.  Patient started taking the oral pill approximately week ago.  No diarrhea.  No skin rash.  Denies any unusual fatigue nausea vomiting.  Mild fatigue.  Mild chronic joint pain.  No worsening headaches.  Denies any increased frequency of urination  Review of Systems  Constitutional: Positive for malaise/fatigue. Negative for chills, diaphoresis, fever and weight loss.  HENT: Negative for nosebleeds and sore throat.   Eyes: Negative for double vision.  Respiratory: Negative for cough, hemoptysis, sputum production, shortness of breath and wheezing.   Cardiovascular: Negative for chest pain, palpitations, orthopnea and leg swelling.  Gastrointestinal: Negative for abdominal pain, blood in stool, constipation, diarrhea, heartburn, melena, nausea and vomiting.  Genitourinary: Negative for dysuria, frequency and urgency.  Musculoskeletal: Positive for joint pain. Negative for back pain.  Skin: Positive for rash. Negative for itching.  Neurological: Positive for tingling. Negative for dizziness, focal weakness, weakness and headaches.  Endo/Heme/Allergies: Does not bruise/bleed easily.  Psychiatric/Behavioral: Negative for depression. The patient is not nervous/anxious and does not have insomnia.  PAST MEDICAL HISTORY :  Past Medical History:  Diagnosis Date  . Chemotherapy-induced  peripheral neuropathy ([place])   . Epistaxis    a. 11/2016 in setting of asa/plavix-->silver nitrate cauterization.  . GI bleed    a. 11/2016 Admission w/ GIB and hypovolemic shock req 3u PRBC's;  b. 11/2016 ECG: gastritis & nonbleeding peptic ulcer; c. 11/2016 Conlonoscopy: rectal and sigmoid colonic ulcers.  . Heart attack ([place])    a. 1998 Cath @ [facility]: reportedly no intervention required.  Marland Kitchen Herceptin-induced cardiomyopathy ([place])    a. In the setting of Herceptin Rx for breast cancer (initiated 12/2014); b. 03/2015 MUGA EF 64%; b. 08/2015 MUGA: EF 51%; c. 10/2015 MUGA: EF 44%; d. 11/2015 Echo: EF 45-50%; e. 01/2016 MUGA: EF 60%; f. 06/2016 MUGA EF 65%; g. 10/2016 MUGA: EF 61%;  h. 12/2016 Echo: EF 55-60%, gr1 DD.  Marland Kitchen Hyperlipidemia   . Hypertension   . Neuropathy   . Possible PAD (peripheral artery disease) ([place])    a. 11/2016 LE cyanosis and weak pulses-->CTA w/o significant Ao-BiFem dzs. ? distal dzs-->ASA/Plavix initiated by vascular surgery.  Marland Kitchen PSVT (paroxysmal supraventricular tachycardia) ([place])    a. Dx 11/2016.  . Pulmonary embolism ([place])    a. 12/2016 CTA Chest: small nonocclusive PE in inferior segment of the Left lingula, somewhat eccentric filling defect suggesting chronic rather than acute embolic event; b. 09/8561 LE U/S:  No DVT; c. 12/2016 Echo: Nl RV fxn, nl PASP.  Marland Kitchen Recurrent Metastatic breast cancer ([place])    a. Dx 2016: Stage II, ER positive, PR positive, HER-2/neu overexpressing of the left breast-->chemo/radiation; b. 10/2016 CT Abd/pelvis: diffuse liver mets, ill defined sclerotic bone lesions-T12;  c. 10/2016 MRI brain: metastatic lesion along L temporal lobe (19x8m) w/ extensive surrounding edema & 553mmidline shift to right.  . Sinus tachycardia     PAST SURGICAL HISTORY :   Past Surgical History:  Procedure Laterality Date  . BREAST BIOPSY Left 2016   Positive  . BREAST LUMPECTOMY WITH SENTINEL LYMPH NODE BIOPSY Left 05/23/2015   Procedure: LEFT BREAST WIDE EXCISION WITH AXILLARY  DISSECTION, MASTOPLASTY ;  Surgeon: JeRobert BellowMD;  Location: [facility] ORS;  Service: General;  Laterality: Left;  . BREAST SURGERY Left 12/18/14   breast biopsy/INVASIVE DUCTAL CARCINOMA OF BREAST, NOTTINGHAM GRADE 2.  . Marland KitchenREAST SURGERY  05/23/2015.   Wide excision/mastoplasty, axillary dissection. No residual invasive cancer, positive for residual DCIS. 0/2 nodes identified on axillary dissection. (no SLN by technetium or methylene blue)  . CARDIAC CATHETERIZATION    . COLONOSCOPY WITH PROPOFOL N/A 12/10/2016   Procedure: COLONOSCOPY WITH PROPOFOL;  Surgeon: DaLucilla LameMD;  Location: [facility] ENDOSCOPY;  Service: Endoscopy;  Laterality: N/A;  . COLONOSCOPY WITH PROPOFOL N/A 02/13/2017   Procedure: COLONOSCOPY WITH PROPOFOL;  Surgeon: WoLucilla LameMD;  [facility];  Service: Endoscopy;  Laterality: N/A;  . ESOPHAGOGASTRODUODENOSCOPY (EGD) WITH PROPOFOL N/A 12/08/2016   Procedure: ESOPHAGOGASTRODUODENOSCOPY (EGD) WITH PROPOFOL;  Surgeon: DaLucilla LameMD;  Location: [facility] ENDOSCOPY;  Service: Endoscopy;  Laterality: N/A;  . IVC FILTER INSERTION N/A 02/15/2017   Procedure: IVC Filter Insertion;  Surgeon: DeAlgernon HuxleyMD;  [facility];  Service: Cardiovascular;  Laterality: N/A;  . PORTACATH PLACEMENT Right 12-31-14   Dr ByBary Castilla  FAMILY HISTORY :   Family History  Problem Relation Age of Onset  . Breast cancer Maternal Aunt   . Breast cancer Cousin   . Brain cancer Maternal Uncle   .  Diabetes Mother   . Hypertension Mother   . Stroke Mother     SOCIAL HISTORY:   Social History   Tobacco Use  . Smoking status: Light Tobacco Smoker    Packs/day: 0.50    Years: 18.00    Pack years: 9.00    Types: Cigarettes  . Smokeless tobacco: Never Used  Substance Use Topics  . Alcohol use: No    Alcohol/week: 0.0 standard drinks  . Drug use: No    ALLERGIES:  is allergic to no known allergies.  MEDICATIONS:  Current Outpatient Medications  Medication Sig  Dispense Refill  . acetic acid-hydrocortisone (VOSOL-HC) OTIC solution Place 2 drops into both ears daily as needed. 10 mL 0  . Alpelisib (PIQRAY 250MG DAILY DOSE) 200 & 50 MG Therapy Pack Take 250 mg by mouth daily. Take one 225m tablet and one 540mtablet with food at the same time each day. 56 each 4  . flecainide (TAMBOCOR) 50 MG tablet Take 1 tablet (50 mg total) by mouth 2 (two) times daily. 180 tablet 3  . levocetirizine (XYZAL) 5 MG tablet Take 1 tablet (5 mg total) by mouth every evening. 30 tablet 3  . magnesium oxide (MAG-OX) 400 MG tablet TAKE 1 TABLET (400 MG TOTAL) BY MOUTH 2 (TWO) TIMES DAILY. 60 tablet 6  . potassium chloride (KLOR-CON) 20 MEQ packet Take by mouth 2 (two) times daily.    . pregabalin (LYRICA) 75 MG capsule Take 1 capsule (75 mg total) by mouth 2 (two) times daily. 180 capsule 0  . triamcinolone ointment (KENALOG) 0.5 % Apply 1 application topically 2 (two) times daily. 30 g 2  . Vitamin D, Ergocalciferol, (DRISDOL) 50000 units CAPS capsule TAKE 1 CAPSULE (50,000 UNITS TOTAL) ONCE A WEEK BY MOUTH. 12 capsule 1   No current facility-administered medications for this visit.    Facility-Administered Medications Ordered in Other Visits  Medication Dose Route Frequency Provider Last Rate Last Dose  . 0.9 %  sodium chloride infusion   Intravenous Continuous Pandit, Sandeep, MD      . 0.9 %  sodium chloride infusion   Intravenous Continuous FiLloyd HugerMD 999 mL/hr at 04/24/15 1520    . heparin lock flush 100 unit/mL  500 Units Intravenous Once Berenzon, Dmitriy, MD      . sodium chloride 0.9 % injection 10 mL  10 mL Intravenous PRN PaLeia AlfMD   10 mL at 04/04/15 1440  . sodium chloride flush (NS) 0.9 % injection 10 mL  10 mL Intravenous PRN Berenzon, Dmitriy, MD      . Tbo-Filgrastim (GRANIX) injection 480 mcg  480 mcg Subcutaneous Once BrCammie SickleMD        PHYSICAL EXAMINATION: ECOG PERFORMANCE STATUS: 1 - Symptomatic but completely  ambulatory  BP (!) 141/80 (BP Location: Left Arm, Patient Position: Sitting)   Pulse (!) 103   Resp 16   Wt 155 lb 12.8 oz (70.7 kg)   BMI 25.15 kg/m   Filed Weights   09/01/18 0927  Weight: 155 lb 12.8 oz (70.7 kg)    Physical Exam  Constitutional: She is oriented to person, place, and time and well-developed, well-nourished, and in no distress.  Alone.  Walking herself.  HENT:  Head: Normocephalic and atraumatic.  Mouth/Throat: Oropharynx is clear and moist. No oropharyngeal exudate.  Eyes: Pupils are equal, round, and reactive to light.  Neck: Normal range of motion. Neck supple.  Cardiovascular: Normal rate and regular rhythm.  Pulmonary/Chest: No  respiratory distress. She has no wheezes.  Abdominal: Soft. Bowel sounds are normal. She exhibits no distension and no mass. There is no tenderness. There is no rebound and no guarding.  Musculoskeletal: Normal range of motion. She exhibits no edema or tenderness.  Neurological: She is alert and oriented to person, place, and time.  Skin: Skin is warm.  Significant hyperpigmentation of the bilateral hand and feet.  No desquamation.  Psychiatric: Affect normal.       LABORATORY DATA:  I have reviewed the data as listed    Component Value Date/Time   NA 139 09/01/2018 0857   NA 135 03/04/2017 0918   NA 135 01/08/2015 0850   K 4.1 09/01/2018 0857   K 3.7 01/08/2015 0850   CL 104 09/01/2018 0857   CL 101 01/08/2015 0850   CO2 26 09/01/2018 0857   CO2 26 01/08/2015 0850   GLUCOSE 85 09/01/2018 0857   GLUCOSE 161 (H) 01/08/2015 0850   BUN 14 09/01/2018 0857   BUN 8 03/04/2017 0918   BUN 17 01/08/2015 0850   CREATININE 1.02 (H) 09/01/2018 0857   CREATININE 0.82 01/22/2015 1559   CALCIUM 9.0 09/01/2018 0857   CALCIUM 9.3 01/08/2015 0850   PROT 8.3 (H) 09/01/2018 0857   PROT 6.4 03/04/2017 0918   PROT 6.8 01/22/2015 1559   ALBUMIN 4.4 09/01/2018 0857   ALBUMIN 2.5 (L) 03/04/2017 0918   ALBUMIN 3.9 01/22/2015 1559    AST 31 09/01/2018 0857   AST 34 01/22/2015 1559   ALT 20 09/01/2018 0857   ALT 42 01/22/2015 1559   ALKPHOS 123 09/01/2018 0857   ALKPHOS 157 (H) 01/22/2015 1559   BILITOT 1.4 (H) 09/01/2018 0857   BILITOT 6.2 (H) 03/04/2017 0918   BILITOT 0.2 (L) 01/22/2015 1559   GFRNONAA 58 (L) 09/01/2018 0857   GFRNONAA >60 01/22/2015 1559   GFRAA >60 09/01/2018 0857   GFRAA >60 01/22/2015 1559    No results found for: SPEP, UPEP  Lab Results  Component Value Date   WBC 3.0 (L) 09/01/2018   NEUTROABS 1.6 (L) 09/01/2018   HGB 15.4 (H) 09/01/2018   HCT 45.8 09/01/2018   MCV 99.8 09/01/2018   PLT 161 09/01/2018      Chemistry      Component Value Date/Time   NA 139 09/01/2018 0857   NA 135 03/04/2017 0918   NA 135 01/08/2015 0850   K 4.1 09/01/2018 0857   K 3.7 01/08/2015 0850   CL 104 09/01/2018 0857   CL 101 01/08/2015 0850   CO2 26 09/01/2018 0857   CO2 26 01/08/2015 0850   BUN 14 09/01/2018 0857   BUN 8 03/04/2017 0918   BUN 17 01/08/2015 0850   CREATININE 1.02 (H) 09/01/2018 0857   CREATININE 0.82 01/22/2015 1559      Component Value Date/Time   CALCIUM 9.0 09/01/2018 0857   CALCIUM 9.3 01/08/2015 0850   ALKPHOS 123 09/01/2018 0857   ALKPHOS 157 (H) 01/22/2015 1559   AST 31 09/01/2018 0857   AST 34 01/22/2015 1559   ALT 20 09/01/2018 0857   ALT 42 01/22/2015 1559   BILITOT 1.4 (H) 09/01/2018 0857   BILITOT 6.2 (H) 03/04/2017 0918   BILITOT 0.2 (L) 01/22/2015 1559       RADIOGRAPHIC STUDIES: I have personally reviewed the radiological images as listed and agreed with the findings in the report. No results found.   ASSESSMENT & PLAN:  Carcinoma of upper-outer quadrant of left breast in female, estrogen  receptor positive ([place]) # Recurrent metastatic breast cancer-ER PR positive HER-2 negative; November 14 CT scan shows progressive liver lesions.  Currently on alpelisib 282m/day [starting 11/27]+ faslodex.  # continue current therapy. Labs today reviewed;   acceptable for treatment. FBG- 85. Await HbA1c. Will repeat CT scan in mid-late Geb 2019.   # hand foot syndrome-/hyperpigmentation of hand and feet- G-1 continue kenalog; urea 20%; vaseline bid.  Stable.  # Brain metastases-recurrent/likely leptomeningeal disease;  S/p WBRT [April 2019]. STABLE. NOv 19th MRI stable-no progressive leptomeningeal disease..Marland Kitchen #Peripheral neuropathy grade 1-  on Lyrica; stable.   # Hypokalemia- K today- 4.1- Hold off Kcl at this time.   # DISPOSITION: # labs- cbc/bmp in 1 week # follow up on 12/23- MD/labs- cbc/cmp/Faslodex- Dr.B  # I reviewed the blood work- with the patient in detail; also reviewed the imaging independently [as summarized above]; and with the patient in detail.      No orders of the defined types were placed in this encounter.  All questions were answered. The patient knows to call the clinic with any problems, questions or concerns.      GCammie Sickle MD 09/01/2018 9:44 AM

## 2018-09-01 NOTE — Assessment & Plan Note (Addendum)
#  Recurrent metastatic breast cancer-ER PR positive HER-2 negative; November 14 CT scan shows progressive liver lesions.  Currently on alpelisib '250mg'$ /day [starting 11/27]+ faslodex.  # continue current therapy. Labs today reviewed;  acceptable for treatment. FBG- 85. Await HbA1c. Will repeat CT scan in mid-late Geb 2019.   # hand foot syndrome-/hyperpigmentation of hand and feet- G-1 continue kenalog; urea 20%; vaseline bid.  Stable.  # Brain metastases-recurrent/likely leptomeningeal disease;  S/p WBRT [April 2019]. STABLE. NOv 19th MRI stable-no progressive leptomeningeal disease.Marland Kitchen  #Peripheral neuropathy grade 1-  on Lyrica; stable.   # Hypokalemia- K today- 4.1- Hold off Kcl at this time.   # DISPOSITION: # labs- cbc/bmp in 1 week # follow up on 12/23- MD/labs- cbc/cmp/Faslodex- Dr.B  # I reviewed the blood work- with the patient in detail; also reviewed the imaging independently [as summarized above]; and with the patient in detail.

## 2018-09-02 LAB — HEMOGLOBIN A1C
Hgb A1c MFr Bld: 5.2 % (ref 4.8–5.6)
Mean Plasma Glucose: 102.54 mg/dL

## 2018-09-05 ENCOUNTER — Other Ambulatory Visit: Payer: Self-pay

## 2018-09-05 DIAGNOSIS — C50412 Malignant neoplasm of upper-outer quadrant of left female breast: Secondary | ICD-10-CM

## 2018-09-05 DIAGNOSIS — Z170 Estrogen receptor positive status [ER+]: Principal | ICD-10-CM

## 2018-09-08 ENCOUNTER — Other Ambulatory Visit: Payer: Self-pay

## 2018-09-08 ENCOUNTER — Inpatient Hospital Stay: Payer: BLUE CROSS/BLUE SHIELD

## 2018-09-08 DIAGNOSIS — C50412 Malignant neoplasm of upper-outer quadrant of left female breast: Secondary | ICD-10-CM | POA: Diagnosis not present

## 2018-09-08 DIAGNOSIS — Z79818 Long term (current) use of other agents affecting estrogen receptors and estrogen levels: Secondary | ICD-10-CM | POA: Diagnosis not present

## 2018-09-08 DIAGNOSIS — T451X5S Adverse effect of antineoplastic and immunosuppressive drugs, sequela: Secondary | ICD-10-CM | POA: Diagnosis not present

## 2018-09-08 DIAGNOSIS — L819 Disorder of pigmentation, unspecified: Secondary | ICD-10-CM | POA: Diagnosis not present

## 2018-09-08 DIAGNOSIS — R5383 Other fatigue: Secondary | ICD-10-CM | POA: Diagnosis not present

## 2018-09-08 DIAGNOSIS — Z95828 Presence of other vascular implants and grafts: Secondary | ICD-10-CM

## 2018-09-08 DIAGNOSIS — Z170 Estrogen receptor positive status [ER+]: Principal | ICD-10-CM

## 2018-09-08 DIAGNOSIS — G8929 Other chronic pain: Secondary | ICD-10-CM | POA: Diagnosis not present

## 2018-09-08 DIAGNOSIS — R5381 Other malaise: Secondary | ICD-10-CM | POA: Diagnosis not present

## 2018-09-08 DIAGNOSIS — G620 Drug-induced polyneuropathy: Secondary | ICD-10-CM | POA: Diagnosis not present

## 2018-09-08 DIAGNOSIS — C787 Secondary malignant neoplasm of liver and intrahepatic bile duct: Secondary | ICD-10-CM | POA: Diagnosis not present

## 2018-09-08 DIAGNOSIS — M8580 Other specified disorders of bone density and structure, unspecified site: Secondary | ICD-10-CM | POA: Diagnosis not present

## 2018-09-08 DIAGNOSIS — C7949 Secondary malignant neoplasm of other parts of nervous system: Secondary | ICD-10-CM | POA: Diagnosis not present

## 2018-09-08 DIAGNOSIS — E876 Hypokalemia: Secondary | ICD-10-CM | POA: Diagnosis not present

## 2018-09-08 DIAGNOSIS — M2550 Pain in unspecified joint: Secondary | ICD-10-CM | POA: Diagnosis not present

## 2018-09-08 DIAGNOSIS — L271 Localized skin eruption due to drugs and medicaments taken internally: Secondary | ICD-10-CM | POA: Diagnosis not present

## 2018-09-08 DIAGNOSIS — C7931 Secondary malignant neoplasm of brain: Secondary | ICD-10-CM | POA: Diagnosis not present

## 2018-09-08 LAB — CBC WITH DIFFERENTIAL/PLATELET
ABS IMMATURE GRANULOCYTES: 0.01 10*3/uL (ref 0.00–0.07)
BASOS ABS: 0 10*3/uL (ref 0.0–0.1)
BASOS PCT: 1 %
Eosinophils Absolute: 0.2 10*3/uL (ref 0.0–0.5)
Eosinophils Relative: 4 %
HEMATOCRIT: 45.1 % (ref 36.0–46.0)
Hemoglobin: 15 g/dL (ref 12.0–15.0)
IMMATURE GRANULOCYTES: 0 %
LYMPHS ABS: 1 10*3/uL (ref 0.7–4.0)
Lymphocytes Relative: 23 %
MCH: 32.9 pg (ref 26.0–34.0)
MCHC: 33.3 g/dL (ref 30.0–36.0)
MCV: 98.9 fL (ref 80.0–100.0)
Monocytes Absolute: 0.3 10*3/uL (ref 0.1–1.0)
Monocytes Relative: 7 %
NEUTROS PCT: 65 %
Neutro Abs: 3 10*3/uL (ref 1.7–7.7)
Platelets: 143 10*3/uL — ABNORMAL LOW (ref 150–400)
RBC: 4.56 MIL/uL (ref 3.87–5.11)
RDW: 16.2 % — ABNORMAL HIGH (ref 11.5–15.5)
WBC: 4.6 10*3/uL (ref 4.0–10.5)
nRBC: 0 % (ref 0.0–0.2)

## 2018-09-08 LAB — COMPREHENSIVE METABOLIC PANEL
ALK PHOS: 136 U/L — AB (ref 38–126)
ALT: 24 U/L (ref 0–44)
ANION GAP: 7 (ref 5–15)
AST: 32 U/L (ref 15–41)
Albumin: 4.1 g/dL (ref 3.5–5.0)
BILIRUBIN TOTAL: 1.1 mg/dL (ref 0.3–1.2)
BUN: 11 mg/dL (ref 8–23)
CALCIUM: 8.9 mg/dL (ref 8.9–10.3)
CHLORIDE: 105 mmol/L (ref 98–111)
CO2: 27 mmol/L (ref 22–32)
CREATININE: 0.86 mg/dL (ref 0.44–1.00)
GFR calc Af Amer: >60 mL/min (ref >60)
GFR calc non Af Amer: >60 mL/min (ref >60)
GLUCOSE: 87 mg/dL (ref 70–99)
Potassium: 3.6 mmol/L (ref 3.5–5.1)
Sodium: 139 mmol/L (ref 135–145)
Total Protein: 7.7 g/dL (ref 6.5–8.1)

## 2018-09-08 MED ORDER — HEPARIN SOD (PORK) LOCK FLUSH 100 UNIT/ML IV SOLN
500.0000 [IU] | Freq: Once | INTRAVENOUS | Status: AC
  Administered 2018-09-08: 500 [IU] via INTRAVENOUS

## 2018-09-08 MED ORDER — SODIUM CHLORIDE 0.9% FLUSH
10.0000 mL | Freq: Once | INTRAVENOUS | Status: AC
  Administered 2018-09-08: 10 mL via INTRAVENOUS
  Filled 2018-09-08: qty 10

## 2018-09-09 LAB — CANCER ANTIGEN 27.29: CA 27.29: 20.7 U/mL (ref 0.0–38.6)

## 2018-09-10 ENCOUNTER — Other Ambulatory Visit: Payer: Self-pay | Admitting: Internal Medicine

## 2018-09-10 DIAGNOSIS — Z170 Estrogen receptor positive status [ER+]: Principal | ICD-10-CM

## 2018-09-10 DIAGNOSIS — C50412 Malignant neoplasm of upper-outer quadrant of left female breast: Secondary | ICD-10-CM

## 2018-09-12 NOTE — Telephone Encounter (Signed)
...     Ref Range & Units 4d ago  Potassium 3.5 - 5.1 mmol/L 3.6

## 2018-09-15 MED FILL — PIQRAY (250 MG DAILY DOSE): 200 & 50 | 28 days supply | Qty: 56 | Fill #1

## 2018-09-19 ENCOUNTER — Inpatient Hospital Stay: Payer: BLUE CROSS/BLUE SHIELD

## 2018-09-19 ENCOUNTER — Encounter: Payer: Self-pay | Admitting: Internal Medicine

## 2018-09-19 ENCOUNTER — Other Ambulatory Visit: Payer: Self-pay | Admitting: *Deleted

## 2018-09-19 ENCOUNTER — Inpatient Hospital Stay (HOSPITAL_BASED_OUTPATIENT_CLINIC_OR_DEPARTMENT_OTHER): Payer: BLUE CROSS/BLUE SHIELD | Admitting: Internal Medicine

## 2018-09-19 ENCOUNTER — Other Ambulatory Visit: Payer: Self-pay

## 2018-09-19 VITALS — BP 129/85 | HR 88 | Temp 97.8°F | Resp 16 | Wt 158.0 lb

## 2018-09-19 DIAGNOSIS — E860 Dehydration: Secondary | ICD-10-CM

## 2018-09-19 DIAGNOSIS — R5383 Other fatigue: Secondary | ICD-10-CM | POA: Diagnosis not present

## 2018-09-19 DIAGNOSIS — C50412 Malignant neoplasm of upper-outer quadrant of left female breast: Secondary | ICD-10-CM | POA: Diagnosis not present

## 2018-09-19 DIAGNOSIS — M2550 Pain in unspecified joint: Secondary | ICD-10-CM | POA: Diagnosis not present

## 2018-09-19 DIAGNOSIS — Z170 Estrogen receptor positive status [ER+]: Secondary | ICD-10-CM

## 2018-09-19 DIAGNOSIS — I252 Old myocardial infarction: Secondary | ICD-10-CM

## 2018-09-19 DIAGNOSIS — G8929 Other chronic pain: Secondary | ICD-10-CM | POA: Diagnosis not present

## 2018-09-19 DIAGNOSIS — E876 Hypokalemia: Secondary | ICD-10-CM | POA: Diagnosis not present

## 2018-09-19 DIAGNOSIS — R5381 Other malaise: Secondary | ICD-10-CM

## 2018-09-19 DIAGNOSIS — C7949 Secondary malignant neoplasm of other parts of nervous system: Secondary | ICD-10-CM | POA: Diagnosis not present

## 2018-09-19 DIAGNOSIS — Z79899 Other long term (current) drug therapy: Secondary | ICD-10-CM

## 2018-09-19 DIAGNOSIS — L271 Localized skin eruption due to drugs and medicaments taken internally: Secondary | ICD-10-CM | POA: Diagnosis not present

## 2018-09-19 DIAGNOSIS — M8580 Other specified disorders of bone density and structure, unspecified site: Secondary | ICD-10-CM | POA: Diagnosis not present

## 2018-09-19 DIAGNOSIS — G620 Drug-induced polyneuropathy: Secondary | ICD-10-CM

## 2018-09-19 DIAGNOSIS — C787 Secondary malignant neoplasm of liver and intrahepatic bile duct: Secondary | ICD-10-CM

## 2018-09-19 DIAGNOSIS — Z803 Family history of malignant neoplasm of breast: Secondary | ICD-10-CM

## 2018-09-19 DIAGNOSIS — T451X5S Adverse effect of antineoplastic and immunosuppressive drugs, sequela: Secondary | ICD-10-CM | POA: Diagnosis not present

## 2018-09-19 DIAGNOSIS — C7931 Secondary malignant neoplasm of brain: Secondary | ICD-10-CM | POA: Diagnosis not present

## 2018-09-19 DIAGNOSIS — L819 Disorder of pigmentation, unspecified: Secondary | ICD-10-CM

## 2018-09-19 DIAGNOSIS — Z79818 Long term (current) use of other agents affecting estrogen receptors and estrogen levels: Secondary | ICD-10-CM | POA: Diagnosis not present

## 2018-09-19 DIAGNOSIS — F17210 Nicotine dependence, cigarettes, uncomplicated: Secondary | ICD-10-CM

## 2018-09-19 LAB — CBC WITH DIFFERENTIAL/PLATELET
Abs Immature Granulocytes: 0 10*3/uL (ref 0.00–0.07)
Basophils Absolute: 0 10*3/uL (ref 0.0–0.1)
Basophils Relative: 1 %
Eosinophils Absolute: 0.1 10*3/uL (ref 0.0–0.5)
Eosinophils Relative: 3 %
HCT: 44.7 % (ref 36.0–46.0)
Hemoglobin: 14.4 g/dL (ref 12.0–15.0)
IMMATURE GRANULOCYTES: 0 %
Lymphocytes Relative: 29 %
Lymphs Abs: 1 10*3/uL (ref 0.7–4.0)
MCH: 32.5 pg (ref 26.0–34.0)
MCHC: 32.2 g/dL (ref 30.0–36.0)
MCV: 100.9 fL — ABNORMAL HIGH (ref 80.0–100.0)
Monocytes Absolute: 0.3 10*3/uL (ref 0.1–1.0)
Monocytes Relative: 9 %
Neutro Abs: 2 10*3/uL (ref 1.7–7.7)
Neutrophils Relative %: 58 %
Platelets: 141 10*3/uL — ABNORMAL LOW (ref 150–400)
RBC: 4.43 MIL/uL (ref 3.87–5.11)
RDW: 15.7 % — ABNORMAL HIGH (ref 11.5–15.5)
WBC: 3.4 10*3/uL — ABNORMAL LOW (ref 4.0–10.5)
nRBC: 0 % (ref 0.0–0.2)

## 2018-09-19 LAB — COMPREHENSIVE METABOLIC PANEL
ALT: 26 U/L (ref 0–44)
ANION GAP: 10 (ref 5–15)
AST: 37 U/L (ref 15–41)
Albumin: 4 g/dL (ref 3.5–5.0)
Alkaline Phosphatase: 130 U/L — ABNORMAL HIGH (ref 38–126)
BUN: 14 mg/dL (ref 8–23)
CO2: 26 mmol/L (ref 22–32)
Calcium: 9.1 mg/dL (ref 8.9–10.3)
Chloride: 106 mmol/L (ref 98–111)
Creatinine, Ser: 0.8 mg/dL (ref 0.44–1.00)
GFR calc Af Amer: >60 mL/min (ref >60)
GFR calc non Af Amer: >60 mL/min (ref >60)
Glucose, Bld: 70 mg/dL (ref 70–99)
Potassium: 3.4 mmol/L — ABNORMAL LOW (ref 3.5–5.1)
SODIUM: 142 mmol/L (ref 135–145)
Total Bilirubin: 1.3 mg/dL — ABNORMAL HIGH (ref 0.3–1.2)
Total Protein: 8 g/dL (ref 6.5–8.1)

## 2018-09-19 MED ORDER — FULVESTRANT 250 MG/5ML IM SOLN
500.0000 mg | Freq: Once | INTRAMUSCULAR | Status: AC
  Administered 2018-09-19: 500 mg via INTRAMUSCULAR
  Filled 2018-09-19: qty 10

## 2018-09-19 NOTE — Progress Notes (Signed)
Confirmed with Dr. Rogue Bussing nurse that patient is to receive Faslodex only today (not getting Granix)

## 2018-09-19 NOTE — Assessment & Plan Note (Addendum)
#  Recurrent metastatic breast cancer-ER PR positive HER-2 negative; November 14 CT scan shows progressive liver lesions.  Currently on alpelisib '250mg'$ /day [starting 11/27]+ faslodex.  # continue current therapy. Labs today reviewed;  acceptable for treatment. FBG- 70; this AM. tolearting well- will plan standrad dose of 300 mg/day with next refill.   # hand foot syndrome-/hyperpigmentation of hand and feet- G-1 continue kenalog; urea 20%; vaseline bid. Improving.  # Brain metastases-recurrent/likely leptomeningeal disease;  S/p WBRT [April 2019]. NOv 19th MRI stable-no progressive leptomeningeal disease. STABLE.   #Peripheral neuropathy grade 1-  on Lyrica; stable.   # DISPOSITION: # shots today # follow up in 2 weeks- MD/labs- cbc/cmp/ca-27-29; cea Dr.B  Cc; [place].

## 2018-09-19 NOTE — Progress Notes (Signed)
[place] OFFICE PROGRESS NOTE  Patient Care Team: Steele Sizer, MD as PCP - General (Family Medicine) Wellington Hampshire, MD as PCP - Cardiology (Cardiology) Bary Castilla, Forest Gleason, MD (General Surgery) [name], MD (Inactive) as Attending Physician (Internal Medicine)  Cancer Staging No matching staging information was found for the patient.   Oncology History   # LEFT BREAST IDC; STAGE II [cT2N1] ER >90%; PR- 50-90%; her 2 NEU- POS; s/p Neoadj chemo; AUG 2016- TCH+P s/p Lumpec & partial ALND- path CR; s/p RT [finished Nov 2016];  adj Herceptin; HELD for Jan 19th 2017 [in DEC 2016-EF dropped from 63 to 51%; FEB 27th EF-42.9%]; JAN 2016 START Arimidex; May 2017- EF- 60%; May 24th 2017-Re-start Herceptin q 3W; July 28th STOP herceptin [finished 5m/m2; sec to Low EF; however 2017 Oct EF= 67%; improved]  # DEC 4th 2017- Start Neratinib 4 pills; DEC 11th 3 pills; STOPPED.  # FEB 2018- RECURRENCE ER/PR positive; Her 2 NEGATIVE [liver Bx]  # MARCH 1st 2018- Tax-Cytoxan [poor tol]  # FEB 2018- Brain mets [SBRT; Dr.Crystal; finished Feb 28th 2018]  # March 2018- Eribulin- poor tolerance  # June 8th 2018- Started Faslodex + Abema [abema-multiple interruptions sec to neutropenia]  # MRI Brain- March 2019- Leptomeningeal mets [WBRT; No LP s/p WBRT- 01/09/2018]; STOP Abema [540mday-sec to severe cytopenia]+faslodex  # MAY 20tth 019- Start Xeloda; STOPPED in Nov 22nd 2019- sec to Progressive liver lesions on CT scan  # NOV 26th 2019- Faslodex + Piqaray-   # PN- G-2; may 2017- Cymbalta 6022m;MARCH 2018-  acute vascular insuff of BIL LE [? Taxotere vasoconstriction on asprin]; # hemorragic shock LGIB [s/p colo; Dr.Wohl]  #  SVT- on flecainide.   # April 2016- Liver Bx- NEG;   # Drop in EF from Herceptin [recovered OCt 27th- EF 65%]  # BMD- jan 2017- osteopenia  -----------------------------------------------------------     [place]-  Sep 4th 2018- PDL-1 0%/NEG for BRCA; MSI-Stable; Positive for PI3K; ccnd-1; FGF** -----------------------------------------------------------  Dx: Metastatic Breast cancer [ER/PR-Pos; her -2 NEG] Stage IV; goals: palliative Current treatment- Faslodex + Piqaray [11/27]     Carcinoma of upper-outer quadrant of left breast in female, estrogen receptor positive ([place]    INTERVAL HISTORY:  [name] 46o.  female pleasant patient above history of metastatic breast cancer ER PR positive/leptomeningeal disease metastatic to bra[place]rawfordcently started on Faslodex plus Piqray is here for follow-up.  Patient continues to be on Piqray-denies any diarrhea.  Denies any nausea vomiting.  Appetite is good.  No fevers or chills.  No skin rash.  The darkish discoloration on the hands and soles improving.   Review of Systems  Constitutional: Positive for malaise/fatigue. Negative for chills, diaphoresis, fever and weight loss.  HENT: Negative for nosebleeds and sore throat.   Eyes: Negative for double vision.  Respiratory: Negative for cough, hemoptysis, sputum production, shortness of breath and wheezing.   Cardiovascular: Negative for chest pain, palpitations, orthopnea and leg swelling.  Gastrointestinal: Negative for abdominal pain, blood in stool, constipation, diarrhea, heartburn, melena, nausea and vomiting.  Genitourinary: Negative for dysuria, frequency and urgency.  Musculoskeletal: Positive for joint pain. Negative for back pain.  Skin: Positive for rash. Negative for itching.  Neurological: Positive for tingling. Negative for dizziness, focal weakness, weakness and headaches.  Endo/Heme/Allergies: Does not bruise/bleed easily.  Psychiatric/Behavioral: Negative for depression. The patient is not nervous/anxious and does not have insomnia.       PAST  MEDICAL HISTORY :  Past Medical History:  Diagnosis Date  . Chemotherapy-induced peripheral neuropathy ([place])   . Epistaxis     a. 11/2016 in setting of asa/plavix-->silver nitrate cauterization.  . GI bleed    a. 11/2016 Admission w/ GIB and hypovolemic shock req 3u PRBC's;  b. 11/2016 ECG: gastritis & nonbleeding peptic ulcer; c. 11/2016 Conlonoscopy: rectal and sigmoid colonic ulcers.  . Heart attack ([place])    a. 1998 Cath @ [facility]: reportedly no intervention required.  Marland Kitchen Herceptin-induced cardiomyopathy ([place])    a. In the setting of Herceptin Rx for breast cancer (initiated 12/2014); b. 03/2015 MUGA EF 64%; b. 08/2015 MUGA: EF 51%; c. 10/2015 MUGA: EF 44%; d. 11/2015 Echo: EF 45-50%; e. 01/2016 MUGA: EF 60%; f. 06/2016 MUGA EF 65%; g. 10/2016 MUGA: EF 61%;  h. 12/2016 Echo: EF 55-60%, gr1 DD.  Marland Kitchen Hyperlipidemia   . Hypertension   . Neuropathy   . Possible PAD (peripheral artery disease) ([place])    a. 11/2016 LE cyanosis and weak pulses-->CTA w/o significant Ao-BiFem dzs. ? distal dzs-->ASA/Plavix initiated by vascular surgery.  Marland Kitchen PSVT (paroxysmal supraventricular tachycardia) ([place])    a. Dx 11/2016.  . Pulmonary embolism ([place])    a. 12/2016 CTA Chest: small nonocclusive PE in inferior segment of the Left lingula, somewhat eccentric filling defect suggesting chronic rather than acute embolic event; b. 05/3715 LE U/S:  No DVT; c. 12/2016 Echo: Nl RV fxn, nl PASP.  Marland Kitchen Recurrent Metastatic breast cancer ([place])    a. Dx 2016: Stage II, ER positive, PR positive, HER-2/neu overexpressing of the left breast-->chemo/radiation; b. 10/2016 CT Abd/pelvis: diffuse liver mets, ill defined sclerotic bone lesions-T12;  c. 10/2016 MRI brain: metastatic lesion along L temporal lobe (19x38m) w/ extensive surrounding edema & 586mmidline shift to right.  . Sinus tachycardia     PAST SURGICAL HISTORY :   Past Surgical History:  Procedure Laterality Date  . BREAST BIOPSY Left 2016   Positive  . BREAST LUMPECTOMY WITH SENTINEL LYMPH NODE BIOPSY Left 05/23/2015   Procedure: LEFT BREAST WIDE EXCISION WITH AXILLARY DISSECTION, MASTOPLASTY ;  Surgeon: JeRobert BellowMD;  Location: [facility] ORS;  Service: General;  Laterality: Left;  . BREAST SURGERY Left 12/18/14   breast biopsy/INVASIVE DUCTAL CARCINOMA OF BREAST, NOTTINGHAM GRADE 2.  . Marland KitchenREAST SURGERY  05/23/2015.   Wide excision/mastoplasty, axillary dissection. No residual invasive cancer, positive for residual DCIS. 0/2 nodes identified on axillary dissection. (no SLN by technetium or methylene blue)  . CARDIAC CATHETERIZATION    . COLONOSCOPY WITH PROPOFOL N/A 12/10/2016   Procedure: COLONOSCOPY WITH PROPOFOL;  Surgeon: DaLucilla LameMD;  Location: [facility] ENDOSCOPY;  Service: Endoscopy;  Laterality: N/A;  . COLONOSCOPY WITH PROPOFOL N/A 02/13/2017   Procedure: COLONOSCOPY WITH PROPOFOL;  Surgeon: WoLucilla LameMD;  [facility];  Service: Endoscopy;  Laterality: N/A;  . ESOPHAGOGASTRODUODENOSCOPY (EGD) WITH PROPOFOL N/A 12/08/2016   Procedure: ESOPHAGOGASTRODUODENOSCOPY (EGD) WITH PROPOFOL;  Surgeon: DaLucilla LameMD;  Location: [facility] END[facility]COPY;  Service: Endoscopy;  Laterality: N/A;  . IVC FILTER INSERTION N/A 02/15/2017   Procedure: IVC Filter Insertion;  Surgeon: DeAlgernon HuxleyMD;  [facility];  Service: Cardiovascular;  Laterality: N/A;  . PORTACATH PLACEMENT Right 12-31-14   Dr ByBary Castilla  FAMILY HISTORY :   Family History  Problem Relation Age of Onset  . Breast cancer Maternal Aunt   . Breast cancer Cousin   . Brain cancer Maternal Uncle   . Diabetes  Mother   . Hypertension Mother   . Stroke Mother     SOCIAL HISTORY:   Social History   Tobacco Use  . Smoking status: Light Tobacco Smoker    Packs/day: 0.50    Years: 18.00    Pack years: 9.00    Types: Cigarettes  . Smokeless tobacco: Never Used  Substance Use Topics  . Alcohol use: No    Alcohol/week: 0.0 standard drinks  . Drug use: No    ALLERGIES:  is allergic to no known allergies.  MEDICATIONS:  Current Outpatient Medications  Medication Sig Dispense Refill  . acetic acid-hydrocortisone  (VOSOL-HC) OTIC solution Place 2 drops into both ears daily as needed. 10 mL 0  . Alpelisib (PIQRAY 250MG DAILY DOSE) 200 & 50 MG Therapy Pack Take 250 mg by mouth daily. Take one 238m tablet and one 575mtablet with food at the same time each day. 56 each 4  . flecainide (TAMBOCOR) 50 MG tablet Take 1 tablet (50 mg total) by mouth 2 (two) times daily. 180 tablet 3  . KLOR-CON M20 20 MEQ tablet TAKE ONE TABLE BY MOUTH TWICE A DAY 180 tablet 1  . levocetirizine (XYZAL) 5 MG tablet Take 1 tablet (5 mg total) by mouth every evening. 30 tablet 3  . magnesium oxide (MAG-OX) 400 MG tablet TAKE 1 TABLET (400 MG TOTAL) BY MOUTH 2 (TWO) TIMES DAILY. 60 tablet 6  . potassium chloride (KLOR-CON) 20 MEQ packet Take by mouth 2 (two) times daily.    . pregabalin (LYRICA) 75 MG capsule Take 1 capsule (75 mg total) by mouth 2 (two) times daily. 180 capsule 0  . triamcinolone ointment (KENALOG) 0.5 % Apply 1 application topically 2 (two) times daily. 30 g 2  . Vitamin D, Ergocalciferol, (DRISDOL) 50000 units CAPS capsule TAKE 1 CAPSULE (50,000 UNITS TOTAL) ONCE A WEEK BY MOUTH. 12 capsule 1   No current facility-administered medications for this visit.    Facility-Administered Medications Ordered in Other Visits  Medication Dose Route Frequency Provider Last Rate Last Dose  . 0.9 %  sodium chloride infusion   Intravenous Continuous Pandit, Sandeep, MD      . 0.9 %  sodium chloride infusion   Intravenous Continuous FiLloyd HugerMD 999 mL/hr at 04/24/15 1520    . heparin lock flush 100 unit/mL  500 Units Intravenous Once Berenzon, Dmitriy, MD      . sodium chloride 0.9 % injection 10 mL  10 mL Intravenous PRN PaLeia AlfMD   10 mL at 04/04/15 1440  . sodium chloride flush (NS) 0.9 % injection 10 mL  10 mL Intravenous PRN Berenzon, Dmitriy, MD      . Tbo-Filgrastim (GRANIX) injection 480 mcg  480 mcg Subcutaneous Once BrCammie SickleMD        PHYSICAL EXAMINATION: ECOG PERFORMANCE STATUS:  1 - Symptomatic but completely ambulatory  BP 129/85 (BP Location: Left Arm, Patient Position: Sitting)   Pulse 88   Temp 97.8 F (36.6 C) (Tympanic)   Resp 16   Wt 158 lb (71.7 kg)   BMI 25.50 kg/m   Filed Weights   09/19/18 0952  Weight: 158 lb (71.7 kg)    Physical Exam  Constitutional: She is oriented to person, place, and time and well-developed, well-nourished, and in no distress.  Alone.  Walking herself.  HENT:  Head: Normocephalic and atraumatic.  Mouth/Throat: Oropharynx is clear and moist. No oropharyngeal exudate.  Eyes: Pupils are equal, round, and reactive to  light.  Neck: Normal range of motion. Neck supple.  Cardiovascular: Normal rate and regular rhythm.  Pulmonary/Chest: No respiratory distress. She has no wheezes.  Abdominal: Soft. Bowel sounds are normal. She exhibits no distension and no mass. There is no abdominal tenderness. There is no rebound and no guarding.  Musculoskeletal: Normal range of motion.        General: No tenderness or edema.  Neurological: She is alert and oriented to person, place, and time.  Skin: Skin is warm.  Significant hyperpigmentation of the bilateral hand and feet.  No desquamation.  Psychiatric: Affect normal.       LABORATORY DATA:  I have reviewed the data as listed    Component Value Date/Time   NA 142 09/19/2018 0924   NA 135 03/04/2017 0918   NA 135 01/08/2015 0850   K 3.4 (L) 09/19/2018 0924   K 3.7 01/08/2015 0850   CL 106 09/19/2018 0924   CL 101 01/08/2015 0850   CO2 26 09/19/2018 0924   CO2 26 01/08/2015 0850   GLUCOSE 70 09/19/2018 0924   GLUCOSE 161 (H) 01/08/2015 0850   BUN 14 09/19/2018 0924   BUN 8 03/04/2017 0918   BUN 17 01/08/2015 0850   CREATININE 0.80 09/19/2018 0924   CREATININE 0.82 01/22/2015 1559   CALCIUM 9.1 09/19/2018 0924   CALCIUM 9.3 01/08/2015 0850   PROT 8.0 09/19/2018 0924   PROT 6.4 03/04/2017 0918   PROT 6.8 01/22/2015 1559   ALBUMIN 4.0 09/19/2018 0924   ALBUMIN 2.5  (L) 03/04/2017 0918   ALBUMIN 3.9 01/22/2015 1559   AST 37 09/19/2018 0924   AST 34 01/22/2015 1559   ALT 26 09/19/2018 0924   ALT 42 01/22/2015 1559   ALKPHOS 130 (H) 09/19/2018 0924   ALKPHOS 157 (H) 01/22/2015 1559   BILITOT 1.3 (H) 09/19/2018 0924   BILITOT 6.2 (H) 03/04/2017 0918   BILITOT 0.2 (L) 01/22/2015 1559   GFRNONAA >60 09/19/2018 0924   GFRNONAA >60 01/22/2015 1559   GFRAA >60 09/19/2018 0924   GFRAA >60 01/22/2015 1559    No results found for: SPEP, UPEP  Lab Results  Component Value Date   WBC 3.4 (L) 09/19/2018   NEUTROABS 2.0 09/19/2018   HGB 14.4 09/19/2018   HCT 44.7 09/19/2018   MCV 100.9 (H) 09/19/2018   PLT 141 (L) 09/19/2018      Chemistry      Component Value Date/Time   NA 142 09/19/2018 0924   NA 135 03/04/2017 0918   NA 135 01/08/2015 0850   K 3.4 (L) 09/19/2018 0924   K 3.7 01/08/2015 0850   CL 106 09/19/2018 0924   CL 101 01/08/2015 0850   CO2 26 09/19/2018 0924   CO2 26 01/08/2015 0850   BUN 14 09/19/2018 0924   BUN 8 03/04/2017 0918   BUN 17 01/08/2015 0850   CREATININE 0.80 09/19/2018 0924   CREATININE 0.82 01/22/2015 1559      Component Value Date/Time   CALCIUM 9.1 09/19/2018 0924   CALCIUM 9.3 01/08/2015 0850   ALKPHOS 130 (H) 09/19/2018 0924   ALKPHOS 157 (H) 01/22/2015 1559   AST 37 09/19/2018 0924   AST 34 01/22/2015 1559   ALT 26 09/19/2018 0924   ALT 42 01/22/2015 1559   BILITOT 1.3 (H) 09/19/2018 0924   BILITOT 6.2 (H) 03/04/2017 0918   BILITOT 0.2 (L) 01/22/2015 1559       RADIOGRAPHIC STUDIES: I have personally reviewed the radiological images as listed and  agreed with the findings in the report. No results found.   ASSESSMENT & PLAN:  Carcinoma of upper-outer quadrant of left breast in female, estrogen receptor positive ([place]) # Recurrent metastatic breast cancer-ER PR positive HER-2 negative; November 14 CT scan shows progressive liver lesions.  Currently on alpelisib 268m/day [starting 11/27]+  faslodex.  # continue current therapy. Labs today reviewed;  acceptable for treatment. FBG- 70; this AM. tolearting well- will plan standrad dose of 300 mg/day with next refill.   # hand foot syndrome-/hyperpigmentation of hand and feet- G-1 continue kenalog; urea 20%; vaseline bid. Improving.  # Brain metastases-recurrent/likely leptomeningeal disease;  S/p WBRT [April 2019]. NOv 19th MRI stable-no progressive leptomeningeal disease. STABLE.   #Peripheral neuropathy grade 1-  on Lyrica; stable.   # DISPOSITION: # shots today # follow up in 2 weeks- MD/labs- cbc/cmp/ca-27-29; cea Dr.B  Cc; [place].    No orders of the defined types were placed in this encounter.  All questions were answered. The patient knows to call the clinic with any problems, questions or concerns.      GCammie Sickle MD 09/19/2018 11:15 AM

## 2018-09-29 ENCOUNTER — Other Ambulatory Visit: Payer: Self-pay | Admitting: Internal Medicine

## 2018-09-29 DIAGNOSIS — Z170 Estrogen receptor positive status [ER+]: Principal | ICD-10-CM

## 2018-09-29 DIAGNOSIS — C50412 Malignant neoplasm of upper-outer quadrant of left female breast: Secondary | ICD-10-CM

## 2018-10-03 ENCOUNTER — Other Ambulatory Visit: Payer: Self-pay | Admitting: Pharmacist

## 2018-10-03 ENCOUNTER — Encounter: Payer: Self-pay | Admitting: Internal Medicine

## 2018-10-03 ENCOUNTER — Inpatient Hospital Stay: Payer: BLUE CROSS/BLUE SHIELD | Attending: Internal Medicine

## 2018-10-03 ENCOUNTER — Inpatient Hospital Stay (HOSPITAL_BASED_OUTPATIENT_CLINIC_OR_DEPARTMENT_OTHER): Payer: BLUE CROSS/BLUE SHIELD | Admitting: Internal Medicine

## 2018-10-03 DIAGNOSIS — L819 Disorder of pigmentation, unspecified: Secondary | ICD-10-CM

## 2018-10-03 DIAGNOSIS — Z170 Estrogen receptor positive status [ER+]: Secondary | ICD-10-CM | POA: Insufficient documentation

## 2018-10-03 DIAGNOSIS — R197 Diarrhea, unspecified: Secondary | ICD-10-CM | POA: Insufficient documentation

## 2018-10-03 DIAGNOSIS — Z7982 Long term (current) use of aspirin: Secondary | ICD-10-CM

## 2018-10-03 DIAGNOSIS — Z79899 Other long term (current) drug therapy: Secondary | ICD-10-CM | POA: Diagnosis not present

## 2018-10-03 DIAGNOSIS — C7931 Secondary malignant neoplasm of brain: Secondary | ICD-10-CM

## 2018-10-03 DIAGNOSIS — I252 Old myocardial infarction: Secondary | ICD-10-CM

## 2018-10-03 DIAGNOSIS — Z86711 Personal history of pulmonary embolism: Secondary | ICD-10-CM

## 2018-10-03 DIAGNOSIS — E785 Hyperlipidemia, unspecified: Secondary | ICD-10-CM | POA: Insufficient documentation

## 2018-10-03 DIAGNOSIS — Z803 Family history of malignant neoplasm of breast: Secondary | ICD-10-CM

## 2018-10-03 DIAGNOSIS — M8580 Other specified disorders of bone density and structure, unspecified site: Secondary | ICD-10-CM | POA: Insufficient documentation

## 2018-10-03 DIAGNOSIS — R748 Abnormal levels of other serum enzymes: Secondary | ICD-10-CM | POA: Insufficient documentation

## 2018-10-03 DIAGNOSIS — T451X5S Adverse effect of antineoplastic and immunosuppressive drugs, sequela: Secondary | ICD-10-CM

## 2018-10-03 DIAGNOSIS — Z7902 Long term (current) use of antithrombotics/antiplatelets: Secondary | ICD-10-CM

## 2018-10-03 DIAGNOSIS — Z79818 Long term (current) use of other agents affecting estrogen receptors and estrogen levels: Secondary | ICD-10-CM | POA: Diagnosis not present

## 2018-10-03 DIAGNOSIS — L271 Localized skin eruption due to drugs and medicaments taken internally: Secondary | ICD-10-CM | POA: Diagnosis not present

## 2018-10-03 DIAGNOSIS — R5381 Other malaise: Secondary | ICD-10-CM

## 2018-10-03 DIAGNOSIS — R5383 Other fatigue: Secondary | ICD-10-CM | POA: Insufficient documentation

## 2018-10-03 DIAGNOSIS — G620 Drug-induced polyneuropathy: Secondary | ICD-10-CM | POA: Diagnosis not present

## 2018-10-03 DIAGNOSIS — C50919 Malignant neoplasm of unspecified site of unspecified female breast: Secondary | ICD-10-CM

## 2018-10-03 DIAGNOSIS — I10 Essential (primary) hypertension: Secondary | ICD-10-CM

## 2018-10-03 DIAGNOSIS — C50412 Malignant neoplasm of upper-outer quadrant of left female breast: Secondary | ICD-10-CM | POA: Insufficient documentation

## 2018-10-03 DIAGNOSIS — F17210 Nicotine dependence, cigarettes, uncomplicated: Secondary | ICD-10-CM | POA: Insufficient documentation

## 2018-10-03 DIAGNOSIS — Z9221 Personal history of antineoplastic chemotherapy: Secondary | ICD-10-CM | POA: Insufficient documentation

## 2018-10-03 LAB — COMPREHENSIVE METABOLIC PANEL
ALT: 35 U/L (ref 0–44)
AST: 46 U/L — AB (ref 15–41)
Albumin: 4.4 g/dL (ref 3.5–5.0)
Alkaline Phosphatase: 141 U/L — ABNORMAL HIGH (ref 38–126)
Anion gap: 11 (ref 5–15)
BUN: 13 mg/dL (ref 8–23)
CO2: 26 mmol/L (ref 22–32)
Calcium: 9.3 mg/dL (ref 8.9–10.3)
Chloride: 105 mmol/L (ref 98–111)
Creatinine, Ser: 0.88 mg/dL (ref 0.44–1.00)
GFR calc Af Amer: >60 mL/min (ref >60)
GFR calc non Af Amer: >60 mL/min (ref >60)
Glucose, Bld: 84 mg/dL (ref 70–99)
Potassium: 4.1 mmol/L (ref 3.5–5.1)
Sodium: 142 mmol/L (ref 135–145)
Total Bilirubin: 1.2 mg/dL (ref 0.3–1.2)
Total Protein: 8.1 g/dL (ref 6.5–8.1)

## 2018-10-03 LAB — CBC WITH DIFFERENTIAL/PLATELET
Abs Immature Granulocytes: 0 10*3/uL (ref 0.00–0.07)
BASOS ABS: 0 10*3/uL (ref 0.0–0.1)
Basophils Relative: 1 %
Eosinophils Absolute: 0.3 10*3/uL (ref 0.0–0.5)
Eosinophils Relative: 9 %
HCT: 47.7 % — ABNORMAL HIGH (ref 36.0–46.0)
Hemoglobin: 15.8 g/dL — ABNORMAL HIGH (ref 12.0–15.0)
Immature Granulocytes: 0 %
LYMPHS ABS: 0.9 10*3/uL (ref 0.7–4.0)
Lymphocytes Relative: 27 %
MCH: 31.9 pg (ref 26.0–34.0)
MCHC: 33.1 g/dL (ref 30.0–36.0)
MCV: 96.4 fL (ref 80.0–100.0)
Monocytes Absolute: 0.2 10*3/uL (ref 0.1–1.0)
Monocytes Relative: 6 %
NRBC: 0 % (ref 0.0–0.2)
Neutro Abs: 1.9 10*3/uL (ref 1.7–7.7)
Neutrophils Relative %: 57 %
Platelets: 193 10*3/uL (ref 150–400)
RBC: 4.95 MIL/uL (ref 3.87–5.11)
RDW: 15.3 % (ref 11.5–15.5)
WBC: 3.3 10*3/uL — ABNORMAL LOW (ref 4.0–10.5)

## 2018-10-03 MED ORDER — ALPELISIB (300 MG DAILY DOSE) 2 X 150 MG PO TBPK: ORAL_TABLET | ORAL | 4 refills | Status: DC

## 2018-10-03 NOTE — Patient Instructions (Signed)
#  Take Imodium 1 pill [over-the-counter] after each bowel movement up to 4-day; if not improving call us.

## 2018-10-03 NOTE — Progress Notes (Signed)
[facility] OFFICE PROGRESS NOTE  Patient Care Team: Steele Sizer, MD as PCP - General (Family Medicine) Wellington Hampshire, MD as PCP - Cardiology (Cardiology) Bary Castilla, Forest Gleason, MD (General Surgery) [name], MD (Inactive) as Attending Physician (Internal Medicine)  Cancer Staging No matching staging information was found for the patient.   Oncology History   # LEFT BREAST IDC; STAGE II [cT2N1] ER >90%; PR- 50-90%; her 2 NEU- POS; s/p Neoadj chemo; AUG 2016- TCH+P s/p Lumpec & partial ALND- path CR; s/p RT [finished Nov 2016];  adj Herceptin; HELD for Jan 19th 2017 [in DEC 2016-EF dropped from 63 to 51%; FEB 27th EF-42.9%]; JAN 2016 START Arimidex; May 2017- EF- 60%; May 24th 2017-Re-start Herceptin q 3W; July 28th STOP herceptin [finished 70m/m2; sec to Low EF; however 2017 Oct EF= 67%; improved]  # DEC 4th 2017- Start Neratinib 4 pills; DEC 11th 3 pills; STOPPED.  # FEB 2018- RECURRENCE ER/PR positive; Her 2 NEGATIVE [liver Bx]  # MARCH 1st 2018- Tax-Cytoxan [poor tol]  # FEB 2018- Brain mets [SBRT; Dr.Crystal; finished Feb 28th 2018]  # March 2018- Eribulin- poor tolerance  # June 8th 2018- Started Faslodex + Abema [abema-multiple interruptions sec to neutropenia]  # MRI Brain- March 2019- Leptomeningeal mets [WBRT; No LP s/p WBRT- 01/09/2018]; STOP Abema [539mday-sec to severe cytopenia]+faslodex  # MAY 20tth 019- Start Xeloda; STOPPED in Nov 22nd 2019- sec to Progressive liver lesions on CT scan  # NOV 26th 2019- Faslodex + Piqaray-   # PN- G-2; may 2017- Cymbalta 6040m;MARCH 2018-  acute vascular insuff of BIL LE [? Taxotere vasoconstriction on asprin]; # hemorragic shock LGIB [s/p colo; Dr.Wohl]  #  SVT- on flecainide.   # April 2016- Liver Bx- NEG;   # Drop in EF from Herceptin [recovered OCt 27th- EF 65%]  # BMD- jan 2017- osteopenia  -----------------------------------------------------------     MOLDimmitte-  Sep 4th 2018- PDL-1 0%/NEG for BRCA; MSI-Stable; Positive for PI3K; ccnd-1; FGF** -----------------------------------------------------------  Dx: Metastatic Breast cancer [ER/PR-Pos; her -2 NEG] Stage IV; goals: palliative Current treatment- Faslodex + Piqaray [11/27]     Carcinoma of upper-outer quadrant of left breast in female, estrogen receptor positive [place]  INTERVAL HISTORY:  Sarah Horne 71o.  female pleasant patient above history of metastatic breast cancer ER PR positive/leptomeningeal disease metastatic to [place] started on Faslodex plus Piqray is here for follow-up.  Patient complains of loose stool up to 1-2 a day.  Sarah Horne is not taking Imodium.  No nausea no vomiting.  Appetite is good.  No weight loss.  No fevers or chills.   Review of Systems  Constitutional: Positive for malaise/fatigue. Negative for chills, diaphoresis, fever and weight loss.  HENT: Negative for nosebleeds and sore throat.   Eyes: Negative for double vision.  Respiratory: Negative for cough, hemoptysis, sputum production, shortness of breath and wheezing.   Cardiovascular: Negative for chest pain, palpitations, orthopnea and leg swelling.  Gastrointestinal: Positive for diarrhea. Negative for abdominal pain, blood in stool, constipation, heartburn, melena, nausea and vomiting.  Genitourinary: Negative for dysuria, frequency and urgency.  Musculoskeletal: Positive for joint pain. Negative for back pain.  Skin: Positive for rash. Negative for itching.  Neurological: Positive for tingling. Negative for dizziness, focal weakness, weakness and headaches.  Endo/Heme/Allergies: Does not bruise/bleed easily.  Psychiatric/Behavioral: Negative for depression. The patient is not nervous/anxious and does not have insomnia.       PAST MEDICAL HISTORY :  Past Medical History:  Diagnosis Date  . Chemotherapy-induced peripheral neuropathy ([place])   . Epistaxis    a. 11/2016 in setting of  asa/plavix-->silver nitrate cauterization.  . GI bleed    a. 11/2016 Admission w/ GIB and hypovolemic shock req 3u PRBC's;  b. 11/2016 ECG: gastritis & nonbleeding peptic ulcer; c. 11/2016 Conlonoscopy: rectal and sigmoid colonic ulcers.  . Heart attack ([place])    a. 1998 Cath @ [facility]: reportedly no intervention required.  Marland Kitchen Herceptin-induced cardiomyopathy ([place])    a. In the setting of Herceptin Rx for breast cancer (initiated 12/2014); b. 03/2015 MUGA EF 64%; b. 08/2015 MUGA: EF 51%; c. 10/2015 MUGA: EF 44%; d. 11/2015 Echo: EF 45-50%; e. 01/2016 MUGA: EF 60%; f. 06/2016 MUGA EF 65%; g. 10/2016 MUGA: EF 61%;  h. 12/2016 Echo: EF 55-60%, gr1 DD.  Marland Kitchen Hyperlipidemia   . Hypertension   . Neuropathy   . Possible PAD (peripheral artery disease) ([place])    a. 11/2016 LE cyanosis and weak pulses-->CTA w/o significant Ao-BiFem dzs. ? distal dzs-->ASA/Plavix initiated by vascular surgery.  Marland Kitchen PSVT (paroxysmal supraventricular tachycardia) ([place])    a. Dx 11/2016.  . Pulmonary embolism ([place])    a. 12/2016 CTA Chest: small nonocclusive PE in inferior segment of the Left lingula, somewhat eccentric filling defect suggesting chronic rather than acute embolic event; b. 05/1504 LE U/S:  No DVT; c. 12/2016 Echo: Nl RV fxn, nl PASP.  Marland Kitchen Recurrent Metastatic breast cancer ([place])    a. Dx 2016: Stage II, ER positive, PR positive, HER-2/neu overexpressing of the left breast-->chemo/radiation; b. 10/2016 CT Abd/pelvis: diffuse liver mets, ill defined sclerotic bone lesions-T12;  c. 10/2016 MRI brain: metastatic lesion along L temporal lobe (19x84m) w/ extensive surrounding edema & 582mmidline shift to right.  . Sinus tachycardia     PAST SURGICAL HISTORY :   Past Surgical History:  Procedure Laterality Date  . BREAST BIOPSY Left 2016   Positive  . BREAST LUMPECTOMY WITH SENTINEL LYMPH NODE BIOPSY Left 05/23/2015   Procedure: LEFT BREAST WIDE EXCISION WITH AXILLARY DISSECTION, MASTOPLASTY ;  Surgeon: JeRobert BellowMD;  Location:  [facility] ORS;  Service: General;  Laterality: Left;  . BREAST SURGERY Left 12/18/14   breast biopsy/INVASIVE DUCTAL CARCINOMA OF BREAST, NOTTINGHAM GRADE 2.  . Marland KitchenREAST SURGERY  05/23/2015.   Wide excision/mastoplasty, axillary dissection. No residual invasive cancer, positive for residual DCIS. 0/2 nodes identified on axillary dissection. (no SLN by technetium or methylene blue)  . CARDIAC CATHETERIZATION    . COLONOSCOPY WITH PROPOFOL N/A 12/10/2016   Procedure: COLONOSCOPY WITH PROPOFOL;  Surgeon: DaLucilla LameMD;  Location: [facility] ENDOSCOPY;  Service: Endoscopy;  Laterality: N/A;  . COLONOSCOPY WITH PROPOFOL N/A 02/13/2017   Procedure: COLONOSCOPY WITH PROPOFOL;  Surgeon: WoLucilla LameMD;  [facility];  Service: Endoscopy;  Laterality: N/A;  . ESOPHAGOGASTRODUODENOSCOPY (EGD) WITH PROPOFOL N/A 12/08/2016   Procedure: ESOPHAGOGASTRODUODENOSCOPY (EGD) WITH PROPOFOL;  Surgeon: DaLucilla LameMD;  Location: [facility] ENDOSCOPY;  Service: Endoscopy;  Laterality: N/A;  . IVC FILTER INSERTION N/A 02/15/2017   Procedure: IVC Filter Insertion;  Surgeon: DeAlgernon HuxleyMD;  [facility];  Service: Cardiovascular;  Laterality: N/A;  . PORTACATH PLACEMENT Right 12-31-14   Dr ByBary Castilla  FAMILY HISTORY :   Family History  Problem Relation Age of Onset  . Breast cancer Maternal Aunt   . Breast cancer Cousin   . Brain cancer Maternal Uncle   . Diabetes Mother   .  Hypertension Mother   . Stroke Mother     SOCIAL HISTORY:   Social History   Tobacco Use  . Smoking status: Light Tobacco Smoker    Packs/day: 0.50    Years: 18.00    Pack years: 9.00    Types: Cigarettes  . Smokeless tobacco: Never Used  Substance Use Topics  . Alcohol use: No    Alcohol/week: 0.0 standard drinks  . Drug use: No    ALLERGIES:  is allergic to no known allergies.  MEDICATIONS:  Current Outpatient Medications  Medication Sig Dispense Refill  . acetic acid-hydrocortisone (VOSOL-HC) OTIC solution  Place 2 drops into both ears daily as needed. 10 mL 0  . flecainide (TAMBOCOR) 50 MG tablet Take 1 tablet (50 mg total) by mouth 2 (two) times daily. 180 tablet 3  . KLOR-CON M20 20 MEQ tablet TAKE ONE TABLE BY MOUTH TWICE A DAY 180 tablet 1  . levocetirizine (XYZAL) 5 MG tablet Take 1 tablet (5 mg total) by mouth every evening. 30 tablet 3  . magnesium oxide (MAG-OX) 400 MG tablet TAKE 1 TABLET (400 MG TOTAL) BY MOUTH 2 (TWO) TIMES DAILY. 60 tablet 6  . potassium chloride (KLOR-CON) 20 MEQ packet Take by mouth 2 (two) times daily.    . pregabalin (LYRICA) 75 MG capsule Take 1 capsule (75 mg total) by mouth 2 (two) times daily. 180 capsule 0  . triamcinolone ointment (KENALOG) 0.5 % Apply 1 application topically 2 (two) times daily. 30 g 2  . Vitamin D, Ergocalciferol, (DRISDOL) 50000 units CAPS capsule TAKE 1 CAPSULE (50,000 UNITS TOTAL) ONCE A WEEK BY MOUTH. 12 capsule 1  . alpelisib (PIQRAY 300MG DAILY DOSE) 2 x 150 MG Therapy Pack Take two 13m tablets with food at the same time daily. Swallow whole, do not crush, chew, or split. 60 each 4   No current facility-administered medications for this visit.    Facility-Administered Medications Ordered in Other Visits  Medication Dose Route Frequency Provider Last Rate Last Dose  . 0.9 %  sodium chloride infusion   Intravenous Continuous Pandit, Sandeep, MD      . 0.9 %  sodium chloride infusion   Intravenous Continuous FLloyd Huger MD 999 mL/hr at 04/24/15 1520    . heparin lock flush 100 unit/mL  500 Units Intravenous Once Berenzon, Dmitriy, MD      . sodium chloride 0.9 % injection 10 mL  10 mL Intravenous PRN PLeia Alf MD   10 mL at 04/04/15 1440  . sodium chloride flush (NS) 0.9 % injection 10 mL  10 mL Intravenous PRN Berenzon, Dmitriy, MD      . Tbo-Filgrastim (GRANIX) injection 480 mcg  480 mcg Subcutaneous Once BCammie Sickle MD        PHYSICAL EXAMINATION: ECOG PERFORMANCE STATUS: 1 - Symptomatic but completely  ambulatory  There were no vitals taken for this visit.  There were no vitals filed for this visit.  Physical Exam  Constitutional: Sarah Horne is oriented to person, place, and time and well-developed, well-nourished, and in no distress.  Alone.  Walking herself.  HENT:  Head: Normocephalic and atraumatic.  Mouth/Throat: Oropharynx is clear and moist. No oropharyngeal exudate.  Eyes: Pupils are equal, round, and reactive to light.  Neck: Normal range of motion. Neck supple.  Cardiovascular: Normal rate and regular rhythm.  Pulmonary/Chest: No respiratory distress. Sarah Horne has no wheezes.  Abdominal: Soft. Bowel sounds are normal. Sarah Horne exhibits no distension and no mass. There is no abdominal  tenderness. There is no rebound and no guarding.  Musculoskeletal: Normal range of motion.        General: No tenderness or edema.  Neurological: Sarah Horne is alert and oriented to person, place, and time.  Skin: Skin is warm.  Significant hyperpigmentation of the bilateral hand and feet.  No desquamation.  Psychiatric: Affect normal.       LABORATORY DATA:  I have reviewed the data as listed    Component Value Date/Time   NA 142 10/03/2018 0914   NA 135 03/04/2017 0918   NA 135 01/08/2015 0850   K 4.1 10/03/2018 0914   K 3.7 01/08/2015 0850   CL 105 10/03/2018 0914   CL 101 01/08/2015 0850   CO2 26 10/03/2018 0914   CO2 26 01/08/2015 0850   GLUCOSE 84 10/03/2018 0914   GLUCOSE 161 (H) 01/08/2015 0850   BUN 13 10/03/2018 0914   BUN 8 03/04/2017 0918   BUN 17 01/08/2015 0850   CREATININE 0.88 10/03/2018 0914   CREATININE 0.82 01/22/2015 1559   CALCIUM 9.3 10/03/2018 0914   CALCIUM 9.3 01/08/2015 0850   PROT 8.1 10/03/2018 0914   PROT 6.4 03/04/2017 0918   PROT 6.8 01/22/2015 1559   ALBUMIN 4.4 10/03/2018 0914   ALBUMIN 2.5 (L) 03/04/2017 0918   ALBUMIN 3.9 01/22/2015 1559   AST 46 (H) 10/03/2018 0914   AST 34 01/22/2015 1559   ALT 35 10/03/2018 0914   ALT 42 01/22/2015 1559   ALKPHOS 141  (H) 10/03/2018 0914   ALKPHOS 157 (H) 01/22/2015 1559   BILITOT 1.2 10/03/2018 0914   BILITOT 6.2 (H) 03/04/2017 0918   BILITOT 0.2 (L) 01/22/2015 1559   GFRNONAA >60 10/03/2018 0914   GFRNONAA >60 01/22/2015 1559   GFRAA >60 10/03/2018 0914   GFRAA >60 01/22/2015 1559    No results found for: SPEP, UPEP  Lab Results  Component Value Date   WBC 3.3 (L) 10/03/2018   NEUTROABS 1.9 10/03/2018   HGB 15.8 (H) 10/03/2018   HCT 47.7 (H) 10/03/2018   MCV 96.4 10/03/2018   PLT 193 10/03/2018      Chemistry      Component Value Date/Time   NA 142 10/03/2018 0914   NA 135 03/04/2017 0918   NA 135 01/08/2015 0850   K 4.1 10/03/2018 0914   K 3.7 01/08/2015 0850   CL 105 10/03/2018 0914   CL 101 01/08/2015 0850   CO2 26 10/03/2018 0914   CO2 26 01/08/2015 0850   BUN 13 10/03/2018 0914   BUN 8 03/04/2017 0918   BUN 17 01/08/2015 0850   CREATININE 0.88 10/03/2018 0914   CREATININE 0.82 01/22/2015 1559      Component Value Date/Time   CALCIUM 9.3 10/03/2018 0914   CALCIUM 9.3 01/08/2015 0850   ALKPHOS 141 (H) 10/03/2018 0914   ALKPHOS 157 (H) 01/22/2015 1559   AST 46 (H) 10/03/2018 0914   AST 34 01/22/2015 1559   ALT 35 10/03/2018 0914   ALT 42 01/22/2015 1559   BILITOT 1.2 10/03/2018 0914   BILITOT 6.2 (H) 03/04/2017 0918   BILITOT 0.2 (L) 01/22/2015 1559       RADIOGRAPHIC STUDIES: I have personally reviewed the radiological images as listed and agreed with the findings in the report. No results found.   ASSESSMENT & PLAN:  Carcinoma of upper-outer quadrant of left breast in female, estrogen receptor positive ([place]) # Recurrent metastatic breast cancer-ER PR positive HER-2 negative; November 14 CT scan shows progressive liver  lesions.  Currently on alpelisib 235m/day [starting 11/27]+ faslodex.  # continue current therapy. Labs today reviewed;  acceptable for treatment. FBG- 85 this AM. tolearting well- will increase to standrad dose of 300 mg/day; printed new  script.  Discussed with AEbony Hail  Will repeat CT scan in mid- feb 2020.   # Diarrhea- G-1; recommend Imodium prn.   # hand foot syndrome-/hyperpigmentation of hand and feet- G-1 continue kenalog; urea 20%; vaseline bid.  Improving.  # Brain metastases-recurrent/likely leptomeningeal disease;  S/p WBRT [April 2019]. NOv 19th MRI stable-no progressive leptomeningeal disease. Stable.   #Peripheral neuropathy grade 1-  on Lyrica; stable.   # DISPOSITION: # follow up in 2 weeks; MD/labs- cbc/cmp; Faslodex- Dr.B  Cc; Allysson- increase to 300 mg/day.    No orders of the defined types were placed in this encounter.  All questions were answered. The patient knows to call the clinic with any problems, questions or concerns.      GCammie Sickle MD 10/03/2018 10:21 AM

## 2018-10-03 NOTE — Assessment & Plan Note (Addendum)
#  Recurrent metastatic breast cancer-ER PR positive HER-2 negative; November 14 CT scan shows progressive liver lesions.  Currently on alpelisib '250mg'$ /day [starting 11/27]+ faslodex.  # continue current therapy. Labs today reviewed;  acceptable for treatment. FBG- 85 this AM. tolearting well- will increase to standrad dose of 300 mg/day; printed new script.  Discussed with Ebony Hail.  Will repeat CT scan in mid- feb 2020.   # Diarrhea- G-1; recommend Imodium prn.   #Elevated alkaline phosphatase 140 slowly rising-monitor for now.  Malignancy versus piqray.   # hand foot syndrome-/hyperpigmentation of hand and feet- G-1 continue kenalog; urea 20%; vaseline bid.  Improving.  # Brain metastases-recurrent/likely leptomeningeal disease;  S/p WBRT [April 2019]. NOv 19th MRI stable-no progressive leptomeningeal disease. Stable.   #Peripheral neuropathy grade 1-  on Lyrica; stable.   # DISPOSITION: # follow up in 2 weeks; MD/labs- cbc/cmp; Faslodex- Dr.B  Cc; Allysson- increase to 300 mg/day.

## 2018-10-04 LAB — CANCER ANTIGEN 27.29: CA 27.29: 20.7 U/mL (ref 0.0–38.6)

## 2018-10-07 MED FILL — PIQRAY 300MG DAILY DOSE 2X1: 2 X 150 | 28 days supply | Qty: 56 | Fill #0

## 2018-10-14 ENCOUNTER — Other Ambulatory Visit: Payer: Self-pay | Admitting: *Deleted

## 2018-10-14 DIAGNOSIS — C50412 Malignant neoplasm of upper-outer quadrant of left female breast: Secondary | ICD-10-CM

## 2018-10-14 DIAGNOSIS — Z170 Estrogen receptor positive status [ER+]: Principal | ICD-10-CM

## 2018-10-17 ENCOUNTER — Inpatient Hospital Stay: Payer: BLUE CROSS/BLUE SHIELD

## 2018-10-17 ENCOUNTER — Encounter: Payer: Self-pay | Admitting: Internal Medicine

## 2018-10-17 ENCOUNTER — Inpatient Hospital Stay (HOSPITAL_BASED_OUTPATIENT_CLINIC_OR_DEPARTMENT_OTHER): Payer: BLUE CROSS/BLUE SHIELD | Admitting: Internal Medicine

## 2018-10-17 VITALS — BP 144/94 | HR 82 | Temp 97.6°F | Resp 16 | Wt 156.6 lb

## 2018-10-17 DIAGNOSIS — L819 Disorder of pigmentation, unspecified: Secondary | ICD-10-CM

## 2018-10-17 DIAGNOSIS — G620 Drug-induced polyneuropathy: Secondary | ICD-10-CM

## 2018-10-17 DIAGNOSIS — C50412 Malignant neoplasm of upper-outer quadrant of left female breast: Secondary | ICD-10-CM

## 2018-10-17 DIAGNOSIS — Z170 Estrogen receptor positive status [ER+]: Principal | ICD-10-CM

## 2018-10-17 DIAGNOSIS — I10 Essential (primary) hypertension: Secondary | ICD-10-CM

## 2018-10-17 DIAGNOSIS — F17210 Nicotine dependence, cigarettes, uncomplicated: Secondary | ICD-10-CM

## 2018-10-17 DIAGNOSIS — E785 Hyperlipidemia, unspecified: Secondary | ICD-10-CM

## 2018-10-17 DIAGNOSIS — I252 Old myocardial infarction: Secondary | ICD-10-CM

## 2018-10-17 DIAGNOSIS — R5381 Other malaise: Secondary | ICD-10-CM | POA: Diagnosis not present

## 2018-10-17 DIAGNOSIS — Z79818 Long term (current) use of other agents affecting estrogen receptors and estrogen levels: Secondary | ICD-10-CM

## 2018-10-17 DIAGNOSIS — R5383 Other fatigue: Secondary | ICD-10-CM | POA: Diagnosis not present

## 2018-10-17 DIAGNOSIS — Z9221 Personal history of antineoplastic chemotherapy: Secondary | ICD-10-CM

## 2018-10-17 DIAGNOSIS — R748 Abnormal levels of other serum enzymes: Secondary | ICD-10-CM | POA: Diagnosis not present

## 2018-10-17 DIAGNOSIS — L271 Localized skin eruption due to drugs and medicaments taken internally: Secondary | ICD-10-CM

## 2018-10-17 DIAGNOSIS — Z7982 Long term (current) use of aspirin: Secondary | ICD-10-CM

## 2018-10-17 DIAGNOSIS — R197 Diarrhea, unspecified: Secondary | ICD-10-CM | POA: Diagnosis not present

## 2018-10-17 DIAGNOSIS — T451X5S Adverse effect of antineoplastic and immunosuppressive drugs, sequela: Secondary | ICD-10-CM | POA: Diagnosis not present

## 2018-10-17 DIAGNOSIS — Z86711 Personal history of pulmonary embolism: Secondary | ICD-10-CM

## 2018-10-17 DIAGNOSIS — Z79899 Other long term (current) drug therapy: Secondary | ICD-10-CM

## 2018-10-17 DIAGNOSIS — M8580 Other specified disorders of bone density and structure, unspecified site: Secondary | ICD-10-CM | POA: Diagnosis not present

## 2018-10-17 DIAGNOSIS — E860 Dehydration: Secondary | ICD-10-CM

## 2018-10-17 DIAGNOSIS — Z803 Family history of malignant neoplasm of breast: Secondary | ICD-10-CM

## 2018-10-17 DIAGNOSIS — C7931 Secondary malignant neoplasm of brain: Secondary | ICD-10-CM

## 2018-10-17 DIAGNOSIS — Z7902 Long term (current) use of antithrombotics/antiplatelets: Secondary | ICD-10-CM

## 2018-10-17 LAB — CBC WITH DIFFERENTIAL/PLATELET
Abs Immature Granulocytes: 0 10*3/uL (ref 0.00–0.07)
Basophils Absolute: 0 10*3/uL (ref 0.0–0.1)
Basophils Relative: 1 %
Eosinophils Absolute: 0.3 10*3/uL (ref 0.0–0.5)
Eosinophils Relative: 7 %
HCT: 46.4 % — ABNORMAL HIGH (ref 36.0–46.0)
Hemoglobin: 15.1 g/dL — ABNORMAL HIGH (ref 12.0–15.0)
Immature Granulocytes: 0 %
Lymphocytes Relative: 26 %
Lymphs Abs: 1 10*3/uL (ref 0.7–4.0)
MCH: 31.3 pg (ref 26.0–34.0)
MCHC: 32.5 g/dL (ref 30.0–36.0)
MCV: 96.1 fL (ref 80.0–100.0)
Monocytes Absolute: 0.3 10*3/uL (ref 0.1–1.0)
Monocytes Relative: 7 %
NRBC: 0 % (ref 0.0–0.2)
Neutro Abs: 2.3 10*3/uL (ref 1.7–7.7)
Neutrophils Relative %: 59 %
Platelets: 162 10*3/uL (ref 150–400)
RBC: 4.83 MIL/uL (ref 3.87–5.11)
RDW: 14.8 % (ref 11.5–15.5)
WBC: 3.8 10*3/uL — ABNORMAL LOW (ref 4.0–10.5)

## 2018-10-17 LAB — COMPREHENSIVE METABOLIC PANEL WITH GFR
ALT: 40 U/L (ref 0–44)
AST: 45 U/L — ABNORMAL HIGH (ref 15–41)
Albumin: 4.3 g/dL (ref 3.5–5.0)
Alkaline Phosphatase: 146 U/L — ABNORMAL HIGH (ref 38–126)
Anion gap: 11 (ref 5–15)
BUN: 14 mg/dL (ref 8–23)
CO2: 27 mmol/L (ref 22–32)
Calcium: 9.2 mg/dL (ref 8.9–10.3)
Chloride: 101 mmol/L (ref 98–111)
Creatinine, Ser: 0.95 mg/dL (ref 0.44–1.00)
GFR calc Af Amer: >60 mL/min
GFR calc non Af Amer: >60 mL/min
Glucose, Bld: 84 mg/dL (ref 70–99)
Potassium: 3.9 mmol/L (ref 3.5–5.1)
Sodium: 139 mmol/L (ref 135–145)
Total Bilirubin: 1.3 mg/dL — ABNORMAL HIGH (ref 0.3–1.2)
Total Protein: 8.3 g/dL — ABNORMAL HIGH (ref 6.5–8.1)

## 2018-10-17 MED ORDER — FULVESTRANT 250 MG/5ML IM SOLN
500.0000 mg | Freq: Once | INTRAMUSCULAR | Status: AC
  Administered 2018-10-17: 500 mg via INTRAMUSCULAR
  Filled 2018-10-17: qty 10

## 2018-10-17 NOTE — Assessment & Plan Note (Addendum)
#  Recurrent metastatic breast cancer-ER PR positive HER-2 negative; November 14 CT scan shows progressive liver lesions.  Currently on alpelisib  [starting 11/27]+ faslodex.  # continue current therapy. Labs today reviewed;  acceptable for treatment. FBG- 84 this AM. Will plan scan again in mid-end of feb 2020; will order at next visit.    # Diarrhea- G-1; recommend Imodium prn.   #Elevated alkaline phosphatase 140 slowly rising-monitor for now.  Malignancy versus piqray. Stable.   # hand foot syndrome-/hyperpigmentation of hand and feet- G-1 fro xeloda- improving.   # Brain metastases-recurrent/likely leptomeningeal disease;  S/p WBRT [April 2019]. NOv 19th MRI stable-no progressive leptomeningeal disease. Stable.   #Peripheral neuropathy grade 1-  on Lyrica; stable.   # DISPOSITION: # shot today # follow up in 2 weeks; MD/labs- cbc/cmp; ca-27-29- Dr.B

## 2018-10-17 NOTE — Addendum Note (Signed)
Addended by: Sandria Bales B on: 10/17/2018 11:01 AM   Modules accepted: Orders

## 2018-10-17 NOTE — Progress Notes (Signed)
[facility] OFFICE PROGRESS NOTE  Patient Care Team: Steele Sizer, MD as PCP - General (Family Medicine) Wellington Hampshire, MD as PCP - Cardiology (Cardiology) Bary Castilla, Forest Gleason, MD (General Surgery) [name], MD (Inactive) as Attending Physician (Internal Medicine)  Cancer Staging No matching staging information was found for the patient.   Oncology History   # LEFT BREAST IDC; STAGE II [cT2N1] ER >90%; PR- 50-90%; her 2 NEU- POS; s/p Neoadj chemo; AUG 2016- TCH+P s/p Lumpec & partial ALND- path CR; s/p RT [finished Nov 2016];  adj Herceptin; HELD for Jan 19th 2017 [in DEC 2016-EF dropped from 63 to 51%; FEB 27th EF-42.9%]; JAN 2016 START Arimidex; May 2017- EF- 60%; May 24th 2017-Re-start Herceptin q 3W; July 28th STOP herceptin [finished '86mg'$ /m2; sec to Low EF; however 2017 Oct EF= 67%; improved]  # DEC 4th 2017- Start Neratinib 4 pills; DEC 11th 3 pills; STOPPED.  # FEB 2018- RECURRENCE ER/PR positive; Her 2 NEGATIVE [liver Bx]  # MARCH 1st 2018- Tax-Cytoxan [poor tol]  # FEB 2018- Brain mets [SBRT; Dr.Crystal; finished Feb 28th 2018]  # March 2018- Eribulin- poor tolerance  # June 8th 2018- Started Faslodex + Abema [abema-multiple interruptions sec to neutropenia]  # MRI Brain- March 2019- Leptomeningeal mets [WBRT; No LP s/p WBRT- 01/09/2018]; STOP Abema ['50mg'$ /day-sec to severe cytopenia]+faslodex  # MAY 20tth 019- Start Xeloda; STOPPED in Nov 22nd 2019- sec to Progressive liver lesions on CT scan  # NOV 26th 2019- Faslodex + Piqaray '250mg'$ /d; Jan 2020- Piqray 300 mg+ faslodex   # PN- G-2; may 2017- Cymbalta '60mg'$ /d;MARCH 2018-  acute vascular insuff of BIL LE [? Taxotere vasoconstriction on asprin]; # hemorragic shock LGIB [s/p colo; Dr.Wohl]  #  SVT- on flecainide.   # April 2016- Liver Bx- NEG;   # Drop in EF from Herceptin [recovered OCt 27th- EF 65%]  # BMD- jan 2017- osteopenia  -----------------------------------------------------------      [place] [facility]- Sep 4th 2018- PDL-1 0%/NEG for BRCA; MSI-Stable; Positive for PI3K; ccnd-1; FGF** -----------------------------------------------------------  Dx: Metastatic Breast cancer [ER/PR-Pos; her -2 NEG] Stage IV; goals: palliative Current treatment- Faslodex + Piqaray [11/27]     Carcinoma of upper-outer quadrant of left breast in female, estrogen receptor positive ([place])    INTERVAL HISTORY:  DAILYNN Sarah Horne 63 y.o.  female pleasant patient above history of metastatic breast cancer ER PR positive/leptomeningeal disease metastatic to [place] recently started on Faslodex plus Piqray is here for follow-up.  Patient denies any nausea vomiting.  Appetite is fair.  No weight loss.  Denies any headaches.  1-2 loose stools a day.  She is not having to take Imodium.  No fevers or chills.  Chronic rash in the hand and feet but is improving.  Chronic mild tingling and numbness.  Review of Systems  Constitutional: Positive for malaise/fatigue. Negative for chills, diaphoresis, fever and weight loss.  HENT: Negative for nosebleeds and sore throat.   Eyes: Negative for double vision.  Respiratory: Negative for cough, hemoptysis, sputum production, shortness of breath and wheezing.   Cardiovascular: Negative for chest pain, palpitations, orthopnea and leg swelling.  Gastrointestinal: Positive for diarrhea. Negative for abdominal pain, blood in stool, constipation, heartburn, melena, nausea and vomiting.  Genitourinary: Negative for dysuria, frequency and urgency.  Musculoskeletal: Positive for joint pain. Negative for back pain.  Skin: Positive for rash. Negative for itching.  Neurological: Positive for tingling. Negative for dizziness, focal weakness, weakness and headaches.  Endo/Heme/Allergies: Does not bruise/bleed  easily.  Psychiatric/Behavioral: Negative for depression. The patient is not nervous/anxious and does not have insomnia.       PAST MEDICAL  HISTORY :  Past Medical History:  Diagnosis Date  . Chemotherapy-induced peripheral neuropathy ([place])   . Epistaxis    a. 11/2016 in setting of asa/plavix-->silver nitrate cauterization.  . GI bleed    a. 11/2016 Admission w/ GIB and hypovolemic shock req 3u PRBC's;  b. 11/2016 ECG: gastritis & nonbleeding peptic ulcer; c. 11/2016 Conlonoscopy: rectal and sigmoid colonic ulcers.  . Heart attack ([place])    a. 1998 Cath @ [facility]: reportedly no intervention required.  Marland Kitchen Herceptin-induced cardiomyopathy ([place])    a. In the setting of Herceptin Rx for breast cancer (initiated 12/2014); b. 03/2015 MUGA EF 64%; b. 08/2015 MUGA: EF 51%; c. 10/2015 MUGA: EF 44%; d. 11/2015 Echo: EF 45-50%; e. 01/2016 MUGA: EF 60%; f. 06/2016 MUGA EF 65%; g. 10/2016 MUGA: EF 61%;  h. 12/2016 Echo: EF 55-60%, gr1 DD.  Marland Kitchen Hyperlipidemia   . Hypertension   . Neuropathy   . Possible PAD (peripheral artery disease) ([place])    a. 11/2016 LE cyanosis and weak pulses-->CTA w/o significant Ao-BiFem dzs. ? distal dzs-->ASA/Plavix initiated by vascular surgery.  Marland Kitchen PSVT (paroxysmal supraventricular tachycardia) ([place])    a. Dx 11/2016.  . Pulmonary embolism ([place])    a. 12/2016 CTA Chest: small nonocclusive PE in inferior segment of the Left lingula, somewhat eccentric filling defect suggesting chronic rather than acute embolic event; b. 02/4157 LE U/S:  No DVT; c. 12/2016 Echo: Nl RV fxn, nl PASP.  Marland Kitchen Recurrent Metastatic breast cancer ([place])    a. Dx 2016: Stage II, ER positive, PR positive, HER-2/neu overexpressing of the left breast-->chemo/radiation; b. 10/2016 CT Abd/pelvis: diffuse liver mets, ill defined sclerotic bone lesions-T12;  c. 10/2016 MRI brain: metastatic lesion along L temporal lobe (19x42m) w/ extensive surrounding edema & 540mmidline shift to right.  . Sinus tachycardia     PAST SURGICAL HISTORY :   Past Surgical History:  Procedure Laterality Date  . BREAST BIOPSY Left 2016   Positive  . BREAST LUMPECTOMY WITH SENTINEL LYMPH NODE  BIOPSY Left 05/23/2015   Procedure: LEFT BREAST WIDE EXCISION WITH AXILLARY DISSECTION, MASTOPLASTY ;  Surgeon: JeRobert BellowMD;  Location: [facility] ORS;  Service: General;  Laterality: Left;  . BREAST SURGERY Left 12/18/14   breast biopsy/INVASIVE DUCTAL CARCINOMA OF BREAST, NOTTINGHAM GRADE 2.  . Marland KitchenREAST SURGERY  05/23/2015.   Wide excision/mastoplasty, axillary dissection. No residual invasive cancer, positive for residual DCIS. 0/2 nodes identified on axillary dissection. (no SLN by technetium or methylene blue)  . CARDIAC CATHETERIZATION    . COLONOSCOPY WITH PROPOFOL N/A 12/10/2016   Procedure: COLONOSCOPY WITH PROPOFOL;  Surgeon: DaLucilla LameMD;  Location: [facility] ENDOSCOPY;  Service: Endoscopy;  Laterality: N/A;  . COLONOSCOPY WITH PROPOFOL N/A 02/13/2017   Procedure: COLONOSCOPY WITH PROPOFOL;  Surgeon: WoLucilla LameMD;  [facility];  Service: Endoscopy;  Laterality: N/A;  . ESOPHAGOGASTRODUODENOSCOPY (EGD) WITH PROPOFOL N/A 12/08/2016   Procedure: ESOPHAGOGASTRODUODENOSCOPY (EGD) WITH PROPOFOL;  Surgeon: DaLucilla LameMD;  Location: [facility] END[facility]COPY;  Service: Endoscopy;  Laterality: N/A;  . IVC FILTER INSERTION N/A 02/15/2017   Procedure: IVC Filter Insertion;  Surgeon: DeAlgernon HuxleyMD;  [facility];  Service: Cardiovascular;  Laterality: N/A;  . PORTACATH PLACEMENT Right 12-31-14   Dr ByBary Castilla  FAMILY HISTORY :   Family History  Problem Relation Age of Onset  .  Breast cancer Maternal Aunt   . Breast cancer Cousin   . Brain cancer Maternal Uncle   . Diabetes Mother   . Hypertension Mother   . Stroke Mother     SOCIAL HISTORY:   Social History   Tobacco Use  . Smoking status: Light Tobacco Smoker    Packs/day: 0.50    Years: 18.00    Pack years: 9.00    Types: Cigarettes  . Smokeless tobacco: Never Used  Substance Use Topics  . Alcohol use: No    Alcohol/week: 0.0 standard drinks  . Drug use: No    ALLERGIES:  is allergic to no known  allergies.  MEDICATIONS:  Current Outpatient Medications  Medication Sig Dispense Refill  . acetic acid-hydrocortisone (VOSOL-HC) OTIC solution Place 2 drops into both ears daily as needed. 10 mL 0  . alpelisib (PIQRAY 300MG DAILY DOSE) 2 x 150 MG Therapy Pack Take 2 tablets (391m total) by mouth once daily with food. 60 each 4  . flecainide (TAMBOCOR) 50 MG tablet Take 1 tablet (50 mg total) by mouth 2 (two) times daily. 180 tablet 3  . KLOR-CON M20 20 MEQ tablet TAKE ONE TABLE BY MOUTH TWICE A DAY 180 tablet 1  . levocetirizine (XYZAL) 5 MG tablet Take 1 tablet (5 mg total) by mouth every evening. 30 tablet 3  . magnesium oxide (MAG-OX) 400 MG tablet TAKE 1 TABLET (400 MG TOTAL) BY MOUTH 2 (TWO) TIMES DAILY. 60 tablet 6  . potassium chloride (KLOR-CON) 20 MEQ packet Take by mouth 2 (two) times daily.    . pregabalin (LYRICA) 75 MG capsule Take 1 capsule (75 mg total) by mouth 2 (two) times daily. 180 capsule 0  . triamcinolone ointment (KENALOG) 0.5 % Apply 1 application topically 2 (two) times daily. 30 g 2  . Vitamin D, Ergocalciferol, (DRISDOL) 50000 units CAPS capsule TAKE 1 CAPSULE (50,000 UNITS TOTAL) ONCE A WEEK BY MOUTH. 12 capsule 1   No current facility-administered medications for this visit.    Facility-Administered Medications Ordered in Other Visits  Medication Dose Route Frequency Provider Last Rate Last Dose  . 0.9 %  sodium chloride infusion   Intravenous Continuous Pandit, Sandeep, MD      . 0.9 %  sodium chloride infusion   Intravenous Continuous FLloyd Huger MD 999 mL/hr at 04/24/15 1520    . heparin lock flush 100 unit/mL  500 Units Intravenous Once Berenzon, Dmitriy, MD      . sodium chloride 0.9 % injection 10 mL  10 mL Intravenous PRN PLeia Alf MD   10 mL at 04/04/15 1440  . sodium chloride flush (NS) 0.9 % injection 10 mL  10 mL Intravenous PRN Berenzon, Dmitriy, MD      . Tbo-Filgrastim (GRANIX) injection 480 mcg  480 mcg Subcutaneous Once  BCammie Sickle MD        PHYSICAL EXAMINATION: ECOG PERFORMANCE STATUS: 1 - Symptomatic but completely ambulatory  BP (!) 144/94 (BP Location: Left Arm, Patient Position: Sitting, Cuff Size: Normal)   Pulse 82   Temp 97.6 F (36.4 C) (Tympanic)   Resp 16   Wt 156 lb 9.6 oz (71 kg)   BMI 25.28 kg/m   Filed Weights   10/17/18 0931  Weight: 156 lb 9.6 oz (71 kg)    Physical Exam  Constitutional: She is oriented to person, place, and time and well-developed, well-nourished, and in no distress.  Alone.  Walking herself.  HENT:  Head: Normocephalic and atraumatic.  Mouth/Throat: Oropharynx is clear and moist. No oropharyngeal exudate.  Eyes: Pupils are equal, round, and reactive to light.  Neck: Normal range of motion. Neck supple.  Cardiovascular: Normal rate and regular rhythm.  Pulmonary/Chest: No respiratory distress. She has no wheezes.  Abdominal: Soft. Bowel sounds are normal. She exhibits no distension and no mass. There is no abdominal tenderness. There is no rebound and no guarding.  Musculoskeletal: Normal range of motion.        General: No tenderness or edema.  Neurological: She is alert and oriented to person, place, and time.  Skin: Skin is warm.  Significant hyperpigmentation of the bilateral hand and feet.  No desquamation.  Psychiatric: Affect normal.       LABORATORY DATA:  I have reviewed the data as listed    Component Value Date/Time   NA 139 10/17/2018 0858   NA 135 03/04/2017 0918   NA 135 01/08/2015 0850   K 3.9 10/17/2018 0858   K 3.7 01/08/2015 0850   CL 101 10/17/2018 0858   CL 101 01/08/2015 0850   CO2 27 10/17/2018 0858   CO2 26 01/08/2015 0850   GLUCOSE 84 10/17/2018 0858   GLUCOSE 161 (H) 01/08/2015 0850   BUN 14 10/17/2018 0858   BUN 8 03/04/2017 0918   BUN 17 01/08/2015 0850   CREATININE 0.95 10/17/2018 0858   CREATININE 0.82 01/22/2015 1559   CALCIUM 9.2 10/17/2018 0858   CALCIUM 9.3 01/08/2015 0850   PROT 8.3 (H)  10/17/2018 0858   PROT 6.4 03/04/2017 0918   PROT 6.8 01/22/2015 1559   ALBUMIN 4.3 10/17/2018 0858   ALBUMIN 2.5 (L) 03/04/2017 0918   ALBUMIN 3.9 01/22/2015 1559   AST 45 (H) 10/17/2018 0858   AST 34 01/22/2015 1559   ALT 40 10/17/2018 0858   ALT 42 01/22/2015 1559   ALKPHOS 146 (H) 10/17/2018 0858   ALKPHOS 157 (H) 01/22/2015 1559   BILITOT 1.3 (H) 10/17/2018 0858   BILITOT 6.2 (H) 03/04/2017 0918   BILITOT 0.2 (L) 01/22/2015 1559   GFRNONAA >60 10/17/2018 0858   GFRNONAA >60 01/22/2015 1559   GFRAA >60 10/17/2018 0858   GFRAA >60 01/22/2015 1559    No results found for: SPEP, UPEP  Lab Results  Component Value Date   WBC 3.8 (L) 10/17/2018   NEUTROABS 2.3 10/17/2018   HGB 15.1 (H) 10/17/2018   HCT 46.4 (H) 10/17/2018   MCV 96.1 10/17/2018   PLT 162 10/17/2018      Chemistry      Component Value Date/Time   NA 139 10/17/2018 0858   NA 135 03/04/2017 0918   NA 135 01/08/2015 0850   K 3.9 10/17/2018 0858   K 3.7 01/08/2015 0850   CL 101 10/17/2018 0858   CL 101 01/08/2015 0850   CO2 27 10/17/2018 0858   CO2 26 01/08/2015 0850   BUN 14 10/17/2018 0858   BUN 8 03/04/2017 0918   BUN 17 01/08/2015 0850   CREATININE 0.95 10/17/2018 0858   CREATININE 0.82 01/22/2015 1559      Component Value Date/Time   CALCIUM 9.2 10/17/2018 0858   CALCIUM 9.3 01/08/2015 0850   ALKPHOS 146 (H) 10/17/2018 0858   ALKPHOS 157 (H) 01/22/2015 1559   AST 45 (H) 10/17/2018 0858   AST 34 01/22/2015 1559   ALT 40 10/17/2018 0858   ALT 42 01/22/2015 1559   BILITOT 1.3 (H) 10/17/2018 0858   BILITOT 6.2 (H) 03/04/2017 0918   BILITOT 0.2 (L) 01/22/2015  [place]: I have personally reviewed the radiological images as listed and agreed with the findings in the report. No results found.   ASSESSMENT & PLAN:  Carcinoma of upper-outer quadrant of left breast in female, estrogen receptor positive ([place]) # Recurrent metastatic breast cancer-ER PR positive HER-2  negative; November 14 CT scan shows progressive liver lesions.  Currently on alpelisib  [starting 11/27]+ faslodex.  # continue current therapy. Labs today reviewed;  acceptable for treatment. FBG- 84 this AM. Will plan scan again in mid-end of feb 2020; will order at next visit.    # Diarrhea- G-1; recommend Imodium prn.   #Elevated alkaline phosphatase 140 slowly rising-monitor for now.  Malignancy versus piqray. Stable.   # hand foot syndrome-/hyperpigmentation of hand and feet- G-1 fro xeloda- improving.   # Brain metastases-recurrent/likely leptomeningeal disease;  S/p WBRT [April 2019]. NOv 19th MRI stable-no progressive leptomeningeal disease. Stable.   #Peripheral neuropathy grade 1-  on Lyrica; stable.   # DISPOSITION: # shot today # follow up in 2 weeks; MD/labs- cbc/cmp; ca-27-29- Dr.B   No orders of the defined types were placed in this encounter.  All questions were answered. The patient knows to call the clinic with any problems, questions or concerns.      Cammie Sickle, MD 10/17/2018 10:02 AM

## 2018-10-31 ENCOUNTER — Encounter: Payer: Self-pay | Admitting: Internal Medicine

## 2018-10-31 ENCOUNTER — Inpatient Hospital Stay (HOSPITAL_BASED_OUTPATIENT_CLINIC_OR_DEPARTMENT_OTHER): Payer: BLUE CROSS/BLUE SHIELD | Admitting: Internal Medicine

## 2018-10-31 ENCOUNTER — Inpatient Hospital Stay: Payer: BLUE CROSS/BLUE SHIELD | Attending: Internal Medicine

## 2018-10-31 ENCOUNTER — Other Ambulatory Visit: Payer: Self-pay

## 2018-10-31 VITALS — BP 135/84 | HR 76 | Temp 98.0°F | Resp 20 | Ht 66.0 in | Wt 154.6 lb

## 2018-10-31 DIAGNOSIS — C50412 Malignant neoplasm of upper-outer quadrant of left female breast: Secondary | ICD-10-CM | POA: Diagnosis not present

## 2018-10-31 DIAGNOSIS — Z7902 Long term (current) use of antithrombotics/antiplatelets: Secondary | ICD-10-CM | POA: Diagnosis not present

## 2018-10-31 DIAGNOSIS — R748 Abnormal levels of other serum enzymes: Secondary | ICD-10-CM | POA: Insufficient documentation

## 2018-10-31 DIAGNOSIS — C787 Secondary malignant neoplasm of liver and intrahepatic bile duct: Secondary | ICD-10-CM | POA: Insufficient documentation

## 2018-10-31 DIAGNOSIS — I252 Old myocardial infarction: Secondary | ICD-10-CM | POA: Diagnosis not present

## 2018-10-31 DIAGNOSIS — C7931 Secondary malignant neoplasm of brain: Secondary | ICD-10-CM | POA: Diagnosis not present

## 2018-10-31 DIAGNOSIS — K1230 Oral mucositis (ulcerative), unspecified: Secondary | ICD-10-CM | POA: Insufficient documentation

## 2018-10-31 DIAGNOSIS — F17210 Nicotine dependence, cigarettes, uncomplicated: Secondary | ICD-10-CM

## 2018-10-31 DIAGNOSIS — G629 Polyneuropathy, unspecified: Secondary | ICD-10-CM | POA: Diagnosis not present

## 2018-10-31 DIAGNOSIS — C7949 Secondary malignant neoplasm of other parts of nervous system: Secondary | ICD-10-CM | POA: Diagnosis not present

## 2018-10-31 DIAGNOSIS — C7951 Secondary malignant neoplasm of bone: Secondary | ICD-10-CM | POA: Diagnosis not present

## 2018-10-31 DIAGNOSIS — I471 Supraventricular tachycardia: Secondary | ICD-10-CM

## 2018-10-31 DIAGNOSIS — M8580 Other specified disorders of bone density and structure, unspecified site: Secondary | ICD-10-CM

## 2018-10-31 DIAGNOSIS — I10 Essential (primary) hypertension: Secondary | ICD-10-CM | POA: Diagnosis not present

## 2018-10-31 DIAGNOSIS — Z5111 Encounter for antineoplastic chemotherapy: Secondary | ICD-10-CM | POA: Diagnosis not present

## 2018-10-31 DIAGNOSIS — Z79899 Other long term (current) drug therapy: Secondary | ICD-10-CM | POA: Insufficient documentation

## 2018-10-31 DIAGNOSIS — Z170 Estrogen receptor positive status [ER+]: Secondary | ICD-10-CM

## 2018-10-31 DIAGNOSIS — R739 Hyperglycemia, unspecified: Secondary | ICD-10-CM | POA: Diagnosis not present

## 2018-10-31 DIAGNOSIS — Z86711 Personal history of pulmonary embolism: Secondary | ICD-10-CM

## 2018-10-31 LAB — COMPREHENSIVE METABOLIC PANEL
ALT: 39 U/L (ref 0–44)
AST: 39 U/L (ref 15–41)
Albumin: 4.2 g/dL (ref 3.5–5.0)
Alkaline Phosphatase: 146 U/L — ABNORMAL HIGH (ref 38–126)
Anion gap: 8 (ref 5–15)
BILIRUBIN TOTAL: 1.2 mg/dL (ref 0.3–1.2)
BUN: 9 mg/dL (ref 8–23)
CHLORIDE: 105 mmol/L (ref 98–111)
CO2: 27 mmol/L (ref 22–32)
Calcium: 8.9 mg/dL (ref 8.9–10.3)
Creatinine, Ser: 0.92 mg/dL (ref 0.44–1.00)
GFR calc Af Amer: >60 mL/min (ref >60)
Glucose, Bld: 141 mg/dL — ABNORMAL HIGH (ref 70–99)
Potassium: 3.2 mmol/L — ABNORMAL LOW (ref 3.5–5.1)
Sodium: 140 mmol/L (ref 135–145)
Total Protein: 7.8 g/dL (ref 6.5–8.1)

## 2018-10-31 LAB — CBC WITH DIFFERENTIAL/PLATELET
Abs Immature Granulocytes: 0 10*3/uL (ref 0.00–0.07)
BASOS ABS: 0 10*3/uL (ref 0.0–0.1)
Basophils Relative: 1 %
Eosinophils Absolute: 0.3 10*3/uL (ref 0.0–0.5)
Eosinophils Relative: 9 %
HCT: 43.7 % (ref 36.0–46.0)
Hemoglobin: 14.3 g/dL (ref 12.0–15.0)
Immature Granulocytes: 0 %
Lymphocytes Relative: 33 %
Lymphs Abs: 1 10*3/uL (ref 0.7–4.0)
MCH: 30.7 pg (ref 26.0–34.0)
MCHC: 32.7 g/dL (ref 30.0–36.0)
MCV: 93.8 fL (ref 80.0–100.0)
Monocytes Absolute: 0.2 10*3/uL (ref 0.1–1.0)
Monocytes Relative: 8 %
NRBC: 0 % (ref 0.0–0.2)
Neutro Abs: 1.5 10*3/uL — ABNORMAL LOW (ref 1.7–7.7)
Neutrophils Relative %: 49 %
Platelets: 174 10*3/uL (ref 150–400)
RBC: 4.66 MIL/uL (ref 3.87–5.11)
RDW: 14.3 % (ref 11.5–15.5)
WBC: 3.1 10*3/uL — ABNORMAL LOW (ref 4.0–10.5)

## 2018-10-31 MED FILL — PIQRAY 300MG DAILY DOSE 2X1: 2 X 150 | 28 days supply | Qty: 56 | Fill #1

## 2018-10-31 NOTE — Progress Notes (Signed)
[place] OFFICE PROGRESS NOTE  Patient Care Team: Steele Sizer, MD as PCP - General (Family Medicine) Wellington Hampshire, MD as PCP - Cardiology (Cardiology) Bary Castilla, Forest Gleason, MD (General Surgery) [name], MD (Inactive) as Attending Physician (Internal Medicine)  Cancer Staging No matching staging information was found for the patient.   Oncology History   # LEFT BREAST IDC; STAGE II [cT2N1] ER >90%; PR- 50-90%; her 2 NEU- POS; s/p Neoadj chemo; AUG 2016- TCH+P s/p Lumpec & partial ALND- path CR; s/p RT [finished Nov 2016];  adj Herceptin; HELD for Jan 19th 2017 [in DEC 2016-EF dropped from 63 to 51%; FEB 27th EF-42.9%]; JAN 2016 START Arimidex; May 2017- EF- 60%; May 24th 2017-Re-start Herceptin q 3W; July 28th STOP herceptin [finished 16m/m2; sec to Low EF; however 2017 Oct EF= 67%; improved]  # DEC 4th 2017- Start Neratinib 4 pills; DEC 11th 3 pills; STOPPED.  # FEB 2018- RECURRENCE ER/PR positive; Her 2 NEGATIVE [liver Bx]  # MARCH 1st 2018- Tax-Cytoxan [poor tol]  # FEB 2018- Brain mets [SBRT; Dr.Crystal; finished Feb 28th 2018]  # March 2018- Eribulin- poor tolerance  # June 8th 2018- Started Faslodex + Abema [abema-multiple interruptions sec to neutropenia]  # MRI Brain- March 2019- Leptomeningeal mets [WBRT; No LP s/p WBRT- 01/09/2018]; STOP Abema [524mday-sec to severe cytopenia]+faslodex  # MAY 20tth 019- Start Xeloda; STOPPED in Nov 22nd 2019- sec to Progressive liver lesions on CT scan  # NOV 26th 2019- Faslodex + Piqaray 2501m; Jan 2020- Piqray 300 mg+ faslodex   # PN- G-2; may 2017- Cymbalta 5m30mMARCH 2018-  acute vascular insuff of BIL LE [? Taxotere vasoconstriction on asprin]; # hemorragic shock LGIB [s/p colo; Dr.Wohl]  #  SVT- on flecainide.   # April 2016- Liver Bx- NEG;   # Drop in EF from Herceptin [recovered OCt 27th- EF 65%]  # BMD- jan 2017- osteopenia  -----------------------------------------------------------      MOLEWilder- Sep 4th 2018- PDL-1 0%/NEG for BRCA; MSI-Stable; Positive for PI3K; ccnd-1; FGF** -----------------------------------------------------------  Dx: Metastatic Breast cancer [ER/PR-Pos; her -2 NEG] Stage IV; goals: palliative Current treatment- Faslodex + Piqaray [11/27]     Carcinoma of upper-outer quadrant of left breast in female, estrogen receptor positive (HCC)[place] INTERVAL HISTORY:  BrenBARBEE [id number].  female pleasant patient above history of metastatic breast cancer ER PR positive/leptomeningeal disease metastatic to [place] started on Faslodex plus Piqray is here for follow-up.  Patient complains of mild soreness in the mouth.  Denies any significant nausea vomiting.  Denies any new onset of headaches.  1-2 loose stools a day.  She is noting.  No fevers or chills.  Mild fatigue not any worse.  Review of Systems  Constitutional: Positive for malaise/fatigue. Negative for chills, diaphoresis, fever and weight loss.  HENT: Negative for nosebleeds and sore throat.   Eyes: Negative for double vision.  Respiratory: Negative for cough, hemoptysis, sputum production, shortness of breath and wheezing.   Cardiovascular: Negative for chest pain, palpitations, orthopnea and leg swelling.  Gastrointestinal: Negative for abdominal pain, blood in stool, constipation, heartburn, melena, nausea and vomiting.  Genitourinary: Negative for dysuria, frequency and urgency.  Musculoskeletal: Positive for joint pain. Negative for back pain.  Skin: Negative for itching.  Neurological: Positive for tingling. Negative for dizziness, focal weakness, weakness and headaches.  Endo/Heme/Allergies: Does not bruise/bleed easily.  Psychiatric/Behavioral: Negative for depression. The patient is not nervous/anxious and does not have insomnia.  PAST MEDICAL HISTORY :  Past Medical History:  Diagnosis Date  . Chemotherapy-induced peripheral neuropathy  ([place])   . Epistaxis    a. 11/2016 in setting of asa/plavix-->silver nitrate cauterization.  . GI bleed    a. 11/2016 Admission w/ GIB and hypovolemic shock req 3u PRBC's;  b. 11/2016 ECG: gastritis & nonbleeding peptic ulcer; c. 11/2016 Conlonoscopy: rectal and sigmoid colonic ulcers.  . Heart attack ([place])    a. 1998 Cath @ [facility]: reportedly no intervention required.  Marland Kitchen Herceptin-induced cardiomyopathy ([place])    a. In the setting of Herceptin Rx for breast cancer (initiated 12/2014); b. 03/2015 MUGA EF 64%; b. 08/2015 MUGA: EF 51%; c. 10/2015 MUGA: EF 44%; d. 11/2015 Echo: EF 45-50%; e. 01/2016 MUGA: EF 60%; f. 06/2016 MUGA EF 65%; g. 10/2016 MUGA: EF 61%;  h. 12/2016 Echo: EF 55-60%, gr1 DD.  Marland Kitchen Hyperlipidemia   . Hypertension   . Neuropathy   . Possible PAD (peripheral artery disease) ([place])    a. 11/2016 LE cyanosis and weak pulses-->CTA w/o significant Ao-BiFem dzs. ? distal dzs-->ASA/Plavix initiated by vascular surgery.  Marland Kitchen PSVT (paroxysmal supraventricular tachycardia) ([place])    a. Dx 11/2016.  . Pulmonary embolism ([place])    a. 12/2016 CTA Chest: small nonocclusive PE in inferior segment of the Left lingula, somewhat eccentric filling defect suggesting chronic rather than acute embolic event; b. 11/7900 LE U/S:  No DVT; c. 12/2016 Echo: Nl RV fxn, nl PASP.  Marland Kitchen Recurrent Metastatic breast cancer ([place])    a. Dx 2016: Stage II, ER positive, PR positive, HER-2/neu overexpressing of the left breast-->chemo/radiation; b. 10/2016 CT Abd/pelvis: diffuse liver mets, ill defined sclerotic bone lesions-T12;  c. 10/2016 MRI brain: metastatic lesion along L temporal lobe (19x29m) w/ extensive surrounding edema & 596mmidline shift to right.  . Sinus tachycardia     PAST SURGICAL HISTORY :   Past Surgical History:  Procedure Laterality Date  . BREAST BIOPSY Left 2016   Positive  . BREAST LUMPECTOMY WITH SENTINEL LYMPH NODE BIOPSY Left 05/23/2015   Procedure: LEFT BREAST WIDE EXCISION WITH AXILLARY DISSECTION, MASTOPLASTY  ;  Surgeon: JeRobert BellowMD;  Location: [facility] ORS;  Service: General;  Laterality: Left;  . BREAST SURGERY Left 12/18/14   breast biopsy/INVASIVE DUCTAL CARCINOMA OF BREAST, NOTTINGHAM GRADE 2.  . Marland KitchenREAST SURGERY  05/23/2015.   Wide excision/mastoplasty, axillary dissection. No residual invasive cancer, positive for residual DCIS. 0/2 nodes identified on axillary dissection. (no SLN by technetium or methylene blue)  . CARDIAC CATHETERIZATION    . COLONOSCOPY WITH PROPOFOL N/A 12/10/2016   Procedure: COLONOSCOPY WITH PROPOFOL;  Surgeon: DaLucilla LameMD;  Location: [facility] ENDOSCOPY;  Service: Endoscopy;  Laterality: N/A;  . COLONOSCOPY WITH PROPOFOL N/A 02/13/2017   Procedure: COLONOSCOPY WITH PROPOFOL;  Surgeon: WoLucilla LameMD;  [facility];  Service: Endoscopy;  Laterality: N/A;  . ESOPHAGOGASTRODUODENOSCOPY (EGD) WITH PROPOFOL N/A 12/08/2016   Procedure: ESOPHAGOGASTRODUODENOSCOPY (EGD) WITH PROPOFOL;  Surgeon: DaLucilla LameMD;  Location: [facility] ENDOSCOPY;  Service: Endoscopy;  Laterality: N/A;  . IVC FILTER INSERTION N/A 02/15/2017   Procedure: IVC Filter Insertion;  Surgeon: DeAlgernon HuxleyMD;  [facility];  Service: Cardiovascular;  Laterality: N/A;  . PORTACATH PLACEMENT Right 12-31-14   Dr ByBary Castilla  FAMILY HISTORY :   Family History  Problem Relation Age of Onset  . Breast cancer Maternal Aunt   . Breast cancer Cousin   . Brain cancer Maternal Uncle   .  Diabetes Mother   . Hypertension Mother   . Stroke Mother     SOCIAL HISTORY:   Social History   Tobacco Use  . Smoking status: Light Tobacco Smoker    Packs/day: 0.50    Years: 18.00    Pack years: 9.00    Types: Cigarettes  . Smokeless tobacco: Never Used  Substance Use Topics  . Alcohol use: No    Alcohol/week: 0.0 standard drinks  . Drug use: No    ALLERGIES:  is allergic to no known allergies.  MEDICATIONS:  Current Outpatient Medications  Medication Sig Dispense Refill  .  alpelisib (PIQRAY 300MG DAILY DOSE) 2 x 150 MG Therapy Pack Take 2 tablets (372m total) by mouth once daily with food. 60 each 4  . flecainide (TAMBOCOR) 50 MG tablet Take 1 tablet (50 mg total) by mouth 2 (two) times daily. 180 tablet 3  . levocetirizine (XYZAL) 5 MG tablet Take 1 tablet (5 mg total) by mouth every evening. 30 tablet 3  . pregabalin (LYRICA) 75 MG capsule Take 1 capsule (75 mg total) by mouth 2 (two) times daily. 180 capsule 0  . triamcinolone ointment (KENALOG) 0.5 % Apply 1 application topically 2 (two) times daily. 30 g 2  . Vitamin D, Ergocalciferol, (DRISDOL) 50000 units CAPS capsule TAKE 1 CAPSULE (50,000 UNITS TOTAL) ONCE A WEEK BY MOUTH. 12 capsule 1  . magnesium oxide (MAG-OX) 400 MG tablet TAKE 1 TABLET (400 MG TOTAL) BY MOUTH 2 (TWO) TIMES DAILY. (Patient not taking: Reported on 10/31/2018) 60 tablet 6   No current facility-administered medications for this visit.    Facility-Administered Medications Ordered in Other Visits  Medication Dose Route Frequency Provider Last Rate Last Dose  . 0.9 %  sodium chloride infusion   Intravenous Continuous Pandit, Sandeep, MD      . 0.9 %  sodium chloride infusion   Intravenous Continuous FLloyd Huger MD 999 mL/hr at 04/24/15 1520    . heparin lock flush 100 unit/mL  500 Units Intravenous Once Berenzon, Dmitriy, MD      . sodium chloride 0.9 % injection 10 mL  10 mL Intravenous PRN PLeia Alf MD   10 mL at 04/04/15 1440  . sodium chloride flush (NS) 0.9 % injection 10 mL  10 mL Intravenous PRN Berenzon, Dmitriy, MD      . Tbo-Filgrastim (GRANIX) injection 480 mcg  480 mcg Subcutaneous Once BCammie Sickle MD        PHYSICAL EXAMINATION: ECOG PERFORMANCE STATUS: 1 - Symptomatic but completely ambulatory  BP 135/84 (Patient Position: Sitting)   Pulse 76   Temp 98 F (36.7 C) (Oral)   Resp 20   Ht _0  (1.676 m)   Wt 154 lb 9.6 oz (70.1 kg)   BMI 24.95 kg/m   Filed Weights   10/31/18 0921  Weight:  154 lb 9.6 oz (70.1 kg)    Physical Exam  Constitutional: She is oriented to person, place, and time and well-developed, well-nourished, and in no distress.  Alone.  Walking herself.  HENT:  Head: Normocephalic and atraumatic.  Mouth/Throat: Oropharynx is clear and moist. No oropharyngeal exudate.  Eyes: Pupils are equal, round, and reactive to light.  Neck: Normal range of motion. Neck supple.  Cardiovascular: Normal rate and regular rhythm.  Pulmonary/Chest: No respiratory distress. She has no wheezes.  Abdominal: Soft. Bowel sounds are normal. She exhibits no distension and no mass. There is no abdominal tenderness. There is no rebound and no guarding.  Musculoskeletal: Normal range of motion.        General: No tenderness or edema.  Neurological: She is alert and oriented to person, place, and time.  Skin: Skin is warm.  hyperpigmentation of the bilateral hand and feet (better)  Psychiatric: Affect normal.       LABORATORY DATA:  I have reviewed the data as listed    Component Value Date/Time   NA 140 10/31/2018 0903   NA 135 03/04/2017 0918   NA 135 01/08/2015 0850   K 3.2 (L) 10/31/2018 0903   K 3.7 01/08/2015 0850   CL 105 10/31/2018 0903   CL 101 01/08/2015 0850   CO2 27 10/31/2018 0903   CO2 26 01/08/2015 0850   GLUCOSE 141 (H) 10/31/2018 0903   GLUCOSE 161 (H) 01/08/2015 0850   BUN 9 10/31/2018 0903   BUN 8 03/04/2017 0918   BUN 17 01/08/2015 0850   CREATININE 0.92 10/31/2018 0903   CREATININE 0.82 01/22/2015 1559   CALCIUM 8.9 10/31/2018 0903   CALCIUM 9.3 01/08/2015 0850   PROT 7.8 10/31/2018 0903   PROT 6.4 03/04/2017 0918   PROT 6.8 01/22/2015 1559   ALBUMIN 4.2 10/31/2018 0903   ALBUMIN 2.5 (L) 03/04/2017 0918   ALBUMIN 3.9 01/22/2015 1559   AST 39 10/31/2018 0903   AST 34 01/22/2015 1559   ALT 39 10/31/2018 0903   ALT 42 01/22/2015 1559   ALKPHOS 146 (H) 10/31/2018 0903   ALKPHOS 157 (H) 01/22/2015 1559   BILITOT 1.2 10/31/2018 0903    BILITOT 6.2 (H) 03/04/2017 0918   BILITOT 0.2 (L) 01/22/2015 1559   GFRNONAA >60 10/31/2018 0903   GFRNONAA >60 01/22/2015 1559   GFRAA >60 10/31/2018 0903   GFRAA >60 01/22/2015 1559    No results found for: SPEP, UPEP  Lab Results  Component Value Date   WBC 3.1 (L) 10/31/2018   NEUTROABS 1.5 (L) 10/31/2018   HGB 14.3 10/31/2018   HCT 43.7 10/31/2018   MCV 93.8 10/31/2018   PLT 174 10/31/2018      Chemistry      Component Value Date/Time   NA 140 10/31/2018 0903   NA 135 03/04/2017 0918   NA 135 01/08/2015 0850   K 3.2 (L) 10/31/2018 0903   K 3.7 01/08/2015 0850   CL 105 10/31/2018 0903   CL 101 01/08/2015 0850   CO2 27 10/31/2018 0903   CO2 26 01/08/2015 0850   BUN 9 10/31/2018 0903   BUN 8 03/04/2017 0918   BUN 17 01/08/2015 0850   CREATININE 0.92 10/31/2018 0903   CREATININE 0.82 01/22/2015 1559      Component Value Date/Time   CALCIUM 8.9 10/31/2018 0903   CALCIUM 9.3 01/08/2015 0850   ALKPHOS 146 (H) 10/31/2018 0903   ALKPHOS 157 (H) 01/22/2015 1559   AST 39 10/31/2018 0903   AST 34 01/22/2015 1559   ALT 39 10/31/2018 0903   ALT 42 01/22/2015 1559   BILITOT 1.2 10/31/2018 0903   BILITOT 6.2 (H) 03/04/2017 0918   BILITOT 0.2 (L) 01/22/2015 1559       RADIOGRAPHIC STUDIES: I have personally reviewed the radiological images as listed and agreed with the findings in the report. No results found.   ASSESSMENT & PLAN:  Carcinoma of upper-outer quadrant of left breast in female, estrogen receptor positive ([place]) # Recurrent metastatic breast cancer-ER PR positive HER-2 negative; November 14 CT scan shows progressive liver lesions.  Currently on alpelisib  [starting 11/27]+ faslodex.  # continue  current therapy. Labs today reviewed;  acceptable for treatment. FBG- 141 [ see below ]. will plan to get imaging prior to next visit; order scans today.  #Blood sugar 141 fasting-secondary to piqray; monitor closely.  Recommend repeating BMP in 1 week.  #  Diarrhea- G-1; stable. recommend Imodium prn.   # Mucositis- G-1.  Recommend salt intake and soda rinses.  #Elevated alkaline phosphatase 140.  Malignancy versus piqray. Stable.   # hand foot syndrome-/hyperpigmentation of hand and feet- G-1 fro xeloda-improving.   # Brain metastases-recurrent/likely leptomeningeal disease;  S/p WBRT [April 2019]. NOv 19th MRI stable-no progressive leptomeningeal disease. Stable.   #Peripheral neuropathy grade 1-  on Lyrica; stable.   # DISPOSITION: # lab- bmp in 1 week. # follow up in 2 weeks; MD/labs- cbc/cmp; ca-27-29; faslodex; CT a/p prior- Dr.B   Orders Placed This Encounter  Procedures  . CT Abdomen Pelvis W Contrast    Standing Status:   Future    Standing Expiration Date:   10/31/2019    Order Specific Question:   ** REASON FOR EXAM (FREE TEXT)    Answer:   breast cancer; liver lesions    Order Specific Question:   If indicated for the ordered procedure, I authorize the administration of contrast media per Radiology protocol    Answer:   Yes    Order Specific Question:   Preferred imaging location?    Answer:   [facility]    Order Specific Question:   Is Oral Contrast requested for this exam?    Answer:   Yes, Per Radiology protocol    Order Specific Question:   Radiology Contrast Protocol - do NOT remove file path    Answer:   [email or web address]   All questions were answered. The patient knows to call the clinic with any problems, questions or concerns.      Cammie Sickle, MD 10/31/2018 9:55 AM

## 2018-10-31 NOTE — Addendum Note (Signed)
Addended by: Sabino Gasser on: 10/31/2018 10:12 AM   Modules accepted: Orders

## 2018-10-31 NOTE — Assessment & Plan Note (Addendum)
#  Recurrent metastatic breast cancer-ER PR positive HER-2 negative; November 14 CT scan shows progressive liver lesions.  Currently on alpelisib  [starting 11/27]+ faslodex.  # continue current therapy. Labs today reviewed;  acceptable for treatment. FBG- 141 [ see below ]. will plan to get imaging prior to next visit; order scans today.  #Blood sugar 141 fasting-secondary to piqray; monitor closely.  Recommend repeating BMP in 1 week.  # Diarrhea- G-1; stable. recommend Imodium prn.   # Mucositis- G-1.  Recommend salt intake and soda rinses.  #Elevated alkaline phosphatase 140.  Malignancy versus piqray. Stable.   # hand foot syndrome-/hyperpigmentation of hand and feet- G-1 fro xeloda-improving.   # Brain metastases-recurrent/likely leptomeningeal disease;  S/p WBRT [April 2019]. NOv 19th MRI stable-no progressive leptomeningeal disease. Stable.   #Peripheral neuropathy grade 1-  on Lyrica; stable.   # DISPOSITION: # lab- bmp in 1 week. # follow up in 2 weeks; MD/labs- cbc/cmp; ca-27-29; faslodex; CT a/p prior- Dr.B  Addendum: on feb 10th- Patient's repeat fasting blood glucose 296.  We will start the patient on metformin 500 mg twice a day.  We will plan to hold piqray/metformin the day of CT scan-February 17. Discussed with phrmacist.

## 2018-11-01 LAB — CANCER ANTIGEN 27.29: CA 27.29: 21.5 U/mL (ref 0.0–38.6)

## 2018-11-07 ENCOUNTER — Inpatient Hospital Stay: Payer: BLUE CROSS/BLUE SHIELD

## 2018-11-07 DIAGNOSIS — C50412 Malignant neoplasm of upper-outer quadrant of left female breast: Secondary | ICD-10-CM | POA: Diagnosis not present

## 2018-11-07 DIAGNOSIS — C7949 Secondary malignant neoplasm of other parts of nervous system: Secondary | ICD-10-CM | POA: Diagnosis not present

## 2018-11-07 DIAGNOSIS — M8580 Other specified disorders of bone density and structure, unspecified site: Secondary | ICD-10-CM | POA: Diagnosis not present

## 2018-11-07 DIAGNOSIS — F17210 Nicotine dependence, cigarettes, uncomplicated: Secondary | ICD-10-CM | POA: Diagnosis not present

## 2018-11-07 DIAGNOSIS — I10 Essential (primary) hypertension: Secondary | ICD-10-CM | POA: Diagnosis not present

## 2018-11-07 DIAGNOSIS — R739 Hyperglycemia, unspecified: Secondary | ICD-10-CM | POA: Diagnosis not present

## 2018-11-07 DIAGNOSIS — K1230 Oral mucositis (ulcerative), unspecified: Secondary | ICD-10-CM | POA: Diagnosis not present

## 2018-11-07 DIAGNOSIS — G629 Polyneuropathy, unspecified: Secondary | ICD-10-CM | POA: Diagnosis not present

## 2018-11-07 DIAGNOSIS — C787 Secondary malignant neoplasm of liver and intrahepatic bile duct: Secondary | ICD-10-CM | POA: Diagnosis not present

## 2018-11-07 DIAGNOSIS — Z170 Estrogen receptor positive status [ER+]: Principal | ICD-10-CM

## 2018-11-07 DIAGNOSIS — C7951 Secondary malignant neoplasm of bone: Secondary | ICD-10-CM | POA: Diagnosis not present

## 2018-11-07 DIAGNOSIS — I252 Old myocardial infarction: Secondary | ICD-10-CM | POA: Diagnosis not present

## 2018-11-07 DIAGNOSIS — I471 Supraventricular tachycardia: Secondary | ICD-10-CM | POA: Diagnosis not present

## 2018-11-07 DIAGNOSIS — C7931 Secondary malignant neoplasm of brain: Secondary | ICD-10-CM | POA: Diagnosis not present

## 2018-11-07 DIAGNOSIS — R748 Abnormal levels of other serum enzymes: Secondary | ICD-10-CM | POA: Diagnosis not present

## 2018-11-07 DIAGNOSIS — Z5111 Encounter for antineoplastic chemotherapy: Secondary | ICD-10-CM | POA: Diagnosis not present

## 2018-11-07 LAB — COMPREHENSIVE METABOLIC PANEL
ALT: 57 U/L — ABNORMAL HIGH (ref 0–44)
AST: 56 U/L — ABNORMAL HIGH (ref 15–41)
Albumin: 4.5 g/dL (ref 3.5–5.0)
Alkaline Phosphatase: 171 U/L — ABNORMAL HIGH (ref 38–126)
Anion gap: 8 (ref 5–15)
BUN: 11 mg/dL (ref 8–23)
CHLORIDE: 100 mmol/L (ref 98–111)
CO2: 29 mmol/L (ref 22–32)
Calcium: 9.5 mg/dL (ref 8.9–10.3)
Creatinine, Ser: 0.95 mg/dL (ref 0.44–1.00)
GFR calc Af Amer: >60 mL/min (ref >60)
GFR calc non Af Amer: >60 mL/min (ref >60)
Glucose, Bld: 296 mg/dL — ABNORMAL HIGH (ref 70–99)
POTASSIUM: 3.4 mmol/L — AB (ref 3.5–5.1)
Sodium: 137 mmol/L (ref 135–145)
Total Bilirubin: 1.4 mg/dL — ABNORMAL HIGH (ref 0.3–1.2)
Total Protein: 8.6 g/dL — ABNORMAL HIGH (ref 6.5–8.1)

## 2018-11-07 LAB — CBC WITH DIFFERENTIAL/PLATELET
Abs Immature Granulocytes: 0.01 10*3/uL (ref 0.00–0.07)
BASOS ABS: 0 10*3/uL (ref 0.0–0.1)
Basophils Relative: 1 %
Eosinophils Absolute: 0.2 10*3/uL (ref 0.0–0.5)
Eosinophils Relative: 5 %
HCT: 45.6 % (ref 36.0–46.0)
Hemoglobin: 14.9 g/dL (ref 12.0–15.0)
IMMATURE GRANULOCYTES: 0 %
Lymphocytes Relative: 29 %
Lymphs Abs: 1 10*3/uL (ref 0.7–4.0)
MCH: 30.5 pg (ref 26.0–34.0)
MCHC: 32.7 g/dL (ref 30.0–36.0)
MCV: 93.3 fL (ref 80.0–100.0)
Monocytes Absolute: 0.2 10*3/uL (ref 0.1–1.0)
Monocytes Relative: 6 %
NRBC: 0 % (ref 0.0–0.2)
Neutro Abs: 2 10*3/uL (ref 1.7–7.7)
Neutrophils Relative %: 59 %
Platelets: 152 10*3/uL (ref 150–400)
RBC: 4.89 MIL/uL (ref 3.87–5.11)
RDW: 14 % (ref 11.5–15.5)
WBC: 3.4 10*3/uL — ABNORMAL LOW (ref 4.0–10.5)

## 2018-11-07 MED ORDER — METFORMIN HCL 500 MG PO TABS: 500.0000 mg | ORAL_TABLET | Freq: Two times a day (BID) | ORAL | 4 refills | Status: DC

## 2018-11-07 NOTE — Addendum Note (Signed)
Addended by: Cammie Sickle on: 11/07/2018 01:29 PM   Modules accepted: Orders

## 2018-11-08 LAB — CANCER ANTIGEN 27.29: CA 27.29: 29.2 U/mL (ref 0.0–38.6)

## 2018-11-14 ENCOUNTER — Inpatient Hospital Stay: Payer: BLUE CROSS/BLUE SHIELD | Admitting: Internal Medicine

## 2018-11-14 ENCOUNTER — Ambulatory Visit
Admission: RE | Admit: 2018-11-14 | Discharge: 2018-11-14 | Disposition: A | Discharge status: Home / Self Care | Payer: BLUE CROSS/BLUE SHIELD | Source: Ambulatory Visit | Attending: Internal Medicine | Admitting: Internal Medicine

## 2018-11-14 DIAGNOSIS — C787 Secondary malignant neoplasm of liver and intrahepatic bile duct: Secondary | ICD-10-CM | POA: Diagnosis not present

## 2018-11-14 DIAGNOSIS — Z170 Estrogen receptor positive status [ER+]: Secondary | ICD-10-CM | POA: Diagnosis not present

## 2018-11-14 DIAGNOSIS — C50412 Malignant neoplasm of upper-outer quadrant of left female breast: Secondary | ICD-10-CM | POA: Diagnosis not present

## 2018-11-14 DIAGNOSIS — C50912 Malignant neoplasm of unspecified site of left female breast: Secondary | ICD-10-CM | POA: Diagnosis not present

## 2018-11-14 MED ORDER — IOPAMIDOL (ISOVUE-300) INJECTION 61%
85.0000 mL | Freq: Once | INTRAVENOUS | Status: AC | PRN
  Administered 2018-11-14: 85 mL via INTRAVENOUS

## 2018-11-15 ENCOUNTER — Other Ambulatory Visit: Payer: Self-pay

## 2018-11-15 ENCOUNTER — Encounter: Payer: Self-pay | Admitting: Internal Medicine

## 2018-11-15 ENCOUNTER — Inpatient Hospital Stay (HOSPITAL_BASED_OUTPATIENT_CLINIC_OR_DEPARTMENT_OTHER): Payer: BLUE CROSS/BLUE SHIELD | Admitting: Internal Medicine

## 2018-11-15 ENCOUNTER — Inpatient Hospital Stay: Payer: BLUE CROSS/BLUE SHIELD

## 2018-11-15 VITALS — BP 126/78 | HR 118 | Temp 96.9°F | Resp 16 | Wt 148.0 lb

## 2018-11-15 DIAGNOSIS — I252 Old myocardial infarction: Secondary | ICD-10-CM

## 2018-11-15 DIAGNOSIS — F17210 Nicotine dependence, cigarettes, uncomplicated: Secondary | ICD-10-CM

## 2018-11-15 DIAGNOSIS — Z170 Estrogen receptor positive status [ER+]: Secondary | ICD-10-CM | POA: Diagnosis not present

## 2018-11-15 DIAGNOSIS — C7951 Secondary malignant neoplasm of bone: Secondary | ICD-10-CM | POA: Diagnosis not present

## 2018-11-15 DIAGNOSIS — R748 Abnormal levels of other serum enzymes: Secondary | ICD-10-CM

## 2018-11-15 DIAGNOSIS — C787 Secondary malignant neoplasm of liver and intrahepatic bile duct: Secondary | ICD-10-CM | POA: Diagnosis not present

## 2018-11-15 DIAGNOSIS — K1230 Oral mucositis (ulcerative), unspecified: Secondary | ICD-10-CM | POA: Diagnosis not present

## 2018-11-15 DIAGNOSIS — I10 Essential (primary) hypertension: Secondary | ICD-10-CM

## 2018-11-15 DIAGNOSIS — Z5111 Encounter for antineoplastic chemotherapy: Secondary | ICD-10-CM | POA: Diagnosis not present

## 2018-11-15 DIAGNOSIS — C50412 Malignant neoplasm of upper-outer quadrant of left female breast: Secondary | ICD-10-CM

## 2018-11-15 DIAGNOSIS — C7931 Secondary malignant neoplasm of brain: Secondary | ICD-10-CM | POA: Diagnosis not present

## 2018-11-15 DIAGNOSIS — I471 Supraventricular tachycardia: Secondary | ICD-10-CM | POA: Diagnosis not present

## 2018-11-15 DIAGNOSIS — G629 Polyneuropathy, unspecified: Secondary | ICD-10-CM

## 2018-11-15 DIAGNOSIS — Z86711 Personal history of pulmonary embolism: Secondary | ICD-10-CM

## 2018-11-15 DIAGNOSIS — R739 Hyperglycemia, unspecified: Secondary | ICD-10-CM | POA: Diagnosis not present

## 2018-11-15 DIAGNOSIS — Z79899 Other long term (current) drug therapy: Secondary | ICD-10-CM

## 2018-11-15 DIAGNOSIS — Z7902 Long term (current) use of antithrombotics/antiplatelets: Secondary | ICD-10-CM

## 2018-11-15 DIAGNOSIS — M8580 Other specified disorders of bone density and structure, unspecified site: Secondary | ICD-10-CM | POA: Diagnosis not present

## 2018-11-15 DIAGNOSIS — C7949 Secondary malignant neoplasm of other parts of nervous system: Secondary | ICD-10-CM | POA: Diagnosis not present

## 2018-11-15 LAB — COMPREHENSIVE METABOLIC PANEL
ALT: 29 U/L (ref 0–44)
AST: 40 U/L (ref 15–41)
Albumin: 4.5 g/dL (ref 3.5–5.0)
Alkaline Phosphatase: 141 U/L — ABNORMAL HIGH (ref 38–126)
Anion gap: 14 (ref 5–15)
BUN: 7 mg/dL — ABNORMAL LOW (ref 8–23)
CO2: 26 mmol/L (ref 22–32)
Calcium: 9.4 mg/dL (ref 8.9–10.3)
Chloride: 98 mmol/L (ref 98–111)
Creatinine, Ser: 0.86 mg/dL (ref 0.44–1.00)
GFR calc Af Amer: >60 mL/min (ref >60)
Glucose, Bld: 78 mg/dL (ref 70–99)
Potassium: 3.4 mmol/L — ABNORMAL LOW (ref 3.5–5.1)
Sodium: 138 mmol/L (ref 135–145)
Total Bilirubin: 2.1 mg/dL — ABNORMAL HIGH (ref 0.3–1.2)
Total Protein: 8.3 g/dL — ABNORMAL HIGH (ref 6.5–8.1)

## 2018-11-15 LAB — CBC WITH DIFFERENTIAL/PLATELET
Abs Immature Granulocytes: 0.02 10*3/uL (ref 0.00–0.07)
Basophils Absolute: 0 10*3/uL (ref 0.0–0.1)
Basophils Relative: 0 %
Eosinophils Absolute: 0.1 10*3/uL (ref 0.0–0.5)
Eosinophils Relative: 2 %
HCT: 45.7 % (ref 36.0–46.0)
Hemoglobin: 15.4 g/dL — ABNORMAL HIGH (ref 12.0–15.0)
Immature Granulocytes: 0 %
LYMPHS PCT: 13 %
Lymphs Abs: 0.7 10*3/uL (ref 0.7–4.0)
MCH: 30.9 pg (ref 26.0–34.0)
MCHC: 33.7 g/dL (ref 30.0–36.0)
MCV: 91.6 fL (ref 80.0–100.0)
Monocytes Absolute: 0.4 10*3/uL (ref 0.1–1.0)
Monocytes Relative: 8 %
Neutro Abs: 4.1 10*3/uL (ref 1.7–7.7)
Neutrophils Relative %: 77 %
Platelets: 153 10*3/uL (ref 150–400)
RBC: 4.99 MIL/uL (ref 3.87–5.11)
RDW: 13.6 % (ref 11.5–15.5)
WBC: 5.4 10*3/uL (ref 4.0–10.5)
nRBC: 0 % (ref 0.0–0.2)

## 2018-11-15 NOTE — Progress Notes (Signed)
[employer] OFFICE PROGRESS NOTE  Patient Care Team: Steele Sizer, MD as PCP - General (Family Medicine) Wellington Hampshire, MD as PCP - Cardiology (Cardiology) Bary Castilla, Forest Gleason, MD (General Surgery) [name], MD (Inactive) as Attending Physician (Internal Medicine)  Cancer Staging No matching staging information was found for the patient.   Oncology History   # LEFT BREAST IDC; STAGE II [cT2N1] ER >90%; PR- 50-90%; her 2 NEU- POS; s/p Neoadj chemo; AUG 2016- TCH+P s/p Lumpec & partial ALND- path CR; s/p RT [finished Nov 2016];  adj Herceptin; HELD for Jan 19th 2017 [in DEC 2016-EF dropped from 63 to 51%; FEB 27th EF-42.9%]; JAN 2016 START Arimidex; May 2017- EF- 60%; May 24th 2017-Re-start Herceptin q 3W; July 28th STOP herceptin [finished 74m/m2; sec to Low EF; however 2017 Oct EF= 67%; improved]  # DEC 4th 2017- Start Neratinib 4 pills; DEC 11th 3 pills; STOPPED.  # FEB 2018- RECURRENCE ER/PR positive; Her 2 NEGATIVE [liver Bx]  # MARCH 1st 2018- Tax-Cytoxan [poor tol]  # FEB 2018- Brain mets [SBRT; Dr.Crystal; finished Feb 28th 2018]  # March 2018- Eribulin- poor tolerance  # June 8th 2018- Started Faslodex + Abema [abema-multiple interruptions sec to neutropenia]  # MRI Brain- March 2019- Leptomeningeal mets [WBRT; No LP s/p WBRT- 01/09/2018]; STOP Abema [528mday-sec to severe cytopenia]+faslodex  # MAY 20tth 019- Start Xeloda; STOPPED in Nov 22nd 2019- sec to Progressive liver lesions on CT scan  # NOV 26th 2019- Faslodex + Piqaray 25071m; Jan 2020- Piqray 300 mg+ faslodex; FEB 17th CT scan- mixed response/ Overall progression; STOP Piqray + faslodex.   # FEB 25th 2020- ERIBULIN.   # PN- G-2; may 2017- Cymbalta 62m69mMARCH 2018-  acute vascular insuff of BIL LE [? Taxotere vasoconstriction on asprin]; # hemorragic shock LGIB [s/p colo; Dr.Wohl]  #  SVT- on flecainide.   # April 2016- Liver Bx- NEG;   # Drop in EF from Herceptin [recovered  OCt 27th- EF 65%]  # BMD- jan 2017- osteopenia  -----------------------------------------------------------     [place]- Sep 4th 2018- PDL-1 0%/NEG for BRCA; MSI-Stable; Positive for PI3K; ccnd-1; FGF** -----------------------------------------------------------  Dx: Metastatic Breast cancer [ER/PR-Pos; her -2 NEG] Stage IV; goals: palliative Current treatment- ERIBULIN.      Carcinoma of upper-outer quadrant of left breast in female, estrogen receptor positive (HCC)[place] -  Chemotherapy    The patient had eriBULin mesylate (HALAVEN) 2.1 mg in sodium chloride 0.9 % 100 mL chemo infusion, 1.2 mg/m2 = 2.1 mg (100 % of original dose 1.2 mg/m2), Intravenous,  Once, 0 of 4 cycles Dose modification: 1.2 mg/m2 (original dose 1.2 mg/m2, Cycle 1, Reason: Provider Judgment)  for chemotherapy treatment.      INTERVAL HISTORY:  Sarah [id number]  female pleasant patient above history of metastatic breast cancer ER PR positive/leptomeningeal disease metastatic to braiDunlapently started on Faslodex plus Piqray is here for follow-up.  Patient complains of mild soreness in the mouth.  Denies any significant nausea vomiting.  Denies any new onset of headaches.  1-2 loose stools a day.  She is noting.  No fevers or chills.  Mild fatigue not any worse.  Review of Systems  Constitutional: Positive for malaise/fatigue. Negative for chills, diaphoresis, fever and weight loss.  HENT: Negative for nosebleeds and sore throat.   Eyes: Negative for double vision.  Respiratory: Negative for cough, hemoptysis, sputum production, shortness of breath and wheezing.   Cardiovascular: Negative  for chest pain, palpitations, orthopnea and leg swelling.  Gastrointestinal: Negative for abdominal pain, blood in stool, constipation, heartburn, melena, nausea and vomiting.  Genitourinary: Negative for dysuria, frequency and urgency.  Musculoskeletal: Positive for joint pain.  Negative for back pain.  Skin: Negative for itching.  Neurological: Positive for tingling. Negative for dizziness, focal weakness, weakness and headaches.  Endo/Heme/Allergies: Does not bruise/bleed easily.  Psychiatric/Behavioral: Negative for depression. The patient is not nervous/anxious and does not have insomnia.       PAST MEDICAL HISTORY :  Past Medical History:  Diagnosis Date  . Chemotherapy-induced peripheral neuropathy ([place])   . Epistaxis    a. 11/2016 in setting of asa/plavix-->silver nitrate cauterization.  . GI bleed    a. 11/2016 Admission w/ GIB and hypovolemic shock req 3u PRBC's;  b. 11/2016 ECG: gastritis & nonbleeding peptic ulcer; c. 11/2016 Conlonoscopy: rectal and sigmoid colonic ulcers.  . Heart attack ([place])    a. 1998 Cath @ [facility]: reportedly no intervention required.  Marland Kitchen Herceptin-induced cardiomyopathy ([place])    a. In the setting of Herceptin Rx for breast cancer (initiated 12/2014); b. 03/2015 MUGA EF 64%; b. 08/2015 MUGA: EF 51%; c. 10/2015 MUGA: EF 44%; d. 11/2015 Echo: EF 45-50%; e. 01/2016 MUGA: EF 60%; f. 06/2016 MUGA EF 65%; g. 10/2016 MUGA: EF 61%;  h. 12/2016 Echo: EF 55-60%, gr1 DD.  Marland Kitchen Hyperlipidemia   . Hypertension   . Neuropathy   . Possible PAD (peripheral artery disease) ([place])    a. 11/2016 LE cyanosis and weak pulses-->CTA w/o significant Ao-BiFem dzs. ? distal dzs-->ASA/Plavix initiated by vascular surgery.  Marland Kitchen PSVT (paroxysmal supraventricular tachycardia) ([place])    a. Dx 11/2016.  . Pulmonary embolism ([place])    a. 12/2016 CTA Chest: small nonocclusive PE in inferior segment of the Left lingula, somewhat eccentric filling defect suggesting chronic rather than acute embolic event; b. 01/5731 LE U/S:  No DVT; c. 12/2016 Echo: Nl RV fxn, nl PASP.  Marland Kitchen Recurrent Metastatic breast cancer ([place])    a. Dx 2016: Stage II, ER positive, PR positive, HER-2/neu overexpressing of the left breast-->chemo/radiation; b. 10/2016 CT Abd/pelvis: diffuse liver mets, ill defined sclerotic  bone lesions-T12;  c. 10/2016 MRI brain: metastatic lesion along L temporal lobe (19x63m) w/ extensive surrounding edema & 573mmidline shift to right.  . Sinus tachycardia     PAST SURGICAL HISTORY :   Past Surgical History:  Procedure Laterality Date  . BREAST BIOPSY Left 2016   Positive  . BREAST LUMPECTOMY WITH SENTINEL LYMPH NODE BIOPSY Left 05/23/2015   Procedure: LEFT BREAST WIDE EXCISION WITH AXILLARY DISSECTION, MASTOPLASTY ;  Surgeon: JeRobert BellowMD;  Location: [facility] ORS;  Service: General;  Laterality: Left;  . BREAST SURGERY Left 12/18/14   breast biopsy/INVASIVE DUCTAL CARCINOMA OF BREAST, NOTTINGHAM GRADE 2.  . Marland KitchenREAST SURGERY  05/23/2015.   Wide excision/mastoplasty, axillary dissection. No residual invasive cancer, positive for residual DCIS. 0/2 nodes identified on axillary dissection. (no SLN by technetium or methylene blue)  . CARDIAC CATHETERIZATION    . COLONOSCOPY WITH PROPOFOL N/A 12/10/2016   Procedure: COLONOSCOPY WITH PROPOFOL;  Surgeon: DaLucilla LameMD;  Location: [facility] ENDOSCOPY;  Service: Endoscopy;  Laterality: N/A;  . COLONOSCOPY WITH PROPOFOL N/A 02/13/2017   Procedure: COLONOSCOPY WITH PROPOFOL;  Surgeon: WoLucilla LameMD;  [facility];  Service: Endoscopy;  Laterality: N/A;  . ESOPHAGOGASTRODUODENOSCOPY (EGD) WITH PROPOFOL N/A 12/08/2016   Procedure: ESOPHAGOGASTRODUODENOSCOPY (EGD) WITH PROPOFOL;  Surgeon: DaLucilla LameMD;  [facility];  Service: Endoscopy;  Laterality: N/A;  . IVC FILTER INSERTION N/A 02/15/2017   Procedure: IVC Filter Insertion;  Surgeon: Algernon Huxley, MD;  Location: [place] LAB;  Service: Cardiovascular;  Laterality: N/A;  . PORTACATH PLACEMENT Right 12-31-14   Dr Bary Castilla    FAMILY HISTORY :   Family History  Problem Relation Age of Onset  . Breast cancer Maternal Aunt   . Breast cancer Cousin   . Brain cancer Maternal Uncle   . Diabetes Mother   . Hypertension Mother   . Stroke Mother      SOCIAL HISTORY:   Social History   Tobacco Use  . Smoking status: Light Tobacco Smoker    Packs/day: 0.50    Years: 18.00    Pack years: 9.00    Types: Cigarettes  . Smokeless tobacco: Never Used  Substance Use Topics  . Alcohol use: No    Alcohol/week: 0.0 standard drinks  . Drug use: No    ALLERGIES:  is allergic to no known allergies.  MEDICATIONS:  Current Outpatient Medications  Medication Sig Dispense Refill  . alpelisib (PIQRAY 300MG DAILY DOSE) 2 x 150 MG Therapy Pack Take 2 tablets (354m total) by mouth once daily with food. 60 each 4  . flecainide (TAMBOCOR) 50 MG tablet Take 1 tablet (50 mg total) by mouth 2 (two) times daily. 180 tablet 3  . levocetirizine (XYZAL) 5 MG tablet Take 1 tablet (5 mg total) by mouth every evening. 30 tablet 3  . magnesium oxide (MAG-OX) 400 MG tablet TAKE 1 TABLET (400 MG TOTAL) BY MOUTH 2 (TWO) TIMES DAILY. 60 tablet 6  . metFORMIN (GLUCOPHAGE) 500 MG tablet Take 1 tablet (500 mg total) by mouth 2 (two) times daily with a meal. 60 tablet 4  . pregabalin (LYRICA) 75 MG capsule Take 1 capsule (75 mg total) by mouth 2 (two) times daily. 180 capsule 0  . triamcinolone ointment (KENALOG) 0.5 % Apply 1 application topically 2 (two) times daily. 30 g 2  . Vitamin D, Ergocalciferol, (DRISDOL) 50000 units CAPS capsule TAKE 1 CAPSULE (50,000 UNITS TOTAL) ONCE A WEEK BY MOUTH. 12 capsule 1   No current facility-administered medications for this visit.    Facility-Administered Medications Ordered in Other Visits  Medication Dose Route Frequency Provider Last Rate Last Dose  . 0.9 %  sodium chloride infusion   Intravenous Continuous Pandit, Sandeep, MD      . 0.9 %  sodium chloride infusion   Intravenous Continuous FLloyd Huger MD 999 mL/hr at 04/24/15 1520    . heparin lock flush 100 unit/mL  500 Units Intravenous Once Berenzon, Dmitriy, MD      . sodium chloride 0.9 % injection 10 mL  10 mL Intravenous PRN PLeia Alf MD   10 mL  at 04/04/15 1440  . sodium chloride flush (NS) 0.9 % injection 10 mL  10 mL Intravenous PRN Berenzon, Dmitriy, MD      . Tbo-Filgrastim (GRANIX) injection 480 mcg  480 mcg Subcutaneous Once BCammie Sickle MD        PHYSICAL EXAMINATION: ECOG PERFORMANCE STATUS: 1 - Symptomatic but completely ambulatory  BP 126/78 (BP Location: Left Arm, Patient Position: Sitting, Cuff Size: Normal)   Pulse (!) 118   Temp (!) 96.9 F (36.1 C) (Tympanic)   Resp 16   Wt 148 lb (67.1 kg)   BMI 23.89 kg/m   Filed Weights   11/15/18 1032  Weight: 148 lb (67.1 kg)  Physical Exam  Constitutional: She is oriented to person, place, and time and well-developed, well-nourished, and in no distress.  Alone.  Walking herself.  HENT:  Head: Normocephalic and atraumatic.  Mouth/Throat: Oropharynx is clear and moist. No oropharyngeal exudate.  Eyes: Pupils are equal, round, and reactive to light.  Neck: Normal range of motion. Neck supple.  Cardiovascular: Normal rate and regular rhythm.  Pulmonary/Chest: No respiratory distress. She has no wheezes.  Abdominal: Soft. Bowel sounds are normal. She exhibits no distension and no mass. There is no abdominal tenderness. There is no rebound and no guarding.  Musculoskeletal: Normal range of motion.        General: No tenderness or edema.  Neurological: She is alert and oriented to person, place, and time.  Skin: Skin is warm.  hyperpigmentation of the bilateral hand and feet (better)  Psychiatric: Affect normal.       LABORATORY DATA:  I have reviewed the data as listed    Component Value Date/Time   NA 138 11/15/2018 0948   NA 135 03/04/2017 0918   NA 135 01/08/2015 0850   K 3.4 (L) 11/15/2018 0948   K 3.7 01/08/2015 0850   CL 98 11/15/2018 0948   CL 101 01/08/2015 0850   CO2 26 11/15/2018 0948   CO2 26 01/08/2015 0850   GLUCOSE 78 11/15/2018 0948   GLUCOSE 161 (H) 01/08/2015 0850   BUN 7 (L) 11/15/2018 0948   BUN 8 03/04/2017 0918    BUN 17 01/08/2015 0850   CREATININE 0.86 11/15/2018 0948   CREATININE 0.82 01/22/2015 1559   CALCIUM 9.4 11/15/2018 0948   CALCIUM 9.3 01/08/2015 0850   PROT 8.3 (H) 11/15/2018 0948   PROT 6.4 03/04/2017 0918   PROT 6.8 01/22/2015 1559   ALBUMIN 4.5 11/15/2018 0948   ALBUMIN 2.5 (L) 03/04/2017 0918   ALBUMIN 3.9 01/22/2015 1559   AST 40 11/15/2018 0948   AST 34 01/22/2015 1559   ALT 29 11/15/2018 0948   ALT 42 01/22/2015 1559   ALKPHOS 141 (H) 11/15/2018 0948   ALKPHOS 157 (H) 01/22/2015 1559   BILITOT 2.1 (H) 11/15/2018 0948   BILITOT 6.2 (H) 03/04/2017 0918   BILITOT 0.2 (L) 01/22/2015 1559   GFRNONAA >60 11/15/2018 0948   GFRNONAA >60 01/22/2015 1559   GFRAA >60 11/15/2018 0948   GFRAA >60 01/22/2015 1559    No results found for: SPEP, UPEP  Lab Results  Component Value Date   WBC 5.4 11/15/2018   NEUTROABS 4.1 11/15/2018   HGB 15.4 (H) 11/15/2018   HCT 45.7 11/15/2018   MCV 91.6 11/15/2018   PLT 153 11/15/2018      Chemistry      Component Value Date/Time   NA 138 11/15/2018 0948   NA 135 03/04/2017 0918   NA 135 01/08/2015 0850   K 3.4 (L) 11/15/2018 0948   K 3.7 01/08/2015 0850   CL 98 11/15/2018 0948   CL 101 01/08/2015 0850   CO2 26 11/15/2018 0948   CO2 26 01/08/2015 0850   BUN 7 (L) 11/15/2018 0948   BUN 8 03/04/2017 0918   BUN 17 01/08/2015 0850   CREATININE 0.86 11/15/2018 0948   CREATININE 0.82 01/22/2015 1559      Component Value Date/Time   CALCIUM 9.4 11/15/2018 0948   CALCIUM 9.3 01/08/2015 0850   ALKPHOS 141 (H) 11/15/2018 0948   ALKPHOS 157 (H) 01/22/2015 1559   AST 40 11/15/2018 0948   AST 34 01/22/2015 1559   ALT  29 11/15/2018 0948   ALT 42 01/22/2015 1559   BILITOT 2.1 (H) 11/15/2018 0948   BILITOT 6.2 (H) 03/04/2017 0918   BILITOT 0.2 (L) 01/22/2015 1559       RADIOGRAPHIC STUDIES: I have personally reviewed the radiological images as listed and agreed with the findings in the report. Ct Abdomen Pelvis W  Contrast  Result Date: 11/14/2018 CLINICAL DATA:  Stage IV left breast cancer with liver metastases, presenting for restaging after chemotherapy. EXAM: CT ABDOMEN AND PELVIS WITH CONTRAST TECHNIQUE: Multidetector CT imaging of the abdomen and pelvis was performed using the standard protocol following bolus administration of intravenous contrast. CONTRAST:  87m ISOVUE-300 IOPAMIDOL (ISOVUE-300) INJECTION 61% COMPARISON:  08/11/2018 CT abdomen/pelvis. FINDINGS: Lower chest: Scattered small solid pulmonary nodules at the left lung base, largest 3 mm (series 3/image 9), not appreciably changed. New 2 mm solid basilar right lower lobe pulmonary nodule (series 3/image 9). Hepatobiliary: Stable irregular liver contour. Several (at least 8) heterogeneously hypoenhancing liver masses scattered throughout the liver, with mild mixed interval changes, with representative lesions as follows: -right liver dome 1.6 x 1.2 cm mass (series 2/image 10), slightly increased from 1.5 x 1.12 cm -posterior right liver lobe 2.4 x 1.7 cm mass (series 2/image 20), mildly increased from 1.6 x 1.5 cm -left liver lobe 2.5 x 1.3 cm mass (series 2/image 24), previously 2.7 x 1.2 cm, not appreciably changed -inferior right liver lobe 2.0 x 1.5 cm mass (series 2/image 28, previously 2.0 x 1.4 cm, not appreciably changed -peripheral right liver lobe 2.8 x 1.9 cm mass (series 2/image 21), previously 3.2 x 2.0 cm, mildly decreased Normal gallbladder with no radiopaque cholelithiasis. No biliary ductal dilatation. Pancreas: Normal, with no mass or duct dilation. Spleen: Normal size. No mass. Adrenals/Urinary Tract: No discrete adrenal masses. No hydronephrosis. No renal masses. Normal bladder. Stomach/Bowel: Normal non-distended stomach. Normal caliber small bowel with no small bowel wall thickening. Appendix not discretely visualized. Normal large bowel with no diverticulosis, large bowel wall thickening or pericolonic fat stranding.  Vascular/Lymphatic: Atherosclerotic nonaneurysmal abdominal aorta. IVC filter is in place below the level of the renal veins. Patent portal, splenic, hepatic and renal veins. No pathologically enlarged lymph nodes in the abdomen or pelvis. Reproductive: Stable mildly enlarged myomatous uterus with coarsely calcified uterine fibroids. No adnexal masses. Other: No pneumoperitoneum, ascites or focal fluid collection. Musculoskeletal: Increased sclerosis within mixed lytic and sclerotic mildly expansile osseous lesions in medial superior and inferior right pubic rami and right pubic symphysis. No new focal osseous lesions. IMPRESSION: 1. Mild mixed interval changes in the liver metastases as detailed. 2. Increased sclerosis within the mixed lytic and sclerotic right superior and inferior pubic rami osseous metastases, which is nonspecific and could represent treatment effect. 3. New tiny solid pulmonary nodule at the right lung base. Tiny pulmonary nodules at the left lung base are stable. 4.  Aortic Atherosclerosis (ICD10-I70.0). Electronically Signed   By: JIlona SorrelM.D.   On: 11/14/2018 11:14     ASSESSMENT & PLAN:  Carcinoma of upper-outer quadrant of left breast in female, estrogen receptor positive ([place] # Recurrent metastatic breast cancer-ER PR positive HER-2 negative; November 14 CT scan shows progressive liver lesions.  Currently on alpelisib  [starting 11/27]+ faslodex; FEB 17th CT- mixed response in liver [few lesion smaller few larger].  #Given the mixed response at best/elevated blood sugars [390s-peak/on metformin 500 twice daily]-I would recommend discontinuation of Piqray.  Patient obviously disappointed.   # recommend eribulin-again reminded the patient of  potential side effects including but not limited to neuropathy/dropping blood counts risk of infections etc.  Discussed the schedule.  Recommend starting at 1.82m/m dose.  # After lengthy discussion weighing risk versus benefits  patient agrees to proceed with treatment as discussed above.  Patient again understands treatments are palliative not curative.   #Blood sugar 78;  fasting-secondary to piqray; however as high as 396 at home; stop metformin as piqray will be discontinued.   #Elevated alkaline phosphatase 140.  Malignancy versus piqray. Stable.    # Brain metastases-recurrent/likely leptomeningeal disease;  S/p WBRT [April 2019]. NOv 19th MRI stable-no progressive leptomeningeal disease. Stable. Will repeat in April 2020.   #Peripheral neuropathy grade 1-  on Lyrica; stable.   # increase lacrimation- ?  Nasolacrimal duct occlusion versus allergies.  Recommend antihistamines eyedrops if not improved recommend evaluation with ophthalmology.  # DISPOSITION: # HOLD off Faslodex today;  # 1 week- eribulin/ no labs # 2 week- eribulin-MD/ cbc/cmp Dr.B  # 40 minutes face-to-face with the patient discussing the above plan of care; more than 50% of time spent on prognosis/ natural history; counseling and coordination.     Orders Placed This Encounter  Procedures  . CBC with Differential    Standing Status:   Future    Standing Expiration Date:   11/16/2019  . Comprehensive metabolic panel    Standing Status:   Future    Standing Expiration Date:   11/16/2019   All questions were answered. The patient knows to call the clinic with any problems, questions or concerns.      GCammie Sickle MD 11/15/2018 12:54 PM

## 2018-11-15 NOTE — Progress Notes (Signed)
OFF PATHWAY REGIMEN - Breast  No Change  Continue With Treatment as Ordered.   [id number]:Eribulin 1.4 mg/m2 D1, 8 q21 Days:   A cycle is every 21 days:     Eribulin mesylate   **Always confirm dose/schedule in your pharmacy ordering system**  Patient Characteristics: Metastatic Chemotherapy, HER2/neu Negative/Unknown/Equivocal, ER +, Second Line, Prior Paclitaxel AJCC Stage Grouping: IV Current Disease Status: Distant Metastases AJCC M Stage: X ER Status: Positive (+) AJCC N Stage: X AJCC T Stage: X HER2/neu: Negative (-) PR Status: Positive (+) Line of therapy: Second Line  Intent of Therapy: Non-Curative / Palliative Intent, Discussed with Patient

## 2018-11-15 NOTE — Assessment & Plan Note (Addendum)
#  Recurrent metastatic breast cancer-ER PR positive HER-2 negative; November 14 CT scan shows progressive liver lesions.  Currently on alpelisib  [starting 11/27]+ faslodex; FEB 17th CT- mixed response in liver [few lesion smaller few larger].  #Given the mixed response at best/elevated blood sugars [390s-peak/on metformin 500 twice daily]-I would recommend discontinuation of Piqray.  Patient obviously disappointed.   # recommend eribulin-again reminded the patient of potential side effects including but not limited to neuropathy/dropping blood counts risk of infections etc.  Discussed the schedule.  Recommend starting at 1.28m/m dose.  # After lengthy discussion weighing risk versus benefits patient agrees to proceed with treatment as discussed above.  Patient again understands treatments are palliative not curative.   #Blood sugar 78;  fasting-secondary to piqray; however as high as 396 at home; stop metformin as piqray will be discontinued.   #Elevated alkaline phosphatase 140.  Malignancy versus piqray. Stable.    # Brain metastases-recurrent/likely leptomeningeal disease;  S/p WBRT [April 2019]. NOv 19th MRI stable-no progressive leptomeningeal disease. Stable. Will repeat in April 2020.   #Peripheral neuropathy grade 1-  on Lyrica; stable.   # increase lacrimation- ?  Nasolacrimal duct occlusion versus allergies.  Recommend antihistamines eyedrops if not improved recommend evaluation with ophthalmology.  # DISPOSITION: # HOLD off Faslodex today;  # 1 week- eribulin/ no labs # 2 week- eribulin-MD/ cbc/cmp Dr.B  # 40 minutes face-to-face with the patient discussing the above plan of care; more than 50% of time spent on prognosis/ natural history; counseling and coordination.

## 2018-11-15 NOTE — Progress Notes (Signed)
Patient complains of having N/V, diarrhea since increasing the metformin dose to 500mg . Patient states she has no appetite, and has had a 6 lb weight loss since last visit with [place] on 10/31/18.

## 2018-11-15 NOTE — Patient Instructions (Addendum)
#   STOP METFORMIN # STOP PIQRAY # STOP BLOOD GLUCOSE CHECKS # ZADITOR eye drop twice a day/ Over the counter.

## 2018-11-22 ENCOUNTER — Inpatient Hospital Stay: Payer: BLUE CROSS/BLUE SHIELD

## 2018-11-22 VITALS — BP 124/81 | HR 100 | Temp 97.8°F | Resp 20 | Wt 157.0 lb

## 2018-11-22 DIAGNOSIS — I252 Old myocardial infarction: Secondary | ICD-10-CM | POA: Diagnosis not present

## 2018-11-22 DIAGNOSIS — R739 Hyperglycemia, unspecified: Secondary | ICD-10-CM | POA: Diagnosis not present

## 2018-11-22 DIAGNOSIS — C7951 Secondary malignant neoplasm of bone: Secondary | ICD-10-CM | POA: Diagnosis not present

## 2018-11-22 DIAGNOSIS — I10 Essential (primary) hypertension: Secondary | ICD-10-CM | POA: Diagnosis not present

## 2018-11-22 DIAGNOSIS — M8580 Other specified disorders of bone density and structure, unspecified site: Secondary | ICD-10-CM | POA: Diagnosis not present

## 2018-11-22 DIAGNOSIS — G629 Polyneuropathy, unspecified: Secondary | ICD-10-CM | POA: Diagnosis not present

## 2018-11-22 DIAGNOSIS — Z5111 Encounter for antineoplastic chemotherapy: Secondary | ICD-10-CM | POA: Diagnosis not present

## 2018-11-22 DIAGNOSIS — Z7189 Other specified counseling: Secondary | ICD-10-CM

## 2018-11-22 DIAGNOSIS — C50412 Malignant neoplasm of upper-outer quadrant of left female breast: Secondary | ICD-10-CM

## 2018-11-22 DIAGNOSIS — C787 Secondary malignant neoplasm of liver and intrahepatic bile duct: Secondary | ICD-10-CM | POA: Diagnosis not present

## 2018-11-22 DIAGNOSIS — Z170 Estrogen receptor positive status [ER+]: Secondary | ICD-10-CM | POA: Diagnosis not present

## 2018-11-22 DIAGNOSIS — C7931 Secondary malignant neoplasm of brain: Secondary | ICD-10-CM | POA: Diagnosis not present

## 2018-11-22 DIAGNOSIS — I471 Supraventricular tachycardia: Secondary | ICD-10-CM | POA: Diagnosis not present

## 2018-11-22 DIAGNOSIS — K1230 Oral mucositis (ulcerative), unspecified: Secondary | ICD-10-CM | POA: Diagnosis not present

## 2018-11-22 DIAGNOSIS — R748 Abnormal levels of other serum enzymes: Secondary | ICD-10-CM | POA: Diagnosis not present

## 2018-11-22 DIAGNOSIS — F17210 Nicotine dependence, cigarettes, uncomplicated: Secondary | ICD-10-CM | POA: Diagnosis not present

## 2018-11-22 DIAGNOSIS — C7949 Secondary malignant neoplasm of other parts of nervous system: Secondary | ICD-10-CM | POA: Diagnosis not present

## 2018-11-22 MED ORDER — PROCHLORPERAZINE MALEATE 10 MG PO TABS
10.0000 mg | ORAL_TABLET | Freq: Once | ORAL | Status: AC
  Administered 2018-11-22: 10 mg via ORAL
  Filled 2018-11-22: qty 1

## 2018-11-22 MED ORDER — SODIUM CHLORIDE 0.9 % IV SOLN
Freq: Once | INTRAVENOUS | Status: AC
  Administered 2018-11-22: 09:00:00 via INTRAVENOUS
  Filled 2018-11-22: qty 250

## 2018-11-22 MED ORDER — SODIUM CHLORIDE 0.9% FLUSH
10.0000 mL | INTRAVENOUS | Status: DC | PRN
  Administered 2018-11-22: 10 mL
  Filled 2018-11-22: qty 10

## 2018-11-22 MED ORDER — SODIUM CHLORIDE 0.9 % IV SOLN
1.2000 mg/m2 | Freq: Once | INTRAVENOUS | Status: AC
  Administered 2018-11-22: 2.1 mg via INTRAVENOUS
  Filled 2018-11-22: qty 4.2

## 2018-11-22 MED ORDER — HEPARIN SOD (PORK) LOCK FLUSH 100 UNIT/ML IV SOLN
500.0000 [IU] | Freq: Once | INTRAVENOUS | Status: AC | PRN
  Administered 2018-11-22: 500 [IU]
  Filled 2018-11-22: qty 5

## 2018-11-23 DIAGNOSIS — H5213 Myopia, bilateral: Secondary | ICD-10-CM | POA: Diagnosis not present

## 2018-11-29 ENCOUNTER — Encounter: Payer: Self-pay | Admitting: Internal Medicine

## 2018-11-29 ENCOUNTER — Inpatient Hospital Stay: Payer: BLUE CROSS/BLUE SHIELD

## 2018-11-29 ENCOUNTER — Inpatient Hospital Stay (HOSPITAL_BASED_OUTPATIENT_CLINIC_OR_DEPARTMENT_OTHER): Payer: BLUE CROSS/BLUE SHIELD | Admitting: Internal Medicine

## 2018-11-29 ENCOUNTER — Inpatient Hospital Stay: Payer: BLUE CROSS/BLUE SHIELD | Attending: Internal Medicine

## 2018-11-29 VITALS — BP 126/84 | HR 110 | Temp 98.2°F | Resp 16 | Wt 155.0 lb

## 2018-11-29 DIAGNOSIS — F17210 Nicotine dependence, cigarettes, uncomplicated: Secondary | ICD-10-CM | POA: Diagnosis not present

## 2018-11-29 DIAGNOSIS — I471 Supraventricular tachycardia: Secondary | ICD-10-CM | POA: Diagnosis not present

## 2018-11-29 DIAGNOSIS — R5383 Other fatigue: Secondary | ICD-10-CM | POA: Insufficient documentation

## 2018-11-29 DIAGNOSIS — C7931 Secondary malignant neoplasm of brain: Secondary | ICD-10-CM | POA: Diagnosis not present

## 2018-11-29 DIAGNOSIS — Z86711 Personal history of pulmonary embolism: Secondary | ICD-10-CM | POA: Insufficient documentation

## 2018-11-29 DIAGNOSIS — G620 Drug-induced polyneuropathy: Secondary | ICD-10-CM | POA: Insufficient documentation

## 2018-11-29 DIAGNOSIS — I429 Cardiomyopathy, unspecified: Secondary | ICD-10-CM | POA: Diagnosis not present

## 2018-11-29 DIAGNOSIS — I252 Old myocardial infarction: Secondary | ICD-10-CM | POA: Insufficient documentation

## 2018-11-29 DIAGNOSIS — Z170 Estrogen receptor positive status [ER+]: Secondary | ICD-10-CM | POA: Insufficient documentation

## 2018-11-29 DIAGNOSIS — R5381 Other malaise: Secondary | ICD-10-CM | POA: Insufficient documentation

## 2018-11-29 DIAGNOSIS — M8580 Other specified disorders of bone density and structure, unspecified site: Secondary | ICD-10-CM | POA: Diagnosis not present

## 2018-11-29 DIAGNOSIS — T451X5S Adverse effect of antineoplastic and immunosuppressive drugs, sequela: Secondary | ICD-10-CM | POA: Diagnosis not present

## 2018-11-29 DIAGNOSIS — D701 Agranulocytosis secondary to cancer chemotherapy: Secondary | ICD-10-CM | POA: Insufficient documentation

## 2018-11-29 DIAGNOSIS — D709 Neutropenia, unspecified: Secondary | ICD-10-CM | POA: Diagnosis not present

## 2018-11-29 DIAGNOSIS — Z7689 Persons encountering health services in other specified circumstances: Secondary | ICD-10-CM | POA: Insufficient documentation

## 2018-11-29 DIAGNOSIS — E785 Hyperlipidemia, unspecified: Secondary | ICD-10-CM | POA: Insufficient documentation

## 2018-11-29 DIAGNOSIS — Z79899 Other long term (current) drug therapy: Secondary | ICD-10-CM | POA: Diagnosis not present

## 2018-11-29 DIAGNOSIS — C7949 Secondary malignant neoplasm of other parts of nervous system: Secondary | ICD-10-CM

## 2018-11-29 DIAGNOSIS — C50412 Malignant neoplasm of upper-outer quadrant of left female breast: Secondary | ICD-10-CM | POA: Diagnosis not present

## 2018-11-29 DIAGNOSIS — Z7902 Long term (current) use of antithrombotics/antiplatelets: Secondary | ICD-10-CM | POA: Diagnosis not present

## 2018-11-29 DIAGNOSIS — R748 Abnormal levels of other serum enzymes: Secondary | ICD-10-CM | POA: Diagnosis not present

## 2018-11-29 DIAGNOSIS — Z7984 Long term (current) use of oral hypoglycemic drugs: Secondary | ICD-10-CM | POA: Diagnosis not present

## 2018-11-29 DIAGNOSIS — I10 Essential (primary) hypertension: Secondary | ICD-10-CM | POA: Diagnosis not present

## 2018-11-29 DIAGNOSIS — Z5111 Encounter for antineoplastic chemotherapy: Secondary | ICD-10-CM | POA: Diagnosis not present

## 2018-11-29 DIAGNOSIS — Z79818 Long term (current) use of other agents affecting estrogen receptors and estrogen levels: Secondary | ICD-10-CM | POA: Insufficient documentation

## 2018-11-29 DIAGNOSIS — Z7189 Other specified counseling: Secondary | ICD-10-CM

## 2018-11-29 DIAGNOSIS — G629 Polyneuropathy, unspecified: Secondary | ICD-10-CM | POA: Diagnosis not present

## 2018-11-29 LAB — COMPREHENSIVE METABOLIC PANEL
ALK PHOS: 212 U/L — AB (ref 38–126)
ALT: 44 U/L (ref 0–44)
AST: 65 U/L — ABNORMAL HIGH (ref 15–41)
Albumin: 3.4 g/dL — ABNORMAL LOW (ref 3.5–5.0)
Anion gap: 9 (ref 5–15)
BUN: 6 mg/dL — AB (ref 8–23)
CO2: 27 mmol/L (ref 22–32)
Calcium: 8.8 mg/dL — ABNORMAL LOW (ref 8.9–10.3)
Chloride: 103 mmol/L (ref 98–111)
Creatinine, Ser: 0.67 mg/dL (ref 0.44–1.00)
GFR calc Af Amer: >60 mL/min (ref >60)
GFR calc non Af Amer: >60 mL/min (ref >60)
Glucose, Bld: 90 mg/dL (ref 70–99)
Potassium: 3.8 mmol/L (ref 3.5–5.1)
Sodium: 139 mmol/L (ref 135–145)
Total Bilirubin: 0.9 mg/dL (ref 0.3–1.2)
Total Protein: 6.8 g/dL (ref 6.5–8.1)

## 2018-11-29 LAB — CBC WITH DIFFERENTIAL/PLATELET
Abs Immature Granulocytes: 0.12 10*3/uL — ABNORMAL HIGH (ref 0.00–0.07)
Basophils Absolute: 0.1 10*3/uL (ref 0.0–0.1)
Basophils Relative: 3 %
EOS ABS: 0 10*3/uL (ref 0.0–0.5)
Eosinophils Relative: 1 %
HCT: 36.8 % (ref 36.0–46.0)
Hemoglobin: 12.1 g/dL (ref 12.0–15.0)
Immature Granulocytes: 6 %
Lymphocytes Relative: 40 %
Lymphs Abs: 0.8 10*3/uL (ref 0.7–4.0)
MCH: 30.6 pg (ref 26.0–34.0)
MCHC: 32.9 g/dL (ref 30.0–36.0)
MCV: 93.2 fL (ref 80.0–100.0)
MONO ABS: 0.1 10*3/uL (ref 0.1–1.0)
Monocytes Relative: 7 %
Neutro Abs: 0.9 10*3/uL — ABNORMAL LOW (ref 1.7–7.7)
Neutrophils Relative %: 43 %
PLATELETS: 229 10*3/uL (ref 150–400)
RBC: 3.95 MIL/uL (ref 3.87–5.11)
RDW: 14 % (ref 11.5–15.5)
WBC: 2.1 10*3/uL — ABNORMAL LOW (ref 4.0–10.5)
nRBC: 0 % (ref 0.0–0.2)

## 2018-11-29 MED ORDER — HEPARIN SOD (PORK) LOCK FLUSH 100 UNIT/ML IV SOLN
500.0000 [IU] | Freq: Once | INTRAVENOUS | Status: AC | PRN
  Administered 2018-11-29: 500 [IU]
  Filled 2018-11-29: qty 5

## 2018-11-29 MED ORDER — SODIUM CHLORIDE 0.9 % IV SOLN
1.2000 mg/m2 | Freq: Once | INTRAVENOUS | Status: AC
  Administered 2018-11-29: 2.1 mg via INTRAVENOUS
  Filled 2018-11-29: qty 4.2

## 2018-11-29 MED ORDER — SODIUM CHLORIDE 0.9 % IV SOLN
Freq: Once | INTRAVENOUS | Status: AC
  Administered 2018-11-29: 10:00:00 via INTRAVENOUS
  Filled 2018-11-29: qty 250

## 2018-11-29 MED ORDER — PROCHLORPERAZINE MALEATE 10 MG PO TABS
10.0000 mg | ORAL_TABLET | Freq: Once | ORAL | Status: AC
  Administered 2018-11-29: 10 mg via ORAL
  Filled 2018-11-29: qty 1

## 2018-11-29 NOTE — Progress Notes (Signed)
[place] OFFICE PROGRESS NOTE  Patient Care Team: Steele Sizer, MD as PCP - General (Family Medicine) Wellington Hampshire, MD as PCP - Cardiology (Cardiology) Bary Castilla, Forest Gleason, MD (General Surgery) [name], MD (Inactive) as Attending Physician (Internal Medicine)  Cancer Staging No matching staging information was found for the patient.   Oncology History   # LEFT BREAST IDC; STAGE II [cT2N1] ER >90%; PR- 50-90%; her 2 NEU- POS; s/p Neoadj chemo; AUG 2016- TCH+P s/p Lumpec & partial ALND- path CR; s/p RT [finished Nov 2016];  adj Herceptin; HELD for Jan 19th 2017 [in DEC 2016-EF dropped from 63 to 51%; FEB 27th EF-42.9%]; JAN 2016 START Arimidex; May 2017- EF- 60%; May 24th 2017-Re-start Herceptin q 3W; July 28th STOP herceptin [finished 34m/m2; sec to Low EF; however 2017 Oct EF= 67%; improved]  # DEC 4th 2017- Start Neratinib 4 pills; DEC 11th 3 pills; STOPPED.  # FEB 2018- RECURRENCE ER/PR positive; Her 2 NEGATIVE [liver Bx]  # MARCH 1st 2018- Tax-Cytoxan [poor tol]  # FEB 2018- Brain mets [SBRT; Dr.Crystal; finished Feb 28th 2018]  # March 2018- Eribulin- poor tolerance  # June 8th 2018- Started Faslodex + Abema [abema-multiple interruptions sec to neutropenia]  # MRI Brain- March 2019- Leptomeningeal mets [WBRT; No LP s/p WBRT- 01/09/2018]; STOP Abema [522mday-sec to severe cytopenia]+faslodex  # MAY 20tth 019- Start Xeloda; STOPPED in Nov 22nd 2019- sec to Progressive liver lesions on CT scan  # NOV 26th 2019- Faslodex + Piqaray 25043m; Jan 2020- Piqray 300 mg+ faslodex; FEB 17th CT scan- mixed response/ Overall progression; STOP Piqray + faslodex.   # FEB 25th 2020- ERIBULIN.   # PN- G-2; may 2017- Cymbalta 14m63mMARCH 2018-  acute vascular insuff of BIL LE [? Taxotere vasoconstriction on asprin]; # hemorragic shock LGIB [s/p colo; Dr.Wohl]  #  SVT- on flecainide.   # April 2016- Liver Bx- NEG;   # Drop in EF from Herceptin [recovered  OCt 27th- EF 65%]  # BMD- jan 2017- osteopenia  -----------------------------------------------------------     [place] Sep 4th 2018- PDL-1 0%/NEG for BRCA; MSI-Stable; Positive for PI3K; ccnd-1; FGF** -----------------------------------------------------------  Dx: Metastatic Breast cancer [ER/PR-Pos; her -2 NEG] Stage IV; goals: palliative Current treatment- ERIBULIN.      Carcinoma of upper-outer quadrant of left breast in female, estrogen receptor positive (HCC)[place]2/25/2020 -  Chemotherapy    The patient had eriBULin mesylate ([place]) 2.1 mg in sodium chloride 0.9 % 100 mL chemo infusion, 1.2 mg/m2 = 2.1 mg (100 % of original dose 1.2 mg/m2), Intravenous,  Once, 1 of 4 cycles Dose modification: 1.2 mg/m2 (original dose 1.2 mg/m2, Cycle 1, Reason: Provider Judgment) Administration: 2.1 mg (11/22/2018)  for chemotherapy treatment.      INTERVAL HISTORY:  Sarah [id number].  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  Patient continues to complain of eye lacrimation.  Denies any sores in the mouth.  Continues to have mild tingling and numbness in extremities.  No diarrhea.  No headaches.  Mild to moderate fatigue.  No fevers or chills.  Review of Systems  Constitutional: Positive for malaise/fatigue. Negative for chills, diaphoresis, fever and weight loss.  HENT: Negative for nosebleeds and sore throat.   Eyes: Negative for double vision.  Respiratory: Negative for cough, hemoptysis, sputum production, shortness of breath and wheezing.   Cardiovascular: Negative for chest pain,  palpitations, orthopnea and leg swelling.  Gastrointestinal: Negative for abdominal pain, blood in stool, constipation, heartburn, melena, nausea and vomiting.  Genitourinary: Negative for dysuria, frequency and urgency.  Musculoskeletal: Positive for  joint pain. Negative for back pain.  Skin: Negative for itching.  Neurological: Positive for tingling. Negative for dizziness, focal weakness, weakness and headaches.  Endo/Heme/Allergies: Does not bruise/bleed easily.  Psychiatric/Behavioral: Negative for depression. The patient is not nervous/anxious and does not have insomnia.       PAST MEDICAL HISTORY :  Past Medical History:  Diagnosis Date  . Chemotherapy-induced peripheral neuropathy ([place])   . Epistaxis    a. 11/2016 in setting of asa/plavix-->silver nitrate cauterization.  . GI bleed    a. 11/2016 Admission w/ GIB and hypovolemic shock req 3u PRBC's;  b. 11/2016 ECG: gastritis & nonbleeding peptic ulcer; c. 11/2016 Conlonoscopy: rectal and sigmoid colonic ulcers.  . Heart attack ([place])    a. 1998 Cath @ [facility]: reportedly no intervention required.  Marland Kitchen Herceptin-induced cardiomyopathy ([place])    a. In the setting of Herceptin Rx for breast cancer (initiated 12/2014); b. 03/2015 MUGA EF 64%; b. 08/2015 MUGA: EF 51%; c. 10/2015 MUGA: EF 44%; d. 11/2015 Echo: EF 45-50%; e. 01/2016 MUGA: EF 60%; f. 06/2016 MUGA EF 65%; g. 10/2016 MUGA: EF 61%;  h. 12/2016 Echo: EF 55-60%, gr1 DD.  Marland Kitchen Hyperlipidemia   . Hypertension   . Neuropathy   . Possible PAD (peripheral artery disease) ([place])    a. 11/2016 LE cyanosis and weak pulses-->CTA w/o significant Ao-BiFem dzs. ? distal dzs-->ASA/Plavix initiated by vascular surgery.  Marland Kitchen PSVT (paroxysmal supraventricular tachycardia) ([place])    a. Dx 11/2016.  . Pulmonary embolism ([place])    a. 12/2016 CTA Chest: small nonocclusive PE in inferior segment of the Left lingula, somewhat eccentric filling defect suggesting chronic rather than acute embolic event; b. 09/1939 LE U/S:  No DVT; c. 12/2016 Echo: Nl RV fxn, nl PASP.  Marland Kitchen Recurrent Metastatic breast cancer ([place])    a. Dx 2016: Stage II, ER positive, PR positive, HER-2/neu overexpressing of the left breast-->chemo/radiation; b. 10/2016 CT Abd/pelvis: diffuse liver mets, ill  defined sclerotic bone lesions-T12;  c. 10/2016 MRI brain: metastatic lesion along L temporal lobe (19x98m) w/ extensive surrounding edema & 583mmidline shift to right.  . Sinus tachycardia     PAST SURGICAL HISTORY :   Past Surgical History:  Procedure Laterality Date  . BREAST BIOPSY Left 2016   Positive  . BREAST LUMPECTOMY WITH SENTINEL LYMPH NODE BIOPSY Left 05/23/2015   Procedure: LEFT BREAST WIDE EXCISION WITH AXILLARY DISSECTION, MASTOPLASTY ;  Surgeon: JeRobert BellowMD;  Location: [facility] ORS;  Service: General;  Laterality: Left;  . BREAST SURGERY Left 12/18/14   breast biopsy/INVASIVE DUCTAL CARCINOMA OF BREAST, NOTTINGHAM GRADE 2.  . Marland KitchenREAST SURGERY  05/23/2015.   Wide excision/mastoplasty, axillary dissection. No residual invasive cancer, positive for residual DCIS. 0/2 nodes identified on axillary dissection. (no SLN by technetium or methylene blue)  . CARDIAC CATHETERIZATION    . COLONOSCOPY WITH PROPOFOL N/A 12/10/2016   Procedure: COLONOSCOPY WITH PROPOFOL;  Surgeon: DaLucilla LameMD;  Location: [facility] ENDOSCOPY;  Service: Endoscopy;  Laterality: N/A;  . COLONOSCOPY WITH PROPOFOL N/A 02/13/2017   Procedure: COLONOSCOPY WITH PROPOFOL;  Surgeon: WoLucilla LameMD;  [facility];  Service: Endoscopy;  Laterality: N/A;  . ESOPHAGOGASTRODUODENOSCOPY (EGD) WITH PROPOFOL N/A 12/08/2016   Procedure: ESOPHAGOGASTRODUODENOSCOPY (EGD) WITH PROPOFOL;  Surgeon: DaLucilla LameMD;  Location: [facility] ENDOSCOPY;  Service:  Endoscopy;  Laterality: N/A;  . IVC FILTER INSERTION N/A 02/15/2017   Procedure: IVC Filter Insertion;  Surgeon: Algernon Huxley, MD;  Location: [facility] CV LAB;  Service: Cardiovascular;  Laterality: N/A;  . PORTACATH PLACEMENT Right 12-31-14   Dr Bary Castilla    FAMILY HISTORY :   Family History  Problem Relation Age of Onset  . Breast cancer Maternal Aunt   . Breast cancer Cousin   . Brain cancer Maternal Uncle   . Diabetes Mother   . Hypertension Mother   . Stroke  Mother     SOCIAL HISTORY:   Social History   Tobacco Use  . Smoking status: Light Tobacco Smoker    Packs/day: 0.50    Years: 18.00    Pack years: 9.00    Types: Cigarettes  . Smokeless tobacco: Never Used  Substance Use Topics  . Alcohol use: No    Alcohol/week: 0.0 standard drinks  . Drug use: No    ALLERGIES:  is allergic to no known allergies.  MEDICATIONS:  Current Outpatient Medications  Medication Sig Dispense Refill  . alpelisib (PIQRAY 300MG DAILY DOSE) 2 x 150 MG Therapy Pack Take 2 tablets (344m total) by mouth once daily with food. 60 each 4  . flecainide (TAMBOCOR) 50 MG tablet Take 1 tablet (50 mg total) by mouth 2 (two) times daily. 180 tablet 3  . levocetirizine (XYZAL) 5 MG tablet Take 1 tablet (5 mg total) by mouth every evening. 30 tablet 3  . magnesium oxide (MAG-OX) 400 MG tablet TAKE 1 TABLET (400 MG TOTAL) BY MOUTH 2 (TWO) TIMES DAILY. 60 tablet 6  . metFORMIN (GLUCOPHAGE) 500 MG tablet Take 1 tablet (500 mg total) by mouth 2 (two) times daily with a meal. 60 tablet 4  . pregabalin (LYRICA) 75 MG capsule Take 1 capsule (75 mg total) by mouth 2 (two) times daily. 180 capsule 0  . triamcinolone ointment (KENALOG) 0.5 % Apply 1 application topically 2 (two) times daily. 30 g 2  . Vitamin D, Ergocalciferol, (DRISDOL) 50000 units CAPS capsule TAKE 1 CAPSULE (50,000 UNITS TOTAL) ONCE A WEEK BY MOUTH. 12 capsule 1   No current facility-administered medications for this visit.    Facility-Administered Medications Ordered in Other Visits  Medication Dose Route Frequency Provider Last Rate Last Dose  . 0.9 %  sodium chloride infusion   Intravenous Continuous Pandit, Sandeep, MD      . 0.9 %  sodium chloride infusion   Intravenous Continuous FLloyd Huger MD 999 mL/hr at 04/24/15 1520    . heparin lock flush 100 unit/mL  500 Units Intravenous Once Berenzon, Dmitriy, MD      . sodium chloride 0.9 % injection 10 mL  10 mL Intravenous PRN PLeia Alf MD    10 mL at 04/04/15 1440  . sodium chloride flush (NS) 0.9 % injection 10 mL  10 mL Intravenous PRN Berenzon, Dmitriy, MD      . Tbo-Filgrastim (GRANIX) injection 480 mcg  480 mcg Subcutaneous Once BCammie Sickle MD        PHYSICAL EXAMINATION: ECOG PERFORMANCE STATUS: 1 - Symptomatic but completely ambulatory  BP 126/84 (BP Location: Left Arm, Patient Position: Sitting, Cuff Size: Normal)   Pulse (!) 110   Temp 98.2 F (36.8 C) (Tympanic)   Resp 16   Wt 155 lb (70.3 kg)   BMI 25.02 kg/m   Filed Weights   11/29/18 0920  Weight: 155 lb (70.3 kg)    Physical Exam  Constitutional: She is oriented to person, place, and time and well-developed, well-nourished, and in no distress.  Alone.  Walking herself.  HENT:  Head: Normocephalic and atraumatic.  Mouth/Throat: Oropharynx is clear and moist. No oropharyngeal exudate.  Eyes: Pupils are equal, round, and reactive to light.  Neck: Normal range of motion. Neck supple.  Cardiovascular: Normal rate and regular rhythm.  Pulmonary/Chest: No respiratory distress. She has no wheezes.  Abdominal: Soft. Bowel sounds are normal. She exhibits no distension and no mass. There is no abdominal tenderness. There is no rebound and no guarding.  Musculoskeletal: Normal range of motion.        General: No tenderness or edema.  Neurological: She is alert and oriented to person, place, and time.  Skin: Skin is warm.  hyperpigmentation of the bilateral hand and feet (better)  Psychiatric: Affect normal.       LABORATORY DATA:  I have reviewed the data as listed    Component Value Date/Time   NA 139 11/29/2018 0850   NA 135 03/04/2017 0918   NA 135 01/08/2015 0850   K 3.8 11/29/2018 0850   K 3.7 01/08/2015 0850   CL 103 11/29/2018 0850   CL 101 01/08/2015 0850   CO2 27 11/29/2018 0850   CO2 26 01/08/2015 0850   GLUCOSE 90 11/29/2018 0850   GLUCOSE 161 (H) 01/08/2015 0850   BUN 6 (L) 11/29/2018 0850   BUN 8 03/04/2017 0918    BUN 17 01/08/2015 0850   CREATININE 0.67 11/29/2018 0850   CREATININE 0.82 01/22/2015 1559   CALCIUM 8.8 (L) 11/29/2018 0850   CALCIUM 9.3 01/08/2015 0850   PROT 6.8 11/29/2018 0850   PROT 6.4 03/04/2017 0918   PROT 6.8 01/22/2015 1559   ALBUMIN 3.4 (L) 11/29/2018 0850   ALBUMIN 2.5 (L) 03/04/2017 0918   ALBUMIN 3.9 01/22/2015 1559   AST 65 (H) 11/29/2018 0850   AST 34 01/22/2015 1559   ALT 44 11/29/2018 0850   ALT 42 01/22/2015 1559   ALKPHOS 212 (H) 11/29/2018 0850   ALKPHOS 157 (H) 01/22/2015 1559   BILITOT 0.9 11/29/2018 0850   BILITOT 6.2 (H) 03/04/2017 0918   BILITOT 0.2 (L) 01/22/2015 1559   GFRNONAA >60 11/29/2018 0850   GFRNONAA >60 01/22/2015 1559   GFRAA >60 11/29/2018 0850   GFRAA >60 01/22/2015 1559    No results found for: SPEP, UPEP  Lab Results  Component Value Date   WBC 2.1 (L) 11/29/2018   NEUTROABS 0.9 (L) 11/29/2018   HGB 12.1 11/29/2018   HCT 36.8 11/29/2018   MCV 93.2 11/29/2018   PLT 229 11/29/2018      Chemistry      Component Value Date/Time   NA 139 11/29/2018 0850   NA 135 03/04/2017 0918   NA 135 01/08/2015 0850   K 3.8 11/29/2018 0850   K 3.7 01/08/2015 0850   CL 103 11/29/2018 0850   CL 101 01/08/2015 0850   CO2 27 11/29/2018 0850   CO2 26 01/08/2015 0850   BUN 6 (L) 11/29/2018 0850   BUN 8 03/04/2017 0918   BUN 17 01/08/2015 0850   CREATININE 0.67 11/29/2018 0850   CREATININE 0.82 01/22/2015 1559      Component Value Date/Time   CALCIUM 8.8 (L) 11/29/2018 0850   CALCIUM 9.3 01/08/2015 0850   ALKPHOS 212 (H) 11/29/2018 0850   ALKPHOS 157 (H) 01/22/2015 1559   AST 65 (H) 11/29/2018 0850   AST 34 01/22/2015 1559   ALT 44  11/29/2018 0850   ALT 42 01/22/2015 1559   BILITOT 0.9 11/29/2018 0850   BILITOT 6.2 (H) 03/04/2017 0918   BILITOT 0.2 (L) 01/22/2015 1559       RADIOGRAPHIC STUDIES: I have personally reviewed the radiological images as listed and agreed with the findings in the report. No results found.    ASSESSMENT & PLAN:  Carcinoma of upper-outer quadrant of left breast in female, estrogen receptor positive ([place]) # Recurrent metastatic breast cancer-ER PR positive HER-2 negative; FEB 17th CT- mixed response in liver [few lesion smaller few larger]. Currently on Eribulin [started on 2/25]  # Proceed with cycle #1-d-8 today. Labs today reviewed;  acceptable for treatment today except for low white count/.   #Neutropenia 900/from chemotherapy.  We will plan Granix for 3 days post treatment.  #Elevated alkaline phosphatase- 212- ? Cancer vs medications. Monitor for now. Worse.    # Brain metastases-recurrent/likely leptomeningeal disease;  S/p WBRT [April 2019]. NOv 19th MRI stable-no progressive leptomeningeal disease. Stable. Will repeat in April 2020.   #Peripheral neuropathy grade 1-  on Lyrica; stable.   # increase lacrimation- ?  Nasolacrimal duct occlusion versus allergies; awaiting appt next week.  My card given to the patient; reminded to have the ophthalmologist call me with questions/plans.  # DISPOSITION: # eribulin today # Granix daily x3 day-starting tomorrow  # 2 week- eribulin-MD/ cbc/cmp Dr.B     No orders of the defined types were placed in this encounter.  All questions were answered. The patient knows to call the clinic with any problems, questions or concerns.      Cammie Sickle, MD 11/29/2018 10:01 AM

## 2018-11-29 NOTE — Assessment & Plan Note (Addendum)
#  Recurrent metastatic breast cancer-ER PR positive HER-2 negative; FEB 17th CT- mixed response in liver [few lesion smaller few larger]. Currently on Eribulin [started on 2/25]  # Proceed with cycle #1-d-8 today. Labs today reviewed;  acceptable for treatment today except for low white count/.   #Neutropenia 900/from chemotherapy.  We will plan Granix for 3 days post treatment.  #Elevated alkaline phosphatase- 212- ? Cancer vs medications. Monitor for now. Worse.    # Brain metastases-recurrent/likely leptomeningeal disease;  S/p WBRT [April 2019]. NOv 19th MRI stable-no progressive leptomeningeal disease. Stable. Will repeat in April 2020.   #Peripheral neuropathy grade 1-  on Lyrica; stable.   # increase lacrimation- ?  Nasolacrimal duct occlusion versus allergies; awaiting appt next week.  My card given to the patient; reminded to have the ophthalmologist call me with questions/plans.  # DISPOSITION: # eribulin today # Granix daily x3 day-starting tomorrow  # 2 week- eribulin-MD/ cbc/cmp Dr.B

## 2018-11-30 ENCOUNTER — Other Ambulatory Visit: Payer: Self-pay

## 2018-11-30 ENCOUNTER — Inpatient Hospital Stay: Payer: BLUE CROSS/BLUE SHIELD

## 2018-11-30 ENCOUNTER — Encounter: Payer: Self-pay | Admitting: *Deleted

## 2018-11-30 DIAGNOSIS — C7949 Secondary malignant neoplasm of other parts of nervous system: Secondary | ICD-10-CM | POA: Diagnosis not present

## 2018-11-30 DIAGNOSIS — C50412 Malignant neoplasm of upper-outer quadrant of left female breast: Secondary | ICD-10-CM

## 2018-11-30 DIAGNOSIS — D701 Agranulocytosis secondary to cancer chemotherapy: Secondary | ICD-10-CM | POA: Diagnosis not present

## 2018-11-30 DIAGNOSIS — C7931 Secondary malignant neoplasm of brain: Secondary | ICD-10-CM | POA: Diagnosis not present

## 2018-11-30 DIAGNOSIS — Z170 Estrogen receptor positive status [ER+]: Principal | ICD-10-CM

## 2018-11-30 DIAGNOSIS — R5383 Other fatigue: Secondary | ICD-10-CM | POA: Diagnosis not present

## 2018-11-30 DIAGNOSIS — G629 Polyneuropathy, unspecified: Secondary | ICD-10-CM | POA: Diagnosis not present

## 2018-11-30 DIAGNOSIS — T451X5A Adverse effect of antineoplastic and immunosuppressive drugs, initial encounter: Secondary | ICD-10-CM

## 2018-11-30 DIAGNOSIS — Z5111 Encounter for antineoplastic chemotherapy: Secondary | ICD-10-CM | POA: Diagnosis not present

## 2018-11-30 DIAGNOSIS — T451X5S Adverse effect of antineoplastic and immunosuppressive drugs, sequela: Secondary | ICD-10-CM | POA: Diagnosis not present

## 2018-11-30 DIAGNOSIS — Z79818 Long term (current) use of other agents affecting estrogen receptors and estrogen levels: Secondary | ICD-10-CM | POA: Diagnosis not present

## 2018-11-30 DIAGNOSIS — G620 Drug-induced polyneuropathy: Secondary | ICD-10-CM | POA: Diagnosis not present

## 2018-11-30 DIAGNOSIS — M8580 Other specified disorders of bone density and structure, unspecified site: Secondary | ICD-10-CM | POA: Diagnosis not present

## 2018-11-30 DIAGNOSIS — R5381 Other malaise: Secondary | ICD-10-CM | POA: Diagnosis not present

## 2018-11-30 DIAGNOSIS — I471 Supraventricular tachycardia: Secondary | ICD-10-CM | POA: Diagnosis not present

## 2018-11-30 DIAGNOSIS — R748 Abnormal levels of other serum enzymes: Secondary | ICD-10-CM | POA: Diagnosis not present

## 2018-11-30 DIAGNOSIS — Z7689 Persons encountering health services in other specified circumstances: Secondary | ICD-10-CM | POA: Diagnosis not present

## 2018-11-30 MED ORDER — PREGABALIN 75 MG PO CAPS: 75.0000 mg | ORAL_CAPSULE | Freq: Two times a day (BID) | ORAL | 0 refills | Status: DC

## 2018-11-30 MED ORDER — TBO-FILGRASTIM 480 MCG/0.8ML ~~LOC~~ SOSY
480.0000 ug | PREFILLED_SYRINGE | Freq: Once | SUBCUTANEOUS | Status: AC
  Administered 2018-11-30: 480 ug via SUBCUTANEOUS

## 2018-11-30 NOTE — Telephone Encounter (Signed)
Ok to refill 

## 2018-11-30 NOTE — Progress Notes (Signed)
md printed lyrica rx. rx for 90 days supply [facility].

## 2018-12-01 ENCOUNTER — Inpatient Hospital Stay: Payer: BLUE CROSS/BLUE SHIELD

## 2018-12-01 ENCOUNTER — Other Ambulatory Visit: Payer: Self-pay | Admitting: Internal Medicine

## 2018-12-01 DIAGNOSIS — D701 Agranulocytosis secondary to cancer chemotherapy: Secondary | ICD-10-CM | POA: Diagnosis not present

## 2018-12-01 DIAGNOSIS — Z170 Estrogen receptor positive status [ER+]: Secondary | ICD-10-CM

## 2018-12-01 DIAGNOSIS — I471 Supraventricular tachycardia: Secondary | ICD-10-CM | POA: Diagnosis not present

## 2018-12-01 DIAGNOSIS — R5381 Other malaise: Secondary | ICD-10-CM | POA: Diagnosis not present

## 2018-12-01 DIAGNOSIS — E860 Dehydration: Secondary | ICD-10-CM

## 2018-12-01 DIAGNOSIS — C7949 Secondary malignant neoplasm of other parts of nervous system: Secondary | ICD-10-CM | POA: Diagnosis not present

## 2018-12-01 DIAGNOSIS — Z5111 Encounter for antineoplastic chemotherapy: Secondary | ICD-10-CM | POA: Diagnosis not present

## 2018-12-01 DIAGNOSIS — C7931 Secondary malignant neoplasm of brain: Secondary | ICD-10-CM | POA: Diagnosis not present

## 2018-12-01 DIAGNOSIS — G620 Drug-induced polyneuropathy: Secondary | ICD-10-CM | POA: Diagnosis not present

## 2018-12-01 DIAGNOSIS — Z7689 Persons encountering health services in other specified circumstances: Secondary | ICD-10-CM | POA: Diagnosis not present

## 2018-12-01 DIAGNOSIS — C50412 Malignant neoplasm of upper-outer quadrant of left female breast: Secondary | ICD-10-CM

## 2018-12-01 DIAGNOSIS — R748 Abnormal levels of other serum enzymes: Secondary | ICD-10-CM | POA: Diagnosis not present

## 2018-12-01 DIAGNOSIS — R5383 Other fatigue: Secondary | ICD-10-CM | POA: Diagnosis not present

## 2018-12-01 DIAGNOSIS — G629 Polyneuropathy, unspecified: Secondary | ICD-10-CM | POA: Diagnosis not present

## 2018-12-01 DIAGNOSIS — T451X5S Adverse effect of antineoplastic and immunosuppressive drugs, sequela: Secondary | ICD-10-CM | POA: Diagnosis not present

## 2018-12-01 DIAGNOSIS — Z79818 Long term (current) use of other agents affecting estrogen receptors and estrogen levels: Secondary | ICD-10-CM | POA: Diagnosis not present

## 2018-12-01 DIAGNOSIS — M8580 Other specified disorders of bone density and structure, unspecified site: Secondary | ICD-10-CM | POA: Diagnosis not present

## 2018-12-01 MED ORDER — FILGRASTIM 480 MCG/0.8ML IJ SOSY: 480.0000 ug | PREFILLED_SYRINGE | Freq: Once | INTRAMUSCULAR | Status: DC

## 2018-12-01 MED ORDER — FILGRASTIM 480 MCG/0.8ML IJ SOSY
480.0000 ug | PREFILLED_SYRINGE | Freq: Once | INTRAMUSCULAR | Status: AC
  Administered 2018-12-01: 480 ug via SUBCUTANEOUS
  Filled 2018-12-01: qty 0.8

## 2018-12-01 MED ORDER — TBO-FILGRASTIM 480 MCG/0.8ML ~~LOC~~ SOSY: 480.0000 ug | PREFILLED_SYRINGE | Freq: Once | SUBCUTANEOUS | Status: DC

## 2018-12-02 ENCOUNTER — Inpatient Hospital Stay: Payer: BLUE CROSS/BLUE SHIELD

## 2018-12-02 DIAGNOSIS — M8580 Other specified disorders of bone density and structure, unspecified site: Secondary | ICD-10-CM | POA: Diagnosis not present

## 2018-12-02 DIAGNOSIS — G620 Drug-induced polyneuropathy: Secondary | ICD-10-CM | POA: Diagnosis not present

## 2018-12-02 DIAGNOSIS — Z170 Estrogen receptor positive status [ER+]: Secondary | ICD-10-CM | POA: Diagnosis not present

## 2018-12-02 DIAGNOSIS — C7931 Secondary malignant neoplasm of brain: Secondary | ICD-10-CM | POA: Diagnosis not present

## 2018-12-02 DIAGNOSIS — R5383 Other fatigue: Secondary | ICD-10-CM | POA: Diagnosis not present

## 2018-12-02 DIAGNOSIS — D701 Agranulocytosis secondary to cancer chemotherapy: Secondary | ICD-10-CM | POA: Diagnosis not present

## 2018-12-02 DIAGNOSIS — C7949 Secondary malignant neoplasm of other parts of nervous system: Secondary | ICD-10-CM | POA: Diagnosis not present

## 2018-12-02 DIAGNOSIS — C50412 Malignant neoplasm of upper-outer quadrant of left female breast: Secondary | ICD-10-CM

## 2018-12-02 DIAGNOSIS — G629 Polyneuropathy, unspecified: Secondary | ICD-10-CM | POA: Diagnosis not present

## 2018-12-02 DIAGNOSIS — I471 Supraventricular tachycardia: Secondary | ICD-10-CM | POA: Diagnosis not present

## 2018-12-02 DIAGNOSIS — Z7689 Persons encountering health services in other specified circumstances: Secondary | ICD-10-CM | POA: Diagnosis not present

## 2018-12-02 DIAGNOSIS — E860 Dehydration: Secondary | ICD-10-CM

## 2018-12-02 DIAGNOSIS — Z5111 Encounter for antineoplastic chemotherapy: Secondary | ICD-10-CM | POA: Diagnosis not present

## 2018-12-02 DIAGNOSIS — Z79818 Long term (current) use of other agents affecting estrogen receptors and estrogen levels: Secondary | ICD-10-CM | POA: Diagnosis not present

## 2018-12-02 DIAGNOSIS — R5381 Other malaise: Secondary | ICD-10-CM | POA: Diagnosis not present

## 2018-12-02 DIAGNOSIS — T451X5S Adverse effect of antineoplastic and immunosuppressive drugs, sequela: Secondary | ICD-10-CM | POA: Diagnosis not present

## 2018-12-02 DIAGNOSIS — R748 Abnormal levels of other serum enzymes: Secondary | ICD-10-CM | POA: Diagnosis not present

## 2018-12-02 MED ORDER — FILGRASTIM 480 MCG/0.8ML IJ SOSY
480.0000 ug | PREFILLED_SYRINGE | Freq: Once | INTRAMUSCULAR | Status: AC
  Administered 2018-12-02: 480 ug via SUBCUTANEOUS
  Filled 2018-12-02: qty 0.8

## 2018-12-05 MED ORDER — PREGABALIN 75 MG PO CAPS: 75.0000 mg | ORAL_CAPSULE | Freq: Two times a day (BID) | ORAL | 0 refills | Status: DC

## 2018-12-06 DIAGNOSIS — H2512 Age-related nuclear cataract, left eye: Secondary | ICD-10-CM | POA: Insufficient documentation

## 2018-12-06 DIAGNOSIS — E1136 Type 2 diabetes mellitus with diabetic cataract: Secondary | ICD-10-CM | POA: Diagnosis not present

## 2018-12-06 DIAGNOSIS — H25813 Combined forms of age-related cataract, bilateral: Secondary | ICD-10-CM | POA: Insufficient documentation

## 2018-12-09 ENCOUNTER — Other Ambulatory Visit: Payer: Self-pay | Admitting: Physician Assistant

## 2018-12-13 ENCOUNTER — Other Ambulatory Visit: Payer: Self-pay

## 2018-12-13 ENCOUNTER — Inpatient Hospital Stay: Payer: BLUE CROSS/BLUE SHIELD

## 2018-12-13 ENCOUNTER — Encounter: Payer: Self-pay | Admitting: Internal Medicine

## 2018-12-13 ENCOUNTER — Inpatient Hospital Stay (HOSPITAL_BASED_OUTPATIENT_CLINIC_OR_DEPARTMENT_OTHER): Payer: BLUE CROSS/BLUE SHIELD | Admitting: Internal Medicine

## 2018-12-13 ENCOUNTER — Inpatient Hospital Stay: Payer: BLUE CROSS/BLUE SHIELD | Admitting: *Deleted

## 2018-12-13 VITALS — BP 144/93 | HR 111 | Temp 98.1°F | Resp 16 | Wt 154.0 lb

## 2018-12-13 DIAGNOSIS — M8580 Other specified disorders of bone density and structure, unspecified site: Secondary | ICD-10-CM | POA: Diagnosis not present

## 2018-12-13 DIAGNOSIS — C7931 Secondary malignant neoplasm of brain: Secondary | ICD-10-CM

## 2018-12-13 DIAGNOSIS — R5381 Other malaise: Secondary | ICD-10-CM | POA: Diagnosis not present

## 2018-12-13 DIAGNOSIS — C50412 Malignant neoplasm of upper-outer quadrant of left female breast: Secondary | ICD-10-CM | POA: Diagnosis not present

## 2018-12-13 DIAGNOSIS — C7949 Secondary malignant neoplasm of other parts of nervous system: Secondary | ICD-10-CM | POA: Diagnosis not present

## 2018-12-13 DIAGNOSIS — Z5111 Encounter for antineoplastic chemotherapy: Secondary | ICD-10-CM | POA: Diagnosis not present

## 2018-12-13 DIAGNOSIS — Z7689 Persons encountering health services in other specified circumstances: Secondary | ICD-10-CM | POA: Diagnosis not present

## 2018-12-13 DIAGNOSIS — Z95828 Presence of other vascular implants and grafts: Secondary | ICD-10-CM

## 2018-12-13 DIAGNOSIS — Z170 Estrogen receptor positive status [ER+]: Secondary | ICD-10-CM

## 2018-12-13 DIAGNOSIS — I471 Supraventricular tachycardia: Secondary | ICD-10-CM | POA: Diagnosis not present

## 2018-12-13 DIAGNOSIS — E860 Dehydration: Secondary | ICD-10-CM

## 2018-12-13 DIAGNOSIS — G629 Polyneuropathy, unspecified: Secondary | ICD-10-CM | POA: Diagnosis not present

## 2018-12-13 DIAGNOSIS — Z79818 Long term (current) use of other agents affecting estrogen receptors and estrogen levels: Secondary | ICD-10-CM | POA: Diagnosis not present

## 2018-12-13 DIAGNOSIS — R748 Abnormal levels of other serum enzymes: Secondary | ICD-10-CM | POA: Diagnosis not present

## 2018-12-13 DIAGNOSIS — G620 Drug-induced polyneuropathy: Secondary | ICD-10-CM | POA: Diagnosis not present

## 2018-12-13 DIAGNOSIS — D701 Agranulocytosis secondary to cancer chemotherapy: Secondary | ICD-10-CM | POA: Diagnosis not present

## 2018-12-13 DIAGNOSIS — R5383 Other fatigue: Secondary | ICD-10-CM | POA: Diagnosis not present

## 2018-12-13 DIAGNOSIS — T451X5S Adverse effect of antineoplastic and immunosuppressive drugs, sequela: Secondary | ICD-10-CM | POA: Diagnosis not present

## 2018-12-13 LAB — CBC WITH DIFFERENTIAL/PLATELET
Abs Immature Granulocytes: 0 10*3/uL (ref 0.00–0.07)
Basophils Absolute: 0 10*3/uL (ref 0.0–0.1)
Basophils Relative: 1 %
EOS PCT: 0 %
Eosinophils Absolute: 0 10*3/uL (ref 0.0–0.5)
HCT: 38.8 % (ref 36.0–46.0)
Hemoglobin: 12.6 g/dL (ref 12.0–15.0)
Immature Granulocytes: 0 %
Lymphocytes Relative: 47 %
Lymphs Abs: 0.8 10*3/uL (ref 0.7–4.0)
MCH: 30.9 pg (ref 26.0–34.0)
MCHC: 32.5 g/dL (ref 30.0–36.0)
MCV: 95.1 fL (ref 80.0–100.0)
MONO ABS: 0.3 10*3/uL (ref 0.1–1.0)
Monocytes Relative: 20 %
Neutro Abs: 0.5 10*3/uL — ABNORMAL LOW (ref 1.7–7.7)
Neutrophils Relative %: 32 %
Platelets: 309 10*3/uL (ref 150–400)
RBC: 4.08 MIL/uL (ref 3.87–5.11)
RDW: 16.5 % — ABNORMAL HIGH (ref 11.5–15.5)
WBC: 1.6 10*3/uL — ABNORMAL LOW (ref 4.0–10.5)
nRBC: 1.3 % — ABNORMAL HIGH (ref 0.0–0.2)

## 2018-12-13 LAB — COMPREHENSIVE METABOLIC PANEL
ALT: 31 U/L (ref 0–44)
AST: 49 U/L — ABNORMAL HIGH (ref 15–41)
Albumin: 3.7 g/dL (ref 3.5–5.0)
Alkaline Phosphatase: 219 U/L — ABNORMAL HIGH (ref 38–126)
Anion gap: 8 (ref 5–15)
BUN: 7 mg/dL — AB (ref 8–23)
CO2: 27 mmol/L (ref 22–32)
CREATININE: 0.88 mg/dL (ref 0.44–1.00)
Calcium: 8.8 mg/dL — ABNORMAL LOW (ref 8.9–10.3)
Chloride: 103 mmol/L (ref 98–111)
GFR calc Af Amer: >60 mL/min (ref >60)
GFR calc non Af Amer: >60 mL/min (ref >60)
Glucose, Bld: 87 mg/dL (ref 70–99)
Potassium: 4.1 mmol/L (ref 3.5–5.1)
Sodium: 138 mmol/L (ref 135–145)
Total Bilirubin: 1.1 mg/dL (ref 0.3–1.2)
Total Protein: 6.8 g/dL (ref 6.5–8.1)

## 2018-12-13 MED ORDER — SODIUM CHLORIDE 0.9% FLUSH
10.0000 mL | Freq: Once | INTRAVENOUS | Status: AC
  Administered 2018-12-13: 10 mL via INTRAVENOUS
  Filled 2018-12-13: qty 10

## 2018-12-13 MED ORDER — FILGRASTIM 480 MCG/0.8ML IJ SOSY
480.0000 ug | PREFILLED_SYRINGE | Freq: Once | INTRAMUSCULAR | Status: AC
  Administered 2018-12-13: 480 ug via SUBCUTANEOUS
  Filled 2018-12-13: qty 0.8

## 2018-12-13 MED ORDER — HEPARIN SOD (PORK) LOCK FLUSH 100 UNIT/ML IV SOLN
500.0000 [IU] | Freq: Once | INTRAVENOUS | Status: AC
  Administered 2018-12-13: 500 [IU] via INTRAVENOUS

## 2018-12-13 NOTE — Assessment & Plan Note (Addendum)
#  Recurrent metastatic breast cancer-ER PR positive HER-2 negative; FEB 17th CT- mixed response in liver [few lesion smaller few larger]. Currently on Eribulin [started on 2/25]  # HOLD cycle number 2-day 1 today because of severe neutropenia.  See below.  If this continues to problem would recommend further dose reductions.  #Neutropenia 500/from chemotherapy.  HOLD chemo today. Proceed with granix today- thru Friday.   #Elevated alkaline phosphatase- 2 ? Cancer vs medications. Monitor for now.  Worsening  # Brain metastases-recurrent/likely leptomeningeal disease;  S/p WBRT [April 2019]. NOv 19th MRI stable-no progressive leptomeningeal disease. Stable.  Will order MRI at next visit.  #Peripheral neuropathy grade 1-  on Lyrica; stable  # increase lacrimation- ?  Cataracts as per ophthalmology.  Await cataract surgery given chemotherapy.   # Educated the patient regarding novel coronavirus-modes of transmission/risks; and measures to avoid infection.   # DISPOSITION: # HOLD eribulin today # Granix daily x 4 days- starting TODAY- thru 3/20 # follow up in 1 week- Eribulin- MD-cbc/cmp-Dr.B

## 2018-12-13 NOTE — Progress Notes (Signed)
[facility] OFFICE PROGRESS NOTE  Patient Care Team: Steele Sizer, MD as PCP - General (Family Medicine) Wellington Hampshire, MD as PCP - Cardiology (Cardiology) Bary Castilla, Forest Gleason, MD (General Surgery) [name], MD (Inactive) as Attending Physician (Internal Medicine)  Cancer Staging No matching staging information was found for the patient.   Oncology History   # LEFT BREAST IDC; STAGE II [cT2N1] ER >90%; PR- 50-90%; her 2 NEU- POS; s/p Neoadj chemo; AUG 2016- TCH+P s/p Lumpec & partial ALND- path CR; s/p RT [finished Nov 2016];  adj Herceptin; HELD for Jan 19th 2017 [in DEC 2016-EF dropped from 63 to 51%; FEB 27th EF-42.9%]; JAN 2016 START Arimidex; May 2017- EF- 60%; May 24th 2017-Re-start Herceptin q 3W; July 28th STOP herceptin [finished 31m/m2; sec to Low EF; however 2017 Oct EF= 67%; improved]  # DEC 4th 2017- Start Neratinib 4 pills; DEC 11th 3 pills; STOPPED.  # FEB 2018- RECURRENCE ER/PR positive; Her 2 NEGATIVE [liver Bx]  # MARCH 1st 2018- Tax-Cytoxan [poor tol]  # FEB 2018- Brain mets [SBRT; Dr.Crystal; finished Feb 28th 2018]  # March 2018- Eribulin- poor tolerance  # June 8th 2018- Started Faslodex + Abema [abema-multiple interruptions sec to neutropenia]  # MRI Brain- March 2019- Leptomeningeal mets [WBRT; No LP s/p WBRT- 01/09/2018]; STOP Abema [528mday-sec to severe cytopenia]+faslodex  # MAY 20tth 019- Start Xeloda; STOPPED in Nov 22nd 2019- sec to Progressive liver lesions on CT scan  # NOV 26th 2019- Faslodex + Piqaray 25027m; Jan 2020- Piqray 300 mg+ faslodex; FEB 17th CT scan- mixed response/ Overall progression; STOP Piqray + faslodex.   # FEB 25th 2020- ERIBULIN.   # PN- G-2; may 2017- Cymbalta 102m72mMARCH 2018-  acute vascular insuff of BIL LE [? Taxotere vasoconstriction on asprin]; # hemorragic shock LGIB [s/p colo; Dr.Wohl]  #  SVT- on flecainide.   # April 2016- Liver Bx- NEG;   # Drop in EF from Herceptin [recovered  OCt 27th- EF 65%]  # BMD- jan 2017- osteopenia  -----------------------------------------------------------     [place]- Sep 4th 2018- PDL-1 0%/NEG for BRCA; MSI-Stable; Positive for PI3K; ccnd-1; FGF** -----------------------------------------------------------  Dx: Metastatic Breast cancer [ER/PR-Pos; her -2 NEG] Stage IV; goals: palliative Current treatment- ERIBULIN.      Carcinoma of upper-outer quadrant of left breast in female, estrogen receptor positive (HCC)[place]2/[place] -  Chemotherapy    The patient had eriBULin mesylate ([place]EN) 2.1 mg in sodium chloride 0.9 % 100 mL chemo infusion, 1.2 mg/m2 = 2.1 mg (100 % of original dose 1.2 mg/m2), Intravenous,  Once, 1 of 4 cycles Dose modification: 1.2 mg/m2 (original dose 1.2 mg/m2, Cycle 1, Reason: Provider Judgment) Administration: 2.1 mg (11/22/2018), 2.1 mg (11/29/2018)  for chemotherapy treatment.      INTERVAL HISTORY:  Sarah RUDDENy21.  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  Patient recently evaluated ophthalmology.  Concerned about lacrimation secondary history of cataracts.   Chronic mild fatigue.  Chronic tingling and numbness.  No fevers or chills.  Review of Systems  Constitutional: Positive for malaise/fatigue. Negative for chills, diaphoresis, fever and weight loss.  HENT: Negative for nosebleeds and sore throat.   Eyes: Negative for double vision.  Respiratory: Negative for cough, hemoptysis, sputum production, shortness of breath and wheezing.   Cardiovascular: Negative for chest pain, palpitations, orthopnea and leg swelling.  Gastrointestinal: Negative for abdominal  pain, blood in stool, constipation, heartburn, melena, nausea and vomiting.  Genitourinary: Negative for dysuria, frequency and urgency.  Musculoskeletal: Positive for joint pain. Negative  for back pain.  Skin: Negative for itching.  Neurological: Positive for tingling. Negative for dizziness, focal weakness, weakness and headaches.  Endo/Heme/Allergies: Does not bruise/bleed easily.  Psychiatric/Behavioral: Negative for depression. The patient is not nervous/anxious and does not have insomnia.       PAST MEDICAL HISTORY :  Past Medical History:  Diagnosis Date  . Chemotherapy-induced peripheral neuropathy ([place])   . Epistaxis    a. 11/2016 in setting of asa/plavix-->silver nitrate cauterization.  . GI bleed    a. 11/2016 Admission w/ GIB and hypovolemic shock req 3u PRBC's;  b. 11/2016 ECG: gastritis & nonbleeding peptic ulcer; c. 11/2016 Conlonoscopy: rectal and sigmoid colonic ulcers.  . Heart attack ([place])    a. 1998 Cath @ [facility]: reportedly no intervention required.  Marland Kitchen Herceptin-induced cardiomyopathy ([place])    a. In the setting of Herceptin Rx for breast cancer (initiated 12/2014); b. 03/2015 MUGA EF 64%; b. 08/2015 MUGA: EF 51%; c. 10/2015 MUGA: EF 44%; d. 11/2015 Echo: EF 45-50%; e. 01/2016 MUGA: EF 60%; f. 06/2016 MUGA EF 65%; g. 10/2016 MUGA: EF 61%;  h. 12/2016 Echo: EF 55-60%, gr1 DD.  Marland Kitchen Hyperlipidemia   . Hypertension   . Neuropathy   . Possible PAD (peripheral artery disease) ([place])    a. 11/2016 LE cyanosis and weak pulses-->CTA w/o significant Ao-BiFem dzs. ? distal dzs-->ASA/Plavix initiated by vascular surgery.  Marland Kitchen PSVT (paroxysmal supraventricular tachycardia) ([place])    a. Dx 11/2016.  . Pulmonary embolism ([place])    a. 12/2016 CTA Chest: small nonocclusive PE in inferior segment of the Left lingula, somewhat eccentric filling defect suggesting chronic rather than acute embolic event; b. 09/5613 LE U/S:  No DVT; c. 12/2016 Echo: Nl RV fxn, nl PASP.  Marland Kitchen Recurrent Metastatic breast cancer ([place])    a. Dx 2016: Stage II, ER positive, PR positive, HER-2/neu overexpressing of the left breast-->chemo/radiation; b. 10/2016 CT Abd/pelvis: diffuse liver mets, ill defined sclerotic bone  lesions-T12;  c. 10/2016 MRI brain: metastatic lesion along L temporal lobe (19x39m) w/ extensive surrounding edema & 584mmidline shift to right.  . Sinus tachycardia     PAST SURGICAL HISTORY :   Past Surgical History:  Procedure Laterality Date  . BREAST BIOPSY Left 2016   Positive  . BREAST LUMPECTOMY WITH SENTINEL LYMPH NODE BIOPSY Left 05/23/2015   Procedure: LEFT BREAST WIDE EXCISION WITH AXILLARY DISSECTION, MASTOPLASTY ;  Surgeon: JeRobert BellowMD;  Location: [facility] ORS;  Service: General;  Laterality: Left;  . BREAST SURGERY Left 12/18/14   breast biopsy/INVASIVE DUCTAL CARCINOMA OF BREAST, NOTTINGHAM GRADE 2.  . Marland KitchenREAST SURGERY  05/23/2015.   Wide excision/mastoplasty, axillary dissection. No residual invasive cancer, positive for residual DCIS. 0/2 nodes identified on axillary dissection. (no SLN by technetium or methylene blue)  . CARDIAC CATHETERIZATION    . COLONOSCOPY WITH PROPOFOL N/A 12/10/2016   Procedure: COLONOSCOPY WITH PROPOFOL;  Surgeon: DaLucilla LameMD;  Location: [facility] ENDOSCOPY;  Service: Endoscopy;  Laterality: N/A;  . COLONOSCOPY WITH PROPOFOL N/A 02/13/2017   Procedure: COLONOSCOPY WITH PROPOFOL;  Surgeon: WoLucilla LameMD;  [facility];  Service: Endoscopy;  Laterality: N/A;  . ESOPHAGOGASTRODUODENOSCOPY (EGD) WITH PROPOFOL N/A 12/08/2016   Procedure: ESOPHAGOGASTRODUODENOSCOPY (EGD) WITH PROPOFOL;  Surgeon: DaLucilla LameMD;  Location: [facility] ENDOSCOPY;  Service: Endoscopy;  Laterality: N/A;  . IVC FILTER INSERTION N/A  02/15/2017   Procedure: IVC Filter Insertion;  Surgeon: Algernon Huxley, MD;  Location: [facility];  Service: Cardiovascular;  Laterality: N/A;  . PORTACATH PLACEMENT Right 12-31-14   Dr Bary Castilla    FAMILY HISTORY :   Family History  Problem Relation Age of Onset  . Breast cancer Maternal Aunt   . Breast cancer Cousin   . Brain cancer Maternal Uncle   . Diabetes Mother   . Hypertension Mother   . Stroke Mother     SOCIAL  HISTORY:   Social History   Tobacco Use  . Smoking status: Light Tobacco Smoker    Packs/day: 0.50    Years: 18.00    Pack years: 9.00    Types: Cigarettes  . Smokeless tobacco: Never Used  Substance Use Topics  . Alcohol use: No    Alcohol/week: 0.0 standard drinks  . Drug use: No    ALLERGIES:  is allergic to no known allergies.  MEDICATIONS:  Current Outpatient Medications  Medication Sig Dispense Refill  . alpelisib (PIQRAY 300MG DAILY DOSE) 2 x 150 MG Therapy Pack Take 2 tablets (383m total) by mouth once daily with food. 60 each 4  . flecainide (TAMBOCOR) 50 MG tablet Take 1 tablet (50 mg total) by mouth 2 (two) times daily. 180 tablet 3  . levocetirizine (XYZAL) 5 MG tablet Take 1 tablet (5 mg total) by mouth every evening. 30 tablet 3  . magnesium oxide (MAG-OX) 400 MG tablet TAKE 1 TABLET BY MOUTH TWICE A DAY 60 tablet 6  . metFORMIN (GLUCOPHAGE) 500 MG tablet Take 1 tablet (500 mg total) by mouth 2 (two) times daily with a meal. 60 tablet 4  . pregabalin (LYRICA) 75 MG capsule Take 1 capsule (75 mg total) by mouth 2 (two) times daily. 180 capsule 0  . triamcinolone ointment (KENALOG) 0.5 % Apply 1 application topically 2 (two) times daily. 30 g 2  . Vitamin D, Ergocalciferol, (DRISDOL) 50000 units CAPS capsule TAKE 1 CAPSULE (50,000 UNITS TOTAL) ONCE A WEEK BY MOUTH. 12 capsule 1   No current facility-administered medications for this visit.    Facility-Administered Medications Ordered in Other Visits  Medication Dose Route Frequency Provider Last Rate Last Dose  . 0.9 %  sodium chloride infusion   Intravenous Continuous Pandit, Sandeep, MD      . 0.9 %  sodium chloride infusion   Intravenous Continuous FLloyd Huger MD 999 mL/hr at 04/24/15 1520    . heparin lock flush 100 unit/mL  500 Units Intravenous Once Berenzon, Dmitriy, MD      . sodium chloride 0.9 % injection 10 mL  10 mL Intravenous PRN PLeia Alf MD   10 mL at 04/04/15 1440  . sodium chloride  flush (NS) 0.9 % injection 10 mL  10 mL Intravenous PRN Berenzon, Dmitriy, MD      . Tbo-Filgrastim (GRANIX) injection 480 mcg  480 mcg Subcutaneous Once BCammie Sickle MD        PHYSICAL EXAMINATION: ECOG PERFORMANCE STATUS: 1 - Symptomatic but completely ambulatory  BP (!) 144/93 (BP Location: Left Arm, Patient Position: Sitting, Cuff Size: Normal)   Pulse (!) 111   Temp 98.1 F (36.7 C) (Tympanic)   Resp 16   Wt 154 lb (69.9 kg)   BMI 24.86 kg/m   Filed Weights   12/13/18 0939  Weight: 154 lb (69.9 kg)    Physical Exam  Constitutional: She is oriented to person, place, and time and well-developed, well-nourished,  and in no distress.  Alone.  Walking herself.  HENT:  Head: Normocephalic and atraumatic.  Mouth/Throat: Oropharynx is clear and moist. No oropharyngeal exudate.  Eyes: Pupils are equal, round, and reactive to light.  Neck: Normal range of motion. Neck supple.  Cardiovascular: Normal rate and regular rhythm.  Pulmonary/Chest: No respiratory distress. She has no wheezes.  Abdominal: Soft. Bowel sounds are normal. She exhibits no distension and no mass. There is no abdominal tenderness. There is no rebound and no guarding.  Musculoskeletal: Normal range of motion.        General: No tenderness or edema.  Neurological: She is alert and oriented to person, place, and time.  Skin: Skin is warm.  hyperpigmentation of the bilateral hand and feet (better)  Psychiatric: Affect normal.       LABORATORY DATA:  I have reviewed the data as listed    Component Value Date/Time   NA 138 12/13/2018 0912   NA 135 03/04/2017 0918   NA 135 01/08/2015 0850   K 4.1 12/13/2018 0912   K 3.7 01/08/2015 0850   CL 103 12/13/2018 0912   CL 101 01/08/2015 0850   CO2 27 12/13/2018 0912   CO2 26 01/08/2015 0850   GLUCOSE 87 12/13/2018 0912   GLUCOSE 161 (H) 01/08/2015 0850   BUN 7 (L) 12/13/2018 0912   BUN 8 03/04/2017 0918   BUN 17 01/08/2015 0850   CREATININE 0.88  12/13/2018 0912   CREATININE 0.82 01/22/2015 1559   CALCIUM 8.8 (L) 12/13/2018 0912   CALCIUM 9.3 01/08/2015 0850   PROT 6.8 12/13/2018 0912   PROT 6.4 03/04/2017 0918   PROT 6.8 01/22/2015 1559   ALBUMIN 3.7 12/13/2018 0912   ALBUMIN 2.5 (L) 03/04/2017 0918   ALBUMIN 3.9 01/22/2015 1559   AST 49 (H) 12/13/2018 0912   AST 34 01/22/2015 1559   ALT 31 12/13/2018 0912   ALT 42 01/22/2015 1559   ALKPHOS 219 (H) 12/13/2018 0912   ALKPHOS 157 (H) 01/22/2015 1559   BILITOT 1.1 12/13/2018 0912   BILITOT 6.2 (H) 03/04/2017 0918   BILITOT 0.2 (L) 01/22/2015 1559   GFRNONAA >60 12/13/2018 0912   GFRNONAA >60 01/22/2015 1559   GFRAA >60 12/13/2018 0912   GFRAA >60 01/22/2015 1559    No results found for: SPEP, UPEP  Lab Results  Component Value Date   WBC 1.6 (L) 12/13/2018   NEUTROABS 0.5 (L) 12/13/2018   HGB 12.6 12/13/2018   HCT 38.8 12/13/2018   MCV 95.1 12/13/2018   PLT 309 12/13/2018      Chemistry      Component Value Date/Time   NA 138 12/13/2018 0912   NA 135 03/04/2017 0918   NA 135 01/08/2015 0850   K 4.1 12/13/2018 0912   K 3.7 01/08/2015 0850   CL 103 12/13/2018 0912   CL 101 01/08/2015 0850   CO2 27 12/13/2018 0912   CO2 26 01/08/2015 0850   BUN 7 (L) 12/13/2018 0912   BUN 8 03/04/2017 0918   BUN 17 01/08/2015 0850   CREATININE 0.88 12/13/2018 0912   CREATININE 0.82 01/22/2015 1559      Component Value Date/Time   CALCIUM 8.8 (L) 12/13/2018 0912   CALCIUM 9.3 01/08/2015 0850   ALKPHOS 219 (H) 12/13/2018 0912   ALKPHOS 157 (H) 01/22/2015 1559   AST 49 (H) 12/13/2018 0912   AST 34 01/22/2015 1559   ALT 31 12/13/2018 0912   ALT 42 01/22/2015 1559   BILITOT 1.1 12/13/2018  0912   BILITOT 6.2 (H) 03/04/2017 0918   BILITOT 0.2 (L) 01/22/2015 1559       RADIOGRAPHIC STUDIES: I have personally reviewed the radiological images as listed and agreed with the findings in the report. No results found.   ASSESSMENT & PLAN:  Carcinoma of upper-outer  quadrant of left breast in female, estrogen receptor positive ([place]) # Recurrent metastatic breast cancer-ER PR positive HER-2 negative; FEB 17th CT- mixed response in liver [few lesion smaller few larger]. Currently on Eribulin [started on 2/25]  # HOLD cycle number 2-day 1 today because of severe neutropenia.  See below.  If this continues to problem would recommend further dose reductions.  #Neutropenia 500/from chemotherapy.  HOLD chemo today. Proceed with granix today- thru Friday.   #Elevated alkaline phosphatase- 2 ? Cancer vs medications. Monitor for now.  Worsening  # Brain metastases-recurrent/likely leptomeningeal disease;  S/p WBRT [April 2019]. NOv 19th MRI stable-no progressive leptomeningeal disease. Stable.  Will order MRI at next visit.  #Peripheral neuropathy grade 1-  on Lyrica; stable  # increase lacrimation- ?  Cataracts as per ophthalmology.  Await cataract surgery given chemotherapy.   # Educated the patient regarding novel coronavirus-modes of transmission/risks; and measures to avoid infection.   # DISPOSITION: # HOLD eribulin today # Granix daily x 4 days- starting TODAY- thru 3/20 # follow up in 1 week- Eribulin- MD-cbc/cmp-Dr.B      No orders of the defined types were placed in this encounter.  All questions were answered. The patient knows to call the clinic with any problems, questions or concerns.      Cammie Sickle, MD 12/14/2018 8:34 AM

## 2018-12-14 ENCOUNTER — Other Ambulatory Visit: Payer: Self-pay

## 2018-12-14 ENCOUNTER — Inpatient Hospital Stay: Payer: BLUE CROSS/BLUE SHIELD

## 2018-12-14 DIAGNOSIS — I471 Supraventricular tachycardia: Secondary | ICD-10-CM | POA: Diagnosis not present

## 2018-12-14 DIAGNOSIS — Z170 Estrogen receptor positive status [ER+]: Secondary | ICD-10-CM | POA: Diagnosis not present

## 2018-12-14 DIAGNOSIS — M8580 Other specified disorders of bone density and structure, unspecified site: Secondary | ICD-10-CM | POA: Diagnosis not present

## 2018-12-14 DIAGNOSIS — C50412 Malignant neoplasm of upper-outer quadrant of left female breast: Secondary | ICD-10-CM | POA: Diagnosis not present

## 2018-12-14 DIAGNOSIS — Z79818 Long term (current) use of other agents affecting estrogen receptors and estrogen levels: Secondary | ICD-10-CM | POA: Diagnosis not present

## 2018-12-14 DIAGNOSIS — C7931 Secondary malignant neoplasm of brain: Secondary | ICD-10-CM | POA: Diagnosis not present

## 2018-12-14 DIAGNOSIS — E860 Dehydration: Secondary | ICD-10-CM

## 2018-12-14 DIAGNOSIS — R5383 Other fatigue: Secondary | ICD-10-CM | POA: Diagnosis not present

## 2018-12-14 DIAGNOSIS — Z5111 Encounter for antineoplastic chemotherapy: Secondary | ICD-10-CM | POA: Diagnosis not present

## 2018-12-14 DIAGNOSIS — D701 Agranulocytosis secondary to cancer chemotherapy: Secondary | ICD-10-CM | POA: Diagnosis not present

## 2018-12-14 DIAGNOSIS — Z7689 Persons encountering health services in other specified circumstances: Secondary | ICD-10-CM | POA: Diagnosis not present

## 2018-12-14 DIAGNOSIS — C7949 Secondary malignant neoplasm of other parts of nervous system: Secondary | ICD-10-CM | POA: Diagnosis not present

## 2018-12-14 DIAGNOSIS — R5381 Other malaise: Secondary | ICD-10-CM | POA: Diagnosis not present

## 2018-12-14 DIAGNOSIS — R748 Abnormal levels of other serum enzymes: Secondary | ICD-10-CM | POA: Diagnosis not present

## 2018-12-14 DIAGNOSIS — G629 Polyneuropathy, unspecified: Secondary | ICD-10-CM | POA: Diagnosis not present

## 2018-12-14 DIAGNOSIS — T451X5S Adverse effect of antineoplastic and immunosuppressive drugs, sequela: Secondary | ICD-10-CM | POA: Diagnosis not present

## 2018-12-14 DIAGNOSIS — G620 Drug-induced polyneuropathy: Secondary | ICD-10-CM | POA: Diagnosis not present

## 2018-12-14 MED ORDER — FILGRASTIM 480 MCG/0.8ML IJ SOSY
480.0000 ug | PREFILLED_SYRINGE | Freq: Once | INTRAMUSCULAR | Status: AC
  Administered 2018-12-14: 480 ug via SUBCUTANEOUS
  Filled 2018-12-14: qty 0.8

## 2018-12-15 ENCOUNTER — Inpatient Hospital Stay: Payer: BLUE CROSS/BLUE SHIELD

## 2018-12-15 ENCOUNTER — Other Ambulatory Visit: Payer: Self-pay

## 2018-12-15 DIAGNOSIS — G629 Polyneuropathy, unspecified: Secondary | ICD-10-CM | POA: Diagnosis not present

## 2018-12-15 DIAGNOSIS — E860 Dehydration: Secondary | ICD-10-CM

## 2018-12-15 DIAGNOSIS — C7931 Secondary malignant neoplasm of brain: Secondary | ICD-10-CM | POA: Diagnosis not present

## 2018-12-15 DIAGNOSIS — D701 Agranulocytosis secondary to cancer chemotherapy: Secondary | ICD-10-CM | POA: Diagnosis not present

## 2018-12-15 DIAGNOSIS — Z7689 Persons encountering health services in other specified circumstances: Secondary | ICD-10-CM | POA: Diagnosis not present

## 2018-12-15 DIAGNOSIS — M8580 Other specified disorders of bone density and structure, unspecified site: Secondary | ICD-10-CM | POA: Diagnosis not present

## 2018-12-15 DIAGNOSIS — Z170 Estrogen receptor positive status [ER+]: Secondary | ICD-10-CM

## 2018-12-15 DIAGNOSIS — R5381 Other malaise: Secondary | ICD-10-CM | POA: Diagnosis not present

## 2018-12-15 DIAGNOSIS — Z79818 Long term (current) use of other agents affecting estrogen receptors and estrogen levels: Secondary | ICD-10-CM | POA: Diagnosis not present

## 2018-12-15 DIAGNOSIS — R748 Abnormal levels of other serum enzymes: Secondary | ICD-10-CM | POA: Diagnosis not present

## 2018-12-15 DIAGNOSIS — G620 Drug-induced polyneuropathy: Secondary | ICD-10-CM | POA: Diagnosis not present

## 2018-12-15 DIAGNOSIS — C50412 Malignant neoplasm of upper-outer quadrant of left female breast: Secondary | ICD-10-CM | POA: Diagnosis not present

## 2018-12-15 DIAGNOSIS — T451X5S Adverse effect of antineoplastic and immunosuppressive drugs, sequela: Secondary | ICD-10-CM | POA: Diagnosis not present

## 2018-12-15 DIAGNOSIS — R5383 Other fatigue: Secondary | ICD-10-CM | POA: Diagnosis not present

## 2018-12-15 DIAGNOSIS — C7949 Secondary malignant neoplasm of other parts of nervous system: Secondary | ICD-10-CM | POA: Diagnosis not present

## 2018-12-15 DIAGNOSIS — I471 Supraventricular tachycardia: Secondary | ICD-10-CM | POA: Diagnosis not present

## 2018-12-15 DIAGNOSIS — Z5111 Encounter for antineoplastic chemotherapy: Secondary | ICD-10-CM | POA: Diagnosis not present

## 2018-12-15 MED ORDER — FILGRASTIM 480 MCG/0.8ML IJ SOSY
480.0000 ug | PREFILLED_SYRINGE | Freq: Once | INTRAMUSCULAR | Status: AC
  Administered 2018-12-15: 480 ug via SUBCUTANEOUS
  Filled 2018-12-15: qty 0.8

## 2018-12-16 ENCOUNTER — Inpatient Hospital Stay: Payer: BLUE CROSS/BLUE SHIELD

## 2018-12-16 ENCOUNTER — Other Ambulatory Visit: Payer: Self-pay

## 2018-12-16 DIAGNOSIS — Z5111 Encounter for antineoplastic chemotherapy: Secondary | ICD-10-CM | POA: Diagnosis not present

## 2018-12-16 DIAGNOSIS — M8580 Other specified disorders of bone density and structure, unspecified site: Secondary | ICD-10-CM | POA: Diagnosis not present

## 2018-12-16 DIAGNOSIS — Z79818 Long term (current) use of other agents affecting estrogen receptors and estrogen levels: Secondary | ICD-10-CM | POA: Diagnosis not present

## 2018-12-16 DIAGNOSIS — Z170 Estrogen receptor positive status [ER+]: Secondary | ICD-10-CM | POA: Diagnosis not present

## 2018-12-16 DIAGNOSIS — R5381 Other malaise: Secondary | ICD-10-CM | POA: Diagnosis not present

## 2018-12-16 DIAGNOSIS — G629 Polyneuropathy, unspecified: Secondary | ICD-10-CM | POA: Diagnosis not present

## 2018-12-16 DIAGNOSIS — Z7689 Persons encountering health services in other specified circumstances: Secondary | ICD-10-CM | POA: Diagnosis not present

## 2018-12-16 DIAGNOSIS — E860 Dehydration: Secondary | ICD-10-CM

## 2018-12-16 DIAGNOSIS — C7949 Secondary malignant neoplasm of other parts of nervous system: Secondary | ICD-10-CM | POA: Diagnosis not present

## 2018-12-16 DIAGNOSIS — T451X5S Adverse effect of antineoplastic and immunosuppressive drugs, sequela: Secondary | ICD-10-CM | POA: Diagnosis not present

## 2018-12-16 DIAGNOSIS — C50412 Malignant neoplasm of upper-outer quadrant of left female breast: Secondary | ICD-10-CM

## 2018-12-16 DIAGNOSIS — G620 Drug-induced polyneuropathy: Secondary | ICD-10-CM | POA: Diagnosis not present

## 2018-12-16 DIAGNOSIS — C7931 Secondary malignant neoplasm of brain: Secondary | ICD-10-CM | POA: Diagnosis not present

## 2018-12-16 DIAGNOSIS — I471 Supraventricular tachycardia: Secondary | ICD-10-CM | POA: Diagnosis not present

## 2018-12-16 DIAGNOSIS — R748 Abnormal levels of other serum enzymes: Secondary | ICD-10-CM | POA: Diagnosis not present

## 2018-12-16 DIAGNOSIS — R5383 Other fatigue: Secondary | ICD-10-CM | POA: Diagnosis not present

## 2018-12-16 DIAGNOSIS — D701 Agranulocytosis secondary to cancer chemotherapy: Secondary | ICD-10-CM | POA: Diagnosis not present

## 2018-12-16 MED ORDER — FILGRASTIM 480 MCG/0.8ML IJ SOSY
480.0000 ug | PREFILLED_SYRINGE | Freq: Once | INTRAMUSCULAR | Status: AC
  Administered 2018-12-16: 480 ug via SUBCUTANEOUS
  Filled 2018-12-16: qty 0.8

## 2018-12-19 ENCOUNTER — Other Ambulatory Visit: Payer: Self-pay

## 2018-12-20 ENCOUNTER — Other Ambulatory Visit: Payer: Self-pay

## 2018-12-20 ENCOUNTER — Inpatient Hospital Stay: Payer: BLUE CROSS/BLUE SHIELD | Admitting: *Deleted

## 2018-12-20 ENCOUNTER — Inpatient Hospital Stay (HOSPITAL_BASED_OUTPATIENT_CLINIC_OR_DEPARTMENT_OTHER): Payer: BLUE CROSS/BLUE SHIELD | Admitting: Internal Medicine

## 2018-12-20 ENCOUNTER — Inpatient Hospital Stay: Payer: BLUE CROSS/BLUE SHIELD

## 2018-12-20 ENCOUNTER — Encounter: Payer: Self-pay | Admitting: Internal Medicine

## 2018-12-20 VITALS — BP 131/93 | HR 112 | Temp 97.4°F | Resp 16 | Wt 152.4 lb

## 2018-12-20 DIAGNOSIS — C50412 Malignant neoplasm of upper-outer quadrant of left female breast: Secondary | ICD-10-CM

## 2018-12-20 DIAGNOSIS — G620 Drug-induced polyneuropathy: Secondary | ICD-10-CM

## 2018-12-20 DIAGNOSIS — Z7984 Long term (current) use of oral hypoglycemic drugs: Secondary | ICD-10-CM

## 2018-12-20 DIAGNOSIS — Z7689 Persons encountering health services in other specified circumstances: Secondary | ICD-10-CM | POA: Diagnosis not present

## 2018-12-20 DIAGNOSIS — Z7902 Long term (current) use of antithrombotics/antiplatelets: Secondary | ICD-10-CM

## 2018-12-20 DIAGNOSIS — D701 Agranulocytosis secondary to cancer chemotherapy: Secondary | ICD-10-CM | POA: Diagnosis not present

## 2018-12-20 DIAGNOSIS — R748 Abnormal levels of other serum enzymes: Secondary | ICD-10-CM

## 2018-12-20 DIAGNOSIS — I10 Essential (primary) hypertension: Secondary | ICD-10-CM

## 2018-12-20 DIAGNOSIS — F17210 Nicotine dependence, cigarettes, uncomplicated: Secondary | ICD-10-CM

## 2018-12-20 DIAGNOSIS — R5381 Other malaise: Secondary | ICD-10-CM | POA: Diagnosis not present

## 2018-12-20 DIAGNOSIS — Z5111 Encounter for antineoplastic chemotherapy: Secondary | ICD-10-CM | POA: Diagnosis not present

## 2018-12-20 DIAGNOSIS — E785 Hyperlipidemia, unspecified: Secondary | ICD-10-CM

## 2018-12-20 DIAGNOSIS — Z170 Estrogen receptor positive status [ER+]: Secondary | ICD-10-CM | POA: Diagnosis not present

## 2018-12-20 DIAGNOSIS — R5383 Other fatigue: Secondary | ICD-10-CM

## 2018-12-20 DIAGNOSIS — M8580 Other specified disorders of bone density and structure, unspecified site: Secondary | ICD-10-CM | POA: Diagnosis not present

## 2018-12-20 DIAGNOSIS — G629 Polyneuropathy, unspecified: Secondary | ICD-10-CM | POA: Diagnosis not present

## 2018-12-20 DIAGNOSIS — I252 Old myocardial infarction: Secondary | ICD-10-CM

## 2018-12-20 DIAGNOSIS — I471 Supraventricular tachycardia: Secondary | ICD-10-CM | POA: Diagnosis not present

## 2018-12-20 DIAGNOSIS — I429 Cardiomyopathy, unspecified: Secondary | ICD-10-CM

## 2018-12-20 DIAGNOSIS — C7949 Secondary malignant neoplasm of other parts of nervous system: Secondary | ICD-10-CM | POA: Diagnosis not present

## 2018-12-20 DIAGNOSIS — Z79899 Other long term (current) drug therapy: Secondary | ICD-10-CM

## 2018-12-20 DIAGNOSIS — C7931 Secondary malignant neoplasm of brain: Secondary | ICD-10-CM

## 2018-12-20 DIAGNOSIS — T451X5S Adverse effect of antineoplastic and immunosuppressive drugs, sequela: Secondary | ICD-10-CM | POA: Diagnosis not present

## 2018-12-20 DIAGNOSIS — Z79818 Long term (current) use of other agents affecting estrogen receptors and estrogen levels: Secondary | ICD-10-CM

## 2018-12-20 DIAGNOSIS — Z7189 Other specified counseling: Secondary | ICD-10-CM

## 2018-12-20 DIAGNOSIS — Z86711 Personal history of pulmonary embolism: Secondary | ICD-10-CM

## 2018-12-20 DIAGNOSIS — Z95828 Presence of other vascular implants and grafts: Secondary | ICD-10-CM

## 2018-12-20 LAB — CBC WITH DIFFERENTIAL/PLATELET
Abs Immature Granulocytes: 0.18 10*3/uL — ABNORMAL HIGH (ref 0.00–0.07)
Basophils Absolute: 0 10*3/uL (ref 0.0–0.1)
Basophils Relative: 1 %
Eosinophils Absolute: 0 10*3/uL (ref 0.0–0.5)
Eosinophils Relative: 0 %
HCT: 41.3 % (ref 36.0–46.0)
HEMOGLOBIN: 13.4 g/dL (ref 12.0–15.0)
Immature Granulocytes: 4 %
LYMPHS ABS: 0.9 10*3/uL (ref 0.7–4.0)
LYMPHS PCT: 21 %
MCH: 30.7 pg (ref 26.0–34.0)
MCHC: 32.4 g/dL (ref 30.0–36.0)
MCV: 94.5 fL (ref 80.0–100.0)
Monocytes Absolute: 0.8 10*3/uL (ref 0.1–1.0)
Monocytes Relative: 20 %
Neutro Abs: 2.3 10*3/uL (ref 1.7–7.7)
Neutrophils Relative %: 54 %
Platelets: 205 10*3/uL (ref 150–400)
RBC: 4.37 MIL/uL (ref 3.87–5.11)
RDW: 17 % — ABNORMAL HIGH (ref 11.5–15.5)
WBC: 4.2 10*3/uL (ref 4.0–10.5)
nRBC: 0.7 % — ABNORMAL HIGH (ref 0.0–0.2)

## 2018-12-20 LAB — COMPREHENSIVE METABOLIC PANEL
ALT: 38 U/L (ref 0–44)
AST: 61 U/L — AB (ref 15–41)
Albumin: 3.6 g/dL (ref 3.5–5.0)
Alkaline Phosphatase: 276 U/L — ABNORMAL HIGH (ref 38–126)
Anion gap: 7 (ref 5–15)
BUN: 14 mg/dL (ref 8–23)
CO2: 27 mmol/L (ref 22–32)
Calcium: 8.9 mg/dL (ref 8.9–10.3)
Chloride: 104 mmol/L (ref 98–111)
Creatinine, Ser: 0.8 mg/dL (ref 0.44–1.00)
GFR calc Af Amer: >60 mL/min (ref >60)
GFR calc non Af Amer: >60 mL/min (ref >60)
Glucose, Bld: 86 mg/dL (ref 70–99)
POTASSIUM: 3.9 mmol/L (ref 3.5–5.1)
Sodium: 138 mmol/L (ref 135–145)
Total Bilirubin: 0.9 mg/dL (ref 0.3–1.2)
Total Protein: 7.1 g/dL (ref 6.5–8.1)

## 2018-12-20 MED ORDER — HEPARIN SOD (PORK) LOCK FLUSH 100 UNIT/ML IV SOLN
500.0000 [IU] | Freq: Once | INTRAVENOUS | Status: AC | PRN
  Administered 2018-12-20: 500 [IU]
  Filled 2018-12-20: qty 5

## 2018-12-20 MED ORDER — SODIUM CHLORIDE 0.9 % IV SOLN
1.2000 mg/m2 | Freq: Once | INTRAVENOUS | Status: AC
  Administered 2018-12-20: 2.1 mg via INTRAVENOUS
  Filled 2018-12-20: qty 4.2

## 2018-12-20 MED ORDER — SODIUM CHLORIDE 0.9 % IV SOLN
Freq: Once | INTRAVENOUS | Status: AC
  Administered 2018-12-20: 10:00:00 via INTRAVENOUS
  Filled 2018-12-20: qty 250

## 2018-12-20 MED ORDER — SODIUM CHLORIDE 0.9% FLUSH
10.0000 mL | Freq: Once | INTRAVENOUS | Status: AC
  Administered 2018-12-20: 10 mL via INTRAVENOUS
  Filled 2018-12-20: qty 10

## 2018-12-20 MED ORDER — PROCHLORPERAZINE MALEATE 10 MG PO TABS
10.0000 mg | ORAL_TABLET | Freq: Once | ORAL | Status: AC
  Administered 2018-12-20: 10 mg via ORAL
  Filled 2018-12-20: qty 1

## 2018-12-20 NOTE — Progress Notes (Signed)
[place] OFFICE PROGRESS NOTE  Patient Care Team: Steele Sizer, MD as PCP - General (Family Medicine) Wellington Hampshire, MD as PCP - Cardiology (Cardiology) Bary Castilla, Forest Gleason, MD (General Surgery) [name], MD (Inactive) as Attending Physician (Internal Medicine)  Cancer Staging No matching staging information was found for the patient.   Oncology History   # LEFT BREAST IDC; STAGE II [cT2N1] ER >90%; PR- 50-90%; her 2 NEU- POS; s/p Neoadj chemo; AUG 2016- TCH+P s/p Lumpec & partial ALND- path CR; s/p RT [finished Nov 2016];  adj Herceptin; HELD for Jan 19th 2017 [in DEC 2016-EF dropped from 63 to 51%; FEB 27th EF-42.9%]; JAN 2016 START Arimidex; May 2017- EF- 60%; May 24th 2017-Re-start Herceptin q 3W; July 28th STOP herceptin [finished 35m/m2; sec to Low EF; however 2017 Oct EF= 67%; improved]  # DEC 4th 2017- Start Neratinib 4 pills; DEC 11th 3 pills; STOPPED.  # FEB 2018- RECURRENCE ER/PR positive; Her 2 NEGATIVE [liver Bx]  # MARCH 1st 2018- Tax-Cytoxan [poor tol]  # FEB 2018- Brain mets [SBRT; Dr.Crystal; finished Feb 28th 2018]  # March 2018- Eribulin- poor tolerance  # June 8th 2018- Started Faslodex + Abema [abema-multiple interruptions sec to neutropenia]  # MRI Brain- March 2019- Leptomeningeal mets [WBRT; No LP s/p WBRT- 01/09/2018]; STOP Abema [51mday-sec to severe cytopenia]+faslodex  # MAY 20tth 019- Start Xeloda; STOPPED in Nov 22nd 2019- sec to Progressive liver lesions on CT scan  # NOV 26th 2019- Faslodex + Piqaray 25070m; Jan 2020- Piqray 300 mg+ faslodex; FEB 17th CT scan- mixed response/ Overall progression; STOP Piqray + faslodex.   # FEB 25th 2020- ERIBULIN.   # PN- G-2; may 2017- Cymbalta 62m17mMARCH 2018-  acute vascular insuff of BIL LE [? Taxotere vasoconstriction on asprin]; # hemorragic shock LGIB [s/p colo; Dr.Wohl]  #  SVT- on flecainide.   # April 2016- Liver Bx- NEG;   # Drop in EF from Herceptin [recovered  OCt 27th- EF 65%]  # BMD- jan 2017- osteopenia  -----------------------------------------------------------     [place]LE[place]- Sep 4th 2018- PDL-1 0%/NEG for BRCA; MSI-Stable; Positive for PI3K; ccnd-1; FGF** -----------------------------------------------------------  Dx: Metastatic Breast cancer [ER/PR-Pos; her -2 NEG] Stage IV; goals: palliative Current treatment- ERIBULIN.      Carcinoma of upper-outer quadrant of left breast in female, estrogen receptor positive (HCC)[place]2/25/2020 -  Chemotherapy    The patient had eriBULin mesylate (HALAVEN) 2.1 mg in sodium chloride 0.9 % 100 mL chemo infusion, 1.2 mg/m2 = 2.1 mg (100 % of original dose 1.2 mg/m2), Intravenous,  Once, 2 of 4 cycles Dose modification: 1.2 mg/m2 (original dose 1.2 mg/m2, Cycle 1, Reason: Provider Judgment) Administration: 2.1 mg (11/22/2018), 2.1 mg (11/29/2018), 2.1 mg (12/20/2018)  for chemotherapy treatment.      INTERVAL HISTORY:  Sarah [id number].  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  Patient did not get her cycle number 2-day 1 eribulin last week because of neutropenia.  Instead patient received Neupogen x4 days last week.  No fever chills.  No nausea no vomiting.  Chronic tingling and numbness in extremities.  Review of Systems  Constitutional: Positive for malaise/fatigue. Negative for chills, diaphoresis, fever and weight loss.  HENT: Negative for nosebleeds and sore throat.   Eyes: Negative for double vision.  Respiratory: Negative for cough, hemoptysis, sputum production, shortness of breath and wheezing.  Cardiovascular: Negative for chest pain, palpitations, orthopnea and leg swelling.  Gastrointestinal: Negative for abdominal pain, blood in stool, constipation, heartburn, melena, nausea and vomiting.  Genitourinary: Negative for dysuria,  frequency and urgency.  Musculoskeletal: Positive for joint pain. Negative for back pain.  Skin: Negative for itching.  Neurological: Positive for tingling. Negative for dizziness, focal weakness, weakness and headaches.  Endo/Heme/Allergies: Does not bruise/bleed easily.  Psychiatric/Behavioral: Negative for depression. The patient is not nervous/anxious and does not have insomnia.       PAST MEDICAL HISTORY :  Past Medical History:  Diagnosis Date  . Chemotherapy-induced peripheral neuropathy ([place])   . Epistaxis    a. 11/2016 in setting of asa/plavix-->silver nitrate cauterization.  . GI bleed    a. 11/2016 Admission w/ GIB and hypovolemic shock req 3u PRBC's;  b. 11/2016 ECG: gastritis & nonbleeding peptic ulcer; c. 11/2016 Conlonoscopy: rectal and sigmoid colonic ulcers.  . Heart attack ([place])    a. 1998 Cath @ [facility]: reportedly no intervention required.  Marland Kitchen Herceptin-induced cardiomyopathy ([place])    a. In the setting of Herceptin Rx for breast cancer (initiated 12/2014); b. 03/2015 MUGA EF 64%; b. 08/2015 MUGA: EF 51%; c. 10/2015 MUGA: EF 44%; d. 11/2015 Echo: EF 45-50%; e. 01/2016 MUGA: EF 60%; f. 06/2016 MUGA EF 65%; g. 10/2016 MUGA: EF 61%;  h. 12/2016 Echo: EF 55-60%, gr1 DD.  Marland Kitchen Hyperlipidemia   . Hypertension   . Neuropathy   . Possible PAD (peripheral artery disease) ([place])    a. 11/2016 LE cyanosis and weak pulses-->CTA w/o significant Ao-BiFem dzs. ? distal dzs-->ASA/Plavix initiated by vascular surgery.  Marland Kitchen PSVT (paroxysmal supraventricular tachycardia) ([place])    a. Dx 11/2016.  . Pulmonary embolism ([place])    a. 12/2016 CTA Chest: small nonocclusive PE in inferior segment of the Left lingula, somewhat eccentric filling defect suggesting chronic rather than acute embolic event; b. 12/1738 LE U/S:  No DVT; c. 12/2016 Echo: Nl RV fxn, nl PASP.  Marland Kitchen Recurrent Metastatic breast cancer ([place])    a. Dx 2016: Stage II, ER positive, PR positive, HER-2/neu overexpressing of the left breast-->chemo/radiation;  b. 10/2016 CT Abd/pelvis: diffuse liver mets, ill defined sclerotic bone lesions-T12;  c. 10/2016 MRI brain: metastatic lesion along L temporal lobe (19x68m) w/ extensive surrounding edema & 578mmidline shift to right.  . Sinus tachycardia     PAST SURGICAL HISTORY :   Past Surgical History:  Procedure Laterality Date  . BREAST BIOPSY Left 2016   Positive  . BREAST LUMPECTOMY WITH SENTINEL LYMPH NODE BIOPSY Left 05/23/2015   Procedure: LEFT BREAST WIDE EXCISION WITH AXILLARY DISSECTION, MASTOPLASTY ;  Surgeon: JeRobert BellowMD;  Location: [facility] ORS;  Service: General;  Laterality: Left;  . BREAST SURGERY Left 12/18/14   breast biopsy/INVASIVE DUCTAL CARCINOMA OF BREAST, NOTTINGHAM GRADE 2.  . Marland KitchenREAST SURGERY  05/23/2015.   Wide excision/mastoplasty, axillary dissection. No residual invasive cancer, positive for residual DCIS. 0/2 nodes identified on axillary dissection. (no SLN by technetium or methylene blue)  . CARDIAC CATHETERIZATION    . COLONOSCOPY WITH PROPOFOL N/A 12/10/2016   Procedure: COLONOSCOPY WITH PROPOFOL;  Surgeon: DaLucilla LameMD;  Location: [facility] ENDOSCOPY;  Service: Endoscopy;  Laterality: N/A;  . COLONOSCOPY WITH PROPOFOL N/A 02/13/2017   Procedure: COLONOSCOPY WITH PROPOFOL;  Surgeon: WoLucilla LameMD;  [facility];  Service: Endoscopy;  Laterality: N/A;  . ESOPHAGOGASTRODUODENOSCOPY (EGD) WITH PROPOFOL N/A 12/08/2016   Procedure: ESOPHAGOGASTRODUODENOSCOPY (EGD) WITH PROPOFOL;  Surgeon: DaLucilla LameMD;  Location: [facility] ENDOSCOPY;  Service: Endoscopy;  Laterality: N/A;  . IVC FILTER INSERTION N/A 02/15/2017   Procedure: IVC Filter Insertion;  Surgeon: Algernon Huxley, MD;  Location: [place] CV LAB;  Service: Cardiovascular;  Laterality: N/A;  . PORTACATH PLACEMENT Right 12-31-14   Dr Bary Castilla    FAMILY HISTORY :   Family History  Problem Relation Age of Onset  . Breast cancer Maternal Aunt   . Breast cancer Cousin   . Brain cancer Maternal Uncle   .  Diabetes Mother   . Hypertension Mother   . Stroke Mother     SOCIAL HISTORY:   Social History   Tobacco Use  . Smoking status: Light Tobacco Smoker    Packs/day: 0.50    Years: 18.00    Pack years: 9.00    Types: Cigarettes  . Smokeless tobacco: Never Used  Substance Use Topics  . Alcohol use: No    Alcohol/week: 0.0 standard drinks  . Drug use: No    ALLERGIES:  is allergic to no known allergies.  MEDICATIONS:  Current Outpatient Medications  Medication Sig Dispense Refill  . alpelisib (PIQRAY 300MG DAILY DOSE) 2 x 150 MG Therapy Pack Take 2 tablets (373m total) by mouth once daily with food. 60 each 4  . flecainide (TAMBOCOR) 50 MG tablet Take 1 tablet (50 mg total) by mouth 2 (two) times daily. 180 tablet 3  . levocetirizine (XYZAL) 5 MG tablet Take 1 tablet (5 mg total) by mouth every evening. 30 tablet 3  . magnesium oxide (MAG-OX) 400 MG tablet TAKE 1 TABLET BY MOUTH TWICE A DAY 60 tablet 6  . metFORMIN (GLUCOPHAGE) 500 MG tablet Take 1 tablet (500 mg total) by mouth 2 (two) times daily with a meal. 60 tablet 4  . pregabalin (LYRICA) 75 MG capsule Take 1 capsule (75 mg total) by mouth 2 (two) times daily. 180 capsule 0  . triamcinolone ointment (KENALOG) 0.5 % Apply 1 application topically 2 (two) times daily. 30 g 2  . Vitamin D, Ergocalciferol, (DRISDOL) 50000 units CAPS capsule TAKE 1 CAPSULE (50,000 UNITS TOTAL) ONCE A WEEK BY MOUTH. 12 capsule 1   No current facility-administered medications for this visit.    Facility-Administered Medications Ordered in Other Visits  Medication Dose Route Frequency Provider Last Rate Last Dose  . 0.9 %  sodium chloride infusion   Intravenous Continuous Pandit, Sandeep, MD      . 0.9 %  sodium chloride infusion   Intravenous Continuous FLloyd Huger MD 999 mL/hr at 04/24/15 1520    . heparin lock flush 100 unit/mL  500 Units Intravenous Once Berenzon, Dmitriy, MD      . sodium chloride 0.9 % injection 10 mL  10 mL  Intravenous PRN PLeia Alf MD   10 mL at 04/04/15 1440  . sodium chloride flush (NS) 0.9 % injection 10 mL  10 mL Intravenous PRN Berenzon, Dmitriy, MD      . Tbo-Filgrastim (GRANIX) injection 480 mcg  480 mcg Subcutaneous Once BCammie Sickle MD        PHYSICAL EXAMINATION: ECOG PERFORMANCE STATUS: 1 - Symptomatic but completely ambulatory  BP (!) 131/93 (BP Location: Left Arm, Patient Position: Sitting, Cuff Size: Normal)   Pulse (!) 112   Temp (!) 97.4 F (36.3 C) (Tympanic)   Resp 16   Wt 152 lb 6.4 oz (69.1 kg)   BMI 24.60 kg/m   Filed Weights   12/20/18 0927  Weight: 152 lb 6.4 oz (  69.1 kg)    Physical Exam  Constitutional: She is oriented to person, place, and time and well-developed, well-nourished, and in no distress.  Alone.  Walking herself.  HENT:  Head: Normocephalic and atraumatic.  Mouth/Throat: Oropharynx is clear and moist. No oropharyngeal exudate.  Eyes: Pupils are equal, round, and reactive to light.  Neck: Normal range of motion. Neck supple.  Cardiovascular: Normal rate and regular rhythm.  Pulmonary/Chest: No respiratory distress. She has no wheezes.  Abdominal: Soft. Bowel sounds are normal. She exhibits no distension and no mass. There is no abdominal tenderness. There is no rebound and no guarding.  Musculoskeletal: Normal range of motion.        General: No tenderness or edema.  Neurological: She is alert and oriented to person, place, and time.  Skin: Skin is warm.  hyperpigmentation of the bilateral hand and feet (better)  Psychiatric: Affect normal.       LABORATORY DATA:  I have reviewed the data as listed    Component Value Date/Time   NA 138 12/20/2018 0900   NA 135 03/04/2017 0918   NA 135 01/08/2015 0850   K 3.9 12/20/2018 0900   K 3.7 01/08/2015 0850   CL 104 12/20/2018 0900   CL 101 01/08/2015 0850   CO2 27 12/20/2018 0900   CO2 26 01/08/2015 0850   GLUCOSE 86 12/20/2018 0900   GLUCOSE 161 (H) 01/08/2015 0850    BUN 14 12/20/2018 0900   BUN 8 03/04/2017 0918   BUN 17 01/08/2015 0850   CREATININE 0.80 12/20/2018 0900   CREATININE 0.82 01/22/2015 1559   CALCIUM 8.9 12/20/2018 0900   CALCIUM 9.3 01/08/2015 0850   PROT 7.1 12/20/2018 0900   PROT 6.4 03/04/2017 0918   PROT 6.8 01/22/2015 1559   ALBUMIN 3.6 12/20/2018 0900   ALBUMIN 2.5 (L) 03/04/2017 0918   ALBUMIN 3.9 01/22/2015 1559   AST 61 (H) 12/20/2018 0900   AST 34 01/22/2015 1559   ALT 38 12/20/2018 0900   ALT 42 01/22/2015 1559   ALKPHOS 276 (H) 12/20/2018 0900   ALKPHOS 157 (H) 01/22/2015 1559   BILITOT 0.9 12/20/2018 0900   BILITOT 6.2 (H) 03/04/2017 0918   BILITOT 0.2 (L) 01/22/2015 1559   GFRNONAA >60 12/20/2018 0900   GFRNONAA >60 01/22/2015 1559   GFRAA >60 12/20/2018 0900   GFRAA >60 01/22/2015 1559    No results found for: SPEP, UPEP  Lab Results  Component Value Date   WBC 4.2 12/20/2018   NEUTROABS 2.3 12/20/2018   HGB 13.4 12/20/2018   HCT 41.3 12/20/2018   MCV 94.5 12/20/2018   PLT 205 12/20/2018      Chemistry      Component Value Date/Time   NA 138 12/20/2018 0900   NA 135 03/04/2017 0918   NA 135 01/08/2015 0850   K 3.9 12/20/2018 0900   K 3.7 01/08/2015 0850   CL 104 12/20/2018 0900   CL 101 01/08/2015 0850   CO2 27 12/20/2018 0900   CO2 26 01/08/2015 0850   BUN 14 12/20/2018 0900   BUN 8 03/04/2017 0918   BUN 17 01/08/2015 0850   CREATININE 0.80 12/20/2018 0900   CREATININE 0.82 01/22/2015 1559      Component Value Date/Time   CALCIUM 8.9 12/20/2018 0900   CALCIUM 9.3 01/08/2015 0850   ALKPHOS 276 (H) 12/20/2018 0900   ALKPHOS 157 (H) 01/22/2015 1559   AST 61 (H) 12/20/2018 0900   AST 34 01/22/2015 1559   ALT  38 12/20/2018 0900   ALT 42 01/22/2015 1559   BILITOT 0.9 12/20/2018 0900   BILITOT 6.2 (H) 03/04/2017 0918   BILITOT 0.2 (L) 01/22/2015 1559       RADIOGRAPHIC STUDIES: I have personally reviewed the radiological images as listed and agreed with the findings in the  report. No results found.   ASSESSMENT & PLAN:  Carcinoma of upper-outer quadrant of left breast in female, estrogen receptor positive ([place]) # Recurrent metastatic breast cancer-ER PR positive HER-2 negative; FEB 17th CT- mixed response in liver [few lesion smaller few larger]. Currently on Eribulin [started on 2/25]  # proceed with cycle number 2-day 1 Labs today reviewed;  acceptable for treatment today.  We will plan to repeat imaging late April/May 2020.  #Neutropenia- improved; continue neupogen prohylaxis.   #Elevated alkaline phosphatase- 2 ? Cancer vs medications.  Worsening.  Monitor for now.   # Brain metastases-recurrent/likely leptomeningeal disease;  S/p WBRT [April 2019]. NOv 19th MRI stable-no progressive leptomeningeal disease. STABLE.   #Peripheral neuropathy grade 1-  on Lyrica;STABLE.   # DISPOSITION: # Eribulin today # neupogen daily x 3 days- starting tomorrow- thru 3/20 # 1 week-labs-cbc/bmp- Eribulin;next day-neupogen daily x 3 days- # follow up in 3 weeks- MD-cbc/cmp/ca-27-29; cea; ca-15-3; Eribulin;next day-neupogen daily x 3 days--Dr.B    Orders Placed This Encounter  Procedures  . CBC with Differential    Standing Status:   Future    Standing Expiration Date:   12/20/2019  . Basic metabolic panel    Standing Status:   Future    Standing Expiration Date:   12/20/2019  . CBC with Differential    Standing Status:   Future    Standing Expiration Date:   12/20/2019  . Comprehensive metabolic panel    Standing Status:   Future    Standing Expiration Date:   12/20/2019  . Cancer antigen 15-3    Standing Status:   Future    Standing Expiration Date:   12/20/2019  . CEA    Standing Status:   Future    Standing Expiration Date:   12/20/2019  . Cancer antigen 27.29    Standing Status:   Future    Standing Expiration Date:   12/20/2019   All questions were answered. The patient knows to call the clinic with any problems, questions or concerns.      Cammie Sickle, MD 12/20/2018 1:06 PM

## 2018-12-20 NOTE — Assessment & Plan Note (Addendum)
#  Recurrent metastatic breast cancer-ER PR positive HER-2 negative; FEB 17th CT- mixed response in liver [few lesion smaller few larger]. Currently on Eribulin [started on 2/25]  # proceed with cycle number 2-day 1 Labs today reviewed;  acceptable for treatment today.  We will plan to repeat imaging late April/May 2020.  #Neutropenia- improved; continue neupogen prohylaxis.   #Elevated alkaline phosphatase- 2 ? Cancer vs medications.  Worsening.  Monitor for now.   # Brain metastases-recurrent/likely leptomeningeal disease;  S/p WBRT [April 2019]. NOv 19th MRI stable-no progressive leptomeningeal disease. STABLE.   #Peripheral neuropathy grade 1-  on Lyrica;STABLE.   # Educated the patient regarding novel coronavirus-modes of transmission/risks; and measures to avoid infection.    # DISPOSITION: # Eribulin today # neupogen daily x 3 days- starting tomorrow- thru 3/20 # 1 week-labs-cbc/bmp- Eribulin;next day-neupogen daily x 3 days- # follow up in 3 weeks- MD-cbc/cmp/ca-27-29; cea; ca-15-3; Eribulin;next day-neupogen daily x 3 days--Dr.B

## 2018-12-21 ENCOUNTER — Inpatient Hospital Stay: Payer: BLUE CROSS/BLUE SHIELD

## 2018-12-21 ENCOUNTER — Other Ambulatory Visit: Payer: Self-pay

## 2018-12-21 DIAGNOSIS — Z7689 Persons encountering health services in other specified circumstances: Secondary | ICD-10-CM | POA: Diagnosis not present

## 2018-12-21 DIAGNOSIS — Z170 Estrogen receptor positive status [ER+]: Secondary | ICD-10-CM

## 2018-12-21 DIAGNOSIS — R748 Abnormal levels of other serum enzymes: Secondary | ICD-10-CM | POA: Diagnosis not present

## 2018-12-21 DIAGNOSIS — I471 Supraventricular tachycardia: Secondary | ICD-10-CM | POA: Diagnosis not present

## 2018-12-21 DIAGNOSIS — Z5111 Encounter for antineoplastic chemotherapy: Secondary | ICD-10-CM | POA: Diagnosis not present

## 2018-12-21 DIAGNOSIS — T451X5S Adverse effect of antineoplastic and immunosuppressive drugs, sequela: Secondary | ICD-10-CM | POA: Diagnosis not present

## 2018-12-21 DIAGNOSIS — C7931 Secondary malignant neoplasm of brain: Secondary | ICD-10-CM | POA: Diagnosis not present

## 2018-12-21 DIAGNOSIS — G620 Drug-induced polyneuropathy: Secondary | ICD-10-CM | POA: Diagnosis not present

## 2018-12-21 DIAGNOSIS — E860 Dehydration: Secondary | ICD-10-CM

## 2018-12-21 DIAGNOSIS — C50412 Malignant neoplasm of upper-outer quadrant of left female breast: Secondary | ICD-10-CM | POA: Diagnosis not present

## 2018-12-21 DIAGNOSIS — G629 Polyneuropathy, unspecified: Secondary | ICD-10-CM | POA: Diagnosis not present

## 2018-12-21 DIAGNOSIS — Z79818 Long term (current) use of other agents affecting estrogen receptors and estrogen levels: Secondary | ICD-10-CM | POA: Diagnosis not present

## 2018-12-21 DIAGNOSIS — R5383 Other fatigue: Secondary | ICD-10-CM | POA: Diagnosis not present

## 2018-12-21 DIAGNOSIS — M8580 Other specified disorders of bone density and structure, unspecified site: Secondary | ICD-10-CM | POA: Diagnosis not present

## 2018-12-21 DIAGNOSIS — D701 Agranulocytosis secondary to cancer chemotherapy: Secondary | ICD-10-CM | POA: Diagnosis not present

## 2018-12-21 DIAGNOSIS — R5381 Other malaise: Secondary | ICD-10-CM | POA: Diagnosis not present

## 2018-12-21 DIAGNOSIS — C7949 Secondary malignant neoplasm of other parts of nervous system: Secondary | ICD-10-CM | POA: Diagnosis not present

## 2018-12-21 MED ORDER — FILGRASTIM 480 MCG/0.8ML IJ SOSY
480.0000 ug | PREFILLED_SYRINGE | Freq: Once | INTRAMUSCULAR | Status: AC
  Administered 2018-12-21: 480 ug via SUBCUTANEOUS
  Filled 2018-12-21: qty 0.8

## 2018-12-22 ENCOUNTER — Other Ambulatory Visit: Payer: Self-pay

## 2018-12-22 ENCOUNTER — Inpatient Hospital Stay: Payer: BLUE CROSS/BLUE SHIELD

## 2018-12-22 DIAGNOSIS — E860 Dehydration: Secondary | ICD-10-CM

## 2018-12-22 DIAGNOSIS — T451X5S Adverse effect of antineoplastic and immunosuppressive drugs, sequela: Secondary | ICD-10-CM | POA: Diagnosis not present

## 2018-12-22 DIAGNOSIS — I471 Supraventricular tachycardia: Secondary | ICD-10-CM | POA: Diagnosis not present

## 2018-12-22 DIAGNOSIS — C50412 Malignant neoplasm of upper-outer quadrant of left female breast: Secondary | ICD-10-CM

## 2018-12-22 DIAGNOSIS — G629 Polyneuropathy, unspecified: Secondary | ICD-10-CM | POA: Diagnosis not present

## 2018-12-22 DIAGNOSIS — R748 Abnormal levels of other serum enzymes: Secondary | ICD-10-CM | POA: Diagnosis not present

## 2018-12-22 DIAGNOSIS — Z79818 Long term (current) use of other agents affecting estrogen receptors and estrogen levels: Secondary | ICD-10-CM | POA: Diagnosis not present

## 2018-12-22 DIAGNOSIS — C7931 Secondary malignant neoplasm of brain: Secondary | ICD-10-CM | POA: Diagnosis not present

## 2018-12-22 DIAGNOSIS — R5383 Other fatigue: Secondary | ICD-10-CM | POA: Diagnosis not present

## 2018-12-22 DIAGNOSIS — D701 Agranulocytosis secondary to cancer chemotherapy: Secondary | ICD-10-CM | POA: Diagnosis not present

## 2018-12-22 DIAGNOSIS — Z170 Estrogen receptor positive status [ER+]: Secondary | ICD-10-CM

## 2018-12-22 DIAGNOSIS — Z5111 Encounter for antineoplastic chemotherapy: Secondary | ICD-10-CM | POA: Diagnosis not present

## 2018-12-22 DIAGNOSIS — G620 Drug-induced polyneuropathy: Secondary | ICD-10-CM | POA: Diagnosis not present

## 2018-12-22 DIAGNOSIS — C7949 Secondary malignant neoplasm of other parts of nervous system: Secondary | ICD-10-CM | POA: Diagnosis not present

## 2018-12-22 DIAGNOSIS — R5381 Other malaise: Secondary | ICD-10-CM | POA: Diagnosis not present

## 2018-12-22 DIAGNOSIS — M8580 Other specified disorders of bone density and structure, unspecified site: Secondary | ICD-10-CM | POA: Diagnosis not present

## 2018-12-22 DIAGNOSIS — Z7689 Persons encountering health services in other specified circumstances: Secondary | ICD-10-CM | POA: Diagnosis not present

## 2018-12-22 MED ORDER — FILGRASTIM 480 MCG/0.8ML IJ SOSY
480.0000 ug | PREFILLED_SYRINGE | Freq: Once | INTRAMUSCULAR | Status: AC
  Administered 2018-12-22: 480 ug via SUBCUTANEOUS
  Filled 2018-12-22: qty 0.8

## 2018-12-23 ENCOUNTER — Inpatient Hospital Stay: Payer: BLUE CROSS/BLUE SHIELD

## 2018-12-23 ENCOUNTER — Other Ambulatory Visit: Payer: Self-pay

## 2018-12-23 DIAGNOSIS — C50412 Malignant neoplasm of upper-outer quadrant of left female breast: Secondary | ICD-10-CM | POA: Diagnosis not present

## 2018-12-23 DIAGNOSIS — Z170 Estrogen receptor positive status [ER+]: Secondary | ICD-10-CM | POA: Diagnosis not present

## 2018-12-23 DIAGNOSIS — G620 Drug-induced polyneuropathy: Secondary | ICD-10-CM | POA: Diagnosis not present

## 2018-12-23 DIAGNOSIS — C7949 Secondary malignant neoplasm of other parts of nervous system: Secondary | ICD-10-CM | POA: Diagnosis not present

## 2018-12-23 DIAGNOSIS — R748 Abnormal levels of other serum enzymes: Secondary | ICD-10-CM | POA: Diagnosis not present

## 2018-12-23 DIAGNOSIS — D701 Agranulocytosis secondary to cancer chemotherapy: Secondary | ICD-10-CM | POA: Diagnosis not present

## 2018-12-23 DIAGNOSIS — R5383 Other fatigue: Secondary | ICD-10-CM | POA: Diagnosis not present

## 2018-12-23 DIAGNOSIS — Z5111 Encounter for antineoplastic chemotherapy: Secondary | ICD-10-CM | POA: Diagnosis not present

## 2018-12-23 DIAGNOSIS — Z79818 Long term (current) use of other agents affecting estrogen receptors and estrogen levels: Secondary | ICD-10-CM | POA: Diagnosis not present

## 2018-12-23 DIAGNOSIS — I471 Supraventricular tachycardia: Secondary | ICD-10-CM | POA: Diagnosis not present

## 2018-12-23 DIAGNOSIS — E860 Dehydration: Secondary | ICD-10-CM

## 2018-12-23 DIAGNOSIS — T451X5S Adverse effect of antineoplastic and immunosuppressive drugs, sequela: Secondary | ICD-10-CM | POA: Diagnosis not present

## 2018-12-23 DIAGNOSIS — R5381 Other malaise: Secondary | ICD-10-CM | POA: Diagnosis not present

## 2018-12-23 DIAGNOSIS — M8580 Other specified disorders of bone density and structure, unspecified site: Secondary | ICD-10-CM | POA: Diagnosis not present

## 2018-12-23 DIAGNOSIS — G629 Polyneuropathy, unspecified: Secondary | ICD-10-CM | POA: Diagnosis not present

## 2018-12-23 DIAGNOSIS — C7931 Secondary malignant neoplasm of brain: Secondary | ICD-10-CM | POA: Diagnosis not present

## 2018-12-23 DIAGNOSIS — Z7689 Persons encountering health services in other specified circumstances: Secondary | ICD-10-CM | POA: Diagnosis not present

## 2018-12-23 MED ORDER — FILGRASTIM 480 MCG/0.8ML IJ SOSY
480.0000 ug | PREFILLED_SYRINGE | Freq: Once | INTRAMUSCULAR | Status: AC
  Administered 2018-12-23: 480 ug via SUBCUTANEOUS
  Filled 2018-12-23: qty 0.8

## 2018-12-26 ENCOUNTER — Other Ambulatory Visit: Payer: Self-pay

## 2018-12-27 ENCOUNTER — Other Ambulatory Visit: Payer: Self-pay

## 2018-12-27 ENCOUNTER — Other Ambulatory Visit: Payer: Self-pay | Admitting: Internal Medicine

## 2018-12-27 ENCOUNTER — Inpatient Hospital Stay: Payer: BLUE CROSS/BLUE SHIELD

## 2018-12-27 ENCOUNTER — Telehealth: Payer: Self-pay | Admitting: Internal Medicine

## 2018-12-27 VITALS — BP 125/90 | HR 81 | Temp 97.0°F | Resp 20 | Wt 151.0 lb

## 2018-12-27 DIAGNOSIS — Z79818 Long term (current) use of other agents affecting estrogen receptors and estrogen levels: Secondary | ICD-10-CM | POA: Diagnosis not present

## 2018-12-27 DIAGNOSIS — Z7689 Persons encountering health services in other specified circumstances: Secondary | ICD-10-CM | POA: Diagnosis not present

## 2018-12-27 DIAGNOSIS — T451X5S Adverse effect of antineoplastic and immunosuppressive drugs, sequela: Secondary | ICD-10-CM | POA: Diagnosis not present

## 2018-12-27 DIAGNOSIS — Z5111 Encounter for antineoplastic chemotherapy: Secondary | ICD-10-CM | POA: Diagnosis not present

## 2018-12-27 DIAGNOSIS — R5383 Other fatigue: Secondary | ICD-10-CM | POA: Diagnosis not present

## 2018-12-27 DIAGNOSIS — I471 Supraventricular tachycardia: Secondary | ICD-10-CM | POA: Diagnosis not present

## 2018-12-27 DIAGNOSIS — E860 Dehydration: Secondary | ICD-10-CM

## 2018-12-27 DIAGNOSIS — Z170 Estrogen receptor positive status [ER+]: Secondary | ICD-10-CM | POA: Diagnosis not present

## 2018-12-27 DIAGNOSIS — G620 Drug-induced polyneuropathy: Secondary | ICD-10-CM | POA: Diagnosis not present

## 2018-12-27 DIAGNOSIS — C7931 Secondary malignant neoplasm of brain: Secondary | ICD-10-CM | POA: Diagnosis not present

## 2018-12-27 DIAGNOSIS — D701 Agranulocytosis secondary to cancer chemotherapy: Secondary | ICD-10-CM | POA: Diagnosis not present

## 2018-12-27 DIAGNOSIS — C50412 Malignant neoplasm of upper-outer quadrant of left female breast: Secondary | ICD-10-CM

## 2018-12-27 DIAGNOSIS — R5381 Other malaise: Secondary | ICD-10-CM | POA: Diagnosis not present

## 2018-12-27 DIAGNOSIS — C7949 Secondary malignant neoplasm of other parts of nervous system: Secondary | ICD-10-CM | POA: Diagnosis not present

## 2018-12-27 DIAGNOSIS — R748 Abnormal levels of other serum enzymes: Secondary | ICD-10-CM | POA: Diagnosis not present

## 2018-12-27 DIAGNOSIS — M8580 Other specified disorders of bone density and structure, unspecified site: Secondary | ICD-10-CM | POA: Diagnosis not present

## 2018-12-27 DIAGNOSIS — G629 Polyneuropathy, unspecified: Secondary | ICD-10-CM | POA: Diagnosis not present

## 2018-12-27 LAB — BASIC METABOLIC PANEL
Anion gap: 9 (ref 5–15)
BUN: 13 mg/dL (ref 8–23)
CO2: 28 mmol/L (ref 22–32)
Calcium: 9 mg/dL (ref 8.9–10.3)
Chloride: 104 mmol/L (ref 98–111)
Creatinine, Ser: 0.79 mg/dL (ref 0.44–1.00)
GFR calc Af Amer: >60 mL/min (ref >60)
GFR calc non Af Amer: >60 mL/min (ref >60)
Glucose, Bld: 89 mg/dL (ref 70–99)
Potassium: 3.5 mmol/L (ref 3.5–5.1)
Sodium: 141 mmol/L (ref 135–145)

## 2018-12-27 LAB — CBC WITH DIFFERENTIAL/PLATELET
Abs Immature Granulocytes: 0.01 10*3/uL (ref 0.00–0.07)
Basophils Absolute: 0 10*3/uL (ref 0.0–0.1)
Basophils Relative: 1 %
Eosinophils Absolute: 0 10*3/uL (ref 0.0–0.5)
Eosinophils Relative: 1 %
HCT: 37.6 % (ref 36.0–46.0)
Hemoglobin: 12.3 g/dL (ref 12.0–15.0)
Immature Granulocytes: 1 %
LYMPHS ABS: 0.6 10*3/uL — AB (ref 0.7–4.0)
Lymphocytes Relative: 56 %
MCH: 30.4 pg (ref 26.0–34.0)
MCHC: 32.7 g/dL (ref 30.0–36.0)
MCV: 92.8 fL (ref 80.0–100.0)
MONO ABS: 0.2 10*3/uL (ref 0.1–1.0)
Monocytes Relative: 16 %
Neutro Abs: 0.3 10*3/uL — ABNORMAL LOW (ref 1.7–7.7)
Neutrophils Relative %: 25 %
Platelets: 140 10*3/uL — ABNORMAL LOW (ref 150–400)
RBC: 4.05 MIL/uL (ref 3.87–5.11)
RDW: 16.4 % — ABNORMAL HIGH (ref 11.5–15.5)
WBC Morphology: ABNORMAL
WBC: 1.1 10*3/uL — CL (ref 4.0–10.5)
nRBC: 0 % (ref 0.0–0.2)

## 2018-12-27 MED ORDER — FILGRASTIM 480 MCG/0.8ML IJ SOSY
480.0000 ug | PREFILLED_SYRINGE | Freq: Once | INTRAMUSCULAR | Status: AC
  Administered 2018-12-27: 480 ug via SUBCUTANEOUS
  Filled 2018-12-27: qty 0.8

## 2018-12-27 MED ORDER — FILGRASTIM 480 MCG/0.8ML IJ SOSY
480.0000 ug | PREFILLED_SYRINGE | Freq: Once | INTRAMUSCULAR | Status: DC
  Filled 2018-12-27: qty 0.8

## 2018-12-27 MED ORDER — SODIUM CHLORIDE 0.9% FLUSH
10.0000 mL | Freq: Once | INTRAVENOUS | Status: AC
  Administered 2018-12-27: 10 mL via INTRAVENOUS
  Filled 2018-12-27: qty 10

## 2018-12-27 MED ORDER — HEPARIN SOD (PORK) LOCK FLUSH 100 UNIT/ML IV SOLN
500.0000 [IU] | Freq: Once | INTRAVENOUS | Status: AC
  Administered 2018-12-27: 500 [IU] via INTRAVENOUS

## 2018-12-27 NOTE — Telephone Encounter (Signed)
NO eribulin today- proceed with neuporgen today/ and daily until this friday- april 3rd- Dr.B

## 2018-12-28 ENCOUNTER — Inpatient Hospital Stay: Payer: BLUE CROSS/BLUE SHIELD | Attending: Internal Medicine

## 2018-12-28 ENCOUNTER — Other Ambulatory Visit: Payer: Self-pay

## 2018-12-28 DIAGNOSIS — C50412 Malignant neoplasm of upper-outer quadrant of left female breast: Secondary | ICD-10-CM | POA: Insufficient documentation

## 2018-12-28 DIAGNOSIS — Z170 Estrogen receptor positive status [ER+]: Secondary | ICD-10-CM

## 2018-12-28 DIAGNOSIS — C7949 Secondary malignant neoplasm of other parts of nervous system: Secondary | ICD-10-CM | POA: Insufficient documentation

## 2018-12-28 DIAGNOSIS — C7931 Secondary malignant neoplasm of brain: Secondary | ICD-10-CM | POA: Insufficient documentation

## 2018-12-28 DIAGNOSIS — T451X5S Adverse effect of antineoplastic and immunosuppressive drugs, sequela: Secondary | ICD-10-CM | POA: Insufficient documentation

## 2018-12-28 DIAGNOSIS — D701 Agranulocytosis secondary to cancer chemotherapy: Secondary | ICD-10-CM | POA: Insufficient documentation

## 2018-12-28 DIAGNOSIS — E860 Dehydration: Secondary | ICD-10-CM

## 2018-12-28 MED ORDER — FILGRASTIM 480 MCG/0.8ML IJ SOSY
480.0000 ug | PREFILLED_SYRINGE | Freq: Once | INTRAMUSCULAR | Status: AC
  Administered 2018-12-28: 10:00:00 480 ug via SUBCUTANEOUS
  Filled 2018-12-28: qty 0.8

## 2018-12-29 ENCOUNTER — Inpatient Hospital Stay: Payer: Medicare HMO | Attending: Internal Medicine

## 2018-12-29 ENCOUNTER — Other Ambulatory Visit: Payer: Self-pay

## 2018-12-29 DIAGNOSIS — C7931 Secondary malignant neoplasm of brain: Secondary | ICD-10-CM | POA: Diagnosis not present

## 2018-12-29 DIAGNOSIS — I252 Old myocardial infarction: Secondary | ICD-10-CM | POA: Insufficient documentation

## 2018-12-29 DIAGNOSIS — E860 Dehydration: Secondary | ICD-10-CM

## 2018-12-29 DIAGNOSIS — I429 Cardiomyopathy, unspecified: Secondary | ICD-10-CM | POA: Insufficient documentation

## 2018-12-29 DIAGNOSIS — Z170 Estrogen receptor positive status [ER+]: Secondary | ICD-10-CM | POA: Diagnosis not present

## 2018-12-29 DIAGNOSIS — D709 Neutropenia, unspecified: Secondary | ICD-10-CM | POA: Insufficient documentation

## 2018-12-29 DIAGNOSIS — C787 Secondary malignant neoplasm of liver and intrahepatic bile duct: Secondary | ICD-10-CM | POA: Diagnosis not present

## 2018-12-29 DIAGNOSIS — M8580 Other specified disorders of bone density and structure, unspecified site: Secondary | ICD-10-CM | POA: Insufficient documentation

## 2018-12-29 DIAGNOSIS — I10 Essential (primary) hypertension: Secondary | ICD-10-CM | POA: Insufficient documentation

## 2018-12-29 DIAGNOSIS — Z79899 Other long term (current) drug therapy: Secondary | ICD-10-CM | POA: Diagnosis not present

## 2018-12-29 DIAGNOSIS — G629 Polyneuropathy, unspecified: Secondary | ICD-10-CM | POA: Insufficient documentation

## 2018-12-29 DIAGNOSIS — E785 Hyperlipidemia, unspecified: Secondary | ICD-10-CM | POA: Insufficient documentation

## 2018-12-29 DIAGNOSIS — Z86711 Personal history of pulmonary embolism: Secondary | ICD-10-CM | POA: Insufficient documentation

## 2018-12-29 DIAGNOSIS — C50412 Malignant neoplasm of upper-outer quadrant of left female breast: Secondary | ICD-10-CM | POA: Insufficient documentation

## 2018-12-29 DIAGNOSIS — Z7984 Long term (current) use of oral hypoglycemic drugs: Secondary | ICD-10-CM | POA: Insufficient documentation

## 2018-12-29 DIAGNOSIS — Z7902 Long term (current) use of antithrombotics/antiplatelets: Secondary | ICD-10-CM | POA: Diagnosis not present

## 2018-12-29 DIAGNOSIS — C7949 Secondary malignant neoplasm of other parts of nervous system: Secondary | ICD-10-CM | POA: Insufficient documentation

## 2018-12-29 DIAGNOSIS — F17210 Nicotine dependence, cigarettes, uncomplicated: Secondary | ICD-10-CM | POA: Diagnosis not present

## 2018-12-29 DIAGNOSIS — I471 Supraventricular tachycardia: Secondary | ICD-10-CM | POA: Diagnosis not present

## 2018-12-29 MED ORDER — FILGRASTIM 480 MCG/0.8ML IJ SOSY
480.0000 ug | PREFILLED_SYRINGE | Freq: Once | INTRAMUSCULAR | Status: AC
  Administered 2018-12-29: 480 ug via SUBCUTANEOUS
  Filled 2018-12-29: qty 0.8

## 2018-12-30 ENCOUNTER — Inpatient Hospital Stay: Payer: Medicare HMO

## 2018-12-30 ENCOUNTER — Other Ambulatory Visit: Payer: Self-pay

## 2018-12-30 DIAGNOSIS — C7931 Secondary malignant neoplasm of brain: Secondary | ICD-10-CM | POA: Diagnosis not present

## 2018-12-30 DIAGNOSIS — C50412 Malignant neoplasm of upper-outer quadrant of left female breast: Secondary | ICD-10-CM

## 2018-12-30 DIAGNOSIS — C787 Secondary malignant neoplasm of liver and intrahepatic bile duct: Secondary | ICD-10-CM | POA: Diagnosis not present

## 2018-12-30 DIAGNOSIS — E785 Hyperlipidemia, unspecified: Secondary | ICD-10-CM | POA: Diagnosis not present

## 2018-12-30 DIAGNOSIS — Z170 Estrogen receptor positive status [ER+]: Secondary | ICD-10-CM

## 2018-12-30 DIAGNOSIS — G629 Polyneuropathy, unspecified: Secondary | ICD-10-CM | POA: Diagnosis not present

## 2018-12-30 DIAGNOSIS — F17210 Nicotine dependence, cigarettes, uncomplicated: Secondary | ICD-10-CM | POA: Diagnosis not present

## 2018-12-30 DIAGNOSIS — E860 Dehydration: Secondary | ICD-10-CM

## 2018-12-30 DIAGNOSIS — D709 Neutropenia, unspecified: Secondary | ICD-10-CM | POA: Diagnosis not present

## 2018-12-30 DIAGNOSIS — C7949 Secondary malignant neoplasm of other parts of nervous system: Secondary | ICD-10-CM | POA: Diagnosis not present

## 2018-12-30 MED ORDER — FILGRASTIM 480 MCG/0.8ML IJ SOSY
480.0000 ug | PREFILLED_SYRINGE | Freq: Once | INTRAMUSCULAR | Status: AC
  Administered 2018-12-30: 480 ug via SUBCUTANEOUS
  Filled 2018-12-30: qty 0.8

## 2019-01-10 ENCOUNTER — Other Ambulatory Visit: Payer: Self-pay

## 2019-01-10 ENCOUNTER — Inpatient Hospital Stay (HOSPITAL_BASED_OUTPATIENT_CLINIC_OR_DEPARTMENT_OTHER): Payer: Medicare HMO | Admitting: Internal Medicine

## 2019-01-10 ENCOUNTER — Inpatient Hospital Stay: Payer: Medicare HMO

## 2019-01-10 ENCOUNTER — Encounter: Payer: Self-pay | Admitting: Internal Medicine

## 2019-01-10 VITALS — BP 117/82 | HR 109 | Temp 97.8°F | Resp 16 | Wt 151.6 lb

## 2019-01-10 DIAGNOSIS — C7949 Secondary malignant neoplasm of other parts of nervous system: Secondary | ICD-10-CM | POA: Diagnosis not present

## 2019-01-10 DIAGNOSIS — C787 Secondary malignant neoplasm of liver and intrahepatic bile duct: Secondary | ICD-10-CM

## 2019-01-10 DIAGNOSIS — E785 Hyperlipidemia, unspecified: Secondary | ICD-10-CM | POA: Diagnosis not present

## 2019-01-10 DIAGNOSIS — G629 Polyneuropathy, unspecified: Secondary | ICD-10-CM

## 2019-01-10 DIAGNOSIS — C50412 Malignant neoplasm of upper-outer quadrant of left female breast: Secondary | ICD-10-CM

## 2019-01-10 DIAGNOSIS — Z86711 Personal history of pulmonary embolism: Secondary | ICD-10-CM

## 2019-01-10 DIAGNOSIS — C7931 Secondary malignant neoplasm of brain: Secondary | ICD-10-CM

## 2019-01-10 DIAGNOSIS — I252 Old myocardial infarction: Secondary | ICD-10-CM

## 2019-01-10 DIAGNOSIS — Z170 Estrogen receptor positive status [ER+]: Secondary | ICD-10-CM

## 2019-01-10 DIAGNOSIS — D709 Neutropenia, unspecified: Secondary | ICD-10-CM

## 2019-01-10 DIAGNOSIS — F17210 Nicotine dependence, cigarettes, uncomplicated: Secondary | ICD-10-CM | POA: Diagnosis not present

## 2019-01-10 DIAGNOSIS — I10 Essential (primary) hypertension: Secondary | ICD-10-CM

## 2019-01-10 DIAGNOSIS — I429 Cardiomyopathy, unspecified: Secondary | ICD-10-CM

## 2019-01-10 DIAGNOSIS — E860 Dehydration: Secondary | ICD-10-CM

## 2019-01-10 DIAGNOSIS — Z7902 Long term (current) use of antithrombotics/antiplatelets: Secondary | ICD-10-CM

## 2019-01-10 DIAGNOSIS — Z79899 Other long term (current) drug therapy: Secondary | ICD-10-CM

## 2019-01-10 DIAGNOSIS — Z7984 Long term (current) use of oral hypoglycemic drugs: Secondary | ICD-10-CM

## 2019-01-10 DIAGNOSIS — I471 Supraventricular tachycardia: Secondary | ICD-10-CM

## 2019-01-10 DIAGNOSIS — M8580 Other specified disorders of bone density and structure, unspecified site: Secondary | ICD-10-CM

## 2019-01-10 LAB — CBC WITH DIFFERENTIAL/PLATELET
Abs Immature Granulocytes: 0 10*3/uL (ref 0.00–0.07)
Basophils Absolute: 0 10*3/uL (ref 0.0–0.1)
Basophils Relative: 1 %
Eosinophils Absolute: 0 10*3/uL (ref 0.0–0.5)
Eosinophils Relative: 0 %
HCT: 40.2 % (ref 36.0–46.0)
Hemoglobin: 13.1 g/dL (ref 12.0–15.0)
Immature Granulocytes: 0 %
Lymphocytes Relative: 44 %
Lymphs Abs: 0.6 10*3/uL — ABNORMAL LOW (ref 0.7–4.0)
MCH: 30.7 pg (ref 26.0–34.0)
MCHC: 32.6 g/dL (ref 30.0–36.0)
MCV: 94.1 fL (ref 80.0–100.0)
Monocytes Absolute: 0.2 10*3/uL (ref 0.1–1.0)
Monocytes Relative: 12 %
Neutro Abs: 0.6 10*3/uL — ABNORMAL LOW (ref 1.7–7.7)
Neutrophils Relative %: 43 %
Platelets: 267 10*3/uL (ref 150–400)
RBC: 4.27 MIL/uL (ref 3.87–5.11)
RDW: 17.4 % — ABNORMAL HIGH (ref 11.5–15.5)
Smear Review: ADEQUATE
WBC: 1.3 10*3/uL — CL (ref 4.0–10.5)
nRBC: 0 % (ref 0.0–0.2)

## 2019-01-10 LAB — COMPREHENSIVE METABOLIC PANEL
ALT: 30 U/L (ref 0–44)
AST: 41 U/L (ref 15–41)
Albumin: 3.7 g/dL (ref 3.5–5.0)
Alkaline Phosphatase: 265 U/L — ABNORMAL HIGH (ref 38–126)
Anion gap: 5 (ref 5–15)
BUN: 18 mg/dL (ref 8–23)
CO2: 27 mmol/L (ref 22–32)
Calcium: 9.1 mg/dL (ref 8.9–10.3)
Chloride: 105 mmol/L (ref 98–111)
Creatinine, Ser: 0.73 mg/dL (ref 0.44–1.00)
GFR calc Af Amer: >60 mL/min (ref >60)
GFR calc non Af Amer: >60 mL/min (ref >60)
Glucose, Bld: 83 mg/dL (ref 70–99)
Potassium: 4 mmol/L (ref 3.5–5.1)
Sodium: 137 mmol/L (ref 135–145)
Total Bilirubin: 1.2 mg/dL (ref 0.3–1.2)
Total Protein: 7.5 g/dL (ref 6.5–8.1)

## 2019-01-10 MED ORDER — FILGRASTIM 480 MCG/0.8ML IJ SOSY
480.0000 ug | PREFILLED_SYRINGE | Freq: Once | INTRAMUSCULAR | Status: AC
  Administered 2019-01-10: 11:00:00 480 ug via SUBCUTANEOUS
  Filled 2019-01-10: qty 0.8

## 2019-01-10 MED ORDER — SODIUM CHLORIDE 0.9% FLUSH
10.0000 mL | INTRAVENOUS | Status: DC | PRN
  Administered 2019-01-10: 10 mL via INTRAVENOUS
  Filled 2019-01-10: qty 10

## 2019-01-10 MED ORDER — HEPARIN SOD (PORK) LOCK FLUSH 100 UNIT/ML IV SOLN
500.0000 [IU] | Freq: Once | INTRAVENOUS | Status: AC
  Administered 2019-01-10: 500 [IU] via INTRAVENOUS

## 2019-01-10 NOTE — Assessment & Plan Note (Addendum)
#  Recurrent metastatic breast cancer-ER PR positive HER-2 negative; FEB 17th CT- mixed response in liver [few lesion smaller few larger]. Currently on Eribulin [started on 2/25]  # HOLD chemo today; sec to severe neutropenia. See plan below.  Will dose reduced eribulin 2.8 mg/m.  We will plan to repeat a scan sometime in mid May 2020.  Discussed with the patient.  #Neutropenia- not improved;  continue neupogen prohylaxis.   #Elevated alkaline phosphatase- 2 ? Cancer vs medications.    Stable.   # Brain metastases-recurrent/likely leptomeningeal disease;  S/p WBRT [April 2019]. NOv 19th MRI stable-no progressive leptomeningeal disease. Stable.   #Peripheral neuropathy grade 1-  on Lyrica; stable. .   # DISPOSITION: # HOLD Eribulin today # START NEUPOGEN today; daily x until 4/17.  # in 1 week- cbc/bmp- ERIBULIN; START d-2 Neupogen x 3 days # follow up in 2 weeks-cbc/cmp/MD- ERIBULIN; START d-2 Neupogen x 3 days

## 2019-01-10 NOTE — Progress Notes (Signed)
[facility] OFFICE PROGRESS NOTE  Patient Care Team: Steele Sizer, MD as PCP - General (Family Medicine) Wellington Hampshire, MD as PCP - Cardiology (Cardiology) Bary Castilla, Forest Gleason, MD (General Surgery) [name], MD (Inactive) as Attending Physician (Internal Medicine)  Cancer Staging No matching staging information was found for the patient.   Oncology History   # LEFT BREAST IDC; STAGE II [cT2N1] ER >90%; PR- 50-90%; her 2 NEU- POS; s/p Neoadj chemo; AUG 2016- TCH+P s/p Lumpec & partial ALND- path CR; s/p RT [finished Nov 2016];  adj Herceptin; HELD for Jan 19th 2017 [in DEC 2016-EF dropped from 63 to 51%; FEB 27th EF-42.9%]; JAN 2016 START Arimidex; May 2017- EF- 60%; May 24th 2017-Re-start Herceptin q 3W; July 28th STOP herceptin [finished 87m/m2; sec to Low EF; however 2017 Oct EF= 67%; improved]  # DEC 4th 2017- Start Neratinib 4 pills; DEC 11th 3 pills; STOPPED.  # FEB 2018- RECURRENCE ER/PR positive; Her 2 NEGATIVE [liver Bx]  # MARCH 1st 2018- Tax-Cytoxan [poor tol]  # FEB 2018- Brain mets [SBRT; Dr.Crystal; finished Feb 28th 2018]  # March 2018- Eribulin- poor tolerance  # June 8th 2018- Started Faslodex + Abema [abema-multiple interruptions sec to neutropenia]  # MRI Brain- March 2019- Leptomeningeal mets [WBRT; No LP s/p WBRT- 01/09/2018]; STOP Abema [523mday-sec to severe cytopenia]+faslodex  # MAY 20tth 019- Start Xeloda; STOPPED in Nov 22nd 2019- sec to Progressive liver lesions on CT scan  # NOV 26th 2019- Faslodex + Piqaray 25051m; Jan 2020- Piqray 300 mg+ faslodex; FEB 17th CT scan- mixed response/ Overall progression; STOP Piqray + faslodex.   # FEB 25th 2020- ERIBULIN.   # PN- G-2; may 2017- Cymbalta 11m24mMARCH 2018-  acute vascular insuff of BIL LE [? Taxotere vasoconstriction on asprin]; # hemorragic shock LGIB [s/p colo; Dr.Wohl]  #  SVT- on flecainide.   # April 2016- Liver Bx- NEG;   # Drop in EF from Herceptin [recovered  OCt 27th- EF 65%]  # BMD- jan 2017- osteopenia  -----------------------------------------------------------     [place] Sep 4th [place]- PDL-1 0%/NEG for BRCA; MSI-Stable; Positive for PI3K; ccnd-1; FGF** -----------------------------------------------------------  Dx: Metastatic Breast cancer [ER/PR-Pos; her -2 NEG] Stage IV; goals: palliative Current treatment- ERIBULIN.      Carcinoma of upper-outer quadrant of left breast in female, estrogen receptor positive (HCC)[place]2/25/2020 -  Chemotherapy    The patient had eriBULin mesylate ([place]) 2.1 mg in sodium chloride 0.9 % 100 mL chemo infusion, 1.2 mg/m2 = 2.1 mg (100 % of original dose 1.2 mg/m2), Intravenous,  Once, 2 of 4 cycles Dose modification: 1.2 mg/m2 (original dose 1.2 mg/m2, Cycle 1, Reason: Provider Judgment), 0.8 mg/m2 (original dose 1.2 mg/m2, Cycle 3, Reason: Provider Judgment) Administration: 2.1 mg (11/22/2018), 2.1 mg (11/29/2018), 2.1 mg (12/20/2018)  for chemotherapy treatment.      INTERVAL HISTORY:  Sarah Horne.  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  Patient was not able to get her cycle number 2-day 8 eribulin because of neutropenia.  Patient had previous received neutropenia after cycle number 2-day 1 treatment  No fever chills.  No nausea no vomiting.  Chronic tingling and numbness in extremities.  Review of Systems  Constitutional: Positive for malaise/fatigue. Negative for chills, diaphoresis, fever and weight loss.  HENT: Negative for nosebleeds and sore throat.   Eyes: Negative for double vision.  Respiratory: Negative for cough, hemoptysis, sputum production, shortness of breath and wheezing.   Cardiovascular: Negative for chest pain, palpitations, orthopnea and leg swelling.  Gastrointestinal: Negative for abdominal pain, blood in stool,  constipation, heartburn, melena, nausea and vomiting.  Genitourinary: Negative for dysuria, frequency and urgency.  Musculoskeletal: Positive for joint pain. Negative for back pain.  Skin: Negative for itching.  Neurological: Positive for tingling. Negative for dizziness, focal weakness, weakness and headaches.  Endo/Heme/Allergies: Does not bruise/bleed easily.  Psychiatric/Behavioral: Negative for depression. The patient is not nervous/anxious and does not have insomnia.       PAST MEDICAL HISTORY :  Past Medical History:  Diagnosis Date  . Chemotherapy-induced peripheral neuropathy ([place])   . Epistaxis    a. 11/2016 in setting of asa/plavix-->silver nitrate cauterization.  . GI bleed    a. 11/2016 Admission w/ GIB and hypovolemic shock req 3u PRBC's;  b. 11/2016 ECG: gastritis & nonbleeding peptic ulcer; c. 11/2016 Conlonoscopy: rectal and sigmoid colonic ulcers.  . Heart attack ([place])    a. 1998 Cath @ [facility]: reportedly no intervention required.  Marland Kitchen Herceptin-induced cardiomyopathy ([place])    a. In the setting of Herceptin Rx for breast cancer (initiated 12/2014); b. 03/2015 MUGA EF 64%; b. 08/2015 MUGA: EF 51%; c. 10/2015 MUGA: EF 44%; d. 11/2015 Echo: EF 45-50%; e. 01/2016 MUGA: EF 60%; f. 06/2016 MUGA EF 65%; g. 10/2016 MUGA: EF 61%;  h. 12/2016 Echo: EF 55-60%, gr1 DD.  Marland Kitchen Hyperlipidemia   . Hypertension   . Neuropathy   . Possible PAD (peripheral artery disease) ([place])    a. 11/2016 LE cyanosis and weak pulses-->CTA w/o significant Ao-BiFem dzs. ? distal dzs-->ASA/Plavix initiated by vascular surgery.  Marland Kitchen PSVT (paroxysmal supraventricular tachycardia) ([place])    a. Dx 11/2016.  . Pulmonary embolism ([place])    a. 12/2016 CTA Chest: small nonocclusive PE in inferior segment of the Left lingula, somewhat eccentric filling defect suggesting chronic rather than acute embolic event; b. 03/7411 LE U/S:  No DVT; c. 12/2016 Echo: Nl RV fxn, nl PASP.  Marland Kitchen Recurrent Metastatic breast cancer ([place])    a. Dx 2016: Stage  II, ER positive, PR positive, HER-2/neu overexpressing of the left breast-->chemo/radiation; b. 10/2016 CT Abd/pelvis: diffuse liver mets, ill defined sclerotic bone lesions-T12;  c. 10/2016 MRI brain: metastatic lesion along L temporal lobe (19x69m) w/ extensive surrounding edema & 528mmidline shift to right.  . Sinus tachycardia     PAST SURGICAL HISTORY :   Past Surgical History:  Procedure Laterality Date  . BREAST BIOPSY Left 2016   Positive  . BREAST LUMPECTOMY WITH SENTINEL LYMPH NODE BIOPSY Left 05/23/2015   Procedure: LEFT BREAST WIDE EXCISION WITH AXILLARY DISSECTION, MASTOPLASTY ;  Surgeon: JeRobert BellowMD;  Location: [facility] ORS;  Service: General;  Laterality: Left;  . BREAST SURGERY Left 12/18/14   breast biopsy/INVASIVE DUCTAL CARCINOMA OF BREAST, NOTTINGHAM GRADE 2.  . Marland KitchenREAST SURGERY  05/23/2015.   Wide excision/mastoplasty, axillary dissection. No residual invasive cancer, positive for residual DCIS. 0/2 nodes identified on axillary dissection. (no SLN by technetium or methylene blue)  . CARDIAC CATHETERIZATION    . COLONOSCOPY WITH PROPOFOL N/A 12/10/2016   Procedure: COLONOSCOPY WITH PROPOFOL;  Surgeon: DaLucilla LameMD;  Location: [facility] ENDOSCOPY;  Service: Endoscopy;  Laterality: N/A;  . COLONOSCOPY WITH PROPOFOL N/A 02/13/2017   Procedure: COLONOSCOPY WITH PROPOFOL;  Surgeon: WoLucilla LameMD;  [facility];  Service: Endoscopy;  Laterality: N/A;  . ESOPHAGOGASTRODUODENOSCOPY (EGD) WITH PROPOFOL N/A  12/08/2016   Procedure: ESOPHAGOGASTRODUODENOSCOPY (EGD) WITH PROPOFOL;  Surgeon: Lucilla Lame, MD;  Location: [facility] ENDOSCOPY;  Service: Endoscopy;  Laterality: N/A;  . IVC FILTER INSERTION N/A 02/15/2017   Procedure: IVC Filter Insertion;  Surgeon: Algernon Huxley, MD;  Location: [facility];  Service: Cardiovascular;  Laterality: N/A;  . PORTACATH PLACEMENT Right 12-31-14   Dr Bary Castilla    FAMILY HISTORY :   Family History  Problem Relation Age of Onset  .  Breast cancer Maternal Aunt   . Breast cancer Cousin   . Brain cancer Maternal Uncle   . Diabetes Mother   . Hypertension Mother   . Stroke Mother     SOCIAL HISTORY:   Social History   Tobacco Use  . Smoking status: Light Tobacco Smoker    Packs/day: 0.50    Years: 18.00    Pack years: 9.00    Types: Cigarettes  . Smokeless tobacco: Never Used  Substance Use Topics  . Alcohol use: No    Alcohol/week: 0.0 standard drinks  . Drug use: No    ALLERGIES:  is allergic to no known allergies.  MEDICATIONS:  Current Outpatient Medications  Medication Sig Dispense Refill  . alpelisib (PIQRAY 300MG DAILY DOSE) 2 x 150 MG Therapy Pack Take 2 tablets (334m total) by mouth once daily with food. 60 each 4  . flecainide (TAMBOCOR) 50 MG tablet Take 1 tablet (50 mg total) by mouth 2 (two) times daily. 180 tablet 3  . levocetirizine (XYZAL) 5 MG tablet Take 1 tablet (5 mg total) by mouth every evening. 30 tablet 3  . magnesium oxide (MAG-OX) 400 MG tablet TAKE 1 TABLET BY MOUTH TWICE A DAY 60 tablet 6  . metFORMIN (GLUCOPHAGE) 500 MG tablet Take 1 tablet (500 mg total) by mouth 2 (two) times daily with a meal. 60 tablet 4  . pregabalin (LYRICA) 75 MG capsule Take 1 capsule (75 mg total) by mouth 2 (two) times daily. 180 capsule 0  . triamcinolone ointment (KENALOG) 0.5 % Apply 1 application topically 2 (two) times daily. 30 g 2  . Vitamin D, Ergocalciferol, (DRISDOL) 50000 units CAPS capsule TAKE 1 CAPSULE (50,000 UNITS TOTAL) ONCE A WEEK BY MOUTH. 12 capsule 1   Current Facility-Administered Medications  Medication Dose Route Frequency Provider Last Rate Last Dose  . filgrastim (NEUPOGEN) injection 480 mcg  480 mcg Subcutaneous Once BCammie Sickle MD       Facility-Administered Medications Ordered in Other Visits  Medication Dose Route Frequency Provider Last Rate Last Dose  . 0.9 %  sodium chloride infusion   Intravenous Continuous Pandit, Sandeep, MD      . 0.9 %  sodium  chloride infusion   Intravenous Continuous FLloyd Huger MD 999 mL/hr at 04/24/15 1520    . heparin lock flush 100 unit/mL  500 Units Intravenous Once Berenzon, Dmitriy, MD      . sodium chloride 0.9 % injection 10 mL  10 mL Intravenous PRN PLeia Alf MD   10 mL at 04/04/15 1440  . sodium chloride flush (NS) 0.9 % injection 10 mL  10 mL Intravenous PRN Berenzon, Dmitriy, MD      . sodium chloride flush (NS) 0.9 % injection 10 mL  10 mL Intravenous PRN BCammie Sickle MD   10 mL at 01/10/19 0915    PHYSICAL EXAMINATION: ECOG PERFORMANCE STATUS: 1 - Symptomatic but completely ambulatory  BP 117/82 (BP Location: Left Arm, Patient Position: Sitting, Cuff Size: Normal)  Pulse (!) 109   Temp 97.8 F (36.6 C) (Tympanic)   Resp 16   Wt 151 lb 9.6 oz (68.8 kg)   BMI 24.47 kg/m   Filed Weights   01/10/19 0930  Weight: 151 lb 9.6 oz (68.8 kg)    Physical Exam  Constitutional: She is oriented to person, place, and time and well-developed, well-nourished, and in no distress.  Alone.  Walking herself.  HENT:  Head: Normocephalic and atraumatic.  Mouth/Throat: Oropharynx is clear and moist. No oropharyngeal exudate.  Eyes: Pupils are equal, round, and reactive to light.  Neck: Normal range of motion. Neck supple.  Cardiovascular: Normal rate and regular rhythm.  Pulmonary/Chest: No respiratory distress. She has no wheezes.  Abdominal: Soft. Bowel sounds are normal. She exhibits no distension and no mass. There is no abdominal tenderness. There is no rebound and no guarding.  Musculoskeletal: Normal range of motion.        General: No tenderness or edema.  Neurological: She is alert and oriented to person, place, and time.  Skin: Skin is warm.  hyperpigmentation of the bilateral hand and feet (better)  Psychiatric: Affect normal.       LABORATORY DATA:  I have reviewed the data as listed    Component Value Date/Time   NA 137 01/10/2019 0912   NA 135 03/04/2017  0918   NA 135 01/08/2015 0850   K 4.0 01/10/2019 0912   K 3.7 01/08/2015 0850   CL 105 01/10/2019 0912   CL 101 01/08/2015 0850   CO2 27 01/10/2019 0912   CO2 26 01/08/2015 0850   GLUCOSE 83 01/10/2019 0912   GLUCOSE 161 (H) 01/08/2015 0850   BUN 18 01/10/2019 0912   BUN 8 03/04/2017 0918   BUN 17 01/08/2015 0850   CREATININE 0.73 01/10/2019 0912   CREATININE 0.82 01/22/2015 1559   CALCIUM 9.1 01/10/2019 0912   CALCIUM 9.3 01/08/2015 0850   PROT 7.5 01/10/2019 0912   PROT 6.4 03/04/2017 0918   PROT 6.8 01/22/2015 1559   ALBUMIN 3.7 01/10/2019 0912   ALBUMIN 2.5 (L) 03/04/2017 0918   ALBUMIN 3.9 01/22/2015 1559   AST 41 01/10/2019 0912   AST 34 01/22/2015 1559   ALT 30 01/10/2019 0912   ALT 42 01/22/2015 1559   ALKPHOS 265 (H) 01/10/2019 0912   ALKPHOS 157 (H) 01/22/2015 1559   BILITOT 1.2 01/10/2019 0912   BILITOT 6.2 (H) 03/04/2017 0918   BILITOT 0.2 (L) 01/22/2015 1559   GFRNONAA >60 01/10/2019 0912   GFRNONAA >60 01/22/2015 1559   GFRAA >60 01/10/2019 0912   GFRAA >60 01/22/2015 1559    No results found for: SPEP, UPEP  Lab Results  Component Value Date   WBC 1.3 (LL) 01/10/2019   NEUTROABS 0.6 (L) 01/10/2019   HGB 13.1 01/10/2019   HCT 40.2 01/10/2019   MCV 94.1 01/10/2019   PLT 267 01/10/2019      Chemistry      Component Value Date/Time   NA 137 01/10/2019 0912   NA 135 03/04/2017 0918   NA 135 01/08/2015 0850   K 4.0 01/10/2019 0912   K 3.7 01/08/2015 0850   CL 105 01/10/2019 0912   CL 101 01/08/2015 0850   CO2 27 01/10/2019 0912   CO2 26 01/08/2015 0850   BUN 18 01/10/2019 0912   BUN 8 03/04/2017 0918   BUN 17 01/08/2015 0850   CREATININE 0.73 01/10/2019 0912   CREATININE 0.82 01/22/2015 1559      Component  Value Date/Time   CALCIUM 9.1 01/10/2019 0912   CALCIUM 9.3 01/08/2015 0850   ALKPHOS 265 (H) 01/10/2019 0912   ALKPHOS 157 (H) 01/22/2015 1559   AST 41 01/10/2019 0912   AST 34 01/22/2015 1559   ALT 30 01/10/2019 0912   ALT 42  01/22/2015 1559   BILITOT 1.2 01/10/2019 0912   BILITOT 6.2 (H) 03/04/2017 0918   BILITOT 0.2 (L) 01/22/2015 1559       RADIOGRAPHIC STUDIES: I have personally reviewed the radiological images as listed and agreed with the findings in the report. No results found.   ASSESSMENT & PLAN:  Carcinoma of upper-outer quadrant of left breast in female, estrogen receptor positive ([place]) # Recurrent metastatic breast cancer-ER PR positive HER-2 negative; FEB 17th CT- mixed response in liver [few lesion smaller few larger]. Currently on Eribulin [started on 2/25]  # HOLD chemo today; sec to severe neutropenia. See plan below.  Will dose reduced eribulin 2.8 mg/m.  We will plan to repeat a scan sometime in mid May 2020.  Discussed with the patient.  #Neutropenia- not improved;  continue neupogen prohylaxis.   #Elevated alkaline phosphatase- 2 ? Cancer vs medications.    Stable.   # Brain metastases-recurrent/likely leptomeningeal disease;  S/p WBRT [April 2019]. NOv 19th MRI stable-no progressive leptomeningeal disease. Stable.   #Peripheral neuropathy grade 1-  on Lyrica; stable. .   # DISPOSITION: # HOLD Eribulin today # START NEUPOGEN today; daily x until 4/17.  # in 1 week- cbc/bmp- ERIBULIN; START d-2 Neupogen x 3 days # follow up in 2 weeks-cbc/cmp/MD- ERIBULIN; START d-2 Neupogen x 3 days     Orders Placed This Encounter  Procedures  . CBC with Differential    Standing Status:   Future    Standing Expiration Date:   01/10/2020  . Basic metabolic panel    Standing Status:   Future    Standing Expiration Date:   01/10/2020  . CBC with Differential    Standing Status:   Future    Standing Expiration Date:   01/10/2020  . Comprehensive metabolic panel    Standing Status:   Future    Standing Expiration Date:   01/10/2020   All questions were answered. The patient knows to call the clinic with any problems, questions or concerns.      Cammie Sickle, MD 01/10/2019 10:42  AM

## 2019-01-10 NOTE — Progress Notes (Signed)
Patient not getting eribulin today, getting neupogen

## 2019-01-11 ENCOUNTER — Other Ambulatory Visit: Payer: Self-pay

## 2019-01-11 ENCOUNTER — Inpatient Hospital Stay: Payer: Medicare HMO

## 2019-01-11 DIAGNOSIS — F17210 Nicotine dependence, cigarettes, uncomplicated: Secondary | ICD-10-CM | POA: Diagnosis not present

## 2019-01-11 DIAGNOSIS — E860 Dehydration: Secondary | ICD-10-CM

## 2019-01-11 DIAGNOSIS — E785 Hyperlipidemia, unspecified: Secondary | ICD-10-CM | POA: Diagnosis not present

## 2019-01-11 DIAGNOSIS — C50412 Malignant neoplasm of upper-outer quadrant of left female breast: Secondary | ICD-10-CM | POA: Diagnosis not present

## 2019-01-11 DIAGNOSIS — Z170 Estrogen receptor positive status [ER+]: Secondary | ICD-10-CM

## 2019-01-11 DIAGNOSIS — C7931 Secondary malignant neoplasm of brain: Secondary | ICD-10-CM | POA: Diagnosis not present

## 2019-01-11 DIAGNOSIS — D709 Neutropenia, unspecified: Secondary | ICD-10-CM | POA: Diagnosis not present

## 2019-01-11 DIAGNOSIS — C787 Secondary malignant neoplasm of liver and intrahepatic bile duct: Secondary | ICD-10-CM | POA: Diagnosis not present

## 2019-01-11 DIAGNOSIS — G629 Polyneuropathy, unspecified: Secondary | ICD-10-CM | POA: Diagnosis not present

## 2019-01-11 DIAGNOSIS — C7949 Secondary malignant neoplasm of other parts of nervous system: Secondary | ICD-10-CM | POA: Diagnosis not present

## 2019-01-11 LAB — CEA: CEA: 3.4 ng/mL (ref 0.0–4.7)

## 2019-01-11 LAB — CANCER ANTIGEN 27.29: CA 27.29: 25.9 U/mL (ref 0.0–38.6)

## 2019-01-11 LAB — CANCER ANTIGEN 15-3: CA 15-3: 22 U/mL (ref 0.0–25.0)

## 2019-01-11 MED ORDER — FILGRASTIM 480 MCG/0.8ML IJ SOSY
480.0000 ug | PREFILLED_SYRINGE | Freq: Once | INTRAMUSCULAR | Status: AC
  Administered 2019-01-11: 480 ug via SUBCUTANEOUS
  Filled 2019-01-11: qty 0.8

## 2019-01-12 ENCOUNTER — Other Ambulatory Visit: Payer: Self-pay

## 2019-01-12 ENCOUNTER — Inpatient Hospital Stay: Payer: Medicare HMO

## 2019-01-12 DIAGNOSIS — C787 Secondary malignant neoplasm of liver and intrahepatic bile duct: Secondary | ICD-10-CM | POA: Diagnosis not present

## 2019-01-12 DIAGNOSIS — C7949 Secondary malignant neoplasm of other parts of nervous system: Secondary | ICD-10-CM | POA: Diagnosis not present

## 2019-01-12 DIAGNOSIS — E860 Dehydration: Secondary | ICD-10-CM

## 2019-01-12 DIAGNOSIS — C50412 Malignant neoplasm of upper-outer quadrant of left female breast: Secondary | ICD-10-CM | POA: Diagnosis not present

## 2019-01-12 DIAGNOSIS — E785 Hyperlipidemia, unspecified: Secondary | ICD-10-CM | POA: Diagnosis not present

## 2019-01-12 DIAGNOSIS — Z170 Estrogen receptor positive status [ER+]: Secondary | ICD-10-CM | POA: Diagnosis not present

## 2019-01-12 DIAGNOSIS — F17210 Nicotine dependence, cigarettes, uncomplicated: Secondary | ICD-10-CM | POA: Diagnosis not present

## 2019-01-12 DIAGNOSIS — D709 Neutropenia, unspecified: Secondary | ICD-10-CM | POA: Diagnosis not present

## 2019-01-12 DIAGNOSIS — G629 Polyneuropathy, unspecified: Secondary | ICD-10-CM | POA: Diagnosis not present

## 2019-01-12 DIAGNOSIS — C7931 Secondary malignant neoplasm of brain: Secondary | ICD-10-CM | POA: Diagnosis not present

## 2019-01-12 MED ORDER — FILGRASTIM 480 MCG/0.8ML IJ SOSY
480.0000 ug | PREFILLED_SYRINGE | Freq: Once | INTRAMUSCULAR | Status: AC
  Administered 2019-01-12: 480 ug via SUBCUTANEOUS
  Filled 2019-01-12: qty 0.8

## 2019-01-13 ENCOUNTER — Other Ambulatory Visit: Payer: Self-pay

## 2019-01-13 ENCOUNTER — Inpatient Hospital Stay: Payer: Medicare HMO

## 2019-01-13 DIAGNOSIS — C7931 Secondary malignant neoplasm of brain: Secondary | ICD-10-CM | POA: Diagnosis not present

## 2019-01-13 DIAGNOSIS — E785 Hyperlipidemia, unspecified: Secondary | ICD-10-CM | POA: Diagnosis not present

## 2019-01-13 DIAGNOSIS — C50412 Malignant neoplasm of upper-outer quadrant of left female breast: Secondary | ICD-10-CM

## 2019-01-13 DIAGNOSIS — C7949 Secondary malignant neoplasm of other parts of nervous system: Secondary | ICD-10-CM | POA: Diagnosis not present

## 2019-01-13 DIAGNOSIS — D709 Neutropenia, unspecified: Secondary | ICD-10-CM | POA: Diagnosis not present

## 2019-01-13 DIAGNOSIS — F17210 Nicotine dependence, cigarettes, uncomplicated: Secondary | ICD-10-CM | POA: Diagnosis not present

## 2019-01-13 DIAGNOSIS — E860 Dehydration: Secondary | ICD-10-CM

## 2019-01-13 DIAGNOSIS — Z170 Estrogen receptor positive status [ER+]: Secondary | ICD-10-CM

## 2019-01-13 DIAGNOSIS — G629 Polyneuropathy, unspecified: Secondary | ICD-10-CM | POA: Diagnosis not present

## 2019-01-13 DIAGNOSIS — C787 Secondary malignant neoplasm of liver and intrahepatic bile duct: Secondary | ICD-10-CM | POA: Diagnosis not present

## 2019-01-13 MED ORDER — FILGRASTIM 480 MCG/0.8ML IJ SOSY
480.0000 ug | PREFILLED_SYRINGE | Freq: Once | INTRAMUSCULAR | Status: AC
  Administered 2019-01-13: 480 ug via SUBCUTANEOUS
  Filled 2019-01-13: qty 0.8

## 2019-01-17 ENCOUNTER — Other Ambulatory Visit: Payer: Self-pay

## 2019-01-17 ENCOUNTER — Other Ambulatory Visit: Payer: Self-pay | Admitting: Internal Medicine

## 2019-01-17 ENCOUNTER — Inpatient Hospital Stay: Payer: Medicare HMO | Admitting: *Deleted

## 2019-01-17 ENCOUNTER — Inpatient Hospital Stay: Payer: Medicare HMO

## 2019-01-17 VITALS — BP 151/97 | HR 99 | Temp 99.0°F | Resp 18 | Wt 151.5 lb

## 2019-01-17 DIAGNOSIS — Z95828 Presence of other vascular implants and grafts: Secondary | ICD-10-CM

## 2019-01-17 DIAGNOSIS — C50412 Malignant neoplasm of upper-outer quadrant of left female breast: Secondary | ICD-10-CM

## 2019-01-17 DIAGNOSIS — G629 Polyneuropathy, unspecified: Secondary | ICD-10-CM | POA: Diagnosis not present

## 2019-01-17 DIAGNOSIS — Z170 Estrogen receptor positive status [ER+]: Secondary | ICD-10-CM

## 2019-01-17 DIAGNOSIS — F17210 Nicotine dependence, cigarettes, uncomplicated: Secondary | ICD-10-CM | POA: Diagnosis not present

## 2019-01-17 DIAGNOSIS — Z7189 Other specified counseling: Secondary | ICD-10-CM

## 2019-01-17 DIAGNOSIS — E785 Hyperlipidemia, unspecified: Secondary | ICD-10-CM | POA: Diagnosis not present

## 2019-01-17 DIAGNOSIS — C7931 Secondary malignant neoplasm of brain: Secondary | ICD-10-CM | POA: Diagnosis not present

## 2019-01-17 DIAGNOSIS — D709 Neutropenia, unspecified: Secondary | ICD-10-CM | POA: Diagnosis not present

## 2019-01-17 DIAGNOSIS — C7949 Secondary malignant neoplasm of other parts of nervous system: Secondary | ICD-10-CM | POA: Diagnosis not present

## 2019-01-17 DIAGNOSIS — C787 Secondary malignant neoplasm of liver and intrahepatic bile duct: Secondary | ICD-10-CM | POA: Diagnosis not present

## 2019-01-17 LAB — CBC WITH DIFFERENTIAL/PLATELET
Abs Immature Granulocytes: 0.02 10*3/uL (ref 0.00–0.07)
Basophils Absolute: 0 10*3/uL (ref 0.0–0.1)
Basophils Relative: 1 %
Eosinophils Absolute: 0.2 10*3/uL (ref 0.0–0.5)
Eosinophils Relative: 8 %
HCT: 43 % (ref 36.0–46.0)
Hemoglobin: 13.8 g/dL (ref 12.0–15.0)
Immature Granulocytes: 1 %
Lymphocytes Relative: 31 %
Lymphs Abs: 0.8 10*3/uL (ref 0.7–4.0)
MCH: 30.5 pg (ref 26.0–34.0)
MCHC: 32.1 g/dL (ref 30.0–36.0)
MCV: 94.9 fL (ref 80.0–100.0)
Monocytes Absolute: 0.5 10*3/uL (ref 0.1–1.0)
Monocytes Relative: 17 %
Neutro Abs: 1.1 10*3/uL — ABNORMAL LOW (ref 1.7–7.7)
Neutrophils Relative %: 42 %
Platelets: 219 10*3/uL (ref 150–400)
RBC: 4.53 MIL/uL (ref 3.87–5.11)
RDW: 17 % — ABNORMAL HIGH (ref 11.5–15.5)
WBC: 2.6 10*3/uL — ABNORMAL LOW (ref 4.0–10.5)
nRBC: 0 % (ref 0.0–0.2)

## 2019-01-17 LAB — COMPREHENSIVE METABOLIC PANEL
ALT: 29 U/L (ref 0–44)
AST: 36 U/L (ref 15–41)
Albumin: 3.8 g/dL (ref 3.5–5.0)
Alkaline Phosphatase: 280 U/L — ABNORMAL HIGH (ref 38–126)
Anion gap: 7 (ref 5–15)
BUN: 14 mg/dL (ref 8–23)
CO2: 29 mmol/L (ref 22–32)
Calcium: 9 mg/dL (ref 8.9–10.3)
Chloride: 105 mmol/L (ref 98–111)
Creatinine, Ser: 0.83 mg/dL (ref 0.44–1.00)
GFR calc Af Amer: >60 mL/min (ref >60)
GFR calc non Af Amer: >60 mL/min (ref >60)
Glucose, Bld: 88 mg/dL (ref 70–99)
Potassium: 4 mmol/L (ref 3.5–5.1)
Sodium: 141 mmol/L (ref 135–145)
Total Bilirubin: 0.7 mg/dL (ref 0.3–1.2)
Total Protein: 7.3 g/dL (ref 6.5–8.1)

## 2019-01-17 MED ORDER — SODIUM CHLORIDE 0.9 % IV SOLN
0.8000 mg/m2 | Freq: Once | INTRAVENOUS | Status: AC
  Administered 2019-01-17: 1.4 mg via INTRAVENOUS
  Filled 2019-01-17: qty 2.8

## 2019-01-17 MED ORDER — SODIUM CHLORIDE 0.9% FLUSH
10.0000 mL | Freq: Once | INTRAVENOUS | Status: AC
  Administered 2019-01-17: 10:00:00 10 mL via INTRAVENOUS
  Filled 2019-01-17: qty 10

## 2019-01-17 MED ORDER — SODIUM CHLORIDE 0.9 % IV SOLN
Freq: Once | INTRAVENOUS | Status: AC
  Administered 2019-01-17: 10:00:00 via INTRAVENOUS
  Filled 2019-01-17: qty 250

## 2019-01-17 MED ORDER — PROCHLORPERAZINE MALEATE 10 MG PO TABS
10.0000 mg | ORAL_TABLET | Freq: Once | ORAL | Status: AC
  Administered 2019-01-17: 10 mg via ORAL
  Filled 2019-01-17: qty 1

## 2019-01-17 MED ORDER — HEPARIN SOD (PORK) LOCK FLUSH 100 UNIT/ML IV SOLN
500.0000 [IU] | Freq: Once | INTRAVENOUS | Status: AC
  Administered 2019-01-17: 500 [IU] via INTRAVENOUS
  Filled 2019-01-17: qty 5

## 2019-01-18 ENCOUNTER — Inpatient Hospital Stay: Payer: Medicare HMO

## 2019-01-18 ENCOUNTER — Other Ambulatory Visit: Payer: Self-pay

## 2019-01-18 DIAGNOSIS — Z170 Estrogen receptor positive status [ER+]: Secondary | ICD-10-CM | POA: Diagnosis not present

## 2019-01-18 DIAGNOSIS — F17210 Nicotine dependence, cigarettes, uncomplicated: Secondary | ICD-10-CM | POA: Diagnosis not present

## 2019-01-18 DIAGNOSIS — C7949 Secondary malignant neoplasm of other parts of nervous system: Secondary | ICD-10-CM | POA: Diagnosis not present

## 2019-01-18 DIAGNOSIS — E860 Dehydration: Secondary | ICD-10-CM

## 2019-01-18 DIAGNOSIS — C50412 Malignant neoplasm of upper-outer quadrant of left female breast: Secondary | ICD-10-CM

## 2019-01-18 DIAGNOSIS — G629 Polyneuropathy, unspecified: Secondary | ICD-10-CM | POA: Diagnosis not present

## 2019-01-18 DIAGNOSIS — E785 Hyperlipidemia, unspecified: Secondary | ICD-10-CM | POA: Diagnosis not present

## 2019-01-18 DIAGNOSIS — C787 Secondary malignant neoplasm of liver and intrahepatic bile duct: Secondary | ICD-10-CM | POA: Diagnosis not present

## 2019-01-18 DIAGNOSIS — D709 Neutropenia, unspecified: Secondary | ICD-10-CM | POA: Diagnosis not present

## 2019-01-18 DIAGNOSIS — C7931 Secondary malignant neoplasm of brain: Secondary | ICD-10-CM | POA: Diagnosis not present

## 2019-01-18 MED ORDER — FILGRASTIM 480 MCG/1.6ML IJ SOLN
480.0000 ug | Freq: Once | INTRAMUSCULAR | Status: AC
  Administered 2019-01-18: 480 ug via SUBCUTANEOUS
  Filled 2019-01-18: qty 1.6

## 2019-01-18 MED ORDER — FILGRASTIM 480 MCG/0.8ML IJ SOSY
480.0000 ug | PREFILLED_SYRINGE | Freq: Once | INTRAMUSCULAR | Status: DC
  Filled 2019-01-18: qty 0.8

## 2019-01-19 ENCOUNTER — Other Ambulatory Visit: Payer: Self-pay

## 2019-01-19 ENCOUNTER — Other Ambulatory Visit: Payer: Self-pay | Admitting: Internal Medicine

## 2019-01-19 ENCOUNTER — Inpatient Hospital Stay: Payer: Medicare HMO

## 2019-01-19 DIAGNOSIS — C50412 Malignant neoplasm of upper-outer quadrant of left female breast: Secondary | ICD-10-CM

## 2019-01-19 DIAGNOSIS — Z170 Estrogen receptor positive status [ER+]: Secondary | ICD-10-CM

## 2019-01-19 DIAGNOSIS — C7931 Secondary malignant neoplasm of brain: Secondary | ICD-10-CM | POA: Diagnosis not present

## 2019-01-19 DIAGNOSIS — E860 Dehydration: Secondary | ICD-10-CM

## 2019-01-19 DIAGNOSIS — C7949 Secondary malignant neoplasm of other parts of nervous system: Secondary | ICD-10-CM | POA: Diagnosis not present

## 2019-01-19 DIAGNOSIS — D709 Neutropenia, unspecified: Secondary | ICD-10-CM | POA: Diagnosis not present

## 2019-01-19 DIAGNOSIS — E785 Hyperlipidemia, unspecified: Secondary | ICD-10-CM | POA: Diagnosis not present

## 2019-01-19 DIAGNOSIS — F17210 Nicotine dependence, cigarettes, uncomplicated: Secondary | ICD-10-CM | POA: Diagnosis not present

## 2019-01-19 DIAGNOSIS — C787 Secondary malignant neoplasm of liver and intrahepatic bile duct: Secondary | ICD-10-CM | POA: Diagnosis not present

## 2019-01-19 DIAGNOSIS — G629 Polyneuropathy, unspecified: Secondary | ICD-10-CM | POA: Diagnosis not present

## 2019-01-19 MED ORDER — FILGRASTIM 480 MCG/0.8ML IJ SOSY
480.0000 ug | PREFILLED_SYRINGE | Freq: Once | INTRAMUSCULAR | Status: DC
  Filled 2019-01-19: qty 0.8

## 2019-01-19 MED ORDER — FILGRASTIM 480 MCG/1.6ML IJ SOLN
480.0000 ug | Freq: Once | INTRAMUSCULAR | Status: AC
  Administered 2019-01-19: 480 ug via SUBCUTANEOUS
  Filled 2019-01-19: qty 1.6

## 2019-01-20 ENCOUNTER — Inpatient Hospital Stay: Payer: Medicare HMO

## 2019-01-20 ENCOUNTER — Other Ambulatory Visit: Payer: Self-pay

## 2019-01-20 DIAGNOSIS — C50412 Malignant neoplasm of upper-outer quadrant of left female breast: Secondary | ICD-10-CM

## 2019-01-20 DIAGNOSIS — Z170 Estrogen receptor positive status [ER+]: Secondary | ICD-10-CM

## 2019-01-20 DIAGNOSIS — C7931 Secondary malignant neoplasm of brain: Secondary | ICD-10-CM | POA: Diagnosis not present

## 2019-01-20 DIAGNOSIS — C787 Secondary malignant neoplasm of liver and intrahepatic bile duct: Secondary | ICD-10-CM | POA: Diagnosis not present

## 2019-01-20 DIAGNOSIS — F17210 Nicotine dependence, cigarettes, uncomplicated: Secondary | ICD-10-CM | POA: Diagnosis not present

## 2019-01-20 DIAGNOSIS — E860 Dehydration: Secondary | ICD-10-CM

## 2019-01-20 DIAGNOSIS — G629 Polyneuropathy, unspecified: Secondary | ICD-10-CM | POA: Diagnosis not present

## 2019-01-20 DIAGNOSIS — D709 Neutropenia, unspecified: Secondary | ICD-10-CM | POA: Diagnosis not present

## 2019-01-20 DIAGNOSIS — E785 Hyperlipidemia, unspecified: Secondary | ICD-10-CM | POA: Diagnosis not present

## 2019-01-20 DIAGNOSIS — C7949 Secondary malignant neoplasm of other parts of nervous system: Secondary | ICD-10-CM | POA: Diagnosis not present

## 2019-01-20 MED ORDER — FILGRASTIM 480 MCG/0.8ML IJ SOSY
480.0000 ug | PREFILLED_SYRINGE | Freq: Once | INTRAMUSCULAR | Status: AC
  Administered 2019-01-20: 480 ug via SUBCUTANEOUS
  Filled 2019-01-20: qty 0.8

## 2019-01-23 ENCOUNTER — Telehealth: Payer: Self-pay | Admitting: General Surgery

## 2019-01-23 NOTE — Telephone Encounter (Signed)
Patient states she had partial mastectomy 2016. The end of her incision she has a hard crust/scab. Not sure how long this has been there. No redness, no drainage, no fever or chills. Has appointment at the cancer center tomorrow for chemotherapy. She would like this looked at.

## 2019-01-23 NOTE — Telephone Encounter (Signed)
Patient is calling regarding her excision site said it has some crust built on it and would like to either be looked at she will be at the cancer center tomorrow and said she could come by. Please call patient and advise.

## 2019-01-24 ENCOUNTER — Other Ambulatory Visit: Payer: Self-pay

## 2019-01-24 ENCOUNTER — Inpatient Hospital Stay: Payer: Medicare HMO

## 2019-01-24 ENCOUNTER — Inpatient Hospital Stay (HOSPITAL_BASED_OUTPATIENT_CLINIC_OR_DEPARTMENT_OTHER): Payer: Medicare HMO | Admitting: Internal Medicine

## 2019-01-24 VITALS — BP 144/88 | HR 98 | Temp 97.9°F | Resp 16 | Wt 151.9 lb

## 2019-01-24 DIAGNOSIS — E785 Hyperlipidemia, unspecified: Secondary | ICD-10-CM

## 2019-01-24 DIAGNOSIS — F17210 Nicotine dependence, cigarettes, uncomplicated: Secondary | ICD-10-CM

## 2019-01-24 DIAGNOSIS — I10 Essential (primary) hypertension: Secondary | ICD-10-CM

## 2019-01-24 DIAGNOSIS — I252 Old myocardial infarction: Secondary | ICD-10-CM

## 2019-01-24 DIAGNOSIS — Z7902 Long term (current) use of antithrombotics/antiplatelets: Secondary | ICD-10-CM

## 2019-01-24 DIAGNOSIS — C7931 Secondary malignant neoplasm of brain: Secondary | ICD-10-CM | POA: Diagnosis not present

## 2019-01-24 DIAGNOSIS — E860 Dehydration: Secondary | ICD-10-CM

## 2019-01-24 DIAGNOSIS — C787 Secondary malignant neoplasm of liver and intrahepatic bile duct: Secondary | ICD-10-CM

## 2019-01-24 DIAGNOSIS — C7949 Secondary malignant neoplasm of other parts of nervous system: Secondary | ICD-10-CM

## 2019-01-24 DIAGNOSIS — Z170 Estrogen receptor positive status [ER+]: Secondary | ICD-10-CM | POA: Diagnosis not present

## 2019-01-24 DIAGNOSIS — G629 Polyneuropathy, unspecified: Secondary | ICD-10-CM

## 2019-01-24 DIAGNOSIS — I429 Cardiomyopathy, unspecified: Secondary | ICD-10-CM

## 2019-01-24 DIAGNOSIS — Z79899 Other long term (current) drug therapy: Secondary | ICD-10-CM

## 2019-01-24 DIAGNOSIS — D709 Neutropenia, unspecified: Secondary | ICD-10-CM | POA: Diagnosis not present

## 2019-01-24 DIAGNOSIS — C50412 Malignant neoplasm of upper-outer quadrant of left female breast: Secondary | ICD-10-CM

## 2019-01-24 DIAGNOSIS — M8580 Other specified disorders of bone density and structure, unspecified site: Secondary | ICD-10-CM

## 2019-01-24 DIAGNOSIS — Z86711 Personal history of pulmonary embolism: Secondary | ICD-10-CM

## 2019-01-24 DIAGNOSIS — I471 Supraventricular tachycardia: Secondary | ICD-10-CM

## 2019-01-24 DIAGNOSIS — Z7984 Long term (current) use of oral hypoglycemic drugs: Secondary | ICD-10-CM

## 2019-01-24 LAB — CBC WITH DIFFERENTIAL/PLATELET
Abs Immature Granulocytes: 0 10*3/uL (ref 0.00–0.07)
Basophils Absolute: 0 10*3/uL (ref 0.0–0.1)
Basophils Relative: 1 %
Eosinophils Absolute: 0.1 10*3/uL (ref 0.0–0.5)
Eosinophils Relative: 5 %
HCT: 42 % (ref 36.0–46.0)
Hemoglobin: 13.7 g/dL (ref 12.0–15.0)
Immature Granulocytes: 0 %
Lymphocytes Relative: 53 %
Lymphs Abs: 0.8 10*3/uL (ref 0.7–4.0)
MCH: 30.4 pg (ref 26.0–34.0)
MCHC: 32.6 g/dL (ref 30.0–36.0)
MCV: 93.1 fL (ref 80.0–100.0)
Monocytes Absolute: 0.2 10*3/uL (ref 0.1–1.0)
Monocytes Relative: 12 %
Neutro Abs: 0.4 10*3/uL — ABNORMAL LOW (ref 1.7–7.7)
Neutrophils Relative %: 29 %
Platelets: 145 10*3/uL — ABNORMAL LOW (ref 150–400)
RBC: 4.51 MIL/uL (ref 3.87–5.11)
RDW: 16.7 % — ABNORMAL HIGH (ref 11.5–15.5)
Smear Review: NORMAL
WBC: 1.4 10*3/uL — CL (ref 4.0–10.5)
nRBC: 0 % (ref 0.0–0.2)

## 2019-01-24 LAB — COMPREHENSIVE METABOLIC PANEL
ALT: 41 U/L (ref 0–44)
AST: 56 U/L — ABNORMAL HIGH (ref 15–41)
Albumin: 3.6 g/dL (ref 3.5–5.0)
Alkaline Phosphatase: 337 U/L — ABNORMAL HIGH (ref 38–126)
Anion gap: 6 (ref 5–15)
BUN: 11 mg/dL (ref 8–23)
CO2: 30 mmol/L (ref 22–32)
Calcium: 9 mg/dL (ref 8.9–10.3)
Chloride: 105 mmol/L (ref 98–111)
Creatinine, Ser: 0.74 mg/dL (ref 0.44–1.00)
GFR calc Af Amer: >60 mL/min (ref >60)
GFR calc non Af Amer: >60 mL/min (ref >60)
Glucose, Bld: 90 mg/dL (ref 70–99)
Potassium: 3.8 mmol/L (ref 3.5–5.1)
Sodium: 141 mmol/L (ref 135–145)
Total Bilirubin: 0.8 mg/dL (ref 0.3–1.2)
Total Protein: 7.3 g/dL (ref 6.5–8.1)

## 2019-01-24 MED ORDER — HEPARIN SOD (PORK) LOCK FLUSH 100 UNIT/ML IV SOLN
500.0000 [IU] | Freq: Once | INTRAVENOUS | Status: AC
  Administered 2019-01-24: 500 [IU] via INTRAVENOUS
  Filled 2019-01-24: qty 5

## 2019-01-24 MED ORDER — FILGRASTIM 480 MCG/1.6ML IJ SOLN
480.0000 ug | Freq: Once | INTRAMUSCULAR | Status: AC
  Administered 2019-01-24: 11:00:00 480 ug via SUBCUTANEOUS
  Filled 2019-01-24: qty 1.6

## 2019-01-24 MED ORDER — FILGRASTIM 480 MCG/0.8ML IJ SOSY
480.0000 ug | PREFILLED_SYRINGE | Freq: Once | INTRAMUSCULAR | Status: DC
  Filled 2019-01-24: qty 0.8

## 2019-01-24 MED ORDER — SODIUM CHLORIDE 0.9% FLUSH
10.0000 mL | Freq: Once | INTRAVENOUS | Status: AC
  Administered 2019-01-24: 10:00:00 10 mL via INTRAVENOUS
  Filled 2019-01-24: qty 10

## 2019-01-24 NOTE — Progress Notes (Signed)
DISCONTINUE OFF PATHWAY REGIMEN - Breast   [id number]:Eribulin 1.4 mg/m2 D1, 8 q21 Days:   A cycle is every 21 days:     Eribulin mesylate   **Always confirm dose/schedule in your pharmacy ordering system**  REASON: Toxicities / Adverse Event PRIOR TREATMENT: Off Pathway: Eribulin 1.4 mg/m2 D1, 8 q21 Days TREATMENT RESPONSE: Progressive Disease (PD)  START ON PATHWAY REGIMEN - Breast     A cycle is every 21 days:     Ixabepilone   **Always confirm dose/schedule in your pharmacy ordering system**  Patient Characteristics: Distant Metastases or Locoregional Recurrent Disease - Unresected or Locally Advanced Unresectable Disease Progressing after Neoadjuvant and Local Therapies, HER2 Negative/Unknown/Equivocal, ER Positive, Chemotherapy, Third Line and Beyond, Prior or  Contraindicated Anthracycline and Prior Eribulin Therapeutic Status: Distant Metastases BRCA Mutation Status: Absent ER Status: Positive (+) HER2 Status: Negative (-) PR Status: Negative (-) Line of Therapy: Third Line and Beyond Intent of Therapy: Non-Curative / Palliative Intent, Discussed with Patient

## 2019-01-24 NOTE — Progress Notes (Signed)
[facility] OFFICE PROGRESS NOTE  Patient Care Team: Steele Sizer, MD as PCP - General (Family Medicine) Wellington Hampshire, MD as PCP - Cardiology (Cardiology) Bary Castilla, Forest Gleason, MD (General Surgery) [name], MD (Inactive) as Attending Physician (Internal Medicine)  Cancer Staging No matching staging information was found for the patient.   Oncology History   # LEFT BREAST IDC; STAGE II [cT2N1] ER >90%; PR- 50-90%; her 2 NEU- POS; s/p Neoadj chemo; AUG 2016- TCH+P s/p Lumpec & partial ALND- path CR; s/p RT [finished Nov 2016];  adj Herceptin; HELD for Jan 19th 2017 [in DEC 2016-EF dropped from 63 to 51%; FEB 27th EF-42.9%]; JAN 2016 START Arimidex; May 2017- EF- 60%; May 24th 2017-Re-start Herceptin q 3W; July 28th STOP herceptin [finished 78m/m2; sec to Low EF; however 2017 Oct EF= 67%; improved]  # DEC 4th 2017- Start Neratinib 4 pills; DEC 11th 3 pills; STOPPED.  # FEB 2018- RECURRENCE ER/PR positive; Her 2 NEGATIVE [liver Bx]  # MARCH 1st 2018- Tax-Cytoxan [poor tol]  # FEB 2018- Brain mets [SBRT; Dr.Crystal; finished Feb 28th 2018]  # March 2018- Eribulin- poor tolerance  # June 8th 2018- Started Faslodex + Abema [abema-multiple interruptions sec to neutropenia]  # MRI Brain- March 2019- Leptomeningeal mets [WBRT; No LP s/p WBRT- 01/09/2018]; STOP Abema [5103mday-sec to severe cytopenia]+faslodex  # MAY 20tth 019- Start Xeloda; STOPPED in Nov 22nd 2019- sec to Progressive liver lesions on CT scan  # NOV 26th 2019- Faslodex + Piqaray 25035m; Jan 2020- Piqray 300 mg+ faslodex; FEB 17th CT scan- mixed response/ Overall progression; STOP Piqray + faslodex.   # FEB 25th 2020- ERIBULIN; Poor tolerance sec to severe neutropenia.   # PN- G-2; may 2017- Cymbalta 103m18mMARCH 2018-  acute vascular insuff of BIL LE [? Taxotere vasoconstriction on asprin]; # hemorragic shock LGIB [s/p colo; Dr.Wohl]  #  SVT- on flecainide.   # April 2016- Liver Bx- NEG;    # Drop in EF from Herceptin [recovered OCt 27th- EF 65%]  # BMD- jan 2017- osteopenia  -----------------------------------------------------------     [place] 4[place]- PDL-1 0%/NEG for BRCA; MSI-Stable; Positive for PI3K; ccnd-1; FGF** -----------------------------------------------------------  Dx: Metastatic Breast cancer [ER/PR-Pos; her -2 NEG] Stage IV; goals: palliative Current treatment- ERIBULIN.      Carcinoma of upper-outer quadrant of left breast in female, estrogen receptor positive (HCC)[place]2/25/2020 - 01/17/2019 Chemotherapy    The patient had eriBULin mesylate (HALAVEN) 2.1 mg in sodium chloride 0.9 % 100 mL chemo infusion, 1.2 mg/m2 = 2.1 mg (100 % of original dose 1.2 mg/m2), Intravenous,  Once, 3 of 4 cycles Dose modification: 1.2 mg/m2 (original dose 1.2 mg/m2, Cycle 1, Reason: Provider Judgment), 0.8 mg/m2 (original dose 1.2 mg/m2, Cycle 3, Reason: Provider Judgment) Administration: 2.1 mg (11/22/2018), 2.1 mg (11/29/2018), 2.1 mg (12/20/2018), 1.4 mg (01/17/2019)  for chemotherapy treatment.     01/24/2019 -  Chemotherapy    The patient had pegfilgrastim (NEULASTA ONPRO KIT) injection 6 mg, 6 mg, Subcutaneous, Once, 0 of 4 cycles ixabepilone (IXEMPRA) 72 mg in lactated ringers 250 mL chemo infusion, 40 mg/m2, Intravenous,  Once, 0 of 4 cycles  for chemotherapy treatment.      INTERVAL HISTORY:  Sarah [id number].  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  Patient having difficulty with her blood counts/severe neutropenia in spite of growth factor  support to get her eribulin.  Eribulin dose was decreased to 0.8g/m at last visit/1 week ago.  No nausea no vomiting.  No headaches.  Chronic tingling and numbness.  Review of Systems  Constitutional: Positive for malaise/fatigue. Negative for chills,  diaphoresis, fever and weight loss.  HENT: Negative for nosebleeds and sore throat.   Eyes: Negative for double vision.  Respiratory: Negative for cough, hemoptysis, sputum production, shortness of breath and wheezing.   Cardiovascular: Negative for chest pain, palpitations, orthopnea and leg swelling.  Gastrointestinal: Negative for abdominal pain, blood in stool, constipation, heartburn, melena, nausea and vomiting.  Genitourinary: Negative for dysuria, frequency and urgency.  Musculoskeletal: Positive for joint pain. Negative for back pain.  Skin: Negative for itching.  Neurological: Positive for tingling. Negative for dizziness, focal weakness, weakness and headaches.  Endo/Heme/Allergies: Does not bruise/bleed easily.  Psychiatric/Behavioral: Negative for depression. The patient is not nervous/anxious and does not have insomnia.       PAST MEDICAL HISTORY :  Past Medical History:  Diagnosis Date  . Chemotherapy-induced peripheral neuropathy ([place])   . Epistaxis    a. 11/2016 in setting of asa/plavix-->silver nitrate cauterization.  . GI bleed    a. 11/2016 Admission w/ GIB and hypovolemic shock req 3u PRBC's;  b. 11/2016 ECG: gastritis & nonbleeding peptic ulcer; c. 11/2016 Conlonoscopy: rectal and sigmoid colonic ulcers.  . Heart attack ([place])    a. 1998 Cath @ [facility]: reportedly no intervention required.  Marland Kitchen Herceptin-induced cardiomyopathy ([place])    a. In the setting of Herceptin Rx for breast cancer (initiated 12/2014); b. 03/2015 MUGA EF 64%; b. 08/2015 MUGA: EF 51%; c. 10/2015 MUGA: EF 44%; d. 11/2015 Echo: EF 45-50%; e. 01/2016 MUGA: EF 60%; f. 06/2016 MUGA EF 65%; g. 10/2016 MUGA: EF 61%;  h. 12/2016 Echo: EF 55-60%, gr1 DD.  Marland Kitchen Hyperlipidemia   . Hypertension   . Neuropathy   . Possible PAD (peripheral artery disease) ([place])    a. 11/2016 LE cyanosis and weak pulses-->CTA w/o significant Ao-BiFem dzs. ? distal dzs-->ASA/Plavix initiated by vascular surgery.  Marland Kitchen PSVT (paroxysmal  supraventricular tachycardia) ([place])    a. Dx 11/2016.  . Pulmonary embolism ([place])    a. 12/2016 CTA Chest: small nonocclusive PE in inferior segment of the Left lingula, somewhat eccentric filling defect suggesting chronic rather than acute embolic event; b. 04/4131 LE U/S:  No DVT; c. 12/2016 Echo: Nl RV fxn, nl PASP.  Marland Kitchen Recurrent Metastatic breast cancer ([place])    a. Dx 2016: Stage II, ER positive, PR positive, HER-2/neu overexpressing of the left breast-->chemo/radiation; b. 10/2016 CT Abd/pelvis: diffuse liver mets, ill defined sclerotic bone lesions-T12;  c. 10/2016 MRI brain: metastatic lesion along L temporal lobe (19x53m) w/ extensive surrounding edema & 533mmidline shift to right.  . Sinus tachycardia     PAST SURGICAL HISTORY :   Past Surgical History:  Procedure Laterality Date  . BREAST BIOPSY Left 2016   Positive  . BREAST LUMPECTOMY WITH SENTINEL LYMPH NODE BIOPSY Left 05/23/2015   Procedure: LEFT BREAST WIDE EXCISION WITH AXILLARY DISSECTION, MASTOPLASTY ;  Surgeon: JeRobert BellowMD;  Location: [facility] ORS;  Service: General;  Laterality: Left;  . BREAST SURGERY Left 12/18/14   breast biopsy/INVASIVE DUCTAL CARCINOMA OF BREAST, NOTTINGHAM GRADE 2.  . Marland KitchenREAST SURGERY  05/23/2015.   Wide excision/mastoplasty, axillary dissection. No residual invasive cancer, positive for residual DCIS. 0/2 nodes identified on axillary dissection. (no SLN by technetium or methylene blue)  . CARDIAC CATHETERIZATION    .  COLONOSCOPY WITH PROPOFOL N/A 12/10/2016   Procedure: COLONOSCOPY WITH PROPOFOL;  Surgeon: Lucilla Lame, MD;  Location: [facility] ENDOSCOPY;  Service: Endoscopy;  Laterality: N/A;  . COLONOSCOPY WITH PROPOFOL N/A 02/13/2017   Procedure: COLONOSCOPY WITH PROPOFOL;  Surgeon: Lucilla Lame, MD;  Location[facility] ENDOSCOPY;  Service: Endoscopy;  Laterality: N/A;  . ESOPHAGOGASTRODUODENOSCOPY (EGD) WITH PROPOFOL N/A 12/08/2016   Procedure: ESOPHAGOGASTRODUODENOSCOPY (EGD) WITH PROPOFOL;  Surgeon: Lucilla Lame, MD;  Location: [facility] ENDOSCOPY;  Service: Endoscopy;  Laterality: N/A;  . IVC FILTER INSERTION N/A 02/15/2017   Procedure: IVC Filter Insertion;  Surgeon: Algernon Huxley, MD;  Location: [place] CV LAB;  Service: Cardiovascular;  Laterality: N/A;  . PORTACATH PLACEMENT Right 12-31-14   Dr Bary Castilla    FAMILY HISTORY :   Family History  Problem Relation Age of Onset  . Breast cancer Maternal Aunt   . Breast cancer Cousin   . Brain cancer Maternal Uncle   . Diabetes Mother   . Hypertension Mother   . Stroke Mother     SOCIAL HISTORY:   Social History   Tobacco Use  . Smoking status: Light Tobacco Smoker    Packs/day: 0.50    Years: 18.00    Pack years: 9.00    Types: Cigarettes  . Smokeless tobacco: Never Used  Substance Use Topics  . Alcohol use: No    Alcohol/week: 0.0 standard drinks  . Drug use: No    ALLERGIES:  is allergic to no known allergies.  MEDICATIONS:  Current Outpatient Medications  Medication Sig Dispense Refill  . alpelisib (PIQRAY 300MG DAILY DOSE) 2 x 150 MG Therapy Pack Take 2 tablets (334m total) by mouth once daily with food. 60 each 4  . flecainide (TAMBOCOR) 50 MG tablet Take 1 tablet (50 mg total) by mouth 2 (two) times daily. 180 tablet 3  . levocetirizine (XYZAL) 5 MG tablet Take 1 tablet (5 mg total) by mouth every evening. 30 tablet 3  . magnesium oxide (MAG-OX) 400 MG tablet TAKE 1 TABLET BY MOUTH TWICE A DAY 60 tablet 6  . metFORMIN (GLUCOPHAGE) 500 MG tablet Take 1 tablet (500 mg total) by mouth 2 (two) times daily with a meal. 60 tablet 4  . pregabalin (LYRICA) 75 MG capsule Take 1 capsule (75 mg total) by mouth 2 (two) times daily. 180 capsule 0  . triamcinolone ointment (KENALOG) 0.5 % Apply 1 application topically 2 (two) times daily. 30 g 2  . Vitamin D, Ergocalciferol, (DRISDOL) 1.25 MG (50000 UT) CAPS capsule TAKE 1 CAPSULE (50,000 UNITS TOTAL) ONCE A WEEK BY MOUTH. 12 capsule 1   Current Facility-Administered Medications   Medication Dose Route Frequency Provider Last Rate Last Dose  . filgrastim (NEUPOGEN) injection 480 mcg  480 mcg Subcutaneous Once BCammie Sickle MD       Facility-Administered Medications Ordered in Other Visits  Medication Dose Route Frequency Provider Last Rate Last Dose  . 0.9 %  sodium chloride infusion   Intravenous Continuous Pandit, Sandeep, MD      . 0.9 %  sodium chloride infusion   Intravenous Continuous FLloyd Huger MD 999 mL/hr at 04/24/15 1520    . heparin lock flush 100 unit/mL  500 Units Intravenous Once Berenzon, Dmitriy, MD      . sodium chloride 0.9 % injection 10 mL  10 mL Intravenous PRN PLeia Alf MD   10 mL at 04/04/15 1440  . sodium chloride flush (NS) 0.9 % injection 10 mL  10 mL Intravenous  PRN Roxana Hires, MD        PHYSICAL EXAMINATION: ECOG PERFORMANCE STATUS: 1 - Symptomatic but completely ambulatory  BP (!) 144/88   Pulse 98   Temp 97.9 F (36.6 C) (Tympanic)   Resp 16   Wt 151 lb 14.4 oz (68.9 kg)   BMI 24.52 kg/m   Filed Weights   01/24/19 0943  Weight: 151 lb 14.4 oz (68.9 kg)    Physical Exam  Constitutional: She is oriented to person, place, and time and well-developed, well-nourished, and in no distress.  Alone.  Walking herself.  HENT:  Head: Normocephalic and atraumatic.  Mouth/Throat: Oropharynx is clear and moist. No oropharyngeal exudate.  Eyes: Pupils are equal, round, and reactive to light.  Neck: Normal range of motion. Neck supple.  Cardiovascular: Normal rate and regular rhythm.  Pulmonary/Chest: No respiratory distress. She has no wheezes.  Abdominal: Soft. Bowel sounds are normal. She exhibits no distension and no mass. There is no abdominal tenderness. There is no rebound and no guarding.  Musculoskeletal: Normal range of motion.        General: No tenderness or edema.  Neurological: She is alert and oriented to person, place, and time.  Skin: Skin is warm.  hyperpigmentation of the bilateral  hand and feet (better)  Psychiatric: Affect normal.       LABORATORY DATA:  I have reviewed the data as listed    Component Value Date/Time   NA 141 01/24/2019 0931   NA 135 03/04/2017 0918   NA 135 01/08/2015 0850   K 3.8 01/24/2019 0931   K 3.7 01/08/2015 0850   CL 105 01/24/2019 0931   CL 101 01/08/2015 0850   CO2 30 01/24/2019 0931   CO2 26 01/08/2015 0850   GLUCOSE 90 01/24/2019 0931   GLUCOSE 161 (H) 01/08/2015 0850   BUN 11 01/24/2019 0931   BUN 8 03/04/2017 0918   BUN 17 01/08/2015 0850   CREATININE 0.74 01/24/2019 0931   CREATININE 0.82 01/22/2015 1559   CALCIUM 9.0 01/24/2019 0931   CALCIUM 9.3 01/08/2015 0850   PROT 7.3 01/24/2019 0931   PROT 6.4 03/04/2017 0918   PROT 6.8 01/22/2015 1559   ALBUMIN 3.6 01/24/2019 0931   ALBUMIN 2.5 (L) 03/04/2017 0918   ALBUMIN 3.9 01/22/2015 1559   AST 56 (H) 01/24/2019 0931   AST 34 01/22/2015 1559   ALT 41 01/24/2019 0931   ALT 42 01/22/2015 1559   ALKPHOS 337 (H) 01/24/2019 0931   ALKPHOS 157 (H) 01/22/2015 1559   BILITOT 0.8 01/24/2019 0931   BILITOT 6.2 (H) 03/04/2017 0918   BILITOT 0.2 (L) 01/22/2015 1559   GFRNONAA >60 01/24/2019 0931   GFRNONAA >60 01/22/2015 1559   GFRAA >60 01/24/2019 0931   GFRAA >60 01/22/2015 1559    No results found for: SPEP, UPEP  Lab Results  Component Value Date   WBC 1.4 (LL) 01/24/2019   NEUTROABS 0.4 (L) 01/24/2019   HGB 13.7 01/24/2019   HCT 42.0 01/24/2019   MCV 93.1 01/24/2019   PLT 145 (L) 01/24/2019      Chemistry      Component Value Date/Time   NA 141 01/24/2019 0931   NA 135 03/04/2017 0918   NA 135 01/08/2015 0850   K 3.8 01/24/2019 0931   K 3.7 01/08/2015 0850   CL 105 01/24/2019 0931   CL 101 01/08/2015 0850   CO2 30 01/24/2019 0931   CO2 26 01/08/2015 0850   BUN 11 01/24/2019 0931  BUN 8 03/04/2017 0918   BUN 17 01/08/2015 0850   CREATININE 0.74 01/24/2019 0931   CREATININE 0.82 01/22/2015 1559      Component Value Date/Time   CALCIUM 9.0  01/24/2019 0931   CALCIUM 9.3 01/08/2015 0850   ALKPHOS 337 (H) 01/24/2019 0931   ALKPHOS 157 (H) 01/22/2015 1559   AST 56 (H) 01/24/2019 0931   AST 34 01/22/2015 1559   ALT 41 01/24/2019 0931   ALT 42 01/22/2015 1559   BILITOT 0.8 01/24/2019 0931   BILITOT 6.2 (H) 03/04/2017 0918   BILITOT 0.2 (L) 01/22/2015 1559       RADIOGRAPHIC STUDIES: I have personally reviewed the radiological images as listed and agreed with the findings in the report. No results found.   ASSESSMENT & PLAN:  Carcinoma of upper-outer quadrant of left breast in female, estrogen receptor positive ([place]) # Recurrent metastatic breast cancer-ER PR positive HER-2 negative; FEB 17th CT- mixed response in liver [few lesion smaller few larger]. Currently on Eribulin [started on 2/25]. ? Worsening.   # HOLD chemo today; sec to severe neutropenia. See plan below.  Even with dose reduced eribulin 0.8 mg/m.  We will plan to repeat a scan now. Will need to discontinue eribulin moving forward given neutropenia.  Discussed regarding Ixempra.  Discussed the potential side effects in detail-including but not limited to neuropathy low blood counts risk of infections.  After lengthy discussion weighing risk versus benefits patient agrees to proceed with treatment as discussed above.    #Neutropenia- not improved;  continue neupogen prophylaxis.  Patient will need growth factor support with ixempra.   #Elevated alkaline phosphatase- 2 ? Cancer vs medications.    Stable.   # Brain metastases-recurrent/likely leptomeningeal disease;  S/p WBRT [April 2019]. NOv 19th MRI stable-no progressive leptomeningeal disease. Stable.   #Peripheral neuropathy grade 1-  on Lyrica; stable.   # DISPOSITION: # HOLD Eribulin today # START NEUPOGEN today; daily x until 5/1.  # follow up in 2 weeks-cbc/cmp/MD-Ixempra [new]; onpro; CT C/A/P prior-- Dr.B  # 40 minutes face-to-face with the patient discussing the above plan of care; more than  50% of time spent on prognosis/ natural history; counseling and coordination.     Orders Placed This Encounter  Procedures  . CT CHEST W CONTRAST    Standing Status:   Future    Standing Expiration Date:   01/24/2020    Order Specific Question:   If indicated for the ordered procedure, I authorize the administration of contrast media per Radiology protocol    Answer:   Yes    Order Specific Question:   Preferred imaging location?    Answer:   [facility]    Order Specific Question:   Radiology Contrast Protocol - do NOT remove file path    Answer:   [email or web address]    Order Specific Question:   ** REASON FOR EXAM (FREE TEXT)    Answer:   breast cancer- metasstatic  . CT ABDOMEN PELVIS W CONTRAST    Standing Status:   Future    Standing Expiration Date:   01/24/2020    Order Specific Question:   If indicated for the ordered procedure, I authorize the administration of contrast media per Radiology protocol    Answer:   Yes    Order Specific Question:   Preferred imaging location?    Answer:   [facility]    Order Specific Question:   Radiology Contrast Protocol - do NOT remove file path  Answer:   [email or web address]    Order Specific Question:   ** REASON FOR EXAM (FREE TEXT)    Answer:   breast cnacer metastatic   All questions were answered. The patient knows to call the clinic with any problems, questions or concerns.      Cammie Sickle, MD 01/24/2019 12:19 PM

## 2019-01-24 NOTE — Progress Notes (Signed)
Patient does not offer any problems today.  

## 2019-01-24 NOTE — Assessment & Plan Note (Addendum)
#  Recurrent metastatic breast cancer-ER PR positive HER-2 negative; FEB 17th CT- mixed response in liver [few lesion smaller few larger]. Currently on Eribulin [started on 2/25]. ? Worsening.   # HOLD chemo today; sec to severe neutropenia. See plan below.  Even with dose reduced eribulin 0.8 mg/m.  We will plan to repeat a scan now. Will need to discontinue eribulin moving forward given neutropenia.  Discussed regarding Ixempra.  Discussed the potential side effects in detail-including but not limited to neuropathy low blood counts risk of infections.  After lengthy discussion weighing risk versus benefits patient agrees to proceed with treatment as discussed above.    #Neutropenia- not improved;  continue neupogen prophylaxis.  Patient will need growth factor support with ixempra.   #Elevated alkaline phosphatase- 2 ? Cancer vs medications.    Stable.   # Brain metastases-recurrent/likely leptomeningeal disease;  S/p WBRT [April 2019]. NOv 19th MRI stable-no progressive leptomeningeal disease. Stable.   #Peripheral neuropathy grade 1-  on Lyrica; stable.   # DISPOSITION: # HOLD Eribulin today # START NEUPOGEN today; daily x until 5/1.  # follow up in 2 weeks-cbc/cmp/MD-Ixempra [new]; onpro; CT C/A/P prior-- Dr.B  # 40 minutes face-to-face with the patient discussing the above plan of care; more than 50% of time spent on prognosis/ natural history; counseling and coordination.   

## 2019-01-25 ENCOUNTER — Inpatient Hospital Stay: Payer: Medicare HMO

## 2019-01-25 ENCOUNTER — Other Ambulatory Visit: Payer: Self-pay

## 2019-01-25 DIAGNOSIS — E785 Hyperlipidemia, unspecified: Secondary | ICD-10-CM | POA: Diagnosis not present

## 2019-01-25 DIAGNOSIS — C50412 Malignant neoplasm of upper-outer quadrant of left female breast: Secondary | ICD-10-CM

## 2019-01-25 DIAGNOSIS — F17210 Nicotine dependence, cigarettes, uncomplicated: Secondary | ICD-10-CM | POA: Diagnosis not present

## 2019-01-25 DIAGNOSIS — C787 Secondary malignant neoplasm of liver and intrahepatic bile duct: Secondary | ICD-10-CM | POA: Diagnosis not present

## 2019-01-25 DIAGNOSIS — C7949 Secondary malignant neoplasm of other parts of nervous system: Secondary | ICD-10-CM | POA: Diagnosis not present

## 2019-01-25 DIAGNOSIS — E860 Dehydration: Secondary | ICD-10-CM

## 2019-01-25 DIAGNOSIS — D709 Neutropenia, unspecified: Secondary | ICD-10-CM | POA: Diagnosis not present

## 2019-01-25 DIAGNOSIS — C7931 Secondary malignant neoplasm of brain: Secondary | ICD-10-CM | POA: Diagnosis not present

## 2019-01-25 DIAGNOSIS — Z170 Estrogen receptor positive status [ER+]: Secondary | ICD-10-CM | POA: Diagnosis not present

## 2019-01-25 DIAGNOSIS — G629 Polyneuropathy, unspecified: Secondary | ICD-10-CM | POA: Diagnosis not present

## 2019-01-25 MED ORDER — FILGRASTIM 480 MCG/0.8ML IJ SOSY
480.0000 ug | PREFILLED_SYRINGE | Freq: Once | INTRAMUSCULAR | Status: AC
  Administered 2019-01-25: 480 ug via SUBCUTANEOUS
  Filled 2019-01-25: qty 0.8

## 2019-01-26 ENCOUNTER — Inpatient Hospital Stay: Payer: Medicare HMO

## 2019-01-26 ENCOUNTER — Other Ambulatory Visit: Payer: Self-pay

## 2019-01-26 DIAGNOSIS — C50412 Malignant neoplasm of upper-outer quadrant of left female breast: Secondary | ICD-10-CM

## 2019-01-26 DIAGNOSIS — G629 Polyneuropathy, unspecified: Secondary | ICD-10-CM | POA: Diagnosis not present

## 2019-01-26 DIAGNOSIS — Z170 Estrogen receptor positive status [ER+]: Secondary | ICD-10-CM

## 2019-01-26 DIAGNOSIS — C7949 Secondary malignant neoplasm of other parts of nervous system: Secondary | ICD-10-CM | POA: Diagnosis not present

## 2019-01-26 DIAGNOSIS — C7931 Secondary malignant neoplasm of brain: Secondary | ICD-10-CM | POA: Diagnosis not present

## 2019-01-26 DIAGNOSIS — E785 Hyperlipidemia, unspecified: Secondary | ICD-10-CM | POA: Diagnosis not present

## 2019-01-26 DIAGNOSIS — C787 Secondary malignant neoplasm of liver and intrahepatic bile duct: Secondary | ICD-10-CM | POA: Diagnosis not present

## 2019-01-26 DIAGNOSIS — F17210 Nicotine dependence, cigarettes, uncomplicated: Secondary | ICD-10-CM | POA: Diagnosis not present

## 2019-01-26 DIAGNOSIS — E860 Dehydration: Secondary | ICD-10-CM

## 2019-01-26 DIAGNOSIS — D709 Neutropenia, unspecified: Secondary | ICD-10-CM | POA: Diagnosis not present

## 2019-01-26 MED ORDER — FILGRASTIM 480 MCG/0.8ML IJ SOSY
480.0000 ug | PREFILLED_SYRINGE | Freq: Once | INTRAMUSCULAR | Status: AC
  Administered 2019-01-26: 480 ug via SUBCUTANEOUS
  Filled 2019-01-26: qty 0.8

## 2019-01-27 ENCOUNTER — Inpatient Hospital Stay: Payer: Medicare HMO | Attending: Internal Medicine

## 2019-01-27 ENCOUNTER — Other Ambulatory Visit: Payer: Self-pay

## 2019-01-27 DIAGNOSIS — E785 Hyperlipidemia, unspecified: Secondary | ICD-10-CM | POA: Insufficient documentation

## 2019-01-27 DIAGNOSIS — Z7902 Long term (current) use of antithrombotics/antiplatelets: Secondary | ICD-10-CM | POA: Insufficient documentation

## 2019-01-27 DIAGNOSIS — Z170 Estrogen receptor positive status [ER+]: Secondary | ICD-10-CM | POA: Insufficient documentation

## 2019-01-27 DIAGNOSIS — R748 Abnormal levels of other serum enzymes: Secondary | ICD-10-CM | POA: Insufficient documentation

## 2019-01-27 DIAGNOSIS — I252 Old myocardial infarction: Secondary | ICD-10-CM | POA: Diagnosis not present

## 2019-01-27 DIAGNOSIS — C7931 Secondary malignant neoplasm of brain: Secondary | ICD-10-CM | POA: Insufficient documentation

## 2019-01-27 DIAGNOSIS — E860 Dehydration: Secondary | ICD-10-CM

## 2019-01-27 DIAGNOSIS — Z9221 Personal history of antineoplastic chemotherapy: Secondary | ICD-10-CM | POA: Insufficient documentation

## 2019-01-27 DIAGNOSIS — D701 Agranulocytosis secondary to cancer chemotherapy: Secondary | ICD-10-CM | POA: Insufficient documentation

## 2019-01-27 DIAGNOSIS — C787 Secondary malignant neoplasm of liver and intrahepatic bile duct: Secondary | ICD-10-CM | POA: Insufficient documentation

## 2019-01-27 DIAGNOSIS — M8580 Other specified disorders of bone density and structure, unspecified site: Secondary | ICD-10-CM | POA: Insufficient documentation

## 2019-01-27 DIAGNOSIS — Z86711 Personal history of pulmonary embolism: Secondary | ICD-10-CM | POA: Diagnosis not present

## 2019-01-27 DIAGNOSIS — F17210 Nicotine dependence, cigarettes, uncomplicated: Secondary | ICD-10-CM | POA: Insufficient documentation

## 2019-01-27 DIAGNOSIS — C50412 Malignant neoplasm of upper-outer quadrant of left female breast: Secondary | ICD-10-CM

## 2019-01-27 DIAGNOSIS — T451X5A Adverse effect of antineoplastic and immunosuppressive drugs, initial encounter: Secondary | ICD-10-CM | POA: Insufficient documentation

## 2019-01-27 DIAGNOSIS — Z79899 Other long term (current) drug therapy: Secondary | ICD-10-CM | POA: Diagnosis not present

## 2019-01-27 DIAGNOSIS — C7949 Secondary malignant neoplasm of other parts of nervous system: Secondary | ICD-10-CM | POA: Insufficient documentation

## 2019-01-27 DIAGNOSIS — I10 Essential (primary) hypertension: Secondary | ICD-10-CM | POA: Diagnosis not present

## 2019-01-27 MED ORDER — FILGRASTIM 480 MCG/0.8ML IJ SOSY
480.0000 ug | PREFILLED_SYRINGE | Freq: Once | INTRAMUSCULAR | Status: AC
  Administered 2019-01-27: 480 ug via SUBCUTANEOUS
  Filled 2019-01-27: qty 0.8

## 2019-02-03 ENCOUNTER — Other Ambulatory Visit: Payer: Self-pay

## 2019-02-03 ENCOUNTER — Ambulatory Visit
Admission: RE | Admit: 2019-02-03 | Discharge: 2019-02-03 | Disposition: A | Discharge status: Home / Self Care | Payer: Medicare HMO | Source: Ambulatory Visit | Attending: Internal Medicine | Admitting: Internal Medicine

## 2019-02-03 DIAGNOSIS — Z170 Estrogen receptor positive status [ER+]: Secondary | ICD-10-CM | POA: Insufficient documentation

## 2019-02-03 DIAGNOSIS — C50919 Malignant neoplasm of unspecified site of unspecified female breast: Secondary | ICD-10-CM | POA: Diagnosis not present

## 2019-02-03 DIAGNOSIS — K7689 Other specified diseases of liver: Secondary | ICD-10-CM | POA: Diagnosis not present

## 2019-02-03 DIAGNOSIS — C50412 Malignant neoplasm of upper-outer quadrant of left female breast: Secondary | ICD-10-CM | POA: Insufficient documentation

## 2019-02-03 DIAGNOSIS — R918 Other nonspecific abnormal finding of lung field: Secondary | ICD-10-CM | POA: Diagnosis not present

## 2019-02-03 MED ORDER — IOHEXOL 300 MG/ML  SOLN
85.0000 mL | Freq: Once | INTRAMUSCULAR | Status: AC | PRN
  Administered 2019-02-03: 85 mL via INTRAVENOUS

## 2019-02-07 ENCOUNTER — Inpatient Hospital Stay: Payer: Medicare HMO

## 2019-02-07 ENCOUNTER — Ambulatory Visit: Payer: Medicare HMO

## 2019-02-07 ENCOUNTER — Other Ambulatory Visit: Payer: Medicare HMO

## 2019-02-07 ENCOUNTER — Inpatient Hospital Stay: Payer: Medicare HMO | Admitting: *Deleted

## 2019-02-07 ENCOUNTER — Other Ambulatory Visit: Payer: Self-pay

## 2019-02-07 ENCOUNTER — Inpatient Hospital Stay (HOSPITAL_BASED_OUTPATIENT_CLINIC_OR_DEPARTMENT_OTHER): Payer: Medicare HMO | Admitting: Internal Medicine

## 2019-02-07 ENCOUNTER — Ambulatory Visit: Payer: Medicare HMO | Admitting: Internal Medicine

## 2019-02-07 VITALS — BP 136/77 | HR 114 | Temp 97.0°F | Resp 18 | Ht 66.0 in | Wt 151.0 lb

## 2019-02-07 DIAGNOSIS — Z170 Estrogen receptor positive status [ER+]: Secondary | ICD-10-CM

## 2019-02-07 DIAGNOSIS — I10 Essential (primary) hypertension: Secondary | ICD-10-CM

## 2019-02-07 DIAGNOSIS — C50412 Malignant neoplasm of upper-outer quadrant of left female breast: Secondary | ICD-10-CM | POA: Diagnosis not present

## 2019-02-07 DIAGNOSIS — D701 Agranulocytosis secondary to cancer chemotherapy: Secondary | ICD-10-CM

## 2019-02-07 DIAGNOSIS — Z95828 Presence of other vascular implants and grafts: Secondary | ICD-10-CM

## 2019-02-07 DIAGNOSIS — C7949 Secondary malignant neoplasm of other parts of nervous system: Secondary | ICD-10-CM

## 2019-02-07 DIAGNOSIS — Z79899 Other long term (current) drug therapy: Secondary | ICD-10-CM

## 2019-02-07 DIAGNOSIS — I252 Old myocardial infarction: Secondary | ICD-10-CM

## 2019-02-07 DIAGNOSIS — Z9221 Personal history of antineoplastic chemotherapy: Secondary | ICD-10-CM

## 2019-02-07 DIAGNOSIS — R748 Abnormal levels of other serum enzymes: Secondary | ICD-10-CM

## 2019-02-07 DIAGNOSIS — C7931 Secondary malignant neoplasm of brain: Secondary | ICD-10-CM | POA: Diagnosis not present

## 2019-02-07 DIAGNOSIS — Z86711 Personal history of pulmonary embolism: Secondary | ICD-10-CM

## 2019-02-07 DIAGNOSIS — Z7902 Long term (current) use of antithrombotics/antiplatelets: Secondary | ICD-10-CM

## 2019-02-07 DIAGNOSIS — E785 Hyperlipidemia, unspecified: Secondary | ICD-10-CM

## 2019-02-07 DIAGNOSIS — M8580 Other specified disorders of bone density and structure, unspecified site: Secondary | ICD-10-CM

## 2019-02-07 DIAGNOSIS — C787 Secondary malignant neoplasm of liver and intrahepatic bile duct: Secondary | ICD-10-CM

## 2019-02-07 DIAGNOSIS — F17210 Nicotine dependence, cigarettes, uncomplicated: Secondary | ICD-10-CM

## 2019-02-07 DIAGNOSIS — T451X5A Adverse effect of antineoplastic and immunosuppressive drugs, initial encounter: Secondary | ICD-10-CM | POA: Diagnosis not present

## 2019-02-07 LAB — COMPREHENSIVE METABOLIC PANEL
ALT: 33 U/L (ref 0–44)
AST: 44 U/L — ABNORMAL HIGH (ref 15–41)
Albumin: 3.8 g/dL (ref 3.5–5.0)
Alkaline Phosphatase: 251 U/L — ABNORMAL HIGH (ref 38–126)
Anion gap: 7 (ref 5–15)
BUN: 18 mg/dL (ref 8–23)
CO2: 28 mmol/L (ref 22–32)
Calcium: 9 mg/dL (ref 8.9–10.3)
Chloride: 104 mmol/L (ref 98–111)
Creatinine, Ser: 0.65 mg/dL (ref 0.44–1.00)
GFR calc Af Amer: >60 mL/min (ref >60)
GFR calc non Af Amer: >60 mL/min (ref >60)
Glucose, Bld: 73 mg/dL (ref 70–99)
Potassium: 3.9 mmol/L (ref 3.5–5.1)
Sodium: 139 mmol/L (ref 135–145)
Total Bilirubin: 1.1 mg/dL (ref 0.3–1.2)
Total Protein: 7.4 g/dL (ref 6.5–8.1)

## 2019-02-07 LAB — CBC WITH DIFFERENTIAL/PLATELET
Abs Immature Granulocytes: 0 K/uL (ref 0.00–0.07)
Basophils Absolute: 0 K/uL (ref 0.0–0.1)
Basophils Relative: 1 %
Eosinophils Absolute: 0 K/uL (ref 0.0–0.5)
Eosinophils Relative: 2 %
HCT: 43 % (ref 36.0–46.0)
Hemoglobin: 14.1 g/dL (ref 12.0–15.0)
Immature Granulocytes: 0 %
Lymphocytes Relative: 45 %
Lymphs Abs: 0.6 K/uL — ABNORMAL LOW (ref 0.7–4.0)
MCH: 30.3 pg (ref 26.0–34.0)
MCHC: 32.8 g/dL (ref 30.0–36.0)
MCV: 92.5 fL (ref 80.0–100.0)
Monocytes Absolute: 0.2 K/uL (ref 0.1–1.0)
Monocytes Relative: 13 %
Neutro Abs: 0.6 K/uL — ABNORMAL LOW (ref 1.7–7.7)
Neutrophils Relative %: 39 %
Platelets: 211 K/uL (ref 150–400)
RBC: 4.65 MIL/uL (ref 3.87–5.11)
RDW: 16.3 % — ABNORMAL HIGH (ref 11.5–15.5)
WBC: 1.4 K/uL — CL (ref 4.0–10.5)
nRBC: 0 % (ref 0.0–0.2)

## 2019-02-07 MED ORDER — HEPARIN SOD (PORK) LOCK FLUSH 100 UNIT/ML IV SOLN
500.0000 [IU] | Freq: Once | INTRAVENOUS | Status: AC
  Administered 2019-02-07: 500 [IU]
  Filled 2019-02-07: qty 5

## 2019-02-07 MED ORDER — SODIUM CHLORIDE 0.9% FLUSH
10.0000 mL | Freq: Once | INTRAVENOUS | Status: AC
  Administered 2019-02-07: 08:00:00 10 mL via INTRAVENOUS
  Filled 2019-02-07: qty 10

## 2019-02-07 NOTE — Assessment & Plan Note (Addendum)
#  Recurrent metastatic breast cancer-ER PR positive HER-2 negative; Currently on Eribulin [started on 2/25].  May 2020- CT- new 2cm liver-otherwise stable/treated liver lesions; lung lesions- improved/ inflammatory.  #Hold Ixempra given the continued severe neutropenia ANC 0.6.  Given the continued severe bone marrow suppression-and the lack of any significant progressive disease [except for 2 cm lesion in the left lobe of the liver]-I think it is reasonable to hold systemic therapy at this time  # " New/progressive" 2 cm left lower lobe lesion-recommend MRI for further evaluation.  Consider ablation.  Discussed with the patient she is in agreement.  #Severe neutropenia-ANC 600; eribulin.  Patient is asymptomatic today.  Hold off growth factor support.   #Elevated alkaline phosphatase- 2 ? Cancer vs medications.    Stable.   # Brain metastases-recurrent/likely leptomeningeal disease;  S/p WBRT [April 2019]. NOv 19th MRI stable-no progressive leptomeningeal disease.  Clinically stable  #Peripheral neuropathy grade 1-  on Lyrica; stable  # DISPOSITION: # HOLD chemo today; de-access # MRI liver ASAP.  # follow up in 3 weeks-MD-labs- cbc/cmpIxempra [new]; onpro-- Dr.B  # I reviewed the blood work- with the patient in detail; also reviewed the imaging independently [as summarized above]; and with the patient in detail.   # 40 minutes face-to-face with the patient discussing the above plan of care; more than 50% of time spent on prognosis/ natural history; counseling and coordination.

## 2019-02-07 NOTE — Progress Notes (Signed)
[facility] OFFICE PROGRESS NOTE  Patient Care Team: Steele Sizer, MD as PCP - General (Family Medicine) Wellington Hampshire, MD as PCP - Cardiology (Cardiology) Bary Castilla, Forest Gleason, MD (General Surgery) [name], MD (Inactive) as Attending Physician (Internal Medicine)  Cancer Staging No matching staging information was found for the patient.   Oncology History   # LEFT BREAST IDC; STAGE II [cT2N1] ER >90%; PR- 50-90%; her 2 NEU- POS; s/p Neoadj chemo; AUG 2016- TCH+P s/p Lumpec & partial ALND- path CR; s/p RT [finished Nov 2016];  adj Herceptin; HELD for Jan 19th 2017 [in DEC 2016-EF dropped from 63 to 51%; FEB 27th EF-42.9%]; JAN 2016 START Arimidex; May 2017- EF- 60%; May 24th 2017-Re-start Herceptin q 3W; July 28th STOP herceptin [finished 32m/m2; sec to Low EF; however 2017 Oct EF= 67%; improved]  # DEC 4th 2017- Start Neratinib 4 pills; DEC 11th 3 pills; STOPPED.  # FEB 2018- RECURRENCE ER/PR positive; Her 2 NEGATIVE [liver Bx]  # MARCH 1st 2018- Tax-Cytoxan [poor tol]  # FEB 2018- Brain mets [SBRT; Dr.Crystal; finished Feb 28th 2018]  # March 2018- Eribulin- poor tolerance  # June 8th 2018- Started Faslodex + Abema [abema-multiple interruptions sec to neutropenia]  # MRI Brain- March 2019- Leptomeningeal mets [WBRT; No LP s/p WBRT- 01/09/2018]; STOP Abema [567mday-sec to severe cytopenia]+faslodex  # MAY 20tth 019- Start Xeloda; STOPPED in Nov 22nd 2019- sec to Progressive liver lesions on CT scan  # NOV 26th 2019- Faslodex + Piqaray 25053m; Jan 2020- Piqray 300 mg+ faslodex; FEB 17th CT scan- mixed response/ Overall progression; STOP Piqray + faslodex.   # FEB 25th 2020- ERIBULIN; Poor tolerance sec to severe neutropenia.  Stopped May 2020.  CT scan May 2020-new/progressive 2 cm liver lesion;   #May 2020 Ixempra-awaiting improvement of counts  # PN- G-2; may 2017- Cymbalta 68m58mMARCH 2018-  acute vascular insuff of BIL LE [? Taxotere  vasoconstriction on asprin]; # hemorragic shock LGIB [s/p colo; Dr.Wohl]  #  SVT- on flecainide.   # April 2016- Liver Bx- NEG;   # Drop in EF from Herceptin [recovered OCt 27th- EF 65%]  # BMD- jan 2017- osteopenia  -----------------------------------------------------------     [place] Sep 4th 2018- PDL-1 0%/NEG for BRCA; MSI-Stable; Positive for PI3K; ccnd-1; FGF** -----------------------------------------------------------  Dx: Metastatic Breast cancer [ER/PR-Pos; her -2 NEG] Stage IV; goals: palliative Current treatment-holding chemotherapy     Carcinoma of upper-outer quadrant of left breast in female, estrogen receptor positive (HCC)Anon [place]2/25/2020 - 01/17/2019 Chemotherapy    The patient had eriBULin mesylate (HALAVEN) 2.1 mg in sodium chloride 0.9 % 100 mL chemo infusion, 1.2 mg/m2 = 2.1 mg (100 % of original dose 1.2 mg/m2), Intravenous,  Once, 3 of 4 cycles Dose modification: 1.2 mg/m2 (original dose 1.2 mg/m2, Cycle 1, Reason: Provider Judgment), 0.8 mg/m2 (original dose 1.2 mg/m2, Cycle 3, Reason: Provider Judgment) Administration: 2.1 mg (11/22/2018), 2.1 mg (11/29/2018), 2.1 mg (12/20/2018), 1.4 mg (01/17/2019)  for chemotherapy treatment.     02/07/2019 -  Chemotherapy    The patient had pegfilgrastim (NEULASTA ONPRO KIT) injection 6 mg, 6 mg, Subcutaneous, Once, 0 of 4 cycles ixabepilone (IXEMPRA) 72 mg in lactated ringers 250 mL chemo infusion, 40 mg/m2, Intravenous,  Once, 0 of 4 cycles  for chemotherapy treatment.      INTERVAL HISTORY:  BrenLILO WALLINGTONy28.  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  Patient denies any fevers or chills.  Appetite is fair.  No weight loss.  No nausea no vomiting.  Chronic tingling and numbness of extremities.   Review of  Systems  Constitutional: Positive for malaise/fatigue. Negative for chills, diaphoresis, fever and weight loss.  HENT: Negative for nosebleeds and sore throat.   Eyes: Negative for double vision.  Respiratory: Negative for cough, hemoptysis, sputum production, shortness of breath and wheezing.   Cardiovascular: Negative for chest pain, palpitations, orthopnea and leg swelling.  Gastrointestinal: Negative for abdominal pain, blood in stool, constipation, heartburn, melena, nausea and vomiting.  Genitourinary: Negative for dysuria, frequency and urgency.  Musculoskeletal: Positive for joint pain. Negative for back pain.  Skin: Negative for itching.  Neurological: Positive for tingling. Negative for dizziness, focal weakness, weakness and headaches.  Endo/Heme/Allergies: Does not bruise/bleed easily.  Psychiatric/Behavioral: Negative for depression. The patient is not nervous/anxious and does not have insomnia.       PAST MEDICAL HISTORY :  Past Medical History:  Diagnosis Date  . Chemotherapy-induced peripheral neuropathy ([place])   . Epistaxis    a. 11/2016 in setting of asa/plavix-->silver nitrate cauterization.  . GI bleed    a. 11/2016 Admission w/ GIB and hypovolemic shock req 3u PRBC's;  b. 11/2016 ECG: gastritis & nonbleeding peptic ulcer; c. 11/2016 Conlonoscopy: rectal and sigmoid colonic ulcers.  . Heart attack ([place])    a. 1998 Cath @ [facility]: reportedly no intervention required.  Marland Kitchen Herceptin-induced cardiomyopathy ([place])    a. In the setting of Herceptin Rx for breast cancer (initiated 12/2014); b. 03/2015 MUGA EF 64%; b. 08/2015 MUGA: EF 51%; c. 10/2015 MUGA: EF 44%; d. 11/2015 Echo: EF 45-50%; e. 01/2016 MUGA: EF 60%; f. 06/2016 MUGA EF 65%; g. 10/2016 MUGA: EF 61%;  h. 12/2016 Echo: EF 55-60%, gr1 DD.  Marland Kitchen Hyperlipidemia   . Hypertension   . Neuropathy   . Possible PAD (peripheral artery disease) ([place])    a. 11/2016 LE cyanosis and weak pulses-->CTA w/o significant Ao-BiFem dzs. ? distal  dzs-->ASA/Plavix initiated by vascular surgery.  Marland Kitchen PSVT (paroxysmal supraventricular tachycardia) ([place])    a. Dx 11/2016.  . Pulmonary embolism ([place])    a. 12/2016 CTA Chest: small nonocclusive PE in inferior segment of the Left lingula, somewhat eccentric filling defect suggesting chronic rather than acute embolic event; b. 10/9516 LE U/S:  No DVT; c. 12/2016 Echo: Nl RV fxn, nl PASP.  Marland Kitchen Recurrent Metastatic breast cancer ([place])    a. Dx 2016: Stage II, ER positive, PR positive, HER-2/neu overexpressing of the left breast-->chemo/radiation; b. 10/2016 CT Abd/pelvis: diffuse liver mets, ill defined sclerotic bone lesions-T12;  c. 10/2016 MRI brain: metastatic lesion along L temporal lobe (19x5m) w/ extensive surrounding edema & 518mmidline shift to right.  . Sinus tachycardia     PAST SURGICAL HISTORY :   Past Surgical History:  Procedure Laterality Date  . BREAST BIOPSY Left 2016   Positive  . BREAST LUMPECTOMY WITH SENTINEL LYMPH NODE BIOPSY Left 05/23/2015   Procedure: LEFT BREAST WIDE EXCISION WITH AXILLARY DISSECTION, MASTOPLASTY ;  Surgeon: JeRobert BellowMD;  Location: [facility] ORS;  Service: General;  Laterality: Left;  . BREAST SURGERY Left 12/18/14   breast biopsy/INVASIVE DUCTAL CARCINOMA OF BREAST, NOTTINGHAM GRADE 2.  . Marland KitchenREAST SURGERY  05/23/2015.   Wide excision/mastoplasty, axillary dissection. No residual invasive cancer, positive for residual DCIS. 0/2 nodes identified on axillary  dissection. (no SLN by technetium or methylene blue)  . CARDIAC CATHETERIZATION    . COLONOSCOPY WITH PROPOFOL N/A 12/10/2016   Procedure: COLONOSCOPY WITH PROPOFOL;  Surgeon: Lucilla Lame, MD;  Location: [facility] ENDOSCOPY;  Service: Endoscopy;  Laterality: N/A;  . COLONOSCOPY WITH PROPOFOL N/A 02/13/2017   Procedure: COLONOSCOPY WITH PROPOFOL;  Surgeon: Lucilla Lame, MD;  Location: [facility] ENDOSCOPY;  Service: Endoscopy;  Laterality: N/A;  . ESOPHAGOGASTRODUODENOSCOPY (EGD) WITH PROPOFOL N/A 12/08/2016    Procedure: ESOPHAGOGASTRODUODENOSCOPY (EGD) WITH PROPOFOL;  Surgeon: Lucilla Lame, MD;  Location: [facility] ENDOSCOPY;  Service: Endoscopy;  Laterality: N/A;  . IVC FILTER INSERTION N/A 02/15/2017   Procedure: IVC Filter Insertion;  Surgeon: Algernon Huxley, MD;  Location: [facility];  Service: Cardiovascular;  Laterality: N/A;  . PORTACATH PLACEMENT Right 12-31-14   Dr Bary Castilla    FAMILY HISTORY :   Family History  Problem Relation Age of Onset  . Breast cancer Maternal Aunt   . Breast cancer Cousin   . Brain cancer Maternal Uncle   . Diabetes Mother   . Hypertension Mother   . Stroke Mother     SOCIAL HISTORY:   Social History   Tobacco Use  . Smoking status: Light Tobacco Smoker    Packs/day: 0.50    Years: 18.00    Pack years: 9.00    Types: Cigarettes  . Smokeless tobacco: Never Used  Substance Use Topics  . Alcohol use: No    Alcohol/week: 0.0 standard drinks  . Drug use: No    ALLERGIES:  is allergic to no known allergies.  MEDICATIONS:  Current Outpatient Medications  Medication Sig Dispense Refill  . flecainide (TAMBOCOR) 50 MG tablet Take 1 tablet (50 mg total) by mouth 2 (two) times daily. 180 tablet 3  . magnesium oxide (MAG-OX) 400 MG tablet TAKE 1 TABLET BY MOUTH TWICE A DAY 60 tablet 6  . pregabalin (LYRICA) 75 MG capsule Take 1 capsule (75 mg total) by mouth 2 (two) times daily. 180 capsule 0  . triamcinolone ointment (KENALOG) 0.5 % Apply 1 application topically 2 (two) times daily. 30 g 2  . Vitamin D, Ergocalciferol, (DRISDOL) 1.25 MG (50000 UT) CAPS capsule TAKE 1 CAPSULE (50,000 UNITS TOTAL) ONCE A WEEK BY MOUTH. 12 capsule 1   No current facility-administered medications for this visit.    Facility-Administered Medications Ordered in Other Visits  Medication Dose Route Frequency Provider Last Rate Last Dose  . 0.9 %  sodium chloride infusion   Intravenous Continuous Pandit, Sandeep, MD      . 0.9 %  sodium chloride infusion   Intravenous Continuous  Lloyd Huger, MD 999 mL/hr at 04/24/15 1520    . heparin lock flush 100 unit/mL  500 Units Intravenous Once Berenzon, Dmitriy, MD      . heparin lock flush 100 unit/mL  500 Units Intracatheter Once Charlaine Dalton R, MD      . sodium chloride 0.9 % injection 10 mL  10 mL Intravenous PRN [name], MD   10 mL at 04/04/15 1440  . sodium chloride flush (NS) 0.9 % injection 10 mL  10 mL Intravenous PRN Berenzon, Dmitriy, MD        PHYSICAL EXAMINATION: ECOG PERFORMANCE STATUS: 1 - Symptomatic but completely ambulatory  BP 136/77 (BP Location: Right Arm, Patient Position: Sitting, Cuff Size: Normal)   Pulse (!) 114   Temp (!) 97 F (36.1 C) (Tympanic)   Resp 18   Ht 5' 6"  (1.676 m)  Wt 151 lb (68.5 kg)   BMI 24.37 kg/m   Filed Weights   02/07/19 0833  Weight: 151 lb (68.5 kg)    Physical Exam  Constitutional: She is oriented to person, place, and time and well-developed, well-nourished, and in no distress.  Alone.  Walking herself.  HENT:  Head: Normocephalic and atraumatic.  Mouth/Throat: Oropharynx is clear and moist. No oropharyngeal exudate.  Eyes: Pupils are equal, round, and reactive to light.  Neck: Normal range of motion. Neck supple.  Cardiovascular: Normal rate and regular rhythm.  Pulmonary/Chest: No respiratory distress. She has no wheezes.  Abdominal: Soft. Bowel sounds are normal. She exhibits no distension and no mass. There is no abdominal tenderness. There is no rebound and no guarding.  Musculoskeletal: Normal range of motion.        General: No tenderness or edema.  Neurological: She is alert and oriented to person, place, and time.  Skin: Skin is warm.  hyperpigmentation of the bilateral hand and feet (better)  Psychiatric: Affect normal.       LABORATORY DATA:  I have reviewed the data as listed    Component Value Date/Time   NA 139 02/07/2019 0813   NA 135 03/04/2017 0918   NA 135 01/08/2015 0850   K 3.9 02/07/2019 0813   K 3.7  01/08/2015 0850   CL 104 02/07/2019 0813   CL 101 01/08/2015 0850   CO2 28 02/07/2019 0813   CO2 26 01/08/2015 0850   GLUCOSE 73 02/07/2019 0813   GLUCOSE 161 (H) 01/08/2015 0850   BUN 18 02/07/2019 0813   BUN 8 03/04/2017 0918   BUN 17 01/08/2015 0850   CREATININE 0.65 02/07/2019 0813   CREATININE 0.82 01/22/2015 1559   CALCIUM 9.0 02/07/2019 0813   CALCIUM 9.3 01/08/2015 0850   PROT 7.4 02/07/2019 0813   PROT 6.4 03/04/2017 0918   PROT 6.8 01/22/2015 1559   ALBUMIN 3.8 02/07/2019 0813   ALBUMIN 2.5 (L) 03/04/2017 0918   ALBUMIN 3.9 01/22/2015 1559   AST 44 (H) 02/07/2019 0813   AST 34 01/22/2015 1559   ALT 33 02/07/2019 0813   ALT 42 01/22/2015 1559   ALKPHOS 251 (H) 02/07/2019 0813   ALKPHOS 157 (H) 01/22/2015 1559   BILITOT 1.1 02/07/2019 0813   BILITOT 6.2 (H) 03/04/2017 0918   BILITOT 0.2 (L) 01/22/2015 1559   GFRNONAA >60 02/07/2019 0813   GFRNONAA >60 01/22/2015 1559   GFRAA >60 02/07/2019 0813   GFRAA >60 01/22/2015 1559    No results found for: SPEP, UPEP  Lab Results  Component Value Date   WBC 1.4 (LL) 02/07/2019   NEUTROABS 0.6 (L) 02/07/2019   HGB 14.1 02/07/2019   HCT 43.0 02/07/2019   MCV 92.5 02/07/2019   PLT 211 02/07/2019      Chemistry      Component Value Date/Time   NA 139 02/07/2019 0813   NA 135 03/04/2017 0918   NA 135 01/08/2015 0850   K 3.9 02/07/2019 0813   K 3.7 01/08/2015 0850   CL 104 02/07/2019 0813   CL 101 01/08/2015 0850   CO2 28 02/07/2019 0813   CO2 26 01/08/2015 0850   BUN 18 02/07/2019 0813   BUN 8 03/04/2017 0918   BUN 17 01/08/2015 0850   CREATININE 0.65 02/07/2019 0813   CREATININE 0.82 01/22/2015 1559      Component Value Date/Time   CALCIUM 9.0 02/07/2019 0813   CALCIUM 9.3 01/08/2015 0850   ALKPHOS 251 (H) 02/07/2019  0813   ALKPHOS 157 (H) 01/22/2015 1559   AST 44 (H) 02/07/2019 0813   AST 34 01/22/2015 1559   ALT 33 02/07/2019 0813   ALT 42 01/22/2015 1559   BILITOT 1.1 02/07/2019 0813   BILITOT  6.2 (H) 03/04/2017 0918   BILITOT 0.2 (L) 01/22/2015 1559       RADIOGRAPHIC STUDIES: I have personally reviewed the radiological images as listed and agreed with the findings in the report. No results found.   ASSESSMENT & PLAN:  Carcinoma of upper-outer quadrant of left breast in female, estrogen receptor positive ([place]) # Recurrent metastatic breast cancer-ER PR positive HER-2 negative; Currently on Eribulin [started on 2/25].  May 2020- CT- new 2cm liver-otherwise stable/treated liver lesions; lung lesions- improved/ inflammatory.  #Hold Ixempra given the continued severe neutropenia ANC 0.6.  Given the continued severe bone marrow suppression-and the lack of any significant progressive disease [except for 2 cm lesion in the left lobe of the liver]-I think it is reasonable to hold systemic therapy at this time  # " New/progressive" 2 cm left lower lobe lesion-recommend MRI for further evaluation.  Consider ablation.  Discussed with the patient she is in agreement.  #Severe neutropenia-ANC 600; eribulin.  Patient is asymptomatic today.  Hold off growth factor support.   #Elevated alkaline phosphatase- 2 ? Cancer vs medications.    Stable.   # Brain metastases-recurrent/likely leptomeningeal disease;  S/p WBRT [April 2019]. NOv 19th MRI stable-no progressive leptomeningeal disease.  Clinically stable  #Peripheral neuropathy grade 1-  on Lyrica; stable  # DISPOSITION: # HOLD chemo today; de-access # MRI liver ASAP.  # follow up in 3 weeks-MD-labs- cbc/cmpIxempra [new]; onpro-- Dr.B  # I reviewed the blood work- with the patient in detail; also reviewed the imaging independently [as summarized above]; and with the patient in detail.   # 40 minutes face-to-face with the patient discussing the above plan of care; more than 50% of time spent on prognosis/ natural history; counseling and coordination.    Orders Placed This Encounter  Procedures  . MR LIVER W WO CONTRAST    Standing  Status:   Future    Standing Expiration Date:   04/08/2020    Order Specific Question:   ** REASON FOR EXAM (FREE TEXT)    Answer:   metastatic breast cancer to liver- evaluaion for possible liver ablation.    Order Specific Question:   If indicated for the ordered procedure, I authorize the administration of contrast media per Radiology protocol    Answer:   Yes    Order Specific Question:   What is the patient's sedation requirement?    Answer:   No Sedation    Order Specific Question:   Does the patient have a pacemaker or implanted devices?    Answer:   No    Order Specific Question:   Radiology Contrast Protocol - do NOT remove file path    Answer:   [email or web address]    Order Specific Question:   Preferred imaging location?    Answer:   [facility] (table limit-400lbs)   All questions were answered. The patient knows to call the clinic with any problems, questions or concerns.      Cammie Sickle, MD 02/07/2019 9:15 AM

## 2019-02-09 ENCOUNTER — Ambulatory Visit
Admission: RE | Admit: 2019-02-09 | Discharge: 2019-02-09 | Disposition: A | Discharge status: Home / Self Care | Payer: Medicare HMO | Source: Ambulatory Visit | Attending: Internal Medicine | Admitting: Internal Medicine

## 2019-02-09 ENCOUNTER — Other Ambulatory Visit: Payer: Self-pay | Admitting: Internal Medicine

## 2019-02-09 ENCOUNTER — Other Ambulatory Visit: Payer: Self-pay

## 2019-02-09 DIAGNOSIS — Z170 Estrogen receptor positive status [ER+]: Secondary | ICD-10-CM | POA: Insufficient documentation

## 2019-02-09 DIAGNOSIS — C787 Secondary malignant neoplasm of liver and intrahepatic bile duct: Secondary | ICD-10-CM | POA: Diagnosis not present

## 2019-02-09 DIAGNOSIS — K7689 Other specified diseases of liver: Secondary | ICD-10-CM | POA: Diagnosis not present

## 2019-02-09 DIAGNOSIS — C50412 Malignant neoplasm of upper-outer quadrant of left female breast: Secondary | ICD-10-CM | POA: Diagnosis not present

## 2019-02-09 DIAGNOSIS — C50919 Malignant neoplasm of unspecified site of unspecified female breast: Secondary | ICD-10-CM | POA: Diagnosis not present

## 2019-02-09 MED ORDER — GADOBUTROL 1 MMOL/ML IV SOLN
6.0000 mL | Freq: Once | INTRAVENOUS | Status: AC | PRN
  Administered 2019-02-09: 6 mL via INTRAVENOUS

## 2019-02-13 ENCOUNTER — Telehealth: Payer: Self-pay | Admitting: Internal Medicine

## 2019-02-13 DIAGNOSIS — Z170 Estrogen receptor positive status [ER+]: Secondary | ICD-10-CM

## 2019-02-13 DIAGNOSIS — C787 Secondary malignant neoplasm of liver and intrahepatic bile duct: Secondary | ICD-10-CM

## 2019-02-13 DIAGNOSIS — C50412 Malignant neoplasm of upper-outer quadrant of left female breast: Secondary | ICD-10-CM

## 2019-02-13 NOTE — Telephone Encounter (Signed)
Spoke to pt re: results of MRI-   Will discuss at the tumor conference regarding liver ablation.  Please make a referral to IR regarding liver ablation.

## 2019-02-14 ENCOUNTER — Other Ambulatory Visit: Payer: Self-pay | Admitting: *Deleted

## 2019-02-14 DIAGNOSIS — C50412 Malignant neoplasm of upper-outer quadrant of left female breast: Secondary | ICD-10-CM

## 2019-02-14 NOTE — Telephone Encounter (Signed)
Order for Amb. Ref. to IR entered.  Colette, please contact IR dept in [place] to arrange for this apt. It would be an in office apt first

## 2019-02-16 ENCOUNTER — Other Ambulatory Visit: Payer: Medicare HMO

## 2019-02-16 NOTE — Progress Notes (Signed)
Tumor Board Documentation  Sarah Horne was presented by Dr Rogue Bussing at our Tumor Board on 02/16/2019, which included representatives from medical oncology, radiation oncology, surgical oncology, surgical, radiology, pathology, navigation, internal medicine, research, palliative care, pulmonology.  Sarah Horne currently presents as a current patient, for discussion, for progression with history of the following treatments: active survellience, surgical intervention(s), adjuvant chemotherapy.  Additionally, we reviewed previous medical and familial history, history of present illness, and recent lab results along with all available histopathologic and imaging studies. The tumor board considered available treatment options and made the following recommendations: Additional screening PET scan, Possible Y 90 therapy to liver  The following procedures/referrals were also placed: No orders of the defined types were placed in this encounter.   Clinical Trial Status: not discussed   Staging used: AJCC Stage Group  AJCC Staging: T: cT2 N: N1   Group: Stage 2 Breast cancer   National site-specific guidelines NCCN were discussed with respect to the case.  Tumor board is a meeting of clinicians from various specialty areas who evaluate and discuss patients for whom a multidisciplinary approach is being considered. Final determinations in the plan of care are those of the provider(s). The responsibility for follow up of recommendations given during tumor board is that of the provider.   Today's extended care, comprehensive team conference, Sarah Horne was not present for the discussion and was not examined.   Multidisciplinary Tumor Board is a multidisciplinary case peer review process.  Decisions discussed in the Multidisciplinary Tumor Board reflect the opinions of the specialists present at the conference without having examined the patient.  Ultimately, treatment and diagnostic decisions rest with the  primary provider(s) and the patient.

## 2019-02-17 ENCOUNTER — Other Ambulatory Visit: Payer: Self-pay | Admitting: Internal Medicine

## 2019-02-17 DIAGNOSIS — C50412 Malignant neoplasm of upper-outer quadrant of left female breast: Secondary | ICD-10-CM

## 2019-02-17 DIAGNOSIS — C787 Secondary malignant neoplasm of liver and intrahepatic bile duct: Secondary | ICD-10-CM

## 2019-02-17 DIAGNOSIS — Z170 Estrogen receptor positive status [ER+]: Secondary | ICD-10-CM

## 2019-02-17 NOTE — Progress Notes (Signed)
Heather-please inform patient that we discussed the case of a conference.  Recommended the PET scan ASAP  PET scan will help [place] decide-regarding local liver ablation [as previously discussed with patient].  Recommend follow-up as planned.

## 2019-02-17 NOTE — Progress Notes (Signed)
Patient contacted regarding the plan of care.  She was given her apts for the pet scan for June 3rd along with pre-procedure prep (NPO after midnight and low carb meal the evening prior).  Pt wanted to know if this apt for the pet scan could be sooner. I will check with Dr. B to see if md needs the pet scan prior to the 6/2 apt.

## 2019-02-28 ENCOUNTER — Ambulatory Visit: Payer: Medicare HMO

## 2019-02-28 ENCOUNTER — Ambulatory Visit: Payer: Medicare HMO | Admitting: Internal Medicine

## 2019-02-28 ENCOUNTER — Other Ambulatory Visit: Payer: Medicare HMO

## 2019-03-01 ENCOUNTER — Other Ambulatory Visit: Payer: Self-pay

## 2019-03-01 ENCOUNTER — Encounter
Admission: RE | Admit: 2019-03-01 | Discharge: 2019-03-01 | Disposition: A | Discharge status: Home / Self Care | Payer: Medicare HMO | Source: Ambulatory Visit | Attending: Internal Medicine | Admitting: Internal Medicine

## 2019-03-01 DIAGNOSIS — C50412 Malignant neoplasm of upper-outer quadrant of left female breast: Secondary | ICD-10-CM | POA: Diagnosis not present

## 2019-03-01 DIAGNOSIS — C787 Secondary malignant neoplasm of liver and intrahepatic bile duct: Secondary | ICD-10-CM | POA: Diagnosis not present

## 2019-03-01 DIAGNOSIS — I2510 Atherosclerotic heart disease of native coronary artery without angina pectoris: Secondary | ICD-10-CM | POA: Diagnosis not present

## 2019-03-01 DIAGNOSIS — J439 Emphysema, unspecified: Secondary | ICD-10-CM | POA: Diagnosis not present

## 2019-03-01 DIAGNOSIS — Z170 Estrogen receptor positive status [ER+]: Secondary | ICD-10-CM | POA: Diagnosis not present

## 2019-03-01 DIAGNOSIS — C50919 Malignant neoplasm of unspecified site of unspecified female breast: Secondary | ICD-10-CM | POA: Diagnosis not present

## 2019-03-01 DIAGNOSIS — I700 Atherosclerosis of aorta: Secondary | ICD-10-CM | POA: Diagnosis not present

## 2019-03-01 LAB — GLUCOSE, CAPILLARY: Glucose-Capillary: 98 mg/dL (ref 70–99)

## 2019-03-01 MED ORDER — FLUDEOXYGLUCOSE F - 18 (FDG) INJECTION
7.8000 | Freq: Once | INTRAVENOUS | Status: AC | PRN
  Administered 2019-03-01: 7.84 via INTRAVENOUS

## 2019-03-02 ENCOUNTER — Inpatient Hospital Stay: Payer: Medicare HMO

## 2019-03-02 ENCOUNTER — Inpatient Hospital Stay (HOSPITAL_BASED_OUTPATIENT_CLINIC_OR_DEPARTMENT_OTHER): Payer: Medicare HMO | Admitting: Internal Medicine

## 2019-03-02 ENCOUNTER — Other Ambulatory Visit: Payer: Self-pay

## 2019-03-02 ENCOUNTER — Inpatient Hospital Stay: Payer: Medicare HMO | Attending: Internal Medicine

## 2019-03-02 ENCOUNTER — Encounter: Payer: Self-pay | Admitting: Internal Medicine

## 2019-03-02 VITALS — BP 134/90 | HR 109 | Temp 99.2°F | Wt 150.0 lb

## 2019-03-02 DIAGNOSIS — C787 Secondary malignant neoplasm of liver and intrahepatic bile duct: Secondary | ICD-10-CM

## 2019-03-02 DIAGNOSIS — Z803 Family history of malignant neoplasm of breast: Secondary | ICD-10-CM | POA: Insufficient documentation

## 2019-03-02 DIAGNOSIS — I252 Old myocardial infarction: Secondary | ICD-10-CM

## 2019-03-02 DIAGNOSIS — C7931 Secondary malignant neoplasm of brain: Secondary | ICD-10-CM

## 2019-03-02 DIAGNOSIS — M8580 Other specified disorders of bone density and structure, unspecified site: Secondary | ICD-10-CM

## 2019-03-02 DIAGNOSIS — C50412 Malignant neoplasm of upper-outer quadrant of left female breast: Secondary | ICD-10-CM

## 2019-03-02 DIAGNOSIS — Z86711 Personal history of pulmonary embolism: Secondary | ICD-10-CM | POA: Insufficient documentation

## 2019-03-02 DIAGNOSIS — Z170 Estrogen receptor positive status [ER+]: Secondary | ICD-10-CM

## 2019-03-02 DIAGNOSIS — I471 Supraventricular tachycardia: Secondary | ICD-10-CM | POA: Insufficient documentation

## 2019-03-02 DIAGNOSIS — I10 Essential (primary) hypertension: Secondary | ICD-10-CM | POA: Diagnosis not present

## 2019-03-02 DIAGNOSIS — Z7902 Long term (current) use of antithrombotics/antiplatelets: Secondary | ICD-10-CM | POA: Diagnosis not present

## 2019-03-02 DIAGNOSIS — F17210 Nicotine dependence, cigarettes, uncomplicated: Secondary | ICD-10-CM

## 2019-03-02 DIAGNOSIS — Z79899 Other long term (current) drug therapy: Secondary | ICD-10-CM | POA: Diagnosis not present

## 2019-03-02 DIAGNOSIS — Z9221 Personal history of antineoplastic chemotherapy: Secondary | ICD-10-CM | POA: Insufficient documentation

## 2019-03-02 DIAGNOSIS — C7949 Secondary malignant neoplasm of other parts of nervous system: Secondary | ICD-10-CM | POA: Diagnosis not present

## 2019-03-02 DIAGNOSIS — Z95828 Presence of other vascular implants and grafts: Secondary | ICD-10-CM

## 2019-03-02 LAB — COMPREHENSIVE METABOLIC PANEL
ALT: 22 U/L (ref 0–44)
AST: 29 U/L (ref 15–41)
Albumin: 4.1 g/dL (ref 3.5–5.0)
Alkaline Phosphatase: 196 U/L — ABNORMAL HIGH (ref 38–126)
Anion gap: 8 (ref 5–15)
BUN: 17 mg/dL (ref 8–23)
CO2: 28 mmol/L (ref 22–32)
Calcium: 9.1 mg/dL (ref 8.9–10.3)
Chloride: 103 mmol/L (ref 98–111)
Creatinine, Ser: 0.95 mg/dL (ref 0.44–1.00)
GFR calc Af Amer: >60 mL/min (ref >60)
GFR calc non Af Amer: >60 mL/min (ref >60)
Glucose, Bld: 74 mg/dL (ref 70–99)
Potassium: 4.2 mmol/L (ref 3.5–5.1)
Sodium: 139 mmol/L (ref 135–145)
Total Bilirubin: 0.9 mg/dL (ref 0.3–1.2)
Total Protein: 7.5 g/dL (ref 6.5–8.1)

## 2019-03-02 LAB — CBC WITH DIFFERENTIAL/PLATELET
Abs Immature Granulocytes: 0 10*3/uL (ref 0.00–0.07)
Basophils Absolute: 0 10*3/uL (ref 0.0–0.1)
Basophils Relative: 1 %
Eosinophils Absolute: 0.1 10*3/uL (ref 0.0–0.5)
Eosinophils Relative: 5 %
HCT: 43.1 % (ref 36.0–46.0)
Hemoglobin: 14.2 g/dL (ref 12.0–15.0)
Immature Granulocytes: 0 %
Lymphocytes Relative: 38 %
Lymphs Abs: 0.9 10*3/uL (ref 0.7–4.0)
MCH: 30.1 pg (ref 26.0–34.0)
MCHC: 32.9 g/dL (ref 30.0–36.0)
MCV: 91.5 fL (ref 80.0–100.0)
Monocytes Absolute: 0.2 10*3/uL (ref 0.1–1.0)
Monocytes Relative: 9 %
Neutro Abs: 1.1 10*3/uL — ABNORMAL LOW (ref 1.7–7.7)
Neutrophils Relative %: 47 %
Platelets: 142 10*3/uL — ABNORMAL LOW (ref 150–400)
RBC: 4.71 MIL/uL (ref 3.87–5.11)
RDW: 15.2 % (ref 11.5–15.5)
WBC: 2.2 10*3/uL — ABNORMAL LOW (ref 4.0–10.5)
nRBC: 0 % (ref 0.0–0.2)

## 2019-03-02 MED ORDER — HEPARIN SOD (PORK) LOCK FLUSH 100 UNIT/ML IV SOLN
500.0000 [IU] | Freq: Once | INTRAVENOUS | Status: AC
  Administered 2019-03-02: 500 [IU]

## 2019-03-02 MED ORDER — SODIUM CHLORIDE 0.9% FLUSH
10.0000 mL | Freq: Once | INTRAVENOUS | Status: AC
  Administered 2019-03-02: 09:00:00 10 mL via INTRAVENOUS
  Filled 2019-03-02: qty 10

## 2019-03-02 NOTE — Assessment & Plan Note (Addendum)
#  Recurrent metastatic breast cancer-ER PR positive HER-2 negative; Currently on Eribulin [started on 2/25].  June 3rd PET scan shows-up to 3.5 cm left lobe of liver uptake; no evidence of any disease uptake anywhere else.   #Given the significant difficulty with bone marrow suppression from chemotherapy-recommend holding at this time.  Will evaluate for interventional radiology approaches-like liver ablation.  This was discussed at the tumor conference.  I have reached out to Dr. Pascal Lux from radiology.  #Severe bone marrow suppression from chemotherapy-since holding the chemotherapy improving today ANC is 1.2.  #Elevated alkaline phosphatase-likely second malignancy.  Stable.  # Brain metastases-recurrent/likely leptomeningeal disease;  S/p WBRT [April 2019]. NOv 19th MRI stable-no progressive leptomeningeal disease.  Stable  #Peripheral neuropathy grade 1-  on Lyrica; stable  # DISPOSITION: # HOLD chemo today; de-access #4 weeks-MD; labs-CBC CMP; no chemo- Dr.B

## 2019-03-02 NOTE — Progress Notes (Signed)
Patient here today for follow up and chemotherapy.  Patient states no new concerns today

## 2019-03-02 NOTE — Progress Notes (Signed)
[facility] OFFICE PROGRESS NOTE  Patient Care Team: Sarah, Satira Anis, MD as PCP - General (Family Medicine) Sarah Hampshire, MD as PCP - Cardiology (Cardiology) Byrnett, Forest Gleason, MD (General Surgery) Sarah Alf, MD (Inactive) as Attending Physician (Internal Medicine)  Cancer Staging No matching staging information was found for the patient.   Oncology History   # LEFT BREAST IDC; STAGE II [cT2N1] ER >90%; PR- 50-90%; her 2 NEU- POS; s/p Neoadj chemo; AUG 2016- TCH+P s/p Lumpec & partial ALND- path CR; s/p RT [finished Nov 2016];  adj Herceptin; HELD for Jan 19th 2017 [in DEC 2016-EF dropped from 63 to 51%; FEB 27th EF-42.9%]; JAN 2016 START Arimidex; May 2017- EF- 60%; May 24th 2017-Re-start Herceptin q 3W; July 28th STOP herceptin [finished 81m/m2; sec to Low EF; however 2017 Oct EF= 67%; improved]  # DEC 4th 2017- Start Neratinib 4 pills; DEC 11th 3 pills; STOPPED.  # FEB 2018- RECURRENCE ER/PR positive; Her 2 NEGATIVE [liver Bx]  # MARCH 1st 2018- Tax-Cytoxan [poor tol]  # FEB 2018- Brain mets [SBRT; Dr.Crystal; finished Feb 28th 2018]  # March 2018- Eribulin- poor tolerance  # June 8th 2018- Started Faslodex + Abema [abema-multiple interruptions sec to neutropenia]  # MRI Brain- March 2019- Leptomeningeal mets [WBRT; No LP s/p WBRT- 01/09/2018]; STOP Abema [562mday-sec to severe cytopenia]+faslodex  # MAY 20tth 019- Start Xeloda; STOPPED in Nov 22nd 2019- sec to Progressive liver lesions on CT scan  # NOV 26th 2019- Faslodex + Piqaray 25050m; Jan 2020- Piqray 300 mg+ faslodex; FEB 17th CT scan- mixed response/ Overall progression; STOP Piqray + faslodex.   # FEB 25th 2020- ERIBULIN; Poor tolerance sec to severe neutropenia.  Stopped May 2020.  CT scan May 2020-new/progressive 2 cm liver lesion;   #May 2020 Ixempra-awaiting improvement of counts  # PN- G-2; may 2017- Cymbalta 6m20mMARCH 2018-  acute vascular insuff of BIL LE [? Taxotere  vasoconstriction on asprin]; # hemorragic shock LGIB [s/p colo; Dr.Wohl]  #  SVT- on flecainide.   # April 2016- Liver Bx- NEG;   # Drop in EF from Herceptin [recovered OCt 27th- EF 65%]  # BMD- jan 2017- osteopenia  -----------------------------------------------------------     [place]- Sep 4th 2018- PDL-1 0%/NEG for BRCA; MSI-Stable; Positive for PI3K; ccnd-1; FGF** -----------------------------------------------------------  Dx: Metastatic Breast cancer [ER/PR-Pos; her -2 NEG] Stage IV; goals: palliative Current treatment-holding chemotherapy     Carcinoma of upper-outer quadrant of left breast in female, estrogen receptor positive (HCC)[name]2/25/2020 - 01/17/2019 Chemotherapy    The patient had eriBULin mesylate (HALAVEN) 2.1 mg in sodium chloride 0.9 % 100 mL chemo infusion, 1.2 mg/m2 = 2.1 mg (100 % of original dose 1.2 mg/m2), Intravenous,  Once, 3 of 4 cycles Dose modification: 1.2 mg/m2 (original dose 1.2 mg/m2, Cycle 1, Reason: Provider Judgment), 0.8 mg/m2 (original dose 1.2 mg/m2, Cycle 3, Reason: Provider Judgment) Administration: 2.1 mg (11/22/2018), 2.1 mg (11/29/2018), 2.1 mg (12/20/2018), 1.4 mg (01/17/2019)  for chemotherapy treatment.     03/02/2019 -  Chemotherapy    The patient had pegfilgrastim (NEULASTA ONPRO KIT) injection 6 mg, 6 mg, Subcutaneous, Once, 0 of 4 cycles ixabepilone (IXEMPRA) 72 mg in lactated ringers 250 mL chemo infusion, 40 mg/m2, Intravenous,  Once, 0 of 4 cycles  for chemotherapy treatment.      INTERVAL HISTORY:  Sarah Horne.  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  Patient is here to review results of a PET scan for further evaluation of the liver lesions.  Patient appetite is good.  Denies any nausea vomiting or weight loss.  Chronic mild tingling  and numbness in extremities.  Not any worse.  No headaches.  Review of Systems  Constitutional: Positive for malaise/fatigue. Negative for chills, diaphoresis, fever and weight loss.  HENT: Negative for nosebleeds and sore throat.   Eyes: Negative for double vision.  Respiratory: Negative for cough, hemoptysis, sputum production, shortness of breath and wheezing.   Cardiovascular: Negative for chest pain, palpitations, orthopnea and leg swelling.  Gastrointestinal: Negative for abdominal pain, blood in stool, constipation, heartburn, melena, nausea and vomiting.  Genitourinary: Negative for dysuria, frequency and urgency.  Musculoskeletal: Positive for joint pain. Negative for back pain.  Skin: Negative for itching.  Neurological: Positive for tingling. Negative for dizziness, focal weakness, weakness and headaches.  Endo/Heme/Allergies: Does not bruise/bleed easily.  Psychiatric/Behavioral: Negative for depression. The patient is not nervous/anxious and does not have insomnia.       PAST MEDICAL HISTORY :  Past Medical History:  Diagnosis Date  . Chemotherapy-induced peripheral neuropathy ([place])   . Epistaxis    a. 11/2016 in setting of asa/plavix-->silver nitrate cauterization.  . GI bleed    a. 11/2016 Admission w/ GIB and hypovolemic shock req 3u PRBC's;  b. 11/2016 ECG: gastritis & nonbleeding peptic ulcer; c. 11/2016 Conlonoscopy: rectal and sigmoid colonic ulcers.  . Heart attack ([place])    a. 1998 Cath @ [facility]: reportedly no intervention required.  Marland Kitchen Herceptin-induced cardiomyopathy ([place])    a. In the setting of Herceptin Rx for breast cancer (initiated 12/2014); b. 03/2015 MUGA EF 64%; b. 08/2015 MUGA: EF 51%; c. 10/2015 MUGA: EF 44%; d. 11/2015 Echo: EF 45-50%; e. 01/2016 MUGA: EF 60%; f. 06/2016 MUGA EF 65%; g. 10/2016 MUGA: EF 61%;  h. 12/2016 Echo: EF 55-60%, gr1 DD.  Marland Kitchen Hyperlipidemia   . Hypertension   . Neuropathy   . Possible PAD (peripheral artery disease) ([place])    a. 11/2016 LE  cyanosis and weak pulses-->CTA w/o significant Ao-BiFem dzs. ? distal dzs-->ASA/Plavix initiated by vascular surgery.  Marland Kitchen PSVT (paroxysmal supraventricular tachycardia) ([place])    a. Dx 11/2016.  . Pulmonary embolism ([place])    a. 12/2016 CTA Chest: small nonocclusive PE in inferior segment of the Left lingula, somewhat eccentric filling defect suggesting chronic rather than acute embolic event; b. 09/1019 LE U/S:  No DVT; c. 12/2016 Echo: Nl RV fxn, nl PASP.  Marland Kitchen Recurrent Metastatic breast cancer ([place])    a. Dx 2016: Stage II, ER positive, PR positive, HER-2/neu overexpressing of the left breast-->chemo/radiation; b. 10/2016 CT Abd/pelvis: diffuse liver mets, ill defined sclerotic bone lesions-T12;  c. 10/2016 MRI brain: metastatic lesion along L temporal lobe (19x50m) w/ extensive surrounding edema & 528mmidline shift to right.  . Sinus tachycardia     PAST SURGICAL HISTORY :   Past Surgical History:  Procedure Laterality Date  . BREAST BIOPSY Left 2016   Positive  . BREAST LUMPECTOMY WITH SENTINEL LYMPH NODE BIOPSY Left 05/23/2015   Procedure: LEFT BREAST WIDE EXCISION WITH AXILLARY DISSECTION, MASTOPLASTY ;  Surgeon: JeRobert BellowMD;  Location: [facility] ORS;  Service: General;  Laterality: Left;  . BREAST SURGERY Left 12/18/14   breast biopsy/INVASIVE DUCTAL CARCINOMA OF BREAST, NOTTINGHAM GRADE 2.  . Marland KitchenREAST SURGERY  05/23/2015.   Wide excision/mastoplasty,  axillary dissection. No residual invasive cancer, positive for residual DCIS. 0/2 nodes identified on axillary dissection. (no SLN by technetium or methylene blue)  . CARDIAC CATHETERIZATION    . COLONOSCOPY WITH PROPOFOL N/A 12/10/2016   Procedure: COLONOSCOPY WITH PROPOFOL;  Surgeon: Lucilla Lame, MD;  Location: [facility] ENDOSCOPY;  Service: Endoscopy;  Laterality: N/A;  . COLONOSCOPY WITH PROPOFOL N/A 02/13/2017   Procedure: COLONOSCOPY WITH PROPOFOL;  Surgeon: Lucilla Lame, MD;  Location: [facility] ENDOSCOPY;  Service: Endoscopy;  Laterality: N/A;  .  ESOPHAGOGASTRODUODENOSCOPY (EGD) WITH PROPOFOL N/A 12/08/2016   Procedure: ESOPHAGOGASTRODUODENOSCOPY (EGD) WITH PROPOFOL;  Surgeon: Lucilla Lame, MD;  Location: [facility] ENDOSCOPY;  Service: Endoscopy;  Laterality: N/A;  . IVC FILTER INSERTION N/A 02/15/2017   Procedure: IVC Filter Insertion;  Surgeon: Algernon Huxley, MD;  Location: [facility] CV LAB;  Service: Cardiovascular;  Laterality: N/A;  . PORTACATH PLACEMENT Right 12-31-14   Dr Bary Castilla    FAMILY HISTORY :   Family History  Problem Relation Age of Onset  . Breast cancer Maternal Aunt   . Breast cancer Cousin   . Brain cancer Maternal Uncle   . Diabetes Mother   . Hypertension Mother   . Stroke Mother     SOCIAL HISTORY:   Social History   Tobacco Use  . Smoking status: Light Tobacco Smoker    Packs/day: 0.50    Years: 18.00    Pack years: 9.00    Types: Cigarettes  . Smokeless tobacco: Never Used  Substance Use Topics  . Alcohol use: No    Alcohol/week: 0.0 standard drinks  . Drug use: No    ALLERGIES:  is allergic to no known allergies.  MEDICATIONS:  Current Outpatient Medications  Medication Sig Dispense Refill  . flecainide (TAMBOCOR) 50 MG tablet Take 1 tablet (50 mg total) by mouth 2 (two) times daily. 180 tablet 3  . magnesium oxide (MAG-OX) 400 MG tablet TAKE 1 TABLET BY MOUTH TWICE A DAY 60 tablet 6  . pregabalin (LYRICA) 75 MG capsule Take 1 capsule (75 mg total) by mouth 2 (two) times daily. 180 capsule 0  . triamcinolone ointment (KENALOG) 0.5 % Apply 1 application topically 2 (two) times daily. 30 g 2  . Vitamin D, Ergocalciferol, (DRISDOL) 1.25 MG (50000 UT) CAPS capsule TAKE 1 CAPSULE (50,000 UNITS TOTAL) ONCE A WEEK BY MOUTH. 12 capsule 1   No current facility-administered medications for this visit.    Facility-Administered Medications Ordered in Other Visits  Medication Dose Route Frequency Provider Last Rate Last Dose  . 0.9 %  sodium chloride infusion   Intravenous Continuous Pandit, Sandeep, MD       . 0.9 %  sodium chloride infusion   Intravenous Continuous Lloyd Huger, MD 999 mL/hr at 04/24/15 1520    . heparin lock flush 100 unit/mL  500 Units Intravenous Once Berenzon, Dmitriy, MD      . sodium chloride 0.9 % injection 10 mL  10 mL Intravenous PRN Sarah Alf, MD   10 mL at 04/04/15 1440  . sodium chloride flush (NS) 0.9 % injection 10 mL  10 mL Intravenous PRN Berenzon, Dmitriy, MD        PHYSICAL EXAMINATION: ECOG PERFORMANCE STATUS: 1 - Symptomatic but completely ambulatory  BP 134/90 (BP Location: Right Arm, Patient Position: Sitting)   Pulse (!) 109   Temp 99.2 F (37.3 C) (Tympanic)   Wt 150 lb (68 kg)   BMI 24.21 kg/m   [employer]   03/02/19 0857  Weight: 150 lb (68 kg)    Physical Exam  Constitutional: She is oriented to person, place, and time and well-developed, well-nourished, and in no distress.  Alone.  Walking herself.  HENT:  Head: Normocephalic and atraumatic.  Mouth/Throat: Oropharynx is clear and moist. No oropharyngeal exudate.  Eyes: Pupils are equal, round, and reactive to light.  Neck: Normal range of motion. Neck supple.  Cardiovascular: Normal rate and regular rhythm.  Pulmonary/Chest: No respiratory distress. She has no wheezes.  Abdominal: Soft. Bowel sounds are normal. She exhibits no distension and no mass. There is no abdominal tenderness. There is no rebound and no guarding.  Musculoskeletal: Normal range of motion.        General: No tenderness or edema.  Neurological: She is alert and oriented to person, place, and time.  Skin: Skin is warm.  hyperpigmentation of the bilateral hand and feet (better)  Psychiatric: Affect normal.       LABORATORY DATA:  I have reviewed the data as listed    Component Value Date/Time   NA 139 03/02/2019 0834   NA 135 03/04/2017 0918   NA 135 01/08/2015 0850   K 4.2 03/02/2019 0834   K 3.7 01/08/2015 0850   CL 103 03/02/2019 0834   CL 101 01/08/2015 0850   CO2 28 03/02/2019  0834   CO2 26 01/08/2015 0850   GLUCOSE 74 03/02/2019 0834   GLUCOSE 161 (H) 01/08/2015 0850   BUN 17 03/02/2019 0834   BUN 8 03/04/2017 0918   BUN 17 01/08/2015 0850   CREATININE 0.95 03/02/2019 0834   CREATININE 0.82 01/22/2015 1559   CALCIUM 9.1 03/02/2019 0834   CALCIUM 9.3 01/08/2015 0850   PROT 7.5 03/02/2019 0834   PROT 6.4 03/04/2017 0918   PROT 6.8 01/22/2015 1559   ALBUMIN 4.1 03/02/2019 0834   ALBUMIN 2.5 (L) 03/04/2017 0918   ALBUMIN 3.9 01/22/2015 1559   AST 29 03/02/2019 0834   AST 34 01/22/2015 1559   ALT 22 03/02/2019 0834   ALT 42 01/22/2015 1559   ALKPHOS 196 (H) 03/02/2019 0834   ALKPHOS 157 (H) 01/22/2015 1559   BILITOT 0.9 03/02/2019 0834   BILITOT 6.2 (H) 03/04/2017 0918   BILITOT 0.2 (L) 01/22/2015 1559   GFRNONAA >60 03/02/2019 0834   GFRNONAA >60 01/22/2015 1559   GFRAA >60 03/02/2019 0834   GFRAA >60 01/22/2015 1559    No results found for: SPEP, UPEP  Lab Results  Component Value Date   WBC 2.2 (L) 03/02/2019   NEUTROABS 1.1 (L) 03/02/2019   HGB 14.2 03/02/2019   HCT 43.1 03/02/2019   MCV 91.5 03/02/2019   PLT 142 (L) 03/02/2019      Chemistry      Component Value Date/Time   NA 139 03/02/2019 0834   NA 135 03/04/2017 0918   NA 135 01/08/2015 0850   K 4.2 03/02/2019 0834   K 3.7 01/08/2015 0850   CL 103 03/02/2019 0834   CL 101 01/08/2015 0850   CO2 28 03/02/2019 0834   CO2 26 01/08/2015 0850   BUN 17 03/02/2019 0834   BUN 8 03/04/2017 0918   BUN 17 01/08/2015 0850   CREATININE 0.95 03/02/2019 0834   CREATININE 0.82 01/22/2015 1559      Component Value Date/Time   CALCIUM 9.1 03/02/2019 0834   CALCIUM 9.3 01/08/2015 0850   ALKPHOS 196 (H) 03/02/2019 0834   ALKPHOS 157 (H) 01/22/2015 1559   AST 29 03/02/2019 0834   AST 34 01/22/2015  1559   ALT 22 03/02/2019 0834   ALT 42 01/22/2015 1559   BILITOT 0.9 03/02/2019 0834   BILITOT 6.2 (H) 03/04/2017 0918   BILITOT 0.2 (L) 01/22/2015 1559       RADIOGRAPHIC STUDIES: I  have personally reviewed the radiological images as listed and agreed with the findings in the report. Nm Pet Image Restag (ps) Skull Base To Thigh  Result Date: 03/01/2019 CLINICAL DATA:  Subsequent treatment strategy for breast cancer with liver metastasis. EXAM: NUCLEAR MEDICINE PET SKULL BASE TO THIGH TECHNIQUE: 7.84 mCi F-18 FDG was injected intravenously. Full-ring PET imaging was performed from the skull base to thigh after the radiotracer. CT data was obtained and used for attenuation correction and anatomic localization. Fasting blood glucose: 98 mg/dl COMPARISON:  MRI 02/09/2019 and CT chest from 02/03/2019 FINDINGS: Mediastinal blood pool activity: SUV max 2.25 Liver activity: SUV max NA NECK: No hypermetabolic lymph nodes in the neck. Incidental CT findings: none CHEST: No hypermetabolic axillary, supraclavicular, mediastinal, or hilar lymph nodes identified. No hypermetabolic pulmonary nodule or mass identified. Incidental CT findings: Mild changes of emphysema. Aortic atherosclerosis. Calcification within the LAD, left circumflex and RCA coronary arteries noted. ABDOMEN/PELVIS: Pseudo cirrhotic appearance of the liver identified. Recently described peripherally enhancing mass within dome of left lobe of liver measures 3.5 cm. This has an SUV max of 4.3. This is compared with background liver activity of 2.86. No additional focal liver abnormalities. No abnormal tracer activity within the pancreas, spleen or adrenal glands. Incidental CT findings: IVC filter. Aortic atherosclerosis. Partially calcified fibroid uterus. SKELETON: No abnormal FDG uptake identified within the visualized osseous structures. Post treatment changes within the central and lateral left breast identified with asymmetric increased uptake with SUV max of 3.19. Incidental CT findings: none IMPRESSION: 1. Increased FDG uptake is identified within left lobe of liver corresponding to the recently characterized peripherally enhancing  lesion. Findings are worrisome for metabolically active liver metastasis. 2. No additional sites suspicious for metastatic disease. 3. Nonspecific increased uptake within the left breast corresponding to areas of post treatment changes. 4. Aortic Atherosclerosis (ICD10-I70.0) and Emphysema (ICD10-J43.9). 5. Coronary artery calcifications. Electronically Signed   By: Kerby Moors M.D.   On: 03/01/2019 15:56     ASSESSMENT & PLAN:  Carcinoma of upper-outer quadrant of left breast in female, estrogen receptor positive ([place]) # Recurrent metastatic breast cancer-ER PR positive HER-2 negative; Currently on Eribulin [started on 2/25].   #Hold Ixempra given the continued severe neutropenia ANC 0.6.  Given the continued severe bone marrow suppression-and the lack of any significant progressive disease [except for 2 cm lesion in the left lobe of the liver]-I think it is reasonable to hold systemic therapy at this time  # " New/progressive" 2 cm left lower lobe lesion-recommend MRI for further evaluation.  Consider ablation.  Discussed with the patient she is in agreement.  #Severe neutropenia-ANC 600; eribulin.  Patient is asymptomatic today.  Hold off growth factor support.   #Elevated alkaline phosphatase- 2 ? Cancer vs medications.    Stable.   # Brain metastases-recurrent/likely leptomeningeal disease;  S/p WBRT [April 2019]. NOv 19th MRI stable-no progressive leptomeningeal disease.  Clinically stable  #Peripheral neuropathy grade 1-  on Lyrica; stable  # DISPOSITION: # HOLD chemo today; de-access # MRI liver ASAP.  # follow up in 3 weeks-MD-labs- cbc/cmpIxempra [new]; onpro-- Dr.B     No orders of the defined types were placed in this encounter.  All questions were answered. The patient knows to  call the clinic with any problems, questions or concerns.      Cammie Sickle, MD 03/02/2019 10:35 AM

## 2019-03-03 ENCOUNTER — Other Ambulatory Visit: Payer: Self-pay | Admitting: *Deleted

## 2019-03-03 DIAGNOSIS — Z170 Estrogen receptor positive status [ER+]: Secondary | ICD-10-CM

## 2019-03-03 DIAGNOSIS — C50412 Malignant neoplasm of upper-outer quadrant of left female breast: Secondary | ICD-10-CM

## 2019-03-11 ENCOUNTER — Other Ambulatory Visit: Payer: Self-pay | Admitting: Internal Medicine

## 2019-03-11 NOTE — Progress Notes (Signed)
Please also schedule the patient for on pro on July 2nd.

## 2019-03-30 ENCOUNTER — Other Ambulatory Visit: Payer: Self-pay

## 2019-03-30 ENCOUNTER — Inpatient Hospital Stay: Payer: Medicare HMO

## 2019-03-30 ENCOUNTER — Inpatient Hospital Stay (HOSPITAL_BASED_OUTPATIENT_CLINIC_OR_DEPARTMENT_OTHER): Payer: Medicare HMO | Admitting: Internal Medicine

## 2019-03-30 ENCOUNTER — Inpatient Hospital Stay: Payer: Medicare HMO | Attending: Internal Medicine

## 2019-03-30 DIAGNOSIS — I252 Old myocardial infarction: Secondary | ICD-10-CM

## 2019-03-30 DIAGNOSIS — E785 Hyperlipidemia, unspecified: Secondary | ICD-10-CM

## 2019-03-30 DIAGNOSIS — C50412 Malignant neoplasm of upper-outer quadrant of left female breast: Secondary | ICD-10-CM | POA: Diagnosis not present

## 2019-03-30 DIAGNOSIS — C7931 Secondary malignant neoplasm of brain: Secondary | ICD-10-CM | POA: Insufficient documentation

## 2019-03-30 DIAGNOSIS — C787 Secondary malignant neoplasm of liver and intrahepatic bile duct: Secondary | ICD-10-CM | POA: Diagnosis not present

## 2019-03-30 DIAGNOSIS — Z79899 Other long term (current) drug therapy: Secondary | ICD-10-CM

## 2019-03-30 DIAGNOSIS — Z5111 Encounter for antineoplastic chemotherapy: Secondary | ICD-10-CM | POA: Insufficient documentation

## 2019-03-30 DIAGNOSIS — Z170 Estrogen receptor positive status [ER+]: Secondary | ICD-10-CM

## 2019-03-30 DIAGNOSIS — Z86711 Personal history of pulmonary embolism: Secondary | ICD-10-CM | POA: Diagnosis not present

## 2019-03-30 DIAGNOSIS — I10 Essential (primary) hypertension: Secondary | ICD-10-CM

## 2019-03-30 DIAGNOSIS — Z803 Family history of malignant neoplasm of breast: Secondary | ICD-10-CM | POA: Insufficient documentation

## 2019-03-30 DIAGNOSIS — F17210 Nicotine dependence, cigarettes, uncomplicated: Secondary | ICD-10-CM

## 2019-03-30 DIAGNOSIS — Z5189 Encounter for other specified aftercare: Secondary | ICD-10-CM | POA: Diagnosis not present

## 2019-03-30 DIAGNOSIS — Z7902 Long term (current) use of antithrombotics/antiplatelets: Secondary | ICD-10-CM | POA: Insufficient documentation

## 2019-03-30 DIAGNOSIS — G629 Polyneuropathy, unspecified: Secondary | ICD-10-CM

## 2019-03-30 DIAGNOSIS — Z7189 Other specified counseling: Secondary | ICD-10-CM

## 2019-03-30 LAB — CBC WITH DIFFERENTIAL/PLATELET
Abs Immature Granulocytes: 0.01 10*3/uL (ref 0.00–0.07)
Basophils Absolute: 0 10*3/uL (ref 0.0–0.1)
Basophils Relative: 1 %
Eosinophils Absolute: 0.1 10*3/uL (ref 0.0–0.5)
Eosinophils Relative: 4 %
HCT: 42.9 % (ref 36.0–46.0)
Hemoglobin: 14.2 g/dL (ref 12.0–15.0)
Immature Granulocytes: 0 %
Lymphocytes Relative: 31 %
Lymphs Abs: 0.9 10*3/uL (ref 0.7–4.0)
MCH: 29.5 pg (ref 26.0–34.0)
MCHC: 33.1 g/dL (ref 30.0–36.0)
MCV: 89 fL (ref 80.0–100.0)
Monocytes Absolute: 0.2 10*3/uL (ref 0.1–1.0)
Monocytes Relative: 7 %
Neutro Abs: 1.7 10*3/uL (ref 1.7–7.7)
Neutrophils Relative %: 57 %
Platelets: 163 10*3/uL (ref 150–400)
RBC: 4.82 MIL/uL (ref 3.87–5.11)
RDW: 15 % (ref 11.5–15.5)
WBC: 2.9 10*3/uL — ABNORMAL LOW (ref 4.0–10.5)
nRBC: 0 % (ref 0.0–0.2)

## 2019-03-30 LAB — COMPREHENSIVE METABOLIC PANEL
ALT: 27 U/L (ref 0–44)
AST: 38 U/L (ref 15–41)
Albumin: 3.9 g/dL (ref 3.5–5.0)
Alkaline Phosphatase: 158 U/L — ABNORMAL HIGH (ref 38–126)
Anion gap: 8 (ref 5–15)
BUN: 18 mg/dL (ref 8–23)
CO2: 28 mmol/L (ref 22–32)
Calcium: 9 mg/dL (ref 8.9–10.3)
Chloride: 105 mmol/L (ref 98–111)
Creatinine, Ser: 0.89 mg/dL (ref 0.44–1.00)
GFR calc Af Amer: >60 mL/min (ref >60)
GFR calc non Af Amer: >60 mL/min (ref >60)
Glucose, Bld: 88 mg/dL (ref 70–99)
Potassium: 4.2 mmol/L (ref 3.5–5.1)
Sodium: 141 mmol/L (ref 135–145)
Total Bilirubin: 1 mg/dL (ref 0.3–1.2)
Total Protein: 7.6 g/dL (ref 6.5–8.1)

## 2019-03-30 MED ORDER — LACTATED RINGERS IV SOLN
33.5000 mg/m2 | Freq: Once | INTRAVENOUS | Status: AC
  Administered 2019-03-30: 60 mg via INTRAVENOUS
  Filled 2019-03-30: qty 22.5

## 2019-03-30 MED ORDER — HEPARIN SOD (PORK) LOCK FLUSH 100 UNIT/ML IV SOLN
500.0000 [IU] | Freq: Once | INTRAVENOUS | Status: AC | PRN
  Administered 2019-03-30: 14:00:00 500 [IU]

## 2019-03-30 MED ORDER — SODIUM CHLORIDE 0.9 % IV SOLN
20.0000 mg | Freq: Once | INTRAVENOUS | Status: AC
  Administered 2019-03-30: 11:00:00 20 mg via INTRAVENOUS
  Filled 2019-03-30: qty 2

## 2019-03-30 MED ORDER — PEGFILGRASTIM 6 MG/0.6ML ~~LOC~~ PSKT
6.0000 mg | PREFILLED_SYRINGE | Freq: Once | SUBCUTANEOUS | Status: AC
  Administered 2019-03-30: 6 mg via SUBCUTANEOUS
  Filled 2019-03-30: qty 0.6

## 2019-03-30 MED ORDER — FAMOTIDINE IN NACL 20-0.9 MG/50ML-% IV SOLN
20.0000 mg | Freq: Once | INTRAVENOUS | Status: AC
  Administered 2019-03-30: 10:00:00 20 mg via INTRAVENOUS
  Filled 2019-03-30: qty 50

## 2019-03-30 MED ORDER — DIPHENHYDRAMINE HCL 50 MG/ML IJ SOLN
50.0000 mg | Freq: Once | INTRAMUSCULAR | Status: AC
  Administered 2019-03-30: 50 mg via INTRAVENOUS
  Filled 2019-03-30: qty 1

## 2019-03-30 MED ORDER — SODIUM CHLORIDE 0.9 % IV SOLN
Freq: Once | INTRAVENOUS | Status: AC
  Administered 2019-03-30: 10:00:00 via INTRAVENOUS
  Filled 2019-03-30: qty 250

## 2019-03-30 MED ORDER — HEPARIN SOD (PORK) LOCK FLUSH 100 UNIT/ML IV SOLN
INTRAVENOUS | Status: AC
  Filled 2019-03-30: qty 5

## 2019-03-30 MED ORDER — LACTATED RINGERS IV SOLN: 33.5000 mg/m2 | Freq: Once | INTRAVENOUS | Status: DC

## 2019-03-30 NOTE — Progress Notes (Signed)
[facility] OFFICE PROGRESS NOTE  Patient Care Team: Lada, Satira Anis, MD as PCP - General (Family Medicine) Wellington Hampshire, MD as PCP - Cardiology (Cardiology) Byrnett, Forest Gleason, MD (General Surgery) [name], MD (Inactive) as Attending Physician (Internal Medicine)  Cancer Staging No matching staging information was found for the patient.   Oncology History Overview Note  # LEFT BREAST IDC; STAGE II [cT2N1] ER >90%; PR- 50-90%; her 2 NEU- POS; s/p Neoadj chemo; AUG 2016- TCH+P s/p Lumpec & partial ALND- path CR; s/p RT [finished Nov 2016];  adj Herceptin; HELD for Jan 19th 2017 [in DEC 2016-EF dropped from 63 to 51%; FEB 27th EF-42.9%]; JAN 2016 START Arimidex; May 2017- EF- 60%; May 24th 2017-Re-start Herceptin q 3W; July 28th STOP herceptin [finished 43m/m2; sec to Low EF; however 2017 Oct EF= 67%; improved]  # DEC 4th 2017- Start Neratinib 4 pills; DEC 11th 3 pills; STOPPED.  # FEB 2018- RECURRENCE ER/PR positive; Her 2 NEGATIVE [liver Bx]  # MARCH 1st 2018- Tax-Cytoxan [poor tol]  # FEB 2018- Brain mets [SBRT; Dr.Crystal; finished Feb 28th 2018]  # March 2018- Eribulin- poor tolerance  # June 8th 2018- Started Faslodex + Abema [abema-multiple interruptions sec to neutropenia]  # MRI Brain- March 2019- Leptomeningeal mets [WBRT; No LP s/p WBRT- 01/09/2018]; STOP Abema [522mday-sec to severe cytopenia]+faslodex  # MAY 20tth 019- Start Xeloda; STOPPED in Nov 22nd 2019- sec to Progressive liver lesions on CT scan  # NOV 26th 2019- Faslodex + Piqaray 25070m; Jan 2020- Piqray 300 mg+ faslodex; FEB 17th CT scan- mixed response/ Overall progression; STOP Piqray + faslodex.   # FEB 25th 2020- ERIBULIN; Poor tolerance sec to severe neutropenia.  Stopped May 2020.  CT scan May 2020-new/progressive 2 cm liver lesion;   #May 2020 Ixempra-awaiting improvement of counts  # PN- G-2; may 2017- Cymbalta 50m23mMARCH 2018-  acute vascular insuff of BIL LE [?  Taxotere vasoconstriction on asprin]; # hemorragic shock LGIB [s/p colo; Dr.Wohl]  #  SVT- on flecainide.   # April 2016- Liver Bx- NEG;   # Drop in EF from Herceptin [recovered OCt 27th- EF 65%]  # BMD- jan 2017- osteopenia  -----------------------------------------------------------     [place]- Sep 4th 2018- PDL-1 0%/NEG for BRCA; MSI-Stable; Positive for PI3K; ccnd-1; FGF** -----------------------------------------------------------  Dx: Metastatic Breast cancer [ER/PR-Pos; her -2 NEG] Stage IV; goals: palliative Current treatment-holding chemotherapy   Carcinoma of upper-outer quadrant of left breast in female, estrogen receptor positive (HCC)[place]/25/2020 - 01/17/2019 Chemotherapy   The patient had eriBULin mesylate (HALAVEN) 2.1 mg in sodium chloride 0.9 % 100 mL chemo infusion, 1.2 mg/m2 = 2.1 mg (100 % of original dose 1.2 mg/m2), Intravenous,  Once, 3 of 4 cycles Dose modification: 1.2 mg/m2 (original dose 1.2 mg/m2, Cycle 1, Reason: Provider Judgment), 0.8 mg/m2 (original dose 1.2 mg/m2, Cycle 3, Reason: Provider Judgment) Administration: 2.1 mg (11/22/2018), 2.1 mg (11/29/2018), 2.1 mg (12/20/2018), 1.4 mg (01/17/2019)  for chemotherapy treatment.    03/30/2019 -  Chemotherapy   The patient had pegfilgrastim (NEULASTA ONPRO KIT) injection 6 mg, 6 mg, Subcutaneous, Once, 1 of 4 cycles Administration: 6 mg (03/30/2019) ixabepilone (IXEMPRA) 60 mg in lactated ringers 250 mL chemo infusion, 33.5 mg/m2 = 57 mg (100 % of original dose 32 mg/m2), Intravenous,  Once, 1 of 4 cycles Dose modification: 32 mg/m2 (original dose 32 mg/m2, Cycle 1, Reason: Provider Judgment)  for chemotherapy treatment.      INTERVAL HISTORY:  Sarah Horne 63 y.o.  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  Patient denies  abdominal pain nausea vomiting.  Appetite is good with no weight loss.  Continues to have chronic tingling and numbness of the extremities.  No headaches.  No falls.   Review of Systems  Constitutional: Positive for malaise/fatigue. Negative for chills, diaphoresis, fever and weight loss.  HENT: Negative for nosebleeds and sore throat.   Eyes: Negative for double vision.  Respiratory: Negative for cough, hemoptysis, sputum production, shortness of breath and wheezing.   Cardiovascular: Negative for chest pain, palpitations, orthopnea and leg swelling.  Gastrointestinal: Negative for abdominal pain, blood in stool, constipation, heartburn, melena, nausea and vomiting.  Genitourinary: Negative for dysuria, frequency and urgency.  Musculoskeletal: Positive for joint pain. Negative for back pain.  Skin: Negative for itching.  Neurological: Positive for tingling. Negative for dizziness, focal weakness, weakness and headaches.  Endo/Heme/Allergies: Does not bruise/bleed easily.  Psychiatric/Behavioral: Negative for depression. The patient is not nervous/anxious and does not have insomnia.       PAST MEDICAL HISTORY :  Past Medical History:  Diagnosis Date  . Chemotherapy-induced peripheral neuropathy ([place])   . Epistaxis    a. 11/2016 in setting of asa/plavix-->silver nitrate cauterization.  . GI bleed    a. 11/2016 Admission w/ GIB and hypovolemic shock req 3u PRBC's;  b. 11/2016 ECG: gastritis & nonbleeding peptic ulcer; c. 11/2016 Conlonoscopy: rectal and sigmoid colonic ulcers.  . Heart attack ([place])    a. 1998 Cath @ [facility]: reportedly no intervention required.  Marland Kitchen Herceptin-induced cardiomyopathy ([place])    a. In the setting of Herceptin Rx for breast cancer (initiated 12/2014); b. 03/2015 MUGA EF 64%; b. 08/2015 MUGA: EF 51%; c. 10/2015 MUGA: EF 44%; d. 11/2015 Echo: EF 45-50%; e. 01/2016 MUGA: EF 60%; f. 06/2016 MUGA EF 65%; g. 10/2016 MUGA: EF 61%;  h. 12/2016 Echo: EF 55-60%, gr1 DD.  Marland Kitchen  Hyperlipidemia   . Hypertension   . Neuropathy   . Possible PAD (peripheral artery disease) ([place])    a. 11/2016 LE cyanosis and weak pulses-->CTA w/o significant Ao-BiFem dzs. ? distal dzs-->ASA/Plavix initiated by vascular surgery.  Marland Kitchen PSVT (paroxysmal supraventricular tachycardia) ([place])    a. Dx 11/2016.  . Pulmonary embolism ([place])    a. 12/2016 CTA Chest: small nonocclusive PE in inferior segment of the Left lingula, somewhat eccentric filling defect suggesting chronic rather than acute embolic event; b. 02/8340 LE U/S:  No DVT; c. 12/2016 Echo: Nl RV fxn, nl PASP.  Marland Kitchen Recurrent Metastatic breast cancer ([place])    a. Dx 2016: Stage II, ER positive, PR positive, HER-2/neu overexpressing of the left breast-->chemo/radiation; b. 10/2016 CT Abd/pelvis: diffuse liver mets, ill defined sclerotic bone lesions-T12;  c. 10/2016 MRI brain: metastatic lesion along L temporal lobe (19x37m) w/ extensive surrounding edema & 56mmidline shift to right.  . Sinus tachycardia     PAST SURGICAL HISTORY :   Past Surgical History:  Procedure Laterality Date  . BREAST BIOPSY Left 2016   Positive  . BREAST LUMPECTOMY WITH SENTINEL LYMPH NODE BIOPSY Left 05/23/2015   Procedure: LEFT BREAST WIDE EXCISION WITH AXILLARY DISSECTION, MASTOPLASTY ;  Surgeon: JeRobert BellowMD;  Location: [facility] ORS;  Service: General;  Laterality: Left;  . BREAST SURGERY Left 12/18/14   breast biopsy/INVASIVE DUCTAL CARCINOMA OF BREAST, NOTTINGHAM GRADE 2.  . Marland KitchenREAST SURGERY  05/23/2015.   Wide excision/mastoplasty, axillary dissection. No residual invasive cancer, positive for residual DCIS. 0/2 nodes identified on axillary dissection. (no SLN by technetium or methylene blue)  . CARDIAC CATHETERIZATION    . COLONOSCOPY WITH PROPOFOL N/A 12/10/2016   Procedure: COLONOSCOPY WITH PROPOFOL;  Surgeon: Lucilla Lame, MD;  Location: [facility] ENDOSCOPY;  Service: Endoscopy;  Laterality: N/A;  . COLONOSCOPY WITH PROPOFOL N/A 02/13/2017   Procedure:  COLONOSCOPY WITH PROPOFOL;  Surgeon: Lucilla Lame, MD;  [facility];  Service: Endoscopy;  Laterality: N/A;  . ESOPHAGOGASTRODUODENOSCOPY (EGD) WITH PROPOFOL N/A 12/08/2016   Procedure: ESOPHAGOGASTRODUODENOSCOPY (EGD) WITH PROPOFOL;  Surgeon: Lucilla Lame, MD;  Location: [facility] ENDOSCOPY;  Service: Endoscopy;  Laterality: N/A;  . IVC FILTER INSERTION N/A 02/15/2017   Procedure: IVC Filter Insertion;  Surgeon: Algernon Huxley, MD;  Location: [facility] CV LAB;  Service: Cardiovascular;  Laterality: N/A;  . PORTACATH PLACEMENT Right 12-31-14   Dr Bary Castilla    FAMILY HISTORY :   Family History  Problem Relation Age of Onset  . Breast cancer Maternal Aunt   . Breast cancer Cousin   . Brain cancer Maternal Uncle   . Diabetes Mother   . Hypertension Mother   . Stroke Mother     SOCIAL HISTORY:   Social History   Tobacco Use  . Smoking status: Light Tobacco Smoker    Packs/day: 0.50    Years: 18.00    Pack years: 9.00    Types: Cigarettes  . Smokeless tobacco: Never Used  Substance Use Topics  . Alcohol use: No    Alcohol/week: 0.0 standard drinks  . Drug use: No    ALLERGIES:  is allergic to no known allergies.  MEDICATIONS:  Current Outpatient Medications  Medication Sig Dispense Refill  . flecainide (TAMBOCOR) 50 MG tablet Take 1 tablet (50 mg total) by mouth 2 (two) times daily. 180 tablet 3  . magnesium oxide (MAG-OX) 400 MG tablet TAKE 1 TABLET BY MOUTH TWICE A DAY 60 tablet 6  . pregabalin (LYRICA) 75 MG capsule Take 1 capsule (75 mg total) by mouth 2 (two) times daily. 180 capsule 0  . triamcinolone ointment (KENALOG) 0.5 % Apply 1 application topically 2 (two) times daily. 30 g 2  . Vitamin D, Ergocalciferol, (DRISDOL) 1.25 MG (50000 UT) CAPS capsule TAKE 1 CAPSULE (50,000 UNITS TOTAL) ONCE A WEEK BY MOUTH. 12 capsule 1   No current facility-administered medications for this visit.    Facility-Administered Medications Ordered in Other Visits  Medication Dose  Route Frequency Provider Last Rate Last Dose  . 0.9 %  sodium chloride infusion   Intravenous Continuous Pandit, Sandeep, MD      . 0.9 %  sodium chloride infusion   Intravenous Continuous Lloyd Huger, MD 999 mL/hr at 04/24/15 1520    . heparin lock flush 100 unit/mL  500 Units Intravenous Once Berenzon, Dmitriy, MD      . sodium chloride 0.9 % injection 10 mL  10 mL Intravenous PRN [name], MD   10 mL at 04/04/15 1440  . sodium chloride flush (NS) 0.9 % injection 10 mL  10 mL Intravenous PRN Berenzon, Dmitriy, MD        PHYSICAL EXAMINATION: ECOG PERFORMANCE STATUS: 1 - Symptomatic but completely ambulatory  BP (!) 138/94   Pulse 97   Temp (!) 96.5 F (35.8 C)   Resp 16   Wt 154 lb 14.4 oz (70.3 kg)   BMI 25.00 kg/m   [employer]  03/30/19 0840  Weight: 154 lb 14.4 oz (70.3 kg)    Physical Exam  Constitutional: She is oriented to person, place, and time and well-developed, well-nourished, and in no distress.  Alone.  Walking herself.  HENT:  Head: Normocephalic and atraumatic.  Mouth/Throat: Oropharynx is clear and moist. No oropharyngeal exudate.  Eyes: Pupils are equal, round, and reactive to light.  Neck: Normal range of motion. Neck supple.  Cardiovascular: Normal rate and regular rhythm.  Pulmonary/Chest: No respiratory distress. She has no wheezes.  Abdominal: Soft. Bowel sounds are normal. She exhibits no distension and no mass. There is no abdominal tenderness. There is no rebound and no guarding.  Musculoskeletal: Normal range of motion.        General: No tenderness or edema.  Neurological: She is alert and oriented to person, place, and time.  Skin: Skin is warm.  hyperpigmentation of the bilateral hand and feet (better)  Psychiatric: Affect normal.       LABORATORY DATA:  I have reviewed the data as listed    Component Value Date/Time   NA 141 03/30/2019 0818   NA 135 03/04/2017 0918   NA 135 01/08/2015 0850   K 4.2 03/30/2019 0818    K 3.7 01/08/2015 0850   CL 105 03/30/2019 0818   CL 101 01/08/2015 0850   CO2 28 03/30/2019 0818   CO2 26 01/08/2015 0850   GLUCOSE 88 03/30/2019 0818   GLUCOSE 161 (H) 01/08/2015 0850   BUN 18 03/30/2019 0818   BUN 8 03/04/2017 0918   BUN 17 01/08/2015 0850   CREATININE 0.89 03/30/2019 0818   CREATININE 0.82 01/22/2015 1559   CALCIUM 9.0 03/30/2019 0818   CALCIUM 9.3 01/08/2015 0850   PROT 7.6 03/30/2019 0818   PROT 6.4 03/04/2017 0918   PROT 6.8 01/22/2015 1559   ALBUMIN 3.9 03/30/2019 0818   ALBUMIN 2.5 (L) 03/04/2017 0918   ALBUMIN 3.9 01/22/2015 1559   AST 38 03/30/2019 0818   AST 34 01/22/2015 1559   ALT 27 03/30/2019 0818   ALT 42 01/22/2015 1559   ALKPHOS 158 (H) 03/30/2019 0818   ALKPHOS 157 (H) 01/22/2015 1559   BILITOT 1.0 03/30/2019 0818   BILITOT 6.2 (H) 03/04/2017 0918   BILITOT 0.2 (L) 01/22/2015 1559   GFRNONAA >60 03/30/2019 0818   GFRNONAA >60 01/22/2015 1559   GFRAA >60 03/30/2019 0818   GFRAA >60 01/22/2015 1559    No results found for: SPEP, UPEP  Lab Results  Component Value Date   WBC 2.9 (L) 03/30/2019   NEUTROABS 1.7 03/30/2019   HGB 14.2 03/30/2019   HCT 42.9 03/30/2019   MCV 89.0 03/30/2019   PLT 163 03/30/2019      Chemistry      Component Value Date/Time   NA 141 03/30/2019 0818   NA 135 03/04/2017 0918   NA 135 01/08/2015 0850   K 4.2 03/30/2019 0818   K 3.7 01/08/2015 0850   CL 105 03/30/2019 0818   CL 101 01/08/2015 0850   CO2 28 03/30/2019 0818   CO2 26 01/08/2015 0850   BUN 18 03/30/2019 0818   BUN 8 03/04/2017 0918   BUN 17 01/08/2015 0850   CREATININE 0.89 03/30/2019 0818   CREATININE 0.82 01/22/2015 1559      Component Value Date/Time   CALCIUM 9.0 03/30/2019 0818   CALCIUM 9.3 01/08/2015 0850   ALKPHOS 158 (H) 03/30/2019 0818   ALKPHOS 157 (H) 01/22/2015 1559   AST 38 03/30/2019 0818  AST 34 01/22/2015 1559   ALT 27 03/30/2019 0818   ALT 42 01/22/2015 1559   BILITOT 1.0 03/30/2019 0818   BILITOT 6.2  (H) 03/04/2017 0918   BILITOT 0.2 (L) 01/22/2015 1559       RADIOGRAPHIC STUDIES: I have personally reviewed the radiological images as listed and agreed with the findings in the report. No results found.   ASSESSMENT & PLAN:  Carcinoma of upper-outer quadrant of left breast in female, estrogen receptor positive ([place]) # Recurrent metastatic breast cancer-ER PR positive HER-2 negative; most recently on Eribulin [started on 2/25].  June 3rd PET scan shows-up to 3.5 cm left lobe of liver uptake; no evidence of any disease uptake anywhere else.   # proceed with ixempera today. Labs today reviewed;  acceptable for treatment today. Proceed with onpro. ANC 1.7 today.   #Elevated alkaline phosphatase-likely second malignancy.  Stable.  # Brain metastases-recurrent/likely leptomeningeal disease;  S/p WBRT [April 2019]. NOv 19th MRI stable-no progressive leptomeningeal disease.stable.   #Peripheral neuropathy grade 1-  on Lyrica stable.   # DISPOSITION: # chemo today.  #3  weeks-MD; labs-CBC CMP; ixempra/onpro- Dr.B     No orders of the defined types were placed in this encounter.  All questions were answered. The patient knows to call the clinic with any problems, questions or concerns.      Cammie Sickle, MD 04/11/2019 7:56 PM

## 2019-03-30 NOTE — Progress Notes (Signed)
Patient does not offer any problems today and reports her neuropathy is unchanged.  BP in office today is 138/94 HR 97 (has not taken Tambocor today)

## 2019-03-30 NOTE — Assessment & Plan Note (Addendum)
#  Recurrent metastatic breast cancer-ER PR positive HER-2 negative; most recently on Eribulin [started on 2/25].  June 3rd PET scan shows-up to 3.5 cm left lobe of liver uptake; no evidence of any disease uptake anywhere else.   # proceed with ixempera today. Labs today reviewed;  acceptable for treatment today. Proceed with onpro. ANC 1.7 today.   #Elevated alkaline phosphatase-likely second malignancy.  Stable.  # Brain metastases-recurrent/likely leptomeningeal disease;  S/p WBRT [April 2019]. NOv 19th MRI stable-no progressive leptomeningeal disease.stable.   #Peripheral neuropathy grade 1-  on Lyrica stable.   # DISPOSITION: # chemo today.  #3  weeks-MD; labs-CBC CMP; ixempra/onpro- Dr.B

## 2019-04-04 ENCOUNTER — Telehealth: Payer: Self-pay | Admitting: *Deleted

## 2019-04-04 NOTE — Telephone Encounter (Signed)
Patient states on Friday wen she was dressing, she pulled her On Pro off a bit. She states she taped it back down but never heard it beep, nothing ran down her arm,and it continues to flash green. She states she called Saturday and was instructed to bring it back to office Monday. She called today to ask about it and I spoke with Lora Paula, RN in infusion who states that she is out of her window to give her an injection now, but to bring it with her to her next clinic visit. Patient informed of this and states she will bring it with her on the 23 rd

## 2019-04-20 ENCOUNTER — Encounter: Payer: Self-pay | Admitting: Internal Medicine

## 2019-04-20 ENCOUNTER — Inpatient Hospital Stay: Payer: Medicare HMO

## 2019-04-20 ENCOUNTER — Other Ambulatory Visit: Payer: Self-pay

## 2019-04-20 ENCOUNTER — Inpatient Hospital Stay (HOSPITAL_BASED_OUTPATIENT_CLINIC_OR_DEPARTMENT_OTHER): Payer: Medicare HMO | Admitting: Internal Medicine

## 2019-04-20 VITALS — BP 140/90 | HR 98 | Temp 97.1°F | Resp 18 | Ht 66.0 in | Wt 156.4 lb

## 2019-04-20 DIAGNOSIS — C7931 Secondary malignant neoplasm of brain: Secondary | ICD-10-CM | POA: Diagnosis not present

## 2019-04-20 DIAGNOSIS — F17210 Nicotine dependence, cigarettes, uncomplicated: Secondary | ICD-10-CM | POA: Diagnosis not present

## 2019-04-20 DIAGNOSIS — I10 Essential (primary) hypertension: Secondary | ICD-10-CM | POA: Diagnosis not present

## 2019-04-20 DIAGNOSIS — G629 Polyneuropathy, unspecified: Secondary | ICD-10-CM

## 2019-04-20 DIAGNOSIS — C50412 Malignant neoplasm of upper-outer quadrant of left female breast: Secondary | ICD-10-CM | POA: Diagnosis not present

## 2019-04-20 DIAGNOSIS — I252 Old myocardial infarction: Secondary | ICD-10-CM

## 2019-04-20 DIAGNOSIS — Z7902 Long term (current) use of antithrombotics/antiplatelets: Secondary | ICD-10-CM

## 2019-04-20 DIAGNOSIS — Z5189 Encounter for other specified aftercare: Secondary | ICD-10-CM | POA: Diagnosis not present

## 2019-04-20 DIAGNOSIS — Z5111 Encounter for antineoplastic chemotherapy: Secondary | ICD-10-CM | POA: Diagnosis not present

## 2019-04-20 DIAGNOSIS — Z170 Estrogen receptor positive status [ER+]: Secondary | ICD-10-CM

## 2019-04-20 DIAGNOSIS — Z803 Family history of malignant neoplasm of breast: Secondary | ICD-10-CM

## 2019-04-20 DIAGNOSIS — Z86711 Personal history of pulmonary embolism: Secondary | ICD-10-CM

## 2019-04-20 DIAGNOSIS — E785 Hyperlipidemia, unspecified: Secondary | ICD-10-CM

## 2019-04-20 DIAGNOSIS — Z79899 Other long term (current) drug therapy: Secondary | ICD-10-CM

## 2019-04-20 DIAGNOSIS — C787 Secondary malignant neoplasm of liver and intrahepatic bile duct: Secondary | ICD-10-CM | POA: Diagnosis not present

## 2019-04-20 DIAGNOSIS — Z7189 Other specified counseling: Secondary | ICD-10-CM

## 2019-04-20 LAB — COMPREHENSIVE METABOLIC PANEL
ALT: 30 U/L (ref 0–44)
AST: 36 U/L (ref 15–41)
Albumin: 3.8 g/dL (ref 3.5–5.0)
Alkaline Phosphatase: 137 U/L — ABNORMAL HIGH (ref 38–126)
Anion gap: 6 (ref 5–15)
BUN: 17 mg/dL (ref 8–23)
CO2: 26 mmol/L (ref 22–32)
Calcium: 8.9 mg/dL (ref 8.9–10.3)
Chloride: 107 mmol/L (ref 98–111)
Creatinine, Ser: 0.67 mg/dL (ref 0.44–1.00)
GFR calc Af Amer: >60 mL/min (ref >60)
GFR calc non Af Amer: >60 mL/min (ref >60)
Glucose, Bld: 89 mg/dL (ref 70–99)
Potassium: 4 mmol/L (ref 3.5–5.1)
Sodium: 139 mmol/L (ref 135–145)
Total Bilirubin: 0.9 mg/dL (ref 0.3–1.2)
Total Protein: 7.2 g/dL (ref 6.5–8.1)

## 2019-04-20 LAB — CBC WITH DIFFERENTIAL/PLATELET
Abs Immature Granulocytes: 0.01 10*3/uL (ref 0.00–0.07)
Basophils Absolute: 0 10*3/uL (ref 0.0–0.1)
Basophils Relative: 0 %
Eosinophils Absolute: 0 10*3/uL (ref 0.0–0.5)
Eosinophils Relative: 1 %
HCT: 40.7 % (ref 36.0–46.0)
Hemoglobin: 13.5 g/dL (ref 12.0–15.0)
Immature Granulocytes: 0 %
Lymphocytes Relative: 33 %
Lymphs Abs: 1.1 10*3/uL (ref 0.7–4.0)
MCH: 29.3 pg (ref 26.0–34.0)
MCHC: 33.2 g/dL (ref 30.0–36.0)
MCV: 88.5 fL (ref 80.0–100.0)
Monocytes Absolute: 0.3 10*3/uL (ref 0.1–1.0)
Monocytes Relative: 10 %
Neutro Abs: 1.8 10*3/uL (ref 1.7–7.7)
Neutrophils Relative %: 56 %
Platelets: 234 10*3/uL (ref 150–400)
RBC: 4.6 MIL/uL (ref 3.87–5.11)
RDW: 15.9 % — ABNORMAL HIGH (ref 11.5–15.5)
WBC: 3.3 10*3/uL — ABNORMAL LOW (ref 4.0–10.5)
nRBC: 0 % (ref 0.0–0.2)

## 2019-04-20 MED ORDER — HEPARIN SOD (PORK) LOCK FLUSH 100 UNIT/ML IV SOLN
500.0000 [IU] | Freq: Once | INTRAVENOUS | Status: AC | PRN
  Administered 2019-04-20: 14:00:00 500 [IU]
  Filled 2019-04-20: qty 5

## 2019-04-20 MED ORDER — SODIUM CHLORIDE 0.9% FLUSH
10.0000 mL | Freq: Once | INTRAVENOUS | Status: AC
  Administered 2019-04-20: 10 mL via INTRAVENOUS
  Filled 2019-04-20: qty 10

## 2019-04-20 MED ORDER — LACTATED RINGERS IV SOLN
40.0000 mg/m2 | Freq: Once | INTRAVENOUS | Status: AC
  Administered 2019-04-20: 72 mg via INTRAVENOUS
  Filled 2019-04-20: qty 36

## 2019-04-20 MED ORDER — FAMOTIDINE IN NACL 20-0.9 MG/50ML-% IV SOLN
20.0000 mg | Freq: Once | INTRAVENOUS | Status: AC
  Administered 2019-04-20: 20 mg via INTRAVENOUS
  Filled 2019-04-20: qty 50

## 2019-04-20 MED ORDER — DIPHENHYDRAMINE HCL 50 MG/ML IJ SOLN
50.0000 mg | Freq: Once | INTRAMUSCULAR | Status: AC
  Administered 2019-04-20: 50 mg via INTRAVENOUS
  Filled 2019-04-20: qty 1

## 2019-04-20 MED ORDER — SODIUM CHLORIDE 0.9 % IV SOLN
Freq: Once | INTRAVENOUS | Status: AC
  Administered 2019-04-20: 09:00:00 via INTRAVENOUS
  Filled 2019-04-20: qty 250

## 2019-04-20 MED ORDER — PEGFILGRASTIM 6 MG/0.6ML ~~LOC~~ PSKT
6.0000 mg | PREFILLED_SYRINGE | Freq: Once | SUBCUTANEOUS | Status: AC
  Administered 2019-04-20: 6 mg via SUBCUTANEOUS
  Filled 2019-04-20: qty 0.6

## 2019-04-20 MED ORDER — SODIUM CHLORIDE 0.9 % IV SOLN
20.0000 mg | Freq: Once | INTRAVENOUS | Status: AC
  Administered 2019-04-20: 20 mg via INTRAVENOUS
  Filled 2019-04-20: qty 2

## 2019-04-20 NOTE — Assessment & Plan Note (Signed)
#  Recurrent metastatic breast cancer-ER PR positive HER-2 negative; June 3rd PET scan shows-up to 3.5 cm left lobe of liver uptake; no evidence of any disease uptake anywhere else. Currently on ixempera today.   # Proceed with cycle #2 today. Labs today reviewed;  acceptable for treatment today. Proceed with onpro. ANC 1.8 today.   #Elevated alkaline phosphatase-likely second malignancy. Improving.   # Brain metastases-recurrent/likely leptomeningeal disease;  S/p WBRT [April 2019]. NOv 19th MRI stable-no progressive leptomeningeal disease.; stable. Will repeat MRI brain at next visit.   #Peripheral neuropathy grade 1-  on Lyrica stable.   # DISPOSITION: # chemo today.  #3 weeks-MD; labs-CBC CMP; ixempra/onpro; MRI brain prior- Dr.B

## 2019-04-20 NOTE — Progress Notes (Signed)
[place] OFFICE PROGRESS NOTE  Patient Care Team: Lada, Satira Anis, MD as PCP - General (Family Medicine) Wellington [place], MD as PCP - Cardiology (Cardiology) Byrnett, Forest Gleason, MD (General Surgery) [name], MD (Inactive) as Attending Physician (Internal Medicine)  Cancer Staging No matching staging information was found for the patient.   Oncology History Overview Note  # LEFT BREAST IDC; STAGE II [cT2N1] ER >90%; PR- 50-90%; her 2 NEU- POS; s/p Neoadj chemo; AUG 2016- TCH+P s/p Lumpec & partial ALND- path CR; s/p RT [finished Nov 2016];  adj Herceptin; HELD for Jan 19th 2017 [in DEC 2016-EF dropped from 63 to 51%; FEB 27th EF-42.9%]; JAN 2016 START Arimidex; May 2017- EF- 60%; May 24th 2017-Re-start Herceptin q 3W; July 28th STOP herceptin [finished 46m/m2; sec to Low EF; however 2017 Oct EF= 67%; improved]  # DEC 4th 2017- Start Neratinib 4 pills; DEC 11th 3 pills; STOPPED.  # FEB 2018- RECURRENCE ER/PR positive; Her 2 NEGATIVE [liver Bx]  # MARCH 1st 2018- Tax-Cytoxan [poor tol]  # FEB 2018- Brain mets [SBRT; Dr.Crystal; finished Feb 28th 2018]  # March 2018- Eribulin- poor tolerance  # June 8th 2018- Started Faslodex + Abema [abema-multiple interruptions sec to neutropenia]  # MRI Brain- March 2019- Leptomeningeal mets [WBRT; No LP s/p WBRT- 01/09/2018]; STOP Abema [534mday-sec to severe cytopenia]+faslodex  # MAY 20tth 019- Start Xeloda; STOPPED in Nov 22nd 2019- sec to Progressive liver lesions on CT scan  # NOV 26th 2019- Faslodex + Piqaray 25086m; Jan 2020- Piqray 300 mg+ faslodex; FEB 17th CT scan- mixed response/ Overall progression; STOP Piqray + faslodex.   # FEB 25th 2020- ERIBULIN; Poor tolerance sec to severe neutropenia.  Stopped May 2020.  CT scan May 2020-new/progressive 2 cm liver lesion;   #July nd 2020-Ixempra  # PN- G-2; may 2017- Cymbalta 58m78mMARCH 2018-  acute vascular insuff of BIL LE [? Taxotere vasoconstriction on  asprin]; # hemorragic shock LGIB [s/p colo; Dr.Wohl]  #  SVT- on flecainide.   # April 2016- Liver Bx- NEG;   # Drop in EF from Herceptin [recovered OCt 27th- EF 65%]  # BMD- jan 2017- osteopenia  -----------------------------------------------------------     [place]- Sep 4th 2018- PDL-1 0%/NEG for BRCA; MSI-Stable; Positive for PI3K; ccnd-1; FGF** -----------------------------------------------------------  Dx: Metastatic Breast cancer [ER/PR-Pos; her -2 NEG] Stage IV; goals: palliative Current treatment-Ixempra   Carcinoma of upper-outer quadrant of left breast in [place], estrogen receptor positive (HCC)[place]/25/2020 - 01/17/2019 Chemotherapy   The patient had eriBULin mesylate ([place]) 2.1 mg in sodium chloride 0.9 % 100 mL chemo infusion, 1.2 mg/m2 = 2.1 mg (100 % of original dose 1.2 mg/m2), Intravenous,  Once, 3 of 4 cycles Dose modification: 1.2 mg/m2 (original dose 1.2 mg/m2, Cycle 1, Reason: Provider Judgment), 0.8 mg/m2 (original dose 1.2 mg/m2, Cycle 3, Reason: Provider Judgment) Administration: 2.1 mg (11/22/2018), 2.1 mg (11/29/2018), 2.1 mg (12/20/2018), 1.4 mg (01/17/2019)  for chemotherapy treatment.    03/30/2019 -  Chemotherapy   The patient had pegfilgrastim (NEULASTA ONPRO KIT) injection 6 mg, 6 mg, Subcutaneous, Once, 2 of 4 cycles Administration: 6 mg (03/30/2019) ixabepilone (IXEMPRA) 60 mg in lactated ringers 250 mL chemo infusion, 33.5 mg/m2 = 57 mg (100 % of original dose 32 mg/m2), Intravenous,  Once, 2 of 4 cycles Dose modification: 32 mg/m2 (original dose 32 mg/m2, Cycle 1, Reason: Provider Judgment) Administration: 72 mg (04/20/2019)  for chemotherapy treatment.      INTERVAL HISTORY:  Sarah [place] 63 y.o.  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  Patient received cycle #1 of  Ixempra 3 weeks ago.   Denies any nausea vomiting.  Denies any new headaches.  Chronic mild tingling and numbness in extremities.  No falls.  No swelling in legs.  No fevers or chills.   Review of Systems  Constitutional: Positive for malaise/fatigue. Negative for chills, diaphoresis, fever and weight loss.  HENT: Negative for nosebleeds and sore throat.   Eyes: Negative for double vision.  Respiratory: Negative for cough, hemoptysis, sputum production, shortness of breath and wheezing.   Cardiovascular: Negative for chest pain, palpitations, orthopnea and leg swelling.  Gastrointestinal: Negative for abdominal pain, blood in stool, constipation, heartburn, melena, nausea and vomiting.  Genitourinary: Negative for dysuria, frequency and urgency.  Musculoskeletal: Positive for joint pain. Negative for back pain.  Skin: Negative for itching.  Neurological: Positive for tingling. Negative for dizziness, focal weakness, weakness and headaches.  Endo/Heme/Allergies: Does not bruise/bleed easily.  Psychiatric/Behavioral: Negative for depression. The patient is not nervous/anxious and does not have insomnia.       PAST MEDICAL HISTORY :  Past Medical History:  Diagnosis Date  . Chemotherapy-induced peripheral neuropathy ([place])   . Epistaxis    a. 11/2016 in setting of asa/plavix-->silver nitrate cauterization.  . GI bleed    a. 11/2016 Admission w/ GIB and hypovolemic shock req 3u PRBC's;  b. 11/2016 ECG: gastritis & nonbleeding peptic ulcer; c. 11/2016 Conlonoscopy: rectal and sigmoid colonic ulcers.  . Heart attack ([place])    a. 1998 Cath @ [facility]: reportedly no intervention required.  Marland Kitchen Herceptin-induced cardiomyopathy ([place])    a. In the setting of Herceptin Rx for breast cancer (initiated 12/2014); b. 03/2015 MUGA EF 64%; b. 08/2015 MUGA: EF 51%; c. 10/2015 MUGA: EF 44%; d. 11/2015 Echo: EF 45-50%; e. 01/2016 MUGA: EF 60%; f. 06/2016 MUGA EF 65%; g. 10/2016 MUGA: EF 61%;  h. 12/2016 Echo: EF 55-60%, gr1  DD.  Marland Kitchen Hyperlipidemia   . Hypertension   . Neuropathy   . Possible PAD (peripheral artery disease) ([place])    a. 11/2016 LE cyanosis and weak pulses-->CTA w/o significant Ao-BiFem dzs. ? distal dzs-->ASA/Plavix initiated by vascular surgery.  Marland Kitchen PSVT (paroxysmal supraventricular tachycardia) ([place])    a. Dx 11/2016.  . Pulmonary embolism ([place])    a. 12/2016 CTA Chest: small nonocclusive PE in inferior segment of the Left lingula, somewhat eccentric filling defect suggesting chronic rather than acute embolic event; b. 04/8827 LE U/S:  No DVT; c. 12/2016 Echo: Nl RV fxn, nl PASP.  Marland Kitchen Recurrent Metastatic breast cancer ([place])    a. Dx 2016: Stage II, ER positive, PR positive, HER-2/neu overexpressing of the left breast-->chemo/radiation; b. 10/2016 CT Abd/pelvis: diffuse liver mets, ill defined sclerotic bone lesions-T12;  c. 10/2016 MRI brain: metastatic lesion along L temporal lobe (19x98m) w/ extensive surrounding edema & 564mmidline shift to right.  . Sinus tachycardia     PAST SURGICAL HISTORY :   Past Surgical History:  Procedure Laterality Date  . BREAST BIOPSY Left 2016   Positive  . BREAST LUMPECTOMY WITH SENTINEL LYMPH NODE BIOPSY Left 05/23/2015   Procedure: LEFT BREAST WIDE EXCISION WITH AXILLARY DISSECTION, MASTOPLASTY ;  Surgeon: JeRobert BellowMD;  Location: [facility] ORS;  Service: General;  Laterality: Left;  . BREAST SURGERY Left 12/18/14   breast biopsy/INVASIVE DUCTAL  CARCINOMA OF BREAST, NOTTINGHAM GRADE 2.  Marland Kitchen BREAST SURGERY  05/23/2015.   Wide excision/mastoplasty, axillary dissection. No residual invasive cancer, positive for residual DCIS. 0/2 nodes identified on axillary dissection. (no SLN by technetium or methylene blue)  . CARDIAC CATHETERIZATION    . COLONOSCOPY WITH PROPOFOL N/A 12/10/2016   Procedure: COLONOSCOPY WITH PROPOFOL;  Surgeon: Lucilla Lame, MD;  Location: [facility] ENDOSCOPY;  Service: Endoscopy;  Laterality: N/A;  . COLONOSCOPY WITH PROPOFOL N/A 02/13/2017   Procedure:  COLONOSCOPY WITH PROPOFOL;  Surgeon: Lucilla Lame, MD;  Location[facility] ENDOSCOPY;  Service: Endoscopy;  Laterality: N/A;  . ESOPHAGOGASTRODUODENOSCOPY (EGD) WITH PROPOFOL N/A 12/08/2016   Procedure: ESOPHAGOGASTRODUODENOSCOPY (EGD) WITH PROPOFOL;  Surgeon: Lucilla Lame, MD;  Location: [facility] ENDOSCOPY;  Service: Endoscopy;  Laterality: N/A;  . IVC FILTER INSERTION N/A 02/15/2017   Procedure: IVC Filter Insertion;  Surgeon: Algernon Huxley, MD;  Location: [facility] LAB;  Service: Cardiovascular;  Laterality: N/A;  . PORTACATH PLACEMENT Right 12-31-14   Dr Bary Castilla    FAMILY HISTORY :   Family History  Problem Relation Age of Onset  . Breast cancer Maternal Aunt   . Breast cancer Cousin   . Brain cancer Maternal Uncle   . Diabetes Mother   . Hypertension Mother   . Stroke Mother     SOCIAL HISTORY:   Social History   Tobacco Use  . Smoking status: Light Tobacco Smoker    Packs/day: 0.50    Years: 18.00    Pack years: 9.00    Types: Cigarettes  . Smokeless tobacco: Never Used  Substance Use Topics  . Alcohol use: No    Alcohol/week: 0.0 standard drinks  . Drug use: No    ALLERGIES:  is allergic to no known allergies.  MEDICATIONS:  Current Outpatient Medications  Medication Sig Dispense Refill  . flecainide (TAMBOCOR) 50 MG tablet Take 1 tablet (50 mg total) by mouth 2 (two) times daily. 180 tablet 3  . magnesium oxide (MAG-OX) 400 MG tablet TAKE 1 TABLET BY MOUTH TWICE A DAY 60 tablet 6  . pregabalin (LYRICA) 75 MG capsule Take 1 capsule (75 mg total) by mouth 2 (two) times daily. 180 capsule 0  . triamcinolone ointment (KENALOG) 0.5 % Apply 1 application topically 2 (two) times daily. 30 g 2  . Vitamin D, Ergocalciferol, (DRISDOL) 1.25 MG (50000 UT) CAPS capsule TAKE 1 CAPSULE (50,000 UNITS TOTAL) ONCE A WEEK BY MOUTH. 12 capsule 1   No current facility-administered medications for this visit.    Facility-Administered Medications Ordered in Other Visits  Medication Dose  Route Frequency Provider Last Rate Last Dose  . 0.9 %  sodium chloride infusion   Intravenous Continuous Pandit, Sandeep, MD      . 0.9 %  sodium chloride infusion   Intravenous Continuous Lloyd Huger, MD 999 mL/hr at 04/24/15 1520    . heparin lock flush 100 unit/mL  500 Units Intravenous Once Berenzon, Dmitriy, MD      . heparin lock flush 100 unit/mL  500 Units Intracatheter Once PRN Cammie Sickle, MD      . ixabepilone (IXEMPRA) 72 mg in lactated ringers 250 mL chemo infusion  40 mg/m2 (Treatment Plan Recorded) Intravenous Once Cammie Sickle, MD 95 mL/hr at 04/20/19 1018 72 mg at 04/20/19 1018  . pegfilgrastim (NEULASTA ONPRO KIT) injection 6 mg  6 mg Subcutaneous Once Charlaine Dalton R, MD      . sodium chloride 0.9 % injection 10 mL  10  mL Intravenous PRN [name], MD   10 mL at 04/04/15 1440  . sodium chloride flush (NS) 0.9 % injection 10 mL  10 mL Intravenous PRN Berenzon, Dmitriy, MD        PHYSICAL EXAMINATION: ECOG PERFORMANCE STATUS: 1 - Symptomatic but completely ambulatory  BP 140/90 (BP Location: Right Arm) Comment: manual recheck  Pulse 98   Temp (!) 97.1 F (36.2 C) (Tympanic)   Resp 18   Ht 5' 6"  (1.676 m)   Wt 156 lb 6.4 oz (70.9 kg)   BMI 25.24 kg/m   Filed Weights   04/20/19 0830  Weight: 156 lb 6.4 oz (70.9 kg)    Physical Exam  Constitutional: She is oriented to person, place, and time and well-developed, well-nourished, and in no distress.  Alone.  Walking herself.  HENT:  Head: Normocephalic and atraumatic.  Mouth/Throat: Oropharynx is clear and moist. No oropharyngeal exudate.  Eyes: Pupils are equal, round, and reactive to light.  Neck: Normal range of motion. Neck supple.  Cardiovascular: Normal rate and regular rhythm.  Pulmonary/Chest: No respiratory distress. She has no wheezes.  Abdominal: Soft. Bowel sounds are normal. She exhibits no distension and no mass. There is no abdominal tenderness. There is no  rebound and no guarding.  Musculoskeletal: Normal range of motion.        General: No tenderness or edema.  Neurological: She is alert and oriented to person, place, and time.  Skin: Skin is warm.  hyperpigmentation of the bilateral hand and feet (better)  Psychiatric: Affect normal.       LABORATORY DATA:  I have reviewed the data as listed    Component Value Date/Time   NA 139 04/20/2019 0815   NA 135 03/04/2017 0918   NA 135 01/08/2015 0850   K 4.0 04/20/2019 0815   K 3.7 01/08/2015 0850   CL 107 04/20/2019 0815   CL 101 01/08/2015 0850   CO2 26 04/20/2019 0815   CO2 26 01/08/2015 0850   GLUCOSE 89 04/20/2019 0815   GLUCOSE 161 (H) 01/08/2015 0850   BUN 17 04/20/2019 0815   BUN 8 03/04/2017 0918   BUN 17 01/08/2015 0850   CREATININE 0.67 04/20/2019 0815   CREATININE 0.82 01/22/2015 1559   CALCIUM 8.9 04/20/2019 0815   CALCIUM 9.3 01/08/2015 0850   PROT 7.2 04/20/2019 0815   PROT 6.4 03/04/2017 0918   PROT 6.8 01/22/2015 1559   ALBUMIN 3.8 04/20/2019 0815   ALBUMIN 2.5 (L) 03/04/2017 0918   ALBUMIN 3.9 01/22/2015 1559   AST 36 04/20/2019 0815   AST 34 01/22/2015 1559   ALT 30 04/20/2019 0815   ALT 42 01/22/2015 1559   ALKPHOS 137 (H) 04/20/2019 0815   ALKPHOS 157 (H) 01/22/2015 1559   BILITOT 0.9 04/20/2019 0815   BILITOT 6.2 (H) 03/04/2017 0918   BILITOT 0.2 (L) 01/22/2015 1559   GFRNONAA >60 04/20/2019 0815   GFRNONAA >60 01/22/2015 1559   GFRAA >60 04/20/2019 0815   GFRAA >60 01/22/2015 1559    No results found for: SPEP, UPEP  Lab Results  Component Value Date   WBC 3.3 (L) 04/20/2019   NEUTROABS 1.8 04/20/2019   HGB 13.5 04/20/2019   HCT 40.7 04/20/2019   MCV 88.5 04/20/2019   PLT 234 04/20/2019      Chemistry      Component Value Date/Time   NA 139 04/20/2019 0815   NA 135 03/04/2017 0918   NA 135 01/08/2015 0850   K 4.0 04/20/2019  0815   K 3.7 01/08/2015 0850   CL 107 04/20/2019 0815   CL 101 01/08/2015 0850   CO2 26 04/20/2019  0815   CO2 26 01/08/2015 0850   BUN 17 04/20/2019 0815   BUN 8 03/04/2017 0918   BUN 17 01/08/2015 0850   CREATININE 0.67 04/20/2019 0815   CREATININE 0.82 01/22/2015 1559      Component Value Date/Time   CALCIUM 8.9 04/20/2019 0815   CALCIUM 9.3 01/08/2015 0850   ALKPHOS 137 (H) 04/20/2019 0815   ALKPHOS 157 (H) 01/22/2015 1559   AST 36 04/20/2019 0815   AST 34 01/22/2015 1559   ALT 30 04/20/2019 0815   ALT 42 01/22/2015 1559   BILITOT 0.9 04/20/2019 0815   BILITOT 6.2 (H) 03/04/2017 0918   BILITOT 0.2 (L) 01/22/2015 1559       RADIOGRAPHIC STUDIES: I have personally reviewed the radiological images as listed and agreed with the findings in the report. No results found.   ASSESSMENT & PLAN:  Carcinoma of upper-outer quadrant of left breast in [place], estrogen receptor positive ([place]) # Recurrent metastatic breast cancer-ER PR positive HER-2 negative; June 3rd PET scan shows-up to 3.5 cm left lobe of liver uptake; no evidence of any disease uptake anywhere else. Currently on ixempera today.   # Proceed with cycle #2 today. Labs today reviewed;  acceptable for treatment today. Proceed with onpro. ANC 1.8 today.   #Elevated alkaline phosphatase-likely second malignancy. Improving.   # Brain metastases-recurrent/likely leptomeningeal disease;  S/p WBRT [April 2019]. NOv 19th MRI stable-no progressive leptomeningeal disease.; stable. Will repeat MRI brain at next visit.   #Peripheral neuropathy grade 1-  on Lyrica stable.   # DISPOSITION: # chemo today.  #3 weeks-MD; labs-CBC CMP; ixempra/onpro; MRI brain prior- Dr.B     Orders Placed This Encounter  Procedures  . MR Brain W Wo Contrast    Standing Status:   Future    Standing Expiration Date:   04/19/2020    Order Specific Question:   ** REASON FOR EXAM (FREE TEXT)    Answer:   brain metastases; breast cancer    Order Specific Question:   If indicated for the ordered procedure, I authorize the administration of  contrast media per Radiology protocol    Answer:   Yes    Order Specific Question:   What is the patient's sedation requirement?    Answer:   No Sedation    Order Specific Question:   Does the patient have a pacemaker or implanted devices?    Answer:   No    Order Specific Question:   Use SRS Protocol?    Answer:   No    Order Specific Question:   Radiology Contrast Protocol - do NOT remove file path    Answer:   [email or web address]    Order Specific Question:   Preferred imaging location?    Answer:   [facility] (table limit-400lbs)   All questions were answered. The patient knows to call the clinic with any problems, questions or concerns.      Cammie Sickle, MD 04/20/2019 12:24 PM

## 2019-05-09 ENCOUNTER — Other Ambulatory Visit: Payer: Self-pay

## 2019-05-09 ENCOUNTER — Ambulatory Visit
Admission: RE | Admit: 2019-05-09 | Discharge: 2019-05-09 | Disposition: A | Discharge status: Home / Self Care | Payer: Medicare HMO | Source: Ambulatory Visit | Attending: Internal Medicine | Admitting: Internal Medicine

## 2019-05-09 DIAGNOSIS — Z170 Estrogen receptor positive status [ER+]: Secondary | ICD-10-CM | POA: Diagnosis not present

## 2019-05-09 DIAGNOSIS — C50412 Malignant neoplasm of upper-outer quadrant of left female breast: Secondary | ICD-10-CM | POA: Diagnosis not present

## 2019-05-09 DIAGNOSIS — C3490 Malignant neoplasm of unspecified part of unspecified bronchus or lung: Secondary | ICD-10-CM | POA: Diagnosis not present

## 2019-05-09 DIAGNOSIS — G939 Disorder of brain, unspecified: Secondary | ICD-10-CM | POA: Diagnosis not present

## 2019-05-09 DIAGNOSIS — C7931 Secondary malignant neoplasm of brain: Secondary | ICD-10-CM | POA: Diagnosis not present

## 2019-05-09 MED ORDER — GADOBUTROL 1 MMOL/ML IV SOLN
7.0000 mL | Freq: Once | INTRAVENOUS | Status: AC | PRN
  Administered 2019-05-09: 7 mL via INTRAVENOUS

## 2019-05-10 ENCOUNTER — Other Ambulatory Visit: Payer: Self-pay

## 2019-05-11 ENCOUNTER — Inpatient Hospital Stay: Payer: Medicare HMO | Attending: Internal Medicine

## 2019-05-11 ENCOUNTER — Inpatient Hospital Stay (HOSPITAL_BASED_OUTPATIENT_CLINIC_OR_DEPARTMENT_OTHER): Payer: Medicare HMO | Admitting: Internal Medicine

## 2019-05-11 ENCOUNTER — Inpatient Hospital Stay: Payer: Medicare HMO

## 2019-05-11 ENCOUNTER — Other Ambulatory Visit: Payer: Self-pay

## 2019-05-11 DIAGNOSIS — C787 Secondary malignant neoplasm of liver and intrahepatic bile duct: Secondary | ICD-10-CM | POA: Insufficient documentation

## 2019-05-11 DIAGNOSIS — R5383 Other fatigue: Secondary | ICD-10-CM | POA: Diagnosis not present

## 2019-05-11 DIAGNOSIS — G620 Drug-induced polyneuropathy: Secondary | ICD-10-CM | POA: Insufficient documentation

## 2019-05-11 DIAGNOSIS — R5381 Other malaise: Secondary | ICD-10-CM | POA: Insufficient documentation

## 2019-05-11 DIAGNOSIS — Z79899 Other long term (current) drug therapy: Secondary | ICD-10-CM | POA: Diagnosis not present

## 2019-05-11 DIAGNOSIS — C50412 Malignant neoplasm of upper-outer quadrant of left female breast: Secondary | ICD-10-CM | POA: Insufficient documentation

## 2019-05-11 DIAGNOSIS — Z86711 Personal history of pulmonary embolism: Secondary | ICD-10-CM | POA: Insufficient documentation

## 2019-05-11 DIAGNOSIS — Z9221 Personal history of antineoplastic chemotherapy: Secondary | ICD-10-CM | POA: Insufficient documentation

## 2019-05-11 DIAGNOSIS — Z170 Estrogen receptor positive status [ER+]: Secondary | ICD-10-CM

## 2019-05-11 DIAGNOSIS — R748 Abnormal levels of other serum enzymes: Secondary | ICD-10-CM | POA: Diagnosis not present

## 2019-05-11 DIAGNOSIS — I252 Old myocardial infarction: Secondary | ICD-10-CM | POA: Diagnosis not present

## 2019-05-11 DIAGNOSIS — T451X5A Adverse effect of antineoplastic and immunosuppressive drugs, initial encounter: Secondary | ICD-10-CM | POA: Insufficient documentation

## 2019-05-11 DIAGNOSIS — F17210 Nicotine dependence, cigarettes, uncomplicated: Secondary | ICD-10-CM | POA: Diagnosis not present

## 2019-05-11 DIAGNOSIS — C7931 Secondary malignant neoplasm of brain: Secondary | ICD-10-CM | POA: Diagnosis not present

## 2019-05-11 DIAGNOSIS — Z79818 Long term (current) use of other agents affecting estrogen receptors and estrogen levels: Secondary | ICD-10-CM | POA: Diagnosis not present

## 2019-05-11 DIAGNOSIS — Z5111 Encounter for antineoplastic chemotherapy: Secondary | ICD-10-CM | POA: Diagnosis not present

## 2019-05-11 DIAGNOSIS — Z7189 Other specified counseling: Secondary | ICD-10-CM

## 2019-05-11 LAB — COMPREHENSIVE METABOLIC PANEL
ALT: 34 U/L (ref 0–44)
AST: 36 U/L (ref 15–41)
Albumin: 3.9 g/dL (ref 3.5–5.0)
Alkaline Phosphatase: 143 U/L — ABNORMAL HIGH (ref 38–126)
Anion gap: 8 (ref 5–15)
BUN: 15 mg/dL (ref 8–23)
CO2: 26 mmol/L (ref 22–32)
Calcium: 8.9 mg/dL (ref 8.9–10.3)
Chloride: 106 mmol/L (ref 98–111)
Creatinine, Ser: 0.78 mg/dL (ref 0.44–1.00)
GFR calc Af Amer: >60 mL/min (ref >60)
GFR calc non Af Amer: >60 mL/min (ref >60)
Glucose, Bld: 89 mg/dL (ref 70–99)
Potassium: 3.7 mmol/L (ref 3.5–5.1)
Sodium: 140 mmol/L (ref 135–145)
Total Bilirubin: 1.1 mg/dL (ref 0.3–1.2)
Total Protein: 7.1 g/dL (ref 6.5–8.1)

## 2019-05-11 LAB — CBC WITH DIFFERENTIAL/PLATELET
Abs Immature Granulocytes: 0.01 10*3/uL (ref 0.00–0.07)
Basophils Absolute: 0 10*3/uL (ref 0.0–0.1)
Basophils Relative: 1 %
Eosinophils Absolute: 0 10*3/uL (ref 0.0–0.5)
Eosinophils Relative: 1 %
HCT: 40.7 % (ref 36.0–46.0)
Hemoglobin: 13.5 g/dL (ref 12.0–15.0)
Immature Granulocytes: 0 %
Lymphocytes Relative: 26 %
Lymphs Abs: 0.8 10*3/uL (ref 0.7–4.0)
MCH: 29.1 pg (ref 26.0–34.0)
MCHC: 33.2 g/dL (ref 30.0–36.0)
MCV: 87.7 fL (ref 80.0–100.0)
Monocytes Absolute: 0.5 10*3/uL (ref 0.1–1.0)
Monocytes Relative: 16 %
Neutro Abs: 1.7 10*3/uL (ref 1.7–7.7)
Neutrophils Relative %: 56 %
Platelets: 348 10*3/uL (ref 150–400)
RBC: 4.64 MIL/uL (ref 3.87–5.11)
RDW: 17.6 % — ABNORMAL HIGH (ref 11.5–15.5)
WBC: 3 10*3/uL — ABNORMAL LOW (ref 4.0–10.5)
nRBC: 0 % (ref 0.0–0.2)

## 2019-05-11 MED ORDER — SODIUM CHLORIDE 0.9% FLUSH
10.0000 mL | INTRAVENOUS | Status: DC | PRN
  Administered 2019-05-11: 10 mL via INTRAVENOUS
  Filled 2019-05-11: qty 10

## 2019-05-11 MED ORDER — DIPHENHYDRAMINE HCL 50 MG/ML IJ SOLN
50.0000 mg | Freq: Once | INTRAMUSCULAR | Status: AC
  Administered 2019-05-11: 50 mg via INTRAVENOUS
  Filled 2019-05-11: qty 1

## 2019-05-11 MED ORDER — HEPARIN SOD (PORK) LOCK FLUSH 100 UNIT/ML IV SOLN
500.0000 [IU] | Freq: Once | INTRAVENOUS | Status: AC
  Administered 2019-05-11: 14:00:00 500 [IU] via INTRAVENOUS
  Filled 2019-05-11: qty 5

## 2019-05-11 MED ORDER — LACTATED RINGERS IV SOLN
40.0000 mg/m2 | Freq: Once | INTRAVENOUS | Status: AC
  Administered 2019-05-11: 72 mg via INTRAVENOUS
  Filled 2019-05-11: qty 22.5

## 2019-05-11 MED ORDER — SODIUM CHLORIDE 0.9 % IV SOLN
20.0000 mg | Freq: Once | INTRAVENOUS | Status: AC
  Administered 2019-05-11: 20 mg via INTRAVENOUS
  Filled 2019-05-11: qty 2

## 2019-05-11 MED ORDER — FAMOTIDINE IN NACL 20-0.9 MG/50ML-% IV SOLN
20.0000 mg | Freq: Once | INTRAVENOUS | Status: AC
  Administered 2019-05-11: 20 mg via INTRAVENOUS
  Filled 2019-05-11: qty 50

## 2019-05-11 MED ORDER — SODIUM CHLORIDE 0.9 % IV SOLN
Freq: Once | INTRAVENOUS | Status: AC
  Administered 2019-05-11: 10:00:00 via INTRAVENOUS
  Filled 2019-05-11: qty 250

## 2019-05-11 MED ORDER — PEGFILGRASTIM 6 MG/0.6ML ~~LOC~~ PSKT
6.0000 mg | PREFILLED_SYRINGE | Freq: Once | SUBCUTANEOUS | Status: AC
  Administered 2019-05-11: 6 mg via SUBCUTANEOUS
  Filled 2019-05-11: qty 0.6

## 2019-05-11 NOTE — Progress Notes (Signed)
B/P 132/94, HR 113. Per Dr. Rogue Bussing okay to proceed with treatment.

## 2019-05-11 NOTE — Assessment & Plan Note (Addendum)
#  Recurrent metastatic breast cancer-ER PR positive HER-2 negative; June 3rd PET scan shows-up to 3.5 cm left lobe of liver uptake; no evidence of any disease uptake anywhere else. Currently on ixempera today.  May 06, 2019 MRI shows at least 5 new lesions [largest 10 mm]  # Proceed with cycle #3 today. Labs today reviewed;  acceptable for treatment today. Proceed with onpro. ANC 1.7 today. Will scan after 4 treatments.   #Elevated alkaline phosphatase-likely second malignancy. 137- STABLE.   # Brain metastases-recurrent/likely leptomeningeal disease;  S/p WBRT [April 2019].  May 06, 2019 MRI brain shows at least 5 new lesions-3 mm to 10 mm; asymptomatic.  Discussed regarding reirradiation/also discussed the potential side effects of reirradiation.  As Ixempra has blood-brain barrier penetration-as patient is asymptomatic I think is reasonable to continue systemic therapy.  We will plan to get a MRI brain in the next 6 weeks or so.   #Peripheral neuropathy grade 1-  on Lyrica stable.   #Discussed with the patient regarding palliative care/discussed palliative care philosophy.  Patient agrees to meet with Josh/next visit.  # DISPOSITION: # chemo today.  #3 weeks-MD/JOsh- pal care; labs-CBC CMP; ixempra/onpro;  Dr.B  # I reviewed the blood work- with the patient in detail; also reviewed the imaging independently [as summarized above]; and with the patient in detail.   # 40 minutes face-to-face with the patient discussing the above plan of care; more than 50% of time spent on prognosis/ natural history; counseling and coordination.

## 2019-05-11 NOTE — Progress Notes (Signed)
[facility] OFFICE PROGRESS NOTE  Patient Care Team: Lada, Satira Anis, MD as PCP - General (Family Medicine) Wellington Hampshire, MD as PCP - Cardiology (Cardiology) Byrnett, Forest Gleason, MD (General Surgery) [name], MD (Inactive) as Attending Physician (Internal Medicine)  Cancer Staging No matching staging information was found for the patient.   Oncology History Overview Note  # LEFT BREAST IDC; STAGE II [cT2N1] ER >90%; PR- 50-90%; her 2 NEU- POS; s/p Neoadj chemo; AUG 2016- TCH+P s/p Lumpec & partial ALND- path CR; s/p RT [finished Nov 2016];  adj Herceptin; HELD for Jan 19th 2017 [in DEC 2016-EF dropped from 63 to 51%; FEB 27th EF-42.9%]; JAN 2016 START Arimidex; May 2017- EF- 60%; May 24th 2017-Re-start Herceptin q 3W; July 28th STOP herceptin [finished 18m/m2; sec to Low EF; however 2017 Oct EF= 67%; improved]  # DEC 4th 2017- Start Neratinib 4 pills; DEC 11th 3 pills; STOPPED.  # FEB 2018- RECURRENCE ER/PR positive; Her 2 NEGATIVE [liver Bx]  # MARCH 1st 2018- Tax-Cytoxan [poor tol]  # FEB 2018- Brain mets [SBRT; Dr.Crystal; finished Feb 28th 2018]  # March 2018- Eribulin- poor tolerance  # June 8th 2018- Started Faslodex + Abema [abema-multiple interruptions sec to neutropenia]  # MRI Brain- March 2019- Leptomeningeal mets [WBRT; No LP s/p WBRT- 01/09/2018]; STOP Abema [580mday-sec to severe cytopenia]+faslodex  # MAY 20tth 019- Start Xeloda; STOPPED in Nov 22nd 2019- sec to Progressive liver lesions on CT scan  # NOV 26th 2019- Faslodex + Piqaray 25013m; Jan 2020- Piqray 300 mg+ faslodex; FEB 17th CT scan- mixed response/ Overall progression; STOP Piqray + faslodex.   # FEB 25th 2020- ERIBULIN; Poor tolerance sec to severe neutropenia.  Stopped May 2020.  CT scan May 2020-new/progressive 2 cm liver lesion;   #July nd 2020-Ixempra  #May 06, 2019-MRI brain 5 new lesions [3 mm to 10 mm]-asymptomatic; [previous whole brain radiation]-active  surveillance  # PN- G-2; may 2017- Cymbalta 25m6mMARCH 2018-  acute vascular insuff of BIL LE [? Taxotere vasoconstriction on asprin]; # hemorragic shock LGIB [s/p colo; Dr.Wohl]  #  SVT- on flecainide.   # April 2016- Liver Bx- NEG;   # Drop in EF from Herceptin [recovered OCt 27th- EF 65%]  # BMD- jan 2017- osteopenia  -----------------------------------------------------------     [place]- Sep 4th 2018- PDL-1 0%/NEG for BRCA; MSI-Stable; Positive for PI3K; ccnd-1; FGF** -----------------------------------------------------------  Dx: Metastatic Breast cancer [ER/PR-Pos; her -2 NEG] Stage IV; goals: palliative Current treatment-Ixempra   Carcinoma of upper-outer quadrant of left breast in female, estrogen receptor positive (HCC)[place]/25/2020 - 01/17/2019 Chemotherapy   The patient had eriBULin mesylate ([place]) 2.1 mg in sodium chloride 0.9 % 100 mL chemo infusion, 1.2 mg/m2 = 2.1 mg (100 % of original dose 1.2 mg/m2), Intravenous,  Once, 3 of 4 cycles Dose modification: 1.2 mg/m2 (original dose 1.2 mg/m2, Cycle 1, Reason: Provider Judgment), 0.8 mg/m2 (original dose 1.2 mg/m2, Cycle 3, Reason: Provider Judgment) Administration: 2.1 mg (11/22/2018), 2.1 mg (11/29/2018), 2.1 mg (12/20/2018), 1.4 mg (01/17/2019)  for chemotherapy treatment.    03/30/2019 -  Chemotherapy   The patient had pegfilgrastim (NEULASTA ONPRO KIT) injection 6 mg, 6 mg, Subcutaneous, Once, 3 of 6 cycles Administration: 6 mg (03/30/2019), 6 mg (04/20/2019), 6 mg (05/11/2019) ixabepilone (IXEMPRA) 60 mg in lactated ringers 250 mL chemo infusion, 33.5 mg/m2 = 57 mg (100 % of original dose 32 mg/m2), Intravenous,  Once, 3 of 6 cycles Dose modification: 32 mg/m2 (  original dose 32 mg/m2, Cycle 1, Reason: Provider Judgment) Administration: 72 mg (04/20/2019), 72 mg (05/11/2019)  for chemotherapy treatment.      INTERVAL HISTORY:  Sarah Horne 63 y.o.  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  Patient received cycle #2 of Ixempra 3 weeks ago.   Patient denies any headaches; denies any nausea vomiting.  Appetite is fair.  No weight loss.  Chronic mild tingling and numbness next minutes.  No falls.   Review of Systems  Constitutional: Positive for malaise/fatigue. Negative for chills, diaphoresis, fever and weight loss.  HENT: Negative for nosebleeds and sore throat.   Eyes: Negative for double vision.  Respiratory: Negative for cough, hemoptysis, sputum production, shortness of breath and wheezing.   Cardiovascular: Negative for chest pain, palpitations, orthopnea and leg swelling.  Gastrointestinal: Negative for abdominal pain, blood in stool, constipation, heartburn, melena, nausea and vomiting.  Genitourinary: Negative for dysuria, frequency and urgency.  Musculoskeletal: Positive for joint pain. Negative for back pain.  Skin: Negative for itching.  Neurological: Positive for tingling. Negative for dizziness, focal weakness, weakness and headaches.  Endo/Heme/Allergies: Does not bruise/bleed easily.  Psychiatric/Behavioral: Negative for depression. The patient is not nervous/anxious and does not have insomnia.       PAST MEDICAL HISTORY :  Past Medical History:  Diagnosis Date  . Chemotherapy-induced peripheral neuropathy ([place])   . Epistaxis    a. 11/2016 in setting of asa/plavix-->silver nitrate cauterization.  . GI bleed    a. 11/2016 Admission w/ GIB and hypovolemic shock req 3u PRBC's;  b. 11/2016 ECG: gastritis & nonbleeding peptic ulcer; c. 11/2016 Conlonoscopy: rectal and sigmoid colonic ulcers.  . Heart attack ([place])    a. 1998 Cath @ [facility]: reportedly no intervention required.  Marland Kitchen Herceptin-induced cardiomyopathy ([place])    a. In the setting of Herceptin Rx for breast cancer  (initiated 12/2014); b. 03/2015 MUGA EF 64%; b. 08/2015 MUGA: EF 51%; c. 10/2015 MUGA: EF 44%; d. 11/2015 Echo: EF 45-50%; e. 01/2016 MUGA: EF 60%; f. 06/2016 MUGA EF 65%; g. 10/2016 MUGA: EF 61%;  h. 12/2016 Echo: EF 55-60%, gr1 DD.  Marland Kitchen Hyperlipidemia   . Hypertension   . Neuropathy   . Possible PAD (peripheral artery disease) ([place])    a. 11/2016 LE cyanosis and weak pulses-->CTA w/o significant Ao-BiFem dzs. ? distal dzs-->ASA/Plavix initiated by vascular surgery.  Marland Kitchen PSVT (paroxysmal supraventricular tachycardia) ([place])    a. Dx 11/2016.  . Pulmonary embolism ([place])    a. 12/2016 CTA Chest: small nonocclusive PE in inferior segment of the Left lingula, somewhat eccentric filling defect suggesting chronic rather than acute embolic event; b. 05/3817 LE U/S:  No DVT; c. 12/2016 Echo: Nl RV fxn, nl PASP.  Marland Kitchen Recurrent Metastatic breast cancer ([place])    a. Dx 2016: Stage II, ER positive, PR positive, HER-2/neu overexpressing of the left breast-->chemo/radiation; b. 10/2016 CT Abd/pelvis: diffuse liver mets, ill defined sclerotic bone lesions-T12;  c. 10/2016 MRI brain: metastatic lesion along L temporal lobe (19x108m) w/ extensive surrounding edema & 576mmidline shift to right.  . Sinus tachycardia     PAST SURGICAL HISTORY :   Past Surgical History:  Procedure Laterality Date  . BREAST BIOPSY Left 2016   Positive  . BREAST LUMPECTOMY WITH SENTINEL LYMPH NODE BIOPSY Left 05/23/2015   Procedure: LEFT BREAST WIDE EXCISION WITH  AXILLARY DISSECTION, MASTOPLASTY ;  Surgeon: [name], MD;  Location: [facility] ORS;  Service: General;  Laterality: Left;  . BREAST SURGERY Left 12/18/14   breast biopsy/INVASIVE DUCTAL CARCINOMA OF BREAST, NOTTINGHAM GRADE 2.  Marland Kitchen BREAST SURGERY  05/23/2015.   Wide excision/mastoplasty, axillary dissection. No residual invasive cancer, positive for residual DCIS. 0/2 nodes identified on axillary dissection. (no SLN by technetium or methylene blue)  . CARDIAC CATHETERIZATION    .  COLONOSCOPY WITH PROPOFOL N/A 12/10/2016   Procedure: COLONOSCOPY WITH PROPOFOL;  Surgeon: Lucilla Lame, MD;  Location: [facility] ENDOSCOPY;  Service: Endoscopy;  Laterality: N/A;  . COLONOSCOPY WITH PROPOFOL N/A 02/13/2017   Procedure: COLONOSCOPY WITH PROPOFOL;  Surgeon: Lucilla Lame, MD;  Location: [facility] ENDOSCOPY;  Service: Endoscopy;  Laterality: N/A;  . ESOPHAGOGASTRODUODENOSCOPY (EGD) WITH PROPOFOL N/A 12/08/2016   Procedure: ESOPHAGOGASTRODUODENOSCOPY (EGD) WITH PROPOFOL;  Surgeon: Lucilla Lame, MD;  Location: [facility] ENDOSCOPY;  Service: Endoscopy;  Laterality: N/A;  . IVC FILTER INSERTION N/A 02/15/2017   Procedure: IVC Filter Insertion;  Surgeon: Algernon Huxley, MD;  Location: [facility];  Service: Cardiovascular;  Laterality: N/A;  . PORTACATH PLACEMENT Right 12-31-14   Dr Bary Castilla    FAMILY HISTORY :   Family History  Problem Relation Age of Onset  . Breast cancer Maternal Aunt   . Breast cancer Cousin   . Brain cancer Maternal Uncle   . Diabetes Mother   . Hypertension Mother   . Stroke Mother     SOCIAL HISTORY:   Social History   Tobacco Use  . Smoking status: Light Tobacco Smoker    Packs/day: 0.50    Years: 18.00    Pack years: 9.00    Types: Cigarettes  . Smokeless tobacco: Never Used  Substance Use Topics  . Alcohol use: No    Alcohol/week: 0.0 standard drinks  . Drug use: No    ALLERGIES:  is allergic to no known allergies.  MEDICATIONS:  Current Outpatient Medications  Medication Sig Dispense Refill  . flecainide (TAMBOCOR) 50 MG tablet Take 1 tablet (50 mg total) by mouth 2 (two) times daily. 180 tablet 3  . magnesium oxide (MAG-OX) 400 MG tablet TAKE 1 TABLET BY MOUTH TWICE A DAY 60 tablet 6  . pregabalin (LYRICA) 75 MG capsule Take 1 capsule (75 mg total) by mouth 2 (two) times daily. 180 capsule 0  . triamcinolone ointment (KENALOG) 0.5 % Apply 1 application topically 2 (two) times daily. 30 g 2  . Vitamin D, Ergocalciferol, (DRISDOL) 1.25 MG (50000 UT)  CAPS capsule TAKE 1 CAPSULE (50,000 UNITS TOTAL) ONCE A WEEK BY MOUTH. 12 capsule 1   No current facility-administered medications for this visit.    Facility-Administered Medications Ordered in Other Visits  Medication Dose Route Frequency Provider Last Rate Last Dose  . 0.9 %  sodium chloride infusion   Intravenous Continuous Pandit, Sandeep, MD      . 0.9 %  sodium chloride infusion   Intravenous Continuous Lloyd Huger, MD 999 mL/hr at 04/24/15 1520    . heparin lock flush 100 unit/mL  500 Units Intravenous Once Berenzon, Dmitriy, MD      . sodium chloride 0.9 % injection 10 mL  10 mL Intravenous PRN [name], MD   10 mL at 04/04/15 1440  . sodium chloride flush (NS) 0.9 % injection 10 mL  10 mL Intravenous PRN Berenzon, Dmitriy, MD        PHYSICAL EXAMINATION: ECOG PERFORMANCE STATUS: 1 - Symptomatic  but completely ambulatory  BP (!) 132/94 (BP Location: Left Arm, Patient Position: Sitting)   Pulse (!) 113   Temp (!) 96.5 F (35.8 C) (Tympanic)   Resp 16   Wt 154 lb 12.8 oz (70.2 kg)   BMI 24.99 kg/m   Filed Weights   05/11/19 0837  Weight: 154 lb 12.8 oz (70.2 kg)    Physical Exam  Constitutional: She is oriented to person, place, and time and well-developed, well-nourished, and in no distress.  Alone.  Walking herself.  HENT:  Head: Normocephalic and atraumatic.  Mouth/Throat: Oropharynx is clear and moist. No oropharyngeal exudate.  Eyes: Pupils are equal, round, and reactive to light.  Neck: Normal range of motion. Neck supple.  Cardiovascular: Normal rate and regular rhythm.  Pulmonary/Chest: No respiratory distress. She has no wheezes.  Abdominal: Soft. Bowel sounds are normal. She exhibits no distension and no mass. There is no abdominal tenderness. There is no rebound and no guarding.  Musculoskeletal: Normal range of motion.        General: No tenderness or edema.  Neurological: She is alert and oriented to person, place, and time.  Skin: Skin  is warm.  Psychiatric: Affect normal.    LABORATORY DATA:  I have reviewed the data as listed    Component Value Date/Time   NA 140 05/11/2019 0811   NA 135 03/04/2017 0918   NA 135 01/08/2015 0850   K 3.7 05/11/2019 0811   K 3.7 01/08/2015 0850   CL 106 05/11/2019 0811   CL 101 01/08/2015 0850   CO2 26 05/11/2019 0811   CO2 26 01/08/2015 0850   GLUCOSE 89 05/11/2019 0811   GLUCOSE 161 (H) 01/08/2015 0850   BUN 15 05/11/2019 0811   BUN 8 03/04/2017 0918   BUN 17 01/08/2015 0850   CREATININE 0.78 05/11/2019 0811   CREATININE 0.82 01/22/2015 1559   CALCIUM 8.9 05/11/2019 0811   CALCIUM 9.3 01/08/2015 0850   PROT 7.1 05/11/2019 0811   PROT 6.4 03/04/2017 0918   PROT 6.8 01/22/2015 1559   ALBUMIN 3.9 05/11/2019 0811   ALBUMIN 2.5 (L) 03/04/2017 0918   ALBUMIN 3.9 01/22/2015 1559   AST 36 05/11/2019 0811   AST 34 01/22/2015 1559   ALT 34 05/11/2019 0811   ALT 42 01/22/2015 1559   ALKPHOS 143 (H) 05/11/2019 0811   ALKPHOS 157 (H) 01/22/2015 1559   BILITOT 1.1 05/11/2019 0811   BILITOT 6.2 (H) 03/04/2017 0918   BILITOT 0.2 (L) 01/22/2015 1559   GFRNONAA >60 05/11/2019 0811   GFRNONAA >60 01/22/2015 1559   GFRAA >60 05/11/2019 0811   GFRAA >60 01/22/2015 1559    No results found for: SPEP, UPEP  Lab Results  Component Value Date   WBC 3.0 (L) 05/11/2019   NEUTROABS 1.7 05/11/2019   HGB 13.5 05/11/2019   HCT 40.7 05/11/2019   MCV 87.7 05/11/2019   PLT 348 05/11/2019      Chemistry      Component Value Date/Time   NA 140 05/11/2019 0811   NA 135 03/04/2017 0918   NA 135 01/08/2015 0850   K 3.7 05/11/2019 0811   K 3.7 01/08/2015 0850   CL 106 05/11/2019 0811   CL 101 01/08/2015 0850   CO2 26 05/11/2019 0811   CO2 26 01/08/2015 0850   BUN 15 05/11/2019 0811   BUN 8 03/04/2017 0918   BUN 17 01/08/2015 0850   CREATININE 0.78 05/11/2019 0811   CREATININE 0.82 01/22/2015 1559  Component Value Date/Time   CALCIUM 8.9 05/11/2019 0811   CALCIUM 9.3  01/08/2015 0850   ALKPHOS 143 (H) 05/11/2019 0811   ALKPHOS 157 (H) 01/22/2015 1559   AST 36 05/11/2019 0811   AST 34 01/22/2015 1559   ALT 34 05/11/2019 0811   ALT 42 01/22/2015 1559   BILITOT 1.1 05/11/2019 0811   BILITOT 6.2 (H) 03/04/2017 0918   BILITOT 0.2 (L) 01/22/2015 1559       RADIOGRAPHIC STUDIES: I have personally reviewed the radiological images as listed and agreed with the findings in the report. No results found.   ASSESSMENT & PLAN:  Carcinoma of upper-outer quadrant of left breast in female, estrogen receptor positive ([place]) # Recurrent metastatic breast cancer-ER PR positive HER-2 negative; June 3rd PET scan shows-up to 3.5 cm left lobe of liver uptake; no evidence of any disease uptake anywhere else. Currently on ixempera today.  May 06, 2019 MRI shows at least 5 new lesions [largest 10 mm]  # Proceed with cycle #3 today. Labs today reviewed;  acceptable for treatment today. Proceed with onpro. ANC 1.7 today. Will scan after 4 treatments.   #Elevated alkaline phosphatase-likely second malignancy. 137- STABLE.   # Brain metastases-recurrent/likely leptomeningeal disease;  S/p WBRT [April 2019].  May 06, 2019 MRI brain shows at least 5 new lesions-3 mm to 10 mm; asymptomatic.  Discussed regarding reirradiation/also discussed the potential side effects of reirradiation.  As Ixempra has blood-brain barrier penetration-as patient is asymptomatic I think is reasonable to continue systemic therapy.  We will plan to get a MRI brain in the next 6 weeks or so.   #Peripheral neuropathy grade 1-  on Lyrica stable.   #Discussed with the patient regarding palliative care/discussed palliative care philosophy.  Patient agrees to meet with Josh/next visit.  # DISPOSITION: # chemo today.  #3 weeks-MD/JOsh- pal care; labs-CBC CMP; ixempra/onpro;  Dr.B  # I reviewed the blood work- with the patient in detail; also reviewed the imaging independently [as summarized above]; and  with the patient in detail.   # 40 minutes face-to-face with the patient discussing the above plan of care; more than 50% of time spent on prognosis/ natural history; counseling and coordination.       No orders of the defined types were placed in this encounter.  All questions were answered. The patient knows to call the clinic with any problems, questions or concerns.      Cammie Sickle, MD 05/15/2019 7:22 AM

## 2019-05-31 NOTE — Progress Notes (Signed)
Patient is coming in for follow up, she is doing well no complaints. She is aware of the no visitor policy and has been screened and answered all no.

## 2019-06-01 ENCOUNTER — Other Ambulatory Visit: Payer: Self-pay

## 2019-06-01 ENCOUNTER — Inpatient Hospital Stay (HOSPITAL_BASED_OUTPATIENT_CLINIC_OR_DEPARTMENT_OTHER): Payer: Medicare HMO | Admitting: Hospice and Palliative Medicine

## 2019-06-01 ENCOUNTER — Inpatient Hospital Stay (HOSPITAL_BASED_OUTPATIENT_CLINIC_OR_DEPARTMENT_OTHER): Payer: Medicare HMO | Admitting: Internal Medicine

## 2019-06-01 ENCOUNTER — Inpatient Hospital Stay: Payer: Medicare HMO | Attending: Internal Medicine

## 2019-06-01 ENCOUNTER — Inpatient Hospital Stay: Payer: Medicare HMO

## 2019-06-01 DIAGNOSIS — Z86711 Personal history of pulmonary embolism: Secondary | ICD-10-CM | POA: Insufficient documentation

## 2019-06-01 DIAGNOSIS — Z7189 Other specified counseling: Secondary | ICD-10-CM

## 2019-06-01 DIAGNOSIS — C7931 Secondary malignant neoplasm of brain: Secondary | ICD-10-CM | POA: Diagnosis not present

## 2019-06-01 DIAGNOSIS — Z170 Estrogen receptor positive status [ER+]: Secondary | ICD-10-CM | POA: Insufficient documentation

## 2019-06-01 DIAGNOSIS — R748 Abnormal levels of other serum enzymes: Secondary | ICD-10-CM | POA: Diagnosis not present

## 2019-06-01 DIAGNOSIS — T451X5A Adverse effect of antineoplastic and immunosuppressive drugs, initial encounter: Secondary | ICD-10-CM | POA: Diagnosis not present

## 2019-06-01 DIAGNOSIS — C50412 Malignant neoplasm of upper-outer quadrant of left female breast: Secondary | ICD-10-CM

## 2019-06-01 DIAGNOSIS — Z5111 Encounter for antineoplastic chemotherapy: Secondary | ICD-10-CM | POA: Diagnosis not present

## 2019-06-01 DIAGNOSIS — F17210 Nicotine dependence, cigarettes, uncomplicated: Secondary | ICD-10-CM | POA: Diagnosis not present

## 2019-06-01 DIAGNOSIS — G620 Drug-induced polyneuropathy: Secondary | ICD-10-CM | POA: Diagnosis not present

## 2019-06-01 DIAGNOSIS — Z803 Family history of malignant neoplasm of breast: Secondary | ICD-10-CM | POA: Insufficient documentation

## 2019-06-01 DIAGNOSIS — I10 Essential (primary) hypertension: Secondary | ICD-10-CM | POA: Insufficient documentation

## 2019-06-01 DIAGNOSIS — Z515 Encounter for palliative care: Secondary | ICD-10-CM | POA: Insufficient documentation

## 2019-06-01 DIAGNOSIS — Z95828 Presence of other vascular implants and grafts: Secondary | ICD-10-CM

## 2019-06-01 DIAGNOSIS — Z79899 Other long term (current) drug therapy: Secondary | ICD-10-CM | POA: Diagnosis not present

## 2019-06-01 DIAGNOSIS — C787 Secondary malignant neoplasm of liver and intrahepatic bile duct: Secondary | ICD-10-CM | POA: Diagnosis not present

## 2019-06-01 DIAGNOSIS — Z7689 Persons encountering health services in other specified circumstances: Secondary | ICD-10-CM | POA: Diagnosis not present

## 2019-06-01 DIAGNOSIS — R5383 Other fatigue: Secondary | ICD-10-CM | POA: Insufficient documentation

## 2019-06-01 DIAGNOSIS — M722 Plantar fascial fibromatosis: Secondary | ICD-10-CM | POA: Insufficient documentation

## 2019-06-01 DIAGNOSIS — R5381 Other malaise: Secondary | ICD-10-CM | POA: Insufficient documentation

## 2019-06-01 DIAGNOSIS — E785 Hyperlipidemia, unspecified: Secondary | ICD-10-CM | POA: Diagnosis not present

## 2019-06-01 DIAGNOSIS — M8580 Other specified disorders of bone density and structure, unspecified site: Secondary | ICD-10-CM | POA: Diagnosis not present

## 2019-06-01 LAB — CBC WITH DIFFERENTIAL/PLATELET
Abs Immature Granulocytes: 0.02 10*3/uL (ref 0.00–0.07)
Basophils Absolute: 0 10*3/uL (ref 0.0–0.1)
Basophils Relative: 1 %
Eosinophils Absolute: 0.1 10*3/uL (ref 0.0–0.5)
Eosinophils Relative: 3 %
HCT: 36.5 % (ref 36.0–46.0)
Hemoglobin: 11.9 g/dL — ABNORMAL LOW (ref 12.0–15.0)
Immature Granulocytes: 1 %
Lymphocytes Relative: 24 %
Lymphs Abs: 0.8 10*3/uL (ref 0.7–4.0)
MCH: 29.9 pg (ref 26.0–34.0)
MCHC: 32.6 g/dL (ref 30.0–36.0)
MCV: 91.7 fL (ref 80.0–100.0)
Monocytes Absolute: 0.4 10*3/uL (ref 0.1–1.0)
Monocytes Relative: 12 %
Neutro Abs: 1.9 10*3/uL (ref 1.7–7.7)
Neutrophils Relative %: 59 %
Platelets: 226 10*3/uL (ref 150–400)
RBC: 3.98 MIL/uL (ref 3.87–5.11)
RDW: 19.5 % — ABNORMAL HIGH (ref 11.5–15.5)
WBC: 3.2 10*3/uL — ABNORMAL LOW (ref 4.0–10.5)
nRBC: 0 % (ref 0.0–0.2)

## 2019-06-01 LAB — COMPREHENSIVE METABOLIC PANEL
ALT: 29 U/L (ref 0–44)
AST: 27 U/L (ref 15–41)
Albumin: 4 g/dL (ref 3.5–5.0)
Alkaline Phosphatase: 124 U/L (ref 38–126)
Anion gap: 7 (ref 5–15)
BUN: 18 mg/dL (ref 8–23)
CO2: 27 mmol/L (ref 22–32)
Calcium: 8.8 mg/dL — ABNORMAL LOW (ref 8.9–10.3)
Chloride: 105 mmol/L (ref 98–111)
Creatinine, Ser: 0.73 mg/dL (ref 0.44–1.00)
GFR calc Af Amer: >60 mL/min (ref >60)
GFR calc non Af Amer: >60 mL/min (ref >60)
Glucose, Bld: 95 mg/dL (ref 70–99)
Potassium: 3.8 mmol/L (ref 3.5–5.1)
Sodium: 139 mmol/L (ref 135–145)
Total Bilirubin: 1.2 mg/dL (ref 0.3–1.2)
Total Protein: 7 g/dL (ref 6.5–8.1)

## 2019-06-01 MED ORDER — SODIUM CHLORIDE 0.9 % IV SOLN
20.0000 mg | Freq: Once | INTRAVENOUS | Status: AC
  Administered 2019-06-01: 20 mg via INTRAVENOUS
  Filled 2019-06-01: qty 2

## 2019-06-01 MED ORDER — SODIUM CHLORIDE 0.9 % IV SOLN
Freq: Once | INTRAVENOUS | Status: AC
  Administered 2019-06-01: 10:00:00 via INTRAVENOUS
  Filled 2019-06-01: qty 250

## 2019-06-01 MED ORDER — LACTATED RINGERS IV SOLN
40.0000 mg/m2 | Freq: Once | INTRAVENOUS | Status: AC
  Administered 2019-06-01: 72 mg via INTRAVENOUS
  Filled 2019-06-01: qty 22.5

## 2019-06-01 MED ORDER — HEPARIN SOD (PORK) LOCK FLUSH 100 UNIT/ML IV SOLN
500.0000 [IU] | Freq: Once | INTRAVENOUS | Status: AC
  Administered 2019-06-01: 500 [IU] via INTRAVENOUS
  Filled 2019-06-01: qty 5

## 2019-06-01 MED ORDER — FAMOTIDINE IN NACL 20-0.9 MG/50ML-% IV SOLN
20.0000 mg | Freq: Once | INTRAVENOUS | Status: AC
  Administered 2019-06-01: 20 mg via INTRAVENOUS
  Filled 2019-06-01: qty 50

## 2019-06-01 MED ORDER — PEGFILGRASTIM 6 MG/0.6ML ~~LOC~~ PSKT
6.0000 mg | PREFILLED_SYRINGE | Freq: Once | SUBCUTANEOUS | Status: AC
  Administered 2019-06-01: 6 mg via SUBCUTANEOUS
  Filled 2019-06-01: qty 0.6

## 2019-06-01 MED ORDER — SODIUM CHLORIDE 0.9% FLUSH
10.0000 mL | Freq: Once | INTRAVENOUS | Status: AC
  Administered 2019-06-01: 10 mL via INTRAVENOUS
  Filled 2019-06-01: qty 10

## 2019-06-01 MED ORDER — DIPHENHYDRAMINE HCL 50 MG/ML IJ SOLN
50.0000 mg | Freq: Once | INTRAMUSCULAR | Status: AC
  Administered 2019-06-01: 10:00:00 50 mg via INTRAVENOUS
  Filled 2019-06-01: qty 1

## 2019-06-01 NOTE — Progress Notes (Signed)
[facility] OFFICE PROGRESS NOTE  Patient Care Team: Lada, Satira Anis, MD as PCP - General (Family Medicine) Wellington Hampshire, MD as PCP - Cardiology (Cardiology) Byrnett, Forest Gleason, MD (General Surgery) [name], MD (Inactive) as Attending Physician (Internal Medicine)  Cancer Staging No matching staging information was found for the patient.   Oncology History Overview Note  # LEFT BREAST IDC; STAGE II [cT2N1] ER >90%; PR- 50-90%; her 2 NEU- POS; s/p Neoadj chemo; AUG 2016- TCH+P s/p Lumpec & partial ALND- path CR; s/p RT [finished Nov 2016];  adj Herceptin; HELD for Jan 19th 2017 [in DEC 2016-EF dropped from 63 to 51%; FEB 27th EF-42.9%]; JAN 2016 START Arimidex; May 2017- EF- 60%; May 24th 2017-Re-start Herceptin q 3W; July 28th STOP herceptin [finished 65m/m2; sec to Low EF; however 2017 Oct EF= 67%; improved]  # DEC 4th 2017- Start Neratinib 4 pills; DEC 11th 3 pills; STOPPED.  # FEB 2018- RECURRENCE ER/PR positive; Her 2 NEGATIVE [liver Bx]  # MARCH 1st 2018- Tax-Cytoxan [poor tol]  # FEB 2018- Brain mets [SBRT; Dr.Crystal; finished Feb 28th 2018]  # March 2018- Eribulin- poor tolerance  # June 8th 2018- Started Faslodex + Abema [abema-multiple interruptions sec to neutropenia]  # MRI Brain- March 2019- Leptomeningeal mets [WBRT; No LP s/p WBRT- 01/09/2018]; STOP Abema [518mday-sec to severe cytopenia]+faslodex  # MAY 20tth 019- Start Xeloda; STOPPED in Nov 22nd 2019- sec to Progressive liver lesions on CT scan  # NOV 26th 2019- Faslodex + Piqaray 25017m; Jan 2020- Piqray 300 mg+ faslodex; FEB 17th CT scan- mixed response/ Overall progression; STOP Piqray + faslodex.   # FEB 25th 2020- ERIBULIN; Poor tolerance sec to severe neutropenia.  Stopped May 2020.  CT scan May 2020-new/progressive 2 cm liver lesion;   #July nd 2020-Ixempra  #May 06, 2019-MRI brain 5 new lesions [3 mm to 10 mm]-asymptomatic; [previous whole brain radiation]-active  surveillance  # PN- G-2; may 2017- Cymbalta 52m76mMARCH 2018-  acute vascular insuff of BIL LE [? Taxotere vasoconstriction on asprin]; # hemorragic shock LGIB [s/p colo; Dr.Wohl]  #  SVT- on flecainide.   # April 2016- Liver Bx- NEG;   # Drop in EF from Herceptin [recovered OCt 27th- EF 65%]  # BMD- jan 2017- osteopenia  -----------------------------------------------------------     [place]- Sep 4th 2018- PDL-1 0%/NEG for BRCA; MSI-Stable; Positive for PI3K; ccnd-1; FGF** -----------------------------------------------------------  Dx: Metastatic Breast cancer [ER/PR-Pos; her -2 NEG] Stage IV; goals: palliative Current treatment-Ixempra   Carcinoma of upper-outer quadrant of left breast in female, estrogen receptor positive (HCC)[place]/25/2020 - 01/17/2019 Chemotherapy   The patient had eriBULin mesylate (HAL[place]EN) 2.1 mg in sodium chloride 0.9 % 100 mL chemo infusion, 1.2 mg/m2 = 2.1 mg (100 % of original dose 1.2 mg/m2), Intravenous,  Once, 3 of 4 cycles Dose modification: 1.2 mg/m2 (original dose 1.2 mg/m2, Cycle 1, Reason: Provider Judgment), 0.8 mg/m2 (original dose 1.2 mg/m2, Cycle 3, Reason: Provider Judgment) Administration: 2.1 mg (11/22/2018), 2.1 mg (11/29/2018), 2.1 mg (12/20/2018), 1.4 mg (01/17/2019)  for chemotherapy treatment.    03/30/2019 -  Chemotherapy   The patient had pegfilgrastim (NEULASTA ONPRO KIT) injection 6 mg, 6 mg, Subcutaneous, Once, 4 of 6 cycles Administration: 6 mg (03/30/2019), 6 mg (04/20/2019), 6 mg (05/11/2019) ixabepilone (IXEMPRA) 60 mg in lactated ringers 250 mL chemo infusion, 33.5 mg/m2 = 57 mg (100 % of original dose 32 mg/m2), Intravenous,  Once, 4 of 6 cycles Dose modification: 32 mg/m2 (  original dose 32 mg/m2, Cycle 1, Reason: Provider Judgment) Administration: 72 mg (04/20/2019), 72 mg (05/11/2019)  for chemotherapy treatment.      INTERVAL HISTORY:  Sarah Horne 63 y.o.  female pleasant patient above history of  metastatic breast cancer ER PR positive/leptomeningeal disease metastatic to brain-status post cycle #3 of Ixempra 3 weeks ago.   No headaches.  No nausea no vomiting.  Gaining weight. Chronic mild tingling and numbness. No falls.   Review of Systems  Constitutional: Positive for malaise/fatigue. Negative for chills, diaphoresis, fever and weight loss.  HENT: Negative for nosebleeds and sore throat.   Eyes: Negative for double vision.  Respiratory: Negative for cough, hemoptysis, sputum production, shortness of breath and wheezing.   Cardiovascular: Negative for chest pain, palpitations, orthopnea and leg swelling.  Gastrointestinal: Negative for abdominal pain, blood in stool, constipation, heartburn, melena, nausea and vomiting.  Genitourinary: Negative for dysuria, frequency and urgency.  Musculoskeletal: Positive for joint pain. Negative for back pain.  Skin: Negative for itching.  Neurological: Positive for tingling. Negative for dizziness, focal weakness, weakness and headaches.  Endo/Heme/Allergies: Does not bruise/bleed easily.  Psychiatric/Behavioral: Negative for depression. The patient is not nervous/anxious and does not have insomnia.       PAST MEDICAL HISTORY :  Past Medical History:  Diagnosis Date  . Chemotherapy-induced peripheral neuropathy ([place])   . Epistaxis    a. 11/2016 in setting of asa/plavix-->silver nitrate cauterization.  . GI bleed    a. 11/2016 Admission w/ GIB and hypovolemic shock req 3u PRBC's;  b. 11/2016 ECG: gastritis & nonbleeding peptic ulcer; c. 11/2016 Conlonoscopy: rectal and sigmoid colonic ulcers.  . Heart attack ([place])    a. 1998 Cath @ [facility]: reportedly no intervention required.  Marland Kitchen Herceptin-induced cardiomyopathy ([place])    a. In the setting of Herceptin Rx for breast cancer (initiated 12/2014); b. 03/2015 MUGA EF 64%; b. 08/2015 MUGA: EF 51%; c. 10/2015 MUGA: EF 44%; d. 11/2015 Echo: EF 45-50%; e. 01/2016 MUGA: EF 60%; f. 06/2016 MUGA EF 65%; g. 10/2016  MUGA: EF 61%;  h. 12/2016 Echo: EF 55-60%, gr1 DD.  Marland Kitchen Hyperlipidemia   . Hypertension   . Neuropathy   . Possible PAD (peripheral artery disease) ([place])    a. 11/2016 LE cyanosis and weak pulses-->CTA w/o significant Ao-BiFem dzs. ? distal dzs-->ASA/Plavix initiated by vascular surgery.  Marland Kitchen PSVT (paroxysmal supraventricular tachycardia) ([place])    a. Dx 11/2016.  . Pulmonary embolism ([place])    a. 12/2016 CTA Chest: small nonocclusive PE in inferior segment of the Left lingula, somewhat eccentric filling defect suggesting chronic rather than acute embolic event; b. 09/6943 LE U/S:  No DVT; c. 12/2016 Echo: Nl RV fxn, nl PASP.  Marland Kitchen Recurrent Metastatic breast cancer ([place])    a. Dx 2016: Stage II, ER positive, PR positive, HER-2/neu overexpressing of the left breast-->chemo/radiation; b. 10/2016 CT Abd/pelvis: diffuse liver mets, ill defined sclerotic bone lesions-T12;  c. 10/2016 MRI brain: metastatic lesion along L temporal lobe (19x8m) w/ extensive surrounding edema & 583mmidline shift to right.  . Sinus tachycardia     PAST SURGICAL HISTORY :   Past Surgical History:  Procedure Laterality Date  . BREAST BIOPSY Left 2016   Positive  . BREAST LUMPECTOMY WITH SENTINEL LYMPH NODE BIOPSY Left 05/23/2015   Procedure: LEFT BREAST WIDE EXCISION WITH AXILLARY DISSECTION, MASTOPLASTY ;  Surgeon: JeRobert BellowMD;  Location: [facility] ORS;  Service: General;  Laterality: Left;  . BREAST SURGERY Left 12/18/14  breast biopsy/INVASIVE DUCTAL CARCINOMA OF BREAST, NOTTINGHAM GRADE 2.  Marland Kitchen BREAST SURGERY  05/23/2015.   Wide excision/mastoplasty, axillary dissection. No residual invasive cancer, positive for residual DCIS. 0/2 nodes identified on axillary dissection. (no SLN by technetium or methylene blue)  . CARDIAC CATHETERIZATION    . COLONOSCOPY WITH PROPOFOL N/A 12/10/2016   Procedure: COLONOSCOPY WITH PROPOFOL;  Surgeon: Lucilla Lame, MD;  Location: [facility] ENDOSCOPY;  Service: Endoscopy;  Laterality: N/A;  .  COLONOSCOPY WITH PROPOFOL N/A 02/13/2017   Procedure: COLONOSCOPY WITH PROPOFOL;  Surgeon: Lucilla Lame, MD;  Location[facility] END[facility]COPY;  Service: Endoscopy;  Laterality: N/A;  . ESOPHAGOGASTRODUODENOSCOPY (EGD) WITH PROPOFOL N/A 12/08/2016   Procedure: ESOPHAGOGASTRODUODENOSCOPY (EGD) WITH PROPOFOL;  Surgeon: Lucilla Lame, MD;  Location: [facility] ENDOSCOPY;  Service: Endoscopy;  Laterality: N/A;  . IVC FILTER INSERTION N/A 02/15/2017   Procedure: IVC Filter Insertion;  Surgeon: Algernon Huxley, MD;  Location: [facility] CV LAB;  Service: Cardiovascular;  Laterality: N/A;  . PORTACATH PLACEMENT Right 12-31-14   Dr Bary Castilla    FAMILY HISTORY :   Family History  Problem Relation Age of Onset  . Breast cancer Maternal Aunt   . Breast cancer Cousin   . Brain cancer Maternal Uncle   . Diabetes Mother   . Hypertension Mother   . Stroke Mother     SOCIAL HISTORY:   Social History   Tobacco Use  . Smoking status: Light Tobacco Smoker    Packs/day: 0.50    Years: 18.00    Pack years: 9.00    Types: Cigarettes  . Smokeless tobacco: Never Used  Substance Use Topics  . Alcohol use: No    Alcohol/week: 0.0 standard drinks  . Drug use: No    ALLERGIES:  is allergic to no known allergies.  MEDICATIONS:  Current Outpatient Medications  Medication Sig Dispense Refill  . flecainide (TAMBOCOR) 50 MG tablet Take 1 tablet (50 mg total) by mouth 2 (two) times daily. 180 tablet 3  . magnesium oxide (MAG-OX) 400 MG tablet TAKE 1 TABLET BY MOUTH TWICE A DAY 60 tablet 6  . pregabalin (LYRICA) 75 MG capsule Take 1 capsule (75 mg total) by mouth 2 (two) times daily. 180 capsule 0  . triamcinolone ointment (KENALOG) 0.5 % Apply 1 application topically 2 (two) times daily. 30 g 2  . Vitamin D, Ergocalciferol, (DRISDOL) 1.25 MG (50000 UT) CAPS capsule TAKE 1 CAPSULE (50,000 UNITS TOTAL) ONCE A WEEK BY MOUTH. 12 capsule 1   No current facility-administered medications for this visit.    Facility-Administered  Medications Ordered in Other Visits  Medication Dose Route Frequency Provider Last Rate Last Dose  . 0.9 %  sodium chloride infusion   Intravenous Continuous Pandit, Sandeep, MD      . 0.9 %  sodium chloride infusion   Intravenous Continuous Lloyd Huger, MD 999 mL/hr at 04/24/15 1520    . heparin lock flush 100 unit/mL  500 Units Intravenous Once Berenzon, Dmitriy, MD      . heparin lock flush 100 unit/mL  500 Units Intravenous Once Charlaine Dalton R, MD      . ixabepilone (IXEMPRA) 72 mg in lactated ringers 250 mL chemo infusion  40 mg/m2 (Treatment Plan Recorded) Intravenous Once Cammie Sickle, MD 95 mL/hr at 06/01/19 1113 72 mg at 06/01/19 1113  . pegfilgrastim (NEULASTA ONPRO KIT) injection 6 mg  6 mg Subcutaneous Once Charlaine Dalton R, MD      . sodium chloride 0.9 % injection 10 mL  10 mL Intravenous PRN [name], MD   10 mL at 04/04/15 1440  . sodium chloride flush (NS) 0.9 % injection 10 mL  10 mL Intravenous PRN Berenzon, Dmitriy, MD        PHYSICAL EXAMINATION: ECOG PERFORMANCE STATUS: 1 - Symptomatic but completely ambulatory  BP (!) 149/97 (BP Location: Left Arm, Patient Position: Sitting, Cuff Size: Normal)   Pulse (!) 105   Temp 97.6 F (36.4 C) (Tympanic)   Resp 16   Wt 159 lb (72.1 kg)   BMI 25.66 kg/m   Filed Weights   06/01/19 0844  Weight: 159 lb (72.1 kg)    Physical Exam  Constitutional: She is oriented to person, place, and time and well-developed, well-nourished, and in no distress.  Alone.  Walking herself.  HENT:  Head: Normocephalic and atraumatic.  Mouth/Throat: Oropharynx is clear and moist. No oropharyngeal exudate.  Eyes: Pupils are equal, round, and reactive to light.  Neck: Normal range of motion. Neck supple.  Cardiovascular: Normal rate and regular rhythm.  Pulmonary/Chest: No respiratory distress. She has no wheezes.  Abdominal: Soft. Bowel sounds are normal. She exhibits no distension and no mass. There is no  abdominal tenderness. There is no rebound and no guarding.  Musculoskeletal: Normal range of motion.        General: No tenderness or edema.  Neurological: She is alert and oriented to person, place, and time.  Skin: Skin is warm.  Psychiatric: Affect normal.    LABORATORY DATA:  I have reviewed the data as listed    Component Value Date/Time   NA 139 06/01/2019 0835   NA 135 03/04/2017 0918   NA 135 01/08/2015 0850   K 3.8 06/01/2019 0835   K 3.7 01/08/2015 0850   CL 105 06/01/2019 0835   CL 101 01/08/2015 0850   CO2 27 06/01/2019 0835   CO2 26 01/08/2015 0850   GLUCOSE 95 06/01/2019 0835   GLUCOSE 161 (H) 01/08/2015 0850   BUN 18 06/01/2019 0835   BUN 8 03/04/2017 0918   BUN 17 01/08/2015 0850   CREATININE 0.73 06/01/2019 0835   CREATININE 0.82 01/22/2015 1559   CALCIUM 8.8 (L) 06/01/2019 0835   CALCIUM 9.3 01/08/2015 0850   PROT 7.0 06/01/2019 0835   PROT 6.4 03/04/2017 0918   PROT 6.8 01/22/2015 1559   ALBUMIN 4.0 06/01/2019 0835   ALBUMIN 2.5 (L) 03/04/2017 0918   ALBUMIN 3.9 01/22/2015 1559   AST 27 06/01/2019 0835   AST 34 01/22/2015 1559   ALT 29 06/01/2019 0835   ALT 42 01/22/2015 1559   ALKPHOS 124 06/01/2019 0835   ALKPHOS 157 (H) 01/22/2015 1559   BILITOT 1.2 06/01/2019 0835   BILITOT 6.2 (H) 03/04/2017 0918   BILITOT 0.2 (L) 01/22/2015 1559   GFRNONAA >60 06/01/2019 0835   GFRNONAA >60 01/22/2015 1559   GFRAA >60 06/01/2019 0835   GFRAA >60 01/22/2015 1559    No results found for: SPEP, UPEP  Lab Results  Component Value Date   WBC 3.2 (L) 06/01/2019   NEUTROABS 1.9 06/01/2019   HGB 11.9 (L) 06/01/2019   HCT 36.5 06/01/2019   MCV 91.7 06/01/2019   PLT 226 06/01/2019      Chemistry      Component Value Date/Time   NA 139 06/01/2019 0835   NA 135 03/04/2017 0918   NA 135 01/08/2015 0850   K 3.8 06/01/2019 0835   K 3.7 01/08/2015 0850   CL 105 06/01/2019 0835   CL  101 01/08/2015 0850   CO2 27 06/01/2019 0835   CO2 26 01/08/2015 0850    BUN 18 06/01/2019 0835   BUN 8 03/04/2017 0918   BUN 17 01/08/2015 0850   CREATININE 0.73 06/01/2019 0835   CREATININE 0.82 01/22/2015 1559      Component Value Date/Time   CALCIUM 8.8 (L) 06/01/2019 0835   CALCIUM 9.3 01/08/2015 0850   ALKPHOS 124 06/01/2019 0835   ALKPHOS 157 (H) 01/22/2015 1559   AST 27 06/01/2019 0835   AST 34 01/22/2015 1559   ALT 29 06/01/2019 0835   ALT 42 01/22/2015 1559   BILITOT 1.2 06/01/2019 0835   BILITOT 6.2 (H) 03/04/2017 0918   BILITOT 0.2 (L) 01/22/2015 1559       RADIOGRAPHIC STUDIES: I have personally reviewed the radiological images as listed and agreed with the findings in the report. No results found.   ASSESSMENT & PLAN:  Carcinoma of upper-outer quadrant of left breast in female, estrogen receptor positive ([place]) # Recurrent metastatic breast cancer-ER PR positive HER-2 negative; June 3rd PET scan shows-up to 3.5 cm left lobe of liver uptake; no evidence of any disease uptake anywhere else. Currently on ixempera today.  May 06, 2019 MRI shows at least 5 new lesions [largest 10 mm]  # Proceed with cycle #4 today. Labs today reviewed;  acceptable for treatment today. Proceed with onpro. ANC 1.7 today. Will scan order imaging today.   #Elevated alkaline phosphatase-likely second malignancy. 124- improving.   # Brain metastases-recurrent/likely leptomeningeal disease;  S/p WBRT [April 2019].  May 06, 2019 MRI brain shows at least 5 new lesions-3 mm to 10 mm; asymptomatic.  Discussed regarding reirradiation/also discussed the potential side effects of reirradiation.  As Ixempra has blood-brain barrier penetration-as patient is asymptomatic I think is reasonable to continue systemic therapy.  Will order repeat MRI brain at next visit.  #Peripheral neuropathy grade 1-  on Lyrica STABLE.    # Palliative care discussion with Josh today.   # DISPOSITION: # chemo today.  #3 weeks-MD/ labs-CBC CMP; Ca-27-29; CEA; Ca-15-3;  ixempra/onpro;  PET scan prior-   Dr.B     Orders Placed This Encounter  Procedures  . NM PET Image Restag (PS) Skull Base To Thigh    Standing Status:   Future    Standing Expiration Date:   05/31/2020    Order Specific Question:   ** REASON FOR EXAM (FREE TEXT)    Answer:   breast cancer; liver metastases    Order Specific Question:   If indicated for the ordered procedure, I authorize the administration of a radiopharmaceutical per Radiology protocol    Answer:   Yes    Order Specific Question:   Radiology Contrast Protocol - do NOT remove file path    Answer:   [email or web address]   All questions were answered. The patient knows to call the clinic with any problems, questions or concerns.      Cammie Sickle, MD 06/01/2019 1:13 PM

## 2019-06-01 NOTE — Progress Notes (Signed)
Proceed with treatment today per Dr. Rogue Bussing.

## 2019-06-01 NOTE — Progress Notes (Signed)
[place]  [phone] [phone]   Name: Sarah Horne Date: 06/01/2019 MRN: [id number]  DOB: Oct 22, 1955  Patient Care Team: Arnetha Courser, MD as PCP - General (Family Medicine) Wellington Hampshire, MD as PCP - Cardiology (Cardiology) Byrnett, Forest Gleason, MD (General Surgery) [name], MD (Inactive) as Attending Physician (Internal Medicine)    REASON FOR CONSULTATION: Palliative Care consult requested for this 63 y.o. female with multiple medical problems including stage IV breast cancer with leptomeningeal and liver metastases who has had disease progression on multiple previous lines of treatment.  MRI of the brain on 05/06/2019 shows a several new metastatic lesions.  She is now on treatment with Ixempra. Patient was referred to palliative care to help address goals and manage ongoing symptoms.  SOCIAL HISTORY:     reports that she has been smoking cigarettes. She has a 9.00 pack-year smoking history. She has never used smokeless tobacco. She reports that she does not drink alcohol or use drugs.   Patient is not married.  She lives at home alone.  She has a son and daughter who live about 30 minutes away.  She has a brother who is involved in her care.  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  ADVANCE DIRECTIVES:  HC POA and living will on file dated 12/12/2016.  Patient's brother is her healthcare power of attorney.  CODE STATUS:   PAST MEDICAL HISTORY: Past Medical History:  Diagnosis Date  . Chemotherapy-induced peripheral neuropathy ([place])   . Epistaxis    a. 11/2016 in setting of asa/plavix-->silver nitrate cauterization.  . GI bleed    a. 11/2016 Admission w/ GIB and hypovolemic shock req 3u PRBC's;  b. 11/2016 ECG: gastritis & nonbleeding peptic ulcer; c. 11/2016 Conlonoscopy: rectal and sigmoid colonic ulcers.  . Heart attack ([place])    a. 1998 Cath @ [facility]: reportedly no intervention  required.  Marland Kitchen Herceptin-induced cardiomyopathy ([place])    a. In the setting of Herceptin Rx for breast cancer (initiated 12/2014); b. 03/2015 MUGA EF 64%; b. 08/2015 MUGA: EF 51%; c. 10/2015 MUGA: EF 44%; d. 11/2015 Echo: EF 45-50%; e. 01/2016 MUGA: EF 60%; f. 06/2016 MUGA EF 65%; g. 10/2016 MUGA: EF 61%;  h. 12/2016 Echo: EF 55-60%, gr1 DD.  Marland Kitchen Hyperlipidemia   . Hypertension   . Neuropathy   . Possible PAD (peripheral artery disease) ([place])    a. 11/2016 LE cyanosis and weak pulses-->CTA w/o significant Ao-BiFem dzs. ? distal dzs-->ASA/Plavix initiated by vascular surgery.  Marland Kitchen PSVT (paroxysmal supraventricular tachycardia) ([place])    a. Dx 11/2016.  . Pulmonary embolism ([place])    a. 12/2016 CTA Chest: small nonocclusive PE in inferior segment of the Left lingula, somewhat eccentric filling defect suggesting chronic rather than acute embolic event; b. 03/6183 LE U/S:  No DVT; c. 12/2016 Echo: Nl RV fxn, nl PASP.  Marland Kitchen Recurrent Metastatic breast cancer ([place])    a. Dx 2016: Stage II, ER positive, PR positive, HER-2/neu overexpressing of the left breast-->chemo/radiation; b. 10/2016 CT Abd/pelvis: diffuse liver mets, ill defined sclerotic bone lesions-T12;  c. 10/2016 MRI brain: metastatic lesion along L temporal lobe (19x56m) w/ extensive surrounding edema & 535mmidline shift to right.  . Sinus tachycardia     PAST SURGICAL HISTORY:  Past Surgical History:  Procedure Laterality Date  . BREAST BIOPSY Left 2016   Positive  . BREAST LUMPECTOMY WITH SENTINEL LYMPH NODE BIOPSY Left 05/23/2015   Procedure:  LEFT BREAST WIDE EXCISION WITH AXILLARY DISSECTION, MASTOPLASTY ;  Surgeon: [name], MD;  Location: [facility] ORS;  Service: General;  Laterality: Left;  . BREAST SURGERY Left 12/18/14   breast biopsy/INVASIVE DUCTAL CARCINOMA OF BREAST, NOTTINGHAM GRADE 2.  Marland Kitchen BREAST SURGERY  05/23/2015.   Wide excision/mastoplasty, axillary dissection. No residual invasive cancer, positive for residual DCIS. 0/2 nodes identified  on axillary dissection. (no SLN by technetium or methylene blue)  . CARDIAC CATHETERIZATION    . COLONOSCOPY WITH PROPOFOL N/A 12/10/2016   Procedure: COLONOSCOPY WITH PROPOFOL;  Surgeon: Lucilla Lame, MD;  Location: [facility] ENDOSCOPY;  Service: Endoscopy;  Laterality: N/A;  . COLONOSCOPY WITH PROPOFOL N/A 02/13/2017   Procedure: COLONOSCOPY WITH PROPOFOL;  Surgeon: Lucilla Lame, MD;  Location: [facility] ENDOSCOPY;  Service: Endoscopy;  Laterality: N/A;  . ESOPHAGOGASTRODUODENOSCOPY (EGD) WITH PROPOFOL N/A 12/08/2016   Procedure: ESOPHAGOGASTRODUODENOSCOPY (EGD) WITH PROPOFOL;  Surgeon: Lucilla Lame, MD;  Location: [facility] ENDOSCOPY;  Service: Endoscopy;  Laterality: N/A;  . IVC FILTER INSERTION N/A 02/15/2017   Procedure: IVC Filter Insertion;  Surgeon: Algernon Huxley, MD;  Location: [facility];  Service: Cardiovascular;  Laterality: N/A;  . PORTACATH PLACEMENT Right 12-31-14   Dr Bary Castilla    HEMATOLOGY/ONCOLOGY HISTORY:  Oncology History Overview Note  # LEFT BREAST IDC; STAGE II [cT2N1] ER >90%; PR- 50-90%; her 2 NEU- POS; s/p Neoadj chemo; AUG 2016- TCH+P s/p Lumpec & partial ALND- path CR; s/p RT [finished Nov 2016];  adj Herceptin; HELD for Jan 19th 2017 [in DEC 2016-EF dropped from 63 to 51%; FEB 27th EF-42.9%]; JAN 2016 START Arimidex; May 2017- EF- 60%; May 24th 2017-Re-start Herceptin q 3W; July 28th STOP herceptin [finished 89m/m2; sec to Low EF; however 2017 Oct EF= 67%; improved]  # DEC 4th 2017- Start Neratinib 4 pills; DEC 11th 3 pills; STOPPED.  # FEB 2018- RECURRENCE ER/PR positive; Her 2 NEGATIVE [liver Bx]  # MARCH 1st 2018- Tax-Cytoxan [poor tol]  # FEB 2018- Brain mets [SBRT; Dr.Crystal; finished Feb 28th 2018]  # March 2018- Eribulin- poor tolerance  # June 8th 2018- Started Faslodex + Abema [abema-multiple interruptions sec to neutropenia]  # MRI Brain- March 2019- Leptomeningeal mets [WBRT; No LP s/p WBRT- 01/09/2018]; STOP Abema [554mday-sec to severe cytopenia]+faslodex   # MAY 20tth 019- Start Xeloda; STOPPED in Nov 22nd 2019- sec to Progressive liver lesions on CT scan  # NOV 26th 2019- Faslodex + Piqaray 25031m; Jan 2020- Piqray 300 mg+ faslodex; FEB 17th CT scan- mixed response/ Overall progression; STOP Piqray + faslodex.   # FEB 25th 2020- ERIBULIN; Poor tolerance sec to severe neutropenia.  Stopped May 2020.  CT scan May 2020-new/progressive 2 cm liver lesion;   #July nd 2020-Ixempra  #May 06, 2019-MRI brain 5 new lesions [3 mm to 10 mm]-asymptomatic; [previous whole brain radiation]-active surveillance  # PN- G-2; may 2017- Cymbalta 46m57mMARCH 2018-  acute vascular insuff of BIL LE [? Taxotere vasoconstriction on asprin]; # hemorragic shock LGIB [s/p colo; Dr.Wohl]  #  SVT- on flecainide.   # April 2016- Liver Bx- NEG;   # Drop in EF from Herceptin [recovered OCt 27th- EF 65%]  # BMD- jan 2017- osteopenia  -----------------------------------------------------------     [place]- Sep 4th 2018- PDL-1 0%/NEG for BRCA; MSI-Stable; Positive for PI3K; ccnd-1; FGF** -----------------------------------------------------------  Dx: Metastatic Breast cancer [ER/PR-Pos; her -2 NEG] Stage IV; goals: palliative Current treatment-Ixempra   Carcinoma of upper-outer quadrant of left breast in female, estrogen receptor positive (  [place])  11/22/2018 - 01/17/2019 Chemotherapy   The patient had eriBULin mesylate (HALAVEN) 2.1 mg in sodium chloride 0.9 % 100 mL chemo infusion, 1.2 mg/m2 = 2.1 mg (100 % of original dose 1.2 mg/m2), Intravenous,  Once, 3 of 4 cycles Dose modification: 1.2 mg/m2 (original dose 1.2 mg/m2, Cycle 1, Reason: Provider Judgment), 0.8 mg/m2 (original dose 1.2 mg/m2, Cycle 3, Reason: Provider Judgment) Administration: 2.1 mg (11/22/2018), 2.1 mg (11/29/2018), 2.1 mg (12/20/2018), 1.4 mg (01/17/2019)  for chemotherapy treatment.    03/30/2019 -  Chemotherapy   The patient had pegfilgrastim (NEULASTA ONPRO KIT)  injection 6 mg, 6 mg, Subcutaneous, Once, 4 of 6 cycles Administration: 6 mg (03/30/2019), 6 mg (04/20/2019), 6 mg (05/11/2019) ixabepilone (IXEMPRA) 60 mg in lactated ringers 250 mL chemo infusion, 33.5 mg/m2 = 57 mg (100 % of original dose 32 mg/m2), Intravenous,  Once, 4 of 6 cycles Dose modification: 32 mg/m2 (original dose 32 mg/m2, Cycle 1, Reason: Provider Judgment) Administration: 72 mg (04/20/2019), 72 mg (05/11/2019)  for chemotherapy treatment.      ALLERGIES:  is allergic to no known allergies.  MEDICATIONS:  Current Outpatient Medications  Medication Sig Dispense Refill  . flecainide (TAMBOCOR) 50 MG tablet Take 1 tablet (50 mg total) by mouth 2 (two) times daily. 180 tablet 3  . magnesium oxide (MAG-OX) 400 MG tablet TAKE 1 TABLET BY MOUTH TWICE A DAY 60 tablet 6  . pregabalin (LYRICA) 75 MG capsule Take 1 capsule (75 mg total) by mouth 2 (two) times daily. 180 capsule 0  . triamcinolone ointment (KENALOG) 0.5 % Apply 1 application topically 2 (two) times daily. 30 g 2  . Vitamin D, Ergocalciferol, (DRISDOL) 1.25 MG (50000 UT) CAPS capsule TAKE 1 CAPSULE (50,000 UNITS TOTAL) ONCE A WEEK BY MOUTH. 12 capsule 1   No current facility-administered medications for this visit.    Facility-Administered Medications Ordered in Other Visits  Medication Dose Route Frequency Provider Last Rate Last Dose  . 0.9 %  sodium chloride infusion   Intravenous Continuous Pandit, Sandeep, MD      . 0.9 %  sodium chloride infusion   Intravenous Continuous Lloyd Huger, MD 999 mL/hr at 04/24/15 1520    . heparin lock flush 100 unit/mL  500 Units Intravenous Once Berenzon, Dmitriy, MD      . sodium chloride 0.9 % injection 10 mL  10 mL Intravenous PRN [name], MD   10 mL at 04/04/15 1440  . sodium chloride flush (NS) 0.9 % injection 10 mL  10 mL Intravenous PRN Berenzon, Dmitriy, MD        VITAL SIGNS: There were no vitals taken for this visit. There were no vitals filed for this  visit.  Estimated body mass index is 25.66 kg/m as calculated from the following:   Height as of 04/20/19: _0  (1.676 m).   Weight as of an earlier encounter on 06/01/19: 159 lb (72.1 kg).  LABS: CBC:    Component Value Date/Time   WBC 3.2 (L) 06/01/2019 0835   HGB 11.9 (L) 06/01/2019 0835   HGB 10.2 (L) 03/04/2017 0918   HCT 36.5 06/01/2019 0835   HCT 33.3 (L) 03/04/2017 0918   PLT 226 06/01/2019 0835   PLT 225 03/04/2017 0918   MCV 91.7 06/01/2019 0835   MCV 89 03/04/2017 0918   MCV 90 01/22/2015 1559   NEUTROABS 1.9 06/01/2019 0835   NEUTROABS 3.1 03/04/2017 0918   NEUTROABS 34.8 (H) 01/22/2015 1559   LYMPHSABS 0.8 06/01/2019  0835   LYMPHSABS 0.7 03/04/2017 0918   LYMPHSABS 3.4 01/22/2015 1559   MONOABS 0.4 06/01/2019 0835   MONOABS 1.0 (H) 01/22/2015 1559   EOSABS 0.1 06/01/2019 0835   EOSABS 0.1 03/04/2017 0918   EOSABS 0.0 01/22/2015 1559   BASOSABS 0.0 06/01/2019 0835   BASOSABS 0.0 03/04/2017 0918   BASOSABS 0.0 01/22/2015 1559   Comprehensive Metabolic Panel:    Component Value Date/Time   NA 139 06/01/2019 0835   NA 135 03/04/2017 0918   NA 135 01/08/2015 0850   K 3.8 06/01/2019 0835   K 3.7 01/08/2015 0850   CL 105 06/01/2019 0835   CL 101 01/08/2015 0850   CO2 27 06/01/2019 0835   CO2 26 01/08/2015 0850   BUN 18 06/01/2019 0835   BUN 8 03/04/2017 0918   BUN 17 01/08/2015 0850   CREATININE 0.73 06/01/2019 0835   CREATININE 0.82 01/22/2015 1559   GLUCOSE 95 06/01/2019 0835   GLUCOSE 161 (H) 01/08/2015 0850   CALCIUM 8.8 (L) 06/01/2019 0835   CALCIUM 9.3 01/08/2015 0850   AST 27 06/01/2019 0835   AST 34 01/22/2015 1559   ALT 29 06/01/2019 0835   ALT 42 01/22/2015 1559   ALKPHOS 124 06/01/2019 0835   ALKPHOS 157 (H) 01/22/2015 1559   BILITOT 1.2 06/01/2019 0835   BILITOT 6.2 (H) 03/04/2017 0918   BILITOT 0.2 (L) 01/22/2015 1559   PROT 7.0 06/01/2019 0835   PROT 6.4 03/04/2017 0918   PROT 6.8 01/22/2015 1559   ALBUMIN 4.0 06/01/2019 0835    ALBUMIN 2.5 (L) 03/04/2017 0918   ALBUMIN 3.9 01/22/2015 1559    RADIOGRAPHIC STUDIES: Mr Jeri Cos FE Contrast  Result Date: 05/09/2019 CLINICAL DATA:  Metastatic breast cancer.  Whole-brain radiation EXAM: MRI HEAD WITHOUT AND WITH CONTRAST TECHNIQUE: Multiplanar, multiecho pulse sequences of the brain and surrounding structures were obtained without and with intravenous contrast. CONTRAST:  7 mL Gadovist IV COMPARISON:  MRI brain with contrast 08/16/2018 FINDINGS: Brain: New enhancing lesions compatible with metastatic disease: 5 mm enhancing lesion left medial cerebellum, axial image 39 4 mm left medial posterior temporal lobe axial image 58 3 mm enhancing lesion in the inferior left temporal lobe axial image 42 6 mm enhancing lesion right medial occipital lobe axial image 83 10 mm superficial enhancing lesion right frontal lobe axial image 90 Previously treated lesion in the left temporal lobe has resolved without abnormal enhancement. Negative for acute infarct. Diffuse white matter disease has progressed and is consistent with prior radiation. Vascular: Normal arterial flow voids none Skull and upper cervical spine: 12 mm lesion inferior C2 vertebral body has grown in the interval compatible with metastatic disease. No calvarial lesions. Sinuses/Orbits: Negative Other: None IMPRESSION: Five new enhancing lesions in the brain compatible with metastatic disease. Progression of C2 vertebral body lesion compatible with bony metastatic disease Progression of white matter changes related to treatment. Electronically Signed   By: Franchot Gallo M.D.   On: 05/09/2019 16:43    PERFORMANCE STATUS (ECOG) : 1 - Symptomatic but completely ambulatory  Review of Systems Unless otherwise noted, a complete review of systems is negative.  Physical Exam General: NAD, frail appearing, thin Pulmonary: Unlabored Extremities: no edema, no joint deformities Skin: no rashes Neurological: Weakness but otherwise  nonfocal  IMPRESSION: I met with patient today following her visit with Dr. Rogue Bussing.  Introduced palliative care services and attempted establish therapeutic rapport.  Patient verbalized understanding that her cancer is progressing.  Patient's goals are still  aligned with treatment for now but she seems to recognize that future options are limited.  Additional brain RT has been discussed but it is felt that it might significantly impact patient's quality of life.  We briefly discussed future option of hospice in the event the treatment options are exhausted.  Patient says that she has previously named her brother as her healthcare power of attorney.  Also note that she has a living will on file.  I reviewed with her a MOST Form today.  Patient did not seem too interested in the idea of aggressive measures at end-of-life including resuscitation or life support.  She says that she has discussed her wishes with her brother.  While she says that, her brother would personally want everything done for himself, she does think that he would honor her wishes.  She took the MOST Form home with her to review with her family.  Symptomatically, patient says that she is doing well without any acute or distressing symptoms noted today.  She does have some chronic neuropathy and issues with plantar fasciitis.  PLAN: -Continue current scope of treatment -MOST Form reviewed -RTC in 2 to 3 weeks   Patient expressed understanding and was in agreement with this plan. She also understands that She can call the clinic at any time with any questions, concerns, or complaints.     Time Total: 25 minutes  Visit consisted of counseling and education dealing with the complex and emotionally intense issues of symptom management and palliative care in the setting of serious and potentially life-threatening illness.Greater than 50%  of this time was spent counseling and coordinating care related to the above  assessment and plan.  Signed by: Altha Harm, PhD, NP-C [phone] (Work Cell)

## 2019-06-01 NOTE — Assessment & Plan Note (Addendum)
#  Recurrent metastatic breast cancer-ER PR positive HER-2 negative; June 3rd PET scan shows-up to 3.5 cm left lobe of liver uptake; no evidence of any disease uptake anywhere else. Currently on ixempera today.  May 06, 2019 MRI shows at least 5 new lesions [largest 10 mm]  # Proceed with cycle #4 today. Labs today reviewed;  acceptable for treatment today. Proceed with onpro. ANC 1.7 today. Will scan order imaging today.   #Elevated alkaline phosphatase-likely second malignancy. 124- improving.   # Brain metastases-recurrent/likely leptomeningeal disease;  S/p WBRT [April 2019].  May 06, 2019 MRI brain shows at least 5 new lesions-3 mm to 10 mm; asymptomatic.  Discussed regarding reirradiation/also discussed the potential side effects of reirradiation.  As Ixempra has blood-brain barrier penetration-as patient is asymptomatic I think is reasonable to continue systemic therapy.  Will order repeat MRI brain at next visit.  #Peripheral neuropathy grade 1-  on Lyrica STABLE.    # Palliative care discussion with Josh today.   # DISPOSITION: # chemo today.  #3 weeks-MD/ labs-CBC CMP; Ca-27-29; CEA; Ca-15-3;  ixempra/onpro; PET scan prior-   Dr.B

## 2019-06-12 ENCOUNTER — Telehealth: Payer: Self-pay | Admitting: *Deleted

## 2019-06-12 NOTE — Telephone Encounter (Signed)
Call placed to patient to verify most up to date information regarding [employer] disability claim. Per patient she remains out of work continuously at this time. I let patient know that forms would be completed and faxed.

## 2019-06-20 ENCOUNTER — Other Ambulatory Visit: Payer: Self-pay

## 2019-06-20 ENCOUNTER — Encounter
Admission: RE | Admit: 2019-06-20 | Discharge: 2019-06-20 | Disposition: A | Discharge status: Home / Self Care | Payer: Medicare HMO | Source: Ambulatory Visit | Attending: Internal Medicine | Admitting: Internal Medicine

## 2019-06-20 DIAGNOSIS — E785 Hyperlipidemia, unspecified: Secondary | ICD-10-CM | POA: Diagnosis not present

## 2019-06-20 DIAGNOSIS — F17210 Nicotine dependence, cigarettes, uncomplicated: Secondary | ICD-10-CM | POA: Insufficient documentation

## 2019-06-20 DIAGNOSIS — Z86711 Personal history of pulmonary embolism: Secondary | ICD-10-CM | POA: Diagnosis not present

## 2019-06-20 DIAGNOSIS — Z79899 Other long term (current) drug therapy: Secondary | ICD-10-CM | POA: Insufficient documentation

## 2019-06-20 DIAGNOSIS — I10 Essential (primary) hypertension: Secondary | ICD-10-CM | POA: Insufficient documentation

## 2019-06-20 DIAGNOSIS — C787 Secondary malignant neoplasm of liver and intrahepatic bile duct: Secondary | ICD-10-CM | POA: Insufficient documentation

## 2019-06-20 DIAGNOSIS — C50919 Malignant neoplasm of unspecified site of unspecified female breast: Secondary | ICD-10-CM | POA: Diagnosis not present

## 2019-06-20 DIAGNOSIS — C50412 Malignant neoplasm of upper-outer quadrant of left female breast: Secondary | ICD-10-CM | POA: Diagnosis not present

## 2019-06-20 DIAGNOSIS — Z170 Estrogen receptor positive status [ER+]: Secondary | ICD-10-CM | POA: Insufficient documentation

## 2019-06-20 LAB — GLUCOSE, CAPILLARY: Glucose-Capillary: 93 mg/dL (ref 70–99)

## 2019-06-20 MED ORDER — FLUDEOXYGLUCOSE F - 18 (FDG) INJECTION
8.2000 | Freq: Once | INTRAVENOUS | Status: AC | PRN
  Administered 2019-06-20: 8.14 via INTRAVENOUS

## 2019-06-21 ENCOUNTER — Encounter: Payer: Self-pay | Admitting: Internal Medicine

## 2019-06-21 ENCOUNTER — Other Ambulatory Visit: Payer: Self-pay | Admitting: Internal Medicine

## 2019-06-21 DIAGNOSIS — C50412 Malignant neoplasm of upper-outer quadrant of left female breast: Secondary | ICD-10-CM

## 2019-06-21 DIAGNOSIS — T451X5A Adverse effect of antineoplastic and immunosuppressive drugs, initial encounter: Secondary | ICD-10-CM

## 2019-06-21 DIAGNOSIS — G620 Drug-induced polyneuropathy: Secondary | ICD-10-CM

## 2019-06-21 NOTE — Progress Notes (Signed)
Patient here today for follow up.  Patient denies any nausea, vomiting, diarrhea, constipation, SOB, decreased appetite or pain.   Patient c/o pain in her feet

## 2019-06-22 ENCOUNTER — Other Ambulatory Visit: Payer: Self-pay

## 2019-06-22 ENCOUNTER — Inpatient Hospital Stay: Payer: Medicare HMO

## 2019-06-22 ENCOUNTER — Inpatient Hospital Stay (HOSPITAL_BASED_OUTPATIENT_CLINIC_OR_DEPARTMENT_OTHER): Payer: Medicare HMO | Admitting: Internal Medicine

## 2019-06-22 ENCOUNTER — Inpatient Hospital Stay (HOSPITAL_BASED_OUTPATIENT_CLINIC_OR_DEPARTMENT_OTHER): Payer: Medicare HMO | Admitting: Hospice and Palliative Medicine

## 2019-06-22 VITALS — BP 132/91 | HR 100 | Temp 96.4°F | Wt 156.0 lb

## 2019-06-22 DIAGNOSIS — R748 Abnormal levels of other serum enzymes: Secondary | ICD-10-CM | POA: Diagnosis not present

## 2019-06-22 DIAGNOSIS — Z515 Encounter for palliative care: Secondary | ICD-10-CM | POA: Diagnosis not present

## 2019-06-22 DIAGNOSIS — C7931 Secondary malignant neoplasm of brain: Secondary | ICD-10-CM

## 2019-06-22 DIAGNOSIS — Z170 Estrogen receptor positive status [ER+]: Secondary | ICD-10-CM

## 2019-06-22 DIAGNOSIS — Z7189 Other specified counseling: Secondary | ICD-10-CM

## 2019-06-22 DIAGNOSIS — C50412 Malignant neoplasm of upper-outer quadrant of left female breast: Secondary | ICD-10-CM

## 2019-06-22 DIAGNOSIS — Z95828 Presence of other vascular implants and grafts: Secondary | ICD-10-CM

## 2019-06-22 DIAGNOSIS — Z5111 Encounter for antineoplastic chemotherapy: Secondary | ICD-10-CM | POA: Diagnosis not present

## 2019-06-22 DIAGNOSIS — Z7689 Persons encountering health services in other specified circumstances: Secondary | ICD-10-CM | POA: Diagnosis not present

## 2019-06-22 DIAGNOSIS — T451X5A Adverse effect of antineoplastic and immunosuppressive drugs, initial encounter: Secondary | ICD-10-CM | POA: Diagnosis not present

## 2019-06-22 DIAGNOSIS — C787 Secondary malignant neoplasm of liver and intrahepatic bile duct: Secondary | ICD-10-CM | POA: Diagnosis not present

## 2019-06-22 DIAGNOSIS — G620 Drug-induced polyneuropathy: Secondary | ICD-10-CM

## 2019-06-22 LAB — COMPREHENSIVE METABOLIC PANEL
ALT: 31 U/L (ref 0–44)
AST: 31 U/L (ref 15–41)
Albumin: 4.2 g/dL (ref 3.5–5.0)
Alkaline Phosphatase: 134 U/L — ABNORMAL HIGH (ref 38–126)
Anion gap: 9 (ref 5–15)
BUN: 19 mg/dL (ref 8–23)
CO2: 27 mmol/L (ref 22–32)
Calcium: 9.3 mg/dL (ref 8.9–10.3)
Chloride: 103 mmol/L (ref 98–111)
Creatinine, Ser: 0.72 mg/dL (ref 0.44–1.00)
GFR calc Af Amer: >60 mL/min (ref >60)
GFR calc non Af Amer: >60 mL/min (ref >60)
Glucose, Bld: 95 mg/dL (ref 70–99)
Potassium: 4.2 mmol/L (ref 3.5–5.1)
Sodium: 139 mmol/L (ref 135–145)
Total Bilirubin: 1.4 mg/dL — ABNORMAL HIGH (ref 0.3–1.2)
Total Protein: 7.6 g/dL (ref 6.5–8.1)

## 2019-06-22 LAB — CBC WITH DIFFERENTIAL/PLATELET
Abs Immature Granulocytes: 0.01 10*3/uL (ref 0.00–0.07)
Basophils Absolute: 0 10*3/uL (ref 0.0–0.1)
Basophils Relative: 1 %
Eosinophils Absolute: 0.1 10*3/uL (ref 0.0–0.5)
Eosinophils Relative: 3 %
HCT: 40.3 % (ref 36.0–46.0)
Hemoglobin: 13.3 g/dL (ref 12.0–15.0)
Immature Granulocytes: 0 %
Lymphocytes Relative: 24 %
Lymphs Abs: 0.7 10*3/uL (ref 0.7–4.0)
MCH: 30.9 pg (ref 26.0–34.0)
MCHC: 33 g/dL (ref 30.0–36.0)
MCV: 93.7 fL (ref 80.0–100.0)
Monocytes Absolute: 0.4 10*3/uL (ref 0.1–1.0)
Monocytes Relative: 13 %
Neutro Abs: 1.8 10*3/uL (ref 1.7–7.7)
Neutrophils Relative %: 59 %
Platelets: 294 10*3/uL (ref 150–400)
RBC: 4.3 MIL/uL (ref 3.87–5.11)
RDW: 17.7 % — ABNORMAL HIGH (ref 11.5–15.5)
WBC: 3 10*3/uL — ABNORMAL LOW (ref 4.0–10.5)
nRBC: 0 % (ref 0.0–0.2)

## 2019-06-22 MED ORDER — DIPHENHYDRAMINE HCL 50 MG/ML IJ SOLN
50.0000 mg | Freq: Once | INTRAMUSCULAR | Status: AC
  Administered 2019-06-22: 50 mg via INTRAVENOUS
  Filled 2019-06-22: qty 1

## 2019-06-22 MED ORDER — PREGABALIN 75 MG PO CAPS: 75.0000 mg | ORAL_CAPSULE | Freq: Two times a day (BID) | ORAL | 0 refills | Status: DC

## 2019-06-22 MED ORDER — SODIUM CHLORIDE 0.9% FLUSH
10.0000 mL | Freq: Once | INTRAVENOUS | Status: AC
  Administered 2019-06-22: 10 mL via INTRAVENOUS
  Filled 2019-06-22: qty 10

## 2019-06-22 MED ORDER — PEGFILGRASTIM 6 MG/0.6ML ~~LOC~~ PSKT
6.0000 mg | PREFILLED_SYRINGE | Freq: Once | SUBCUTANEOUS | Status: AC
  Administered 2019-06-22: 6 mg via SUBCUTANEOUS
  Filled 2019-06-22: qty 0.6

## 2019-06-22 MED ORDER — SODIUM CHLORIDE 0.9 % IV SOLN
20.0000 mg | Freq: Once | INTRAVENOUS | Status: AC
  Administered 2019-06-22: 20 mg via INTRAVENOUS
  Filled 2019-06-22: qty 2

## 2019-06-22 MED ORDER — FAMOTIDINE IN NACL 20-0.9 MG/50ML-% IV SOLN
20.0000 mg | Freq: Once | INTRAVENOUS | Status: AC
  Administered 2019-06-22: 20 mg via INTRAVENOUS
  Filled 2019-06-22: qty 50

## 2019-06-22 MED ORDER — LACTATED RINGERS IV SOLN
40.0000 mg/m2 | Freq: Once | INTRAVENOUS | Status: AC
  Administered 2019-06-22: 72 mg via INTRAVENOUS
  Filled 2019-06-22: qty 22.5

## 2019-06-22 MED ORDER — SODIUM CHLORIDE 0.9 % IV SOLN
Freq: Once | INTRAVENOUS | Status: AC
  Administered 2019-06-22: 09:00:00 via INTRAVENOUS
  Filled 2019-06-22: qty 250

## 2019-06-22 MED ORDER — HEPARIN SOD (PORK) LOCK FLUSH 100 UNIT/ML IV SOLN
500.0000 [IU] | Freq: Once | INTRAVENOUS | Status: AC | PRN
  Administered 2019-06-22: 500 [IU]
  Filled 2019-06-22: qty 5

## 2019-06-22 NOTE — Assessment & Plan Note (Addendum)
#  Recurrent metastatic breast cancer-ER PR positive HER-2 negative; on Ixempra s/p 4 cycles- SEP 22nd PET scan shows-Partial response with shrinkage solitary left hepatic lobe lesion; of the no evidence of any disease uptake anywhere else.   May 06, 2019 MRI shows at least 5 new lesions [largest 10 mm]-see below  # Proceed with cycle #5 today. Labs today reviewed;  acceptable for treatment today. Proceed with onpro. ANC 1.7 today.   #Elevated alkaline phosphatase-likely second malignancy. 124- improving.   # Brain metastases-recurrent/likely leptomeningeal disease;  S/p WBRT [April 2019].  May 06, 2019 MRI brain shows at least 5 new lesions-3 mm to 10 mm; asymptomatic.  Will repeat MRI of the brain in 3 weeks/ordered today.  #Peripheral neuropathy grade 1-  on Lyrica STABLE.  New prescription given.  # DISPOSITION: # chemo today.  #3 weeks-MD/ labs-CBC CMP; Ca-27-29; CEA; Ca-15-3;  ixempra/onpro; MRI brain  prior-   Dr.B

## 2019-06-22 NOTE — Addendum Note (Signed)
Addended by: Sandria Bales B on: 06/22/2019 11:23 AM   Modules accepted: Orders

## 2019-06-22 NOTE — Progress Notes (Signed)
[place] OFFICE PROGRESS NOTE  Patient [facility] Team: Lada, Satira Anis, MD as PCP - General (Family Medicine) Wellington Hampshire, MD as PCP - Cardiology (Cardiology) Byrnett, Forest Gleason, MD (General Surgery) [name], MD (Inactive) as Attending Physician (Internal Medicine)  Cancer Staging No matching staging information was found for the patient.   Oncology History Overview Note  # LEFT BREAST IDC; STAGE II [cT2N1] ER >90%; PR- 50-90%; her 2 NEU- POS; s/p Neoadj chemo; AUG 2016- TCH+P s/p Lumpec & partial ALND- path CR; s/p RT [finished Nov 2016];  adj Herceptin; HELD for Jan 19th 2017 [in DEC 2016-EF dropped from 63 to 51%; FEB 27th EF-42.9%]; JAN 2016 START Arimidex; May 2017- EF- 60%; May 24th 2017-Re-start Herceptin q 3W; July 28th STOP herceptin [finished '86mg'$ /m2; sec to Low EF; however 2017 Oct EF= 67%; improved]  # DEC 4th 2017- Start Neratinib 4 pills; DEC 11th 3 pills; STOPPED.  # FEB 2018- RECURRENCE ER/PR positive; Her 2 NEGATIVE [liver Bx]  # MARCH 1st 2018- Tax-Cytoxan [poor tol]  # FEB 2018- Brain mets [SBRT; Dr.Crystal; finished Feb 28th 2018]  # March 2018- Eribulin- poor tolerance  # June 8th 2018- Started Faslodex + Abema [abema-multiple interruptions sec to neutropenia]  # MRI Brain- March 2019- Leptomeningeal mets [WBRT; No LP s/p WBRT- 01/09/2018]; STOP Abema ['50mg'$ /day-sec to severe cytopenia]+faslodex  # MAY 20tth 019- Start Xeloda; STOPPED in Nov 22nd 2019- sec to Progressive liver lesions on CT scan  # NOV 26th 2019- Faslodex + Piqaray '250mg'$ /d; Jan 2020- Piqray 300 mg+ faslodex; FEB 17th CT scan- mixed response/ Overall progression; STOP Piqray + faslodex.   # FEB 25th 2020- ERIBULIN; Poor tolerance sec to severe neutropenia.  Stopped May 2020.  CT scan May 2020-new/progressive 2 cm liver lesion;   #July nd 2020-Ixempra  #May 06, 2019-MRI brain 5 new lesions [3 mm to 10 mm]-asymptomatic; [previous whole brain radiation]-active  surveillance  # PN- G-2; may 2017- Cymbalta '60mg'$ /d;MARCH 2018-  acute vascular insuff of BIL LE [? Taxotere vasoconstriction on asprin]; # hemorragic shock LGIB [s/p colo; Dr.Wohl]  #  SVT- on flecainide.   # April 2016- Liver Bx- NEG;   # Drop in EF from Herceptin [recovered OCt 27th- EF 65%]  # BMD- jan 2017- osteopenia  -----------------------------------------------------------     [place]- Sep 4th 2018- PDL-1 0%/NEG for BRCA; MSI-Stable; Positive for PI3K; ccnd-1; FGF** -----------------------------------------------------------  Dx: Metastatic Breast cancer [ER/PR-Pos; her -2 NEG] Stage IV; goals: palliative Current treatment-Ixempra   Carcinoma of upper-outer quadrant of left breast in female, estrogen receptor positive ([place])  11/22/2018 - 01/17/2019 Chemotherapy   The patient had eriBULin mesylate ([place]) 2.1 mg in sodium chloride 0.9 % 100 mL chemo infusion, 1.2 mg/m2 = 2.1 mg (100 % of original dose 1.2 mg/m2), Intravenous,  Once, 3 of 4 cycles Dose modification: 1.2 mg/m2 (original dose 1.2 mg/m2, Cycle 1, Reason: Provider Judgment), 0.8 mg/m2 (original dose 1.2 mg/m2, Cycle 3, Reason: Provider Judgment) Administration: 2.1 mg (11/22/2018), 2.1 mg (11/29/2018), 2.1 mg (12/20/2018), 1.4 mg (01/17/2019)  for chemotherapy treatment.    03/30/2019 -  Chemotherapy   The patient had pegfilgrastim (NEULASTA ONPRO KIT) injection 6 mg, 6 mg, Subcutaneous, Once, 5 of 8 cycles Administration: 6 mg (03/30/2019), 6 mg (04/20/2019), 6 mg (05/11/2019), 6 mg (06/01/2019) ixabepilone (IXEMPRA) 60 mg in lactated ringers 250 mL chemo infusion, 33.5 mg/m2 = 57 mg (100 % of original dose 32 mg/m2), Intravenous,  Once, 5 of 8 cycles Dose  modification: 32 mg/m2 (original dose 32 mg/m2, Cycle 1, Reason: Provider Judgment) Administration: 72 mg (04/20/2019), 72 mg (05/11/2019), 72 mg (06/01/2019)  for chemotherapy treatment.      INTERVAL HISTORY:  Sarah Horne 63 y.o.  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  Patient denies any headaches.  Denies any nausea vomiting.  Chronic tingling and numbness in extremities.  No falls.   Review of Systems  Constitutional: Positive for malaise/fatigue. Negative for chills, diaphoresis, fever and weight loss.  HENT: Negative for nosebleeds and sore throat.   Eyes: Negative for double vision.  Respiratory: Negative for cough, hemoptysis, sputum production, shortness of breath and wheezing.   Cardiovascular: Negative for chest pain, palpitations, orthopnea and leg swelling.  Gastrointestinal: Negative for abdominal pain, blood in stool, constipation, heartburn, melena, nausea and vomiting.  Genitourinary: Negative for dysuria, frequency and urgency.  Musculoskeletal: Positive for joint pain. Negative for back pain.  Skin: Negative for itching.  Neurological: Positive for tingling. Negative for dizziness, focal weakness, weakness and headaches.  Endo/Heme/Allergies: Does not bruise/bleed easily.  Psychiatric/Behavioral: Negative for depression. The patient is not nervous/anxious and does not have insomnia.       PAST MEDICAL HISTORY :  Past Medical History:  Diagnosis Date  . Chemotherapy-induced peripheral neuropathy ([place])   . Epistaxis    a. 11/2016 in setting of asa/plavix-->silver nitrate cauterization.  . GI bleed    a. 11/2016 Admission w/ GIB and hypovolemic shock req 3u PRBC's;  b. 11/2016 ECG: gastritis & nonbleeding peptic ulcer; c. 11/2016 Conlonoscopy: rectal and sigmoid colonic ulcers.  . Heart attack ([place])    a. 1998 Cath @ [facility]: reportedly no intervention required.  [place] Kitchen Herceptin-induced cardiomyopathy ([place])    a. In the setting of Herceptin Rx for breast cancer (initiated 12/2014); b. 03/2015 MUGA EF 64%; b. 08/2015 MUGA: EF 51%; c. 10/2015  MUGA: EF 44%; d. 11/2015 Echo: EF 45-50%; e. 01/2016 MUGA: EF 60%; f. 06/2016 MUGA EF 65%; g. 10/2016 MUGA: EF 61%;  h. 12/2016 Echo: EF 55-60%, gr1 DD.  [place] Kitchen Hyperlipidemia   . Hypertension   . Neuropathy   . Possible PAD (peripheral artery disease) ([place])    a. 11/2016 LE cyanosis and weak pulses-->CTA w/o significant Ao-BiFem dzs. ? distal dzs-->ASA/Plavix initiated by vascular surgery.  [place] Kitchen PSVT (paroxysmal supraventricular tachycardia) ([place])    a. Dx 11/2016.  . Pulmonary embolism ([place])    a. 12/2016 CTA Chest: small nonocclusive PE in inferior segment of the Left lingula, somewhat eccentric filling defect suggesting chronic rather than acute embolic event; b. 09/5724 LE U/S:  No DVT; c. 12/2016 Echo: Nl RV fxn, nl PASP.  [place] Kitchen Recurrent Metastatic breast cancer ([place])    a. Dx 2016: Stage II, ER positive, PR positive, HER-2/neu overexpressing of the left breast-->chemo/radiation; b. 10/2016 CT Abd/pelvis: diffuse liver mets, ill defined sclerotic bone lesions-T12;  c. 10/2016 MRI brain: metastatic lesion along L temporal lobe (19x30m) w/ extensive surrounding edema & 539mmidline shift to right.  . Sinus tachycardia     PAST SURGICAL HISTORY :   Past Surgical History:  Procedure Laterality Date  . BREAST BIOPSY Left 2016   Positive  . BREAST LUMPECTOMY WITH SENTINEL LYMPH NODE BIOPSY Left 05/23/2015   Procedure: LEFT BREAST WIDE EXCISION WITH AXILLARY DISSECTION, MASTOPLASTY ;  Surgeon: JeRobert BellowMD;  Location:  [facility] ORS;  Service: General;  Laterality: Left;  . BREAST SURGERY Left 12/18/14   breast biopsy/INVASIVE DUCTAL CARCINOMA OF BREAST, NOTTINGHAM GRADE 2.  [place] Kitchen BREAST SURGERY  05/23/2015.   Wide excision/mastoplasty, axillary dissection. No residual invasive cancer, positive for residual DCIS. 0/2 nodes identified on axillary dissection. (no SLN by technetium or methylene blue)  . CARDIAC CATHETERIZATION    . COLONOSCOPY WITH PROPOFOL N/A 12/10/2016   Procedure: COLONOSCOPY WITH PROPOFOL;   Surgeon: Lucilla Lame, MD;  Location: [facility] ENDOSCOPY;  Service: Endoscopy;  Laterality: N/A;  . COLONOSCOPY WITH PROPOFOL N/A 02/13/2017   Procedure: COLONOSCOPY WITH PROPOFOL;  Surgeon: Lucilla Lame, MD;  Location: [facility] ENDOSCOPY;  Service: Endoscopy;  Laterality: N/A;  . ESOPHAGOGASTRODUODENOSCOPY (EGD) WITH PROPOFOL N/A 12/08/2016   Procedure: ESOPHAGOGASTRODUODENOSCOPY (EGD) WITH PROPOFOL;  Surgeon: Lucilla Lame, MD;  Location: [facility] ENDOSCOPY;  Service: Endoscopy;  Laterality: N/A;  . IVC FILTER INSERTION N/A 02/15/2017   Procedure: IVC Filter Insertion;  Surgeon: Algernon Huxley, MD;  Location: [facility] CV LAB;  Service: Cardiovascular;  Laterality: N/A;  . PORTACATH PLACEMENT Right 12-31-14   Dr Bary Castilla    FAMILY HISTORY :   Family History  Problem Relation Age of Onset  . Breast cancer Maternal Aunt   . Breast cancer Cousin   . Brain cancer Maternal Uncle   . Diabetes Mother   . Hypertension Mother   . Stroke Mother     SOCIAL HISTORY:   Social History   Tobacco Use  . Smoking status: Light Tobacco Smoker    Packs/day: 0.50    Years: 18.00    Pack years: 9.00    Types: Cigarettes  . Smokeless tobacco: Never Used  Substance Use Topics  . Alcohol use: No    Alcohol/week: 0.0 standard drinks  . Drug use: No    ALLERGIES:  is allergic to no known allergies.  MEDICATIONS:  Current Outpatient Medications  Medication Sig Dispense Refill  . flecainide (TAMBOCOR) 50 MG tablet Take 1 tablet (50 mg total) by mouth 2 (two) times daily. 180 tablet 3  . magnesium oxide (MAG-OX) 400 MG tablet TAKE 1 TABLET BY MOUTH TWICE A DAY 60 tablet 6  . pregabalin (LYRICA) 75 MG capsule Take 1 capsule (75 mg total) by mouth 2 (two) times daily. 180 capsule 0  . Vitamin D, Ergocalciferol, (DRISDOL) 1.25 MG (50000 UT) CAPS capsule TAKE 1 CAPSULE (50,000 UNITS TOTAL) ONCE A WEEK BY MOUTH. 12 capsule 1   No current facility-administered medications for this visit.    Facility-Administered  Medications Ordered in Other Visits  Medication Dose Route Frequency Provider Last Rate Last Dose  . 0.9 %  sodium chloride infusion   Intravenous Continuous Pandit, Sandeep, MD      . 0.9 %  sodium chloride infusion   Intravenous Continuous Lloyd Huger, MD 999 mL/hr at 04/24/15 1520    . dexamethasone (DECADRON) 20 mg in sodium chloride 0.9 % 50 mL IVPB  20 mg Intravenous Once Cammie Sickle, MD 208 mL/hr at 06/22/19 0948 20 mg at 06/22/19 0948  . heparin lock flush 100 unit/mL  500 Units Intravenous Once Berenzon, Dmitriy, MD      . heparin lock flush 100 unit/mL  500 Units Intracatheter Once PRN Cammie Sickle, MD      . ixabepilone (IXEMPRA) 72 mg in lactated ringers 250 mL chemo infusion  40 mg/m2 (Treatment Plan Recorded) Intravenous Once Cammie Sickle, MD      . pegfilgrastim (NEULASTA ONPRO  KIT) injection 6 mg  6 mg Subcutaneous Once Charlaine Dalton R, MD      . sodium chloride 0.9 % injection 10 mL  10 mL Intravenous PRN [name], MD   10 mL at 04/04/15 1440  . sodium chloride flush (NS) 0.9 % injection 10 mL  10 mL Intravenous PRN Berenzon, Dmitriy, MD        PHYSICAL EXAMINATION: ECOG PERFORMANCE STATUS: 1 - Symptomatic but completely ambulatory  BP (!) 132/91 (BP Location: Right Arm, Patient Position: Sitting, Cuff Size: Normal)   Pulse 100   Temp (!) 96.4 F (35.8 C) (Tympanic)   Wt 156 lb (70.8 kg)   BMI 25.18 kg/m   Filed Weights   06/22/19 0831  Weight: 156 lb (70.8 kg)    Physical Exam  Constitutional: She is oriented to person, place, and time and well-developed, well-nourished, and in no distress.  Alone.  Walking herself.  HENT:  Head: Normocephalic and atraumatic.  Mouth/Throat: Oropharynx is clear and moist. No oropharyngeal exudate.  Eyes: Pupils are equal, round, and reactive to light.  Neck: Normal range of motion. Neck supple.  Cardiovascular: Normal rate and regular rhythm.  Pulmonary/Chest: No respiratory  distress. She has no wheezes.  Abdominal: Soft. Bowel sounds are normal. She exhibits no distension and no mass. There is no abdominal tenderness. There is no rebound and no guarding.  Musculoskeletal: Normal range of motion.        General: No tenderness or edema.  Neurological: She is alert and oriented to person, place, and time.  Skin: Skin is warm.  Psychiatric: Affect normal.    LABORATORY DATA:  I have reviewed the data as listed    Component Value Date/Time   NA 139 06/22/2019 0815   NA 135 03/04/2017 0918   NA 135 01/08/2015 0850   K 4.2 06/22/2019 0815   K 3.7 01/08/2015 0850   CL 103 06/22/2019 0815   CL 101 01/08/2015 0850   CO2 27 06/22/2019 0815   CO2 26 01/08/2015 0850   GLUCOSE 95 06/22/2019 0815   GLUCOSE 161 (H) 01/08/2015 0850   BUN 19 06/22/2019 0815   BUN 8 03/04/2017 0918   BUN 17 01/08/2015 0850   CREATININE 0.72 06/22/2019 0815   CREATININE 0.82 01/22/2015 1559   CALCIUM 9.3 06/22/2019 0815   CALCIUM 9.3 01/08/2015 0850   PROT 7.6 06/22/2019 0815   PROT 6.4 03/04/2017 0918   PROT 6.8 01/22/2015 1559   ALBUMIN 4.2 06/22/2019 0815   ALBUMIN 2.5 (L) 03/04/2017 0918   ALBUMIN 3.9 01/22/2015 1559   AST 31 06/22/2019 0815   AST 34 01/22/2015 1559   ALT 31 06/22/2019 0815   ALT 42 01/22/2015 1559   ALKPHOS 134 (H) 06/22/2019 0815   ALKPHOS 157 (H) 01/22/2015 1559   BILITOT 1.4 (H) 06/22/2019 0815   BILITOT 6.2 (H) 03/04/2017 0918   BILITOT 0.2 (L) 01/22/2015 1559   GFRNONAA >60 06/22/2019 0815   GFRNONAA >60 01/22/2015 1559   GFRAA >60 06/22/2019 0815   GFRAA >60 01/22/2015 1559    No results found for: SPEP, UPEP  Lab Results  Component Value Date   WBC 3.0 (L) 06/22/2019   NEUTROABS 1.8 06/22/2019   HGB 13.3 06/22/2019   HCT 40.3 06/22/2019   MCV 93.7 06/22/2019   PLT 294 06/22/2019      Chemistry      Component Value Date/Time   NA 139 06/22/2019 0815   NA 135 03/04/2017 0918   NA 135  01/08/2015 0850   K 4.2 06/22/2019 0815    K 3.7 01/08/2015 0850   CL 103 06/22/2019 0815   CL 101 01/08/2015 0850   CO2 27 06/22/2019 0815   CO2 26 01/08/2015 0850   BUN 19 06/22/2019 0815   BUN 8 03/04/2017 0918   BUN 17 01/08/2015 0850   CREATININE 0.72 06/22/2019 0815   CREATININE 0.82 01/22/2015 1559      Component Value Date/Time   CALCIUM 9.3 06/22/2019 0815   CALCIUM 9.3 01/08/2015 0850   ALKPHOS 134 (H) 06/22/2019 0815   ALKPHOS 157 (H) 01/22/2015 1559   AST 31 06/22/2019 0815   AST 34 01/22/2015 1559   ALT 31 06/22/2019 0815   ALT 42 01/22/2015 1559   BILITOT 1.4 (H) 06/22/2019 0815   BILITOT 6.2 (H) 03/04/2017 0918   BILITOT 0.2 (L) 01/22/2015 1559       RADIOGRAPHIC STUDIES: I have personally reviewed the radiological images as listed and agreed with the findings in the report. Nm Pet Image Restag (ps) Skull Base To Thigh  Result Date: 06/20/2019 CLINICAL DATA:  Subsequent treatment strategy for metastatic breast cancer with liver metastases. EXAM: NUCLEAR MEDICINE PET SKULL BASE TO THIGH TECHNIQUE: 8.14 mCi F-18 FDG was injected intravenously. Full-ring PET imaging was performed from the skull base to thigh after the radiotracer. CT data was obtained and used for attenuation correction and anatomic localization. Fasting blood glucose: 93 mg/dl COMPARISON:  PET-CT dated 03/01/2019 FINDINGS: Mediastinal blood pool activity: SUV max 2.5 Liver activity: SUV max NA NECK: No hypermetabolic cervical lymphadenopathy. Incidental CT findings: none CHEST: Postsurgical changes in the upper/outer left breast. No residual hypermetabolism. No suspicious pulmonary nodules. No hypermetabolic thoracic lymphadenopathy. Incidental CT findings: Right chest port terminates at the cavoatrial junction. Mild coronary atherosclerosis of the LAD and left circumflex. ABDOMEN/PELVIS: 2.3 x 2.8 cm lesion in the left hepatic lobe (series 3/image 121), max SUV 4.4, previously 3.5 cm with max SUV 4.3. No hypermetabolic abdominopelvic  lymphadenopathy. Incidental CT findings: No abnormal hypermetabolism in the spleen, pancreas, or adrenal glands. IVC filter. Atherosclerotic calcifications the abdominal aorta and branch vessels. Calcified uterine fibroids. SKELETON: No focal hypermetabolic activity to suggest skeletal metastasis. Incidental CT findings: none IMPRESSION: Postsurgical changes in the upper/outer left breast. No residual hypermetabolism. Solitary metastasis in the left hepatic lobe, with stable mild hypermetabolism, although decreased in size on CT. Electronically Signed   By: Julian Hy M.D.   On: 06/20/2019 13:11     ASSESSMENT & PLAN:  Carcinoma of upper-outer quadrant of left breast in female, estrogen receptor positive ([place]) # Recurrent metastatic breast cancer-ER PR positive HER-2 negative; on Ixempra s/p 4 cycles- SEP 22nd PET scan shows-Partial response with shrinkage solitary left hepatic lobe lesion; of the no evidence of any disease uptake anywhere else.   May 06, 2019 MRI shows at least 5 new lesions [largest 10 mm]-see below  # Proceed with cycle #5 today. Labs today reviewed;  acceptable for treatment today. Proceed with onpro. ANC 1.7 today.   #Elevated alkaline phosphatase-likely second malignancy. 124- improving.   # Brain metastases-recurrent/likely leptomeningeal disease;  S/p WBRT [April 2019].  May 06, 2019 MRI brain shows at least 5 new lesions-3 mm to 10 mm; asymptomatic.  Will repeat MRI of the brain in 3 weeks/ordered today.  #Peripheral neuropathy grade 1-  on Lyrica STABLE.  New prescription given.  # DISPOSITION: # chemo today.  #3 weeks-MD/ labs-CBC CMP; Ca-27-29; CEA; Ca-15-3;  ixempra/onpro; MRI brain  prior-  Dr.B     Orders Placed This Encounter  Procedures  . MR Brain W Wo Contrast    Standing Status:   Future    Standing Expiration Date:   06/21/2020    Order Specific Question:   ** REASON FOR EXAM (FREE TEXT)    Answer:   brain metastases on chemo    Order  Specific Question:   If indicated for the ordered procedure, I authorize the administration of contrast media per Radiology protocol    Answer:   Yes    Order Specific Question:   What is the patient's sedation requirement?    Answer:   No Sedation    Order Specific Question:   Does the patient have a pacemaker or implanted devices?    Answer:   No    Order Specific Question:   Use SRS Protocol?    Answer:   No    Order Specific Question:   Radiology Contrast Protocol - do NOT remove file path    Answer:   [email or web address]    Order Specific Question:   Preferred imaging location?    Answer:   [facility] (table limit-400lbs)   All questions were answered. The patient knows to call the clinic with any problems, questions or concerns.      Cammie Sickle, MD 06/22/2019 9:50 AM

## 2019-06-23 NOTE — Progress Notes (Signed)
[place]  Telephone:([phone] [phone]   Name: Sarah Horne Date: 06/23/2019 MRN: [id number]  DOB: 12-02-1955  Patient Care Team: Arnetha Courser, MD as PCP - General (Family Medicine) Wellington Hampshire, MD as PCP - Cardiology (Cardiology) Byrnett, Forest Gleason, MD (General Surgery) [name], MD (Inactive) as Attending Physician (Internal Medicine)    REASON FOR CONSULTATION: Palliative Care consult requested for this 64 y.o. female with multiple medical problems including stage IV breast cancer with leptomeningeal and liver metastases who has had disease progression on multiple previous lines of treatment.  MRI of the brain on 05/06/2019 shows a several new metastatic lesions.  She is now on treatment with Ixempra. Patient was referred to palliative care to help address goals and manage ongoing symptoms.  SOCIAL HISTORY:     reports that she has been smoking cigarettes. She has a 9.00 pack-year smoking history. She has never used smokeless tobacco. She reports that she does not drink alcohol or use drugs.   Patient is not married.  She lives at home alone.  She has a son and daughter who live about 30 minutes away.  She has a brother who is involved in her care.  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  ADVANCE DIRECTIVES:  HC POA and living will on file dated 12/12/2016.  Patient's brother is her healthcare power of attorney.  CODE STATUS:   PAST MEDICAL HISTORY: Past Medical History:  Diagnosis Date   Chemotherapy-induced peripheral neuropathy ([place])    Epistaxis    a. 11/2016 in setting of asa/plavix-->silver nitrate cauterization.   GI bleed    a. 11/2016 Admission w/ GIB and hypovolemic shock req 3u PRBC's;  b. 11/2016 ECG: gastritis & nonbleeding peptic ulcer; c. 11/2016 Conlonoscopy: rectal and sigmoid colonic ulcers.   Heart attack ([place])    a. 1998 Cath @ [facility]: reportedly no intervention  required.   Herceptin-induced cardiomyopathy ([place])    a. In the setting of Herceptin Rx for breast cancer (initiated 12/2014); b. 03/2015 MUGA EF 64%; b. 08/2015 MUGA: EF 51%; c. 10/2015 MUGA: EF 44%; d. 11/2015 Echo: EF 45-50%; e. 01/2016 MUGA: EF 60%; f. 06/2016 MUGA EF 65%; g. 10/2016 MUGA: EF 61%;  h. 12/2016 Echo: EF 55-60%, gr1 DD.   Hyperlipidemia    Hypertension    Neuropathy    Possible PAD (peripheral artery disease) ([place])    a. 11/2016 LE cyanosis and weak pulses-->CTA w/o significant Ao-BiFem dzs. ? distal dzs-->ASA/Plavix initiated by vascular surgery.   PSVT (paroxysmal supraventricular tachycardia) ([place])    a. Dx 11/2016.   Pulmonary embolism ([place])    a. 12/2016 CTA Chest: small nonocclusive PE in inferior segment of the Left lingula, somewhat eccentric filling defect suggesting chronic rather than acute embolic event; b. 12/2351 LE U/S:  No DVT; c. 12/2016 Echo: Nl RV fxn, nl PASP.   Recurrent Metastatic breast cancer ([place])    a. Dx 2016: Stage II, ER positive, PR positive, HER-2/neu overexpressing of the left breast-->chemo/radiation; b. 10/2016 CT Abd/pelvis: diffuse liver mets, ill defined sclerotic bone lesions-T12;  c. 10/2016 MRI brain: metastatic lesion along L temporal lobe (19x6m) w/ extensive surrounding edema & 586mmidline shift to right.   Sinus tachycardia     PAST SURGICAL HISTORY:  Past Surgical History:  Procedure Laterality Date   BREAST BIOPSY Left 2016   Positive   BREAST LUMPECTOMY WITH SENTINEL LYMPH NODE BIOPSY Left 05/23/2015   Procedure:  LEFT BREAST WIDE EXCISION WITH AXILLARY DISSECTION, MASTOPLASTY ;  Surgeon: [name], MD;  Location: [facility] ORS;  Service: General;  Laterality: Left;   BREAST SURGERY Left 12/18/14   breast biopsy/INVASIVE DUCTAL CARCINOMA OF BREAST, NOTTINGHAM GRADE 2.   BREAST SURGERY  05/23/2015.   Wide excision/mastoplasty, axillary dissection. No residual invasive cancer, positive for residual DCIS. 0/2 nodes identified  on axillary dissection. (no SLN by technetium or methylene blue)   CARDIAC CATHETERIZATION     COLONOSCOPY WITH PROPOFOL N/A 12/10/2016   Procedure: COLONOSCOPY WITH PROPOFOL;  Surgeon: Lucilla Lame, MD;  Location: [facility] ENDOSCOPY;  Service: Endoscopy;  Laterality: N/A;   COLONOSCOPY WITH PROPOFOL N/A 02/13/2017   Procedure: COLONOSCOPY WITH PROPOFOL;  Surgeon: Lucilla Lame, MD;  Location: [facility] ENDOSCOPY;  Service: Endoscopy;  Laterality: N/A;   ESOPHAGOGASTRODUODENOSCOPY (EGD) WITH PROPOFOL N/A 12/08/2016   Procedure: ESOPHAGOGASTRODUODENOSCOPY (EGD) WITH PROPOFOL;  Surgeon: Lucilla Lame, MD;  Location: [facility] ENDOSCOPY;  Service: Endoscopy;  Laterality: N/A;   IVC FILTER INSERTION N/A 02/15/2017   Procedure: IVC Filter Insertion;  Surgeon: Algernon Huxley, MD;  Location: [place] [facility] LAB;  Service: Cardiovascular;  Laterality: N/A;   PORTACATH PLACEMENT Right 12-31-14   Dr Bary Castilla    HEMATOLOGY/ONCOLOGY HISTORY:  Oncology History Overview Note  # LEFT BREAST IDC; STAGE II [cT2N1] ER >90%; PR- 50-90%; her 2 NEU- POS; s/p Neoadj chemo; AUG 2016- TCH+P s/p Lumpec & partial ALND- path CR; s/p RT [finished Nov 2016];  adj Herceptin; HELD for Jan 19th 2017 [in DEC 2016-EF dropped from 63 to 51%; FEB 27th EF-42.9%]; JAN 2016 START Arimidex; May 2017- EF- 60%; May 24th 2017-Re-start Herceptin q 3W; July 28th STOP herceptin [finished 34m/m2; sec to Low EF; however 2017 Oct EF= 67%; improved]  # DEC 4th 2017- Start Neratinib 4 pills; DEC 11th 3 pills; STOPPED.  # FEB 2018- RECURRENCE ER/PR positive; Her 2 NEGATIVE [liver Bx]  # MARCH 1st 2018- Tax-Cytoxan [poor tol]  # FEB 2018- Brain mets [SBRT; Dr.Crystal; finished Feb 28th 2018]  # March 2018- Eribulin- poor tolerance  # June 8th 2018- Started Faslodex + Abema [abema-multiple interruptions sec to neutropenia]  # MRI Brain- March 2019- Leptomeningeal mets [WBRT; No LP s/p WBRT- 01/09/2018]; STOP Abema [540mday-sec to severe  cytopenia]+faslodex  # MAY 20tth 019- Start Xeloda; STOPPED in Nov 22nd 2019- sec to Progressive liver lesions on CT scan  # NOV 26th 2019- Faslodex + Piqaray 2501m; Jan 2020- Piqray 300 mg+ faslodex; FEB 17th CT scan- mixed response/ Overall progression; STOP Piqray + faslodex.   # FEB 25th 2020- ERIBULIN; Poor tolerance sec to severe neutropenia.  Stopped May 2020.  CT scan May 2020-new/progressive 2 cm liver lesion;   #July nd 2020-Ixempra  #May 06, 2019-MRI brain 5 new lesions [3 mm to 10 mm]-asymptomatic; [previous whole brain radiation]-active surveillance  # PN- G-2; may 2017- Cymbalta 55m10mMARCH 2018-  acute vascular insuff of BIL LE [? Taxotere vasoconstriction on asprin]; # hemorragic shock LGIB [s/p colo; Dr.Wohl]  #  SVT- on flecainide.   # April 2016- Liver Bx- NEG;   # Drop in EF from Herceptin [recovered OCt 27th- EF 65%]  # BMD- jan 2017- osteopenia  -----------------------------------------------------------     [place]- Sep 4th 2018- PDL-1 0%/NEG for BRCA; MSI-Stable; Positive for PI3K; ccnd-1; FGF** -----------------------------------------------------------  Dx: Metastatic Breast cancer [ER/PR-Pos; her -2 NEG] Stage IV; goals: palliative Current treatment-Ixempra   Carcinoma of upper-outer quadrant of left breast in female, estrogen receptor positive (  [place])  11/22/2018 - 01/17/2019 Chemotherapy   The patient had eriBULin mesylate (HALAVEN) 2.1 mg in sodium chloride 0.9 % 100 mL chemo infusion, 1.2 mg/m2 = 2.1 mg (100 % of original dose 1.2 mg/m2), Intravenous,  Once, 3 of 4 cycles Dose modification: 1.2 mg/m2 (original dose 1.2 mg/m2, Cycle 1, Reason: Provider Judgment), 0.8 mg/m2 (original dose 1.2 mg/m2, Cycle 3, Reason: Provider Judgment) Administration: 2.1 mg (11/22/2018), 2.1 mg (11/29/2018), 2.1 mg (12/20/2018), 1.4 mg (01/17/2019)  for chemotherapy treatment.    03/30/2019 -  Chemotherapy   The patient had pegfilgrastim  (NEULASTA ONPRO KIT) injection 6 mg, 6 mg, Subcutaneous, Once, 5 of 8 cycles Administration: 6 mg (03/30/2019), 6 mg (04/20/2019), 6 mg (05/11/2019), 6 mg (06/22/2019), 6 mg (06/01/2019) ixabepilone (IXEMPRA) 60 mg in lactated ringers 250 mL chemo infusion, 33.5 mg/m2 = 57 mg (100 % of original dose 32 mg/m2), Intravenous,  Once, 5 of 8 cycles Dose modification: 32 mg/m2 (original dose 32 mg/m2, Cycle 1, Reason: Provider Judgment) Administration: 72 mg (04/20/2019), 72 mg (05/11/2019), 72 mg (06/22/2019), 72 mg (06/01/2019)  for chemotherapy treatment.      ALLERGIES:  is allergic to no known allergies.  MEDICATIONS:  Current Outpatient Medications  Medication Sig Dispense Refill   flecainide (TAMBOCOR) 50 MG tablet Take 1 tablet (50 mg total) by mouth 2 (two) times daily. 180 tablet 3   magnesium oxide (MAG-OX) 400 MG tablet TAKE 1 TABLET BY MOUTH TWICE A DAY 60 tablet 6   pregabalin (LYRICA) 75 MG capsule Take 1 capsule (75 mg total) by mouth 2 (two) times daily. 180 capsule 0   Vitamin D, Ergocalciferol, (DRISDOL) 1.25 MG (50000 UT) CAPS capsule TAKE 1 CAPSULE (50,000 UNITS TOTAL) ONCE A WEEK BY MOUTH. 12 capsule 1   No current facility-administered medications for this visit.    Facility-Administered Medications Ordered in Other Visits  Medication Dose Route Frequency Provider Last Rate Last Dose   0.9 %  sodium chloride infusion   Intravenous Continuous Pandit, Sandeep, MD       0.9 %  sodium chloride infusion   Intravenous Continuous Lloyd Huger, MD 999 mL/hr at 04/24/15 1520     heparin lock flush 100 unit/mL  500 Units Intravenous Once Berenzon, Dmitriy, MD       sodium chloride 0.9 % injection 10 mL  10 mL Intravenous PRN [name], MD   10 mL at 04/04/15 1440   sodium chloride flush (NS) 0.9 % injection 10 mL  10 mL Intravenous PRN Berenzon, Dmitriy, MD        VITAL SIGNS: There were no vitals taken for this visit. There were no vitals filed for this visit.   Estimated body mass index is 25.18 kg/m as calculated from the following:   Height as of 04/20/19: 5' 6"  (1.676 m).   Weight as of an earlier encounter on 06/22/19: 156 lb (70.8 kg).  LABS: CBC:    Component Value Date/Time   WBC 3.0 (L) 06/22/2019 0815   HGB 13.3 06/22/2019 0815   HGB 10.2 (L) 03/04/2017 0918   HCT 40.3 06/22/2019 0815   HCT 33.3 (L) 03/04/2017 0918   PLT 294 06/22/2019 0815   PLT 225 03/04/2017 0918   MCV 93.7 06/22/2019 0815   MCV 89 03/04/2017 0918   MCV 90 01/22/2015 1559   NEUTROABS 1.8 06/22/2019 0815   NEUTROABS 3.1 03/04/2017 0918   NEUTROABS 34.8 (H) 01/22/2015 1559   LYMPHSABS 0.7 06/22/2019 0815   LYMPHSABS 0.7 03/04/2017 0932  LYMPHSABS 3.4 01/22/2015 1559   MONOABS 0.4 06/22/2019 0815   MONOABS 1.0 (H) 01/22/2015 1559   EOSABS 0.1 06/22/2019 0815   EOSABS 0.1 03/04/2017 0918   EOSABS 0.0 01/22/2015 1559   BASOSABS 0.0 06/22/2019 0815   BASOSABS 0.0 03/04/2017 0918   BASOSABS 0.0 01/22/2015 1559   Comprehensive Metabolic Panel:    Component Value Date/Time   NA 139 06/22/2019 0815   NA 135 03/04/2017 0918   NA 135 01/08/2015 0850   K 4.2 06/22/2019 0815   K 3.7 01/08/2015 0850   CL 103 06/22/2019 0815   CL 101 01/08/2015 0850   CO2 27 06/22/2019 0815   CO2 26 01/08/2015 0850   BUN 19 06/22/2019 0815   BUN 8 03/04/2017 0918   BUN 17 01/08/2015 0850   CREATININE 0.72 06/22/2019 0815   CREATININE 0.82 01/22/2015 1559   GLUCOSE 95 06/22/2019 0815   GLUCOSE 161 (H) 01/08/2015 0850   CALCIUM 9.3 06/22/2019 0815   CALCIUM 9.3 01/08/2015 0850   AST 31 06/22/2019 0815   AST 34 01/22/2015 1559   ALT 31 06/22/2019 0815   ALT 42 01/22/2015 1559   ALKPHOS 134 (H) 06/22/2019 0815   ALKPHOS 157 (H) 01/22/2015 1559   BILITOT 1.4 (H) 06/22/2019 0815   BILITOT 6.2 (H) 03/04/2017 0918   BILITOT 0.2 (L) 01/22/2015 1559   PROT 7.6 06/22/2019 0815   PROT 6.4 03/04/2017 0918   PROT 6.8 01/22/2015 1559   ALBUMIN 4.2 06/22/2019 0815   ALBUMIN  2.5 (L) 03/04/2017 0918   ALBUMIN 3.9 01/22/2015 1559    RADIOGRAPHIC STUDIES: Nm Pet Image Restag (ps) Skull Base To Thigh  Result Date: 06/20/2019 CLINICAL DATA:  Subsequent treatment strategy for metastatic breast cancer with liver metastases. EXAM: NUCLEAR MEDICINE PET SKULL BASE TO THIGH TECHNIQUE: 8.14 mCi F-18 FDG was injected intravenously. Full-ring PET imaging was performed from the skull base to thigh after the radiotracer. CT data was obtained and used for attenuation correction and anatomic localization. Fasting blood glucose: 93 mg/dl COMPARISON:  PET-CT dated 03/01/2019 FINDINGS: Mediastinal blood pool activity: SUV max 2.5 Liver activity: SUV max NA NECK: No hypermetabolic cervical lymphadenopathy. Incidental CT findings: none CHEST: Postsurgical changes in the upper/outer left breast. No residual hypermetabolism. No suspicious pulmonary nodules. No hypermetabolic thoracic lymphadenopathy. Incidental CT findings: Right chest port terminates at the cavoatrial junction. Mild coronary atherosclerosis of the LAD and left circumflex. ABDOMEN/PELVIS: 2.3 x 2.8 cm lesion in the left hepatic lobe (series 3/image 121), max SUV 4.4, previously 3.5 cm with max SUV 4.3. No hypermetabolic abdominopelvic lymphadenopathy. Incidental CT findings: No abnormal hypermetabolism in the spleen, pancreas, or adrenal glands. IVC filter. Atherosclerotic calcifications the abdominal aorta and branch vessels. Calcified uterine fibroids. SKELETON: No focal hypermetabolic activity to suggest skeletal metastasis. Incidental CT findings: none IMPRESSION: Postsurgical changes in the upper/outer left breast. No residual hypermetabolism. Solitary metastasis in the left hepatic lobe, with stable mild hypermetabolism, although decreased in size on CT. Electronically Signed   By: Julian Hy M.D.   On: 06/20/2019 13:11    PERFORMANCE STATUS (ECOG) : 1 - Symptomatic but completely ambulatory  Review of Systems Unless  otherwise noted, a complete review of systems is negative.  Physical Exam General: NAD, frail appearing, thin Pulmonary: Unlabored Extremities: no edema, no joint deformities Skin: no rashes Neurological: Weakness but otherwise nonfocal  IMPRESSION: I met with patient today following her visit with Dr. Rogue Bussing.  Routine follow-up visit made today.  Patient was seen in the  infusion area.  Patient reports doing reasonably well.  She denies acute changes or concerns.  Patient says that she did not feel up to talking today as she is tired after receiving dose of diphenhydramine.  She says that she would be interested in completing a most form another day.  PLAN: -Continue current scope of treatment -MOST Form previously reviewed -RTC in 3-4 weeks   Patient expressed understanding and was in agreement with this plan. She also understands that She can call the clinic at any time with any questions, concerns, or complaints.     Time Total: 10 minutes  Visit consisted of counseling and education dealing with the complex and emotionally intense issues of symptom management and palliative care in the setting of serious and potentially life-threatening illness.Greater than 50%  of this time was spent counseling and coordinating care related to the above assessment and plan.  Signed by: Altha Harm, PhD, NP-C [phone] (Work Cell)

## 2019-06-25 ENCOUNTER — Other Ambulatory Visit: Payer: Self-pay | Admitting: Internal Medicine

## 2019-06-25 DIAGNOSIS — C50412 Malignant neoplasm of upper-outer quadrant of left female breast: Secondary | ICD-10-CM

## 2019-06-25 DIAGNOSIS — G620 Drug-induced polyneuropathy: Secondary | ICD-10-CM

## 2019-06-27 ENCOUNTER — Other Ambulatory Visit: Payer: Self-pay | Admitting: *Deleted

## 2019-06-27 DIAGNOSIS — C50412 Malignant neoplasm of upper-outer quadrant of left female breast: Secondary | ICD-10-CM

## 2019-06-27 DIAGNOSIS — Z170 Estrogen receptor positive status [ER+]: Secondary | ICD-10-CM

## 2019-06-27 DIAGNOSIS — T451X5A Adverse effect of antineoplastic and immunosuppressive drugs, initial encounter: Secondary | ICD-10-CM

## 2019-06-27 DIAGNOSIS — G620 Drug-induced polyneuropathy: Secondary | ICD-10-CM

## 2019-06-27 MED ORDER — PREGABALIN 75 MG PO CAPS: 75.0000 mg | ORAL_CAPSULE | Freq: Two times a day (BID) | ORAL | 0 refills | Status: DC

## 2019-06-27 NOTE — Telephone Encounter (Signed)
Med was approved Thursday, but it printed and was not sent to pharmacy

## 2019-07-05 ENCOUNTER — Other Ambulatory Visit: Payer: Self-pay | Admitting: Internal Medicine

## 2019-07-06 ENCOUNTER — Ambulatory Visit
Admission: RE | Admit: 2019-07-06 | Discharge: 2019-07-06 | Disposition: A | Discharge status: Home / Self Care | Payer: Medicare HMO | Source: Ambulatory Visit | Attending: Internal Medicine | Admitting: Internal Medicine

## 2019-07-06 ENCOUNTER — Other Ambulatory Visit: Payer: Self-pay

## 2019-07-06 DIAGNOSIS — C50412 Malignant neoplasm of upper-outer quadrant of left female breast: Secondary | ICD-10-CM | POA: Diagnosis not present

## 2019-07-06 DIAGNOSIS — Z170 Estrogen receptor positive status [ER+]: Secondary | ICD-10-CM | POA: Insufficient documentation

## 2019-07-06 DIAGNOSIS — C50919 Malignant neoplasm of unspecified site of unspecified female breast: Secondary | ICD-10-CM | POA: Diagnosis not present

## 2019-07-06 DIAGNOSIS — C7931 Secondary malignant neoplasm of brain: Secondary | ICD-10-CM | POA: Diagnosis not present

## 2019-07-06 MED ORDER — GADOBUTROL 1 MMOL/ML IV SOLN
7.0000 mL | Freq: Once | INTRAVENOUS | Status: AC | PRN
  Administered 2019-07-06: 12:00:00 7 mL via INTRAVENOUS

## 2019-07-09 ENCOUNTER — Other Ambulatory Visit: Payer: Self-pay | Admitting: Physician Assistant

## 2019-07-10 NOTE — Telephone Encounter (Signed)
Virtual Visit Pre-Appointment Phone Call  "(Name), I am calling you today to discuss your upcoming appointment. We are currently trying to limit exposure to the virus that causes COVID-19 by seeing patients at home rather than in the office."  1. "What is the BEST phone number to call the day of the visit?" - include this in appointment notes  2. Do you have or have access to (through a family member/friend) a smartphone with video capability that we can use for your visit?" a. If yes - list this number in appt notes as cell (if different from BEST phone #) and list the appointment type as a VIDEO visit in appointment notes b. If no - list the appointment type as a PHONE visit in appointment notes  3. Confirm consent - "In the setting of the current Covid19 crisis, you are scheduled for a (phone or video) visit with your provider on (date) at (time).  Just as we do with many in-office visits, in order for you to participate in this visit, we must obtain consent.  If you'd like, I can send this to your mychart (if signed up) or email for you to review.  Otherwise, I can obtain your verbal consent now.  All virtual visits are billed to your insurance company just like a normal visit would be.  By agreeing to a virtual visit, we'd like you to understand that the technology does not allow for your provider to perform an examination, and thus may limit your provider's ability to fully assess your condition. If your provider identifies any concerns that need to be evaluated in person, we will make arrangements to do so.  Finally, though the technology is pretty good, we cannot assure that it will always work on either your or our end, and in the setting of a video visit, we may have to convert it to a phone-only visit.  In either situation, we cannot ensure that we have a secure connection.  Are you willing to proceed?" STAFF: Did the patient verbally acknowledge consent to telehealth visit? Document  YES/NO here: yes  4. Advise patient to be prepared - "Two hours prior to your appointment, go ahead and check your blood pressure, pulse, oxygen saturation, and your weight (if you have the equipment to check those) and write them all down. When your visit starts, your provider will ask you for this information. If you have an Apple Watch or Kardia device, please plan to have heart rate information ready on the day of your appointment. Please have a pen and paper handy nearby the day of the visit as well."  5. Give patient instructions for MyChart download to smartphone OR Doximity/Doxy.me as below if video visit (depending on what platform provider is using)  6. Inform patient they will receive a phone call 15 minutes prior to their appointment time (may be from unknown caller ID) so they should be prepared to answer    TELEPHONE CALL NOTE  Sarah Horne has been deemed a candidate for a follow-up tele-health visit to limit community exposure during the Covid-19 pandemic. I spoke with the patient via phone to ensure availability of phone/video source, confirm preferred email & phone number, and discuss instructions and expectations.  I reminded Sarah Horne to be prepared with any vital sign and/or heart rhythm information that could potentially be obtained via home monitoring, at the time of her visit. I reminded Sarah Horne to expect a phone call prior to  her visit.  Clarisse Gouge 07/10/2019 11:37 AM   INSTRUCTIONS FOR DOWNLOADING THE MYCHART APP TO SMARTPHONE  - The patient must first make sure to have activated MyChart and know their login information - If Apple, go to [employer] and type in MyChart in the search bar and download the app. If Android, ask patient to go to [employer] and type in [place] in the search bar and download the app. The app is free but as with any other app downloads, their phone may require them to verify saved payment information or  Apple/Android password.  - The patient will need to then log into the app with their MyChart username and password, and select [facility] as their healthcare provider to link the account. When it is time for your visit, go to the MyChart app, find appointments, and click Begin Video Visit. Be sure to Select Allow for your device to access the Microphone and Camera for your visit. You will then be connected, and your provider will be with you shortly.  **If they have any issues connecting, or need assistance please contact MyChart service desk [phone])**  **If using a computer, in order to ensure the best quality for their visit they will need to use either of the following Internet Browsers: [employer], or Google Chrome**  IF USING DOXIMITY or DOXY.ME - The patient will receive a link just prior to their visit by text.     FULL LENGTH CONSENT FOR TELE-HEALTH VISIT   I hereby voluntarily request, consent and authorize [facility] and its employed or contracted physicians, physician assistants, nurse practitioners or other licensed health care professionals (the Practitioner), to provide me with telemedicine health care services (the Services") as deemed necessary by the treating Practitioner. I acknowledge and consent to receive the Services by the Practitioner via telemedicine. I understand that the telemedicine visit will involve communicating with the Practitioner through live audiovisual communication technology and the disclosure of certain medical information by electronic transmission. I acknowledge that I have been given the opportunity to request an in-person assessment or other available alternative prior to the telemedicine visit and am voluntarily participating in the telemedicine visit.  I understand that I have the right to withhold or withdraw my consent to the use of telemedicine in the course of my care at any time, without affecting my right to future care  or treatment, and that the Practitioner or I may terminate the telemedicine visit at any time. I understand that I have the right to inspect all information obtained and/or recorded in the course of the telemedicine visit and may receive copies of available information for a reasonable fee.  I understand that some of the potential risks of receiving the Services via telemedicine include:   Delay or interruption in medical evaluation due to technological equipment failure or disruption;  Information transmitted may not be sufficient (e.g. poor resolution of images) to allow for appropriate medical decision making by the Practitioner; and/or   In rare instances, security protocols could fail, causing a breach of personal health information.  Furthermore, I acknowledge that it is my responsibility to provide information about my medical history, conditions and care that is complete and accurate to the best of my ability. I acknowledge that Practitioner's advice, recommendations, and/or decision may be based on factors not within their control, such as incomplete or inaccurate data provided by me or distortions of diagnostic images or specimens that may result from electronic transmissions. I  understand that the practice of medicine is not an exact science and that Practitioner makes no warranties or guarantees regarding treatment outcomes. I acknowledge that I will receive a copy of this consent concurrently upon execution via email to the email address I last provided but may also request a printed copy by calling the office of [facility].    I understand that my insurance will be billed for this visit.   I have read or had this consent read to me.  I understand the contents of this consent, which adequately explains the benefits and risks of the Services being provided via telemedicine.   I have been provided ample opportunity to ask questions regarding this consent and the Services and have had  my questions answered to my satisfaction.  I give my informed consent for the services to be provided through the use of telemedicine in my medical care  By participating in this telemedicine visit I agree to the above.

## 2019-07-10 NOTE — Telephone Encounter (Signed)
Please schedule overdue F/U with Dr. Arida. Thank you! 

## 2019-07-10 NOTE — Telephone Encounter (Signed)
Scheduled

## 2019-07-12 ENCOUNTER — Encounter: Payer: Self-pay | Admitting: Internal Medicine

## 2019-07-12 ENCOUNTER — Other Ambulatory Visit: Payer: Self-pay

## 2019-07-12 NOTE — Progress Notes (Signed)
Pre-visit assessment completed prior to [place] appointment on 07/13/2019 with Dr. Rogue Bussing. She had a brain MRI on 07/06/2019. She continues to have significant neuropathy in her feet which is bothersome. Otherwise, she does not have any concerns.

## 2019-07-13 ENCOUNTER — Inpatient Hospital Stay (HOSPITAL_BASED_OUTPATIENT_CLINIC_OR_DEPARTMENT_OTHER): Payer: Medicare HMO | Admitting: Hospice and Palliative Medicine

## 2019-07-13 ENCOUNTER — Inpatient Hospital Stay (HOSPITAL_BASED_OUTPATIENT_CLINIC_OR_DEPARTMENT_OTHER): Payer: Medicare HMO | Admitting: Internal Medicine

## 2019-07-13 ENCOUNTER — Inpatient Hospital Stay: Payer: Medicare HMO

## 2019-07-13 ENCOUNTER — Inpatient Hospital Stay: Payer: Medicare HMO | Attending: Internal Medicine

## 2019-07-13 ENCOUNTER — Other Ambulatory Visit: Payer: Self-pay

## 2019-07-13 DIAGNOSIS — C50412 Malignant neoplasm of upper-outer quadrant of left female breast: Secondary | ICD-10-CM

## 2019-07-13 DIAGNOSIS — M8580 Other specified disorders of bone density and structure, unspecified site: Secondary | ICD-10-CM | POA: Diagnosis not present

## 2019-07-13 DIAGNOSIS — Z803 Family history of malignant neoplasm of breast: Secondary | ICD-10-CM | POA: Insufficient documentation

## 2019-07-13 DIAGNOSIS — Z7902 Long term (current) use of antithrombotics/antiplatelets: Secondary | ICD-10-CM | POA: Insufficient documentation

## 2019-07-13 DIAGNOSIS — Z602 Problems related to living alone: Secondary | ICD-10-CM | POA: Insufficient documentation

## 2019-07-13 DIAGNOSIS — G629 Polyneuropathy, unspecified: Secondary | ICD-10-CM | POA: Diagnosis not present

## 2019-07-13 DIAGNOSIS — Z515 Encounter for palliative care: Secondary | ICD-10-CM

## 2019-07-13 DIAGNOSIS — F17210 Nicotine dependence, cigarettes, uncomplicated: Secondary | ICD-10-CM | POA: Diagnosis not present

## 2019-07-13 DIAGNOSIS — E785 Hyperlipidemia, unspecified: Secondary | ICD-10-CM | POA: Insufficient documentation

## 2019-07-13 DIAGNOSIS — Z7189 Other specified counseling: Secondary | ICD-10-CM | POA: Diagnosis not present

## 2019-07-13 DIAGNOSIS — Z170 Estrogen receptor positive status [ER+]: Secondary | ICD-10-CM | POA: Insufficient documentation

## 2019-07-13 DIAGNOSIS — R748 Abnormal levels of other serum enzymes: Secondary | ICD-10-CM | POA: Diagnosis not present

## 2019-07-13 DIAGNOSIS — C787 Secondary malignant neoplasm of liver and intrahepatic bile duct: Secondary | ICD-10-CM | POA: Insufficient documentation

## 2019-07-13 DIAGNOSIS — I10 Essential (primary) hypertension: Secondary | ICD-10-CM | POA: Diagnosis not present

## 2019-07-13 DIAGNOSIS — C7931 Secondary malignant neoplasm of brain: Secondary | ICD-10-CM | POA: Insufficient documentation

## 2019-07-13 DIAGNOSIS — Z9221 Personal history of antineoplastic chemotherapy: Secondary | ICD-10-CM | POA: Insufficient documentation

## 2019-07-13 DIAGNOSIS — Z86711 Personal history of pulmonary embolism: Secondary | ICD-10-CM | POA: Insufficient documentation

## 2019-07-13 DIAGNOSIS — I252 Old myocardial infarction: Secondary | ICD-10-CM | POA: Insufficient documentation

## 2019-07-13 DIAGNOSIS — Z79899 Other long term (current) drug therapy: Secondary | ICD-10-CM | POA: Diagnosis not present

## 2019-07-13 DIAGNOSIS — Z95828 Presence of other vascular implants and grafts: Secondary | ICD-10-CM

## 2019-07-13 LAB — COMPREHENSIVE METABOLIC PANEL
ALT: 41 U/L (ref 0–44)
AST: 40 U/L (ref 15–41)
Albumin: 4 g/dL (ref 3.5–5.0)
Alkaline Phosphatase: 135 U/L — ABNORMAL HIGH (ref 38–126)
Anion gap: 7 (ref 5–15)
BUN: 17 mg/dL (ref 8–23)
CO2: 26 mmol/L (ref 22–32)
Calcium: 9.1 mg/dL (ref 8.9–10.3)
Chloride: 104 mmol/L (ref 98–111)
Creatinine, Ser: 0.68 mg/dL (ref 0.44–1.00)
GFR calc Af Amer: >60 mL/min (ref >60)
GFR calc non Af Amer: >60 mL/min (ref >60)
Glucose, Bld: 79 mg/dL (ref 70–99)
Potassium: 4 mmol/L (ref 3.5–5.1)
Sodium: 137 mmol/L (ref 135–145)
Total Bilirubin: 0.7 mg/dL (ref 0.3–1.2)
Total Protein: 7.1 g/dL (ref 6.5–8.1)

## 2019-07-13 LAB — CBC WITH DIFFERENTIAL/PLATELET
Abs Immature Granulocytes: 0.01 10*3/uL (ref 0.00–0.07)
Basophils Absolute: 0 10*3/uL (ref 0.0–0.1)
Basophils Relative: 1 %
Eosinophils Absolute: 0.1 10*3/uL (ref 0.0–0.5)
Eosinophils Relative: 2 %
HCT: 39 % (ref 36.0–46.0)
Hemoglobin: 12.8 g/dL (ref 12.0–15.0)
Immature Granulocytes: 0 %
Lymphocytes Relative: 26 %
Lymphs Abs: 0.8 10*3/uL (ref 0.7–4.0)
MCH: 31.3 pg (ref 26.0–34.0)
MCHC: 32.8 g/dL (ref 30.0–36.0)
MCV: 95.4 fL (ref 80.0–100.0)
Monocytes Absolute: 0.4 10*3/uL (ref 0.1–1.0)
Monocytes Relative: 13 %
Neutro Abs: 1.6 10*3/uL — ABNORMAL LOW (ref 1.7–7.7)
Neutrophils Relative %: 58 %
Platelets: 262 10*3/uL (ref 150–400)
RBC: 4.09 MIL/uL (ref 3.87–5.11)
RDW: 16.1 % — ABNORMAL HIGH (ref 11.5–15.5)
WBC: 2.8 10*3/uL — ABNORMAL LOW (ref 4.0–10.5)
nRBC: 0 % (ref 0.0–0.2)

## 2019-07-13 MED ORDER — DEXAMETHASONE 2 MG PO TABS: ORAL_TABLET | ORAL | 0 refills | Status: DC

## 2019-07-13 MED ORDER — HEPARIN SOD (PORK) LOCK FLUSH 100 UNIT/ML IV SOLN
500.0000 [IU] | Freq: Once | INTRAVENOUS | Status: AC
  Administered 2019-07-13: 09:00:00 500 [IU]

## 2019-07-13 NOTE — Progress Notes (Signed)
[place]  [phone] [phone]   Name: Sarah Horne Date: 07/13/2019 MRN: [id number]  DOB: 07/27/1956  Patient Care Team: Arnetha Courser, MD as PCP - General (Family Medicine) Wellington Hampshire, MD as PCP - Cardiology (Cardiology) Byrnett, Forest Gleason, MD (General Surgery) [name], MD (Inactive) as Attending Physician (Internal Medicine)    REASON FOR CONSULTATION: Palliative Care consult requested for this 63 y.o. female with multiple medical problems including stage IV breast cancer with leptomeningeal and liver metastases who has had disease progression on multiple previous lines of treatment.  MRI of the brain on 05/06/2019 shows a several new metastatic lesions.  Patient was referred to palliative care to help address goals and manage ongoing symptoms.  SOCIAL HISTORY:     reports that she has been smoking cigarettes. She has a 9.00 pack-year smoking history. She has never used smokeless tobacco. She reports that she does not drink alcohol or use drugs.   Patient is not married.  She lives at home alone.  She has a son and daughter who live about 30 minutes away.  She has a brother who is involved in her care.  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  ADVANCE DIRECTIVES:  HC POA and living will on file dated 12/12/2016.  Patient's brother is her healthcare power of attorney.  CODE STATUS: DNR/DNI (MOST form completed 07/13/2019)  PAST MEDICAL HISTORY: Past Medical History:  Diagnosis Date   Chemotherapy-induced peripheral neuropathy ([place])    Epistaxis    a. 11/2016 in setting of asa/plavix-->silver nitrate cauterization.   GI bleed    a. 11/2016 Admission w/ GIB and hypovolemic shock req 3u PRBC's;  b. 11/2016 ECG: gastritis & nonbleeding peptic ulcer; c. 11/2016 Conlonoscopy: rectal and sigmoid colonic ulcers.   Heart attack ([place])    a. 1998 Cath @ [facility]: reportedly no intervention  required.   Herceptin-induced cardiomyopathy ([place])    a. In the setting of Herceptin Rx for breast cancer (initiated 12/2014); b. 03/2015 MUGA EF 64%; b. 08/2015 MUGA: EF 51%; c. 10/2015 MUGA: EF 44%; d. 11/2015 Echo: EF 45-50%; e. 01/2016 MUGA: EF 60%; f. 06/2016 MUGA EF 65%; g. 10/2016 MUGA: EF 61%;  h. 12/2016 Echo: EF 55-60%, gr1 DD.   Hyperlipidemia    Hypertension    Neuropathy    Possible PAD (peripheral artery disease) ([place])    a. 11/2016 LE cyanosis and weak pulses-->CTA w/o significant Ao-BiFem dzs. ? distal dzs-->ASA/Plavix initiated by vascular surgery.   PSVT (paroxysmal supraventricular tachycardia) ([place])    a. Dx 11/2016.   Pulmonary embolism ([place])    a. 12/2016 CTA Chest: small nonocclusive PE in inferior segment of the Left lingula, somewhat eccentric filling defect suggesting chronic rather than acute embolic event; b. 05/4708 LE U/S:  No DVT; c. 12/2016 Echo: Nl RV fxn, nl PASP.   Recurrent Metastatic breast cancer ([place])    a. Dx 2016: Stage II, ER positive, PR positive, HER-2/neu overexpressing of the left breast-->chemo/radiation; b. 10/2016 CT Abd/pelvis: diffuse liver mets, ill defined sclerotic bone lesions-T12;  c. 10/2016 MRI brain: metastatic lesion along L temporal lobe (19x48m) w/ extensive surrounding edema & 537mmidline shift to right.   Sinus tachycardia     PAST SURGICAL HISTORY:  Past Surgical History:  Procedure Laterality Date   BREAST BIOPSY Left 2016   Positive   BREAST LUMPECTOMY WITH SENTINEL LYMPH NODE BIOPSY Left 05/23/2015   Procedure: LEFT BREAST WIDE  EXCISION WITH AXILLARY DISSECTION, MASTOPLASTY ;  Surgeon: [name], MD;  Location: [facility] ORS;  Service: General;  Laterality: Left;   BREAST SURGERY Left 12/18/14   breast biopsy/INVASIVE DUCTAL CARCINOMA OF BREAST, NOTTINGHAM GRADE 2.   BREAST SURGERY  05/23/2015.   Wide excision/mastoplasty, axillary dissection. No residual invasive cancer, positive for residual DCIS. 0/2 nodes identified  on axillary dissection. (no SLN by technetium or methylene blue)   CARDIAC CATHETERIZATION     COLONOSCOPY WITH PROPOFOL N/A 12/10/2016   Procedure: COLONOSCOPY WITH PROPOFOL;  Surgeon: Lucilla Lame, MD;  Location: [facility] ENDOSCOPY;  Service: Endoscopy;  Laterality: N/A;   COLONOSCOPY WITH PROPOFOL N/A 02/13/2017   Procedure: COLONOSCOPY WITH PROPOFOL;  Surgeon: Lucilla Lame, MD;  Location: [facility] ENDOSCOPY;  Service: Endoscopy;  Laterality: N/A;   ESOPHAGOGASTRODUODENOSCOPY (EGD) WITH PROPOFOL N/A 12/08/2016   Procedure: ESOPHAGOGASTRODUODENOSCOPY (EGD) WITH PROPOFOL;  Surgeon: Lucilla Lame, MD;  Location: [facility] ENDOSCOPY;  Service: Endoscopy;  Laterality: N/A;   IVC FILTER INSERTION N/A 02/15/2017   Procedure: IVC Filter Insertion;  Surgeon: Algernon Huxley, MD;  Location: [place] [facility];  Service: Cardiovascular;  Laterality: N/A;   PORTACATH PLACEMENT Right 12-31-14   Dr Bary Castilla    HEMATOLOGY/ONCOLOGY HISTORY:  Oncology History Overview Note  # LEFT BREAST IDC; STAGE II [cT2N1] ER >90%; PR- 50-90%; her 2 NEU- POS; s/p Neoadj chemo; AUG 2016- TCH+P s/p Lumpec & partial ALND- path CR; s/p RT [finished Nov 2016];  adj Herceptin; HELD for Jan 19th 2017 [in DEC 2016-EF dropped from 63 to 51%; FEB 27th EF-42.9%]; JAN 2016 START Arimidex; May 2017- EF- 60%; May 24th 2017-Re-start Herceptin q 3W; July 28th STOP herceptin [finished 27m/m2; sec to Low EF; however 2017 Oct EF= 67%; improved]  # DEC 4th 2017- Start Neratinib 4 pills; DEC 11th 3 pills; STOPPED.  # FEB 2018- RECURRENCE ER/PR positive; Her 2 NEGATIVE [liver Bx]  # MARCH 1st 2018- Tax-Cytoxan [poor tol]  # FEB 2018- Brain mets [SBRT; Dr.Crystal; finished Feb 28th 2018]  # March 2018- Eribulin- poor tolerance  # June 8th 2018- Started Faslodex + Abema [abema-multiple interruptions sec to neutropenia]  # MRI Brain- March 2019- Leptomeningeal mets [WBRT; No LP s/p WBRT- 01/09/2018]; STOP Abema [537mday-sec to severe  cytopenia]+faslodex  # MAY 20tth 019- Start Xeloda; STOPPED in Nov 22nd 2019- sec to Progressive liver lesions on CT scan  # NOV 26th 2019- Faslodex + Piqaray 25022m; Jan 2020- Piqray 300 mg+ faslodex; FEB 17th CT scan- mixed response/ Overall progression; STOP Piqray + faslodex.   # FEB 25th 2020- ERIBULIN; Poor tolerance sec to severe neutropenia.  Stopped May 2020.  CT scan May 2020-new/progressive 2 cm liver lesion;   #July nd 2020-Ixempra  #May 06, 2019-MRI brain 5 new lesions [3 mm to 10 mm]-asymptomatic; [previous whole brain radiation]-active surveillance  # PN- G-2; may 2017- Cymbalta 28m72mMARCH 2018-  acute vascular insuff of BIL LE [? Taxotere vasoconstriction on asprin]; # hemorragic shock LGIB [s/p colo; Dr.Wohl]  #  SVT- on flecainide.   # April 2016- Liver Bx- NEG;   # Drop in EF from Herceptin [recovered OCt 27th- EF 65%]  # BMD- jan 2017- osteopenia  -----------------------------------------------------------     [place]th [place]- PDL-1 0%/NEG for BRCA; MSI-Stable; Positive for PI3K; ccnd-1; FGF** -----------------------------------------------------------  Dx: Metastatic Breast cancer [ER/PR-Pos; her -2 NEG] Stage IV; goals: palliative Current treatment-Ixempra   Carcinoma of upper-outer quadrant of left breast in female, estrogen receptor positive (HCC)[place] Lee/25/2020 -  01/17/2019 Chemotherapy   The patient had eriBULin mesylate (HALAVEN) 2.1 mg in sodium chloride 0.9 % 100 mL chemo infusion, 1.2 mg/m2 = 2.1 mg (100 % of original dose 1.2 mg/m2), Intravenous,  Once, 3 of 4 cycles Dose modification: 1.2 mg/m2 (original dose 1.2 mg/m2, Cycle 1, Reason: Provider Judgment), 0.8 mg/m2 (original dose 1.2 mg/m2, Cycle 3, Reason: Provider Judgment) Administration: 2.1 mg (11/22/2018), 2.1 mg (11/29/2018), 2.1 mg (12/20/2018), 1.4 mg (01/17/2019)  for chemotherapy treatment.    03/30/2019 -  Chemotherapy   The patient had pegfilgrastim  (NEULASTA ONPRO KIT) injection 6 mg, 6 mg, Subcutaneous, Once, 5 of 8 cycles Administration: 6 mg (03/30/2019), 6 mg (04/20/2019), 6 mg (05/11/2019), 6 mg (06/22/2019), 6 mg (06/01/2019) ixabepilone (IXEMPRA) 60 mg in lactated ringers 250 mL chemo infusion, 33.5 mg/m2 = 57 mg (100 % of original dose 32 mg/m2), Intravenous,  Once, 5 of 8 cycles Dose modification: 32 mg/m2 (original dose 32 mg/m2, Cycle 1, Reason: Provider Judgment) Administration: 72 mg (04/20/2019), 72 mg (05/11/2019), 72 mg (06/22/2019), 72 mg (06/01/2019)  for chemotherapy treatment.      ALLERGIES:  is allergic to no known allergies.  MEDICATIONS:  Current Outpatient Medications  Medication Sig Dispense Refill   dexamethasone (DECADRON) 2 MG tablet Take 1 pill BID [morning and afternoon] 45 tablet 0   flecainide (TAMBOCOR) 50 MG tablet Take 1 tablet (50 mg total) by mouth 2 (two) times daily. 180 tablet 3   Magnesium Oxide 400 (240 Mg) MG TABS TAKE 1 TABLET BY MOUTH TWICE A DAY 60 tablet 0   pregabalin (LYRICA) 75 MG capsule Take 1 capsule (75 mg total) by mouth 2 (two) times daily. 180 capsule 0   Vitamin D, Ergocalciferol, (DRISDOL) 1.25 MG (50000 UT) CAPS capsule TAKE 1 CAPSULE (50,000 UNITS TOTAL) ONCE A WEEK BY MOUTH. 12 capsule 1   No current facility-administered medications for this visit.    Facility-Administered Medications Ordered in Other Visits  Medication Dose Route Frequency Provider Last Rate Last Dose   0.9 %  sodium chloride infusion   Intravenous Continuous Pandit, Sandeep, MD       0.9 %  sodium chloride infusion   Intravenous Continuous Lloyd Huger, MD 999 mL/hr at 04/24/15 1520     heparin lock flush 100 unit/mL  500 Units Intravenous Once Berenzon, Dmitriy, MD       sodium chloride 0.9 % injection 10 mL  10 mL Intravenous PRN [name], MD   10 mL at 04/04/15 1440   sodium chloride flush (NS) 0.9 % injection 10 mL  10 mL Intravenous PRN Berenzon, Dmitriy, MD        VITAL  SIGNS: There were no vitals taken for this visit. There were no vitals filed for this visit.  Estimated body mass index is 25.5 kg/m as calculated from the following:   Height as of an earlier encounter on 07/13/19: 5' 6"  (1.676 m).   Weight as of an earlier encounter on 07/13/19: 158 lb (71.7 kg).  LABS: CBC:    Component Value Date/Time   WBC 2.8 (L) 07/13/2019 0812   HGB 12.8 07/13/2019 0812   HGB 10.2 (L) 03/04/2017 0918   HCT 39.0 07/13/2019 0812   HCT 33.3 (L) 03/04/2017 0918   PLT 262 07/13/2019 0812   PLT 225 03/04/2017 0918   MCV 95.4 07/13/2019 0812   MCV 89 03/04/2017 0918   MCV 90 01/22/2015 1559   NEUTROABS 1.6 (L) 07/13/2019 0812   NEUTROABS 3.1 03/04/2017 1751  NEUTROABS 34.8 (H) 01/22/2015 1559   LYMPHSABS 0.8 07/13/2019 0812   LYMPHSABS 0.7 03/04/2017 0918   LYMPHSABS 3.4 01/22/2015 1559   MONOABS 0.4 07/13/2019 0812   MONOABS 1.0 (H) 01/22/2015 1559   EOSABS 0.1 07/13/2019 0812   EOSABS 0.1 03/04/2017 0918   EOSABS 0.0 01/22/2015 1559   BASOSABS 0.0 07/13/2019 0812   BASOSABS 0.0 03/04/2017 0918   BASOSABS 0.0 01/22/2015 1559   Comprehensive Metabolic Panel:    Component Value Date/Time   NA 137 07/13/2019 0812   NA 135 03/04/2017 0918   NA 135 01/08/2015 0850   K 4.0 07/13/2019 0812   K 3.7 01/08/2015 0850   CL 104 07/13/2019 0812   CL 101 01/08/2015 0850   CO2 26 07/13/2019 0812   CO2 26 01/08/2015 0850   BUN 17 07/13/2019 0812   BUN 8 03/04/2017 0918   BUN 17 01/08/2015 0850   CREATININE 0.68 07/13/2019 0812   CREATININE 0.82 01/22/2015 1559   GLUCOSE 79 07/13/2019 0812   GLUCOSE 161 (H) 01/08/2015 0850   CALCIUM 9.1 07/13/2019 0812   CALCIUM 9.3 01/08/2015 0850   AST 40 07/13/2019 0812   AST 34 01/22/2015 1559   ALT 41 07/13/2019 0812   ALT 42 01/22/2015 1559   ALKPHOS 135 (H) 07/13/2019 0812   ALKPHOS 157 (H) 01/22/2015 1559   BILITOT 0.7 07/13/2019 0812   BILITOT 6.2 (H) 03/04/2017 0918   BILITOT 0.2 (L) 01/22/2015 1559    PROT 7.1 07/13/2019 0812   PROT 6.4 03/04/2017 0918   PROT 6.8 01/22/2015 1559   ALBUMIN 4.0 07/13/2019 0812   ALBUMIN 2.5 (L) 03/04/2017 0918   ALBUMIN 3.9 01/22/2015 1559    RADIOGRAPHIC STUDIES: Mr Jeri Cos [facility] Contrast  Result Date: 07/06/2019 CLINICAL DATA:  63 year old female with metastatic breast cancer status post whole brain radiation in the spring of 2019. EXAM: MRI HEAD WITHOUT AND WITH CONTRAST TECHNIQUE: Multiplanar, multiecho pulse sequences of the brain and surrounding structures were obtained without and with intravenous contrast. CONTRAST:  14m GADAVIST GADOBUTROL 1 MMOL/ML IV SOLN COMPARISON:  05/09/2019 brain MRI and earlier. FINDINGS: BRAIN New Lesions: There are several new punctate brain metastases: - right cerebellar hemisphere series 18, image 39 (2 millimeters). - right superior cerebellar surface series 18, image 59 (2-3 millimeters). -right thalamic 2 millimeter metastasis on image 91. - and a questionable 1-2 millimeter left cerebellar metastasis on image 58. Larger lesions: All of the previously identified metastases have enlarged by 1 or 2 millimeters since August: - medial left cerebellar metastasis on image 46 today, now 7 millimeters. - medial left occipital lobe metastasis on image 65, now 7-8 millimeters - inferior left temporal metastasis on image 47 (4 millimeters) - right parieto-occipital sulcus metastasis medially on image 91, now 7-8 millimeters. - anterior right frontal lobe superficial cortical and subcortical metastasis now 11 millimeters on image 94. Stable or Smaller lesions: Stable subtle treated left temporal lobe lesion on series 18, image 72 today. Other Brain findings: Mild vasogenic edema associated with the larger metastases. No associated mass effect. Stable FLAIR hyperintensity associated with the treated left temporal lobe lesion on series 15, image 24. Confluent bilateral cerebral white matter T2 and FLAIR hyperintensity compatible with sequelae of  prior whole brain radiation. No superimposed restricted diffusion to suggest acute infarction. No midline shift, ventriculomegaly, extra-axial collection or acute intracranial hemorrhage. Cervicomedullary junction and pituitary are within normal limits. No dural thickening identified. Vascular: Major intracranial vascular flow voids are stable, with evidence of  fetal type bilateral PCA origins. The major dural venous sinuses are enhancing and appear to be patent. Skull and upper cervical spine: Abnormal marrow signal in the C2 vertebra redemonstrated. Calvarium bone marrow signal appears stable and within normal limits. Negative visible cervical spine. Sinuses/Orbits: Negative orbits. Paranasal sinuses are clear. Other: Trace mastoid effusions are stable. Visible internal auditory structures appear normal. Scalp and face soft tissues appear negative. IMPRESSION: 1. Three and possibly four new punctate brain metastases since August, most in the cerebellum. 2. All of the brain metastases identified in August have enlarged by 1-2 mm. Mild edema associated with the larger lesions, no mass effect. 3. Treated left temporal lobe metastasis remain stable. 4. Sequelae of prior whole brain radiation. 5. A total of 8 (vs 9) small enhancing brain metastases, each annotated on series 18. Electronically Signed   By: Genevie Ann M.D.   On: 07/06/2019 17:25   Nm Pet Image Restag (ps) Skull Base To Thigh  Result Date: 06/20/2019 CLINICAL DATA:  Subsequent treatment strategy for metastatic breast cancer with liver metastases. EXAM: NUCLEAR MEDICINE PET SKULL BASE TO THIGH TECHNIQUE: 8.14 mCi F-18 FDG was injected intravenously. Full-ring PET imaging was performed from the skull base to thigh after the radiotracer. CT data was obtained and used for attenuation correction and anatomic localization. Fasting blood glucose: 93 mg/dl COMPARISON:  PET-CT dated 03/01/2019 FINDINGS: Mediastinal blood pool activity: SUV max 2.5 Liver activity:  SUV max NA NECK: No hypermetabolic cervical lymphadenopathy. Incidental CT findings: none CHEST: Postsurgical changes in the upper/outer left breast. No residual hypermetabolism. No suspicious pulmonary nodules. No hypermetabolic thoracic lymphadenopathy. Incidental CT findings: Right chest port terminates at the cavoatrial junction. Mild coronary atherosclerosis of the LAD and left circumflex. ABDOMEN/PELVIS: 2.3 x 2.8 cm lesion in the left hepatic lobe (series 3/image 121), max SUV 4.4, previously 3.5 cm with max SUV 4.3. No hypermetabolic abdominopelvic lymphadenopathy. Incidental CT findings: No abnormal hypermetabolism in the spleen, pancreas, or adrenal glands. IVC filter. Atherosclerotic calcifications the abdominal aorta and branch vessels. Calcified uterine fibroids. SKELETON: No focal hypermetabolic activity to suggest skeletal metastasis. Incidental CT findings: none IMPRESSION: Postsurgical changes in the upper/outer left breast. No residual hypermetabolism. Solitary metastasis in the left hepatic lobe, with stable mild hypermetabolism, although decreased in size on CT. Electronically Signed   By: Julian Hy M.D.   On: 06/20/2019 13:11    PERFORMANCE STATUS (ECOG) : 1 - Symptomatic but completely ambulatory  Review of Systems Unless otherwise noted, a complete review of systems is negative.  Physical Exam General: NAD, frail appearing, thin Pulmonary: Unlabored Extremities: no edema, no joint deformities Skin: no rashes Neurological: Weakness but otherwise nonfocal  IMPRESSION: I met with patient today following her visit with Dr. Rogue Bussing.    Patient reports that she just received bad news from Dr. B.  MRI of the brain from 07/06/2019 reveals 3-4 new brain metastases with all previously identified metastatic lesions having enlarged.  Patient has been referred back to radiation oncology.  Patient says that she wants to remain optimistic and hopeful.  She remains committed to  the idea of pursuing treatment.  However, she does seem to recognize that her long-term prognosis is poor.  I offered to call her family but she declined.  She says that they are aware and she is keeping them updated.  We discussed CODE STATUS.  Patient says that she would not want to be resuscitated or have her life prolonged artificially machines.  She tells me that her  mother was on a ventilator and she would not want that for herself.  I completed a MOST form today. The patient outlined their wishes for the following treatment decisions:  Cardiopulmonary Resuscitation: Do Not Attempt Resuscitation (DNR/No CPR)  Medical Interventions: Limited Additional Interventions: Use medical treatment, IV fluids and cardiac monitoring as indicated, DO NOT USE intubation or mechanical ventilation. May consider use of less invasive airway support such as BiPAP or CPAP. Also provide comfort measures. Transfer to the hospital if indicated. Avoid intensive care.   Antibiotics: Antibiotics if indicated  IV Fluids: IV fluids if indicated  Feeding Tube: No feeding tube     PLAN: -Continue current scope of treatment -MOST Form completed -DNR/DNI -RTC in 3-4 weeks   Patient expressed understanding and was in agreement with this plan. She also understands that She can call the clinic at any time with any questions, concerns, or complaints.     Time Total: 30 minutes  Visit consisted of counseling and education dealing with the complex and emotionally intense issues of symptom management and palliative care in the setting of serious and potentially life-threatening illness.Greater than 50%  of this time was spent counseling and coordinating care related to the above assessment and plan.  Signed by: Altha Harm, PhD, NP-C [phone] (Work Cell)

## 2019-07-13 NOTE — Progress Notes (Signed)
[place] OFFICE PROGRESS NOTE  Patient Care Team: Lada, Satira Anis, MD as PCP - General (Family Medicine) Wellington Hampshire, MD as PCP - Cardiology (Cardiology) Byrnett, Forest Gleason, MD (General Surgery) [name], MD (Inactive) as Attending Physician (Internal Medicine)  Cancer Staging No matching staging information was found for the patient.   Oncology History Overview Note  # LEFT BREAST IDC; STAGE II [cT2N1] ER >90%; PR- 50-90%; her 2 NEU- POS; s/p Neoadj chemo; AUG 2016- TCH+P s/p Lumpec & partial ALND- path CR; s/p RT [finished Nov 2016];  adj Herceptin; HELD for Jan 19th 2017 [in DEC 2016-EF dropped from 63 to 51%; FEB 27th EF-42.9%]; JAN 2016 START Arimidex; May 2017- EF- 60%; May 24th 2017-Re-start Herceptin q 3W; July 28th STOP herceptin [finished 29m/m2; sec to Low EF; however 2017 Oct EF= 67%; improved]  # DEC 4th 2017- Start Neratinib 4 pills; DEC 11th 3 pills; STOPPED.  # FEB 2018- RECURRENCE ER/PR positive; Her 2 NEGATIVE [liver Bx]  # MARCH 1st 2018- Tax-Cytoxan [poor tol]  # FEB 2018- Brain mets [SBRT; Dr.Crystal; finished Feb 28th 2018]  # March 2018- Eribulin- poor tolerance  # June 8th 2018- Started Faslodex + Abema [abema-multiple interruptions sec to neutropenia]  # MRI Brain- March 2019- Leptomeningeal mets [WBRT; No LP s/p WBRT- 01/09/2018]; STOP Abema [572mday-sec to severe cytopenia]+faslodex  # MAY 20tth 019- Start Xeloda; STOPPED in Nov 22nd 2019- sec to Progressive liver lesions on CT scan  # NOV 26th 2019- Faslodex + Piqaray 25047m; Jan 2020- Piqray 300 mg+ faslodex; FEB 17th CT scan- mixed response/ Overall progression; STOP Piqray + faslodex.   # FEB 25th 2020- ERIBULIN; Poor tolerance sec to severe neutropenia.  Stopped May 2020.  CT scan May 2020-new/progressive 2 cm liver lesion;   #July nd 2020-Ixempra  #May 06, 2019-MRI brain 5 new lesions [3 mm to 10 mm]-asymptomatic; [previous whole brain radiation]-active  surveillance  # PN- G-2; may 2017- Cymbalta 73m22mMARCH 2018-  acute vascular insuff of BIL LE [? Taxotere vasoconstriction on asprin]; # hemorragic shock LGIB [s/p colo; Dr.Wohl]  #  SVT- on flecainide.   # April 2016- Liver Bx- NEG;   # Drop in EF from Herceptin [recovered OCt 27th- EF 65%]  # BMD- jan 2017- osteopenia  -----------------------------------------------------------     [place]- Sep 4th 2018- PDL-1 0%/NEG for BRCA; MSI-Stable; Positive for PI3K; ccnd-1; FGF** -----------------------------------------------------------  Dx: Metastatic Breast cancer [ER/PR-Pos; her -2 NEG] Stage IV; goals: palliative Current treatment-Ixempra   Carcinoma of upper-outer quadrant of left breast in female, estrogen receptor positive (HCC)[place]/25/2020 - 01/17/2019 Chemotherapy   The patient had eriBULin mesylate (HALAVEN) 2.1 mg in sodium chloride 0.9 % 100 mL chemo infusion, 1.2 mg/m2 = 2.1 mg (100 % of original dose 1.2 mg/m2), Intravenous,  Once, 3 of 4 cycles Dose modification: 1.2 mg/m2 (original dose 1.2 mg/m2, Cycle 1, Reason: Provider Judgment), 0.8 mg/m2 (original dose 1.2 mg/m2, Cycle 3, Reason: Provider Judgment) Administration: 2.1 mg (11/22/2018), 2.1 mg (11/29/2018), 2.1 mg (12/20/2018), 1.4 mg (01/17/2019)  for chemotherapy treatment.    03/30/2019 -  Chemotherapy   The patient had pegfilgrastim (NEULASTA ONPRO KIT) injection 6 mg, 6 mg, Subcutaneous, Once, 5 of 8 cycles Administration: 6 mg (03/30/2019), 6 mg (04/20/2019), 6 mg (05/11/2019), 6 mg (06/22/2019), 6 mg (06/01/2019) ixabepilone (IXEMPRA) 60 mg in lactated ringers 250 mL chemo infusion, 33.5 mg/m2 = 57 mg (100 % of original dose 32 mg/m2), Intravenous,  Once, 5 of  8 cycles Dose modification: 32 mg/m2 (original dose 32 mg/m2, Cycle 1, Reason: Provider Judgment) Administration: 72 mg (04/20/2019), 72 mg (05/11/2019), 72 mg (06/22/2019), 72 mg (06/01/2019)  for chemotherapy treatment.      INTERVAL  HISTORY:  Sarah Horne 63 y.o.  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  Patient continues to deny any unusual headaches.  Denies vomiting.  Chronic tingling and numbness next few days.  No falls.  Mild fatigue.  Otherwise appetite is good but no weight loss.  Review of Systems  Constitutional: Positive for malaise/fatigue. Negative for chills, diaphoresis, fever and weight loss.  HENT: Negative for nosebleeds and sore throat.   Eyes: Negative for double vision.  Respiratory: Negative for cough, hemoptysis, sputum production, shortness of breath and wheezing.   Cardiovascular: Negative for chest pain, palpitations, orthopnea and leg swelling.  Gastrointestinal: Negative for abdominal pain, blood in stool, constipation, heartburn, melena, nausea and vomiting.  Genitourinary: Negative for dysuria, frequency and urgency.  Musculoskeletal: Positive for joint pain. Negative for back pain.  Skin: Negative for itching.  Neurological: Positive for tingling. Negative for dizziness, focal weakness, weakness and headaches.  Endo/Heme/Allergies: Does not bruise/bleed easily.  Psychiatric/Behavioral: Negative for depression. The patient is not nervous/anxious and does not have insomnia.       PAST MEDICAL HISTORY :  Past Medical History:  Diagnosis Date  . Chemotherapy-induced peripheral neuropathy ([place])   . Epistaxis    a. 11/2016 in setting of asa/plavix-->silver nitrate cauterization.  . GI bleed    a. 11/2016 Admission w/ GIB and hypovolemic shock req 3u PRBC's;  b. 11/2016 ECG: gastritis & nonbleeding peptic ulcer; c. 11/2016 Conlonoscopy: rectal and sigmoid colonic ulcers.  . Heart attack ([place])    a. 1998 Cath @ [facility]: reportedly no intervention required.  Marland Kitchen Herceptin-induced cardiomyopathy ([name])    a. In the setting of  Herceptin Rx for breast cancer (initiated 12/2014); b. 03/2015 MUGA EF 64%; b. 08/2015 MUGA: EF 51%; c. 10/2015 MUGA: EF 44%; d. 11/2015 Echo: EF 45-50%; e. 01/2016 MUGA: EF 60%; f. 06/2016 MUGA EF 65%; g. 10/2016 MUGA: EF 61%;  h. 12/2016 Echo: EF 55-60%, gr1 DD.  Marland Kitchen Hyperlipidemia   . Hypertension   . Neuropathy   . Possible PAD (peripheral artery disease) ([place])    a. 11/2016 LE cyanosis and weak pulses-->CTA w/o significant Ao-BiFem dzs. ? distal dzs-->ASA/Plavix initiated by vascular surgery.  Marland Kitchen PSVT (paroxysmal supraventricular tachycardia) ([place])    a. Dx 11/2016.  . Pulmonary embolism ([place])    a. 12/2016 CTA Chest: small nonocclusive PE in inferior segment of the Left lingula, somewhat eccentric filling defect suggesting chronic rather than acute embolic event; b. 02/9628 LE U/S:  No DVT; c. 12/2016 Echo: Nl RV fxn, nl PASP.  Marland Kitchen Recurrent Metastatic breast cancer ([place])    a. Dx 2016: Stage II, ER positive, PR positive, HER-2/neu overexpressing of the left breast-->chemo/radiation; b. 10/2016 CT Abd/pelvis: diffuse liver mets, ill defined sclerotic bone lesions-T12;  c. 10/2016 MRI brain: metastatic lesion along L temporal lobe (19x36m) w/ extensive surrounding edema & 529mmidline shift to right.  . Sinus tachycardia     PAST SURGICAL HISTORY :   Past Surgical History:  Procedure Laterality Date  . BREAST BIOPSY Left 2016   Positive  . BREAST LUMPECTOMY WITH SENTINEL LYMPH NODE BIOPSY Left 05/23/2015  Procedure: LEFT BREAST WIDE EXCISION WITH AXILLARY DISSECTION, MASTOPLASTY ;  Surgeon: [name], MD;  Location: [facility] ORS;  Service: General;  Laterality: Left;  . BREAST SURGERY Left 12/18/14   breast biopsy/INVASIVE DUCTAL CARCINOMA OF BREAST, NOTTINGHAM GRADE 2.  Marland Kitchen BREAST SURGERY  05/23/2015.   Wide excision/mastoplasty, axillary dissection. No residual invasive cancer, positive for residual DCIS. 0/2 nodes identified on axillary dissection. (no SLN by technetium or methylene blue)  . CARDIAC  CATHETERIZATION    . COLONOSCOPY WITH PROPOFOL N/A 12/10/2016   Procedure: COLONOSCOPY WITH PROPOFOL;  Surgeon: Lucilla Lame, MD;  Location: [facility] ENDOSCOPY;  Service: Endoscopy;  Laterality: N/A;  . COLONOSCOPY WITH PROPOFOL N/A 02/13/2017   Procedure: COLONOSCOPY WITH PROPOFOL;  Surgeon: Lucilla Lame, MD;  Location: [facility] ENDOSCOPY;  Service: Endoscopy;  Laterality: N/A;  . ESOPHAGOGASTRODUODENOSCOPY (EGD) WITH PROPOFOL N/A 12/08/2016   Procedure: ESOPHAGOGASTRODUODENOSCOPY (EGD) WITH PROPOFOL;  Surgeon: Lucilla Lame, MD;  Location: [facility] ENDOSCOPY;  Service: Endoscopy;  Laterality: N/A;  . IVC FILTER INSERTION N/A 02/15/2017   Procedure: IVC Filter Insertion;  Surgeon: Algernon Huxley, MD;  Location: [place] CV LAB;  Service: Cardiovascular;  Laterality: N/A;  . PORTACATH PLACEMENT Right 12-31-14   Dr Bary Castilla    FAMILY HISTORY :   Family History  Problem Relation Age of Onset  . Breast cancer Maternal Aunt   . Breast cancer Cousin   . Brain cancer Maternal Uncle   . Diabetes Mother   . Hypertension Mother   . Stroke Mother     SOCIAL HISTORY:   Social History   Tobacco Use  . Smoking status: Light Tobacco Smoker    Packs/day: 0.50    Years: 18.00    Pack years: 9.00    Types: Cigarettes  . Smokeless tobacco: Never Used  Substance Use Topics  . Alcohol use: No    Alcohol/week: 0.0 standard drinks  . Drug use: No    ALLERGIES:  is allergic to no known allergies.  MEDICATIONS:  Current Outpatient Medications  Medication Sig Dispense Refill  . flecainide (TAMBOCOR) 50 MG tablet Take 1 tablet (50 mg total) by mouth 2 (two) times daily. 180 tablet 3  . Magnesium Oxide 400 (240 Mg) MG TABS TAKE 1 TABLET BY MOUTH TWICE A DAY 60 tablet 0  . pregabalin (LYRICA) 75 MG capsule Take 1 capsule (75 mg total) by mouth 2 (two) times daily. 180 capsule 0  . Vitamin D, Ergocalciferol, (DRISDOL) 1.25 MG (50000 UT) CAPS capsule TAKE 1 CAPSULE (50,000 UNITS TOTAL) ONCE A WEEK BY MOUTH. 12  capsule 1  . dexamethasone (DECADRON) 2 MG tablet Take 1 pill BID [morning and afternoon] 45 tablet 0   No current facility-administered medications for this visit.    Facility-Administered Medications Ordered in Other Visits  Medication Dose Route Frequency Provider Last Rate Last Dose  . 0.9 %  sodium chloride infusion   Intravenous Continuous Pandit, Sandeep, MD      . 0.9 %  sodium chloride infusion   Intravenous Continuous Lloyd Huger, MD 999 mL/hr at 04/24/15 1520    . heparin lock flush 100 unit/mL  500 Units Intravenous Once Berenzon, Dmitriy, MD      . sodium chloride 0.9 % injection 10 mL  10 mL Intravenous PRN [name], MD   10 mL at 04/04/15 1440  . sodium chloride flush (NS) 0.9 % injection 10 mL  10 mL Intravenous PRN Roxana Hires, MD        PHYSICAL EXAMINATION:  ECOG PERFORMANCE STATUS: 1 - Symptomatic but completely ambulatory  BP (!) 135/92   Pulse (!) 112   Temp (!) 96.5 F (35.8 C) (Tympanic)   Resp 20   Ht _0  (1.676 m)   Wt 158 lb (71.7 kg)   BMI 25.50 kg/m   Filed Weights   07/13/19 0836  Weight: 158 lb (71.7 kg)    Physical Exam  Constitutional: She is oriented to person, place, and time and well-developed, well-nourished, and in no distress.  Alone.  Walking herself.  HENT:  Head: Normocephalic and atraumatic.  Mouth/Throat: Oropharynx is clear and moist. No oropharyngeal exudate.  Eyes: Pupils are equal, round, and reactive to light.  Neck: Normal range of motion. Neck supple.  Cardiovascular: Normal rate and regular rhythm.  Pulmonary/Chest: No respiratory distress. She has no wheezes.  Abdominal: Soft. Bowel sounds are normal. She exhibits no distension and no mass. There is no abdominal tenderness. There is no rebound and no guarding.  Musculoskeletal: Normal range of motion.        General: No tenderness or edema.  Neurological: She is alert and oriented to person, place, and time.  Skin: Skin is warm.  Psychiatric:  Affect normal.    LABORATORY DATA:  I have reviewed the data as listed    Component Value Date/Time   NA 137 07/13/2019 0812   NA 135 03/04/2017 0918   NA 135 01/08/2015 0850   K 4.0 07/13/2019 0812   K 3.7 01/08/2015 0850   CL 104 07/13/2019 0812   CL 101 01/08/2015 0850   CO2 26 07/13/2019 0812   CO2 26 01/08/2015 0850   GLUCOSE 79 07/13/2019 0812   GLUCOSE 161 (H) 01/08/2015 0850   BUN 17 07/13/2019 0812   BUN 8 03/04/2017 0918   BUN 17 01/08/2015 0850   CREATININE 0.68 07/13/2019 0812   CREATININE 0.82 01/22/2015 1559   CALCIUM 9.1 07/13/2019 0812   CALCIUM 9.3 01/08/2015 0850   PROT 7.1 07/13/2019 0812   PROT 6.4 03/04/2017 0918   PROT 6.8 01/22/2015 1559   ALBUMIN 4.0 07/13/2019 0812   ALBUMIN 2.5 (L) 03/04/2017 0918   ALBUMIN 3.9 01/22/2015 1559   AST 40 07/13/2019 0812   AST 34 01/22/2015 1559   ALT 41 07/13/2019 0812   ALT 42 01/22/2015 1559   ALKPHOS 135 (H) 07/13/2019 0812   ALKPHOS 157 (H) 01/22/2015 1559   BILITOT 0.7 07/13/2019 0812   BILITOT 6.2 (H) 03/04/2017 0918   BILITOT 0.2 (L) 01/22/2015 1559   GFRNONAA >60 07/13/2019 0812   GFRNONAA >60 01/22/2015 1559   GFRAA >60 07/13/2019 0812   GFRAA >60 01/22/2015 1559    No results found for: SPEP, UPEP  Lab Results  Component Value Date   WBC 2.8 (L) 07/13/2019   NEUTROABS 1.6 (L) 07/13/2019   HGB 12.8 07/13/2019   HCT 39.0 07/13/2019   MCV 95.4 07/13/2019   PLT 262 07/13/2019      Chemistry      Component Value Date/Time   NA 137 07/13/2019 0812   NA 135 03/04/2017 0918   NA 135 01/08/2015 0850   K 4.0 07/13/2019 0812   K 3.7 01/08/2015 0850   CL 104 07/13/2019 0812   CL 101 01/08/2015 0850   CO2 26 07/13/2019 0812   CO2 26 01/08/2015 0850   BUN 17 07/13/2019 0812   BUN 8 03/04/2017 0918   BUN 17 01/08/2015 0850   CREATININE 0.68 07/13/2019 0812   CREATININE 0.82 01/22/2015  1559      Component Value Date/Time   CALCIUM 9.1 07/13/2019 0812   CALCIUM 9.3 01/08/2015 0850    ALKPHOS 135 (H) 07/13/2019 0812   ALKPHOS 157 (H) 01/22/2015 1559   AST 40 07/13/2019 0812   AST 34 01/22/2015 1559   ALT 41 07/13/2019 0812   ALT 42 01/22/2015 1559   BILITOT 0.7 07/13/2019 0812   BILITOT 6.2 (H) 03/04/2017 0918   BILITOT 0.2 (L) 01/22/2015 1559       RADIOGRAPHIC STUDIES: I have personally reviewed the radiological images as listed and agreed with the findings in the report. No results found.   ASSESSMENT & PLAN:  Carcinoma of upper-outer quadrant of left breast in female, estrogen receptor positive ([place]) # Recurrent metastatic breast cancer-ER PR positive HER-2 negative; on Ixempra s/p 4 cycles- SEP 22nd PET scan shows-Partial response with shrinkage solitary left hepatic lobe lesion; of the no evidence of any disease uptake anywhere else.   May 06, 2019 MRI shows at least 5 new lesions [largest 10 mm]-see below.  # HOLD chemo today; given worsening brain mets [see below]  #Elevated alkaline phosphatase-likely second malignancy. 134- stable.    # Brain metastases-recurrent/hx leptomeningeal disease;  S/p WBRT [April 2019].  OCT 9th 2020 MRI brain shows at least 8-9 lesions  lesions-3 mm to 10 mm; Asymptomatic. Will refer to Radiation for re-consideration of WBRT. Discussed with Dr.Crystal. start dex 2 mg BID.   #Peripheral neuropathy grade 1-  on Lyrica; STABLE.  # # I offered to call the patient's family to give an update on clinical status; patient declines.   # DISPOSITION: print copy of MRI # referral to Dr.Crystal ASAP-brain metastases # HOLD chemo today.; de-access # Follow up in 3 weeks; MD; cbc/cmp-; no chemo Dr.B     Orders Placed This Encounter  Procedures  . CBC with Differential    Standing Status:   Future    Standing Expiration Date:   07/12/2020  . Comprehensive metabolic panel    Standing Status:   Future    Standing Expiration Date:   07/12/2020   All questions were answered. The patient knows to call the clinic with any problems,  questions or concerns.      Cammie Sickle, MD 07/14/2019 6:52 AM

## 2019-07-13 NOTE — Assessment & Plan Note (Addendum)
#  Recurrent metastatic breast cancer-ER PR positive HER-2 negative; on Ixempra s/p 4 cycles- SEP 22nd PET scan shows-Partial response with shrinkage solitary left hepatic lobe lesion; of the no evidence of any disease uptake anywhere else.   May 06, 2019 MRI shows at least 5 new lesions [largest 10 mm]-see below.  # HOLD chemo today; given worsening brain mets [see below]  #Elevated alkaline phosphatase-likely second malignancy. 134- stable.    # Brain metastases-recurrent/hx leptomeningeal disease;  S/p WBRT [April 2019].  OCT 9th 2020 MRI brain shows at least 8-9 lesions  lesions-3 mm to 10 mm; Asymptomatic. Will refer to Radiation for re-consideration of WBRT. Discussed with Dr.Crystal. start dex 2 mg BID.   #Peripheral neuropathy grade 1-  on Lyrica; STABLE.  # # I offered to call the patient's family to give an update on clinical status; patient declines.   # DISPOSITION: print copy of MRI # referral to Dr.Crystal ASAP-brain metastases # HOLD chemo today.; de-access # Follow up in 3 weeks; MD; cbc/cmp-; no chemo Dr.B

## 2019-07-14 LAB — CANCER ANTIGEN 15-3: CA 15-3: 23.9 U/mL (ref 0.0–25.0)

## 2019-07-14 LAB — CANCER ANTIGEN 27.29: CA 27.29: 32.2 U/mL (ref 0.0–38.6)

## 2019-07-18 ENCOUNTER — Other Ambulatory Visit: Payer: Self-pay

## 2019-07-19 ENCOUNTER — Other Ambulatory Visit: Payer: Self-pay

## 2019-07-19 ENCOUNTER — Encounter: Payer: Self-pay | Admitting: Radiation Oncology

## 2019-07-19 ENCOUNTER — Ambulatory Visit
Admission: RE | Admit: 2019-07-19 | Discharge: 2019-07-19 | Disposition: A | Discharge status: Home / Self Care | Payer: Medicare HMO | Source: Ambulatory Visit | Attending: Radiation Oncology | Admitting: Radiation Oncology

## 2019-07-19 VITALS — BP 142/84 | HR 100 | Temp 96.3°F | Resp 16 | Wt 161.8 lb

## 2019-07-19 DIAGNOSIS — C7931 Secondary malignant neoplasm of brain: Secondary | ICD-10-CM | POA: Insufficient documentation

## 2019-07-19 DIAGNOSIS — C773 Secondary and unspecified malignant neoplasm of axilla and upper limb lymph nodes: Secondary | ICD-10-CM | POA: Diagnosis not present

## 2019-07-19 DIAGNOSIS — C787 Secondary malignant neoplasm of liver and intrahepatic bile duct: Secondary | ICD-10-CM | POA: Diagnosis not present

## 2019-07-19 DIAGNOSIS — C50412 Malignant neoplasm of upper-outer quadrant of left female breast: Secondary | ICD-10-CM | POA: Insufficient documentation

## 2019-07-19 DIAGNOSIS — Z170 Estrogen receptor positive status [ER+]: Secondary | ICD-10-CM | POA: Insufficient documentation

## 2019-07-19 NOTE — Progress Notes (Signed)
Radiation Oncology Follow up Note old patient new area recurrent brain metastasis  Name: Sarah Horne   Date:   07/19/2019 MRN:  [id number] DOB: [birth year]    This 63 y.o. female presents to the clinic today for reevaluation of recurrent brain metastasis status post whole brain radiation therapy back in April 2019 for triple positive stage IV breast cancer.  REFERRING PROVIDER: Arnetha Courser, MD  HPI: Patient is a 63 year old female well-known to our department having been treated out over a year back for whole brain radiation therapy secondary metastasis from stage IV triple positive invasive mammary carcinoma she also has known liver metastasis.  We initially treated partial brain radiation 2 years prior she developed multiple brain metastasis went on to treat whole brain..  She has been on lxempra for 4 cycles.  Recent imaging shows solitary metastasis in left pedicle lobe with stable mild hypermetabolic activity.  Unfortunately recent MRI scan shows multiple punctate brain metastasis which are enlarging.  There is also mild edema noted.  There are at least 8-9 enhancing brain metastasis.  Promote neurologic standpoint she is doing well specifically denies any change in visual fields any focal neurologic deficits.  Her chemotherapy is on hold she is now referred to radiation oncology for salvage radiation therapy to her brain.  COMPLICATIONS OF TREATMENT: none  FOLLOW UP COMPLIANCE: keeps appointments   PHYSICAL EXAM:  BP (!) 142/84 (BP Location: Left Arm, Patient Position: Sitting)   Pulse 100   Temp (!) 96.3 F (35.7 C) (Tympanic)   Resp 16   Wt 161 lb 12.8 oz (73.4 kg)   BMI 26.12 kg/m  Well-developed well-nourished patient in NAD. HEENT reveals PERLA, EOMI, discs not visualized.  Oral cavity is clear. No oral mucosal lesions are identified. Neck is clear without evidence of cervical or supraclavicular adenopathy. Lungs are clear to A&P. Cardiac examination is essentially  unremarkable with regular rate and rhythm without murmur rub or thrill. Abdomen is benign with no organomegaly or masses noted. Motor sensory and DTR levels are equal and symmetric in the upper and lower extremities. Cranial nerves II through XII are grossly intact. Proprioception is intact. No peripheral adenopathy or edema is identified. No motor or sensory levels are noted. Crude visual fields are within normal range.  RADIOLOGY RESULTS: PET scan and MRI scan of the brain are reviewed compatible with above-stated findings  PLAN: Present time elected go ahead with whole brain radiation  Therapy.  I will plan on delivering 3000 cGy on a slight hypofractionated field at 250 cGy per fraction based on the previous radiation.  She has had little cognitive the decline since her prior whole brain radiation.  Risks and benefits of treatment including hair loss fatigue scalp reaction as well as possible cognitive decline all were discussed in detail with the patient.  I personally set up and ordered CT simulation.  Patient comprehends my treatment plan well.  I would like to take this opportunity to thank you for allowing me to participate in the care of your patient.Noreene Filbert, MD

## 2019-07-20 ENCOUNTER — Telehealth (INDEPENDENT_AMBULATORY_CARE_PROVIDER_SITE_OTHER): Payer: Medicare HMO | Admitting: Cardiovascular Disease

## 2019-07-20 ENCOUNTER — Encounter: Payer: Self-pay | Admitting: Cardiovascular Disease

## 2019-07-20 VITALS — BP 118/80 | HR 89 | Ht 66.0 in | Wt 162.0 lb

## 2019-07-20 DIAGNOSIS — I427 Cardiomyopathy due to drug and external agent: Secondary | ICD-10-CM

## 2019-07-20 DIAGNOSIS — I471 Supraventricular tachycardia: Secondary | ICD-10-CM

## 2019-07-20 NOTE — Progress Notes (Signed)
Virtual Visit via Telephone Note   This visit type was conducted due to national recommendations for restrictions regarding the COVID-19 Pandemic (e.g. social distancing) in an effort to limit this patient's exposure and mitigate transmission in our community.  Due to her co-morbid illnesses, this patient is at least at moderate risk for complications without adequate follow up.  This format is felt to be most appropriate for this patient at this time.  The patient did not have access to video technology/had technical difficulties with video requiring transitioning to audio format only (telephone).  All issues noted in this document were discussed and addressed.  No physical exam could be performed with this format.  Please refer to the patient's chart for her  consent to telehealth for [facility].   Date:  07/20/2019   ID:  Sarah Horne, DOB 06-Aug-1956, MRN [id number]  Patient Location: Home Provider Location: Office  PCP:  Arnetha Courser, MD  Cardiologist:  Kathlyn Sacramento, MD  Electrophysiologist:  None   Evaluation Performed:  Follow-Up Visit  Chief Complaint:  Follow-up visit.  History of Present Illness:    Sarah Horne is a 63 y.o. female was reached via phone for a follow-up visit regarding Herceptin-induced cardiomyopathy and paroxysmal supraventricular tachycardia. The patient reports possible prior myocardial infarction in 1998 where she underwent cardiac catheterization at [facility]. She did not require revascularization and she was treated medically. The details of that admission are not available. She has chronic medical conditions that include hypertension, hyperlipidemia and tobacco use .   She was diagnosed with metastatic breast cancer, with metastisis to liver and brain in 2016 for which she is undergoing chemotherapy. She developed chemo-induced cardiomyopathy related to past treatment of breast cancer with Herceptin in 2017. Her EF was reduced at low as 45% during  treatment, however subsequent echos showed improvement of EF to baseline after treatment. Latest echo in 12/2016 showed and EF of 55-60% with Grade 1 DD. In March of 2018 she developed PSVT. Was initially treated with beta blocker, flecainide and replacement of K+ and Mag, but BB had to be discontinued 2/2 hypotension. In October of 2018, flecainide was attempted to be stopped but she experienced a return of her tachycardia into the 140's and flecainide was resumed .  Unfortunately, recent brain MRI showed new brain mets and she needs to undergo more radiation therapy.  She remains optimistic and in good spirits.  She has not had any significant tachycardia.  She takes flecainide regularly.  No chest pain or shortness of breath.   The patient does not have symptoms concerning for COVID-19 infection (fever, chills, cough, or new shortness of breath).    Past Medical History:  Diagnosis Date  . Chemotherapy-induced peripheral neuropathy ([place])   . Epistaxis    a. 11/2016 in setting of asa/plavix-->silver nitrate cauterization.  . GI bleed    a. 11/2016 Admission w/ GIB and hypovolemic shock req 3u PRBC's;  b. 11/2016 ECG: gastritis & nonbleeding peptic ulcer; c. 11/2016 Conlonoscopy: rectal and sigmoid colonic ulcers.  . Heart attack ([place])    a. 1998 Cath @ [facility]: reportedly no intervention required.  Marland Kitchen Herceptin-induced cardiomyopathy ([place])    a. In the setting of Herceptin Rx for breast cancer (initiated 12/2014); b. 03/2015 MUGA EF 64%; b. 08/2015 MUGA: EF 51%; c. 10/2015 MUGA: EF 44%; d. 11/2015 Echo: EF 45-50%; e. 01/2016 MUGA: EF 60%; f. 06/2016 MUGA EF 65%; g. 10/2016 MUGA: EF 61%;  h. 12/2016 Echo: EF 55-60%,  gr1 DD.  Marland Kitchen Hyperlipidemia   . Hypertension   . Neuropathy   . Possible PAD (peripheral artery disease) ([place])    a. 11/2016 LE cyanosis and weak pulses-->CTA w/o significant Ao-BiFem dzs. ? distal dzs-->ASA/Plavix initiated by vascular surgery.  Marland Kitchen PSVT (paroxysmal supraventricular tachycardia)  ([place])    a. Dx 11/2016.  . Pulmonary embolism ([place])    a. 12/2016 CTA Chest: small nonocclusive PE in inferior segment of the Left lingula, somewhat eccentric filling defect suggesting chronic rather than acute embolic event; b. 10/1192 LE U/S:  No DVT; c. 12/2016 Echo: Nl RV fxn, nl PASP.  Marland Kitchen Recurrent Metastatic breast cancer ([place])    a. Dx 2016: Stage II, ER positive, PR positive, HER-2/neu overexpressing of the left breast-->chemo/radiation; b. 10/2016 CT Abd/pelvis: diffuse liver mets, ill defined sclerotic bone lesions-T12;  c. 10/2016 MRI brain: metastatic lesion along L temporal lobe (19x61m) w/ extensive surrounding edema & 571mmidline shift to right.  . Sinus tachycardia    Past Surgical History:  Procedure Laterality Date  . BREAST BIOPSY Left 2016   Positive  . BREAST LUMPECTOMY WITH SENTINEL LYMPH NODE BIOPSY Left 05/23/2015   Procedure: LEFT BREAST WIDE EXCISION WITH AXILLARY DISSECTION, MASTOPLASTY ;  Surgeon: JeRobert BellowMD;  Location: [facility] ORS;  Service: General;  Laterality: Left;  . BREAST SURGERY Left 12/18/14   breast biopsy/INVASIVE DUCTAL CARCINOMA OF BREAST, NOTTINGHAM GRADE 2.  . Marland KitchenREAST SURGERY  05/23/2015.   Wide excision/mastoplasty, axillary dissection. No residual invasive cancer, positive for residual DCIS. 0/2 nodes identified on axillary dissection. (no SLN by technetium or methylene blue)  . CARDIAC CATHETERIZATION    . COLONOSCOPY WITH PROPOFOL N/A 12/10/2016   Procedure: COLONOSCOPY WITH PROPOFOL;  Surgeon: DaLucilla LameMD;  Location: [facility] ENDOSCOPY;  Service: Endoscopy;  Laterality: N/A;  . COLONOSCOPY WITH PROPOFOL N/A 02/13/2017   Procedure: COLONOSCOPY WITH PROPOFOL;  Surgeon: WoLucilla LameMD;  [facility];  Service: Endoscopy;  Laterality: N/A;  . ESOPHAGOGASTRODUODENOSCOPY (EGD) WITH PROPOFOL N/A 12/08/2016   Procedure: ESOPHAGOGASTRODUODENOSCOPY (EGD) WITH PROPOFOL;  Surgeon: DaLucilla LameMD;  Location: [facility] ENDOSCOPY;  Service: Endoscopy;   Laterality: N/A;  . IVC FILTER INSERTION N/A 02/15/2017   Procedure: IVC Filter Insertion;  Surgeon: DeAlgernon HuxleyMD;  [facility];  Service: Cardiovascular;  Laterality: N/A;  . PORTACATH PLACEMENT Right 12-31-14   Dr ByBary Castilla   Current Meds  Medication Sig  . dexamethasone (DECADRON) 2 MG tablet Take 1 pill BID [morning and afternoon]  . flecainide (TAMBOCOR) 50 MG tablet Take 1 tablet (50 mg total) by mouth 2 (two) times daily.  . Magnesium Oxide 400 (240 Mg) MG TABS TAKE 1 TABLET BY MOUTH TWICE A DAY  . pregabalin (LYRICA) 75 MG capsule Take 1 capsule (75 mg total) by mouth 2 (two) times daily.  . Vitamin D, Ergocalciferol, (DRISDOL) 1.25 MG (50000 UT) CAPS capsule TAKE 1 CAPSULE (50,000 UNITS TOTAL) ONCE A WEEK BY MOUTH.     Allergies:   No known allergies   Social History   Tobacco Use  . Smoking status: Light Tobacco Smoker    Packs/day: 0.50    Years: 18.00    Pack years: 9.00    Types: Cigarettes  . Smokeless tobacco: Never Used  Substance Use Topics  . Alcohol use: No    Alcohol/week: 0.0 standard drinks  . Drug use: No     Family Hx: The patient's family history includes Brain cancer in her maternal  uncle; Breast cancer in her cousin and maternal aunt; Diabetes in her mother; Hypertension in her mother; Stroke in her mother.  ROS:   Please see the history of present illness.     All other systems reviewed and are negative.   Prior CV studies:   The following studies were reviewed today:    Labs/Other Tests and Data Reviewed:    EKG:  No ECG reviewed.  Recent Labs: 07/13/2019: ALT 41; BUN 17; Creatinine, Ser 0.68; Hemoglobin 12.8; Platelets 262; Potassium 4.0; Sodium 137   Recent Lipid Panel Lab Results  Component Value Date/Time   CHOL 136 01/18/2017 09:57 AM   TRIG 127 01/18/2017 09:57 AM   HDL 19 (L) 01/18/2017 09:57 AM   CHOLHDL 7.2 (H) 01/18/2017 09:57 AM   LDLCALC 92 01/18/2017 09:57 AM    Wt Readings from Last 3  Encounters:  07/20/19 162 lb (73.5 kg)  07/19/19 161 lb 12.8 oz (73.4 kg)  07/13/19 158 lb (71.7 kg)     Objective:    Vital Signs:  BP 118/80   Pulse 89   Ht 5' 6"  (1.676 m)   Wt 162 lb (73.5 kg)   BMI 26.15 kg/m    VITAL SIGNS:  reviewed  ASSESSMENT & PLAN:    1.  Paroxysmal supraventricular tachycardia: No recurrent arrhythmia on current dose of flecainide 50 mg twice daily.  2. Chemotherapy induced cardiomyopathy:   This was due to Herceptin with subsequent improvement in LV systolic function to normal.  3. Essential hypertension: she is no longer having episodes of hypotension and she is currently not on any antihypertensive medications.  4.  Metastatic breast cancer, followed closely by oncology.  The plan is to start radiation therapy given new brain mets.   Time:   Today, I have spent 5 minutes with the patient with telehealth technology discussing the above problems.     Medication Adjustments/Labs and Tests Ordered: Current medicines are reviewed at length with the patient today.  Concerns regarding medicines are outlined above.   Tests Ordered: No orders of the defined types were placed in this encounter.   Medication Changes: No orders of the defined types were placed in this encounter.   Follow Up:  In Person in 6 month(s)  Signed, Kathlyn Sacramento, MD  07/20/2019 8:43 AM    [place]

## 2019-07-20 NOTE — Patient Instructions (Signed)
Medication Instructions:  No change in medications *If you need a refill on your cardiac medications before your next appointment, please call your pharmacy*  Lab Work: None If you have labs (blood work) drawn today and your tests are completely normal, you will receive your results only by: Marland Kitchen MyChart Message (if you have MyChart) OR . A paper copy in the mail If you have any lab test that is abnormal or we need to change your treatment, we will call you to review the results.  Testing/Procedures: None  Follow-Up: At [facility], you and your health needs are our priority.  As part of our continuing mission to provide you with exceptional heart care, we have created designated Provider Care Teams.  These Care Teams include your primary Cardiologist (physician) and Advanced Practice Providers (APPs -  Physician Assistants and Nurse Practitioners) who all work together to provide you with the care you need, when you need it.  Your next appointment:   6 months  The format for your next appointment:   In Person  Provider:    You may see Kathlyn Sacramento, MD or one of the following Advanced Practice Providers on your designated Care Team:    Murray Hodgkins, NP  Christell Faith, PA-C  Marrianne Mood, PA-C

## 2019-07-21 ENCOUNTER — Other Ambulatory Visit: Payer: Self-pay

## 2019-07-24 ENCOUNTER — Ambulatory Visit
Admission: RE | Admit: 2019-07-24 | Discharge: 2019-07-24 | Disposition: A | Discharge status: Home / Self Care | Payer: Medicare HMO | Source: Ambulatory Visit | Attending: Radiation Oncology | Admitting: Radiation Oncology

## 2019-07-24 ENCOUNTER — Other Ambulatory Visit: Payer: Self-pay

## 2019-07-24 DIAGNOSIS — Z170 Estrogen receptor positive status [ER+]: Secondary | ICD-10-CM | POA: Diagnosis not present

## 2019-07-24 DIAGNOSIS — C7931 Secondary malignant neoplasm of brain: Secondary | ICD-10-CM | POA: Diagnosis not present

## 2019-07-24 DIAGNOSIS — C50412 Malignant neoplasm of upper-outer quadrant of left female breast: Secondary | ICD-10-CM | POA: Insufficient documentation

## 2019-07-24 DIAGNOSIS — C773 Secondary and unspecified malignant neoplasm of axilla and upper limb lymph nodes: Secondary | ICD-10-CM | POA: Diagnosis not present

## 2019-07-27 ENCOUNTER — Ambulatory Visit
Admission: RE | Admit: 2019-07-27 | Discharge: 2019-07-27 | Disposition: A | Discharge status: Home / Self Care | Payer: Medicare HMO | Source: Ambulatory Visit | Attending: Radiation Oncology | Admitting: Radiation Oncology

## 2019-07-27 ENCOUNTER — Other Ambulatory Visit: Payer: Self-pay

## 2019-07-27 DIAGNOSIS — Z170 Estrogen receptor positive status [ER+]: Secondary | ICD-10-CM | POA: Diagnosis not present

## 2019-07-27 DIAGNOSIS — C50412 Malignant neoplasm of upper-outer quadrant of left female breast: Secondary | ICD-10-CM | POA: Diagnosis not present

## 2019-07-27 DIAGNOSIS — C773 Secondary and unspecified malignant neoplasm of axilla and upper limb lymph nodes: Secondary | ICD-10-CM | POA: Diagnosis not present

## 2019-07-27 DIAGNOSIS — C7931 Secondary malignant neoplasm of brain: Secondary | ICD-10-CM | POA: Diagnosis not present

## 2019-07-31 ENCOUNTER — Telehealth: Payer: Self-pay | Admitting: *Deleted

## 2019-07-31 ENCOUNTER — Ambulatory Visit
Admission: RE | Admit: 2019-07-31 | Discharge: 2019-07-31 | Disposition: A | Discharge status: Home / Self Care | Payer: Medicare HMO | Source: Ambulatory Visit | Attending: Radiation Oncology | Admitting: Radiation Oncology

## 2019-07-31 ENCOUNTER — Other Ambulatory Visit: Payer: Self-pay

## 2019-07-31 DIAGNOSIS — C7931 Secondary malignant neoplasm of brain: Secondary | ICD-10-CM | POA: Diagnosis not present

## 2019-07-31 DIAGNOSIS — Z170 Estrogen receptor positive status [ER+]: Secondary | ICD-10-CM | POA: Insufficient documentation

## 2019-07-31 DIAGNOSIS — C50412 Malignant neoplasm of upper-outer quadrant of left female breast: Secondary | ICD-10-CM | POA: Diagnosis not present

## 2019-07-31 DIAGNOSIS — C773 Secondary and unspecified malignant neoplasm of axilla and upper limb lymph nodes: Secondary | ICD-10-CM | POA: Diagnosis not present

## 2019-07-31 NOTE — Telephone Encounter (Signed)
Attempted to return call to patient regarding questions about forms, left vm for patient to return call.

## 2019-08-01 ENCOUNTER — Ambulatory Visit
Admission: RE | Admit: 2019-08-01 | Discharge: 2019-08-01 | Disposition: A | Discharge status: Home / Self Care | Payer: Medicare HMO | Source: Ambulatory Visit | Attending: Radiation Oncology | Admitting: Radiation Oncology

## 2019-08-01 ENCOUNTER — Other Ambulatory Visit: Payer: Self-pay

## 2019-08-01 DIAGNOSIS — Z170 Estrogen receptor positive status [ER+]: Secondary | ICD-10-CM | POA: Diagnosis not present

## 2019-08-01 DIAGNOSIS — C50412 Malignant neoplasm of upper-outer quadrant of left female breast: Secondary | ICD-10-CM | POA: Diagnosis not present

## 2019-08-01 DIAGNOSIS — C7931 Secondary malignant neoplasm of brain: Secondary | ICD-10-CM | POA: Diagnosis not present

## 2019-08-02 ENCOUNTER — Ambulatory Visit
Admission: RE | Admit: 2019-08-02 | Discharge: 2019-08-02 | Disposition: A | Discharge status: Home / Self Care | Payer: Medicare HMO | Source: Ambulatory Visit | Attending: Radiation Oncology | Admitting: Radiation Oncology

## 2019-08-02 ENCOUNTER — Other Ambulatory Visit: Payer: Self-pay

## 2019-08-02 DIAGNOSIS — C7931 Secondary malignant neoplasm of brain: Secondary | ICD-10-CM | POA: Diagnosis not present

## 2019-08-02 DIAGNOSIS — C50412 Malignant neoplasm of upper-outer quadrant of left female breast: Secondary | ICD-10-CM | POA: Diagnosis not present

## 2019-08-02 DIAGNOSIS — Z170 Estrogen receptor positive status [ER+]: Secondary | ICD-10-CM | POA: Diagnosis not present

## 2019-08-03 ENCOUNTER — Inpatient Hospital Stay (HOSPITAL_BASED_OUTPATIENT_CLINIC_OR_DEPARTMENT_OTHER): Payer: Medicare HMO | Admitting: Hospice and Palliative Medicine

## 2019-08-03 ENCOUNTER — Inpatient Hospital Stay (HOSPITAL_BASED_OUTPATIENT_CLINIC_OR_DEPARTMENT_OTHER): Payer: Medicare HMO | Admitting: Internal Medicine

## 2019-08-03 ENCOUNTER — Other Ambulatory Visit: Payer: Self-pay | Admitting: *Deleted

## 2019-08-03 ENCOUNTER — Ambulatory Visit: Payer: Medicare HMO

## 2019-08-03 ENCOUNTER — Ambulatory Visit
Admission: RE | Admit: 2019-08-03 | Discharge: 2019-08-03 | Disposition: A | Discharge status: Home / Self Care | Payer: Medicare HMO | Source: Ambulatory Visit | Attending: Radiation Oncology | Admitting: Radiation Oncology

## 2019-08-03 ENCOUNTER — Inpatient Hospital Stay: Payer: Medicare HMO | Attending: Internal Medicine

## 2019-08-03 ENCOUNTER — Other Ambulatory Visit: Payer: Self-pay

## 2019-08-03 VITALS — BP 132/77 | HR 84 | Temp 98.2°F | Resp 16 | Wt 163.8 lb

## 2019-08-03 DIAGNOSIS — I10 Essential (primary) hypertension: Secondary | ICD-10-CM | POA: Diagnosis not present

## 2019-08-03 DIAGNOSIS — C7931 Secondary malignant neoplasm of brain: Secondary | ICD-10-CM | POA: Diagnosis not present

## 2019-08-03 DIAGNOSIS — C50412 Malignant neoplasm of upper-outer quadrant of left female breast: Secondary | ICD-10-CM

## 2019-08-03 DIAGNOSIS — Z515 Encounter for palliative care: Secondary | ICD-10-CM | POA: Insufficient documentation

## 2019-08-03 DIAGNOSIS — Z923 Personal history of irradiation: Secondary | ICD-10-CM | POA: Diagnosis not present

## 2019-08-03 DIAGNOSIS — R7989 Other specified abnormal findings of blood chemistry: Secondary | ICD-10-CM | POA: Insufficient documentation

## 2019-08-03 DIAGNOSIS — C787 Secondary malignant neoplasm of liver and intrahepatic bile duct: Secondary | ICD-10-CM | POA: Insufficient documentation

## 2019-08-03 DIAGNOSIS — F17210 Nicotine dependence, cigarettes, uncomplicated: Secondary | ICD-10-CM | POA: Diagnosis not present

## 2019-08-03 DIAGNOSIS — G629 Polyneuropathy, unspecified: Secondary | ICD-10-CM | POA: Diagnosis not present

## 2019-08-03 DIAGNOSIS — R5381 Other malaise: Secondary | ICD-10-CM | POA: Diagnosis not present

## 2019-08-03 DIAGNOSIS — Z79899 Other long term (current) drug therapy: Secondary | ICD-10-CM | POA: Insufficient documentation

## 2019-08-03 DIAGNOSIS — R748 Abnormal levels of other serum enzymes: Secondary | ICD-10-CM

## 2019-08-03 DIAGNOSIS — R5383 Other fatigue: Secondary | ICD-10-CM | POA: Diagnosis not present

## 2019-08-03 DIAGNOSIS — Z170 Estrogen receptor positive status [ER+]: Secondary | ICD-10-CM

## 2019-08-03 DIAGNOSIS — E785 Hyperlipidemia, unspecified: Secondary | ICD-10-CM | POA: Diagnosis not present

## 2019-08-03 DIAGNOSIS — M8580 Other specified disorders of bone density and structure, unspecified site: Secondary | ICD-10-CM | POA: Diagnosis not present

## 2019-08-03 DIAGNOSIS — Z86711 Personal history of pulmonary embolism: Secondary | ICD-10-CM | POA: Insufficient documentation

## 2019-08-03 DIAGNOSIS — G4700 Insomnia, unspecified: Secondary | ICD-10-CM | POA: Insufficient documentation

## 2019-08-03 DIAGNOSIS — Z5111 Encounter for antineoplastic chemotherapy: Secondary | ICD-10-CM | POA: Diagnosis not present

## 2019-08-03 DIAGNOSIS — Z803 Family history of malignant neoplasm of breast: Secondary | ICD-10-CM | POA: Insufficient documentation

## 2019-08-03 LAB — CBC WITH DIFFERENTIAL/PLATELET
Abs Immature Granulocytes: 0.03 10*3/uL (ref 0.00–0.07)
Basophils Absolute: 0 10*3/uL (ref 0.0–0.1)
Basophils Relative: 0 %
Eosinophils Absolute: 0.1 10*3/uL (ref 0.0–0.5)
Eosinophils Relative: 1 %
HCT: 44.1 % (ref 36.0–46.0)
Hemoglobin: 14.7 g/dL (ref 12.0–15.0)
Immature Granulocytes: 0 %
Lymphocytes Relative: 9 %
Lymphs Abs: 1.1 10*3/uL (ref 0.7–4.0)
MCH: 31.6 pg (ref 26.0–34.0)
MCHC: 33.3 g/dL (ref 30.0–36.0)
MCV: 94.8 fL (ref 80.0–100.0)
Monocytes Absolute: 0.7 10*3/uL (ref 0.1–1.0)
Monocytes Relative: 6 %
Neutro Abs: 10.4 10*3/uL — ABNORMAL HIGH (ref 1.7–7.7)
Neutrophils Relative %: 84 %
Platelets: 233 10*3/uL (ref 150–400)
RBC: 4.65 MIL/uL (ref 3.87–5.11)
RDW: 15.2 % (ref 11.5–15.5)
WBC: 12.4 10*3/uL — ABNORMAL HIGH (ref 4.0–10.5)
nRBC: 0 % (ref 0.0–0.2)

## 2019-08-03 LAB — COMPREHENSIVE METABOLIC PANEL
ALT: 319 U/L — ABNORMAL HIGH (ref 0–44)
AST: 132 U/L — ABNORMAL HIGH (ref 15–41)
Albumin: 3.9 g/dL (ref 3.5–5.0)
Alkaline Phosphatase: 149 U/L — ABNORMAL HIGH (ref 38–126)
Anion gap: 10 (ref 5–15)
BUN: 29 mg/dL — ABNORMAL HIGH (ref 8–23)
CO2: 27 mmol/L (ref 22–32)
Calcium: 9 mg/dL (ref 8.9–10.3)
Chloride: 101 mmol/L (ref 98–111)
Creatinine, Ser: 1.28 mg/dL — ABNORMAL HIGH (ref 0.44–1.00)
GFR calc Af Amer: 51 mL/min — ABNORMAL LOW (ref >60)
GFR calc non Af Amer: 44 mL/min — ABNORMAL LOW (ref >60)
Glucose, Bld: 99 mg/dL (ref 70–99)
Potassium: 4.8 mmol/L (ref 3.5–5.1)
Sodium: 138 mmol/L (ref 135–145)
Total Bilirubin: 1 mg/dL (ref 0.3–1.2)
Total Protein: 6.9 g/dL (ref 6.5–8.1)

## 2019-08-03 LAB — LACTATE DEHYDROGENASE: LDH: 197 U/L — ABNORMAL HIGH (ref 98–192)

## 2019-08-03 NOTE — Assessment & Plan Note (Addendum)
#  Recurrent metastatic breast cancer-ER PR positive HER-2 negative; on Ixempra s/p 4 cycles- SEP 22nd PET scan shows-Partial response with shrinkage solitary left hepatic lobe lesion. May 06, 2019 MRI shows at least 5 new lesions [largest 10 mm]-see below.  #Currently chemotherapy is on hold because of ongoing whole brain radiation.  See discussion below regarding elevated LFTs  # Elevated LFTs- ? Etiology; progressive malignancy in the liver is high on the differential; rule out acute hepatitis.  Check hepatitis panel.  # Brain metastases; Recurrent-currently on Re-irradiation-until 11/17.   # Peripheral neuropathy grade 1-  on Lyrica; stable  # DISPOSITION: add LDH # [place] right upper quadrant STAT # Follow up in 1 weeks; MD; LFT;acute hepatitis panel-  Dr.B

## 2019-08-03 NOTE — Progress Notes (Signed)
I connected with Sarah Horne on 08/03/19 at 10:00 AM EST by video enabled telemedicine visit and verified that I am speaking with the correct person using two identifiers.  I discussed the limitations, risks, security and privacy concerns of performing an evaluation and management service by telemedicine and the availability of in-person appointments. I also discussed with the patient that there may be a patient responsible charge related to this service. The patient expressed understanding and agreed to proceed.    Other persons participating in the visit and their role in the encounter: RN/medical reconciliation Patient's location: Office Provider's location: Home  Oncology History Overview Note  # LEFT BREAST IDC; STAGE II [cT2N1] ER >90%; PR- 50-90%; her 2 NEU- POS; s/p Neoadj chemo; AUG 2016- TCH+P s/p Lumpec & partial ALND- path CR; s/p RT [finished Nov 2016];  adj Herceptin; HELD for Jan 19th 2017 [in DEC 2016-EF dropped from 63 to 51%; FEB 27th EF-42.9%]; JAN 2016 START Arimidex; May 2017- EF- 60%; May 24th 2017-Re-start Herceptin q 3W; July 28th STOP herceptin [finished 65m/m2; sec to Low EF; however 2017 Oct EF= 67%; improved]  # DEC 4th 2017- Start Neratinib 4 pills; DEC 11th 3 pills; STOPPED.  # FEB 2018- RECURRENCE ER/PR positive; Her 2 NEGATIVE [liver Bx]  # MARCH 1st 2018- Tax-Cytoxan [poor tol]  # FEB 2018- Brain mets [SBRT; Dr.Crystal; finished Feb 28th 2018]  # March 2018- Eribulin- poor tolerance  # June 8th 2018- Started Faslodex + Abema [abema-multiple interruptions sec to neutropenia]  # MRI Brain- March 2019- Leptomeningeal mets [WBRT; No LP s/p WBRT- 01/09/2018]; STOP Abema [589mday-sec to severe cytopenia]+faslodex  # MAY 20tth 019- Start Xeloda; STOPPED in Nov 22nd 2019- sec to Progressive liver lesions on CT scan  # NOV 26th 2019- Faslodex + Piqaray 25019m; Jan 2020- Piqray 300 mg+ faslodex; FEB 17th CT scan- mixed response/ Overall progression; STOP  Piqray + faslodex.   # FEB 25th 2020- ERIBULIN; Poor tolerance sec to severe neutropenia.  Stopped May 2020.  CT scan May 2020-new/progressive 2 cm liver lesion;   #July nd 2020-Ixempra  #May 06, 2019-MRI brain 5 new lesions [3 mm to 10 mm]-asymptomatic; [previous whole brain radiation]-active surveillance  # PN- G-2; may 2017- Cymbalta 85m55mMARCH 2018-  acute vascular insuff of BIL LE [? Taxotere vasoconstriction on asprin]; # hemorragic shock LGIB [s/p colo; Dr.Wohl]  #  SVT- on flecainide.   # April 2016- Liver Bx- NEG;   # Drop in EF from Herceptin [recovered OCt 27th- EF 65%]  # BMD- jan 2017- osteopenia  -----------------------------------------------------------     [place]- Sep 4th 2018- PDL-1 0%/NEG for BRCA; MSI-Stable; Positive for PI3K; ccnd-1; FGF** -----------------------------------------------------------  Dx: Metastatic Breast cancer [ER/PR-Pos; her -2 NEG] Stage IV; goals: palliative Current treatment-Ixempra   Carcinoma of upper-outer quadrant of left breast in female, estrogen receptor positive (HCC)[place]/25/2020 - 01/17/2019 Chemotherapy   The patient had eriBULin mesylate (HALAVEN) 2.1 mg in sodium chloride 0.9 % 100 mL chemo infusion, 1.2 mg/m2 = 2.1 mg (100 % of original dose 1.2 mg/m2), Intravenous,  Once, 3 of 4 cycles Dose modification: 1.2 mg/m2 (original dose 1.2 mg/m2, Cycle 1, Reason: Provider Judgment), 0.8 mg/m2 (original dose 1.2 mg/m2, Cycle 3, Reason: Provider Judgment) Administration: 2.1 mg (11/22/2018), 2.1 mg (11/29/2018), 2.1 mg (12/20/2018), 1.4 mg (01/17/2019)  for chemotherapy treatment.    03/30/2019 -  Chemotherapy   The patient had pegfilgrastim (NEULASTA ONPRO KIT) injection 6 mg, 6 mg, Subcutaneous, Once, 5 of  8 cycles Administration: 6 mg (03/30/2019), 6 mg (04/20/2019), 6 mg (05/11/2019), 6 mg (06/22/2019), 6 mg (06/01/2019) ixabepilone (IXEMPRA) 60 mg in lactated ringers 250 mL chemo infusion, 33.5 mg/m2 = 57 mg  (100 % of original dose 32 mg/m2), Intravenous,  Once, 5 of 8 cycles Dose modification: 32 mg/m2 (original dose 32 mg/m2, Cycle 1, Reason: Provider Judgment) Administration: 72 mg (04/20/2019), 72 mg (05/11/2019), 72 mg (06/22/2019), 72 mg (06/01/2019)  for chemotherapy treatment.     Chief Complaint: Breast cancer  History of present illness:Sarah Horne 63 y.o.  female with history of ER/PR positive HER-2 negative breast cancer currently on whole brain radiation is here for follow-up.  Patient systemic chemotherapy with exam prescribed on hold because of whole brain radiation.  Patient had problems with insomnia with the steroids.  However, since he started taking the steroids /dexamethasone in the afternoons-she is able to sleep better at night.  No nausea no vomiting.  No headaches.  Appetite is good.  No weight loss.  Observation/objective:  Assessment and plan: Carcinoma of upper-outer quadrant of left breast in female, estrogen receptor positive ([place]) # Recurrent metastatic breast cancer-ER PR positive HER-2 negative; on Ixempra s/p 4 cycles- SEP 22nd PET scan shows-Partial response with shrinkage solitary left hepatic lobe lesion. May 06, 2019 MRI shows at least 5 new lesions [largest 10 mm]-see below.  #Currently chemotherapy is on hold because of ongoing whole brain radiation.  See discussion below regarding elevated LFTs  # Elevated LFTs- ? Etiology; progressive malignancy in the liver is high on the differential; rule out acute hepatitis.  Check hepatitis panel.  # Brain metastases; Recurrent-currently on Re-irradiation-until 11/17.   # Peripheral neuropathy grade 1-  on Lyrica; stable  # DISPOSITION: add LDH # [place] right upper quadrant STAT # Follow up in 1 weeks; MD; LFT;acute hepatitis panel-  Dr.B    Follow-up instructions:  I discussed the assessment and treatment plan with the patient.  The patient was provided an opportunity to ask questions and all were  answered.  The patient agreed with the plan and demonstrated understanding of instructions.  The patient was advised to call back or seek an in person evaluation if the symptoms worsen or if the condition fails to improve as anticipated.  Dr. Charlaine Dalton [place] at [facility] 08/03/2019 10:22 AM

## 2019-08-03 NOTE — Progress Notes (Signed)
[place]  [phone] [phone]   Name: Sarah Horne Date: 08/03/2019 MRN: [id number]  DOB: 12/02/1955  Patient Care Team: Arnetha Courser, MD as PCP - General (Family Medicine) Wellington Hampshire, MD as PCP - Cardiology (Cardiology) Byrnett, Forest Gleason, MD (General Surgery) [name], MD (Inactive) as Attending Physician (Internal Medicine)    REASON FOR CONSULTATION: Palliative Care consult requested for this 63 y.o. female with multiple medical problems including stage IV breast cancer with leptomeningeal and liver metastases who has had disease progression on multiple previous lines of treatment.  MRI of the brain on 05/06/2019 shows a several new metastatic lesions.  MRI of the brain on 07/06/2019 also showed evidence of disease progression with enlarging existing lesions and note of interval new metastatic lesions.  Patient was referred to palliative care to help address goals and manage ongoing symptoms.  SOCIAL HISTORY:     reports that she has been smoking cigarettes. She has a 9.00 pack-year smoking history. She has never used smokeless tobacco. She reports that she does not drink alcohol or use drugs.   Patient is not married.  She lives at home alone.  She has a son and daughter who live about 30 minutes away.  She has a brother who is involved in her care.  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  ADVANCE DIRECTIVES:  HC POA and living will on file dated 12/12/2016.  Patient's brother is her healthcare power of attorney.  CODE STATUS: DNR/DNI (MOST form completed 07/13/2019)  PAST MEDICAL HISTORY: Past Medical History:  Diagnosis Date   Chemotherapy-induced peripheral neuropathy ([place])    Epistaxis    a. 11/2016 in setting of asa/plavix-->silver nitrate cauterization.   GI bleed    a. 11/2016 Admission w/ GIB and hypovolemic shock req 3u PRBC's;  b. 11/2016 ECG: gastritis &  nonbleeding peptic ulcer; c. 11/2016 Conlonoscopy: rectal and sigmoid colonic ulcers.   Heart attack ([place])    a. 1998 Cath @ [facility]: reportedly no intervention required.   Herceptin-induced cardiomyopathy ([place])    a. In the setting of Herceptin Rx for breast cancer (initiated 12/2014); b. 03/2015 MUGA EF 64%; b. 08/2015 MUGA: EF 51%; c. 10/2015 MUGA: EF 44%; d. 11/2015 Echo: EF 45-50%; e. 01/2016 MUGA: EF 60%; f. 06/2016 MUGA EF 65%; g. 10/2016 MUGA: EF 61%;  h. 12/2016 Echo: EF 55-60%, gr1 DD.   Hyperlipidemia    Hypertension    Neuropathy    Possible PAD (peripheral artery disease) ([place])    a. 11/2016 LE cyanosis and weak pulses-->CTA w/o significant Ao-BiFem dzs. ? distal dzs-->ASA/Plavix initiated by vascular surgery.   PSVT (paroxysmal supraventricular tachycardia) ([place])    a. Dx 11/2016.   Pulmonary embolism ([place])    a. 12/2016 CTA Chest: small nonocclusive PE in inferior segment of the Left lingula, somewhat eccentric filling defect suggesting chronic rather than acute embolic event; b. 10/4233 LE U/S:  No DVT; c. 12/2016 Echo: Nl RV fxn, nl PASP.   Recurrent Metastatic breast cancer ([place])    a. Dx 2016: Stage II, ER positive, PR positive, HER-2/neu overexpressing of the left breast-->chemo/radiation; b. 10/2016 CT Abd/pelvis: diffuse liver mets, ill defined sclerotic bone lesions-T12;  c. 10/2016 MRI brain: metastatic lesion along L temporal lobe (19x36m) w/ extensive surrounding edema & 558mmidline shift to right.   Sinus tachycardia     PAST SURGICAL HISTORY:  Past Surgical History:  Procedure Laterality Date  BREAST BIOPSY Left 2016   Positive   BREAST LUMPECTOMY WITH SENTINEL LYMPH NODE BIOPSY Left 05/23/2015   Procedure: LEFT BREAST WIDE EXCISION WITH AXILLARY DISSECTION, MASTOPLASTY ;  Surgeon: [name], MD;  Location: [facility] ORS;  Service: General;  Laterality: Left;   BREAST SURGERY Left 12/18/14   breast biopsy/INVASIVE DUCTAL CARCINOMA OF BREAST, NOTTINGHAM GRADE 2.    BREAST SURGERY  05/23/2015.   Wide excision/mastoplasty, axillary dissection. No residual invasive cancer, positive for residual DCIS. 0/2 nodes identified on axillary dissection. (no SLN by technetium or methylene blue)   CARDIAC CATHETERIZATION     COLONOSCOPY WITH PROPOFOL N/A 12/10/2016   Procedure: COLONOSCOPY WITH PROPOFOL;  Surgeon: Lucilla Lame, MD;  Location: [facility] ENDOSCOPY;  Service: Endoscopy;  Laterality: N/A;   COLONOSCOPY WITH PROPOFOL N/A 02/13/2017   Procedure: COLONOSCOPY WITH PROPOFOL;  Surgeon: Lucilla Lame, MD;  Location: [facility] ENDOSCOPY;  Service: Endoscopy;  Laterality: N/A;   ESOPHAGOGASTRODUODENOSCOPY (EGD) WITH PROPOFOL N/A 12/08/2016   Procedure: ESOPHAGOGASTRODUODENOSCOPY (EGD) WITH PROPOFOL;  Surgeon: Lucilla Lame, MD;  Location: [facility] ENDOSCOPY;  Service: Endoscopy;  Laterality: N/A;   IVC FILTER INSERTION N/A 02/15/2017   Procedure: IVC Filter Insertion;  Surgeon: Algernon Huxley, MD;  Location: [place] CV LAB;  Service: Cardiovascular;  Laterality: N/A;   PORTACATH PLACEMENT Right 12-31-14   Dr Bary Castilla    HEMATOLOGY/ONCOLOGY HISTORY:  Oncology History Overview Note  # LEFT BREAST IDC; STAGE II [cT2N1] ER >90%; PR- 50-90%; her 2 NEU- POS; s/p Neoadj chemo; AUG 2016- TCH+P s/p Lumpec & partial ALND- path CR; s/p RT [finished Nov 2016];  adj Herceptin; HELD for Jan 19th 2017 [in DEC 2016-EF dropped from 63 to 51%; FEB 27th EF-42.9%]; JAN 2016 START Arimidex; May 2017- EF- 60%; May 24th 2017-Re-start Herceptin q 3W; July 28th STOP herceptin [finished 70m/m2; sec to Low EF; however 2017 Oct EF= 67%; improved]  # DEC 4th 2017- Start Neratinib 4 pills; DEC 11th 3 pills; STOPPED.  # FEB 2018- RECURRENCE ER/PR positive; Her 2 NEGATIVE [liver Bx]  # MARCH 1st 2018- Tax-Cytoxan [poor tol]  # FEB 2018- Brain mets [SBRT; Dr.Crystal; finished Feb 28th 2018]  # March 2018- Eribulin- poor tolerance  # June 8th 2018- Started Faslodex + Abema [abema-multiple interruptions  sec to neutropenia]  # MRI Brain- March 2019- Leptomeningeal mets [WBRT; No LP s/p WBRT- 01/09/2018]; STOP Abema [567mday-sec to severe cytopenia]+faslodex  # MAY 20tth 019- Start Xeloda; STOPPED in Nov 22nd 2019- sec to Progressive liver lesions on CT scan  # NOV 26th 2019- Faslodex + Piqaray 25042m; Jan 2020- Piqray 300 mg+ faslodex; FEB 17th CT scan- mixed response/ Overall progression; STOP Piqray + faslodex.   # FEB 25th 2020- ERIBULIN; Poor tolerance sec to severe neutropenia.  Stopped May 2020.  CT scan May 2020-new/progressive 2 cm liver lesion;   #July nd 2020-Ixempra  #May 06, 2019-MRI brain 5 new lesions [3 mm to 10 mm]-asymptomatic; [previous whole brain radiation]-active surveillance  # PN- G-2; may 2017- Cymbalta 67m28mMARCH 2018-  acute vascular insuff of BIL LE [? Taxotere vasoconstriction on asprin]; # hemorragic shock LGIB [s/p colo; Dr.Wohl]  #  SVT- on flecainide.   # April 2016- Liver Bx- NEG;   # Drop in EF from Herceptin [recovered OCt 27th- EF 65%]  # BMD- jan 2017- osteopenia  -----------------------------------------------------------     MOLEOsgood- Sep 4th 2018- PDL-1 0%/NEG for BRCA; MSI-Stable; Positive for PI3K; ccnd-1; FGF** -----------------------------------------------------------  Dx: Metastatic Breast cancer [ER/PR-Pos; her -2  NEG] Stage IV; goals: palliative Current treatment-Ixempra   Carcinoma of upper-outer quadrant of left breast in female, estrogen receptor positive ([place])  11/22/2018 - 01/17/2019 Chemotherapy   The patient had eriBULin mesylate (HALAVEN) 2.1 mg in sodium chloride 0.9 % 100 mL chemo infusion, 1.2 mg/m2 = 2.1 mg (100 % of original dose 1.2 mg/m2), Intravenous,  Once, 3 of 4 cycles Dose modification: 1.2 mg/m2 (original dose 1.2 mg/m2, Cycle 1, Reason: Provider Judgment), 0.8 mg/m2 (original dose 1.2 mg/m2, Cycle 3, Reason: Provider Judgment) Administration: 2.1 mg (11/22/2018), 2.1 mg (11/29/2018),  2.1 mg (12/20/2018), 1.4 mg (01/17/2019)  for chemotherapy treatment.    03/30/2019 -  Chemotherapy   The patient had pegfilgrastim (NEULASTA ONPRO KIT) injection 6 mg, 6 mg, Subcutaneous, Once, 5 of 8 cycles Administration: 6 mg (03/30/2019), 6 mg (04/20/2019), 6 mg (05/11/2019), 6 mg (06/22/2019), 6 mg (06/01/2019) ixabepilone (IXEMPRA) 60 mg in lactated ringers 250 mL chemo infusion, 33.5 mg/m2 = 57 mg (100 % of original dose 32 mg/m2), Intravenous,  Once, 5 of 8 cycles Dose modification: 32 mg/m2 (original dose 32 mg/m2, Cycle 1, Reason: Provider Judgment) Administration: 72 mg (04/20/2019), 72 mg (05/11/2019), 72 mg (06/22/2019), 72 mg (06/01/2019)  for chemotherapy treatment.      ALLERGIES:  is allergic to no known allergies.  MEDICATIONS:  Current Outpatient Medications  Medication Sig Dispense Refill   dexamethasone (DECADRON) 2 MG tablet Take 1 pill BID [morning and afternoon] 45 tablet 0   flecainide (TAMBOCOR) 50 MG tablet Take 1 tablet (50 mg total) by mouth 2 (two) times daily. 180 tablet 3   Magnesium Oxide 400 (240 Mg) MG TABS TAKE 1 TABLET BY MOUTH TWICE A DAY 60 tablet 0   pregabalin (LYRICA) 75 MG capsule Take 1 capsule (75 mg total) by mouth 2 (two) times daily. 180 capsule 0   Vitamin D, Ergocalciferol, (DRISDOL) 1.25 MG (50000 UT) CAPS capsule TAKE 1 CAPSULE (50,000 UNITS TOTAL) ONCE A WEEK BY MOUTH. 12 capsule 1   No current facility-administered medications for this visit.    Facility-Administered Medications Ordered in Other Visits  Medication Dose Route Frequency Provider Last Rate Last Dose   0.9 %  sodium chloride infusion   Intravenous Continuous Pandit, Sandeep, MD       0.9 %  sodium chloride infusion   Intravenous Continuous Lloyd Huger, MD 999 mL/hr at 04/24/15 1520     heparin lock flush 100 unit/mL  500 Units Intravenous Once Berenzon, Dmitriy, MD       sodium chloride 0.9 % injection 10 mL  10 mL Intravenous PRN [name], MD   10 mL at  04/04/15 1440   sodium chloride flush (NS) 0.9 % injection 10 mL  10 mL Intravenous PRN Berenzon, Dmitriy, MD        VITAL SIGNS: There were no vitals taken for this visit. There were no vitals filed for this visit.  Estimated body mass index is 26.44 kg/m as calculated from the following:   Height as of 07/20/19: 5' 6"  (1.676 m).   Weight as of an earlier encounter on 08/03/19: 163 lb 12.8 oz (74.3 kg).  LABS: CBC:    Component Value Date/Time   WBC 12.4 (H) 08/03/2019 0859   HGB 14.7 08/03/2019 0859   HGB 10.2 (L) 03/04/2017 0918   HCT 44.1 08/03/2019 0859   HCT 33.3 (L) 03/04/2017 0918   PLT 233 08/03/2019 0859   PLT 225 03/04/2017 0918   MCV 94.8 08/03/2019 0859  MCV 89 03/04/2017 0918   MCV 90 01/22/2015 1559   NEUTROABS 10.4 (H) 08/03/2019 0859   NEUTROABS 3.1 03/04/2017 0918   NEUTROABS 34.8 (H) 01/22/2015 1559   LYMPHSABS 1.1 08/03/2019 0859   LYMPHSABS 0.7 03/04/2017 0918   LYMPHSABS 3.4 01/22/2015 1559   MONOABS 0.7 08/03/2019 0859   MONOABS 1.0 (H) 01/22/2015 1559   EOSABS 0.1 08/03/2019 0859   EOSABS 0.1 03/04/2017 0918   EOSABS 0.0 01/22/2015 1559   BASOSABS 0.0 08/03/2019 0859   BASOSABS 0.0 03/04/2017 0918   BASOSABS 0.0 01/22/2015 1559   Comprehensive Metabolic Panel:    Component Value Date/Time   NA 138 08/03/2019 0859   NA 135 03/04/2017 0918   NA 135 01/08/2015 0850   K 4.8 08/03/2019 0859   K 3.7 01/08/2015 0850   CL 101 08/03/2019 0859   CL 101 01/08/2015 0850   CO2 27 08/03/2019 0859   CO2 26 01/08/2015 0850   BUN 29 (H) 08/03/2019 0859   BUN 8 03/04/2017 0918   BUN 17 01/08/2015 0850   CREATININE 1.28 (H) 08/03/2019 0859   CREATININE 0.82 01/22/2015 1559   GLUCOSE 99 08/03/2019 0859   GLUCOSE 161 (H) 01/08/2015 0850   CALCIUM 9.0 08/03/2019 0859   CALCIUM 9.3 01/08/2015 0850   AST 132 (H) 08/03/2019 0859   AST 34 01/22/2015 1559   ALT 319 (H) 08/03/2019 0859   ALT 42 01/22/2015 1559   ALKPHOS 149 (H) 08/03/2019 0859   ALKPHOS  157 (H) 01/22/2015 1559   BILITOT 1.0 08/03/2019 0859   BILITOT 6.2 (H) 03/04/2017 0918   BILITOT 0.2 (L) 01/22/2015 1559   PROT 6.9 08/03/2019 0859   PROT 6.4 03/04/2017 0918   PROT 6.8 01/22/2015 1559   ALBUMIN 3.9 08/03/2019 0859   ALBUMIN 2.5 (L) 03/04/2017 0918   ALBUMIN 3.9 01/22/2015 1559    RADIOGRAPHIC STUDIES: Mr Jeri Cos QM Contrast  Result Date: 07/06/2019 CLINICAL DATA:  63 year old female with metastatic breast cancer status post whole brain radiation in the spring of 2019. EXAM: MRI HEAD WITHOUT AND WITH CONTRAST TECHNIQUE: Multiplanar, multiecho pulse sequences of the brain and surrounding structures were obtained without and with intravenous contrast. CONTRAST:  35m GADAVIST GADOBUTROL 1 MMOL/ML IV SOLN COMPARISON:  05/09/2019 brain MRI and earlier. FINDINGS: BRAIN New Lesions: There are several new punctate brain metastases: - right cerebellar hemisphere series 18, image 39 (2 millimeters). - right superior cerebellar surface series 18, image 59 (2-3 millimeters). -right thalamic 2 millimeter metastasis on image 91. - and a questionable 1-2 millimeter left cerebellar metastasis on image 58. Larger lesions: All of the previously identified metastases have enlarged by 1 or 2 millimeters since August: - medial left cerebellar metastasis on image 46 today, now 7 millimeters. - medial left occipital lobe metastasis on image 65, now 7-8 millimeters - inferior left temporal metastasis on image 47 (4 millimeters) - right parieto-occipital sulcus metastasis medially on image 91, now 7-8 millimeters. - anterior right frontal lobe superficial cortical and subcortical metastasis now 11 millimeters on image 94. Stable or Smaller lesions: Stable subtle treated left temporal lobe lesion on series 18, image 72 today. Other Brain findings: Mild vasogenic edema associated with the larger metastases. No associated mass effect. Stable FLAIR hyperintensity associated with the treated left temporal lobe  lesion on series 15, image 24. Confluent bilateral cerebral white matter T2 and FLAIR hyperintensity compatible with sequelae of prior whole brain radiation. No superimposed restricted diffusion to suggest acute infarction. No midline shift, ventriculomegaly,  extra-axial collection or acute intracranial hemorrhage. Cervicomedullary junction and pituitary are within normal limits. No dural thickening identified. Vascular: Major intracranial vascular flow voids are stable, with evidence of fetal type bilateral PCA origins. The major dural venous sinuses are enhancing and appear to be patent. Skull and upper cervical spine: Abnormal marrow signal in the C2 vertebra redemonstrated. Calvarium bone marrow signal appears stable and within normal limits. Negative visible cervical spine. Sinuses/Orbits: Negative orbits. Paranasal sinuses are clear. Other: Trace mastoid effusions are stable. Visible internal auditory structures appear normal. Scalp and face soft tissues appear negative. IMPRESSION: 1. Three and possibly four new punctate brain metastases since August, most in the cerebellum. 2. All of the brain metastases identified in August have enlarged by 1-2 mm. Mild edema associated with the larger lesions, no mass effect. 3. Treated left temporal lobe metastasis remain stable. 4. Sequelae of prior whole brain radiation. 5. A total of 8 (vs 9) small enhancing brain metastases, each annotated on series 18. Electronically Signed   By: Genevie Ann M.D.   On: 07/06/2019 17:25    PERFORMANCE STATUS (ECOG) : 1 - Symptomatic but completely ambulatory  Review of Systems Unless otherwise noted, a complete review of systems is negative.  Physical Exam General: NAD, frail appearing, thin Pulmonary: Unlabored Extremities: no edema, no joint deformities Skin: no rashes Neurological: Weakness but otherwise nonfocal  IMPRESSION: Routine follow-up visit today.  Patient reports that she is doing well.  She denies any acute  changes or concerns.  She denies any distressing symptoms today.  She reports that she has had tremendous burst of energy and appetite with initiation of dexamethasone.  She has been cleaning her house and raking her leaves in the yard.  Patient continues to receive XRT to the brain and seems to be tolerating it well.  She has a follow-up appointment today with Dr. Jacinto Reap.  PLAN: -Continue current scope of treatment -RTC in 2 to 3 weeks   Patient expressed understanding and was in agreement with this plan. She also understands that She can call the clinic at any time with any questions, concerns, or complaints.     Time Total: 15 minutes  Visit consisted of counseling and education dealing with the complex and emotionally intense issues of symptom management and palliative care in the setting of serious and potentially life-threatening illness.Greater than 50%  of this time was spent counseling and coordinating care related to the above assessment and plan.  Signed by: Altha Harm, PhD, NP-C [phone] (Work Cell)

## 2019-08-04 ENCOUNTER — Ambulatory Visit
Admission: RE | Admit: 2019-08-04 | Discharge: 2019-08-04 | Disposition: A | Discharge status: Home / Self Care | Payer: Medicare HMO | Source: Ambulatory Visit | Attending: Radiation Oncology | Admitting: Radiation Oncology

## 2019-08-04 ENCOUNTER — Ambulatory Visit
Admission: RE | Admit: 2019-08-04 | Discharge: 2019-08-04 | Disposition: A | Discharge status: Home / Self Care | Payer: Medicare HMO | Source: Ambulatory Visit | Attending: Internal Medicine | Admitting: Internal Medicine

## 2019-08-04 DIAGNOSIS — C7931 Secondary malignant neoplasm of brain: Secondary | ICD-10-CM | POA: Diagnosis not present

## 2019-08-04 DIAGNOSIS — R748 Abnormal levels of other serum enzymes: Secondary | ICD-10-CM | POA: Diagnosis not present

## 2019-08-04 DIAGNOSIS — K7689 Other specified diseases of liver: Secondary | ICD-10-CM | POA: Diagnosis not present

## 2019-08-04 DIAGNOSIS — Z170 Estrogen receptor positive status [ER+]: Secondary | ICD-10-CM | POA: Diagnosis not present

## 2019-08-04 DIAGNOSIS — C50412 Malignant neoplasm of upper-outer quadrant of left female breast: Secondary | ICD-10-CM | POA: Diagnosis not present

## 2019-08-07 ENCOUNTER — Ambulatory Visit
Admission: RE | Admit: 2019-08-07 | Discharge: 2019-08-07 | Disposition: A | Discharge status: Home / Self Care | Payer: Medicare HMO | Source: Ambulatory Visit | Attending: Radiation Oncology | Admitting: Radiation Oncology

## 2019-08-07 ENCOUNTER — Other Ambulatory Visit: Payer: Self-pay

## 2019-08-07 DIAGNOSIS — C7931 Secondary malignant neoplasm of brain: Secondary | ICD-10-CM | POA: Diagnosis not present

## 2019-08-07 DIAGNOSIS — C773 Secondary and unspecified malignant neoplasm of axilla and upper limb lymph nodes: Secondary | ICD-10-CM | POA: Diagnosis not present

## 2019-08-07 DIAGNOSIS — Z170 Estrogen receptor positive status [ER+]: Secondary | ICD-10-CM | POA: Diagnosis not present

## 2019-08-07 DIAGNOSIS — C50412 Malignant neoplasm of upper-outer quadrant of left female breast: Secondary | ICD-10-CM | POA: Diagnosis not present

## 2019-08-08 ENCOUNTER — Ambulatory Visit
Admission: RE | Admit: 2019-08-08 | Discharge: 2019-08-08 | Disposition: A | Discharge status: Home / Self Care | Payer: Medicare HMO | Source: Ambulatory Visit | Attending: Radiation Oncology | Admitting: Radiation Oncology

## 2019-08-08 ENCOUNTER — Other Ambulatory Visit: Payer: Self-pay

## 2019-08-08 ENCOUNTER — Telehealth: Payer: Self-pay | Admitting: Internal Medicine

## 2019-08-08 DIAGNOSIS — C50412 Malignant neoplasm of upper-outer quadrant of left female breast: Secondary | ICD-10-CM | POA: Diagnosis not present

## 2019-08-08 DIAGNOSIS — Z170 Estrogen receptor positive status [ER+]: Secondary | ICD-10-CM | POA: Diagnosis not present

## 2019-08-08 DIAGNOSIS — C7931 Secondary malignant neoplasm of brain: Secondary | ICD-10-CM | POA: Diagnosis not present

## 2019-08-08 NOTE — Telephone Encounter (Signed)
Left a message for patient regarding results of ultrasound-inconclusive.  We will plan to repeat labs this week as planned.

## 2019-08-09 ENCOUNTER — Ambulatory Visit
Admission: RE | Admit: 2019-08-09 | Discharge: 2019-08-09 | Disposition: A | Discharge status: Home / Self Care | Payer: Medicare HMO | Source: Ambulatory Visit | Attending: Radiation Oncology | Admitting: Radiation Oncology

## 2019-08-09 ENCOUNTER — Other Ambulatory Visit: Payer: Self-pay | Admitting: Physician Assistant

## 2019-08-09 ENCOUNTER — Other Ambulatory Visit: Payer: Self-pay | Admitting: *Deleted

## 2019-08-09 ENCOUNTER — Other Ambulatory Visit: Payer: Self-pay

## 2019-08-09 DIAGNOSIS — C50412 Malignant neoplasm of upper-outer quadrant of left female breast: Secondary | ICD-10-CM

## 2019-08-09 DIAGNOSIS — Z170 Estrogen receptor positive status [ER+]: Secondary | ICD-10-CM | POA: Diagnosis not present

## 2019-08-09 DIAGNOSIS — C7931 Secondary malignant neoplasm of brain: Secondary | ICD-10-CM | POA: Diagnosis not present

## 2019-08-09 DIAGNOSIS — R748 Abnormal levels of other serum enzymes: Secondary | ICD-10-CM

## 2019-08-09 NOTE — Telephone Encounter (Signed)
Please advise if ok to refill Magnesium 400 mg tablet bid.

## 2019-08-10 ENCOUNTER — Inpatient Hospital Stay: Payer: Medicare HMO

## 2019-08-10 ENCOUNTER — Ambulatory Visit
Admission: RE | Admit: 2019-08-10 | Discharge: 2019-08-10 | Disposition: A | Discharge status: Home / Self Care | Payer: Medicare HMO | Source: Ambulatory Visit | Attending: Radiation Oncology | Admitting: Radiation Oncology

## 2019-08-10 ENCOUNTER — Encounter: Payer: Self-pay | Admitting: Internal Medicine

## 2019-08-10 ENCOUNTER — Inpatient Hospital Stay: Payer: Medicare HMO | Admitting: Hospice and Palliative Medicine

## 2019-08-10 ENCOUNTER — Telehealth: Payer: Self-pay | Admitting: *Deleted

## 2019-08-10 ENCOUNTER — Inpatient Hospital Stay (HOSPITAL_BASED_OUTPATIENT_CLINIC_OR_DEPARTMENT_OTHER): Payer: Medicare HMO | Admitting: Internal Medicine

## 2019-08-10 ENCOUNTER — Other Ambulatory Visit: Payer: Self-pay

## 2019-08-10 DIAGNOSIS — C50412 Malignant neoplasm of upper-outer quadrant of left female breast: Secondary | ICD-10-CM | POA: Diagnosis not present

## 2019-08-10 DIAGNOSIS — R7989 Other specified abnormal findings of blood chemistry: Secondary | ICD-10-CM | POA: Diagnosis not present

## 2019-08-10 DIAGNOSIS — R748 Abnormal levels of other serum enzymes: Secondary | ICD-10-CM

## 2019-08-10 DIAGNOSIS — Z170 Estrogen receptor positive status [ER+]: Secondary | ICD-10-CM

## 2019-08-10 DIAGNOSIS — C787 Secondary malignant neoplasm of liver and intrahepatic bile duct: Secondary | ICD-10-CM | POA: Diagnosis not present

## 2019-08-10 DIAGNOSIS — Z86711 Personal history of pulmonary embolism: Secondary | ICD-10-CM | POA: Diagnosis not present

## 2019-08-10 DIAGNOSIS — Z5111 Encounter for antineoplastic chemotherapy: Secondary | ICD-10-CM | POA: Diagnosis not present

## 2019-08-10 DIAGNOSIS — Z515 Encounter for palliative care: Secondary | ICD-10-CM | POA: Diagnosis not present

## 2019-08-10 DIAGNOSIS — R5383 Other fatigue: Secondary | ICD-10-CM | POA: Diagnosis not present

## 2019-08-10 DIAGNOSIS — C7931 Secondary malignant neoplasm of brain: Secondary | ICD-10-CM | POA: Diagnosis not present

## 2019-08-10 LAB — CBC WITH DIFFERENTIAL/PLATELET
Abs Immature Granulocytes: 0.04 10*3/uL (ref 0.00–0.07)
Basophils Absolute: 0 10*3/uL (ref 0.0–0.1)
Basophils Relative: 0 %
Eosinophils Absolute: 0 10*3/uL (ref 0.0–0.5)
Eosinophils Relative: 0 %
HCT: 41.6 % (ref 36.0–46.0)
Hemoglobin: 14.4 g/dL (ref 12.0–15.0)
Immature Granulocytes: 0 %
Lymphocytes Relative: 4 %
Lymphs Abs: 0.4 10*3/uL — ABNORMAL LOW (ref 0.7–4.0)
MCH: 32.1 pg (ref 26.0–34.0)
MCHC: 34.6 g/dL (ref 30.0–36.0)
MCV: 92.7 fL (ref 80.0–100.0)
Monocytes Absolute: 0.4 10*3/uL (ref 0.1–1.0)
Monocytes Relative: 4 %
Neutro Abs: 9.5 10*3/uL — ABNORMAL HIGH (ref 1.7–7.7)
Neutrophils Relative %: 92 %
Platelets: 175 10*3/uL (ref 150–400)
RBC: 4.49 MIL/uL (ref 3.87–5.11)
RDW: 15.6 % — ABNORMAL HIGH (ref 11.5–15.5)
WBC: 10.3 10*3/uL (ref 4.0–10.5)
nRBC: 0 % (ref 0.0–0.2)

## 2019-08-10 LAB — COMPREHENSIVE METABOLIC PANEL
ALT: 300 U/L — ABNORMAL HIGH (ref 0–44)
AST: 104 U/L — ABNORMAL HIGH (ref 15–41)
Albumin: 3.7 g/dL (ref 3.5–5.0)
Alkaline Phosphatase: 140 U/L — ABNORMAL HIGH (ref 38–126)
Anion gap: 9 (ref 5–15)
BUN: 31 mg/dL — ABNORMAL HIGH (ref 8–23)
CO2: 26 mmol/L (ref 22–32)
Calcium: 9.1 mg/dL (ref 8.9–10.3)
Chloride: 102 mmol/L (ref 98–111)
Creatinine, Ser: 0.96 mg/dL (ref 0.44–1.00)
GFR calc Af Amer: >60 mL/min (ref >60)
GFR calc non Af Amer: >60 mL/min (ref >60)
Glucose, Bld: 133 mg/dL — ABNORMAL HIGH (ref 70–99)
Potassium: 4 mmol/L (ref 3.5–5.1)
Sodium: 137 mmol/L (ref 135–145)
Total Bilirubin: 1 mg/dL (ref 0.3–1.2)
Total Protein: 6.8 g/dL (ref 6.5–8.1)

## 2019-08-10 NOTE — Telephone Encounter (Signed)
Spoke with patient. Discussed that her the hep panel is still pending. She asked why this was being ordered. I explained to her that Dr. Jacinto Reap would like to determine the cause of her elevated LFTS (malignancy vs an inflammed liver). She gave verbal understanding and understands that we will call her back as soon as results are available.

## 2019-08-10 NOTE — Telephone Encounter (Signed)
Patient called asking if her blood work showed if she has an infection or not. The Hepatitis panel is still in process. Please return her call [phone]

## 2019-08-10 NOTE — Assessment & Plan Note (Addendum)
#  Recurrent metastatic breast cancer-ER PR positive HER-2 negative; on Ixempra s/p 4 cycles- SEP 22nd PET scan shows-Partial response with shrinkage solitary left hepatic lobe lesion. May 06, 2019 BRAIN MRI shows at least 5 new lesions [largest 10 mm]-see below.  #Currently chemotherapy is on hold because of ongoing whole brain radiation.  See discussion below regarding elevated LFTs  # Elevated LFTs- ? Etiology; ultrasound liver-inconclusive. Progressive malignancy in the liver is high on the differential; rule out acute hepatitis.  Check hepatitis panel.  # Brain metastases; Recurrent-currently on Re-irradiation-until 11/17.   # Peripheral neuropathy grade 1-  on Lyrica; stable  # DISPOSITION:  # Follow up on 11/30 or dec 1st; MD;labs- cbc/cmp/Ixempra-Dr.B

## 2019-08-10 NOTE — Progress Notes (Signed)
[place] OFFICE PROGRESS NOTE  Patient Care Team: Lada, Satira Anis, MD as PCP - General (Family Medicine) Wellington Hampshire, MD as PCP - Cardiology (Cardiology) Byrnett, Forest Gleason, MD (General Surgery) [name], MD (Inactive) as Attending Physician (Internal Medicine)  Cancer Staging No matching staging information was found for the patient.   Oncology History Overview Note  # LEFT BREAST IDC; STAGE II [cT2N1] ER >90%; PR- 50-90%; her 2 NEU- POS; s/p Neoadj chemo; AUG 2016- TCH+P s/p Lumpec & partial ALND- path CR; s/p RT [finished Nov 2016];  adj Herceptin; HELD for Jan 19th 2017 [in DEC 2016-EF dropped from 63 to 51%; FEB 27th EF-42.9%]; JAN 2016 START Arimidex; May 2017- EF- 60%; May 24th 2017-Re-start Herceptin q 3W; July 28th STOP herceptin [finished 29m/m2; sec to Low EF; however 2017 Oct EF= 67%; improved]  # DEC 4th 2017- Start Neratinib 4 pills; DEC 11th 3 pills; STOPPED.  # FEB 2018- RECURRENCE ER/PR positive; Her 2 NEGATIVE [liver Bx]  # MARCH 1st 2018- Tax-Cytoxan [poor tol]  # FEB 2018- Brain mets [SBRT; Dr.Crystal; finished Feb 28th 2018]  # March 2018- Eribulin- poor tolerance  # June 8th 2018- Started Faslodex + Abema [abema-multiple interruptions sec to neutropenia]  # MRI Brain- March 2019- Leptomeningeal mets [WBRT; No LP s/p WBRT- 01/09/2018]; STOP Abema [572mday-sec to severe cytopenia]+faslodex  # MAY 20tth 019- Start Xeloda; STOPPED in Nov 22nd 2019- sec to Progressive liver lesions on CT scan  # NOV 26th 2019- Faslodex + Piqaray 25047m; Jan 2020- Piqray 300 mg+ faslodex; FEB 17th CT scan- mixed response/ Overall progression; STOP Piqray + faslodex.   # FEB 25th 2020- ERIBULIN; Poor tolerance sec to severe neutropenia.  Stopped May 2020.  CT scan May 2020-new/progressive 2 cm liver lesion;   #July nd 2020-Ixempra  #May 06, 2019-MRI brain 5 new lesions [3 mm to 10 mm]-asymptomatic; [previous whole brain radiation]-active  surveillance  # PN- G-2; may 2017- Cymbalta 73m22mMARCH 2018-  acute vascular insuff of BIL LE [? Taxotere vasoconstriction on asprin]; # hemorragic shock LGIB [s/p colo; Dr.Wohl]  #  SVT- on flecainide.   # April 2016- Liver Bx- NEG;   # Drop in EF from Herceptin [recovered OCt 27th- EF 65%]  # BMD- jan 2017- osteopenia  -----------------------------------------------------------     [place]- [place]- PDL-1 0%/NEG for BRCA; MSI-Stable; Positive for PI3K; ccnd-1; FGF** -----------------------------------------------------------  Dx: Metastatic Breast cancer [ER/PR-Pos; her -2 NEG] Stage IV; goals: palliative Current treatment-Ixempra   Carcinoma of upper-outer quadrant of left breast in female, estrogen receptor positive (HCC)[place]/25/2020 - 01/17/2019 Chemotherapy   The patient had eriBULin mesylate (HALAVEN) 2.1 mg in sodium chloride 0.9 % 100 mL chemo infusion, 1.2 mg/m2 = 2.1 mg (100 % of original dose 1.2 mg/m2), Intravenous,  Once, 3 of 4 cycles Dose modification: 1.2 mg/m2 (original dose 1.2 mg/m2, Cycle 1, Reason: Provider Judgment), 0.8 mg/m2 (original dose 1.2 mg/m2, Cycle 3, Reason: Provider Judgment) Administration: 2.1 mg (11/22/2018), 2.1 mg (11/29/2018), 2.1 mg (12/20/2018), 1.4 mg (01/17/2019)  for chemotherapy treatment.    03/30/2019 -  Chemotherapy   The patient had pegfilgrastim (NEULASTA ONPRO KIT) injection 6 mg, 6 mg, Subcutaneous, Once, 5 of 8 cycles Administration: 6 mg (03/30/2019), 6 mg (04/20/2019), 6 mg (05/11/2019), 6 mg (06/22/2019), 6 mg (06/01/2019) ixabepilone (IXEMPRA) 60 mg in lactated ringers 250 mL chemo infusion, 33.5 mg/m2 = 57 mg (100 % of original dose 32 mg/m2), Intravenous,  Once, 5 of  8 cycles Dose modification: 32 mg/m2 (original dose 32 mg/m2, Cycle 1, Reason: Provider Judgment) Administration: 72 mg (04/20/2019), 72 mg (05/11/2019), 72 mg (06/22/2019), 72 mg (06/01/2019)  for chemotherapy treatment.      INTERVAL  HISTORY:  Sarah Horne 63 y.o.  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  Patient is currently status post cycle #5 of Ixempra; chemotherapy is currently on hold because of ongoing radiation to the brain.  No nausea no vomiting.  Appetite is fair.  No weight loss.  Chronic mild tingling and numbness.   Review of Systems  Constitutional: Positive for malaise/fatigue. Negative for chills, diaphoresis, fever and weight loss.  HENT: Negative for nosebleeds and sore throat.   Eyes: Negative for double vision.  Respiratory: Negative for cough, hemoptysis, sputum production, shortness of breath and wheezing.   Cardiovascular: Negative for chest pain, palpitations, orthopnea and leg swelling.  Gastrointestinal: Negative for abdominal pain, blood in stool, constipation, heartburn, melena, nausea and vomiting.  Genitourinary: Negative for dysuria, frequency and urgency.  Musculoskeletal: Positive for joint pain. Negative for back pain.  Skin: Negative for itching.  Neurological: Positive for tingling. Negative for dizziness, focal weakness, weakness and headaches.  Endo/Heme/Allergies: Does not bruise/bleed easily.  Psychiatric/Behavioral: Negative for depression. The patient is not nervous/anxious and does not have insomnia.     PAST MEDICAL HISTORY :  Past Medical History:  Diagnosis Date  . Chemotherapy-induced peripheral neuropathy ([place])   . Epistaxis    a. 11/2016 in setting of asa/plavix-->silver nitrate cauterization.  . GI bleed    a. 11/2016 Admission w/ GIB and hypovolemic shock req 3u PRBC's;  b. 11/2016 ECG: gastritis & nonbleeding peptic ulcer; c. 11/2016 Conlonoscopy: rectal and sigmoid colonic ulcers.  . Heart attack ([place])    a. 1998 Cath @ [facility]: reportedly no intervention required.  Marland Kitchen Herceptin-induced cardiomyopathy ([place])     a. In the setting of Herceptin Rx for breast cancer (initiated 12/2014); b. 03/2015 MUGA EF 64%; b. 08/2015 MUGA: EF 51%; c. 10/2015 MUGA: EF 44%; d. 11/2015 Echo: EF 45-50%; e. 01/2016 MUGA: EF 60%; f. 06/2016 MUGA EF 65%; g. 10/2016 MUGA: EF 61%;  h. 12/2016 Echo: EF 55-60%, gr1 DD.  Marland Kitchen Hyperlipidemia   . Hypertension   . Neuropathy   . Possible PAD (peripheral artery disease) ([place])    a. 11/2016 LE cyanosis and weak pulses-->CTA w/o significant Ao-BiFem dzs. ? distal dzs-->ASA/Plavix initiated by vascular surgery.  Marland Kitchen PSVT (paroxysmal supraventricular tachycardia) ([place])    a. Dx 11/2016.  . Pulmonary embolism ([place])    a. 12/2016 CTA Chest: small nonocclusive PE in inferior segment of the Left lingula, somewhat eccentric filling defect suggesting chronic rather than acute embolic event; b. 01/8526 LE U/S:  No DVT; c. 12/2016 Echo: Nl RV fxn, nl PASP.  Marland Kitchen Recurrent Metastatic breast cancer ([place])    a. Dx 2016: Stage II, ER positive, PR positive, HER-2/neu overexpressing of the left breast-->chemo/radiation; b. 10/2016 CT Abd/pelvis: diffuse liver mets, ill defined sclerotic bone lesions-T12;  c. 10/2016 MRI brain: metastatic lesion along L temporal lobe (19x30m) w/ extensive surrounding edema & 538mmidline shift to right.  . Sinus tachycardia     PAST SURGICAL HISTORY :   Past Surgical History:  Procedure Laterality Date  . BREAST BIOPSY Left 2016   Positive  . BREAST LUMPECTOMY WITH SENTINEL LYMPH NODE BIOPSY Left 05/23/2015   Procedure: LEFT  BREAST WIDE EXCISION WITH AXILLARY DISSECTION, MASTOPLASTY ;  Surgeon: [name], MD;  Location: [facility] ORS;  Service: General;  Laterality: Left;  . BREAST SURGERY Left 12/18/14   breast biopsy/INVASIVE DUCTAL CARCINOMA OF BREAST, NOTTINGHAM GRADE 2.  Marland Kitchen BREAST SURGERY  05/23/2015.   Wide excision/mastoplasty, axillary dissection. No residual invasive cancer, positive for residual DCIS. 0/2 nodes identified on axillary dissection. (no SLN by technetium or  methylene blue)  . CARDIAC CATHETERIZATION    . COLONOSCOPY WITH PROPOFOL N/A 12/10/2016   Procedure: COLONOSCOPY WITH PROPOFOL;  Surgeon: Lucilla Lame, MD;  Location: [facility] ENDOSCOPY;  Service: Endoscopy;  Laterality: N/A;  . COLONOSCOPY WITH PROPOFOL N/A 02/13/2017   Procedure: COLONOSCOPY WITH PROPOFOL;  Surgeon: Lucilla Lame, MD;  Location[facility] ENDOSCOPY;  Service: Endoscopy;  Laterality: N/A;  . ESOPHAGOGASTRODUODENOSCOPY (EGD) WITH PROPOFOL N/A 12/08/2016   Procedure: ESOPHAGOGASTRODUODENOSCOPY (EGD) WITH PROPOFOL;  Surgeon: Lucilla Lame, MD;  Location: [facility] ENDOSCOPY;  Service: Endoscopy;  Laterality: N/A;  . IVC FILTER INSERTION N/A 02/15/2017   Procedure: IVC Filter Insertion;  Surgeon: Algernon Huxley, MD;  Location: [facility] CV LAB;  Service: Cardiovascular;  Laterality: N/A;  . PORTACATH PLACEMENT Right 12-31-14   Dr Bary Castilla    FAMILY HISTORY :   Family History  Problem Relation Age of Onset  . Breast cancer Maternal Aunt   . Breast cancer Cousin   . Brain cancer Maternal Uncle   . Diabetes Mother   . Hypertension Mother   . Stroke Mother     SOCIAL HISTORY:   Social History   Tobacco Use  . Smoking status: Light Tobacco Smoker    Packs/day: 0.50    Years: 18.00    Pack years: 9.00    Types: Cigarettes  . Smokeless tobacco: Never Used  Substance Use Topics  . Alcohol use: No    Alcohol/week: 0.0 standard drinks  . Drug use: No    ALLERGIES:  is allergic to no known allergies.  MEDICATIONS:  Current Outpatient Medications  Medication Sig Dispense Refill  . dexamethasone (DECADRON) 2 MG tablet Take 1 pill BID [morning and afternoon] 45 tablet 0  . flecainide (TAMBOCOR) 50 MG tablet Take 1 tablet (50 mg total) by mouth 2 (two) times daily. 180 tablet 3  . magnesium oxide (MAG-OX) 400 MG tablet Take 1 tablet (400 mg total) by mouth 2 (two) times daily. 60 tablet 5  . pregabalin (LYRICA) 75 MG capsule Take 1 capsule (75 mg total) by mouth 2 (two) times daily. 180  capsule 0  . Vitamin D, Ergocalciferol, (DRISDOL) 1.25 MG (50000 UT) CAPS capsule TAKE 1 CAPSULE (50,000 UNITS TOTAL) ONCE A WEEK BY MOUTH. 12 capsule 1   No current facility-administered medications for this visit.    Facility-Administered Medications Ordered in Other Visits  Medication Dose Route Frequency Provider Last Rate Last Dose  . 0.9 %  sodium chloride infusion   Intravenous Continuous Pandit, Sandeep, MD      . 0.9 %  sodium chloride infusion   Intravenous Continuous Lloyd Huger, MD 999 mL/hr at 04/24/15 1520    . heparin lock flush 100 unit/mL  500 Units Intravenous Once Berenzon, Dmitriy, MD      . sodium chloride 0.9 % injection 10 mL  10 mL Intravenous PRN [name], MD   10 mL at 04/04/15 1440  . sodium chloride flush (NS) 0.9 % injection 10 mL  10 mL Intravenous PRN Roxana Hires, MD        PHYSICAL  EXAMINATION: ECOG PERFORMANCE STATUS: 1 - Symptomatic but completely ambulatory  BP (!) 148/89 (BP Location: Right Arm, Patient Position: Sitting, Cuff Size: Normal)   Pulse 99   Temp (!) 96 F (35.6 C) (Tympanic)   Wt 166 lb (75.3 kg)   BMI 26.79 kg/m   Filed Weights   08/10/19 1044  Weight: 166 lb (75.3 kg)    Physical Exam  Constitutional: She is oriented to person, place, and time and well-developed, well-nourished, and in no distress.  Alone.  Walking herself.  HENT:  Head: Normocephalic and atraumatic.  Mouth/Throat: Oropharynx is clear and moist. No oropharyngeal exudate.  Eyes: Pupils are equal, round, and reactive to light.  Neck: Normal range of motion. Neck supple.  Cardiovascular: Normal rate and regular rhythm.  Pulmonary/Chest: No respiratory distress. She has no wheezes.  Abdominal: Soft. Bowel sounds are normal. She exhibits no distension and no mass. There is no abdominal tenderness. There is no rebound and no guarding.  Musculoskeletal: Normal range of motion.        General: No tenderness or edema.  Neurological: She is alert  and oriented to person, place, and time.  Skin: Skin is warm.  Psychiatric: Affect normal.    LABORATORY DATA:  I have reviewed the data as listed    Component Value Date/Time   NA 137 08/10/2019 1000   NA 135 03/04/2017 0918   NA 135 01/08/2015 0850   K 4.0 08/10/2019 1000   K 3.7 01/08/2015 0850   CL 102 08/10/2019 1000   CL 101 01/08/2015 0850   CO2 26 08/10/2019 1000   CO2 26 01/08/2015 0850   GLUCOSE 133 (H) 08/10/2019 1000   GLUCOSE 161 (H) 01/08/2015 0850   BUN 31 (H) 08/10/2019 1000   BUN 8 03/04/2017 0918   BUN 17 01/08/2015 0850   CREATININE 0.96 08/10/2019 1000   CREATININE 0.82 01/22/2015 1559   CALCIUM 9.1 08/10/2019 1000   CALCIUM 9.3 01/08/2015 0850   PROT 6.8 08/10/2019 1000   PROT 6.4 03/04/2017 0918   PROT 6.8 01/22/2015 1559   ALBUMIN 3.7 08/10/2019 1000   ALBUMIN 2.5 (L) 03/04/2017 0918   ALBUMIN 3.9 01/22/2015 1559   AST 104 (H) 08/10/2019 1000   AST 34 01/22/2015 1559   ALT 300 (H) 08/10/2019 1000   ALT 42 01/22/2015 1559   ALKPHOS 140 (H) 08/10/2019 1000   ALKPHOS 157 (H) 01/22/2015 1559   BILITOT 1.0 08/10/2019 1000   BILITOT 6.2 (H) 03/04/2017 0918   BILITOT 0.2 (L) 01/22/2015 1559   GFRNONAA >60 08/10/2019 1000   GFRNONAA >60 01/22/2015 1559   GFRAA >60 08/10/2019 1000   GFRAA >60 01/22/2015 1559    No results found for: SPEP, UPEP  Lab Results  Component Value Date   WBC 10.3 08/10/2019   NEUTROABS 9.5 (H) 08/10/2019   HGB 14.4 08/10/2019   HCT 41.6 08/10/2019   MCV 92.7 08/10/2019   PLT 175 08/10/2019      Chemistry      Component Value Date/Time   NA 137 08/10/2019 1000   NA 135 03/04/2017 0918   NA 135 01/08/2015 0850   K 4.0 08/10/2019 1000   K 3.7 01/08/2015 0850   CL 102 08/10/2019 1000   CL 101 01/08/2015 0850   CO2 26 08/10/2019 1000   CO2 26 01/08/2015 0850   BUN 31 (H) 08/10/2019 1000   BUN 8 03/04/2017 0918   BUN 17 01/08/2015 0850   CREATININE 0.96 08/10/2019 1000  CREATININE 0.82 01/22/2015 1559       Component Value Date/Time   CALCIUM 9.1 08/10/2019 1000   CALCIUM 9.3 01/08/2015 0850   ALKPHOS 140 (H) 08/10/2019 1000   ALKPHOS 157 (H) 01/22/2015 1559   AST 104 (H) 08/10/2019 1000   AST 34 01/22/2015 1559   ALT 300 (H) 08/10/2019 1000   ALT 42 01/22/2015 1559   BILITOT 1.0 08/10/2019 1000   BILITOT 6.2 (H) 03/04/2017 0918   BILITOT 0.2 (L) 01/22/2015 1559       RADIOGRAPHIC STUDIES: I have personally reviewed the radiological images as listed and agreed with the findings in the report. No results found.   ASSESSMENT & PLAN:  Carcinoma of upper-outer quadrant of left breast in female, estrogen receptor positive ([place]) # Recurrent metastatic breast cancer-ER PR positive HER-2 negative; on Ixempra s/p 4 cycles- SEP 22nd PET scan shows-Partial response with shrinkage solitary left hepatic lobe lesion. May 06, 2019 BRAIN MRI shows at least 5 new lesions [largest 10 mm]-see below.  #Currently chemotherapy is on hold because of ongoing whole brain radiation.  See discussion below regarding elevated LFTs  # Elevated LFTs- ? Etiology; ultrasound liver-inconclusive. Progressive malignancy in the liver is high on the differential; rule out acute hepatitis.  Check hepatitis panel.  # Brain metastases; Recurrent-currently on Re-irradiation-until 11/17.   # Peripheral neuropathy grade 1-  on Lyrica; stable  # DISPOSITION:  # Follow up on 11/30 or dec 1st; MD;labs- cbc/cmp/Ixempra-Dr.B    No orders of the defined types were placed in this encounter.  All questions were answered. The patient knows to call the clinic with any problems, questions or concerns.     Cammie Sickle, MD 08/11/2019 12:45 PM

## 2019-08-11 ENCOUNTER — Other Ambulatory Visit: Payer: Self-pay

## 2019-08-11 ENCOUNTER — Telehealth: Payer: Self-pay | Admitting: Licensed Clinical Social Worker

## 2019-08-11 ENCOUNTER — Ambulatory Visit
Admission: RE | Admit: 2019-08-11 | Discharge: 2019-08-11 | Disposition: A | Discharge status: Home / Self Care | Payer: Medicare HMO | Source: Ambulatory Visit | Attending: Radiation Oncology | Admitting: Radiation Oncology

## 2019-08-11 DIAGNOSIS — C7931 Secondary malignant neoplasm of brain: Secondary | ICD-10-CM | POA: Diagnosis not present

## 2019-08-11 DIAGNOSIS — Z170 Estrogen receptor positive status [ER+]: Secondary | ICD-10-CM | POA: Diagnosis not present

## 2019-08-11 DIAGNOSIS — C50412 Malignant neoplasm of upper-outer quadrant of left female breast: Secondary | ICD-10-CM | POA: Diagnosis not present

## 2019-08-11 LAB — HEPATITIS PANEL, ACUTE
HCV Ab: NONREACTIVE
Hep A IgM: NONREACTIVE
Hep B C IgM: NONREACTIVE
Hepatitis B Surface Ag: NONREACTIVE

## 2019-08-11 NOTE — Telephone Encounter (Signed)
Spoke with patient and let her know that her results were negative for hepatitis.

## 2019-08-11 NOTE — Telephone Encounter (Signed)
-----   Message from Cammie Sickle, MD sent at 08/11/2019 12:45 PM EST ----- Heather/Tamika- please inform pt that her hepatitis panel is negative for infection. Will plan to proceed with chemo as planned.  GB

## 2019-08-14 ENCOUNTER — Telehealth: Payer: Self-pay | Admitting: Internal Medicine

## 2019-08-14 ENCOUNTER — Ambulatory Visit
Admission: RE | Admit: 2019-08-14 | Discharge: 2019-08-14 | Disposition: A | Discharge status: Home / Self Care | Payer: Medicare HMO | Source: Ambulatory Visit | Attending: Radiation Oncology | Admitting: Radiation Oncology

## 2019-08-14 ENCOUNTER — Other Ambulatory Visit: Payer: Self-pay

## 2019-08-14 ENCOUNTER — Ambulatory Visit: Payer: Medicare HMO

## 2019-08-14 DIAGNOSIS — C50412 Malignant neoplasm of upper-outer quadrant of left female breast: Secondary | ICD-10-CM

## 2019-08-14 DIAGNOSIS — R7989 Other specified abnormal findings of blood chemistry: Secondary | ICD-10-CM

## 2019-08-14 DIAGNOSIS — Z170 Estrogen receptor positive status [ER+]: Secondary | ICD-10-CM | POA: Diagnosis not present

## 2019-08-14 DIAGNOSIS — C7931 Secondary malignant neoplasm of brain: Secondary | ICD-10-CM | POA: Diagnosis not present

## 2019-08-14 NOTE — Telephone Encounter (Signed)
Late entry.  Spoke to patient regarding the results of the elevated liver function/normal hepatitis panel.  Question other drugs versus progressive malignancy.  Recommend MR I of the liver.  Will order ASAP.  C- please schedule.   GB

## 2019-08-15 ENCOUNTER — Ambulatory Visit
Admission: RE | Admit: 2019-08-15 | Discharge: 2019-08-15 | Disposition: A | Discharge status: Home / Self Care | Payer: Medicare HMO | Source: Ambulatory Visit | Attending: Radiation Oncology | Admitting: Radiation Oncology

## 2019-08-15 ENCOUNTER — Other Ambulatory Visit: Payer: Self-pay

## 2019-08-15 ENCOUNTER — Ambulatory Visit: Admission: RE | Admit: 2019-08-15 | Payer: Medicare HMO | Source: Ambulatory Visit

## 2019-08-15 ENCOUNTER — Ambulatory Visit: Payer: Medicare HMO

## 2019-08-15 DIAGNOSIS — C50412 Malignant neoplasm of upper-outer quadrant of left female breast: Secondary | ICD-10-CM | POA: Diagnosis not present

## 2019-08-15 DIAGNOSIS — C7931 Secondary malignant neoplasm of brain: Secondary | ICD-10-CM | POA: Diagnosis not present

## 2019-08-15 DIAGNOSIS — Z170 Estrogen receptor positive status [ER+]: Secondary | ICD-10-CM | POA: Diagnosis not present

## 2019-08-16 ENCOUNTER — Ambulatory Visit
Admission: RE | Admit: 2019-08-16 | Discharge: 2019-08-16 | Disposition: A | Discharge status: Home / Self Care | Payer: Medicare HMO | Source: Ambulatory Visit | Attending: Internal Medicine | Admitting: Internal Medicine

## 2019-08-16 ENCOUNTER — Other Ambulatory Visit: Payer: Self-pay | Admitting: Internal Medicine

## 2019-08-16 ENCOUNTER — Other Ambulatory Visit: Payer: Self-pay | Admitting: *Deleted

## 2019-08-16 ENCOUNTER — Ambulatory Visit: Payer: Medicare HMO

## 2019-08-16 DIAGNOSIS — C50412 Malignant neoplasm of upper-outer quadrant of left female breast: Secondary | ICD-10-CM

## 2019-08-16 DIAGNOSIS — Z170 Estrogen receptor positive status [ER+]: Secondary | ICD-10-CM | POA: Insufficient documentation

## 2019-08-16 DIAGNOSIS — C787 Secondary malignant neoplasm of liver and intrahepatic bile duct: Secondary | ICD-10-CM | POA: Diagnosis not present

## 2019-08-16 DIAGNOSIS — C50919 Malignant neoplasm of unspecified site of unspecified female breast: Secondary | ICD-10-CM | POA: Diagnosis not present

## 2019-08-16 DIAGNOSIS — R7989 Other specified abnormal findings of blood chemistry: Secondary | ICD-10-CM | POA: Diagnosis not present

## 2019-08-16 MED ORDER — GADOBUTROL 1 MMOL/ML IV SOLN
7.0000 mL | Freq: Once | INTRAVENOUS | Status: AC | PRN
  Administered 2019-08-16: 7 mL via INTRAVENOUS

## 2019-08-16 NOTE — Progress Notes (Signed)
DISCONTINUE ON PATHWAY REGIMEN - Breast     A cycle is every 21 days:     Ixabepilone   **Always confirm dose/schedule in your pharmacy ordering system**  REASON: Disease Progression PRIOR TREATMENT: [id number]: Ixabepilone 40 mg/m2 q21 Days TREATMENT RESPONSE: Progressive Disease (PD)  START ON PATHWAY REGIMEN - Breast     A cycle is every 28 days (3 weeks on and 1 week off):     Nab-paclitaxel (protein bound)   **Always confirm dose/schedule in your pharmacy ordering system**  Patient Characteristics: Distant Metastases or Locoregional Recurrent Disease - Unresected or Locally Advanced Unresectable Disease Progressing after Neoadjuvant and Local Therapies, HER2 Negative/Unknown/Equivocal, ER Positive, Chemotherapy, Third Line and Beyond, Prior or  Contraindicated Anthracycline and Prior Eribulin Therapeutic Status: Distant Metastases BRCA Mutation Status: Absent ER Status: Positive (+) HER2 Status: Negative (-) PR Status: Negative (-) Line of Therapy: Third Line and Beyond Intent of Therapy: Non-Curative / Palliative Intent, Discussed with Patient

## 2019-08-17 ENCOUNTER — Ambulatory Visit: Payer: Medicare HMO

## 2019-08-17 ENCOUNTER — Other Ambulatory Visit: Payer: Self-pay | Admitting: Internal Medicine

## 2019-08-18 ENCOUNTER — Inpatient Hospital Stay (HOSPITAL_BASED_OUTPATIENT_CLINIC_OR_DEPARTMENT_OTHER): Payer: Medicare HMO | Admitting: Internal Medicine

## 2019-08-18 ENCOUNTER — Inpatient Hospital Stay: Payer: Medicare HMO

## 2019-08-18 ENCOUNTER — Other Ambulatory Visit: Payer: Self-pay

## 2019-08-18 ENCOUNTER — Ambulatory Visit: Payer: Medicare HMO

## 2019-08-18 VITALS — BP 128/89 | HR 82 | Resp 18

## 2019-08-18 DIAGNOSIS — Z7189 Other specified counseling: Secondary | ICD-10-CM

## 2019-08-18 DIAGNOSIS — Z515 Encounter for palliative care: Secondary | ICD-10-CM | POA: Diagnosis not present

## 2019-08-18 DIAGNOSIS — C50412 Malignant neoplasm of upper-outer quadrant of left female breast: Secondary | ICD-10-CM

## 2019-08-18 DIAGNOSIS — C7931 Secondary malignant neoplasm of brain: Secondary | ICD-10-CM | POA: Diagnosis not present

## 2019-08-18 DIAGNOSIS — Z170 Estrogen receptor positive status [ER+]: Secondary | ICD-10-CM | POA: Diagnosis not present

## 2019-08-18 DIAGNOSIS — R7989 Other specified abnormal findings of blood chemistry: Secondary | ICD-10-CM | POA: Diagnosis not present

## 2019-08-18 DIAGNOSIS — R5383 Other fatigue: Secondary | ICD-10-CM | POA: Diagnosis not present

## 2019-08-18 DIAGNOSIS — Z86711 Personal history of pulmonary embolism: Secondary | ICD-10-CM | POA: Diagnosis not present

## 2019-08-18 DIAGNOSIS — C787 Secondary malignant neoplasm of liver and intrahepatic bile duct: Secondary | ICD-10-CM | POA: Diagnosis not present

## 2019-08-18 DIAGNOSIS — Z5111 Encounter for antineoplastic chemotherapy: Secondary | ICD-10-CM | POA: Diagnosis not present

## 2019-08-18 LAB — COMPREHENSIVE METABOLIC PANEL
ALT: 121 U/L — ABNORMAL HIGH (ref 0–44)
AST: 70 U/L — ABNORMAL HIGH (ref 15–41)
Albumin: 3.5 g/dL (ref 3.5–5.0)
Alkaline Phosphatase: 166 U/L — ABNORMAL HIGH (ref 38–126)
Anion gap: 5 (ref 5–15)
BUN: 13 mg/dL (ref 8–23)
CO2: 27 mmol/L (ref 22–32)
Calcium: 8.5 mg/dL — ABNORMAL LOW (ref 8.9–10.3)
Chloride: 105 mmol/L (ref 98–111)
Creatinine, Ser: 0.83 mg/dL (ref 0.44–1.00)
GFR calc Af Amer: >60 mL/min (ref >60)
GFR calc non Af Amer: >60 mL/min (ref >60)
Glucose, Bld: 88 mg/dL (ref 70–99)
Potassium: 4 mmol/L (ref 3.5–5.1)
Sodium: 137 mmol/L (ref 135–145)
Total Bilirubin: 1.3 mg/dL — ABNORMAL HIGH (ref 0.3–1.2)
Total Protein: 6.9 g/dL (ref 6.5–8.1)

## 2019-08-18 LAB — CBC WITH DIFFERENTIAL/PLATELET
Abs Immature Granulocytes: 0.01 10*3/uL (ref 0.00–0.07)
Basophils Absolute: 0 10*3/uL (ref 0.0–0.1)
Basophils Relative: 0 %
Eosinophils Absolute: 0.1 10*3/uL (ref 0.0–0.5)
Eosinophils Relative: 2 %
HCT: 41.1 % (ref 36.0–46.0)
Hemoglobin: 13.9 g/dL (ref 12.0–15.0)
Immature Granulocytes: 0 %
Lymphocytes Relative: 21 %
Lymphs Abs: 0.6 10*3/uL — ABNORMAL LOW (ref 0.7–4.0)
MCH: 31.8 pg (ref 26.0–34.0)
MCHC: 33.8 g/dL (ref 30.0–36.0)
MCV: 94.1 fL (ref 80.0–100.0)
Monocytes Absolute: 0.3 10*3/uL (ref 0.1–1.0)
Monocytes Relative: 9 %
Neutro Abs: 2.1 10*3/uL (ref 1.7–7.7)
Neutrophils Relative %: 68 %
Platelets: 148 10*3/uL — ABNORMAL LOW (ref 150–400)
RBC: 4.37 MIL/uL (ref 3.87–5.11)
RDW: 14.5 % (ref 11.5–15.5)
WBC: 3 10*3/uL — ABNORMAL LOW (ref 4.0–10.5)
nRBC: 0 % (ref 0.0–0.2)

## 2019-08-18 MED ORDER — DIPHENHYDRAMINE HCL 50 MG/ML IJ SOLN
50.0000 mg | Freq: Once | INTRAMUSCULAR | Status: AC
  Administered 2019-08-18: 50 mg via INTRAVENOUS
  Filled 2019-08-18: qty 1

## 2019-08-18 MED ORDER — HEPARIN SOD (PORK) LOCK FLUSH 100 UNIT/ML IV SOLN
500.0000 [IU] | Freq: Once | INTRAVENOUS | Status: AC
  Administered 2019-08-18: 500 [IU] via INTRAVENOUS
  Filled 2019-08-18: qty 5

## 2019-08-18 MED ORDER — SODIUM CHLORIDE 0.9 % IV SOLN
80.0000 mg/m2 | Freq: Once | INTRAVENOUS | Status: AC
  Administered 2019-08-18: 150 mg via INTRAVENOUS
  Filled 2019-08-18: qty 25

## 2019-08-18 MED ORDER — DEXAMETHASONE SODIUM PHOSPHATE 10 MG/ML IJ SOLN
10.0000 mg | Freq: Once | INTRAMUSCULAR | Status: AC
  Administered 2019-08-18: 10 mg via INTRAVENOUS
  Filled 2019-08-18: qty 1

## 2019-08-18 MED ORDER — SODIUM CHLORIDE 0.9 % IV SOLN
Freq: Once | INTRAVENOUS | Status: AC
  Administered 2019-08-18: 10:00:00 via INTRAVENOUS
  Filled 2019-08-18: qty 250

## 2019-08-18 MED ORDER — SODIUM CHLORIDE 0.9 % IV SOLN: 10.0000 mg | Freq: Once | INTRAVENOUS | Status: DC

## 2019-08-18 MED ORDER — FAMOTIDINE IN NACL 20-0.9 MG/50ML-% IV SOLN
20.0000 mg | Freq: Once | INTRAVENOUS | Status: AC
  Administered 2019-08-18: 20 mg via INTRAVENOUS
  Filled 2019-08-18: qty 50

## 2019-08-18 MED ORDER — SODIUM CHLORIDE 0.9% FLUSH
10.0000 mL | INTRAVENOUS | Status: DC | PRN
  Administered 2019-08-18: 10 mL via INTRAVENOUS
  Filled 2019-08-18: qty 10

## 2019-08-18 NOTE — Progress Notes (Signed)
[facility] OFFICE PROGRESS NOTE  Patient Care Team: Lada, Satira Anis, MD as PCP - General (Family Medicine) Wellington Hampshire, MD as PCP - Cardiology (Cardiology) Byrnett, Forest Gleason, MD (General Surgery) [name], MD (Inactive) as Attending Physician (Internal Medicine)  Cancer Staging No matching staging information was found for the patient.   Oncology History Overview Note  # LEFT BREAST IDC; STAGE II [cT2N1] ER >90%; PR- 50-90%; her 2 NEU- POS; s/p Neoadj chemo; AUG 2016- TCH+P s/p Lumpec & partial ALND- path CR; s/p RT [finished Nov 2016];  adj Herceptin; HELD for Jan 19th 2017 [in DEC 2016-EF dropped from 63 to 51%; FEB 27th EF-42.9%]; JAN 2016 START Arimidex; May 2017- EF- 60%; May 24th 2017-Re-start Herceptin q 3W; July 28th STOP herceptin [finished 9m/m2; sec to Low EF; however 2017 Oct EF= 67%; improved]  # DEC 4th 2017- Start Neratinib 4 pills; DEC 11th 3 pills; STOPPED.  # FEB 2018- RECURRENCE ER/PR positive; Her 2 NEGATIVE [liver Bx]  # MARCH 1st 2018- Tax-Cytoxan [poor tol]  # FEB 2018- Brain mets [SBRT; Dr.Crystal; finished Feb 28th 2018]  # March 2018- Eribulin- poor tolerance  # June 8th 2018- Started Faslodex + Abema [abema-multiple interruptions sec to neutropenia]  # MRI Brain- March 2019- Leptomeningeal mets [WBRT; No LP s/p WBRT- 01/09/2018]; STOP Abema [545mday-sec to severe cytopenia]+faslodex  # MAY 20tth 019- Start Xeloda; STOPPED in Nov 22nd 2019- sec to Progressive liver lesions on CT scan  # NOV 26th 2019- Faslodex + Piqaray 25071m; Jan 2020- Piqray 300 mg+ faslodex; FEB 17th CT scan- mixed response/ Overall progression; STOP Piqray + faslodex.   # FEB 25th 2020- ERIBULIN; Poor tolerance sec to severe neutropenia.  Stopped May 2020.  CT scan May 2020-new/progressive 2 cm liver lesion;   #July nd 2020-Ixempra x5 cycles-; NOV 2020-MRI liver- worsening liver lesions [rising AST/ALT].   # NOV [id number] weekly;   #May 06, 2019-MRI brain 5 new lesions [3 mm to 10 mm]-asymptomatic; [previous whole brain radiation]- OCT 2020- Re-Irradiation.   # PN- G-2; may 2017- Cymbalta 28m47mMARCH 2018-  acute vascular insuff of BIL LE [? Taxotere vasoconstriction on asprin]; # hemorragic shock LGIB [s/p colo; Dr.Wohl]  #  SVT- on flecainide.   # April 2016- Liver Bx- NEG;   # Drop in EF from Herceptin [recovered OCt 27th- EF 65%]  # BMD- jan 2017- osteopenia  # 2020- SEP-s/p Pallaitive care evaluation  -----------------------------------------------------------     MOLEEl Dara- Sep 4th 2018- PDL-1 0%/NEG for BRCA; MSI-Stable; Positive for PI3K; ccnd-1; FGF** -----------------------------------------------------------  Dx: Metastatic Breast cancer [ER/PR-Pos; her -2 NEG] Stage IV; goals: palliative Current treatment- Taxol   Carcinoma of upper-outer quadrant of left breast in female, estrogen receptor positive (HCC)[place]/25/2020 - 01/17/2019 Chemotherapy   The patient had eriBULin mesylate (HALAVEN) 2.1 mg in sodium chloride 0.9 % 100 mL chemo infusion, 1.2 mg/m2 = 2.1 mg (100 % of original dose 1.2 mg/m2), Intravenous,  Once, 3 of 4 cycles Dose modification: 1.2 mg/m2 (original dose 1.2 mg/m2, Cycle 1, Reason: Provider Judgment), 0.8 mg/m2 (original dose 1.2 mg/m2, Cycle 3, Reason: Provider Judgment) Administration: 2.1 mg (11/22/2018), 2.1 mg (11/29/2018), 2.1 mg (12/20/2018), 1.4 mg (01/17/2019)  for chemotherapy treatment.    03/30/2019 - 07/12/2019 Chemotherapy   The patient had pegfilgrastim (NEULASTA ONPRO KIT) injection 6 mg, 6 mg, Subcutaneous, Once, 5 of 8 cycles Administration: 6 mg (03/30/2019), 6 mg (04/20/2019), 6 mg (05/11/2019), 6 mg (06/22/2019), 6 mg (  06/01/2019) ixabepilone (IXEMPRA) 60 mg in lactated ringers 250 mL chemo infusion, 33.5 mg/m2 = 57 mg (100 % of original dose 32 mg/m2), Intravenous,  Once, 5 of 8 cycles Dose modification: 32 mg/m2 (original dose 32 mg/m2, Cycle 1, Reason:  Provider Judgment) Administration: 72 mg (04/20/2019), 72 mg (05/11/2019), 72 mg (06/22/2019), 72 mg (06/01/2019)  for chemotherapy treatment.    08/18/2019 -  Chemotherapy   The patient had PACLitaxel (TAXOL) 150 mg in sodium chloride 0.9 % 250 mL chemo infusion (</= 42m/m2), 80 mg/m2 = 150 mg, Intravenous,  Once, 1 of 4 cycles  for chemotherapy treatment.      INTERVAL HISTORY:  Sarah MONGIELLO89y.o.  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  Patient denies any abdominal pain nausea vomiting.  Denies any chest pain or shortness of breath cough.  Denies any palpitations.  Appetite is fair.  No weight loss.   Review of Systems  Constitutional: Positive for malaise/fatigue. Negative for chills, diaphoresis, fever and weight loss.  HENT: Negative for nosebleeds and sore throat.   Eyes: Negative for double vision.  Respiratory: Negative for cough, hemoptysis, sputum production, shortness of breath and wheezing.   Cardiovascular: Negative for chest pain, palpitations, orthopnea and leg swelling.  Gastrointestinal: Negative for abdominal pain, blood in stool, constipation, heartburn, melena, nausea and vomiting.  Genitourinary: Negative for dysuria, frequency and urgency.  Musculoskeletal: Positive for joint pain. Negative for back pain.  Skin: Negative for itching.  Neurological: Positive for tingling. Negative for dizziness, focal weakness, weakness and headaches.  Endo/Heme/Allergies: Does not bruise/bleed easily.  Psychiatric/Behavioral: Negative for depression. The patient is not nervous/anxious and does not have insomnia.     PAST MEDICAL HISTORY :  Past Medical History:  Diagnosis Date  . Chemotherapy-induced peripheral neuropathy ([place]   . Epistaxis    a. 11/2016 in setting of asa/plavix-->silver nitrate  cauterization.  . GI bleed    a. 11/2016 Admission w/ GIB and hypovolemic shock req 3u PRBC's;  b. 11/2016 ECG: gastritis & nonbleeding peptic ulcer; c. 11/2016 Conlonoscopy: rectal and sigmoid colonic ulcers.  . Heart attack ([facility]    a. 1998 Cath @ [facility]: reportedly no intervention required.  .Marland KitchenHerceptin-induced cardiomyopathy ([place]    a. In the setting of Herceptin Rx for breast cancer (initiated 12/2014); b. 03/2015 MUGA EF 64%; b. 08/2015 MUGA: EF 51%; c. 10/2015 MUGA: EF 44%; d. 11/2015 Echo: EF 45-50%; e. 01/2016 MUGA: EF 60%; f. 06/2016 MUGA EF 65%; g. 10/2016 MUGA: EF 61%;  h. 12/2016 Echo: EF 55-60%, gr1 DD.  .Marland KitchenHyperlipidemia   . Hypertension   . Neuropathy   . Possible PAD (peripheral artery disease) ([place]    a. 11/2016 LE cyanosis and weak pulses-->CTA w/o significant Ao-BiFem dzs. ? distal dzs-->ASA/Plavix initiated by vascular surgery.  .Marland KitchenPSVT (paroxysmal supraventricular tachycardia) ([facility]    a. Dx 11/2016.  . Pulmonary embolism ([place]    a. 12/2016 CTA Chest: small nonocclusive PE in inferior segment of the Left lingula, somewhat eccentric filling defect suggesting chronic rather than acute embolic event; b. 48/6767LE U/S:  No DVT; c. 12/2016 Echo: Nl RV fxn, nl PASP.  .Marland KitchenRecurrent Metastatic breast cancer (HLew[facility]    a. Dx 2016: Stage II, ER positive, PR positive, HER-2/neu overexpressing of the left breast-->chemo/radiation; b. 10/2016 CT Abd/pelvis: diffuse liver mets, ill defined sclerotic  bone lesions-T12;  c. 10/2016 MRI brain: metastatic lesion along L temporal lobe (19x40m) w/ extensive surrounding edema & 527mmidline shift to right.  . Sinus tachycardia     PAST SURGICAL HISTORY :   Past Surgical History:  Procedure Laterality Date  . BREAST BIOPSY Left 2016   Positive  . BREAST LUMPECTOMY WITH SENTINEL LYMPH NODE BIOPSY Left 05/23/2015   Procedure: LEFT BREAST WIDE EXCISION WITH AXILLARY DISSECTION, MASTOPLASTY ;  Surgeon: JeRobert BellowMD;  Location: [facility] ORS;  Service: General;   Laterality: Left;  . BREAST SURGERY Left 12/18/14   breast biopsy/INVASIVE DUCTAL CARCINOMA OF BREAST, NOTTINGHAM GRADE 2.  . Marland KitchenREAST SURGERY  05/23/2015.   Wide excision/mastoplasty, axillary dissection. No residual invasive cancer, positive for residual DCIS. 0/2 nodes identified on axillary dissection. (no SLN by technetium or methylene blue)  . CARDIAC CATHETERIZATION    . COLONOSCOPY WITH PROPOFOL N/A 12/10/2016   Procedure: COLONOSCOPY WITH PROPOFOL;  Surgeon: DaLucilla LameMD;  Location: [facility] ENDOSCOPY;  Service: Endoscopy;  Laterality: N/A;  . COLONOSCOPY WITH PROPOFOL N/A 02/13/2017   Procedure: COLONOSCOPY WITH PROPOFOL;  Surgeon: WoLucilla LameMD;  [facility];  Service: Endoscopy;  Laterality: N/A;  . ESOPHAGOGASTRODUODENOSCOPY (EGD) WITH PROPOFOL N/A 12/08/2016   Procedure: ESOPHAGOGASTRODUODENOSCOPY (EGD) WITH PROPOFOL;  Surgeon: DaLucilla LameMD;  Location: [facility] END[facility];  Service: Endoscopy;  Laterality: N/A;  . IVC FILTER INSERTION N/A 02/15/2017   Procedure: IVC Filter Insertion;  Surgeon: DeAlgernon HuxleyMD;  [facility];  Service: Cardiovascular;  Laterality: N/A;  . PORTACATH PLACEMENT Right 12-31-14   Dr ByBary Castilla  FAMILY HISTORY :   Family History  Problem Relation Age of Onset  . Breast cancer Maternal Aunt   . Breast cancer Cousin   . Brain cancer Maternal Uncle   . Diabetes Mother   . Hypertension Mother   . Stroke Mother     SOCIAL HISTORY:   Social History   Tobacco Use  . Smoking status: Light Tobacco Smoker    Packs/day: 0.50    Years: 18.00    Pack years: 9.00    Types: Cigarettes  . Smokeless tobacco: Never Used  Substance Use Topics  . Alcohol use: No    Alcohol/week: 0.0 standard drinks  . Drug use: No    ALLERGIES:  is allergic to no known allergies.  MEDICATIONS:  Current Outpatient Medications  Medication Sig Dispense Refill  . dexamethasone (DECADRON) 2 MG tablet Take 1 pill BID [morning and afternoon] 45  tablet 0  . flecainide (TAMBOCOR) 50 MG tablet Take 1 tablet (50 mg total) by mouth 2 (two) times daily. 180 tablet 3  . magnesium oxide (MAG-OX) 400 MG tablet Take 1 tablet (400 mg total) by mouth 2 (two) times daily. 60 tablet 5  . pregabalin (LYRICA) 75 MG capsule Take 1 capsule (75 mg total) by mouth 2 (two) times daily. 180 capsule 0  . Vitamin D, Ergocalciferol, (DRISDOL) 1.25 MG (50000 UT) CAPS capsule TAKE 1 CAPSULE (50,000 UNITS TOTAL) ONCE A WEEK BY MOUTH. 12 capsule 1   No current facility-administered medications for this visit.    Facility-Administered Medications Ordered in Other Visits  Medication Dose Route Frequency Provider Last Rate Last Dose  . 0.9 %  sodium chloride infusion   Intravenous Continuous Pandit, Sandeep, MD      . 0.9 %  sodium chloride infusion   Intravenous Continuous FiLloyd HugerMD 999 mL/hr at 04/24/15 1520    .  heparin lock flush 100 unit/mL  500 Units Intravenous Once Berenzon, Dmitriy, MD      . heparin lock flush 100 unit/mL  500 Units Intravenous Once Charlaine Dalton R, MD      . PACLitaxel (TAXOL) 150 mg in sodium chloride 0.9 % 250 mL chemo infusion (</= 10m/m2)  80 mg/m2 (Treatment Plan Recorded) Intravenous Once BCammie Sickle MD 275 mL/hr at 08/18/19 1129 150 mg at 08/18/19 1129  . sodium chloride 0.9 % injection 10 mL  10 mL Intravenous PRN PLeia Alf MD   10 mL at 04/04/15 1440  . sodium chloride flush (NS) 0.9 % injection 10 mL  10 mL Intravenous PRN Berenzon, Dmitriy, MD      . sodium chloride flush (NS) 0.9 % injection 10 mL  10 mL Intravenous PRN BCammie Sickle MD   10 mL at 08/18/19 0843    PHYSICAL EXAMINATION: ECOG PERFORMANCE STATUS: 1 - Symptomatic but completely ambulatory  BP (!) 148/109 (BP Location: Right Arm, Patient Position: Sitting, Cuff Size: Normal)   Pulse (!) 113   Temp 98.7 F (37.1 C) (Tympanic)   Wt 163 lb (73.9 kg)   BMI 26.31 kg/m   Filed Weights   08/18/19 0919  Weight: 163  lb (73.9 kg)    Physical Exam  Constitutional: She is oriented to person, place, and time and well-developed, well-nourished, and in no distress.  Alone.  Walking herself.  HENT:  Head: Normocephalic and atraumatic.  Mouth/Throat: Oropharynx is clear and moist. No oropharyngeal exudate.  Eyes: Pupils are equal, round, and reactive to light.  Neck: Normal range of motion. Neck supple.  Cardiovascular: Normal rate and regular rhythm.  Pulmonary/Chest: No respiratory distress. She has no wheezes.  Abdominal: Soft. Bowel sounds are normal. She exhibits no distension and no mass. There is no abdominal tenderness. There is no rebound and no guarding.  Musculoskeletal: Normal range of motion.        General: No tenderness or edema.  Neurological: She is alert and oriented to person, place, and time.  Skin: Skin is warm.  Psychiatric: Affect normal.    LABORATORY DATA:  I have reviewed the data as listed    Component Value Date/Time   NA 137 08/18/2019 0836   NA 135 03/04/2017 0918   NA 135 01/08/2015 0850   K 4.0 08/18/2019 0836   K 3.7 01/08/2015 0850   CL 105 08/18/2019 0836   CL 101 01/08/2015 0850   CO2 27 08/18/2019 0836   CO2 26 01/08/2015 0850   GLUCOSE 88 08/18/2019 0836   GLUCOSE 161 (H) 01/08/2015 0850   BUN 13 08/18/2019 0836   BUN 8 03/04/2017 0918   BUN 17 01/08/2015 0850   CREATININE 0.83 08/18/2019 0836   CREATININE 0.82 01/22/2015 1559   CALCIUM 8.5 (L) 08/18/2019 0836   CALCIUM 9.3 01/08/2015 0850   PROT 6.9 08/18/2019 0836   PROT 6.4 03/04/2017 0918   PROT 6.8 01/22/2015 1559   ALBUMIN 3.5 08/18/2019 0836   ALBUMIN 2.5 (L) 03/04/2017 0918   ALBUMIN 3.9 01/22/2015 1559   AST 70 (H) 08/18/2019 0836   AST 34 01/22/2015 1559   ALT 121 (H) 08/18/2019 0836   ALT 42 01/22/2015 1559   ALKPHOS 166 (H) 08/18/2019 0836   ALKPHOS 157 (H) 01/22/2015 1559   BILITOT 1.3 (H) 08/18/2019 0836   BILITOT 6.2 (H) 03/04/2017 0918   BILITOT 0.2 (L) 01/22/2015 1559    GFRNONAA >60 08/18/2019 0836   GFRNONAA >60 01/22/2015  [place] 08/18/2019 0836   GFRAA >60 01/22/2015 1559    No results found for: SPEP, UPEP  Lab Results  Component Value Date   WBC 3.0 (L) 08/18/2019   NEUTROABS 2.1 08/18/2019   HGB 13.9 08/18/2019   HCT 41.1 08/18/2019   MCV 94.1 08/18/2019   PLT 148 (L) 08/18/2019      Chemistry      Component Value Date/Time   NA 137 08/18/2019 0836   NA 135 03/04/2017 0918   NA 135 01/08/2015 0850   K 4.0 08/18/2019 0836   K 3.7 01/08/2015 0850   CL 105 08/18/2019 0836   CL 101 01/08/2015 0850   CO2 27 08/18/2019 0836   CO2 26 01/08/2015 0850   BUN 13 08/18/2019 0836   BUN 8 03/04/2017 0918   BUN 17 01/08/2015 0850   CREATININE 0.83 08/18/2019 0836   CREATININE 0.82 01/22/2015 1559      Component Value Date/Time   CALCIUM 8.5 (L) 08/18/2019 0836   CALCIUM 9.3 01/08/2015 0850   ALKPHOS 166 (H) 08/18/2019 0836   ALKPHOS 157 (H) 01/22/2015 1559   AST 70 (H) 08/18/2019 0836   AST 34 01/22/2015 1559   ALT 121 (H) 08/18/2019 0836   ALT 42 01/22/2015 1559   BILITOT 1.3 (H) 08/18/2019 0836   BILITOT 6.2 (H) 03/04/2017 0918   BILITOT 0.2 (L) 01/22/2015 1559       RADIOGRAPHIC STUDIES: I have personally reviewed the radiological images as listed and agreed with the findings in the report. No results found.   ASSESSMENT & PLAN:  Carcinoma of upper-outer quadrant of left breast in female, estrogen receptor positive ([place]) # Recurrent metastatic breast cancer-ER PR positive HER-2 negative; on Ixempra s/p 4 cycles; NOV 2020- PROGRESSION in liver- multiple enhancing lesions noted.   # Discontinue Ixempra; Proceed with weekly Taxol. Labs today reviewed;  acceptable for treatment today- except for-elevated LFTs [see below]. Discussed acute infusion reactions/ PN/hair loss.   # Elevated LFTs- ? Etiology sec to underlying cancer; see above.   # Brain metastases;  May 06, 2019 BRAIN MRI shows at least 5 new lesions  [largest 10 mm]-Recurrent-currently on Re-irradiation-until 11/17.  Will repeat MRI in 1-2 months.   # Peripheral neuropathy grade 1-2-  on Lyrica;STABLE; monitor for now while on Taxol.  Understands that neuropathy might get worse on Taxol.  # DISPOSITION:  # Taxol today # Nov 30th- MD; labs- cbc/cmp; Taxol- Dr.B  Orders Placed This Encounter  Procedures  . CBC with Differential    Standing Status:   Future    Standing Expiration Date:   08/17/2020  . Comprehensive metabolic panel    Standing Status:   Future    Standing Expiration Date:   08/17/2020   All questions were answered. The patient knows to call the clinic with any problems, questions or concerns.     Cammie Sickle, MD 08/18/2019 11:47 AM

## 2019-08-18 NOTE — Assessment & Plan Note (Addendum)
#  Recurrent metastatic breast cancer-ER PR positive HER-2 negative; on Ixempra s/p 4 cycles; NOV 2020- PROGRESSION in liver- multiple enhancing lesions noted.   # Discontinue Ixempra; Proceed with weekly Taxol. Labs today reviewed;  acceptable for treatment today- except for-elevated LFTs [see below]. Discussed acute infusion reactions/ PN/hair loss.  Patient understands treatments are palliative not curative.  # Elevated LFTs- ? Etiology sec to underlying cancer; hepatitis panel normal.  See above.   # Brain metastases;  May 06, 2019 BRAIN MRI shows at least 5 new lesions [largest 10 mm]-Recurrent-currently on Re-irradiation-until 11/17.  Will repeat MRI in 1-2 months.   # Peripheral neuropathy grade 1-2-  on Lyrica;STABLE; monitor for now while on Taxol.  Understands that neuropathy might get worse on Taxol.  # DISPOSITION:  # Taxol today # Nov 30th- MD; labs- cbc/cmp; Taxol- Dr.B

## 2019-08-21 ENCOUNTER — Ambulatory Visit: Payer: Medicare HMO

## 2019-08-22 ENCOUNTER — Ambulatory Visit: Payer: Medicare HMO

## 2019-08-28 ENCOUNTER — Other Ambulatory Visit: Payer: Medicare HMO

## 2019-08-28 ENCOUNTER — Inpatient Hospital Stay (HOSPITAL_BASED_OUTPATIENT_CLINIC_OR_DEPARTMENT_OTHER): Payer: Medicare HMO | Admitting: Hospice and Palliative Medicine

## 2019-08-28 ENCOUNTER — Ambulatory Visit: Payer: Medicare HMO

## 2019-08-28 ENCOUNTER — Other Ambulatory Visit: Payer: Self-pay

## 2019-08-28 ENCOUNTER — Ambulatory Visit: Payer: Medicare HMO | Admitting: Internal Medicine

## 2019-08-28 DIAGNOSIS — C50412 Malignant neoplasm of upper-outer quadrant of left female breast: Secondary | ICD-10-CM

## 2019-08-28 DIAGNOSIS — Z515 Encounter for palliative care: Secondary | ICD-10-CM

## 2019-08-28 DIAGNOSIS — Z170 Estrogen receptor positive status [ER+]: Secondary | ICD-10-CM

## 2019-08-28 NOTE — Progress Notes (Signed)
Virtual Visit via Telephone Note  I connected with Sarah Horne on 08/28/19 at  9:30 AM EST by telephone and verified that I am speaking with the correct person using two identifiers.   I discussed the limitations, risks, security and privacy concerns of performing an evaluation and management service by telephone and the availability of in person appointments. I also discussed with the patient that there may be a patient responsible charge related to this service. The patient expressed understanding and agreed to proceed.   History of Present Illness: Palliative Care consult requested for this 63 y.o. female with multiple medical problems including stage IV breast cancer with leptomeningeal and liver metastases who has had disease progression on multiple previous lines of treatment.  MRI of the brain on 05/06/2019 shows a several new metastatic lesions.  MRI of the brain on 07/06/2019 also showed evidence of disease progression with enlarging existing lesions and note of interval new metastatic lesions.  Patient was referred to palliative care to help address goals and manage ongoing symptoms.   Observations/Objective: I called and spoke with patient by phone. Patient reports that she is doing reasonably well. She denies any acute changes or concerns. She denies any uncontrolled symptoms at present. She says pain is stable. No questions or concerns. She did not require any refills on medications. Patient resumes treatment with chemotherapy on 12/1.  Patient reports improved appetite and fatigue.  Assessment and Plan: Stage IV breast cancer -patient is status post RT now on chemotherapy with paclitaxel with next dose scheduled for 12/1. Patient will see Dr. Rogue Bussing tomorrow.  Follow Up Instructions: Follow-up telephone visit and 2 to 3 weeks   I discussed the assessment and treatment plan with the patient. The patient was provided an opportunity to ask questions and all were answered. The  patient agreed with the plan and demonstrated an understanding of the instructions.   The patient was advised to call back or seek an in-person evaluation if the symptoms worsen or if the condition fails to improve as anticipated.  I provided 5 minutes of non-face-to-face time during this encounter.   Irean Hong, NP

## 2019-08-29 ENCOUNTER — Inpatient Hospital Stay: Payer: Medicare HMO | Attending: Internal Medicine | Admitting: *Deleted

## 2019-08-29 ENCOUNTER — Inpatient Hospital Stay: Payer: Medicare HMO

## 2019-08-29 ENCOUNTER — Other Ambulatory Visit: Payer: Self-pay

## 2019-08-29 ENCOUNTER — Inpatient Hospital Stay (HOSPITAL_BASED_OUTPATIENT_CLINIC_OR_DEPARTMENT_OTHER): Payer: Medicare HMO | Admitting: Internal Medicine

## 2019-08-29 DIAGNOSIS — Z9221 Personal history of antineoplastic chemotherapy: Secondary | ICD-10-CM | POA: Insufficient documentation

## 2019-08-29 DIAGNOSIS — Z86711 Personal history of pulmonary embolism: Secondary | ICD-10-CM | POA: Insufficient documentation

## 2019-08-29 DIAGNOSIS — C787 Secondary malignant neoplasm of liver and intrahepatic bile duct: Secondary | ICD-10-CM | POA: Diagnosis not present

## 2019-08-29 DIAGNOSIS — F17210 Nicotine dependence, cigarettes, uncomplicated: Secondary | ICD-10-CM | POA: Diagnosis not present

## 2019-08-29 DIAGNOSIS — Z7902 Long term (current) use of antithrombotics/antiplatelets: Secondary | ICD-10-CM | POA: Diagnosis not present

## 2019-08-29 DIAGNOSIS — M8580 Other specified disorders of bone density and structure, unspecified site: Secondary | ICD-10-CM | POA: Insufficient documentation

## 2019-08-29 DIAGNOSIS — Z803 Family history of malignant neoplasm of breast: Secondary | ICD-10-CM | POA: Diagnosis not present

## 2019-08-29 DIAGNOSIS — Z170 Estrogen receptor positive status [ER+]: Secondary | ICD-10-CM

## 2019-08-29 DIAGNOSIS — R7989 Other specified abnormal findings of blood chemistry: Secondary | ICD-10-CM | POA: Insufficient documentation

## 2019-08-29 DIAGNOSIS — Z79899 Other long term (current) drug therapy: Secondary | ICD-10-CM | POA: Diagnosis not present

## 2019-08-29 DIAGNOSIS — Z823 Family history of stroke: Secondary | ICD-10-CM | POA: Diagnosis not present

## 2019-08-29 DIAGNOSIS — Z833 Family history of diabetes mellitus: Secondary | ICD-10-CM | POA: Diagnosis not present

## 2019-08-29 DIAGNOSIS — C7951 Secondary malignant neoplasm of bone: Secondary | ICD-10-CM | POA: Diagnosis not present

## 2019-08-29 DIAGNOSIS — R748 Abnormal levels of other serum enzymes: Secondary | ICD-10-CM

## 2019-08-29 DIAGNOSIS — I252 Old myocardial infarction: Secondary | ICD-10-CM | POA: Insufficient documentation

## 2019-08-29 DIAGNOSIS — E785 Hyperlipidemia, unspecified: Secondary | ICD-10-CM | POA: Insufficient documentation

## 2019-08-29 DIAGNOSIS — I10 Essential (primary) hypertension: Secondary | ICD-10-CM | POA: Diagnosis not present

## 2019-08-29 DIAGNOSIS — Z923 Personal history of irradiation: Secondary | ICD-10-CM | POA: Diagnosis not present

## 2019-08-29 DIAGNOSIS — C50412 Malignant neoplasm of upper-outer quadrant of left female breast: Secondary | ICD-10-CM

## 2019-08-29 DIAGNOSIS — C7931 Secondary malignant neoplasm of brain: Secondary | ICD-10-CM | POA: Diagnosis not present

## 2019-08-29 DIAGNOSIS — G629 Polyneuropathy, unspecified: Secondary | ICD-10-CM | POA: Diagnosis not present

## 2019-08-29 DIAGNOSIS — Z95828 Presence of other vascular implants and grafts: Secondary | ICD-10-CM

## 2019-08-29 DIAGNOSIS — Z5111 Encounter for antineoplastic chemotherapy: Secondary | ICD-10-CM | POA: Diagnosis not present

## 2019-08-29 DIAGNOSIS — Z7952 Long term (current) use of systemic steroids: Secondary | ICD-10-CM | POA: Insufficient documentation

## 2019-08-29 DIAGNOSIS — Z8249 Family history of ischemic heart disease and other diseases of the circulatory system: Secondary | ICD-10-CM | POA: Insufficient documentation

## 2019-08-29 LAB — CBC WITH DIFFERENTIAL/PLATELET
Abs Immature Granulocytes: 0 10*3/uL (ref 0.00–0.07)
Basophils Absolute: 0 10*3/uL (ref 0.0–0.1)
Basophils Relative: 0 %
Eosinophils Absolute: 0 10*3/uL (ref 0.0–0.5)
Eosinophils Relative: 0 %
HCT: 41 % (ref 36.0–46.0)
Hemoglobin: 13.3 g/dL (ref 12.0–15.0)
Immature Granulocytes: 0 %
Lymphocytes Relative: 19 %
Lymphs Abs: 0.5 10*3/uL — ABNORMAL LOW (ref 0.7–4.0)
MCH: 31 pg (ref 26.0–34.0)
MCHC: 32.4 g/dL (ref 30.0–36.0)
MCV: 95.6 fL (ref 80.0–100.0)
Monocytes Absolute: 0.4 10*3/uL (ref 0.1–1.0)
Monocytes Relative: 13 %
Neutro Abs: 1.9 10*3/uL (ref 1.7–7.7)
Neutrophils Relative %: 68 %
Platelets: 267 10*3/uL (ref 150–400)
RBC: 4.29 MIL/uL (ref 3.87–5.11)
RDW: 14.3 % (ref 11.5–15.5)
WBC: 2.8 10*3/uL — ABNORMAL LOW (ref 4.0–10.5)
nRBC: 0 % (ref 0.0–0.2)

## 2019-08-29 LAB — COMPREHENSIVE METABOLIC PANEL
ALT: 62 U/L — ABNORMAL HIGH (ref 0–44)
AST: 52 U/L — ABNORMAL HIGH (ref 15–41)
Albumin: 3.5 g/dL (ref 3.5–5.0)
Alkaline Phosphatase: 170 U/L — ABNORMAL HIGH (ref 38–126)
Anion gap: 5 (ref 5–15)
BUN: 12 mg/dL (ref 8–23)
CO2: 28 mmol/L (ref 22–32)
Calcium: 8.7 mg/dL — ABNORMAL LOW (ref 8.9–10.3)
Chloride: 104 mmol/L (ref 98–111)
Creatinine, Ser: 0.78 mg/dL (ref 0.44–1.00)
GFR calc Af Amer: >60 mL/min (ref >60)
GFR calc non Af Amer: >60 mL/min (ref >60)
Glucose, Bld: 88 mg/dL (ref 70–99)
Potassium: 3.7 mmol/L (ref 3.5–5.1)
Sodium: 137 mmol/L (ref 135–145)
Total Bilirubin: 0.8 mg/dL (ref 0.3–1.2)
Total Protein: 7 g/dL (ref 6.5–8.1)

## 2019-08-29 MED ORDER — HEPARIN SOD (PORK) LOCK FLUSH 100 UNIT/ML IV SOLN
500.0000 [IU] | Freq: Once | INTRAVENOUS | Status: AC
  Administered 2019-08-29: 500 [IU]

## 2019-08-29 MED ORDER — SODIUM CHLORIDE 0.9% FLUSH
10.0000 mL | Freq: Once | INTRAVENOUS | Status: AC
  Administered 2019-08-29: 10 mL via INTRAVENOUS
  Filled 2019-08-29: qty 10

## 2019-08-29 NOTE — Patient Instructions (Signed)
#   Take Tambocor in AM tomorrow prior to coming for chemo; ALSO bring your Tambocor pill botles with you to you appointments.

## 2019-08-29 NOTE — Assessment & Plan Note (Addendum)
#  Recurrent metastatic breast cancer-ER PR positive HER-2 negative; NOV 2020- PROGRESSION in liver- multiple enhancing lesions noted. Currently on Taxol weekly chemo.   # HOLD # 2 treatment today; see below sec to tachycardia.Otthewise,  Labs today reviewed;  acceptable for treatment.    # tachycardia 120s/ Hx of SVT- recommend taking Tambocor prior to treatment.   # Elevated LFTs- ? Etiology sec to underlying cancer- Improving.   # Brain metastases;  May 06, 2019 BRAIN MRI shows at least 5 new lesions [largest 10 mm]-Recurrent-s/p RT finshed 11/17.  Will repeat MRI in late dec 2020. STABLE.   # Peripheral neuropathy grade 1-2-  on Lyrica; STABLE.   # DISPOSITION:  # hold Taxol today;  # Taxol tomorrow.  # dec 9th labs- cbc/cmp; Taxol- Dr.B

## 2019-08-29 NOTE — Progress Notes (Signed)
[facility] OFFICE PROGRESS NOTE  Patient Care Team: Lada, Satira Anis, MD as PCP - General (Family Medicine) Wellington Hampshire, MD as PCP - Cardiology (Cardiology) Byrnett, Forest Gleason, MD (General Surgery) [name], MD (Inactive) as Attending Physician (Internal Medicine)  Cancer Staging No matching staging information was found for the patient.   Oncology History Overview Note  # LEFT BREAST IDC; STAGE II [cT2N1] ER >90%; PR- 50-90%; her 2 NEU- POS; s/p Neoadj chemo; AUG 2016- TCH+P s/p Lumpec & partial ALND- path CR; s/p RT [finished Nov 2016];  adj Herceptin; HELD for Jan 19th 2017 [in DEC 2016-EF dropped from 63 to 51%; FEB 27th EF-42.9%]; JAN 2016 START Arimidex; May 2017- EF- 60%; May 24th 2017-Re-start Herceptin q 3W; July 28th STOP herceptin [finished 9m/m2; sec to Low EF; however 2017 Oct EF= 67%; improved]  # DEC 4th 2017- Start Neratinib 4 pills; DEC 11th 3 pills; STOPPED.  # FEB 2018- RECURRENCE ER/PR positive; Her 2 NEGATIVE [liver Bx]  # MARCH 1st 2018- Tax-Cytoxan [poor tol]  # FEB 2018- Brain mets [SBRT; Dr.Crystal; finished Feb 28th 2018]  # March 2018- Eribulin- poor tolerance  # June 8th 2018- Started Faslodex + Abema [abema-multiple interruptions sec to neutropenia]  # MRI Brain- March 2019- Leptomeningeal mets [WBRT; No LP s/p WBRT- 01/09/2018]; STOP Abema [545mday-sec to severe cytopenia]+faslodex  # MAY 20tth 019- Start Xeloda; STOPPED in Nov 22nd 2019- sec to Progressive liver lesions on CT scan  # NOV 26th 2019- Faslodex + Piqaray 25071m; Jan 2020- Piqray 300 mg+ faslodex; FEB 17th CT scan- mixed response/ Overall progression; STOP Piqray + faslodex.   # FEB 25th 2020- ERIBULIN; Poor tolerance sec to severe neutropenia.  Stopped May 2020.  CT scan May 2020-new/progressive 2 cm liver lesion;   #July nd 2020-Ixempra x5 cycles-; NOV 2020-MRI liver- worsening liver lesions [rising AST/ALT].   # NOV [id number] weekly;   #May 06, 2019-MRI brain 5 new lesions [3 mm to 10 mm]-asymptomatic; [previous whole brain radiation]- OCT 2020- Re-Irradiation.   # PN- G-2; may 2017- Cymbalta 28m47mMARCH 2018-  acute vascular insuff of BIL LE [? Taxotere vasoconstriction on asprin]; # hemorragic shock LGIB [s/p colo; Dr.Wohl]  #  SVT- on flecainide.   # April 2016- Liver Bx- NEG;   # Drop in EF from Herceptin [recovered OCt 27th- EF 65%]  # BMD- jan 2017- osteopenia  # 2020- SEP-s/p Pallaitive care evaluation  -----------------------------------------------------------     MOLEEl Dara- Sep 4th 2018- PDL-1 0%/NEG for BRCA; MSI-Stable; Positive for PI3K; ccnd-1; FGF** -----------------------------------------------------------  Dx: Metastatic Breast cancer [ER/PR-Pos; her -2 NEG] Stage IV; goals: palliative Current treatment- Taxol   Carcinoma of upper-outer quadrant of left breast in female, estrogen receptor positive (HCC)[place]/25/2020 - 01/17/2019 Chemotherapy   The patient had eriBULin mesylate (HALAVEN) 2.1 mg in sodium chloride 0.9 % 100 mL chemo infusion, 1.2 mg/m2 = 2.1 mg (100 % of original dose 1.2 mg/m2), Intravenous,  Once, 3 of 4 cycles Dose modification: 1.2 mg/m2 (original dose 1.2 mg/m2, Cycle 1, Reason: Provider Judgment), 0.8 mg/m2 (original dose 1.2 mg/m2, Cycle 3, Reason: Provider Judgment) Administration: 2.1 mg (11/22/2018), 2.1 mg (11/29/2018), 2.1 mg (12/20/2018), 1.4 mg (01/17/2019)  for chemotherapy treatment.    03/30/2019 - 07/12/2019 Chemotherapy   The patient had pegfilgrastim (NEULASTA ONPRO KIT) injection 6 mg, 6 mg, Subcutaneous, Once, 5 of 8 cycles Administration: 6 mg (03/30/2019), 6 mg (04/20/2019), 6 mg (05/11/2019), 6 mg (06/22/2019), 6 mg (  06/01/2019) ixabepilone (IXEMPRA) 60 mg in lactated ringers 250 mL chemo infusion, 33.5 mg/m2 = 57 mg (100 % of original dose 32 mg/m2), Intravenous,  Once, 5 of 8 cycles Dose modification: 32 mg/m2 (original dose 32 mg/m2, Cycle 1, Reason:  Provider Judgment) Administration: 72 mg (04/20/2019), 72 mg (05/11/2019), 72 mg (06/22/2019), 72 mg (06/01/2019)  for chemotherapy treatment.    08/18/2019 -  Chemotherapy   The patient had PACLitaxel (TAXOL) 150 mg in sodium chloride 0.9 % 250 mL chemo infusion (</= 50m/m2), 80 mg/m2 = 150 mg, Intravenous,  Once, 1 of 4 cycles Administration: 150 mg (08/18/2019)  for chemotherapy treatment.      INTERVAL HISTORY:  BROME [id number].[id number].  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  Patient denies any worsening tingling and numbness in extremities.  She continues to chronic tingling and numbness.  No falls.  Her appetite is fair.  No nausea or vomiting.  No worsening abdominal pain or constipation.  She denies any chest pain.  Denies any palpitations.  Patient denies taking her morning heart medications  Review of Systems  Constitutional: Positive for malaise/fatigue. Negative for chills, diaphoresis, fever and weight loss.  HENT: Negative for nosebleeds and sore throat.   Eyes: Negative for double vision.  Respiratory: Negative for cough, hemoptysis, sputum production, shortness of breath and wheezing.   Cardiovascular: Negative for chest pain, palpitations, orthopnea and leg swelling.  Gastrointestinal: Negative for abdominal pain, blood in stool, constipation, heartburn, melena, nausea and vomiting.  Genitourinary: Negative for dysuria, frequency and urgency.  Musculoskeletal: Positive for joint pain. Negative for back pain.  Skin: Negative for itching.  Neurological: Positive for tingling. Negative for dizziness, focal weakness, weakness and headaches.  Endo/Heme/Allergies: Does not bruise/bleed easily.  Psychiatric/Behavioral: Negative for depression. The patient is not nervous/anxious and does not have insomnia.     PAST MEDICAL HISTORY :   Past Medical History:  Diagnosis Date  . Chemotherapy-induced peripheral neuropathy ([place]   . Epistaxis    a. 11/2016 in setting of asa/plavix-->silver nitrate cauterization.  . GI bleed    a. 11/2016 Admission w/ GIB and hypovolemic shock req 3u PRBC's;  b. 11/2016 ECG: gastritis & nonbleeding peptic ulcer; c. 11/2016 Conlonoscopy: rectal and sigmoid colonic ulcers.  . Heart attack ([place]    a. 1998 Cath @ [facility]: reportedly no intervention required.  .Marland KitchenHerceptin-induced cardiomyopathy ([place]    a. In the setting of Herceptin Rx for breast cancer (initiated 12/2014); b. 03/2015 MUGA EF 64%; b. 08/2015 MUGA: EF 51%; c. 10/2015 MUGA: EF 44%; d. 11/2015 Echo: EF 45-50%; e. 01/2016 MUGA: EF 60%; f. 06/2016 MUGA EF 65%; g. 10/2016 MUGA: EF 61%;  h. 12/2016 Echo: EF 55-60%, gr1 DD.  .Marland KitchenHyperlipidemia   . Hypertension   . Neuropathy   . Possible PAD (peripheral artery disease) ([place]    a. 11/2016 LE cyanosis and weak pulses-->CTA w/o significant Ao-BiFem dzs. ? distal dzs-->ASA/Plavix initiated by vascular surgery.  .Marland KitchenPSVT (paroxysmal supraventricular tachycardia) ([facility]    a. Dx 11/2016.  . Pulmonary embolism ([facility]    a. 12/2016 CTA Chest: small nonocclusive PE in inferior segment of the Left lingula, somewhat eccentric filling defect suggesting chronic rather than acute embolic event; b. 48/8416LE U/S:  No DVT; c. 12/2016 Echo: Nl RV fxn, nl PASP.  .Marland KitchenRecurrent Metastatic breast cancer ([place]    a. Dx 2016: Stage  II, ER positive, PR positive, HER-2/neu overexpressing of the left breast-->chemo/radiation; b. 10/2016 CT Abd/pelvis: diffuse liver mets, ill defined sclerotic bone lesions-T12;  c. 10/2016 MRI brain: metastatic lesion along L temporal lobe (19x58m) w/ extensive surrounding edema & 574mmidline shift to right.  . Sinus tachycardia     PAST SURGICAL HISTORY :   Past Surgical History:  Procedure Laterality Date  . BREAST BIOPSY Left 2016   Positive  . BREAST LUMPECTOMY WITH SENTINEL LYMPH NODE BIOPSY Left  05/23/2015   Procedure: LEFT BREAST WIDE EXCISION WITH AXILLARY DISSECTION, MASTOPLASTY ;  Surgeon: JeRobert BellowMD;  Location: [facility];  Service: General;  Laterality: Left;  . BREAST SURGERY Left 12/18/14   breast biopsy/INVASIVE DUCTAL CARCINOMA OF BREAST, NOTTINGHAM GRADE 2.  . Marland KitchenREAST SURGERY  05/23/2015.   Wide excision/mastoplasty, axillary dissection. No residual invasive cancer, positive for residual DCIS. 0/2 nodes identified on axillary dissection. (no SLN by technetium or methylene blue)  . CARDIAC CATHETERIZATION    . COLONOSCOPY WITH PROPOFOL N/A 12/10/2016   Procedure: COLONOSCOPY WITH PROPOFOL;  Surgeon: DaLucilla LameMD;  Location: [facility] ENDOSCOPY;  Service: Endoscopy;  Laterality: N/A;  . COLONOSCOPY WITH PROPOFOL N/A 02/13/2017   Procedure: COLONOSCOPY WITH PROPOFOL;  Surgeon: WoLucilla LameMD;  [facility];  Service: Endoscopy;  Laterality: N/A;  . ESOPHAGOGASTRODUODENOSCOPY (EGD) WITH PROPOFOL N/A 12/08/2016   Procedure: ESOPHAGOGASTRODUODENOSCOPY (EGD) WITH PROPOFOL;  Surgeon: DaLucilla LameMD;  Location: [facility];  Service: Endoscopy;  Laterality: N/A;  . IVC FILTER INSERTION N/A 02/15/2017   Procedure: IVC Filter Insertion;  Surgeon: DeAlgernon HuxleyMD;  [facility];  Service: Cardiovascular;  Laterality: N/A;  . PORTACATH PLACEMENT Right 12-31-14   Dr ByBary Castilla  FAMILY HISTORY :   Family History  Problem Relation Age of Onset  . Breast cancer Maternal Aunt   . Breast cancer Cousin   . Brain cancer Maternal Uncle   . Diabetes Mother   . Hypertension Mother   . Stroke Mother     SOCIAL HISTORY:   Social History   Tobacco Use  . Smoking status: Light Tobacco Smoker    Packs/day: 0.50    Years: 18.00    Pack years: 9.00    Types: Cigarettes  . Smokeless tobacco: Never Used  Substance Use Topics  . Alcohol use: No    Alcohol/week: 0.0 standard drinks  . Drug use: No    ALLERGIES:  is allergic to no known  allergies.  MEDICATIONS:  Current Outpatient Medications  Medication Sig Dispense Refill  . flecainide (TAMBOCOR) 50 MG tablet Take 1 tablet (50 mg total) by mouth 2 (two) times daily. 180 tablet 3  . magnesium oxide (MAG-OX) 400 MG tablet Take 1 tablet (400 mg total) by mouth 2 (two) times daily. 60 tablet 5  . pregabalin (LYRICA) 75 MG capsule Take 1 capsule (75 mg total) by mouth 2 (two) times daily. 180 capsule 0  . Vitamin D, Ergocalciferol, (DRISDOL) 1.25 MG (50000 UT) CAPS capsule TAKE 1 CAPSULE (50,000 UNITS TOTAL) ONCE A WEEK BY MOUTH. 12 capsule 1  . dexamethasone (DECADRON) 2 MG tablet Take 1 pill BID [morning and afternoon] (Patient not taking: Reported on 08/28/2019) 45 tablet 0   No current facility-administered medications for this visit.    Facility-Administered Medications Ordered in Other Visits  Medication Dose Route Frequency Provider Last Rate Last Dose  . 0.9 %  sodium chloride infusion   Intravenous Continuous PaLeia AlfMD      .  0.9 %  sodium chloride infusion   Intravenous Continuous Lloyd Huger, MD 999 mL/hr at 04/24/15 1520    . heparin lock flush 100 unit/mL  500 Units Intravenous Once Berenzon, Dmitriy, MD      . sodium chloride 0.9 % injection 10 mL  10 mL Intravenous PRN [name], MD   10 mL at 04/04/15 1440  . sodium chloride flush (NS) 0.9 % injection 10 mL  10 mL Intravenous PRN Berenzon, Dmitriy, MD        PHYSICAL EXAMINATION: ECOG PERFORMANCE STATUS: 1 - Symptomatic but completely ambulatory  BP (!) 142/97   Pulse (!) 120   Temp 98.4 F (36.9 C) (Tympanic)   Resp 18   Ht _0  (1.676 m)   Wt 164 lb (74.4 kg)   SpO2 100%   BMI 26.47 kg/m   Filed Weights   08/29/19 0936  Weight: 164 lb (74.4 kg)    Physical Exam  Constitutional: She is oriented to person, place, and time and well-developed, well-nourished, and in no distress.  Alone.  Walking herself.  HENT:  Head: Normocephalic and atraumatic.  Mouth/Throat:  Oropharynx is clear and moist. No oropharyngeal exudate.  Eyes: Pupils are equal, round, and reactive to light.  Neck: Normal range of motion. Neck supple.  Cardiovascular: Normal rate and regular rhythm.  Pulmonary/Chest: No respiratory distress. She has no wheezes.  Abdominal: Soft. Bowel sounds are normal. She exhibits no distension and no mass. There is no abdominal tenderness. There is no rebound and no guarding.  Musculoskeletal: Normal range of motion.        General: No tenderness or edema.  Neurological: She is alert and oriented to person, place, and time.  Skin: Skin is warm.  Psychiatric: Affect normal.    LABORATORY DATA:  I have reviewed the data as listed    Component Value Date/Time   NA 137 08/29/2019 0908   NA 135 03/04/2017 0918   NA 135 01/08/2015 0850   K 3.7 08/29/2019 0908   K 3.7 01/08/2015 0850   CL 104 08/29/2019 0908   CL 101 01/08/2015 0850   CO2 28 08/29/2019 0908   CO2 26 01/08/2015 0850   GLUCOSE 88 08/29/2019 0908   GLUCOSE 161 (H) 01/08/2015 0850   BUN 12 08/29/2019 0908   BUN 8 03/04/2017 0918   BUN 17 01/08/2015 0850   CREATININE 0.78 08/29/2019 0908   CREATININE 0.82 01/22/2015 1559   CALCIUM 8.7 (L) 08/29/2019 0908   CALCIUM 9.3 01/08/2015 0850   PROT 7.0 08/29/2019 0908   PROT 6.4 03/04/2017 0918   PROT 6.8 01/22/2015 1559   ALBUMIN 3.5 08/29/2019 0908   ALBUMIN 2.5 (L) 03/04/2017 0918   ALBUMIN 3.9 01/22/2015 1559   AST 52 (H) 08/29/2019 0908   AST 34 01/22/2015 1559   ALT 62 (H) 08/29/2019 0908   ALT 42 01/22/2015 1559   ALKPHOS 170 (H) 08/29/2019 0908   ALKPHOS 157 (H) 01/22/2015 1559   BILITOT 0.8 08/29/2019 0908   BILITOT 6.2 (H) 03/04/2017 0918   BILITOT 0.2 (L) 01/22/2015 1559   GFRNONAA >60 08/29/2019 0908   GFRNONAA >60 01/22/2015 1559   GFRAA >60 08/29/2019 0908   GFRAA >60 01/22/2015 1559    No results found for: SPEP, UPEP  Lab Results  Component Value Date   WBC 2.8 (L) 08/29/2019   NEUTROABS 1.9  08/29/2019   HGB 13.3 08/29/2019   HCT 41.0 08/29/2019   MCV 95.6 08/29/2019   PLT 267  08/29/2019      Chemistry      Component Value Date/Time   NA 137 08/29/2019 0908   NA 135 03/04/2017 0918   NA 135 01/08/2015 0850   K 3.7 08/29/2019 0908   K 3.7 01/08/2015 0850   CL 104 08/29/2019 0908   CL 101 01/08/2015 0850   CO2 28 08/29/2019 0908   CO2 26 01/08/2015 0850   BUN 12 08/29/2019 0908   BUN 8 03/04/2017 0918   BUN 17 01/08/2015 0850   CREATININE 0.78 08/29/2019 0908   CREATININE 0.82 01/22/2015 1559      Component Value Date/Time   CALCIUM 8.7 (L) 08/29/2019 0908   CALCIUM 9.3 01/08/2015 0850   ALKPHOS 170 (H) 08/29/2019 0908   ALKPHOS 157 (H) 01/22/2015 1559   AST 52 (H) 08/29/2019 0908   AST 34 01/22/2015 1559   ALT 62 (H) 08/29/2019 0908   ALT 42 01/22/2015 1559   BILITOT 0.8 08/29/2019 0908   BILITOT 6.2 (H) 03/04/2017 0918   BILITOT 0.2 (L) 01/22/2015 1559       RADIOGRAPHIC STUDIES: I have personally reviewed the radiological images as listed and agreed with the findings in the report. No results found.   ASSESSMENT & PLAN:  Carcinoma of upper-outer quadrant of left breast in female, estrogen receptor positive ([place]) # Recurrent metastatic breast cancer-ER PR positive HER-2 negative; NOV 2020- PROGRESSION in liver- multiple enhancing lesions noted. Currently on Taxol weekly chemo.   # HOLD # 2 treatment today; see below sec to tachycardia.Otthewise,  Labs today reviewed;  acceptable for treatment.    # tachycardia 120s/ Hx of SVT- recommend taking Tambocor prior to treatment.   # Elevated LFTs- ? Etiology sec to underlying cancer- Improving.   # Brain metastases;  May 06, 2019 BRAIN MRI shows at least 5 new lesions [largest 10 mm]-Recurrent-s/p RT finshed 11/17.  Will repeat MRI in late dec 2020. STABLE.   # Peripheral neuropathy grade 1-2-  on Lyrica; STABLE.   # DISPOSITION:  # hold Taxol today;  # Taxol tomorrow.  # dec 9th labs- cbc/cmp;  Taxol- Dr.B  Orders Placed This Encounter  Procedures  . CBC with Differential    Standing Status:   Future    Standing Expiration Date:   08/28/2020  . Comprehensive metabolic panel    Standing Status:   Future    Standing Expiration Date:   08/28/2020   All questions were answered. The patient knows to call the clinic with any problems, questions or concerns.     Cammie Sickle, MD 08/29/2019 12:03 PM

## 2019-08-30 ENCOUNTER — Ambulatory Visit: Payer: Self-pay | Admitting: *Deleted

## 2019-08-30 ENCOUNTER — Telehealth: Payer: Self-pay | Admitting: *Deleted

## 2019-08-30 ENCOUNTER — Inpatient Hospital Stay: Payer: Medicare HMO

## 2019-08-30 ENCOUNTER — Ambulatory Visit: Payer: BLUE CROSS/BLUE SHIELD | Admitting: Family Medicine

## 2019-08-30 ENCOUNTER — Other Ambulatory Visit: Payer: Self-pay | Admitting: *Deleted

## 2019-08-30 ENCOUNTER — Other Ambulatory Visit: Payer: Self-pay

## 2019-08-30 ENCOUNTER — Encounter: Payer: Self-pay | Admitting: Oncology

## 2019-08-30 ENCOUNTER — Inpatient Hospital Stay (HOSPITAL_BASED_OUTPATIENT_CLINIC_OR_DEPARTMENT_OTHER): Payer: Medicare HMO | Admitting: Oncology

## 2019-08-30 VITALS — BP 140/98 | HR 114 | Temp 98.2°F | Resp 16 | Wt 166.2 lb

## 2019-08-30 DIAGNOSIS — M8580 Other specified disorders of bone density and structure, unspecified site: Secondary | ICD-10-CM | POA: Diagnosis not present

## 2019-08-30 DIAGNOSIS — C7951 Secondary malignant neoplasm of bone: Secondary | ICD-10-CM | POA: Diagnosis not present

## 2019-08-30 DIAGNOSIS — Z9221 Personal history of antineoplastic chemotherapy: Secondary | ICD-10-CM | POA: Diagnosis not present

## 2019-08-30 DIAGNOSIS — R42 Dizziness and giddiness: Secondary | ICD-10-CM

## 2019-08-30 DIAGNOSIS — C787 Secondary malignant neoplasm of liver and intrahepatic bile duct: Secondary | ICD-10-CM | POA: Diagnosis not present

## 2019-08-30 DIAGNOSIS — C7931 Secondary malignant neoplasm of brain: Secondary | ICD-10-CM

## 2019-08-30 DIAGNOSIS — Z5111 Encounter for antineoplastic chemotherapy: Secondary | ICD-10-CM | POA: Diagnosis not present

## 2019-08-30 DIAGNOSIS — Z923 Personal history of irradiation: Secondary | ICD-10-CM | POA: Diagnosis not present

## 2019-08-30 DIAGNOSIS — C50412 Malignant neoplasm of upper-outer quadrant of left female breast: Secondary | ICD-10-CM | POA: Diagnosis not present

## 2019-08-30 DIAGNOSIS — Z170 Estrogen receptor positive status [ER+]: Secondary | ICD-10-CM | POA: Diagnosis not present

## 2019-08-30 LAB — URINALYSIS, COMPLETE (UACMP) WITH MICROSCOPIC
Bacteria, UA: NONE SEEN
Bilirubin Urine: NEGATIVE
Glucose, UA: NEGATIVE mg/dL
Hgb urine dipstick: NEGATIVE
Ketones, ur: NEGATIVE mg/dL
Leukocytes,Ua: NEGATIVE
Nitrite: NEGATIVE
Protein, ur: NEGATIVE mg/dL
Specific Gravity, Urine: 1.001 — ABNORMAL LOW (ref 1.005–1.030)
Squamous Epithelial / HPF: NONE SEEN (ref 0–5)
WBC, UA: NONE SEEN WBC/hpf (ref 0–5)
pH: 7 (ref 5.0–8.0)

## 2019-08-30 MED ORDER — SODIUM CHLORIDE 0.9 % IV SOLN
Freq: Once | INTRAVENOUS | Status: AC
  Administered 2019-08-30: 12:00:00 via INTRAVENOUS
  Filled 2019-08-30: qty 250

## 2019-08-30 MED ORDER — HEPARIN SOD (PORK) LOCK FLUSH 100 UNIT/ML IV SOLN
500.0000 [IU] | Freq: Once | INTRAVENOUS | Status: AC
  Administered 2019-08-30: 500 [IU] via INTRAVENOUS

## 2019-08-30 MED ORDER — MECLIZINE HCL 25 MG PO TABS: 25.0000 mg | ORAL_TABLET | Freq: Three times a day (TID) | ORAL | 0 refills | Status: AC | PRN

## 2019-08-30 NOTE — Telephone Encounter (Signed)
Radonna Ricker, RN informed me that patient had called pcp this am re: symptoms of dizziness. Pt still has port needle in her chest as she planned to return to clinic today for her chemotherapy. Chemo held yesterday due to tachycardia of 122. Pt told Dr. Rogue Bussing yesterday that she did not take any of her routine heart/BP medications prior to apts yesterday. For this reason, she was r/s for chemotherapy today and pt was instructed to take her medications as directed.  I personally contacted pt to determine if she had obtained an apt with pcp for her concerns today. She has an apt at 10 am. She will come to cancer center after the md apt for port needle d/c.  Will discuss with Dr. B on the timing of r/s her taxol chemotherapy infusion.

## 2019-08-30 NOTE — Telephone Encounter (Signed)
Patient called to cancel her appointment for chemotherapy today stating she was trying to get an appointment with her PCP today. She would not tell me why she was seeing her PCP, just that she knew she could not come to our appointment this morning so appointment cancelled and infusion charge nurse informed that she cancelled.

## 2019-08-30 NOTE — Telephone Encounter (Signed)
Patient was contacted by Dr. B and asked to come into the office today instead of seeing pcp today. Concern for brain mets vs cardiac vs side effects from chemotherapy. Pt gave verbal understanding of plan of care.

## 2019-08-30 NOTE — Progress Notes (Signed)
Symptom Management Consult note [facility]  [phone] [phone]  Patient Care Team: Arnetha Courser, MD as PCP - General (Family Medicine) Wellington Hampshire, MD as PCP - Cardiology (Cardiology) Bary Castilla, Forest Gleason, MD (General Surgery) [name], MD (Inactive) as Attending Physician (Internal Medicine)   Name of the patient: Sarah Horne  [id number]  1955/11/22   Date of visit: 08/30/2019   Diagnosis- Lung cancer with mets to brain  Chief complaint/ Reason for visit- dizziness/fall  Heme/Onc history:  Oncology History Overview Note  # LEFT BREAST IDC; STAGE II [cT2N1] ER >90%; PR- 50-90%; her 2 NEU- POS; s/p Neoadj chemo; AUG 2016- TCH+P s/p Lumpec & partial ALND- path CR; s/p RT [finished Nov 2016];  adj Herceptin; HELD for Jan 19th 2017 [in DEC 2016-EF dropped from 63 to 51%; FEB 27th EF-42.9%]; JAN 2016 START Arimidex; May 2017- EF- 60%; May 24th 2017-Re-start Herceptin q 3W; July 28th STOP herceptin [finished 62m/m2; sec to Low EF; however 2017 Oct EF= 67%; improved]  # DEC 4th 2017- Start Neratinib 4 pills; DEC 11th 3 pills; STOPPED.  # FEB 2018- RECURRENCE ER/PR positive; Her 2 NEGATIVE [liver Bx]  # MARCH 1st 2018- Tax-Cytoxan [poor tol]  # FEB 2018- Brain mets [SBRT; Dr.Crystal; finished Feb 28th 2018]  # March 2018- Eribulin- poor tolerance  # June 8th 2018- Started Faslodex + Abema [abema-multiple interruptions sec to neutropenia]  # MRI Brain- March 2019- Leptomeningeal mets [WBRT; No LP s/p WBRT- 01/09/2018]; STOP Abema [517mday-sec to severe cytopenia]+faslodex  # MAY 20tth 019- Start Xeloda; STOPPED in Nov 22nd 2019- sec to Progressive liver lesions on CT scan  # NOV 26th 2019- Faslodex + Piqaray 25054m; Jan 2020- Piqray 300 mg+ faslodex; FEB 17th CT scan- mixed response/ Overall progression; STOP Piqray + faslodex.   # FEB 25th 2020- ERIBULIN; Poor tolerance sec to severe neutropenia.  Stopped May  2020.  CT scan May 2020-new/progressive 2 cm liver lesion;   #July nd 2020-Ixempra x5 cycles-; NOV 2020-MRI liver- worsening liver lesions [rising AST/ALT].   # NOV [id number] weekly;   #May 06, 2019-MRI brain 5 new lesions [3 mm to 10 mm]-asymptomatic; [previous whole brain radiation]- OCT 2020- Re-Irradiation.   # PN- G-2; may 2017- Cymbalta 61m67mMARCH 2018-  acute vascular insuff of BIL LE [? Taxotere vasoconstriction on asprin]; # hemorragic shock LGIB [s/p colo; Dr.Wohl]  #  SVT- on flecainide.   # April 2016- Liver Bx- NEG;   # Drop in EF from Herceptin [recovered OCt 27th- EF 65%]  # BMD- jan 2017- osteopenia  # 2020- SEP-s/p Pallaitive care evaluation  -----------------------------------------------------------     [place]- Sep 4th 2018- PDL-1 0%/NEG for BRCA; MSI-Stable; Positive for PI3K; ccnd-1; FGF** -----------------------------------------------------------  Dx: Metastatic Breast cancer [ER/PR-Pos; her -2 NEG] Stage IV; goals: palliative Current treatment- Taxol   Carcinoma of upper-outer quadrant of left breast in female, estrogen receptor positive (HCC)[place]/25/2020 - 01/17/2019 Chemotherapy   The patient had eriBULin mesylate (HALAVEN) 2.1 mg in sodium chloride 0.9 % 100 mL chemo infusion, 1.2 mg/m2 = 2.1 mg (100 % of original dose 1.2 mg/m2), Intravenous,  Once, 3 of 4 cycles Dose modification: 1.2 mg/m2 (original dose 1.2 mg/m2, Cycle 1, Reason: Provider Judgment), 0.8 mg/m2 (original dose 1.2 mg/m2, Cycle 3, Reason: Provider Judgment) Administration: 2.1 mg (11/22/2018), 2.1 mg (11/29/2018), 2.1 mg (12/20/2018), 1.4 mg (01/17/2019)  for chemotherapy treatment.    03/30/2019 - 07/12/2019 Chemotherapy  The patient had pegfilgrastim (NEULASTA ONPRO KIT) injection 6 mg, 6 mg, Subcutaneous, Once, 5 of 8 cycles Administration: 6 mg (03/30/2019), 6 mg (04/20/2019), 6 mg (05/11/2019), 6 mg (06/22/2019), 6 mg (06/01/2019) ixabepilone (IXEMPRA) 60  mg in lactated ringers 250 mL chemo infusion, 33.5 mg/m2 = 57 mg (100 % of original dose 32 mg/m2), Intravenous,  Once, 5 of 8 cycles Dose modification: 32 mg/m2 (original dose 32 mg/m2, Cycle 1, Reason: Provider Judgment) Administration: 72 mg (04/20/2019), 72 mg (05/11/2019), 72 mg (06/22/2019), 72 mg (06/01/2019)  for chemotherapy treatment.    08/18/2019 -  Chemotherapy   The patient had PACLitaxel (TAXOL) 150 mg in sodium chloride 0.9 % 250 mL chemo infusion (</= 52m/m2), 80 mg/m2 = 150 mg, Intravenous,  Once, 1 of 4 cycles Administration: 150 mg (08/18/2019)  for chemotherapy treatment.     Interval history-patient was seen and evaluated by Dr. BRogue Bussingyesterday prior her weekly Taxol chemotherapy.  She was noted to have an elevated heart rate-130 bpm.  Patient admitted to not taking her flecainide that morning because she had not had breakfast.  She was instructed to return to clinic today for reevaluation of heart rate and possible Taxol chemothearpy.  She returns to clinic today with complaints of significant dizziness and a fall that she suffered this morning.  She states she was on the way to the bathroom when she became dizzy and fell to the ground without injuring herself.  She denies striking any furniture or object.  She denies any abrasion or pain from the fall.  States dizziness occurs with both sitting and standing and is not triggered by any one movement.  She knows to give her self time prior to changing positions. Often, she has to hold onto furniture or the wall when ambulating to prevent falls.  Dizziness subsides with rest and sitting still.  She has not started or stopped any new medications in the past 2 weeks.  She was previously on dexamethasone 2 mg BID and states she stopped taking the day she completed brain radiation 08/15/2019.  The first episode of dizziness was a week and a half prior to Thanksgiving which is around the same time.  She denies any chest pain or increased  shortness of breath.  She has "coughing spells" but denies any sputum production.  She has regular bowel movements and denies diarrhea or constipation.  ECOG FS:1 - Symptomatic but completely ambulatory  Review of systems- Review of Systems  Constitutional: Negative.  Negative for chills, fever, malaise/fatigue and weight loss.  HENT: Negative for congestion, ear pain and tinnitus.   Eyes: Negative.  Negative for blurred vision and double vision.  Respiratory: Negative.  Negative for cough, sputum production and shortness of breath.   Cardiovascular: Negative.  Negative for chest pain, palpitations and leg swelling.  Gastrointestinal: Negative.  Negative for abdominal pain, constipation, diarrhea, nausea and vomiting.  Genitourinary: Negative for dysuria, frequency and urgency.  Musculoskeletal: Positive for falls. Negative for back pain.  Skin: Negative.  Negative for rash.  Neurological: Positive for dizziness. Negative for weakness and headaches.  Endo/Heme/Allergies: Negative.  Does not bruise/bleed easily.  Psychiatric/Behavioral: Negative.  Negative for depression. The patient is not nervous/anxious and does not have insomnia.      Current treatment- Weekly taxol; sp whole brain radiation  Allergies  Allergen Reactions   No Known Allergies      Past Medical History:  Diagnosis Date   Chemotherapy-induced peripheral neuropathy (HCC)    Epistaxis  a. 11/2016 in setting of asa/plavix-->silver nitrate cauterization.   GI bleed    a. 11/2016 Admission w/ GIB and hypovolemic shock req 3u PRBC's;  b. 11/2016 ECG: gastritis & nonbleeding peptic ulcer; c. 11/2016 Conlonoscopy: rectal and sigmoid colonic ulcers.   Heart attack ([place])    a. 1998 Cath @ [facility]: reportedly no intervention required.   Herceptin-induced cardiomyopathy ([place])    a. In the setting of Herceptin Rx for breast cancer (initiated 12/2014); b. 03/2015 MUGA EF 64%; b. 08/2015 MUGA: EF 51%; c. 10/2015 MUGA: EF 44%; d.  11/2015 Echo: EF 45-50%; e. 01/2016 MUGA: EF 60%; f. 06/2016 MUGA EF 65%; g. 10/2016 MUGA: EF 61%;  h. 12/2016 Echo: EF 55-60%, gr1 DD.   Hyperlipidemia    Hypertension    Neuropathy    Possible PAD (peripheral artery disease) ([place])    a. 11/2016 LE cyanosis and weak pulses-->CTA w/o significant Ao-BiFem dzs. ? distal dzs-->ASA/Plavix initiated by vascular surgery.   PSVT (paroxysmal supraventricular tachycardia) ([place])    a. Dx 11/2016.   Pulmonary embolism ([place])    a. 12/2016 CTA Chest: small nonocclusive PE in inferior segment of the Left lingula, somewhat eccentric filling defect suggesting chronic rather than acute embolic event; b. 0/0370 LE U/S:  No DVT; c. 12/2016 Echo: Nl RV fxn, nl PASP.   Recurrent Metastatic breast cancer ([place])    a. Dx 2016: Stage II, ER positive, PR positive, HER-2/neu overexpressing of the left breast-->chemo/radiation; b. 10/2016 CT Abd/pelvis: diffuse liver mets, ill defined sclerotic bone lesions-T12;  c. 10/2016 MRI brain: metastatic lesion along L temporal lobe (19x65m) w/ extensive surrounding edema & 529mmidline shift to right.   Sinus tachycardia      Past Surgical History:  Procedure Laterality Date   BREAST BIOPSY Left 2016   Positive   BREAST LUMPECTOMY WITH SENTINEL LYMPH NODE BIOPSY Left 05/23/2015   Procedure: LEFT BREAST WIDE EXCISION WITH AXILLARY DISSECTION, MASTOPLASTY ;  Surgeon: JeRobert BellowMD;  Location: [facility] ORS;  Service: General;  Laterality: Left;   BREAST SURGERY Left 12/18/14   breast biopsy/INVASIVE DUCTAL CARCINOMA OF BREAST, NOTTINGHAM GRADE 2.   BREAST SURGERY  05/23/2015.   Wide excision/mastoplasty, axillary dissection. No residual invasive cancer, positive for residual DCIS. 0/2 nodes identified on axillary dissection. (no SLN by technetium or methylene blue)   CARDIAC CATHETERIZATION     COLONOSCOPY WITH PROPOFOL N/A 12/10/2016   Procedure: COLONOSCOPY WITH PROPOFOL;  Surgeon: DaLucilla LameMD;  Location: [facility]  ENDOSCOPY;  Service: Endoscopy;  Laterality: N/A;   COLONOSCOPY WITH PROPOFOL N/A 02/13/2017   Procedure: COLONOSCOPY WITH PROPOFOL;  Surgeon: WoLucilla LameMD;  [facility];  Service: Endoscopy;  Laterality: N/A;   ESOPHAGOGASTRODUODENOSCOPY (EGD) WITH PROPOFOL N/A 12/08/2016   Procedure: ESOPHAGOGASTRODUODENOSCOPY (EGD) WITH PROPOFOL;  Surgeon: DaLucilla LameMD;  Location: [facility] ENDOSCOPY;  Service: Endoscopy;  Laterality: N/A;   IVC FILTER INSERTION N/A 02/15/2017   Procedure: IVC Filter Insertion;  Surgeon: DeAlgernon HuxleyMD;  [facility];  Service: Cardiovascular;  Laterality: N/A;   PORTACATH PLACEMENT Right 12-31-14   Dr ByBary Castilla  Social History   Socioeconomic History   Marital status: Divorced    Spouse name: Not on file   Number of children: Not on file   Years of education: Not on file   Highest education level: Not on file  Occupational History   Not on file  Social Needs   Financial resource strain: Not on file  Food insecurity    Worry: Not on file    Inability: Not on file   Transportation needs    Medical: Not on file    Non-medical: Not on file  Tobacco Use   Smoking status: Light Tobacco Smoker    Packs/day: 0.50    Years: 18.00    Pack years: 9.00    Types: Cigarettes   Smokeless tobacco: Never Used  Substance and Sexual Activity   Alcohol use: No    Alcohol/week: 0.0 standard drinks   Drug use: No   Sexual activity: Not on file  Lifestyle   Physical activity    Days per week: Not on file    Minutes per session: Not on file   Stress: Not on file  Relationships   Social connections    Talks on phone: Not on file    Gets together: Not on file    Attends religious service: Not on file    Active member of club or organization: Not on file    Attends meetings of clubs or organizations: Not on file    Relationship status: Not on file   Intimate partner violence    Fear of current or ex partner: Not on  file    Emotionally abused: Not on file    Physically abused: Not on file    Forced sexual activity: Not on file  Other Topics Concern   Not on file  Social History Narrative   Lives in [place] by herself.    Family History  Problem Relation Age of Onset   Breast cancer Maternal Aunt    Breast cancer Cousin    Brain cancer Maternal Uncle    Diabetes Mother    Hypertension Mother    Stroke Mother      Current Outpatient Medications:    dexamethasone (DECADRON) 2 MG tablet, Take 1 pill BID [morning and afternoon] (Patient not taking: Reported on 08/28/2019), Disp: 45 tablet, Rfl: 0   dexamethasone (DECADRON) 4 MG tablet, Take 1 tablet (4 mg total) by mouth 2 (two) times daily., Disp: 20 tablet, Rfl: 0   flecainide (TAMBOCOR) 50 MG tablet, Take 1 tablet (50 mg total) by mouth 2 (two) times daily., Disp: 180 tablet, Rfl: 3   magnesium oxide (MAG-OX) 400 MG tablet, Take 1 tablet (400 mg total) by mouth 2 (two) times daily., Disp: 60 tablet, Rfl: 5   meclizine (ANTIVERT) 25 MG tablet, Take 1 tablet (25 mg total) by mouth 3 (three) times daily as needed for dizziness., Disp: 30 tablet, Rfl: 0   pregabalin (LYRICA) 75 MG capsule, Take 1 capsule (75 mg total) by mouth 2 (two) times daily., Disp: 180 capsule, Rfl: 0   Vitamin D, Ergocalciferol, (DRISDOL) 1.25 MG (50000 UT) CAPS capsule, TAKE 1 CAPSULE (50,000 UNITS TOTAL) ONCE A WEEK BY MOUTH., Disp: 12 capsule, Rfl: 1 No current facility-administered medications for this visit.   Facility-Administered Medications Ordered in Other Visits:    0.9 %  sodium chloride infusion, , Intravenous, Continuous, Pandit, Sandeep, MD   0.9 %  sodium chloride infusion, , Intravenous, Continuous, Finnegan, Kathlene November, MD, Last Rate: 999 mL/hr at 04/24/15 1520   heparin lock flush 100 unit/mL, 500 Units, Intravenous, Once, Berenzon, Dmitriy, MD   sodium chloride 0.9 % injection 10 mL, 10 mL, Intravenous, PRN, Ma Hillock, Sandeep, MD, 10 mL at  04/04/15 1440   sodium chloride flush (NS) 0.9 % injection 10 mL, 10 mL, Intravenous, PRN, Roxana Hires, MD  Physical exam:  Vitals:  08/30/19 1051  BP: (!) 140/98  Pulse: (!) 114  Resp: 16  Temp: 98.2 F (36.8 C)  TempSrc: Tympanic  Weight: 166 lb 3.2 oz (75.4 kg)   Physical Exam Constitutional:      Appearance: Normal appearance.     Comments: Alone; wheelchair   HENT:     Head: Normocephalic and atraumatic.  Eyes:     Extraocular Movements:     Right eye: Nystagmus present.     Left eye: Nystagmus present.     Pupils: Pupils are equal, round, and reactive to light.  Neck:     Musculoskeletal: Normal range of motion.  Cardiovascular:     Rate and Rhythm: Normal rate and regular rhythm.     Heart sounds: Normal heart sounds. No murmur.  Pulmonary:     Effort: Pulmonary effort is normal.     Breath sounds: Normal breath sounds. No wheezing.  Abdominal:     General: Bowel sounds are normal. There is no distension.     Palpations: Abdomen is soft.     Tenderness: There is no abdominal tenderness.  Musculoskeletal: Normal range of motion.  Skin:    General: Skin is warm and dry.     Findings: No rash.  Neurological:     General: No focal deficit present.     Mental Status: She is alert and oriented to person, place, and time.     Motor: Weakness present.  Psychiatric:        Judgment: Judgment normal.      CMP Latest Ref Rng & Units 08/31/2019  Glucose 70 - 99 mg/dL 76  BUN 8 - 23 mg/dL 7(L)  Creatinine 0.44 - 1.00 mg/dL 0.73  Sodium 135 - 145 mmol/L 138  Potassium 3.5 - 5.1 mmol/L 3.9  Chloride 98 - 111 mmol/L 103  CO2 22 - 32 mmol/L 24  Calcium 8.9 - 10.3 mg/dL 8.7(L)  Total Protein 6.5 - 8.1 g/dL 7.3  Total Bilirubin 0.3 - 1.2 mg/dL 0.8  Alkaline Phos 38 - 126 U/L 197(H)  AST 15 - 41 U/L 71(H)  ALT 0 - 44 U/L 70(H)   CBC Latest Ref Rng & Units 08/31/2019  WBC 4.0 - 10.5 K/uL 3.8(L)  Hemoglobin 12.0 - 15.0 g/dL 13.6  Hematocrit 36.0 - 46.0 %  41.8  Platelets 150 - 400 K/uL 286    No images are attached to the encounter.  Mr Liver W Wo Contrast  Result Date: 08/16/2019 CLINICAL DATA:  Breast cancer with known liver metastases. EXAM: MRI ABDOMEN WITHOUT AND WITH CONTRAST TECHNIQUE: Multiplanar multisequence MR imaging of the abdomen was performed both before and after the administration of intravenous contrast. CONTRAST:  39m GADAVIST GADOBUTROL 1 MMOL/ML IV SOLN COMPARISON:  Ultrasound exam 08/04/2019.  PET-CT 06/20/2019. FINDINGS: Lower chest: Unremarkable. Hepatobiliary: Innumerable liver metastases noted in both hepatic lobes. Lesions are well seen on T2 imaging. Index lesion in the lateral segment left liver measures 1.9 cm on image 15 of series 6. 2.6 cm lesion identified posterior right liver on 6/6. Index 1.7 cm subcapsular lesion medial segment left liver noted on 15/6. Innumerable other metastases range in size from about 6 mm up to 2.2 cm. There is no evidence for gallstones, gallbladder wall thickening, or pericholecystic fluid. No intrahepatic or extrahepatic biliary dilation. Pancreas: No focal mass lesion. No dilatation of the main duct. No intraparenchymal cyst. No peripancreatic edema. Spleen:  No splenomegaly. No focal mass lesion. Adrenals/Urinary Tract: No adrenal nodule or mass. Kidneys unremarkable. Stomach/Bowel:  Stomach is unremarkable. No gastric wall thickening. No evidence of outlet obstruction. Duodenum is normally positioned as is the ligament of Treitz. No small bowel or colonic dilatation within the visualized abdomen. Vascular/Lymphatic: No abdominal aortic aneurysm. No abdominal lymphadenopathy Other:  No intraperitoneal free fluid. Musculoskeletal: Tiny enhancing lesion identified in the right aspect of the T12 vertebral body raising concern for bony metastatic involvement. IMPRESSION: 1. Innumerable hepatic metastases ranging in size from 6 mm up to about 2.6 cm. 2. Tiny enhancing lesion in the right T12  vertebral body, concerning for bony metastatic involvement. Electronically Signed   By: Misty Stanley M.D.   On: 08/16/2019 12:55   US Abdomen Limited Ruq  Result Date: 08/04/2019 CLINICAL DATA:  Elevated LFTs, history metastatic breast cancer EXAM: ULTRASOUND ABDOMEN LIMITED RIGHT UPPER QUADRANT COMPARISON:  MR abdomen 02/09/2019 FINDINGS: Gallbladder: Normally distended without stones or wall thickening. No pericholecystic fluid or sonographic Murphy sign. Common bile duct: Diameter: Normal caliber 3 mm diameter Liver: Heterogeneous hepatic echogenicity. Nodular margins. Appearance suggest presence of multiple hepatic nodules although these are not well delineated sonographically. Patient has a history of a patent metastatic disease by prior MR and PET-CT. Portal vein is patent on color Doppler imaging with normal direction of blood flow towards the liver. Other: No RIGHT upper quadrant free fluid. IMPRESSION: Nodular hepatic margins which may be related to cirrhosis and or metastatic disease. Heterogeneous hepatic parenchyma, patient with known metastatic disease though discrete measurable metastatic lesions are not delineated. Unremarkable gallbladder without biliary dilatation. Electronically Signed   By: Lavonia Dana M.D.   On: 08/04/2019 08:44   Assessment and plan- Patient is a 63 y.o. female who presents to symptom management for complaints of dizziness resulting in a fall.  Stage IV breast cancer with leptomeningeal and labs for metastasis: Status post multiple lines of treatment.  MRI of brain on 05/06/2019 shows several new metastatic lesions.  MRI on 07/06/2019 also shows evidence of disease progression with enlarging existing lesions and noted interval new metastatic lesions.  Recently completed whole brain radiation.  Currently receiving weekly Taxol.  First cycle given on 08/18/2019.  Second cycle of weekly Taxol was delayed secondary to tachycardia.  Rescheduled for today.  Hold weekly Taxol  today.  Return to clinic as scheduled next week for reconsideration of cycle 2.  Dizziness resulting in a fall: Unclear etiology.  Will check orthostatic vital signs and perform neuro assessment.  Differentials diagnosis include inhancing lesions in brain, vertigo, adrenal insufficiency, heart arrhythmia, UTI or dehydration.  See plan below.   Enhancing brain lesions: Less likely given recent whole brain radiation. MRI of brain to confirm.   Vertigo: Positive for mild nystagmus.  Could try meclizine in the future if needed.   Adrenal insufficiency: Likely given patient abruptly stopped her dexamethasone after completing radiation on 08/18/2019.  Will restart and see if symptoms resolve.  We will also check cortisol and ACT H levels first thing tomorrow morning.   Heart arrhythmia: History of SVT.  Patient forgot to take Flecainide yesterday.  Heart rate improved today. Urged compliance.   UTI: We will check urinalysis.   Dehydration: We will give IV fluids today.  Blood pressure stable.  Heart rate improved post fluids.   Plan: Lab work.  (Cortisol and ACTH, CBC. CMP).  Unable to collect cortisol and HCT H levels due to time of day.  Will check first thing tomorrow morning. Orthostatic vital signs.  Negative. Neuro assessment.  Positive for nystagmus mild. RX meclizine  if needed.  Urinalysis.  Negative. Restart 4 mg dexamethasone twice daily for 10 days.  She will see Dr. Rogue Bussing to taper.  Consulted with Dr. Per Monday and he recommends MRI of brain.  Although enhancing lesions of the brain is least likely since she just completed brain radiation, will rule out given recent fall. Stat MRI Brain: 08/31/19 at noon.  Disposition: Return to clinic on 08/31/2019 for MRI of brain. Return to clinic on 08/31/19 AM for labs. Return to clinic as scheduled on 09/06/2019 for results of MRI, lab work and weekly Taxol.  Visit Diagnosis 1. Brain metastasis ([place])   2. Dizziness and giddiness      Patient expressed understanding and was in agreement with this plan. She also understands that She can call clinic at any time with any questions, concerns, or complaints.   Greater than 50% was spent in counseling and coordination of care with this patient including but not limited to discussion of the relevant topics above (See A&P) including, but not limited to diagnosis and management of acute and chronic medical conditions.   Thank you for allowing me to participate in the care of this very pleasant patient.    Jacquelin Hawking, NP [place] at [facility] Cell - [phone] Pager- [phone] 08/31/2019 10:10 AM

## 2019-08-30 NOTE — Telephone Encounter (Signed)
Per initial encounter, pt called and stated that she woke up with dizziness but did not feel like she would pass out. Pt wanted an appointment today but nothing is available. Pt would like to know what she should do.; contacted pt, and she says she" got up this moriing and everything was spinning around"; the pt says that it has happened twice since 08/14/2019; she says her BP /R is ok; her cbg this morning was 112; recommendations made per nurse triage protocol; she verbalized understanding; the pt was seen by Dr Sanda Klein, [facility]; pt transferred to [facility] for scheduling. Reason for Disposition . [1] MODERATE dizziness (e.g., interferes with normal activities) AND [2] has NOT been evaluated by physician for this  (Exception: dizziness caused by heat exposure, sudden standing, or poor fluid intake)  Answer Assessment - Initial Assessment Questions 1. DESCRIPTION: "Describe your dizziness."     lightheaded 2. LIGHTHEADED: "Do you feel lightheaded?" (e.g., somewhat faint, woozy, weak upon standing)     Woozy, staggering 3. VERTIGO: "Do you feel like either you or the room is spinning or tilting?" (i.e. vertigo)     *yes 4. SEVERITY: "How bad is it?"  "Do you feel like you are going to faint?" "Can you stand and walk?"   - MILD - walking normally   - MODERATE - interferes with normal activities (e.g., work, school)    - SEVERE - unable to stand, requires support to walk, feels like passing out now.     moderate 5. ONSET:  "When did the dizziness begin?"    08/14/2019 6. AGGRAVATING FACTORS: "Does anything make it worse?" (e.g., standing, change in head position)    no 7. HEART RATE: "Can you tell me your heart rate?" "How many beats in 15 seconds?"  (Note: not all patients can do this)        8. CAUSE: "What do you think is causing the dizziness?"    Maybe inner ear infection 9. RECURRENT SYMPTOM: "Have you had dizziness before?" If so, ask: "When was the last time?" "What happened  that time?"     no 10. OTHER SYMPTOMS: "Do you have any other symptoms?" (e.g., fever, chest pain, vomiting, diarrhea, bleeding)      Vomited x 1 last week but not related to dizziness 11. PREGNANCY: "Is there any chance you are pregnant?" "When was your last menstrual period?"     no  Protocols used: DIZZINESS [facility]

## 2019-08-30 NOTE — Telephone Encounter (Signed)
Appointment was scheduled, but pt canceled states she will talk with her cancer dr. Today since already has an appt

## 2019-08-30 NOTE — Progress Notes (Signed)
Pt states that she does not feel weak she feels unsteady on her feet. She states that she feels like things are spinning. She checked her b/p yest. -103/75, 11/75, 116/85. This am b/p was 130/92 at home. She fell this am after a step or two she fell and landed on right side and no injuries

## 2019-08-31 ENCOUNTER — Other Ambulatory Visit: Payer: Self-pay

## 2019-08-31 ENCOUNTER — Inpatient Hospital Stay: Payer: Medicare HMO

## 2019-08-31 ENCOUNTER — Ambulatory Visit
Admission: RE | Admit: 2019-08-31 | Discharge: 2019-08-31 | Disposition: A | Discharge status: Home / Self Care | Payer: Medicare HMO | Source: Ambulatory Visit | Attending: Oncology | Admitting: Oncology

## 2019-08-31 DIAGNOSIS — C7951 Secondary malignant neoplasm of bone: Secondary | ICD-10-CM | POA: Diagnosis not present

## 2019-08-31 DIAGNOSIS — C7931 Secondary malignant neoplasm of brain: Secondary | ICD-10-CM | POA: Diagnosis not present

## 2019-08-31 DIAGNOSIS — R42 Dizziness and giddiness: Secondary | ICD-10-CM

## 2019-08-31 DIAGNOSIS — C787 Secondary malignant neoplasm of liver and intrahepatic bile duct: Secondary | ICD-10-CM | POA: Diagnosis not present

## 2019-08-31 DIAGNOSIS — C50412 Malignant neoplasm of upper-outer quadrant of left female breast: Secondary | ICD-10-CM

## 2019-08-31 DIAGNOSIS — M8580 Other specified disorders of bone density and structure, unspecified site: Secondary | ICD-10-CM | POA: Diagnosis not present

## 2019-08-31 DIAGNOSIS — Z923 Personal history of irradiation: Secondary | ICD-10-CM | POA: Diagnosis not present

## 2019-08-31 DIAGNOSIS — Z5111 Encounter for antineoplastic chemotherapy: Secondary | ICD-10-CM | POA: Diagnosis not present

## 2019-08-31 DIAGNOSIS — C50919 Malignant neoplasm of unspecified site of unspecified female breast: Secondary | ICD-10-CM | POA: Diagnosis not present

## 2019-08-31 DIAGNOSIS — Z170 Estrogen receptor positive status [ER+]: Secondary | ICD-10-CM | POA: Diagnosis not present

## 2019-08-31 DIAGNOSIS — Z9221 Personal history of antineoplastic chemotherapy: Secondary | ICD-10-CM | POA: Diagnosis not present

## 2019-08-31 LAB — CBC WITH DIFFERENTIAL/PLATELET
Abs Immature Granulocytes: 0.01 10*3/uL (ref 0.00–0.07)
Basophils Absolute: 0 10*3/uL (ref 0.0–0.1)
Basophils Relative: 0 %
Eosinophils Absolute: 0 10*3/uL (ref 0.0–0.5)
Eosinophils Relative: 0 %
HCT: 41.8 % (ref 36.0–46.0)
Hemoglobin: 13.6 g/dL (ref 12.0–15.0)
Immature Granulocytes: 0 %
Lymphocytes Relative: 14 %
Lymphs Abs: 0.5 10*3/uL — ABNORMAL LOW (ref 0.7–4.0)
MCH: 30.8 pg (ref 26.0–34.0)
MCHC: 32.5 g/dL (ref 30.0–36.0)
MCV: 94.8 fL (ref 80.0–100.0)
Monocytes Absolute: 0.6 10*3/uL (ref 0.1–1.0)
Monocytes Relative: 15 %
Neutro Abs: 2.7 10*3/uL (ref 1.7–7.7)
Neutrophils Relative %: 71 %
Platelets: 286 10*3/uL (ref 150–400)
RBC: 4.41 MIL/uL (ref 3.87–5.11)
RDW: 13.9 % (ref 11.5–15.5)
WBC: 3.8 10*3/uL — ABNORMAL LOW (ref 4.0–10.5)
nRBC: 0 % (ref 0.0–0.2)

## 2019-08-31 LAB — COMPREHENSIVE METABOLIC PANEL
ALT: 70 U/L — ABNORMAL HIGH (ref 0–44)
AST: 71 U/L — ABNORMAL HIGH (ref 15–41)
Albumin: 3.5 g/dL (ref 3.5–5.0)
Alkaline Phosphatase: 197 U/L — ABNORMAL HIGH (ref 38–126)
Anion gap: 11 (ref 5–15)
BUN: 7 mg/dL — ABNORMAL LOW (ref 8–23)
CO2: 24 mmol/L (ref 22–32)
Calcium: 8.7 mg/dL — ABNORMAL LOW (ref 8.9–10.3)
Chloride: 103 mmol/L (ref 98–111)
Creatinine, Ser: 0.73 mg/dL (ref 0.44–1.00)
GFR calc Af Amer: >60 mL/min (ref >60)
GFR calc non Af Amer: >60 mL/min (ref >60)
Glucose, Bld: 76 mg/dL (ref 70–99)
Potassium: 3.9 mmol/L (ref 3.5–5.1)
Sodium: 138 mmol/L (ref 135–145)
Total Bilirubin: 0.8 mg/dL (ref 0.3–1.2)
Total Protein: 7.3 g/dL (ref 6.5–8.1)

## 2019-08-31 LAB — CORTISOL: Cortisol, Plasma: 14.8 ug/dL

## 2019-08-31 MED ORDER — GADOBUTROL 1 MMOL/ML IV SOLN
7.0000 mL | Freq: Once | INTRAVENOUS | Status: AC | PRN
  Administered 2019-08-31: 7 mL via INTRAVENOUS

## 2019-08-31 MED ORDER — DEXAMETHASONE 4 MG PO TABS: 4.0000 mg | ORAL_TABLET | Freq: Two times a day (BID) | ORAL | 0 refills | Status: DC

## 2019-08-31 NOTE — Progress Notes (Signed)
Called and spoke to patient to follow-up from our visit yesterday.  She is feeling much better since starting back her steroids.  She denies any further falls or dizzy spells.  I also gave her results from her MRI of her brain which is reassuring.  She has follow-up with you next week.  I told her to continue her steroids twice daily until she sees you.  Her cortisol and ACTH levels are still pending.  Thanks.  Faythe Casa, NP 08/31/2019 1:14 PM

## 2019-09-01 LAB — ACTH: C206 ACTH: 15.3 pg/mL (ref 7.2–63.3)

## 2019-09-01 LAB — URINE CULTURE

## 2019-09-04 ENCOUNTER — Other Ambulatory Visit: Payer: Self-pay | Admitting: *Deleted

## 2019-09-04 ENCOUNTER — Ambulatory Visit: Payer: Self-pay | Admitting: *Deleted

## 2019-09-04 MED ORDER — FLECAINIDE ACETATE 50 MG PO TABS: 50.0000 mg | ORAL_TABLET | Freq: Two times a day (BID) | ORAL | 4 refills | Status: DC

## 2019-09-04 NOTE — Telephone Encounter (Signed)
Patient calls with continuous clear runny nose. Denies all other symptoms. Care Advice including OTC antihistamine, non drowsy claritin and drink plenty of water everyday. Call back with any questions/concerns. Reason for Disposition . Care advice for mild cough, questions about  Answer Assessment - Initial Assessment Questions 1. ONSET: "When did the nasal discharge start?"      About one week 2. AMOUNT: "How much discharge is there?"     Just keeps running all the time 3. COUGH: "Do you have a cough?" If yes, ask: "Describe the color of your sputum" (clear, white, yellow, green)     no 4. RESPIRATORY DISTRESS: "Describe your breathing."      normal 5. FEVER: "Do you have a fever?" If so, ask: "What is your temperature, how was it measured, and when did it start?"     none 6. SEVERITY: "Overall, how bad are you feeling right now?" (e.g., doesn't interfere with normal activities, staying home from school/work, staying in bed)      Fine, just a runny nose 7. OTHER SYMPTOMS: "Do you have any other symptoms?" (e.g., sore throat, earache, wheezing, vomiting)     none 8. PREGNANCY: "Is there any chance you are pregnant?" "When was your last menstrual period?"     na  Protocols used: COMMON COLD-A-AH

## 2019-09-05 ENCOUNTER — Ambulatory Visit: Payer: BLUE CROSS/BLUE SHIELD | Admitting: Family Medicine

## 2019-09-06 ENCOUNTER — Inpatient Hospital Stay: Payer: Medicare HMO

## 2019-09-06 ENCOUNTER — Inpatient Hospital Stay: Payer: Medicare HMO | Admitting: Internal Medicine

## 2019-09-06 NOTE — Assessment & Plan Note (Deleted)
#  Recurrent metastatic breast cancer-ER PR positive HER-2 negative; NOV 2020- PROGRESSION in liver- multiple enhancing lesions noted. Currently on Taxol weekly chemo.   # HOLD # 2 treatment today; see below sec to tachycardia.Otthewise,  Labs today reviewed;  acceptable for treatment.    # tachycardia 120s/ Hx of SVT- recommend taking Tambocor prior to treatment.   # Elevated LFTs- ? Etiology sec to underlying cancer- Improving.   # Brain metastases;  May 06, 2019 BRAIN MRI shows at least 5 new lesions [largest 10 mm]-Recurrent-s/p RT finshed 11/17.  Will repeat MRI in late dec 2020. STABLE.   # Peripheral neuropathy grade 1-2-  on Lyrica; STABLE.   # DISPOSITION:  # hold Taxol today;  # Taxol tomorrow.  # dec 9th labs- cbc/cmp; Taxol- Dr.B 

## 2019-09-06 NOTE — Progress Notes (Deleted)
[facility] OFFICE PROGRESS NOTE  Patient Care Team: Lada, Satira Anis, MD as PCP - General (Family Medicine) Wellington Hampshire, MD as PCP - Cardiology (Cardiology) Byrnett, Forest Gleason, MD (General Surgery) [name], MD (Inactive) as Attending Physician (Internal Medicine)  Cancer Staging No matching staging information was found for the patient.   Oncology History Overview Note  # LEFT BREAST IDC; STAGE II [cT2N1] ER >90%; PR- 50-90%; her 2 NEU- POS; s/p Neoadj chemo; AUG 2016- TCH+P s/p Lumpec & partial ALND- path CR; s/p RT [finished Nov 2016];  adj Herceptin; HELD for Jan 19th 2017 [in DEC 2016-EF dropped from 63 to 51%; FEB 27th EF-42.9%]; JAN 2016 START Arimidex; May 2017- EF- 60%; May 24th 2017-Re-start Herceptin q 3W; July 28th STOP herceptin [finished 9m/m2; sec to Low EF; however 2017 Oct EF= 67%; improved]  # DEC 4th 2017- Start Neratinib 4 pills; DEC 11th 3 pills; STOPPED.  # FEB 2018- RECURRENCE ER/PR positive; Her 2 NEGATIVE [liver Bx]  # MARCH 1st 2018- Tax-Cytoxan [poor tol]  # FEB 2018- Brain mets [SBRT; Dr.Crystal; finished Feb 28th 2018]  # March 2018- Eribulin- poor tolerance  # June 8th 2018- Started Faslodex + Abema [abema-multiple interruptions sec to neutropenia]  # MRI Brain- March 2019- Leptomeningeal mets [WBRT; No LP s/p WBRT- 01/09/2018]; STOP Abema [545mday-sec to severe cytopenia]+faslodex  # MAY 20tth 019- Start Xeloda; STOPPED in Nov 22nd 2019- sec to Progressive liver lesions on CT scan  # NOV 26th 2019- Faslodex + Piqaray 25071m; Jan 2020- Piqray 300 mg+ faslodex; FEB 17th CT scan- mixed response/ Overall progression; STOP Piqray + faslodex.   # FEB 25th 2020- ERIBULIN; Poor tolerance sec to severe neutropenia.  Stopped May 2020.  CT scan May 2020-new/progressive 2 cm liver lesion;   #July nd 2020-Ixempra x5 cycles-; NOV 2020-MRI liver- worsening liver lesions [rising AST/ALT].   # NOV [id number] weekly;   #May 06, 2019-MRI brain 5 new lesions [3 mm to 10 mm]-asymptomatic; [previous whole brain radiation]- OCT 2020- Re-Irradiation.   # PN- G-2; may 2017- Cymbalta 28m47mMARCH 2018-  acute vascular insuff of BIL LE [? Taxotere vasoconstriction on asprin]; # hemorragic shock LGIB [s/p colo; Dr.Wohl]  #  SVT- on flecainide.   # April 2016- Liver Bx- NEG;   # Drop in EF from Herceptin [recovered OCt 27th- EF 65%]  # BMD- jan 2017- osteopenia  # 2020- SEP-s/p Pallaitive care evaluation  -----------------------------------------------------------     MOLEEl Dara- Sep 4th 2018- PDL-1 0%/NEG for BRCA; MSI-Stable; Positive for PI3K; ccnd-1; FGF** -----------------------------------------------------------  Dx: Metastatic Breast cancer [ER/PR-Pos; her -2 NEG] Stage IV; goals: palliative Current treatment- Taxol   Carcinoma of upper-outer quadrant of left breast in female, estrogen receptor positive (HCC)[place]/25/2020 - 01/17/2019 Chemotherapy   The patient had eriBULin mesylate (HALAVEN) 2.1 mg in sodium chloride 0.9 % 100 mL chemo infusion, 1.2 mg/m2 = 2.1 mg (100 % of original dose 1.2 mg/m2), Intravenous,  Once, 3 of 4 cycles Dose modification: 1.2 mg/m2 (original dose 1.2 mg/m2, Cycle 1, Reason: Provider Judgment), 0.8 mg/m2 (original dose 1.2 mg/m2, Cycle 3, Reason: Provider Judgment) Administration: 2.1 mg (11/22/2018), 2.1 mg (11/29/2018), 2.1 mg (12/20/2018), 1.4 mg (01/17/2019)  for chemotherapy treatment.    03/30/2019 - 07/12/2019 Chemotherapy   The patient had pegfilgrastim (NEULASTA ONPRO KIT) injection 6 mg, 6 mg, Subcutaneous, Once, 5 of 8 cycles Administration: 6 mg (03/30/2019), 6 mg (04/20/2019), 6 mg (05/11/2019), 6 mg (06/22/2019), 6 mg (  06/01/2019) ixabepilone (IXEMPRA) 60 mg in lactated ringers 250 mL chemo infusion, 33.5 mg/m2 = 57 mg (100 % of original dose 32 mg/m2), Intravenous,  Once, 5 of 8 cycles Dose modification: 32 mg/m2 (original dose 32 mg/m2, Cycle 1, Reason:  Provider Judgment) Administration: 72 mg (04/20/2019), 72 mg (05/11/2019), 72 mg (06/22/2019), 72 mg (06/01/2019)  for chemotherapy treatment.    08/18/2019 -  Chemotherapy   The patient had PACLitaxel (TAXOL) 150 mg in sodium chloride 0.9 % 250 mL chemo infusion (</= 50m/m2), 80 mg/m2 = 150 mg, Intravenous,  Once, 1 of 4 cycles Administration: 150 mg (08/18/2019)  for chemotherapy treatment.      INTERVAL HISTORY:  BROME [id number].[id number].  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  Patient denies any worsening tingling and numbness in extremities.  She continues to chronic tingling and numbness.  No falls.  Her appetite is fair.  No nausea or vomiting.  No worsening abdominal pain or constipation.  She denies any chest pain.  Denies any palpitations.  Patient denies taking her morning heart medications  Review of Systems  Constitutional: Positive for malaise/fatigue. Negative for chills, diaphoresis, fever and weight loss.  HENT: Negative for nosebleeds and sore throat.   Eyes: Negative for double vision.  Respiratory: Negative for cough, hemoptysis, sputum production, shortness of breath and wheezing.   Cardiovascular: Negative for chest pain, palpitations, orthopnea and leg swelling.  Gastrointestinal: Negative for abdominal pain, blood in stool, constipation, heartburn, melena, nausea and vomiting.  Genitourinary: Negative for dysuria, frequency and urgency.  Musculoskeletal: Positive for joint pain. Negative for back pain.  Skin: Negative for itching.  Neurological: Positive for tingling. Negative for dizziness, focal weakness, weakness and headaches.  Endo/Heme/Allergies: Does not bruise/bleed easily.  Psychiatric/Behavioral: Negative for depression. The patient is not nervous/anxious and does not have insomnia.     PAST MEDICAL HISTORY :   Past Medical History:  Diagnosis Date  . Chemotherapy-induced peripheral neuropathy ([place]   . Epistaxis    a. 11/2016 in setting of asa/plavix-->silver nitrate cauterization.  . GI bleed    a. 11/2016 Admission w/ GIB and hypovolemic shock req 3u PRBC's;  b. 11/2016 ECG: gastritis & nonbleeding peptic ulcer; c. 11/2016 Conlonoscopy: rectal and sigmoid colonic ulcers.  . Heart attack ([place]    a. 1998 Cath @ [facility]: reportedly no intervention required.  .Marland KitchenHerceptin-induced cardiomyopathy ([place]    a. In the setting of Herceptin Rx for breast cancer (initiated 12/2014); b. 03/2015 MUGA EF 64%; b. 08/2015 MUGA: EF 51%; c. 10/2015 MUGA: EF 44%; d. 11/2015 Echo: EF 45-50%; e. 01/2016 MUGA: EF 60%; f. 06/2016 MUGA EF 65%; g. 10/2016 MUGA: EF 61%;  h. 12/2016 Echo: EF 55-60%, gr1 DD.  .Marland KitchenHyperlipidemia   . Hypertension   . Neuropathy   . Possible PAD (peripheral artery disease) ([place]    a. 11/2016 LE cyanosis and weak pulses-->CTA w/o significant Ao-BiFem dzs. ? distal dzs-->ASA/Plavix initiated by vascular surgery.  .Marland KitchenPSVT (paroxysmal supraventricular tachycardia) ([place]    a. Dx 11/2016.  . Pulmonary embolism ([place]    a. 12/2016 CTA Chest: small nonocclusive PE in inferior segment of the Left lingula, somewhat eccentric filling defect suggesting chronic rather than acute embolic event; b. 48/8416LE U/S:  No DVT; c. 12/2016 Echo: Nl RV fxn, nl PASP.  .Marland KitchenRecurrent Metastatic breast cancer ([place]    a. Dx 2016: Stage  II, ER positive, PR positive, HER-2/neu overexpressing of the left breast-->chemo/radiation; b. 10/2016 CT Abd/pelvis: diffuse liver mets, ill defined sclerotic bone lesions-T12;  c. 10/2016 MRI brain: metastatic lesion along L temporal lobe (19x70m) w/ extensive surrounding edema & 534mmidline shift to right.  . Sinus tachycardia     PAST SURGICAL HISTORY :   Past Surgical History:  Procedure Laterality Date  . BREAST BIOPSY Left 2016   Positive  . BREAST LUMPECTOMY WITH SENTINEL LYMPH NODE BIOPSY Left  05/23/2015   Procedure: LEFT BREAST WIDE EXCISION WITH AXILLARY DISSECTION, MASTOPLASTY ;  Surgeon: JeRobert BellowMD;  Location: [facility] ORS;  Service: General;  Laterality: Left;  . BREAST SURGERY Left 12/18/14   breast biopsy/INVASIVE DUCTAL CARCINOMA OF BREAST, NOTTINGHAM GRADE 2.  . Marland KitchenREAST SURGERY  05/23/2015.   Wide excision/mastoplasty, axillary dissection. No residual invasive cancer, positive for residual DCIS. 0/2 nodes identified on axillary dissection. (no SLN by technetium or methylene blue)  . CARDIAC CATHETERIZATION    . COLONOSCOPY WITH PROPOFOL N/A 12/10/2016   Procedure: COLONOSCOPY WITH PROPOFOL;  Surgeon: DaLucilla LameMD;  Location: [facility] ENDOSCOPY;  Service: Endoscopy;  Laterality: N/A;  . COLONOSCOPY WITH PROPOFOL N/A 02/13/2017   Procedure: COLONOSCOPY WITH PROPOFOL;  Surgeon: WoLucilla LameMD;  [facility];  Service: Endoscopy;  Laterality: N/A;  . ESOPHAGOGASTRODUODENOSCOPY (EGD) WITH PROPOFOL N/A 12/08/2016   Procedure: ESOPHAGOGASTRODUODENOSCOPY (EGD) WITH PROPOFOL;  Surgeon: DaLucilla LameMD;  Location: [facility] ENDOSCOPY;  Service: Endoscopy;  Laterality: N/A;  . IVC FILTER INSERTION N/A 02/15/2017   Procedure: IVC Filter Insertion;  Surgeon: DeAlgernon HuxleyMD;  [facility];  Service: Cardiovascular;  Laterality: N/A;  . PORTACATH PLACEMENT Right 12-31-14   Dr ByBary Castilla  FAMILY HISTORY :   Family History  Problem Relation Age of Onset  . Breast cancer Maternal Aunt   . Breast cancer Cousin   . Brain cancer Maternal Uncle   . Diabetes Mother   . Hypertension Mother   . Stroke Mother     SOCIAL HISTORY:   Social History   Tobacco Use  . Smoking status: Light Tobacco Smoker    Packs/day: 0.50    Years: 18.00    Pack years: 9.00    Types: Cigarettes  . Smokeless tobacco: Never Used  Substance Use Topics  . Alcohol use: No    Alcohol/week: 0.0 standard drinks  . Drug use: No    ALLERGIES:  is allergic to no known  allergies.  MEDICATIONS:  Current Outpatient Medications  Medication Sig Dispense Refill  . dexamethasone (DECADRON) 2 MG tablet Take 1 pill BID [morning and afternoon] (Patient not taking: Reported on 08/28/2019) 45 tablet 0  . dexamethasone (DECADRON) 4 MG tablet Take 1 tablet (4 mg total) by mouth 2 (two) times daily. 20 tablet 0  . flecainide (TAMBOCOR) 50 MG tablet Take 1 tablet (50 mg total) by mouth 2 (two) times daily. 60 tablet 4  . magnesium oxide (MAG-OX) 400 MG tablet Take 1 tablet (400 mg total) by mouth 2 (two) times daily. 60 tablet 5  . meclizine (ANTIVERT) 25 MG tablet Take 1 tablet (25 mg total) by mouth 3 (three) times daily as needed for dizziness. 30 tablet 0  . pregabalin (LYRICA) 75 MG capsule Take 1 capsule (75 mg total) by mouth 2 (two) times daily. 180 capsule 0  . Vitamin D, Ergocalciferol, (DRISDOL) 1.25 MG (50000 UT) CAPS capsule TAKE 1 CAPSULE (50,000 UNITS TOTAL) ONCE A WEEK BY  MOUTH. 12 capsule 1   No current facility-administered medications for this visit.    Facility-Administered Medications Ordered in Other Visits  Medication Dose Route Frequency Provider Last Rate Last Dose  . 0.9 %  sodium chloride infusion   Intravenous Continuous Pandit, Sandeep, MD      . 0.9 %  sodium chloride infusion   Intravenous Continuous Lloyd Huger, MD 999 mL/hr at 04/24/15 1520    . heparin lock flush 100 unit/mL  500 Units Intravenous Once Berenzon, Dmitriy, MD      . sodium chloride 0.9 % injection 10 mL  10 mL Intravenous PRN [name], MD   10 mL at 04/04/15 1440  . sodium chloride flush (NS) 0.9 % injection 10 mL  10 mL Intravenous PRN Berenzon, Dmitriy, MD        PHYSICAL EXAMINATION: ECOG PERFORMANCE STATUS: 1 - Symptomatic but completely ambulatory  There were no vitals taken for this visit.  There were no vitals filed for this visit.  Physical Exam  Constitutional: She is oriented to person, place, and time and well-developed, well-nourished, and  in no distress.  Alone.  Walking herself.  HENT:  Head: Normocephalic and atraumatic.  Mouth/Throat: Oropharynx is clear and moist. No oropharyngeal exudate.  Eyes: Pupils are equal, round, and reactive to light.  Neck: Normal range of motion. Neck supple.  Cardiovascular: Normal rate and regular rhythm.  Pulmonary/Chest: No respiratory distress. She has no wheezes.  Abdominal: Soft. Bowel sounds are normal. She exhibits no distension and no mass. There is no abdominal tenderness. There is no rebound and no guarding.  Musculoskeletal: Normal range of motion.        General: No tenderness or edema.  Neurological: She is alert and oriented to person, place, and time.  Skin: Skin is warm.  Psychiatric: Affect normal.    LABORATORY DATA:  I have reviewed the data as listed    Component Value Date/Time   NA 138 08/31/2019 0809   NA 135 03/04/2017 0918   NA 135 01/08/2015 0850   K 3.9 08/31/2019 0809   K 3.7 01/08/2015 0850   CL 103 08/31/2019 0809   CL 101 01/08/2015 0850   CO2 24 08/31/2019 0809   CO2 26 01/08/2015 0850   GLUCOSE 76 08/31/2019 0809   GLUCOSE 161 (H) 01/08/2015 0850   BUN 7 (L) 08/31/2019 0809   BUN 8 03/04/2017 0918   BUN 17 01/08/2015 0850   CREATININE 0.73 08/31/2019 0809   CREATININE 0.82 01/22/2015 1559   CALCIUM 8.7 (L) 08/31/2019 0809   CALCIUM 9.3 01/08/2015 0850   PROT 7.3 08/31/2019 0809   PROT 6.4 03/04/2017 0918   PROT 6.8 01/22/2015 1559   ALBUMIN 3.5 08/31/2019 0809   ALBUMIN 2.5 (L) 03/04/2017 0918   ALBUMIN 3.9 01/22/2015 1559   AST 71 (H) 08/31/2019 0809   AST 34 01/22/2015 1559   ALT 70 (H) 08/31/2019 0809   ALT 42 01/22/2015 1559   ALKPHOS 197 (H) 08/31/2019 0809   ALKPHOS 157 (H) 01/22/2015 1559   BILITOT 0.8 08/31/2019 0809   BILITOT 6.2 (H) 03/04/2017 0918   BILITOT 0.2 (L) 01/22/2015 1559   GFRNONAA >60 08/31/2019 0809   GFRNONAA >60 01/22/2015 1559   GFRAA >60 08/31/2019 0809   GFRAA >60 01/22/2015 1559    No results  found for: SPEP, UPEP  Lab Results  Component Value Date   WBC 3.8 (L) 08/31/2019   NEUTROABS 2.7 08/31/2019   HGB 13.6 08/31/2019  HCT 41.8 08/31/2019   MCV 94.8 08/31/2019   PLT 286 08/31/2019      Chemistry      Component Value Date/Time   NA 138 08/31/2019 0809   NA 135 03/04/2017 0918   NA 135 01/08/2015 0850   K 3.9 08/31/2019 0809   K 3.7 01/08/2015 0850   CL 103 08/31/2019 0809   CL 101 01/08/2015 0850   CO2 24 08/31/2019 0809   CO2 26 01/08/2015 0850   BUN 7 (L) 08/31/2019 0809   BUN 8 03/04/2017 0918   BUN 17 01/08/2015 0850   CREATININE 0.73 08/31/2019 0809   CREATININE 0.82 01/22/2015 1559      Component Value Date/Time   CALCIUM 8.7 (L) 08/31/2019 0809   CALCIUM 9.3 01/08/2015 0850   ALKPHOS 197 (H) 08/31/2019 0809   ALKPHOS 157 (H) 01/22/2015 1559   AST 71 (H) 08/31/2019 0809   AST 34 01/22/2015 1559   ALT 70 (H) 08/31/2019 0809   ALT 42 01/22/2015 1559   BILITOT 0.8 08/31/2019 0809   BILITOT 6.2 (H) 03/04/2017 0918   BILITOT 0.2 (L) 01/22/2015 1559       RADIOGRAPHIC STUDIES: I have personally reviewed the radiological images as listed and agreed with the findings in the report. No results found.   ASSESSMENT & PLAN:  Carcinoma of upper-outer quadrant of left breast in female, estrogen receptor positive ([place]) # Recurrent metastatic breast cancer-ER PR positive HER-2 negative; NOV 2020- PROGRESSION in liver- multiple enhancing lesions noted. Currently on Taxol weekly chemo.   # HOLD # 2 treatment today; see below sec to tachycardia.Otthewise,  Labs today reviewed;  acceptable for treatment.    # tachycardia 120s/ Hx of SVT- recommend taking Tambocor prior to treatment.   # Elevated LFTs- ? Etiology sec to underlying cancer- Improving.   # Brain metastases;  May 06, 2019 BRAIN MRI shows at least 5 new lesions [largest 10 mm]-Recurrent-s/p RT finshed 11/17.  Will repeat MRI in late dec 2020. STABLE.   # Peripheral neuropathy grade 1-2-  on  Lyrica; STABLE.   # DISPOSITION:  # hold Taxol today;  # Taxol tomorrow.  # dec 9th labs- cbc/cmp; Taxol- Dr.B  No orders of the defined types were placed in this encounter.  All questions were answered. The patient knows to call the clinic with any problems, questions or concerns.     Cammie Sickle, MD 09/06/2019 8:24 AM

## 2019-09-07 ENCOUNTER — Other Ambulatory Visit: Payer: Self-pay

## 2019-09-08 ENCOUNTER — Other Ambulatory Visit: Payer: Self-pay

## 2019-09-08 ENCOUNTER — Inpatient Hospital Stay: Payer: Medicare HMO | Admitting: *Deleted

## 2019-09-08 ENCOUNTER — Inpatient Hospital Stay: Payer: Medicare HMO

## 2019-09-08 ENCOUNTER — Inpatient Hospital Stay (HOSPITAL_BASED_OUTPATIENT_CLINIC_OR_DEPARTMENT_OTHER): Payer: Medicare HMO | Admitting: Internal Medicine

## 2019-09-08 DIAGNOSIS — Z170 Estrogen receptor positive status [ER+]: Secondary | ICD-10-CM | POA: Diagnosis not present

## 2019-09-08 DIAGNOSIS — C50412 Malignant neoplasm of upper-outer quadrant of left female breast: Secondary | ICD-10-CM

## 2019-09-08 DIAGNOSIS — Z9221 Personal history of antineoplastic chemotherapy: Secondary | ICD-10-CM | POA: Diagnosis not present

## 2019-09-08 DIAGNOSIS — C7931 Secondary malignant neoplasm of brain: Secondary | ICD-10-CM | POA: Diagnosis not present

## 2019-09-08 DIAGNOSIS — Z95828 Presence of other vascular implants and grafts: Secondary | ICD-10-CM

## 2019-09-08 DIAGNOSIS — Z923 Personal history of irradiation: Secondary | ICD-10-CM | POA: Diagnosis not present

## 2019-09-08 DIAGNOSIS — Z7189 Other specified counseling: Secondary | ICD-10-CM

## 2019-09-08 DIAGNOSIS — C787 Secondary malignant neoplasm of liver and intrahepatic bile duct: Secondary | ICD-10-CM | POA: Diagnosis not present

## 2019-09-08 DIAGNOSIS — M8580 Other specified disorders of bone density and structure, unspecified site: Secondary | ICD-10-CM | POA: Diagnosis not present

## 2019-09-08 DIAGNOSIS — Z5111 Encounter for antineoplastic chemotherapy: Secondary | ICD-10-CM | POA: Diagnosis not present

## 2019-09-08 DIAGNOSIS — C7951 Secondary malignant neoplasm of bone: Secondary | ICD-10-CM | POA: Diagnosis not present

## 2019-09-08 LAB — CBC WITH DIFFERENTIAL/PLATELET
Abs Immature Granulocytes: 0.17 10*3/uL — ABNORMAL HIGH (ref 0.00–0.07)
Basophils Absolute: 0 10*3/uL (ref 0.0–0.1)
Basophils Relative: 0 %
Eosinophils Absolute: 0 10*3/uL (ref 0.0–0.5)
Eosinophils Relative: 0 %
HCT: 44.5 % (ref 36.0–46.0)
Hemoglobin: 14.4 g/dL (ref 12.0–15.0)
Immature Granulocytes: 1 %
Lymphocytes Relative: 6 %
Lymphs Abs: 0.8 10*3/uL (ref 0.7–4.0)
MCH: 30.4 pg (ref 26.0–34.0)
MCHC: 32.4 g/dL (ref 30.0–36.0)
MCV: 94.1 fL (ref 80.0–100.0)
Monocytes Absolute: 0.7 10*3/uL (ref 0.1–1.0)
Monocytes Relative: 5 %
Neutro Abs: 12.1 10*3/uL — ABNORMAL HIGH (ref 1.7–7.7)
Neutrophils Relative %: 88 %
Platelets: 341 10*3/uL (ref 150–400)
RBC: 4.73 MIL/uL (ref 3.87–5.11)
RDW: 14.4 % (ref 11.5–15.5)
WBC: 13.7 10*3/uL — ABNORMAL HIGH (ref 4.0–10.5)
nRBC: 0 % (ref 0.0–0.2)

## 2019-09-08 LAB — COMPREHENSIVE METABOLIC PANEL
ALT: 190 U/L — ABNORMAL HIGH (ref 0–44)
AST: 115 U/L — ABNORMAL HIGH (ref 15–41)
Albumin: 3.4 g/dL — ABNORMAL LOW (ref 3.5–5.0)
Alkaline Phosphatase: 212 U/L — ABNORMAL HIGH (ref 38–126)
Anion gap: 11 (ref 5–15)
BUN: 29 mg/dL — ABNORMAL HIGH (ref 8–23)
CO2: 25 mmol/L (ref 22–32)
Calcium: 8.8 mg/dL — ABNORMAL LOW (ref 8.9–10.3)
Chloride: 100 mmol/L (ref 98–111)
Creatinine, Ser: 0.89 mg/dL (ref 0.44–1.00)
GFR calc Af Amer: >60 mL/min (ref >60)
GFR calc non Af Amer: >60 mL/min (ref >60)
Glucose, Bld: 207 mg/dL — ABNORMAL HIGH (ref 70–99)
Potassium: 3.6 mmol/L (ref 3.5–5.1)
Sodium: 136 mmol/L (ref 135–145)
Total Bilirubin: 0.5 mg/dL (ref 0.3–1.2)
Total Protein: 6.9 g/dL (ref 6.5–8.1)

## 2019-09-08 MED ORDER — MONTELUKAST SODIUM 10 MG PO TABS: 10.0000 mg | ORAL_TABLET | Freq: Every day | ORAL | 3 refills | Status: DC

## 2019-09-08 MED ORDER — SODIUM CHLORIDE 0.9 % IV SOLN
80.0000 mg/m2 | Freq: Once | INTRAVENOUS | Status: AC
  Administered 2019-09-08: 150 mg via INTRAVENOUS
  Filled 2019-09-08: qty 25

## 2019-09-08 MED ORDER — SODIUM CHLORIDE 0.9 % IV SOLN
Freq: Once | INTRAVENOUS | Status: AC
  Administered 2019-09-08: 10:00:00 via INTRAVENOUS
  Filled 2019-09-08: qty 250

## 2019-09-08 MED ORDER — DEXAMETHASONE 2 MG PO TABS: ORAL_TABLET | ORAL | 0 refills | Status: DC

## 2019-09-08 MED ORDER — HEPARIN SOD (PORK) LOCK FLUSH 100 UNIT/ML IV SOLN
500.0000 [IU] | Freq: Once | INTRAVENOUS | Status: AC | PRN
  Administered 2019-09-08: 500 [IU]
  Filled 2019-09-08: qty 5

## 2019-09-08 MED ORDER — DIPHENHYDRAMINE HCL 50 MG/ML IJ SOLN
50.0000 mg | Freq: Once | INTRAMUSCULAR | Status: AC
  Administered 2019-09-08: 50 mg via INTRAVENOUS
  Filled 2019-09-08: qty 1

## 2019-09-08 MED ORDER — SODIUM CHLORIDE 0.9% FLUSH
10.0000 mL | Freq: Once | INTRAVENOUS | Status: AC
  Administered 2019-09-08: 10 mL via INTRAVENOUS
  Filled 2019-09-08: qty 10

## 2019-09-08 MED ORDER — DEXAMETHASONE SODIUM PHOSPHATE 10 MG/ML IJ SOLN
10.0000 mg | Freq: Once | INTRAMUSCULAR | Status: AC
  Administered 2019-09-08: 10 mg via INTRAVENOUS
  Filled 2019-09-08: qty 1

## 2019-09-08 MED ORDER — SODIUM CHLORIDE 0.9 % IV SOLN: 10.0000 mg | Freq: Once | INTRAVENOUS | Status: DC

## 2019-09-08 MED ORDER — FAMOTIDINE IN NACL 20-0.9 MG/50ML-% IV SOLN
20.0000 mg | Freq: Once | INTRAVENOUS | Status: AC
  Administered 2019-09-08: 20 mg via INTRAVENOUS
  Filled 2019-09-08: qty 50

## 2019-09-08 NOTE — Progress Notes (Signed)
[place] OFFICE PROGRESS NOTE  Patient Care Team: Lada, Satira Anis, MD as PCP - General (Family Medicine) Wellington Hampshire, MD as PCP - Cardiology (Cardiology) Byrnett, Forest Gleason, MD (General Surgery) [name], MD (Inactive) as Attending Physician (Internal Medicine)  Cancer Staging No matching staging information was found for the patient.   Oncology History Overview Note  # LEFT BREAST IDC; STAGE II [cT2N1] ER >90%; PR- 50-90%; her 2 NEU- POS; s/p Neoadj chemo; AUG 2016- TCH+P s/p Lumpec & partial ALND- path CR; s/p RT [finished Nov 2016];  adj Herceptin; HELD for Jan 19th 2017 [in DEC 2016-EF dropped from 63 to 51%; FEB 27th EF-42.9%]; JAN 2016 START Arimidex; May 2017- EF- 60%; May 24th 2017-Re-start Herceptin q 3W; July 28th STOP herceptin [finished 42m/m2; sec to Low EF; however 2017 Oct EF= 67%; improved]  # DEC 4th 2017- Start Neratinib 4 pills; DEC 11th 3 pills; STOPPED.  # FEB 2018- RECURRENCE ER/PR positive; Her 2 NEGATIVE [liver Bx]  # MARCH 1st 2018- Tax-Cytoxan [poor tol]  # FEB 2018- Brain mets [SBRT; Dr.Crystal; finished Feb 28th 2018]  # March 2018- Eribulin- poor tolerance  # June 8th 2018- Started Faslodex + Abema [abema-multiple interruptions sec to neutropenia]  # MRI Brain- March 2019- Leptomeningeal mets [WBRT; No LP s/p WBRT- 01/09/2018]; STOP Abema [549mday-sec to severe cytopenia]+faslodex  # MAY 20tth 019- Start Xeloda; STOPPED in Nov 22nd 2019- sec to Progressive liver lesions on CT scan  # NOV 26th 2019- Faslodex + Piqaray 25029m; Jan 2020- Piqray 300 mg+ faslodex; FEB 17th CT scan- mixed response/ Overall progression; STOP Piqray + faslodex.   # FEB 25th 2020- ERIBULIN; Poor tolerance sec to severe neutropenia.  Stopped May 2020.  CT scan May 2020-new/progressive 2 cm liver lesion;   #July nd 2020-Ixempra x5 cycles-; NOV 2020-MRI liver- worsening liver lesions [rising AST/ALT].   # NOV [id number] weekly [consent-];    #May 06, 2019-MRI brain 5 new lesions [3 mm to 10 mm]-asymptomatic; [previous whole brain radiation]- OCT 2020- Re-Irradiation.   # PN- G-2; may 2017- Cymbalta 49m78mMARCH 2018-  acute vascular insuff of BIL LE [? Taxotere vasoconstriction on asprin]; # hemorragic shock LGIB [s/p colo; Dr.Wohl]  #  SVT- on flecainide.   # April 2016- Liver Bx- NEG;   # Drop in EF from Herceptin [recovered OCt 27th- EF 65%]  # BMD- jan 2017- osteopenia  # 2020- SEP-s/p Pallaitive care evaluation  -----------------------------------------------------------     [place]- PDL-1 0%/NEG for BRCA; MSI-Stable; Positive for PI3K; ccnd-1; FGF** -----------------------------------------------------------  Dx: Metastatic Breast cancer [ER/PR-Pos; her -2 NEG] Stage IV; goals: palliative Current treatment- Taxol   Carcinoma of upper-outer quadrant of left breast in female, estrogen receptor positive (HCC)[place]/25/2020 - 01/17/2019 Chemotherapy   The patient had eriBULin mesylate (HALAVEN) 2.1 mg in sodium chloride 0.9 % 100 mL chemo infusion, 1.2 mg/m2 = 2.1 mg (100 % of original dose 1.2 mg/m2), Intravenous,  Once, 3 of 4 cycles Dose modification: 1.2 mg/m2 (original dose 1.2 mg/m2, Cycle 1, Reason: Provider Judgment), 0.8 mg/m2 (original dose 1.2 mg/m2, Cycle 3, Reason: Provider Judgment) Administration: 2.1 mg (11/22/2018), 2.1 mg (11/29/2018), 2.1 mg (12/20/2018), 1.4 mg (01/17/2019)  for chemotherapy treatment.    03/30/2019 - 07/12/2019 Chemotherapy   The patient had pegfilgrastim (NEULASTA ONPRO KIT) injection 6 mg, 6 mg, Subcutaneous, Once, 5 of 8 cycles Administration: 6 mg (03/30/2019), 6 mg (04/20/2019), 6 mg (05/11/2019), 6 mg (06/22/2019), 6  mg (06/01/2019) ixabepilone (IXEMPRA) 60 mg in lactated ringers 250 mL chemo infusion, 33.5 mg/m2 = 57 mg (100 % of original dose 32 mg/m2), Intravenous,  Once, 5 of 8 cycles Dose modification: 32 mg/m2 (original dose 32 mg/m2, Cycle  1, Reason: Provider Judgment) Administration: 72 mg (04/20/2019), 72 mg (05/11/2019), 72 mg (06/22/2019), 72 mg (06/01/2019)  for chemotherapy treatment.    08/18/2019 -  Chemotherapy   The patient had PACLitaxel (TAXOL) 150 mg in sodium chloride 0.9 % 250 mL chemo infusion (</= 86m/m2), 80 mg/m2 = 150 mg, Intravenous,  Once, 1 of 4 cycles Administration: 150 mg (08/18/2019)  for chemotherapy treatment.      INTERVAL HISTORY:  Sarah KUNDA89y.o.  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  Patient's chemotherapy was held last week because of elevated heart rate/dizziness.  Brain MRI did not show any evidence of progression of disease.  Patient is currently on dexamethasone 4 mg twice daily dizziness is improved.  Her heart rate has improved.  He is on Tambocor.  Denies any worsening tingling and numbness next images.  No falls.  No chest pain.  Complains of runny nose.  No fevers. Review of Systems  Constitutional: Positive for malaise/fatigue. Negative for chills, diaphoresis, fever and weight loss.  HENT: Negative for nosebleeds and sore throat.   Eyes: Negative for double vision.  Respiratory: Negative for cough, hemoptysis, sputum production, shortness of breath and wheezing.   Cardiovascular: Negative for chest pain, palpitations, orthopnea and leg swelling.  Gastrointestinal: Negative for abdominal pain, blood in stool, constipation, heartburn, melena, nausea and vomiting.  Genitourinary: Negative for dysuria, frequency and urgency.  Musculoskeletal: Positive for joint pain. Negative for back pain.  Skin: Negative for itching.  Neurological: Positive for tingling. Negative for dizziness, focal weakness, weakness and headaches.  Endo/Heme/Allergies: Does not bruise/bleed easily.  Psychiatric/Behavioral: Negative for depression. The patient is  not nervous/anxious and does not have insomnia.     PAST MEDICAL HISTORY :  Past Medical History:  Diagnosis Date  . Chemotherapy-induced peripheral neuropathy ([place]   . Epistaxis    a. 11/2016 in setting of asa/plavix-->silver nitrate cauterization.  . GI bleed    a. 11/2016 Admission w/ GIB and hypovolemic shock req 3u PRBC's;  b. 11/2016 ECG: gastritis & nonbleeding peptic ulcer; c. 11/2016 Conlonoscopy: rectal and sigmoid colonic ulcers.  . Heart attack ([place]    a. 1998 Cath @ [facility]: reportedly no intervention required.  .Marland KitchenHerceptin-induced cardiomyopathy (H[place]    a. In the setting of Herceptin Rx for breast cancer (initiated 12/2014); b. 03/2015 MUGA EF 64%; b. 08/2015 MUGA: EF 51%; c. 10/2015 MUGA: EF 44%; d. 11/2015 Echo: EF 45-50%; e. 01/2016 MUGA: EF 60%; f. 06/2016 MUGA EF 65%; g. 10/2016 MUGA: EF 61%;  h. 12/2016 Echo: EF 55-60%, gr1 DD.  .Marland KitchenHyperlipidemia   . Hypertension   . Neuropathy   . Possible PAD (peripheral artery disease) ([place]    a. 11/2016 LE cyanosis and weak pulses-->CTA w/o significant Ao-BiFem dzs. ? distal dzs-->ASA/Plavix initiated by vascular surgery.  .Marland KitchenPSVT (paroxysmal supraventricular tachycardia) ([place]    a. Dx 11/2016.  . Pulmonary embolism ([place]    a. 12/2016 CTA Chest: small nonocclusive PE in inferior segment of the Left lingula, somewhat eccentric filling defect suggesting chronic rather than acute embolic event; b. 41/8841LE U/S:  No DVT; c. 12/2016 Echo: Nl RV fxn,  nl PASP.  Marland Kitchen Recurrent Metastatic breast cancer ([place])    a. Dx 2016: Stage II, ER positive, PR positive, HER-2/neu overexpressing of the left breast-->chemo/radiation; b. 10/2016 CT Abd/pelvis: diffuse liver mets, ill defined sclerotic bone lesions-T12;  c. 10/2016 MRI brain: metastatic lesion along L temporal lobe (19x71m) w/ extensive surrounding edema & 513mmidline shift to right.  . Sinus tachycardia     PAST SURGICAL HISTORY :   Past Surgical History:  Procedure Laterality Date  . BREAST BIOPSY  Left 2016   Positive  . BREAST LUMPECTOMY WITH SENTINEL LYMPH NODE BIOPSY Left 05/23/2015   Procedure: LEFT BREAST WIDE EXCISION WITH AXILLARY DISSECTION, MASTOPLASTY ;  Surgeon: JeRobert BellowMD;  Location: [facility] ORS;  Service: General;  Laterality: Left;  . BREAST SURGERY Left 12/18/14   breast biopsy/INVASIVE DUCTAL CARCINOMA OF BREAST, NOTTINGHAM GRADE 2.  . Marland KitchenREAST SURGERY  05/23/2015.   Wide excision/mastoplasty, axillary dissection. No residual invasive cancer, positive for residual DCIS. 0/2 nodes identified on axillary dissection. (no SLN by technetium or methylene blue)  . CARDIAC CATHETERIZATION    . COLONOSCOPY WITH PROPOFOL N/A 12/10/2016   Procedure: COLONOSCOPY WITH PROPOFOL;  Surgeon: DaLucilla LameMD;  Location: [facility] ENDOSCOPY;  Service: Endoscopy;  Laterality: N/A;  . COLONOSCOPY WITH PROPOFOL N/A 02/13/2017   Procedure: COLONOSCOPY WITH PROPOFOL;  Surgeon: WoLucilla LameMD;  [facility];  Service: Endoscopy;  Laterality: N/A;  . ESOPHAGOGASTRODUODENOSCOPY (EGD) WITH PROPOFOL N/A 12/08/2016   Procedure: ESOPHAGOGASTRODUODENOSCOPY (EGD) WITH PROPOFOL;  Surgeon: DaLucilla LameMD;  Location: [facility] END[facility];  Service: Endoscopy;  Laterality: N/A;  . IVC FILTER INSERTION N/A 02/15/2017   Procedure: IVC Filter Insertion;  Surgeon: DeAlgernon HuxleyMD;  [facility];  Service: Cardiovascular;  Laterality: N/A;  . PORTACATH PLACEMENT Right 12-31-14   Dr ByBary Castilla  FAMILY HISTORY :   Family History  Problem Relation Age of Onset  . Breast cancer Maternal Aunt   . Breast cancer Cousin   . Brain cancer Maternal Uncle   . Diabetes Mother   . Hypertension Mother   . Stroke Mother     SOCIAL HISTORY:   Social History   Tobacco Use  . Smoking status: Light Tobacco Smoker    Packs/day: 0.50    Years: 18.00    Pack years: 9.00    Types: Cigarettes  . Smokeless tobacco: Never Used  Substance Use Topics  . Alcohol use: No    Alcohol/week: 0.0 standard  drinks  . Drug use: No    ALLERGIES:  is allergic to no known allergies.  MEDICATIONS:  Current Outpatient Medications  Medication Sig Dispense Refill  . dexamethasone (DECADRON) 2 MG tablet Take 1 pill BID [morning and afternoon] 45 tablet 0  . flecainide (TAMBOCOR) 50 MG tablet Take 1 tablet (50 mg total) by mouth 2 (two) times daily. 60 tablet 4  . magnesium oxide (MAG-OX) 400 MG tablet Take 1 tablet (400 mg total) by mouth 2 (two) times daily. 60 tablet 5  . meclizine (ANTIVERT) 25 MG tablet Take 1 tablet (25 mg total) by mouth 3 (three) times daily as needed for dizziness. 30 tablet 0  . pregabalin (LYRICA) 75 MG capsule Take 1 capsule (75 mg total) by mouth 2 (two) times daily. 180 capsule 0  . Vitamin D, Ergocalciferol, (DRISDOL) 1.25 MG (50000 UT) CAPS capsule TAKE 1 CAPSULE (50,000 UNITS TOTAL) ONCE A WEEK BY MOUTH. 12 capsule 1  . montelukast (SINGULAIR) 10 MG tablet Take  1 tablet (10 mg total) by mouth at bedtime. 60 tablet 3   No current facility-administered medications for this visit.   Facility-Administered Medications Ordered in Other Visits  Medication Dose Route Frequency Provider Last Rate Last Admin  . 0.9 %  sodium chloride infusion   Intravenous Continuous [name], MD   New Bag at 12/10/16 1140  . 0.9 %  sodium chloride infusion   Intravenous Continuous Lloyd Huger, MD 999 mL/hr at 04/24/15 1520 New Bag at 05/23/15 0951  . heparin lock flush 100 unit/mL  500 Units Intravenous Once Berenzon, Dmitriy, MD      . sodium chloride 0.9 % injection 10 mL  10 mL Intravenous PRN [name], MD   10 mL at 04/04/15 1440  . sodium chloride flush (NS) 0.9 % injection 10 mL  10 mL Intravenous PRN Berenzon, Dmitriy, MD        PHYSICAL EXAMINATION: ECOG PERFORMANCE STATUS: 1 - Symptomatic but completely ambulatory  BP (!) 149/87 (BP Location: Left Arm, Patient Position: Sitting)   Pulse 97   Temp 97.9 F (36.6 C) (Tympanic)   Resp 16   Wt 162 lb 12.8 oz  (73.8 kg)   BMI 26.28 kg/m   Filed Weights   09/08/19 0850  Weight: 162 lb 12.8 oz (73.8 kg)    Physical Exam  Constitutional: She is oriented to person, place, and time and well-developed, well-nourished, and in no distress.  Alone.  Walking herself.  HENT:  Head: Normocephalic and atraumatic.  Mouth/Throat: Oropharynx is clear and moist. No oropharyngeal exudate.  Eyes: Pupils are equal, round, and reactive to light.  Cardiovascular: Normal rate and regular rhythm.  Pulmonary/Chest: No respiratory distress. She has no wheezes.  Abdominal: Soft. Bowel sounds are normal. She exhibits no distension and no mass. There is no abdominal tenderness. There is no rebound and no guarding.  Musculoskeletal:        General: No tenderness or edema. Normal range of motion.     Cervical back: Normal range of motion and neck supple.  Neurological: She is alert and oriented to person, place, and time.  Skin: Skin is warm.  Psychiatric: Affect normal.    LABORATORY DATA:  I have reviewed the data as listed    Component Value Date/Time   NA 136 09/08/2019 0800   NA 135 03/04/2017 0918   NA 135 01/08/2015 0850   K 3.6 09/08/2019 0800   K 3.7 01/08/2015 0850   CL 100 09/08/2019 0800   CL 101 01/08/2015 0850   CO2 25 09/08/2019 0800   CO2 26 01/08/2015 0850   GLUCOSE 207 (H) 09/08/2019 0800   GLUCOSE 161 (H) 01/08/2015 0850   BUN 29 (H) 09/08/2019 0800   BUN 8 03/04/2017 0918   BUN 17 01/08/2015 0850   CREATININE 0.89 09/08/2019 0800   CREATININE 0.82 01/22/2015 1559   CALCIUM 8.8 (L) 09/08/2019 0800   CALCIUM 9.3 01/08/2015 0850   PROT 6.9 09/08/2019 0800   PROT 6.4 03/04/2017 0918   PROT 6.8 01/22/2015 1559   ALBUMIN 3.4 (L) 09/08/2019 0800   ALBUMIN 2.5 (L) 03/04/2017 0918   ALBUMIN 3.9 01/22/2015 1559   AST 115 (H) 09/08/2019 0800   AST 34 01/22/2015 1559   ALT 190 (H) 09/08/2019 0800   ALT 42 01/22/2015 1559   ALKPHOS 212 (H) 09/08/2019 0800   ALKPHOS 157 (H) 01/22/2015  1559   BILITOT 0.5 09/08/2019 0800   BILITOT 6.2 (H) 03/04/2017 0918   BILITOT 0.2 (  L) 01/22/2015 1559   GFRNONAA >60 09/08/2019 0800   GFRNONAA >60 01/22/2015 1559   GFRAA >60 09/08/2019 0800   GFRAA >60 01/22/2015 1559    No results found for: SPEP, UPEP  Lab Results  Component Value Date   WBC 13.7 (H) 09/08/2019   NEUTROABS 12.1 (H) 09/08/2019   HGB 14.4 09/08/2019   HCT 44.5 09/08/2019   MCV 94.1 09/08/2019   PLT 341 09/08/2019      Chemistry      Component Value Date/Time   NA 136 09/08/2019 0800   NA 135 03/04/2017 0918   NA 135 01/08/2015 0850   K 3.6 09/08/2019 0800   K 3.7 01/08/2015 0850   CL 100 09/08/2019 0800   CL 101 01/08/2015 0850   CO2 25 09/08/2019 0800   CO2 26 01/08/2015 0850   BUN 29 (H) 09/08/2019 0800   BUN 8 03/04/2017 0918   BUN 17 01/08/2015 0850   CREATININE 0.89 09/08/2019 0800   CREATININE 0.82 01/22/2015 1559      Component Value Date/Time   CALCIUM 8.8 (L) 09/08/2019 0800   CALCIUM 9.3 01/08/2015 0850   ALKPHOS 212 (H) 09/08/2019 0800   ALKPHOS 157 (H) 01/22/2015 1559   AST 115 (H) 09/08/2019 0800   AST 34 01/22/2015 1559   ALT 190 (H) 09/08/2019 0800   ALT 42 01/22/2015 1559   BILITOT 0.5 09/08/2019 0800   BILITOT 6.2 (H) 03/04/2017 0918   BILITOT 0.2 (L) 01/22/2015 1559       RADIOGRAPHIC STUDIES: I have personally reviewed the radiological images as listed and agreed with the findings in the report. No results found.   ASSESSMENT & PLAN:  Carcinoma of upper-outer quadrant of left breast in female, estrogen receptor positive ([place]) # Recurrent metastatic breast cancer-ER PR positive HER-2 negative; NOV 2020- PROGRESSION in liver- multiple enhancing lesions noted. Currently on Taxol weekly chemo.   # proceed with chemo/Taxol today; Labs today reviewed;  acceptable for treatment today- except for LFTs/see below.   # Elevated LFTs- ? Etiology sec to underlying cancer- proceed with chemo;    # Brain metastases;  May 06, 2019 BRAIN MRI shows at least 5 new lesions [largest 10 mm]-Recurrent-s/p RT finshed 11/17.  MRI- improved; finish dex today. Taper dex 2 mg BID; further taper at next visit.   # Tachycardia 120s/ Hx of SVT-improved today at 97- STABLE.   # Peripheral neuropathy grade 1-2-  on Lyrica; STABLE.    # Rhinorrhea- ? Allergies- on flonase; start singulair.   # DISPOSITION:  # Taxol today # 1 week- MD;labs- cbc/cmp-Taxol-Dr.B  No orders of the defined types were placed in this encounter.  All questions were answered. The patient knows to call the clinic with any problems, questions or concerns.     Cammie Sickle, MD 09/08/2019 9:23 AM

## 2019-09-08 NOTE — Patient Instructions (Signed)
#   Singulair for allergies # Dexamtehasone 2 mg twice a day.

## 2019-09-08 NOTE — Assessment & Plan Note (Addendum)
#  Recurrent metastatic breast cancer-ER PR positive HER-2 negative; NOV 2020- PROGRESSION in liver- multiple enhancing lesions noted. Currently on Taxol weekly chemo.   # proceed with chemo/Taxol today; Labs today reviewed;  acceptable for treatment today- except for LFTs/see below.   # Elevated LFTs- ? Etiology sec to underlying cancer- proceed with chemo;    # Brain metastases;  May 06, 2019 BRAIN MRI shows at least 5 new lesions [largest 10 mm]-Recurrent-s/p RT finshed 11/17.  MRI- improved; finish dex today. Taper dex 2 mg BID; further taper at next visit.   # Tachycardia 120s/ Hx of SVT-improved today at 97- STABLE.   # Peripheral neuropathy grade 1-2-  on Lyrica; STABLE.    # Rhinorrhea- ? Allergies- on flonase; start singulair.   # DISPOSITION:  # Taxol today # 1 week- MD;labs- cbc/cmp-Taxol-Dr.B

## 2019-09-13 ENCOUNTER — Other Ambulatory Visit: Payer: Self-pay | Admitting: *Deleted

## 2019-09-13 MED ORDER — IPRATROPIUM BROMIDE 0.03 % NA SOLN: 2.0000 | Freq: Two times a day (BID) | NASAL | 12 refills | Status: DC

## 2019-09-13 NOTE — Telephone Encounter (Signed)
Patient sent request for Ipratroprium 0.03% spray for this patient complaining of a runny nose. Please Advise

## 2019-09-14 ENCOUNTER — Other Ambulatory Visit: Payer: Self-pay

## 2019-09-14 ENCOUNTER — Encounter: Payer: Self-pay | Admitting: Internal Medicine

## 2019-09-15 ENCOUNTER — Inpatient Hospital Stay: Payer: Medicare HMO

## 2019-09-15 ENCOUNTER — Inpatient Hospital Stay (HOSPITAL_BASED_OUTPATIENT_CLINIC_OR_DEPARTMENT_OTHER): Payer: Medicare HMO | Admitting: Internal Medicine

## 2019-09-15 ENCOUNTER — Other Ambulatory Visit: Payer: Self-pay

## 2019-09-15 DIAGNOSIS — Z170 Estrogen receptor positive status [ER+]: Secondary | ICD-10-CM

## 2019-09-15 DIAGNOSIS — C7931 Secondary malignant neoplasm of brain: Secondary | ICD-10-CM | POA: Diagnosis not present

## 2019-09-15 DIAGNOSIS — Z9221 Personal history of antineoplastic chemotherapy: Secondary | ICD-10-CM | POA: Diagnosis not present

## 2019-09-15 DIAGNOSIS — Z5111 Encounter for antineoplastic chemotherapy: Secondary | ICD-10-CM | POA: Diagnosis not present

## 2019-09-15 DIAGNOSIS — C50412 Malignant neoplasm of upper-outer quadrant of left female breast: Secondary | ICD-10-CM

## 2019-09-15 DIAGNOSIS — C7951 Secondary malignant neoplasm of bone: Secondary | ICD-10-CM | POA: Diagnosis not present

## 2019-09-15 DIAGNOSIS — M8580 Other specified disorders of bone density and structure, unspecified site: Secondary | ICD-10-CM | POA: Diagnosis not present

## 2019-09-15 DIAGNOSIS — C787 Secondary malignant neoplasm of liver and intrahepatic bile duct: Secondary | ICD-10-CM | POA: Diagnosis not present

## 2019-09-15 DIAGNOSIS — Z7189 Other specified counseling: Secondary | ICD-10-CM

## 2019-09-15 DIAGNOSIS — Z923 Personal history of irradiation: Secondary | ICD-10-CM | POA: Diagnosis not present

## 2019-09-15 LAB — CBC WITH DIFFERENTIAL/PLATELET
Abs Immature Granulocytes: 0.08 10*3/uL — ABNORMAL HIGH (ref 0.00–0.07)
Basophils Absolute: 0 10*3/uL (ref 0.0–0.1)
Basophils Relative: 0 %
Eosinophils Absolute: 0 10*3/uL (ref 0.0–0.5)
Eosinophils Relative: 0 %
HCT: 40.9 % (ref 36.0–46.0)
Hemoglobin: 13.6 g/dL (ref 12.0–15.0)
Immature Granulocytes: 1 %
Lymphocytes Relative: 8 %
Lymphs Abs: 0.7 10*3/uL (ref 0.7–4.0)
MCH: 31 pg (ref 26.0–34.0)
MCHC: 33.3 g/dL (ref 30.0–36.0)
MCV: 93.2 fL (ref 80.0–100.0)
Monocytes Absolute: 0.3 10*3/uL (ref 0.1–1.0)
Monocytes Relative: 4 %
Neutro Abs: 7.4 10*3/uL (ref 1.7–7.7)
Neutrophils Relative %: 87 %
Platelets: 217 10*3/uL (ref 150–400)
RBC: 4.39 MIL/uL (ref 3.87–5.11)
RDW: 15.1 % (ref 11.5–15.5)
WBC: 8.6 10*3/uL (ref 4.0–10.5)
nRBC: 0 % (ref 0.0–0.2)

## 2019-09-15 LAB — COMPREHENSIVE METABOLIC PANEL
ALT: 248 U/L — ABNORMAL HIGH (ref 0–44)
AST: 98 U/L — ABNORMAL HIGH (ref 15–41)
Albumin: 3.3 g/dL — ABNORMAL LOW (ref 3.5–5.0)
Alkaline Phosphatase: 184 U/L — ABNORMAL HIGH (ref 38–126)
Anion gap: 8 (ref 5–15)
BUN: 22 mg/dL (ref 8–23)
CO2: 27 mmol/L (ref 22–32)
Calcium: 8.9 mg/dL (ref 8.9–10.3)
Chloride: 102 mmol/L (ref 98–111)
Creatinine, Ser: 0.8 mg/dL (ref 0.44–1.00)
GFR calc Af Amer: >60 mL/min (ref >60)
GFR calc non Af Amer: >60 mL/min (ref >60)
Glucose, Bld: 86 mg/dL (ref 70–99)
Potassium: 4.1 mmol/L (ref 3.5–5.1)
Sodium: 137 mmol/L (ref 135–145)
Total Bilirubin: 1.2 mg/dL (ref 0.3–1.2)
Total Protein: 6.4 g/dL — ABNORMAL LOW (ref 6.5–8.1)

## 2019-09-15 MED ORDER — HEPARIN SOD (PORK) LOCK FLUSH 100 UNIT/ML IV SOLN
500.0000 [IU] | Freq: Once | INTRAVENOUS | Status: AC | PRN
  Administered 2019-09-15: 500 [IU]
  Filled 2019-09-15: qty 5

## 2019-09-15 MED ORDER — NYSTATIN 100000 UNIT/ML MT SUSP: 5.0000 mL | Freq: Four times a day (QID) | OROMUCOSAL | 0 refills | Status: DC

## 2019-09-15 MED ORDER — HEPARIN SOD (PORK) LOCK FLUSH 100 UNIT/ML IV SOLN
INTRAVENOUS | Status: AC
  Filled 2019-09-15: qty 5

## 2019-09-15 MED ORDER — FAMOTIDINE IN NACL 20-0.9 MG/50ML-% IV SOLN
20.0000 mg | Freq: Once | INTRAVENOUS | Status: AC
  Administered 2019-09-15: 20 mg via INTRAVENOUS
  Filled 2019-09-15: qty 50

## 2019-09-15 MED ORDER — SODIUM CHLORIDE 0.9 % IV SOLN
80.0000 mg/m2 | Freq: Once | INTRAVENOUS | Status: AC
  Administered 2019-09-15: 150 mg via INTRAVENOUS
  Filled 2019-09-15: qty 25

## 2019-09-15 MED ORDER — SODIUM CHLORIDE 0.9 % IV SOLN
Freq: Once | INTRAVENOUS | Status: AC
  Filled 2019-09-15: qty 250

## 2019-09-15 MED ORDER — DEXAMETHASONE SODIUM PHOSPHATE 10 MG/ML IJ SOLN
10.0000 mg | Freq: Once | INTRAMUSCULAR | Status: AC
  Administered 2019-09-15: 10 mg via INTRAVENOUS
  Filled 2019-09-15: qty 1

## 2019-09-15 MED ORDER — DIPHENHYDRAMINE HCL 50 MG/ML IJ SOLN
50.0000 mg | Freq: Once | INTRAMUSCULAR | Status: AC
  Administered 2019-09-15: 50 mg via INTRAVENOUS
  Filled 2019-09-15: qty 1

## 2019-09-15 MED ORDER — SODIUM CHLORIDE 0.9% FLUSH
10.0000 mL | INTRAVENOUS | Status: DC | PRN
  Administered 2019-09-15: 10 mL
  Filled 2019-09-15: qty 10

## 2019-09-15 NOTE — Patient Instructions (Signed)
#   Starting today- Take dexamethasone 2 mg- one a day x 7; then then 1/2 pill a day. # Using nystatin mouth wash as directed.

## 2019-09-15 NOTE — Progress Notes (Signed)
[place] OFFICE PROGRESS NOTE  Patient Care Team: Lada, Satira Anis, MD as PCP - General (Family Medicine) Wellington Hampshire, MD as PCP - Cardiology (Cardiology) Byrnett, Forest Gleason, MD (General Surgery) [name], MD (Inactive) as Attending Physician (Internal Medicine)  Cancer Staging No matching staging information was found for the patient.   Oncology History Overview Note  # LEFT BREAST IDC; STAGE II [cT2N1] ER >90%; PR- 50-90%; her 2 NEU- POS; s/p Neoadj chemo; AUG 2016- TCH+P s/p Lumpec & partial ALND- path CR; s/p RT [finished Nov 2016];  adj Herceptin; HELD for Jan 19th 2017 [in DEC 2016-EF dropped from 63 to 51%; FEB 27th EF-42.9%]; JAN 2016 START Arimidex; May 2017- EF- 60%; May 24th 2017-Re-start Herceptin q 3W; July 28th STOP herceptin [finished 42m/m2; sec to Low EF; however 2017 Oct EF= 67%; improved]  # DEC 4th 2017- Start Neratinib 4 pills; DEC 11th 3 pills; STOPPED.  # FEB 2018- RECURRENCE ER/PR positive; Her 2 NEGATIVE [liver Bx]  # MARCH 1st 2018- Tax-Cytoxan [poor tol]  # FEB 2018- Brain mets [SBRT; Dr.Crystal; finished Feb 28th 2018]  # March 2018- Eribulin- poor tolerance  # June 8th 2018- Started Faslodex + Abema [abema-multiple interruptions sec to neutropenia]  # MRI Brain- March 2019- Leptomeningeal mets [WBRT; No LP s/p WBRT- 01/09/2018]; STOP Abema [549mday-sec to severe cytopenia]+faslodex  # MAY 20tth 019- Start Xeloda; STOPPED in Nov 22nd 2019- sec to Progressive liver lesions on CT scan  # NOV 26th 2019- Faslodex + Piqaray 25029m; Jan 2020- Piqray 300 mg+ faslodex; FEB 17th CT scan- mixed response/ Overall progression; STOP Piqray + faslodex.   # FEB 25th 2020- ERIBULIN; Poor tolerance sec to severe neutropenia.  Stopped May 2020.  CT scan May 2020-new/progressive 2 cm liver lesion;   #July nd 2020-Ixempra x5 cycles-; NOV 2020-MRI liver- worsening liver lesions [rising AST/ALT].   # NOV [id number] weekly [consent-];    #May 06, 2019-MRI brain 5 new lesions [3 mm to 10 mm]-asymptomatic; [previous whole brain radiation]- OCT 2020- Re-Irradiation.   # PN- G-2; may 2017- Cymbalta 49m78mMARCH 2018-  acute vascular insuff of BIL LE [? Taxotere vasoconstriction on asprin]; # hemorragic shock LGIB [s/p colo; Dr.Wohl]  #  SVT- on flecainide.   # April 2016- Liver Bx- NEG;   # Drop in EF from Herceptin [recovered OCt 27th- EF 65%]  # BMD- jan 2017- osteopenia  # 2020- SEP-s/p Pallaitive care evaluation  -----------------------------------------------------------     [place]- PDL-1 0%/NEG for BRCA; MSI-Stable; Positive for PI3K; ccnd-1; FGF** -----------------------------------------------------------  Dx: Metastatic Breast cancer [ER/PR-Pos; her -2 NEG] Stage IV; goals: palliative Current treatment- Taxol   Carcinoma of upper-outer quadrant of left breast in female, estrogen receptor positive (HCC)[place]/25/2020 - 01/17/2019 Chemotherapy   The patient had eriBULin mesylate (HALAVEN) 2.1 mg in sodium chloride 0.9 % 100 mL chemo infusion, 1.2 mg/m2 = 2.1 mg (100 % of original dose 1.2 mg/m2), Intravenous,  Once, 3 of 4 cycles Dose modification: 1.2 mg/m2 (original dose 1.2 mg/m2, Cycle 1, Reason: Provider Judgment), 0.8 mg/m2 (original dose 1.2 mg/m2, Cycle 3, Reason: Provider Judgment) Administration: 2.1 mg (11/22/2018), 2.1 mg (11/29/2018), 2.1 mg (12/20/2018), 1.4 mg (01/17/2019)  for chemotherapy treatment.    03/30/2019 - 07/12/2019 Chemotherapy   The patient had pegfilgrastim (NEULASTA ONPRO KIT) injection 6 mg, 6 mg, Subcutaneous, Once, 5 of 8 cycles Administration: 6 mg (03/30/2019), 6 mg (04/20/2019), 6 mg (05/11/2019), 6 mg (06/22/2019), 6  mg (06/01/2019) ixabepilone (IXEMPRA) 60 mg in lactated ringers 250 mL chemo infusion, 33.5 mg/m2 = 57 mg (100 % of original dose 32 mg/m2), Intravenous,  Once, 5 of 8 cycles Dose modification: 32 mg/m2 (original dose 32 mg/m2, Cycle  1, Reason: Provider Judgment) Administration: 72 mg (04/20/2019), 72 mg (05/11/2019), 72 mg (06/22/2019), 72 mg (06/01/2019)  for chemotherapy treatment.    08/18/2019 -  Chemotherapy   The patient had PACLitaxel (TAXOL) 150 mg in sodium chloride 0.9 % 250 mL chemo infusion (</= '80mg'$ /m2), 80 mg/m2 = 150 mg, Intravenous,  Once, 1 of 4 cycles Administration: 150 mg (08/18/2019), 150 mg (09/08/2019), 150 mg (09/15/2019)  for chemotherapy treatment.      INTERVAL HISTORY:  ANGELIKI MATES 63 y.o.  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  Patient's chemotherapy was interrupted because of dizziness; MRI brain showed improved brain metastasis.  She is currently on dexamethasone 2 mg twice a day.  Complains of hoarseness of voice.  Complains of soreness in the mouth.  Otherwise no nausea no vomiting.  No abdominal pain.  No fevers or chills.  Chronic tingling in the extremities.  Review of Systems  Constitutional: Positive for malaise/fatigue. Negative for chills, diaphoresis, fever and weight loss.  HENT: Negative for nosebleeds and sore throat.   Eyes: Negative for double vision.  Respiratory: Negative for cough, hemoptysis, sputum production, shortness of breath and wheezing.   Cardiovascular: Negative for chest pain, palpitations, orthopnea and leg swelling.  Gastrointestinal: Negative for abdominal pain, blood in stool, constipation, heartburn, melena, nausea and vomiting.  Genitourinary: Negative for dysuria, frequency and urgency.  Musculoskeletal: Positive for joint pain. Negative for back pain.  Skin: Negative for itching.  Neurological: Positive for tingling. Negative for dizziness, focal weakness, weakness and headaches.  Endo/Heme/Allergies: Does not bruise/bleed easily.  Psychiatric/Behavioral: Negative for depression. The patient is not  nervous/anxious and does not have insomnia.     PAST MEDICAL HISTORY :  Past Medical History:  Diagnosis Date  . Chemotherapy-induced peripheral neuropathy ([place])   . Epistaxis    a. 11/2016 in setting of asa/plavix-->silver nitrate cauterization.  . GI bleed    a. 11/2016 Admission w/ GIB and hypovolemic shock req 3u PRBC's;  b. 11/2016 ECG: gastritis & nonbleeding peptic ulcer; c. 11/2016 Conlonoscopy: rectal and sigmoid colonic ulcers.  . Heart attack ([place])    a. 1998 Cath @ [facility]: reportedly no intervention required.  Marland Kitchen Herceptin-induced cardiomyopathy ([place])    a. In the setting of Herceptin Rx for breast cancer (initiated 12/2014); b. 03/2015 MUGA EF 64%; b. 08/2015 MUGA: EF 51%; c. 10/2015 MUGA: EF 44%; d. 11/2015 Echo: EF 45-50%; e. 01/2016 MUGA: EF 60%; f. 06/2016 MUGA EF 65%; g. 10/2016 MUGA: EF 61%;  h. 12/2016 Echo: EF 55-60%, gr1 DD.  Marland Kitchen Hyperlipidemia   . Hypertension   . Neuropathy   . Possible PAD (peripheral artery disease) ([place])    a. 11/2016 LE cyanosis and weak pulses-->CTA w/o significant Ao-BiFem dzs. ? distal dzs-->ASA/Plavix initiated by vascular surgery.  Marland Kitchen PSVT (paroxysmal supraventricular tachycardia) ([place])    a. Dx 11/2016.  . Pulmonary embolism ([place])    a. 12/2016 CTA Chest: small nonocclusive PE in inferior segment of the Left lingula, somewhat eccentric filling defect suggesting chronic rather than acute embolic event; b. 09/1550 LE U/S:  No DVT; c. 12/2016 Echo: Nl RV fxn, nl PASP.  Marland Kitchen Recurrent Metastatic  breast cancer ([place])    a. Dx 2016: Stage II, ER positive, PR positive, HER-2/neu overexpressing of the left breast-->chemo/radiation; b. 10/2016 CT Abd/pelvis: diffuse liver mets, ill defined sclerotic bone lesions-T12;  c. 10/2016 MRI brain: metastatic lesion along L temporal lobe (19x46m) w/ extensive surrounding edema & 57mmidline shift to right.  . Sinus tachycardia     PAST SURGICAL HISTORY :   Past Surgical History:  Procedure Laterality Date  . BREAST BIOPSY Left  2016   Positive  . BREAST LUMPECTOMY WITH SENTINEL LYMPH NODE BIOPSY Left 05/23/2015   Procedure: LEFT BREAST WIDE EXCISION WITH AXILLARY DISSECTION, MASTOPLASTY ;  Surgeon: JeRobert BellowMD;  Location: [facility] ORS;  Service: General;  Laterality: Left;  . BREAST SURGERY Left 12/18/14   breast biopsy/INVASIVE DUCTAL CARCINOMA OF BREAST, NOTTINGHAM GRADE 2.  . Marland KitchenREAST SURGERY  05/23/2015.   Wide excision/mastoplasty, axillary dissection. No residual invasive cancer, positive for residual DCIS. 0/2 nodes identified on axillary dissection. (no SLN by technetium or methylene blue)  . CARDIAC CATHETERIZATION    . COLONOSCOPY WITH PROPOFOL N/A 12/10/2016   Procedure: COLONOSCOPY WITH PROPOFOL;  Surgeon: DaLucilla LameMD;  Location: [facility] ENDOSCOPY;  Service: Endoscopy;  Laterality: N/A;  . COLONOSCOPY WITH PROPOFOL N/A 02/13/2017   Procedure: COLONOSCOPY WITH PROPOFOL;  Surgeon: WoLucilla LameMD;  [facility];  Service: Endoscopy;  Laterality: N/A;  . ESOPHAGOGASTRODUODENOSCOPY (EGD) WITH PROPOFOL N/A 12/08/2016   Procedure: ESOPHAGOGASTRODUODENOSCOPY (EGD) WITH PROPOFOL;  Surgeon: DaLucilla LameMD;  Location: [facility] END[facility]COPY;  Service: Endoscopy;  Laterality: N/A;  . IVC FILTER INSERTION N/A 02/15/2017   Procedure: IVC Filter Insertion;  Surgeon: DeAlgernon HuxleyMD;  [facility];  Service: Cardiovascular;  Laterality: N/A;  . PORTACATH PLACEMENT Right 12-31-14   Dr ByBary Castilla  FAMILY HISTORY :   Family History  Problem Relation Age of Onset  . Breast cancer Maternal Aunt   . Breast cancer Cousin   . Brain cancer Maternal Uncle   . Diabetes Mother   . Hypertension Mother   . Stroke Mother     SOCIAL HISTORY:   Social History   Tobacco Use  . Smoking status: Light Tobacco Smoker    Packs/day: 0.50    Years: 18.00    Pack years: 9.00    Types: Cigarettes  . Smokeless tobacco: Never Used  Substance Use Topics  . Alcohol use: No    Alcohol/week: 0.0 standard drinks   . Drug use: No    ALLERGIES:  is allergic to no known allergies.  MEDICATIONS:  Current Outpatient Medications  Medication Sig Dispense Refill  . dexamethasone (DECADRON) 2 MG tablet Take 1 pill BID [morning and afternoon] 45 tablet 0  . flecainide (TAMBOCOR) 50 MG tablet Take 1 tablet (50 mg total) by mouth 2 (two) times daily. 60 tablet 4  . ipratropium (ATROVENT) 0.03 % nasal spray Place 2 sprays into both nostrils every 12 (twelve) hours. 30 mL 12  . magnesium oxide (MAG-OX) 400 MG tablet Take 1 tablet (400 mg total) by mouth 2 (two) times daily. 60 tablet 5  . meclizine (ANTIVERT) 25 MG tablet Take 1 tablet (25 mg total) by mouth 3 (three) times daily as needed for dizziness. 30 tablet 0  . montelukast (SINGULAIR) 10 MG tablet Take 1 tablet (10 mg total) by mouth at bedtime. 60 tablet 3  . pregabalin (LYRICA) 75 MG capsule Take 1 capsule (75 mg total) by mouth 2 (two) times daily. 180 capsule  0  . Vitamin D, Ergocalciferol, (DRISDOL) 1.25 MG (50000 UT) CAPS capsule TAKE 1 CAPSULE (50,000 UNITS TOTAL) ONCE A WEEK BY MOUTH. 12 capsule 1  . nystatin (MYCOSTATIN) 100000 UNIT/ML suspension Take 5 mLs (500,000 Units total) by mouth 4 (four) times daily. 60 mL 0   No current facility-administered medications for this visit.   Facility-Administered Medications Ordered in Other Visits  Medication Dose Route Frequency Provider Last Rate Last Admin  . 0.9 %  sodium chloride infusion   Intravenous Continuous [name], MD   New Bag at 12/10/16 1140  . 0.9 %  sodium chloride infusion   Intravenous Continuous Lloyd Huger, MD 999 mL/hr at 04/24/15 1520 New Bag at 05/23/15 0951  . heparin lock flush 100 unit/mL  500 Units Intravenous Once Berenzon, Dmitriy, MD      . sodium chloride 0.9 % injection 10 mL  10 mL Intravenous PRN [name], MD   10 mL at 04/04/15 1440  . sodium chloride flush (NS) 0.9 % injection 10 mL  10 mL Intravenous PRN Berenzon, Dmitriy, MD      . sodium  chloride flush (NS) 0.9 % injection 10 mL  10 mL Intracatheter PRN Cammie Sickle, MD   10 mL at 09/15/19 0904    PHYSICAL EXAMINATION: ECOG PERFORMANCE STATUS: 1 - Symptomatic but completely ambulatory  BP 134/87 (BP Location: Right Arm, Patient Position: Sitting)   Pulse 81   Temp (!) 96 F (35.6 C) (Tympanic)   Resp 18   Wt 165 lb (74.8 kg)   BMI 26.63 kg/m   Filed Weights   09/15/19 0921  Weight: 165 lb (74.8 kg)    Physical Exam  Constitutional: She is oriented to person, place, and time and well-developed, well-nourished, and in no distress.  Alone.  Walking herself.  HENT:  Head: Normocephalic and atraumatic.  Mouth/Throat: Oropharynx is clear and moist. No oropharyngeal exudate.  Oral thrush.   Eyes: Pupils are equal, round, and reactive to light.  Cardiovascular: Normal rate and regular rhythm.  Pulmonary/Chest: Effort normal and breath sounds normal. No respiratory distress. She has no wheezes.  Abdominal: Soft. Bowel sounds are normal. She exhibits no distension and no mass. There is no abdominal tenderness. There is no rebound and no guarding.  Musculoskeletal:        General: No tenderness or edema. Normal range of motion.     Cervical back: Normal range of motion and neck supple.  Neurological: She is alert and oriented to person, place, and time.  Skin: Skin is warm.  Psychiatric: Affect normal.    LABORATORY DATA:  I have reviewed the data as listed    Component Value Date/Time   NA 137 09/15/2019 0904   NA 135 03/04/2017 0918   NA 135 01/08/2015 0850   K 4.1 09/15/2019 0904   K 3.7 01/08/2015 0850   CL 102 09/15/2019 0904   CL 101 01/08/2015 0850   CO2 27 09/15/2019 0904   CO2 26 01/08/2015 0850   GLUCOSE 86 09/15/2019 0904   GLUCOSE 161 (H) 01/08/2015 0850   BUN 22 09/15/2019 0904   BUN 8 03/04/2017 0918   BUN 17 01/08/2015 0850   CREATININE 0.80 09/15/2019 0904   CREATININE 0.82 01/22/2015 1559   CALCIUM 8.9 09/15/2019 0904    CALCIUM 9.3 01/08/2015 0850   PROT 6.4 (L) 09/15/2019 0904   PROT 6.4 03/04/2017 0918   PROT 6.8 01/22/2015 1559   ALBUMIN 3.3 (L) 09/15/2019 0354  ALBUMIN 2.5 (L) 03/04/2017 0918   ALBUMIN 3.9 01/22/2015 1559   AST 98 (H) 09/15/2019 0904   AST 34 01/22/2015 1559   ALT 248 (H) 09/15/2019 0904   ALT 42 01/22/2015 1559   ALKPHOS 184 (H) 09/15/2019 0904   ALKPHOS 157 (H) 01/22/2015 1559   BILITOT 1.2 09/15/2019 0904   BILITOT 6.2 (H) 03/04/2017 0918   BILITOT 0.2 (L) 01/22/2015 1559   GFRNONAA >60 09/15/2019 0904   GFRNONAA >60 01/22/2015 1559   GFRAA >60 09/15/2019 0904   GFRAA >60 01/22/2015 1559    No results found for: SPEP, UPEP  Lab Results  Component Value Date   WBC 8.6 09/15/2019   NEUTROABS 7.4 09/15/2019   HGB 13.6 09/15/2019   HCT 40.9 09/15/2019   MCV 93.2 09/15/2019   PLT 217 09/15/2019      Chemistry      Component Value Date/Time   NA 137 09/15/2019 0904   NA 135 03/04/2017 0918   NA 135 01/08/2015 0850   K 4.1 09/15/2019 0904   K 3.7 01/08/2015 0850   CL 102 09/15/2019 0904   CL 101 01/08/2015 0850   CO2 27 09/15/2019 0904   CO2 26 01/08/2015 0850   BUN 22 09/15/2019 0904   BUN 8 03/04/2017 0918   BUN 17 01/08/2015 0850   CREATININE 0.80 09/15/2019 0904   CREATININE 0.82 01/22/2015 1559      Component Value Date/Time   CALCIUM 8.9 09/15/2019 0904   CALCIUM 9.3 01/08/2015 0850   ALKPHOS 184 (H) 09/15/2019 0904   ALKPHOS 157 (H) 01/22/2015 1559   AST 98 (H) 09/15/2019 0904   AST 34 01/22/2015 1559   ALT 248 (H) 09/15/2019 0904   ALT 42 01/22/2015 1559   BILITOT 1.2 09/15/2019 0904   BILITOT 6.2 (H) 03/04/2017 0918   BILITOT 0.2 (L) 01/22/2015 1559       RADIOGRAPHIC STUDIES: I have personally reviewed the radiological images as listed and agreed with the findings in the report. No results found.   ASSESSMENT & PLAN:  Carcinoma of upper-outer quadrant of left breast in female, estrogen receptor positive ([place]) # Recurrent metastatic  breast cancer-ER PR positive HER-2 negative; NOV 2020- PROGRESSION in liver- multiple enhancing lesions noted. Currently on Taxol weekly chemo.   # proceed with chemo/Taxol today; Labs today reviewed;  acceptable for treatment today- except for LFTs/see below.   # Oral thrush-steroids.  Start taper.  Start nystatin/prescription sent  # Elevated LFTs- ? Etiology sec to underlying cancer- proceed with chemo; reviewed with the patient.  # Brain metastases; Recurrent-s/p RT finshed 11/17.  MRI- improved; finish dex today. Taper dex 2 mg to one a day x 7; then then 1/2 pill a day.   # Peripheral neuropathy grade 1-2-  on Lyrica; STABLE.     # Rhinorrhea- ? Allergies vs. Naso- lacrimal gland obstruction- on flonase; on singulair-stable..   # DISPOSITION:  # Taxol today # 31st dec-MD;labs- cbc/cmp-Taxol-Dr.B  No orders of the defined types were placed in this encounter.  All questions were answered. The patient knows to call the clinic with any problems, questions or concerns.     Cammie Sickle, MD 09/15/2019 12:46 PM

## 2019-09-15 NOTE — Assessment & Plan Note (Addendum)
#  Recurrent metastatic breast cancer-ER PR positive HER-2 negative; NOV 2020- PROGRESSION in liver- multiple enhancing lesions noted. Currently on Taxol weekly chemo.   # proceed with chemo/Taxol today; Labs today reviewed;  acceptable for treatment today- except for LFTs/see below.   # Oral thrush-steroids.  Start taper.  Start nystatin/prescription sent  # Elevated LFTs- ? Etiology sec to underlying cancer- proceed with chemo; reviewed with the patient.  # Brain metastases; Recurrent-s/p RT finshed 11/17.  MRI- improved; finish dex today. Taper dex 2 mg to one a day x 7; then then 1/2 pill a day.   # Peripheral neuropathy grade 1-2-  on Lyrica; STABLE.     # Rhinorrhea- ? Allergies vs. Naso- lacrimal gland obstruction- on flonase; on singulair-stable..   # DISPOSITION:  # Taxol today # 31st dec-MD;labs- cbc/cmp-Taxol-Dr.B

## 2019-09-19 ENCOUNTER — Inpatient Hospital Stay (HOSPITAL_BASED_OUTPATIENT_CLINIC_OR_DEPARTMENT_OTHER): Payer: Medicare HMO | Admitting: Hospice and Palliative Medicine

## 2019-09-19 DIAGNOSIS — Z515 Encounter for palliative care: Secondary | ICD-10-CM

## 2019-09-19 NOTE — Progress Notes (Signed)
Was scheduled for a virtual visit today but was unable to reach patient. Will reschedule

## 2019-09-25 ENCOUNTER — Ambulatory Visit: Payer: Medicare HMO | Admitting: Radiation Oncology

## 2019-09-27 ENCOUNTER — Other Ambulatory Visit: Payer: Self-pay

## 2019-09-28 ENCOUNTER — Inpatient Hospital Stay (HOSPITAL_BASED_OUTPATIENT_CLINIC_OR_DEPARTMENT_OTHER): Payer: Medicare HMO | Admitting: Internal Medicine

## 2019-09-28 ENCOUNTER — Other Ambulatory Visit: Payer: Self-pay

## 2019-09-28 ENCOUNTER — Inpatient Hospital Stay: Payer: Medicare HMO

## 2019-09-28 DIAGNOSIS — C787 Secondary malignant neoplasm of liver and intrahepatic bile duct: Secondary | ICD-10-CM | POA: Diagnosis not present

## 2019-09-28 DIAGNOSIS — C50412 Malignant neoplasm of upper-outer quadrant of left female breast: Secondary | ICD-10-CM

## 2019-09-28 DIAGNOSIS — Z170 Estrogen receptor positive status [ER+]: Secondary | ICD-10-CM

## 2019-09-28 DIAGNOSIS — M8580 Other specified disorders of bone density and structure, unspecified site: Secondary | ICD-10-CM | POA: Diagnosis not present

## 2019-09-28 DIAGNOSIS — Z95828 Presence of other vascular implants and grafts: Secondary | ICD-10-CM

## 2019-09-28 DIAGNOSIS — Z923 Personal history of irradiation: Secondary | ICD-10-CM | POA: Diagnosis not present

## 2019-09-28 DIAGNOSIS — C7951 Secondary malignant neoplasm of bone: Secondary | ICD-10-CM | POA: Diagnosis not present

## 2019-09-28 DIAGNOSIS — Z5111 Encounter for antineoplastic chemotherapy: Secondary | ICD-10-CM | POA: Diagnosis not present

## 2019-09-28 DIAGNOSIS — C7931 Secondary malignant neoplasm of brain: Secondary | ICD-10-CM | POA: Diagnosis not present

## 2019-09-28 DIAGNOSIS — Z9221 Personal history of antineoplastic chemotherapy: Secondary | ICD-10-CM | POA: Diagnosis not present

## 2019-09-28 LAB — CBC WITH DIFFERENTIAL/PLATELET
Abs Immature Granulocytes: 0.05 10*3/uL (ref 0.00–0.07)
Basophils Absolute: 0 10*3/uL (ref 0.0–0.1)
Basophils Relative: 0 %
Eosinophils Absolute: 0 10*3/uL (ref 0.0–0.5)
Eosinophils Relative: 0 %
HCT: 40.2 % (ref 36.0–46.0)
Hemoglobin: 12.9 g/dL (ref 12.0–15.0)
Immature Granulocytes: 1 %
Lymphocytes Relative: 9 %
Lymphs Abs: 0.6 10*3/uL — ABNORMAL LOW (ref 0.7–4.0)
MCH: 30.6 pg (ref 26.0–34.0)
MCHC: 32.1 g/dL (ref 30.0–36.0)
MCV: 95.3 fL (ref 80.0–100.0)
Monocytes Absolute: 0.4 10*3/uL (ref 0.1–1.0)
Monocytes Relative: 7 %
Neutro Abs: 5.3 10*3/uL (ref 1.7–7.7)
Neutrophils Relative %: 83 %
Platelets: 210 10*3/uL (ref 150–400)
RBC: 4.22 MIL/uL (ref 3.87–5.11)
RDW: 16.7 % — ABNORMAL HIGH (ref 11.5–15.5)
WBC: 6.3 10*3/uL (ref 4.0–10.5)
nRBC: 0.3 % — ABNORMAL HIGH (ref 0.0–0.2)

## 2019-09-28 LAB — COMPREHENSIVE METABOLIC PANEL
ALT: 184 U/L — ABNORMAL HIGH (ref 0–44)
AST: 117 U/L — ABNORMAL HIGH (ref 15–41)
Albumin: 3.3 g/dL — ABNORMAL LOW (ref 3.5–5.0)
Alkaline Phosphatase: 227 U/L — ABNORMAL HIGH (ref 38–126)
Anion gap: 10 (ref 5–15)
BUN: 17 mg/dL (ref 8–23)
CO2: 24 mmol/L (ref 22–32)
Calcium: 8.4 mg/dL — ABNORMAL LOW (ref 8.9–10.3)
Chloride: 103 mmol/L (ref 98–111)
Creatinine, Ser: 0.83 mg/dL (ref 0.44–1.00)
GFR calc Af Amer: >60 mL/min (ref >60)
GFR calc non Af Amer: >60 mL/min (ref >60)
Glucose, Bld: 196 mg/dL — ABNORMAL HIGH (ref 70–99)
Potassium: 3.6 mmol/L (ref 3.5–5.1)
Sodium: 137 mmol/L (ref 135–145)
Total Bilirubin: 0.7 mg/dL (ref 0.3–1.2)
Total Protein: 7.2 g/dL (ref 6.5–8.1)

## 2019-09-28 MED ORDER — FLUCONAZOLE 100 MG PO TABS: 100.0000 mg | ORAL_TABLET | Freq: Every day | ORAL | 0 refills | Status: DC

## 2019-09-28 MED ORDER — SODIUM CHLORIDE 0.9% FLUSH
10.0000 mL | Freq: Once | INTRAVENOUS | Status: AC
  Administered 2019-09-28: 14:00:00 10 mL via INTRAVENOUS
  Filled 2019-09-28: qty 10

## 2019-09-28 MED ORDER — HEPARIN SOD (PORK) LOCK FLUSH 100 UNIT/ML IV SOLN
500.0000 [IU] | Freq: Once | INTRAVENOUS | Status: AC
  Administered 2019-09-28: 15:00:00 500 [IU]
  Filled 2019-09-28: qty 5

## 2019-09-28 MED ORDER — HEPARIN SOD (PORK) LOCK FLUSH 100 UNIT/ML IV SOLN
INTRAVENOUS | Status: AC
  Filled 2019-09-28: qty 5

## 2019-09-28 NOTE — Progress Notes (Signed)
ON PATHWAY REGIMEN - Breast  No Change  Continue With Treatment as Ordered.     A cycle is every 28 days (3 weeks on and 1 week off):     Nab-paclitaxel (protein bound)   **Always confirm dose/schedule in your pharmacy ordering system**  Patient Characteristics: Distant Metastases or Locoregional Recurrent Disease - Unresected or Locally Advanced Unresectable Disease Progressing after Neoadjuvant and Local Therapies, HER2 Negative/Unknown/Equivocal, ER Positive, Chemotherapy, Third Line and Beyond, Prior or  Contraindicated Anthracycline and Prior Eribulin Therapeutic Status: Distant Metastases BRCA Mutation Status: Absent ER Status: Positive (+) HER2 Status: Negative (-) PR Status: Negative (-) Line of Therapy: Third Line and Beyond Intent of Therapy: Non-Curative / Palliative Intent, Discussed with Patient

## 2019-09-28 NOTE — Assessment & Plan Note (Addendum)
#  Recurrent metastatic breast cancer-ER PR positive HER-2 negative; NOV 2020- PROGRESSION in liver- multiple enhancing lesions noted. Currently on Taxol weekly chemo; worsening-see below  #Worsening LFTs-highly concerning for progressive disease.  I would recommend discontinuation of Taxol.  Recommend proceeding with Adriamycin. Also discussed regarding potential side effects of heart failure/leukemia with Adriamycin.  Last 2D echo-ejection fraction 50 to 60%.  Discussed with the patient the treatments are palliative not curative.  Patient understands given her multiple lines of previous therapies it is quite possible that she might not respond to further lines of chemotherapy.  However she understands the risk and benefits-and wants to proceed with chemotherapy.  Would recommend Adriamycin weekly at this time.  # proceed with chemo/Taxol today; Labs today reviewed;  acceptable for treatment today- except for LFTs/see below.   # Oral thrush- STOP steroids; recommend diflucan 100 mg x14 days /prescription sent.   # Elevated LFTs- ? Etiology sec to underlying cancer-   # Brain metastases; Recurrent-s/p RT finshed 11/17.  MRI- improved; STOP dexamethasone today.   # Peripheral neuropathy grade 1-2-  on Lyrica; stable.  # DISPOSITION:  # HOLD taxol today; de-access # 1 week- MD; labs- cbc/cmp; ca-27-29; CEA; Ca-15-3; adriamycin weekly- Dr.B

## 2019-09-28 NOTE — Progress Notes (Signed)
[facility] OFFICE PROGRESS NOTE  Patient Care Team: Lada, Satira Anis, MD as PCP - General (Family Medicine) Wellington Hampshire, MD as PCP - Cardiology (Cardiology) Byrnett, Forest Gleason, MD (General Surgery) [name], MD (Inactive) as Attending Physician (Internal Medicine)  Cancer Staging No matching staging information was found for the patient.   Oncology History Overview Note  # LEFT BREAST IDC; STAGE II [cT2N1] ER >90%; PR- 50-90%; her 2 NEU- POS; s/p Neoadj chemo; AUG 2016- TCH+P s/p Lumpec & partial ALND- path CR; s/p RT [finished Nov 2016];  adj Herceptin; HELD for Jan 19th 2017 [in DEC 2016-EF dropped from 63 to 51%; FEB 27th EF-42.9%]; JAN 2016 START Arimidex; May 2017- EF- 60%; May 24th 2017-Re-start Herceptin q 3W; July 28th STOP herceptin [finished 28m/m2; sec to Low EF; however 2017 Oct EF= 67%; improved]  # DEC 4th 2017- Start Neratinib 4 pills; DEC 11th 3 pills; STOPPED.  # FEB 2018- RECURRENCE ER/PR positive; Her 2 NEGATIVE [liver Bx]  # MARCH 1st 2018- Tax-Cytoxan [poor tol]  # FEB 2018- Brain mets [SBRT; Dr.Crystal; finished Feb 28th 2018]  # March 2018- Eribulin- poor tolerance  # June 8th 2018- Started Faslodex + Abema [abema-multiple interruptions sec to neutropenia]  # MRI Brain- March 2019- Leptomeningeal mets [WBRT; No LP s/p WBRT- 01/09/2018]; STOP Abema [546mday-sec to severe cytopenia]+faslodex  # MAY 20tth 019- Start Xeloda; STOPPED in Nov 22nd 2019- sec to Progressive liver lesions on CT scan  # NOV 26th 2019- Faslodex + Piqaray 25052m; Jan 2020- Piqray 300 mg+ faslodex; FEB 17th CT scan- mixed response/ Overall progression; STOP Piqray + faslodex.   # FEB 25th 2020- ERIBULIN; Poor tolerance sec to severe neutropenia.  Stopped May 2020.  CT scan May 2020-new/progressive 2 cm liver lesion;   #July nd 2020-Ixempra x5 cycles-; NOV 2020-MRI liver- worsening liver lesions [rising AST/ALT].   # NOV [id number] weekly [consent-];  December 31-2020-progression/rising LFTs.  Stop Taxol  #October 04, 2019-Adriamycin weekly [C]  #May 06, 2019-MRI brain 5 new lesions [3 mm to 10 mm]-asymptomatic; [previous whole brain radiation]- OCT 2020- Re-Irradiation.   # PN- G-2; may 2017- Cymbalta 6m49mMARCH 2018-  acute vascular insuff of BIL LE [? Taxotere vasoconstriction on asprin]; # hemorragic shock LGIB [s/p colo; Dr.Wohl]  #  SVT- on flecainide.   # April 2016- Liver Bx- NEG;   # Drop in EF from Herceptin [recovered OCt 27th- EF 65%]  # BMD- jan 2017- osteopenia  # 2020- SEP-s/p Pallaitive care evaluation  -----------------------------------------------------------     [place]- Sep 4th 2018- PDL-1 0%/NEG for BRCA; MSI-Stable; Positive for PI3K; ccnd-1; FGF** -----------------------------------------------------------  Dx: Metastatic Breast cancer [ER/PR-Pos; her -2 NEG] Stage IV; goals: palliative Current treatment-Adriamycin [C]   Carcinoma of upper-outer quadrant of left breast in female, estrogen receptor positive (HCC)[place]/25/2020 - 01/17/2019 Chemotherapy   The patient had eriBULin mesylate (HALAVEN) 2.1 mg in sodium chloride 0.9 % 100 mL chemo infusion, 1.2 mg/m2 = 2.1 mg (100 % of original dose 1.2 mg/m2), Intravenous,  Once, 3 of 4 cycles Dose modification: 1.2 mg/m2 (original dose 1.2 mg/m2, Cycle 1, Reason: Provider Judgment), 0.8 mg/m2 (original dose 1.2 mg/m2, Cycle 3, Reason: Provider Judgment) Administration: 2.1 mg (11/22/2018), 2.1 mg (11/29/2018), 2.1 mg (12/20/2018), 1.4 mg (01/17/2019)  for chemotherapy treatment.    03/30/2019 - 07/12/2019 Chemotherapy   The patient had pegfilgrastim (NEULASTA ONPRO KIT) injection 6 mg, 6 mg, Subcutaneous, Once, 5 of 8 cycles Administration: 6 mg (  03/30/2019), 6 mg (04/20/2019), 6 mg (05/11/2019), 6 mg (06/22/2019), 6 mg (06/01/2019) ixabepilone (IXEMPRA) 60 mg in lactated ringers 250 mL chemo infusion, 33.5 mg/m2 = 57 mg (100 % of original dose 32  mg/m2), Intravenous,  Once, 5 of 8 cycles Dose modification: 32 mg/m2 (original dose 32 mg/m2, Cycle 1, Reason: Provider Judgment) Administration: 72 mg (04/20/2019), 72 mg (05/11/2019), 72 mg (06/22/2019), 72 mg (06/01/2019)  for chemotherapy treatment.    08/18/2019 - 09/28/2019 Chemotherapy   The patient had PACLitaxel (TAXOL) 150 mg in sodium chloride 0.9 % 250 mL chemo infusion (</= 78m/m2), 80 mg/m2 = 150 mg, Intravenous,  Once, 1 of 4 cycles Administration: 150 mg (08/18/2019), 150 mg (09/08/2019), 150 mg (09/15/2019)  for chemotherapy treatment.    09/28/2019 -  Chemotherapy   The patient had DOXOrubicin (ADRIAMYCIN) chemo injection 38 mg, 20 mg/m2, Intravenous,  Once, 0 of 4 cycles PALONOSETRON HCL INJECTION 0.25 MG/5ML, 0.25 mg, Intravenous,  Once, 0 of 4 cycles  for chemotherapy treatment.      INTERVAL HISTORY:  Sarah [id number].o.  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  Patient continues to complain of intermittent difficulty swallowing.  She continues to be on steroids 2 mg twice a day.  She has finished using nystatin mouthwash.  Complains of swelling of the face.  Mild swelling in the legs.  No fever chills.  No abdominal pain nausea vomiting diarrhea.  Chronic tingling and numbness in extremities.   Review of Systems  Constitutional: Positive for malaise/fatigue. Negative for chills, diaphoresis, fever and weight loss.  HENT: Negative for nosebleeds and sore throat.   Eyes: Negative for double vision.  Respiratory: Negative for cough, hemoptysis, sputum production, shortness of breath and wheezing.   Cardiovascular: Negative for chest pain, palpitations, orthopnea and leg swelling.  Gastrointestinal: Negative for abdominal pain, blood in stool, constipation, heartburn, melena, nausea and vomiting.  Genitourinary: Negative for  dysuria, frequency and urgency.  Musculoskeletal: Positive for joint pain. Negative for back pain.  Skin: Negative for itching.  Neurological: Positive for tingling. Negative for dizziness, focal weakness, weakness and headaches.  Endo/Heme/Allergies: Does not bruise/bleed easily.  Psychiatric/Behavioral: Negative for depression. The patient is not nervous/anxious and does not have insomnia.     PAST MEDICAL HISTORY :  Past Medical History:  Diagnosis Date  . Chemotherapy-induced peripheral neuropathy ([place]   . Epistaxis    a. 11/2016 in setting of asa/plavix-->silver nitrate cauterization.  . GI bleed    a. 11/2016 Admission w/ GIB and hypovolemic shock req 3u PRBC's;  b. 11/2016 ECG: gastritis & nonbleeding peptic ulcer; c. 11/2016 Conlonoscopy: rectal and sigmoid colonic ulcers.  . Heart attack ([place]    a. 1998 Cath @ [facility]: reportedly no intervention required.  .Marland KitchenHerceptin-induced cardiomyopathy ([place]    a. In the setting of Herceptin Rx for breast cancer (initiated 12/2014); b. 03/2015 MUGA EF 64%; b. 08/2015 MUGA: EF 51%; c. 10/2015 MUGA: EF 44%; d. 11/2015 Echo: EF 45-50%; e. 01/2016 MUGA: EF 60%; f. 06/2016 MUGA EF 65%; g. 10/2016 MUGA: EF 61%;  h. 12/2016 Echo: EF 55-60%, gr1 DD.  .Marland KitchenHyperlipidemia   . Hypertension   . Neuropathy   . Possible PAD (peripheral artery disease) ([place]    a. 11/2016 LE cyanosis and weak pulses-->CTA w/o significant Ao-BiFem dzs. ? distal dzs-->ASA/Plavix initiated by vascular surgery.  .Marland KitchenPSVT (paroxysmal supraventricular tachycardia) ([place]  a. Dx 11/2016.  . Pulmonary embolism ([place])    a. 12/2016 CTA Chest: small nonocclusive PE in inferior segment of the Left lingula, somewhat eccentric filling defect suggesting chronic rather than acute embolic event; b. 01/9162 LE U/S:  No DVT; c. 12/2016 Echo: Nl RV fxn, nl PASP.  Marland Kitchen Recurrent Metastatic breast cancer ([place])    a. Dx 2016: Stage II, ER positive, PR positive, HER-2/neu overexpressing of the left  breast-->chemo/radiation; b. 10/2016 CT Abd/pelvis: diffuse liver mets, ill defined sclerotic bone lesions-T12;  c. 10/2016 MRI brain: metastatic lesion along L temporal lobe (19x78m) w/ extensive surrounding edema & 512mmidline shift to right.  . Sinus tachycardia     PAST SURGICAL HISTORY :   Past Surgical History:  Procedure Laterality Date  . BREAST BIOPSY Left 2016   Positive  . BREAST LUMPECTOMY WITH SENTINEL LYMPH NODE BIOPSY Left 05/23/2015   Procedure: LEFT BREAST WIDE EXCISION WITH AXILLARY DISSECTION, MASTOPLASTY ;  Surgeon: JeRobert BellowMD;  Location: [facility] ORS;  Service: General;  Laterality: Left;  . BREAST SURGERY Left 12/18/14   breast biopsy/INVASIVE DUCTAL CARCINOMA OF BREAST, NOTTINGHAM GRADE 2.  . Marland KitchenREAST SURGERY  05/23/2015.   Wide excision/mastoplasty, axillary dissection. No residual invasive cancer, positive for residual DCIS. 0/2 nodes identified on axillary dissection. (no SLN by technetium or methylene blue)  . CARDIAC CATHETERIZATION    . COLONOSCOPY WITH PROPOFOL N/A 12/10/2016   Procedure: COLONOSCOPY WITH PROPOFOL;  Surgeon: DaLucilla LameMD;  Location: [facility] ENDOSCOPY;  Service: Endoscopy;  Laterality: N/A;  . COLONOSCOPY WITH PROPOFOL N/A 02/13/2017   Procedure: COLONOSCOPY WITH PROPOFOL;  Surgeon: WoLucilla LameMD;  [facility];  Service: Endoscopy;  Laterality: N/A;  . ESOPHAGOGASTRODUODENOSCOPY (EGD) WITH PROPOFOL N/A 12/08/2016   Procedure: ESOPHAGOGASTRODUODENOSCOPY (EGD) WITH PROPOFOL;  Surgeon: DaLucilla LameMD;  Location: [facility] ENDOSCOPY;  Service: Endoscopy;  Laterality: N/A;  . IVC FILTER INSERTION N/A 02/15/2017   Procedure: IVC Filter Insertion;  Surgeon: DeAlgernon HuxleyMD;  [facility];  Service: Cardiovascular;  Laterality: N/A;  . PORTACATH PLACEMENT Right 12-31-14   Dr ByBary Castilla  FAMILY HISTORY :   Family History  Problem Relation Age of Onset  . Breast cancer Maternal Aunt   . Breast cancer Cousin   . Brain  cancer Maternal Uncle   . Diabetes Mother   . Hypertension Mother   . Stroke Mother     SOCIAL HISTORY:   Social History   Tobacco Use  . Smoking status: Light Tobacco Smoker    Packs/day: 0.50    Years: 18.00    Pack years: 9.00    Types: Cigarettes  . Smokeless tobacco: Never Used  Substance Use Topics  . Alcohol use: No    Alcohol/week: 0.0 standard drinks  . Drug use: No    ALLERGIES:  is allergic to no known allergies.  MEDICATIONS:  Current Outpatient Medications  Medication Sig Dispense Refill  . dexamethasone (DECADRON) 2 MG tablet Take 1 pill BID [morning and afternoon] 45 tablet 0  . flecainide (TAMBOCOR) 50 MG tablet Take 1 tablet (50 mg total) by mouth 2 (two) times daily. 60 tablet 4  . ipratropium (ATROVENT) 0.03 % nasal spray Place 2 sprays into both nostrils every 12 (twelve) hours. 30 mL 12  . magnesium oxide (MAG-OX) 400 MG tablet Take 1 tablet (400 mg total) by mouth 2 (two) times daily. 60 tablet 5  . montelukast (SINGULAIR) 10 MG tablet Take 1 tablet (10 mg  total) by mouth at bedtime. 60 tablet 3  . pregabalin (LYRICA) 75 MG capsule Take 1 capsule (75 mg total) by mouth 2 (two) times daily. 180 capsule 0  . Vitamin D, Ergocalciferol, (DRISDOL) 1.25 MG (50000 UT) CAPS capsule TAKE 1 CAPSULE (50,000 UNITS TOTAL) ONCE A WEEK BY MOUTH. 12 capsule 1  . fluconazole (DIFLUCAN) 100 MG tablet Take 1 tablet (100 mg total) by mouth daily. 14 tablet 0  . meclizine (ANTIVERT) 25 MG tablet Take 1 tablet (25 mg total) by mouth 3 (three) times daily as needed for dizziness. (Patient not taking: Reported on 09/27/2019) 30 tablet 0   No current facility-administered medications for this visit.   Facility-Administered Medications Ordered in Other Visits  Medication Dose Route Frequency Provider Last Rate Last Admin  . 0.9 %  sodium chloride infusion   Intravenous Continuous [name], MD   New Bag at 12/10/16 1140  . 0.9 %  sodium chloride infusion   Intravenous  Continuous Lloyd Huger, MD 999 mL/hr at 04/24/15 1520 New Bag at 05/23/15 0951  . heparin lock flush 100 unit/mL  500 Units Intravenous Once Berenzon, Dmitriy, MD      . sodium chloride 0.9 % injection 10 mL  10 mL Intravenous PRN [name], MD   10 mL at 04/04/15 1440  . sodium chloride flush (NS) 0.9 % injection 10 mL  10 mL Intravenous PRN Berenzon, Dmitriy, MD        PHYSICAL EXAMINATION: ECOG PERFORMANCE STATUS: 1 - Symptomatic but completely ambulatory  BP (!) 156/107 (BP Location: Right Arm, Patient Position: Sitting, Cuff Size: Normal)   Pulse (!) 128   Temp (!) 97.5 F (36.4 C) (Tympanic)   Wt 168 lb (76.2 kg)   BMI 27.12 kg/m   Filed Weights   09/27/19 1359  Weight: 168 lb (76.2 kg)    Physical Exam  Constitutional: She is oriented to person, place, and time and well-developed, well-nourished, and in no distress.  Alone.  Walking herself.  HENT:  Head: Normocephalic and atraumatic.  Mouth/Throat: Oropharynx is clear and moist. No oropharyngeal exudate.  Oral thrush.   Eyes: Pupils are equal, round, and reactive to light.  Cardiovascular: Normal rate and regular rhythm.  Pulmonary/Chest: Effort normal and breath sounds normal. No respiratory distress. She has no wheezes.  Abdominal: Soft. Bowel sounds are normal. She exhibits no distension and no mass. There is no abdominal tenderness. There is no rebound and no guarding.  Musculoskeletal:        General: No tenderness or edema. Normal range of motion.     Cervical back: Normal range of motion and neck supple.  Neurological: She is alert and oriented to person, place, and time.  Skin: Skin is warm.  Psychiatric: Affect normal.    LABORATORY DATA:  I have reviewed the data as listed    Component Value Date/Time   NA 137 09/28/2019 1355   NA 135 03/04/2017 0918   NA 135 01/08/2015 0850   K 3.6 09/28/2019 1355   K 3.7 01/08/2015 0850   CL 103 09/28/2019 1355   CL 101 01/08/2015 0850   CO2 24  09/28/2019 1355   CO2 26 01/08/2015 0850   GLUCOSE 196 (H) 09/28/2019 1355   GLUCOSE 161 (H) 01/08/2015 0850   BUN 17 09/28/2019 1355   BUN 8 03/04/2017 0918   BUN 17 01/08/2015 0850   CREATININE 0.83 09/28/2019 1355   CREATININE 0.82 01/22/2015 1559   CALCIUM 8.4 (L) 09/28/2019 1355  CALCIUM 9.3 01/08/2015 0850   PROT 7.2 09/28/2019 1355   PROT 6.4 03/04/2017 0918   PROT 6.8 01/22/2015 1559   ALBUMIN 3.3 (L) 09/28/2019 1355   ALBUMIN 2.5 (L) 03/04/2017 0918   ALBUMIN 3.9 01/22/2015 1559   AST 117 (H) 09/28/2019 1355   AST 34 01/22/2015 1559   ALT 184 (H) 09/28/2019 1355   ALT 42 01/22/2015 1559   ALKPHOS 227 (H) 09/28/2019 1355   ALKPHOS 157 (H) 01/22/2015 1559   BILITOT 0.7 09/28/2019 1355   BILITOT 6.2 (H) 03/04/2017 0918   BILITOT 0.2 (L) 01/22/2015 1559   GFRNONAA >60 09/28/2019 1355   GFRNONAA >60 01/22/2015 1559   GFRAA >60 09/28/2019 1355   GFRAA >60 01/22/2015 1559    No results found for: SPEP, UPEP  Lab Results  Component Value Date   WBC 6.3 09/28/2019   NEUTROABS 5.3 09/28/2019   HGB 12.9 09/28/2019   HCT 40.2 09/28/2019   MCV 95.3 09/28/2019   PLT 210 09/28/2019      Chemistry      Component Value Date/Time   NA 137 09/28/2019 1355   NA 135 03/04/2017 0918   NA 135 01/08/2015 0850   K 3.6 09/28/2019 1355   K 3.7 01/08/2015 0850   CL 103 09/28/2019 1355   CL 101 01/08/2015 0850   CO2 24 09/28/2019 1355   CO2 26 01/08/2015 0850   BUN 17 09/28/2019 1355   BUN 8 03/04/2017 0918   BUN 17 01/08/2015 0850   CREATININE 0.83 09/28/2019 1355   CREATININE 0.82 01/22/2015 1559      Component Value Date/Time   CALCIUM 8.4 (L) 09/28/2019 1355   CALCIUM 9.3 01/08/2015 0850   ALKPHOS 227 (H) 09/28/2019 1355   ALKPHOS 157 (H) 01/22/2015 1559   AST 117 (H) 09/28/2019 1355   AST 34 01/22/2015 1559   ALT 184 (H) 09/28/2019 1355   ALT 42 01/22/2015 1559   BILITOT 0.7 09/28/2019 1355   BILITOT 6.2 (H) 03/04/2017 0918   BILITOT 0.2 (L) 01/22/2015 1559        RADIOGRAPHIC STUDIES: I have personally reviewed the radiological images as listed and agreed with the findings in the report. No results found.   ASSESSMENT & PLAN:  Carcinoma of upper-outer quadrant of left breast in female, estrogen receptor positive ([place]) # Recurrent metastatic breast cancer-ER PR positive HER-2 negative; NOV 2020- PROGRESSION in liver- multiple enhancing lesions noted. Currently on Taxol weekly chemo; worsening-see below  #Worsening LFTs-highly concerning for progressive disease.  I would recommend discontinuation of Taxol.  Recommend proceeding with Adriamycin. Also discussed regarding potential side effects of heart failure/leukemia with Adriamycin.  Last 2D echo-ejection fraction 50 to 60%.  Discussed with the patient the treatments are palliative not curative.  Patient understands given her multiple lines of previous therapies it is quite possible that she might not respond to further lines of chemotherapy.  However she understands the risk and benefits-and wants to proceed with chemotherapy.  Would recommend Adriamycin weekly at this time.  # proceed with chemo/Taxol today; Labs today reviewed;  acceptable for treatment today- except for LFTs/see below.   # Oral thrush- STOP steroids; recommend diflucan 100 mg x14 days /prescription sent.   # Elevated LFTs- ? Etiology sec to underlying cancer-   # Brain metastases; Recurrent-s/p RT finshed 11/17.  MRI- improved; STOP dexamethasone today.   # Peripheral neuropathy grade 1-2-  on Lyrica; stable.  # DISPOSITION:  # HOLD taxol today; de-access # 1 week-  MD; labs- cbc/cmp; ca-27-29; CEA; Ca-15-3; adriamycin weekly- Dr.B   No orders of the defined types were placed in this encounter.  All questions were answered. The patient knows to call the clinic with any problems, questions or concerns.     Cammie Sickle, MD 10/02/2019 1:40 PM

## 2019-09-29 DIAGNOSIS — Q394 Esophageal web: Secondary | ICD-10-CM | POA: Diagnosis not present

## 2019-09-29 DIAGNOSIS — K922 Gastrointestinal hemorrhage, unspecified: Secondary | ICD-10-CM | POA: Diagnosis not present

## 2019-09-29 DIAGNOSIS — Z5111 Encounter for antineoplastic chemotherapy: Secondary | ICD-10-CM | POA: Diagnosis present

## 2019-09-29 DIAGNOSIS — I700 Atherosclerosis of aorta: Secondary | ICD-10-CM | POA: Diagnosis not present

## 2019-09-29 DIAGNOSIS — E569 Vitamin deficiency, unspecified: Secondary | ICD-10-CM | POA: Diagnosis not present

## 2019-09-29 DIAGNOSIS — Z86711 Personal history of pulmonary embolism: Secondary | ICD-10-CM | POA: Diagnosis not present

## 2019-09-29 DIAGNOSIS — I82512 Chronic embolism and thrombosis of left femoral vein: Secondary | ICD-10-CM | POA: Diagnosis present

## 2019-09-29 DIAGNOSIS — E871 Hypo-osmolality and hyponatremia: Secondary | ICD-10-CM | POA: Diagnosis present

## 2019-09-29 DIAGNOSIS — Z515 Encounter for palliative care: Secondary | ICD-10-CM | POA: Diagnosis not present

## 2019-09-29 DIAGNOSIS — Z170 Estrogen receptor positive status [ER+]: Secondary | ICD-10-CM | POA: Diagnosis not present

## 2019-09-29 DIAGNOSIS — T451X5D Adverse effect of antineoplastic and immunosuppressive drugs, subsequent encounter: Secondary | ICD-10-CM | POA: Diagnosis not present

## 2019-09-29 DIAGNOSIS — I361 Nonrheumatic tricuspid (valve) insufficiency: Secondary | ICD-10-CM | POA: Diagnosis not present

## 2019-09-29 DIAGNOSIS — M25552 Pain in left hip: Secondary | ICD-10-CM | POA: Diagnosis not present

## 2019-09-29 DIAGNOSIS — C7949 Secondary malignant neoplasm of other parts of nervous system: Secondary | ICD-10-CM | POA: Diagnosis not present

## 2019-09-29 DIAGNOSIS — C182 Malignant neoplasm of ascending colon: Secondary | ICD-10-CM | POA: Diagnosis not present

## 2019-09-29 DIAGNOSIS — R1310 Dysphagia, unspecified: Secondary | ICD-10-CM | POA: Diagnosis present

## 2019-09-29 DIAGNOSIS — I82409 Acute embolism and thrombosis of unspecified deep veins of unspecified lower extremity: Secondary | ICD-10-CM | POA: Diagnosis not present

## 2019-09-29 DIAGNOSIS — Z87898 Personal history of other specified conditions: Secondary | ICD-10-CM | POA: Diagnosis not present

## 2019-09-29 DIAGNOSIS — R531 Weakness: Secondary | ICD-10-CM | POA: Diagnosis present

## 2019-09-29 DIAGNOSIS — K7460 Unspecified cirrhosis of liver: Secondary | ICD-10-CM | POA: Diagnosis not present

## 2019-09-29 DIAGNOSIS — S0990XA Unspecified injury of head, initial encounter: Secondary | ICD-10-CM | POA: Diagnosis not present

## 2019-09-29 DIAGNOSIS — G9009 Other idiopathic peripheral autonomic neuropathy: Secondary | ICD-10-CM | POA: Diagnosis not present

## 2019-09-29 DIAGNOSIS — K5900 Constipation, unspecified: Secondary | ICD-10-CM | POA: Diagnosis not present

## 2019-09-29 DIAGNOSIS — G620 Drug-induced polyneuropathy: Secondary | ICD-10-CM | POA: Diagnosis present

## 2019-09-29 DIAGNOSIS — F17210 Nicotine dependence, cigarettes, uncomplicated: Secondary | ICD-10-CM | POA: Diagnosis present

## 2019-09-29 DIAGNOSIS — I10 Essential (primary) hypertension: Secondary | ICD-10-CM | POA: Diagnosis present

## 2019-09-29 DIAGNOSIS — M6281 Muscle weakness (generalized): Secondary | ICD-10-CM | POA: Diagnosis not present

## 2019-09-29 DIAGNOSIS — C184 Malignant neoplasm of transverse colon: Secondary | ICD-10-CM | POA: Diagnosis not present

## 2019-09-29 DIAGNOSIS — C50919 Malignant neoplasm of unspecified site of unspecified female breast: Secondary | ICD-10-CM | POA: Diagnosis present

## 2019-09-29 DIAGNOSIS — I252 Old myocardial infarction: Secondary | ICD-10-CM | POA: Diagnosis not present

## 2019-09-29 DIAGNOSIS — Z4682 Encounter for fitting and adjustment of non-vascular catheter: Secondary | ICD-10-CM | POA: Diagnosis not present

## 2019-09-29 DIAGNOSIS — H6123 Impacted cerumen, bilateral: Secondary | ICD-10-CM | POA: Diagnosis not present

## 2019-09-29 DIAGNOSIS — S199XXA Unspecified injury of neck, initial encounter: Secondary | ICD-10-CM | POA: Diagnosis not present

## 2019-09-29 DIAGNOSIS — Z9221 Personal history of antineoplastic chemotherapy: Secondary | ICD-10-CM | POA: Diagnosis not present

## 2019-09-29 DIAGNOSIS — I82432 Acute embolism and thrombosis of left popliteal vein: Secondary | ICD-10-CM | POA: Diagnosis present

## 2019-09-29 DIAGNOSIS — M8580 Other specified disorders of bone density and structure, unspecified site: Secondary | ICD-10-CM | POA: Diagnosis not present

## 2019-09-29 DIAGNOSIS — D701 Agranulocytosis secondary to cancer chemotherapy: Secondary | ICD-10-CM | POA: Diagnosis not present

## 2019-09-29 DIAGNOSIS — C7931 Secondary malignant neoplasm of brain: Secondary | ICD-10-CM | POA: Diagnosis present

## 2019-09-29 DIAGNOSIS — M549 Dorsalgia, unspecified: Secondary | ICD-10-CM | POA: Diagnosis not present

## 2019-09-29 DIAGNOSIS — Z0189 Encounter for other specified special examinations: Secondary | ICD-10-CM | POA: Diagnosis not present

## 2019-09-29 DIAGNOSIS — R279 Unspecified lack of coordination: Secondary | ICD-10-CM | POA: Diagnosis not present

## 2019-09-29 DIAGNOSIS — Z808 Family history of malignant neoplasm of other organs or systems: Secondary | ICD-10-CM | POA: Diagnosis not present

## 2019-09-29 DIAGNOSIS — W07XXXA Fall from chair, initial encounter: Secondary | ICD-10-CM | POA: Diagnosis present

## 2019-09-29 DIAGNOSIS — T451X5A Adverse effect of antineoplastic and immunosuppressive drugs, initial encounter: Secondary | ICD-10-CM | POA: Diagnosis present

## 2019-09-29 DIAGNOSIS — H539 Unspecified visual disturbance: Secondary | ICD-10-CM | POA: Diagnosis not present

## 2019-09-29 DIAGNOSIS — R634 Abnormal weight loss: Secondary | ICD-10-CM | POA: Diagnosis not present

## 2019-09-29 DIAGNOSIS — Z7952 Long term (current) use of systemic steroids: Secondary | ICD-10-CM | POA: Diagnosis not present

## 2019-09-29 DIAGNOSIS — D649 Anemia, unspecified: Secondary | ICD-10-CM | POA: Diagnosis not present

## 2019-09-29 DIAGNOSIS — K7689 Other specified diseases of liver: Secondary | ICD-10-CM | POA: Diagnosis not present

## 2019-09-29 DIAGNOSIS — Z791 Long term (current) use of non-steroidal anti-inflammatories (NSAID): Secondary | ICD-10-CM | POA: Diagnosis not present

## 2019-09-29 DIAGNOSIS — L84 Corns and callosities: Secondary | ICD-10-CM | POA: Diagnosis not present

## 2019-09-29 DIAGNOSIS — Z7189 Other specified counseling: Secondary | ICD-10-CM | POA: Diagnosis not present

## 2019-09-29 DIAGNOSIS — R945 Abnormal results of liver function studies: Secondary | ICD-10-CM | POA: Diagnosis not present

## 2019-09-29 DIAGNOSIS — K222 Esophageal obstruction: Secondary | ICD-10-CM | POA: Diagnosis present

## 2019-09-29 DIAGNOSIS — Z0000 Encounter for general adult medical examination without abnormal findings: Secondary | ICD-10-CM | POA: Diagnosis not present

## 2019-09-29 DIAGNOSIS — D259 Leiomyoma of uterus, unspecified: Secondary | ICD-10-CM | POA: Diagnosis not present

## 2019-09-29 DIAGNOSIS — R0902 Hypoxemia: Secondary | ICD-10-CM | POA: Diagnosis not present

## 2019-09-29 DIAGNOSIS — R404 Transient alteration of awareness: Secondary | ICD-10-CM | POA: Diagnosis not present

## 2019-09-29 DIAGNOSIS — R296 Repeated falls: Secondary | ICD-10-CM | POA: Diagnosis not present

## 2019-09-29 DIAGNOSIS — Z01818 Encounter for other preprocedural examination: Secondary | ICD-10-CM | POA: Diagnosis present

## 2019-09-29 DIAGNOSIS — J439 Emphysema, unspecified: Secondary | ICD-10-CM | POA: Diagnosis not present

## 2019-09-29 DIAGNOSIS — Z923 Personal history of irradiation: Secondary | ICD-10-CM | POA: Diagnosis not present

## 2019-09-29 DIAGNOSIS — R7989 Other specified abnormal findings of blood chemistry: Secondary | ICD-10-CM | POA: Diagnosis not present

## 2019-09-29 DIAGNOSIS — I429 Cardiomyopathy, unspecified: Secondary | ICD-10-CM | POA: Diagnosis present

## 2019-09-29 DIAGNOSIS — G629 Polyneuropathy, unspecified: Secondary | ICD-10-CM | POA: Diagnosis not present

## 2019-09-29 DIAGNOSIS — C7802 Secondary malignant neoplasm of left lung: Secondary | ICD-10-CM | POA: Diagnosis present

## 2019-09-29 DIAGNOSIS — J309 Allergic rhinitis, unspecified: Secondary | ICD-10-CM | POA: Diagnosis not present

## 2019-09-29 DIAGNOSIS — G720 Drug-induced myopathy: Secondary | ICD-10-CM | POA: Diagnosis not present

## 2019-09-29 DIAGNOSIS — R2689 Other abnormalities of gait and mobility: Secondary | ICD-10-CM | POA: Diagnosis not present

## 2019-09-29 DIAGNOSIS — Z86718 Personal history of other venous thrombosis and embolism: Secondary | ICD-10-CM | POA: Diagnosis not present

## 2019-09-29 DIAGNOSIS — R5383 Other fatigue: Secondary | ICD-10-CM | POA: Diagnosis not present

## 2019-09-29 DIAGNOSIS — R000 Tachycardia, unspecified: Secondary | ICD-10-CM | POA: Diagnosis not present

## 2019-09-29 DIAGNOSIS — R2241 Localized swelling, mass and lump, right lower limb: Secondary | ICD-10-CM | POA: Diagnosis not present

## 2019-09-29 DIAGNOSIS — Z8679 Personal history of other diseases of the circulatory system: Secondary | ICD-10-CM | POA: Diagnosis not present

## 2019-09-29 DIAGNOSIS — Z7401 Bed confinement status: Secondary | ICD-10-CM | POA: Diagnosis not present

## 2019-09-29 DIAGNOSIS — D61810 Antineoplastic chemotherapy induced pancytopenia: Secondary | ICD-10-CM | POA: Diagnosis present

## 2019-09-29 DIAGNOSIS — D61818 Other pancytopenia: Secondary | ICD-10-CM | POA: Diagnosis present

## 2019-09-29 DIAGNOSIS — E785 Hyperlipidemia, unspecified: Secondary | ICD-10-CM | POA: Diagnosis present

## 2019-09-29 DIAGNOSIS — I471 Supraventricular tachycardia: Secondary | ICD-10-CM | POA: Diagnosis present

## 2019-09-29 DIAGNOSIS — C50912 Malignant neoplasm of unspecified site of left female breast: Secondary | ICD-10-CM | POA: Diagnosis not present

## 2019-09-29 DIAGNOSIS — D689 Coagulation defect, unspecified: Secondary | ICD-10-CM | POA: Diagnosis not present

## 2019-09-29 DIAGNOSIS — B370 Candidal stomatitis: Secondary | ICD-10-CM | POA: Diagnosis present

## 2019-09-29 DIAGNOSIS — R630 Anorexia: Secondary | ICD-10-CM | POA: Diagnosis not present

## 2019-09-29 DIAGNOSIS — C787 Secondary malignant neoplasm of liver and intrahepatic bile duct: Secondary | ICD-10-CM | POA: Diagnosis present

## 2019-09-29 DIAGNOSIS — I82401 Acute embolism and thrombosis of unspecified deep veins of right lower extremity: Secondary | ICD-10-CM | POA: Diagnosis not present

## 2019-09-29 DIAGNOSIS — Z9181 History of falling: Secondary | ICD-10-CM | POA: Diagnosis not present

## 2019-09-29 DIAGNOSIS — I82433 Acute embolism and thrombosis of popliteal vein, bilateral: Secondary | ICD-10-CM | POA: Diagnosis present

## 2019-09-29 DIAGNOSIS — W19XXXA Unspecified fall, initial encounter: Secondary | ICD-10-CM | POA: Diagnosis not present

## 2019-09-29 DIAGNOSIS — B379 Candidiasis, unspecified: Secondary | ICD-10-CM | POA: Diagnosis present

## 2019-09-29 DIAGNOSIS — D72829 Elevated white blood cell count, unspecified: Secondary | ICD-10-CM | POA: Diagnosis not present

## 2019-09-29 DIAGNOSIS — Z7722 Contact with and (suspected) exposure to environmental tobacco smoke (acute) (chronic): Secondary | ICD-10-CM | POA: Diagnosis not present

## 2019-09-29 DIAGNOSIS — Z8249 Family history of ischemic heart disease and other diseases of the circulatory system: Secondary | ICD-10-CM | POA: Diagnosis not present

## 2019-09-29 DIAGNOSIS — Z79899 Other long term (current) drug therapy: Secondary | ICD-10-CM | POA: Diagnosis not present

## 2019-09-29 DIAGNOSIS — K2210 Ulcer of esophagus without bleeding: Secondary | ICD-10-CM | POA: Diagnosis not present

## 2019-09-29 DIAGNOSIS — I427 Cardiomyopathy due to drug and external agent: Secondary | ICD-10-CM | POA: Diagnosis not present

## 2019-09-29 DIAGNOSIS — C7800 Secondary malignant neoplasm of unspecified lung: Secondary | ICD-10-CM | POA: Diagnosis present

## 2019-09-29 DIAGNOSIS — Q828 Other specified congenital malformations of skin: Secondary | ICD-10-CM | POA: Diagnosis not present

## 2019-09-29 DIAGNOSIS — J029 Acute pharyngitis, unspecified: Secondary | ICD-10-CM | POA: Diagnosis not present

## 2019-09-29 DIAGNOSIS — Z5189 Encounter for other specified aftercare: Secondary | ICD-10-CM | POA: Diagnosis not present

## 2019-09-29 DIAGNOSIS — A419 Sepsis, unspecified organism: Secondary | ICD-10-CM | POA: Diagnosis present

## 2019-09-29 DIAGNOSIS — Z7902 Long term (current) use of antithrombotics/antiplatelets: Secondary | ICD-10-CM | POA: Diagnosis not present

## 2019-09-29 DIAGNOSIS — I82411 Acute embolism and thrombosis of right femoral vein: Secondary | ICD-10-CM | POA: Diagnosis present

## 2019-09-29 DIAGNOSIS — B351 Tinea unguium: Secondary | ICD-10-CM | POA: Diagnosis not present

## 2019-09-29 DIAGNOSIS — M79676 Pain in unspecified toe(s): Secondary | ICD-10-CM | POA: Diagnosis not present

## 2019-09-29 DIAGNOSIS — M25571 Pain in right ankle and joints of right foot: Secondary | ICD-10-CM | POA: Diagnosis not present

## 2019-09-29 DIAGNOSIS — I428 Other cardiomyopathies: Secondary | ICD-10-CM | POA: Diagnosis not present

## 2019-09-29 DIAGNOSIS — Z79818 Long term (current) use of other agents affecting estrogen receptors and estrogen levels: Secondary | ICD-10-CM | POA: Diagnosis not present

## 2019-09-29 DIAGNOSIS — Z7901 Long term (current) use of anticoagulants: Secondary | ICD-10-CM | POA: Diagnosis not present

## 2019-09-29 DIAGNOSIS — T380X5A Adverse effect of glucocorticoids and synthetic analogues, initial encounter: Secondary | ICD-10-CM | POA: Diagnosis not present

## 2019-09-29 DIAGNOSIS — R509 Fever, unspecified: Secondary | ICD-10-CM | POA: Diagnosis present

## 2019-09-29 DIAGNOSIS — D702 Other drug-induced agranulocytosis: Secondary | ICD-10-CM | POA: Diagnosis not present

## 2019-09-29 DIAGNOSIS — C50412 Malignant neoplasm of upper-outer quadrant of left female breast: Secondary | ICD-10-CM | POA: Diagnosis not present

## 2019-09-29 DIAGNOSIS — Z833 Family history of diabetes mellitus: Secondary | ICD-10-CM | POA: Diagnosis not present

## 2019-09-29 DIAGNOSIS — K2970 Gastritis, unspecified, without bleeding: Secondary | ICD-10-CM | POA: Diagnosis not present

## 2019-09-29 DIAGNOSIS — Z20822 Contact with and (suspected) exposure to covid-19: Secondary | ICD-10-CM | POA: Diagnosis present

## 2019-09-29 DIAGNOSIS — R29898 Other symptoms and signs involving the musculoskeletal system: Secondary | ICD-10-CM | POA: Diagnosis not present

## 2019-09-29 DIAGNOSIS — D72819 Decreased white blood cell count, unspecified: Secondary | ICD-10-CM | POA: Diagnosis not present

## 2019-09-29 DIAGNOSIS — J302 Other seasonal allergic rhinitis: Secondary | ICD-10-CM | POA: Diagnosis not present

## 2019-09-29 DIAGNOSIS — M2550 Pain in unspecified joint: Secondary | ICD-10-CM | POA: Diagnosis not present

## 2019-09-29 DIAGNOSIS — H5500 Unspecified nystagmus: Secondary | ICD-10-CM | POA: Diagnosis not present

## 2019-09-29 DIAGNOSIS — Z803 Family history of malignant neoplasm of breast: Secondary | ICD-10-CM | POA: Diagnosis not present

## 2019-09-29 DIAGNOSIS — M5489 Other dorsalgia: Secondary | ICD-10-CM | POA: Diagnosis not present

## 2019-09-29 DIAGNOSIS — C7951 Secondary malignant neoplasm of bone: Secondary | ICD-10-CM | POA: Diagnosis present

## 2019-09-29 DIAGNOSIS — R5381 Other malaise: Secondary | ICD-10-CM | POA: Diagnosis not present

## 2019-09-29 DIAGNOSIS — I219 Acute myocardial infarction, unspecified: Secondary | ICD-10-CM | POA: Diagnosis not present

## 2019-09-29 DIAGNOSIS — I2510 Atherosclerotic heart disease of native coronary artery without angina pectoris: Secondary | ICD-10-CM | POA: Diagnosis present

## 2019-09-29 DIAGNOSIS — J432 Centrilobular emphysema: Secondary | ICD-10-CM | POA: Diagnosis not present

## 2019-09-29 DIAGNOSIS — G40909 Epilepsy, unspecified, not intractable, without status epilepticus: Secondary | ICD-10-CM | POA: Diagnosis not present

## 2019-09-29 DIAGNOSIS — Z66 Do not resuscitate: Secondary | ICD-10-CM | POA: Diagnosis present

## 2019-09-29 DIAGNOSIS — Z823 Family history of stroke: Secondary | ICD-10-CM | POA: Diagnosis not present

## 2019-09-29 DIAGNOSIS — E860 Dehydration: Secondary | ICD-10-CM | POA: Diagnosis present

## 2019-09-29 DIAGNOSIS — E1165 Type 2 diabetes mellitus with hyperglycemia: Secondary | ICD-10-CM | POA: Diagnosis not present

## 2019-09-29 DIAGNOSIS — I82403 Acute embolism and thrombosis of unspecified deep veins of lower extremity, bilateral: Secondary | ICD-10-CM | POA: Diagnosis not present

## 2019-09-29 DIAGNOSIS — R2681 Unsteadiness on feet: Secondary | ICD-10-CM | POA: Diagnosis not present

## 2019-09-29 DIAGNOSIS — R52 Pain, unspecified: Secondary | ICD-10-CM | POA: Diagnosis not present

## 2019-09-29 DIAGNOSIS — Z95828 Presence of other vascular implants and grafts: Secondary | ICD-10-CM | POA: Diagnosis not present

## 2019-10-04 ENCOUNTER — Encounter: Payer: Self-pay | Admitting: *Deleted

## 2019-10-04 ENCOUNTER — Emergency Department: Payer: Medicare HMO

## 2019-10-04 ENCOUNTER — Inpatient Hospital Stay
Admission: EM | Admit: 2019-10-04 | Discharge: 2019-10-06 | DRG: 872 | Disposition: A | Discharge status: Home Health | Payer: Medicare HMO | Attending: Internal Medicine | Admitting: Internal Medicine

## 2019-10-04 ENCOUNTER — Other Ambulatory Visit: Payer: Self-pay

## 2019-10-04 DIAGNOSIS — I429 Cardiomyopathy, unspecified: Secondary | ICD-10-CM | POA: Diagnosis present

## 2019-10-04 DIAGNOSIS — Z4682 Encounter for fitting and adjustment of non-vascular catheter: Secondary | ICD-10-CM | POA: Diagnosis not present

## 2019-10-04 DIAGNOSIS — C7931 Secondary malignant neoplasm of brain: Secondary | ICD-10-CM | POA: Diagnosis present

## 2019-10-04 DIAGNOSIS — C7800 Secondary malignant neoplasm of unspecified lung: Secondary | ICD-10-CM | POA: Diagnosis not present

## 2019-10-04 DIAGNOSIS — W07XXXA Fall from chair, initial encounter: Secondary | ICD-10-CM | POA: Diagnosis present

## 2019-10-04 DIAGNOSIS — W19XXXA Unspecified fall, initial encounter: Secondary | ICD-10-CM

## 2019-10-04 DIAGNOSIS — Z803 Family history of malignant neoplasm of breast: Secondary | ICD-10-CM | POA: Diagnosis not present

## 2019-10-04 DIAGNOSIS — Z833 Family history of diabetes mellitus: Secondary | ICD-10-CM

## 2019-10-04 DIAGNOSIS — Z823 Family history of stroke: Secondary | ICD-10-CM | POA: Diagnosis not present

## 2019-10-04 DIAGNOSIS — I10 Essential (primary) hypertension: Secondary | ICD-10-CM | POA: Diagnosis present

## 2019-10-04 DIAGNOSIS — Z20822 Contact with and (suspected) exposure to covid-19: Secondary | ICD-10-CM | POA: Diagnosis not present

## 2019-10-04 DIAGNOSIS — R509 Fever, unspecified: Secondary | ICD-10-CM | POA: Diagnosis not present

## 2019-10-04 DIAGNOSIS — C7951 Secondary malignant neoplasm of bone: Secondary | ICD-10-CM | POA: Diagnosis present

## 2019-10-04 DIAGNOSIS — Z808 Family history of malignant neoplasm of other organs or systems: Secondary | ICD-10-CM

## 2019-10-04 DIAGNOSIS — Z170 Estrogen receptor positive status [ER+]: Secondary | ICD-10-CM | POA: Diagnosis not present

## 2019-10-04 DIAGNOSIS — A419 Sepsis, unspecified organism: Principal | ICD-10-CM | POA: Diagnosis present

## 2019-10-04 DIAGNOSIS — S0990XA Unspecified injury of head, initial encounter: Secondary | ICD-10-CM | POA: Diagnosis not present

## 2019-10-04 DIAGNOSIS — Z8249 Family history of ischemic heart disease and other diseases of the circulatory system: Secondary | ICD-10-CM

## 2019-10-04 DIAGNOSIS — Z86711 Personal history of pulmonary embolism: Secondary | ICD-10-CM | POA: Diagnosis not present

## 2019-10-04 DIAGNOSIS — R000 Tachycardia, unspecified: Secondary | ICD-10-CM

## 2019-10-04 DIAGNOSIS — E785 Hyperlipidemia, unspecified: Secondary | ICD-10-CM | POA: Diagnosis present

## 2019-10-04 DIAGNOSIS — T451X5A Adverse effect of antineoplastic and immunosuppressive drugs, initial encounter: Secondary | ICD-10-CM | POA: Diagnosis present

## 2019-10-04 DIAGNOSIS — S199XXA Unspecified injury of neck, initial encounter: Secondary | ICD-10-CM | POA: Diagnosis not present

## 2019-10-04 DIAGNOSIS — R52 Pain, unspecified: Secondary | ICD-10-CM | POA: Diagnosis not present

## 2019-10-04 DIAGNOSIS — C50919 Malignant neoplasm of unspecified site of unspecified female breast: Secondary | ICD-10-CM | POA: Diagnosis present

## 2019-10-04 DIAGNOSIS — F17210 Nicotine dependence, cigarettes, uncomplicated: Secondary | ICD-10-CM | POA: Diagnosis present

## 2019-10-04 DIAGNOSIS — C787 Secondary malignant neoplasm of liver and intrahepatic bile duct: Secondary | ICD-10-CM | POA: Diagnosis not present

## 2019-10-04 DIAGNOSIS — G620 Drug-induced polyneuropathy: Secondary | ICD-10-CM | POA: Diagnosis present

## 2019-10-04 DIAGNOSIS — M5489 Other dorsalgia: Secondary | ICD-10-CM | POA: Diagnosis not present

## 2019-10-04 MED ORDER — ACETAMINOPHEN 325 MG PO TABS
650.0000 mg | ORAL_TABLET | Freq: Once | ORAL | Status: AC
  Administered 2019-10-05: 650 mg via ORAL
  Filled 2019-10-04: qty 2

## 2019-10-04 MED ORDER — VANCOMYCIN HCL IN DEXTROSE 1-5 GM/200ML-% IV SOLN
1000.0000 mg | Freq: Once | INTRAVENOUS | Status: AC
  Administered 2019-10-05: 1000 mg via INTRAVENOUS
  Filled 2019-10-04: qty 200

## 2019-10-04 MED ORDER — SODIUM CHLORIDE 0.9 % IV SOLN
2.0000 g | Freq: Once | INTRAVENOUS | Status: AC
  Administered 2019-10-05: 2 g via INTRAVENOUS
  Filled 2019-10-04: qty 2

## 2019-10-04 MED ORDER — METRONIDAZOLE IN NACL 5-0.79 MG/ML-% IV SOLN
500.0000 mg | Freq: Once | INTRAVENOUS | Status: AC
  Administered 2019-10-05: 01:00:00 500 mg via INTRAVENOUS
  Filled 2019-10-04: qty 100

## 2019-10-04 MED ORDER — VANCOMYCIN HCL IN DEXTROSE 1-5 GM/200ML-% IV SOLN
1000.0000 mg | Freq: Once | INTRAVENOUS | Status: AC
  Administered 2019-10-05: 1000 mg via INTRAVENOUS
  Filled 2019-10-04 (×2): qty 200

## 2019-10-04 NOTE — ED Provider Notes (Signed)
[facility] Emergency Department Provider Note  ____________________________________________   First MD Initiated Contact with Patient 10/04/19 2318     (approximate)  I have reviewed the triage vital signs and the nursing notes.   HISTORY  Chief Complaint Fall    HPI Sarah Horne is a 64 y.o. female with below list of previous medical conditions including metastatic breast CA currently receiving chemotherapy under the care of Dr. Rogue Bussing presents to the emergency department secondary to accidental fall from her rocking chair today.  Patient states that she also fell yesterday.  Patient patient's family states that she has been weak today.  Patient noted to have a fever on arrival temperature 101.1 with a heart rate of 143.  Sarah Horne admits to generalized pain including neck shoulders arms and legs.  Patient denies any cough no nausea vomiting diarrhea constipation.  Patient denies any abdominal pain.  Patient denies any urinary symptoms.        Past Medical History:  Diagnosis Date  . Chemotherapy-induced peripheral neuropathy ([place])   . Epistaxis    a. 11/2016 in setting of asa/plavix-->silver nitrate cauterization.  . GI bleed    a. 11/2016 Admission w/ GIB and hypovolemic shock req 3u PRBC's;  b. 11/2016 ECG: gastritis & nonbleeding peptic ulcer; c. 11/2016 Conlonoscopy: rectal and sigmoid colonic ulcers.  . Heart attack ([place])    a. 1998 Cath @ [facility]: reportedly no intervention required.  Marland Kitchen Herceptin-induced cardiomyopathy ([place])    a. In the setting of Herceptin Rx for breast cancer (initiated 12/2014); b. 03/2015 MUGA EF 64%; b. 08/2015 MUGA: EF 51%; c. 10/2015 MUGA: EF 44%; d. 11/2015 Echo: EF 45-50%; e. 01/2016 MUGA: EF 60%; f. 06/2016 MUGA EF 65%; g. 10/2016 MUGA: EF 61%;  h. 12/2016 Echo: EF 55-60%, gr1 DD.  Marland Kitchen Hyperlipidemia   . Hypertension   . Neuropathy   . Possible PAD (peripheral artery disease) ([place])    a. 11/2016 LE cyanosis and weak  pulses-->CTA w/o significant Ao-BiFem dzs. ? distal dzs-->ASA/Plavix initiated by vascular surgery.  Marland Kitchen PSVT (paroxysmal supraventricular tachycardia) ([place])    a. Dx 11/2016.  . Pulmonary embolism ([place])    a. 12/2016 CTA Chest: small nonocclusive PE in inferior segment of the Left lingula, somewhat eccentric filling defect suggesting chronic rather than acute embolic event; b. 03/8937 LE U/S:  No DVT; c. 12/2016 Echo: Nl RV fxn, nl PASP.  Marland Kitchen Recurrent Metastatic breast cancer ([place])    a. Dx 2016: Stage II, ER positive, PR positive, HER-2/neu overexpressing of the left breast-->chemo/radiation; b. 10/2016 CT Abd/pelvis: diffuse liver mets, ill defined sclerotic bone lesions-T12;  c. 10/2016 MRI brain: metastatic lesion along L temporal lobe (19x49m) w/ extensive surrounding edema & 567mmidline shift to right.  . Sinus tachycardia     Patient Active Problem List   Diagnosis Date Noted  . Accidental fall from chair, initial encounter 10/05/2019  . Metastatic breast cancer (HCGeorge01/03/2020  . Sepsis (HCCarson01/03/2020  . Tachyarrhythmia 10/05/2019  . Brain metastasis (HCComo08/22/2018  . Blood in stool   . Polyp of sigmoid colon   . Benign neoplasm of transverse colon   . Benign neoplasm of ascending colon   . Lower GI bleed   . Weakness of right lower extremity 02/11/2017  . Orthostatic lightheadedness 01/14/2017  . Atrial tachycardia (HCLonoke04/19/2018  . Hypokalemia 01/14/2017  . Pulmonary emboli (HCAshton04/17/2018  . Dehydration 12/25/2016  . Blood loss anemia 12/15/2016  . Ulceration of colon   .  Heartburn   . Duodenitis   . Gastritis without bleeding   . GI bleed   . Acute esophagitis   . Acute esophagogastric ulcer   . Rectal bleed   . Hypovolemic shock ([place])   . Ischemic leg 12/04/2016  . Chemotherapy induced neutropenia ([place]) 11/20/2016  . Palliative care by specialist   . Goals of care, counseling/discussion   . DNR (do not resuscitate) discussion   . Cerebral edema ([place])  11/10/2016  . Encounter for monitoring cardiotoxic drug therapy 11/09/2016  . Elevated LFTs 10/20/2016  . Peripheral neuropathy due to chemotherapy ([place]) 03/13/2016  . Encounter for antineoplastic chemotherapy 03/13/2016  . HLD (hyperlipidemia) 01/29/2016  . Hyperglycemia, unspecified 01/29/2016  . Cardiomyopathy ([place]) 01/13/2016  . Hypertension 07/18/2015  . Carcinoma of upper-outer quadrant of left breast in female, estrogen receptor positive ([place]) 12/27/2014    Past Surgical History:  Procedure Laterality Date  . BREAST BIOPSY Left 2016   Positive  . BREAST LUMPECTOMY WITH SENTINEL LYMPH NODE BIOPSY Left 05/23/2015   Procedure: LEFT BREAST WIDE EXCISION WITH AXILLARY DISSECTION, MASTOPLASTY ;  Surgeon: [name], MD;  Location: [facility] ORS;  Service: General;  Laterality: Left;  . BREAST SURGERY Left 12/18/14   breast biopsy/INVASIVE DUCTAL CARCINOMA OF BREAST, NOTTINGHAM GRADE 2.  Marland Kitchen BREAST SURGERY  05/23/2015.   Wide excision/mastoplasty, axillary dissection. No residual invasive cancer, positive for residual DCIS. 0/2 nodes identified on axillary dissection. (no SLN by technetium or methylene blue)  . CARDIAC CATHETERIZATION    . COLONOSCOPY WITH PROPOFOL N/A 12/10/2016   Procedure: COLONOSCOPY WITH PROPOFOL;  Surgeon: Lucilla Lame, MD;  Location: [facility] ENDOSCOPY;  Service: Endoscopy;  Laterality: N/A;  . COLONOSCOPY WITH PROPOFOL N/A 02/13/2017   Procedure: COLONOSCOPY WITH PROPOFOL;  Surgeon: Lucilla Lame, MD;  Location: [facility] ENDOSCOPY;  Service: Endoscopy;  Laterality: N/A;  . ESOPHAGOGASTRODUODENOSCOPY (EGD) WITH PROPOFOL N/A 12/08/2016   Procedure: ESOPHAGOGASTRODUODENOSCOPY (EGD) WITH PROPOFOL;  Surgeon: Lucilla Lame, MD;  Location: [facility] ENDOSCOPY;  Service: Endoscopy;  Laterality: N/A;  . IVC FILTER INSERTION N/A 02/15/2017   Procedure: IVC Filter Insertion;  Surgeon: Algernon Huxley, MD;  Location: [place] CV LAB;  Service: Cardiovascular;  Laterality: N/A;  . PORTACATH  PLACEMENT Right 12-31-14   Dr Bary Castilla    Prior to Admission medications   Medication Sig Start Date End Date Taking? Authorizing Provider  dexamethasone (DECADRON) 2 MG tablet Take 1 pill BID [morning and afternoon] 09/08/19  Yes Cammie Sickle, MD  flecainide (TAMBOCOR) 50 MG tablet Take 1 tablet (50 mg total) by mouth 2 (two) times daily. 09/04/19  Yes Theora Gianotti, NP  fluconazole (DIFLUCAN) 100 MG tablet Take 1 tablet (100 mg total) by mouth daily. 09/28/19  Yes Charlaine Dalton R, MD  ipratropium (ATROVENT) 0.03 % nasal spray Place 2 sprays into both nostrils every 12 (twelve) hours. 09/13/19  Yes Cammie Sickle, MD  magnesium oxide (MAG-OX) 400 MG tablet Take 1 tablet (400 mg total) by mouth 2 (two) times daily. 08/09/19  Yes Wellington Hampshire, MD  montelukast (SINGULAIR) 10 MG tablet Take 1 tablet (10 mg total) by mouth at bedtime. 09/08/19  Yes Cammie Sickle, MD  pregabalin (LYRICA) 75 MG capsule Take 1 capsule (75 mg total) by mouth 2 (two) times daily. 06/27/19  Yes Cammie Sickle, MD  Vitamin D, Ergocalciferol, (DRISDOL) 1.25 MG (50000 UT) CAPS capsule TAKE 1 CAPSULE (50,000 UNITS TOTAL) ONCE A WEEK BY MOUTH. 07/05/19  Yes Cammie Sickle, MD  meclizine (ANTIVERT) 25 MG tablet Take 1 tablet (25 mg total) by mouth 3 (three) times daily as needed for dizziness. Patient not taking: Reported on 09/27/2019 08/30/19   Jacquelin Hawking, NP    Allergies No known allergies  Family History  Problem Relation Age of Onset  . Breast cancer Maternal Aunt   . Breast cancer Cousin   . Brain cancer Maternal Uncle   . Diabetes Mother   . Hypertension Mother   . Stroke Mother     Social History Social History   Tobacco Use  . Smoking status: Light Tobacco Smoker    Packs/day: 0.50    Years: 18.00    Pack years: 9.00    Types: Cigarettes  . Smokeless tobacco: Never Used  Substance Use Topics  . Alcohol use: No    Alcohol/week: 0.0  standard drinks  . Drug use: No    Review of Systems Constitutional: Positive for fever/chills Eyes: No visual changes. ENT: No sore throat. Cardiovascular: Denies chest pain. Respiratory: Denies shortness of breath. Gastrointestinal: No abdominal pain.  No nausea, no vomiting.  No diarrhea.  No constipation. Genitourinary: Negative for dysuria. Musculoskeletal: Positive for generalized pain Integumentary: Negative for rash. Neurological: Negative for headaches, focal weakness or numbness.   ____________________________________________   PHYSICAL EXAM:  VITAL SIGNS: ED Triage Vitals  Enc Vitals Group     BP 10/04/19 2215 124/85     Pulse Rate 10/04/19 2215 (!) 146     Resp 10/04/19 2215 16     Temp 10/04/19 2215 (!) 101.1 F (38.4 C)     Temp Source 10/04/19 2215 Oral     SpO2 10/04/19 2215 98 %     Weight 10/04/19 2216 76.2 kg (168 lb)     Height 10/04/19 2216 1.676 m (5' 6" )     Head Circumference --      Peak Flow --      Pain Score 10/04/19 2215 8     Pain Loc --      Pain Edu? --      Excl. in [place]? --     Constitutional: Alert and oriented.  Eyes: Conjunctivae are normal.  Head: Atraumatic. Mouth/Throat: Patient is wearing a mask. Neck: No stridor.  No meningeal signs.   Cardiovascular: Normal rate, regular rhythm. Good peripheral circulation. Grossly normal heart sounds. Respiratory: Normal respiratory effort.  No retractions. Gastrointestinal: Soft and nontender. No distention.  Musculoskeletal: No lower extremity tenderness nor edema. No gross deformities of extremities. Neurologic:  Normal speech and language. No gross focal neurologic deficits are appreciated.  Skin:  Skin is warm, dry and intact. Psychiatric: Mood and affect are normal. Speech and behavior are normal.  ____________________________________________   LABS (all labs ordered are listed, but only abnormal results are displayed)  Labs Reviewed  COMPREHENSIVE METABOLIC PANEL - Abnormal;  Notable for the following components:      Result Value   Calcium 8.5 (*)    Albumin 3.1 (*)    AST 90 (*)    ALT 94 (*)    Alkaline Phosphatase 233 (*)    Total Bilirubin 1.3 (*)    All other components within normal limits  CBC WITH DIFFERENTIAL/PLATELET - Abnormal; Notable for the following components:   RDW 16.4 (*)    Lymphs Abs 0.6 (*)    All other components within normal limits  URINALYSIS, COMPLETE (UACMP) WITH MICROSCOPIC - Abnormal; Notable for the following components:   Color, Urine YELLOW (*)    APPearance  CLEAR (*)    Hgb urine dipstick MODERATE (*)    Ketones, ur 5 (*)    All other components within normal limits  CULTURE, BLOOD (ROUTINE X 2)  CULTURE, BLOOD (ROUTINE X 2)  URINE CULTURE  SARS CORONAVIRUS 2 (TAT 6-24 HRS)  LACTIC ACID, PLASMA  LACTIC ACID, PLASMA  APTT  PROTIME-INR  TSH  HIV ANTIBODY (ROUTINE TESTING W REFLEX)  CORTISOL-AM, BLOOD  PROCALCITONIN  CBC  CREATININE, SERUM  POC SARS CORONAVIRUS 2 AG -  ED  POC SARS CORONAVIRUS 2 AG   ____________________________________________  EKG ED ECG REPORT I, [place] Breslin Hemann, the attending physician, personally viewed and interpreted this ECG.   Date: 10/04/2019  EKG Time: 10:15 PM  Rate: 145  Rhythm: Sinus tachycardia  Axis: Normal  intervals: Normal  ST&T Change: None   ____________________________________________  RADIOLOGY I, [place] Jobe Mutch, personally viewed and evaluated these images (plain radiographs) as part of my medical decision making, as well as reviewing the written report by the radiologist.  ED MD interpretation: CT head and cervical spine revealed severe chronic white matter disease status post radiation no acute findings per radiologist.  Cavitary lesion noted    Official radiology report(s): CT Head Wo Contrast  Result Date: 10/05/2019 CLINICAL DATA:  Fall EXAM: CT HEAD WITHOUT CONTRAST CT CERVICAL SPINE WITHOUT CONTRAST TECHNIQUE: Multidetector CT imaging of the  head and cervical spine was performed following the standard protocol without intravenous contrast. Multiplanar CT image reconstructions of the cervical spine were also generated. COMPARISON:  None. FINDINGS: CT HEAD FINDINGS Brain: There is no mass, hemorrhage or extra-axial collection. The size and configuration of the ventricles and extra-axial CSF spaces are normal. There is diffuse severe white matter hypoattenuation, possibly a sequela of radiation therapy. Vascular: No abnormal hyperdensity of the major intracranial arteries or dural venous sinuses. No intracranial atherosclerosis. Skull: The visualized skull base, calvarium and extracranial soft tissues are normal. Sinuses/Orbits: No fluid levels or advanced mucosal thickening of the visualized paranasal sinuses. No mastoid or middle ear effusion. The orbits are normal. CT CERVICAL SPINE FINDINGS Alignment: No static subluxation. Facets are aligned. Occipital condyles are normally positioned. Skull base and vertebrae: No acute fracture. Soft tissues and spinal canal: No prevertebral fluid or swelling. No visible canal hematoma. Disc levels: Multilevel facet arthrosis. There is fusion of the right C2 and C3 facets. Upper chest: Large cavitary lesion of the left lung apex, measuring up to 3.5 cm. Other: Normal visualized paraspinal cervical soft tissues. IMPRESSION: 1. Severe chronic white matter disease, likely post radiation changes. No acute intracranial abnormality. 2. No acute fracture or static subluxation of the cervical spine. 3. Large cavitary lesion of the left lung apex, measuring up to 3.5 cm, likely a metastasis. Electronically Signed   By: Ulyses Jarred M.D.   On: 10/05/2019 01:27   CT Cervical Spine Wo Contrast  Result Date: 10/05/2019 CLINICAL DATA:  Fall EXAM: CT HEAD WITHOUT CONTRAST CT CERVICAL SPINE WITHOUT CONTRAST TECHNIQUE: Multidetector CT imaging of the head and cervical spine was performed following the standard protocol without  intravenous contrast. Multiplanar CT image reconstructions of the cervical spine were also generated. COMPARISON:  None. FINDINGS: CT HEAD FINDINGS Brain: There is no mass, hemorrhage or extra-axial collection. The size and configuration of the ventricles and extra-axial CSF spaces are normal. There is diffuse severe white matter hypoattenuation, possibly a sequela of radiation therapy. Vascular: No abnormal hyperdensity of the major intracranial arteries or dural venous sinuses. No intracranial atherosclerosis. Skull:  The visualized skull base, calvarium and extracranial soft tissues are normal. Sinuses/Orbits: No fluid levels or advanced mucosal thickening of the visualized paranasal sinuses. No mastoid or middle ear effusion. The orbits are normal. CT CERVICAL SPINE FINDINGS Alignment: No static subluxation. Facets are aligned. Occipital condyles are normally positioned. Skull base and vertebrae: No acute fracture. Soft tissues and spinal canal: No prevertebral fluid or swelling. No visible canal hematoma. Disc levels: Multilevel facet arthrosis. There is fusion of the right C2 and C3 facets. Upper chest: Large cavitary lesion of the left lung apex, measuring up to 3.5 cm. Other: Normal visualized paraspinal cervical soft tissues. IMPRESSION: 1. Severe chronic white matter disease, likely post radiation changes. No acute intracranial abnormality. 2. No acute fracture or static subluxation of the cervical spine. 3. Large cavitary lesion of the left lung apex, measuring up to 3.5 cm, likely a metastasis. Electronically Signed   By: Ulyses Jarred M.D.   On: 10/05/2019 01:27   DG Chest Port 1 View  Result Date: 10/04/2019 CLINICAL DATA:  Fevers. EXAM: PORTABLE CHEST 1 VIEW COMPARISON:  06/04/2017, 06/20/2019 PET-CT FINDINGS: Cardiac shadows within normal limits. Right chest wall port is again seen and stable. Changes of prior left mastectomy are seen. No acute bony abnormality is seen. No focal infiltrate is  noted. IMPRESSION: No acute abnormality noted. Electronically Signed   By: Inez Catalina M.D.   On: 10/04/2019 23:41    ____________________________________________   PROCEDURES     .Critical Care Performed by: Gregor Hams, MD Authorized by: Gregor Hams, MD   Critical care provider statement:    Critical care time (minutes):  30   Critical care time was exclusive of:  Separately billable procedures and treating other patients   Critical care was necessary to treat or prevent imminent or life-threatening deterioration of the following conditions:  Sepsis   Critical care was time spent personally by me on the following activities:  Development of treatment plan with patient or surrogate, discussions with consultants, evaluation of patient's response to treatment, examination of patient, obtaining history from patient or surrogate, ordering and performing treatments and interventions, ordering and review of laboratory studies, ordering and review of radiographic studies, pulse oximetry, re-evaluation of patient's condition and review of old charts     ____________________________________________   INITIAL IMPRESSION / MDM / [place] / ED COURSE  As part of my medical decision making, I reviewed the following data within the electronic MEDICAL RECORD NUMBER   64 year old female presenting with above-stated history and physical exam with differential diagnosis including sepsis as the patient meets criteria.  As such sepsis protocol was initiated.  Patient received appropriate IV antibiotic therapy vancomycin cefepime and Flagyl.  Patient COVID-19 negative no clear etiology for the patient's sepsis yet identified as urinalysis and additional laboratory data revealed no source.  Blood cultures pending.  Patient discussed with Dr. Damita Dunnings hospitalist for further evaluation and management  ____________________________________________  FINAL CLINICAL IMPRESSION(S) / ED  DIAGNOSES  Final diagnoses:  Sepsis, due to unspecified organism, unspecified whether acute organ dysfunction present [facility])     MEDICATIONS GIVEN DURING THIS VISIT:  Medications  vancomycin (VANCOCIN) IVPB 1000 mg/200 mL premix (1,000 mg Intravenous New Bag/Given 10/05/19 0404)  enoxaparin (LOVENOX) injection 40 mg (has no administration in time range)  0.9 %  sodium chloride infusion ( Intravenous New Bag/Given 10/05/19 0404)  cefTRIAXone (ROCEPHIN) 2 g in sodium chloride 0.9 % 100 mL IVPB (has no administration in time range)  azithromycin (ZITHROMAX) 500 mg in sodium chloride 0.9 % 250 mL IVPB (has no administration in time range)  acetaminophen (TYLENOL) tablet 650 mg (has no administration in time range)    Or  acetaminophen (TYLENOL) suppository 650 mg (has no administration in time range)  senna-docusate (Senokot-S) tablet 1 tablet (has no administration in time range)  ondansetron (ZOFRAN) tablet 4 mg (has no administration in time range)    Or  ondansetron (ZOFRAN) injection 4 mg (has no administration in time range)  ceFEPIme (MAXIPIME) 2 g in sodium chloride 0.9 % 100 mL IVPB (0 g Intravenous Stopped 10/05/19 0044)  metroNIDAZOLE (FLAGYL) IVPB 500 mg (0 mg Intravenous Stopped 10/05/19 0219)  vancomycin (VANCOCIN) IVPB 1000 mg/200 mL premix (0 mg Intravenous Stopped 10/05/19 0349)  acetaminophen (TYLENOL) tablet 650 mg (650 mg Oral Given 10/05/19 0315)     ED Discharge Orders    None      *Please note:  [place] was evaluated in Emergency Department on 10/05/2019 for the symptoms described in the history of present illness. She was evaluated in the context of the global COVID-19 pandemic, which necessitated consideration that the patient might be at risk for infection with the SARS-CoV-2 virus that causes COVID-19. Institutional protocols and algorithms that pertain to the evaluation of patients at risk for COVID-19 are in a state of rapid change based on information released by  regulatory bodies including the CDC and federal and state organizations. These policies and algorithms were followed during the patient's care in the ED.  Some ED evaluations and interventions may be delayed as a result of limited staffing during the pandemic.*  Note:  This document was prepared using Dragon voice recognition software and may include unintentional dictation errors.   Gregor Hams, MD 10/05/19 [phone]

## 2019-10-04 NOTE — Progress Notes (Signed)
PHARMACY -  BRIEF ANTIBIOTIC NOTE   Pharmacy has received consult(s) for Cefepime and Vancomycin from an ED provider.  The patient's profile has been reviewed for ht/wt/allergies/indication/available labs.    One time order(s) placed for Cefepime 2gm and Vancomycin 2gm (1gm + 1gm)  Further antibiotics/pharmacy consults should be ordered by admitting physician if indicated.                       Thank you, Hart Robinsons A 10/04/2019  11:27 PM

## 2019-10-04 NOTE — ED Triage Notes (Signed)
Pt to ED reporting she had two falls today after "getting tripped up" Pt denies weakness, lightheadedness or dizziness. RN reports she called the ambulance because she is sore after the fall.   Upon assessment of vitals pts HR is 143 and temp is 101.1. Pt denies knowledge of fever but continues to deny weakness or any symptoms other than generalized soreness with worsening pain in both shoulders and neck. Pt is hunched over in wheelchair and has eyes closed in triage but able to answer questions.   Pt has brain and liver cancer with last treatment a month ago. Next chemo treatment was scheduled for tomorrow per pt.

## 2019-10-04 NOTE — ED Notes (Signed)
Attempted IV without success. Pt requesting to have her port accessed.

## 2019-10-04 NOTE — Progress Notes (Signed)
CODE SEPSIS - PHARMACY COMMUNICATION  **Broad Spectrum Antibiotics should be administered within 1 hour of Sepsis diagnosis**  Time Code Sepsis Called/Page Received: 2319  Antibiotics Ordered: Vancomycin, Cefepime, Flagyl  Time of 1st antibiotic administration: 0014  Additional action taken by pharmacy: n/a   If necessary, Name of Provider/Nurse Contacted: n/a   [name] ,PharmD Clinical Pharmacist  10/04/2019  11:25 PM

## 2019-10-05 ENCOUNTER — Inpatient Hospital Stay: Payer: Medicare HMO

## 2019-10-05 ENCOUNTER — Ambulatory Visit: Payer: Medicare HMO | Admitting: Radiation Oncology

## 2019-10-05 ENCOUNTER — Inpatient Hospital Stay: Payer: Medicare HMO | Admitting: Internal Medicine

## 2019-10-05 ENCOUNTER — Encounter: Payer: Self-pay | Admitting: Internal Medicine

## 2019-10-05 ENCOUNTER — Emergency Department: Payer: Medicare HMO

## 2019-10-05 DIAGNOSIS — Z20822 Contact with and (suspected) exposure to covid-19: Secondary | ICD-10-CM | POA: Diagnosis present

## 2019-10-05 DIAGNOSIS — C50919 Malignant neoplasm of unspecified site of unspecified female breast: Secondary | ICD-10-CM

## 2019-10-05 DIAGNOSIS — G620 Drug-induced polyneuropathy: Secondary | ICD-10-CM | POA: Diagnosis present

## 2019-10-05 DIAGNOSIS — I10 Essential (primary) hypertension: Secondary | ICD-10-CM | POA: Diagnosis present

## 2019-10-05 DIAGNOSIS — C7951 Secondary malignant neoplasm of bone: Secondary | ICD-10-CM | POA: Diagnosis present

## 2019-10-05 DIAGNOSIS — C787 Secondary malignant neoplasm of liver and intrahepatic bile duct: Secondary | ICD-10-CM | POA: Diagnosis present

## 2019-10-05 DIAGNOSIS — W07XXXA Fall from chair, initial encounter: Secondary | ICD-10-CM

## 2019-10-05 DIAGNOSIS — I429 Cardiomyopathy, unspecified: Secondary | ICD-10-CM | POA: Diagnosis present

## 2019-10-05 DIAGNOSIS — T451X5A Adverse effect of antineoplastic and immunosuppressive drugs, initial encounter: Secondary | ICD-10-CM | POA: Diagnosis present

## 2019-10-05 DIAGNOSIS — C7931 Secondary malignant neoplasm of brain: Secondary | ICD-10-CM | POA: Diagnosis present

## 2019-10-05 DIAGNOSIS — Z833 Family history of diabetes mellitus: Secondary | ICD-10-CM | POA: Diagnosis not present

## 2019-10-05 DIAGNOSIS — Z86711 Personal history of pulmonary embolism: Secondary | ICD-10-CM | POA: Diagnosis not present

## 2019-10-05 DIAGNOSIS — Z170 Estrogen receptor positive status [ER+]: Secondary | ICD-10-CM | POA: Diagnosis not present

## 2019-10-05 DIAGNOSIS — E785 Hyperlipidemia, unspecified: Secondary | ICD-10-CM | POA: Diagnosis present

## 2019-10-05 DIAGNOSIS — Z808 Family history of malignant neoplasm of other organs or systems: Secondary | ICD-10-CM | POA: Diagnosis not present

## 2019-10-05 DIAGNOSIS — R509 Fever, unspecified: Secondary | ICD-10-CM | POA: Diagnosis present

## 2019-10-05 DIAGNOSIS — C7800 Secondary malignant neoplasm of unspecified lung: Secondary | ICD-10-CM | POA: Diagnosis present

## 2019-10-05 DIAGNOSIS — F17210 Nicotine dependence, cigarettes, uncomplicated: Secondary | ICD-10-CM | POA: Diagnosis present

## 2019-10-05 DIAGNOSIS — A419 Sepsis, unspecified organism: Secondary | ICD-10-CM

## 2019-10-05 DIAGNOSIS — R000 Tachycardia, unspecified: Secondary | ICD-10-CM

## 2019-10-05 DIAGNOSIS — Z823 Family history of stroke: Secondary | ICD-10-CM | POA: Diagnosis not present

## 2019-10-05 DIAGNOSIS — Z803 Family history of malignant neoplasm of breast: Secondary | ICD-10-CM | POA: Diagnosis not present

## 2019-10-05 DIAGNOSIS — Z8249 Family history of ischemic heart disease and other diseases of the circulatory system: Secondary | ICD-10-CM | POA: Diagnosis not present

## 2019-10-05 LAB — COMPREHENSIVE METABOLIC PANEL
ALT: 94 U/L — ABNORMAL HIGH (ref 0–44)
AST: 90 U/L — ABNORMAL HIGH (ref 15–41)
Albumin: 3.1 g/dL — ABNORMAL LOW (ref 3.5–5.0)
Alkaline Phosphatase: 233 U/L — ABNORMAL HIGH (ref 38–126)
Anion gap: 14 (ref 5–15)
BUN: 14 mg/dL (ref 8–23)
CO2: 23 mmol/L (ref 22–32)
Calcium: 8.5 mg/dL — ABNORMAL LOW (ref 8.9–10.3)
Chloride: 98 mmol/L (ref 98–111)
Creatinine, Ser: 0.9 mg/dL (ref 0.44–1.00)
GFR calc Af Amer: >60 mL/min (ref >60)
GFR calc non Af Amer: >60 mL/min (ref >60)
Glucose, Bld: 76 mg/dL (ref 70–99)
Potassium: 3.8 mmol/L (ref 3.5–5.1)
Sodium: 135 mmol/L (ref 135–145)
Total Bilirubin: 1.3 mg/dL — ABNORMAL HIGH (ref 0.3–1.2)
Total Protein: 7 g/dL (ref 6.5–8.1)

## 2019-10-05 LAB — CBC WITH DIFFERENTIAL/PLATELET
Abs Immature Granulocytes: 0.03 10*3/uL (ref 0.00–0.07)
Basophils Absolute: 0 10*3/uL (ref 0.0–0.1)
Basophils Relative: 0 %
Eosinophils Absolute: 0 10*3/uL (ref 0.0–0.5)
Eosinophils Relative: 1 %
HCT: 40.4 % (ref 36.0–46.0)
Hemoglobin: 13.3 g/dL (ref 12.0–15.0)
Immature Granulocytes: 1 %
Lymphocytes Relative: 11 %
Lymphs Abs: 0.6 10*3/uL — ABNORMAL LOW (ref 0.7–4.0)
MCH: 30.2 pg (ref 26.0–34.0)
MCHC: 32.9 g/dL (ref 30.0–36.0)
MCV: 91.8 fL (ref 80.0–100.0)
Monocytes Absolute: 0.8 10*3/uL (ref 0.1–1.0)
Monocytes Relative: 14 %
Neutro Abs: 4.1 10*3/uL (ref 1.7–7.7)
Neutrophils Relative %: 73 %
Platelets: 234 10*3/uL (ref 150–400)
RBC: 4.4 MIL/uL (ref 3.87–5.11)
RDW: 16.4 % — ABNORMAL HIGH (ref 11.5–15.5)
WBC: 5.6 10*3/uL (ref 4.0–10.5)
nRBC: 0 % (ref 0.0–0.2)

## 2019-10-05 LAB — LACTIC ACID, PLASMA
Lactic Acid, Venous: 0.8 mmol/L (ref 0.5–1.9)
Lactic Acid, Venous: 0.9 mmol/L (ref 0.5–1.9)

## 2019-10-05 LAB — CBC
HCT: 37.3 % (ref 36.0–46.0)
Hemoglobin: 12.2 g/dL (ref 12.0–15.0)
MCH: 30.3 pg (ref 26.0–34.0)
MCHC: 32.7 g/dL (ref 30.0–36.0)
MCV: 92.8 fL (ref 80.0–100.0)
Platelets: 187 10*3/uL (ref 150–400)
RBC: 4.02 MIL/uL (ref 3.87–5.11)
RDW: 16.6 % — ABNORMAL HIGH (ref 11.5–15.5)
WBC: 4.7 10*3/uL (ref 4.0–10.5)
nRBC: 0 % (ref 0.0–0.2)

## 2019-10-05 LAB — URINALYSIS, COMPLETE (UACMP) WITH MICROSCOPIC
Bacteria, UA: NONE SEEN
Bilirubin Urine: NEGATIVE
Glucose, UA: NEGATIVE mg/dL
Ketones, ur: 5 mg/dL — AB
Leukocytes,Ua: NEGATIVE
Nitrite: NEGATIVE
Protein, ur: NEGATIVE mg/dL
Specific Gravity, Urine: 1.012 (ref 1.005–1.030)
WBC, UA: NONE SEEN WBC/hpf (ref 0–5)
pH: 6 (ref 5.0–8.0)

## 2019-10-05 LAB — HIV ANTIBODY (ROUTINE TESTING W REFLEX): HIV Screen 4th Generation wRfx: NONREACTIVE

## 2019-10-05 LAB — APTT: aPTT: 31 seconds (ref 24–36)

## 2019-10-05 LAB — SARS CORONAVIRUS 2 (TAT 6-24 HRS): SARS Coronavirus 2: NEGATIVE

## 2019-10-05 LAB — PROTIME-INR
INR: 1.1 (ref 0.8–1.2)
Prothrombin Time: 13.7 seconds (ref 11.4–15.2)

## 2019-10-05 LAB — CK: Total CK: 511 U/L — ABNORMAL HIGH (ref 38–234)

## 2019-10-05 LAB — CREATININE, SERUM
Creatinine, Ser: 0.77 mg/dL (ref 0.44–1.00)
GFR calc Af Amer: >60 mL/min (ref >60)
GFR calc non Af Amer: >60 mL/min (ref >60)

## 2019-10-05 LAB — POC SARS CORONAVIRUS 2 AG: SARS Coronavirus 2 Ag: NEGATIVE

## 2019-10-05 LAB — TSH: TSH: 0.545 u[IU]/mL (ref 0.350–4.500)

## 2019-10-05 LAB — CORTISOL-AM, BLOOD: Cortisol - AM: 9.6 ug/dL (ref 6.7–22.6)

## 2019-10-05 LAB — PROCALCITONIN: Procalcitonin: 0.28 ng/mL

## 2019-10-05 MED ORDER — ACETAMINOPHEN 325 MG PO TABS
650.0000 mg | ORAL_TABLET | Freq: Four times a day (QID) | ORAL | Status: DC | PRN
  Administered 2019-10-05 – 2019-10-06 (×2): 650 mg via ORAL
  Filled 2019-10-05 (×2): qty 2

## 2019-10-05 MED ORDER — DEXAMETHASONE 4 MG PO TABS
2.0000 mg | ORAL_TABLET | Freq: Two times a day (BID) | ORAL | Status: DC
  Administered 2019-10-05: 2 mg via ORAL
  Filled 2019-10-05 (×3): qty 0.5

## 2019-10-05 MED ORDER — SODIUM CHLORIDE 0.9 % IV SOLN: INTRAVENOUS | Status: DC

## 2019-10-05 MED ORDER — SENNOSIDES-DOCUSATE SODIUM 8.6-50 MG PO TABS: 1.0000 | ORAL_TABLET | Freq: Every evening | ORAL | Status: DC | PRN

## 2019-10-05 MED ORDER — IBUPROFEN 400 MG PO TABS
400.0000 mg | ORAL_TABLET | Freq: Three times a day (TID) | ORAL | Status: DC
  Administered 2019-10-05 – 2019-10-06 (×3): 400 mg via ORAL
  Filled 2019-10-05 (×3): qty 1

## 2019-10-05 MED ORDER — SODIUM CHLORIDE 0.9 % IV SOLN
500.0000 mg | INTRAVENOUS | Status: DC
  Administered 2019-10-05: 500 mg via INTRAVENOUS
  Filled 2019-10-05: qty 500

## 2019-10-05 MED ORDER — IOHEXOL 350 MG/ML SOLN
75.0000 mL | Freq: Once | INTRAVENOUS | Status: AC | PRN
  Administered 2019-10-05: 75 mL via INTRAVENOUS

## 2019-10-05 MED ORDER — PREGABALIN 75 MG PO CAPS
75.0000 mg | ORAL_CAPSULE | Freq: Two times a day (BID) | ORAL | Status: DC
  Administered 2019-10-05 – 2019-10-06 (×2): 75 mg via ORAL
  Filled 2019-10-05 (×2): qty 1

## 2019-10-05 MED ORDER — FLECAINIDE ACETATE 50 MG PO TABS
50.0000 mg | ORAL_TABLET | Freq: Two times a day (BID) | ORAL | Status: DC
  Administered 2019-10-05 – 2019-10-06 (×2): 50 mg via ORAL
  Filled 2019-10-05 (×3): qty 1

## 2019-10-05 MED ORDER — SODIUM CHLORIDE 0.9 % IV SOLN
2.0000 g | INTRAVENOUS | Status: DC
  Administered 2019-10-05: 2 g via INTRAVENOUS
  Filled 2019-10-05: qty 20

## 2019-10-05 MED ORDER — ONDANSETRON HCL 4 MG PO TABS: 4.0000 mg | ORAL_TABLET | Freq: Four times a day (QID) | ORAL | Status: DC | PRN

## 2019-10-05 MED ORDER — ONDANSETRON HCL 4 MG/2ML IJ SOLN: 4.0000 mg | Freq: Four times a day (QID) | INTRAMUSCULAR | Status: DC | PRN

## 2019-10-05 MED ORDER — ACETAMINOPHEN 650 MG RE SUPP: 650.0000 mg | Freq: Four times a day (QID) | RECTAL | Status: DC | PRN

## 2019-10-05 MED ORDER — CHLORHEXIDINE GLUCONATE CLOTH 2 % EX PADS
6.0000 | MEDICATED_PAD | Freq: Every day | CUTANEOUS | Status: DC
  Administered 2019-10-06: 6 via TOPICAL

## 2019-10-05 MED ORDER — ENOXAPARIN SODIUM 40 MG/0.4ML ~~LOC~~ SOLN
40.0000 mg | SUBCUTANEOUS | Status: DC
  Administered 2019-10-05 – 2019-10-06 (×2): 40 mg via SUBCUTANEOUS
  Filled 2019-10-05 (×2): qty 0.4

## 2019-10-05 NOTE — ED Notes (Signed)
Patient appears to be sleeping in NAD. Meal tray placed at bedside. Call light within reach. Patient remains on CM.

## 2019-10-05 NOTE — ED Notes (Signed)
PIV placed by IV team. Patient off unit briefly for CTA.   Patient medication for 8/10 bilateral elbow pain s/p fall. Warm blankets also provided per request.   Patient updated on POC: waiting on CTA results and IP bed assignment.

## 2019-10-05 NOTE — H&P (Signed)
History and Physical    KEYONI LAPINSKI PNT:[id number] DOB: 08/13/1956 DOA: 10/04/2019  PCP: Arnetha Courser, MD   Patient coming from: home  I have personally briefly reviewed patient's old medical records in [place]  Chief Complaint: soreness following a fall  HPI: MINETTE MANDERS is a 64 y.o. female with medical history significant for history of breast cancer metastatic to the brain and liver, on chemotherapy, history of GI bleed, history of PE, as well as history of SVT on flecainide who presented to the emergency room with soreness after sustaining a fall from her rocking chair twice today. Says that she felt like her feet were 'moving to fast" for her .  She had no preceding chest pain, shortness of breath, palpitations, headache visual disturbance or lightheadedness.  She denies abdominal pain nausea vomiting or change in bowel habits. She denies chest pain, shortness of breath, cough fever or chills.   ED Course: On arrival in the emergency room she was found to be febrile with a temperature of 101.1, blood pressure 124/85, heart rate up to 146.  Respirations were 16 with O2 sat of 98% on room air.  White cell count was normal at 5.6 and hemoglobin 13.  Chemistries were mostly within normal limits with creatinine of 0.9.  AST/ALT elevated at 90/94 with mildly elevated bilirubin of 1.3 and alk phos of 233.  Lactic acid was normal at 0.8.  CT C-spine and CT head were nonacute but did show a large cavitary lesion in the left lung apex measuring up to 3.5 cm likely a metastasis.  Chest x-ray showed no abnormal findings.  Patient was started on IV vancomycin and cefepime for possible sepsis of unknown origin, suspect pneumonia, uriinalysis pending at time of request for admission  Review of Systems: As per HPI otherwise 10 point review of systems negative.    Past Medical History:  Diagnosis Date  . Chemotherapy-induced peripheral neuropathy ([place])   . Epistaxis    a. 11/2016 in  setting of asa/plavix-->silver nitrate cauterization.  . GI bleed    a. 11/2016 Admission w/ GIB and hypovolemic shock req 3u PRBC's;  b. 11/2016 ECG: gastritis & nonbleeding peptic ulcer; c. 11/2016 Conlonoscopy: rectal and sigmoid colonic ulcers.  . Heart attack ([place])    a. 1998 Cath @ [facility]: reportedly no intervention required.  Marland Kitchen Herceptin-induced cardiomyopathy ([place])    a. In the setting of Herceptin Rx for breast cancer (initiated 12/2014); b. 03/2015 MUGA EF 64%; b. 08/2015 MUGA: EF 51%; c. 10/2015 MUGA: EF 44%; d. 11/2015 Echo: EF 45-50%; e. 01/2016 MUGA: EF 60%; f. 06/2016 MUGA EF 65%; g. 10/2016 MUGA: EF 61%;  h. 12/2016 Echo: EF 55-60%, gr1 DD.  Marland Kitchen Hyperlipidemia   . Hypertension   . Neuropathy   . Possible PAD (peripheral artery disease) ([place])    a. 11/2016 LE cyanosis and weak pulses-->CTA w/o significant Ao-BiFem dzs. ? distal dzs-->ASA/Plavix initiated by vascular surgery.  Marland Kitchen PSVT (paroxysmal supraventricular tachycardia) ([place])    a. Dx 11/2016.  . Pulmonary embolism ([place])    a. 12/2016 CTA Chest: small nonocclusive PE in inferior segment of the Left lingula, somewhat eccentric filling defect suggesting chronic rather than acute embolic event; b. 0/8676 LE U/S:  No DVT; c. 12/2016 Echo: Nl RV fxn, nl PASP.  Marland Kitchen Recurrent Metastatic breast cancer ([place])    a. Dx 2016: Stage II, ER positive, PR positive, HER-2/neu overexpressing of the left breast-->chemo/radiation; b. 10/2016 CT Abd/pelvis: diffuse liver mets,  ill defined sclerotic bone lesions-T12;  c. 10/2016 MRI brain: metastatic lesion along L temporal lobe (19x87m) w/ extensive surrounding edema & 58mmidline shift to right.  . Sinus tachycardia     Past Surgical History:  Procedure Laterality Date  . BREAST BIOPSY Left 2016   Positive  . BREAST LUMPECTOMY WITH SENTINEL LYMPH NODE BIOPSY Left 05/23/2015   Procedure: LEFT BREAST WIDE EXCISION WITH AXILLARY DISSECTION, MASTOPLASTY ;  Surgeon: JeRobert BellowMD;  Location: [facility] ORS;  Service:  General;  Laterality: Left;  . BREAST SURGERY Left 12/18/14   breast biopsy/INVASIVE DUCTAL CARCINOMA OF BREAST, NOTTINGHAM GRADE 2.  . Marland KitchenREAST SURGERY  05/23/2015.   Wide excision/mastoplasty, axillary dissection. No residual invasive cancer, positive for residual DCIS. 0/2 nodes identified on axillary dissection. (no SLN by technetium or methylene blue)  . CARDIAC CATHETERIZATION    . COLONOSCOPY WITH PROPOFOL N/A 12/10/2016   Procedure: COLONOSCOPY WITH PROPOFOL;  Surgeon: DaLucilla LameMD;  Location: [facility] ENDOSCOPY;  Service: Endoscopy;  Laterality: N/A;  . COLONOSCOPY WITH PROPOFOL N/A 02/13/2017   Procedure: COLONOSCOPY WITH PROPOFOL;  Surgeon: WoLucilla LameMD;  [facility];  Service: Endoscopy;  Laterality: N/A;  . ESOPHAGOGASTRODUODENOSCOPY (EGD) WITH PROPOFOL N/A 12/08/2016   Procedure: ESOPHAGOGASTRODUODENOSCOPY (EGD) WITH PROPOFOL;  Surgeon: DaLucilla LameMD;  Location: [facility] END[facility]COPY;  Service: Endoscopy;  Laterality: N/A;  . IVC FILTER INSERTION N/A 02/15/2017   Procedure: IVC Filter Insertion;  Surgeon: DeAlgernon HuxleyMD;  [facility];  Service: Cardiovascular;  Laterality: N/A;  . PORTACATH PLACEMENT Right 12-31-14   Dr ByBary Castilla   reports that she has been smoking cigarettes. She has a 9.00 pack-year smoking history. She has never used smokeless tobacco. She reports that she does not drink alcohol or use drugs.  Allergies  Allergen Reactions  . No Known Allergies     Family History  Problem Relation Age of Onset  . Breast cancer Maternal Aunt   . Breast cancer Cousin   . Brain cancer Maternal Uncle   . Diabetes Mother   . Hypertension Mother   . Stroke Mother      Prior to Admission medications   Medication Sig Start Date End Date Taking? Authorizing Provider  dexamethasone (DECADRON) 2 MG tablet Take 1 pill BID [morning and afternoon] 09/08/19  Yes BrCammie SickleMD  flecainide (TAMBOCOR) 50 MG tablet Take 1 tablet (50 mg total) by  mouth 2 (two) times daily. 09/04/19  Yes BeTheora GianottiNP  fluconazole (DIFLUCAN) 100 MG tablet Take 1 tablet (100 mg total) by mouth daily. 09/28/19  Yes BrCharlaine Dalton, MD  ipratropium (ATROVENT) 0.03 % nasal spray Place 2 sprays into both nostrils every 12 (twelve) hours. 09/13/19  Yes BrCammie SickleMD  magnesium oxide (MAG-OX) 400 MG tablet Take 1 tablet (400 mg total) by mouth 2 (two) times daily. 08/09/19  Yes ArWellington HampshireMD  montelukast (SINGULAIR) 10 MG tablet Take 1 tablet (10 mg total) by mouth at bedtime. 09/08/19  Yes BrCammie SickleMD  pregabalin (LYRICA) 75 MG capsule Take 1 capsule (75 mg total) by mouth 2 (two) times daily. 06/27/19  Yes BrCammie SickleMD  Vitamin D, Ergocalciferol, (DRISDOL) 1.25 MG (50000 UT) CAPS capsule TAKE 1 CAPSULE (50,000 UNITS TOTAL) ONCE A WEEK BY MOUTH. 07/05/19  Yes BrCammie SickleMD  meclizine (ANTIVERT) 25 MG tablet Take 1 tablet (25 mg total) by mouth 3 (three) times daily as needed  for dizziness. Patient not taking: Reported on 09/27/2019 08/30/19   Jacquelin Hawking, NP    Physical Exam: Vitals:   10/04/19 2215 10/04/19 2216 10/05/19 0130 10/05/19 0200  BP: 124/85  134/89 120/83  Pulse: (!) 146  (!) 125 (!) 123  Resp: 16  16 15   Temp: (!) 101.1 F (38.4 C)     TempSrc: Oral     SpO2: 98%  99% 97%  Weight:  76.2 kg    Height:  5' 6"  (1.676 m)       Vitals:   10/04/19 2215 10/04/19 2216 10/05/19 0130 10/05/19 0200  BP: 124/85  134/89 120/83  Pulse: (!) 146  (!) 125 (!) 123  Resp: 16  16 15   Temp: (!) 101.1 F (38.4 C)     TempSrc: Oral     SpO2: 98%  99% 97%  Weight:  76.2 kg    Height:  5' 6"  (1.676 m)      Constitutional: NAD, alert and oriented x 3 Eyes: PERRL, lids and conjunctivae normal ENMT: Mucous membranes are moist.  Neck: normal, supple, no masses, no thyromegaly Respiratory: clear to auscultation bilaterally, no wheezing, no crackles. Normal respiratory  effort. No accessory muscle use.  Cardiovascular: tachycardic, no murmurs / rubs / gallops. No extremity edema. 2+ pedal pulses. No carotid bruits.  Abdomen: no tenderness, no masses palpated. No hepatosplenomegaly. Bowel sounds positive.  Musculoskeletal: no clubbing / cyanosis. No joint deformity upper and lower extremities.  Skin: no rashes, lesions, ulcers.  Neurologic: No gross focal neurologic deficit. Psychiatric: Normal mood and affect.   Labs on Admission: I have personally reviewed following labs and imaging studies  CBC: Recent Labs  Lab 09/28/19 1355 10/05/19 0017  WBC 6.3 5.6  NEUTROABS 5.3 4.1  HGB 12.9 13.3  HCT 40.2 40.4  MCV 95.3 91.8  PLT 210 474   Basic Metabolic Panel: Recent Labs  Lab 09/28/19 1355 10/05/19 0017  NA 137 135  K 3.6 3.8  CL 103 98  CO2 24 23  GLUCOSE 196* 76  BUN 17 14  CREATININE 0.83 0.90  CALCIUM 8.4* 8.5*   GFR: Estimated Creatinine Clearance: 65 mL/min (by C-G formula based on SCr of 0.9 mg/dL). Liver Function Tests: Recent Labs  Lab 09/28/19 1355 10/05/19 0017  AST 117* 90*  ALT 184* 94*  ALKPHOS 227* 233*  BILITOT 0.7 1.3*  PROT 7.2 7.0  ALBUMIN 3.3* 3.1*   No results for input(s): LIPASE, AMYLASE in the last 168 hours. No results for input(s): AMMONIA in the last 168 hours. Coagulation Profile: Recent Labs  Lab 10/05/19 0017  INR 1.1   Cardiac Enzymes: No results for input(s): CKTOTAL, CKMB, CKMBINDEX, TROPONINI in the last 168 hours. BNP (last 3 results) No results for input(s): PROBNP in the last 8760 hours. HbA1C: No results for input(s): HGBA1C in the last 72 hours. CBG: No results for input(s): GLUCAP in the last 168 hours. Lipid Profile: No results for input(s): CHOL, HDL, LDLCALC, TRIG, CHOLHDL, LDLDIRECT in the last 72 hours. Thyroid Function Tests: No results for input(s): TSH, T4TOTAL, FREET4, T3FREE, THYROIDAB in the last 72 hours. Anemia Panel: No results for input(s): VITAMINB12, FOLATE,  FERRITIN, TIBC, IRON, RETICCTPCT in the last 72 hours. Urine analysis:    Component Value Date/Time   COLORURINE COLORLESS (A) 08/30/2019 1258   APPEARANCEUR CLEAR (A) 08/30/2019 1258   LABSPEC 1.001 (L) 08/30/2019 1258   PHURINE 7.0 08/30/2019 1258   GLUCOSEU NEGATIVE 08/30/2019 1258   HGBUR NEGATIVE  08/30/2019 [place] 08/30/2019 [place] 08/30/2019 [place] 08/30/2019 1258   NITRITE NEGATIVE 08/30/2019 1258   LEUKOCYTESUR NEGATIVE 08/30/2019 1258    Radiological Exams on Admission: CT Head Wo Contrast  Result Date: 10/05/2019 CLINICAL DATA:  Fall EXAM: CT HEAD WITHOUT CONTRAST CT CERVICAL SPINE WITHOUT CONTRAST TECHNIQUE: Multidetector CT imaging of the head and cervical spine was performed following the standard protocol without intravenous contrast. Multiplanar CT image reconstructions of the cervical spine were also generated. COMPARISON:  None. FINDINGS: CT HEAD FINDINGS Brain: There is no mass, hemorrhage or extra-axial collection. The size and configuration of the ventricles and extra-axial CSF spaces are normal. There is diffuse severe white matter hypoattenuation, possibly a sequela of radiation therapy. Vascular: No abnormal hyperdensity of the major intracranial arteries or dural venous sinuses. No intracranial atherosclerosis. Skull: The visualized skull base, calvarium and extracranial soft tissues are normal. Sinuses/Orbits: No fluid levels or advanced mucosal thickening of the visualized paranasal sinuses. No mastoid or middle ear effusion. The orbits are normal. CT CERVICAL SPINE FINDINGS Alignment: No static subluxation. Facets are aligned. Occipital condyles are normally positioned. Skull base and vertebrae: No acute fracture. Soft tissues and spinal canal: No prevertebral fluid or swelling. No visible canal hematoma. Disc levels: Multilevel facet arthrosis. There is fusion of the right C2 and C3 facets. Upper chest: Large  cavitary lesion of the left lung apex, measuring up to 3.5 cm. Other: Normal visualized paraspinal cervical soft tissues. IMPRESSION: 1. Severe chronic white matter disease, likely post radiation changes. No acute intracranial abnormality. 2. No acute fracture or static subluxation of the cervical spine. 3. Large cavitary lesion of the left lung apex, measuring up to 3.5 cm, likely a metastasis. Electronically Signed   By: Ulyses Jarred M.D.   On: 10/05/2019 01:27   CT Cervical Spine Wo Contrast  Result Date: 10/05/2019 CLINICAL DATA:  Fall EXAM: CT HEAD WITHOUT CONTRAST CT CERVICAL SPINE WITHOUT CONTRAST TECHNIQUE: Multidetector CT imaging of the head and cervical spine was performed following the standard protocol without intravenous contrast. Multiplanar CT image reconstructions of the cervical spine were also generated. COMPARISON:  None. FINDINGS: CT HEAD FINDINGS Brain: There is no mass, hemorrhage or extra-axial collection. The size and configuration of the ventricles and extra-axial CSF spaces are normal. There is diffuse severe white matter hypoattenuation, possibly a sequela of radiation therapy. Vascular: No abnormal hyperdensity of the major intracranial arteries or dural venous sinuses. No intracranial atherosclerosis. Skull: The visualized skull base, calvarium and extracranial soft tissues are normal. Sinuses/Orbits: No fluid levels or advanced mucosal thickening of the visualized paranasal sinuses. No mastoid or middle ear effusion. The orbits are normal. CT CERVICAL SPINE FINDINGS Alignment: No static subluxation. Facets are aligned. Occipital condyles are normally positioned. Skull base and vertebrae: No acute fracture. Soft tissues and spinal canal: No prevertebral fluid or swelling. No visible canal hematoma. Disc levels: Multilevel facet arthrosis. There is fusion of the right C2 and C3 facets. Upper chest: Large cavitary lesion of the left lung apex, measuring up to 3.5 cm. Other: Normal  visualized paraspinal cervical soft tissues. IMPRESSION: 1. Severe chronic white matter disease, likely post radiation changes. No acute intracranial abnormality. 2. No acute fracture or static subluxation of the cervical spine. 3. Large cavitary lesion of the left lung apex, measuring up to 3.5 cm, likely a metastasis. Electronically Signed   By: Ulyses Jarred M.D.   On: 10/05/2019 01:27   DG Chest  Port 1 View  Result Date: 10/04/2019 CLINICAL DATA:  Fevers. EXAM: PORTABLE CHEST 1 VIEW COMPARISON:  06/04/2017, 06/20/2019 PET-CT FINDINGS: Cardiac shadows within normal limits. Right chest wall port is again seen and stable. Changes of prior left mastectomy are seen. No acute bony abnormality is seen. No focal infiltrate is noted. IMPRESSION: No acute abnormality noted. Electronically Signed   By: Inez Catalina M.D.   On: 10/04/2019 23:41    EKG: Independently reviewed.   Assessment/Plan Principal Problem:   Tachyarrhythmia, with history of SVT Plan correlation with sepsis, but sepsis criteria soft, no localizing signs of infection though urinalysis still awaited.  Patient does have history of PE so this is a possibility Patient is on flecainide, history of SVT, followed by Dr. Tyrell Antonio group Follow TSH CTA chest to evaluate for PE given history :   Accidental fall from chair, initial encounter No evidence of acute injury In keeping with accidental fall and not syncopal episode Fall precautions  Muscle soreness Follow-up CK to evaluate for rhabdo  Possible sepsis ([place]) Criteria for sepsis that patient was febrile and tachycardic but no definite localizing signs Empiric treatment for possible pneumonia given large cavitary lung lesion on  CT head Iv hydration Continue Rocephin and azithromycin Follow urinalysis to explore other potential source of sepsis    Hypertension Resume home meds once meds reconciled    Metastatic breast cancer [facility]) Patient is followed by Dr. Rogue Bussing is on  chemotherapy Has known metastatic lesions to liver and brain      DVT prophylaxis: lovenox  Code Status: full  Family Communication: none  Disposition Plan: Back to previous home environment Consults called: none     Athena Masse MD [facility]     10/05/2019, 3:33 AM

## 2019-10-05 NOTE — Progress Notes (Signed)
PROGRESS NOTE    Sarah Horne  AOZ:[id number] DOB: [birth year] DOA: 10/04/2019 PCP: Arnetha Courser, MD    Assessment & Plan:   Principal Problem:   Tachyarrhythmia Active Problems:   Hypertension   Accidental fall from chair, initial encounter   Metastatic breast cancer ([place])   Sepsis ([place])    Sarah Horne is a 64 y.o. AA female with medical history significant for history of breast cancer metastatic to the brain and liver, on chemotherapy, history of GI bleed, history of PE, as well as history of SVT on flecainide who presented to the emergency room with soreness after sustaining a fall.     # Fall --Pt reported mech fall outside her house, and laid on the ground and in the cold for 2-3 hours, before she crawled back to her house.  Next day, pt fell out of her rocking chair twice. PLAN:  --PT  # Muscle soreness 2/2 mild rhabdo --pt was on the ground unable to get up for 2-3 hours. --CK 511 on presentation. PLAN: --NS@100  --Ibuprofen 400 mg TID   Tachyarrhythmia, with history of SVT CTA chest neg for PE --continue home flecainide  Possible sepsis, ruled out On presentation, patient was febrile and tachycardic but no definite localizing signs.  Empiric abx started for possible pneumonia given large cavitary lung lesion on  CT. --Procal 0.28 --d/c abx since no source of infection  Metastatic breast cancer [facility]) Patient is followed by Dr. Rogue Bussing is on chemotherapy.  Has known metastatic lesions to liver and brain --continue home decadron    DVT prophylaxis: Lovenox SQ Code Status: Full code  Family Communication: not today Disposition Plan: Home with HHPT tomorrow   Subjective and Interval History:  Pt reported soreness all over her body, and pain in her elbows and knees.  No fever, dyspnea, cough, chest pain, abdominal pain, N/V/D, dysuria, increased swelling.   Objective: Vitals:   10/05/19 1100 10/05/19 1230 10/05/19 1300 10/05/19 1357  BP: (!)  136/94 (!) 158/89 (!) 148/90 (!) 146/81  Pulse: 94  72 75  Resp: 14 18 14 16   Temp:    97.7 F (36.5 C)  TempSrc:    Oral  SpO2: 100% 99% 100% 96%  Weight:      Height:        Intake/Output Summary (Last 24 hours) at 10/05/2019 1911 Last data filed at 10/05/2019 1800 Gross per 24 hour  Intake 1710.67 ml  Output 650 ml  Net 1060.67 ml   Filed Weights   10/04/19 2216  Weight: 76.2 kg    Examination:   Constitutional: NAD, AAOx3 HEENT: conjunctivae and lids normal, EOMI CV: RRR no M,R,G. Distal pulses +2.  No cyanosis.   RESP: CTA B/L, normal respiratory effort, on RA GI: +BS, NTND Extremities: No effusions, edema in BLE SKIN: warm, dry and intact Neuro: II - XII grossly intact.  Sensation intact Psych: Normal mood and affect.  Appropriate judgement and reason   Data Reviewed: I have personally reviewed following labs and imaging studies  CBC: Recent Labs  Lab 10/05/19 0017 10/05/19 0351  WBC 5.6 4.7  NEUTROABS 4.1  --   HGB 13.3 12.2  HCT 40.4 37.3  MCV 91.8 92.8  PLT 234 962   Basic Metabolic Panel: Recent Labs  Lab 10/05/19 0017 10/05/19 0351  NA 135  --   K 3.8  --   CL 98  --   CO2 23  --   GLUCOSE 76  --  BUN 14  --   CREATININE 0.90 0.77  CALCIUM 8.5*  --    GFR: Estimated Creatinine Clearance: 73.2 mL/min (by C-G formula based on SCr of 0.77 mg/dL). Liver Function Tests: Recent Labs  Lab 10/05/19 0017  AST 90*  ALT 94*  ALKPHOS 233*  BILITOT 1.3*  PROT 7.0  ALBUMIN 3.1*   No results for input(s): LIPASE, AMYLASE in the last 168 hours. No results for input(s): AMMONIA in the last 168 hours. Coagulation Profile: Recent Labs  Lab 10/05/19 0017  INR 1.1   Cardiac Enzymes: Recent Labs  Lab 10/05/19 0351  CKTOTAL 511*   BNP (last 3 results) No results for input(s): PROBNP in the last 8760 hours. HbA1C: No results for input(s): HGBA1C in the last 72 hours. CBG: No results for input(s): GLUCAP in the last 168 hours. Lipid  Profile: No results for input(s): CHOL, HDL, LDLCALC, TRIG, CHOLHDL, LDLDIRECT in the last 72 hours. Thyroid Function Tests: Recent Labs    10/05/19 0351  TSH 0.545   Anemia Panel: No results for input(s): VITAMINB12, FOLATE, FERRITIN, TIBC, IRON, RETICCTPCT in the last 72 hours. Sepsis Labs: Recent Labs  Lab 10/05/19 0005 10/05/19 0111 10/05/19 0351  PROCALCITON  --   --  0.28  LATICACIDVEN 0.9 0.8  --     Recent Results (from the past 240 hour(s))  Blood Culture (routine x 2)     Status: None (Preliminary result)   Collection Time: 10/05/19 12:05 AM   Specimen: BLOOD  Result Value Ref Range Status   Specimen Description BLOOD BLOOD RIGHT FOREARM  Final   Special Requests   Final    BOTTLES DRAWN AEROBIC AND ANAEROBIC Blood Culture adequate volume   Culture   Final    NO GROWTH < 12 HOURS Performed at [facility], [address] [place], [place] [address]    Report Status PENDING  Incomplete  Blood Culture (routine x 2)     Status: None (Preliminary result)   Collection Time: 10/05/19  2:40 AM   Specimen: BLOOD  Result Value Ref Range Status   Specimen Description BLOOD BLOOD RIGHT HAND  Final   Special Requests   Final    BOTTLES DRAWN AEROBIC AND ANAEROBIC Blood Culture adequate volume   Culture   Final    NO GROWTH < 12 HOURS Performed at [facility], [address] [place], [place] [address]    Report Status PENDING  Incomplete  SARS CORONAVIRUS 2 (TAT 6-24 HRS) Nasopharyngeal Nasopharyngeal Swab     Status: None   Collection Time: 10/05/19  6:20 AM   Specimen: Nasopharyngeal Swab  Result Value Ref Range Status   SARS Coronavirus 2 NEGATIVE NEGATIVE Final    Comment: (NOTE) SARS-CoV-2 target nucleic acids are NOT DETECTED. The SARS-CoV-2 RNA is generally detectable in upper and lower respiratory specimens during the acute phase of infection. Negative results do not preclude SARS-CoV-2 infection, do not rule out co-infections with  other pathogens, and should not be used as the sole basis for treatment or other patient management decisions. Negative results must be combined with clinical observations, patient history, and epidemiological information. The expected result is Negative. Fact Sheet for Patients: SugarRoll.be Fact Sheet for Healthcare Providers: https://www.woods-mathews.com/ This test is not yet approved or cleared by the [place] FDA and  has been authorized for detection and/or diagnosis of SARS-CoV-2 by FDA under an Emergency Use Authorization (EUA). This EUA will remain  in effect (meaning this test can be used)  for the duration of the COVID-19 declaration under Section 56 4(b)(1) of the Act, 21 U.S.C. section 360bbb-3(b)(1), unless the authorization is terminated or revoked sooner. Performed at [facility], [place] [address] [place], [place] [address]       Radiology Studies: DG Chest 1 View  Result Date: 10/05/2019 CLINICAL DATA:  Assess if port is power injectable EXAM: CHEST  1 VIEW COMPARISON:  Radiograph 10/04/2019, CT Feb 03, 2019, radiographs 06/04/2017, 01/12/2017 FINDINGS: There is a right subclavian approach Port-A-Cath with the tip positioned at the superior cavoatrial junction. This port is currently accessed by [facility] needle. Notably, the typical labeling of the port to denote whether or not this device is power injectable is not well visualized on this exam though was present on CT images from Feb 03, 2019 and more remote radiographic comparison from 01/12/2017. Overall configuration of the device itself including positioning of the port pocket, catheter curvature and tip position appear grossly unchanged. No consolidation, features of edema, pneumothorax, or effusion. The cardiomediastinal contours are unremarkable. No acute osseous or soft tissue abnormality. IMPRESSION: 1. Port-A-Cath with the tip positioned at the superior cavoatrial  junction. This port port is currently accessed by [facility] needle. 2. Port labeling to denote whether not this devices power injectable is not well visualized on radiography but was present on comparison CT 02/03/2019 and radiograph 01/12/2017. Device appears grossly unchanged though should correlate in the patient's chart if there has been intervention upon the port since May of 2020. 3. No other acute cardiopulmonary findings. These results were called by telephone at the time of interpretation on 10/05/2019 at 6:15 am to provider [facility] , who verbally acknowledged these results. Electronically Signed   By: Lovena Le M.D.   On: 10/05/2019 06:15   CT Head Wo Contrast  Result Date: 10/05/2019 CLINICAL DATA:  Fall EXAM: CT HEAD WITHOUT CONTRAST CT CERVICAL SPINE WITHOUT CONTRAST TECHNIQUE: Multidetector CT imaging of the head and cervical spine was performed following the standard protocol without intravenous contrast. Multiplanar CT image reconstructions of the cervical spine were also generated. COMPARISON:  None. FINDINGS: CT HEAD FINDINGS Brain: There is no mass, hemorrhage or extra-axial collection. The size and configuration of the ventricles and extra-axial CSF spaces are normal. There is diffuse severe white matter hypoattenuation, possibly a sequela of radiation therapy. Vascular: No abnormal hyperdensity of the major intracranial arteries or dural venous sinuses. No intracranial atherosclerosis. Skull: The visualized skull base, calvarium and extracranial soft tissues are normal. Sinuses/Orbits: No fluid levels or advanced mucosal thickening of the visualized paranasal sinuses. No mastoid or middle ear effusion. The orbits are normal. CT CERVICAL SPINE FINDINGS Alignment: No static subluxation. Facets are aligned. Occipital condyles are normally positioned. Skull base and vertebrae: No acute fracture. Soft tissues and spinal canal: No prevertebral fluid or swelling. No visible canal hematoma. Disc  levels: Multilevel facet arthrosis. There is fusion of the right C2 and C3 facets. Upper chest: Large cavitary lesion of the left lung apex, measuring up to 3.5 cm. Other: Normal visualized paraspinal cervical soft tissues. IMPRESSION: 1. Severe chronic white matter disease, likely post radiation changes. No acute intracranial abnormality. 2. No acute fracture or static subluxation of the cervical spine. 3. Large cavitary lesion of the left lung apex, measuring up to 3.5 cm, likely a metastasis. Electronically Signed   By: Ulyses Jarred M.D.   On: 10/05/2019 01:27   CT ANGIO CHEST PE W OR WO CONTRAST  Result Date: 10/05/2019 CLINICAL DATA:  Breast carcinoma.  Tachycardia and pain EXAM: CT ANGIOGRAPHY CHEST WITH CONTRAST TECHNIQUE: Multidetector CT imaging of the chest was performed using the standard protocol during bolus administration of intravenous contrast. Multiplanar CT image reconstructions and MIPs were obtained to evaluate the vascular anatomy. CONTRAST:  58mL OMNIPAQUE IOHEXOL 350 MG/ML SOLN COMPARISON:  Chest radiograph October 05, 2019; PET-CT June 20, 2019 FINDINGS: Cardiovascular: There is no demonstrable pulmonary embolus. There is no appreciable thoracic aortic aneurysm or dissection. The visualized great vessels appear unremarkable. Right innominate and left common carotid arteries arise as a common trunk, an anatomic variant. There is no pericardial effusion or pericardial thickening. Port-A-Cath tip is in the superior vena cava. Mediastinum/Nodes: Thyroid appears unremarkable. There are several subcentimeter lymph nodes. There is no frank adenopathy by size criteria. There is a small hiatal hernia. Lungs/Pleura: There is a cavitary nodular lesion in the left apex measuring 4.5 x 3.8 cm. There is mild pneumonitis adjacent to this apparent mass. There is mild bibasilar atelectasis. No pleural effusions evident. Upper Abdomen: The liver contour is nodular, raising concern for a degree of  underlying hepatic cirrhosis. There is an area of decreased attenuation in the left lobe of the liver measuring 4.7 x 2.5 cm, concerning for potential metastasis. There are ill-defined lesions throughout the right lobe of the liver is well consistent with suspected multifocal metastatic disease. Visualized upper abdominal structures otherwise appear unremarkable. Musculoskeletal: No blastic or lytic bone lesions are demonstrable. Port is noted anteriorly on the right. Postoperative change noted in left breast. Review of the MIP images confirms the above findings. IMPRESSION: 1. No demonstrable pulmonary embolus. No thoracic aortic aneurysm or dissection. 2. Cavitary mass in the left apex region with mild surrounding pneumonitis. This lesion measures 4.5 x 3.8 cm. Neoplastic focus felt to be likely. 3. Liver has a somewhat nodular contour, likely due to underlying hepatic cirrhosis. Multiple mass lesions are noted throughout the liver, likely metastatic disease. 4.  No adenopathy by size criteria. 5.  Postoperative change left breast. 6.  Small hiatal hernia. Electronically Signed   By: Lowella Grip III M.D.   On: 10/05/2019 07:59   CT Cervical Spine Wo Contrast  Result Date: 10/05/2019 CLINICAL DATA:  Fall EXAM: CT HEAD WITHOUT CONTRAST CT CERVICAL SPINE WITHOUT CONTRAST TECHNIQUE: Multidetector CT imaging of the head and cervical spine was performed following the standard protocol without intravenous contrast. Multiplanar CT image reconstructions of the cervical spine were also generated. COMPARISON:  None. FINDINGS: CT HEAD FINDINGS Brain: There is no mass, hemorrhage or extra-axial collection. The size and configuration of the ventricles and extra-axial CSF spaces are normal. There is diffuse severe white matter hypoattenuation, possibly a sequela of radiation therapy. Vascular: No abnormal hyperdensity of the major intracranial arteries or dural venous sinuses. No intracranial atherosclerosis. Skull: The  visualized skull base, calvarium and extracranial soft tissues are normal. Sinuses/Orbits: No fluid levels or advanced mucosal thickening of the visualized paranasal sinuses. No mastoid or middle ear effusion. The orbits are normal. CT CERVICAL SPINE FINDINGS Alignment: No static subluxation. Facets are aligned. Occipital condyles are normally positioned. Skull base and vertebrae: No acute fracture. Soft tissues and spinal canal: No prevertebral fluid or swelling. No visible canal hematoma. Disc levels: Multilevel facet arthrosis. There is fusion of the right C2 and C3 facets. Upper chest: Large cavitary lesion of the left lung apex, measuring up to 3.5 cm. Other: Normal visualized paraspinal cervical soft tissues. IMPRESSION: 1. Severe chronic white matter disease, likely post radiation changes.  No acute intracranial abnormality. 2. No acute fracture or static subluxation of the cervical spine. 3. Large cavitary lesion of the left lung apex, measuring up to 3.5 cm, likely a metastasis. Electronically Signed   By: Ulyses Jarred M.D.   On: 10/05/2019 01:27   DG Chest Port 1 View  Result Date: 10/04/2019 CLINICAL DATA:  Fevers. EXAM: PORTABLE CHEST 1 VIEW COMPARISON:  06/04/2017, 06/20/2019 PET-CT FINDINGS: Cardiac shadows within normal limits. Right chest wall port is again seen and stable. Changes of prior left mastectomy are seen. No acute bony abnormality is seen. No focal infiltrate is noted. IMPRESSION: No acute abnormality noted. Electronically Signed   By: Inez Catalina M.D.   On: 10/04/2019 23:41     Scheduled Meds:  Chlorhexidine Gluconate Cloth  6 each Topical Daily   dexamethasone  2 mg Oral Q12H   enoxaparin (LOVENOX) injection  40 mg Subcutaneous Q24H   flecainide  50 mg Oral BID   ibuprofen  400 mg Oral TID   pregabalin  75 mg Oral BID   Continuous Infusions:  sodium chloride 100 mL/hr at 10/05/19 1800     LOS: 0 days     Enzo Bi, MD [facility] If 7PM-7AM, please  contact night-coverage 10/05/2019, 7:11 PM

## 2019-10-05 NOTE — ED Notes (Signed)
Pt transported to CT ?

## 2019-10-05 NOTE — ED Notes (Signed)
IP MD at bedside

## 2019-10-05 NOTE — ED Notes (Signed)
This nurse called lab and asked them to send someone to get 2nd set of blood cultures on this pt.

## 2019-10-05 NOTE — Progress Notes (Signed)
Airborne precautions discontinued per verbal order from Enzo Bi, MD.  Marry Guan, RN 10/05/2019 7:13 PM

## 2019-10-05 NOTE — Evaluation (Signed)
Physical Therapy Evaluation Patient Details Name: RASHA IBE MRN: [id number] DOB: [birth year] Today's Date: 10/05/2019   History of Present Illness  Pt is a 64 year old F admitted with sinus tachycardia s/p fall in her home.  Pt tested +for COVID-19.  PMH includes MI, neuropathy and PSVT.  Clinical Impression  Pt is a 64 year old F who lives in a one story home alone.  She is a household ambulator with intermittent use of a RW at baseline.  She was A&O but appeared with some confusion during conversation and impulsivity during mobility, also HOH.  Pt able to perform bed mobility mod I and STS with supervision once RW provided.  She completed 40 ft of ambulation with mild gait deviations indicative of fall risk.  Pt uncomfortable reaching outside BOS of lifting UE's from RW for balance testing at this time.  Pt's vitals measured upper 90% range during mobility but will need to be measured again due to equipment reading being possibly faulty.  Pt presented with poor to fair strength of UE/LE and some pain in feet during MMT due to neuropathy.  Pt will continue to benefit from skilled PT with focus on strength, tolerance to activity, balance and safe functional mobility.    Follow Up Recommendations Home health PT;Supervision - Intermittent    Equipment Recommendations  None recommended by PT    Recommendations for Other Services       Precautions / Restrictions Precautions Precautions: Fall Restrictions Weight Bearing Restrictions: No      Mobility  Bed Mobility Overal bed mobility: Modified Independent             General bed mobility comments: Increased time, pt needed cueing to lie straight in bed once she returned.  Laid diagonally with feet hanging over the edge.  Transfers Overall transfer level: Needs assistance Equipment used: Rolling walker (2 wheeled) Transfers: Sit to/from Stand Sit to Stand: Min guard;Min assist         General transfer comment: Min A to  stand without RW but able to stand without PT assist when RW introduced.  Pt appeared mildly unsteady with wide BOS.  Ambulation/Gait Ambulation/Gait assistance: Min guard Gait Distance (Feet): 40 Feet Assistive device: Rolling walker (2 wheeled)     Gait velocity interpretation: 1.31 - 2.62 ft/sec, indicative of limited community ambulator General Gait Details: Wide BOS with low foot clearance and step length.  No LOB's, pt slow with turns but able to manage RW.  O2 sat reading delayed but measured 96% once it registered.  Stairs            Wheelchair Mobility    Modified Rankin (Stroke Patients Only)       Balance Overall balance assessment: Needs assistance Sitting-balance support: Feet supported Sitting balance-Leahy Scale: Normal     Standing balance support: Bilateral upper extremity supported Standing balance-Leahy Scale: Fair Standing balance comment: Relies on RW for support, slows gait with head turns, turning and navigating obstacles.                             Pertinent Vitals/Pain Pain Assessment: No/denies pain    Home Living Family/patient expects to be discharged to:: Private residence Living Arrangements: Alone Available Help at Discharge: Family;Available PRN/intermittently Type of Home: House Home Access: Stairs to enter Entrance Stairs-Rails: Right Entrance Stairs-Number of Steps: 3 Home Layout: One level Home Equipment: Walker - 2 wheels;Bedside commode;Shower seat(Shower seat is broken.)  Prior Function Level of Independence: Independent with assistive device(s)         Comments: Uses RW on occasion for longer distances;  pt mostly household ambulator.     Hand Dominance   Dominant Hand: Right    Extremity/Trunk Assessment   Upper Extremity Assessment Upper Extremity Assessment: Generalized weakness(Grip, elbow flex/ext, shoulder flex: 4-/5 bilaterally.)    Lower Extremity Assessment Lower Extremity  Assessment: Generalized weakness(Ankle DF/PF: pain limited due to pt reported neuropathy, knee flex/ext: 4/5, hip flex: 4-/5.)    Cervical / Trunk Assessment Cervical / Trunk Assessment: Normal  Communication   Communication: No difficulties  Cognition Arousal/Alertness: Awake/alert Behavior During Therapy: WFL for tasks assessed/performed;Impulsive Overall Cognitive Status: Within Functional Limits for tasks assessed                                 General Comments: Pt appeared to be impulsive at times but also [facility] which may have been the cause.      General Comments      Exercises Other Exercises Other Exercises: Time to montor vitals: x4 min   Assessment/Plan    PT Assessment Patient needs continued PT services  PT Problem List Decreased strength;Decreased mobility;Decreased activity tolerance;Decreased balance;Decreased knowledge of use of DME       PT Treatment Interventions DME instruction;Therapeutic activities;Gait training;Therapeutic exercise;Patient/family education;Stair training;Balance training;Functional mobility training    PT Goals (Current goals can be found in the Care Plan section)  Acute Rehab PT Goals Patient Stated Goal: to return home and to be independent. PT Goal Formulation: With patient Time For Goal Achievement: 10/05/19 Potential to Achieve Goals: Good    Frequency Min 2X/week   Barriers to discharge        Co-evaluation               AM-PAC PT "6 Clicks" Mobility  Outcome Measure Help needed turning from your back to your side while in a flat bed without using bedrails?: None Help needed moving from lying on your back to sitting on the side of a flat bed without using bedrails?: A Little Help needed moving to and from a bed to a chair (including a wheelchair)?: A Little Help needed standing up from a chair using your arms (e.g., wheelchair or bedside chair)?: A Little Help needed to walk in hospital room?: A  Little Help needed climbing 3-5 steps with a railing? : A Little 6 Click Score: 19    End of Session Equipment Utilized During Treatment: Gait belt Activity Tolerance: Patient tolerated treatment well Patient left: in bed;with call bell/phone within reach;with bed alarm set Nurse Communication: Mobility status PT Visit Diagnosis: Unsteadiness on feet (R26.81);Muscle weakness (generalized) (M62.81);Difficulty in walking, not elsewhere classified (R26.2)    Time: 3419-6222 PT Time Calculation (min) (ACUTE ONLY): 31 min   Charges:   PT Evaluation $PT Eval Low Complexity: 1 Low          Roxanne Gates, PT, DPT   Roxanne Gates 10/05/2019, 4:23 PM

## 2019-10-06 LAB — CBC
HCT: 32.1 % — ABNORMAL LOW (ref 36.0–46.0)
Hemoglobin: 10.5 g/dL — ABNORMAL LOW (ref 12.0–15.0)
MCH: 30.3 pg (ref 26.0–34.0)
MCHC: 32.7 g/dL (ref 30.0–36.0)
MCV: 92.8 fL (ref 80.0–100.0)
Platelets: 162 10*3/uL (ref 150–400)
RBC: 3.46 MIL/uL — ABNORMAL LOW (ref 3.87–5.11)
RDW: 16.2 % — ABNORMAL HIGH (ref 11.5–15.5)
WBC: 3.3 10*3/uL — ABNORMAL LOW (ref 4.0–10.5)
nRBC: 0 % (ref 0.0–0.2)

## 2019-10-06 LAB — CK: Total CK: 597 U/L — ABNORMAL HIGH (ref 38–234)

## 2019-10-06 LAB — URINE CULTURE: Culture: NO GROWTH

## 2019-10-06 LAB — PROCALCITONIN: Procalcitonin: 0.34 ng/mL

## 2019-10-06 MED ORDER — HEPARIN SOD (PORK) LOCK FLUSH 100 UNIT/ML IV SOLN
500.0000 [IU] | Freq: Once | INTRAVENOUS | Status: AC
  Administered 2019-10-06: 500 [IU] via INTRAVENOUS
  Filled 2019-10-06 (×2): qty 5

## 2019-10-06 MED ORDER — SODIUM CHLORIDE 0.9 % IV SOLN: INTRAVENOUS | Status: AC

## 2019-10-06 MED ORDER — IBUPROFEN 400 MG PO TABS: 400.0000 mg | ORAL_TABLET | Freq: Three times a day (TID) | ORAL | 0 refills | Status: DC

## 2019-10-06 NOTE — Discharge Summary (Signed)
Physician Discharge Summary  Sarah Horne UXL:[id number] DOB: 1956/02/01 DOA: 10/04/2019  PCP: Arnetha Courser, MD  Admit date: 10/04/2019 Discharge date: 10/06/2019  Admitted From: Home Disposition: Home  Recommendations for Outpatient Follow-up:  1. Follow up with PCP in 1-2 weeks 2. Follow-up with your oncologist. 3. Please obtain BMP/CBC in one week 4. Please follow up on the following pending results:None  Home Health:Yes Equipment/Devices: Shower seat Discharge Condition: Stable CODE STATUS: Full Diet recommendation: Heart Healthy   Brief/Interim Summary: Sarah Horne a 64 y.o.AA femalewith medical history significant forhistory of breast cancer metastatic to the brain and liver, on chemotherapy, history of GI bleed, history of PE, as well as history of SVT on flecainidewho presented to the emergency room with soreness after sustaining a fall.   Imaging were negative for any acute fracture. She had mildly elevated CK after staying on the floor for couple of hours. She was given some IV hydration. She was advised to use ibuprofen and Tylenol for pain.  On presentation patient was febrile with some tachycardia but no definite source of infection.  Initially empiric antibiotics were started for possible pneumonia due to a large cavitary lesion on CT chest.  Patient has metastatic breast cancer and most likely disease progression in her lungs.  Antibiotics were discontinued and patient remained afebrile. She will follow-up with her oncologist for further management. She will continue her home dose of flecainide for her tachyarrhythmias.  Discharge Diagnoses:  Principal Problem:   Tachyarrhythmia Active Problems:   Hypertension   Accidental fall from chair, initial encounter   Metastatic breast cancer ([place])   Sepsis [facility])  Discharge Instructions  Discharge Instructions    Diet - low sodium heart healthy   Complete by: As directed    Increase activity slowly    Complete by: As directed      Allergies as of 10/06/2019      Reactions   No Known Allergies       Medication List    TAKE these medications   dexamethasone 2 MG tablet Commonly known as: DECADRON Take 1 pill BID [morning and afternoon]   flecainide 50 MG tablet Commonly known as: TAMBOCOR Take 1 tablet (50 mg total) by mouth 2 (two) times daily.   fluconazole 100 MG tablet Commonly known as: DIFLUCAN Take 1 tablet (100 mg total) by mouth daily.   ibuprofen 400 MG tablet Commonly known as: ADVIL Take 1 tablet (400 mg total) by mouth 3 (three) times daily.   ipratropium 0.03 % nasal spray Commonly known as: ATROVENT Place 2 sprays into both nostrils every 12 (twelve) hours.   magnesium oxide 400 MG tablet Commonly known as: MAG-OX Take 1 tablet (400 mg total) by mouth 2 (two) times daily.   meclizine 25 MG tablet Commonly known as: ANTIVERT Take 1 tablet (25 mg total) by mouth 3 (three) times daily as needed for dizziness.   montelukast 10 MG tablet Commonly known as: Singulair Take 1 tablet (10 mg total) by mouth at bedtime.   pregabalin 75 MG capsule Commonly known as: Lyrica Take 1 capsule (75 mg total) by mouth 2 (two) times daily.   Vitamin D (Ergocalciferol) 1.25 MG (50000 UT) Caps capsule Commonly known as: DRISDOL TAKE 1 CAPSULE (50,000 UNITS TOTAL) ONCE A WEEK BY MOUTH.            Durable Medical Equipment  (From admission, onward)         Start     Ordered  10/06/19 1117  For home use only DME Shower stool  Once     10/06/19 1116          Allergies  Allergen Reactions  . No Known Allergies     Consultations:  None  Procedures/Studies: DG Chest 1 View  Result Date: 10/05/2019 CLINICAL DATA:  Assess if port is power injectable EXAM: CHEST  1 VIEW COMPARISON:  Radiograph 10/04/2019, CT Feb 03, 2019, radiographs 06/04/2017, 01/12/2017 FINDINGS: There is a right subclavian approach Port-A-Cath with the tip positioned at the superior  cavoatrial junction. This port is currently accessed by [facility] needle. Notably, the typical labeling of the port to denote whether or not this device is power injectable is not well visualized on this exam though was present on CT images from Feb 03, 2019 and more remote radiographic comparison from 01/12/2017. Overall configuration of the device itself including positioning of the port pocket, catheter curvature and tip position appear grossly unchanged. No consolidation, features of edema, pneumothorax, or effusion. The cardiomediastinal contours are unremarkable. No acute osseous or soft tissue abnormality. IMPRESSION: 1. Port-A-Cath with the tip positioned at the superior cavoatrial junction. This port port is currently accessed by [facility] needle. 2. Port labeling to denote whether not this devices power injectable is not well visualized on radiography but was present on comparison CT 02/03/2019 and radiograph 01/12/2017. Device appears grossly unchanged though should correlate in the patient's chart if there has been intervention upon the port since May of 2020. 3. No other acute cardiopulmonary findings. These results were called by telephone at the time of interpretation on 10/05/2019 at 6:15 am to provider [facility] , who verbally acknowledged these results. Electronically Signed   By: Lovena Le M.D.   On: 10/05/2019 06:15   CT Head Wo Contrast  Result Date: 10/05/2019 CLINICAL DATA:  Fall EXAM: CT HEAD WITHOUT CONTRAST CT CERVICAL SPINE WITHOUT CONTRAST TECHNIQUE: Multidetector CT imaging of the head and cervical spine was performed following the standard protocol without intravenous contrast. Multiplanar CT image reconstructions of the cervical spine were also generated. COMPARISON:  None. FINDINGS: CT HEAD FINDINGS Brain: There is no mass, hemorrhage or extra-axial collection. The size and configuration of the ventricles and extra-axial CSF spaces are normal. There is diffuse severe white matter  hypoattenuation, possibly a sequela of radiation therapy. Vascular: No abnormal hyperdensity of the major intracranial arteries or dural venous sinuses. No intracranial atherosclerosis. Skull: The visualized skull base, calvarium and extracranial soft tissues are normal. Sinuses/Orbits: No fluid levels or advanced mucosal thickening of the visualized paranasal sinuses. No mastoid or middle ear effusion. The orbits are normal. CT CERVICAL SPINE FINDINGS Alignment: No static subluxation. Facets are aligned. Occipital condyles are normally positioned. Skull base and vertebrae: No acute fracture. Soft tissues and spinal canal: No prevertebral fluid or swelling. No visible canal hematoma. Disc levels: Multilevel facet arthrosis. There is fusion of the right C2 and C3 facets. Upper chest: Large cavitary lesion of the left lung apex, measuring up to 3.5 cm. Other: Normal visualized paraspinal cervical soft tissues. IMPRESSION: 1. Severe chronic white matter disease, likely post radiation changes. No acute intracranial abnormality. 2. No acute fracture or static subluxation of the cervical spine. 3. Large cavitary lesion of the left lung apex, measuring up to 3.5 cm, likely a metastasis. Electronically Signed   By: Ulyses Jarred M.D.   On: 10/05/2019 01:27   CT ANGIO CHEST PE W OR WO CONTRAST  Result Date: 10/05/2019 CLINICAL DATA:  Breast carcinoma.  Tachycardia and pain EXAM: CT ANGIOGRAPHY CHEST WITH CONTRAST TECHNIQUE: Multidetector CT imaging of the chest was performed using the standard protocol during bolus administration of intravenous contrast. Multiplanar CT image reconstructions and MIPs were obtained to evaluate the vascular anatomy. CONTRAST:  48mL OMNIPAQUE IOHEXOL 350 MG/ML SOLN COMPARISON:  Chest radiograph October 05, 2019; PET-CT June 20, 2019 FINDINGS: Cardiovascular: There is no demonstrable pulmonary embolus. There is no appreciable thoracic aortic aneurysm or dissection. The visualized great  vessels appear unremarkable. Right innominate and left common carotid arteries arise as a common trunk, an anatomic variant. There is no pericardial effusion or pericardial thickening. Port-A-Cath tip is in the superior vena cava. Mediastinum/Nodes: Thyroid appears unremarkable. There are several subcentimeter lymph nodes. There is no frank adenopathy by size criteria. There is a small hiatal hernia. Lungs/Pleura: There is a cavitary nodular lesion in the left apex measuring 4.5 x 3.8 cm. There is mild pneumonitis adjacent to this apparent mass. There is mild bibasilar atelectasis. No pleural effusions evident. Upper Abdomen: The liver contour is nodular, raising concern for a degree of underlying hepatic cirrhosis. There is an area of decreased attenuation in the left lobe of the liver measuring 4.7 x 2.5 cm, concerning for potential metastasis. There are ill-defined lesions throughout the right lobe of the liver is well consistent with suspected multifocal metastatic disease. Visualized upper abdominal structures otherwise appear unremarkable. Musculoskeletal: No blastic or lytic bone lesions are demonstrable. Port is noted anteriorly on the right. Postoperative change noted in left breast. Review of the MIP images confirms the above findings. IMPRESSION: 1. No demonstrable pulmonary embolus. No thoracic aortic aneurysm or dissection. 2. Cavitary mass in the left apex region with mild surrounding pneumonitis. This lesion measures 4.5 x 3.8 cm. Neoplastic focus felt to be likely. 3. Liver has a somewhat nodular contour, likely due to underlying hepatic cirrhosis. Multiple mass lesions are noted throughout the liver, likely metastatic disease. 4.  No adenopathy by size criteria. 5.  Postoperative change left breast. 6.  Small hiatal hernia. Electronically Signed   By: Lowella Grip III M.D.   On: 10/05/2019 07:59   CT Cervical Spine Wo Contrast  Result Date: 10/05/2019 CLINICAL DATA:  Fall EXAM: CT HEAD  WITHOUT CONTRAST CT CERVICAL SPINE WITHOUT CONTRAST TECHNIQUE: Multidetector CT imaging of the head and cervical spine was performed following the standard protocol without intravenous contrast. Multiplanar CT image reconstructions of the cervical spine were also generated. COMPARISON:  None. FINDINGS: CT HEAD FINDINGS Brain: There is no mass, hemorrhage or extra-axial collection. The size and configuration of the ventricles and extra-axial CSF spaces are normal. There is diffuse severe white matter hypoattenuation, possibly a sequela of radiation therapy. Vascular: No abnormal hyperdensity of the major intracranial arteries or dural venous sinuses. No intracranial atherosclerosis. Skull: The visualized skull base, calvarium and extracranial soft tissues are normal. Sinuses/Orbits: No fluid levels or advanced mucosal thickening of the visualized paranasal sinuses. No mastoid or middle ear effusion. The orbits are normal. CT CERVICAL SPINE FINDINGS Alignment: No static subluxation. Facets are aligned. Occipital condyles are normally positioned. Skull base and vertebrae: No acute fracture. Soft tissues and spinal canal: No prevertebral fluid or swelling. No visible canal hematoma. Disc levels: Multilevel facet arthrosis. There is fusion of the right C2 and C3 facets. Upper chest: Large cavitary lesion of the left lung apex, measuring up to 3.5 cm. Other: Normal visualized paraspinal cervical soft tissues. IMPRESSION: 1. Severe chronic white matter disease, likely post radiation changes. No acute intracranial  abnormality. 2. No acute fracture or static subluxation of the cervical spine. 3. Large cavitary lesion of the left lung apex, measuring up to 3.5 cm, likely a metastasis. Electronically Signed   By: Ulyses Jarred M.D.   On: 10/05/2019 01:27   DG Chest Port 1 View  Result Date: 10/04/2019 CLINICAL DATA:  Fevers. EXAM: PORTABLE CHEST 1 VIEW COMPARISON:  06/04/2017, 06/20/2019 PET-CT FINDINGS: Cardiac shadows  within normal limits. Right chest wall port is again seen and stable. Changes of prior left mastectomy are seen. No acute bony abnormality is seen. No focal infiltrate is noted. IMPRESSION: No acute abnormality noted. Electronically Signed   By: Inez Catalina M.D.   On: 10/04/2019 23:41    Subjective: Patient was having some left arm pain after fall.  She would like to go home today and will follow up with her oncologist early next week.  Discharge Exam: Vitals:   10/06/19 0441 10/06/19 0512  BP:    Pulse: (!) 127 (!) 115  Resp:    Temp:    SpO2:     Vitals:   10/05/19 2211 10/06/19 0430 10/06/19 0441 10/06/19 0512  BP:  119/84    Pulse: (!) 106 (!) 140 (!) 127 (!) 115  Resp:  16    Temp:  (!) 97.4 F (36.3 C)    TempSrc:  Oral    SpO2:  100%    Weight:      Height:        General: Pt is alert, awake, not in acute distress Cardiovascular: RRR, S1/S2 +, no rubs, no gallops Respiratory: CTA bilaterally, no wheezing, no rhonchi Abdominal: Soft, NT, ND, bowel sounds + Extremities: no edema, no cyanosis   The results of significant diagnostics from this hospitalization (including imaging, microbiology, ancillary and laboratory) are listed below for reference.    Microbiology: Recent Results (from the past 240 hour(s))  Blood Culture (routine x 2)     Status: None (Preliminary result)   Collection Time: 10/05/19 12:05 AM   Specimen: BLOOD  Result Value Ref Range Status   Specimen Description BLOOD BLOOD RIGHT FOREARM  Final   Special Requests   Final    BOTTLES DRAWN AEROBIC AND ANAEROBIC Blood Culture adequate volume   Culture   Final    NO GROWTH 1 DAY Performed at [facility], [address], [place], [place] [address]    Report Status PENDING  Incomplete  Blood Culture (routine x 2)     Status: None (Preliminary result)   Collection Time: 10/05/19  2:40 AM   Specimen: BLOOD  Result Value Ref Range Status   Specimen Description BLOOD BLOOD RIGHT HAND   Final   Special Requests   Final    BOTTLES DRAWN AEROBIC AND ANAEROBIC Blood Culture adequate volume   Culture   Final    NO GROWTH 1 DAY Performed at [facility], [address], [place], [place] [address]    Report Status PENDING  Incomplete  Urine culture     Status: None   Collection Time: 10/05/19  3:12 AM   Specimen: In/Out Cath Urine  Result Value Ref Range Status   Specimen Description   Final    IN/OUT CATH URINE Performed at [facility], [address] [place], [place] [address]    Special Requests   Final    NONE Performed at [facility], [address], [place], [place] [address]    Culture   Final  NO GROWTH Performed at [facility], [place] [address], [place], [place] [address]    Report Status 10/06/2019 FINAL  Final  SARS CORONAVIRUS 2 (TAT 6-24 HRS) Nasopharyngeal Nasopharyngeal Swab     Status: None   Collection Time: 10/05/19  6:20 AM   Specimen: Nasopharyngeal Swab  Result Value Ref Range Status   SARS Coronavirus 2 NEGATIVE NEGATIVE Final    Comment: (NOTE) SARS-CoV-2 target nucleic acids are NOT DETECTED. The SARS-CoV-2 RNA is generally detectable in upper and lower respiratory specimens during the acute phase of infection. Negative results do not preclude SARS-CoV-2 infection, do not rule out co-infections with other pathogens, and should not be used as the sole basis for treatment or other patient management decisions. Negative results must be combined with clinical observations, patient history, and epidemiological information. The expected result is Negative. Fact Sheet for Patients: SugarRoll.be Fact Sheet for Healthcare Providers: https://www.woods-mathews.com/ This test is not yet approved or cleared by the [place] FDA and  has been authorized for detection and/or diagnosis of SARS-CoV-2 by FDA under an Emergency Use Authorization (EUA). This EUA will  remain  in effect (meaning this test can be used) for the duration of the COVID-19 declaration under Section 56 4(b)(1) of the Act, 21 U.S.C. section 360bbb-3(b)(1), unless the authorization is terminated or revoked sooner. Performed at [facility], [place] [address] [place], [place] [address]      Labs: BNP (last 3 results) No results for input(s): BNP in the last 8760 hours. Basic Metabolic Panel: Recent Labs  Lab 10/05/19 0017 10/05/19 0351  NA 135  --   K 3.8  --   CL 98  --   CO2 23  --   GLUCOSE 76  --   BUN 14  --   CREATININE 0.90 0.77  CALCIUM 8.5*  --    Liver Function Tests: Recent Labs  Lab 10/05/19 0017  AST 90*  ALT 94*  ALKPHOS 233*  BILITOT 1.3*  PROT 7.0  ALBUMIN 3.1*   No results for input(s): LIPASE, AMYLASE in the last 168 hours. No results for input(s): AMMONIA in the last 168 hours. CBC: Recent Labs  Lab 10/05/19 0017 10/05/19 0351 10/06/19 0952  WBC 5.6 4.7 3.3*  NEUTROABS 4.1  --   --   HGB 13.3 12.2 10.5*  HCT 40.4 37.3 32.1*  MCV 91.8 92.8 92.8  PLT 234 187 162   Cardiac Enzymes: Recent Labs  Lab 10/05/19 0351 10/06/19 0952  CKTOTAL 511* 597*   BNP: Invalid input(s): POCBNP CBG: No results for input(s): GLUCAP in the last 168 hours. D-Dimer No results for input(s): DDIMER in the last 72 hours. Hgb A1c No results for input(s): HGBA1C in the last 72 hours. Lipid Profile No results for input(s): CHOL, HDL, LDLCALC, TRIG, CHOLHDL, LDLDIRECT in the last 72 hours. Thyroid function studies Recent Labs    10/05/19 0351  TSH 0.545   Anemia work up No results for input(s): VITAMINB12, FOLATE, FERRITIN, TIBC, IRON, RETICCTPCT in the last 72 hours. Urinalysis    Component Value Date/Time   COLORURINE YELLOW (A) 10/05/2019 0312   APPEARANCEUR CLEAR (A) 10/05/2019 0312   LABSPEC 1.012 10/05/2019 0312   PHURINE 6.0 10/05/2019 0312   GLUCOSEU NEGATIVE 10/05/2019 0312   HGBUR MODERATE (A) 10/05/2019 0312    BILIRUBINUR NEGATIVE 10/05/2019 0312   KETONESUR 5 (A) 10/05/2019 0312   PROTEINUR NEGATIVE 10/05/2019 0312   NITRITE NEGATIVE 10/05/2019 0312   LEUKOCYTESUR NEGATIVE 10/05/2019 7628  Sepsis Labs Invalid input(s): PROCALCITONIN,  WBC,  LACTICIDVEN Microbiology Recent Results (from the past 240 hour(s))  Blood Culture (routine x 2)     Status: None (Preliminary result)   Collection Time: 10/05/19 12:05 AM   Specimen: BLOOD  Result Value Ref Range Status   Specimen Description BLOOD BLOOD RIGHT FOREARM  Final   Special Requests   Final    BOTTLES DRAWN AEROBIC AND ANAEROBIC Blood Culture adequate volume   Culture   Final    NO GROWTH 1 DAY Performed at [facility], [address] [place], [place] [address]    Report Status PENDING  Incomplete  Blood Culture (routine x 2)     Status: None (Preliminary result)   Collection Time: 10/05/19  2:40 AM   Specimen: BLOOD  Result Value Ref Range Status   Specimen Description BLOOD BLOOD RIGHT HAND  Final   Special Requests   Final    BOTTLES DRAWN AEROBIC AND ANAEROBIC Blood Culture adequate volume   Culture   Final    NO GROWTH 1 DAY Performed at [facility], [address], [place], [place] [address]    Report Status PENDING  Incomplete  Urine culture     Status: None   Collection Time: 10/05/19  3:12 AM   Specimen: In/Out Cath Urine  Result Value Ref Range Status   Specimen Description   Final    IN/OUT CATH URINE Performed at [facility], [address] [place], [place] [address]    Special Requests   Final    NONE Performed at [facility], [address] [place], [place] [address]    Culture   Final    NO GROWTH Performed at [facility], [place] [address] [place], [place] [address]    Report Status 10/06/2019 FINAL  Final  SARS CORONAVIRUS 2 (TAT 6-24 HRS) Nasopharyngeal Nasopharyngeal Swab     Status: None   Collection Time: 10/05/19  6:20 AM   Specimen:  Nasopharyngeal Swab  Result Value Ref Range Status   SARS Coronavirus 2 NEGATIVE NEGATIVE Final    Comment: (NOTE) SARS-CoV-2 target nucleic acids are NOT DETECTED. The SARS-CoV-2 RNA is generally detectable in upper and lower respiratory specimens during the acute phase of infection. Negative results do not preclude SARS-CoV-2 infection, do not rule out co-infections with other pathogens, and should not be used as the sole basis for treatment or other patient management decisions. Negative results must be combined with clinical observations, patient history, and epidemiological information. The expected result is Negative. Fact Sheet for Patients: SugarRoll.be Fact Sheet for Healthcare Providers: https://www.woods-mathews.com/ This test is not yet approved or cleared by the [place] FDA and  has been authorized for detection and/or diagnosis of SARS-CoV-2 by FDA under an Emergency Use Authorization (EUA). This EUA will remain  in effect (meaning this test can be used) for the duration of the COVID-19 declaration under Section 56 4(b)(1) of the Act, 21 U.S.C. section 360bbb-3(b)(1), unless the authorization is terminated or revoked sooner. Performed at [facility], [place] [address], [place], [place] [address]     Time coordinating discharge: Over 30 minutes  SIGNED:  Lorella Nimrod, MD  [facility] 10/06/2019, 11:45 AM Pager [phone]  If 7PM-7AM, please contact night-coverage [email or web address] Password [email or web address]  This record has been created using Systems analyst. Errors have been sought and corrected,but may not always be located. Such creation errors do not reflect on the standard of care.

## 2019-10-06 NOTE — TOC Initial Note (Signed)
Transition of Care [facility]) - Initial/Assessment Note    Patient Details  Name: Sarah Horne MRN: [id number] Date of Birth: 1956-04-03  Transition of [facility]) CM/SW Contact:    Beverly Sessions, RN Phone Number: 10/06/2019, 3:21 PM  Clinical Narrative:                 Patient admitted from home with Tachyarrhythmia  Patient states that she lives at home alone Brother lives locally for support  At baseline she is independent and drives  PCP Lada  Denies issues obtaining medications  PT has assessed patient and recommends home health PT.  Patient agreeable, states she does not have a preference of home health agency.  Referral made to [place] with [facility].     Shower chair ordered.  Patient notified that shower chair would not be covered by insurance. BSC delivered by Jeneen Rinks with [employer] prior to discharge   Expected Discharge Plan: [place] Barriers to Discharge: No Barriers Identified   Patient Goals and CMS Choice   CMS Medicare.gov Compare Post Acute Care list provided to:: Patient    Expected Discharge Plan and Services Expected Discharge Plan: [place]   Discharge Planning Services: CM Consult Post Acute Care Choice: [place] arrangements for the past 2 months: Single Family Home Expected Discharge Date: 10/06/19               DME Arranged: 3-N-1 DME Agency: [employer] Date DME Agency Contacted: 10/06/19 Time DME Agency Contacted: [phone] Representative spoke with at DME Agency: [place] Arranged: PT, OT [place] Agency: [facility] Date [place]: 10/06/19 Time [place]: 71 Representative spoke with at [place]: [place]  Prior Living Arrangements/Services Living arrangements for the past 2 months: [place] with:: Self Patient language and need for interpreter reviewed:: Yes Do you feel safe going back to the place where you live?: Yes      Need for Family  Participation in Patient Care: Yes (Comment) Care giver support system in place?: Yes (comment) Current home services: DME Criminal Activity/Legal Involvement Pertinent to Current Situation/Hospitalization: No - Comment as needed  Activities of Daily Living Home Assistive Devices/Equipment: None ADL Screening (condition at time of admission) Patient's cognitive ability adequate to safely complete daily activities?: Yes Is the patient deaf or have difficulty hearing?: No Does the patient have difficulty seeing, even when wearing glasses/contacts?: No Does the patient have difficulty concentrating, remembering, or making decisions?: No Patient able to express need for assistance with ADLs?: Yes Does the patient have difficulty dressing or bathing?: No Independently performs ADLs?: Yes (appropriate for developmental age) Does the patient have difficulty walking or climbing stairs?: No Weakness of Legs: None Weakness of Arms/Hands: None  Permission Sought/Granted                  Emotional Assessment Appearance:: Appears stated age Attitude/Demeanor/Rapport: Gracious   Orientation: : Oriented to Self, Oriented to Place, Oriented to  Time, Oriented to Situation   Psych Involvement: No (comment)  Admission diagnosis:  Fall [W19.XXXA] Sepsis ([place]) [A41.9] Sepsis, due to unspecified organism, unspecified whether acute organ dysfunction present [facility]) [A41.9] Patient Active Problem List   Diagnosis Date Noted  . Accidental fall from chair, initial encounter 10/05/2019  . Metastatic breast cancer ([place]) 10/05/2019  . Sepsis ([place]) 10/05/2019  . Tachyarrhythmia 10/05/2019  . Brain metastasis ([place]) 05/19/2017  . Blood in stool   . Polyp of  sigmoid colon   . Benign neoplasm of transverse colon   . Benign neoplasm of ascending colon   . Lower GI bleed   . Weakness of right lower extremity 02/11/2017  . Orthostatic lightheadedness 01/14/2017  . Atrial tachycardia ([place]) 01/14/2017   . Hypokalemia 01/14/2017  . Pulmonary emboli ([place]) 01/12/2017  . Dehydration 12/25/2016  . Blood loss anemia 12/15/2016  . Ulceration of colon   . Heartburn   . Duodenitis   . Gastritis without bleeding   . GI bleed   . Acute esophagitis   . Acute esophagogastric ulcer   . Rectal bleed   . Hypovolemic shock ([place])   . Ischemic leg 12/04/2016  . Chemotherapy induced neutropenia ([place]) 11/20/2016  . Palliative care by specialist   . Goals of care, counseling/discussion   . DNR (do not resuscitate) discussion   . Cerebral edema ([place]) 11/10/2016  . Encounter for monitoring cardiotoxic drug therapy 11/09/2016  . Elevated LFTs 10/20/2016  . Peripheral neuropathy due to chemotherapy ([place]) 03/13/2016  . Encounter for antineoplastic chemotherapy 03/13/2016  . HLD (hyperlipidemia) 01/29/2016  . Hyperglycemia, unspecified 01/29/2016  . Cardiomyopathy ([place]) 01/13/2016  . Hypertension 07/18/2015  . Carcinoma of upper-outer quadrant of left breast in female, estrogen receptor positive ([place]) 12/27/2014   PCP:  Arnetha Courser, MD Pharmacy:   [id number] - [place], [place] - [address] [place] [address] Phone: [phone] Fax: [phone]  [facility] [place], [place] [address] Phone: [phone] Fax: [phone]  [facility] [place], [place] [address] [place] [address] Phone: [phone] Fax: [place], [place] [address] Phone: [phone] Fax: [phone]     Social Determinants of Health (SDOH) Interventions    Readmission Risk Interventions No flowsheet data found.

## 2019-10-06 NOTE — Progress Notes (Signed)
Sarah Horne A and O X 4. VSS. Pt tolerating diet well. No complaints of pain or nausea. IV removed intact, prescriptions given. Pt voiced understanding of discharge instructions with no further questions. Pt discharged via wheelchair with RN.    Allergies as of 10/06/2019      Reactions   No Known Allergies       Medication List    TAKE these medications   dexamethasone 2 MG tablet Commonly known as: DECADRON Take 1 pill BID [morning and afternoon]   flecainide 50 MG tablet Commonly known as: TAMBOCOR Take 1 tablet (50 mg total) by mouth 2 (two) times daily.   fluconazole 100 MG tablet Commonly known as: DIFLUCAN Take 1 tablet (100 mg total) by mouth daily.   ibuprofen 400 MG tablet Commonly known as: ADVIL Take 1 tablet (400 mg total) by mouth 3 (three) times daily.   ipratropium 0.03 % nasal spray Commonly known as: ATROVENT Place 2 sprays into both nostrils every 12 (twelve) hours.   magnesium oxide 400 MG tablet Commonly known as: MAG-OX Take 1 tablet (400 mg total) by mouth 2 (two) times daily.   meclizine 25 MG tablet Commonly known as: ANTIVERT Take 1 tablet (25 mg total) by mouth 3 (three) times daily as needed for dizziness.   montelukast 10 MG tablet Commonly known as: Singulair Take 1 tablet (10 mg total) by mouth at bedtime.   pregabalin 75 MG capsule Commonly known as: Lyrica Take 1 capsule (75 mg total) by mouth 2 (two) times daily.   Vitamin D (Ergocalciferol) 1.25 MG (50000 UT) Caps capsule Commonly known as: DRISDOL TAKE 1 CAPSULE (50,000 UNITS TOTAL) ONCE A WEEK BY MOUTH.            Durable Medical Equipment  (From admission, onward)         Start     Ordered   10/06/19 1240  For home use only DME Bedside commode  Once    Question:  Patient needs a bedside commode to treat with the following condition  Answer:  Weakness   10/06/19 1239   10/06/19 1117  For home use only DME Shower stool  Once     10/06/19 1116           Vitals:   10/06/19 0441 10/06/19 0512  BP:    Pulse: (!) 127 (!) 115  Resp:    Temp:    SpO2:      Sarah Horne

## 2019-10-10 ENCOUNTER — Inpatient Hospital Stay: Payer: Medicare HMO | Attending: Hospice and Palliative Medicine | Admitting: Hospice and Palliative Medicine

## 2019-10-10 ENCOUNTER — Telehealth: Payer: Self-pay | Admitting: *Deleted

## 2019-10-10 ENCOUNTER — Other Ambulatory Visit: Payer: Self-pay

## 2019-10-10 ENCOUNTER — Other Ambulatory Visit: Payer: Self-pay | Admitting: Internal Medicine

## 2019-10-10 DIAGNOSIS — Z170 Estrogen receptor positive status [ER+]: Secondary | ICD-10-CM | POA: Insufficient documentation

## 2019-10-10 DIAGNOSIS — R1310 Dysphagia, unspecified: Secondary | ICD-10-CM | POA: Insufficient documentation

## 2019-10-10 DIAGNOSIS — Z515 Encounter for palliative care: Secondary | ICD-10-CM | POA: Insufficient documentation

## 2019-10-10 DIAGNOSIS — M8580 Other specified disorders of bone density and structure, unspecified site: Secondary | ICD-10-CM | POA: Insufficient documentation

## 2019-10-10 DIAGNOSIS — C7931 Secondary malignant neoplasm of brain: Secondary | ICD-10-CM | POA: Insufficient documentation

## 2019-10-10 DIAGNOSIS — C787 Secondary malignant neoplasm of liver and intrahepatic bile duct: Secondary | ICD-10-CM | POA: Insufficient documentation

## 2019-10-10 DIAGNOSIS — C50412 Malignant neoplasm of upper-outer quadrant of left female breast: Secondary | ICD-10-CM | POA: Insufficient documentation

## 2019-10-10 DIAGNOSIS — Z7952 Long term (current) use of systemic steroids: Secondary | ICD-10-CM | POA: Insufficient documentation

## 2019-10-10 DIAGNOSIS — R7989 Other specified abnormal findings of blood chemistry: Secondary | ICD-10-CM | POA: Insufficient documentation

## 2019-10-10 DIAGNOSIS — Z5111 Encounter for antineoplastic chemotherapy: Secondary | ICD-10-CM | POA: Insufficient documentation

## 2019-10-10 DIAGNOSIS — T451X5A Adverse effect of antineoplastic and immunosuppressive drugs, initial encounter: Secondary | ICD-10-CM

## 2019-10-10 DIAGNOSIS — R5383 Other fatigue: Secondary | ICD-10-CM | POA: Insufficient documentation

## 2019-10-10 DIAGNOSIS — I252 Old myocardial infarction: Secondary | ICD-10-CM | POA: Insufficient documentation

## 2019-10-10 DIAGNOSIS — G620 Drug-induced polyneuropathy: Secondary | ICD-10-CM

## 2019-10-10 DIAGNOSIS — G720 Drug-induced myopathy: Secondary | ICD-10-CM | POA: Insufficient documentation

## 2019-10-10 DIAGNOSIS — Z791 Long term (current) use of non-steroidal anti-inflammatories (NSAID): Secondary | ICD-10-CM | POA: Insufficient documentation

## 2019-10-10 DIAGNOSIS — T380X5A Adverse effect of glucocorticoids and synthetic analogues, initial encounter: Secondary | ICD-10-CM | POA: Insufficient documentation

## 2019-10-10 DIAGNOSIS — Z803 Family history of malignant neoplasm of breast: Secondary | ICD-10-CM | POA: Insufficient documentation

## 2019-10-10 DIAGNOSIS — R531 Weakness: Secondary | ICD-10-CM | POA: Insufficient documentation

## 2019-10-10 DIAGNOSIS — R5381 Other malaise: Secondary | ICD-10-CM | POA: Insufficient documentation

## 2019-10-10 DIAGNOSIS — I10 Essential (primary) hypertension: Secondary | ICD-10-CM | POA: Insufficient documentation

## 2019-10-10 DIAGNOSIS — R000 Tachycardia, unspecified: Secondary | ICD-10-CM | POA: Insufficient documentation

## 2019-10-10 DIAGNOSIS — Z86711 Personal history of pulmonary embolism: Secondary | ICD-10-CM | POA: Insufficient documentation

## 2019-10-10 DIAGNOSIS — F17210 Nicotine dependence, cigarettes, uncomplicated: Secondary | ICD-10-CM | POA: Insufficient documentation

## 2019-10-10 DIAGNOSIS — E785 Hyperlipidemia, unspecified: Secondary | ICD-10-CM | POA: Insufficient documentation

## 2019-10-10 DIAGNOSIS — Z79899 Other long term (current) drug therapy: Secondary | ICD-10-CM | POA: Insufficient documentation

## 2019-10-10 LAB — CULTURE, BLOOD (ROUTINE X 2)
Culture: NO GROWTH
Culture: NO GROWTH
Special Requests: ADEQUATE
Special Requests: ADEQUATE

## 2019-10-10 NOTE — Telephone Encounter (Signed)
Thank you :)

## 2019-10-10 NOTE — Telephone Encounter (Signed)
Per [facility] - Home health will round on patient on Friday 1/15

## 2019-10-10 NOTE — Telephone Encounter (Signed)
Left vm for [facility] to return my call re: this patient. Patient set up for [facility] s/p HP discharge. Per Merrily Pew, NP- pt has not been contacted by [facility].

## 2019-10-10 NOTE — Progress Notes (Signed)
Virtual Visit via Telephone Note  I connected with Sarah Horne on 10/10/19 at 10:30 AM EST by telephone and verified that I am speaking with the correct person using two identifiers.   I discussed the limitations, risks, security and privacy concerns of performing an evaluation and management service by telephone and the availability of in person appointments. I also discussed with the patient that there may be a patient responsible charge related to this service. The patient expressed understanding and agreed to proceed.   History of Present Illness: Palliative Care consult requested for this 64 y.o. female with multiple medical problems including stage IV breast cancer with leptomeningeal and liver metastases who has had disease progression on multiple previous lines of treatment.  MRI of the brain on 05/06/2019 shows a several new metastatic lesions.  MRI of the brain on 07/06/2019 also showed evidence of disease progression with enlarging existing lesions and note of interval new metastatic lesions.  Patient was referred to palliative care to help address goals and manage ongoing symptoms.   Observations/Objective: Called and spoke with patient by phone.  Patient was hospitalized 10/04/2019-10/06/2019 with weakness, fever, and tachycardia without clear source of infection.  She was treated for possible pneumonia.  Patient had fallen prior to her hospitalization and was too weak to get up.  Patient says that she is doing reasonably well since discharging her from the hospital.  She feels she is mostly returned to her baseline but she remains weak.  She has had some difficulty showering.  A 3 and 1 shower chair was ordered but she says it is too large for her bathroom.  It appears that home health was ordered but has yet to start.  Patient denies any distressing symptoms.  Patient reports improved appetite and fatigue.  Assessment and Plan: Stage IV breast cancer -patient is status post RT now  on chemotherapy with paclitaxel.  Patient sees Dr. Rogue Bussing later this week.  Weakness -has home health ordered but is yet to start.  Will reach out to home health company-[facility].  Home safety was discussed.  We will provide her with information for a life alert program.  Follow Up Instructions: Follow-up telephone visit and 4 weeks   I discussed the assessment and treatment plan with the patient. The patient was provided an opportunity to ask questions and all were answered. The patient agreed with the plan and demonstrated an understanding of the instructions.   The patient was advised to call back or seek an in-person evaluation if the symptoms worsen or if the condition fails to improve as anticipated.  I provided 8 minutes of non-face-to-face time during this encounter.   Irean Hong, NP

## 2019-10-12 ENCOUNTER — Other Ambulatory Visit: Payer: Self-pay

## 2019-10-12 ENCOUNTER — Encounter: Payer: Self-pay | Admitting: Internal Medicine

## 2019-10-13 ENCOUNTER — Inpatient Hospital Stay (HOSPITAL_BASED_OUTPATIENT_CLINIC_OR_DEPARTMENT_OTHER): Payer: Medicare HMO | Admitting: Internal Medicine

## 2019-10-13 ENCOUNTER — Inpatient Hospital Stay: Payer: Medicare HMO

## 2019-10-13 ENCOUNTER — Other Ambulatory Visit: Payer: Self-pay

## 2019-10-13 VITALS — BP 136/92 | HR 106 | Resp 18

## 2019-10-13 DIAGNOSIS — I252 Old myocardial infarction: Secondary | ICD-10-CM | POA: Diagnosis not present

## 2019-10-13 DIAGNOSIS — C7931 Secondary malignant neoplasm of brain: Secondary | ICD-10-CM | POA: Diagnosis not present

## 2019-10-13 DIAGNOSIS — Z5111 Encounter for antineoplastic chemotherapy: Secondary | ICD-10-CM | POA: Diagnosis not present

## 2019-10-13 DIAGNOSIS — C50412 Malignant neoplasm of upper-outer quadrant of left female breast: Secondary | ICD-10-CM

## 2019-10-13 DIAGNOSIS — Z170 Estrogen receptor positive status [ER+]: Secondary | ICD-10-CM

## 2019-10-13 DIAGNOSIS — F17210 Nicotine dependence, cigarettes, uncomplicated: Secondary | ICD-10-CM | POA: Diagnosis not present

## 2019-10-13 DIAGNOSIS — C787 Secondary malignant neoplasm of liver and intrahepatic bile duct: Secondary | ICD-10-CM | POA: Diagnosis not present

## 2019-10-13 DIAGNOSIS — E785 Hyperlipidemia, unspecified: Secondary | ICD-10-CM | POA: Diagnosis not present

## 2019-10-13 DIAGNOSIS — G620 Drug-induced polyneuropathy: Secondary | ICD-10-CM | POA: Diagnosis not present

## 2019-10-13 DIAGNOSIS — I10 Essential (primary) hypertension: Secondary | ICD-10-CM | POA: Diagnosis not present

## 2019-10-13 DIAGNOSIS — Z7952 Long term (current) use of systemic steroids: Secondary | ICD-10-CM | POA: Diagnosis not present

## 2019-10-13 DIAGNOSIS — R5383 Other fatigue: Secondary | ICD-10-CM | POA: Diagnosis not present

## 2019-10-13 DIAGNOSIS — M8580 Other specified disorders of bone density and structure, unspecified site: Secondary | ICD-10-CM | POA: Diagnosis not present

## 2019-10-13 DIAGNOSIS — Z7189 Other specified counseling: Secondary | ICD-10-CM

## 2019-10-13 DIAGNOSIS — G720 Drug-induced myopathy: Secondary | ICD-10-CM | POA: Diagnosis not present

## 2019-10-13 DIAGNOSIS — C184 Malignant neoplasm of transverse colon: Secondary | ICD-10-CM | POA: Diagnosis not present

## 2019-10-13 DIAGNOSIS — R5381 Other malaise: Secondary | ICD-10-CM | POA: Diagnosis not present

## 2019-10-13 DIAGNOSIS — D702 Other drug-induced agranulocytosis: Secondary | ICD-10-CM | POA: Diagnosis not present

## 2019-10-13 DIAGNOSIS — C182 Malignant neoplasm of ascending colon: Secondary | ICD-10-CM | POA: Diagnosis not present

## 2019-10-13 DIAGNOSIS — T380X5A Adverse effect of glucocorticoids and synthetic analogues, initial encounter: Secondary | ICD-10-CM | POA: Diagnosis not present

## 2019-10-13 DIAGNOSIS — T451X5D Adverse effect of antineoplastic and immunosuppressive drugs, subsequent encounter: Secondary | ICD-10-CM | POA: Diagnosis not present

## 2019-10-13 DIAGNOSIS — R000 Tachycardia, unspecified: Secondary | ICD-10-CM | POA: Diagnosis not present

## 2019-10-13 DIAGNOSIS — R531 Weakness: Secondary | ICD-10-CM | POA: Diagnosis not present

## 2019-10-13 DIAGNOSIS — Z515 Encounter for palliative care: Secondary | ICD-10-CM | POA: Diagnosis not present

## 2019-10-13 DIAGNOSIS — R7989 Other specified abnormal findings of blood chemistry: Secondary | ICD-10-CM | POA: Diagnosis not present

## 2019-10-13 DIAGNOSIS — T451X5A Adverse effect of antineoplastic and immunosuppressive drugs, initial encounter: Secondary | ICD-10-CM | POA: Diagnosis not present

## 2019-10-13 DIAGNOSIS — Z791 Long term (current) use of non-steroidal anti-inflammatories (NSAID): Secondary | ICD-10-CM | POA: Diagnosis not present

## 2019-10-13 DIAGNOSIS — Z95828 Presence of other vascular implants and grafts: Secondary | ICD-10-CM

## 2019-10-13 DIAGNOSIS — Z79899 Other long term (current) drug therapy: Secondary | ICD-10-CM | POA: Diagnosis not present

## 2019-10-13 DIAGNOSIS — R1310 Dysphagia, unspecified: Secondary | ICD-10-CM | POA: Diagnosis not present

## 2019-10-13 LAB — CBC WITH DIFFERENTIAL/PLATELET
Abs Immature Granulocytes: 0.02 10*3/uL (ref 0.00–0.07)
Basophils Absolute: 0 10*3/uL (ref 0.0–0.1)
Basophils Relative: 0 %
Eosinophils Absolute: 0 10*3/uL (ref 0.0–0.5)
Eosinophils Relative: 1 %
HCT: 41.1 % (ref 36.0–46.0)
Hemoglobin: 13.2 g/dL (ref 12.0–15.0)
Immature Granulocytes: 0 %
Lymphocytes Relative: 18 %
Lymphs Abs: 0.9 10*3/uL (ref 0.7–4.0)
MCH: 30.5 pg (ref 26.0–34.0)
MCHC: 32.1 g/dL (ref 30.0–36.0)
MCV: 94.9 fL (ref 80.0–100.0)
Monocytes Absolute: 0.5 10*3/uL (ref 0.1–1.0)
Monocytes Relative: 9 %
Neutro Abs: 3.7 10*3/uL (ref 1.7–7.7)
Neutrophils Relative %: 72 %
Platelets: 301 10*3/uL (ref 150–400)
RBC: 4.33 MIL/uL (ref 3.87–5.11)
RDW: 18 % — ABNORMAL HIGH (ref 11.5–15.5)
WBC: 5.2 10*3/uL (ref 4.0–10.5)
nRBC: 0 % (ref 0.0–0.2)

## 2019-10-13 LAB — COMPREHENSIVE METABOLIC PANEL
ALT: 109 U/L — ABNORMAL HIGH (ref 0–44)
AST: 111 U/L — ABNORMAL HIGH (ref 15–41)
Albumin: 3.3 g/dL — ABNORMAL LOW (ref 3.5–5.0)
Alkaline Phosphatase: 326 U/L — ABNORMAL HIGH (ref 38–126)
Anion gap: 10 (ref 5–15)
BUN: 10 mg/dL (ref 8–23)
CO2: 29 mmol/L (ref 22–32)
Calcium: 8.9 mg/dL (ref 8.9–10.3)
Chloride: 100 mmol/L (ref 98–111)
Creatinine, Ser: 0.74 mg/dL (ref 0.44–1.00)
GFR calc Af Amer: >60 mL/min (ref >60)
GFR calc non Af Amer: >60 mL/min (ref >60)
Glucose, Bld: 96 mg/dL (ref 70–99)
Potassium: 3.2 mmol/L — ABNORMAL LOW (ref 3.5–5.1)
Sodium: 139 mmol/L (ref 135–145)
Total Bilirubin: 1.3 mg/dL — ABNORMAL HIGH (ref 0.3–1.2)
Total Protein: 7.3 g/dL (ref 6.5–8.1)

## 2019-10-13 MED ORDER — SODIUM CHLORIDE 0.9 % IV SOLN
10.0000 mg | Freq: Once | INTRAVENOUS | Status: AC
  Administered 2019-10-13: 10 mg via INTRAVENOUS
  Filled 2019-10-13: qty 10

## 2019-10-13 MED ORDER — HEPARIN SOD (PORK) LOCK FLUSH 100 UNIT/ML IV SOLN
500.0000 [IU] | Freq: Once | INTRAVENOUS | Status: AC | PRN
  Administered 2019-10-13: 500 [IU]
  Filled 2019-10-13: qty 5

## 2019-10-13 MED ORDER — SODIUM CHLORIDE 0.9 % IV SOLN
Freq: Once | INTRAVENOUS | Status: AC
  Filled 2019-10-13: qty 250

## 2019-10-13 MED ORDER — METOPROLOL TARTRATE 50 MG PO TABS
50.0000 mg | ORAL_TABLET | Freq: Once | ORAL | Status: AC
  Administered 2019-10-13: 50 mg via ORAL
  Filled 2019-10-13: qty 1

## 2019-10-13 MED ORDER — SODIUM CHLORIDE 0.9% FLUSH
10.0000 mL | Freq: Once | INTRAVENOUS | Status: AC
  Administered 2019-10-13: 10 mL via INTRAVENOUS
  Filled 2019-10-13: qty 10

## 2019-10-13 MED ORDER — HEPARIN SOD (PORK) LOCK FLUSH 100 UNIT/ML IV SOLN
INTRAVENOUS | Status: AC
  Filled 2019-10-13: qty 5

## 2019-10-13 MED ORDER — DOXORUBICIN HCL CHEMO IV INJECTION 2 MG/ML
20.0000 mg/m2 | Freq: Once | INTRAVENOUS | Status: AC
  Administered 2019-10-13: 38 mg via INTRAVENOUS
  Filled 2019-10-13: qty 19

## 2019-10-13 MED ORDER — PALONOSETRON HCL INJECTION 0.25 MG/5ML
0.2500 mg | Freq: Once | INTRAVENOUS | Status: AC
  Administered 2019-10-13: 0.25 mg via INTRAVENOUS
  Filled 2019-10-13: qty 5

## 2019-10-13 MED ORDER — METOPROLOL TARTRATE 25 MG PO TABS: 25.0000 mg | ORAL_TABLET | Freq: Two times a day (BID) | ORAL | 1 refills | Status: DC

## 2019-10-13 NOTE — Progress Notes (Signed)
Vitals and lab results reviewed with MD and treatment team. Per MD to continue with treatment with HR-120s. New orders placed by MD. Metoprolol 50 mg PO given at 1035 prior to treatment.  Pt tolerated Doxorubicin injection well with no signs of complications or reaction. Prior to discharge, HR-106. MD made aware. Pt stable for discharge. RN educated pt on the importance of notifying the clinic if any complications occur at home and if it is an emergency to call 911. Pt verbalized understanding and all questions answered at this time.   Sarah Horne [employer]

## 2019-10-13 NOTE — Assessment & Plan Note (Addendum)
#  Recurrent metastatic breast cancer-ER PR positive HER-2 negative; NOV 2020- PROGRESSION in liver- multiple enhancing lesions noted.  Most recently on Taxol weekly chemo; worsening.  #Proceed with Adriamycin weekly dose cycle #1 today. Labs today reviewed;  acceptable for treatment today-however LFTs elevated  #Sinus tachycardia 120s to 130s; recent EKG-reviewed sinus tachycardia.  Recommend adding metoprolol 25 mg twice daily.  50 mg metoprolol prior to treatment today.  # Oral thrush-s/p Diflucan-resolved.  # Elevated LFTs-likely secondary to progressive malignancy.  Monitor closely.  # Brain metastases; Recurrent-s/p RT finshed 11/17.  MRI- improved; STOP dexamethasone today.  Stable.  # Peripheral neuropathy grade 1-2-  on Lyrica; stable.  #Debility/generalized weakness-questions for steroid myopathy-shower chair.  #Prognosis-discussed with the patient regarding the overall poor prognosis; and high risk of progressive disease/poor response to chemotherapy/and the risk of side effects.  Wants to proceed with treatment knowing risk of serious adverse events.  # DISPOSITION:  # adriamycin today # 1 week- Josh # 1 week- MD; labs- cbc/cmp; ; adriamycin weekly- Dr.B

## 2019-10-15 ENCOUNTER — Other Ambulatory Visit: Payer: Self-pay | Admitting: Internal Medicine

## 2019-10-16 DIAGNOSIS — C787 Secondary malignant neoplasm of liver and intrahepatic bile duct: Secondary | ICD-10-CM | POA: Diagnosis not present

## 2019-10-16 DIAGNOSIS — G620 Drug-induced polyneuropathy: Secondary | ICD-10-CM | POA: Diagnosis not present

## 2019-10-16 DIAGNOSIS — T451X5D Adverse effect of antineoplastic and immunosuppressive drugs, subsequent encounter: Secondary | ICD-10-CM | POA: Diagnosis not present

## 2019-10-16 DIAGNOSIS — C184 Malignant neoplasm of transverse colon: Secondary | ICD-10-CM | POA: Diagnosis not present

## 2019-10-16 DIAGNOSIS — C182 Malignant neoplasm of ascending colon: Secondary | ICD-10-CM | POA: Diagnosis not present

## 2019-10-16 DIAGNOSIS — C7931 Secondary malignant neoplasm of brain: Secondary | ICD-10-CM | POA: Diagnosis not present

## 2019-10-16 DIAGNOSIS — D702 Other drug-induced agranulocytosis: Secondary | ICD-10-CM | POA: Diagnosis not present

## 2019-10-16 DIAGNOSIS — C50412 Malignant neoplasm of upper-outer quadrant of left female breast: Secondary | ICD-10-CM | POA: Diagnosis not present

## 2019-10-16 DIAGNOSIS — I10 Essential (primary) hypertension: Secondary | ICD-10-CM | POA: Diagnosis not present

## 2019-10-16 NOTE — Progress Notes (Signed)
[facility] OFFICE PROGRESS NOTE  Patient Care Team: Lada, Satira Anis, MD as PCP - General (Family Medicine) Wellington Hampshire, MD as PCP - Cardiology (Cardiology) Byrnett, Forest Gleason, MD (General Surgery) [name], MD (Inactive) as Attending Physician (Internal Medicine)  Cancer Staging No matching staging information was found for the patient.   Oncology History Overview Note  # LEFT BREAST IDC; STAGE II [cT2N1] ER >90%; PR- 50-90%; her 2 NEU- POS; s/p Neoadj chemo; AUG 2016- TCH+P s/p Lumpec & partial ALND- path CR; s/p RT [finished Nov 2016];  adj Herceptin; HELD for Jan 19th 2017 [in DEC 2016-EF dropped from 63 to 51%; FEB 27th EF-42.9%]; JAN 2016 START Arimidex; May 2017- EF- 60%; May 24th 2017-Re-start Herceptin q 3W; July 28th STOP herceptin [finished 28m/m2; sec to Low EF; however 2017 Oct EF= 67%; improved]  # DEC 4th 2017- Start Neratinib 4 pills; DEC 11th 3 pills; STOPPED.  # FEB 2018- RECURRENCE ER/PR positive; Her 2 NEGATIVE [liver Bx]  # MARCH 1st 2018- Tax-Cytoxan [poor tol]  # FEB 2018- Brain mets [SBRT; Dr.Crystal; finished Feb 28th 2018]  # March 2018- Eribulin- poor tolerance  # June 8th 2018- Started Faslodex + Abema [abema-multiple interruptions sec to neutropenia]  # MRI Brain- March 2019- Leptomeningeal mets [WBRT; No LP s/p WBRT- 01/09/2018]; STOP Abema [546mday-sec to severe cytopenia]+faslodex  # MAY 20tth 019- Start Xeloda; STOPPED in Nov 22nd 2019- sec to Progressive liver lesions on CT scan  # NOV 26th 2019- Faslodex + Piqaray 25052m; Jan 2020- Piqray 300 mg+ faslodex; FEB 17th CT scan- mixed response/ Overall progression; STOP Piqray + faslodex.   # FEB 25th 2020- ERIBULIN; Poor tolerance sec to severe neutropenia.  Stopped May 2020.  CT scan May 2020-new/progressive 2 cm liver lesion;   #July nd 2020-Ixempra x5 cycles-; NOV 2020-MRI liver- worsening liver lesions [rising AST/ALT].   # NOV [id number] weekly [consent-];  December 31-2020-progression/rising LFTs.  Stop Taxol  #October 04, 2019-Adriamycin weekly [C]  #May 06, 2019-MRI brain 5 new lesions [3 mm to 10 mm]-asymptomatic; [previous whole brain radiation]- OCT 2020- Re-Irradiation.   # PN- G-2; may 2017- Cymbalta 6m49mMARCH 2018-  acute vascular insuff of BIL LE [? Taxotere vasoconstriction on asprin]; # hemorragic shock LGIB [s/p colo; Dr.Wohl]  #  SVT- on flecainide.   # April 2016- Liver Bx- NEG;   # Drop in EF from Herceptin [recovered OCt 27th- EF 65%]  # BMD- jan 2017- osteopenia  # 2020- SEP-s/p Pallaitive care evaluation  -----------------------------------------------------------     [place]- Sep 4th 2018- PDL-1 0%/NEG for BRCA; MSI-Stable; Positive for PI3K; ccnd-1; FGF** -----------------------------------------------------------  Dx: Metastatic Breast cancer [ER/PR-Pos; her -2 NEG] Stage IV; goals: palliative Current treatment-Adriamycin [C]   Carcinoma of upper-outer quadrant of left breast in female, estrogen receptor positive (HCC)[place]/25/2020 - 01/17/2019 Chemotherapy   The patient had eriBULin mesylate (HALAVEN) 2.1 mg in sodium chloride 0.9 % 100 mL chemo infusion, 1.2 mg/m2 = 2.1 mg (100 % of original dose 1.2 mg/m2), Intravenous,  Once, 3 of 4 cycles Dose modification: 1.2 mg/m2 (original dose 1.2 mg/m2, Cycle 1, Reason: Provider Judgment), 0.8 mg/m2 (original dose 1.2 mg/m2, Cycle 3, Reason: Provider Judgment) Administration: 2.1 mg (11/22/2018), 2.1 mg (11/29/2018), 2.1 mg (12/20/2018), 1.4 mg (01/17/2019)  for chemotherapy treatment.    03/30/2019 - 07/12/2019 Chemotherapy   The patient had pegfilgrastim (NEULASTA ONPRO KIT) injection 6 mg, 6 mg, Subcutaneous, Once, 5 of 8 cycles Administration: 6 mg (  03/30/2019), 6 mg (04/20/2019), 6 mg (05/11/2019), 6 mg (06/22/2019), 6 mg (06/01/2019) ixabepilone (IXEMPRA) 60 mg in lactated ringers 250 mL chemo infusion, 33.5 mg/m2 = 57 mg (100 % of original dose 32  mg/m2), Intravenous,  Once, 5 of 8 cycles Dose modification: 32 mg/m2 (original dose 32 mg/m2, Cycle 1, Reason: Provider Judgment) Administration: 72 mg (04/20/2019), 72 mg (05/11/2019), 72 mg (06/22/2019), 72 mg (06/01/2019)  for chemotherapy treatment.    08/18/2019 - 09/28/2019 Chemotherapy   The patient had PACLitaxel (TAXOL) 150 mg in sodium chloride 0.9 % 250 mL chemo infusion (</= '80mg'$ /m2), 80 mg/m2 = 150 mg, Intravenous,  Once, 1 of 4 cycles Administration: 150 mg (08/18/2019), 150 mg (09/08/2019), 150 mg (09/15/2019)  for chemotherapy treatment.    10/13/2019 -  Chemotherapy   The patient had DOXOrubicin (ADRIAMYCIN) chemo injection 38 mg, 20 mg/m2 = 38 mg, Intravenous,  Once, 1 of 4 cycles Administration: 38 mg (10/13/2019) palonosetron (ALOXI) injection 0.25 mg, 0.25 mg, Intravenous,  Once, 1 of 4 cycles Administration: 0.25 mg (10/13/2019)  for chemotherapy treatment.      INTERVAL HISTORY:  Sarah Horne 64 y.o.  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  Patient was recently evaluated emergency room for "sepsis".  Patient received antibiotics IV fluids.  No source of infection/sepsis found.  Patient continues to complains of mild to moderate fatigue.  Swelling the legs improved.  No fevers or chills.  Apparatus good.  No weight loss.  No abdominal pain nausea vomiting.  Chronic tingling numbness.    Review of Systems  Constitutional: Positive for malaise/fatigue. Negative for chills, diaphoresis, fever and weight loss.  HENT: Negative for nosebleeds and sore throat.   Eyes: Negative for double vision.  Respiratory: Negative for cough, hemoptysis, sputum production, shortness of breath and wheezing.   Cardiovascular: Negative for chest pain, palpitations, orthopnea and leg swelling.  Gastrointestinal: Negative for abdominal pain,  blood in stool, constipation, heartburn, melena, nausea and vomiting.  Genitourinary: Negative for dysuria, frequency and urgency.  Musculoskeletal: Positive for joint pain. Negative for back pain.  Skin: Negative for itching.  Neurological: Positive for tingling. Negative for dizziness, focal weakness, weakness and headaches.  Endo/Heme/Allergies: Does not bruise/bleed easily.  Psychiatric/Behavioral: Negative for depression. The patient is not nervous/anxious and does not have insomnia.     PAST MEDICAL HISTORY :  Past Medical History:  Diagnosis Date  . Chemotherapy-induced peripheral neuropathy ([place])   . Epistaxis    a. 11/2016 in setting of asa/plavix-->silver nitrate cauterization.  . GI bleed    a. 11/2016 Admission w/ GIB and hypovolemic shock req 3u PRBC's;  b. 11/2016 ECG: gastritis & nonbleeding peptic ulcer; c. 11/2016 Conlonoscopy: rectal and sigmoid colonic ulcers.  . Heart attack ([place])    a. 1998 Cath @ [facility]: reportedly no intervention required.  Marland Kitchen Herceptin-induced cardiomyopathy ([place])    a. In the setting of Herceptin Rx for breast cancer (initiated 12/2014); b. 03/2015 MUGA EF 64%; b. 08/2015 MUGA: EF 51%; c. 10/2015 MUGA: EF 44%; d. 11/2015 Echo: EF 45-50%; e. 01/2016 MUGA: EF 60%; f. 06/2016 MUGA EF 65%; g. 10/2016 MUGA: EF 61%;  h. 12/2016 Echo: EF 55-60%, gr1 DD.  Marland Kitchen Hyperlipidemia   . Hypertension   . Neuropathy   . Possible PAD (peripheral artery disease) ([place])    a. 11/2016 LE cyanosis and weak pulses-->CTA w/o significant Ao-BiFem dzs. ? distal dzs-->ASA/Plavix  initiated by vascular surgery.  Marland Kitchen PSVT (paroxysmal supraventricular tachycardia) ([place])    a. Dx 11/2016.  . Pulmonary embolism ([place])    a. 12/2016 CTA Chest: small nonocclusive PE in inferior segment of the Left lingula, somewhat eccentric filling defect suggesting chronic rather than acute embolic event; b. 02/7892 LE U/S:  No DVT; c. 12/2016 Echo: Nl RV fxn, nl PASP.  Marland Kitchen Recurrent Metastatic breast cancer ([place])    a. Dx  2016: Stage II, ER positive, PR positive, HER-2/neu overexpressing of the left breast-->chemo/radiation; b. 10/2016 CT Abd/pelvis: diffuse liver mets, ill defined sclerotic bone lesions-T12;  c. 10/2016 MRI brain: metastatic lesion along L temporal lobe (19x79m) w/ extensive surrounding edema & 593mmidline shift to right.  . Sinus tachycardia     PAST SURGICAL HISTORY :   Past Surgical History:  Procedure Laterality Date  . BREAST BIOPSY Left 2016   Positive  . BREAST LUMPECTOMY WITH SENTINEL LYMPH NODE BIOPSY Left 05/23/2015   Procedure: LEFT BREAST WIDE EXCISION WITH AXILLARY DISSECTION, MASTOPLASTY ;  Surgeon: JeRobert BellowMD;  Location: [facility] ORS;  Service: General;  Laterality: Left;  . BREAST SURGERY Left 12/18/14   breast biopsy/INVASIVE DUCTAL CARCINOMA OF BREAST, NOTTINGHAM GRADE 2.  . Marland KitchenREAST SURGERY  05/23/2015.   Wide excision/mastoplasty, axillary dissection. No residual invasive cancer, positive for residual DCIS. 0/2 nodes identified on axillary dissection. (no SLN by technetium or methylene blue)  . CARDIAC CATHETERIZATION    . COLONOSCOPY WITH PROPOFOL N/A 12/10/2016   Procedure: COLONOSCOPY WITH PROPOFOL;  Surgeon: DaLucilla LameMD;  Location: [facility] ENDOSCOPY;  Service: Endoscopy;  Laterality: N/A;  . COLONOSCOPY WITH PROPOFOL N/A 02/13/2017   Procedure: COLONOSCOPY WITH PROPOFOL;  Surgeon: WoLucilla LameMD;  [facility];  Service: Endoscopy;  Laterality: N/A;  . ESOPHAGOGASTRODUODENOSCOPY (EGD) WITH PROPOFOL N/A 12/08/2016   Procedure: ESOPHAGOGASTRODUODENOSCOPY (EGD) WITH PROPOFOL;  Surgeon: DaLucilla LameMD;  Location: [facility] ENDOSCOPY;  Service: Endoscopy;  Laterality: N/A;  . IVC FILTER INSERTION N/A 02/15/2017   Procedure: IVC Filter Insertion;  Surgeon: DeAlgernon HuxleyMD;  [facility];  Service: Cardiovascular;  Laterality: N/A;  . PORTACATH PLACEMENT Right 12-31-14   Dr ByBary Castilla  FAMILY HISTORY :   Family History  Problem Relation Age of  Onset  . Breast cancer Maternal Aunt   . Breast cancer Cousin   . Brain cancer Maternal Uncle   . Diabetes Mother   . Hypertension Mother   . Stroke Mother     SOCIAL HISTORY:   Social History   Tobacco Use  . Smoking status: Light Tobacco Smoker    Packs/day: 0.50    Years: 18.00    Pack years: 9.00    Types: Cigarettes  . Smokeless tobacco: Never Used  Substance Use Topics  . Alcohol use: No    Alcohol/week: 0.0 standard drinks  . Drug use: No    ALLERGIES:  is allergic to no known allergies.  MEDICATIONS:  Current Outpatient Medications  Medication Sig Dispense Refill  . dexamethasone (DECADRON) 2 MG tablet Take 1 pill BID [morning and afternoon] 45 tablet 0  . flecainide (TAMBOCOR) 50 MG tablet Take 1 tablet (50 mg total) by mouth 2 (two) times daily. 60 tablet 4  . fluconazole (DIFLUCAN) 100 MG tablet Take 1 tablet (100 mg total) by mouth daily. 14 tablet 0  . ibuprofen (ADVIL) 400 MG tablet Take 1 tablet (400 mg total) by mouth 3 (three) times daily. 30 tablet 0  .  ipratropium (ATROVENT) 0.03 % nasal spray Place 2 sprays into both nostrils every 12 (twelve) hours. 30 mL 12  . magnesium oxide (MAG-OX) 400 MG tablet Take 1 tablet (400 mg total) by mouth 2 (two) times daily. 60 tablet 5  . meclizine (ANTIVERT) 25 MG tablet Take 1 tablet (25 mg total) by mouth 3 (three) times daily as needed for dizziness. (Patient not taking: Reported on 09/27/2019) 30 tablet 0  . metoprolol tartrate (LOPRESSOR) 25 MG tablet Take 1 tablet (25 mg total) by mouth 2 (two) times daily. 60 tablet 1  . montelukast (SINGULAIR) 10 MG tablet Take 1 tablet (10 mg total) by mouth at bedtime. 60 tablet 3  . pregabalin (LYRICA) 75 MG capsule TAKE 1 CAPSULE BY MOUTH TWO TIMES DAILY 60 capsule 2  . Vitamin D, Ergocalciferol, (DRISDOL) 1.25 MG (50000 UNIT) CAPS capsule TAKE 1 CAPSULE (50,000 UNITS TOTAL) ONCE A WEEK BY MOUTH. 12 capsule 1   No current facility-administered medications for this visit.    Facility-Administered Medications Ordered in Other Visits  Medication Dose Route Frequency Provider Last Rate Last Admin  . 0.9 %  sodium chloride infusion   Intravenous Continuous [name], MD   New Bag at 12/10/16 1140  . 0.9 %  sodium chloride infusion   Intravenous Continuous Lloyd Huger, MD 999 mL/hr at 04/24/15 1520 New Bag at 05/23/15 0951  . heparin lock flush 100 unit/mL  500 Units Intravenous Once Berenzon, Dmitriy, MD      . sodium chloride 0.9 % injection 10 mL  10 mL Intravenous PRN [name], MD   10 mL at 04/04/15 1440  . sodium chloride flush (NS) 0.9 % injection 10 mL  10 mL Intravenous PRN Berenzon, Dmitriy, MD        PHYSICAL EXAMINATION: ECOG PERFORMANCE STATUS: 1 - Symptomatic but completely ambulatory  BP (!) 141/103 (BP Location: Right Arm, Patient Position: Sitting, Cuff Size: Normal)   Pulse (!) 126 Comment: rechecked by MD  Temp 98.8 F (37.1 C) (Tympanic)   Wt 160 lb (72.6 kg)   BMI 25.82 kg/m   Filed Weights   10/12/19 1500  Weight: 160 lb (72.6 kg)    Physical Exam  Constitutional: She is oriented to person, place, and time and well-developed, well-nourished, and in no distress.  Alone.  Walking herself.  HENT:  Head: Normocephalic and atraumatic.  Mouth/Throat: Oropharynx is clear and moist. No oropharyngeal exudate.  Oral thrush.   Eyes: Pupils are equal, round, and reactive to light.  Cardiovascular: Normal rate and regular rhythm.  Pulmonary/Chest: Effort normal and breath sounds normal. No respiratory distress. She has no wheezes.  Abdominal: Soft. Bowel sounds are normal. She exhibits no distension and no mass. There is no abdominal tenderness. There is no rebound and no guarding.  Musculoskeletal:        General: No tenderness or edema. Normal range of motion.     Cervical back: Normal range of motion and neck supple.  Neurological: She is alert and oriented to person, place, and time.  Skin: Skin is warm.   Psychiatric: Affect normal.    LABORATORY DATA:  I have reviewed the data as listed    Component Value Date/Time   NA 139 10/13/2019 0906   NA 135 03/04/2017 0918   NA 135 01/08/2015 0850   K 3.2 (L) 10/13/2019 0906   K 3.7 01/08/2015 0850   CL 100 10/13/2019 0906   CL 101 01/08/2015 0850   CO2 29 10/13/2019 0906  CO2 26 01/08/2015 0850   GLUCOSE 96 10/13/2019 0906   GLUCOSE 161 (H) 01/08/2015 0850   BUN 10 10/13/2019 0906   BUN 8 03/04/2017 0918   BUN 17 01/08/2015 0850   CREATININE 0.74 10/13/2019 0906   CREATININE 0.82 01/22/2015 1559   CALCIUM 8.9 10/13/2019 0906   CALCIUM 9.3 01/08/2015 0850   PROT 7.3 10/13/2019 0906   PROT 6.4 03/04/2017 0918   PROT 6.8 01/22/2015 1559   ALBUMIN 3.3 (L) 10/13/2019 0906   ALBUMIN 2.5 (L) 03/04/2017 0918   ALBUMIN 3.9 01/22/2015 1559   AST 111 (H) 10/13/2019 0906   AST 34 01/22/2015 1559   ALT 109 (H) 10/13/2019 0906   ALT 42 01/22/2015 1559   ALKPHOS 326 (H) 10/13/2019 0906   ALKPHOS 157 (H) 01/22/2015 1559   BILITOT 1.3 (H) 10/13/2019 0906   BILITOT 6.2 (H) 03/04/2017 0918   BILITOT 0.2 (L) 01/22/2015 1559   GFRNONAA >60 10/13/2019 0906   GFRNONAA >60 01/22/2015 1559   GFRAA >60 10/13/2019 0906   GFRAA >60 01/22/2015 1559    No results found for: SPEP, UPEP  Lab Results  Component Value Date   WBC 5.2 10/13/2019   NEUTROABS 3.7 10/13/2019   HGB 13.2 10/13/2019   HCT 41.1 10/13/2019   MCV 94.9 10/13/2019   PLT 301 10/13/2019      Chemistry      Component Value Date/Time   NA 139 10/13/2019 0906   NA 135 03/04/2017 0918   NA 135 01/08/2015 0850   K 3.2 (L) 10/13/2019 0906   K 3.7 01/08/2015 0850   CL 100 10/13/2019 0906   CL 101 01/08/2015 0850   CO2 29 10/13/2019 0906   CO2 26 01/08/2015 0850   BUN 10 10/13/2019 0906   BUN 8 03/04/2017 0918   BUN 17 01/08/2015 0850   CREATININE 0.74 10/13/2019 0906   CREATININE 0.82 01/22/2015 1559      Component Value Date/Time   CALCIUM 8.9 10/13/2019 0906    CALCIUM 9.3 01/08/2015 0850   ALKPHOS 326 (H) 10/13/2019 0906   ALKPHOS 157 (H) 01/22/2015 1559   AST 111 (H) 10/13/2019 0906   AST 34 01/22/2015 1559   ALT 109 (H) 10/13/2019 0906   ALT 42 01/22/2015 1559   BILITOT 1.3 (H) 10/13/2019 0906   BILITOT 6.2 (H) 03/04/2017 0918   BILITOT 0.2 (L) 01/22/2015 1559       RADIOGRAPHIC STUDIES: I have personally reviewed the radiological images as listed and agreed with the findings in the report. No results found.   ASSESSMENT & PLAN:  Carcinoma of upper-outer quadrant of left breast in female, estrogen receptor positive ([place]) # Recurrent metastatic breast cancer-ER PR positive HER-2 negative; NOV 2020- PROGRESSION in liver- multiple enhancing lesions noted.  Most recently on Taxol weekly chemo; worsening.  #Proceed with Adriamycin weekly dose cycle #1 today. Labs today reviewed;  acceptable for treatment today-however LFTs elevated  #Sinus tachycardia 120s to 130s; recent EKG-reviewed sinus tachycardia.  Recommend adding metoprolol 25 mg twice daily.  50 mg metoprolol prior to treatment today.  # Oral thrush-s/p Diflucan-resolved.  # Elevated LFTs-likely secondary to progressive malignancy.  Monitor closely.  # Brain metastases; Recurrent-s/p RT finshed 11/17.  MRI- improved; STOP dexamethasone today.  Stable.  # Peripheral neuropathy grade 1-2-  on Lyrica; stable.  #Debility/generalized weakness-questions for steroid myopathy-shower chair.  #Prognosis-discussed with the patient regarding the overall poor prognosis; and high risk of progressive disease/poor response to chemotherapy/and the risk of side effects.  Wants to proceed with treatment knowing risk of serious adverse events.  # DISPOSITION:  # adriamycin today # 1 week- Josh # 1 week- MD; labs- cbc/cmp; ; adriamycin weekly- Dr.B   No orders of the defined types were placed in this encounter.  All questions were answered. The patient knows to call the clinic with any  problems, questions or concerns.     Cammie Sickle, MD 10/16/2019 7:43 AM

## 2019-10-17 ENCOUNTER — Telehealth: Payer: Self-pay | Admitting: *Deleted

## 2019-10-17 DIAGNOSIS — G620 Drug-induced polyneuropathy: Secondary | ICD-10-CM | POA: Diagnosis not present

## 2019-10-17 DIAGNOSIS — I10 Essential (primary) hypertension: Secondary | ICD-10-CM | POA: Diagnosis not present

## 2019-10-17 DIAGNOSIS — C182 Malignant neoplasm of ascending colon: Secondary | ICD-10-CM | POA: Diagnosis not present

## 2019-10-17 DIAGNOSIS — C50412 Malignant neoplasm of upper-outer quadrant of left female breast: Secondary | ICD-10-CM | POA: Diagnosis not present

## 2019-10-17 DIAGNOSIS — C184 Malignant neoplasm of transverse colon: Secondary | ICD-10-CM | POA: Diagnosis not present

## 2019-10-17 DIAGNOSIS — D702 Other drug-induced agranulocytosis: Secondary | ICD-10-CM | POA: Diagnosis not present

## 2019-10-17 DIAGNOSIS — T451X5D Adverse effect of antineoplastic and immunosuppressive drugs, subsequent encounter: Secondary | ICD-10-CM | POA: Diagnosis not present

## 2019-10-17 DIAGNOSIS — C7931 Secondary malignant neoplasm of brain: Secondary | ICD-10-CM | POA: Diagnosis not present

## 2019-10-17 DIAGNOSIS — C787 Secondary malignant neoplasm of liver and intrahepatic bile duct: Secondary | ICD-10-CM | POA: Diagnosis not present

## 2019-10-17 NOTE — Telephone Encounter (Signed)
10/10/19- msg sent to Brad with [facility] on 1/15 to discuss patient's status for shower chair delivery as pt stated that "no one had contacted her from [employer] re: the shower chair."  per Leroy Sea " I have left a number of messages for this patient about private pay cost and needing her to call me back with no return calls. I also spoke with her while she was in the hospital and she was provided a 3-N-1 Commode that can also be used in the shower since her insurance doesnt cover shower chairs. If you speak to her let her know the private pay amount is $45.00 and can be shipped to her home or she can pick up at [facility] from 9am-5pm Monday-Friday"  When I spoke to the patient at her apt, she denies having this conversation with [employer]. However, I did provider her with the phone number to discuss her DME need with [place].  Pt voiced that she was "worried that the shower chair would not fit in her bathtube." She stated that she "did not want to bring her [facility] into the shower."   received msg from [place] from [place] on 1/15 that he spoke with patient. pt has decided now that she would use the [facility] for now.  patient understands that she can call [place] [facility] if she would like to go forward with the shower chair.

## 2019-10-19 ENCOUNTER — Other Ambulatory Visit: Payer: Self-pay

## 2019-10-19 DIAGNOSIS — C50412 Malignant neoplasm of upper-outer quadrant of left female breast: Secondary | ICD-10-CM | POA: Diagnosis not present

## 2019-10-19 DIAGNOSIS — C182 Malignant neoplasm of ascending colon: Secondary | ICD-10-CM | POA: Diagnosis not present

## 2019-10-19 DIAGNOSIS — C7931 Secondary malignant neoplasm of brain: Secondary | ICD-10-CM | POA: Diagnosis not present

## 2019-10-19 DIAGNOSIS — I10 Essential (primary) hypertension: Secondary | ICD-10-CM | POA: Diagnosis not present

## 2019-10-19 DIAGNOSIS — C184 Malignant neoplasm of transverse colon: Secondary | ICD-10-CM | POA: Diagnosis not present

## 2019-10-19 DIAGNOSIS — T451X5D Adverse effect of antineoplastic and immunosuppressive drugs, subsequent encounter: Secondary | ICD-10-CM | POA: Diagnosis not present

## 2019-10-19 DIAGNOSIS — C787 Secondary malignant neoplasm of liver and intrahepatic bile duct: Secondary | ICD-10-CM | POA: Diagnosis not present

## 2019-10-19 DIAGNOSIS — D702 Other drug-induced agranulocytosis: Secondary | ICD-10-CM | POA: Diagnosis not present

## 2019-10-19 DIAGNOSIS — G620 Drug-induced polyneuropathy: Secondary | ICD-10-CM | POA: Diagnosis not present

## 2019-10-19 NOTE — Progress Notes (Signed)
Patient pre screened for office appointment, no questions or concerns today. Patient reminded of upcoming appointment time and date. 

## 2019-10-20 ENCOUNTER — Inpatient Hospital Stay: Payer: Medicare HMO

## 2019-10-20 ENCOUNTER — Other Ambulatory Visit: Payer: Self-pay | Admitting: *Deleted

## 2019-10-20 ENCOUNTER — Inpatient Hospital Stay: Payer: Medicare HMO | Admitting: Hospice and Palliative Medicine

## 2019-10-20 ENCOUNTER — Other Ambulatory Visit: Payer: Self-pay

## 2019-10-20 ENCOUNTER — Inpatient Hospital Stay (HOSPITAL_BASED_OUTPATIENT_CLINIC_OR_DEPARTMENT_OTHER): Payer: Medicare HMO | Admitting: Internal Medicine

## 2019-10-20 VITALS — BP 143/95 | HR 93

## 2019-10-20 DIAGNOSIS — R531 Weakness: Secondary | ICD-10-CM | POA: Diagnosis not present

## 2019-10-20 DIAGNOSIS — R7989 Other specified abnormal findings of blood chemistry: Secondary | ICD-10-CM | POA: Diagnosis not present

## 2019-10-20 DIAGNOSIS — T451X5A Adverse effect of antineoplastic and immunosuppressive drugs, initial encounter: Secondary | ICD-10-CM | POA: Diagnosis not present

## 2019-10-20 DIAGNOSIS — Z7189 Other specified counseling: Secondary | ICD-10-CM

## 2019-10-20 DIAGNOSIS — C50412 Malignant neoplasm of upper-outer quadrant of left female breast: Secondary | ICD-10-CM

## 2019-10-20 DIAGNOSIS — Z170 Estrogen receptor positive status [ER+]: Secondary | ICD-10-CM

## 2019-10-20 DIAGNOSIS — Z95828 Presence of other vascular implants and grafts: Secondary | ICD-10-CM

## 2019-10-20 DIAGNOSIS — Z515 Encounter for palliative care: Secondary | ICD-10-CM | POA: Diagnosis not present

## 2019-10-20 DIAGNOSIS — C787 Secondary malignant neoplasm of liver and intrahepatic bile duct: Secondary | ICD-10-CM | POA: Diagnosis not present

## 2019-10-20 DIAGNOSIS — Z5111 Encounter for antineoplastic chemotherapy: Secondary | ICD-10-CM | POA: Diagnosis not present

## 2019-10-20 DIAGNOSIS — G620 Drug-induced polyneuropathy: Secondary | ICD-10-CM | POA: Diagnosis not present

## 2019-10-20 DIAGNOSIS — C7931 Secondary malignant neoplasm of brain: Secondary | ICD-10-CM | POA: Diagnosis not present

## 2019-10-20 LAB — COMPREHENSIVE METABOLIC PANEL
ALT: 102 U/L — ABNORMAL HIGH (ref 0–44)
AST: 90 U/L — ABNORMAL HIGH (ref 15–41)
Albumin: 3.3 g/dL — ABNORMAL LOW (ref 3.5–5.0)
Alkaline Phosphatase: 316 U/L — ABNORMAL HIGH (ref 38–126)
Anion gap: 9 (ref 5–15)
BUN: 12 mg/dL (ref 8–23)
CO2: 26 mmol/L (ref 22–32)
Calcium: 8.9 mg/dL (ref 8.9–10.3)
Chloride: 102 mmol/L (ref 98–111)
Creatinine, Ser: 0.78 mg/dL (ref 0.44–1.00)
GFR calc Af Amer: >60 mL/min (ref >60)
GFR calc non Af Amer: >60 mL/min (ref >60)
Glucose, Bld: 119 mg/dL — ABNORMAL HIGH (ref 70–99)
Potassium: 3.7 mmol/L (ref 3.5–5.1)
Sodium: 137 mmol/L (ref 135–145)
Total Bilirubin: 1.1 mg/dL (ref 0.3–1.2)
Total Protein: 6.8 g/dL (ref 6.5–8.1)

## 2019-10-20 LAB — CBC WITH DIFFERENTIAL/PLATELET
Abs Immature Granulocytes: 0.01 10*3/uL (ref 0.00–0.07)
Basophils Absolute: 0 10*3/uL (ref 0.0–0.1)
Basophils Relative: 0 %
Eosinophils Absolute: 0 10*3/uL (ref 0.0–0.5)
Eosinophils Relative: 1 %
HCT: 38 % (ref 36.0–46.0)
Hemoglobin: 12.2 g/dL (ref 12.0–15.0)
Immature Granulocytes: 0 %
Lymphocytes Relative: 16 %
Lymphs Abs: 0.7 10*3/uL (ref 0.7–4.0)
MCH: 30.9 pg (ref 26.0–34.0)
MCHC: 32.1 g/dL (ref 30.0–36.0)
MCV: 96.2 fL (ref 80.0–100.0)
Monocytes Absolute: 0.1 10*3/uL (ref 0.1–1.0)
Monocytes Relative: 2 %
Neutro Abs: 3.3 10*3/uL (ref 1.7–7.7)
Neutrophils Relative %: 81 %
Platelets: 232 10*3/uL (ref 150–400)
RBC: 3.95 MIL/uL (ref 3.87–5.11)
RDW: 18.2 % — ABNORMAL HIGH (ref 11.5–15.5)
WBC: 4.1 10*3/uL (ref 4.0–10.5)
nRBC: 0 % (ref 0.0–0.2)

## 2019-10-20 MED ORDER — SODIUM CHLORIDE 0.9 % IV SOLN
10.0000 mg | Freq: Once | INTRAVENOUS | Status: AC
  Administered 2019-10-20: 10 mg via INTRAVENOUS
  Filled 2019-10-20: qty 10

## 2019-10-20 MED ORDER — SODIUM CHLORIDE 0.9 % IV SOLN
Freq: Once | INTRAVENOUS | Status: AC
  Filled 2019-10-20: qty 250

## 2019-10-20 MED ORDER — HEPARIN SOD (PORK) LOCK FLUSH 100 UNIT/ML IV SOLN
INTRAVENOUS | Status: AC
  Filled 2019-10-20: qty 5

## 2019-10-20 MED ORDER — PALONOSETRON HCL INJECTION 0.25 MG/5ML
0.2500 mg | Freq: Once | INTRAVENOUS | Status: AC
  Administered 2019-10-20: 0.25 mg via INTRAVENOUS
  Filled 2019-10-20: qty 5

## 2019-10-20 MED ORDER — DOXORUBICIN HCL CHEMO IV INJECTION 2 MG/ML
20.0000 mg/m2 | Freq: Once | INTRAVENOUS | Status: AC
  Administered 2019-10-20: 38 mg via INTRAVENOUS
  Filled 2019-10-20: qty 19

## 2019-10-20 MED ORDER — HEPARIN SOD (PORK) LOCK FLUSH 100 UNIT/ML IV SOLN
500.0000 [IU] | Freq: Once | INTRAVENOUS | Status: AC | PRN
  Administered 2019-10-20: 500 [IU]
  Filled 2019-10-20: qty 5

## 2019-10-20 MED ORDER — SODIUM CHLORIDE 0.9% FLUSH
10.0000 mL | Freq: Once | INTRAVENOUS | Status: AC
  Administered 2019-10-20: 10 mL via INTRAVENOUS
  Filled 2019-10-20: qty 10

## 2019-10-20 NOTE — Assessment & Plan Note (Addendum)
#  Recurrent metastatic breast cancer-ER PR positive HER-2 negative; NOV 2020- PROGRESSION in liver- multiple enhancing lesions noted.  On ADRIAMYCIN weekly.  # Proceed with Adriamycin weekly dose cycle #2 today. Labs today reviewed;  acceptable for treatment today-however LFTs elevated.   # Sinus tachycardia- on metoprolol-/Tamabcor- STABLE.   # Elevated LFTs-likely secondary to progressive malignancy; STABLE;  monitor closely.   # Brain metastases; Recurrent-s/p RT finshed 11/17.  MRI- improved; STOP dexamethasone today. STABLE.   # Peripheral neuropathy grade 1-2-  on Lyrica; stable  # Bil LE swelling- recommend compression stockings.  # Palliative care evaluation today- discussed with Josh.   # DISPOSITION:  # Adriamycin today # 1 week- MD; labs- cbc/cmp; ; adriamycin weekly- Dr.B

## 2019-10-20 NOTE — Progress Notes (Signed)
[place]  [phone] [phone]   Name: Sarah Horne Date: 10/20/2019 MRN: [id number]  DOB: 05/03/1956  Patient Care Team: Arnetha Courser, MD as PCP - General (Family Medicine) Wellington Hampshire, MD as PCP - Cardiology (Cardiology) Byrnett, Forest Gleason, MD (General Surgery) [name], MD (Inactive) as Attending Physician (Internal Medicine)    REASON FOR CONSULTATION: Sarah Horne is a 64 y.o. female with multiple medical problems including stage IV breast cancer with leptomeningeal and liver metastases who has had disease progression on multiple previous lines of treatment. MRI of the brain on 05/06/2019 showed a several new metastatic lesions. MRI of the brain on 07/06/2019 also showed evidence of disease progression with enlarging existing lesions and note of interval new metastatic lesions. Patient was most recently rotated Adriamycin with some evidence of disease improvement.  Patient was referred to palliative care to help address goals and manage ongoing symptoms..   SOCIAL HISTORY:     reports that she has been smoking cigarettes. She has a 9.00 pack-year smoking history. She has never used smokeless tobacco. She reports that she does not drink alcohol or use drugs.  Patient is not married.  She lives at home alone.  She has a son and daughter who live about 30 minutes away.  She has a brother who is involved in her care.  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  ADVANCE DIRECTIVES:  HC POA and living will on file dated 12/12/2016.  Patient's brother is her healthcare power of attorney.  CODE STATUS: DNR/DNI (MOST form completed 07/13/2019)  PAST MEDICAL HISTORY: Past Medical History:  Diagnosis Date  . Chemotherapy-induced peripheral neuropathy ([place])   . Epistaxis    a. 11/2016 in setting of asa/plavix-->silver nitrate cauterization.  . GI bleed    a. 11/2016 Admission w/ GIB and  hypovolemic shock req 3u PRBC's;  b. 11/2016 ECG: gastritis & nonbleeding peptic ulcer; c. 11/2016 Conlonoscopy: rectal and sigmoid colonic ulcers.  . Heart attack ([place])    a. 1998 Cath @ [facility]: reportedly no intervention required.  Marland Kitchen Herceptin-induced cardiomyopathy ([place])    a. In the setting of Herceptin Rx for breast cancer (initiated 12/2014); b. 03/2015 MUGA EF 64%; b. 08/2015 MUGA: EF 51%; c. 10/2015 MUGA: EF 44%; d. 11/2015 Echo: EF 45-50%; e. 01/2016 MUGA: EF 60%; f. 06/2016 MUGA EF 65%; g. 10/2016 MUGA: EF 61%;  h. 12/2016 Echo: EF 55-60%, gr1 DD.  Marland Kitchen Hyperlipidemia   . Hypertension   . Neuropathy   . Possible PAD (peripheral artery disease) ([place])    a. 11/2016 LE cyanosis and weak pulses-->CTA w/o significant Ao-BiFem dzs. ? distal dzs-->ASA/Plavix initiated by vascular surgery.  Marland Kitchen PSVT (paroxysmal supraventricular tachycardia) ([place])    a. Dx 11/2016.  . Pulmonary embolism ([place])    a. 12/2016 CTA Chest: small nonocclusive PE in inferior segment of the Left lingula, somewhat eccentric filling defect suggesting chronic rather than acute embolic event; b. 10/252 LE U/S:  No DVT; c. 12/2016 Echo: Nl RV fxn, nl PASP.  Marland Kitchen Recurrent Metastatic breast cancer ([place])    a. Dx 2016: Stage II, ER positive, PR positive, HER-2/neu overexpressing of the left breast-->chemo/radiation; b. 10/2016 CT Abd/pelvis: diffuse liver mets, ill defined sclerotic bone lesions-T12;  c. 10/2016 MRI brain: metastatic lesion along L temporal lobe (19x54m) w/ extensive surrounding edema & 578mmidline shift to right.  . Sinus tachycardia     PAST SURGICAL HISTORY:  Past Surgical History:  Procedure Laterality Date  . BREAST BIOPSY Left 2016   Positive  . BREAST LUMPECTOMY WITH SENTINEL LYMPH NODE BIOPSY Left 05/23/2015   Procedure: LEFT BREAST WIDE EXCISION WITH AXILLARY DISSECTION, MASTOPLASTY ;  Surgeon: [name], MD;  Location: [facility] ORS;  Service: General;  Laterality: Left;  . BREAST SURGERY Left 12/18/14   breast  biopsy/INVASIVE DUCTAL CARCINOMA OF BREAST, NOTTINGHAM GRADE 2.  Marland Kitchen BREAST SURGERY  05/23/2015.   Wide excision/mastoplasty, axillary dissection. No residual invasive cancer, positive for residual DCIS. 0/2 nodes identified on axillary dissection. (no SLN by technetium or methylene blue)  . CARDIAC CATHETERIZATION    . COLONOSCOPY WITH PROPOFOL N/A 12/10/2016   Procedure: COLONOSCOPY WITH PROPOFOL;  Surgeon: Lucilla Lame, MD;  Location: [facility] ENDOSCOPY;  Service: Endoscopy;  Laterality: N/A;  . COLONOSCOPY WITH PROPOFOL N/A 02/13/2017   Procedure: COLONOSCOPY WITH PROPOFOL;  Surgeon: Lucilla Lame, MD;  [facility] ENDOSCOPY;  Service: Endoscopy;  Laterality: N/A;  . ESOPHAGOGASTRODUODENOSCOPY (EGD) WITH PROPOFOL N/A 12/08/2016   Procedure: ESOPHAGOGASTRODUODENOSCOPY (EGD) WITH PROPOFOL;  Surgeon: Lucilla Lame, MD;  Location: [facility] ENDOSCOPY;  Service: Endoscopy;  Laterality: N/A;  . IVC FILTER INSERTION N/A 02/15/2017   Procedure: IVC Filter Insertion;  Surgeon: Algernon Huxley, MD;  Location: [place] CV LAB;  Service: Cardiovascular;  Laterality: N/A;  . PORTACATH PLACEMENT Right 12-31-14   Dr Bary Castilla    HEMATOLOGY/ONCOLOGY HISTORY:  Oncology History Overview Note  # LEFT BREAST IDC; STAGE II [cT2N1] ER >90%; PR- 50-90%; her 2 NEU- POS; s/p Neoadj chemo; AUG 2016- TCH+P s/p Lumpec & partial ALND- path CR; s/p RT [finished Nov 2016];  adj Herceptin; HELD for Jan 19th 2017 [in DEC 2016-EF dropped from 63 to 51%; FEB 27th EF-42.9%]; JAN 2016 START Arimidex; May 2017- EF- 60%; May 24th 2017-Re-start Herceptin q 3W; July 28th STOP herceptin [finished 70m/m2; sec to Low EF; however 2017 Oct EF= 67%; improved]  # DEC 4th 2017- Start Neratinib 4 pills; DEC 11th 3 pills; STOPPED.  # FEB 2018- RECURRENCE ER/PR positive; Her 2 NEGATIVE [liver Bx]  # MARCH 1st 2018- Tax-Cytoxan [poor tol]  # FEB 2018- Brain mets [SBRT; Dr.Crystal; finished Feb 28th 2018]  # March 2018- Eribulin- poor tolerance  # June  8th 2018- Started Faslodex + Abema [abema-multiple interruptions sec to neutropenia]  # MRI Brain- March 2019- Leptomeningeal mets [WBRT; No LP s/p WBRT- 01/09/2018]; STOP Abema [580mday-sec to severe cytopenia]+faslodex  # MAY 20tth 019- Start Xeloda; STOPPED in Nov 22nd 2019- sec to Progressive liver lesions on CT scan  # NOV 26th 2019- Faslodex + Piqaray 25019m; Jan 2020- Piqray 300 mg+ faslodex; FEB 17th CT scan- mixed response/ Overall progression; STOP Piqray + faslodex.   # FEB 25th 2020- ERIBULIN; Poor tolerance sec to severe neutropenia.  Stopped May 2020.  CT scan May 2020-new/progressive 2 cm liver lesion;   #July nd 2020-Ixempra x5 cycles-; NOV 2020-MRI liver- worsening liver lesions [rising AST/ALT].   # NOV [id number] weekly [consent-]; December 31-2020-progression/rising LFTs.  Stop Taxol  #January 15th, 2021-Adriamycin weekly [C]  #May 06, 2019-MRI brain 5 new lesions [3 mm to 10 mm]-asymptomatic; [previous whole brain radiation]- OCT 2020- Re-Irradiation.   # PN- G-2; may 2017- Cymbalta 81m47mMARCH 2018-  acute vascular insuff of BIL LE [? Taxotere vasoconstriction on asprin]; # hemorragic shock LGIB [s/p colo; Dr.Wohl]  #  SVT- on flecainide.   # April 2016- Liver Bx- NEG;   # Drop in EF from Herceptin [recovered  OCt 27th- EF 65%]  # BMD- jan 2017- osteopenia  # 2020- SEP-s/p Pallaitive care evaluation  -----------------------------------------------------------     [facility]- Sep 4th 2018- PDL-1 0%/NEG for BRCA; MSI-Stable; Positive for PI3K; ccnd-1; FGF** -----------------------------------------------------------  Dx: Metastatic Breast cancer [ER/PR-Pos; her -2 NEG] Stage IV; goals: palliative Current treatment-Adriamycin [C]   Carcinoma of upper-outer quadrant of left breast in female, estrogen receptor positive ([place])  11/22/2018 - 01/17/2019 Chemotherapy   The patient had eriBULin mesylate (HALAVEN) 2.1 mg in sodium  chloride 0.9 % 100 mL chemo infusion, 1.2 mg/m2 = 2.1 mg (100 % of original dose 1.2 mg/m2), Intravenous,  Once, 3 of 4 cycles Dose modification: 1.2 mg/m2 (original dose 1.2 mg/m2, Cycle 1, Reason: Provider Judgment), 0.8 mg/m2 (original dose 1.2 mg/m2, Cycle 3, Reason: Provider Judgment) Administration: 2.1 mg (11/22/2018), 2.1 mg (11/29/2018), 2.1 mg (12/20/2018), 1.4 mg (01/17/2019)  for chemotherapy treatment.    03/30/2019 - 07/12/2019 Chemotherapy   The patient had pegfilgrastim (NEULASTA ONPRO KIT) injection 6 mg, 6 mg, Subcutaneous, Once, 5 of 8 cycles Administration: 6 mg (03/30/2019), 6 mg (04/20/2019), 6 mg (05/11/2019), 6 mg (06/22/2019), 6 mg (06/01/2019) ixabepilone (IXEMPRA) 60 mg in lactated ringers 250 mL chemo infusion, 33.5 mg/m2 = 57 mg (100 % of original dose 32 mg/m2), Intravenous,  Once, 5 of 8 cycles Dose modification: 32 mg/m2 (original dose 32 mg/m2, Cycle 1, Reason: Provider Judgment) Administration: 72 mg (04/20/2019), 72 mg (05/11/2019), 72 mg (06/22/2019), 72 mg (06/01/2019)  for chemotherapy treatment.    08/18/2019 - 09/28/2019 Chemotherapy   The patient had PACLitaxel (TAXOL) 150 mg in sodium chloride 0.9 % 250 mL chemo infusion (</= 62m/m2), 80 mg/m2 = 150 mg, Intravenous,  Once, 1 of 4 cycles Administration: 150 mg (08/18/2019), 150 mg (09/08/2019), 150 mg (09/15/2019)  for chemotherapy treatment.    10/13/2019 -  Chemotherapy   The patient had DOXOrubicin (ADRIAMYCIN) chemo injection 38 mg, 20 mg/m2 = 38 mg, Intravenous,  Once, 1 of 7 cycles Administration: 38 mg (10/13/2019) palonosetron (ALOXI) injection 0.25 mg, 0.25 mg, Intravenous,  Once, 1 of 7 cycles Administration: 0.25 mg (10/13/2019)  for chemotherapy treatment.      ALLERGIES:  is allergic to no known allergies.  MEDICATIONS:  Current Outpatient Medications  Medication Sig Dispense Refill  . dexamethasone (DECADRON) 2 MG tablet Take 1 pill BID [morning and afternoon] 45 tablet 0  . flecainide (TAMBOCOR) 50  MG tablet Take 1 tablet (50 mg total) by mouth 2 (two) times daily. 60 tablet 4  . fluconazole (DIFLUCAN) 100 MG tablet Take 1 tablet (100 mg total) by mouth daily. 14 tablet 0  . ibuprofen (ADVIL) 400 MG tablet Take 1 tablet (400 mg total) by mouth 3 (three) times daily. 30 tablet 0  . ipratropium (ATROVENT) 0.03 % nasal spray Place 2 sprays into both nostrils every 12 (twelve) hours. 30 mL 12  . magnesium oxide (MAG-OX) 400 MG tablet Take 1 tablet (400 mg total) by mouth 2 (two) times daily. 60 tablet 5  . meclizine (ANTIVERT) 25 MG tablet Take 1 tablet (25 mg total) by mouth 3 (three) times daily as needed for dizziness. 30 tablet 0  . metoprolol tartrate (LOPRESSOR) 25 MG tablet Take 1 tablet (25 mg total) by mouth 2 (two) times daily. 60 tablet 1  . montelukast (SINGULAIR) 10 MG tablet Take 1 tablet (10 mg total) by mouth at bedtime. 60 tablet 3  . pregabalin (LYRICA) 75 MG capsule TAKE 1 CAPSULE BY MOUTH TWO  TIMES DAILY 60 capsule 2  . Vitamin D, Ergocalciferol, (DRISDOL) 1.25 MG (50000 UNIT) CAPS capsule TAKE 1 CAPSULE (50,000 UNITS TOTAL) ONCE A WEEK BY MOUTH. 12 capsule 1   No current facility-administered medications for this visit.   Facility-Administered Medications Ordered in Other Visits  Medication Dose Route Frequency Provider Last Rate Last Admin  . 0.9 %  sodium chloride infusion   Intravenous Continuous [name], MD   New Bag at 12/10/16 1140  . 0.9 %  sodium chloride infusion   Intravenous Continuous Lloyd Huger, MD 999 mL/hr at 04/24/15 1520 New Bag at 05/23/15 0951  . heparin lock flush 100 unit/mL  500 Units Intravenous Once Berenzon, Dmitriy, MD      . sodium chloride 0.9 % injection 10 mL  10 mL Intravenous PRN [name], MD   10 mL at 04/04/15 1440  . sodium chloride flush (NS) 0.9 % injection 10 mL  10 mL Intravenous PRN Berenzon, Dmitriy, MD        VITAL SIGNS: There were no vitals taken for this visit. There were no vitals filed for this  visit.  Estimated body mass index is 26.47 kg/m as calculated from the following:   Height as of 10/04/19: 5' 6"  (1.676 m).   Weight as of an earlier encounter on 10/20/19: 164 lb (74.4 kg).  LABS: CBC:    Component Value Date/Time   WBC 4.1 10/20/2019 0902   HGB 12.2 10/20/2019 0902   HGB 10.2 (L) 03/04/2017 0918   HCT 38.0 10/20/2019 0902   HCT 33.3 (L) 03/04/2017 0918   PLT 232 10/20/2019 0902   PLT 225 03/04/2017 0918   MCV 96.2 10/20/2019 0902   MCV 89 03/04/2017 0918   MCV 90 01/22/2015 1559   NEUTROABS 3.3 10/20/2019 0902   NEUTROABS 3.1 03/04/2017 0918   NEUTROABS 34.8 (H) 01/22/2015 1559   LYMPHSABS 0.7 10/20/2019 0902   LYMPHSABS 0.7 03/04/2017 0918   LYMPHSABS 3.4 01/22/2015 1559   MONOABS 0.1 10/20/2019 0902   MONOABS 1.0 (H) 01/22/2015 1559   EOSABS 0.0 10/20/2019 0902   EOSABS 0.1 03/04/2017 0918   EOSABS 0.0 01/22/2015 1559   BASOSABS 0.0 10/20/2019 0902   BASOSABS 0.0 03/04/2017 0918   BASOSABS 0.0 01/22/2015 1559   Comprehensive Metabolic Panel:    Component Value Date/Time   NA 137 10/20/2019 0902   NA 135 03/04/2017 0918   NA 135 01/08/2015 0850   K 3.7 10/20/2019 0902   K 3.7 01/08/2015 0850   CL 102 10/20/2019 0902   CL 101 01/08/2015 0850   CO2 26 10/20/2019 0902   CO2 26 01/08/2015 0850   BUN 12 10/20/2019 0902   BUN 8 03/04/2017 0918   BUN 17 01/08/2015 0850   CREATININE 0.78 10/20/2019 0902   CREATININE 0.82 01/22/2015 1559   GLUCOSE 119 (H) 10/20/2019 0902   GLUCOSE 161 (H) 01/08/2015 0850   CALCIUM 8.9 10/20/2019 0902   CALCIUM 9.3 01/08/2015 0850   AST 90 (H) 10/20/2019 0902   AST 34 01/22/2015 1559   ALT 102 (H) 10/20/2019 0902   ALT 42 01/22/2015 1559   ALKPHOS 316 (H) 10/20/2019 0902   ALKPHOS 157 (H) 01/22/2015 1559   BILITOT 1.1 10/20/2019 0902   BILITOT 6.2 (H) 03/04/2017 0918   BILITOT 0.2 (L) 01/22/2015 1559   PROT 6.8 10/20/2019 0902   PROT 6.4 03/04/2017 0918   PROT 6.8 01/22/2015 1559   ALBUMIN 3.3 (L) 10/20/2019  0902   ALBUMIN 2.5 (L)  03/04/2017 0918   ALBUMIN 3.9 01/22/2015 1559    RADIOGRAPHIC STUDIES: DG Chest 1 View  Result Date: 10/05/2019 CLINICAL DATA:  Assess if port is power injectable EXAM: CHEST  1 VIEW COMPARISON:  Radiograph 10/04/2019, CT Feb 03, 2019, radiographs 06/04/2017, 01/12/2017 FINDINGS: There is a right subclavian approach Port-A-Cath with the tip positioned at the superior cavoatrial junction. This port is currently accessed by [facility] needle. Notably, the typical labeling of the port to denote whether or not this device is power injectable is not well visualized on this exam though was present on CT images from Feb 03, 2019 and more remote radiographic comparison from 01/12/2017. Overall configuration of the device itself including positioning of the port pocket, catheter curvature and tip position appear grossly unchanged. No consolidation, features of edema, pneumothorax, or effusion. The cardiomediastinal contours are unremarkable. No acute osseous or soft tissue abnormality. IMPRESSION: 1. Port-A-Cath with the tip positioned at the superior cavoatrial junction. This port port is currently accessed by [facility] needle. 2. Port labeling to denote whether not this devices power injectable is not well visualized on radiography but was present on comparison CT 02/03/2019 and radiograph 01/12/2017. Device appears grossly unchanged though should correlate in the patient's chart if there has been intervention upon the port since May of 2020. 3. No other acute cardiopulmonary findings. These results were called by telephone at the time of interpretation on 10/05/2019 at 6:15 am to provider [facility] , who verbally acknowledged these results. Electronically Signed   By: Lovena Le M.D.   On: 10/05/2019 06:15   CT Head Wo Contrast  Result Date: 10/05/2019 CLINICAL DATA:  Fall EXAM: CT HEAD WITHOUT CONTRAST CT CERVICAL SPINE WITHOUT CONTRAST TECHNIQUE: Multidetector CT imaging of the head and cervical  spine was performed following the standard protocol without intravenous contrast. Multiplanar CT image reconstructions of the cervical spine were also generated. COMPARISON:  None. FINDINGS: CT HEAD FINDINGS Brain: There is no mass, hemorrhage or extra-axial collection. The size and configuration of the ventricles and extra-axial CSF spaces are normal. There is diffuse severe white matter hypoattenuation, possibly a sequela of radiation therapy. Vascular: No abnormal hyperdensity of the major intracranial arteries or dural venous sinuses. No intracranial atherosclerosis. Skull: The visualized skull base, calvarium and extracranial soft tissues are normal. Sinuses/Orbits: No fluid levels or advanced mucosal thickening of the visualized paranasal sinuses. No mastoid or middle ear effusion. The orbits are normal. CT CERVICAL SPINE FINDINGS Alignment: No static subluxation. Facets are aligned. Occipital condyles are normally positioned. Skull base and vertebrae: No acute fracture. Soft tissues and spinal canal: No prevertebral fluid or swelling. No visible canal hematoma. Disc levels: Multilevel facet arthrosis. There is fusion of the right C2 and C3 facets. Upper chest: Large cavitary lesion of the left lung apex, measuring up to 3.5 cm. Other: Normal visualized paraspinal cervical soft tissues. IMPRESSION: 1. Severe chronic white matter disease, likely post radiation changes. No acute intracranial abnormality. 2. No acute fracture or static subluxation of the cervical spine. 3. Large cavitary lesion of the left lung apex, measuring up to 3.5 cm, likely a metastasis. Electronically Signed   By: Ulyses Jarred M.D.   On: 10/05/2019 01:27   CT ANGIO CHEST PE W OR WO CONTRAST  Result Date: 10/05/2019 CLINICAL DATA:  Breast carcinoma.  Tachycardia and pain EXAM: CT ANGIOGRAPHY CHEST WITH CONTRAST TECHNIQUE: Multidetector CT imaging of the chest was performed using the standard protocol during bolus administration of  intravenous contrast. Multiplanar CT image  reconstructions and MIPs were obtained to evaluate the vascular anatomy. CONTRAST:  38m OMNIPAQUE IOHEXOL 350 MG/ML SOLN COMPARISON:  Chest radiograph October 05, 2019; PET-CT June 20, 2019 FINDINGS: Cardiovascular: There is no demonstrable pulmonary embolus. There is no appreciable thoracic aortic aneurysm or dissection. The visualized great vessels appear unremarkable. Right innominate and left common carotid arteries arise as a common trunk, an anatomic variant. There is no pericardial effusion or pericardial thickening. Port-A-Cath tip is in the superior vena cava. Mediastinum/Nodes: Thyroid appears unremarkable. There are several subcentimeter lymph nodes. There is no frank adenopathy by size criteria. There is a small hiatal hernia. Lungs/Pleura: There is a cavitary nodular lesion in the left apex measuring 4.5 x 3.8 cm. There is mild pneumonitis adjacent to this apparent mass. There is mild bibasilar atelectasis. No pleural effusions evident. Upper Abdomen: The liver contour is nodular, raising concern for a degree of underlying hepatic cirrhosis. There is an area of decreased attenuation in the left lobe of the liver measuring 4.7 x 2.5 cm, concerning for potential metastasis. There are ill-defined lesions throughout the right lobe of the liver is well consistent with suspected multifocal metastatic disease. Visualized upper abdominal structures otherwise appear unremarkable. Musculoskeletal: No blastic or lytic bone lesions are demonstrable. Port is noted anteriorly on the right. Postoperative change noted in left breast. Review of the MIP images confirms the above findings. IMPRESSION: 1. No demonstrable pulmonary embolus. No thoracic aortic aneurysm or dissection. 2. Cavitary mass in the left apex region with mild surrounding pneumonitis. This lesion measures 4.5 x 3.8 cm. Neoplastic focus felt to be likely. 3. Liver has a somewhat nodular contour, likely  due to underlying hepatic cirrhosis. Multiple mass lesions are noted throughout the liver, likely metastatic disease. 4.  No adenopathy by size criteria. 5.  Postoperative change left breast. 6.  Small hiatal hernia. Electronically Signed   By: WLowella GripIII M.D.   On: 10/05/2019 07:59   CT Cervical Spine Wo Contrast  Result Date: 10/05/2019 CLINICAL DATA:  Fall EXAM: CT HEAD WITHOUT CONTRAST CT CERVICAL SPINE WITHOUT CONTRAST TECHNIQUE: Multidetector CT imaging of the head and cervical spine was performed following the standard protocol without intravenous contrast. Multiplanar CT image reconstructions of the cervical spine were also generated. COMPARISON:  None. FINDINGS: CT HEAD FINDINGS Brain: There is no mass, hemorrhage or extra-axial collection. The size and configuration of the ventricles and extra-axial CSF spaces are normal. There is diffuse severe white matter hypoattenuation, possibly a sequela of radiation therapy. Vascular: No abnormal hyperdensity of the major intracranial arteries or dural venous sinuses. No intracranial atherosclerosis. Skull: The visualized skull base, calvarium and extracranial soft tissues are normal. Sinuses/Orbits: No fluid levels or advanced mucosal thickening of the visualized paranasal sinuses. No mastoid or middle ear effusion. The orbits are normal. CT CERVICAL SPINE FINDINGS Alignment: No static subluxation. Facets are aligned. Occipital condyles are normally positioned. Skull base and vertebrae: No acute fracture. Soft tissues and spinal canal: No prevertebral fluid or swelling. No visible canal hematoma. Disc levels: Multilevel facet arthrosis. There is fusion of the right C2 and C3 facets. Upper chest: Large cavitary lesion of the left lung apex, measuring up to 3.5 cm. Other: Normal visualized paraspinal cervical soft tissues. IMPRESSION: 1. Severe chronic white matter disease, likely post radiation changes. No acute intracranial abnormality. 2. No acute  fracture or static subluxation of the cervical spine. 3. Large cavitary lesion of the left lung apex, measuring up to 3.5 cm, likely a metastasis. Electronically Signed  By: Ulyses Jarred M.D.   On: 10/05/2019 01:27   DG Chest Port 1 View  Result Date: 10/04/2019 CLINICAL DATA:  Fevers. EXAM: PORTABLE CHEST 1 VIEW COMPARISON:  06/04/2017, 06/20/2019 PET-CT FINDINGS: Cardiac shadows within normal limits. Right chest wall port is again seen and stable. Changes of prior left mastectomy are seen. No acute bony abnormality is seen. No focal infiltrate is noted. IMPRESSION: No acute abnormality noted. Electronically Signed   By: Inez Catalina M.D.   On: 10/04/2019 23:41    PERFORMANCE STATUS (ECOG) : 2 - Symptomatic, <50% confined to bed  Review of Systems Unless otherwise noted, a complete review of systems is negative.  Physical Exam General: NAD, frail appearing, thin Pulmonary: Unlabored Extremities: no edema, no joint deformities Skin: no rashes Neurological: Weakness but otherwise nonfocal  IMPRESSION: Met with patient for follow-up visit today in the clinic.  Patient reports that she is still weak following her recent hospitalization but that she feels like she is slowly improving.  She is working with home health PT.  She denies any recurrent falls at home.  She is ambulatory within the home without use of a walker.  We did again discuss DME needs and I provided her with a prescription for a shower chair.  We also discussed establishing life alert and patient was provided with that information.  Otherwise, patient denies any distressing symptoms today.  She remains in agreement with the current scope of treatment.  Case discussed with Dr. Rogue Bussing.  PLAN: -Continue current scope of treatment -Prescription for shower chair -Recommend life alert -Follow-up telephone visit in 3 to 4 weeks   Patient expressed understanding and was in agreement with this plan. She also understands that  She can call the clinic at any time with any questions, concerns, or complaints.     Time Total: 15 minutes  Visit consisted of counseling and education dealing with the complex and emotionally intense issues of symptom management and palliative care in the setting of serious and potentially life-threatening illness.Greater than 50%  of this time was spent counseling and coordinating care related to the above assessment and plan.  Signed by: Altha Harm, PhD, NP-C

## 2019-10-20 NOTE — Progress Notes (Signed)
[place] OFFICE PROGRESS NOTE  Patient Care Team: Lada, Satira Anis, MD as PCP - General (Family Medicine) Wellington Hampshire, MD as PCP - Cardiology (Cardiology) Byrnett, Forest Gleason, MD (General Surgery) [name], MD (Inactive) as Attending Physician (Internal Medicine)  Cancer Staging No matching staging information was found for the patient.   Oncology History Overview Note  # LEFT BREAST IDC; STAGE II [cT2N1] ER >90%; PR- 50-90%; her 2 NEU- POS; s/p Neoadj chemo; AUG 2016- TCH+P s/p Lumpec & partial ALND- path CR; s/p RT [finished Nov 2016];  adj Herceptin; HELD for Jan 19th 2017 [in DEC 2016-EF dropped from 63 to 51%; FEB 27th EF-42.9%]; JAN 2016 START Arimidex; May 2017- EF- 60%; May 24th 2017-Re-start Herceptin q 3W; July 28th STOP herceptin [finished 70m/m2; sec to Low EF; however 2017 Oct EF= 67%; improved]  # DEC 4th 2017- Start Neratinib 4 pills; DEC 11th 3 pills; STOPPED.  # FEB 2018- RECURRENCE ER/PR positive; Her 2 NEGATIVE [liver Bx]  # MARCH 1st 2018- Tax-Cytoxan [poor tol]  # FEB 2018- Brain mets [SBRT; Dr.Crystal; finished Feb 28th 2018]  # March 2018- Eribulin- poor tolerance  # June 8th 2018- Started Faslodex + Abema [abema-multiple interruptions sec to neutropenia]  # MRI Brain- March 2019- Leptomeningeal mets [WBRT; No LP s/p WBRT- 01/09/2018]; STOP Abema [551mday-sec to severe cytopenia]+faslodex  # MAY 20tth 019- Start Xeloda; STOPPED in Nov 22nd 2019- sec to Progressive liver lesions on CT scan  # NOV 26th 2019- Faslodex + Piqaray 25082m; Jan 2020- Piqray 300 mg+ faslodex; FEB 17th CT scan- mixed response/ Overall progression; STOP Piqray + faslodex.   # FEB 25th 2020- ERIBULIN; Poor tolerance sec to severe neutropenia.  Stopped May 2020.  CT scan May 2020-new/progressive 2 cm liver lesion;   #July nd 2020-Ixempra x5 cycles-; NOV 2020-MRI liver- worsening liver lesions [rising AST/ALT].   # NOV [id number] weekly [consent-];  December 31-2020-progression/rising LFTs.  Stop Taxol  #January 15th, 2021-Adriamycin weekly [C]  #May 06, 2019-MRI brain 5 new lesions [3 mm to 10 mm]-asymptomatic; [previous whole brain radiation]- OCT 2020- Re-Irradiation.   # PN- G-2; may 2017- Cymbalta 63m23mMARCH 2018-  acute vascular insuff of BIL LE [? Taxotere vasoconstriction on asprin]; # hemorragic shock LGIB [s/p colo; Dr.Wohl]  #  SVT- on flecainide.   # April 2016- Liver Bx- NEG;   # Drop in EF from Herceptin [recovered OCt 27th- EF 65%]  # BMD- jan 2017- osteopenia  # 2020- SEP-s/p Pallaitive care evaluation  -----------------------------------------------------------     [place] 4th 2018- PDL-1 0%/NEG for BRCA; MSI-Stable; Positive for PI3K; ccnd-1; FGF** -----------------------------------------------------------  Dx: Metastatic Breast cancer [ER/PR-Pos; her -2 NEG] Stage IV; goals: palliative Current treatment-Adriamycin [C]   Carcinoma of upper-outer quadrant of left breast in female, estrogen receptor positive (HCC)[place]/25/2020 - 01/17/2019 Chemotherapy   The patient had eriBULin mesylate (HALAVEN) 2.1 mg in sodium chloride 0.9 % 100 mL chemo infusion, 1.2 mg/m2 = 2.1 mg (100 % of original dose 1.2 mg/m2), Intravenous,  Once, 3 of 4 cycles Dose modification: 1.2 mg/m2 (original dose 1.2 mg/m2, Cycle 1, Reason: Provider Judgment), 0.8 mg/m2 (original dose 1.2 mg/m2, Cycle 3, Reason: Provider Judgment) Administration: 2.1 mg (11/22/2018), 2.1 mg (11/29/2018), 2.1 mg (12/20/2018), 1.4 mg (01/17/2019)  for chemotherapy treatment.    03/30/2019 - 07/12/2019 Chemotherapy   The patient had pegfilgrastim (NEULASTA ONPRO KIT) injection 6 mg, 6 mg, Subcutaneous, Once, 5 of 8 cycles Administration: 6 mg (  03/30/2019), 6 mg (04/20/2019), 6 mg (05/11/2019), 6 mg (06/22/2019), 6 mg (06/01/2019) ixabepilone (IXEMPRA) 60 mg in lactated ringers 250 mL chemo infusion, 33.5 mg/m2 = 57 mg (100 % of original dose  32 mg/m2), Intravenous,  Once, 5 of 8 cycles Dose modification: 32 mg/m2 (original dose 32 mg/m2, Cycle 1, Reason: Provider Judgment) Administration: 72 mg (04/20/2019), 72 mg (05/11/2019), 72 mg (06/22/2019), 72 mg (06/01/2019)  for chemotherapy treatment.    08/18/2019 - 09/28/2019 Chemotherapy   The patient had PACLitaxel (TAXOL) 150 mg in sodium chloride 0.9 % 250 mL chemo infusion (</= 34m/m2), 80 mg/m2 = 150 mg, Intravenous,  Once, 1 of 4 cycles Administration: 150 mg (08/18/2019), 150 mg (09/08/2019), 150 mg (09/15/2019)  for chemotherapy treatment.    10/13/2019 -  Chemotherapy   The patient had DOXOrubicin (ADRIAMYCIN) chemo injection 38 mg, 20 mg/m2 = 38 mg, Intravenous,  Once, 2 of 7 cycles Administration: 38 mg (10/13/2019) palonosetron (ALOXI) injection 0.25 mg, 0.25 mg, Intravenous,  Once, 2 of 7 cycles Administration: 0.25 mg (10/13/2019)  for chemotherapy treatment.      INTERVAL HISTORY:  Sarah RAMSEYER89y.o.  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  Patient received Adriamycin cycle #1 last week.  Denies any nausea vomiting.  No fever no chills.  Continues to have mild swelling in the legs.  Appetite is fair.  No weight loss.  Chronic tingling and numbness next med is not any worse.   Review of Systems  Constitutional: Positive for malaise/fatigue. Negative for chills, diaphoresis, fever and weight loss.  HENT: Negative for nosebleeds and sore throat.   Eyes: Negative for double vision.  Respiratory: Negative for cough, hemoptysis, sputum production, shortness of breath and wheezing.   Cardiovascular: Negative for chest pain, palpitations, orthopnea and leg swelling.  Gastrointestinal: Negative for abdominal pain, blood in stool, constipation, heartburn, melena, nausea and vomiting.  Genitourinary: Negative for dysuria, frequency and  urgency.  Musculoskeletal: Positive for joint pain. Negative for back pain.  Skin: Negative for itching.  Neurological: Positive for tingling. Negative for dizziness, focal weakness, weakness and headaches.  Endo/Heme/Allergies: Does not bruise/bleed easily.  Psychiatric/Behavioral: Negative for depression. The patient is not nervous/anxious and does not have insomnia.     PAST MEDICAL HISTORY :  Past Medical History:  Diagnosis Date  . Chemotherapy-induced peripheral neuropathy ([place]   . Epistaxis    a. 11/2016 in setting of asa/plavix-->silver nitrate cauterization.  . GI bleed    a. 11/2016 Admission w/ GIB and hypovolemic shock req 3u PRBC's;  b. 11/2016 ECG: gastritis & nonbleeding peptic ulcer; c. 11/2016 Conlonoscopy: rectal and sigmoid colonic ulcers.  . Heart attack ([place]    a. 1998 Cath @ [facility]: reportedly no intervention required.  .Marland KitchenHerceptin-induced cardiomyopathy ([place]    a. In the setting of Herceptin Rx for breast cancer (initiated 12/2014); b. 03/2015 MUGA EF 64%; b. 08/2015 MUGA: EF 51%; c. 10/2015 MUGA: EF 44%; d. 11/2015 Echo: EF 45-50%; e. 01/2016 MUGA: EF 60%; f. 06/2016 MUGA EF 65%; g. 10/2016 MUGA: EF 61%;  h. 12/2016 Echo: EF 55-60%, gr1 DD.  .Marland KitchenHyperlipidemia   . Hypertension   . Neuropathy   . Possible PAD (peripheral artery disease) ([place]    a. 11/2016 LE cyanosis and weak pulses-->CTA w/o significant Ao-BiFem dzs. ? distal dzs-->ASA/Plavix initiated by vascular surgery.  .Marland KitchenPSVT (paroxysmal supraventricular tachycardia) ([place]    a. Dx  11/2016.  . Pulmonary embolism ([place])    a. 12/2016 CTA Chest: small nonocclusive PE in inferior segment of the Left lingula, somewhat eccentric filling defect suggesting chronic rather than acute embolic event; b. 0/2774 LE U/S:  No DVT; c. 12/2016 Echo: Nl RV fxn, nl PASP.  Marland Kitchen Recurrent Metastatic breast cancer ([place])    a. Dx 2016: Stage II, ER positive, PR positive, HER-2/neu overexpressing of the left breast-->chemo/radiation; b. 10/2016 CT  Abd/pelvis: diffuse liver mets, ill defined sclerotic bone lesions-T12;  c. 10/2016 MRI brain: metastatic lesion along L temporal lobe (19x63m) w/ extensive surrounding edema & 536mmidline shift to right.  . Sinus tachycardia     PAST SURGICAL HISTORY :   Past Surgical History:  Procedure Laterality Date  . BREAST BIOPSY Left 2016   Positive  . BREAST LUMPECTOMY WITH SENTINEL LYMPH NODE BIOPSY Left 05/23/2015   Procedure: LEFT BREAST WIDE EXCISION WITH AXILLARY DISSECTION, MASTOPLASTY ;  Surgeon: JeRobert BellowMD;  Location: [facility] ORS;  Service: General;  Laterality: Left;  . BREAST SURGERY Left 12/18/14   breast biopsy/INVASIVE DUCTAL CARCINOMA OF BREAST, NOTTINGHAM GRADE 2.  . Marland KitchenREAST SURGERY  05/23/2015.   Wide excision/mastoplasty, axillary dissection. No residual invasive cancer, positive for residual DCIS. 0/2 nodes identified on axillary dissection. (no SLN by technetium or methylene blue)  . CARDIAC CATHETERIZATION    . COLONOSCOPY WITH PROPOFOL N/A 12/10/2016   Procedure: COLONOSCOPY WITH PROPOFOL;  Surgeon: DaLucilla LameMD;  Location: [facility] END[facility];  Service: Endoscopy;  Laterality: N/A;  . COLONOSCOPY WITH PROPOFOL N/A 02/13/2017   Procedure: COLONOSCOPY WITH PROPOFOL;  Surgeon: WoLucilla LameMD;  [facility];  Service: Endoscopy;  Laterality: N/A;  . ESOPHAGOGASTRODUODENOSCOPY (EGD) WITH PROPOFOL N/A 12/08/2016   Procedure: ESOPHAGOGASTRODUODENOSCOPY (EGD) WITH PROPOFOL;  Surgeon: DaLucilla LameMD;  Location: [facility];  Service: Endoscopy;  Laterality: N/A;  . IVC FILTER INSERTION N/A 02/15/2017   Procedure: IVC Filter Insertion;  Surgeon: DeAlgernon HuxleyMD;  [facility];  Service: Cardiovascular;  Laterality: N/A;  . PORTACATH PLACEMENT Right 12-31-14   Dr ByBary Castilla  FAMILY HISTORY :   Family History  Problem Relation Age of Onset  . Breast cancer Maternal Aunt   . Breast cancer Cousin   . Brain cancer Maternal Uncle   . Diabetes Mother    . Hypertension Mother   . Stroke Mother     SOCIAL HISTORY:   Social History   Tobacco Use  . Smoking status: Light Tobacco Smoker    Packs/day: 0.50    Years: 18.00    Pack years: 9.00    Types: Cigarettes  . Smokeless tobacco: Never Used  Substance Use Topics  . Alcohol use: No    Alcohol/week: 0.0 standard drinks  . Drug use: No    ALLERGIES:  is allergic to no known allergies.  MEDICATIONS:  Current Outpatient Medications  Medication Sig Dispense Refill  . dexamethasone (DECADRON) 2 MG tablet Take 1 pill BID [morning and afternoon] 45 tablet 0  . flecainide (TAMBOCOR) 50 MG tablet Take 1 tablet (50 mg total) by mouth 2 (two) times daily. 60 tablet 4  . fluconazole (DIFLUCAN) 100 MG tablet Take 1 tablet (100 mg total) by mouth daily. 14 tablet 0  . ibuprofen (ADVIL) 400 MG tablet Take 1 tablet (400 mg total) by mouth 3 (three) times daily. 30 tablet 0  . ipratropium (ATROVENT) 0.03 % nasal spray Place 2 sprays into both nostrils every 12 (twelve)  hours. 30 mL 12  . magnesium oxide (MAG-OX) 400 MG tablet Take 1 tablet (400 mg total) by mouth 2 (two) times daily. 60 tablet 5  . meclizine (ANTIVERT) 25 MG tablet Take 1 tablet (25 mg total) by mouth 3 (three) times daily as needed for dizziness. 30 tablet 0  . metoprolol tartrate (LOPRESSOR) 25 MG tablet Take 1 tablet (25 mg total) by mouth 2 (two) times daily. 60 tablet 1  . montelukast (SINGULAIR) 10 MG tablet Take 1 tablet (10 mg total) by mouth at bedtime. 60 tablet 3  . pregabalin (LYRICA) 75 MG capsule TAKE 1 CAPSULE BY MOUTH TWO TIMES DAILY 60 capsule 2  . Vitamin D, Ergocalciferol, (DRISDOL) 1.25 MG (50000 UNIT) CAPS capsule TAKE 1 CAPSULE (50,000 UNITS TOTAL) ONCE A WEEK BY MOUTH. 12 capsule 1   No current facility-administered medications for this visit.   Facility-Administered Medications Ordered in Other Visits  Medication Dose Route Frequency Provider Last Rate Last Admin  . 0.9 %  sodium chloride infusion    Intravenous Continuous [name], MD   New Bag at 12/10/16 1140  . 0.9 %  sodium chloride infusion   Intravenous Continuous Lloyd Huger, MD 999 mL/hr at 04/24/15 1520 New Bag at 05/23/15 0951  . dexamethasone (DECADRON) 10 mg in sodium chloride 0.9 % 50 mL IVPB  10 mg Intravenous Once Cammie Sickle, MD 204 mL/hr at 10/20/19 1037 10 mg at 10/20/19 1037  . DOXOrubicin (ADRIAMYCIN) chemo injection 38 mg  20 mg/m2 (Treatment Plan Recorded) Intravenous Once Charlaine Dalton R, MD      . heparin lock flush 100 unit/mL  500 Units Intravenous Once Berenzon, Dmitriy, MD      . heparin lock flush 100 unit/mL  500 Units Intracatheter Once PRN Charlaine Dalton R, MD      . sodium chloride 0.9 % injection 10 mL  10 mL Intravenous PRN [name], MD   10 mL at 04/04/15 1440  . sodium chloride flush (NS) 0.9 % injection 10 mL  10 mL Intravenous PRN Berenzon, Dmitriy, MD        PHYSICAL EXAMINATION: ECOG PERFORMANCE STATUS: 1 - Symptomatic but completely ambulatory  BP (!) 149/103 (BP Location: Right Arm, Patient Position: Sitting, Cuff Size: Normal)   Pulse 93   Temp (!) 96.2 F (35.7 C) (Tympanic)   Resp 20   Wt 164 lb (74.4 kg)   BMI 26.47 kg/m   Filed Weights   10/20/19 0924  Weight: 164 lb (74.4 kg)    Physical Exam  Constitutional: She is oriented to person, place, and time and well-developed, well-nourished, and in no distress.  Alone.  Walking herself.  HENT:  Head: Normocephalic and atraumatic.  Mouth/Throat: Oropharynx is clear and moist. No oropharyngeal exudate.  Oral thrush.   Eyes: Pupils are equal, round, and reactive to light.  Cardiovascular: Normal rate and regular rhythm.  Pulmonary/Chest: Effort normal and breath sounds normal. No respiratory distress. She has no wheezes.  Abdominal: Soft. Bowel sounds are normal. She exhibits no distension and no mass. There is no abdominal tenderness. There is no rebound and no guarding.  Musculoskeletal:         General: No tenderness or edema. Normal range of motion.     Cervical back: Normal range of motion and neck supple.  Neurological: She is alert and oriented to person, place, and time.  Skin: Skin is warm.  Psychiatric: Affect normal.    LABORATORY DATA:  I have reviewed the  data as listed    Component Value Date/Time   NA 137 10/20/2019 0902   NA 135 03/04/2017 0918   NA 135 01/08/2015 0850   K 3.7 10/20/2019 0902   K 3.7 01/08/2015 0850   CL 102 10/20/2019 0902   CL 101 01/08/2015 0850   CO2 26 10/20/2019 0902   CO2 26 01/08/2015 0850   GLUCOSE 119 (H) 10/20/2019 0902   GLUCOSE 161 (H) 01/08/2015 0850   BUN 12 10/20/2019 0902   BUN 8 03/04/2017 0918   BUN 17 01/08/2015 0850   CREATININE 0.78 10/20/2019 0902   CREATININE 0.82 01/22/2015 1559   CALCIUM 8.9 10/20/2019 0902   CALCIUM 9.3 01/08/2015 0850   PROT 6.8 10/20/2019 0902   PROT 6.4 03/04/2017 0918   PROT 6.8 01/22/2015 1559   ALBUMIN 3.3 (L) 10/20/2019 0902   ALBUMIN 2.5 (L) 03/04/2017 0918   ALBUMIN 3.9 01/22/2015 1559   AST 90 (H) 10/20/2019 0902   AST 34 01/22/2015 1559   ALT 102 (H) 10/20/2019 0902   ALT 42 01/22/2015 1559   ALKPHOS 316 (H) 10/20/2019 0902   ALKPHOS 157 (H) 01/22/2015 1559   BILITOT 1.1 10/20/2019 0902   BILITOT 6.2 (H) 03/04/2017 0918   BILITOT 0.2 (L) 01/22/2015 1559   GFRNONAA >60 10/20/2019 0902   GFRNONAA >60 01/22/2015 1559   GFRAA >60 10/20/2019 0902   GFRAA >60 01/22/2015 1559    No results found for: SPEP, UPEP  Lab Results  Component Value Date   WBC 4.1 10/20/2019   NEUTROABS 3.3 10/20/2019   HGB 12.2 10/20/2019   HCT 38.0 10/20/2019   MCV 96.2 10/20/2019   PLT 232 10/20/2019      Chemistry      Component Value Date/Time   NA 137 10/20/2019 0902   NA 135 03/04/2017 0918   NA 135 01/08/2015 0850   K 3.7 10/20/2019 0902   K 3.7 01/08/2015 0850   CL 102 10/20/2019 0902   CL 101 01/08/2015 0850   CO2 26 10/20/2019 0902   CO2 26 01/08/2015 0850   BUN 12  10/20/2019 0902   BUN 8 03/04/2017 0918   BUN 17 01/08/2015 0850   CREATININE 0.78 10/20/2019 0902   CREATININE 0.82 01/22/2015 1559      Component Value Date/Time   CALCIUM 8.9 10/20/2019 0902   CALCIUM 9.3 01/08/2015 0850   ALKPHOS 316 (H) 10/20/2019 0902   ALKPHOS 157 (H) 01/22/2015 1559   AST 90 (H) 10/20/2019 0902   AST 34 01/22/2015 1559   ALT 102 (H) 10/20/2019 0902   ALT 42 01/22/2015 1559   BILITOT 1.1 10/20/2019 0902   BILITOT 6.2 (H) 03/04/2017 0918   BILITOT 0.2 (L) 01/22/2015 1559       RADIOGRAPHIC STUDIES: I have personally reviewed the radiological images as listed and agreed with the findings in the report. No results found.   ASSESSMENT & PLAN:  Carcinoma of upper-outer quadrant of left breast in female, estrogen receptor positive ([place]) # Recurrent metastatic breast cancer-ER PR positive HER-2 negative; NOV 2020- PROGRESSION in liver- multiple enhancing lesions noted.  On ADRIAMYCIN weekly.  # Proceed with Adriamycin weekly dose cycle #2 today. Labs today reviewed;  acceptable for treatment today-however LFTs elevated.   # Sinus tachycardia- on metoprolol-/Tamabcor- STABLE.   # Elevated LFTs-likely secondary to progressive malignancy; STABLE;  monitor closely.   # Brain metastases; Recurrent-s/p RT finshed 11/17.  MRI- improved; STOP dexamethasone today. STABLE.   # Peripheral neuropathy grade 1-2-  on Lyrica; stable  # Bil LE swelling- recommend compression stockings.  # Palliative care evaluation today- discussed with Josh.   # DISPOSITION:  # Adriamycin today # 1 week- MD; labs- cbc/cmp; ; adriamycin weekly- Dr.B   No orders of the defined types were placed in this encounter.  All questions were answered. The patient knows to call the clinic with any problems, questions or concerns.     Cammie Sickle, MD 10/20/2019 10:50 AM

## 2019-10-24 DIAGNOSIS — C787 Secondary malignant neoplasm of liver and intrahepatic bile duct: Secondary | ICD-10-CM | POA: Diagnosis not present

## 2019-10-24 DIAGNOSIS — C7931 Secondary malignant neoplasm of brain: Secondary | ICD-10-CM | POA: Diagnosis not present

## 2019-10-24 DIAGNOSIS — C50412 Malignant neoplasm of upper-outer quadrant of left female breast: Secondary | ICD-10-CM | POA: Diagnosis not present

## 2019-10-24 DIAGNOSIS — G620 Drug-induced polyneuropathy: Secondary | ICD-10-CM | POA: Diagnosis not present

## 2019-10-24 DIAGNOSIS — I10 Essential (primary) hypertension: Secondary | ICD-10-CM | POA: Diagnosis not present

## 2019-10-24 DIAGNOSIS — C182 Malignant neoplasm of ascending colon: Secondary | ICD-10-CM | POA: Diagnosis not present

## 2019-10-24 DIAGNOSIS — T451X5D Adverse effect of antineoplastic and immunosuppressive drugs, subsequent encounter: Secondary | ICD-10-CM | POA: Diagnosis not present

## 2019-10-24 DIAGNOSIS — C184 Malignant neoplasm of transverse colon: Secondary | ICD-10-CM | POA: Diagnosis not present

## 2019-10-24 DIAGNOSIS — D702 Other drug-induced agranulocytosis: Secondary | ICD-10-CM | POA: Diagnosis not present

## 2019-10-26 ENCOUNTER — Encounter: Payer: Self-pay | Admitting: Family Medicine

## 2019-10-26 ENCOUNTER — Ambulatory Visit (INDEPENDENT_AMBULATORY_CARE_PROVIDER_SITE_OTHER): Payer: Medicare HMO | Admitting: Family Medicine

## 2019-10-26 ENCOUNTER — Other Ambulatory Visit: Payer: Self-pay

## 2019-10-26 VITALS — BP 112/70 | HR 96 | Temp 97.9°F | Resp 16 | Ht 66.0 in | Wt 160.0 lb

## 2019-10-26 DIAGNOSIS — G620 Drug-induced polyneuropathy: Secondary | ICD-10-CM | POA: Diagnosis not present

## 2019-10-26 DIAGNOSIS — I10 Essential (primary) hypertension: Secondary | ICD-10-CM | POA: Diagnosis not present

## 2019-10-26 DIAGNOSIS — T451X5D Adverse effect of antineoplastic and immunosuppressive drugs, subsequent encounter: Secondary | ICD-10-CM | POA: Diagnosis not present

## 2019-10-26 DIAGNOSIS — R1310 Dysphagia, unspecified: Secondary | ICD-10-CM

## 2019-10-26 DIAGNOSIS — C184 Malignant neoplasm of transverse colon: Secondary | ICD-10-CM | POA: Diagnosis not present

## 2019-10-26 DIAGNOSIS — C50412 Malignant neoplasm of upper-outer quadrant of left female breast: Secondary | ICD-10-CM | POA: Diagnosis not present

## 2019-10-26 DIAGNOSIS — D702 Other drug-induced agranulocytosis: Secondary | ICD-10-CM | POA: Diagnosis not present

## 2019-10-26 DIAGNOSIS — J309 Allergic rhinitis, unspecified: Secondary | ICD-10-CM | POA: Diagnosis not present

## 2019-10-26 DIAGNOSIS — C7931 Secondary malignant neoplasm of brain: Secondary | ICD-10-CM | POA: Diagnosis not present

## 2019-10-26 DIAGNOSIS — C182 Malignant neoplasm of ascending colon: Secondary | ICD-10-CM | POA: Diagnosis not present

## 2019-10-26 DIAGNOSIS — C787 Secondary malignant neoplasm of liver and intrahepatic bile duct: Secondary | ICD-10-CM | POA: Diagnosis not present

## 2019-10-26 MED ORDER — PREGABALIN 100 MG PO CAPS: 100.0000 mg | ORAL_CAPSULE | Freq: Two times a day (BID) | ORAL | 2 refills | Status: DC

## 2019-10-26 MED ORDER — LEVOCETIRIZINE DIHYDROCHLORIDE 5 MG PO TABS: 5.0000 mg | ORAL_TABLET | Freq: Every evening | ORAL | 2 refills | Status: DC

## 2019-10-26 NOTE — Patient Instructions (Signed)
Follow up with GI about your trouble swallowing   Follow up with Dr. B about dose change or increase to lyrica for neuropathy - since it wasn't helping much, and we can try and go up to total of 300 mg a day.  Let me know if you would like me to refill or if he will take over?  T   Dysphagia  Dysphagia is trouble swallowing. This condition occurs when solids and liquids stick in a person's throat on the way down to the stomach, or when food takes longer to get to the stomach than usual. You may have problems swallowing food, liquids, or both. You may also have pain while trying to swallow. It may take you more time and effort to swallow something. What are the causes? This condition may be caused by:  Muscle problems. They may make it difficult for you to move food and liquids through the esophagus, which is the tube that connects your mouth to your stomach.  Blockages. You may have ulcers, scar tissue, or inflammation that blocks the normal passage of food and liquids. Causes of these problems include: ? Acid reflux from your stomach into your esophagus (gastroesophageal reflux). ? Infections. ? Radiation treatment for cancer. ? Medicines taken without enough fluids to wash them down into your stomach.  Stroke. This can affect the nerves and make it difficult to swallow.  Nerve problems. These prevent signals from being sent to the muscles of your esophagus to squeeze (contract) and move what you swallow down to your stomach.  Globus pharyngeus. This is a common problem that involves a feeling like something is stuck in your throat or a sense of trouble with swallowing, even though nothing is wrong with the swallowing passages.  Certain conditions, such as cerebral palsy or Parkinson's disease. What are the signs or symptoms? Common symptoms of this condition include:  A feeling that solids or liquids are stuck in your throat on the way down to the stomach.  Pain while  swallowing.  Coughing or gagging while trying to swallow. Other symptoms include:  Food moving back from your stomach to your mouth (regurgitation).  Noises coming from your throat.  Chest discomfort with swallowing.  A feeling of fullness when swallowing.  Drooling, especially when the throat is blocked.  Heartburn. How is this diagnosed? This condition may be diagnosed by:  Barium X-ray. In this test, you will swallow a white liquid that sticks to the inside of your esophagus. X-ray images are then taken.  Endoscopy. In this test, a flexible telescope is inserted down your throat to look at your esophagus and your stomach.  CT scans and an MRI. How is this treated? Treatment for dysphagia depends on the cause of this condition, such as:  If the dysphagia is caused by acid reflux or infection, medicines may be used. They may include antibiotics and heartburn medicines.  If the dysphagia is caused by problems with the muscles, swallowing therapy may be used to help you strengthen your swallowing muscles. You may have to do specific exercises to strengthen the muscles or stretch them.  If the dysphagia is caused by a blockage or mass, procedures to remove the blockage may be done. You may need surgery and a feeding tube. You may need to make diet changes. Ask your health care provider for specific instructions. Follow these instructions at home: Medicines  Take over-the-counter and prescription medicines only as told by your health care provider.  If you were prescribed  an antibiotic medicine, take it as told by your health care provider. Do not stop taking the antibiotic even if you start to feel better. Eating and drinking   Follow any diet changes as told by your health care provider.  Work with a diet and nutrition specialist (dietitian) to create an eating plan that will help you get the nutrients you need in order to stay healthy.  Eat soft foods that are easier to  swallow.  Cut your food into small pieces and eat slowly. Take small bites.  Eat and drink only when you are sitting upright.  Do not drink alcohol or caffeine. If you need help quitting, ask your health care provider. General instructions  Check your weight every day to make sure you are not losing weight.  Do not use any products that contain nicotine or tobacco, such as cigarettes, e-cigarettes, and chewing tobacco. If you need help quitting, ask your health care provider.  Keep all follow-up visits as told by your health care provider. This is important. Contact a health care provider if you:  Lose weight because you cannot swallow.  Cough when you drink liquids.  Cough up partially digested food. Get help right away if you:  Cannot swallow your saliva.  Have shortness of breath, a fever, or both.  Have a hoarse voice and also have trouble swallowing. Summary  Dysphagia is trouble swallowing. This condition occurs when solids and liquids stick in a person's throat on the way down to the stomach. You may cough or gag while trying to swallow.  Dysphagia has many possible causes.  Treatment for dysphagia depends on the cause of the condition.  Keep all follow-up visits as told by your health care provider. This is important. This information is not intended to replace advice given to you by your health care provider. Make sure you discuss any questions you have with your health care provider. Document Revised: 02/08/2019 Document Reviewed: 02/08/2019 Elsevier Patient Education  [employer].

## 2019-10-26 NOTE — Progress Notes (Signed)
Name: Sarah Horne   MRN: [id number]    DOB: 08/10/1956   Date:10/26/2019       Progress Note  Chief Complaint  Patient presents with  . Follow-up  . Dysphagia  . Hospitalization Follow-up    sepsis 3 weeks ago     Subjective:   Sarah Horne is a 64 y.o. female, presents to clinic for routine follow up on the conditions listed above, hospital f/up from admission 3 weeks ago, has seen oncology and had labs done, and new dysphagia  Pt is new to me, reviewed admission, labs, oncology visits over the last month  Doing chemo weekly stage 4 breast cancer mets to brain and liver lungs - Seeing Dr. Jacinto Reap, pt recently did palliative care consult - reviewed note.  Dysphagia - new, onset about 1.5 weeks ago, did not have while she was in hospital.  Reviewed imaging - CT cervical spine did not have any pertinent findings relative to swallowing (though limited non contrasted scan) she has cervical spine fusion.  She states that pills and all food she is having trouble swallowing, choking on it, problem up in her neck and throat it feels stuck and is painful.  She can still feel same problem with liquids- still painful to same location she is pointing to right upper neck area.  States she can swallow and after swallowing phase, high up in her neck she feels everything is stuck and painful.  She states is occurring with all foods even with more careful chewing last night she choked on a soft sandwich.  Liquids go to the same area and 30 pills painful once it feels stuck.  She has never had anything feel completely obstructed she never vomited she denies any change to appearance or feel of her neck or cervical spine.  She is having some postnasal drainage that she denies sore throat, sinus congestion pain fever sweats chills denies coughing, denies any aspiration. Asked her if she is ever seen a gastroenterologist or GI doctor and she says she never has on chart there is a diagnosis of GI bleed but even  asking her if she saw anyone for that she does not remember seeing a GI doc.  She would like to go somewhere in [place]. She has not lost any weight in the last 1 to 2 weeks  Patient Active Problem List   Diagnosis Date Noted  . Accidental fall from chair, initial encounter 10/05/2019  . Metastatic breast cancer ([place]) 10/05/2019  . Sepsis ([name]) 10/05/2019  . Tachyarrhythmia 10/05/2019  . Brain metastasis ([place]) 05/19/2017  . Blood in stool   . Polyp of sigmoid colon   . Benign neoplasm of transverse colon   . Benign neoplasm of ascending colon   . Lower GI bleed   . Weakness of right lower extremity 02/11/2017  . Orthostatic lightheadedness 01/14/2017  . Atrial tachycardia ([place]) 01/14/2017  . Hypokalemia 01/14/2017  . Pulmonary emboli ([place]) 01/12/2017  . Dehydration 12/25/2016  . Blood loss anemia 12/15/2016  . Ulceration of colon   . Heartburn   . Duodenitis   . Gastritis without bleeding   . GI bleed   . Acute esophagitis   . Acute esophagogastric ulcer   . Rectal bleed   . Hypovolemic shock ([place])   . Ischemic leg 12/04/2016  . Chemotherapy induced neutropenia ([place]) 11/20/2016  . Palliative care by specialist   . Goals of care, counseling/discussion   . DNR (do not resuscitate) discussion   .  Cerebral edema ([place]) 11/10/2016  . Encounter for monitoring cardiotoxic drug therapy 11/09/2016  . Elevated LFTs 10/20/2016  . Peripheral neuropathy due to chemotherapy ([place]) 03/13/2016  . Encounter for antineoplastic chemotherapy 03/13/2016  . HLD (hyperlipidemia) 01/29/2016  . Hyperglycemia, unspecified 01/29/2016  . Cardiomyopathy ([place]) 01/13/2016  . Hypertension 07/18/2015  . Carcinoma of upper-outer quadrant of left breast in female, estrogen receptor positive ([place]) 12/27/2014    Past Surgical History:  Procedure Laterality Date  . BREAST BIOPSY Left 2016   Positive  . BREAST LUMPECTOMY WITH SENTINEL LYMPH NODE BIOPSY Left 05/23/2015   Procedure: LEFT BREAST WIDE  EXCISION WITH AXILLARY DISSECTION, MASTOPLASTY ;  Surgeon: [name], MD;  Location: [facility] ORS;  Service: General;  Laterality: Left;  . BREAST SURGERY Left 12/18/14   breast biopsy/INVASIVE DUCTAL CARCINOMA OF BREAST, NOTTINGHAM GRADE 2.  Marland Kitchen BREAST SURGERY  05/23/2015.   Wide excision/mastoplasty, axillary dissection. No residual invasive cancer, positive for residual DCIS. 0/2 nodes identified on axillary dissection. (no SLN by technetium or methylene blue)  . CARDIAC CATHETERIZATION    . COLONOSCOPY WITH PROPOFOL N/A 12/10/2016   Procedure: COLONOSCOPY WITH PROPOFOL;  Surgeon: Lucilla Lame, MD;  Location: [facility] ENDOSCOPY;  Service: Endoscopy;  Laterality: N/A;  . COLONOSCOPY WITH PROPOFOL N/A 02/13/2017   Procedure: COLONOSCOPY WITH PROPOFOL;  Surgeon: Lucilla Lame, MD;  Location: [facility] ENDOSCOPY;  Service: Endoscopy;  Laterality: N/A;  . ESOPHAGOGASTRODUODENOSCOPY (EGD) WITH PROPOFOL N/A 12/08/2016   Procedure: ESOPHAGOGASTRODUODENOSCOPY (EGD) WITH PROPOFOL;  Surgeon: Lucilla Lame, MD;  Location: [facility] ENDOSCOPY;  Service: Endoscopy;  Laterality: N/A;  . IVC FILTER INSERTION N/A 02/15/2017   Procedure: IVC Filter Insertion;  Surgeon: Algernon Huxley, MD;  Location: [facility];  Service: Cardiovascular;  Laterality: N/A;  . PORTACATH PLACEMENT Right 12-31-14   Dr Bary Castilla    Family History  Problem Relation Age of Onset  . Breast cancer Maternal Aunt   . Breast cancer Cousin   . Brain cancer Maternal Uncle   . Diabetes Mother   . Hypertension Mother   . Stroke Mother     Social History   Socioeconomic History  . Marital status: Divorced    Spouse name: Not on file  . Number of children: Not on file  . Years of education: Not on file  . Highest education level: Not on file  Occupational History  . Not on file  Tobacco Use  . Smoking status: Light Tobacco Smoker    Packs/day: 0.50    Years: 18.00    Pack years: 9.00    Types: Cigarettes  . Smokeless tobacco: Never Used    Substance and Sexual Activity  . Alcohol use: No    Alcohol/week: 0.0 standard drinks  . Drug use: No  . Sexual activity: Not on file  Other Topics Concern  . Not on file  Social History Narrative   Lives in [place] by herself.   Social Determinants of Health   Financial Resource Strain:   . Difficulty of Paying Living Expenses: Not on file  Food Insecurity:   . Worried About Charity fundraiser in the Last Year: Not on file  . Ran Out of Food in the Last Year: Not on file  Transportation Needs:   . Lack of Transportation (Medical): Not on file  . Lack of Transportation (Non-Medical): Not on file  Physical Activity:   . Days of Exercise per Week: Not on file  . Minutes of Exercise per Session: Not on file  Stress:   . Feeling of Stress : Not on file  Social Connections:   . Frequency of Communication with Friends and Family: Not on file  . Frequency of Social Gatherings with Friends and Family: Not on file  . Attends Religious Services: Not on file  . Active Member of Clubs or Organizations: Not on file  . Attends Archivist Meetings: Not on file  . Marital Status: Not on file  Intimate Partner Violence:   . Fear of Current or Ex-Partner: Not on file  . Emotionally Abused: Not on file  . Physically Abused: Not on file  . Sexually Abused: Not on file     Current Outpatient Medications:  .  flecainide (TAMBOCOR) 50 MG tablet, Take 1 tablet (50 mg total) by mouth 2 (two) times daily., Disp: 60 tablet, Rfl: 4 .  ipratropium (ATROVENT) 0.03 % nasal spray, Place 2 sprays into both nostrils every 12 (twelve) hours., Disp: 30 mL, Rfl: 12 .  magnesium oxide (MAG-OX) 400 MG tablet, Take 1 tablet (400 mg total) by mouth 2 (two) times daily., Disp: 60 tablet, Rfl: 5 .  meclizine (ANTIVERT) 25 MG tablet, Take 1 tablet (25 mg total) by mouth 3 (three) times daily as needed for dizziness., Disp: 30 tablet, Rfl: 0 .  metoprolol tartrate (LOPRESSOR) 25 MG tablet, Take 1  tablet (25 mg total) by mouth 2 (two) times daily., Disp: 60 tablet, Rfl: 1 .  montelukast (SINGULAIR) 10 MG tablet, Take 1 tablet (10 mg total) by mouth at bedtime., Disp: 60 tablet, Rfl: 3 .  pregabalin (LYRICA) 75 MG capsule, TAKE 1 CAPSULE BY MOUTH TWO TIMES DAILY, Disp: 60 capsule, Rfl: 2 .  Vitamin D, Ergocalciferol, (DRISDOL) 1.25 MG (50000 UNIT) CAPS capsule, TAKE 1 CAPSULE (50,000 UNITS TOTAL) ONCE A WEEK BY MOUTH., Disp: 12 capsule, Rfl: 1 No current facility-administered medications for this visit.  Facility-Administered Medications Ordered in Other Visits:  .  0.9 %  sodium chloride infusion, , Intravenous, Continuous, [name], MD, New Bag at 12/10/16 1140 .  0.9 %  sodium chloride infusion, , Intravenous, Continuous, Lloyd Huger, MD, Last Rate: 999 mL/hr at 04/24/15 1520, New Bag at 05/23/15 0951 .  heparin lock flush 100 unit/mL, 500 Units, Intravenous, Once, Berenzon, Dmitriy, MD .  sodium chloride 0.9 % injection 10 mL, 10 mL, Intravenous, PRN, Ma Hillock, Sandeep, MD, 10 mL at 04/04/15 1440 .  sodium chloride flush (NS) 0.9 % injection 10 mL, 10 mL, Intravenous, PRN, Berenzon, Dmitriy, MD  Allergies  Allergen Reactions  . No Known Allergies     Chart Review Today: I personally reviewed active problem list, medication list, allergies, family history, social history, health maintenance, notes from last encounter, lab results, imaging with the patient/caregiver today.   Review of Systems  Constitutional: Negative.   HENT: Negative.   Eyes: Negative.   Respiratory: Negative.   Cardiovascular: Negative.   Gastrointestinal: Negative.   Endocrine: Negative.   Genitourinary: Negative.   Musculoskeletal: Negative.   Skin: Negative.   Allergic/Immunologic: Negative.   Neurological: Negative.   Hematological: Negative.   Psychiatric/Behavioral: Negative.   All other systems reviewed and are negative. 10 Systems reviewed and are negative for acute change except  as noted in the HPI.    Objective:    Vitals:   10/26/19 1100  BP: 112/70  Pulse: 96  Resp: 16  Temp: 97.9 F (36.6 C)  SpO2: 96%  Weight: 160 lb (72.6 kg)  Height: 5\' 6"  (1.676 m)  Body mass index is 25.82 kg/m.  Physical Exam Vitals and nursing note reviewed.  Constitutional:      General: She is not in acute distress.    Appearance: She is well-developed. She is not ill-appearing, toxic-appearing or diaphoretic.     Interventions: Face mask in place.     Comments: Elderly female, NAD, non-toxic appearing, wears sun glasses and mask  HENT:     Head: Normocephalic.     Right Ear: External ear normal.     Left Ear: External ear normal.     Nose: Mucosal edema present. No rhinorrhea.     Right Turbinates: Enlarged.     Left Turbinates: Enlarged.     Mouth/Throat:     Mouth: Mucous membranes are moist.     Pharynx: Oropharynx is clear. No oropharyngeal exudate or posterior oropharyngeal erythema.  Neck:     Thyroid: No thyroid mass, thyromegaly or thyroid tenderness.     Trachea: Trachea and phonation normal.     Comments: Normal neck flexion, limited extension, cervical kyphosis Cardiovascular:     Pulses: Normal pulses.     Heart sounds: Normal heart sounds.  Pulmonary:     Effort: Pulmonary effort is normal.     Breath sounds: Normal breath sounds.  Abdominal:     General: Bowel sounds are normal.     Palpations: Abdomen is soft.  Musculoskeletal:     Cervical back: No edema, erythema, torticollis or crepitus. No pain with movement, spinous process tenderness or muscular tenderness. Normal range of motion.  Lymphadenopathy:     Head:     Right side of head: No submental, submandibular or tonsillar adenopathy.     Left side of head: No submental, submandibular or tonsillar adenopathy.     Cervical: No cervical adenopathy.  Skin:    General: Skin is warm.     Coloration: Skin is not jaundiced or pale.  Neurological:     Mental Status: She is alert.    Psychiatric:        Mood and Affect: Mood normal.        Behavior: Behavior normal.        PHQ2/9: Depression screen [facility] 2/9 10/26/2019  Decreased Interest 0  Down, Depressed, Hopeless 0  PHQ - 2 Score 0  Altered sleeping 0  Tired, decreased energy 0  Change in appetite 1  Feeling bad or failure about yourself  0  Trouble concentrating 0  Moving slowly or fidgety/restless 0  Suicidal thoughts 0  PHQ-9 Score 1  Difficult doing work/chores Not difficult at all  Some recent data might be hidden    phq 9 is neg, reviewed today  Fall Risk: Fall Risk  10/26/2019 11/12/2017 02/24/2017 01/18/2017 11/26/2016  Falls in the past year? 1 No Yes No Yes  Number falls in past yr: 1 - 2 or more - 1  Injury with Fall? 0 - No - No  Risk for fall due to : - - - - History of fall(s)  Follow up - - - - Falls evaluation completed    Functional Status Survey: Is the patient deaf or have difficulty hearing?: No Does the patient have difficulty seeing, even when wearing glasses/contacts?: No Does the patient have difficulty concentrating, remembering, or making decisions?: No Does the patient have difficulty walking or climbing stairs?: Yes Does the patient have difficulty dressing or bathing?: No Does the patient have difficulty doing errands alone such as visiting a doctor's office or shopping?: No  Assessment & Plan:   Dysphagia, unspecified type - Plan: Ambulatory referral to Gastroenterology Discussed options of possibly getting some imaging but with dysphagia of her pills, solids and liquids would like to get her to GI as quick as we can to get this assessed I cannot palpate or appreciate any masses nodules, lymphadenopathy or asymmetry she is not having any difficulty swallowing her secretions, she states that it is painful but it is not a sore throat -feel that GI may be the best thing to do initially but she may possibly also need SLP or ENT?  Neuropathy due to drugs ([place]) - Plan:  pregabalin (LYRICA) 100 MG capsule Patient states Lyrica 75 mg has never helped her neuropathy which is secondary to chemo did discuss with her we can increase the dose and we can management or she may want to ask Dr. B  Allergic rhinitis, unspecified seasonality, unspecified trigger - Plan: levocetirizine (XYZAL) 5 MG tablet She did complain of postnasal drainage that a nose spray she got from another provider was not effective encouraged her to try Flonase or Nasonex and can try an oral antihistamine she is already on Singulair  Reviewed her hospitalization and discharge summary all of the follow-up labs have been done, has been out of the window for transition of care visit, did review her labs her nodes any med changes all questions asked and answered   Return in about 3 months (around 01/24/2020) for Routine follow-up.   Delsa Grana, PA-C 10/26/19 11:10 AM

## 2019-10-26 NOTE — Progress Notes (Signed)
Patient pre screened for office appointment, no questions or concerns today. Patient reminded of upcoming appointment time and date. 

## 2019-10-27 ENCOUNTER — Ambulatory Visit
Admission: RE | Admit: 2019-10-27 | Discharge: 2019-10-27 | Disposition: A | Discharge status: Home / Self Care | Payer: Medicare HMO | Source: Ambulatory Visit | Attending: Internal Medicine | Admitting: Internal Medicine

## 2019-10-27 ENCOUNTER — Inpatient Hospital Stay: Payer: Medicare HMO

## 2019-10-27 ENCOUNTER — Encounter: Payer: Self-pay | Admitting: Internal Medicine

## 2019-10-27 ENCOUNTER — Other Ambulatory Visit: Payer: Self-pay

## 2019-10-27 ENCOUNTER — Inpatient Hospital Stay (HOSPITAL_BASED_OUTPATIENT_CLINIC_OR_DEPARTMENT_OTHER): Payer: Medicare HMO | Admitting: Internal Medicine

## 2019-10-27 ENCOUNTER — Other Ambulatory Visit: Payer: Self-pay | Admitting: Internal Medicine

## 2019-10-27 VITALS — BP 119/81 | HR 108 | Temp 97.0°F | Resp 20 | Ht 66.0 in | Wt 159.0 lb

## 2019-10-27 DIAGNOSIS — Z170 Estrogen receptor positive status [ER+]: Secondary | ICD-10-CM | POA: Diagnosis not present

## 2019-10-27 DIAGNOSIS — T451X5A Adverse effect of antineoplastic and immunosuppressive drugs, initial encounter: Secondary | ICD-10-CM | POA: Diagnosis not present

## 2019-10-27 DIAGNOSIS — Z5111 Encounter for antineoplastic chemotherapy: Secondary | ICD-10-CM | POA: Diagnosis not present

## 2019-10-27 DIAGNOSIS — R1310 Dysphagia, unspecified: Secondary | ICD-10-CM

## 2019-10-27 DIAGNOSIS — Z95828 Presence of other vascular implants and grafts: Secondary | ICD-10-CM

## 2019-10-27 DIAGNOSIS — R7989 Other specified abnormal findings of blood chemistry: Secondary | ICD-10-CM | POA: Diagnosis not present

## 2019-10-27 DIAGNOSIS — C50412 Malignant neoplasm of upper-outer quadrant of left female breast: Secondary | ICD-10-CM

## 2019-10-27 DIAGNOSIS — Z7189 Other specified counseling: Secondary | ICD-10-CM

## 2019-10-27 DIAGNOSIS — R1319 Other dysphagia: Secondary | ICD-10-CM

## 2019-10-27 DIAGNOSIS — C787 Secondary malignant neoplasm of liver and intrahepatic bile duct: Secondary | ICD-10-CM | POA: Diagnosis not present

## 2019-10-27 DIAGNOSIS — Z515 Encounter for palliative care: Secondary | ICD-10-CM | POA: Diagnosis not present

## 2019-10-27 DIAGNOSIS — G620 Drug-induced polyneuropathy: Secondary | ICD-10-CM | POA: Diagnosis not present

## 2019-10-27 DIAGNOSIS — C7931 Secondary malignant neoplasm of brain: Secondary | ICD-10-CM | POA: Diagnosis not present

## 2019-10-27 LAB — CBC WITH DIFFERENTIAL/PLATELET
Abs Immature Granulocytes: 0.01 10*3/uL (ref 0.00–0.07)
Basophils Absolute: 0 10*3/uL (ref 0.0–0.1)
Basophils Relative: 0 %
Eosinophils Absolute: 0 10*3/uL (ref 0.0–0.5)
Eosinophils Relative: 1 %
HCT: 38.1 % (ref 36.0–46.0)
Hemoglobin: 12.1 g/dL (ref 12.0–15.0)
Immature Granulocytes: 0 %
Lymphocytes Relative: 28 %
Lymphs Abs: 0.7 10*3/uL (ref 0.7–4.0)
MCH: 31.3 pg (ref 26.0–34.0)
MCHC: 31.8 g/dL (ref 30.0–36.0)
MCV: 98.4 fL (ref 80.0–100.0)
Monocytes Absolute: 0 10*3/uL — ABNORMAL LOW (ref 0.1–1.0)
Monocytes Relative: 2 %
Neutro Abs: 1.7 10*3/uL (ref 1.7–7.7)
Neutrophils Relative %: 69 %
Platelets: 202 10*3/uL (ref 150–400)
RBC: 3.87 MIL/uL (ref 3.87–5.11)
RDW: 18.4 % — ABNORMAL HIGH (ref 11.5–15.5)
WBC: 2.5 10*3/uL — ABNORMAL LOW (ref 4.0–10.5)
nRBC: 0 % (ref 0.0–0.2)

## 2019-10-27 LAB — COMPREHENSIVE METABOLIC PANEL
ALT: 85 U/L — ABNORMAL HIGH (ref 0–44)
AST: 67 U/L — ABNORMAL HIGH (ref 15–41)
Albumin: 3.5 g/dL (ref 3.5–5.0)
Alkaline Phosphatase: 280 U/L — ABNORMAL HIGH (ref 38–126)
Anion gap: 8 (ref 5–15)
BUN: 13 mg/dL (ref 8–23)
CO2: 26 mmol/L (ref 22–32)
Calcium: 8.8 mg/dL — ABNORMAL LOW (ref 8.9–10.3)
Chloride: 102 mmol/L (ref 98–111)
Creatinine, Ser: 0.71 mg/dL (ref 0.44–1.00)
GFR calc Af Amer: >60 mL/min (ref >60)
GFR calc non Af Amer: >60 mL/min (ref >60)
Glucose, Bld: 87 mg/dL (ref 70–99)
Potassium: 3.8 mmol/L (ref 3.5–5.1)
Sodium: 136 mmol/L (ref 135–145)
Total Bilirubin: 1.4 mg/dL — ABNORMAL HIGH (ref 0.3–1.2)
Total Protein: 7 g/dL (ref 6.5–8.1)

## 2019-10-27 MED ORDER — SODIUM CHLORIDE 0.9% FLUSH
10.0000 mL | INTRAVENOUS | Status: DC | PRN
  Filled 2019-10-27: qty 10

## 2019-10-27 MED ORDER — HEPARIN SOD (PORK) LOCK FLUSH 100 UNIT/ML IV SOLN
500.0000 [IU] | Freq: Once | INTRAVENOUS | Status: AC | PRN
  Administered 2019-10-27: 500 [IU]
  Filled 2019-10-27: qty 5

## 2019-10-27 MED ORDER — SODIUM CHLORIDE 0.9 % IV SOLN: 4.0000 mg | Freq: Once | INTRAVENOUS | Status: DC

## 2019-10-27 MED ORDER — PALONOSETRON HCL INJECTION 0.25 MG/5ML
0.2500 mg | Freq: Once | INTRAVENOUS | Status: AC
  Administered 2019-10-27: 0.25 mg via INTRAVENOUS
  Filled 2019-10-27: qty 5

## 2019-10-27 MED ORDER — SODIUM CHLORIDE 0.9% FLUSH
10.0000 mL | Freq: Once | INTRAVENOUS | Status: AC
  Administered 2019-10-27: 10 mL via INTRAVENOUS
  Filled 2019-10-27: qty 10

## 2019-10-27 MED ORDER — HEPARIN SOD (PORK) LOCK FLUSH 100 UNIT/ML IV SOLN
INTRAVENOUS | Status: AC
  Filled 2019-10-27: qty 5

## 2019-10-27 MED ORDER — DEXAMETHASONE SODIUM PHOSPHATE 10 MG/ML IJ SOLN
4.0000 mg | Freq: Once | INTRAMUSCULAR | Status: AC
  Administered 2019-10-27: 4 mg via INTRAVENOUS
  Filled 2019-10-27: qty 1

## 2019-10-27 MED ORDER — FLUCONAZOLE 200 MG PO TABS: 200.0000 mg | ORAL_TABLET | Freq: Every day | ORAL | 0 refills | Status: DC

## 2019-10-27 MED ORDER — DOXORUBICIN HCL CHEMO IV INJECTION 2 MG/ML
20.0000 mg/m2 | Freq: Once | INTRAVENOUS | Status: AC
  Administered 2019-10-27: 38 mg via INTRAVENOUS
  Filled 2019-10-27: qty 19

## 2019-10-27 MED ORDER — SODIUM CHLORIDE 0.9 % IV SOLN
Freq: Once | INTRAVENOUS | Status: AC
  Filled 2019-10-27: qty 250

## 2019-10-27 NOTE — Progress Notes (Signed)
HR 108 ok per MD

## 2019-10-27 NOTE — Progress Notes (Signed)
Bili 1.4.  Asked MD if wanted to reduce dose of doxorubicin (50%).  Keep at current dose per MD

## 2019-10-27 NOTE — Progress Notes (Signed)
[facility] OFFICE PROGRESS NOTE  Patient Care Team: Delsa Grana, PA-C as PCP - General (Family Medicine) Wellington Hampshire, MD as PCP - Cardiology (Cardiology) Byrnett, Forest Gleason, MD (General Surgery) [name], MD (Inactive) as Attending Physician (Internal Medicine)  Cancer Staging No matching staging information was found for the patient.   Oncology History Overview Note  # LEFT BREAST IDC; STAGE II [cT2N1] ER >90%; PR- 50-90%; her 2 NEU- POS; s/p Neoadj chemo; AUG 2016- TCH+P s/p Lumpec & partial ALND- path CR; s/p RT [finished Nov 2016];  adj Herceptin; HELD for Jan 19th 2017 [in DEC 2016-EF dropped from 63 to 51%; FEB 27th EF-42.9%]; JAN 2016 START Arimidex; May 2017- EF- 60%; May 24th 2017-Re-start Herceptin q 3W; July 28th STOP herceptin [finished 48m/m2; sec to Low EF; however 2017 Oct EF= 67%; improved]  # DEC 4th 2017- Start Neratinib 4 pills; DEC 11th 3 pills; STOPPED.  # FEB 2018- RECURRENCE ER/PR positive; Her 2 NEGATIVE [liver Bx]  # MARCH 1st 2018- Tax-Cytoxan [poor tol]  # FEB 2018- Brain mets [SBRT; Dr.Crystal; finished Feb 28th 2018]  # March 2018- Eribulin- poor tolerance  # June 8th 2018- Started Faslodex + Abema [abema-multiple interruptions sec to neutropenia]  # MRI Brain- March 2019- Leptomeningeal mets [WBRT; No LP s/p WBRT- 01/09/2018]; STOP Abema [566mday-sec to severe cytopenia]+faslodex  # MAY 20tth 019- Start Xeloda; STOPPED in Nov 22nd 2019- sec to Progressive liver lesions on CT scan  # NOV 26th 2019- Faslodex + Piqaray 25049m; Jan 2020- Piqray 300 mg+ faslodex; FEB 17th CT scan- mixed response/ Overall progression; STOP Piqray + faslodex.   # FEB 25th 2020- ERIBULIN; Poor tolerance sec to severe neutropenia.  Stopped May 2020.  CT scan May 2020-new/progressive 2 cm liver lesion;   #July nd 2020-Ixempra x5 cycles-; NOV 2020-MRI liver- worsening liver lesions [rising AST/ALT].   # NOV [id number] weekly [consent-];  December 31-2020-progression/rising LFTs.  Stop Taxol  #January 15th, 2021-Adriamycin weekly [C]  #May 06, 2019-MRI brain 5 new lesions [3 mm to 10 mm]-asymptomatic; [previous whole brain radiation]- OCT 2020- Re-Irradiation.   # PN- G-2; may 2017- Cymbalta 5m71mMARCH 2018-  acute vascular insuff of BIL LE [? Taxotere vasoconstriction on asprin]; # hemorragic shock LGIB [s/p colo; Dr.Wohl]  #  SVT- on flecainide.   # April 2016- Liver Bx- NEG;   # Drop in EF from Herceptin [recovered OCt 27th- EF 65%]  # BMD- jan 2017- osteopenia  # 2020- SEP-s/p Pallaitive care evaluation  -----------------------------------------------------------     [place]- Sep 4th 2018- PDL-1 0%/NEG for BRCA; MSI-Stable; Positive for PI3K; ccnd-1; FGF** -----------------------------------------------------------  Dx: Metastatic Breast cancer [ER/PR-Pos; her -2 NEG] Stage IV; goals: palliative Current treatment-Adriamycin [C]   Carcinoma of upper-outer quadrant of left breast in female, estrogen receptor positive (HCC)[place]/25/2020 - 01/17/2019 Chemotherapy   The patient had eriBULin mesylate (HALAVEN) 2.1 mg in sodium chloride 0.9 % 100 mL chemo infusion, 1.2 mg/m2 = 2.1 mg (100 % of original dose 1.2 mg/m2), Intravenous,  Once, 3 of 4 cycles Dose modification: 1.2 mg/m2 (original dose 1.2 mg/m2, Cycle 1, Reason: Provider Judgment), 0.8 mg/m2 (original dose 1.2 mg/m2, Cycle 3, Reason: Provider Judgment) Administration: 2.1 mg (11/22/2018), 2.1 mg (11/29/2018), 2.1 mg (12/20/2018), 1.4 mg (01/17/2019)  for chemotherapy treatment.    03/30/2019 - 07/12/2019 Chemotherapy   The patient had pegfilgrastim (NEULASTA ONPRO KIT) injection 6 mg, 6 mg, Subcutaneous, Once, 5 of 8 cycles Administration: 6 mg (03/30/2019),  6 mg (04/20/2019), 6 mg (05/11/2019), 6 mg (06/22/2019), 6 mg (06/01/2019) ixabepilone (IXEMPRA) 60 mg in lactated ringers 250 mL chemo infusion, 33.5 mg/m2 = 57 mg (100 % of original dose  32 mg/m2), Intravenous,  Once, 5 of 8 cycles Dose modification: 32 mg/m2 (original dose 32 mg/m2, Cycle 1, Reason: Provider Judgment) Administration: 72 mg (04/20/2019), 72 mg (05/11/2019), 72 mg (06/22/2019), 72 mg (06/01/2019)  for chemotherapy treatment.    08/18/2019 - 09/28/2019 Chemotherapy   The patient had PACLitaxel (TAXOL) 150 mg in sodium chloride 0.9 % 250 mL chemo infusion (</= '80mg'$ /m2), 80 mg/m2 = 150 mg, Intravenous,  Once, 1 of 4 cycles Administration: 150 mg (08/18/2019), 150 mg (09/08/2019), 150 mg (09/15/2019)  for chemotherapy treatment.    10/13/2019 -  Chemotherapy   The patient had DOXOrubicin (ADRIAMYCIN) chemo injection 38 mg, 20 mg/m2 = 38 mg, Intravenous,  Once, 3 of 7 cycles Administration: 38 mg (10/13/2019), 38 mg (10/20/2019) palonosetron (ALOXI) injection 0.25 mg, 0.25 mg, Intravenous,  Once, 3 of 7 cycles Administration: 0.25 mg (10/13/2019), 0.25 mg (10/20/2019)  for chemotherapy treatment.      INTERVAL HISTORY:  Sarah Horne 64 y.o.  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  Patient complains of difficulty swallowing both to solids and liquids over the last 1 week.  She feels she is gagging on food.  Patient has been recently treated for oral thrush.   Otherwise denies any unusual nausea vomiting.  Appetite is fair.  No weight loss.  Chronic tingling and numbness in extremities.    Review of Systems  Constitutional: Positive for malaise/fatigue. Negative for chills, diaphoresis, fever and weight loss.  HENT: Negative for nosebleeds and sore throat.   Eyes: Negative for double vision.  Respiratory: Negative for cough, hemoptysis, sputum production, shortness of breath and wheezing.   Cardiovascular: Negative for chest pain, palpitations, orthopnea and leg swelling.  Gastrointestinal: Negative for abdominal pain, blood in  stool, constipation, heartburn, melena, nausea and vomiting.  Genitourinary: Negative for dysuria, frequency and urgency.  Musculoskeletal: Positive for joint pain. Negative for back pain.  Skin: Negative for itching.  Neurological: Positive for tingling. Negative for dizziness, focal weakness, weakness and headaches.  Endo/Heme/Allergies: Does not bruise/bleed easily.  Psychiatric/Behavioral: Negative for depression. The patient is not nervous/anxious and does not have insomnia.     PAST MEDICAL HISTORY :  Past Medical History:  Diagnosis Date  . Chemotherapy-induced peripheral neuropathy ([place])   . Epistaxis    a. 11/2016 in setting of asa/plavix-->silver nitrate cauterization.  . GI bleed    a. 11/2016 Admission w/ GIB and hypovolemic shock req 3u PRBC's;  b. 11/2016 ECG: gastritis & nonbleeding peptic ulcer; c. 11/2016 Conlonoscopy: rectal and sigmoid colonic ulcers.  . Heart attack ([place])    a. 1998 Cath @ [facility]: reportedly no intervention required.  Marland Kitchen Herceptin-induced cardiomyopathy ([place])    a. In the setting of Herceptin Rx for breast cancer (initiated 12/2014); b. 03/2015 MUGA EF 64%; b. 08/2015 MUGA: EF 51%; c. 10/2015 MUGA: EF 44%; d. 11/2015 Echo: EF 45-50%; e. 01/2016 MUGA: EF 60%; f. 06/2016 MUGA EF 65%; g. 10/2016 MUGA: EF 61%;  h. 12/2016 Echo: EF 55-60%, gr1 DD.  Marland Kitchen Hyperlipidemia   . Hypertension   . Neuropathy   . Possible PAD (peripheral artery disease) ([place])    a. 11/2016 LE cyanosis and weak pulses-->CTA w/o significant Ao-BiFem dzs. ? distal dzs-->ASA/Plavix  initiated by vascular surgery.  Marland Kitchen PSVT (paroxysmal supraventricular tachycardia) ([place])    a. Dx 11/2016.  . Pulmonary embolism ([place])    a. 12/2016 CTA Chest: small nonocclusive PE in inferior segment of the Left lingula, somewhat eccentric filling defect suggesting chronic rather than acute embolic event; b. 11/2120 LE U/S:  No DVT; c. 12/2016 Echo: Nl RV fxn, nl PASP.  Marland Kitchen Recurrent Metastatic breast cancer ([place])    a. Dx 2016:  Stage II, ER positive, PR positive, HER-2/neu overexpressing of the left breast-->chemo/radiation; b. 10/2016 CT Abd/pelvis: diffuse liver mets, ill defined sclerotic bone lesions-T12;  c. 10/2016 MRI brain: metastatic lesion along L temporal lobe (19x58m) w/ extensive surrounding edema & 516mmidline shift to right.  . Sepsis ([place]  . Sinus tachycardia     PAST SURGICAL HISTORY :   Past Surgical History:  Procedure Laterality Date  . BREAST BIOPSY Left 2016   Positive  . BREAST LUMPECTOMY WITH SENTINEL LYMPH NODE BIOPSY Left 05/23/2015   Procedure: LEFT BREAST WIDE EXCISION WITH AXILLARY DISSECTION, MASTOPLASTY ;  Surgeon: JeRobert BellowMD;  Location: [facility] ORS;  Service: General;  Laterality: Left;  . BREAST SURGERY Left 12/18/14   breast biopsy/INVASIVE DUCTAL CARCINOMA OF BREAST, NOTTINGHAM GRADE 2.  . Marland KitchenREAST SURGERY  05/23/2015.   Wide excision/mastoplasty, axillary dissection. No residual invasive cancer, positive for residual DCIS. 0/2 nodes identified on axillary dissection. (no SLN by technetium or methylene blue)  . CARDIAC CATHETERIZATION    . COLONOSCOPY WITH PROPOFOL N/A 12/10/2016   Procedure: COLONOSCOPY WITH PROPOFOL;  Surgeon: DaLucilla LameMD;  Location: [facility] ENDOSCOPY;  Service: Endoscopy;  Laterality: N/A;  . COLONOSCOPY WITH PROPOFOL N/A 02/13/2017   Procedure: COLONOSCOPY WITH PROPOFOL;  Surgeon: WoLucilla LameMD;  [facility];  Service: Endoscopy;  Laterality: N/A;  . ESOPHAGOGASTRODUODENOSCOPY (EGD) WITH PROPOFOL N/A 12/08/2016   Procedure: ESOPHAGOGASTRODUODENOSCOPY (EGD) WITH PROPOFOL;  Surgeon: DaLucilla LameMD;  Location: [facility] ENDOSCOPY;  Service: Endoscopy;  Laterality: N/A;  . IVC FILTER INSERTION N/A 02/15/2017   Procedure: IVC Filter Insertion;  Surgeon: DeAlgernon HuxleyMD;  [facility];  Service: Cardiovascular;  Laterality: N/A;  . PORTACATH PLACEMENT Right 12-31-14   Dr ByBary Castilla  FAMILY HISTORY :   Family History  Problem  Relation Age of Onset  . Breast cancer Maternal Aunt   . Breast cancer Cousin   . Brain cancer Maternal Uncle   . Diabetes Mother   . Hypertension Mother   . Stroke Mother     SOCIAL HISTORY:   Social History   Tobacco Use  . Smoking status: Light Tobacco Smoker    Packs/day: 0.50    Years: 18.00    Pack years: 9.00    Types: Cigarettes  . Smokeless tobacco: Never Used  Substance Use Topics  . Alcohol use: No    Alcohol/week: 0.0 standard drinks  . Drug use: No    ALLERGIES:  is allergic to no known allergies.  MEDICATIONS:  Current Outpatient Medications  Medication Sig Dispense Refill  . flecainide (TAMBOCOR) 50 MG tablet Take 1 tablet (50 mg total) by mouth 2 (two) times daily. 60 tablet 4  . ipratropium (ATROVENT) 0.03 % nasal spray Place 2 sprays into both nostrils every 12 (twelve) hours. 30 mL 12  . levocetirizine (XYZAL) 5 MG tablet Take 1 tablet (5 mg total) by mouth every evening. 30 tablet 2  . magnesium oxide (MAG-OX) 400 MG tablet Take 1 tablet (400 mg  total) by mouth 2 (two) times daily. 60 tablet 5  . meclizine (ANTIVERT) 25 MG tablet Take 1 tablet (25 mg total) by mouth 3 (three) times daily as needed for dizziness. 30 tablet 0  . metoprolol tartrate (LOPRESSOR) 25 MG tablet Take 1 tablet (25 mg total) by mouth 2 (two) times daily. 60 tablet 1  . montelukast (SINGULAIR) 10 MG tablet Take 1 tablet (10 mg total) by mouth at bedtime. 60 tablet 3  . pregabalin (LYRICA) 100 MG capsule Take 1 capsule (100 mg total) by mouth 2 (two) times daily. 60 capsule 2  . Vitamin D, Ergocalciferol, (DRISDOL) 1.25 MG (50000 UNIT) CAPS capsule TAKE 1 CAPSULE (50,000 UNITS TOTAL) ONCE A WEEK BY MOUTH. 12 capsule 1  . fluconazole (DIFLUCAN) 200 MG tablet Take 1 tablet (200 mg total) by mouth daily. 14 tablet 0   No current facility-administered medications for this visit.   Facility-Administered Medications Ordered in Other Visits  Medication Dose Route Frequency Provider Last  Rate Last Admin  . 0.9 %  sodium chloride infusion   Intravenous Continuous [name], MD   New Bag at 12/10/16 1140  . 0.9 %  sodium chloride infusion   Intravenous Continuous Lloyd Huger, MD 999 mL/hr at 04/24/15 1520 New Bag at 05/23/15 0951  . heparin lock flush 100 unit/mL  500 Units Intravenous Once Berenzon, Dmitriy, MD      . heparin lock flush 100 unit/mL  500 Units Intracatheter Once PRN Charlaine Dalton R, MD      . sodium chloride 0.9 % injection 10 mL  10 mL Intravenous PRN [name], MD   10 mL at 04/04/15 1440  . sodium chloride flush (NS) 0.9 % injection 10 mL  10 mL Intravenous PRN Berenzon, Dmitriy, MD      . sodium chloride flush (NS) 0.9 % injection 10 mL  10 mL Intracatheter PRN Cammie Sickle, MD        PHYSICAL EXAMINATION: ECOG PERFORMANCE STATUS: 1 - Symptomatic but completely ambulatory  BP 119/81 (BP Location: Right Arm, Patient Position: Sitting)   Pulse (!) 108   Temp (!) 97 F (36.1 C) (Tympanic)   Resp 20   Ht '5\' 6"'$  (1.676 m)   Wt 159 lb (72.1 kg)   BMI 25.66 kg/m   Filed Weights   10/27/19 0923  Weight: 159 lb (72.1 kg)    Physical Exam  Constitutional: She is oriented to person, place, and time and well-developed, well-nourished, and in no distress.  Alone.  Walking herself.  HENT:  Head: Normocephalic and atraumatic.  Mouth/Throat: Oropharynx is clear and moist. No oropharyngeal exudate.  Oral thrush.   Eyes: Pupils are equal, round, and reactive to light.  Cardiovascular: Normal rate and regular rhythm.  Pulmonary/Chest: Effort normal and breath sounds normal. No respiratory distress. She has no wheezes.  Abdominal: Soft. Bowel sounds are normal. She exhibits no distension and no mass. There is no abdominal tenderness. There is no rebound and no guarding.  Musculoskeletal:        General: No tenderness or edema. Normal range of motion.     Cervical back: Normal range of motion and neck supple.  Neurological:  She is alert and oriented to person, place, and time.  Skin: Skin is warm.  Psychiatric: Affect normal.    LABORATORY DATA:  I have reviewed the data as listed    Component Value Date/Time   NA 136 10/27/2019 0857   NA 135 03/04/2017 5625  NA 135 01/08/2015 0850   K 3.8 10/27/2019 0857   K 3.7 01/08/2015 0850   CL 102 10/27/2019 0857   CL 101 01/08/2015 0850   CO2 26 10/27/2019 0857   CO2 26 01/08/2015 0850   GLUCOSE 87 10/27/2019 0857   GLUCOSE 161 (H) 01/08/2015 0850   BUN 13 10/27/2019 0857   BUN 8 03/04/2017 0918   BUN 17 01/08/2015 0850   CREATININE 0.71 10/27/2019 0857   CREATININE 0.82 01/22/2015 1559   CALCIUM 8.8 (L) 10/27/2019 0857   CALCIUM 9.3 01/08/2015 0850   PROT 7.0 10/27/2019 0857   PROT 6.4 03/04/2017 0918   PROT 6.8 01/22/2015 1559   ALBUMIN 3.5 10/27/2019 0857   ALBUMIN 2.5 (L) 03/04/2017 0918   ALBUMIN 3.9 01/22/2015 1559   AST 67 (H) 10/27/2019 0857   AST 34 01/22/2015 1559   ALT 85 (H) 10/27/2019 0857   ALT 42 01/22/2015 1559   ALKPHOS 280 (H) 10/27/2019 0857   ALKPHOS 157 (H) 01/22/2015 1559   BILITOT 1.4 (H) 10/27/2019 0857   BILITOT 6.2 (H) 03/04/2017 0918   BILITOT 0.2 (L) 01/22/2015 1559   GFRNONAA >60 10/27/2019 0857   GFRNONAA >60 01/22/2015 1559   GFRAA >60 10/27/2019 0857   GFRAA >60 01/22/2015 1559    No results found for: SPEP, UPEP  Lab Results  Component Value Date   WBC 2.5 (L) 10/27/2019   NEUTROABS 1.7 10/27/2019   HGB 12.1 10/27/2019   HCT 38.1 10/27/2019   MCV 98.4 10/27/2019   PLT 202 10/27/2019      Chemistry      Component Value Date/Time   NA 136 10/27/2019 0857   NA 135 03/04/2017 0918   NA 135 01/08/2015 0850   K 3.8 10/27/2019 0857   K 3.7 01/08/2015 0850   CL 102 10/27/2019 0857   CL 101 01/08/2015 0850   CO2 26 10/27/2019 0857   CO2 26 01/08/2015 0850   BUN 13 10/27/2019 0857   BUN 8 03/04/2017 0918   BUN 17 01/08/2015 0850   CREATININE 0.71 10/27/2019 0857   CREATININE 0.82 01/22/2015 1559       Component Value Date/Time   CALCIUM 8.8 (L) 10/27/2019 0857   CALCIUM 9.3 01/08/2015 0850   ALKPHOS 280 (H) 10/27/2019 0857   ALKPHOS 157 (H) 01/22/2015 1559   AST 67 (H) 10/27/2019 0857   AST 34 01/22/2015 1559   ALT 85 (H) 10/27/2019 0857   ALT 42 01/22/2015 1559   BILITOT 1.4 (H) 10/27/2019 0857   BILITOT 6.2 (H) 03/04/2017 0918   BILITOT 0.2 (L) 01/22/2015 1559       RADIOGRAPHIC STUDIES: I have personally reviewed the radiological images as listed and agreed with the findings in the report. No results found.   ASSESSMENT & PLAN:  Carcinoma of upper-outer quadrant of left breast in female, estrogen receptor positive ([place]) # Recurrent metastatic breast cancer-ER PR positive HER-2 negative; NOV 2020- PROGRESSION in liver- multiple enhancing lesions noted.  On ADRIAMYCIN weekly.  # Proceed with Adriamycin weekly dose cycle #3 today. Labs today reviewed;  acceptable for treatment today-however LFTs elevated.   # Dysphagia- ? Thrush vs others; awaiting GI evaluation. Check esophagogram; start dflucan 200 mg/day x 14 days. Decrease the steroids with chemo.  # Sinus tachycardia- on metoprolol-/Tamabcor- STABLE.   # Elevated LFTs-likely secondary to progressive malignancy;overall stable-monitor closely.   # Brain metastases; Recurrent-s/p RT finshed 11/17.  MRI- improved; STOP dexamethasone today. Stable.   # Peripheral neuropathy grade  1-2-  on Lyrica; STABLE.   # Bil LE swelling- recommend compression stockings.   # DISPOSITION:  # STAT esophagogram # Adriamycin today.Marland Kitchen  # 1 week- MD; labs- cbc/cmp; ; adriamycin weekly- Dr.B   Orders Placed This Encounter  Procedures  . DG ESOPHAGUS W SINGLE CM (SOL OR THIN BA)    Standing Status:   Future    Standing Expiration Date:   12/26/2020    Order Specific Question:   Reason for Exam (SYMPTOM  OR DIAGNOSIS REQUIRED)    Answer:   dysphagia    Order Specific Question:   Preferred imaging location?    Answer:   [facility]   All questions were answered. The patient knows to call the clinic with any problems, questions or concerns.     Cammie Sickle, MD 10/27/2019 10:28 AM

## 2019-10-27 NOTE — Assessment & Plan Note (Addendum)
#  Recurrent metastatic breast cancer-ER PR positive HER-2 negative; NOV 2020- PROGRESSION in liver- multiple enhancing lesions noted.  On ADRIAMYCIN weekly.  # Proceed with Adriamycin weekly dose cycle #3 today. Labs today reviewed;  acceptable for treatment today-however LFTs elevated.   # Dysphagia- ? Thrush vs others; awaiting GI evaluation. Check esophagogram; start dflucan 200 mg/day x 14 days. Decrease the steroids with chemo.  # Sinus tachycardia- on metoprolol-/Tamabcor- STABLE.   # Elevated LFTs-likely secondary to progressive malignancy;overall stable-monitor closely.   # Brain metastases; Recurrent-s/p RT finshed 11/17.  MRI- improved; STOP dexamethasone today. Stable.   # Peripheral neuropathy grade 1-2-  on Lyrica; STABLE.   # Bil LE swelling- recommend compression stockings.   # DISPOSITION:  # STAT esophagogram # Adriamycin today.Marland Kitchen  # 1 week- MD; labs- cbc/cmp; ; adriamycin weekly- Dr.B

## 2019-10-30 ENCOUNTER — Telehealth: Payer: Self-pay | Admitting: Internal Medicine

## 2019-10-30 NOTE — Telephone Encounter (Signed)
On 2/1-spoke to patient regarding results of the esophagogram; suggestive of benign Schatzki's ring.  Await evaluation with GI; discussed that she will likely need EGD.

## 2019-10-31 ENCOUNTER — Other Ambulatory Visit: Payer: Self-pay

## 2019-10-31 ENCOUNTER — Other Ambulatory Visit: Payer: Self-pay | Admitting: *Deleted

## 2019-10-31 ENCOUNTER — Inpatient Hospital Stay: Payer: Medicare HMO

## 2019-10-31 ENCOUNTER — Inpatient Hospital Stay (HOSPITAL_BASED_OUTPATIENT_CLINIC_OR_DEPARTMENT_OTHER): Payer: Medicare HMO | Admitting: Nurse Practitioner

## 2019-10-31 ENCOUNTER — Inpatient Hospital Stay: Payer: Medicare HMO | Attending: Nurse Practitioner

## 2019-10-31 ENCOUNTER — Telehealth: Payer: Self-pay | Admitting: *Deleted

## 2019-10-31 ENCOUNTER — Ambulatory Visit: Payer: Medicare HMO | Admitting: Gastroenterology

## 2019-10-31 VITALS — BP 94/61 | HR 85 | Temp 97.5°F | Resp 17 | Wt 162.7 lb

## 2019-10-31 DIAGNOSIS — D701 Agranulocytosis secondary to cancer chemotherapy: Secondary | ICD-10-CM | POA: Diagnosis not present

## 2019-10-31 DIAGNOSIS — Z5111 Encounter for antineoplastic chemotherapy: Secondary | ICD-10-CM | POA: Insufficient documentation

## 2019-10-31 DIAGNOSIS — R945 Abnormal results of liver function studies: Secondary | ICD-10-CM

## 2019-10-31 DIAGNOSIS — G629 Polyneuropathy, unspecified: Secondary | ICD-10-CM | POA: Diagnosis not present

## 2019-10-31 DIAGNOSIS — Z86711 Personal history of pulmonary embolism: Secondary | ICD-10-CM | POA: Diagnosis not present

## 2019-10-31 DIAGNOSIS — Z86718 Personal history of other venous thrombosis and embolism: Secondary | ICD-10-CM | POA: Diagnosis not present

## 2019-10-31 DIAGNOSIS — C7931 Secondary malignant neoplasm of brain: Secondary | ICD-10-CM | POA: Insufficient documentation

## 2019-10-31 DIAGNOSIS — T451X5A Adverse effect of antineoplastic and immunosuppressive drugs, initial encounter: Secondary | ICD-10-CM

## 2019-10-31 DIAGNOSIS — R531 Weakness: Secondary | ICD-10-CM | POA: Insufficient documentation

## 2019-10-31 DIAGNOSIS — M8580 Other specified disorders of bone density and structure, unspecified site: Secondary | ICD-10-CM | POA: Diagnosis not present

## 2019-10-31 DIAGNOSIS — R5383 Other fatigue: Secondary | ICD-10-CM | POA: Diagnosis not present

## 2019-10-31 DIAGNOSIS — Z170 Estrogen receptor positive status [ER+]: Secondary | ICD-10-CM

## 2019-10-31 DIAGNOSIS — Z79818 Long term (current) use of other agents affecting estrogen receptors and estrogen levels: Secondary | ICD-10-CM | POA: Diagnosis not present

## 2019-10-31 DIAGNOSIS — R7989 Other specified abnormal findings of blood chemistry: Secondary | ICD-10-CM | POA: Insufficient documentation

## 2019-10-31 DIAGNOSIS — I10 Essential (primary) hypertension: Secondary | ICD-10-CM | POA: Diagnosis not present

## 2019-10-31 DIAGNOSIS — Z5189 Encounter for other specified aftercare: Secondary | ICD-10-CM

## 2019-10-31 DIAGNOSIS — Z7901 Long term (current) use of anticoagulants: Secondary | ICD-10-CM | POA: Insufficient documentation

## 2019-10-31 DIAGNOSIS — I82401 Acute embolism and thrombosis of unspecified deep veins of right lower extremity: Secondary | ICD-10-CM | POA: Diagnosis not present

## 2019-10-31 DIAGNOSIS — C50412 Malignant neoplasm of upper-outer quadrant of left female breast: Secondary | ICD-10-CM | POA: Diagnosis not present

## 2019-10-31 DIAGNOSIS — F17210 Nicotine dependence, cigarettes, uncomplicated: Secondary | ICD-10-CM | POA: Diagnosis not present

## 2019-10-31 DIAGNOSIS — Z79899 Other long term (current) drug therapy: Secondary | ICD-10-CM | POA: Insufficient documentation

## 2019-10-31 DIAGNOSIS — Z9221 Personal history of antineoplastic chemotherapy: Secondary | ICD-10-CM | POA: Insufficient documentation

## 2019-10-31 DIAGNOSIS — R000 Tachycardia, unspecified: Secondary | ICD-10-CM | POA: Insufficient documentation

## 2019-10-31 DIAGNOSIS — D72819 Decreased white blood cell count, unspecified: Secondary | ICD-10-CM | POA: Insufficient documentation

## 2019-10-31 DIAGNOSIS — J029 Acute pharyngitis, unspecified: Secondary | ICD-10-CM | POA: Insufficient documentation

## 2019-10-31 DIAGNOSIS — Z95828 Presence of other vascular implants and grafts: Secondary | ICD-10-CM

## 2019-10-31 DIAGNOSIS — E785 Hyperlipidemia, unspecified: Secondary | ICD-10-CM | POA: Insufficient documentation

## 2019-10-31 LAB — CBC WITH DIFFERENTIAL/PLATELET
Abs Immature Granulocytes: 0.02 10*3/uL (ref 0.00–0.07)
Basophils Absolute: 0 10*3/uL (ref 0.0–0.1)
Basophils Relative: 0 %
Eosinophils Absolute: 0 10*3/uL (ref 0.0–0.5)
Eosinophils Relative: 0 %
HCT: 37.4 % (ref 36.0–46.0)
Hemoglobin: 11.9 g/dL — ABNORMAL LOW (ref 12.0–15.0)
Immature Granulocytes: 1 %
Lymphocytes Relative: 18 %
Lymphs Abs: 0.6 10*3/uL — ABNORMAL LOW (ref 0.7–4.0)
MCH: 31.6 pg (ref 26.0–34.0)
MCHC: 31.8 g/dL (ref 30.0–36.0)
MCV: 99.2 fL (ref 80.0–100.0)
Monocytes Absolute: 0 10*3/uL — ABNORMAL LOW (ref 0.1–1.0)
Monocytes Relative: 1 %
Neutro Abs: 2.4 10*3/uL (ref 1.7–7.7)
Neutrophils Relative %: 80 %
Platelets: 192 10*3/uL (ref 150–400)
RBC: 3.77 MIL/uL — ABNORMAL LOW (ref 3.87–5.11)
RDW: 18.9 % — ABNORMAL HIGH (ref 11.5–15.5)
WBC: 3.1 10*3/uL — ABNORMAL LOW (ref 4.0–10.5)
nRBC: 3.6 % — ABNORMAL HIGH (ref 0.0–0.2)

## 2019-10-31 LAB — COMPREHENSIVE METABOLIC PANEL
ALT: 61 U/L — ABNORMAL HIGH (ref 0–44)
AST: 43 U/L — ABNORMAL HIGH (ref 15–41)
Albumin: 3.8 g/dL (ref 3.5–5.0)
Alkaline Phosphatase: 261 U/L — ABNORMAL HIGH (ref 38–126)
Anion gap: 14 (ref 5–15)
BUN: 27 mg/dL — ABNORMAL HIGH (ref 8–23)
CO2: 22 mmol/L (ref 22–32)
Calcium: 9.1 mg/dL (ref 8.9–10.3)
Chloride: 97 mmol/L — ABNORMAL LOW (ref 98–111)
Creatinine, Ser: 1.23 mg/dL — ABNORMAL HIGH (ref 0.44–1.00)
GFR calc Af Amer: 53 mL/min — ABNORMAL LOW (ref >60)
GFR calc non Af Amer: 46 mL/min — ABNORMAL LOW (ref >60)
Glucose, Bld: 119 mg/dL — ABNORMAL HIGH (ref 70–99)
Potassium: 4.6 mmol/L (ref 3.5–5.1)
Sodium: 133 mmol/L — ABNORMAL LOW (ref 135–145)
Total Bilirubin: 2.2 mg/dL — ABNORMAL HIGH (ref 0.3–1.2)
Total Protein: 7.5 g/dL (ref 6.5–8.1)

## 2019-10-31 LAB — SAMPLE TO BLOOD BANK

## 2019-10-31 MED ORDER — SODIUM CHLORIDE 0.9% FLUSH
10.0000 mL | Freq: Once | INTRAVENOUS | Status: AC
  Administered 2019-10-31: 10 mL via INTRAVENOUS
  Filled 2019-10-31: qty 10

## 2019-10-31 MED ORDER — SODIUM CHLORIDE 0.9 % IV SOLN
INTRAVENOUS | Status: AC
  Filled 2019-10-31 (×2): qty 250

## 2019-10-31 NOTE — Telephone Encounter (Signed)
Labs entered per md order 

## 2019-10-31 NOTE — Progress Notes (Signed)
Symptom Management [place]  [phone] Fax:([phone]) [phone]  Patient Care Team: Sarah Grana, PA-C as PCP - General (Family Medicine) Sarah Hampshire, MD as PCP - Cardiology (Cardiology) Sarah Horne, Sarah Gleason, MD (General Surgery) Sarah Alf, MD (Inactive) as Attending Physician (Internal Medicine)   Name of the patient: Sarah Horne  [id number]  10/29/1955   Date of visit: 10/31/19  Diagnosis-metastatic breast cancer  Chief complaint/ Reason for visit-fatigue, weakness, poor oral intake  Heme/Onc history:  Oncology History Overview Note  # LEFT BREAST IDC; STAGE II [cT2N1] ER >90%; PR- 50-90%; her 2 NEU- POS; s/p Neoadj chemo; AUG 2016- TCH+P s/p Lumpec & partial ALND- path CR; s/p RT [finished Nov 2016];  adj Herceptin; HELD for Jan 19th 2017 [in DEC 2016-EF dropped from 63 to 51%; FEB 27th EF-42.9%]; JAN 2016 START Arimidex; May 2017- EF- 60%; May 24th 2017-Re-start Herceptin q 3W; July 28th STOP herceptin [finished 11m/m2; sec to Low EF; however 2017 Oct EF= 67%; improved]  # DEC 4th 2017- Start Neratinib 4 pills; DEC 11th 3 pills; STOPPED.  # FEB 2018- RECURRENCE ER/PR positive; Her 2 NEGATIVE [liver Bx]  # MARCH 1st 2018- Tax-Cytoxan [poor tol]  # FEB 2018- Brain mets [SBRT; Dr.Crystal; finished Feb 28th 2018]  # March 2018- Eribulin- poor tolerance  # June 8th 2018- Started Faslodex + Abema [abema-multiple interruptions sec to neutropenia]  # MRI Brain- March 2019- Leptomeningeal mets [WBRT; No LP s/p WBRT- 01/09/2018]; STOP Abema [559mday-sec to severe cytopenia]+faslodex  # MAY 20tth 019- Start Xeloda; STOPPED in Nov 22nd 2019- sec to Progressive liver lesions on CT scan  # NOV 26th 2019- Faslodex + Piqaray 250101m; Jan 2020- Piqray 300 mg+ faslodex; FEB 17th CT scan- mixed response/ Overall progression; STOP Piqray + faslodex.   # FEB 25th 2020- ERIBULIN; Poor tolerance sec to severe neutropenia.  Stopped May  2020.  CT scan May 2020-new/progressive 2 cm liver lesion;   #July nd 2020-Ixempra x5 cycles-; NOV 2020-MRI liver- worsening liver lesions [rising AST/ALT].   # NOV [id number] weekly [consent-]; December 31-2020-progression/rising LFTs.  Stop Taxol  #January 15th, 2021-Adriamycin weekly [C]  #May 06, 2019-MRI brain 5 new lesions [3 mm to 10 mm]-asymptomatic; [previous whole brain radiation]- OCT 2020- Re-Irradiation.   # PN- G-2; may 2017- Cymbalta 56m56mMARCH 2018-  acute vascular insuff of BIL LE [? Taxotere vasoconstriction on asprin]; # hemorragic shock LGIB [s/p colo; Dr.Wohl]  #  SVT- on flecainide.   # April 2016- Liver Bx- NEG;   # Drop in EF from Herceptin [recovered OCt 27th- EF 65%]  # BMD- jan 2017- osteopenia  # 2020- SEP-s/p Pallaitive care evaluation  -----------------------------------------------------------     MOLEVenedocia- Sep 4th 2018- PDL-1 0%/NEG for BRCA; MSI-Stable; Positive for PI3K; ccnd-1; FGF** -----------------------------------------------------------  Dx: Metastatic Breast cancer [ER/PR-Pos; her -2 NEG] Stage IV; goals: palliative Current treatment-Adriamycin [C]   Carcinoma of upper-outer quadrant of left breast in female, estrogen receptor positive (HCC)[place]/25/2020 - 01/17/2019 Chemotherapy   The patient had eriBULin mesylate (HALAVEN) 2.1 mg in sodium chloride 0.9 % 100 mL chemo infusion, 1.2 mg/m2 = 2.1 mg (100 % of original dose 1.2 mg/m2), Intravenous,  Once, 3 of 4 cycles Dose modification: 1.2 mg/m2 (original dose 1.2 mg/m2, Cycle 1, Reason: Provider Judgment), 0.8 mg/m2 (original dose 1.2 mg/m2, Cycle 3, Reason: Provider Judgment) Administration: 2.1 mg (11/22/2018), 2.1 mg (11/29/2018), 2.1 mg (12/20/2018), 1.4 mg (01/17/2019)  for chemotherapy treatment.  03/30/2019 - 07/12/2019 Chemotherapy   The patient had pegfilgrastim (NEULASTA ONPRO KIT) injection 6 mg, 6 mg, Subcutaneous, Once, 5 of 8  cycles Administration: 6 mg (03/30/2019), 6 mg (04/20/2019), 6 mg (05/11/2019), 6 mg (06/22/2019), 6 mg (06/01/2019) ixabepilone (IXEMPRA) 60 mg in lactated ringers 250 mL chemo infusion, 33.5 mg/m2 = 57 mg (100 % of original dose 32 mg/m2), Intravenous,  Once, 5 of 8 cycles Dose modification: 32 mg/m2 (original dose 32 mg/m2, Cycle 1, Reason: Provider Judgment) Administration: 72 mg (04/20/2019), 72 mg (05/11/2019), 72 mg (06/22/2019), 72 mg (06/01/2019)  for chemotherapy treatment.    08/18/2019 - 09/28/2019 Chemotherapy   The patient had PACLitaxel (TAXOL) 150 mg in sodium chloride 0.9 % 250 mL chemo infusion (</= 9m/m2), 80 mg/m2 = 150 mg, Intravenous,  Once, 1 of 4 cycles Administration: 150 mg (08/18/2019), 150 mg (09/08/2019), 150 mg (09/15/2019)  for chemotherapy treatment.    10/13/2019 -  Chemotherapy   The patient had DOXOrubicin (ADRIAMYCIN) chemo injection 38 mg, 20 mg/m2 = 38 mg, Intravenous,  Once, 3 of 7 cycles Administration: 38 mg (10/13/2019), 38 mg (10/20/2019), 38 mg (10/27/2019) palonosetron (ALOXI) injection 0.25 mg, 0.25 mg, Intravenous,  Once, 3 of 7 cycles Administration: 0.25 mg (10/13/2019), 0.25 mg (10/20/2019), 0.25 mg (10/27/2019)  for chemotherapy treatment.      Interval history- BTanganika Horne 64year old female diagnosed with recurrent metastatic breast cancer currently on weekly Adriamycin, who presents to symptom management clinic for fatigue and sore throat.  She says that she woke this morning and has felt fatigued all day.  Says she has been drinking very little over the last 48 hours and has a sore throat.  Symptoms do not seem to improve with rest.  Has not taken medications for symptoms.  Says she feels like she needs IV fluids.  Was recently treated for oral thrush.  Has had history of difficulty swallowing solids and liquids previously.  Appetite was fair but weight was stable.  She had appointment with GI this morning for esophagram but says she slept through it and  has to reschedule.  She is currently taking Diflucan.  On steroids chronically for brain metastases though decreased with chemo.  No nausea or vomiting.  No diarrhea constipation.  No urinary complaints.  No fevers or chills.  No cough or congestion.  ECOG FS:3 - Symptomatic, >50% confined to bed  Review of systems- Review of Systems  Constitutional: Positive for malaise/fatigue. Negative for chills, fever and weight loss.  HENT: Positive for sore throat. Negative for hearing loss, nosebleeds and tinnitus.   Eyes: Negative for blurred vision and double vision.  Respiratory: Negative for cough, hemoptysis, shortness of breath and wheezing.   Cardiovascular: Negative for chest pain, palpitations and leg swelling.  Gastrointestinal: Negative for abdominal pain, blood in stool, constipation, diarrhea, melena, nausea and vomiting.       Poor oral intake  Genitourinary: Negative for dysuria and urgency.  Musculoskeletal: Positive for joint pain (Chronic). Negative for back pain, falls and myalgias.  Skin: Negative for itching and rash.  Neurological: Positive for weakness. Negative for dizziness, tingling, sensory change, loss of consciousness and headaches.  Endo/Heme/Allergies: Negative for environmental allergies. Does not bruise/bleed easily.  Psychiatric/Behavioral: Negative for depression. The patient is not nervous/anxious and does not have insomnia.      Current treatment-weekly Adriamycin  Allergies  Allergen Reactions  . No Known Allergies     Past Medical History:  Diagnosis Date  . Chemotherapy-induced peripheral neuropathy ([place]   .  Epistaxis    a. 11/2016 in setting of asa/plavix-->silver nitrate cauterization.  . GI bleed    a. 11/2016 Admission w/ GIB and hypovolemic shock req 3u PRBC's;  b. 11/2016 ECG: gastritis & nonbleeding peptic ulcer; c. 11/2016 Conlonoscopy: rectal and sigmoid colonic ulcers.  . Heart attack ([place])    a. 1998 Cath @ [facility]: reportedly no intervention  required.  Marland Kitchen Herceptin-induced cardiomyopathy ([place])    a. In the setting of Herceptin Rx for breast cancer (initiated 12/2014); b. 03/2015 MUGA EF 64%; b. 08/2015 MUGA: EF 51%; c. 10/2015 MUGA: EF 44%; d. 11/2015 Echo: EF 45-50%; e. 01/2016 MUGA: EF 60%; f. 06/2016 MUGA EF 65%; g. 10/2016 MUGA: EF 61%;  h. 12/2016 Echo: EF 55-60%, gr1 DD.  Marland Kitchen Hyperlipidemia   . Hypertension   . Neuropathy   . Possible PAD (peripheral artery disease) ([place])    a. 11/2016 LE cyanosis and weak pulses-->CTA w/o significant Ao-BiFem dzs. ? distal dzs-->ASA/Plavix initiated by vascular surgery.  Marland Kitchen PSVT (paroxysmal supraventricular tachycardia) ([place])    a. Dx 11/2016.  . Pulmonary embolism ([place])    a. 12/2016 CTA Chest: small nonocclusive PE in inferior segment of the Left lingula, somewhat eccentric filling defect suggesting chronic rather than acute embolic event; b. 02/7892 LE U/S:  No DVT; c. 12/2016 Echo: Nl RV fxn, nl PASP.  Marland Kitchen Recurrent Metastatic breast cancer ([place])    a. Dx 2016: Stage II, ER positive, PR positive, HER-2/neu overexpressing of the left breast-->chemo/radiation; b. 10/2016 CT Abd/pelvis: diffuse liver mets, ill defined sclerotic bone lesions-T12;  c. 10/2016 MRI brain: metastatic lesion along L temporal lobe (19x59m) w/ extensive surrounding edema & 565mmidline shift to right.  . Sepsis ([place]  . Sinus tachycardia     Past Surgical History:  Procedure Laterality Date  . BREAST BIOPSY Left 2016   Positive  . BREAST LUMPECTOMY WITH SENTINEL LYMPH NODE BIOPSY Left 05/23/2015   Procedure: LEFT BREAST WIDE EXCISION WITH AXILLARY DISSECTION, MASTOPLASTY ;  Surgeon: JeRobert BellowMD;  Location: [facility] ORS;  Service: General;  Laterality: Left;  . BREAST SURGERY Left 12/18/14   breast biopsy/INVASIVE DUCTAL CARCINOMA OF BREAST, NOTTINGHAM GRADE 2.  . Marland KitchenREAST SURGERY  05/23/2015.   Wide excision/mastoplasty, axillary dissection. No residual invasive cancer, positive for residual DCIS. 0/2 nodes identified on  axillary dissection. (no SLN by technetium or methylene blue)  . CARDIAC CATHETERIZATION    . COLONOSCOPY WITH PROPOFOL N/A 12/10/2016   Procedure: COLONOSCOPY WITH PROPOFOL;  Surgeon: DaLucilla LameMD;  Location: [facility] ENDOSCOPY;  Service: Endoscopy;  Laterality: N/A;  . COLONOSCOPY WITH PROPOFOL N/A 02/13/2017   Procedure: COLONOSCOPY WITH PROPOFOL;  Surgeon: WoLucilla LameMD;  [facility];  Service: Endoscopy;  Laterality: N/A;  . ESOPHAGOGASTRODUODENOSCOPY (EGD) WITH PROPOFOL N/A 12/08/2016   Procedure: ESOPHAGOGASTRODUODENOSCOPY (EGD) WITH PROPOFOL;  Surgeon: DaLucilla LameMD;  Location: [facility] ENDOSCOPY;  Service: Endoscopy;  Laterality: N/A;  . IVC FILTER INSERTION N/A 02/15/2017   Procedure: IVC Filter Insertion;  Surgeon: DeAlgernon HuxleyMD;  [facility];  Service: Cardiovascular;  Laterality: N/A;  . PORTACATH PLACEMENT Right 12-31-14   Dr ByBary Horne  Social History   Socioeconomic History  . Marital status: Divorced    Spouse name: Not on file  . Number of children: Not on file  . Years of education: Not on file  . Highest education level: Not on file  Occupational History  . Not on file  Tobacco Use  .  Smoking status: Light Tobacco Smoker    Packs/day: 0.50    Years: 18.00    Pack years: 9.00    Types: Cigarettes  . Smokeless tobacco: Never Used  Substance and Sexual Activity  . Alcohol use: No    Alcohol/week: 0.0 standard drinks  . Drug use: No  . Sexual activity: Not on file  Other Topics Concern  . Not on file  Social History Narrative   Lives in [place] by herself.   Social Determinants of Health   Financial Resource Strain:   . Difficulty of Paying Living Expenses: Not on file  Food Insecurity:   . Worried About Charity fundraiser in the Last Year: Not on file  . Ran Out of Food in the Last Year: Not on file  Transportation Needs:   . Lack of Transportation (Medical): Not on file  . Lack of Transportation (Non-Medical): Not on  file  Physical Activity:   . Days of Exercise per Week: Not on file  . Minutes of Exercise per Session: Not on file  Stress:   . Feeling of Stress : Not on file  Social Connections:   . Frequency of Communication with Friends and Family: Not on file  . Frequency of Social Gatherings with Friends and Family: Not on file  . Attends Religious Services: Not on file  . Active Member of Clubs or Organizations: Not on file  . Attends Archivist Meetings: Not on file  . Marital Status: Not on file  Intimate Partner Violence:   . Fear of Current or Ex-Partner: Not on file  . Emotionally Abused: Not on file  . Physically Abused: Not on file  . Sexually Abused: Not on file    Family History  Problem Relation Age of Onset  . Breast cancer Maternal Aunt   . Breast cancer Cousin   . Brain cancer Maternal Uncle   . Diabetes Mother   . Hypertension Mother   . Stroke Mother      Current Outpatient Medications:  .  flecainide (TAMBOCOR) 50 MG tablet, Take 1 tablet (50 mg total) by mouth 2 (two) times daily., Disp: 60 tablet, Rfl: 4 .  fluconazole (DIFLUCAN) 200 MG tablet, Take 1 tablet (200 mg total) by mouth daily., Disp: 14 tablet, Rfl: 0 .  ipratropium (ATROVENT) 0.03 % nasal spray, Place 2 sprays into both nostrils every 12 (twelve) hours., Disp: 30 mL, Rfl: 12 .  levocetirizine (XYZAL) 5 MG tablet, Take 1 tablet (5 mg total) by mouth every evening., Disp: 30 tablet, Rfl: 2 .  magnesium oxide (MAG-OX) 400 MG tablet, Take 1 tablet (400 mg total) by mouth 2 (two) times daily., Disp: 60 tablet, Rfl: 5 .  meclizine (ANTIVERT) 25 MG tablet, Take 1 tablet (25 mg total) by mouth 3 (three) times daily as needed for dizziness., Disp: 30 tablet, Rfl: 0 .  metoprolol tartrate (LOPRESSOR) 25 MG tablet, Take 1 tablet (25 mg total) by mouth 2 (two) times daily., Disp: 60 tablet, Rfl: 1 .  montelukast (SINGULAIR) 10 MG tablet, Take 1 tablet (10 mg total) by mouth at bedtime., Disp: 60 tablet,  Rfl: 3 .  pregabalin (LYRICA) 100 MG capsule, Take 1 capsule (100 mg total) by mouth 2 (two) times daily., Disp: 60 capsule, Rfl: 2 .  Vitamin D, Ergocalciferol, (DRISDOL) 1.25 MG (50000 UNIT) CAPS capsule, TAKE 1 CAPSULE (50,000 UNITS TOTAL) ONCE A WEEK BY MOUTH., Disp: 12 capsule, Rfl: 1 No current facility-administered medications for this visit.  Facility-Administered Medications Ordered in Other Visits:  .  0.9 %  sodium chloride infusion, , Intravenous, Continuous, Sarah Alf, MD, New Bag at 12/10/16 1140 .  0.9 %  sodium chloride infusion, , Intravenous, Continuous, Lloyd Huger, MD, Last Rate: 999 mL/hr at 04/24/15 1520, New Bag at 05/23/15 0951 .  0.9 %  sodium chloride infusion, , Intravenous, Continuous, Verlon Au, NP, Last Rate: 999 mL/hr at 10/31/19 1522, New Bag at 10/31/19 1522 .  heparin lock flush 100 unit/mL, 500 Units, Intravenous, Once, Berenzon, Dmitriy, MD .  sodium chloride 0.9 % injection 10 mL, 10 mL, Intravenous, PRN, Ma Hillock, Sandeep, MD, 10 mL at 04/04/15 1440 .  sodium chloride flush (NS) 0.9 % injection 10 mL, 10 mL, Intravenous, PRN, Rudean Hitt, Dmitriy, MD  Physical exam: There were no vitals filed for this visit. Physical Exam Constitutional:      Appearance: She is well-developed.     Comments: Fatigued appearing; sleeping in recliner in exam room.  Wearing mask  HENT:     Head: Atraumatic.     Nose: Nose normal.     Mouth/Throat:     Pharynx: No oropharyngeal exudate.  Eyes:     General: No scleral icterus.    Conjunctiva/sclera: Conjunctivae normal.  Cardiovascular:     Rate and Rhythm: Normal rate and regular rhythm.  Pulmonary:     Effort: Pulmonary effort is normal. No respiratory distress.     Breath sounds: Normal breath sounds.  Abdominal:     General: Bowel sounds are normal. There is no distension.     Palpations: Abdomen is soft.     Tenderness: There is no abdominal tenderness.  Musculoskeletal:        General: Normal  range of motion.     Cervical back: Normal range of motion and neck supple.  Skin:    General: Skin is warm and dry.     Coloration: Skin is not pale.  Neurological:     Mental Status: She is oriented to person, place, and time.     Motor: No weakness.  Psychiatric:        Mood and Affect: Mood normal.        Behavior: Behavior normal.      CMP Latest Ref Rng & Units 10/31/2019  Glucose 70 - 99 mg/dL 119(H)  BUN 8 - 23 mg/dL 27(H)  Creatinine 0.44 - 1.00 mg/dL 1.23(H)  Sodium 135 - 145 mmol/L 133(L)  Potassium 3.5 - 5.1 mmol/L 4.6  Chloride 98 - 111 mmol/L 97(L)  CO2 22 - 32 mmol/L 22  Calcium 8.9 - 10.3 mg/dL 9.1  Total Protein 6.5 - 8.1 g/dL 7.5  Total Bilirubin 0.3 - 1.2 mg/dL 2.2(H)  Alkaline Phos 38 - 126 U/L 261(H)  AST 15 - 41 U/L 43(H)  ALT 0 - 44 U/L 61(H)   CBC Latest Ref Rng & Units 10/31/2019  WBC 4.0 - 10.5 K/uL 3.1(L)  Hemoglobin 12.0 - 15.0 g/dL 11.9(L)  Hematocrit 36.0 - 46.0 % 37.4  Platelets 150 - 400 K/uL 192    No images are attached to the encounter.  DG Chest 1 View  Result Date: 10/05/2019 CLINICAL DATA:  Assess if port is power injectable EXAM: CHEST  1 VIEW COMPARISON:  Radiograph 10/04/2019, CT Feb 03, 2019, radiographs 06/04/2017, 01/12/2017 FINDINGS: There is a right subclavian approach Port-A-Cath with the tip positioned at the superior cavoatrial junction. This port is currently accessed by [facility] needle. Notably, the typical labeling of the port  to denote whether or not this device is power injectable is not well visualized on this exam though was present on CT images from Feb 03, 2019 and more remote radiographic comparison from 01/12/2017. Overall configuration of the device itself including positioning of the port pocket, catheter curvature and tip position appear grossly unchanged. No consolidation, features of edema, pneumothorax, or effusion. The cardiomediastinal contours are unremarkable. No acute osseous or soft tissue abnormality. IMPRESSION:  1. Port-A-Cath with the tip positioned at the superior cavoatrial junction. This port port is currently accessed by [facility] needle. 2. Port labeling to denote whether not this devices power injectable is not well visualized on radiography but was present on comparison CT 02/03/2019 and radiograph 01/12/2017. Device appears grossly unchanged though should correlate in the patient's chart if there has been intervention upon the port since May of 2020. 3. No other acute cardiopulmonary findings. These results were called by telephone at the time of interpretation on 10/05/2019 at 6:15 am to provider [facility] , who verbally acknowledged these results. Electronically Signed   By: Lovena Le M.D.   On: 10/05/2019 06:15   CT Head Wo Contrast  Result Date: 10/05/2019 CLINICAL DATA:  Fall EXAM: CT HEAD WITHOUT CONTRAST CT CERVICAL SPINE WITHOUT CONTRAST TECHNIQUE: Multidetector CT imaging of the head and cervical spine was performed following the standard protocol without intravenous contrast. Multiplanar CT image reconstructions of the cervical spine were also generated. COMPARISON:  None. FINDINGS: CT HEAD FINDINGS Brain: There is no mass, hemorrhage or extra-axial collection. The size and configuration of the ventricles and extra-axial CSF spaces are normal. There is diffuse severe white matter hypoattenuation, possibly a sequela of radiation therapy. Vascular: No abnormal hyperdensity of the major intracranial arteries or dural venous sinuses. No intracranial atherosclerosis. Skull: The visualized skull base, calvarium and extracranial soft tissues are normal. Sinuses/Orbits: No fluid levels or advanced mucosal thickening of the visualized paranasal sinuses. No mastoid or middle ear effusion. The orbits are normal. CT CERVICAL SPINE FINDINGS Alignment: No static subluxation. Facets are aligned. Occipital condyles are normally positioned. Skull base and vertebrae: No acute fracture. Soft tissues and spinal canal: No  prevertebral fluid or swelling. No visible canal hematoma. Disc levels: Multilevel facet arthrosis. There is fusion of the right C2 and C3 facets. Upper chest: Large cavitary lesion of the left lung apex, measuring up to 3.5 cm. Other: Normal visualized paraspinal cervical soft tissues. IMPRESSION: 1. Severe chronic white matter disease, likely post radiation changes. No acute intracranial abnormality. 2. No acute fracture or static subluxation of the cervical spine. 3. Large cavitary lesion of the left lung apex, measuring up to 3.5 cm, likely a metastasis. Electronically Signed   By: Ulyses Jarred M.D.   On: 10/05/2019 01:27   CT ANGIO CHEST PE W OR WO CONTRAST  Result Date: 10/05/2019 CLINICAL DATA:  Breast carcinoma.  Tachycardia and pain EXAM: CT ANGIOGRAPHY CHEST WITH CONTRAST TECHNIQUE: Multidetector CT imaging of the chest was performed using the standard protocol during bolus administration of intravenous contrast. Multiplanar CT image reconstructions and MIPs were obtained to evaluate the vascular anatomy. CONTRAST:  49m OMNIPAQUE IOHEXOL 350 MG/ML SOLN COMPARISON:  Chest radiograph October 05, 2019; PET-CT June 20, 2019 FINDINGS: Cardiovascular: There is no demonstrable pulmonary embolus. There is no appreciable thoracic aortic aneurysm or dissection. The visualized great vessels appear unremarkable. Right innominate and left common carotid arteries arise as a common trunk, an anatomic variant. There is no pericardial effusion or pericardial thickening. Port-A-Cath tip is  in the superior vena cava. Mediastinum/Nodes: Thyroid appears unremarkable. There are several subcentimeter lymph nodes. There is no frank adenopathy by size criteria. There is a small hiatal hernia. Lungs/Pleura: There is a cavitary nodular lesion in the left apex measuring 4.5 x 3.8 cm. There is mild pneumonitis adjacent to this apparent mass. There is mild bibasilar atelectasis. No pleural effusions evident. Upper Abdomen: The  liver contour is nodular, raising concern for a degree of underlying hepatic cirrhosis. There is an area of decreased attenuation in the left lobe of the liver measuring 4.7 x 2.5 cm, concerning for potential metastasis. There are ill-defined lesions throughout the right lobe of the liver is well consistent with suspected multifocal metastatic disease. Visualized upper abdominal structures otherwise appear unremarkable. Musculoskeletal: No blastic or lytic bone lesions are demonstrable. Port is noted anteriorly on the right. Postoperative change noted in left breast. Review of the MIP images confirms the above findings. IMPRESSION: 1. No demonstrable pulmonary embolus. No thoracic aortic aneurysm or dissection. 2. Cavitary mass in the left apex region with mild surrounding pneumonitis. This lesion measures 4.5 x 3.8 cm. Neoplastic focus felt to be likely. 3. Liver has a somewhat nodular contour, likely due to underlying hepatic cirrhosis. Multiple mass lesions are noted throughout the liver, likely metastatic disease. 4.  No adenopathy by size criteria. 5.  Postoperative change left breast. 6.  Small hiatal hernia. Electronically Signed   By: Lowella Grip III M.D.   On: 10/05/2019 07:59   CT Cervical Spine Wo Contrast  Result Date: 10/05/2019 CLINICAL DATA:  Fall EXAM: CT HEAD WITHOUT CONTRAST CT CERVICAL SPINE WITHOUT CONTRAST TECHNIQUE: Multidetector CT imaging of the head and cervical spine was performed following the standard protocol without intravenous contrast. Multiplanar CT image reconstructions of the cervical spine were also generated. COMPARISON:  None. FINDINGS: CT HEAD FINDINGS Brain: There is no mass, hemorrhage or extra-axial collection. The size and configuration of the ventricles and extra-axial CSF spaces are normal. There is diffuse severe white matter hypoattenuation, possibly a sequela of radiation therapy. Vascular: No abnormal hyperdensity of the major intracranial arteries or dural  venous sinuses. No intracranial atherosclerosis. Skull: The visualized skull base, calvarium and extracranial soft tissues are normal. Sinuses/Orbits: No fluid levels or advanced mucosal thickening of the visualized paranasal sinuses. No mastoid or middle ear effusion. The orbits are normal. CT CERVICAL SPINE FINDINGS Alignment: No static subluxation. Facets are aligned. Occipital condyles are normally positioned. Skull base and vertebrae: No acute fracture. Soft tissues and spinal canal: No prevertebral fluid or swelling. No visible canal hematoma. Disc levels: Multilevel facet arthrosis. There is fusion of the right C2 and C3 facets. Upper chest: Large cavitary lesion of the left lung apex, measuring up to 3.5 cm. Other: Normal visualized paraspinal cervical soft tissues. IMPRESSION: 1. Severe chronic white matter disease, likely post radiation changes. No acute intracranial abnormality. 2. No acute fracture or static subluxation of the cervical spine. 3. Large cavitary lesion of the left lung apex, measuring up to 3.5 cm, likely a metastasis. Electronically Signed   By: Ulyses Jarred M.D.   On: 10/05/2019 01:27   DG Chest Port 1 View  Result Date: 10/04/2019 CLINICAL DATA:  Fevers. EXAM: PORTABLE CHEST 1 VIEW COMPARISON:  06/04/2017, 06/20/2019 PET-CT FINDINGS: Cardiac shadows within normal limits. Right chest wall port is again seen and stable. Changes of prior left mastectomy are seen. No acute bony abnormality is seen. No focal infiltrate is noted. IMPRESSION: No acute abnormality noted. Electronically Signed  By: Inez Catalina M.D.   On: 10/04/2019 23:41   DG ESOPHAGUS W DOUBLE CM (HD)  Result Date: 10/27/2019 CLINICAL DATA:  Dysphagia. Difficulty swallowing pills and solid food. EXAM: ESOPHOGRAM/BARIUM SWALLOW TECHNIQUE: Combined double contrast and single contrast examination performed using effervescent crystals, thick barium liquid, and thin barium liquid. FLUOROSCOPY TIME:  Fluoroscopy Time:  1  minutes 18 seconds. Radiation Exposure Index (if provided by the fluoroscopic device): 27.2 mGy COMPARISON:  CT chest 10/05/2019. FINDINGS: Standard double and single contrast barium swallow performed. A prominent cervical esophageal web is noted. No evidence of aspiration. Thoracic esophagus is widely patent. Peristalsis normal. No tablet given due to the upper esophageal web. IMPRESSION: 1.  Prominent cervical esophageal web.  No evidence of aspiration. 2. Thoracic esophagus is normal. No evidence of hiatal hernia or reflux. Electronically Signed   By: Marcello Moores  Register   On: 10/27/2019 11:54    Assessment and plan- Patient is a 64 y.o. female diagnosed with recurrent metastatic breast cancer currently receiving weekly Adriamycin s/p RT for brain metastases who [facility] for fatigue, poor oral intake, and sore throat.   1.  Convalescence following chemotherapy/fatigue secondary to chemotherapy-symptoms likely secondary to chemotherapy and chronic disease.  IV fluids in clinic today.   2.  Poor oral intake-fatigue and sore throat likely contributing to anorexia.  Encouraged supplemental calories with Ensure and/or boost type products.  Encourage small frequent meals, sips/tastes/etc. can premedicate with antiemetics if needed.  May consider appetite stimulant if weight drops.   3.  Sore throat-previously had thrush and dysphagia.  Awaiting GI eval and esophagogram.  Currently on Diflucan. Tolerating orals in clinic and able to swallow pills. Immunocompromise, steroids, and dehydration likely contributing. Encouraged small frequent sips.   4.  Leukopenia-secondary to chemotherapy.  WBC 3.1, ANC 2.4 (improved).  Afebrile.  Continue to monitor.  5. Elevated serum creatinine - lkekly sec to poor oral intake/dehydration. Cr 1.23, BUN 27. Na 133. IV fluids in clinic today.   6. Elevated LFTs- abnormal LFTs secondary to disease. Overall improved however. Bilirubin slightly more  elevated at 2.2. Continue to monitor.   Disposition:  Ok to discharge home. Follow up with Dr. Rogue Bussing as scheduled or return to clinic sooner if symptoms do not improve or worsen.    Visit Diagnosis 1. Chemotherapy-induced fatigue   2. Convalescence following chemotherapy   3. Sore throat   4. Leukopenia due to antineoplastic chemotherapy (HCC)   5. Elevated serum creatinine   6. Abnormal LFTs     Patient expressed understanding and was in agreement with this plan. She also understands that She can call clinic at any time with any questions, concerns, or complaints.   Thank you for allowing me to participate in the care of this very pleasant patient.   Beckey Rutter, DNP, AGNP-C [facility] [place]

## 2019-10-31 NOTE — Telephone Encounter (Signed)
Patient called stating she thinks she is dehydrated as she is not eating or drinking due to pain when she swallows. She states she is drinking maybe 16 oz per day. Please advise if she can come in for IV fluids and if she needs labs and [facility] visit

## 2019-10-31 NOTE — Telephone Encounter (Signed)
I think its reasonable for pt to be seen in [facility]-for possible IVFs/labs-cbc/cmp;hold tube.  GB

## 2019-10-31 NOTE — Telephone Encounter (Signed)
Patient accepts appointment for 230 today

## 2019-11-01 ENCOUNTER — Other Ambulatory Visit: Payer: Self-pay

## 2019-11-01 ENCOUNTER — Inpatient Hospital Stay
Admission: EM | Admit: 2019-11-01 | Discharge: 2019-11-07 | DRG: 299 | Disposition: A | Discharge status: Home Health | Payer: Medicare HMO | Attending: Internal Medicine | Admitting: Internal Medicine

## 2019-11-01 ENCOUNTER — Emergency Department: Payer: Medicare HMO

## 2019-11-01 DIAGNOSIS — K922 Gastrointestinal hemorrhage, unspecified: Secondary | ICD-10-CM | POA: Diagnosis not present

## 2019-11-01 DIAGNOSIS — Z95828 Presence of other vascular implants and grafts: Secondary | ICD-10-CM | POA: Diagnosis not present

## 2019-11-01 DIAGNOSIS — K2210 Ulcer of esophagus without bleeding: Secondary | ICD-10-CM | POA: Diagnosis not present

## 2019-11-01 DIAGNOSIS — K2970 Gastritis, unspecified, without bleeding: Secondary | ICD-10-CM | POA: Diagnosis not present

## 2019-11-01 DIAGNOSIS — M2550 Pain in unspecified joint: Secondary | ICD-10-CM | POA: Diagnosis not present

## 2019-11-01 DIAGNOSIS — I471 Supraventricular tachycardia: Secondary | ICD-10-CM | POA: Diagnosis present

## 2019-11-01 DIAGNOSIS — I2510 Atherosclerotic heart disease of native coronary artery without angina pectoris: Secondary | ICD-10-CM | POA: Diagnosis present

## 2019-11-01 DIAGNOSIS — I10 Essential (primary) hypertension: Secondary | ICD-10-CM | POA: Diagnosis present

## 2019-11-01 DIAGNOSIS — Q394 Esophageal web: Secondary | ICD-10-CM

## 2019-11-01 DIAGNOSIS — D61818 Other pancytopenia: Secondary | ICD-10-CM | POA: Diagnosis present

## 2019-11-01 DIAGNOSIS — I82433 Acute embolism and thrombosis of popliteal vein, bilateral: Secondary | ICD-10-CM | POA: Diagnosis present

## 2019-11-01 DIAGNOSIS — Z20822 Contact with and (suspected) exposure to covid-19: Secondary | ICD-10-CM | POA: Diagnosis not present

## 2019-11-01 DIAGNOSIS — D61810 Antineoplastic chemotherapy induced pancytopenia: Secondary | ICD-10-CM | POA: Diagnosis present

## 2019-11-01 DIAGNOSIS — K222 Esophageal obstruction: Secondary | ICD-10-CM | POA: Diagnosis not present

## 2019-11-01 DIAGNOSIS — I429 Cardiomyopathy, unspecified: Secondary | ICD-10-CM | POA: Diagnosis present

## 2019-11-01 DIAGNOSIS — Z515 Encounter for palliative care: Secondary | ICD-10-CM | POA: Diagnosis present

## 2019-11-01 DIAGNOSIS — I82512 Chronic embolism and thrombosis of left femoral vein: Secondary | ICD-10-CM | POA: Diagnosis present

## 2019-11-01 DIAGNOSIS — R1310 Dysphagia, unspecified: Secondary | ICD-10-CM | POA: Diagnosis not present

## 2019-11-01 DIAGNOSIS — M6281 Muscle weakness (generalized): Secondary | ICD-10-CM | POA: Diagnosis not present

## 2019-11-01 DIAGNOSIS — Z8711 Personal history of peptic ulcer disease: Secondary | ICD-10-CM

## 2019-11-01 DIAGNOSIS — Z803 Family history of malignant neoplasm of breast: Secondary | ICD-10-CM

## 2019-11-01 DIAGNOSIS — I82401 Acute embolism and thrombosis of unspecified deep veins of right lower extremity: Secondary | ICD-10-CM | POA: Diagnosis not present

## 2019-11-01 DIAGNOSIS — E1165 Type 2 diabetes mellitus with hyperglycemia: Secondary | ICD-10-CM | POA: Diagnosis not present

## 2019-11-01 DIAGNOSIS — E871 Hypo-osmolality and hyponatremia: Secondary | ICD-10-CM | POA: Diagnosis present

## 2019-11-01 DIAGNOSIS — B379 Candidiasis, unspecified: Secondary | ICD-10-CM | POA: Diagnosis present

## 2019-11-01 DIAGNOSIS — Z7401 Bed confinement status: Secondary | ICD-10-CM | POA: Diagnosis not present

## 2019-11-01 DIAGNOSIS — I82409 Acute embolism and thrombosis of unspecified deep veins of unspecified lower extremity: Secondary | ICD-10-CM | POA: Diagnosis not present

## 2019-11-01 DIAGNOSIS — C7931 Secondary malignant neoplasm of brain: Secondary | ICD-10-CM | POA: Diagnosis not present

## 2019-11-01 DIAGNOSIS — I82432 Acute embolism and thrombosis of left popliteal vein: Secondary | ICD-10-CM | POA: Diagnosis present

## 2019-11-01 DIAGNOSIS — Z170 Estrogen receptor positive status [ER+]: Secondary | ICD-10-CM

## 2019-11-01 DIAGNOSIS — C50412 Malignant neoplasm of upper-outer quadrant of left female breast: Secondary | ICD-10-CM | POA: Diagnosis not present

## 2019-11-01 DIAGNOSIS — E569 Vitamin deficiency, unspecified: Secondary | ICD-10-CM | POA: Diagnosis not present

## 2019-11-01 DIAGNOSIS — E860 Dehydration: Secondary | ICD-10-CM | POA: Diagnosis present

## 2019-11-01 DIAGNOSIS — Z66 Do not resuscitate: Secondary | ICD-10-CM | POA: Diagnosis not present

## 2019-11-01 DIAGNOSIS — R531 Weakness: Secondary | ICD-10-CM

## 2019-11-01 DIAGNOSIS — G620 Drug-induced polyneuropathy: Secondary | ICD-10-CM | POA: Diagnosis present

## 2019-11-01 DIAGNOSIS — T451X5A Adverse effect of antineoplastic and immunosuppressive drugs, initial encounter: Secondary | ICD-10-CM | POA: Diagnosis not present

## 2019-11-01 DIAGNOSIS — F17210 Nicotine dependence, cigarettes, uncomplicated: Secondary | ICD-10-CM | POA: Diagnosis present

## 2019-11-01 DIAGNOSIS — I82403 Acute embolism and thrombosis of unspecified deep veins of lower extremity, bilateral: Secondary | ICD-10-CM | POA: Diagnosis not present

## 2019-11-01 DIAGNOSIS — Z86711 Personal history of pulmonary embolism: Secondary | ICD-10-CM

## 2019-11-01 DIAGNOSIS — Z79899 Other long term (current) drug therapy: Secondary | ICD-10-CM

## 2019-11-01 DIAGNOSIS — I82411 Acute embolism and thrombosis of right femoral vein: Secondary | ICD-10-CM | POA: Diagnosis not present

## 2019-11-01 DIAGNOSIS — E785 Hyperlipidemia, unspecified: Secondary | ICD-10-CM | POA: Diagnosis present

## 2019-11-01 DIAGNOSIS — Z8719 Personal history of other diseases of the digestive system: Secondary | ICD-10-CM

## 2019-11-01 DIAGNOSIS — C7951 Secondary malignant neoplasm of bone: Secondary | ICD-10-CM | POA: Diagnosis present

## 2019-11-01 DIAGNOSIS — R0902 Hypoxemia: Secondary | ICD-10-CM | POA: Diagnosis not present

## 2019-11-01 DIAGNOSIS — B370 Candidal stomatitis: Secondary | ICD-10-CM | POA: Diagnosis present

## 2019-11-01 DIAGNOSIS — Z808 Family history of malignant neoplasm of other organs or systems: Secondary | ICD-10-CM

## 2019-11-01 DIAGNOSIS — R000 Tachycardia, unspecified: Secondary | ICD-10-CM | POA: Diagnosis not present

## 2019-11-01 DIAGNOSIS — C50919 Malignant neoplasm of unspecified site of unspecified female breast: Secondary | ICD-10-CM | POA: Diagnosis not present

## 2019-11-01 DIAGNOSIS — W19XXXA Unspecified fall, initial encounter: Secondary | ICD-10-CM | POA: Diagnosis not present

## 2019-11-01 DIAGNOSIS — C787 Secondary malignant neoplasm of liver and intrahepatic bile duct: Secondary | ICD-10-CM | POA: Diagnosis present

## 2019-11-01 DIAGNOSIS — I252 Old myocardial infarction: Secondary | ICD-10-CM

## 2019-11-01 LAB — COMPREHENSIVE METABOLIC PANEL
ALT: 53 U/L — ABNORMAL HIGH (ref 0–44)
AST: 43 U/L — ABNORMAL HIGH (ref 15–41)
Albumin: 3.9 g/dL (ref 3.5–5.0)
Alkaline Phosphatase: 230 U/L — ABNORMAL HIGH (ref 38–126)
Anion gap: 10 (ref 5–15)
BUN: 31 mg/dL — ABNORMAL HIGH (ref 8–23)
CO2: 25 mmol/L (ref 22–32)
Calcium: 9.1 mg/dL (ref 8.9–10.3)
Chloride: 97 mmol/L — ABNORMAL LOW (ref 98–111)
Creatinine, Ser: 1.27 mg/dL — ABNORMAL HIGH (ref 0.44–1.00)
GFR calc Af Amer: 51 mL/min — ABNORMAL LOW (ref >60)
GFR calc non Af Amer: 44 mL/min — ABNORMAL LOW (ref >60)
Glucose, Bld: 118 mg/dL — ABNORMAL HIGH (ref 70–99)
Potassium: 4.8 mmol/L (ref 3.5–5.1)
Sodium: 132 mmol/L — ABNORMAL LOW (ref 135–145)
Total Bilirubin: 2.2 mg/dL — ABNORMAL HIGH (ref 0.3–1.2)
Total Protein: 7 g/dL (ref 6.5–8.1)

## 2019-11-01 LAB — CBC WITH DIFFERENTIAL/PLATELET
Abs Immature Granulocytes: 0.03 10*3/uL (ref 0.00–0.07)
Basophils Absolute: 0 10*3/uL (ref 0.0–0.1)
Basophils Relative: 0 %
Eosinophils Absolute: 0 10*3/uL (ref 0.0–0.5)
Eosinophils Relative: 0 %
HCT: 32.2 % — ABNORMAL LOW (ref 36.0–46.0)
Hemoglobin: 10.5 g/dL — ABNORMAL LOW (ref 12.0–15.0)
Immature Granulocytes: 1 %
Lymphocytes Relative: 14 %
Lymphs Abs: 0.4 10*3/uL — ABNORMAL LOW (ref 0.7–4.0)
MCH: 31.9 pg (ref 26.0–34.0)
MCHC: 32.6 g/dL (ref 30.0–36.0)
MCV: 97.9 fL (ref 80.0–100.0)
Monocytes Absolute: 0.1 10*3/uL (ref 0.1–1.0)
Monocytes Relative: 2 %
Neutro Abs: 2.4 10*3/uL (ref 1.7–7.7)
Neutrophils Relative %: 83 %
Platelets: 131 10*3/uL — ABNORMAL LOW (ref 150–400)
RBC: 3.29 MIL/uL — ABNORMAL LOW (ref 3.87–5.11)
RDW: 18.5 % — ABNORMAL HIGH (ref 11.5–15.5)
WBC: 2.8 10*3/uL — ABNORMAL LOW (ref 4.0–10.5)
nRBC: 2.8 % — ABNORMAL HIGH (ref 0.0–0.2)

## 2019-11-01 LAB — PROTIME-INR
INR: 1.2 (ref 0.8–1.2)
Prothrombin Time: 14.9 seconds (ref 11.4–15.2)

## 2019-11-01 LAB — LIPASE, BLOOD: Lipase: 29 U/L (ref 11–51)

## 2019-11-01 LAB — APTT: aPTT: 52 seconds — ABNORMAL HIGH (ref 24–36)

## 2019-11-01 LAB — HEPARIN LEVEL (UNFRACTIONATED): Heparin Unfractionated: 1.61 IU/mL — ABNORMAL HIGH (ref 0.30–0.70)

## 2019-11-01 LAB — RESPIRATORY PANEL BY RT PCR (FLU A&B, COVID)
Influenza A by PCR: NEGATIVE
Influenza B by PCR: NEGATIVE
SARS Coronavirus 2 by RT PCR: NEGATIVE

## 2019-11-01 LAB — MAGNESIUM: Magnesium: 2.7 mg/dL — ABNORMAL HIGH (ref 1.7–2.4)

## 2019-11-01 MED ORDER — ACETAMINOPHEN 325 MG PO TABS: 650.0000 mg | ORAL_TABLET | Freq: Four times a day (QID) | ORAL | Status: DC | PRN

## 2019-11-01 MED ORDER — PREGABALIN 50 MG PO CAPS
100.0000 mg | ORAL_CAPSULE | Freq: Two times a day (BID) | ORAL | Status: DC
  Administered 2019-11-01 – 2019-11-07 (×11): 100 mg via ORAL
  Filled 2019-11-01 (×11): qty 2

## 2019-11-01 MED ORDER — MAGNESIUM OXIDE 400 (241.3 MG) MG PO TABS
400.0000 mg | ORAL_TABLET | Freq: Two times a day (BID) | ORAL | Status: DC
  Administered 2019-11-01 – 2019-11-07 (×11): 400 mg via ORAL
  Filled 2019-11-01 (×11): qty 1

## 2019-11-01 MED ORDER — FENTANYL CITRATE (PF) 100 MCG/2ML IJ SOLN
100.0000 ug | Freq: Once | INTRAMUSCULAR | Status: AC
  Administered 2019-11-01: 16:00:00 100 ug via INTRAVENOUS
  Filled 2019-11-01: qty 2

## 2019-11-01 MED ORDER — HEPARIN (PORCINE) 25000 UT/250ML-% IV SOLN
1100.0000 [IU]/h | INTRAVENOUS | Status: DC
  Administered 2019-11-01: 16:00:00 1100 [IU]/h via INTRAVENOUS
  Filled 2019-11-01: qty 250

## 2019-11-01 MED ORDER — METOPROLOL TARTRATE 25 MG PO TABS
25.0000 mg | ORAL_TABLET | Freq: Two times a day (BID) | ORAL | Status: DC
  Administered 2019-11-01 – 2019-11-07 (×11): 25 mg via ORAL
  Filled 2019-11-01 (×11): qty 1

## 2019-11-01 MED ORDER — LEVOCETIRIZINE DIHYDROCHLORIDE 5 MG PO TABS: 5.0000 mg | ORAL_TABLET | Freq: Every evening | ORAL | Status: DC

## 2019-11-01 MED ORDER — MECLIZINE HCL 25 MG PO TABS
25.0000 mg | ORAL_TABLET | Freq: Three times a day (TID) | ORAL | Status: DC | PRN
  Filled 2019-11-01: qty 1

## 2019-11-01 MED ORDER — IPRATROPIUM BROMIDE 0.03 % NA SOLN
2.0000 | Freq: Two times a day (BID) | NASAL | Status: DC
  Administered 2019-11-01 – 2019-11-07 (×12): 2 via NASAL
  Filled 2019-11-01: qty 30

## 2019-11-01 MED ORDER — FLUCONAZOLE 100 MG PO TABS
200.0000 mg | ORAL_TABLET | Freq: Every day | ORAL | Status: DC
  Administered 2019-11-02 – 2019-11-07 (×5): 200 mg via ORAL
  Filled 2019-11-01 (×6): qty 2

## 2019-11-01 MED ORDER — FLECAINIDE ACETATE 50 MG PO TABS
50.0000 mg | ORAL_TABLET | Freq: Two times a day (BID) | ORAL | Status: DC
  Administered 2019-11-01 – 2019-11-07 (×11): 50 mg via ORAL
  Filled 2019-11-01 (×13): qty 1

## 2019-11-01 MED ORDER — LACTATED RINGERS IV BOLUS
1000.0000 mL | Freq: Once | INTRAVENOUS | Status: AC
  Administered 2019-11-01: 15:00:00 1000 mL via INTRAVENOUS

## 2019-11-01 MED ORDER — MONTELUKAST SODIUM 10 MG PO TABS
10.0000 mg | ORAL_TABLET | Freq: Every day | ORAL | Status: DC
  Administered 2019-11-01 – 2019-11-06 (×6): 10 mg via ORAL
  Filled 2019-11-01 (×6): qty 1

## 2019-11-01 MED ORDER — HEPARIN BOLUS VIA INFUSION
4000.0000 [IU] | Freq: Once | INTRAVENOUS | Status: AC
  Administered 2019-11-01: 4000 [IU] via INTRAVENOUS
  Filled 2019-11-01: qty 4000

## 2019-11-01 MED ORDER — ONDANSETRON HCL 4 MG/2ML IJ SOLN: 4.0000 mg | Freq: Four times a day (QID) | INTRAMUSCULAR | Status: DC | PRN

## 2019-11-01 MED ORDER — HEPARIN (PORCINE) 25000 UT/250ML-% IV SOLN
750.0000 [IU]/h | INTRAVENOUS | Status: AC
  Administered 2019-11-02: 900 [IU]/h via INTRAVENOUS
  Administered 2019-11-02: 750 [IU]/h via INTRAVENOUS
  Filled 2019-11-01: qty 250

## 2019-11-01 MED ORDER — CETIRIZINE HCL 10 MG PO TABS
10.0000 mg | ORAL_TABLET | Freq: Every day | ORAL | Status: DC
  Administered 2019-11-01 – 2019-11-07 (×6): 10 mg via ORAL
  Filled 2019-11-01 (×8): qty 1

## 2019-11-01 NOTE — Progress Notes (Signed)
[place] for heparin Indication: DVT  Allergies  Allergen Reactions  . No Known Allergies     Patient Measurements: Height: 5' 6"  (167.6 cm) Weight: 160 lb (72.6 kg) IBW/kg (Calculated) : 59.3 Heparin Dosing Weight: 72.6 kg  Vital Signs: Temp: 97.5 F (36.4 C) (02/03 1247) Temp Source: Oral (02/03 1247) BP: 137/86 (02/03 1600) Pulse Rate: 118 (02/03 1247)  Labs: Recent Labs    10/31/19 1453 11/01/19 1425  HGB 11.9* 10.5*  HCT 37.4 32.2*  PLT 192 131*  CREATININE 1.23* 1.27*    Estimated Creatinine Clearance: 45 mL/min (A) (by C-G formula based on SCr of 1.27 mg/dL (H)).   Medical History: Past Medical History:  Diagnosis Date  . Chemotherapy-induced peripheral neuropathy ([place])   . Epistaxis    a. 11/2016 in setting of asa/plavix-->silver nitrate cauterization.  . GI bleed    a. 11/2016 Admission w/ GIB and hypovolemic shock req 3u PRBC's;  b. 11/2016 ECG: gastritis & nonbleeding peptic ulcer; c. 11/2016 Conlonoscopy: rectal and sigmoid colonic ulcers.  . Heart attack ([place])    a. 1998 Cath @ [facility]: reportedly no intervention required.  Marland Kitchen Herceptin-induced cardiomyopathy ([place])    a. In the setting of Herceptin Rx for breast cancer (initiated 12/2014); b. 03/2015 MUGA EF 64%; b. 08/2015 MUGA: EF 51%; c. 10/2015 MUGA: EF 44%; d. 11/2015 Echo: EF 45-50%; e. 01/2016 MUGA: EF 60%; f. 06/2016 MUGA EF 65%; g. 10/2016 MUGA: EF 61%;  h. 12/2016 Echo: EF 55-60%, gr1 DD.  Marland Kitchen Hyperlipidemia   . Hypertension   . Neuropathy   . Possible PAD (peripheral artery disease) ([place])    a. 11/2016 LE cyanosis and weak pulses-->CTA w/o significant Ao-BiFem dzs. ? distal dzs-->ASA/Plavix initiated by vascular surgery.  Marland Kitchen PSVT (paroxysmal supraventricular tachycardia) ([place])    a. Dx 11/2016.  . Pulmonary embolism ([place])    a. 12/2016 CTA Chest: small nonocclusive PE in inferior segment of the Left lingula, somewhat eccentric filling defect suggesting chronic rather  than acute embolic event; b. 03/9037 LE U/S:  No DVT; c. 12/2016 Echo: Nl RV fxn, nl PASP.  Marland Kitchen Recurrent Metastatic breast cancer ([place])    a. Dx 2016: Stage II, ER positive, PR positive, HER-2/neu overexpressing of the left breast-->chemo/radiation; b. 10/2016 CT Abd/pelvis: diffuse liver mets, ill defined sclerotic bone lesions-T12;  c. 10/2016 MRI brain: metastatic lesion along L temporal lobe ([id number]) w/ extensive surrounding edema & [id number] shift to right.  . [place]is [place]  . Sinus tachycardia     Assessment: [age]ear old female presented with increased weakness and fall. Patient receiving chemotherapy for breast cancer with mets to liver and brain. Ultrasound lower extremities positive for extensive occlusive and likely acute DVT throughout right lower extremity as well as what appears to be a combination of acute and chronic DVT in the left lower extremity. Pharmacy consulted for heparin drip for VTE treatment.  Goal of Therapy:  Heparin level 0.3-0.7 units/ml Monitor platelets by anticoagulation protocol: Yes   Plan:  Heparin 4000 unit bolus followed by heparin drip at 1100 units/hr. HL at 2200. CBC daily while on heparin drip.  AbTawnya CrookPharmD 11/01/2019,4:16 PM

## 2019-11-01 NOTE — ED Notes (Signed)
Pt transported to ultrasound before this RN was able to access port and obtain blood work.

## 2019-11-01 NOTE — H&P (Addendum)
History and Physical:    Sarah Horne   ZOX:[id number] DOB: 02-01-1956 DOA: 11/01/2019  Referring MD/provider: Dr. Kerman Passey PCP: Delsa Grana, PA-C   Patient coming from: Home  Chief Complaint: Generalized weakness, right leg pain  History of Present Illness:   Sarah Horne is an 64 y.o. female with medical history significant for pulmonary embolism in 2018, Herceptin-induced cardiomyopathy, PSVT, sinus tachycardia, chemotherapy-induced peripheral neuropathy, epistaxis, CAD (heart attack in 1998), stage IV left breast cancer with metastasis to the liver, brain, T12 vertebra, hypertension, hyperlipidemia.  She presented to the hospital because of generalized weakness and right leg pain.  She said that the pain in the right leg started yesterday.  Pain is graded as severe (8/10).  She said it felt like something was pressing against her leg.  She has not been able to walk since yesterday because of severe right leg pain.  There are no known relieving or aggravating factors and pain is nonradiating.  She said she she has had swelling of the right lower extremity for almost 2 weeks now.  Swelling has progressively worsened.  She has been feeling weak and tired.  She said she slid out of bed this morning and once unable to get up.  She called EMS who brought her to the hospital for further evaluation.  She said she had radiation therapy several weeks ago and she had chemotherapy just last Friday (October 27, 2019).   ED Course:  The patient was afebrile but tachycardic.  Respiratory rate and blood pressure were normal.  She was found to be dehydrated so she was given 1 L of Ringer's lactate in the ED.  She was also given IV fentanyl for pain.  Venous duplex showed extensive DVT in the right lower extremity.  ROS:   ROS all other systems reviewed were negative  Past Medical History:   Past Medical History:  Diagnosis Date  . Chemotherapy-induced peripheral neuropathy ([place])   .  Epistaxis    a. 11/2016 in setting of asa/plavix-->silver nitrate cauterization.  . GI bleed    a. 11/2016 Admission w/ GIB and hypovolemic shock req 3u PRBC's;  b. 11/2016 ECG: gastritis & nonbleeding peptic ulcer; c. 11/2016 Conlonoscopy: rectal and sigmoid colonic ulcers.  . Heart attack ([place])    a. 1998 Cath @ [facility]: reportedly no intervention required.  Marland Kitchen Herceptin-induced cardiomyopathy ([place])    a. In the setting of Herceptin Rx for breast cancer (initiated 12/2014); b. 03/2015 MUGA EF 64%; b. 08/2015 MUGA: EF 51%; c. 10/2015 MUGA: EF 44%; d. 11/2015 Echo: EF 45-50%; e. 01/2016 MUGA: EF 60%; f. 06/2016 MUGA EF 65%; g. 10/2016 MUGA: EF 61%;  h. 12/2016 Echo: EF 55-60%, gr1 DD.  Marland Kitchen Hyperlipidemia   . Hypertension   . Neuropathy   . Possible PAD (peripheral artery disease) ([place])    a. 11/2016 LE cyanosis and weak pulses-->CTA w/o significant Ao-BiFem dzs. ? distal dzs-->ASA/Plavix initiated by vascular surgery.  Marland Kitchen PSVT (paroxysmal supraventricular tachycardia) ([place])    a. Dx 11/2016.  . Pulmonary embolism ([place])    a. 12/2016 CTA Chest: small nonocclusive PE in inferior segment of the Left lingula, somewhat eccentric filling defect suggesting chronic rather than acute embolic event; b. 04/1190 LE U/S:  No DVT; c. 12/2016 Echo: Nl RV fxn, nl PASP.  Marland Kitchen Recurrent Metastatic breast cancer ([place])    a. Dx 2016: Stage II, ER positive, PR positive, HER-2/neu overexpressing of the left breast-->chemo/radiation; b. 10/2016 CT Abd/pelvis: diffuse liver mets, ill  defined sclerotic bone lesions-T12;  c. 10/2016 MRI brain: metastatic lesion along L temporal lobe (19x61m) w/ extensive surrounding edema & 521mmidline shift to right.  . Sepsis [place]  . Sinus tachycardia     Past Surgical History:   Past Surgical History:  Procedure Laterality Date  . BREAST BIOPSY Left 2016   Positive  . BREAST LUMPECTOMY WITH SENTINEL LYMPH NODE BIOPSY Left 05/23/2015   Procedure: LEFT BREAST WIDE EXCISION WITH AXILLARY DISSECTION,  MASTOPLASTY ;  Surgeon: JeRobert BellowMD;  Location: [facility] ORS;  Service: General;  Laterality: Left;  . BREAST SURGERY Left 12/18/14   breast biopsy/INVASIVE DUCTAL CARCINOMA OF BREAST, NOTTINGHAM GRADE 2.  . Marland KitchenREAST SURGERY  05/23/2015.   Wide excision/mastoplasty, axillary dissection. No residual invasive cancer, positive for residual DCIS. 0/2 nodes identified on axillary dissection. (no SLN by technetium or methylene blue)  . CARDIAC CATHETERIZATION    . COLONOSCOPY WITH PROPOFOL N/A 12/10/2016   Procedure: COLONOSCOPY WITH PROPOFOL;  Surgeon: DaLucilla LameMD;  Location: [facility] ENDOSCOPY;  Service: Endoscopy;  Laterality: N/A;  . COLONOSCOPY WITH PROPOFOL N/A 02/13/2017   Procedure: COLONOSCOPY WITH PROPOFOL;  Surgeon: WoLucilla LameMD;  [facility];  Service: Endoscopy;  Laterality: N/A;  . ESOPHAGOGASTRODUODENOSCOPY (EGD) WITH PROPOFOL N/A 12/08/2016   Procedure: ESOPHAGOGASTRODUODENOSCOPY (EGD) WITH PROPOFOL;  Surgeon: DaLucilla LameMD;  Location: [facility] ENDOSCOPY;  Service: Endoscopy;  Laterality: N/A;  . IVC FILTER INSERTION N/A 02/15/2017   Procedure: IVC Filter Insertion;  Surgeon: DeAlgernon HuxleyMD;  [facility];  Service: Cardiovascular;  Laterality: N/A;  . PORTACATH PLACEMENT Right 12-31-14   Dr ByBary Castilla  Social History:   Social History   Socioeconomic History  . Marital status: Divorced    Spouse name: Not on file  . Number of children: Not on file  . Years of education: Not on file  . Highest education level: Not on file  Occupational History  . Not on file  Tobacco Use  . Smoking status: Light Tobacco Smoker    Packs/day: 0.50    Years: 18.00    Pack years: 9.00    Types: Cigarettes  . Smokeless tobacco: Never Used  Substance and Sexual Activity  . Alcohol use: No    Alcohol/week: 0.0 standard drinks  . Drug use: No  . Sexual activity: Not on file  Other Topics Concern  . Not on file  Social History Narrative   [place]   Social Determinants of Health   Financial Resource Strain:   . Difficulty of Paying Living Expenses: Not on file  Food Insecurity:   . Worried About RuCharity fundraisern the Last Year: Not on file  . Ran Out of Food in the Last Year: Not on file  Transportation Needs:   . Lack of Transportation (Medical): Not on file  . Lack of Transportation (Non-Medical): Not on file  Physical Activity:   . Days of Exercise per Week: Not on file  . Minutes of Exercise per Session: Not on file  Stress:   . Feeling of Stress : Not on file  Social Connections:   . Frequency of Communication with Friends and Family: Not on file  . Frequency of Social Gatherings with Friends and Family: Not on file  . Attends Religious Services: Not on file  . Active Member of Clubs or Organizations: Not on file  . Attends ClArchivisteetings: Not on file  .  Marital Status: Not on file  Intimate Partner Violence:   . Fear of Current or Ex-Partner: Not on file  . Emotionally Abused: Not on file  . Physically Abused: Not on file  . Sexually Abused: Not on file    Allergies   No known allergies  Family history:   Family History  Problem Relation Age of Onset  . Breast cancer Maternal Aunt   . Breast cancer Cousin   . Brain cancer Maternal Uncle   . Diabetes Mother   . Hypertension Mother   . Stroke Mother     Current Medications:   Prior to Admission medications   Medication Sig Start Date End Date Taking? Authorizing Provider  flecainide (TAMBOCOR) 50 MG tablet Take 1 tablet (50 mg total) by mouth 2 (two) times daily. 09/04/19   Theora Gianotti, NP  fluconazole (DIFLUCAN) 200 MG tablet Take 1 tablet (200 mg total) by mouth daily. 10/27/19   Cammie Sickle, MD  ipratropium (ATROVENT) 0.03 % nasal spray Place 2 sprays into both nostrils every 12 (twelve) hours. 09/13/19   Cammie Sickle, MD  levocetirizine (XYZAL) 5 MG tablet Take 1 tablet (5 mg  total) by mouth every evening. 10/26/19   Delsa Grana, PA-C  magnesium oxide (MAG-OX) 400 MG tablet Take 1 tablet (400 mg total) by mouth 2 (two) times daily. 08/09/19   Wellington Hampshire, MD  meclizine (ANTIVERT) 25 MG tablet Take 1 tablet (25 mg total) by mouth 3 (three) times daily as needed for dizziness. 08/30/19   Jacquelin Hawking, NP  metoprolol tartrate (LOPRESSOR) 25 MG tablet Take 1 tablet (25 mg total) by mouth 2 (two) times daily. 10/13/19   Cammie Sickle, MD  montelukast (SINGULAIR) 10 MG tablet Take 1 tablet (10 mg total) by mouth at bedtime. 09/08/19   Cammie Sickle, MD  pregabalin (LYRICA) 100 MG capsule Take 1 capsule (100 mg total) by mouth 2 (two) times daily. 10/26/19   Delsa Grana, PA-C  Vitamin D, Ergocalciferol, (DRISDOL) 1.25 MG (50000 UNIT) CAPS capsule TAKE 1 CAPSULE (50,000 UNITS TOTAL) ONCE A WEEK BY MOUTH. 10/16/19   Cammie Sickle, MD    Physical Exam:   Vitals:   11/01/19 1250 11/01/19 1500 11/01/19 1530 11/01/19 1600  BP:  124/89 (!) 121/100 137/86  Pulse:      Resp:  17 18 14   Temp:      TempSrc:      SpO2: 100%     Weight:      Height:         Physical Exam: Blood pressure 137/86, pulse (!) 118, temperature (!) 97.5 F (36.4 C), temperature source Oral, resp. rate 14, height 5' 6"  (1.676 m), weight 72.6 kg, SpO2 100 %. Gen: No acute distress. Head: Normocephalic, atraumatic. Eyes: Pupils equal, round and reactive to light. Extraocular movements intact.  Sclerae nonicteric.  Mouth: Oropharynx without pharyngeal exudates or erythema.  No thrush seen. Neck: Supple, no thyromegaly, no lymphadenopathy, no jugular venous distention. Chest: Lungs are clear to auscultation with good air movement. No rales, rhonchi or wheezes.  CV: Heart sounds are regular with an S1, S2.  Slightly tachycardic.  No murmurs, rubs or gallops.  Abdomen: Soft, nontender, nondistended with normal active bowel sounds. No palpable masses. Extremities:  Extremities are without clubbing, or cyanosis.  Significant swelling of right lower extremity extending from the groin to the foot.  Pedal pulses 2+.  Skin: Warm and dry. No rashes, lesions or wounds Neuro:  Alert and oriented times 3; grossly nonfocal.  Psych: Insight is good and judgment is appropriate. Mood and affect normal.   Data Review:    Labs: Basic Metabolic Panel: Recent Labs  Lab 10/27/19 0857 10/31/19 1453 11/01/19 1425  NA 136 133* 132*  K 3.8 4.6 4.8  CL 102 97* 97*  CO2 26 22 25   GLUCOSE 87 119* 118*  BUN 13 27* 31*  CREATININE 0.71 1.23* 1.27*  CALCIUM 8.8* 9.1 9.1  MG  --   --  2.7*   Liver Function Tests: Recent Labs  Lab 10/27/19 0857 10/31/19 1453 11/01/19 1425  AST 67* 43* 43*  ALT 85* 61* 53*  ALKPHOS 280* 261* 230*  BILITOT 1.4* 2.2* 2.2*  PROT 7.0 7.5 7.0  ALBUMIN 3.5 3.8 3.9   Recent Labs  Lab 11/01/19 1425  LIPASE 29   No results for input(s): AMMONIA in the last 168 hours. CBC: Recent Labs  Lab 10/27/19 0857 10/31/19 1453 11/01/19 1425  WBC 2.5* 3.1* 2.8*  NEUTROABS 1.7 2.4 2.4  HGB 12.1 11.9* 10.5*  HCT 38.1 37.4 32.2*  MCV 98.4 99.2 97.9  PLT 202 192 131*   Cardiac Enzymes: No results for input(s): CKTOTAL, CKMB, CKMBINDEX, TROPONINI in the last 168 hours.  BNP (last 3 results) No results for input(s): PROBNP in the last 8760 hours. CBG: No results for input(s): GLUCAP in the last 168 hours.  Urinalysis    Component Value Date/Time   COLORURINE YELLOW (A) 10/05/2019 0312   APPEARANCEUR CLEAR (A) 10/05/2019 0312   LABSPEC 1.012 10/05/2019 0312   PHURINE 6.0 10/05/2019 0312   GLUCOSEU NEGATIVE 10/05/2019 0312   HGBUR MODERATE (A) 10/05/2019 0312   BILIRUBINUR NEGATIVE 10/05/2019 0312   KETONESUR 5 (A) 10/05/2019 0312   PROTEINUR NEGATIVE 10/05/2019 0312   NITRITE NEGATIVE 10/05/2019 0312   LEUKOCYTESUR NEGATIVE 10/05/2019 0312      Radiographic Studies: US Venous Img Lower Bilateral  Result Date:  11/01/2019 CLINICAL DATA:  64 year old female with leg pain EXAM: BILATERAL LOWER EXTREMITY VENOUS DOPPLER ULTRASOUND TECHNIQUE: Gray-scale sonography with graded compression, as well as color Doppler and duplex ultrasound were performed to evaluate the lower extremity deep venous systems from the level of the common femoral vein and including the common femoral, femoral, profunda femoral, popliteal and calf veins including the posterior tibial, peroneal and gastrocnemius veins when visible. The superficial great saphenous vein was also interrogated. Spectral Doppler was utilized to evaluate flow at rest and with distal augmentation maneuvers in the common femoral, femoral and popliteal veins. COMPARISON:  None. FINDINGS: RIGHT LOWER EXTREMITY Common Femoral Vein: Noncompressible. The lumen is expanded and filled with heterogeneous internal echoes. No evidence of flow on color Doppler imaging. Findings are consistent with occlusive thrombus. Saphenofemoral Junction: Occlusive thrombus extends into the proximal saphenofemoral junction. Profunda Femoral Vein: Occlusive thrombus extends into the profunda femoral vein. Femoral Vein: Occlusive thrombus extends throughout the femoral vein in the thigh. Popliteal Vein: Occlusive thrombus extends through the popliteal vein. Calf Veins: Occlusive thrombus extends into the posterior tibial, peroneal and gastrocnemius veins. Superficial Great Saphenous Vein: Positive thrombus in the small saphenous vein. The great saphenous vein is patent and compressible. Venous Reflux:  None. Other Findings:  None. LEFT LOWER EXTREMITY Common Femoral Vein: Partially compressible. There is some eccentric wall thickening in the left common vein consistent with chronic DVT. Saphenofemoral Junction: Eccentric wall thickening extends into the saphenofemoral junction. Profunda Femoral Vein: Wall thickening extends into the profunda femoral vein. Femoral Vein: Wall  thickening extends into the  femoral vein. Popliteal Vein: The popliteal vein is only partially compressible. The lumen is expanded and filled with heterogeneous echoes. There is a segment of the vein without evidence of vascular flow on color Doppler consistent with occlusive DVT. Calf Veins: No evidence of thrombus. Normal compressibility and flow on color Doppler imaging. Superficial Great Saphenous Vein: Noncompressible. Echogenic material noted throughout the great saphenous vein. Venous Reflux:  None. Other Findings:  None. IMPRESSION: 1. Positive for extensive occlusive and likely acute DVT throughout the right lower extremity. 2. Positive for what appears to be a combination of acute and chronic DVT in the left lower extremity. There is a relatively short segment of occlusive and likely acute DVT in the popliteal vein with findings more consistent with chronic, or nonocclusive DVT in the remaining venous structures. Electronically Signed   By: Jacqulynn Cadet M.D.   On: 11/01/2019 14:36    EKG: Independently reviewed.  Sinus tachycardia, no acute ST-T changes   Assessment/Plan:   Principal Problem:   Acute deep vein thrombosis (DVT) of femoral vein of right lower extremity (HCC) Active Problems:   Carcinoma of upper-outer quadrant of left breast in female, estrogen receptor positive ([place])   Peripheral neuropathy due to chemotherapy [facility])   Acute extensive DVT of right lower extremity/?  Acute on chronic DVT in the left lower extremity: Admit to telemetry.  Treat with IV heparin infusion and monitor PTT per protocol.  Analgesics as needed for pain.  Dr. Kerman Passey, ED physician, said he had already discussed the case with vascular surgeon, Dr. Delana Meyer, who plans to perform thrombectomy on Friday, November 03, 2019.  Hyponatremia/dehydration: Patient received 1 L of Ringer's lactate in the ED.  Monitor BMP.  Sinus tachycardia/history of PSVT: Continue metoprolol and flecainide.  Left breast cancer with metastasis  to the liver, brain and T12: Follow-up with oncologist as an outpatient.  Dysphagia/recent thrush: Chart review shows that fluconazole was prescribed on 10/27/2019 for 14 days.  Peripheral neuropathy: Continue Lyrica    Body mass index is 25.82 kg/m.  Other information:   DVT prophylaxis: IV heparin Code Status: Full code. Family Communication: Plan discussed with patient Disposition Plan: Possible discharge to home in 4 to 5 days Consults called: Vascular surgeon Admission status: Inpatient  The medical decision making on this patient was of high complexity and the patient is at high risk for clinical deterioration, therefore this is a level 3 visit.   Time spent 60 minutes  [place] Hospitalists   How to contact the [facility] Attending or Consulting provider Reserve or covering provider during after hours [place], for this patient?   1. Check the care team in [facility] and look for a) attending/consulting [facility] provider listed and b) the [facility] team listed 2. Log into [email or web address] and use [place]'s universal password to access. If you do not have the password, please contact the hospital operator. 3. Locate the [facility] provider you are looking for under [facility] and page to a number that you can be directly reached. 4. If you still have difficulty reaching the provider, please page the [facility] (Director on Call) for the Hospitalists listed on amion for assistance.  11/01/2019, 5:27 PM

## 2019-11-01 NOTE — Progress Notes (Signed)
Sarah Horne for heparin Indication: DVT  Allergies  Allergen Reactions  . No Known Allergies     Patient Measurements: Height: 5' 6"  (167.6 cm) Weight: 162 lb 7.7 oz (73.7 kg) IBW/kg (Calculated) : 59.3 Heparin Dosing Weight: 72.6 kg  Vital Signs: Temp: 97.8 F (36.6 C) (02/03 1820) Temp Source: Oral (02/03 1820) BP: 129/61 (02/03 1820) Pulse Rate: 110 (02/03 1820)  Labs: Recent Labs    10/31/19 1453 11/01/19 1425 11/01/19 2214  HGB 11.9* 10.5*  --   HCT 37.4 32.2*  --   PLT 192 131*  --   APTT  --  52*  --   LABPROT  --  14.9  --   INR  --  1.2  --   HEPARINUNFRC  --   --  1.61*  CREATININE 1.23* 1.27*  --     Estimated Creatinine Clearance: 45.4 mL/min (A) (by C-G formula based on SCr of 1.27 mg/dL (H)).   Medical History: Past Medical History:  Diagnosis Date  . Chemotherapy-induced peripheral neuropathy ([place])   . Epistaxis    a. 11/2016 in setting of asa/plavix-->silver nitrate cauterization.  . GI bleed    a. 11/2016 Admission w/ GIB and hypovolemic shock req 3u PRBC's;  b. 11/2016 ECG: gastritis & nonbleeding peptic ulcer; c. 11/2016 Conlonoscopy: rectal and sigmoid colonic ulcers.  . Heart attack ([place])    a. 1998 Cath @ [facility]: reportedly no intervention required.  Marland Kitchen Herceptin-induced cardiomyopathy ([place])    a. In the setting of Herceptin Rx for breast cancer (initiated 12/2014); b. 03/2015 MUGA EF 64%; b. 08/2015 MUGA: EF 51%; c. 10/2015 MUGA: EF 44%; d. 11/2015 Echo: EF 45-50%; e. 01/2016 MUGA: EF 60%; f. 06/2016 MUGA EF 65%; g. 10/2016 MUGA: EF 61%;  h. 12/2016 Echo: EF 55-60%, gr1 DD.  Marland Kitchen Hyperlipidemia   . Hypertension   . Neuropathy   . Possible PAD (peripheral artery disease) ([place])    a. 11/2016 LE cyanosis and weak pulses-->CTA w/o significant Ao-BiFem dzs. ? distal dzs-->ASA/Plavix initiated by vascular surgery.  Marland Kitchen PSVT (paroxysmal supraventricular tachycardia) ([place])    a. Dx 11/2016.  . Pulmonary embolism ([place])    a.  12/2016 CTA Chest: small nonocclusive PE in inferior segment of the Left lingula, somewhat eccentric filling defect suggesting chronic rather than acute embolic event; b. 10/8001 LE U/S:  No DVT; c. 12/2016 Echo: Nl RV fxn, nl PASP.  Marland Kitchen Recurrent Metastatic breast cancer ([place])    a. Dx 2016: Stage II, ER positive, PR positive, HER-2/neu overexpressing of the left breast-->chemo/radiation; b. 10/2016 CT Abd/pelvis: diffuse liver mets, ill defined sclerotic bone lesions-T12;  c. 10/2016 MRI brain: metastatic lesion along L temporal lobe (19x23m) w/ extensive surrounding edema & 52mmidline shift to right.  . Sepsis ([place]  . Sinus tachycardia     Assessment: [age]ear old female presented with increased weakness and fall. Patient receiving chemotherapy for breast cancer with mets to liver and brain. Ultrasound lower extremities positive for extensive occlusive and likely acute DVT throughout right lower extremity as well as what appears to be a combination of acute and chronic DVT in the left lower extremity. Pharmacy consulted for heparin drip for VTE treatment.  Goal of Therapy:  Heparin level 0.3-0.7 units/ml Monitor platelets by anticoagulation protocol: Yes   Plan:  Heparin 4000 unit bolus followed by heparin drip at 1100 units/hr. HL at 2200. CBC daily while on heparin drip.  2/3 2214 HL 1.61, SUPRAtherapeutic.  Will  hold heparin x 1 hour then resume at 900 units/hr, will recheck HL 6 hours after heparin resumed.  CBC daily while on Heparin.  Sarah Horne, PharmD 11/01/2019,10:58 PM

## 2019-11-01 NOTE — ED Provider Notes (Signed)
[facility] Emergency Department Provider Note   ____________________________________________   First MD Initiated Contact with Patient 11/01/19 1245     (approximate)  I have reviewed the triage vital signs and the nursing notes.   HISTORY  Chief Complaint Fatigue and Fall    HPI Sarah Horne is a 64 y.o. female with past medical history of metastatic breast cancer who presents to the ED with generalized weakness and fall.  Patient reports that she slid out of bed early this morning and was unable to get herself up.  She had to call EMS, who was able to raise her up and back into bed, but at that time she refused transport.  She then continued to have difficulty standing and walking throughout the day, decided to call EMS to transport her to the ED.  She states she normally walks without difficulty, but that she has noticed some swelling and soreness in both of her legs, right greater than left.  She states that her breast cancer is metastatic to her brain and liver and that she is currently receiving palliative chemotherapy.  She also complains of some difficulty swallowing, with a feeling like there is a knot in her throat when she goes to swallow.  She denies any fevers, cough, chest pain, shortness of breath, abdominal pain, dysuria, or hematuria.        Past Medical History:  Diagnosis Date  . Chemotherapy-induced peripheral neuropathy ([place])   . Epistaxis    a. 11/2016 in setting of asa/plavix-->silver nitrate cauterization.  . GI bleed    a. 11/2016 Admission w/ GIB and hypovolemic shock req 3u PRBC's;  b. 11/2016 ECG: gastritis & nonbleeding peptic ulcer; c. 11/2016 Conlonoscopy: rectal and sigmoid colonic ulcers.  . Heart attack ([place])    a. 1998 Cath @ [facility]: reportedly no intervention required.  Marland Kitchen Herceptin-induced cardiomyopathy ([place])    a. In the setting of Herceptin Rx for breast cancer (initiated 12/2014); b. 03/2015 MUGA EF 64%; b. 08/2015 MUGA:  EF 51%; c. 10/2015 MUGA: EF 44%; d. 11/2015 Echo: EF 45-50%; e. 01/2016 MUGA: EF 60%; f. 06/2016 MUGA EF 65%; g. 10/2016 MUGA: EF 61%;  h. 12/2016 Echo: EF 55-60%, gr1 DD.  Marland Kitchen Hyperlipidemia   . Hypertension   . Neuropathy   . Possible PAD (peripheral artery disease) ([place])    a. 11/2016 LE cyanosis and weak pulses-->CTA w/o significant Ao-BiFem dzs. ? distal dzs-->ASA/Plavix initiated by vascular surgery.  Marland Kitchen PSVT (paroxysmal supraventricular tachycardia) ([place])    a. Dx 11/2016.  . Pulmonary embolism ([place])    a. 12/2016 CTA Chest: small nonocclusive PE in inferior segment of the Left lingula, somewhat eccentric filling defect suggesting chronic rather than acute embolic event; b. 03/5101 LE U/S:  No DVT; c. 12/2016 Echo: Nl RV fxn, nl PASP.  Marland Kitchen Recurrent Metastatic breast cancer ([place])    a. Dx 2016: Stage II, ER positive, PR positive, HER-2/neu overexpressing of the left breast-->chemo/radiation; b. 10/2016 CT Abd/pelvis: diffuse liver mets, ill defined sclerotic bone lesions-T12;  c. 10/2016 MRI brain: metastatic lesion along L temporal lobe (19x57m) w/ extensive surrounding edema & 540mmidline shift to right.  . Sepsis ([place]  . Sinus tachycardia     Patient Active Problem List   Diagnosis Date Noted  . Accidental fall from chair, initial encounter 10/05/2019  . Metastatic breast cancer ([facility] [place]01/03/2020  . Sepsis ([facility]01/03/2020  . Tachyarrhythmia 10/05/2019  . Brain metastasis ([place]/22/2018  . Blood in  stool   . Polyp of sigmoid colon   . Benign neoplasm of transverse colon   . Benign neoplasm of ascending colon   . Lower GI bleed   . Weakness of right lower extremity 02/11/2017  . Orthostatic lightheadedness 01/14/2017  . Atrial tachycardia ([place]) 01/14/2017  . Hypokalemia 01/14/2017  . Pulmonary emboli ([place]) 01/12/2017  . Dehydration 12/25/2016  . Blood loss anemia 12/15/2016  . Ulceration of colon   . Heartburn   . Duodenitis   . Gastritis without bleeding   . GI bleed   . Acute  esophagitis   . Acute esophagogastric ulcer   . Rectal bleed   . Hypovolemic shock ([place])   . Ischemic leg 12/04/2016  . Chemotherapy induced neutropenia ([place]) 11/20/2016  . Palliative care by specialist   . Goals of care, counseling/discussion   . DNR (do not resuscitate) discussion   . Cerebral edema ([place]) 11/10/2016  . Encounter for monitoring cardiotoxic drug therapy 11/09/2016  . Elevated LFTs 10/20/2016  . Peripheral neuropathy due to chemotherapy ([place]) 03/13/2016  . Encounter for antineoplastic chemotherapy 03/13/2016  . HLD (hyperlipidemia) 01/29/2016  . Hyperglycemia, unspecified 01/29/2016  . Cardiomyopathy ([place]) 01/13/2016  . Hypertension 07/18/2015  . Carcinoma of upper-outer quadrant of left breast in female, estrogen receptor positive ([place]) 12/27/2014    Past Surgical History:  Procedure Laterality Date  . BREAST BIOPSY Left 2016   Positive  . BREAST LUMPECTOMY WITH SENTINEL LYMPH NODE BIOPSY Left 05/23/2015   Procedure: LEFT BREAST WIDE EXCISION WITH AXILLARY DISSECTION, MASTOPLASTY ;  Surgeon: [name], MD;  Location: [facility] ORS;  Service: General;  Laterality: Left;  . BREAST SURGERY Left 12/18/14   breast biopsy/INVASIVE DUCTAL CARCINOMA OF BREAST, NOTTINGHAM GRADE 2.  Marland Kitchen BREAST SURGERY  05/23/2015.   Wide excision/mastoplasty, axillary dissection. No residual invasive cancer, positive for residual DCIS. 0/2 nodes identified on axillary dissection. (no SLN by technetium or methylene blue)  . CARDIAC CATHETERIZATION    . COLONOSCOPY WITH PROPOFOL N/A 12/10/2016   Procedure: COLONOSCOPY WITH PROPOFOL;  Surgeon: Lucilla Lame, MD;  Location: [facility] ENDOSCOPY;  Service: Endoscopy;  Laterality: N/A;  . COLONOSCOPY WITH PROPOFOL N/A 02/13/2017   Procedure: COLONOSCOPY WITH PROPOFOL;  Surgeon: Lucilla Lame, MD;  Location: [facility] ENDOSCOPY;  Service: Endoscopy;  Laterality: N/A;  . ESOPHAGOGASTRODUODENOSCOPY (EGD) WITH PROPOFOL N/A 12/08/2016   Procedure:  ESOPHAGOGASTRODUODENOSCOPY (EGD) WITH PROPOFOL;  Surgeon: Lucilla Lame, MD;  Location: [facility] ENDOSCOPY;  Service: Endoscopy;  Laterality: N/A;  . IVC FILTER INSERTION N/A 02/15/2017   Procedure: IVC Filter Insertion;  Surgeon: Algernon Huxley, MD;  Location: [facility];  Service: Cardiovascular;  Laterality: N/A;  . PORTACATH PLACEMENT Right 12-31-14   Dr Bary Castilla    Prior to Admission medications   Medication Sig Start Date End Date Taking? Authorizing Provider  flecainide (TAMBOCOR) 50 MG tablet Take 1 tablet (50 mg total) by mouth 2 (two) times daily. 09/04/19   Theora Gianotti, NP  fluconazole (DIFLUCAN) 200 MG tablet Take 1 tablet (200 mg total) by mouth daily. 10/27/19   Cammie Sickle, MD  ipratropium (ATROVENT) 0.03 % nasal spray Place 2 sprays into both nostrils every 12 (twelve) hours. 09/13/19   Cammie Sickle, MD  levocetirizine (XYZAL) 5 MG tablet Take 1 tablet (5 mg total) by mouth every evening. 10/26/19   Delsa Grana, PA-C  magnesium oxide (MAG-OX) 400 MG tablet Take 1 tablet (400 mg total) by mouth 2 (two) times daily. 08/09/19   Kathlyn Sacramento  A, MD  meclizine (ANTIVERT) 25 MG tablet Take 1 tablet (25 mg total) by mouth 3 (three) times daily as needed for dizziness. 08/30/19   Jacquelin Hawking, NP  metoprolol tartrate (LOPRESSOR) 25 MG tablet Take 1 tablet (25 mg total) by mouth 2 (two) times daily. 10/13/19   Cammie Sickle, MD  montelukast (SINGULAIR) 10 MG tablet Take 1 tablet (10 mg total) by mouth at bedtime. 09/08/19   Cammie Sickle, MD  pregabalin (LYRICA) 100 MG capsule Take 1 capsule (100 mg total) by mouth 2 (two) times daily. 10/26/19   Delsa Grana, PA-C  Vitamin D, Ergocalciferol, (DRISDOL) 1.25 MG (50000 UNIT) CAPS capsule TAKE 1 CAPSULE (50,000 UNITS TOTAL) ONCE A WEEK BY MOUTH. 10/16/19   Cammie Sickle, MD    Allergies No known allergies  Family History  Problem Relation Age of Onset  . Breast cancer Maternal Aunt    . Breast cancer Cousin   . Brain cancer Maternal Uncle   . Diabetes Mother   . Hypertension Mother   . Stroke Mother     Social History Social History   Tobacco Use  . Smoking status: Light Tobacco Smoker    Packs/day: 0.50    Years: 18.00    Pack years: 9.00    Types: Cigarettes  . Smokeless tobacco: Never Used  Substance Use Topics  . Alcohol use: No    Alcohol/week: 0.0 standard drinks  . Drug use: No    Review of Systems  Constitutional: No fever/chills.  Positive for generalized weakness. Eyes: No visual changes. ENT: No sore throat.  Positive for dysphagia. Cardiovascular: Denies chest pain. Respiratory: Denies shortness of breath. Gastrointestinal: No abdominal pain.  No nausea, no vomiting.  No diarrhea.  No constipation. Genitourinary: Negative for dysuria. Musculoskeletal: Negative for back pain.  Positive for leg pain and swelling. Skin: Negative for rash. Neurological: Negative for headaches, focal weakness or numbness.  ____________________________________________   PHYSICAL EXAM:  VITAL SIGNS: ED Triage Vitals  Enc Vitals Group     BP      Pulse      Resp      Temp      Temp src      SpO2      Weight      Height      Head Circumference      Peak Flow      Pain Score      Pain Loc      Pain Edu?      Excl. in [place]?     Constitutional: Alert and oriented. Eyes: Conjunctivae are normal. Head: Atraumatic. Nose: No congestion/rhinnorhea. Mouth/Throat: Mucous membranes are dry. Neck: Normal ROM Cardiovascular: Normal rate, regular rhythm. Grossly normal heart sounds.  Chest wall Mediport noted. Respiratory: Normal respiratory effort.  No retractions. Lungs CTAB. Gastrointestinal: Soft and nontender. No distention. Genitourinary: deferred Musculoskeletal: Woody edema to right lower extremity with mild calf tenderness, trace edema to left lower extremity with no tenderness.  2+ DP pulses bilaterally. Neurologic:  Normal speech and  language. No gross focal neurologic deficits are appreciated. Skin:  Skin is warm, dry and intact. No rash noted. Psychiatric: Mood and affect are normal. Speech and behavior are normal.  ____________________________________________   LABS (all labs ordered are listed, but only abnormal results are displayed)  Labs Reviewed  CBC WITH DIFFERENTIAL/PLATELET - Abnormal; Notable for the following components:      Result Value   WBC 2.8 (*)    RBC  3.29 (*)    Hemoglobin 10.5 (*)    HCT 32.2 (*)    RDW 18.5 (*)    Platelets 131 (*)    nRBC 2.8 (*)    Lymphs Abs 0.4 (*)    All other components within normal limits  COMPREHENSIVE METABOLIC PANEL - Abnormal; Notable for the following components:   Sodium 132 (*)    Chloride 97 (*)    Glucose, Bld 118 (*)    BUN 31 (*)    Creatinine, Ser 1.27 (*)    AST 43 (*)    ALT 53 (*)    Alkaline Phosphatase 230 (*)    Total Bilirubin 2.2 (*)    GFR calc non Af Amer 44 (*)    GFR calc Af Amer 51 (*)    All other components within normal limits  MAGNESIUM - Abnormal; Notable for the following components:   Magnesium 2.7 (*)    All other components within normal limits  LIPASE, BLOOD  URINALYSIS, COMPLETE (UACMP) WITH MICROSCOPIC   ____________________________________________  EKG  ED ECG REPORT I, Blake Divine, the attending physician, personally viewed and interpreted this ECG.   Date: 11/01/2019  EKG Time: 13:07  Rate: 115  Rhythm: sinus tachycardia  Axis: Normal  Intervals:Prolonged QT  ST&T Change: None   PROCEDURES  Procedure(s) performed (including Critical Care):  Procedures   ____________________________________________   INITIAL IMPRESSION / ASSESSMENT AND PLAN / ED COURSE       64 year old female with history of metastatic breast cancer presents to the ED with increased generalized weakness and difficulty walking today, has had difficulty swallowing as well as swelling and pain in her right lower extremity  recently.  She is neurovascularly intact to her bilateral lower extremities, however ultrasound is significant for diffuse DVT in her right lower extremity.  Lab work is pending at this time, although her there is no obvious infectious process.  We will hydrate with IV fluids given likely dehydration.  Patient did have recent esophagram showing esophageal web, likely contributing to her symptoms.  Patient turned over to oncoming provider pending lab results, she will likely require admission for vascular surgery to address her DVT given her inability to walk.      ____________________________________________   FINAL CLINICAL IMPRESSION(S) / ED DIAGNOSES  Final diagnoses:  Generalized weakness  Acute deep vein thrombosis (DVT) of both lower extremities, unspecified vein [facility])     ED Discharge Orders    None       Note:  This document was prepared using Dragon voice recognition software and may include unintentional dictation errors.   Blake Divine, MD 11/01/19 [phone]

## 2019-11-01 NOTE — ED Notes (Signed)
Report called to wyn[place] rn floor nurse.

## 2019-11-01 NOTE — ED Notes (Signed)
Right chest port accessed at this time by this RN. Blood return noted prior to flushing line.

## 2019-11-01 NOTE — ED Notes (Signed)
Resumed care from [place] rn.  Pt alert.  Iv meds infusing.  Sinus tach on monitor.

## 2019-11-01 NOTE — Plan of Care (Signed)
Dvt will resolve

## 2019-11-01 NOTE — ED Triage Notes (Signed)
Pt arrives via ems from home with reports of sliding out of bed this morning. ems team able to get pt up, pt initially refused to come to hospital, but after walking around house some pt agreed to come to ED due to increased weakness. Pt currently being treated with chemo for breast, liver, brain cancer. Pt a&o x 4 on arrival.  Pt reports not being able to eat and drink like normal lately. Pt states " I feel like a knot is in there and I cant swallow". NAD on arrival.

## 2019-11-02 ENCOUNTER — Other Ambulatory Visit (INDEPENDENT_AMBULATORY_CARE_PROVIDER_SITE_OTHER): Payer: Self-pay | Admitting: Vascular Surgery

## 2019-11-02 ENCOUNTER — Ambulatory Visit: Payer: Medicare HMO | Admitting: Gastroenterology

## 2019-11-02 ENCOUNTER — Inpatient Hospital Stay: Payer: Medicare HMO

## 2019-11-02 ENCOUNTER — Other Ambulatory Visit: Payer: Self-pay

## 2019-11-02 ENCOUNTER — Encounter: Payer: Self-pay | Admitting: Internal Medicine

## 2019-11-02 DIAGNOSIS — Z5189 Encounter for other specified aftercare: Secondary | ICD-10-CM | POA: Insufficient documentation

## 2019-11-02 DIAGNOSIS — R7989 Other specified abnormal findings of blood chemistry: Secondary | ICD-10-CM | POA: Insufficient documentation

## 2019-11-02 DIAGNOSIS — R1310 Dysphagia, unspecified: Secondary | ICD-10-CM

## 2019-11-02 DIAGNOSIS — T451X5A Adverse effect of antineoplastic and immunosuppressive drugs, initial encounter: Secondary | ICD-10-CM | POA: Insufficient documentation

## 2019-11-02 DIAGNOSIS — R5383 Other fatigue: Secondary | ICD-10-CM | POA: Insufficient documentation

## 2019-11-02 DIAGNOSIS — R531 Weakness: Secondary | ICD-10-CM

## 2019-11-02 DIAGNOSIS — I82403 Acute embolism and thrombosis of unspecified deep veins of lower extremity, bilateral: Secondary | ICD-10-CM

## 2019-11-02 DIAGNOSIS — D701 Agranulocytosis secondary to cancer chemotherapy: Secondary | ICD-10-CM | POA: Insufficient documentation

## 2019-11-02 DIAGNOSIS — Q394 Esophageal web: Secondary | ICD-10-CM

## 2019-11-02 DIAGNOSIS — K2210 Ulcer of esophagus without bleeding: Secondary | ICD-10-CM

## 2019-11-02 LAB — CBC
HCT: 31 % — ABNORMAL LOW (ref 36.0–46.0)
Hemoglobin: 10.3 g/dL — ABNORMAL LOW (ref 12.0–15.0)
MCH: 32.6 pg (ref 26.0–34.0)
MCHC: 33.2 g/dL (ref 30.0–36.0)
MCV: 98.1 fL (ref 80.0–100.0)
Platelets: 138 10*3/uL — ABNORMAL LOW (ref 150–400)
RBC: 3.16 MIL/uL — ABNORMAL LOW (ref 3.87–5.11)
RDW: 18.8 % — ABNORMAL HIGH (ref 11.5–15.5)
WBC: 2.2 10*3/uL — ABNORMAL LOW (ref 4.0–10.5)
nRBC: 1.3 % — ABNORMAL HIGH (ref 0.0–0.2)

## 2019-11-02 LAB — COMPREHENSIVE METABOLIC PANEL
ALT: 46 U/L — ABNORMAL HIGH (ref 0–44)
AST: 44 U/L — ABNORMAL HIGH (ref 15–41)
Albumin: 3.8 g/dL (ref 3.5–5.0)
Alkaline Phosphatase: 193 U/L — ABNORMAL HIGH (ref 38–126)
Anion gap: 10 (ref 5–15)
BUN: 28 mg/dL — ABNORMAL HIGH (ref 8–23)
CO2: 25 mmol/L (ref 22–32)
Calcium: 8.9 mg/dL (ref 8.9–10.3)
Chloride: 101 mmol/L (ref 98–111)
Creatinine, Ser: 0.98 mg/dL (ref 0.44–1.00)
GFR calc Af Amer: >60 mL/min (ref >60)
GFR calc non Af Amer: >60 mL/min (ref >60)
Glucose, Bld: 90 mg/dL (ref 70–99)
Potassium: 4.5 mmol/L (ref 3.5–5.1)
Sodium: 136 mmol/L (ref 135–145)
Total Bilirubin: 1.8 mg/dL — ABNORMAL HIGH (ref 0.3–1.2)
Total Protein: 7.1 g/dL (ref 6.5–8.1)

## 2019-11-02 LAB — HEPARIN LEVEL (UNFRACTIONATED)
Heparin Unfractionated: 1.16 IU/mL — ABNORMAL HIGH (ref 0.30–0.70)
Heparin Unfractionated: 0.35 IU/mL (ref 0.30–0.70)
Heparin Unfractionated: 0.34 IU/mL (ref 0.30–0.70)

## 2019-11-02 MED ORDER — GADOBUTROL 1 MMOL/ML IV SOLN
7.0000 mL | Freq: Once | INTRAVENOUS | Status: AC | PRN
  Administered 2019-11-02: 13:00:00 7 mL via INTRAVENOUS

## 2019-11-02 MED ORDER — SODIUM CHLORIDE 0.9 % IV SOLN: INTRAVENOUS | Status: DC

## 2019-11-02 MED ORDER — CHLORHEXIDINE GLUCONATE CLOTH 2 % EX PADS
6.0000 | MEDICATED_PAD | Freq: Every day | CUTANEOUS | Status: DC
  Administered 2019-11-02 – 2019-11-07 (×6): 6 via TOPICAL

## 2019-11-02 MED ORDER — MORPHINE SULFATE (PF) 4 MG/ML IV SOLN: 4.0000 mg | Freq: Four times a day (QID) | INTRAVENOUS | Status: DC | PRN

## 2019-11-02 MED ORDER — OXYCODONE HCL 5 MG PO TABS
5.0000 mg | ORAL_TABLET | ORAL | Status: DC | PRN
  Administered 2019-11-02 – 2019-11-05 (×3): 5 mg via ORAL
  Filled 2019-11-02 (×3): qty 1

## 2019-11-02 NOTE — Progress Notes (Signed)
[place] for heparin Indication: DVT  Allergies  Allergen Reactions  . No Known Allergies     Patient Measurements: Height: 5' 6"  (167.6 cm) Weight: 162 lb 7.7 oz (73.7 kg) IBW/kg (Calculated) : 59.3 Heparin Dosing Weight: 72.6 kg  Vital Signs: Temp: 98.5 F (36.9 C) (02/04 0749) Temp Source: Oral (02/04 0749) BP: 120/79 (02/04 0749) Pulse Rate: 103 (02/04 0749)  Labs: Recent Labs    10/31/19 1453 10/31/19 1453 11/01/19 1425 11/01/19 2214 11/02/19 0623  HGB 11.9*   < > 10.5*  --  10.3*  HCT 37.4  --  32.2*  --  31.0*  PLT 192  --  131*  --  138*  APTT  --   --  52*  --   --   LABPROT  --   --  14.9  --   --   INR  --   --  1.2  --   --   HEPARINUNFRC  --   --   --  1.61* 1.16*  CREATININE 1.23*  --  1.27*  --  0.98   < > = values in this interval not displayed.    Estimated Creatinine Clearance: 58.8 mL/min (by C-G formula based on SCr of 0.98 mg/dL).   Medical History: Past Medical History:  Diagnosis Date  . Chemotherapy-induced peripheral neuropathy ([place])   . Epistaxis    a. 11/2016 in setting of asa/plavix-->silver nitrate cauterization.  . GI bleed    a. 11/2016 Admission w/ GIB and hypovolemic shock req 3u PRBC's;  b. 11/2016 ECG: gastritis & nonbleeding peptic ulcer; c. 11/2016 Conlonoscopy: rectal and sigmoid colonic ulcers.  . Heart attack ([place])    a. 1998 Cath @ [facility]: reportedly no intervention required.  Marland Kitchen Herceptin-induced cardiomyopathy ([place])    a. In the setting of Herceptin Rx for breast cancer (initiated 12/2014); b. 03/2015 MUGA EF 64%; b. 08/2015 MUGA: EF 51%; c. 10/2015 MUGA: EF 44%; d. 11/2015 Echo: EF 45-50%; e. 01/2016 MUGA: EF 60%; f. 06/2016 MUGA EF 65%; g. 10/2016 MUGA: EF 61%;  h. 12/2016 Echo: EF 55-60%, gr1 DD.  Marland Kitchen Hyperlipidemia   . Hypertension   . Neuropathy   . Possible PAD (peripheral artery disease) ([place])    a. 11/2016 LE cyanosis and weak pulses-->CTA w/o significant Ao-BiFem dzs. ? distal  dzs-->ASA/Plavix initiated by vascular surgery.  Marland Kitchen PSVT (paroxysmal supraventricular tachycardia) ([place])    a. Dx 11/2016.  . Pulmonary embolism ([place])    a. 12/2016 CTA Chest: small nonocclusive PE in inferior segment of the Left lingula, somewhat eccentric filling defect suggesting chronic rather than acute embolic event; b. 09/6107 LE U/S:  No DVT; c. 12/2016 Echo: Nl RV fxn, nl PASP.  Marland Kitchen Recurrent Metastatic breast cancer ([place])    a. Dx 2016: Stage II, ER positive, PR positive, HER-2/neu overexpressing of the left breast-->chemo/radiation; b. 10/2016 CT Abd/pelvis: diffuse liver mets, ill defined sclerotic bone lesions-T12;  c. 10/2016 MRI brain: metastatic lesion along L temporal lobe (19x34m) w/ extensive surrounding edema & 528mmidline shift to right.  . Sepsis ([facility] Covina  . Sinus tachycardia     Assessment: [age]ear old female presented with increased weakness and fall. Patient receiving chemotherapy for breast cancer with mets to liver and brain. Ultrasound lower extremities positive for extensive occlusive and likely acute DVT throughout right lower extremity as well as what appears to be a combination of acute and chronic DVT in the left lower extremity. Pharmacy consulted for  heparin drip for VTE treatment.  No PTA anticoagulant of record.  Started with heparin 4000 unit bolus followed by heparin drip at 1100 units/hr  02/03 2214 HL 1.61 (held for 1 hour and reduced rate to 900 u/hr) 02/04 2774 HL 1.16  Goal of Therapy:  Heparin level 0.3-0.7 units/ml Monitor platelets by anticoagulation protocol: Yes   Plan:  02/04 0623 HL 1.16    Will hold infusion for 1 hour (confirmed with nursing) and reduce rate to 750 units/hr.  Will recheck HL in 6 hours after infusion resumes per protocol.  CBC daily while on Heparin.  Lu Duffel, PharmD, BCPS Clinical Pharmacist 11/02/2019 8:03 AM

## 2019-11-02 NOTE — Progress Notes (Addendum)
Progress Note    Sarah Horne  LKG:[id number] DOB: 22-Mar-1956  DOA: 11/01/2019 PCP: Delsa Grana, PA-C      Brief Narrative:    Medical records reviewed and are as summarized below:  Sarah Horne is an 64 y.o. female with medical history significant for pulmonary embolism in 2018, Herceptin-induced cardiomyopathy, PSVT, sinus tachycardia, chemotherapy-induced peripheral neuropathy, epistaxis, CAD (heart attack in 1998), stage IV left breast cancer with metastasis to the liver, brain, T12 vertebra, hypertension, hyperlipidemia.  She presented to the hospital because of generalized weakness, right leg pain, right lower extremity swelling and difficulty walking.Marland Kitchen  She was found to have extensive DVT of the right lower extremity.      Assessment/Plan:   Principal Problem:   Acute deep vein thrombosis (DVT) of femoral vein of right lower extremity (HCC) Active Problems:   Carcinoma of upper-outer quadrant of left breast in female, estrogen receptor positive ([place])   Peripheral neuropathy due to chemotherapy [facility])   Acute extensive DVT of right lower extremity/?  Acute on chronic DVT in the left lower extremity: Continue IV heparin infusion and monitor PTT per protocol.  Analgesics as needed for pain.  Plan to perform thrombectomy tomorrow (on Friday, November 03, 2019).   Follow-up with with vascular surgeon for further recommendations.  MRI brain was done today to evaluate brain metastasis (as recommended by her oncologist, Dr. Rogue Bussing) because of plan for thrombectomy.  Patient still has brain metastasis so vascular surgeon has aborted plan for thrombectomy and in place of that patient will have IVC filter placed tomorrow.  Hyponatremia/dehydration/elevated creatinine: Improved with IV fluids.   Sinus tachycardia/history of PSVT: Continue metoprolol and flecainide.  Left breast cancer with metastasis to the liver, brain and T12: Follow-up with oncologist as an  outpatient.  Dysphagia/cervical esophageal web recent thrush: Consulted gastroenterologist, Dr. Marius Ditch. Chart review shows that fluconazole was prescribed on 10/27/2019 for 14 days (to be completed on 11/09/2019).  Peripheral neuropathy: Continue Lyrica  Elevated liver enzymes is likely due to liver metastasis.  Body mass index is 26.22 kg/m.   Family Communication/Anticipated D/C date and plan/Code Status   DVT prophylaxis: IV heparin Code Status: Full code Family Communication: Plan discussed with the patient Disposition Plan: Possible discharge to home in 3 to 4 days      Subjective:   No new complaints.  Right lower extremity pain is better.  She said her swallowing has gotten better over the past few days.  Objective:    Vitals:   11/01/19 1820 11/01/19 2152 11/01/19 2317 11/02/19 0749  BP: 129/61 117/83 120/83 120/79  Pulse: (!) 110 (!) 104 (!) 106 (!) 103  Resp: 17  16 17   Temp: 97.8 F (36.6 C)  98.4 F (36.9 C) 98.5 F (36.9 C)  TempSrc: Oral  Oral Oral  SpO2: 100%  100% 98%  Weight: 73.7 kg     Height: 5\' 6"  (1.676 m)       Intake/Output Summary (Last 24 hours) at 11/02/2019 1442 Last data filed at 11/02/2019 0526 Gross per 24 hour  Intake 88.99 ml  Output 150 ml  Net -61.01 ml   Filed Weights   11/01/19 1249 11/01/19 1820  Weight: 72.6 kg 73.7 kg    Exam:  GEN: NAD SKIN: No rash EYES: EOMI ENT: MMM CV: RRR PULM: CTA B ABD: soft, ND, NT, +BS CNS: AAO x 3, non focal EXT: Significant swelling of entire right lower extremity from thigh down to the foot.  No erythema or tenderness.   Data Reviewed:   I have personally reviewed following labs and imaging studies:  Labs: Labs show the following:   Basic Metabolic Panel: Recent Labs  Lab 10/27/19 0857 10/27/19 0857 10/31/19 1453 10/31/19 1453 11/01/19 1425 11/02/19 0623  NA 136  --  133*  --  132* 136  K 3.8   < > 4.6   < > 4.8 4.5  CL 102  --  97*  --  97* 101  CO2 26  --  22  --   25 25  GLUCOSE 87  --  119*  --  118* 90  BUN 13  --  27*  --  31* 28*  CREATININE 0.71  --  1.23*  --  1.27* 0.98  CALCIUM 8.8*  --  9.1  --  9.1 8.9  MG  --   --   --   --  2.7*  --    < > = values in this interval not displayed.   GFR Estimated Creatinine Clearance: 58.8 mL/min (by C-G formula based on SCr of 0.98 mg/dL). Liver Function Tests: Recent Labs  Lab 10/27/19 0857 10/31/19 1453 11/01/19 1425 11/02/19 0623  AST 67* 43* 43* 44*  ALT 85* 61* 53* 46*  ALKPHOS 280* 261* 230* 193*  BILITOT 1.4* 2.2* 2.2* 1.8*  PROT 7.0 7.5 7.0 7.1  ALBUMIN 3.5 3.8 3.9 3.8   Recent Labs  Lab 11/01/19 1425  LIPASE 29   No results for input(s): AMMONIA in the last 168 hours. Coagulation profile Recent Labs  Lab 11/01/19 1425  INR 1.2    CBC: Recent Labs  Lab 10/27/19 0857 10/31/19 1453 11/01/19 1425 11/02/19 0623  WBC 2.5* 3.1* 2.8* 2.2*  NEUTROABS 1.7 2.4 2.4  --   HGB 12.1 11.9* 10.5* 10.3*  HCT 38.1 37.4 32.2* 31.0*  MCV 98.4 99.2 97.9 98.1  PLT 202 192 131* 138*   Cardiac Enzymes: No results for input(s): CKTOTAL, CKMB, CKMBINDEX, TROPONINI in the last 168 hours. BNP (last 3 results) No results for input(s): PROBNP in the last 8760 hours. CBG: No results for input(s): GLUCAP in the last 168 hours. D-Dimer: No results for input(s): DDIMER in the last 72 hours. Hgb A1c: No results for input(s): HGBA1C in the last 72 hours. Lipid Profile: No results for input(s): CHOL, HDL, LDLCALC, TRIG, CHOLHDL, LDLDIRECT in the last 72 hours. Thyroid function studies: No results for input(s): TSH, T4TOTAL, T3FREE, THYROIDAB in the last 72 hours.  Invalid input(s): FREET3 Anemia work up: No results for input(s): VITAMINB12, FOLATE, FERRITIN, TIBC, IRON, RETICCTPCT in the last 72 hours. Sepsis Labs: Recent Labs  Lab 10/27/19 0857 10/31/19 1453 11/01/19 1425 11/02/19 0623  WBC 2.5* 3.1* 2.8* 2.2*    Microbiology Recent Results (from the past 240 hour(s))   Respiratory Panel by RT PCR (Flu A&B, Covid) - Nasopharyngeal Swab     Status: None   Collection Time: 11/01/19  3:53 PM   Specimen: Nasopharyngeal Swab  Result Value Ref Range Status   SARS Coronavirus 2 by RT PCR NEGATIVE NEGATIVE Final    Comment: (NOTE) SARS-CoV-2 target nucleic acids are NOT DETECTED. The SARS-CoV-2 RNA is generally detectable in upper respiratoy specimens during the acute phase of infection. The lowest concentration of SARS-CoV-2 viral copies this assay can detect is 131 copies/mL. A negative result does not preclude SARS-Cov-2 infection and should not be used as the sole basis for treatment or other patient management decisions. A negative result may  occur with  improper specimen collection/handling, submission of specimen other than nasopharyngeal swab, presence of viral mutation(s) within the areas targeted by this assay, and inadequate number of viral copies (<131 copies/mL). A negative result must be combined with clinical observations, patient history, and epidemiological information. The expected result is Negative. Fact Sheet for Patients:  PinkCheek.be Fact Sheet for Healthcare Providers:  GravelBags.it This test is not yet ap proved or cleared by the [place] FDA and  has been authorized for detection and/or diagnosis of SARS-CoV-2 by FDA under an Emergency Use Authorization (EUA). This EUA will remain  in effect (meaning this test can be used) for the duration of the COVID-19 declaration under Section 564(b)(1) of the Act, 21 U.S.C. section 360bbb-3(b)(1), unless the authorization is terminated or revoked sooner.    Influenza A by PCR NEGATIVE NEGATIVE Final   Influenza B by PCR NEGATIVE NEGATIVE Final    Comment: (NOTE) The Xpert Xpress SARS-CoV-2/FLU/RSV assay is intended as an aid in  the diagnosis of influenza from Nasopharyngeal swab specimens and  should not be used as a sole  basis for treatment. Nasal washings and  aspirates are unacceptable for Xpert Xpress SARS-CoV-2/FLU/RSV  testing. Fact Sheet for Patients: PinkCheek.be Fact Sheet for Healthcare Providers: GravelBags.it This test is not yet approved or cleared by the [place] FDA and  has been authorized for detection and/or diagnosis of SARS-CoV-2 by  FDA under an Emergency Use Authorization (EUA). This EUA will remain  in effect (meaning this test can be used) for the duration of the  Covid-19 declaration under Section 564(b)(1) of the Act, 21  U.S.C. section 360bbb-3(b)(1), unless the authorization is  terminated or revoked. Performed at [facility], [address] [place], [place] [address]     Procedures and diagnostic studies:  MR BRAIN W WO CONTRAST  Result Date: 11/02/2019 CLINICAL DATA:  Metastatic breast cancer, follow-up EXAM: MRI HEAD WITHOUT AND WITH CONTRAST TECHNIQUE: Multiplanar, multiecho pulse sequences of the brain and surrounding structures were obtained without and with intravenous contrast. CONTRAST:  34mL GADAVIST GADOBUTROL 1 MMOL/ML IV SOLN COMPARISON:  08/31/2019 FINDINGS: There is motion artifact particularly on postcontrast imaging. Brain: There is suboptimal comparison with the prior study due to artifact. Enhancing lesions do not appear substantially changed. Several of the lesions are not identified, but this is probably secondary to small size in the setting of artifact. There is no definite new lesion. No new edema. There is no acute infarction or intracranial hemorrhage. Confluent areas of T2 hyperintensity in the supratentorial white matter likely reflects therapy related change. There is no hydrocephalus. Vascular: Major vessel flow voids at the skull base are preserved. Skull and upper cervical spine: Normal marrow signal is preserved. Sinuses/Orbits: Paranasal sinuses are aerated. Orbits are  unremarkable. Other: Sella is unremarkable.  Mastoid air cells are clear. IMPRESSION: Suboptimal evaluation and comparison due to motion artifact. However, there is no evidence of new or progressive disease. Electronically Signed   By: Macy Mis M.D.   On: 11/02/2019 12:57   US Venous Img Lower Bilateral  Result Date: 11/01/2019 CLINICAL DATA:  64 year old female with leg pain EXAM: BILATERAL LOWER EXTREMITY VENOUS DOPPLER ULTRASOUND TECHNIQUE: Gray-scale sonography with graded compression, as well as color Doppler and duplex ultrasound were performed to evaluate the lower extremity deep venous systems from the level of the common femoral vein and including the common femoral, femoral, profunda femoral, popliteal and calf veins including the posterior tibial, peroneal and gastrocnemius veins when visible.  The superficial great saphenous vein was also interrogated. Spectral Doppler was utilized to evaluate flow at rest and with distal augmentation maneuvers in the common femoral, femoral and popliteal veins. COMPARISON:  None. FINDINGS: RIGHT LOWER EXTREMITY Common Femoral Vein: Noncompressible. The lumen is expanded and filled with heterogeneous internal echoes. No evidence of flow on color Doppler imaging. Findings are consistent with occlusive thrombus. Saphenofemoral Junction: Occlusive thrombus extends into the proximal saphenofemoral junction. Profunda Femoral Vein: Occlusive thrombus extends into the profunda femoral vein. Femoral Vein: Occlusive thrombus extends throughout the femoral vein in the thigh. Popliteal Vein: Occlusive thrombus extends through the popliteal vein. Calf Veins: Occlusive thrombus extends into the posterior tibial, peroneal and gastrocnemius veins. Superficial Great Saphenous Vein: Positive thrombus in the small saphenous vein. The great saphenous vein is patent and compressible. Venous Reflux:  None. Other Findings:  None. LEFT LOWER EXTREMITY Common Femoral Vein: Partially  compressible. There is some eccentric wall thickening in the left common vein consistent with chronic DVT. Saphenofemoral Junction: Eccentric wall thickening extends into the saphenofemoral junction. Profunda Femoral Vein: Wall thickening extends into the profunda femoral vein. Femoral Vein: Wall thickening extends into the femoral vein. Popliteal Vein: The popliteal vein is only partially compressible. The lumen is expanded and filled with heterogeneous echoes. There is a segment of the vein without evidence of vascular flow on color Doppler consistent with occlusive DVT. Calf Veins: No evidence of thrombus. Normal compressibility and flow on color Doppler imaging. Superficial Great Saphenous Vein: Noncompressible. Echogenic material noted throughout the great saphenous vein. Venous Reflux:  None. Other Findings:  None. IMPRESSION: 1. Positive for extensive occlusive and likely acute DVT throughout the right lower extremity. 2. Positive for what appears to be a combination of acute and chronic DVT in the left lower extremity. There is a relatively short segment of occlusive and likely acute DVT in the popliteal vein with findings more consistent with chronic, or nonocclusive DVT in the remaining venous structures. Electronically Signed   By: Jacqulynn Cadet M.D.   On: 11/01/2019 14:36    Medications:   . cetirizine  10 mg Oral Daily  . flecainide  50 mg Oral BID  . fluconazole  200 mg Oral Daily  . ipratropium  2 spray Each Nare BID  . magnesium oxide  400 mg Oral BID  . metoprolol tartrate  25 mg Oral BID  . montelukast  10 mg Oral QHS  . pregabalin  100 mg Oral BID   Continuous Infusions: . heparin 750 Units/hr (11/02/19 1025)     LOS: 1 day   Danyia Borunda  [facility]     11/02/2019, 2:42 PM

## 2019-11-02 NOTE — Progress Notes (Signed)
I checked on patient; on IV heparin for DVT agree with thrombectomy as planned per vascular.  I would recommend imaging the brain if thrombolytics are ordered-given history of treated brain metastases.  Discussed that-chemotherapy will have to be rescheduled for next week.

## 2019-11-02 NOTE — Progress Notes (Signed)
[place] for heparin Indication: DVT  Allergies  Allergen Reactions  . No Known Allergies    Patient Measurements: Height: 5' 6"  (167.6 cm) Weight: 162 lb 7.7 oz (73.7 kg) IBW/kg (Calculated) : 59.3 Heparin Dosing Weight: 72.6 kg  Vital Signs: Temp: 98.6 F (37 C) (02/04 1633) Temp Source: Oral (02/04 1633) BP: 112/77 (02/04 2233) Pulse Rate: 110 (02/04 2233)  Labs: Recent Labs    10/31/19 1453 10/31/19 1453 11/01/19 1425 11/01/19 2214 11/02/19 0623 11/02/19 1558 11/02/19 2148  HGB 11.9*   < > 10.5*  --  10.3*  --   --   HCT 37.4  --  32.2*  --  31.0*  --   --   PLT 192  --  131*  --  138*  --   --   APTT  --   --  52*  --   --   --   --   LABPROT  --   --  14.9  --   --   --   --   INR  --   --  1.2  --   --   --   --   HEPARINUNFRC  --   --   --    < > 1.16* 0.35 0.34  CREATININE 1.23*  --  1.27*  --  0.98  --   --    < > = values in this interval not displayed.    Estimated Creatinine Clearance: 58.8 mL/min (by C-G formula based on SCr of 0.98 mg/dL).  Medical History: Past Medical History:  Diagnosis Date  . Chemotherapy-induced peripheral neuropathy ([place])   . Epistaxis    a. 11/2016 in setting of asa/plavix-->silver nitrate cauterization.  . GI bleed    a. 11/2016 Admission w/ GIB and hypovolemic shock req 3u PRBC's;  b. 11/2016 ECG: gastritis & nonbleeding peptic ulcer; c. 11/2016 Conlonoscopy: rectal and sigmoid colonic ulcers.  . Heart attack ([place])    a. 1998 Cath @ [facility]: reportedly no intervention required.  Marland Kitchen Herceptin-induced cardiomyopathy ([place])    a. In the setting of Herceptin Rx for breast cancer (initiated 12/2014); b. 03/2015 MUGA EF 64%; b. 08/2015 MUGA: EF 51%; c. 10/2015 MUGA: EF 44%; d. 11/2015 Echo: EF 45-50%; e. 01/2016 MUGA: EF 60%; f. 06/2016 MUGA EF 65%; g. 10/2016 MUGA: EF 61%;  h. 12/2016 Echo: EF 55-60%, gr1 DD.  Marland Kitchen Hyperlipidemia   . Hypertension   . Neuropathy   . Possible PAD (peripheral artery  disease) ([place])    a. 11/2016 LE cyanosis and weak pulses-->CTA w/o significant Ao-BiFem dzs. ? distal dzs-->ASA/Plavix initiated by vascular surgery.  Marland Kitchen PSVT (paroxysmal supraventricular tachycardia) ([place])    a. Dx 11/2016.  . Pulmonary embolism ([place])    a. 12/2016 CTA Chest: small nonocclusive PE in inferior segment of the Left lingula, somewhat eccentric filling defect suggesting chronic rather than acute embolic event; b. 04/2504 LE U/S:  No DVT; c. 12/2016 Echo: Nl RV fxn, nl PASP.  Marland Kitchen Recurrent Metastatic breast cancer ([place])    a. Dx 2016: Stage II, ER positive, PR positive, HER-2/neu overexpressing of the left breast-->chemo/radiation; b. 10/2016 CT Abd/pelvis: diffuse liver mets, ill defined sclerotic bone lesions-T12;  c. 10/2016 MRI brain: metastatic lesion along L temporal lobe (19x42m) w/ extensive surrounding edema & 588mmidline shift to right.  . [place]epsis [place]  . Sinus tachycardia     Assessment: [age]ear old female presented with increased weakness and fall.  Patient receiving chemotherapy for breast cancer with mets to liver and brain. Ultrasound lower extremities positive for extensive occlusive and likely acute DVT throughout right lower extremity as well as what appears to be a combination of acute and chronic DVT in the left lower extremity. Pharmacy consulted for heparin drip for VTE treatment.  No PTA anticoagulant of record.  Started with heparin 4000 unit bolus followed by heparin drip at 1100 units/hr  02/03 2214 HL 1.61 (held for 1 hour and reduced rate to 900 u/hr) 02/04 9914 HL 1.16 (held for 1 hour and reduced rate to 750 u/hr) 02/04 1558 HL 0.35, therapeutic 02/04 2148 HL 0.34, therapeutic x 2  Goal of Therapy:  Heparin level 0.3-0.7 units/ml Monitor platelets by anticoagulation protocol: Yes   Plan:  Currently therapeutic. Will continue infusion rate of 750 units/hr.  Will recheck HL in am.  CBC daily while on Heparin.  [name], PharmD Clinical  Pharmacist 11/02/2019 11:26 PM

## 2019-11-02 NOTE — Progress Notes (Signed)
[place] for heparin Indication: DVT  Allergies  Allergen Reactions  . No Known Allergies     Patient Measurements: Height: '5\' 6"'$  (167.6 cm) Weight: 162 lb 7.7 oz (73.7 kg) IBW/kg (Calculated) : 59.3 Heparin Dosing Weight: 72.6 kg  Vital Signs: Temp: 98.6 F (37 C) (02/04 1633) Temp Source: Oral (02/04 1633) BP: 121/76 (02/04 1633) Pulse Rate: 101 (02/04 1633)  Labs: Recent Labs    10/31/19 1453 10/31/19 1453 11/01/19 1425 11/01/19 2214 11/02/19 0623 11/02/19 1558  HGB 11.9*   < > 10.5*  --  10.3*  --   HCT 37.4  --  32.2*  --  31.0*  --   PLT 192  --  131*  --  138*  --   APTT  --   --  52*  --   --   --   LABPROT  --   --  14.9  --   --   --   INR  --   --  1.2  --   --   --   HEPARINUNFRC  --   --   --  1.61* 1.16* 0.35  CREATININE 1.23*  --  1.27*  --  0.98  --    < > = values in this interval not displayed.    Estimated Creatinine Clearance: 58.8 mL/min (by C-G formula based on SCr of 0.98 mg/dL).   Medical History: Past Medical History:  Diagnosis Date  . Chemotherapy-induced peripheral neuropathy ([place])   . Epistaxis    a. 11/2016 in setting of asa/plavix-->silver nitrate cauterization.  . GI bleed    a. 11/2016 Admission w/ GIB and hypovolemic shock req 3u PRBC's;  b. 11/2016 ECG: gastritis & nonbleeding peptic ulcer; c. 11/2016 Conlonoscopy: rectal and sigmoid colonic ulcers.  . Heart attack ([place])    a. 1998 Cath @ [facility]: reportedly no intervention required.  Marland Kitchen Herceptin-induced cardiomyopathy ([place])    a. In the setting of Herceptin Rx for breast cancer (initiated 12/2014); b. 03/2015 MUGA EF 64%; b. 08/2015 MUGA: EF 51%; c. 10/2015 MUGA: EF 44%; d. 11/2015 Echo: EF 45-50%; e. 01/2016 MUGA: EF 60%; f. 06/2016 MUGA EF 65%; g. 10/2016 MUGA: EF 61%;  h. 12/2016 Echo: EF 55-60%, gr1 DD.  Marland Kitchen Hyperlipidemia   . Hypertension   . Neuropathy   . Possible PAD (peripheral artery disease) ([place])    a. 11/2016 LE cyanosis and weak  pulses-->CTA w/o significant Ao-BiFem dzs. ? distal dzs-->ASA/Plavix initiated by vascular surgery.  Marland Kitchen PSVT (paroxysmal supraventricular tachycardia) ([place])    a. Dx 11/2016.  . Pulmonary embolism ([place])    a. 12/2016 CTA Chest: small nonocclusive PE in inferior segment of the Left lingula, somewhat eccentric filling defect suggesting chronic rather than acute embolic event; b. 04/8501 LE U/S:  No DVT; c. 12/2016 Echo: Nl RV fxn, nl PASP.  Marland Kitchen Recurrent Metastatic breast cancer ([place])    a. Dx 2016: Stage II, ER positive, PR positive, HER-2/neu overexpressing of the left breast-->chemo/radiation; b. 10/2016 CT Abd/pelvis: diffuse liver mets, ill defined sclerotic bone lesions-T12;  c. 10/2016 MRI brain: metastatic lesion along L temporal lobe (19x42m) w/ extensive surrounding edema & 567mmidline shift to right.  . Sepsis ([place]  . Sinus tachycardia     Assessment: [age]ear old female presented with increased weakness and fall. Patient receiving chemotherapy for breast cancer with mets to liver and brain. Ultrasound lower extremities positive for extensive occlusive and likely acute DVT throughout right  lower extremity as well as what appears to be a combination of acute and chronic DVT in the left lower extremity. Pharmacy consulted for heparin drip for VTE treatment.  No PTA anticoagulant of record.  Started with heparin 4000 unit bolus followed by heparin drip at 1100 units/hr  02/03 2214 HL 1.61 (held for 1 hour and reduced rate to 900 u/hr) 02/04 3567 HL 1.16 (held for 1 hour and reduced rate to 750 u/hr) 02/04 1558 HL 0.35, therapeutic  Goal of Therapy:  Heparin level 0.3-0.7 units/ml Monitor platelets by anticoagulation protocol: Yes   Plan:  Currently therapeutic. Will continue infusion rate of 750 units/hr.  Will recheck HL in 6 hours after infusion resumes per protocol.  CBC daily while on Heparin.  Pearla Dubonnet, PharmD Clinical Pharmacist 11/02/2019 4:43 PM

## 2019-11-02 NOTE — Progress Notes (Signed)
At [name] chat a message to NP Rufina Falco for pain management (patient having back spasms) and a diet order. Sent out a second message at [place] still need orders for pain and diet order. Spoke with NP Rufina Falco about patient's pain and diet order. Due to patient's history of dysphagia orders were placed for ST eval, and orders were placed for pain medication.

## 2019-11-02 NOTE — Progress Notes (Unsigned)
Patient pre screened for office appointment, no questions or concerns today. Patient reminded of upcoming appointment time and date. 

## 2019-11-02 NOTE — Consult Note (Addendum)
Sarah Darby, MD [address]  [place]  [place], [place] [address]  Main: [phone]  Fax: [phone] Pager: [phone]   Consultation  Referring Provider:     No ref. provider found Primary Care Physician:  Delsa Grana, PA-C Primary Gastroenterologist:  Dr. Lucilla Lame       Reason for Consultation: Dysphagia  Date of Admission:  11/01/2019 Date of Consultation:  11/02/2019         HPI:   Sarah Horne is a 64 y.o. female with history of metastatic breast cancer, metastasis to the liver, brain and T12, cardiomyopathy, chemotherapy-induced peripheral neuropathy who was admitted yesterday secondary to generalized weakness, right lower extremity pain and swelling, found to have new onset of extensive right lower extremity DVT.  Vascular surgery is on board who did not recommend thrombectomy/tPA because of brain metastases.  Patient had modified barium swallow when she was evaluated by Dr. Rogue Bussing on 10/27/2019 for dysphagia.  Oral thrush was considered and she was started on Diflucan 200 mg for 2 weeks.  She also underwent esophagogram which revealed prominent cervical esophageal web with no evidence of aspiration.  Barium tablet was not given due to the upper esophageal web.  GI is consulted for further evaluation of dysphagia and proximal esophageal web She has known history of gastric ulcers and erosive esophagitis based on upper endoscopy in 2018 Patient reports that for last 2 weeks, she has been having poor p.o. intake due to difficulty swallowing solids and fluids.  She is able to tolerate liquids only.  She originally had an appointment to see Dr. Allen Norris today to evaluate dysphagia but due to her right leg weakness, she came to ER yesterday Patient denies abdominal pain, nausea or vomiting Patient is not on PPI as outpatient Patient is currently on heparin drip for management of DVT  NSAIDs: None  Antiplts/Anticoagulants/Anti thrombotics: Heparin drip  GI  Procedures:  EGD 12/08/2016 - LA Grade B esophagitis. - Non-bleeding gastric ulcers with no stigmata of bleeding. - Gastritis. - One non-bleeding duodenal ulcer with no stigmata of bleeding. - No specimens collected.  Colonoscopy 02/13/2017 - A few 4 to 5 mm polyps in the sigmoid colon, in the transverse colon and in the ascending colon. Treated with argon beam coagulation. - Non-bleeding internal hemorrhoids. - All the polyps were oozing blood.  Past Medical History:  Diagnosis Date  . Chemotherapy-induced peripheral neuropathy ([place])   . Epistaxis    a. 11/2016 in setting of asa/plavix-->silver nitrate cauterization.  . GI bleed    a. 11/2016 Admission w/ GIB and hypovolemic shock req 3u PRBC's;  b. 11/2016 ECG: gastritis & nonbleeding peptic ulcer; c. 11/2016 Conlonoscopy: rectal and sigmoid colonic ulcers.  . Heart attack ([place])    a. 1998 Cath @ [facility]: reportedly no intervention required.  Marland Kitchen Herceptin-induced cardiomyopathy ([place])    a. In the setting of Herceptin Rx for breast cancer (initiated 12/2014); b. 03/2015 MUGA EF 64%; b. 08/2015 MUGA: EF 51%; c. 10/2015 MUGA: EF 44%; d. 11/2015 Echo: EF 45-50%; e. 01/2016 MUGA: EF 60%; f. 06/2016 MUGA EF 65%; g. 10/2016 MUGA: EF 61%;  h. 12/2016 Echo: EF 55-60%, gr1 DD.  Marland Kitchen Hyperlipidemia   . Hypertension   . Neuropathy   . Possible PAD (peripheral artery disease) ([place])    a. 11/2016 LE cyanosis and weak pulses-->CTA w/o significant Ao-BiFem dzs. ? distal dzs-->ASA/Plavix initiated by vascular surgery.  Marland Kitchen PSVT (paroxysmal supraventricular tachycardia) ([place])    a. Dx  11/2016.  . Pulmonary embolism ([place])    a. 12/2016 CTA Chest: small nonocclusive PE in inferior segment of the Left lingula, somewhat eccentric filling defect suggesting chronic rather than acute embolic event; b. 04/5884 LE U/S:  No DVT; c. 12/2016 Echo: Nl RV fxn, nl PASP.  Marland Kitchen Recurrent Metastatic breast cancer ([place])    a. Dx 2016: Stage II, ER positive, PR positive, HER-2/neu overexpressing  of the left breast-->chemo/radiation; b. 10/2016 CT Abd/pelvis: diffuse liver mets, ill defined sclerotic bone lesions-T12;  c. 10/2016 MRI brain: metastatic lesion along L temporal lobe (19x29m) w/ extensive surrounding edema & 558mmidline shift to right.  . Sepsis ([place]  . Sinus tachycardia     Past Surgical History:  Procedure Laterality Date  . BREAST BIOPSY Left 2016   Positive  . BREAST LUMPECTOMY WITH SENTINEL LYMPH NODE BIOPSY Left 05/23/2015   Procedure: LEFT BREAST WIDE EXCISION WITH AXILLARY DISSECTION, MASTOPLASTY ;  Surgeon: JeRobert BellowMD;  Location: [facility]S;  Service: General;  Laterality: Left;  . BREAST SURGERY Left 12/18/14   breast biopsy/INVASIVE DUCTAL CARCINOMA OF BREAST, NOTTINGHAM GRADE 2.  . Marland KitchenREAST SURGERY  05/23/2015.   Wide excision/mastoplasty, axillary dissection. No residual invasive cancer, positive for residual DCIS. 0/2 nodes identified on axillary dissection. (no SLN by technetium or methylene blue)  . CARDIAC CATHETERIZATION    . COLONOSCOPY WITH PROPOFOL N/A 12/10/2016   Procedure: COLONOSCOPY WITH PROPOFOL;  Surgeon: DaLucilla LameMD;  Location: [facility] ENDOSCOPY;  Service: Endoscopy;  Laterality: N/A;  . COLONOSCOPY WITH PROPOFOL N/A 02/13/2017   Procedure: COLONOSCOPY WITH PROPOFOL;  Surgeon: WoLucilla LameMD;  [facility];  Service: Endoscopy;  Laterality: N/A;  . ESOPHAGOGASTRODUODENOSCOPY (EGD) WITH PROPOFOL N/A 12/08/2016   Procedure: ESOPHAGOGASTRODUODENOSCOPY (EGD) WITH PROPOFOL;  Surgeon: DaLucilla LameMD;  Location: [facility] END[facility];  Service: Endoscopy;  Laterality: N/A;  . IVC FILTER INSERTION N/A 02/15/2017   Procedure: IVC Filter Insertion;  Surgeon: DeAlgernon HuxleyMD;  [facility];  Service: Cardiovascular;  Laterality: N/A;  . PORTACATH PLACEMENT Right 12-31-14   Dr ByBary Castilla  Prior to Admission medications   Medication Sig Start Date End Date Taking? Authorizing Provider  flecainide (TAMBOCOR) 50 MG tablet  Take 1 tablet (50 mg total) by mouth 2 (two) times daily. 09/04/19  Yes BeTheora GianottiNP  ipratropium (ATROVENT) 0.03 % nasal spray Place 2 sprays into both nostrils every 12 (twelve) hours. 09/13/19  Yes BrCammie SickleMD  levocetirizine (XYZAL) 5 MG tablet Take 1 tablet (5 mg total) by mouth every evening. 10/26/19  Yes TaDelsa GranaPA-C  magnesium oxide (MAG-OX) 400 MG tablet Take 1 tablet (400 mg total) by mouth 2 (two) times daily. 08/09/19  Yes ArWellington HampshireMD  meclizine (ANTIVERT) 25 MG tablet Take 1 tablet (25 mg total) by mouth 3 (three) times daily as needed for dizziness. 08/30/19  Yes BuJacquelin HawkingNP  metoprolol tartrate (LOPRESSOR) 25 MG tablet Take 1 tablet (25 mg total) by mouth 2 (two) times daily. 10/13/19  Yes BrCammie SickleMD  montelukast (SINGULAIR) 10 MG tablet Take 1 tablet (10 mg total) by mouth at bedtime. 09/08/19  Yes BrCammie SickleMD  pregabalin (LYRICA) 100 MG capsule Take 1 capsule (100 mg total) by mouth 2 (two) times daily. 10/26/19  Yes TaDelsa GranaPA-C  Vitamin D, Ergocalciferol, (DRISDOL) 1.25 MG (50000 UNIT) CAPS capsule TAKE 1 CAPSULE (50,000 UNITS TOTAL) ONCE A WEEK BY MOUTH.  Patient taking differently: Take 50,000 Units by mouth every 7 (seven) days.  10/16/19  Yes Cammie Sickle, MD  fluconazole (DIFLUCAN) 200 MG tablet Take 1 tablet (200 mg total) by mouth daily. 10/27/19   Cammie Sickle, MD    Current Facility-Administered Medications:  .  [START ON 11/03/2019] 0.9 %  sodium chloride infusion, , Intravenous, Continuous, Stegmayer, Kimberly A, PA-C .  acetaminophen (TYLENOL) tablet 650 mg, 650 mg, Oral, Q6H PRN, Jennye Boroughs, MD .  cetirizine (ZYRTEC) tablet 10 mg, 10 mg, Oral, Daily, Jennye Boroughs, MD, 10 mg at 11/02/19 1119 .  flecainide (TAMBOCOR) tablet 50 mg, 50 mg, Oral, BID, Jennye Boroughs, MD, 50 mg at 11/02/19 1119 .  fluconazole (DIFLUCAN) tablet 200 mg, 200 mg, Oral, Daily, Jennye Boroughs, MD, 200 mg at 11/02/19 1119 .  heparin ADULT infusion 100 units/mL (25000 units/28m sodium chloride 0.45%), 750 Units/hr, Intravenous, Continuous, Shanlever, CPierce Crane RPH, Last Rate: 7.5 mL/hr at 11/02/19 1025, 750 Units/hr at 11/02/19 1025 .  ipratropium (ATROVENT) 0.03 % nasal spray 2 spray, 2 spray, Each Nare, BID, AJennye Boroughs MD, 2 spray at 11/02/19 1120 .  magnesium oxide (MAG-OX) tablet 400 mg, 400 mg, Oral, BID, AJennye Boroughs MD, 400 mg at 11/02/19 1120 .  meclizine (ANTIVERT) tablet 25 mg, 25 mg, Oral, TID PRN, AJennye Boroughs MD .  metoprolol tartrate (LOPRESSOR) tablet 25 mg, 25 mg, Oral, BID, AJennye Boroughs MD, 25 mg at 11/02/19 1119 .  montelukast (SINGULAIR) tablet 10 mg, 10 mg, Oral, QHS, AJennye Boroughs MD, 10 mg at 11/01/19 2153 .  morphine 4 MG/ML injection 4 mg, 4 mg, Intravenous, Q6H PRN, AJennye Boroughs MD .  ondansetron (ZOFRAN) injection 4 mg, 4 mg, Intravenous, Q6H PRN, AJennye Boroughs MD .  oxyCODONE (Oxy IR/ROXICODONE) immediate release tablet 5 mg, 5 mg, Oral, Q4H PRN, OLang Snow NP, 5 mg at 11/02/19 0406 .  pregabalin (LYRICA) capsule 100 mg, 100 mg, Oral, BID, AJennye Boroughs MD, 100 mg at 11/02/19 1119  Facility-Administered Medications Ordered in Other Encounters:  .  0.9 %  sodium chloride infusion, , Intravenous, Continuous, PLeia Alf MD, New Bag at 12/10/16 1140 .  0.9 %  sodium chloride infusion, , Intravenous, Continuous, FLloyd Huger MD, Last Rate: 999 mL/hr at 04/24/15 1520, New Bag at 05/23/15 0951 .  0.9 %  sodium chloride infusion, , Intravenous, Continuous, AVerlon Au NP, Last Rate: 999 mL/hr at 10/31/19 1522, New Bag at 10/31/19 1522 .  heparin lock flush 100 unit/mL, 500 Units, Intravenous, Once, Berenzon, Dmitriy, MD .  sodium chloride 0.9 % injection 10 mL, 10 mL, Intravenous, PRN, PMa Hillock Sandeep, MD, 10 mL at 04/04/15 1440 .  sodium chloride flush (NS) 0.9 % injection 10 mL, 10 mL, Intravenous,  PRN, Berenzon, Dmitriy, MD   Family History  Problem Relation Age of Onset  . Breast cancer Maternal Aunt   . Breast cancer Cousin   . Brain cancer Maternal Uncle   . Diabetes Mother   . Hypertension Mother   . Stroke Mother      Social History   Tobacco Use  . Smoking status: Light Tobacco Smoker    Packs/day: 0.50    Years: 18.00    Pack years: 9.00    Types: Cigarettes  . Smokeless tobacco: Never Used  Substance Use Topics  . Alcohol use: No    Alcohol/week: 0.0 standard drinks  . Drug use: No    Allergies as of 11/01/2019 - Review Complete 11/01/2019  Allergen Reaction Noted  . No known allergies  01/17/2015    Review of Systems:    All systems reviewed and negative except where noted in HPI.   Physical Exam:  Vital signs in last 24 hours: Temp:  [97.8 F (36.6 C)-98.6 F (37 C)] 98.6 F (37 C) (02/04 1633) Pulse Rate:  [101-110] 101 (02/04 1633) Resp:  [16-18] 18 (02/04 1633) BP: (117-129)/(61-83) 121/76 (02/04 1633) SpO2:  [97 %-100 %] 97 % (02/04 1633) Weight:  [73.7 kg] 73.7 kg (02/03 1820)   General: Ill-appearing, not in distress, lethargic Head:  Normocephalic and atraumatic. Eyes:   No icterus.   Conjunctiva pink. PERRLA. Ears:  Normal auditory acuity. Neck:  Supple; no masses or thyroidomegaly Lungs: Respirations even and unlabored. Lungs clear to auscultation bilaterally.   No wheezes, crackles, or rhonchi.  Heart:  Regular rate and rhythm;  Without murmur, clicks, rubs or gallops Abdomen:  Soft, nondistended, nontender. Normal bowel sounds. No appreciable masses or hepatomegaly.  No rebound or guarding.  Rectal:  Not performed. Msk:  Symmetrical without gross deformities.  Generalized weakness Extremities: 2+ edema, no cyanosis or clubbing. Neurologic:  Alert and oriented x3;  grossly normal neurologically. Skin:  Intact without significant lesions or rashes. Cervical Nodes:  No significant cervical adenopathy. Psych:  Alert and  cooperative. Normal affect.  LAB RESULTS: CBC Latest Ref Rng & Units 11/02/2019 11/01/2019 10/31/2019  WBC 4.0 - 10.5 K/uL 2.2(L) 2.8(L) 3.1(L)  Hemoglobin 12.0 - 15.0 g/dL 10.3(L) 10.5(L) 11.9(L)  Hematocrit 36.0 - 46.0 % 31.0(L) 32.2(L) 37.4  Platelets 150 - 400 K/uL 138(L) 131(L) 192    BMET BMP Latest Ref Rng & Units 11/02/2019 11/01/2019 10/31/2019  Glucose 70 - 99 mg/dL 90 118(H) 119(H)  BUN 8 - 23 mg/dL 28(H) 31(H) 27(H)  Creatinine 0.44 - 1.00 mg/dL 0.98 1.27(H) 1.23(H)  BUN/Creat Ratio 12 - 28 - - -  Sodium 135 - 145 mmol/L 136 132(L) 133(L)  Potassium 3.5 - 5.1 mmol/L 4.5 4.8 4.6  Chloride 98 - 111 mmol/L 101 97(L) 97(L)  CO2 22 - 32 mmol/L 25 25 22   Calcium 8.9 - 10.3 mg/dL 8.9 9.1 9.1    LFT Hepatic Function Latest Ref Rng & Units 11/02/2019 11/01/2019 10/31/2019  Total Protein 6.5 - 8.1 g/dL 7.1 7.0 7.5  Albumin 3.5 - 5.0 g/dL 3.8 3.9 3.8  AST 15 - 41 U/L 44(H) 43(H) 43(H)  ALT 0 - 44 U/L 46(H) 53(H) 61(H)  Alk Phosphatase 38 - 126 U/L 193(H) 230(H) 261(H)  Total Bilirubin 0.3 - 1.2 mg/dL 1.8(H) 2.2(H) 2.2(H)  Bilirubin, Direct 0.1 - 0.5 mg/dL - - -     STUDIES: DG Abd 1 View  Result Date: 11/02/2019 CLINICAL DATA:  IVC filter present. EXAM: ABDOMEN - 1 VIEW COMPARISON:  Abdominal x-ray dated Feb 20, 2018. FINDINGS: The bowel gas pattern is normal. Oral contrast in the colon. No radio-opaque calculi or other significant radiographic abnormality are seen. Unchanged IVC filter. Calcified fibroids in the pelvis again noted. Unchanged nodular contour of the inferior right liver due to known hepatic metastatic disease. No acute osseous abnormality. IMPRESSION: No acute findings. Electronically Signed   By: Titus Dubin M.D.   On: 11/02/2019 15:37   MR BRAIN W WO CONTRAST  Result Date: 11/02/2019 CLINICAL DATA:  Metastatic breast cancer, follow-up EXAM: MRI HEAD WITHOUT AND WITH CONTRAST TECHNIQUE: Multiplanar, multiecho pulse sequences of the brain and surrounding structures were  obtained without and with intravenous contrast. CONTRAST:  29m GADAVIST GADOBUTROL  1 MMOL/ML IV SOLN COMPARISON:  08/31/2019 FINDINGS: There is motion artifact particularly on postcontrast imaging. Brain: There is suboptimal comparison with the prior study due to artifact. Enhancing lesions do not appear substantially changed. Several of the lesions are not identified, but this is probably secondary to small size in the setting of artifact. There is no definite new lesion. No new edema. There is no acute infarction or intracranial hemorrhage. Confluent areas of T2 hyperintensity in the supratentorial white matter likely reflects therapy related change. There is no hydrocephalus. Vascular: Major vessel flow voids at the skull base are preserved. Skull and upper cervical spine: Normal marrow signal is preserved. Sinuses/Orbits: Paranasal sinuses are aerated. Orbits are unremarkable. Other: Sella is unremarkable.  Mastoid air cells are clear. IMPRESSION: Suboptimal evaluation and comparison due to motion artifact. However, there is no evidence of new or progressive disease. Electronically Signed   By: Macy Mis M.D.   On: 11/02/2019 12:57   US Venous Img Lower Bilateral  Result Date: 11/01/2019 CLINICAL DATA:  64 year old female with leg pain EXAM: BILATERAL LOWER EXTREMITY VENOUS DOPPLER ULTRASOUND TECHNIQUE: Gray-scale sonography with graded compression, as well as color Doppler and duplex ultrasound were performed to evaluate the lower extremity deep venous systems from the level of the common femoral vein and including the common femoral, femoral, profunda femoral, popliteal and calf veins including the posterior tibial, peroneal and gastrocnemius veins when visible. The superficial great saphenous vein was also interrogated. Spectral Doppler was utilized to evaluate flow at rest and with distal augmentation maneuvers in the common femoral, femoral and popliteal veins. COMPARISON:  None. FINDINGS: RIGHT  LOWER EXTREMITY Common Femoral Vein: Noncompressible. The lumen is expanded and filled with heterogeneous internal echoes. No evidence of flow on color Doppler imaging. Findings are consistent with occlusive thrombus. Saphenofemoral Junction: Occlusive thrombus extends into the proximal saphenofemoral junction. Profunda Femoral Vein: Occlusive thrombus extends into the profunda femoral vein. Femoral Vein: Occlusive thrombus extends throughout the femoral vein in the thigh. Popliteal Vein: Occlusive thrombus extends through the popliteal vein. Calf Veins: Occlusive thrombus extends into the posterior tibial, peroneal and gastrocnemius veins. Superficial Great Saphenous Vein: Positive thrombus in the small saphenous vein. The great saphenous vein is patent and compressible. Venous Reflux:  None. Other Findings:  None. LEFT LOWER EXTREMITY Common Femoral Vein: Partially compressible. There is some eccentric wall thickening in the left common vein consistent with chronic DVT. Saphenofemoral Junction: Eccentric wall thickening extends into the saphenofemoral junction. Profunda Femoral Vein: Wall thickening extends into the profunda femoral vein. Femoral Vein: Wall thickening extends into the femoral vein. Popliteal Vein: The popliteal vein is only partially compressible. The lumen is expanded and filled with heterogeneous echoes. There is a segment of the vein without evidence of vascular flow on color Doppler consistent with occlusive DVT. Calf Veins: No evidence of thrombus. Normal compressibility and flow on color Doppler imaging. Superficial Great Saphenous Vein: Noncompressible. Echogenic material noted throughout the great saphenous vein. Venous Reflux:  None. Other Findings:  None. IMPRESSION: 1. Positive for extensive occlusive and likely acute DVT throughout the right lower extremity. 2. Positive for what appears to be a combination of acute and chronic DVT in the left lower extremity. There is a relatively  short segment of occlusive and likely acute DVT in the popliteal vein with findings more consistent with chronic, or nonocclusive DVT in the remaining venous structures. Electronically Signed   By: Jacqulynn Cadet M.D.   On: 11/01/2019 14:36  Impression / Plan:   Sarah Horne is a 64 y.o. female with history of metastatic breast cancer, new onset of right lower extremity extensive DVT, s/p IVC filter, currently on heparin drip, GI is consulted for dysphagia.  Recent esophagram revealed cervical esophageal web.  Patient is also being treated for oral thrush Patient has mild pancytopenia  Dysphagia: Known history of erosive esophagitis based on EGD in 2018 and recent barium esophagogram revealed prominent cervical esophageal web Patient has IVC filter confirmed on x-ray abdomen today In order to proceed with upper endoscopy, heparin needs to be held at least for 4 hours prior to the procedure She can stay on liquid diet N.p.o. past midnight Continue Diflucan for possible thrush/candidiasis  Thank you for involving me in the care of this patient.    LOS: 1 day   Sherri Sear, MD  11/02/2019, 4:56 PM   Note: This dictation was prepared with Dragon dictation along with smaller phrase technology. Any transcriptional errors that result from this process are unintentional.

## 2019-11-02 NOTE — Plan of Care (Signed)
  Problem: Education: Goal: Knowledge of General Education information will improve Description: Including pain rating scale, medication(s)/side effects and non-pharmacologic comfort measures Outcome: Progressing   Problem: Consults Goal: Venous Thromboembolism Patient Education Description: See Patient Education Module for education specifics. Outcome: Progressing Goal: Diagnosis - Venous Thromboembolism (VTE) Description: Choose a selection Outcome: Progressing

## 2019-11-02 NOTE — Consult Note (Signed)
Sarah Horne Consult Note  MRN : [id number]  Sarah Horne is a 64 y.o. (1956-07-23) female who presents with chief complaint of  Chief Complaint  Patient presents with  . Fatigue  . Fall   History of Present Illness:  The patient is a 64 year old female with multiple medical issues (see below) including past medical history of DVT/PE s/p IVC filter placement in 2018 and recurrent metastatic breast cancer to the bone liver and brain who arrived to the [facility] emergency department via EMS due to increased weakness.  Patient endorses a history of ongoing palliative chemotherapy for metastatic breast cancer.  EMS was called to the patient's house due to progressively worsening weakness and the patient falling out of bed and not being able to get up.  Patient is experiencing some difficulty swallowing however denies any fever, nausea vomiting.  Denies any chest pain or shortness of breath.  During work-up in the emergency department, the patient was noted to have swelling and discomfort to her right lower extremity.  Ultrasound was notable for:  1. Positive for extensive occlusive and likely acute DVT throughout the right lower extremity. 2. Positive for what appears to be a combination of acute and chronic DVT in the left lower extremity. There is a relatively short segment of occlusive and likely acute DVT in the popliteal vein with findings more consistent with chronic, or nonocclusive DVT in the remaining venous structures.  Horne surgery was consulted by Dr. Charna Archer for possible thrombectomy/thrombolysis.  Current Facility-Administered Medications  Medication Dose Route Frequency Provider Last Rate Last Admin  . [START ON 11/03/2019] 0.9 %  sodium chloride infusion   Intravenous Continuous Antonisha Waskey A, PA-C      . acetaminophen (TYLENOL) tablet 650 mg  650 mg Oral Q6H PRN Jennye Boroughs, MD      . cetirizine (ZYRTEC)  tablet 10 mg  10 mg Oral Daily Jennye Boroughs, MD   10 mg at 11/02/19 1119  . flecainide (TAMBOCOR) tablet 50 mg  50 mg Oral BID Jennye Boroughs, MD   50 mg at 11/02/19 1119  . fluconazole (DIFLUCAN) tablet 200 mg  200 mg Oral Daily Jennye Boroughs, MD   200 mg at 11/02/19 1119  . heparin ADULT infusion 100 units/mL (25000 units/253m sodium chloride 0.45%)  750 Units/hr Intravenous Continuous SLu Duffel RPH 7.5 mL/hr at 11/02/19 1025 750 Units/hr at 11/02/19 1025  . ipratropium (ATROVENT) 0.03 % nasal spray 2 spray  2 spray Each Nare BID AJennye Boroughs MD   2 spray at 11/02/19 1120  . magnesium oxide (MAG-OX) tablet 400 mg  400 mg Oral BID AJennye Boroughs MD   400 mg at 11/02/19 1120  . meclizine (ANTIVERT) tablet 25 mg  25 mg Oral TID PRN AJennye Boroughs MD      . metoprolol tartrate (LOPRESSOR) tablet 25 mg  25 mg Oral BID AJennye Boroughs MD   25 mg at 11/02/19 1119  . montelukast (SINGULAIR) tablet 10 mg  10 mg Oral QHS AJennye Boroughs MD   10 mg at 11/01/19 2153  . morphine 4 MG/ML injection 4 mg  4 mg Intravenous Q6H PRN AJennye Boroughs MD      . ondansetron ([facility] injection 4 mg  4 mg Intravenous Q6H PRN AJennye Boroughs MD      . oxyCODONE (Oxy IR/ROXICODONE) immediate release tablet 5 mg  5 mg Oral Q4H PRN OLang Snow NP   5 mg at 11/02/19 0406  .  pregabalin (LYRICA) capsule 100 mg  100 mg Oral BID Jennye Boroughs, MD   100 mg at 11/02/19 1119   Facility-Administered Medications Ordered in Other Encounters  Medication Dose Route Frequency Provider Last Rate Last Admin  . 0.9 %  sodium chloride infusion   Intravenous Continuous [name], MD   New Bag at 12/10/16 1140  . 0.9 %  sodium chloride infusion   Intravenous Continuous Lloyd Huger, MD 999 mL/hr at 04/24/15 1520 New Bag at 05/23/15 0951  . 0.9 %  sodium chloride infusion   Intravenous Continuous Verlon Au, NP 999 mL/hr at 10/31/19 1522 New Bag at 10/31/19 1522  . heparin lock flush 100  unit/mL  500 Units Intravenous Once Berenzon, Dmitriy, MD      . sodium chloride 0.9 % injection 10 mL  10 mL Intravenous PRN [name], MD   10 mL at 04/04/15 1440  . sodium chloride flush (NS) 0.9 % injection 10 mL  10 mL Intravenous PRN Roxana Hires, MD       Past Medical History:  Diagnosis Date  . Chemotherapy-induced peripheral neuropathy ([place])   . Epistaxis    a. 11/2016 in setting of asa/plavix-->silver nitrate cauterization.  . GI bleed    a. 11/2016 Admission w/ GIB and hypovolemic shock req 3u PRBC's;  b. 11/2016 ECG: gastritis & nonbleeding peptic ulcer; c. 11/2016 Conlonoscopy: rectal and sigmoid colonic ulcers.  . Heart attack ([place])    a. 1998 Cath @ [facility]: reportedly no intervention required.  Marland Kitchen Herceptin-induced cardiomyopathy ([place])    a. In the setting of Herceptin Rx for breast cancer (initiated 12/2014); b. 03/2015 MUGA EF 64%; b. 08/2015 MUGA: EF 51%; c. 10/2015 MUGA: EF 44%; d. 11/2015 Echo: EF 45-50%; e. 01/2016 MUGA: EF 60%; f. 06/2016 MUGA EF 65%; g. 10/2016 MUGA: EF 61%;  h. 12/2016 Echo: EF 55-60%, gr1 DD.  Marland Kitchen Hyperlipidemia   . Hypertension   . Neuropathy   . Possible PAD (peripheral artery disease) ([place])    a. 11/2016 LE cyanosis and weak pulses-->CTA w/o significant Ao-BiFem dzs. ? distal dzs-->ASA/Plavix initiated by Horne surgery.  Marland Kitchen PSVT (paroxysmal supraventricular tachycardia) ([place])    a. Dx 11/2016.  . Pulmonary embolism ([place])    a. 12/2016 CTA Chest: small nonocclusive PE in inferior segment of the Left lingula, somewhat eccentric filling defect suggesting chronic rather than acute embolic event; b. 0/2409 LE U/S:  No DVT; c. 12/2016 Echo: Nl RV fxn, nl PASP.  Marland Kitchen Recurrent Metastatic breast cancer ([place])    a. Dx 2016: Stage II, ER positive, PR positive, HER-2/neu overexpressing of the left breast-->chemo/radiation; b. 10/2016 CT Abd/pelvis: diffuse liver mets, ill defined sclerotic bone lesions-T12;  c. 10/2016 MRI brain: metastatic lesion along L temporal lobe  (19x70m) w/ extensive surrounding edema & 539mmidline shift to right.  . Sepsis ([place]  . Sinus tachycardia    Past Surgical History:  Procedure Laterality Date  . BREAST BIOPSY Left 2016   Positive  . BREAST LUMPECTOMY WITH SENTINEL LYMPH NODE BIOPSY Left 05/23/2015   Procedure: LEFT BREAST WIDE EXCISION WITH AXILLARY DISSECTION, MASTOPLASTY ;  Surgeon: JeRobert BellowMD;  Location: [facility] ORS;  Service: General;  Laterality: Left;  . BREAST SURGERY Left 12/18/14   breast biopsy/INVASIVE DUCTAL CARCINOMA OF BREAST, NOTTINGHAM GRADE 2.  . Marland KitchenREAST SURGERY  05/23/2015.   Wide excision/mastoplasty, axillary dissection. No residual invasive cancer, positive for residual DCIS. 0/2 nodes identified on axillary dissection. (no SLN by technetium or  methylene blue)  . CARDIAC CATHETERIZATION    . COLONOSCOPY WITH PROPOFOL N/A 12/10/2016   Procedure: COLONOSCOPY WITH PROPOFOL;  Surgeon: Lucilla Lame, MD;  Location: [facility] ENDOSCOPY;  Service: Endoscopy;  Laterality: N/A;  . COLONOSCOPY WITH PROPOFOL N/A 02/13/2017   Procedure: COLONOSCOPY WITH PROPOFOL;  Surgeon: Lucilla Lame, MD;  Location: [facility] ENDOSCOPY;  Service: Endoscopy;  Laterality: N/A;  . ESOPHAGOGASTRODUODENOSCOPY (EGD) WITH PROPOFOL N/A 12/08/2016   Procedure: ESOPHAGOGASTRODUODENOSCOPY (EGD) WITH PROPOFOL;  Surgeon: Lucilla Lame, MD;  Location: [facility] ENDOSCOPY;  Service: Endoscopy;  Laterality: N/A;  . IVC FILTER INSERTION N/A 02/15/2017   Procedure: IVC Filter Insertion;  Surgeon: Algernon Huxley, MD;  Location: [place] CV LAB;  Service: Cardiovascular;  Laterality: N/A;  . PORTACATH PLACEMENT Right 12-31-14   Dr Bary Castilla   Social History Social History   Tobacco Use  . Smoking status: Light Tobacco Smoker    Packs/day: 0.50    Years: 18.00    Pack years: 9.00    Types: Cigarettes  . Smokeless tobacco: Never Used  Substance Use Topics  . Alcohol use: No    Alcohol/week: 0.0 standard drinks  . Drug use: No   Family History Family  History  Problem Relation Age of Onset  . Breast cancer Maternal Aunt   . Breast cancer Cousin   . Brain cancer Maternal Uncle   . Diabetes Mother   . Hypertension Mother   . Stroke Mother   Denies family history of peripheral artery disease, venous disease or bleeding/clotting disorders.  Allergies  Allergen Reactions  . No Known Allergies    REVIEW OF SYSTEMS (Negative unless checked)  Constitutional: [] Weight loss  [] Fever  [] Chills Cardiac: [] Chest pain   [] Chest pressure   [] Palpitations   [] Shortness of breath when laying flat   [] Shortness of breath at rest   [] Shortness of breath with exertion. Horne:  [x] Pain in legs with walking   [x] Pain in legs at rest   [x] Pain in legs when laying flat   [] Claudication   [] Pain in feet when walking  [] Pain in feet at rest  [] Pain in feet when laying flat   [x] History of DVT   [] Phlebitis   [x] Swelling in legs   [] Varicose veins   [] Non-healing ulcers Pulmonary:   [] Uses home oxygen   [] Productive cough   [] Hemoptysis   [] Wheeze  [] COPD   [] Asthma Neurologic:  [] Dizziness  [] Blackouts   [] Seizures   [] History of stroke   [] History of TIA  [] Aphasia   [] Temporary blindness   [] Dysphagia   [] Weakness or numbness in arms   [] Weakness or numbness in legs Musculoskeletal:  [] Arthritis   [] Joint swelling   [] Joint pain   [] Low back pain Hematologic:  [] Easy bruising  [] Easy bleeding   [] Hypercoagulable state   [] Anemic  [] Hepatitis Gastrointestinal:  [] Blood in stool   [] Vomiting blood  [] Gastroesophageal reflux/heartburn   [] Difficulty swallowing. Genitourinary:  [] Chronic kidney disease   [] Difficult urination  [] Frequent urination  [] Burning with urination   [] Blood in urine Skin:  [] Rashes   [] Ulcers   [] Wounds Psychological:  [] History of anxiety   []  History of major depression.  Physical Examination  Vitals:   11/01/19 1820 11/01/19 2152 11/01/19 2317 11/02/19 0749  BP: 129/61 117/83 120/83 120/79  Pulse: (!) 110 (!) 104 (!) 106 (!)  103  Resp: 17  16 17   Temp: 97.8 F (36.6 C)  98.4 F (36.9 C) 98.5 F (36.9 C)  TempSrc: Oral  Oral Oral  SpO2: 100%  100% 98%  Weight: 73.7 kg     Height: 5' 6"  (1.676 m)      Body mass index is 26.22 kg/m. Gen:  WD/WN, NAD Head: [place]/AT, No temporalis wasting. Prominent temp pulse not noted. Ear/Nose/Throat: Hearing grossly intact, nares w/o erythema or drainage, oropharynx w/o Erythema/Exudate Eyes: Sclera non-icteric, conjunctiva clear Neck: Trachea midline.  No JVD.  Pulmonary:  Good air movement, respirations not labored, equal bilaterally.  Cardiac: RRR, normal S1, S2. Horne:  Vessel Right Left  Radial Palpable Palpable  Ulnar Palpable Palpable  Brachial Palpable Palpable  Carotid Palpable, without bruit Palpable, without bruit  Aorta Not palpable N/A  Femoral Palpable Palpable  Popliteal Palpable Palpable  PT Palpable Palpable  DP Palpable Palpable   Right lower extremity: Thigh soft.  Calf soft.  Extremities warm distally toes.  Moderate edema.  There is no acute Horne compromise to the leg at this time.  Motor/sensory is intact.  Nontender to palpation.  Left lower extremity: Thigh soft.  Calf soft.  Extremities warm distally toes.  Mild edema.  There is no acute Horne compromise to the leg at this time.  Motor/sensory is intact.  Nontender to palpation.  Gastrointestinal: soft, non-tender/non-distended. No guarding/reflex.  Musculoskeletal: M/S 5/5 throughout.  Extremities without ischemic changes.  No deformity or atrophy. No edema. Neurologic: Sensation grossly intact in extremities.  Symmetrical.  Speech is fluent. Motor exam as listed above. Psychiatric: Judgment intact, Mood & affect appropriate for pt's clinical situation. Dermatologic: No rashes or ulcers noted.  No cellulitis or open wounds. Lymph : No Cervical, Axillary, or Inguinal lymphadenopathy.  CBC Lab Results  Component Value Date   WBC 2.2 (L) 11/02/2019   HGB 10.3 (L) 11/02/2019    HCT 31.0 (L) 11/02/2019   MCV 98.1 11/02/2019   PLT 138 (L) 11/02/2019   BMET    Component Value Date/Time   NA 136 11/02/2019 0623   NA 135 03/04/2017 0918   NA 135 01/08/2015 0850   K 4.5 11/02/2019 0623   K 3.7 01/08/2015 0850   CL 101 11/02/2019 0623   CL 101 01/08/2015 0850   CO2 25 11/02/2019 0623   CO2 26 01/08/2015 0850   GLUCOSE 90 11/02/2019 0623   GLUCOSE 161 (H) 01/08/2015 0850   BUN 28 (H) 11/02/2019 0623   BUN 8 03/04/2017 0918   BUN 17 01/08/2015 0850   CREATININE 0.98 11/02/2019 0623   CREATININE 0.82 01/22/2015 1559   CALCIUM 8.9 11/02/2019 0623   CALCIUM 9.3 01/08/2015 0850   GFRNONAA >60 11/02/2019 0623   GFRNONAA >60 01/22/2015 1559   GFRAA >60 11/02/2019 0623   GFRAA >60 01/22/2015 1559   Estimated Creatinine Clearance: 58.8 mL/min (by C-G formula based on SCr of 0.98 mg/dL).  COAG Lab Results  Component Value Date   INR 1.2 11/01/2019   INR 1.1 10/05/2019   INR 1.11 03/16/2017   Radiology DG Chest 1 View  Result Date: 10/05/2019 CLINICAL DATA:  Assess if port is power injectable EXAM: CHEST  1 VIEW COMPARISON:  Radiograph 10/04/2019, CT Feb 03, 2019, radiographs 06/04/2017, 01/12/2017 FINDINGS: There is a right subclavian approach Port-A-Cath with the tip positioned at the superior cavoatrial junction. This port is currently accessed by [facility] needle. Notably, the typical labeling of the port to denote whether or not this device is power injectable is not well visualized on this exam though was present on CT images from Feb 03, 2019 and more remote radiographic comparison from 01/12/2017. Overall configuration of the  device itself including positioning of the port pocket, catheter curvature and tip position appear grossly unchanged. No consolidation, features of edema, pneumothorax, or effusion. The cardiomediastinal contours are unremarkable. No acute osseous or soft tissue abnormality. IMPRESSION: 1. Port-A-Cath with the tip positioned at the superior  cavoatrial junction. This port port is currently accessed by [employer] needle. 2. Port labeling to denote whether not this devices power injectable is not well visualized on radiography but was present on comparison CT 02/03/2019 and radiograph 01/12/2017. Device appears grossly unchanged though should correlate in the patient's chart if there has been intervention upon the port since May of 2020. 3. No other acute cardiopulmonary findings. These results were called by telephone at the time of interpretation on 10/05/2019 at 6:15 am to provider [facility] , who verbally acknowledged these results. Electronically Signed   By: Lovena Le M.D.   On: 10/05/2019 06:15   CT Head Wo Contrast  Result Date: 10/05/2019 CLINICAL DATA:  Fall EXAM: CT HEAD WITHOUT CONTRAST CT CERVICAL SPINE WITHOUT CONTRAST TECHNIQUE: Multidetector CT imaging of the head and cervical spine was performed following the standard protocol without intravenous contrast. Multiplanar CT image reconstructions of the cervical spine were also generated. COMPARISON:  None. FINDINGS: CT HEAD FINDINGS Brain: There is no mass, hemorrhage or extra-axial collection. The size and configuration of the ventricles and extra-axial CSF spaces are normal. There is diffuse severe white matter hypoattenuation, possibly a sequela of radiation therapy. Horne: No abnormal hyperdensity of the major intracranial arteries or dural venous sinuses. No intracranial atherosclerosis. Skull: The visualized skull base, calvarium and extracranial soft tissues are normal. Sinuses/Orbits: No fluid levels or advanced mucosal thickening of the visualized paranasal sinuses. No mastoid or middle ear effusion. The orbits are normal. CT CERVICAL SPINE FINDINGS Alignment: No static subluxation. Facets are aligned. Occipital condyles are normally positioned. Skull base and vertebrae: No acute fracture. Soft tissues and spinal canal: No prevertebral fluid or swelling. No visible canal  hematoma. Disc levels: Multilevel facet arthrosis. There is fusion of the right C2 and C3 facets. Upper chest: Large cavitary lesion of the left lung apex, measuring up to 3.5 cm. Other: Normal visualized paraspinal cervical soft tissues. IMPRESSION: 1. Severe chronic white matter disease, likely post radiation changes. No acute intracranial abnormality. 2. No acute fracture or static subluxation of the cervical spine. 3. Large cavitary lesion of the left lung apex, measuring up to 3.5 cm, likely a metastasis. Electronically Signed   By: Ulyses Jarred M.D.   On: 10/05/2019 01:27   CT ANGIO CHEST PE W OR WO CONTRAST  Result Date: 10/05/2019 CLINICAL DATA:  Breast carcinoma.  Tachycardia and pain EXAM: CT ANGIOGRAPHY CHEST WITH CONTRAST TECHNIQUE: Multidetector CT imaging of the chest was performed using the standard protocol during bolus administration of intravenous contrast. Multiplanar CT image reconstructions and MIPs were obtained to evaluate the Horne anatomy. CONTRAST:  54m OMNIPAQUE IOHEXOL 350 MG/ML SOLN COMPARISON:  Chest radiograph October 05, 2019; PET-CT June 20, 2019 FINDINGS: Cardiovascular: There is no demonstrable pulmonary embolus. There is no appreciable thoracic aortic aneurysm or dissection. The visualized great vessels appear unremarkable. Right innominate and left common carotid arteries arise as a common trunk, an anatomic variant. There is no pericardial effusion or pericardial thickening. Port-A-Cath tip is in the superior vena cava. Mediastinum/Nodes: Thyroid appears unremarkable. There are several subcentimeter lymph nodes. There is no frank adenopathy by size criteria. There is a small hiatal hernia. Lungs/Pleura: There is a cavitary nodular lesion in the  left apex measuring 4.5 x 3.8 cm. There is mild pneumonitis adjacent to this apparent mass. There is mild bibasilar atelectasis. No pleural effusions evident. Upper Abdomen: The liver contour is nodular, raising concern for a  degree of underlying hepatic cirrhosis. There is an area of decreased attenuation in the left lobe of the liver measuring 4.7 x 2.5 cm, concerning for potential metastasis. There are ill-defined lesions throughout the right lobe of the liver is well consistent with suspected multifocal metastatic disease. Visualized upper abdominal structures otherwise appear unremarkable. Musculoskeletal: No blastic or lytic bone lesions are demonstrable. Port is noted anteriorly on the right. Postoperative change noted in left breast. Review of the MIP images confirms the above findings. IMPRESSION: 1. No demonstrable pulmonary embolus. No thoracic aortic aneurysm or dissection. 2. Cavitary mass in the left apex region with mild surrounding pneumonitis. This lesion measures 4.5 x 3.8 cm. Neoplastic focus felt to be likely. 3. Liver has a somewhat nodular contour, likely due to underlying hepatic cirrhosis. Multiple mass lesions are noted throughout the liver, likely metastatic disease. 4.  No adenopathy by size criteria. 5.  Postoperative change left breast. 6.  Small hiatal hernia. Electronically Signed   By: Lowella Grip III M.D.   On: 10/05/2019 07:59   CT Cervical Spine Wo Contrast  Result Date: 10/05/2019 CLINICAL DATA:  Fall EXAM: CT HEAD WITHOUT CONTRAST CT CERVICAL SPINE WITHOUT CONTRAST TECHNIQUE: Multidetector CT imaging of the head and cervical spine was performed following the standard protocol without intravenous contrast. Multiplanar CT image reconstructions of the cervical spine were also generated. COMPARISON:  None. FINDINGS: CT HEAD FINDINGS Brain: There is no mass, hemorrhage or extra-axial collection. The size and configuration of the ventricles and extra-axial CSF spaces are normal. There is diffuse severe white matter hypoattenuation, possibly a sequela of radiation therapy. Horne: No abnormal hyperdensity of the major intracranial arteries or dural venous sinuses. No intracranial atherosclerosis.  Skull: The visualized skull base, calvarium and extracranial soft tissues are normal. Sinuses/Orbits: No fluid levels or advanced mucosal thickening of the visualized paranasal sinuses. No mastoid or middle ear effusion. The orbits are normal. CT CERVICAL SPINE FINDINGS Alignment: No static subluxation. Facets are aligned. Occipital condyles are normally positioned. Skull base and vertebrae: No acute fracture. Soft tissues and spinal canal: No prevertebral fluid or swelling. No visible canal hematoma. Disc levels: Multilevel facet arthrosis. There is fusion of the right C2 and C3 facets. Upper chest: Large cavitary lesion of the left lung apex, measuring up to 3.5 cm. Other: Normal visualized paraspinal cervical soft tissues. IMPRESSION: 1. Severe chronic white matter disease, likely post radiation changes. No acute intracranial abnormality. 2. No acute fracture or static subluxation of the cervical spine. 3. Large cavitary lesion of the left lung apex, measuring up to 3.5 cm, likely a metastasis. Electronically Signed   By: Ulyses Jarred M.D.   On: 10/05/2019 01:27   MR BRAIN W WO CONTRAST  Result Date: 11/02/2019 CLINICAL DATA:  Metastatic breast cancer, follow-up EXAM: MRI HEAD WITHOUT AND WITH CONTRAST TECHNIQUE: Multiplanar, multiecho pulse sequences of the brain and surrounding structures were obtained without and with intravenous contrast. CONTRAST:  50m GADAVIST GADOBUTROL 1 MMOL/ML IV SOLN COMPARISON:  08/31/2019 FINDINGS: There is motion artifact particularly on postcontrast imaging. Brain: There is suboptimal comparison with the prior study due to artifact. Enhancing lesions do not appear substantially changed. Several of the lesions are not identified, but this is probably secondary to small size in the setting of artifact. There  is no definite new lesion. No new edema. There is no acute infarction or intracranial hemorrhage. Confluent areas of T2 hyperintensity in the supratentorial white matter  likely reflects therapy related change. There is no hydrocephalus. Horne: Major vessel flow voids at the skull base are preserved. Skull and upper cervical spine: Normal marrow signal is preserved. Sinuses/Orbits: Paranasal sinuses are aerated. Orbits are unremarkable. Other: Sella is unremarkable.  Mastoid air cells are clear. IMPRESSION: Suboptimal evaluation and comparison due to motion artifact. However, there is no evidence of new or progressive disease. Electronically Signed   By: Macy Mis M.D.   On: 11/02/2019 12:57   US Venous Img Lower Bilateral  Result Date: 11/01/2019 CLINICAL DATA:  64 year old female with leg pain EXAM: BILATERAL LOWER EXTREMITY VENOUS DOPPLER ULTRASOUND TECHNIQUE: Gray-scale sonography with graded compression, as well as color Doppler and duplex ultrasound were performed to evaluate the lower extremity deep venous systems from the level of the common femoral vein and including the common femoral, femoral, profunda femoral, popliteal and calf veins including the posterior tibial, peroneal and gastrocnemius veins when visible. The superficial great saphenous vein was also interrogated. Spectral Doppler was utilized to evaluate flow at rest and with distal augmentation maneuvers in the common femoral, femoral and popliteal veins. COMPARISON:  None. FINDINGS: RIGHT LOWER EXTREMITY Common Femoral Vein: Noncompressible. The lumen is expanded and filled with heterogeneous internal echoes. No evidence of flow on color Doppler imaging. Findings are consistent with occlusive thrombus. Saphenofemoral Junction: Occlusive thrombus extends into the proximal saphenofemoral junction. Profunda Femoral Vein: Occlusive thrombus extends into the profunda femoral vein. Femoral Vein: Occlusive thrombus extends throughout the femoral vein in the thigh. Popliteal Vein: Occlusive thrombus extends through the popliteal vein. Calf Veins: Occlusive thrombus extends into the posterior tibial, peroneal  and gastrocnemius veins. Superficial Great Saphenous Vein: Positive thrombus in the small saphenous vein. The great saphenous vein is patent and compressible. Venous Reflux:  None. Other Findings:  None. LEFT LOWER EXTREMITY Common Femoral Vein: Partially compressible. There is some eccentric wall thickening in the left common vein consistent with chronic DVT. Saphenofemoral Junction: Eccentric wall thickening extends into the saphenofemoral junction. Profunda Femoral Vein: Wall thickening extends into the profunda femoral vein. Femoral Vein: Wall thickening extends into the femoral vein. Popliteal Vein: The popliteal vein is only partially compressible. The lumen is expanded and filled with heterogeneous echoes. There is a segment of the vein without evidence of Horne flow on color Doppler consistent with occlusive DVT. Calf Veins: No evidence of thrombus. Normal compressibility and flow on color Doppler imaging. Superficial Great Saphenous Vein: Noncompressible. Echogenic material noted throughout the great saphenous vein. Venous Reflux:  None. Other Findings:  None. IMPRESSION: 1. Positive for extensive occlusive and likely acute DVT throughout the right lower extremity. 2. Positive for what appears to be a combination of acute and chronic DVT in the left lower extremity. There is a relatively short segment of occlusive and likely acute DVT in the popliteal vein with findings more consistent with chronic, or nonocclusive DVT in the remaining venous structures. Electronically Signed   By: Jacqulynn Cadet M.D.   On: 11/01/2019 14:36   DG Chest Port 1 View  Result Date: 10/04/2019 CLINICAL DATA:  Fevers. EXAM: PORTABLE CHEST 1 VIEW COMPARISON:  06/04/2017, 06/20/2019 PET-CT FINDINGS: Cardiac shadows within normal limits. Right chest wall port is again seen and stable. Changes of prior left mastectomy are seen. No acute bony abnormality is seen. No focal infiltrate is noted. IMPRESSION: No acute  abnormality  noted. Electronically Signed   By: Inez Catalina M.D.   On: 10/04/2019 23:41   DG ESOPHAGUS W DOUBLE CM (HD)  Result Date: 10/27/2019 CLINICAL DATA:  Dysphagia. Difficulty swallowing pills and solid food. EXAM: ESOPHOGRAM/BARIUM SWALLOW TECHNIQUE: Combined double contrast and single contrast examination performed using effervescent crystals, thick barium liquid, and thin barium liquid. FLUOROSCOPY TIME:  Fluoroscopy Time:  1 minutes 18 seconds. Radiation Exposure Index (if provided by the fluoroscopic device): 27.2 mGy COMPARISON:  CT chest 10/05/2019. FINDINGS: Standard double and single contrast barium swallow performed. A prominent cervical esophageal web is noted. No evidence of aspiration. Thoracic esophagus is widely patent. Peristalsis normal. No tablet given due to the upper esophageal web. IMPRESSION: 1.  Prominent cervical esophageal web.  No evidence of aspiration. 2. Thoracic esophagus is normal. No evidence of hiatal hernia or reflux. Electronically Signed   By: Marcello Moores  Register   On: 10/27/2019 11:54   Assessment/Plan The patient is a 64 year old female with multiple medical issues (see below) including past medical history of DVT/PE s/p IVC filter placement in 2018 and recurrent metastatic breast cancer to the bone liver and brain who arrived to the [facility] emergency department via EMS due to increased weakness. 1. DVT: Patient with history of DVT/PE and IVC filter placement 2018.  I do not see any epic notation that the IVC filter was removed.  I will order a stat KUB to confirm its presence.  Even though the patient has an extensive acute right lower extremity DVT unfortunately due to the presence of brain metastasis we are unable to offer thrombolysis /thrombectomy as TPA is contraindicated.  Recommend continuing heparin with transition to Eliquis.  If the presence of an IVC filter is not confirmed we will plan on placement tomorrow with Dr. Delana Meyer.  Would  recommend elevation of the bilateral lower extremity.  Once the presence of an IVC filter is confirmed would also recommend possible under wraps or compression to the bilateral lower extremity to help with symptoms.  2. Generalized Weakness: Optimize nutrition.   PT/OT evaulation ? SNF placement ?  Eligibility for visiting nurse services to assist patient  3. Hyperlipidemia: Encouraged good control as its slows the progression of atherosclerotic disease  Discussed with Dr. Francene Castle, PA-C  11/02/2019 3:10 PM  This note was created with Dragon medical transcription system.  Any error is purely unintentional

## 2019-11-02 NOTE — TOC Initial Note (Signed)
Transition of Care [facility]) - Initial/Assessment Note    Patient Details  Name: Sarah Horne MRN: [id number] Date of Birth: 03/26/1956  Transition of Care [facility]) CM/SW Contact:    Elease Hashimoto, LCSW Phone Number: 11/02/2019, 9:12 AM  Clinical Narrative:  Met with pt who reports she lives alone and has been able to take care of herself. She is having issues with eating and sore throat but tries to push herself. Her brother is local and supportive and checks on her. She has a son and daughter who live 30 minutes away but are involved. She drives and was still having [facility] PT work with her on her strengthening. She uses a rw and has a bsc. Was going to get a shower chair but trying to do without due to private pay. Pt has medical issues and possible thrombectomy tomorrow. Will continue to work on discharge needs, will see if pt able to go home alone and be able to take care of her needs. Will touch base with brother, received permission from pt to call him. Will follow for any discharge needs.               Expected Discharge Plan: [place] Barriers to Discharge: Continued Medical Work up   Patient Goals and CMS Choice Patient states their goals for this hospitalization and ongoing recovery are:: Hope to feel better and get back home      Expected Discharge Plan and Services Expected Discharge Plan: [place] In-house Referral: Clinical Social Work     Living arrangements for the past 2 months: Single Family Home                                      Prior Living Arrangements/Services Living arrangements for the past 2 months: Single Family Home Lives with:: Self   Do you feel safe going back to the place where you live?: Yes          Current home services: DME, Home PT, Home RN(has rw, 3 in 1 and [facility] was seeing her at home prior to admission)    Activities of Daily Living Home Assistive Devices/Equipment: None ADL  Screening (condition at time of admission) Patient's cognitive ability adequate to safely complete daily activities?: Yes Is the patient deaf or have difficulty hearing?: No Does the patient have difficulty seeing, even when wearing glasses/contacts?: No Does the patient have difficulty concentrating, remembering, or making decisions?: No Patient able to express need for assistance with ADLs?: Yes Does the patient have difficulty dressing or bathing?: No Independently performs ADLs?: Yes (appropriate for developmental age) Does the patient have difficulty walking or climbing stairs?: Yes Weakness of Legs: Both Weakness of Arms/Hands: None  Permission Sought/Granted Permission sought to share information with : Family Supports Permission granted to share information with : Yes, Verbal Permission Granted  Share Information with NAME: Sarah Horne     Permission granted to share info w Relationship: Brother  Permission granted to share info w Contact Information: pt verbal  Emotional Assessment Appearance:: Appears older than stated age Attitude/Demeanor/Rapport: Gracious Affect (typically observed): Accepting, Quiet Orientation: : Oriented to Self, Oriented to Place, Oriented to  Time, Oriented to Situation Alcohol / Substance Use: Tobacco Use(currently smokes) Psych Involvement: No (comment)  Admission diagnosis:  Dehydration [E86.0] Generalized weakness [R53.1] Acute deep vein thrombosis (DVT) of femoral vein of right lower  extremity ([place]) [I82.411] Acute deep vein thrombosis (DVT) of both lower extremities, unspecified vein ([place]) [I82.403] Patient Active Problem List   Diagnosis Date Noted  . Chemotherapy-induced fatigue 11/02/2019  . Convalescence following chemotherapy 11/02/2019  . Leukopenia due to antineoplastic chemotherapy ([place]) 11/02/2019  . Elevated serum creatinine 11/02/2019  . Acute deep vein thrombosis (DVT) of femoral vein of right lower extremity ([place]) 11/01/2019  .  Accidental fall from chair, initial encounter 10/05/2019  . Metastatic breast cancer ([place]) 10/05/2019  . Sepsis ([place]) 10/05/2019  . Tachyarrhythmia 10/05/2019  . Brain metastasis ([place]) 05/19/2017  . Blood in stool   . Polyp of sigmoid colon   . Benign neoplasm of transverse colon   . Benign neoplasm of ascending colon   . Lower GI bleed   . Weakness of right lower extremity 02/11/2017  . Orthostatic lightheadedness 01/14/2017  . Atrial tachycardia ([place]) 01/14/2017  . Hypokalemia 01/14/2017  . Pulmonary emboli ([place]) 01/12/2017  . Dehydration 12/25/2016  . Blood loss anemia 12/15/2016  . Ulceration of colon   . Heartburn   . Duodenitis   . Gastritis without bleeding   . GI bleed   . Acute esophagitis   . Acute esophagogastric ulcer   . Rectal bleed   . Hypovolemic shock ([place])   . Ischemic leg 12/04/2016  . Chemotherapy induced neutropenia ([place]) 11/20/2016  . Palliative care by specialist   . Goals of care, counseling/discussion   . DNR (do not resuscitate) discussion   . Cerebral edema ([place]) 11/10/2016  . Encounter for monitoring cardiotoxic drug therapy 11/09/2016  . Elevated LFTs 10/20/2016  . Peripheral neuropathy due to chemotherapy ([place]) 03/13/2016  . Encounter for antineoplastic chemotherapy 03/13/2016  . HLD (hyperlipidemia) 01/29/2016  . Hyperglycemia, unspecified 01/29/2016  . Cardiomyopathy ([place]) 01/13/2016  . Hypertension 07/18/2015  . Carcinoma of upper-outer quadrant of left breast in female, estrogen receptor positive ([place]) 12/27/2014   PCP:  Delsa Grana, PA-C Pharmacy:   [id number] [place] [place] [address]Phone: [phone]: [phone] [facility] [address] [place] [address]Phone: [phone]: [phone] [facility] [address]Phone: [phone]: [address]Phone: [phone]: [phone]    Social Determinants of Health (SDOH) Interventions    Readmission Risk Interventions No flowsheet data found.

## 2019-11-02 NOTE — Progress Notes (Signed)
Spoke with pharmacy about heparin drip, per pharmacy to stop the drip for an hour due to lab results were to high. Per pharmacy will new order.

## 2019-11-02 NOTE — Evaluation (Signed)
Clinical/Bedside Swallow Evaluation Patient Details  Name: Sarah Horne MRN: [id number] Date of Birth: July 13, 1956  Today's Date: 11/02/2019 Time: SLP Start Time (ACUTE ONLY): 1010 SLP Stop Time (ACUTE ONLY): 1119 SLP Time Calculation (min) (ACUTE ONLY): 69 min  Past Medical History:  Past Medical History:  Diagnosis Date  . Chemotherapy-induced peripheral neuropathy ([place])   . Epistaxis    a. 11/2016 in setting of asa/plavix-->silver nitrate cauterization.  . GI bleed    a. 11/2016 Admission w/ GIB and hypovolemic shock req 3u PRBC's;  b. 11/2016 ECG: gastritis & nonbleeding peptic ulcer; c. 11/2016 Conlonoscopy: rectal and sigmoid colonic ulcers.  . Heart attack ([place])    a. 1998 Cath @ [facility]: reportedly no intervention required.  Marland Kitchen Herceptin-induced cardiomyopathy ([place])    a. In the setting of Herceptin Rx for breast cancer (initiated 12/2014); b. 03/2015 MUGA EF 64%; b. 08/2015 MUGA: EF 51%; c. 10/2015 MUGA: EF 44%; d. 11/2015 Echo: EF 45-50%; e. 01/2016 MUGA: EF 60%; f. 06/2016 MUGA EF 65%; g. 10/2016 MUGA: EF 61%;  h. 12/2016 Echo: EF 55-60%, gr1 DD.  Marland Kitchen Hyperlipidemia   . Hypertension   . Neuropathy   . Possible PAD (peripheral artery disease) ([place])    a. 11/2016 LE cyanosis and weak pulses-->CTA w/o significant Ao-BiFem dzs. ? distal dzs-->ASA/Plavix initiated by vascular surgery.  Marland Kitchen PSVT (paroxysmal supraventricular tachycardia) ([place])    a. Dx 11/2016.  . Pulmonary embolism ([place])    a. 12/2016 CTA Chest: small nonocclusive PE in inferior segment of the Left lingula, somewhat eccentric filling defect suggesting chronic rather than acute embolic event; b. 11/5359 LE U/S:  No DVT; c. 12/2016 Echo: Nl RV fxn, nl PASP.  Marland Kitchen Recurrent Metastatic breast cancer ([place])    a. Dx 2016: Stage II, ER positive, PR positive, HER-2/neu overexpressing of the left breast-->chemo/radiation; b. 10/2016 CT Abd/pelvis: diffuse liver mets, ill defined sclerotic bone lesions-T12;  c. 10/2016 MRI brain: metastatic lesion along  L temporal lobe (19x44m) w/ extensive surrounding edema & 558mmidline shift to right.  . Sepsis ([place]  . Sinus tachycardia    Past Surgical History:  Past Surgical History:  Procedure Laterality Date  . BREAST BIOPSY Left 2016   Positive  . BREAST LUMPECTOMY WITH SENTINEL LYMPH NODE BIOPSY Left 05/23/2015   Procedure: LEFT BREAST WIDE EXCISION WITH AXILLARY DISSECTION, MASTOPLASTY ;  Surgeon: JeRobert BellowMD;  Location: [facility] ORS;  Service: General;  Laterality: Left;  . BREAST SURGERY Left 12/18/14   breast biopsy/INVASIVE DUCTAL CARCINOMA OF BREAST, NOTTINGHAM GRADE 2.  . Marland KitchenREAST SURGERY  05/23/2015.   Wide excision/mastoplasty, axillary dissection. No residual invasive cancer, positive for residual DCIS. 0/2 nodes identified on axillary dissection. (no SLN by technetium or methylene blue)  . CARDIAC CATHETERIZATION    . COLONOSCOPY WITH PROPOFOL N/A 12/10/2016   Procedure: COLONOSCOPY WITH PROPOFOL;  Surgeon: DaLucilla LameMD;  Location: [facility] ENDOSCOPY;  Service: Endoscopy;  Laterality: N/A;  . COLONOSCOPY WITH PROPOFOL N/A 02/13/2017   Procedure: COLONOSCOPY WITH PROPOFOL;  Surgeon: WoLucilla LameMD;  [facility];  Service: Endoscopy;  Laterality: N/A;  . ESOPHAGOGASTRODUODENOSCOPY (EGD) WITH PROPOFOL N/A 12/08/2016   Procedure: ESOPHAGOGASTRODUODENOSCOPY (EGD) WITH PROPOFOL;  Surgeon: DaLucilla LameMD;  Location: [facility] ENDOSCOPY;  Service: Endoscopy;  Laterality: N/A;  . IVC FILTER INSERTION N/A 02/15/2017   Procedure: IVC Filter Insertion;  Surgeon: DeAlgernon HuxleyMD;  [facility];  Service: Cardiovascular;  Laterality: N/A;  . PORTACATH PLACEMENT Right 12-31-14  Dr Bary Castilla   HPI: Per admitting History and Physical:   "BRETTE CAST is an 64 y.o. female with medical history significant for pulmonary embolism in 2018, Herceptin-induced cardiomyopathy, PSVT, sinus tachycardia, chemotherapy-induced peripheral neuropathy, epistaxis, CAD (heart attack in  1998), stage IV left breast cancer with metastasis to the liver, brain, T12 vertebra, hypertension, hyperlipidemia.  She presented to the hospital because of generalized weakness and right leg pain.  She said that the pain in the right leg started yesterday.  Pain is graded as severe (8/10).  She said it felt like something was pressing against her leg.  She has not been able to walk since yesterday because of severe right leg pain.  There are no known relieving or aggravating factors and pain is nonradiating.  She said she she has had swelling of the right lower extremity for almost 2 weeks now.  Swelling has progressively worsened.  She has been feeling weak and tired.  She said she slid out of bed this morning and once unable to get up.  She called EMS who brought her to the hospital for further evaluation.  She said she had radiation therapy several weeks ago and she had chemotherapy just last Friday (October 27, 2019)."  Assessment / Plan / Recommendation Clinical Impression  Bedside swallow eval revealed inconsistent s/s of aspiration with multiple sips of thin liquids as well as indication of pharyngeal dysphagia. Pt reports poor intake because of her difficulty swallowing and needing extra time to eat. Oral mech exam revealed structures to be functioning adequately with no apparent weakness. Noted coughing after multiple sips of thing liquids however, Pt tolerated single sips well. Pt also tolerated applesauce consistency well with no complaints although she took extended time to propel the bolus likely for fear of food getting stuck. Given a small piece of graham cracker, Pt. reported feeling like the cookie was stuck and needed subsequent sips of water to follow. Noted Pt had a barium swallow 10/27/19 with results indicating "prominent cervical web". Rec Dys 2 diet with meds crushed in applesauce. Rec attending MD order GI consult for possible dilatation. Pt reports poor intake at home over the few  months. ST to follow up with toleration of diet and adjust as needed SLP Visit Diagnosis: Dysphagia, oropharyngeal phase (R13.12)    Aspiration Risk  Mild aspiration risk;Risk for inadequate nutrition/hydration    Diet Recommendation Dysphagia 2 (Fine chop)   Liquid Administration via: Straw Medication Administration: Crushed with puree Supervision: Intermittent supervision to cue for compensatory strategies Compensations: Slow rate;Small sips/bites;Follow solids with liquid Postural Changes: Seated upright at 90 degrees;Remain upright for at least 30 minutes after po intake    Other  Recommendations Recommended Consults: Consider GI evaluation   Follow up Recommendations        Frequency and Duration min 1 x/week  1 week       Prognosis Prognosis for Safe Diet Advancement: Good Barriers to Reach Goals: Other (Comment)(GI issues)      Swallow Study   General Type of Study: Bedside Swallow Evaluation Diet Prior to this Study: Regular Temperature Spikes Noted: No Respiratory Status: Room air History of Recent Intubation: No Behavior/Cognition: Alert;Cooperative;Pleasant mood Oral Cavity Assessment: Within Functional Limits Oral Care Completed by SLP: No Oral Cavity - Dentition: Adequate natural dentition Self-Feeding Abilities: Needs assist Patient Positioning: Upright in bed Baseline Vocal Quality: Normal Volitional Cough: Strong    Oral/Motor/Sensory Function Overall Oral Motor/Sensory Function: Within functional limits   Ice Chips Ice chips:  Within functional limits Presentation: Self Fed   Thin Liquid Thin Liquid: Within functional limits Presentation: Cup;Self Fed;Spoon;Straw    Nectar Thick Nectar Thick Liquid: Not tested   Honey Thick Honey Thick Liquid: Not tested   Puree Puree: Within functional limits   Solid     Solid: Impaired Presentation: Self Fed Pharyngeal Phase Impairments: Other (comments)(C/o hard to swallow)      Lucila Maine 11/02/2019,11:36 AM

## 2019-11-03 ENCOUNTER — Encounter: Payer: Self-pay | Admitting: Internal Medicine

## 2019-11-03 ENCOUNTER — Inpatient Hospital Stay: Payer: Medicare HMO

## 2019-11-03 ENCOUNTER — Inpatient Hospital Stay: Payer: Medicare HMO | Admitting: Anesthesiology

## 2019-11-03 ENCOUNTER — Inpatient Hospital Stay: Payer: Medicare HMO | Admitting: Internal Medicine

## 2019-11-03 ENCOUNTER — Encounter
Admission: EM | Disposition: A | Discharge status: Home Health | Payer: Self-pay | Source: Home / Self Care | Attending: Internal Medicine

## 2019-11-03 DIAGNOSIS — K222 Esophageal obstruction: Secondary | ICD-10-CM

## 2019-11-03 HISTORY — PX: ESOPHAGOGASTRODUODENOSCOPY: SHX5428

## 2019-11-03 LAB — CBC
HCT: 29.9 % — ABNORMAL LOW (ref 36.0–46.0)
Hemoglobin: 9.5 g/dL — ABNORMAL LOW (ref 12.0–15.0)
MCH: 31.5 pg (ref 26.0–34.0)
MCHC: 31.8 g/dL (ref 30.0–36.0)
MCV: 99 fL (ref 80.0–100.0)
Platelets: 149 10*3/uL — ABNORMAL LOW (ref 150–400)
RBC: 3.02 MIL/uL — ABNORMAL LOW (ref 3.87–5.11)
RDW: 18.8 % — ABNORMAL HIGH (ref 11.5–15.5)
WBC: 1.7 10*3/uL — ABNORMAL LOW (ref 4.0–10.5)
nRBC: 0 % (ref 0.0–0.2)

## 2019-11-03 LAB — BASIC METABOLIC PANEL
Anion gap: 14 (ref 5–15)
BUN: 23 mg/dL (ref 8–23)
CO2: 21 mmol/L — ABNORMAL LOW (ref 22–32)
Calcium: 8.5 mg/dL — ABNORMAL LOW (ref 8.9–10.3)
Chloride: 101 mmol/L (ref 98–111)
Creatinine, Ser: 0.74 mg/dL (ref 0.44–1.00)
GFR calc Af Amer: >60 mL/min (ref >60)
GFR calc non Af Amer: >60 mL/min (ref >60)
Glucose, Bld: 78 mg/dL (ref 70–99)
Potassium: 4.2 mmol/L (ref 3.5–5.1)
Sodium: 136 mmol/L (ref 135–145)

## 2019-11-03 LAB — HEPARIN LEVEL (UNFRACTIONATED): Heparin Unfractionated: 0.16 IU/mL — ABNORMAL LOW (ref 0.30–0.70)

## 2019-11-03 SURGERY — EGD (ESOPHAGOGASTRODUODENOSCOPY)
Anesthesia: General

## 2019-11-03 SURGERY — IVC FILTER INSERTION
Anesthesia: Moderate Sedation

## 2019-11-03 MED ORDER — SODIUM CHLORIDE 0.9 % IV SOLN: INTRAVENOUS | Status: DC

## 2019-11-03 MED ORDER — HEPARIN (PORCINE) 25000 UT/250ML-% IV SOLN
850.0000 [IU]/h | INTRAVENOUS | Status: AC
  Administered 2019-11-03: 750 [IU]/h via INTRAVENOUS
  Administered 2019-11-04: 850 [IU]/h via INTRAVENOUS

## 2019-11-03 MED ORDER — PROPOFOL 10 MG/ML IV BOLUS
INTRAVENOUS | Status: DC | PRN
  Administered 2019-11-03: 20 mg via INTRAVENOUS
  Administered 2019-11-03: 30 mg via INTRAVENOUS
  Administered 2019-11-03 (×3): 20 mg via INTRAVENOUS

## 2019-11-03 MED ORDER — PROPOFOL 500 MG/50ML IV EMUL
INTRAVENOUS | Status: DC | PRN
  Administered 2019-11-03: 75 ug/kg/min via INTRAVENOUS

## 2019-11-03 MED ORDER — PROPOFOL 10 MG/ML IV BOLUS
INTRAVENOUS | Status: AC
  Filled 2019-11-03: qty 20

## 2019-11-03 MED ORDER — DEXMEDETOMIDINE HCL 200 MCG/2ML IV SOLN
INTRAVENOUS | Status: DC | PRN
  Administered 2019-11-03 (×2): 4 ug via INTRAVENOUS

## 2019-11-03 MED ORDER — SODIUM CHLORIDE 0.9 % IV SOLN: INTRAVENOUS | Status: DC | PRN

## 2019-11-03 MED ORDER — PHENYLEPHRINE HCL (PRESSORS) 10 MG/ML IV SOLN
INTRAVENOUS | Status: DC | PRN
  Administered 2019-11-03 (×4): 100 ug via INTRAVENOUS

## 2019-11-03 MED ORDER — HEPARIN (PORCINE) 25000 UT/250ML-% IV SOLN
750.0000 [IU]/h | INTRAVENOUS | Status: AC
  Administered 2019-11-03: 09:00:00 750 [IU]/h via INTRAVENOUS

## 2019-11-03 NOTE — Plan of Care (Addendum)
Hep drip stopped at 1030 - per OR request for 1430 EGD.  Verbal consent received via phone from Brother/POA d/t patient AMS (2 RN confirmed verbal consent).  Consent placed on chart.  Report given to Candis Shine, RN (OR). Has been NPO since midnight and CHG completed.

## 2019-11-03 NOTE — Transfer of Care (Signed)
Immediate Anesthesia Transfer of Care Note  Patient: Sarah Horne  Procedure(s) Performed: ESOPHAGOGASTRODUODENOSCOPY (EGD) (N/A )  Patient Location: PACU and Endoscopy Unit  Anesthesia Type:General  Level of Consciousness: drowsy  Airway & Oxygen Therapy: Patient Spontanous Breathing and Patient connected to face mask oxygen  Post-op Assessment: Report given to RN and Post -op Vital signs reviewed and stable  Post vital signs: Reviewed and stable  Last Vitals:  Vitals Value Taken Time  BP    Temp    Pulse    Resp    SpO2      Last Pain:  Vitals:   11/03/19 1618  TempSrc: Temporal  PainSc: 0-No pain      Patients Stated Pain Goal: 3 (30/16/01 0932)  Complications: No apparent anesthesia complications

## 2019-11-03 NOTE — Care Management Important Message (Signed)
Important Message  Patient Details  Name: Sarah Horne MRN: [id number] Date of Birth: November 27, 1955   Medicare Important Message Given:  Yes     [name] [name] 11/03/2019, 10:49 AM

## 2019-11-03 NOTE — Progress Notes (Signed)
[place] for heparin Indication: DVT  Allergies  Allergen Reactions  . No Known Allergies    Patient Measurements: Height: 5' 6"  (167.6 cm) Weight: 162 lb 7.7 oz (73.7 kg) IBW/kg (Calculated) : 59.3 Heparin Dosing Weight: 72.6 kg  Vital Signs: Temp: 98.6 F (37 C) (02/05 1000) Temp Source: Oral (02/05 1000) BP: 128/72 (02/05 1000) Pulse Rate: 110 (02/05 1000)  Labs: Recent Labs    11/01/19 1425 11/01/19 2214 11/02/19 0623 11/02/19 0623 11/02/19 1558 11/02/19 2148 11/03/19 0901  HGB 10.5*  --  10.3*  --   --   --  9.5*  HCT 32.2*  --  31.0*  --   --   --  29.9*  PLT 131*  --  138*  --   --   --  149*  APTT 52*  --   --   --   --   --   --   LABPROT 14.9  --   --   --   --   --   --   INR 1.2  --   --   --   --   --   --   HEPARINUNFRC  --    < > 1.16*   < > 0.35 0.34 0.16*  CREATININE 1.27*  --  0.98  --   --   --  0.74   < > = values in this interval not displayed.    Estimated Creatinine Clearance: 72.1 mL/min (by C-G formula based on SCr of 0.74 mg/dL).  Medical History: Past Medical History:  Diagnosis Date  . Chemotherapy-induced peripheral neuropathy ([place])   . Epistaxis    a. 11/2016 in setting of asa/plavix-->silver nitrate cauterization.  . GI bleed    a. 11/2016 Admission w/ GIB and hypovolemic shock req 3u PRBC's;  b. 11/2016 ECG: gastritis & nonbleeding peptic ulcer; c. 11/2016 Conlonoscopy: rectal and sigmoid colonic ulcers.  . Heart attack ([place])    a. 1998 Cath @ [facility]: reportedly no intervention required.  Marland Kitchen Herceptin-induced cardiomyopathy ([place])    a. In the setting of Herceptin Rx for breast cancer (initiated 12/2014); b. 03/2015 MUGA EF 64%; b. 08/2015 MUGA: EF 51%; c. 10/2015 MUGA: EF 44%; d. 11/2015 Echo: EF 45-50%; e. 01/2016 MUGA: EF 60%; f. 06/2016 MUGA EF 65%; g. 10/2016 MUGA: EF 61%;  h. 12/2016 Echo: EF 55-60%, gr1 DD.  Marland Kitchen Hyperlipidemia   . Hypertension   . Neuropathy   . Possible PAD (peripheral artery  disease) ([place])    a. 11/2016 LE cyanosis and weak pulses-->CTA w/o significant Ao-BiFem dzs. ? distal dzs-->ASA/Plavix initiated by vascular surgery.  Marland Kitchen PSVT (paroxysmal supraventricular tachycardia) ([place])    a. Dx 11/2016.  . Pulmonary embolism ([place])    a. 12/2016 CTA Chest: small nonocclusive PE in inferior segment of the Left lingula, somewhat eccentric filling defect suggesting chronic rather than acute embolic event; b. 04/1855 LE U/S:  No DVT; c. 12/2016 Echo: Nl RV fxn, nl PASP.  Marland Kitchen Recurrent Metastatic breast cancer ([place])    a. Dx 2016: Stage II, ER positive, PR positive, HER-2/neu overexpressing of the left breast-->chemo/radiation; b. 10/2016 CT Abd/pelvis: diffuse liver mets, ill defined sclerotic bone lesions-T12;  c. 10/2016 MRI brain: metastatic lesion along L temporal lobe (19x107m) w/ extensive surrounding edema & 516mmidline shift to right.  . Sepsis ([place]  . Sinus tachycardia     Assessment: [age]ear old female presented with increased weakness and fall. Patient receiving  chemotherapy for breast cancer with mets to liver and brain. Ultrasound lower extremities positive for extensive occlusive and likely acute DVT throughout right lower extremity as well as what appears to be a combination of acute and chronic DVT in the left lower extremity. Pharmacy consulted for heparin drip for VTE treatment.  No PTA anticoagulant of record.  Started with heparin 4000 unit bolus followed by heparin drip at 1100 units/hr  02/03 2214 HL 1.61 (held for 1 hour and reduced rate to 900 u/hr) 02/04 6606 HL 1.16 (held for 1 hour and reduced rate to 750 u/hr) 02/04 1558 HL 0.35, therapeutic 02/04 2148 HL 0.34, therapeutic x 2 02/04 0902 HL 0.16, Heparin had been stopped at 05:30   Goal of Therapy:  Heparin level 0.3-0.7 units/ml Monitor platelets by anticoagulation protocol: Yes   Plan:  2/4 AM Heparin drip was stopped inadvertantly at 05:30. Resumed at previous rate of 750 units/hr at 08:30.  Heparin to be stopped at 10:30 for EGD (needs to be off for 4 hrs prior). Will resume Heparin after procedure.  CBC daily while on Heparin.  Paulina Fusi, PharmD, BCPS 11/03/2019 1:04 PM

## 2019-11-03 NOTE — Op Note (Signed)
[facility] Gastroenterology Patient Name: Sarah Horne Procedure Date: 11/03/2019 3:40 PM MRN: [id number] Account #: [id number] Date of Birth: June 01, 1956 Admit Type: Outpatient Age: 64 Room: [facility] ENDO ROOM 3 Gender: Female Note Status: Finalized Procedure:             Upper GI endoscopy Indications:           Esophageal dysphagia Providers:             Lin Landsman MD, MD Medicines:             Monitored Anesthesia Care Complications:         No immediate complications. Estimated blood loss:                         Minimal. Procedure:             Pre-Anesthesia Assessment:                        - Prior to the procedure, a History and Physical was                         performed, and patient medications and allergies were                         reviewed. The patient is competent. The risks and                         benefits of the procedure and the sedation options and                         risks were discussed with the patient. All questions                         were answered and informed consent was obtained.                         Patient identification and proposed procedure were                         verified by the physician, the nurse, the                         anesthesiologist, the anesthetist and the technician                         in the pre-procedure area in the procedure room in the                         endoscopy suite. Mental Status Examination: alert and                         oriented. Airway Examination: normal oropharyngeal                         airway and neck mobility. Respiratory Examination:                         clear to auscultation. CV Examination: tachycardia  noted. Prophylactic Antibiotics: The patient does not                         require prophylactic antibiotics. Prior                         Anticoagulants: The patient has taken heparin, last                         dose was  day of procedure. ASA Grade Assessment: IV -                         A patient with severe systemic disease that is a                         constant threat to life. After reviewing the risks and                         benefits, the patient was deemed in satisfactory                         condition to undergo the procedure. The anesthesia                         plan was to use monitored anesthesia care (MAC).                         Immediately prior to administration of medications,                         the patient was re-assessed for adequacy to receive                         sedatives. The heart rate, respiratory rate, oxygen                         saturations, blood pressure, adequacy of pulmonary                         ventilation, and response to care were monitored                         throughout the procedure. The physical status of the                         patient was re-assessed after the procedure.                        After obtaining informed consent, the endoscope was                         passed under direct vision. Throughout the procedure,                         the patient's blood pressure, pulse, and oxygen                         saturations were monitored continuously. The Endoscope  was introduced through the mouth, and advanced to the                         second part of duodenum. The upper GI endoscopy was                         accomplished without difficulty. The patient tolerated                         the procedure fairly well. Findings:      I was unable to traverse the proximal esophagus with adult endoscope due       to meeting resistance at 18cm from esophageal web. So, switched to       pediatric endoscope, passed freely. Completed rest of the exam, then       changed back to adult endoscope, traversed the web with mild resistance.      A web was found in the proximal esophagus. A TTS dilator was passed        through the scope. Dilation with an 05-06-09 mm x 5.5 cm CRE balloon and a       12-13.5-15 mm x 5.5 cm CRE balloon dilator was performed to 15 mm. The       dilation site was examined following endoscope reinsertion and showed       mild mucosal disruption and moderate improvement in luminal narrowing.       Estimated blood loss was minimal.      The entire examined stomach was normal.      The cardia and gastric fundus were normal on retroflexion.      The duodenal bulb and second portion of the duodenum were normal.      A widely patent and non-obstructing Schatzki ring was found at the       gastroesophageal junction. Impression:            - Web in the proximal esophagus. Dilated.                        - Normal stomach.                        - Normal duodenal bulb and second portion of the                         duodenum.                        - No specimens collected. Recommendation:        - Return patient to hospital ward for ongoing care.                        - Mechanical soft diet today.                        - Repeat upper endoscopy in 2 weeks for retreatment                         with savary dilator. Procedure Code(s):     --- Professional ---                        [id number],  Esophagogastroduodenoscopy, flexible,                         transoral; with transendoscopic balloon dilation of                         esophagus (less than 30 mm diameter) Diagnosis Code(s):     --- Professional ---                        Q39.4, Esophageal web                        R13.14, Dysphagia, pharyngoesophageal phase CPT copyright 2019 American Medical Association. All rights reserved. The codes documented in this report are preliminary and upon coder review may  be revised to meet current compliance requirements. Dr. Ulyess Mort Lin Landsman MD, MD 11/03/2019 5:29:44 PM This report has been signed electronically. Number of Addenda: 0 Note Initiated On: 11/03/2019 3:40  PM Estimated Blood Loss:  Estimated blood loss was minimal.      [facility]

## 2019-11-03 NOTE — NC FL2 (Signed)
[place] LEVEL OF CARE SCREENING TOOL     IDENTIFICATION  Patient Name: Sarah Horne Birthdate: 1956-02-14 Sex: female Admission Date (Current Location): 11/01/2019  [place] and [place] Number:  Engineering geologist and Address:  [facility], [address] [place], [place] [address]      Provider Number: [id number]  Attending Physician Name and Address:  Jennye Boroughs, MD  Relative Name and Phone Number:  Beverlyn Roux [phone]    Current Level of Care: Hospital Recommended Level of Care: [place] Prior Approval Number:    Date Approved/Denied:   PASRR Number: [id number]  Discharge Plan: SNF    Current Diagnoses: Patient Active Problem List   Diagnosis Date Noted  . Chemotherapy-induced fatigue 11/02/2019  . Convalescence following chemotherapy 11/02/2019  . Leukopenia due to antineoplastic chemotherapy ([facility]) 11/02/2019  . Elevated serum creatinine 11/02/2019  . Acute deep vein thrombosis (DVT) of femoral vein of right lower extremity ([place]) 11/01/2019  . Accidental fall from chair, initial encounter 10/05/2019  . Metastatic breast cancer ([place]) 10/05/2019  . Sepsis ([place]) 10/05/2019  . Tachyarrhythmia 10/05/2019  . Brain metastasis ([place]) 05/19/2017  . Blood in stool   . Polyp of sigmoid colon   . Benign neoplasm of transverse colon   . Benign neoplasm of ascending colon   . Lower GI bleed   . Weakness of right lower extremity 02/11/2017  . Orthostatic lightheadedness 01/14/2017  . Atrial tachycardia ([place]) 01/14/2017  . Hypokalemia 01/14/2017  . Pulmonary emboli ([place]) 01/12/2017  . Dehydration 12/25/2016  . Blood loss anemia 12/15/2016  . Ulceration of colon   . Heartburn   . Duodenitis   . Gastritis without bleeding   . GI bleed   . Acute esophagitis   . Acute esophagogastric ulcer   . Rectal bleed   . Hypovolemic shock ([place])   . Ischemic leg 12/04/2016  . Chemotherapy induced  neutropenia ([place]) 11/20/2016  . Palliative care by specialist   . Goals of care, counseling/discussion   . DNR (do not resuscitate) discussion   . Cerebral edema ([place]) 11/10/2016  . Encounter for monitoring cardiotoxic drug therapy 11/09/2016  . Elevated LFTs 10/20/2016  . Peripheral neuropathy due to chemotherapy ([place]) 03/13/2016  . Encounter for antineoplastic chemotherapy 03/13/2016  . HLD (hyperlipidemia) 01/29/2016  . Hyperglycemia, unspecified 01/29/2016  . Cardiomyopathy ([place]) 01/13/2016  . Hypertension 07/18/2015  . Carcinoma of upper-outer quadrant of left breast in female, estrogen receptor positive ([place]) 12/27/2014    Orientation RESPIRATION BLADDER Height & Weight     Self, Place  Normal External catheter(pure wick) Weight: 162 lb 7.7 oz (73.7 kg) Height:  5\' 6"  (167.6 cm)  BEHAVIORAL SYMPTOMS/MOOD NEUROLOGICAL BOWEL NUTRITION STATUS      Continent Diet(Dys 2 thin liquids)  AMBULATORY STATUS COMMUNICATION OF NEEDS Skin   Limited Assist Verbally Normal                       Personal Care Assistance Level of Assistance  Bathing, Dressing Bathing Assistance: Limited assistance   Dressing Assistance: Limited assistance     Functional Limitations Info  Sight, Speech Sight Info: Impaired   Speech Info: Impaired    SPECIAL CARE FACTORS FREQUENCY  PT (By licensed PT), OT (By licensed OT), Speech therapy     PT Frequency: 5x week OT Frequency: 5 x week     Speech Therapy Frequency: 3 x week      Contractures Contractures Info: Present  Additional Factors Info  Code Status, Allergies Code Status Info: Full Code Allergies Info: no know allergies           Current Medications (11/03/2019):  This is the current hospital active medication list Current Facility-Administered Medications  Medication Dose Route Frequency Provider Last Rate Last Admin  . 0.9 %  sodium chloride infusion   Intravenous Continuous Stegmayer, Janalyn Harder, PA-C 75 mL/hr at  11/03/19 0047 New Bag at 11/03/19 0047  . acetaminophen (TYLENOL) tablet 650 mg  650 mg Oral Q6H PRN Jennye Boroughs, MD      . cetirizine (ZYRTEC) tablet 10 mg  10 mg Oral Daily Jennye Boroughs, MD   10 mg at 11/02/19 1119  . Chlorhexidine Gluconate Cloth 2 % PADS 6 each  6 each Topical Daily Jennye Boroughs, MD   6 each at 11/02/19 2229  . flecainide (TAMBOCOR) tablet 50 mg  50 mg Oral BID Jennye Boroughs, MD   50 mg at 11/02/19 2235  . fluconazole (DIFLUCAN) tablet 200 mg  200 mg Oral Daily Jennye Boroughs, MD   200 mg at 11/02/19 1119  . heparin ADULT infusion 100 units/mL (25000 units/241mL sodium chloride 0.45%)  750 Units/hr Intravenous Continuous Jennye Boroughs, MD 7.5 mL/hr at 11/03/19 0830 750 Units/hr at 11/03/19 0830  . ipratropium (ATROVENT) 0.03 % nasal spray 2 spray  2 spray Each Nare BID Jennye Boroughs, MD   2 spray at 11/02/19 2236  . magnesium oxide (MAG-OX) tablet 400 mg  400 mg Oral BID Jennye Boroughs, MD   400 mg at 11/02/19 2229  . meclizine (ANTIVERT) tablet 25 mg  25 mg Oral TID PRN Jennye Boroughs, MD      . metoprolol tartrate (LOPRESSOR) tablet 25 mg  25 mg Oral BID Jennye Boroughs, MD   25 mg at 11/02/19 2229  . montelukast (SINGULAIR) tablet 10 mg  10 mg Oral QHS Jennye Boroughs, MD   10 mg at 11/02/19 2229  . morphine 4 MG/ML injection 4 mg  4 mg Intravenous Q6H PRN Jennye Boroughs, MD      . ondansetron [facility]) injection 4 mg  4 mg Intravenous Q6H PRN Jennye Boroughs, MD      . oxyCODONE (Oxy IR/ROXICODONE) immediate release tablet 5 mg  5 mg Oral Q4H PRN Lang Snow, NP   5 mg at 11/02/19 0406  . pregabalin (LYRICA) capsule 100 mg  100 mg Oral BID Jennye Boroughs, MD   100 mg at 11/02/19 2229   Facility-Administered Medications Ordered in Other Encounters  Medication Dose Route Frequency Provider Last Rate Last Admin  . 0.9 %  sodium chloride infusion   Intravenous Continuous [name], MD   New Bag at 12/10/16 1140  . 0.9 %  sodium chloride infusion    Intravenous Continuous Lloyd Huger, MD 999 mL/hr at 04/24/15 1520 New Bag at 05/23/15 0951  . 0.9 %  sodium chloride infusion   Intravenous Continuous Verlon Au, NP 999 mL/hr at 10/31/19 1522 New Bag at 10/31/19 1522  . heparin lock flush 100 unit/mL  500 Units Intravenous Once Berenzon, Dmitriy, MD      . sodium chloride 0.9 % injection 10 mL  10 mL Intravenous PRN [name], MD   10 mL at 04/04/15 1440  . sodium chloride flush (NS) 0.9 % injection 10 mL  10 mL Intravenous PRN Roxana Hires, MD         Discharge Medications: Please see discharge summary for a list of discharge medications.  Relevant Imaging Results:  Relevant Lab Results:   Additional Information SSN: [id number]  Averill Pons, Gardiner Rhyme, LCSW

## 2019-11-03 NOTE — Progress Notes (Signed)
OT Cancellation Note  Patient Details Name: Sarah Horne MRN: [id number] DOB: [birth year]   Cancelled Treatment:    Reason Eval/Treat Not Completed: Medical issues which prohibited therapy;Fatigue/lethargy limiting ability to participate. Thank you for the OT consult. Order received and chart reviewed. Spoke with both physical therapy and pt primary RN who indicated pt has been lethargic with resting HR ranging from 110's to 130's. Pt not appropriate for OT evaluation at this time. Will hold and re-attempt at a later date/time as available and pt medically appropriate for OT evaluation.   Shara Blazing, M.S., OTR/L Ascom: [phone] 11/03/19, 2:12 PM

## 2019-11-03 NOTE — Progress Notes (Addendum)
Progress Note    GABRIELLAH RABEL  IBB:[id number] DOB: 08/24/1956  DOA: 11/01/2019 PCP: Delsa Grana, PA-C      Brief Narrative:    Medical records reviewed and are as summarized below:  LEEN TWOREK is an 64 y.o. female with medical history significant for pulmonary embolism in 2018, Herceptin-induced cardiomyopathy, PSVT, sinus tachycardia, chemotherapy-induced peripheral neuropathy, epistaxis, CAD (heart attack in 1998), stage IV left breast cancer with metastasis to the liver, brain, T12 vertebra, hypertension, hyperlipidemia.  She presented to the hospital because of generalized weakness, right leg pain, right lower extremity swelling and difficulty walking.Marland Kitchen  She was found to have extensive DVT of the right lower extremity.      Assessment/Plan:   Principal Problem:   Acute deep vein thrombosis (DVT) of femoral vein of right lower extremity (HCC) Active Problems:   Carcinoma of upper-outer quadrant of left breast in female, estrogen receptor positive ([place])   Peripheral neuropathy due to chemotherapy [facility])   Acute extensive DVT of right lower extremity/?  Acute on chronic DVT in the left lower extremity: Heparin infusion has been held for endoscopy today.  Resume IV heparin after endoscopy.  Plan for thrombectomy was aborted because of brain metastasis.  Patient already has an IVC filter in place (placed in May 2018).  PT and OT tomorrow   Hyponatremia/dehydration/elevated creatinine: Improved.  Discontinue IV fluids after endoscopy.  Chronic sinus tachycardia/history of PSVT: Continue metoprolol and flecainide.  Left breast cancer with metastasis to the liver, brain and T12: Follow-up with oncologist as an outpatient.  Dysphagia/cervical esophageal web recent thrush: Plan for endoscopy today.  Continue fluconazole (to be completed on 11/09/2019).  Peripheral neuropathy: Continue Lyrica  Elevated liver enzymes is likely due to liver metastasis.  Body mass  index is 26.22 kg/m.   Family Communication/Anticipated D/C date and plan/Code Status   DVT prophylaxis: IV heparin Code Status: Full code Family Communication: Plan discussed with the patient Disposition Plan: Possible discharge to home in 2 to 3 days      Subjective:   Chart review shows that patient was combative and confused earlier this morning.  Currently, she is fine.  She complained that she has had nothing to eat this morning.  No pain in the right leg.  Objective:    Vitals:   11/03/19 0014 11/03/19 0459 11/03/19 0804 11/03/19 1000  BP: (!) 120/91 114/77 126/84 128/72  Pulse: (!) 110 (!) 115 (!) 117 (!) 110  Resp: 16  18   Temp: 98.5 F (36.9 C)  97.8 F (36.6 C)   TempSrc: Oral  Oral   SpO2: 100% 100% 100%   Weight:      Height:        Intake/Output Summary (Last 24 hours) at 11/03/2019 1140 Last data filed at 11/03/2019 0500 Gross per 24 hour  Intake 313.46 ml  Output --  Net 313.46 ml   Filed Weights   11/01/19 1249 11/01/19 1820  Weight: 72.6 kg 73.7 kg    Exam:  GEN: NAD SKIN: No rash EYES: No pallor or icterus  ENT: MMM CV: RRR PULM: No wheezing or rales heard  ABD: soft, ND, NT, +BS CNS: AAO x 3, non focal EXT: Significant swelling of entire right lower extremity from thigh down to the foot.  No erythema or tenderness.   Data Reviewed:   I have personally reviewed following labs and imaging studies:  Labs: Labs show the following:   Basic Metabolic Panel: Recent Labs  Lab 10/31/19 1453 10/31/19 1453 11/01/19 1425 11/01/19 1425 11/02/19 0623 11/03/19 0901  NA 133*  --  132*  --  136 136  K 4.6   < > 4.8   < > 4.5 4.2  CL 97*  --  97*  --  101 101  CO2 22  --  25  --  25 21*  GLUCOSE 119*  --  118*  --  90 78  BUN 27*  --  31*  --  28* 23  CREATININE 1.23*  --  1.27*  --  0.98 0.74  CALCIUM 9.1  --  9.1  --  8.9 8.5*  MG  --   --  2.7*  --   --   --    < > = values in this interval not displayed.   GFR Estimated  Creatinine Clearance: 72.1 mL/min (by C-G formula based on SCr of 0.74 mg/dL). Liver Function Tests: Recent Labs  Lab 10/31/19 1453 11/01/19 1425 11/02/19 0623  AST 43* 43* 44*  ALT 61* 53* 46*  ALKPHOS 261* 230* 193*  BILITOT 2.2* 2.2* 1.8*  PROT 7.5 7.0 7.1  ALBUMIN 3.8 3.9 3.8   Recent Labs  Lab 11/01/19 1425  LIPASE 29   No results for input(s): AMMONIA in the last 168 hours. Coagulation profile Recent Labs  Lab 11/01/19 1425  INR 1.2    CBC: Recent Labs  Lab 10/31/19 1453 11/01/19 1425 11/02/19 0623 11/03/19 0901  WBC 3.1* 2.8* 2.2* 1.7*  NEUTROABS 2.4 2.4  --   --   HGB 11.9* 10.5* 10.3* 9.5*  HCT 37.4 32.2* 31.0* 29.9*  MCV 99.2 97.9 98.1 99.0  PLT 192 131* 138* 149*   Cardiac Enzymes: No results for input(s): CKTOTAL, CKMB, CKMBINDEX, TROPONINI in the last 168 hours. BNP (last 3 results) No results for input(s): PROBNP in the last 8760 hours. CBG: No results for input(s): GLUCAP in the last 168 hours. D-Dimer: No results for input(s): DDIMER in the last 72 hours. Hgb A1c: No results for input(s): HGBA1C in the last 72 hours. Lipid Profile: No results for input(s): CHOL, HDL, LDLCALC, TRIG, CHOLHDL, LDLDIRECT in the last 72 hours. Thyroid function studies: No results for input(s): TSH, T4TOTAL, T3FREE, THYROIDAB in the last 72 hours.  Invalid input(s): FREET3 Anemia work up: No results for input(s): VITAMINB12, FOLATE, FERRITIN, TIBC, IRON, RETICCTPCT in the last 72 hours. Sepsis Labs: Recent Labs  Lab 10/31/19 1453 11/01/19 1425 11/02/19 0623 11/03/19 0901  WBC 3.1* 2.8* 2.2* 1.7*    Microbiology Recent Results (from the past 240 hour(s))  Respiratory Panel by RT PCR (Flu A&B, Covid) - Nasopharyngeal Swab     Status: None   Collection Time: 11/01/19  3:53 PM   Specimen: Nasopharyngeal Swab  Result Value Ref Range Status   SARS Coronavirus 2 by RT PCR NEGATIVE NEGATIVE Final    Comment: (NOTE) SARS-CoV-2 target nucleic acids are NOT  DETECTED. The SARS-CoV-2 RNA is generally detectable in upper respiratoy specimens during the acute phase of infection. The lowest concentration of SARS-CoV-2 viral copies this assay can detect is 131 copies/mL. A negative result does not preclude SARS-Cov-2 infection and should not be used as the sole basis for treatment or other patient management decisions. A negative result may occur with  improper specimen collection/handling, submission of specimen other than nasopharyngeal swab, presence of viral mutation(s) within the areas targeted by this assay, and inadequate number of viral copies (<131 copies/mL). A negative result must be combined with  clinical observations, patient history, and epidemiological information. The expected result is Negative. Fact Sheet for Patients:  PinkCheek.be Fact Sheet for Healthcare Providers:  GravelBags.it This test is not yet ap proved or cleared by the [place] FDA and  has been authorized for detection and/or diagnosis of SARS-CoV-2 by FDA under an Emergency Use Authorization (EUA). This EUA will remain  in effect (meaning this test can be used) for the duration of the COVID-19 declaration under Section 564(b)(1) of the Act, 21 U.S.C. section 360bbb-3(b)(1), unless the authorization is terminated or revoked sooner.    Influenza A by PCR NEGATIVE NEGATIVE Final   Influenza B by PCR NEGATIVE NEGATIVE Final    Comment: (NOTE) The Xpert Xpress SARS-CoV-2/FLU/RSV assay is intended as an aid in  the diagnosis of influenza from Nasopharyngeal swab specimens and  should not be used as a sole basis for treatment. Nasal washings and  aspirates are unacceptable for Xpert Xpress SARS-CoV-2/FLU/RSV  testing. Fact Sheet for Patients: PinkCheek.be Fact Sheet for Healthcare Providers: GravelBags.it This test is not yet approved or cleared  by the [place] FDA and  has been authorized for detection and/or diagnosis of SARS-CoV-2 by  FDA under an Emergency Use Authorization (EUA). This EUA will remain  in effect (meaning this test can be used) for the duration of the  Covid-19 declaration under Section 564(b)(1) of the Act, 21  U.S.C. section 360bbb-3(b)(1), unless the authorization is  terminated or revoked. Performed at [facility], [address] [place], [place] [address]     Procedures and diagnostic studies:  DG Abd 1 View  Result Date: 11/02/2019 CLINICAL DATA:  IVC filter present. EXAM: ABDOMEN - 1 VIEW COMPARISON:  Abdominal x-ray dated Feb 20, 2018. FINDINGS: The bowel gas pattern is normal. Oral contrast in the colon. No radio-opaque calculi or other significant radiographic abnormality are seen. Unchanged IVC filter. Calcified fibroids in the pelvis again noted. Unchanged nodular contour of the inferior right liver due to known hepatic metastatic disease. No acute osseous abnormality. IMPRESSION: No acute findings. Electronically Signed   By: Titus Dubin M.D.   On: 11/02/2019 15:37   MR BRAIN W WO CONTRAST  Result Date: 11/02/2019 CLINICAL DATA:  Metastatic breast cancer, follow-up EXAM: MRI HEAD WITHOUT AND WITH CONTRAST TECHNIQUE: Multiplanar, multiecho pulse sequences of the brain and surrounding structures were obtained without and with intravenous contrast. CONTRAST:  34mL GADAVIST GADOBUTROL 1 MMOL/ML IV SOLN COMPARISON:  08/31/2019 FINDINGS: There is motion artifact particularly on postcontrast imaging. Brain: There is suboptimal comparison with the prior study due to artifact. Enhancing lesions do not appear substantially changed. Several of the lesions are not identified, but this is probably secondary to small size in the setting of artifact. There is no definite new lesion. No new edema. There is no acute infarction or intracranial hemorrhage. Confluent areas of T2 hyperintensity in the  supratentorial white matter likely reflects therapy related change. There is no hydrocephalus. Vascular: Major vessel flow voids at the skull base are preserved. Skull and upper cervical spine: Normal marrow signal is preserved. Sinuses/Orbits: Paranasal sinuses are aerated. Orbits are unremarkable. Other: Sella is unremarkable.  Mastoid air cells are clear. IMPRESSION: Suboptimal evaluation and comparison due to motion artifact. However, there is no evidence of new or progressive disease. Electronically Signed   By: Macy Mis M.D.   On: 11/02/2019 12:57   US Venous Img Lower Bilateral  Result Date: 11/01/2019 CLINICAL DATA:  64 year old female with leg pain EXAM: BILATERAL LOWER  EXTREMITY VENOUS DOPPLER ULTRASOUND TECHNIQUE: Gray-scale sonography with graded compression, as well as color Doppler and duplex ultrasound were performed to evaluate the lower extremity deep venous systems from the level of the common femoral vein and including the common femoral, femoral, profunda femoral, popliteal and calf veins including the posterior tibial, peroneal and gastrocnemius veins when visible. The superficial great saphenous vein was also interrogated. Spectral Doppler was utilized to evaluate flow at rest and with distal augmentation maneuvers in the common femoral, femoral and popliteal veins. COMPARISON:  None. FINDINGS: RIGHT LOWER EXTREMITY Common Femoral Vein: Noncompressible. The lumen is expanded and filled with heterogeneous internal echoes. No evidence of flow on color Doppler imaging. Findings are consistent with occlusive thrombus. Saphenofemoral Junction: Occlusive thrombus extends into the proximal saphenofemoral junction. Profunda Femoral Vein: Occlusive thrombus extends into the profunda femoral vein. Femoral Vein: Occlusive thrombus extends throughout the femoral vein in the thigh. Popliteal Vein: Occlusive thrombus extends through the popliteal vein. Calf Veins: Occlusive thrombus extends into the  posterior tibial, peroneal and gastrocnemius veins. Superficial Great Saphenous Vein: Positive thrombus in the small saphenous vein. The great saphenous vein is patent and compressible. Venous Reflux:  None. Other Findings:  None. LEFT LOWER EXTREMITY Common Femoral Vein: Partially compressible. There is some eccentric wall thickening in the left common vein consistent with chronic DVT. Saphenofemoral Junction: Eccentric wall thickening extends into the saphenofemoral junction. Profunda Femoral Vein: Wall thickening extends into the profunda femoral vein. Femoral Vein: Wall thickening extends into the femoral vein. Popliteal Vein: The popliteal vein is only partially compressible. The lumen is expanded and filled with heterogeneous echoes. There is a segment of the vein without evidence of vascular flow on color Doppler consistent with occlusive DVT. Calf Veins: No evidence of thrombus. Normal compressibility and flow on color Doppler imaging. Superficial Great Saphenous Vein: Noncompressible. Echogenic material noted throughout the great saphenous vein. Venous Reflux:  None. Other Findings:  None. IMPRESSION: 1. Positive for extensive occlusive and likely acute DVT throughout the right lower extremity. 2. Positive for what appears to be a combination of acute and chronic DVT in the left lower extremity. There is a relatively short segment of occlusive and likely acute DVT in the popliteal vein with findings more consistent with chronic, or nonocclusive DVT in the remaining venous structures. Electronically Signed   By: Jacqulynn Cadet M.D.   On: 11/01/2019 14:36    Medications:   . cetirizine  10 mg Oral Daily  . Chlorhexidine Gluconate Cloth  6 each Topical Daily  . flecainide  50 mg Oral BID  . fluconazole  200 mg Oral Daily  . ipratropium  2 spray Each Nare BID  . magnesium oxide  400 mg Oral BID  . metoprolol tartrate  25 mg Oral BID  . montelukast  10 mg Oral QHS  . pregabalin  100 mg Oral BID     Continuous Infusions: . sodium chloride 75 mL/hr at 11/03/19 0047     LOS: 2 days   Alecsander Hattabaugh  [facility]     11/03/2019, 11:40 AM

## 2019-11-03 NOTE — Plan of Care (Addendum)
Dr. Ree Kida of consistent high HR 1Teens to 130s in past 24 hours.  This has moved patient in/out of yellow mews. Patient continues to be lethargic and weak and is not able to take PO meds at this time.    No further orders at this time.  9450  Dr. Ree Kida back from OR and heparin drip re-started at 7.5 per GI verbal orders.  Also that HR is still maintaining in 1teens or higher.

## 2019-11-03 NOTE — Progress Notes (Signed)
[place] for heparin Indication: DVT  Allergies  Allergen Reactions  . No Known Allergies    Patient Measurements: Height: 5\' 6"  (167.6 cm) Weight: 169 lb (76.7 kg) IBW/kg (Calculated) : 59.3 Heparin Dosing Weight: 72.6 kg  Vital Signs: Temp: 97.8 F (36.6 C) (02/05 1618) Temp Source: Temporal (02/05 1618) BP: 100/67 (02/05 1844) Pulse Rate: 114 (02/05 1844)  Labs: Recent Labs    11/01/19 1425 11/01/19 2214 11/02/19 0623 11/02/19 0623 11/02/19 1558 11/02/19 2148 11/03/19 0901  HGB 10.5*  --  10.3*  --   --   --  9.5*  HCT 32.2*  --  31.0*  --   --   --  29.9*  PLT 131*  --  138*  --   --   --  149*  APTT 52*  --   --   --   --   --   --   LABPROT 14.9  --   --   --   --   --   --   INR 1.2  --   --   --   --   --   --   HEPARINUNFRC  --    < > 1.16*   < > 0.35 0.34 0.16*  CREATININE 1.27*  --  0.98  --   --   --  0.74   < > = values in this interval not displayed.    Estimated Creatinine Clearance: 73.4 mL/min (by C-G formula based on SCr of 0.74 mg/dL).   Assessment: 64 year old female presented with increased weakness and fall. Patient receiving chemotherapy for breast cancer with mets to liver and brain. Ultrasound lower extremities positive for extensive occlusive and likely acute DVT throughout right lower extremity as well as what appears to be a combination of acute and chronic DVT in the left lower extremity. Pharmacy consulted for heparin drip for VTE treatment.  No PTA anticoagulant of record.  Started with heparin 4000 unit bolus followed by heparin drip at 1100 units/hr  02/03 2214 HL 1.61 (held for 1 hour and reduced rate to 900 u/hr) 02/04 6384 HL 1.16 (held for 1 hour and reduced rate to 750 u/hr) 02/04 1558 HL 0.35, therapeutic 02/04 2148 HL 0.34, therapeutic x 2 02/05 0902 HL 0.16, Heparin had been stopped at 05:30   Goal of Therapy:  Heparin level 0.3-0.7 units/ml Monitor platelets by anticoagulation  protocol: Yes   Plan:  2/5 AM Heparin drip was stopped inadvertantly at 05:30. Resumed at previous rate of 750 units/hr at 08:30. Heparin to be stopped at 10:30 for EGD (needs to be off for 4 hrs prior). Will resume Heparin after procedure.  CBC daily while on Heparin.  2/5 PM follow up: Messaged hospitalist around 1800 (before shift change) who stated heparin can be resumed now.  Will enter order to resume heparin drip at previous rate of 750 units/hr (=7.5 ml/hr).  HL in 6h after restart and CBC in AM.  Pharmacy will continue to follow.   Rayna Sexton, PharmD, BCPS Clinical Pharmacist 11/03/2019 6:53 PM

## 2019-11-03 NOTE — Progress Notes (Signed)
Pt scheduled for EGD today, pt with heparin drip that was noted to be stopped at 0530; Dr. Mal Misty made aware, ordered to follow protocol for restart of drip. Spoke with Endo, procedure 230p today, as per Gertie Fey note, hold heparin 4 hours prior to procedure, Endorsed to next shift to hold heparin at 1030A; [name] with pharmacy to restart heparin at this time.

## 2019-11-03 NOTE — Progress Notes (Signed)
Pt heart rate increased 120's Pt combative, not allowing AM labs to be drawn. States "get away or I'll slap you" Rufina Falco NP aware, Pt to receive AM meds for HR and BP; will endorse to next shift to collect am labs as patient not cooperative at time of lab draw

## 2019-11-03 NOTE — Evaluation (Addendum)
Physical Therapy Evaluation Patient Details Name: Sarah Horne MRN: [id number] DOB: 1956-08-15 Today's Date: 11/03/2019   History of Present Illness  Sarah Horne is a 38yoF who comes to [facility] 11/01/19 c generalized weakness and RLE pain. She has not been able to walk since yesterday because of severe right leg pain. Swelling has progressively worsened. She was found to be dehydrated so she was given 1 L of Ringer's lactate in the ED.  She was also given IV fentanyl for pain.  Venous duplex showed extensive DVT in the right lower extremity. Vascular consult 2/4 reports, 'unfortunately due to the presence of brain metastasis we are unable to offer thrombolysis /thrombectomy.' PMH: PE (2018), Herceptin-induced cardiomyopathy, PSVT, sinus tachycardia, chemotherapy-induced PND, epistaxis, CAD (MI 1998), stage IV left breast CA c mets to liver and brain and T12 vertebra, hypertension, HLD.  Clinical Impression  Pt admitted with above diagnosis. Pt currently with functional limitations due to the deficits listed below (see "PT Problem List"). Upon entry, pt in bed, asleep but awakens easily and is agreeable to participate. The pt is confused and oriented to self, but has several tangential verbal replies in session. Pt reports having RW at home, but not typically using or needing it. Pt denies leg pain at this time, no noted grimacing or antalgic motor planning during bed mobility or transfers. MinA required for bed mobility, min-modA for transfers, pt struggles to take a few small steps at bedside. After basic mobility, noted HR 135bpm, SpO2 98%, RN already aware. Functional mobility assessment demonstrates increased effort/time requirements, poor tolerance, and need for physical assistance, whereas the patient performed these at a higher level of independence PTA. Pt having significant cognitive impairment and limited safety awareness, would be unsafe to return to home alone. Pt typically able to AMB with  modified independence, too weak this date to remain standing for >30sec. Pt will benefit from skilled PT intervention to increase independence and safety with basic mobility in preparation for discharge to the venue listed below.       Follow Up Recommendations Supervision for mobility/OOB;Supervision/Assistance - 24 hour;SNF    Equipment Recommendations  None recommended by PT    Recommendations for Other Services       Precautions / Restrictions Precautions Precautions: Fall Restrictions Weight Bearing Restrictions: No      Mobility  Bed Mobility Overal bed mobility: Needs Assistance Bed Mobility: Supine to Sit;Sit to Supine     Supine to sit: Min assist Sit to supine: Min assist   General bed mobility comments: heavy effort, cues for safety and to attempt to task.  Transfers Overall transfer level: Needs assistance Equipment used: 1 person hand held assist Transfers: Sit to/from Stand Sit to Stand: Min assist         General transfer comment: heavy effort, quite, weak, no apparent leg pain limiting.  Ambulation/Gait Ambulation/Gait assistance: Min assist Gait Distance (Feet): 1 Feet Assistive device: 1 person hand held assist       General Gait Details: Pt given UE support for side stepping at bedside, difficulty stepping with quick return to sitting EOB. HR 134, SpO2 98%  Stairs            Wheelchair Mobility    Modified Rankin (Stroke Patients Only)       Balance  Pertinent Vitals/Pain Pain Assessment: No/denies pain    Home Living Family/patient expects to be discharged to:: Private residence Living Arrangements: Alone Available Help at Discharge: Family;Available PRN/intermittently Type of Home: House Home Access: Stairs to enter Entrance Stairs-Rails: Right Entrance Stairs-Number of Steps: 3 Home Layout: One level Home Equipment: Walker - 2 wheels;Bedside  commode;Shower seat      Prior Function Level of Independence: Independent with assistive device(s)         Comments: Uses RW on occasion for longer distances;  pt mostly household ambulator.     Hand Dominance   Dominant Hand: Right    Extremity/Trunk Assessment   Upper Extremity Assessment Upper Extremity Assessment: Generalized weakness    Lower Extremity Assessment Lower Extremity Assessment: Generalized weakness       Communication   Communication: No difficulties  Cognition Arousal/Alertness: Lethargic   Overall Cognitive Status: Impaired/Different from baseline Area of Impairment: Orientation;Following commands;Safety/judgement;Problem solving                 Orientation Level: Person     Following Commands: Follows one step commands consistently;Follows one step commands inconsistently;Follows one step commands with increased time Safety/Judgement: Decreased awareness of deficits;Decreased awareness of safety   Problem Solving: Slow processing;Requires verbal cues;Difficulty sequencing        General Comments      Exercises     Assessment/Plan    PT Assessment Patient needs continued PT services  PT Problem List Decreased cognition;Decreased strength;Decreased range of motion;Decreased activity tolerance;Decreased balance;Decreased mobility;Decreased knowledge of precautions;Decreased safety awareness       PT Treatment Interventions DME instruction;Balance training;Gait training;Stair training;Functional mobility training;Therapeutic activities;Therapeutic exercise;Patient/family education    PT Goals (Current goals can be found in the Care Plan section)  Acute Rehab PT Goals PT Goal Formulation: Patient unable to participate in goal setting Time For Goal Achievement: 11/17/19 Potential to Achieve Goals: Fair    Frequency Min 2X/week   Barriers to discharge Inaccessible home environment;Decreased caregiver support       Co-evaluation               AM-PAC PT "6 Clicks" Mobility  Outcome Measure Help needed turning from your back to your side while in a flat bed without using bedrails?: A Little Help needed moving from lying on your back to sitting on the side of a flat bed without using bedrails?: A Little Help needed moving to and from a bed to a chair (including a wheelchair)?: A Little Help needed standing up from a chair using your arms (e.g., wheelchair or bedside chair)?: A Little Help needed to walk in hospital room?: A Lot Help needed climbing 3-5 steps with a railing? : A Lot 6 Click Score: 16    End of Session   Activity Tolerance: Patient limited by fatigue;No increased pain;Treatment limited secondary to medical complications (Comment)(elevated HR) Patient left: in bed;with nursing/sitter in room Nurse Communication: Mobility status PT Visit Diagnosis: Unsteadiness on feet (R26.81);Other abnormalities of gait and mobility (R26.89);Muscle weakness (generalized) (M62.81);History of falling (Z91.81);Difficulty in walking, not elsewhere classified (R26.2)    Time: 4709-6283 PT Time Calculation (min) (ACUTE ONLY): 11 min   Charges:   PT Evaluation $PT Eval Low Complexity: 1 Low          2:33 PM, 11/03/19 Etta Grandchild, PT, DPT Physical Therapist - [facility]  [phone] ([place])    [place] C 11/03/2019, 2:29 PM

## 2019-11-03 NOTE — Progress Notes (Signed)
Pt MEWS 2; Pt on Cardiac monitor, ST, 110-115's; Pt denies pain, pt afebrile, Rufina Falco, NP aware, No further orders received.

## 2019-11-03 NOTE — Anesthesia Preprocedure Evaluation (Addendum)
Anesthesia Evaluation  Patient identified by MRN, date of birth, ID band Patient awake    Reviewed: Allergy & Precautions, H&P , NPO status , Patient's Chart, lab work & pertinent test results, reviewed documented beta blocker date and time   Airway Mallampati: II   Neck ROM: full    Dental  (+) Poor Dentition   Pulmonary neg pulmonary ROS, neg shortness of breath, Current Smoker,    Pulmonary exam normal        Cardiovascular Exercise Tolerance: Poor hypertension, On Medications + Past MI (1998), + Peripheral Vascular Disease and +CHF (chemotherapy-induced cardiomyopathy)  negative cardio ROS Normal cardiovascular exam Rhythm:regular Rate:Normal  H/o PE.  Presented with LE DVT, being treated with heparin  Echo 2018 showed grade 1 diastolic dysfunction with normal EF, no recent echo available   Neuro/Psych Chemotherapy-induced peripheral neuropathy  Neuromuscular disease negative psych ROS   GI/Hepatic negative GI ROS, Neg liver ROS, PUD, dysphagia   Endo/Other  negative endocrine ROS  Renal/GU negative Renal ROS  negative genitourinary   Musculoskeletal   Abdominal   Peds  Hematology negative hematology ROS (+) Blood dyscrasia, anemia ,   Anesthesia Other Findings Past Medical History: No date: Chemotherapy-induced peripheral neuropathy (HCC) No date: Epistaxis     Comment:  a. 11/2016 in setting of asa/plavix-->silver nitrate               cauterization. No date: GI bleed     Comment:  a. 11/2016 Admission w/ GIB and hypovolemic shock req 3u               PRBC's;  b. 11/2016 ECG: gastritis & nonbleeding peptic               ulcer; c. 11/2016 Conlonoscopy: rectal and sigmoid colonic              ulcers. No date: Heart attack [facility])     Comment:  a. 1998 Cath @ [facility]: reportedly no intervention required. No date: Herceptin-induced cardiomyopathy ([place])     Comment:  a. In the setting of Herceptin Rx for breast  cancer               (initiated 12/2014); b. 03/2015 MUGA EF 64%; b. 08/2015               MUGA: EF 51%; c. 10/2015 MUGA: EF 44%; d. 11/2015 Echo: EF               45-50%; e. 01/2016 MUGA: EF 60%; f. 06/2016 MUGA EF 65%;               g. 10/2016 MUGA: EF 61%;  h. 12/2016 Echo: EF 55-60%, gr1               DD. No date: Hyperlipidemia No date: Hypertension No date: Neuropathy No date: Possible PAD (peripheral artery disease) ([place])     Comment:  a. 11/2016 LE cyanosis and weak pulses-->CTA w/o               significant Ao-BiFem dzs. ? distal dzs-->ASA/Plavix               initiated by vascular surgery. No date: PSVT (paroxysmal supraventricular tachycardia) ([place])     Comment:  a. Dx 11/2016. No date: Pulmonary embolism [facility])     Comment:  a. 12/2016 CTA Chest: small nonocclusive PE in inferior               segment of the Left  lingula, somewhat eccentric filling               defect suggesting chronic rather than acute embolic               event; b. 12/2016 LE U/S:  No DVT; c. 12/2016 Echo: Nl RV               fxn, nl PASP. No date: Recurrent Metastatic breast cancer ([place])     Comment:  a. Dx 2016: Stage II, ER positive, PR positive,               HER-2/neu overexpressing of the left               breast-->chemo/radiation; b. 10/2016 CT Abd/pelvis:               diffuse liver mets, ill defined sclerotic bone               lesions-T12;  c. 10/2016 MRI brain: metastatic lesion               along L temporal lobe (19x50m) w/ extensive surrounding               edema & 548mmidline shift to right. [place]  Past Surgical History: 2016: BREAST BIOPSY; Left     Comment:  Positive 05/23/2015: BREAST LUMPECTOMY WITH SENTINEL LYMPH NODE BIOPSY; Left     Comment:  Procedure: LEFT BREAST WIDE EXCISION WITH AXILLARY               DISSECTION, MASTOPLASTY ;  Surgeon: JeRobert Bellow              MD;  Location: [facility] ORS;  Service: General;  Laterality:                Left; 12/18/14: BREAST SURGERY; Left     Comment:  breast biopsy/INVASIVE DUCTAL CARCINOMA OF BREAST,               [place] GRADE 2. 05/23/2015.: BREAST SURGERY     Comment:  Wide excision/mastoplasty, axillary dissection. No               residual invasive cancer, positive for residual DCIS. 0/2              nodes identified on axillary dissection. (no SLN by               technetium or methylene blue) No date: CARDIAC CATHETERIZATION 12/10/2016: COLONOSCOPY WITH PROPOFOL; N/A     Comment:  Procedure: COLONOSCOPY WITH PROPOFOL;  Surgeon: DaLucilla LameMD;  Location: [facility] ENDOSCOPY;  Service: Endoscopy;              Laterality: N/A; 02/13/2017: COLONOSCOPY WITH PROPOFOL; N/A     Comment:  Procedure: COLONOSCOPY WITH PROPOFOL;  Surgeon: WoLucilla LameMD;  Location: [facility] ENDOSCOPY;  Service:               Endoscopy;  Laterality: N/A; 12/08/2016: ESOPHAGOGASTRODUODENOSCOPY (EGD) WITH PROPOFOL; N/A     Comment:  Procedure: ESOPHAGOGASTRODUODENOSCOPY (EGD) WITH               PROPOFOL;  Surgeon: DaLucilla LameMD;  [facility];  Service: Endoscopy;  Laterality: N/A; 02/15/2017: IVC FILTER INSERTION; N/A     Comment:  Procedure: IVC Filter Insertion;  Surgeon: Algernon Huxley,              MD;  Location: [facility];  Service:               Cardiovascular;  Laterality: N/A; 12-31-14: PORTACATH PLACEMENT; Right     Comment:  Dr Bary Castilla  BMI    Body Mass Index: 26.22 kg/m      Reproductive/Obstetrics negative OB ROS                            Anesthesia Physical Anesthesia Plan  ASA: IV and emergent  Anesthesia Plan: General   Post-op Pain Management:    Induction:   PONV Risk Score and Plan: Propofol infusion and TIVA  Airway Management Planned:   Additional Equipment:   Intra-op Plan:   Post-operative Plan:   Informed Consent: I have reviewed the patients History and Physical, chart, labs  and discussed the procedure including the risks, benefits and alternatives for the proposed anesthesia with the patient or authorized representative who has indicated his/her understanding and acceptance.     Dental Advisory Given  Plan Discussed with: Anesthesiologist  Anesthesia Plan Comments:        Anesthesia Quick Evaluation

## 2019-11-04 LAB — CBC
HCT: 25 % — ABNORMAL LOW (ref 36.0–46.0)
HCT: 27.4 % — ABNORMAL LOW (ref 36.0–46.0)
Hemoglobin: 8.3 g/dL — ABNORMAL LOW (ref 12.0–15.0)
Hemoglobin: 8.9 g/dL — ABNORMAL LOW (ref 12.0–15.0)
MCH: 33.3 pg (ref 26.0–34.0)
MCH: 32.7 pg (ref 26.0–34.0)
MCHC: 33.2 g/dL (ref 30.0–36.0)
MCHC: 32.5 g/dL (ref 30.0–36.0)
MCV: 100.4 fL — ABNORMAL HIGH (ref 80.0–100.0)
MCV: 100.7 fL — ABNORMAL HIGH (ref 80.0–100.0)
Platelets: 128 10*3/uL — ABNORMAL LOW (ref 150–400)
Platelets: 140 10*3/uL — ABNORMAL LOW (ref 150–400)
RBC: 2.49 MIL/uL — ABNORMAL LOW (ref 3.87–5.11)
RBC: 2.72 MIL/uL — ABNORMAL LOW (ref 3.87–5.11)
RDW: 18.6 % — ABNORMAL HIGH (ref 11.5–15.5)
RDW: 19.3 % — ABNORMAL HIGH (ref 11.5–15.5)
WBC: 1.3 10*3/uL — CL (ref 4.0–10.5)
WBC: 1.5 10*3/uL — ABNORMAL LOW (ref 4.0–10.5)
nRBC: 1.5 % — ABNORMAL HIGH (ref 0.0–0.2)
nRBC: 0 % (ref 0.0–0.2)

## 2019-11-04 LAB — HEPARIN LEVEL (UNFRACTIONATED): Heparin Unfractionated: 0.17 IU/mL — ABNORMAL LOW (ref 0.30–0.70)

## 2019-11-04 MED ORDER — APIXABAN 5 MG PO TABS: 5.0000 mg | ORAL_TABLET | Freq: Two times a day (BID) | ORAL | Status: DC

## 2019-11-04 MED ORDER — APIXABAN 5 MG PO TABS
10.0000 mg | ORAL_TABLET | Freq: Two times a day (BID) | ORAL | Status: DC
  Administered 2019-11-04 – 2019-11-07 (×7): 10 mg via ORAL
  Filled 2019-11-04 (×7): qty 2

## 2019-11-04 MED ORDER — HEPARIN BOLUS VIA INFUSION
1000.0000 [IU] | Freq: Once | INTRAVENOUS | Status: AC
  Administered 2019-11-04: 03:00:00 1000 [IU] via INTRAVENOUS
  Filled 2019-11-04: qty 1000

## 2019-11-04 NOTE — Progress Notes (Signed)
Patient alert and oriented and able to hold a conservation while recalling short and long term information. I witnessed with Dr. Mal Misty the discussion of code status with the patient. Patient states multiple times that she would like to be a full code and that she does remember completing the other form but no longer wishes to use that.

## 2019-11-04 NOTE — Progress Notes (Signed)
OT Cancellation Note  Patient Details Name: Sarah Horne MRN: [id number] DOB: October 19, 1955   Cancelled Treatment:    Reason Eval/Treat Not Completed: Medical issues which prohibited therapy  Per NSG pt still with tachycardia over night and today and  had to hold some of medication. To hold off therapy.    Jaylean Buenaventura, MaureenOTR/L,CLT 11/04/2019, 10:47 AM

## 2019-11-04 NOTE — Progress Notes (Signed)
Progress Note    VASHON ARCH  DVV:[id number] DOB: 1956-06-23  DOA: 11/01/2019 PCP: Delsa Grana, PA-C      Brief Narrative:    Medical records reviewed and are as summarized below:  Sarah Horne is an 64 y.o. female with medical history significant for pulmonary embolism in 2018, Herceptin-induced cardiomyopathy, PSVT, sinus tachycardia, chemotherapy-induced peripheral neuropathy, epistaxis, CAD (heart attack in 1998), stage IV left breast cancer with metastasis to the liver, brain, T12 vertebra, hypertension, hyperlipidemia.  She presented to the hospital because of generalized weakness, right leg pain, right lower extremity swelling and difficulty walking.Marland Kitchen  She was found to have extensive DVT of the right lower extremity.      Assessment/Plan:   Principal Problem:   Acute deep vein thrombosis (DVT) of femoral vein of right lower extremity (HCC) Active Problems:   Carcinoma of upper-outer quadrant of left breast in female, estrogen receptor positive ([place])   Peripheral neuropathy due to chemotherapy [facility])   Acute extensive DVT of right lower extremity/?  Acute on chronic DVT in the left lower extremity: IV heparin has been changed to Eliquis.  Plan for thrombectomy was aborted because of brain metastasis.  Patient already has an IVC filter in place (placed in May 2018).  PT and OT   Hyponatremia/dehydration/elevated creatinine: Improved.   Chronic sinus tachycardia/history of PSVT: Continue metoprolol and flecainide.  Left breast cancer with metastasis to the liver, brain and T12: Follow-up with oncologist as an outpatient.  Dysphagia/cervical esophageal web recent thrush: Status post EGD on 11/02/2019 with dilation of cervical esophageal web.  Continue fluconazole (to be completed on 11/09/2019).  Peripheral neuropathy: Continue Lyrica  Elevated liver enzymes is likely due to liver metastasis.  Body mass index is 27.28 kg/m.   Family  Communication/Anticipated D/C date and plan/Code Status   DVT prophylaxis: IV heparin Code Status: Full code.  It is worth noting that on admission, patient requested to be a full code while she was in the emergency room.  Apparently, she had previously signed a MOST form and was DNR prior to admission.  Recommendations from palliative care physician, Dr. Hilma Favors, was noted today.  However, previous DNR request was not consistent with her active full CODE STATUS.  I went back to see the patient for the second time but this time I went with her nurse, [place].  CODE STATUS was discussed again.  Patient insisted that she wanted to remain a full code.  She said she remembered signing a MOST form and requesting to be a DNR in the past but she has changed her mind.  She was completely oriented to person, place, time and situation.  She was able to recollect recent and past events. She asked intelligent questions and all her questions were answered.  Family Communication: Plan discussed with the patient Disposition Plan: Possible discharge to SNF in 2 to 3 days pending insurance authorization      Subjective:   No complaints.  Right leg pain has improved.  Objective:    Vitals:   11/04/19 0018 11/04/19 0449 11/04/19 0844 11/04/19 1255  BP: 102/70 114/72 117/73 116/76  Pulse: (!) 109 (!) 105 (!) 109 (!) 105  Resp: 17 16    Temp: 98 F (36.7 C) 98.7 F (37.1 C) 98.5 F (36.9 C) 98.2 F (36.8 C)  TempSrc: Oral Oral Oral Oral  SpO2: 100% 100% 100% 100%  Weight:      Height:  Intake/Output Summary (Last 24 hours) at 11/04/2019 1458 Last data filed at 11/04/2019 1040 Gross per 24 hour  Intake 1591.22 ml  Output --  Net 1591.22 ml   Filed Weights   11/01/19 1249 11/01/19 1820 11/03/19 1618  Weight: 72.6 kg 73.7 kg 76.7 kg    Exam:  GEN: NAD SKIN: No rash EYES: No pallor or icterus  ENT: MMM CV: RRR PULM: Clear to auscultation bilaterally ABD: soft, ND, NT, +BS CNS: Alert,  oriented to person, place, time and situation.  Nonfocal EXT: Significant swelling of entire right lower extremity from thigh down to the foot.  No erythema or tenderness.   Data Reviewed:   I have personally reviewed following labs and imaging studies:  Labs: Labs show the following:   Basic Metabolic Panel: Recent Labs  Lab 10/31/19 1453 10/31/19 1453 11/01/19 1425 11/01/19 1425 11/02/19 0623 11/03/19 0901  NA 133*  --  132*  --  136 136  K 4.6   < > 4.8   < > 4.5 4.2  CL 97*  --  97*  --  101 101  CO2 22  --  25  --  25 21*  GLUCOSE 119*  --  118*  --  90 78  BUN 27*  --  31*  --  28* 23  CREATININE 1.23*  --  1.27*  --  0.98 0.74  CALCIUM 9.1  --  9.1  --  8.9 8.5*  MG  --   --  2.7*  --   --   --    < > = values in this interval not displayed.   GFR Estimated Creatinine Clearance: 73.4 mL/min (by C-G formula based on SCr of 0.74 mg/dL). Liver Function Tests: Recent Labs  Lab 10/31/19 1453 11/01/19 1425 11/02/19 0623  AST 43* 43* 44*  ALT 61* 53* 46*  ALKPHOS 261* 230* 193*  BILITOT 2.2* 2.2* 1.8*  PROT 7.5 7.0 7.1  ALBUMIN 3.8 3.9 3.8   Recent Labs  Lab 11/01/19 1425  LIPASE 29   No results for input(s): AMMONIA in the last 168 hours. Coagulation profile Recent Labs  Lab 11/01/19 1425  INR 1.2    CBC: Recent Labs  Lab 10/31/19 1453 10/31/19 1453 11/01/19 1425 11/02/19 0623 11/03/19 0901 11/04/19 0204 11/04/19 0908  WBC 3.1*   < > 2.8* 2.2* 1.7* 1.3* 1.5*  NEUTROABS 2.4  --  2.4  --   --   --   --   HGB 11.9*   < > 10.5* 10.3* 9.5* 8.3* 8.9*  HCT 37.4   < > 32.2* 31.0* 29.9* 25.0* 27.4*  MCV 99.2   < > 97.9 98.1 99.0 100.4* 100.7*  PLT 192   < > 131* 138* 149* 128* 140*   < > = values in this interval not displayed.   Cardiac Enzymes: No results for input(s): CKTOTAL, CKMB, CKMBINDEX, TROPONINI in the last 168 hours. BNP (last 3 results) No results for input(s): PROBNP in the last 8760 hours. CBG: No results for input(s): GLUCAP in  the last 168 hours. D-Dimer: No results for input(s): DDIMER in the last 72 hours. Hgb A1c: No results for input(s): HGBA1C in the last 72 hours. Lipid Profile: No results for input(s): CHOL, HDL, LDLCALC, TRIG, CHOLHDL, LDLDIRECT in the last 72 hours. Thyroid function studies: No results for input(s): TSH, T4TOTAL, T3FREE, THYROIDAB in the last 72 hours.  Invalid input(s): FREET3 Anemia work up: No results for input(s): VITAMINB12, FOLATE, FERRITIN, TIBC, IRON,  RETICCTPCT in the last 72 hours. Sepsis Labs: Recent Labs  Lab 11/02/19 0623 11/03/19 0901 11/04/19 0204 11/04/19 0908  WBC 2.2* 1.7* 1.3* 1.5*    Microbiology Recent Results (from the past 240 hour(s))  Respiratory Panel by RT PCR (Flu A&B, Covid) - Nasopharyngeal Swab     Status: None   Collection Time: 11/01/19  3:53 PM   Specimen: Nasopharyngeal Swab  Result Value Ref Range Status   SARS Coronavirus 2 by RT PCR NEGATIVE NEGATIVE Final    Comment: (NOTE) SARS-CoV-2 target nucleic acids are NOT DETECTED. The SARS-CoV-2 RNA is generally detectable in upper respiratoy specimens during the acute phase of infection. The lowest concentration of SARS-CoV-2 viral copies this assay can detect is 131 copies/mL. A negative result does not preclude SARS-Cov-2 infection and should not be used as the sole basis for treatment or other patient management decisions. A negative result may occur with  improper specimen collection/handling, submission of specimen other than nasopharyngeal swab, presence of viral mutation(s) within the areas targeted by this assay, and inadequate number of viral copies (<131 copies/mL). A negative result must be combined with clinical observations, patient history, and epidemiological information. The expected result is Negative. Fact Sheet for Patients:  PinkCheek.be Fact Sheet for Healthcare Providers:  GravelBags.it This test is not yet  ap proved or cleared by the [place] FDA and  has been authorized for detection and/or diagnosis of SARS-CoV-2 by FDA under an Emergency Use Authorization (EUA). This EUA will remain  in effect (meaning this test can be used) for the duration of the COVID-19 declaration under Section 564(b)(1) of the Act, 21 U.S.C. section 360bbb-3(b)(1), unless the authorization is terminated or revoked sooner.    Influenza A by PCR NEGATIVE NEGATIVE Final   Influenza B by PCR NEGATIVE NEGATIVE Final    Comment: (NOTE) The Xpert Xpress SARS-CoV-2/FLU/RSV assay is intended as an aid in  the diagnosis of influenza from Nasopharyngeal swab specimens and  should not be used as a sole basis for treatment. Nasal washings and  aspirates are unacceptable for Xpert Xpress SARS-CoV-2/FLU/RSV  testing. Fact Sheet for Patients: PinkCheek.be Fact Sheet for Healthcare Providers: GravelBags.it This test is not yet approved or cleared by the [place] FDA and  has been authorized for detection and/or diagnosis of SARS-CoV-2 by  FDA under an Emergency Use Authorization (EUA). This EUA will remain  in effect (meaning this test can be used) for the duration of the  Covid-19 declaration under Section 564(b)(1) of the Act, 21  U.S.C. section 360bbb-3(b)(1), unless the authorization is  terminated or revoked. Performed at [facility] [address] [place], [place] [address]     Procedures and diagnostic studies:  DG Abd 1 View  Result Date: 11/02/2019 CLINICAL DATA:  IVC filter present. EXAM: ABDOMEN - 1 VIEW COMPARISON:  Abdominal x-ray dated Feb 20, 2018. FINDINGS: The bowel gas pattern is normal. Oral contrast in the colon. No radio-opaque calculi or other significant radiographic abnormality are seen. Unchanged IVC filter. Calcified fibroids in the pelvis again noted. Unchanged nodular contour of the inferior right liver due to known  hepatic metastatic disease. No acute osseous abnormality. IMPRESSION: No acute findings. Electronically Signed   By: Titus Dubin M.D.   On: 11/02/2019 15:37    Medications:   . apixaban  10 mg Oral BID  . [START ON 11/11/2019] apixaban  5 mg Oral BID  . cetirizine  10 mg Oral Daily  . Chlorhexidine Gluconate Cloth  6  each Topical Daily  . flecainide  50 mg Oral BID  . fluconazole  200 mg Oral Daily  . ipratropium  2 spray Each Nare BID  . magnesium oxide  400 mg Oral BID  . metoprolol tartrate  25 mg Oral BID  . montelukast  10 mg Oral QHS  . pregabalin  100 mg Oral BID   Continuous Infusions:    LOS: 3 days   Carlisa Eble  [facility]     11/04/2019, 2:58 PM

## 2019-11-04 NOTE — Consult Note (Signed)
Palliative Care Plan of Care Documentation  Patient is followed by outpatient palliative care services in the [place]. She has advanced metastatic breast cancer with complications related to cancer progression and side effects of chemotherapy including dehydration, peripheral neuropathy, severe fatigue,dysphagia due to upper esophageal web, weight loss and oropharyngeal thrush. She is now admitted with acute DVT and mental status changes.   She has an active MOST form completed 06/2019 on her chart in the ACP tab which indicates DNR and LIMITED interventions including avoid ICU, no feeding tube. These are not reflected in the H&P and there is no documented discussion to indicate her preferences for care have changed and I recommend placing a DNR order for this acute admission and when her mental status is clear confirm understanding of the MOST form.  Recommendations:  1. DNR, MOST FORM ON FILE (patient with mental status changes so form should be honored) 2. Will notify Billey Chang, NP with [facility] of her admission and will see her on 2/8. 3. Would dose and titrate up opioids as needed based on her reported pain or distress- Oxycodone 5-10mg  q4 prn. 4. Please call with any needs or questions [phone].  Lane Hacker, DO Palliative Medicine

## 2019-11-04 NOTE — Anesthesia Postprocedure Evaluation (Signed)
Anesthesia Post Note  Patient: Sarah Horne  Procedure(s) Performed: ESOPHAGOGASTRODUODENOSCOPY (EGD) (N/A )  Patient location during evaluation: PACU Anesthesia Type: General Level of consciousness: awake and alert Pain management: pain level controlled Vital Signs Assessment: post-procedure vital signs reviewed and stable Respiratory status: spontaneous breathing, nonlabored ventilation, respiratory function stable and patient connected to nasal cannula oxygen Cardiovascular status: blood pressure returned to baseline and stable Postop Assessment: no apparent nausea or vomiting Anesthetic complications: no     Last Vitals:  Vitals:   11/03/19 2338 11/04/19 0018  BP: 110/75 102/70  Pulse: 99 (!) 109  Resp: 16 17  Temp: 36.8 C 36.7 C  SpO2: 100% 100%    Last Pain:  Vitals:   11/04/19 0018  TempSrc: Oral  PainSc:                  [name]

## 2019-11-04 NOTE — Progress Notes (Signed)
Ch visited pt. on 1A after RN referral; RN noticed change in pt. code status and thought chaplain visit might be helpful; [place] suggested pt. might benefit from Goals of Care being documented in chart.  Pt. lying in bed, room dark, when [place] entered.  Pt. shared she came to [facility] and was in ICU due to pain she had had in her leg.  She said she'd been out in her yard burning trash and fell while coming back to her house; though she 'felt fine' and was able to carry on her usual tasks that evening, the next day she said she was in severe pain.  In extended visit, pt. discussed her health history of breast cancer that spread to her liver and brain and lung; she said that she'd gone 'from a wheelchair to a walker to a cane' and now can walk 'on these two feet here'; pt. expressed some concern that doctors might let her die 'before it's my time to go.'  Terry mentioned goals of care document and pt. was agreeable to answering questions; pt.'s answers are recorded in ACP section (Goals of Care form).  Pt. would like her brother (and specifically not her son, in order not to burden him) to be MPOA.  Ch unsure if this has been officially noted in an AD.  Pt. requested prayer towards end of visit.  Pt. also mentioned wanting a new recliner (hers appears to be over 44yrs old) and a health alert button in case she falls; she believes these are not within her financial means, but [place] offered to discuss these matters w/case managers.  No further needs expressed at this time.         11/04/19 1700  Clinical Encounter Type  Visited With Patient  Visit Type Initial;Psychological support;Spiritual support;Social support  Referral From Nurse  Consult/Referral To Social work;Chaplain  Spiritual Encounters  Spiritual Needs Prayer;Emotional  Stress Factors  Patient Stress Factors Health changes;Major life changes

## 2019-11-04 NOTE — Progress Notes (Addendum)
[place] for heparin Indication: DVT  Allergies  Allergen Reactions  . No Known Allergies    Patient Measurements: Height: 5\' 6"  (167.6 cm) Weight: 169 lb (76.7 kg) IBW/kg (Calculated) : 59.3 Heparin Dosing Weight: 72.6 kg  Vital Signs: Temp: 98 F (36.7 C) (02/06 0018) Temp Source: Oral (02/06 0018) BP: 102/70 (02/06 0018) Pulse Rate: 109 (02/06 0018)  Labs: Recent Labs    11/01/19 1425 11/01/19 2214 11/02/19 5638 11/02/19 0623 11/02/19 1558 11/02/19 2148 11/03/19 0901 11/04/19 0204  HGB 10.5*  --  10.3*   < >  --   --  9.5* 8.3*  HCT 32.2*  --  31.0*  --   --   --  29.9* 25.0*  PLT 131*  --  138*  --   --   --  149* 128*  APTT 52*  --   --   --   --   --   --   --   LABPROT 14.9  --   --   --   --   --   --   --   INR 1.2  --   --   --   --   --   --   --   HEPARINUNFRC  --    < > 1.16*  --    < > 0.34 0.16* 0.17*  CREATININE 1.27*  --  0.98  --   --   --  0.74  --    < > = values in this interval not displayed.    Estimated Creatinine Clearance: 73.4 mL/min (by C-G formula based on SCr of 0.74 mg/dL).   Assessment: 64 year old female presented with increased weakness and fall. Patient receiving chemotherapy for breast cancer with mets to liver and brain. Ultrasound lower extremities positive for extensive occlusive and likely acute DVT throughout right lower extremity as well as what appears to be a combination of acute and chronic DVT in the left lower extremity. Pharmacy consulted for heparin drip for VTE treatment.  No PTA anticoagulant of record.  Started with heparin 4000 unit bolus followed by heparin drip at 1100 units/hr  02/03 2214 HL 1.61 (held for 1 hour and reduced rate to 900 u/hr) 02/04 7564 HL 1.16 (held for 1 hour and reduced rate to 750 u/hr) 02/04 1558 HL 0.35, therapeutic 02/04 2148 HL 0.34, therapeutic x 2 02/05 0902 HL 0.16, Heparin had been stopped at 05:30  02/06 0204 HL 0.17, subtherapeutic -  CBC slightly worse, H/H & PLTs trending down  Goal of Therapy:  Heparin level 0.3-0.7 units/ml Monitor platelets by anticoagulation protocol: Yes   Plan:  2/5 AM Heparin drip was stopped inadvertantly at 05:30. Resumed at previous rate of 750 units/hr at 08:30. Heparin to be stopped at 10:30 for EGD (needs to be off for 4 hrs prior). Will resume Heparin after procedure.  CBC daily while on Heparin.  2/5 PM follow up: Messaged hospitalist around 1800 (before shift change) who stated heparin can be resumed now.  Will enter order to resume heparin drip at previous rate of 750 units/hr (=7.5 ml/hr).  HL in 6h after restart and CBC in AM.  2/6 0204 HL subtherapeutic.  Will give bolus of 1000 units and increase infusion to 850 units/hr, recheck HL in 6 hours, continue to monitor CBC.  Pharmacy will continue to follow.   Hart Robinsons, PharmD Clinical Pharmacist   11/04/2019 2:46 AM

## 2019-11-04 NOTE — Progress Notes (Signed)
CRITICAL VALUE ALERT  Critical Value: WBC 1.3  Date & Time Notied:  11/04/2019 4132  Provider Notified: Rufina Falco, NP  Orders Received/Actions taken: No further orders received

## 2019-11-05 DIAGNOSIS — I82411 Acute embolism and thrombosis of right femoral vein: Principal | ICD-10-CM

## 2019-11-05 NOTE — Progress Notes (Signed)
Progress Note    Sarah Horne  LPF:[id number] DOB: Sep 27, 1956  DOA: 11/01/2019 PCP: Delsa Grana, PA-C      Brief Narrative:    Medical records reviewed and are as summarized below:  Sarah Horne is an 64 y.o. female with medical history significant for pulmonary embolism in 2018, Herceptin-induced cardiomyopathy, PSVT, sinus tachycardia, chemotherapy-induced peripheral neuropathy, epistaxis, CAD (heart attack in 1998), stage IV left breast cancer with metastasis to the liver, brain, T12 vertebra, hypertension, hyperlipidemia.  She presented to the hospital because of generalized weakness, right leg pain, right lower extremity swelling and difficulty walking.Marland Kitchen  She was found to have extensive DVT of the right lower extremity.      Assessment/Plan:   Principal Problem:   Acute deep vein thrombosis (DVT) of femoral vein of right lower extremity (HCC) Active Problems:   Carcinoma of upper-outer quadrant of left breast in female, estrogen receptor positive ([place])   Peripheral neuropathy due to chemotherapy [facility])   Acute extensive DVT of right lower extremity/?  Acute on chronic DVT in the left lower extremity: Continue Eliquis.  Plan for thrombectomy was aborted because of brain metastasis.  Patient already has an IVC filter in place (placed in May 2018).  PT and OT.  PT recommends discharge to SNF.  Hyponatremia/dehydration/elevated creatinine: Improved.   Chronic sinus tachycardia/history of PSVT: Continue metoprolol and flecainide.  Left breast cancer with metastasis to the liver, brain and T12: Follow-up with oncologist as an outpatient.  Dysphagia/cervical esophageal web recent thrush: Status post EGD on 11/02/2019 with dilation of cervical esophageal web.  Continue fluconazole (to be completed on 11/09/2019).  Peripheral neuropathy: Continue Lyrica  Elevated liver enzymes is likely due to liver metastasis.  Body mass index is 27.28 kg/m.   Family  Communication/Anticipated D/C date and plan/Code Status   DVT prophylaxis: IV heparin Code Status: DNR.  I was informed by Dr. Rogue Bussing, her oncologist, patient has changed her mind and wanted to be DNR so Dr. Rogue Bussing has changed her CODE STATUS in the chart to DNR to reflect this. Family Communication: Plan discussed with the patient Disposition Plan: Possible discharge to SNF in 2 to 3 days pending insurance authorization      Subjective:   No complaints.  No pain in the right lower extremity.  She was wondering when she will be discharged  Objective:    Vitals:   11/04/19 2037 11/05/19 0025 11/05/19 0537 11/05/19 0808  BP: 122/76 (!) 116/92 127/78 126/81  Pulse: (!) 108 (!) 102 (!) 101 99  Resp: 16 16 16 17   Temp: 97.8 F (36.6 C) 98.8 F (37.1 C) 98.5 F (36.9 C) 97.9 F (36.6 C)  TempSrc:  Oral Oral Oral  SpO2: 100% 100% 100% 100%  Weight:      Height:        Intake/Output Summary (Last 24 hours) at 11/05/2019 1457 Last data filed at 11/05/2019 0045 Gross per 24 hour  Intake --  Output 450 ml  Net -450 ml   Filed Weights   11/01/19 1249 11/01/19 1820 11/03/19 1618  Weight: 72.6 kg 73.7 kg 76.7 kg    Exam:  GEN: NAD SKIN: No rash EYES: No pallor or icterus  ENT: MMM CV: RRR PULM: No wheezing or rales heard. ABD: soft, ND, NT, +BS CNS: Alert and oriented to person and place. EXT: Significant swelling of entire right lower extremity from thigh down to the foot.  No erythema or tenderness.   Data Reviewed:  I have personally reviewed following labs and imaging studies:  Labs: Labs show the following:   Basic Metabolic Panel: Recent Labs  Lab 10/31/19 1453 10/31/19 1453 11/01/19 1425 11/01/19 1425 11/02/19 0623 11/03/19 0901  NA 133*  --  132*  --  136 136  K 4.6   < > 4.8   < > 4.5 4.2  CL 97*  --  97*  --  101 101  CO2 22  --  25  --  25 21*  GLUCOSE 119*  --  118*  --  90 78  BUN 27*  --  31*  --  28* 23  CREATININE 1.23*  --   1.27*  --  0.98 0.74  CALCIUM 9.1  --  9.1  --  8.9 8.5*  MG  --   --  2.7*  --   --   --    < > = values in this interval not displayed.   GFR Estimated Creatinine Clearance: 73.4 mL/min (by C-G formula based on SCr of 0.74 mg/dL). Liver Function Tests: Recent Labs  Lab 10/31/19 1453 11/01/19 1425 11/02/19 0623  AST 43* 43* 44*  ALT 61* 53* 46*  ALKPHOS 261* 230* 193*  BILITOT 2.2* 2.2* 1.8*  PROT 7.5 7.0 7.1  ALBUMIN 3.8 3.9 3.8   Recent Labs  Lab 11/01/19 1425  LIPASE 29   No results for input(s): AMMONIA in the last 168 hours. Coagulation profile Recent Labs  Lab 11/01/19 1425  INR 1.2    CBC: Recent Labs  Lab 10/31/19 1453 10/31/19 1453 11/01/19 1425 11/02/19 0623 11/03/19 0901 11/04/19 0204 11/04/19 0908  WBC 3.1*   < > 2.8* 2.2* 1.7* 1.3* 1.5*  NEUTROABS 2.4  --  2.4  --   --   --   --   HGB 11.9*   < > 10.5* 10.3* 9.5* 8.3* 8.9*  HCT 37.4   < > 32.2* 31.0* 29.9* 25.0* 27.4*  MCV 99.2   < > 97.9 98.1 99.0 100.4* 100.7*  PLT 192   < > 131* 138* 149* 128* 140*   < > = values in this interval not displayed.   Cardiac Enzymes: No results for input(s): CKTOTAL, CKMB, CKMBINDEX, TROPONINI in the last 168 hours. BNP (last 3 results) No results for input(s): PROBNP in the last 8760 hours. CBG: No results for input(s): GLUCAP in the last 168 hours. D-Dimer: No results for input(s): DDIMER in the last 72 hours. Hgb A1c: No results for input(s): HGBA1C in the last 72 hours. Lipid Profile: No results for input(s): CHOL, HDL, LDLCALC, TRIG, CHOLHDL, LDLDIRECT in the last 72 hours. Thyroid function studies: No results for input(s): TSH, T4TOTAL, T3FREE, THYROIDAB in the last 72 hours.  Invalid input(s): FREET3 Anemia work up: No results for input(s): VITAMINB12, FOLATE, FERRITIN, TIBC, IRON, RETICCTPCT in the last 72 hours. Sepsis Labs: Recent Labs  Lab 11/02/19 0623 11/03/19 0901 11/04/19 0204 11/04/19 0908  WBC 2.2* 1.7* 1.3* 1.5*     Microbiology Recent Results (from the past 240 hour(s))  Respiratory Panel by RT PCR (Flu A&B, Covid) - Nasopharyngeal Swab     Status: None   Collection Time: 11/01/19  3:53 PM   Specimen: Nasopharyngeal Swab  Result Value Ref Range Status   SARS Coronavirus 2 by RT PCR NEGATIVE NEGATIVE Final    Comment: (NOTE) SARS-CoV-2 target nucleic acids are NOT DETECTED. The SARS-CoV-2 RNA is generally detectable in upper respiratoy specimens during the acute phase of infection. The lowest  concentration of SARS-CoV-2 viral copies this assay can detect is 131 copies/mL. A negative result does not preclude SARS-Cov-2 infection and should not be used as the sole basis for treatment or other patient management decisions. A negative result may occur with  improper specimen collection/handling, submission of specimen other than nasopharyngeal swab, presence of viral mutation(s) within the areas targeted by this assay, and inadequate number of viral copies (<131 copies/mL). A negative result must be combined with clinical observations, patient history, and epidemiological information. The expected result is Negative. Fact Sheet for Patients:  PinkCheek.be Fact Sheet for Healthcare Providers:  GravelBags.it This test is not yet ap proved or cleared by the [place] FDA and  has been authorized for detection and/or diagnosis of SARS-CoV-2 by FDA under an Emergency Use Authorization (EUA). This EUA will remain  in effect (meaning this test can be used) for the duration of the COVID-19 declaration under Section 564(b)(1) of the Act, 21 U.S.C. section 360bbb-3(b)(1), unless the authorization is terminated or revoked sooner.    Influenza A by PCR NEGATIVE NEGATIVE Final   Influenza B by PCR NEGATIVE NEGATIVE Final    Comment: (NOTE) The Xpert Xpress SARS-CoV-2/FLU/RSV assay is intended as an aid in  the diagnosis of influenza from  Nasopharyngeal swab specimens and  should not be used as a sole basis for treatment. Nasal washings and  aspirates are unacceptable for Xpert Xpress SARS-CoV-2/FLU/RSV  testing. Fact Sheet for Patients: PinkCheek.be Fact Sheet for Healthcare Providers: GravelBags.it This test is not yet approved or cleared by the [place] FDA and  has been authorized for detection and/or diagnosis of SARS-CoV-2 by  FDA under an Emergency Use Authorization (EUA). This EUA will remain  in effect (meaning this test can be used) for the duration of the  Covid-19 declaration under Section 564(b)(1) of the Act, 21  U.S.C. section 360bbb-3(b)(1), unless the authorization is  terminated or revoked. Performed at [facility], [address] [place], [place] [address]     Procedures and diagnostic studies:  No results found.  Medications:   . apixaban  10 mg Oral BID  . [START ON 11/11/2019] apixaban  5 mg Oral BID  . cetirizine  10 mg Oral Daily  . Chlorhexidine Gluconate Cloth  6 each Topical Daily  . flecainide  50 mg Oral BID  . fluconazole  200 mg Oral Daily  . ipratropium  2 spray Each Nare BID  . magnesium oxide  400 mg Oral BID  . metoprolol tartrate  25 mg Oral BID  . montelukast  10 mg Oral QHS  . pregabalin  100 mg Oral BID   Continuous Infusions:    LOS: 4 days   Yecenia Dalgleish  [facility]     11/05/2019, 2:57 PM

## 2019-11-05 NOTE — Progress Notes (Signed)
DNR bracelet to patients right wrist

## 2019-11-05 NOTE — Assessment & Plan Note (Addendum)
#  64 year old female patient with metastatic breast cancer status post multiple lines of therapy currently admitted to hospital for right lower extremity swelling/acute DVT; and also dysphagia.  #Recurrent metastatic breast cancer ER/PR positive HER-2 negative-metastasis to the liver/brain-most recently on Adriamycin.  Currently on hold because of acute illness; plan to resume chemotherapy next week in the clinic.  #Right lower extremity DVT status post thrombectomy-on anticoagulation-Eliquis.  Patient will need indefinite Eliquis; STABLE.   #Leukopenia white count 1.5 unclear etiology-predominantly lymphopenia monitor for now; STABLE.   #CODE STATUS-DNR/DNI.  I again reiterated the importance of not driving-given the concern for slow reflexes; and history of brain mets.  #Disposition-awaiting discharge to rehab.  We will have the patient follow-up in the clinic later in the week to proceed with chemotherapy.

## 2019-11-05 NOTE — Consult Note (Signed)
[place] CONSULT NOTE  Patient Care Team: Delsa Grana, PA-C as PCP - General (Family Medicine) Wellington Hampshire, MD as PCP - Cardiology (Cardiology) Byrnett, Forest Gleason, MD (General Surgery) [name], MD (Inactive) as Attending Physician (Internal Medicine)  CHIEF COMPLAINTS/PURPOSE OF CONSULTATION:  Breast cancer metastatic  HISTORY OF PRESENTING ILLNESS:  Sarah Horne 64 y.o.  female with a history of metastatic breast cancer ER/PR positive HER-2 negative s/p multiple lines of therapy most recently on Adriamycin is currently in the hospital for worsening pain in the right lower extremity.  Ultrasound of the right lower extremity showed extensive DVT for which patient has been started on IV heparin.  Patient subsequently underwent thrombectomy.  Patient given history of dysphagia underwent EGD-dilatation of the esophageal webs.  Patient had expressed wishes for full code at admission.  Given her terminally breast cancer palliative care was consulted for further recommendations.  Oncology has been consulted to further discuss/establish CODE STATUS.  Patient denies any nausea vomiting.  Appetite is fair at best.  Patient is currently working with physical therapy.  Review of Systems  Constitutional: Positive for malaise/fatigue. Negative for chills, diaphoresis, fever and weight loss.  HENT: Negative for nosebleeds and sore throat.   Eyes: Negative for double vision.  Respiratory: Negative for cough, hemoptysis, sputum production, shortness of breath and wheezing.   Cardiovascular: Positive for leg swelling. Negative for chest pain, palpitations and orthopnea.  Gastrointestinal: Negative for abdominal pain, blood in stool, constipation, diarrhea, heartburn, melena, nausea and vomiting.  Genitourinary: Negative for dysuria, frequency and urgency.  Musculoskeletal: Negative for back pain and joint pain.  Skin: Negative.  Negative for itching and rash.   Neurological: Positive for weakness. Negative for dizziness, tingling, focal weakness and headaches.  Endo/Heme/Allergies: Does not bruise/bleed easily.  Psychiatric/Behavioral: Negative for depression. The patient is not nervous/anxious and does not have insomnia.      MEDICAL HISTORY:  Past Medical History:  Diagnosis Date  . Chemotherapy-induced peripheral neuropathy ([place])   . Epistaxis    a. 11/2016 in setting of asa/plavix-->silver nitrate cauterization.  . GI bleed    a. 11/2016 Admission w/ GIB and hypovolemic shock req 3u PRBC's;  b. 11/2016 ECG: gastritis & nonbleeding peptic ulcer; c. 11/2016 Conlonoscopy: rectal and sigmoid colonic ulcers.  . Heart attack ([place])    a. 1998 Cath @ [facility]: reportedly no intervention required.  Marland Kitchen Herceptin-induced cardiomyopathy ([place])    a. In the setting of Herceptin Rx for breast cancer (initiated 12/2014); b. 03/2015 MUGA EF 64%; b. 08/2015 MUGA: EF 51%; c. 10/2015 MUGA: EF 44%; d. 11/2015 Echo: EF 45-50%; e. 01/2016 MUGA: EF 60%; f. 06/2016 MUGA EF 65%; g. 10/2016 MUGA: EF 61%;  h. 12/2016 Echo: EF 55-60%, gr1 DD.  Marland Kitchen Hyperlipidemia   . Hypertension   . Neuropathy   . Possible PAD (peripheral artery disease) ([place])    a. 11/2016 LE cyanosis and weak pulses-->CTA w/o significant Ao-BiFem dzs. ? distal dzs-->ASA/Plavix initiated by vascular surgery.  Marland Kitchen PSVT (paroxysmal supraventricular tachycardia) ([place])    a. Dx 11/2016.  . Pulmonary embolism ([place])    a. 12/2016 CTA Chest: small nonocclusive PE in inferior segment of the Left lingula, somewhat eccentric filling defect suggesting chronic rather than acute embolic event; b. 12/6566 LE U/S:  No DVT; c. 12/2016 Echo: Nl RV fxn, nl PASP.  Marland Kitchen Recurrent Metastatic breast cancer ([place])    a. Dx 2016: Stage II, ER positive, PR positive, HER-2/neu overexpressing of the left breast-->chemo/radiation;  b. 10/2016 CT Abd/pelvis: diffuse liver mets, ill defined sclerotic bone lesions-T12;  c. 10/2016 MRI brain: metastatic lesion  along L temporal lobe (19x32m) w/ extensive surrounding edema & 577mmidline shift to right.  . Sepsis ([place]  . Sinus tachycardia     SURGICAL HISTORY: Past Surgical History:  Procedure Laterality Date  . BREAST BIOPSY Left 2016   Positive  . BREAST LUMPECTOMY WITH SENTINEL LYMPH NODE BIOPSY Left 05/23/2015   Procedure: LEFT BREAST WIDE EXCISION WITH AXILLARY DISSECTION, MASTOPLASTY ;  Surgeon: JeRobert BellowMD;  Location: [facility] ORS;  Service: General;  Laterality: Left;  . BREAST SURGERY Left 12/18/14   breast biopsy/INVASIVE DUCTAL CARCINOMA OF BREAST, NOTTINGHAM GRADE 2.  . Marland KitchenREAST SURGERY  05/23/2015.   Wide excision/mastoplasty, axillary dissection. No residual invasive cancer, positive for residual DCIS. 0/2 nodes identified on axillary dissection. (no SLN by technetium or methylene blue)  . CARDIAC CATHETERIZATION    . COLONOSCOPY WITH PROPOFOL N/A 12/10/2016   Procedure: COLONOSCOPY WITH PROPOFOL;  Surgeon: DaLucilla LameMD;  Location: [facility] ENDOSCOPY;  Service: Endoscopy;  Laterality: N/A;  . COLONOSCOPY WITH PROPOFOL N/A 02/13/2017   Procedure: COLONOSCOPY WITH PROPOFOL;  Surgeon: WoLucilla LameMD;  [facility];  Service: Endoscopy;  Laterality: N/A;  . ESOPHAGOGASTRODUODENOSCOPY N/A 11/03/2019   Procedure: ESOPHAGOGASTRODUODENOSCOPY (EGD);  Surgeon: VaLin LandsmanMD;  [facility];  Service: Gastroenterology;  Laterality: N/A;  . ESOPHAGOGASTRODUODENOSCOPY (EGD) WITH PROPOFOL N/A 12/08/2016   Procedure: ESOPHAGOGASTRODUODENOSCOPY (EGD) WITH PROPOFOL;  Surgeon: DaLucilla LameMD;  Location: [facility] END[facility]COPY;  Service: Endoscopy;  Laterality: N/A;  . IVC FILTER INSERTION N/A 02/15/2017   Procedure: IVC Filter Insertion;  Surgeon: DeAlgernon HuxleyMD;  [facility];  Service: Cardiovascular;  Laterality: N/A;  . PORTACATH PLACEMENT Right 12-31-14   Dr ByBary Castilla  SOCIAL HISTORY: Social History   Socioeconomic History  . Marital status:  Divorced    Spouse name: Not on file  . Number of children: Not on file  . Years of education: Not on file  . Highest education level: Not on file  Occupational History  . Not on file  Tobacco Use  . Smoking status: Light Tobacco Smoker    Packs/day: 0.50    Years: 18.00    Pack years: 9.00    Types: Cigarettes  . Smokeless tobacco: Never Used  Substance and Sexual Activity  . Alcohol use: No    Alcohol/week: 0.0 standard drinks  . Drug use: No  . Sexual activity: Not on file  Other Topics Concern  . Not on file  Social History Narrative   [place]   Social Determinants of Health   Financial Resource Strain:   . Difficulty of Paying Living Expenses: Not on file  Food Insecurity:   . Worried About RuCharity fundraisern the Last Year: Not on file  . Ran Out of Food in the Last Year: Not on file  Transportation Needs:   . Lack of Transportation (Medical): Not on file  . Lack of Transportation (Non-Medical): Not on file  Physical Activity:   . Days of Exercise per Week: Not on file  . Minutes of Exercise per Session: Not on file  Stress:   . Feeling of Stress : Not on file  Social Connections:   . Frequency of Communication with Friends and Family: Not on file  . Frequency of Social Gatherings with Friends and Family: Not on file  .  Attends Religious Services: Not on file  . Active Member of Clubs or Organizations: Not on file  . Attends Archivist Meetings: Not on file  . Marital Status: Not on file  Intimate Partner Violence:   . Fear of Current or Ex-Partner: Not on file  . Emotionally Abused: Not on file  . Physically Abused: Not on file  . Sexually Abused: Not on file    FAMILY HISTORY: Family History  Problem Relation Age of Onset  . Breast cancer Maternal Aunt   . Breast cancer Cousin   . Brain cancer Maternal Uncle   . Diabetes Mother   . Hypertension Mother   . Stroke Mother     ALLERGIES:  is allergic to no known  allergies.  MEDICATIONS:  Current Facility-Administered Medications  Medication Dose Route Frequency Provider Last Rate Last Admin  . acetaminophen (TYLENOL) tablet 650 mg  650 mg Oral Q6H PRN Jennye Boroughs, MD      . apixaban Arne Cleveland) tablet 10 mg  10 mg Oral BID Jennye Boroughs, MD   10 mg at 11/05/19 1008  . [START ON 11/11/2019] apixaban (ELIQUIS) tablet 5 mg  5 mg Oral BID Jennye Boroughs, MD      . cetirizine (ZYRTEC) tablet 10 mg  10 mg Oral Daily Jennye Boroughs, MD   10 mg at 11/05/19 1010  . Chlorhexidine Gluconate Cloth 2 % PADS 6 each  6 each Topical Daily Jennye Boroughs, MD   6 each at 11/04/19 1038  . flecainide (TAMBOCOR) tablet 50 mg  50 mg Oral BID Jennye Boroughs, MD   50 mg at 11/05/19 1010  . fluconazole (DIFLUCAN) tablet 200 mg  200 mg Oral Daily Jennye Boroughs, MD   200 mg at 11/05/19 1011  . ipratropium (ATROVENT) 0.03 % nasal spray 2 spray  2 spray Each Nare BID Jennye Boroughs, MD   2 spray at 11/05/19 1013  . magnesium oxide (MAG-OX) tablet 400 mg  400 mg Oral BID Jennye Boroughs, MD   400 mg at 11/05/19 1008  . meclizine (ANTIVERT) tablet 25 mg  25 mg Oral TID PRN Jennye Boroughs, MD      . metoprolol tartrate (LOPRESSOR) tablet 25 mg  25 mg Oral BID Jennye Boroughs, MD   25 mg at 11/05/19 1011  . montelukast (SINGULAIR) tablet 10 mg  10 mg Oral QHS Jennye Boroughs, MD   10 mg at 11/04/19 2159  . morphine 4 MG/ML injection 4 mg  4 mg Intravenous Q6H PRN Jennye Boroughs, MD      . ondansetron [facility]) injection 4 mg  4 mg Intravenous Q6H PRN Jennye Boroughs, MD      . oxyCODONE (Oxy IR/ROXICODONE) immediate release tablet 5 mg  5 mg Oral Q4H PRN Lang Snow, NP   5 mg at 11/05/19 0547  . pregabalin (LYRICA) capsule 100 mg  100 mg Oral BID Jennye Boroughs, MD   100 mg at 11/05/19 1007   Facility-Administered Medications Ordered in Other Encounters  Medication Dose Route Frequency Provider Last Rate Last Admin  . 0.9 %  sodium chloride infusion   Intravenous  Continuous [name], MD   New Bag at 12/10/16 1140  . 0.9 %  sodium chloride infusion   Intravenous Continuous Lloyd Huger, MD 999 mL/hr at 04/24/15 1520 New Bag at 05/23/15 0951  . 0.9 %  sodium chloride infusion   Intravenous Continuous Verlon Au, NP 999 mL/hr at 10/31/19 1522 New Bag at 10/31/19 1522  .  heparin lock flush 100 unit/mL  500 Units Intravenous Once Berenzon, Dmitriy, MD      . sodium chloride 0.9 % injection 10 mL  10 mL Intravenous PRN [name], MD   10 mL at 04/04/15 1440  . sodium chloride flush (NS) 0.9 % injection 10 mL  10 mL Intravenous PRN Berenzon, Dmitriy, MD          .  PHYSICAL EXAMINATION:  Vitals:   11/05/19 0537 11/05/19 0808  BP: 127/78 126/81  Pulse: (!) 101 99  Resp: 16 17  Temp: 98.5 F (36.9 C) 97.9 F (36.6 C)  SpO2: 100% 100%   Filed Weights   11/01/19 1249 11/01/19 1820 11/03/19 1618  Weight: 160 lb (72.6 kg) 162 lb 7.7 oz (73.7 kg) 169 lb (76.7 kg)    Physical Exam  Constitutional: She is oriented to person, place, and time and well-developed, well-nourished, and in no distress.  Moon facies-secondary steroids.  HENT:  Head: Normocephalic and atraumatic.  Mouth/Throat: Oropharynx is clear and moist. No oropharyngeal exudate.  Eyes: Pupils are equal, round, and reactive to light.  Cardiovascular: Regular rhythm.  Tachycardic regular.  Pulmonary/Chest: No respiratory distress. She has no wheezes.  Abdominal: Soft. Bowel sounds are normal. She exhibits no distension and no mass. There is no abdominal tenderness. There is no rebound and no guarding.  Positive for hepatomegaly.  Musculoskeletal:        General: No tenderness or edema. Normal range of motion.     Cervical back: Normal range of motion and neck supple.  Neurological: She is alert and oriented to person, place, and time.  Skin: Skin is warm.  Psychiatric: Affect normal.     LABORATORY DATA:  I have reviewed the data as listed Lab Results   Component Value Date   WBC 1.5 (L) 11/04/2019   HGB 8.9 (L) 11/04/2019   HCT 27.4 (L) 11/04/2019   MCV 100.7 (H) 11/04/2019   PLT 140 (L) 11/04/2019   Recent Labs    10/31/19 1453 10/31/19 1453 11/01/19 1425 11/02/19 0623 11/03/19 0901  NA 133*   < > 132* 136 136  K 4.6   < > 4.8 4.5 4.2  CL 97*   < > 97* 101 101  CO2 22   < > 25 25 21*  GLUCOSE 119*   < > 118* 90 78  BUN 27*   < > 31* 28* 23  CREATININE 1.23*   < > 1.27* 0.98 0.74  CALCIUM 9.1   < > 9.1 8.9 8.5*  GFRNONAA 46*   < > 44* >60 >60  GFRAA 53*   < > 51* >60 >60  PROT 7.5  --  7.0 7.1  --   ALBUMIN 3.8  --  3.9 3.8  --   AST 43*  --  43* 44*  --   ALT 61*  --  53* 46*  --   ALKPHOS 261*  --  230* 193*  --   BILITOT 2.2*  --  2.2* 1.8*  --    < > = values in this interval not displayed.    RADIOGRAPHIC STUDIES: I have personally reviewed the radiological images as listed and agreed with the findings in the report. DG Abd 1 View  Result Date: 11/02/2019 CLINICAL DATA:  IVC filter present. EXAM: ABDOMEN - 1 VIEW COMPARISON:  Abdominal x-ray dated Feb 20, 2018. FINDINGS: The bowel gas pattern is normal. Oral contrast in the colon. No radio-opaque calculi or other significant radiographic  abnormality are seen. Unchanged IVC filter. Calcified fibroids in the pelvis again noted. Unchanged nodular contour of the inferior right liver due to known hepatic metastatic disease. No acute osseous abnormality. IMPRESSION: No acute findings. Electronically Signed   By: Titus Dubin M.D.   On: 11/02/2019 15:37   MR BRAIN W WO CONTRAST  Result Date: 11/02/2019 CLINICAL DATA:  Metastatic breast cancer, follow-up EXAM: MRI HEAD WITHOUT AND WITH CONTRAST TECHNIQUE: Multiplanar, multiecho pulse sequences of the brain and surrounding structures were obtained without and with intravenous contrast. CONTRAST:  15m GADAVIST GADOBUTROL 1 MMOL/ML IV SOLN COMPARISON:  08/31/2019 FINDINGS: There is motion artifact particularly on postcontrast  imaging. Brain: There is suboptimal comparison with the prior study due to artifact. Enhancing lesions do not appear substantially changed. Several of the lesions are not identified, but this is probably secondary to small size in the setting of artifact. There is no definite new lesion. No new edema. There is no acute infarction or intracranial hemorrhage. Confluent areas of T2 hyperintensity in the supratentorial white matter likely reflects therapy related change. There is no hydrocephalus. Vascular: Major vessel flow voids at the skull base are preserved. Skull and upper cervical spine: Normal marrow signal is preserved. Sinuses/Orbits: Paranasal sinuses are aerated. Orbits are unremarkable. Other: Sella is unremarkable.  Mastoid air cells are clear. IMPRESSION: Suboptimal evaluation and comparison due to motion artifact. However, there is no evidence of new or progressive disease. Electronically Signed   By: PMacy MisM.D.   On: 11/02/2019 12:57   [place]ous Img Lower Bilateral  Result Date: 11/01/2019 CLINICAL DATA:  64year old female with leg pain EXAM: BILATERAL LOWER EXTREMITY VENOUS DOPPLER ULTRASOUND TECHNIQUE: Gray-scale sonography with graded compression, as well as color Doppler and duplex ultrasound were performed to evaluate the lower extremity deep venous systems from the level of the common femoral vein and including the common femoral, femoral, profunda femoral, popliteal and calf veins including the posterior tibial, peroneal and gastrocnemius veins when visible. The superficial great saphenous vein was also interrogated. Spectral Doppler was utilized to evaluate flow at rest and with distal augmentation maneuvers in the common femoral, femoral and popliteal veins. COMPARISON:  None. FINDINGS: RIGHT LOWER EXTREMITY Common Femoral Vein: Noncompressible. The lumen is expanded and filled with heterogeneous internal echoes. No evidence of flow on color Doppler imaging. Findings are consistent  with occlusive thrombus. Saphenofemoral Junction: Occlusive thrombus extends into the proximal saphenofemoral junction. Profunda Femoral Vein: Occlusive thrombus extends into the profunda femoral vein. Femoral Vein: Occlusive thrombus extends throughout the femoral vein in the thigh. Popliteal Vein: Occlusive thrombus extends through the popliteal vein. Calf Veins: Occlusive thrombus extends into the posterior tibial, peroneal and gastrocnemius veins. Superficial Great Saphenous Vein: Positive thrombus in the small saphenous vein. The great saphenous vein is patent and compressible. Venous Reflux:  None. Other Findings:  None. LEFT LOWER EXTREMITY Common Femoral Vein: Partially compressible. There is some eccentric wall thickening in the left common vein consistent with chronic DVT. Saphenofemoral Junction: Eccentric wall thickening extends into the saphenofemoral junction. Profunda Femoral Vein: Wall thickening extends into the profunda femoral vein. Femoral Vein: Wall thickening extends into the femoral vein. Popliteal Vein: The popliteal vein is only partially compressible. The lumen is expanded and filled with heterogeneous echoes. There is a segment of the vein without evidence of vascular flow on color Doppler consistent with occlusive DVT. Calf Veins: No evidence of thrombus. Normal compressibility and flow on color Doppler imaging. Superficial Great Saphenous Vein: Noncompressible. Echogenic material  noted throughout the great saphenous vein. Venous Reflux:  None. Other Findings:  None. IMPRESSION: 1. Positive for extensive occlusive and likely acute DVT throughout the right lower extremity. 2. Positive for what appears to be a combination of acute and chronic DVT in the left lower extremity. There is a relatively short segment of occlusive and likely acute DVT in the popliteal vein with findings more consistent with chronic, or nonocclusive DVT in the remaining venous structures. Electronically Signed   By:  Jacqulynn Cadet M.D.   On: 11/01/2019 14:36   DG ESOPHAGUS W DOUBLE CM (HD)  Result Date: 10/27/2019 CLINICAL DATA:  Dysphagia. Difficulty swallowing pills and solid food. EXAM: ESOPHOGRAM/BARIUM SWALLOW TECHNIQUE: Combined double contrast and single contrast examination performed using effervescent crystals, thick barium liquid, and thin barium liquid. FLUOROSCOPY TIME:  Fluoroscopy Time:  1 minutes 18 seconds. Radiation Exposure Index (if provided by the fluoroscopic device): 27.2 mGy COMPARISON:  CT chest 10/05/2019. FINDINGS: Standard double and single contrast barium swallow performed. A prominent cervical esophageal web is noted. No evidence of aspiration. Thoracic esophagus is widely patent. Peristalsis normal. No tablet given due to the upper esophageal web. IMPRESSION: 1.  Prominent cervical esophageal web.  No evidence of aspiration. 2. Thoracic esophagus is normal. No evidence of hiatal hernia or reflux. Electronically Signed   By: Marcello Moores  Register   On: 10/27/2019 11:54    Carcinoma of upper-outer quadrant of left breast in female, estrogen receptor positive ([place]) #64 year old female patient with metastatic breast cancer status post multiple lines of therapy currently admitted to hospital for right lower extremity swelling/acute DVT; and also dysphagia.  #Recurrent metastatic breast cancer ER/PR positive HER-2 negative-metastasis to the liver/brain-most recently on Adriamycin.  We will plan to resume chemotherapy post discharge.  #Right lower extremity DVT status post thrombectomy-on anticoagulation-Eliquis.  Patient will need indefinite Eliquis.  #Leukopenia white count 1.5 unclear etiology-predominantly lymphopenia monitor for now.  #Dysphagia s/p EGD dilatation of the esophageal webs.  Improved.  #Chronic sinus tachycardia- on metoprolol-/Tamabcor-overall stable  # Peripheral neuropathy grade 1-2-  on Lyric-stable  #CODE STATUS-patient had previously met with palliative care  outpatient-signed MOST form/agreed to be DNR/DNI.  However, on current admission patient did change her mind-and had been full code.  I had a long discussion with patient regarding futility of aggressive measures of going through [place] given her underlying extensive malignancy.  Patient seems to understand that going through [place] would cause more harm/pain than benefit.  She agrees to be DNR/DNI today; and as such CODE STATUS is changed to reflect patient's wishes.  Discussed with nurse.  Also discussed with Dr. Mal Misty.   #Disposition-discussed the recommendation physical therapy for rehab.  However patient interested in going home.  I had a long discussion with patient regarding the concerns of going home as she lives by herself and the risk of fall on anticoagulation.  Defer to primary service.  Thank you Dr.Ayiku for allowing me to participate in the care of your pleasant patient. Please do not hesitate to contact me with questions or concerns in the interim.   All questions were answered. The patient knows to call the clinic with any problems, questions or concerns.    Cammie Sickle, MD 11/05/2019 11:07 AM

## 2019-11-05 NOTE — TOC Progression Note (Addendum)
Transition of Care [facility]) - Progression Note    Patient Details  Name: Sarah Horne MRN: [id number] Date of Birth: 06-15-1956  Transition of Care [facility] CM/SW [place] Phone Number: 11/05/2019, 1:46 PM  Clinical Narrative:     CSW noticed that patient was accepted to [facility] for SNF placement. CSW called into the patient's room and asked if the patient was still interested in SNF placement and that she has been accepted to [facility].   Patient stated that she does not want to go to SNF and she wants to go home. She states that she declines wanting to go to [facility]/a SNF placement. Patient states that she wants to stick with her home health agency [facility]) and go home.   CSW attempted to call pt's brother, whom she lived with, but he did not answer.  Expected Discharge Plan: [place] Barriers to Discharge: Continued Medical Work up  Expected Discharge Plan and Services Expected Discharge Plan: [place] In-house Referral: Clinical Social Work     Living arrangements for the past 2 months: Single Family Home                                       Social Determinants of Health (SDOH) Interventions    Readmission Risk Interventions No flowsheet data found.

## 2019-11-05 NOTE — Plan of Care (Signed)
  Problem: Education: Goal: Knowledge of General Education information will improve Description: Including pain rating scale, medication(s)/side effects and non-pharmacologic comfort measures Outcome: Progressing   Problem: Consults Goal: Venous Thromboembolism Patient Education Description: See Patient Education Module for education specifics. Outcome: Progressing   Problem: Phase I Progression Outcomes Goal: Pain controlled with appropriate interventions Outcome: Progressing

## 2019-11-05 NOTE — Progress Notes (Signed)
Physical Therapy Treatment Patient Details Name: Sarah Horne MRN: [id number] DOB: 12/23/1955 Today's Date: 11/05/2019    History of Present Illness Sarah Horne is a 33yoF who comes to [facility] 11/01/19 c generalized weakness and RLE pain. She has not been able to walk since yesterday because of severe right leg pain. Swelling has progressively worsened. She was found to be dehydrated so she was given 1 L of Ringer's lactate in the ED.  She was also given IV fentanyl for pain.  Venous duplex showed extensive DVT in the right lower extremity. Vascular consult 2/4 reports, 'unfortunately due to the presence of brain metastasis we are unable to offer thrombolysis /thrombectomy.' PMH: PE (2018), Herceptin-induced cardiomyopathy, PSVT, sinus tachycardia, chemotherapy-induced PND, epistaxis, CAD (MI 1998), stage IV left breast CA c mets to liver and brain and T12 vertebra, hypertension, HLD.    PT Comments    Discussed with MD in hallway.  Requests session today.  Stated pt has chronically high HR at baseline.  Pt agrees to session.  Very slow to initiate movement.  Holds stomach in pain.  Stated she needs to use bathroom.  Encouraged pt to wait and get to commode as it seemed she would just urinate in bed on pads.  She is able to transfer to commode with increased time and encouragement, multiple attempts to stand and relies on writer and commode for support.  Voids and provides her own self care.  She is encouraged to use walker to ambulate 5' to recliner.  After seated rest and clearing a path, she is able to walk 10' 20' and 25' with RW and min assist.  Gait generally unsteady but no buckling or LOB noted.  HR increases to 145 max at times and time given to allow for HR to decrease.  Upon return to room, she tries to leave walker to the side.  She was encouraged to keep it with her to get to the chair.    Pt demonstrates overall decreased safety awareness, limitation due to weakness and general fatigue,  limited by HR and some cognition deficits.  SNF is appropriate at this time unless pt has strong 24 hour care to assist at home as she is unsafe for mobility on her own at this time.   Follow Up Recommendations  SNF     Equipment Recommendations  Rolling walker with 5" wheels;3in1 (PT)    Recommendations for Other Services       Precautions / Restrictions Precautions Precautions: Fall Restrictions Weight Bearing Restrictions: No Other Position/Activity Restrictions: HR - chronically high at baseline per MD    Mobility  Bed Mobility Overal bed mobility: Needs Assistance Bed Mobility: Supine to Sit     Supine to sit: Min assist     General bed mobility comments: heavy effort, cues for safety and to attempt to task.  Transfers Overall transfer level: Needs assistance Equipment used: Rolling walker (2 wheeled);1 person hand held assist Transfers: Sit to/from [facility] Sit to Stand: Min assist Stand pivot transfers: Min assist          Ambulation/Gait Ambulation/Gait assistance: Min assist;Supervision Gait Distance (Feet): 35 Feet Assistive device: Rolling walker (2 wheeled) Gait Pattern/deviations: Step-through pattern;Decreased step length - right;Decreased step length - left Gait velocity: decreased   General Gait Details: Generally unsteady , HR up to 145 with gait   Stairs             Wheelchair Mobility    Modified Rankin (Stroke Patients Only)  Balance Overall balance assessment: Needs assistance Sitting-balance support: Feet supported Sitting balance-Leahy Scale: Good     Standing balance support: Bilateral upper extremity supported Standing balance-Leahy Scale: Poor Standing balance comment: heavy reliance o n RW for support and poor awareness of limitations                            Cognition Arousal/Alertness: Awake/alert Behavior During Therapy: WFL for tasks assessed/performed Overall Cognitive  Status: Impaired/Different from baseline                         Following Commands: Follows one step commands consistently;Follows one step commands inconsistently;Follows one step commands with increased time Safety/Judgement: Decreased awareness of deficits;Decreased awareness of safety   Problem Solving: Slow processing;Requires verbal cues;Difficulty sequencing        Exercises      General Comments        Pertinent Vitals/Pain Pain Assessment: No/denies pain    Home Living                      Prior Function            PT Goals (current goals can now be found in the care plan section) Progress towards PT goals: Progressing toward goals    Frequency    Min 2X/week      PT Plan Current plan remains appropriate    Co-evaluation              AM-PAC PT "6 Clicks" Mobility   Outcome Measure  Help needed turning from your back to your side while in a flat bed without using bedrails?: A Little Help needed moving from lying on your back to sitting on the side of a flat bed without using bedrails?: A Little Help needed moving to and from a bed to a chair (including a wheelchair)?: A Little Help needed standing up from a chair using your arms (e.g., wheelchair or bedside chair)?: A Little Help needed to walk in hospital room?: A Little Help needed climbing 3-5 steps with a railing? : A Lot 6 Click Score: 17    End of Session Equipment Utilized During Treatment: Gait belt Activity Tolerance: Patient tolerated treatment well Patient left: in chair;with call bell/phone within reach;with chair alarm set Nurse Communication: Mobility status       Time: 0350-0938 PT Time Calculation (min) (ACUTE ONLY): 26 min  Charges:  $Gait Training: 8-22 mins $Therapeutic Activity: 8-22 mins                    Chesley Noon, PTA 11/05/19, 10:14 AM

## 2019-11-05 NOTE — Progress Notes (Signed)
   11/05/19 1315  Clinical Encounter Type  Visited With Patient  Visit Type Follow-up  Referral From Nurse  Consult/Referral To Chaplain  Spiritual Encounters  Spiritual Needs Prayer;Emotional  [facility] received request from RN Lauren to visit patient. Patient was alert and awake and seated in chair upon arrival. Pt was talkative. Shared how she couldn't wait for discharge. Spent several minutes building rapport through small talk. Pt has strong faith in God. [place] prayed with patient upon request.

## 2019-11-06 NOTE — Progress Notes (Signed)
Progress Note    Sarah Horne  QZE:[id number] DOB: Aug 20, 1956  DOA: 11/01/2019 PCP: Delsa Grana, PA-C      Brief Narrative:    Medical records reviewed and are as summarized below:  Sarah Horne is an 64 y.o. female with medical history significant for pulmonary embolism in 2018, Herceptin-induced cardiomyopathy, PSVT, sinus tachycardia, chemotherapy-induced peripheral neuropathy, epistaxis, CAD (heart attack in 1998), stage IV left breast cancer with metastasis to the liver, brain, T12 vertebra, hypertension, hyperlipidemia.  She presented to the hospital because of generalized weakness, right leg pain, right lower extremity swelling and difficulty walking.Marland Kitchen  She was found to have extensive DVT of the right lower extremity.      Assessment/Plan:   Principal Problem:   Acute deep vein thrombosis (DVT) of femoral vein of right lower extremity (HCC) Active Problems:   Carcinoma of upper-outer quadrant of left breast in female, estrogen receptor positive ([place])   Peripheral neuropathy due to chemotherapy [facility])   Acute extensive DVT of right lower extremity/?  Acute on chronic DVT in the left lower extremity: Continue Eliquis.  Plan for thrombectomy was aborted because of brain metastasis.  Patient already has an IVC filter in place (placed in May 2018).  PT and OT.  PT and OT recommend discharge to SNF.  Hyponatremia/dehydration/elevated creatinine: Improved.   Chronic sinus tachycardia/history of PSVT: Continue metoprolol and flecainide.  Left breast cancer with metastasis to the liver, brain and T12: Follow-up with oncologist as an outpatient.  Dysphagia/cervical esophageal web recent thrush: Status post EGD on 11/02/2019 with dilation of cervical esophageal web.  Dysphagia improved after dilation.  Continue fluconazole (to be completed on 11/09/2019).  Peripheral neuropathy: Continue Lyrica  Elevated liver enzymes is likely due to liver metastasis.  Pancytopenia:  Probably due to chemotherapy/malignancy.  Patient had Adriamycin on October 13, 2019.  Body mass index is 27.28 kg/m.   Family Communication/Anticipated D/C date and plan/Code Status   DVT prophylaxis: IV heparin Code Status: DNR.  Family Communication: Plan discussed with the patient Disposition Plan: Possible discharge to SNF tomorrow pending insurance authorization     Subjective:   No complaints.  No leg pain.  No difficulty in swallowing.  Objective:    Vitals:   11/05/19 2319 11/06/19 0807 11/06/19 0943 11/06/19 1007  BP: 111/69 101/68 93/64   Pulse: (!) 106 (!) 109 (!) 117 (!) 119  Resp: 15 18    Temp: 98.2 F (36.8 C) 98.6 F (37 C)    TempSrc: Oral Oral    SpO2: 99% 100%    Weight:      Height:        Intake/Output Summary (Last 24 hours) at 11/06/2019 1435 Last data filed at 11/06/2019 1300 Gross per 24 hour  Intake 960 ml  Output 300 ml  Net 660 ml   Filed Weights   11/01/19 1249 11/01/19 1820 11/03/19 1618  Weight: 72.6 kg 73.7 kg 76.7 kg    Exam:  GEN: NAD SKIN: No rash EYES: EOMI ENT: MMM CV: RRR PULM: Clear to auscultation bilaterally  ABD: soft, ND, NT, +BS CNS: Alert and oriented to person and place. EXT: Significant swelling of entire right lower extremity from thigh down to the foot.  No erythema or tenderness.   Data Reviewed:   I have personally reviewed following labs and imaging studies:  Labs: Labs show the following:   Basic Metabolic Panel: Recent Labs  Lab 10/31/19 1453 10/31/19 1453 11/01/19 1425 11/01/19 1425 11/02/19  3716 11/03/19 0901  NA 133*  --  132*  --  136 136  K 4.6   < > 4.8   < > 4.5 4.2  CL 97*  --  97*  --  101 101  CO2 22  --  25  --  25 21*  GLUCOSE 119*  --  118*  --  90 78  BUN 27*  --  31*  --  28* 23  CREATININE 1.23*  --  1.27*  --  0.98 0.74  CALCIUM 9.1  --  9.1  --  8.9 8.5*  MG  --   --  2.7*  --   --   --    < > = values in this interval not displayed.   GFR Estimated Creatinine  Clearance: 73.4 mL/min (by C-G formula based on SCr of 0.74 mg/dL). Liver Function Tests: Recent Labs  Lab 10/31/19 1453 11/01/19 1425 11/02/19 0623  AST 43* 43* 44*  ALT 61* 53* 46*  ALKPHOS 261* 230* 193*  BILITOT 2.2* 2.2* 1.8*  PROT 7.5 7.0 7.1  ALBUMIN 3.8 3.9 3.8   Recent Labs  Lab 11/01/19 1425  LIPASE 29   No results for input(s): AMMONIA in the last 168 hours. Coagulation profile Recent Labs  Lab 11/01/19 1425  INR 1.2    CBC: Recent Labs  Lab 10/31/19 1453 10/31/19 1453 11/01/19 1425 11/02/19 0623 11/03/19 0901 11/04/19 0204 11/04/19 0908  WBC 3.1*   < > 2.8* 2.2* 1.7* 1.3* 1.5*  NEUTROABS 2.4  --  2.4  --   --   --   --   HGB 11.9*   < > 10.5* 10.3* 9.5* 8.3* 8.9*  HCT 37.4   < > 32.2* 31.0* 29.9* 25.0* 27.4*  MCV 99.2   < > 97.9 98.1 99.0 100.4* 100.7*  PLT 192   < > 131* 138* 149* 128* 140*   < > = values in this interval not displayed.   Cardiac Enzymes: No results for input(s): CKTOTAL, CKMB, CKMBINDEX, TROPONINI in the last 168 hours. BNP (last 3 results) No results for input(s): PROBNP in the last 8760 hours. CBG: No results for input(s): GLUCAP in the last 168 hours. D-Dimer: No results for input(s): DDIMER in the last 72 hours. Hgb A1c: No results for input(s): HGBA1C in the last 72 hours. Lipid Profile: No results for input(s): CHOL, HDL, LDLCALC, TRIG, CHOLHDL, LDLDIRECT in the last 72 hours. Thyroid function studies: No results for input(s): TSH, T4TOTAL, T3FREE, THYROIDAB in the last 72 hours.  Invalid input(s): FREET3 Anemia work up: No results for input(s): VITAMINB12, FOLATE, FERRITIN, TIBC, IRON, RETICCTPCT in the last 72 hours. Sepsis Labs: Recent Labs  Lab 11/02/19 0623 11/03/19 0901 11/04/19 0204 11/04/19 0908  WBC 2.2* 1.7* 1.3* 1.5*    Microbiology Recent Results (from the past 240 hour(s))  Respiratory Panel by RT PCR (Flu A&B, Covid) - Nasopharyngeal Swab     Status: None   Collection Time: 11/01/19  3:53 PM    Specimen: Nasopharyngeal Swab  Result Value Ref Range Status   SARS Coronavirus 2 by RT PCR NEGATIVE NEGATIVE Final    Comment: (NOTE) SARS-CoV-2 target nucleic acids are NOT DETECTED. The SARS-CoV-2 RNA is generally detectable in upper respiratoy specimens during the acute phase of infection. The lowest concentration of SARS-CoV-2 viral copies this assay can detect is 131 copies/mL. A negative result does not preclude SARS-Cov-2 infection and should not be used as the sole basis for treatment or other  patient management decisions. A negative result may occur with  improper specimen collection/handling, submission of specimen other than nasopharyngeal swab, presence of viral mutation(s) within the areas targeted by this assay, and inadequate number of viral copies (<131 copies/mL). A negative result must be combined with clinical observations, patient history, and epidemiological information. The expected result is Negative. Fact Sheet for Patients:  PinkCheek.be Fact Sheet for Healthcare Providers:  GravelBags.it This test is not yet ap proved or cleared by the [place] FDA and  has been authorized for detection and/or diagnosis of SARS-CoV-2 by FDA under an Emergency Use Authorization (EUA). This EUA will remain  in effect (meaning this test can be used) for the duration of the COVID-19 declaration under Section 564(b)(1) of the Act, 21 U.S.C. section 360bbb-3(b)(1), unless the authorization is terminated or revoked sooner.    Influenza A by PCR NEGATIVE NEGATIVE Final   Influenza B by PCR NEGATIVE NEGATIVE Final    Comment: (NOTE) The Xpert Xpress SARS-CoV-2/FLU/RSV assay is intended as an aid in  the diagnosis of influenza from Nasopharyngeal swab specimens and  should not be used as a sole basis for treatment. Nasal washings and  aspirates are unacceptable for Xpert Xpress SARS-CoV-2/FLU/RSV  testing. Fact  Sheet for Patients: PinkCheek.be Fact Sheet for Healthcare Providers: GravelBags.it This test is not yet approved or cleared by the [place] FDA and  has been authorized for detection and/or diagnosis of SARS-CoV-2 by  FDA under an Emergency Use Authorization (EUA). This EUA will remain  in effect (meaning this test can be used) for the duration of the  Covid-19 declaration under Section 564(b)(1) of the Act, 21  U.S.C. section 360bbb-3(b)(1), unless the authorization is  terminated or revoked. Performed at [facility], [address] [place], [place] [address]     Procedures and diagnostic studies:  No results found.  Medications:   . apixaban  10 mg Oral BID  . [START ON 11/11/2019] apixaban  5 mg Oral BID  . cetirizine  10 mg Oral Daily  . Chlorhexidine Gluconate Cloth  6 each Topical Daily  . flecainide  50 mg Oral BID  . fluconazole  200 mg Oral Daily  . ipratropium  2 spray Each Nare BID  . magnesium oxide  400 mg Oral BID  . metoprolol tartrate  25 mg Oral BID  . montelukast  10 mg Oral QHS  . pregabalin  100 mg Oral BID   Continuous Infusions:    LOS: 5 days   Phoenyx Paulsen  [facility]     11/06/2019, 2:35 PM

## 2019-11-06 NOTE — Plan of Care (Signed)
  Problem: Education: Goal: Knowledge of General Education information will improve Description: Including pain rating scale, medication(s)/side effects and non-pharmacologic comfort measures Outcome: Progressing   Problem: Consults Goal: Venous Thromboembolism Patient Education Description: See Patient Education Module for education specifics. Outcome: Progressing Goal: Diagnosis - Venous Thromboembolism (VTE) Description: Choose a selection Outcome: Progressing   Problem: Phase I Progression Outcomes Goal: Pain controlled with appropriate interventions Outcome: Progressing   Problem: Skin Integrity: Goal: Risk for impaired skin integrity will decrease Outcome: Progressing

## 2019-11-06 NOTE — Progress Notes (Signed)
Raahi Korber Frank   DOB:December 17, 1955   TM#:[id number]    Subjective: Patient had a good night.  She was able to sleep well with no pain.  She is up in the bed having breakfast.  She has been working with physical therapy-however she is recommended rehab placement.  Otherwise no nausea no vomiting.  Objective:  Vitals:   11/06/19 1636 11/06/19 1940  BP: 100/67 100/70  Pulse: (!) 102 (!) 103  Resp: 16 17  Temp: 98.6 F (37 C) 98.5 F (36.9 C)  SpO2: 100% 98%     Intake/Output Summary (Last 24 hours) at 11/06/2019 2246 Last data filed at 11/06/2019 1700 Gross per 24 hour  Intake 960 ml  Output 300 ml  Net 660 ml    Physical Exam  Constitutional: She is oriented to person, place, and time and well-developed, well-nourished, and in no distress.  HENT:  Head: Normocephalic and atraumatic.  Mouth/Throat: Oropharynx is clear and moist. No oropharyngeal exudate.  Eyes: Pupils are equal, round, and reactive to light.  Cardiovascular: Regular rhythm.  Tachycardia  Pulmonary/Chest: Effort normal and breath sounds normal. No respiratory distress. She has no wheezes.  Abdominal: Soft. Bowel sounds are normal. She exhibits no distension and no mass. There is no abdominal tenderness. There is no rebound and no guarding.  Musculoskeletal:        General: No tenderness or edema. Normal range of motion.     Cervical back: Normal range of motion and neck supple.  Neurological: She is alert and oriented to person, place, and time.  Skin: Skin is warm.  Psychiatric: Affect normal.     Labs:  Lab Results  Component Value Date   WBC 1.5 (L) 11/04/2019   HGB 8.9 (L) 11/04/2019   HCT 27.4 (L) 11/04/2019   MCV 100.7 (H) 11/04/2019   PLT 140 (L) 11/04/2019   NEUTROABS 2.4 11/01/2019    Lab Results  Component Value Date   NA 136 11/03/2019   K 4.2 11/03/2019   CL 101 11/03/2019   CO2 21 (L) 11/03/2019    Studies:  No results found.  Carcinoma of upper-outer quadrant of left breast in  female, estrogen receptor positive ([place]) #64 year old female patient with metastatic breast cancer status post multiple lines of therapy currently admitted to hospital for right lower extremity swelling/acute DVT; and also dysphagia.  #Recurrent metastatic breast cancer ER/PR positive HER-2 negative-metastasis to the liver/brain-most recently on Adriamycin.  Currently on hold because of acute illness; plan to resume chemotherapy next week in the clinic.  #Right lower extremity DVT status post thrombectomy-on anticoagulation-Eliquis.  Patient will need indefinite Eliquis; STABLE.   #Leukopenia white count 1.5 unclear etiology-predominantly lymphopenia monitor for now; STABLE.   #Dysphagia s/p EGD dilatation of the esophageal webs- improved.   #Chronic sinus tachycardia- on metoprolol-/Tamabcor-stable.  # Peripheral neuropathy grade 1-2-  on Lyric-STABLE.  #CODE STATUS-DNR/DNI.   #Disposition-patient is recommended physical therapy at rehab; however patient is reluctant.  Awaiting final decision at this time.  I do long discussion the patient's brother Dean-regarding patient's overall poor prognosis.  Discussed that her prognosis would be few months-if she continues to have complications/poor response to therapy.  However if she has no further complications; and response to therapy-life expectancy is in order of many months [6-8 months].  However, given patient's borderline performance status-diffuse metastatic disease/including brain metastases-patient unfortunately at risk for multiple complications.  As per patient's/family's preference-I think is reasonable to continue palliative chemotherapy as long patient is able to tolerate  without any major side effects.  Also discussed with the patient the above recommendations; and the plan to stop chemotherapy and transition to hospice if risk of chemotherapy outweighs the benefit.   # 40 minutes face-to-face with the patient discussing the above plan of  care; more than 50% of time spent on prognosis/ natural history; counseling and coordination.      Cammie Sickle, MD 11/06/2019  10:46 PM

## 2019-11-06 NOTE — Progress Notes (Addendum)
Physical Therapy Treatment Patient Details Name: Sarah Horne MRN: [id number] DOB: 10-15-1955 Today's Date: 11/06/2019    History of Present Illness Pt is a 24yoF who comes to [facility] 11/01/19 c generalized weakness and RLE pain. She has not been able to walk since yesterday because of severe right leg pain. Swelling has progressively worsened. She was found to be dehydrated so she was given 1 L of Ringer's lactate in the ED.  She was also given IV fentanyl for pain.  Venous duplex showed extensive DVT in the right lower extremity. Vascular consult 2/4 reports, 'unfortunately due to the presence of brain metastasis we are unable to offer thrombolysis /thrombectomy.' PMH: PE (2018), Herceptin-induced cardiomyopathy, PSVT, sinus tachycardia, chemotherapy-induced PND, epistaxis, CAD (MI 1998), stage IV left breast CA c mets to liver and brain and T12 vertebra, hypertension, HLD.    PT Comments    Pt in bed upon arrival, still mildly confused, but agreeable to participate, motivated to AMB this date. Pt able to progress AMB to 33ft bouts x3, but these remain a struggle with intermittent standing breaks requisite. Pt requires seated recovery at EOB between efforts. HR in upper 110s at rest, increases to 125bpm with AMB. SpO2 remain upper 90s throughout on room air. Pt progressing well in general but still mildly confused and significantly limited by fatigue.     Follow Up Recommendations  SNF     Equipment Recommendations  Rolling walker with 5" wheels;3in1 (PT)    Recommendations for Other Services       Precautions / Restrictions Precautions Precautions: Fall Restrictions Other Position/Activity Restrictions: HR - chronically high at baseline per MD    Mobility  Bed Mobility Overal bed mobility: Needs Assistance Bed Mobility: Supine to Sit;Sit to Supine     Supine to sit: Min guard Sit to supine: Mod assist   General bed mobility comments: weak, unable to get legs back into  bed  Transfers Overall transfer level: Needs assistance Equipment used: Rolling walker (2 wheeled) Transfers: Sit to/from Stand Sit to Stand: Supervision         General transfer comment: extensive safety cues as pt attempts to pull RW into bed. Pt able to safely pushup from mattress and transition hands to RW while hinged at hips  Ambulation/Gait Ambulation/Gait assistance: Min guard Gait Distance (Feet): 65 Feet Assistive device: Rolling walker (2 wheeled) Gait Pattern/deviations: Step-to pattern;Shuffle;Decreased dorsiflexion - right;Decreased dorsiflexion - left     General Gait Details: HR 125 c AMB, 110s resting.   Stairs             Wheelchair Mobility    Modified Rankin (Stroke Patients Only)       Balance Overall balance assessment: Needs assistance Sitting-balance support: Feet supported Sitting balance-Leahy Scale: Fair Sitting balance - Comments: gross trunk fatigue, favors lateral collapse to HOB to rest   Standing balance support: Bilateral upper extremity supported;During functional activity Standing balance-Leahy Scale: Poor Standing balance comment: poor tpostural control, RW too far, begins to kick back leg of RW                            Cognition Arousal/Alertness: Awake/alert Behavior During Therapy: WFL for tasks assessed/performed Overall Cognitive Status: Impaired/Different from baseline Area of Impairment: Attention;Following commands;Safety/judgement;Problem solving                 Orientation Level: Person;Situation;Place     Following Commands: Follows one step commands consistently;Follows multi-step commands  inconsistently Safety/Judgement: Decreased awareness of deficits;Decreased awareness of safety   Problem Solving: Slow processing;Requires verbal cues        Exercises Other Exercises Other Exercises: AMB into hall 3x69ft; pt requires stnading rest 15-30sec halfway to recover, 2-3 minutes recovery  between bout 2/2 substantial fatigue.    General Comments        Pertinent Vitals/Pain Pain Assessment: No/denies pain    Home Living                      Prior Function            PT Goals (current goals can now be found in the care plan section) Acute Rehab PT Goals Patient Stated Goal: get better and go home PT Goal Formulation: With patient Time For Goal Achievement: 11/17/19 Potential to Achieve Goals: Fair Progress towards PT goals: Progressing toward goals    Frequency    Min 2X/week      PT Plan Current plan remains appropriate    Co-evaluation              AM-PAC PT "6 Clicks" Mobility   Outcome Measure  Help needed turning from your back to your side while in a flat bed without using bedrails?: A Lot Help needed moving from lying on your back to sitting on the side of a flat bed without using bedrails?: A Lot Help needed moving to and from a bed to a chair (including a wheelchair)?: A Little Help needed standing up from a chair using your arms (e.g., wheelchair or bedside chair)?: A Little Help needed to walk in hospital room?: A Little Help needed climbing 3-5 steps with a railing? : A Lot 6 Click Score: 15    End of Session Equipment Utilized During Treatment: Gait belt Activity Tolerance: Patient tolerated treatment well;Patient limited by fatigue Patient left: with call bell/phone within reach;in bed;with bed alarm set Nurse Communication: Mobility status PT Visit Diagnosis: Unsteadiness on feet (R26.81);Other abnormalities of gait and mobility (R26.89);Muscle weakness (generalized) (M62.81);History of falling (Z91.81);Difficulty in walking, not elsewhere classified (R26.2)     Time: 1531-1600 PT Time Calculation (min) (ACUTE ONLY): 29 min  Charges:  $Gait Training: 8-22 mins $Therapeutic Exercise: 8-22 mins                     5:22 PM, 11/06/19 Etta Grandchild, PT, DPT Physical Therapist - [facility]  [phone] ([place])    Hamad Whyte C 11/06/2019, 5:19 PM

## 2019-11-06 NOTE — Care Management Important Message (Signed)
Important Message  Patient Details  Name: Sarah Horne MRN: [id number] Date of Birth: 07-Jun-1956   Medicare Important Message Given:  Yes     [name] A Kamani Lewter 11/06/2019, 11:38 AM

## 2019-11-06 NOTE — TOC Progression Note (Signed)
Transition of Care [facility]) - Progression Note    Patient Details  Name: Sarah Horne MRN: [id number] Date of Birth: 12-Jul-1956  [facility] of [facility]) CM/SW Contact  Su Hilt, RN Phone Number: 11/06/2019, 11:25 AM  Clinical Narrative:    Spoke with the patient and reviewed the bed offer from [facility], she has accepted the bed offer, I explained that We have to get insurance auth first.  She stated understanding, I notified Tina at [facility], I called Sharon to start insurance auth process,  They no longer manage the patient and instructed to go to [facility] [employer], I notified Otila Kluver at [facility] that it is not [facility] and she will start the auth process with [employer]   Expected Discharge Plan: [place] Barriers to Discharge: Continued Medical Work up  Expected Discharge Plan and Services Expected Discharge Plan: [place] In-house Referral: Clinical Social Work Discharge Planning Services: CM Consult   Living arrangements for the past 2 months: Single Family Home                                       Social Determinants of Health (SDOH) Interventions    Readmission Risk Interventions No flowsheet data found.

## 2019-11-06 NOTE — Progress Notes (Addendum)
SLP Cancellation Note  Patient Details Name: Sarah Horne MRN: [id number] DOB: 04/03/1956   Cancelled treatment:       Reason Eval/Treat Not Completed: SLP screened, no needs identified, will sign off(chart reviewed; consulted NSG then met w/ pt in room). Pt had just finished eating breakfast --- she reported goo tolerance of the mea stating it was "easier now". Pt was seen by GI for Esophgeal Dilation for a Web found in the proximal Esophagus. Pt is on a regular consistency diet now. Briefly discussed general aspiration and REFLUX precautions. Recommend f/u w/ GI for any ongoing management. Pt agreed. NSG to reconsult if any further needs while admitted.      Orinda Kenner, MS, CCC-SLP Lucindia Lemley 11/06/2019, 10:36 AM

## 2019-11-06 NOTE — Evaluation (Signed)
Occupational Therapy Evaluation Patient Details Name: Sarah Horne MRN: [id number] DOB: [birth year] Today's Date: 11/06/2019    History of Present Illness Pt is a 50yoF who comes to [facility] 11/01/19 c generalized weakness and RLE pain. She has not been able to walk since yesterday because of severe right leg pain. Swelling has progressively worsened. She was found to be dehydrated so she was given 1 L of Ringer's lactate in the ED.  She was also given IV fentanyl for pain.  Venous duplex showed extensive DVT in the right lower extremity. Vascular consult 2/4 reports, 'unfortunately due to the presence of brain metastasis we are unable to offer thrombolysis /thrombectomy.' PMH: PE (2018), Herceptin-induced cardiomyopathy, PSVT, sinus tachycardia, chemotherapy-induced PND, epistaxis, CAD (MI 1998), stage IV left breast CA c mets to liver and brain and T12 vertebra, hypertension, HLD.   Clinical Impression   Pt seen for OT evaluation this date. Pt was independent in all ADL and functional mobility (RW for longer distances), living in a 1 story home with 3 steps to enter with L handrail and tub/shower. Pt reports becoming easily fatigued or out of breath with minimal exertion, particularly with LB ADL tasks and all mobility. Pt currently requires Min assist for LB ADL and ADL transfers due to current functional impairments (See OT Problem List below). Pt educated in energy conservation strategies including pursed lip breathing, activity pacing, home/routines modifications, work simplification, AE/DME, prioritizing of meaningful occupations, and falls prevention. Handout provided. Pt verbalized understanding and would benefit from additional skilled OT services to maximize recall and carryover of learned techniques and facilitate implementation of learned techniques into daily routines. Upon discharge, recommend SNF services.      Follow Up Recommendations  SNF    Equipment Recommendations  3 in 1 bedside  commode;Tub/shower seat    Recommendations for Other Services       Precautions / Restrictions Precautions Precautions: Fall Restrictions Weight Bearing Restrictions: No Other Position/Activity Restrictions: HR - chronically high at baseline per MD      Mobility Bed Mobility Overal bed mobility: Needs Assistance Bed Mobility: Supine to Sit;Sit to Supine     Supine to sit: Min guard Sit to supine: Min guard      Transfers Overall transfer level: Needs assistance Equipment used: Rolling walker (2 wheeled) Transfers: Sit to/from Stand Sit to Stand: Min assist              Balance Overall balance assessment: Needs assistance Sitting-balance support: Feet supported Sitting balance-Leahy Scale: Good     Standing balance support: Bilateral upper extremity supported Standing balance-Leahy Scale: Poor                             ADL either performed or assessed with clinical judgement   ADL Overall ADL's : Needs assistance/impaired                                       General ADL Comments: Min A for LB ADL including ADL transfers, set up and supervision for EOB UB ADL tasks     Vision Baseline Vision/History: Wears glasses Wears Glasses: At all times Patient Visual Report: No change from baseline       Perception     Praxis      Pertinent Vitals/Pain Pain Assessment: 0-10 Pain Score: 6  Pain Location: chronic peripheral neuropathy  in B hands and feet Pain Descriptors / Indicators: Aching Pain Intervention(s): Limited activity within patient's tolerance;Monitored during session     Hand Dominance Right   Extremity/Trunk Assessment Upper Extremity Assessment Upper Extremity Assessment: Generalized weakness   Lower Extremity Assessment Lower Extremity Assessment: Generalized weakness       Communication Communication Communication: No difficulties   Cognition Arousal/Alertness: Awake/alert Behavior During Therapy:  WFL for tasks assessed/performed Overall Cognitive Status: Impaired/Different from baseline Area of Impairment: Attention;Following commands;Safety/judgement;Problem solving                   Current Attention Level: Sustained   Following Commands: Follows one step commands consistently;Follows multi-step commands inconsistently Safety/Judgement: Decreased awareness of deficits;Decreased awareness of safety   Problem Solving: Slow processing;Requires verbal cues     General Comments       Exercises Other Exercises Other Exercises: Pt instructed in falls prevention strategies and energy conservation strategies including home/routines modifications, work simplification, AE/DME, activity pacing, engagement in meaningful occupations, and pursed lip breathing; handout provided, would benefit from additional instruction to support recall and carryover   Shoulder Instructions      Home Living Family/patient expects to be discharged to:: Private residence Living Arrangements: Alone Available Help at Discharge: Family;Available PRN/intermittently(brother) Type of Home: House Home Access: Stairs to enter [employer] of Steps: 3 Entrance Stairs-Rails: Left Home Layout: One level     Bathroom Shower/Tub: Teacher, early years/pre: Standard     Home Equipment: Environmental consultant - 2 wheels;Bedside commode;Shower seat          Prior Functioning/Environment Level of Independence: Independent with assistive device(s)        Comments: Uses RW on occasion for longer distances;  pt mostly household ambulator. Indep with ADL, light meal prep, med mgt, driving and running errands.        OT Problem List: Decreased strength;Cardiopulmonary status limiting activity;Decreased cognition;Decreased safety awareness;Decreased activity tolerance;Impaired balance (sitting and/or standing);Decreased knowledge of use of DME or AE      OT Treatment/Interventions: Self-care/ADL  training;Therapeutic exercise;Therapeutic activities;DME and/or AE instruction;Patient/family education;Energy conservation;Balance training    OT Goals(Current goals can be found in the care plan section) Acute Rehab OT Goals Patient Stated Goal: get better and go home OT Goal Formulation: With patient Time For Goal Achievement: 11/20/19 Potential to Achieve Goals: Good ADL Goals Pt Will Perform Lower Body Dressing: sit to/from stand;with supervision;with adaptive equipment Pt Will Transfer to Toilet: with supervision;ambulating(BSC over toilet, LRAD for amb) Additional ADL Goal #1: Pt will verbalize plan to implement at least 2 learned energy conservation strategies to maximize safety/indep with ADL and IADL tasks.  OT Frequency: Min 1X/week   Barriers to D/C:            Co-evaluation              AM-PAC OT "6 Clicks" Daily Activity     Outcome Measure Help from another person eating meals?: None Help from another person taking care of personal grooming?: None Help from another person toileting, which includes using toliet, bedpan, or urinal?: A Little Help from another person bathing (including washing, rinsing, drying)?: A Little Help from another person to put on and taking off regular upper body clothing?: A Little Help from another person to put on and taking off regular lower body clothing?: A Little 6 Click Score: 20   End of Session    Activity Tolerance: Patient tolerated treatment well Patient left: in bed;with call bell/phone within  reach;with bed alarm set  OT Visit Diagnosis: Other abnormalities of gait and mobility (R26.89);Muscle weakness (generalized) (M62.81);Other symptoms and signs involving cognitive function                Time: 9292-4462 OT Time Calculation (min): 37 min Charges:  OT General Charges $OT Visit: 1 Visit OT Evaluation $OT Eval Moderate Complexity: 1 Mod OT Treatments $Self Care/Home Management : 23-37 mins  Jeni Salles, MPH,  MS, OTR/L ascom [phone] 11/06/19, 1:18 PM

## 2019-11-06 NOTE — Progress Notes (Signed)
[facility] [place] visited pt. as follow-up from visit Sat.  Specifically, [place] provided info re: MedAlert button which pt. inquired about in prior visit.  Ch left brochure from [facility], site explaining details of [place] plan, should pt. wish to utilize this resource.  [place] engaged in small talk and built rapport w/pt.; pt. expressed the desire to go straight home w/visiting PT help rather than going to rehab facility and [facility] agreed to pass along this concern to case manager.  No further needs expressed.  [facility] discussed pt.'s discharge wishes w/ case manager; case manager explained need for rehab --pt. lives alone and is not appropriate to resume independent living.         11/06/19 1500  Clinical Encounter Type  Visited With Patient;Health care provider  Visit Type Follow-up;Psychological support;Social support  Spiritual Encounters  Spiritual Needs Emotional  Stress Factors  Patient Stress Factors Health changes;Financial concerns

## 2019-11-07 ENCOUNTER — Telehealth: Payer: Self-pay | Admitting: Internal Medicine

## 2019-11-07 ENCOUNTER — Inpatient Hospital Stay: Payer: Medicare HMO | Admitting: Hospice and Palliative Medicine

## 2019-11-07 DIAGNOSIS — G620 Drug-induced polyneuropathy: Secondary | ICD-10-CM | POA: Diagnosis not present

## 2019-11-07 DIAGNOSIS — M8580 Other specified disorders of bone density and structure, unspecified site: Secondary | ICD-10-CM | POA: Diagnosis not present

## 2019-11-07 DIAGNOSIS — I82411 Acute embolism and thrombosis of right femoral vein: Secondary | ICD-10-CM | POA: Diagnosis not present

## 2019-11-07 DIAGNOSIS — M2550 Pain in unspecified joint: Secondary | ICD-10-CM | POA: Diagnosis not present

## 2019-11-07 DIAGNOSIS — C50412 Malignant neoplasm of upper-outer quadrant of left female breast: Secondary | ICD-10-CM | POA: Diagnosis not present

## 2019-11-07 DIAGNOSIS — C50919 Malignant neoplasm of unspecified site of unspecified female breast: Secondary | ICD-10-CM | POA: Diagnosis not present

## 2019-11-07 DIAGNOSIS — R945 Abnormal results of liver function studies: Secondary | ICD-10-CM | POA: Diagnosis not present

## 2019-11-07 DIAGNOSIS — Z79818 Long term (current) use of other agents affecting estrogen receptors and estrogen levels: Secondary | ICD-10-CM | POA: Diagnosis not present

## 2019-11-07 DIAGNOSIS — I82401 Acute embolism and thrombosis of unspecified deep veins of right lower extremity: Secondary | ICD-10-CM | POA: Diagnosis not present

## 2019-11-07 DIAGNOSIS — G9009 Other idiopathic peripheral autonomic neuropathy: Secondary | ICD-10-CM | POA: Diagnosis not present

## 2019-11-07 DIAGNOSIS — E785 Hyperlipidemia, unspecified: Secondary | ICD-10-CM | POA: Diagnosis not present

## 2019-11-07 DIAGNOSIS — I2510 Atherosclerotic heart disease of native coronary artery without angina pectoris: Secondary | ICD-10-CM | POA: Diagnosis not present

## 2019-11-07 DIAGNOSIS — D649 Anemia, unspecified: Secondary | ICD-10-CM | POA: Diagnosis not present

## 2019-11-07 DIAGNOSIS — D72819 Decreased white blood cell count, unspecified: Secondary | ICD-10-CM | POA: Diagnosis not present

## 2019-11-07 DIAGNOSIS — R000 Tachycardia, unspecified: Secondary | ICD-10-CM | POA: Diagnosis not present

## 2019-11-07 DIAGNOSIS — T451X5A Adverse effect of antineoplastic and immunosuppressive drugs, initial encounter: Secondary | ICD-10-CM | POA: Diagnosis not present

## 2019-11-07 DIAGNOSIS — Z9221 Personal history of antineoplastic chemotherapy: Secondary | ICD-10-CM | POA: Diagnosis not present

## 2019-11-07 DIAGNOSIS — R7989 Other specified abnormal findings of blood chemistry: Secondary | ICD-10-CM | POA: Diagnosis not present

## 2019-11-07 DIAGNOSIS — I10 Essential (primary) hypertension: Secondary | ICD-10-CM | POA: Diagnosis not present

## 2019-11-07 DIAGNOSIS — R531 Weakness: Secondary | ICD-10-CM | POA: Diagnosis not present

## 2019-11-07 DIAGNOSIS — Z86711 Personal history of pulmonary embolism: Secondary | ICD-10-CM | POA: Diagnosis not present

## 2019-11-07 DIAGNOSIS — Z79899 Other long term (current) drug therapy: Secondary | ICD-10-CM | POA: Diagnosis not present

## 2019-11-07 DIAGNOSIS — C7931 Secondary malignant neoplasm of brain: Secondary | ICD-10-CM | POA: Diagnosis not present

## 2019-11-07 DIAGNOSIS — J302 Other seasonal allergic rhinitis: Secondary | ICD-10-CM | POA: Diagnosis not present

## 2019-11-07 DIAGNOSIS — Z86718 Personal history of other venous thrombosis and embolism: Secondary | ICD-10-CM | POA: Diagnosis not present

## 2019-11-07 DIAGNOSIS — G629 Polyneuropathy, unspecified: Secondary | ICD-10-CM | POA: Diagnosis not present

## 2019-11-07 DIAGNOSIS — Z7401 Bed confinement status: Secondary | ICD-10-CM | POA: Diagnosis not present

## 2019-11-07 DIAGNOSIS — Z5111 Encounter for antineoplastic chemotherapy: Secondary | ICD-10-CM | POA: Diagnosis not present

## 2019-11-07 DIAGNOSIS — E569 Vitamin deficiency, unspecified: Secondary | ICD-10-CM | POA: Diagnosis not present

## 2019-11-07 DIAGNOSIS — R1310 Dysphagia, unspecified: Secondary | ICD-10-CM | POA: Diagnosis not present

## 2019-11-07 DIAGNOSIS — Z515 Encounter for palliative care: Secondary | ICD-10-CM | POA: Diagnosis not present

## 2019-11-07 DIAGNOSIS — R5383 Other fatigue: Secondary | ICD-10-CM | POA: Diagnosis not present

## 2019-11-07 DIAGNOSIS — Z7901 Long term (current) use of anticoagulants: Secondary | ICD-10-CM | POA: Diagnosis not present

## 2019-11-07 DIAGNOSIS — F17210 Nicotine dependence, cigarettes, uncomplicated: Secondary | ICD-10-CM | POA: Diagnosis not present

## 2019-11-07 DIAGNOSIS — M6281 Muscle weakness (generalized): Secondary | ICD-10-CM | POA: Diagnosis not present

## 2019-11-07 DIAGNOSIS — I82409 Acute embolism and thrombosis of unspecified deep veins of unspecified lower extremity: Secondary | ICD-10-CM | POA: Diagnosis not present

## 2019-11-07 DIAGNOSIS — J029 Acute pharyngitis, unspecified: Secondary | ICD-10-CM | POA: Diagnosis not present

## 2019-11-07 DIAGNOSIS — Z170 Estrogen receptor positive status [ER+]: Secondary | ICD-10-CM | POA: Diagnosis not present

## 2019-11-07 LAB — RESPIRATORY PANEL BY RT PCR (FLU A&B, COVID)
Influenza A by PCR: NEGATIVE
Influenza B by PCR: NEGATIVE
SARS Coronavirus 2 by RT PCR: NEGATIVE

## 2019-11-07 LAB — CBC WITH DIFFERENTIAL/PLATELET
Abs Immature Granulocytes: 0.01 10*3/uL (ref 0.00–0.07)
Basophils Absolute: 0 10*3/uL (ref 0.0–0.1)
Basophils Relative: 0 %
Eosinophils Absolute: 0 10*3/uL (ref 0.0–0.5)
Eosinophils Relative: 0 %
HCT: 32.2 % — ABNORMAL LOW (ref 36.0–46.0)
Hemoglobin: 9.9 g/dL — ABNORMAL LOW (ref 12.0–15.0)
Immature Granulocytes: 0 %
Lymphocytes Relative: 30 %
Lymphs Abs: 0.7 10*3/uL (ref 0.7–4.0)
MCH: 31.8 pg (ref 26.0–34.0)
MCHC: 30.7 g/dL (ref 30.0–36.0)
MCV: 103.5 fL — ABNORMAL HIGH (ref 80.0–100.0)
Monocytes Absolute: 0.6 10*3/uL (ref 0.1–1.0)
Monocytes Relative: 23 %
Neutro Abs: 1.1 10*3/uL — ABNORMAL LOW (ref 1.7–7.7)
Neutrophils Relative %: 47 %
Platelets: 242 10*3/uL (ref 150–400)
RBC: 3.11 MIL/uL — ABNORMAL LOW (ref 3.87–5.11)
RDW: 20 % — ABNORMAL HIGH (ref 11.5–15.5)
WBC: 2.4 10*3/uL — ABNORMAL LOW (ref 4.0–10.5)
nRBC: 0.8 % — ABNORMAL HIGH (ref 0.0–0.2)

## 2019-11-07 MED ORDER — APIXABAN 5 MG PO TABS: 10.0000 mg | ORAL_TABLET | Freq: Two times a day (BID) | ORAL | Status: DC

## 2019-11-07 MED ORDER — APIXABAN 5 MG PO TABS: 5.0000 mg | ORAL_TABLET | Freq: Two times a day (BID) | ORAL | Status: DC

## 2019-11-07 NOTE — TOC Progression Note (Signed)
Transition of Care [facility]) - Progression Note    Patient Details  Name: DELYLAH STANCZYK MRN: [id number] Date of Birth: 12-15-1955  Transition of Care [facility]) CM/SW [place], RN Phone Number: 11/07/2019, 9:25 AM  Clinical Narrative:    Received Auth approval and notified by Otila Kluver at [facility], the patient will need a new covid test and I notified the physician    Expected Discharge Plan: [place] Barriers to Discharge: Continued Medical Work up  Expected Discharge Plan and Services Expected Discharge Plan: [place] In-house Referral: Clinical Social Work Discharge Planning Services: CM Consult   Living arrangements for the past 2 months: Single Family Home                                       Social Determinants of Health (SDOH) Interventions    Readmission Risk Interventions No flowsheet data found.

## 2019-11-07 NOTE — Discharge Summary (Signed)
Physician Discharge Summary  Sarah Horne:[id number] DOB: Nov 18, 1955 DOA: 11/01/2019  PCP: Delsa Grana, PA-C  Admit date: 11/01/2019 Discharge date: 11/07/2019  Discharge disposition: Skilled nursing facility   Recommendations for Outpatient Follow-Up:   Follow-up with oncologist, Dr. Rogue Bussing as scheduled   Discharge Diagnosis:   Principal Problem:   Acute deep vein thrombosis (DVT) of femoral vein of right lower extremity ([place]) Active Problems:   Carcinoma of upper-outer quadrant of left breast in female, estrogen receptor positive ([place])   Peripheral neuropathy due to chemotherapy [facility])    Discharge Condition: Stable.  Diet recommendation: Regular diet  Code status: DNR    Hospital Course:   Sarah Horne is an 64 y.o. female with medical history significant for pulmonary embolism in 2018, Herceptin-induced cardiomyopathy, PSVT, chronic sinus tachycardia, chemotherapy-induced peripheral neuropathy, epistaxis, CAD (heart attack in 1998), stage IV left breast cancer with metastasis to the liver, brain, T12 vertebra, hypertension, hyperlipidemia. She presented to the hospital because of generalized weakness, right leg pain, right lower extremity swelling and difficulty walking.  She was found to have extensive DVT of the right lower extremity.  She was treated with IV heparin infusion.  Vascular surgery was consulted and thrombectomy was considered.  However, plan for thrombectomy was aborted because MRI of the brain showed brain metastasis.  She was subsequently switched to Eliquis.  She complained of dysphagia.  A barium swallow had revealed cervical esophageal web.  Gastroenterologist was consulted and EGD with esophageal dilation was done on 11/02/2019.  Dysphagia has improved.  She was on fluconazole, started prior to admission on October 27, 2019 and to be taken for 14 days.  However, fluconazole will be discontinued at discharge since there was no evidence of  esophageal candidiasis.  She was evaluated by PT and OT who recommended further rehabilitation at a skilled nursing facility.  Patient is agreeable to go to SNF.  She is deemed stable for discharge today.      Discharge Exam:   Vitals:   11/07/19 1001 11/07/19 1144  BP: 107/71 105/73  Pulse: (!) 106 98  Resp:  17  Temp:    SpO2:  100%   Vitals:   11/07/19 0430 11/07/19 0734 11/07/19 1001 11/07/19 1144  BP: 120/76 108/66 107/71 105/73  Pulse: 79 76 (!) 106 98  Resp: 18 17  17   Temp: 98 F (36.7 C) 98.1 F (36.7 C)    TempSrc: Oral Oral    SpO2: 100% 96%  100%  Weight:      Height:         GEN: NAD SKIN: No rash EYES: EOMI ENT: MMM CV: RRR PULM: CTA B ABD: soft, ND, NT, +BS CNS: AAO x 3, non focal EXT: Right lower extremity swelling is slowly improving.  No tenderness or erythema.   The results of significant diagnostics from this hospitalization (including imaging, microbiology, ancillary and laboratory) are listed below for reference.     Procedures and Diagnostic Studies:   DG Abd 1 View  Result Date: 11/02/2019 CLINICAL DATA:  IVC filter present. EXAM: ABDOMEN - 1 VIEW COMPARISON:  Abdominal x-ray dated Feb 20, 2018. FINDINGS: The bowel gas pattern is normal. Oral contrast in the colon. No radio-opaque calculi or other significant radiographic abnormality are seen. Unchanged IVC filter. Calcified fibroids in the pelvis again noted. Unchanged nodular contour of the inferior right liver due to known hepatic metastatic disease. No acute osseous abnormality. IMPRESSION: No acute findings. Electronically Signed   By:  Titus Dubin M.D.   On: 11/02/2019 15:37   MR BRAIN W WO CONTRAST  Result Date: 11/02/2019 CLINICAL DATA:  Metastatic breast cancer, follow-up EXAM: MRI HEAD WITHOUT AND WITH CONTRAST TECHNIQUE: Multiplanar, multiecho pulse sequences of the brain and surrounding structures were obtained without and with intravenous contrast. CONTRAST:  53mL GADAVIST  GADOBUTROL 1 MMOL/ML IV SOLN COMPARISON:  08/31/2019 FINDINGS: There is motion artifact particularly on postcontrast imaging. Brain: There is suboptimal comparison with the prior study due to artifact. Enhancing lesions do not appear substantially changed. Several of the lesions are not identified, but this is probably secondary to small size in the setting of artifact. There is no definite new lesion. No new edema. There is no acute infarction or intracranial hemorrhage. Confluent areas of T2 hyperintensity in the supratentorial white matter likely reflects therapy related change. There is no hydrocephalus. Vascular: Major vessel flow voids at the skull base are preserved. Skull and upper cervical spine: Normal marrow signal is preserved. Sinuses/Orbits: Paranasal sinuses are aerated. Orbits are unremarkable. Other: Sella is unremarkable.  Mastoid air cells are clear. IMPRESSION: Suboptimal evaluation and comparison due to motion artifact. However, there is no evidence of new or progressive disease. Electronically Signed   By: Macy Mis M.D.   On: 11/02/2019 12:57   US Venous Img Lower Bilateral  Result Date: 11/01/2019 CLINICAL DATA:  64 year old female with leg pain EXAM: BILATERAL LOWER EXTREMITY VENOUS DOPPLER ULTRASOUND TECHNIQUE: Gray-scale sonography with graded compression, as well as color Doppler and duplex ultrasound were performed to evaluate the lower extremity deep venous systems from the level of the common femoral vein and including the common femoral, femoral, profunda femoral, popliteal and calf veins including the posterior tibial, peroneal and gastrocnemius veins when visible. The superficial great saphenous vein was also interrogated. Spectral Doppler was utilized to evaluate flow at rest and with distal augmentation maneuvers in the common femoral, femoral and popliteal veins. COMPARISON:  None. FINDINGS: RIGHT LOWER EXTREMITY Common Femoral Vein: Noncompressible. The lumen is expanded  and filled with heterogeneous internal echoes. No evidence of flow on color Doppler imaging. Findings are consistent with occlusive thrombus. Saphenofemoral Junction: Occlusive thrombus extends into the proximal saphenofemoral junction. Profunda Femoral Vein: Occlusive thrombus extends into the profunda femoral vein. Femoral Vein: Occlusive thrombus extends throughout the femoral vein in the thigh. Popliteal Vein: Occlusive thrombus extends through the popliteal vein. Calf Veins: Occlusive thrombus extends into the posterior tibial, peroneal and gastrocnemius veins. Superficial Great Saphenous Vein: Positive thrombus in the small saphenous vein. The great saphenous vein is patent and compressible. Venous Reflux:  None. Other Findings:  None. LEFT LOWER EXTREMITY Common Femoral Vein: Partially compressible. There is some eccentric wall thickening in the left common vein consistent with chronic DVT. Saphenofemoral Junction: Eccentric wall thickening extends into the saphenofemoral junction. Profunda Femoral Vein: Wall thickening extends into the profunda femoral vein. Femoral Vein: Wall thickening extends into the femoral vein. Popliteal Vein: The popliteal vein is only partially compressible. The lumen is expanded and filled with heterogeneous echoes. There is a segment of the vein without evidence of vascular flow on color Doppler consistent with occlusive DVT. Calf Veins: No evidence of thrombus. Normal compressibility and flow on color Doppler imaging. Superficial Great Saphenous Vein: Noncompressible. Echogenic material noted throughout the great saphenous vein. Venous Reflux:  None. Other Findings:  None. IMPRESSION: 1. Positive for extensive occlusive and likely acute DVT throughout the right lower extremity. 2. Positive for what appears to be a combination of acute  and chronic DVT in the left lower extremity. There is a relatively short segment of occlusive and likely acute DVT in the popliteal vein with  findings more consistent with chronic, or nonocclusive DVT in the remaining venous structures. Electronically Signed   By: Jacqulynn Cadet M.D.   On: 11/01/2019 14:36     Labs:   Basic Metabolic Panel: Recent Labs  Lab 10/31/19 1453 10/31/19 1453 11/01/19 1425 11/01/19 1425 11/02/19 0623 11/03/19 0901  NA 133*  --  132*  --  136 136  K 4.6   < > 4.8   < > 4.5 4.2  CL 97*  --  97*  --  101 101  CO2 22  --  25  --  25 21*  GLUCOSE 119*  --  118*  --  90 78  BUN 27*  --  31*  --  28* 23  CREATININE 1.23*  --  1.27*  --  0.98 0.74  CALCIUM 9.1  --  9.1  --  8.9 8.5*  MG  --   --  2.7*  --   --   --    < > = values in this interval not displayed.   GFR Estimated Creatinine Clearance: 73.4 mL/min (by C-G formula based on SCr of 0.74 mg/dL). Liver Function Tests: Recent Labs  Lab 10/31/19 1453 11/01/19 1425 11/02/19 0623  AST 43* 43* 44*  ALT 61* 53* 46*  ALKPHOS 261* 230* 193*  BILITOT 2.2* 2.2* 1.8*  PROT 7.5 7.0 7.1  ALBUMIN 3.8 3.9 3.8   Recent Labs  Lab 11/01/19 1425  LIPASE 29   No results for input(s): AMMONIA in the last 168 hours. Coagulation profile Recent Labs  Lab 11/01/19 1425  INR 1.2    CBC: Recent Labs  Lab 10/31/19 1453 10/31/19 1453 11/01/19 1425 11/01/19 1425 11/02/19 0623 11/03/19 0901 11/04/19 0204 11/04/19 0908 11/07/19 0822  WBC 3.1*   < > 2.8*   < > 2.2* 1.7* 1.3* 1.5* 2.4*  NEUTROABS 2.4  --  2.4  --   --   --   --   --  1.1*  HGB 11.9*   < > 10.5*   < > 10.3* 9.5* 8.3* 8.9* 9.9*  HCT 37.4   < > 32.2*   < > 31.0* 29.9* 25.0* 27.4* 32.2*  MCV 99.2   < > 97.9   < > 98.1 99.0 100.4* 100.7* 103.5*  PLT 192   < > 131*   < > 138* 149* 128* 140* 242   < > = values in this interval not displayed.   Cardiac Enzymes: No results for input(s): CKTOTAL, CKMB, CKMBINDEX, TROPONINI in the last 168 hours. BNP: Invalid input(s): POCBNP CBG: No results for input(s): GLUCAP in the last 168 hours. D-Dimer No results for input(s): DDIMER  in the last 72 hours. Hgb A1c No results for input(s): HGBA1C in the last 72 hours. Lipid Profile No results for input(s): CHOL, HDL, LDLCALC, TRIG, CHOLHDL, LDLDIRECT in the last 72 hours. Thyroid function studies No results for input(s): TSH, T4TOTAL, T3FREE, THYROIDAB in the last 72 hours.  Invalid input(s): FREET3 Anemia work up No results for input(s): VITAMINB12, FOLATE, FERRITIN, TIBC, IRON, RETICCTPCT in the last 72 hours. Microbiology Recent Results (from the past 240 hour(s))  Respiratory Panel by RT PCR (Flu A&B, Covid) - Nasopharyngeal Swab     Status: None   Collection Time: 11/01/19  3:53 PM   Specimen: Nasopharyngeal Swab  Result Value Ref Range  Status   SARS Coronavirus 2 by RT PCR NEGATIVE NEGATIVE Final    Comment: (NOTE) SARS-CoV-2 target nucleic acids are NOT DETECTED. The SARS-CoV-2 RNA is generally detectable in upper respiratoy specimens during the acute phase of infection. The lowest concentration of SARS-CoV-2 viral copies this assay can detect is 131 copies/mL. A negative result does not preclude SARS-Cov-2 infection and should not be used as the sole basis for treatment or other patient management decisions. A negative result may occur with  improper specimen collection/handling, submission of specimen other than nasopharyngeal swab, presence of viral mutation(s) within the areas targeted by this assay, and inadequate number of viral copies (<131 copies/mL). A negative result must be combined with clinical observations, patient history, and epidemiological information. The expected result is Negative. Fact Sheet for Patients:  PinkCheek.be Fact Sheet for Healthcare Providers:  GravelBags.it This test is not yet ap proved or cleared by the [place] FDA and  has been authorized for detection and/or diagnosis of SARS-CoV-2 by FDA under an Emergency Use Authorization (EUA). This EUA will remain    in effect (meaning this test can be used) for the duration of the COVID-19 declaration under Section 564(b)(1) of the Act, 21 U.S.C. section 360bbb-3(b)(1), unless the authorization is terminated or revoked sooner.    Influenza A by PCR NEGATIVE NEGATIVE Final   Influenza B by PCR NEGATIVE NEGATIVE Final    Comment: (NOTE) The Xpert Xpress SARS-CoV-2/FLU/RSV assay is intended as an aid in  the diagnosis of influenza from Nasopharyngeal swab specimens and  should not be used as a sole basis for treatment. Nasal washings and  aspirates are unacceptable for Xpert Xpress SARS-CoV-2/FLU/RSV  testing. Fact Sheet for Patients: PinkCheek.be Fact Sheet for Healthcare Providers: GravelBags.it This test is not yet approved or cleared by the [place] FDA and  has been authorized for detection and/or diagnosis of SARS-CoV-2 by  FDA under an Emergency Use Authorization (EUA). This EUA will remain  in effect (meaning this test can be used) for the duration of the  Covid-19 declaration under Section 564(b)(1) of the Act, 21  U.S.C. section 360bbb-3(b)(1), unless the authorization is  terminated or revoked. Performed at [facility], [address], [place], [place] [address]      Discharge Instructions:   Discharge Instructions    Diet - low sodium heart healthy   Complete by: As directed    Increase activity slowly   Complete by: As directed      Allergies as of 11/07/2019      Reactions   No Known Allergies       Medication List    STOP taking these medications   fluconazole 200 MG tablet Commonly known as: DIFLUCAN     TAKE these medications   apixaban 5 MG Tabs tablet Commonly known as: ELIQUIS Take 2 tablets (10 mg total) by mouth 2 (two) times daily for 3 days.   apixaban 5 MG Tabs tablet Commonly known as: ELIQUIS Take 1 tablet (5 mg total) by mouth 2 (two) times daily. Start taking on: November 11, 2019   flecainide 50 MG tablet Commonly known as: TAMBOCOR Take 1 tablet (50 mg total) by mouth 2 (two) times daily.   ipratropium 0.03 % nasal spray Commonly known as: ATROVENT Place 2 sprays into both nostrils every 12 (twelve) hours.   levocetirizine 5 MG tablet Commonly known as: Xyzal Take 1 tablet (5 mg total) by mouth every evening.   magnesium oxide 400 MG tablet  Commonly known as: MAG-OX Take 1 tablet (400 mg total) by mouth 2 (two) times daily.   meclizine 25 MG tablet Commonly known as: ANTIVERT Take 1 tablet (25 mg total) by mouth 3 (three) times daily as needed for dizziness.   metoprolol tartrate 25 MG tablet Commonly known as: LOPRESSOR Take 1 tablet (25 mg total) by mouth 2 (two) times daily.   montelukast 10 MG tablet Commonly known as: Singulair Take 1 tablet (10 mg total) by mouth at bedtime.   pregabalin 100 MG capsule Commonly known as: Lyrica Take 1 capsule (100 mg total) by mouth 2 (two) times daily.   Vitamin D (Ergocalciferol) 1.25 MG (50000 UNIT) Caps capsule Commonly known as: DRISDOL TAKE 1 CAPSULE (50,000 UNITS TOTAL) ONCE A WEEK BY MOUTH. What changed: See the new instructions.      Contact information for after-discharge care    Destination    [facility] Preferred SNF .   Service: Skilled Nursing Contact information: [address] [place] [phone]               Time coordinating discharge: 28 minutes  Signed:  Jennye Boroughs  [facility] 11/07/2019, 2:14 PM

## 2019-11-07 NOTE — Plan of Care (Signed)
  Problem: Education: Goal: Knowledge of General Education information will improve Description: Including pain rating scale, medication(s)/side effects and non-pharmacologic comfort measures 11/07/2019 0356 by Marland Mcalpine, RN Outcome: Progressing 11/07/2019 0354 by Marland Mcalpine, RN Outcome: Progressing 11/07/2019 0352 by Marland Mcalpine, RN Outcome: Progressing   Problem: Consults Goal: Venous Thromboembolism Patient Education Description: See Patient Education Module for education specifics. 11/07/2019 0356 by Marland Mcalpine, RN Outcome: Progressing 11/07/2019 0354 by Marland Mcalpine, RN Outcome: Progressing 11/07/2019 0352 by Marland Mcalpine, RN Outcome: Progressing Goal: Diagnosis - Venous Thromboembolism (VTE) Description: Choose a selection 11/07/2019 0356 by Marland Mcalpine, RN Outcome: Progressing 11/07/2019 0354 by Marland Mcalpine, RN Outcome: Progressing 11/07/2019 0352 by Marland Mcalpine, RN Outcome: Progressing   Problem: Skin Integrity: Goal: Risk for impaired skin integrity will decrease 11/07/2019 0356 by Marland Mcalpine, RN Outcome: Progressing 11/07/2019 0354 by Marland Mcalpine, RN Outcome: Progressing 11/07/2019 0352 by Marland Mcalpine, RN Outcome: Progressing

## 2019-11-07 NOTE — Telephone Encounter (Signed)
C-please have the patient follow-up with me this Friday; 2/12-a.m.; MD labs CBC CMP; Adriamycin chemotherapy

## 2019-11-07 NOTE — TOC Progression Note (Signed)
Transition of Care [facility]) - Progression Note    Patient Details  Name: Sarah Horne MRN: [id number] Date of Birth: 06-17-1956  Transition of Care [facility]) CM/SW [place], RN Phone Number: 11/07/2019, 4:29 PM  Clinical Narrative:     I notified the bedside nurse of the Covid test results being back and the patient being ready for DC via secure chat.  I also notified the Charge nurse of this  Expected Discharge Plan: [place] Barriers to Discharge: Barriers Resolved  Expected Discharge Plan and Services Expected Discharge Plan: [place] In-house Referral: Clinical Social Work Discharge Planning Services: CM Consult   Living arrangements for the past 2 months: Single Family Home Expected Discharge Date: 11/07/19                                     Social Determinants of Health (SDOH) Interventions    Readmission Risk Interventions No flowsheet data found.

## 2019-11-07 NOTE — Progress Notes (Signed)
Called report to [place] at [facility]. Answered all questions. EMS called for transport.

## 2019-11-07 NOTE — TOC Progression Note (Signed)
Transition of Care [facility]) - Progression Note    Patient Details  Name: Sarah Horne MRN: [id number] Date of Birth: [birth year]  Transition of Care [facility]) CM/SW Contact  Su Hilt, RN Phone Number: 11/07/2019, 11:56 AM  Clinical Narrative:    Notified Charge Nurse Anderson [place] that a Rapid Covid needs to be done to DC today, she will notify the [facility]   Expected Discharge Plan: [place] Barriers to Discharge: Continued Medical Work up  Expected Discharge Plan and Services Expected Discharge Plan: [place] In-house Referral: Clinical Social Work Discharge Planning Services: CM Consult   Living arrangements for the past 2 months: Single Family Home                                       Social Determinants of Health (SDOH) Interventions    Readmission Risk Interventions No flowsheet data found.

## 2019-11-07 NOTE — TOC Transition Note (Signed)
Transition of Care [facility]) - CM/SW Discharge Note   Patient Details  Name: DORIE OHMS MRN: [id number] Date of Birth: 1955/12/12  [facility]) CM/SW Contact:  Su Hilt, RN Phone Number: 11/07/2019, 2:25 PM   Clinical Narrative:    The patient is to dischsrge today to [facility] via EMS transport. The patient is awaiting a Rapid Covid test results to come back and then will DC The bedside nurse to call report to [facility] and to call EMS for transport once ready, The DC summary order and Transfer report sent to the facility via the Hub, the patient's brother [name] was called and a VM left about the DC. I left my contact information for a return call.   Final next level of care: Skilled Nursing Facility Barriers to Discharge: Barriers Resolved   Patient Goals and CMS Choice Patient states their goals for this hospitalization and ongoing recovery are:: Hope to feel better and get back home      Discharge Placement              Patient chooses bed at: [facility] [place] Patient to be transferred to facility by: EMS Name of family member notified: [name] Brother Patient and family notified of of transfer: 11/07/19  Discharge Plan and Services In-house Referral: Clinical Social Work Discharge Planning Services: [employer] Consult                                 Social Determinants of Health (SDOH) Interventions     Readmission Risk Interventions No flowsheet data found.

## 2019-11-07 NOTE — Progress Notes (Signed)
Sarah Horne   DOB:28-Jan-1956   W[place]#:[id number]    Subjective: Patient had a good night.  Denies any pain.  Denies any headaches.  Lower extremity pain improved  Objective:  Vitals:   11/07/19 1144 11/07/19 1513  BP: 105/73 107/75  Pulse: 98 90  Resp: 17 18  Temp:  98.6 F (37 C)  SpO2: 100% 100%     Intake/Output Summary (Last 24 hours) at 11/07/2019 2047 Last data filed at 11/07/2019 1700 Gross per 24 hour  Intake 720 ml  Output --  Net 720 ml    Physical Exam  Constitutional: She is oriented to person, place, and time and well-developed, well-nourished, and in no distress.  HENT:  Head: Normocephalic and atraumatic.  Mouth/Throat: Oropharynx is clear and moist. No oropharyngeal exudate.  Eyes: Pupils are equal, round, and reactive to light.  Cardiovascular: Normal rate and regular rhythm.  Pulmonary/Chest: Effort normal and breath sounds normal. No respiratory distress. She has no wheezes.  Abdominal: Soft. Bowel sounds are normal. She exhibits no distension and no mass. There is no abdominal tenderness. There is no rebound and no guarding.  Musculoskeletal:        General: No tenderness or edema. Normal range of motion.     Cervical back: Normal range of motion and neck supple.  Neurological: She is alert and oriented to person, place, and time.  Skin: Skin is warm.  Psychiatric: Affect normal.     Labs:  Lab Results  Component Value Date   WBC 2.4 (L) 11/07/2019   HGB 9.9 (L) 11/07/2019   HCT 32.2 (L) 11/07/2019   MCV 103.5 (H) 11/07/2019   PLT 242 11/07/2019   NEUTROABS 1.1 (L) 11/07/2019    Lab Results  Component Value Date   NA 136 11/03/2019   K 4.2 11/03/2019   CL 101 11/03/2019   CO2 21 (L) 11/03/2019    Studies:  No results found.  Carcinoma of upper-outer quadrant of left breast in female, estrogen receptor positive ([place]) #64 year old female patient with metastatic breast cancer status post multiple lines of therapy currently admitted to  hospital for right lower extremity swelling/acute DVT; and also dysphagia.  #Recurrent metastatic breast cancer ER/PR positive HER-2 negative-metastasis to the liver/brain-most recently on Adriamycin.  Currently on hold because of acute illness; plan to resume chemotherapy next week in the clinic.  #Right lower extremity DVT status post thrombectomy-on anticoagulation-Eliquis.  Patient will need indefinite Eliquis; STABLE.   #Leukopenia white count 1.5 unclear etiology-predominantly lymphopenia monitor for now; STABLE.   #CODE STATUS-DNR/DNI.  I again reiterated the importance of not driving-given the concern for slow reflexes; and history of brain mets.  #Disposition-awaiting discharge to rehab.  We will have the patient follow-up in the clinic later in the week to proceed with chemotherapy.    Cammie Sickle, MD 11/07/2019  8:47 PM

## 2019-11-07 NOTE — Progress Notes (Signed)
D: Pt alert and oriented x 4. Pt denies experiencing any pain at this time.   A: Pt received discharge and medication education/information. Pt belongings were gathered and taken with them upon transport to [facility].   R: Pt verbalized understanding of discharge and medication education/information.  Pt escorted at this time by EMS to [facility].

## 2019-11-08 ENCOUNTER — Telehealth: Payer: Self-pay | Admitting: Emergency Medicine

## 2019-11-09 ENCOUNTER — Other Ambulatory Visit: Payer: Self-pay

## 2019-11-09 DIAGNOSIS — Z7901 Long term (current) use of anticoagulants: Secondary | ICD-10-CM | POA: Diagnosis not present

## 2019-11-09 DIAGNOSIS — J302 Other seasonal allergic rhinitis: Secondary | ICD-10-CM | POA: Diagnosis not present

## 2019-11-09 DIAGNOSIS — G9009 Other idiopathic peripheral autonomic neuropathy: Secondary | ICD-10-CM | POA: Diagnosis not present

## 2019-11-09 DIAGNOSIS — I2510 Atherosclerotic heart disease of native coronary artery without angina pectoris: Secondary | ICD-10-CM | POA: Diagnosis not present

## 2019-11-09 DIAGNOSIS — C50412 Malignant neoplasm of upper-outer quadrant of left female breast: Secondary | ICD-10-CM | POA: Diagnosis not present

## 2019-11-09 DIAGNOSIS — I82401 Acute embolism and thrombosis of unspecified deep veins of right lower extremity: Secondary | ICD-10-CM | POA: Diagnosis not present

## 2019-11-10 ENCOUNTER — Inpatient Hospital Stay (HOSPITAL_BASED_OUTPATIENT_CLINIC_OR_DEPARTMENT_OTHER): Payer: Medicare HMO | Admitting: Internal Medicine

## 2019-11-10 ENCOUNTER — Other Ambulatory Visit: Payer: Self-pay

## 2019-11-10 ENCOUNTER — Inpatient Hospital Stay: Payer: Medicare HMO

## 2019-11-10 DIAGNOSIS — D72819 Decreased white blood cell count, unspecified: Secondary | ICD-10-CM | POA: Diagnosis not present

## 2019-11-10 DIAGNOSIS — Z5111 Encounter for antineoplastic chemotherapy: Secondary | ICD-10-CM | POA: Diagnosis not present

## 2019-11-10 DIAGNOSIS — R945 Abnormal results of liver function studies: Secondary | ICD-10-CM | POA: Diagnosis not present

## 2019-11-10 DIAGNOSIS — Z79818 Long term (current) use of other agents affecting estrogen receptors and estrogen levels: Secondary | ICD-10-CM | POA: Diagnosis not present

## 2019-11-10 DIAGNOSIS — C7931 Secondary malignant neoplasm of brain: Secondary | ICD-10-CM | POA: Diagnosis not present

## 2019-11-10 DIAGNOSIS — Z7189 Other specified counseling: Secondary | ICD-10-CM

## 2019-11-10 DIAGNOSIS — C50412 Malignant neoplasm of upper-outer quadrant of left female breast: Secondary | ICD-10-CM

## 2019-11-10 DIAGNOSIS — Z170 Estrogen receptor positive status [ER+]: Secondary | ICD-10-CM

## 2019-11-10 DIAGNOSIS — R7989 Other specified abnormal findings of blood chemistry: Secondary | ICD-10-CM | POA: Diagnosis not present

## 2019-11-10 DIAGNOSIS — Z9221 Personal history of antineoplastic chemotherapy: Secondary | ICD-10-CM | POA: Diagnosis not present

## 2019-11-10 LAB — CBC WITH DIFFERENTIAL/PLATELET
Abs Immature Granulocytes: 0.01 10*3/uL (ref 0.00–0.07)
Basophils Absolute: 0 10*3/uL (ref 0.0–0.1)
Basophils Relative: 0 %
Eosinophils Absolute: 0 10*3/uL (ref 0.0–0.5)
Eosinophils Relative: 0 %
HCT: 32.2 % — ABNORMAL LOW (ref 36.0–46.0)
Hemoglobin: 9.7 g/dL — ABNORMAL LOW (ref 12.0–15.0)
Immature Granulocytes: 0 %
Lymphocytes Relative: 20 %
Lymphs Abs: 0.6 10*3/uL — ABNORMAL LOW (ref 0.7–4.0)
MCH: 30.6 pg (ref 26.0–34.0)
MCHC: 30.1 g/dL (ref 30.0–36.0)
MCV: 101.6 fL — ABNORMAL HIGH (ref 80.0–100.0)
Monocytes Absolute: 0.7 10*3/uL (ref 0.1–1.0)
Monocytes Relative: 23 %
Neutro Abs: 1.7 10*3/uL (ref 1.7–7.7)
Neutrophils Relative %: 57 %
Platelets: 343 10*3/uL (ref 150–400)
RBC: 3.17 MIL/uL — ABNORMAL LOW (ref 3.87–5.11)
RDW: 19.3 % — ABNORMAL HIGH (ref 11.5–15.5)
WBC: 3 10*3/uL — ABNORMAL LOW (ref 4.0–10.5)
nRBC: 0 % (ref 0.0–0.2)

## 2019-11-10 LAB — COMPREHENSIVE METABOLIC PANEL
ALT: 31 U/L (ref 0–44)
AST: 40 U/L (ref 15–41)
Albumin: 3.4 g/dL — ABNORMAL LOW (ref 3.5–5.0)
Alkaline Phosphatase: 206 U/L — ABNORMAL HIGH (ref 38–126)
Anion gap: 9 (ref 5–15)
BUN: 12 mg/dL (ref 8–23)
CO2: 27 mmol/L (ref 22–32)
Calcium: 8.7 mg/dL — ABNORMAL LOW (ref 8.9–10.3)
Chloride: 102 mmol/L (ref 98–111)
Creatinine, Ser: 0.74 mg/dL (ref 0.44–1.00)
GFR calc Af Amer: >60 mL/min (ref >60)
GFR calc non Af Amer: >60 mL/min (ref >60)
Glucose, Bld: 91 mg/dL (ref 70–99)
Potassium: 3.9 mmol/L (ref 3.5–5.1)
Sodium: 138 mmol/L (ref 135–145)
Total Bilirubin: 1.1 mg/dL (ref 0.3–1.2)
Total Protein: 6.5 g/dL (ref 6.5–8.1)

## 2019-11-10 MED ORDER — PALONOSETRON HCL INJECTION 0.25 MG/5ML
0.2500 mg | Freq: Once | INTRAVENOUS | Status: AC
  Administered 2019-11-10: 10:00:00 0.25 mg via INTRAVENOUS
  Filled 2019-11-10: qty 5

## 2019-11-10 MED ORDER — SODIUM CHLORIDE 0.9 % IV SOLN
4.0000 mg | Freq: Once | INTRAVENOUS | Status: DC
  Filled 2019-11-10: qty 0.4

## 2019-11-10 MED ORDER — SODIUM CHLORIDE 0.9% FLUSH
10.0000 mL | Freq: Once | INTRAVENOUS | Status: AC
  Administered 2019-11-10: 10 mL via INTRAVENOUS
  Filled 2019-11-10: qty 10

## 2019-11-10 MED ORDER — HEPARIN SOD (PORK) LOCK FLUSH 100 UNIT/ML IV SOLN
500.0000 [IU] | Freq: Once | INTRAVENOUS | Status: AC | PRN
  Administered 2019-11-10: 11:00:00 500 [IU]
  Filled 2019-11-10: qty 5

## 2019-11-10 MED ORDER — SODIUM CHLORIDE 0.9 % IV SOLN: 4.0000 mg | Freq: Once | INTRAVENOUS | Status: DC

## 2019-11-10 MED ORDER — SODIUM CHLORIDE 0.9 % IV SOLN
Freq: Once | INTRAVENOUS | Status: AC
  Filled 2019-11-10: qty 250

## 2019-11-10 MED ORDER — DOXORUBICIN HCL CHEMO IV INJECTION 2 MG/ML
20.0000 mg/m2 | Freq: Once | INTRAVENOUS | Status: AC
  Administered 2019-11-10: 11:00:00 38 mg via INTRAVENOUS
  Filled 2019-11-10: qty 19

## 2019-11-10 MED ORDER — HEPARIN SOD (PORK) LOCK FLUSH 100 UNIT/ML IV SOLN
500.0000 [IU] | Freq: Once | INTRAVENOUS | Status: AC
  Filled 2019-11-10: qty 5

## 2019-11-10 MED ORDER — HEPARIN SOD (PORK) LOCK FLUSH 100 UNIT/ML IV SOLN
INTRAVENOUS | Status: AC
  Filled 2019-11-10: qty 5

## 2019-11-10 MED ORDER — DEXAMETHASONE SODIUM PHOSPHATE 10 MG/ML IJ SOLN
4.0000 mg | Freq: Once | INTRAMUSCULAR | Status: AC
  Administered 2019-11-10: 10:00:00 4 mg via INTRAVENOUS
  Filled 2019-11-10: qty 1

## 2019-11-10 MED ORDER — DEXAMETHASONE SODIUM PHOSPHATE 10 MG/ML IJ SOLN: 4.0000 mg | Freq: Once | INTRAMUSCULAR | Status: AC

## 2019-11-10 NOTE — Progress Notes (Signed)
[facility] OFFICE PROGRESS NOTE  Patient Care Team: Delsa Grana, PA-C as PCP - General (Family Medicine) Wellington Hampshire, MD as PCP - Cardiology (Cardiology) Byrnett, Forest Gleason, MD (General Surgery) [name], MD (Inactive) as Attending Physician (Internal Medicine)  Cancer Staging No matching staging information was found for the patient.   Oncology History Overview Note  # LEFT BREAST IDC; STAGE II [cT2N1] ER >90%; PR- 50-90%; her 2 NEU- POS; s/p Neoadj chemo; AUG 2016- TCH+P s/p Lumpec & partial ALND- path CR; s/p RT [finished Nov 2016];  adj Herceptin; HELD for Jan 19th 2017 [in DEC 2016-EF dropped from 63 to 51%; FEB 27th EF-42.9%]; JAN 2016 START Arimidex; May 2017- EF- 60%; May 24th 2017-Re-start Herceptin q 3W; July 28th STOP herceptin [finished 48m/m2; sec to Low EF; however 2017 Oct EF= 67%; improved]  # DEC 4th 2017- Start Neratinib 4 pills; DEC 11th 3 pills; STOPPED.  # FEB 2018- RECURRENCE ER/PR positive; Her 2 NEGATIVE [liver Bx]  # MARCH 1st 2018- Tax-Cytoxan [poor tol]  # FEB 2018- Brain mets [SBRT; Dr.Crystal; finished Feb 28th 2018]  # March 2018- Eribulin- poor tolerance  # June 8th 2018- Started Faslodex + Abema [abema-multiple interruptions sec to neutropenia]  # MRI Brain- March 2019- Leptomeningeal mets [WBRT; No LP s/p WBRT- 01/09/2018]; STOP Abema [566mday-sec to severe cytopenia]+faslodex  # MAY 20tth 019- Start Xeloda; STOPPED in Nov 22nd 2019- sec to Progressive liver lesions on CT scan  # NOV 26th 2019- Faslodex + Piqaray 25049m; Jan 2020- Piqray 300 mg+ faslodex; FEB 17th CT scan- mixed response/ Overall progression; STOP Piqray + faslodex.   # FEB 25th 2020- ERIBULIN; Poor tolerance sec to severe neutropenia.  Stopped May 2020.  CT scan May 2020-new/progressive 2 cm liver lesion;   #July nd 2020-Ixempra x5 cycles-; NOV 2020-MRI liver- worsening liver lesions [rising AST/ALT].   # NOV [id number] weekly [consent-];  December 31-2020-progression/rising LFTs.  Stop Taxol  #January 15th, 2021-Adriamycin weekly [C]  #May 06, 2019-MRI brain 5 new lesions [3 mm to 10 mm]-asymptomatic; [previous whole brain radiation]- OCT 2020- Re-Irradiation.   # PN- G-2; may 2017- Cymbalta 5m71mMARCH 2018-  acute vascular insuff of BIL LE [? Taxotere vasoconstriction on asprin]; # hemorragic shock LGIB [s/p colo; Dr.Wohl]  #  SVT- on flecainide.   # April 2016- Liver Bx- NEG;   # Drop in EF from Herceptin [recovered OCt 27th- EF 65%]  # BMD- jan 2017- osteopenia  # 2020- SEP-s/p Pallaitive care evaluation  -----------------------------------------------------------     [place]- Sep 4th 2018- PDL-1 0%/NEG for BRCA; MSI-Stable; Positive for PI3K; ccnd-1; FGF** -----------------------------------------------------------  Dx: Metastatic Breast cancer [ER/PR-Pos; her -2 NEG] Stage IV; goals: palliative Current treatment-Adriamycin [C]   Carcinoma of upper-outer quadrant of left breast in female, estrogen receptor positive (HCC)[place]/25/2020 - 01/17/2019 Chemotherapy   The patient had eriBULin mesylate (HALAVEN) 2.1 mg in sodium chloride 0.9 % 100 mL chemo infusion, 1.2 mg/m2 = 2.1 mg (100 % of original dose 1.2 mg/m2), Intravenous,  Once, 3 of 4 cycles Dose modification: 1.2 mg/m2 (original dose 1.2 mg/m2, Cycle 1, Reason: Provider Judgment), 0.8 mg/m2 (original dose 1.2 mg/m2, Cycle 3, Reason: Provider Judgment) Administration: 2.1 mg (11/22/2018), 2.1 mg (11/29/2018), 2.1 mg (12/20/2018), 1.4 mg (01/17/2019)  for chemotherapy treatment.    03/30/2019 - 07/12/2019 Chemotherapy   The patient had pegfilgrastim (NEULASTA ONPRO KIT) injection 6 mg, 6 mg, Subcutaneous, Once, 5 of 8 cycles Administration: 6 mg (03/30/2019),  6 mg (04/20/2019), 6 mg (05/11/2019), 6 mg (06/22/2019), 6 mg (06/01/2019) ixabepilone (IXEMPRA) 60 mg in lactated ringers 250 mL chemo infusion, 33.5 mg/m2 = 57 mg (100 % of original dose  32 mg/m2), Intravenous,  Once, 5 of 8 cycles Dose modification: 32 mg/m2 (original dose 32 mg/m2, Cycle 1, Reason: Provider Judgment) Administration: 72 mg (04/20/2019), 72 mg (05/11/2019), 72 mg (06/22/2019), 72 mg (06/01/2019)  for chemotherapy treatment.    08/18/2019 - 09/28/2019 Chemotherapy   The patient had PACLitaxel (TAXOL) 150 mg in sodium chloride 0.9 % 250 mL chemo infusion (</= 47m/m2), 80 mg/m2 = 150 mg, Intravenous,  Once, 1 of 4 cycles Administration: 150 mg (08/18/2019), 150 mg (09/08/2019), 150 mg (09/15/2019)  for chemotherapy treatment.    10/13/2019 -  Chemotherapy   The patient had DOXOrubicin (ADRIAMYCIN) chemo injection 38 mg, 20 mg/m2 = 38 mg, Intravenous,  Once, 4 of 7 cycles Administration: 38 mg (10/13/2019), 38 mg (10/20/2019), 38 mg (10/27/2019), 38 mg (11/10/2019) palonosetron (ALOXI) injection 0.25 mg, 0.25 mg, Intravenous,  Once, 4 of 7 cycles Administration: 0.25 mg (10/13/2019), 0.25 mg (10/20/2019), 0.25 mg (10/27/2019), 0.25 mg (11/10/2019)  for chemotherapy treatment.      INTERVAL HISTORY:  BSAINA [id number].  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  In the interim patient was diagnosed with right lower external DVT needing thrombectomy.  She is currently on Eliquis.  She also had EGD that showed-s/p Schatzki's ring.  Patient is currently in rehab.  Currently working with physical therapy.  Denies any nausea vomiting P denies any weight loss.  Chronic tingling and numbness in extremities.    Review of Systems  Constitutional: Positive for malaise/fatigue. Negative for chills, diaphoresis, fever and weight loss.  HENT: Negative for nosebleeds and sore throat.   Eyes: Negative for double vision.  Respiratory: Negative for cough, hemoptysis, sputum production, shortness of breath and wheezing.   Cardiovascular: Negative  for chest pain, palpitations, orthopnea and leg swelling.  Gastrointestinal: Negative for abdominal pain, blood in stool, constipation, heartburn, melena, nausea and vomiting.  Genitourinary: Negative for dysuria, frequency and urgency.  Musculoskeletal: Positive for joint pain. Negative for back pain.  Skin: Negative for itching.  Neurological: Positive for tingling. Negative for dizziness, focal weakness, weakness and headaches.  Endo/Heme/Allergies: Does not bruise/bleed easily.  Psychiatric/Behavioral: Negative for depression. The patient is not nervous/anxious and does not have insomnia.     PAST MEDICAL HISTORY :  Past Medical History:  Diagnosis Date  . Chemotherapy-induced peripheral neuropathy ([place]   . Epistaxis    a. 11/2016 in setting of asa/plavix-->silver nitrate cauterization.  . GI bleed    a. 11/2016 Admission w/ GIB and hypovolemic shock req 3u PRBC's;  b. 11/2016 ECG: gastritis & nonbleeding peptic ulcer; c. 11/2016 Conlonoscopy: rectal and sigmoid colonic ulcers.  . Heart attack ([place]    a. 1998 Cath @ [facility]: reportedly no intervention required.  .Marland KitchenHerceptin-induced cardiomyopathy ([place]    a. In the setting of Herceptin Rx for breast cancer (initiated 12/2014); b. 03/2015 MUGA EF 64%; b. 08/2015 MUGA: EF 51%; c. 10/2015 MUGA: EF 44%; d. 11/2015 Echo: EF 45-50%; e. 01/2016 MUGA: EF 60%; f. 06/2016 MUGA EF 65%; g. 10/2016 MUGA: EF 61%;  h. 12/2016 Echo: EF 55-60%, gr1 DD.  .Marland KitchenHyperlipidemia   . Hypertension   . Neuropathy   . Possible PAD (peripheral artery disease) ([place]ven[place]  a. 11/2016 LE cyanosis and weak pulses-->CTA w/o significant Ao-BiFem dzs. ? distal dzs-->ASA/Plavix initiated by vascular surgery.  Marland Kitchen PSVT (paroxysmal supraventricular tachycardia) ([place])    a. Dx 11/2016.  . Pulmonary embolism ([place])    a. 12/2016 CTA Chest: small nonocclusive PE in inferior segment of the Left lingula, somewhat eccentric filling defect suggesting chronic rather than acute embolic event; b. 01/3298  LE U/S:  No DVT; c. 12/2016 Echo: Nl RV fxn, nl PASP.  Marland Kitchen Recurrent Metastatic breast cancer ([place])    a. Dx 2016: Stage II, ER positive, PR positive, HER-2/neu overexpressing of the left breast-->chemo/radiation; b. 10/2016 CT Abd/pelvis: diffuse liver mets, ill defined sclerotic bone lesions-T12;  c. 10/2016 MRI brain: metastatic lesion along L temporal lobe (19x45m) w/ extensive surrounding edema & 544mmidline shift to right.  . Sepsis ([place]  . Sinus tachycardia     PAST SURGICAL HISTORY :   Past Surgical History:  Procedure Laterality Date  . BREAST BIOPSY Left 2016   Positive  . BREAST LUMPECTOMY WITH SENTINEL LYMPH NODE BIOPSY Left 05/23/2015   Procedure: LEFT BREAST WIDE EXCISION WITH AXILLARY DISSECTION, MASTOPLASTY ;  Surgeon: JeRobert BellowMD;  Location: [facility] ORS;  Service: General;  Laterality: Left;  . BREAST SURGERY Left 12/18/14   breast biopsy/INVASIVE DUCTAL CARCINOMA OF BREAST, NOTTINGHAM GRADE 2.  . Marland KitchenREAST SURGERY  05/23/2015.   Wide excision/mastoplasty, axillary dissection. No residual invasive cancer, positive for residual DCIS. 0/2 nodes identified on axillary dissection. (no SLN by technetium or methylene blue)  . CARDIAC CATHETERIZATION    . COLONOSCOPY WITH PROPOFOL N/A 12/10/2016   Procedure: COLONOSCOPY WITH PROPOFOL;  Surgeon: DaLucilla LameMD;  Location: [facility] ENDOSCOPY;  Service: Endoscopy;  Laterality: N/A;  . COLONOSCOPY WITH PROPOFOL N/A 02/13/2017   Procedure: COLONOSCOPY WITH PROPOFOL;  Surgeon: WoLucilla LameMD;  [facility];  Service: Endoscopy;  Laterality: N/A;  . ESOPHAGOGASTRODUODENOSCOPY N/A 11/03/2019   Procedure: ESOPHAGOGASTRODUODENOSCOPY (EGD);  Surgeon: VaLin LandsmanMD;  [facility];  Service: Gastroenterology;  Laterality: N/A;  . ESOPHAGOGASTRODUODENOSCOPY (EGD) WITH PROPOFOL N/A 12/08/2016   Procedure: ESOPHAGOGASTRODUODENOSCOPY (EGD) WITH PROPOFOL;  Surgeon: DaLucilla LameMD;  Location: [facility] ENDOSCOPY;  Service:  Endoscopy;  Laterality: N/A;  . IVC FILTER INSERTION N/A 02/15/2017   Procedure: IVC Filter Insertion;  Surgeon: DeAlgernon HuxleyMD;  [facility];  Service: Cardiovascular;  Laterality: N/A;  . PORTACATH PLACEMENT Right 12-31-14   Dr ByBary Castilla  FAMILY HISTORY :   Family History  Problem Relation Age of Onset  . Breast cancer Maternal Aunt   . Breast cancer Cousin   . Brain cancer Maternal Uncle   . Diabetes Mother   . Hypertension Mother   . Stroke Mother     SOCIAL HISTORY:   Social History   Tobacco Use  . Smoking status: Light Tobacco Smoker    Packs/day: 0.50    Years: 18.00    Pack years: 9.00    Types: Cigarettes  . Smokeless tobacco: Never Used  Substance Use Topics  . Alcohol use: No    Alcohol/week: 0.0 standard drinks  . Drug use: No    ALLERGIES:  is allergic to no known allergies.  MEDICATIONS:  Current Outpatient Medications  Medication Sig Dispense Refill  . apixaban (ELIQUIS) 5 MG TABS tablet Take 2 tablets (10 mg total) by mouth 2 (two) times daily for 3 days. 60 tablet   . flecainide (TAMBOCOR) 50 MG tablet Take 1 tablet (  50 mg total) by mouth 2 (two) times daily. 60 tablet 4  . ipratropium (ATROVENT) 0.03 % nasal spray Place 2 sprays into both nostrils every 12 (twelve) hours. 30 mL 12  . levocetirizine (XYZAL) 5 MG tablet Take 1 tablet (5 mg total) by mouth every evening. 30 tablet 2  . magnesium oxide (MAG-OX) 400 MG tablet Take 1 tablet (400 mg total) by mouth 2 (two) times daily. 60 tablet 5  . meclizine (ANTIVERT) 25 MG tablet Take 1 tablet (25 mg total) by mouth 3 (three) times daily as needed for dizziness. 30 tablet 0  . metoprolol tartrate (LOPRESSOR) 25 MG tablet Take 1 tablet (25 mg total) by mouth 2 (two) times daily. 60 tablet 1  . montelukast (SINGULAIR) 10 MG tablet Take 1 tablet (10 mg total) by mouth at bedtime. 60 tablet 3  . pregabalin (LYRICA) 100 MG capsule Take 1 capsule (100 mg total) by mouth 2 (two) times daily.  60 capsule 2  . Vitamin D, Ergocalciferol, (DRISDOL) 1.25 MG (50000 UNIT) CAPS capsule TAKE 1 CAPSULE (50,000 UNITS TOTAL) ONCE A WEEK BY MOUTH. (Patient taking differently: Take 50,000 Units by mouth every 7 (seven) days. ) 12 capsule 1  . [START ON 11/11/2019] apixaban (ELIQUIS) 5 MG TABS tablet Take 1 tablet (5 mg total) by mouth 2 (two) times daily. (Patient not taking: Reported on 11/10/2019) 60 tablet    No current facility-administered medications for this visit.   Facility-Administered Medications Ordered in Other Visits  Medication Dose Route Frequency Provider Last Rate Last Admin  . 0.9 %  sodium chloride infusion   Intravenous Continuous [name], MD   New Bag at 12/10/16 1140  . 0.9 %  sodium chloride infusion   Intravenous Continuous Lloyd Huger, MD 999 mL/hr at 04/24/15 1520 New Bag at 05/23/15 0951  . 0.9 %  sodium chloride infusion   Intravenous Continuous Verlon Au, NP 999 mL/hr at 10/31/19 1522 New Bag at 10/31/19 1522  . heparin lock flush 100 unit/mL  500 Units Intravenous Once Berenzon, Dmitriy, MD      . sodium chloride 0.9 % injection 10 mL  10 mL Intravenous PRN [name], MD   10 mL at 04/04/15 1440  . sodium chloride flush (NS) 0.9 % injection 10 mL  10 mL Intravenous PRN Berenzon, Dmitriy, MD        PHYSICAL EXAMINATION: ECOG PERFORMANCE STATUS: 1 - Symptomatic but completely ambulatory  BP 112/76   Pulse 92   Temp (!) 95.8 F (35.4 C) (Tympanic)   Resp 16   Ht '5\' 6"'$  (1.676 m)   Wt 166 lb (75.3 kg)   BMI 26.79 kg/m   Filed Weights   11/10/19 0848  Weight: 166 lb (75.3 kg)    Physical Exam  Constitutional: She is oriented to person, place, and time and well-developed, well-nourished, and in no distress.  Patient in wheelchair.  She is accompanied by her driver/from [employer]  HENT:  Head: Normocephalic and atraumatic.  Mouth/Throat: Oropharynx is clear and moist. No oropharyngeal exudate.  Eyes: Pupils are equal, round,  and reactive to light.  Cardiovascular: Normal rate and regular rhythm.  Pulmonary/Chest: Effort normal and breath sounds normal. No respiratory distress. She has no wheezes.  Abdominal: Soft. Bowel sounds are normal. She exhibits no distension and no mass. There is no abdominal tenderness. There is no rebound and no guarding.  Musculoskeletal:        General: Edema present. No tenderness. Normal range  of motion.     Cervical back: Normal range of motion and neck supple.  Neurological: She is alert and oriented to person, place, and time.  Skin: Skin is warm.  Psychiatric: Affect normal.    LABORATORY DATA:  I have reviewed the data as listed    Component Value Date/Time   NA 138 11/10/2019 0822   NA 135 03/04/2017 0918   NA 135 01/08/2015 0850   K 3.9 11/10/2019 0822   K 3.7 01/08/2015 0850   CL 102 11/10/2019 0822   CL 101 01/08/2015 0850   CO2 27 11/10/2019 0822   CO2 26 01/08/2015 0850   GLUCOSE 91 11/10/2019 0822   GLUCOSE 161 (H) 01/08/2015 0850   BUN 12 11/10/2019 0822   BUN 8 03/04/2017 0918   BUN 17 01/08/2015 0850   CREATININE 0.74 11/10/2019 0822   CREATININE 0.82 01/22/2015 1559   CALCIUM 8.7 (L) 11/10/2019 0822   CALCIUM 9.3 01/08/2015 0850   PROT 6.5 11/10/2019 0822   PROT 6.4 03/04/2017 0918   PROT 6.8 01/22/2015 1559   ALBUMIN 3.4 (L) 11/10/2019 0822   ALBUMIN 2.5 (L) 03/04/2017 0918   ALBUMIN 3.9 01/22/2015 1559   AST 40 11/10/2019 0822   AST 34 01/22/2015 1559   ALT 31 11/10/2019 0822   ALT 42 01/22/2015 1559   ALKPHOS 206 (H) 11/10/2019 0822   ALKPHOS 157 (H) 01/22/2015 1559   BILITOT 1.1 11/10/2019 0822   BILITOT 6.2 (H) 03/04/2017 0918   BILITOT 0.2 (L) 01/22/2015 1559   GFRNONAA >60 11/10/2019 0822   GFRNONAA >60 01/22/2015 1559   GFRAA >60 11/10/2019 0822   GFRAA >60 01/22/2015 1559    No results found for: SPEP, UPEP  Lab Results  Component Value Date   WBC 3.0 (L) 11/10/2019   NEUTROABS 1.7 11/10/2019   HGB 9.7 (L) 11/10/2019    HCT 32.2 (L) 11/10/2019   MCV 101.6 (H) 11/10/2019   PLT 343 11/10/2019      Chemistry      Component Value Date/Time   NA 138 11/10/2019 0822   NA 135 03/04/2017 0918   NA 135 01/08/2015 0850   K 3.9 11/10/2019 0822   K 3.7 01/08/2015 0850   CL 102 11/10/2019 0822   CL 101 01/08/2015 0850   CO2 27 11/10/2019 0822   CO2 26 01/08/2015 0850   BUN 12 11/10/2019 0822   BUN 8 03/04/2017 0918   BUN 17 01/08/2015 0850   CREATININE 0.74 11/10/2019 0822   CREATININE 0.82 01/22/2015 1559      Component Value Date/Time   CALCIUM 8.7 (L) 11/10/2019 0822   CALCIUM 9.3 01/08/2015 0850   ALKPHOS 206 (H) 11/10/2019 0822   ALKPHOS 157 (H) 01/22/2015 1559   AST 40 11/10/2019 0822   AST 34 01/22/2015 1559   ALT 31 11/10/2019 0822   ALT 42 01/22/2015 1559   BILITOT 1.1 11/10/2019 0822   BILITOT 6.2 (H) 03/04/2017 0918   BILITOT 0.2 (L) 01/22/2015 1559       RADIOGRAPHIC STUDIES: I have personally reviewed the radiological images as listed and agreed with the findings in the report. No results found.   ASSESSMENT & PLAN:  Carcinoma of upper-outer quadrant of left breast in female, estrogen receptor positive ([place]) # Recurrent metastatic breast cancer-ER PR positive HER-2 negative; NOV 2020- PROGRESSION in liver- multiple enhancing lesions noted.  On ADRIAMYCIN weekly.  # Proceed with Adriamycin weekly dose cycle #4 today. Labs today reviewed;  acceptable for treatment today-however  LFTs elevated.   # Dysphagia-secondary to Schatzki's ring-s/p EGD dilatation improved.  # Sinus tachycardia- on metoprolol-/Tamabcor- STABLE.   # Elevated LFTs-likely secondary to progressive malignancy-monitor closely.  # Brain metastases; Recurrent-s/p RT finshed 11/17.  MRI- improved;-we will get MRI brain again in the next 1 to 2 months.  # Peripheral neuropathy grade 1-2-  on Lyrica; STABLE.   #Right lower extremity DVT-s/p thrombectomy; Eliquis stable.  Discussed the patient will need lifelong  anticoagulation.  # DISPOSITION:  # Adriamycin today.  # 1 week- MD; labs- cbc/cmp; ; adriamycin weekly- Dr.B   No orders of the defined types were placed in this encounter.  All questions were answered. The patient knows to call the clinic with any problems, questions or concerns.     Cammie Sickle, MD 11/10/2019 11:11 AM

## 2019-11-10 NOTE — Progress Notes (Signed)
Last Echo/[place] 12/2016. Per MD ok to treat.

## 2019-11-10 NOTE — Assessment & Plan Note (Addendum)
#  Recurrent metastatic breast cancer-ER PR positive HER-2 negative; NOV 2020- PROGRESSION in liver- multiple enhancing lesions noted.  On ADRIAMYCIN weekly.  # Proceed with Adriamycin weekly dose cycle #4 today. Labs today reviewed;  acceptable for treatment today-however LFTs elevated.   # Dysphagia-secondary to Schatzki's ring-s/p EGD dilatation improved.  # Sinus tachycardia- on metoprolol-/Tamabcor- STABLE.   # Elevated LFTs-likely secondary to progressive malignancy-monitor closely.  # Brain metastases; Recurrent-s/p RT finshed 11/17.  MRI- improved;-we will get MRI brain again in the next 1 to 2 months.  Again reviewed my recommendations not to drive given her overall debility.  # Peripheral neuropathy grade 1-2-  on Lyrica; STABLE.   #Right lower extremity DVT-s/p thrombectomy; Eliquis stable.  Discussed the patient will need lifelong anticoagulation.  # DISPOSITION:  # Adriamycin today.  # 1 week- MD; labs- cbc/cmp; ; adriamycin weekly- Dr.B

## 2019-11-16 DIAGNOSIS — Z7901 Long term (current) use of anticoagulants: Secondary | ICD-10-CM | POA: Diagnosis not present

## 2019-11-16 DIAGNOSIS — G9009 Other idiopathic peripheral autonomic neuropathy: Secondary | ICD-10-CM | POA: Diagnosis not present

## 2019-11-16 DIAGNOSIS — I82401 Acute embolism and thrombosis of unspecified deep veins of right lower extremity: Secondary | ICD-10-CM | POA: Diagnosis not present

## 2019-11-16 DIAGNOSIS — J302 Other seasonal allergic rhinitis: Secondary | ICD-10-CM | POA: Diagnosis not present

## 2019-11-16 DIAGNOSIS — C50412 Malignant neoplasm of upper-outer quadrant of left female breast: Secondary | ICD-10-CM | POA: Diagnosis not present

## 2019-11-16 DIAGNOSIS — I2510 Atherosclerotic heart disease of native coronary artery without angina pectoris: Secondary | ICD-10-CM | POA: Diagnosis not present

## 2019-11-17 ENCOUNTER — Inpatient Hospital Stay: Payer: Medicare HMO

## 2019-11-17 ENCOUNTER — Other Ambulatory Visit: Payer: Self-pay

## 2019-11-17 ENCOUNTER — Encounter: Payer: Self-pay | Admitting: Internal Medicine

## 2019-11-17 ENCOUNTER — Inpatient Hospital Stay (HOSPITAL_BASED_OUTPATIENT_CLINIC_OR_DEPARTMENT_OTHER): Payer: Medicare HMO | Admitting: Internal Medicine

## 2019-11-17 ENCOUNTER — Inpatient Hospital Stay (HOSPITAL_BASED_OUTPATIENT_CLINIC_OR_DEPARTMENT_OTHER): Payer: Medicare HMO | Admitting: Hospice and Palliative Medicine

## 2019-11-17 VITALS — BP 111/76 | HR 77 | Temp 97.0°F

## 2019-11-17 DIAGNOSIS — Z515 Encounter for palliative care: Secondary | ICD-10-CM | POA: Diagnosis not present

## 2019-11-17 DIAGNOSIS — D72819 Decreased white blood cell count, unspecified: Secondary | ICD-10-CM | POA: Diagnosis not present

## 2019-11-17 DIAGNOSIS — Z7189 Other specified counseling: Secondary | ICD-10-CM

## 2019-11-17 DIAGNOSIS — Z79818 Long term (current) use of other agents affecting estrogen receptors and estrogen levels: Secondary | ICD-10-CM | POA: Diagnosis not present

## 2019-11-17 DIAGNOSIS — Z9221 Personal history of antineoplastic chemotherapy: Secondary | ICD-10-CM | POA: Diagnosis not present

## 2019-11-17 DIAGNOSIS — R7989 Other specified abnormal findings of blood chemistry: Secondary | ICD-10-CM | POA: Diagnosis not present

## 2019-11-17 DIAGNOSIS — C50412 Malignant neoplasm of upper-outer quadrant of left female breast: Secondary | ICD-10-CM

## 2019-11-17 DIAGNOSIS — Z5111 Encounter for antineoplastic chemotherapy: Secondary | ICD-10-CM | POA: Diagnosis not present

## 2019-11-17 DIAGNOSIS — Z95828 Presence of other vascular implants and grafts: Secondary | ICD-10-CM

## 2019-11-17 DIAGNOSIS — Z170 Estrogen receptor positive status [ER+]: Secondary | ICD-10-CM

## 2019-11-17 DIAGNOSIS — R945 Abnormal results of liver function studies: Secondary | ICD-10-CM | POA: Diagnosis not present

## 2019-11-17 DIAGNOSIS — C7931 Secondary malignant neoplasm of brain: Secondary | ICD-10-CM | POA: Diagnosis not present

## 2019-11-17 LAB — COMPREHENSIVE METABOLIC PANEL
ALT: 30 U/L (ref 0–44)
AST: 44 U/L — ABNORMAL HIGH (ref 15–41)
Albumin: 3.4 g/dL — ABNORMAL LOW (ref 3.5–5.0)
Alkaline Phosphatase: 211 U/L — ABNORMAL HIGH (ref 38–126)
Anion gap: 10 (ref 5–15)
BUN: 10 mg/dL (ref 8–23)
CO2: 27 mmol/L (ref 22–32)
Calcium: 8.8 mg/dL — ABNORMAL LOW (ref 8.9–10.3)
Chloride: 103 mmol/L (ref 98–111)
Creatinine, Ser: 0.54 mg/dL (ref 0.44–1.00)
GFR calc Af Amer: >60 mL/min (ref >60)
GFR calc non Af Amer: >60 mL/min (ref >60)
Glucose, Bld: 104 mg/dL — ABNORMAL HIGH (ref 70–99)
Potassium: 3.9 mmol/L (ref 3.5–5.1)
Sodium: 140 mmol/L (ref 135–145)
Total Bilirubin: 0.8 mg/dL (ref 0.3–1.2)
Total Protein: 6.5 g/dL (ref 6.5–8.1)

## 2019-11-17 LAB — CBC WITH DIFFERENTIAL/PLATELET
Abs Immature Granulocytes: 0.03 10*3/uL (ref 0.00–0.07)
Basophils Absolute: 0 10*3/uL (ref 0.0–0.1)
Basophils Relative: 0 %
Eosinophils Absolute: 0 10*3/uL (ref 0.0–0.5)
Eosinophils Relative: 0 %
HCT: 32 % — ABNORMAL LOW (ref 36.0–46.0)
Hemoglobin: 9.9 g/dL — ABNORMAL LOW (ref 12.0–15.0)
Immature Granulocytes: 1 %
Lymphocytes Relative: 11 %
Lymphs Abs: 0.5 10*3/uL — ABNORMAL LOW (ref 0.7–4.0)
MCH: 31.5 pg (ref 26.0–34.0)
MCHC: 30.9 g/dL (ref 30.0–36.0)
MCV: 101.9 fL — ABNORMAL HIGH (ref 80.0–100.0)
Monocytes Absolute: 0.3 10*3/uL (ref 0.1–1.0)
Monocytes Relative: 6 %
Neutro Abs: 4.1 10*3/uL (ref 1.7–7.7)
Neutrophils Relative %: 82 %
Platelets: 338 10*3/uL (ref 150–400)
RBC: 3.14 MIL/uL — ABNORMAL LOW (ref 3.87–5.11)
RDW: 18.8 % — ABNORMAL HIGH (ref 11.5–15.5)
WBC: 5 10*3/uL (ref 4.0–10.5)
nRBC: 0 % (ref 0.0–0.2)

## 2019-11-17 MED ORDER — HEPARIN SOD (PORK) LOCK FLUSH 100 UNIT/ML IV SOLN
500.0000 [IU] | Freq: Once | INTRAVENOUS | Status: AC | PRN
  Administered 2019-11-17: 500 [IU]
  Filled 2019-11-17: qty 5

## 2019-11-17 MED ORDER — SODIUM CHLORIDE 0.9 % IV SOLN
Freq: Once | INTRAVENOUS | Status: AC
  Filled 2019-11-17: qty 250

## 2019-11-17 MED ORDER — SODIUM CHLORIDE 0.9% FLUSH
10.0000 mL | Freq: Once | INTRAVENOUS | Status: AC
  Administered 2019-11-17: 10:00:00 10 mL via INTRAVENOUS
  Filled 2019-11-17: qty 10

## 2019-11-17 MED ORDER — SODIUM CHLORIDE 0.9 % IV SOLN: 4.0000 mg | Freq: Once | INTRAVENOUS | Status: DC

## 2019-11-17 MED ORDER — DOXORUBICIN HCL CHEMO IV INJECTION 2 MG/ML
20.0000 mg/m2 | Freq: Once | INTRAVENOUS | Status: AC
  Administered 2019-11-17: 38 mg via INTRAVENOUS
  Filled 2019-11-17: qty 19

## 2019-11-17 MED ORDER — DEXAMETHASONE SODIUM PHOSPHATE 10 MG/ML IJ SOLN
4.0000 mg | Freq: Once | INTRAMUSCULAR | Status: AC
  Administered 2019-11-17: 4 mg via INTRAVENOUS
  Filled 2019-11-17: qty 1

## 2019-11-17 MED ORDER — PALONOSETRON HCL INJECTION 0.25 MG/5ML
0.2500 mg | Freq: Once | INTRAVENOUS | Status: AC
  Administered 2019-11-17: 0.25 mg via INTRAVENOUS
  Filled 2019-11-17: qty 5

## 2019-11-17 NOTE — Progress Notes (Signed)
[facility] OFFICE PROGRESS NOTE  Patient Care Team: Delsa Grana, PA-C as PCP - General (Family Medicine) Wellington Hampshire, MD as PCP - Cardiology (Cardiology) Byrnett, Forest Gleason, MD (General Surgery) [name], MD (Inactive) as Attending Physician (Internal Medicine)  Cancer Staging No matching staging information was found for the patient.   Oncology History Overview Note  # LEFT BREAST IDC; STAGE II [cT2N1] ER >90%; PR- 50-90%; her 2 NEU- POS; s/p Neoadj chemo; AUG 2016- TCH+P s/p Lumpec & partial ALND- path CR; s/p RT [finished Nov 2016];  adj Herceptin; HELD for Jan 19th 2017 [in DEC 2016-EF dropped from 63 to 51%; FEB 27th EF-42.9%]; JAN 2016 START Arimidex; May 2017- EF- 60%; May 24th 2017-Re-start Herceptin q 3W; July 28th STOP herceptin [finished 48m/m2; sec to Low EF; however 2017 Oct EF= 67%; improved]  # DEC 4th 2017- Start Neratinib 4 pills; DEC 11th 3 pills; STOPPED.  # FEB 2018- RECURRENCE ER/PR positive; Her 2 NEGATIVE [liver Bx]  # MARCH 1st 2018- Tax-Cytoxan [poor tol]  # FEB 2018- Brain mets [SBRT; Dr.Crystal; finished Feb 28th 2018]  # March 2018- Eribulin- poor tolerance  # June 8th 2018- Started Faslodex + Abema [abema-multiple interruptions sec to neutropenia]  # MRI Brain- March 2019- Leptomeningeal mets [WBRT; No LP s/p WBRT- 01/09/2018]; STOP Abema [566mday-sec to severe cytopenia]+faslodex  # MAY 20tth 019- Start Xeloda; STOPPED in Nov 22nd 2019- sec to Progressive liver lesions on CT scan  # NOV 26th 2019- Faslodex + Piqaray 25049m; Jan 2020- Piqray 300 mg+ faslodex; FEB 17th CT scan- mixed response/ Overall progression; STOP Piqray + faslodex.   # FEB 25th 2020- ERIBULIN; Poor tolerance sec to severe neutropenia.  Stopped May 2020.  CT scan May 2020-new/progressive 2 cm liver lesion;   #July nd 2020-Ixempra x5 cycles-; NOV 2020-MRI liver- worsening liver lesions [rising AST/ALT].   # NOV [id number] weekly [consent-];  December 31-2020-progression/rising LFTs.  Stop Taxol  #January 15th, 2021-Adriamycin weekly [C]  #May 06, 2019-MRI brain 5 new lesions [3 mm to 10 mm]-asymptomatic; [previous whole brain radiation]- OCT 2020- Re-Irradiation.   # PN- G-2; may 2017- Cymbalta 5m71mMARCH 2018-  acute vascular insuff of BIL LE [? Taxotere vasoconstriction on asprin]; # hemorragic shock LGIB [s/p colo; Dr.Wohl]  #  SVT- on flecainide.   # April 2016- Liver Bx- NEG;   # Drop in EF from Herceptin [recovered OCt 27th- EF 65%]  # BMD- jan 2017- osteopenia  # 2020- SEP-s/p Pallaitive care evaluation  -----------------------------------------------------------     [place]- Sep 4th 2018- PDL-1 0%/NEG for BRCA; MSI-Stable; Positive for PI3K; ccnd-1; FGF** -----------------------------------------------------------  Dx: Metastatic Breast cancer [ER/PR-Pos; her -2 NEG] Stage IV; goals: palliative Current treatment-Adriamycin [C]   Carcinoma of upper-outer quadrant of left breast in female, estrogen receptor positive (HCC)[place]/25/2020 - 01/17/2019 Chemotherapy   The patient had eriBULin mesylate (HALAVEN) 2.1 mg in sodium chloride 0.9 % 100 mL chemo infusion, 1.2 mg/m2 = 2.1 mg (100 % of original dose 1.2 mg/m2), Intravenous,  Once, 3 of 4 cycles Dose modification: 1.2 mg/m2 (original dose 1.2 mg/m2, Cycle 1, Reason: Provider Judgment), 0.8 mg/m2 (original dose 1.2 mg/m2, Cycle 3, Reason: Provider Judgment) Administration: 2.1 mg (11/22/2018), 2.1 mg (11/29/2018), 2.1 mg (12/20/2018), 1.4 mg (01/17/2019)  for chemotherapy treatment.    03/30/2019 - 07/12/2019 Chemotherapy   The patient had pegfilgrastim (NEULASTA ONPRO KIT) injection 6 mg, 6 mg, Subcutaneous, Once, 5 of 8 cycles Administration: 6 mg (03/30/2019),  6 mg (04/20/2019), 6 mg (05/11/2019), 6 mg (06/22/2019), 6 mg (06/01/2019) ixabepilone (IXEMPRA) 60 mg in lactated ringers 250 mL chemo infusion, 33.5 mg/m2 = 57 mg (100 % of original dose  32 mg/m2), Intravenous,  Once, 5 of 8 cycles Dose modification: 32 mg/m2 (original dose 32 mg/m2, Cycle 1, Reason: Provider Judgment) Administration: 72 mg (04/20/2019), 72 mg (05/11/2019), 72 mg (06/22/2019), 72 mg (06/01/2019)  for chemotherapy treatment.    08/18/2019 - 09/28/2019 Chemotherapy   The patient had PACLitaxel (TAXOL) 150 mg in sodium chloride 0.9 % 250 mL chemo infusion (</= '80mg'$ /m2), 80 mg/m2 = 150 mg, Intravenous,  Once, 1 of 4 cycles Administration: 150 mg (08/18/2019), 150 mg (09/08/2019), 150 mg (09/15/2019)  for chemotherapy treatment.    10/13/2019 -  Chemotherapy   The patient had DOXOrubicin (ADRIAMYCIN) chemo injection 38 mg, 20 mg/m2 = 38 mg, Intravenous,  Once, 5 of 9 cycles Administration: 38 mg (10/13/2019), 38 mg (10/20/2019), 38 mg (10/27/2019), 38 mg (11/17/2019), 38 mg (11/10/2019) palonosetron (ALOXI) injection 0.25 mg, 0.25 mg, Intravenous,  Once, 5 of 9 cycles Administration: 0.25 mg (10/13/2019), 0.25 mg (10/20/2019), 0.25 mg (10/27/2019), 0.25 mg (11/17/2019), 0.25 mg (11/10/2019)  for chemotherapy treatment.      INTERVAL HISTORY:  Sarah Horne 64 y.o.  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  Patient continues to stay at the rehab.  She is eager to go home.  She has been working with physical therapy-as per patient is in progress.  No nausea no vomiting.  No headaches.  No falls.  No blood in stools or black or stools.  Chronic tingling numbness in the extremities.  Dry skin bilateral palms.   Review of Systems  Constitutional: Positive for malaise/fatigue. Negative for chills, diaphoresis, fever and weight loss.  HENT: Negative for nosebleeds and sore throat.   Eyes: Negative for double vision.  Respiratory: Negative for cough, hemoptysis, sputum production, shortness of breath and wheezing.   Cardiovascular: Negative  for chest pain, palpitations, orthopnea and leg swelling.  Gastrointestinal: Negative for abdominal pain, blood in stool, constipation, heartburn, melena, nausea and vomiting.  Genitourinary: Negative for dysuria, frequency and urgency.  Musculoskeletal: Positive for joint pain. Negative for back pain.  Skin: Negative for itching.  Neurological: Positive for tingling. Negative for dizziness, focal weakness, weakness and headaches.  Endo/Heme/Allergies: Does not bruise/bleed easily.  Psychiatric/Behavioral: Negative for depression. The patient is not nervous/anxious and does not have insomnia.     PAST MEDICAL HISTORY :  Past Medical History:  Diagnosis Date  . Chemotherapy-induced peripheral neuropathy ([place])   . Epistaxis    a. 11/2016 in setting of asa/plavix-->silver nitrate cauterization.  . GI bleed    a. 11/2016 Admission w/ GIB and hypovolemic shock req 3u PRBC's;  b. 11/2016 ECG: gastritis & nonbleeding peptic ulcer; c. 11/2016 Conlonoscopy: rectal and sigmoid colonic ulcers.  . Heart attack ([place])    a. 1998 Cath @ [facility]: reportedly no intervention required.  Marland Kitchen Herceptin-induced cardiomyopathy ([place])    a. In the setting of Herceptin Rx for breast cancer (initiated 12/2014); b. 03/2015 MUGA EF 64%; b. 08/2015 MUGA: EF 51%; c. 10/2015 MUGA: EF 44%; d. 11/2015 Echo: EF 45-50%; e. 01/2016 MUGA: EF 60%; f. 06/2016 MUGA EF 65%; g. 10/2016 MUGA: EF 61%;  h. 12/2016 Echo: EF 55-60%, gr1 DD.  Marland Kitchen Hyperlipidemia   . Hypertension   . Neuropathy   . Possible PAD (  peripheral artery disease) ([place])    a. 11/2016 LE cyanosis and weak pulses-->CTA w/o significant Ao-BiFem dzs. ? distal dzs-->ASA/Plavix initiated by vascular surgery.  Marland Kitchen PSVT (paroxysmal supraventricular tachycardia) ([place])    a. Dx 11/2016.  . Pulmonary embolism ([place])    a. 12/2016 CTA Chest: small nonocclusive PE in inferior segment of the Left lingula, somewhat eccentric filling defect suggesting chronic rather than acute embolic event; b. 10/4399  LE U/S:  No DVT; c. 12/2016 Echo: Nl RV fxn, nl PASP.  Marland Kitchen Recurrent Metastatic breast cancer ([place])    a. Dx 2016: Stage II, ER positive, PR positive, HER-2/neu overexpressing of the left breast-->chemo/radiation; b. 10/2016 CT Abd/pelvis: diffuse liver mets, ill defined sclerotic bone lesions-T12;  c. 10/2016 MRI brain: metastatic lesion along L temporal lobe (19x102m) w/ extensive surrounding edema & 522mmidline shift to right.  . Sepsis ([place]  . Sinus tachycardia     PAST SURGICAL HISTORY :   Past Surgical History:  Procedure Laterality Date  . BREAST BIOPSY Left 2016   Positive  . BREAST LUMPECTOMY WITH SENTINEL LYMPH NODE BIOPSY Left 05/23/2015   Procedure: LEFT BREAST WIDE EXCISION WITH AXILLARY DISSECTION, MASTOPLASTY ;  Surgeon: JeRobert BellowMD;  Location: [facility] ORS;  Service: General;  Laterality: Left;  . BREAST SURGERY Left 12/18/14   breast biopsy/INVASIVE DUCTAL CARCINOMA OF BREAST, NOTTINGHAM GRADE 2.  . Marland KitchenREAST SURGERY  05/23/2015.   Wide excision/mastoplasty, axillary dissection. No residual invasive cancer, positive for residual DCIS. 0/2 nodes identified on axillary dissection. (no SLN by technetium or methylene blue)  . CARDIAC CATHETERIZATION    . COLONOSCOPY WITH PROPOFOL N/A 12/10/2016   Procedure: COLONOSCOPY WITH PROPOFOL;  Surgeon: DaLucilla LameMD;  Location: [facility] ENDOSCOPY;  Service: Endoscopy;  Laterality: N/A;  . COLONOSCOPY WITH PROPOFOL N/A 02/13/2017   Procedure: COLONOSCOPY WITH PROPOFOL;  Surgeon: WoLucilla LameMD;  [facility];  Service: Endoscopy;  Laterality: N/A;  . ESOPHAGOGASTRODUODENOSCOPY N/A 11/03/2019   Procedure: ESOPHAGOGASTRODUODENOSCOPY (EGD);  Surgeon: VaLin LandsmanMD;  [facility];  Service: Gastroenterology;  Laterality: N/A;  . ESOPHAGOGASTRODUODENOSCOPY (EGD) WITH PROPOFOL N/A 12/08/2016   Procedure: ESOPHAGOGASTRODUODENOSCOPY (EGD) WITH PROPOFOL;  Surgeon: DaLucilla LameMD;  Location: [facility];  Service:  Endoscopy;  Laterality: N/A;  . IVC FILTER INSERTION N/A 02/15/2017   Procedure: IVC Filter Insertion;  Surgeon: DeAlgernon HuxleyMD;  [facility];  Service: Cardiovascular;  Laterality: N/A;  . PORTACATH PLACEMENT Right 12-31-14   Dr ByBary Castilla  FAMILY HISTORY :   Family History  Problem Relation Age of Onset  . Breast cancer Maternal Aunt   . Breast cancer Cousin   . Brain cancer Maternal Uncle   . Diabetes Mother   . Hypertension Mother   . Stroke Mother     SOCIAL HISTORY:   Social History   Tobacco Use  . Smoking status: Light Tobacco Smoker    Packs/day: 0.50    Years: 18.00    Pack years: 9.00    Types: Cigarettes  . Smokeless tobacco: Never Used  Substance Use Topics  . Alcohol use: No    Alcohol/week: 0.0 standard drinks  . Drug use: No    ALLERGIES:  is allergic to no known allergies.  MEDICATIONS:  Current Outpatient Medications  Medication Sig Dispense Refill  . acetaminophen (TYLENOL) 650 MG CR tablet Take 650 mg by mouth every 8 (eight) hours as needed for pain.    . Marland Kitchenpixaban (ELIQUIS) 5 MG  TABS tablet Take 1 tablet (5 mg total) by mouth 2 (two) times daily. 60 tablet   . flecainide (TAMBOCOR) 50 MG tablet Take 1 tablet (50 mg total) by mouth 2 (two) times daily. 60 tablet 4  . ipratropium (ATROVENT) 0.03 % nasal spray Place 2 sprays into both nostrils every 12 (twelve) hours. 30 mL 12  . levocetirizine (XYZAL) 5 MG tablet Take 1 tablet (5 mg total) by mouth every evening. 30 tablet 2  . magnesium oxide (MAG-OX) 400 MG tablet Take 1 tablet (400 mg total) by mouth 2 (two) times daily. 60 tablet 5  . metoprolol tartrate (LOPRESSOR) 25 MG tablet Take 1 tablet (25 mg total) by mouth 2 (two) times daily. 60 tablet 1  . montelukast (SINGULAIR) 10 MG tablet Take 1 tablet (10 mg total) by mouth at bedtime. 60 tablet 3  . pregabalin (LYRICA) 100 MG capsule Take 1 capsule (100 mg total) by mouth 2 (two) times daily. 60 capsule 2  . apixaban (ELIQUIS) 5  MG TABS tablet Take 2 tablets (10 mg total) by mouth 2 (two) times daily for 3 days. 60 tablet   . meclizine (ANTIVERT) 25 MG tablet Take 1 tablet (25 mg total) by mouth 3 (three) times daily as needed for dizziness. 30 tablet 0  . Vitamin D, Ergocalciferol, (DRISDOL) 1.25 MG (50000 UNIT) CAPS capsule TAKE 1 CAPSULE (50,000 UNITS TOTAL) ONCE A WEEK BY MOUTH. (Patient not taking: No sig reported) 12 capsule 1   No current facility-administered medications for this visit.   Facility-Administered Medications Ordered in Other Visits  Medication Dose Route Frequency Provider Last Rate Last Admin  . 0.9 %  sodium chloride infusion   Intravenous Continuous [name], MD   New Bag at 12/10/16 1140  . 0.9 %  sodium chloride infusion   Intravenous Continuous Lloyd Huger, MD 999 mL/hr at 04/24/15 1520 New Bag at 05/23/15 0951  . 0.9 %  sodium chloride infusion   Intravenous Continuous Verlon Au, NP 999 mL/hr at 10/31/19 1522 New Bag at 10/31/19 1522  . heparin lock flush 100 unit/mL  500 Units Intravenous Once Berenzon, Dmitriy, MD      . sodium chloride 0.9 % injection 10 mL  10 mL Intravenous PRN [name], MD   10 mL at 04/04/15 1440  . sodium chloride flush (NS) 0.9 % injection 10 mL  10 mL Intravenous PRN Berenzon, Dmitriy, MD        PHYSICAL EXAMINATION: ECOG PERFORMANCE STATUS: 1 - Symptomatic but completely ambulatory  BP 109/70 (BP Location: Right Arm, Patient Position: Sitting, Cuff Size: Normal)   Pulse 81   Temp (!) 97 F (36.1 C) (Tympanic)   Resp 20   Wt 161 lb (73 kg)   BMI 25.99 kg/m   Filed Weights   11/17/19 1018  Weight: 161 lb (73 kg)    Physical Exam  Constitutional: She is oriented to person, place, and time and well-developed, well-nourished, and in no distress.  Patient in wheelchair.    HENT:  Head: Normocephalic and atraumatic.  Mouth/Throat: Oropharynx is clear and moist. No oropharyngeal exudate.  Eyes: Pupils are equal, round, and  reactive to light.  Cardiovascular: Normal rate and regular rhythm.  Pulmonary/Chest: Effort normal and breath sounds normal. No respiratory distress. She has no wheezes.  Abdominal: Soft. Bowel sounds are normal. She exhibits no distension and no mass. There is no abdominal tenderness. There is no rebound and no guarding.  Musculoskeletal:  General: Edema present. No tenderness. Normal range of motion.     Cervical back: Normal range of motion and neck supple.  Neurological: She is alert and oriented to person, place, and time.  Skin: Skin is warm.  Psychiatric: Affect normal.    LABORATORY DATA:  I have reviewed the data as listed    Component Value Date/Time   NA 140 11/17/2019 1002   NA 135 03/04/2017 0918   NA 135 01/08/2015 0850   K 3.9 11/17/2019 1002   K 3.7 01/08/2015 0850   CL 103 11/17/2019 1002   CL 101 01/08/2015 0850   CO2 27 11/17/2019 1002   CO2 26 01/08/2015 0850   GLUCOSE 104 (H) 11/17/2019 1002   GLUCOSE 161 (H) 01/08/2015 0850   BUN 10 11/17/2019 1002   BUN 8 03/04/2017 0918   BUN 17 01/08/2015 0850   CREATININE 0.54 11/17/2019 1002   CREATININE 0.82 01/22/2015 1559   CALCIUM 8.8 (L) 11/17/2019 1002   CALCIUM 9.3 01/08/2015 0850   PROT 6.5 11/17/2019 1002   PROT 6.4 03/04/2017 0918   PROT 6.8 01/22/2015 1559   ALBUMIN 3.4 (L) 11/17/2019 1002   ALBUMIN 2.5 (L) 03/04/2017 0918   ALBUMIN 3.9 01/22/2015 1559   AST 44 (H) 11/17/2019 1002   AST 34 01/22/2015 1559   ALT 30 11/17/2019 1002   ALT 42 01/22/2015 1559   ALKPHOS 211 (H) 11/17/2019 1002   ALKPHOS 157 (H) 01/22/2015 1559   BILITOT 0.8 11/17/2019 1002   BILITOT 6.2 (H) 03/04/2017 0918   BILITOT 0.2 (L) 01/22/2015 1559   GFRNONAA >60 11/17/2019 1002   GFRNONAA >60 01/22/2015 1559   GFRAA >60 11/17/2019 1002   GFRAA >60 01/22/2015 1559    No results found for: SPEP, UPEP  Lab Results  Component Value Date   WBC 5.0 11/17/2019   NEUTROABS 4.1 11/17/2019   HGB 9.9 (L) 11/17/2019    HCT 32.0 (L) 11/17/2019   MCV 101.9 (H) 11/17/2019   PLT 338 11/17/2019      Chemistry      Component Value Date/Time   NA 140 11/17/2019 1002   NA 135 03/04/2017 0918   NA 135 01/08/2015 0850   K 3.9 11/17/2019 1002   K 3.7 01/08/2015 0850   CL 103 11/17/2019 1002   CL 101 01/08/2015 0850   CO2 27 11/17/2019 1002   CO2 26 01/08/2015 0850   BUN 10 11/17/2019 1002   BUN 8 03/04/2017 0918   BUN 17 01/08/2015 0850   CREATININE 0.54 11/17/2019 1002   CREATININE 0.82 01/22/2015 1559      Component Value Date/Time   CALCIUM 8.8 (L) 11/17/2019 1002   CALCIUM 9.3 01/08/2015 0850   ALKPHOS 211 (H) 11/17/2019 1002   ALKPHOS 157 (H) 01/22/2015 1559   AST 44 (H) 11/17/2019 1002   AST 34 01/22/2015 1559   ALT 30 11/17/2019 1002   ALT 42 01/22/2015 1559   BILITOT 0.8 11/17/2019 1002   BILITOT 6.2 (H) 03/04/2017 0918   BILITOT 0.2 (L) 01/22/2015 1559       RADIOGRAPHIC STUDIES: I have personally reviewed the radiological images as listed and agreed with the findings in the report. No results found.   ASSESSMENT & PLAN:  Carcinoma of upper-outer quadrant of left breast in female, estrogen receptor positive ([place]) # Recurrent metastatic breast cancer-ER PR positive HER-2 negative; NOV 2020- PROGRESSION in liver- multiple enhancing lesions noted.  On ADRIAMYCIN weekly.  # Proceed with Adriamycin weekly dose cycle #5  today. Labs today reviewed;  acceptable for treatment today-however LFTs elevated. Will repeat scans in mid-end march 2021.   # Sinus tachycardia- on metoprolol-/Tamabcor- stable.   # Elevated LFTs-likely secondary to progressive malignancy; improving;-monitor closely.  # Brain metastases; Recurrent-s/p RT finshed 11/17.  MRI- improved- monitor closely.   # Peripheral neuropathy grade 1-2-  on Lyrica; stable.   # Right lower extremity DVT-s/p thrombectomy; Eliquis-STABLE.   # DISPOSITION:  # Adriamycin today.  # 1 week- MD; labs- cbc/cmp; ; adriamycin weekly-  Dr.B   No orders of the defined types were placed in this encounter.  All questions were answered. The patient knows to call the clinic with any problems, questions or concerns.     Cammie Sickle, MD 11/17/2019 12:30 PM

## 2019-11-17 NOTE — Progress Notes (Signed)
Virtual Visit via Telephone Note  I connected with Sarah Horne on 11/17/19 at  1:00 PM EST by telephone and verified that I am speaking with the correct person using two identifiers.   I discussed the limitations, risks, security and privacy concerns of performing an evaluation and management service by telephone and the availability of in person appointments. I also discussed with the patient that there may be a patient responsible charge related to this service. The patient expressed understanding and agreed to proceed.   History of Present Illness: Sarah Horne is a 64 y.o. female with multiple medical problems including stage IV breast cancer with leptomeningeal and liver metastases who has had disease progression on multiple previous lines of treatment. MRI of the brain on 05/06/2019 showed a several new metastatic lesions. MRI of the brain on 07/06/2019 also showed evidence of disease progression with enlarging existing lesions and note of interval new metastatic lesions. Patient was most recently rotated Adriamycin with some evidence of disease improvement.  Patient was referred to palliative care to help address goals and manage ongoing symptoms..    Observations/Objective: Patient was hospitalized 11/01/2019-11/07/2019 with weakness and right lower extremity edema.  She is found to have extensive right lower extremity DVT.  Patient was anticoagulated.  MRI of the brain revealed brain mets.  Patient's hospitalization was complicated by dysphagia.  She underwent EGD with esophageal dilation.  [facility]  I called and spoke with her today by phone.  She reports doing reasonably well since discharging from the hospital.  She remains weak.  She is currently working with PT.  No significant changes or concerns were reported.  Patient did ask about setting up med alert when she returns home.  I did call and leave a voicemail for the med alert coordinator at [facility]  at [phone].  Assessment and Plan: Stage IV breast cancer -on weekly Adriamycin.  Plan is to repeat imaging next month.  Weakness -currently at rehab  Social -set up med alert when patient returns home  ACP -MOST form previously completed.  Patient is a DNR  Follow Up Instructions: Follow-up telephone visit about a month   I discussed the assessment and treatment plan with the patient. The patient was provided an opportunity to ask questions and all were answered. The patient agreed with the plan and demonstrated an understanding of the instructions.   The patient was advised to call back or seek an in-person evaluation if the symptoms worsen or if the condition fails to improve as anticipated.  I provided 10 minutes of non-face-to-face time during this encounter.   Irean Hong, NP

## 2019-11-17 NOTE — Assessment & Plan Note (Signed)
#  Recurrent metastatic breast cancer-ER PR positive HER-2 negative; NOV 2020- PROGRESSION in liver- multiple enhancing lesions noted.  On ADRIAMYCIN weekly.  # Proceed with Adriamycin weekly dose cycle #5 today. Labs today reviewed;  acceptable for treatment today-however LFTs elevated. Will repeat scans in mid-end march 2021.   # Sinus tachycardia- on metoprolol-/Tamabcor- stable.   # Elevated LFTs-likely secondary to progressive malignancy; improving;-monitor closely.  # Brain metastases; Recurrent-s/p RT finshed 11/17.  MRI- improved- monitor closely.   # Peripheral neuropathy grade 1-2-  on Lyrica; stable.   # Right lower extremity DVT-s/p thrombectomy; Eliquis-STABLE.   # DISPOSITION:  # Adriamycin today.  # 1 week- MD; labs- cbc/cmp; ; adriamycin weekly- Dr.B

## 2019-11-17 NOTE — Progress Notes (Signed)
Ok to proceed with [place] from 2018, will get new one next month per md

## 2019-11-24 ENCOUNTER — Inpatient Hospital Stay: Payer: Medicare HMO

## 2019-11-24 ENCOUNTER — Other Ambulatory Visit: Payer: Self-pay

## 2019-11-24 ENCOUNTER — Inpatient Hospital Stay (HOSPITAL_BASED_OUTPATIENT_CLINIC_OR_DEPARTMENT_OTHER): Payer: Medicare HMO | Admitting: Internal Medicine

## 2019-11-24 ENCOUNTER — Encounter: Payer: Self-pay | Admitting: Internal Medicine

## 2019-11-24 VITALS — BP 129/88 | HR 105 | Temp 95.5°F | Wt 158.0 lb

## 2019-11-24 VITALS — BP 129/84 | HR 90

## 2019-11-24 DIAGNOSIS — C50412 Malignant neoplasm of upper-outer quadrant of left female breast: Secondary | ICD-10-CM

## 2019-11-24 DIAGNOSIS — Z5111 Encounter for antineoplastic chemotherapy: Secondary | ICD-10-CM | POA: Diagnosis not present

## 2019-11-24 DIAGNOSIS — Z9221 Personal history of antineoplastic chemotherapy: Secondary | ICD-10-CM | POA: Diagnosis not present

## 2019-11-24 DIAGNOSIS — R7989 Other specified abnormal findings of blood chemistry: Secondary | ICD-10-CM | POA: Diagnosis not present

## 2019-11-24 DIAGNOSIS — Z79818 Long term (current) use of other agents affecting estrogen receptors and estrogen levels: Secondary | ICD-10-CM | POA: Diagnosis not present

## 2019-11-24 DIAGNOSIS — Z170 Estrogen receptor positive status [ER+]: Secondary | ICD-10-CM | POA: Diagnosis not present

## 2019-11-24 DIAGNOSIS — D72819 Decreased white blood cell count, unspecified: Secondary | ICD-10-CM | POA: Diagnosis not present

## 2019-11-24 DIAGNOSIS — Z7189 Other specified counseling: Secondary | ICD-10-CM

## 2019-11-24 DIAGNOSIS — R945 Abnormal results of liver function studies: Secondary | ICD-10-CM | POA: Diagnosis not present

## 2019-11-24 DIAGNOSIS — C7931 Secondary malignant neoplasm of brain: Secondary | ICD-10-CM | POA: Diagnosis not present

## 2019-11-24 LAB — COMPREHENSIVE METABOLIC PANEL
ALT: 30 U/L (ref 0–44)
AST: 41 U/L (ref 15–41)
Albumin: 3.7 g/dL (ref 3.5–5.0)
Alkaline Phosphatase: 229 U/L — ABNORMAL HIGH (ref 38–126)
Anion gap: 10 (ref 5–15)
BUN: 13 mg/dL (ref 8–23)
CO2: 24 mmol/L (ref 22–32)
Calcium: 9 mg/dL (ref 8.9–10.3)
Chloride: 102 mmol/L (ref 98–111)
Creatinine, Ser: 0.68 mg/dL (ref 0.44–1.00)
GFR calc Af Amer: >60 mL/min (ref >60)
GFR calc non Af Amer: >60 mL/min (ref >60)
Glucose, Bld: 96 mg/dL (ref 70–99)
Potassium: 4 mmol/L (ref 3.5–5.1)
Sodium: 136 mmol/L (ref 135–145)
Total Bilirubin: 1 mg/dL (ref 0.3–1.2)
Total Protein: 7 g/dL (ref 6.5–8.1)

## 2019-11-24 LAB — CBC WITH DIFFERENTIAL/PLATELET
Abs Immature Granulocytes: 0.04 10*3/uL (ref 0.00–0.07)
Basophils Absolute: 0 10*3/uL (ref 0.0–0.1)
Basophils Relative: 0 %
Eosinophils Absolute: 0 10*3/uL (ref 0.0–0.5)
Eosinophils Relative: 0 %
HCT: 34.6 % — ABNORMAL LOW (ref 36.0–46.0)
Hemoglobin: 10.9 g/dL — ABNORMAL LOW (ref 12.0–15.0)
Immature Granulocytes: 1 %
Lymphocytes Relative: 10 %
Lymphs Abs: 0.4 10*3/uL — ABNORMAL LOW (ref 0.7–4.0)
MCH: 31.6 pg (ref 26.0–34.0)
MCHC: 31.5 g/dL (ref 30.0–36.0)
MCV: 100.3 fL — ABNORMAL HIGH (ref 80.0–100.0)
Monocytes Absolute: 0.1 10*3/uL (ref 0.1–1.0)
Monocytes Relative: 3 %
Neutro Abs: 3.2 10*3/uL (ref 1.7–7.7)
Neutrophils Relative %: 86 %
Platelets: 230 10*3/uL (ref 150–400)
RBC: 3.45 MIL/uL — ABNORMAL LOW (ref 3.87–5.11)
RDW: 18.7 % — ABNORMAL HIGH (ref 11.5–15.5)
WBC: 3.7 10*3/uL — ABNORMAL LOW (ref 4.0–10.5)
nRBC: 0 % (ref 0.0–0.2)

## 2019-11-24 MED ORDER — DEXAMETHASONE SODIUM PHOSPHATE 10 MG/ML IJ SOLN
4.0000 mg | Freq: Once | INTRAMUSCULAR | Status: AC
  Administered 2019-11-24: 4 mg via INTRAVENOUS
  Filled 2019-11-24: qty 1

## 2019-11-24 MED ORDER — SODIUM CHLORIDE 0.9% FLUSH
10.0000 mL | INTRAVENOUS | Status: DC | PRN
  Administered 2019-11-24: 10 mL via INTRAVENOUS
  Filled 2019-11-24: qty 10

## 2019-11-24 MED ORDER — HEPARIN SOD (PORK) LOCK FLUSH 100 UNIT/ML IV SOLN
500.0000 [IU] | Freq: Once | INTRAVENOUS | Status: AC
  Administered 2019-11-24: 500 [IU] via INTRAVENOUS
  Filled 2019-11-24: qty 5

## 2019-11-24 MED ORDER — SODIUM CHLORIDE 0.9 % IV SOLN: 4.0000 mg | Freq: Once | INTRAVENOUS | Status: DC

## 2019-11-24 MED ORDER — DOXORUBICIN HCL CHEMO IV INJECTION 2 MG/ML
20.0000 mg/m2 | Freq: Once | INTRAVENOUS | Status: AC
  Administered 2019-11-24: 38 mg via INTRAVENOUS
  Filled 2019-11-24: qty 19

## 2019-11-24 MED ORDER — SODIUM CHLORIDE 0.9 % IV SOLN
Freq: Once | INTRAVENOUS | Status: AC
  Filled 2019-11-24: qty 250

## 2019-11-24 MED ORDER — PALONOSETRON HCL INJECTION 0.25 MG/5ML
0.2500 mg | Freq: Once | INTRAVENOUS | Status: AC
  Administered 2019-11-24: 0.25 mg via INTRAVENOUS
  Filled 2019-11-24: qty 5

## 2019-11-24 MED ORDER — HEPARIN SOD (PORK) LOCK FLUSH 100 UNIT/ML IV SOLN
INTRAVENOUS | Status: AC
  Filled 2019-11-24: qty 5

## 2019-11-24 NOTE — Progress Notes (Signed)
HR 105 ok to treat per md

## 2019-11-24 NOTE — Progress Notes (Signed)
[facility] OFFICE PROGRESS NOTE  Patient Care Team: Delsa Grana, PA-C as PCP - General (Family Medicine) Wellington Hampshire, MD as PCP - Cardiology (Cardiology) Byrnett, Forest Gleason, MD (General Surgery) [name], MD (Inactive) as Attending Physician (Internal Medicine)  Cancer Staging No matching staging information was found for the patient.   Oncology History Overview Note  # LEFT BREAST IDC; STAGE II [cT2N1] ER >90%; PR- 50-90%; her 2 NEU- POS; s/p Neoadj chemo; AUG 2016- TCH+P s/p Lumpec & partial ALND- path CR; s/p RT [finished Nov 2016];  adj Herceptin; HELD for Jan 19th 2017 [in DEC 2016-EF dropped from 63 to 51%; FEB 27th EF-42.9%]; JAN 2016 START Arimidex; May 2017- EF- 60%; May 24th 2017-Re-start Herceptin q 3W; July 28th STOP herceptin [finished 48m/m2; sec to Low EF; however 2017 Oct EF= 67%; improved]  # DEC 4th 2017- Start Neratinib 4 pills; DEC 11th 3 pills; STOPPED.  # FEB 2018- RECURRENCE ER/PR positive; Her 2 NEGATIVE [liver Bx]  # MARCH 1st 2018- Tax-Cytoxan [poor tol]  # FEB 2018- Brain mets [SBRT; Dr.Crystal; finished Feb 28th 2018]  # March 2018- Eribulin- poor tolerance  # June 8th 2018- Started Faslodex + Abema [abema-multiple interruptions sec to neutropenia]  # MRI Brain- March 2019- Leptomeningeal mets [WBRT; No LP s/p WBRT- 01/09/2018]; STOP Abema [566mday-sec to severe cytopenia]+faslodex  # MAY 20tth 019- Start Xeloda; STOPPED in Nov 22nd 2019- sec to Progressive liver lesions on CT scan  # NOV 26th 2019- Faslodex + Piqaray 25049m; Jan 2020- Piqray 300 mg+ faslodex; FEB 17th CT scan- mixed response/ Overall progression; STOP Piqray + faslodex.   # FEB 25th 2020- ERIBULIN; Poor tolerance sec to severe neutropenia.  Stopped May 2020.  CT scan May 2020-new/progressive 2 cm liver lesion;   #July nd 2020-Ixempra x5 cycles-; NOV 2020-MRI liver- worsening liver lesions [rising AST/ALT].   # NOV 20tC092413axol weekly [consent-];  December 31-2020-progression/rising LFTs.  Stop Taxol  #January 15th, 2021-Adriamycin weekly [C]  #May 06, 2019-MRI brain 5 new lesions [3 mm to 10 mm]-asymptomatic; [previous whole brain radiation]- OCT 2020- Re-Irradiation.   # PN- G-2; may 2017- Cymbalta 5m71mMARCH 2018-  acute vascular insuff of BIL LE [? Taxotere vasoconstriction on asprin]; # hemorragic shock LGIB [s/p colo; Dr.Wohl]  #  SVT- on flecainide.   # April 2016- Liver Bx- NEG;   # Drop in EF from Herceptin [recovered OCt 27th- EF 65%]  # BMD- jan 2017- osteopenia  # 2020- SEP-s/p Pallaitive care evaluation  -----------------------------------------------------------     [place]- Sep 4th 2018- PDL-1 0%/NEG for BRCA; MSI-Stable; Positive for PI3K; ccnd-1; FGF** -----------------------------------------------------------  Dx: Metastatic Breast cancer [ER/PR-Pos; her -2 NEG] Stage IV; goals: palliative Current treatment-Adriamycin [C]   Carcinoma of upper-outer quadrant of left breast in female, estrogen receptor positive (HCC)[place]/25/2020 - 01/17/2019 Chemotherapy   The patient had eriBULin mesylate (HALAVEN) 2.1 mg in sodium chloride 0.9 % 100 mL chemo infusion, 1.2 mg/m2 = 2.1 mg (100 % of original dose 1.2 mg/m2), Intravenous,  Once, 3 of 4 cycles Dose modification: 1.2 mg/m2 (original dose 1.2 mg/m2, Cycle 1, Reason: Provider Judgment), 0.8 mg/m2 (original dose 1.2 mg/m2, Cycle 3, Reason: Provider Judgment) Administration: 2.1 mg (11/22/2018), 2.1 mg (11/29/2018), 2.1 mg (12/20/2018), 1.4 mg (01/17/2019)  for chemotherapy treatment.    03/30/2019 - 07/12/2019 Chemotherapy   The patient had pegfilgrastim (NEULASTA ONPRO KIT) injection 6 mg, 6 mg, Subcutaneous, Once, 5 of 8 cycles Administration: 6 mg (03/30/2019),  6 mg (04/20/2019), 6 mg (05/11/2019), 6 mg (06/22/2019), 6 mg (06/01/2019) ixabepilone (IXEMPRA) 60 mg in lactated ringers 250 mL chemo infusion, 33.5 mg/m2 = 57 mg (100 % of original dose  32 mg/m2), Intravenous,  Once, 5 of 8 cycles Dose modification: 32 mg/m2 (original dose 32 mg/m2, Cycle 1, Reason: Provider Judgment) Administration: 72 mg (04/20/2019), 72 mg (05/11/2019), 72 mg (06/22/2019), 72 mg (06/01/2019)  for chemotherapy treatment.    08/18/2019 - 09/28/2019 Chemotherapy   The patient had PACLitaxel (TAXOL) 150 mg in sodium chloride 0.9 % 250 mL chemo infusion (</= '80mg'$ /m2), 80 mg/m2 = 150 mg, Intravenous,  Once, 1 of 4 cycles Administration: 150 mg (08/18/2019), 150 mg (09/08/2019), 150 mg (09/15/2019)  for chemotherapy treatment.    10/13/2019 -  Chemotherapy   The patient had DOXOrubicin (ADRIAMYCIN) chemo injection 38 mg, 20 mg/m2 = 38 mg, Intravenous,  Once, 6 of 9 cycles Administration: 38 mg (10/13/2019), 38 mg (10/20/2019), 38 mg (10/27/2019), 38 mg (11/17/2019), 38 mg (11/10/2019) palonosetron (ALOXI) injection 0.25 mg, 0.25 mg, Intravenous,  Once, 6 of 9 cycles Administration: 0.25 mg (10/13/2019), 0.25 mg (10/20/2019), 0.25 mg (10/27/2019), 0.25 mg (11/17/2019), 0.25 mg (11/10/2019)  for chemotherapy treatment.      INTERVAL HISTORY:  Sarah Horne 64 y.o.  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  Patient complains of mild soreness in the mouth.  Denies any difficulty swallowing or pain with swallowing.  She is eager to get out of the rehab.  She is awaiting to go home today from the rehab.  Chronic mild tingling and numbness next images.  Dry bilateral palms.  Appetite is good.  No weight loss.   Review of Systems  Constitutional: Positive for malaise/fatigue. Negative for chills, diaphoresis, fever and weight loss.  HENT: Negative for nosebleeds and sore throat.   Eyes: Negative for double vision.  Respiratory: Negative for cough, hemoptysis, sputum production, shortness of breath and wheezing.   Cardiovascular: Negative for  chest pain, palpitations, orthopnea and leg swelling.  Gastrointestinal: Negative for abdominal pain, blood in stool, constipation, heartburn, melena, nausea and vomiting.  Genitourinary: Negative for dysuria, frequency and urgency.  Musculoskeletal: Positive for joint pain. Negative for back pain.  Skin: Negative for itching.  Neurological: Positive for tingling. Negative for dizziness, focal weakness, weakness and headaches.  Endo/Heme/Allergies: Does not bruise/bleed easily.  Psychiatric/Behavioral: Negative for depression. The patient is not nervous/anxious and does not have insomnia.     PAST MEDICAL HISTORY :  Past Medical History:  Diagnosis Date  . Chemotherapy-induced peripheral neuropathy ([place])   . Epistaxis    a. 11/2016 in setting of asa/plavix-->silver nitrate cauterization.  . GI bleed    a. 11/2016 Admission w/ GIB and hypovolemic shock req 3u PRBC's;  b. 11/2016 ECG: gastritis & nonbleeding peptic ulcer; c. 11/2016 Conlonoscopy: rectal and sigmoid colonic ulcers.  . Heart attack ([place])    a. 1998 Cath @ [facility]: reportedly no intervention required.  Marland Kitchen Herceptin-induced cardiomyopathy ([place])    a. In the setting of Herceptin Rx for breast cancer (initiated 12/2014); b. 03/2015 MUGA EF 64%; b. 08/2015 MUGA: EF 51%; c. 10/2015 MUGA: EF 44%; d. 11/2015 Echo: EF 45-50%; e. 01/2016 MUGA: EF 60%; f. 06/2016 MUGA EF 65%; g. 10/2016 MUGA: EF 61%;  h. 12/2016 Echo: EF 55-60%, gr1 DD.  Marland Kitchen Hyperlipidemia   . Hypertension   . Neuropathy   . Possible PAD (peripheral  artery disease) ([place])    a. 11/2016 LE cyanosis and weak pulses-->CTA w/o significant Ao-BiFem dzs. ? distal dzs-->ASA/Plavix initiated by vascular surgery.  Marland Kitchen PSVT (paroxysmal supraventricular tachycardia) ([place])    a. Dx 11/2016.  . Pulmonary embolism ([place])    a. 12/2016 CTA Chest: small nonocclusive PE in inferior segment of the Left lingula, somewhat eccentric filling defect suggesting chronic rather than acute embolic event; b. 11/5327 LE  U/S:  No DVT; c. 12/2016 Echo: Nl RV fxn, nl PASP.  Marland Kitchen Recurrent Metastatic breast cancer ([place])    a. Dx 2016: Stage II, ER positive, PR positive, HER-2/neu overexpressing of the left breast-->chemo/radiation; b. 10/2016 CT Abd/pelvis: diffuse liver mets, ill defined sclerotic bone lesions-T12;  c. 10/2016 MRI brain: metastatic lesion along L temporal lobe (19x18m) w/ extensive surrounding edema & 563mmidline shift to right.  . Sepsis ([place]  . Sinus tachycardia     PAST SURGICAL HISTORY :   Past Surgical History:  Procedure Laterality Date  . BREAST BIOPSY Left 2016   Positive  . BREAST LUMPECTOMY WITH SENTINEL LYMPH NODE BIOPSY Left 05/23/2015   Procedure: LEFT BREAST WIDE EXCISION WITH AXILLARY DISSECTION, MASTOPLASTY ;  Surgeon: JeRobert BellowMD;  Location: [facility] ORS;  Service: General;  Laterality: Left;  . BREAST SURGERY Left 12/18/14   breast biopsy/INVASIVE DUCTAL CARCINOMA OF BREAST, NOTTINGHAM GRADE 2.  . Marland KitchenREAST SURGERY  05/23/2015.   Wide excision/mastoplasty, axillary dissection. No residual invasive cancer, positive for residual DCIS. 0/2 nodes identified on axillary dissection. (no SLN by technetium or methylene blue)  . CARDIAC CATHETERIZATION    . COLONOSCOPY WITH PROPOFOL N/A 12/10/2016   Procedure: COLONOSCOPY WITH PROPOFOL;  Surgeon: DaLucilla LameMD;  Location: [facility] ENDOSCOPY;  Service: Endoscopy;  Laterality: N/A;  . COLONOSCOPY WITH PROPOFOL N/A 02/13/2017   Procedure: COLONOSCOPY WITH PROPOFOL;  Surgeon: WoLucilla LameMD;  [facility];  Service: Endoscopy;  Laterality: N/A;  . ESOPHAGOGASTRODUODENOSCOPY N/A 11/03/2019   Procedure: ESOPHAGOGASTRODUODENOSCOPY (EGD);  Surgeon: VaLin LandsmanMD;  [facility];  Service: Gastroenterology;  Laterality: N/A;  . ESOPHAGOGASTRODUODENOSCOPY (EGD) WITH PROPOFOL N/A 12/08/2016   Procedure: ESOPHAGOGASTRODUODENOSCOPY (EGD) WITH PROPOFOL;  Surgeon: DaLucilla LameMD;  Location: [facility];  Service:  Endoscopy;  Laterality: N/A;  . IVC FILTER INSERTION N/A 02/15/2017   Procedure: IVC Filter Insertion;  Surgeon: DeAlgernon HuxleyMD;  [facility];  Service: Cardiovascular;  Laterality: N/A;  . PORTACATH PLACEMENT Right 12-31-14   Dr ByBary Castilla  FAMILY HISTORY :   Family History  Problem Relation Age of Onset  . Breast cancer Maternal Aunt   . Breast cancer Cousin   . Brain cancer Maternal Uncle   . Diabetes Mother   . Hypertension Mother   . Stroke Mother     SOCIAL HISTORY:   Social History   Tobacco Use  . Smoking status: Light Tobacco Smoker    Packs/day: 0.50    Years: 18.00    Pack years: 9.00    Types: Cigarettes  . Smokeless tobacco: Never Used  Substance Use Topics  . Alcohol use: No    Alcohol/week: 0.0 standard drinks  . Drug use: No    ALLERGIES:  is allergic to no known allergies.  MEDICATIONS:  Current Outpatient Medications  Medication Sig Dispense Refill  . apixaban (ELIQUIS) 5 MG TABS tablet Take 1 tablet (5 mg total) by mouth 2 (two) times daily. 60 tablet   . flecainide (TAMBOCOR) 50 MG tablet  Take 1 tablet (50 mg total) by mouth 2 (two) times daily. 60 tablet 4  . ipratropium (ATROVENT) 0.03 % nasal spray Place 2 sprays into both nostrils every 12 (twelve) hours. 30 mL 12  . levocetirizine (XYZAL) 5 MG tablet Take 1 tablet (5 mg total) by mouth every evening. 30 tablet 2  . magnesium oxide (MAG-OX) 400 MG tablet Take 1 tablet (400 mg total) by mouth 2 (two) times daily. 60 tablet 5  . meclizine (ANTIVERT) 25 MG tablet Take 1 tablet (25 mg total) by mouth 3 (three) times daily as needed for dizziness. 30 tablet 0  . metoprolol tartrate (LOPRESSOR) 25 MG tablet Take 1 tablet (25 mg total) by mouth 2 (two) times daily. 60 tablet 1  . montelukast (SINGULAIR) 10 MG tablet Take 1 tablet (10 mg total) by mouth at bedtime. 60 tablet 3  . pregabalin (LYRICA) 100 MG capsule Take 1 capsule (100 mg total) by mouth 2 (two) times daily. 60 capsule 2   . Vitamin D, Ergocalciferol, (DRISDOL) 1.25 MG (50000 UNIT) CAPS capsule TAKE 1 CAPSULE (50,000 UNITS TOTAL) ONCE A WEEK BY MOUTH. 12 capsule 1  . acetaminophen (TYLENOL) 650 MG CR tablet Take 650 mg by mouth every 8 (eight) hours as needed for pain.    Marland Kitchen apixaban (ELIQUIS) 5 MG TABS tablet Take 2 tablets (10 mg total) by mouth 2 (two) times daily for 3 days. 60 tablet    No current facility-administered medications for this visit.   Facility-Administered Medications Ordered in Other Visits  Medication Dose Route Frequency Provider Last Rate Last Admin  . 0.9 %  sodium chloride infusion   Intravenous Continuous [name], MD   New Bag at 12/10/16 1140  . 0.9 %  sodium chloride infusion   Intravenous Continuous Lloyd Huger, MD 999 mL/hr at 04/24/15 1520 New Bag at 05/23/15 0951  . 0.9 %  sodium chloride infusion   Intravenous Continuous Verlon Au, NP 999 mL/hr at 10/31/19 1522 New Bag at 10/31/19 1522  . heparin lock flush 100 unit/mL  500 Units Intravenous Once Berenzon, Dmitriy, MD      . sodium chloride 0.9 % injection 10 mL  10 mL Intravenous PRN [name], MD   10 mL at 04/04/15 1440  . sodium chloride flush (NS) 0.9 % injection 10 mL  10 mL Intravenous PRN Berenzon, Dmitriy, MD      . sodium chloride flush (NS) 0.9 % injection 10 mL  10 mL Intravenous PRN Cammie Sickle, MD   10 mL at 11/24/19 0109    PHYSICAL EXAMINATION: ECOG PERFORMANCE STATUS: 1 - Symptomatic but completely ambulatory  BP 129/88 (BP Location: Right Wrist, Patient Position: Sitting, Cuff Size: Normal)   Pulse (!) 105   Temp (!) 95.5 F (35.3 C) (Tympanic)   Wt 158 lb (71.7 kg)   SpO2 100% Comment: on room air  BMI 25.50 kg/m   Filed Weights   11/24/19 0838  Weight: 158 lb (71.7 kg)    Physical Exam  Constitutional: She is oriented to person, place, and time and well-developed, well-nourished, and in no distress.  Patient in wheelchair.    HENT:  Head: Normocephalic and  atraumatic.  Mouth/Throat: Oropharynx is clear and moist. No oropharyngeal exudate.  Eyes: Pupils are equal, round, and reactive to light.  Cardiovascular: Normal rate and regular rhythm.  Pulmonary/Chest: Effort normal and breath sounds normal. No respiratory distress. She has no wheezes.  Abdominal: Soft. Bowel sounds are normal. She  exhibits no distension and no mass. There is no abdominal tenderness. There is no rebound and no guarding.  Musculoskeletal:        General: Edema present. No tenderness. Normal range of motion.     Cervical back: Normal range of motion and neck supple.  Neurological: She is alert and oriented to person, place, and time.  Skin: Skin is warm.  Psychiatric: Affect normal.    LABORATORY DATA:  I have reviewed the data as listed    Component Value Date/Time   NA 136 11/24/2019 0822   NA 135 03/04/2017 0918   NA 135 01/08/2015 0850   K 4.0 11/24/2019 0822   K 3.7 01/08/2015 0850   CL 102 11/24/2019 0822   CL 101 01/08/2015 0850   CO2 24 11/24/2019 0822   CO2 26 01/08/2015 0850   GLUCOSE 96 11/24/2019 0822   GLUCOSE 161 (H) 01/08/2015 0850   BUN 13 11/24/2019 0822   BUN 8 03/04/2017 0918   BUN 17 01/08/2015 0850   CREATININE 0.68 11/24/2019 0822   CREATININE 0.82 01/22/2015 1559   CALCIUM 9.0 11/24/2019 0822   CALCIUM 9.3 01/08/2015 0850   PROT 7.0 11/24/2019 0822   PROT 6.4 03/04/2017 0918   PROT 6.8 01/22/2015 1559   ALBUMIN 3.7 11/24/2019 0822   ALBUMIN 2.5 (L) 03/04/2017 0918   ALBUMIN 3.9 01/22/2015 1559   AST 41 11/24/2019 0822   AST 34 01/22/2015 1559   ALT 30 11/24/2019 0822   ALT 42 01/22/2015 1559   ALKPHOS 229 (H) 11/24/2019 0822   ALKPHOS 157 (H) 01/22/2015 1559   BILITOT 1.0 11/24/2019 0822   BILITOT 6.2 (H) 03/04/2017 0918   BILITOT 0.2 (L) 01/22/2015 1559   GFRNONAA >60 11/24/2019 0822   GFRNONAA >60 01/22/2015 1559   GFRAA >60 11/24/2019 0822   GFRAA >60 01/22/2015 1559    No results found for: SPEP, UPEP  Lab  Results  Component Value Date   WBC 3.7 (L) 11/24/2019   NEUTROABS 3.2 11/24/2019   HGB 10.9 (L) 11/24/2019   HCT 34.6 (L) 11/24/2019   MCV 100.3 (H) 11/24/2019   PLT 230 11/24/2019      Chemistry      Component Value Date/Time   NA 136 11/24/2019 0822   NA 135 03/04/2017 0918   NA 135 01/08/2015 0850   K 4.0 11/24/2019 0822   K 3.7 01/08/2015 0850   CL 102 11/24/2019 0822   CL 101 01/08/2015 0850   CO2 24 11/24/2019 0822   CO2 26 01/08/2015 0850   BUN 13 11/24/2019 0822   BUN 8 03/04/2017 0918   BUN 17 01/08/2015 0850   CREATININE 0.68 11/24/2019 0822   CREATININE 0.82 01/22/2015 1559      Component Value Date/Time   CALCIUM 9.0 11/24/2019 0822   CALCIUM 9.3 01/08/2015 0850   ALKPHOS 229 (H) 11/24/2019 0822   ALKPHOS 157 (H) 01/22/2015 1559   AST 41 11/24/2019 0822   AST 34 01/22/2015 1559   ALT 30 11/24/2019 0822   ALT 42 01/22/2015 1559   BILITOT 1.0 11/24/2019 0822   BILITOT 6.2 (H) 03/04/2017 0918   BILITOT 0.2 (L) 01/22/2015 1559       RADIOGRAPHIC STUDIES: I have personally reviewed the radiological images as listed and agreed with the findings in the report. No results found.   ASSESSMENT & PLAN:  Carcinoma of upper-outer quadrant of left breast in female, estrogen receptor positive ([place]) # Recurrent metastatic breast cancer-ER PR positive HER-2 negative;  NOV 2020- PROGRESSION in liver- multiple enhancing lesions noted.  On ADRIAMYCIN weekly.  Clinically stable.  # Proceed with Adriamycin weekly dose cycle #6  today. Labs today reviewed;  acceptable for treatment today-however LFTs elevated see below.  We will plan to get CT scans prior to next visit.  We will also get a 2D echo.  # Sinus tachycardia- on metoprolol-/Tamabcor- stable.   # Elevated LFTs-likely secondary to progressive malignancy; stable. monitor closely.  # Brain metastases; Recurrent-s/p RT finshed 11/17.  MRI- improved; clinically Stable.   # Peripheral neuropathy grade 1-2-  on  Lyrica;STABLE.   # Right lower extremity DVT-s/p thrombectomy; Eliquis- STABLE.   # DISPOSITION:  # Adriamycin today.  # 1 week- MD; labs- cbc/cmp; ; adriamycin weekly; priorCT C/A/P; 2 d echo asap- Dr.B   Orders Placed This Encounter  Procedures  . CT ABDOMEN PELVIS W CONTRAST    Standing Status:   Future    Standing Expiration Date:   11/23/2020    Order Specific Question:   If indicated for the ordered procedure, I authorize the administration of contrast media per Radiology protocol    Answer:   Yes    Order Specific Question:   Preferred imaging location?    Answer:   [facility]    Order Specific Question:   Radiology Contrast Protocol - do NOT remove file path    Answer:   [email or web address]    Order Specific Question:   ** REASON FOR EXAM (FREE TEXT)    Answer:   metastatic breast cancer  . CT Chest W Contrast    Standing Status:   Future    Standing Expiration Date:   11/23/2020    Order Specific Question:   ** REASON FOR EXAM (FREE TEXT)    Answer:   metastatic breats cancer    Order Specific Question:   If indicated for the ordered procedure, I authorize the administration of contrast media per Radiology protocol    Answer:   Yes    Order Specific Question:   Preferred imaging location?    Answer:   [facility]    Order Specific Question:   Radiology Contrast Protocol - do NOT remove file path    Answer:   [email or web address]  . ECHOCARDIOGRAM COMPLETE    Standing Status:   Future    Standing Expiration Date:   02/20/2021    Order Specific Question:   Where should this test be performed    Answer:   [facility]    Order Specific Question:   Please indicate who you request to read the echo results.    Answer:   [facility]    Order Specific Question:   Perflutren DEFINITY (image enhancing agent) should be administered unless hypersensitivity or allergy exist    Answer:   Administer Perflutren     Order Specific Question:   Reason for exam-Echo    Answer:   Chemotherapy evaluation  v87.41 / v58.11   All questions were answered. The patient knows to call the clinic with any problems, questions or concerns.     Cammie Sickle, MD 11/24/2019 11:08 AM

## 2019-11-24 NOTE — Assessment & Plan Note (Addendum)
#  Recurrent metastatic breast cancer-ER PR positive HER-2 negative; NOV 2020- PROGRESSION in liver- multiple enhancing lesions noted.  On ADRIAMYCIN weekly.  Clinically stable.  # Proceed with Adriamycin weekly dose cycle #6  today. Labs today reviewed;  acceptable for treatment today-however LFTs elevated see below.  We will plan to get CT scans prior to next visit.  We will also get a 2D echo.  # Sinus tachycardia- on metoprolol-/Tamabcor- stable.   # Elevated LFTs-likely secondary to progressive malignancy; stable. monitor closely.  # Brain metastases; Recurrent-s/p RT finshed 11/17.  MRI- improved; clinically Stable.   # Peripheral neuropathy grade 1-2-  on Lyrica;STABLE.   # Right lower extremity DVT-s/p thrombectomy; Eliquis- STABLE.   # DISPOSITION:  # Adriamycin today.  # 1 week- MD; labs- cbc/cmp; ; adriamycin weekly; priorCT C/A/P; 2 d echo asap- Dr.B

## 2019-11-24 NOTE — Progress Notes (Signed)
HR 104. Per Dr. Rogue Bussing okay to proceed with treatment.   1012:Pt requested to speak with another patient that she knew. Pt standing at another patient's chairside, wheelchair within reach and locked.  1013:Per both patients, pt fell into the other patient's lap and was assisted by other patient who slid her onto the floor. Pt denies any pain and reports that she slid out of the chair and did not hit anything. 1017:VSS and MD aware. Per MD okay to discharge home with no new orders.  1021: Pt educated on when to call clinic or seek medical attention if needed. Pt verbalizes understanding. Pt and VS stable at time of discharge.

## 2019-11-25 DIAGNOSIS — C7931 Secondary malignant neoplasm of brain: Secondary | ICD-10-CM | POA: Diagnosis not present

## 2019-11-25 DIAGNOSIS — G620 Drug-induced polyneuropathy: Secondary | ICD-10-CM | POA: Diagnosis not present

## 2019-11-25 DIAGNOSIS — D702 Other drug-induced agranulocytosis: Secondary | ICD-10-CM | POA: Diagnosis not present

## 2019-11-25 DIAGNOSIS — C184 Malignant neoplasm of transverse colon: Secondary | ICD-10-CM | POA: Diagnosis not present

## 2019-11-25 DIAGNOSIS — C787 Secondary malignant neoplasm of liver and intrahepatic bile duct: Secondary | ICD-10-CM | POA: Diagnosis not present

## 2019-11-25 DIAGNOSIS — T451X5D Adverse effect of antineoplastic and immunosuppressive drugs, subsequent encounter: Secondary | ICD-10-CM | POA: Diagnosis not present

## 2019-11-25 DIAGNOSIS — I10 Essential (primary) hypertension: Secondary | ICD-10-CM | POA: Diagnosis not present

## 2019-11-25 DIAGNOSIS — C50412 Malignant neoplasm of upper-outer quadrant of left female breast: Secondary | ICD-10-CM | POA: Diagnosis not present

## 2019-11-25 DIAGNOSIS — C182 Malignant neoplasm of ascending colon: Secondary | ICD-10-CM | POA: Diagnosis not present

## 2019-11-27 ENCOUNTER — Telehealth: Payer: Self-pay

## 2019-11-27 NOTE — Telephone Encounter (Signed)
Spoke with pt, regarding what happened while at clinic Friday 11/24/19. Pt denies any pain, other than a sore throat that was addressed by MD during last office visit. Pt denies any concerns at this time, other than transportation to MRI on 11/28/19. Message sent to MD nurse to contact pt regarding transportation. Pt aware that MD nurse will contact her with further information.

## 2019-11-28 ENCOUNTER — Other Ambulatory Visit: Payer: Self-pay

## 2019-11-28 ENCOUNTER — Ambulatory Visit
Admission: RE | Admit: 2019-11-28 | Discharge: 2019-11-28 | Disposition: A | Discharge status: Home / Self Care | Payer: Medicare HMO | Source: Ambulatory Visit | Attending: Internal Medicine | Admitting: Internal Medicine

## 2019-11-28 ENCOUNTER — Ambulatory Visit: Payer: Medicare HMO | Admitting: Gastroenterology

## 2019-11-28 ENCOUNTER — Telehealth: Payer: Self-pay | Admitting: *Deleted

## 2019-11-28 DIAGNOSIS — K7689 Other specified diseases of liver: Secondary | ICD-10-CM | POA: Diagnosis not present

## 2019-11-28 DIAGNOSIS — Z170 Estrogen receptor positive status [ER+]: Secondary | ICD-10-CM | POA: Diagnosis not present

## 2019-11-28 DIAGNOSIS — J439 Emphysema, unspecified: Secondary | ICD-10-CM | POA: Diagnosis not present

## 2019-11-28 DIAGNOSIS — C50412 Malignant neoplasm of upper-outer quadrant of left female breast: Secondary | ICD-10-CM | POA: Diagnosis not present

## 2019-11-28 DIAGNOSIS — C50919 Malignant neoplasm of unspecified site of unspecified female breast: Secondary | ICD-10-CM | POA: Diagnosis not present

## 2019-11-28 MED ORDER — IOHEXOL 300 MG/ML  SOLN
100.0000 mL | Freq: Once | INTRAMUSCULAR | Status: AC | PRN
  Administered 2019-11-28: 100 mL via INTRAVENOUS

## 2019-11-28 NOTE — Telephone Encounter (Signed)
Called report  IMPRESSION: 1. Interval decrease in size of cavitary lung mass within the left upper lobe compatible with response to therapy. 2. Interval decrease in size of multi focal liver lesions compatible with response to therapy. 3. Emphysema and aortic atherosclerosis. 4. Multi vessel coronary artery calcifications. 5. The IVC, proximal to the filter, appears distended and filled with thrombus which extends from bilateral lower extremities through the iliac veins.  Aortic Atherosclerosis (ICD10-I70.0) and Emphysema (ICD10-J43.9).  These results will be called to the ordering clinician or representative by the Radiologist Assistant, and communication documented in the PACS or zVision Dashboard.   Electronically Signed   By: Kerby Moors M.D.   On: 11/28/2019 14:41

## 2019-11-29 ENCOUNTER — Other Ambulatory Visit: Payer: Self-pay

## 2019-11-29 ENCOUNTER — Ambulatory Visit
Admission: RE | Admit: 2019-11-29 | Discharge: 2019-11-29 | Disposition: A | Discharge status: Home / Self Care | Payer: Medicare HMO | Source: Ambulatory Visit | Attending: Internal Medicine | Admitting: Internal Medicine

## 2019-11-29 ENCOUNTER — Telehealth: Payer: Self-pay | Admitting: Internal Medicine

## 2019-11-29 DIAGNOSIS — Z0189 Encounter for other specified special examinations: Secondary | ICD-10-CM

## 2019-11-29 DIAGNOSIS — Z170 Estrogen receptor positive status [ER+]: Secondary | ICD-10-CM

## 2019-11-29 DIAGNOSIS — Z01818 Encounter for other preprocedural examination: Secondary | ICD-10-CM | POA: Diagnosis not present

## 2019-11-29 DIAGNOSIS — I471 Supraventricular tachycardia: Secondary | ICD-10-CM | POA: Insufficient documentation

## 2019-11-29 DIAGNOSIS — C50412 Malignant neoplasm of upper-outer quadrant of left female breast: Secondary | ICD-10-CM

## 2019-11-29 DIAGNOSIS — I219 Acute myocardial infarction, unspecified: Secondary | ICD-10-CM | POA: Insufficient documentation

## 2019-11-29 DIAGNOSIS — I10 Essential (primary) hypertension: Secondary | ICD-10-CM | POA: Diagnosis not present

## 2019-11-29 DIAGNOSIS — Z5111 Encounter for antineoplastic chemotherapy: Secondary | ICD-10-CM

## 2019-11-29 NOTE — Progress Notes (Signed)
*  PRELIMINARY RESULTS* Echocardiogram 2D Echocardiogram has been performed.  Sherrie Sport 11/29/2019, 9:31 AM

## 2019-11-29 NOTE — Telephone Encounter (Signed)
On 3/2-spoke to patient regarding results of the CT scan.  Clot burden/IVC filter-however patient does not show any worsening leg swelling.  She continues to tolerate Eliquis fairly well.  Discussed that if worsening to let us know the option would be Lovenox.  We will also discuss with vascular surgery.

## 2019-11-30 ENCOUNTER — Encounter: Payer: Self-pay | Admitting: Internal Medicine

## 2019-11-30 ENCOUNTER — Other Ambulatory Visit: Payer: Self-pay

## 2019-11-30 DIAGNOSIS — C182 Malignant neoplasm of ascending colon: Secondary | ICD-10-CM | POA: Diagnosis not present

## 2019-11-30 DIAGNOSIS — I10 Essential (primary) hypertension: Secondary | ICD-10-CM | POA: Diagnosis not present

## 2019-11-30 DIAGNOSIS — C184 Malignant neoplasm of transverse colon: Secondary | ICD-10-CM | POA: Diagnosis not present

## 2019-11-30 DIAGNOSIS — C50412 Malignant neoplasm of upper-outer quadrant of left female breast: Secondary | ICD-10-CM | POA: Diagnosis not present

## 2019-11-30 DIAGNOSIS — D702 Other drug-induced agranulocytosis: Secondary | ICD-10-CM | POA: Diagnosis not present

## 2019-11-30 DIAGNOSIS — C787 Secondary malignant neoplasm of liver and intrahepatic bile duct: Secondary | ICD-10-CM | POA: Diagnosis not present

## 2019-11-30 DIAGNOSIS — G620 Drug-induced polyneuropathy: Secondary | ICD-10-CM | POA: Diagnosis not present

## 2019-11-30 DIAGNOSIS — C7931 Secondary malignant neoplasm of brain: Secondary | ICD-10-CM | POA: Diagnosis not present

## 2019-11-30 DIAGNOSIS — T451X5D Adverse effect of antineoplastic and immunosuppressive drugs, subsequent encounter: Secondary | ICD-10-CM | POA: Diagnosis not present

## 2019-12-01 ENCOUNTER — Inpatient Hospital Stay: Payer: Medicare HMO | Attending: Internal Medicine

## 2019-12-01 ENCOUNTER — Inpatient Hospital Stay (HOSPITAL_BASED_OUTPATIENT_CLINIC_OR_DEPARTMENT_OTHER): Payer: Medicare HMO | Admitting: Internal Medicine

## 2019-12-01 ENCOUNTER — Other Ambulatory Visit: Payer: Self-pay

## 2019-12-01 ENCOUNTER — Inpatient Hospital Stay: Payer: Medicare HMO

## 2019-12-01 DIAGNOSIS — C7931 Secondary malignant neoplasm of brain: Secondary | ICD-10-CM | POA: Diagnosis not present

## 2019-12-01 DIAGNOSIS — C7949 Secondary malignant neoplasm of other parts of nervous system: Secondary | ICD-10-CM | POA: Insufficient documentation

## 2019-12-01 DIAGNOSIS — Z86711 Personal history of pulmonary embolism: Secondary | ICD-10-CM | POA: Diagnosis not present

## 2019-12-01 DIAGNOSIS — Z79899 Other long term (current) drug therapy: Secondary | ICD-10-CM | POA: Diagnosis not present

## 2019-12-01 DIAGNOSIS — E785 Hyperlipidemia, unspecified: Secondary | ICD-10-CM | POA: Insufficient documentation

## 2019-12-01 DIAGNOSIS — Z923 Personal history of irradiation: Secondary | ICD-10-CM | POA: Diagnosis not present

## 2019-12-01 DIAGNOSIS — C787 Secondary malignant neoplasm of liver and intrahepatic bile duct: Secondary | ICD-10-CM | POA: Insufficient documentation

## 2019-12-01 DIAGNOSIS — Z9221 Personal history of antineoplastic chemotherapy: Secondary | ICD-10-CM | POA: Insufficient documentation

## 2019-12-01 DIAGNOSIS — Z7901 Long term (current) use of anticoagulants: Secondary | ICD-10-CM | POA: Diagnosis not present

## 2019-12-01 DIAGNOSIS — R7989 Other specified abnormal findings of blood chemistry: Secondary | ICD-10-CM | POA: Diagnosis not present

## 2019-12-01 DIAGNOSIS — Z170 Estrogen receptor positive status [ER+]: Secondary | ICD-10-CM | POA: Insufficient documentation

## 2019-12-01 DIAGNOSIS — I252 Old myocardial infarction: Secondary | ICD-10-CM | POA: Insufficient documentation

## 2019-12-01 DIAGNOSIS — G620 Drug-induced polyneuropathy: Secondary | ICD-10-CM | POA: Insufficient documentation

## 2019-12-01 DIAGNOSIS — Z7902 Long term (current) use of antithrombotics/antiplatelets: Secondary | ICD-10-CM | POA: Insufficient documentation

## 2019-12-01 DIAGNOSIS — Z8249 Family history of ischemic heart disease and other diseases of the circulatory system: Secondary | ICD-10-CM | POA: Insufficient documentation

## 2019-12-01 DIAGNOSIS — I10 Essential (primary) hypertension: Secondary | ICD-10-CM | POA: Insufficient documentation

## 2019-12-01 DIAGNOSIS — Z803 Family history of malignant neoplasm of breast: Secondary | ICD-10-CM | POA: Diagnosis not present

## 2019-12-01 DIAGNOSIS — C50412 Malignant neoplasm of upper-outer quadrant of left female breast: Secondary | ICD-10-CM

## 2019-12-01 DIAGNOSIS — Z833 Family history of diabetes mellitus: Secondary | ICD-10-CM | POA: Diagnosis not present

## 2019-12-01 DIAGNOSIS — C7800 Secondary malignant neoplasm of unspecified lung: Secondary | ICD-10-CM | POA: Diagnosis not present

## 2019-12-01 DIAGNOSIS — F17210 Nicotine dependence, cigarettes, uncomplicated: Secondary | ICD-10-CM | POA: Insufficient documentation

## 2019-12-01 DIAGNOSIS — I82401 Acute embolism and thrombosis of unspecified deep veins of right lower extremity: Secondary | ICD-10-CM | POA: Diagnosis not present

## 2019-12-01 DIAGNOSIS — Z808 Family history of malignant neoplasm of other organs or systems: Secondary | ICD-10-CM | POA: Diagnosis not present

## 2019-12-01 DIAGNOSIS — Z95828 Presence of other vascular implants and grafts: Secondary | ICD-10-CM

## 2019-12-01 LAB — COMPREHENSIVE METABOLIC PANEL
ALT: 32 U/L (ref 0–44)
AST: 39 U/L (ref 15–41)
Albumin: 3.7 g/dL (ref 3.5–5.0)
Alkaline Phosphatase: 192 U/L — ABNORMAL HIGH (ref 38–126)
Anion gap: 10 (ref 5–15)
BUN: 15 mg/dL (ref 8–23)
CO2: 26 mmol/L (ref 22–32)
Calcium: 9.2 mg/dL (ref 8.9–10.3)
Chloride: 104 mmol/L (ref 98–111)
Creatinine, Ser: 0.83 mg/dL (ref 0.44–1.00)
GFR calc Af Amer: >60 mL/min (ref >60)
GFR calc non Af Amer: >60 mL/min (ref >60)
Glucose, Bld: 88 mg/dL (ref 70–99)
Potassium: 3.6 mmol/L (ref 3.5–5.1)
Sodium: 140 mmol/L (ref 135–145)
Total Bilirubin: 0.6 mg/dL (ref 0.3–1.2)
Total Protein: 6.8 g/dL (ref 6.5–8.1)

## 2019-12-01 LAB — CBC WITH DIFFERENTIAL/PLATELET
Abs Immature Granulocytes: 0.01 10*3/uL (ref 0.00–0.07)
Basophils Absolute: 0 10*3/uL (ref 0.0–0.1)
Basophils Relative: 1 %
Eosinophils Absolute: 0 10*3/uL (ref 0.0–0.5)
Eosinophils Relative: 0 %
HCT: 33.6 % — ABNORMAL LOW (ref 36.0–46.0)
Hemoglobin: 10.6 g/dL — ABNORMAL LOW (ref 12.0–15.0)
Immature Granulocytes: 1 %
Lymphocytes Relative: 33 %
Lymphs Abs: 0.7 10*3/uL (ref 0.7–4.0)
MCH: 32.1 pg (ref 26.0–34.0)
MCHC: 31.5 g/dL (ref 30.0–36.0)
MCV: 101.8 fL — ABNORMAL HIGH (ref 80.0–100.0)
Monocytes Absolute: 0.1 10*3/uL (ref 0.1–1.0)
Monocytes Relative: 6 %
Neutro Abs: 1.2 10*3/uL — ABNORMAL LOW (ref 1.7–7.7)
Neutrophils Relative %: 59 %
Platelets: 222 10*3/uL (ref 150–400)
RBC: 3.3 MIL/uL — ABNORMAL LOW (ref 3.87–5.11)
RDW: 19.7 % — ABNORMAL HIGH (ref 11.5–15.5)
WBC: 2 10*3/uL — ABNORMAL LOW (ref 4.0–10.5)
nRBC: 1 % — ABNORMAL HIGH (ref 0.0–0.2)

## 2019-12-01 MED ORDER — HEPARIN SOD (PORK) LOCK FLUSH 100 UNIT/ML IV SOLN
500.0000 [IU] | Freq: Once | INTRAVENOUS | Status: AC
  Administered 2019-12-01: 500 [IU]
  Filled 2019-12-01: qty 5

## 2019-12-01 NOTE — Progress Notes (Signed)
DISCONTINUE ON PATHWAY REGIMEN - Breast     A cycle is every 28 days (3 weeks on and 1 week off):     Nab-paclitaxel (protein bound)   **Always confirm dose/schedule in your pharmacy ordering system**  REASON: Toxicities / Adverse Event PRIOR TREATMENT: [id number]: Nab-Paclitaxel (Abraxane) 100 mg/m2 D1, 8, 15 q28 Days TREATMENT RESPONSE: Partial Response (PR)  START ON PATHWAY REGIMEN - Breast     A cycle is every 21 days:     Gemcitabine   **Always confirm dose/schedule in your pharmacy ordering system**  Patient Characteristics: Distant Metastases or Locoregional Recurrent Disease - Unresected or Locally Advanced Unresectable Disease Progressing after Neoadjuvant and Local Therapies, HER2 Negative/Unknown/Equivocal, ER Positive, Chemotherapy, Third Line and Beyond, Prior or  Contraindicated Anthracycline and Prior Eribulin Therapeutic Status: Distant Metastases BRCA Mutation Status: Absent ER Status: Positive (+) HER2 Status: Negative (-) PR Status: Negative (-) Line of Therapy: Third Line and Beyond Intent of Therapy: Non-Curative / Palliative Intent, Discussed with Patient

## 2019-12-01 NOTE — Progress Notes (Signed)
Holding chemotherapy today due to abn ejection fraction. Port needle d/c

## 2019-12-01 NOTE — Assessment & Plan Note (Addendum)
#  Recurrent metastatic breast cancer-lung/liver metastases ER PR positive HER-2 negative;On ADRIAMYCIN weekly.  Clinically stable.  CT scan March second 2021-partial response noted-improved liver lesions; lung lesion.  No new lesions.  #Hold Adriamycin cycle #7-because of decreased ejection fraction-40 to 45%; previously 65%. Will need alternative chemotherapy- discussed re: gemcitabine.  Discussed the potential side effects including but not limited to low blood counts fever skin rash.  # cardiomyopathy/ Sinus tachycardia- on metoprolol-/Tamabcor; [march 2d-echo- 40-45%]; clinically stable.   # Elevated LFTs-likely secondary to progressive malignancy;STABLE.  # Brain metastases; Recurrent-s/p RT finshed 11/17.  MRI- improved; clinically Stable.   # Peripheral neuropathy grade 1-2-  on Lyrica; stable  # Right lower extremity DVT bilateral-November 28, 2019-noted to have tumor involving the IVC filter.  Patient not a candidate for thrombectomy/lysis given history of brain metastases. On eliquis- clinically stable; hold switching to lovenox for now.  # s/p Fall- gait instability-interested in getting a Conservation officer, nature.  # DISPOSITION:  # HOLD Adriamycin today; de-access  # 2 week- MD; labs- cbc/cmp;GEMCITABINE [new]- Dr.B  # I reviewed the blood work- with the patient in detail; also reviewed the imaging independently [as summarized above]; and with the patient in detail.

## 2019-12-01 NOTE — Progress Notes (Signed)
[place] OFFICE PROGRESS NOTE  Patient Care Team: Delsa Grana, PA-C as PCP - General (Family Medicine) Wellington Hampshire, MD as PCP - Cardiology (Cardiology) Byrnett, Forest Gleason, MD (General Surgery) [name], MD (Inactive) as Attending Physician (Internal Medicine)  Cancer Staging No matching staging information was found for the patient.   Oncology History Overview Note  # LEFT BREAST IDC; STAGE II [cT2N1] ER >90%; PR- 50-90%; her 2 NEU- POS; s/p Neoadj chemo; AUG 2016- TCH+P s/p Lumpec & partial ALND- path CR; s/p RT [finished Nov 2016];  adj Herceptin; HELD for Jan 19th 2017 [in DEC 2016-EF dropped from 63 to 51%; FEB 27th EF-42.9%]; JAN 2016 START Arimidex; May 2017- EF- 60%; May 24th 2017-Re-start Herceptin q 3W; July 28th STOP herceptin [finished 4m/m2; sec to Low EF; however 2017 Oct EF= 67%; improved]  # DEC 4th 2017- Start Neratinib 4 pills; DEC 11th 3 pills; STOPPED.  # FEB 2018- RECURRENCE ER/PR positive; Her 2 NEGATIVE [liver Bx]  # MARCH 1st 2018- Tax-Cytoxan [poor tol]  # FEB 2018- Brain mets [SBRT; Dr.Crystal; finished Feb 28th 2018]  # March 2018- Eribulin- poor tolerance  # June 8th 2018- Started Faslodex + Abema [abema-multiple interruptions sec to neutropenia]  # MRI Brain- March 2019- Leptomeningeal mets [WBRT; No LP s/p WBRT- 01/09/2018]; STOP Abema [561mday-sec to severe cytopenia]+faslodex  # MAY 20tth 019- Start Xeloda; STOPPED in Nov 22nd 2019- sec to Progressive liver lesions on CT scan  # NOV 26th 2019- Faslodex + Piqaray 25017m; Jan 2020- Piqray 300 mg+ faslodex; FEB 17th CT scan- mixed response/ Overall progression; STOP Piqray + faslodex.   # FEB 25th 2020- ERIBULIN; Poor tolerance sec to severe neutropenia.  Stopped May 2020.  CT scan May 2020-new/progressive 2 cm liver lesion;   #July nd 2020-Ixempra x5 cycles-; NOV 2020-MRI liver- worsening liver lesions [rising AST/ALT].   # NOV [id number] weekly [consent-];  December 31-2020-progression/rising LFTs.  Stop Taxol  #January 15th, 2021-Adriamycin weekly [C]; MARCH 2021- Partial response. STOP ADRIAMYCIN sec to EF drop 40-45%.   #March 2021-gemcitabine chemotherapy  #May 06, 2019-MRI brain 5 new lesions [3 mm to 10 mm]-asymptomatic; [previous whole brain radiation]- OCT 2020- Re-Irradiation.   # PN- G-2; may 2017- Cymbalta 7m57mMARCH 2018-  acute vascular insuff of BIL LE [? Taxotere vasoconstriction on asprin]; # hemorragic shock LGIB [s/p colo; Dr.Wohl]  #  SVT- on flecainide.   # April 2016- Liver Bx- NEG;   # Drop in EF from Herceptin [recovered OCt 27th- EF 65%]; March 2020 ejection fraction 40 to 45%.  Stop Adriamycin.  # BMD- jan 2017- osteopenia  # 2020- SEP-s/p Pallaitive care evaluation  -----------------------------------------------------------     MOLE[place]wald- Sep 4th 2018- PDL-1 0%/NEG for BRCA; MSI-Stable; Positive for PI3K; ccnd-1; FGF** -----------------------------------------------------------  Dx: Metastatic Breast cancer [ER/PR-Pos; her -2 NEG] Stage IV; goals: palliative Current treatment-gemcitabine chemotherapy [C]   Carcinoma of upper-outer quadrant of left breast in female, estrogen receptor positive (HCC)[place]/25/2020 - 01/17/2019 Chemotherapy   The patient had eriBULin mesylate (HALAVEN) 2.1 mg in sodium chloride 0.9 % 100 mL chemo infusion, 1.2 mg/m2 = 2.1 mg (100 % of original dose 1.2 mg/m2), Intravenous,  Once, 3 of 4 cycles Dose modification: 1.2 mg/m2 (original dose 1.2 mg/m2, Cycle 1, Reason: Provider Judgment), 0.8 mg/m2 (original dose 1.2 mg/m2, Cycle 3, Reason: Provider Judgment) Administration: 2.1 mg (11/22/2018), 2.1 mg (11/29/2018), 2.1 mg (12/20/2018), 1.4 mg (01/17/2019)  for chemotherapy treatment.    03/30/2019 -  07/12/2019 Chemotherapy   The patient had pegfilgrastim (NEULASTA ONPRO KIT) injection 6 mg, 6 mg, Subcutaneous, Once, 5 of 8 cycles Administration: 6 mg (03/30/2019),  6 mg (04/20/2019), 6 mg (05/11/2019), 6 mg (06/22/2019), 6 mg (06/01/2019) ixabepilone (IXEMPRA) 60 mg in lactated ringers 250 mL chemo infusion, 33.5 mg/m2 = 57 mg (100 % of original dose 32 mg/m2), Intravenous,  Once, 5 of 8 cycles Dose modification: 32 mg/m2 (original dose 32 mg/m2, Cycle 1, Reason: Provider Judgment) Administration: 72 mg (04/20/2019), 72 mg (05/11/2019), 72 mg (06/22/2019), 72 mg (06/01/2019)  for chemotherapy treatment.    08/18/2019 - 09/28/2019 Chemotherapy   The patient had PACLitaxel (TAXOL) 150 mg in sodium chloride 0.9 % 250 mL chemo infusion (</= 57m/m2), 80 mg/m2 = 150 mg, Intravenous,  Once, 1 of 4 cycles Administration: 150 mg (08/18/2019), 150 mg (09/08/2019), 150 mg (09/15/2019)  for chemotherapy treatment.    10/13/2019 - 11/30/2019 Chemotherapy   The patient had DOXOrubicin (ADRIAMYCIN) chemo injection 38 mg, 20 mg/m2 = 38 mg, Intravenous,  Once, 6 of 9 cycles Administration: 38 mg (10/13/2019), 38 mg (10/20/2019), 38 mg (10/27/2019), 38 mg (11/17/2019), 38 mg (11/24/2019), 38 mg (11/10/2019) palonosetron (ALOXI) injection 0.25 mg, 0.25 mg, Intravenous,  Once, 6 of 9 cycles Administration: 0.25 mg (10/13/2019), 0.25 mg (10/20/2019), 0.25 mg (10/27/2019), 0.25 mg (11/17/2019), 0.25 mg (11/24/2019), 0.25 mg (11/10/2019)  for chemotherapy treatment.    12/15/2019 -  Chemotherapy   The patient had gemcitabine (GEMZAR) 1,444 mg in sodium chloride 0.9 % 250 mL chemo infusion, 800 mg/m2, Intravenous,  Once, 0 of 4 cycles  for chemotherapy treatment.      INTERVAL HISTORY:  BYULIANA VANDRUNEN89y.[id number].  female pleasant patient above history of metastatic breast cancer ER PR positive/leptomeningeal disease metastatic to brain-currently on Adriamycin weekly here for follow-up/review results the CT scan; also 2D echo.  Patient denies any worsening swelling in the legs.  Patient continues to be on Eliquis.  Denies any nausea vomiting.  Appetite is fair.  No difficulty swallowing.  Patient is  currently at home/discharge from rehab a week ago.  No headaches.  No falls.  Review of Systems  Constitutional: Positive for malaise/fatigue. Negative for chills, diaphoresis, fever and weight loss.  HENT: Negative for nosebleeds and sore throat.   Eyes: Negative for double vision.  Respiratory: Negative for cough, hemoptysis, sputum production, shortness of breath and wheezing.   Cardiovascular: Negative for chest pain, palpitations, orthopnea and leg swelling.  Gastrointestinal: Negative for abdominal pain, blood in stool, constipation, heartburn, melena, nausea and vomiting.  Genitourinary: Negative for dysuria, frequency and urgency.  Musculoskeletal: Positive for joint pain. Negative for back pain.  Skin: Negative for itching.  Neurological: Positive for tingling. Negative for dizziness, focal weakness, weakness and headaches.  Endo/Heme/Allergies: Does not bruise/bleed easily.  Psychiatric/Behavioral: Negative for depression. The patient is not nervous/anxious and does not have insomnia.     PAST MEDICAL HISTORY :  Past Medical History:  Diagnosis Date  . Chemotherapy-induced peripheral neuropathy ([place]   . Epistaxis    a. 11/2016 in setting of asa/plavix-->silver nitrate cauterization.  . GI bleed    a. 11/2016 Admission w/ GIB and hypovolemic shock req 3u PRBC's;  b. 11/2016 ECG: gastritis & nonbleeding peptic ulcer; c. 11/2016 Conlonoscopy: rectal and sigmoid colonic ulcers.  . Heart attack ([place]    a. 1998 Cath @ [facility]: reportedly no intervention required.  .Marland KitchenHerceptin-induced cardiomyopathy ([place]    a. In the setting of Herceptin Rx for breast  cancer (initiated 12/2014); b. 03/2015 MUGA EF 64%; b. 08/2015 MUGA: EF 51%; c. 10/2015 MUGA: EF 44%; d. 11/2015 Echo: EF 45-50%; e. 01/2016 MUGA: EF 60%; f. 06/2016 MUGA EF 65%; g. 10/2016 MUGA: EF 61%;  h. 12/2016 Echo: EF 55-60%, gr1 DD.  Marland Kitchen Hyperlipidemia   . Hypertension   . Neuropathy   . Possible PAD (peripheral artery disease) ([place])     a. 11/2016 LE cyanosis and weak pulses-->CTA w/o significant Ao-BiFem dzs. ? distal dzs-->ASA/Plavix initiated by vascular surgery.  Marland Kitchen PSVT (paroxysmal supraventricular tachycardia) ([place])    a. Dx 11/2016.  . Pulmonary embolism ([place])    a. 12/2016 CTA Chest: small nonocclusive PE in inferior segment of the Left lingula, somewhat eccentric filling defect suggesting chronic rather than acute embolic event; b. 11/6627 LE U/S:  No DVT; c. 12/2016 Echo: Nl RV fxn, nl PASP.  Marland Kitchen Recurrent Metastatic breast cancer ([place])    a. Dx 2016: Stage II, ER positive, PR positive, HER-2/neu overexpressing of the left breast-->chemo/radiation; b. 10/2016 CT Abd/pelvis: diffuse liver mets, ill defined sclerotic bone lesions-T12;  c. 10/2016 MRI brain: metastatic lesion along L temporal lobe (19x85m) w/ extensive surrounding edema & 564mmidline shift to right.  . Sepsis ([place]  . Sinus tachycardia     PAST SURGICAL HISTORY :   Past Surgical History:  Procedure Laterality Date  . BREAST BIOPSY Left 2016   Positive  . BREAST LUMPECTOMY WITH SENTINEL LYMPH NODE BIOPSY Left 05/23/2015   Procedure: LEFT BREAST WIDE EXCISION WITH AXILLARY DISSECTION, MASTOPLASTY ;  Surgeon: JeRobert BellowMD;  Location: [facility] ORS;  Service: General;  Laterality: Left;  . BREAST SURGERY Left 12/18/14   breast biopsy/INVASIVE DUCTAL CARCINOMA OF BREAST, NOTTINGHAM GRADE 2.  . Marland KitchenREAST SURGERY  05/23/2015.   Wide excision/mastoplasty, axillary dissection. No residual invasive cancer, positive for residual DCIS. 0/2 nodes identified on axillary dissection. (no SLN by technetium or methylene blue)  . CARDIAC CATHETERIZATION    . COLONOSCOPY WITH PROPOFOL N/A 12/10/2016   Procedure: COLONOSCOPY WITH PROPOFOL;  Surgeon: DaLucilla LameMD;  Location: [facility] ENDOSCOPY;  Service: Endoscopy;  Laterality: N/A;  . COLONOSCOPY WITH PROPOFOL N/A 02/13/2017   Procedure: COLONOSCOPY WITH PROPOFOL;  Surgeon: WoLucilla LameMD;  [facility];  Service:  Endoscopy;  Laterality: N/A;  . ESOPHAGOGASTRODUODENOSCOPY N/A 11/03/2019   Procedure: ESOPHAGOGASTRODUODENOSCOPY (EGD);  Surgeon: VaLin LandsmanMD;  [facility];  Service: Gastroenterology;  Laterality: N/A;  . ESOPHAGOGASTRODUODENOSCOPY (EGD) WITH PROPOFOL N/A 12/08/2016   Procedure: ESOPHAGOGASTRODUODENOSCOPY (EGD) WITH PROPOFOL;  Surgeon: DaLucilla LameMD;  Location: [facility] ENDOS[facility];  Service: Endoscopy;  Laterality: N/A;  . IVC FILTER INSERTION N/A 02/15/2017   Procedure: IVC Filter Insertion;  Surgeon: DeAlgernon HuxleyMD;  [facility];  Service: Cardiovascular;  Laterality: N/A;  . PORTACATH PLACEMENT Right 12-31-14   Dr ByBary Castilla  FAMILY HISTORY :   Family History  Problem Relation Age of Onset  . Breast cancer Maternal Aunt   . Breast cancer Cousin   . Brain cancer Maternal Uncle   . Diabetes Mother   . Hypertension Mother   . Stroke Mother     SOCIAL HISTORY:   Social History   Tobacco Use  . Smoking status: Light Tobacco Smoker    Packs/day: 0.50    Years: 18.00    Pack years: 9.00    Types: Cigarettes  . Smokeless tobacco: Never Used  Substance Use Topics  . Alcohol use: No  Alcohol/week: 0.0 standard drinks  . Drug use: No    ALLERGIES:  is allergic to no known allergies.  MEDICATIONS:  Current Outpatient Medications  Medication Sig Dispense Refill  . acetaminophen (TYLENOL) 650 MG CR tablet Take 650 mg by mouth every 8 (eight) hours as needed for pain.    Marland Kitchen apixaban (ELIQUIS) 5 MG TABS tablet Take 1 tablet (5 mg total) by mouth 2 (two) times daily. 60 tablet   . flecainide (TAMBOCOR) 50 MG tablet Take 1 tablet (50 mg total) by mouth 2 (two) times daily. 60 tablet 4  . ipratropium (ATROVENT) 0.03 % nasal spray Place 2 sprays into both nostrils every 12 (twelve) hours. 30 mL 12  . levocetirizine (XYZAL) 5 MG tablet Take 1 tablet (5 mg total) by mouth every evening. 30 tablet 2  . magnesium oxide (MAG-OX) 400 MG tablet Take 1  tablet (400 mg total) by mouth 2 (two) times daily. 60 tablet 5  . meclizine (ANTIVERT) 25 MG tablet Take 1 tablet (25 mg total) by mouth 3 (three) times daily as needed for dizziness. 30 tablet 0  . metoprolol tartrate (LOPRESSOR) 25 MG tablet Take 1 tablet (25 mg total) by mouth 2 (two) times daily. 60 tablet 1  . montelukast (SINGULAIR) 10 MG tablet Take 1 tablet (10 mg total) by mouth at bedtime. 60 tablet 3  . pregabalin (LYRICA) 100 MG capsule Take 1 capsule (100 mg total) by mouth 2 (two) times daily. 60 capsule 2  . Vitamin D, Ergocalciferol, (DRISDOL) 1.25 MG (50000 UNIT) CAPS capsule TAKE 1 CAPSULE (50,000 UNITS TOTAL) ONCE A WEEK BY MOUTH. 12 capsule 1   No current facility-administered medications for this visit.   Facility-Administered Medications Ordered in Other Visits  Medication Dose Route Frequency Provider Last Rate Last Admin  . 0.9 %  sodium chloride infusion   Intravenous Continuous [name], MD   New Bag at 12/10/16 1140  . 0.9 %  sodium chloride infusion   Intravenous Continuous Lloyd Huger, MD 999 mL/hr at 04/24/15 1520 New Bag at 05/23/15 0951  . 0.9 %  sodium chloride infusion   Intravenous Continuous Verlon Au, NP 999 mL/hr at 10/31/19 1522 New Bag at 10/31/19 1522  . heparin lock flush 100 unit/mL  500 Units Intravenous Once Berenzon, Dmitriy, MD      . sodium chloride 0.9 % injection 10 mL  10 mL Intravenous PRN [name], MD   10 mL at 04/04/15 1440  . sodium chloride flush (NS) 0.9 % injection 10 mL  10 mL Intravenous PRN Berenzon, Dmitriy, MD        PHYSICAL EXAMINATION: ECOG PERFORMANCE STATUS: 1 - Symptomatic but completely ambulatory  BP 120/83 (BP Location: Left Arm, Patient Position: Sitting, Cuff Size: Normal)   Pulse 84   Temp 98 F (36.7 C) (Tympanic)   Wt 153 lb 6.4 oz (69.6 kg)   BMI 24.76 kg/m   Filed Weights   12/01/19 0837  Weight: 153 lb 6.4 oz (69.6 kg)    Physical Exam  Constitutional: She is oriented to  person, place, and time and well-developed, well-nourished, and in no distress.  Patient is alone.  Walking by herself.  HENT:  Head: Normocephalic and atraumatic.  Mouth/Throat: Oropharynx is clear and moist. No oropharyngeal exudate.  Eyes: Pupils are equal, round, and reactive to light.  Cardiovascular: Normal rate and regular rhythm.  Pulmonary/Chest: Effort normal and breath sounds normal. No respiratory distress. She has no wheezes.  Abdominal: Soft.  Bowel sounds are normal. She exhibits no distension and no mass. There is no abdominal tenderness. There is no rebound and no guarding.  Musculoskeletal:        General: No tenderness. Normal range of motion.     Cervical back: Normal range of motion and neck supple.  Neurological: She is alert and oriented to person, place, and time.  Skin: Skin is warm.  Psychiatric: Affect normal.    LABORATORY DATA:  I have reviewed the data as listed    Component Value Date/Time   NA 140 12/01/2019 0816   NA 135 03/04/2017 0918   NA 135 01/08/2015 0850   K 3.6 12/01/2019 0816   K 3.7 01/08/2015 0850   CL 104 12/01/2019 0816   CL 101 01/08/2015 0850   CO2 26 12/01/2019 0816   CO2 26 01/08/2015 0850   GLUCOSE 88 12/01/2019 0816   GLUCOSE 161 (H) 01/08/2015 0850   BUN 15 12/01/2019 0816   BUN 8 03/04/2017 0918   BUN 17 01/08/2015 0850   CREATININE 0.83 12/01/2019 0816   CREATININE 0.82 01/22/2015 1559   CALCIUM 9.2 12/01/2019 0816   CALCIUM 9.3 01/08/2015 0850   PROT 6.8 12/01/2019 0816   PROT 6.4 03/04/2017 0918   PROT 6.8 01/22/2015 1559   ALBUMIN 3.7 12/01/2019 0816   ALBUMIN 2.5 (L) 03/04/2017 0918   ALBUMIN 3.9 01/22/2015 1559   AST 39 12/01/2019 0816   AST 34 01/22/2015 1559   ALT 32 12/01/2019 0816   ALT 42 01/22/2015 1559   ALKPHOS 192 (H) 12/01/2019 0816   ALKPHOS 157 (H) 01/22/2015 1559   BILITOT 0.6 12/01/2019 0816   BILITOT 6.2 (H) 03/04/2017 0918   BILITOT 0.2 (L) 01/22/2015 1559   GFRNONAA >60 12/01/2019 0816    GFRNONAA >60 01/22/2015 1559   GFRAA >60 12/01/2019 0816   GFRAA >60 01/22/2015 1559    No results found for: SPEP, UPEP  Lab Results  Component Value Date   WBC 2.0 (L) 12/01/2019   NEUTROABS 1.2 (L) 12/01/2019   HGB 10.6 (L) 12/01/2019   HCT 33.6 (L) 12/01/2019   MCV 101.8 (H) 12/01/2019   PLT 222 12/01/2019      Chemistry      Component Value Date/Time   NA 140 12/01/2019 0816   NA 135 03/04/2017 0918   NA 135 01/08/2015 0850   K 3.6 12/01/2019 0816   K 3.7 01/08/2015 0850   CL 104 12/01/2019 0816   CL 101 01/08/2015 0850   CO2 26 12/01/2019 0816   CO2 26 01/08/2015 0850   BUN 15 12/01/2019 0816   BUN 8 03/04/2017 0918   BUN 17 01/08/2015 0850   CREATININE 0.83 12/01/2019 0816   CREATININE 0.82 01/22/2015 1559      Component Value Date/Time   CALCIUM 9.2 12/01/2019 0816   CALCIUM 9.3 01/08/2015 0850   ALKPHOS 192 (H) 12/01/2019 0816   ALKPHOS 157 (H) 01/22/2015 1559   AST 39 12/01/2019 0816   AST 34 01/22/2015 1559   ALT 32 12/01/2019 0816   ALT 42 01/22/2015 1559   BILITOT 0.6 12/01/2019 0816   BILITOT 6.2 (H) 03/04/2017 0918   BILITOT 0.2 (L) 01/22/2015 1559       RADIOGRAPHIC STUDIES: I have personally reviewed the radiological images as listed and agreed with the findings in the report. No results found.   ASSESSMENT & PLAN:  Carcinoma of upper-outer quadrant of left breast in female, estrogen receptor positive ([place]) # Recurrent metastatic breast cancer-lung/liver  metastases ER PR positive HER-2 negative;On ADRIAMYCIN weekly.  Clinically stable.  CT scan March second 2021-partial response noted-improved liver lesions; lung lesion.  No new lesions.  #Hold Adriamycin cycle #7-because of decreased ejection fraction-40 to 45%; previously 65%. Will need alternative chemotherapy- discussed re: gemcitabine.  Discussed the potential side effects including but not limited to low blood counts fever skin rash.  # cardiomyopathy/ Sinus tachycardia- on  metoprolol-/Tamabcor; [march 2d-echo- 40-45%]; clinically stable.   # Elevated LFTs-likely secondary to progressive malignancy;STABLE.  # Brain metastases; Recurrent-s/p RT finshed 11/17.  MRI- improved; clinically Stable.   # Peripheral neuropathy grade 1-2-  on Lyrica; stable  # Right lower extremity DVT bilateral-November 28, 2019-noted to have tumor involving the IVC filter.  Patient not a candidate for thrombectomy/lysis given history of brain metastases. On eliquis- clinically stable; hold switching to lovenox for now.  # s/p Fall- gait instability-interested in getting a Conservation officer, nature.  # DISPOSITION:  # HOLD Adriamycin today; de-access  # 2 week- MD; labs- cbc/cmp;GEMCITABINE [new]- Dr.B  # I reviewed the blood work- with the patient in detail; also reviewed the imaging independently [as summarized above]; and with the patient in detail.     No orders of the defined types were placed in this encounter.  All questions were answered. The patient knows to call the clinic with any problems, questions or concerns.     Cammie Sickle, MD 12/04/2019 8:45 AM

## 2019-12-05 DIAGNOSIS — C787 Secondary malignant neoplasm of liver and intrahepatic bile duct: Secondary | ICD-10-CM | POA: Diagnosis not present

## 2019-12-05 DIAGNOSIS — C50412 Malignant neoplasm of upper-outer quadrant of left female breast: Secondary | ICD-10-CM | POA: Diagnosis not present

## 2019-12-05 DIAGNOSIS — C7931 Secondary malignant neoplasm of brain: Secondary | ICD-10-CM | POA: Diagnosis not present

## 2019-12-05 DIAGNOSIS — C182 Malignant neoplasm of ascending colon: Secondary | ICD-10-CM | POA: Diagnosis not present

## 2019-12-05 DIAGNOSIS — T451X5D Adverse effect of antineoplastic and immunosuppressive drugs, subsequent encounter: Secondary | ICD-10-CM | POA: Diagnosis not present

## 2019-12-05 DIAGNOSIS — I10 Essential (primary) hypertension: Secondary | ICD-10-CM | POA: Diagnosis not present

## 2019-12-05 DIAGNOSIS — G620 Drug-induced polyneuropathy: Secondary | ICD-10-CM | POA: Diagnosis not present

## 2019-12-05 DIAGNOSIS — D702 Other drug-induced agranulocytosis: Secondary | ICD-10-CM | POA: Diagnosis not present

## 2019-12-05 DIAGNOSIS — C184 Malignant neoplasm of transverse colon: Secondary | ICD-10-CM | POA: Diagnosis not present

## 2019-12-06 ENCOUNTER — Telehealth: Payer: Self-pay | Admitting: *Deleted

## 2019-12-06 DIAGNOSIS — W19XXXA Unspecified fall, initial encounter: Secondary | ICD-10-CM

## 2019-12-06 DIAGNOSIS — Z5189 Encounter for other specified aftercare: Secondary | ICD-10-CM

## 2019-12-06 DIAGNOSIS — R29898 Other symptoms and signs involving the musculoskeletal system: Secondary | ICD-10-CM

## 2019-12-06 NOTE — Telephone Encounter (Signed)
Patient called reporting that she has fallen 3 days I a row. She is not hurt and did not lose conscientiousness, her legs are just giving out on her. She denies any other symptoms. Please advise

## 2019-12-06 NOTE — Telephone Encounter (Signed)
I have this patient scheduled for 915 lab then [facility], I spoke with [facility] and she will have the [employer] drivers pick her up since she does not have transportation otherwise. Labs entered per Dr B CBC, CMP, does she need a Mg+ added since she has leg weakness?

## 2019-12-06 NOTE — Telephone Encounter (Signed)
Pt needs to be seen in [facility] tomorrow; labs- cbc/cmp. GB

## 2019-12-07 ENCOUNTER — Inpatient Hospital Stay (HOSPITAL_BASED_OUTPATIENT_CLINIC_OR_DEPARTMENT_OTHER): Payer: Medicare HMO | Admitting: Oncology

## 2019-12-07 ENCOUNTER — Other Ambulatory Visit: Payer: Medicare HMO

## 2019-12-07 ENCOUNTER — Inpatient Hospital Stay: Payer: Medicare HMO

## 2019-12-07 ENCOUNTER — Encounter: Payer: Medicare HMO | Admitting: Oncology

## 2019-12-07 VITALS — BP 99/73 | HR 89 | Temp 98.0°F | Resp 16

## 2019-12-07 DIAGNOSIS — R29898 Other symptoms and signs involving the musculoskeletal system: Secondary | ICD-10-CM

## 2019-12-07 DIAGNOSIS — R296 Repeated falls: Secondary | ICD-10-CM | POA: Diagnosis not present

## 2019-12-07 DIAGNOSIS — C7931 Secondary malignant neoplasm of brain: Secondary | ICD-10-CM | POA: Diagnosis not present

## 2019-12-07 DIAGNOSIS — C50412 Malignant neoplasm of upper-outer quadrant of left female breast: Secondary | ICD-10-CM | POA: Diagnosis not present

## 2019-12-07 DIAGNOSIS — R7989 Other specified abnormal findings of blood chemistry: Secondary | ICD-10-CM | POA: Diagnosis not present

## 2019-12-07 DIAGNOSIS — I82401 Acute embolism and thrombosis of unspecified deep veins of right lower extremity: Secondary | ICD-10-CM | POA: Diagnosis not present

## 2019-12-07 DIAGNOSIS — Z170 Estrogen receptor positive status [ER+]: Secondary | ICD-10-CM | POA: Diagnosis not present

## 2019-12-07 DIAGNOSIS — C787 Secondary malignant neoplasm of liver and intrahepatic bile duct: Secondary | ICD-10-CM | POA: Diagnosis not present

## 2019-12-07 DIAGNOSIS — G620 Drug-induced polyneuropathy: Secondary | ICD-10-CM | POA: Diagnosis not present

## 2019-12-07 DIAGNOSIS — C7949 Secondary malignant neoplasm of other parts of nervous system: Secondary | ICD-10-CM | POA: Diagnosis not present

## 2019-12-07 DIAGNOSIS — Z5189 Encounter for other specified aftercare: Secondary | ICD-10-CM

## 2019-12-07 DIAGNOSIS — C7800 Secondary malignant neoplasm of unspecified lung: Secondary | ICD-10-CM | POA: Diagnosis not present

## 2019-12-07 DIAGNOSIS — Z95828 Presence of other vascular implants and grafts: Secondary | ICD-10-CM

## 2019-12-07 DIAGNOSIS — W19XXXA Unspecified fall, initial encounter: Secondary | ICD-10-CM

## 2019-12-07 LAB — CBC WITH DIFFERENTIAL/PLATELET
Abs Immature Granulocytes: 0 10*3/uL (ref 0.00–0.07)
Basophils Absolute: 0 10*3/uL (ref 0.0–0.1)
Basophils Relative: 0 %
Eosinophils Absolute: 0 10*3/uL (ref 0.0–0.5)
Eosinophils Relative: 0 %
HCT: 40.1 % (ref 36.0–46.0)
Hemoglobin: 12.5 g/dL (ref 12.0–15.0)
Immature Granulocytes: 0 %
Lymphocytes Relative: 20 %
Lymphs Abs: 0.6 10*3/uL — ABNORMAL LOW (ref 0.7–4.0)
MCH: 31.6 pg (ref 26.0–34.0)
MCHC: 31.2 g/dL (ref 30.0–36.0)
MCV: 101.5 fL — ABNORMAL HIGH (ref 80.0–100.0)
Monocytes Absolute: 0.8 10*3/uL (ref 0.1–1.0)
Monocytes Relative: 26 %
Neutro Abs: 1.5 10*3/uL — ABNORMAL LOW (ref 1.7–7.7)
Neutrophils Relative %: 54 %
Platelets: 229 10*3/uL (ref 150–400)
RBC: 3.95 MIL/uL (ref 3.87–5.11)
RDW: 20.5 % — ABNORMAL HIGH (ref 11.5–15.5)
WBC: 2.9 10*3/uL — ABNORMAL LOW (ref 4.0–10.5)
nRBC: 0 % (ref 0.0–0.2)

## 2019-12-07 LAB — URINALYSIS, COMPLETE (UACMP) WITH MICROSCOPIC
Bacteria, UA: NONE SEEN
Bilirubin Urine: NEGATIVE
Glucose, UA: NEGATIVE mg/dL
Hgb urine dipstick: NEGATIVE
Ketones, ur: NEGATIVE mg/dL
Leukocytes,Ua: NEGATIVE
Nitrite: NEGATIVE
Protein, ur: NEGATIVE mg/dL
Specific Gravity, Urine: 1.004 — ABNORMAL LOW (ref 1.005–1.030)
pH: 7 (ref 5.0–8.0)

## 2019-12-07 LAB — COMPREHENSIVE METABOLIC PANEL
ALT: 43 U/L (ref 0–44)
AST: 47 U/L — ABNORMAL HIGH (ref 15–41)
Albumin: 3.9 g/dL (ref 3.5–5.0)
Alkaline Phosphatase: 223 U/L — ABNORMAL HIGH (ref 38–126)
Anion gap: 10 (ref 5–15)
BUN: 13 mg/dL (ref 8–23)
CO2: 25 mmol/L (ref 22–32)
Calcium: 8.8 mg/dL — ABNORMAL LOW (ref 8.9–10.3)
Chloride: 101 mmol/L (ref 98–111)
Creatinine, Ser: 0.86 mg/dL (ref 0.44–1.00)
GFR calc Af Amer: >60 mL/min (ref >60)
GFR calc non Af Amer: >60 mL/min (ref >60)
Glucose, Bld: 101 mg/dL — ABNORMAL HIGH (ref 70–99)
Potassium: 4.4 mmol/L (ref 3.5–5.1)
Sodium: 136 mmol/L (ref 135–145)
Total Bilirubin: 0.5 mg/dL (ref 0.3–1.2)
Total Protein: 7 g/dL (ref 6.5–8.1)

## 2019-12-07 MED ORDER — SODIUM CHLORIDE 0.9% FLUSH
10.0000 mL | INTRAVENOUS | Status: DC | PRN
  Administered 2019-12-07: 10 mL via INTRAVENOUS
  Filled 2019-12-07: qty 10

## 2019-12-07 MED ORDER — DEXAMETHASONE SODIUM PHOSPHATE 10 MG/ML IJ SOLN
10.0000 mg | Freq: Once | INTRAMUSCULAR | Status: AC
  Administered 2019-12-07: 10 mg via INTRAVENOUS

## 2019-12-07 MED ORDER — HEPARIN SOD (PORK) LOCK FLUSH 100 UNIT/ML IV SOLN
500.0000 [IU] | Freq: Once | INTRAVENOUS | Status: AC
  Administered 2019-12-07: 500 [IU] via INTRAVENOUS
  Filled 2019-12-07: qty 5

## 2019-12-07 MED ORDER — SODIUM CHLORIDE 0.9 % IV SOLN
Freq: Once | INTRAVENOUS | Status: AC
  Filled 2019-12-07: qty 250

## 2019-12-07 NOTE — Progress Notes (Signed)
Symptom Management Consult note [facility]  [phone] [phone]  Patient Care Team: Delsa Grana, PA-C as PCP - General (Family Medicine) Wellington Hampshire, MD as PCP - Cardiology (Cardiology) Bary Castilla, Forest Gleason, MD (General Surgery) [name], MD (Inactive) as Attending Physician (Internal Medicine)   Name of the patient: Sarah Horne  [id number]  06-03-1956   Date of visit: 12/07/2019   Diagnosis-metastatic breast  Chief complaint/ Reason for visit- Weakness in legs/falls  Heme/Onc history:  Oncology History Overview Note  # LEFT BREAST IDC; STAGE II [cT2N1] ER >90%; PR- 50-90%; her 2 NEU- POS; s/p Neoadj chemo; AUG 2016- TCH+P s/p Lumpec & partial ALND- path CR; s/p RT [finished Nov 2016];  adj Herceptin; HELD for Jan 19th 2017 [in DEC 2016-EF dropped from 63 to 51%; FEB 27th EF-42.9%]; JAN 2016 START Arimidex; May 2017- EF- 60%; May 24th 2017-Re-start Herceptin q 3W; July 28th STOP herceptin [finished 12m/m2; sec to Low EF; however 2017 Oct EF= 67%; improved]  # DEC 4th 2017- Start Neratinib 4 pills; DEC 11th 3 pills; STOPPED.  # FEB 2018- RECURRENCE ER/PR positive; Her 2 NEGATIVE [liver Bx]  # MARCH 1st 2018- Tax-Cytoxan [poor tol]  # FEB 2018- Brain mets [SBRT; Dr.Crystal; finished Feb 28th 2018]  # March 2018- Eribulin- poor tolerance  # June 8th 2018- Started Faslodex + Abema [abema-multiple interruptions sec to neutropenia]  # MRI Brain- March 2019- Leptomeningeal mets [WBRT; No LP s/p WBRT- 01/09/2018]; STOP Abema [534mday-sec to severe cytopenia]+faslodex  # MAY 20tth 019- Start Xeloda; STOPPED in Nov 22nd 2019- sec to Progressive liver lesions on CT scan  # NOV 26th 2019- Faslodex + Piqaray 25014m; Jan 2020- Piqray 300 mg+ faslodex; FEB 17th CT scan- mixed response/ Overall progression; STOP Piqray + faslodex.   # FEB 25th 2020- ERIBULIN; Poor tolerance sec to severe neutropenia.  Stopped May 2020.  CT  scan May 2020-new/progressive 2 cm liver lesion;   #July nd 2020-Ixempra x5 cycles-; NOV 2020-MRI liver- worsening liver lesions [rising AST/ALT].   # NOV [id number] weekly [consent-]; December 31-2020-progression/rising LFTs.  Stop Taxol  #January 15th, 2021-Adriamycin weekly [C]; MARCH 2021- Partial response. STOP ADRIAMYCIN sec to EF drop 40-45%.   #March 2021-gemcitabine chemotherapy  #May 06, 2019-MRI brain 5 new lesions [3 mm to 10 mm]-asymptomatic; [previous whole brain radiation]- OCT 2020- Re-Irradiation.   # PN- G-2; may 2017- Cymbalta 37m3mMARCH 2018-  acute vascular insuff of BIL LE [? Taxotere vasoconstriction on asprin]; # hemorragic shock LGIB [s/p colo; Dr.Wohl]  #  SVT- on flecainide.   # April 2016- Liver Bx- NEG;   # Drop in EF from Herceptin [recovered OCt 27th- EF 65%]; March 2020 ejection fraction 40 to 45%.  Stop Adriamycin.  # BMD- jan 2017- osteopenia  # 2020- SEP-s/p Pallaitive care evaluation  -----------------------------------------------------------     MOLECULAR TESTING- [facility]- Sep 4th 2018- PDL-1 0%/NEG for BRCA; MSI-Stable; Positive for PI3K; ccnd-1; FGF** -----------------------------------------------------------  Dx: Metastatic Breast cancer [ER/PR-Pos; her -2 NEG] Stage IV; goals: palliative Current treatment-gemcitabine chemotherapy [C]   Carcinoma of upper-outer quadrant of left breast in female, estrogen receptor positive (HCC)[place]/25/2020 - 01/17/2019 Chemotherapy   The patient had eriBULin mesylate (HALAVEN) 2.1 mg in sodium chloride 0.9 % 100 mL chemo infusion, 1.2 mg/m2 = 2.1 mg (100 % of original dose 1.2 mg/m2), Intravenous,  Once, 3 of 4 cycles Dose modification: 1.2 mg/m2 (original dose 1.2 mg/m2, Cycle 1, Reason: Provider Judgment), 0.8 mg/m2 (  original dose 1.2 mg/m2, Cycle 3, Reason: Provider Judgment) Administration: 2.1 mg (11/22/2018), 2.1 mg (11/29/2018), 2.1 mg (12/20/2018), 1.4 mg (01/17/2019)  for chemotherapy  treatment.    03/30/2019 - 07/12/2019 Chemotherapy   The patient had pegfilgrastim (NEULASTA ONPRO KIT) injection 6 mg, 6 mg, Subcutaneous, Once, 5 of 8 cycles Administration: 6 mg (03/30/2019), 6 mg (04/20/2019), 6 mg (05/11/2019), 6 mg (06/22/2019), 6 mg (06/01/2019) ixabepilone (IXEMPRA) 60 mg in lactated ringers 250 mL chemo infusion, 33.5 mg/m2 = 57 mg (100 % of original dose 32 mg/m2), Intravenous,  Once, 5 of 8 cycles Dose modification: 32 mg/m2 (original dose 32 mg/m2, Cycle 1, Reason: Provider Judgment) Administration: 72 mg (04/20/2019), 72 mg (05/11/2019), 72 mg (06/22/2019), 72 mg (06/01/2019)  for chemotherapy treatment.    08/18/2019 - 09/28/2019 Chemotherapy   The patient had PACLitaxel (TAXOL) 150 mg in sodium chloride 0.9 % 250 mL chemo infusion (</= 47m/m2), 80 mg/m2 = 150 mg, Intravenous,  Once, 1 of 4 cycles Administration: 150 mg (08/18/2019), 150 mg (09/08/2019), 150 mg (09/15/2019)  for chemotherapy treatment.    10/13/2019 - 11/30/2019 Chemotherapy   The patient had DOXOrubicin (ADRIAMYCIN) chemo injection 38 mg, 20 mg/m2 = 38 mg, Intravenous,  Once, 6 of 9 cycles Administration: 38 mg (10/13/2019), 38 mg (10/20/2019), 38 mg (10/27/2019), 38 mg (11/17/2019), 38 mg (11/24/2019), 38 mg (11/10/2019) palonosetron (ALOXI) injection 0.25 mg, 0.25 mg, Intravenous,  Once, 6 of 9 cycles Administration: 0.25 mg (10/13/2019), 0.25 mg (10/20/2019), 0.25 mg (10/27/2019), 0.25 mg (11/17/2019), 0.25 mg (11/24/2019), 0.25 mg (11/10/2019)  for chemotherapy treatment.    12/15/2019 -  Chemotherapy   The patient had gemcitabine (GEMZAR) 1,444 mg in sodium chloride 0.9 % 250 mL chemo infusion, 800 mg/m2, Intravenous,  Once, 0 of 4 cycles  for chemotherapy treatment.     Interval history-patient presents to symptom management with above history for complaints of weakness in her legs resulting in frequent falls.  She is status post several lines of treatment for breast cancer.  Most recently she was treated with  Adriamycin which was discontinued early secondary to cardiac toxicity. Plan is start gemcitabine next week.   Patient notes progressive generalized weakness in bilateral lower extremities with shaking and frequent falls for approximately 4 weeks. Denies any additional symptoms.  No neuro concerns including dizziness or pre-syncope. She does not use assistive devices indoors. She has a rolling walker she uses outside.  She denies any injury except for minor bruising to right knee. She feels her appetite is stable and she continues to eat 1-2 meals daily with snacks inbetween.  She drinks fluids when "she thinks about it".  Patient is on anticoagulation with Eliquis for right lower extremity DVT diagnosed in March 2021.  She denies any bleeding.  She denies any pain.  She has good bowel movements and denies urinary concerns.  She denies any fevers.  ECOG FS:2 - Symptomatic, <50% confined to bed  Review of systems- Review of Systems  Constitutional: Positive for malaise/fatigue. Negative for chills, fever and weight loss.  HENT: Negative for congestion, ear pain and tinnitus.   Eyes: Negative.  Negative for blurred vision and double vision.  Respiratory: Negative.  Negative for cough, sputum production and shortness of breath.   Cardiovascular: Negative.  Negative for chest pain, palpitations and leg swelling.  Gastrointestinal: Negative.  Negative for abdominal pain, constipation, diarrhea, nausea and vomiting.  Genitourinary: Negative for dysuria, frequency and urgency.  Musculoskeletal: Positive for falls. Negative for back pain.  Skin: Negative.  Negative for rash.  Neurological: Positive for weakness. Negative for headaches.  Endo/Heme/Allergies: Negative.  Does not bruise/bleed easily.  Psychiatric/Behavioral: Negative.  Negative for depression. The patient is not nervous/anxious and does not have insomnia.      Current treatment-status post 6 cycles of Adriamycin discontinued early secondary  to cardiac toxicity.  We will begin gemcitabine in the next couple weeks.  Allergies  Allergen Reactions  . No Known Allergies      Past Medical History:  Diagnosis Date  . Chemotherapy-induced peripheral neuropathy ([place])   . Epistaxis    a. 11/2016 in setting of asa/plavix-->silver nitrate cauterization.  . GI bleed    a. 11/2016 Admission w/ GIB and hypovolemic shock req 3u PRBC's;  b. 11/2016 ECG: gastritis & nonbleeding peptic ulcer; c. 11/2016 Conlonoscopy: rectal and sigmoid colonic ulcers.  . Heart attack ([place])    a. 1998 Cath @ [facility]: reportedly no intervention required.  Marland Kitchen Herceptin-induced cardiomyopathy ([place])    a. In the setting of Herceptin Rx for breast cancer (initiated 12/2014); b. 03/2015 MUGA EF 64%; b. 08/2015 MUGA: EF 51%; c. 10/2015 MUGA: EF 44%; d. 11/2015 Echo: EF 45-50%; e. 01/2016 MUGA: EF 60%; f. 06/2016 MUGA EF 65%; g. 10/2016 MUGA: EF 61%;  h. 12/2016 Echo: EF 55-60%, gr1 DD.  Marland Kitchen Hyperlipidemia   . Hypertension   . Neuropathy   . Possible PAD (peripheral artery disease) ([place])    a. 11/2016 LE cyanosis and weak pulses-->CTA w/o significant Ao-BiFem dzs. ? distal dzs-->ASA/Plavix initiated by vascular surgery.  Marland Kitchen PSVT (paroxysmal supraventricular tachycardia) ([place])    a. Dx 11/2016.  . Pulmonary embolism ([place])    a. 12/2016 CTA Chest: small nonocclusive PE in inferior segment of the Left lingula, somewhat eccentric filling defect suggesting chronic rather than acute embolic event; b. 05/8337 LE U/S:  No DVT; c. 12/2016 Echo: Nl RV fxn, nl PASP.  Marland Kitchen Recurrent Metastatic breast cancer ([place])    a. Dx 2016: Stage II, ER positive, PR positive, HER-2/neu overexpressing of the left breast-->chemo/radiation; b. 10/2016 CT Abd/pelvis: diffuse liver mets, ill defined sclerotic bone lesions-T12;  c. 10/2016 MRI brain: metastatic lesion along L temporal lobe (19x40m) w/ extensive surrounding edema & 542mmidline shift to right.  . [place]is [place]  . Sinus tachycardia      Past Surgical  History:  Procedure Laterality Date  . BREAST BIOPSY Left 2016   Positive  . BREAST LUMPECTOMY WITH SENTINEL LYMPH NODE BIOPSY Left 05/23/2015   Procedure: LEFT BREAST WIDE EXCISION WITH AXILLARY DISSECTION, MASTOPLASTY ;  Surgeon: JeRobert BellowMD;  Location: [facility] ORS;  Service: General;  Laterality: Left;  . BREAST SURGERY Left 12/18/14   breast biopsy/INVASIVE DUCTAL CARCINOMA OF BREAST, NOTTINGHAM GRADE 2.  . Marland KitchenREAST SURGERY  05/23/2015.   Wide excision/mastoplasty, axillary dissection. No residual invasive cancer, positive for residual DCIS. 0/2 nodes identified on axillary dissection. (no SLN by technetium or methylene blue)  . CARDIAC CATHETERIZATION    . COLONOSCOPY WITH PROPOFOL N/A 12/10/2016   Procedure: COLONOSCOPY WITH PROPOFOL;  Surgeon: DaLucilla LameMD;  Location: [facility] ENDOSCOPY;  Service: Endoscopy;  Laterality: N/A;  . COLONOSCOPY WITH PROPOFOL N/A 02/13/2017   Procedure: COLONOSCOPY WITH PROPOFOL;  Surgeon: WoLucilla LameMD;  [facility];  Service: Endoscopy;  Laterality: N/A;  . ESOPHAGOGASTRODUODENOSCOPY N/A 11/03/2019   Procedure: ESOPHAGOGASTRODUODENOSCOPY (EGD);  Surgeon: VaLin LandsmanMD;  [facility];  Service: Gastroenterology;  Laterality: N/A;  . ESOPHAGOGASTRODUODENOSCOPY (EGD) WITH PROPOFOL N/A 12/08/2016  Procedure: ESOPHAGOGASTRODUODENOSCOPY (EGD) WITH PROPOFOL;  Surgeon: Lucilla Lame, MD;  Location: [facility] ENDOSCOPY;  Service: Endoscopy;  Laterality: N/A;  . IVC FILTER INSERTION N/A 02/15/2017   Procedure: IVC Filter Insertion;  Surgeon: Algernon Huxley, MD;  Location: [place] [facility];  Service: Cardiovascular;  Laterality: N/A;  . PORTACATH PLACEMENT Right 12-31-14   Dr Bary Castilla    Social History   Socioeconomic History  . Marital status: Divorced    Spouse name: Not on file  . Number of children: Not on file  . Years of education: Not on file  . Highest education level: Not on file  Occupational History  . Not on file    Tobacco Use  . Smoking status: Light Tobacco Smoker    Packs/day: 0.50    Years: 18.00    Pack years: 9.00    Types: Cigarettes  . Smokeless tobacco: Never Used  Substance and Sexual Activity  . Alcohol use: No    Alcohol/week: 0.0 standard drinks  . Drug use: No  . Sexual activity: Not on file  Other Topics Concern  . Not on file  Social History Narrative   Lives in [place] by herself.   Social Determinants of Health   Financial Resource Strain:   . Difficulty of Paying Living Expenses:   Food Insecurity:   . Worried About Charity fundraiser in the Last Year:   . Arboriculturist in the Last Year:   Transportation Needs:   . Film/video editor (Medical):   Marland Kitchen Lack of Transportation (Non-Medical):   Physical Activity:   . Days of Exercise per Week:   . Minutes of Exercise per Session:   Stress:   . Feeling of Stress :   Social Connections:   . Frequency of Communication with Friends and Family:   . Frequency of Social Gatherings with Friends and Family:   . Attends Religious Services:   . Active Member of Clubs or Organizations:   . Attends Archivist Meetings:   Marland Kitchen Marital Status:   Intimate Partner Violence:   . Fear of Current or Ex-Partner:   . Emotionally Abused:   Marland Kitchen Physically Abused:   . Sexually Abused:     Family History  Problem Relation Age of Onset  . Breast cancer Maternal Aunt   . Breast cancer Cousin   . Brain cancer Maternal Uncle   . Diabetes Mother   . Hypertension Mother   . Stroke Mother      Current Outpatient Medications:  .  acetaminophen (TYLENOL) 650 MG CR tablet, Take 650 mg by mouth every 8 (eight) hours as needed for pain., Disp: , Rfl:  .  apixaban (ELIQUIS) 5 MG TABS tablet, Take 1 tablet (5 mg total) by mouth 2 (two) times daily., Disp: 60 tablet, Rfl:  .  flecainide (TAMBOCOR) 50 MG tablet, Take 1 tablet (50 mg total) by mouth 2 (two) times daily., Disp: 60 tablet, Rfl: 4 .  ipratropium (ATROVENT) 0.03 %  nasal spray, Place 2 sprays into both nostrils every 12 (twelve) hours., Disp: 30 mL, Rfl: 12 .  levocetirizine (XYZAL) 5 MG tablet, Take 1 tablet (5 mg total) by mouth every evening., Disp: 30 tablet, Rfl: 2 .  magnesium oxide (MAG-OX) 400 MG tablet, Take 1 tablet (400 mg total) by mouth 2 (two) times daily., Disp: 60 tablet, Rfl: 5 .  meclizine (ANTIVERT) 25 MG tablet, Take 1 tablet (25 mg total) by mouth 3 (three) times daily as needed  for dizziness., Disp: 30 tablet, Rfl: 0 .  metoprolol tartrate (LOPRESSOR) 25 MG tablet, Take 1 tablet (25 mg total) by mouth 2 (two) times daily., Disp: 60 tablet, Rfl: 1 .  montelukast (SINGULAIR) 10 MG tablet, Take 1 tablet (10 mg total) by mouth at bedtime., Disp: 60 tablet, Rfl: 3 .  pregabalin (LYRICA) 100 MG capsule, Take 1 capsule (100 mg total) by mouth 2 (two) times daily., Disp: 60 capsule, Rfl: 2 .  Vitamin D, Ergocalciferol, (DRISDOL) 1.25 MG (50000 UNIT) CAPS capsule, TAKE 1 CAPSULE (50,000 UNITS TOTAL) ONCE A WEEK BY MOUTH., Disp: 12 capsule, Rfl: 1 No current facility-administered medications for this visit.  Facility-Administered Medications Ordered in Other Visits:  .  0.9 %  sodium chloride infusion, , Intravenous, Continuous, [name], MD, New Bag at 12/10/16 1140 .  0.9 %  sodium chloride infusion, , Intravenous, Continuous, Lloyd Huger, MD, Last Rate: 999 mL/hr at 04/24/15 1520, New Bag at 05/23/15 0951 .  0.9 %  sodium chloride infusion, , Intravenous, Continuous, Verlon Au, NP, Last Rate: 999 mL/hr at 10/31/19 1522, New Bag at 10/31/19 1522 .  heparin lock flush 100 unit/mL, 500 Units, Intravenous, Once, Berenzon, Dmitriy, MD .  sodium chloride 0.9 % injection 10 mL, 10 mL, Intravenous, PRN, Ma Hillock, Sandeep, MD, 10 mL at 04/04/15 1440 .  sodium chloride flush (NS) 0.9 % injection 10 mL, 10 mL, Intravenous, PRN, Roxana Hires, MD  Physical exam:  Vitals:   12/07/19 1110  BP: 99/73  Pulse: 89  Resp: 16  Temp: 98  F (36.7 C)  TempSrc: Tympanic  SpO2: 98%   Physical Exam Constitutional:      Appearance: Normal appearance.  HENT:     Head: Normocephalic and atraumatic.  Eyes:     Pupils: Pupils are equal, round, and reactive to light.  Cardiovascular:     Rate and Rhythm: Normal rate and regular rhythm.     Heart sounds: Normal heart sounds. No murmur.  Pulmonary:     Effort: Pulmonary effort is normal.     Breath sounds: Normal breath sounds. No wheezing.  Abdominal:     General: Bowel sounds are normal. There is no distension.     Palpations: Abdomen is soft.     Tenderness: There is no abdominal tenderness.  Musculoskeletal:        General: Normal range of motion.     Cervical back: Normal range of motion.     Right knee: Ecchymosis present.  Skin:    General: Skin is warm and dry.     Findings: No rash.  Neurological:     Mental Status: She is alert and oriented to person, place, and time.  Psychiatric:        Judgment: Judgment normal.      CMP Latest Ref Rng & Units 12/07/2019  Glucose 70 - 99 mg/dL 101(H)  BUN 8 - 23 mg/dL 13  Creatinine 0.44 - 1.00 mg/dL 0.86  Sodium 135 - 145 mmol/L 136  Potassium 3.5 - 5.1 mmol/L 4.4  Chloride 98 - 111 mmol/L 101  CO2 22 - 32 mmol/L 25  Calcium 8.9 - 10.3 mg/dL 8.8(L)  Total Protein 6.5 - 8.1 g/dL 7.0  Total Bilirubin 0.3 - 1.2 mg/dL 0.5  Alkaline Phos 38 - 126 U/L 223(H)  AST 15 - 41 U/L 47(H)  ALT 0 - 44 U/L 43   CBC Latest Ref Rng & Units 12/07/2019  WBC 4.0 - 10.5 K/uL 2.9(L)  Hemoglobin  12.0 - 15.0 g/dL 12.5  Hematocrit 36.0 - 46.0 % 40.1  Platelets 150 - 400 K/uL 229    No images are attached to the encounter.  CT Chest W Contrast  Result Date: 11/28/2019 CLINICAL DATA:  Restaging metastatic breast cancer. EXAM: CT CHEST, ABDOMEN, AND PELVIS WITH CONTRAST TECHNIQUE: Multidetector CT imaging of the chest, abdomen and pelvis was performed following the standard protocol during bolus administration of intravenous contrast.  CONTRAST:  161m OMNIPAQUE IOHEXOL 300 MG/ML  SOLN COMPARISON:  CT angio chest 10/05/2019 MR abdomen 08/15/2020 And PET-CT 06/20/19 FINDINGS: CT CHEST FINDINGS Cardiovascular: The heart size appears mildly enlarged. No pericardial effusion. Mild aortic atherosclerosis. Lad and RCA coronary artery calcifications. Mediastinum/Nodes: Normal appearance of the thyroid gland. The trachea appears patent and is midline. Normal appearance of the esophagus. No enlarged axillary, supraclavicular, mediastinal, or hilar lymph nodes. Lungs/Pleura: Centrilobular emphysema. No pleural effusion. Cavitary lung mass within the left upper lobe measures 3.4 x 2.9 cm, image 17/4. Previously 4.5 x 3.8 cm. Surrounding interlobular septal thickening is identified which may represent lymphangitic spread of disease. Musculoskeletal: No chest wall mass or suspicious bone lesions identified. CT ABDOMEN PELVIS FINDINGS Hepatobiliary: Pseudo cirrhotic appearance of the liver is of again identified. Index lesion in lateral segment of left lobe of liver measures 1.5 x 1.1 cm, image 57/2. Decreased from 1.9 x 1.7 cm. Index lesion within posterior right hepatic lobe measures 1.4 x 2.3 cm, image 46/2. Previously 2.1 x 2.6 cm. The subcapsular lesion within medial segment of left lobe of liver measures 1.0 x 0.4 cm, image 61/2. Previously 1.8 x 0.9 cm. Gallbladder negative. No biliary ductal dilatation. Pancreas: Unremarkable. No pancreatic ductal dilatation or surrounding inflammatory changes. Spleen: Normal in size without focal abnormality. Adrenals/Urinary Tract: Normal appearance of the adrenal glands. No kidney mass or hydronephrosis. Urinary bladder is unremarkable. Stomach/Bowel: Stomach is within normal limits. No evidence of bowel wall thickening, distention, or inflammatory changes. Vascular/Lymphatic: Aortic atherosclerosis. No aneurysm. IVC filter is in place. There is thrombus identified throughout the IVC, bilateral iliac veins and the  visualized portions of the femoral veins. No abdominopelvic adenopathy identified. Reproductive: Fibroid uterus. No adnexal mass. Other: No free fluid or fluid collections. Musculoskeletal: No acute or significant osseous findings. IMPRESSION: 1. Interval decrease in size of cavitary lung mass within the left upper lobe compatible with response to therapy. 2. Interval decrease in size of multi focal liver lesions compatible with response to therapy. 3. Emphysema and aortic atherosclerosis. 4. Multi vessel coronary artery calcifications. 5. The IVC, proximal to the filter, appears distended and filled with thrombus which extends from bilateral lower extremities through the iliac veins. Aortic Atherosclerosis (ICD10-I70.0) and Emphysema (ICD10-J43.9). These results will be called to the ordering clinician or representative by the Radiologist Assistant, and communication documented in the PACS or zVision Dashboard. Electronically Signed   By: TKerby MoorsM.D.   On: 11/28/2019 14:41   CT ABDOMEN PELVIS W CONTRAST  Result Date: 11/28/2019 CLINICAL DATA:  Restaging metastatic breast cancer. EXAM: CT CHEST, ABDOMEN, AND PELVIS WITH CONTRAST TECHNIQUE: Multidetector CT imaging of the chest, abdomen and pelvis was performed following the standard protocol during bolus administration of intravenous contrast. CONTRAST:  1060mOMNIPAQUE IOHEXOL 300 MG/ML  SOLN COMPARISON:  CT angio chest 10/05/2019 MR abdomen 08/15/2020 And PET-CT 06/20/19 FINDINGS: CT CHEST FINDINGS Cardiovascular: The heart size appears mildly enlarged. No pericardial effusion. Mild aortic atherosclerosis. Lad and RCA coronary artery calcifications. Mediastinum/Nodes: Normal appearance of the thyroid gland. The trachea  appears patent and is midline. Normal appearance of the esophagus. No enlarged axillary, supraclavicular, mediastinal, or hilar lymph nodes. Lungs/Pleura: Centrilobular emphysema. No pleural effusion. Cavitary lung mass within the left  upper lobe measures 3.4 x 2.9 cm, image 17/4. Previously 4.5 x 3.8 cm. Surrounding interlobular septal thickening is identified which may represent lymphangitic spread of disease. Musculoskeletal: No chest wall mass or suspicious bone lesions identified. CT ABDOMEN PELVIS FINDINGS Hepatobiliary: Pseudo cirrhotic appearance of the liver is of again identified. Index lesion in lateral segment of left lobe of liver measures 1.5 x 1.1 cm, image 57/2. Decreased from 1.9 x 1.7 cm. Index lesion within posterior right hepatic lobe measures 1.4 x 2.3 cm, image 46/2. Previously 2.1 x 2.6 cm. The subcapsular lesion within medial segment of left lobe of liver measures 1.0 x 0.4 cm, image 61/2. Previously 1.8 x 0.9 cm. Gallbladder negative. No biliary ductal dilatation. Pancreas: Unremarkable. No pancreatic ductal dilatation or surrounding inflammatory changes. Spleen: Normal in size without focal abnormality. Adrenals/Urinary Tract: Normal appearance of the adrenal glands. No kidney mass or hydronephrosis. Urinary bladder is unremarkable. Stomach/Bowel: Stomach is within normal limits. No evidence of bowel wall thickening, distention, or inflammatory changes. Vascular/Lymphatic: Aortic atherosclerosis. No aneurysm. IVC filter is in place. There is thrombus identified throughout the IVC, bilateral iliac veins and the visualized portions of the femoral veins. No abdominopelvic adenopathy identified. Reproductive: Fibroid uterus. No adnexal mass. Other: No free fluid or fluid collections. Musculoskeletal: No acute or significant osseous findings. IMPRESSION: 1. Interval decrease in size of cavitary lung mass within the left upper lobe compatible with response to therapy. 2. Interval decrease in size of multi focal liver lesions compatible with response to therapy. 3. Emphysema and aortic atherosclerosis. 4. Multi vessel coronary artery calcifications. 5. The IVC, proximal to the filter, appears distended and filled with thrombus  which extends from bilateral lower extremities through the iliac veins. Aortic Atherosclerosis (ICD10-I70.0) and Emphysema (ICD10-J43.9). These results will be called to the ordering clinician or representative by the Radiologist Assistant, and communication documented in the PACS or zVision Dashboard. Electronically Signed   By: Kerby Moors M.D.   On: 11/28/2019 14:41   ECHOCARDIOGRAM COMPLETE  Result Date: 11/29/2019    ECHOCARDIOGRAM REPORT   Patient Name:   ANYIAH COVERDALE Date of Exam: 11/29/2019 Medical Rec #:  [id number]       Height:       66.0 in Accession #:    [id number]      Weight:       158.0 lb Date of Birth:  1956-02-20       BSA:          1.809 m Patient Age:    4 years        BP:           129/84 mmHg Patient Gender: F               HR:           90 bpm. Exam Location:  [facility] Procedure: 2D Echo, Cardiac Doppler and Color Doppler Indications:     Chemotherapy evaluation V87.41  History:         Patient has prior history of Echocardiogram examinations, most                  recent 01/13/2017. Risk Factors:Hypertension. Breast cancer,                  heart attack, PE, PSVT.  Sonographer:     Sherrie Sport RDCS (AE) Referring Phys:  [id number] Cammie Sickle Diagnosing Phys: Kate Sable MD  Sonographer Comments: No apical window. Image acquisition challenging due to mastectomy. IMPRESSIONS  1. Left ventricular ejection fraction, by estimation, is 40 to 45%. The left ventricle has mild to moderately decreased function. The left ventricle demonstrates global hypokinesis. Left ventricular diastolic function could not be evaluated.  2. Right ventricular systolic function is normal. The right ventricular size is normal.  3. The mitral valve is normal in structure and function. No evidence of mitral valve regurgitation.  4. The aortic valve is tricuspid. Aortic valve regurgitation is not visualized.  5. Pulmonic valve regurgitation not assessed. FINDINGS  Left Ventricle: Left ventricular ejection  fraction, by estimation, is 40 to 45%. The left ventricle has mild to moderately decreased function. The left ventricle demonstrates global hypokinesis. The left ventricular internal cavity size was normal in size. There is no left ventricular hypertrophy. Left ventricular diastolic function could not be evaluated. Right Ventricle: The right ventricular size is normal. Right vetricular wall thickness was not assessed. Right ventricular systolic function is normal. Left Atrium: Left atrial size was normal in size. Right Atrium: Right atrial size was not well visualized. Pericardium: There is no evidence of pericardial effusion. Mitral Valve: The mitral valve is normal in structure and function. No evidence of mitral valve regurgitation. Tricuspid Valve: The tricuspid valve is grossly normal. Tricuspid valve regurgitation is not demonstrated. Aortic Valve: The aortic valve is tricuspid. Aortic valve regurgitation is not visualized. Pulmonic Valve: The pulmonic valve was not well visualized. Pulmonic valve regurgitation not assessed. Aorta: The aortic root is normal in size and structure. IAS/Shunts: No atrial level shunt detected by color flow Doppler.  LEFT VENTRICLE PLAX 2D LVIDd:         4.53 cm LVIDs:         3.30 cm LV PW:         0.99 cm LV IVS:        0.92 cm LVOT diam:     2.00 cm LVOT Area:     3.14 cm  LEFT ATRIUM         Index LA diam:    2.20 cm 1.22 cm/m                        PULMONIC VALVE AORTA                 PV Vmax:        0.52 m/s Ao Root diam: 2.90 cm PV Peak grad:   1.1 mmHg                       RVOT Peak grad: 1 mmHg   SHUNTS Systemic Diam: 2.00 cm Kate Sable MD Electronically signed by Kate Sable MD Signature Date/Time: 11/29/2019/5:01:20 PM    Final    Assessment and plan- Patient is a 64 y.o. female who presents to symptom management with above history for frequent falls and weakness.  Unclear etiology for her frequent falls but likely related to dehydration and  deconditioning.  She has also been on and off steroids so steroid-induced myopathy is a differential. Patient states this has been progressive over the course of the past month.  She has home health physical therapy that was scheduled to begin today.  She admits to not being very active at home and likely contributing to her overall performance  status.  We will check her lab work and collect a UA to identify any abnormalities.  Will give her a liter of normal saline and 10 mg dexamethasone.  Hypotension noted while in clinic today.  Improved with IV fluids.  Recommend she use an assistive device while at home alone.  Will contact [facility] home [facility] to see if we can get her a rolling walker with a seat to prevent future falls.  I also recommend she drink adequate fluids.  We provided her with several supplements including Ensure electrolyte packets and Ensure protein shakes.  We will also have her evaluated by Luna Fuse next week in clinic for her recommendations and how patient can safely live at home alone.   Lab work overall is stable. UA negative. Vital signs after hydration stable. She is comfortable going home alone.  She does not request any additional support from the cancer center.  She knows to call with any worsening symptoms.  We spoke at length about being on anticoagulation and frequent falls.  Unfortunately we are unable to take her off Eliquis at this time given recent DVT and extent of thrombus.  Disposition: Referral placed for patient to be seen by Luna Fuse next Wednesday for OT evaluation. [facility] to contact patient to supply rolling walker with seat to prevent additional falls. RTC as scheduled on 12/15/2019 to see Dr. Yevette Edwards to initiate gemcitabine.   Visit Diagnosis 1. Weakness of both legs   2. Falls frequently     Patient expressed understanding and was in agreement with this plan. She also understands that She can call clinic at any time with any  questions, concerns, or complaints.   Greater than 50% was spent in counseling and coordination of care with this patient including but not limited to discussion of the relevant topics above (See A&P) including, but not limited to diagnosis and management of acute and chronic medical conditions.   Thank you for allowing me to participate in the care of this very pleasant patient.    Jacquelin Hawking, NP [place] [facility] Cell - [phone] Pager- [phone] 12/08/2019 12:28 PM

## 2019-12-08 DIAGNOSIS — C787 Secondary malignant neoplasm of liver and intrahepatic bile duct: Secondary | ICD-10-CM | POA: Diagnosis not present

## 2019-12-08 DIAGNOSIS — A419 Sepsis, unspecified organism: Secondary | ICD-10-CM | POA: Diagnosis not present

## 2019-12-08 DIAGNOSIS — D702 Other drug-induced agranulocytosis: Secondary | ICD-10-CM | POA: Diagnosis not present

## 2019-12-08 DIAGNOSIS — C184 Malignant neoplasm of transverse colon: Secondary | ICD-10-CM | POA: Diagnosis not present

## 2019-12-08 DIAGNOSIS — C50412 Malignant neoplasm of upper-outer quadrant of left female breast: Secondary | ICD-10-CM | POA: Diagnosis not present

## 2019-12-08 DIAGNOSIS — I10 Essential (primary) hypertension: Secondary | ICD-10-CM | POA: Diagnosis not present

## 2019-12-08 DIAGNOSIS — C7931 Secondary malignant neoplasm of brain: Secondary | ICD-10-CM | POA: Diagnosis not present

## 2019-12-08 DIAGNOSIS — G620 Drug-induced polyneuropathy: Secondary | ICD-10-CM | POA: Diagnosis not present

## 2019-12-08 DIAGNOSIS — C182 Malignant neoplasm of ascending colon: Secondary | ICD-10-CM | POA: Diagnosis not present

## 2019-12-08 DIAGNOSIS — T451X5D Adverse effect of antineoplastic and immunosuppressive drugs, subsequent encounter: Secondary | ICD-10-CM | POA: Diagnosis not present

## 2019-12-08 LAB — URINE CULTURE: Culture: <10000 — AB

## 2019-12-13 ENCOUNTER — Other Ambulatory Visit: Payer: Self-pay

## 2019-12-13 ENCOUNTER — Inpatient Hospital Stay: Payer: Medicare HMO | Admitting: Occupational Therapy

## 2019-12-13 DIAGNOSIS — R29898 Other symptoms and signs involving the musculoskeletal system: Secondary | ICD-10-CM

## 2019-12-13 DIAGNOSIS — C7931 Secondary malignant neoplasm of brain: Secondary | ICD-10-CM | POA: Diagnosis not present

## 2019-12-13 DIAGNOSIS — T451X5D Adverse effect of antineoplastic and immunosuppressive drugs, subsequent encounter: Secondary | ICD-10-CM | POA: Diagnosis not present

## 2019-12-13 DIAGNOSIS — I10 Essential (primary) hypertension: Secondary | ICD-10-CM | POA: Diagnosis not present

## 2019-12-13 DIAGNOSIS — G620 Drug-induced polyneuropathy: Secondary | ICD-10-CM | POA: Diagnosis not present

## 2019-12-13 DIAGNOSIS — C50412 Malignant neoplasm of upper-outer quadrant of left female breast: Secondary | ICD-10-CM | POA: Diagnosis not present

## 2019-12-13 DIAGNOSIS — C182 Malignant neoplasm of ascending colon: Secondary | ICD-10-CM | POA: Diagnosis not present

## 2019-12-13 DIAGNOSIS — I82401 Acute embolism and thrombosis of unspecified deep veins of right lower extremity: Secondary | ICD-10-CM | POA: Diagnosis not present

## 2019-12-13 DIAGNOSIS — C787 Secondary malignant neoplasm of liver and intrahepatic bile duct: Secondary | ICD-10-CM | POA: Diagnosis not present

## 2019-12-13 DIAGNOSIS — D702 Other drug-induced agranulocytosis: Secondary | ICD-10-CM | POA: Diagnosis not present

## 2019-12-13 NOTE — Therapy (Signed)
[facility] [address] [place], [place], [place], [address] Phone: [phone]   Fax:  [phone]  Occupational Therapy Screen  Patient Details  Name: Sarah Horne MRN: [id number] Date of Birth: Feb 14, 1956 No data recorded  Encounter Date: 12/13/2019  OT End of Session - 12/13/19 1254    Visit Number  0       Past Medical History:  Diagnosis Date  . Chemotherapy-induced peripheral neuropathy ([place])   . Epistaxis    a. 11/2016 in setting of asa/plavix-->silver nitrate cauterization.  . GI bleed    a. 11/2016 Admission w/ GIB and hypovolemic shock req 3u PRBC's;  b. 11/2016 ECG: gastritis & nonbleeding peptic ulcer; c. 11/2016 Conlonoscopy: rectal and sigmoid colonic ulcers.  . Heart attack ([place])    a. 1998 Cath @ [facility]: reportedly no intervention required.  Marland Kitchen Herceptin-induced cardiomyopathy ([place])    a. In the setting of Herceptin Rx for breast cancer (initiated 12/2014); b. 03/2015 MUGA EF 64%; b. 08/2015 MUGA: EF 51%; c. 10/2015 MUGA: EF 44%; d. 11/2015 Echo: EF 45-50%; e. 01/2016 MUGA: EF 60%; f. 06/2016 MUGA EF 65%; g. 10/2016 MUGA: EF 61%;  h. 12/2016 Echo: EF 55-60%, gr1 DD.  Marland Kitchen Hyperlipidemia   . Hypertension   . Neuropathy   . Possible PAD (peripheral artery disease) ([place])    a. 11/2016 LE cyanosis and weak pulses-->CTA w/o significant Ao-BiFem dzs. ? distal dzs-->ASA/Plavix initiated by vascular surgery.  Marland Kitchen PSVT (paroxysmal supraventricular tachycardia) ([place])    a. Dx 11/2016.  . Pulmonary embolism ([place])    a. 12/2016 CTA Chest: small nonocclusive PE in inferior segment of the Left lingula, somewhat eccentric filling defect suggesting chronic rather than acute embolic event; b. 01/9562 LE U/S:  No DVT; c. 12/2016 Echo: Nl RV fxn, nl PASP.  Marland Kitchen Recurrent Metastatic breast cancer ([place])    a. Dx 2016: Stage II, ER positive, PR positive, HER-2/neu overexpressing of the left breast-->chemo/radiation; b. 10/2016 CT Abd/pelvis: diffuse  liver mets, ill defined sclerotic bone lesions-T12;  c. 10/2016 MRI brain: metastatic lesion along L temporal lobe (19x83m) w/ extensive surrounding edema & 562mmidline shift to right.  . Sepsis ([place]  . Sinus tachycardia     Past Surgical History:  Procedure Laterality Date  . BREAST BIOPSY Left 2016   Positive  . BREAST LUMPECTOMY WITH SENTINEL LYMPH NODE BIOPSY Left 05/23/2015   Procedure: LEFT BREAST WIDE EXCISION WITH AXILLARY DISSECTION, MASTOPLASTY ;  Surgeon: Sarah BellowMD;  Location: [facility] ORS;  Service: General;  Laterality: Left;  . BREAST SURGERY Left 12/18/14   breast biopsy/INVASIVE DUCTAL CARCINOMA OF BREAST, NOTTINGHAM GRADE 2.  . Marland KitchenREAST SURGERY  05/23/2015.   Wide excision/mastoplasty, axillary dissection. No residual invasive cancer, positive for residual DCIS. 0/2 nodes identified on axillary dissection. (no SLN by technetium or methylene blue)  . CARDIAC CATHETERIZATION    . COLONOSCOPY WITH PROPOFOL N/A 12/10/2016   Procedure: COLONOSCOPY WITH PROPOFOL;  Surgeon: Sarah LameMD;  Location: [facility] ENDOSCOPY;  Service: Endoscopy;  Laterality: N/A;  . COLONOSCOPY WITH PROPOFOL N/A 02/13/2017   Procedure: COLONOSCOPY WITH PROPOFOL;  Surgeon: Sarah LameMD;  [facility];  Service: Endoscopy;  Laterality: N/A;  . ESOPHAGOGASTRODUODENOSCOPY N/A 11/03/2019   Procedure: ESOPHAGOGASTRODUODENOSCOPY (EGD);  Surgeon: Sarah LandsmanMD;  [facility];  Service: Gastroenterology;  Laterality: N/A;  . ESOPHAGOGASTRODUODENOSCOPY (EGD) WITH PROPOFOL N/A 12/08/2016   Procedure: ESOPHAGOGASTRODUODENOSCOPY (EGD) WITH PROPOFOL;  Surgeon: Sarah LameMD;  Location: [facility] ENDOSCOPY;  Service: Endoscopy;  Laterality: N/A;  . IVC FILTER INSERTION N/A 02/15/2017   Procedure: IVC Filter Insertion;  Surgeon: Sarah Huxley, MD;  Location: [facility];  Service: Cardiovascular;  Laterality: N/A;  . PORTACATH PLACEMENT Right 12-31-14   Dr Sarah Horne    There  were no vitals filed for this visit.  Subjective Assessment - 12/13/19 1253    Subjective   I fell about 4 times the last 3 wks - every time my legs give away - 2 times was standing at sink and othet time in yard and I could not get up for very long time - my brother took me to [employer] and my legs gave away nearly while standing at [employer]    Currently in Pain?  No/denies      Pt refer by Sarah Casa NP because of LE weakness and falls:  Sarah Horne's note from 12/07/2019:  Assessment and plan- Patient is a 64 y.o. female who presents to symptom management with above history for frequent falls and weakness.  Unclear etiology for her frequent falls but likely related to dehydration and deconditioning.  She has also been on and off steroids so steroid-induced myopathy is a differential. Patient states this has been progressive over the course of the past month.  She has home health physical therapy that was scheduled to begin today.  She admits to not being very active at home and likely contributing to her overall performance status.  We will check her lab work and collect a UA to identify any abnormalities.  Will give her a liter of normal saline and 10 mg dexamethasone.  Hypotension noted while in clinic today.  Improved with IV fluids.  Recommend she use an assistive device while at home alone.  Will contact advanced home care to see if we can get her a rolling walker with a seat to prevent future falls.  I also recommend she drink adequate fluids.  We provided her with several supplements including Ensure electrolyte packets and Ensure protein shakes.  We will also have her evaluated by Sarah Horne next week in clinic for her recommendations and how patient can safely live at home alone.   Lab work overall is stable. UA negative. Vital signs after hydration stable. She is comfortable going home alone.  She does not request any additional support from the cancer center.  She knows to call with  any worsening symptoms.  We spoke at length about being on anticoagulation and frequent falls.  Unfortunately we are unable to take her off Eliquis at this time given recent DVT and extent of thrombus.  Disposition: Referral placed for patient to be seen by Sarah Horne next Wednesday for OT evaluation. [facility] to contact patient to supply rolling walker with seat to prevent additional falls. RTC as scheduled on 12/15/2019 to see Dr. Yevette Edwards to initiate gemcitabine.   OT SCREEN ON 12/13/2019:   Pt arrive very unsteady on legs coming in - she is usually per pt in w/c - she repots HH PT did start and she had about 4 sessions this far. Discuss with pt her falls , safety and worries for at home :  - Her legs gives away while standing - at home and it happened at [employer] too - recommend using her rolling walker with seat - in the house and her brother can put it in car and take it out at [employer] - and go in at zero  entrance area not curve. She feel unsteady doing stair or sidewalk Pt to ask PT to work her on 3 stairs with railing at home and using her rolling walker   -Bathroom - has raised toilet - and shower inside tub - she has rail and chair inside - but not mat - will get one - recommended  And HH PT or OT to assess her shower transfer for any recommendations    -Also as HH PT to teach her floor recovery - how to get off the floor when falling   -Loose carpets to be picked up or tape down -and stairs out side tape with contrast tape at edge - she has neuropathy but also cataract - and per pt cannot have surgery while doing chemo - glasses do not help   -wear her shoes with neuropathy  -pt has night light at bathroom  -pt to ask brother to move her things at home to between her knees and shoulders - things she uses the most  -hand out provided on home safety and decrease risk for falling - review , assess and list provided that she can discuss with [facility] therapy    HH PT is  addressing LE strengthening and balance issues   Thanks for referral                                            Visit Diagnosis: Weakness of both legs    Problem List Patient Active Problem List   Diagnosis Date Noted  . Chemotherapy-induced fatigue 11/02/2019  . Convalescence following chemotherapy 11/02/2019  . Leukopenia due to antineoplastic chemotherapy ([place]) 11/02/2019  . Elevated serum creatinine 11/02/2019  . Acute deep vein thrombosis (DVT) of femoral vein of right lower extremity ([place]) 11/01/2019  . Accidental fall from chair, initial encounter 10/05/2019  . Metastatic breast cancer ([place]) 10/05/2019  . Sepsis ([place]) 10/05/2019  . Tachyarrhythmia 10/05/2019  . Brain metastasis ([place]) 05/19/2017  . Blood in stool   . Polyp of sigmoid colon   . Benign neoplasm of transverse colon   . Benign neoplasm of ascending colon   . Lower GI bleed   . Weakness of right lower extremity 02/11/2017  . Orthostatic lightheadedness 01/14/2017  . Atrial tachycardia ([place]) 01/14/2017  . Hypokalemia 01/14/2017  . Pulmonary emboli ([place]) 01/12/2017  . Dehydration 12/25/2016  . Blood loss anemia 12/15/2016  . Ulceration of colon   . Heartburn   . Duodenitis   . Gastritis without bleeding   . GI bleed   . Acute esophagitis   . Acute esophagogastric ulcer   . Rectal bleed   . Hypovolemic shock ([place])   . Ischemic leg 12/04/2016  . Chemotherapy induced neutropenia ([place]) 11/20/2016  . Palliative care by specialist   . Goals of care, counseling/discussion   . DNR (do not resuscitate) discussion   . Cerebral edema ([place]) 11/10/2016  . Encounter for monitoring cardiotoxic drug therapy 11/09/2016  . Elevated LFTs 10/20/2016  . Peripheral neuropathy due to chemotherapy ([place]) 03/13/2016  . Encounter for antineoplastic chemotherapy 03/13/2016  . HLD (hyperlipidemia) 01/29/2016  . Hyperglycemia, unspecified 01/29/2016  . Cardiomyopathy ([place]) 01/13/2016  .  Hypertension 07/18/2015  . Carcinoma of upper-outer quadrant of left breast in female, estrogen receptor positive ([place]) 12/27/2014    Rosalyn Gess OTR/L,CLT  12/13/2019, 12:55 PM  [facility]  [address], [place], [place], [address] Phone: [phone]   Fax:  [phone]  Name: Sarah Horne MRN: [id number] Date of Birth: [birth year]

## 2019-12-14 ENCOUNTER — Other Ambulatory Visit: Payer: Self-pay

## 2019-12-15 ENCOUNTER — Inpatient Hospital Stay: Payer: Medicare HMO

## 2019-12-15 ENCOUNTER — Inpatient Hospital Stay (HOSPITAL_BASED_OUTPATIENT_CLINIC_OR_DEPARTMENT_OTHER): Payer: Medicare HMO | Admitting: Internal Medicine

## 2019-12-15 ENCOUNTER — Inpatient Hospital Stay: Payer: Medicare HMO | Admitting: Hospice and Palliative Medicine

## 2019-12-15 ENCOUNTER — Telehealth: Payer: Self-pay | Admitting: Family Medicine

## 2019-12-15 ENCOUNTER — Ambulatory Visit
Admission: RE | Admit: 2019-12-15 | Discharge: 2019-12-15 | Disposition: A | Discharge status: Home / Self Care | Payer: Medicare HMO | Source: Ambulatory Visit | Attending: Internal Medicine | Admitting: Internal Medicine

## 2019-12-15 VITALS — BP 136/84 | HR 118 | Temp 98.1°F | Wt 140.0 lb

## 2019-12-15 DIAGNOSIS — C50412 Malignant neoplasm of upper-outer quadrant of left female breast: Secondary | ICD-10-CM | POA: Insufficient documentation

## 2019-12-15 DIAGNOSIS — C7931 Secondary malignant neoplasm of brain: Secondary | ICD-10-CM | POA: Diagnosis not present

## 2019-12-15 DIAGNOSIS — T451X5D Adverse effect of antineoplastic and immunosuppressive drugs, subsequent encounter: Secondary | ICD-10-CM | POA: Diagnosis not present

## 2019-12-15 DIAGNOSIS — Z170 Estrogen receptor positive status [ER+]: Secondary | ICD-10-CM | POA: Insufficient documentation

## 2019-12-15 DIAGNOSIS — C787 Secondary malignant neoplasm of liver and intrahepatic bile duct: Secondary | ICD-10-CM | POA: Diagnosis not present

## 2019-12-15 DIAGNOSIS — Z515 Encounter for palliative care: Secondary | ICD-10-CM | POA: Diagnosis not present

## 2019-12-15 DIAGNOSIS — C50919 Malignant neoplasm of unspecified site of unspecified female breast: Secondary | ICD-10-CM | POA: Diagnosis not present

## 2019-12-15 DIAGNOSIS — D702 Other drug-induced agranulocytosis: Secondary | ICD-10-CM | POA: Diagnosis not present

## 2019-12-15 DIAGNOSIS — R29898 Other symptoms and signs involving the musculoskeletal system: Secondary | ICD-10-CM

## 2019-12-15 DIAGNOSIS — C182 Malignant neoplasm of ascending colon: Secondary | ICD-10-CM | POA: Diagnosis not present

## 2019-12-15 DIAGNOSIS — I82401 Acute embolism and thrombosis of unspecified deep veins of right lower extremity: Secondary | ICD-10-CM | POA: Diagnosis not present

## 2019-12-15 DIAGNOSIS — H5500 Unspecified nystagmus: Secondary | ICD-10-CM

## 2019-12-15 DIAGNOSIS — C7800 Secondary malignant neoplasm of unspecified lung: Secondary | ICD-10-CM | POA: Diagnosis not present

## 2019-12-15 DIAGNOSIS — R7989 Other specified abnormal findings of blood chemistry: Secondary | ICD-10-CM | POA: Diagnosis not present

## 2019-12-15 DIAGNOSIS — C7949 Secondary malignant neoplasm of other parts of nervous system: Secondary | ICD-10-CM | POA: Diagnosis not present

## 2019-12-15 DIAGNOSIS — I10 Essential (primary) hypertension: Secondary | ICD-10-CM | POA: Diagnosis not present

## 2019-12-15 DIAGNOSIS — G620 Drug-induced polyneuropathy: Secondary | ICD-10-CM | POA: Diagnosis not present

## 2019-12-15 LAB — COMPREHENSIVE METABOLIC PANEL
ALT: 57 U/L — ABNORMAL HIGH (ref 0–44)
AST: 41 U/L (ref 15–41)
Albumin: 3.6 g/dL (ref 3.5–5.0)
Alkaline Phosphatase: 208 U/L — ABNORMAL HIGH (ref 38–126)
Anion gap: 9 (ref 5–15)
BUN: 13 mg/dL (ref 8–23)
CO2: 24 mmol/L (ref 22–32)
Calcium: 8.6 mg/dL — ABNORMAL LOW (ref 8.9–10.3)
Chloride: 103 mmol/L (ref 98–111)
Creatinine, Ser: 0.77 mg/dL (ref 0.44–1.00)
GFR calc Af Amer: >60 mL/min (ref >60)
GFR calc non Af Amer: >60 mL/min (ref >60)
Glucose, Bld: 83 mg/dL (ref 70–99)
Potassium: 3.4 mmol/L — ABNORMAL LOW (ref 3.5–5.1)
Sodium: 136 mmol/L (ref 135–145)
Total Bilirubin: 0.8 mg/dL (ref 0.3–1.2)
Total Protein: 6.7 g/dL (ref 6.5–8.1)

## 2019-12-15 LAB — CBC WITH DIFFERENTIAL/PLATELET
Abs Immature Granulocytes: 0.03 10*3/uL (ref 0.00–0.07)
Basophils Absolute: 0 10*3/uL (ref 0.0–0.1)
Basophils Relative: 0 %
Eosinophils Absolute: 0 10*3/uL (ref 0.0–0.5)
Eosinophils Relative: 0 %
HCT: 37.5 % (ref 36.0–46.0)
Hemoglobin: 12.1 g/dL (ref 12.0–15.0)
Immature Granulocytes: 1 %
Lymphocytes Relative: 14 %
Lymphs Abs: 0.7 10*3/uL (ref 0.7–4.0)
MCH: 31.8 pg (ref 26.0–34.0)
MCHC: 32.3 g/dL (ref 30.0–36.0)
MCV: 98.7 fL (ref 80.0–100.0)
Monocytes Absolute: 0.9 10*3/uL (ref 0.1–1.0)
Monocytes Relative: 17 %
Neutro Abs: 3.5 10*3/uL (ref 1.7–7.7)
Neutrophils Relative %: 68 %
Platelets: 220 10*3/uL (ref 150–400)
RBC: 3.8 MIL/uL — ABNORMAL LOW (ref 3.87–5.11)
RDW: 20.5 % — ABNORMAL HIGH (ref 11.5–15.5)
WBC: 5.1 10*3/uL (ref 4.0–10.5)
nRBC: 0 % (ref 0.0–0.2)

## 2019-12-15 MED ORDER — SODIUM CHLORIDE 0.9% FLUSH
10.0000 mL | INTRAVENOUS | Status: DC | PRN
  Administered 2019-12-15: 10 mL via INTRAVENOUS
  Filled 2019-12-15: qty 10

## 2019-12-15 MED ORDER — HEPARIN SOD (PORK) LOCK FLUSH 100 UNIT/ML IV SOLN
500.0000 [IU] | Freq: Once | INTRAVENOUS | Status: AC
  Administered 2019-12-15: 500 [IU] via INTRAVENOUS
  Filled 2019-12-15: qty 5

## 2019-12-15 MED ORDER — GADOBUTROL 1 MMOL/ML IV SOLN
6.0000 mL | Freq: Once | INTRAVENOUS | Status: AC | PRN
  Administered 2019-12-15: 6 mL via INTRAVENOUS

## 2019-12-15 NOTE — Telephone Encounter (Signed)
Please call back [facility] and give VO for social worker eval

## 2019-12-15 NOTE — Progress Notes (Signed)
[place] OFFICE PROGRESS NOTE  Patient Care Team: Lada, Satira Anis, MD as PCP - General (Family Medicine) Wellington Hampshire, MD as PCP - Cardiology (Cardiology) Byrnett, Forest Gleason, MD (General Surgery) [name], MD (Inactive) as Attending Physician (Internal Medicine)  Cancer Staging No matching staging information was found for the patient.   Oncology History Overview Note  # LEFT BREAST IDC; STAGE II [cT2N1] ER >90%; PR- 50-90%; her 2 NEU- POS; s/p Neoadj chemo; AUG 2016- TCH+P s/p Lumpec & partial ALND- path CR; s/p RT [finished Nov 2016];  adj Herceptin; HELD for Jan 19th 2017 [in DEC 2016-EF dropped from 63 to 51%; FEB 27th EF-42.9%]; JAN 2016 START Arimidex; May 2017- EF- 60%; May 24th 2017-Re-start Herceptin q 3W; July 28th STOP herceptin [finished 52m/m2; sec to Low EF; however 2017 Oct EF= 67%; improved]  # DEC 4th 2017- Start Neratinib 4 pills; DEC 11th 3 pills; STOPPED.  # FEB 2018- RECURRENCE ER/PR positive; Her 2 NEGATIVE [liver Bx]  # MARCH 1st 2018- Tax-Cytoxan [poor tol]  # FEB 2018- Brain mets [SBRT; Dr.Crystal; finished Feb 28th 2018]  # March 2018- Eribulin- poor tolerance  # June 8th 2018- Started Faslodex + Abema [abema-multiple interruptions sec to neutropenia]  # MRI Brain- March 2019- Leptomeningeal mets [WBRT; No LP s/p WBRT- 01/09/2018]; STOP Abema [530mday-sec to severe cytopenia]+faslodex  # MAY 20tth 019- Start Xeloda; STOPPED in Nov 22nd 2019- sec to Progressive liver lesions on CT scan  # NOV 26th 2019- Faslodex + Piqaray 25035m; Jan 2020- Piqray 300 mg+ faslodex; FEB 17th CT scan- mixed response/ Overall progression; STOP Piqray + faslodex.   # FEB 25th 2020- ERIBULIN; Poor tolerance sec to severe neutropenia.  Stopped May 2020.  CT scan May 2020-new/progressive 2 cm liver lesion;   #July nd 2020-Ixempra x5 cycles-; NOV 2020-MRI liver- worsening liver lesions [rising AST/ALT].   # NOV [id number] weekly [consent-];  December 31-2020-progression/rising LFTs.  Stop Taxol  #January 15th, 2021-Adriamycin weekly [C]; MARCH 2021- Partial response. STOP ADRIAMYCIN sec to EF drop 40-45%.   #March 2021-gemcitabine chemotherapy  #May 06, 2019-MRI brain 5 new lesions [3 mm to 10 mm]-asymptomatic; [previous whole brain radiation]- OCT 2020- Re-Irradiation.   # PN- G-2; may 2017- Cymbalta 47m15mMARCH 2018-  acute vascular insuff of BIL LE [? Taxotere vasoconstriction on asprin]; # hemorragic shock LGIB [s/p colo; Dr.Wohl]  #  SVT- on flecainide.   # April 2016- Liver Bx- NEG;   # Drop in EF from Herceptin [recovered OCt 27th- EF 65%]; March 2020 ejection fraction 40 to 45%.  Stop Adriamycin.  # BMD- jan 2017- osteopenia  # 2020- SEP-s/p Pallaitive care evaluation  -----------------------------------------------------------     MOLECrisp- Sep 4th 2018- PDL-1 0%/NEG for BRCA; MSI-Stable; Positive for PI3K; ccnd-1; FGF** -----------------------------------------------------------  Dx: Metastatic Breast cancer [ER/PR-Pos; her -2 NEG] Stage IV; goals: palliative Current treatment-gemcitabine chemotherapy [C]   Carcinoma of upper-outer quadrant of left breast in female, estrogen receptor positive (HCC)[place]/25/2020 - 01/17/2019 Chemotherapy   The patient had eriBULin mesylate (HALAVEN) 2.1 mg in sodium chloride 0.9 % 100 mL chemo infusion, 1.2 mg/m2 = 2.1 mg (100 % of original dose 1.2 mg/m2), Intravenous,  Once, 3 of 4 cycles Dose modification: 1.2 mg/m2 (original dose 1.2 mg/m2, Cycle 1, Reason: Provider Judgment), 0.8 mg/m2 (original dose 1.2 mg/m2, Cycle 3, Reason: Provider Judgment) Administration: 2.1 mg (11/22/2018), 2.1 mg (11/29/2018), 2.1 mg (12/20/2018), 1.4 mg (01/17/2019)  for chemotherapy treatment.  03/30/2019 - 07/12/2019 Chemotherapy   The patient had pegfilgrastim (NEULASTA ONPRO KIT) injection 6 mg, 6 mg, Subcutaneous, Once, 5 of 8 cycles Administration: 6 mg (03/30/2019),  6 mg (04/20/2019), 6 mg (05/11/2019), 6 mg (06/22/2019), 6 mg (06/01/2019) ixabepilone (IXEMPRA) 60 mg in lactated ringers 250 mL chemo infusion, 33.5 mg/m2 = 57 mg (100 % of original dose 32 mg/m2), Intravenous,  Once, 5 of 8 cycles Dose modification: 32 mg/m2 (original dose 32 mg/m2, Cycle 1, Reason: Provider Judgment) Administration: 72 mg (04/20/2019), 72 mg (05/11/2019), 72 mg (06/22/2019), 72 mg (06/01/2019)  for chemotherapy treatment.    08/18/2019 - 09/28/2019 Chemotherapy   The patient had PACLitaxel (TAXOL) 150 mg in sodium chloride 0.9 % 250 mL chemo infusion (</= 25m/m2), 80 mg/m2 = 150 mg, Intravenous,  Once, 1 of 4 cycles Administration: 150 mg (08/18/2019), 150 mg (09/08/2019), 150 mg (09/15/2019)  for chemotherapy treatment.    10/13/2019 - 11/30/2019 Chemotherapy   The patient had DOXOrubicin (ADRIAMYCIN) chemo injection 38 mg, 20 mg/m2 = 38 mg, Intravenous,  Once, 6 of 9 cycles Administration: 38 mg (10/13/2019), 38 mg (10/20/2019), 38 mg (10/27/2019), 38 mg (11/17/2019), 38 mg (11/24/2019), 38 mg (11/10/2019) palonosetron (ALOXI) injection 0.25 mg, 0.25 mg, Intravenous,  Once, 6 of 9 cycles Administration: 0.25 mg (10/13/2019), 0.25 mg (10/20/2019), 0.25 mg (10/27/2019), 0.25 mg (11/17/2019), 0.25 mg (11/24/2019), 0.25 mg (11/10/2019)  for chemotherapy treatment.    12/15/2019 -  Chemotherapy   The patient had gemcitabine (GEMZAR) 1,444 mg in sodium chloride 0.9 % 250 mL chemo infusion, 800 mg/m2, Intravenous,  Once, 0 of 4 cycles  for chemotherapy treatment.      INTERVAL HISTORY:  Sarah THEALL89y.o.  female pleasant patient above history of metastatic breast cancer ER PR positive/leptomeningeal disease metastatic to bCharcorecently on Adriamycin is here for follow-up.  Given the worsening 2D echo; Adriamycin discontinued.  Patient is here to start gemcitabine.  In the interim patient complains of falls; feels the legs just get weak.  Also complains of dizzy spells.  Also complains  of visual disturbance where she feels the TV screen "always moving to the right".  No headaches.  No nausea.  Appetite is good.   Review of Systems  Constitutional: Positive for malaise/fatigue. Negative for chills, diaphoresis, fever and weight loss.  HENT: Negative for nosebleeds and sore throat.   Eyes: Negative for double vision.  Respiratory: Negative for cough, hemoptysis, sputum production, shortness of breath and wheezing.   Cardiovascular: Negative for chest pain, palpitations, orthopnea and leg swelling.  Gastrointestinal: Negative for abdominal pain, blood in stool, constipation, heartburn, melena, nausea and vomiting.  Genitourinary: Negative for dysuria, frequency and urgency.  Musculoskeletal: Positive for joint pain. Negative for back pain.  Skin: Negative for itching.  Neurological: Positive for dizziness and tingling. Negative for focal weakness, weakness and headaches.  Endo/Heme/Allergies: Does not bruise/bleed easily.  Psychiatric/Behavioral: Negative for depression. The patient is not nervous/anxious and does not have insomnia.     PAST MEDICAL HISTORY :  Past Medical History:  Diagnosis Date  . Chemotherapy-induced peripheral neuropathy ([place]   . Epistaxis    a. 11/2016 in setting of asa/plavix-->silver nitrate cauterization.  . GI bleed    a. 11/2016 Admission w/ GIB and hypovolemic shock req 3u PRBC's;  b. 11/2016 ECG: gastritis & nonbleeding peptic ulcer; c. 11/2016 Conlonoscopy: rectal and sigmoid colonic ulcers.  . Heart attack ([place]    a. 1998 Cath @ [facility]: reportedly no intervention required.  .Marland KitchenHerceptin-induced  cardiomyopathy ([place])    a. In the setting of Herceptin Rx for breast cancer (initiated 12/2014); b. 03/2015 MUGA EF 64%; b. 08/2015 MUGA: EF 51%; c. 10/2015 MUGA: EF 44%; d. 11/2015 Echo: EF 45-50%; e. 01/2016 MUGA: EF 60%; f. 06/2016 MUGA EF 65%; g. 10/2016 MUGA: EF 61%;  h. 12/2016 Echo: EF 55-60%, gr1 DD.  Marland Kitchen Hyperlipidemia   . Hypertension   . Neuropathy    . Possible PAD (peripheral artery disease) ([place])    a. 11/2016 LE cyanosis and weak pulses-->CTA w/o significant Ao-BiFem dzs. ? distal dzs-->ASA/Plavix initiated by vascular surgery.  Marland Kitchen PSVT (paroxysmal supraventricular tachycardia) ([place])    a. Dx 11/2016.  . Pulmonary embolism ([place])    a. 12/2016 CTA Chest: small nonocclusive PE in inferior segment of the Left lingula, somewhat eccentric filling defect suggesting chronic rather than acute embolic event; b. 03/5101 LE U/S:  No DVT; c. 12/2016 Echo: Nl RV fxn, nl PASP.  Marland Kitchen Recurrent Metastatic breast cancer ([place])    a. Dx 2016: Stage II, ER positive, PR positive, HER-2/neu overexpressing of the left breast-->chemo/radiation; b. 10/2016 CT Abd/pelvis: diffuse liver mets, ill defined sclerotic bone lesions-T12;  c. 10/2016 MRI brain: metastatic lesion along L temporal lobe (19x38m) w/ extensive surrounding edema & 566mmidline shift to right.  . Sepsis ([place]  . Sinus tachycardia     PAST SURGICAL HISTORY :   Past Surgical History:  Procedure Laterality Date  . BREAST BIOPSY Left 2016   Positive  . BREAST LUMPECTOMY WITH SENTINEL LYMPH NODE BIOPSY Left 05/23/2015   Procedure: LEFT BREAST WIDE EXCISION WITH AXILLARY DISSECTION, MASTOPLASTY ;  Surgeon: JeRobert BellowMD;  Location: [facility] ORS;  Service: General;  Laterality: Left;  . BREAST SURGERY Left 12/18/14   breast biopsy/INVASIVE DUCTAL CARCINOMA OF BREAST, NOTTINGHAM GRADE 2.  . Marland KitchenREAST SURGERY  05/23/2015.   Wide excision/mastoplasty, axillary dissection. No residual invasive cancer, positive for residual DCIS. 0/2 nodes identified on axillary dissection. (no SLN by technetium or methylene blue)  . CARDIAC CATHETERIZATION    . COLONOSCOPY WITH PROPOFOL N/A 12/10/2016   Procedure: COLONOSCOPY WITH PROPOFOL;  Surgeon: DaLucilla LameMD;  Location: [facility] ENDOSCOPY;  Service: Endoscopy;  Laterality: N/A;  . COLONOSCOPY WITH PROPOFOL N/A 02/13/2017   Procedure: COLONOSCOPY WITH PROPOFOL;  Surgeon:  WoLucilla LameMD;  [facility];  Service: Endoscopy;  Laterality: N/A;  . ESOPHAGOGASTRODUODENOSCOPY N/A 11/03/2019   Procedure: ESOPHAGOGASTRODUODENOSCOPY (EGD);  Surgeon: VaLin LandsmanMD;  [facility];  Service: Gastroenterology;  Laterality: N/A;  . ESOPHAGOGASTRODUODENOSCOPY (EGD) WITH PROPOFOL N/A 12/08/2016   Procedure: ESOPHAGOGASTRODUODENOSCOPY (EGD) WITH PROPOFOL;  Surgeon: DaLucilla LameMD;  Location: [facility] ENDOSCOPY;  Service: Endoscopy;  Laterality: N/A;  . IVC FILTER INSERTION N/A 02/15/2017   Procedure: IVC Filter Insertion;  Surgeon: DeAlgernon HuxleyMD;  [facility];  Service: Cardiovascular;  Laterality: N/A;  . PORTACATH PLACEMENT Right 12-31-14   Dr ByBary Castilla  FAMILY HISTORY :   Family History  Problem Relation Age of Onset  . Breast cancer Maternal Aunt   . Breast cancer Cousin   . Brain cancer Maternal Uncle   . Diabetes Mother   . Hypertension Mother   . Stroke Mother     SOCIAL HISTORY:   Social History   Tobacco Use  . Smoking status: Light Tobacco Smoker    Packs/day: 0.50    Years: 18.00    Pack years: 9.00    Types: Cigarettes  .  Smokeless tobacco: Never Used  Substance Use Topics  . Alcohol use: No    Alcohol/week: 0.0 standard drinks  . Drug use: No    ALLERGIES:  is allergic to no known allergies.  MEDICATIONS:  Current Outpatient Medications  Medication Sig Dispense Refill  . acetaminophen (TYLENOL) 650 MG CR tablet Take 650 mg by mouth every 8 (eight) hours as needed for pain.    Marland Kitchen apixaban (ELIQUIS) 5 MG TABS tablet Take 1 tablet (5 mg total) by mouth 2 (two) times daily. 60 tablet   . flecainide (TAMBOCOR) 50 MG tablet Take 1 tablet (50 mg total) by mouth 2 (two) times daily. 60 tablet 4  . ipratropium (ATROVENT) 0.03 % nasal spray Place 2 sprays into both nostrils every 12 (twelve) hours. 30 mL 12  . levocetirizine (XYZAL) 5 MG tablet Take 1 tablet (5 mg total) by mouth every evening. 30 tablet  2  . magnesium oxide (MAG-OX) 400 MG tablet Take 1 tablet (400 mg total) by mouth 2 (two) times daily. 60 tablet 5  . meclizine (ANTIVERT) 25 MG tablet Take 1 tablet (25 mg total) by mouth 3 (three) times daily as needed for dizziness. 30 tablet 0  . metoprolol tartrate (LOPRESSOR) 25 MG tablet Take 1 tablet (25 mg total) by mouth 2 (two) times daily. 60 tablet 1  . montelukast (SINGULAIR) 10 MG tablet Take 1 tablet (10 mg total) by mouth at bedtime. 60 tablet 3  . pregabalin (LYRICA) 100 MG capsule Take 1 capsule (100 mg total) by mouth 2 (two) times daily. 60 capsule 2  . Vitamin D, Ergocalciferol, (DRISDOL) 1.25 MG (50000 UNIT) CAPS capsule TAKE 1 CAPSULE (50,000 UNITS TOTAL) ONCE A WEEK BY MOUTH. 12 capsule 1   No current facility-administered medications for this visit.   Facility-Administered Medications Ordered in Other Visits  Medication Dose Route Frequency Provider Last Rate Last Admin  . 0.9 %  sodium chloride infusion   Intravenous Continuous [name], MD   New Bag at 12/10/16 1140  . 0.9 %  sodium chloride infusion   Intravenous Continuous Lloyd Huger, MD 999 mL/hr at 04/24/15 1520 New Bag at 05/23/15 0951  . 0.9 %  sodium chloride infusion   Intravenous Continuous Verlon Au, NP 999 mL/hr at 10/31/19 1522 New Bag at 10/31/19 1522  . gadobutrol (GADAVIST) 1 MMOL/ML injection 6 mL  6 mL Intravenous Once PRN Charlaine Dalton R, MD      . heparin lock flush 100 unit/mL  500 Units Intravenous Once Berenzon, Dmitriy, MD      . sodium chloride 0.9 % injection 10 mL  10 mL Intravenous PRN [name], MD   10 mL at 04/04/15 1440  . sodium chloride flush (NS) 0.9 % injection 10 mL  10 mL Intravenous PRN Berenzon, Dmitriy, MD        PHYSICAL EXAMINATION: ECOG PERFORMANCE STATUS: 1 - Symptomatic but completely ambulatory  BP 136/84 (BP Location: Left Arm, Patient Position: Sitting, Cuff Size: Normal)   Pulse (!) 118   Temp 98.1 F (36.7 C) (Tympanic)   Wt 140  lb (63.5 kg)   BMI 22.60 kg/m   Filed Weights   12/14/19 1357  Weight: 140 lb (63.5 kg)    Physical Exam  Constitutional: She is oriented to person, place, and time and well-developed, well-nourished, and in no distress.  Patient is alone.  In wheel chair.   HENT:  Head: Normocephalic and atraumatic.  Mouth/Throat: Oropharynx is clear and moist. No  oropharyngeal exudate.  Eyes: Pupils are equal, round, and reactive to light.  Nystagmus bilateral.   Cardiovascular: Normal rate and regular rhythm.  Pulmonary/Chest: Effort normal and breath sounds normal. No respiratory distress. She has no wheezes.  Abdominal: Soft. Bowel sounds are normal. She exhibits no distension and no mass. There is no abdominal tenderness. There is no rebound and no guarding.  Musculoskeletal:        General: No tenderness. Normal range of motion.     Cervical back: Normal range of motion and neck supple.  Neurological: She is alert and oriented to person, place, and time.  Skin: Skin is warm.  Psychiatric: Affect normal.    LABORATORY DATA:  I have reviewed the data as listed    Component Value Date/Time   NA 136 12/15/2019 0844   NA 135 03/04/2017 0918   NA 135 01/08/2015 0850   K 3.4 (L) 12/15/2019 0844   K 3.7 01/08/2015 0850   CL 103 12/15/2019 0844   CL 101 01/08/2015 0850   CO2 24 12/15/2019 0844   CO2 26 01/08/2015 0850   GLUCOSE 83 12/15/2019 0844   GLUCOSE 161 (H) 01/08/2015 0850   BUN 13 12/15/2019 0844   BUN 8 03/04/2017 0918   BUN 17 01/08/2015 0850   CREATININE 0.77 12/15/2019 0844   CREATININE 0.82 01/22/2015 1559   CALCIUM 8.6 (L) 12/15/2019 0844   CALCIUM 9.3 01/08/2015 0850   PROT 6.7 12/15/2019 0844   PROT 6.4 03/04/2017 0918   PROT 6.8 01/22/2015 1559   ALBUMIN 3.6 12/15/2019 0844   ALBUMIN 2.5 (L) 03/04/2017 0918   ALBUMIN 3.9 01/22/2015 1559   AST 41 12/15/2019 0844   AST 34 01/22/2015 1559   ALT 57 (H) 12/15/2019 0844   ALT 42 01/22/2015 1559   ALKPHOS 208 (H)  12/15/2019 0844   ALKPHOS 157 (H) 01/22/2015 1559   BILITOT 0.8 12/15/2019 0844   BILITOT 6.2 (H) 03/04/2017 0918   BILITOT 0.2 (L) 01/22/2015 1559   GFRNONAA >60 12/15/2019 0844   GFRNONAA >60 01/22/2015 1559   GFRAA >60 12/15/2019 0844   GFRAA >60 01/22/2015 1559    No results found for: SPEP, UPEP  Lab Results  Component Value Date   WBC 5.1 12/15/2019   NEUTROABS 3.5 12/15/2019   HGB 12.1 12/15/2019   HCT 37.5 12/15/2019   MCV 98.7 12/15/2019   PLT 220 12/15/2019      Chemistry      Component Value Date/Time   NA 136 12/15/2019 0844   NA 135 03/04/2017 0918   NA 135 01/08/2015 0850   K 3.4 (L) 12/15/2019 0844   K 3.7 01/08/2015 0850   CL 103 12/15/2019 0844   CL 101 01/08/2015 0850   CO2 24 12/15/2019 0844   CO2 26 01/08/2015 0850   BUN 13 12/15/2019 0844   BUN 8 03/04/2017 0918   BUN 17 01/08/2015 0850   CREATININE 0.77 12/15/2019 0844   CREATININE 0.82 01/22/2015 1559      Component Value Date/Time   CALCIUM 8.6 (L) 12/15/2019 0844   CALCIUM 9.3 01/08/2015 0850   ALKPHOS 208 (H) 12/15/2019 0844   ALKPHOS 157 (H) 01/22/2015 1559   AST 41 12/15/2019 0844   AST 34 01/22/2015 1559   ALT 57 (H) 12/15/2019 0844   ALT 42 01/22/2015 1559   BILITOT 0.8 12/15/2019 0844   BILITOT 6.2 (H) 03/04/2017 0918   BILITOT 0.2 (L) 01/22/2015 1559       RADIOGRAPHIC STUDIES: I have  personally reviewed the radiological images as listed and agreed with the findings in the report. No results found.   ASSESSMENT & PLAN:  Carcinoma of upper-outer quadrant of left breast in female, estrogen receptor positive ([place]) # Recurrent metastatic breast cancer-lung/liver metastases ER PR positive HER-2 negative; CT scan March second 2021-partial response noted-improved liver lesions; lung lesion.  No new lesions.  Discontinue Adriamycin because of drop in ejection fraction.  Plan to start gemcitabine.  #Hold gemcitabine today because of patient's ongoing dizziness/falls neurologic  complaints [see below].  #Dizzy spells/nystagmus/visual disturbance-very concerning for symptomatic brain metastases.  Recommend stat MRI brain.  # cardiomyopathy/ Sinus tachycardia- on metoprolol-/Tamabcor; [march 2d-echo- 40-45%]; clinically stable.  # Elevated LFTs-likely secondary to progressive malignancy;STABLE.  # Brain metastases; Recurrent-s/p RT finshed 11/17.  MRI- improved; clinically Stable.   # Peripheral neuropathy grade 1-2-  on Lyrica; stable  # Right lower extremity DVT bilateral-November 28, 2019--continue Eliquis; discussed my concerns for fall on anticoagulation.  #Josh also met with patient today.  I will try to reach out to patient's brother today.  # DISPOSITION:  # HOLD chemo today; de-access # MRI STAT # referral to [facility]- neurology- ASAP re: Nystagmus/hx of brain metastases # 2 week- MD; labs- cbc/cmp;GEMCITABINE [new]- Dr.B     Orders Placed This Encounter  Procedures  . MR Brain W Wo Contrast    Standing Status:   Future    Number of Occurrences:   1    Standing Expiration Date:   12/14/2020    Order Specific Question:   ** REASON FOR EXAM (FREE TEXT)    Answer:   brain mets; new onset nystagmus    Order Specific Question:   If indicated for the ordered procedure, I authorize the administration of contrast media per Radiology protocol    Answer:   Yes    Order Specific Question:   What is the patient's sedation requirement?    Answer:   No Sedation    Order Specific Question:   Does the patient have a pacemaker or implanted devices?    Answer:   No    Order Specific Question:   Use SRS Protocol?    Answer:   No    Order Specific Question:   Call Results- Best Contact Number?    Answer:   [phone] return to [facility] cancer center    Order Specific Question:   Radiology Contrast Protocol - do NOT remove file path    Answer:   [email or web address]    Order Specific Question:   Preferred imaging location?    Answer:    [facility] (table limit-400lbs)  . CBC with Differential    Standing Status:   Future    Standing Expiration Date:   12/14/2020  . Comprehensive metabolic panel    Standing Status:   Future    Standing Expiration Date:   12/14/2020  . Ambulatory referral to Neurology    Referral Priority:   Urgent    Referral Type:   Consultation    Referral Reason:   Specialty Services Required    Referred to Provider:   Vladimir Crofts, MD    Requested Specialty:   Neurology    Number of Visits Requested:   1   All questions were answered. The patient knows to call the clinic with any problems, questions or concerns.     Cammie Sickle, MD 12/15/2019 12:32 PM

## 2019-12-15 NOTE — Assessment & Plan Note (Addendum)
#  Recurrent metastatic breast cancer-lung/liver metastases ER PR positive HER-2 negative; CT scan March second 2021-partial response noted-improved liver lesions; lung lesion.  No new lesions.  Discontinue Adriamycin because of drop in ejection fraction.  Plan to start gemcitabine.  #Hold gemcitabine today because of patient's ongoing dizziness/falls neurologic complaints [see below].  #Dizzy spells/nystagmus/visual disturbance-very concerning for symptomatic brain metastases.  Recommend stat MRI brain.  # cardiomyopathy/ Sinus tachycardia- on metoprolol-/Tamabcor; [march 2d-echo- 40-45%]; clinically stable.  # Elevated LFTs-likely secondary to progressive malignancy;STABLE.  # Brain metastases; Recurrent-s/p RT finshed 11/17.  MRI- improved; clinically Stable.   # Peripheral neuropathy grade 1-2-  on Lyrica; stable  # Right lower extremity DVT bilateral-November 28, 2019--continue Eliquis; discussed my concerns for fall on anticoagulation.  #Josh also met with patient today.  I will try to reach out to patient's brother today.  # DISPOSITION:  # HOLD chemo today; de-access # MRI STAT # referral to [facility]- neurology- ASAP re: Nystagmus/hx of brain metastases # 2 week- MD; labs- cbc/cmp;GEMCITABINE [new]- Dr.B

## 2019-12-15 NOTE — Telephone Encounter (Signed)
Sarah Horne given verbal rder

## 2019-12-15 NOTE — Telephone Encounter (Signed)
Caller name: Colletta [place]  Relation to pt: OT with [facility]  Call back number: [phone]   Reason for call:  OT requesting social worker eval orders, please advise

## 2019-12-15 NOTE — Progress Notes (Addendum)
[facility]  [phone] [phone]   Name: Sarah Horne Date: 12/15/2019 MRN: [id number]  DOB: 12/04/1955  Patient Care Team: Arnetha Courser, MD as PCP - General (Family Medicine) Wellington Hampshire, MD as PCP - Cardiology (Cardiology) Byrnett, Forest Gleason, MD (General Surgery) [name], MD (Inactive) as Attending Physician (Internal Medicine)    REASON FOR CONSULTATION: Sarah Horne is a 64 y.o. female with multiple medical problems including stage IV breast cancer with leptomeningeal and liver metastases who has had disease progression on multiple previous lines of treatment. MRI of the brain on 05/06/2019 showed a several new metastatic lesions. MRI of the brain on 07/06/2019 also showed evidence of disease progression with enlarging existing lesions and note of interval new metastatic lesions. Patient was most recently rotated gemcitabine.  Patient was referred to palliative care to help address goals and manage ongoing symptoms.   SOCIAL HISTORY:     reports that she has been smoking cigarettes. She has a 9.00 pack-year smoking history. She has never used smokeless tobacco. She reports that she does not drink alcohol or use drugs.   Patient is not married. She lives at home alone. She has a son and daughter who live about 30 minutes away. She has a brother who is involved in her care. 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  ADVANCE DIRECTIVES:  HC POA and living will on file dated 12/12/2016. Patient's brother is her healthcare power of attorney.  CODE STATUS: DNR/DNI (MOST form completed 07/13/2019)  PAST MEDICAL HISTORY: Past Medical History:  Diagnosis Date  . Chemotherapy-induced peripheral neuropathy ([place])   . Epistaxis    a. 11/2016 in setting of asa/plavix-->silver nitrate cauterization.  . GI bleed    a. 11/2016 Admission w/ GIB and hypovolemic shock req 3u PRBC's;  b.  11/2016 ECG: gastritis & nonbleeding peptic ulcer; c. 11/2016 Conlonoscopy: rectal and sigmoid colonic ulcers.  . Heart attack ([place])    a. 1998 Cath @ [facility]: reportedly no intervention required.  Marland Kitchen Herceptin-induced cardiomyopathy ([place])    a. In the setting of Herceptin Rx for breast cancer (initiated 12/2014); b. 03/2015 MUGA EF 64%; b. 08/2015 MUGA: EF 51%; c. 10/2015 MUGA: EF 44%; d. 11/2015 Echo: EF 45-50%; e. 01/2016 MUGA: EF 60%; f. 06/2016 MUGA EF 65%; g. 10/2016 MUGA: EF 61%;  h. 12/2016 Echo: EF 55-60%, gr1 DD.  Marland Kitchen Hyperlipidemia   . Hypertension   . Neuropathy   . Possible PAD (peripheral artery disease) ([place])    a. 11/2016 LE cyanosis and weak pulses-->CTA w/o significant Ao-BiFem dzs. ? distal dzs-->ASA/Plavix initiated by vascular surgery.  Marland Kitchen PSVT (paroxysmal supraventricular tachycardia) ([place])    a. Dx 11/2016.  . Pulmonary embolism ([place])    a. 12/2016 CTA Chest: small nonocclusive PE in inferior segment of the Left lingula, somewhat eccentric filling defect suggesting chronic rather than acute embolic event; b. 02/1442 LE U/S:  No DVT; c. 12/2016 Echo: Nl RV fxn, nl PASP.  Marland Kitchen Recurrent Metastatic breast cancer ([place])    a. Dx 2016: Stage II, ER positive, PR positive, HER-2/neu overexpressing of the left breast-->chemo/radiation; b. 10/2016 CT Abd/pelvis: diffuse liver mets, ill defined sclerotic bone lesions-T12;  c. 10/2016 MRI brain: metastatic lesion along L temporal lobe (19x43m) w/ extensive surrounding edema & 561mmidline shift to right.  . Sepsis ([place]  . Sinus tachycardia     PAST SURGICAL HISTORY:  Past Surgical History:  Procedure  Laterality Date  . BREAST BIOPSY Left 2016   Positive  . BREAST LUMPECTOMY WITH SENTINEL LYMPH NODE BIOPSY Left 05/23/2015   Procedure: LEFT BREAST WIDE EXCISION WITH AXILLARY DISSECTION, MASTOPLASTY ;  Surgeon: [name], MD;  Location: [facility] ORS;  Service: General;  Laterality: Left;  . BREAST SURGERY Left 12/18/14   breast biopsy/INVASIVE DUCTAL  CARCINOMA OF BREAST, NOTTINGHAM GRADE 2.  Marland Kitchen BREAST SURGERY  05/23/2015.   Wide excision/mastoplasty, axillary dissection. No residual invasive cancer, positive for residual DCIS. 0/2 nodes identified on axillary dissection. (no SLN by technetium or methylene blue)  . CARDIAC CATHETERIZATION    . COLONOSCOPY WITH PROPOFOL N/A 12/10/2016   Procedure: COLONOSCOPY WITH PROPOFOL;  Surgeon: Lucilla Lame, MD;  Location: [facility] ENDOSCOPY;  Service: Endoscopy;  Laterality: N/A;  . COLONOSCOPY WITH PROPOFOL N/A 02/13/2017   Procedure: COLONOSCOPY WITH PROPOFOL;  Surgeon: Lucilla Lame, MD;  Location: [facility] ENDOSCOPY;  Service: Endoscopy;  Laterality: N/A;  . ESOPHAGOGASTRODUODENOSCOPY N/A 11/03/2019   Procedure: ESOPHAGOGASTRODUODENOSCOPY (EGD);  Surgeon: Lin Landsman, MD;  Location: [facility] ENDOSCOPY;  Service: Gastroenterology;  Laterality: N/A;  . ESOPHAGOGASTRODUODENOSCOPY (EGD) WITH PROPOFOL N/A 12/08/2016   Procedure: ESOPHAGOGASTRODUODENOSCOPY (EGD) WITH PROPOFOL;  Surgeon: Lucilla Lame, MD;  Location: [facility] ENDOSCOPY;  Service: Endoscopy;  Laterality: N/A;  . IVC FILTER INSERTION N/A 02/15/2017   Procedure: IVC Filter Insertion;  Surgeon: Algernon Huxley, MD;  Location: [facility];  Service: Cardiovascular;  Laterality: N/A;  . PORTACATH PLACEMENT Right 12-31-14   Dr Bary Castilla    HEMATOLOGY/ONCOLOGY HISTORY:  Oncology History Overview Note  # LEFT BREAST IDC; STAGE II [cT2N1] ER >90%; PR- 50-90%; her 2 NEU- POS; s/p Neoadj chemo; AUG 2016- TCH+P s/p Lumpec & partial ALND- path CR; s/p RT [finished Nov 2016];  adj Herceptin; HELD for Jan 19th 2017 [in DEC 2016-EF dropped from 63 to 51%; FEB 27th EF-42.9%]; JAN 2016 START Arimidex; May 2017- EF- 60%; May 24th 2017-Re-start Herceptin q 3W; July 28th STOP herceptin [finished 19m/m2; sec to Low EF; however 2017 Oct EF= 67%; improved]  # DEC 4th 2017- Start Neratinib 4 pills; DEC 11th 3 pills; STOPPED.  # FEB 2018- RECURRENCE ER/PR positive; Her 2  NEGATIVE [liver Bx]  # MARCH 1st 2018- Tax-Cytoxan [poor tol]  # FEB 2018- Brain mets [SBRT; Dr.Crystal; finished Feb 28th 2018]  # March 2018- Eribulin- poor tolerance  # June 8th 2018- Started Faslodex + Abema [abema-multiple interruptions sec to neutropenia]  # MRI Brain- March 2019- Leptomeningeal mets [WBRT; No LP s/p WBRT- 01/09/2018]; STOP Abema [551mday-sec to severe cytopenia]+faslodex  # MAY 20tth 019- Start Xeloda; STOPPED in Nov 22nd 2019- sec to Progressive liver lesions on CT scan  # NOV 26th 2019- Faslodex + Piqaray 25079m; Jan 2020- Piqray 300 mg+ faslodex; FEB 17th CT scan- mixed response/ Overall progression; STOP Piqray + faslodex.   # FEB 25th 2020- ERIBULIN; Poor tolerance sec to severe neutropenia.  Stopped May 2020.  CT scan May 2020-new/progressive 2 cm liver lesion;   #July nd 2020-Ixempra x5 cycles-; NOV 2020-MRI liver- worsening liver lesions [rising AST/ALT].   # NOV [id number] weekly [consent-]; December 31-2020-progression/rising LFTs.  Stop Taxol  #January 15th, 2021-Adriamycin weekly [C]; MARCH 2021- Partial response. STOP ADRIAMYCIN sec to EF drop 40-45%.   #March 2021-gemcitabine chemotherapy  #May 06, 2019-MRI brain 5 new lesions [3 mm to 10 mm]-asymptomatic; [previous whole brain radiation]- OCT 2020- Re-Irradiation.   # PN- G-2; may 2017- Cymbalta 54m3mMARCH 2018-  acute vascular insuff of  BIL LE [? Taxotere vasoconstriction on asprin]; # hemorragic shock LGIB [s/p colo; Dr.Wohl]  #  SVT- on flecainide.   # April 2016- Liver Bx- NEG;   # Drop in EF from Herceptin [recovered OCt 27th- EF 65%]; March 2020 ejection fraction 40 to 45%.  Stop Adriamycin.  # BMD- jan 2017- osteopenia  # 2020- SEP-s/p Pallaitive care evaluation  -----------------------------------------------------------     [facility]- Sep 4th 2018- PDL-1 0%/NEG for BRCA; MSI-Stable; Positive for PI3K; ccnd-1;  FGF** -----------------------------------------------------------  Dx: Metastatic Breast cancer [ER/PR-Pos; her -2 NEG] Stage IV; goals: palliative Current treatment-gemcitabine chemotherapy [C]   Carcinoma of upper-outer quadrant of left breast in female, estrogen receptor positive ([place])  11/22/2018 - 01/17/2019 Chemotherapy   The patient had eriBULin mesylate (HALAVEN) 2.1 mg in sodium chloride 0.9 % 100 mL chemo infusion, 1.2 mg/m2 = 2.1 mg (100 % of original dose 1.2 mg/m2), Intravenous,  Once, 3 of 4 cycles Dose modification: 1.2 mg/m2 (original dose 1.2 mg/m2, Cycle 1, Reason: Provider Judgment), 0.8 mg/m2 (original dose 1.2 mg/m2, Cycle 3, Reason: Provider Judgment) Administration: 2.1 mg (11/22/2018), 2.1 mg (11/29/2018), 2.1 mg (12/20/2018), 1.4 mg (01/17/2019)  for chemotherapy treatment.    03/30/2019 - 07/12/2019 Chemotherapy   The patient had pegfilgrastim (NEULASTA ONPRO KIT) injection 6 mg, 6 mg, Subcutaneous, Once, 5 of 8 cycles Administration: 6 mg (03/30/2019), 6 mg (04/20/2019), 6 mg (05/11/2019), 6 mg (06/22/2019), 6 mg (06/01/2019) ixabepilone (IXEMPRA) 60 mg in lactated ringers 250 mL chemo infusion, 33.5 mg/m2 = 57 mg (100 % of original dose 32 mg/m2), Intravenous,  Once, 5 of 8 cycles Dose modification: 32 mg/m2 (original dose 32 mg/m2, Cycle 1, Reason: Provider Judgment) Administration: 72 mg (04/20/2019), 72 mg (05/11/2019), 72 mg (06/22/2019), 72 mg (06/01/2019)  for chemotherapy treatment.    08/18/2019 - 09/28/2019 Chemotherapy   The patient had PACLitaxel (TAXOL) 150 mg in sodium chloride 0.9 % 250 mL chemo infusion (</= 79m/m2), 80 mg/m2 = 150 mg, Intravenous,  Once, 1 of 4 cycles Administration: 150 mg (08/18/2019), 150 mg (09/08/2019), 150 mg (09/15/2019)  for chemotherapy treatment.    10/13/2019 - 11/30/2019 Chemotherapy   The patient had DOXOrubicin (ADRIAMYCIN) chemo injection 38 mg, 20 mg/m2 = 38 mg, Intravenous,  Once, 6 of 9 cycles Administration: 38 mg (10/13/2019), 38  mg (10/20/2019), 38 mg (10/27/2019), 38 mg (11/17/2019), 38 mg (11/24/2019), 38 mg (11/10/2019) palonosetron (ALOXI) injection 0.25 mg, 0.25 mg, Intravenous,  Once, 6 of 9 cycles Administration: 0.25 mg (10/13/2019), 0.25 mg (10/20/2019), 0.25 mg (10/27/2019), 0.25 mg (11/17/2019), 0.25 mg (11/24/2019), 0.25 mg (11/10/2019)  for chemotherapy treatment.    12/15/2019 -  Chemotherapy   The patient had gemcitabine (GEMZAR) 1,444 mg in sodium chloride 0.9 % 250 mL chemo infusion, 800 mg/m2, Intravenous,  Once, 0 of 4 cycles  for chemotherapy treatment.      ALLERGIES:  is allergic to no known allergies.  MEDICATIONS:  Current Outpatient Medications  Medication Sig Dispense Refill  . acetaminophen (TYLENOL) 650 MG CR tablet Take 650 mg by mouth every 8 (eight) hours as needed for pain.    .Marland Kitchenapixaban (ELIQUIS) 5 MG TABS tablet Take 1 tablet (5 mg total) by mouth 2 (two) times daily. 60 tablet   . flecainide (TAMBOCOR) 50 MG tablet Take 1 tablet (50 mg total) by mouth 2 (two) times daily. 60 tablet 4  . ipratropium (ATROVENT) 0.03 % nasal spray Place 2 sprays into both nostrils every 12 (twelve) hours. 30 mL 12  .  levocetirizine (XYZAL) 5 MG tablet Take 1 tablet (5 mg total) by mouth every evening. 30 tablet 2  . magnesium oxide (MAG-OX) 400 MG tablet Take 1 tablet (400 mg total) by mouth 2 (two) times daily. 60 tablet 5  . meclizine (ANTIVERT) 25 MG tablet Take 1 tablet (25 mg total) by mouth 3 (three) times daily as needed for dizziness. 30 tablet 0  . metoprolol tartrate (LOPRESSOR) 25 MG tablet Take 1 tablet (25 mg total) by mouth 2 (two) times daily. 60 tablet 1  . montelukast (SINGULAIR) 10 MG tablet Take 1 tablet (10 mg total) by mouth at bedtime. 60 tablet 3  . pregabalin (LYRICA) 100 MG capsule Take 1 capsule (100 mg total) by mouth 2 (two) times daily. 60 capsule 2  . Vitamin D, Ergocalciferol, (DRISDOL) 1.25 MG (50000 UNIT) CAPS capsule TAKE 1 CAPSULE (50,000 UNITS TOTAL) ONCE A WEEK BY MOUTH. 12  capsule 1   No current facility-administered medications for this visit.   Facility-Administered Medications Ordered in Other Visits  Medication Dose Route Frequency Provider Last Rate Last Admin  . 0.9 %  sodium chloride infusion   Intravenous Continuous [name], MD   New Bag at 12/10/16 1140  . 0.9 %  sodium chloride infusion   Intravenous Continuous Lloyd Huger, MD 999 mL/hr at 04/24/15 1520 New Bag at 05/23/15 0951  . 0.9 %  sodium chloride infusion   Intravenous Continuous Verlon Au, NP 999 mL/hr at 10/31/19 1522 New Bag at 10/31/19 1522  . heparin lock flush 100 unit/mL  500 Units Intravenous Once Berenzon, Dmitriy, MD      . sodium chloride 0.9 % injection 10 mL  10 mL Intravenous PRN [name], MD   10 mL at 04/04/15 1440  . sodium chloride flush (NS) 0.9 % injection 10 mL  10 mL Intravenous PRN Berenzon, Dmitriy, MD        VITAL SIGNS: There were no vitals taken for this visit. There were no vitals filed for this visit.  Estimated body mass index is 22.6 kg/m as calculated from the following:   Height as of 11/10/19: 5' 6"  (1.676 m).   Weight as of 12/14/19: 140 lb (63.5 kg).  LABS: CBC:    Component Value Date/Time   WBC 5.1 12/15/2019 0844   HGB 12.1 12/15/2019 0844   HGB 10.2 (L) 03/04/2017 0918   HCT 37.5 12/15/2019 0844   HCT 33.3 (L) 03/04/2017 0918   PLT 220 12/15/2019 0844   PLT 225 03/04/2017 0918   MCV 98.7 12/15/2019 0844   MCV 89 03/04/2017 0918   MCV 90 01/22/2015 1559   NEUTROABS 3.5 12/15/2019 0844   NEUTROABS 3.1 03/04/2017 0918   NEUTROABS 34.8 (H) 01/22/2015 1559   LYMPHSABS 0.7 12/15/2019 0844   LYMPHSABS 0.7 03/04/2017 0918   LYMPHSABS 3.4 01/22/2015 1559   MONOABS 0.9 12/15/2019 0844   MONOABS 1.0 (H) 01/22/2015 1559   EOSABS 0.0 12/15/2019 0844   EOSABS 0.1 03/04/2017 0918   EOSABS 0.0 01/22/2015 1559   BASOSABS 0.0 12/15/2019 0844   BASOSABS 0.0 03/04/2017 0918   BASOSABS 0.0 01/22/2015 1559   Comprehensive  Metabolic Panel:    Component Value Date/Time   NA 136 12/15/2019 0844   NA 135 03/04/2017 0918   NA 135 01/08/2015 0850   K 3.4 (L) 12/15/2019 0844   K 3.7 01/08/2015 0850   CL 103 12/15/2019 0844   CL 101 01/08/2015 0850   CO2 24 12/15/2019 0844  CO2 26 01/08/2015 0850   BUN 13 12/15/2019 0844   BUN 8 03/04/2017 0918   BUN 17 01/08/2015 0850   CREATININE 0.77 12/15/2019 0844   CREATININE 0.82 01/22/2015 1559   GLUCOSE 83 12/15/2019 0844   GLUCOSE 161 (H) 01/08/2015 0850   CALCIUM 8.6 (L) 12/15/2019 0844   CALCIUM 9.3 01/08/2015 0850   AST 41 12/15/2019 0844   AST 34 01/22/2015 1559   ALT 57 (H) 12/15/2019 0844   ALT 42 01/22/2015 1559   ALKPHOS 208 (H) 12/15/2019 0844   ALKPHOS 157 (H) 01/22/2015 1559   BILITOT 0.8 12/15/2019 0844   BILITOT 6.2 (H) 03/04/2017 0918   BILITOT 0.2 (L) 01/22/2015 1559   PROT 6.7 12/15/2019 0844   PROT 6.4 03/04/2017 0918   PROT 6.8 01/22/2015 1559   ALBUMIN 3.6 12/15/2019 0844   ALBUMIN 2.5 (L) 03/04/2017 0918   ALBUMIN 3.9 01/22/2015 1559    RADIOGRAPHIC STUDIES: CT Chest W Contrast  Result Date: 11/28/2019 CLINICAL DATA:  Restaging metastatic breast cancer. EXAM: CT CHEST, ABDOMEN, AND PELVIS WITH CONTRAST TECHNIQUE: Multidetector CT imaging of the chest, abdomen and pelvis was performed following the standard protocol during bolus administration of intravenous contrast. CONTRAST:  168m OMNIPAQUE IOHEXOL 300 MG/ML  SOLN COMPARISON:  CT angio chest 10/05/2019 MR abdomen 08/15/2020 And PET-CT 06/20/19 FINDINGS: CT CHEST FINDINGS Cardiovascular: The heart size appears mildly enlarged. No pericardial effusion. Mild aortic atherosclerosis. Lad and RCA coronary artery calcifications. Mediastinum/Nodes: Normal appearance of the thyroid gland. The trachea appears patent and is midline. Normal appearance of the esophagus. No enlarged axillary, supraclavicular, mediastinal, or hilar lymph nodes. Lungs/Pleura: Centrilobular emphysema. No pleural  effusion. Cavitary lung mass within the left upper lobe measures 3.4 x 2.9 cm, image 17/4. Previously 4.5 x 3.8 cm. Surrounding interlobular septal thickening is identified which may represent lymphangitic spread of disease. Musculoskeletal: No chest wall mass or suspicious bone lesions identified. CT ABDOMEN PELVIS FINDINGS Hepatobiliary: Pseudo cirrhotic appearance of the liver is of again identified. Index lesion in lateral segment of left lobe of liver measures 1.5 x 1.1 cm, image 57/2. Decreased from 1.9 x 1.7 cm. Index lesion within posterior right hepatic lobe measures 1.4 x 2.3 cm, image 46/2. Previously 2.1 x 2.6 cm. The subcapsular lesion within medial segment of left lobe of liver measures 1.0 x 0.4 cm, image 61/2. Previously 1.8 x 0.9 cm. Gallbladder negative. No biliary ductal dilatation. Pancreas: Unremarkable. No pancreatic ductal dilatation or surrounding inflammatory changes. Spleen: Normal in size without focal abnormality. Adrenals/Urinary Tract: Normal appearance of the adrenal glands. No kidney mass or hydronephrosis. Urinary bladder is unremarkable. Stomach/Bowel: Stomach is within normal limits. No evidence of bowel wall thickening, distention, or inflammatory changes. Vascular/Lymphatic: Aortic atherosclerosis. No aneurysm. IVC filter is in place. There is thrombus identified throughout the IVC, bilateral iliac veins and the visualized portions of the femoral veins. No abdominopelvic adenopathy identified. Reproductive: Fibroid uterus. No adnexal mass. Other: No free fluid or fluid collections. Musculoskeletal: No acute or significant osseous findings. IMPRESSION: 1. Interval decrease in size of cavitary lung mass within the left upper lobe compatible with response to therapy. 2. Interval decrease in size of multi focal liver lesions compatible with response to therapy. 3. Emphysema and aortic atherosclerosis. 4. Multi vessel coronary artery calcifications. 5. The IVC, proximal to the filter,  appears distended and filled with thrombus which extends from bilateral lower extremities through the iliac veins. Aortic Atherosclerosis (ICD10-I70.0) and Emphysema (ICD10-J43.9). These results will be called to the ordering  clinician or representative by the Radiologist Assistant, and communication documented in the PACS or zVision Dashboard. Electronically Signed   By: Kerby Moors M.D.   On: 11/28/2019 14:41   CT ABDOMEN PELVIS W CONTRAST  Result Date: 11/28/2019 CLINICAL DATA:  Restaging metastatic breast cancer. EXAM: CT CHEST, ABDOMEN, AND PELVIS WITH CONTRAST TECHNIQUE: Multidetector CT imaging of the chest, abdomen and pelvis was performed following the standard protocol during bolus administration of intravenous contrast. CONTRAST:  161m OMNIPAQUE IOHEXOL 300 MG/ML  SOLN COMPARISON:  CT angio chest 10/05/2019 MR abdomen 08/15/2020 And PET-CT 06/20/19 FINDINGS: CT CHEST FINDINGS Cardiovascular: The heart size appears mildly enlarged. No pericardial effusion. Mild aortic atherosclerosis. Lad and RCA coronary artery calcifications. Mediastinum/Nodes: Normal appearance of the thyroid gland. The trachea appears patent and is midline. Normal appearance of the esophagus. No enlarged axillary, supraclavicular, mediastinal, or hilar lymph nodes. Lungs/Pleura: Centrilobular emphysema. No pleural effusion. Cavitary lung mass within the left upper lobe measures 3.4 x 2.9 cm, image 17/4. Previously 4.5 x 3.8 cm. Surrounding interlobular septal thickening is identified which may represent lymphangitic spread of disease. Musculoskeletal: No chest wall mass or suspicious bone lesions identified. CT ABDOMEN PELVIS FINDINGS Hepatobiliary: Pseudo cirrhotic appearance of the liver is of again identified. Index lesion in lateral segment of left lobe of liver measures 1.5 x 1.1 cm, image 57/2. Decreased from 1.9 x 1.7 cm. Index lesion within posterior right hepatic lobe measures 1.4 x 2.3 cm, image 46/2. Previously 2.1 x  2.6 cm. The subcapsular lesion within medial segment of left lobe of liver measures 1.0 x 0.4 cm, image 61/2. Previously 1.8 x 0.9 cm. Gallbladder negative. No biliary ductal dilatation. Pancreas: Unremarkable. No pancreatic ductal dilatation or surrounding inflammatory changes. Spleen: Normal in size without focal abnormality. Adrenals/Urinary Tract: Normal appearance of the adrenal glands. No kidney mass or hydronephrosis. Urinary bladder is unremarkable. Stomach/Bowel: Stomach is within normal limits. No evidence of bowel wall thickening, distention, or inflammatory changes. Vascular/Lymphatic: Aortic atherosclerosis. No aneurysm. IVC filter is in place. There is thrombus identified throughout the IVC, bilateral iliac veins and the visualized portions of the femoral veins. No abdominopelvic adenopathy identified. Reproductive: Fibroid uterus. No adnexal mass. Other: No free fluid or fluid collections. Musculoskeletal: No acute or significant osseous findings. IMPRESSION: 1. Interval decrease in size of cavitary lung mass within the left upper lobe compatible with response to therapy. 2. Interval decrease in size of multi focal liver lesions compatible with response to therapy. 3. Emphysema and aortic atherosclerosis. 4. Multi vessel coronary artery calcifications. 5. The IVC, proximal to the filter, appears distended and filled with thrombus which extends from bilateral lower extremities through the iliac veins. Aortic Atherosclerosis (ICD10-I70.0) and Emphysema (ICD10-J43.9). These results will be called to the ordering clinician or representative by the Radiologist Assistant, and communication documented in the PACS or zVision Dashboard. Electronically Signed   By: TKerby MoorsM.D.   On: 11/28/2019 14:41   ECHOCARDIOGRAM COMPLETE  Result Date: 11/29/2019    ECHOCARDIOGRAM REPORT   Patient Name:   Sarah RECINEDate of Exam: 11/29/2019 Medical Rec #:  [id number]      Height:       66.0 in Accession #:     [id number]     Weight:       158.0 lb Date of Birth:  [birth year]      BSA:          1.809 m Patient Age:    89years  BP:           129/84 mmHg Patient Gender: F               HR:           90 bpm. Exam Location:  [facility] Procedure: 2D Echo, Cardiac Doppler and Color Doppler Indications:     Chemotherapy evaluation V87.41  History:         Patient has prior history of Echocardiogram examinations, most                  recent 01/13/2017. Risk Factors:Hypertension. Breast cancer,                  heart attack, PE, PSVT.  Sonographer:     Sherrie Sport RDCS (AE) Referring Phys:  [id number] Cammie Sickle Diagnosing Phys: Kate Sable MD  Sonographer Comments: No apical window. Image acquisition challenging due to mastectomy. IMPRESSIONS  1. Left ventricular ejection fraction, by estimation, is 40 to 45%. The left ventricle has mild to moderately decreased function. The left ventricle demonstrates global hypokinesis. Left ventricular diastolic function could not be evaluated.  2. Right ventricular systolic function is normal. The right ventricular size is normal.  3. The mitral valve is normal in structure and function. No evidence of mitral valve regurgitation.  4. The aortic valve is tricuspid. Aortic valve regurgitation is not visualized.  5. Pulmonic valve regurgitation not assessed. FINDINGS  Left Ventricle: Left ventricular ejection fraction, by estimation, is 40 to 45%. The left ventricle has mild to moderately decreased function. The left ventricle demonstrates global hypokinesis. The left ventricular internal cavity size was normal in size. There is no left ventricular hypertrophy. Left ventricular diastolic function could not be evaluated. Right Ventricle: The right ventricular size is normal. Right vetricular wall thickness was not assessed. Right ventricular systolic function is normal. Left Atrium: Left atrial size was normal in size. Right Atrium: Right atrial size was not well visualized.  Pericardium: There is no evidence of pericardial effusion. Mitral Valve: The mitral valve is normal in structure and function. No evidence of mitral valve regurgitation. Tricuspid Valve: The tricuspid valve is grossly normal. Tricuspid valve regurgitation is not demonstrated. Aortic Valve: The aortic valve is tricuspid. Aortic valve regurgitation is not visualized. Pulmonic Valve: The pulmonic valve was not well visualized. Pulmonic valve regurgitation not assessed. Aorta: The aortic root is normal in size and structure. IAS/Shunts: No atrial level shunt detected by color flow Doppler.  LEFT VENTRICLE PLAX 2D LVIDd:         4.53 cm LVIDs:         3.30 cm LV PW:         0.99 cm LV IVS:        0.92 cm LVOT diam:     2.00 cm LVOT Area:     3.14 cm  LEFT ATRIUM         Index LA diam:    2.20 cm 1.22 cm/m                        PULMONIC VALVE AORTA                 PV Vmax:        0.52 m/s Ao Root diam: 2.90 cm PV Peak grad:   1.1 mmHg                       RVOT  Peak grad: 1 mmHg   SHUNTS Systemic Diam: 2.00 cm Kate Sable MD Electronically signed by Kate Sable MD Signature Date/Time: 11/29/2019/5:01:20 PM    Final     PERFORMANCE STATUS (ECOG) : 3 - Symptomatic, >50% confined to bed  Review of Systems Unless otherwise noted, a complete review of systems is negative.  Physical Exam General: NAD HEENT: Bilateral nystagmus noted on exam Pulmonary: Unlabored Extremities: no edema, no joint deformities Skin: no rashes Neurological: Weakness but otherwise nonfocal  IMPRESSION: Patient was scheduled for virtual visit today but was seen in the clinic due to progressive weakness.  Patient reports several days of dizziness.  She is also had some visual changes and reports feeling lines moving on her television.  Patient was found to have bilateral nystagmus on exam.  Patient was seen by Dr. Rogue Bussing and a stat MRI of the brain was ordered due to concern for progressive brain mets.  I worry about  patient's ability to take care of herself if she has progressive weakness.  She lives at home alone.  She has a son in [place] but she says that he lives in a homeless shelter and has schizophrenia.  She has another son in [place].  Patient does have a brother who is unclear how often he sees her in the home.  PLAN: -Stat MRI of the brain -Will be better able to address goals following results of MRI -DNR/DNI -RTC 2 weeks    Patient expressed understanding and was in agreement with this plan. She also understands that She can call the clinic at any time with any questions, concerns, or complaints.     Time Total: 15 minutes  Visit consisted of counseling and education dealing with the complex and emotionally intense issues of symptom management and palliative care in the setting of serious and potentially life-threatening illness.Greater than 50%  of this time was spent counseling and coordinating care related to the above assessment and plan.  Signed by: Altha Harm, PhD, NP-C

## 2019-12-17 ENCOUNTER — Telehealth: Payer: Self-pay | Admitting: Internal Medicine

## 2019-12-17 NOTE — Telephone Encounter (Signed)
On 3/19-spoke to patient's brother-at length regarding patient's clinical status.  Discussed that overall patient is not doing well given her dizziness/underlying breast cancer with metastasis to lung and brain.  Discussed that patient is high risk for falls/especially on anticoagulation high risk of death from falls.  Brother states that he works through the day; and can only get to help her out in the evening.  States that her son from [place] currently out of work and he would call him.  Brother feels that the patient's son could stay with the patient.  Addendum:   Heather/Tamika-please inform Patient that she has multiple stable cancer spots in the brain. patient's MRI brain does not show any stroke or any worsening of the spots of cancer in the brain.    Please make a referral to Dr. Brigitte Pulse neurology.  Please inform patient that I will speak to Dr. Orpah Cobb expedite her appointment.  Hold off chemotherapy for now.  We will plan to reach out to her later in the afternoon.

## 2019-12-18 NOTE — Telephone Encounter (Signed)
Attempted to reach patient. Left voice mail for patient to return my phone call.

## 2019-12-18 NOTE — Telephone Encounter (Signed)
Spoke with patient. Results and plan of care reviewed with patient. She gave verbal understanding of the plan of care.  Patient informed that the Neurology referral was faxed. Pending office review/apt. She will be contacted with an apt.

## 2019-12-19 ENCOUNTER — Telehealth: Payer: Self-pay | Admitting: Internal Medicine

## 2019-12-19 NOTE — Telephone Encounter (Signed)
Spoke to Dr. Manuella Ghazi neurology regarding patient's-nystagmus.  Possible etiologies include paraneoplastic/leptomeningeal disease vs-internal ear issues.  Kindly agrees to evaluate the patient at the earliest.

## 2019-12-19 NOTE — Telephone Encounter (Signed)
On 3/23- LVM re: my discussion with Dr.Shah/neurology.  Hold chemo for now. Follow up as planned.

## 2019-12-19 NOTE — Telephone Encounter (Signed)
Patient has an apt with Dr. Manuella Ghazi tomorrow. Our [facility] will transport her to the apt.

## 2019-12-20 DIAGNOSIS — G620 Drug-induced polyneuropathy: Secondary | ICD-10-CM | POA: Diagnosis not present

## 2019-12-20 DIAGNOSIS — R404 Transient alteration of awareness: Secondary | ICD-10-CM | POA: Diagnosis not present

## 2019-12-20 DIAGNOSIS — T451X5A Adverse effect of antineoplastic and immunosuppressive drugs, initial encounter: Secondary | ICD-10-CM | POA: Diagnosis not present

## 2019-12-21 ENCOUNTER — Telehealth: Payer: Self-pay | Admitting: *Deleted

## 2019-12-21 NOTE — Telephone Encounter (Signed)
Per JT in [place] transportation, Mrs. Vukelich gave him a copy of her future apts dates at [facility].  She will need [facility] transportation to these appointments.  The next apt dates are as follows:  02/08/20 - Thursday at 10 am for an EEG (Jette).  04/04/20 - 1130 am - apt at [facility] (with NP -  Gayland Curry).  Eldridge Abrahams will you enter these apts in epic for [place] transportation.

## 2019-12-21 NOTE — Progress Notes (Signed)
Pharmacist Chemotherapy Monitoring - Initial Assessment    Anticipated start date: 12/29/19  Regimen:  . Are orders appropriate based on the patient's diagnosis, regimen, and cycle? Yes . Does the plan date match the patient's scheduled date? Yes . Is the sequencing of drugs appropriate? Yes . Are the premedications appropriate for the patient's regimen? Yes . Prior Authorization for treatment is: Approved o If applicable, is the correct biosimilar selected based on the patient's insurance? not applicable  Organ Function and Labs: Marland Kitchen Are dose adjustments needed based on the patient's renal function, hepatic function, or hematologic function? No . Are appropriate labs ordered prior to the start of patient's treatment? Yes . Other organ system assessment, if indicated: N/A . The following baseline labs, if indicated, have been ordered: N/A  Dose Assessment: . Are the drug doses appropriate? Yes . Are the following correct: o Drug concentrations Yes o IV fluid compatible with drug Yes o Administration routes Yes o Timing of therapy Yes . If applicable, does the patient have documented access for treatment and/or plans for port-a-cath placement? yes . If applicable, have lifetime cumulative doses been properly documented and assessed?  .  Lifetime Dose Tracking  . Doxorubicin: 123.585 mg/m2 (228 mg) = 27.46 % of the maximum lifetime dose of 450 mg/m2  . Carboplatin: 2,361 mg = 0.01 % of the maximum lifetime dose of 999,999,999 mg  o   Toxicity Monitoring/Prevention: . The patient has the following take home antiemetics prescribed: N/A . The patient has the following take home medications prescribed: N/A . Medication allergies and previous infusion related reactions, if applicable, have been reviewed and addressed. Yes . The patient's current medication list has been assessed for drug-drug interactions with their chemotherapy regimen. no significant drug-drug interactions were identified  on review.  Order Review: . Are the treatment plan orders signed? No . Is the patient scheduled to see a provider prior to their treatment? Yes  I verify that I have reviewed each item in the above checklist and answered each question accordingly.  Adelina Mings 12/21/2019 2:02 PM

## 2019-12-22 DIAGNOSIS — I82401 Acute embolism and thrombosis of unspecified deep veins of right lower extremity: Secondary | ICD-10-CM | POA: Diagnosis not present

## 2019-12-22 DIAGNOSIS — C787 Secondary malignant neoplasm of liver and intrahepatic bile duct: Secondary | ICD-10-CM | POA: Diagnosis not present

## 2019-12-22 DIAGNOSIS — C7931 Secondary malignant neoplasm of brain: Secondary | ICD-10-CM | POA: Diagnosis not present

## 2019-12-22 DIAGNOSIS — C182 Malignant neoplasm of ascending colon: Secondary | ICD-10-CM | POA: Diagnosis not present

## 2019-12-22 DIAGNOSIS — G620 Drug-induced polyneuropathy: Secondary | ICD-10-CM | POA: Diagnosis not present

## 2019-12-22 DIAGNOSIS — I10 Essential (primary) hypertension: Secondary | ICD-10-CM | POA: Diagnosis not present

## 2019-12-22 DIAGNOSIS — C50412 Malignant neoplasm of upper-outer quadrant of left female breast: Secondary | ICD-10-CM | POA: Diagnosis not present

## 2019-12-22 DIAGNOSIS — T451X5D Adverse effect of antineoplastic and immunosuppressive drugs, subsequent encounter: Secondary | ICD-10-CM | POA: Diagnosis not present

## 2019-12-22 DIAGNOSIS — D702 Other drug-induced agranulocytosis: Secondary | ICD-10-CM | POA: Diagnosis not present

## 2019-12-26 DIAGNOSIS — D702 Other drug-induced agranulocytosis: Secondary | ICD-10-CM | POA: Diagnosis not present

## 2019-12-26 DIAGNOSIS — G620 Drug-induced polyneuropathy: Secondary | ICD-10-CM | POA: Diagnosis not present

## 2019-12-26 DIAGNOSIS — C50412 Malignant neoplasm of upper-outer quadrant of left female breast: Secondary | ICD-10-CM | POA: Diagnosis not present

## 2019-12-26 DIAGNOSIS — C7931 Secondary malignant neoplasm of brain: Secondary | ICD-10-CM | POA: Diagnosis not present

## 2019-12-26 DIAGNOSIS — T451X5D Adverse effect of antineoplastic and immunosuppressive drugs, subsequent encounter: Secondary | ICD-10-CM | POA: Diagnosis not present

## 2019-12-26 DIAGNOSIS — I82401 Acute embolism and thrombosis of unspecified deep veins of right lower extremity: Secondary | ICD-10-CM | POA: Diagnosis not present

## 2019-12-26 DIAGNOSIS — C787 Secondary malignant neoplasm of liver and intrahepatic bile duct: Secondary | ICD-10-CM | POA: Diagnosis not present

## 2019-12-26 DIAGNOSIS — C182 Malignant neoplasm of ascending colon: Secondary | ICD-10-CM | POA: Diagnosis not present

## 2019-12-26 DIAGNOSIS — I10 Essential (primary) hypertension: Secondary | ICD-10-CM | POA: Diagnosis not present

## 2019-12-29 ENCOUNTER — Inpatient Hospital Stay: Payer: Medicare HMO | Admitting: Hospice and Palliative Medicine

## 2019-12-29 ENCOUNTER — Inpatient Hospital Stay: Payer: Medicare HMO

## 2019-12-29 ENCOUNTER — Other Ambulatory Visit: Payer: Self-pay

## 2019-12-29 ENCOUNTER — Inpatient Hospital Stay (HOSPITAL_BASED_OUTPATIENT_CLINIC_OR_DEPARTMENT_OTHER): Payer: Medicare HMO | Admitting: Internal Medicine

## 2019-12-29 ENCOUNTER — Inpatient Hospital Stay: Payer: Medicare HMO | Attending: Internal Medicine

## 2019-12-29 DIAGNOSIS — Z86711 Personal history of pulmonary embolism: Secondary | ICD-10-CM | POA: Diagnosis not present

## 2019-12-29 DIAGNOSIS — T451X5A Adverse effect of antineoplastic and immunosuppressive drugs, initial encounter: Secondary | ICD-10-CM | POA: Insufficient documentation

## 2019-12-29 DIAGNOSIS — D701 Agranulocytosis secondary to cancer chemotherapy: Secondary | ICD-10-CM | POA: Insufficient documentation

## 2019-12-29 DIAGNOSIS — D702 Other drug-induced agranulocytosis: Secondary | ICD-10-CM | POA: Diagnosis not present

## 2019-12-29 DIAGNOSIS — C50412 Malignant neoplasm of upper-outer quadrant of left female breast: Secondary | ICD-10-CM

## 2019-12-29 DIAGNOSIS — G620 Drug-induced polyneuropathy: Secondary | ICD-10-CM | POA: Diagnosis not present

## 2019-12-29 DIAGNOSIS — I10 Essential (primary) hypertension: Secondary | ICD-10-CM | POA: Diagnosis not present

## 2019-12-29 DIAGNOSIS — T451X5D Adverse effect of antineoplastic and immunosuppressive drugs, subsequent encounter: Secondary | ICD-10-CM | POA: Diagnosis not present

## 2019-12-29 DIAGNOSIS — E785 Hyperlipidemia, unspecified: Secondary | ICD-10-CM | POA: Diagnosis not present

## 2019-12-29 DIAGNOSIS — Z5111 Encounter for antineoplastic chemotherapy: Secondary | ICD-10-CM | POA: Insufficient documentation

## 2019-12-29 DIAGNOSIS — Z170 Estrogen receptor positive status [ER+]: Secondary | ICD-10-CM

## 2019-12-29 DIAGNOSIS — F17210 Nicotine dependence, cigarettes, uncomplicated: Secondary | ICD-10-CM | POA: Diagnosis not present

## 2019-12-29 DIAGNOSIS — Z95828 Presence of other vascular implants and grafts: Secondary | ICD-10-CM

## 2019-12-29 DIAGNOSIS — C787 Secondary malignant neoplasm of liver and intrahepatic bile duct: Secondary | ICD-10-CM | POA: Insufficient documentation

## 2019-12-29 DIAGNOSIS — R7989 Other specified abnormal findings of blood chemistry: Secondary | ICD-10-CM | POA: Diagnosis not present

## 2019-12-29 DIAGNOSIS — Z5189 Encounter for other specified aftercare: Secondary | ICD-10-CM | POA: Insufficient documentation

## 2019-12-29 DIAGNOSIS — Z515 Encounter for palliative care: Secondary | ICD-10-CM

## 2019-12-29 DIAGNOSIS — M8580 Other specified disorders of bone density and structure, unspecified site: Secondary | ICD-10-CM | POA: Insufficient documentation

## 2019-12-29 DIAGNOSIS — C7931 Secondary malignant neoplasm of brain: Secondary | ICD-10-CM | POA: Diagnosis not present

## 2019-12-29 DIAGNOSIS — R000 Tachycardia, unspecified: Secondary | ICD-10-CM | POA: Diagnosis not present

## 2019-12-29 DIAGNOSIS — Z86718 Personal history of other venous thrombosis and embolism: Secondary | ICD-10-CM | POA: Insufficient documentation

## 2019-12-29 DIAGNOSIS — Z803 Family history of malignant neoplasm of breast: Secondary | ICD-10-CM | POA: Diagnosis not present

## 2019-12-29 DIAGNOSIS — I82401 Acute embolism and thrombosis of unspecified deep veins of right lower extremity: Secondary | ICD-10-CM | POA: Diagnosis not present

## 2019-12-29 DIAGNOSIS — Z79899 Other long term (current) drug therapy: Secondary | ICD-10-CM | POA: Insufficient documentation

## 2019-12-29 DIAGNOSIS — G629 Polyneuropathy, unspecified: Secondary | ICD-10-CM | POA: Insufficient documentation

## 2019-12-29 DIAGNOSIS — K5900 Constipation, unspecified: Secondary | ICD-10-CM | POA: Diagnosis not present

## 2019-12-29 DIAGNOSIS — Z7901 Long term (current) use of anticoagulants: Secondary | ICD-10-CM | POA: Insufficient documentation

## 2019-12-29 DIAGNOSIS — Z7189 Other specified counseling: Secondary | ICD-10-CM

## 2019-12-29 DIAGNOSIS — I252 Old myocardial infarction: Secondary | ICD-10-CM | POA: Insufficient documentation

## 2019-12-29 DIAGNOSIS — C7800 Secondary malignant neoplasm of unspecified lung: Secondary | ICD-10-CM | POA: Insufficient documentation

## 2019-12-29 DIAGNOSIS — Z833 Family history of diabetes mellitus: Secondary | ICD-10-CM | POA: Insufficient documentation

## 2019-12-29 DIAGNOSIS — C182 Malignant neoplasm of ascending colon: Secondary | ICD-10-CM | POA: Diagnosis not present

## 2019-12-29 LAB — COMPREHENSIVE METABOLIC PANEL
ALT: 28 U/L (ref 0–44)
AST: 41 U/L (ref 15–41)
Albumin: 3.5 g/dL (ref 3.5–5.0)
Alkaline Phosphatase: 179 U/L — ABNORMAL HIGH (ref 38–126)
Anion gap: 10 (ref 5–15)
BUN: 8 mg/dL (ref 8–23)
CO2: 27 mmol/L (ref 22–32)
Calcium: 8.8 mg/dL — ABNORMAL LOW (ref 8.9–10.3)
Chloride: 104 mmol/L (ref 98–111)
Creatinine, Ser: 0.86 mg/dL (ref 0.44–1.00)
GFR calc Af Amer: >60 mL/min (ref >60)
GFR calc non Af Amer: >60 mL/min (ref >60)
Glucose, Bld: 92 mg/dL (ref 70–99)
Potassium: 3.5 mmol/L (ref 3.5–5.1)
Sodium: 141 mmol/L (ref 135–145)
Total Bilirubin: 0.9 mg/dL (ref 0.3–1.2)
Total Protein: 7.1 g/dL (ref 6.5–8.1)

## 2019-12-29 LAB — CBC WITH DIFFERENTIAL/PLATELET
Abs Immature Granulocytes: 0 10*3/uL (ref 0.00–0.07)
Basophils Absolute: 0 10*3/uL (ref 0.0–0.1)
Basophils Relative: 1 %
Eosinophils Absolute: 0.1 10*3/uL (ref 0.0–0.5)
Eosinophils Relative: 3 %
HCT: 41.1 % (ref 36.0–46.0)
Hemoglobin: 13.1 g/dL (ref 12.0–15.0)
Immature Granulocytes: 0 %
Lymphocytes Relative: 20 %
Lymphs Abs: 0.6 10*3/uL — ABNORMAL LOW (ref 0.7–4.0)
MCH: 31.6 pg (ref 26.0–34.0)
MCHC: 31.9 g/dL (ref 30.0–36.0)
MCV: 99 fL (ref 80.0–100.0)
Monocytes Absolute: 0.3 10*3/uL (ref 0.1–1.0)
Monocytes Relative: 12 %
Neutro Abs: 1.9 10*3/uL (ref 1.7–7.7)
Neutrophils Relative %: 64 %
Platelets: 174 10*3/uL (ref 150–400)
RBC: 4.15 MIL/uL (ref 3.87–5.11)
RDW: 18 % — ABNORMAL HIGH (ref 11.5–15.5)
WBC: 2.9 10*3/uL — ABNORMAL LOW (ref 4.0–10.5)
nRBC: 0 % (ref 0.0–0.2)

## 2019-12-29 MED ORDER — SODIUM CHLORIDE 0.9 % IV SOLN
800.0000 mg/m2 | Freq: Once | INTRAVENOUS | Status: AC
  Administered 2019-12-29: 1444 mg via INTRAVENOUS
  Filled 2019-12-29: qty 26.3

## 2019-12-29 MED ORDER — SODIUM CHLORIDE 0.9 % IV SOLN
Freq: Once | INTRAVENOUS | Status: AC
  Filled 2019-12-29: qty 250

## 2019-12-29 MED ORDER — SODIUM CHLORIDE 0.9% FLUSH
10.0000 mL | INTRAVENOUS | Status: DC | PRN
  Filled 2019-12-29: qty 10

## 2019-12-29 MED ORDER — ONDANSETRON HCL 8 MG PO TABS: ORAL_TABLET | ORAL | 1 refills | Status: AC

## 2019-12-29 MED ORDER — SODIUM CHLORIDE 0.9% FLUSH
10.0000 mL | Freq: Once | INTRAVENOUS | Status: AC
  Administered 2019-12-29: 10 mL via INTRAVENOUS
  Filled 2019-12-29: qty 10

## 2019-12-29 MED ORDER — HEPARIN SOD (PORK) LOCK FLUSH 100 UNIT/ML IV SOLN
INTRAVENOUS | Status: AC
  Filled 2019-12-29: qty 5

## 2019-12-29 MED ORDER — PROCHLORPERAZINE MALEATE 10 MG PO TABS: 10.0000 mg | ORAL_TABLET | Freq: Four times a day (QID) | ORAL | 1 refills | Status: AC | PRN

## 2019-12-29 MED ORDER — HEPARIN SOD (PORK) LOCK FLUSH 100 UNIT/ML IV SOLN
500.0000 [IU] | Freq: Once | INTRAVENOUS | Status: AC | PRN
  Administered 2019-12-29: 11:00:00 500 [IU]
  Filled 2019-12-29: qty 5

## 2019-12-29 MED ORDER — PROCHLORPERAZINE MALEATE 10 MG PO TABS
10.0000 mg | ORAL_TABLET | Freq: Once | ORAL | Status: AC
  Administered 2019-12-29: 10:00:00 10 mg via ORAL
  Filled 2019-12-29: qty 1

## 2019-12-29 NOTE — Progress Notes (Signed)
[place]  [phone] [phone]   Name: Sarah Horne Date: 12/29/2019 MRN: [id number]  DOB: 28-Oct-1955  Patient Care Team: Arnetha Courser, MD as PCP - General (Family Medicine) Wellington Hampshire, MD as PCP - Cardiology (Cardiology) Bary Castilla, Forest Gleason, MD (General Surgery) Vladimir Crofts, MD as Consulting Physician (Neurology)    REASON FOR CONSULTATION: Sarah Horne is a 64 y.o. female with multiple medical problems including stage IV breast cancer with leptomeningeal and liver metastases who has had disease progression on multiple previous lines of treatment. MRI of the brain on 05/06/2019 showed a several new metastatic lesions. MRI of the brain on 07/06/2019 also showed evidence of disease progression with enlarging existing lesions and note of interval new metastatic lesions. MRI of the brain 12/15/2019 revealed stable disease.  Patient was most recently rotated to gemcitabine.  Patient was referred to palliative care to help address goals and manage ongoing symptoms.  SOCIAL HISTORY:     reports that she has been smoking cigarettes. She has a 9.00 pack-year smoking history. She has never used smokeless tobacco. She reports that she does not drink alcohol or use drugs.   Patient is not married. She lives at home alone. She has a son and daughter who live about 30 minutes away. She has a brother who is involved in her care. 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  ADVANCE DIRECTIVES:  HC POA and living will on file dated 12/12/2016. Patient's brother is her healthcare power of attorney.  CODE STATUS: DNR/DNI (MOST form completed 07/13/2019)  PAST MEDICAL HISTORY: Past Medical History:  Diagnosis Date   Chemotherapy-induced peripheral neuropathy ([place])    Epistaxis    a. 11/2016 in setting of asa/plavix-->silver nitrate cauterization.   GI bleed    a. 11/2016 Admission w/ GIB and  hypovolemic shock req 3u PRBC's;  b. 11/2016 ECG: gastritis & nonbleeding peptic ulcer; c. 11/2016 Conlonoscopy: rectal and sigmoid colonic ulcers.   Heart attack ([place])    a. 1998 Cath @ [facility]: reportedly no intervention required.   Herceptin-induced cardiomyopathy ([place])    a. In the setting of Herceptin Rx for breast cancer (initiated 12/2014); b. 03/2015 MUGA EF 64%; b. 08/2015 MUGA: EF 51%; c. 10/2015 MUGA: EF 44%; d. 11/2015 Echo: EF 45-50%; e. 01/2016 MUGA: EF 60%; f. 06/2016 MUGA EF 65%; g. 10/2016 MUGA: EF 61%;  h. 12/2016 Echo: EF 55-60%, gr1 DD.   Hyperlipidemia    Hypertension    Neuropathy    Possible PAD (peripheral artery disease) ([place])    a. 11/2016 LE cyanosis and weak pulses-->CTA w/o significant Ao-BiFem dzs. ? distal dzs-->ASA/Plavix initiated by vascular surgery.   PSVT (paroxysmal supraventricular tachycardia) ([place])    a. Dx 11/2016.   Pulmonary embolism ([place])    a. 12/2016 CTA Chest: small nonocclusive PE in inferior segment of the Left lingula, somewhat eccentric filling defect suggesting chronic rather than acute embolic event; b. 12/9177 LE U/S:  No DVT; c. 12/2016 Echo: Nl RV fxn, nl PASP.   Recurrent Metastatic breast cancer ([place])    a. Dx 2016: Stage II, ER positive, PR positive, HER-2/neu overexpressing of the left breast-->chemo/radiation; b. 10/2016 CT Abd/pelvis: diffuse liver mets, ill defined sclerotic bone lesions-T12;  c. 10/2016 MRI brain: metastatic lesion along L temporal lobe (19x89m) w/ extensive surrounding edema & 550mmidline shift to right.   [place]   Sinus tachycardia     PAST  SURGICAL HISTORY:  Past Surgical History:  Procedure Laterality Date   BREAST BIOPSY Left 2016   Positive   BREAST LUMPECTOMY WITH SENTINEL LYMPH NODE BIOPSY Left 05/23/2015   Procedure: LEFT BREAST WIDE EXCISION WITH AXILLARY DISSECTION, MASTOPLASTY ;  Surgeon: [name], MD;  Location: [facility] ORS;  Service: General;  Laterality: Left;   BREAST SURGERY Left  12/18/14   breast biopsy/INVASIVE DUCTAL CARCINOMA OF BREAST, NOTTINGHAM GRADE 2.   BREAST SURGERY  05/23/2015.   Wide excision/mastoplasty, axillary dissection. No residual invasive cancer, positive for residual DCIS. 0/2 nodes identified on axillary dissection. (no SLN by technetium or methylene blue)   CARDIAC CATHETERIZATION     COLONOSCOPY WITH PROPOFOL N/A 12/10/2016   Procedure: COLONOSCOPY WITH PROPOFOL;  Surgeon: Lucilla Lame, MD;  Location: [facility] ENDOSCOPY;  Service: Endoscopy;  Laterality: N/A;   COLONOSCOPY WITH PROPOFOL N/A 02/13/2017   Procedure: COLONOSCOPY WITH PROPOFOL;  Surgeon: Lucilla Lame, MD;  Location: [facility] ENDOSCOPY;  Service: Endoscopy;  Laterality: N/A;   ESOPHAGOGASTRODUODENOSCOPY N/A 11/03/2019   Procedure: ESOPHAGOGASTRODUODENOSCOPY (EGD);  Surgeon: Lin Landsman, MD;  [facility] ENDOSCOPY;  Service: Gastroenterology;  Laterality: N/A;   ESOPHAGOGASTRODUODENOSCOPY (EGD) WITH PROPOFOL N/A 12/08/2016   Procedure: ESOPHAGOGASTRODUODENOSCOPY (EGD) WITH PROPOFOL;  Surgeon: Lucilla Lame, MD;  Location: [facility] ENDOSCOPY;  Service: Endoscopy;  Laterality: N/A;   IVC FILTER INSERTION N/A 02/15/2017   Procedure: IVC Filter Insertion;  Surgeon: Algernon Huxley, MD;  Location: [place] CV LAB;  Service: Cardiovascular;  Laterality: N/A;   PORTACATH PLACEMENT Right 12-31-14   Dr Bary Castilla    HEMATOLOGY/ONCOLOGY HISTORY:  Oncology History Overview Note  # LEFT BREAST IDC; STAGE II [cT2N1] ER >90%; PR- 50-90%; her 2 NEU- POS; s/p Neoadj chemo; AUG 2016- TCH+P s/p Lumpec & partial ALND- path CR; s/p RT [finished Nov 2016];  adj Herceptin; HELD for Jan 19th 2017 [in DEC 2016-EF dropped from 63 to 51%; FEB 27th EF-42.9%]; JAN 2016 START Arimidex; May 2017- EF- 60%; May 24th 2017-Re-start Herceptin q 3W; July 28th STOP herceptin [finished 77m/m2; sec to Low EF; however 2017 Oct EF= 67%; improved]  # DEC 4th 2017- Start Neratinib 4 pills; DEC 11th 3 pills; STOPPED.  # FEB 2018-  RECURRENCE ER/PR positive; Her 2 NEGATIVE [liver Bx]  # MARCH 1st 2018- Tax-Cytoxan [poor tol]  # FEB 2018- Brain mets [SBRT; Dr.Crystal; finished Feb 28th 2018]  # March 2018- Eribulin- poor tolerance  # June 8th 2018- Started Faslodex + Abema [abema-multiple interruptions sec to neutropenia]  # MRI Brain- March 2019- Leptomeningeal mets [WBRT; No LP s/p WBRT- 01/09/2018]; STOP Abema [518mday-sec to severe cytopenia]+faslodex  # MAY 20tth 019- Start Xeloda; STOPPED in Nov 22nd 2019- sec to Progressive liver lesions on CT scan  # NOV 26th 2019- Faslodex + Piqaray 25073m; Jan 2020- Piqray 300 mg+ faslodex; FEB 17th CT scan- mixed response/ Overall progression; STOP Piqray + faslodex.   # FEB 25th 2020- ERIBULIN; Poor tolerance sec to severe neutropenia.  Stopped May 2020.  CT scan May 2020-new/progressive 2 cm liver lesion;   #July nd 2020-Ixempra x5 cycles-; NOV 2020-MRI liver- worsening liver lesions [rising AST/ALT].   # NOV [id number] weekly [consent-]; December 31-2020-progression/rising LFTs.  Stop Taxol  #January 15th, 2021-Adriamycin weekly [C]; MARCH 2021- Partial response. STOP ADRIAMYCIN sec to EF drop 40-45%.   # April 2nd-2021-gemcitabine   #May 06, 2019-MRI brain 5 new lesions [3 mm to 10 mm]-asymptomatic; [previous whole brain radiation]- OCT 2020- Re-Irradiation.   # PN- G-2; may  2017- Cymbalta 36m/d;MARCH 2018-  acute vascular insuff of BIL LE [? Taxotere vasoconstriction on asprin]; # hemorragic shock LGIB [s/p colo; Dr.Wohl]  #  SVT- on flecainide.   # April 2016- Liver Bx- NEG;   # Drop in EF from Herceptin [recovered OCt 27th- EF 65%]; March 2020 ejection fraction 40 to 45%.  Stop Adriamycin.  # BMD- jan 2017- osteopenia  # 2020- SEP-s/p Pallaitive care evaluation  -----------------------------------------------------------     [facility]- Sep 4th 2018- PDL-1 0%/NEG for BRCA; MSI-Stable; Positive for PI3K; ccnd-1;  FGF** -----------------------------------------------------------  Dx: Metastatic Breast cancer [ER/PR-Pos; her -2 NEG] Stage IV; goals: palliative Current treatment-gemcitabine chemotherapy [C]   Carcinoma of upper-outer quadrant of left breast in female, estrogen receptor positive ([place]  11/22/2018 - 01/17/2019 Chemotherapy   The patient had eriBULin mesylate (HALAVEN) 2.1 mg in sodium chloride 0.9 % 100 mL chemo infusion, 1.2 mg/m2 = 2.1 mg (100 % of original dose 1.2 mg/m2), Intravenous,  Once, 3 of 4 cycles Dose modification: 1.2 mg/m2 (original dose 1.2 mg/m2, Cycle 1, Reason: Provider Judgment), 0.8 mg/m2 (original dose 1.2 mg/m2, Cycle 3, Reason: Provider Judgment) Administration: 2.1 mg (11/22/2018), 2.1 mg (11/29/2018), 2.1 mg (12/20/2018), 1.4 mg (01/17/2019)  for chemotherapy treatment.    03/30/2019 - 07/12/2019 Chemotherapy   The patient had pegfilgrastim (NEULASTA ONPRO KIT) injection 6 mg, 6 mg, Subcutaneous, Once, 5 of 8 cycles Administration: 6 mg (03/30/2019), 6 mg (04/20/2019), 6 mg (05/11/2019), 6 mg (06/22/2019), 6 mg (06/01/2019) ixabepilone (IXEMPRA) 60 mg in lactated ringers 250 mL chemo infusion, 33.5 mg/m2 = 57 mg (100 % of original dose 32 mg/m2), Intravenous,  Once, 5 of 8 cycles Dose modification: 32 mg/m2 (original dose 32 mg/m2, Cycle 1, Reason: Provider Judgment) Administration: 72 mg (04/20/2019), 72 mg (05/11/2019), 72 mg (06/22/2019), 72 mg (06/01/2019)  for chemotherapy treatment.    08/18/2019 - 09/28/2019 Chemotherapy   The patient had PACLitaxel (TAXOL) 150 mg in sodium chloride 0.9 % 250 mL chemo infusion (</= 831mm2), 80 mg/m2 = 150 mg, Intravenous,  Once, 1 of 4 cycles Administration: 150 mg (08/18/2019), 150 mg (09/08/2019), 150 mg (09/15/2019)  for chemotherapy treatment.    10/13/2019 - 11/30/2019 Chemotherapy   The patient had DOXOrubicin (ADRIAMYCIN) chemo injection 38 mg, 20 mg/m2 = 38 mg, Intravenous,  Once, 6 of 9 cycles Administration: 38 mg (10/13/2019), 38  mg (10/20/2019), 38 mg (10/27/2019), 38 mg (11/17/2019), 38 mg (11/24/2019), 38 mg (11/10/2019) palonosetron (ALOXI) injection 0.25 mg, 0.25 mg, Intravenous,  Once, 6 of 9 cycles Administration: 0.25 mg (10/13/2019), 0.25 mg (10/20/2019), 0.25 mg (10/27/2019), 0.25 mg (11/17/2019), 0.25 mg (11/24/2019), 0.25 mg (11/10/2019)  for chemotherapy treatment.    12/29/2019 -  Chemotherapy   The patient had gemcitabine (GEMZAR) 1,444 mg in sodium chloride 0.9 % 250 mL chemo infusion, 800 mg/m2 = 1,444 mg, Intravenous,  Once, 1 of 4 cycles  for chemotherapy treatment.      ALLERGIES:  is allergic to no known allergies.  MEDICATIONS:  Current Outpatient Medications  Medication Sig Dispense Refill   acetaminophen (TYLENOL) 650 MG CR tablet Take 650 mg by mouth every 8 (eight) hours as needed for pain.     apixaban (ELIQUIS) 5 MG TABS tablet Take 1 tablet (5 mg total) by mouth 2 (two) times daily. 60 tablet    flecainide (TAMBOCOR) 50 MG tablet Take 1 tablet (50 mg total) by mouth 2 (two) times daily. 60 tablet 4   ipratropium (ATROVENT) 0.03 % nasal spray Place 2  sprays into both nostrils every 12 (twelve) hours. 30 mL 12   levocetirizine (XYZAL) 5 MG tablet Take 1 tablet (5 mg total) by mouth every evening. 30 tablet 2   magnesium oxide (MAG-OX) 400 MG tablet Take 1 tablet (400 mg total) by mouth 2 (two) times daily. 60 tablet 5   meclizine (ANTIVERT) 25 MG tablet Take 1 tablet (25 mg total) by mouth 3 (three) times daily as needed for dizziness. 30 tablet 0   metoprolol tartrate (LOPRESSOR) 25 MG tablet Take 1 tablet (25 mg total) by mouth 2 (two) times daily. 60 tablet 1   montelukast (SINGULAIR) 10 MG tablet Take 1 tablet (10 mg total) by mouth at bedtime. 60 tablet 3   ondansetron (ZOFRAN) 8 MG tablet One pill every 8 hours as needed for nausea/vomitting. 40 tablet 1   pregabalin (LYRICA) 100 MG capsule Take 1 capsule (100 mg total) by mouth 2 (two) times daily. 60 capsule 2   prochlorperazine  (COMPAZINE) 10 MG tablet Take 1 tablet (10 mg total) by mouth every 6 (six) hours as needed for nausea or vomiting. 40 tablet 1   Vitamin D, Ergocalciferol, (DRISDOL) 1.25 MG (50000 UNIT) CAPS capsule TAKE 1 CAPSULE (50,000 UNITS TOTAL) ONCE A WEEK BY MOUTH. 12 capsule 1   No current facility-administered medications for this visit.   Facility-Administered Medications Ordered in Other Visits  Medication Dose Route Frequency Provider Last Rate Last Admin   0.9 %  sodium chloride infusion   Intravenous Continuous [name], MD   New Bag at 12/10/16 1140   0.9 %  sodium chloride infusion   Intravenous Continuous Lloyd Huger, MD 999 mL/hr at 04/24/15 1520 New Bag at 05/23/15 0951   0.9 %  sodium chloride infusion   Intravenous Continuous Verlon Au, NP 999 mL/hr at 10/31/19 1522 New Bag at 10/31/19 1522   gemcitabine (GEMZAR) 1,444 mg in sodium chloride 0.9 % 250 mL chemo infusion  800 mg/m2 (Treatment Plan Recorded) Intravenous Once Cammie Sickle, MD 576 mL/hr at 12/29/19 1039 1,444 mg at 12/29/19 1039   heparin lock flush 100 unit/mL  500 Units Intravenous Once Berenzon, Dmitriy, MD       [COMPLETED] heparin lock flush 100 unit/mL  500 Units Intracatheter Once PRN Cammie Sickle, MD   500 Units at 12/29/19 1115   sodium chloride 0.9 % injection 10 mL  10 mL Intravenous PRN [name], MD   10 mL at 04/04/15 1440   sodium chloride flush (NS) 0.9 % injection 10 mL  10 mL Intravenous PRN Berenzon, Dmitriy, MD       sodium chloride flush (NS) 0.9 % injection 10 mL  10 mL Intracatheter PRN Cammie Sickle, MD        VITAL SIGNS: There were no vitals taken for this visit. There were no vitals filed for this visit.  Estimated body mass index is 23.08 kg/m as calculated from the following:   Height as of an earlier encounter on 12/29/19: 5' 6"  (1.676 m).   Weight as of an earlier encounter on 12/29/19: 143 lb (64.9 kg).  LABS: CBC:    Component  Value Date/Time   WBC 2.9 (L) 12/29/2019 0853   HGB 13.1 12/29/2019 0853   HGB 10.2 (L) 03/04/2017 0918   HCT 41.1 12/29/2019 0853   HCT 33.3 (L) 03/04/2017 0918   PLT 174 12/29/2019 0853   PLT 225 03/04/2017 0918   MCV 99.0 12/29/2019 0853   MCV 89 03/04/2017  0918   MCV 90 01/22/2015 1559   NEUTROABS 1.9 12/29/2019 0853   NEUTROABS 3.1 03/04/2017 0918   NEUTROABS 34.8 (H) 01/22/2015 1559   LYMPHSABS 0.6 (L) 12/29/2019 0853   LYMPHSABS 0.7 03/04/2017 0918   LYMPHSABS 3.4 01/22/2015 1559   MONOABS 0.3 12/29/2019 0853   MONOABS 1.0 (H) 01/22/2015 1559   EOSABS 0.1 12/29/2019 0853   EOSABS 0.1 03/04/2017 0918   EOSABS 0.0 01/22/2015 1559   BASOSABS 0.0 12/29/2019 0853   BASOSABS 0.0 03/04/2017 0918   BASOSABS 0.0 01/22/2015 1559   Comprehensive Metabolic Panel:    Component Value Date/Time   NA 141 12/29/2019 0853   NA 135 03/04/2017 0918   NA 135 01/08/2015 0850   K 3.5 12/29/2019 0853   K 3.7 01/08/2015 0850   CL 104 12/29/2019 0853   CL 101 01/08/2015 0850   CO2 27 12/29/2019 0853   CO2 26 01/08/2015 0850   BUN 8 12/29/2019 0853   BUN 8 03/04/2017 0918   BUN 17 01/08/2015 0850   CREATININE 0.86 12/29/2019 0853   CREATININE 0.82 01/22/2015 1559   GLUCOSE 92 12/29/2019 0853   GLUCOSE 161 (H) 01/08/2015 0850   CALCIUM 8.8 (L) 12/29/2019 0853   CALCIUM 9.3 01/08/2015 0850   AST 41 12/29/2019 0853   AST 34 01/22/2015 1559   ALT 28 12/29/2019 0853   ALT 42 01/22/2015 1559   ALKPHOS 179 (H) 12/29/2019 0853   ALKPHOS 157 (H) 01/22/2015 1559   BILITOT 0.9 12/29/2019 0853   BILITOT 6.2 (H) 03/04/2017 0918   BILITOT 0.2 (L) 01/22/2015 1559   PROT 7.1 12/29/2019 0853   PROT 6.4 03/04/2017 0918   PROT 6.8 01/22/2015 1559   ALBUMIN 3.5 12/29/2019 0853   ALBUMIN 2.5 (L) 03/04/2017 0918   ALBUMIN 3.9 01/22/2015 1559    RADIOGRAPHIC STUDIES: MR Brain W Wo Contrast  Result Date: 12/15/2019 CLINICAL DATA:  New onset nystagmus.  Metastatic breast cancer. EXAM: MRI HEAD  WITHOUT AND WITH CONTRAST TECHNIQUE: Multiplanar, multiecho pulse sequences of the brain and surrounding structures were obtained without and with intravenous contrast. CONTRAST:  45m GADAVIST GADOBUTROL 1 MMOL/ML IV SOLN COMPARISON:  11/02/2019.  08/31/2019. FINDINGS: Brain: Stable examination since the last high quality exam 08/31/2019. There was motion degradation on the study of February. Numerous small metastatic lesions scattered within the cerebellum and cerebral hemispheres are all stable or smaller. None are newly seen or enlarging. None associated with mass effect or edema. Widespread post radiation T2 changes throughout the brain as seen previously. No hemorrhage, hydrocephalus or extra-axial collection. Metastatic lesions are all marked with single arrows. Posterior fossa: Four small lesions marked with arrows. Right cerebral hemisphere: 4 small lesions marked with arrows. Most extensive abnormality is enhancement along the surface of a right frontal gyrus, which is not progressive. Left cerebral hemisphere: 3 small lesions marked with arrows. Vascular: Major vessels at the base of the brain show flow. Skull and upper cervical spine: Negative Sinuses/Orbits: Clear/normal Other: Small bilateral mastoid effusions. IMPRESSION: Stable examination. No new or progressive disease. Total of 11 foci of enhancement visible on today's study, the same as the previous 2 exams, without new or progressive lesion. Lesions are marked by single arrows. Electronically Signed   By: MNelson ChimesM.D.   On: 12/15/2019 12:57    PERFORMANCE STATUS (ECOG) : 3 - Symptomatic, >50% confined to bed  Review of Systems Unless otherwise noted, a complete review of systems is negative.  Physical Exam General: NAD Pulmonary: Unlabored  Extremities: no edema, no joint deformities Skin: no rashes Neurological: Weakness but otherwise nonfocal  IMPRESSION: Routine follow-up visit today.  Patient was seen in the infusion  area.  Patient reports that she is doing well.  She denies any significant changes or concerns.  She says that the dizziness spells have resolved.  She is being followed by neurology.  Stable MRI of the brain on 12/15/2019.  Patient denies new symptomatic complaints.  She has occasional pain but says it is overall stable.  Follow weight trends.  Patient has lost about 15 pounds over the past several months.  PLAN: -Continue current scope of treatment -Referral to nutrition for weight loss -Follow-up virtual visit 1 to 2 months    Patient expressed understanding and was in agreement with this plan. She also understands that She can call the clinic at any time with any questions, concerns, or complaints.     Time Total: 15 minutes  Visit consisted of counseling and education dealing with the complex and emotionally intense issues of symptom management and palliative care in the setting of serious and potentially life-threatening illness.Greater than 50%  of this time was spent counseling and coordinating care related to the above assessment and plan.  Signed by: Altha Harm, PhD, NP-C

## 2019-12-29 NOTE — Progress Notes (Signed)
[place] OFFICE PROGRESS NOTE  Patient Care Team: Lada, Satira Anis, MD as PCP - General (Family Medicine) Wellington Hampshire, MD as PCP - Cardiology (Cardiology) Bary Castilla Forest Gleason, MD (General Surgery) Vladimir Crofts, MD as Consulting Physician (Neurology)  Cancer Staging No matching staging information was found for the patient.   Oncology History Overview Note  # LEFT BREAST IDC; STAGE II [cT2N1] ER >90%; PR- 50-90%; her 2 NEU- POS; s/p Neoadj chemo; AUG 2016- TCH+P s/p Lumpec & partial ALND- path CR; s/p RT [finished Nov 2016];  adj Herceptin; HELD for Jan 19th 2017 [in DEC 2016-EF dropped from 63 to 51%; FEB 27th EF-42.9%]; JAN 2016 START Arimidex; May 2017- EF- 60%; May 24th 2017-Re-start Herceptin q 3W; July 28th STOP herceptin [finished 5m/m2; sec to Low EF; however 2017 Oct EF= 67%; improved]  # DEC 4th 2017- Start Neratinib 4 pills; DEC 11th 3 pills; STOPPED.  # FEB 2018- RECURRENCE ER/PR positive; Her 2 NEGATIVE [liver Bx]  # MARCH 1st 2018- Tax-Cytoxan [poor tol]  # FEB 2018- Brain mets [SBRT; Dr.Crystal; finished Feb 28th 2018]  # March 2018- Eribulin- poor tolerance  # June 8th 2018- Started Faslodex + Abema [abema-multiple interruptions sec to neutropenia]  # MRI Brain- March 2019- Leptomeningeal mets [WBRT; No LP s/p WBRT- 01/09/2018]; STOP Abema [536mday-sec to severe cytopenia]+faslodex  # MAY 20tth 019- Start Xeloda; STOPPED in Nov 22nd 2019- sec to Progressive liver lesions on CT scan  # NOV 26th 2019- Faslodex + Piqaray 25067m; Jan 2020- Piqray 300 mg+ faslodex; FEB 17th CT scan- mixed response/ Overall progression; STOP Piqray + faslodex.   # FEB 25th 2020- ERIBULIN; Poor tolerance sec to severe neutropenia.  Stopped May 2020.  CT scan May 2020-new/progressive 2 cm liver lesion;   #July nd 2020-Ixempra x5 cycles-; NOV 2020-MRI liver- worsening liver lesions [rising AST/ALT].   # NOV [id number] weekly [consent-]; December  31-2020-progression/rising LFTs.  Stop Taxol  #January 15th, 2021-Adriamycin weekly [C]; MARCH 2021- Partial response. STOP ADRIAMYCIN sec to EF drop 40-45%.   # April 2nd-2021-gemcitabine   #May 06, 2019-MRI brain 5 new lesions [3 mm to 10 mm]-asymptomatic; [previous whole brain radiation]- OCT 2020- Re-Irradiation.   # PN- G-2; may 2017- Cymbalta 73m51mMARCH 2018-  acute vascular insuff of BIL LE [? Taxotere vasoconstriction on asprin]; # hemorragic shock LGIB [s/p colo; Dr.Wohl]  #  SVT- on flecainide.   # April 2016- Liver Bx- NEG;   # Drop in EF from Herceptin [recovered OCt 27th- EF 65%]; March 2020 ejection fraction 40 to 45%.  Stop Adriamycin.  # BMD- jan 2017- osteopenia  # 2020- SEP-s/p Pallaitive care evaluation  -----------------------------------------------------------     [place]- Sep 4th 2018- PDL-1 0%/NEG for BRCA; MSI-Stable; Positive for PI3K; ccnd-1; FGF** -----------------------------------------------------------  Dx: Metastatic Breast cancer [ER/PR-Pos; her -2 NEG] Stage IV; goals: palliative Current treatment-gemcitabine chemotherapy [C]   Carcinoma of upper-outer quadrant of left breast in female, estrogen receptor positive (HCC)[place]/25/2020 - 01/17/2019 Chemotherapy   The patient had eriBULin mesylate (HALAVEN) 2.1 mg in sodium chloride 0.9 % 100 mL chemo infusion, 1.2 mg/m2 = 2.1 mg (100 % of original dose 1.2 mg/m2), Intravenous,  Once, 3 of 4 cycles Dose modification: 1.2 mg/m2 (original dose 1.2 mg/m2, Cycle 1, Reason: Provider Judgment), 0.8 mg/m2 (original dose 1.2 mg/m2, Cycle 3, Reason: Provider Judgment) Administration: 2.1 mg (11/22/2018), 2.1 mg (11/29/2018), 2.1 mg (12/20/2018), 1.4 mg (01/17/2019)  for chemotherapy treatment.  03/30/2019 - 07/12/2019 Chemotherapy   The patient had pegfilgrastim (NEULASTA ONPRO KIT) injection 6 mg, 6 mg, Subcutaneous, Once, 5 of 8 cycles Administration: 6 mg (03/30/2019), 6 mg  (04/20/2019), 6 mg (05/11/2019), 6 mg (06/22/2019), 6 mg (06/01/2019) ixabepilone (IXEMPRA) 60 mg in lactated ringers 250 mL chemo infusion, 33.5 mg/m2 = 57 mg (100 % of original dose 32 mg/m2), Intravenous,  Once, 5 of 8 cycles Dose modification: 32 mg/m2 (original dose 32 mg/m2, Cycle 1, Reason: Provider Judgment) Administration: 72 mg (04/20/2019), 72 mg (05/11/2019), 72 mg (06/22/2019), 72 mg (06/01/2019)  for chemotherapy treatment.    08/18/2019 - 09/28/2019 Chemotherapy   The patient had PACLitaxel (TAXOL) 150 mg in sodium chloride 0.9 % 250 mL chemo infusion (</= '80mg'$ /m2), 80 mg/m2 = 150 mg, Intravenous,  Once, 1 of 4 cycles Administration: 150 mg (08/18/2019), 150 mg (09/08/2019), 150 mg (09/15/2019)  for chemotherapy treatment.    10/13/2019 - 11/30/2019 Chemotherapy   The patient had DOXOrubicin (ADRIAMYCIN) chemo injection 38 mg, 20 mg/m2 = 38 mg, Intravenous,  Once, 6 of 9 cycles Administration: 38 mg (10/13/2019), 38 mg (10/20/2019), 38 mg (10/27/2019), 38 mg (11/17/2019), 38 mg (11/24/2019), 38 mg (11/10/2019) palonosetron (ALOXI) injection 0.25 mg, 0.25 mg, Intravenous,  Once, 6 of 9 cycles Administration: 0.25 mg (10/13/2019), 0.25 mg (10/20/2019), 0.25 mg (10/27/2019), 0.25 mg (11/17/2019), 0.25 mg (11/24/2019), 0.25 mg (11/10/2019)  for chemotherapy treatment.    12/29/2019 -  Chemotherapy   The patient had gemcitabine (GEMZAR) 1,444 mg in sodium chloride 0.9 % 250 mL chemo infusion, 800 mg/m2 = 1,444 mg, Intravenous,  Once, 1 of 4 cycles  for chemotherapy treatment.      INTERVAL HISTORY:  MILILANI MURTHY 64 y.o.  female pleasant patient above history of metastatic breast cancer ER PR positive/leptomeningeal disease metastatic to [place] recently on Adriamycin is here for follow-up.  Patient is here to start gemcitabine given the decline in ejection fraction.  In the interim patient was evaluated by neurology-for her visual disturbance.  She is recommended EEG-for the low clinical suspicion  of seizures.  Not started on any antiseizure medication.  Patient states that her gait is improved.  She has not had any falls.  No swelling of the legs.  Her vision changes have improved  Review of Systems  Constitutional: Positive for malaise/fatigue. Negative for chills, diaphoresis, fever and weight loss.  HENT: Negative for nosebleeds and sore throat.   Eyes: Negative for double vision.  Respiratory: Negative for cough, hemoptysis, sputum production, shortness of breath and wheezing.   Cardiovascular: Negative for chest pain, palpitations, orthopnea and leg swelling.  Gastrointestinal: Negative for abdominal pain, blood in stool, constipation, heartburn, melena, nausea and vomiting.  Genitourinary: Negative for dysuria, frequency and urgency.  Musculoskeletal: Positive for joint pain. Negative for back pain.  Skin: Negative for itching.  Neurological: Positive for dizziness and tingling. Negative for focal weakness, weakness and headaches.  Endo/Heme/Allergies: Does not bruise/bleed easily.  Psychiatric/Behavioral: Negative for depression. The patient is not nervous/anxious and does not have insomnia.     PAST MEDICAL HISTORY :  Past Medical History:  Diagnosis Date  . Chemotherapy-induced peripheral neuropathy ([place])   . Epistaxis    a. 11/2016 in setting of asa/plavix-->silver nitrate cauterization.  . GI bleed    a. 11/2016 Admission w/ GIB and hypovolemic shock req 3u PRBC's;  b. 11/2016 ECG: gastritis & nonbleeding peptic ulcer; c. 11/2016 Conlonoscopy: rectal and sigmoid colonic ulcers.  . Heart attack ([place])    a. 1998  Cath @ [facility]: reportedly no intervention required.  Marland Kitchen Herceptin-induced cardiomyopathy ([place])    a. In the setting of Herceptin Rx for breast cancer (initiated 12/2014); b. 03/2015 MUGA EF 64%; b. 08/2015 MUGA: EF 51%; c. 10/2015 MUGA: EF 44%; d. 11/2015 Echo: EF 45-50%; e. 01/2016 MUGA: EF 60%; f. 06/2016 MUGA EF 65%; g. 10/2016 MUGA: EF 61%;  h. 12/2016 Echo: EF 55-60%, gr1  DD.  Marland Kitchen Hyperlipidemia   . Hypertension   . Neuropathy   . Possible PAD (peripheral artery disease) ([place])    a. 11/2016 LE cyanosis and weak pulses-->CTA w/o significant Ao-BiFem dzs. ? distal dzs-->ASA/Plavix initiated by vascular surgery.  Marland Kitchen PSVT (paroxysmal supraventricular tachycardia) ([place])    a. Dx 11/2016.  . Pulmonary embolism ([place])    a. 12/2016 CTA Chest: small nonocclusive PE in inferior segment of the Left lingula, somewhat eccentric filling defect suggesting chronic rather than acute embolic event; b. 01/1699 LE U/S:  No DVT; c. 12/2016 Echo: Nl RV fxn, nl PASP.  Marland Kitchen Recurrent Metastatic breast cancer ([place])    a. Dx 2016: Stage II, ER positive, PR positive, HER-2/neu overexpressing of the left breast-->chemo/radiation; b. 10/2016 CT Abd/pelvis: diffuse liver mets, ill defined sclerotic bone lesions-T12;  c. 10/2016 MRI brain: metastatic lesion along L temporal lobe (19x58m) w/ extensive surrounding edema & 580mmidline shift to right.  . [place]  . Sinus tachycardia     PAST SURGICAL HISTORY :   Past Surgical History:  Procedure Laterality Date  . BREAST BIOPSY Left 2016   Positive  . BREAST LUMPECTOMY WITH SENTINEL LYMPH NODE BIOPSY Left 05/23/2015   Procedure: LEFT BREAST WIDE EXCISION WITH AXILLARY DISSECTION, MASTOPLASTY ;  Surgeon: JeRobert BellowMD;  Location: [facility] ORS;  Service: General;  Laterality: Left;  . BREAST SURGERY Left 12/18/14   breast biopsy/INVASIVE DUCTAL CARCINOMA OF BREAST, NOTTINGHAM GRADE 2.  . Marland KitchenREAST SURGERY  05/23/2015.   Wide excision/mastoplasty, axillary dissection. No residual invasive cancer, positive for residual DCIS. 0/2 nodes identified on axillary dissection. (no SLN by technetium or methylene blue)  . CARDIAC CATHETERIZATION    . COLONOSCOPY WITH PROPOFOL N/A 12/10/2016   Procedure: COLONOSCOPY WITH PROPOFOL;  Surgeon: DaLucilla LameMD;  Location: [facility] ENDOSCOPY;  Service: Endoscopy;  Laterality: N/A;  . COLONOSCOPY WITH PROPOFOL N/A  02/13/2017   Procedure: COLONOSCOPY WITH PROPOFOL;  Surgeon: WoLucilla LameMD;  [facility];  Service: Endoscopy;  Laterality: N/A;  . ESOPHAGOGASTRODUODENOSCOPY N/A 11/03/2019   Procedure: ESOPHAGOGASTRODUODENOSCOPY (EGD);  Surgeon: VaLin LandsmanMD;  [facility];  Service: Gastroenterology;  Laterality: N/A;  . ESOPHAGOGASTRODUODENOSCOPY (EGD) WITH PROPOFOL N/A 12/08/2016   Procedure: ESOPHAGOGASTRODUODENOSCOPY (EGD) WITH PROPOFOL;  Surgeon: DaLucilla LameMD;  Location: [facility] ENDOSCOPY;  Service: Endoscopy;  Laterality: N/A;  . IVC FILTER INSERTION N/A 02/15/2017   Procedure: IVC Filter Insertion;  Surgeon: DeAlgernon HuxleyMD;  [facility];  Service: Cardiovascular;  Laterality: N/A;  . PORTACATH PLACEMENT Right 12-31-14   Dr ByBary Castilla  FAMILY HISTORY :   Family History  Problem Relation Age of Onset  . Breast cancer Maternal Aunt   . Breast cancer Cousin   . Brain cancer Maternal Uncle   . Diabetes Mother   . Hypertension Mother   . Stroke Mother     SOCIAL HISTORY:   Social History   Tobacco Use  . Smoking status: Light Tobacco Smoker    Packs/day: 0.50    Years: 18.00  Pack years: 9.00    Types: Cigarettes  . Smokeless tobacco: Never Used  Substance Use Topics  . Alcohol use: No    Alcohol/week: 0.0 standard drinks  . Drug use: No    ALLERGIES:  is allergic to no known allergies.  MEDICATIONS:  Current Outpatient Medications  Medication Sig Dispense Refill  . acetaminophen (TYLENOL) 650 MG CR tablet Take 650 mg by mouth every 8 (eight) hours as needed for pain.    Marland Kitchen apixaban (ELIQUIS) 5 MG TABS tablet Take 1 tablet (5 mg total) by mouth 2 (two) times daily. 60 tablet   . flecainide (TAMBOCOR) 50 MG tablet Take 1 tablet (50 mg total) by mouth 2 (two) times daily. 60 tablet 4  . ipratropium (ATROVENT) 0.03 % nasal spray Place 2 sprays into both nostrils every 12 (twelve) hours. 30 mL 12  . levocetirizine (XYZAL) 5 MG tablet  Take 1 tablet (5 mg total) by mouth every evening. 30 tablet 2  . magnesium oxide (MAG-OX) 400 MG tablet Take 1 tablet (400 mg total) by mouth 2 (two) times daily. 60 tablet 5  . meclizine (ANTIVERT) 25 MG tablet Take 1 tablet (25 mg total) by mouth 3 (three) times daily as needed for dizziness. 30 tablet 0  . metoprolol tartrate (LOPRESSOR) 25 MG tablet Take 1 tablet (25 mg total) by mouth 2 (two) times daily. 60 tablet 1  . montelukast (SINGULAIR) 10 MG tablet Take 1 tablet (10 mg total) by mouth at bedtime. 60 tablet 3  . pregabalin (LYRICA) 100 MG capsule Take 1 capsule (100 mg total) by mouth 2 (two) times daily. 60 capsule 2  . Vitamin D, Ergocalciferol, (DRISDOL) 1.25 MG (50000 UNIT) CAPS capsule TAKE 1 CAPSULE (50,000 UNITS TOTAL) ONCE A WEEK BY MOUTH. 12 capsule 1  . ondansetron (ZOFRAN) 8 MG tablet One pill every 8 hours as needed for nausea/vomitting. 40 tablet 1  . prochlorperazine (COMPAZINE) 10 MG tablet Take 1 tablet (10 mg total) by mouth every 6 (six) hours as needed for nausea or vomiting. 40 tablet 1   No current facility-administered medications for this visit.   Facility-Administered Medications Ordered in Other Visits  Medication Dose Route Frequency Provider Last Rate Last Admin  . 0.9 %  sodium chloride infusion   Intravenous Continuous [name], MD   New Bag at 12/10/16 1140  . 0.9 %  sodium chloride infusion   Intravenous Continuous Lloyd Huger, MD 999 mL/hr at 04/24/15 1520 New Bag at 05/23/15 0951  . 0.9 %  sodium chloride infusion   Intravenous Continuous Verlon Au, NP 999 mL/hr at 10/31/19 1522 New Bag at 10/31/19 1522  . gemcitabine (GEMZAR) 1,444 mg in sodium chloride 0.9 % 250 mL chemo infusion  800 mg/m2 (Treatment Plan Recorded) Intravenous Once Charlaine Dalton R, MD      . heparin lock flush 100 unit/mL  500 Units Intravenous Once Berenzon, Dmitriy, MD      . heparin lock flush 100 unit/mL  500 Units Intracatheter Once PRN Cammie Sickle, MD      . prochlorperazine (COMPAZINE) tablet 10 mg  10 mg Oral Once Charlaine Dalton R, MD      . sodium chloride 0.9 % injection 10 mL  10 mL Intravenous PRN [name], MD   10 mL at 04/04/15 1440  . sodium chloride flush (NS) 0.9 % injection 10 mL  10 mL Intravenous PRN Berenzon, Dmitriy, MD      . sodium chloride flush (NS)  0.9 % injection 10 mL  10 mL Intracatheter PRN Cammie Sickle, MD        PHYSICAL EXAMINATION: ECOG PERFORMANCE STATUS: 1 - Symptomatic but completely ambulatory  BP (!) 140/103 (BP Location: Right Arm, Patient Position: Sitting)   Pulse (!) 101   Temp (!) 97.3 F (36.3 C) (Tympanic)   Resp 20   Ht _0  (1.676 m)   Wt 143 lb (64.9 kg)   BMI 23.08 kg/m   Filed Weights   12/29/19 0905  Weight: 143 lb (64.9 kg)    Physical Exam  Constitutional: She is oriented to person, place, and time and well-developed, well-nourished, and in no distress.  Patient is alone.  In wheel chair.   HENT:  Head: Normocephalic and atraumatic.  Mouth/Throat: Oropharynx is clear and moist. No oropharyngeal exudate.  Eyes: Pupils are equal, round, and reactive to light.  Cardiovascular: Normal rate and regular rhythm.  Pulmonary/Chest: Effort normal and breath sounds normal. No respiratory distress. She has no wheezes.  Abdominal: Soft. Bowel sounds are normal. She exhibits no distension and no mass. There is no abdominal tenderness. There is no rebound and no guarding.  Musculoskeletal:        General: No tenderness. Normal range of motion.     Cervical back: Normal range of motion and neck supple.  Neurological: She is alert and oriented to person, place, and time.  Broad based gait.  Unsteady.  Skin: Skin is warm.  Psychiatric: Affect normal.    LABORATORY DATA:  I have reviewed the data as listed    Component Value Date/Time   NA 141 12/29/2019 0853   NA 135 03/04/2017 0918   NA 135 01/08/2015 0850   K 3.5 12/29/2019 0853   K 3.7  01/08/2015 0850   CL 104 12/29/2019 0853   CL 101 01/08/2015 0850   CO2 27 12/29/2019 0853   CO2 26 01/08/2015 0850   GLUCOSE 92 12/29/2019 0853   GLUCOSE 161 (H) 01/08/2015 0850   BUN 8 12/29/2019 0853   BUN 8 03/04/2017 0918   BUN 17 01/08/2015 0850   CREATININE 0.86 12/29/2019 0853   CREATININE 0.82 01/22/2015 1559   CALCIUM 8.8 (L) 12/29/2019 0853   CALCIUM 9.3 01/08/2015 0850   PROT 7.1 12/29/2019 0853   PROT 6.4 03/04/2017 0918   PROT 6.8 01/22/2015 1559   ALBUMIN 3.5 12/29/2019 0853   ALBUMIN 2.5 (L) 03/04/2017 0918   ALBUMIN 3.9 01/22/2015 1559   AST 41 12/29/2019 0853   AST 34 01/22/2015 1559   ALT 28 12/29/2019 0853   ALT 42 01/22/2015 1559   ALKPHOS 179 (H) 12/29/2019 0853   ALKPHOS 157 (H) 01/22/2015 1559   BILITOT 0.9 12/29/2019 0853   BILITOT 6.2 (H) 03/04/2017 0918   BILITOT 0.2 (L) 01/22/2015 1559   GFRNONAA >60 12/29/2019 0853   GFRNONAA >60 01/22/2015 1559   GFRAA >60 12/29/2019 0853   GFRAA >60 01/22/2015 1559    No results found for: SPEP, UPEP  Lab Results  Component Value Date   WBC 2.9 (L) 12/29/2019   NEUTROABS 1.9 12/29/2019   HGB 13.1 12/29/2019   HCT 41.1 12/29/2019   MCV 99.0 12/29/2019   PLT 174 12/29/2019      Chemistry      Component Value Date/Time   NA 141 12/29/2019 0853   NA 135 03/04/2017 0918   NA 135 01/08/2015 0850   K 3.5 12/29/2019 0853   K 3.7 01/08/2015 0850   CL 104  12/29/2019 0853   CL 101 01/08/2015 0850   CO2 27 12/29/2019 0853   CO2 26 01/08/2015 0850   BUN 8 12/29/2019 0853   BUN 8 03/04/2017 0918   BUN 17 01/08/2015 0850   CREATININE 0.86 12/29/2019 0853   CREATININE 0.82 01/22/2015 1559      Component Value Date/Time   CALCIUM 8.8 (L) 12/29/2019 0853   CALCIUM 9.3 01/08/2015 0850   ALKPHOS 179 (H) 12/29/2019 0853   ALKPHOS 157 (H) 01/22/2015 1559   AST 41 12/29/2019 0853   AST 34 01/22/2015 1559   ALT 28 12/29/2019 0853   ALT 42 01/22/2015 1559   BILITOT 0.9 12/29/2019 0853   BILITOT 6.2 (H)  03/04/2017 0918   BILITOT 0.2 (L) 01/22/2015 1559       RADIOGRAPHIC STUDIES: I have personally reviewed the radiological images as listed and agreed with the findings in the report. No results found.   ASSESSMENT & PLAN:  Carcinoma of upper-outer quadrant of left breast in female, estrogen receptor positive ([place]) # Recurrent metastatic breast cancer-lung/liver metastases ER PR positive HER-2 negative; CT scan March second 2021-partial response noted-improved liver lesions; lung lesion.  No new lesions.  Discontinue Adriamycin because of drop in ejection fraction.   # proceed with Gemcitabine; Labs today reviewed;  acceptable for treatment today.  Again reviewed the potential side effects including but not limited to fever, skin rash drop in blood counts.  #Dizzy spells/visual disturbance--  Brain MRi-Stable; s/p neurology evaluation.  Overall stable.  Patient awaiting EEG/low clinical suspicion of seizure.  Left a message for Dr. Brigitte Pulse to discuss further.  # cardiomyopathy/ Sinus tachycardia- on metoprolol-/Tamabcor; [march 2d-echo- 40-45%]; STABLE>    # Elevated LFTs-likely secondary to progressive malignancy;STABLE.  # Peripheral neuropathy grade 1-2-  on Lyrica; STABLE.   # Right lower extremity DVT bilateral-November 28, 2019--STABLE.  # DISPOSITION:  # gemcitabine today # 1 week- MD; labs- cbc/cmp;Dr.B     No orders of the defined types were placed in this encounter.  All questions were answered. The patient knows to call the clinic with any problems, questions or concerns.     Cammie Sickle, MD 12/29/2019 10:14 AM

## 2019-12-29 NOTE — Assessment & Plan Note (Addendum)
#  Recurrent metastatic breast cancer-lung/liver metastases ER PR positive HER-2 negative; CT scan March second 2021-partial response noted-improved liver lesions; lung lesion.  No new lesions.  Discontinue Adriamycin because of drop in ejection fraction.   # proceed with Gemcitabine; Labs today reviewed;  acceptable for treatment today.  Again reviewed the potential side effects including but not limited to fever, skin rash drop in blood counts.  #Dizzy spells/visual disturbance--  Brain MRi-Stable; s/p neurology evaluation.  Overall stable.  Patient awaiting EEG/low clinical suspicion of seizure.  Left a message for Dr. Brigitte Pulse to discuss further.  # cardiomyopathy/ Sinus tachycardia- on metoprolol-/Tamabcor; [march 2d-echo- 40-45%]; STABLE>    # Elevated LFTs-likely secondary to progressive malignancy;STABLE.  # Peripheral neuropathy grade 1-2-  on Lyrica; STABLE.   # Right lower extremity DVT bilateral-November 28, 2019--STABLE.  # DISPOSITION:  # gemcitabine today # 1 week- MD; labs- cbc/cmp;Dr.B

## 2019-12-30 ENCOUNTER — Ambulatory Visit: Payer: Medicare HMO

## 2020-01-01 ENCOUNTER — Other Ambulatory Visit: Payer: Self-pay | Admitting: *Deleted

## 2020-01-01 ENCOUNTER — Telehealth: Payer: Self-pay | Admitting: Internal Medicine

## 2020-01-01 MED ORDER — APIXABAN 5 MG PO TABS: 5.0000 mg | ORAL_TABLET | Freq: Two times a day (BID) | ORAL | 4 refills | Status: DC

## 2020-01-01 NOTE — Telephone Encounter (Signed)
Spoke to Dr.Shah; neurology-nystagmus resolved.  Patient's neurological episodes-question seizures versus others.  Currently awaiting EEG.   Patient broad-based gait instability-likely secondary to underlying malignancy/radiation aftereffects.

## 2020-01-02 ENCOUNTER — Telehealth: Payer: Self-pay | Admitting: *Deleted

## 2020-01-02 DIAGNOSIS — T451X5D Adverse effect of antineoplastic and immunosuppressive drugs, subsequent encounter: Secondary | ICD-10-CM | POA: Diagnosis not present

## 2020-01-02 DIAGNOSIS — C787 Secondary malignant neoplasm of liver and intrahepatic bile duct: Secondary | ICD-10-CM | POA: Diagnosis not present

## 2020-01-02 DIAGNOSIS — C50412 Malignant neoplasm of upper-outer quadrant of left female breast: Secondary | ICD-10-CM | POA: Diagnosis not present

## 2020-01-02 DIAGNOSIS — I82401 Acute embolism and thrombosis of unspecified deep veins of right lower extremity: Secondary | ICD-10-CM | POA: Diagnosis not present

## 2020-01-02 DIAGNOSIS — G620 Drug-induced polyneuropathy: Secondary | ICD-10-CM | POA: Diagnosis not present

## 2020-01-02 DIAGNOSIS — C182 Malignant neoplasm of ascending colon: Secondary | ICD-10-CM | POA: Diagnosis not present

## 2020-01-02 DIAGNOSIS — D702 Other drug-induced agranulocytosis: Secondary | ICD-10-CM | POA: Diagnosis not present

## 2020-01-02 DIAGNOSIS — I10 Essential (primary) hypertension: Secondary | ICD-10-CM | POA: Diagnosis not present

## 2020-01-02 DIAGNOSIS — C7931 Secondary malignant neoplasm of brain: Secondary | ICD-10-CM | POA: Diagnosis not present

## 2020-01-02 MED ORDER — APIXABAN 5 MG PO TABS: 5.0000 mg | ORAL_TABLET | Freq: Two times a day (BID) | ORAL | 4 refills | Status: DC

## 2020-01-02 NOTE — Telephone Encounter (Signed)
Prescription signed yesterday was done as a no print

## 2020-01-04 DIAGNOSIS — I10 Essential (primary) hypertension: Secondary | ICD-10-CM | POA: Diagnosis not present

## 2020-01-04 DIAGNOSIS — C50412 Malignant neoplasm of upper-outer quadrant of left female breast: Secondary | ICD-10-CM | POA: Diagnosis not present

## 2020-01-04 DIAGNOSIS — T451X5D Adverse effect of antineoplastic and immunosuppressive drugs, subsequent encounter: Secondary | ICD-10-CM | POA: Diagnosis not present

## 2020-01-04 DIAGNOSIS — C182 Malignant neoplasm of ascending colon: Secondary | ICD-10-CM | POA: Diagnosis not present

## 2020-01-04 DIAGNOSIS — I82401 Acute embolism and thrombosis of unspecified deep veins of right lower extremity: Secondary | ICD-10-CM | POA: Diagnosis not present

## 2020-01-04 DIAGNOSIS — D702 Other drug-induced agranulocytosis: Secondary | ICD-10-CM | POA: Diagnosis not present

## 2020-01-04 DIAGNOSIS — C787 Secondary malignant neoplasm of liver and intrahepatic bile duct: Secondary | ICD-10-CM | POA: Diagnosis not present

## 2020-01-04 DIAGNOSIS — G620 Drug-induced polyneuropathy: Secondary | ICD-10-CM | POA: Diagnosis not present

## 2020-01-04 DIAGNOSIS — C7931 Secondary malignant neoplasm of brain: Secondary | ICD-10-CM | POA: Diagnosis not present

## 2020-01-05 ENCOUNTER — Encounter: Payer: Self-pay | Admitting: Internal Medicine

## 2020-01-05 ENCOUNTER — Inpatient Hospital Stay: Payer: Medicare HMO

## 2020-01-05 ENCOUNTER — Other Ambulatory Visit: Payer: Self-pay

## 2020-01-05 ENCOUNTER — Inpatient Hospital Stay (HOSPITAL_BASED_OUTPATIENT_CLINIC_OR_DEPARTMENT_OTHER): Payer: Medicare HMO | Admitting: Internal Medicine

## 2020-01-05 DIAGNOSIS — E785 Hyperlipidemia, unspecified: Secondary | ICD-10-CM | POA: Diagnosis not present

## 2020-01-05 DIAGNOSIS — Z170 Estrogen receptor positive status [ER+]: Secondary | ICD-10-CM

## 2020-01-05 DIAGNOSIS — Z5189 Encounter for other specified aftercare: Secondary | ICD-10-CM | POA: Diagnosis not present

## 2020-01-05 DIAGNOSIS — C50412 Malignant neoplasm of upper-outer quadrant of left female breast: Secondary | ICD-10-CM | POA: Diagnosis not present

## 2020-01-05 DIAGNOSIS — C7800 Secondary malignant neoplasm of unspecified lung: Secondary | ICD-10-CM | POA: Diagnosis not present

## 2020-01-05 DIAGNOSIS — C7931 Secondary malignant neoplasm of brain: Secondary | ICD-10-CM | POA: Diagnosis not present

## 2020-01-05 DIAGNOSIS — D701 Agranulocytosis secondary to cancer chemotherapy: Secondary | ICD-10-CM | POA: Diagnosis not present

## 2020-01-05 DIAGNOSIS — Z5111 Encounter for antineoplastic chemotherapy: Secondary | ICD-10-CM | POA: Diagnosis not present

## 2020-01-05 DIAGNOSIS — C787 Secondary malignant neoplasm of liver and intrahepatic bile duct: Secondary | ICD-10-CM | POA: Diagnosis not present

## 2020-01-05 LAB — COMPREHENSIVE METABOLIC PANEL
ALT: 41 U/L (ref 0–44)
AST: 59 U/L — ABNORMAL HIGH (ref 15–41)
Albumin: 3.4 g/dL — ABNORMAL LOW (ref 3.5–5.0)
Alkaline Phosphatase: 230 U/L — ABNORMAL HIGH (ref 38–126)
Anion gap: 11 (ref 5–15)
BUN: 9 mg/dL (ref 8–23)
CO2: 26 mmol/L (ref 22–32)
Calcium: 8.9 mg/dL (ref 8.9–10.3)
Chloride: 104 mmol/L (ref 98–111)
Creatinine, Ser: 0.81 mg/dL (ref 0.44–1.00)
GFR calc Af Amer: >60 mL/min (ref >60)
GFR calc non Af Amer: >60 mL/min (ref >60)
Glucose, Bld: 91 mg/dL (ref 70–99)
Potassium: 3.4 mmol/L — ABNORMAL LOW (ref 3.5–5.1)
Sodium: 141 mmol/L (ref 135–145)
Total Bilirubin: 0.8 mg/dL (ref 0.3–1.2)
Total Protein: 7.1 g/dL (ref 6.5–8.1)

## 2020-01-05 LAB — CBC WITH DIFFERENTIAL/PLATELET
Abs Immature Granulocytes: 0.01 10*3/uL (ref 0.00–0.07)
Basophils Absolute: 0 10*3/uL (ref 0.0–0.1)
Basophils Relative: 0 %
Eosinophils Absolute: 0 10*3/uL (ref 0.0–0.5)
Eosinophils Relative: 1 %
HCT: 39.4 % (ref 36.0–46.0)
Hemoglobin: 13 g/dL (ref 12.0–15.0)
Immature Granulocytes: 1 %
Lymphocytes Relative: 28 %
Lymphs Abs: 0.5 10*3/uL — ABNORMAL LOW (ref 0.7–4.0)
MCH: 31.9 pg (ref 26.0–34.0)
MCHC: 33 g/dL (ref 30.0–36.0)
MCV: 96.6 fL (ref 80.0–100.0)
Monocytes Absolute: 0.3 10*3/uL (ref 0.1–1.0)
Monocytes Relative: 17 %
Neutro Abs: 0.9 10*3/uL — ABNORMAL LOW (ref 1.7–7.7)
Neutrophils Relative %: 53 %
Platelets: 85 10*3/uL — ABNORMAL LOW (ref 150–400)
RBC: 4.08 MIL/uL (ref 3.87–5.11)
RDW: 16.9 % — ABNORMAL HIGH (ref 11.5–15.5)
Smear Review: DECREASED
WBC: 1.7 10*3/uL — ABNORMAL LOW (ref 4.0–10.5)
nRBC: 1.7 % — ABNORMAL HIGH (ref 0.0–0.2)

## 2020-01-05 NOTE — Progress Notes (Signed)
Patient admits to falling twice this week. She has difficulty getting out of there recliner at her home. She tried to get in her recliner and she missed judge the area while sitting. She also states that her "legs gave out while standing at the kitchen sink." she states that she does not have any bruising from the fall. She did not have any head injuries from falls.

## 2020-01-05 NOTE — Progress Notes (Signed)
[facility] OFFICE PROGRESS NOTE  Patient Care Team: Delsa Grana, PA-C as PCP - General (Family Medicine) Wellington Hampshire, MD as PCP - Cardiology (Cardiology) Bary Castilla Forest Gleason, MD (General Surgery) Vladimir Crofts, MD as Consulting Physician (Neurology)  Cancer Staging No matching staging information was found for the patient.   Oncology History Overview Note  # LEFT BREAST IDC; STAGE II [cT2N1] ER >90%; PR- 50-90%; her 2 NEU- POS; s/p Neoadj chemo; AUG 2016- TCH+P s/p Lumpec & partial ALND- path CR; s/p RT [finished Nov 2016];  adj Herceptin; HELD for Jan 19th 2017 [in DEC 2016-EF dropped from 63 to 51%; FEB 27th EF-42.9%]; JAN 2016 START Arimidex; May 2017- EF- 60%; May 24th 2017-Re-start Herceptin q 3W; July 28th STOP herceptin [finished 39m/m2; sec to Low EF; however 2017 Oct EF= 67%; improved]  # DEC 4th 2017- Start Neratinib 4 pills; DEC 11th 3 pills; STOPPED.  # FEB 2018- RECURRENCE ER/PR positive; Her 2 NEGATIVE [liver Bx]  # MARCH 1st 2018- Tax-Cytoxan [poor tol]  # FEB 2018- Brain mets [SBRT; Dr.Crystal; finished Feb 28th 2018]  # March 2018- Eribulin- poor tolerance  # June 8th 2018- Started Faslodex + Abema [abema-multiple interruptions sec to neutropenia]  # MRI Brain- March 2019- Leptomeningeal mets [WBRT; No LP s/p WBRT- 01/09/2018]; STOP Abema [544mday-sec to severe cytopenia]+faslodex  # MAY 20tth 019- Start Xeloda; STOPPED in Nov 22nd 2019- sec to Progressive liver lesions on CT scan  # NOV 26th 2019- Faslodex + Piqaray 25015m; Jan 2020- Piqray 300 mg+ faslodex; FEB 17th CT scan- mixed response/ Overall progression; STOP Piqray + faslodex.   # FEB 25th 2020- ERIBULIN; Poor tolerance sec to severe neutropenia.  Stopped May 2020.  CT scan May 2020-new/progressive 2 cm liver lesion;   #July nd 2020-Ixempra x5 cycles-; NOV 2020-MRI liver- worsening liver lesions [rising AST/ALT].   # NOV [id number] weekly [consent-]; December  31-2020-progression/rising LFTs.  Stop Taxol  #January 15th, 2021-Adriamycin weekly [C]; MARCH 2021- Partial response. STOP ADRIAMYCIN sec to EF drop 40-45%.   # April 2nd-2021-gemcitabine   #May 06, 2019-MRI brain 5 new lesions [3 mm to 10 mm]-asymptomatic; [previous whole brain radiation]- OCT 2020- Re-Irradiation.   # PN- G-2; may 2017- Cymbalta 84m18mMARCH 2018-  acute vascular insuff of BIL LE [? Taxotere vasoconstriction on asprin]; # hemorragic shock LGIB [s/p colo; Dr.Wohl]  #  SVT- on flecainide.   # April 2016- Liver Bx- NEG;   # Drop in EF from Herceptin [recovered OCt 27th- EF 65%]; March 2020 ejection fraction 40 to 45%.  Stop Adriamycin.  # BMD- jan 2017- osteopenia  # 2020- SEP-s/p Pallaitive care evaluation  -----------------------------------------------------------     MOLEHorace- Sep 4th 2018- PDL-1 0%/NEG for BRCA; MSI-Stable; Positive for PI3K; ccnd-1; FGF** -----------------------------------------------------------  Dx: Metastatic Breast cancer [ER/PR-Pos; her -2 NEG] Stage IV; goals: palliative Current treatment-gemcitabine chemotherapy [C]   Carcinoma of upper-outer quadrant of left breast in female, estrogen receptor positive (HCC)[place]/25/2020 - 01/17/2019 Chemotherapy   The patient had eriBULin mesylate (HALAVEN) 2.1 mg in sodium chloride 0.9 % 100 mL chemo infusion, 1.2 mg/m2 = 2.1 mg (100 % of original dose 1.2 mg/m2), Intravenous,  Once, 3 of 4 cycles Dose modification: 1.2 mg/m2 (original dose 1.2 mg/m2, Cycle 1, Reason: Provider Judgment), 0.8 mg/m2 (original dose 1.2 mg/m2, Cycle 3, Reason: Provider Judgment) Administration: 2.1 mg (11/22/2018), 2.1 mg (11/29/2018), 2.1 mg (12/20/2018), 1.4 mg (01/17/2019)  for chemotherapy treatment.    03/30/2019 -  07/12/2019 Chemotherapy   The patient had pegfilgrastim (NEULASTA ONPRO KIT) injection 6 mg, 6 mg, Subcutaneous, Once, 5 of 8 cycles Administration: 6 mg (03/30/2019), 6 mg  (04/20/2019), 6 mg (05/11/2019), 6 mg (06/22/2019), 6 mg (06/01/2019) ixabepilone (IXEMPRA) 60 mg in lactated ringers 250 mL chemo infusion, 33.5 mg/m2 = 57 mg (100 % of original dose 32 mg/m2), Intravenous,  Once, 5 of 8 cycles Dose modification: 32 mg/m2 (original dose 32 mg/m2, Cycle 1, Reason: Provider Judgment) Administration: 72 mg (04/20/2019), 72 mg (05/11/2019), 72 mg (06/22/2019), 72 mg (06/01/2019)  for chemotherapy treatment.    08/18/2019 - 09/28/2019 Chemotherapy   The patient had PACLitaxel (TAXOL) 150 mg in sodium chloride 0.9 % 250 mL chemo infusion (</= 18m/m2), 80 mg/m2 = 150 mg, Intravenous,  Once, 1 of 4 cycles Administration: 150 mg (08/18/2019), 150 mg (09/08/2019), 150 mg (09/15/2019)  for chemotherapy treatment.    10/13/2019 - 11/30/2019 Chemotherapy   The patient had DOXOrubicin (ADRIAMYCIN) chemo injection 38 mg, 20 mg/m2 = 38 mg, Intravenous,  Once, 6 of 9 cycles Administration: 38 mg (10/13/2019), 38 mg (10/20/2019), 38 mg (10/27/2019), 38 mg (11/17/2019), 38 mg (11/24/2019), 38 mg (11/10/2019) palonosetron (ALOXI) injection 0.25 mg, 0.25 mg, Intravenous,  Once, 6 of 9 cycles Administration: 0.25 mg (10/13/2019), 0.25 mg (10/20/2019), 0.25 mg (10/27/2019), 0.25 mg (11/17/2019), 0.25 mg (11/24/2019), 0.25 mg (11/10/2019)  for chemotherapy treatment.    12/29/2019 -  Chemotherapy   The patient had pegfilgrastim (NEULASTA ONPRO KIT) injection 6 mg, 6 mg, Subcutaneous, Once, 0 of 3 cycles gemcitabine (GEMZAR) 1,444 mg in sodium chloride 0.9 % 250 mL chemo infusion, 800 mg/m2 = 1,444 mg, Intravenous,  Once, 1 of 4 cycles Administration: 1,444 mg (12/29/2019)  for chemotherapy treatment.      INTERVAL HISTORY:  Sarah GOLEBIEWSKI89y.[email or web address].  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  Patient is currently cycle number 1 day 1 of chemotherapy approximately 1 week ago.   Patient is here to proceed with day 8 chemotherapy  Patient denies any fever chills.  Denies any skin rash.  However she continues to feel weak; and when she stands for a while [like washing the dishes]-legs give out and she slouches to the ground.  A similar episode while getting out of the chair.  She did not hit her head.  She is currently working with physical therapy.  Denies any worsening vision problems.   Review of Systems  Constitutional: Positive for malaise/fatigue. Negative for chills, diaphoresis, fever and weight loss.  HENT: Negative for nosebleeds and sore throat.   Eyes: Negative for double vision.  Respiratory: Negative for cough, hemoptysis, sputum production, shortness of breath and wheezing.   Cardiovascular: Negative for chest pain, palpitations, orthopnea and leg swelling.  Gastrointestinal: Negative for abdominal pain, blood in stool, constipation, heartburn, melena, nausea and vomiting.  Genitourinary: Negative for dysuria, frequency and urgency.  Musculoskeletal: Positive for joint pain. Negative for back pain.  Skin: Negative for itching.  Neurological: Positive for dizziness and tingling. Negative for focal weakness, weakness and headaches.  Endo/Heme/Allergies: Does not bruise/bleed easily.  Psychiatric/Behavioral: Negative for depression. The patient is not nervous/anxious and does not have insomnia.     PAST MEDICAL HISTORY :  Past Medical History:  Diagnosis Date  . Chemotherapy-induced peripheral neuropathy ([place]   . Epistaxis    a. 11/2016 in setting of asa/plavix-->silver nitrate cauterization.  .Marland KitchenGI  bleed    a. 11/2016 Admission w/ GIB and hypovolemic shock req 3u PRBC's;  b. 11/2016 ECG: gastritis & nonbleeding peptic ulcer; c. 11/2016 Conlonoscopy: rectal and sigmoid colonic ulcers.  . Heart attack ([place])    a. 1998 Cath @ [facility]: reportedly no intervention required.  Marland Kitchen Herceptin-induced cardiomyopathy ([place])    a. In the setting of Herceptin Rx for  breast cancer (initiated 12/2014); b. 03/2015 MUGA EF 64%; b. 08/2015 MUGA: EF 51%; c. 10/2015 MUGA: EF 44%; d. 11/2015 Echo: EF 45-50%; e. 01/2016 MUGA: EF 60%; f. 06/2016 MUGA EF 65%; g. 10/2016 MUGA: EF 61%;  h. 12/2016 Echo: EF 55-60%, gr1 DD.  Marland Kitchen Hyperlipidemia   . Hypertension   . Neuropathy   . Possible PAD (peripheral artery disease) ([place])    a. 11/2016 LE cyanosis and weak pulses-->CTA w/o significant Ao-BiFem dzs. ? distal dzs-->ASA/Plavix initiated by vascular surgery.  Marland Kitchen PSVT (paroxysmal supraventricular tachycardia) ([place])    a. Dx 11/2016.  . Pulmonary embolism ([place])    a. 12/2016 CTA Chest: small nonocclusive PE in inferior segment of the Left lingula, somewhat eccentric filling defect suggesting chronic rather than acute embolic event; b. 11/8880 LE U/S:  No DVT; c. 12/2016 Echo: Nl RV fxn, nl PASP.  Marland Kitchen Recurrent Metastatic breast cancer ([place])    a. Dx 2016: Stage II, ER positive, PR positive, HER-2/neu overexpressing of the left breast-->chemo/radiation; b. 10/2016 CT Abd/pelvis: diffuse liver mets, ill defined sclerotic bone lesions-T12;  c. 10/2016 MRI brain: metastatic lesion along L temporal lobe (19x29m) w/ extensive surrounding edema & 533mmidline shift to right.  . Sepsis ([place]  . Sinus tachycardia     PAST SURGICAL HISTORY :   Past Surgical History:  Procedure Laterality Date  . BREAST BIOPSY Left 2016   Positive  . BREAST LUMPECTOMY WITH SENTINEL LYMPH NODE BIOPSY Left 05/23/2015   Procedure: LEFT BREAST WIDE EXCISION WITH AXILLARY DISSECTION, MASTOPLASTY ;  Surgeon: JeRobert BellowMD;  Location: [facility] ORS;  Service: General;  Laterality: Left;  . BREAST SURGERY Left 12/18/14   breast biopsy/INVASIVE DUCTAL CARCINOMA OF BREAST, NOTTINGHAM GRADE 2.  . Marland KitchenREAST SURGERY  05/23/2015.   Wide excision/mastoplasty, axillary dissection. No residual invasive cancer, positive for residual DCIS. 0/2 nodes identified on axillary dissection. (no SLN by technetium or methylene blue)  . CARDIAC  CATHETERIZATION    . COLONOSCOPY WITH PROPOFOL N/A 12/10/2016   Procedure: COLONOSCOPY WITH PROPOFOL;  Surgeon: DaLucilla LameMD;  Location: [facility] ENDOSCOPY;  Service: Endoscopy;  Laterality: N/A;  . COLONOSCOPY WITH PROPOFOL N/A 02/13/2017   Procedure: COLONOSCOPY WITH PROPOFOL;  Surgeon: WoLucilla LameMD;  [facility];  Service: Endoscopy;  Laterality: N/A;  . ESOPHAGOGASTRODUODENOSCOPY N/A 11/03/2019   Procedure: ESOPHAGOGASTRODUODENOSCOPY (EGD);  Surgeon: VaLin LandsmanMD;  [facility];  Service: Gastroenterology;  Laterality: N/A;  . ESOPHAGOGASTRODUODENOSCOPY (EGD) WITH PROPOFOL N/A 12/08/2016   Procedure: ESOPHAGOGASTRODUODENOSCOPY (EGD) WITH PROPOFOL;  Surgeon: DaLucilla LameMD;  Location: [facility] END[facility];  Service: Endoscopy;  Laterality: N/A;  . IVC FILTER INSERTION N/A 02/15/2017   Procedure: IVC Filter Insertion;  Surgeon: DeAlgernon HuxleyMD;  [facility];  Service: Cardiovascular;  Laterality: N/A;  . PORTACATH PLACEMENT Right 12-31-14   Dr ByBary Castilla  FAMILY HISTORY :   Family History  Problem Relation Age of Onset  . Breast cancer Maternal Aunt   . Breast cancer Cousin   . Brain cancer Maternal Uncle   . Diabetes Mother   .  Hypertension Mother   . Stroke Mother     SOCIAL HISTORY:   Social History   Tobacco Use  . Smoking status: Light Tobacco Smoker    Packs/day: 0.50    Years: 18.00    Pack years: 9.00    Types: Cigarettes  . Smokeless tobacco: Never Used  Substance Use Topics  . Alcohol use: No    Alcohol/week: 0.0 standard drinks  . Drug use: No    ALLERGIES:  is allergic to no known allergies.  MEDICATIONS:  Current Outpatient Medications  Medication Sig Dispense Refill  . acetaminophen (TYLENOL) 650 MG CR tablet Take 650 mg by mouth every 8 (eight) hours as needed for pain.    Marland Kitchen apixaban (ELIQUIS) 5 MG TABS tablet Take 1 tablet (5 mg total) by mouth 2 (two) times daily. 60 tablet 4  . flecainide (TAMBOCOR) 50  MG tablet Take 1 tablet (50 mg total) by mouth 2 (two) times daily. 60 tablet 4  . ipratropium (ATROVENT) 0.03 % nasal spray Place 2 sprays into both nostrils every 12 (twelve) hours. 30 mL 12  . levocetirizine (XYZAL) 5 MG tablet Take 1 tablet (5 mg total) by mouth every evening. 30 tablet 2  . magnesium oxide (MAG-OX) 400 MG tablet Take 1 tablet (400 mg total) by mouth 2 (two) times daily. 60 tablet 5  . meclizine (ANTIVERT) 25 MG tablet Take 1 tablet (25 mg total) by mouth 3 (three) times daily as needed for dizziness. 30 tablet 0  . metoprolol tartrate (LOPRESSOR) 25 MG tablet Take 1 tablet (25 mg total) by mouth 2 (two) times daily. 60 tablet 1  . montelukast (SINGULAIR) 10 MG tablet Take 1 tablet (10 mg total) by mouth at bedtime. 60 tablet 3  . ondansetron (ZOFRAN) 8 MG tablet One pill every 8 hours as needed for nausea/vomitting. 40 tablet 1  . pregabalin (LYRICA) 100 MG capsule Take 1 capsule (100 mg total) by mouth 2 (two) times daily. 60 capsule 2  . prochlorperazine (COMPAZINE) 10 MG tablet Take 1 tablet (10 mg total) by mouth every 6 (six) hours as needed for nausea or vomiting. 40 tablet 1  . Vitamin D, Ergocalciferol, (DRISDOL) 1.25 MG (50000 UNIT) CAPS capsule TAKE 1 CAPSULE (50,000 UNITS TOTAL) ONCE A WEEK BY MOUTH. 12 capsule 1   No current facility-administered medications for this visit.   Facility-Administered Medications Ordered in Other Visits  Medication Dose Route Frequency Provider Last Rate Last Admin  . 0.9 %  sodium chloride infusion   Intravenous Continuous [name], MD   New Bag at 12/10/16 1140  . 0.9 %  sodium chloride infusion   Intravenous Continuous Lloyd Huger, MD 999 mL/hr at 04/24/15 1520 New Bag at 05/23/15 0951  . 0.9 %  sodium chloride infusion   Intravenous Continuous Verlon Au, NP 999 mL/hr at 10/31/19 1522 New Bag at 10/31/19 1522  . heparin lock flush 100 unit/mL  500 Units Intravenous Once Berenzon, Dmitriy, MD      . sodium  chloride 0.9 % injection 10 mL  10 mL Intravenous PRN [name], MD   10 mL at 04/04/15 1440  . sodium chloride flush (NS) 0.9 % injection 10 mL  10 mL Intravenous PRN Berenzon, Dmitriy, MD        PHYSICAL EXAMINATION: ECOG PERFORMANCE STATUS: 1 - Symptomatic but completely ambulatory  BP (!) 143/100 (BP Location: Right Arm, Patient Position: Sitting)   Pulse (!) 109   Temp 97.9 F (36.6 C) (  Tympanic)   Resp 18   Wt 146 lb (66.2 kg)   SpO2 98%   BMI 23.57 kg/m   Filed Weights   01/05/20 0955  Weight: 146 lb (66.2 kg)    Physical Exam  Constitutional: She is oriented to person, place, and time and well-developed, well-nourished, and in no distress.  Patient is alone.  In wheel chair.   HENT:  Head: Normocephalic and atraumatic.  Mouth/Throat: Oropharynx is clear and moist. No oropharyngeal exudate.  Eyes: Pupils are equal, round, and reactive to light.  Cardiovascular: Normal rate and regular rhythm.  Pulmonary/Chest: Effort normal and breath sounds normal. No respiratory distress. She has no wheezes.  Abdominal: Soft. Bowel sounds are normal. She exhibits no distension and no mass. There is no abdominal tenderness. There is no rebound and no guarding.  Musculoskeletal:        General: No tenderness. Normal range of motion.     Cervical back: Normal range of motion and neck supple.  Neurological: She is alert and oriented to person, place, and time.  Broad based gait.  Unsteady.  Skin: Skin is warm.  Psychiatric: Affect normal.    LABORATORY DATA:  I have reviewed the data as listed    Component Value Date/Time   NA 141 01/05/2020 0901   NA 135 03/04/2017 0918   NA 135 01/08/2015 0850   K 3.4 (L) 01/05/2020 0901   K 3.7 01/08/2015 0850   CL 104 01/05/2020 0901   CL 101 01/08/2015 0850   CO2 26 01/05/2020 0901   CO2 26 01/08/2015 0850   GLUCOSE 91 01/05/2020 0901   GLUCOSE 161 (H) 01/08/2015 0850   BUN 9 01/05/2020 0901   BUN 8 03/04/2017 0918   BUN 17  01/08/2015 0850   CREATININE 0.81 01/05/2020 0901   CREATININE 0.82 01/22/2015 1559   CALCIUM 8.9 01/05/2020 0901   CALCIUM 9.3 01/08/2015 0850   PROT 7.1 01/05/2020 0901   PROT 6.4 03/04/2017 0918   PROT 6.8 01/22/2015 1559   ALBUMIN 3.4 (L) 01/05/2020 0901   ALBUMIN 2.5 (L) 03/04/2017 0918   ALBUMIN 3.9 01/22/2015 1559   AST 59 (H) 01/05/2020 0901   AST 34 01/22/2015 1559   ALT 41 01/05/2020 0901   ALT 42 01/22/2015 1559   ALKPHOS 230 (H) 01/05/2020 0901   ALKPHOS 157 (H) 01/22/2015 1559   BILITOT 0.8 01/05/2020 0901   BILITOT 6.2 (H) 03/04/2017 0918   BILITOT 0.2 (L) 01/22/2015 1559   GFRNONAA >60 01/05/2020 0901   GFRNONAA >60 01/22/2015 1559   GFRAA >60 01/05/2020 0901   GFRAA >60 01/22/2015 1559    No results found for: SPEP, UPEP  Lab Results  Component Value Date   WBC 1.7 (L) 01/05/2020   NEUTROABS 0.9 (L) 01/05/2020   HGB 13.0 01/05/2020   HCT 39.4 01/05/2020   MCV 96.6 01/05/2020   PLT 85 (L) 01/05/2020      Chemistry      Component Value Date/Time   NA 141 01/05/2020 0901   NA 135 03/04/2017 0918   NA 135 01/08/2015 0850   K 3.4 (L) 01/05/2020 0901   K 3.7 01/08/2015 0850   CL 104 01/05/2020 0901   CL 101 01/08/2015 0850   CO2 26 01/05/2020 0901   CO2 26 01/08/2015 0850   BUN 9 01/05/2020 0901   BUN 8 03/04/2017 0918   BUN 17 01/08/2015 0850   CREATININE 0.81 01/05/2020 0901   CREATININE 0.82 01/22/2015 1559  Component Value Date/Time   CALCIUM 8.9 01/05/2020 0901   CALCIUM 9.3 01/08/2015 0850   ALKPHOS 230 (H) 01/05/2020 0901   ALKPHOS 157 (H) 01/22/2015 1559   AST 59 (H) 01/05/2020 0901   AST 34 01/22/2015 1559   ALT 41 01/05/2020 0901   ALT 42 01/22/2015 1559   BILITOT 0.8 01/05/2020 0901   BILITOT 6.2 (H) 03/04/2017 0918   BILITOT 0.2 (L) 01/22/2015 1559       RADIOGRAPHIC STUDIES: I have personally reviewed the radiological images as listed and agreed with the findings in the report. No results found.   ASSESSMENT &  PLAN:  Carcinoma of upper-outer quadrant of left breast in female, estrogen receptor positive ([place]) # Recurrent metastatic breast cancer-lung/liver metastases ER PR positive HER-2 negative; CT scan March second 2021-partial response noted-improved liver lesions; lung lesion.  No new lesions. Currently on gem.   # Gem cycle #1 day-8 today; HOLD chemo severe neutropenia-absolute neutrophil count 900; platelets 85.  Patient currently dose reduced; recommends chemotherapy every 2 weeks.  Also recommend on pro-given transportation problems.  #Gait instability/visual disturbance-s/p evaluation with Dr. Brigitte Pulse neurology.  Likely secondary to previous radiation/leptomeningeal disease.  Awaiting EEG.  Recommend walking with a walker.  Avoiding falls.  # cardiomyopathy/ Sinus tachycardia- on metoprolol-/Tamabcor; [march 2d-echo- 40-45%]; STABLE.   # Elevated LFTs-likely secondary to progressive malignancy;STABLE.  # Peripheral neuropathy grade 1-2-  on Lyrica; STABLE.   # Right lower extremity DVT bilateral-November 28, 2019--stable continue Eliquis.  # DISPOSITION:  # HOLD gemcitabine today;  # April 19th- MD; labs- cbc/cmp;GEM- onpro-Dr.B     No orders of the defined types were placed in this encounter.  All questions were answered. The patient knows to call the clinic with any problems, questions or concerns.     Cammie Sickle, MD 01/05/2020 10:57 AM

## 2020-01-05 NOTE — Assessment & Plan Note (Addendum)
#  Recurrent metastatic breast cancer-lung/liver metastases ER PR positive HER-2 negative; CT scan March second 2021-partial response noted-improved liver lesions; lung lesion.  No new lesions. Currently on gem.   # Gem cycle #1 day-8 today; HOLD chemo severe neutropenia-absolute neutrophil count 900; platelets 85.  Patient currently dose reduced; recommends chemotherapy every 2 weeks.  Also recommend on pro-given transportation problems.  #Gait instability/visual disturbance-s/p evaluation with Dr. Brigitte Pulse neurology.  Likely secondary to previous radiation/leptomeningeal disease.  Awaiting EEG.  Recommend walking with a walker.  Avoiding falls.  # cardiomyopathy/ Sinus tachycardia- on metoprolol-/Tamabcor; [march 2d-echo- 40-45%]; STABLE.   # Elevated LFTs-likely secondary to progressive malignancy;STABLE.  # Peripheral neuropathy grade 1-2-  on Lyrica; STABLE.   # Right lower extremity DVT bilateral-November 28, 2019--stable continue Eliquis.  # DISPOSITION:  # HOLD gemcitabine today;  # April 19th- MD; labs- cbc/cmp;GEM- onpro-Dr.B

## 2020-01-06 ENCOUNTER — Ambulatory Visit: Payer: Medicare HMO | Attending: Oncology

## 2020-01-06 DIAGNOSIS — Z23 Encounter for immunization: Secondary | ICD-10-CM

## 2020-01-06 NOTE — Progress Notes (Signed)
   Covid-19 Vaccination Clinic  Name:  Sarah Horne    MRN: [id number] DOB: [birth year]  01/06/2020  Sarah Horne was observed post Covid-19 immunization for 15 minutes without incident. She was provided with Vaccine Information Sheet and instruction to access the V-Safe system.   Sarah Horne was instructed to call 911 with any severe reactions post vaccine: Marland Kitchen Difficulty breathing  . Swelling of face and throat  . A fast heartbeat  . A bad rash all over body  . Dizziness and weakness   Immunizations Administered    Name Date Dose VIS Date Route   Pfizer COVID-19 Vaccine 01/06/2020 10:25 AM 0.3 mL 09/08/2019 Intramuscular   Manufacturer: [place]   Lot: [id number]   [place]: [id number]

## 2020-01-08 ENCOUNTER — Telehealth: Payer: Self-pay

## 2020-01-08 NOTE — Progress Notes (Signed)
Pharmacist Chemotherapy Monitoring - Follow Up Assessment    I verify that I have reviewed each item in the below checklist:  . Regimen for the patient is scheduled for the appropriate day and plan matches scheduled date. Marland Kitchen Appropriate non-routine labs are ordered dependent on drug ordered. . If applicable, additional medications reviewed and ordered per protocol based on lifetime cumulative doses and/or treatment regimen.   Plan for follow-up and/or issues identified: No . I-vent associated with next due treatment: No . MD and/or nursing notified: No  Stefana Lodico K 01/08/2020 8:31 AM

## 2020-01-08 NOTE — Telephone Encounter (Signed)
Nutrition Assessment   Reason for Assessment:  Referral from [facility] for weight loss   ASSESSMENT:  64 year old female with metastatic breast cancer with leptomeningeal disease to brain.  Patient receiving chemotherapy.  Past medical history of HLD, HTN, neuropathy, PE  Spoke with patient via phone for nutrition assessment.  Patient reports good appetite although recently had bought of thrush that effected appetite and had trouble swallowing.  Patient reports that she does not drive anymore and can't get to the store as often as she would like.  Depends on her brother to take her to the [employer] on Saturday to get groceries.  Reports typically would go to [employer] 3 times per week and get breakfast now eats cereal at home or cooks eggs and bacon.  Snacks on chips or cookies for lunch.  Supper she goes out to eat with brother sometimes but he works 3rd shift during the week.  Reports going to [facility] on Saturday night and eating hamburger.  Yesterday patient cooked beef, potatoes, macaroni and cheese, green beans, potatoes.   Denies any nutrition impact symptoms at this time.   Noted recent falls and working with PT   Medications: zofran, compazine   Labs: reviewed   Anthropometrics:   Height: 66 inches Weight: 146 lb on 4/9 160 lb on 1/28 (trending down)  BMI: 23  9% weight loss in 3 months   Estimated Energy Needs  Kcals: 1980-2300 Protein: 99-115 g Fluid: 1.9 L   NUTRITION DIAGNOSIS: Inadequate oral intake related to cancer related treatment side effects (weakness, falls, unable to drive) as evidenced by 9% weight loss, decreased intake   INTERVENTION:  Discussed easy to prepare foods high in calories and protein (soups). Does not like frozen dinners. Encouraged oral nutrition supplements.  Patient reports will try and get some at next trip to grocery store Discussed foods high in calories and protein to have on hand to eat (canned beans, nuts, peanut butter, canned  meats, etc) Provided patient with contact information   MONITORING, EVALUATION, GOAL: Patient will consume adequate calories and protein to prevent weight loss   Next Visit: phone f/u May 10  Asier Desroches B. Zenia Resides, [place], [place] Registered Dietitian [phone] (pager)

## 2020-01-09 ENCOUNTER — Telehealth: Payer: Self-pay | Admitting: Nurse Practitioner

## 2020-01-09 NOTE — Telephone Encounter (Signed)
Spoke with patient regarding Palliative services and she was in agreement with this.  I have scheduled a Telephone Consult for 01/30/20 @ 9:30 AM

## 2020-01-11 ENCOUNTER — Other Ambulatory Visit: Payer: Self-pay

## 2020-01-11 ENCOUNTER — Ambulatory Visit (INDEPENDENT_AMBULATORY_CARE_PROVIDER_SITE_OTHER): Payer: Medicare HMO

## 2020-01-11 VITALS — BP 131/101 | Ht 66.0 in | Wt 146.0 lb

## 2020-01-11 DIAGNOSIS — I82401 Acute embolism and thrombosis of unspecified deep veins of right lower extremity: Secondary | ICD-10-CM | POA: Diagnosis not present

## 2020-01-11 DIAGNOSIS — T451X5D Adverse effect of antineoplastic and immunosuppressive drugs, subsequent encounter: Secondary | ICD-10-CM | POA: Diagnosis not present

## 2020-01-11 DIAGNOSIS — I10 Essential (primary) hypertension: Secondary | ICD-10-CM | POA: Diagnosis not present

## 2020-01-11 DIAGNOSIS — C50412 Malignant neoplasm of upper-outer quadrant of left female breast: Secondary | ICD-10-CM | POA: Diagnosis not present

## 2020-01-11 DIAGNOSIS — Z0000 Encounter for general adult medical examination without abnormal findings: Secondary | ICD-10-CM | POA: Diagnosis not present

## 2020-01-11 DIAGNOSIS — G620 Drug-induced polyneuropathy: Secondary | ICD-10-CM | POA: Diagnosis not present

## 2020-01-11 DIAGNOSIS — C7931 Secondary malignant neoplasm of brain: Secondary | ICD-10-CM | POA: Diagnosis not present

## 2020-01-11 DIAGNOSIS — C787 Secondary malignant neoplasm of liver and intrahepatic bile duct: Secondary | ICD-10-CM | POA: Diagnosis not present

## 2020-01-11 DIAGNOSIS — D702 Other drug-induced agranulocytosis: Secondary | ICD-10-CM | POA: Diagnosis not present

## 2020-01-11 DIAGNOSIS — C182 Malignant neoplasm of ascending colon: Secondary | ICD-10-CM | POA: Diagnosis not present

## 2020-01-11 NOTE — Progress Notes (Addendum)
Subjective:   Sarah Horne is a 64 y.o. female who presents for an Initial Medicare Annual Wellness Visit.  Virtual Visit via Telephone Note  I connected with Sarah Horne on 01/11/20 at 10:40 AM EDT by telephone and verified that I am speaking with the correct person using two identifiers.  Medicare Annual Wellness visit completed telephonically due to Covid-19 pandemic.   Location: Patient: home Provider: office   I discussed the limitations, risks, security and privacy concerns of performing an evaluation and management service by telephone and the availability of in person appointments. The patient expressed understanding and agreed to proceed.  Some vital signs may be absent or patient reported.   Clemetine Marker, LPN    Review of Systems      Cardiac Risk Factors include: advanced age (>17mn, >>49women);smoking/ tobacco exposure;hypertension     Objective:    Today's Vitals   01/11/20 1047 01/11/20 1048  BP: (!) 131/101   Weight: 146 lb (66.2 kg)   Height: 5' 6"  (1.676 m)   PainSc:  5    Body mass index is 23.57 kg/m.  Advanced Directives 01/11/2020 01/05/2020 12/28/2019 12/14/2019 11/30/2019 11/02/2019 11/01/2019  Does Patient Have a Medical Advance Directive? Yes Yes Yes Yes Yes Yes Yes  Type of Advance Directive Out of facility DNR (pink MOST or yellow form) Out of facility DNR (pink MOST or yellow form);[place] Healthcare Power of [place] will Living will  Does patient want to make changes to medical advance directive? No - Patient declined No - Patient declined No - Patient declined - No - Patient declined No - Patient declined No - Patient declined  Copy of [place] Chart? No - copy requested No - copy requested - - No - copy requested No - copy requested No - copy requested  Would patient like information on creating a medical  advance directive? No - Patient declined No - Patient declined No - Patient declined No - Patient declined No - Patient declined No - Patient declined No - Patient declined    Current Medications (verified) Outpatient Encounter Medications as of 01/11/2020  Medication Sig  . acetaminophen (TYLENOL) 650 MG CR tablet Take 650 mg by mouth every 8 (eight) hours as needed for pain.  .Marland Kitchenapixaban (ELIQUIS) 5 MG TABS tablet Take 1 tablet (5 mg total) by mouth 2 (two) times daily.  . flecainide (TAMBOCOR) 50 MG tablet Take 1 tablet (50 mg total) by mouth 2 (two) times daily.  .Marland Kitchenipratropium (ATROVENT) 0.03 % nasal spray Place 2 sprays into both nostrils every 12 (twelve) hours.  .Marland Kitchenlevocetirizine (XYZAL) 5 MG tablet Take 1 tablet (5 mg total) by mouth every evening.  . magnesium oxide (MAG-OX) 400 MG tablet Take 1 tablet (400 mg total) by mouth 2 (two) times daily.  . meclizine (ANTIVERT) 25 MG tablet Take 1 tablet (25 mg total) by mouth 3 (three) times daily as needed for dizziness.  . metoprolol tartrate (LOPRESSOR) 25 MG tablet Take 1 tablet (25 mg total) by mouth 2 (two) times daily.  . montelukast (SINGULAIR) 10 MG tablet Take 1 tablet (10 mg total) by mouth at bedtime.  . ondansetron (ZOFRAN) 8 MG tablet One pill every 8 hours as needed for nausea/vomitting.  . pregabalin (LYRICA) 100 MG capsule Take 1 capsule (100 mg total) by mouth 2 (two) times daily.  . prochlorperazine (COMPAZINE) 10 MG  tablet Take 1 tablet (10 mg total) by mouth every 6 (six) hours as needed for nausea or vomiting.  . Vitamin D, Ergocalciferol, (DRISDOL) 1.25 MG (50000 UNIT) CAPS capsule TAKE 1 CAPSULE (50,000 UNITS TOTAL) ONCE A WEEK BY MOUTH.   Facility-Administered Encounter Medications as of 01/11/2020  Medication  . 0.9 %  sodium chloride infusion  . 0.9 %  sodium chloride infusion  . 0.9 %  sodium chloride infusion  . heparin lock flush 100 unit/mL  . sodium chloride 0.9 % injection 10 mL  . sodium chloride flush (NS)  0.9 % injection 10 mL    Allergies (verified) No known allergies   History: Past Medical History:  Diagnosis Date  . Chemotherapy-induced peripheral neuropathy ([place])   . Epistaxis    a. 11/2016 in setting of asa/plavix-->silver nitrate cauterization.  . GI bleed    a. 11/2016 Admission w/ GIB and hypovolemic shock req 3u PRBC's;  b. 11/2016 ECG: gastritis & nonbleeding peptic ulcer; c. 11/2016 Conlonoscopy: rectal and sigmoid colonic ulcers.  . Heart attack ([place])    a. 1998 Cath @ [facility]: reportedly no intervention required.  Marland Kitchen Herceptin-induced cardiomyopathy ([place])    a. In the setting of Herceptin Rx for breast cancer (initiated 12/2014); b. 03/2015 MUGA EF 64%; b. 08/2015 MUGA: EF 51%; c. 10/2015 MUGA: EF 44%; d. 11/2015 Echo: EF 45-50%; e. 01/2016 MUGA: EF 60%; f. 06/2016 MUGA EF 65%; g. 10/2016 MUGA: EF 61%;  h. 12/2016 Echo: EF 55-60%, gr1 DD.  Marland Kitchen Hyperlipidemia   . Hypertension   . Neuropathy   . Possible PAD (peripheral artery disease) ([place])    a. 11/2016 LE cyanosis and weak pulses-->CTA w/o significant Ao-BiFem dzs. ? distal dzs-->ASA/Plavix initiated by vascular surgery.  Marland Kitchen PSVT (paroxysmal supraventricular tachycardia) ([place])    a. Dx 11/2016.  . Pulmonary embolism ([place])    a. 12/2016 CTA Chest: small nonocclusive PE in inferior segment of the Left lingula, somewhat eccentric filling defect suggesting chronic rather than acute embolic event; b. 09/7508 LE U/S:  No DVT; c. 12/2016 Echo: Nl RV fxn, nl PASP.  Marland Kitchen Recurrent Metastatic breast cancer ([place])    a. Dx 2016: Stage II, ER positive, PR positive, HER-2/neu overexpressing of the left breast-->chemo/radiation; b. 10/2016 CT Abd/pelvis: diffuse liver mets, ill defined sclerotic bone lesions-T12;  c. 10/2016 MRI brain: metastatic lesion along L temporal lobe (19x91m) w/ extensive surrounding edema & 578mmidline shift to right.  . [place]is [place]  . Sinus tachycardia    Past Surgical History:  Procedure Laterality Date  . BREAST BIOPSY Left 2016     Positive  . BREAST LUMPECTOMY WITH SENTINEL LYMPH NODE BIOPSY Left 05/23/2015   Procedure: LEFT BREAST WIDE EXCISION WITH AXILLARY DISSECTION, MASTOPLASTY ;  Surgeon: JeRobert BellowMD;  Location: [facility] ORS;  Service: General;  Laterality: Left;  . BREAST SURGERY Left 12/18/14   breast biopsy/INVASIVE DUCTAL CARCINOMA OF BREAST, NOTTINGHAM GRADE 2.  . Marland KitchenREAST SURGERY  05/23/2015.   Wide excision/mastoplasty, axillary dissection. No residual invasive cancer, positive for residual DCIS. 0/2 nodes identified on axillary dissection. (no SLN by technetium or methylene blue)  . CARDIAC CATHETERIZATION    . COLONOSCOPY WITH PROPOFOL N/A 12/10/2016   Procedure: COLONOSCOPY WITH PROPOFOL;  Surgeon: DaLucilla LameMD;  Location: [facility] ENDOSCOPY;  Service: Endoscopy;  Laterality: N/A;  . COLONOSCOPY WITH PROPOFOL N/A 02/13/2017   Procedure: COLONOSCOPY WITH PROPOFOL;  Surgeon: WoLucilla LameMD;  [facility];  Service: Endoscopy;  Laterality: N/A;  .  ESOPHAGOGASTRODUODENOSCOPY N/A 11/03/2019   Procedure: ESOPHAGOGASTRODUODENOSCOPY (EGD);  Surgeon: Lin Landsman, MD;  Location: [facility] ENDOSCOPY;  Service: Gastroenterology;  Laterality: N/A;  . ESOPHAGOGASTRODUODENOSCOPY (EGD) WITH PROPOFOL N/A 12/08/2016   Procedure: ESOPHAGOGASTRODUODENOSCOPY (EGD) WITH PROPOFOL;  Surgeon: Lucilla Lame, MD;  Location: [facility] ENDOSCOPY;  Service: Endoscopy;  Laterality: N/A;  . IVC FILTER INSERTION N/A 02/15/2017   Procedure: IVC Filter Insertion;  Surgeon: Algernon Huxley, MD;  Location: [place] CV LAB;  Service: Cardiovascular;  Laterality: N/A;  . PORTACATH PLACEMENT Right 12-31-14   Dr Bary Castilla   Family History  Problem Relation Age of Onset  . Breast cancer Maternal Aunt   . Breast cancer Cousin   . Brain cancer Maternal Uncle   . Diabetes Mother   . Hypertension Mother   . Stroke Mother    Social History   Socioeconomic History  . Marital status: Divorced    Spouse name: Not on file  . Number of  children: Not on file  . Years of education: Not on file  . Highest education level: Not on file  Occupational History  . Not on file  Tobacco Use  . Smoking status: Light Tobacco Smoker    Packs/day: 0.25    Years: 18.00    Pack years: 4.50    Types: Cigarettes  . Smokeless tobacco: Never Used  Substance and Sexual Activity  . Alcohol use: No    Alcohol/week: 0.0 standard drinks  . Drug use: No  . Sexual activity: Not on file  Other Topics Concern  . Not on file  Social History Narrative   Lives in [place] by herself.   Social Determinants of Health   Financial Resource Strain: Low Risk   . Difficulty of Paying Living Expenses: Not very hard  Food Insecurity: No Food Insecurity  . Worried About Charity fundraiser in the Last Year: Never true  . Ran Out of Food in the Last Year: Never true  Transportation Needs: No Transportation Needs  . Lack of Transportation (Medical): No  . Lack of Transportation (Non-Medical): No  Physical Activity: Insufficiently Active  . Days of Exercise per Week: 7 days  . Minutes of Exercise per Session: 20 min  Stress: No Stress Concern Present  . Feeling of Stress : Not at all  Social Connections: Unknown  . Frequency of Communication with Friends and Family: Patient refused  . Frequency of Social Gatherings with Friends and Family: Patient refused  . Attends Religious Services: Patient refused  . Active Member of Clubs or Organizations: Patient refused  . Attends Archivist Meetings: Patient refused  . Marital Status: Divorced    Tobacco Counseling Ready to quit: Not Answered Counseling given: Not Answered   Clinical Intake:  Pre-visit preparation completed: Yes  Pain : 0-10 Pain Score: 5  Pain Type: Chronic pain Pain Location: Foot Pain Orientation: Right, Left Pain Descriptors / Indicators: Aching Pain Onset: More than a month ago Pain Frequency: Constant     BMI - recorded: 23.57 Nutritional Status: BMI  of 19-24  Normal Nutritional Risks: None Diabetes: No  How often do you need to have someone help you when you read instructions, pamphlets, or other written materials from your doctor or pharmacy?: 1 - Never  Interpreter Needed?: No  Information entered by :: Clemetine Marker LPN   Activities of Daily Living In your present state of health, do you have any difficulty performing the following activities: 01/11/2020 11/01/2019  Hearing? N N  Comment declines  hearing aids -  Vision? Y N  Comment has cataracts that need to be removed -  Difficulty concentrating or making decisions? N N  Walking or climbing stairs? Y Y  Dressing or bathing? N N  Doing errands, shopping? N N  Preparing Food and eating ? N -  Using the Toilet? N -  In the past six months, have you accidently leaked urine? Y -  Do you have problems with loss of bowel control? N -  Managing your Medications? N -  Managing your Finances? N -  Housekeeping or managing your Housekeeping? N -  Some recent data might be hidden     Immunizations and Health Maintenance Immunization History  Administered Date(s) Administered  . PFIZER SARS-COV-2 Vaccination 01/06/2020  . Tdap 01/18/2018   There are no preventive care reminders to display for this patient.  Patient Care Team: Delsa Grana, PA-C as PCP - General (Family Medicine) Wellington Hampshire, MD as PCP - Cardiology (Cardiology) Bary Castilla Forest Gleason, MD (General Surgery) Vladimir Crofts, MD as Consulting Physician (Neurology)  Indicate any recent Medical Services you may have received from other than [facility] providers in the past year (date may be approximate).     Assessment:   This is a routine wellness examination for Sarah Horne.  Hearing/Vision screen  Hearing Screening   125Hz  250Hz  500Hz  1000Hz  2000Hz  3000Hz  4000Hz  6000Hz  8000Hz   Right ear:           Left ear:           Comments: Pt denies hearing difficulty   Vision Screening Comments: Annual vision screenings  done at [place] issues and exercise activities discussed: Current Exercise Habits: Home exercise routine, Type of exercise: calisthenics;stretching, Time (Minutes): 20, Frequency (Times/Week): 7, Weekly Exercise (Minutes/Week): 140, Intensity: Mild, Exercise limited by: neurologic condition(s);orthopedic condition(s)  Goals    . Patient Stated     Pt states she would like to get through breast cancer treatments.       Depression Screen PHQ 2/9 Scores 01/11/2020 10/26/2019 11/12/2017 02/24/2017 01/18/2017 05/14/2016 05/07/2016  PHQ - 2 Score 0 0 0 0 0 0 0  PHQ- 9 Score - 1 - - - - -    Fall Risk Fall Risk  01/11/2020 10/27/2019 10/26/2019 11/12/2017 02/24/2017  Falls in the past year? 1 1 1  No Yes  Number falls in past yr: 1 1 1  - 2 or more  Injury with Fall? 0 0 0 - No  Risk for fall due to : History of fall(s) History of fall(s) - - -  Follow up Falls prevention discussed Falls evaluation completed - - -    FALL RISK PREVENTION PERTAINING TO THE HOME:  Any stairs in or around the home? Yes  If so, do they handrails? Yes   Home free of loose throw rugs in walkways, pet beds, electrical cords, etc? Yes  Adequate lighting in your home to reduce risk of falls? Yes   ASSISTIVE DEVICES UTILIZED TO PREVENT FALLS:  Life alert? No  Use of a cane, walker or w/c? No  Grab bars in the bathroom? Yes  Shower chair or bench in shower? No  Elevated toilet seat or a handicapped toilet? Yes   DME ORDERS:  DME order needed?  Yes - patient needs a new lift chair recliner due to peripheral neuropathy and difficulty getting up and down. Advised patient will need rx for Medicare to assist in paying for. Pt to discuss at next visit  with PCP on 01/24/20.   TIMED UP AND GO:  Was the test performed? No . Telephonic visit.   Education: Fall risk prevention has been discussed.  Intervention(s) required? No   Cognitive Function:     6CIT Screen 01/11/2020  What Year? 0 points  What  month? 0 points  What time? 0 points  Count back from 20 2 points  Months in reverse 0 points  Repeat phrase 8 points  Total Score 10    Screening Tests Health Maintenance  Topic Date Due  . MAMMOGRAM  10/25/2020 (Originally 01/28/2018)  . PNA vac Low Risk Adult (1 of 2 - PCV13) 10/25/2020 (Originally 01/06/2019)  . INFLUENZA VACCINE  04/28/2020  . COLONOSCOPY  02/14/2027  . TETANUS/TDAP  01/19/2028  . DEXA SCAN  Completed  . Hepatitis C Screening  Completed    Qualifies for Shingles Vaccine? No - currently undergoing chemo .  Tdap: Up to date  Flu Vaccine: Due for Flu vaccine. Does the patient want to receive this vaccine today?  No . Education has been provided regarding the importance of this vaccine but still declined. Advised may receive this vaccine at local pharmacy or Health Dept. Aware to provide a copy of the vaccination record if obtained from local pharmacy or Health Dept. Verbalized acceptance and understanding.  Pneumococcal Vaccine: Due for Pneumococcal vaccine. Does the patient want to receive this vaccine today?  No . Education has been provided regarding the importance of this vaccine but still declined. Advised may receive this vaccine at local pharmacy or Health Dept. Aware to provide a copy of the vaccination record if obtained from local pharmacy or Health Dept. Verbalized acceptance and understanding.   Cancer Screenings:  Colorectal Screening: Completed 02/13/17. Repeat every 10 years;   Mammogram: Completed 2017. Postponed during breast cancer treatment.   Bone Density: Completed 10/15/15. Results reflect  OSTEOPENIA. Repeat every 2 years. Pt requests to postpone at this time.   Lung Cancer Screening: (Low Dose CT Chest recommended if Age 65-80 years, 30 pack-year currently smoking OR have quit w/in 15years.) does not qualify.   Additional Screening:  Hepatitis C Screening: does qualify; Completed 08/10/19  Vision Screening: Recommended annual  ophthalmology exams for early detection of glaucoma and other disorders of the eye. Is the patient up to date with their annual eye exam?  Yes  Who is the provider or what is the name of the office in which the pt attends annual eye exams? [facility]  Dental Screening: Recommended annual dental exams for proper oral hygiene  Community Resource Referral:  CRR required this visit?  No      Plan:    I have personally reviewed and addressed the Medicare Annual Wellness questionnaire and have noted the following in the patient's chart:  A. Medical and social history B. Use of alcohol, tobacco or illicit drugs  C. Current medications and supplements D. Functional ability and status E.  Nutritional status F.  Physical activity G. Advance directives H. List of other physicians I.  Hospitalizations, surgeries, and ER visits in previous 12 months J.  [place] such as hearing and vision if needed, cognitive and depression L. Referrals and appointments   In addition, I have reviewed and discussed with patient certain preventive protocols, quality metrics, and best practice recommendations. A written personalized care plan for preventive services as well as general preventive health recommendations were provided to patient.   Signed,  Clemetine Marker, LPN Nurse Health Advisor  Nurse Notes: pt's at home BP reading elevated at 131/101 which has been baseline for patient at recent visits. Pt has follow up appt in 2 weeks.

## 2020-01-11 NOTE — Patient Instructions (Signed)
Sarah Horne , Thank you for taking time to come for your Medicare Wellness Visit. I appreciate your ongoing commitment to your health goals. Please review the following plan we discussed and let me know if I can assist you in the future.   Screening recommendations/referrals: Colonoscopy: done 02/13/17 Mammogram: postponed Bone Density: done 10/15/15 Recommended yearly ophthalmology/optometry visit for glaucoma screening and checkup Recommended yearly dental visit for hygiene and checkup  Vaccinations: Influenza vaccine: postponed Pneumococcal vaccine: postponed Tdap vaccine: done 01/18/18 Shingles vaccine: postponed   Covid-19: first dose 01/06/20  Advanced directives: Please bring a copy of your health care power of attorney and living will to the office at your convenience.  Conditions/risks identified: Recommend continuing mild physical activity as tolerated  Next appointment: Please follow up in one year for your Medicare Annual Wellness visit.     Preventive Care 66 Years and Older, Female Preventive care refers to lifestyle choices and visits with your health care provider that can promote health and wellness. What does preventive care include?  A yearly physical exam. This is also called an annual well check.  Dental exams once or twice a year.  Routine eye exams. Ask your health care provider how often you should have your eyes checked.  Personal lifestyle choices, including:  Daily care of your teeth and gums.  Regular physical activity.  Eating a healthy diet.  Avoiding tobacco and drug use.  Limiting alcohol use.  Practicing safe sex.  Taking low-dose aspirin every day.  Taking vitamin and mineral supplements as recommended by your health care provider. What happens during an annual well check? The services and screenings done by your health care provider during your annual well check will depend on your age, overall health, lifestyle risk factors, and  family history of disease. Counseling  Your health care provider may ask you questions about your:  Alcohol use.  Tobacco use.  Drug use.  Emotional well-being.  Home and relationship well-being.  Sexual activity.  Eating habits.  History of falls.  Memory and ability to understand (cognition).  Work and work Statistician.  Reproductive health. Screening  You may have the following tests or measurements:  Height, weight, and BMI.  Blood pressure.  Lipid and cholesterol levels. These may be checked every 5 years, or more frequently if you are over 66 years old.  Skin check.  Lung cancer screening. You may have this screening every year starting at age 66 if you have a 30-pack-year history of smoking and currently smoke or have quit within the past 15 years.  Fecal occult blood test (FOBT) of the stool. You may have this test every year starting at age 66.  Flexible sigmoidoscopy or colonoscopy. You may have a sigmoidoscopy every 5 years or a colonoscopy every 10 years starting at age 66.  Hepatitis C blood test.  Hepatitis B blood test.  Sexually transmitted disease (STD) testing.  Diabetes screening. This is done by checking your blood sugar (glucose) after you have not eaten for a while (fasting). You may have this done every 1-3 years.  Bone density scan. This is done to screen for osteoporosis. You may have this done starting at age 66.  Mammogram. This may be done every 1-2 years. Talk to your health care provider about how often you should have regular mammograms. Talk with your health care provider about your test results, treatment options, and if necessary, the need for more tests. Vaccines  Your health care provider may recommend certain vaccines,  such as:  Influenza vaccine. This is recommended every year.  Tetanus, diphtheria, and acellular pertussis (Tdap, Td) vaccine. You may need a Td booster every 10 years.  Zoster vaccine. You may need this  after age 64.  Pneumococcal 13-valent conjugate (PCV13) vaccine. One dose is recommended after age 65.  Pneumococcal polysaccharide (PPSV23) vaccine. One dose is recommended after age 6. Talk to your health care provider about which screenings and vaccines you need and how often you need them. This information is not intended to replace advice given to you by your health care provider. Make sure you discuss any questions you have with your health care provider. Document Released: 10/11/2015 Document Revised: 06/03/2016 Document Reviewed: 07/16/2015 Elsevier Interactive Patient Education  2017 [place] Prevention in the Home Falls can cause injuries. They can happen to people of all ages. There are many things you can do to make your home safe and to help prevent falls. What can I do on the outside of my home?  Regularly fix the edges of walkways and driveways and fix any cracks.  Remove anything that might make you trip as you walk through a door, such as a raised step or threshold.  Trim any bushes or trees on the path to your home.  Use bright outdoor lighting.  Clear any walking paths of anything that might make someone trip, such as rocks or tools.  Regularly check to see if handrails are loose or broken. Make sure that both sides of any steps have handrails.  Any raised decks and porches should have guardrails on the edges.  Have any leaves, snow, or ice cleared regularly.  Use sand or salt on walking paths during winter.  Clean up any spills in your garage right away. This includes oil or grease spills. What can I do in the bathroom?  Use night lights.  Install grab bars by the toilet and in the tub and shower. Do not use towel bars as grab bars.  Use non-skid mats or decals in the tub or shower.  If you need to sit down in the shower, use a plastic, non-slip stool.  Keep the floor dry. Clean up any water that spills on the floor as soon as it  happens.  Remove soap buildup in the tub or shower regularly.  Attach bath mats securely with double-sided non-slip rug tape.  Do not have throw rugs and other things on the floor that can make you trip. What can I do in the bedroom?  Use night lights.  Make sure that you have a light by your bed that is easy to reach.  Do not use any sheets or blankets that are too big for your bed. They should not hang down onto the floor.  Have a firm chair that has side arms. You can use this for support while you get dressed.  Do not have throw rugs and other things on the floor that can make you trip. What can I do in the kitchen?  Clean up any spills right away.  Avoid walking on wet floors.  Keep items that you use a lot in easy-to-reach places.  If you need to reach something above you, use a strong step stool that has a grab bar.  Keep electrical cords out of the way.  Do not use floor polish or wax that makes floors slippery. If you must use wax, use non-skid floor wax.  Do not have throw rugs and other things on  the floor that can make you trip. What can I do with my stairs?  Do not leave any items on the stairs.  Make sure that there are handrails on both sides of the stairs and use them. Fix handrails that are broken or loose. Make sure that handrails are as long as the stairways.  Check any carpeting to make sure that it is firmly attached to the stairs. Fix any carpet that is loose or worn.  Avoid having throw rugs at the top or bottom of the stairs. If you do have throw rugs, attach them to the floor with carpet tape.  Make sure that you have a light switch at the top of the stairs and the bottom of the stairs. If you do not have them, ask someone to add them for you. What else can I do to help prevent falls?  Wear shoes that:  Do not have high heels.  Have rubber bottoms.  Are comfortable and fit you well.  Are closed at the toe. Do not wear sandals.  If you  use a stepladder:  Make sure that it is fully opened. Do not climb a closed stepladder.  Make sure that both sides of the stepladder are locked into place.  Ask someone to hold it for you, if possible.  Clearly mark and make sure that you can see:  Any grab bars or handrails.  First and last steps.  Where the edge of each step is.  Use tools that help you move around (mobility aids) if they are needed. These include:  Canes.  Walkers.  Scooters.  Crutches.  Turn on the lights when you go into a dark area. Replace any light bulbs as soon as they burn out.  Set up your furniture so you have a clear path. Avoid moving your furniture around.  If any of your floors are uneven, fix them.  If there are any pets around you, be aware of where they are.  Review your medicines with your doctor. Some medicines can make you feel dizzy. This can increase your chance of falling. Ask your doctor what other things that you can do to help prevent falls. This information is not intended to replace advice given to you by your health care provider. Make sure you discuss any questions you have with your health care provider. Document Released: 07/11/2009 Document Revised: 02/20/2016 Document Reviewed: 10/19/2014 Elsevier Interactive Patient Education  2017 [employer].

## 2020-01-12 ENCOUNTER — Encounter: Payer: Self-pay | Admitting: Internal Medicine

## 2020-01-12 ENCOUNTER — Other Ambulatory Visit: Payer: Self-pay

## 2020-01-14 ENCOUNTER — Other Ambulatory Visit: Payer: Self-pay | Admitting: Internal Medicine

## 2020-01-15 ENCOUNTER — Inpatient Hospital Stay: Payer: Medicare HMO

## 2020-01-15 ENCOUNTER — Other Ambulatory Visit: Payer: Self-pay

## 2020-01-15 ENCOUNTER — Inpatient Hospital Stay (HOSPITAL_BASED_OUTPATIENT_CLINIC_OR_DEPARTMENT_OTHER): Payer: Medicare HMO | Admitting: Internal Medicine

## 2020-01-15 DIAGNOSIS — C787 Secondary malignant neoplasm of liver and intrahepatic bile duct: Secondary | ICD-10-CM | POA: Diagnosis not present

## 2020-01-15 DIAGNOSIS — E785 Hyperlipidemia, unspecified: Secondary | ICD-10-CM | POA: Diagnosis not present

## 2020-01-15 DIAGNOSIS — D701 Agranulocytosis secondary to cancer chemotherapy: Secondary | ICD-10-CM | POA: Diagnosis not present

## 2020-01-15 DIAGNOSIS — C7800 Secondary malignant neoplasm of unspecified lung: Secondary | ICD-10-CM | POA: Diagnosis not present

## 2020-01-15 DIAGNOSIS — Z5189 Encounter for other specified aftercare: Secondary | ICD-10-CM | POA: Diagnosis not present

## 2020-01-15 DIAGNOSIS — C50412 Malignant neoplasm of upper-outer quadrant of left female breast: Secondary | ICD-10-CM

## 2020-01-15 DIAGNOSIS — Z170 Estrogen receptor positive status [ER+]: Secondary | ICD-10-CM

## 2020-01-15 DIAGNOSIS — Z7189 Other specified counseling: Secondary | ICD-10-CM

## 2020-01-15 DIAGNOSIS — C7931 Secondary malignant neoplasm of brain: Secondary | ICD-10-CM | POA: Diagnosis not present

## 2020-01-15 DIAGNOSIS — Z5111 Encounter for antineoplastic chemotherapy: Secondary | ICD-10-CM | POA: Diagnosis not present

## 2020-01-15 LAB — COMPREHENSIVE METABOLIC PANEL
ALT: 34 U/L (ref 0–44)
AST: 64 U/L — ABNORMAL HIGH (ref 15–41)
Albumin: 3.4 g/dL — ABNORMAL LOW (ref 3.5–5.0)
Alkaline Phosphatase: 202 U/L — ABNORMAL HIGH (ref 38–126)
Anion gap: 11 (ref 5–15)
BUN: 8 mg/dL (ref 8–23)
CO2: 27 mmol/L (ref 22–32)
Calcium: 8.7 mg/dL — ABNORMAL LOW (ref 8.9–10.3)
Chloride: 103 mmol/L (ref 98–111)
Creatinine, Ser: 0.84 mg/dL (ref 0.44–1.00)
GFR calc Af Amer: >60 mL/min (ref >60)
GFR calc non Af Amer: >60 mL/min (ref >60)
Glucose, Bld: 95 mg/dL (ref 70–99)
Potassium: 3.6 mmol/L (ref 3.5–5.1)
Sodium: 141 mmol/L (ref 135–145)
Total Bilirubin: 0.5 mg/dL (ref 0.3–1.2)
Total Protein: 7 g/dL (ref 6.5–8.1)

## 2020-01-15 LAB — CBC WITH DIFFERENTIAL/PLATELET
Abs Immature Granulocytes: 0.01 10*3/uL (ref 0.00–0.07)
Basophils Absolute: 0 10*3/uL (ref 0.0–0.1)
Basophils Relative: 0 %
Eosinophils Absolute: 0 10*3/uL (ref 0.0–0.5)
Eosinophils Relative: 2 %
HCT: 40.4 % (ref 36.0–46.0)
Hemoglobin: 13 g/dL (ref 12.0–15.0)
Immature Granulocytes: 0 %
Lymphocytes Relative: 23 %
Lymphs Abs: 0.6 10*3/uL — ABNORMAL LOW (ref 0.7–4.0)
MCH: 31.3 pg (ref 26.0–34.0)
MCHC: 32.2 g/dL (ref 30.0–36.0)
MCV: 97.3 fL (ref 80.0–100.0)
Monocytes Absolute: 0.4 10*3/uL (ref 0.1–1.0)
Monocytes Relative: 13 %
Neutro Abs: 1.7 10*3/uL (ref 1.7–7.7)
Neutrophils Relative %: 62 %
Platelets: 285 10*3/uL (ref 150–400)
RBC: 4.15 MIL/uL (ref 3.87–5.11)
RDW: 17.2 % — ABNORMAL HIGH (ref 11.5–15.5)
Smear Review: ADEQUATE
WBC: 2.7 10*3/uL — ABNORMAL LOW (ref 4.0–10.5)
nRBC: 0 % (ref 0.0–0.2)

## 2020-01-15 MED ORDER — HEPARIN SOD (PORK) LOCK FLUSH 100 UNIT/ML IV SOLN
INTRAVENOUS | Status: AC
  Filled 2020-01-15: qty 5

## 2020-01-15 MED ORDER — HEPARIN SOD (PORK) LOCK FLUSH 100 UNIT/ML IV SOLN
500.0000 [IU] | Freq: Once | INTRAVENOUS | Status: AC
  Administered 2020-01-15: 500 [IU] via INTRAVENOUS
  Filled 2020-01-15: qty 5

## 2020-01-15 MED ORDER — HEPARIN SOD (PORK) LOCK FLUSH 100 UNIT/ML IV SOLN
500.0000 [IU] | Freq: Once | INTRAVENOUS | Status: DC | PRN
  Filled 2020-01-15: qty 5

## 2020-01-15 MED ORDER — SODIUM CHLORIDE 0.9 % IV SOLN
800.0000 mg/m2 | Freq: Once | INTRAVENOUS | Status: AC
  Administered 2020-01-15: 1444 mg via INTRAVENOUS
  Filled 2020-01-15: qty 26.3

## 2020-01-15 MED ORDER — SODIUM CHLORIDE 0.9% FLUSH
10.0000 mL | INTRAVENOUS | Status: DC | PRN
  Administered 2020-01-15: 10 mL via INTRAVENOUS
  Filled 2020-01-15: qty 10

## 2020-01-15 MED ORDER — PEGFILGRASTIM 6 MG/0.6ML ~~LOC~~ PSKT
6.0000 mg | PREFILLED_SYRINGE | Freq: Once | SUBCUTANEOUS | Status: AC
  Administered 2020-01-15: 6 mg via SUBCUTANEOUS
  Filled 2020-01-15: qty 0.6

## 2020-01-15 MED ORDER — SODIUM CHLORIDE 0.9 % IV SOLN
Freq: Once | INTRAVENOUS | Status: AC
  Filled 2020-01-15: qty 250

## 2020-01-15 MED ORDER — PROCHLORPERAZINE MALEATE 10 MG PO TABS
10.0000 mg | ORAL_TABLET | Freq: Once | ORAL | Status: AC
  Administered 2020-01-15: 10 mg via ORAL
  Filled 2020-01-15: qty 1

## 2020-01-15 NOTE — Progress Notes (Signed)
Pt in for follow up, reports RN called and did pre assessment, denies any changes.

## 2020-01-15 NOTE — Assessment & Plan Note (Addendum)
#  Recurrent metastatic breast cancer-lung/liver metastases ER PR positive HER-2 negative; CT scan March second 2021-partial response noted-improved liver lesions; lung lesion.  No new lesions.  Currently gemcitabine single agent.  #We will plan gemcitabine every 2 weeks; because of neutropenia.  We will add on pro.  Patient agreement.  #Proceed with cycle number 2 of gemcitabine today.  White count 2.7; ANC pending.  Discussed regarding onpr.   # Constipation- recommend miralax BID prn; increase fluid intake/fibre.   #Gait instability/visual disturbance-[Dr.Shah]-clinically stable.  Recommend follow-up regarding EEG.  # cardiomyopathy/ Sinus tachycardia- on metoprolol-/Tamabcor; [march 2d-echo- 40-45%]; stable.   # Elevated LFTs-likely secondary to progressive malignancy;stable.   # Peripheral neuropathy grade 1-2-  on Lyrica; STABLE>    # Right lower extremity DVT bilateral-November 28, 2019--STABLE on eliquis.   # DISPOSITION:  # gemcitabine/onprpo today;  # 2 weeks MD; labs- cbc/cmp;GEM- onpro-Dr.B

## 2020-01-15 NOTE — Patient Instructions (Signed)
recommend miralx twice a day.

## 2020-01-15 NOTE — Progress Notes (Signed)
[place] OFFICE PROGRESS NOTE  Patient Care Team: Delsa Grana, PA-C as PCP - General (Family Medicine) Wellington Hampshire, MD as PCP - Cardiology (Cardiology) Bary Castilla Forest Gleason, MD (General Surgery) Vladimir Crofts, MD as Consulting Physician (Neurology)  Cancer Staging No matching staging information was found for the patient.   Oncology History Overview Note  # LEFT BREAST IDC; STAGE II [cT2N1] ER >90%; PR- 50-90%; her 2 NEU- POS; s/p Neoadj chemo; AUG 2016- TCH+P s/p Lumpec & partial ALND- path CR; s/p RT [finished Nov 2016];  adj Herceptin; HELD for Jan 19th 2017 [in DEC 2016-EF dropped from 63 to 51%; FEB 27th EF-42.9%]; JAN 2016 START Arimidex; May 2017- EF- 60%; May 24th 2017-Re-start Herceptin q 3W; July 28th STOP herceptin [finished 52m/m2; sec to Low EF; however 2017 Oct EF= 67%; improved]  # DEC 4th 2017- Start Neratinib 4 pills; DEC 11th 3 pills; STOPPED.  # FEB 2018- RECURRENCE ER/PR positive; Her 2 NEGATIVE [liver Bx]  # MARCH 1st 2018- Tax-Cytoxan [poor tol]  # FEB 2018- Brain mets [SBRT; Dr.Crystal; finished Feb 28th 2018]  # March 2018- Eribulin- poor tolerance  # June 8th 2018- Started Faslodex + Abema [abema-multiple interruptions sec to neutropenia]  # MRI Brain- March 2019- Leptomeningeal mets [WBRT; No LP s/p WBRT- 01/09/2018]; STOP Abema [56mday-sec to severe cytopenia]+faslodex  # MAY 20tth 019- Start Xeloda; STOPPED in Nov 22nd 2019- sec to Progressive liver lesions on CT scan  # NOV 26th 2019- Faslodex + Piqaray 25025m; Jan 2020- Piqray 300 mg+ faslodex; FEB 17th CT scan- mixed response/ Overall progression; STOP Piqray + faslodex.   # FEB 25th 2020- ERIBULIN; Poor tolerance sec to severe neutropenia.  Stopped May 2020.  CT scan May 2020-new/progressive 2 cm liver lesion;   #July nd 2020-Ixempra x5 cycles-; NOV 2020-MRI liver- worsening liver lesions [rising AST/ALT].   # NOV [id number] weekly [consent-]; December  31-2020-progression/rising LFTs.  Stop Taxol  #January 15th, 2021-Adriamycin weekly [C]; MARCH 2021- Partial response. STOP ADRIAMYCIN sec to EF drop 40-45%.   # April 2nd-2021-gemcitabine; 01/15/2020-GEM q 2 W; with onpro.   #May 06, 2019-MRI brain 5 new lesions [3 mm to 10 mm]-asymptomatic; [previous whole brain radiation]- OCT 2020- Re-Irradiation.   # PN- G-2; may 2017- Cymbalta 23m32mMARCH 2018-  acute vascular insuff of BIL LE [? Taxotere vasoconstriction on asprin]; # hemorragic shock LGIB [s/p colo; Dr.Wohl]  #  SVT- on flecainide.   # April 2016- Liver Bx- NEG;   # Drop in EF from Herceptin [recovered OCt 27th- EF 65%]; March 2020 ejection fraction 40 to 45%.  Stop Adriamycin.  # BMD- jan 2017- osteopenia  # 2020- SEP-s/p Pallaitive care evaluation  -----------------------------------------------------------     [place]- PDL-1 0%/NEG for BRCA; MSI-Stable; Positive for PI3K; ccnd-1; FGF** -----------------------------------------------------------  Dx: Metastatic Breast cancer [ER/PR-Pos; her -2 NEG] Stage IV; goals: palliative Current treatment-gemcitabine chemotherapy [C]   Carcinoma of upper-outer quadrant of left breast in female, estrogen receptor positive (HCC)[place]/25/2020 - 01/17/2019 Chemotherapy   The patient had eriBULin mesylate (HALAVEN) 2.1 mg in sodium chloride 0.9 % 100 mL chemo infusion, 1.2 mg/m2 = 2.1 mg (100 % of original dose 1.2 mg/m2), Intravenous,  Once, 3 of 4 cycles Dose modification: 1.2 mg/m2 (original dose 1.2 mg/m2, Cycle 1, Reason: Provider Judgment), 0.8 mg/m2 (original dose 1.2 mg/m2, Cycle 3, Reason: Provider Judgment) Administration: 2.1 mg (11/22/2018), 2.1 mg (11/29/2018), 2.1 mg (12/20/2018), 1.4 mg (01/17/2019)  for  chemotherapy treatment.    03/30/2019 - 07/12/2019 Chemotherapy   The patient had pegfilgrastim (NEULASTA ONPRO KIT) injection 6 mg, 6 mg, Subcutaneous, Once, 5 of 8 cycles Administration:  6 mg (03/30/2019), 6 mg (04/20/2019), 6 mg (05/11/2019), 6 mg (06/22/2019), 6 mg (06/01/2019) ixabepilone (IXEMPRA) 60 mg in lactated ringers 250 mL chemo infusion, 33.5 mg/m2 = 57 mg (100 % of original dose 32 mg/m2), Intravenous,  Once, 5 of 8 cycles Dose modification: 32 mg/m2 (original dose 32 mg/m2, Cycle 1, Reason: Provider Judgment) Administration: 72 mg (04/20/2019), 72 mg (05/11/2019), 72 mg (06/22/2019), 72 mg (06/01/2019)  for chemotherapy treatment.    08/18/2019 - 09/28/2019 Chemotherapy   The patient had PACLitaxel (TAXOL) 150 mg in sodium chloride 0.9 % 250 mL chemo infusion (</= 41m/m2), 80 mg/m2 = 150 mg, Intravenous,  Once, 1 of 4 cycles Administration: 150 mg (08/18/2019), 150 mg (09/08/2019), 150 mg (09/15/2019)  for chemotherapy treatment.    10/13/2019 - 11/30/2019 Chemotherapy   The patient had DOXOrubicin (ADRIAMYCIN) chemo injection 38 mg, 20 mg/m2 = 38 mg, Intravenous,  Once, 6 of 9 cycles Administration: 38 mg (10/13/2019), 38 mg (10/20/2019), 38 mg (10/27/2019), 38 mg (11/17/2019), 38 mg (11/24/2019), 38 mg (11/10/2019) palonosetron (ALOXI) injection 0.25 mg, 0.25 mg, Intravenous,  Once, 6 of 9 cycles Administration: 0.25 mg (10/13/2019), 0.25 mg (10/20/2019), 0.25 mg (10/27/2019), 0.25 mg (11/17/2019), 0.25 mg (11/24/2019), 0.25 mg (11/10/2019)  for chemotherapy treatment.    12/29/2019 -  Chemotherapy   The patient had pegfilgrastim (NEULASTA ONPRO KIT) injection 6 mg, 6 mg, Subcutaneous, Once, 0 of 3 cycles gemcitabine (GEMZAR) 1,444 mg in sodium chloride 0.9 % 250 mL chemo infusion, 800 mg/m2 = 1,444 mg, Intravenous,  Once, 1 of 4 cycles Administration: 1,444 mg (12/29/2019)  for chemotherapy treatment.      INTERVAL HISTORY:  Sarah [id number].  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  Patient's cycle number 1 day 8 was held 1 week ago because of  neutropenia.  Patient denies any fevers or chills.  Complains of constipation-states her last bowel movement was approximately 1 week ago.  No blood in stools.  No abdominal pain.  Denies any further falls.   Review of Systems  Constitutional: Positive for malaise/fatigue. Negative for chills, diaphoresis, fever and weight loss.  HENT: Negative for nosebleeds and sore throat.   Eyes: Negative for double vision.  Respiratory: Negative for cough, hemoptysis, sputum production, shortness of breath and wheezing.   Cardiovascular: Negative for chest pain, palpitations, orthopnea and leg swelling.  Gastrointestinal: Positive for constipation. Negative for abdominal pain, blood in stool, heartburn, melena, nausea and vomiting.  Genitourinary: Negative for dysuria, frequency and urgency.  Musculoskeletal: Positive for joint pain. Negative for back pain.  Skin: Negative for itching.  Neurological: Positive for dizziness and tingling. Negative for focal weakness, weakness and headaches.  Endo/Heme/Allergies: Does not bruise/bleed easily.  Psychiatric/Behavioral: Negative for depression. The patient is not nervous/anxious and does not have insomnia.     PAST MEDICAL HISTORY :  Past Medical History:  Diagnosis Date  . Chemotherapy-induced peripheral neuropathy ([place]   . Epistaxis    a. 11/2016 in setting of asa/plavix-->silver nitrate cauterization.  . GI bleed    a. 11/2016 Admission w/ GIB and hypovolemic shock req 3u PRBC's;  b. 11/2016 ECG: gastritis & nonbleeding peptic ulcer; c. 11/2016 Conlonoscopy: rectal and sigmoid colonic ulcers.  . Heart  attack [facility])    a. 1998 Cath @ [facility]: reportedly no intervention required.  Marland Kitchen Herceptin-induced cardiomyopathy ([place])    a. In the setting of Herceptin Rx for breast cancer (initiated 12/2014); b. 03/2015 MUGA EF 64%; b. 08/2015 MUGA: EF 51%; c. 10/2015 MUGA: EF 44%; d. 11/2015 Echo: EF 45-50%; e. 01/2016 MUGA: EF 60%; f. 06/2016 MUGA EF 65%; g. 10/2016 MUGA: EF  61%;  h. 12/2016 Echo: EF 55-60%, gr1 DD.  Marland Kitchen Hyperlipidemia   . Hypertension   . Neuropathy   . Possible PAD (peripheral artery disease) ([place])    a. 11/2016 LE cyanosis and weak pulses-->CTA w/o significant Ao-BiFem dzs. ? distal dzs-->ASA/Plavix initiated by vascular surgery.  Marland Kitchen PSVT (paroxysmal supraventricular tachycardia) ([place])    a. Dx 11/2016.  . Pulmonary embolism ([place])    a. 12/2016 CTA Chest: small nonocclusive PE in inferior segment of the Left lingula, somewhat eccentric filling defect suggesting chronic rather than acute embolic event; b. 10/7780 LE U/S:  No DVT; c. 12/2016 Echo: Nl RV fxn, nl PASP.  Marland Kitchen Recurrent Metastatic breast cancer ([place])    a. Dx 2016: Stage II, ER positive, PR positive, HER-2/neu overexpressing of the left breast-->chemo/radiation; b. 10/2016 CT Abd/pelvis: diffuse liver mets, ill defined sclerotic bone lesions-T12;  c. 10/2016 MRI brain: metastatic lesion along L temporal lobe (19x59m) w/ extensive surrounding edema & 565mmidline shift to right.  . [place]  . Sinus tachycardia     PAST SURGICAL HISTORY :   Past Surgical History:  Procedure Laterality Date  . BREAST BIOPSY Left 2016   Positive  . BREAST LUMPECTOMY WITH SENTINEL LYMPH NODE BIOPSY Left 05/23/2015   Procedure: LEFT BREAST WIDE EXCISION WITH AXILLARY DISSECTION, MASTOPLASTY ;  Surgeon: JeRobert BellowMD;  Location: [facility] ORS;  Service: General;  Laterality: Left;  . BREAST SURGERY Left 12/18/14   breast biopsy/INVASIVE DUCTAL CARCINOMA OF BREAST, NOTTINGHAM GRADE 2.  . Marland KitchenREAST SURGERY  05/23/2015.   Wide excision/mastoplasty, axillary dissection. No residual invasive cancer, positive for residual DCIS. 0/2 nodes identified on axillary dissection. (no SLN by technetium or methylene blue)  . CARDIAC CATHETERIZATION    . COLONOSCOPY WITH PROPOFOL N/A 12/10/2016   Procedure: COLONOSCOPY WITH PROPOFOL;  Surgeon: DaLucilla LameMD;  Location: [facility] ENDOSCOPY;  Service: Endoscopy;  Laterality: N/A;  .  COLONOSCOPY WITH PROPOFOL N/A 02/13/2017   Procedure: COLONOSCOPY WITH PROPOFOL;  Surgeon: WoLucilla LameMD;  [facility];  Service: Endoscopy;  Laterality: N/A;  . ESOPHAGOGASTRODUODENOSCOPY N/A 11/03/2019   Procedure: ESOPHAGOGASTRODUODENOSCOPY (EGD);  Surgeon: VaLin LandsmanMD;  [facility];  Service: Gastroenterology;  Laterality: N/A;  . ESOPHAGOGASTRODUODENOSCOPY (EGD) WITH PROPOFOL N/A 12/08/2016   Procedure: ESOPHAGOGASTRODUODENOSCOPY (EGD) WITH PROPOFOL;  Surgeon: DaLucilla LameMD;  Location: [facility] ENDOSCOPY;  Service: Endoscopy;  Laterality: N/A;  . IVC FILTER INSERTION N/A 02/15/2017   Procedure: IVC Filter Insertion;  Surgeon: DeAlgernon HuxleyMD;  [facility];  Service: Cardiovascular;  Laterality: N/A;  . PORTACATH PLACEMENT Right 12-31-14   Dr ByBary Castilla  FAMILY HISTORY :   Family History  Problem Relation Age of Onset  . Breast cancer Maternal Aunt   . Breast cancer Cousin   . Brain cancer Maternal Uncle   . Diabetes Mother   . Hypertension Mother   . Stroke Mother     SOCIAL HISTORY:   Social History   Tobacco Use  . Smoking status: Light Tobacco Smoker    Packs/day: 0.25  Years: 18.00    Pack years: 4.50    Types: Cigarettes  . Smokeless tobacco: Never Used  Substance Use Topics  . Alcohol use: No    Alcohol/week: 0.0 standard drinks  . Drug use: No    ALLERGIES:  is allergic to no known allergies.  MEDICATIONS:  Current Outpatient Medications  Medication Sig Dispense Refill  . acetaminophen (TYLENOL) 650 MG CR tablet Take 650 mg by mouth every 8 (eight) hours as needed for pain.    Marland Kitchen apixaban (ELIQUIS) 5 MG TABS tablet Take 1 tablet (5 mg total) by mouth 2 (two) times daily. 60 tablet 4  . flecainide (TAMBOCOR) 50 MG tablet Take 1 tablet (50 mg total) by mouth 2 (two) times daily. 60 tablet 4  . ipratropium (ATROVENT) 0.03 % nasal spray Place 2 sprays into both nostrils every 12 (twelve) hours. 30 mL 12  .  levocetirizine (XYZAL) 5 MG tablet Take 1 tablet (5 mg total) by mouth every evening. 30 tablet 2  . magnesium oxide (MAG-OX) 400 MG tablet Take 1 tablet (400 mg total) by mouth 2 (two) times daily. 60 tablet 5  . meclizine (ANTIVERT) 25 MG tablet Take 1 tablet (25 mg total) by mouth 3 (three) times daily as needed for dizziness. 30 tablet 0  . metoprolol tartrate (LOPRESSOR) 25 MG tablet Take 1 tablet (25 mg total) by mouth 2 (two) times daily. 60 tablet 1  . montelukast (SINGULAIR) 10 MG tablet Take 1 tablet (10 mg total) by mouth at bedtime. 60 tablet 3  . ondansetron (ZOFRAN) 8 MG tablet One pill every 8 hours as needed for nausea/vomitting. 40 tablet 1  . pregabalin (LYRICA) 100 MG capsule Take 1 capsule (100 mg total) by mouth 2 (two) times daily. 60 capsule 2  . prochlorperazine (COMPAZINE) 10 MG tablet Take 1 tablet (10 mg total) by mouth every 6 (six) hours as needed for nausea or vomiting. 40 tablet 1  . Vitamin D, Ergocalciferol, (DRISDOL) 1.25 MG (50000 UNIT) CAPS capsule TAKE 1 CAPSULE (50,000 UNITS TOTAL) ONCE A WEEK BY MOUTH. 12 capsule 1   No current facility-administered medications for this visit.   Facility-Administered Medications Ordered in Other Visits  Medication Dose Route Frequency Provider Last Rate Last Admin  . 0.9 %  sodium chloride infusion   Intravenous Continuous [name], MD   New Bag at 12/10/16 1140  . 0.9 %  sodium chloride infusion   Intravenous Continuous Lloyd Huger, MD 999 mL/hr at 04/24/15 1520 New Bag at 05/23/15 0951  . 0.9 %  sodium chloride infusion   Intravenous Continuous Verlon Au, NP 999 mL/hr at 10/31/19 1522 New Bag at 10/31/19 1522  . heparin lock flush 100 unit/mL  500 Units Intravenous Once Berenzon, Dmitriy, MD      . heparin lock flush 100 unit/mL  500 Units Intravenous Once Charlaine Dalton R, MD      . sodium chloride 0.9 % injection 10 mL  10 mL Intravenous PRN [name], MD   10 mL at 04/04/15 1440  . sodium  chloride flush (NS) 0.9 % injection 10 mL  10 mL Intravenous PRN Berenzon, Dmitriy, MD      . sodium chloride flush (NS) 0.9 % injection 10 mL  10 mL Intravenous PRN Cammie Sickle, MD   10 mL at 01/15/20 0848    PHYSICAL EXAMINATION: ECOG PERFORMANCE STATUS: 1 - Symptomatic but completely ambulatory  BP (!) 131/92 (BP Location: Right Arm, Patient Position: Sitting)  Pulse 98   Temp 100 F (37.8 C) (Tympanic)   Resp 18   Wt 155 lb 6.4 oz (70.5 kg)   SpO2 100%   BMI 25.08 kg/m   Filed Weights   01/15/20 0905  Weight: 155 lb 6.4 oz (70.5 kg)    Physical Exam  Constitutional: She is oriented to person, place, and time and well-developed, well-nourished, and in no distress.  Patient is alone.  In wheel chair.   HENT:  Head: Normocephalic and atraumatic.  Mouth/Throat: Oropharynx is clear and moist. No oropharyngeal exudate.  Eyes: Pupils are equal, round, and reactive to light.  Cardiovascular: Normal rate and regular rhythm.  Pulmonary/Chest: Effort normal and breath sounds normal. No respiratory distress. She has no wheezes.  Abdominal: Soft. Bowel sounds are normal. She exhibits no distension and no mass. There is no abdominal tenderness. There is no rebound and no guarding.  Musculoskeletal:        General: No tenderness. Normal range of motion.     Cervical back: Normal range of motion and neck supple.  Neurological: She is alert and oriented to person, place, and time.  Broad based gait.  Unsteady.  Skin: Skin is warm.  Psychiatric: Affect normal.    LABORATORY DATA:  I have reviewed the data as listed    Component Value Date/Time   NA 141 01/15/2020 0836   NA 135 03/04/2017 0918   NA 135 01/08/2015 0850   K 3.6 01/15/2020 0836   K 3.7 01/08/2015 0850   CL 103 01/15/2020 0836   CL 101 01/08/2015 0850   CO2 27 01/15/2020 0836   CO2 26 01/08/2015 0850   GLUCOSE 95 01/15/2020 0836   GLUCOSE 161 (H) 01/08/2015 0850   BUN 8 01/15/2020 0836   BUN 8  03/04/2017 0918   BUN 17 01/08/2015 0850   CREATININE 0.84 01/15/2020 0836   CREATININE 0.82 01/22/2015 1559   CALCIUM 8.7 (L) 01/15/2020 0836   CALCIUM 9.3 01/08/2015 0850   PROT 7.0 01/15/2020 0836   PROT 6.4 03/04/2017 0918   PROT 6.8 01/22/2015 1559   ALBUMIN 3.4 (L) 01/15/2020 0836   ALBUMIN 2.5 (L) 03/04/2017 0918   ALBUMIN 3.9 01/22/2015 1559   AST 64 (H) 01/15/2020 0836   AST 34 01/22/2015 1559   ALT 34 01/15/2020 0836   ALT 42 01/22/2015 1559   ALKPHOS 202 (H) 01/15/2020 0836   ALKPHOS 157 (H) 01/22/2015 1559   BILITOT 0.5 01/15/2020 0836   BILITOT 6.2 (H) 03/04/2017 0918   BILITOT 0.2 (L) 01/22/2015 1559   GFRNONAA >60 01/15/2020 0836   GFRNONAA >60 01/22/2015 1559   GFRAA >60 01/15/2020 0836   GFRAA >60 01/22/2015 1559    No results found for: SPEP, UPEP  Lab Results  Component Value Date   WBC 2.7 (L) 01/15/2020   NEUTROABS PENDING 01/15/2020   HGB 13.0 01/15/2020   HCT 40.4 01/15/2020   MCV 97.3 01/15/2020   PLT 285 01/15/2020      Chemistry      Component Value Date/Time   NA 141 01/15/2020 0836   NA 135 03/04/2017 0918   NA 135 01/08/2015 0850   K 3.6 01/15/2020 0836   K 3.7 01/08/2015 0850   CL 103 01/15/2020 0836   CL 101 01/08/2015 0850   CO2 27 01/15/2020 0836   CO2 26 01/08/2015 0850   BUN 8 01/15/2020 0836   BUN 8 03/04/2017 0918   BUN 17 01/08/2015 0850   CREATININE 0.84 01/15/2020 0836  CREATININE 0.82 01/22/2015 1559      Component Value Date/Time   CALCIUM 8.7 (L) 01/15/2020 0836   CALCIUM 9.3 01/08/2015 0850   ALKPHOS 202 (H) 01/15/2020 0836   ALKPHOS 157 (H) 01/22/2015 1559   AST 64 (H) 01/15/2020 0836   AST 34 01/22/2015 1559   ALT 34 01/15/2020 0836   ALT 42 01/22/2015 1559   BILITOT 0.5 01/15/2020 0836   BILITOT 6.2 (H) 03/04/2017 0918   BILITOT 0.2 (L) 01/22/2015 1559       RADIOGRAPHIC STUDIES: I have personally reviewed the radiological images as listed and agreed with the findings in the report. No results  found.   ASSESSMENT & PLAN:  Carcinoma of upper-outer quadrant of left breast in female, estrogen receptor positive ([place]) # Recurrent metastatic breast cancer-lung/liver metastases ER PR positive HER-2 negative; CT scan March second 2021-partial response noted-improved liver lesions; lung lesion.  No new lesions.  Currently gemcitabine single agent.  #We will plan gemcitabine every 2 weeks; because of neutropenia.  We will add on pro.  Patient agreement.  #Proceed with cycle number 2 of gemcitabine today.  White count 2.7; ANC pending.  Discussed regarding onpr.   # Constipation- recommend miralax BID prn; increase fluid intake/fibre.   #Gait instability/visual disturbance-[Dr.Shah]-clinically stable.  Recommend follow-up regarding EEG.  # cardiomyopathy/ Sinus tachycardia- on metoprolol-/Tamabcor; [march 2d-echo- 40-45%]; stable.   # Elevated LFTs-likely secondary to progressive malignancy;stable.   # Peripheral neuropathy grade 1-2-  on Lyrica; STABLE>    # Right lower extremity DVT bilateral-November 28, 2019--STABLE on eliquis.   # DISPOSITION:  # gemcitabine/onprpo today;  # 2 weeks MD; labs- cbc/cmp;GEM- onpro-Dr.B     No orders of the defined types were placed in this encounter.  All questions were answered. The patient knows to call the clinic with any problems, questions or concerns.     Cammie Sickle, MD 01/15/2020 9:34 AM

## 2020-01-17 DIAGNOSIS — I82401 Acute embolism and thrombosis of unspecified deep veins of right lower extremity: Secondary | ICD-10-CM | POA: Diagnosis not present

## 2020-01-17 DIAGNOSIS — T451X5D Adverse effect of antineoplastic and immunosuppressive drugs, subsequent encounter: Secondary | ICD-10-CM | POA: Diagnosis not present

## 2020-01-17 DIAGNOSIS — C7931 Secondary malignant neoplasm of brain: Secondary | ICD-10-CM | POA: Diagnosis not present

## 2020-01-17 DIAGNOSIS — C787 Secondary malignant neoplasm of liver and intrahepatic bile duct: Secondary | ICD-10-CM | POA: Diagnosis not present

## 2020-01-17 DIAGNOSIS — C50412 Malignant neoplasm of upper-outer quadrant of left female breast: Secondary | ICD-10-CM | POA: Diagnosis not present

## 2020-01-17 DIAGNOSIS — C182 Malignant neoplasm of ascending colon: Secondary | ICD-10-CM | POA: Diagnosis not present

## 2020-01-17 DIAGNOSIS — D702 Other drug-induced agranulocytosis: Secondary | ICD-10-CM | POA: Diagnosis not present

## 2020-01-17 DIAGNOSIS — G620 Drug-induced polyneuropathy: Secondary | ICD-10-CM | POA: Diagnosis not present

## 2020-01-17 DIAGNOSIS — I10 Essential (primary) hypertension: Secondary | ICD-10-CM | POA: Diagnosis not present

## 2020-01-19 ENCOUNTER — Other Ambulatory Visit: Payer: Self-pay | Admitting: Family Medicine

## 2020-01-19 DIAGNOSIS — J309 Allergic rhinitis, unspecified: Secondary | ICD-10-CM

## 2020-01-22 DIAGNOSIS — T451X5D Adverse effect of antineoplastic and immunosuppressive drugs, subsequent encounter: Secondary | ICD-10-CM | POA: Diagnosis not present

## 2020-01-22 DIAGNOSIS — C182 Malignant neoplasm of ascending colon: Secondary | ICD-10-CM | POA: Diagnosis not present

## 2020-01-22 DIAGNOSIS — C7931 Secondary malignant neoplasm of brain: Secondary | ICD-10-CM | POA: Diagnosis not present

## 2020-01-22 DIAGNOSIS — G620 Drug-induced polyneuropathy: Secondary | ICD-10-CM | POA: Diagnosis not present

## 2020-01-22 DIAGNOSIS — D702 Other drug-induced agranulocytosis: Secondary | ICD-10-CM | POA: Diagnosis not present

## 2020-01-22 DIAGNOSIS — I82401 Acute embolism and thrombosis of unspecified deep veins of right lower extremity: Secondary | ICD-10-CM | POA: Diagnosis not present

## 2020-01-22 DIAGNOSIS — C787 Secondary malignant neoplasm of liver and intrahepatic bile duct: Secondary | ICD-10-CM | POA: Diagnosis not present

## 2020-01-22 DIAGNOSIS — I10 Essential (primary) hypertension: Secondary | ICD-10-CM | POA: Diagnosis not present

## 2020-01-22 DIAGNOSIS — C50412 Malignant neoplasm of upper-outer quadrant of left female breast: Secondary | ICD-10-CM | POA: Diagnosis not present

## 2020-01-22 NOTE — Progress Notes (Signed)
Pharmacist Chemotherapy Monitoring - Follow Up Assessment    I verify that I have reviewed each item in the below checklist:  . Regimen for the patient is scheduled for the appropriate day and plan matches scheduled date. Marland Kitchen Appropriate non-routine labs are ordered dependent on drug ordered. . If applicable, additional medications reviewed and ordered per protocol based on lifetime cumulative doses and/or treatment regimen.   Plan for follow-up and/or issues identified: No . I-vent associated with next due treatment: No . MD and/or nursing notified: No  Latish Toutant K 01/22/2020 9:11 AM

## 2020-01-24 ENCOUNTER — Ambulatory Visit: Payer: Medicare HMO | Admitting: Family Medicine

## 2020-01-25 DIAGNOSIS — C787 Secondary malignant neoplasm of liver and intrahepatic bile duct: Secondary | ICD-10-CM | POA: Diagnosis not present

## 2020-01-25 DIAGNOSIS — T451X5D Adverse effect of antineoplastic and immunosuppressive drugs, subsequent encounter: Secondary | ICD-10-CM | POA: Diagnosis not present

## 2020-01-25 DIAGNOSIS — D702 Other drug-induced agranulocytosis: Secondary | ICD-10-CM | POA: Diagnosis not present

## 2020-01-25 DIAGNOSIS — G620 Drug-induced polyneuropathy: Secondary | ICD-10-CM | POA: Diagnosis not present

## 2020-01-25 DIAGNOSIS — C7931 Secondary malignant neoplasm of brain: Secondary | ICD-10-CM | POA: Diagnosis not present

## 2020-01-25 DIAGNOSIS — I82401 Acute embolism and thrombosis of unspecified deep veins of right lower extremity: Secondary | ICD-10-CM | POA: Diagnosis not present

## 2020-01-25 DIAGNOSIS — C182 Malignant neoplasm of ascending colon: Secondary | ICD-10-CM | POA: Diagnosis not present

## 2020-01-25 DIAGNOSIS — C50412 Malignant neoplasm of upper-outer quadrant of left female breast: Secondary | ICD-10-CM | POA: Diagnosis not present

## 2020-01-25 DIAGNOSIS — I10 Essential (primary) hypertension: Secondary | ICD-10-CM | POA: Diagnosis not present

## 2020-01-29 ENCOUNTER — Inpatient Hospital Stay: Payer: Medicare HMO

## 2020-01-29 ENCOUNTER — Other Ambulatory Visit: Payer: Self-pay

## 2020-01-29 ENCOUNTER — Inpatient Hospital Stay: Payer: Medicare HMO | Attending: Internal Medicine

## 2020-01-29 ENCOUNTER — Inpatient Hospital Stay (HOSPITAL_BASED_OUTPATIENT_CLINIC_OR_DEPARTMENT_OTHER): Payer: Medicare HMO | Admitting: Internal Medicine

## 2020-01-29 DIAGNOSIS — Z5111 Encounter for antineoplastic chemotherapy: Secondary | ICD-10-CM | POA: Insufficient documentation

## 2020-01-29 DIAGNOSIS — Z170 Estrogen receptor positive status [ER+]: Secondary | ICD-10-CM

## 2020-01-29 DIAGNOSIS — H539 Unspecified visual disturbance: Secondary | ICD-10-CM | POA: Diagnosis not present

## 2020-01-29 DIAGNOSIS — Z86711 Personal history of pulmonary embolism: Secondary | ICD-10-CM | POA: Diagnosis not present

## 2020-01-29 DIAGNOSIS — K5900 Constipation, unspecified: Secondary | ICD-10-CM | POA: Insufficient documentation

## 2020-01-29 DIAGNOSIS — Z7902 Long term (current) use of antithrombotics/antiplatelets: Secondary | ICD-10-CM | POA: Insufficient documentation

## 2020-01-29 DIAGNOSIS — C7800 Secondary malignant neoplasm of unspecified lung: Secondary | ICD-10-CM | POA: Insufficient documentation

## 2020-01-29 DIAGNOSIS — R2689 Other abnormalities of gait and mobility: Secondary | ICD-10-CM | POA: Diagnosis not present

## 2020-01-29 DIAGNOSIS — Z833 Family history of diabetes mellitus: Secondary | ICD-10-CM | POA: Diagnosis not present

## 2020-01-29 DIAGNOSIS — C50412 Malignant neoplasm of upper-outer quadrant of left female breast: Secondary | ICD-10-CM

## 2020-01-29 DIAGNOSIS — Z5189 Encounter for other specified aftercare: Secondary | ICD-10-CM | POA: Diagnosis not present

## 2020-01-29 DIAGNOSIS — Z803 Family history of malignant neoplasm of breast: Secondary | ICD-10-CM | POA: Diagnosis not present

## 2020-01-29 DIAGNOSIS — Z7901 Long term (current) use of anticoagulants: Secondary | ICD-10-CM | POA: Diagnosis not present

## 2020-01-29 DIAGNOSIS — I10 Essential (primary) hypertension: Secondary | ICD-10-CM | POA: Insufficient documentation

## 2020-01-29 DIAGNOSIS — C787 Secondary malignant neoplasm of liver and intrahepatic bile duct: Secondary | ICD-10-CM | POA: Diagnosis not present

## 2020-01-29 DIAGNOSIS — G629 Polyneuropathy, unspecified: Secondary | ICD-10-CM | POA: Diagnosis not present

## 2020-01-29 DIAGNOSIS — E785 Hyperlipidemia, unspecified: Secondary | ICD-10-CM | POA: Diagnosis not present

## 2020-01-29 DIAGNOSIS — Z8249 Family history of ischemic heart disease and other diseases of the circulatory system: Secondary | ICD-10-CM | POA: Diagnosis not present

## 2020-01-29 DIAGNOSIS — Z79899 Other long term (current) drug therapy: Secondary | ICD-10-CM | POA: Diagnosis not present

## 2020-01-29 DIAGNOSIS — Z7189 Other specified counseling: Secondary | ICD-10-CM

## 2020-01-29 DIAGNOSIS — C7931 Secondary malignant neoplasm of brain: Secondary | ICD-10-CM | POA: Insufficient documentation

## 2020-01-29 DIAGNOSIS — I252 Old myocardial infarction: Secondary | ICD-10-CM | POA: Diagnosis not present

## 2020-01-29 DIAGNOSIS — F17210 Nicotine dependence, cigarettes, uncomplicated: Secondary | ICD-10-CM | POA: Diagnosis not present

## 2020-01-29 DIAGNOSIS — Z808 Family history of malignant neoplasm of other organs or systems: Secondary | ICD-10-CM | POA: Diagnosis not present

## 2020-01-29 LAB — CBC WITH DIFFERENTIAL/PLATELET
Abs Immature Granulocytes: 0.02 10*3/uL (ref 0.00–0.07)
Basophils Absolute: 0 10*3/uL (ref 0.0–0.1)
Basophils Relative: 0 %
Eosinophils Absolute: 0.1 10*3/uL (ref 0.0–0.5)
Eosinophils Relative: 1 %
HCT: 41.6 % (ref 36.0–46.0)
Hemoglobin: 13.4 g/dL (ref 12.0–15.0)
Immature Granulocytes: 0 %
Lymphocytes Relative: 13 %
Lymphs Abs: 0.6 10*3/uL — ABNORMAL LOW (ref 0.7–4.0)
MCH: 31 pg (ref 26.0–34.0)
MCHC: 32.2 g/dL (ref 30.0–36.0)
MCV: 96.3 fL (ref 80.0–100.0)
Monocytes Absolute: 0.5 10*3/uL (ref 0.1–1.0)
Monocytes Relative: 10 %
Neutro Abs: 3.8 10*3/uL (ref 1.7–7.7)
Neutrophils Relative %: 76 %
Platelets: 132 10*3/uL — ABNORMAL LOW (ref 150–400)
RBC: 4.32 MIL/uL (ref 3.87–5.11)
RDW: 18.8 % — ABNORMAL HIGH (ref 11.5–15.5)
WBC: 5 10*3/uL (ref 4.0–10.5)
nRBC: 0 % (ref 0.0–0.2)

## 2020-01-29 LAB — COMPREHENSIVE METABOLIC PANEL
ALT: 28 U/L (ref 0–44)
AST: 48 U/L — ABNORMAL HIGH (ref 15–41)
Albumin: 3.4 g/dL — ABNORMAL LOW (ref 3.5–5.0)
Alkaline Phosphatase: 224 U/L — ABNORMAL HIGH (ref 38–126)
Anion gap: 10 (ref 5–15)
BUN: 10 mg/dL (ref 8–23)
CO2: 27 mmol/L (ref 22–32)
Calcium: 8.8 mg/dL — ABNORMAL LOW (ref 8.9–10.3)
Chloride: 103 mmol/L (ref 98–111)
Creatinine, Ser: 0.82 mg/dL (ref 0.44–1.00)
GFR calc Af Amer: >60 mL/min (ref >60)
GFR calc non Af Amer: >60 mL/min (ref >60)
Glucose, Bld: 95 mg/dL (ref 70–99)
Potassium: 3.4 mmol/L — ABNORMAL LOW (ref 3.5–5.1)
Sodium: 140 mmol/L (ref 135–145)
Total Bilirubin: 1 mg/dL (ref 0.3–1.2)
Total Protein: 6.7 g/dL (ref 6.5–8.1)

## 2020-01-29 MED ORDER — PROCHLORPERAZINE MALEATE 10 MG PO TABS
10.0000 mg | ORAL_TABLET | Freq: Once | ORAL | Status: AC
  Administered 2020-01-29: 10 mg via ORAL
  Filled 2020-01-29: qty 1

## 2020-01-29 MED ORDER — PEGFILGRASTIM 6 MG/0.6ML ~~LOC~~ PSKT
6.0000 mg | PREFILLED_SYRINGE | Freq: Once | SUBCUTANEOUS | Status: AC
  Administered 2020-01-29: 6 mg via SUBCUTANEOUS
  Filled 2020-01-29: qty 0.6

## 2020-01-29 MED ORDER — SODIUM CHLORIDE 0.9% FLUSH
10.0000 mL | INTRAVENOUS | Status: DC | PRN
  Administered 2020-01-29: 09:00:00 10 mL
  Filled 2020-01-29: qty 10

## 2020-01-29 MED ORDER — SODIUM CHLORIDE 0.9 % IV SOLN
Freq: Once | INTRAVENOUS | Status: AC
  Filled 2020-01-29: qty 250

## 2020-01-29 MED ORDER — HEPARIN SOD (PORK) LOCK FLUSH 100 UNIT/ML IV SOLN
500.0000 [IU] | Freq: Once | INTRAVENOUS | Status: AC | PRN
  Administered 2020-01-29: 500 [IU]
  Filled 2020-01-29: qty 5

## 2020-01-29 MED ORDER — HEPARIN SOD (PORK) LOCK FLUSH 100 UNIT/ML IV SOLN
INTRAVENOUS | Status: AC
  Filled 2020-01-29: qty 5

## 2020-01-29 MED ORDER — SODIUM CHLORIDE 0.9 % IV SOLN
1800.0000 mg | Freq: Once | INTRAVENOUS | Status: AC
  Administered 2020-01-29: 1800 mg via INTRAVENOUS
  Filled 2020-01-29: qty 26.3

## 2020-01-29 NOTE — Patient Instructions (Signed)
#   Dr.Shah- neurology/[facility]- please call his office re: EEG scheduling  # Use Miralax for constipation

## 2020-01-29 NOTE — Progress Notes (Signed)
[facility] OFFICE PROGRESS NOTE  Patient Care Team: Delsa Grana, PA-C as PCP - General (Family Medicine) Wellington Hampshire, MD as PCP - Cardiology (Cardiology) Bary Castilla Forest Gleason, MD (General Surgery) Vladimir Crofts, MD as Consulting Physician (Neurology)  Cancer Staging No matching staging information was found for the patient.   Oncology History Overview Note  # LEFT BREAST IDC; STAGE II [cT2N1] ER >90%; PR- 50-90%; her 2 NEU- POS; s/p Neoadj chemo; AUG 2016- TCH+P s/p Lumpec & partial ALND- path CR; s/p RT [finished Nov 2016];  adj Herceptin; HELD for Jan 19th 2017 [in DEC 2016-EF dropped from 63 to 51%; FEB 27th EF-42.9%]; JAN 2016 START Arimidex; May 2017- EF- 60%; May 24th 2017-Re-start Herceptin q 3W; July 28th STOP herceptin [finished 31m/m2; sec to Low EF; however 2017 Oct EF= 67%; improved]  # DEC 4th 2017- Start Neratinib 4 pills; DEC 11th 3 pills; STOPPED.  # FEB 2018- RECURRENCE ER/PR positive; Her 2 NEGATIVE [liver Bx]  # MARCH 1st 2018- Tax-Cytoxan [poor tol]  # FEB 2018- Brain mets [SBRT; Dr.Crystal; finished Feb 28th 2018]  # March 2018- Eribulin- poor tolerance  # June 8th 2018- Started Faslodex + Abema [abema-multiple interruptions sec to neutropenia]  # MRI Brain- March 2019- Leptomeningeal mets [WBRT; No LP s/p WBRT- 01/09/2018]; STOP Abema [551mday-sec to severe cytopenia]+faslodex  # MAY 20tth 019- Start Xeloda; STOPPED in Nov 22nd 2019- sec to Progressive liver lesions on CT scan  # NOV 26th 2019- Faslodex + Piqaray 25065m; Jan 2020- Piqray 300 mg+ faslodex; FEB 17th CT scan- mixed response/ Overall progression; STOP Piqray + faslodex.   # FEB 25th 2020- ERIBULIN; Poor tolerance sec to severe neutropenia.  Stopped May 2020.  CT scan May 2020-new/progressive 2 cm liver lesion;   #July nd 2020-Ixempra x5 cycles-; NOV 2020-MRI liver- worsening liver lesions [rising AST/ALT].   # NOV [id number] weekly [consent-]; December  31-2020-progression/rising LFTs.  Stop Taxol  #January 15th, 2021-Adriamycin weekly [C]; MARCH 2021- Partial response. STOP ADRIAMYCIN sec to EF drop 40-45%.   # April 2nd-2021-gemcitabine; 01/15/2020-GEM [800 mg/m2; #3 cycle 1000m51m] q 2 W; with onpro.   #May 06, 2019-MRI brain 5 new lesions [3 mm to 10 mm]-asymptomatic; [previous whole brain radiation]- OCT 2020- Re-Irradiation.   # PN- G-2; may 2017- Cymbalta 60mg67mARCH 2018-  acute vascular insuff of BIL LE [? Taxotere vasoconstriction on asprin]; # hemorragic shock LGIB [s/p colo; Dr.Wohl]  #  SVT- on flecainide.   # April 2016- Liver Bx- NEG;   # Drop in EF from Herceptin [recovered OCt 27th- EF 65%]; March 2020 ejection fraction 40 to 45%.  Stop Adriamycin.  # BMD- jan 2017- osteopenia  # 2020- SEP-s/p Pallaitive care evaluation  -----------------------------------------------------------     [place]- PDL-1 0%/NEG for BRCA; MSI-Stable; Positive for PI3K; ccnd-1; FGF** -----------------------------------------------------------  Dx: Metastatic Breast cancer [ER/PR-Pos; her -2 NEG] Stage IV; goals: palliative Current treatment-gemcitabine chemotherapy [C]   Carcinoma of upper-outer quadrant of left breast in female, estrogen receptor positive (HCC) [place]25/2020 - 01/17/2019 Chemotherapy   The patient had eriBULin mesylate (HALAVEN) 2.1 mg in sodium chloride 0.9 % 100 mL chemo infusion, 1.2 mg/m2 = 2.1 mg (100 % of original dose 1.2 mg/m2), Intravenous,  Once, 3 of 4 cycles Dose modification: 1.2 mg/m2 (original dose 1.2 mg/m2, Cycle 1, Reason: Provider Judgment), 0.8 mg/m2 (original dose 1.2 mg/m2, Cycle 3, Reason: Provider Judgment) Administration: 2.1 mg (11/22/2018), 2.1 mg (11/29/2018), 2.1 mg (12/20/2018),  1.4 mg (01/17/2019)  for chemotherapy treatment.    03/30/2019 - 07/12/2019 Chemotherapy   The patient had pegfilgrastim (NEULASTA ONPRO KIT) injection 6 mg, 6 mg, Subcutaneous,  Once, 5 of 8 cycles Administration: 6 mg (03/30/2019), 6 mg (04/20/2019), 6 mg (05/11/2019), 6 mg (06/22/2019), 6 mg (06/01/2019) ixabepilone (IXEMPRA) 60 mg in lactated ringers 250 mL chemo infusion, 33.5 mg/m2 = 57 mg (100 % of original dose 32 mg/m2), Intravenous,  Once, 5 of 8 cycles Dose modification: 32 mg/m2 (original dose 32 mg/m2, Cycle 1, Reason: Provider Judgment) Administration: 72 mg (04/20/2019), 72 mg (05/11/2019), 72 mg (06/22/2019), 72 mg (06/01/2019)  for chemotherapy treatment.    08/18/2019 - 09/28/2019 Chemotherapy   The patient had PACLitaxel (TAXOL) 150 mg in sodium chloride 0.9 % 250 mL chemo infusion (</= '80mg'$ /m2), 80 mg/m2 = 150 mg, Intravenous,  Once, 1 of 4 cycles Administration: 150 mg (08/18/2019), 150 mg (09/08/2019), 150 mg (09/15/2019)  for chemotherapy treatment.    10/13/2019 - 11/30/2019 Chemotherapy   The patient had DOXOrubicin (ADRIAMYCIN) chemo injection 38 mg, 20 mg/m2 = 38 mg, Intravenous,  Once, 6 of 9 cycles Administration: 38 mg (10/13/2019), 38 mg (10/20/2019), 38 mg (10/27/2019), 38 mg (11/17/2019), 38 mg (11/24/2019), 38 mg (11/10/2019) palonosetron (ALOXI) injection 0.25 mg, 0.25 mg, Intravenous,  Once, 6 of 9 cycles Administration: 0.25 mg (10/13/2019), 0.25 mg (10/20/2019), 0.25 mg (10/27/2019), 0.25 mg (11/17/2019), 0.25 mg (11/24/2019), 0.25 mg (11/10/2019)  for chemotherapy treatment.    12/29/2019 -  Chemotherapy   The patient had pegfilgrastim (NEULASTA ONPRO KIT) injection 6 mg, 6 mg, Subcutaneous, Once, 1 of 3 cycles Administration: 6 mg (01/15/2020) gemcitabine (GEMZAR) 1,444 mg in sodium chloride 0.9 % 250 mL chemo infusion, 800 mg/m2 = 1,444 mg, Intravenous,  Once, 2 of 4 cycles Dose modification: 1,000 mg/m2 (original dose 800 mg/m2, Cycle 3, Reason: Provider Judgment) Administration: 1,444 mg (12/29/2019), 1,444 mg (01/15/2020)  for chemotherapy treatment.      INTERVAL HISTORY:  Sarah Horne 64 y.o.  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  Patient denies any fevers or chills.  Denies any skin rash.  Patient continues to have intermittent constipation.  Patient has not started MiraLAX.  No blood in stools or black or stools no abdominal pain.  No falls.  Review of Systems  Constitutional: Positive for malaise/fatigue. Negative for chills, diaphoresis, fever and weight loss.  HENT: Negative for nosebleeds and sore throat.   Eyes: Negative for double vision.  Respiratory: Negative for cough, hemoptysis, sputum production, shortness of breath and wheezing.   Cardiovascular: Negative for chest pain, palpitations, orthopnea and leg swelling.  Gastrointestinal: Positive for constipation. Negative for abdominal pain, blood in stool, heartburn, melena, nausea and vomiting.  Genitourinary: Negative for dysuria, frequency and urgency.  Musculoskeletal: Positive for joint pain. Negative for back pain.  Skin: Negative for itching.  Neurological: Positive for dizziness and tingling. Negative for focal weakness, weakness and headaches.  Endo/Heme/Allergies: Does not bruise/bleed easily.  Psychiatric/Behavioral: Negative for depression. The patient is not nervous/anxious and does not have insomnia.     PAST MEDICAL HISTORY :  Past Medical History:  Diagnosis Date  . Chemotherapy-induced peripheral neuropathy ([place])   . Epistaxis    a. 11/2016 in setting of asa/plavix-->silver nitrate cauterization.  . GI bleed    a. 11/2016 Admission w/ GIB and hypovolemic shock req 3u PRBC's;  b. 11/2016 ECG: gastritis &  nonbleeding peptic ulcer; c. 11/2016 Conlonoscopy: rectal and sigmoid colonic ulcers.  . Heart attack ([place])    a. 1998 Cath @ [facility]: reportedly no intervention required.  Marland Kitchen Herceptin-induced cardiomyopathy ([place])    a. In the setting of Herceptin Rx for breast cancer (initiated 12/2014); b. 03/2015 MUGA EF 64%; b. 08/2015  MUGA: EF 51%; c. 10/2015 MUGA: EF 44%; d. 11/2015 Echo: EF 45-50%; e. 01/2016 MUGA: EF 60%; f. 06/2016 MUGA EF 65%; g. 10/2016 MUGA: EF 61%;  h. 12/2016 Echo: EF 55-60%, gr1 DD.  Marland Kitchen Hyperlipidemia   . Hypertension   . Neuropathy   . Possible PAD (peripheral artery disease) ([place])    a. 11/2016 LE cyanosis and weak pulses-->CTA w/o significant Ao-BiFem dzs. ? distal dzs-->ASA/Plavix initiated by vascular surgery.  Marland Kitchen PSVT (paroxysmal supraventricular tachycardia) ([place])    a. Dx 11/2016.  . Pulmonary embolism ([place])    a. 12/2016 CTA Chest: small nonocclusive PE in inferior segment of the Left lingula, somewhat eccentric filling defect suggesting chronic rather than acute embolic event; b. 03/16 LE U/S:  No DVT; c. 12/2016 Echo: Nl RV fxn, nl PASP.  Marland Kitchen Recurrent Metastatic breast cancer ([place])    a. Dx 2016: Stage II, ER positive, PR positive, HER-2/neu overexpressing of the left breast-->chemo/radiation; b. 10/2016 CT Abd/pelvis: diffuse liver mets, ill defined sclerotic bone lesions-T12;  c. 10/2016 MRI brain: metastatic lesion along L temporal lobe (19x71m) w/ extensive surrounding edema & 532mmidline shift to right.  . Sepsis ([place]  . Sinus tachycardia     PAST SURGICAL HISTORY :   Past Surgical History:  Procedure Laterality Date  . BREAST BIOPSY Left 2016   Positive  . BREAST LUMPECTOMY WITH SENTINEL LYMPH NODE BIOPSY Left 05/23/2015   Procedure: LEFT BREAST WIDE EXCISION WITH AXILLARY DISSECTION, MASTOPLASTY ;  Surgeon: JeRobert BellowMD;  Location: [facility] ORS;  Service: General;  Laterality: Left;  . BREAST SURGERY Left 12/18/14   breast biopsy/INVASIVE DUCTAL CARCINOMA OF BREAST, NOTTINGHAM GRADE 2.  . Marland KitchenREAST SURGERY  05/23/2015.   Wide excision/mastoplasty, axillary dissection. No residual invasive cancer, positive for residual DCIS. 0/2 nodes identified on axillary dissection. (no SLN by technetium or methylene blue)  . CARDIAC CATHETERIZATION    . COLONOSCOPY WITH PROPOFOL N/A 12/10/2016    Procedure: COLONOSCOPY WITH PROPOFOL;  Surgeon: DaLucilla LameMD;  Location: [facility] ENDOSCOPY;  Service: Endoscopy;  Laterality: N/A;  . COLONOSCOPY WITH PROPOFOL N/A 02/13/2017   Procedure: COLONOSCOPY WITH PROPOFOL;  Surgeon: WoLucilla LameMD;  [facility];  Service: Endoscopy;  Laterality: N/A;  . ESOPHAGOGASTRODUODENOSCOPY N/A 11/03/2019   Procedure: ESOPHAGOGASTRODUODENOSCOPY (EGD);  Surgeon: VaLin LandsmanMD;  [facility];  Service: Gastroenterology;  Laterality: N/A;  . ESOPHAGOGASTRODUODENOSCOPY (EGD) WITH PROPOFOL N/A 12/08/2016   Procedure: ESOPHAGOGASTRODUODENOSCOPY (EGD) WITH PROPOFOL;  Surgeon: DaLucilla LameMD;  Location: [facility];  Service: Endoscopy;  Laterality: N/A;  . IVC FILTER INSERTION N/A 02/15/2017   Procedure: IVC Filter Insertion;  Surgeon: DeAlgernon HuxleyMD;  [facility];  Service: Cardiovascular;  Laterality: N/A;  . PORTACATH PLACEMENT Right 12-31-14   Dr ByBary Castilla  FAMILY HISTORY :   Family History  Problem Relation Age of Onset  . Breast cancer Maternal Aunt   . Breast cancer Cousin   . Brain cancer Maternal Uncle   . Diabetes Mother   . Hypertension Mother   . Stroke Mother     SOCIAL HISTORY:   Social History   Tobacco  Use  . Smoking status: Light Tobacco Smoker    Packs/day: 0.25    Years: 18.00    Pack years: 4.50    Types: Cigarettes  . Smokeless tobacco: Never Used  Substance Use Topics  . Alcohol use: No    Alcohol/week: 0.0 standard drinks  . Drug use: No    ALLERGIES:  is allergic to no known allergies.  MEDICATIONS:  Current Outpatient Medications  Medication Sig Dispense Refill  . acetaminophen (TYLENOL) 650 MG CR tablet Take 650 mg by mouth every 8 (eight) hours as needed for pain.    Marland Kitchen apixaban (ELIQUIS) 5 MG TABS tablet Take 1 tablet (5 mg total) by mouth 2 (two) times daily. 60 tablet 4  . flecainide (TAMBOCOR) 50 MG tablet Take 1 tablet (50 mg total) by mouth 2 (two) times  daily. 60 tablet 4  . ipratropium (ATROVENT) 0.03 % nasal spray Place 2 sprays into both nostrils every 12 (twelve) hours. 30 mL 12  . levocetirizine (XYZAL) 5 MG tablet TAKE 1 TABLET BY MOUTH EVERY DAY IN THE EVENING 90 tablet 1  . magnesium oxide (MAG-OX) 400 MG tablet Take 1 tablet (400 mg total) by mouth 2 (two) times daily. 60 tablet 5  . meclizine (ANTIVERT) 25 MG tablet Take 1 tablet (25 mg total) by mouth 3 (three) times daily as needed for dizziness. 30 tablet 0  . metoprolol tartrate (LOPRESSOR) 25 MG tablet TAKE 1 TABLET BY MOUTH TWICE A DAY 60 tablet 1  . montelukast (SINGULAIR) 10 MG tablet Take 1 tablet (10 mg total) by mouth at bedtime. 60 tablet 3  . ondansetron (ZOFRAN) 8 MG tablet One pill every 8 hours as needed for nausea/vomitting. 40 tablet 1  . pregabalin (LYRICA) 100 MG capsule Take 1 capsule (100 mg total) by mouth 2 (two) times daily. 60 capsule 2  . prochlorperazine (COMPAZINE) 10 MG tablet Take 1 tablet (10 mg total) by mouth every 6 (six) hours as needed for nausea or vomiting. 40 tablet 1  . Vitamin D, Ergocalciferol, (DRISDOL) 1.25 MG (50000 UNIT) CAPS capsule TAKE 1 CAPSULE (50,000 UNITS TOTAL) ONCE A WEEK BY MOUTH. 12 capsule 1   No current facility-administered medications for this visit.   Facility-Administered Medications Ordered in Other Visits  Medication Dose Route Frequency Provider Last Rate Last Admin  . 0.9 %  sodium chloride infusion   Intravenous Continuous [name], MD   New Bag at 12/10/16 1140  . 0.9 %  sodium chloride infusion   Intravenous Continuous Lloyd Huger, MD 999 mL/hr at 04/24/15 1520 New Bag at 05/23/15 0951  . 0.9 %  sodium chloride infusion   Intravenous Continuous Verlon Au, NP 999 mL/hr at 10/31/19 1522 New Bag at 10/31/19 1522  . heparin lock flush 100 unit/mL  500 Units Intravenous Once Berenzon, Dmitriy, MD      . sodium chloride 0.9 % injection 10 mL  10 mL Intravenous PRN [name], MD   10 mL at  04/04/15 1440  . sodium chloride flush (NS) 0.9 % injection 10 mL  10 mL Intravenous PRN Berenzon, Dmitriy, MD        PHYSICAL EXAMINATION: ECOG PERFORMANCE STATUS: 1 - Symptomatic but completely ambulatory  BP 124/88 (BP Location: Right Arm, Patient Position: Sitting, Cuff Size: Normal)   Pulse 94   Temp (!) 94.5 F (34.7 C) (Tympanic)   Wt 151 lb 5 oz (68.6 kg)   BMI 24.42 kg/m   [employer]   01/29/20  0901  Weight: 151 lb 5 oz (68.6 kg)    Physical Exam  Constitutional: She is oriented to person, place, and time and well-developed, well-nourished, and in no distress.  Patient is alone.  In wheel chair.   HENT:  Head: Normocephalic and atraumatic.  Mouth/Throat: Oropharynx is clear and moist. No oropharyngeal exudate.  Eyes: Pupils are equal, round, and reactive to light.  Cardiovascular: Normal rate and regular rhythm.  Pulmonary/Chest: Effort normal and breath sounds normal. No respiratory distress. She has no wheezes.  Abdominal: Soft. Bowel sounds are normal. She exhibits no distension and no mass. There is no abdominal tenderness. There is no rebound and no guarding.  Musculoskeletal:        General: No tenderness. Normal range of motion.     Cervical back: Normal range of motion and neck supple.  Neurological: She is alert and oriented to person, place, and time.  Broad based gait.  Unsteady.  Skin: Skin is warm.  Psychiatric: Affect normal.    LABORATORY DATA:  I have reviewed the data as listed    Component Value Date/Time   NA 140 01/29/2020 0836   NA 135 03/04/2017 0918   NA 135 01/08/2015 0850   K 3.4 (L) 01/29/2020 0836   K 3.7 01/08/2015 0850   CL 103 01/29/2020 0836   CL 101 01/08/2015 0850   CO2 27 01/29/2020 0836   CO2 26 01/08/2015 0850   GLUCOSE 95 01/29/2020 0836   GLUCOSE 161 (H) 01/08/2015 0850   BUN 10 01/29/2020 0836   BUN 8 03/04/2017 0918   BUN 17 01/08/2015 0850   CREATININE 0.82 01/29/2020 0836   CREATININE 0.82 01/22/2015 1559    CALCIUM 8.8 (L) 01/29/2020 0836   CALCIUM 9.3 01/08/2015 0850   PROT 6.7 01/29/2020 0836   PROT 6.4 03/04/2017 0918   PROT 6.8 01/22/2015 1559   ALBUMIN 3.4 (L) 01/29/2020 0836   ALBUMIN 2.5 (L) 03/04/2017 0918   ALBUMIN 3.9 01/22/2015 1559   AST 48 (H) 01/29/2020 0836   AST 34 01/22/2015 1559   ALT 28 01/29/2020 0836   ALT 42 01/22/2015 1559   ALKPHOS 224 (H) 01/29/2020 0836   ALKPHOS 157 (H) 01/22/2015 1559   BILITOT 1.0 01/29/2020 0836   BILITOT 6.2 (H) 03/04/2017 0918   BILITOT 0.2 (L) 01/22/2015 1559   GFRNONAA >60 01/29/2020 0836   GFRNONAA >60 01/22/2015 1559   GFRAA >60 01/29/2020 0836   GFRAA >60 01/22/2015 1559    No results found for: SPEP, UPEP  Lab Results  Component Value Date   WBC 5.0 01/29/2020   NEUTROABS 3.8 01/29/2020   HGB 13.4 01/29/2020   HCT 41.6 01/29/2020   MCV 96.3 01/29/2020   PLT 132 (L) 01/29/2020      Chemistry      Component Value Date/Time   NA 140 01/29/2020 0836   NA 135 03/04/2017 0918   NA 135 01/08/2015 0850   K 3.4 (L) 01/29/2020 0836   K 3.7 01/08/2015 0850   CL 103 01/29/2020 0836   CL 101 01/08/2015 0850   CO2 27 01/29/2020 0836   CO2 26 01/08/2015 0850   BUN 10 01/29/2020 0836   BUN 8 03/04/2017 0918   BUN 17 01/08/2015 0850   CREATININE 0.82 01/29/2020 0836   CREATININE 0.82 01/22/2015 1559      Component Value Date/Time   CALCIUM 8.8 (L) 01/29/2020 0836   CALCIUM 9.3 01/08/2015 0850   ALKPHOS 224 (H) 01/29/2020 0836   ALKPHOS  157 (H) 01/22/2015 1559   AST 48 (H) 01/29/2020 0836   AST 34 01/22/2015 1559   ALT 28 01/29/2020 0836   ALT 42 01/22/2015 1559   BILITOT 1.0 01/29/2020 0836   BILITOT 6.2 (H) 03/04/2017 0918   BILITOT 0.2 (L) 01/22/2015 1559       RADIOGRAPHIC STUDIES: I have personally reviewed the radiological images as listed and agreed with the findings in the report. No results found.   ASSESSMENT & PLAN:  Carcinoma of upper-outer quadrant of left breast in female, estrogen receptor  positive ([place]) # Recurrent metastatic breast cancer-lung/liver metastases ER PR positive HER-2 negative; CT scan March second 2021-partial response noted-improved liver lesions; lung lesion.  No new lesions.  Currently gemcitabine single agent.  # proceed with gemcitabine #3 every 2 weeks[ sec to neutropenia/on pro].  Labs today reviewed;  acceptable for treatment today.   # Constipation-STABLE; continue miralax BID prn; increase fluid intake/fibre.   #Gait instability/visual disturbance-[Dr.Shah]-clinically- STABLE.   Recommend follow-up regarding EEG.  # cardiomyopathy/ Sinus tachycardia- on metoprolol-/Tamabcor; [march 2d-echo- 40-45%]; stable.   # Elevated LFTs-likely secondary to progressive malignancy- STABLE.   # Peripheral neuropathy grade 1-2-  on Lyrica; STABLE.    # Right lower extremity DVT bilateral-November 28, 2019-STABLE;  on eliquis.   # DISPOSITION:  # gemcitabine/onprpo today;  # 2 weeks MD; labs- cbc/cmp;GEM- onpro-Dr.B     No orders of the defined types were placed in this encounter.  All questions were answered. The patient knows to call the clinic with any problems, questions or concerns.     Cammie Sickle, MD 01/29/2020 9:39 AM

## 2020-01-29 NOTE — Assessment & Plan Note (Addendum)
#  Recurrent metastatic breast cancer-lung/liver metastases ER PR positive HER-2 negative; CT scan March second 2021-partial response noted-improved liver lesions; lung lesion.  No new lesions.  Currently gemcitabine single agent.  # proceed with gemcitabine #3 every 2 weeks[ sec to neutropenia/on pro].  Labs today reviewed;  acceptable for treatment today.   # Constipation-STABLE; continue miralax BID prn; increase fluid intake/fibre.   #Gait instability/visual disturbance-[Dr.Shah]-clinically- STABLE.   Recommend follow-up regarding EEG.  # cardiomyopathy/ Sinus tachycardia- on metoprolol-/Tamabcor; [march 2d-echo- 40-45%]; stable.   # Elevated LFTs-likely secondary to progressive malignancy- STABLE.   # Peripheral neuropathy grade 1-2-  on Lyrica; STABLE.    # Right lower extremity DVT bilateral-November 28, 2019-STABLE;  on eliquis.   # DISPOSITION:  # gemcitabine/onprpo today;  # 2 weeks MD; labs- cbc/cmp;GEM- onpro-Dr.B

## 2020-01-30 ENCOUNTER — Other Ambulatory Visit: Payer: Medicare HMO | Admitting: Nurse Practitioner

## 2020-01-30 ENCOUNTER — Encounter: Payer: Self-pay | Admitting: Nurse Practitioner

## 2020-01-30 DIAGNOSIS — C50919 Malignant neoplasm of unspecified site of unspecified female breast: Secondary | ICD-10-CM

## 2020-01-30 DIAGNOSIS — Z515 Encounter for palliative care: Secondary | ICD-10-CM

## 2020-01-30 NOTE — Progress Notes (Signed)
[facility] Consult Note Telephone: [phone]  Fax: [phone]  PATIENT NAME: Sarah Horne DOB: [birth year] MRN: [id number]  PRIMARY CARE PROVIDER:   Delsa Grana, PA-C  REFERRING PROVIDER:  Delsa Grana, PA-C [address] [place],  [place] [address]  RESPONSIBLE PARTY:   Self  Due to the COVID-19 crisis, this visit was done via telemedicine from my office and it was initiated and consent by this patient and or family.  I was asked by Delsa Grana PA to see Sarah Horne for Palliative care consult for goals of care   RECOMMENDATIONS and PLAN:  1. ACP: DNR; MOST form completed in [facility]; DNR/Limited interventions, wishes for antibiotics, IVF, no feeding tube, continue with chemotherapy   2. Palliative care encounter; Palliative medicine team will continue to support patient, patient's family, and medical team. Visit consisted of counseling and education dealing with the complex and emotionally intense issues of symptom management and palliative care in the setting of serious and potentially life-threatening illness  I connected with on 01/30/2020 at 9:00am EDT by telephone and verified that I am speaking with the correct person using two identifiers.  I spent 60 minutes providing this consultation,  from 9:00am to 10:00am. More than 50% of the time in this consultation was spent coordinating communication.   HISTORY OF PRESENT ILLNESS:  Sarah Horne is a 64 y.o. year old female with multiple medical problems including Metastatic breast with brain metastases, pulmonary embolism, proximal supraventricular tachycardia, Myocardial infarction, cardiomyopathy secondary to herceptin, hypertension, hyperlipidemia, peripheral artery disease, peripheral neuropathy secondary to chemotherapy, history of GI bleed, gastritis, do I denied [place],  benign neoplasm of transverse and descending colon, nuclear sclerotic cataract of left eye,  leukopenia  due to antineoplastic chemotherapy port-a-cath placement, IVC filter insertion, lumpectomy with Sentinel lymph node biopsy. Hospitalized 1/6 / 2021 to 1/8 / 2021 for soreness after sustaining of fall camo images negative for acute fracture, received a dehydration. Hospitalized 2 / 3 / 2021 to 2 / 9 / 2021 for acute deep vein thrombosis of femoral vein of right lower extremity edema carcinoma of upper outer quadrant of left breast estrogen receptor-positive. She was treated with IV Heparin infusion, vascular consulted and thrombectomy considered however thrombectomy was aborted because MRI of brain shows metastasis. She was switched to Eliquis. Complained of dysphasia and barium swallow revealed cervical esophageal webs with GI consulted, EGD with esophageal dilatation and looks down 2 / 4 / 2021 with improvement of dysphasia. She was discharged to short-term rehab then home where she currently resides. 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 11/2019 partial response noted improve liver lesion. No new lesions. I called Sarah Horne for schedule telemedicine telephonic as video not available for initial palliative care visit. We talked about purpose of palliative care visit. Sarah Horne an agreement. We talked about how she was feeling today you. Sarah Horne endorses that she's doing okay. She was just getting her day started. We talked about symptoms of pain but she does not experience. We talked about shortness of breath. We talked about chronic disease and progression. We talked about Life review. We talked about family dynamics that she has two sons and five grandchildren. She is divorced. We talked about her work history and when she was initially diagnosed with cancer. Sarah Horne endorses that she actually continue to work when she initiated chemotherapy. Sarah Horne endorses  she has been on multiple  different types of chemotherapy. Sarah Horne denies side effects at present time. We talked about her appointment with Dr. Grayland Ormond. We talked about medical goals of care, treat what is treatable continue chemotherapy. We talked about role of palliative care and plan of care. Discuss follow-up palliative care visit 4 weeks if needed or sooner should she declined. Sarah. Horne in agreement. Appointment scheduled. Therapeutic listening and emotional support provided. Contact information. Question answer to satisfaction. Palliative Care was asked to help address goals of care.   CODE STATUS: DNR  PPS: 50% HOSPICE ELIGIBILITY/DIAGNOSIS: TBD  PAST MEDICAL HISTORY:  Past Medical History:  Diagnosis Date  . Chemotherapy-induced peripheral neuropathy ([place])   . Epistaxis    a. 11/2016 in setting of asa/plavix-->silver nitrate cauterization.  . GI bleed    a. 11/2016 Admission w/ GIB and hypovolemic shock req 3u PRBC's;  b. 11/2016 ECG: gastritis & nonbleeding peptic ulcer; c. 11/2016 Conlonoscopy: rectal and sigmoid colonic ulcers.  . Heart attack ([place])    a. 1998 Cath @ [facility]: reportedly no intervention required.  Marland Kitchen Herceptin-induced cardiomyopathy ([place])    a. In the setting of Herceptin Rx for breast cancer (initiated 12/2014); b. 03/2015 MUGA EF 64%; b. 08/2015 MUGA: EF 51%; c. 10/2015 MUGA: EF 44%; d. 11/2015 Echo: EF 45-50%; e. 01/2016 MUGA: EF 60%; f. 06/2016 MUGA EF 65%; g. 10/2016 MUGA: EF 61%;  h. 12/2016 Echo: EF 55-60%, gr1 DD.  Marland Kitchen Hyperlipidemia   . Hypertension   . Neuropathy   . Possible PAD (peripheral artery disease) ([place])    a. 11/2016 LE cyanosis and weak pulses-->CTA w/o significant Ao-BiFem dzs. ? distal dzs-->ASA/Plavix initiated by vascular surgery.  Marland Kitchen PSVT (paroxysmal supraventricular tachycardia) ([place])    a. Dx 11/2016.  . Pulmonary embolism ([place])    a. 12/2016 CTA Chest: small nonocclusive PE in inferior segment of the Left lingula, somewhat eccentric filling defect suggesting chronic rather  than acute embolic event; b. 03/5169 LE U/S:  No DVT; c. 12/2016 Echo: Nl RV fxn, nl PASP.  Marland Kitchen Recurrent Metastatic breast cancer ([place])    a. Dx 2016: Stage II, ER positive, PR positive, HER-2/neu overexpressing of the left breast-->chemo/radiation; b. 10/2016 CT Abd/pelvis: diffuse liver mets, ill defined sclerotic bone lesions-T12;  c. 10/2016 MRI brain: metastatic lesion along L temporal lobe (19x44m) w/ extensive surrounding edema & 567mmidline shift to right.  . [place]  . Sinus tachycardia     SOCIAL HX:  Social History   Tobacco Use  . Smoking status: Light Tobacco Smoker    Packs/day: 0.25    Years: 18.00    Pack years: 4.50    Types: Cigarettes  . Smokeless tobacco: Never Used  Substance Use Topics  . Alcohol use: No    Alcohol/week: 0.0 standard drinks    ALLERGIES:  Allergies  Allergen Reactions  . No Known Allergies      PERTINENT MEDICATIONS:  Outpatient Encounter Medications as of 01/30/2020  Medication Sig  . acetaminophen (TYLENOL) 650 MG CR tablet Take 650 mg by mouth every 8 (eight) hours as needed for pain.  . Marland Kitchenpixaban (ELIQUIS) 5 MG TABS tablet Take 1 tablet (5 mg total) by mouth 2 (two) times daily.  . flecainide (TAMBOCOR) 50 MG tablet Take 1 tablet (50 mg total) by mouth 2 (two) times daily.  . Marland Kitchenpratropium (ATROVENT) 0.03 % nasal spray Place 2 sprays into both nostrils every 12 (twelve) hours.  . Marland Kitchenevocetirizine (XYZAL) 5 MG tablet  TAKE 1 TABLET BY MOUTH EVERY DAY IN THE EVENING  . magnesium oxide (MAG-OX) 400 MG tablet Take 1 tablet (400 mg total) by mouth 2 (two) times daily.  . meclizine (ANTIVERT) 25 MG tablet Take 1 tablet (25 mg total) by mouth 3 (three) times daily as needed for dizziness.  . metoprolol tartrate (LOPRESSOR) 25 MG tablet TAKE 1 TABLET BY MOUTH TWICE A DAY  . montelukast (SINGULAIR) 10 MG tablet Take 1 tablet (10 mg total) by mouth at bedtime.  . ondansetron (ZOFRAN) 8 MG tablet One pill every 8 hours as needed for nausea/vomitting.    . pregabalin (LYRICA) 100 MG capsule Take 1 capsule (100 mg total) by mouth 2 (two) times daily.  . prochlorperazine (COMPAZINE) 10 MG tablet Take 1 tablet (10 mg total) by mouth every 6 (six) hours as needed for nausea or vomiting.  . Vitamin D, Ergocalciferol, (DRISDOL) 1.25 MG (50000 UNIT) CAPS capsule TAKE 1 CAPSULE (50,000 UNITS TOTAL) ONCE A WEEK BY MOUTH.   Facility-Administered Encounter Medications as of 01/30/2020  Medication  . 0.9 %  sodium chloride infusion  . 0.9 %  sodium chloride infusion  . 0.9 %  sodium chloride infusion  . heparin lock flush 100 unit/mL  . sodium chloride 0.9 % injection 10 mL  . sodium chloride flush (NS) 0.9 % injection 10 mL  . [DISCONTINUED] sodium chloride flush (NS) 0.9 % injection 10 mL    PHYSICAL EXAM:  deferred  Thom Ollinger Ihor Gully, NP

## 2020-01-31 ENCOUNTER — Ambulatory Visit: Payer: Medicare HMO | Attending: Internal Medicine

## 2020-01-31 DIAGNOSIS — C7931 Secondary malignant neoplasm of brain: Secondary | ICD-10-CM | POA: Diagnosis not present

## 2020-01-31 DIAGNOSIS — C787 Secondary malignant neoplasm of liver and intrahepatic bile duct: Secondary | ICD-10-CM | POA: Diagnosis not present

## 2020-01-31 DIAGNOSIS — C182 Malignant neoplasm of ascending colon: Secondary | ICD-10-CM | POA: Diagnosis not present

## 2020-01-31 DIAGNOSIS — D702 Other drug-induced agranulocytosis: Secondary | ICD-10-CM | POA: Diagnosis not present

## 2020-01-31 DIAGNOSIS — I82401 Acute embolism and thrombosis of unspecified deep veins of right lower extremity: Secondary | ICD-10-CM | POA: Diagnosis not present

## 2020-01-31 DIAGNOSIS — Z23 Encounter for immunization: Secondary | ICD-10-CM

## 2020-01-31 DIAGNOSIS — T451X5D Adverse effect of antineoplastic and immunosuppressive drugs, subsequent encounter: Secondary | ICD-10-CM | POA: Diagnosis not present

## 2020-01-31 DIAGNOSIS — G620 Drug-induced polyneuropathy: Secondary | ICD-10-CM | POA: Diagnosis not present

## 2020-01-31 DIAGNOSIS — C50412 Malignant neoplasm of upper-outer quadrant of left female breast: Secondary | ICD-10-CM | POA: Diagnosis not present

## 2020-01-31 DIAGNOSIS — I10 Essential (primary) hypertension: Secondary | ICD-10-CM | POA: Diagnosis not present

## 2020-01-31 NOTE — Progress Notes (Signed)
   Covid-19 Vaccination Clinic  Name:  Sarah Horne    MRN: [id number] DOB: June 12, 1956  01/31/2020  [name] was observed post Covid-19 immunization for 15 minutes without incident. She was provided with Vaccine Information Sheet and instruction to access the V-Safe system.   [name] was instructed to call 911 with any severe reactions post vaccine: Marland Kitchen Difficulty breathing  . Swelling of face and throat  . A fast heartbeat  . A bad rash all over body  . Dizziness and weakness   Immunizations Administered    Name Date Dose VIS Date Route   Pfizer COVID-19 Vaccine 01/31/2020  3:50 PM 0.3 mL 11/22/2018 Intramuscular   Manufacturer: [employer]   Lot: [id number]   Miranda: [id number]

## 2020-02-02 ENCOUNTER — Inpatient Hospital Stay (HOSPITAL_BASED_OUTPATIENT_CLINIC_OR_DEPARTMENT_OTHER): Payer: Medicare HMO | Admitting: Hospice and Palliative Medicine

## 2020-02-02 ENCOUNTER — Other Ambulatory Visit: Payer: Self-pay

## 2020-02-02 DIAGNOSIS — C50412 Malignant neoplasm of upper-outer quadrant of left female breast: Secondary | ICD-10-CM

## 2020-02-02 DIAGNOSIS — Z515 Encounter for palliative care: Secondary | ICD-10-CM | POA: Diagnosis not present

## 2020-02-02 DIAGNOSIS — Z170 Estrogen receptor positive status [ER+]: Secondary | ICD-10-CM | POA: Diagnosis not present

## 2020-02-02 NOTE — Progress Notes (Signed)
Virtual Visit via Telephone Note  I connected with Sarah Horne on 02/02/20 at 10:30 AM EDT by telephone and verified that I am speaking with the correct person using two identifiers.   I discussed the limitations, risks, security and privacy concerns of performing an evaluation and management service by telephone and the availability of in person appointments. I also discussed with the patient that there may be a patient responsible charge related to this service. The patient expressed understanding and agreed to proceed.   History of Present Illness: Sarah Horne is a 64 y.o. female with multiple medical problems including stage IV breast cancer with leptomeningeal and liver metastases who has had disease progression on multiple previous lines of treatment. MRI of the brain on 05/06/2019 showeda several new metastatic lesions. MRI of the brain on 07/06/2019 also showed evidence of disease progression with enlarging existing lesions and note of interval new metastatic lesions. MRI of the brain 12/15/2019 revealed stable disease.  Patient was most recently rotated to gemcitabine. Patient was referred to palliative care to help address goals and manage ongoing symptoms.   Observations/Objective: Spoke with patient by phone.  She reports that she is doing reasonably well.  She denies any significant changes or concerns today.  No distressing symptoms are reported.  She does have chronic peripheral neuropathy but it is unchanged.  She has managed with daily Lyrica.  She denies any changes performance status or recent falls.  She says appetite is adequate but she has had some recent weight loss.  She has upcoming appointment to speak with the dietitian.  I did discuss use of oral supplements and to focus on high-protein and high-calorie foods.  Assessment and Plan: Stage IV breast cancer -on treatment with gemcitabine.  Followed by Dr. Rogue Bussing  Peripheral neuropathy -stable on Lyrica  Weight  loss -upcoming appointment with RD.  Recommended starting oral supplements twice daily and focusing on high-protein/high-calorie foods  Follow Up Instructions: Follow-up virtual visit in 1 month   I discussed the assessment and treatment plan with the patient. The patient was provided an opportunity to ask questions and all were answered. The patient agreed with the plan and demonstrated an understanding of the instructions.   The patient was advised to call back or seek an in-person evaluation if the symptoms worsen or if the condition fails to improve as anticipated.  I provided 5 minutes of non-face-to-face time during this encounter.   Irean Hong, NP

## 2020-02-05 ENCOUNTER — Inpatient Hospital Stay: Payer: Medicare HMO

## 2020-02-05 NOTE — Progress Notes (Signed)

## 2020-02-05 NOTE — Progress Notes (Signed)
Nutrition Follow-up:  Patient with metastatic breast cancer with leptomeningeal disease to brain.  Patient receiving chemotherapy.    Spoke with patient via phone.  Patient reports that appetite is not that good and she has lost some weight.  Reports that she is just coming from getting a hamburger.  Is not able to drive and depends on others to take her grocery shopping and out to eat.  Reports constipation.  Typically just eats breakfast and supper.      Medications: reviewed  Labs: reviewed  Anthropometrics:   Weight 151 lb 5 oz on 5/3 increased from 146 lb on 4/9   NUTRITION DIAGNOSIS: Inadequate oral intake continues   INTERVENTION:  Encouraged patient to try carnation breakfast essentials mixed with milk for added calories.  Patient reports that she will try and eat more to increase her weight. Patient has contact information  MONITORING, EVALUATION, GOAL: weight trends, intake   NEXT VISIT: as needed  Kalisha Keadle B. Zenia Resides, [place], [place] Registered Dietitian [phone] (pager)

## 2020-02-08 DIAGNOSIS — R404 Transient alteration of awareness: Secondary | ICD-10-CM | POA: Diagnosis not present

## 2020-02-09 NOTE — Progress Notes (Signed)
Patient had a neurology apt on 5/13. She reports that she has not had any falls in 3 weeks time.

## 2020-02-12 ENCOUNTER — Other Ambulatory Visit: Payer: Self-pay

## 2020-02-12 ENCOUNTER — Inpatient Hospital Stay: Payer: Medicare HMO

## 2020-02-12 ENCOUNTER — Inpatient Hospital Stay (HOSPITAL_BASED_OUTPATIENT_CLINIC_OR_DEPARTMENT_OTHER): Payer: Medicare HMO | Admitting: Internal Medicine

## 2020-02-12 ENCOUNTER — Encounter: Payer: Self-pay | Admitting: Internal Medicine

## 2020-02-12 DIAGNOSIS — Z5111 Encounter for antineoplastic chemotherapy: Secondary | ICD-10-CM | POA: Diagnosis not present

## 2020-02-12 DIAGNOSIS — Z5189 Encounter for other specified aftercare: Secondary | ICD-10-CM | POA: Diagnosis not present

## 2020-02-12 DIAGNOSIS — C7800 Secondary malignant neoplasm of unspecified lung: Secondary | ICD-10-CM | POA: Diagnosis not present

## 2020-02-12 DIAGNOSIS — R2689 Other abnormalities of gait and mobility: Secondary | ICD-10-CM | POA: Diagnosis not present

## 2020-02-12 DIAGNOSIS — Z170 Estrogen receptor positive status [ER+]: Secondary | ICD-10-CM

## 2020-02-12 DIAGNOSIS — K5900 Constipation, unspecified: Secondary | ICD-10-CM | POA: Diagnosis not present

## 2020-02-12 DIAGNOSIS — C50412 Malignant neoplasm of upper-outer quadrant of left female breast: Secondary | ICD-10-CM

## 2020-02-12 DIAGNOSIS — C7931 Secondary malignant neoplasm of brain: Secondary | ICD-10-CM | POA: Diagnosis not present

## 2020-02-12 DIAGNOSIS — Z95828 Presence of other vascular implants and grafts: Secondary | ICD-10-CM

## 2020-02-12 DIAGNOSIS — Z7189 Other specified counseling: Secondary | ICD-10-CM

## 2020-02-12 DIAGNOSIS — C787 Secondary malignant neoplasm of liver and intrahepatic bile duct: Secondary | ICD-10-CM | POA: Diagnosis not present

## 2020-02-12 LAB — CBC WITH DIFFERENTIAL/PLATELET
Abs Immature Granulocytes: 0.02 10*3/uL (ref 0.00–0.07)
Basophils Absolute: 0 10*3/uL (ref 0.0–0.1)
Basophils Relative: 0 %
Eosinophils Absolute: 0.1 10*3/uL (ref 0.0–0.5)
Eosinophils Relative: 1 %
HCT: 39.2 % (ref 36.0–46.0)
Hemoglobin: 13.1 g/dL (ref 12.0–15.0)
Immature Granulocytes: 0 %
Lymphocytes Relative: 12 %
Lymphs Abs: 0.6 10*3/uL — ABNORMAL LOW (ref 0.7–4.0)
MCH: 32.1 pg (ref 26.0–34.0)
MCHC: 33.4 g/dL (ref 30.0–36.0)
MCV: 96.1 fL (ref 80.0–100.0)
Monocytes Absolute: 0.5 10*3/uL (ref 0.1–1.0)
Monocytes Relative: 9 %
Neutro Abs: 4.3 10*3/uL (ref 1.7–7.7)
Neutrophils Relative %: 78 %
Platelets: 175 10*3/uL (ref 150–400)
RBC: 4.08 MIL/uL (ref 3.87–5.11)
RDW: 19.7 % — ABNORMAL HIGH (ref 11.5–15.5)
WBC: 5.5 10*3/uL (ref 4.0–10.5)
nRBC: 0 % (ref 0.0–0.2)

## 2020-02-12 LAB — COMPREHENSIVE METABOLIC PANEL
ALT: 31 U/L (ref 0–44)
AST: 48 U/L — ABNORMAL HIGH (ref 15–41)
Albumin: 3.4 g/dL — ABNORMAL LOW (ref 3.5–5.0)
Alkaline Phosphatase: 255 U/L — ABNORMAL HIGH (ref 38–126)
Anion gap: 8 (ref 5–15)
BUN: 13 mg/dL (ref 8–23)
CO2: 27 mmol/L (ref 22–32)
Calcium: 8.7 mg/dL — ABNORMAL LOW (ref 8.9–10.3)
Chloride: 103 mmol/L (ref 98–111)
Creatinine, Ser: 0.86 mg/dL (ref 0.44–1.00)
GFR calc Af Amer: >60 mL/min (ref >60)
GFR calc non Af Amer: >60 mL/min (ref >60)
Glucose, Bld: 85 mg/dL (ref 70–99)
Potassium: 3.8 mmol/L (ref 3.5–5.1)
Sodium: 138 mmol/L (ref 135–145)
Total Bilirubin: 1.2 mg/dL (ref 0.3–1.2)
Total Protein: 6.8 g/dL (ref 6.5–8.1)

## 2020-02-12 MED ORDER — HEPARIN SOD (PORK) LOCK FLUSH 100 UNIT/ML IV SOLN
500.0000 [IU] | Freq: Once | INTRAVENOUS | Status: AC | PRN
  Administered 2020-02-12: 500 [IU]
  Filled 2020-02-12: qty 5

## 2020-02-12 MED ORDER — PROCHLORPERAZINE MALEATE 10 MG PO TABS
10.0000 mg | ORAL_TABLET | Freq: Once | ORAL | Status: AC
  Administered 2020-02-12: 10 mg via ORAL
  Filled 2020-02-12: qty 1

## 2020-02-12 MED ORDER — SODIUM CHLORIDE 0.9% FLUSH
10.0000 mL | INTRAVENOUS | Status: DC | PRN
  Administered 2020-02-12: 10 mL via INTRAVENOUS
  Filled 2020-02-12: qty 10

## 2020-02-12 MED ORDER — PEGFILGRASTIM 6 MG/0.6ML ~~LOC~~ PSKT
6.0000 mg | PREFILLED_SYRINGE | Freq: Once | SUBCUTANEOUS | Status: AC
  Administered 2020-02-12: 6 mg via SUBCUTANEOUS
  Filled 2020-02-12: qty 0.6

## 2020-02-12 MED ORDER — SODIUM CHLORIDE 0.9 % IV SOLN
1800.0000 mg | Freq: Once | INTRAVENOUS | Status: AC
  Administered 2020-02-12: 1800 mg via INTRAVENOUS
  Filled 2020-02-12: qty 26.3

## 2020-02-12 MED ORDER — SODIUM CHLORIDE 0.9 % IV SOLN
Freq: Once | INTRAVENOUS | Status: AC
  Filled 2020-02-12: qty 250

## 2020-02-12 MED ORDER — HEPARIN SOD (PORK) LOCK FLUSH 100 UNIT/ML IV SOLN
INTRAVENOUS | Status: AC
  Filled 2020-02-12: qty 5

## 2020-02-12 NOTE — Assessment & Plan Note (Addendum)
#  Recurrent metastatic breast cancer-lung/liver metastases ER PR positive HER-2 negative; CT scan March second 2021-partial response noted-improved liver lesions; lung lesion.  No new lesions.  Currently gemcitabine single agent.  # proceed with gemcitabine #4 every 2 weeks[ sec to neutropenia/on pro].  Labs today reviewed;  acceptable for treatment today. Will plan imaging after 6 cycles of chemo.   #Gait instability/visual disturbance-[Dr.Shah]-clinically- STABLE.   S/p EEG [5/13]- resulst pending.   # cardiomyopathy/ Sinus tachycardia- on metoprolol-/Tamabcor; [march 2d-echo- 40-45%]; STABLE.   # Elevated LFTs-likely secondary to progressive malignancy- STABLE>  # Peripheral neuropathy grade 1-2-  on Lyrica; STABLE.    # Right lower extremity DVT bilateral-November 28, 2019-STABLE;  on eliquis.   # DISPOSITION:  # gemcitabine/onprpo today;  # 2 weeks MD; labs- cbc/cmp;GEM- onpro-Dr.B

## 2020-02-12 NOTE — Progress Notes (Signed)
[facility] OFFICE PROGRESS NOTE  Patient Care Team: Delsa Grana, PA-C as PCP - General (Family Medicine) Wellington Hampshire, MD as PCP - Cardiology (Cardiology) Bary Castilla Forest Gleason, MD (General Surgery) Vladimir Crofts, MD as Consulting Physician (Neurology)  Cancer Staging No matching staging information was found for the patient.   Oncology History Overview Note  # LEFT BREAST IDC; STAGE II [cT2N1] ER >90%; PR- 50-90%; her 2 NEU- POS; s/p Neoadj chemo; AUG 2016- TCH+P s/p Lumpec & partial ALND- path CR; s/p RT [finished Nov 2016];  adj Herceptin; HELD for Jan 19th 2017 [in DEC 2016-EF dropped from 63 to 51%; FEB 27th EF-42.9%]; JAN 2016 START Arimidex; May 2017- EF- 60%; May 24th 2017-Re-start Herceptin q 3W; July 28th STOP herceptin [finished 31m/m2; sec to Low EF; however 2017 Oct EF= 67%; improved]  # DEC 4th 2017- Start Neratinib 4 pills; DEC 11th 3 pills; STOPPED.  # FEB 2018- RECURRENCE ER/PR positive; Her 2 NEGATIVE [liver Bx]  # MARCH 1st 2018- Tax-Cytoxan [poor tol]  # FEB 2018- Brain mets [SBRT; Dr.Crystal; finished Feb 28th 2018]  # March 2018- Eribulin- poor tolerance  # June 8th 2018- Started Faslodex + Abema [abema-multiple interruptions sec to neutropenia]  # MRI Brain- March 2019- Leptomeningeal mets [WBRT; No LP s/p WBRT- 01/09/2018]; STOP Abema [551mday-sec to severe cytopenia]+faslodex  # MAY 20tth 019- Start Xeloda; STOPPED in Nov 22nd 2019- sec to Progressive liver lesions on CT scan  # NOV 26th 2019- Faslodex + Piqaray 25065m; Jan 2020- Piqray 300 mg+ faslodex; FEB 17th CT scan- mixed response/ Overall progression; STOP Piqray + faslodex.   # FEB 25th 2020- ERIBULIN; Poor tolerance sec to severe neutropenia.  Stopped May 2020.  CT scan May 2020-new/progressive 2 cm liver lesion;   #July nd 2020-Ixempra x5 cycles-; NOV 2020-MRI liver- worsening liver lesions [rising AST/ALT].   # NOV [id number] weekly [consent-]; December  31-2020-progression/rising LFTs.  Stop Taxol  #January 15th, 2021-Adriamycin weekly [C]; MARCH 2021- Partial response. STOP ADRIAMYCIN sec to EF drop 40-45%.   # April 2nd-2021-gemcitabine; 01/15/2020-GEM [800 mg/m2; #3 cycle 1000m51m] q 2 W; with onpro.   #May 06, 2019-MRI brain 5 new lesions [3 mm to 10 mm]-asymptomatic; [previous whole brain radiation]- OCT 2020- Re-Irradiation.   # PN- G-2; may 2017- Cymbalta 60mg67mARCH 2018-  acute vascular insuff of BIL LE [? Taxotere vasoconstriction on asprin]; # hemorragic shock LGIB [s/p colo; Dr.Wohl]  #  SVT- on flecainide.   # April 2016- Liver Bx- NEG;   # Drop in EF from Herceptin [recovered OCt 27th- EF 65%]; March 2020 ejection fraction 40 to 45%.  Stop Adriamycin.  # BMD- jan 2017- osteopenia  # 2020- SEP-s/p Pallaitive care evaluation  -----------------------------------------------------------     [place]th [place]- PDL-1 0%/NEG for BRCA; MSI-Stable; Positive for PI3K; ccnd-1; FGF** -----------------------------------------------------------  Dx: Metastatic Breast cancer [ER/PR-Pos; her -2 NEG] Stage IV; goals: palliative Current treatment-gemcitabine chemotherapy [C]   Carcinoma of upper-outer quadrant of left breast in female, estrogen receptor positive (HCC) [place]25/2020 - 01/17/2019 Chemotherapy   The patient had eriBULin mesylate (HALAVEN) 2.1 mg in sodium chloride 0.9 % 100 mL chemo infusion, 1.2 mg/m2 = 2.1 mg (100 % of original dose 1.2 mg/m2), Intravenous,  Once, 3 of 4 cycles Dose modification: 1.2 mg/m2 (original dose 1.2 mg/m2, Cycle 1, Reason: Provider Judgment), 0.8 mg/m2 (original dose 1.2 mg/m2, Cycle 3, Reason: Provider Judgment) Administration: 2.1 mg (11/22/2018), 2.1 mg (11/29/2018), 2.1 mg (12/20/2018),  1.4 mg (01/17/2019)  for chemotherapy treatment.    03/30/2019 - 07/12/2019 Chemotherapy   The patient had pegfilgrastim (NEULASTA ONPRO KIT) injection 6 mg, 6 mg, Subcutaneous,  Once, 5 of 8 cycles Administration: 6 mg (03/30/2019), 6 mg (04/20/2019), 6 mg (05/11/2019), 6 mg (06/22/2019), 6 mg (06/01/2019) ixabepilone (IXEMPRA) 60 mg in lactated ringers 250 mL chemo infusion, 33.5 mg/m2 = 57 mg (100 % of original dose 32 mg/m2), Intravenous,  Once, 5 of 8 cycles Dose modification: 32 mg/m2 (original dose 32 mg/m2, Cycle 1, Reason: Provider Judgment) Administration: 72 mg (04/20/2019), 72 mg (05/11/2019), 72 mg (06/22/2019), 72 mg (06/01/2019)  for chemotherapy treatment.    08/18/2019 - 09/28/2019 Chemotherapy   The patient had PACLitaxel (TAXOL) 150 mg in sodium chloride 0.9 % 250 mL chemo infusion (</= 69m/m2), 80 mg/m2 = 150 mg, Intravenous,  Once, 1 of 4 cycles Administration: 150 mg (08/18/2019), 150 mg (09/08/2019), 150 mg (09/15/2019)  for chemotherapy treatment.    10/13/2019 - 11/30/2019 Chemotherapy   The patient had DOXOrubicin (ADRIAMYCIN) chemo injection 38 mg, 20 mg/m2 = 38 mg, Intravenous,  Once, 6 of 9 cycles Administration: 38 mg (10/13/2019), 38 mg (10/20/2019), 38 mg (10/27/2019), 38 mg (11/17/2019), 38 mg (11/24/2019), 38 mg (11/10/2019) palonosetron (ALOXI) injection 0.25 mg, 0.25 mg, Intravenous,  Once, 6 of 9 cycles Administration: 0.25 mg (10/13/2019), 0.25 mg (10/20/2019), 0.25 mg (10/27/2019), 0.25 mg (11/17/2019), 0.25 mg (11/24/2019), 0.25 mg (11/10/2019)  for chemotherapy treatment.    12/29/2019 -  Chemotherapy   The patient had pegfilgrastim (NEULASTA ONPRO KIT) injection 6 mg, 6 mg, Subcutaneous, Once, 3 of 7 cycles Administration: 6 mg (01/15/2020), 6 mg (01/29/2020), 6 mg (02/12/2020) gemcitabine (GEMZAR) 1,444 mg in sodium chloride 0.9 % 250 mL chemo infusion, 800 mg/m2 = 1,444 mg, Intravenous,  Once, 4 of 8 cycles Dose modification: 1,000 mg/m2 (original dose 800 mg/m2, Cycle 3, Reason: Provider Judgment) Administration: 1,444 mg (12/29/2019), 1,444 mg (01/15/2020), 1,800 mg (01/29/2020), 1,800 mg (02/12/2020)  for chemotherapy treatment.      INTERVAL  HISTORY:  Sarah DOCKERY89y.o.  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  Patient denies any falling.  Denies any bleeding.  No nausea no vomiting.  Appetite is good.  Weight loss.  Review of Systems  Constitutional: Positive for malaise/fatigue. Negative for chills, diaphoresis, fever and weight loss.  HENT: Negative for nosebleeds and sore throat.   Eyes: Negative for double vision.  Respiratory: Negative for cough, hemoptysis, sputum production, shortness of breath and wheezing.   Cardiovascular: Negative for chest pain, palpitations, orthopnea and leg swelling.  Gastrointestinal: Positive for constipation. Negative for abdominal pain, blood in stool, heartburn, melena, nausea and vomiting.  Genitourinary: Negative for dysuria, frequency and urgency.  Musculoskeletal: Positive for joint pain. Negative for back pain.  Skin: Negative for itching.  Neurological: Positive for dizziness and tingling. Negative for focal weakness, weakness and headaches.  Endo/Heme/Allergies: Does not bruise/bleed easily.  Psychiatric/Behavioral: Negative for depression. The patient is not nervous/anxious and does not have insomnia.     PAST MEDICAL HISTORY :  Past Medical History:  Diagnosis Date  . Chemotherapy-induced peripheral neuropathy ([place]   . Epistaxis    a. 11/2016 in setting of asa/plavix-->silver nitrate cauterization.  . GI bleed    a. 11/2016 Admission w/ GIB and hypovolemic shock req 3u PRBC's;  b. 11/2016 ECG: gastritis & nonbleeding peptic ulcer; c. 11/2016 Conlonoscopy:  rectal and sigmoid colonic ulcers.  . Heart attack ([place])    a. 1998 Cath @ [facility]: reportedly no intervention required.  Marland Kitchen Herceptin-induced cardiomyopathy ([place])    a. In the setting of Herceptin Rx for breast cancer (initiated 12/2014); b. 03/2015 MUGA EF 64%; b. 08/2015 MUGA: EF 51%; c.  10/2015 MUGA: EF 44%; d. 11/2015 Echo: EF 45-50%; e. 01/2016 MUGA: EF 60%; f. 06/2016 MUGA EF 65%; g. 10/2016 MUGA: EF 61%;  h. 12/2016 Echo: EF 55-60%, gr1 DD.  Marland Kitchen Hyperlipidemia   . Hypertension   . Neuropathy   . Possible PAD (peripheral artery disease) ([place])    a. 11/2016 LE cyanosis and weak pulses-->CTA w/o significant Ao-BiFem dzs. ? distal dzs-->ASA/Plavix initiated by vascular surgery.  Marland Kitchen PSVT (paroxysmal supraventricular tachycardia) ([place])    a. Dx 11/2016.  . Pulmonary embolism ([place])    a. 12/2016 CTA Chest: small nonocclusive PE in inferior segment of the Left lingula, somewhat eccentric filling defect suggesting chronic rather than acute embolic event; b. 11/74 LE U/S:  No DVT; c. 12/2016 Echo: Nl RV fxn, nl PASP.  Marland Kitchen Recurrent Metastatic breast cancer ([place])    a. Dx 2016: Stage II, ER positive, PR positive, HER-2/neu overexpressing of the left breast-->chemo/radiation; b. 10/2016 CT Abd/pelvis: diffuse liver mets, ill defined sclerotic bone lesions-T12;  c. 10/2016 MRI brain: metastatic lesion along L temporal lobe (19x57m) w/ extensive surrounding edema & 581mmidline shift to right.  . Sepsis ([place]  . Sinus tachycardia     PAST SURGICAL HISTORY :   Past Surgical History:  Procedure Laterality Date  . BREAST BIOPSY Left 2016   Positive  . BREAST LUMPECTOMY WITH SENTINEL LYMPH NODE BIOPSY Left 05/23/2015   Procedure: LEFT BREAST WIDE EXCISION WITH AXILLARY DISSECTION, MASTOPLASTY ;  Surgeon: JeRobert BellowMD;  Location: [facility] ORS;  Service: General;  Laterality: Left;  . BREAST SURGERY Left 12/18/14   breast biopsy/INVASIVE DUCTAL CARCINOMA OF BREAST, NOTTINGHAM GRADE 2.  . Marland KitchenREAST SURGERY  05/23/2015.   Wide excision/mastoplasty, axillary dissection. No residual invasive cancer, positive for residual DCIS. 0/2 nodes identified on axillary dissection. (no SLN by technetium or methylene blue)  . CARDIAC CATHETERIZATION    . COLONOSCOPY WITH PROPOFOL N/A 12/10/2016   Procedure:  COLONOSCOPY WITH PROPOFOL;  Surgeon: DaLucilla LameMD;  Location: [facility] ENDOSCOPY;  Service: Endoscopy;  Laterality: N/A;  . COLONOSCOPY WITH PROPOFOL N/A 02/13/2017   Procedure: COLONOSCOPY WITH PROPOFOL;  Surgeon: WoLucilla LameMD;  [facility];  Service: Endoscopy;  Laterality: N/A;  . ESOPHAGOGASTRODUODENOSCOPY N/A 11/03/2019   Procedure: ESOPHAGOGASTRODUODENOSCOPY (EGD);  Surgeon: VaLin LandsmanMD;  [facility];  Service: Gastroenterology;  Laterality: N/A;  . ESOPHAGOGASTRODUODENOSCOPY (EGD) WITH PROPOFOL N/A 12/08/2016   Procedure: ESOPHAGOGASTRODUODENOSCOPY (EGD) WITH PROPOFOL;  Surgeon: DaLucilla LameMD;  Location: [facility] ENDOSCOPY;  Service: Endoscopy;  Laterality: N/A;  . IVC FILTER INSERTION N/A 02/15/2017   Procedure: IVC Filter Insertion;  Surgeon: DeAlgernon HuxleyMD;  [facility];  Service: Cardiovascular;  Laterality: N/A;  . PORTACATH PLACEMENT Right 12-31-14   Dr ByBary Castilla  FAMILY HISTORY :   Family History  Problem Relation Age of Onset  . Breast cancer Maternal Aunt   . Breast cancer Cousin   . Brain cancer Maternal Uncle   . Diabetes Mother   . Hypertension Mother   . Stroke Mother     SOCIAL HISTORY:   Social History   Tobacco Use  . Smoking status: Light  Tobacco Smoker    Packs/day: 0.25    Years: 18.00    Pack years: 4.50    Types: Cigarettes  . Smokeless tobacco: Never Used  Substance Use Topics  . Alcohol use: No    Alcohol/week: 0.0 standard drinks  . Drug use: No    ALLERGIES:  is allergic to no known allergies.  MEDICATIONS:  Current Outpatient Medications  Medication Sig Dispense Refill  . acetaminophen (TYLENOL) 650 MG CR tablet Take 650 mg by mouth every 8 (eight) hours as needed for pain.    Marland Kitchen apixaban (ELIQUIS) 5 MG TABS tablet Take 1 tablet (5 mg total) by mouth 2 (two) times daily. 60 tablet 4  . flecainide (TAMBOCOR) 50 MG tablet Take 1 tablet (50 mg total) by mouth 2 (two) times daily. 60 tablet  4  . ipratropium (ATROVENT) 0.03 % nasal spray Place 2 sprays into both nostrils every 12 (twelve) hours. 30 mL 12  . levocetirizine (XYZAL) 5 MG tablet TAKE 1 TABLET BY MOUTH EVERY DAY IN THE EVENING 90 tablet 1  . magnesium oxide (MAG-OX) 400 MG tablet Take 1 tablet (400 mg total) by mouth 2 (two) times daily. 60 tablet 5  . meclizine (ANTIVERT) 25 MG tablet Take 1 tablet (25 mg total) by mouth 3 (three) times daily as needed for dizziness. 30 tablet 0  . metoprolol tartrate (LOPRESSOR) 25 MG tablet TAKE 1 TABLET BY MOUTH TWICE A DAY 60 tablet 1  . montelukast (SINGULAIR) 10 MG tablet Take 1 tablet (10 mg total) by mouth at bedtime. 60 tablet 3  . ondansetron (ZOFRAN) 8 MG tablet One pill every 8 hours as needed for nausea/vomitting. 40 tablet 1  . pregabalin (LYRICA) 100 MG capsule Take 1 capsule (100 mg total) by mouth 2 (two) times daily. 60 capsule 2  . prochlorperazine (COMPAZINE) 10 MG tablet Take 1 tablet (10 mg total) by mouth every 6 (six) hours as needed for nausea or vomiting. 40 tablet 1  . Vitamin D, Ergocalciferol, (DRISDOL) 1.25 MG (50000 UNIT) CAPS capsule TAKE 1 CAPSULE (50,000 UNITS TOTAL) ONCE A WEEK BY MOUTH. 12 capsule 1   No current facility-administered medications for this visit.   Facility-Administered Medications Ordered in Other Visits  Medication Dose Route Frequency Provider Last Rate Last Admin  . 0.9 %  sodium chloride infusion   Intravenous Continuous [name], MD   New Bag at 12/10/16 1140  . 0.9 %  sodium chloride infusion   Intravenous Continuous Lloyd Huger, MD 999 mL/hr at 04/24/15 1520 New Bag at 05/23/15 0951  . 0.9 %  sodium chloride infusion   Intravenous Continuous Verlon Au, NP 999 mL/hr at 10/31/19 1522 New Bag at 10/31/19 1522  . heparin lock flush 100 unit/mL  500 Units Intravenous Once Berenzon, Dmitriy, MD      . sodium chloride 0.9 % injection 10 mL  10 mL Intravenous PRN [name], MD   10 mL at 04/04/15 1440  .  sodium chloride flush (NS) 0.9 % injection 10 mL  10 mL Intravenous PRN Berenzon, Dmitriy, MD        PHYSICAL EXAMINATION: ECOG PERFORMANCE STATUS: 1 - Symptomatic but completely ambulatory  BP 108/74 (BP Location: Left Arm, Patient Position: Sitting, Cuff Size: Normal)   Pulse 83   Temp (!) 96.3 F (35.7 C) (Tympanic)   Resp 20   Wt 150 lb (68 kg)   SpO2 100%   BMI 24.21 kg/m   [employer]   02/12/20  0928  Weight: 150 lb (68 kg)    Physical Exam  Constitutional: She is oriented to person, place, and time and well-developed, well-nourished, and in no distress.  Patient is alone.  In wheel chair.   HENT:  Head: Normocephalic and atraumatic.  Mouth/Throat: Oropharynx is clear and moist. No oropharyngeal exudate.  Eyes: Pupils are equal, round, and reactive to light.  Cardiovascular: Normal rate and regular rhythm.  Pulmonary/Chest: Effort normal and breath sounds normal. No respiratory distress. She has no wheezes.  Abdominal: Soft. Bowel sounds are normal. She exhibits no distension and no mass. There is no abdominal tenderness. There is no rebound and no guarding.  Musculoskeletal:        General: No tenderness. Normal range of motion.     Cervical back: Normal range of motion and neck supple.  Neurological: She is alert and oriented to person, place, and time.  Broad based gait.  Unsteady.  Skin: Skin is warm.  Psychiatric: Affect normal.    LABORATORY DATA:  I have reviewed the data as listed    Component Value Date/Time   NA 138 02/12/2020 0845   NA 135 03/04/2017 0918   NA 135 01/08/2015 0850   K 3.8 02/12/2020 0845   K 3.7 01/08/2015 0850   CL 103 02/12/2020 0845   CL 101 01/08/2015 0850   CO2 27 02/12/2020 0845   CO2 26 01/08/2015 0850   GLUCOSE 85 02/12/2020 0845   GLUCOSE 161 (H) 01/08/2015 0850   BUN 13 02/12/2020 0845   BUN 8 03/04/2017 0918   BUN 17 01/08/2015 0850   CREATININE 0.86 02/12/2020 0845   CREATININE 0.82 01/22/2015 1559   CALCIUM  8.7 (L) 02/12/2020 0845   CALCIUM 9.3 01/08/2015 0850   PROT 6.8 02/12/2020 0845   PROT 6.4 03/04/2017 0918   PROT 6.8 01/22/2015 1559   ALBUMIN 3.4 (L) 02/12/2020 0845   ALBUMIN 2.5 (L) 03/04/2017 0918   ALBUMIN 3.9 01/22/2015 1559   AST 48 (H) 02/12/2020 0845   AST 34 01/22/2015 1559   ALT 31 02/12/2020 0845   ALT 42 01/22/2015 1559   ALKPHOS 255 (H) 02/12/2020 0845   ALKPHOS 157 (H) 01/22/2015 1559   BILITOT 1.2 02/12/2020 0845   BILITOT 6.2 (H) 03/04/2017 0918   BILITOT 0.2 (L) 01/22/2015 1559   GFRNONAA >60 02/12/2020 0845   GFRNONAA >60 01/22/2015 1559   GFRAA >60 02/12/2020 0845   GFRAA >60 01/22/2015 1559    No results found for: SPEP, UPEP  Lab Results  Component Value Date   WBC 5.5 02/12/2020   NEUTROABS 4.3 02/12/2020   HGB 13.1 02/12/2020   HCT 39.2 02/12/2020   MCV 96.1 02/12/2020   PLT 175 02/12/2020      Chemistry      Component Value Date/Time   NA 138 02/12/2020 0845   NA 135 03/04/2017 0918   NA 135 01/08/2015 0850   K 3.8 02/12/2020 0845   K 3.7 01/08/2015 0850   CL 103 02/12/2020 0845   CL 101 01/08/2015 0850   CO2 27 02/12/2020 0845   CO2 26 01/08/2015 0850   BUN 13 02/12/2020 0845   BUN 8 03/04/2017 0918   BUN 17 01/08/2015 0850   CREATININE 0.86 02/12/2020 0845   CREATININE 0.82 01/22/2015 1559      Component Value Date/Time   CALCIUM 8.7 (L) 02/12/2020 0845   CALCIUM 9.3 01/08/2015 0850   ALKPHOS 255 (H) 02/12/2020 0845   ALKPHOS 157 (H) 01/22/2015 1559  AST 48 (H) 02/12/2020 0845   AST 34 01/22/2015 1559   ALT 31 02/12/2020 0845   ALT 42 01/22/2015 1559   BILITOT 1.2 02/12/2020 0845   BILITOT 6.2 (H) 03/04/2017 0918   BILITOT 0.2 (L) 01/22/2015 1559       RADIOGRAPHIC STUDIES: I have personally reviewed the radiological images as listed and agreed with the findings in the report. No results found.   ASSESSMENT & PLAN:  Carcinoma of upper-outer quadrant of left breast in female, estrogen receptor positive ([place]) #  Recurrent metastatic breast cancer-lung/liver metastases ER PR positive HER-2 negative; CT scan March second 2021-partial response noted-improved liver lesions; lung lesion.  No new lesions.  Currently gemcitabine single agent.  # proceed with gemcitabine #4 every 2 weeks[ sec to neutropenia/on pro].  Labs today reviewed;  acceptable for treatment today. Will plan imaging after 6 cycles of chemo.   #Gait instability/visual disturbance-[Dr.Shah]-clinically- STABLE.   S/p EEG [5/13]- resulst pending.   # cardiomyopathy/ Sinus tachycardia- on metoprolol-/Tamabcor; [march 2d-echo- 40-45%]; STABLE.   # Elevated LFTs-likely secondary to progressive malignancy- STABLE>  # Peripheral neuropathy grade 1-2-  on Lyrica; STABLE.    # Right lower extremity DVT bilateral-November 28, 2019-STABLE;  on eliquis.   # DISPOSITION:  # gemcitabine/onprpo today;  # 2 weeks MD; labs- cbc/cmp;GEM- onpro-Dr.B     No orders of the defined types were placed in this encounter.  All questions were answered. The patient knows to call the clinic with any problems, questions or concerns.     Cammie Sickle, MD 02/12/2020 3:25 PM

## 2020-02-21 NOTE — Progress Notes (Signed)

## 2020-02-24 ENCOUNTER — Other Ambulatory Visit: Payer: Self-pay | Admitting: Cardiovascular Disease

## 2020-02-27 ENCOUNTER — Other Ambulatory Visit: Payer: Medicare HMO | Admitting: Nurse Practitioner

## 2020-02-27 ENCOUNTER — Encounter: Payer: Self-pay | Admitting: Nurse Practitioner

## 2020-02-27 ENCOUNTER — Other Ambulatory Visit: Payer: Self-pay

## 2020-02-27 DIAGNOSIS — C50919 Malignant neoplasm of unspecified site of unspecified female breast: Secondary | ICD-10-CM

## 2020-02-27 DIAGNOSIS — Z515 Encounter for palliative care: Secondary | ICD-10-CM

## 2020-02-27 NOTE — Progress Notes (Signed)
[place] Consult Note Telephone: [phone]  Fax: [phone]  PATIENT NAME: Sarah Horne DOB: 22-Feb-1956 MRN: [id number]  PRIMARY CARE PROVIDER:   Delsa Grana, PA-C  REFERRING PROVIDER:  Delsa Grana, PA-C [address] [place],  [place] [address]  RESPONSIBLE PARTY:  Self   Due to the COVID-19 crisis, this visit was done via telemedicine from my office and it was initiated and consent by this patient and or family.  RECOMMENDATIONS and PLAN: 1.ACP: DNR; MOST form completed in [facility]; DNR/Limited interventions, wishes for antibiotics, IVF, no feeding tube, continue with chemotherapy  2.Palliative care encounter; Palliative medicine team will continue to support patient, patient's family, and medical team. Visit consisted of counseling and education dealing with the complex and emotionally intense issues of symptom management and palliative care in the setting of serious and potentially life-threatening illness  I spent 50 minutes providing this consultation,  from 11:00am  to 11:50am. More than 50% of the time in this consultation was spent coordinating communication.   HISTORY OF PRESENT ILLNESS:  Sarah Horne is a 64 y.o. 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 Hospitalized 1/6 / 2021 to 1/8 / 2021 for soreness after sustaining of fall camo images negative for acute  fracture, received a dehydration. Hospitalized 2 / 3 / 2021 to 2 / 9 / 2021 for acute deep vein thrombosis of femoral vein of right lower extremity edema carcinoma of upper outer quadrant of left breast estrogen receptor-positive. She was treated with IV Heparin infusion, vascular consulted and thrombectomy considered however thrombectomy was aborted because MRI of brain shows metastasis. She was switched to Eliquis. Complained of dysphasia and barium swallow revealed cervical esophageal webs with GI consulted, EGD with esophageal dilatation and looks down 2 / 4 / 2021 with improvement of dysphasia. She was discharged to short-term rehab then home where she currently resides. 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 11/2019 partial response noted improve liver lesion. No new lesions. I called Sarah Horne for schedule telemedicine telephonic as video not available for initial palliative care visit. We talked about purpose of palliative care visit. Sarah Horne an agreement. We talked about how she was feeling today you. Sarah Horne endorses that she is doing okay. She was just getting her day started. We talked about symptoms of pain but she does not experience. We talked about shortness of breath. We talked about chronic disease and progression. We talked about Life review. We talked about family dynamics that she has two sons and five grandchildren. She is divorced. I called Sarah Horne for scheduled telemedicine telephonic is video not available follow-up palliative care visit. Sarah Horne and I talked about purpose of palliative care visit. Sarah Horne in agreement. We talked about how she was feeling today. Sarah Horne endorses that she's doing okay. Sarah. Horne has an upcoming appointment with Dr Starleen Arms on Thursday for another chemotherapy treatment. We talked about how her treatments have been for her.  Sarah Horne endorses her last chemo  therapy infusion went well. Sarah. Horne is not experiencing problems with nausea, vomiting, fatigue or weakness. Sarah Horne endorses she does have difficulty with plantar warts on the bottom of her feet in addition to neuropathy. Sarah Horne talked about having them removed though it was not recommended. We talked about shoe inserts. We talked about alternatives. We talked about symptoms of pain. Sarah Horne endorses that she does take Lyrica for pain but it is not helping as much as she would like for it to. We talked about other treatment options. Sarah Horne endorses that she would like to further discuss with Dr. Rogue Bussing will add her next appointment.We talked about quality of life. We talked about what is important to her as she is been independent and hopes to return where she can drive and function without assistance. We talked about chronic disease progression of neuropathy. Sarah Horne endorses she will be glad when she can drive again. We talked about the blood clot that she is currently being treated for. We talked about Sarah. Horne daily routine. We talked about prior to her when she was driving she would go to [place] several times a week. We talked about family support as she has two sons one Sarah Horne who lives local but has mental-health problems. Sarah Horne endorses that she is not discussed her cancer diagnosis with her son Sarah Horne as she feels like it would be more detrimental for him to know as of now since she feels like she is doing better. Her second son that lives in [place] as she does talk to about her cancer and updates him with all of her appointments and recommendations with plans. Sarah Horne brother does help her with groceries and will pick up whatever she needs. We talked about Sarah. Horne appetite which has been good. Weight has been stable. We talked about medical goals of care. Wishes are to continue with chemotherapy. We talked about role of palliative  care and plan of care. We talked about [place] palliative care visit in 6 weeks if needed or sooner if she declined. Sarah Imparato an agreement. Therapeutic listening and emotional support provided. Contact information. Questions answered to satisfaction.  Palliative Care was asked to help address goals of care.   CODE STATUS: DNR  PPS: 50% HOSPICE ELIGIBILITY/DIAGNOSIS: TBD  PAST MEDICAL HISTORY:  Past Medical History:  Diagnosis Date  . Chemotherapy-induced peripheral neuropathy ([place])   . Epistaxis    a. 11/2016 in setting of asa/plavix-->silver nitrate cauterization.  . GI bleed    a. 11/2016 Admission w/ GIB and hypovolemic shock req 3u PRBC's;  b. 11/2016 ECG: gastritis & nonbleeding peptic ulcer; c. 11/2016 Conlonoscopy: rectal and sigmoid colonic ulcers.  . Heart attack ([place])    a. 1998 Cath @ [facility]: reportedly no intervention required.  Marland Kitchen Herceptin-induced cardiomyopathy ([place])    a. In the setting of Herceptin Rx for breast cancer (initiated 12/2014); b. 03/2015 MUGA EF 64%; b. 08/2015 MUGA: EF 51%; c. 10/2015 MUGA: EF 44%; d. 11/2015 Echo: EF 45-50%; e. 01/2016 MUGA: EF 60%; f. 06/2016 MUGA EF 65%; g. 10/2016 MUGA: EF 61%;  h. 12/2016 Echo: EF 55-60%, gr1 DD.  Marland Kitchen Hyperlipidemia   . Hypertension   . Neuropathy   . Possible PAD (peripheral artery disease) ([place])    a. 11/2016 LE cyanosis and weak pulses-->CTA w/o significant Ao-BiFem dzs. ? distal dzs-->ASA/Plavix initiated by vascular surgery.  Marland Kitchen PSVT (paroxysmal supraventricular tachycardia) ([place])    a. Dx 11/2016.  . Pulmonary embolism (  [place])    a. 12/2016 CTA Chest: small nonocclusive PE in inferior segment of the Left lingula, somewhat eccentric filling defect suggesting chronic rather than acute embolic event; b. 11/6065 LE U/S:  No DVT; c. 12/2016 Echo: Nl RV fxn, nl PASP.  Marland Kitchen Recurrent Metastatic breast cancer ([place])    a. Dx 2016: Stage II, ER positive, PR positive, HER-2/neu overexpressing of the left breast-->chemo/radiation; b. 10/2016 CT  Abd/pelvis: diffuse liver mets, ill defined sclerotic bone lesions-T12;  c. 10/2016 MRI brain: metastatic lesion along L temporal lobe (19x50m) w/ extensive surrounding edema & 585mmidline shift to right.  . Sepsis ([place]  . Sinus tachycardia     SOCIAL HX:  Social History   Tobacco Use  . Smoking status: Light Tobacco Smoker    Packs/day: 0.25    Years: 18.00    Pack years: 4.50    Types: Cigarettes  . Smokeless tobacco: Never Used  Substance Use Topics  . Alcohol use: No    Alcohol/week: 0.0 standard drinks    ALLERGIES:  Allergies  Allergen Reactions  . No Known Allergies      PERTINENT MEDICATIONS:  Outpatient Encounter Medications as of 02/27/2020  Medication Sig  . acetaminophen (TYLENOL) 650 MG CR tablet Take 650 mg by mouth every 8 (eight) hours as needed for pain.  . Marland Kitchenpixaban (ELIQUIS) 5 MG TABS tablet Take 1 tablet (5 mg total) by mouth 2 (two) times daily.  . flecainide (TAMBOCOR) 50 MG tablet Take 1 tablet (50 mg total) by mouth 2 (two) times daily.  . Marland Kitchenpratropium (ATROVENT) 0.03 % nasal spray Place 2 sprays into both nostrils every 12 (twelve) hours.  . Marland Kitchenevocetirizine (XYZAL) 5 MG tablet TAKE 1 TABLET BY MOUTH EVERY DAY IN THE EVENING  . magnesium oxide (MAG-OX) 400 MG tablet TAKE 1 TABLET BY MOUTH TWICE A DAY  . meclizine (ANTIVERT) 25 MG tablet Take 1 tablet (25 mg total) by mouth 3 (three) times daily as needed for dizziness.  . metoprolol tartrate (LOPRESSOR) 25 MG tablet TAKE 1 TABLET BY MOUTH TWICE A DAY  . montelukast (SINGULAIR) 10 MG tablet Take 1 tablet (10 mg total) by mouth at bedtime.  . ondansetron (ZOFRAN) 8 MG tablet One pill every 8 hours as needed for nausea/vomitting.  . pregabalin (LYRICA) 100 MG capsule Take 1 capsule (100 mg total) by mouth 2 (two) times daily.  . prochlorperazine (COMPAZINE) 10 MG tablet Take 1 tablet (10 mg total) by mouth every 6 (six) hours as needed for nausea or vomiting.  . Vitamin D, Ergocalciferol, (DRISDOL) 1.25 MG  (50000 UNIT) CAPS capsule TAKE 1 CAPSULE (50,000 UNITS TOTAL) ONCE A WEEK BY MOUTH.   Facility-Administered Encounter Medications as of 02/27/2020  Medication  . 0.9 %  sodium chloride infusion  . 0.9 %  sodium chloride infusion  . 0.9 %  sodium chloride infusion  . heparin lock flush 100 unit/mL  . sodium chloride 0.9 % injection 10 mL  . sodium chloride flush (NS) 0.9 % injection 10 mL    PHYSICAL EXAM:   Deferred  Christian Borgerding Z Ifrah Vest, NP

## 2020-02-28 ENCOUNTER — Inpatient Hospital Stay: Payer: Medicare HMO | Attending: Internal Medicine

## 2020-02-28 ENCOUNTER — Other Ambulatory Visit: Payer: Self-pay

## 2020-02-28 ENCOUNTER — Inpatient Hospital Stay (HOSPITAL_BASED_OUTPATIENT_CLINIC_OR_DEPARTMENT_OTHER): Payer: Medicare HMO | Admitting: Internal Medicine

## 2020-02-28 ENCOUNTER — Inpatient Hospital Stay: Payer: Medicare HMO

## 2020-02-28 DIAGNOSIS — Z170 Estrogen receptor positive status [ER+]: Secondary | ICD-10-CM | POA: Diagnosis not present

## 2020-02-28 DIAGNOSIS — R7989 Other specified abnormal findings of blood chemistry: Secondary | ICD-10-CM | POA: Diagnosis not present

## 2020-02-28 DIAGNOSIS — Z79899 Other long term (current) drug therapy: Secondary | ICD-10-CM | POA: Diagnosis not present

## 2020-02-28 DIAGNOSIS — Z86711 Personal history of pulmonary embolism: Secondary | ICD-10-CM | POA: Diagnosis not present

## 2020-02-28 DIAGNOSIS — C787 Secondary malignant neoplasm of liver and intrahepatic bile duct: Secondary | ICD-10-CM | POA: Diagnosis not present

## 2020-02-28 DIAGNOSIS — Z7189 Other specified counseling: Secondary | ICD-10-CM

## 2020-02-28 DIAGNOSIS — Z833 Family history of diabetes mellitus: Secondary | ICD-10-CM | POA: Insufficient documentation

## 2020-02-28 DIAGNOSIS — Z86718 Personal history of other venous thrombosis and embolism: Secondary | ICD-10-CM | POA: Insufficient documentation

## 2020-02-28 DIAGNOSIS — C50412 Malignant neoplasm of upper-outer quadrant of left female breast: Secondary | ICD-10-CM

## 2020-02-28 DIAGNOSIS — C7931 Secondary malignant neoplasm of brain: Secondary | ICD-10-CM | POA: Diagnosis not present

## 2020-02-28 DIAGNOSIS — Z803 Family history of malignant neoplasm of breast: Secondary | ICD-10-CM | POA: Insufficient documentation

## 2020-02-28 DIAGNOSIS — R2689 Other abnormalities of gait and mobility: Secondary | ICD-10-CM | POA: Diagnosis not present

## 2020-02-28 DIAGNOSIS — Z8249 Family history of ischemic heart disease and other diseases of the circulatory system: Secondary | ICD-10-CM | POA: Diagnosis not present

## 2020-02-28 DIAGNOSIS — G629 Polyneuropathy, unspecified: Secondary | ICD-10-CM | POA: Insufficient documentation

## 2020-02-28 DIAGNOSIS — E785 Hyperlipidemia, unspecified: Secondary | ICD-10-CM | POA: Diagnosis not present

## 2020-02-28 DIAGNOSIS — H6123 Impacted cerumen, bilateral: Secondary | ICD-10-CM | POA: Insufficient documentation

## 2020-02-28 DIAGNOSIS — Z7901 Long term (current) use of anticoagulants: Secondary | ICD-10-CM | POA: Diagnosis not present

## 2020-02-28 DIAGNOSIS — H539 Unspecified visual disturbance: Secondary | ICD-10-CM | POA: Diagnosis not present

## 2020-02-28 DIAGNOSIS — Z5111 Encounter for antineoplastic chemotherapy: Secondary | ICD-10-CM | POA: Insufficient documentation

## 2020-02-28 DIAGNOSIS — I252 Old myocardial infarction: Secondary | ICD-10-CM | POA: Insufficient documentation

## 2020-02-28 DIAGNOSIS — Z5189 Encounter for other specified aftercare: Secondary | ICD-10-CM | POA: Insufficient documentation

## 2020-02-28 DIAGNOSIS — C7800 Secondary malignant neoplasm of unspecified lung: Secondary | ICD-10-CM | POA: Diagnosis not present

## 2020-02-28 DIAGNOSIS — Z95828 Presence of other vascular implants and grafts: Secondary | ICD-10-CM

## 2020-02-28 DIAGNOSIS — I10 Essential (primary) hypertension: Secondary | ICD-10-CM | POA: Insufficient documentation

## 2020-02-28 DIAGNOSIS — F17210 Nicotine dependence, cigarettes, uncomplicated: Secondary | ICD-10-CM | POA: Insufficient documentation

## 2020-02-28 LAB — COMPREHENSIVE METABOLIC PANEL
ALT: 25 U/L (ref 0–44)
AST: 46 U/L — ABNORMAL HIGH (ref 15–41)
Albumin: 3.4 g/dL — ABNORMAL LOW (ref 3.5–5.0)
Alkaline Phosphatase: 257 U/L — ABNORMAL HIGH (ref 38–126)
Anion gap: 10 (ref 5–15)
BUN: 10 mg/dL (ref 8–23)
CO2: 28 mmol/L (ref 22–32)
Calcium: 8.8 mg/dL — ABNORMAL LOW (ref 8.9–10.3)
Chloride: 103 mmol/L (ref 98–111)
Creatinine, Ser: 0.65 mg/dL (ref 0.44–1.00)
GFR calc Af Amer: >60 mL/min (ref >60)
GFR calc non Af Amer: >60 mL/min (ref >60)
Glucose, Bld: 90 mg/dL (ref 70–99)
Potassium: 3.4 mmol/L — ABNORMAL LOW (ref 3.5–5.1)
Sodium: 141 mmol/L (ref 135–145)
Total Bilirubin: 1 mg/dL (ref 0.3–1.2)
Total Protein: 6.8 g/dL (ref 6.5–8.1)

## 2020-02-28 LAB — CBC WITH DIFFERENTIAL/PLATELET
Abs Immature Granulocytes: 0.02 10*3/uL (ref 0.00–0.07)
Basophils Absolute: 0 10*3/uL (ref 0.0–0.1)
Basophils Relative: 0 %
Eosinophils Absolute: 0.1 10*3/uL (ref 0.0–0.5)
Eosinophils Relative: 1 %
HCT: 38.8 % (ref 36.0–46.0)
Hemoglobin: 12.9 g/dL (ref 12.0–15.0)
Immature Granulocytes: 0 %
Lymphocytes Relative: 11 %
Lymphs Abs: 0.6 10*3/uL — ABNORMAL LOW (ref 0.7–4.0)
MCH: 31.9 pg (ref 26.0–34.0)
MCHC: 33.2 g/dL (ref 30.0–36.0)
MCV: 96 fL (ref 80.0–100.0)
Monocytes Absolute: 0.5 10*3/uL (ref 0.1–1.0)
Monocytes Relative: 10 %
Neutro Abs: 3.7 10*3/uL (ref 1.7–7.7)
Neutrophils Relative %: 78 %
Platelets: 200 10*3/uL (ref 150–400)
RBC: 4.04 MIL/uL (ref 3.87–5.11)
RDW: 19.2 % — ABNORMAL HIGH (ref 11.5–15.5)
WBC: 4.9 10*3/uL (ref 4.0–10.5)
nRBC: 0 % (ref 0.0–0.2)

## 2020-02-28 MED ORDER — HEPARIN SOD (PORK) LOCK FLUSH 100 UNIT/ML IV SOLN
500.0000 [IU] | Freq: Once | INTRAVENOUS | Status: AC | PRN
  Administered 2020-02-28: 500 [IU]
  Filled 2020-02-28: qty 5

## 2020-02-28 MED ORDER — SODIUM CHLORIDE 0.9% FLUSH
10.0000 mL | INTRAVENOUS | Status: DC | PRN
  Administered 2020-02-28: 10 mL via INTRAVENOUS
  Filled 2020-02-28: qty 10

## 2020-02-28 MED ORDER — HEPARIN SOD (PORK) LOCK FLUSH 100 UNIT/ML IV SOLN
INTRAVENOUS | Status: AC
  Filled 2020-02-28: qty 5

## 2020-02-28 MED ORDER — SODIUM CHLORIDE 0.9 % IV SOLN
Freq: Once | INTRAVENOUS | Status: AC
  Filled 2020-02-28: qty 250

## 2020-02-28 MED ORDER — PROCHLORPERAZINE MALEATE 10 MG PO TABS
10.0000 mg | ORAL_TABLET | Freq: Once | ORAL | Status: AC
  Administered 2020-02-28: 10 mg via ORAL
  Filled 2020-02-28: qty 1

## 2020-02-28 MED ORDER — SODIUM CHLORIDE 0.9 % IV SOLN
1800.0000 mg | Freq: Once | INTRAVENOUS | Status: AC
  Administered 2020-02-28: 1800 mg via INTRAVENOUS
  Filled 2020-02-28: qty 26.3

## 2020-02-28 MED ORDER — PEGFILGRASTIM 6 MG/0.6ML ~~LOC~~ PSKT
6.0000 mg | PREFILLED_SYRINGE | Freq: Once | SUBCUTANEOUS | Status: AC
  Administered 2020-02-28: 6 mg via SUBCUTANEOUS
  Filled 2020-02-28: qty 0.6

## 2020-02-28 NOTE — Assessment & Plan Note (Addendum)
#  Recurrent metastatic breast cancer-lung/liver metastases ER PR positive HER-2 negative; CT scan March second 2021-partial response noted-improved liver lesions; lung lesion.  No new lesions.  Currently gemcitabine single agent.  # proceed with gemcitabine #5 every 2 weeks[ sec to neutropenia/on pro].  Labs today reviewed;  acceptable for treatment today. Will plan imaging after 6 cycles of chemo; will order scans at next visit.  #Gait instability/visual disturbance-[Dr.Shah]-clinically-  Stable.   S/p EEG [5/13]- negative for seizures.  # cardiomyopathy/ Sinus tachycardia- on metoprolol-/Tamabcor; [march 2d-echo- 40-45%]; stable. .   # Elevated LFTs-likely secondary to progressive malignancy- stable.   # Peripheral neuropathy grade 1-2-  on Lyrica; stable.     # weight loss/ poor appetite; sec to malignancy/ chemo- recommend eval to [facility]; discussed dietary interventions.   # Right lower extremity DVT bilateral-November 28, 2019- stable.  On eliquis.   # DISPOSITION:  # gemcitabine/onprpo today;  # 2 weeks MD; labs- cbc/cmp;GEM- onpro-Dr.B

## 2020-02-28 NOTE — Progress Notes (Signed)
[facility] OFFICE PROGRESS NOTE  Patient Care Team: Delsa Grana, PA-C as PCP - General (Family Medicine) Wellington Hampshire, MD as PCP - Cardiology (Cardiology) Bary Castilla Forest Gleason, MD (General Surgery) Vladimir Crofts, MD as Consulting Physician (Neurology)  Cancer Staging No matching staging information was found for the patient.   Oncology History Overview Note  # LEFT BREAST IDC; STAGE II [cT2N1] ER >90%; PR- 50-90%; her 2 NEU- POS; s/p Neoadj chemo; AUG 2016- TCH+P s/p Lumpec & partial ALND- path CR; s/p RT [finished Nov 2016];  adj Herceptin; HELD for Jan 19th 2017 [in DEC 2016-EF dropped from 63 to 51%; FEB 27th EF-42.9%]; JAN 2016 START Arimidex; May 2017- EF- 60%; May 24th 2017-Re-start Herceptin q 3W; July 28th STOP herceptin [finished 31m/m2; sec to Low EF; however 2017 Oct EF= 67%; improved]  # DEC 4th 2017- Start Neratinib 4 pills; DEC 11th 3 pills; STOPPED.  # FEB 2018- RECURRENCE ER/PR positive; Her 2 NEGATIVE [liver Bx]  # MARCH 1st 2018- Tax-Cytoxan [poor tol]  # FEB 2018- Brain mets [SBRT; Dr.Crystal; finished Feb 28th 2018]  # March 2018- Eribulin- poor tolerance  # June 8th 2018- Started Faslodex + Abema [abema-multiple interruptions sec to neutropenia]  # MRI Brain- March 2019- Leptomeningeal mets [WBRT; No LP s/p WBRT- 01/09/2018]; STOP Abema [551mday-sec to severe cytopenia]+faslodex  # MAY 20tth 019- Start Xeloda; STOPPED in Nov 22nd 2019- sec to Progressive liver lesions on CT scan  # NOV 26th 2019- Faslodex + Piqaray 25065m; Jan 2020- Piqray 300 mg+ faslodex; FEB 17th CT scan- mixed response/ Overall progression; STOP Piqray + faslodex.   # FEB 25th 2020- ERIBULIN; Poor tolerance sec to severe neutropenia.  Stopped May 2020.  CT scan May 2020-new/progressive 2 cm liver lesion;   #July nd 2020-Ixempra x5 cycles-; NOV 2020-MRI liver- worsening liver lesions [rising AST/ALT].   # NOV [id number] weekly [consent-]; December  31-2020-progression/rising LFTs.  Stop Taxol  #January 15th, 2021-Adriamycin weekly [C]; MARCH 2021- Partial response. STOP ADRIAMYCIN sec to EF drop 40-45%.   # April 2nd-2021-gemcitabine; 01/15/2020-GEM [800 mg/m2; #3 cycle 1000m51m] q 2 W; with onpro.   #May 06, 2019-MRI brain 5 new lesions [3 mm to 10 mm]-asymptomatic; [previous whole brain radiation]- OCT 2020- Re-Irradiation.   # PN- G-2; may 2017- Cymbalta 60mg67mARCH 2018-  acute vascular insuff of BIL LE [? Taxotere vasoconstriction on asprin]; # hemorragic shock LGIB [s/p colo; Dr.Wohl]  #  SVT- on flecainide.   # April 2016- Liver Bx- NEG;   # Drop in EF from Herceptin [recovered OCt 27th- EF 65%]; March 2020 ejection fraction 40 to 45%.  Stop Adriamycin.  # BMD- jan 2017- osteopenia  # 2020- SEP-s/p Pallaitive care evaluation  -----------------------------------------------------------     [place]th [place]- PDL-1 0%/NEG for BRCA; MSI-Stable; Positive for PI3K; ccnd-1; FGF** -----------------------------------------------------------  Dx: Metastatic Breast cancer [ER/PR-Pos; her -2 NEG] Stage IV; goals: palliative Current treatment-gemcitabine chemotherapy [C]   Carcinoma of upper-outer quadrant of left breast in female, estrogen receptor positive (HCC) [place]25/2020 - 01/17/2019 Chemotherapy   The patient had eriBULin mesylate (HALAVEN) 2.1 mg in sodium chloride 0.9 % 100 mL chemo infusion, 1.2 mg/m2 = 2.1 mg (100 % of original dose 1.2 mg/m2), Intravenous,  Once, 3 of 4 cycles Dose modification: 1.2 mg/m2 (original dose 1.2 mg/m2, Cycle 1, Reason: Provider Judgment), 0.8 mg/m2 (original dose 1.2 mg/m2, Cycle 3, Reason: Provider Judgment) Administration: 2.1 mg (11/22/2018), 2.1 mg (11/29/2018), 2.1 mg (12/20/2018),  1.4 mg (01/17/2019)  for chemotherapy treatment.    03/30/2019 - 07/12/2019 Chemotherapy   The patient had pegfilgrastim (NEULASTA ONPRO KIT) injection 6 mg, 6 mg, Subcutaneous,  Once, 5 of 8 cycles Administration: 6 mg (03/30/2019), 6 mg (04/20/2019), 6 mg (05/11/2019), 6 mg (06/22/2019), 6 mg (06/01/2019) ixabepilone (IXEMPRA) 60 mg in lactated ringers 250 mL chemo infusion, 33.5 mg/m2 = 57 mg (100 % of original dose 32 mg/m2), Intravenous,  Once, 5 of 8 cycles Dose modification: 32 mg/m2 (original dose 32 mg/m2, Cycle 1, Reason: Provider Judgment) Administration: 72 mg (04/20/2019), 72 mg (05/11/2019), 72 mg (06/22/2019), 72 mg (06/01/2019)  for chemotherapy treatment.    08/18/2019 - 09/28/2019 Chemotherapy   The patient had PACLitaxel (TAXOL) 150 mg in sodium chloride 0.9 % 250 mL chemo infusion (</= '80mg'$ /m2), 80 mg/m2 = 150 mg, Intravenous,  Once, 1 of 4 cycles Administration: 150 mg (08/18/2019), 150 mg (09/08/2019), 150 mg (09/15/2019)  for chemotherapy treatment.    10/13/2019 - 11/30/2019 Chemotherapy   The patient had DOXOrubicin (ADRIAMYCIN) chemo injection 38 mg, 20 mg/m2 = 38 mg, Intravenous,  Once, 6 of 9 cycles Administration: 38 mg (10/13/2019), 38 mg (10/20/2019), 38 mg (10/27/2019), 38 mg (11/17/2019), 38 mg (11/24/2019), 38 mg (11/10/2019) palonosetron (ALOXI) injection 0.25 mg, 0.25 mg, Intravenous,  Once, 6 of 9 cycles Administration: 0.25 mg (10/13/2019), 0.25 mg (10/20/2019), 0.25 mg (10/27/2019), 0.25 mg (11/17/2019), 0.25 mg (11/24/2019), 0.25 mg (11/10/2019)  for chemotherapy treatment.    12/29/2019 -  Chemotherapy   The patient had pegfilgrastim (NEULASTA ONPRO KIT) injection 6 mg, 6 mg, Subcutaneous, Once, 4 of 7 cycles Administration: 6 mg (01/15/2020), 6 mg (01/29/2020), 6 mg (02/12/2020) gemcitabine (GEMZAR) 1,444 mg in sodium chloride 0.9 % 250 mL chemo infusion, 800 mg/m2 = 1,444 mg, Intravenous,  Once, 5 of 8 cycles Dose modification: 1,000 mg/m2 (original dose 800 mg/m2, Cycle 3, Reason: Provider Judgment) Administration: 1,444 mg (12/29/2019), 1,444 mg (01/15/2020), 1,800 mg (01/29/2020), 1,800 mg (02/12/2020)  for chemotherapy treatment.      INTERVAL  HISTORY:  Sarah Horne 64 y.o.  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  Patient admits to mild swelling of the right foot; this is not any worse.  She continues to be on anticoagulation.  No falls.  Appetite is poor.  Lost weight.  No nausea no vomiting no fevers.  Review of Systems  Constitutional: Positive for malaise/fatigue. Negative for chills, diaphoresis, fever and weight loss.  HENT: Negative for nosebleeds and sore throat.   Eyes: Negative for double vision.  Respiratory: Negative for cough, hemoptysis, sputum production, shortness of breath and wheezing.   Cardiovascular: Negative for chest pain, palpitations, orthopnea and leg swelling.  Gastrointestinal: Positive for constipation. Negative for abdominal pain, blood in stool, heartburn, melena, nausea and vomiting.  Genitourinary: Negative for dysuria, frequency and urgency.  Musculoskeletal: Positive for joint pain. Negative for back pain.  Skin: Negative for itching.  Neurological: Positive for dizziness and tingling. Negative for focal weakness, weakness and headaches.  Endo/Heme/Allergies: Does not bruise/bleed easily.  Psychiatric/Behavioral: Negative for depression. The patient is not nervous/anxious and does not have insomnia.     PAST MEDICAL HISTORY :  Past Medical History:  Diagnosis Date  . Chemotherapy-induced peripheral neuropathy ([place])   . Epistaxis    a. 11/2016 in setting of asa/plavix-->silver nitrate cauterization.  . GI bleed    a. 11/2016 Admission w/ GIB  and hypovolemic shock req 3u PRBC's;  b. 11/2016 ECG: gastritis & nonbleeding peptic ulcer; c. 11/2016 Conlonoscopy: rectal and sigmoid colonic ulcers.  . Heart attack ([place])    a. 1998 Cath @ [facility]: reportedly no intervention required.  Marland Kitchen Herceptin-induced cardiomyopathy ([place])    a. In the setting of Herceptin Rx for  breast cancer (initiated 12/2014); b. 03/2015 MUGA EF 64%; b. 08/2015 MUGA: EF 51%; c. 10/2015 MUGA: EF 44%; d. 11/2015 Echo: EF 45-50%; e. 01/2016 MUGA: EF 60%; f. 06/2016 MUGA EF 65%; g. 10/2016 MUGA: EF 61%;  h. 12/2016 Echo: EF 55-60%, gr1 DD.  Marland Kitchen Hyperlipidemia   . Hypertension   . Neuropathy   . Possible PAD (peripheral artery disease) ([place])    a. 11/2016 LE cyanosis and weak pulses-->CTA w/o significant Ao-BiFem dzs. ? distal dzs-->ASA/Plavix initiated by vascular surgery.  Marland Kitchen PSVT (paroxysmal supraventricular tachycardia) ([place])    a. Dx 11/2016.  . Pulmonary embolism ([place])    a. 12/2016 CTA Chest: small nonocclusive PE in inferior segment of the Left lingula, somewhat eccentric filling defect suggesting chronic rather than acute embolic event; b. 01/8526 LE U/S:  No DVT; c. 12/2016 Echo: Nl RV fxn, nl PASP.  Marland Kitchen Recurrent Metastatic breast cancer ([place])    a. Dx 2016: Stage II, ER positive, PR positive, HER-2/neu overexpressing of the left breast-->chemo/radiation; b. 10/2016 CT Abd/pelvis: diffuse liver mets, ill defined sclerotic bone lesions-T12;  c. 10/2016 MRI brain: metastatic lesion along L temporal lobe (19x88m) w/ extensive surrounding edema & 562mmidline shift to right.  . Sepsis ([place]  . Sinus tachycardia     PAST SURGICAL HISTORY :   Past Surgical History:  Procedure Laterality Date  . BREAST BIOPSY Left 2016   Positive  . BREAST LUMPECTOMY WITH SENTINEL LYMPH NODE BIOPSY Left 05/23/2015   Procedure: LEFT BREAST WIDE EXCISION WITH AXILLARY DISSECTION, MASTOPLASTY ;  Surgeon: JeRobert BellowMD;  Location: [facility] ORS;  Service: General;  Laterality: Left;  . BREAST SURGERY Left 12/18/14   breast biopsy/INVASIVE DUCTAL CARCINOMA OF BREAST, NOTTINGHAM GRADE 2.  . Marland KitchenREAST SURGERY  05/23/2015.   Wide excision/mastoplasty, axillary dissection. No residual invasive cancer, positive for residual DCIS. 0/2 nodes identified on axillary dissection. (no SLN by technetium or methylene blue)  . CARDIAC  CATHETERIZATION    . COLONOSCOPY WITH PROPOFOL N/A 12/10/2016   Procedure: COLONOSCOPY WITH PROPOFOL;  Surgeon: DaLucilla LameMD;  Location: [facility] ENDOSCOPY;  Service: Endoscopy;  Laterality: N/A;  . COLONOSCOPY WITH PROPOFOL N/A 02/13/2017   Procedure: COLONOSCOPY WITH PROPOFOL;  Surgeon: WoLucilla LameMD;  [facility];  Service: Endoscopy;  Laterality: N/A;  . ESOPHAGOGASTRODUODENOSCOPY N/A 11/03/2019   Procedure: ESOPHAGOGASTRODUODENOSCOPY (EGD);  Surgeon: VaLin LandsmanMD;  [facility];  Service: Gastroenterology;  Laterality: N/A;  . ESOPHAGOGASTRODUODENOSCOPY (EGD) WITH PROPOFOL N/A 12/08/2016   Procedure: ESOPHAGOGASTRODUODENOSCOPY (EGD) WITH PROPOFOL;  Surgeon: DaLucilla LameMD;  Location: [facility];  Service: Endoscopy;  Laterality: N/A;  . IVC FILTER INSERTION N/A 02/15/2017   Procedure: IVC Filter Insertion;  Surgeon: DeAlgernon HuxleyMD;  [facility];  Service: Cardiovascular;  Laterality: N/A;  . PORTACATH PLACEMENT Right 12-31-14   Dr ByBary Castilla  FAMILY HISTORY :   Family History  Problem Relation Age of Onset  . Breast cancer Maternal Aunt   . Breast cancer Cousin   . Brain cancer Maternal Uncle   . Diabetes Mother   . Hypertension Mother   . Stroke Mother  SOCIAL HISTORY:   Social History   Tobacco Use  . Smoking status: Light Tobacco Smoker    Packs/day: 0.25    Years: 18.00    Pack years: 4.50    Types: Cigarettes  . Smokeless tobacco: Never Used  Substance Use Topics  . Alcohol use: No    Alcohol/week: 0.0 standard drinks  . Drug use: No    ALLERGIES:  is allergic to no known allergies.  MEDICATIONS:  Current Outpatient Medications  Medication Sig Dispense Refill  . acetaminophen (TYLENOL) 650 MG CR tablet Take 650 mg by mouth every 8 (eight) hours as needed for pain.    Marland Kitchen apixaban (ELIQUIS) 5 MG TABS tablet Take 1 tablet (5 mg total) by mouth 2 (two) times daily. 60 tablet 4  . flecainide (TAMBOCOR) 50  MG tablet Take 1 tablet (50 mg total) by mouth 2 (two) times daily. 60 tablet 4  . ipratropium (ATROVENT) 0.03 % nasal spray Place 2 sprays into both nostrils every 12 (twelve) hours. 30 mL 12  . levocetirizine (XYZAL) 5 MG tablet TAKE 1 TABLET BY MOUTH EVERY DAY IN THE EVENING 90 tablet 1  . magnesium oxide (MAG-OX) 400 MG tablet TAKE 1 TABLET BY MOUTH TWICE A DAY 60 tablet 0  . meclizine (ANTIVERT) 25 MG tablet Take 1 tablet (25 mg total) by mouth 3 (three) times daily as needed for dizziness. 30 tablet 0  . metoprolol tartrate (LOPRESSOR) 25 MG tablet TAKE 1 TABLET BY MOUTH TWICE A DAY 60 tablet 1  . montelukast (SINGULAIR) 10 MG tablet Take 1 tablet (10 mg total) by mouth at bedtime. 60 tablet 3  . ondansetron (ZOFRAN) 8 MG tablet One pill every 8 hours as needed for nausea/vomitting. 40 tablet 1  . pregabalin (LYRICA) 100 MG capsule Take 1 capsule (100 mg total) by mouth 2 (two) times daily. 60 capsule 2  . prochlorperazine (COMPAZINE) 10 MG tablet Take 1 tablet (10 mg total) by mouth every 6 (six) hours as needed for nausea or vomiting. 40 tablet 1  . Vitamin D, Ergocalciferol, (DRISDOL) 1.25 MG (50000 UNIT) CAPS capsule TAKE 1 CAPSULE (50,000 UNITS TOTAL) ONCE A WEEK BY MOUTH. 12 capsule 1   No current facility-administered medications for this visit.   Facility-Administered Medications Ordered in Other Visits  Medication Dose Route Frequency Provider Last Rate Last Admin  . 0.9 %  sodium chloride infusion   Intravenous Continuous [name], MD   New Bag at 12/10/16 1140  . 0.9 %  sodium chloride infusion   Intravenous Continuous Lloyd Huger, MD 999 mL/hr at 04/24/15 1520 New Bag at 05/23/15 0951  . 0.9 %  sodium chloride infusion   Intravenous Continuous Verlon Au, NP 999 mL/hr at 10/31/19 1522 New Bag at 10/31/19 1522  . 0.9 %  sodium chloride infusion   Intravenous Once Charlaine Dalton R, MD      . gemcitabine (GEMZAR) 1,800 mg in sodium chloride 0.9 % 250 mL  chemo infusion  1,800 mg Intravenous Once Charlaine Dalton R, MD      . heparin lock flush 100 unit/mL  500 Units Intravenous Once Berenzon, Dmitriy, MD      . heparin lock flush 100 unit/mL  500 Units Intracatheter Once PRN Charlaine Dalton R, MD      . pegfilgrastim (NEULASTA ONPRO KIT) injection 6 mg  6 mg Subcutaneous Once Charlaine Dalton R, MD      . prochlorperazine (COMPAZINE) tablet 10 mg  10 mg Oral Once  Charlaine Dalton R, MD      . sodium chloride 0.9 % injection 10 mL  10 mL Intravenous PRN [name], MD   10 mL at 04/04/15 1440  . sodium chloride flush (NS) 0.9 % injection 10 mL  10 mL Intravenous PRN Berenzon, Dmitriy, MD      . sodium chloride flush (NS) 0.9 % injection 10 mL  10 mL Intravenous PRN Cammie Sickle, MD   10 mL at 02/28/20 0845    PHYSICAL EXAMINATION: ECOG PERFORMANCE STATUS: 1 - Symptomatic but completely ambulatory  BP 137/87 (BP Location: Left Arm, Patient Position: Sitting, Cuff Size: Normal)   Pulse 77   Temp 98.5 F (36.9 C) (Tympanic)   Resp 20   Wt 148 lb (67.1 kg)   BMI 23.89 kg/m   Filed Weights   02/28/20 0901  Weight: 148 lb (67.1 kg)    Physical Exam  Constitutional: She is oriented to person, place, and time and well-developed, well-nourished, and in no distress.  Patient is alone.  In wheel chair.   HENT:  Head: Normocephalic and atraumatic.  Mouth/Throat: Oropharynx is clear and moist. No oropharyngeal exudate.  Eyes: Pupils are equal, round, and reactive to light.  Cardiovascular: Normal rate and regular rhythm.  Pulmonary/Chest: Effort normal and breath sounds normal. No respiratory distress. She has no wheezes.  Abdominal: Soft. Bowel sounds are normal. She exhibits no distension and no mass. There is no abdominal tenderness. There is no rebound and no guarding.  Musculoskeletal:        General: No tenderness. Normal range of motion.     Cervical back: Normal range of motion and neck supple.   Neurological: She is alert and oriented to person, place, and time.  Broad based gait.  Unsteady.  Skin: Skin is warm.  Psychiatric: Affect normal.    LABORATORY DATA:  I have reviewed the data as listed    Component Value Date/Time   NA 141 02/28/2020 0827   NA 135 03/04/2017 0918   NA 135 01/08/2015 0850   K 3.4 (L) 02/28/2020 0827   K 3.7 01/08/2015 0850   CL 103 02/28/2020 0827   CL 101 01/08/2015 0850   CO2 28 02/28/2020 0827   CO2 26 01/08/2015 0850   GLUCOSE 90 02/28/2020 0827   GLUCOSE 161 (H) 01/08/2015 0850   BUN 10 02/28/2020 0827   BUN 8 03/04/2017 0918   BUN 17 01/08/2015 0850   CREATININE 0.65 02/28/2020 0827   CREATININE 0.82 01/22/2015 1559   CALCIUM 8.8 (L) 02/28/2020 0827   CALCIUM 9.3 01/08/2015 0850   PROT 6.8 02/28/2020 0827   PROT 6.4 03/04/2017 0918   PROT 6.8 01/22/2015 1559   ALBUMIN 3.4 (L) 02/28/2020 0827   ALBUMIN 2.5 (L) 03/04/2017 0918   ALBUMIN 3.9 01/22/2015 1559   AST 46 (H) 02/28/2020 0827   AST 34 01/22/2015 1559   ALT 25 02/28/2020 0827   ALT 42 01/22/2015 1559   ALKPHOS 257 (H) 02/28/2020 0827   ALKPHOS 157 (H) 01/22/2015 1559   BILITOT 1.0 02/28/2020 0827   BILITOT 6.2 (H) 03/04/2017 0918   BILITOT 0.2 (L) 01/22/2015 1559   GFRNONAA >60 02/28/2020 0827   GFRNONAA >60 01/22/2015 1559   GFRAA >60 02/28/2020 0827   GFRAA >60 01/22/2015 1559    No results found for: SPEP, UPEP  Lab Results  Component Value Date   WBC 4.9 02/28/2020   NEUTROABS 3.7 02/28/2020   HGB 12.9 02/28/2020   HCT 38.8  02/28/2020   MCV 96.0 02/28/2020   PLT 200 02/28/2020      Chemistry      Component Value Date/Time   NA 141 02/28/2020 0827   NA 135 03/04/2017 0918   NA 135 01/08/2015 0850   K 3.4 (L) 02/28/2020 0827   K 3.7 01/08/2015 0850   CL 103 02/28/2020 0827   CL 101 01/08/2015 0850   CO2 28 02/28/2020 0827   CO2 26 01/08/2015 0850   BUN 10 02/28/2020 0827   BUN 8 03/04/2017 0918   BUN 17 01/08/2015 0850   CREATININE 0.65  02/28/2020 0827   CREATININE 0.82 01/22/2015 1559      Component Value Date/Time   CALCIUM 8.8 (L) 02/28/2020 0827   CALCIUM 9.3 01/08/2015 0850   ALKPHOS 257 (H) 02/28/2020 0827   ALKPHOS 157 (H) 01/22/2015 1559   AST 46 (H) 02/28/2020 0827   AST 34 01/22/2015 1559   ALT 25 02/28/2020 0827   ALT 42 01/22/2015 1559   BILITOT 1.0 02/28/2020 0827   BILITOT 6.2 (H) 03/04/2017 0918   BILITOT 0.2 (L) 01/22/2015 1559       RADIOGRAPHIC STUDIES: I have personally reviewed the radiological images as listed and agreed with the findings in the report. No results found.   ASSESSMENT & PLAN:  Carcinoma of upper-outer quadrant of left breast in female, estrogen receptor positive ([place]) # Recurrent metastatic breast cancer-lung/liver metastases ER PR positive HER-2 negative; CT scan March second 2021-partial response noted-improved liver lesions; lung lesion.  No new lesions.  Currently gemcitabine single agent.  # proceed with gemcitabine #5 every 2 weeks[ sec to neutropenia/on pro].  Labs today reviewed;  acceptable for treatment today. Will plan imaging after 6 cycles of chemo; will order scans at next visit.  #Gait instability/visual disturbance-[Dr.Shah]-clinically-  Stable.   S/p EEG [5/13]- negative for seizures.  # cardiomyopathy/ Sinus tachycardia- on metoprolol-/Tamabcor; [march 2d-echo- 40-45%]; stable. .   # Elevated LFTs-likely secondary to progressive malignancy- stable.   # Peripheral neuropathy grade 1-2-  on Lyrica; stable.     # weight loss/ poor appetite; sec to malignancy/ chemo- recommend eval to [place]; discussed dietary interventions.   # Right lower extremity DVT bilateral-November 28, 2019- stable.  On eliquis.   # DISPOSITION:  # gemcitabine/onprpo today;  # 2 weeks MD; labs- cbc/cmp;GEM- onpro-Dr.B     No orders of the defined types were placed in this encounter.  All questions were answered. The patient knows to call the clinic with any problems, questions or  concerns.     Cammie Sickle, MD 02/28/2020 9:46 AM

## 2020-03-01 ENCOUNTER — Inpatient Hospital Stay (HOSPITAL_BASED_OUTPATIENT_CLINIC_OR_DEPARTMENT_OTHER): Payer: Medicare HMO | Admitting: Hospice and Palliative Medicine

## 2020-03-01 DIAGNOSIS — Z515 Encounter for palliative care: Secondary | ICD-10-CM

## 2020-03-01 NOTE — Progress Notes (Signed)
I did not reach patient by phone for scheduled telephone visit.  Voicemail left.  Will reschedule.

## 2020-03-07 ENCOUNTER — Telehealth (INDEPENDENT_AMBULATORY_CARE_PROVIDER_SITE_OTHER): Payer: Medicare HMO | Admitting: Cardiovascular Disease

## 2020-03-07 ENCOUNTER — Encounter: Payer: Self-pay | Admitting: Cardiovascular Disease

## 2020-03-07 ENCOUNTER — Other Ambulatory Visit: Payer: Self-pay

## 2020-03-07 VITALS — Ht 66.0 in | Wt 154.0 lb

## 2020-03-07 DIAGNOSIS — T451X5A Adverse effect of antineoplastic and immunosuppressive drugs, initial encounter: Secondary | ICD-10-CM | POA: Diagnosis not present

## 2020-03-07 DIAGNOSIS — I427 Cardiomyopathy due to drug and external agent: Secondary | ICD-10-CM | POA: Diagnosis not present

## 2020-03-07 DIAGNOSIS — I471 Supraventricular tachycardia: Secondary | ICD-10-CM | POA: Diagnosis not present

## 2020-03-07 NOTE — Progress Notes (Signed)
Virtual Visit via Telephone Note   This visit type was conducted due to national recommendations for restrictions regarding the COVID-19 Pandemic (e.g. social distancing) in an effort to limit this patient's exposure and mitigate transmission in our community.  Due to her co-morbid illnesses, this patient is at least at moderate risk for complications without adequate follow up.  This format is felt to be most appropriate for this patient at this time.  The patient did not have access to video technology/had technical difficulties with video requiring transitioning to audio format only (telephone).  All issues noted in this document were discussed and addressed.  No physical exam could be performed with this format.  Please refer to the patient's chart for her  consent to telehealth for [facility].   Date:  03/07/2020   ID:  Sarah Horne, DOB 11/20/1955, MRN [id number]  Patient Location: Home Provider Location: Office  PCP:  Delsa Grana, PA-C  Cardiologist:  Kathlyn Sacramento, MD  Electrophysiologist:  None   Evaluation Performed:  Follow-Up Visit  Chief Complaint:  Follow-up visit.  History of Present Illness:    Sarah Horne is a 64 y.o. female was reached via phone for a follow-up visit regarding Herceptin-induced cardiomyopathy and paroxysmal supraventricular tachycardia. The patient reports possible prior myocardial infarction in 1998 where she underwent cardiac catheterization at [facility]. She did not require revascularization and she was treated medically. The details of that admission are not available. She has chronic medical conditions that include hypertension, hyperlipidemia and tobacco use .   She was diagnosed with metastatic breast cancer, with metastisis to liver and brain in 2016 for which she underwent chemotherapy. She developed chemo-induced cardiomyopathy related to past treatment of breast cancer with Herceptin in 2017. Her EF was reduced to 45% during treatment,  however subsequent echos showed improvement of EF to baseline after treatment. Latest echo in 12/2016 showed and EF of 55-60% with Grade 1 DD.  SVT has been well controlled with flecainide and metoprolol.  She has been stable from a cardiac standpoint but unfortunately, she continues to deal with metastatic breast cancer and currently undergoing chemotherapy.  She was diagnosed with DVT and currently on anticoagulation with Eliquis. She denies chest pain, shortness of breath or palpitations.   The patient does not have symptoms concerning for COVID-19 infection (fever, chills, cough, or new shortness of breath).    Past Medical History:  Diagnosis Date   Chemotherapy-induced peripheral neuropathy ([place])    Epistaxis    a. 11/2016 in setting of asa/plavix-->silver nitrate cauterization.   GI bleed    a. 11/2016 Admission w/ GIB and hypovolemic shock req 3u PRBC's;  b. 11/2016 ECG: gastritis & nonbleeding peptic ulcer; c. 11/2016 Conlonoscopy: rectal and sigmoid colonic ulcers.   Heart attack ([place])    a. 1998 Cath @ [facility]: reportedly no intervention required.   Herceptin-induced cardiomyopathy ([place])    a. In the setting of Herceptin Rx for breast cancer (initiated 12/2014); b. 03/2015 MUGA EF 64%; b. 08/2015 MUGA: EF 51%; c. 10/2015 MUGA: EF 44%; d. 11/2015 Echo: EF 45-50%; e. 01/2016 MUGA: EF 60%; f. 06/2016 MUGA EF 65%; g. 10/2016 MUGA: EF 61%;  h. 12/2016 Echo: EF 55-60%, gr1 DD.   Hyperlipidemia    Hypertension    Neuropathy    Possible PAD (peripheral artery disease) ([place])    a. 11/2016 LE cyanosis and weak pulses-->CTA w/o significant Ao-BiFem dzs. ? distal dzs-->ASA/Plavix initiated by vascular surgery.   PSVT (paroxysmal supraventricular tachycardia) ([place])  a. Dx 11/2016.   Pulmonary embolism ([place])    a. 12/2016 CTA Chest: small nonocclusive PE in inferior segment of the Left lingula, somewhat eccentric filling defect suggesting chronic rather than acute embolic event; b. 12/2593 LE U/S:   No DVT; c. 12/2016 Echo: Nl RV fxn, nl PASP.   Recurrent Metastatic breast cancer ([place])    a. Dx 2016: Stage II, ER positive, PR positive, HER-2/neu overexpressing of the left breast-->chemo/radiation; b. 10/2016 CT Abd/pelvis: diffuse liver mets, ill defined sclerotic bone lesions-T12;  c. 10/2016 MRI brain: metastatic lesion along L temporal lobe (19x11m) w/ extensive surrounding edema & 557mmidline shift to right.   [place]   Sinus tachycardia    Past Surgical History:  Procedure Laterality Date   BREAST BIOPSY Left 2016   Positive   BREAST LUMPECTOMY WITH SENTINEL LYMPH NODE BIOPSY Left 05/23/2015   Procedure: LEFT BREAST WIDE EXCISION WITH AXILLARY DISSECTION, MASTOPLASTY ;  Surgeon: JeRobert BellowMD;  Location: [facility] ORS;  Service: General;  Laterality: Left;   BREAST SURGERY Left 12/18/14   breast biopsy/INVASIVE DUCTAL CARCINOMA OF BREAST, NOTTINGHAM GRADE 2.   BREAST SURGERY  05/23/2015.   Wide excision/mastoplasty, axillary dissection. No residual invasive cancer, positive for residual DCIS. 0/2 nodes identified on axillary dissection. (no SLN by technetium or methylene blue)   CARDIAC CATHETERIZATION     COLONOSCOPY WITH PROPOFOL N/A 12/10/2016   Procedure: COLONOSCOPY WITH PROPOFOL;  Surgeon: DaLucilla LameMD;  Location: [facility] ENDOSCOPY;  Service: Endoscopy;  Laterality: N/A;   COLONOSCOPY WITH PROPOFOL N/A 02/13/2017   Procedure: COLONOSCOPY WITH PROPOFOL;  Surgeon: WoLucilla LameMD;  [facility];  Service: Endoscopy;  Laterality: N/A;   ESOPHAGOGASTRODUODENOSCOPY N/A 11/03/2019   Procedure: ESOPHAGOGASTRODUODENOSCOPY (EGD);  Surgeon: VaLin LandsmanMD;  [facility];  Service: Gastroenterology;  Laterality: N/A;   ESOPHAGOGASTRODUODENOSCOPY (EGD) WITH PROPOFOL N/A 12/08/2016   Procedure: ESOPHAGOGASTRODUODENOSCOPY (EGD) WITH PROPOFOL;  Surgeon: DaLucilla LameMD;  Location: [facility] ENDOSCOPY;  Service: Endoscopy;  Laterality: N/A;   IVC  FILTER INSERTION N/A 02/15/2017   Procedure: IVC Filter Insertion;  Surgeon: DeAlgernon HuxleyMD;  [facility];  Service: Cardiovascular;  Laterality: N/A;   PORTACATH PLACEMENT Right 12-31-14   Dr ByBary Castilla   Current Meds  Medication Sig   acetaminophen (TYLENOL) 650 MG CR tablet Take 650 mg by mouth every 8 (eight) hours as needed for pain.   apixaban (ELIQUIS) 5 MG TABS tablet Take 1 tablet (5 mg total) by mouth 2 (two) times daily.   flecainide (TAMBOCOR) 50 MG tablet Take 1 tablet (50 mg total) by mouth 2 (two) times daily.   levocetirizine (XYZAL) 5 MG tablet TAKE 1 TABLET BY MOUTH EVERY DAY IN THE EVENING   magnesium oxide (MAG-OX) 400 MG tablet TAKE 1 TABLET BY MOUTH TWICE A DAY   meclizine (ANTIVERT) 25 MG tablet Take 1 tablet (25 mg total) by mouth 3 (three) times daily as needed for dizziness.   metoprolol tartrate (LOPRESSOR) 25 MG tablet TAKE 1 TABLET BY MOUTH TWICE A DAY   montelukast (SINGULAIR) 10 MG tablet Take 1 tablet (10 mg total) by mouth at bedtime.   pregabalin (LYRICA) 100 MG capsule Take 1 capsule (100 mg total) by mouth 2 (two) times daily.   Vitamin D, Ergocalciferol, (DRISDOL) 1.25 MG (50000 UNIT) CAPS capsule TAKE 1 CAPSULE (50,000 UNITS TOTAL) ONCE A WEEK BY MOUTH.     Allergies:   No known allergies  Social History   Tobacco Use   Smoking status: Light Tobacco Smoker    Packs/day: 0.25    Years: 18.00    Pack years: 4.50    Types: Cigarettes   Smokeless tobacco: Never Used  Vaping Use   Vaping Use: Never used  Substance Use Topics   Alcohol use: No    Alcohol/week: 0.0 standard drinks   Drug use: No     Family Hx: The patient's family history includes Brain cancer in her maternal uncle; Breast cancer in her cousin and maternal aunt; Diabetes in her mother; Hypertension in her mother; Stroke in her mother.  ROS:   Please see the history of present illness.     All other systems reviewed and are  negative.   Prior CV studies:   The following studies were reviewed today:    Labs/Other Tests and Data Reviewed:    EKG:  No ECG reviewed.  Recent Labs: 10/05/2019: TSH 0.545 11/01/2019: Magnesium 2.7 02/28/2020: ALT 25; BUN 10; Creatinine, Ser 0.65; Hemoglobin 12.9; Platelets 200; Potassium 3.4; Sodium 141   Recent Lipid Panel Lab Results  Component Value Date/Time   CHOL 136 01/18/2017 09:57 AM   TRIG 127 01/18/2017 09:57 AM   HDL 19 (L) 01/18/2017 09:57 AM   CHOLHDL 7.2 (H) 01/18/2017 09:57 AM   LDLCALC 92 01/18/2017 09:57 AM    Wt Readings from Last 3 Encounters:  03/07/20 154 lb (69.9 kg)  02/28/20 148 lb (67.1 kg)  02/12/20 150 lb (68 kg)     Objective:    Vital Signs:  Ht 5' 6"  (1.676 m)    Wt 154 lb (69.9 kg)    BMI 24.86 kg/m    VITAL SIGNS:  reviewed  ASSESSMENT & PLAN:    1.  Paroxysmal supraventricular tachycardia: No recurrent arrhythmia on current dose of flecainide 50 mg twice daily.  2. Chemotherapy induced cardiomyopathy:   This was due to Herceptin with subsequent improvement in LV systolic function to normal.  3. Essential hypertension: she is no longer having episodes of hypotension and she is currently not on any antihypertensive medications.  4.  Metastatic breast cancer, followed closely by oncology.     Time:   Today, I have spent 5 minutes with the patient with telehealth technology discussing the above problems.     Medication Adjustments/Labs and Tests Ordered: Current medicines are reviewed at length with the patient today.  Concerns regarding medicines are outlined above.   Tests Ordered: No orders of the defined types were placed in this encounter.   Medication Changes: No orders of the defined types were placed in this encounter.   Follow Up:  In Person in 6 month(s)  Signed, Kathlyn Sacramento, MD  03/07/2020 9:30 AM    [place]

## 2020-03-07 NOTE — Patient Instructions (Signed)
Medication Instructions:  Your physician recommends that you continue on your current medications as directed. Please refer to the Current Medication list given to you today.  *If you need a refill on your cardiac medications before your next appointment, please call your pharmacy*   Lab Work: None ordered If you have labs (blood work) drawn today and your tests are completely normal, you will receive your results only by: Marland Kitchen MyChart Message (if you have MyChart) OR . A paper copy in the mail If you have any lab test that is abnormal or we need to change your treatment, we will call you to review the results.   Testing/Procedures: None ordered   Follow-Up: At [facility], you and your health needs are our priority.  As part of our continuing mission to provide you with exceptional heart care, we have created designated Provider Care Teams.  These Care Teams include your primary Cardiologist (physician) and Advanced Practice Providers (APPs -  Physician Assistants and Nurse Practitioners) who all work together to provide you with the care you need, when you need it.  We recommend signing up for the patient portal called "MyChart".  Sign up information is provided on this After Visit Summary.  MyChart is used to connect with patients for Virtual Visits (Telemedicine).  Patients are able to view lab/test results, encounter notes, upcoming appointments, etc.  Non-urgent messages can be sent to your provider as well.   To learn more about what you can do with MyChart, go to [email or web address].    Your next appointment:   6 month(s) with an EKG  The format for your next appointment:   In Person  Provider:    You may see Kathlyn Sacramento, MD or one of the following Advanced Practice Providers on your designated Care Team:    Murray Hodgkins, NP  Christell Faith, PA-C  Marrianne Mood, PA-C    Other Instructions N/A

## 2020-03-10 ENCOUNTER — Other Ambulatory Visit: Payer: Self-pay | Admitting: Internal Medicine

## 2020-03-13 ENCOUNTER — Other Ambulatory Visit: Payer: Self-pay

## 2020-03-13 ENCOUNTER — Ambulatory Visit: Payer: Medicare HMO

## 2020-03-13 ENCOUNTER — Other Ambulatory Visit: Payer: Medicare HMO

## 2020-03-13 ENCOUNTER — Ambulatory Visit: Payer: Medicare HMO | Admitting: Internal Medicine

## 2020-03-13 MED ORDER — FLECAINIDE ACETATE 50 MG PO TABS: 50.0000 mg | ORAL_TABLET | Freq: Two times a day (BID) | ORAL | 4 refills | Status: DC

## 2020-03-14 ENCOUNTER — Inpatient Hospital Stay (HOSPITAL_BASED_OUTPATIENT_CLINIC_OR_DEPARTMENT_OTHER): Payer: Medicare HMO | Admitting: Internal Medicine

## 2020-03-14 ENCOUNTER — Inpatient Hospital Stay: Payer: Medicare HMO

## 2020-03-14 ENCOUNTER — Encounter: Payer: Self-pay | Admitting: Internal Medicine

## 2020-03-14 ENCOUNTER — Other Ambulatory Visit: Payer: Self-pay

## 2020-03-14 VITALS — BP 133/95 | HR 90

## 2020-03-14 VITALS — BP 100/81 | Temp 96.3°F | Wt 144.5 lb

## 2020-03-14 DIAGNOSIS — C7931 Secondary malignant neoplasm of brain: Secondary | ICD-10-CM | POA: Diagnosis not present

## 2020-03-14 DIAGNOSIS — R7989 Other specified abnormal findings of blood chemistry: Secondary | ICD-10-CM | POA: Diagnosis not present

## 2020-03-14 DIAGNOSIS — Z7189 Other specified counseling: Secondary | ICD-10-CM

## 2020-03-14 DIAGNOSIS — C50412 Malignant neoplasm of upper-outer quadrant of left female breast: Secondary | ICD-10-CM

## 2020-03-14 DIAGNOSIS — C7800 Secondary malignant neoplasm of unspecified lung: Secondary | ICD-10-CM | POA: Diagnosis not present

## 2020-03-14 DIAGNOSIS — Z5189 Encounter for other specified aftercare: Secondary | ICD-10-CM | POA: Diagnosis not present

## 2020-03-14 DIAGNOSIS — Z5111 Encounter for antineoplastic chemotherapy: Secondary | ICD-10-CM | POA: Diagnosis not present

## 2020-03-14 DIAGNOSIS — C787 Secondary malignant neoplasm of liver and intrahepatic bile duct: Secondary | ICD-10-CM | POA: Diagnosis not present

## 2020-03-14 DIAGNOSIS — Z170 Estrogen receptor positive status [ER+]: Secondary | ICD-10-CM | POA: Diagnosis not present

## 2020-03-14 DIAGNOSIS — F17210 Nicotine dependence, cigarettes, uncomplicated: Secondary | ICD-10-CM | POA: Diagnosis not present

## 2020-03-14 LAB — CBC WITH DIFFERENTIAL/PLATELET
Abs Immature Granulocytes: 0.02 10*3/uL (ref 0.00–0.07)
Basophils Absolute: 0 10*3/uL (ref 0.0–0.1)
Basophils Relative: 0 %
Eosinophils Absolute: 0.1 10*3/uL (ref 0.0–0.5)
Eosinophils Relative: 1 %
HCT: 41.2 % (ref 36.0–46.0)
Hemoglobin: 13.9 g/dL (ref 12.0–15.0)
Immature Granulocytes: 0 %
Lymphocytes Relative: 17 %
Lymphs Abs: 0.9 10*3/uL (ref 0.7–4.0)
MCH: 32.2 pg (ref 26.0–34.0)
MCHC: 33.7 g/dL (ref 30.0–36.0)
MCV: 95.4 fL (ref 80.0–100.0)
Monocytes Absolute: 0.5 10*3/uL (ref 0.1–1.0)
Monocytes Relative: 10 %
Neutro Abs: 3.7 10*3/uL (ref 1.7–7.7)
Neutrophils Relative %: 72 %
Platelets: 155 10*3/uL (ref 150–400)
RBC: 4.32 MIL/uL (ref 3.87–5.11)
RDW: 19.5 % — ABNORMAL HIGH (ref 11.5–15.5)
WBC: 5.2 10*3/uL (ref 4.0–10.5)
nRBC: 0 % (ref 0.0–0.2)

## 2020-03-14 LAB — COMPREHENSIVE METABOLIC PANEL
ALT: 50 U/L — ABNORMAL HIGH (ref 0–44)
AST: 72 U/L — ABNORMAL HIGH (ref 15–41)
Albumin: 3.8 g/dL (ref 3.5–5.0)
Alkaline Phosphatase: 346 U/L — ABNORMAL HIGH (ref 38–126)
Anion gap: 9 (ref 5–15)
BUN: 16 mg/dL (ref 8–23)
CO2: 28 mmol/L (ref 22–32)
Calcium: 8.9 mg/dL (ref 8.9–10.3)
Chloride: 101 mmol/L (ref 98–111)
Creatinine, Ser: 0.68 mg/dL (ref 0.44–1.00)
GFR calc Af Amer: >60 mL/min (ref >60)
GFR calc non Af Amer: >60 mL/min (ref >60)
Glucose, Bld: 118 mg/dL — ABNORMAL HIGH (ref 70–99)
Potassium: 4 mmol/L (ref 3.5–5.1)
Sodium: 138 mmol/L (ref 135–145)
Total Bilirubin: 1.2 mg/dL (ref 0.3–1.2)
Total Protein: 7.4 g/dL (ref 6.5–8.1)

## 2020-03-14 MED ORDER — HEPARIN SOD (PORK) LOCK FLUSH 100 UNIT/ML IV SOLN
INTRAVENOUS | Status: AC
  Filled 2020-03-14: qty 5

## 2020-03-14 MED ORDER — HEPARIN SOD (PORK) LOCK FLUSH 100 UNIT/ML IV SOLN
500.0000 [IU] | Freq: Once | INTRAVENOUS | Status: AC | PRN
  Administered 2020-03-14: 500 [IU]
  Filled 2020-03-14: qty 5

## 2020-03-14 MED ORDER — PROCHLORPERAZINE MALEATE 10 MG PO TABS
10.0000 mg | ORAL_TABLET | Freq: Once | ORAL | Status: AC
  Administered 2020-03-14: 10 mg via ORAL
  Filled 2020-03-14: qty 1

## 2020-03-14 MED ORDER — PEGFILGRASTIM 6 MG/0.6ML ~~LOC~~ PSKT
6.0000 mg | PREFILLED_SYRINGE | Freq: Once | SUBCUTANEOUS | Status: AC
  Administered 2020-03-14: 6 mg via SUBCUTANEOUS
  Filled 2020-03-14: qty 0.6

## 2020-03-14 MED ORDER — SODIUM CHLORIDE 0.9 % IV SOLN
1800.0000 mg | Freq: Once | INTRAVENOUS | Status: AC
  Administered 2020-03-14: 1800 mg via INTRAVENOUS
  Filled 2020-03-14: qty 26.3

## 2020-03-14 MED ORDER — SODIUM CHLORIDE 0.9 % IV SOLN
Freq: Once | INTRAVENOUS | Status: AC
  Filled 2020-03-14: qty 250

## 2020-03-14 NOTE — Assessment & Plan Note (Addendum)
#  Recurrent metastatic breast cancer-lung/liver metastases ER PR positive HER-2 negative; CT scan March second 2021-partial response noted-improved liver lesions; lung lesion.  No new lesions.  Currently gemcitabine single agent.  # proceed with gemcitabine #6 every 2 weeks[sec to neutropenia/on pro].  Labs today reviewed;  acceptable for treatment today. Will proceed with chemo today.  Plan CT scan chest and pelvis prior to next visit.  #Gait instability/visual disturbance-[Dr.Shah]-clinically- STABLE; S/p EEG [5/13]- negative for seizures.  # cardiomyopathy/ Sinus tachycardia- on metoprolol-/Tamabcor; [march 2d-echo- 40-45%];  STABLE.    # Elevated LFTs-likely secondary to progressive malignancy- slowly rising; await CT scan.   # Peripheral neuropathy grade 1-2-  on Lyrica; STABLE>    # weight loss/ poor appetite; sec to malignancy/ chemo- recommend eval to [facility]; discussed dietary interventions.   # Right lower extremity DVT bilateral-November 28, 2019- STABLE  On eliquis.   # Bilateral ear wax- difficulty with hearing on right-; wants to try home kit first; if not improved the-  will defer to PCP.   # DISPOSITION:  # gemcitabine/onprpo today;  # 2 weeks MD; labs- cbc/cmp;GEM- onpro; CT C/A/P--Dr.B

## 2020-03-14 NOTE — Progress Notes (Signed)
BP 133/99. Dr. Jacinto Reap and team made aware. Okay to discharge home. Patient denies any s/s. Educated on s/s to go to ED. Patient verbalizes understanding and denies any further questions or concerns.

## 2020-03-14 NOTE — Progress Notes (Signed)
[facility] OFFICE PROGRESS NOTE  Patient Care Team: Delsa Grana, PA-C as PCP - General (Family Medicine) Wellington Hampshire, MD as PCP - Cardiology (Cardiology) Bary Castilla Forest Gleason, MD (General Surgery) Vladimir Crofts, MD as Consulting Physician (Neurology)  Cancer Staging No matching staging information was found for the patient.   Oncology History Overview Note  # LEFT BREAST IDC; STAGE II [cT2N1] ER >90%; PR- 50-90%; her 2 NEU- POS; s/p Neoadj chemo; AUG 2016- TCH+P s/p Lumpec & partial ALND- path CR; s/p RT [finished Nov 2016];  adj Herceptin; HELD for Jan 19th 2017 [in DEC 2016-EF dropped from 63 to 51%; FEB 27th EF-42.9%]; JAN 2016 START Arimidex; May 2017- EF- 60%; May 24th 2017-Re-start Herceptin q 3W; July 28th STOP herceptin [finished 31m/m2; sec to Low EF; however 2017 Oct EF= 67%; improved]  # DEC 4th 2017- Start Neratinib 4 pills; DEC 11th 3 pills; STOPPED.  # FEB 2018- RECURRENCE ER/PR positive; Her 2 NEGATIVE [liver Bx]  # MARCH 1st 2018- Tax-Cytoxan [poor tol]  # FEB 2018- Brain mets [SBRT; Dr.Crystal; finished Feb 28th 2018]  # March 2018- Eribulin- poor tolerance  # June 8th 2018- Started Faslodex + Abema [abema-multiple interruptions sec to neutropenia]  # MRI Brain- March 2019- Leptomeningeal mets [WBRT; No LP s/p WBRT- 01/09/2018]; STOP Abema [551mday-sec to severe cytopenia]+faslodex  # MAY 20tth 019- Start Xeloda; STOPPED in Nov 22nd 2019- sec to Progressive liver lesions on CT scan  # NOV 26th 2019- Faslodex + Piqaray 25065m; Jan 2020- Piqray 300 mg+ faslodex; FEB 17th CT scan- mixed response/ Overall progression; STOP Piqray + faslodex.   # FEB 25th 2020- ERIBULIN; Poor tolerance sec to severe neutropenia.  Stopped May 2020.  CT scan May 2020-new/progressive 2 cm liver lesion;   #July nd 2020-Ixempra x5 cycles-; NOV 2020-MRI liver- worsening liver lesions [rising AST/ALT].   # NOV [id number] weekly [consent-]; December  31-2020-progression/rising LFTs.  Stop Taxol  #January 15th, 2021-Adriamycin weekly [C]; MARCH 2021- Partial response. STOP ADRIAMYCIN sec to EF drop 40-45%.   # April 2nd-2021-gemcitabine; 01/15/2020-GEM [800 mg/m2; #3 cycle 1000m51m] q 2 W; with onpro.   #May 06, 2019-MRI brain 5 new lesions [3 mm to 10 mm]-asymptomatic; [previous whole brain radiation]- OCT 2020- Re-Irradiation.   # PN- G-2; may 2017- Cymbalta 60mg67mARCH 2018-  acute vascular insuff of BIL LE [? Taxotere vasoconstriction on asprin]; # hemorragic shock LGIB [s/p colo; Dr.Wohl]  #  SVT- on flecainide.   # April 2016- Liver Bx- NEG;   # Drop in EF from Herceptin [recovered OCt 27th- EF 65%]; March 2020 ejection fraction 40 to 45%.  Stop Adriamycin.  # BMD- jan 2017- osteopenia  # 2020- SEP-s/p Pallaitive care evaluation  -----------------------------------------------------------     [place]th [place]- PDL-1 0%/NEG for BRCA; MSI-Stable; Positive for PI3K; ccnd-1; FGF** -----------------------------------------------------------  Dx: Metastatic Breast cancer [ER/PR-Pos; her -2 NEG] Stage IV; goals: palliative Current treatment-gemcitabine chemotherapy [C]   Carcinoma of upper-outer quadrant of left breast in female, estrogen receptor positive (HCC) [place]25/2020 - 01/17/2019 Chemotherapy   The patient had eriBULin mesylate (HALAVEN) 2.1 mg in sodium chloride 0.9 % 100 mL chemo infusion, 1.2 mg/m2 = 2.1 mg (100 % of original dose 1.2 mg/m2), Intravenous,  Once, 3 of 4 cycles Dose modification: 1.2 mg/m2 (original dose 1.2 mg/m2, Cycle 1, Reason: Provider Judgment), 0.8 mg/m2 (original dose 1.2 mg/m2, Cycle 3, Reason: Provider Judgment) Administration: 2.1 mg (11/22/2018), 2.1 mg (11/29/2018), 2.1 mg (12/20/2018),  1.4 mg (01/17/2019)  for chemotherapy treatment.    03/30/2019 - 07/12/2019 Chemotherapy   The patient had pegfilgrastim (NEULASTA ONPRO KIT) injection 6 mg, 6 mg, Subcutaneous,  Once, 5 of 8 cycles Administration: 6 mg (03/30/2019), 6 mg (04/20/2019), 6 mg (05/11/2019), 6 mg (06/22/2019), 6 mg (06/01/2019) ixabepilone (IXEMPRA) 60 mg in lactated ringers 250 mL chemo infusion, 33.5 mg/m2 = 57 mg (100 % of original dose 32 mg/m2), Intravenous,  Once, 5 of 8 cycles Dose modification: 32 mg/m2 (original dose 32 mg/m2, Cycle 1, Reason: Provider Judgment) Administration: 72 mg (04/20/2019), 72 mg (05/11/2019), 72 mg (06/22/2019), 72 mg (06/01/2019)  for chemotherapy treatment.    08/18/2019 - 09/28/2019 Chemotherapy   The patient had PACLitaxel (TAXOL) 150 mg in sodium chloride 0.9 % 250 mL chemo infusion (</= 56m/m2), 80 mg/m2 = 150 mg, Intravenous,  Once, 1 of 4 cycles Administration: 150 mg (08/18/2019), 150 mg (09/08/2019), 150 mg (09/15/2019)  for chemotherapy treatment.    10/13/2019 - 11/30/2019 Chemotherapy   The patient had DOXOrubicin (ADRIAMYCIN) chemo injection 38 mg, 20 mg/m2 = 38 mg, Intravenous,  Once, 6 of 9 cycles Administration: 38 mg (10/13/2019), 38 mg (10/20/2019), 38 mg (10/27/2019), 38 mg (11/17/2019), 38 mg (11/24/2019), 38 mg (11/10/2019) palonosetron (ALOXI) injection 0.25 mg, 0.25 mg, Intravenous,  Once, 6 of 9 cycles Administration: 0.25 mg (10/13/2019), 0.25 mg (10/20/2019), 0.25 mg (10/27/2019), 0.25 mg (11/17/2019), 0.25 mg (11/24/2019), 0.25 mg (11/10/2019)  for chemotherapy treatment.    12/29/2019 -  Chemotherapy   The patient had pegfilgrastim (NEULASTA ONPRO KIT) injection 6 mg, 6 mg, Subcutaneous, Once, 5 of 7 cycles Administration: 6 mg (01/15/2020), 6 mg (01/29/2020), 6 mg (02/12/2020), 6 mg (02/28/2020) gemcitabine (GEMZAR) 1,444 mg in sodium chloride 0.9 % 250 mL chemo infusion, 800 mg/m2 = 1,444 mg, Intravenous,  Once, 6 of 8 cycles Dose modification: 1,000 mg/m2 (original dose 800 mg/m2, Cycle 3, Reason: Provider Judgment) Administration: 1,444 mg (12/29/2019), 1,444 mg (01/15/2020), 1,800 mg (01/29/2020), 1,800 mg (02/12/2020), 1,800 mg (02/28/2020), 1,800 mg  (03/14/2020)  for chemotherapy treatment.      INTERVAL HISTORY:  Sarah BRUNING89y.o.  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  Complains of difficulty hearing bilaterally-left more than right.   She continues to be on Eliquis for DVT.  Denies any falls.  Denies any worsening swelling of the legs.  Continues weight loss.  No headaches.  No nausea no vomiting.  Review of Systems  Constitutional: Positive for malaise/fatigue and weight loss. Negative for chills, diaphoresis and fever.  HENT: Negative for nosebleeds and sore throat.   Eyes: Negative for double vision.  Respiratory: Negative for cough, hemoptysis, sputum production, shortness of breath and wheezing.   Cardiovascular: Negative for chest pain, palpitations, orthopnea and leg swelling.  Gastrointestinal: Positive for constipation. Negative for abdominal pain, blood in stool, heartburn, melena, nausea and vomiting.  Genitourinary: Negative for dysuria, frequency and urgency.  Musculoskeletal: Positive for joint pain. Negative for back pain.  Skin: Negative for itching.  Neurological: Positive for dizziness and tingling. Negative for focal weakness, weakness and headaches.  Endo/Heme/Allergies: Does not bruise/bleed easily.  Psychiatric/Behavioral: Negative for depression. The patient is not nervous/anxious and does not have insomnia.     PAST MEDICAL HISTORY :  Past Medical History:  Diagnosis Date  . Chemotherapy-induced peripheral neuropathy ([place]   . Epistaxis    a. 11/2016 in setting of asa/plavix-->silver  nitrate cauterization.  . GI bleed    a. 11/2016 Admission w/ GIB and hypovolemic shock req 3u PRBC's;  b. 11/2016 ECG: gastritis & nonbleeding peptic ulcer; c. 11/2016 Conlonoscopy: rectal and sigmoid colonic ulcers.  . Heart attack ([place])    a. 1998 Cath @ [facility]: reportedly no  intervention required.  Marland Kitchen Herceptin-induced cardiomyopathy ([place])    a. In the setting of Herceptin Rx for breast cancer (initiated 12/2014); b. 03/2015 MUGA EF 64%; b. 08/2015 MUGA: EF 51%; c. 10/2015 MUGA: EF 44%; d. 11/2015 Echo: EF 45-50%; e. 01/2016 MUGA: EF 60%; f. 06/2016 MUGA EF 65%; g. 10/2016 MUGA: EF 61%;  h. 12/2016 Echo: EF 55-60%, gr1 DD.  Marland Kitchen Hyperlipidemia   . Hypertension   . Neuropathy   . Possible PAD (peripheral artery disease) ([place])    a. 11/2016 LE cyanosis and weak pulses-->CTA w/o significant Ao-BiFem dzs. ? distal dzs-->ASA/Plavix initiated by vascular surgery.  Marland Kitchen PSVT (paroxysmal supraventricular tachycardia) ([place])    a. Dx 11/2016.  . Pulmonary embolism ([place])    a. 12/2016 CTA Chest: small nonocclusive PE in inferior segment of the Left lingula, somewhat eccentric filling defect suggesting chronic rather than acute embolic event; b. 0/3500 LE U/S:  No DVT; c. 12/2016 Echo: Nl RV fxn, nl PASP.  Marland Kitchen Recurrent Metastatic breast cancer ([place])    a. Dx 2016: Stage II, ER positive, PR positive, HER-2/neu overexpressing of the left breast-->chemo/radiation; b. 10/2016 CT Abd/pelvis: diffuse liver mets, ill defined sclerotic bone lesions-T12;  c. 10/2016 MRI brain: metastatic lesion along L temporal lobe (19x74m) w/ extensive surrounding edema & 57mmidline shift to right.  . Sepsis ([place]  . Sinus tachycardia     PAST SURGICAL HISTORY :   Past Surgical History:  Procedure Laterality Date  . BREAST BIOPSY Left 2016   Positive  . BREAST LUMPECTOMY WITH SENTINEL LYMPH NODE BIOPSY Left 05/23/2015   Procedure: LEFT BREAST WIDE EXCISION WITH AXILLARY DISSECTION, MASTOPLASTY ;  Surgeon: JeRobert BellowMD;  Location: [facility] ORS;  Service: General;  Laterality: Left;  . BREAST SURGERY Left 12/18/14   breast biopsy/INVASIVE DUCTAL CARCINOMA OF BREAST, NOTTINGHAM GRADE 2.  . Marland KitchenREAST SURGERY  05/23/2015.   Wide excision/mastoplasty, axillary dissection. No residual invasive cancer, positive for  residual DCIS. 0/2 nodes identified on axillary dissection. (no SLN by technetium or methylene blue)  . CARDIAC CATHETERIZATION    . COLONOSCOPY WITH PROPOFOL N/A 12/10/2016   Procedure: COLONOSCOPY WITH PROPOFOL;  Surgeon: DaLucilla LameMD;  Location: [facility] ENDOSCOPY;  Service: Endoscopy;  Laterality: N/A;  . COLONOSCOPY WITH PROPOFOL N/A 02/13/2017   Procedure: COLONOSCOPY WITH PROPOFOL;  Surgeon: WoLucilla LameMD;  [facility];  Service: Endoscopy;  Laterality: N/A;  . ESOPHAGOGASTRODUODENOSCOPY N/A 11/03/2019   Procedure: ESOPHAGOGASTRODUODENOSCOPY (EGD);  Surgeon: VaLin LandsmanMD;  [facility];  Service: Gastroenterology;  Laterality: N/A;  . ESOPHAGOGASTRODUODENOSCOPY (EGD) WITH PROPOFOL N/A 12/08/2016   Procedure: ESOPHAGOGASTRODUODENOSCOPY (EGD) WITH PROPOFOL;  Surgeon: DaLucilla LameMD;  Location: [facility] END[facility]COPY;  Service: Endoscopy;  Laterality: N/A;  . IVC FILTER INSERTION N/A 02/15/2017   Procedure: IVC Filter Insertion;  Surgeon: DeAlgernon HuxleyMD;  [facility];  Service: Cardiovascular;  Laterality: N/A;  . PORTACATH PLACEMENT Right 12-31-14   Dr ByBary Castilla  FAMILY HISTORY :   Family History  Problem Relation Age of Onset  . Breast cancer Maternal Aunt   . Breast cancer Cousin   . Brain cancer Maternal Uncle   .  Diabetes Mother   . Hypertension Mother   . Stroke Mother     SOCIAL HISTORY:   Social History   Tobacco Use  . Smoking status: Light Tobacco Smoker    Packs/day: 0.25    Years: 18.00    Pack years: 4.50    Types: Cigarettes  . Smokeless tobacco: Never Used  Vaping Use  . Vaping Use: Never used  Substance Use Topics  . Alcohol use: No    Alcohol/week: 0.0 standard drinks  . Drug use: No    ALLERGIES:  is allergic to no known allergies.  MEDICATIONS:  Current Outpatient Medications  Medication Sig Dispense Refill  . acetaminophen (TYLENOL) 650 MG CR tablet Take 650 mg by mouth every 8 (eight) hours as  needed for pain.    Marland Kitchen apixaban (ELIQUIS) 5 MG TABS tablet Take 1 tablet (5 mg total) by mouth 2 (two) times daily. 60 tablet 4  . flecainide (TAMBOCOR) 50 MG tablet Take 1 tablet (50 mg total) by mouth 2 (two) times daily. 60 tablet 4  . magnesium oxide (MAG-OX) 400 MG tablet TAKE 1 TABLET BY MOUTH TWICE A DAY 60 tablet 0  . metoprolol tartrate (LOPRESSOR) 25 MG tablet TAKE 1 TABLET BY MOUTH TWICE A DAY 60 tablet 1  . Vitamin D, Ergocalciferol, (DRISDOL) 1.25 MG (50000 UNIT) CAPS capsule TAKE 1 CAPSULE (50,000 UNITS TOTAL) ONCE A WEEK BY MOUTH. 12 capsule 1  . ipratropium (ATROVENT) 0.03 % nasal spray Place 2 sprays into both nostrils every 12 (twelve) hours. (Patient not taking: Reported on 03/07/2020) 30 mL 12  . levocetirizine (XYZAL) 5 MG tablet TAKE 1 TABLET BY MOUTH EVERY DAY IN THE EVENING (Patient not taking: Reported on 03/14/2020) 90 tablet 1  . meclizine (ANTIVERT) 25 MG tablet Take 1 tablet (25 mg total) by mouth 3 (three) times daily as needed for dizziness. (Patient not taking: Reported on 03/14/2020) 30 tablet 0  . montelukast (SINGULAIR) 10 MG tablet Take 1 tablet (10 mg total) by mouth at bedtime. 60 tablet 3  . ondansetron (ZOFRAN) 8 MG tablet One pill every 8 hours as needed for nausea/vomitting. (Patient not taking: Reported on 03/07/2020) 40 tablet 1  . pregabalin (LYRICA) 100 MG capsule TAKE 1 CAPSULE BY MOUTH TWICE A DAY 60 capsule 2  . prochlorperazine (COMPAZINE) 10 MG tablet Take 1 tablet (10 mg total) by mouth every 6 (six) hours as needed for nausea or vomiting. (Patient not taking: Reported on 03/07/2020) 40 tablet 1   No current facility-administered medications for this visit.   Facility-Administered Medications Ordered in Other Visits  Medication Dose Route Frequency Provider Last Rate Last Admin  . 0.9 %  sodium chloride infusion   Intravenous Continuous [name], MD   New Bag at 12/10/16 1140  . 0.9 %  sodium chloride infusion   Intravenous Continuous Lloyd Huger, MD 999 mL/hr at 04/24/15 1520 New Bag at 05/23/15 0951  . 0.9 %  sodium chloride infusion   Intravenous Continuous Verlon Au, NP 999 mL/hr at 10/31/19 1522 New Bag at 10/31/19 1522  . heparin lock flush 100 unit/mL  500 Units Intravenous Once Berenzon, Dmitriy, MD      . sodium chloride 0.9 % injection 10 mL  10 mL Intravenous PRN [name], MD   10 mL at 04/04/15 1440  . sodium chloride flush (NS) 0.9 % injection 10 mL  10 mL Intravenous PRN Roxana Hires, MD        PHYSICAL EXAMINATION:  ECOG PERFORMANCE STATUS: 1 - Symptomatic but completely ambulatory  BP 100/81 (BP Location: Right Arm, Patient Position: Sitting)   Temp (!) 96.3 F (35.7 C) (Tympanic)   Wt 144 lb 8 oz (65.5 kg)   BMI 23.32 kg/m   Filed Weights   03/14/20 1058  Weight: 144 lb 8 oz (65.5 kg)    Physical Exam Constitutional:      Comments: Patient is alone.  In wheel chair.   HENT:     Head: Normocephalic and atraumatic.     Mouth/Throat:     Pharynx: No oropharyngeal exudate.  Eyes:     Pupils: Pupils are equal, round, and reactive to light.  Cardiovascular:     Rate and Rhythm: Normal rate and regular rhythm.  Pulmonary:     Effort: Pulmonary effort is normal. No respiratory distress.     Breath sounds: Normal breath sounds. No wheezing.  Abdominal:     General: Bowel sounds are normal. There is no distension.     Palpations: Abdomen is soft. There is no mass.     Tenderness: There is no abdominal tenderness. There is no guarding or rebound.  Musculoskeletal:        General: No tenderness. Normal range of motion.     Cervical back: Normal range of motion and neck supple.  Skin:    General: Skin is warm.  Neurological:     Mental Status: She is alert and oriented to person, place, and time.     Comments: Broad based gait.  Unsteady.  Psychiatric:        Mood and Affect: Affect normal.     LABORATORY DATA:  I have reviewed the data as listed    Component Value  Date/Time   NA 138 03/14/2020 1044   NA 135 03/04/2017 0918   NA 135 01/08/2015 0850   K 4.0 03/14/2020 1044   K 3.7 01/08/2015 0850   CL 101 03/14/2020 1044   CL 101 01/08/2015 0850   CO2 28 03/14/2020 1044   CO2 26 01/08/2015 0850   GLUCOSE 118 (H) 03/14/2020 1044   GLUCOSE 161 (H) 01/08/2015 0850   BUN 16 03/14/2020 1044   BUN 8 03/04/2017 0918   BUN 17 01/08/2015 0850   CREATININE 0.68 03/14/2020 1044   CREATININE 0.82 01/22/2015 1559   CALCIUM 8.9 03/14/2020 1044   CALCIUM 9.3 01/08/2015 0850   PROT 7.4 03/14/2020 1044   PROT 6.4 03/04/2017 0918   PROT 6.8 01/22/2015 1559   ALBUMIN 3.8 03/14/2020 1044   ALBUMIN 2.5 (L) 03/04/2017 0918   ALBUMIN 3.9 01/22/2015 1559   AST 72 (H) 03/14/2020 1044   AST 34 01/22/2015 1559   ALT 50 (H) 03/14/2020 1044   ALT 42 01/22/2015 1559   ALKPHOS 346 (H) 03/14/2020 1044   ALKPHOS 157 (H) 01/22/2015 1559   BILITOT 1.2 03/14/2020 1044   BILITOT 6.2 (H) 03/04/2017 0918   BILITOT 0.2 (L) 01/22/2015 1559   GFRNONAA >60 03/14/2020 1044   GFRNONAA >60 01/22/2015 1559   GFRAA >60 03/14/2020 1044   GFRAA >60 01/22/2015 1559    No results found for: SPEP, UPEP  Lab Results  Component Value Date   WBC 5.2 03/14/2020   NEUTROABS 3.7 03/14/2020   HGB 13.9 03/14/2020   HCT 41.2 03/14/2020   MCV 95.4 03/14/2020   PLT 155 03/14/2020      Chemistry      Component Value Date/Time   NA 138 03/14/2020 1044   NA 135  03/04/2017 0918   NA 135 01/08/2015 0850   K 4.0 03/14/2020 1044   K 3.7 01/08/2015 0850   CL 101 03/14/2020 1044   CL 101 01/08/2015 0850   CO2 28 03/14/2020 1044   CO2 26 01/08/2015 0850   BUN 16 03/14/2020 1044   BUN 8 03/04/2017 0918   BUN 17 01/08/2015 0850   CREATININE 0.68 03/14/2020 1044   CREATININE 0.82 01/22/2015 1559      Component Value Date/Time   CALCIUM 8.9 03/14/2020 1044   CALCIUM 9.3 01/08/2015 0850   ALKPHOS 346 (H) 03/14/2020 1044   ALKPHOS 157 (H) 01/22/2015 1559   AST 72 (H) 03/14/2020 1044    AST 34 01/22/2015 1559   ALT 50 (H) 03/14/2020 1044   ALT 42 01/22/2015 1559   BILITOT 1.2 03/14/2020 1044   BILITOT 6.2 (H) 03/04/2017 0918   BILITOT 0.2 (L) 01/22/2015 1559       RADIOGRAPHIC STUDIES: I have personally reviewed the radiological images as listed and agreed with the findings in the report. No results found.   ASSESSMENT & PLAN:  Carcinoma of upper-outer quadrant of left breast in female, estrogen receptor positive ([place]) # Recurrent metastatic breast cancer-lung/liver metastases ER PR positive HER-2 negative; CT scan March second 2021-partial response noted-improved liver lesions; lung lesion.  No new lesions.  Currently gemcitabine single agent.  # proceed with gemcitabine #6 every 2 weeks[sec to neutropenia/on pro].  Labs today reviewed;  acceptable for treatment today. Will proceed with chemo today.  Plan CT scan chest and pelvis prior to next visit.  #Gait instability/visual disturbance-[Dr.Shah]-clinically- STABLE; S/p EEG [5/13]- negative for seizures.  # cardiomyopathy/ Sinus tachycardia- on metoprolol-/Tamabcor; [march 2d-echo- 40-45%];  STABLE.    # Elevated LFTs-likely secondary to progressive malignancy- slowly rising; await CT scan.   # Peripheral neuropathy grade 1-2-  on Lyrica; STABLE>    # weight loss/ poor appetite; sec to malignancy/ chemo- recommend eval to [facility]; discussed dietary interventions.   # Right lower extremity DVT bilateral-November 28, 2019- STABLE  On eliquis.   # Bilateral ear wax- difficulty with hearing on right-; wants to try home kit first; if not improved the-  will defer to PCP.   # DISPOSITION:  # gemcitabine/onprpo today;  # 2 weeks MD; labs- cbc/cmp;GEM- onpro; CT C/A/P--Dr.B     Orders Placed This Encounter  Procedures  . CT Abdomen Pelvis W Contrast    Standing Status:   Future    Standing Expiration Date:   03/14/2021    Order Specific Question:   ** REASON FOR EXAM (FREE TEXT)    Answer:   breats cancer;  metastatic    Order Specific Question:   If indicated for the ordered procedure, I authorize the administration of contrast media per Radiology protocol    Answer:   Yes    Order Specific Question:   Preferred imaging location?    Answer:   [facility]    Order Specific Question:   Is Oral Contrast requested for this exam?    Answer:   Yes, Per Radiology protocol    Order Specific Question:   Radiology Contrast Protocol - do NOT remove file path    Answer:   \\[email or web address]  . CT Chest W Contrast    Standing Status:   Future    Standing Expiration Date:   03/14/2021    Order Specific Question:   ** REASON FOR EXAM (FREE TEXT)    Answer:   breast cancer; metatstatic to  lung    Order Specific Question:   If indicated for the ordered procedure, I authorize the administration of contrast media per Radiology protocol    Answer:   Yes    Order Specific Question:   Preferred imaging location?    Answer:    [facility]    Order Specific Question:   Radiology Contrast Protocol - do NOT remove file path    Answer:   \\[email or web address]   All questions were answered. The patient knows to call the clinic with any problems, questions or concerns.     Cammie Sickle, MD 03/21/2020 8:13 AM

## 2020-03-16 ENCOUNTER — Telehealth: Payer: Self-pay | Admitting: Family Medicine

## 2020-03-16 DIAGNOSIS — G620 Drug-induced polyneuropathy: Secondary | ICD-10-CM

## 2020-03-19 NOTE — Telephone Encounter (Signed)
lvm letting pt know refill request has been completed and for pt to schedule appt (it can be virtual) or to see if she can get this medication switched to her pallative care doctor.

## 2020-03-21 ENCOUNTER — Inpatient Hospital Stay: Payer: Medicare HMO

## 2020-03-21 NOTE — Progress Notes (Signed)
Nutrition Follow-up:  Patient with metastatic breast cancer with leptomenigeal disease to brain.  Patient receiving chemotherapy.   Spoke with patient via phone.  Patient reports that her appetite is not so good but then tells RD that she is eating better as thought she would have gained weight.  Says that she has to depend on others to take her to get groceries.  If she runs out of bread to make a sandwich she just want it.  Says that she is going to make grilled cheese sandwich when we get off the phone.  Likes ensure and drinks them when she has them available.      Medications: reviewed  Labs: reviewed  Anthropometrics:   Weight 144 lb 8 oz on 6/17 decreased from 151 kb on 5/3   NUTRITION DIAGNOSIS: Inadequate oral intake continues   INTERVENTION:  Will provide patient with case of ensure enlive to pick up at visit on 7/1 Encouraged patient to eat lunch meat and cheese if run out of bread instead of not eating at all.   Discussed other food items to keep on hand    MONITORING, EVALUATION, GOAL: weight trends, intake   NEXT VISIT: August 5, phone f/u  Aianna Fahs B. Zenia Resides, [place], [place] Registered Dietitian [phone] (pager)

## 2020-03-26 ENCOUNTER — Ambulatory Visit
Admission: RE | Admit: 2020-03-26 | Discharge: 2020-03-26 | Disposition: A | Discharge status: Home / Self Care | Payer: Medicare HMO | Source: Ambulatory Visit | Attending: Internal Medicine | Admitting: Internal Medicine

## 2020-03-26 ENCOUNTER — Other Ambulatory Visit: Payer: Self-pay

## 2020-03-26 DIAGNOSIS — C50919 Malignant neoplasm of unspecified site of unspecified female breast: Secondary | ICD-10-CM | POA: Diagnosis not present

## 2020-03-26 DIAGNOSIS — I700 Atherosclerosis of aorta: Secondary | ICD-10-CM | POA: Diagnosis not present

## 2020-03-26 DIAGNOSIS — C50412 Malignant neoplasm of upper-outer quadrant of left female breast: Secondary | ICD-10-CM

## 2020-03-26 DIAGNOSIS — J432 Centrilobular emphysema: Secondary | ICD-10-CM | POA: Diagnosis not present

## 2020-03-26 DIAGNOSIS — Z170 Estrogen receptor positive status [ER+]: Secondary | ICD-10-CM | POA: Diagnosis not present

## 2020-03-26 MED ORDER — IOHEXOL 300 MG/ML  SOLN
100.0000 mL | Freq: Once | INTRAMUSCULAR | Status: AC | PRN
  Administered 2020-03-26: 100 mL via INTRAVENOUS

## 2020-03-27 ENCOUNTER — Telehealth: Payer: Self-pay | Admitting: Internal Medicine

## 2020-03-27 NOTE — Telephone Encounter (Signed)
On 6/29-spoke to patient regarding improvement noted on the CT scan of current chemotherapy.  Follow-up as planned

## 2020-03-28 ENCOUNTER — Other Ambulatory Visit: Payer: Self-pay

## 2020-03-28 ENCOUNTER — Inpatient Hospital Stay (HOSPITAL_BASED_OUTPATIENT_CLINIC_OR_DEPARTMENT_OTHER): Payer: Medicare HMO | Admitting: Internal Medicine

## 2020-03-28 ENCOUNTER — Encounter: Payer: Self-pay | Admitting: Internal Medicine

## 2020-03-28 ENCOUNTER — Inpatient Hospital Stay: Payer: Medicare HMO

## 2020-03-28 ENCOUNTER — Inpatient Hospital Stay: Payer: Medicare HMO | Attending: Internal Medicine

## 2020-03-28 ENCOUNTER — Other Ambulatory Visit: Payer: Self-pay | Admitting: Cardiovascular Disease

## 2020-03-28 DIAGNOSIS — R634 Abnormal weight loss: Secondary | ICD-10-CM | POA: Insufficient documentation

## 2020-03-28 DIAGNOSIS — Z7189 Other specified counseling: Secondary | ICD-10-CM

## 2020-03-28 DIAGNOSIS — Z515 Encounter for palliative care: Secondary | ICD-10-CM | POA: Insufficient documentation

## 2020-03-28 DIAGNOSIS — C7931 Secondary malignant neoplasm of brain: Secondary | ICD-10-CM | POA: Insufficient documentation

## 2020-03-28 DIAGNOSIS — Z5189 Encounter for other specified aftercare: Secondary | ICD-10-CM | POA: Insufficient documentation

## 2020-03-28 DIAGNOSIS — E785 Hyperlipidemia, unspecified: Secondary | ICD-10-CM | POA: Insufficient documentation

## 2020-03-28 DIAGNOSIS — C50412 Malignant neoplasm of upper-outer quadrant of left female breast: Secondary | ICD-10-CM | POA: Diagnosis not present

## 2020-03-28 DIAGNOSIS — H539 Unspecified visual disturbance: Secondary | ICD-10-CM | POA: Diagnosis not present

## 2020-03-28 DIAGNOSIS — G620 Drug-induced polyneuropathy: Secondary | ICD-10-CM | POA: Insufficient documentation

## 2020-03-28 DIAGNOSIS — C787 Secondary malignant neoplasm of liver and intrahepatic bile duct: Secondary | ICD-10-CM | POA: Diagnosis not present

## 2020-03-28 DIAGNOSIS — Z79818 Long term (current) use of other agents affecting estrogen receptors and estrogen levels: Secondary | ICD-10-CM | POA: Diagnosis not present

## 2020-03-28 DIAGNOSIS — Z7901 Long term (current) use of anticoagulants: Secondary | ICD-10-CM | POA: Diagnosis not present

## 2020-03-28 DIAGNOSIS — Z86711 Personal history of pulmonary embolism: Secondary | ICD-10-CM | POA: Insufficient documentation

## 2020-03-28 DIAGNOSIS — C7800 Secondary malignant neoplasm of unspecified lung: Secondary | ICD-10-CM | POA: Diagnosis not present

## 2020-03-28 DIAGNOSIS — Z170 Estrogen receptor positive status [ER+]: Secondary | ICD-10-CM | POA: Insufficient documentation

## 2020-03-28 DIAGNOSIS — R5381 Other malaise: Secondary | ICD-10-CM | POA: Diagnosis not present

## 2020-03-28 DIAGNOSIS — Z79899 Other long term (current) drug therapy: Secondary | ICD-10-CM | POA: Diagnosis not present

## 2020-03-28 DIAGNOSIS — R2689 Other abnormalities of gait and mobility: Secondary | ICD-10-CM | POA: Diagnosis not present

## 2020-03-28 DIAGNOSIS — R7989 Other specified abnormal findings of blood chemistry: Secondary | ICD-10-CM | POA: Diagnosis not present

## 2020-03-28 DIAGNOSIS — I10 Essential (primary) hypertension: Secondary | ICD-10-CM | POA: Insufficient documentation

## 2020-03-28 DIAGNOSIS — F17210 Nicotine dependence, cigarettes, uncomplicated: Secondary | ICD-10-CM | POA: Diagnosis not present

## 2020-03-28 DIAGNOSIS — R5383 Other fatigue: Secondary | ICD-10-CM | POA: Insufficient documentation

## 2020-03-28 DIAGNOSIS — Z5111 Encounter for antineoplastic chemotherapy: Secondary | ICD-10-CM | POA: Insufficient documentation

## 2020-03-28 DIAGNOSIS — T451X5A Adverse effect of antineoplastic and immunosuppressive drugs, initial encounter: Secondary | ICD-10-CM | POA: Insufficient documentation

## 2020-03-28 DIAGNOSIS — Z86718 Personal history of other venous thrombosis and embolism: Secondary | ICD-10-CM | POA: Diagnosis not present

## 2020-03-28 LAB — CBC WITH DIFFERENTIAL/PLATELET
Abs Immature Granulocytes: 0.02 10*3/uL (ref 0.00–0.07)
Basophils Absolute: 0 10*3/uL (ref 0.0–0.1)
Basophils Relative: 0 %
Eosinophils Absolute: 0.1 10*3/uL (ref 0.0–0.5)
Eosinophils Relative: 2 %
HCT: 38.4 % (ref 36.0–46.0)
Hemoglobin: 13 g/dL (ref 12.0–15.0)
Immature Granulocytes: 0 %
Lymphocytes Relative: 13 %
Lymphs Abs: 0.7 10*3/uL (ref 0.7–4.0)
MCH: 32.6 pg (ref 26.0–34.0)
MCHC: 33.9 g/dL (ref 30.0–36.0)
MCV: 96.2 fL (ref 80.0–100.0)
Monocytes Absolute: 0.5 10*3/uL (ref 0.1–1.0)
Monocytes Relative: 10 %
Neutro Abs: 3.8 10*3/uL (ref 1.7–7.7)
Neutrophils Relative %: 75 %
Platelets: 126 10*3/uL — ABNORMAL LOW (ref 150–400)
RBC: 3.99 MIL/uL (ref 3.87–5.11)
RDW: 18.8 % — ABNORMAL HIGH (ref 11.5–15.5)
WBC: 5.1 10*3/uL (ref 4.0–10.5)
nRBC: 0 % (ref 0.0–0.2)

## 2020-03-28 LAB — COMPREHENSIVE METABOLIC PANEL
ALT: 51 U/L — ABNORMAL HIGH (ref 0–44)
AST: 71 U/L — ABNORMAL HIGH (ref 15–41)
Albumin: 3.6 g/dL (ref 3.5–5.0)
Alkaline Phosphatase: 341 U/L — ABNORMAL HIGH (ref 38–126)
Anion gap: 9 (ref 5–15)
BUN: 10 mg/dL (ref 8–23)
CO2: 28 mmol/L (ref 22–32)
Calcium: 8.7 mg/dL — ABNORMAL LOW (ref 8.9–10.3)
Chloride: 102 mmol/L (ref 98–111)
Creatinine, Ser: 0.75 mg/dL (ref 0.44–1.00)
GFR calc Af Amer: >60 mL/min (ref >60)
GFR calc non Af Amer: >60 mL/min (ref >60)
Glucose, Bld: 83 mg/dL (ref 70–99)
Potassium: 4 mmol/L (ref 3.5–5.1)
Sodium: 139 mmol/L (ref 135–145)
Total Bilirubin: 0.9 mg/dL (ref 0.3–1.2)
Total Protein: 7.1 g/dL (ref 6.5–8.1)

## 2020-03-28 MED ORDER — SODIUM CHLORIDE 0.9 % IV SOLN
Freq: Once | INTRAVENOUS | Status: AC
  Filled 2020-03-28: qty 250

## 2020-03-28 MED ORDER — SODIUM CHLORIDE 0.9 % IV SOLN
1800.0000 mg | Freq: Once | INTRAVENOUS | Status: AC
  Administered 2020-03-28: 1800 mg via INTRAVENOUS
  Filled 2020-03-28: qty 26.3

## 2020-03-28 MED ORDER — PROCHLORPERAZINE MALEATE 10 MG PO TABS
10.0000 mg | ORAL_TABLET | Freq: Once | ORAL | Status: AC
  Administered 2020-03-28: 10 mg via ORAL
  Filled 2020-03-28: qty 1

## 2020-03-28 MED ORDER — PEGFILGRASTIM 6 MG/0.6ML ~~LOC~~ PSKT
6.0000 mg | PREFILLED_SYRINGE | Freq: Once | SUBCUTANEOUS | Status: AC
  Administered 2020-03-28: 6 mg via SUBCUTANEOUS
  Filled 2020-03-28: qty 0.6

## 2020-03-28 MED ORDER — HEPARIN SOD (PORK) LOCK FLUSH 100 UNIT/ML IV SOLN
INTRAVENOUS | Status: AC
  Filled 2020-03-28: qty 5

## 2020-03-28 MED ORDER — HEPARIN SOD (PORK) LOCK FLUSH 100 UNIT/ML IV SOLN
500.0000 [IU] | Freq: Once | INTRAVENOUS | Status: AC | PRN
  Administered 2020-03-28: 500 [IU]
  Filled 2020-03-28: qty 5

## 2020-03-28 NOTE — Progress Notes (Signed)
[facility] OFFICE PROGRESS NOTE  Patient Care Team: Delsa Grana, PA-C as PCP - General (Family Medicine) Wellington Hampshire, MD as PCP - Cardiology (Cardiology) Bary Castilla [place] Gleason, MD (General Surgery) Vladimir Crofts, MD as Consulting Physician (Neurology)  Cancer Staging No matching staging information was found for the patient.   Oncology History Overview Note  # LEFT BREAST IDC; STAGE II [cT2N1] ER >90%; PR- 50-90%; her 2 NEU- POS; s/p Neoadj chemo; AUG 2016- TCH+P s/p Lumpec & partial ALND- path CR; s/p RT [finished Nov 2016];  adj Herceptin; HELD for Jan 19th 2017 [in DEC 2016-EF dropped from 63 to 51%; FEB 27th EF-42.9%]; JAN 2016 START Arimidex; May 2017- EF- 60%; May 24th 2017-Re-start Herceptin q 3W; July 28th STOP herceptin [finished 5m/m2; sec to Low EF; however 2017 Oct EF= 67%; improved]  # DEC 4th 2017- Start Neratinib 4 pills; DEC 11th 3 pills; STOPPED.  # FEB 2018- RECURRENCE ER/PR positive; Her 2 NEGATIVE [liver Bx]  # MARCH 1st 2018- Tax-Cytoxan [poor tol]  # FEB 2018- Brain mets [SBRT; Dr.Crystal; finished Feb 28th 2018]  # March 2018- Eribulin- poor tolerance  # June 8th 2018- Started Faslodex + Abema [abema-multiple interruptions sec to neutropenia]  # MRI Brain- March 2019- Leptomeningeal mets [WBRT; No LP s/p WBRT- 01/09/2018]; STOP Abema [519mday-sec to severe cytopenia]+faslodex  # MAY 20tth 019- Start Xeloda; STOPPED in Nov 22nd 2019- sec to Progressive liver lesions on CT scan  # NOV 26th 2019- Faslodex + Piqaray 25024m; Jan 2020- Piqray 300 mg+ faslodex; FEB 17th CT scan- mixed response/ Overall progression; STOP Piqray + faslodex.   # FEB 25th 2020- ERIBULIN; Poor tolerance sec to severe neutropenia.  Stopped May 2020.  CT scan May 2020-new/progressive 2 cm liver lesion;   #July nd 2020-Ixempra x5 cycles-; NOV 2020-MRI liver- worsening liver lesions [rising AST/ALT].   # NOV [id number] weekly [consent-]; December  31-2020-progression/rising LFTs.  Stop Taxol  #January 15th, 2021-Adriamycin weekly [C]; MARCH 2021- Partial response. STOP ADRIAMYCIN sec to EF drop 40-45%.   # April 2nd-2021-gemcitabine; 01/15/2020-GEM [800 mg/m2; #3 cycle 1000m39m] q 2 W; with onpro.   #May 06, 2019-MRI brain 5 new lesions [3 mm to 10 mm]-asymptomatic; [previous whole brain radiation]- OCT 2020- Re-Irradiation.   # PN- G-2; may 2017- Cymbalta 60mg24mARCH 2018-  acute vascular insuff of BIL LE [? Taxotere vasoconstriction on asprin]; # hemorragic shock LGIB [s/p colo; Dr.Wohl]  #  SVT- on flecainide.   # April 2016- Liver Bx- NEG;   # Drop in EF from Herceptin [recovered OCt 27th- EF 65%]; March 2020 ejection fraction 40 to 45%.  Stop Adriamycin.  # BMD- jan 2017- osteopenia; #Gait instability/visual disturbance-[Dr.Shah]- S/p EEG [5/13]- negative for seizures.  # 2020- SEP-s/p Pallaitive care evaluation  -----------------------------------------------------------     MOLECULAR TESTING- [facility]- Sep 4th 2018- PDL-1 0%/NEG for BRCA; MSI-Stable; Positive for PI3K; ccnd-1; FGF** -----------------------------------------------------------  Dx: Metastatic Breast cancer [ER/PR-Pos; her -2 NEG] Stage IV; goals: palliative Current treatment-gemcitabine chemotherapy [C]   Carcinoma of upper-outer quadrant of left breast in female, estrogen receptor positive (HCC) [place]25/2020 - 01/17/2019 Chemotherapy   The patient had eriBULin mesylate (HALAVEN) 2.1 mg in sodium chloride 0.9 % 100 mL chemo infusion, 1.2 mg/m2 = 2.1 mg (100 % of original dose 1.2 mg/m2), Intravenous,  Once, 3 of 4 cycles Dose modification: 1.2 mg/m2 (original dose 1.2 mg/m2, Cycle 1, Reason: Provider Judgment), 0.8 mg/m2 (original dose 1.2 mg/m2, Cycle 3, Reason: Provider Judgment) Administration:  2.1 mg (11/22/2018), 2.1 mg (11/29/2018), 2.1 mg (12/20/2018), 1.4 mg (01/17/2019)  for chemotherapy treatment.    03/30/2019 - 07/12/2019 Chemotherapy   The  patient had pegfilgrastim (NEULASTA ONPRO KIT) injection 6 mg, 6 mg, Subcutaneous, Once, 5 of 8 cycles Administration: 6 mg (03/30/2019), 6 mg (04/20/2019), 6 mg (05/11/2019), 6 mg (06/22/2019), 6 mg (06/01/2019) ixabepilone (IXEMPRA) 60 mg in lactated ringers 250 mL chemo infusion, 33.5 mg/m2 = 57 mg (100 % of original dose 32 mg/m2), Intravenous,  Once, 5 of 8 cycles Dose modification: 32 mg/m2 (original dose 32 mg/m2, Cycle 1, Reason: Provider Judgment) Administration: 72 mg (04/20/2019), 72 mg (05/11/2019), 72 mg (06/22/2019), 72 mg (06/01/2019)  for chemotherapy treatment.    08/18/2019 - 09/28/2019 Chemotherapy   The patient had PACLitaxel (TAXOL) 150 mg in sodium chloride 0.9 % 250 mL chemo infusion (</= 28m/m2), 80 mg/m2 = 150 mg, Intravenous,  Once, 1 of 4 cycles Administration: 150 mg (08/18/2019), 150 mg (09/08/2019), 150 mg (09/15/2019)  for chemotherapy treatment.    10/13/2019 - 11/30/2019 Chemotherapy   The patient had DOXOrubicin (ADRIAMYCIN) chemo injection 38 mg, 20 mg/m2 = 38 mg, Intravenous,  Once, 6 of 9 cycles Administration: 38 mg (10/13/2019), 38 mg (10/20/2019), 38 mg (10/27/2019), 38 mg (11/17/2019), 38 mg (11/24/2019), 38 mg (11/10/2019) palonosetron (ALOXI) injection 0.25 mg, 0.25 mg, Intravenous,  Once, 6 of 9 cycles Administration: 0.25 mg (10/13/2019), 0.25 mg (10/20/2019), 0.25 mg (10/27/2019), 0.25 mg (11/17/2019), 0.25 mg (11/24/2019), 0.25 mg (11/10/2019)  for chemotherapy treatment.    12/29/2019 -  Chemotherapy   The patient had pegfilgrastim (NEULASTA ONPRO KIT) injection 6 mg, 6 mg, Subcutaneous, Once, 6 of 7 cycles Administration: 6 mg (01/15/2020), 6 mg (01/29/2020), 6 mg (02/12/2020), 6 mg (02/28/2020), 6 mg (03/14/2020), 6 mg (03/28/2020) gemcitabine (GEMZAR) 1,444 mg in sodium chloride 0.9 % 250 mL chemo infusion, 800 mg/m2 = 1,444 mg, Intravenous,  Once, 7 of 8 cycles Dose modification: 1,000 mg/m2 (original dose 800 mg/m2, Cycle 3, Reason: Provider Judgment) Administration: 1,444 mg  (12/29/2019), 1,444 mg (01/15/2020), 1,800 mg (01/29/2020), 1,800 mg (02/12/2020), 1,800 mg (02/28/2020), 1,800 mg (03/14/2020), 1,800 mg (03/28/2020)  for chemotherapy treatment.      INTERVAL HISTORY:  Sarah [id number].  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  Appetite is good.  No nausea no vomiting.  No headaches.  Complains of difficulty hearing bilaterally-left more than right.  Unable to see PCP because of transportation issues.  Review of Systems  Constitutional: Positive for malaise/fatigue and weight loss. Negative for chills, diaphoresis and fever.  HENT: Negative for nosebleeds and sore throat.   Eyes: Negative for double vision.  Respiratory: Negative for cough, hemoptysis, sputum production, shortness of breath and wheezing.   Cardiovascular: Negative for chest pain, palpitations, orthopnea and leg swelling.  Gastrointestinal: Positive for constipation. Negative for abdominal pain, blood in stool, heartburn, melena, nausea and vomiting.  Genitourinary: Negative for dysuria, frequency and urgency.  Musculoskeletal: Positive for joint pain. Negative for back pain.  Skin: Negative for itching.  Neurological: Positive for dizziness and tingling. Negative for focal weakness, weakness and headaches.  Endo/Heme/Allergies: Does not bruise/bleed easily.  Psychiatric/Behavioral: Negative for depression. The patient is not nervous/anxious and does not have insomnia.     PAST MEDICAL HISTORY :  Past Medical History:  Diagnosis Date  . Chemotherapy-induced peripheral neuropathy ([place]   .  Epistaxis    a. 11/2016 in setting of asa/plavix-->silver nitrate cauterization.  . GI bleed    a. 11/2016 Admission w/ GIB and hypovolemic shock req 3u PRBC's;  b. 11/2016 ECG: gastritis & nonbleeding peptic ulcer; c. 11/2016 Conlonoscopy:  rectal and sigmoid colonic ulcers.  . Heart attack ([place])    a. 1998 Cath @ [facility]: reportedly no intervention required.  Marland Kitchen Herceptin-induced cardiomyopathy ([place])    a. In the setting of Herceptin Rx for breast cancer (initiated 12/2014); b. 03/2015 MUGA EF 64%; b. 08/2015 MUGA: EF 51%; c. 10/2015 MUGA: EF 44%; d. 11/2015 Echo: EF 45-50%; e. 01/2016 MUGA: EF 60%; f. 06/2016 MUGA EF 65%; g. 10/2016 MUGA: EF 61%;  h. 12/2016 Echo: EF 55-60%, gr1 DD.  Marland Kitchen Hyperlipidemia   . Hypertension   . Neuropathy   . Possible PAD (peripheral artery disease) ([place])    a. 11/2016 LE cyanosis and weak pulses-->CTA w/o significant Ao-BiFem dzs. ? distal dzs-->ASA/Plavix initiated by vascular surgery.  Marland Kitchen PSVT (paroxysmal supraventricular tachycardia) ([place])    a. Dx 11/2016.  . Pulmonary embolism ([place])    a. 12/2016 CTA Chest: small nonocclusive PE in inferior segment of the Left lingula, somewhat eccentric filling defect suggesting chronic rather than acute embolic event; b. 05/7352 LE U/S:  No DVT; c. 12/2016 Echo: Nl RV fxn, nl PASP.  Marland Kitchen Recurrent Metastatic breast cancer ([place])    a. Dx 2016: Stage II, ER positive, PR positive, HER-2/neu overexpressing of the left breast-->chemo/radiation; b. 10/2016 CT Abd/pelvis: diffuse liver mets, ill defined sclerotic bone lesions-T12;  c. 10/2016 MRI brain: metastatic lesion along L temporal lobe (19x19m) w/ extensive surrounding edema & 582mmidline shift to right.  . Sepsis ([place]  . Sinus tachycardia     PAST SURGICAL HISTORY :   Past Surgical History:  Procedure Laterality Date  . BREAST BIOPSY Left 2016   Positive  . BREAST LUMPECTOMY WITH SENTINEL LYMPH NODE BIOPSY Left 05/23/2015   Procedure: LEFT BREAST WIDE EXCISION WITH AXILLARY DISSECTION, MASTOPLASTY ;  Surgeon: JeRobert BellowMD;  Location: [facility] ORS;  Service: General;  Laterality: Left;  . BREAST SURGERY Left 12/18/14   breast biopsy/INVASIVE DUCTAL CARCINOMA OF BREAST, NOTTINGHAM GRADE 2.  . Marland KitchenREAST SURGERY  05/23/2015.    Wide excision/mastoplasty, axillary dissection. No residual invasive cancer, positive for residual DCIS. 0/2 nodes identified on axillary dissection. (no SLN by technetium or methylene blue)  . CARDIAC CATHETERIZATION    . COLONOSCOPY WITH PROPOFOL N/A 12/10/2016   Procedure: COLONOSCOPY WITH PROPOFOL;  Surgeon: DaLucilla LameMD;  Location: [facility] ENDOSCOPY;  Service: Endoscopy;  Laterality: N/A;  . COLONOSCOPY WITH PROPOFOL N/A 02/13/2017   Procedure: COLONOSCOPY WITH PROPOFOL;  Surgeon: WoLucilla LameMD;  [facility];  Service: Endoscopy;  Laterality: N/A;  . ESOPHAGOGASTRODUODENOSCOPY N/A 11/03/2019   Procedure: ESOPHAGOGASTRODUODENOSCOPY (EGD);  Surgeon: VaLin LandsmanMD;  [facility];  Service: Gastroenterology;  Laterality: N/A;  . ESOPHAGOGASTRODUODENOSCOPY (EGD) WITH PROPOFOL N/A 12/08/2016   Procedure: ESOPHAGOGASTRODUODENOSCOPY (EGD) WITH PROPOFOL;  Surgeon: DaLucilla LameMD;  Location: [facility];  Service: Endoscopy;  Laterality: N/A;  . IVC FILTER INSERTION N/A 02/15/2017   Procedure: IVC Filter Insertion;  Surgeon: DeAlgernon HuxleyMD;  [facility];  Service: Cardiovascular;  Laterality: N/A;  . PORTACATH PLACEMENT Right 12-31-14   Dr ByBary Castilla  FAMILY HISTORY :   Family History  Problem Relation Age of Onset  . Breast cancer Maternal Aunt   . Breast cancer  Cousin   . Brain cancer Maternal Uncle   . Diabetes Mother   . Hypertension Mother   . Stroke Mother     SOCIAL HISTORY:   Social History   Tobacco Use  . Smoking status: Light Tobacco Smoker    Packs/day: 0.25    Years: 18.00    Pack years: 4.50    Types: Cigarettes  . Smokeless tobacco: Never Used  Vaping Use  . Vaping Use: Never used  Substance Use Topics  . Alcohol use: No    Alcohol/week: 0.0 standard drinks  . Drug use: No    ALLERGIES:  is allergic to no known allergies.  MEDICATIONS:  Current Outpatient Medications  Medication Sig Dispense Refill  .  acetaminophen (TYLENOL) 650 MG CR tablet Take 650 mg by mouth every 8 (eight) hours as needed for pain.    Marland Kitchen apixaban (ELIQUIS) 5 MG TABS tablet Take 1 tablet (5 mg total) by mouth 2 (two) times daily. 60 tablet 4  . flecainide (TAMBOCOR) 50 MG tablet Take 1 tablet (50 mg total) by mouth 2 (two) times daily. 60 tablet 4  . magnesium oxide (MAG-OX) 400 MG tablet TAKE 1 TABLET BY MOUTH TWICE A DAY 60 tablet 4  . meclizine (ANTIVERT) 25 MG tablet Take 1 tablet (25 mg total) by mouth 3 (three) times daily as needed for dizziness. 30 tablet 0  . metoprolol tartrate (LOPRESSOR) 25 MG tablet TAKE 1 TABLET BY MOUTH TWICE A DAY 60 tablet 1  . montelukast (SINGULAIR) 10 MG tablet Take 1 tablet (10 mg total) by mouth at bedtime. 60 tablet 3  . ondansetron (ZOFRAN) 8 MG tablet One pill every 8 hours as needed for nausea/vomitting. 40 tablet 1  . pregabalin (LYRICA) 100 MG capsule TAKE 1 CAPSULE BY MOUTH TWICE A DAY 60 capsule 2  . prochlorperazine (COMPAZINE) 10 MG tablet Take 1 tablet (10 mg total) by mouth every 6 (six) hours as needed for nausea or vomiting. 40 tablet 1  . Vitamin D, Ergocalciferol, (DRISDOL) 1.25 MG (50000 UNIT) CAPS capsule TAKE 1 CAPSULE (50,000 UNITS TOTAL) ONCE A WEEK BY MOUTH. 12 capsule 1  . ipratropium (ATROVENT) 0.03 % nasal spray Place 2 sprays into both nostrils every 12 (twelve) hours. (Patient not taking: Reported on 03/07/2020) 30 mL 12  . levocetirizine (XYZAL) 5 MG tablet TAKE 1 TABLET BY MOUTH EVERY DAY IN THE EVENING (Patient not taking: Reported on 03/14/2020) 90 tablet 1   No current facility-administered medications for this visit.   Facility-Administered Medications Ordered in Other Visits  Medication Dose Route Frequency Provider Last Rate Last Admin  . 0.9 %  sodium chloride infusion   Intravenous Continuous [name], MD   New Bag at 12/10/16 1140  . 0.9 %  sodium chloride infusion   Intravenous Continuous Lloyd Huger, MD 999 mL/hr at 04/24/15 1520 New  Bag at 05/23/15 0951  . 0.9 %  sodium chloride infusion   Intravenous Continuous Verlon Au, NP 999 mL/hr at 10/31/19 1522 New Bag at 10/31/19 1522  . heparin lock flush 100 unit/mL  500 Units Intravenous Once Berenzon, Dmitriy, MD      . sodium chloride 0.9 % injection 10 mL  10 mL Intravenous PRN [name], MD   10 mL at 04/04/15 1440  . sodium chloride flush (NS) 0.9 % injection 10 mL  10 mL Intravenous PRN Berenzon, Dmitriy, MD        PHYSICAL EXAMINATION: ECOG PERFORMANCE STATUS: 1 - Symptomatic but  completely ambulatory  BP (!) 135/92 (BP Location: Right Arm, Patient Position: Sitting, Cuff Size: Normal)   Pulse 86   Temp (!) 97.2 F (36.2 C) (Tympanic)   Resp 16   Ht _0  (1.676 m)   Wt 145 lb (65.8 kg)   SpO2 100%   BMI 23.40 kg/m   Filed Weights   03/28/20 0922  Weight: 145 lb (65.8 kg)    Physical Exam Constitutional:      Comments: Patient is alone.  In wheel chair.   HENT:     Head: Normocephalic and atraumatic.     Mouth/Throat:     Pharynx: No oropharyngeal exudate.  Eyes:     Pupils: Pupils are equal, round, and reactive to light.  Cardiovascular:     Rate and Rhythm: Normal rate and regular rhythm.  Pulmonary:     Effort: Pulmonary effort is normal. No respiratory distress.     Breath sounds: Normal breath sounds. No wheezing.  Abdominal:     General: Bowel sounds are normal. There is no distension.     Palpations: Abdomen is soft. There is no mass.     Tenderness: There is no abdominal tenderness. There is no guarding or rebound.  Musculoskeletal:        General: No tenderness. Normal range of motion.     Cervical back: Normal range of motion and neck supple.  Skin:    General: Skin is warm.  Neurological:     Mental Status: She is alert and oriented to person, place, and time.     Comments: Broad based gait.  Unsteady.  Psychiatric:        Mood and Affect: Affect normal.     LABORATORY DATA:  I have reviewed the data as listed     Component Value Date/Time   NA 139 03/28/2020 0907   NA 135 03/04/2017 0918   NA 135 01/08/2015 0850   K 4.0 03/28/2020 0907   K 3.7 01/08/2015 0850   CL 102 03/28/2020 0907   CL 101 01/08/2015 0850   CO2 28 03/28/2020 0907   CO2 26 01/08/2015 0850   GLUCOSE 83 03/28/2020 0907   GLUCOSE 161 (H) 01/08/2015 0850   BUN 10 03/28/2020 0907   BUN 8 03/04/2017 0918   BUN 17 01/08/2015 0850   CREATININE 0.75 03/28/2020 0907   CREATININE 0.82 01/22/2015 1559   CALCIUM 8.7 (L) 03/28/2020 0907   CALCIUM 9.3 01/08/2015 0850   PROT 7.1 03/28/2020 0907   PROT 6.4 03/04/2017 0918   PROT 6.8 01/22/2015 1559   ALBUMIN 3.6 03/28/2020 0907   ALBUMIN 2.5 (L) 03/04/2017 0918   ALBUMIN 3.9 01/22/2015 1559   AST 71 (H) 03/28/2020 0907   AST 34 01/22/2015 1559   ALT 51 (H) 03/28/2020 0907   ALT 42 01/22/2015 1559   ALKPHOS 341 (H) 03/28/2020 0907   ALKPHOS 157 (H) 01/22/2015 1559   BILITOT 0.9 03/28/2020 0907   BILITOT 6.2 (H) 03/04/2017 0918   BILITOT 0.2 (L) 01/22/2015 1559   GFRNONAA >60 03/28/2020 0907   GFRNONAA >60 01/22/2015 1559   GFRAA >60 03/28/2020 0907   GFRAA >60 01/22/2015 1559    No results found for: SPEP, UPEP  Lab Results  Component Value Date   WBC 5.1 03/28/2020   NEUTROABS 3.8 03/28/2020   HGB 13.0 03/28/2020   HCT 38.4 03/28/2020   MCV 96.2 03/28/2020   PLT 126 (L) 03/28/2020      Chemistry  Component Value Date/Time   NA 139 03/28/2020 0907   NA 135 03/04/2017 0918   NA 135 01/08/2015 0850   K 4.0 03/28/2020 0907   K 3.7 01/08/2015 0850   CL 102 03/28/2020 0907   CL 101 01/08/2015 0850   CO2 28 03/28/2020 0907   CO2 26 01/08/2015 0850   BUN 10 03/28/2020 0907   BUN 8 03/04/2017 0918   BUN 17 01/08/2015 0850   CREATININE 0.75 03/28/2020 0907   CREATININE 0.82 01/22/2015 1559      Component Value Date/Time   CALCIUM 8.7 (L) 03/28/2020 0907   CALCIUM 9.3 01/08/2015 0850   ALKPHOS 341 (H) 03/28/2020 0907   ALKPHOS 157 (H) 01/22/2015 1559    AST 71 (H) 03/28/2020 0907   AST 34 01/22/2015 1559   ALT 51 (H) 03/28/2020 0907   ALT 42 01/22/2015 1559   BILITOT 0.9 03/28/2020 0907   BILITOT 6.2 (H) 03/04/2017 0918   BILITOT 0.2 (L) 01/22/2015 1559       RADIOGRAPHIC STUDIES: I have personally reviewed the radiological images as listed and agreed with the findings in the report. No results found.   ASSESSMENT & PLAN:  Carcinoma of upper-outer quadrant of left breast in female, estrogen receptor positive ([place]) # Recurrent metastatic breast cancer-lung/liver metastases ER PR positive HER-2 negative; on Gemcitabine; June 29th CT improvement of LUL ~2.9 cm; Liver lesions- smaller.   # proceed with gemcitabine #7 every 2 weeks[sec to neutropenia/on pro].  Labs today reviewed;  acceptable for treatment today.   #Gait instability/visual disturbance-[Dr.Shah]-clinically- STABLE; S/p EEG [5/13]- negative for seizures.  # cardiomyopathy/ Sinus tachycardia- on metoprolol-/Tamabcor; [march 2d-echo- 40-45%]; STABLE>   # Elevated LFTs-likely secondary underlying cirrhosis/ chemo- monitor for now.  Stable  # Peripheral neuropathy grade 1-2-  on Lyrica; stable  # weight loss/ poor appetite; sec to malignancy/ chemo-stable  # Right lower extremity DVT bilateral-November 28, 2019-STABLE; CT scan abdomen pelvis negative for any DVT.  On eliquis.    # DISPOSITION:  # gemcitabine/onprpo today;  # 2 weeks MD; labs- cbc/cmp;GEM- onpro; -Dr.B  # I reviewed the blood work- with the patient in detail; also reviewed the imaging independently [as summarized above]; and with the patient in detail.       No orders of the defined types were placed in this encounter.  All questions were answered. The patient knows to call the clinic with any problems, questions or concerns.     Cammie Sickle, MD 03/31/2020 8:27 AM

## 2020-03-28 NOTE — Assessment & Plan Note (Addendum)
#  Recurrent metastatic breast cancer-lung/liver metastases ER PR positive HER-2 negative; on Gemcitabine; June 29th CT improvement of LUL ~2.9 cm; Liver lesions- smaller.   # proceed with gemcitabine #7 every 2 weeks[sec to neutropenia/on pro].  Labs today reviewed;  acceptable for treatment today.   #Gait instability/visual disturbance-[Dr.Shah]-clinically- STABLE; S/p EEG [5/13]- negative for seizures.  # cardiomyopathy/ Sinus tachycardia- on metoprolol-/Tamabcor; [march 2d-echo- 40-45%]; STABLE>   # Elevated LFTs-likely secondary underlying cirrhosis/ chemo- monitor for now.  Stable  # Peripheral neuropathy grade 1-2-  on Lyrica; stable  # weight loss/ poor appetite; sec to malignancy/ chemo-stable  # Right lower extremity DVT bilateral-November 28, 2019-STABLE; CT scan abdomen pelvis negative for any DVT.  On eliquis.    # DISPOSITION:  # gemcitabine/onprpo today;  # 2 weeks MD; labs- cbc/cmp;GEM- onpro; -Dr.B  # I reviewed the blood work- with the patient in detail; also reviewed the imaging independently [as summarized above]; and with the patient in detail.

## 2020-04-02 ENCOUNTER — Other Ambulatory Visit: Payer: Self-pay

## 2020-04-02 ENCOUNTER — Ambulatory Visit (INDEPENDENT_AMBULATORY_CARE_PROVIDER_SITE_OTHER): Payer: Medicare HMO | Admitting: Family Medicine

## 2020-04-02 ENCOUNTER — Encounter: Payer: Self-pay | Admitting: Family Medicine

## 2020-04-02 VITALS — BP 112/76 | HR 96 | Temp 97.1°F | Resp 16 | Ht 66.0 in | Wt 145.6 lb

## 2020-04-02 DIAGNOSIS — H6123 Impacted cerumen, bilateral: Secondary | ICD-10-CM | POA: Diagnosis not present

## 2020-04-02 DIAGNOSIS — R29898 Other symptoms and signs involving the musculoskeletal system: Secondary | ICD-10-CM

## 2020-04-02 DIAGNOSIS — G620 Drug-induced polyneuropathy: Secondary | ICD-10-CM | POA: Diagnosis not present

## 2020-04-02 DIAGNOSIS — C7931 Secondary malignant neoplasm of brain: Secondary | ICD-10-CM | POA: Diagnosis not present

## 2020-04-02 DIAGNOSIS — Z7189 Other specified counseling: Secondary | ICD-10-CM

## 2020-04-02 DIAGNOSIS — I429 Cardiomyopathy, unspecified: Secondary | ICD-10-CM

## 2020-04-02 DIAGNOSIS — C50412 Malignant neoplasm of upper-outer quadrant of left female breast: Secondary | ICD-10-CM

## 2020-04-02 DIAGNOSIS — Z170 Estrogen receptor positive status [ER+]: Secondary | ICD-10-CM

## 2020-04-02 DIAGNOSIS — Z66 Do not resuscitate: Secondary | ICD-10-CM

## 2020-04-02 DIAGNOSIS — Z9181 History of falling: Secondary | ICD-10-CM | POA: Diagnosis not present

## 2020-04-02 DIAGNOSIS — T451X5A Adverse effect of antineoplastic and immunosuppressive drugs, initial encounter: Secondary | ICD-10-CM

## 2020-04-02 NOTE — Patient Instructions (Addendum)
Debrox softening ear drops - use in right ear 1 to 2 x a day for 3-5 days right before you are able to get another appointment here to irrigate ears again.   Earwax Buildup, Adult The ears produce a substance called earwax that helps keep bacteria out of the ear and protects the skin in the ear canal. Occasionally, earwax can build up in the ear and cause discomfort or hearing loss. What increases the risk? This condition is more likely to develop in people who:  Are female.  Are elderly.  Naturally produce more earwax.  Clean their ears often with cotton swabs.  Use earplugs often.  Use in-ear headphones often.  Wear hearing aids.  Have narrow ear canals.  Have earwax that is overly thick or sticky.  Have eczema.  Are dehydrated.  Have excess hair in the ear canal. What are the signs or symptoms? Symptoms of this condition include:  Reduced or muffled hearing.  A feeling of fullness in the ear or feeling that the ear is plugged.  Fluid coming from the ear.  Ear pain.  Ear itch.  Ringing in the ear.  Coughing.  An obvious piece of earwax that can be seen inside the ear canal. How is this diagnosed? This condition may be diagnosed based on:  Your symptoms.  Your medical history.  An ear exam. During the exam, your health care provider will look into your ear with an instrument called an otoscope. You may have tests, including a hearing test. How is this treated? This condition may be treated by:  Using ear drops to soften the earwax.  Having the earwax removed by a health care provider. The health care provider may: ? Flush the ear with water. ? Use an instrument that has a loop on the end (curette). ? Use a suction device.  Surgery to remove the wax buildup. This may be done in severe cases. Follow these instructions at home:   Take over-the-counter and prescription medicines only as told by your health care provider.  Do not put any objects,  including cotton swabs, into your ear. You can clean the opening of your ear canal with a washcloth or facial tissue.  Follow instructions from your health care provider about cleaning your ears. Do not over-clean your ears.  Drink enough fluid to keep your urine clear or pale yellow. This will help to thin the earwax.  Keep all follow-up visits as told by your health care provider. If earwax builds up in your ears often or if you use hearing aids, consider seeing your health care provider for routine, preventive ear cleanings. Ask your health care provider how often you should schedule your cleanings.  If you have hearing aids, clean them according to instructions from the manufacturer and your health care provider. Contact a health care provider if:  You have ear pain.  You develop a fever.  You have blood, pus, or other fluid coming from your ear.  You have hearing loss.  You have ringing in your ears that does not go away.  Your symptoms do not improve with treatment.  You feel like the room is spinning (vertigo). Summary  Earwax can build up in the ear and cause discomfort or hearing loss.  The most common symptoms of this condition include reduced or muffled hearing and a feeling of fullness in the ear or feeling that the ear is plugged.  This condition may be diagnosed based on your symptoms, your medical history,  and an ear exam.  This condition may be treated by using ear drops to soften the earwax or by having the earwax removed by a health care provider.  Do not put any objects, including cotton swabs, into your ear. You can clean the opening of your ear canal with a washcloth or facial tissue. This information is not intended to replace advice given to you by your health care provider. Make sure you discuss any questions you have with your health care provider. Document Revised: 08/27/2017 Document Reviewed: 11/25/2016 Elsevier Patient Education  2020 [employer].

## 2020-04-02 NOTE — Progress Notes (Signed)
Patient ID: Sarah Horne, female    DOB: [birth year], 64 y.o.   MRN: [id number]  PCP: Delsa Grana, PA-C  Chief Complaint  Patient presents with  . Ear Fullness    right ear worse then left    Subjective:   Sarah Horne is a 64 y.o. female, presents to clinic with CC of the following:  HPI  Here for cerumen impaction and ear irrigation - hearing is a little decreased on the left, cerumen impaction bilaterally with dry wax, hx of the same.  She was told by her oncologist to come here today to get ears cleaned out  She has not been here since January 2021 when she presented for dysphagia - pt was new to me then, no other routine office visits with PCP - she sees oncology frequently and charts are copied and reviewed by me frequently.  She is DNR, working with palliative care    Oncology- Dr. Rogue Bussing   Cardiology - Dr. Fletcher Anon   Neuro - Dr. Manuella Ghazi  Working with palliative care  Only med that we have managed recently or sent refills in for was lyrica 100 mg BID for drug induced peripheral neuropathy.  Has had for may years since her first cancer tx.  It doesn't get rid of much of the pain.     Reviewed recent labs: Thrombocytopenis - platelets 126, increased RDW, elevated LFTs AST/ALT 71/51, hypocalcemia 8.7, elevated alk phos 341  CT abd/pelvis and chest w/ contrast for restaging: IMPRESSION: 1. Mild reduction in size of the left apical cavitary lesion, with continued nodularity along the wall of this lesion and plugging in the airways extending to this lesion. Mild interstitial accentuation just below this lesion could be inflammatory or from lymphangitic spread. 2. The majority of the hypodense liver lesions are reduced in size compared to prior. No new or enlarging liver lesions. 3. Other imaging findings of potential clinical significance: Coronary atherosclerosis with mild cardiomegaly. Calcified uterine fibroids. Suspected left foraminal stenosis at L5-S1 due  to facet arthropathy and disc bulge. Stable sub solid 4 mm right upper lobe nodule just above the minor fissure. Stable postoperative findings in the left breast. Nodular liver compatible with cirrhosis and/or pseudo cirrhosis. 4. Emphysema and aortic atherosclerosis.  Aortic Atherosclerosis (ICD10-I70.0) and Emphysema (ICD10-J43.9).  Last Oncology appt 03/28/2020: metastatic breast cancer ER PR positive/leptomeningeal disease metastatic to brain-currently on gemcitabine  Dx: Metastatic Breast cancer [ER/PR-Pos; her -2 NEG] Stage IV; goals: palliative Current treatment-gemcitabine chemotherapy [C]  Hx of drug induced cardiomyopathy - last CT scan shows mild cardiomegaly  Last ECHO 11/29/2019: LVEF 40-45%, LV global hypokinesis  10/2019 DVT- on eliquis and has IVC filter Last CT scans showed  Per palliative care: ACP   -MOST form previously completed.  Patient is a DNR: We discussed CODE STATUS.  Patient says that she would not want to be resuscitated or have her life prolonged artificially machines.  She tells me that her mother was on a ventilator and she would not want that for herself.  I completed a MOST form today. The patient outlined their wishes for the following treatment decisions:  Cardiopulmonary Resuscitation: Do Not Attempt Resuscitation (DNR/No CPR)  Medical Interventions: Limited Additional Interventions: Use medical treatment, IV fluids and cardiac monitoring as indicated, DO NOT USE intubation or mechanical ventilation. May consider use of less invasive airway support such as BiPAP or CPAP. Also provide comfort measures. Transfer to the hospital if indicated. Avoid intensive care.   Antibiotics: Antibiotics  if indicated  IV Fluids: IV fluids if indicated  Feeding Tube: No feeding tube    She is working with [facility]  HH/NH/hospice, doing PT to help with strength, gait, balance and ability to get up off ground if she happens to fall- she does have right LE  weakness   Patient Active Problem List   Diagnosis Date Noted  . Chemotherapy-induced fatigue 11/02/2019  . Convalescence following chemotherapy 11/02/2019  . Leukopenia due to antineoplastic chemotherapy ([place]) 11/02/2019  . Elevated serum creatinine 11/02/2019  . Acute deep vein thrombosis (DVT) of femoral vein of right lower extremity ([place]) 11/01/2019  . Accidental fall from chair, initial encounter 10/05/2019  . Metastatic breast cancer ([place]) 10/05/2019  . Sepsis ([place]) 10/05/2019  . Tachyarrhythmia 10/05/2019  . Combined forms of age-related cataract of both eyes 12/06/2018  . Nuclear sclerotic cataract of left eye 12/06/2018  . Brain metastasis ([place]) 05/19/2017  . Blood in stool   . Polyp of sigmoid colon   . Benign neoplasm of transverse colon   . Benign neoplasm of ascending colon   . Lower GI bleed   . Weakness of right lower extremity 02/11/2017  . Orthostatic lightheadedness 01/14/2017  . Atrial tachycardia ([place]) 01/14/2017  . Hypokalemia 01/14/2017  . Pulmonary emboli ([place]) 01/12/2017  . Dehydration 12/25/2016  . Blood loss anemia 12/15/2016  . Ulceration of colon   . Heartburn   . Duodenitis   . Gastritis without bleeding   . GI bleed   . Acute esophagitis   . Acute esophagogastric ulcer   . Rectal bleed   . Hypovolemic shock ([place])   . Ischemic leg 12/04/2016  . Chemotherapy induced neutropenia ([place]) 11/20/2016  . Palliative care by specialist   . Goals of care, counseling/discussion   . DNR (do not resuscitate) discussion   . Cerebral edema ([place]) 11/10/2016  . Encounter for monitoring cardiotoxic drug therapy 11/09/2016  . Elevated LFTs 10/20/2016  . Peripheral neuropathy due to chemotherapy ([place]) 03/13/2016  . Encounter for antineoplastic chemotherapy 03/13/2016  . HLD (hyperlipidemia) 01/29/2016  . Hyperglycemia, unspecified 01/29/2016  . Cardiomyopathy ([place]) 01/13/2016  . Hypertension 07/18/2015  . Carcinoma of upper-outer quadrant of left breast in  female, estrogen receptor positive ([place]) 12/27/2014      Current Outpatient Medications:  .  acetaminophen (TYLENOL) 650 MG CR tablet, Take 650 mg by mouth every 8 (eight) hours as needed for pain., Disp: , Rfl:  .  apixaban (ELIQUIS) 5 MG TABS tablet, Take 1 tablet (5 mg total) by mouth 2 (two) times daily., Disp: 60 tablet, Rfl: 4 .  flecainide (TAMBOCOR) 50 MG tablet, Take 1 tablet (50 mg total) by mouth 2 (two) times daily., Disp: 60 tablet, Rfl: 4 .  magnesium oxide (MAG-OX) 400 MG tablet, TAKE 1 TABLET BY MOUTH TWICE A DAY, Disp: 60 tablet, Rfl: 4 .  meclizine (ANTIVERT) 25 MG tablet, Take 1 tablet (25 mg total) by mouth 3 (three) times daily as needed for dizziness., Disp: 30 tablet, Rfl: 0 .  metoprolol tartrate (LOPRESSOR) 25 MG tablet, TAKE 1 TABLET BY MOUTH TWICE A DAY, Disp: 60 tablet, Rfl: 1 .  montelukast (SINGULAIR) 10 MG tablet, Take 1 tablet (10 mg total) by mouth at bedtime., Disp: 60 tablet, Rfl: 3 .  ondansetron (ZOFRAN) 8 MG tablet, One pill every 8 hours as needed for nausea/vomitting., Disp: 40 tablet, Rfl: 1 .  pregabalin (LYRICA) 100 MG capsule, TAKE 1 CAPSULE BY MOUTH TWICE A DAY, Disp: 60 capsule, Rfl:  2 .  prochlorperazine (COMPAZINE) 10 MG tablet, Take 1 tablet (10 mg total) by mouth every 6 (six) hours as needed for nausea or vomiting., Disp: 40 tablet, Rfl: 1 .  Vitamin D, Ergocalciferol, (DRISDOL) 1.25 MG (50000 UNIT) CAPS capsule, TAKE 1 CAPSULE (50,000 UNITS TOTAL) ONCE A WEEK BY MOUTH., Disp: 12 capsule, Rfl: 1 .  ipratropium (ATROVENT) 0.03 % nasal spray, Place 2 sprays into both nostrils every 12 (twelve) hours. (Patient not taking: Reported on 03/07/2020), Disp: 30 mL, Rfl: 12 .  levocetirizine (XYZAL) 5 MG tablet, TAKE 1 TABLET BY MOUTH EVERY DAY IN THE EVENING (Patient not taking: Reported on 03/14/2020), Disp: 90 tablet, Rfl: 1 No current facility-administered medications for this visit.  Facility-Administered Medications Ordered in Other Visits:  .  0.9 %   sodium chloride infusion, , Intravenous, Continuous, [name], MD, New Bag at 12/10/16 1140 .  0.9 %  sodium chloride infusion, , Intravenous, Continuous, Lloyd Huger, MD, Last Rate: 999 mL/hr at 04/24/15 1520, New Bag at 05/23/15 0951 .  0.9 %  sodium chloride infusion, , Intravenous, Continuous, Verlon Au, NP, Last Rate: 999 mL/hr at 10/31/19 1522, New Bag at 10/31/19 1522 .  heparin lock flush 100 unit/mL, 500 Units, Intravenous, Once, Berenzon, Dmitriy, MD .  sodium chloride 0.9 % injection 10 mL, 10 mL, Intravenous, PRN, Ma Hillock, Sandeep, MD, 10 mL at 04/04/15 1440 .  sodium chloride flush (NS) 0.9 % injection 10 mL, 10 mL, Intravenous, PRN, Berenzon, Dmitriy, MD   Allergies  Allergen Reactions  . No Known Allergies      Social History   Tobacco Use  . Smoking status: Light Tobacco Smoker    Packs/day: 0.25    Years: 18.00    Pack years: 4.50    Types: Cigarettes  . Smokeless tobacco: Never Used  Vaping Use  . Vaping Use: Never used  Substance Use Topics  . Alcohol use: No    Alcohol/week: 0.0 standard drinks  . Drug use: No      Chart Review Today: I personally reviewed active problem list, medication list, allergies, family history, social history, health maintenance, notes from last encounter, lab results, imaging with the patient/caregiver today.   Review of Systems 10 Systems reviewed and are negative for acute change except as noted in the HPI.     Objective:   Vitals:   04/02/20 1026  BP: 112/76  Pulse: 96  Resp: 16  Temp: (!) 97.1 F (36.2 C)  TempSrc: Temporal  SpO2: 95%  Weight: 145 lb 9.6 oz (66 kg)  Height: 5' 6"  (1.676 m)    Body mass index is 23.5 kg/m.  Physical Exam Vitals and nursing note reviewed.  Constitutional:      Appearance: She is well-developed.     Comments: Chronically ill appearing female, appears stated age, NAD  HENT:     Head: Normocephalic and atraumatic.     Right Ear: External ear normal.  Decreased hearing noted. No drainage, swelling or tenderness. There is impacted cerumen. No mastoid tenderness.     Left Ear: External ear normal. Decreased hearing noted. No drainage, swelling or tenderness. There is impacted cerumen. No mastoid tenderness.     Nose: Nose normal.  Eyes:     General:        Right eye: No discharge.        Left eye: No discharge.     Conjunctiva/sclera: Conjunctivae normal.  Neck:     Trachea: No tracheal deviation.  Cardiovascular:     Rate and Rhythm: Normal rate and regular rhythm.  Pulmonary:     Effort: Pulmonary effort is normal. No respiratory distress.     Breath sounds: No stridor.  Skin:    General: Skin is warm and dry.     Findings: No rash.  Neurological:     Mental Status: She is alert.     Motor: No abnormal muscle tone.     Gait: Gait abnormal.  Psychiatric:        Mood and Affect: Mood normal.        Behavior: Behavior normal.       Indication: Cerumen impaction of the ear(s)  Medical necessity statement: On physical examination, bilateral cerumen impairs clinically significant portions of the external auditory canal, and tympanic membrane bilaterally. Noted obstructive, copious cerumen that cannot be removed without magnification and instrumentations requiring MD/APP skills/procedure  Consent: Discussed benefits and risks of procedure and verbal consent obtained  Procedure:  Cerumen Disimpaction Patient was prepped for the procedure.  Utilized an otoscope to assess and take note of the ear canal, the tympanic membrane, and the presence, amount, and placement of the cerumen.  Gentle ear lavage with warm water and hydrogen peroxide performed on the bilateral ears.    Post procedure examination: shows cerumen was completely removed to left, not removed to right. Patient tolerated procedure well.  There were no complications and following the disimpaction the tympanic membrane was visible on the left, not visible on the right.   Left TM intact.   Right EAC with persisting cerumen impaction - hard dry wax, pt did not tolerate any manual manipulation or attempted removal with curette Left auditory canal was normal.   Pt tolerated procedure, reported improvement of symptoms to left ear after removal of cerumen, continued decreased hearing to right after procedure.  The patient is made aware that they may experience temporary vertigo, temporary hearing loss, and temporary discomfort.  If these symptom last for more than 24 hours to follow up in clinic.    Results for orders placed or performed in visit on 03/28/20  Comprehensive metabolic panel  Result Value Ref Range   Sodium 139 135 - 145 mmol/L   Potassium 4.0 3.5 - 5.1 mmol/L   Chloride 102 98 - 111 mmol/L   CO2 28 22 - 32 mmol/L   Glucose, Bld 83 70 - 99 mg/dL   BUN 10 8 - 23 mg/dL   Creatinine, Ser 0.75 0.44 - 1.00 mg/dL   Calcium 8.7 (L) 8.9 - 10.3 mg/dL   Total Protein 7.1 6.5 - 8.1 g/dL   Albumin 3.6 3.5 - 5.0 g/dL   AST 71 (H) 15 - 41 U/L   ALT 51 (H) 0 - 44 U/L   Alkaline Phosphatase 341 (H) 38 - 126 U/L   Total Bilirubin 0.9 0.3 - 1.2 mg/dL   GFR calc non Af Amer >60 >60 mL/min   GFR calc Af Amer >60 >60 mL/min   Anion gap 9 5 - 15  CBC with Differential  Result Value Ref Range   WBC 5.1 4.0 - 10.5 K/uL   RBC 3.99 3.87 - 5.11 MIL/uL   Hemoglobin 13.0 12.0 - 15.0 g/dL   HCT 38.4 36 - 46 %   MCV 96.2 80.0 - 100.0 fL   MCH 32.6 26.0 - 34.0 pg   MCHC 33.9 30.0 - 36.0 g/dL   RDW 18.8 (H) 11.5 - 15.5 %   Platelets 126 (L) 150 -  400 K/uL   nRBC 0.0 0.0 - 0.2 %   Neutrophils Relative % 75 %   Neutro Abs 3.8 1.7 - 7.7 K/uL   Lymphocytes Relative 13 %   Lymphs Abs 0.7 0.7 - 4.0 K/uL   Monocytes Relative 10 %   Monocytes Absolute 0.5 0 - 1 K/uL   Eosinophils Relative 2 %   Eosinophils Absolute 0.1 0 - 0 K/uL   Basophils Relative 0 %   Basophils Absolute 0.0 0 - 0 K/uL   Immature Granulocytes 0 %   Abs Immature Granulocytes 0.02 0.00 - 0.07  K/uL   *Note: Due to a large number of results and/or encounters for the requested time period, some results have not been displayed. A complete set of results can be found in Results Review.       Assessment & Plan:     ICD-10-CM   1. Bilateral impacted cerumen  H61.23 Ear Lavage    CANCELED: Ear Lavage   success with irrigation and removal of left ear, still has right cerumen impactions- instructed to use debrox and return for irrigation/procedure  2. DNR (do not resuscitate)  Z66    reviewed her status with CA dx/prognosis/treatment  3. Cardiomyopathy, unspecified type ([place])  I42.9    on recent imaging 11/2019 - currently no CP or SOB  4. Peripheral neuropathy due to chemotherapy ([place])  G62.0    T45.1X5A    discussed management with lyrica - pt is getting infrequent refills, not taking as prescribed, minimal improvement of pain  5. Brain metastasis ([place])  C79.31    per oncology and rad/onc, multiple recent OV, tx, imaging reviewed today  6. Carcinoma of upper-outer quadrant of left breast in female, estrogen receptor positive ([place])  C50.412    Z17.0    per oncology = metastatic  7. DNR (do not resuscitate) discussion  Z71.89    reviewed and clarified pt's wishes for end of life, she previously discussed with palliative care and oncology - on file in chart  8. Weakness of right lower extremity  R29.898    For #8 and #9: She is working with [facility]  HH/NH/hospice, doing PT to help with strength, gait, balance and ability to get up off ground if she happens to fall   9. At high risk for falls  Z91.81    recent fall     Pt had not been in for a long time, I only saw her once in the past, I have been reviewing frequent charts from specialists and signing her [place] orders.  She presented for acute complaint, but I did take the opportunity to review and f/up with her on most notable and concerning health changes/dx/prognosis.   Her dysphagia is better after seeing  GI    Encouraged her to f/up for repeat cerument disimpaction/irrigation after using softing drops, and to also get routine f/up   Delsa Grana, PA-C 04/02/20 10:31 AM

## 2020-04-04 DIAGNOSIS — R404 Transient alteration of awareness: Secondary | ICD-10-CM | POA: Diagnosis not present

## 2020-04-08 ENCOUNTER — Other Ambulatory Visit: Payer: Medicare HMO | Admitting: Nurse Practitioner

## 2020-04-08 ENCOUNTER — Encounter: Payer: Self-pay | Admitting: Nurse Practitioner

## 2020-04-08 ENCOUNTER — Other Ambulatory Visit: Payer: Self-pay

## 2020-04-08 DIAGNOSIS — C50919 Malignant neoplasm of unspecified site of unspecified female breast: Secondary | ICD-10-CM | POA: Diagnosis not present

## 2020-04-08 DIAGNOSIS — Z515 Encounter for palliative care: Secondary | ICD-10-CM

## 2020-04-08 NOTE — Progress Notes (Signed)
[facility] Consult Note Telephone: [phone]  Fax: [phone]  PATIENT NAME: Sarah Horne DOB: November 26, 1955 MRN: [id number]  PRIMARY CARE PROVIDER:   Delsa Grana, PA-C  REFERRING PROVIDER:  Delsa Grana, PA-C [address] [place],   [address]  RESPONSIBLE PARTY:  Self   Due to the COVID-19 crisis, this visit was done via telemedicine from my office and it was initiated and consent by this patient and or family.  RECOMMENDATIONS and PLAN: 1.ACP:DNR; MOST form completed in [facility]; DNR/Limited interventions, wishes for antibiotics, IVF, no feeding tube, continue with chemotherapy  2.Palliative care encounter; Palliative medicine team will continue to support patient, patient's family, and medical team. Visit consisted of counseling and education dealing with the complex and emotionally intense issues of symptom management and palliative care in the setting of serious and potentially life-threatening illness  I spent 50 minutes providing this consultation,  from 1:00pm to 1:50pm. More than 50% of the time in this consultation was spent coordinating communication.   HISTORY OF PRESENT ILLNESS:  Sarah Horne is a 64 y.o. 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 Palliative care follow-up visit telemedicine as telephonic not available with [name]. We talked about  purpose of palliative care visit. [name] in agreement. We talked about how [name] has been feeling. [name] endorses that she is doing okay. Ms Fry is taking one day at a time. We talked about recent appointments including last time ecology visit. We talked about plan of care. [name] talked about her brother who is a big support for her and helps her with groceries, medications and getting her errands done. We talked about symptoms of pain which [name] endorses is managed with her medications. [name] endorses it is not perfect but it gets her one day to the next. We talked about medications. We talked about appetite. [name] did gain 4 lbs since last visit. Praise [name] for eating. [name] endorses she does not want to be skin and bones so she is working hard to try to improve her weight despite not being hungry. We talked about medical goals of. We talked about coping strategies. We talked about [name] daily routine. We talked about quality-of-life. We talked about role of palliative care and plan of care. Talk about follow up palliative care visit in 2 months if needed or sooner should she declined. [name] an agreement an appointment scheduled. Therapeutic listening and emotional support provided. Contact information. Questions answered to satisfaction. Palliative Care was asked to help address goals of care.   CODE STATUS: DNR  PPS: 50% HOSPICE ELIGIBILITY/DIAGNOSIS: TBD  PAST MEDICAL HISTORY:  Past Medical History:  Diagnosis Date  . Chemotherapy-induced peripheral neuropathy ([place])   . Epistaxis    a. 11/2016 in setting of asa/plavix-->silver nitrate cauterization.  . GI bleed    a. 11/2016 Admission w/ GIB and hypovolemic shock req 3u PRBC's;  b. 11/2016 ECG: gastritis & nonbleeding peptic ulcer; c. 11/2016 Conlonoscopy: rectal and sigmoid colonic ulcers.  . Heart attack ([place])    a. 1998  Cath @ [facility]: reportedly no intervention required.  Marland Kitchen Herceptin-induced  cardiomyopathy ([place])    a. In the setting of Herceptin Rx for breast cancer (initiated 12/2014); b. 03/2015 MUGA EF 64%; b. 08/2015 MUGA: EF 51%; c. 10/2015 MUGA: EF 44%; d. 11/2015 Echo: EF 45-50%; e. 01/2016 MUGA: EF 60%; f. 06/2016 MUGA EF 65%; g. 10/2016 MUGA: EF 61%;  h. 12/2016 Echo: EF 55-60%, gr1 DD.  Marland Kitchen Hyperlipidemia   . Hypertension   . Neuropathy   . Possible PAD (peripheral artery disease) ([place])    a. 11/2016 LE cyanosis and weak pulses-->CTA w/o significant Ao-BiFem dzs. ? distal dzs-->ASA/Plavix initiated by vascular surgery.  Marland Kitchen PSVT (paroxysmal supraventricular tachycardia) ([place])    a. Dx 11/2016.  . Pulmonary embolism ([place])    a. 12/2016 CTA Chest: small nonocclusive PE in inferior segment of the Left lingula, somewhat eccentric filling defect suggesting chronic rather than acute embolic event; b. 09/4479 LE U/S:  No DVT; c. 12/2016 Echo: Nl RV fxn, nl PASP.  Marland Kitchen Recurrent Metastatic breast cancer ([place])    a. Dx 2016: Stage II, ER positive, PR positive, HER-2/neu overexpressing of the left breast-->chemo/radiation; b. 10/2016 CT Abd/pelvis: diffuse liver mets, ill defined sclerotic bone lesions-T12;  c. 10/2016 MRI brain: metastatic lesion along L temporal lobe (19x23m) w/ extensive surrounding edema & 59mmidline shift to right.  . [place]is [place]  . Sinus tachycardia     SOCIAL HX:  Social History   Tobacco Use  . Smoking status: Light Tobacco Smoker    Packs/day: 0.25    Years: 18.00    Pack years: 4.50    Types: Cigarettes  . Smokeless tobacco: Never Used  Substance Use Topics  . Alcohol use: No    Alcohol/week: 0.0 standard drinks    ALLERGIES:  Allergies  Allergen Reactions  . No Known Allergies      PERTINENT MEDICATIONS:  Outpatient Encounter Medications as of 04/08/2020  Medication Sig  . acetaminophen (TYLENOL) 650 MG CR tablet Take 650 mg by mouth every 8 (eight) hours as needed for pain.  . Marland Kitchenpixaban (ELIQUIS) 5 MG TABS tablet Take 1 tablet (5 mg total) by mouth 2  (two) times daily.  . flecainide (TAMBOCOR) 50 MG tablet Take 1 tablet (50 mg total) by mouth 2 (two) times daily.  . meclizine (ANTIVERT) 25 MG tablet Take 1 tablet (25 mg total) by mouth 3 (three) times daily as needed for dizziness.  . metoprolol tartrate (LOPRESSOR) 25 MG tablet TAKE 1 TABLET BY MOUTH TWICE A DAY  . montelukast (SINGULAIR) 10 MG tablet Take 1 tablet (10 mg total) by mouth at bedtime.  . ondansetron (ZOFRAN) 8 MG tablet One pill every 8 hours as needed for nausea/vomitting.  . pregabalin (LYRICA) 100 MG capsule TAKE 1 CAPSULE BY MOUTH TWICE A DAY  . prochlorperazine (COMPAZINE) 10 MG tablet Take 1 tablet (10 mg total) by mouth every 6 (six) hours as needed for nausea or vomiting.  . Vitamin D, Ergocalciferol, (DRISDOL) 1.25 MG (50000 UNIT) CAPS capsule TAKE 1 CAPSULE (50,000 UNITS TOTAL) ONCE A WEEK BY MOUTH.  . [DISCONTINUED] ipratropium (ATROVENT) 0.03 % nasal spray Place 2 sprays into both nostrils every 12 (twelve) hours. (Patient not taking: Reported on 03/07/2020)  . [DISCONTINUED] levocetirizine (XYZAL) 5 MG tablet TAKE 1 TABLET BY MOUTH EVERY DAY IN THE EVENING (Patient not taking: Reported on 03/14/2020)  . [DISCONTINUED] magnesium oxide (MAG-OX) 400 MG tablet TAKE 1 TABLET BY MOUTH TWICE A DAY   Facility-Administered  Encounter Medications as of 04/08/2020  Medication  . 0.9 %  sodium chloride infusion  . 0.9 %  sodium chloride infusion  . 0.9 %  sodium chloride infusion  . heparin lock flush 100 unit/mL  . sodium chloride 0.9 % injection 10 mL  . sodium chloride flush (NS) 0.9 % injection 10 mL    PHYSICAL EXAM:   Deferred   Gretchen Weinfeld Z Cameka Rae, NP

## 2020-04-10 ENCOUNTER — Encounter: Payer: Self-pay | Admitting: Internal Medicine

## 2020-04-10 NOTE — Progress Notes (Signed)
Pt was given Keppra on 7/8 by an NP, but hasn't started taking it yet because she wants to talk to Dr. B about it prior to starting it. States that she's been having issues with her right leg intermittently shaking without warning, and lasts a couple of seconds. When this happens if she's not near something to grab it causes her to fall. States that it's been about 3 months since her leg did this.

## 2020-04-11 ENCOUNTER — Encounter: Payer: Self-pay | Admitting: Internal Medicine

## 2020-04-11 ENCOUNTER — Inpatient Hospital Stay: Payer: Medicare HMO

## 2020-04-11 ENCOUNTER — Inpatient Hospital Stay: Payer: Medicare HMO | Admitting: Hospice and Palliative Medicine

## 2020-04-11 ENCOUNTER — Inpatient Hospital Stay (HOSPITAL_BASED_OUTPATIENT_CLINIC_OR_DEPARTMENT_OTHER): Payer: Medicare HMO | Admitting: Internal Medicine

## 2020-04-11 ENCOUNTER — Other Ambulatory Visit: Payer: Self-pay

## 2020-04-11 DIAGNOSIS — C50412 Malignant neoplasm of upper-outer quadrant of left female breast: Secondary | ICD-10-CM | POA: Diagnosis not present

## 2020-04-11 DIAGNOSIS — C7800 Secondary malignant neoplasm of unspecified lung: Secondary | ICD-10-CM | POA: Diagnosis not present

## 2020-04-11 DIAGNOSIS — Z79818 Long term (current) use of other agents affecting estrogen receptors and estrogen levels: Secondary | ICD-10-CM | POA: Diagnosis not present

## 2020-04-11 DIAGNOSIS — Z170 Estrogen receptor positive status [ER+]: Secondary | ICD-10-CM

## 2020-04-11 DIAGNOSIS — C7931 Secondary malignant neoplasm of brain: Secondary | ICD-10-CM | POA: Diagnosis not present

## 2020-04-11 DIAGNOSIS — Z95828 Presence of other vascular implants and grafts: Secondary | ICD-10-CM

## 2020-04-11 DIAGNOSIS — Z7189 Other specified counseling: Secondary | ICD-10-CM

## 2020-04-11 DIAGNOSIS — Z515 Encounter for palliative care: Secondary | ICD-10-CM | POA: Diagnosis not present

## 2020-04-11 DIAGNOSIS — Z5189 Encounter for other specified aftercare: Secondary | ICD-10-CM | POA: Diagnosis not present

## 2020-04-11 DIAGNOSIS — C787 Secondary malignant neoplasm of liver and intrahepatic bile duct: Secondary | ICD-10-CM | POA: Diagnosis not present

## 2020-04-11 DIAGNOSIS — Z5111 Encounter for antineoplastic chemotherapy: Secondary | ICD-10-CM | POA: Diagnosis not present

## 2020-04-11 LAB — COMPREHENSIVE METABOLIC PANEL
ALT: 56 U/L — ABNORMAL HIGH (ref 0–44)
AST: 68 U/L — ABNORMAL HIGH (ref 15–41)
Albumin: 3.7 g/dL (ref 3.5–5.0)
Alkaline Phosphatase: 413 U/L — ABNORMAL HIGH (ref 38–126)
Anion gap: 9 (ref 5–15)
BUN: 18 mg/dL (ref 8–23)
CO2: 29 mmol/L (ref 22–32)
Calcium: 8.9 mg/dL (ref 8.9–10.3)
Chloride: 103 mmol/L (ref 98–111)
Creatinine, Ser: 0.7 mg/dL (ref 0.44–1.00)
GFR calc Af Amer: >60 mL/min (ref >60)
GFR calc non Af Amer: >60 mL/min (ref >60)
Glucose, Bld: 89 mg/dL (ref 70–99)
Potassium: 3.9 mmol/L (ref 3.5–5.1)
Sodium: 141 mmol/L (ref 135–145)
Total Bilirubin: 1 mg/dL (ref 0.3–1.2)
Total Protein: 7.2 g/dL (ref 6.5–8.1)

## 2020-04-11 LAB — CBC WITH DIFFERENTIAL/PLATELET
Abs Immature Granulocytes: 0.01 10*3/uL (ref 0.00–0.07)
Basophils Absolute: 0 10*3/uL (ref 0.0–0.1)
Basophils Relative: 1 %
Eosinophils Absolute: 0.1 10*3/uL (ref 0.0–0.5)
Eosinophils Relative: 2 %
HCT: 39.7 % (ref 36.0–46.0)
Hemoglobin: 13.6 g/dL (ref 12.0–15.0)
Immature Granulocytes: 0 %
Lymphocytes Relative: 18 %
Lymphs Abs: 0.7 10*3/uL (ref 0.7–4.0)
MCH: 32.9 pg (ref 26.0–34.0)
MCHC: 34.3 g/dL (ref 30.0–36.0)
MCV: 95.9 fL (ref 80.0–100.0)
Monocytes Absolute: 0.5 10*3/uL (ref 0.1–1.0)
Monocytes Relative: 13 %
Neutro Abs: 2.6 10*3/uL (ref 1.7–7.7)
Neutrophils Relative %: 66 %
Platelets: 150 10*3/uL (ref 150–400)
RBC: 4.14 MIL/uL (ref 3.87–5.11)
RDW: 17.9 % — ABNORMAL HIGH (ref 11.5–15.5)
WBC: 4 10*3/uL (ref 4.0–10.5)
nRBC: 0 % (ref 0.0–0.2)

## 2020-04-11 MED ORDER — HEPARIN SOD (PORK) LOCK FLUSH 100 UNIT/ML IV SOLN
INTRAVENOUS | Status: AC
  Filled 2020-04-11: qty 5

## 2020-04-11 MED ORDER — SODIUM CHLORIDE 0.9 % IV SOLN
Freq: Once | INTRAVENOUS | Status: AC
  Filled 2020-04-11: qty 250

## 2020-04-11 MED ORDER — SODIUM CHLORIDE 0.9% FLUSH
10.0000 mL | INTRAVENOUS | Status: DC | PRN
  Administered 2020-04-11: 10 mL via INTRAVENOUS
  Filled 2020-04-11: qty 10

## 2020-04-11 MED ORDER — PEGFILGRASTIM 6 MG/0.6ML ~~LOC~~ PSKT
6.0000 mg | PREFILLED_SYRINGE | Freq: Once | SUBCUTANEOUS | Status: AC
  Administered 2020-04-11: 6 mg via SUBCUTANEOUS
  Filled 2020-04-11: qty 0.6

## 2020-04-11 MED ORDER — HEPARIN SOD (PORK) LOCK FLUSH 100 UNIT/ML IV SOLN
500.0000 [IU] | Freq: Once | INTRAVENOUS | Status: AC | PRN
  Administered 2020-04-11: 500 [IU]
  Filled 2020-04-11: qty 5

## 2020-04-11 MED ORDER — SODIUM CHLORIDE 0.9 % IV SOLN
1800.0000 mg | Freq: Once | INTRAVENOUS | Status: AC
  Administered 2020-04-11: 1800 mg via INTRAVENOUS
  Filled 2020-04-11: qty 26.3

## 2020-04-11 MED ORDER — PROCHLORPERAZINE MALEATE 10 MG PO TABS
10.0000 mg | ORAL_TABLET | Freq: Once | ORAL | Status: AC
  Administered 2020-04-11: 10 mg via ORAL
  Filled 2020-04-11: qty 1

## 2020-04-11 NOTE — Progress Notes (Signed)
[facility] OFFICE PROGRESS NOTE  Patient Care Team: Delsa Grana, PA-C as PCP - General (Family Medicine) Wellington Hampshire, MD as PCP - Cardiology (Cardiology) Bary Castilla, Forest Gleason, MD (General Surgery) Vladimir Crofts, MD as Consulting Physician (Neurology) Cammie Sickle, MD as Consulting Physician (Internal Medicine)  Cancer Staging No matching staging information was found for the patient.   Oncology History Overview Note  # LEFT BREAST IDC; STAGE II [cT2N1] ER >90%; PR- 50-90%; her 2 NEU- POS; s/p Neoadj chemo; AUG 2016- TCH+P s/p Lumpec & partial ALND- path CR; s/p RT [finished Nov 2016];  adj Herceptin; HELD for Jan 19th 2017 [in DEC 2016-EF dropped from 63 to 51%; FEB 27th EF-42.9%]; JAN 2016 START Arimidex; May 2017- EF- 60%; May 24th 2017-Re-start Herceptin q 3W; July 28th STOP herceptin [finished 62m/m2; sec to Low EF; however 2017 Oct EF= 67%; improved]  # DEC 4th 2017- Start Neratinib 4 pills; DEC 11th 3 pills; STOPPED.  # FEB 2018- RECURRENCE ER/PR positive; Her 2 NEGATIVE [liver Bx]  # MARCH 1st 2018- Tax-Cytoxan [poor tol]  # FEB 2018- Brain mets [SBRT; Dr.Crystal; finished Feb 28th 2018]  # March 2018- Eribulin- poor tolerance  # June 8th 2018- Started Faslodex + Abema [abema-multiple interruptions sec to neutropenia]  # MRI Brain- March 2019- Leptomeningeal mets [WBRT; No LP s/p WBRT- 01/09/2018]; STOP Abema [557mday-sec to severe cytopenia]+faslodex  # MAY 20tth 019- Start Xeloda; STOPPED in Nov 22nd 2019- sec to Progressive liver lesions on CT scan  # NOV 26th 2019- Faslodex + Piqaray 25069m; Jan 2020- Piqray 300 mg+ faslodex; FEB 17th CT scan- mixed response/ Overall progression; STOP Piqray + faslodex.   # FEB 25th 2020- ERIBULIN; Poor tolerance sec to severe neutropenia.  Stopped May 2020.  CT scan May 2020-new/progressive 2 cm liver lesion;   #July nd 2020-Ixempra x5 cycles-; NOV 2020-MRI liver- worsening liver lesions [rising  AST/ALT].   # NOV [id number] weekly [consent-]; December 31-2020-progression/rising LFTs.  Stop Taxol  #January 15th, 2021-Adriamycin weekly [C]; MARCH 2021- Partial response. STOP ADRIAMYCIN sec to EF drop 40-45%.   # April 2nd-2021-gemcitabine; 01/15/2020-GEM [800 mg/m2; #3 cycle 1000m41m] q 2 W; with onpro.   #May 06, 2019-MRI brain 5 new lesions [3 mm to 10 mm]-asymptomatic; [previous whole brain radiation]- OCT 2020- Re-Irradiation.   # PN- G-2; may 2017- Cymbalta 60mg90mARCH 2018-  acute vascular insuff of BIL LE [? Taxotere vasoconstriction on asprin]; # hemorragic shock LGIB [s/p colo; Dr.Wohl]  #  SVT- on flecainide.   # April 2016- Liver Bx- NEG;   # Drop in EF from Herceptin [recovered OCt 27th- EF 65%]; March 2020 ejection fraction 40 to 45%.  Stop Adriamycin.  # BMD- jan 2017- osteopenia; #Gait instability/visual disturbance-[Dr.Shah]- S/p EEG [5/13]- negative for seizures.  # 2020- SEP-s/p Pallaitive care evaluation  -----------------------------------------------------------     MOLECULAR TESTING- [facility]- Sep 4th 2018- PDL-1 0%/NEG for BRCA; MSI-Stable; Positive for PI3K; ccnd-1; FGF** -----------------------------------------------------------  Dx: Metastatic Breast cancer [ER/PR-Pos; her -2 NEG] Stage IV; goals: palliative Current treatment-gemcitabine chemotherapy [C]   Carcinoma of upper-outer quadrant of left breast in female, estrogen receptor positive (HCC) [place]25/2020 - 01/17/2019 Chemotherapy   The patient had eriBULin mesylate (HALAVEN) 2.1 mg in sodium chloride 0.9 % 100 mL chemo infusion, 1.2 mg/m2 = 2.1 mg (100 % of original dose 1.2 mg/m2), Intravenous,  Once, 3 of 4 cycles Dose modification: 1.2 mg/m2 (original dose 1.2 mg/m2, Cycle 1, Reason: Provider Judgment), 0.8 mg/m2 (original  dose 1.2 mg/m2, Cycle 3, Reason: Provider Judgment) Administration: 2.1 mg (11/22/2018), 2.1 mg (11/29/2018), 2.1 mg (12/20/2018), 1.4 mg (01/17/2019)  for  chemotherapy treatment.    03/30/2019 - 07/12/2019 Chemotherapy   The patient had pegfilgrastim (NEULASTA ONPRO KIT) injection 6 mg, 6 mg, Subcutaneous, Once, 5 of 8 cycles Administration: 6 mg (03/30/2019), 6 mg (04/20/2019), 6 mg (05/11/2019), 6 mg (06/22/2019), 6 mg (06/01/2019) ixabepilone (IXEMPRA) 60 mg in lactated ringers 250 mL chemo infusion, 33.5 mg/m2 = 57 mg (100 % of original dose 32 mg/m2), Intravenous,  Once, 5 of 8 cycles Dose modification: 32 mg/m2 (original dose 32 mg/m2, Cycle 1, Reason: Provider Judgment) Administration: 72 mg (04/20/2019), 72 mg (05/11/2019), 72 mg (06/22/2019), 72 mg (06/01/2019)  for chemotherapy treatment.    08/18/2019 - 09/28/2019 Chemotherapy   The patient had PACLitaxel (TAXOL) 150 mg in sodium chloride 0.9 % 250 mL chemo infusion (</= 32m/m2), 80 mg/m2 = 150 mg, Intravenous,  Once, 1 of 4 cycles Administration: 150 mg (08/18/2019), 150 mg (09/08/2019), 150 mg (09/15/2019)  for chemotherapy treatment.    10/13/2019 - 11/30/2019 Chemotherapy   The patient had DOXOrubicin (ADRIAMYCIN) chemo injection 38 mg, 20 mg/m2 = 38 mg, Intravenous,  Once, 6 of 9 cycles Administration: 38 mg (10/13/2019), 38 mg (10/20/2019), 38 mg (10/27/2019), 38 mg (11/17/2019), 38 mg (11/24/2019), 38 mg (11/10/2019) palonosetron (ALOXI) injection 0.25 mg, 0.25 mg, Intravenous,  Once, 6 of 9 cycles Administration: 0.25 mg (10/13/2019), 0.25 mg (10/20/2019), 0.25 mg (10/27/2019), 0.25 mg (11/17/2019), 0.25 mg (11/24/2019), 0.25 mg (11/10/2019)  for chemotherapy treatment.    12/29/2019 -  Chemotherapy   The patient had pegfilgrastim (NEULASTA ONPRO KIT) injection 6 mg, 6 mg, Subcutaneous, Once, 7 of 10 cycles Administration: 6 mg (01/15/2020), 6 mg (01/29/2020), 6 mg (02/12/2020), 6 mg (02/28/2020), 6 mg (03/14/2020), 6 mg (03/28/2020), 6 mg (04/11/2020) gemcitabine (GEMZAR) 1,444 mg in sodium chloride 0.9 % 250 mL chemo infusion, 800 mg/m2 = 1,444 mg, Intravenous,  Once, 8 of 11 cycles Dose modification: 1,000  mg/m2 (original dose 800 mg/m2, Cycle 3, Reason: Provider Judgment) Administration: 1,444 mg (12/29/2019), 1,444 mg (01/15/2020), 1,800 mg (01/29/2020), 1,800 mg (02/12/2020), 1,800 mg (02/28/2020), 1,800 mg (03/14/2020), 1,800 mg (03/28/2020), 1,800 mg (04/11/2020)  for chemotherapy treatment.      INTERVAL HISTORY:  Sarah GLENDENNING89y.o.  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  Patient denies any weight loss.  Denies any headaches.  Denies any nausea vomiting.  No falls.  Patient has been evaluated by PCP for hearing difficulty-recommended de-waxing.    Review of Systems  Constitutional: Positive for malaise/fatigue and weight loss. Negative for chills, diaphoresis and fever.  HENT: Negative for nosebleeds and sore throat.   Eyes: Negative for double vision.  Respiratory: Negative for cough, hemoptysis, sputum production, shortness of breath and wheezing.   Cardiovascular: Negative for chest pain, palpitations, orthopnea and leg swelling.  Gastrointestinal: Positive for constipation. Negative for abdominal pain, blood in stool, heartburn, melena, nausea and vomiting.  Genitourinary: Negative for dysuria, frequency and urgency.  Musculoskeletal: Positive for joint pain. Negative for back pain.  Skin: Negative for itching.  Neurological: Positive for dizziness and tingling. Negative for focal weakness, weakness and headaches.  Endo/Heme/Allergies: Does not bruise/bleed easily.  Psychiatric/Behavioral: Negative for depression. The patient is not nervous/anxious and does not have insomnia.     PAST MEDICAL HISTORY :  Past Medical History:  Diagnosis Date   Chemotherapy-induced peripheral neuropathy (HCC)    Epistaxis    a. 11/2016 in setting of asa/plavix-->silver nitrate cauterization.   GI bleed    a. 11/2016 Admission w/ GIB and hypovolemic shock req 3u  PRBC's;  b. 11/2016 ECG: gastritis & nonbleeding peptic ulcer; c. 11/2016 Conlonoscopy: rectal and sigmoid colonic ulcers.   Heart attack ([place])    a. 1998 Cath @ [facility]: reportedly no intervention required.   Herceptin-induced cardiomyopathy ([place])    a. In the setting of Herceptin Rx for breast cancer (initiated 12/2014); b. 03/2015 MUGA EF 64%; b. 08/2015 MUGA: EF 51%; c. 10/2015 MUGA: EF 44%; d. 11/2015 Echo: EF 45-50%; e. 01/2016 MUGA: EF 60%; f. 06/2016 MUGA EF 65%; g. 10/2016 MUGA: EF 61%;  h. 12/2016 Echo: EF 55-60%, gr1 DD.   Hyperlipidemia    Hypertension    Neuropathy    Possible PAD (peripheral artery disease) ([place])    a. 11/2016 LE cyanosis and weak pulses-->CTA w/o significant Ao-BiFem dzs. ? distal dzs-->ASA/Plavix initiated by vascular surgery.   PSVT (paroxysmal supraventricular tachycardia) ([place])    a. Dx 11/2016.   Pulmonary embolism ([place])    a. 12/2016 CTA Chest: small nonocclusive PE in inferior segment of the Left lingula, somewhat eccentric filling defect suggesting chronic rather than acute embolic event; b. 10/9560 LE U/S:  No DVT; c. 12/2016 Echo: Nl RV fxn, nl PASP.   Recurrent Metastatic breast cancer ([place])    a. Dx 2016: Stage II, ER positive, PR positive, HER-2/neu overexpressing of the left breast-->chemo/radiation; b. 10/2016 CT Abd/pelvis: diffuse liver mets, ill defined sclerotic bone lesions-T12;  c. 10/2016 MRI brain: metastatic lesion along L temporal lobe (19x14m) w/ extensive surrounding edema & 525mmidline shift to right.   Sepsis (HCCamas   Sinus tachycardia     PAST SURGICAL HISTORY :   Past Surgical History:  Procedure Laterality Date   BREAST BIOPSY Left 2016   Positive   BREAST LUMPECTOMY WITH SENTINEL LYMPH NODE BIOPSY Left 05/23/2015   Procedure: LEFT BREAST WIDE EXCISION WITH AXILLARY DISSECTION, MASTOPLASTY ;  Surgeon: JeRobert BellowMD;  Location: [facility] ORS;  Service: General;  Laterality: Left;   BREAST SURGERY Left 12/18/14   breast  biopsy/INVASIVE DUCTAL CARCINOMA OF BREAST, NOTTINGHAM GRADE 2.   BREAST SURGERY  05/23/2015.   Wide excision/mastoplasty, axillary dissection. No residual invasive cancer, positive for residual DCIS. 0/2 nodes identified on axillary dissection. (no SLN by technetium or methylene blue)   CARDIAC CATHETERIZATION     COLONOSCOPY WITH PROPOFOL N/A 12/10/2016   Procedure: COLONOSCOPY WITH PROPOFOL;  Surgeon: DaLucilla LameMD;  Location: [facility] ENDOSCOPY;  Service: Endoscopy;  Laterality: N/A;   COLONOSCOPY WITH PROPOFOL N/A 02/13/2017   Procedure: COLONOSCOPY WITH PROPOFOL;  Surgeon: WoLucilla LameMD;  [facility];  Service: Endoscopy;  Laterality: N/A;   ESOPHAGOGASTRODUODENOSCOPY N/A 11/03/2019   Procedure: ESOPHAGOGASTRODUODENOSCOPY (EGD);  Surgeon: VaLin LandsmanMD;  [facility];  Service: Gastroenterology;  Laterality: N/A;   ESOPHAGOGASTRODUODENOSCOPY (EGD) WITH PROPOFOL N/A 12/08/2016   Procedure: ESOPHAGOGASTRODUODENOSCOPY (EGD) WITH PROPOFOL;  Surgeon: DaLucilla LameMD;  Location: [facility];  Service: Endoscopy;  Laterality: N/A;   IVC FILTER INSERTION N/A 02/15/2017   Procedure: IVC Filter Insertion;  Surgeon: DeAlgernon HuxleyMD;  [facility];  Service: Cardiovascular;  Laterality: N/A;   PORTACATH PLACEMENT Right 12-31-14   Dr ByBary Castilla  FAMILY HISTORY :   Family History  Problem Relation Age of Onset  Breast cancer Maternal Aunt    Breast cancer Cousin    Brain cancer Maternal Uncle    Diabetes Mother    Hypertension Mother    Stroke Mother     SOCIAL HISTORY:   Social History   Tobacco Use   Smoking status: Light Tobacco Smoker    Packs/day: 0.25    Years: 18.00    Pack years: 4.50    Types: Cigarettes   Smokeless tobacco: Never Used  Vaping Use   Vaping Use: Never used  Substance Use Topics   Alcohol use: No    Alcohol/week: 0.0 standard drinks   Drug use: No    ALLERGIES:  is allergic to no known  allergies.  MEDICATIONS:  Current Outpatient Medications  Medication Sig Dispense Refill   acetaminophen (TYLENOL) 650 MG CR tablet Take 650 mg by mouth every 8 (eight) hours as needed for pain.     apixaban (ELIQUIS) 5 MG TABS tablet Take 1 tablet (5 mg total) by mouth 2 (two) times daily. 60 tablet 4   flecainide (TAMBOCOR) 50 MG tablet Take 1 tablet (50 mg total) by mouth 2 (two) times daily. 60 tablet 4   magnesium oxide (MAG-OX) 400 MG tablet TAKE 1 TABLET BY MOUTH TWICE A DAY 60 tablet 4   meclizine (ANTIVERT) 25 MG tablet Take 1 tablet (25 mg total) by mouth 3 (three) times daily as needed for dizziness. 30 tablet 0   metoprolol tartrate (LOPRESSOR) 25 MG tablet TAKE 1 TABLET BY MOUTH TWICE A DAY 60 tablet 1   montelukast (SINGULAIR) 10 MG tablet Take 1 tablet (10 mg total) by mouth at bedtime. 60 tablet 3   ondansetron (ZOFRAN) 8 MG tablet One pill every 8 hours as needed for nausea/vomitting. 40 tablet 1   pregabalin (LYRICA) 100 MG capsule TAKE 1 CAPSULE BY MOUTH TWICE A DAY 60 capsule 2   prochlorperazine (COMPAZINE) 10 MG tablet Take 1 tablet (10 mg total) by mouth every 6 (six) hours as needed for nausea or vomiting. 40 tablet 1   Vitamin D, Ergocalciferol, (DRISDOL) 1.25 MG (50000 UNIT) CAPS capsule TAKE 1 CAPSULE (50,000 UNITS TOTAL) ONCE A WEEK BY MOUTH. 12 capsule 1   levETIRAcetam (KEPPRA) 250 MG tablet Take 1 tablet by mouth 2 (two) times daily. (Patient not taking: Reported on 04/10/2020)     No current facility-administered medications for this visit.   Facility-Administered Medications Ordered in Other Visits  Medication Dose Route Frequency Provider Last Rate Last Admin   0.9 %  sodium chloride infusion   Intravenous Continuous [name], MD   New Bag at 12/10/16 1140   0.9 %  sodium chloride infusion   Intravenous Continuous Lloyd Huger, MD 999 mL/hr at 04/24/15 1520 New Bag at 05/23/15 0951   0.9 %  sodium chloride infusion   Intravenous  Continuous Verlon Au, NP 999 mL/hr at 10/31/19 1522 New Bag at 10/31/19 1522   heparin lock flush 100 unit/mL  500 Units Intravenous Once Berenzon, Dmitriy, MD       sodium chloride 0.9 % injection 10 mL  10 mL Intravenous PRN [name], MD   10 mL at 04/04/15 1440   sodium chloride flush (NS) 0.9 % injection 10 mL  10 mL Intravenous PRN Berenzon, Dmitriy, MD        PHYSICAL EXAMINATION: ECOG PERFORMANCE STATUS: 1 - Symptomatic but completely ambulatory  BP 96/71 (BP Location: Left Arm, Patient Position: Sitting, Cuff Size: Normal)    Pulse 83  Temp (!) 96.5 F (35.8 C) (Tympanic)    Resp 20    Wt 142 lb 3.2 oz (64.5 kg)    SpO2 100%    BMI 22.95 kg/m   Filed Weights   04/11/20 0841  Weight: 142 lb 3.2 oz (64.5 kg)    Physical Exam Constitutional:      Comments: Patient is alone.  In wheel chair.   HENT:     Head: Normocephalic and atraumatic.     Mouth/Throat:     Pharynx: No oropharyngeal exudate.  Eyes:     Pupils: Pupils are equal, round, and reactive to light.  Cardiovascular:     Rate and Rhythm: Normal rate and regular rhythm.  Pulmonary:     Effort: Pulmonary effort is normal. No respiratory distress.     Breath sounds: Normal breath sounds. No wheezing.  Abdominal:     General: Bowel sounds are normal. There is no distension.     Palpations: Abdomen is soft. There is no mass.     Tenderness: There is no abdominal tenderness. There is no guarding or rebound.  Musculoskeletal:        General: No tenderness. Normal range of motion.     Cervical back: Normal range of motion and neck supple.  Skin:    General: Skin is warm.  Neurological:     Mental Status: She is alert and oriented to person, place, and time.     Comments: Broad based gait.  Unsteady.  Psychiatric:        Mood and Affect: Affect normal.     LABORATORY DATA:  I have reviewed the data as listed    Component Value Date/Time   NA 141 04/11/2020 0821   NA 135 03/04/2017 0918    NA 135 01/08/2015 0850   K 3.9 04/11/2020 0821   K 3.7 01/08/2015 0850   CL 103 04/11/2020 0821   CL 101 01/08/2015 0850   CO2 29 04/11/2020 0821   CO2 26 01/08/2015 0850   GLUCOSE 89 04/11/2020 0821   GLUCOSE 161 (H) 01/08/2015 0850   BUN 18 04/11/2020 0821   BUN 8 03/04/2017 0918   BUN 17 01/08/2015 0850   CREATININE 0.70 04/11/2020 0821   CREATININE 0.82 01/22/2015 1559   CALCIUM 8.9 04/11/2020 0821   CALCIUM 9.3 01/08/2015 0850   PROT 7.2 04/11/2020 0821   PROT 6.4 03/04/2017 0918   PROT 6.8 01/22/2015 1559   ALBUMIN 3.7 04/11/2020 0821   ALBUMIN 2.5 (L) 03/04/2017 0918   ALBUMIN 3.9 01/22/2015 1559   AST 68 (H) 04/11/2020 0821   AST 34 01/22/2015 1559   ALT 56 (H) 04/11/2020 0821   ALT 42 01/22/2015 1559   ALKPHOS 413 (H) 04/11/2020 0821   ALKPHOS 157 (H) 01/22/2015 1559   BILITOT 1.0 04/11/2020 0821   BILITOT 6.2 (H) 03/04/2017 0918   BILITOT 0.2 (L) 01/22/2015 1559   GFRNONAA >60 04/11/2020 0821   GFRNONAA >60 01/22/2015 1559   GFRAA >60 04/11/2020 0821   GFRAA >60 01/22/2015 1559    No results found for: SPEP, UPEP  Lab Results  Component Value Date   WBC 4.0 04/11/2020   NEUTROABS 2.6 04/11/2020   HGB 13.6 04/11/2020   HCT 39.7 04/11/2020   MCV 95.9 04/11/2020   PLT 150 04/11/2020      Chemistry      Component Value Date/Time   NA 141 04/11/2020 0821   NA 135 03/04/2017 0918   NA 135 01/08/2015 0850  K 3.9 04/11/2020 0821   K 3.7 01/08/2015 0850   CL 103 04/11/2020 0821   CL 101 01/08/2015 0850   CO2 29 04/11/2020 0821   CO2 26 01/08/2015 0850   BUN 18 04/11/2020 0821   BUN 8 03/04/2017 0918   BUN 17 01/08/2015 0850   CREATININE 0.70 04/11/2020 0821   CREATININE 0.82 01/22/2015 1559      Component Value Date/Time   CALCIUM 8.9 04/11/2020 0821   CALCIUM 9.3 01/08/2015 0850   ALKPHOS 413 (H) 04/11/2020 0821   ALKPHOS 157 (H) 01/22/2015 1559   AST 68 (H) 04/11/2020 0821   AST 34 01/22/2015 1559   ALT 56 (H) 04/11/2020 0821   ALT 42  01/22/2015 1559   BILITOT 1.0 04/11/2020 0821   BILITOT 6.2 (H) 03/04/2017 0918   BILITOT 0.2 (L) 01/22/2015 1559       RADIOGRAPHIC STUDIES: I have personally reviewed the radiological images as listed and agreed with the findings in the report. No results found.   ASSESSMENT & PLAN:  Carcinoma of upper-outer quadrant of left breast in female, estrogen receptor positive ([place]) # Recurrent metastatic breast cancer-lung/liver metastases ER PR positive HER-2 negative; on Gemcitabine; June 29th CT improvement of LUL ~2.9 cm; Liver lesions- smaller.   # proceed with gemcitabine #8 every 2 weeks[sec to neutropenia/on pro].  Labs today reviewed;  acceptable for treatment today.   #Gait instability/visual disturbance-[Dr.Shah]-clinically- STABLE; ; S/p EEG [5/13]- negative for seizures.recommend taking Keppra as recommended by neurology.   # cardiomyopathy/ Sinus tachycardia- on metoprolol-/Tamabcor; [march 2d-echo- 40-45%]; STABLE.   # Elevated LFTs-likely secondary underlying cirrhosis/ chemo/underlying malignancy- Alk phos rising- monitor for now.   # Peripheral neuropathy grade 1-2-  on Lyrica-STABLE.   # weight loss/ poor appetite; sec to malignancy/ chemo-stable  # Right lower extremity DVT bilateral [November 28, 2019] CT scan abdomen pelvis negative for any DVT.  On eliquis.; STABLE.    # DISPOSITION:  # gemcitabine/onprpo today;  # 2 weeks MD; labs- cbc/cmp;GEM- onpro; -Dr.B     Orders Placed This Encounter  Procedures   CBC with Differential    Standing Status:   Future    Standing Expiration Date:   04/11/2021   Comprehensive metabolic panel    Standing Status:   Future    Standing Expiration Date:   04/11/2021   All questions were answered. The patient knows to call the clinic with any problems, questions or concerns.     Cammie Sickle, MD 04/21/2020 6:06 PM

## 2020-04-11 NOTE — Assessment & Plan Note (Addendum)
#  Recurrent metastatic breast cancer-lung/liver metastases ER PR positive HER-2 negative; on Gemcitabine; June 29th CT improvement of LUL ~2.9 cm; Liver lesions- smaller.   # proceed with gemcitabine #8 every 2 weeks[sec to neutropenia/on pro].  Labs today reviewed;  acceptable for treatment today.   #Gait instability/visual disturbance-[Dr.Shah]-clinically- STABLE; ; S/p EEG [5/13]- negative for seizures.recommend taking Keppra as recommended by neurology.   # cardiomyopathy/ Sinus tachycardia- on metoprolol-/Tamabcor; [march 2d-echo- 40-45%]; STABLE.   # Elevated LFTs-likely secondary underlying cirrhosis/ chemo/underlying malignancy- Alk phos rising- monitor for now.   # Peripheral neuropathy grade 1-2-  on Lyrica-STABLE.   # weight loss/ poor appetite; sec to malignancy/ chemo-stable  # Right lower extremity DVT bilateral [November 28, 2019] CT scan abdomen pelvis negative for any DVT.  On eliquis.; STABLE.    # DISPOSITION:  # gemcitabine/onprpo today;  # 2 weeks MD; labs- cbc/cmp;GEM- onpro; -Dr.B

## 2020-04-12 ENCOUNTER — Encounter: Payer: Medicare HMO | Admitting: Hospice and Palliative Medicine

## 2020-04-19 ENCOUNTER — Ambulatory Visit: Payer: Medicare HMO | Admitting: Family Medicine

## 2020-04-25 ENCOUNTER — Other Ambulatory Visit: Payer: Self-pay

## 2020-04-25 ENCOUNTER — Inpatient Hospital Stay (HOSPITAL_BASED_OUTPATIENT_CLINIC_OR_DEPARTMENT_OTHER): Payer: Medicare HMO | Admitting: Hospice and Palliative Medicine

## 2020-04-25 ENCOUNTER — Encounter: Payer: Self-pay | Admitting: Internal Medicine

## 2020-04-25 ENCOUNTER — Inpatient Hospital Stay: Payer: Medicare HMO

## 2020-04-25 ENCOUNTER — Inpatient Hospital Stay (HOSPITAL_BASED_OUTPATIENT_CLINIC_OR_DEPARTMENT_OTHER): Payer: Medicare HMO | Admitting: Internal Medicine

## 2020-04-25 DIAGNOSIS — Z5111 Encounter for antineoplastic chemotherapy: Secondary | ICD-10-CM | POA: Diagnosis not present

## 2020-04-25 DIAGNOSIS — C50412 Malignant neoplasm of upper-outer quadrant of left female breast: Secondary | ICD-10-CM | POA: Diagnosis not present

## 2020-04-25 DIAGNOSIS — C787 Secondary malignant neoplasm of liver and intrahepatic bile duct: Secondary | ICD-10-CM | POA: Diagnosis not present

## 2020-04-25 DIAGNOSIS — Z79818 Long term (current) use of other agents affecting estrogen receptors and estrogen levels: Secondary | ICD-10-CM | POA: Diagnosis not present

## 2020-04-25 DIAGNOSIS — Z515 Encounter for palliative care: Secondary | ICD-10-CM

## 2020-04-25 DIAGNOSIS — Z170 Estrogen receptor positive status [ER+]: Secondary | ICD-10-CM | POA: Diagnosis not present

## 2020-04-25 DIAGNOSIS — Z7189 Other specified counseling: Secondary | ICD-10-CM

## 2020-04-25 DIAGNOSIS — C7931 Secondary malignant neoplasm of brain: Secondary | ICD-10-CM | POA: Diagnosis not present

## 2020-04-25 DIAGNOSIS — Z5189 Encounter for other specified aftercare: Secondary | ICD-10-CM | POA: Diagnosis not present

## 2020-04-25 DIAGNOSIS — C7800 Secondary malignant neoplasm of unspecified lung: Secondary | ICD-10-CM | POA: Diagnosis not present

## 2020-04-25 LAB — CBC WITH DIFFERENTIAL/PLATELET
Abs Immature Granulocytes: 0.02 10*3/uL (ref 0.00–0.07)
Basophils Absolute: 0 10*3/uL (ref 0.0–0.1)
Basophils Relative: 0 %
Eosinophils Absolute: 0.1 10*3/uL (ref 0.0–0.5)
Eosinophils Relative: 1 %
HCT: 40.3 % (ref 36.0–46.0)
Hemoglobin: 13.7 g/dL (ref 12.0–15.0)
Immature Granulocytes: 0 %
Lymphocytes Relative: 12 %
Lymphs Abs: 0.7 10*3/uL (ref 0.7–4.0)
MCH: 32.9 pg (ref 26.0–34.0)
MCHC: 34 g/dL (ref 30.0–36.0)
MCV: 96.6 fL (ref 80.0–100.0)
Monocytes Absolute: 0.6 10*3/uL (ref 0.1–1.0)
Monocytes Relative: 10 %
Neutro Abs: 4.8 10*3/uL (ref 1.7–7.7)
Neutrophils Relative %: 77 %
Platelets: 141 10*3/uL — ABNORMAL LOW (ref 150–400)
RBC: 4.17 MIL/uL (ref 3.87–5.11)
RDW: 16 % — ABNORMAL HIGH (ref 11.5–15.5)
WBC: 6.3 10*3/uL (ref 4.0–10.5)
nRBC: 0 % (ref 0.0–0.2)

## 2020-04-25 LAB — COMPREHENSIVE METABOLIC PANEL
ALT: 42 U/L (ref 0–44)
AST: 48 U/L — ABNORMAL HIGH (ref 15–41)
Albumin: 3.9 g/dL (ref 3.5–5.0)
Alkaline Phosphatase: 380 U/L — ABNORMAL HIGH (ref 38–126)
Anion gap: 10 (ref 5–15)
BUN: 17 mg/dL (ref 8–23)
CO2: 28 mmol/L (ref 22–32)
Calcium: 9 mg/dL (ref 8.9–10.3)
Chloride: 103 mmol/L (ref 98–111)
Creatinine, Ser: 0.71 mg/dL (ref 0.44–1.00)
GFR calc Af Amer: >60 mL/min (ref >60)
GFR calc non Af Amer: >60 mL/min (ref >60)
Glucose, Bld: 84 mg/dL (ref 70–99)
Potassium: 3.8 mmol/L (ref 3.5–5.1)
Sodium: 141 mmol/L (ref 135–145)
Total Bilirubin: 1 mg/dL (ref 0.3–1.2)
Total Protein: 7 g/dL (ref 6.5–8.1)

## 2020-04-25 MED ORDER — HEPARIN SOD (PORK) LOCK FLUSH 100 UNIT/ML IV SOLN
INTRAVENOUS | Status: AC
  Filled 2020-04-25: qty 5

## 2020-04-25 MED ORDER — HEPARIN SOD (PORK) LOCK FLUSH 100 UNIT/ML IV SOLN
500.0000 [IU] | Freq: Once | INTRAVENOUS | Status: AC
  Administered 2020-04-25: 500 [IU] via INTRAVENOUS
  Filled 2020-04-25: qty 5

## 2020-04-25 MED ORDER — PROCHLORPERAZINE MALEATE 10 MG PO TABS
10.0000 mg | ORAL_TABLET | Freq: Once | ORAL | Status: AC
  Administered 2020-04-25: 10 mg via ORAL
  Filled 2020-04-25: qty 1

## 2020-04-25 MED ORDER — SODIUM CHLORIDE 0.9 % IV SOLN
Freq: Once | INTRAVENOUS | Status: AC
  Filled 2020-04-25: qty 250

## 2020-04-25 MED ORDER — SODIUM CHLORIDE 0.9 % IV SOLN
1800.0000 mg | Freq: Once | INTRAVENOUS | Status: AC
  Administered 2020-04-25: 1800 mg via INTRAVENOUS
  Filled 2020-04-25: qty 21.04

## 2020-04-25 MED ORDER — PEGFILGRASTIM 6 MG/0.6ML ~~LOC~~ PSKT
6.0000 mg | PREFILLED_SYRINGE | Freq: Once | SUBCUTANEOUS | Status: AC
  Administered 2020-04-25: 6 mg via SUBCUTANEOUS
  Filled 2020-04-25: qty 0.6

## 2020-04-25 MED ORDER — SODIUM CHLORIDE 0.9% FLUSH
10.0000 mL | INTRAVENOUS | Status: DC | PRN
  Administered 2020-04-25: 10 mL via INTRAVENOUS
  Filled 2020-04-25: qty 10

## 2020-04-25 NOTE — Progress Notes (Signed)
[place]  [phone] [phone]   Name: Sarah Horne Date: 04/25/2020 MRN: [id number]  DOB: 08/06/1956  Patient Care Team: Delsa Grana, PA-C as PCP - General (Family Medicine) Wellington Hampshire, MD as PCP - Cardiology (Cardiology) Bary Castilla, Forest Gleason, MD (General Surgery) Vladimir Crofts, MD as Consulting Physician (Neurology) Cammie Sickle, MD as Consulting Physician (Internal Medicine)    REASON FOR CONSULTATION: Sarah Horne is a 64 y.o. female with multiple medical problems including stage IV breast cancer with leptomeningeal and liver metastases who has had disease progression on multiple previous lines of treatment. MRI of the brain on 05/06/2019 showeda several new metastatic lesions. MRI of the brain on 07/06/2019 also showed evidence of disease progression with enlarging existing lesions and note of interval new metastatic lesions. Patient was most recently rotated gemcitabine. Patient was referred to palliative care to help address goals and manage ongoing symptoms.  SOCIAL HISTORY:     reports that she has been smoking cigarettes. She has a 4.50 pack-year smoking history. She has never used smokeless tobacco. She reports that she does not drink alcohol and does not use drugs.  Patient is not married. She lives at home alone. She has a son and daughter who live about 30 minutes away. She has a brother who is involved in her care. 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  ADVANCE DIRECTIVES:  HC POA and living will on file dated 12/12/2016. Patient's brother is her healthcare power of attorney.  CODE STATUS: DNR/DNI (MOST form completed 07/13/2019)  PAST MEDICAL HISTORY: Past Medical History:  Diagnosis Date  . Chemotherapy-induced peripheral neuropathy ([place])   . Epistaxis    a. 11/2016 in setting of asa/plavix-->silver nitrate cauterization.  . GI bleed    a. 11/2016  Admission w/ GIB and hypovolemic shock req 3u PRBC's;  b. 11/2016 ECG: gastritis & nonbleeding peptic ulcer; c. 11/2016 Conlonoscopy: rectal and sigmoid colonic ulcers.  . Heart attack ([place])    a. 1998 Cath @ [facility]: reportedly no intervention required.  Marland Kitchen Herceptin-induced cardiomyopathy ([place])    a. In the setting of Herceptin Rx for breast cancer (initiated 12/2014); b. 03/2015 MUGA EF 64%; b. 08/2015 MUGA: EF 51%; c. 10/2015 MUGA: EF 44%; d. 11/2015 Echo: EF 45-50%; e. 01/2016 MUGA: EF 60%; f. 06/2016 MUGA EF 65%; g. 10/2016 MUGA: EF 61%;  h. 12/2016 Echo: EF 55-60%, gr1 DD.  Marland Kitchen Hyperlipidemia   . Hypertension   . Neuropathy   . Possible PAD (peripheral artery disease) ([place])    a. 11/2016 LE cyanosis and weak pulses-->CTA w/o significant Ao-BiFem dzs. ? distal dzs-->ASA/Plavix initiated by vascular surgery.  Marland Kitchen PSVT (paroxysmal supraventricular tachycardia) ([place])    a. Dx 11/2016.  . Pulmonary embolism ([place])    a. 12/2016 CTA Chest: small nonocclusive PE in inferior segment of the Left lingula, somewhat eccentric filling defect suggesting chronic rather than acute embolic event; b. 02/2830 LE U/S:  No DVT; c. 12/2016 Echo: Nl RV fxn, nl PASP.  Marland Kitchen Recurrent Metastatic breast cancer ([place])    a. Dx 2016: Stage II, ER positive, PR positive, HER-2/neu overexpressing of the left breast-->chemo/radiation; b. 10/2016 CT Abd/pelvis: diffuse liver mets, ill defined sclerotic bone lesions-T12;  c. 10/2016 MRI brain: metastatic lesion along L temporal lobe (19x94m) w/ extensive surrounding edema & 530mmidline shift to right.  . Sepsis ([place]  . Sinus tachycardia     PAST SURGICAL HISTORY:  Past Surgical History:  Procedure Laterality Date  . BREAST BIOPSY Left 2016   Positive  . BREAST LUMPECTOMY WITH SENTINEL LYMPH NODE BIOPSY Left 05/23/2015   Procedure: LEFT BREAST WIDE EXCISION WITH AXILLARY DISSECTION, MASTOPLASTY ;  Surgeon: [name], MD;  Location: [facility] ORS;  Service: General;  Laterality: Left;  .  BREAST SURGERY Left 12/18/14   breast biopsy/INVASIVE DUCTAL CARCINOMA OF BREAST, NOTTINGHAM GRADE 2.  Marland Kitchen BREAST SURGERY  05/23/2015.   Wide excision/mastoplasty, axillary dissection. No residual invasive cancer, positive for residual DCIS. 0/2 nodes identified on axillary dissection. (no SLN by technetium or methylene blue)  . CARDIAC CATHETERIZATION    . COLONOSCOPY WITH PROPOFOL N/A 12/10/2016   Procedure: COLONOSCOPY WITH PROPOFOL;  Surgeon: Lucilla Lame, MD;  Location: [facility] ENDOSCOPY;  Service: Endoscopy;  Laterality: N/A;  . COLONOSCOPY WITH PROPOFOL N/A 02/13/2017   Procedure: COLONOSCOPY WITH PROPOFOL;  Surgeon: Lucilla Lame, MD;  [facility];  Service: Endoscopy;  Laterality: N/A;  . ESOPHAGOGASTRODUODENOSCOPY N/A 11/03/2019   Procedure: ESOPHAGOGASTRODUODENOSCOPY (EGD);  Surgeon: Lin Landsman, MD;  Location: [facility] ENDOSCOPY;  Service: Gastroenterology;  Laterality: N/A;  . ESOPHAGOGASTRODUODENOSCOPY (EGD) WITH PROPOFOL N/A 12/08/2016   Procedure: ESOPHAGOGASTRODUODENOSCOPY (EGD) WITH PROPOFOL;  Surgeon: Lucilla Lame, MD;  Location: [facility] ENDOSCOPY;  Service: Endoscopy;  Laterality: N/A;  . IVC FILTER INSERTION N/A 02/15/2017   Procedure: IVC Filter Insertion;  Surgeon: Algernon Huxley, MD;  Location: [place] CV LAB;  Service: Cardiovascular;  Laterality: N/A;  . PORTACATH PLACEMENT Right 12-31-14   Dr Bary Castilla    HEMATOLOGY/ONCOLOGY HISTORY:  Oncology History Overview Note  # LEFT BREAST IDC; STAGE II [cT2N1] ER >90%; PR- 50-90%; her 2 NEU- POS; s/p Neoadj chemo; AUG 2016- TCH+P s/p Lumpec & partial ALND- path CR; s/p RT [finished Nov 2016];  adj Herceptin; HELD for Jan 19th 2017 [in DEC 2016-EF dropped from 63 to 51%; FEB 27th EF-42.9%]; JAN 2016 START Arimidex; May 2017- EF- 60%; May 24th 2017-Re-start Herceptin q 3W; July 28th STOP herceptin [finished 34m/m2; sec to Low EF; however 2017 Oct EF= 67%; improved]  # DEC 4th 2017- Start Neratinib 4 pills; DEC 11th 3 pills;  STOPPED.  # FEB 2018- RECURRENCE ER/PR positive; Her 2 NEGATIVE [liver Bx]  # MARCH 1st 2018- Tax-Cytoxan [poor tol]  # FEB 2018- Brain mets [SBRT; Dr.Crystal; finished Feb 28th 2018]  # March 2018- Eribulin- poor tolerance  # June 8th 2018- Started Faslodex + Abema [abema-multiple interruptions sec to neutropenia]  # MRI Brain- March 2019- Leptomeningeal mets [WBRT; No LP s/p WBRT- 01/09/2018]; STOP Abema [521mday-sec to severe cytopenia]+faslodex  # MAY 20tth 019- Start Xeloda; STOPPED in Nov 22nd 2019- sec to Progressive liver lesions on CT scan  # NOV 26th 2019- Faslodex + Piqaray 25043m; Jan 2020- Piqray 300 mg+ faslodex; FEB 17th CT scan- mixed response/ Overall progression; STOP Piqray + faslodex.   # FEB 25th 2020- ERIBULIN; Poor tolerance sec to severe neutropenia.  Stopped May 2020.  CT scan May 2020-new/progressive 2 cm liver lesion;   #July nd 2020-Ixempra x5 cycles-; NOV 2020-MRI liver- worsening liver lesions [rising AST/ALT].   # NOV [id number] weekly [consent-]; December 31-2020-progression/rising LFTs.  Stop Taxol  #January 15th, 2021-Adriamycin weekly [C]; MARCH 2021- Partial response. STOP ADRIAMYCIN sec to EF drop 40-45%.   # April 2nd-2021-gemcitabine; 01/15/2020-GEM [800 mg/m2; #3 cycle 1000m27m] q 2 W; with onpro.   #May 06, 2019-MRI brain 5 new lesions [3 mm to 10 mm]-asymptomatic; [previous whole brain radiation]- OCT  2020- Re-Irradiation.   # PN- G-2; may 2017- Cymbalta 19m/d;MARCH 2018-  acute vascular insuff of BIL LE [? Taxotere vasoconstriction on asprin]; # hemorragic shock LGIB [s/p colo; Dr.Wohl]  #  SVT- on flecainide.   # April 2016- Liver Bx- NEG;   # Drop in EF from Herceptin [recovered OCt 27th- EF 65%]; March 2020 ejection fraction 40 to 45%.  Stop Adriamycin.  # BMD- jan 2017- osteopenia; #Gait instability/visual disturbance-[Dr.Shah]- S/p EEG [5/13]- negative for seizures.  # 2020- SEP-s/p Pallaitive care  evaluation  -----------------------------------------------------------     MOLECULAR TESTING- [facility]- Sep 4th 2018- PDL-1 0%/NEG for BRCA; MSI-Stable; Positive for PI3K; ccnd-1; FGF** -----------------------------------------------------------  Dx: Metastatic Breast cancer [ER/PR-Pos; her -2 NEG] Stage IV; goals: palliative Current treatment-gemcitabine chemotherapy [C]   Carcinoma of upper-outer quadrant of left breast in female, estrogen receptor positive ([place]  11/22/2018 - 01/17/2019 Chemotherapy   The patient had eriBULin mesylate (HALAVEN) 2.1 mg in sodium chloride 0.9 % 100 mL chemo infusion, 1.2 mg/m2 = 2.1 mg (100 % of original dose 1.2 mg/m2), Intravenous,  Once, 3 of 4 cycles Dose modification: 1.2 mg/m2 (original dose 1.2 mg/m2, Cycle 1, Reason: Provider Judgment), 0.8 mg/m2 (original dose 1.2 mg/m2, Cycle 3, Reason: Provider Judgment) Administration: 2.1 mg (11/22/2018), 2.1 mg (11/29/2018), 2.1 mg (12/20/2018), 1.4 mg (01/17/2019)  for chemotherapy treatment.    03/30/2019 - 07/12/2019 Chemotherapy   The patient had pegfilgrastim (NEULASTA ONPRO KIT) injection 6 mg, 6 mg, Subcutaneous, Once, 5 of 8 cycles Administration: 6 mg (03/30/2019), 6 mg (04/20/2019), 6 mg (05/11/2019), 6 mg (06/22/2019), 6 mg (06/01/2019) ixabepilone (IXEMPRA) 60 mg in lactated ringers 250 mL chemo infusion, 33.5 mg/m2 = 57 mg (100 % of original dose 32 mg/m2), Intravenous,  Once, 5 of 8 cycles Dose modification: 32 mg/m2 (original dose 32 mg/m2, Cycle 1, Reason: Provider Judgment) Administration: 72 mg (04/20/2019), 72 mg (05/11/2019), 72 mg (06/22/2019), 72 mg (06/01/2019)  for chemotherapy treatment.    08/18/2019 - 09/28/2019 Chemotherapy   The patient had PACLitaxel (TAXOL) 150 mg in sodium chloride 0.9 % 250 mL chemo infusion (</= 822mm2), 80 mg/m2 = 150 mg, Intravenous,  Once, 1 of 4 cycles Administration: 150 mg (08/18/2019), 150 mg (09/08/2019), 150 mg (09/15/2019)  for chemotherapy treatment.     10/13/2019 - 11/30/2019 Chemotherapy   The patient had DOXOrubicin (ADRIAMYCIN) chemo injection 38 mg, 20 mg/m2 = 38 mg, Intravenous,  Once, 6 of 9 cycles Administration: 38 mg (10/13/2019), 38 mg (10/20/2019), 38 mg (10/27/2019), 38 mg (11/17/2019), 38 mg (11/24/2019), 38 mg (11/10/2019) palonosetron (ALOXI) injection 0.25 mg, 0.25 mg, Intravenous,  Once, 6 of 9 cycles Administration: 0.25 mg (10/13/2019), 0.25 mg (10/20/2019), 0.25 mg (10/27/2019), 0.25 mg (11/17/2019), 0.25 mg (11/24/2019), 0.25 mg (11/10/2019)  for chemotherapy treatment.    12/29/2019 -  Chemotherapy   The patient had pegfilgrastim (NEULASTA ONPRO KIT) injection 6 mg, 6 mg, Subcutaneous, Once, 8 of 10 cycles Administration: 6 mg (01/15/2020), 6 mg (01/29/2020), 6 mg (02/12/2020), 6 mg (02/28/2020), 6 mg (03/14/2020), 6 mg (03/28/2020), 6 mg (04/11/2020), 6 mg (04/25/2020) gemcitabine (GEMZAR) 1,444 mg in sodium chloride 0.9 % 250 mL chemo infusion, 800 mg/m2 = 1,444 mg, Intravenous,  Once, 9 of 11 cycles Dose modification: 1,000 mg/m2 (original dose 800 mg/m2, Cycle 3, Reason: Provider Judgment) Administration: 1,444 mg (12/29/2019), 1,444 mg (01/15/2020), 1,800 mg (01/29/2020), 1,800 mg (02/12/2020), 1,800 mg (02/28/2020), 1,800 mg (03/14/2020), 1,800 mg (03/28/2020), 1,800 mg (04/11/2020), 1,800 mg (04/25/2020)  for chemotherapy treatment.  ALLERGIES:  is allergic to no known allergies.  MEDICATIONS:  Current Outpatient Medications  Medication Sig Dispense Refill  . acetaminophen (TYLENOL) 650 MG CR tablet Take 650 mg by mouth every 8 (eight) hours as needed for pain.    Marland Kitchen apixaban (ELIQUIS) 5 MG TABS tablet Take 1 tablet (5 mg total) by mouth 2 (two) times daily. 60 tablet 4  . flecainide (TAMBOCOR) 50 MG tablet Take 1 tablet (50 mg total) by mouth 2 (two) times daily. 60 tablet 4  . levETIRAcetam (KEPPRA) 250 MG tablet Take 1 tablet by mouth 2 (two) times daily.     . magnesium oxide (MAG-OX) 400 MG tablet TAKE 1 TABLET BY MOUTH TWICE A DAY 60  tablet 4  . meclizine (ANTIVERT) 25 MG tablet Take 1 tablet (25 mg total) by mouth 3 (three) times daily as needed for dizziness. 30 tablet 0  . metoprolol tartrate (LOPRESSOR) 25 MG tablet TAKE 1 TABLET BY MOUTH TWICE A DAY 60 tablet 1  . montelukast (SINGULAIR) 10 MG tablet Take 1 tablet (10 mg total) by mouth at bedtime. 60 tablet 3  . ondansetron (ZOFRAN) 8 MG tablet One pill every 8 hours as needed for nausea/vomitting. 40 tablet 1  . pregabalin (LYRICA) 100 MG capsule TAKE 1 CAPSULE BY MOUTH TWICE A DAY 60 capsule 2  . prochlorperazine (COMPAZINE) 10 MG tablet Take 1 tablet (10 mg total) by mouth every 6 (six) hours as needed for nausea or vomiting. 40 tablet 1  . Vitamin D, Ergocalciferol, (DRISDOL) 1.25 MG (50000 UNIT) CAPS capsule TAKE 1 CAPSULE (50,000 UNITS TOTAL) ONCE A WEEK BY MOUTH. 12 capsule 1   No current facility-administered medications for this visit.   Facility-Administered Medications Ordered in Other Visits  Medication Dose Route Frequency Provider Last Rate Last Admin  . 0.9 %  sodium chloride infusion   Intravenous Continuous [name], MD   New Bag at 12/10/16 1140  . 0.9 %  sodium chloride infusion   Intravenous Continuous Lloyd Huger, MD 999 mL/hr at 04/24/15 1520 New Bag at 05/23/15 0951  . 0.9 %  sodium chloride infusion   Intravenous Continuous Verlon Au, NP 999 mL/hr at 10/31/19 1522 New Bag at 10/31/19 1522  . heparin lock flush 100 unit/mL  500 Units Intravenous Once Berenzon, Dmitriy, MD      . sodium chloride 0.9 % injection 10 mL  10 mL Intravenous PRN [name], MD   10 mL at 04/04/15 1440  . sodium chloride flush (NS) 0.9 % injection 10 mL  10 mL Intravenous PRN Berenzon, Dmitriy, MD      . sodium chloride flush (NS) 0.9 % injection 10 mL  10 mL Intravenous PRN Cammie Sickle, MD   10 mL at 04/25/20 0910    VITAL SIGNS: There were no vitals taken for this visit. There were no vitals filed for this visit.  Estimated body  mass index is 23.18 kg/m as calculated from the following:   Height as of an earlier encounter on 04/25/20: 5' 6"  (1.676 m).   Weight as of an earlier encounter on 04/25/20: 143 lb 9.6 oz (65.1 kg).  LABS: CBC:    Component Value Date/Time   WBC 6.3 04/25/2020 0856   HGB 13.7 04/25/2020 0856   HGB 10.2 (L) 03/04/2017 0918   HCT 40.3 04/25/2020 0856   HCT 33.3 (L) 03/04/2017 0918   PLT 141 (L) 04/25/2020 0856   PLT 225 03/04/2017 0918   MCV 96.6 04/25/2020  0856   MCV 89 03/04/2017 0918   MCV 90 01/22/2015 1559   NEUTROABS 4.8 04/25/2020 0856   NEUTROABS 3.1 03/04/2017 0918   NEUTROABS 34.8 (H) 01/22/2015 1559   LYMPHSABS 0.7 04/25/2020 0856   LYMPHSABS 0.7 03/04/2017 0918   LYMPHSABS 3.4 01/22/2015 1559   MONOABS 0.6 04/25/2020 0856   MONOABS 1.0 (H) 01/22/2015 1559   EOSABS 0.1 04/25/2020 0856   EOSABS 0.1 03/04/2017 0918   EOSABS 0.0 01/22/2015 1559   BASOSABS 0.0 04/25/2020 0856   BASOSABS 0.0 03/04/2017 0918   BASOSABS 0.0 01/22/2015 1559   Comprehensive Metabolic Panel:    Component Value Date/Time   NA 141 04/25/2020 0856   NA 135 03/04/2017 0918   NA 135 01/08/2015 0850   K 3.8 04/25/2020 0856   K 3.7 01/08/2015 0850   CL 103 04/25/2020 0856   CL 101 01/08/2015 0850   CO2 28 04/25/2020 0856   CO2 26 01/08/2015 0850   BUN 17 04/25/2020 0856   BUN 8 03/04/2017 0918   BUN 17 01/08/2015 0850   CREATININE 0.71 04/25/2020 0856   CREATININE 0.82 01/22/2015 1559   GLUCOSE 84 04/25/2020 0856   GLUCOSE 161 (H) 01/08/2015 0850   CALCIUM 9.0 04/25/2020 0856   CALCIUM 9.3 01/08/2015 0850   AST 48 (H) 04/25/2020 0856   AST 34 01/22/2015 1559   ALT 42 04/25/2020 0856   ALT 42 01/22/2015 1559   ALKPHOS 380 (H) 04/25/2020 0856   ALKPHOS 157 (H) 01/22/2015 1559   BILITOT 1.0 04/25/2020 0856   BILITOT 6.2 (H) 03/04/2017 0918   BILITOT 0.2 (L) 01/22/2015 1559   PROT 7.0 04/25/2020 0856   PROT 6.4 03/04/2017 0918   PROT 6.8 01/22/2015 1559   ALBUMIN 3.9 04/25/2020  0856   ALBUMIN 2.5 (L) 03/04/2017 0918   ALBUMIN 3.9 01/22/2015 1559    RADIOGRAPHIC STUDIES: No results found.  PERFORMANCE STATUS (ECOG) : 1 - Symptomatic but completely ambulatory  Review of Systems Unless otherwise noted, a complete review of systems is negative.  Physical Exam General: NAD Pulmonary: Unlabored Extremities: no edema, no joint deformities Skin: no rashes Neurological: Weakness but otherwise nonfocal  IMPRESSION: Met with patient infusion area.  She reports that she is doing well.  She denies any significant changes or concerns.  No symptomatic complaints today.  Patient appears to be tolerating her treatments well.  She reports good oral intake and stable performance status.  Weight appears stable at 143 pounds.  PLAN: -Continue current scope of treatment -Follow-up MyChart visit 1 to 2 months   Patient expressed understanding and was in agreement with this plan. She also understands that She can call the clinic at any time with any questions, concerns, or complaints.     Time Total: 15 minutes  Visit consisted of counseling and education dealing with the complex and emotionally intense issues of symptom management and palliative care in the setting of serious and potentially life-threatening illness.Greater than 50%  of this time was spent counseling and coordinating care related to the above assessment and plan.  Signed by: Altha Harm, PhD, NP-C

## 2020-04-25 NOTE — Progress Notes (Signed)
Nutrition  Complimentary case of ensure enlive given to patient today.   Reiley Bertagnolli B. Zenia Resides, [place], [place] Registered Dietitian [phone] (mobile)

## 2020-04-25 NOTE — Assessment & Plan Note (Addendum)
#  Recurrent metastatic breast cancer-lung/liver metastases ER PR positive HER-2 negative; on Gemcitabine; June 29th CT improvement of LUL ~2.9 cm; Liver lesions- smaller. STABLE.   # proceed with gemcitabine #9 every 2 weeks[sec to neutropenia/on pro].  Labs today reviewed;  acceptable for treatment today.   #Gait instability/visual disturbance-[Dr.Shah]-clinically-S/p EEG [5/13]- negative for seizures; on keppra- STABLE.   # cardiomyopathy/ Sinus tachycardia- on metoprolol-/Tamabcor; [march 2d-echo- 40-45%]; STABLE.   # Elevated LFTs-likely secondary underlying cirrhosis/ chemo/underlying malignancy-  STABLE; monitor for now.    # Peripheral neuropathy grade 1-2-  on Lyrica- STABLE.  # weight loss/ poor appetite; sec to malignancy/ chemo- STABLE.   # Right lower extremity DVT bilateral [November 28, 2019] CT scan abdomen pelvis negative for any DVT.  On eliquis; STABLE. ok with cataract surgery- recommend call prior to surgery; needs to have eliquis held 48-72 hours prior.   # DISPOSITION:  # gemcitabine/onprpo today;  # 2 weeks MD; labs- cbc/cmp;GEM- onpro; -Dr.B

## 2020-04-25 NOTE — Progress Notes (Signed)
[place] OFFICE PROGRESS NOTE  Patient Care Team: Delsa Grana, PA-C as PCP - General (Family Medicine) Wellington Hampshire, MD as PCP - Cardiology (Cardiology) Bary Castilla, Forest Gleason, MD (General Surgery) Vladimir Crofts, MD as Consulting Physician (Neurology) Cammie Sickle, MD as Consulting Physician (Internal Medicine)  Cancer Staging No matching staging information was found for the patient.   Oncology History Overview Note  # LEFT BREAST IDC; STAGE II [cT2N1] ER >90%; PR- 50-90%; her 2 NEU- POS; s/p Neoadj chemo; AUG 2016- TCH+P s/p Lumpec & partial ALND- path CR; s/p RT [finished Nov 2016];  adj Herceptin; HELD for Jan 19th 2017 [in DEC 2016-EF dropped from 63 to 51%; FEB 27th EF-42.9%]; JAN 2016 START Arimidex; May 2017- EF- 60%; May 24th 2017-Re-start Herceptin q 3W; July 28th STOP herceptin [finished 12m/m2; sec to Low EF; however 2017 Oct EF= 67%; improved]  # DEC 4th 2017- Start Neratinib 4 pills; DEC 11th 3 pills; STOPPED.  # FEB 2018- RECURRENCE ER/PR positive; Her 2 NEGATIVE [liver Bx]  # MARCH 1st 2018- Tax-Cytoxan [poor tol]  # FEB 2018- Brain mets [SBRT; Dr.Crystal; finished Feb 28th 2018]  # March 2018- Eribulin- poor tolerance  # June 8th 2018- Started Faslodex + Abema [abema-multiple interruptions sec to neutropenia]  # MRI Brain- March 2019- Leptomeningeal mets [WBRT; No LP s/p WBRT- 01/09/2018]; STOP Abema [577mday-sec to severe cytopenia]+faslodex  # MAY 20tth 019- Start Xeloda; STOPPED in Nov 22nd 2019- sec to Progressive liver lesions on CT scan  # NOV 26th 2019- Faslodex + Piqaray 25058m; Jan 2020- Piqray 300 mg+ faslodex; FEB 17th CT scan- mixed response/ Overall progression; STOP Piqray + faslodex.   # FEB 25th 2020- ERIBULIN; Poor tolerance sec to severe neutropenia.  Stopped May 2020.  CT scan May 2020-new/progressive 2 cm liver lesion;   #July nd 2020-Ixempra x5 cycles-; NOV 2020-MRI liver- worsening liver lesions [rising  AST/ALT].   # NOV [id number] weekly [consent-]; December 31-2020-progression/rising LFTs.  Stop Taxol  #January 15th, 2021-Adriamycin weekly [C]; MARCH 2021- Partial response. STOP ADRIAMYCIN sec to EF drop 40-45%.   # April 2nd-2021-gemcitabine; 01/15/2020-GEM [800 mg/m2; #3 cycle 1000m7m] q 2 W; with onpro.   #May 06, 2019-MRI brain 5 new lesions [3 mm to 10 mm]-asymptomatic; [previous whole brain radiation]- OCT 2020- Re-Irradiation.   # PN- G-2; may 2017- Cymbalta 60mg32mARCH 2018-  acute vascular insuff of BIL LE [? Taxotere vasoconstriction on asprin]; # hemorragic shock LGIB [s/p colo; Dr.Wohl]  #  SVT- on flecainide.   # April 2016- Liver Bx- NEG;   # Drop in EF from Herceptin [recovered OCt 27th- EF 65%]; March 2020 ejection fraction 40 to 45%.  Stop Adriamycin.  # BMD- jan 2017- osteopenia; #Gait instability/visual disturbance-[Dr.Shah]- S/p EEG [5/13]- negative for seizures.  # 2020- SEP-s/p Pallaitive care evaluation  -----------------------------------------------------------     MOLECULAR TESTING- [facility]- Sep 4th 2018- PDL-1 0%/NEG for BRCA; MSI-Stable; Positive for PI3K; ccnd-1; FGF** -----------------------------------------------------------  Dx: Metastatic Breast cancer [ER/PR-Pos; her -2 NEG] Stage IV; goals: palliative Current treatment-gemcitabine chemotherapy [C]   Carcinoma of upper-outer quadrant of left breast in female, estrogen receptor positive (HCC) [place]25/2020 - 01/17/2019 Chemotherapy   The patient had eriBULin mesylate (HALAVEN) 2.1 mg in sodium chloride 0.9 % 100 mL chemo infusion, 1.2 mg/m2 = 2.1 mg (100 % of original dose 1.2 mg/m2), Intravenous,  Once, 3 of 4 cycles Dose modification: 1.2 mg/m2 (original dose 1.2 mg/m2, Cycle 1, Reason: Provider Judgment), 0.8 mg/m2 (original  dose 1.2 mg/m2, Cycle 3, Reason: Provider Judgment) Administration: 2.1 mg (11/22/2018), 2.1 mg (11/29/2018), 2.1 mg (12/20/2018), 1.4 mg (01/17/2019)  for  chemotherapy treatment.    03/30/2019 - 07/12/2019 Chemotherapy   The patient had pegfilgrastim (NEULASTA ONPRO KIT) injection 6 mg, 6 mg, Subcutaneous, Once, 5 of 8 cycles Administration: 6 mg (03/30/2019), 6 mg (04/20/2019), 6 mg (05/11/2019), 6 mg (06/22/2019), 6 mg (06/01/2019) ixabepilone (IXEMPRA) 60 mg in lactated ringers 250 mL chemo infusion, 33.5 mg/m2 = 57 mg (100 % of original dose 32 mg/m2), Intravenous,  Once, 5 of 8 cycles Dose modification: 32 mg/m2 (original dose 32 mg/m2, Cycle 1, Reason: Provider Judgment) Administration: 72 mg (04/20/2019), 72 mg (05/11/2019), 72 mg (06/22/2019), 72 mg (06/01/2019)  for chemotherapy treatment.    08/18/2019 - 09/28/2019 Chemotherapy   The patient had PACLitaxel (TAXOL) 150 mg in sodium chloride 0.9 % 250 mL chemo infusion (</= '80mg'$ /m2), 80 mg/m2 = 150 mg, Intravenous,  Once, 1 of 4 cycles Administration: 150 mg (08/18/2019), 150 mg (09/08/2019), 150 mg (09/15/2019)  for chemotherapy treatment.    10/13/2019 - 11/30/2019 Chemotherapy   The patient had DOXOrubicin (ADRIAMYCIN) chemo injection 38 mg, 20 mg/m2 = 38 mg, Intravenous,  Once, 6 of 9 cycles Administration: 38 mg (10/13/2019), 38 mg (10/20/2019), 38 mg (10/27/2019), 38 mg (11/17/2019), 38 mg (11/24/2019), 38 mg (11/10/2019) palonosetron (ALOXI) injection 0.25 mg, 0.25 mg, Intravenous,  Once, 6 of 9 cycles Administration: 0.25 mg (10/13/2019), 0.25 mg (10/20/2019), 0.25 mg (10/27/2019), 0.25 mg (11/17/2019), 0.25 mg (11/24/2019), 0.25 mg (11/10/2019)  for chemotherapy treatment.    12/29/2019 -  Chemotherapy   The patient had pegfilgrastim (NEULASTA ONPRO KIT) injection 6 mg, 6 mg, Subcutaneous, Once, 8 of 10 cycles Administration: 6 mg (01/15/2020), 6 mg (01/29/2020), 6 mg (02/12/2020), 6 mg (02/28/2020), 6 mg (03/14/2020), 6 mg (03/28/2020), 6 mg (04/11/2020), 6 mg (04/25/2020) gemcitabine (GEMZAR) 1,444 mg in sodium chloride 0.9 % 250 mL chemo infusion, 800 mg/m2 = 1,444 mg, Intravenous,  Once, 9 of 11 cycles Dose  modification: 1,000 mg/m2 (original dose 800 mg/m2, Cycle 3, Reason: Provider Judgment) Administration: 1,444 mg (12/29/2019), 1,444 mg (01/15/2020), 1,800 mg (01/29/2020), 1,800 mg (02/12/2020), 1,800 mg (02/28/2020), 1,800 mg (03/14/2020), 1,800 mg (03/28/2020), 1,800 mg (04/11/2020), 1,800 mg (04/25/2020)  for chemotherapy treatment.      INTERVAL HISTORY:  Sarah Horne 64 y.o.  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  No nausea no vomiting.  Mild fatigue.  No chest pain or shortness of breath.  No headaches.  No falls.   Review of Systems  Constitutional: Positive for malaise/fatigue and weight loss. Negative for chills, diaphoresis and fever.  HENT: Negative for nosebleeds and sore throat.   Eyes: Negative for double vision.  Respiratory: Negative for cough, hemoptysis, sputum production, shortness of breath and wheezing.   Cardiovascular: Negative for chest pain, palpitations, orthopnea and leg swelling.  Gastrointestinal: Positive for constipation. Negative for abdominal pain, blood in stool, heartburn, melena, nausea and vomiting.  Genitourinary: Negative for dysuria, frequency and urgency.  Musculoskeletal: Positive for joint pain. Negative for back pain.  Skin: Negative for itching.  Neurological: Positive for dizziness and tingling. Negative for focal weakness, weakness and headaches.  Endo/Heme/Allergies: Does not bruise/bleed easily.  Psychiatric/Behavioral: Negative for depression. The patient is not nervous/anxious and does not have insomnia.     PAST MEDICAL HISTORY :  Past Medical History:  Diagnosis Date  .  Chemotherapy-induced peripheral neuropathy ([place])   . Epistaxis    a. 11/2016 in setting of asa/plavix-->silver nitrate cauterization.  . GI bleed    a. 11/2016 Admission w/ GIB and hypovolemic shock req 3u PRBC's;  b. 11/2016 ECG: gastritis  & nonbleeding peptic ulcer; c. 11/2016 Conlonoscopy: rectal and sigmoid colonic ulcers.  . Heart attack ([place])    a. 1998 Cath @ [facility]: reportedly no intervention required.  Marland Kitchen Herceptin-induced cardiomyopathy ([place])    a. In the setting of Herceptin Rx for breast cancer (initiated 12/2014); b. 03/2015 MUGA EF 64%; b. 08/2015 MUGA: EF 51%; c. 10/2015 MUGA: EF 44%; d. 11/2015 Echo: EF 45-50%; e. 01/2016 MUGA: EF 60%; f. 06/2016 MUGA EF 65%; g. 10/2016 MUGA: EF 61%;  h. 12/2016 Echo: EF 55-60%, gr1 DD.  Marland Kitchen Hyperlipidemia   . Hypertension   . Neuropathy   . Possible PAD (peripheral artery disease) ([place])    a. 11/2016 LE cyanosis and weak pulses-->CTA w/o significant Ao-BiFem dzs. ? distal dzs-->ASA/Plavix initiated by vascular surgery.  Marland Kitchen PSVT (paroxysmal supraventricular tachycardia) ([place])    a. Dx 11/2016.  . Pulmonary embolism ([place])    a. 12/2016 CTA Chest: small nonocclusive PE in inferior segment of the Left lingula, somewhat eccentric filling defect suggesting chronic rather than acute embolic event; b. 11/5463 LE U/S:  No DVT; c. 12/2016 Echo: Nl RV fxn, nl PASP.  Marland Kitchen Recurrent Metastatic breast cancer ([place])    a. Dx 2016: Stage II, ER positive, PR positive, HER-2/neu overexpressing of the left breast-->chemo/radiation; b. 10/2016 CT Abd/pelvis: diffuse liver mets, ill defined sclerotic bone lesions-T12;  c. 10/2016 MRI brain: metastatic lesion along L temporal lobe (19x38m) w/ extensive surrounding edema & 5101mmidline shift to right.  . Sepsis ([place]  . Sinus tachycardia     PAST SURGICAL HISTORY :   Past Surgical History:  Procedure Laterality Date  . BREAST BIOPSY Left 2016   Positive  . BREAST LUMPECTOMY WITH SENTINEL LYMPH NODE BIOPSY Left 05/23/2015   Procedure: LEFT BREAST WIDE EXCISION WITH AXILLARY DISSECTION, MASTOPLASTY ;  Surgeon: JeRobert BellowMD;  Location: [facility];  Service: General;  Laterality: Left;  . BREAST SURGERY Left 12/18/14   breast biopsy/INVASIVE DUCTAL CARCINOMA OF BREAST,  [place] GRADE 2.  . Marland KitchenREAST SURGERY  05/23/2015.   Wide excision/mastoplasty, axillary dissection. No residual invasive cancer, positive for residual DCIS. 0/2 nodes identified on axillary dissection. (no SLN by technetium or methylene blue)  . CARDIAC CATHETERIZATION    . COLONOSCOPY WITH PROPOFOL N/A 12/10/2016   Procedure: COLONOSCOPY WITH PROPOFOL;  Surgeon: DaLucilla LameMD;  Location: [facility] END[facility]COPY;  Service: Endoscopy;  Laterality: N/A;  . COLONOSCOPY WITH PROPOFOL N/A 02/13/2017   Procedure: COLONOSCOPY WITH PROPOFOL;  Surgeon: WoLucilla LameMD;  [facility];  Service: Endoscopy;  Laterality: N/A;  . ESOPHAGOGASTRODUODENOSCOPY N/A 11/03/2019   Procedure: ESOPHAGOGASTRODUODENOSCOPY (EGD);  Surgeon: VaLin LandsmanMD;  [facility];  Service: Gastroenterology;  Laterality: N/A;  . ESOPHAGOGASTRODUODENOSCOPY (EGD) WITH PROPOFOL N/A 12/08/2016   Procedure: ESOPHAGOGASTRODUODENOSCOPY (EGD) WITH PROPOFOL;  Surgeon: DaLucilla LameMD;  Location: [facility];  Service: Endoscopy;  Laterality: N/A;  . IVC FILTER INSERTION N/A 02/15/2017   Procedure: IVC Filter Insertion;  Surgeon: DeAlgernon HuxleyMD;  [facility];  Service: Cardiovascular;  Laterality: N/A;  . PORTACATH PLACEMENT Right 12-31-14   Dr ByBary Castilla  FAMILY HISTORY :   Family History  Problem Relation Age of Onset  . Breast cancer  Maternal Aunt   . Breast cancer Cousin   . Brain cancer Maternal Uncle   . Diabetes Mother   . Hypertension Mother   . Stroke Mother     SOCIAL HISTORY:   Social History   Tobacco Use  . Smoking status: Light Tobacco Smoker    Packs/day: 0.25    Years: 18.00    Pack years: 4.50    Types: Cigarettes  . Smokeless tobacco: Never Used  Vaping Use  . Vaping Use: Never used  Substance Use Topics  . Alcohol use: No    Alcohol/week: 0.0 standard drinks  . Drug use: No    ALLERGIES:  is allergic to no known allergies.  MEDICATIONS:  Current  Outpatient Medications  Medication Sig Dispense Refill  . acetaminophen (TYLENOL) 650 MG CR tablet Take 650 mg by mouth every 8 (eight) hours as needed for pain.    Marland Kitchen apixaban (ELIQUIS) 5 MG TABS tablet Take 1 tablet (5 mg total) by mouth 2 (two) times daily. 60 tablet 4  . flecainide (TAMBOCOR) 50 MG tablet Take 1 tablet (50 mg total) by mouth 2 (two) times daily. 60 tablet 4  . levETIRAcetam (KEPPRA) 250 MG tablet Take 1 tablet by mouth 2 (two) times daily.     . magnesium oxide (MAG-OX) 400 MG tablet TAKE 1 TABLET BY MOUTH TWICE A DAY 60 tablet 4  . meclizine (ANTIVERT) 25 MG tablet Take 1 tablet (25 mg total) by mouth 3 (three) times daily as needed for dizziness. 30 tablet 0  . metoprolol tartrate (LOPRESSOR) 25 MG tablet TAKE 1 TABLET BY MOUTH TWICE A DAY 60 tablet 1  . montelukast (SINGULAIR) 10 MG tablet Take 1 tablet (10 mg total) by mouth at bedtime. 60 tablet 3  . ondansetron (ZOFRAN) 8 MG tablet One pill every 8 hours as needed for nausea/vomitting. 40 tablet 1  . pregabalin (LYRICA) 100 MG capsule TAKE 1 CAPSULE BY MOUTH TWICE A DAY 60 capsule 2  . prochlorperazine (COMPAZINE) 10 MG tablet Take 1 tablet (10 mg total) by mouth every 6 (six) hours as needed for nausea or vomiting. 40 tablet 1  . Vitamin D, Ergocalciferol, (DRISDOL) 1.25 MG (50000 UNIT) CAPS capsule TAKE 1 CAPSULE (50,000 UNITS TOTAL) ONCE A WEEK BY MOUTH. 12 capsule 1   No current facility-administered medications for this visit.   Facility-Administered Medications Ordered in Other Visits  Medication Dose Route Frequency Provider Last Rate Last Admin  . 0.9 %  sodium chloride infusion   Intravenous Continuous [name], MD   New Bag at 12/10/16 1140  . 0.9 %  sodium chloride infusion   Intravenous Continuous Lloyd Huger, MD 999 mL/hr at 04/24/15 1520 New Bag at 05/23/15 0951  . 0.9 %  sodium chloride infusion   Intravenous Continuous Verlon Au, NP 999 mL/hr at 10/31/19 1522 New Bag at 10/31/19 1522   . heparin lock flush 100 unit/mL  500 Units Intravenous Once Berenzon, Dmitriy, MD      . sodium chloride 0.9 % injection 10 mL  10 mL Intravenous PRN [name], MD   10 mL at 04/04/15 1440  . sodium chloride flush (NS) 0.9 % injection 10 mL  10 mL Intravenous PRN Berenzon, Dmitriy, MD        PHYSICAL EXAMINATION: ECOG PERFORMANCE STATUS: 1 - Symptomatic but completely ambulatory  BP 115/81 (BP Location: Right Arm, Patient Position: Sitting, Cuff Size: Normal)   Pulse 82   Temp (!) 97.5 F (36.4 C) (  Tympanic)   Resp 16   Ht 5\' 6"  (1.676 m)   Wt 143 lb 9.6 oz (65.1 kg)   SpO2 100%   BMI 23.18 kg/m   Filed Weights   04/25/20 0939  Weight: 143 lb 9.6 oz (65.1 kg)    Physical Exam Constitutional:      Comments: Patient is alone.  In wheel chair.   HENT:     Head: Normocephalic and atraumatic.     Mouth/Throat:     Pharynx: No oropharyngeal exudate.  Eyes:     Pupils: Pupils are equal, round, and reactive to light.  Cardiovascular:     Rate and Rhythm: Normal rate and regular rhythm.  Pulmonary:     Effort: Pulmonary effort is normal. No respiratory distress.     Breath sounds: Normal breath sounds. No wheezing.  Abdominal:     General: Bowel sounds are normal. There is no distension.     Palpations: Abdomen is soft. There is no mass.     Tenderness: There is no abdominal tenderness. There is no guarding or rebound.  Musculoskeletal:        General: No tenderness. Normal range of motion.     Cervical back: Normal range of motion and neck supple.  Skin:    General: Skin is warm.  Neurological:     Mental Status: She is alert and oriented to person, place, and time.     Comments: Broad based gait.  Unsteady.  Psychiatric:        Mood and Affect: Affect normal.     LABORATORY DATA:  I have reviewed the data as listed    Component Value Date/Time   NA 141 04/25/2020 0856   NA 135 03/04/2017 0918   NA 135 01/08/2015 0850   K 3.8 04/25/2020 0856   K 3.7  01/08/2015 0850   CL 103 04/25/2020 0856   CL 101 01/08/2015 0850   CO2 28 04/25/2020 0856   CO2 26 01/08/2015 0850   GLUCOSE 84 04/25/2020 0856   GLUCOSE 161 (H) 01/08/2015 0850   BUN 17 04/25/2020 0856   BUN 8 03/04/2017 0918   BUN 17 01/08/2015 0850   CREATININE 0.71 04/25/2020 0856   CREATININE 0.82 01/22/2015 1559   CALCIUM 9.0 04/25/2020 0856   CALCIUM 9.3 01/08/2015 0850   PROT 7.0 04/25/2020 0856   PROT 6.4 03/04/2017 0918   PROT 6.8 01/22/2015 1559   ALBUMIN 3.9 04/25/2020 0856   ALBUMIN 2.5 (L) 03/04/2017 0918   ALBUMIN 3.9 01/22/2015 1559   AST 48 (H) 04/25/2020 0856   AST 34 01/22/2015 1559   ALT 42 04/25/2020 0856   ALT 42 01/22/2015 1559   ALKPHOS 380 (H) 04/25/2020 0856   ALKPHOS 157 (H) 01/22/2015 1559   BILITOT 1.0 04/25/2020 0856   BILITOT 6.2 (H) 03/04/2017 0918   BILITOT 0.2 (L) 01/22/2015 1559   GFRNONAA >60 04/25/2020 0856   GFRNONAA >60 01/22/2015 1559   GFRAA >60 04/25/2020 0856   GFRAA >60 01/22/2015 1559    No results found for: SPEP, UPEP  Lab Results  Component Value Date   WBC 6.3 04/25/2020   NEUTROABS 4.8 04/25/2020   HGB 13.7 04/25/2020   HCT 40.3 04/25/2020   MCV 96.6 04/25/2020   PLT 141 (L) 04/25/2020      Chemistry      Component Value Date/Time   NA 141 04/25/2020 0856   NA 135 03/04/2017 0918   NA 135 01/08/2015 0850   K 3.8 04/25/2020  0856   K 3.7 01/08/2015 0850   CL 103 04/25/2020 0856   CL 101 01/08/2015 0850   CO2 28 04/25/2020 0856   CO2 26 01/08/2015 0850   BUN 17 04/25/2020 0856   BUN 8 03/04/2017 0918   BUN 17 01/08/2015 0850   CREATININE 0.71 04/25/2020 0856   CREATININE 0.82 01/22/2015 1559      Component Value Date/Time   CALCIUM 9.0 04/25/2020 0856   CALCIUM 9.3 01/08/2015 0850   ALKPHOS 380 (H) 04/25/2020 0856   ALKPHOS 157 (H) 01/22/2015 1559   AST 48 (H) 04/25/2020 0856   AST 34 01/22/2015 1559   ALT 42 04/25/2020 0856   ALT 42 01/22/2015 1559   BILITOT 1.0 04/25/2020 0856   BILITOT 6.2  (H) 03/04/2017 0918   BILITOT 0.2 (L) 01/22/2015 1559       RADIOGRAPHIC STUDIES: I have personally reviewed the radiological images as listed and agreed with the findings in the report. No results found.   ASSESSMENT & PLAN:  Carcinoma of upper-outer quadrant of left breast in female, estrogen receptor positive ([place]) # Recurrent metastatic breast cancer-lung/liver metastases ER PR positive HER-2 negative; on Gemcitabine; June 29th CT improvement of LUL ~2.9 cm; Liver lesions- smaller. STABLE.   # proceed with gemcitabine #9 every 2 weeks[sec to neutropenia/on pro].  Labs today reviewed;  acceptable for treatment today.   #Gait instability/visual disturbance-[Dr.Shah]-clinically-S/p EEG [5/13]- negative for seizures; on keppra- STABLE.   # cardiomyopathy/ Sinus tachycardia- on metoprolol-/Tamabcor; [march 2d-echo- 40-45%]; STABLE.   # Elevated LFTs-likely secondary underlying cirrhosis/ chemo/underlying malignancy-  STABLE; monitor for now.    # Peripheral neuropathy grade 1-2-  on Lyrica- STABLE.  # weight loss/ poor appetite; sec to malignancy/ chemo- STABLE.   # Right lower extremity DVT bilateral [November 28, 2019] CT scan abdomen pelvis negative for any DVT.  On eliquis; STABLE. ok with cataract surgery- recommend call prior to surgery; needs to have eliquis held 48-72 hours prior.   # DISPOSITION:  # gemcitabine/onprpo today;  # 2 weeks MD; labs- cbc/cmp;GEM- onpro; -Dr.B     No orders of the defined types were placed in this encounter.  All questions were answered. The patient knows to call the clinic with any problems, questions or concerns.     Cammie Sickle, MD 05/03/2020 12:54 PM

## 2020-04-30 ENCOUNTER — Encounter: Payer: Self-pay | Admitting: Internal Medicine

## 2020-04-30 ENCOUNTER — Ambulatory Visit: Payer: Medicare HMO | Admitting: Internal Medicine

## 2020-04-30 ENCOUNTER — Ambulatory Visit (INDEPENDENT_AMBULATORY_CARE_PROVIDER_SITE_OTHER): Payer: Medicare HMO | Admitting: Internal Medicine

## 2020-04-30 ENCOUNTER — Other Ambulatory Visit: Payer: Self-pay

## 2020-04-30 VITALS — BP 122/80 | HR 105 | Temp 98.3°F | Resp 16 | Ht 66.0 in | Wt 138.7 lb

## 2020-04-30 DIAGNOSIS — H6123 Impacted cerumen, bilateral: Secondary | ICD-10-CM

## 2020-04-30 NOTE — Progress Notes (Signed)
Patient ID: Sarah Horne, female DOB: 06/25/1956, 64 y.o. MRN: [id number]  PCP: Delsa Grana, PA-C      Chief Complaint  Patient presents with  . Cerumen Impaction    here to have ears cleaned   Subjective:  Sarah Horne is a 64 y.o. female, presents to clinic with CC of the following:      Chief Complaint  Patient presents with  . Cerumen Impaction    here to have ears cleaned   HPI:  Patient with feeling of ear blockage, had irrigated in the past and helped. Not painful.  Had drops to apply and a bulb type syringe to irrigate, although notes when she tried, nothing seemed to come out. She did apply drops again yesterday morning.  Denies PND, sinus congestion,  no fevers  She does use Q-tips at times.       Patient Active Problem List   Diagnosis Date Noted  . Chemotherapy-induced fatigue 11/02/2019  . Convalescence following chemotherapy 11/02/2019  . Leukopenia due to antineoplastic chemotherapy ([place]) 11/02/2019  . Elevated serum creatinine 11/02/2019  . Acute deep vein thrombosis (DVT) of femoral vein of right lower extremity ([place]) 11/01/2019  . Accidental fall from chair, initial encounter 10/05/2019  . Metastatic breast cancer ([place]) 10/05/2019  . Sepsis ([place]) 10/05/2019  . Tachyarrhythmia 10/05/2019  . Combined forms of age-related cataract of both eyes 12/06/2018  . Nuclear sclerotic cataract of left eye 12/06/2018  . Brain metastasis ([place]) 05/19/2017  . Blood in stool   . Polyp of sigmoid colon   . Benign neoplasm of transverse colon   . Benign neoplasm of ascending colon   . Lower GI bleed   . Weakness of right lower extremity 02/11/2017  . Orthostatic lightheadedness 01/14/2017  . Atrial tachycardia ([place]) 01/14/2017  . Hypokalemia 01/14/2017  . Pulmonary emboli ([place]) 01/12/2017  . Dehydration 12/25/2016  . Blood loss anemia 12/15/2016  . Ulceration of colon   . Heartburn   . Duodenitis   . Gastritis without bleeding   . GI bleed   . Acute  esophagitis   . Acute esophagogastric ulcer   . Rectal bleed   . Hypovolemic shock ([place])   . Ischemic leg 12/04/2016  . Chemotherapy induced neutropenia ([place]) 11/20/2016  . Palliative care by specialist   . Goals of care, counseling/discussion   . DNR (do not resuscitate) discussion   . Cerebral edema ([place]) 11/10/2016  . Encounter for monitoring cardiotoxic drug therapy 11/09/2016  . Elevated LFTs 10/20/2016  . Peripheral neuropathy due to chemotherapy ([place]) 03/13/2016  . Encounter for antineoplastic chemotherapy 03/13/2016  . HLD (hyperlipidemia) 01/29/2016  . Hyperglycemia, unspecified 01/29/2016  . Cardiomyopathy ([place]) 01/13/2016  . Hypertension 07/18/2015  . Carcinoma of upper-outer quadrant of left breast in female, estrogen receptor positive ([place]) 12/27/2014    Current Outpatient Medications:  . acetaminophen (TYLENOL) 650 MG CR tablet, Take 650 mg by mouth every 8 (eight) hours as needed for pain., Disp: , Rfl:  . apixaban (ELIQUIS) 5 MG TABS tablet, Take 1 tablet (5 mg total) by mouth 2 (two) times daily., Disp: 60 tablet, Rfl: 4  . flecainide (TAMBOCOR) 50 MG tablet, Take 1 tablet (50 mg total) by mouth 2 (two) times daily., Disp: 60 tablet, Rfl: 4  . levETIRAcetam (KEPPRA) 250 MG tablet, Take 1 tablet by mouth 2 (two) times daily. , Disp: , Rfl:  . magnesium oxide (MAG-OX) 400 MG tablet, TAKE 1 TABLET BY MOUTH TWICE A DAY, Disp: 60  tablet, Rfl: 4  . meclizine (ANTIVERT) 25 MG tablet, Take 1 tablet (25 mg total) by mouth 3 (three) times daily as needed for dizziness., Disp: 30 tablet, Rfl: 0  . metoprolol tartrate (LOPRESSOR) 25 MG tablet, TAKE 1 TABLET BY MOUTH TWICE A DAY, Disp: 60 tablet, Rfl: 1  . montelukast (SINGULAIR) 10 MG tablet, Take 1 tablet (10 mg total) by mouth at bedtime., Disp: 60 tablet, Rfl: 3  . ondansetron (ZOFRAN) 8 MG tablet, One pill every 8 hours as needed for nausea/vomitting., Disp: 40 tablet, Rfl: 1  . pregabalin (LYRICA) 100 MG capsule, TAKE 1  CAPSULE BY MOUTH TWICE A DAY, Disp: 60 capsule, Rfl: 2  . prochlorperazine (COMPAZINE) 10 MG tablet, Take 1 tablet (10 mg total) by mouth every 6 (six) hours as needed for nausea or vomiting., Disp: 40 tablet, Rfl: 1  . Vitamin D, Ergocalciferol, (DRISDOL) 1.25 MG (50000 UNIT) CAPS capsule, TAKE 1 CAPSULE (50,000 UNITS TOTAL) ONCE A WEEK BY MOUTH., Disp: 12 capsule, Rfl: 1  No current facility-administered medications for this visit.  Facility-Administered Medications Ordered in Other Visits:  . 0.9 % sodium chloride infusion, , Intravenous, Continuous, [name], MD, New Bag at 12/10/16 1140  . 0.9 % sodium chloride infusion, , Intravenous, Continuous, Lloyd Huger, MD, Last Rate: 999 mL/hr at 04/24/15 1520, New Bag at 05/23/15 0951  . 0.9 % sodium chloride infusion, , Intravenous, Continuous, Verlon Au, NP, Last Rate: 999 mL/hr at 10/31/19 1522, New Bag at 10/31/19 1522  . heparin lock flush 100 unit/mL, 500 Units, Intravenous, Once, Berenzon, Dmitriy, MD  . sodium chloride 0.9 % injection 10 mL, 10 mL, Intravenous, PRN, Ma Hillock, Sandeep, MD, 10 mL at 04/04/15 1440  . sodium chloride flush (NS) 0.9 % injection 10 mL, 10 mL, Intravenous, PRN, Berenzon, Dmitriy, MD      Allergies  Allergen Reactions  . No Known Allergies         Past Surgical History:  Procedure Laterality Date  . BREAST BIOPSY Left 2016   Positive  . BREAST LUMPECTOMY WITH SENTINEL LYMPH NODE BIOPSY Left 05/23/2015   Procedure: LEFT BREAST WIDE EXCISION WITH AXILLARY DISSECTION, MASTOPLASTY ; Surgeon: [name], MD; Location: [facility] ORS; Service: General; Laterality: Left;  . BREAST SURGERY Left 12/18/14   breast biopsy/INVASIVE DUCTAL CARCINOMA OF BREAST, NOTTINGHAM GRADE 2.  Marland Kitchen BREAST SURGERY  05/23/2015.   Wide excision/mastoplasty, axillary dissection. No residual invasive cancer, positive for residual DCIS. 0/2 nodes identified on axillary dissection. (no SLN by technetium or methylene blue)  .  CARDIAC CATHETERIZATION    . COLONOSCOPY WITH PROPOFOL N/A 12/10/2016   Procedure: COLONOSCOPY WITH PROPOFOL; Surgeon: Lucilla Lame, MD; Location: [facility] ENDOSCOPY; Service: Endoscopy; Laterality: N/A;  . COLONOSCOPY WITH PROPOFOL N/A 02/13/2017   Procedure: COLONOSCOPY WITH PROPOFOL; Surgeon: Lucilla Lame, MD; Location[facility]; Service: Endoscopy; Laterality: N/A;  . ESOPHAGOGASTRODUODENOSCOPY N/A 11/03/2019   Procedure: ESOPHAGOGASTRODUODENOSCOPY (EGD); Surgeon: Lin Landsman, MD; Location: [facility] ENDOSCOPY; Service: Gastroenterology; Laterality: N/A;  . ESOPHAGOGASTRODUODENOSCOPY (EGD) WITH PROPOFOL N/A 12/08/2016   Procedure: ESOPHAGOGASTRODUODENOSCOPY (EGD) WITH PROPOFOL; Surgeon: Lucilla Lame, MD; Location: [facility] ENDOSCOPY; Service: Endoscopy; Laterality: N/A;  . IVC FILTER INSERTION N/A 02/15/2017   Procedure: IVC Filter Insertion; Surgeon: Algernon Huxley, MD; Location: [place] CV LAB; Service: Cardiovascular; Laterality: N/A;  . PORTACATH PLACEMENT Right 12-31-14   Dr Bary Castilla        Family History  Problem Relation Age of Onset  . Breast cancer Maternal Aunt   . Breast  cancer Cousin   . Brain cancer Maternal Uncle   . Diabetes Mother   . Hypertension Mother   . Stroke Mother    Social History        Tobacco Use  . Smoking status: Light Tobacco Smoker    Packs/day: 0.25    Years: 18.00    Pack years: 4.50    Types: Cigarettes  . Smokeless tobacco: Never Used  Substance Use Topics  . Alcohol use: No    Alcohol/week: 0.0 standard drinks   With staff assistance, above reviewed with the patient today.  ROS: As per HPI, otherwise no specific complaints on a limited and focused system review  Lab Results Past 72 Hours    PHQ2/9:  Depression screen [facility] 2/9 04/30/2020 04/02/2020 01/11/2020 10/26/2019  Decreased Interest 0 0 0 0  Down, Depressed, Hopeless 0 0 0 0  PHQ - 2 Score 0 0 0 0  Altered sleeping 0 0 - 0  Tired, decreased energy 0 0 - 0  Change in appetite 0 0 - 1    Feeling bad or failure about yourself  0 0 - 0  Trouble concentrating 0 0 - 0  Moving slowly or fidgety/restless 0 0 - 0  Suicidal thoughts 0 0 - 0  PHQ-9 Score 0 0 - 1  Difficult doing work/chores Not difficult at all Not difficult at all - Not difficult at all  Some recent data might be hidden   PHQ-2/9 Result is neg  Fall Risk:  Fall Risk  04/30/2020 04/02/2020 01/11/2020 10/27/2019 10/26/2019  Falls in the past year? 1 1 1 1 1   Number falls in past yr: 1 1 1 1 1   Injury with Fall? 0 0 0 0 0  Risk for fall due to : - History of fall(s);Impaired balance/gait History of fall(s) History of fall(s) -  Follow up - - Falls prevention discussed Falls evaluation completed -   Objective:      Vitals:   04/30/20 1045  BP: 122/80  Pulse: (!) 105  Resp: 16  Temp: 98.3 F (36.8 C)  TempSrc: Temporal  SpO2: 96%  Weight: 138 lb 11.2 oz (62.9 kg)  Height: 5\' 6"  (1.676 m)   Body mass index is 22.39 kg/m.  Physical Exam  NAD, masked, pleasant HEENT - conj - non-inj'ed, no focal sinus tenderness  Right and Left TM not visualized with cerumen in canals, more in the right than left  NT tugging on Right lobe, NT tugging on Left lobe  Neck - no subauricular adenopathy, no increased anterior cervical nodes or posterior cervical nodes, no rigidity  Affect not flat, approp with conversation         Results for orders placed or performed in visit on 04/25/20  Comprehensive metabolic panel  Result Value Ref Range   Sodium 141 135 - 145 mmol/L   Potassium 3.8 3.5 - 5.1 mmol/L   Chloride 103 98 - 111 mmol/L   CO2 28 22 - 32 mmol/L   Glucose, Bld 84 70 - 99 mg/dL   BUN 17 8 - 23 mg/dL   Creatinine, Ser 0.71 0.44 - 1.00 mg/dL   Calcium 9.0 8.9 - 10.3 mg/dL   Total Protein 7.0 6.5 - 8.1 g/dL   Albumin 3.9 3.5 - 5.0 g/dL   AST 48 (H) 15 - 41 U/L   ALT 42 0 - 44 U/L   Alkaline Phosphatase 380 (H) 38 - 126 U/L   Total Bilirubin 1.0 0.3 - 1.2 mg/dL  GFR calc non Af Amer >60 >60 mL/min   GFR  calc Af Amer >60 >60 mL/min   Anion gap 10 5 - 15  CBC with Differential  Result Value Ref Range   WBC 6.3 4.0 - 10.5 K/uL   RBC 4.17 3.87 - 5.11 MIL/uL   Hemoglobin 13.7 12.0 - 15.0 g/dL   HCT 40.3 36 - 46 %   MCV 96.6 80.0 - 100.0 fL   MCH 32.9 26.0 - 34.0 pg   MCHC 34.0 30.0 - 36.0 g/dL   RDW 16.0 (H) 11.5 - 15.5 %   Platelets 141 (L) 150 - 400 K/uL   nRBC 0.0 0.0 - 0.2 %   Neutrophils Relative % 77 %   Neutro Abs 4.8 1.7 - 7.7 K/uL   Lymphocytes Relative 12 %   Lymphs Abs 0.7 0.7 - 4.0 K/uL   Monocytes Relative 10 %   Monocytes Absolute 0.6 0 - 1 K/uL   Eosinophils Relative 1 %   Eosinophils Absolute 0.1 0 - 0 K/uL   Basophils Relative 0 %   Basophils Absolute 0.0 0 - 0 K/uL   Immature Granulocytes 0 %   Abs Immature Granulocytes 0.02 0.00 - 0.07 K/uL  *Note: Due to a large number of results and/or encounters for the requested time period, some results have not been displayed. A complete set of results can be found in Results Review.   Assessment & Plan:  A/P - cerumen impaction  Discussed the procedure of irrigation and patient agreed to proceed and this was done without incident with Melissa doing in the office today  A large amount of cerumen was successfully irrigated  Exam after noted min erythema in the canal (likely from procedure), TM clear.  Also, she noted could hear now much better   Towanda Malkin, MD  04/30/20 10:54 AM

## 2020-05-02 ENCOUNTER — Inpatient Hospital Stay: Payer: Medicare HMO | Attending: Internal Medicine

## 2020-05-02 DIAGNOSIS — G629 Polyneuropathy, unspecified: Secondary | ICD-10-CM | POA: Insufficient documentation

## 2020-05-02 DIAGNOSIS — R7989 Other specified abnormal findings of blood chemistry: Secondary | ICD-10-CM | POA: Insufficient documentation

## 2020-05-02 DIAGNOSIS — R5381 Other malaise: Secondary | ICD-10-CM | POA: Insufficient documentation

## 2020-05-02 DIAGNOSIS — Z803 Family history of malignant neoplasm of breast: Secondary | ICD-10-CM | POA: Insufficient documentation

## 2020-05-02 DIAGNOSIS — Z86718 Personal history of other venous thrombosis and embolism: Secondary | ICD-10-CM | POA: Insufficient documentation

## 2020-05-02 DIAGNOSIS — Z5189 Encounter for other specified aftercare: Secondary | ICD-10-CM | POA: Insufficient documentation

## 2020-05-02 DIAGNOSIS — C50412 Malignant neoplasm of upper-outer quadrant of left female breast: Secondary | ICD-10-CM | POA: Insufficient documentation

## 2020-05-02 DIAGNOSIS — R000 Tachycardia, unspecified: Secondary | ICD-10-CM | POA: Insufficient documentation

## 2020-05-02 DIAGNOSIS — R5383 Other fatigue: Secondary | ICD-10-CM | POA: Insufficient documentation

## 2020-05-02 DIAGNOSIS — R634 Abnormal weight loss: Secondary | ICD-10-CM | POA: Insufficient documentation

## 2020-05-02 DIAGNOSIS — Z5111 Encounter for antineoplastic chemotherapy: Secondary | ICD-10-CM | POA: Insufficient documentation

## 2020-05-02 DIAGNOSIS — Z79899 Other long term (current) drug therapy: Secondary | ICD-10-CM | POA: Insufficient documentation

## 2020-05-02 DIAGNOSIS — R630 Anorexia: Secondary | ICD-10-CM | POA: Insufficient documentation

## 2020-05-02 DIAGNOSIS — C7931 Secondary malignant neoplasm of brain: Secondary | ICD-10-CM | POA: Insufficient documentation

## 2020-05-02 DIAGNOSIS — Z7901 Long term (current) use of anticoagulants: Secondary | ICD-10-CM | POA: Insufficient documentation

## 2020-05-02 DIAGNOSIS — Z170 Estrogen receptor positive status [ER+]: Secondary | ICD-10-CM | POA: Insufficient documentation

## 2020-05-02 NOTE — Progress Notes (Signed)
Patient with metastatic breast cancer with leptomenigeal disease to brain.  Patient receiving chemotherapy.    Spoke with patient via phone for nutrition follow-up.  Patient reports appetite is so-so.  Patient reports that she wishes she could drive to get her something to eat when she has a taste for it but understands why the doctors do not want her to drive.  Eating chicken strips with french fries yesterday at lunch and dinner.  Trying to drink 3 ensure daily.   Medications: reviewed Labs: reviewed  Weight 138 lb on 8/3 decreased from 144 lb 8 oz on 6/17 151 lb on 5/3  Nutrition Diagnosis: Inadequate oral intake continues  Intervention: Encouraged eating at least 2 items at meals times (sandwich and chips, meat and vegetable) to provide more calories and protein.  Discussed frozen biscuits she can purchase in the grocery store and put in freezer to heat up.  Misses going to [employer] 3 days per week to get biscuit.  Continue ensure enlive TID  Patient has contact information  Next visit: Sept 9th during treatment  Sarah Horne, [place], [place] Registered Dietitian [phone] (mobile)

## 2020-05-09 ENCOUNTER — Inpatient Hospital Stay: Payer: Medicare HMO

## 2020-05-09 ENCOUNTER — Other Ambulatory Visit: Payer: Self-pay

## 2020-05-09 ENCOUNTER — Inpatient Hospital Stay (HOSPITAL_BASED_OUTPATIENT_CLINIC_OR_DEPARTMENT_OTHER): Payer: Medicare HMO | Admitting: Internal Medicine

## 2020-05-09 DIAGNOSIS — Z7901 Long term (current) use of anticoagulants: Secondary | ICD-10-CM | POA: Diagnosis not present

## 2020-05-09 DIAGNOSIS — C50412 Malignant neoplasm of upper-outer quadrant of left female breast: Secondary | ICD-10-CM | POA: Diagnosis not present

## 2020-05-09 DIAGNOSIS — C7931 Secondary malignant neoplasm of brain: Secondary | ICD-10-CM | POA: Diagnosis not present

## 2020-05-09 DIAGNOSIS — Z86718 Personal history of other venous thrombosis and embolism: Secondary | ICD-10-CM | POA: Diagnosis not present

## 2020-05-09 DIAGNOSIS — Z170 Estrogen receptor positive status [ER+]: Secondary | ICD-10-CM

## 2020-05-09 DIAGNOSIS — R630 Anorexia: Secondary | ICD-10-CM | POA: Diagnosis not present

## 2020-05-09 DIAGNOSIS — Z79899 Other long term (current) drug therapy: Secondary | ICD-10-CM | POA: Diagnosis not present

## 2020-05-09 DIAGNOSIS — R5381 Other malaise: Secondary | ICD-10-CM | POA: Diagnosis not present

## 2020-05-09 DIAGNOSIS — R000 Tachycardia, unspecified: Secondary | ICD-10-CM | POA: Diagnosis not present

## 2020-05-09 DIAGNOSIS — G629 Polyneuropathy, unspecified: Secondary | ICD-10-CM | POA: Diagnosis not present

## 2020-05-09 DIAGNOSIS — Z803 Family history of malignant neoplasm of breast: Secondary | ICD-10-CM | POA: Diagnosis not present

## 2020-05-09 DIAGNOSIS — Z7189 Other specified counseling: Secondary | ICD-10-CM

## 2020-05-09 DIAGNOSIS — Z5111 Encounter for antineoplastic chemotherapy: Secondary | ICD-10-CM | POA: Diagnosis not present

## 2020-05-09 DIAGNOSIS — Z5189 Encounter for other specified aftercare: Secondary | ICD-10-CM | POA: Diagnosis not present

## 2020-05-09 DIAGNOSIS — R5383 Other fatigue: Secondary | ICD-10-CM | POA: Diagnosis not present

## 2020-05-09 DIAGNOSIS — R7989 Other specified abnormal findings of blood chemistry: Secondary | ICD-10-CM | POA: Diagnosis not present

## 2020-05-09 DIAGNOSIS — R634 Abnormal weight loss: Secondary | ICD-10-CM | POA: Diagnosis not present

## 2020-05-09 LAB — COMPREHENSIVE METABOLIC PANEL
ALT: 47 U/L — ABNORMAL HIGH (ref 0–44)
AST: 54 U/L — ABNORMAL HIGH (ref 15–41)
Albumin: 3.7 g/dL (ref 3.5–5.0)
Alkaline Phosphatase: 401 U/L — ABNORMAL HIGH (ref 38–126)
Anion gap: 9 (ref 5–15)
BUN: 16 mg/dL (ref 8–23)
CO2: 28 mmol/L (ref 22–32)
Calcium: 9 mg/dL (ref 8.9–10.3)
Chloride: 103 mmol/L (ref 98–111)
Creatinine, Ser: 0.75 mg/dL (ref 0.44–1.00)
GFR calc Af Amer: >60 mL/min (ref >60)
GFR calc non Af Amer: >60 mL/min (ref >60)
Glucose, Bld: 83 mg/dL (ref 70–99)
Potassium: 3.7 mmol/L (ref 3.5–5.1)
Sodium: 140 mmol/L (ref 135–145)
Total Bilirubin: 1.1 mg/dL (ref 0.3–1.2)
Total Protein: 7.1 g/dL (ref 6.5–8.1)

## 2020-05-09 LAB — CBC WITH DIFFERENTIAL/PLATELET
Abs Immature Granulocytes: 0.01 10*3/uL (ref 0.00–0.07)
Basophils Absolute: 0 10*3/uL (ref 0.0–0.1)
Basophils Relative: 0 %
Eosinophils Absolute: 0.1 10*3/uL (ref 0.0–0.5)
Eosinophils Relative: 1 %
HCT: 41.7 % (ref 36.0–46.0)
Hemoglobin: 14.1 g/dL (ref 12.0–15.0)
Immature Granulocytes: 0 %
Lymphocytes Relative: 12 %
Lymphs Abs: 0.6 10*3/uL — ABNORMAL LOW (ref 0.7–4.0)
MCH: 32.3 pg (ref 26.0–34.0)
MCHC: 33.8 g/dL (ref 30.0–36.0)
MCV: 95.4 fL (ref 80.0–100.0)
Monocytes Absolute: 0.5 10*3/uL (ref 0.1–1.0)
Monocytes Relative: 11 %
Neutro Abs: 3.6 10*3/uL (ref 1.7–7.7)
Neutrophils Relative %: 76 %
Platelets: 159 10*3/uL (ref 150–400)
RBC: 4.37 MIL/uL (ref 3.87–5.11)
RDW: 15.2 % (ref 11.5–15.5)
WBC: 4.8 10*3/uL (ref 4.0–10.5)
nRBC: 0 % (ref 0.0–0.2)

## 2020-05-09 MED ORDER — SODIUM CHLORIDE 0.9% FLUSH
10.0000 mL | Freq: Once | INTRAVENOUS | Status: AC
  Administered 2020-05-09: 10 mL via INTRAVENOUS
  Filled 2020-05-09: qty 10

## 2020-05-09 MED ORDER — HEPARIN SOD (PORK) LOCK FLUSH 100 UNIT/ML IV SOLN
500.0000 [IU] | Freq: Once | INTRAVENOUS | Status: DC | PRN
  Filled 2020-05-09: qty 5

## 2020-05-09 MED ORDER — HEPARIN SOD (PORK) LOCK FLUSH 100 UNIT/ML IV SOLN
500.0000 [IU] | Freq: Once | INTRAVENOUS | Status: AC
  Administered 2020-05-09: 500 [IU] via INTRAVENOUS
  Filled 2020-05-09: qty 5

## 2020-05-09 MED ORDER — SODIUM CHLORIDE 0.9 % IV SOLN
Freq: Once | INTRAVENOUS | Status: AC
  Filled 2020-05-09: qty 250

## 2020-05-09 MED ORDER — PROCHLORPERAZINE MALEATE 10 MG PO TABS
10.0000 mg | ORAL_TABLET | Freq: Once | ORAL | Status: AC
  Administered 2020-05-09: 10 mg via ORAL
  Filled 2020-05-09: qty 1

## 2020-05-09 MED ORDER — PEGFILGRASTIM 6 MG/0.6ML ~~LOC~~ PSKT
6.0000 mg | PREFILLED_SYRINGE | Freq: Once | SUBCUTANEOUS | Status: AC
  Administered 2020-05-09: 6 mg via SUBCUTANEOUS
  Filled 2020-05-09: qty 0.6

## 2020-05-09 MED ORDER — SODIUM CHLORIDE 0.9 % IV SOLN
1800.0000 mg | Freq: Once | INTRAVENOUS | Status: AC
  Administered 2020-05-09: 1800 mg via INTRAVENOUS
  Filled 2020-05-09: qty 26.3

## 2020-05-09 NOTE — Progress Notes (Signed)
[facility] OFFICE PROGRESS NOTE  Patient Care Team: Delsa Grana, PA-C as PCP - General (Family Medicine) Wellington Hampshire, MD as PCP - Cardiology (Cardiology) Bary Castilla, Forest Gleason, MD (General Surgery) Vladimir Crofts, MD as Consulting Physician (Neurology) Cammie Sickle, MD as Consulting Physician (Internal Medicine)  Cancer Staging No matching staging information was found for the patient.   Oncology History Overview Note  # LEFT BREAST IDC; STAGE II [cT2N1] ER >90%; PR- 50-90%; her 2 NEU- POS; s/p Neoadj chemo; AUG 2016- TCH+P s/p Lumpec & partial ALND- path CR; s/p RT [finished Nov 2016];  adj Herceptin; HELD for Jan 19th 2017 [in DEC 2016-EF dropped from 63 to 51%; FEB 27th EF-42.9%]; JAN 2016 START Arimidex; May 2017- EF- 60%; May 24th 2017-Re-start Herceptin q 3W; July 28th STOP herceptin [finished 12m/m2; sec to Low EF; however 2017 Oct EF= 67%; improved]  # DEC 4th 2017- Start Neratinib 4 pills; DEC 11th 3 pills; STOPPED.  # FEB 2018- RECURRENCE ER/PR positive; Her 2 NEGATIVE [liver Bx]  # MARCH 1st 2018- Tax-Cytoxan [poor tol]  # FEB 2018- Brain mets [SBRT; Dr.Crystal; finished Feb 28th 2018]  # March 2018- Eribulin- poor tolerance  # June 8th 2018- Started Faslodex + Abema [abema-multiple interruptions sec to neutropenia]  # MRI Brain- March 2019- Leptomeningeal mets [WBRT; No LP s/p WBRT- 01/09/2018]; STOP Abema [577mday-sec to severe cytopenia]+faslodex  # MAY 20tth 019- Start Xeloda; STOPPED in Nov 22nd 2019- sec to Progressive liver lesions on CT scan  # NOV 26th 2019- Faslodex + Piqaray 25058m; Jan 2020- Piqray 300 mg+ faslodex; FEB 17th CT scan- mixed response/ Overall progression; STOP Piqray + faslodex.   # FEB 25th 2020- ERIBULIN; Poor tolerance sec to severe neutropenia.  Stopped May 2020.  CT scan May 2020-new/progressive 2 cm liver lesion;   #July nd 2020-Ixempra x5 cycles-; NOV 2020-MRI liver- worsening liver lesions [rising  AST/ALT].   # NOV [id number] weekly [consent-]; December 31-2020-progression/rising LFTs.  Stop Taxol  #January 15th, 2021-Adriamycin weekly [C]; MARCH 2021- Partial response. STOP ADRIAMYCIN sec to EF drop 40-45%.   # April 2nd-2021-gemcitabine; 01/15/2020-GEM [800 mg/m2; #3 cycle 1000m7m] q 2 W; with onpro.   #May 06, 2019-MRI brain 5 new lesions [3 mm to 10 mm]-asymptomatic; [previous whole brain radiation]- OCT 2020- Re-Irradiation.   # PN- G-2; may 2017- Cymbalta 60mg32mARCH 2018-  acute vascular insuff of BIL LE [? Taxotere vasoconstriction on asprin]; # hemorragic shock LGIB [s/p colo; Dr.Wohl]  #  SVT- on flecainide.   # April 2016- Liver Bx- NEG;   # Drop in EF from Herceptin [recovered OCt 27th- EF 65%]; March 2020 ejection fraction 40 to 45%.  Stop Adriamycin.  # BMD- jan 2017- osteopenia; #Gait instability/visual disturbance-[Dr.Shah]- S/p EEG [5/13]- negative for seizures.  # 2020- SEP-s/p Pallaitive care evaluation  -----------------------------------------------------------     MOLECULAR TESTING- [facility]- Sep 4th 2018- PDL-1 0%/NEG for BRCA; MSI-Stable; Positive for PI3K; ccnd-1; FGF** -----------------------------------------------------------  Dx: Metastatic Breast cancer [ER/PR-Pos; her -2 NEG] Stage IV; goals: palliative Current treatment-gemcitabine chemotherapy [C]   Carcinoma of upper-outer quadrant of left breast in female, estrogen receptor positive (HCC) [place]25/2020 - 01/17/2019 Chemotherapy   The patient had eriBULin mesylate (HALAVEN) 2.1 mg in sodium chloride 0.9 % 100 mL chemo infusion, 1.2 mg/m2 = 2.1 mg (100 % of original dose 1.2 mg/m2), Intravenous,  Once, 3 of 4 cycles Dose modification: 1.2 mg/m2 (original dose 1.2 mg/m2, Cycle 1, Reason: Provider Judgment), 0.8 mg/m2 (original  dose 1.2 mg/m2, Cycle 3, Reason: Provider Judgment) Administration: 2.1 mg (11/22/2018), 2.1 mg (11/29/2018), 2.1 mg (12/20/2018), 1.4 mg (01/17/2019)  for  chemotherapy treatment.    03/30/2019 - 07/12/2019 Chemotherapy   The patient had pegfilgrastim (NEULASTA ONPRO KIT) injection 6 mg, 6 mg, Subcutaneous, Once, 5 of 8 cycles Administration: 6 mg (03/30/2019), 6 mg (04/20/2019), 6 mg (05/11/2019), 6 mg (06/22/2019), 6 mg (06/01/2019) ixabepilone (IXEMPRA) 60 mg in lactated ringers 250 mL chemo infusion, 33.5 mg/m2 = 57 mg (100 % of original dose 32 mg/m2), Intravenous,  Once, 5 of 8 cycles Dose modification: 32 mg/m2 (original dose 32 mg/m2, Cycle 1, Reason: Provider Judgment) Administration: 72 mg (04/20/2019), 72 mg (05/11/2019), 72 mg (06/22/2019), 72 mg (06/01/2019)  for chemotherapy treatment.    08/18/2019 - 09/28/2019 Chemotherapy   The patient had PACLitaxel (TAXOL) 150 mg in sodium chloride 0.9 % 250 mL chemo infusion (</= 51m/m2), 80 mg/m2 = 150 mg, Intravenous,  Once, 1 of 4 cycles Administration: 150 mg (08/18/2019), 150 mg (09/08/2019), 150 mg (09/15/2019)  for chemotherapy treatment.    10/13/2019 - 11/30/2019 Chemotherapy   The patient had DOXOrubicin (ADRIAMYCIN) chemo injection 38 mg, 20 mg/m2 = 38 mg, Intravenous,  Once, 6 of 9 cycles Administration: 38 mg (10/13/2019), 38 mg (10/20/2019), 38 mg (10/27/2019), 38 mg (11/17/2019), 38 mg (11/24/2019), 38 mg (11/10/2019) palonosetron (ALOXI) injection 0.25 mg, 0.25 mg, Intravenous,  Once, 6 of 9 cycles Administration: 0.25 mg (10/13/2019), 0.25 mg (10/20/2019), 0.25 mg (10/27/2019), 0.25 mg (11/17/2019), 0.25 mg (11/24/2019), 0.25 mg (11/10/2019)  for chemotherapy treatment.    12/29/2019 -  Chemotherapy   The patient had pegfilgrastim (NEULASTA ONPRO KIT) injection 6 mg, 6 mg, Subcutaneous, Once, 9 of 10 cycles Administration: 6 mg (01/15/2020), 6 mg (01/29/2020), 6 mg (02/12/2020), 6 mg (02/28/2020), 6 mg (03/14/2020), 6 mg (03/28/2020), 6 mg (04/11/2020), 6 mg (04/25/2020), 6 mg (05/09/2020) gemcitabine (GEMZAR) 1,444 mg in sodium chloride 0.9 % 250 mL chemo infusion, 800 mg/m2 = 1,444 mg, Intravenous,  Once, 10 of 11  cycles Dose modification: 1,000 mg/m2 (original dose 800 mg/m2, Cycle 3, Reason: Provider Judgment) Administration: 1,444 mg (12/29/2019), 1,444 mg (01/15/2020), 1,800 mg (01/29/2020), 1,800 mg (02/12/2020), 1,800 mg (02/28/2020), 1,800 mg (03/14/2020), 1,800 mg (03/28/2020), 1,800 mg (04/11/2020), 1,800 mg (04/25/2020), 1,800 mg (05/09/2020)  for chemotherapy treatment.      INTERVAL HISTORY:  Sarah LATKA89y.o.  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  No nausea no vomiting.  Mild to moderate fatigue.  No new chest pain or shortness of breath or cough.  No headaches.  Continues to have gait instability.  No falls.  Review of Systems  Constitutional: Positive for malaise/fatigue and weight loss. Negative for chills, diaphoresis and fever.  HENT: Negative for nosebleeds and sore throat.   Eyes: Negative for double vision.  Respiratory: Negative for cough, hemoptysis, sputum production, shortness of breath and wheezing.   Cardiovascular: Negative for chest pain, palpitations, orthopnea and leg swelling.  Gastrointestinal: Positive for constipation. Negative for abdominal pain, blood in stool, heartburn, melena, nausea and vomiting.  Genitourinary: Negative for dysuria, frequency and urgency.  Musculoskeletal: Positive for joint pain. Negative for back pain.  Skin: Negative for itching.  Neurological: Positive for dizziness and tingling. Negative for focal weakness, weakness and headaches.  Endo/Heme/Allergies: Does not bruise/bleed easily.  Psychiatric/Behavioral: Negative for depression. The patient is not nervous/anxious and does not have  insomnia.     PAST MEDICAL HISTORY :  Past Medical History:  Diagnosis Date  . Chemotherapy-induced peripheral neuropathy ([place])   . Epistaxis    a. 11/2016 in setting of asa/plavix-->silver nitrate cauterization.  . GI bleed     a. 11/2016 Admission w/ GIB and hypovolemic shock req 3u PRBC's;  b. 11/2016 ECG: gastritis & nonbleeding peptic ulcer; c. 11/2016 Conlonoscopy: rectal and sigmoid colonic ulcers.  . Heart attack ([place])    a. 1998 Cath @ [facility]: reportedly no intervention required.  Marland Kitchen Herceptin-induced cardiomyopathy ([place])    a. In the setting of Herceptin Rx for breast cancer (initiated 12/2014); b. 03/2015 MUGA EF 64%; b. 08/2015 MUGA: EF 51%; c. 10/2015 MUGA: EF 44%; d. 11/2015 Echo: EF 45-50%; e. 01/2016 MUGA: EF 60%; f. 06/2016 MUGA EF 65%; g. 10/2016 MUGA: EF 61%;  h. 12/2016 Echo: EF 55-60%, gr1 DD.  Marland Kitchen Hyperlipidemia   . Hypertension   . Neuropathy   . Possible PAD (peripheral artery disease) ([place])    a. 11/2016 LE cyanosis and weak pulses-->CTA w/o significant Ao-BiFem dzs. ? distal dzs-->ASA/Plavix initiated by vascular surgery.  Marland Kitchen PSVT (paroxysmal supraventricular tachycardia) ([place])    a. Dx 11/2016.  . Pulmonary embolism ([place])    a. 12/2016 CTA Chest: small nonocclusive PE in inferior segment of the Left lingula, somewhat eccentric filling defect suggesting chronic rather than acute embolic event; b. 02/2693 LE U/S:  No DVT; c. 12/2016 Echo: Nl RV fxn, nl PASP.  Marland Kitchen Recurrent Metastatic breast cancer ([place])    a. Dx 2016: Stage II, ER positive, PR positive, HER-2/neu overexpressing of the left breast-->chemo/radiation; b. 10/2016 CT Abd/pelvis: diffuse liver mets, ill defined sclerotic bone lesions-T12;  c. 10/2016 MRI brain: metastatic lesion along L temporal lobe (19x104m) w/ extensive surrounding edema & 558mmidline shift to right.  . Sepsis ([place]  . Sinus tachycardia     PAST SURGICAL HISTORY :   Past Surgical History:  Procedure Laterality Date  . BREAST BIOPSY Left 2016   Positive  . BREAST LUMPECTOMY WITH SENTINEL LYMPH NODE BIOPSY Left 05/23/2015   Procedure: LEFT BREAST WIDE EXCISION WITH AXILLARY DISSECTION, MASTOPLASTY ;  Surgeon: JeRobert BellowMD;  Location: [facility]S;  Service: General;  Laterality:  Left;  . BREAST SURGERY Left 12/18/14   breast biopsy/INVASIVE DUCTAL CARCINOMA OF BREAST, NOTTINGHAM GRADE 2.  . Marland KitchenREAST SURGERY  05/23/2015.   Wide excision/mastoplasty, axillary dissection. No residual invasive cancer, positive for residual DCIS. 0/2 nodes identified on axillary dissection. (no SLN by technetium or methylene blue)  . CARDIAC CATHETERIZATION    . COLONOSCOPY WITH PROPOFOL N/A 12/10/2016   Procedure: COLONOSCOPY WITH PROPOFOL;  Surgeon: DaLucilla LameMD;  Location: [facility] ENDOSCOPY;  Service: Endoscopy;  Laterality: N/A;  . COLONOSCOPY WITH PROPOFOL N/A 02/13/2017   Procedure: COLONOSCOPY WITH PROPOFOL;  Surgeon: WoLucilla LameMD;  [facility];  Service: Endoscopy;  Laterality: N/A;  . ESOPHAGOGASTRODUODENOSCOPY N/A 11/03/2019   Procedure: ESOPHAGOGASTRODUODENOSCOPY (EGD);  Surgeon: VaLin LandsmanMD;  [facility];  Service: Gastroenterology;  Laterality: N/A;  . ESOPHAGOGASTRODUODENOSCOPY (EGD) WITH PROPOFOL N/A 12/08/2016   Procedure: ESOPHAGOGASTRODUODENOSCOPY (EGD) WITH PROPOFOL;  Surgeon: DaLucilla LameMD;  Location: [facility] ENDOSCOPY;  Service: Endoscopy;  Laterality: N/A;  . IVC FILTER INSERTION N/A 02/15/2017   Procedure: IVC Filter Insertion;  Surgeon: DeAlgernon HuxleyMD;  [facility];  Service: Cardiovascular;  Laterality: N/A;  . PORTACATH PLACEMENT Right 12-31-14   Dr ByBary Castilla  FAMILY HISTORY :   Family History  Problem Relation Age of Onset  . Breast cancer Maternal Aunt   . Breast cancer Cousin   . Brain cancer Maternal Uncle   . Diabetes Mother   . Hypertension Mother   . Stroke Mother     SOCIAL HISTORY:   Social History   Tobacco Use  . Smoking status: Light Tobacco Smoker    Packs/day: 0.25    Years: 18.00    Pack years: 4.50    Types: Cigarettes  . Smokeless tobacco: Never Used  Vaping Use  . Vaping Use: Never used  Substance Use Topics  . Alcohol use: No    Alcohol/week: 0.0 standard drinks  . Drug  use: No    ALLERGIES:  is allergic to no known allergies.  MEDICATIONS:  Current Outpatient Medications  Medication Sig Dispense Refill  . acetaminophen (TYLENOL) 650 MG CR tablet Take 650 mg by mouth every 8 (eight) hours as needed for pain.    Marland Kitchen apixaban (ELIQUIS) 5 MG TABS tablet Take 1 tablet (5 mg total) by mouth 2 (two) times daily. 60 tablet 4  . flecainide (TAMBOCOR) 50 MG tablet Take 1 tablet (50 mg total) by mouth 2 (two) times daily. 60 tablet 4  . levETIRAcetam (KEPPRA) 250 MG tablet Take 1 tablet by mouth 2 (two) times daily.     . magnesium oxide (MAG-OX) 400 MG tablet TAKE 1 TABLET BY MOUTH TWICE A DAY 60 tablet 4  . meclizine (ANTIVERT) 25 MG tablet Take 1 tablet (25 mg total) by mouth 3 (three) times daily as needed for dizziness. 30 tablet 0  . metoprolol tartrate (LOPRESSOR) 25 MG tablet TAKE 1 TABLET BY MOUTH TWICE A DAY 60 tablet 1  . montelukast (SINGULAIR) 10 MG tablet Take 1 tablet (10 mg total) by mouth at bedtime. 60 tablet 3  . ondansetron (ZOFRAN) 8 MG tablet One pill every 8 hours as needed for nausea/vomitting. 40 tablet 1  . pregabalin (LYRICA) 100 MG capsule TAKE 1 CAPSULE BY MOUTH TWICE A DAY 60 capsule 2  . prochlorperazine (COMPAZINE) 10 MG tablet Take 1 tablet (10 mg total) by mouth every 6 (six) hours as needed for nausea or vomiting. 40 tablet 1  . Vitamin D, Ergocalciferol, (DRISDOL) 1.25 MG (50000 UNIT) CAPS capsule TAKE 1 CAPSULE (50,000 UNITS TOTAL) ONCE A WEEK BY MOUTH. 12 capsule 1   No current facility-administered medications for this visit.   Facility-Administered Medications Ordered in Other Visits  Medication Dose Route Frequency Provider Last Rate Last Admin  . 0.9 %  sodium chloride infusion   Intravenous Continuous [name], MD   New Bag at 12/10/16 1140  . 0.9 %  sodium chloride infusion   Intravenous Continuous Lloyd Huger, MD 999 mL/hr at 04/24/15 1520 New Bag at 05/23/15 0951  . 0.9 %  sodium chloride infusion    Intravenous Continuous Verlon Au, NP 999 mL/hr at 10/31/19 1522 New Bag at 10/31/19 1522  . heparin lock flush 100 unit/mL  500 Units Intravenous Once Berenzon, Dmitriy, MD      . heparin lock flush 100 unit/mL  500 Units Intracatheter Once PRN Charlaine Dalton R, MD      . sodium chloride 0.9 % injection 10 mL  10 mL Intravenous PRN [name], MD   10 mL at 04/04/15 1440  . sodium chloride flush (NS) 0.9 % injection 10 mL  10 mL Intravenous PRN Roxana Hires, MD  PHYSICAL EXAMINATION: ECOG PERFORMANCE STATUS: 1 - Symptomatic but completely ambulatory  BP 116/79   Pulse 76   Temp (!) 96.8 F (36 C) (Tympanic)   Resp 18   Ht _0  (1.676 m)   Wt 133 lb (60.3 kg)   BMI 21.47 kg/m   Filed Weights   05/09/20 0933  Weight: 133 lb (60.3 kg)    Physical Exam Constitutional:      Comments: Patient is alone.  In wheel chair.   HENT:     Head: Normocephalic and atraumatic.     Mouth/Throat:     Pharynx: No oropharyngeal exudate.  Eyes:     Pupils: Pupils are equal, round, and reactive to light.  Cardiovascular:     Rate and Rhythm: Normal rate and regular rhythm.  Pulmonary:     Effort: Pulmonary effort is normal. No respiratory distress.     Breath sounds: Normal breath sounds. No wheezing.  Abdominal:     General: Bowel sounds are normal. There is no distension.     Palpations: Abdomen is soft. There is no mass.     Tenderness: There is no abdominal tenderness. There is no guarding or rebound.  Musculoskeletal:        General: No tenderness. Normal range of motion.     Cervical back: Normal range of motion and neck supple.  Skin:    General: Skin is warm.  Neurological:     Mental Status: She is alert and oriented to person, place, and time.     Comments: Broad based gait.  Unsteady.  Psychiatric:        Mood and Affect: Affect normal.     LABORATORY DATA:  I have reviewed the data as listed    Component Value Date/Time   NA 140 05/09/2020  0916   NA 135 03/04/2017 0918   NA 135 01/08/2015 0850   K 3.7 05/09/2020 0916   K 3.7 01/08/2015 0850   CL 103 05/09/2020 0916   CL 101 01/08/2015 0850   CO2 28 05/09/2020 0916   CO2 26 01/08/2015 0850   GLUCOSE 83 05/09/2020 0916   GLUCOSE 161 (H) 01/08/2015 0850   BUN 16 05/09/2020 0916   BUN 8 03/04/2017 0918   BUN 17 01/08/2015 0850   CREATININE 0.75 05/09/2020 0916   CREATININE 0.82 01/22/2015 1559   CALCIUM 9.0 05/09/2020 0916   CALCIUM 9.3 01/08/2015 0850   PROT 7.1 05/09/2020 0916   PROT 6.4 03/04/2017 0918   PROT 6.8 01/22/2015 1559   ALBUMIN 3.7 05/09/2020 0916   ALBUMIN 2.5 (L) 03/04/2017 0918   ALBUMIN 3.9 01/22/2015 1559   AST 54 (H) 05/09/2020 0916   AST 34 01/22/2015 1559   ALT 47 (H) 05/09/2020 0916   ALT 42 01/22/2015 1559   ALKPHOS 401 (H) 05/09/2020 0916   ALKPHOS 157 (H) 01/22/2015 1559   BILITOT 1.1 05/09/2020 0916   BILITOT 6.2 (H) 03/04/2017 0918   BILITOT 0.2 (L) 01/22/2015 1559   GFRNONAA >60 05/09/2020 0916   GFRNONAA >60 01/22/2015 1559   GFRAA >60 05/09/2020 0916   GFRAA >60 01/22/2015 1559    No results found for: SPEP, UPEP  Lab Results  Component Value Date   WBC 4.8 05/09/2020   NEUTROABS 3.6 05/09/2020   HGB 14.1 05/09/2020   HCT 41.7 05/09/2020   MCV 95.4 05/09/2020   PLT 159 05/09/2020      Chemistry      Component Value Date/Time   NA 140 05/09/2020  0916   NA 135 03/04/2017 0918   NA 135 01/08/2015 0850   K 3.7 05/09/2020 0916   K 3.7 01/08/2015 0850   CL 103 05/09/2020 0916   CL 101 01/08/2015 0850   CO2 28 05/09/2020 0916   CO2 26 01/08/2015 0850   BUN 16 05/09/2020 0916   BUN 8 03/04/2017 0918   BUN 17 01/08/2015 0850   CREATININE 0.75 05/09/2020 0916   CREATININE 0.82 01/22/2015 1559      Component Value Date/Time   CALCIUM 9.0 05/09/2020 0916   CALCIUM 9.3 01/08/2015 0850   ALKPHOS 401 (H) 05/09/2020 0916   ALKPHOS 157 (H) 01/22/2015 1559   AST 54 (H) 05/09/2020 0916   AST 34 01/22/2015 1559   ALT 47  (H) 05/09/2020 0916   ALT 42 01/22/2015 1559   BILITOT 1.1 05/09/2020 0916   BILITOT 6.2 (H) 03/04/2017 0918   BILITOT 0.2 (L) 01/22/2015 1559       RADIOGRAPHIC STUDIES: I have personally reviewed the radiological images as listed and agreed with the findings in the report. No results found.   ASSESSMENT & PLAN:  Carcinoma of upper-outer quadrant of left breast in female, estrogen receptor positive ([place]) # Recurrent metastatic breast cancer-lung/liver metastases ER PR positive HER-2 negative; on Gemcitabine; June 29th CT improvement of LUL ~2.9 cm; Liver lesions- smaller. STABLE;   # proceed with gemcitabine #10 every 2 weeks[sec to neutropenia/on pro].  Labs today reviewed;  acceptable for treatment today.   #Gait instability/visual disturbance-[Dr.Shah]-clinically-S/p EEG [5/13]- negative for seizures; on keppra- STABLE.   # cardiomyopathy/ Sinus tachycardia- on metoprolol-/Tamabcor; [march 2d-echo- 40-45%]; STABLE.   # Elevated LFTs-likely secondary underlying cirrhosis/ chemo/underlying malignancy-  STABLE monitor for now.    # Peripheral neuropathy grade 1-2-  on Lyrica- STABLE.   # weight loss/ poor appetite; sec to malignancy/ chemo-STABLE.   # Right lower extremity DVT bilateral [November 28, 2019] CT scan abdomen pelvis negative for any DVT.  On eliquis; STABLE.   # DISPOSITION:  # gemcitabine/onprpo today;  # 2 weeks MD; labs- cbc/cmp;GEM- onpro; -Dr.B     No orders of the defined types were placed in this encounter.  All questions were answered. The patient knows to call the clinic with any problems, questions or concerns.     Cammie Sickle, MD 05/09/2020 12:25 PM

## 2020-05-09 NOTE — Assessment & Plan Note (Addendum)
#  Recurrent metastatic breast cancer-lung/liver metastases ER PR positive HER-2 negative; on Gemcitabine; June 29th CT improvement of LUL ~2.9 cm; Liver lesions- smaller. STABLE;   # proceed with gemcitabine #10 every 2 weeks[sec to neutropenia/on pro].  Labs today reviewed;  acceptable for treatment today.   #Gait instability/visual disturbance-[Dr.Shah]-clinically-S/p EEG [5/13]- negative for seizures; on keppra- STABLE.   # cardiomyopathy/ Sinus tachycardia- on metoprolol-/Tamabcor; [march 2d-echo- 40-45%]; STABLE.   # Elevated LFTs-likely secondary underlying cirrhosis/ chemo/underlying malignancy-  STABLE monitor for now.    # Peripheral neuropathy grade 1-2-  on Lyrica- STABLE.   # weight loss/ poor appetite; sec to malignancy/ chemo-STABLE.   # Right lower extremity DVT bilateral [November 28, 2019] CT scan abdomen pelvis negative for any DVT.  On eliquis; STABLE.   # DISPOSITION:  # gemcitabine/onprpo today;  # 2 weeks MD; labs- cbc/cmp;GEM- onpro; -Dr.B

## 2020-05-13 ENCOUNTER — Other Ambulatory Visit: Payer: Self-pay | Admitting: Internal Medicine

## 2020-05-21 ENCOUNTER — Other Ambulatory Visit: Payer: Self-pay | Admitting: Internal Medicine

## 2020-05-23 ENCOUNTER — Other Ambulatory Visit: Payer: Self-pay

## 2020-05-23 ENCOUNTER — Inpatient Hospital Stay (HOSPITAL_BASED_OUTPATIENT_CLINIC_OR_DEPARTMENT_OTHER): Payer: Medicare HMO | Admitting: Internal Medicine

## 2020-05-23 ENCOUNTER — Inpatient Hospital Stay: Payer: Medicare HMO

## 2020-05-23 ENCOUNTER — Inpatient Hospital Stay (HOSPITAL_BASED_OUTPATIENT_CLINIC_OR_DEPARTMENT_OTHER): Payer: Medicare HMO | Admitting: Hospice and Palliative Medicine

## 2020-05-23 ENCOUNTER — Encounter: Payer: Self-pay | Admitting: Internal Medicine

## 2020-05-23 VITALS — BP 114/85 | HR 75 | Temp 98.4°F | Resp 16 | Ht 66.0 in | Wt 144.0 lb

## 2020-05-23 DIAGNOSIS — C50412 Malignant neoplasm of upper-outer quadrant of left female breast: Secondary | ICD-10-CM | POA: Diagnosis not present

## 2020-05-23 DIAGNOSIS — Z7189 Other specified counseling: Secondary | ICD-10-CM

## 2020-05-23 DIAGNOSIS — L84 Corns and callosities: Secondary | ICD-10-CM | POA: Diagnosis not present

## 2020-05-23 DIAGNOSIS — R5383 Other fatigue: Secondary | ICD-10-CM | POA: Diagnosis not present

## 2020-05-23 DIAGNOSIS — R7989 Other specified abnormal findings of blood chemistry: Secondary | ICD-10-CM | POA: Diagnosis not present

## 2020-05-23 DIAGNOSIS — Z5189 Encounter for other specified aftercare: Secondary | ICD-10-CM | POA: Diagnosis not present

## 2020-05-23 DIAGNOSIS — R5381 Other malaise: Secondary | ICD-10-CM | POA: Diagnosis not present

## 2020-05-23 DIAGNOSIS — R630 Anorexia: Secondary | ICD-10-CM | POA: Diagnosis not present

## 2020-05-23 DIAGNOSIS — Z170 Estrogen receptor positive status [ER+]: Secondary | ICD-10-CM | POA: Diagnosis not present

## 2020-05-23 DIAGNOSIS — Z5111 Encounter for antineoplastic chemotherapy: Secondary | ICD-10-CM | POA: Diagnosis not present

## 2020-05-23 DIAGNOSIS — C7931 Secondary malignant neoplasm of brain: Secondary | ICD-10-CM | POA: Diagnosis not present

## 2020-05-23 LAB — CBC WITH DIFFERENTIAL/PLATELET
Abs Immature Granulocytes: 0.01 10*3/uL (ref 0.00–0.07)
Basophils Absolute: 0 10*3/uL (ref 0.0–0.1)
Basophils Relative: 1 %
Eosinophils Absolute: 0.1 10*3/uL (ref 0.0–0.5)
Eosinophils Relative: 1 %
HCT: 41.1 % (ref 36.0–46.0)
Hemoglobin: 14.1 g/dL (ref 12.0–15.0)
Immature Granulocytes: 0 %
Lymphocytes Relative: 19 %
Lymphs Abs: 0.8 10*3/uL (ref 0.7–4.0)
MCH: 32.5 pg (ref 26.0–34.0)
MCHC: 34.3 g/dL (ref 30.0–36.0)
MCV: 94.7 fL (ref 80.0–100.0)
Monocytes Absolute: 0.5 10*3/uL (ref 0.1–1.0)
Monocytes Relative: 11 %
Neutro Abs: 3 10*3/uL (ref 1.7–7.7)
Neutrophils Relative %: 68 %
Platelets: 164 10*3/uL (ref 150–400)
RBC: 4.34 MIL/uL (ref 3.87–5.11)
RDW: 15.1 % (ref 11.5–15.5)
WBC: 4.4 10*3/uL (ref 4.0–10.5)
nRBC: 0 % (ref 0.0–0.2)

## 2020-05-23 LAB — COMPREHENSIVE METABOLIC PANEL
ALT: 40 U/L (ref 0–44)
AST: 51 U/L — ABNORMAL HIGH (ref 15–41)
Albumin: 3.8 g/dL (ref 3.5–5.0)
Alkaline Phosphatase: 393 U/L — ABNORMAL HIGH (ref 38–126)
Anion gap: 10 (ref 5–15)
BUN: 19 mg/dL (ref 8–23)
CO2: 27 mmol/L (ref 22–32)
Calcium: 8.9 mg/dL (ref 8.9–10.3)
Chloride: 103 mmol/L (ref 98–111)
Creatinine, Ser: 0.82 mg/dL (ref 0.44–1.00)
GFR calc Af Amer: >60 mL/min (ref >60)
GFR calc non Af Amer: >60 mL/min (ref >60)
Glucose, Bld: 86 mg/dL (ref 70–99)
Potassium: 3.8 mmol/L (ref 3.5–5.1)
Sodium: 140 mmol/L (ref 135–145)
Total Bilirubin: 1 mg/dL (ref 0.3–1.2)
Total Protein: 7.1 g/dL (ref 6.5–8.1)

## 2020-05-23 MED ORDER — SODIUM CHLORIDE 0.9 % IV SOLN
Freq: Once | INTRAVENOUS | Status: AC
  Filled 2020-05-23: qty 250

## 2020-05-23 MED ORDER — PEGFILGRASTIM 6 MG/0.6ML ~~LOC~~ PSKT
6.0000 mg | PREFILLED_SYRINGE | Freq: Once | SUBCUTANEOUS | Status: AC
  Administered 2020-05-23: 6 mg via SUBCUTANEOUS
  Filled 2020-05-23: qty 0.6

## 2020-05-23 MED ORDER — PROCHLORPERAZINE MALEATE 10 MG PO TABS
10.0000 mg | ORAL_TABLET | Freq: Once | ORAL | Status: AC
  Administered 2020-05-23: 10 mg via ORAL
  Filled 2020-05-23: qty 1

## 2020-05-23 MED ORDER — HEPARIN SOD (PORK) LOCK FLUSH 100 UNIT/ML IV SOLN
500.0000 [IU] | Freq: Once | INTRAVENOUS | Status: AC
  Administered 2020-05-23: 500 [IU] via INTRAVENOUS
  Filled 2020-05-23: qty 5

## 2020-05-23 MED ORDER — SODIUM CHLORIDE 0.9 % IV SOLN
1800.0000 mg | Freq: Once | INTRAVENOUS | Status: AC
  Administered 2020-05-23: 1800 mg via INTRAVENOUS
  Filled 2020-05-23: qty 26.3

## 2020-05-23 MED ORDER — SODIUM CHLORIDE 0.9% FLUSH
10.0000 mL | Freq: Once | INTRAVENOUS | Status: DC
  Filled 2020-05-23: qty 10

## 2020-05-23 NOTE — Progress Notes (Signed)
[place] OFFICE PROGRESS NOTE  Patient Care Team: Delsa Grana, PA-C as PCP - General (Family Medicine) Wellington Hampshire, MD as PCP - Cardiology (Cardiology) Bary Castilla, Forest Gleason, MD (General Surgery) Vladimir Crofts, MD as Consulting Physician (Neurology) Cammie Sickle, MD as Consulting Physician (Internal Medicine)  Cancer Staging No matching staging information was found for the patient.   Oncology History Overview Note  # LEFT BREAST IDC; STAGE II [cT2N1] ER >90%; PR- 50-90%; her 2 NEU- POS; s/p Neoadj chemo; AUG 2016- TCH+P s/p Lumpec & partial ALND- path CR; s/p RT [finished Nov 2016];  adj Herceptin; HELD for Jan 19th 2017 [in DEC 2016-EF dropped from 63 to 51%; FEB 27th EF-42.9%]; JAN 2016 START Arimidex; May 2017- EF- 60%; May 24th 2017-Re-start Herceptin q 3W; July 28th STOP herceptin [finished 57m/m2; sec to Low EF; however 2017 Oct EF= 67%; improved]  # DEC 4th 2017- Start Neratinib 4 pills; DEC 11th 3 pills; STOPPED.  # FEB 2018- RECURRENCE ER/PR positive; Her 2 NEGATIVE [liver Bx]  # MARCH 1st 2018- Tax-Cytoxan [poor tol]  # FEB 2018- Brain mets [SBRT; Dr.Crystal; finished Feb 28th 2018]  # March 2018- Eribulin- poor tolerance  # June 8th 2018- Started Faslodex + Abema [abema-multiple interruptions sec to neutropenia]  # MRI Brain- March 2019- Leptomeningeal mets [WBRT; No LP s/p WBRT- 01/09/2018]; STOP Abema [567mday-sec to severe cytopenia]+faslodex  # MAY 20tth 019- Start Xeloda; STOPPED in Nov 22nd 2019- sec to Progressive liver lesions on CT scan  # NOV 26th 2019- Faslodex + Piqaray 25079m; Jan 2020- Piqray 300 mg+ faslodex; FEB 17th CT scan- mixed response/ Overall progression; STOP Piqray + faslodex.   # FEB 25th 2020- ERIBULIN; Poor tolerance sec to severe neutropenia.  Stopped May 2020.  CT scan May 2020-new/progressive 2 cm liver lesion;   #July nd 2020-Ixempra x5 cycles-; NOV 2020-MRI liver- worsening liver lesions [rising  AST/ALT].   # NOV [id number] weekly [consent-]; December 31-2020-progression/rising LFTs.  Stop Taxol  #January 15th, 2021-Adriamycin weekly [C]; MARCH 2021- Partial response. STOP ADRIAMYCIN sec to EF drop 40-45%.   # April 2nd-2021-gemcitabine; 01/15/2020-GEM [800 mg/m2; #3 cycle 1000m76m] q 2 W; with onpro.   #May 06, 2019-MRI brain 5 new lesions [3 mm to 10 mm]-asymptomatic; [previous whole brain radiation]- OCT 2020- Re-Irradiation.   # PN- G-2; may 2017- Cymbalta 60mg57mARCH 2018-  acute vascular insuff of BIL LE [? Taxotere vasoconstriction on asprin]; # hemorragic shock LGIB [s/p colo; Dr.Wohl]  #  SVT- on flecainide.   # April 2016- Liver Bx- NEG;   # Drop in EF from Herceptin [recovered OCt 27th- EF 65%]; March 2020 ejection fraction 40 to 45%.  Stop Adriamycin.  # BMD- jan 2017- osteopenia; #Gait instability/visual disturbance-[Dr.Shah]- S/p EEG [5/13]- negative for seizures.  # 2020- SEP-s/p Pallaitive care evaluation  -----------------------------------------------------------     MOLECULAR TESTING- [facility]- Sep 4th 2018- PDL-1 0%/NEG for BRCA; MSI-Stable; Positive for PI3K; ccnd-1; FGF** -----------------------------------------------------------  Dx: Metastatic Breast cancer [ER/PR-Pos; her -2 NEG] Stage IV; goals: palliative Current treatment-gemcitabine chemotherapy [C]   Carcinoma of upper-outer quadrant of left breast in female, estrogen receptor positive (HCC) [place]25/2020 - 01/17/2019 Chemotherapy   The patient had eriBULin mesylate (HALAVEN) 2.1 mg in sodium chloride 0.9 % 100 mL chemo infusion, 1.2 mg/m2 = 2.1 mg (100 % of original dose 1.2 mg/m2), Intravenous,  Once, 3 of 4 cycles Dose modification: 1.2 mg/m2 (original dose 1.2 mg/m2, Cycle 1, Reason: Provider Judgment), 0.8 mg/m2 (original  dose 1.2 mg/m2, Cycle 3, Reason: Provider Judgment) Administration: 2.1 mg (11/22/2018), 2.1 mg (11/29/2018), 2.1 mg (12/20/2018), 1.4 mg (01/17/2019)  for  chemotherapy treatment.    03/30/2019 - 07/12/2019 Chemotherapy   The patient had pegfilgrastim (NEULASTA ONPRO KIT) injection 6 mg, 6 mg, Subcutaneous, Once, 5 of 8 cycles Administration: 6 mg (03/30/2019), 6 mg (04/20/2019), 6 mg (05/11/2019), 6 mg (06/22/2019), 6 mg (06/01/2019) ixabepilone (IXEMPRA) 60 mg in lactated ringers 250 mL chemo infusion, 33.5 mg/m2 = 57 mg (100 % of original dose 32 mg/m2), Intravenous,  Once, 5 of 8 cycles Dose modification: 32 mg/m2 (original dose 32 mg/m2, Cycle 1, Reason: Provider Judgment) Administration: 72 mg (04/20/2019), 72 mg (05/11/2019), 72 mg (06/22/2019), 72 mg (06/01/2019)  for chemotherapy treatment.    08/18/2019 - 09/28/2019 Chemotherapy   The patient had PACLitaxel (TAXOL) 150 mg in sodium chloride 0.9 % 250 mL chemo infusion (</= 3m/m2), 80 mg/m2 = 150 mg, Intravenous,  Once, 1 of 4 cycles Administration: 150 mg (08/18/2019), 150 mg (09/08/2019), 150 mg (09/15/2019)  for chemotherapy treatment.    10/13/2019 - 11/30/2019 Chemotherapy   The patient had DOXOrubicin (ADRIAMYCIN) chemo injection 38 mg, 20 mg/m2 = 38 mg, Intravenous,  Once, 6 of 9 cycles Administration: 38 mg (10/13/2019), 38 mg (10/20/2019), 38 mg (10/27/2019), 38 mg (11/17/2019), 38 mg (11/24/2019), 38 mg (11/10/2019) palonosetron (ALOXI) injection 0.25 mg, 0.25 mg, Intravenous,  Once, 6 of 9 cycles Administration: 0.25 mg (10/13/2019), 0.25 mg (10/20/2019), 0.25 mg (10/27/2019), 0.25 mg (11/17/2019), 0.25 mg (11/24/2019), 0.25 mg (11/10/2019)  for chemotherapy treatment.    12/29/2019 -  Chemotherapy   The patient had pegfilgrastim (NEULASTA ONPRO KIT) injection 6 mg, 6 mg, Subcutaneous, Once, 10 of 18 cycles Administration: 6 mg (01/15/2020), 6 mg (01/29/2020), 6 mg (02/12/2020), 6 mg (02/28/2020), 6 mg (03/14/2020), 6 mg (03/28/2020), 6 mg (04/11/2020), 6 mg (04/25/2020), 6 mg (05/09/2020) gemcitabine (GEMZAR) 1,444 mg in sodium chloride 0.9 % 250 mL chemo infusion, 800 mg/m2 = 1,444 mg, Intravenous,  Once, 11 of  19 cycles Dose modification: 1,000 mg/m2 (original dose 800 mg/m2, Cycle 3, Reason: Provider Judgment) Administration: 1,444 mg (12/29/2019), 1,444 mg (01/15/2020), 1,800 mg (01/29/2020), 1,800 mg (02/12/2020), 1,800 mg (02/28/2020), 1,800 mg (03/14/2020), 1,800 mg (03/28/2020), 1,800 mg (04/11/2020), 1,800 mg (04/25/2020), 1,800 mg (05/09/2020)  for chemotherapy treatment.      INTERVAL HISTORY:  Sarah Horne [id number].o.  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  Patient states that she had been eating better.  Gaining weight about 10 pounds in the last 2 weeks.  She has met with dietitian.  She continues to have gait instability.  No falls.  However complains of worsening pain in the foot- "wart".   Otherwise no worsening nausea vomiting.  Chronic fatigue.  Review of Systems  Constitutional: Positive for malaise/fatigue. Negative for chills, diaphoresis and fever.  HENT: Negative for nosebleeds and sore throat.   Eyes: Negative for double vision.  Respiratory: Negative for cough, hemoptysis, sputum production, shortness of breath and wheezing.   Cardiovascular: Negative for chest pain, palpitations, orthopnea and leg swelling.  Gastrointestinal: Positive for constipation. Negative for abdominal pain, blood in stool, heartburn, melena, nausea and vomiting.  Genitourinary: Negative for dysuria, frequency and urgency.  Musculoskeletal: Positive for joint pain. Negative for back pain.  Skin: Negative for itching.  Neurological: Positive for dizziness and tingling. Negative for focal weakness, weakness and headaches.  Endo/Heme/Allergies: Does not bruise/bleed easily.  Psychiatric/Behavioral: Negative for depression. The patient is not nervous/anxious and does not have insomnia.     PAST MEDICAL HISTORY :  Past Medical History:  Diagnosis Date  . Chemotherapy-induced  peripheral neuropathy ([place])   . Epistaxis    a. 11/2016 in setting of asa/plavix-->silver nitrate cauterization.  . GI bleed    a. 11/2016 Admission w/ GIB and hypovolemic shock req 3u PRBC's;  b. 11/2016 ECG: gastritis & nonbleeding peptic ulcer; c. 11/2016 Conlonoscopy: rectal and sigmoid colonic ulcers.  . Heart attack ([place])    a. 1998 Cath @ [facility]: reportedly no intervention required.  Marland Kitchen Herceptin-induced cardiomyopathy ([place])    a. In the setting of Herceptin Rx for breast cancer (initiated 12/2014); b. 03/2015 MUGA EF 64%; b. 08/2015 MUGA: EF 51%; c. 10/2015 MUGA: EF 44%; d. 11/2015 Echo: EF 45-50%; e. 01/2016 MUGA: EF 60%; f. 06/2016 MUGA EF 65%; g. 10/2016 MUGA: EF 61%;  h. 12/2016 Echo: EF 55-60%, gr1 DD.  Marland Kitchen Hyperlipidemia   . Hypertension   . Neuropathy   . Possible PAD (peripheral artery disease) ([place])    a. 11/2016 LE cyanosis and weak pulses-->CTA w/o significant Ao-BiFem dzs. ? distal dzs-->ASA/Plavix initiated by vascular surgery.  Marland Kitchen PSVT (paroxysmal supraventricular tachycardia) ([place])    a. Dx 11/2016.  . Pulmonary embolism ([place])    a. 12/2016 CTA Chest: small nonocclusive PE in inferior segment of the Left lingula, somewhat eccentric filling defect suggesting chronic rather than acute embolic event; b. 11/2669 LE U/S:  No DVT; c. 12/2016 Echo: Nl RV fxn, nl PASP.  Marland Kitchen Recurrent Metastatic breast cancer ([place])    a. Dx 2016: Stage II, ER positive, PR positive, HER-2/neu overexpressing of the left breast-->chemo/radiation; b. 10/2016 CT Abd/pelvis: diffuse liver mets, ill defined sclerotic bone lesions-T12;  c. 10/2016 MRI brain: metastatic lesion along L temporal lobe (19x59m) w/ extensive surrounding edema & 562mmidline shift to right.  . Sepsis ([place]  . Sinus tachycardia     PAST SURGICAL HISTORY :   Past Surgical History:  Procedure Laterality Date  . BREAST BIOPSY Left 2016   Positive  . BREAST LUMPECTOMY WITH SENTINEL LYMPH NODE BIOPSY Left 05/23/2015   Procedure: LEFT BREAST WIDE  EXCISION WITH AXILLARY DISSECTION, MASTOPLASTY ;  Surgeon: JeRobert BellowMD;  Location: [facility] ORS;  Service: General;  Laterality: Left;  . BREAST SURGERY Left 12/18/14   breast biopsy/INVASIVE DUCTAL CARCINOMA OF BREAST, NOTTINGHAM GRADE 2.  . Marland KitchenREAST SURGERY  05/23/2015.   Wide excision/mastoplasty, axillary dissection. No residual invasive cancer, positive for residual DCIS. 0/2 nodes identified on axillary dissection. (no SLN by technetium or methylene blue)  . CARDIAC CATHETERIZATION    . COLONOSCOPY WITH PROPOFOL N/A 12/10/2016   Procedure: COLONOSCOPY WITH PROPOFOL;  Surgeon: DaLucilla LameMD;  Location: [facility] ENDOSCOPY;  Service: Endoscopy;  Laterality: N/A;  . COLONOSCOPY WITH PROPOFOL N/A 02/13/2017   Procedure: COLONOSCOPY WITH PROPOFOL;  Surgeon: WoLucilla LameMD;  [facility];  Service: Endoscopy;  Laterality: N/A;  . ESOPHAGOGASTRODUODENOSCOPY N/A 11/03/2019   Procedure: ESOPHAGOGASTRODUODENOSCOPY (EGD);  Surgeon: VaLin LandsmanMD;  [facility];  Service: Gastroenterology;  Laterality: N/A;  . ESOPHAGOGASTRODUODENOSCOPY (EGD) WITH PROPOFOL N/A 12/08/2016   Procedure: ESOPHAGOGASTRODUODENOSCOPY (EGD) WITH PROPOFOL;  Surgeon: DaLucilla LameMD;  Location: [facility] END[facility];  Service: Endoscopy;  Laterality: N/A;  . IVC FILTER INSERTION N/A 02/15/2017   Procedure: IVC Filter Insertion;  Surgeon: DeAlgernon HuxleyMD;  [facility];  Service: Cardiovascular;  Laterality: N/A;  . PORTACATH PLACEMENT Right 12-31-14   Dr Bary Castilla    FAMILY HISTORY :   Family History  Problem Relation Age of Onset  . Breast cancer Maternal Aunt   . Breast cancer Cousin   . Brain cancer Maternal Uncle   . Diabetes Mother   . Hypertension Mother   . Stroke Mother     SOCIAL HISTORY:   Social History   Tobacco Use  . Smoking status: Light Tobacco Smoker    Packs/day: 0.25    Years: 18.00    Pack years: 4.50    Types: Cigarettes  . Smokeless tobacco: Never  Used  Vaping Use  . Vaping Use: Never used  Substance Use Topics  . Alcohol use: No    Alcohol/week: 0.0 standard drinks  . Drug use: No    ALLERGIES:  is allergic to no known allergies.  MEDICATIONS:  Current Outpatient Medications  Medication Sig Dispense Refill  . acetaminophen (TYLENOL) 650 MG CR tablet Take 650 mg by mouth every 8 (eight) hours as needed for pain.    Marland Kitchen apixaban (ELIQUIS) 5 MG TABS tablet Take 1 tablet (5 mg total) by mouth 2 (two) times daily. 60 tablet 4  . flecainide (TAMBOCOR) 50 MG tablet Take 1 tablet (50 mg total) by mouth 2 (two) times daily. 60 tablet 4  . levETIRAcetam (KEPPRA) 250 MG tablet Take 1 tablet by mouth 2 (two) times daily.     . magnesium oxide (MAG-OX) 400 MG tablet TAKE 1 TABLET BY MOUTH TWICE A DAY 60 tablet 4  . meclizine (ANTIVERT) 25 MG tablet Take 1 tablet (25 mg total) by mouth 3 (three) times daily as needed for dizziness. 30 tablet 0  . metoprolol tartrate (LOPRESSOR) 25 MG tablet TAKE 1 TABLET BY MOUTH TWICE A DAY 60 tablet 1  . montelukast (SINGULAIR) 10 MG tablet Take 1 tablet (10 mg total) by mouth at bedtime. 60 tablet 3  . ondansetron (ZOFRAN) 8 MG tablet One pill every 8 hours as needed for nausea/vomitting. 40 tablet 1  . pregabalin (LYRICA) 100 MG capsule TAKE 1 CAPSULE BY MOUTH TWICE A DAY 60 capsule 2  . prochlorperazine (COMPAZINE) 10 MG tablet Take 1 tablet (10 mg total) by mouth every 6 (six) hours as needed for nausea or vomiting. 40 tablet 1  . Vitamin D, Ergocalciferol, (DRISDOL) 1.25 MG (50000 UNIT) CAPS capsule TAKE 1 CAPSULE (50,000 UNITS TOTAL) ONCE A WEEK BY MOUTH. 12 capsule 1   No current facility-administered medications for this visit.   Facility-Administered Medications Ordered in Other Visits  Medication Dose Route Frequency Provider Last Rate Last Admin  . 0.9 %  sodium chloride infusion   Intravenous Continuous [name], MD   New Bag at 12/10/16 1140  . 0.9 %  sodium chloride infusion    Intravenous Continuous Lloyd Huger, MD 999 mL/hr at 04/24/15 1520 New Bag at 05/23/15 0951  . 0.9 %  sodium chloride infusion   Intravenous Continuous Verlon Au, NP 999 mL/hr at 10/31/19 1522 New Bag at 10/31/19 1522  . 0.9 %  sodium chloride infusion   Intravenous Once Charlaine Dalton R, MD      . gemcitabine (GEMZAR) 1,786 mg in sodium chloride 0.9 % 250 mL chemo infusion  1,000 mg/m2 (Treatment Plan Recorded) Intravenous Once Charlaine Dalton R, MD      . heparin lock flush 100 unit/mL  500 Units Intravenous Once Roxana Hires, MD      .  heparin lock flush 100 unit/mL  500 Units Intravenous Once Charlaine Dalton R, MD      . pegfilgrastim (NEULASTA ONPRO KIT) injection 6 mg  6 mg Subcutaneous Once Charlaine Dalton R, MD      . sodium chloride 0.9 % injection 10 mL  10 mL Intravenous PRN [name], MD   10 mL at 04/04/15 1440  . sodium chloride flush (NS) 0.9 % injection 10 mL  10 mL Intravenous PRN Berenzon, Dmitriy, MD      . sodium chloride flush (NS) 0.9 % injection 10 mL  10 mL Intravenous Once Cammie Sickle, MD        PHYSICAL EXAMINATION: ECOG PERFORMANCE STATUS: 1 - Symptomatic but completely ambulatory  BP 114/85 (BP Location: Right Arm, Patient Position: Sitting, Cuff Size: Normal)   Pulse 75   Temp 98.4 F (36.9 C) (Tympanic)   Resp 16   Ht _0  (1.676 m)   Wt 144 lb (65.3 kg)   SpO2 99%   BMI 23.24 kg/m   Filed Weights   05/23/20 0855  Weight: 144 lb (65.3 kg)    Physical Exam Constitutional:      Comments: Patient is alone.  In wheel chair.   HENT:     Head: Normocephalic and atraumatic.     Mouth/Throat:     Pharynx: No oropharyngeal exudate.  Eyes:     Pupils: Pupils are equal, round, and reactive to light.  Cardiovascular:     Rate and Rhythm: Normal rate and regular rhythm.  Pulmonary:     Effort: Pulmonary effort is normal. No respiratory distress.     Breath sounds: Normal breath sounds. No wheezing.   Abdominal:     General: Bowel sounds are normal. There is no distension.     Palpations: Abdomen is soft. There is no mass.     Tenderness: There is no abdominal tenderness. There is no guarding or rebound.  Musculoskeletal:        General: No tenderness. Normal range of motion.     Cervical back: Normal range of motion and neck supple.  Skin:    General: Skin is warm.     Comments: Bilateral foot plantar calluses noted.  Tender to palpation.  Neurological:     Mental Status: She is alert and oriented to person, place, and time.     Comments: Broad based gait.  Unsteady.  Psychiatric:        Mood and Affect: Affect normal.     LABORATORY DATA:  I have reviewed the data as listed    Component Value Date/Time   NA 140 05/23/2020 0837   NA 135 03/04/2017 0918   NA 135 01/08/2015 0850   K 3.8 05/23/2020 0837   K 3.7 01/08/2015 0850   CL 103 05/23/2020 0837   CL 101 01/08/2015 0850   CO2 27 05/23/2020 0837   CO2 26 01/08/2015 0850   GLUCOSE 86 05/23/2020 0837   GLUCOSE 161 (H) 01/08/2015 0850   BUN 19 05/23/2020 0837   BUN 8 03/04/2017 0918   BUN 17 01/08/2015 0850   CREATININE 0.82 05/23/2020 0837   CREATININE 0.82 01/22/2015 1559   CALCIUM 8.9 05/23/2020 0837   CALCIUM 9.3 01/08/2015 0850   PROT 7.1 05/23/2020 0837   PROT 6.4 03/04/2017 0918   PROT 6.8 01/22/2015 1559   ALBUMIN 3.8 05/23/2020 0837   ALBUMIN 2.5 (L) 03/04/2017 0918   ALBUMIN 3.9 01/22/2015 1559   AST 51 (H) 05/23/2020 0240  AST 34 01/22/2015 1559   ALT 40 05/23/2020 0837   ALT 42 01/22/2015 1559   ALKPHOS 393 (H) 05/23/2020 0837   ALKPHOS 157 (H) 01/22/2015 1559   BILITOT 1.0 05/23/2020 0837   BILITOT 6.2 (H) 03/04/2017 0918   BILITOT 0.2 (L) 01/22/2015 1559   GFRNONAA >60 05/23/2020 0837   GFRNONAA >60 01/22/2015 1559   GFRAA >60 05/23/2020 0837   GFRAA >60 01/22/2015 1559    No results found for: SPEP, UPEP  Lab Results  Component Value Date   WBC 4.4 05/23/2020   NEUTROABS 3.0  05/23/2020   HGB 14.1 05/23/2020   HCT 41.1 05/23/2020   MCV 94.7 05/23/2020   PLT 164 05/23/2020      Chemistry      Component Value Date/Time   NA 140 05/23/2020 0837   NA 135 03/04/2017 0918   NA 135 01/08/2015 0850   K 3.8 05/23/2020 0837   K 3.7 01/08/2015 0850   CL 103 05/23/2020 0837   CL 101 01/08/2015 0850   CO2 27 05/23/2020 0837   CO2 26 01/08/2015 0850   BUN 19 05/23/2020 0837   BUN 8 03/04/2017 0918   BUN 17 01/08/2015 0850   CREATININE 0.82 05/23/2020 0837   CREATININE 0.82 01/22/2015 1559      Component Value Date/Time   CALCIUM 8.9 05/23/2020 0837   CALCIUM 9.3 01/08/2015 0850   ALKPHOS 393 (H) 05/23/2020 0837   ALKPHOS 157 (H) 01/22/2015 1559   AST 51 (H) 05/23/2020 0837   AST 34 01/22/2015 1559   ALT 40 05/23/2020 0837   ALT 42 01/22/2015 1559   BILITOT 1.0 05/23/2020 0837   BILITOT 6.2 (H) 03/04/2017 0918   BILITOT 0.2 (L) 01/22/2015 1559       RADIOGRAPHIC STUDIES: I have personally reviewed the radiological images as listed and agreed with the findings in the report. No results found.   ASSESSMENT & PLAN:  Carcinoma of upper-outer quadrant of left breast in female, estrogen receptor positive ([place]) # Recurrent metastatic breast cancer-lung/liver metastases ER PR positive HER-2 negative; on Gemcitabine; June 29th CT improvement of LUL ~2.9 cm; Liver lesions- smaller. STABLE.  # proceed with gemcitabine #11 every 2 weeks[sec to neutropenia/on pro].  Labs today reviewed;  acceptable for treatment today. Will repeat scans in end of September.   #Gait instability/visual disturbance-[Dr.Shah]-clinically-S/p EEG [5/13]- negative for seizures; on keppra- STABLE.   # Plantar calluses-;new- recommend evaluation with podiatry.   # cardiomyopathy/ Sinus tachycardia- on metoprolol-/Tamabcor; [march 2d-echo- 40-45%]; STABLE.   # Elevated LFTs-likely secondary underlying cirrhosis/ chemo/underlying malignancy- STABLE; monitor for now.    # Peripheral  neuropathy grade 1-2-  on Lyrica- STABLE.   # weight loss/ poor appetite; sec to malignancy/ chemo-improved-continue ensure.    # Right lower extremity DVT bilateral [November 28, 2019] CT scan abdomen pelvis negative for any DVT.  On eliquis; STABLE.   # DISPOSITION:  # referral to podiatry re: plantar calluses # gemcitabine/onprpo today;  # 2 weeks MD; labs- cbc/cmp;GEM- onpro; -Dr.B     No orders of the defined types were placed in this encounter.  All questions were answered. The patient knows to call the clinic with any problems, questions or concerns.     Cammie Sickle, MD 05/23/2020 9:24 AM

## 2020-05-23 NOTE — Assessment & Plan Note (Addendum)
#  Recurrent metastatic breast cancer-lung/liver metastases ER PR positive HER-2 negative; on Gemcitabine; June 29th CT improvement of LUL ~2.9 cm; Liver lesions- smaller. STABLE.  # proceed with gemcitabine #11 every 2 weeks[sec to neutropenia/on pro].  Labs today reviewed;  acceptable for treatment today. Will repeat scans in end of September.   #Gait instability/visual disturbance-[Dr.Shah]-clinically-S/p EEG [5/13]- negative for seizures; on keppra- STABLE.   # Plantar calluses-;new- recommend evaluation with podiatry.   # cardiomyopathy/ Sinus tachycardia- on metoprolol-/Tamabcor; [march 2d-echo- 40-45%]; STABLE.   # Elevated LFTs-likely secondary underlying cirrhosis/ chemo/underlying malignancy- STABLE; monitor for now.    # Peripheral neuropathy grade 1-2-  on Lyrica- STABLE.   # weight loss/ poor appetite; sec to malignancy/ chemo-improved-continue ensure.    # Right lower extremity DVT bilateral [November 28, 2019] CT scan abdomen pelvis negative for any DVT.  On eliquis; STABLE.   # DISPOSITION:  # referral to podiatry re: plantar calluses # gemcitabine/onprpo today;  # 2 weeks MD; labs- cbc/cmp;GEM- onpro; -Dr.B

## 2020-05-23 NOTE — Progress Notes (Signed)
Patient did not answer when called.  Voicemail left.  Will reschedule.

## 2020-05-28 ENCOUNTER — Other Ambulatory Visit: Payer: Medicare HMO | Admitting: Nurse Practitioner

## 2020-05-28 ENCOUNTER — Telehealth: Payer: Self-pay | Admitting: Nurse Practitioner

## 2020-05-28 ENCOUNTER — Other Ambulatory Visit: Payer: Self-pay

## 2020-05-29 ENCOUNTER — Encounter: Payer: Self-pay | Admitting: Podiatry

## 2020-05-29 ENCOUNTER — Other Ambulatory Visit: Payer: Self-pay

## 2020-05-29 ENCOUNTER — Ambulatory Visit: Payer: Medicare HMO | Admitting: Podiatry

## 2020-05-29 DIAGNOSIS — D689 Coagulation defect, unspecified: Secondary | ICD-10-CM

## 2020-05-29 DIAGNOSIS — Q828 Other specified congenital malformations of skin: Secondary | ICD-10-CM | POA: Diagnosis not present

## 2020-05-29 DIAGNOSIS — M79676 Pain in unspecified toe(s): Secondary | ICD-10-CM

## 2020-05-29 DIAGNOSIS — B351 Tinea unguium: Secondary | ICD-10-CM | POA: Diagnosis not present

## 2020-05-29 NOTE — Progress Notes (Signed)
She presents today chief complaint of painfully elongated toenails with warts.  Objective: Vital signs are stable she alert oriented x3 pulses are barely palpable bilateral.  Skin is dry and xerotic.  She has 2 porokeratotic lesions to the plantar aspect of her foot bilaterally.  Toenails are long thick yellow dystrophic and clinically mycotic.  Assessment: Pain in limb secondary to onychomycosis and porokeratosis.  Plan: Debridement porokeratotic lesions debrided toenails 1 through 5 bilateral.

## 2020-05-30 ENCOUNTER — Telehealth: Payer: Self-pay

## 2020-05-30 NOTE — Telephone Encounter (Signed)
Nutrition  Patient called RD to state that she has gained weight.  Appetite is better and eating more.  Drinking ensure enlive 3 times daily.  Requesting more ensure.  Complimentary case of ensure enlive left at registration for patient to pick up.   RD will follow-up with patient on 9/9  Graydon Fofana B. Zenia Resides, [place], [place] Registered Dietitian [phone] (mobile)

## 2020-06-06 ENCOUNTER — Inpatient Hospital Stay: Payer: Medicare HMO

## 2020-06-06 ENCOUNTER — Inpatient Hospital Stay: Payer: Medicare HMO | Attending: Internal Medicine

## 2020-06-06 ENCOUNTER — Other Ambulatory Visit: Payer: Self-pay

## 2020-06-06 ENCOUNTER — Inpatient Hospital Stay: Payer: Medicare HMO | Admitting: Hospice and Palliative Medicine

## 2020-06-06 ENCOUNTER — Inpatient Hospital Stay (HOSPITAL_BASED_OUTPATIENT_CLINIC_OR_DEPARTMENT_OTHER): Payer: Medicare HMO | Admitting: Internal Medicine

## 2020-06-06 VITALS — BP 125/85 | HR 69 | Resp 16

## 2020-06-06 DIAGNOSIS — Z5111 Encounter for antineoplastic chemotherapy: Secondary | ICD-10-CM | POA: Insufficient documentation

## 2020-06-06 DIAGNOSIS — F17210 Nicotine dependence, cigarettes, uncomplicated: Secondary | ICD-10-CM | POA: Diagnosis not present

## 2020-06-06 DIAGNOSIS — Z803 Family history of malignant neoplasm of breast: Secondary | ICD-10-CM | POA: Insufficient documentation

## 2020-06-06 DIAGNOSIS — Z515 Encounter for palliative care: Secondary | ICD-10-CM

## 2020-06-06 DIAGNOSIS — Z8249 Family history of ischemic heart disease and other diseases of the circulatory system: Secondary | ICD-10-CM | POA: Insufficient documentation

## 2020-06-06 DIAGNOSIS — Z170 Estrogen receptor positive status [ER+]: Secondary | ICD-10-CM

## 2020-06-06 DIAGNOSIS — I10 Essential (primary) hypertension: Secondary | ICD-10-CM | POA: Insufficient documentation

## 2020-06-06 DIAGNOSIS — C50412 Malignant neoplasm of upper-outer quadrant of left female breast: Secondary | ICD-10-CM

## 2020-06-06 DIAGNOSIS — Z7189 Other specified counseling: Secondary | ICD-10-CM

## 2020-06-06 DIAGNOSIS — Z5189 Encounter for other specified aftercare: Secondary | ICD-10-CM | POA: Insufficient documentation

## 2020-06-06 DIAGNOSIS — Z833 Family history of diabetes mellitus: Secondary | ICD-10-CM | POA: Insufficient documentation

## 2020-06-06 DIAGNOSIS — E785 Hyperlipidemia, unspecified: Secondary | ICD-10-CM | POA: Insufficient documentation

## 2020-06-06 DIAGNOSIS — C7931 Secondary malignant neoplasm of brain: Secondary | ICD-10-CM | POA: Diagnosis not present

## 2020-06-06 DIAGNOSIS — T451X5A Adverse effect of antineoplastic and immunosuppressive drugs, initial encounter: Secondary | ICD-10-CM | POA: Diagnosis not present

## 2020-06-06 DIAGNOSIS — C7802 Secondary malignant neoplasm of left lung: Secondary | ICD-10-CM | POA: Diagnosis not present

## 2020-06-06 DIAGNOSIS — Z79899 Other long term (current) drug therapy: Secondary | ICD-10-CM | POA: Diagnosis not present

## 2020-06-06 DIAGNOSIS — Z86711 Personal history of pulmonary embolism: Secondary | ICD-10-CM | POA: Insufficient documentation

## 2020-06-06 DIAGNOSIS — I252 Old myocardial infarction: Secondary | ICD-10-CM | POA: Insufficient documentation

## 2020-06-06 DIAGNOSIS — C787 Secondary malignant neoplasm of liver and intrahepatic bile duct: Secondary | ICD-10-CM | POA: Diagnosis not present

## 2020-06-06 DIAGNOSIS — D701 Agranulocytosis secondary to cancer chemotherapy: Secondary | ICD-10-CM | POA: Insufficient documentation

## 2020-06-06 DIAGNOSIS — M8580 Other specified disorders of bone density and structure, unspecified site: Secondary | ICD-10-CM | POA: Diagnosis not present

## 2020-06-06 DIAGNOSIS — Z7902 Long term (current) use of antithrombotics/antiplatelets: Secondary | ICD-10-CM | POA: Insufficient documentation

## 2020-06-06 DIAGNOSIS — G629 Polyneuropathy, unspecified: Secondary | ICD-10-CM | POA: Insufficient documentation

## 2020-06-06 DIAGNOSIS — Z7901 Long term (current) use of anticoagulants: Secondary | ICD-10-CM | POA: Diagnosis not present

## 2020-06-06 LAB — CBC WITH DIFFERENTIAL/PLATELET
Abs Immature Granulocytes: 0.02 10*3/uL (ref 0.00–0.07)
Basophils Absolute: 0 10*3/uL (ref 0.0–0.1)
Basophils Relative: 0 %
Eosinophils Absolute: 0.1 10*3/uL (ref 0.0–0.5)
Eosinophils Relative: 1 %
HCT: 40.7 % (ref 36.0–46.0)
Hemoglobin: 13.9 g/dL (ref 12.0–15.0)
Immature Granulocytes: 0 %
Lymphocytes Relative: 12 %
Lymphs Abs: 0.7 10*3/uL (ref 0.7–4.0)
MCH: 32.6 pg (ref 26.0–34.0)
MCHC: 34.2 g/dL (ref 30.0–36.0)
MCV: 95.5 fL (ref 80.0–100.0)
Monocytes Absolute: 0.5 10*3/uL (ref 0.1–1.0)
Monocytes Relative: 8 %
Neutro Abs: 4.5 10*3/uL (ref 1.7–7.7)
Neutrophils Relative %: 79 %
Platelets: 162 10*3/uL (ref 150–400)
RBC: 4.26 MIL/uL (ref 3.87–5.11)
RDW: 15.3 % (ref 11.5–15.5)
WBC: 5.8 10*3/uL (ref 4.0–10.5)
nRBC: 0 % (ref 0.0–0.2)

## 2020-06-06 LAB — COMPREHENSIVE METABOLIC PANEL
ALT: 44 U/L (ref 0–44)
AST: 58 U/L — ABNORMAL HIGH (ref 15–41)
Albumin: 3.7 g/dL (ref 3.5–5.0)
Alkaline Phosphatase: 420 U/L — ABNORMAL HIGH (ref 38–126)
Anion gap: 11 (ref 5–15)
BUN: 18 mg/dL (ref 8–23)
CO2: 27 mmol/L (ref 22–32)
Calcium: 8.9 mg/dL (ref 8.9–10.3)
Chloride: 100 mmol/L (ref 98–111)
Creatinine, Ser: 0.69 mg/dL (ref 0.44–1.00)
GFR calc Af Amer: >60 mL/min (ref >60)
GFR calc non Af Amer: >60 mL/min (ref >60)
Glucose, Bld: 86 mg/dL (ref 70–99)
Potassium: 3.8 mmol/L (ref 3.5–5.1)
Sodium: 138 mmol/L (ref 135–145)
Total Bilirubin: 0.9 mg/dL (ref 0.3–1.2)
Total Protein: 7.1 g/dL (ref 6.5–8.1)

## 2020-06-06 MED ORDER — PEGFILGRASTIM 6 MG/0.6ML ~~LOC~~ PSKT
6.0000 mg | PREFILLED_SYRINGE | Freq: Once | SUBCUTANEOUS | Status: AC
  Administered 2020-06-06: 6 mg via SUBCUTANEOUS
  Filled 2020-06-06: qty 0.6

## 2020-06-06 MED ORDER — SODIUM CHLORIDE 0.9% FLUSH
10.0000 mL | Freq: Once | INTRAVENOUS | Status: AC
  Administered 2020-06-06: 10 mL via INTRAVENOUS
  Filled 2020-06-06: qty 10

## 2020-06-06 MED ORDER — SODIUM CHLORIDE 0.9 % IV SOLN
1800.0000 mg | Freq: Once | INTRAVENOUS | Status: AC
  Administered 2020-06-06: 1800 mg via INTRAVENOUS
  Filled 2020-06-06: qty 26.3

## 2020-06-06 MED ORDER — HEPARIN SOD (PORK) LOCK FLUSH 100 UNIT/ML IV SOLN
500.0000 [IU] | Freq: Once | INTRAVENOUS | Status: AC
  Administered 2020-06-06: 500 [IU] via INTRAVENOUS
  Filled 2020-06-06: qty 5

## 2020-06-06 MED ORDER — HEPARIN SOD (PORK) LOCK FLUSH 100 UNIT/ML IV SOLN
INTRAVENOUS | Status: AC
  Filled 2020-06-06: qty 5

## 2020-06-06 MED ORDER — PROCHLORPERAZINE MALEATE 10 MG PO TABS
10.0000 mg | ORAL_TABLET | Freq: Once | ORAL | Status: AC
  Administered 2020-06-06: 10 mg via ORAL
  Filled 2020-06-06: qty 1

## 2020-06-06 MED ORDER — SODIUM CHLORIDE 0.9 % IV SOLN
Freq: Once | INTRAVENOUS | Status: AC
  Filled 2020-06-06: qty 250

## 2020-06-06 NOTE — Progress Notes (Signed)
Was unable to reach patient for virtual visit.  Will reschedule.

## 2020-06-06 NOTE — Progress Notes (Signed)
Nutrition Follow-up:  Patient with metastatic breast cancer with leptomeniigeal disease to brain.  Patient receiving chemotherapy.   Spoke with patient during infusion.  Patient reports that appetite is better and disappointed that she lost weight.  "I have been eating better."  Patient has been drinking ensure shakes.    Medications: reviewed  Labs: reviewed  Anthropometrics:   Weight 136 lb 3.2 oz today decreased from 138 lb on 8/3 last cancer center visit   NUTRITION DIAGNOSIS: Inadequate oral intake continues   INTERVENTION:  Reviewed ways to add calories and protein to current eating pattern.  Handout given of foods to add that will increase calories.  Also snack ideas given. Encouraged oral nutrition supplements as tolerated    MONITORING, EVALUATION, GOAL: weight trends, intake   NEXT VISIT: October 7 during infusion  Keni Wafer B. Zenia Resides, [place], [place] Registered Dietitian [phone] (mobile)

## 2020-06-06 NOTE — Progress Notes (Signed)
[place] OFFICE PROGRESS NOTE  Patient Care Team: Delsa Grana, PA-C as PCP - General (Family Medicine) Wellington Hampshire, MD as PCP - Cardiology (Cardiology) Bary Castilla, Forest Gleason, MD (General Surgery) Vladimir Crofts, MD as Consulting Physician (Neurology) Cammie Sickle, MD as Consulting Physician (Internal Medicine)  Cancer Staging No matching staging information was found for the patient.   Oncology History Overview Note  # LEFT BREAST IDC; STAGE II [cT2N1] ER >90%; PR- 50-90%; her 2 NEU- POS; s/p Neoadj chemo; AUG 2016- TCH+P s/p Lumpec & partial ALND- path CR; s/p RT [finished Nov 2016];  adj Herceptin; HELD for Jan 19th 2017 [in DEC 2016-EF dropped from 63 to 51%; FEB 27th EF-42.9%]; JAN 2016 START Arimidex; May 2017- EF- 60%; May 24th 2017-Re-start Herceptin q 3W; July 28th STOP herceptin [finished 12m/m2; sec to Low EF; however 2017 Oct EF= 67%; improved]  # DEC 4th 2017- Start Neratinib 4 pills; DEC 11th 3 pills; STOPPED.  # FEB 2018- RECURRENCE ER/PR positive; Her 2 NEGATIVE [liver Bx]  # MARCH 1st 2018- Tax-Cytoxan [poor tol]  # FEB 2018- Brain mets [SBRT; Dr.Crystal; finished Feb 28th 2018]  # March 2018- Eribulin- poor tolerance  # June 8th 2018- Started Faslodex + Abema [abema-multiple interruptions sec to neutropenia]  # MRI Brain- March 2019- Leptomeningeal mets [WBRT; No LP s/p WBRT- 01/09/2018]; STOP Abema [577mday-sec to severe cytopenia]+faslodex  # MAY 20tth 019- Start Xeloda; STOPPED in Nov 22nd 2019- sec to Progressive liver lesions on CT scan  # NOV 26th 2019- Faslodex + Piqaray 25058m; Jan 2020- Piqray 300 mg+ faslodex; FEB 17th CT scan- mixed response/ Overall progression; STOP Piqray + faslodex.   # FEB 25th 2020- ERIBULIN; Poor tolerance sec to severe neutropenia.  Stopped May 2020.  CT scan May 2020-new/progressive 2 cm liver lesion;   #July nd 2020-Ixempra x5 cycles-; NOV 2020-MRI liver- worsening liver lesions [rising  AST/ALT].   # NOV [id number] weekly [consent-]; December 31-2020-progression/rising LFTs.  Stop Taxol  #January 15th, 2021-Adriamycin weekly [C]; MARCH 2021- Partial response. STOP ADRIAMYCIN sec to EF drop 40-45%.   # April 2nd-2021-gemcitabine; 01/15/2020-GEM [800 mg/m2; #3 cycle 1000m7m] q 2 W; with onpro.   #May 06, 2019-MRI brain 5 new lesions [3 mm to 10 mm]-asymptomatic; [previous whole brain radiation]- OCT 2020- Re-Irradiation.   # PN- G-2; may 2017- Cymbalta 60mg32mARCH 2018-  acute vascular insuff of BIL LE [? Taxotere vasoconstriction on asprin]; # hemorragic shock LGIB [s/p colo; Dr.Wohl]  #  SVT- on flecainide.   # April 2016- Liver Bx- NEG;   # Drop in EF from Herceptin [recovered OCt 27th- EF 65%]; March 2020 ejection fraction 40 to 45%.  Stop Adriamycin.  # BMD- jan 2017- osteopenia; #Gait instability/visual disturbance-[Dr.Shah]- S/p EEG [5/13]- negative for seizures.  # 2020- SEP-s/p Pallaitive care evaluation  -----------------------------------------------------------     MOLECULAR TESTING- [facility]- Sep 4th 2018- PDL-1 0%/NEG for BRCA; MSI-Stable; Positive for PI3K; ccnd-1; FGF** -----------------------------------------------------------  Dx: Metastatic Breast cancer [ER/PR-Pos; her -2 NEG] Stage IV; goals: palliative Current treatment-gemcitabine chemotherapy [C]   Carcinoma of upper-outer quadrant of left breast in female, estrogen receptor positive (HCC) [place]25/2020 - 01/17/2019 Chemotherapy   The patient had eriBULin mesylate (HALAVEN) 2.1 mg in sodium chloride 0.9 % 100 mL chemo infusion, 1.2 mg/m2 = 2.1 mg (100 % of original dose 1.2 mg/m2), Intravenous,  Once, 3 of 4 cycles Dose modification: 1.2 mg/m2 (original dose 1.2 mg/m2, Cycle 1, Reason: Provider Judgment), 0.8 mg/m2 (original  dose 1.2 mg/m2, Cycle 3, Reason: Provider Judgment) Administration: 2.1 mg (11/22/2018), 2.1 mg (11/29/2018), 2.1 mg (12/20/2018), 1.4 mg (01/17/2019)  for  chemotherapy treatment.    03/30/2019 - 07/12/2019 Chemotherapy   The patient had pegfilgrastim (NEULASTA ONPRO KIT) injection 6 mg, 6 mg, Subcutaneous, Once, 5 of 8 cycles Administration: 6 mg (03/30/2019), 6 mg (04/20/2019), 6 mg (05/11/2019), 6 mg (06/22/2019), 6 mg (06/01/2019) ixabepilone (IXEMPRA) 60 mg in lactated ringers 250 mL chemo infusion, 33.5 mg/m2 = 57 mg (100 % of original dose 32 mg/m2), Intravenous,  Once, 5 of 8 cycles Dose modification: 32 mg/m2 (original dose 32 mg/m2, Cycle 1, Reason: Provider Judgment) Administration: 72 mg (04/20/2019), 72 mg (05/11/2019), 72 mg (06/22/2019), 72 mg (06/01/2019)  for chemotherapy treatment.    08/18/2019 - 09/28/2019 Chemotherapy   The patient had PACLitaxel (TAXOL) 150 mg in sodium chloride 0.9 % 250 mL chemo infusion (</= 49m/m2), 80 mg/m2 = 150 mg, Intravenous,  Once, 1 of 4 cycles Administration: 150 mg (08/18/2019), 150 mg (09/08/2019), 150 mg (09/15/2019)  for chemotherapy treatment.    10/13/2019 - 11/30/2019 Chemotherapy   The patient had DOXOrubicin (ADRIAMYCIN) chemo injection 38 mg, 20 mg/m2 = 38 mg, Intravenous,  Once, 6 of 9 cycles Administration: 38 mg (10/13/2019), 38 mg (10/20/2019), 38 mg (10/27/2019), 38 mg (11/17/2019), 38 mg (11/24/2019), 38 mg (11/10/2019) palonosetron (ALOXI) injection 0.25 mg, 0.25 mg, Intravenous,  Once, 6 of 9 cycles Administration: 0.25 mg (10/13/2019), 0.25 mg (10/20/2019), 0.25 mg (10/27/2019), 0.25 mg (11/17/2019), 0.25 mg (11/24/2019), 0.25 mg (11/10/2019)  for chemotherapy treatment.    12/29/2019 -  Chemotherapy   The patient had pegfilgrastim (NEULASTA ONPRO KIT) injection 6 mg, 6 mg, Subcutaneous, Once, 11 of 18 cycles Administration: 6 mg (01/15/2020), 6 mg (01/29/2020), 6 mg (02/12/2020), 6 mg (02/28/2020), 6 mg (03/14/2020), 6 mg (03/28/2020), 6 mg (04/11/2020), 6 mg (04/25/2020), 6 mg (05/09/2020), 6 mg (05/23/2020) gemcitabine (GEMZAR) 1,444 mg in sodium chloride 0.9 % 250 mL chemo infusion, 800 mg/m2 = 1,444 mg,  Intravenous,  Once, 12 of 19 cycles Dose modification: 1,000 mg/m2 (original dose 800 mg/m2, Cycle 3, Reason: Provider Judgment) Administration: 1,444 mg (12/29/2019), 1,444 mg (01/15/2020), 1,800 mg (01/29/2020), 1,800 mg (02/12/2020), 1,800 mg (02/28/2020), 1,800 mg (03/14/2020), 1,800 mg (03/28/2020), 1,800 mg (04/11/2020), 1,800 mg (04/25/2020), 1,800 mg (05/09/2020), 1,800 mg (05/23/2020)  for chemotherapy treatment.      INTERVAL HISTORY:  BRAINE BLODGETT89y.o.  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  In the interim patient has been evaluated by podiatry-had calluses shaved off.  Pain improved.  Patient continues to complain of gait instability.  Denies any falls.  Denies any seizures.  Patient is disappointed about the weight loss today.  States that she will do better next visit.  Complains of chronic mild nausea.  No vomiting.  Appetite is fair.  Review of Systems  Constitutional: Positive for malaise/fatigue. Negative for chills, diaphoresis and fever.  HENT: Negative for nosebleeds and sore throat.   Eyes: Negative for double vision.  Respiratory: Negative for cough, hemoptysis, sputum production, shortness of breath and wheezing.   Cardiovascular: Negative for chest pain, palpitations, orthopnea and leg swelling.  Gastrointestinal: Positive for constipation. Negative for abdominal pain, blood in stool, heartburn, melena, nausea and vomiting.  Genitourinary: Negative for dysuria, frequency and urgency.  Musculoskeletal: Positive for joint pain. Negative for back pain.  Skin: Negative for itching.  Neurological: Positive for dizziness and tingling. Negative for focal weakness, weakness and headaches.  Endo/Heme/Allergies: Does not bruise/bleed easily.  Psychiatric/Behavioral: Negative for depression. The patient is not nervous/anxious and does not have  insomnia.     PAST MEDICAL HISTORY :  Past Medical History:  Diagnosis Date  . Chemotherapy-induced peripheral neuropathy ([place])   . Epistaxis    a. 11/2016 in setting of asa/plavix-->silver nitrate cauterization.  . GI bleed    a. 11/2016 Admission w/ GIB and hypovolemic shock req 3u PRBC's;  b. 11/2016 ECG: gastritis & nonbleeding peptic ulcer; c. 11/2016 Conlonoscopy: rectal and sigmoid colonic ulcers.  . Heart attack ([place])    a. 1998 Cath @ [facility]: reportedly no intervention required.  Marland Kitchen Herceptin-induced cardiomyopathy ([place])    a. In the setting of Herceptin Rx for breast cancer (initiated 12/2014); b. 03/2015 MUGA EF 64%; b. 08/2015 MUGA: EF 51%; c. 10/2015 MUGA: EF 44%; d. 11/2015 Echo: EF 45-50%; e. 01/2016 MUGA: EF 60%; f. 06/2016 MUGA EF 65%; g. 10/2016 MUGA: EF 61%;  h. 12/2016 Echo: EF 55-60%, gr1 DD.  Marland Kitchen Hyperlipidemia   . Hypertension   . Neuropathy   . Possible PAD (peripheral artery disease) ([place])    a. 11/2016 LE cyanosis and weak pulses-->CTA w/o significant Ao-BiFem dzs. ? distal dzs-->ASA/Plavix initiated by vascular surgery.  Marland Kitchen PSVT (paroxysmal supraventricular tachycardia) ([place])    a. Dx 11/2016.  . Pulmonary embolism ([place])    a. 12/2016 CTA Chest: small nonocclusive PE in inferior segment of the Left lingula, somewhat eccentric filling defect suggesting chronic rather than acute embolic event; b. 10/6832 LE U/S:  No DVT; c. 12/2016 Echo: Nl RV fxn, nl PASP.  Marland Kitchen Recurrent Metastatic breast cancer ([place])    a. Dx 2016: Stage II, ER positive, PR positive, HER-2/neu overexpressing of the left breast-->chemo/radiation; b. 10/2016 CT Abd/pelvis: diffuse liver mets, ill defined sclerotic bone lesions-T12;  c. 10/2016 MRI brain: metastatic lesion along L temporal lobe (19x13m) w/ extensive surrounding edema & 560mmidline shift to right.  . [place]  . Sinus tachycardia     PAST SURGICAL HISTORY :   Past Surgical History:  Procedure Laterality Date  . BREAST BIOPSY Left 2016   Positive   . BREAST LUMPECTOMY WITH SENTINEL LYMPH NODE BIOPSY Left 05/23/2015   Procedure: LEFT BREAST WIDE EXCISION WITH AXILLARY DISSECTION, MASTOPLASTY ;  Surgeon: JeRobert BellowMD;  Location: [facility] ORS;  Service: General;  Laterality: Left;  . BREAST SURGERY Left 12/18/14   breast biopsy/INVASIVE DUCTAL CARCINOMA OF BREAST, NOTTINGHAM GRADE 2.  . Marland KitchenREAST SURGERY  05/23/2015.   Wide excision/mastoplasty, axillary dissection. No residual invasive cancer, positive for residual DCIS. 0/2 nodes identified on axillary dissection. (no SLN by technetium or methylene blue)  . CARDIAC CATHETERIZATION    . COLONOSCOPY WITH PROPOFOL N/A 12/10/2016   Procedure: COLONOSCOPY WITH PROPOFOL;  Surgeon: DaLucilla LameMD;  Location: [facility] ENDOSCOPY;  Service: Endoscopy;  Laterality: N/A;  . COLONOSCOPY WITH PROPOFOL N/A 02/13/2017   Procedure: COLONOSCOPY WITH PROPOFOL;  Surgeon: WoLucilla LameMD;  [facility];  Service: Endoscopy;  Laterality: N/A;  . ESOPHAGOGASTRODUODENOSCOPY N/A 11/03/2019   Procedure: ESOPHAGOGASTRODUODENOSCOPY (EGD);  Surgeon: VaLin LandsmanMD;  [facility];  Service: Gastroenterology;  Laterality: N/A;  . ESOPHAGOGASTRODUODENOSCOPY (EGD) WITH PROPOFOL N/A 12/08/2016   Procedure: ESOPHAGOGASTRODUODENOSCOPY (EGD) WITH PROPOFOL;  Surgeon: DaLucilla LameMD;  Location: [facility] ENDOSCOPY;  Service: Endoscopy;  Laterality: N/A;  . IVC FILTER INSERTION N/A 02/15/2017   Procedure:  IVC Filter Insertion;  Surgeon: Algernon Huxley, MD;  Location: [facility];  Service: Cardiovascular;  Laterality: N/A;  . PORTACATH PLACEMENT Right 12-31-14   Dr Bary Castilla    FAMILY HISTORY :   Family History  Problem Relation Age of Onset  . Breast cancer Maternal Aunt   . Breast cancer Cousin   . Brain cancer Maternal Uncle   . Diabetes Mother   . Hypertension Mother   . Stroke Mother     SOCIAL HISTORY:   Social History   Tobacco Use  . Smoking status: Light Tobacco Smoker     Packs/day: 0.25    Years: 18.00    Pack years: 4.50    Types: Cigarettes  . Smokeless tobacco: Never Used  Vaping Use  . Vaping Use: Never used  Substance Use Topics  . Alcohol use: No    Alcohol/week: 0.0 standard drinks  . Drug use: No    ALLERGIES:  is allergic to no known allergies.  MEDICATIONS:  Current Outpatient Medications  Medication Sig Dispense Refill  . acetaminophen (TYLENOL) 650 MG CR tablet Take 650 mg by mouth every 8 (eight) hours as needed for pain.    Marland Kitchen apixaban (ELIQUIS) 5 MG TABS tablet Take 1 tablet (5 mg total) by mouth 2 (two) times daily. 60 tablet 4  . flecainide (TAMBOCOR) 50 MG tablet Take 1 tablet (50 mg total) by mouth 2 (two) times daily. 60 tablet 4  . levETIRAcetam (KEPPRA) 250 MG tablet Take 1 tablet by mouth 2 (two) times daily.     . magnesium oxide (MAG-OX) 400 MG tablet TAKE 1 TABLET BY MOUTH TWICE A DAY 60 tablet 4  . meclizine (ANTIVERT) 25 MG tablet Take 1 tablet (25 mg total) by mouth 3 (three) times daily as needed for dizziness. 30 tablet 0  . metoprolol tartrate (LOPRESSOR) 25 MG tablet TAKE 1 TABLET BY MOUTH TWICE A DAY 60 tablet 1  . montelukast (SINGULAIR) 10 MG tablet Take 1 tablet (10 mg total) by mouth at bedtime. 60 tablet 3  . ondansetron (ZOFRAN) 8 MG tablet One pill every 8 hours as needed for nausea/vomitting. 40 tablet 1  . pregabalin (LYRICA) 100 MG capsule TAKE 1 CAPSULE BY MOUTH TWICE A DAY 60 capsule 2  . prochlorperazine (COMPAZINE) 10 MG tablet Take 1 tablet (10 mg total) by mouth every 6 (six) hours as needed for nausea or vomiting. 40 tablet 1  . Vitamin D, Ergocalciferol, (DRISDOL) 1.25 MG (50000 UNIT) CAPS capsule TAKE 1 CAPSULE (50,000 UNITS TOTAL) ONCE A WEEK BY MOUTH. 12 capsule 1   No current facility-administered medications for this visit.   Facility-Administered Medications Ordered in Other Visits  Medication Dose Route Frequency Provider Last Rate Last Admin  . 0.9 %  sodium chloride infusion   Intravenous  Continuous [name], MD   New Bag at 12/10/16 1140  . 0.9 %  sodium chloride infusion   Intravenous Continuous Lloyd Huger, MD 999 mL/hr at 04/24/15 1520 New Bag at 05/23/15 0951  . 0.9 %  sodium chloride infusion   Intravenous Continuous Verlon Au, NP 999 mL/hr at 10/31/19 1522 New Bag at 10/31/19 1522  . 0.9 %  sodium chloride infusion   Intravenous Once Charlaine Dalton R, MD      . gemcitabine (GEMZAR) 1,786 mg in sodium chloride 0.9 % 250 mL chemo infusion  1,000 mg/m2 (Treatment Plan Recorded) Intravenous Once Cammie Sickle, MD      . heparin  lock flush 100 unit/mL  500 Units Intravenous Once Berenzon, Dmitriy, MD      . heparin lock flush 100 unit/mL  500 Units Intravenous Once Charlaine Dalton R, MD      . pegfilgrastim (NEULASTA ONPRO KIT) injection 6 mg  6 mg Subcutaneous Once Charlaine Dalton R, MD      . prochlorperazine (COMPAZINE) tablet 10 mg  10 mg Oral Once Charlaine Dalton R, MD      . sodium chloride 0.9 % injection 10 mL  10 mL Intravenous PRN [name], MD   10 mL at 04/04/15 1440  . sodium chloride flush (NS) 0.9 % injection 10 mL  10 mL Intravenous PRN Berenzon, Dmitriy, MD        PHYSICAL EXAMINATION: ECOG PERFORMANCE STATUS: 1 - Symptomatic but completely ambulatory  BP 107/79 (BP Location: Right Arm, Patient Position: Sitting)   Pulse 95   Temp 98.5 F (36.9 C) (Tympanic)   Wt 136 lb 3.2 oz (61.8 kg)   SpO2 99%   BMI 21.98 kg/m   Filed Weights   06/06/20 0855  Weight: 136 lb 3.2 oz (61.8 kg)    Physical Exam Constitutional:      Comments: Patient is alone.  In wheel chair.   HENT:     Head: Normocephalic and atraumatic.     Mouth/Throat:     Pharynx: No oropharyngeal exudate.  Eyes:     Pupils: Pupils are equal, round, and reactive to light.  Cardiovascular:     Rate and Rhythm: Normal rate and regular rhythm.  Pulmonary:     Effort: Pulmonary effort is normal. No respiratory distress.     Breath  sounds: Normal breath sounds. No wheezing.  Abdominal:     General: Bowel sounds are normal. There is no distension.     Palpations: Abdomen is soft. There is no mass.     Tenderness: There is no abdominal tenderness. There is no guarding or rebound.  Musculoskeletal:        General: No tenderness. Normal range of motion.     Cervical back: Normal range of motion and neck supple.  Skin:    General: Skin is warm.     Comments: Bilateral foot plantar calluses noted.  Tender to palpation.  Neurological:     Mental Status: She is alert and oriented to person, place, and time.     Comments: Broad based gait.  Unsteady.  Psychiatric:        Mood and Affect: Affect normal.     LABORATORY DATA:  I have reviewed the data as listed    Component Value Date/Time   NA 138 06/06/2020 0847   NA 135 03/04/2017 0918   NA 135 01/08/2015 0850   K 3.8 06/06/2020 0847   K 3.7 01/08/2015 0850   CL 100 06/06/2020 0847   CL 101 01/08/2015 0850   CO2 27 06/06/2020 0847   CO2 26 01/08/2015 0850   GLUCOSE 86 06/06/2020 0847   GLUCOSE 161 (H) 01/08/2015 0850   BUN 18 06/06/2020 0847   BUN 8 03/04/2017 0918   BUN 17 01/08/2015 0850   CREATININE 0.69 06/06/2020 0847   CREATININE 0.82 01/22/2015 1559   CALCIUM 8.9 06/06/2020 0847   CALCIUM 9.3 01/08/2015 0850   PROT 7.1 06/06/2020 0847   PROT 6.4 03/04/2017 0918   PROT 6.8 01/22/2015 1559   ALBUMIN 3.7 06/06/2020 0847   ALBUMIN 2.5 (L) 03/04/2017 0918   ALBUMIN 3.9 01/22/2015 1559   AST 58 (  H) 06/06/2020 0847   AST 34 01/22/2015 1559   ALT 44 06/06/2020 0847   ALT 42 01/22/2015 1559   ALKPHOS 420 (H) 06/06/2020 0847   ALKPHOS 157 (H) 01/22/2015 1559   BILITOT 0.9 06/06/2020 0847   BILITOT 6.2 (H) 03/04/2017 0918   BILITOT 0.2 (L) 01/22/2015 1559   GFRNONAA >60 06/06/2020 0847   GFRNONAA >60 01/22/2015 1559   GFRAA >60 06/06/2020 0847   GFRAA >60 01/22/2015 1559    No results found for: SPEP, UPEP  Lab Results  Component Value Date    WBC 5.8 06/06/2020   NEUTROABS 4.5 06/06/2020   HGB 13.9 06/06/2020   HCT 40.7 06/06/2020   MCV 95.5 06/06/2020   PLT 162 06/06/2020      Chemistry      Component Value Date/Time   NA 138 06/06/2020 0847   NA 135 03/04/2017 0918   NA 135 01/08/2015 0850   K 3.8 06/06/2020 0847   K 3.7 01/08/2015 0850   CL 100 06/06/2020 0847   CL 101 01/08/2015 0850   CO2 27 06/06/2020 0847   CO2 26 01/08/2015 0850   BUN 18 06/06/2020 0847   BUN 8 03/04/2017 0918   BUN 17 01/08/2015 0850   CREATININE 0.69 06/06/2020 0847   CREATININE 0.82 01/22/2015 1559      Component Value Date/Time   CALCIUM 8.9 06/06/2020 0847   CALCIUM 9.3 01/08/2015 0850   ALKPHOS 420 (H) 06/06/2020 0847   ALKPHOS 157 (H) 01/22/2015 1559   AST 58 (H) 06/06/2020 0847   AST 34 01/22/2015 1559   ALT 44 06/06/2020 0847   ALT 42 01/22/2015 1559   BILITOT 0.9 06/06/2020 0847   BILITOT 6.2 (H) 03/04/2017 0918   BILITOT 0.2 (L) 01/22/2015 1559       RADIOGRAPHIC STUDIES: I have personally reviewed the radiological images as listed and agreed with the findings in the report. No results found.   ASSESSMENT & PLAN:  Carcinoma of upper-outer quadrant of left breast in female, estrogen receptor positive ([place]) # Recurrent metastatic breast cancer-lung/liver metastases ER PR positive HER-2 negative; on Gemcitabine; June 29th CT improvement of LUL ~2.9 cm; Liver lesions- smaller. STABLE.  # proceed with gemcitabine #12 every 2 weeks[sec to neutropenia/on pro].  Labs today reviewed;  acceptable for treatment today. Will order scans at next visit/or October.   # Gait instability- worse [Hx of LP disease/RT]-refer to PT.;  seizures; on keppra- STABLE.   # Plantar calluses s/p evaluation with podiatry- improved..   # cardiomyopathy/ Sinus tachycardia- on metoprolol-/Tamabcor; [march 2d-echo- 40-45%]; STABLE.   # Elevated LFTs/alkaline phosphatase 420-likely secondary underlying cirrhosis/ chemo/underlying malignancy-  STABLE; monitor for now.    # Peripheral neuropathy grade 1-2-  on Lyrica- STABLE.   # weight loss/ poor appetite; sec to malignancy/ chemo-improved-continue ensure.    # Right lower extremity DVT bilateral [November 28, 2019] CT scan abdomen pelvis negative for any DVT.  On eliquis; STABLE;   # DISPOSITION:  # Physical  Therapy for gait instability.  # gemcitabine/onprpo today;  # 2 weeks MD; labs- cbc/cmp;GEM- onpro; -Dr.B     Orders Placed This Encounter  Procedures  . CBC with Differential/Platelet    Standing Status:   Future    Standing Expiration Date:   06/06/2021  . Comprehensive metabolic panel    Standing Status:   Future    Standing Expiration Date:   06/06/2021   All questions were answered. The patient knows to call the clinic with  any problems, questions or concerns.     Cammie Sickle, MD 06/06/2020 9:34 AM

## 2020-06-06 NOTE — Assessment & Plan Note (Addendum)
#  Recurrent metastatic breast cancer-lung/liver metastases ER PR positive HER-2 negative; on Gemcitabine; June 29th CT improvement of LUL ~2.9 cm; Liver lesions- smaller. STABLE.  # proceed with gemcitabine #12 every 2 weeks[sec to neutropenia/on pro].  Labs today reviewed;  acceptable for treatment today. Will order scans at next visit/or October.   # Gait instability- worse [Hx of LP disease/RT]-refer to PT.;  seizures; on keppra- STABLE.   # Plantar calluses s/p evaluation with podiatry- improved..   # cardiomyopathy/ Sinus tachycardia- on metoprolol-/Tamabcor; [march 2d-echo- 40-45%]; STABLE.   # Elevated LFTs/alkaline phosphatase 420-likely secondary underlying cirrhosis/ chemo/underlying malignancy- STABLE; monitor for now.    # Peripheral neuropathy grade 1-2-  on Lyrica- STABLE.   # weight loss/ poor appetite; sec to malignancy/ chemo-improved-continue ensure.    # Right lower extremity DVT bilateral [November 28, 2019] CT scan abdomen pelvis negative for any DVT.  On eliquis; STABLE;   # DISPOSITION:  # Physical  Therapy for gait instability.  # gemcitabine/onprpo today;  # 2 weeks MD; labs- cbc/cmp;GEM- onpro; -Dr.B

## 2020-06-10 ENCOUNTER — Other Ambulatory Visit: Payer: Self-pay | Admitting: Internal Medicine

## 2020-06-20 ENCOUNTER — Other Ambulatory Visit: Payer: Self-pay

## 2020-06-20 ENCOUNTER — Encounter: Payer: Self-pay | Admitting: Internal Medicine

## 2020-06-20 ENCOUNTER — Inpatient Hospital Stay: Payer: Medicare HMO

## 2020-06-20 ENCOUNTER — Telehealth: Payer: Self-pay | Admitting: Nurse Practitioner

## 2020-06-20 ENCOUNTER — Inpatient Hospital Stay (HOSPITAL_BASED_OUTPATIENT_CLINIC_OR_DEPARTMENT_OTHER): Payer: Medicare HMO | Admitting: Internal Medicine

## 2020-06-20 VITALS — BP 111/75 | HR 96 | Temp 97.6°F | Resp 16 | Ht 66.0 in | Wt 136.9 lb

## 2020-06-20 DIAGNOSIS — Z5111 Encounter for antineoplastic chemotherapy: Secondary | ICD-10-CM | POA: Diagnosis not present

## 2020-06-20 DIAGNOSIS — C50412 Malignant neoplasm of upper-outer quadrant of left female breast: Secondary | ICD-10-CM

## 2020-06-20 DIAGNOSIS — Z5189 Encounter for other specified aftercare: Secondary | ICD-10-CM | POA: Diagnosis not present

## 2020-06-20 DIAGNOSIS — C787 Secondary malignant neoplasm of liver and intrahepatic bile duct: Secondary | ICD-10-CM | POA: Diagnosis not present

## 2020-06-20 DIAGNOSIS — Z170 Estrogen receptor positive status [ER+]: Secondary | ICD-10-CM

## 2020-06-20 DIAGNOSIS — R2681 Unsteadiness on feet: Secondary | ICD-10-CM

## 2020-06-20 DIAGNOSIS — D701 Agranulocytosis secondary to cancer chemotherapy: Secondary | ICD-10-CM | POA: Diagnosis not present

## 2020-06-20 DIAGNOSIS — C7931 Secondary malignant neoplasm of brain: Secondary | ICD-10-CM | POA: Diagnosis not present

## 2020-06-20 DIAGNOSIS — Z7189 Other specified counseling: Secondary | ICD-10-CM

## 2020-06-20 DIAGNOSIS — M8580 Other specified disorders of bone density and structure, unspecified site: Secondary | ICD-10-CM | POA: Diagnosis not present

## 2020-06-20 DIAGNOSIS — C7802 Secondary malignant neoplasm of left lung: Secondary | ICD-10-CM | POA: Diagnosis not present

## 2020-06-20 LAB — COMPREHENSIVE METABOLIC PANEL
ALT: 61 U/L — ABNORMAL HIGH (ref 0–44)
AST: 91 U/L — ABNORMAL HIGH (ref 15–41)
Albumin: 3.6 g/dL (ref 3.5–5.0)
Alkaline Phosphatase: 491 U/L — ABNORMAL HIGH (ref 38–126)
Anion gap: 9 (ref 5–15)
BUN: 22 mg/dL (ref 8–23)
CO2: 29 mmol/L (ref 22–32)
Calcium: 8.9 mg/dL (ref 8.9–10.3)
Chloride: 101 mmol/L (ref 98–111)
Creatinine, Ser: 0.68 mg/dL (ref 0.44–1.00)
GFR calc Af Amer: >60 mL/min (ref >60)
GFR calc non Af Amer: >60 mL/min (ref >60)
Glucose, Bld: 87 mg/dL (ref 70–99)
Potassium: 3.9 mmol/L (ref 3.5–5.1)
Sodium: 139 mmol/L (ref 135–145)
Total Bilirubin: 1 mg/dL (ref 0.3–1.2)
Total Protein: 7.1 g/dL (ref 6.5–8.1)

## 2020-06-20 LAB — CBC WITH DIFFERENTIAL/PLATELET
Abs Immature Granulocytes: 0.03 10*3/uL (ref 0.00–0.07)
Basophils Absolute: 0 10*3/uL (ref 0.0–0.1)
Basophils Relative: 1 %
Eosinophils Absolute: 0.1 10*3/uL (ref 0.0–0.5)
Eosinophils Relative: 1 %
HCT: 40.4 % (ref 36.0–46.0)
Hemoglobin: 14.3 g/dL (ref 12.0–15.0)
Immature Granulocytes: 1 %
Lymphocytes Relative: 13 %
Lymphs Abs: 0.8 10*3/uL (ref 0.7–4.0)
MCH: 33.3 pg (ref 26.0–34.0)
MCHC: 35.4 g/dL (ref 30.0–36.0)
MCV: 94 fL (ref 80.0–100.0)
Monocytes Absolute: 0.5 10*3/uL (ref 0.1–1.0)
Monocytes Relative: 8 %
Neutro Abs: 5 10*3/uL (ref 1.7–7.7)
Neutrophils Relative %: 76 %
Platelets: 143 10*3/uL — ABNORMAL LOW (ref 150–400)
RBC: 4.3 MIL/uL (ref 3.87–5.11)
RDW: 16.2 % — ABNORMAL HIGH (ref 11.5–15.5)
WBC: 6.5 10*3/uL (ref 4.0–10.5)
nRBC: 0 % (ref 0.0–0.2)

## 2020-06-20 MED ORDER — PEGFILGRASTIM 6 MG/0.6ML ~~LOC~~ PSKT
6.0000 mg | PREFILLED_SYRINGE | Freq: Once | SUBCUTANEOUS | Status: AC
  Administered 2020-06-20: 6 mg via SUBCUTANEOUS
  Filled 2020-06-20: qty 0.6

## 2020-06-20 MED ORDER — HEPARIN SOD (PORK) LOCK FLUSH 100 UNIT/ML IV SOLN
500.0000 [IU] | Freq: Once | INTRAVENOUS | Status: AC | PRN
  Administered 2020-06-20: 500 [IU]
  Filled 2020-06-20: qty 5

## 2020-06-20 MED ORDER — PROCHLORPERAZINE MALEATE 10 MG PO TABS
10.0000 mg | ORAL_TABLET | Freq: Once | ORAL | Status: AC
  Administered 2020-06-20: 10 mg via ORAL
  Filled 2020-06-20: qty 1

## 2020-06-20 MED ORDER — SODIUM CHLORIDE 0.9 % IV SOLN
Freq: Once | INTRAVENOUS | Status: AC
  Filled 2020-06-20: qty 250

## 2020-06-20 MED ORDER — SODIUM CHLORIDE 0.9 % IV SOLN
1800.0000 mg | Freq: Once | INTRAVENOUS | Status: AC
  Administered 2020-06-20: 1800 mg via INTRAVENOUS
  Filled 2020-06-20: qty 26.3

## 2020-06-20 NOTE — Progress Notes (Signed)
Pain in left hip when lifting leg. States it has been going on for 2 weeks.

## 2020-06-20 NOTE — Assessment & Plan Note (Addendum)
#  Recurrent metastatic breast cancer-lung/liver metastases ER PR positive HER-2 negative; on Gemcitabine; June 29th CT improvement of LUL ~2.9 cm; Liver lesions- smaller. STABLE.   # proceed with gemcitabine #12 every 2 weeks[sec to neutropenia/on pro].  Labs today reviewed;  acceptable for treatment today. Will order scans today.    # Gait instability- worse [Hx of LP disease/RT]-refer to PT.;  seizures; on keppra- STABLE.    # cardiomyopathy/ Sinus tachycardia- on metoprolol-/Tamabcor; [march 2d-echo- 40-45%]; STABLE  # Elevated LFTs/alkaline phosphatase 490-likely secondary underlying cirrhosis/ chemo/underlying malignancy- RISING; monitor for now; await CT scans as ordered.    # Peripheral neuropathy grade 1-2-  on Lyrica- STABLE  # weight loss/ poor appetite; sec to malignancy/ chemo- STABLE; continue ensure.    # Right lower extremity DVT bilateral [November 28, 2019] CT scan abdomen pelvis negative for any DVT.  On eliquis;  STABLE   # recommend booster COVID injection; declines Flu shot.   # DISPOSITION: please check on # Physical Therapy for gait instability.  # gemcitabine/onprpo today;  # 2 weeks MD; labs- cbc/cmp;GEM- onpro;CT C/A/P -Dr.B

## 2020-06-20 NOTE — Progress Notes (Signed)
[facility] OFFICE PROGRESS NOTE  Patient Care Team: Delsa Grana, PA-C as PCP - General (Family Medicine) Wellington Hampshire, MD as PCP - Cardiology (Cardiology) Bary Castilla, Forest Gleason, MD (General Surgery) Vladimir Crofts, MD as Consulting Physician (Neurology) Cammie Sickle, MD as Consulting Physician (Internal Medicine)  Cancer Staging No matching staging information was found for the patient.   Oncology History Overview Note  # LEFT BREAST IDC; STAGE II [cT2N1] ER >90%; PR- 50-90%; her 2 NEU- POS; s/p Neoadj chemo; AUG 2016- TCH+P s/p Lumpec & partial ALND- path CR; s/p RT [finished Nov 2016];  adj Herceptin; HELD for Jan 19th 2017 [in DEC 2016-EF dropped from 63 to 51%; FEB 27th EF-42.9%]; JAN 2016 START Arimidex; May 2017- EF- 60%; May 24th 2017-Re-start Herceptin q 3W; July 28th STOP herceptin [finished 12m/m2; sec to Low EF; however 2017 Oct EF= 67%; improved]  # DEC 4th 2017- Start Neratinib 4 pills; DEC 11th 3 pills; STOPPED.  # FEB 2018- RECURRENCE ER/PR positive; Her 2 NEGATIVE [liver Bx]  # MARCH 1st 2018- Tax-Cytoxan [poor tol]  # FEB 2018- Brain mets [SBRT; Dr.Crystal; finished Feb 28th 2018]  # March 2018- Eribulin- poor tolerance  # June 8th 2018- Started Faslodex + Abema [abema-multiple interruptions sec to neutropenia]  # MRI Brain- March 2019- Leptomeningeal mets [WBRT; No LP s/p WBRT- 01/09/2018]; STOP Abema [577mday-sec to severe cytopenia]+faslodex  # MAY 20tth 019- Start Xeloda; STOPPED in Nov 22nd 2019- sec to Progressive liver lesions on CT scan  # NOV 26th 2019- Faslodex + Piqaray 25058m; Jan 2020- Piqray 300 mg+ faslodex; FEB 17th CT scan- mixed response/ Overall progression; STOP Piqray + faslodex.   # FEB 25th 2020- ERIBULIN; Poor tolerance sec to severe neutropenia.  Stopped May 2020.  CT scan May 2020-new/progressive 2 cm liver lesion;   #July nd 2020-Ixempra x5 cycles-; NOV 2020-MRI liver- worsening liver lesions [rising  AST/ALT].   # NOV [id number] weekly [consent-]; December 31-2020-progression/rising LFTs.  Stop Taxol  #January 15th, 2021-Adriamycin weekly [C]; MARCH 2021- Partial response. STOP ADRIAMYCIN sec to EF drop 40-45%.   # April 2nd-2021-gemcitabine; 01/15/2020-GEM [800 mg/m2; #3 cycle 1000m7m] q 2 W; with onpro.   #May 06, 2019-MRI brain 5 new lesions [3 mm to 10 mm]-asymptomatic; [previous whole brain radiation]- OCT 2020- Re-Irradiation.   # PN- G-2; may 2017- Cymbalta 60mg32mARCH 2018-  acute vascular insuff of BIL LE [? Taxotere vasoconstriction on asprin]; # hemorragic shock LGIB [s/p colo; Dr.Wohl]  #  SVT- on flecainide.   # April 2016- Liver Bx- NEG;   # Drop in EF from Herceptin [recovered OCt 27th- EF 65%]; March 2020 ejection fraction 40 to 45%.  Stop Adriamycin.  # BMD- jan 2017- osteopenia; #Gait instability/visual disturbance-[Dr.Shah]- S/p EEG [5/13]- negative for seizures.  # 2020- SEP-s/p Pallaitive care evaluation  -----------------------------------------------------------     MOLECULAR TESTING- [facility]- Sep 4th 2018- PDL-1 0%/NEG for BRCA; MSI-Stable; Positive for PI3K; ccnd-1; FGF** -----------------------------------------------------------  Dx: Metastatic Breast cancer [ER/PR-Pos; her -2 NEG] Stage IV; goals: palliative Current treatment-gemcitabine chemotherapy [C]   Carcinoma of upper-outer quadrant of left breast in female, estrogen receptor positive (HCC) [place]25/2020 - 01/17/2019 Chemotherapy   The patient had eriBULin mesylate (HALAVEN) 2.1 mg in sodium chloride 0.9 % 100 mL chemo infusion, 1.2 mg/m2 = 2.1 mg (100 % of original dose 1.2 mg/m2), Intravenous,  Once, 3 of 4 cycles Dose modification: 1.2 mg/m2 (original dose 1.2 mg/m2, Cycle 1, Reason: Provider Judgment), 0.8 mg/m2 (original  dose 1.2 mg/m2, Cycle 3, Reason: Provider Judgment) Administration: 2.1 mg (11/22/2018), 2.1 mg (11/29/2018), 2.1 mg (12/20/2018), 1.4 mg (01/17/2019)  for  chemotherapy treatment.    03/30/2019 - 07/12/2019 Chemotherapy   The patient had pegfilgrastim (NEULASTA ONPRO KIT) injection 6 mg, 6 mg, Subcutaneous, Once, 5 of 8 cycles Administration: 6 mg (03/30/2019), 6 mg (04/20/2019), 6 mg (05/11/2019), 6 mg (06/22/2019), 6 mg (06/01/2019) ixabepilone (IXEMPRA) 60 mg in lactated ringers 250 mL chemo infusion, 33.5 mg/m2 = 57 mg (100 % of original dose 32 mg/m2), Intravenous,  Once, 5 of 8 cycles Dose modification: 32 mg/m2 (original dose 32 mg/m2, Cycle 1, Reason: Provider Judgment) Administration: 72 mg (04/20/2019), 72 mg (05/11/2019), 72 mg (06/22/2019), 72 mg (06/01/2019)  for chemotherapy treatment.    08/18/2019 - 09/28/2019 Chemotherapy   The patient had PACLitaxel (TAXOL) 150 mg in sodium chloride 0.9 % 250 mL chemo infusion (</= $RemoveBefor'80mg'yfSxJyhCnsSk$ /m2), 80 mg/m2 = 150 mg, Intravenous,  Once, 1 of 4 cycles Administration: 150 mg (08/18/2019), 150 mg (09/08/2019), 150 mg (09/15/2019)  for chemotherapy treatment.    10/13/2019 - 11/30/2019 Chemotherapy   The patient had DOXOrubicin (ADRIAMYCIN) chemo injection 38 mg, 20 mg/m2 = 38 mg, Intravenous,  Once, 6 of 9 cycles Administration: 38 mg (10/13/2019), 38 mg (10/20/2019), 38 mg (10/27/2019), 38 mg (11/17/2019), 38 mg (11/24/2019), 38 mg (11/10/2019) palonosetron (ALOXI) injection 0.25 mg, 0.25 mg, Intravenous,  Once, 6 of 9 cycles Administration: 0.25 mg (10/13/2019), 0.25 mg (10/20/2019), 0.25 mg (10/27/2019), 0.25 mg (11/17/2019), 0.25 mg (11/24/2019), 0.25 mg (11/10/2019)  for chemotherapy treatment.    12/29/2019 -  Chemotherapy   The patient had pegfilgrastim (NEULASTA ONPRO KIT) injection 6 mg, 6 mg, Subcutaneous, Once, 12 of 18 cycles Administration: 6 mg (01/15/2020), 6 mg (01/29/2020), 6 mg (02/12/2020), 6 mg (02/28/2020), 6 mg (03/14/2020), 6 mg (03/28/2020), 6 mg (04/11/2020), 6 mg (04/25/2020), 6 mg (05/09/2020), 6 mg (05/23/2020), 6 mg (06/06/2020), 6 mg (06/20/2020) gemcitabine (GEMZAR) 1,444 mg in sodium chloride 0.9 % 250 mL chemo  infusion, 800 mg/m2 = 1,444 mg, Intravenous,  Once, 13 of 19 cycles Dose modification: 1,000 mg/m2 (original dose 800 mg/m2, Cycle 3, Reason: Provider Judgment) Administration: 1,444 mg (12/29/2019), 1,444 mg (01/15/2020), 1,800 mg (01/29/2020), 1,800 mg (02/12/2020), 1,800 mg (02/28/2020), 1,800 mg (03/14/2020), 1,800 mg (03/28/2020), 1,800 mg (04/11/2020), 1,800 mg (04/25/2020), 1,800 mg (05/09/2020), 1,800 mg (05/23/2020), 1,800 mg (06/06/2020), 1,800 mg (06/20/2020)  for chemotherapy treatment.      INTERVAL HISTORY:  Sarah Horne 64 y.o.  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  Patient complains of left-sided hip pain the last 2 weeks.  However the pain improves with movement.  Denies any weakness.  No falls.  However patient does complain of gait instability.  Interested in physical therapy.  Appetite is fair.  No further weight loss.     Review of Systems  Constitutional: Positive for malaise/fatigue. Negative for chills, diaphoresis and fever.  HENT: Negative for nosebleeds and sore throat.   Eyes: Negative for double vision.  Respiratory: Negative for cough, hemoptysis, sputum production, shortness of breath and wheezing.   Cardiovascular: Negative for chest pain, palpitations, orthopnea and leg swelling.  Gastrointestinal: Positive for constipation. Negative for abdominal pain, blood in stool, heartburn, melena, nausea and vomiting.  Genitourinary: Negative for dysuria, frequency and urgency.  Musculoskeletal: Positive for joint pain. Negative for back pain.  Skin: Negative for itching.  Neurological:  Positive for dizziness and tingling. Negative for focal weakness, weakness and headaches.  Endo/Heme/Allergies: Does not bruise/bleed easily.  Psychiatric/Behavioral: Negative for depression. The patient is not nervous/anxious and does not have insomnia.      PAST MEDICAL HISTORY :  Past Medical History:  Diagnosis Date  . Chemotherapy-induced peripheral neuropathy (HCC)   . Epistaxis    a. 11/2016 in setting of asa/plavix-->silver nitrate cauterization.  . GI bleed    a. 11/2016 Admission w/ GIB and hypovolemic shock req 3u PRBC's;  b. 11/2016 ECG: gastritis & nonbleeding peptic ulcer; c. 11/2016 Conlonoscopy: rectal and sigmoid colonic ulcers.  . Heart attack (HCC)    a. 1998 Cath @ [facility]: reportedly no intervention required.  Marland Kitchen Herceptin-induced cardiomyopathy (HCC)    a. In the setting of Herceptin Rx for breast cancer (initiated 12/2014); b. 03/2015 MUGA EF 64%; b. 08/2015 MUGA: EF 51%; c. 10/2015 MUGA: EF 44%; d. 11/2015 Echo: EF 45-50%; e. 01/2016 MUGA: EF 60%; f. 06/2016 MUGA EF 65%; g. 10/2016 MUGA: EF 61%;  h. 12/2016 Echo: EF 55-60%, gr1 DD.  Marland Kitchen Hyperlipidemia   . Hypertension   . Neuropathy   . Possible PAD (peripheral artery disease) (HCC)    a. 11/2016 LE cyanosis and weak pulses-->CTA w/o significant Ao-BiFem dzs. ? distal dzs-->ASA/Plavix initiated by vascular surgery.  Marland Kitchen PSVT (paroxysmal supraventricular tachycardia) (HCC)    a. Dx 11/2016.  . Pulmonary embolism (HCC)    a. 12/2016 CTA Chest: small nonocclusive PE in inferior segment of the Left lingula, somewhat eccentric filling defect suggesting chronic rather than acute embolic event; b. 12/2016 LE U/S:  No DVT; c. 12/2016 Echo: Nl RV fxn, nl PASP.  Marland Kitchen Recurrent Metastatic breast cancer (HCC)    a. Dx 2016: Stage II, ER positive, PR positive, HER-2/neu overexpressing of the left breast-->chemo/radiation; b. 10/2016 CT Abd/pelvis: diffuse liver mets, ill defined sclerotic bone lesions-T12;  c. 10/2016 MRI brain: metastatic lesion along L temporal lobe (19x65mm) w/ extensive surrounding edema & 87mm midline shift to right.  . Sepsis (HCC)   . Sinus tachycardia     PAST SURGICAL HISTORY :   Past Surgical History:  Procedure Laterality Date  . BREAST BIOPSY Left 2016   Positive  . BREAST  LUMPECTOMY WITH SENTINEL LYMPH NODE BIOPSY Left 05/23/2015   Procedure: LEFT BREAST WIDE EXCISION WITH AXILLARY DISSECTION, MASTOPLASTY ;  Surgeon: Earline Mayotte, MD;  Location: [facility] ORS;  Service: General;  Laterality: Left;  . BREAST SURGERY Left 12/18/14   breast biopsy/INVASIVE DUCTAL CARCINOMA OF BREAST, NOTTINGHAM GRADE 2.  Marland Kitchen BREAST SURGERY  05/23/2015.   Wide excision/mastoplasty, axillary dissection. No residual invasive cancer, positive for residual DCIS. 0/2 nodes identified on axillary dissection. (no SLN by technetium or methylene blue)  . CARDIAC CATHETERIZATION    . COLONOSCOPY WITH PROPOFOL N/A 12/10/2016   Procedure: COLONOSCOPY WITH PROPOFOL;  Surgeon: Midge Minium, MD;  Location: [facility] ENDOSCOPY;  Service: Endoscopy;  Laterality: N/A;  . COLONOSCOPY WITH PROPOFOL N/A 02/13/2017   Procedure: COLONOSCOPY WITH PROPOFOL;  Surgeon: Midge Minium, MD;  Location: [facility] ENDOSCOPY;  Service: Endoscopy;  Laterality: N/A;  . ESOPHAGOGASTRODUODENOSCOPY N/A 11/03/2019   Procedure: ESOPHAGOGASTRODUODENOSCOPY (EGD);  Surgeon: Toney Reil, MD;  Location: [facility] ENDOSCOPY;  Service: Gastroenterology;  Laterality: N/A;  . ESOPHAGOGASTRODUODENOSCOPY (EGD) WITH PROPOFOL N/A 12/08/2016   Procedure: ESOPHAGOGASTRODUODENOSCOPY (EGD) WITH PROPOFOL;  Surgeon: Midge Minium, MD;  Location: [facility] ENDOSCOPY;  Service: Endoscopy;  Laterality: N/A;  . IVC FILTER INSERTION N/A 02/15/2017   Procedure: IVC  Filter Insertion;  Surgeon: Algernon Huxley, MD;  Location: [place] CV LAB;  Service: Cardiovascular;  Laterality: N/A;  . PORTACATH PLACEMENT Right 12-31-14   Dr Bary Castilla    FAMILY HISTORY :   Family History  Problem Relation Age of Onset  . Breast cancer Maternal Aunt   . Breast cancer Cousin   . Brain cancer Maternal Uncle   . Diabetes Mother   . Hypertension Mother   . Stroke Mother     SOCIAL HISTORY:   Social History   Tobacco Use  . Smoking status: Light Tobacco Smoker    Packs/day: 0.25     Years: 18.00    Pack years: 4.50    Types: Cigarettes  . Smokeless tobacco: Never Used  Vaping Use  . Vaping Use: Never used  Substance Use Topics  . Alcohol use: No    Alcohol/week: 0.0 standard drinks  . Drug use: No    ALLERGIES:  is allergic to no known allergies.  MEDICATIONS:  Current Outpatient Medications  Medication Sig Dispense Refill  . acetaminophen (TYLENOL) 650 MG CR tablet Take 650 mg by mouth every 8 (eight) hours as needed for pain.    Marland Kitchen ELIQUIS 5 MG TABS tablet TAKE 1 TABLET BY MOUTH TWICE A DAY 60 tablet 4  . flecainide (TAMBOCOR) 50 MG tablet Take 1 tablet (50 mg total) by mouth 2 (two) times daily. 60 tablet 4  . levETIRAcetam (KEPPRA) 250 MG tablet Take 1 tablet by mouth 2 (two) times daily.     . magnesium oxide (MAG-OX) 400 MG tablet TAKE 1 TABLET BY MOUTH TWICE A DAY 60 tablet 4  . meclizine (ANTIVERT) 25 MG tablet Take 1 tablet (25 mg total) by mouth 3 (three) times daily as needed for dizziness. 30 tablet 0  . metoprolol tartrate (LOPRESSOR) 25 MG tablet TAKE 1 TABLET BY MOUTH TWICE A DAY 60 tablet 1  . montelukast (SINGULAIR) 10 MG tablet TAKE 1 TABLET BY MOUTH EVERYDAY AT BEDTIME 90 tablet 2  . ondansetron (ZOFRAN) 8 MG tablet One pill every 8 hours as needed for nausea/vomitting. 40 tablet 1  . pregabalin (LYRICA) 100 MG capsule TAKE 1 CAPSULE BY MOUTH TWICE A DAY 60 capsule 2  . prochlorperazine (COMPAZINE) 10 MG tablet Take 1 tablet (10 mg total) by mouth every 6 (six) hours as needed for nausea or vomiting. 40 tablet 1  . Vitamin D, Ergocalciferol, (DRISDOL) 1.25 MG (50000 UNIT) CAPS capsule TAKE 1 CAPSULE (50,000 UNITS TOTAL) ONCE A WEEK BY MOUTH. 12 capsule 1   No current facility-administered medications for this visit.   Facility-Administered Medications Ordered in Other Visits  Medication Dose Route Frequency Provider Last Rate Last Admin  . 0.9 %  sodium chloride infusion   Intravenous Continuous [name], MD   New Bag at 12/10/16 1140   . 0.9 %  sodium chloride infusion   Intravenous Continuous Lloyd Huger, MD 999 mL/hr at 04/24/15 1520 New Bag at 05/23/15 0951  . 0.9 %  sodium chloride infusion   Intravenous Continuous Verlon Au, NP 999 mL/hr at 10/31/19 1522 New Bag at 10/31/19 1522  . heparin lock flush 100 unit/mL  500 Units Intravenous Once Berenzon, Dmitriy, MD      . sodium chloride 0.9 % injection 10 mL  10 mL Intravenous PRN [name], MD   10 mL at 04/04/15 1440  . sodium chloride flush (NS) 0.9 % injection 10 mL  10 mL Intravenous PRN Roxana Hires, MD  PHYSICAL EXAMINATION: ECOG PERFORMANCE STATUS: 1 - Symptomatic but completely ambulatory  BP 111/75 (BP Location: Right Arm, Patient Position: Sitting, Cuff Size: Normal)   Pulse 96   Temp 97.6 F (36.4 C) (Tympanic)   Resp 16   Ht $R'5\' 6"'Pt$  (1.676 m)   Wt 136 lb 14.4 oz (62.1 kg)   SpO2 98%   BMI 22.10 kg/m   Filed Weights   06/20/20 0901  Weight: 136 lb 14.4 oz (62.1 kg)    Physical Exam Constitutional:      Comments: Patient is alone.  In wheel chair.   HENT:     Head: Normocephalic and atraumatic.     Mouth/Throat:     Pharynx: No oropharyngeal exudate.  Eyes:     Pupils: Pupils are equal, round, and reactive to light.  Cardiovascular:     Rate and Rhythm: Normal rate and regular rhythm.  Pulmonary:     Effort: Pulmonary effort is normal. No respiratory distress.     Breath sounds: Normal breath sounds. No wheezing.  Abdominal:     General: Bowel sounds are normal. There is no distension.     Palpations: Abdomen is soft. There is no mass.     Tenderness: There is no abdominal tenderness. There is no guarding or rebound.  Musculoskeletal:        General: No tenderness. Normal range of motion.     Cervical back: Normal range of motion and neck supple.  Skin:    General: Skin is warm.     Comments: Bilateral foot plantar calluses noted.  Tender to palpation.  Neurological:     Mental Status: She is alert and  oriented to person, place, and time.     Comments: Broad based gait.  Unsteady.  Psychiatric:        Mood and Affect: Affect normal.     LABORATORY DATA:  I have reviewed the data as listed    Component Value Date/Time   NA 139 06/20/2020 0840   NA 135 03/04/2017 0918   NA 135 01/08/2015 0850   K 3.9 06/20/2020 0840   K 3.7 01/08/2015 0850   CL 101 06/20/2020 0840   CL 101 01/08/2015 0850   CO2 29 06/20/2020 0840   CO2 26 01/08/2015 0850   GLUCOSE 87 06/20/2020 0840   GLUCOSE 161 (H) 01/08/2015 0850   BUN 22 06/20/2020 0840   BUN 8 03/04/2017 0918   BUN 17 01/08/2015 0850   CREATININE 0.68 06/20/2020 0840   CREATININE 0.82 01/22/2015 1559   CALCIUM 8.9 06/20/2020 0840   CALCIUM 9.3 01/08/2015 0850   PROT 7.1 06/20/2020 0840   PROT 6.4 03/04/2017 0918   PROT 6.8 01/22/2015 1559   ALBUMIN 3.6 06/20/2020 0840   ALBUMIN 2.5 (L) 03/04/2017 0918   ALBUMIN 3.9 01/22/2015 1559   AST 91 (H) 06/20/2020 0840   AST 34 01/22/2015 1559   ALT 61 (H) 06/20/2020 0840   ALT 42 01/22/2015 1559   ALKPHOS 491 (H) 06/20/2020 0840   ALKPHOS 157 (H) 01/22/2015 1559   BILITOT 1.0 06/20/2020 0840   BILITOT 6.2 (H) 03/04/2017 0918   BILITOT 0.2 (L) 01/22/2015 1559   GFRNONAA >60 06/20/2020 0840   GFRNONAA >60 01/22/2015 1559   GFRAA >60 06/20/2020 0840   GFRAA >60 01/22/2015 1559    No results found for: SPEP, UPEP  Lab Results  Component Value Date   WBC 6.5 06/20/2020   NEUTROABS 5.0 06/20/2020   HGB 14.3 06/20/2020   HCT  40.4 06/20/2020   MCV 94.0 06/20/2020   PLT 143 (L) 06/20/2020      Chemistry      Component Value Date/Time   NA 139 06/20/2020 0840   NA 135 03/04/2017 0918   NA 135 01/08/2015 0850   K 3.9 06/20/2020 0840   K 3.7 01/08/2015 0850   CL 101 06/20/2020 0840   CL 101 01/08/2015 0850   CO2 29 06/20/2020 0840   CO2 26 01/08/2015 0850   BUN 22 06/20/2020 0840   BUN 8 03/04/2017 0918   BUN 17 01/08/2015 0850   CREATININE 0.68 06/20/2020 0840    CREATININE 0.82 01/22/2015 1559      Component Value Date/Time   CALCIUM 8.9 06/20/2020 0840   CALCIUM 9.3 01/08/2015 0850   ALKPHOS 491 (H) 06/20/2020 0840   ALKPHOS 157 (H) 01/22/2015 1559   AST 91 (H) 06/20/2020 0840   AST 34 01/22/2015 1559   ALT 61 (H) 06/20/2020 0840   ALT 42 01/22/2015 1559   BILITOT 1.0 06/20/2020 0840   BILITOT 6.2 (H) 03/04/2017 0918   BILITOT 0.2 (L) 01/22/2015 1559       RADIOGRAPHIC STUDIES: I have personally reviewed the radiological images as listed and agreed with the findings in the report. No results found.   ASSESSMENT & PLAN:  Carcinoma of upper-outer quadrant of left breast in female, estrogen receptor positive ([place]) # Recurrent metastatic breast cancer-lung/liver metastases ER PR positive HER-2 negative; on Gemcitabine; June 29th CT improvement of LUL ~2.9 cm; Liver lesions- smaller. STABLE.   # proceed with gemcitabine #12 every 2 weeks[sec to neutropenia/on pro].  Labs today reviewed;  acceptable for treatment today. Will order scans today.    # Gait instability- worse [Hx of LP disease/RT]-refer to PT.;  seizures; on keppra- STABLE.    # cardiomyopathy/ Sinus tachycardia- on metoprolol-/Tamabcor; [march 2d-echo- 40-45%]; STABLE  # Elevated LFTs/alkaline phosphatase 490-likely secondary underlying cirrhosis/ chemo/underlying malignancy- RISING; monitor for now; await CT scans as ordered.    # Peripheral neuropathy grade 1-2-  on Lyrica- STABLE  # weight loss/ poor appetite; sec to malignancy/ chemo- STABLE; continue ensure.    # Right lower extremity DVT bilateral [November 28, 2019] CT scan abdomen pelvis negative for any DVT.  On eliquis;  STABLE   # recommend booster COVID injection; declines Flu shot.   # DISPOSITION: please check on # Physical Therapy for gait instability.  # gemcitabine/onprpo today;  # 2 weeks MD; labs- cbc/cmp;GEM- onpro;CT C/A/P -Dr.B     Orders Placed This Encounter  Procedures  . CT CHEST ABDOMEN  PELVIS W CONTRAST    Standing Status:   Future    Standing Expiration Date:   06/20/2021    Order Specific Question:   Preferred imaging location?    Answer:   [facility]    Order Specific Question:   Radiology Contrast Protocol - do NOT remove file path    Answer:   [email or web address]  . Ambulatory referral to Physical Therapy    Referral Priority:   Urgent    Referral Type:   Physical Medicine    Referral Reason:   Specialty Services Required    Requested Specialty:   Physical Therapy    Number of Visits Requested:   1   All questions were answered. The patient knows to call the clinic with any problems, questions or concerns.     Cammie Sickle, MD 06/21/2020 8:39 AM

## 2020-06-20 NOTE — Telephone Encounter (Signed)
I attempted to call Sarah Horne to reschedule f/u pc visit, message left with contact information

## 2020-06-24 ENCOUNTER — Other Ambulatory Visit: Payer: Self-pay

## 2020-06-24 ENCOUNTER — Encounter: Payer: Self-pay | Admitting: Emergency Medicine

## 2020-06-24 ENCOUNTER — Telehealth: Payer: Self-pay | Admitting: *Deleted

## 2020-06-24 ENCOUNTER — Emergency Department: Payer: Medicare HMO

## 2020-06-24 ENCOUNTER — Observation Stay
Admission: EM | Admit: 2020-06-24 | Discharge: 2020-06-27 | Disposition: A | Discharge status: Home / Self Care | Payer: Medicare HMO | Attending: Internal Medicine | Admitting: Internal Medicine

## 2020-06-24 DIAGNOSIS — R2681 Unsteadiness on feet: Secondary | ICD-10-CM | POA: Insufficient documentation

## 2020-06-24 DIAGNOSIS — R531 Weakness: Secondary | ICD-10-CM | POA: Diagnosis not present

## 2020-06-24 DIAGNOSIS — Z20822 Contact with and (suspected) exposure to covid-19: Secondary | ICD-10-CM | POA: Diagnosis not present

## 2020-06-24 DIAGNOSIS — Z7722 Contact with and (suspected) exposure to environmental tobacco smoke (acute) (chronic): Secondary | ICD-10-CM | POA: Insufficient documentation

## 2020-06-24 DIAGNOSIS — C50412 Malignant neoplasm of upper-outer quadrant of left female breast: Principal | ICD-10-CM | POA: Insufficient documentation

## 2020-06-24 DIAGNOSIS — R52 Pain, unspecified: Secondary | ICD-10-CM | POA: Diagnosis not present

## 2020-06-24 DIAGNOSIS — I700 Atherosclerosis of aorta: Secondary | ICD-10-CM | POA: Diagnosis not present

## 2020-06-24 DIAGNOSIS — Z87898 Personal history of other specified conditions: Secondary | ICD-10-CM

## 2020-06-24 DIAGNOSIS — C7931 Secondary malignant neoplasm of brain: Secondary | ICD-10-CM | POA: Diagnosis present

## 2020-06-24 DIAGNOSIS — Z7901 Long term (current) use of anticoagulants: Secondary | ICD-10-CM | POA: Diagnosis not present

## 2020-06-24 DIAGNOSIS — I10 Essential (primary) hypertension: Secondary | ICD-10-CM | POA: Insufficient documentation

## 2020-06-24 DIAGNOSIS — D259 Leiomyoma of uterus, unspecified: Secondary | ICD-10-CM | POA: Diagnosis not present

## 2020-06-24 DIAGNOSIS — M549 Dorsalgia, unspecified: Secondary | ICD-10-CM | POA: Diagnosis not present

## 2020-06-24 DIAGNOSIS — Z86711 Personal history of pulmonary embolism: Secondary | ICD-10-CM

## 2020-06-24 DIAGNOSIS — Z79899 Other long term (current) drug therapy: Secondary | ICD-10-CM | POA: Diagnosis not present

## 2020-06-24 DIAGNOSIS — D72829 Elevated white blood cell count, unspecified: Secondary | ICD-10-CM

## 2020-06-24 DIAGNOSIS — Z8679 Personal history of other diseases of the circulatory system: Secondary | ICD-10-CM

## 2020-06-24 DIAGNOSIS — Z170 Estrogen receptor positive status [ER+]: Secondary | ICD-10-CM

## 2020-06-24 DIAGNOSIS — R0902 Hypoxemia: Secondary | ICD-10-CM | POA: Diagnosis not present

## 2020-06-24 DIAGNOSIS — K7460 Unspecified cirrhosis of liver: Secondary | ICD-10-CM | POA: Diagnosis not present

## 2020-06-24 DIAGNOSIS — M5489 Other dorsalgia: Secondary | ICD-10-CM | POA: Diagnosis not present

## 2020-06-24 DIAGNOSIS — J439 Emphysema, unspecified: Secondary | ICD-10-CM | POA: Diagnosis not present

## 2020-06-24 LAB — CBC WITH DIFFERENTIAL/PLATELET
Abs Immature Granulocytes: 2.9 10*3/uL — ABNORMAL HIGH (ref 0.00–0.07)
Basophils Absolute: 0 10*3/uL (ref 0.0–0.1)
Basophils Relative: 0 %
Eosinophils Absolute: 0.1 10*3/uL (ref 0.0–0.5)
Eosinophils Relative: 0 %
HCT: 40.5 % (ref 36.0–46.0)
Hemoglobin: 14 g/dL (ref 12.0–15.0)
Immature Granulocytes: 4 %
Lymphocytes Relative: 2 %
Lymphs Abs: 1.2 10*3/uL (ref 0.7–4.0)
MCH: 33.7 pg (ref 26.0–34.0)
MCHC: 34.6 g/dL (ref 30.0–36.0)
MCV: 97.6 fL (ref 80.0–100.0)
Monocytes Absolute: 0.4 10*3/uL (ref 0.1–1.0)
Monocytes Relative: 1 %
Neutro Abs: 65.8 10*3/uL — ABNORMAL HIGH (ref 1.7–7.7)
Neutrophils Relative %: 93 %
Platelets: 197 10*3/uL (ref 150–400)
RBC: 4.15 MIL/uL (ref 3.87–5.11)
RDW: 17.1 % — ABNORMAL HIGH (ref 11.5–15.5)
WBC: 70.4 10*3/uL (ref 4.0–10.5)
nRBC: 0 % (ref 0.0–0.2)

## 2020-06-24 LAB — CBC
HCT: 39.7 % (ref 36.0–46.0)
HCT: 41.1 % (ref 36.0–46.0)
Hemoglobin: 14 g/dL (ref 12.0–15.0)
Hemoglobin: 13.8 g/dL (ref 12.0–15.0)
MCH: 33.9 pg (ref 26.0–34.0)
MCH: 33.6 pg (ref 26.0–34.0)
MCHC: 35.3 g/dL (ref 30.0–36.0)
MCHC: 33.6 g/dL (ref 30.0–36.0)
MCV: 96.1 fL (ref 80.0–100.0)
MCV: 100 fL (ref 80.0–100.0)
Platelets: 197 10*3/uL (ref 150–400)
Platelets: 199 10*3/uL (ref 150–400)
RBC: 4.13 MIL/uL (ref 3.87–5.11)
RBC: 4.11 MIL/uL (ref 3.87–5.11)
RDW: 16.9 % — ABNORMAL HIGH (ref 11.5–15.5)
RDW: 16.9 % — ABNORMAL HIGH (ref 11.5–15.5)
WBC: 70.1 10*3/uL (ref 4.0–10.5)
nRBC: 0 % (ref 0.0–0.2)

## 2020-06-24 LAB — BASIC METABOLIC PANEL
Anion gap: 11 (ref 5–15)
BUN: 15 mg/dL (ref 8–23)
CO2: 28 mmol/L (ref 22–32)
Calcium: 9.3 mg/dL (ref 8.9–10.3)
Chloride: 100 mmol/L (ref 98–111)
Creatinine, Ser: 0.7 mg/dL (ref 0.44–1.00)
GFR calc Af Amer: >60 mL/min (ref >60)
GFR calc non Af Amer: >60 mL/min (ref >60)
Glucose, Bld: 79 mg/dL (ref 70–99)
Potassium: 4.2 mmol/L (ref 3.5–5.1)
Sodium: 139 mmol/L (ref 135–145)

## 2020-06-24 LAB — URINALYSIS, COMPLETE (UACMP) WITH MICROSCOPIC
Bilirubin Urine: NEGATIVE
Glucose, UA: NEGATIVE mg/dL
Hgb urine dipstick: NEGATIVE
Ketones, ur: NEGATIVE mg/dL
Leukocytes,Ua: NEGATIVE
Nitrite: NEGATIVE
Protein, ur: NEGATIVE mg/dL
Specific Gravity, Urine: 1.024 (ref 1.005–1.030)
pH: 6 (ref 5.0–8.0)

## 2020-06-24 LAB — HEPATIC FUNCTION PANEL
ALT: 80 U/L — ABNORMAL HIGH (ref 0–44)
AST: 98 U/L — ABNORMAL HIGH (ref 15–41)
Albumin: 3.8 g/dL (ref 3.5–5.0)
Alkaline Phosphatase: 521 U/L — ABNORMAL HIGH (ref 38–126)
Bilirubin, Direct: 0.5 mg/dL — ABNORMAL HIGH (ref 0.0–0.2)
Indirect Bilirubin: 1.4 mg/dL — ABNORMAL HIGH (ref 0.3–0.9)
Total Bilirubin: 1.9 mg/dL — ABNORMAL HIGH (ref 0.3–1.2)
Total Protein: 7.4 g/dL (ref 6.5–8.1)

## 2020-06-24 MED ORDER — IOHEXOL 300 MG/ML  SOLN
100.0000 mL | Freq: Once | INTRAMUSCULAR | Status: AC | PRN
  Administered 2020-06-24: 100 mL via INTRAVENOUS

## 2020-06-24 MED ORDER — ENOXAPARIN SODIUM 40 MG/0.4ML ~~LOC~~ SOLN: 40.0000 mg | SUBCUTANEOUS | Status: DC

## 2020-06-24 NOTE — Telephone Encounter (Signed)
TY heather.  Faythe Casa, NP 06/24/2020 11:17 AM

## 2020-06-24 NOTE — ED Triage Notes (Signed)
Pt in via EMS from home. Pt with cancer with mets and is having back pain for the last cupule of months. Pt had difficulty getting out of her recliner. Pt reports pain is worse with moving. 100% RA, 111/75, 80 HR

## 2020-06-24 NOTE — ED Triage Notes (Signed)
Says low back pain for 2 weeks.  Says when she gets up it is hard to get her legs working.  Says yesterday she couldn't get out of the recliner.

## 2020-06-24 NOTE — ED Notes (Signed)
Drew blood from right [facility] area--red, green , purple and blue top sent.  She does have a port, but do not want to access it in triage.

## 2020-06-24 NOTE — Telephone Encounter (Signed)
Can you have her come in a 1pm? Labs at 1230 or so ?  Faythe Casa, NP 06/24/2020 10:58 AM

## 2020-06-24 NOTE — Telephone Encounter (Addendum)
Contacted patient. Explained that we are willing to see her in the clinic at 1230 today. She is presently not able to get out of the chair. She is dizzy and is not able to get back up. She is sitting in her recliner in her night gown. She stated that her brother in law attempted to come over earlier this morning and was able to help her get out of the chair. He is not there. Patient lives by herself. No one is home with her. Discussed transportation concerns. For our [facility] to transport her, she would have to be outside ready for pick up and able to safely get out the door. Patient unable to do that and move out of the chair. I advised the patient to call 911 for ER transport if she is indeed that weak. Pt states that she doesn't want to call 911 as she is trying to avoid a $300 EMS fee. She states that she will try and call her brother in law back over to the house to see if he can get her out of the chair. She will call our office back if the outcome. I offered to call 911, but she refused.

## 2020-06-24 NOTE — Telephone Encounter (Addendum)
Patient called asking to come in for evaluation. She reports that she thinks she may be dehydrated even though she states she is eating and drinking ok. She reports that she is weak and gets dizzy when she stands. She also reports that when she gets up that her legs start to shake to the point that she fears trying to walk. Report that she has back pain also. She denies fever cough or congestion. Please advise

## 2020-06-24 NOTE — ED Provider Notes (Signed)
[facility] Emergency Department Provider Note   ____________________________________________   I have reviewed the triage vital signs and the nursing notes.   HISTORY  Chief Complaint Weakness  History limited by: Not Limited   HPI Sarah Horne is a 64 y.o. female who presents to the emergency department today because of concern for weakness. The patient states that she has been feeling increasingly weak. This has been getting worse over the past three months. It has been associated with shaking of her legs. Since yesterday however she has been unable to get up off of her recliner. She also complains of left hip pain that has been present for 2 weeks. The patient denies any fevers. Denies any change in urination or defecation.    Records reviewed. Per medical record review patient has a history of metastatic breat cancer.   Past Medical History:  Diagnosis Date  . Chemotherapy-induced peripheral neuropathy ([place])   . Epistaxis    a. 11/2016 in setting of asa/plavix-->silver nitrate cauterization.  . GI bleed    a. 11/2016 Admission w/ GIB and hypovolemic shock req 3u PRBC's;  b. 11/2016 ECG: gastritis & nonbleeding peptic ulcer; c. 11/2016 Conlonoscopy: rectal and sigmoid colonic ulcers.  . Heart attack ([place])    a. 1998 Cath @ [facility]: reportedly no intervention required.  Marland Kitchen Herceptin-induced cardiomyopathy ([place])    a. In the setting of Herceptin Rx for breast cancer (initiated 12/2014); b. 03/2015 MUGA EF 64%; b. 08/2015 MUGA: EF 51%; c. 10/2015 MUGA: EF 44%; d. 11/2015 Echo: EF 45-50%; e. 01/2016 MUGA: EF 60%; f. 06/2016 MUGA EF 65%; g. 10/2016 MUGA: EF 61%;  h. 12/2016 Echo: EF 55-60%, gr1 DD.  Marland Kitchen Hyperlipidemia   . Hypertension   . Neuropathy   . Possible PAD (peripheral artery disease) ([place])    a. 11/2016 LE cyanosis and weak pulses-->CTA w/o significant Ao-BiFem dzs. ? distal dzs-->ASA/Plavix initiated by vascular surgery.  Marland Kitchen PSVT (paroxysmal supraventricular  tachycardia) ([place])    a. Dx 11/2016.  . Pulmonary embolism ([place])    a. 12/2016 CTA Chest: small nonocclusive PE in inferior segment of the Left lingula, somewhat eccentric filling defect suggesting chronic rather than acute embolic event; b. 10/9796 LE U/S:  No DVT; c. 12/2016 Echo: Nl RV fxn, nl PASP.  Marland Kitchen Recurrent Metastatic breast cancer ([place])    a. Dx 2016: Stage II, ER positive, PR positive, HER-2/neu overexpressing of the left breast-->chemo/radiation; b. 10/2016 CT Abd/pelvis: diffuse liver mets, ill defined sclerotic bone lesions-T12;  c. 10/2016 MRI brain: metastatic lesion along L temporal lobe (19x61m) w/ extensive surrounding edema & 550mmidline shift to right.  . Sepsis ([place]  . Sinus tachycardia     Patient Active Problem List   Diagnosis Date Noted  . Chemotherapy-induced fatigue 11/02/2019  . Convalescence following chemotherapy 11/02/2019  . Leukopenia due to antineoplastic chemotherapy ([place]02/12/2019  . Elevated serum creatinine 11/02/2019  . Acute deep vein thrombosis (DVT) of femoral vein of right lower extremity (HCM[place]02/11/2019  . Accidental fall from chair, initial encounter 10/05/2019  . Metastatic breast cancer (HC[place]01/03/2020  . Sepsis (HCS[place]01/03/2020  . Tachyarrhythmia 10/05/2019  . Combined forms of age-related cataract of both eyes 12/06/2018  . Nuclear sclerotic cataract of left eye 12/06/2018  . Brain metastasis (HCMyrtle [place]08/22/2018  . Blood in stool   . Polyp of sigmoid colon   . Benign neoplasm of transverse colon   . Benign neoplasm of ascending colon   . Lower GI  bleed   . Weakness of right lower extremity 02/11/2017  . Orthostatic lightheadedness 01/14/2017  . Atrial tachycardia ([place]) 01/14/2017  . Hypokalemia 01/14/2017  . Pulmonary emboli ([place]) 01/12/2017  . Dehydration 12/25/2016  . Blood loss anemia 12/15/2016  . Ulceration of colon   . Heartburn   . Duodenitis   . Gastritis without bleeding   . GI bleed   . Acute esophagitis   . Acute  esophagogastric ulcer   . Rectal bleed   . Hypovolemic shock ([place])   . Ischemic leg 12/04/2016  . Chemotherapy induced neutropenia ([place]) 11/20/2016  . Palliative care by specialist   . Goals of care, counseling/discussion   . DNR (do not resuscitate) discussion   . Cerebral edema ([place]) 11/10/2016  . Encounter for monitoring cardiotoxic drug therapy 11/09/2016  . Elevated LFTs 10/20/2016  . Peripheral neuropathy due to chemotherapy ([place]) 03/13/2016  . Encounter for antineoplastic chemotherapy 03/13/2016  . HLD (hyperlipidemia) 01/29/2016  . Hyperglycemia, unspecified 01/29/2016  . Cardiomyopathy ([place]) 01/13/2016  . Hypertension 07/18/2015  . Carcinoma of upper-outer quadrant of left breast in female, estrogen receptor positive ([place]) 12/27/2014    Past Surgical History:  Procedure Laterality Date  . BREAST BIOPSY Left 2016   Positive  . BREAST LUMPECTOMY WITH SENTINEL LYMPH NODE BIOPSY Left 05/23/2015   Procedure: LEFT BREAST WIDE EXCISION WITH AXILLARY DISSECTION, MASTOPLASTY ;  Surgeon: [name], MD;  Location: [facility] ORS;  Service: General;  Laterality: Left;  . BREAST SURGERY Left 12/18/14   breast biopsy/INVASIVE DUCTAL CARCINOMA OF BREAST, NOTTINGHAM GRADE 2.  Marland Kitchen BREAST SURGERY  05/23/2015.   Wide excision/mastoplasty, axillary dissection. No residual invasive cancer, positive for residual DCIS. 0/2 nodes identified on axillary dissection. (no SLN by technetium or methylene blue)  . CARDIAC CATHETERIZATION    . COLONOSCOPY WITH PROPOFOL N/A 12/10/2016   Procedure: COLONOSCOPY WITH PROPOFOL;  Surgeon: Lucilla Lame, MD;  Location: [facility] ENDOSCOPY;  Service: Endoscopy;  Laterality: N/A;  . COLONOSCOPY WITH PROPOFOL N/A 02/13/2017   Procedure: COLONOSCOPY WITH PROPOFOL;  Surgeon: Lucilla Lame, MD;  Location: [facility] ENDOSCOPY;  Service: Endoscopy;  Laterality: N/A;  . ESOPHAGOGASTRODUODENOSCOPY N/A 11/03/2019   Procedure: ESOPHAGOGASTRODUODENOSCOPY (EGD);  Surgeon: Lin Landsman,  MD;  Location[facility]COPY;  Service: Gastroenterology;  Laterality: N/A;  . ESOPHAGOGASTRODUODENOSCOPY (EGD) WITH PROPOFOL N/A 12/08/2016   Procedure: ESOPHAGOGASTRODUODENOSCOPY (EGD) WITH PROPOFOL;  Surgeon: Lucilla Lame, MD;  Location: [facility] ENDOSCOPY;  Service: Endoscopy;  Laterality: N/A;  . IVC FILTER INSERTION N/A 02/15/2017   Procedure: IVC Filter Insertion;  Surgeon: Algernon Huxley, MD;  Location: [place] CV LAB;  Service: Cardiovascular;  Laterality: N/A;  . PORTACATH PLACEMENT Right 12-31-14   Dr Bary Castilla    Prior to Admission medications   Medication Sig Start Date End Date Taking? Authorizing Provider  acetaminophen (TYLENOL) 650 MG CR tablet Take 650 mg by mouth every 8 (eight) hours as needed for pain.    [provider]  ELIQUIS 5 MG TABS tablet TAKE 1 TABLET BY MOUTH TWICE A DAY 06/11/20   Cammie Sickle, MD  flecainide (TAMBOCOR) 50 MG tablet Take 1 tablet (50 mg total) by mouth 2 (two) times daily. 03/13/20   Theora Gianotti, NP  levETIRAcetam (KEPPRA) 250 MG tablet Take 1 tablet by mouth 2 (two) times daily.  04/04/20 04/04/21  [provider]  magnesium oxide (MAG-OX) 400 MG tablet TAKE 1 TABLET BY MOUTH TWICE A DAY 03/28/20   Wellington Hampshire, MD  meclizine (ANTIVERT) 25  MG tablet Take 1 tablet (25 mg total) by mouth 3 (three) times daily as needed for dizziness. 08/30/19   Jacquelin Hawking, NP  metoprolol tartrate (LOPRESSOR) 25 MG tablet TAKE 1 TABLET BY MOUTH TWICE A DAY 05/13/20   Cammie Sickle, MD  montelukast (SINGULAIR) 10 MG tablet TAKE 1 TABLET BY MOUTH EVERYDAY AT BEDTIME 06/11/20   Cammie Sickle, MD  ondansetron (ZOFRAN) 8 MG tablet One pill every 8 hours as needed for nausea/vomitting. 12/29/19   Cammie Sickle, MD  pregabalin (LYRICA) 100 MG capsule TAKE 1 CAPSULE BY MOUTH TWICE A DAY 03/19/20   Delsa Grana, PA-C  prochlorperazine (COMPAZINE) 10 MG tablet Take 1 tablet (10 mg total) by mouth every 6 (six) hours as  needed for nausea or vomiting. 12/29/19   Cammie Sickle, MD  Vitamin D, Ergocalciferol, (DRISDOL) 1.25 MG (50000 UNIT) CAPS capsule TAKE 1 CAPSULE (50,000 UNITS TOTAL) ONCE A WEEK BY MOUTH. 05/21/20   Cammie Sickle, MD    Allergies No known allergies  Family History  Problem Relation Age of Onset  . Breast cancer Maternal Aunt   . Breast cancer Cousin   . Brain cancer Maternal Uncle   . Diabetes Mother   . Hypertension Mother   . Stroke Mother     Social History Social History   Tobacco Use  . Smoking status: Light Tobacco Smoker    Packs/day: 0.25    Years: 18.00    Pack years: 4.50    Types: Cigarettes  . Smokeless tobacco: Never Used  Vaping Use  . Vaping Use: Never used  Substance Use Topics  . Alcohol use: No    Alcohol/week: 0.0 standard drinks  . Drug use: No    Review of Systems Constitutional: No fever/chills Eyes: No visual changes. ENT: No sore throat. Cardiovascular: Denies chest pain. Respiratory: Denies shortness of breath. Gastrointestinal: No abdominal pain.  No nausea, no vomiting.  No diarrhea.   Genitourinary: Negative for dysuria. Musculoskeletal: Positive for left hip pain. Skin: Negative for rash. Neurological: Positive for weakness and shaking in legs.  ____________________________________________   PHYSICAL EXAM:  VITAL SIGNS: ED Triage Vitals  Enc Vitals Group     BP 06/24/20 1346 105/73     Pulse Rate 06/24/20 1346 93     Resp 06/24/20 1346 16     Temp 06/24/20 1346 98.5 F (36.9 C)     Temp Source 06/24/20 1346 Oral     SpO2 06/24/20 1346 96 %     Weight 06/24/20 1346 140 lb (63.5 kg)     Height 06/24/20 1346 5' 6"  (1.676 m)     Head Circumference --      Peak Flow --      Pain Score 06/24/20 1358 5   Constitutional: Alert and oriented.  Eyes: Conjunctivae are normal.  ENT      Head: Normocephalic and atraumatic.      Nose: No congestion/rhinnorhea.      Mouth/Throat: Mucous membranes are moist.       Neck: No stridor. Hematological/Lymphatic/Immunilogical: No cervical lymphadenopathy. Cardiovascular: Normal rate, regular rhythm.  No murmurs, rubs, or gallops.  Respiratory: Normal respiratory effort without tachypnea nor retractions. Breath sounds are clear and equal bilaterally. No wheezes/rales/rhonchi. Gastrointestinal: Soft and non tender. No rebound. No guarding.  Genitourinary: Deferred Musculoskeletal: Normal range of motion in all extremities. No lower extremity edema. No spinal tenderness.  Neurologic:  Normal speech and language. No gross focal neurologic deficits are appreciated.  Skin:  Skin is warm, dry and intact. No rash noted. Psychiatric: Mood and affect are normal. Speech and behavior are normal. Patient exhibits appropriate insight and judgment.  ____________________________________________    LABS (pertinent positives/negatives)  CBC wbc 70.1, hgb 14.0, plt 197 BMP wnl UA hazy, 0-5 rbc and wbc, rare bacteria ____________________________________________   EKG  None  ____________________________________________    RADIOLOGY  CT chest/abd/pel Progressive metastatic disease. No acute infection  ____________________________________________   PROCEDURES  Procedures  ____________________________________________   INITIAL IMPRESSION / ASSESSMENT AND PLAN / ED COURSE  Pertinent labs & imaging results that were available during my care of the patient were reviewed by me and considered in my medical decision making (see chart for details).   Patient presented to the emergency department today because of concerns for worsening weakness.  Patient has a history of known metastatic breast cancer.  Work-up is notable for significant leukocytosis.  I talked with Dr. Rogue Bussing with oncology.  At this point he thinks could be related to chemotherapy.  Did recommend pan scan with the CT to evaluate for potential infection.  This also would allow [place] to evaluate  cancer burden.  CT scan was performed without obvious infection source.  At this time lower suspicion for infectious etiology of the leukocytosis.  Will plan on admission.  ____________________________________________   FINAL CLINICAL IMPRESSION(S) / ED DIAGNOSES  Final diagnoses:  Weakness  Leukocytosis, unspecified type     Note: This dictation was prepared with Dragon dictation. Any transcriptional errors that result from this process are unintentional     Nance Pear, MD 06/24/20 2320

## 2020-06-25 ENCOUNTER — Observation Stay (HOSPITAL_BASED_OUTPATIENT_CLINIC_OR_DEPARTMENT_OTHER)
Admit: 2020-06-25 | Discharge: 2020-06-25 | Disposition: A | Discharge status: Home / Self Care | Payer: Medicare HMO | Attending: Internal Medicine | Admitting: Internal Medicine

## 2020-06-25 ENCOUNTER — Telehealth: Payer: Self-pay | Admitting: Internal Medicine

## 2020-06-25 DIAGNOSIS — I428 Other cardiomyopathies: Secondary | ICD-10-CM | POA: Diagnosis not present

## 2020-06-25 DIAGNOSIS — I361 Nonrheumatic tricuspid (valve) insufficiency: Secondary | ICD-10-CM

## 2020-06-25 DIAGNOSIS — C7800 Secondary malignant neoplasm of unspecified lung: Secondary | ICD-10-CM | POA: Diagnosis not present

## 2020-06-25 DIAGNOSIS — D701 Agranulocytosis secondary to cancer chemotherapy: Secondary | ICD-10-CM

## 2020-06-25 DIAGNOSIS — C787 Secondary malignant neoplasm of liver and intrahepatic bile duct: Secondary | ICD-10-CM | POA: Diagnosis not present

## 2020-06-25 DIAGNOSIS — C7931 Secondary malignant neoplasm of brain: Secondary | ICD-10-CM

## 2020-06-25 DIAGNOSIS — D72829 Elevated white blood cell count, unspecified: Secondary | ICD-10-CM

## 2020-06-25 DIAGNOSIS — I429 Cardiomyopathy, unspecified: Secondary | ICD-10-CM | POA: Diagnosis not present

## 2020-06-25 DIAGNOSIS — Z8679 Personal history of other diseases of the circulatory system: Secondary | ICD-10-CM | POA: Diagnosis not present

## 2020-06-25 DIAGNOSIS — R531 Weakness: Secondary | ICD-10-CM

## 2020-06-25 DIAGNOSIS — Z86711 Personal history of pulmonary embolism: Secondary | ICD-10-CM

## 2020-06-25 DIAGNOSIS — C50412 Malignant neoplasm of upper-outer quadrant of left female breast: Secondary | ICD-10-CM | POA: Diagnosis not present

## 2020-06-25 DIAGNOSIS — C7951 Secondary malignant neoplasm of bone: Secondary | ICD-10-CM

## 2020-06-25 DIAGNOSIS — R2689 Other abnormalities of gait and mobility: Secondary | ICD-10-CM | POA: Diagnosis not present

## 2020-06-25 DIAGNOSIS — Z87898 Personal history of other specified conditions: Secondary | ICD-10-CM

## 2020-06-25 LAB — CBC
HCT: 36.6 % (ref 36.0–46.0)
Hemoglobin: 13.1 g/dL (ref 12.0–15.0)
MCH: 33.7 pg (ref 26.0–34.0)
MCHC: 35.8 g/dL (ref 30.0–36.0)
MCV: 94.1 fL (ref 80.0–100.0)
Platelets: 173 10*3/uL (ref 150–400)
RBC: 3.89 MIL/uL (ref 3.87–5.11)
RDW: 16.6 % — ABNORMAL HIGH (ref 11.5–15.5)
WBC: 45.3 10*3/uL — ABNORMAL HIGH (ref 4.0–10.5)
nRBC: 0 % (ref 0.0–0.2)

## 2020-06-25 LAB — BASIC METABOLIC PANEL
Anion gap: 15 (ref 5–15)
BUN: 13 mg/dL (ref 8–23)
CO2: 23 mmol/L (ref 22–32)
Calcium: 9 mg/dL (ref 8.9–10.3)
Chloride: 100 mmol/L (ref 98–111)
Creatinine, Ser: 0.6 mg/dL (ref 0.44–1.00)
GFR calc Af Amer: >60 mL/min (ref >60)
GFR calc non Af Amer: >60 mL/min (ref >60)
Glucose, Bld: 64 mg/dL — ABNORMAL LOW (ref 70–99)
Potassium: 4.1 mmol/L (ref 3.5–5.1)
Sodium: 138 mmol/L (ref 135–145)

## 2020-06-25 LAB — RESPIRATORY PANEL BY RT PCR (FLU A&B, COVID)
Influenza A by PCR: NEGATIVE
Influenza B by PCR: NEGATIVE
SARS Coronavirus 2 by RT PCR: NEGATIVE

## 2020-06-25 MED ORDER — PREGABALIN 50 MG PO CAPS
100.0000 mg | ORAL_CAPSULE | Freq: Two times a day (BID) | ORAL | Status: DC
  Administered 2020-06-25 – 2020-06-27 (×6): 100 mg via ORAL
  Filled 2020-06-25 (×6): qty 2

## 2020-06-25 MED ORDER — VITAMIN D (ERGOCALCIFEROL) 1.25 MG (50000 UNIT) PO CAPS
50000.0000 [IU] | ORAL_CAPSULE | ORAL | Status: DC
  Administered 2020-06-25: 50000 [IU] via ORAL
  Filled 2020-06-25: qty 1

## 2020-06-25 MED ORDER — METOPROLOL TARTRATE 25 MG PO TABS
25.0000 mg | ORAL_TABLET | Freq: Two times a day (BID) | ORAL | Status: DC
  Administered 2020-06-25 – 2020-06-26 (×4): 25 mg via ORAL
  Filled 2020-06-25 (×6): qty 1

## 2020-06-25 MED ORDER — FLECAINIDE ACETATE 50 MG PO TABS
50.0000 mg | ORAL_TABLET | Freq: Two times a day (BID) | ORAL | Status: DC
  Administered 2020-06-25 – 2020-06-27 (×5): 50 mg via ORAL
  Filled 2020-06-25 (×7): qty 1

## 2020-06-25 MED ORDER — LEVETIRACETAM 250 MG PO TABS
250.0000 mg | ORAL_TABLET | Freq: Two times a day (BID) | ORAL | Status: DC
  Administered 2020-06-25 – 2020-06-27 (×5): 250 mg via ORAL
  Filled 2020-06-25 (×7): qty 1

## 2020-06-25 MED ORDER — CHLORHEXIDINE GLUCONATE CLOTH 2 % EX PADS
6.0000 | MEDICATED_PAD | Freq: Every day | CUTANEOUS | Status: DC
  Administered 2020-06-25 – 2020-06-27 (×2): 6 via TOPICAL

## 2020-06-25 MED ORDER — MAGNESIUM OXIDE 400 (241.3 MG) MG PO TABS
400.0000 mg | ORAL_TABLET | Freq: Two times a day (BID) | ORAL | Status: DC
  Administered 2020-06-25 – 2020-06-27 (×6): 400 mg via ORAL
  Filled 2020-06-25 (×6): qty 1

## 2020-06-25 MED ORDER — MONTELUKAST SODIUM 10 MG PO TABS
10.0000 mg | ORAL_TABLET | Freq: Every day | ORAL | Status: DC
  Administered 2020-06-25 – 2020-06-26 (×2): 10 mg via ORAL
  Filled 2020-06-25 (×3): qty 1

## 2020-06-25 MED ORDER — LORATADINE 10 MG PO TABS
10.0000 mg | ORAL_TABLET | Freq: Every day | ORAL | Status: DC
  Administered 2020-06-25 – 2020-06-27 (×3): 10 mg via ORAL
  Filled 2020-06-25 (×3): qty 1

## 2020-06-25 MED ORDER — APIXABAN 5 MG PO TABS
5.0000 mg | ORAL_TABLET | Freq: Two times a day (BID) | ORAL | Status: DC
  Administered 2020-06-25 – 2020-06-27 (×5): 5 mg via ORAL
  Filled 2020-06-25 (×5): qty 1

## 2020-06-25 NOTE — ED Notes (Signed)
PT/OT in for eval

## 2020-06-25 NOTE — ED Notes (Signed)
Pt cleaned up and linens changed. Pt placed in clean gown and given warm blankets. New external cath placed. Pt given breakfast tray and set up at this time

## 2020-06-25 NOTE — H&P (Signed)
History and Physical    Sarah Horne EXB:[id number] DOB: 06-30-1956 DOA: 06/24/2020  PCP: Delsa Grana, PA-C  Patient coming from: Home  I have personally briefly reviewed patient's old medical records in [place]  Chief Complaint: weakness  HPI: Sarah Horne is a 64 y.o. female with medical history significant for metastatic breast cancer/leptomeningeal disease metastatic to brain on gemcitabine, hx of seizure, SVT, cardiomyopathy, PE/DVT on Eliquis, history of GI bleed and hypertension who presents with concerns of weakness.  Patient reports that about 3 months ago she had a fall where she fell on the lawn and laid there for several hours and since then her right knee has not been the same.  She presents to the ED today because for the last 2 days she has not been able to get out of her recliner due to bilateral lower extremity weakness.  She lives alone and had to call her brother to help her.  She has chronic back pain but denies anything acute.  Denies any numbness or tingling.  States she was otherwise in her normal state of health prior to this weakness episode.  Denies any fevers.  No chest pain or shortness of breath.  No nausea, vomiting or diarrhea.  States her last chemo session was Thursday of last week.  ED Course:  She was afebrile, intermittently tachycardic normotensive on room air. Her CBC was notable for significant leukocytosis of 70.4, normal hemoglobin and normal platelets with unremarkable smear review.  CMP unremarkable. UA was negative.  CT chest and CT abdomen shows similar cavitary lesion in the left apex seen on previous study in June.  There is multiple peripherally enhancing lesions throughout the liver likely metastatic disease that has progressed since the last study.  There is also multiple poorly defined lucent lesions in the pelvis and T12 vertebral body likely metastatic.  ER physician Dr. Archie Balboa spoke with Dr.Brahmanday who is her primary  oncologist who thinks that it could be related to chemo.  Review of Systems: Constitutional: No Weight Change, No Fever ENT/Mouth: No sore throat, No Rhinorrhea Eyes: No Eye Pain, No Vision Changes Cardiovascular: No Chest Pain, no SOB Respiratory: No Cough, No Sputum, No Wheezing, no Dyspnea  Gastrointestinal: No Nausea, No Vomiting, No Diarrhea, No Constipation, No Pain Genitourinary: no Urinary Incontinence Musculoskeletal: No Arthralgias, No Myalgias Skin: No Skin Lesions, No Pruritus, Neuro: no Weakness, No Numbness Psych: No Anxiety/Panic, No Depression, no decrease appetite Heme/Lymph: No Bruising, No Bleeding  Past Medical History:  Diagnosis Date  . Chemotherapy-induced peripheral neuropathy ([place])   . Epistaxis    a. 11/2016 in setting of asa/plavix-->silver nitrate cauterization.  . GI bleed    a. 11/2016 Admission w/ GIB and hypovolemic shock req 3u PRBC's;  b. 11/2016 ECG: gastritis & nonbleeding peptic ulcer; c. 11/2016 Conlonoscopy: rectal and sigmoid colonic ulcers.  . Heart attack ([place])    a. 1998 Cath @ [facility]: reportedly no intervention required.  Marland Kitchen Herceptin-induced cardiomyopathy ([place])    a. In the setting of Herceptin Rx for breast cancer (initiated 12/2014); b. 03/2015 MUGA EF 64%; b. 08/2015 MUGA: EF 51%; c. 10/2015 MUGA: EF 44%; d. 11/2015 Echo: EF 45-50%; e. 01/2016 MUGA: EF 60%; f. 06/2016 MUGA EF 65%; g. 10/2016 MUGA: EF 61%;  h. 12/2016 Echo: EF 55-60%, gr1 DD.  Marland Kitchen Hyperlipidemia   . Hypertension   . Neuropathy   . Possible PAD (peripheral artery disease) ([place])    a. 11/2016 LE cyanosis and weak pulses-->CTA w/o  significant Ao-BiFem dzs. ? distal dzs-->ASA/Plavix initiated by vascular surgery.  Marland Kitchen PSVT (paroxysmal supraventricular tachycardia) ([place])    a. Dx 11/2016.  . Pulmonary embolism ([place])    a. 12/2016 CTA Chest: small nonocclusive PE in inferior segment of the Left lingula, somewhat eccentric filling defect suggesting chronic rather than acute embolic event; b.  0/9604 LE U/S:  No DVT; c. 12/2016 Echo: Nl RV fxn, nl PASP.  Marland Kitchen Recurrent Metastatic breast cancer ([place])    a. Dx 2016: Stage II, ER positive, PR positive, HER-2/neu overexpressing of the left breast-->chemo/radiation; b. 10/2016 CT Abd/pelvis: diffuse liver mets, ill defined sclerotic bone lesions-T12;  c. 10/2016 MRI brain: metastatic lesion along L temporal lobe (19x49m) w/ extensive surrounding edema & 587mmidline shift to right.  . [place]epsis [place]  . Sinus tachycardia     Past Surgical History:  Procedure Laterality Date  . BREAST BIOPSY Left 2016   Positive  . BREAST LUMPECTOMY WITH SENTINEL LYMPH NODE BIOPSY Left 05/23/2015   Procedure: LEFT BREAST WIDE EXCISION WITH AXILLARY DISSECTION, MASTOPLASTY ;  Surgeon: JeRobert BellowMD;  Location: [facility] ORS;  Service: General;  Laterality: Left;  . BREAST SURGERY Left 12/18/14   breast biopsy/INVASIVE DUCTAL CARCINOMA OF BREAST, NOTTINGHAM GRADE 2.  . Marland KitchenREAST SURGERY  05/23/2015.   Wide excision/mastoplasty, axillary dissection. No residual invasive cancer, positive for residual DCIS. 0/2 nodes identified on axillary dissection. (no SLN by technetium or methylene blue)  . CARDIAC CATHETERIZATION    . COLONOSCOPY WITH PROPOFOL N/A 12/10/2016   Procedure: COLONOSCOPY WITH PROPOFOL;  Surgeon: DaLucilla LameMD;  Location: [facility] ENDOSCOPY;  Service: Endoscopy;  Laterality: N/A;  . COLONOSCOPY WITH PROPOFOL N/A 02/13/2017   Procedure: COLONOSCOPY WITH PROPOFOL;  Surgeon: WoLucilla LameMD;  [facility];  Service: Endoscopy;  Laterality: N/A;  . ESOPHAGOGASTRODUODENOSCOPY N/A 11/03/2019   Procedure: ESOPHAGOGASTRODUODENOSCOPY (EGD);  Surgeon: VaLin LandsmanMD;  [facility];  Service: Gastroenterology;  Laterality: N/A;  . ESOPHAGOGASTRODUODENOSCOPY (EGD) WITH PROPOFOL N/A 12/08/2016   Procedure: ESOPHAGOGASTRODUODENOSCOPY (EGD) WITH PROPOFOL;  Surgeon: DaLucilla LameMD;  Location: [facility];  Service: Endoscopy;   Laterality: N/A;  . IVC FILTER INSERTION N/A 02/15/2017   Procedure: IVC Filter Insertion;  Surgeon: DeAlgernon HuxleyMD;  [facility];  Service: Cardiovascular;  Laterality: N/A;  . PORTACATH PLACEMENT Right 12-31-14   Dr ByBary Castilla   reports that she has been smoking cigarettes. She has a 4.50 pack-year smoking history. She has never used smokeless tobacco. She reports that she does not drink alcohol and does not use drugs. Social History  Allergies  Allergen Reactions  . No Known Allergies     Family History  Problem Relation Age of Onset  . Breast cancer Maternal Aunt   . Breast cancer Cousin   . Brain cancer Maternal Uncle   . Diabetes Mother   . Hypertension Mother   . Stroke Mother      Prior to Admission medications   Medication Sig Start Date End Date Taking? Authorizing Provider  ELIQUIS 5 MG TABS tablet TAKE 1 TABLET BY MOUTH TWICE A DAY 06/11/20  Yes BrCammie SickleMD  flecainide (TAMBOCOR) 50 MG tablet Take 1 tablet (50 mg total) by mouth 2 (two) times daily. 03/13/20  Yes BeTheora GianottiNP  levETIRAcetam (KEPPRA) 250 MG tablet Take 1 tablet by mouth 2 (two) times daily.  04/04/20 04/04/21 Yes [provider]  levocetirizine (XYZAL) 5 MG tablet Take 1  tablet by mouth daily. 04/25/20  Yes [provider]  magnesium oxide (MAG-OX) 400 MG tablet TAKE 1 TABLET BY MOUTH TWICE A DAY 03/28/20  Yes Wellington Hampshire, MD  metoprolol tartrate (LOPRESSOR) 25 MG tablet TAKE 1 TABLET BY MOUTH TWICE A DAY 05/13/20  Yes Cammie Sickle, MD  montelukast (SINGULAIR) 10 MG tablet TAKE 1 TABLET BY MOUTH EVERYDAY AT BEDTIME 06/11/20  Yes Cammie Sickle, MD  pregabalin (LYRICA) 100 MG capsule TAKE 1 CAPSULE BY MOUTH TWICE A DAY 03/19/20  Yes Delsa Grana, PA-C  Vitamin D, Ergocalciferol, (DRISDOL) 1.25 MG (50000 UNIT) CAPS capsule TAKE 1 CAPSULE (50,000 UNITS TOTAL) ONCE A WEEK BY MOUTH. 05/21/20  Yes Cammie Sickle, MD  meclizine  (ANTIVERT) 25 MG tablet Take 1 tablet (25 mg total) by mouth 3 (three) times daily as needed for dizziness. Patient not taking: Reported on 06/24/2020 08/30/19   Jacquelin Hawking, NP  ondansetron (ZOFRAN) 8 MG tablet One pill every 8 hours as needed for nausea/vomitting. Patient not taking: Reported on 06/24/2020 12/29/19   Cammie Sickle, MD  prochlorperazine (COMPAZINE) 10 MG tablet Take 1 tablet (10 mg total) by mouth every 6 (six) hours as needed for nausea or vomiting. Patient not taking: Reported on 06/24/2020 12/29/19   Cammie Sickle, MD    Physical Exam: Vitals:   06/24/20 2042 06/24/20 2100 06/24/20 2130 06/24/20 2300  BP: (!) 111/59 108/78  114/66  Pulse: (!) 109 (!) 106 99 91  Resp: 18 19  17   Temp:      TempSrc:      SpO2: 100% 99% 100% 100%  Weight:      Height:        Constitutional: NAD, calm, comfortable, well-appearing thin female laying at 40 degree incline asleep in bed Vitals:   06/24/20 2042 06/24/20 2100 06/24/20 2130 06/24/20 2300  BP: (!) 111/59 108/78  114/66  Pulse: (!) 109 (!) 106 99 91  Resp: 18 19  17   Temp:      TempSrc:      SpO2: 100% 99% 100% 100%  Weight:      Height:       Eyes: PERRL, lids and conjunctivae normal ENMT: Mucous membranes are moist.  Neck: normal, supple Respiratory: clear to auscultation bilaterally, no wheezing, no crackles. Normal respiratory effort. No accessory muscle use.  Cardiovascular: Regular rate and rhythm, no murmurs / rubs / gallops. No extremity edema. 2+ pedal pulses.  Abdomen: no tenderness, no masses palpated.Bowel sounds positive.  Musculoskeletal: no clubbing / cyanosis. No joint deformity upper and lower extremities. Good ROM, no contractures. Normal muscle tone.  Skin: no rashes, lesions, ulcers. No induration Neurologic: CN 2-12 grossly intact. Sensation intact, DTR normal. Strength 5/5 in all 4.  Negative straight leg raise.  No pain with palpation of the spine and back. Psychiatric: Normal  judgment and insight. Alert and oriented x 3. Normal mood.     Labs on Admission: I have personally reviewed following labs and imaging studies  CBC: Recent Labs  Lab 06/20/20 0840 06/24/20 1403  WBC 6.5 70.4*  70.1*  NEUTROABS 5.0 65.8*  HGB 14.3 13.8  14.0  14.0  HCT 40.4 41.1  40.5  39.7  MCV 94.0 100.0  97.6  96.1  PLT 143* 199  197  025   Basic Metabolic Panel: Recent Labs  Lab 06/20/20 0840 06/24/20 1403  NA 139 139  K 3.9 4.2  CL 101 100  CO2 29 28  GLUCOSE 87 79  BUN 22 15  CREATININE 0.68 0.70  CALCIUM 8.9 9.3   GFR: Estimated Creatinine Clearance: 64.8 mL/min (by C-G formula based on SCr of 0.7 mg/dL). Liver Function Tests: Recent Labs  Lab 06/20/20 0840 06/24/20 1403  AST 91* 98*  ALT 61* 80*  ALKPHOS 491* 521*  BILITOT 1.0 1.9*  PROT 7.1 7.4  ALBUMIN 3.6 3.8   No results for input(s): LIPASE, AMYLASE in the last 168 hours. No results for input(s): AMMONIA in the last 168 hours. Coagulation Profile: No results for input(s): INR, PROTIME in the last 168 hours. Cardiac Enzymes: No results for input(s): CKTOTAL, CKMB, CKMBINDEX, TROPONINI in the last 168 hours. BNP (last 3 results) No results for input(s): PROBNP in the last 8760 hours. HbA1C: No results for input(s): HGBA1C in the last 72 hours. CBG: No results for input(s): GLUCAP in the last 168 hours. Lipid Profile: No results for input(s): CHOL, HDL, LDLCALC, TRIG, CHOLHDL, LDLDIRECT in the last 72 hours. Thyroid Function Tests: No results for input(s): TSH, T4TOTAL, FREET4, T3FREE, THYROIDAB in the last 72 hours. Anemia Panel: No results for input(s): VITAMINB12, FOLATE, FERRITIN, TIBC, IRON, RETICCTPCT in the last 72 hours. Urine analysis:    Component Value Date/Time   COLORURINE AMBER (A) 06/24/2020 2028   APPEARANCEUR HAZY (A) 06/24/2020 2028   LABSPEC 1.024 06/24/2020 2028   PHURINE 6.0 06/24/2020 2028   GLUCOSEU NEGATIVE 06/24/2020 2028   HGBUR NEGATIVE 06/24/2020  2028   BILIRUBINUR NEGATIVE 06/24/2020 2028   KETONESUR NEGATIVE 06/24/2020 2028   PROTEINUR NEGATIVE 06/24/2020 2028   NITRITE NEGATIVE 06/24/2020 2028   LEUKOCYTESUR NEGATIVE 06/24/2020 2028    Radiological Exams on Admission: CT Chest W Contrast  Result Date: 06/24/2020 CLINICAL DATA:  Metastatic cancer with back pain for couple of months. Pain is worse with moving. Weakness. EXAM: CT CHEST, ABDOMEN, AND PELVIS WITH CONTRAST TECHNIQUE: Multidetector CT imaging of the chest, abdomen and pelvis was performed following the standard protocol during bolus administration of intravenous contrast. CONTRAST:  144m OMNIPAQUE IOHEXOL 300 MG/ML  SOLN COMPARISON:  CT chest abdomen and pelvis 03/26/2020 FINDINGS: CT CHEST FINDINGS Cardiovascular: Mild cardiac enlargement. No pericardial effusions. Normal caliber thoracic aorta. No aortic dissection. Great vessel origins are patent. Good opacification of the central and segmental pulmonary arteries. No evidence of pulmonary embolus. Mediastinum/Nodes: Esophagus is decompressed. No significant lymphadenopathy. Right-sided Infuse-A-Port with tip in the superior vena cava. Lungs/Pleura: Thick-walled cavitary lesion with spiculation in the left apex measuring 3.1 x 3.4 cm in diameter. The lesion is mildly enlarged since the previous study. Mild scarring or nodularity in the right apex. No consolidation or edema. Scattered emphysematous changes. Slight fibrosis along the anterior left chest wall may represent radiation change if there is a history. No pleural effusions. No pneumothorax. Musculoskeletal: Calcifications in the left breast. Normal alignment of the thoracic spine. Degenerative changes. No vertebral compression deformities. Poorly defined vague lucency in the T12 vertebral body with slight irregularity of the cortical surface and mild adjacent soft tissue swelling. This is likely bone metastasis. CT ABDOMEN PELVIS FINDINGS Hepatobiliary: Prominent cirrhotic  changes throughout the liver with right lobe atrophy and enlarged left lobe and caudate lobe. Prominent nodular contour with fibrotic bands. Multiple peripherally enhancing lesions throughout the liver. Largest is in segment 4, measuring about 2.4 cm diameter but most are and the 1-2 cm diameter range. This is likely metastatic disease. Progression since the previous study. Gallbladder and bile ducts are unremarkable. Pancreas: Unremarkable. No pancreatic ductal dilatation  or surrounding inflammatory changes. Spleen: Normal in size without focal abnormality. Adrenals/Urinary Tract: Left adrenal gland nodule measuring 9 mm diameter, indeterminate. No significant change. Kidneys are symmetrical. Nephrograms are homogeneous. No hydronephrosis or hydroureter. Bladder is normal. Stomach/Bowel: The stomach, small bowel, and colon are not abnormally distended. Stool fills the colon. Appendix is not identified. Vascular/Lymphatic: Calcification of the aorta. No aneurysm. Inferior vena caval filter. No significant retroperitoneal lymphadenopathy. Reproductive: Calcified fibroids in the uterus. No abnormal adnexal masses. Other: No free air or free fluid in the abdomen. Abdominal wall musculature appears intact. Musculoskeletal: Sclerosis adjacent to the pedicle of L4 and multiple poorly defined lucent lesions in the pelvis bilaterally, most prominent in the right superior pubic ramus, likely metastatic. No vertebral compression deformities. Degenerative changes in the hips. IMPRESSION: 1. Thick-walled cavitary lesion in the left apex measuring 3.1 x 3.4 cm in diameter, mildly enlarged since the previous study. 2. Prominent cirrhotic changes throughout the liver. Multiple peripherally enhancing lesions throughout the liver, likely metastatic disease. Progression since the previous study. 3. Multiple poorly defined lucent lesions in the pelvis and T12 vertebral body, likely metastatic. 4. No evidence of pulmonary embolus or  aortic dissection. 5. 9 mm diameter left adrenal gland nodule, indeterminate. No significant change. 6. Calcified fibroids in the uterus. 7. Inferior vena caval filter. 8. Emphysema and aortic atherosclerosis. Aortic Atherosclerosis (ICD10-I70.0) and Emphysema (ICD10-J43.9). Electronically Signed   By: Lucienne Capers M.D.   On: 06/24/2020 23:01   CT ABDOMEN PELVIS W CONTRAST  Result Date: 06/24/2020 CLINICAL DATA:  Metastatic cancer with back pain for couple of months. Pain is worse with moving. Weakness. EXAM: CT CHEST, ABDOMEN, AND PELVIS WITH CONTRAST TECHNIQUE: Multidetector CT imaging of the chest, abdomen and pelvis was performed following the standard protocol during bolus administration of intravenous contrast. CONTRAST:  134m OMNIPAQUE IOHEXOL 300 MG/ML  SOLN COMPARISON:  CT chest abdomen and pelvis 03/26/2020 FINDINGS: CT CHEST FINDINGS Cardiovascular: Mild cardiac enlargement. No pericardial effusions. Normal caliber thoracic aorta. No aortic dissection. Great vessel origins are patent. Good opacification of the central and segmental pulmonary arteries. No evidence of pulmonary embolus. Mediastinum/Nodes: Esophagus is decompressed. No significant lymphadenopathy. Right-sided Infuse-A-Port with tip in the superior vena cava. Lungs/Pleura: Thick-walled cavitary lesion with spiculation in the left apex measuring 3.1 x 3.4 cm in diameter. The lesion is mildly enlarged since the previous study. Mild scarring or nodularity in the right apex. No consolidation or edema. Scattered emphysematous changes. Slight fibrosis along the anterior left chest wall may represent radiation change if there is a history. No pleural effusions. No pneumothorax. Musculoskeletal: Calcifications in the left breast. Normal alignment of the thoracic spine. Degenerative changes. No vertebral compression deformities. Poorly defined vague lucency in the T12 vertebral body with slight irregularity of the cortical surface and mild  adjacent soft tissue swelling. This is likely bone metastasis. CT ABDOMEN PELVIS FINDINGS Hepatobiliary: Prominent cirrhotic changes throughout the liver with right lobe atrophy and enlarged left lobe and caudate lobe. Prominent nodular contour with fibrotic bands. Multiple peripherally enhancing lesions throughout the liver. Largest is in segment 4, measuring about 2.4 cm diameter but most are and the 1-2 cm diameter range. This is likely metastatic disease. Progression since the previous study. Gallbladder and bile ducts are unremarkable. Pancreas: Unremarkable. No pancreatic ductal dilatation or surrounding inflammatory changes. Spleen: Normal in size without focal abnormality. Adrenals/Urinary Tract: Left adrenal gland nodule measuring 9 mm diameter, indeterminate. No significant change. Kidneys are symmetrical. Nephrograms are homogeneous. No hydronephrosis or hydroureter.  Bladder is normal. Stomach/Bowel: The stomach, small bowel, and colon are not abnormally distended. Stool fills the colon. Appendix is not identified. Vascular/Lymphatic: Calcification of the aorta. No aneurysm. Inferior vena caval filter. No significant retroperitoneal lymphadenopathy. Reproductive: Calcified fibroids in the uterus. No abnormal adnexal masses. Other: No free air or free fluid in the abdomen. Abdominal wall musculature appears intact. Musculoskeletal: Sclerosis adjacent to the pedicle of L4 and multiple poorly defined lucent lesions in the pelvis bilaterally, most prominent in the right superior pubic ramus, likely metastatic. No vertebral compression deformities. Degenerative changes in the hips. IMPRESSION: 1. Thick-walled cavitary lesion in the left apex measuring 3.1 x 3.4 cm in diameter, mildly enlarged since the previous study. 2. Prominent cirrhotic changes throughout the liver. Multiple peripherally enhancing lesions throughout the liver, likely metastatic disease. Progression since the previous study. 3. Multiple  poorly defined lucent lesions in the pelvis and T12 vertebral body, likely metastatic. 4. No evidence of pulmonary embolus or aortic dissection. 5. 9 mm diameter left adrenal gland nodule, indeterminate. No significant change. 6. Calcified fibroids in the uterus. 7. Inferior vena caval filter. 8. Emphysema and aortic atherosclerosis. Aortic Atherosclerosis (ICD10-I70.0) and Emphysema (ICD10-J43.9). Electronically Signed   By: Lucienne Capers M.D.   On: 06/24/2020 23:01      Assessment/Plan  Significant leukocytosis in the setting of metastatic breast cancer Patient presented with WBC of 70.4 CT chest and CT abdomen does not show any sources of infection.  Patient also does not have any symptoms of infection per HPI. Her primary oncologist Dr.Brahmanday thinks this could be related to her chemotherapy.  Will need formal consult with oncology tomorrow. Blood cultures are pending  Metastatic breast cancer to brain/lung For CT chest and CT abdomen it seems patient has worsening progression of her metastatic disease.  There is some poorly defined lucent lesions in her pelvic T12 vertebral body that is likely metastatic. Appreciate further recommendation from oncology  History of PE/DVT Continue Eliquis  Hx of SVT continue flecainide  Hx of seizure due to leptomeningeal metastatic disease Continue Keppra  DVT prophylaxis:.Eliquis Code Status: Full Family Communication: Plan discussed with patient at bedside  disposition Plan: Home with observation Consults called:  Admission status: Observation  Status is: Observation  The patient remains OBS appropriate and will d/c before 2 midnights.  Dispo: The patient is from: Home              Anticipated d/c is to: Home              Anticipated d/c date is: 1 day              Patient currently is not medically stable to d/c.         Orene Desanctis DO [facility]   If 7PM-7AM, please contact  night-coverage [email or web address]   06/25/2020, 12:21 AM

## 2020-06-25 NOTE — Assessment & Plan Note (Addendum)
#  64 year old female patient with with widespread metastatic breast cancer with multiple medical problems is currently admitted to hospital for generalized weakness/left hip pain; incidentally noted to have elevated white count of 60,000.  # Recurrent metastatic breast cancer-lung/liver metastases ER PR positive HER-2 negative-most recently on gemcitabine.  June 24, 2020/inpatient CT scan chest and pelvis-progressive disease in the lung/bones.;  Bone scan shows multiple bony metastases.  Long discussion with patient given the progression of disease-options include Doxil [see below] vs. best supportive care given patient declining performance status/multiple comorbidities etc.  Will review further at next clinic visit.  # Leukocytosis white count 60,000 predominant neutrophilia likely growth factor support [clinically less likely of infectious etiology]-slowly trending down.  Yesterday white count 45.  # Gait instability- worse [Hx of LP disease/RT]-progressive getting worse again likely secondary to progressive malignancy; s/p physical therapy evaluation.  Awaiting placement today.  # cardiomyopathy/ Sinus tachycardia- on metoprolol-/Tamabcor; Luetta Nutting 2021- 2d-echo- 40-45%]-2D echo-September 2021-50 to 55% ejection fraction.  Will need to monitor closely on Doxil.  # Elevated LFTs/alkaline phosphatase 510-likely secondary to progressive malignancy.  Stable  # Peripheral neuropathy grade 1-2-  on Lyrica.  Stable  # Right lower extremity DVT bilateral [November 28, 2019] CT scan abdomen pelvis negative for any DVT.  On eliquis.  Stable  #Overall prognosis: Is unfortunately poor given s/p multiple lines of therapies; bulky disease; borderline performance status; multiple comorbidities including cardiomyopathy.  Patient understand that she is high risk of death/complications from chemotherapy. Discussed the potential side effects including but not limited to-increasing fatigue, nausea vomiting, diarrhea,  hair loss, sores in the mouth, increase risk of infection and also neuropathy; cardiac complications was also discussed.  Patient is still interested in pursuing chemo; however will reassess next week in the clinic to decide if you want to proceed with chemotherapy. Will discuss with pt's brother.  Discussed with Dr.Amery-stable from oncology standpoint to be discharged to SNF.

## 2020-06-25 NOTE — Telephone Encounter (Signed)
On 9/27- spoke to ER doctor- white count- infection vs recent GCSF- needs further work up- including CT-C/A/P to assess pt's malignancy status. Pt admitted to hospital.   GB

## 2020-06-25 NOTE — Progress Notes (Addendum)
Brief hospitalist update note.  This is a nonbillable note.  Please see same-day H&P for full billable details.  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  She is currently on gemcitabine.  Primary oncologist is Dr. Rogue Bussing.  Patient presented to the emergency department due to acute worsening of her progressive weakness.  Patient states her symptoms started approximately 3 months ago where she fell on the lawn and was down for an extended period of time.  The day prior to admission she was unable to get up from her chair and had to call EMS to help her.  On presentation her white blood cell count was noted to be severely elevated.  Oncology was contacted and felt this may be due secondary to her chemotherapy and exposure to GM-CSF.  Recommended CT scan of chest abdomen pelvis.  This was performed.  She has a known cavitary lesion in the left lung apex that has increased mildly in size also has findings of cirrhotic appearing liver.  No clear source of infection noted.  No fevers.  No antibiotics were indicated on admission.  Patient appears to have limited functional status and overall functional decline in the setting of metastatic cancer on chemotherapy.  I have called consultation with patient's oncologist Dr. Rogue Bussing.  His evaluation and recommendations are appreciated.  In the meantime I will get consultations from physical therapy, Occupational Therapy, nutrition.  I offered to call family contact for an update.  The patient declined.  Ralene Muskrat MD

## 2020-06-25 NOTE — Evaluation (Signed)
Physical Therapy Evaluation Patient Details Name: Sarah Horne MRN: [id number] DOB: 1955/10/07 Today's Date: 06/25/2020   History of Present Illness  64 y.o. female with medical history significant for metastatic breast cancer/leptomeningeal disease metastatic to brain on gemcitabine, hx of seizure, SVT, cardiomyopathy, PE/DVT on Eliquis, history of GI bleed and hypertension who presents with concerns of weakness    Clinical Impression  Patient received in bed, sleeping upon arrival. She required increased time for mobility and answering questions initially due to lethargy. This improved with time. She requires min guard/supervision for bed mobility and transfers with mod assist for initial standing balance. She has heavy posterior lean initially requiring mod assist, cues to correct. Patient was able to ambulate 20-25 feet with RW and min guard. She will continue to benefit from skilled PT while here to improve functional independence and safety with mobility.        Follow Up Recommendations Supervision/Assistance - 24 hour;Home health PT    Equipment Recommendations  None recommended by PT    Recommendations for Other Services       Precautions / Restrictions Precautions Precautions: Fall Restrictions Weight Bearing Restrictions: No      Mobility  Bed Mobility Overal bed mobility: Needs Assistance Bed Mobility: Supine to Sit;Sit to Supine     Supine to sit: Supervision Sit to supine: Supervision      Transfers Overall transfer level: Needs assistance Equipment used: Rolling walker (2 wheeled) Transfers: Sit to/from Stand Sit to Stand: Mod assist         General transfer comment: PT providing Mod A with significant posterior lean requiring VC for improving static standing balance and RW mgt; +1 for safety/lines/leads  Ambulation/Gait Ambulation/Gait assistance: Min guard Gait Distance (Feet): 20 Feet Assistive device: Rolling walker (2 wheeled) Gait  Pattern/deviations: Step-through pattern;Decreased stride length;Decreased step length - right;Decreased step length - left Gait velocity: decreased   General Gait Details: slow, guarded, hesitant gait  Stairs            Wheelchair Mobility    Modified Rankin (Stroke Patients Only)       Balance Overall balance assessment: Needs assistance Sitting-balance support: Feet supported Sitting balance-Leahy Scale: Good   Postural control: Posterior lean Standing balance support: Bilateral upper extremity supported;During functional activity Standing balance-Leahy Scale: Poor Standing balance comment: required significant initial assist for static standing balance but improved with time/cues. Posterior lean with initial standing                             Pertinent Vitals/Pain Pain Assessment: No/denies pain    Home Living Family/patient expects to be discharged to:: Private residence Living Arrangements: Alone Available Help at Discharge: Family;Available PRN/intermittently Type of Home: House Home Access: Stairs to enter Entrance Stairs-Rails: Left Entrance Stairs-Number of Steps: 3 Home Layout: One level Home Equipment: Walker - 4 wheels;Shower seat;Bedside commode      Prior Function Level of Independence: Independent with assistive device(s)   Gait / Transfers Assistance Needed: Pt reports using 4WW "most of the time" for mobility; endorses 1 fall  ADL's / Homemaking Assistance Needed: Pt indep with basic ADL, seated showers; brother assists with med mgt, and gets "[employer]" transportation for doctor appts        Hand Dominance   Dominant Hand: Right    Extremity/Trunk Assessment   Upper Extremity Assessment Upper Extremity Assessment: Defer to OT evaluation    Lower Extremity Assessment Lower Extremity Assessment: Generalized  weakness    Cervical / Trunk Assessment Cervical / Trunk Assessment: Other exceptions Cervical / Trunk Exceptions:  forward head  Communication   Communication: No difficulties  Cognition Arousal/Alertness: Awake/alert Behavior During Therapy: WFL for tasks assessed/performed Overall Cognitive Status: No family/caregiver present to determine baseline cognitive functioning                                 General Comments: alert and generally oriented after initially waking up, follows commands; slightly increased processing time noted      General Comments General comments (skin integrity, edema, etc.): supine BP 101/63, sit 106/73, std 110/85    Exercises     Assessment/Plan    PT Assessment Patient needs continued PT services  PT Problem List Decreased strength;Decreased mobility;Decreased safety awareness;Decreased activity tolerance;Decreased balance       PT Treatment Interventions DME instruction;Therapeutic activities;Gait training;Therapeutic exercise;Patient/family education;Functional mobility training    PT Goals (Current goals can be found in the Care Plan section)  Acute Rehab PT Goals Patient Stated Goal: go home PT Goal Formulation: With patient Time For Goal Achievement: 07/09/20 Potential to Achieve Goals: Good    Frequency Min 2X/week   Barriers to discharge Decreased caregiver support;Inaccessible home environment patient lives alone and has steps to enter house.    Co-evaluation PT/OT/SLP Co-Evaluation/Treatment: Yes Reason for Co-Treatment: For patient/therapist safety;Necessary to address cognition/behavior during functional activity;To address functional/ADL transfers PT goals addressed during session: Mobility/safety with mobility;Balance OT goals addressed during session: Proper use of Adaptive equipment and DME;ADL's and self-care       AM-PAC PT "6 Clicks" Mobility  Outcome Measure Help needed turning from your back to your side while in a flat bed without using bedrails?: A Little Help needed moving from lying on your back to sitting on the  side of a flat bed without using bedrails?: A Little Help needed moving to and from a bed to a chair (including a wheelchair)?: A Little Help needed standing up from a chair using your arms (e.g., wheelchair or bedside chair)?: A Little Help needed to walk in hospital room?: A Little Help needed climbing 3-5 steps with a railing? : A Lot 6 Click Score: 17    End of Session Equipment Utilized During Treatment: Gait belt Activity Tolerance: Patient tolerated treatment well Patient left: in bed;with call bell/phone within reach Nurse Communication: Mobility status;Other (comment) (needs pure wick replaced) PT Visit Diagnosis: Unsteadiness on feet (R26.81);Muscle weakness (generalized) (M62.81);Difficulty in walking, not elsewhere classified (R26.2);Other abnormalities of gait and mobility (R26.89);History of falling (Z91.81)    Time: 5361-4431 PT Time Calculation (min) (ACUTE ONLY): 24 min   Charges:   PT Evaluation $PT Eval Moderate Complexity: 1 Mod          Kagen Kunath, PT, GCS 06/25/20,2:25 PM

## 2020-06-25 NOTE — ED Notes (Signed)
Pt resting after breakfast. No complaints.

## 2020-06-25 NOTE — ED Notes (Signed)
Late entry- assumed care of pt upon being roomed. Pt 2-3 assist to stand and pivot to toilet. Assisted into bed, placed on BP and O2 monitor. Denies pain upon being roomed and at this time. Warm blankets provided. Pt tolerated PO contrast and CT well. Purwick placed for ease in voiding. Medicated per [facility], awaiting pharmacy to verify additional medications. AO x4. Talking in full sentences with regular and unlabored breathing.

## 2020-06-25 NOTE — Evaluation (Signed)
Occupational Therapy Evaluation Patient Details Name: Sarah Horne MRN: [id number] DOB: [birth year] Today's Date: 06/25/2020    History of Present Illness 64 y.o. female with medical history significant for metastatic breast cancer/leptomeningeal disease metastatic to brain on gemcitabine, hx of seizure, SVT, cardiomyopathy, PE/DVT on Eliquis, history of GI bleed and hypertension who presents with concerns of weakness   Clinical Impression   Pt was seen for OT/PT co- evaluation this date. (Per chart, nursing noting 2-3 person assist for mobility earlier). Prior to hospital admission, pt was living alone and generally able to perform basic ADL tasks without assist. Since last evaluation at this hospital by OT approx 26mo ago, pt now has assist for medication mgt from brother and uses "[name]" for transportation to doctors appts. Pt endorses 1 fall in past 12 months. Pt initially sleeping but easily wakes to therapists arrival. Pt performs bed mobility with supervision-CGA and able to sit EOB with SBA (slight posterior lean and corrects with VC). Mod A for STS transfer (OT providing +1 for safety and line/leads mgt) with significant posterior lean upon initial standing requiring assist and VC for RW mgt and improving balance. Once up, pt required CGA for ADL mobility in room with RW. Currently pt demonstrates impairments as described below (See OT problem list) which functionally limit her ability to perform ADL/self-care tasks at baseline levels. Pt currently requires Min A for LB ADL, Mod A for ADL transfers.  Pt would benefit from skilled OT services to address noted impairments and functional limitations (see below for any additional details) in order to maximize safety and independence while minimizing falls risk and caregiver burden. Upon hospital discharge, recommend [name] + 24/7 supervision/assist for safety to maximize pt safety and return to functional independence during meaningful  occupations of daily life.      Follow Up Recommendations  Home health OT;Supervision/Assistance - 24 hour    Equipment Recommendations  None recommended by OT    Recommendations for Other Services       Precautions / Restrictions Precautions Precautions: Fall Restrictions Weight Bearing Restrictions: No      Mobility Bed Mobility Overal bed mobility: Needs Assistance Bed Mobility: Supine to Sit;Sit to Supine     Supine to sit: Supervision;HOB elevated Sit to supine: Supervision      Transfers Overall transfer level: Needs assistance Equipment used: Rolling walker (2 wheeled) Transfers: Sit to/from Stand Sit to Stand: Mod assist         General transfer comment: PT providing Mod A with significant posterior lean requiring VC for improving static standing balance and RW mgt; +1 for safety/lines/leads    Balance Overall balance assessment: Needs assistance Sitting-balance support: No upper extremity supported;Feet supported Sitting balance-Leahy Scale: Fair   Postural control: Posterior lean Standing balance support: Bilateral upper extremity supported Standing balance-Leahy Scale: Poor Standing balance comment: required significant initial assist for static standing balance but improved with time/cues                           ADL either performed or assessed with clinical judgement   ADL Overall ADL's : Needs assistance/impaired                                       General ADL Comments: Pt requires Min A for LB ADL tasks, Min A for ADL transfers, CGA for ADL mobility  w VC for RW mgt; set up for meals 2/2 cataracts/low vision     Vision Baseline Vision/History: Wears glasses;Cataracts (both eyes) Wears Glasses: At all times ("should" wear them all the time but doesn't - cataracts make the glasses not really effective) Patient Visual Report: No change from baseline Additional Comments: cataracts impairing pt's vision      Perception     Praxis      Pertinent Vitals/Pain Pain Assessment: No/denies pain     Hand Dominance Right   Extremity/Trunk Assessment Upper Extremity Assessment Upper Extremity Assessment: Generalized weakness   Lower Extremity Assessment Lower Extremity Assessment: Generalized weakness   Cervical / Trunk Assessment Cervical / Trunk Assessment: Kyphotic   Communication Communication Communication: No difficulties   Cognition Arousal/Alertness: Awake/alert Behavior During Therapy: WFL for tasks assessed/performed Overall Cognitive Status: No family/caregiver present to determine baseline cognitive functioning                                 General Comments: alert and generally oriented after initially waking up, follows commands; slightly increased processing time noted   General Comments  supine BP 101/63, sit 106/73, std 110/85    Exercises     Shoulder Instructions      Home Living Family/patient expects to be discharged to:: Private residence Living Arrangements: Alone Available Help at Discharge: Family;Available PRN/intermittently (brother) Type of Home: House Home Access: Stairs to enter [employer] of Steps: 3 Entrance Stairs-Rails: Left Home Layout: One level     Bathroom Shower/Tub: Teacher, early years/pre: Standard     Home Equipment: Environmental consultant - 4 wheels;Shower seat;Bedside commode          Prior Functioning/Environment Level of Independence: Needs assistance  Gait / Transfers Assistance Needed: Pt reports using 4WW "most of the time" for mobility; endorses 1 fall ADL's / Homemaking Assistance Needed: Pt indep with basic ADL, seated showers; brother assists with med mgt, and gets "[name]" transportation for doctor appts            OT Problem List: Decreased strength;Decreased activity tolerance;Decreased knowledge of use of DME or AE;Impaired balance (sitting and/or standing);Impaired  vision/perception;Decreased safety awareness      OT Treatment/Interventions: Self-care/ADL training;Therapeutic exercise;Therapeutic activities;Energy conservation;DME and/or AE instruction;Patient/family education;Balance training    OT Goals(Current goals can be found in the care plan section) Acute Rehab OT Goals Patient Stated Goal: go home OT Goal Formulation: With patient Time For Goal Achievement: 07/09/20 Potential to Achieve Goals: Good ADL Goals Pt Will Perform Lower Body Dressing: with supervision;sit to/from stand Pt Will Transfer to Toilet: with min guard assist;ambulating;regular height toilet (LRAD for amb) Additional ADL Goal #1: Pt will verbalize plan to implement at least 1 learned falls prevention strategy.  OT Frequency: Min 1X/week   Barriers to D/C: Decreased caregiver support          Co-evaluation PT/OT/SLP Co-Evaluation/Treatment: Yes Reason for Co-Treatment: For patient/therapist safety;To address functional/ADL transfers PT goals addressed during session: Mobility/safety with mobility;Balance;Proper use of DME OT goals addressed during session: Proper use of Adaptive equipment and DME;ADL's and self-care      AM-PAC OT "6 Clicks" Daily Activity     Outcome Measure Help from another person eating meals?: None Help from another person taking care of personal grooming?: None Help from another person toileting, which includes using toliet, bedpan, or urinal?: A Little Help from another person bathing (including washing, rinsing, drying)?: A  Lot Help from another person to put on and taking off regular upper body clothing?: None Help from another person to put on and taking off regular lower body clothing?: A Lot 6 Click Score: 19   End of Session Equipment Utilized During Treatment: Gait belt;Rolling walker Nurse Communication: Mobility status  Activity Tolerance: Patient tolerated treatment well Patient left: in bed;with call bell/phone within  reach  OT Visit Diagnosis: Other abnormalities of gait and mobility (R26.89);History of falling (Z91.81);Muscle weakness (generalized) (M62.81);Low vision, both eyes (H54.2)                Time: 3779-3968 OT Time Calculation (min): 24 min Charges:  OT General Charges $OT Visit: 1 Visit OT Evaluation $OT Eval Moderate Complexity: 1 Mod  Jeni Salles, MPH, MS, OTR/L ascom [phone] 06/25/20, 2:14 PM

## 2020-06-25 NOTE — Consult Note (Signed)
[facility] CONSULT NOTE  Patient Care Team: Delsa Grana, PA-C as PCP - General (Family Medicine) Wellington Hampshire, MD as PCP - Cardiology (Cardiology) Bary Castilla, Forest Gleason, MD (General Surgery) Vladimir Crofts, MD as Consulting Physician (Neurology) Cammie Sickle, MD as Consulting Physician (Internal Medicine)  CHIEF COMPLAINTS/PURPOSE OF CONSULTATION: Metastatic breast cancer  HISTORY OF PRESENTING ILLNESS:  Sarah Horne 64 y.o.  female patient with multiple medical problems including history of metastatic breast cancer with extensive metastasis to brain liver bone; history of cardiomyopathy; DVT PE on Eliquis/IVC filter; longstanding gait instability is currently admitted to hospital for generalized weakness/leukocytosis 60,000.  Patient received gemcitabine chemotherapy approximately 5 days ago; followed by Neulasta.  Patient denies any significant fevers.  Denies any nausea vomiting.  Complains of poor appetite.  Complains of worsening back pain in the lower/and also left hip pain.  She noted to have difficulty with ambulation.  No falls.    Review of Systems  Constitutional: Positive for malaise/fatigue and weight loss. Negative for chills, diaphoresis and fever.  HENT: Negative for nosebleeds and sore throat.   Eyes: Negative for double vision.  Respiratory: Positive for shortness of breath. Negative for cough, hemoptysis, sputum production and wheezing.   Cardiovascular: Negative for chest pain, palpitations, orthopnea and leg swelling.  Gastrointestinal: Negative for abdominal pain, blood in stool, constipation, diarrhea, heartburn, melena, nausea and vomiting.  Genitourinary: Negative for dysuria, frequency and urgency.  Musculoskeletal: Positive for back pain and joint pain.  Skin: Negative.  Negative for itching and rash.  Neurological: Positive for dizziness, tingling, tremors and weakness. Negative for focal weakness and headaches.   Endo/Heme/Allergies: Does not bruise/bleed easily.  Psychiatric/Behavioral: Negative for depression. The patient is not nervous/anxious and does not have insomnia.      MEDICAL HISTORY:  Past Medical History:  Diagnosis Date  . Chemotherapy-induced peripheral neuropathy ([place])   . Epistaxis    a. 11/2016 in setting of asa/plavix-->silver nitrate cauterization.  . GI bleed    a. 11/2016 Admission w/ GIB and hypovolemic shock req 3u PRBC's;  b. 11/2016 ECG: gastritis & nonbleeding peptic ulcer; c. 11/2016 Conlonoscopy: rectal and sigmoid colonic ulcers.  . Heart attack ([place])    a. 1998 Cath @ [facility]: reportedly no intervention required.  Marland Kitchen Herceptin-induced cardiomyopathy ([place])    a. In the setting of Herceptin Rx for breast cancer (initiated 12/2014); b. 03/2015 MUGA EF 64%; b. 08/2015 MUGA: EF 51%; c. 10/2015 MUGA: EF 44%; d. 11/2015 Echo: EF 45-50%; e. 01/2016 MUGA: EF 60%; f. 06/2016 MUGA EF 65%; g. 10/2016 MUGA: EF 61%;  h. 12/2016 Echo: EF 55-60%, gr1 DD.  Marland Kitchen Hyperlipidemia   . Hypertension   . Neuropathy   . Possible PAD (peripheral artery disease) ([place])    a. 11/2016 LE cyanosis and weak pulses-->CTA w/o significant Ao-BiFem dzs. ? distal dzs-->ASA/Plavix initiated by vascular surgery.  Marland Kitchen PSVT (paroxysmal supraventricular tachycardia) ([place])    a. Dx 11/2016.  . Pulmonary embolism ([place])    a. 12/2016 CTA Chest: small nonocclusive PE in inferior segment of the Left lingula, somewhat eccentric filling defect suggesting chronic rather than acute embolic event; b. 10/1306 LE U/S:  No DVT; c. 12/2016 Echo: Nl RV fxn, nl PASP.  Marland Kitchen Recurrent Metastatic breast cancer ([place])    a. Dx 2016: Stage II, ER positive, PR positive, HER-2/neu overexpressing of the left breast-->chemo/radiation; b. 10/2016 CT Abd/pelvis: diffuse liver mets, ill defined sclerotic bone lesions-T12;  c. 10/2016 MRI brain: metastatic lesion along  L temporal lobe (19x60m) w/ extensive surrounding edema & 512mmidline shift to right.  . Sepsis  [place]  . Sinus tachycardia     SURGICAL HISTORY: Past Surgical History:  Procedure Laterality Date  . BREAST BIOPSY Left 2016   Positive  . BREAST LUMPECTOMY WITH SENTINEL LYMPH NODE BIOPSY Left 05/23/2015   Procedure: LEFT BREAST WIDE EXCISION WITH AXILLARY DISSECTION, MASTOPLASTY ;  Surgeon: JeRobert BellowMD;  Location: [facility] ORS;  Service: General;  Laterality: Left;  . BREAST SURGERY Left 12/18/14   breast biopsy/INVASIVE DUCTAL CARCINOMA OF BREAST, NOTTINGHAM GRADE 2.  . Marland KitchenREAST SURGERY  05/23/2015.   Wide excision/mastoplasty, axillary dissection. No residual invasive cancer, positive for residual DCIS. 0/2 nodes identified on axillary dissection. (no SLN by technetium or methylene blue)  . CARDIAC CATHETERIZATION    . COLONOSCOPY WITH PROPOFOL N/A 12/10/2016   Procedure: COLONOSCOPY WITH PROPOFOL;  Surgeon: DaLucilla LameMD;  Location: [facility] ENDOSCOPY;  Service: Endoscopy;  Laterality: N/A;  . COLONOSCOPY WITH PROPOFOL N/A 02/13/2017   Procedure: COLONOSCOPY WITH PROPOFOL;  Surgeon: WoLucilla LameMD;  [facility];  Service: Endoscopy;  Laterality: N/A;  . ESOPHAGOGASTRODUODENOSCOPY N/A 11/03/2019   Procedure: ESOPHAGOGASTRODUODENOSCOPY (EGD);  Surgeon: VaLin LandsmanMD;  [facility];  Service: Gastroenterology;  Laterality: N/A;  . ESOPHAGOGASTRODUODENOSCOPY (EGD) WITH PROPOFOL N/A 12/08/2016   Procedure: ESOPHAGOGASTRODUODENOSCOPY (EGD) WITH PROPOFOL;  Surgeon: DaLucilla LameMD;  Location: [facility];  Service: Endoscopy;  Laterality: N/A;  . IVC FILTER INSERTION N/A 02/15/2017   Procedure: IVC Filter Insertion;  Surgeon: DeAlgernon HuxleyMD;  [facility];  Service: Cardiovascular;  Laterality: N/A;  . PORTACATH PLACEMENT Right 12-31-14   Dr ByBary Castilla  SOCIAL HISTORY: Social History   Socioeconomic History  . Marital status: Divorced    Spouse name: Not on file  . Number of children: Not on file  . Years of education: Not on  file  . Highest education level: Not on file  Occupational History  . Not on file  Tobacco Use  . Smoking status: Light Tobacco Smoker    Packs/day: 0.25    Years: 18.00    Pack years: 4.50    Types: Cigarettes  . Smokeless tobacco: Never Used  Vaping Use  . Vaping Use: Never used  Substance and Sexual Activity  . Alcohol use: No    Alcohol/week: 0.0 standard drinks  . Drug use: No  . Sexual activity: Not on file  Other Topics Concern  . Not on file  Social History Narrative   [place]   Social Determinants of Health   Financial Resource Strain: Low Risk   . Difficulty of Paying Living Expenses: Not very hard  Food Insecurity: No Food Insecurity  . Worried About RuCharity fundraisern the Last Year: Never true  . Ran Out of Food in the Last Year: Never true  Transportation Needs: No Transportation Needs  . Lack of Transportation (Medical): No  . Lack of Transportation (Non-Medical): No  Physical Activity: Insufficiently Active  . Days of Exercise per Week: 7 days  . Minutes of Exercise per Session: 20 min  Stress: No Stress Concern Present  . Feeling of Stress : Not at all  Social Connections: Unknown  . Frequency of Communication with Friends and Family: Patient refused  . Frequency of Social Gatherings with Friends and Family: Patient refused  . Attends Religious Services: Patient refused  . Active Member of Clubs  or Organizations: Patient refused  . Attends Archivist Meetings: Patient refused  . Marital Status: Divorced  Human resources officer Violence: Not At Risk  . Fear of Current or Ex-Partner: No  . Emotionally Abused: No  . Physically Abused: No  . Sexually Abused: No    FAMILY HISTORY: Family History  Problem Relation Age of Onset  . Breast cancer Maternal Aunt   . Breast cancer Cousin   . Brain cancer Maternal Uncle   . Diabetes Mother   . Hypertension Mother   . Stroke Mother     ALLERGIES:  is allergic to no known  allergies.  MEDICATIONS:  Current Facility-Administered Medications  Medication Dose Route Frequency Provider Last Rate Last Admin  . apixaban (ELIQUIS) tablet 5 mg  5 mg Oral BID Tu, Ching T, DO   5 mg at 06/25/20 0851  . flecainide (TAMBOCOR) tablet 50 mg  50 mg Oral BID Tu, Ching T, DO   50 mg at 06/25/20 0852  . levETIRAcetam (KEPPRA) tablet 250 mg  250 mg Oral BID Tu, Ching T, DO   250 mg at 06/25/20 0851  . loratadine (CLARITIN) tablet 10 mg  10 mg Oral Daily Tu, Ching T, DO   10 mg at 06/25/20 0850  . magnesium oxide (MAG-OX) tablet 400 mg  400 mg Oral BID Tu, Ching T, DO   400 mg at 06/25/20 0851  . metoprolol tartrate (LOPRESSOR) tablet 25 mg  25 mg Oral BID Tu, Ching T, DO   25 mg at 06/25/20 0851  . montelukast (SINGULAIR) tablet 10 mg  10 mg Oral QHS Tu, Ching T, DO      . pregabalin (LYRICA) capsule 100 mg  100 mg Oral BID Tu, Ching T, DO   100 mg at 06/25/20 0850  . Vitamin D (Ergocalciferol) (DRISDOL) capsule 50,000 Units  50,000 Units Oral Q7 days Tu, Ching T, DO   50,000 Units at 06/25/20 1610   Current Outpatient Medications  Medication Sig Dispense Refill  . ELIQUIS 5 MG TABS tablet TAKE 1 TABLET BY MOUTH TWICE A DAY 60 tablet 4  . flecainide (TAMBOCOR) 50 MG tablet Take 1 tablet (50 mg total) by mouth 2 (two) times daily. 60 tablet 4  . levETIRAcetam (KEPPRA) 250 MG tablet Take 1 tablet by mouth 2 (two) times daily.     Marland Kitchen levocetirizine (XYZAL) 5 MG tablet Take 1 tablet by mouth daily.    . magnesium oxide (MAG-OX) 400 MG tablet TAKE 1 TABLET BY MOUTH TWICE A DAY 60 tablet 4  . metoprolol tartrate (LOPRESSOR) 25 MG tablet TAKE 1 TABLET BY MOUTH TWICE A DAY 60 tablet 1  . montelukast (SINGULAIR) 10 MG tablet TAKE 1 TABLET BY MOUTH EVERYDAY AT BEDTIME 90 tablet 2  . pregabalin (LYRICA) 100 MG capsule TAKE 1 CAPSULE BY MOUTH TWICE A DAY 60 capsule 2  . Vitamin D, Ergocalciferol, (DRISDOL) 1.25 MG (50000 UNIT) CAPS capsule TAKE 1 CAPSULE (50,000 UNITS TOTAL) ONCE A WEEK BY  MOUTH. 12 capsule 1  . meclizine (ANTIVERT) 25 MG tablet Take 1 tablet (25 mg total) by mouth 3 (three) times daily as needed for dizziness. (Patient not taking: Reported on 06/24/2020) 30 tablet 0  . ondansetron (ZOFRAN) 8 MG tablet One pill every 8 hours as needed for nausea/vomitting. (Patient not taking: Reported on 06/24/2020) 40 tablet 1  . prochlorperazine (COMPAZINE) 10 MG tablet Take 1 tablet (10 mg total) by mouth every 6 (six) hours as needed for nausea or vomiting. (  Patient not taking: Reported on 06/24/2020) 40 tablet 1   Facility-Administered Medications Ordered in Other Encounters  Medication Dose Route Frequency Provider Last Rate Last Admin  . 0.9 %  sodium chloride infusion   Intravenous Continuous [name], MD   New Bag at 12/10/16 1140  . 0.9 %  sodium chloride infusion   Intravenous Continuous Lloyd Huger, MD 999 mL/hr at 04/24/15 1520 New Bag at 05/23/15 0951  . 0.9 %  sodium chloride infusion   Intravenous Continuous Verlon Au, NP 999 mL/hr at 10/31/19 1522 New Bag at 10/31/19 1522  . heparin lock flush 100 unit/mL  500 Units Intravenous Once Berenzon, Dmitriy, MD      . sodium chloride 0.9 % injection 10 mL  10 mL Intravenous PRN [name], MD   10 mL at 04/04/15 1440  . sodium chloride flush (NS) 0.9 % injection 10 mL  10 mL Intravenous PRN Berenzon, Dmitriy, MD          .  PHYSICAL EXAMINATION:  Vitals:   06/25/20 1400 06/25/20 1438  BP: 107/75 101/65  Pulse: 84 90  Resp:  18  Temp:    SpO2: 99% 98%   Filed Weights   06/24/20 1346 06/25/20 0908  Weight: 140 lb (63.5 kg) 140 lb (63.5 kg)    Physical Exam Constitutional:      Comments: Patient appears cachectic.  She is alone.  HENT:     Head: Normocephalic and atraumatic.     Mouth/Throat:     Pharynx: No oropharyngeal exudate.  Eyes:     Pupils: Pupils are equal, round, and reactive to light.  Cardiovascular:     Rate and Rhythm: Normal rate and regular rhythm.  Pulmonary:      Effort: No respiratory distress.     Breath sounds: No wheezing.     Comments: Decreased breath sounds at the bases.  No wheeze or crackles. Abdominal:     General: Bowel sounds are normal. There is no distension.     Palpations: Abdomen is soft. There is no mass.     Tenderness: There is no abdominal tenderness. There is no guarding or rebound.  Musculoskeletal:        General: No tenderness. Normal range of motion.     Cervical back: Normal range of motion and neck supple.  Skin:    General: Skin is warm.  Neurological:     Mental Status: She is alert and oriented to person, place, and time.  Psychiatric:        Mood and Affect: Affect normal.      LABORATORY DATA:  I have reviewed the data as listed Lab Results  Component Value Date   WBC 45.3 (H) 06/25/2020   HGB 13.1 06/25/2020   HCT 36.6 06/25/2020   MCV 94.1 06/25/2020   PLT 173 06/25/2020   Recent Labs    06/06/20 0847 06/06/20 0847 06/20/20 0840 06/24/20 1403 06/25/20 0554  NA 138   < > 139 139 138  K 3.8   < > 3.9 4.2 4.1  CL 100   < > 101 100 100  CO2 27   < > 29 28 23   GLUCOSE 86   < > 87 79 64*  BUN 18   < > 22 15 13   CREATININE 0.69   < > 0.68 0.70 0.60  CALCIUM 8.9   < > 8.9 9.3 9.0  GFRNONAA >60   < > >60 >60 >60  GFRAA >60   < > >  60 >60 >60  PROT 7.1  --  7.1 7.4  --   ALBUMIN 3.7  --  3.6 3.8  --   AST 58*  --  91* 98*  --   ALT 44  --  61* 80*  --   ALKPHOS 420*  --  491* 521*  --   BILITOT 0.9  --  1.0 1.9*  --   BILIDIR  --   --   --  0.5*  --   IBILI  --   --   --  1.4*  --    < > = values in this interval not displayed.    RADIOGRAPHIC STUDIES: I have personally reviewed the radiological images as listed and agreed with the findings in the report. CT Chest W Contrast  Result Date: 06/24/2020 CLINICAL DATA:  Metastatic cancer with back pain for couple of months. Pain is worse with moving. Weakness. EXAM: CT CHEST, ABDOMEN, AND PELVIS WITH CONTRAST TECHNIQUE: Multidetector CT  imaging of the chest, abdomen and pelvis was performed following the standard protocol during bolus administration of intravenous contrast. CONTRAST:  156m OMNIPAQUE IOHEXOL 300 MG/ML  SOLN COMPARISON:  CT chest abdomen and pelvis 03/26/2020 FINDINGS: CT CHEST FINDINGS Cardiovascular: Mild cardiac enlargement. No pericardial effusions. Normal caliber thoracic aorta. No aortic dissection. Great vessel origins are patent. Good opacification of the central and segmental pulmonary arteries. No evidence of pulmonary embolus. Mediastinum/Nodes: Esophagus is decompressed. No significant lymphadenopathy. Right-sided Infuse-A-Port with tip in the superior vena cava. Lungs/Pleura: Thick-walled cavitary lesion with spiculation in the left apex measuring 3.1 x 3.4 cm in diameter. The lesion is mildly enlarged since the previous study. Mild scarring or nodularity in the right apex. No consolidation or edema. Scattered emphysematous changes. Slight fibrosis along the anterior left chest wall may represent radiation change if there is a history. No pleural effusions. No pneumothorax. Musculoskeletal: Calcifications in the left breast. Normal alignment of the thoracic spine. Degenerative changes. No vertebral compression deformities. Poorly defined vague lucency in the T12 vertebral body with slight irregularity of the cortical surface and mild adjacent soft tissue swelling. This is likely bone metastasis. CT ABDOMEN PELVIS FINDINGS Hepatobiliary: Prominent cirrhotic changes throughout the liver with right lobe atrophy and enlarged left lobe and caudate lobe. Prominent nodular contour with fibrotic bands. Multiple peripherally enhancing lesions throughout the liver. Largest is in segment 4, measuring about 2.4 cm diameter but most are and the 1-2 cm diameter range. This is likely metastatic disease. Progression since the previous study. Gallbladder and bile ducts are unremarkable. Pancreas: Unremarkable. No pancreatic ductal  dilatation or surrounding inflammatory changes. Spleen: Normal in size without focal abnormality. Adrenals/Urinary Tract: Left adrenal gland nodule measuring 9 mm diameter, indeterminate. No significant change. Kidneys are symmetrical. Nephrograms are homogeneous. No hydronephrosis or hydroureter. Bladder is normal. Stomach/Bowel: The stomach, small bowel, and colon are not abnormally distended. Stool fills the colon. Appendix is not identified. Vascular/Lymphatic: Calcification of the aorta. No aneurysm. Inferior vena caval filter. No significant retroperitoneal lymphadenopathy. Reproductive: Calcified fibroids in the uterus. No abnormal adnexal masses. Other: No free air or free fluid in the abdomen. Abdominal wall musculature appears intact. Musculoskeletal: Sclerosis adjacent to the pedicle of L4 and multiple poorly defined lucent lesions in the pelvis bilaterally, most prominent in the right superior pubic ramus, likely metastatic. No vertebral compression deformities. Degenerative changes in the hips. IMPRESSION: 1. Thick-walled cavitary lesion in the left apex measuring 3.1 x 3.4 cm in diameter, mildly enlarged since the previous study.  2. Prominent cirrhotic changes throughout the liver. Multiple peripherally enhancing lesions throughout the liver, likely metastatic disease. Progression since the previous study. 3. Multiple poorly defined lucent lesions in the pelvis and T12 vertebral body, likely metastatic. 4. No evidence of pulmonary embolus or aortic dissection. 5. 9 mm diameter left adrenal gland nodule, indeterminate. No significant change. 6. Calcified fibroids in the uterus. 7. Inferior vena caval filter. 8. Emphysema and aortic atherosclerosis. Aortic Atherosclerosis (ICD10-I70.0) and Emphysema (ICD10-J43.9). Electronically Signed   By: Lucienne Capers M.D.   On: 06/24/2020 23:01   CT ABDOMEN PELVIS W CONTRAST  Result Date: 06/24/2020 CLINICAL DATA:  Metastatic cancer with back pain for couple  of months. Pain is worse with moving. Weakness. EXAM: CT CHEST, ABDOMEN, AND PELVIS WITH CONTRAST TECHNIQUE: Multidetector CT imaging of the chest, abdomen and pelvis was performed following the standard protocol during bolus administration of intravenous contrast. CONTRAST:  156m OMNIPAQUE IOHEXOL 300 MG/ML  SOLN COMPARISON:  CT chest abdomen and pelvis 03/26/2020 FINDINGS: CT CHEST FINDINGS Cardiovascular: Mild cardiac enlargement. No pericardial effusions. Normal caliber thoracic aorta. No aortic dissection. Great vessel origins are patent. Good opacification of the central and segmental pulmonary arteries. No evidence of pulmonary embolus. Mediastinum/Nodes: Esophagus is decompressed. No significant lymphadenopathy. Right-sided Infuse-A-Port with tip in the superior vena cava. Lungs/Pleura: Thick-walled cavitary lesion with spiculation in the left apex measuring 3.1 x 3.4 cm in diameter. The lesion is mildly enlarged since the previous study. Mild scarring or nodularity in the right apex. No consolidation or edema. Scattered emphysematous changes. Slight fibrosis along the anterior left chest wall may represent radiation change if there is a history. No pleural effusions. No pneumothorax. Musculoskeletal: Calcifications in the left breast. Normal alignment of the thoracic spine. Degenerative changes. No vertebral compression deformities. Poorly defined vague lucency in the T12 vertebral body with slight irregularity of the cortical surface and mild adjacent soft tissue swelling. This is likely bone metastasis. CT ABDOMEN PELVIS FINDINGS Hepatobiliary: Prominent cirrhotic changes throughout the liver with right lobe atrophy and enlarged left lobe and caudate lobe. Prominent nodular contour with fibrotic bands. Multiple peripherally enhancing lesions throughout the liver. Largest is in segment 4, measuring about 2.4 cm diameter but most are and the 1-2 cm diameter range. This is likely metastatic disease.  Progression since the previous study. Gallbladder and bile ducts are unremarkable. Pancreas: Unremarkable. No pancreatic ductal dilatation or surrounding inflammatory changes. Spleen: Normal in size without focal abnormality. Adrenals/Urinary Tract: Left adrenal gland nodule measuring 9 mm diameter, indeterminate. No significant change. Kidneys are symmetrical. Nephrograms are homogeneous. No hydronephrosis or hydroureter. Bladder is normal. Stomach/Bowel: The stomach, small bowel, and colon are not abnormally distended. Stool fills the colon. Appendix is not identified. Vascular/Lymphatic: Calcification of the aorta. No aneurysm. Inferior vena caval filter. No significant retroperitoneal lymphadenopathy. Reproductive: Calcified fibroids in the uterus. No abnormal adnexal masses. Other: No free air or free fluid in the abdomen. Abdominal wall musculature appears intact. Musculoskeletal: Sclerosis adjacent to the pedicle of L4 and multiple poorly defined lucent lesions in the pelvis bilaterally, most prominent in the right superior pubic ramus, likely metastatic. No vertebral compression deformities. Degenerative changes in the hips. IMPRESSION: 1. Thick-walled cavitary lesion in the left apex measuring 3.1 x 3.4 cm in diameter, mildly enlarged since the previous study. 2. Prominent cirrhotic changes throughout the liver. Multiple peripherally enhancing lesions throughout the liver, likely metastatic disease. Progression since the previous study. 3. Multiple poorly defined lucent lesions in the pelvis and T12 vertebral body,  likely metastatic. 4. No evidence of pulmonary embolus or aortic dissection. 5. 9 mm diameter left adrenal gland nodule, indeterminate. No significant change. 6. Calcified fibroids in the uterus. 7. Inferior vena caval filter. 8. Emphysema and aortic atherosclerosis. Aortic Atherosclerosis (ICD10-I70.0) and Emphysema (ICD10-J43.9). Electronically Signed   By: Lucienne Capers M.D.   On:  06/24/2020 23:01    Carcinoma of upper-outer quadrant of left breast in female, estrogen receptor positive [facility]) # 64 year old female patient with with widespread metastatic breast cancer with multiple medical problems is currently admitted to hospital for generalized weakness/left hip pain; incidentally noted to have elevated white count of 60,000.  # Recurrent metastatic breast cancer-lung/liver metastases ER PR positive HER-2 negative-most recently on gemcitabine.  June 24, 2020/inpatient CT scan chest and pelvis-progressive disease in the lung/bones.  See below  #Leukocytosis white count 60,000 predominant neutrophilia likely growth factor support versus underlying infection-clinically less likely..  WBC today 45,000.  # Gait instability- worse [Hx of LP disease/RT]-progressive getting worse again likely secondary to progressive malignancy.  Agree with physical therapy evaluation.  # cardiomyopathy/ Sinus tachycardia- on metoprolol-/Tamabcor; Luetta Nutting 2021- 2d-echo- 40-45%]  # Elevated LFTs/alkaline phosphatase 510-likely secondary to progressive malignancy.  # Peripheral neuropathy grade 1-2-  on Lyrica.   # Right lower extremity DVT bilateral [November 28, 2019] CT scan abdomen pelvis negative for any DVT.  On eliquis.  #Recommendations:  #Discussed with the patient given the progression of disease noted on current chemotherapy-I would recommend discontinuation of gemcitabine.  Patient understands that she is status post multiple lines of chemotherapy; and unfortunately response rates to further lines of therapy would be in the order of 10 to 20%.  Given her multiple comorbidities/declining performance status-high risk of side effects/death from chemotherapy itself.  However, wants to pursue any therapeutic options if available.  Recommend 2D echo to assess heart function- for doxil chemotherapy.    #Low back pain/left hip pain-recommend a bone scan for further evaluation-could consider  palliative radiation.  #Disposition: Discussed with the patient that she might end up needing rehab placement/nursing home placement given her unsteady gait/progressive malignancy/lack of social support [patient lives by herself].  Patient is reluctant to the idea of placement at this time.   Thank you Dr.Sreenath for allowing me to participate in the care of your pleasant patient. Please do not hesitate to contact me with questions or concerns in the interim.  Discussed with Dr. Priscella Mann. Will reach out to pt's brother later today.   # I reviewed the blood work- with the patient in detail; also reviewed the imaging independently [as summarized above]; and with the patient in detail.     All questions were answered. The patient knows to call the clinic with any problems, questions or concerns.    Cammie Sickle, MD 06/25/2020 5:37 PM

## 2020-06-26 ENCOUNTER — Telehealth: Payer: Self-pay | Admitting: Internal Medicine

## 2020-06-26 ENCOUNTER — Observation Stay: Payer: Medicare HMO

## 2020-06-26 ENCOUNTER — Encounter: Payer: Self-pay | Admitting: Family Medicine

## 2020-06-26 DIAGNOSIS — C50912 Malignant neoplasm of unspecified site of left female breast: Secondary | ICD-10-CM | POA: Diagnosis not present

## 2020-06-26 DIAGNOSIS — Z87898 Personal history of other specified conditions: Secondary | ICD-10-CM | POA: Diagnosis not present

## 2020-06-26 DIAGNOSIS — R531 Weakness: Secondary | ICD-10-CM | POA: Diagnosis not present

## 2020-06-26 DIAGNOSIS — Z170 Estrogen receptor positive status [ER+]: Secondary | ICD-10-CM | POA: Diagnosis not present

## 2020-06-26 DIAGNOSIS — Z86711 Personal history of pulmonary embolism: Secondary | ICD-10-CM | POA: Diagnosis not present

## 2020-06-26 DIAGNOSIS — M25552 Pain in left hip: Secondary | ICD-10-CM | POA: Diagnosis not present

## 2020-06-26 DIAGNOSIS — Z8679 Personal history of other diseases of the circulatory system: Secondary | ICD-10-CM | POA: Diagnosis not present

## 2020-06-26 DIAGNOSIS — D72829 Elevated white blood cell count, unspecified: Secondary | ICD-10-CM | POA: Diagnosis not present

## 2020-06-26 DIAGNOSIS — C50412 Malignant neoplasm of upper-outer quadrant of left female breast: Secondary | ICD-10-CM | POA: Diagnosis not present

## 2020-06-26 DIAGNOSIS — C7931 Secondary malignant neoplasm of brain: Secondary | ICD-10-CM | POA: Diagnosis not present

## 2020-06-26 LAB — CBC WITH DIFFERENTIAL/PLATELET
Abs Immature Granulocytes: 0.46 10*3/uL — ABNORMAL HIGH (ref 0.00–0.07)
Basophils Absolute: 0 10*3/uL (ref 0.0–0.1)
Basophils Relative: 0 %
Eosinophils Absolute: 0 10*3/uL (ref 0.0–0.5)
Eosinophils Relative: 0 %
HCT: 38.9 % (ref 36.0–46.0)
Hemoglobin: 13.7 g/dL (ref 12.0–15.0)
Immature Granulocytes: 1 %
Lymphocytes Relative: 2 %
Lymphs Abs: 0.9 10*3/uL (ref 0.7–4.0)
MCH: 33.6 pg (ref 26.0–34.0)
MCHC: 35.2 g/dL (ref 30.0–36.0)
MCV: 95.3 fL (ref 80.0–100.0)
Monocytes Absolute: 0.5 10*3/uL (ref 0.1–1.0)
Monocytes Relative: 1 %
Neutro Abs: 39.4 10*3/uL — ABNORMAL HIGH (ref 1.7–7.7)
Neutrophils Relative %: 96 %
Platelets: 178 10*3/uL (ref 150–400)
RBC: 4.08 MIL/uL (ref 3.87–5.11)
RDW: 16.4 % — ABNORMAL HIGH (ref 11.5–15.5)
WBC: 41.4 10*3/uL — ABNORMAL HIGH (ref 4.0–10.5)
nRBC: 0 % (ref 0.0–0.2)

## 2020-06-26 LAB — ECHOCARDIOGRAM COMPLETE
Area-P 1/2: 3.6 cm2
Height: 66 in
S' Lateral: 2.55 cm
Weight: 2240.01 oz

## 2020-06-26 LAB — COMPREHENSIVE METABOLIC PANEL
ALT: 60 U/L — ABNORMAL HIGH (ref 0–44)
AST: 69 U/L — ABNORMAL HIGH (ref 15–41)
Albumin: 3.7 g/dL (ref 3.5–5.0)
Alkaline Phosphatase: 505 U/L — ABNORMAL HIGH (ref 38–126)
Anion gap: 10 (ref 5–15)
BUN: 9 mg/dL (ref 8–23)
CO2: 31 mmol/L (ref 22–32)
Calcium: 9.4 mg/dL (ref 8.9–10.3)
Chloride: 97 mmol/L — ABNORMAL LOW (ref 98–111)
Creatinine, Ser: 0.71 mg/dL (ref 0.44–1.00)
GFR calc Af Amer: >60 mL/min (ref >60)
GFR calc non Af Amer: >60 mL/min (ref >60)
Glucose, Bld: 137 mg/dL — ABNORMAL HIGH (ref 70–99)
Potassium: 3.6 mmol/L (ref 3.5–5.1)
Sodium: 138 mmol/L (ref 135–145)
Total Bilirubin: 1.2 mg/dL (ref 0.3–1.2)
Total Protein: 7.1 g/dL (ref 6.5–8.1)

## 2020-06-26 LAB — LACTATE DEHYDROGENASE: LDH: 165 U/L (ref 98–192)

## 2020-06-26 MED ORDER — TECHNETIUM TC 99M MEDRONATE IV KIT
20.0000 | PACK | Freq: Once | INTRAVENOUS | Status: AC | PRN
  Administered 2020-06-26: 19.634 via INTRAVENOUS

## 2020-06-26 NOTE — NC FL2 (Signed)
[place] LEVEL OF CARE SCREENING TOOL     IDENTIFICATION  Patient Name: Sarah Horne Birthdate: Feb 02, 1956 Sex: female Admission Date (Current Location): 06/24/2020  [place] and [place] Number:  Engineering geologist and Address:  [facility], [address], [place], [place] [address]      Provider Number: [id number]  Attending Physician Name and Address:  Nolberto Hanlon, MD  Relative Name and Phone Number:       Current Level of Care: Hospital Recommended Level of Care: [place] Prior Approval Number:    Date Approved/Denied:   PASRR Number: [id number]  Discharge Plan: SNF    Current Diagnoses: Patient Active Problem List   Diagnosis Date Noted  . Leukocytosis 06/25/2020  . History of pulmonary embolus (PE) 06/25/2020  . History of supraventricular tachycardia 06/25/2020  . History of seizure 06/25/2020  . Weakness 06/24/2020  . Chemotherapy-induced fatigue 11/02/2019  . Convalescence following chemotherapy 11/02/2019  . Leukopenia due to antineoplastic chemotherapy ([place]) 11/02/2019  . Elevated serum creatinine 11/02/2019  . Acute deep vein thrombosis (DVT) of femoral vein of right lower extremity ([place]) 11/01/2019  . Accidental fall from chair, initial encounter 10/05/2019  . Metastatic breast cancer ([place]) 10/05/2019  . Sepsis ([place]) 10/05/2019  . Tachyarrhythmia 10/05/2019  . Combined forms of age-related cataract of both eyes 12/06/2018  . Nuclear sclerotic cataract of left eye 12/06/2018  . Brain metastasis ([facility] [facility]) 05/19/2017  . Blood in stool   . Polyp of sigmoid colon   . Benign neoplasm of transverse colon   . Benign neoplasm of ascending colon   . Lower GI bleed   . Weakness of right lower extremity 02/11/2017  . Orthostatic lightheadedness 01/14/2017  . Atrial tachycardia ([place]) 01/14/2017  . Hypokalemia 01/14/2017  . Pulmonary emboli ([place]) 01/12/2017  . Dehydration 12/25/2016  . Blood loss  anemia 12/15/2016  . Ulceration of colon   . Heartburn   . Duodenitis   . Gastritis without bleeding   . GI bleed   . Acute esophagitis   . Acute esophagogastric ulcer   . Rectal bleed   . Hypovolemic shock ([place])   . Ischemic leg 12/04/2016  . Chemotherapy induced neutropenia ([place]) 11/20/2016  . Palliative care by specialist   . Goals of care, counseling/discussion   . DNR (do not resuscitate) discussion   . Cerebral edema ([place]) 11/10/2016  . Encounter for monitoring cardiotoxic drug therapy 11/09/2016  . Elevated LFTs 10/20/2016  . Peripheral neuropathy due to chemotherapy ([place]) 03/13/2016  . Encounter for antineoplastic chemotherapy 03/13/2016  . HLD (hyperlipidemia) 01/29/2016  . Hyperglycemia, unspecified 01/29/2016  . Cardiomyopathy ([place]) 01/13/2016  . Hypertension 07/18/2015  . Carcinoma of upper-outer quadrant of left breast in female, estrogen receptor positive ([place]) 12/27/2014    Orientation RESPIRATION BLADDER Height & Weight     Self, Time, Situation, Place  Normal Continent Weight: 63.5 kg Height:  5\' 6"  (167.6 cm)  BEHAVIORAL SYMPTOMS/MOOD NEUROLOGICAL BOWEL NUTRITION STATUS      Continent Diet (Heart Healthy)  AMBULATORY STATUS COMMUNICATION OF NEEDS Skin   Limited Assist Verbally Normal                       Personal Care Assistance Level of Assistance  Dressing, Feeding, Bathing Bathing Assistance: Limited assistance Feeding assistance: Independent Dressing Assistance: Limited assistance     Functional Limitations Info             SPECIAL CARE FACTORS FREQUENCY  PT (By licensed PT), OT (By licensed OT)                    Contractures Contractures Info: Not present    Additional Factors Info  Code Status, Allergies Code Status Info: Full Allergies Info: No known allergies           Current Medications (06/26/2020):  This is the current hospital active medication list Current Facility-Administered Medications  Medication  Dose Route Frequency Provider Last Rate Last Admin  . apixaban (ELIQUIS) tablet 5 mg  5 mg Oral BID Tu, Ching T, DO   5 mg at 06/26/20 1013  . Chlorhexidine Gluconate Cloth 2 % PADS 6 each  6 each Topical Daily Sharion Settler, NP   6 each at 06/25/20 2334  . flecainide (TAMBOCOR) tablet 50 mg  50 mg Oral BID Tu, Ching T, DO   50 mg at 06/26/20 1014  . levETIRAcetam (KEPPRA) tablet 250 mg  250 mg Oral BID Tu, Ching T, DO   250 mg at 06/26/20 1013  . loratadine (CLARITIN) tablet 10 mg  10 mg Oral Daily Tu, Ching T, DO   10 mg at 06/26/20 1013  . magnesium oxide (MAG-OX) tablet 400 mg  400 mg Oral BID Tu, Ching T, DO   400 mg at 06/26/20 1013  . metoprolol tartrate (LOPRESSOR) tablet 25 mg  25 mg Oral BID Tu, Ching T, DO   25 mg at 06/25/20 2114  . montelukast (SINGULAIR) tablet 10 mg  10 mg Oral QHS Tu, Ching T, DO   10 mg at 06/25/20 2115  . pregabalin (LYRICA) capsule 100 mg  100 mg Oral BID Tu, Ching T, DO   100 mg at 06/26/20 1013  . Vitamin D (Ergocalciferol) (DRISDOL) capsule 50,000 Units  50,000 Units Oral Q7 days Tu, Ching T, DO   50,000 Units at 06/25/20 9892   Facility-Administered Medications Ordered in Other Encounters  Medication Dose Route Frequency Provider Last Rate Last Admin  . 0.9 %  sodium chloride infusion   Intravenous Continuous [name], MD   New Bag at 12/10/16 1140  . 0.9 %  sodium chloride infusion   Intravenous Continuous Lloyd Huger, MD 999 mL/hr at 04/24/15 1520 New Bag at 05/23/15 0951  . 0.9 %  sodium chloride infusion   Intravenous Continuous Verlon Au, NP 999 mL/hr at 10/31/19 1522 New Bag at 10/31/19 1522  . heparin lock flush 100 unit/mL  500 Units Intravenous Once Berenzon, Dmitriy, MD      . sodium chloride 0.9 % injection 10 mL  10 mL Intravenous PRN [name], MD   10 mL at 04/04/15 1440  . sodium chloride flush (NS) 0.9 % injection 10 mL  10 mL Intravenous PRN Roxana Hires, MD         Discharge Medications: Please see  discharge summary for a list of discharge medications.  Relevant Imaging Results:  Relevant Lab Results:   Additional Information SSN: [id number]  Shelbie Ammons, RN

## 2020-06-26 NOTE — Progress Notes (Signed)
Sarah Horne   DOB:March 31, 1956   LT#:[id number]    Subjective: no acute events over night. Continues to have gait instability. S/p evaluation with PT. no fevers.  Nontoxic.  Objective:  Vitals:   06/26/20 0743 06/26/20 1614  BP: 103/73 110/70  Pulse: 82 82  Resp: 17 17  Temp: 98.2 F (36.8 C) 98.4 F (36.9 C)  SpO2: 96% 99%     Intake/Output Summary (Last 24 hours) at 06/26/2020 1902 Last data filed at 06/26/2020 1855 Gross per 24 hour  Intake 240 ml  Output --  Net 240 ml    Physical Exam HENT:     Head: Normocephalic and atraumatic.     Mouth/Throat:     Pharynx: No oropharyngeal exudate.  Eyes:     Pupils: Pupils are equal, round, and reactive to light.  Cardiovascular:     Rate and Rhythm: Normal rate and regular rhythm.  Pulmonary:     Effort: No respiratory distress.     Breath sounds: No wheezing.     Comments: Decreased breath sounds at bases.  Abdominal:     General: Bowel sounds are normal. There is no distension.     Palpations: Abdomen is soft. There is no mass.     Tenderness: There is no abdominal tenderness. There is no guarding or rebound.  Musculoskeletal:        General: No tenderness. Normal range of motion.     Cervical back: Normal range of motion and neck supple.  Skin:    General: Skin is warm.  Neurological:     Mental Status: She is alert and oriented to person, place, and time.  Psychiatric:        Mood and Affect: Affect normal.      Labs:  Lab Results  Component Value Date   WBC 41.4 (H) 06/26/2020   HGB 13.7 06/26/2020   HCT 38.9 06/26/2020   MCV 95.3 06/26/2020   PLT 178 06/26/2020   NEUTROABS 39.4 (H) 06/26/2020    Lab Results  Component Value Date   NA 138 06/26/2020   K 3.6 06/26/2020   CL 97 (L) 06/26/2020   CO2 31 06/26/2020    Studies:  CT Chest W Contrast  Result Date: 06/24/2020 CLINICAL DATA:  Metastatic cancer with back pain for couple of months. Pain is worse with moving. Weakness. EXAM: CT CHEST,  ABDOMEN, AND PELVIS WITH CONTRAST TECHNIQUE: Multidetector CT imaging of the chest, abdomen and pelvis was performed following the standard protocol during bolus administration of intravenous contrast. CONTRAST:  175m OMNIPAQUE IOHEXOL 300 MG/ML  SOLN COMPARISON:  CT chest abdomen and pelvis 03/26/2020 FINDINGS: CT CHEST FINDINGS Cardiovascular: Mild cardiac enlargement. No pericardial effusions. Normal caliber thoracic aorta. No aortic dissection. Great vessel origins are patent. Good opacification of the central and segmental pulmonary arteries. No evidence of pulmonary embolus. Mediastinum/Nodes: Esophagus is decompressed. No significant lymphadenopathy. Right-sided Infuse-A-Port with tip in the superior vena cava. Lungs/Pleura: Thick-walled cavitary lesion with spiculation in the left apex measuring 3.1 x 3.4 cm in diameter. The lesion is mildly enlarged since the previous study. Mild scarring or nodularity in the right apex. No consolidation or edema. Scattered emphysematous changes. Slight fibrosis along the anterior left chest wall may represent radiation change if there is a history. No pleural effusions. No pneumothorax. Musculoskeletal: Calcifications in the left breast. Normal alignment of the thoracic spine. Degenerative changes. No vertebral compression deformities. Poorly defined vague lucency in the T12 vertebral body with slight irregularity of the cortical  surface and mild adjacent soft tissue swelling. This is likely bone metastasis. CT ABDOMEN PELVIS FINDINGS Hepatobiliary: Prominent cirrhotic changes throughout the liver with right lobe atrophy and enlarged left lobe and caudate lobe. Prominent nodular contour with fibrotic bands. Multiple peripherally enhancing lesions throughout the liver. Largest is in segment 4, measuring about 2.4 cm diameter but most are and the 1-2 cm diameter range. This is likely metastatic disease. Progression since the previous study. Gallbladder and bile ducts are  unremarkable. Pancreas: Unremarkable. No pancreatic ductal dilatation or surrounding inflammatory changes. Spleen: Normal in size without focal abnormality. Adrenals/Urinary Tract: Left adrenal gland nodule measuring 9 mm diameter, indeterminate. No significant change. Kidneys are symmetrical. Nephrograms are homogeneous. No hydronephrosis or hydroureter. Bladder is normal. Stomach/Bowel: The stomach, small bowel, and colon are not abnormally distended. Stool fills the colon. Appendix is not identified. Vascular/Lymphatic: Calcification of the aorta. No aneurysm. Inferior vena caval filter. No significant retroperitoneal lymphadenopathy. Reproductive: Calcified fibroids in the uterus. No abnormal adnexal masses. Other: No free air or free fluid in the abdomen. Abdominal wall musculature appears intact. Musculoskeletal: Sclerosis adjacent to the pedicle of L4 and multiple poorly defined lucent lesions in the pelvis bilaterally, most prominent in the right superior pubic ramus, likely metastatic. No vertebral compression deformities. Degenerative changes in the hips. IMPRESSION: 1. Thick-walled cavitary lesion in the left apex measuring 3.1 x 3.4 cm in diameter, mildly enlarged since the previous study. 2. Prominent cirrhotic changes throughout the liver. Multiple peripherally enhancing lesions throughout the liver, likely metastatic disease. Progression since the previous study. 3. Multiple poorly defined lucent lesions in the pelvis and T12 vertebral body, likely metastatic. 4. No evidence of pulmonary embolus or aortic dissection. 5. 9 mm diameter left adrenal gland nodule, indeterminate. No significant change. 6. Calcified fibroids in the uterus. 7. Inferior vena caval filter. 8. Emphysema and aortic atherosclerosis. Aortic Atherosclerosis (ICD10-I70.0) and Emphysema (ICD10-J43.9). Electronically Signed   By: Lucienne Capers M.D.   On: 06/24/2020 23:01   NM Bone Scan Whole Body  Result Date:  06/26/2020 CLINICAL DATA:  LEFT breast cancer, assess response to ongoing treatment, recent onset of LEFT hip pain, no known injury EXAM: NUCLEAR MEDICINE WHOLE BODY BONE SCAN TECHNIQUE: Whole body anterior and posterior images were obtained approximately 3 hours after intravenous injection of radiopharmaceutical. RADIOPHARMACEUTICALS:  19.63 mCi Technetium-80mMDP IV COMPARISON:  None Radiographic correlation: CT chest abdomen pelvis 06/24/2020 FINDINGS: Multiple sites of abnormal osseous tracer uptake are seen consistent with osseous metastatic disease. These include BILATERAL ribs, RIGHT clavicle, thoracic and lumbar vertebra, pelvis, and proximal RIGHT femoral diaphysis. Additional uptake identified at RIGHT lateral femoral condyle, concerning for additional distal femoral metaphysis. Questionable uptake distal LEFT humeral diaphysis. Expected urinary tract and soft tissue distribution of tracer. IMPRESSION: Multiple sites of abnormal tracer accumulation as above consistent with osseous metastases. These include proximal RIGHT femoral diaphysis, RIGHT lateral femoral condyle, and questionably distal LEFT humeral diaphysis; consider radiographic assessment. Electronically Signed   By: MLavonia DanaM.D.   On: 06/26/2020 12:46   CT ABDOMEN PELVIS W CONTRAST  Result Date: 06/24/2020 CLINICAL DATA:  Metastatic cancer with back pain for couple of months. Pain is worse with moving. Weakness. EXAM: CT CHEST, ABDOMEN, AND PELVIS WITH CONTRAST TECHNIQUE: Multidetector CT imaging of the chest, abdomen and pelvis was performed following the standard protocol during bolus administration of intravenous contrast. CONTRAST:  1054mOMNIPAQUE IOHEXOL 300 MG/ML  SOLN COMPARISON:  CT chest abdomen and pelvis 03/26/2020 FINDINGS: CT CHEST FINDINGS Cardiovascular: Mild  cardiac enlargement. No pericardial effusions. Normal caliber thoracic aorta. No aortic dissection. Great vessel origins are patent. Good opacification of the  central and segmental pulmonary arteries. No evidence of pulmonary embolus. Mediastinum/Nodes: Esophagus is decompressed. No significant lymphadenopathy. Right-sided Infuse-A-Port with tip in the superior vena cava. Lungs/Pleura: Thick-walled cavitary lesion with spiculation in the left apex measuring 3.1 x 3.4 cm in diameter. The lesion is mildly enlarged since the previous study. Mild scarring or nodularity in the right apex. No consolidation or edema. Scattered emphysematous changes. Slight fibrosis along the anterior left chest wall may represent radiation change if there is a history. No pleural effusions. No pneumothorax. Musculoskeletal: Calcifications in the left breast. Normal alignment of the thoracic spine. Degenerative changes. No vertebral compression deformities. Poorly defined vague lucency in the T12 vertebral body with slight irregularity of the cortical surface and mild adjacent soft tissue swelling. This is likely bone metastasis. CT ABDOMEN PELVIS FINDINGS Hepatobiliary: Prominent cirrhotic changes throughout the liver with right lobe atrophy and enlarged left lobe and caudate lobe. Prominent nodular contour with fibrotic bands. Multiple peripherally enhancing lesions throughout the liver. Largest is in segment 4, measuring about 2.4 cm diameter but most are and the 1-2 cm diameter range. This is likely metastatic disease. Progression since the previous study. Gallbladder and bile ducts are unremarkable. Pancreas: Unremarkable. No pancreatic ductal dilatation or surrounding inflammatory changes. Spleen: Normal in size without focal abnormality. Adrenals/Urinary Tract: Left adrenal gland nodule measuring 9 mm diameter, indeterminate. No significant change. Kidneys are symmetrical. Nephrograms are homogeneous. No hydronephrosis or hydroureter. Bladder is normal. Stomach/Bowel: The stomach, small bowel, and colon are not abnormally distended. Stool fills the colon. Appendix is not identified.  Vascular/Lymphatic: Calcification of the aorta. No aneurysm. Inferior vena caval filter. No significant retroperitoneal lymphadenopathy. Reproductive: Calcified fibroids in the uterus. No abnormal adnexal masses. Other: No free air or free fluid in the abdomen. Abdominal wall musculature appears intact. Musculoskeletal: Sclerosis adjacent to the pedicle of L4 and multiple poorly defined lucent lesions in the pelvis bilaterally, most prominent in the right superior pubic ramus, likely metastatic. No vertebral compression deformities. Degenerative changes in the hips. IMPRESSION: 1. Thick-walled cavitary lesion in the left apex measuring 3.1 x 3.4 cm in diameter, mildly enlarged since the previous study. 2. Prominent cirrhotic changes throughout the liver. Multiple peripherally enhancing lesions throughout the liver, likely metastatic disease. Progression since the previous study. 3. Multiple poorly defined lucent lesions in the pelvis and T12 vertebral body, likely metastatic. 4. No evidence of pulmonary embolus or aortic dissection. 5. 9 mm diameter left adrenal gland nodule, indeterminate. No significant change. 6. Calcified fibroids in the uterus. 7. Inferior vena caval filter. 8. Emphysema and aortic atherosclerosis. Aortic Atherosclerosis (ICD10-I70.0) and Emphysema (ICD10-J43.9). Electronically Signed   By: Lucienne Capers M.D.   On: 06/24/2020 23:01   ECHOCARDIOGRAM COMPLETE  Result Date: 06/26/2020    ECHOCARDIOGRAM REPORT   Patient Name:   Sarah Horne Date of Exam: 06/25/2020 Medical Rec #:  [id number]       Height:       66.0 in Accession #:    [id number]      Weight:       140.0 lb Date of Birth:  1956/06/26       BSA:          1.719 m Patient Age:    64 years        BP:           114/67 mmHg Patient  Gender: F               HR:           66 bpm. Exam Location:  [facility] Procedure: 2D Echo Indications:     Cardiomyopathy-Unspecified 425.9 / I42.9  History:         Patient has prior history of  Echocardiogram examinations, most                  recent 11/29/2019. Risk Factors:Hypertension and Dyslipidemia.  Sonographer:     Avanell Shackleton Referring Phys:  [id number] Cammie Sickle Diagnosing Phys: Nelva Bush MD IMPRESSIONS  1. Left ventricular ejection fraction, by estimation, is 50 to 55%. The left ventricle has low normal function. The left ventricle has no regional wall motion abnormalities. Left ventricular diastolic parameters were normal.  2. Right ventricular systolic function is low normal. The right ventricular size is normal. There is normal pulmonary artery systolic pressure.  3. The mitral valve is normal in structure. Trivial mitral valve regurgitation. No evidence of mitral stenosis.  4. The aortic valve is tricuspid. There is mild thickening of the aortic valve. Aortic valve regurgitation is not visualized. No aortic stenosis is present.  5. The inferior vena cava is normal in size with greater than 50% respiratory variability, suggesting right atrial pressure of 3 mmHg. FINDINGS  Left Ventricle: Left ventricular ejection fraction, by estimation, is 50 to 55%. The left ventricle has low normal function. The left ventricle has no regional wall motion abnormalities. The left ventricular internal cavity size was normal in size. There is no left ventricular hypertrophy. Left ventricular diastolic parameters were normal. Right Ventricle: The right ventricular size is normal. No increase in right ventricular wall thickness. Right ventricular systolic function is low normal. There is normal pulmonary artery systolic pressure. The tricuspid regurgitant velocity is 2.73 m/s,  and with an assumed right atrial pressure of 3 mmHg, the estimated right ventricular systolic pressure is 35.5 mmHg. Left Atrium: Left atrial size was normal in size. Right Atrium: Right atrial size was normal in size. Pericardium: The pericardium was not well visualized. Mitral Valve: The mitral valve is normal in  structure. Trivial mitral valve regurgitation. No evidence of mitral valve stenosis. Tricuspid Valve: The tricuspid valve is normal in structure. Tricuspid valve regurgitation is mild. Aortic Valve: The aortic valve is tricuspid. There is mild thickening of the aortic valve. Aortic valve regurgitation is not visualized. No aortic stenosis is present. Pulmonic Valve: The pulmonic valve was not well visualized. Pulmonic valve regurgitation is not visualized. No evidence of pulmonic stenosis. Aorta: The aortic root is normal in size and structure. Pulmonary Artery: The pulmonary artery is not well seen. Venous: The inferior vena cava is normal in size with greater than 50% respiratory variability, suggesting right atrial pressure of 3 mmHg. IAS/Shunts: The interatrial septum was not well visualized.  LEFT VENTRICLE PLAX 2D LVIDd:         3.98 cm  Diastology LVIDs:         2.55 cm  LV e' medial:    8.06 cm/s LV PW:         0.92 cm  LV E/e' medial:  7.6 LV IVS:        0.72 cm  LV e' lateral:   9.90 cm/s LVOT diam:     2.00 cm  LV E/e' lateral: 6.2 LVOT Area:     3.14 cm  RIGHT VENTRICLE  IVC RV S prime:     9.90 cm/s  IVC diam: 0.87 cm TAPSE (M-mode): 2.3 cm LEFT ATRIUM             Index       RIGHT ATRIUM           Index LA diam:        2.70 cm 1.57 cm/m  RA Area:     15.70 cm LA Vol (A2C):   43.1 ml 25.08 ml/m RA Volume:   37.60 ml  21.88 ml/m LA Vol (A4C):   24.3 ml 14.14 ml/m LA Biplane Vol: 33.2 ml 19.32 ml/m   AORTA Ao Root diam: 3.20 cm MITRAL VALVE               TRICUSPID VALVE MV Area (PHT): 3.60 cm    TR Peak grad:   29.8 mmHg MV Decel Time: 211 msec    TR Vmax:        273.00 cm/s MV E velocity: 61.10 cm/s MV A velocity: 67.40 cm/s  SHUNTS MV E/A ratio:  0.91        Systemic Diam: 2.00 cm Harrell Gave End MD Electronically signed by Nelva Bush MD Signature Date/Time: 06/26/2020/6:55:51 AM    Final     Carcinoma of upper-outer quadrant of left breast in female, estrogen receptor positive  [facility]) # 64 year old female patient with with widespread metastatic breast cancer with multiple medical problems is currently admitted to hospital for generalized weakness/left hip pain; incidentally noted to have elevated white count of 60,000.  # Recurrent metastatic breast cancer-lung/liver metastases ER PR positive HER-2 negative-most recently on gemcitabine.  June 24, 2020/inpatient CT scan chest and pelvis-progressive disease in the lung/bones.;  Awaiting on bone scan.    #Leukocytosis white count 60,000 predominant neutrophilia likely growth factor support [clinically less likely of infectious etiology]-slowly trending down.  # Gait instability- worse [Hx of LP disease/RT]-progressive getting worse again likely secondary to progressive malignancy; s/p physical therapy evaluation.  # cardiomyopathy/ Sinus tachycardia- on metoprolol-/Tamabcor; Luetta Nutting 2021- 2d-echo- 40-45%]-2D echo-September 2021-50 to 55% ejection fraction.   # Elevated LFTs/alkaline phosphatase 510-likely secondary to progressive malignancy.  # Peripheral neuropathy grade 1-2-  on Lyrica.   # Right lower extremity DVT bilateral [November 28, 2019] CT scan abdomen pelvis negative for any DVT.  On eliquis.  #Low back pain/left hip pain-await bone scan to consider radiation.  #Disposition: Patient unfortunately has poor social support; lives alone.  Recommend placement at skilled nursing facility.  Discussed with Dr.Amery.  Await physical therapy evaluation.    Cammie Sickle, MD 06/26/2020  7:02 PM

## 2020-06-26 NOTE — Progress Notes (Signed)
Physical Therapy Treatment Patient Details Name: Sarah Horne MRN: [id number] DOB: [birth year] Today's Date: 06/26/2020    History of Present Illness 64 y.o. female with medical history significant for metastatic breast cancer/leptomeningeal disease metastatic to brain on gemcitabine, hx of seizure, SVT, cardiomyopathy, PE/DVT on Eliquis, history of GI bleed and hypertension who presents with concerns of weakness    PT Comments    Pt resting in bed upon PT arrival and requesting to toilet so therapist assisted pt to bathroom.  Supervision with bed mobility; mod assist to stand from bed up to RW (pt tried to stand on her own but was not succesful even with max cueing; significant posterior lean noted upon standing and pt stabilizing B LE's against bed requiring assist for balance); CGA with ambulation 10 feet x2 (to bathroom and back) with RW; and min assist for transfer from [facility] (over toilet) with use of grab bar and walker.  Will continue to focus on strengthening and progressive functional mobility per pt tolerance.  Per care management pt lives alone and does not have 24/7 assist.  Currently pt does not appear safe to discharge home alone d/t assist levels required for safe functional mobility.  PT recommendations updated to STR: MD and care management notified.   Follow Up Recommendations  SNF     Equipment Recommendations  Rolling walker with 5" wheels;3in1 (PT)    Recommendations for Other Services       Precautions / Restrictions Precautions Precautions: Fall Precaution Comments: Metastatic disease Restrictions Weight Bearing Restrictions: No    Mobility  Bed Mobility Overal bed mobility: Needs Assistance Bed Mobility: Supine to Sit;Sit to Supine     Supine to sit: Supervision;HOB elevated Sit to supine: Supervision;HOB elevated      Transfers Overall transfer level: Needs assistance Equipment used: Rolling walker (2 wheeled) Transfers: Sit to/from Stand Sit to  Stand: Mod assist;Min assist         General transfer comment: mod assist to stand from bed (vc's to scoot forward towards edge of bed and vc's for UE/LE placement and shifting weight forward to stand; B LE's stabilizing against bed to maintain static standing); min assist to stand from [facility] over toilet up to RW (min assist to steady d/t pt pushing B LE's against back of BSC causing BSC to move and pt became unsteady)  Ambulation/Gait Ambulation/Gait assistance: Min guard Gait Distance (Feet):  (10 feet x2) Assistive device: Rolling walker (2 wheeled)   Gait velocity: decreased   General Gait Details: decreased B LE step length; mild decreased stance time R LE (and decreased L LE step length compared to R LE); mildly shaky with ambulation   Stairs             Wheelchair Mobility    Modified Rankin (Stroke Patients Only)       Balance Overall balance assessment: Needs assistance Sitting-balance support: No upper extremity supported;Feet supported Sitting balance-Leahy Scale: Good Sitting balance - Comments: steady sitting reaching within BOS   Standing balance support: Single extremity supported Standing balance-Leahy Scale: Fair Standing balance comment: pt requiring at least single UE support for balance washing hands at sink; pt requiring mod assist for initiate standing balance from bed (up to RW) d/t posterior lean upon standing (pt stabilizing B LE's against bed)                            Cognition Arousal/Alertness: Awake/alert Behavior During Therapy: Impulsive Overall  Cognitive Status: No family/caregiver present to determine baseline cognitive functioning                                 General Comments: Oriented to person, place, time (pt looked up info on phone to verify), and general situation      Exercises      General Comments  Pt agreeable to PT session.      Pertinent Vitals/Pain Pain Assessment: 0-10 Pain Score:  0-No pain Pain Intervention(s): Limited activity within patient's tolerance;Monitored during session;Repositioned  Vitals (HR and O2 on room air) stable and WFL throughout treatment session.    Home Living                      Prior Function            PT Goals (current goals can now be found in the care plan section) Acute Rehab PT Goals Patient Stated Goal: go home PT Goal Formulation: With patient Time For Goal Achievement: 07/09/20 Potential to Achieve Goals: Good Progress towards PT goals: Progressing toward goals    Frequency    Min 2X/week      PT Plan Discharge plan needs to be updated    Co-evaluation              AM-PAC PT "6 Clicks" Mobility   Outcome Measure  Help needed turning from your back to your side while in a flat bed without using bedrails?: A Little Help needed moving from lying on your back to sitting on the side of a flat bed without using bedrails?: A Little Help needed moving to and from a bed to a chair (including a wheelchair)?: A Little Help needed standing up from a chair using your arms (e.g., wheelchair or bedside chair)?: A Lot Help needed to walk in hospital room?: A Little Help needed climbing 3-5 steps with a railing? : A Lot 6 Click Score: 16    End of Session Equipment Utilized During Treatment: Gait belt Activity Tolerance: Patient tolerated treatment well Patient left: in bed;with call bell/phone within reach;with bed alarm set Nurse Communication: Mobility status;Precautions (NT notified of pt's mobility and purewick removed) PT Visit Diagnosis: Unsteadiness on feet (R26.81);Muscle weakness (generalized) (M62.81);Difficulty in walking, not elsewhere classified (R26.2);Other abnormalities of gait and mobility (R26.89);History of falling (Z91.81)     Time: 3716-9678 PT Time Calculation (min) (ACUTE ONLY): 38 min  Charges:  $Gait Training: 8-22 mins $Therapeutic Activity: 23-37 mins                     Leitha Bleak, PT 06/26/20, 3:44 PM

## 2020-06-26 NOTE — Telephone Encounter (Signed)
On 9/28-spoke to patient's brother Marlou Sa at length regarding patient's overall poor prognosis/progression of disease/limited chemotherapy options.  He understands if patient needs placement-at a skilled nursing facility.  He understand that she is certainly high fall risk/which could be catastrophic as she is on blood thinners.   GB

## 2020-06-26 NOTE — TOC Initial Note (Signed)
Transition of Care [facility]) - Initial/Assessment Note    Patient Details  Name: Sarah Horne MRN: [id number] Date of Birth: June 12, 1956  Transition of Care [facility]) CM/SW Contact:    Shelbie Ammons, RN Phone Number: 06/26/2020, 11:18 AM  Clinical Narrative:    RNCM met with patient at bedside. Patient reports to feeling somewhat better today. Discussed discharge planning and patient reports that she would like to go home. She states that her brother lives next door and he is able to help her when needed along with picking things up for her. Discussed home health services and patient is agreeable, she is unsure of who she has had in the past and would just prefer whoever will accept her insurance. She denies need for any further equipment in the home.  RNCM reached out to [place] with [facility] and she will accept referral.                Expected Discharge Plan: [place] Barriers to Discharge: No Barriers Identified   Patient Goals and CMS Choice        Expected Discharge Plan and Services Expected Discharge Plan: [place] arrangements for the past 2 months: Single Family Home                           HH Arranged: RN, PT, OT, Nurse's Aide, Social Work [facility] Agency: [facility] Date [place] Agency Contacted: 06/26/20 Time HH Agency Contacted: 1117 Representative spoke with at [place]: [place] Arrangements/Services Living arrangements for the past 2 months: [place] with:: Self Patient language and need for interpreter reviewed:: Yes Do you feel safe going back to the place where you live?: Yes      Need for Family Participation in Patient Care: Yes (Comment) Care giver support system in place?: Yes (comment)   Criminal Activity/Legal Involvement Pertinent to Current Situation/Hospitalization: No - Comment as needed  Activities of Daily Living Home Assistive  Devices/Equipment: Bedside commode/3-in-1, Walker (specify type) (rollator) ADL Screening (condition at time of admission) Patient's cognitive ability adequate to safely complete daily activities?: Yes Is the patient deaf or have difficulty hearing?: No Does the patient have difficulty seeing, even when wearing glasses/contacts?: Yes (cataracts in both eyes) Does the patient have difficulty concentrating, remembering, or making decisions?: No Patient able to express need for assistance with ADLs?: Yes Does the patient have difficulty dressing or bathing?: No Independently performs ADLs?: Yes (appropriate for developmental age) Does the patient have difficulty walking or climbing stairs?: No Weakness of Legs: Both Weakness of Arms/Hands: None (neuropathy bilateral hands and feet)  Permission Sought/Granted                  Emotional Assessment Appearance:: Appears stated age Attitude/Demeanor/Rapport: Engaged Affect (typically observed): Appropriate, Calm Orientation: : Oriented to Self, Oriented to Place, Oriented to  Time, Oriented to Situation Alcohol / Substance Use: Not Applicable Psych Involvement: No (comment)  Admission diagnosis:  Weakness [R53.1] Leukocytosis, unspecified type [D72.829] Patient Active Problem List   Diagnosis Date Noted  . Leukocytosis 06/25/2020  . History of pulmonary embolus (PE) 06/25/2020  . History of supraventricular tachycardia 06/25/2020  . History of seizure 06/25/2020  . Weakness 06/24/2020  . Chemotherapy-induced fatigue 11/02/2019  . Convalescence following chemotherapy 11/02/2019  . Leukopenia due to antineoplastic chemotherapy ([place]) 11/02/2019  .  Elevated serum creatinine 11/02/2019  . Acute deep vein thrombosis (DVT) of femoral vein of right lower extremity ([place]) 11/01/2019  . Accidental fall from chair, initial encounter 10/05/2019  . Metastatic breast cancer ([place]) 10/05/2019  . Sepsis ([place]) 10/05/2019  . Tachyarrhythmia  10/05/2019  . Combined forms of age-related cataract of both eyes 12/06/2018  . Nuclear sclerotic cataract of left eye 12/06/2018  . Brain metastasis ([place]) 05/19/2017  . Blood in stool   . Polyp of sigmoid colon   . Benign neoplasm of transverse colon   . Benign neoplasm of ascending colon   . Lower GI bleed   . Weakness of right lower extremity 02/11/2017  . Orthostatic lightheadedness 01/14/2017  . Atrial tachycardia ([place]) 01/14/2017  . Hypokalemia 01/14/2017  . Pulmonary emboli ([place]) 01/12/2017  . Dehydration 12/25/2016  . Blood loss anemia 12/15/2016  . Ulceration of colon   . Heartburn   . Duodenitis   . Gastritis without bleeding   . GI bleed   . Acute esophagitis   . Acute esophagogastric ulcer   . Rectal bleed   . Hypovolemic shock ([place])   . Ischemic leg 12/04/2016  . Chemotherapy induced neutropenia ([place]) 11/20/2016  . Palliative care by specialist   . Goals of care, counseling/discussion   . DNR (do not resuscitate) discussion   . Cerebral edema ([place]) 11/10/2016  . Encounter for monitoring cardiotoxic drug therapy 11/09/2016  . Elevated LFTs 10/20/2016  . Peripheral neuropathy due to chemotherapy ([place]) 03/13/2016  . Encounter for antineoplastic chemotherapy 03/13/2016  . HLD (hyperlipidemia) 01/29/2016  . Hyperglycemia, unspecified 01/29/2016  . Cardiomyopathy ([place]) 01/13/2016  . Hypertension 07/18/2015  . Carcinoma of upper-outer quadrant of left breast in female, estrogen receptor positive ([place]) 12/27/2014   PCP:  Delsa Grana, PA-C Pharmacy:   [id number] [place], [place] [place] [place]Phone: [phone]: [phone] [facility] - [address]Phone: [phone]: [phone] [facility] [address] [place]Phone:  [phone]: [address] [place]Phone: [phone]: [phone]    Social Determinants of Health (SDOH) Interventions    Readmission Risk Interventions No flowsheet data found.

## 2020-06-27 ENCOUNTER — Other Ambulatory Visit: Payer: Self-pay | Admitting: Internal Medicine

## 2020-06-27 DIAGNOSIS — I471 Supraventricular tachycardia: Secondary | ICD-10-CM | POA: Diagnosis not present

## 2020-06-27 DIAGNOSIS — Z515 Encounter for palliative care: Secondary | ICD-10-CM | POA: Diagnosis not present

## 2020-06-27 DIAGNOSIS — R279 Unspecified lack of coordination: Secondary | ICD-10-CM | POA: Diagnosis not present

## 2020-06-27 DIAGNOSIS — D72829 Elevated white blood cell count, unspecified: Secondary | ICD-10-CM | POA: Diagnosis not present

## 2020-06-27 DIAGNOSIS — Z5189 Encounter for other specified aftercare: Secondary | ICD-10-CM | POA: Diagnosis not present

## 2020-06-27 DIAGNOSIS — M25571 Pain in right ankle and joints of right foot: Secondary | ICD-10-CM | POA: Diagnosis not present

## 2020-06-27 DIAGNOSIS — Z7901 Long term (current) use of anticoagulants: Secondary | ICD-10-CM | POA: Diagnosis not present

## 2020-06-27 DIAGNOSIS — C50919 Malignant neoplasm of unspecified site of unspecified female breast: Secondary | ICD-10-CM | POA: Diagnosis not present

## 2020-06-27 DIAGNOSIS — R5381 Other malaise: Secondary | ICD-10-CM | POA: Diagnosis not present

## 2020-06-27 DIAGNOSIS — G629 Polyneuropathy, unspecified: Secondary | ICD-10-CM | POA: Diagnosis not present

## 2020-06-27 DIAGNOSIS — Z79899 Other long term (current) drug therapy: Secondary | ICD-10-CM | POA: Diagnosis not present

## 2020-06-27 DIAGNOSIS — E785 Hyperlipidemia, unspecified: Secondary | ICD-10-CM | POA: Diagnosis not present

## 2020-06-27 DIAGNOSIS — Z86718 Personal history of other venous thrombosis and embolism: Secondary | ICD-10-CM | POA: Diagnosis not present

## 2020-06-27 DIAGNOSIS — Z808 Family history of malignant neoplasm of other organs or systems: Secondary | ICD-10-CM | POA: Diagnosis not present

## 2020-06-27 DIAGNOSIS — Z7902 Long term (current) use of antithrombotics/antiplatelets: Secondary | ICD-10-CM | POA: Diagnosis not present

## 2020-06-27 DIAGNOSIS — C50412 Malignant neoplasm of upper-outer quadrant of left female breast: Secondary | ICD-10-CM | POA: Diagnosis not present

## 2020-06-27 DIAGNOSIS — Z170 Estrogen receptor positive status [ER+]: Secondary | ICD-10-CM | POA: Diagnosis not present

## 2020-06-27 DIAGNOSIS — Z86711 Personal history of pulmonary embolism: Secondary | ICD-10-CM | POA: Diagnosis not present

## 2020-06-27 DIAGNOSIS — Z95828 Presence of other vascular implants and grafts: Secondary | ICD-10-CM | POA: Diagnosis not present

## 2020-06-27 DIAGNOSIS — D701 Agranulocytosis secondary to cancer chemotherapy: Secondary | ICD-10-CM | POA: Diagnosis not present

## 2020-06-27 DIAGNOSIS — R7989 Other specified abnormal findings of blood chemistry: Secondary | ICD-10-CM | POA: Diagnosis not present

## 2020-06-27 DIAGNOSIS — C7931 Secondary malignant neoplasm of brain: Secondary | ICD-10-CM | POA: Diagnosis not present

## 2020-06-27 DIAGNOSIS — Z7722 Contact with and (suspected) exposure to environmental tobacco smoke (acute) (chronic): Secondary | ICD-10-CM | POA: Diagnosis not present

## 2020-06-27 DIAGNOSIS — R531 Weakness: Secondary | ICD-10-CM | POA: Diagnosis not present

## 2020-06-27 DIAGNOSIS — G40909 Epilepsy, unspecified, not intractable, without status epilepticus: Secondary | ICD-10-CM | POA: Diagnosis not present

## 2020-06-27 DIAGNOSIS — Z833 Family history of diabetes mellitus: Secondary | ICD-10-CM | POA: Diagnosis not present

## 2020-06-27 DIAGNOSIS — Z5111 Encounter for antineoplastic chemotherapy: Secondary | ICD-10-CM | POA: Diagnosis not present

## 2020-06-27 DIAGNOSIS — R634 Abnormal weight loss: Secondary | ICD-10-CM | POA: Diagnosis not present

## 2020-06-27 DIAGNOSIS — M6281 Muscle weakness (generalized): Secondary | ICD-10-CM | POA: Diagnosis not present

## 2020-06-27 DIAGNOSIS — C7800 Secondary malignant neoplasm of unspecified lung: Secondary | ICD-10-CM | POA: Diagnosis not present

## 2020-06-27 DIAGNOSIS — R2681 Unsteadiness on feet: Secondary | ICD-10-CM | POA: Diagnosis not present

## 2020-06-27 DIAGNOSIS — J439 Emphysema, unspecified: Secondary | ICD-10-CM | POA: Diagnosis not present

## 2020-06-27 DIAGNOSIS — Z8249 Family history of ischemic heart disease and other diseases of the circulatory system: Secondary | ICD-10-CM | POA: Diagnosis not present

## 2020-06-27 DIAGNOSIS — I252 Old myocardial infarction: Secondary | ICD-10-CM | POA: Diagnosis not present

## 2020-06-27 DIAGNOSIS — C7951 Secondary malignant neoplasm of bone: Secondary | ICD-10-CM | POA: Diagnosis not present

## 2020-06-27 DIAGNOSIS — C787 Secondary malignant neoplasm of liver and intrahepatic bile duct: Secondary | ICD-10-CM | POA: Diagnosis not present

## 2020-06-27 DIAGNOSIS — R2241 Localized swelling, mass and lump, right lower limb: Secondary | ICD-10-CM | POA: Diagnosis not present

## 2020-06-27 DIAGNOSIS — R2689 Other abnormalities of gait and mobility: Secondary | ICD-10-CM | POA: Diagnosis not present

## 2020-06-27 DIAGNOSIS — Z8679 Personal history of other diseases of the circulatory system: Secondary | ICD-10-CM | POA: Diagnosis not present

## 2020-06-27 DIAGNOSIS — I10 Essential (primary) hypertension: Secondary | ICD-10-CM | POA: Diagnosis not present

## 2020-06-27 DIAGNOSIS — Z87898 Personal history of other specified conditions: Secondary | ICD-10-CM | POA: Diagnosis not present

## 2020-06-27 DIAGNOSIS — I429 Cardiomyopathy, unspecified: Secondary | ICD-10-CM | POA: Diagnosis not present

## 2020-06-27 DIAGNOSIS — Z803 Family history of malignant neoplasm of breast: Secondary | ICD-10-CM | POA: Diagnosis not present

## 2020-06-27 DIAGNOSIS — F17210 Nicotine dependence, cigarettes, uncomplicated: Secondary | ICD-10-CM | POA: Diagnosis not present

## 2020-06-27 DIAGNOSIS — Z20822 Contact with and (suspected) exposure to covid-19: Secondary | ICD-10-CM | POA: Diagnosis not present

## 2020-06-27 LAB — CANCER ANTIGEN 15-3: CA 15-3: 69.1 U/mL — ABNORMAL HIGH (ref 0.0–25.0)

## 2020-06-27 LAB — CANCER ANTIGEN 27.29: CA 27.29: 74.2 U/mL — ABNORMAL HIGH (ref 0.0–38.6)

## 2020-06-27 LAB — CEA: CEA: 3.9 ng/mL (ref 0.0–4.7)

## 2020-06-27 NOTE — Progress Notes (Signed)
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  She is currently on gemcitabine.  Primary oncologist is Dr. Rogue Bussing.  Patient presented to the emergency department due to acute worsening of her progressive weakness. C/o of LE weakness    Consultants:   oncology  Procedures:  CT  1. Thick-walled cavitary lesion in the left apex measuring 3.1 x 3.4 cm in diameter, mildly enlarged since the previous study. 2. Prominent cirrhotic changes throughout the liver. Multiple peripherally enhancing lesions throughout the liver, likely metastatic disease. Progression since the previous study. 3. Multiple poorly defined lucent lesions in the pelvis and T12 vertebral body, likely metastatic. 4. No evidence of pulmonary embolus or aortic dissection. 5. 9 mm diameter left adrenal gland nodule, indeterminate. No significant change. 6. Calcified fibroids in the uterus. 7. Inferior vena caval filter. 8. Emphysema and aortic atherosclerosis.  Aortic Atherosclerosis (ICD10-I70.0) and Emphysema (ICD10-J43.9).   Antimicrobials:       Subjective: Still feels weak, No new complaints.   Objective: Vitals:   06/26/20 0743 06/26/20 1614 06/26/20 2110 06/27/20 0112  BP: 103/73 110/70 107/66 98/65  Pulse: 82 82  73  Resp: 17 17  17   Temp: 98.2 F (36.8 C) 98.4 F (36.9 C)  97.8 F (36.6 C)  TempSrc: Oral   Oral  SpO2: 96% 99%  98%  Weight:      Height:        Intake/Output Summary (Last 24 hours) at 06/27/2020 0753 Last data filed at 06/26/2020 1855 Gross per 24 hour  Intake 240 ml  Output --  Net 240 ml   Filed Weights   06/24/20 1346 06/25/20 0908  Weight: 63.5 kg 63.5 kg    Examination:  General exam: Appears calm and comfortable  Respiratory system: Clear to auscultation.  Respiratory effort normal. Cardiovascular system: S1 & S2 heard, RRR. No JVD, murmurs, rubs, gallops or clicks.  Gastrointestinal system: Abdomen is nondistended, soft and nontender.  Normal bowel sounds heard. Central nervous system: Alert and oriented. Grossly intact Extremities: no edema Skin: warm, dry Psychiatry: Judgement and insight appear normal. Mood & affect appropriate.     Data Reviewed: I have personally reviewed following labs and imaging studies  CBC: Recent Labs  Lab 06/20/20 0840 06/24/20 1403 06/25/20 0554 06/26/20 0937  WBC 6.5 70.4*   70.1* 45.3* 41.4*  NEUTROABS 5.0 65.8*  --  39.4*  HGB 14.3 13.8   14.0   14.0 13.1 13.7  HCT 40.4 41.1   40.5   39.7 36.6 38.9  MCV 94.0 100.0   97.6   96.1 94.1 95.3  PLT 143* 199   197   197 173 656   Basic Metabolic Panel: Recent Labs  Lab 06/20/20 0840 06/24/20 1403 06/25/20 0554 06/26/20 0937  NA 139 139 138 138  K 3.9 4.2 4.1 3.6  CL 101 100 100 97*  CO2 29 28 23 31   GLUCOSE 87 79 64* 137*  BUN 22 15 13 9   CREATININE 0.68 0.70 0.60 0.71  CALCIUM 8.9 9.3 9.0 9.4   GFR: Estimated Creatinine Clearance: 64.8 mL/min (by C-G formula based on SCr of 0.71 mg/dL). Liver Function Tests: Recent Labs  Lab 06/20/20 0840 06/24/20 1403 06/26/20 0937  AST 91* 98* 69*  ALT 61* 80* 60*  ALKPHOS 491* 521* 505*  BILITOT 1.0 1.9* 1.2  PROT 7.1 7.4 7.1  ALBUMIN 3.6 3.8 3.7   No results for input(s): LIPASE, AMYLASE in the last 168 hours. No results for input(s): AMMONIA in the last 168 hours. Coagulation Profile: No results for input(s): INR, PROTIME in the last 168 hours. Cardiac Enzymes: No results for input(s): CKTOTAL, CKMB, CKMBINDEX, TROPONINI in the last 168 hours. BNP (last 3 results) No results for input(s): PROBNP in the last 8760 hours. HbA1C: No results for input(s): HGBA1C in the last 72 hours. CBG: No results for input(s): GLUCAP in the last 168 hours. Lipid Profile: No results for input(s): CHOL,  HDL, LDLCALC, TRIG, CHOLHDL, LDLDIRECT in the last 72 hours. Thyroid Function Tests: No results for input(s): TSH, T4TOTAL, FREET4, T3FREE, THYROIDAB in the last 72 hours. Anemia Panel: No results for input(s): VITAMINB12, FOLATE, FERRITIN, TIBC, IRON, RETICCTPCT in the last 72 hours. Sepsis Labs: No results for input(s): PROCALCITON, LATICACIDVEN in the last 168 hours.  Recent Results (from the past 240 hour(s))  Blood culture (routine x 2)     Status: None (Preliminary result)   Collection Time: 06/24/20  8:55 PM   Specimen: BLOOD  Result Value Ref Range Status   Specimen Description BLOOD BLOOD RIGHT ARM  Final   Special Requests   Final    BOTTLES DRAWN AEROBIC AND ANAEROBIC Blood Culture results may not be optimal due to an inadequate volume of blood received in culture bottles   Culture   Final    NO GROWTH 3 DAYS Performed at [facility], [address] [place], [place] [address]    Report Status PENDING  Incomplete  Respiratory Panel by RT PCR (Flu A&B, Covid) - Nasopharyngeal Swab     Status: None   Collection Time: 06/24/20  8:55 PM   Specimen: Nasopharyngeal Swab  Result Value Ref Range Status   SARS Coronavirus 2 by RT PCR NEGATIVE NEGATIVE Final    Comment: (NOTE) SARS-CoV-2 target nucleic acids are NOT DETECTED.  The SARS-CoV-2 RNA is generally detectable in upper respiratoy specimens during the acute phase of infection. The lowest concentration of SARS-CoV-2 viral copies this assay can detect is 131 copies/mL. A negative result does not preclude SARS-Cov-2 infection and should not be used as the sole basis for treatment or other patient management decisions. A negative result may occur with  improper specimen collection/handling, submission of specimen other than nasopharyngeal swab, presence of viral mutation(s) within the areas targeted by this assay, and inadequate number of viral copies (<131 copies/mL). A negative result must be combined with  clinical observations, patient history, and epidemiological information. The expected result is Negative.  Fact Sheet for Patients:  PinkCheek.be  Fact Sheet for Healthcare Providers:  GravelBags.it  This test is no t yet approved or cleared by the [place] FDA and  has been authorized for detection and/or diagnosis of SARS-CoV-2 by FDA under an Emergency Use Authorization (EUA). This EUA will remain  in effect (meaning this test can be used) for the duration of the COVID-19 declaration under Section 564(b)(1) of the Act, 21 U.S.C. section 360bbb-3(b)(1), unless the authorization is terminated or revoked sooner.     Influenza A by PCR NEGATIVE NEGATIVE Final   Influenza B by PCR NEGATIVE NEGATIVE Final    Comment: (NOTE) The Xpert Xpress SARS-CoV-2/FLU/RSV assay is intended as an aid in  the diagnosis of influenza from Nasopharyngeal swab specimens and  should not be used as a sole basis for treatment. Nasal washings and  aspirates are unacceptable for  Xpert Xpress SARS-CoV-2/FLU/RSV  testing.  Fact Sheet for Patients: PinkCheek.be  Fact Sheet for Healthcare Providers: GravelBags.it  This test is not yet approved or cleared by the [place] FDA and  has been authorized for detection and/or diagnosis of SARS-CoV-2 by  FDA under an Emergency Use Authorization (EUA). This EUA will remain  in effect (meaning this test can be used) for the duration of the  Covid-19 declaration under Section 564(b)(1) of the Act, 21  U.S.C. section 360bbb-3(b)(1), unless the authorization is  terminated or revoked. Performed at [facility], [address] [place], [place] [address]   Culture, blood (Routine X 2) w Reflex to ID Panel     Status: None (Preliminary result)   Collection Time: 06/25/20  8:05 PM   Specimen: BLOOD  Result Value Ref Range Status    Specimen Description BLOOD BLOOD RIGHT HAND  Final   Special Requests   Final    BOTTLES DRAWN AEROBIC ONLY Blood Culture results may not be optimal due to an inadequate volume of blood received in culture bottles   Culture   Final    NO GROWTH 2 DAYS Performed at [facility], [address], [place], [place] [address]    Report Status PENDING  Incomplete         Radiology Studies: NM Bone Scan Whole Body  Result Date: 06/26/2020 CLINICAL DATA:  LEFT breast cancer, assess response to ongoing treatment, recent onset of LEFT hip pain, no known injury EXAM: NUCLEAR MEDICINE WHOLE BODY BONE SCAN TECHNIQUE: Whole body anterior and posterior images were obtained approximately 3 hours after intravenous injection of radiopharmaceutical. RADIOPHARMACEUTICALS:  19.63 mCi Technetium-37m MDP IV COMPARISON:  None Radiographic correlation: CT chest abdomen pelvis 06/24/2020 FINDINGS: Multiple sites of abnormal osseous tracer uptake are seen consistent with osseous metastatic disease. These include BILATERAL ribs, RIGHT clavicle, thoracic and lumbar vertebra, pelvis, and proximal RIGHT femoral diaphysis. Additional uptake identified at RIGHT lateral femoral condyle, concerning for additional distal femoral metaphysis. Questionable uptake distal LEFT humeral diaphysis. Expected urinary tract and soft tissue distribution of tracer. IMPRESSION: Multiple sites of abnormal tracer accumulation as above consistent with osseous metastases. These include proximal RIGHT femoral diaphysis, RIGHT lateral femoral condyle, and questionably distal LEFT humeral diaphysis; consider radiographic assessment. Electronically Signed   By: Lavonia Dana M.D.   On: 06/26/2020 12:46   ECHOCARDIOGRAM COMPLETE  Result Date: 06/26/2020    ECHOCARDIOGRAM REPORT   Patient Name:   Sarah Horne Date of Exam: 06/25/2020 Medical Rec #:  [id number]       Height:       66.0 in Accession #:    [id number]      Weight:       140.0 lb Date  of Birth:  Dec 25, 1955       BSA:          1.719 m Patient Age:    58 years        BP:           114/67 mmHg Patient Gender: F               HR:           66 bpm. Exam Location:  [facility] Procedure: 2D Echo Indications:     Cardiomyopathy-Unspecified 425.9 / I42.9  History:         Patient has prior history of Echocardiogram examinations, most  recent 11/29/2019. Risk Factors:Hypertension and Dyslipidemia.  Sonographer:     Avanell Shackleton Referring Phys:  [id number] Cammie Sickle Diagnosing Phys: Nelva Bush MD IMPRESSIONS  1. Left ventricular ejection fraction, by estimation, is 50 to 55%. The left ventricle has low normal function. The left ventricle has no regional wall motion abnormalities. Left ventricular diastolic parameters were normal.  2. Right ventricular systolic function is low normal. The right ventricular size is normal. There is normal pulmonary artery systolic pressure.  3. The mitral valve is normal in structure. Trivial mitral valve regurgitation. No evidence of mitral stenosis.  4. The aortic valve is tricuspid. There is mild thickening of the aortic valve. Aortic valve regurgitation is not visualized. No aortic stenosis is present.  5. The inferior vena cava is normal in size with greater than 50% respiratory variability, suggesting right atrial pressure of 3 mmHg. FINDINGS  Left Ventricle: Left ventricular ejection fraction, by estimation, is 50 to 55%. The left ventricle has low normal function. The left ventricle has no regional wall motion abnormalities. The left ventricular internal cavity size was normal in size. There is no left ventricular hypertrophy. Left ventricular diastolic parameters were normal. Right Ventricle: The right ventricular size is normal. No increase in right ventricular wall thickness. Right ventricular systolic function is low normal. There is normal pulmonary artery systolic pressure. The tricuspid regurgitant velocity is 2.73 m/s,  and with an  assumed right atrial pressure of 3 mmHg, the estimated right ventricular systolic pressure is 95.0 mmHg. Left Atrium: Left atrial size was normal in size. Right Atrium: Right atrial size was normal in size. Pericardium: The pericardium was not well visualized. Mitral Valve: The mitral valve is normal in structure. Trivial mitral valve regurgitation. No evidence of mitral valve stenosis. Tricuspid Valve: The tricuspid valve is normal in structure. Tricuspid valve regurgitation is mild. Aortic Valve: The aortic valve is tricuspid. There is mild thickening of the aortic valve. Aortic valve regurgitation is not visualized. No aortic stenosis is present. Pulmonic Valve: The pulmonic valve was not well visualized. Pulmonic valve regurgitation is not visualized. No evidence of pulmonic stenosis. Aorta: The aortic root is normal in size and structure. Pulmonary Artery: The pulmonary artery is not well seen. Venous: The inferior vena cava is normal in size with greater than 50% respiratory variability, suggesting right atrial pressure of 3 mmHg. IAS/Shunts: The interatrial septum was not well visualized.  LEFT VENTRICLE PLAX 2D LVIDd:         3.98 cm  Diastology LVIDs:         2.55 cm  LV e' medial:    8.06 cm/s LV PW:         0.92 cm  LV E/e' medial:  7.6 LV IVS:        0.72 cm  LV e' lateral:   9.90 cm/s LVOT diam:     2.00 cm  LV E/e' lateral: 6.2 LVOT Area:     3.14 cm  RIGHT VENTRICLE            IVC RV S prime:     9.90 cm/s  IVC diam: 0.87 cm TAPSE (M-mode): 2.3 cm LEFT ATRIUM             Index       RIGHT ATRIUM           Index LA diam:        2.70 cm 1.57 cm/m  RA Area:     15.70 cm LA Vol (A2C):  43.1 ml 25.08 ml/m RA Volume:   37.60 ml  21.88 ml/m LA Vol (A4C):   24.3 ml 14.14 ml/m LA Biplane Vol: 33.2 ml 19.32 ml/m   AORTA Ao Root diam: 3.20 cm MITRAL VALVE               TRICUSPID VALVE MV Area (PHT): 3.60 cm    TR Peak grad:   29.8 mmHg MV Decel Time: 211 msec    TR Vmax:        273.00 cm/s MV E  velocity: 61.10 cm/s MV A velocity: 67.40 cm/s  SHUNTS MV E/A ratio:  0.91        Systemic Diam: 2.00 cm Nelva Bush MD Electronically signed by Nelva Bush MD Signature Date/Time: 06/26/2020/6:55:51 AM    Final         Scheduled Meds:  apixaban  5 mg Oral BID   Chlorhexidine Gluconate Cloth  6 each Topical Daily   flecainide  50 mg Oral BID   levETIRAcetam  250 mg Oral BID   loratadine  10 mg Oral Daily   magnesium oxide  400 mg Oral BID   metoprolol tartrate  25 mg Oral BID   montelukast  10 mg Oral QHS   pregabalin  100 mg Oral BID   Vitamin D (Ergocalciferol)  50,000 Units Oral Q7 days   Continuous Infusions:  Assessment & Plan:   Active Problems:   Carcinoma of upper-outer quadrant of left breast in female, estrogen receptor positive ([place])   Brain metastasis (HCC)   Weakness   Leukocytosis   History of pulmonary embolus (PE)   History of supraventricular tachycardia   History of seizure  Significant leukocytosis in the setting of metastatic breast cancer Patient presented with WBC of 70.4 CT chest and CT abdomen does not show any sources of infection.  Doubt from infection. Oncology believes this is related from chemotherapy bcx TDN, will f/u results.   Metastatic breast cancer to brain/lung/liver For CT chest and CT abdomen it seems patient has worsening progression of her metastatic disease.  There is some poorly defined lucent lesions in her pelvic T12 vertebral body that is likely metastatic. Oncology following. Rec. Discontinue gemcitabine.  Echo EF 50-55%, done for assessment to see if able to start doxil chemo.   Weakness- still feels very weak, and unable to ambulate well, at fall risk. Concern since on a/c for hx/o blood clot.  PT reevaluated pt, rec. SNF.    History of PE/DVT On Eliquis  Hx of SVT On Flecainide, will continue.  No events here.   Hx of seizure due to leptomeningeal metastatic disease Will continue on  Keppra.    DVT prophylaxis: Eliquis Code Status:full Family Communication: none at bedside, spoke to her oncologist Dr. Rogue Bussing extensively about her current status.  Status is: Observation  The patient remains OBS appropriate and will d/c before 2 midnights.  Dispo: The patient is from: Home              Anticipated d/c is to: SNF              Anticipated d/c date is: 2 days              Patient currently is medically safe, needs safe d/c planning            LOS: 0 days   Time spent: 35 min with >50% on coc    Nolberto Hanlon, MD [facility] Pager [phone]  If 7PM-7AM,  please contact night-coverage [email or web address] Password [facility] 06/27/2020, 7:53 AM

## 2020-06-27 NOTE — Progress Notes (Signed)
Sarah Horne   DOB:[birth year]   KM#:[id number]    Subjective: No acute events overnight.  No nausea or vomiting.  Overall continues to feel poorly.  Complains of fatigue.  No headaches.  Awaiting placement.   Objective:  Vitals:   06/27/20 0809 06/27/20 1334  BP: 109/73 106/66  Pulse: 94 67  Resp: 18 18  Temp: 98 F (36.7 C) 97.9 F (36.6 C)  SpO2: 97% 100%     Intake/Output Summary (Last 24 hours) at 06/27/2020 1340 Last data filed at 06/27/2020 0808 Gross per 24 hour  Intake 240 ml  Output 750 ml  Net -510 ml    Physical Exam HENT:     Head: Normocephalic and atraumatic.     Mouth/Throat:     Pharynx: No oropharyngeal exudate.  Eyes:     Pupils: Pupils are equal, round, and reactive to light.  Cardiovascular:     Rate and Rhythm: Normal rate and regular rhythm.  Pulmonary:     Effort: No respiratory distress.     Breath sounds: No wheezing.     Comments: Decreased breath sounds bilaterally the bases.  Abdominal:     General: Bowel sounds are normal. There is no distension.     Palpations: Abdomen is soft. There is no mass.     Tenderness: There is no abdominal tenderness. There is no guarding or rebound.  Musculoskeletal:        General: No tenderness. Normal range of motion.     Cervical back: Normal range of motion and neck supple.  Skin:    General: Skin is warm.  Neurological:     Mental Status: She is alert and oriented to person, place, and time.  Psychiatric:        Mood and Affect: Affect normal.      Labs:  Lab Results  Component Value Date   WBC 41.4 (H) 06/26/2020   HGB 13.7 06/26/2020   HCT 38.9 06/26/2020   MCV 95.3 06/26/2020   PLT 178 06/26/2020   NEUTROABS 39.4 (H) 06/26/2020    Lab Results  Component Value Date   NA 138 06/26/2020   K 3.6 06/26/2020   CL 97 (L) 06/26/2020   CO2 31 06/26/2020    Studies:  NM Bone Scan Whole Body  Result Date: 06/26/2020 CLINICAL DATA:  LEFT breast cancer, assess response to ongoing treatment,  recent onset of LEFT hip pain, no known injury EXAM: NUCLEAR MEDICINE WHOLE BODY BONE SCAN TECHNIQUE: Whole body anterior and posterior images were obtained approximately 3 hours after intravenous injection of radiopharmaceutical. RADIOPHARMACEUTICALS:  19.63 mCi Technetium-21mMDP IV COMPARISON:  None Radiographic correlation: CT chest abdomen pelvis 06/24/2020 FINDINGS: Multiple sites of abnormal osseous tracer uptake are seen consistent with osseous metastatic disease. These include BILATERAL ribs, RIGHT clavicle, thoracic and lumbar vertebra, pelvis, and proximal RIGHT femoral diaphysis. Additional uptake identified at RIGHT lateral femoral condyle, concerning for additional distal femoral metaphysis. Questionable uptake distal LEFT humeral diaphysis. Expected urinary tract and soft tissue distribution of tracer. IMPRESSION: Multiple sites of abnormal tracer accumulation as above consistent with osseous metastases. These include proximal RIGHT femoral diaphysis, RIGHT lateral femoral condyle, and questionably distal LEFT humeral diaphysis; consider radiographic assessment. Electronically Signed   By: MLavonia DanaM.D.   On: 06/26/2020 12:46   ECHOCARDIOGRAM COMPLETE  Result Date: 06/26/2020    ECHOCARDIOGRAM REPORT   Patient Name:   BJORDIE SKALSKYDate of Exam: 06/25/2020 Medical Rec #:  [id number]  Height:       66.0 in Accession #:    [id number]      Weight:       140.0 lb Date of Birth:  26-May-1956       BSA:          1.719 m Patient Age:    13 years        BP:           114/67 mmHg Patient Gender: F               HR:           66 bpm. Exam Location:  [facility] Procedure: 2D Echo Indications:     Cardiomyopathy-Unspecified 425.9 / I42.9  History:         Patient has prior history of Echocardiogram examinations, most                  recent 11/29/2019. Risk Factors:Hypertension and Dyslipidemia.  Sonographer:     Avanell Shackleton Referring Phys:  [id number] Cammie Sickle Diagnosing Phys: Nelva Bush MD  IMPRESSIONS  1. Left ventricular ejection fraction, by estimation, is 50 to 55%. The left ventricle has low normal function. The left ventricle has no regional wall motion abnormalities. Left ventricular diastolic parameters were normal.  2. Right ventricular systolic function is low normal. The right ventricular size is normal. There is normal pulmonary artery systolic pressure.  3. The mitral valve is normal in structure. Trivial mitral valve regurgitation. No evidence of mitral stenosis.  4. The aortic valve is tricuspid. There is mild thickening of the aortic valve. Aortic valve regurgitation is not visualized. No aortic stenosis is present.  5. The inferior vena cava is normal in size with greater than 50% respiratory variability, suggesting right atrial pressure of 3 mmHg. FINDINGS  Left Ventricle: Left ventricular ejection fraction, by estimation, is 50 to 55%. The left ventricle has low normal function. The left ventricle has no regional wall motion abnormalities. The left ventricular internal cavity size was normal in size. There is no left ventricular hypertrophy. Left ventricular diastolic parameters were normal. Right Ventricle: The right ventricular size is normal. No increase in right ventricular wall thickness. Right ventricular systolic function is low normal. There is normal pulmonary artery systolic pressure. The tricuspid regurgitant velocity is 2.73 m/s,  and with an assumed right atrial pressure of 3 mmHg, the estimated right ventricular systolic pressure is 47.8 mmHg. Left Atrium: Left atrial size was normal in size. Right Atrium: Right atrial size was normal in size. Pericardium: The pericardium was not well visualized. Mitral Valve: The mitral valve is normal in structure. Trivial mitral valve regurgitation. No evidence of mitral valve stenosis. Tricuspid Valve: The tricuspid valve is normal in structure. Tricuspid valve regurgitation is mild. Aortic Valve: The aortic valve is tricuspid.  There is mild thickening of the aortic valve. Aortic valve regurgitation is not visualized. No aortic stenosis is present. Pulmonic Valve: The pulmonic valve was not well visualized. Pulmonic valve regurgitation is not visualized. No evidence of pulmonic stenosis. Aorta: The aortic root is normal in size and structure. Pulmonary Artery: The pulmonary artery is not well seen. Venous: The inferior vena cava is normal in size with greater than 50% respiratory variability, suggesting right atrial pressure of 3 mmHg. IAS/Shunts: The interatrial septum was not well visualized.  LEFT VENTRICLE PLAX 2D LVIDd:         3.98 cm  Diastology LVIDs:  2.55 cm  LV e' medial:    8.06 cm/s LV PW:         0.92 cm  LV E/e' medial:  7.6 LV IVS:        0.72 cm  LV e' lateral:   9.90 cm/s LVOT diam:     2.00 cm  LV E/e' lateral: 6.2 LVOT Area:     3.14 cm  RIGHT VENTRICLE            IVC RV S prime:     9.90 cm/s  IVC diam: 0.87 cm TAPSE (M-mode): 2.3 cm LEFT ATRIUM             Index       RIGHT ATRIUM           Index LA diam:        2.70 cm 1.57 cm/m  RA Area:     15.70 cm LA Vol (A2C):   43.1 ml 25.08 ml/m RA Volume:   37.60 ml  21.88 ml/m LA Vol (A4C):   24.3 ml 14.14 ml/m LA Biplane Vol: 33.2 ml 19.32 ml/m   AORTA Ao Root diam: 3.20 cm MITRAL VALVE               TRICUSPID VALVE MV Area (PHT): 3.60 cm    TR Peak grad:   29.8 mmHg MV Decel Time: 211 msec    TR Vmax:        273.00 cm/s MV E velocity: 61.10 cm/s MV A velocity: 67.40 cm/s  SHUNTS MV E/A ratio:  0.91        Systemic Diam: 2.00 cm Harrell Gave End MD Electronically signed by Nelva Bush MD Signature Date/Time: 06/26/2020/6:55:51 AM    Final     Carcinoma of upper-outer quadrant of left breast in female, estrogen receptor positive [facility]) # 64 year old female patient with with widespread metastatic breast cancer with multiple medical problems is currently admitted to hospital for generalized weakness/left hip pain; incidentally noted to have elevated white  count of 60,000.  # Recurrent metastatic breast cancer-lung/liver metastases ER PR positive HER-2 negative-most recently on gemcitabine.  June 24, 2020/inpatient CT scan chest and pelvis-progressive disease in the lung/bones.;  Bone scan shows multiple bony metastases.  Long discussion with patient given the progression of disease-options include Doxil [see below] vs. best supportive care given patient declining performance status/multiple comorbidities etc.  Will review further at next clinic visit.  # Leukocytosis white count 60,000 predominant neutrophilia likely growth factor support [clinically less likely of infectious etiology]-slowly trending down.  Yesterday white count 45.  # Gait instability- worse [Hx of LP disease/RT]-progressive getting worse again likely secondary to progressive malignancy; s/p physical therapy evaluation.  Awaiting placement today.  # cardiomyopathy/ Sinus tachycardia- on metoprolol-/Tamabcor; Luetta Nutting 2021- 2d-echo- 40-45%]-2D echo-September 2021-50 to 55% ejection fraction.  Will need to monitor closely on Doxil.  # Elevated LFTs/alkaline phosphatase 510-likely secondary to progressive malignancy.  Stable  # Peripheral neuropathy grade 1-2-  on Lyrica.  Stable  # Right lower extremity DVT bilateral [November 28, 2019] CT scan abdomen pelvis negative for any DVT.  On eliquis.  Stable  #Overall prognosis: Is unfortunately poor given s/p multiple lines of therapies; bulky disease; borderline performance status; multiple comorbidities including cardiomyopathy.  Patient understand that she is high risk of death/complications from chemotherapy. Discussed the potential side effects including but not limited to-increasing fatigue, nausea vomiting, diarrhea, hair loss, sores in the mouth, increase risk of infection and also neuropathy; cardiac complications was  also discussed.  Patient is still interested in pursuing chemo; however will reassess next week in the clinic to  decide if you want to proceed with chemotherapy. Will discuss with pt's brother.  Discussed with Dr.Amery-stable from oncology standpoint to be discharged to SNF.  # 40 minutes face-to-face with the patient discussing the above plan of care; more than 50% of time spent on prognosis/ natural history; counseling and coordination.  # I reviewed the blood work- with the patient in detail; also reviewed the imaging independently [as summarized above]; and with the patient in detail.    Cammie Sickle, MD 06/27/2020  1:40 PM

## 2020-06-27 NOTE — Progress Notes (Signed)
DISCONTINUE ON PATHWAY REGIMEN - Breast     A cycle is every 21 days:     Gemcitabine   **Always confirm dose/schedule in your pharmacy ordering system**  REASON: Disease Progression PRIOR TREATMENT: [id number]: Gemcitabine 800 mg/m2 D1, 8 q21 Days TREATMENT RESPONSE: Progressive Disease (PD)  START ON PATHWAY REGIMEN - Breast     A cycle is every 28 days:     Liposomal doxorubicin   **Always confirm dose/schedule in your pharmacy ordering system**  Patient Characteristics: Distant Metastases or Locoregional Recurrent Disease - Unresected or Locally Advanced Unresectable Disease Progressing after Neoadjuvant and Local Therapies, HER2 Negative/Unknown/Equivocal, ER Positive, Chemotherapy, Third Line and Beyond, Anthracycline  Candidate Therapeutic Status: Distant Metastases ER Status: Positive (+) HER2 Status: Negative (-) PR Status: Negative (-) Therapy Approach Indicated: Standard Chemotherapy/Endocrine Therapy Line of Therapy: Third Line and Beyond Intent of Therapy: Non-Curative / Palliative Intent, Discussed with Patient

## 2020-06-27 NOTE — Progress Notes (Signed)
Called report to Tish at [facility]. Patient aware of discharge. IV dc'd.

## 2020-06-27 NOTE — Discharge Summary (Signed)
Sarah Horne TDV:[id number] DOB: Aug 21, 1956 DOA: 06/24/2020  PCP: Delsa Grana, PA-C  Admit date: 06/24/2020 Discharge date: 06/27/2020  Admitted From: home Disposition:  [facility]  Recommendations for Outpatient Follow-up:  1. Follow up with PCP in 1 week 2. Please obtain BMP/CBC in one week 3. Dr. Rogue Bussing oncology in one week     Discharge Condition:Stable CODE STATUS:full  Diet recommendation: Heart Healthy   Brief/Interim Summary: Sarah Horne is a 64 y.o. female with medical history significant for metastatic breast cancer/leptomeningeal disease metastatic to brain on gemcitabine, hx of seizure, SVT, cardiomyopathy, PE/DVT on Eliquis, history of GI bleed and hypertension who presents with concerns of weakness.CT chest/abdomen results below.  Collagen was consulted.  Physical therapy and Occupational Therapy were also consulted.  Significant leukocytosis in the setting of metastatic breast cancer Patient presented with WBC of 70.4, improving.  CT chest and CT abdomen does not show any sources of infection.  Doubt from infection. Oncology believes this is related from chemotherapy bcx so far negative.   Metastatic breast cancer to brain/lung/liver For CT chest and CT abdomen it seems patient has worsening progression of her metastatic disease. There is some poorly defined lucent lesions in her pelvic T12 vertebral body that is likely metastatic. Oncology following. Recommended discontinue of Gemcitabine.  Echo EF 50-55%, done for assessment to see if able to start doxil chemo.? Oncology will discuss treatment options as outpatient    Weakness- still feels very weak and  at fall risk. Concern since on anticoagulation.  Will be going to SNF today.Marland Kitchen     History of PE/DVT On Eliquis  Hx of SVT On Flecainide No events here.   Hx of seizure due to leptomeningeal metastatic disease continue on Keppra.  Discharge Diagnoses:  Active Problems:    Carcinoma of upper-outer quadrant of left breast in female, estrogen receptor positive ([place])   Brain metastasis (HCC)   Weakness   Leukocytosis   History of pulmonary embolus (PE)   History of supraventricular tachycardia   History of seizure    Discharge Instructions  Discharge Instructions    Call MD for:  severe uncontrolled pain   Complete by: As directed    Diet - low sodium heart healthy   Complete by: As directed    Increase activity slowly   Complete by: As directed      Allergies as of 06/27/2020      Reactions   No Known Allergies       Medication List    TAKE these medications   Eliquis 5 MG Tabs tablet Generic drug: apixaban TAKE 1 TABLET BY MOUTH TWICE A DAY   flecainide 50 MG tablet Commonly known as: TAMBOCOR Take 1 tablet (50 mg total) by mouth 2 (two) times daily.   levETIRAcetam 250 MG tablet Commonly known as: KEPPRA Take 1 tablet by mouth 2 (two) times daily.   levocetirizine 5 MG tablet Commonly known as: XYZAL Take 1 tablet by mouth daily.   magnesium oxide 400 MG tablet Commonly known as: MAG-OX TAKE 1 TABLET BY MOUTH TWICE A DAY   meclizine 25 MG tablet Commonly known as: ANTIVERT Take 1 tablet (25 mg total) by mouth 3 (three) times daily as needed for dizziness.   metoprolol tartrate 25 MG tablet Commonly known as: LOPRESSOR TAKE 1 TABLET BY MOUTH TWICE A DAY   montelukast 10 MG tablet Commonly known as: SINGULAIR TAKE 1 TABLET BY MOUTH EVERYDAY AT BEDTIME   ondansetron 8 MG tablet Commonly known  as: ZOFRAN One pill every 8 hours as needed for nausea/vomitting.   pregabalin 100 MG capsule Commonly known as: LYRICA TAKE 1 CAPSULE BY MOUTH TWICE A DAY   prochlorperazine 10 MG tablet Commonly known as: COMPAZINE Take 1 tablet (10 mg total) by mouth every 6 (six) hours as needed for nausea or vomiting.   Vitamin D (Ergocalciferol) 1.25 MG (50000 UNIT) Caps capsule Commonly known as: DRISDOL TAKE 1 CAPSULE (50,000 UNITS  TOTAL) ONCE A WEEK BY MOUTH.       Follow-up Information    Cammie Sickle, MD Follow up in 1 week(s).   Specialties: Internal Medicine, Oncology Contact information: [place] [address] [phone]              Allergies  Allergen Reactions  . No Known Allergies     Consultations:  oncology   Procedures/Studies: CT Chest W Contrast  Result Date: 06/24/2020 CLINICAL DATA:  Metastatic cancer with back pain for couple of months. Pain is worse with moving. Weakness. EXAM: CT CHEST, ABDOMEN, AND PELVIS WITH CONTRAST TECHNIQUE: Multidetector CT imaging of the chest, abdomen and pelvis was performed following the standard protocol during bolus administration of intravenous contrast. CONTRAST:  151mL OMNIPAQUE IOHEXOL 300 MG/ML  SOLN COMPARISON:  CT chest abdomen and pelvis 03/26/2020 FINDINGS: CT CHEST FINDINGS Cardiovascular: Mild cardiac enlargement. No pericardial effusions. Normal caliber thoracic aorta. No aortic dissection. Great vessel origins are patent. Good opacification of the central and segmental pulmonary arteries. No evidence of pulmonary embolus. Mediastinum/Nodes: Esophagus is decompressed. No significant lymphadenopathy. Right-sided Infuse-A-Port with tip in the superior vena cava. Lungs/Pleura: Thick-walled cavitary lesion with spiculation in the left apex measuring 3.1 x 3.4 cm in diameter. The lesion is mildly enlarged since the previous study. Mild scarring or nodularity in the right apex. No consolidation or edema. Scattered emphysematous changes. Slight fibrosis along the anterior left chest wall may represent radiation change if there is a history. No pleural effusions. No pneumothorax. Musculoskeletal: Calcifications in the left breast. Normal alignment of the thoracic spine. Degenerative changes. No vertebral compression deformities. Poorly defined vague lucency in the T12 vertebral body with slight irregularity of the cortical surface  and mild adjacent soft tissue swelling. This is likely bone metastasis. CT ABDOMEN PELVIS FINDINGS Hepatobiliary: Prominent cirrhotic changes throughout the liver with right lobe atrophy and enlarged left lobe and caudate lobe. Prominent nodular contour with fibrotic bands. Multiple peripherally enhancing lesions throughout the liver. Largest is in segment 4, measuring about 2.4 cm diameter but most are and the 1-2 cm diameter range. This is likely metastatic disease. Progression since the previous study. Gallbladder and bile ducts are unremarkable. Pancreas: Unremarkable. No pancreatic ductal dilatation or surrounding inflammatory changes. Spleen: Normal in size without focal abnormality. Adrenals/Urinary Tract: Left adrenal gland nodule measuring 9 mm diameter, indeterminate. No significant change. Kidneys are symmetrical. Nephrograms are homogeneous. No hydronephrosis or hydroureter. Bladder is normal. Stomach/Bowel: The stomach, small bowel, and colon are not abnormally distended. Stool fills the colon. Appendix is not identified. Vascular/Lymphatic: Calcification of the aorta. No aneurysm. Inferior vena caval filter. No significant retroperitoneal lymphadenopathy. Reproductive: Calcified fibroids in the uterus. No abnormal adnexal masses. Other: No free air or free fluid in the abdomen. Abdominal wall musculature appears intact. Musculoskeletal: Sclerosis adjacent to the pedicle of L4 and multiple poorly defined lucent lesions in the pelvis bilaterally, most prominent in the right superior pubic ramus, likely metastatic. No vertebral compression deformities. Degenerative changes in the hips. IMPRESSION: 1. Thick-walled  cavitary lesion in the left apex measuring 3.1 x 3.4 cm in diameter, mildly enlarged since the previous study. 2. Prominent cirrhotic changes throughout the liver. Multiple peripherally enhancing lesions throughout the liver, likely metastatic disease. Progression since the previous study. 3.  Multiple poorly defined lucent lesions in the pelvis and T12 vertebral body, likely metastatic. 4. No evidence of pulmonary embolus or aortic dissection. 5. 9 mm diameter left adrenal gland nodule, indeterminate. No significant change. 6. Calcified fibroids in the uterus. 7. Inferior vena caval filter. 8. Emphysema and aortic atherosclerosis. Aortic Atherosclerosis (ICD10-I70.0) and Emphysema (ICD10-J43.9). Electronically Signed   By: Lucienne Capers M.D.   On: 06/24/2020 23:01   NM Bone Scan Whole Body  Result Date: 06/26/2020 CLINICAL DATA:  LEFT breast cancer, assess response to ongoing treatment, recent onset of LEFT hip pain, no known injury EXAM: NUCLEAR MEDICINE WHOLE BODY BONE SCAN TECHNIQUE: Whole body anterior and posterior images were obtained approximately 3 hours after intravenous injection of radiopharmaceutical. RADIOPHARMACEUTICALS:  19.63 mCi Technetium-63m MDP IV COMPARISON:  None Radiographic correlation: CT chest abdomen pelvis 06/24/2020 FINDINGS: Multiple sites of abnormal osseous tracer uptake are seen consistent with osseous metastatic disease. These include BILATERAL ribs, RIGHT clavicle, thoracic and lumbar vertebra, pelvis, and proximal RIGHT femoral diaphysis. Additional uptake identified at RIGHT lateral femoral condyle, concerning for additional distal femoral metaphysis. Questionable uptake distal LEFT humeral diaphysis. Expected urinary tract and soft tissue distribution of tracer. IMPRESSION: Multiple sites of abnormal tracer accumulation as above consistent with osseous metastases. These include proximal RIGHT femoral diaphysis, RIGHT lateral femoral condyle, and questionably distal LEFT humeral diaphysis; consider radiographic assessment. Electronically Signed   By: Lavonia Dana M.D.   On: 06/26/2020 12:46   CT ABDOMEN PELVIS W CONTRAST  Result Date: 06/24/2020 CLINICAL DATA:  Metastatic cancer with back pain for couple of months. Pain is worse with moving. Weakness. EXAM:  CT CHEST, ABDOMEN, AND PELVIS WITH CONTRAST TECHNIQUE: Multidetector CT imaging of the chest, abdomen and pelvis was performed following the standard protocol during bolus administration of intravenous contrast. CONTRAST:  141mL OMNIPAQUE IOHEXOL 300 MG/ML  SOLN COMPARISON:  CT chest abdomen and pelvis 03/26/2020 FINDINGS: CT CHEST FINDINGS Cardiovascular: Mild cardiac enlargement. No pericardial effusions. Normal caliber thoracic aorta. No aortic dissection. Great vessel origins are patent. Good opacification of the central and segmental pulmonary arteries. No evidence of pulmonary embolus. Mediastinum/Nodes: Esophagus is decompressed. No significant lymphadenopathy. Right-sided Infuse-A-Port with tip in the superior vena cava. Lungs/Pleura: Thick-walled cavitary lesion with spiculation in the left apex measuring 3.1 x 3.4 cm in diameter. The lesion is mildly enlarged since the previous study. Mild scarring or nodularity in the right apex. No consolidation or edema. Scattered emphysematous changes. Slight fibrosis along the anterior left chest wall may represent radiation change if there is a history. No pleural effusions. No pneumothorax. Musculoskeletal: Calcifications in the left breast. Normal alignment of the thoracic spine. Degenerative changes. No vertebral compression deformities. Poorly defined vague lucency in the T12 vertebral body with slight irregularity of the cortical surface and mild adjacent soft tissue swelling. This is likely bone metastasis. CT ABDOMEN PELVIS FINDINGS Hepatobiliary: Prominent cirrhotic changes throughout the liver with right lobe atrophy and enlarged left lobe and caudate lobe. Prominent nodular contour with fibrotic bands. Multiple peripherally enhancing lesions throughout the liver. Largest is in segment 4, measuring about 2.4 cm diameter but most are and the 1-2 cm diameter range. This is likely metastatic disease. Progression since the previous study. Gallbladder and bile  ducts are  unremarkable. Pancreas: Unremarkable. No pancreatic ductal dilatation or surrounding inflammatory changes. Spleen: Normal in size without focal abnormality. Adrenals/Urinary Tract: Left adrenal gland nodule measuring 9 mm diameter, indeterminate. No significant change. Kidneys are symmetrical. Nephrograms are homogeneous. No hydronephrosis or hydroureter. Bladder is normal. Stomach/Bowel: The stomach, small bowel, and colon are not abnormally distended. Stool fills the colon. Appendix is not identified. Vascular/Lymphatic: Calcification of the aorta. No aneurysm. Inferior vena caval filter. No significant retroperitoneal lymphadenopathy. Reproductive: Calcified fibroids in the uterus. No abnormal adnexal masses. Other: No free air or free fluid in the abdomen. Abdominal wall musculature appears intact. Musculoskeletal: Sclerosis adjacent to the pedicle of L4 and multiple poorly defined lucent lesions in the pelvis bilaterally, most prominent in the right superior pubic ramus, likely metastatic. No vertebral compression deformities. Degenerative changes in the hips. IMPRESSION: 1. Thick-walled cavitary lesion in the left apex measuring 3.1 x 3.4 cm in diameter, mildly enlarged since the previous study. 2. Prominent cirrhotic changes throughout the liver. Multiple peripherally enhancing lesions throughout the liver, likely metastatic disease. Progression since the previous study. 3. Multiple poorly defined lucent lesions in the pelvis and T12 vertebral body, likely metastatic. 4. No evidence of pulmonary embolus or aortic dissection. 5. 9 mm diameter left adrenal gland nodule, indeterminate. No significant change. 6. Calcified fibroids in the uterus. 7. Inferior vena caval filter. 8. Emphysema and aortic atherosclerosis. Aortic Atherosclerosis (ICD10-I70.0) and Emphysema (ICD10-J43.9). Electronically Signed   By: Lucienne Capers M.D.   On: 06/24/2020 23:01   ECHOCARDIOGRAM COMPLETE  Result Date:  06/26/2020    ECHOCARDIOGRAM REPORT   Patient Name:   Sarah Horne Date of Exam: 06/25/2020 Medical Rec #:  [id number]       Height:       66.0 in Accession #:    [id number]      Weight:       140.0 lb Date of Birth:  [birth year]       BSA:          1.719 m Patient Age:    35 years        BP:           114/67 mmHg Patient Gender: F               HR:           66 bpm. Exam Location:  [facility] Procedure: 2D Echo Indications:     Cardiomyopathy-Unspecified 425.9 / I42.9  History:         Patient has prior history of Echocardiogram examinations, most                  recent 11/29/2019. Risk Factors:Hypertension and Dyslipidemia.  Sonographer:     Avanell Shackleton Referring Phys:  [id number] Cammie Sickle Diagnosing Phys: Nelva Bush MD IMPRESSIONS  1. Left ventricular ejection fraction, by estimation, is 50 to 55%. The left ventricle has low normal function. The left ventricle has no regional wall motion abnormalities. Left ventricular diastolic parameters were normal.  2. Right ventricular systolic function is low normal. The right ventricular size is normal. There is normal pulmonary artery systolic pressure.  3. The mitral valve is normal in structure. Trivial mitral valve regurgitation. No evidence of mitral stenosis.  4. The aortic valve is tricuspid. There is mild thickening of the aortic valve. Aortic valve regurgitation is not visualized. No aortic stenosis is present.  5. The inferior vena cava is normal in size with greater than 50% respiratory variability, suggesting right  atrial pressure of 3 mmHg. FINDINGS  Left Ventricle: Left ventricular ejection fraction, by estimation, is 50 to 55%. The left ventricle has low normal function. The left ventricle has no regional wall motion abnormalities. The left ventricular internal cavity size was normal in size. There is no left ventricular hypertrophy. Left ventricular diastolic parameters were normal. Right Ventricle: The right ventricular size is normal. No  increase in right ventricular wall thickness. Right ventricular systolic function is low normal. There is normal pulmonary artery systolic pressure. The tricuspid regurgitant velocity is 2.73 m/s,  and with an assumed right atrial pressure of 3 mmHg, the estimated right ventricular systolic pressure is 06.2 mmHg. Left Atrium: Left atrial size was normal in size. Right Atrium: Right atrial size was normal in size. Pericardium: The pericardium was not well visualized. Mitral Valve: The mitral valve is normal in structure. Trivial mitral valve regurgitation. No evidence of mitral valve stenosis. Tricuspid Valve: The tricuspid valve is normal in structure. Tricuspid valve regurgitation is mild. Aortic Valve: The aortic valve is tricuspid. There is mild thickening of the aortic valve. Aortic valve regurgitation is not visualized. No aortic stenosis is present. Pulmonic Valve: The pulmonic valve was not well visualized. Pulmonic valve regurgitation is not visualized. No evidence of pulmonic stenosis. Aorta: The aortic root is normal in size and structure. Pulmonary Artery: The pulmonary artery is not well seen. Venous: The inferior vena cava is normal in size with greater than 50% respiratory variability, suggesting right atrial pressure of 3 mmHg. IAS/Shunts: The interatrial septum was not well visualized.  LEFT VENTRICLE PLAX 2D LVIDd:         3.98 cm  Diastology LVIDs:         2.55 cm  LV e' medial:    8.06 cm/s LV PW:         0.92 cm  LV E/e' medial:  7.6 LV IVS:        0.72 cm  LV e' lateral:   9.90 cm/s LVOT diam:     2.00 cm  LV E/e' lateral: 6.2 LVOT Area:     3.14 cm  RIGHT VENTRICLE            IVC RV S prime:     9.90 cm/s  IVC diam: 0.87 cm TAPSE (M-mode): 2.3 cm LEFT ATRIUM             Index       RIGHT ATRIUM           Index LA diam:        2.70 cm 1.57 cm/m  RA Area:     15.70 cm LA Vol (A2C):   43.1 ml 25.08 ml/m RA Volume:   37.60 ml  21.88 ml/m LA Vol (A4C):   24.3 ml 14.14 ml/m LA Biplane Vol: 33.2  ml 19.32 ml/m   AORTA Ao Root diam: 3.20 cm MITRAL VALVE               TRICUSPID VALVE MV Area (PHT): 3.60 cm    TR Peak grad:   29.8 mmHg MV Decel Time: 211 msec    TR Vmax:        273.00 cm/s MV E velocity: 61.10 cm/s MV A velocity: 67.40 cm/s  SHUNTS MV E/A ratio:  0.91        Systemic Diam: 2.00 cm Nelva Bush MD Electronically signed by Nelva Bush MD Signature Date/Time: 06/26/2020/6:55:51 AM    Final       Subjective: No  complaints this am  Discharge Exam: Vitals:   06/27/20 0112 06/27/20 0809  BP: 98/65 109/73  Pulse: 73 94  Resp: 17 18  Temp: 97.8 F (36.6 C) 98 F (36.7 C)  SpO2: 98% 97%   Vitals:   06/26/20 1614 06/26/20 2110 06/27/20 0112 06/27/20 0809  BP: 110/70 107/66 98/65 109/73  Pulse: 82  73 94  Resp: 17  17 18   Temp: 98.4 F (36.9 C)  97.8 F (36.6 C) 98 F (36.7 C)  TempSrc:   Oral Oral  SpO2: 99%  98% 97%  Weight:      Height:        General: Pt is alert, awake, not in acute distress Cardiovascular: RRR, S1/S2 +, no rubs, no gallops Respiratory: CTA bilaterally, no wheezing, no rhonchi Abdominal: Soft, NT, ND, bowel sounds + Extremities: no edema, no cyanosis    The results of significant diagnostics from this hospitalization (including imaging, microbiology, ancillary and laboratory) are listed below for reference.     Microbiology: Recent Results (from the past 240 hour(s))  Blood culture (routine x 2)     Status: None (Preliminary result)   Collection Time: 06/24/20  8:55 PM   Specimen: BLOOD  Result Value Ref Range Status   Specimen Description BLOOD BLOOD RIGHT ARM  Final   Special Requests   Final    BOTTLES DRAWN AEROBIC AND ANAEROBIC Blood Culture results may not be optimal due to an inadequate volume of blood received in culture bottles   Culture   Final    NO GROWTH 3 DAYS Performed at [facility], [address] [place], [place] [address]    Report Status PENDING  Incomplete  Respiratory Panel by RT PCR  (Flu A&B, Covid) - Nasopharyngeal Swab     Status: None   Collection Time: 06/24/20  8:55 PM   Specimen: Nasopharyngeal Swab  Result Value Ref Range Status   SARS Coronavirus 2 by RT PCR NEGATIVE NEGATIVE Final    Comment: (NOTE) SARS-CoV-2 target nucleic acids are NOT DETECTED.  The SARS-CoV-2 RNA is generally detectable in upper respiratoy specimens during the acute phase of infection. The lowest concentration of SARS-CoV-2 viral copies this assay can detect is 131 copies/mL. A negative result does not preclude SARS-Cov-2 infection and should not be used as the sole basis for treatment or other patient management decisions. A negative result may occur with  improper specimen collection/handling, submission of specimen other than nasopharyngeal swab, presence of viral mutation(s) within the areas targeted by this assay, and inadequate number of viral copies (<131 copies/mL). A negative result must be combined with clinical observations, patient history, and epidemiological information. The expected result is Negative.  Fact Sheet for Patients:  PinkCheek.be  Fact Sheet for Healthcare Providers:  GravelBags.it  This test is no t yet approved or cleared by the [place] FDA and  has been authorized for detection and/or diagnosis of SARS-CoV-2 by FDA under an Emergency Use Authorization (EUA). This EUA will remain  in effect (meaning this test can be used) for the duration of the COVID-19 declaration under Section 564(b)(1) of the Act, 21 U.S.C. section 360bbb-3(b)(1), unless the authorization is terminated or revoked sooner.     Influenza A by PCR NEGATIVE NEGATIVE Final   Influenza B by PCR NEGATIVE NEGATIVE Final    Comment: (NOTE) The Xpert Xpress SARS-CoV-2/FLU/RSV assay is intended as an aid in  the diagnosis of influenza from Nasopharyngeal swab specimens and  should not be used as  a sole basis for treatment.  Nasal washings and  aspirates are unacceptable for Xpert Xpress SARS-CoV-2/FLU/RSV  testing.  Fact Sheet for Patients: PinkCheek.be  Fact Sheet for Healthcare Providers: GravelBags.it  This test is not yet approved or cleared by the [place] FDA and  has been authorized for detection and/or diagnosis of SARS-CoV-2 by  FDA under an Emergency Use Authorization (EUA). This EUA will remain  in effect (meaning this test can be used) for the duration of the  Covid-19 declaration under Section 564(b)(1) of the Act, 21  U.S.C. section 360bbb-3(b)(1), unless the authorization is  terminated or revoked. Performed at [facility], [address] [place], [place] [address]   Culture, blood (Routine X 2) w Reflex to ID Panel     Status: None (Preliminary result)   Collection Time: 06/25/20  8:05 PM   Specimen: BLOOD  Result Value Ref Range Status   Specimen Description BLOOD BLOOD RIGHT HAND  Final   Special Requests   Final    BOTTLES DRAWN AEROBIC ONLY Blood Culture results may not be optimal due to an inadequate volume of blood received in culture bottles   Culture   Final    NO GROWTH 2 DAYS Performed at [facility], [address] [place], [place] [address]    Report Status PENDING  Incomplete     Labs: BNP (last 3 results) No results for input(s): BNP in the last 8760 hours. Basic Metabolic Panel: Recent Labs  Lab 06/24/20 1403 06/25/20 0554 06/26/20 0937  NA 139 138 138  K 4.2 4.1 3.6  CL 100 100 97*  CO2 28 23 31   GLUCOSE 79 64* 137*  BUN 15 13 9   CREATININE 0.70 0.60 0.71  CALCIUM 9.3 9.0 9.4   Liver Function Tests: Recent Labs  Lab 06/24/20 1403 06/26/20 0937  AST 98* 69*  ALT 80* 60*  ALKPHOS 521* 505*  BILITOT 1.9* 1.2  PROT 7.4 7.1  ALBUMIN 3.8 3.7   No results for input(s): LIPASE, AMYLASE in the last 168 hours. No results for input(s): AMMONIA in the last 168  hours. CBC: Recent Labs  Lab 06/24/20 1403 06/25/20 0554 06/26/20 0937  WBC 70.4*  70.1* 45.3* 41.4*  NEUTROABS 65.8*  --  39.4*  HGB 13.8  14.0  14.0 13.1 13.7  HCT 41.1  40.5  39.7 36.6 38.9  MCV 100.0  97.6  96.1 94.1 95.3  PLT 199  197  197 173 178   Cardiac Enzymes: No results for input(s): CKTOTAL, CKMB, CKMBINDEX, TROPONINI in the last 168 hours. BNP: Invalid input(s): POCBNP CBG: No results for input(s): GLUCAP in the last 168 hours. D-Dimer No results for input(s): DDIMER in the last 72 hours. Hgb A1c No results for input(s): HGBA1C in the last 72 hours. Lipid Profile No results for input(s): CHOL, HDL, LDLCALC, TRIG, CHOLHDL, LDLDIRECT in the last 72 hours. Thyroid function studies No results for input(s): TSH, T4TOTAL, T3FREE, THYROIDAB in the last 72 hours.  Invalid input(s): FREET3 Anemia work up No results for input(s): VITAMINB12, FOLATE, FERRITIN, TIBC, IRON, RETICCTPCT in the last 72 hours. Urinalysis    Component Value Date/Time   COLORURINE AMBER (A) 06/24/2020 2028   APPEARANCEUR HAZY (A) 06/24/2020 2028   LABSPEC 1.024 06/24/2020 2028   PHURINE 6.0 06/24/2020 2028   GLUCOSEU NEGATIVE 06/24/2020 2028   HGBUR NEGATIVE 06/24/2020 2028   BILIRUBINUR NEGATIVE 06/24/2020 2028   KETONESUR NEGATIVE 06/24/2020 2028   PROTEINUR NEGATIVE 06/24/2020 2028  NITRITE NEGATIVE 06/24/2020 2028   LEUKOCYTESUR NEGATIVE 06/24/2020 2028   Sepsis Labs Invalid input(s): PROCALCITONIN,  WBC,  LACTICIDVEN Microbiology Recent Results (from the past 240 hour(s))  Blood culture (routine x 2)     Status: None (Preliminary result)   Collection Time: 06/24/20  8:55 PM   Specimen: BLOOD  Result Value Ref Range Status   Specimen Description BLOOD BLOOD RIGHT ARM  Final   Special Requests   Final    BOTTLES DRAWN AEROBIC AND ANAEROBIC Blood Culture results may not be optimal due to an inadequate volume of blood received in culture bottles   Culture   Final    NO  GROWTH 3 DAYS Performed at [facility], [address] [place], [place] [address]    Report Status PENDING  Incomplete  Respiratory Panel by RT PCR (Flu A&B, Covid) - Nasopharyngeal Swab     Status: None   Collection Time: 06/24/20  8:55 PM   Specimen: Nasopharyngeal Swab  Result Value Ref Range Status   SARS Coronavirus 2 by RT PCR NEGATIVE NEGATIVE Final    Comment: (NOTE) SARS-CoV-2 target nucleic acids are NOT DETECTED.  The SARS-CoV-2 RNA is generally detectable in upper respiratoy specimens during the acute phase of infection. The lowest concentration of SARS-CoV-2 viral copies this assay can detect is 131 copies/mL. A negative result does not preclude SARS-Cov-2 infection and should not be used as the sole basis for treatment or other patient management decisions. A negative result may occur with  improper specimen collection/handling, submission of specimen other than nasopharyngeal swab, presence of viral mutation(s) within the areas targeted by this assay, and inadequate number of viral copies (<131 copies/mL). A negative result must be combined with clinical observations, patient history, and epidemiological information. The expected result is Negative.  Fact Sheet for Patients:  PinkCheek.be  Fact Sheet for Healthcare Providers:  GravelBags.it  This test is no t yet approved or cleared by the [place] FDA and  has been authorized for detection and/or diagnosis of SARS-CoV-2 by FDA under an Emergency Use Authorization (EUA). This EUA will remain  in effect (meaning this test can be used) for the duration of the COVID-19 declaration under Section 564(b)(1) of the Act, 21 U.S.C. section 360bbb-3(b)(1), unless the authorization is terminated or revoked sooner.     Influenza A by PCR NEGATIVE NEGATIVE Final   Influenza B by PCR NEGATIVE NEGATIVE Final    Comment: (NOTE) The Xpert Xpress  SARS-CoV-2/FLU/RSV assay is intended as an aid in  the diagnosis of influenza from Nasopharyngeal swab specimens and  should not be used as a sole basis for treatment. Nasal washings and  aspirates are unacceptable for Xpert Xpress SARS-CoV-2/FLU/RSV  testing.  Fact Sheet for Patients: PinkCheek.be  Fact Sheet for Healthcare Providers: GravelBags.it  This test is not yet approved or cleared by the [place] FDA and  has been authorized for detection and/or diagnosis of SARS-CoV-2 by  FDA under an Emergency Use Authorization (EUA). This EUA will remain  in effect (meaning this test can be used) for the duration of the  Covid-19 declaration under Section 564(b)(1) of the Act, 21  U.S.C. section 360bbb-3(b)(1), unless the authorization is  terminated or revoked. Performed at [facility], [address] [place], [place] [address]   Culture, blood (Routine X 2) w Reflex to ID Panel     Status: None (Preliminary result)   Collection Time: 06/25/20  8:05 PM   Specimen: BLOOD  Result Value  Ref Range Status   Specimen Description BLOOD BLOOD RIGHT HAND  Final   Special Requests   Final    BOTTLES DRAWN AEROBIC ONLY Blood Culture results may not be optimal due to an inadequate volume of blood received in culture bottles   Culture   Final    NO GROWTH 2 DAYS Performed at [facility], [address] [place], [place] [address]    Report Status PENDING  Incomplete     Time coordinating discharge: Over 30 minutes  SIGNED:   Nolberto Hanlon, MD  [facility] 06/27/2020, 12:27 PM Pager   If 7PM-7AM, please contact night-coverage [email or web address] Password [email or web address]

## 2020-06-28 ENCOUNTER — Encounter: Payer: Self-pay | Admitting: Nurse Practitioner

## 2020-06-28 ENCOUNTER — Other Ambulatory Visit: Payer: Self-pay

## 2020-06-28 ENCOUNTER — Non-Acute Institutional Stay: Payer: Medicare HMO | Admitting: Nurse Practitioner

## 2020-06-28 VITALS — BP 113/74 | Wt 127.4 lb

## 2020-06-28 DIAGNOSIS — C50919 Malignant neoplasm of unspecified site of unspecified female breast: Secondary | ICD-10-CM

## 2020-06-28 DIAGNOSIS — R531 Weakness: Secondary | ICD-10-CM | POA: Diagnosis not present

## 2020-06-28 DIAGNOSIS — Z170 Estrogen receptor positive status [ER+]: Secondary | ICD-10-CM | POA: Diagnosis not present

## 2020-06-28 DIAGNOSIS — C7931 Secondary malignant neoplasm of brain: Secondary | ICD-10-CM | POA: Diagnosis not present

## 2020-06-28 DIAGNOSIS — Z515 Encounter for palliative care: Secondary | ICD-10-CM

## 2020-06-28 DIAGNOSIS — C50412 Malignant neoplasm of upper-outer quadrant of left female breast: Secondary | ICD-10-CM | POA: Diagnosis not present

## 2020-06-28 NOTE — Progress Notes (Signed)
[facility] Consult Note Telephone: [phone]  Fax: [phone]  PATIENT NAME: Sarah Horne DOB: 07/16/1956 MRN: [id number]  PRIMARY CARE PROVIDER:   Delsa Grana, PA-C  REFERRING PROVIDER:  Delsa Grana, PA-C [place],  [place] [address]  RESPONSIBLE PARTY:      RECOMMENDATIONS and PLAN: 1.ACP:DNR; MOST form completed in [place]; DNR/Limited interventions, wishes for antibiotics, IVF, no feeding tube, continue with chemotherapy  2.Palliative care encounter; Palliative medicine team will continue to support patient, patient's family, and medical team. Visit consisted of counseling and education dealing with the complex and emotionally intense issues of symptom management and palliative care in the setting of serious and potentially life-threatening illness  3. F/u 1 week for ongoing monitoring of STR, progreess  I spent 95 minutes providing this consultation,  Start at 10:30am. More than 50% of the time in this consultation was spent coordinating communication.   HISTORY OF PRESENT ILLNESS:  Sarah Horne is a 64 y.o. 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 Hospitalized from 06/24/2020 to 06/27/2020 for weakness, Significant leukocytosis in the setting of metastatic breast cancer, WBC of 70.4, improving.  CT chest and CT abdomen does not show any sources of  infection.Oncology believes this is related from chemotherapy bcx so far negative.Metastatic breast cancer to brain/lung/liver CT chest and CT abdomen it seems patient has worsening progression of her metastatic disease, some poorly defined lucent lesions in her pelvic T12 vertebral body that is likely metastatic. Oncology following. Recommended discontinue of Gemcitabine. Weakness as she was d/c to STR. Sarah Horne is currently at Short-Term Rehab at [facility] following a hospitalization. Sarah Horne has been at [facility] since yesterday and previously followed as a home palliative care patient for which she is known to this provider. Staff endorses Sarah. Horne has been unhappy since she has been transferred to [facility] from [facility]. Staff endorses Sarah Horne has been complaining about not having a TV in the room, a phone, adequate staffing responses to her request, as she is requesting to be transferred to another rehab facility. Sarah. Horne has required assistance with ADLs including toileting. Sarah Horne is able to feed herself. At present Sarah Horne is lying in bed, appears with generalized weakness so comfortable. No visitors present. I visited and observed Sarah Horne. Sarah Horne called and asked this provider to see her at [facility] in order obtained. Sarah. Horne and I talked about purpose of palliative care visit. We talked recent hospitalization. Sarah Horne talked about her recent transfer to [facility] for which she is unhappy. Sarah Horne endorses she wants to be transferred to [facility] where she previously was at rehab. We talked about the reasons behind why she was unhappy. Sarah Horne DON did come in and talk with her. Sarah Horne endorses that she did not have a TV in the room, no phone. She was upset with the way the food tasted. Sarah Horne endorses she was there for rehab they have not seen her as of yet.  Explained  to Sarah Horne when someone comes as she did yesterday evening that it is typically today when they do the rehab screening and go over the plan. Sarah Horne endorses she does not even have information about what her routine would be like at [facility]. Explained to Sarah. Horne she does have a jump-start meeting happening shortly for which she requested this provider to be present for with Education officer, museum. We talked about her residing at home by herself. Sarah Horne endorse is she fully understands that she needs to be in a Rehab center for strengthening, balance. We talked about Sarah Horne previously being at [facility] the last time she had to be at rehab. Discussed with Sarah Horne to share with her Social Horne at [facility] at jump start meeting her concerns. Sarah Horne in agreement. Sarah. Horne was cooperative with assessment. We talked about symptoms for which she is currently comfortable. We talked about her appetite and things that she likes.This provider did participate in [place] with Sarah Danyal, [facility] Social Horne. Talked with Sarah Horne about her concerns and her wishes to transfer to [facility]. [name] endorse she will put in for a transfer per Sarah. Horne wishes. We did talk with Sarah Horne about the importance of working with therapy when they came in today as she will be at [facility] likely through the weekend as transfers are challenging to do on a Friday afternoon. Sarah Horne in agreement. We talked about other things that would be helpful. Sarah. Horne and I talked about follow-up palliative care visit in one week if needed her sooner she declined. Sarah Horne in agreement. Contact information provided, questions answered to satisfaction. I updated nursing staff no new changes that present time. Medical goals reviewed.  Palliative Care was asked to help to continue to address goals of care.   CODE STATUS: DNR  PPS:  40% HOSPICE ELIGIBILITY/DIAGNOSIS: TBD  PAST MEDICAL HISTORY:  Past Medical History:  Diagnosis Date  . Chemotherapy-induced peripheral neuropathy ([place])   . Epistaxis    a. 11/2016 in setting of asa/plavix-->silver nitrate cauterization.  . GI bleed    a. 11/2016 Admission w/ GIB and hypovolemic shock req 3u PRBC's;  b. 11/2016 ECG: gastritis & nonbleeding peptic ulcer; c. 11/2016 Conlonoscopy: rectal and sigmoid colonic ulcers.  . Heart attack ([place])    a. 1998 Cath @ [facility]: reportedly no intervention required.  Marland Kitchen Herceptin-induced cardiomyopathy ([place])    a. In the setting of Herceptin Rx for breast cancer (initiated 12/2014); b. 03/2015 MUGA EF 64%; b. 08/2015 MUGA: EF 51%; c. 10/2015 MUGA: EF 44%; d. 11/2015 Echo: EF 45-50%; e. 01/2016 MUGA: EF 60%; f. 06/2016 MUGA EF 65%; g. 10/2016 MUGA: EF 61%;  h. 12/2016 Echo: EF 55-60%, gr1 DD.  Marland Kitchen Hyperlipidemia   . Hypertension   . Neuropathy   . Possible PAD (peripheral artery disease) ([place])    a. 11/2016 LE cyanosis and weak pulses-->CTA w/o significant Ao-BiFem dzs. ? distal dzs-->ASA/Plavix initiated by vascular surgery.  Marland Kitchen PSVT (paroxysmal supraventricular tachycardia) ([place])    a. Dx 11/2016.  . Pulmonary embolism ([place])    a. 12/2016 CTA Chest: small nonocclusive PE in inferior segment of the Left lingula, somewhat eccentric filling defect suggesting chronic rather than acute embolic event; b. 03/5448 LE U/S:  No DVT; c. 12/2016 Echo: Nl RV fxn, nl PASP.  Marland Kitchen Recurrent Metastatic breast cancer ([place])    a. Dx 2016: Stage II,  ER positive, PR positive, HER-2/neu overexpressing of the left breast-->chemo/radiation; b. 10/2016 CT Abd/pelvis: diffuse liver mets, ill defined sclerotic bone lesions-T12;  c. 10/2016 MRI brain: metastatic lesion along L temporal lobe (19x26m) w/ extensive surrounding edema & 543mmidline shift to right.  . Sepsis ([place]  . Sinus tachycardia     SOCIAL HX:  Social History   Tobacco Use  . Smoking status: Light Tobacco Smoker     Packs/day: 0.25    Years: 18.00    Pack years: 4.50    Types: Cigarettes  . Smokeless tobacco: Never Used  Substance Use Topics  . Alcohol use: No    Alcohol/week: 0.0 standard drinks    ALLERGIES:  Allergies  Allergen Reactions  . No Known Allergies      PERTINENT MEDICATIONS:  Outpatient Encounter Medications as of 06/28/2020  Medication Sig  . ELIQUIS 5 MG TABS tablet TAKE 1 TABLET BY MOUTH TWICE A DAY  . flecainide (TAMBOCOR) 50 MG tablet Take 1 tablet (50 mg total) by mouth 2 (two) times daily.  . Marland KitchenevETIRAcetam (KEPPRA) 250 MG tablet Take 1 tablet by mouth 2 (two) times daily.   . Marland Kitchenevocetirizine (XYZAL) 5 MG tablet Take 1 tablet by mouth daily.  . magnesium oxide (MAG-OX) 400 MG tablet TAKE 1 TABLET BY MOUTH TWICE A DAY  . meclizine (ANTIVERT) 25 MG tablet Take 1 tablet (25 mg total) by mouth 3 (three) times daily as needed for dizziness. (Patient not taking: Reported on 06/24/2020)  . metoprolol tartrate (LOPRESSOR) 25 MG tablet TAKE 1 TABLET BY MOUTH TWICE A DAY  . montelukast (SINGULAIR) 10 MG tablet TAKE 1 TABLET BY MOUTH EVERYDAY AT BEDTIME  . ondansetron (ZOFRAN) 8 MG tablet One pill every 8 hours as needed for nausea/vomitting. (Patient not taking: Reported on 06/24/2020)  . pregabalin (LYRICA) 100 MG capsule TAKE 1 CAPSULE BY MOUTH TWICE A DAY  . prochlorperazine (COMPAZINE) 10 MG tablet Take 1 tablet (10 mg total) by mouth every 6 (six) hours as needed for nausea or vomiting. (Patient not taking: Reported on 06/24/2020)  . Vitamin D, Ergocalciferol, (DRISDOL) 1.25 MG (50000 UNIT) CAPS capsule TAKE 1 CAPSULE (50,000 UNITS TOTAL) ONCE A WEEK BY MOUTH.   Facility-Administered Encounter Medications as of 06/28/2020  Medication  . 0.9 %  sodium chloride infusion  . 0.9 %  sodium chloride infusion  . 0.9 %  sodium chloride infusion  . heparin lock flush 100 unit/mL  . sodium chloride 0.9 % injection 10 mL  . sodium chloride flush (NS) 0.9 % injection 10 mL    PHYSICAL  EXAM:   General: pleasant female  Cardiovascular: regular rate and rhythm Pulmonary: clear ant fields Neurological: generalized weakness  Tamaiya Bump Z Ihor GullyNP

## 2020-06-29 LAB — CULTURE, BLOOD (ROUTINE X 2): Culture: NO GROWTH

## 2020-06-30 LAB — CULTURE, BLOOD (ROUTINE X 2): Culture: NO GROWTH

## 2020-07-02 DIAGNOSIS — D72829 Elevated white blood cell count, unspecified: Secondary | ICD-10-CM | POA: Diagnosis not present

## 2020-07-02 DIAGNOSIS — Z170 Estrogen receptor positive status [ER+]: Secondary | ICD-10-CM | POA: Diagnosis not present

## 2020-07-02 DIAGNOSIS — C50412 Malignant neoplasm of upper-outer quadrant of left female breast: Secondary | ICD-10-CM | POA: Diagnosis not present

## 2020-07-02 DIAGNOSIS — R531 Weakness: Secondary | ICD-10-CM | POA: Diagnosis not present

## 2020-07-03 ENCOUNTER — Other Ambulatory Visit: Payer: Self-pay

## 2020-07-03 ENCOUNTER — Ambulatory Visit
Admission: RE | Admit: 2020-07-03 | Discharge: 2020-07-03 | Disposition: A | Discharge status: Home / Self Care | Payer: Medicare HMO | Source: Ambulatory Visit | Attending: Internal Medicine | Admitting: Internal Medicine

## 2020-07-03 DIAGNOSIS — Z170 Estrogen receptor positive status [ER+]: Secondary | ICD-10-CM

## 2020-07-03 DIAGNOSIS — C50412 Malignant neoplasm of upper-outer quadrant of left female breast: Secondary | ICD-10-CM

## 2020-07-04 ENCOUNTER — Inpatient Hospital Stay: Payer: Medicare HMO

## 2020-07-04 ENCOUNTER — Encounter: Payer: Self-pay | Admitting: Internal Medicine

## 2020-07-04 ENCOUNTER — Inpatient Hospital Stay: Payer: Medicare HMO | Attending: Internal Medicine

## 2020-07-04 ENCOUNTER — Inpatient Hospital Stay (HOSPITAL_BASED_OUTPATIENT_CLINIC_OR_DEPARTMENT_OTHER): Payer: Medicare HMO | Admitting: Internal Medicine

## 2020-07-04 DIAGNOSIS — C7931 Secondary malignant neoplasm of brain: Secondary | ICD-10-CM | POA: Insufficient documentation

## 2020-07-04 DIAGNOSIS — Z170 Estrogen receptor positive status [ER+]: Secondary | ICD-10-CM | POA: Insufficient documentation

## 2020-07-04 DIAGNOSIS — Z7902 Long term (current) use of antithrombotics/antiplatelets: Secondary | ICD-10-CM | POA: Insufficient documentation

## 2020-07-04 DIAGNOSIS — C7800 Secondary malignant neoplasm of unspecified lung: Secondary | ICD-10-CM | POA: Insufficient documentation

## 2020-07-04 DIAGNOSIS — I10 Essential (primary) hypertension: Secondary | ICD-10-CM | POA: Insufficient documentation

## 2020-07-04 DIAGNOSIS — I429 Cardiomyopathy, unspecified: Secondary | ICD-10-CM | POA: Insufficient documentation

## 2020-07-04 DIAGNOSIS — Z808 Family history of malignant neoplasm of other organs or systems: Secondary | ICD-10-CM | POA: Insufficient documentation

## 2020-07-04 DIAGNOSIS — Z79899 Other long term (current) drug therapy: Secondary | ICD-10-CM | POA: Insufficient documentation

## 2020-07-04 DIAGNOSIS — Z86718 Personal history of other venous thrombosis and embolism: Secondary | ICD-10-CM | POA: Insufficient documentation

## 2020-07-04 DIAGNOSIS — C787 Secondary malignant neoplasm of liver and intrahepatic bile duct: Secondary | ICD-10-CM | POA: Diagnosis not present

## 2020-07-04 DIAGNOSIS — I252 Old myocardial infarction: Secondary | ICD-10-CM | POA: Insufficient documentation

## 2020-07-04 DIAGNOSIS — Z8249 Family history of ischemic heart disease and other diseases of the circulatory system: Secondary | ICD-10-CM | POA: Diagnosis not present

## 2020-07-04 DIAGNOSIS — Z5189 Encounter for other specified aftercare: Secondary | ICD-10-CM | POA: Diagnosis not present

## 2020-07-04 DIAGNOSIS — F17210 Nicotine dependence, cigarettes, uncomplicated: Secondary | ICD-10-CM | POA: Insufficient documentation

## 2020-07-04 DIAGNOSIS — C50412 Malignant neoplasm of upper-outer quadrant of left female breast: Secondary | ICD-10-CM

## 2020-07-04 DIAGNOSIS — E785 Hyperlipidemia, unspecified: Secondary | ICD-10-CM | POA: Diagnosis not present

## 2020-07-04 DIAGNOSIS — Z7901 Long term (current) use of anticoagulants: Secondary | ICD-10-CM | POA: Diagnosis not present

## 2020-07-04 DIAGNOSIS — Z803 Family history of malignant neoplasm of breast: Secondary | ICD-10-CM | POA: Diagnosis not present

## 2020-07-04 DIAGNOSIS — Z86711 Personal history of pulmonary embolism: Secondary | ICD-10-CM | POA: Insufficient documentation

## 2020-07-04 DIAGNOSIS — Z833 Family history of diabetes mellitus: Secondary | ICD-10-CM | POA: Insufficient documentation

## 2020-07-04 DIAGNOSIS — Z7189 Other specified counseling: Secondary | ICD-10-CM

## 2020-07-04 DIAGNOSIS — Z5111 Encounter for antineoplastic chemotherapy: Secondary | ICD-10-CM | POA: Diagnosis not present

## 2020-07-04 LAB — COMPREHENSIVE METABOLIC PANEL
ALT: 62 U/L — ABNORMAL HIGH (ref 0–44)
AST: 92 U/L — ABNORMAL HIGH (ref 15–41)
Albumin: 3.6 g/dL (ref 3.5–5.0)
Alkaline Phosphatase: 476 U/L — ABNORMAL HIGH (ref 38–126)
Anion gap: 9 (ref 5–15)
BUN: 15 mg/dL (ref 8–23)
CO2: 29 mmol/L (ref 22–32)
Calcium: 9.2 mg/dL (ref 8.9–10.3)
Chloride: 102 mmol/L (ref 98–111)
Creatinine, Ser: 0.74 mg/dL (ref 0.44–1.00)
GFR calc non Af Amer: >60 mL/min (ref >60)
Glucose, Bld: 82 mg/dL (ref 70–99)
Potassium: 4.2 mmol/L (ref 3.5–5.1)
Sodium: 140 mmol/L (ref 135–145)
Total Bilirubin: 1.3 mg/dL — ABNORMAL HIGH (ref 0.3–1.2)
Total Protein: 6.9 g/dL (ref 6.5–8.1)

## 2020-07-04 LAB — CBC WITH DIFFERENTIAL/PLATELET
Abs Immature Granulocytes: 0.04 10*3/uL (ref 0.00–0.07)
Basophils Absolute: 0 10*3/uL (ref 0.0–0.1)
Basophils Relative: 0 %
Eosinophils Absolute: 0.1 10*3/uL (ref 0.0–0.5)
Eosinophils Relative: 1 %
HCT: 40.4 % (ref 36.0–46.0)
Hemoglobin: 14 g/dL (ref 12.0–15.0)
Immature Granulocytes: 1 %
Lymphocytes Relative: 10 %
Lymphs Abs: 0.8 10*3/uL (ref 0.7–4.0)
MCH: 32.9 pg (ref 26.0–34.0)
MCHC: 34.7 g/dL (ref 30.0–36.0)
MCV: 94.8 fL (ref 80.0–100.0)
Monocytes Absolute: 0.8 10*3/uL (ref 0.1–1.0)
Monocytes Relative: 10 %
Neutro Abs: 6.5 10*3/uL (ref 1.7–7.7)
Neutrophils Relative %: 78 %
Platelets: 147 10*3/uL — ABNORMAL LOW (ref 150–400)
RBC: 4.26 MIL/uL (ref 3.87–5.11)
RDW: 15.9 % — ABNORMAL HIGH (ref 11.5–15.5)
WBC: 8.3 10*3/uL (ref 4.0–10.5)
nRBC: 0 % (ref 0.0–0.2)

## 2020-07-04 MED ORDER — PEGFILGRASTIM 6 MG/0.6ML ~~LOC~~ PSKT
6.0000 mg | PREFILLED_SYRINGE | Freq: Once | SUBCUTANEOUS | Status: AC
  Administered 2020-07-04: 6 mg via SUBCUTANEOUS
  Filled 2020-07-04: qty 0.6

## 2020-07-04 MED ORDER — HEPARIN SOD (PORK) LOCK FLUSH 100 UNIT/ML IV SOLN
500.0000 [IU] | Freq: Once | INTRAVENOUS | Status: AC
  Administered 2020-07-04: 500 [IU] via INTRAVENOUS
  Filled 2020-07-04: qty 5

## 2020-07-04 MED ORDER — SODIUM CHLORIDE 0.9 % IV SOLN
10.0000 mg | Freq: Once | INTRAVENOUS | Status: AC
  Administered 2020-07-04: 10 mg via INTRAVENOUS
  Filled 2020-07-04: qty 10

## 2020-07-04 MED ORDER — SODIUM CHLORIDE 0.9% FLUSH
10.0000 mL | Freq: Once | INTRAVENOUS | Status: AC
  Administered 2020-07-04: 10 mL via INTRAVENOUS
  Filled 2020-07-04: qty 10

## 2020-07-04 MED ORDER — DEXTROSE 5 % IV SOLN
Freq: Once | INTRAVENOUS | Status: AC
  Filled 2020-07-04: qty 250

## 2020-07-04 MED ORDER — SODIUM CHLORIDE 0.9% FLUSH
10.0000 mL | INTRAVENOUS | Status: DC | PRN
  Filled 2020-07-04: qty 10

## 2020-07-04 MED ORDER — DOXORUBICIN HCL LIPOSOMAL CHEMO INJECTION 2 MG/ML
35.0000 mg/m2 | Freq: Once | INTRAVENOUS | Status: AC
  Administered 2020-07-04: 60 mg via INTRAVENOUS
  Filled 2020-07-04: qty 30

## 2020-07-04 MED ORDER — HEPARIN SOD (PORK) LOCK FLUSH 100 UNIT/ML IV SOLN
500.0000 [IU] | Freq: Once | INTRAVENOUS | Status: DC | PRN
  Filled 2020-07-04: qty 5

## 2020-07-04 MED ORDER — HEPARIN SOD (PORK) LOCK FLUSH 100 UNIT/ML IV SOLN
INTRAVENOUS | Status: AC
  Filled 2020-07-04: qty 5

## 2020-07-04 NOTE — Progress Notes (Signed)
[place] OFFICE PROGRESS NOTE  Patient Care Team: Delsa Grana, PA-C as PCP - General (Family Medicine) Wellington Hampshire, MD as PCP - Cardiology (Cardiology) Bary Castilla, Forest Gleason, MD (General Surgery) Vladimir Crofts, MD as Consulting Physician (Neurology) Cammie Sickle, MD as Consulting Physician (Internal Medicine)  Cancer Staging No matching staging information was found for the patient.   Oncology History Overview Note  # LEFT BREAST IDC; STAGE II [cT2N1] ER >90%; PR- 50-90%; her 2 NEU- POS; s/p Neoadj chemo; AUG 2016- TCH+P s/p Lumpec & partial ALND- path CR; s/p RT [finished Nov 2016];  adj Herceptin; HELD for Jan 19th 2017 [in DEC 2016-EF dropped from 63 to 51%; FEB 27th EF-42.9%]; JAN 2016 START Arimidex; May 2017- EF- 60%; May 24th 2017-Re-start Herceptin q 3W; July 28th STOP herceptin [finished 48m/m2; sec to Low EF; however 2017 Oct EF= 67%; improved]  # DEC 4th 2017- Start Neratinib 4 pills; DEC 11th 3 pills; STOPPED.  # FEB 2018- RECURRENCE ER/PR positive; Her 2 NEGATIVE [liver Bx]  # MARCH 1st 2018- Tax-Cytoxan [poor tol]  # FEB 2018- Brain mets [SBRT; Dr.Crystal; finished Feb 28th 2018]  # March 2018- Eribulin- poor tolerance  # June 8th 2018- Started Faslodex + Abema [abema-multiple interruptions sec to neutropenia]  # MRI Brain- March 2019- Leptomeningeal mets [WBRT; No LP s/p WBRT- 01/09/2018]; STOP Abema [567mday-sec to severe cytopenia]+faslodex  # MAY 20tth 019- Start Xeloda; STOPPED in Nov 22nd 2019- sec to Progressive liver lesions on CT scan  # NOV 26th 2019- Faslodex + Piqaray 25052m; Jan 2020- Piqray 300 mg+ faslodex; FEB 17th CT scan- mixed response/ Overall progression; STOP Piqray + faslodex.   # FEB 25th 2020- ERIBULIN; Poor tolerance sec to severe neutropenia.  Stopped May 2020.  CT scan May 2020-new/progressive 2 cm liver lesion;   #July nd 2020-Ixempra x5 cycles-; NOV 2020-MRI liver- worsening liver lesions [rising  AST/ALT].   # NOV [id number] weekly [consent-]; December 31-2020-progression/rising LFTs.  Stop Taxol  #January 15th, 2021-Adriamycin weekly [C]; MARCH 2021- Partial response. STOP ADRIAMYCIN sec to EF drop 40-45%.   # April 2nd-2021-gemcitabine; 01/15/2020-GEM [800 mg/m2; #3 cycle 1000m13m] q 2 W; with onpro.  September 2021-CT scan progressive disease; discontinue gemcitabine.  September 2020 one 2D echo-50 to 55% ejection fraction  #July 04, 2020-Doxil [35 mg meter square]; on pro; every 4 weeks  #May 06, 2019-MRI brain 5 new lesions [3 mm to 10 mm]-asymptomatic; [previous whole brain radiation]- OCT 2020- Re-Irradiation.   # PN- G-2; may 2017- Cymbalta 60mg75mARCH 2018-  acute vascular insuff of BIL LE [? Taxotere vasoconstriction on asprin]; # hemorragic shock LGIB [s/p colo; Dr.Wohl]  #  SVT- on flecainide.   # April 2016- Liver Bx- NEG;   # Drop in EF from Herceptin [recovered OCt 27th- EF 65%]; March 2020 ejection fraction 40 to 45%.  Stop Adriamycin.  # BMD- jan 2017- osteopenia; #Gait instability/visual disturbance-[Dr.Shah]- S/p EEG [5/13]- negative for seizures.  # 2020- SEP-s/p Pallaitive care evaluation  -----------------------------------------------------------     MOLECULAR TESTING- [facility]- Sep 4th 2018- PDL-1 0%/NEG for BRCA; MSI-Stable; Positive for PI3K; ccnd-1; FGF** -----------------------------------------------------------  Dx: Metastatic Breast cancer [ER/PR-Pos; her -2 NEG] Stage IV; goals: palliative Current treatment-DOXIL  chemotherapy [C]   Carcinoma of upper-outer quadrant of left breast in female, estrogen receptor positive (HCC) [facility]25/2020 - 01/17/2019 Chemotherapy   The patient had eriBULin mesylate (HALAVEN) 2.1 mg in sodium chloride 0.9 % 100 mL chemo infusion, 1.2 mg/m2 = 2.1  mg (100 % of original dose 1.2 mg/m2), Intravenous,  Once, 3 of 4 cycles Dose modification: 1.2 mg/m2 (original dose 1.2 mg/m2, Cycle 1, Reason: Provider  Judgment), 0.8 mg/m2 (original dose 1.2 mg/m2, Cycle 3, Reason: Provider Judgment) Administration: 2.1 mg (11/22/2018), 2.1 mg (11/29/2018), 2.1 mg (12/20/2018), 1.4 mg (01/17/2019)  for chemotherapy treatment.    03/30/2019 - 06/22/2019 Chemotherapy   The patient had pegfilgrastim (NEULASTA ONPRO KIT) injection 6 mg, 6 mg, Subcutaneous, Once, 5 of 8 cycles Administration: 6 mg (03/30/2019), 6 mg (04/20/2019), 6 mg (05/11/2019), 6 mg (06/22/2019), 6 mg (06/01/2019) ixabepilone (IXEMPRA) 60 mg in lactated ringers 250 mL chemo infusion, 33.5 mg/m2 = 57 mg (100 % of original dose 32 mg/m2), Intravenous,  Once, 5 of 8 cycles Dose modification: 32 mg/m2 (original dose 32 mg/m2, Cycle 1, Reason: Provider Judgment) Administration: 72 mg (04/20/2019), 72 mg (05/11/2019), 72 mg (06/22/2019), 72 mg (06/01/2019)  for chemotherapy treatment.    08/18/2019 - 09/15/2019 Chemotherapy   The patient had PACLitaxel (TAXOL) 150 mg in sodium chloride 0.9 % 250 mL chemo infusion (</= 62m/m2), 80 mg/m2 = 150 mg, Intravenous,  Once, 1 of 4 cycles Administration: 150 mg (08/18/2019), 150 mg (09/08/2019), 150 mg (09/15/2019)  for chemotherapy treatment.    10/13/2019 - 11/24/2019 Chemotherapy   The patient had dexamethasone (DECADRON) 4 MG tablet, 8 mg, Oral, Daily, 1 of 1 cycle, Start date: --, End date: -- DOXOrubicin (ADRIAMYCIN) chemo injection 38 mg, 20 mg/m2 = 38 mg, Intravenous,  Once, 6 of 9 cycles Administration: 38 mg (10/13/2019), 38 mg (10/20/2019), 38 mg (10/27/2019), 38 mg (11/17/2019), 38 mg (11/24/2019), 38 mg (11/10/2019) palonosetron (ALOXI) injection 0.25 mg, 0.25 mg, Intravenous,  Once, 6 of 9 cycles Administration: 0.25 mg (10/13/2019), 0.25 mg (10/20/2019), 0.25 mg (10/27/2019), 0.25 mg (11/17/2019), 0.25 mg (11/24/2019), 0.25 mg (11/10/2019)  for chemotherapy treatment.    12/29/2019 - 06/20/2020 Chemotherapy   The patient had pegfilgrastim (NEULASTA ONPRO KIT) injection 6 mg, 6 mg, Subcutaneous, Once, 12 of 18  cycles Administration: 6 mg (01/15/2020), 6 mg (01/29/2020), 6 mg (02/12/2020), 6 mg (02/28/2020), 6 mg (03/14/2020), 6 mg (03/28/2020), 6 mg (04/11/2020), 6 mg (04/25/2020), 6 mg (05/09/2020), 6 mg (05/23/2020), 6 mg (06/06/2020), 6 mg (06/20/2020) gemcitabine (GEMZAR) 1,444 mg in sodium chloride 0.9 % 250 mL chemo infusion, 800 mg/m2 = 1,444 mg, Intravenous,  Once, 13 of 19 cycles Dose modification: 1,000 mg/m2 (original dose 800 mg/m2, Cycle 3, Reason: Provider Judgment) Administration: 1,444 mg (12/29/2019), 1,444 mg (01/15/2020), 1,800 mg (01/29/2020), 1,800 mg (02/12/2020), 1,800 mg (02/28/2020), 1,800 mg (03/14/2020), 1,800 mg (03/28/2020), 1,800 mg (04/11/2020), 1,800 mg (04/25/2020), 1,800 mg (05/09/2020), 1,800 mg (05/23/2020), 1,800 mg (06/06/2020), 1,800 mg (06/20/2020)  for chemotherapy treatment.    07/04/2020 -  Chemotherapy   The patient had pegfilgrastim (NEULASTA ONPRO KIT) injection 6 mg, 6 mg, Subcutaneous, Once, 1 of 4 cycles Administration: 6 mg (07/04/2020) DOXOrubicin HCL LIPOSOMAL (DOXIL) 60 mg in dextrose 5 % 250 mL chemo infusion, 35 mg/m2 = 60 mg (100 % of original dose 35 mg/m2), Intravenous,  Once, 1 of 4 cycles Dose modification: 35 mg/m2 (original dose 35 mg/m2, Cycle 1, Reason: Provider Judgment) Administration: 60 mg (07/04/2020)  for chemotherapy treatment.      INTERVAL HISTORY:  Sarah DEPAOLI89y.[id number].  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  Patient was recently admitted to hospital for generalized weakness; concern for  sepsis with elevated white count of 60,000.  Extensive work-up-for infectious causes was negative.  Patient white count was thought to be from on pro.  Patient had imaging in the hospital that showed progressive disease.   Patient is currently in a rehab; getting physical therapy.  Patient states appetite is fair.  No weight loss but no  falls.   Review of Systems  Constitutional: Positive for malaise/fatigue. Negative for chills, diaphoresis and fever.  HENT: Negative for nosebleeds and sore throat.   Eyes: Negative for double vision.  Respiratory: Negative for cough, hemoptysis, sputum production, shortness of breath and wheezing.   Cardiovascular: Negative for chest pain, palpitations, orthopnea and leg swelling.  Gastrointestinal: Positive for constipation. Negative for abdominal pain, blood in stool, heartburn, melena, nausea and vomiting.  Genitourinary: Negative for dysuria, frequency and urgency.  Musculoskeletal: Positive for joint pain. Negative for back pain.  Skin: Negative for itching.  Neurological: Positive for dizziness and tingling. Negative for focal weakness, weakness and headaches.  Endo/Heme/Allergies: Does not bruise/bleed easily.  Psychiatric/Behavioral: Negative for depression. The patient is not nervous/anxious and does not have insomnia.     PAST MEDICAL HISTORY :  Past Medical History:  Diagnosis Date  . Chemotherapy-induced peripheral neuropathy ([place])   . Epistaxis    a. 11/2016 in setting of asa/plavix-->silver nitrate cauterization.  . GI bleed    a. 11/2016 Admission w/ GIB and hypovolemic shock req 3u PRBC's;  b. 11/2016 ECG: gastritis & nonbleeding peptic ulcer; c. 11/2016 Conlonoscopy: rectal and sigmoid colonic ulcers.  . Heart attack ([place])    a. 1998 Cath @ [facility]: reportedly no intervention required.  Marland Kitchen Herceptin-induced cardiomyopathy ([place])    a. In the setting of Herceptin Rx for breast cancer (initiated 12/2014); b. 03/2015 MUGA EF 64%; b. 08/2015 MUGA: EF 51%; c. 10/2015 MUGA: EF 44%; d. 11/2015 Echo: EF 45-50%; e. 01/2016 MUGA: EF 60%; f. 06/2016 MUGA EF 65%; g. 10/2016 MUGA: EF 61%;  h. 12/2016 Echo: EF 55-60%, gr1 DD.  Marland Kitchen Hyperlipidemia   . Hypertension   . Neuropathy   . Possible PAD (peripheral artery disease) ([place])    a. 11/2016 LE cyanosis and weak pulses-->CTA w/o significant  Ao-BiFem dzs. ? distal dzs-->ASA/Plavix initiated by vascular surgery.  Marland Kitchen PSVT (paroxysmal supraventricular tachycardia) ([place])    a. Dx 11/2016.  . Pulmonary embolism ([place])    a. 12/2016 CTA Chest: small nonocclusive PE in inferior segment of the Left lingula, somewhat eccentric filling defect suggesting chronic rather than acute embolic event; b. 10/4266 LE U/S:  No DVT; c. 12/2016 Echo: Nl RV fxn, nl PASP.  Marland Kitchen Recurrent Metastatic breast cancer ([place])    a. Dx 2016: Stage II, ER positive, PR positive, HER-2/neu overexpressing of the left breast-->chemo/radiation; b. 10/2016 CT Abd/pelvis: diffuse liver mets, ill defined sclerotic bone lesions-T12;  c. 10/2016 MRI brain: metastatic lesion along L temporal lobe (19x47m) w/ extensive surrounding edema & 51mmidline shift to right.  . Sepsis ([place]  . Sinus tachycardia     PAST SURGICAL HISTORY :   Past Surgical History:  Procedure Laterality Date  . BREAST BIOPSY Left 2016   Positive  . BREAST LUMPECTOMY WITH SENTINEL LYMPH NODE BIOPSY Left 05/23/2015   Procedure: LEFT BREAST WIDE EXCISION WITH AXILLARY DISSECTION, MASTOPLASTY ;  Surgeon: JeRobert BellowMD;  Location: [facility] ORS;  Service: General;  Laterality: Left;  . BREAST SURGERY Left 12/18/14   breast biopsy/INVASIVE DUCTAL CARCINOMA OF BREAST, NOTTINGHAM GRADE 2.  . Marland KitchenREAST SURGERY  05/23/2015.   Wide excision/mastoplasty, axillary dissection. No residual invasive cancer, positive for residual DCIS. 0/2 nodes identified on axillary dissection. (no SLN by technetium or methylene blue)  . CARDIAC CATHETERIZATION    . COLONOSCOPY WITH PROPOFOL N/A 12/10/2016   Procedure: COLONOSCOPY WITH PROPOFOL;  Surgeon: Lucilla Lame, MD;  Location: [facility] ENDOSCOPY;  Service: Endoscopy;  Laterality: N/A;  . COLONOSCOPY WITH PROPOFOL N/A 02/13/2017   Procedure: COLONOSCOPY WITH PROPOFOL;  Surgeon: Lucilla Lame, MD;  Location[facility] ENDOSCOPY;  Service: Endoscopy;  Laterality: N/A;  . ESOPHAGOGASTRODUODENOSCOPY N/A  11/03/2019   Procedure: ESOPHAGOGASTRODUODENOSCOPY (EGD);  Surgeon: Lin Landsman, MD;  Location: [facility] ENDOSCOPY;  Service: Gastroenterology;  Laterality: N/A;  . ESOPHAGOGASTRODUODENOSCOPY (EGD) WITH PROPOFOL N/A 12/08/2016   Procedure: ESOPHAGOGASTRODUODENOSCOPY (EGD) WITH PROPOFOL;  Surgeon: Lucilla Lame, MD;  Location: [facility] ENDOSCOPY;  Service: Endoscopy;  Laterality: N/A;  . IVC FILTER INSERTION N/A 02/15/2017   Procedure: IVC Filter Insertion;  Surgeon: Algernon Huxley, MD;  Location: [facility];  Service: Cardiovascular;  Laterality: N/A;  . PORTACATH PLACEMENT Right 12-31-14   Dr Bary Castilla    FAMILY HISTORY :   Family History  Problem Relation Age of Onset  . Breast cancer Maternal Aunt   . Breast cancer Cousin   . Brain cancer Maternal Uncle   . Diabetes Mother   . Hypertension Mother   . Stroke Mother     SOCIAL HISTORY:   Social History   Tobacco Use  . Smoking status: Light Tobacco Smoker    Packs/day: 0.25    Years: 18.00    Pack years: 4.50    Types: Cigarettes  . Smokeless tobacco: Never Used  Vaping Use  . Vaping Use: Never used  Substance Use Topics  . Alcohol use: No    Alcohol/week: 0.0 standard drinks  . Drug use: No    ALLERGIES:  is allergic to no known allergies.  MEDICATIONS:  Current Outpatient Medications  Medication Sig Dispense Refill  . ELIQUIS 5 MG TABS tablet TAKE 1 TABLET BY MOUTH TWICE A DAY 60 tablet 4  . flecainide (TAMBOCOR) 50 MG tablet Take 1 tablet (50 mg total) by mouth 2 (two) times daily. 60 tablet 4  . levETIRAcetam (KEPPRA) 250 MG tablet Take 1 tablet by mouth 2 (two) times daily.     Marland Kitchen levocetirizine (XYZAL) 5 MG tablet Take 1 tablet by mouth daily.    . magnesium oxide (MAG-OX) 400 MG tablet TAKE 1 TABLET BY MOUTH TWICE A DAY 60 tablet 4  . metoprolol tartrate (LOPRESSOR) 25 MG tablet TAKE 1 TABLET BY MOUTH TWICE A DAY 60 tablet 1  . montelukast (SINGULAIR) 10 MG tablet TAKE 1 TABLET BY MOUTH EVERYDAY AT BEDTIME 90  tablet 2  . pregabalin (LYRICA) 100 MG capsule TAKE 1 CAPSULE BY MOUTH TWICE A DAY 60 capsule 2  . Vitamin D, Ergocalciferol, (DRISDOL) 1.25 MG (50000 UNIT) CAPS capsule TAKE 1 CAPSULE (50,000 UNITS TOTAL) ONCE A WEEK BY MOUTH. 12 capsule 1  . meclizine (ANTIVERT) 25 MG tablet Take 1 tablet (25 mg total) by mouth 3 (three) times daily as needed for dizziness. (Patient not taking: Reported on 06/24/2020) 30 tablet 0  . ondansetron (ZOFRAN) 8 MG tablet One pill every 8 hours as needed for nausea/vomitting. (Patient not taking: Reported on 06/24/2020) 40 tablet 1  . prochlorperazine (COMPAZINE) 10 MG tablet Take 1 tablet (10 mg total) by mouth every 6 (six) hours as needed for nausea or vomiting. (Patient not taking: Reported on  06/24/2020) 40 tablet 1   No current facility-administered medications for this visit.   Facility-Administered Medications Ordered in Other Visits  Medication Dose Route Frequency Provider Last Rate Last Admin  . 0.9 %  sodium chloride infusion   Intravenous Continuous [name], MD   New Bag at 12/10/16 1140  . 0.9 %  sodium chloride infusion   Intravenous Continuous Lloyd Huger, MD 999 mL/hr at 04/24/15 1520 New Bag at 05/23/15 0951  . 0.9 %  sodium chloride infusion   Intravenous Continuous Verlon Au, NP 999 mL/hr at 10/31/19 1522 New Bag at 10/31/19 1522  . heparin lock flush 100 unit/mL  500 Units Intravenous Once Berenzon, Dmitriy, MD      . sodium chloride 0.9 % injection 10 mL  10 mL Intravenous PRN [name], MD   10 mL at 04/04/15 1440  . sodium chloride flush (NS) 0.9 % injection 10 mL  10 mL Intravenous PRN Berenzon, Dmitriy, MD        PHYSICAL EXAMINATION: ECOG PERFORMANCE STATUS: 1 - Symptomatic but completely ambulatory  BP 100/76 (BP Location: Right Arm, Patient Position: Sitting, Cuff Size: Normal)   Pulse 84   Temp 97.6 F (36.4 C) (Tympanic)   Resp 16   Ht _0  (1.676 m)   Wt 132 lb (59.9 kg)   SpO2 99%   BMI 21.31 kg/m    Filed Weights   07/04/20 0950  Weight: 132 lb (59.9 kg)    Physical Exam Constitutional:      Comments: Patient is alone.  In wheel chair.   HENT:     Head: Normocephalic and atraumatic.     Mouth/Throat:     Pharynx: No oropharyngeal exudate.  Eyes:     Pupils: Pupils are equal, round, and reactive to light.  Cardiovascular:     Rate and Rhythm: Normal rate and regular rhythm.  Pulmonary:     Effort: Pulmonary effort is normal. No respiratory distress.     Breath sounds: Normal breath sounds. No wheezing.  Abdominal:     General: Bowel sounds are normal. There is no distension.     Palpations: Abdomen is soft. There is no mass.     Tenderness: There is no abdominal tenderness. There is no guarding or rebound.  Musculoskeletal:        General: No tenderness. Normal range of motion.     Cervical back: Normal range of motion and neck supple.  Skin:    General: Skin is warm.     Comments: Bilateral foot plantar calluses noted.  Tender to palpation.  Neurological:     Mental Status: She is alert and oriented to person, place, and time.     Comments: Broad based gait.  Unsteady.  Psychiatric:        Mood and Affect: Affect normal.     LABORATORY DATA:  I have reviewed the data as listed    Component Value Date/Time   NA 140 07/04/2020 0941   NA 135 03/04/2017 0918   NA 135 01/08/2015 0850   K 4.2 07/04/2020 0941   K 3.7 01/08/2015 0850   CL 102 07/04/2020 0941   CL 101 01/08/2015 0850   CO2 29 07/04/2020 0941   CO2 26 01/08/2015 0850   GLUCOSE 82 07/04/2020 0941   GLUCOSE 161 (H) 01/08/2015 0850   BUN 15 07/04/2020 0941   BUN 8 03/04/2017 0918   BUN 17 01/08/2015 0850   CREATININE 0.74 07/04/2020 0941   CREATININE  0.82 01/22/2015 1559   CALCIUM 9.2 07/04/2020 0941   CALCIUM 9.3 01/08/2015 0850   PROT 6.9 07/04/2020 0941   PROT 6.4 03/04/2017 0918   PROT 6.8 01/22/2015 1559   ALBUMIN 3.6 07/04/2020 0941   ALBUMIN 2.5 (L) 03/04/2017 0918   ALBUMIN 3.9  01/22/2015 1559   AST 92 (H) 07/04/2020 0941   AST 34 01/22/2015 1559   ALT 62 (H) 07/04/2020 0941   ALT 42 01/22/2015 1559   ALKPHOS 476 (H) 07/04/2020 0941   ALKPHOS 157 (H) 01/22/2015 1559   BILITOT 1.3 (H) 07/04/2020 0941   BILITOT 6.2 (H) 03/04/2017 0918   BILITOT 0.2 (L) 01/22/2015 1559   GFRNONAA >60 07/04/2020 0941   GFRNONAA >60 01/22/2015 1559   GFRAA >60 06/26/2020 0937   GFRAA >60 01/22/2015 1559    No results found for: SPEP, UPEP  Lab Results  Component Value Date   WBC 8.3 07/04/2020   NEUTROABS 6.5 07/04/2020   HGB 14.0 07/04/2020   HCT 40.4 07/04/2020   MCV 94.8 07/04/2020   PLT 147 (L) 07/04/2020      Chemistry      Component Value Date/Time   NA 140 07/04/2020 0941   NA 135 03/04/2017 0918   NA 135 01/08/2015 0850   K 4.2 07/04/2020 0941   K 3.7 01/08/2015 0850   CL 102 07/04/2020 0941   CL 101 01/08/2015 0850   CO2 29 07/04/2020 0941   CO2 26 01/08/2015 0850   BUN 15 07/04/2020 0941   BUN 8 03/04/2017 0918   BUN 17 01/08/2015 0850   CREATININE 0.74 07/04/2020 0941   CREATININE 0.82 01/22/2015 1559      Component Value Date/Time   CALCIUM 9.2 07/04/2020 0941   CALCIUM 9.3 01/08/2015 0850   ALKPHOS 476 (H) 07/04/2020 0941   ALKPHOS 157 (H) 01/22/2015 1559   AST 92 (H) 07/04/2020 0941   AST 34 01/22/2015 1559   ALT 62 (H) 07/04/2020 0941   ALT 42 01/22/2015 1559   BILITOT 1.3 (H) 07/04/2020 0941   BILITOT 6.2 (H) 03/04/2017 0918   BILITOT 0.2 (L) 01/22/2015 1559       RADIOGRAPHIC STUDIES: I have personally reviewed the radiological images as listed and agreed with the findings in the report. No results found.   ASSESSMENT & PLAN:  Carcinoma of upper-outer quadrant of left breast in female, estrogen receptor positive ([place]) # Recurrent metastatic breast cancer-lung/liver metastases ER PR positive HER-2 negative; on Gemcitabine;  SEP 2021- PROGRESSIVE DISEASE- in liver/lungs.  # Discontinue gemcitabine. plan to start Doxil every 4  weeks [dose reduced at 35 mg/m2]. Discussed treatments are palliative and not curative.  Patient understands response rates are quite modest given the multiple lines of therapies.  And she is also at risk for complications from therapy including but not limited to-hand-foot syndrome; diarrhea; risk of infections/low blood counts; and also worsening cardiomyopathy.  After extensive discussion patient agrees to proceed with treatment.   # Gait instability- worse [Hx of LP disease/RT]-under PT.;  seizures; on keppra- STABLE    # cardiomyopathy/ Sinus tachycardia- on metoprolol-/Tamabcor; [SEP  2d-echo- 50-55%]; STABLE  # Elevated LFTs/alkaline phosphatase 490-likely secondary underlying cirrhosis/ chemo/underlying malignancy- RISING-   # Peripheral neuropathy grade 1-2-  on Lyrica- STABLE  # weight loss/ poor appetite; sec to malignancy/ chemo- STABLE; continue ensure.    # Right lower extremity DVT bilateral [November 28, 2019] CT scan abdomen pelvis negative for any DVT.  On eliquis;  STABLE.   #  DISPOSITION:  # Doxil chemo today; onpro today.  # 2 weeks MD; labs- cbc/cmp IVFs- over 1 hour; Dr.B  # 40 minutes face-to-face with the patient discussing the above plan of care; more than 50% of time spent on prognosis/ natural history; counseling and coordination.      No orders of the defined types were placed in this encounter.  All questions were answered. The patient knows to call the clinic with any problems, questions or concerns.     Cammie Sickle, MD 07/05/2020 1:10 PM

## 2020-07-04 NOTE — Progress Notes (Addendum)
Nutrition Follow-up:  Patient with metastatic breast cancer.  Noted recent hospital admission for weakness.  Patient has been discharged to [facility] for rehab.    Met with patient during infusion.  Patient states that she does not have much of an appetite.  Does not like the food at rehab.  States that she has been working with PT to get stronger and get home.  Has not been receiving oral nutrition supplements at rehab. Has not eaten anything today so far.    Medications: reviewed  Labs: reviewed  Anthropometrics:   Weight 132 lb today in clinic decreased from 136 lb 3.2 oz on 9/9  138 lb  on 8/3   NUTRITION DIAGNOSIS: Inadequate oral intake continues   INTERVENTION:  Patient agreeable to RD reaching out to [facility] RD to coordinate nutrition efforts.  Called and left message for RD to return call.  RD only works Tuesday and Fridays. Stressed importance of good nutrition as one of patient's goals is to improve strength and return home.  Complimentary case of ensure enlive given to patient today to take back to rehab.       MONITORING, EVALUATION, GOAL: weight trends, intake   NEXT VISIT: Nov 4 phone f/u  Jackelynn Hosie B. Zenia Resides, [place], [place] Registered Dietitian [phone] (mobile)

## 2020-07-04 NOTE — Assessment & Plan Note (Addendum)
#  Recurrent metastatic breast cancer-lung/liver metastases ER PR positive HER-2 negative; on Gemcitabine;  SEP 2021- PROGRESSIVE DISEASE- in liver/lungs.  # Discontinue gemcitabine. plan to start Doxil every 4 weeks [dose reduced at 35 mg/m2]. Discussed treatments are palliative and not curative.  Patient understands response rates are quite modest given the multiple lines of therapies.  And she is also at risk for complications from therapy including but not limited to-hand-foot syndrome; diarrhea; risk of infections/low blood counts; and also worsening cardiomyopathy.  After extensive discussion patient agrees to proceed with treatment.   # Gait instability- worse [Hx of LP disease/RT]-under PT.;  seizures; on keppra- STABLE    # cardiomyopathy/ Sinus tachycardia- on metoprolol-/Tamabcor; [SEP  2d-echo- 50-55%]; STABLE  # Elevated LFTs/alkaline phosphatase 490-likely secondary underlying cirrhosis/ chemo/underlying malignancy- RISING-   # Peripheral neuropathy grade 1-2-  on Lyrica- STABLE  # weight loss/ poor appetite; sec to malignancy/ chemo- STABLE; continue ensure.    # Right lower extremity DVT bilateral [November 28, 2019] CT scan abdomen pelvis negative for any DVT.  On eliquis;  STABLE.   # DISPOSITION:  # Doxil chemo today; onpro today.  # 2 weeks MD; labs- cbc/cmp IVFs- over 1 hour; Dr.B  # 40 minutes face-to-face with the patient discussing the above plan of care; more than 50% of time spent on prognosis/ natural history; counseling and coordination.

## 2020-07-05 ENCOUNTER — Ambulatory Visit: Admission: RE | Admit: 2020-07-05 | Payer: Medicare HMO | Source: Ambulatory Visit

## 2020-07-05 ENCOUNTER — Telehealth: Payer: Self-pay | Admitting: Internal Medicine

## 2020-07-05 NOTE — Telephone Encounter (Signed)
On 10/08-spoke to patient's brother regarding patient's progressive malignancy.  Discussed that she is a high risk of complications from chemotherapy given her borderline performance status; also modest benefit at best.  Discussed that we will monitor closely.  Follow-up as planned.

## 2020-07-07 DIAGNOSIS — M25571 Pain in right ankle and joints of right foot: Secondary | ICD-10-CM | POA: Diagnosis not present

## 2020-07-07 DIAGNOSIS — R2241 Localized swelling, mass and lump, right lower limb: Secondary | ICD-10-CM | POA: Diagnosis not present

## 2020-07-09 ENCOUNTER — Other Ambulatory Visit: Payer: Self-pay | Admitting: Internal Medicine

## 2020-07-09 NOTE — Telephone Encounter (Signed)
Dr. Jacinto Reap - Do you want to decline the auto RF since patient is at [facility].

## 2020-07-11 ENCOUNTER — Other Ambulatory Visit: Payer: Self-pay

## 2020-07-11 ENCOUNTER — Other Ambulatory Visit: Payer: Medicare HMO | Admitting: Nurse Practitioner

## 2020-07-12 ENCOUNTER — Non-Acute Institutional Stay: Payer: Medicare HMO | Admitting: Nurse Practitioner

## 2020-07-12 ENCOUNTER — Encounter: Payer: Self-pay | Admitting: Nurse Practitioner

## 2020-07-12 ENCOUNTER — Other Ambulatory Visit: Payer: Self-pay

## 2020-07-12 DIAGNOSIS — C50919 Malignant neoplasm of unspecified site of unspecified female breast: Secondary | ICD-10-CM | POA: Diagnosis not present

## 2020-07-12 DIAGNOSIS — Z515 Encounter for palliative care: Secondary | ICD-10-CM

## 2020-07-12 NOTE — Addendum Note (Signed)
Addended by: Shawn Stall on: 07/12/2020 03:07 PM   Modules accepted: Level of Service

## 2020-07-12 NOTE — Progress Notes (Signed)
[facility] Consult Note Telephone: [phone]  Fax: [phone]  PATIENT NAME: Sarah Horne DOB: [birth year] MRN: [id number]  PRIMARY CARE PROVIDER:   Laurell Roof  REFERRING PROVIDER:  Dr Hodges/[facility] RESPONSIBLE PARTY:   self  RECOMMENDATIONS and PLAN: 1.ACP:DNR; MOST form completed in [facility]; DNR/Limited interventions, wishes for antibiotics, IVF, no feeding tube, continue with chemotherapy  2.Palliative care encounter; Palliative medicine team will continue to support patient, patient's family, and medical team. Visit consisted of counseling and education dealing with the complex and emotionally intense issues of symptom management and palliative care in the setting of serious and potentially life-threatening illness  3. F/u 2 week for ongoing monitoring of STR, progreess  I spent 60 minutes providing this consultation,  Start at 10:30am. More than 50% of the time in this consultation was spent coordinating communication.   HISTORY OF PRESENT ILLNESS:  Sarah Horne is a 64 y.o. 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 Hospitalized from 06/24/2020 to 06/27/2020 for weakness, Significant leukocytosis in the setting of metastatic breast cancer, WBC of 70.4, improving. CT chest and CT abdomen does not show any sources of infection.Oncology believes this is  related from chemotherapy bcxso far negative.Metastatic breast cancer to brain/lung/liver CT chest and CT abdomen it seems patient has worsening progression of her metastatic disease, some poorly defined lucent lesions in her pelvic T12 vertebral body that is likely metastatic. Oncology following. Recommended discontinueof Gemcitabine. Sarah Horne continues to reside at [place] at [facility]. Sarah Horne does transfer to the wheelchair. Sarah Horne is working with there being getting stronger. Sarah Horne did have a fall and sprained her ankle. Sarah Horne does require assistance with ADLs, dressing. Sarah. Horne is becoming more independent. Staff endorses Oncology visit she took an [place] to the cancer center for which she arranged herself. Staff endorses appetite remains declined. Staff endorses no other changes she appears more content about having to reside at facility. At present Sarah Horne is sitting in the wheelchair smoking on the smoking patio. Sarah Horne and I talked about purpose of palliative care visit. Sarah Horne in agreement also to do visit and her current location. We talked about symptoms of pain which she endorses she is comfortable. We talked about smoking cessation though she politely declined. We talked about her appetite which has been declined but slowly improving. We talked about the work she has been doing with therapy. Sarah Horne endorses she is not sure how much longer she will have to stay in rehab. Sarah Horne hopes are that she returns back home to where she lives independently with her brother help. We talked about medical goals of care. We talked about continue and treatments. We talked about quality-of-life. We talked about role Palliative care and plan of care. Discuss will continue to follow a monitor during Rehab as well as when returns home to resume home Palliative Care home visits. Sarah Horne in agreement. Therapeutical listening, emotional support provided. Questions  answered to satisfaction. I updated staff.  Palliative Care was asked to help to continue to  address goals of care.   CODE STATUS: DNR  PPS: 40% HOSPICE ELIGIBILITY/DIAGNOSIS: TBD  PAST MEDICAL HISTORY:  Past Medical History:  Diagnosis Date   Chemotherapy-induced peripheral neuropathy ([place])    Epistaxis    a. 11/2016 in setting of asa/plavix-->silver nitrate cauterization.   GI bleed    a. 11/2016 Admission w/ GIB and hypovolemic shock req 3u PRBC's;  b. 11/2016 ECG: gastritis & nonbleeding peptic ulcer; c. 11/2016 Conlonoscopy: rectal and sigmoid colonic ulcers.   Heart attack ([place])    a. 1998 Cath @ [facility]: reportedly no intervention required.   Herceptin-induced cardiomyopathy ([place])    a. In the setting of Herceptin Rx for breast cancer (initiated 12/2014); b. 03/2015 MUGA EF 64%; b. 08/2015 MUGA: EF 51%; c. 10/2015 MUGA: EF 44%; d. 11/2015 Echo: EF 45-50%; e. 01/2016 MUGA: EF 60%; f. 06/2016 MUGA EF 65%; g. 10/2016 MUGA: EF 61%;  h. 12/2016 Echo: EF 55-60%, gr1 DD.   Hyperlipidemia    Hypertension    Neuropathy    Possible PAD (peripheral artery disease) ([place])    a. 11/2016 LE cyanosis and weak pulses-->CTA w/o significant Ao-BiFem dzs. ? distal dzs-->ASA/Plavix initiated by vascular surgery.   PSVT (paroxysmal supraventricular tachycardia) ([place])    a. Dx 11/2016.   Pulmonary embolism ([place])    a. 12/2016 CTA Chest: small nonocclusive PE in inferior segment of the Left lingula, somewhat eccentric filling defect suggesting chronic rather than acute embolic event; b. 02/9793 LE U/S:  No DVT; c. 12/2016 Echo: Nl RV fxn, nl PASP.   Recurrent Metastatic breast cancer ([place])    a. Dx 2016: Stage II, ER positive, PR positive, HER-2/neu overexpressing of the left breast-->chemo/radiation; b. 10/2016 CT Abd/pelvis: diffuse liver mets, ill defined sclerotic bone lesions-T12;  c. 10/2016 MRI brain: metastatic lesion along L temporal lobe (19x18m) w/ extensive surrounding edema & 597mmidline shift to  right.   Sepsis ([place]   Sinus tachycardia     SOCIAL HX:  Social History   Tobacco Use   Smoking status: Light Tobacco Smoker    Packs/day: 0.25    Years: 18.00    Pack years: 4.50    Types: Cigarettes   Smokeless tobacco: Never Used  Substance Use Topics   Alcohol use: No    Alcohol/week: 0.0 standard drinks    ALLERGIES:  Allergies  Allergen Reactions   No Known Allergies      PERTINENT MEDICATIONS:  Outpatient Encounter Medications as of 07/12/2020  Medication Sig   ELIQUIS 5 MG TABS tablet TAKE 1 TABLET BY MOUTH TWICE A DAY   flecainide (TAMBOCOR) 50 MG tablet Take 1 tablet (50 mg total) by mouth 2 (two) times daily.   levETIRAcetam (KEPPRA) 250 MG tablet Take 1 tablet by mouth 2 (two) times daily.    levocetirizine (XYZAL) 5 MG tablet Take 1 tablet by mouth daily.   magnesium oxide (MAG-OX) 400 MG tablet TAKE 1 TABLET BY MOUTH TWICE A DAY   meclizine (ANTIVERT) 25 MG tablet Take 1 tablet (25 mg total) by mouth 3 (three) times daily as needed for dizziness. (Patient not taking: Reported on 06/24/2020)   metoprolol tartrate (LOPRESSOR) 25 MG tablet TAKE 1 TABLET BY MOUTH TWICE A DAY   montelukast (SINGULAIR) 10 MG tablet TAKE 1 TABLET BY MOUTH EVERYDAY AT BEDTIME   ondansetron (ZOFRAN) 8 MG tablet One pill every 8 hours as needed for nausea/vomitting. (Patient not taking: Reported on 06/24/2020)   pregabalin (LYRICA) 100 MG capsule TAKE 1 CAPSULE BY  MOUTH TWICE A DAY   prochlorperazine (COMPAZINE) 10 MG tablet Take 1 tablet (10 mg total) by mouth every 6 (six) hours as needed for nausea or vomiting. (Patient not taking: Reported on 06/24/2020)   Vitamin D, Ergocalciferol, (DRISDOL) 1.25 MG (50000 UNIT) CAPS capsule TAKE 1 CAPSULE (50,000 UNITS TOTAL) ONCE A WEEK BY MOUTH.   Facility-Administered Encounter Medications as of 07/12/2020  Medication   0.9 %  sodium chloride infusion   0.9 %  sodium chloride infusion   0.9 %  sodium chloride infusion    heparin lock flush 100 unit/mL   sodium chloride 0.9 % injection 10 mL   sodium chloride flush (NS) 0.9 % injection 10 mL    PHYSICAL EXAM:   General: NAD, pleasant female Cardiovascular: regular rate and rhythm Pulmonary: clear ant fields Neurological: generalized weakness  Lakeshia Dohner Ihor Gully, NP

## 2020-07-18 ENCOUNTER — Other Ambulatory Visit: Payer: Self-pay

## 2020-07-18 ENCOUNTER — Inpatient Hospital Stay: Payer: Medicare HMO

## 2020-07-18 ENCOUNTER — Inpatient Hospital Stay: Payer: Medicare HMO | Admitting: *Deleted

## 2020-07-18 ENCOUNTER — Inpatient Hospital Stay (HOSPITAL_BASED_OUTPATIENT_CLINIC_OR_DEPARTMENT_OTHER): Payer: Medicare HMO | Admitting: Internal Medicine

## 2020-07-18 DIAGNOSIS — C7931 Secondary malignant neoplasm of brain: Secondary | ICD-10-CM | POA: Diagnosis not present

## 2020-07-18 DIAGNOSIS — Z95828 Presence of other vascular implants and grafts: Secondary | ICD-10-CM

## 2020-07-18 DIAGNOSIS — Z170 Estrogen receptor positive status [ER+]: Secondary | ICD-10-CM | POA: Diagnosis not present

## 2020-07-18 DIAGNOSIS — C50412 Malignant neoplasm of upper-outer quadrant of left female breast: Secondary | ICD-10-CM | POA: Diagnosis not present

## 2020-07-18 DIAGNOSIS — C787 Secondary malignant neoplasm of liver and intrahepatic bile duct: Secondary | ICD-10-CM | POA: Diagnosis not present

## 2020-07-18 DIAGNOSIS — C7800 Secondary malignant neoplasm of unspecified lung: Secondary | ICD-10-CM | POA: Diagnosis not present

## 2020-07-18 DIAGNOSIS — Z86718 Personal history of other venous thrombosis and embolism: Secondary | ICD-10-CM | POA: Diagnosis not present

## 2020-07-18 DIAGNOSIS — Z5111 Encounter for antineoplastic chemotherapy: Secondary | ICD-10-CM | POA: Diagnosis not present

## 2020-07-18 DIAGNOSIS — Z5189 Encounter for other specified aftercare: Secondary | ICD-10-CM | POA: Diagnosis not present

## 2020-07-18 DIAGNOSIS — Z86711 Personal history of pulmonary embolism: Secondary | ICD-10-CM | POA: Diagnosis not present

## 2020-07-18 LAB — CBC WITH DIFFERENTIAL/PLATELET
Abs Immature Granulocytes: 0.02 10*3/uL (ref 0.00–0.07)
Basophils Absolute: 0 10*3/uL (ref 0.0–0.1)
Basophils Relative: 0 %
Eosinophils Absolute: 0 10*3/uL (ref 0.0–0.5)
Eosinophils Relative: 1 %
HCT: 38.6 % (ref 36.0–46.0)
Hemoglobin: 13.5 g/dL (ref 12.0–15.0)
Immature Granulocytes: 1 %
Lymphocytes Relative: 12 %
Lymphs Abs: 0.5 10*3/uL — ABNORMAL LOW (ref 0.7–4.0)
MCH: 33.1 pg (ref 26.0–34.0)
MCHC: 35 g/dL (ref 30.0–36.0)
MCV: 94.6 fL (ref 80.0–100.0)
Monocytes Absolute: 0.2 10*3/uL (ref 0.1–1.0)
Monocytes Relative: 4 %
Neutro Abs: 3.6 10*3/uL (ref 1.7–7.7)
Neutrophils Relative %: 82 %
Platelets: 234 10*3/uL (ref 150–400)
RBC: 4.08 MIL/uL (ref 3.87–5.11)
RDW: 16.3 % — ABNORMAL HIGH (ref 11.5–15.5)
WBC: 4.3 10*3/uL (ref 4.0–10.5)
nRBC: 0 % (ref 0.0–0.2)

## 2020-07-18 LAB — COMPREHENSIVE METABOLIC PANEL
ALT: 73 U/L — ABNORMAL HIGH (ref 0–44)
AST: 112 U/L — ABNORMAL HIGH (ref 15–41)
Albumin: 3.5 g/dL (ref 3.5–5.0)
Alkaline Phosphatase: 556 U/L — ABNORMAL HIGH (ref 38–126)
Anion gap: 9 (ref 5–15)
BUN: 17 mg/dL (ref 8–23)
CO2: 29 mmol/L (ref 22–32)
Calcium: 8.7 mg/dL — ABNORMAL LOW (ref 8.9–10.3)
Chloride: 101 mmol/L (ref 98–111)
Creatinine, Ser: 0.57 mg/dL (ref 0.44–1.00)
GFR, Estimated: >60 mL/min (ref >60)
Glucose, Bld: 87 mg/dL (ref 70–99)
Potassium: 3.9 mmol/L (ref 3.5–5.1)
Sodium: 139 mmol/L (ref 135–145)
Total Bilirubin: 1.7 mg/dL — ABNORMAL HIGH (ref 0.3–1.2)
Total Protein: 7 g/dL (ref 6.5–8.1)

## 2020-07-18 MED ORDER — HEPARIN SOD (PORK) LOCK FLUSH 100 UNIT/ML IV SOLN
500.0000 [IU] | Freq: Once | INTRAVENOUS | Status: AC
  Administered 2020-07-18: 500 [IU] via INTRAVENOUS
  Filled 2020-07-18: qty 5

## 2020-07-18 MED ORDER — SODIUM CHLORIDE 0.9% FLUSH
10.0000 mL | Freq: Once | INTRAVENOUS | Status: AC
  Administered 2020-07-18: 10 mL via INTRAVENOUS
  Filled 2020-07-18: qty 10

## 2020-07-18 NOTE — Progress Notes (Signed)
[place] OFFICE PROGRESS NOTE  Patient Care Team: Delsa Grana, PA-C as PCP - General (Family Medicine) Wellington Hampshire, MD as PCP - Cardiology (Cardiology) Bary Castilla, Forest Gleason, MD (General Surgery) Vladimir Crofts, MD as Consulting Physician (Neurology) Cammie Sickle, MD as Consulting Physician (Internal Medicine)  Cancer Staging No matching staging information was found for the patient.   Oncology History Overview Note  # LEFT BREAST IDC; STAGE II [cT2N1] ER >90%; PR- 50-90%; her 2 NEU- POS; s/p Neoadj chemo; AUG 2016- TCH+P s/p Lumpec & partial ALND- path CR; s/p RT [finished Nov 2016];  adj Herceptin; HELD for Jan 19th 2017 [in DEC 2016-EF dropped from 63 to 51%; FEB 27th EF-42.9%]; JAN 2016 START Arimidex; May 2017- EF- 60%; May 24th 2017-Re-start Herceptin q 3W; July 28th STOP herceptin [finished 48m/m2; sec to Low EF; however 2017 Oct EF= 67%; improved]  # DEC 4th 2017- Start Neratinib 4 pills; DEC 11th 3 pills; STOPPED.  # FEB 2018- RECURRENCE ER/PR positive; Her 2 NEGATIVE [liver Bx]  # MARCH 1st 2018- Tax-Cytoxan [poor tol]  # FEB 2018- Brain mets [SBRT; Dr.Crystal; finished Feb 28th 2018]  # March 2018- Eribulin- poor tolerance  # June 8th 2018- Started Faslodex + Abema [abema-multiple interruptions sec to neutropenia]  # MRI Brain- March 2019- Leptomeningeal mets [WBRT; No LP s/p WBRT- 01/09/2018]; STOP Abema [567mday-sec to severe cytopenia]+faslodex  # MAY 20tth 019- Start Xeloda; STOPPED in Nov 22nd 2019- sec to Progressive liver lesions on CT scan  # NOV 26th 2019- Faslodex + Piqaray 25052m; Jan 2020- Piqray 300 mg+ faslodex; FEB 17th CT scan- mixed response/ Overall progression; STOP Piqray + faslodex.   # FEB 25th 2020- ERIBULIN; Poor tolerance sec to severe neutropenia.  Stopped May 2020.  CT scan May 2020-new/progressive 2 cm liver lesion;   #July nd 2020-Ixempra x5 cycles-; NOV 2020-MRI liver- worsening liver lesions [rising  AST/ALT].   # NOV [id number] weekly [consent-]; December 31-2020-progression/rising LFTs.  Stop Taxol  #January 15th, 2021-Adriamycin weekly [C]; MARCH 2021- Partial response. STOP ADRIAMYCIN sec to EF drop 40-45%.   # April 2nd-2021-gemcitabine; 01/15/2020-GEM [800 mg/m2; #3 cycle 1000m13m] q 2 W; with onpro.  September 2021-CT scan progressive disease; discontinue gemcitabine.  September 2020 one 2D echo-50 to 55% ejection fraction  #July 04, 2020-Doxil [35 mg meter square]; on pro; every 4 weeks  #May 06, 2019-MRI brain 5 new lesions [3 mm to 10 mm]-asymptomatic; [previous whole brain radiation]- OCT 2020- Re-Irradiation.   # PN- G-2; may 2017- Cymbalta 60mg75mARCH 2018-  acute vascular insuff of BIL LE [? Taxotere vasoconstriction on asprin]; # hemorragic shock LGIB [s/p colo; Dr.Wohl]  #  SVT- on flecainide.   # April 2016- Liver Bx- NEG;   # Drop in EF from Herceptin [recovered OCt 27th- EF 65%]; March 2020 ejection fraction 40 to 45%.  Stop Adriamycin.  # BMD- jan 2017- osteopenia; #Gait instability/visual disturbance-[Dr.Shah]- S/p EEG [5/13]- negative for seizures.  # 2020- SEP-s/p [facility] care evaluation  -----------------------------------------------------------     MOLECULAR TESTING- [facility]- Sep 4th 2018- PDL-1 0%/NEG for BRCA; MSI-Stable; Positive for PI3K; ccnd-1; FGF** -----------------------------------------------------------  Dx: Metastatic Breast cancer [ER/PR-Pos; her -2 NEG] Stage IV; goals: palliative Current treatment-DOXIL  chemotherapy [C]   Carcinoma of upper-outer quadrant of left breast in female, estrogen receptor positive (HCC) [facility]25/2020 - 01/17/2019 Chemotherapy   The patient had eriBULin mesylate (HALAVEN) 2.1 mg in sodium chloride 0.9 % 100 mL chemo infusion, 1.2 mg/m2 = 2.1  mg (100 % of original dose 1.2 mg/m2), Intravenous,  Once, 3 of 4 cycles Dose modification: 1.2 mg/m2 (original dose 1.2 mg/m2, Cycle 1, Reason: Provider  Judgment), 0.8 mg/m2 (original dose 1.2 mg/m2, Cycle 3, Reason: Provider Judgment) Administration: 2.1 mg (11/22/2018), 2.1 mg (11/29/2018), 2.1 mg (12/20/2018), 1.4 mg (01/17/2019)  for chemotherapy treatment.    03/30/2019 - 06/22/2019 Chemotherapy   The patient had pegfilgrastim (NEULASTA ONPRO KIT) injection 6 mg, 6 mg, Subcutaneous, Once, 5 of 8 cycles Administration: 6 mg (03/30/2019), 6 mg (04/20/2019), 6 mg (05/11/2019), 6 mg (06/22/2019), 6 mg (06/01/2019) ixabepilone (IXEMPRA) 60 mg in lactated ringers 250 mL chemo infusion, 33.5 mg/m2 = 57 mg (100 % of original dose 32 mg/m2), Intravenous,  Once, 5 of 8 cycles Dose modification: 32 mg/m2 (original dose 32 mg/m2, Cycle 1, Reason: Provider Judgment) Administration: 72 mg (04/20/2019), 72 mg (05/11/2019), 72 mg (06/22/2019), 72 mg (06/01/2019)  for chemotherapy treatment.    08/18/2019 - 09/15/2019 Chemotherapy   The patient had PACLitaxel (TAXOL) 150 mg in sodium chloride 0.9 % 250 mL chemo infusion (</= $RemoveBefor'80mg'fziGnDTVvIqL$ /m2), 80 mg/m2 = 150 mg, Intravenous,  Once, 1 of 4 cycles Administration: 150 mg (08/18/2019), 150 mg (09/08/2019), 150 mg (09/15/2019)  for chemotherapy treatment.    10/13/2019 - 11/24/2019 Chemotherapy   The patient had dexamethasone (DECADRON) 4 MG tablet, 8 mg, Oral, Daily, 1 of 1 cycle, Start date: --, End date: -- DOXOrubicin (ADRIAMYCIN) chemo injection 38 mg, 20 mg/m2 = 38 mg, Intravenous,  Once, 6 of 9 cycles Administration: 38 mg (10/13/2019), 38 mg (10/20/2019), 38 mg (10/27/2019), 38 mg (11/17/2019), 38 mg (11/24/2019), 38 mg (11/10/2019) palonosetron (ALOXI) injection 0.25 mg, 0.25 mg, Intravenous,  Once, 6 of 9 cycles Administration: 0.25 mg (10/13/2019), 0.25 mg (10/20/2019), 0.25 mg (10/27/2019), 0.25 mg (11/17/2019), 0.25 mg (11/24/2019), 0.25 mg (11/10/2019)  for chemotherapy treatment.    12/29/2019 - 06/20/2020 Chemotherapy   The patient had pegfilgrastim (NEULASTA ONPRO KIT) injection 6 mg, 6 mg, Subcutaneous, Once, 12 of 18  cycles Administration: 6 mg (01/15/2020), 6 mg (01/29/2020), 6 mg (02/12/2020), 6 mg (02/28/2020), 6 mg (03/14/2020), 6 mg (03/28/2020), 6 mg (04/11/2020), 6 mg (04/25/2020), 6 mg (05/09/2020), 6 mg (05/23/2020), 6 mg (06/06/2020), 6 mg (06/20/2020) gemcitabine (GEMZAR) 1,444 mg in sodium chloride 0.9 % 250 mL chemo infusion, 800 mg/m2 = 1,444 mg, Intravenous,  Once, 13 of 19 cycles Dose modification: 1,000 mg/m2 (original dose 800 mg/m2, Cycle 3, Reason: Provider Judgment) Administration: 1,444 mg (12/29/2019), 1,444 mg (01/15/2020), 1,800 mg (01/29/2020), 1,800 mg (02/12/2020), 1,800 mg (02/28/2020), 1,800 mg (03/14/2020), 1,800 mg (03/28/2020), 1,800 mg (04/11/2020), 1,800 mg (04/25/2020), 1,800 mg (05/09/2020), 1,800 mg (05/23/2020), 1,800 mg (06/06/2020), 1,800 mg (06/20/2020)  for chemotherapy treatment.    07/04/2020 -  Chemotherapy   The patient had pegfilgrastim (NEULASTA ONPRO KIT) injection 6 mg, 6 mg, Subcutaneous, Once, 1 of 4 cycles Administration: 6 mg (07/04/2020) DOXOrubicin HCL LIPOSOMAL (DOXIL) 60 mg in dextrose 5 % 250 mL chemo infusion, 35 mg/m2 = 60 mg (100 % of original dose 35 mg/m2), Intravenous,  Once, 1 of 4 cycles Dose modification: 35 mg/m2 (original dose 35 mg/m2, Cycle 1, Reason: Provider Judgment) Administration: 60 mg (07/04/2020)  for chemotherapy treatment.      INTERVAL HISTORY:  Sarah Horne 64 y.o.  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  Patient received cycle #1 of Doxil approximate 2 weeks ago.  Patient continues  to be living at the rehab.  Patient stated she fell recently slipped on the ground trying to call the bathroom.  Patient not hit her head.  Noted to have sprained her right ankle; x-ray negative for any fractures.  Denies any bleeding.  Patient denies any diarrhea.  Denies any fevers or chills.  Appetite is poor/states she does not like the food at  the rehab.  She has lost weight.  No nausea no vomiting.   Review of Systems  Constitutional: Positive for malaise/fatigue. Negative for chills, diaphoresis and fever.  HENT: Negative for nosebleeds and sore throat.   Eyes: Negative for double vision.  Respiratory: Negative for cough, hemoptysis, sputum production, shortness of breath and wheezing.   Cardiovascular: Negative for chest pain, palpitations, orthopnea and leg swelling.  Gastrointestinal: Positive for constipation. Negative for abdominal pain, blood in stool, heartburn, melena, nausea and vomiting.  Genitourinary: Negative for dysuria, frequency and urgency.  Musculoskeletal: Positive for joint pain. Negative for back pain.  Skin: Negative for itching.  Neurological: Positive for dizziness and tingling. Negative for focal weakness, weakness and headaches.  Endo/Heme/Allergies: Does not bruise/bleed easily.  Psychiatric/Behavioral: Negative for depression. The patient is not nervous/anxious and does not have insomnia.     PAST MEDICAL HISTORY :  Past Medical History:  Diagnosis Date  . Chemotherapy-induced peripheral neuropathy ([place])   . Epistaxis    a. 11/2016 in setting of asa/plavix-->silver nitrate cauterization.  . GI bleed    a. 11/2016 Admission w/ GIB and hypovolemic shock req 3u PRBC's;  b. 11/2016 ECG: gastritis & nonbleeding peptic ulcer; c. 11/2016 Conlonoscopy: rectal and sigmoid colonic ulcers.  . Heart attack ([place])    a. 1998 Cath @ [facility]: reportedly no intervention required.  Marland Kitchen Herceptin-induced cardiomyopathy ([place])    a. In the setting of Herceptin Rx for breast cancer (initiated 12/2014); b. 03/2015 MUGA EF 64%; b. 08/2015 MUGA: EF 51%; c. 10/2015 MUGA: EF 44%; d. 11/2015 Echo: EF 45-50%; e. 01/2016 MUGA: EF 60%; f. 06/2016 MUGA EF 65%; g. 10/2016 MUGA: EF 61%;  h. 12/2016 Echo: EF 55-60%, gr1 DD.  Marland Kitchen Hyperlipidemia   . Hypertension   . Neuropathy   . Possible PAD (peripheral artery disease) ([place])    a. 11/2016 LE  cyanosis and weak pulses-->CTA w/o significant Ao-BiFem dzs. ? distal dzs-->ASA/Plavix initiated by vascular surgery.  Marland Kitchen PSVT (paroxysmal supraventricular tachycardia) ([place])    a. Dx 11/2016.  . Pulmonary embolism ([place])    a. 12/2016 CTA Chest: small nonocclusive PE in inferior segment of the Left lingula, somewhat eccentric filling defect suggesting chronic rather than acute embolic event; b. 10/8784 LE U/S:  No DVT; c. 12/2016 Echo: Nl RV fxn, nl PASP.  Marland Kitchen Recurrent Metastatic breast cancer ([place])    a. Dx 2016: Stage II, ER positive, PR positive, HER-2/neu overexpressing of the left breast-->chemo/radiation; b. 10/2016 CT Abd/pelvis: diffuse liver mets, ill defined sclerotic bone lesions-T12;  c. 10/2016 MRI brain: metastatic lesion along L temporal lobe (19x77mm) w/ extensive surrounding edema & 4mm midline shift to right.  . Sepsis ([place])   . Sinus tachycardia     PAST SURGICAL HISTORY :   Past Surgical History:  Procedure Laterality Date  . BREAST BIOPSY Left 2016   Positive  . BREAST LUMPECTOMY WITH SENTINEL LYMPH NODE BIOPSY Left 05/23/2015   Procedure: LEFT BREAST WIDE EXCISION WITH AXILLARY DISSECTION, MASTOPLASTY ;  Surgeon: [name], MD;  Location: [facility] ORS;  Service: General;  Laterality: Left;  . BREAST  SURGERY Left 12/18/14   breast biopsy/INVASIVE DUCTAL CARCINOMA OF BREAST, NOTTINGHAM GRADE 2.  Marland Kitchen BREAST SURGERY  05/23/2015.   Wide excision/mastoplasty, axillary dissection. No residual invasive cancer, positive for residual DCIS. 0/2 nodes identified on axillary dissection. (no SLN by technetium or methylene blue)  . CARDIAC CATHETERIZATION    . COLONOSCOPY WITH PROPOFOL N/A 12/10/2016   Procedure: COLONOSCOPY WITH PROPOFOL;  Surgeon: Lucilla Lame, MD;  Location: [facility] ENDOSCOPY;  Service: Endoscopy;  Laterality: N/A;  . COLONOSCOPY WITH PROPOFOL N/A 02/13/2017   Procedure: COLONOSCOPY WITH PROPOFOL;  Surgeon: Lucilla Lame, MD;  Location: [facility] ENDOSCOPY;  Service: Endoscopy;   Laterality: N/A;  . ESOPHAGOGASTRODUODENOSCOPY N/A 11/03/2019   Procedure: ESOPHAGOGASTRODUODENOSCOPY (EGD);  Surgeon: Lin Landsman, MD;  Location[facility] ENDOSCOPY;  Service: Gastroenterology;  Laterality: N/A;  . ESOPHAGOGASTRODUODENOSCOPY (EGD) WITH PROPOFOL N/A 12/08/2016   Procedure: ESOPHAGOGASTRODUODENOSCOPY (EGD) WITH PROPOFOL;  Surgeon: Lucilla Lame, MD;  Location: [facility] ENDOSCOPY;  Service: Endoscopy;  Laterality: N/A;  . IVC FILTER INSERTION N/A 02/15/2017   Procedure: IVC Filter Insertion;  Surgeon: Algernon Huxley, MD;  Location: [place] [facility];  Service: Cardiovascular;  Laterality: N/A;  . PORTACATH PLACEMENT Right 12-31-14   Dr Bary Castilla    FAMILY HISTORY :   Family History  Problem Relation Age of Onset  . Breast cancer Maternal Aunt   . Breast cancer Cousin   . Brain cancer Maternal Uncle   . Diabetes Mother   . Hypertension Mother   . Stroke Mother     SOCIAL HISTORY:   Social History   Tobacco Use  . Smoking status: Light Tobacco Smoker    Packs/day: 0.25    Years: 18.00    Pack years: 4.50    Types: Cigarettes  . Smokeless tobacco: Never Used  Vaping Use  . Vaping Use: Never used  Substance Use Topics  . Alcohol use: No    Alcohol/week: 0.0 standard drinks  . Drug use: No    ALLERGIES:  is allergic to no known allergies.  MEDICATIONS:  Current Outpatient Medications  Medication Sig Dispense Refill  . ELIQUIS 5 MG TABS tablet TAKE 1 TABLET BY MOUTH TWICE A DAY 60 tablet 4  . flecainide (TAMBOCOR) 50 MG tablet Take 1 tablet (50 mg total) by mouth 2 (two) times daily. 60 tablet 4  . levETIRAcetam (KEPPRA) 250 MG tablet Take 1 tablet by mouth 2 (two) times daily.     . magnesium oxide (MAG-OX) 400 MG tablet TAKE 1 TABLET BY MOUTH TWICE A DAY 60 tablet 4  . metoprolol tartrate (LOPRESSOR) 25 MG tablet TAKE 1 TABLET BY MOUTH TWICE A DAY 60 tablet 1  . pregabalin (LYRICA) 100 MG capsule TAKE 1 CAPSULE BY MOUTH TWICE A DAY 60 capsule 2  . Vitamin D,  Ergocalciferol, (DRISDOL) 1.25 MG (50000 UNIT) CAPS capsule TAKE 1 CAPSULE (50,000 UNITS TOTAL) ONCE A WEEK BY MOUTH. 12 capsule 1  . levocetirizine (XYZAL) 5 MG tablet Take 1 tablet by mouth daily. (Patient not taking: Reported on 07/18/2020)    . meclizine (ANTIVERT) 25 MG tablet Take 1 tablet (25 mg total) by mouth 3 (three) times daily as needed for dizziness. (Patient not taking: Reported on 06/24/2020) 30 tablet 0  . montelukast (SINGULAIR) 10 MG tablet TAKE 1 TABLET BY MOUTH EVERYDAY AT BEDTIME (Patient not taking: Reported on 07/18/2020) 90 tablet 2  . ondansetron (ZOFRAN) 8 MG tablet One pill every 8 hours as needed for nausea/vomitting. (Patient not taking: Reported on 06/24/2020) 40 tablet  1  . prochlorperazine (COMPAZINE) 10 MG tablet Take 1 tablet (10 mg total) by mouth every 6 (six) hours as needed for nausea or vomiting. (Patient not taking: Reported on 06/24/2020) 40 tablet 1   No current facility-administered medications for this visit.   Facility-Administered Medications Ordered in Other Visits  Medication Dose Route Frequency Provider Last Rate Last Admin  . 0.9 %  sodium chloride infusion   Intravenous Continuous Janese Banks, MD   New Bag at 12/10/16 1140  . 0.9 %  sodium chloride infusion   Intravenous Continuous Jeralyn Ruths, MD 999 mL/hr at 04/24/15 1520 New Bag at 05/23/15 0951  . 0.9 %  sodium chloride infusion   Intravenous Continuous Alinda Dooms, NP 999 mL/hr at 10/31/19 1522 New Bag at 10/31/19 1522  . heparin lock flush 100 unit/mL  500 Units Intravenous Once Berenzon, Dmitriy, MD      . sodium chloride 0.9 % injection 10 mL  10 mL Intravenous PRN Janese Banks, MD   10 mL at 04/04/15 1440  . sodium chloride flush (NS) 0.9 % injection 10 mL  10 mL Intravenous PRN Berenzon, Dmitriy, MD        PHYSICAL EXAMINATION: ECOG PERFORMANCE STATUS: 1 - Symptomatic but completely ambulatory  BP 101/65 (BP Location: Right Arm, Patient Position: Sitting)   Pulse 80    Temp (!) 97.3 F (36.3 C) (Tympanic)   Resp 18   Wt 127 lb (57.6 kg)   SpO2 98%   BMI 20.50 kg/m   Filed Weights   07/18/20 1045  Weight: 127 lb (57.6 kg)    Physical Exam Constitutional:      Comments: Patient is alone.  In wheel chair.   HENT:     Head: Normocephalic and atraumatic.     Mouth/Throat:     Pharynx: No oropharyngeal exudate.  Eyes:     Pupils: Pupils are equal, round, and reactive to light.  Cardiovascular:     Rate and Rhythm: Normal rate and regular rhythm.  Pulmonary:     Effort: Pulmonary effort is normal. No respiratory distress.     Breath sounds: Normal breath sounds. No wheezing.  Abdominal:     General: Bowel sounds are normal. There is no distension.     Palpations: Abdomen is soft. There is no mass.     Tenderness: There is no abdominal tenderness. There is no guarding or rebound.  Musculoskeletal:        General: No tenderness. Normal range of motion.     Cervical back: Normal range of motion and neck supple.  Skin:    General: Skin is warm.     Comments: Bilateral foot plantar calluses noted.  Tender to palpation.  Neurological:     Mental Status: She is alert and oriented to person, place, and time.     Comments: Broad based gait.  Unsteady.  Psychiatric:        Mood and Affect: Affect normal.     LABORATORY DATA:  I have reviewed the data as listed    Component Value Date/Time   NA 139 07/18/2020 0957   NA 135 03/04/2017 0918   NA 135 01/08/2015 0850   K 3.9 07/18/2020 0957   K 3.7 01/08/2015 0850   CL 101 07/18/2020 0957   CL 101 01/08/2015 0850   CO2 29 07/18/2020 0957   CO2 26 01/08/2015 0850   GLUCOSE 87 07/18/2020 0957   GLUCOSE 161 (H) 01/08/2015 0850   BUN 17 07/18/2020  0957   BUN 8 03/04/2017 0918   BUN 17 01/08/2015 0850   CREATININE 0.57 07/18/2020 0957   CREATININE 0.82 01/22/2015 1559   CALCIUM 8.7 (L) 07/18/2020 0957   CALCIUM 9.3 01/08/2015 0850   PROT 7.0 07/18/2020 0957   PROT 6.4 03/04/2017 0918    PROT 6.8 01/22/2015 1559   ALBUMIN 3.5 07/18/2020 0957   ALBUMIN 2.5 (L) 03/04/2017 0918   ALBUMIN 3.9 01/22/2015 1559   AST 112 (H) 07/18/2020 0957   AST 34 01/22/2015 1559   ALT 73 (H) 07/18/2020 0957   ALT 42 01/22/2015 1559   ALKPHOS 556 (H) 07/18/2020 0957   ALKPHOS 157 (H) 01/22/2015 1559   BILITOT 1.7 (H) 07/18/2020 0957   BILITOT 6.2 (H) 03/04/2017 0918   BILITOT 0.2 (L) 01/22/2015 1559   GFRNONAA >60 07/18/2020 0957   GFRNONAA >60 01/22/2015 1559   GFRAA >60 06/26/2020 0937   GFRAA >60 01/22/2015 1559    No results found for: SPEP, UPEP  Lab Results  Component Value Date   WBC 4.3 07/18/2020   NEUTROABS 3.6 07/18/2020   HGB 13.5 07/18/2020   HCT 38.6 07/18/2020   MCV 94.6 07/18/2020   PLT 234 07/18/2020      Chemistry      Component Value Date/Time   NA 139 07/18/2020 0957   NA 135 03/04/2017 0918   NA 135 01/08/2015 0850   K 3.9 07/18/2020 0957   K 3.7 01/08/2015 0850   CL 101 07/18/2020 0957   CL 101 01/08/2015 0850   CO2 29 07/18/2020 0957   CO2 26 01/08/2015 0850   BUN 17 07/18/2020 0957   BUN 8 03/04/2017 0918   BUN 17 01/08/2015 0850   CREATININE 0.57 07/18/2020 0957   CREATININE 0.82 01/22/2015 1559      Component Value Date/Time   CALCIUM 8.7 (L) 07/18/2020 0957   CALCIUM 9.3 01/08/2015 0850   ALKPHOS 556 (H) 07/18/2020 0957   ALKPHOS 157 (H) 01/22/2015 1559   AST 112 (H) 07/18/2020 0957   AST 34 01/22/2015 1559   ALT 73 (H) 07/18/2020 0957   ALT 42 01/22/2015 1559   BILITOT 1.7 (H) 07/18/2020 0957   BILITOT 6.2 (H) 03/04/2017 0918   BILITOT 0.2 (L) 01/22/2015 1559       RADIOGRAPHIC STUDIES: I have personally reviewed the radiological images as listed and agreed with the findings in the report. No results found.   ASSESSMENT & PLAN:  Carcinoma of upper-outer quadrant of left breast in female, estrogen receptor positive (HCC) # Recurrent metastatic breast cancer-lung/liver metastases ER PR positive HER-2 negative;  SEP 2021-  PROGRESSIVE DISEASE- in liver/lungs. On DOXIL q 4 W.  # s/p cycle #1; day-14 today.  Tolerated chemotherapy fairly well.  However, concerning for progression [as noted on the LFTs]; however again is too early to assess response to therapy at this time.  # Gait instability- worse [Hx of LP disease/RT]- currently on PT; s/p fall- again importance of avoiding falls- STABLE.    # cardiomyopathy/ Sinus tachycardia- on metoprolol-/Tamabcor; [SEP  2d-echo- 50-55%]; STABLE.   # Elevated LFTs/alkaline phosphatase 520-.  Likely secondary underlying cirrhosis/ chemo/underlying malignancy-worsening-monitor closely.  # Peripheral neuropathy grade 1-2-  on Lyrica-stable  # weight loss/ poor appetite; sec to malignancy/ chemo- STABLE; continue ensure.    # Right lower extremity DVT bilateral [November 28, 2019] CT scan abdomen pelvis negative for any DVT.  On eliquis; STABLE.   # DISPOSITION:  # NO iVFs; de-access #  2  weeks MD; labs- cbc/cmp Doxil chemo/onpro- Dr.B      No orders of the defined types were placed in this encounter.  All questions were answered. The patient knows to call the clinic with any problems, questions or concerns.     Cammie Sickle, MD 07/18/2020 11:53 AM

## 2020-07-18 NOTE — Addendum Note (Signed)
Addended by: Delice Bison E on: 07/18/2020 01:41 PM   Modules accepted: Orders

## 2020-07-18 NOTE — Progress Notes (Signed)
Pt states she is staying at [facility] and states she is eating poorly, states food does not taste well and she does not get any ensure or boost type supplements.  Pt also states she fell on Saturday and hurt her right ankle, states was xrayed and is a sprain but is having pain in ankle.

## 2020-07-18 NOTE — Assessment & Plan Note (Addendum)
#  Recurrent metastatic breast cancer-lung/liver metastases ER PR positive HER-2 negative;  SEP 2021- PROGRESSIVE DISEASE- in liver/lungs. On DOXIL q 4 W.  # s/p cycle #1; day-14 today.  Tolerated chemotherapy fairly well.  However, concerning for progression [as noted on the LFTs]; however again is too early to assess response to therapy at this time.  # Gait instability- worse [Hx of LP disease/RT]- currently on PT; s/p fall- again importance of avoiding falls- STABLE.    # cardiomyopathy/ Sinus tachycardia- on metoprolol-/Tamabcor; [SEP  2d-echo- 50-55%]; STABLE.   # Elevated LFTs/alkaline phosphatase 520-.  Likely secondary underlying cirrhosis/ chemo/underlying malignancy-worsening-monitor closely.  # Peripheral neuropathy grade 1-2-  on Lyrica-stable  # weight loss/ poor appetite; sec to malignancy/ chemo- STABLE; continue ensure.    # Right lower extremity DVT bilateral [November 28, 2019] CT scan abdomen pelvis negative for any DVT.  On eliquis; STABLE.   # DISPOSITION:  # NO iVFs; de-access #  2 weeks MD; labs- cbc/cmp Doxil chemo/onpro- Dr.B     

## 2020-07-29 DIAGNOSIS — Z86711 Personal history of pulmonary embolism: Secondary | ICD-10-CM | POA: Diagnosis not present

## 2020-07-29 DIAGNOSIS — C7800 Secondary malignant neoplasm of unspecified lung: Secondary | ICD-10-CM | POA: Diagnosis not present

## 2020-07-29 DIAGNOSIS — Z170 Estrogen receptor positive status [ER+]: Secondary | ICD-10-CM | POA: Diagnosis not present

## 2020-07-29 DIAGNOSIS — D72829 Elevated white blood cell count, unspecified: Secondary | ICD-10-CM | POA: Diagnosis not present

## 2020-07-29 DIAGNOSIS — Z95828 Presence of other vascular implants and grafts: Secondary | ICD-10-CM | POA: Diagnosis not present

## 2020-07-29 DIAGNOSIS — Z7901 Long term (current) use of anticoagulants: Secondary | ICD-10-CM | POA: Diagnosis not present

## 2020-07-29 DIAGNOSIS — Z5189 Encounter for other specified aftercare: Secondary | ICD-10-CM | POA: Diagnosis not present

## 2020-07-29 DIAGNOSIS — R634 Abnormal weight loss: Secondary | ICD-10-CM | POA: Diagnosis not present

## 2020-07-29 DIAGNOSIS — F17210 Nicotine dependence, cigarettes, uncomplicated: Secondary | ICD-10-CM | POA: Diagnosis not present

## 2020-07-29 DIAGNOSIS — R7989 Other specified abnormal findings of blood chemistry: Secondary | ICD-10-CM | POA: Diagnosis not present

## 2020-07-29 DIAGNOSIS — Z803 Family history of malignant neoplasm of breast: Secondary | ICD-10-CM | POA: Diagnosis not present

## 2020-07-29 DIAGNOSIS — Z5111 Encounter for antineoplastic chemotherapy: Secondary | ICD-10-CM | POA: Diagnosis not present

## 2020-07-29 DIAGNOSIS — I471 Supraventricular tachycardia: Secondary | ICD-10-CM | POA: Diagnosis not present

## 2020-07-29 DIAGNOSIS — J439 Emphysema, unspecified: Secondary | ICD-10-CM | POA: Diagnosis not present

## 2020-07-29 DIAGNOSIS — Z515 Encounter for palliative care: Secondary | ICD-10-CM | POA: Diagnosis not present

## 2020-07-29 DIAGNOSIS — C50919 Malignant neoplasm of unspecified site of unspecified female breast: Secondary | ICD-10-CM | POA: Diagnosis not present

## 2020-07-29 DIAGNOSIS — M6281 Muscle weakness (generalized): Secondary | ICD-10-CM | POA: Diagnosis not present

## 2020-07-29 DIAGNOSIS — Z79899 Other long term (current) drug therapy: Secondary | ICD-10-CM | POA: Diagnosis not present

## 2020-07-29 DIAGNOSIS — C50412 Malignant neoplasm of upper-outer quadrant of left female breast: Secondary | ICD-10-CM | POA: Diagnosis not present

## 2020-07-29 DIAGNOSIS — G40909 Epilepsy, unspecified, not intractable, without status epilepticus: Secondary | ICD-10-CM | POA: Diagnosis not present

## 2020-07-29 DIAGNOSIS — Z86718 Personal history of other venous thrombosis and embolism: Secondary | ICD-10-CM | POA: Diagnosis not present

## 2020-07-29 DIAGNOSIS — C7931 Secondary malignant neoplasm of brain: Secondary | ICD-10-CM | POA: Diagnosis not present

## 2020-07-29 DIAGNOSIS — G629 Polyneuropathy, unspecified: Secondary | ICD-10-CM | POA: Diagnosis not present

## 2020-08-01 ENCOUNTER — Inpatient Hospital Stay: Payer: Medicare HMO

## 2020-08-01 ENCOUNTER — Inpatient Hospital Stay (HOSPITAL_BASED_OUTPATIENT_CLINIC_OR_DEPARTMENT_OTHER): Payer: Medicare HMO | Admitting: Internal Medicine

## 2020-08-01 ENCOUNTER — Inpatient Hospital Stay: Payer: Medicare HMO | Attending: Internal Medicine

## 2020-08-01 ENCOUNTER — Encounter: Payer: Self-pay | Admitting: Internal Medicine

## 2020-08-01 VITALS — BP 108/77 | HR 71 | Temp 96.7°F | Resp 16 | Ht 66.0 in | Wt 131.0 lb

## 2020-08-01 DIAGNOSIS — Z86711 Personal history of pulmonary embolism: Secondary | ICD-10-CM | POA: Diagnosis not present

## 2020-08-01 DIAGNOSIS — Z7189 Other specified counseling: Secondary | ICD-10-CM

## 2020-08-01 DIAGNOSIS — Z5189 Encounter for other specified aftercare: Secondary | ICD-10-CM | POA: Diagnosis not present

## 2020-08-01 DIAGNOSIS — Z5111 Encounter for antineoplastic chemotherapy: Secondary | ICD-10-CM | POA: Diagnosis not present

## 2020-08-01 DIAGNOSIS — G629 Polyneuropathy, unspecified: Secondary | ICD-10-CM | POA: Diagnosis not present

## 2020-08-01 DIAGNOSIS — Z86718 Personal history of other venous thrombosis and embolism: Secondary | ICD-10-CM | POA: Insufficient documentation

## 2020-08-01 DIAGNOSIS — Z803 Family history of malignant neoplasm of breast: Secondary | ICD-10-CM | POA: Diagnosis not present

## 2020-08-01 DIAGNOSIS — Z7901 Long term (current) use of anticoagulants: Secondary | ICD-10-CM | POA: Insufficient documentation

## 2020-08-01 DIAGNOSIS — Z170 Estrogen receptor positive status [ER+]: Secondary | ICD-10-CM | POA: Diagnosis not present

## 2020-08-01 DIAGNOSIS — R634 Abnormal weight loss: Secondary | ICD-10-CM | POA: Insufficient documentation

## 2020-08-01 DIAGNOSIS — C50412 Malignant neoplasm of upper-outer quadrant of left female breast: Secondary | ICD-10-CM | POA: Diagnosis not present

## 2020-08-01 DIAGNOSIS — R7989 Other specified abnormal findings of blood chemistry: Secondary | ICD-10-CM | POA: Diagnosis not present

## 2020-08-01 DIAGNOSIS — Z79899 Other long term (current) drug therapy: Secondary | ICD-10-CM | POA: Diagnosis not present

## 2020-08-01 DIAGNOSIS — F17210 Nicotine dependence, cigarettes, uncomplicated: Secondary | ICD-10-CM | POA: Insufficient documentation

## 2020-08-01 DIAGNOSIS — C7931 Secondary malignant neoplasm of brain: Secondary | ICD-10-CM | POA: Insufficient documentation

## 2020-08-01 LAB — CBC WITH DIFFERENTIAL/PLATELET
Abs Immature Granulocytes: 0 10*3/uL (ref 0.00–0.07)
Basophils Absolute: 0 10*3/uL (ref 0.0–0.1)
Basophils Relative: 1 %
Eosinophils Absolute: 0 10*3/uL (ref 0.0–0.5)
Eosinophils Relative: 1 %
HCT: 38.3 % (ref 36.0–46.0)
Hemoglobin: 13.1 g/dL (ref 12.0–15.0)
Immature Granulocytes: 0 %
Lymphocytes Relative: 23 %
Lymphs Abs: 0.5 10*3/uL — ABNORMAL LOW (ref 0.7–4.0)
MCH: 33.2 pg (ref 26.0–34.0)
MCHC: 34.2 g/dL (ref 30.0–36.0)
MCV: 97 fL (ref 80.0–100.0)
Monocytes Absolute: 0.6 10*3/uL (ref 0.1–1.0)
Monocytes Relative: 25 %
Neutro Abs: 1.1 10*3/uL — ABNORMAL LOW (ref 1.7–7.7)
Neutrophils Relative %: 50 %
Platelets: 189 10*3/uL (ref 150–400)
RBC: 3.95 MIL/uL (ref 3.87–5.11)
RDW: 17.2 % — ABNORMAL HIGH (ref 11.5–15.5)
Smear Review: ADEQUATE
WBC: 2.2 10*3/uL — ABNORMAL LOW (ref 4.0–10.5)
nRBC: 0 % (ref 0.0–0.2)

## 2020-08-01 LAB — COMPREHENSIVE METABOLIC PANEL
ALT: 118 U/L — ABNORMAL HIGH (ref 0–44)
AST: 197 U/L — ABNORMAL HIGH (ref 15–41)
Albumin: 3.2 g/dL — ABNORMAL LOW (ref 3.5–5.0)
Alkaline Phosphatase: 741 U/L — ABNORMAL HIGH (ref 38–126)
Anion gap: 7 (ref 5–15)
BUN: 15 mg/dL (ref 8–23)
CO2: 28 mmol/L (ref 22–32)
Calcium: 8.7 mg/dL — ABNORMAL LOW (ref 8.9–10.3)
Chloride: 103 mmol/L (ref 98–111)
Creatinine, Ser: 0.5 mg/dL (ref 0.44–1.00)
GFR, Estimated: >60 mL/min (ref >60)
Glucose, Bld: 83 mg/dL (ref 70–99)
Potassium: 4 mmol/L (ref 3.5–5.1)
Sodium: 138 mmol/L (ref 135–145)
Total Bilirubin: 1.3 mg/dL — ABNORMAL HIGH (ref 0.3–1.2)
Total Protein: 6.6 g/dL (ref 6.5–8.1)

## 2020-08-01 MED ORDER — SODIUM CHLORIDE 0.9 % IV SOLN
10.0000 mg | Freq: Once | INTRAVENOUS | Status: AC
  Administered 2020-08-01: 10 mg via INTRAVENOUS
  Filled 2020-08-01: qty 10

## 2020-08-01 MED ORDER — HEPARIN SOD (PORK) LOCK FLUSH 100 UNIT/ML IV SOLN
500.0000 [IU] | Freq: Once | INTRAVENOUS | Status: AC | PRN
  Administered 2020-08-01: 500 [IU]
  Filled 2020-08-01: qty 5

## 2020-08-01 MED ORDER — DOXORUBICIN HCL LIPOSOMAL CHEMO INJECTION 2 MG/ML
35.0000 mg/m2 | Freq: Once | INTRAVENOUS | Status: AC
  Administered 2020-08-01: 60 mg via INTRAVENOUS
  Filled 2020-08-01: qty 30

## 2020-08-01 MED ORDER — HEPARIN SOD (PORK) LOCK FLUSH 100 UNIT/ML IV SOLN
INTRAVENOUS | Status: AC
  Filled 2020-08-01: qty 5

## 2020-08-01 MED ORDER — SODIUM CHLORIDE 0.9% FLUSH
10.0000 mL | Freq: Once | INTRAVENOUS | Status: AC
  Administered 2020-08-01: 10 mL via INTRAVENOUS
  Filled 2020-08-01: qty 10

## 2020-08-01 MED ORDER — DEXTROSE 5 % IV SOLN
Freq: Once | INTRAVENOUS | Status: AC
  Filled 2020-08-01: qty 250

## 2020-08-01 MED ORDER — PEGFILGRASTIM 6 MG/0.6ML ~~LOC~~ PSKT
6.0000 mg | PREFILLED_SYRINGE | Freq: Once | SUBCUTANEOUS | Status: AC
  Administered 2020-08-01: 6 mg via SUBCUTANEOUS
  Filled 2020-08-01: qty 0.6

## 2020-08-01 NOTE — Assessment & Plan Note (Addendum)
#  Recurrent metastatic breast cancer-lung/liver metastases ER PR positive HER-2 negative;  SEP 2021- PROGRESSIVE DISEASE- in liver/lungs. On DOXIL q 4 W.  # proceed with cycle #2 [$RemoveB'35mg'CCoWaYiS$ /m2]; Marland Kitchen  Tolerated chemotherapy fairly well.  However, concerning for progression [as noted on the LFTs]. Will order CT scan after cycle #2; ordered today.   # Gait instability- worse [Hx of LP disease/RT]- currently on PT; s/p fall- again importance of avoiding falls- STABLE.    # cardiomyopathy/ Sinus tachycardia- on metoprolol-/Tamabcor; [SEP  2d-echo- 50-55%]; STABLE.   # Elevated LFTs/alkaline phosphatase 720;-AST/ALT-120s; TB-1.3;   Likely secondary underlying cirrhosis/ chemo/underlying malignancy-worsening-monitor closely. Await Imaging as above.   # Peripheral neuropathy grade 1-2-  on Lyrica-STABLE.   # weight loss/ poor appetite; sec to malignancy/ chemo- STABLE;     # Right lower extremity DVT bilateral [November 28, 2019] CT scan abdomen pelvis negative for any DVT.  On eliquis; STABLE.    # DISPOSITION:  # chemo today.  #  2 weeks MD; labs- cbc/cmp; Possible IVFs over 1 hour- Dr.B #  4 weeks MD; labs- cbc/cmp Doxil chemo/onpro; CT C/A/P prior-- Dr.B  Addendum: 11/04-called and updated patient's brother-patient overall guarded prognosis; and elevated liver numbers suggestive of progressive disease.  Discussed that awaiting repeat scans in 1 month.

## 2020-08-01 NOTE — Progress Notes (Signed)
[place] OFFICE PROGRESS NOTE  Patient Care Team: Delsa Grana, PA-C as PCP - General (Family Medicine) Wellington Hampshire, MD as PCP - Cardiology (Cardiology) Bary Castilla, Forest Gleason, MD (General Surgery) Vladimir Crofts, MD as Consulting Physician (Neurology) Cammie Sickle, MD as Consulting Physician (Internal Medicine)  Cancer Staging No matching staging information was found for the patient.   Oncology History Overview Note  # LEFT BREAST IDC; STAGE II [cT2N1] ER >90%; PR- 50-90%; her 2 NEU- POS; s/p Neoadj chemo; AUG 2016- TCH+P s/p Lumpec & partial ALND- path CR; s/p RT [finished Nov 2016];  adj Herceptin; HELD for Jan 19th 2017 [in DEC 2016-EF dropped from 63 to 51%; FEB 27th EF-42.9%]; JAN 2016 START Arimidex; May 2017- EF- 60%; May 24th 2017-Re-start Herceptin q 3W; July 28th STOP herceptin [finished 48m/m2; sec to Low EF; however 2017 Oct EF= 67%; improved]  # DEC 4th 2017- Start Neratinib 4 pills; DEC 11th 3 pills; STOPPED.  # FEB 2018- RECURRENCE ER/PR positive; Her 2 NEGATIVE [liver Bx]  # MARCH 1st 2018- Tax-Cytoxan [poor tol]  # FEB 2018- Brain mets [SBRT; Dr.Crystal; finished Feb 28th 2018]  # March 2018- Eribulin- poor tolerance  # June 8th 2018- Started Faslodex + Abema [abema-multiple interruptions sec to neutropenia]  # MRI Brain- March 2019- Leptomeningeal mets [WBRT; No LP s/p WBRT- 01/09/2018]; STOP Abema [567mday-sec to severe cytopenia]+faslodex  # MAY 20tth 019- Start Xeloda; STOPPED in Nov 22nd 2019- sec to Progressive liver lesions on CT scan  # NOV 26th 2019- Faslodex + Piqaray 25052m; Jan 2020- Piqray 300 mg+ faslodex; FEB 17th CT scan- mixed response/ Overall progression; STOP Piqray + faslodex.   # FEB 25th 2020- ERIBULIN; Poor tolerance sec to severe neutropenia.  Stopped May 2020.  CT scan May 2020-new/progressive 2 cm liver lesion;   #July nd 2020-Ixempra x5 cycles-; NOV 2020-MRI liver- worsening liver lesions [rising  AST/ALT].   # NOV [id number] weekly [consent-]; December 31-2020-progression/rising LFTs.  Stop Taxol  #January 15th, 2021-Adriamycin weekly [C]; MARCH 2021- Partial response. STOP ADRIAMYCIN sec to EF drop 40-45%.   # April 2nd-2021-gemcitabine; 01/15/2020-GEM [800 mg/m2; #3 cycle 1000m13m] q 2 W; with onpro.  September 2021-CT scan progressive disease; discontinue gemcitabine.  September 2020 one 2D echo-50 to 55% ejection fraction  #July 04, 2020-Doxil [35 mg meter square]; on pro; every 4 weeks  #May 06, 2019-MRI brain 5 new lesions [3 mm to 10 mm]-asymptomatic; [previous whole brain radiation]- OCT 2020- Re-Irradiation.   # PN- G-2; may 2017- Cymbalta 60mg75mARCH 2018-  acute vascular insuff of BIL LE [? Taxotere vasoconstriction on asprin]; # hemorragic shock LGIB [s/p colo; Dr.Wohl]  #  SVT- on flecainide.   # April 2016- Liver Bx- NEG;   # Drop in EF from Herceptin [recovered OCt 27th- EF 65%]; March 2020 ejection fraction 40 to 45%.  Stop Adriamycin.  # BMD- jan 2017- osteopenia; #Gait instability/visual disturbance-[Dr.Shah]- S/p EEG [5/13]- negative for seizures.  # 2020- SEP-s/p Pallaitive care evaluation  -----------------------------------------------------------     MOLECULAR TESTING- [facility]- Sep 4th 2018- PDL-1 0%/NEG for BRCA; MSI-Stable; Positive for PI3K; ccnd-1; FGF** -----------------------------------------------------------  Dx: Metastatic Breast cancer [ER/PR-Pos; her -2 NEG] Stage IV; goals: palliative Current treatment-DOXIL  chemotherapy [C]   Carcinoma of upper-outer quadrant of left breast in female, estrogen receptor positive (HCC) [facility]25/2020 - 01/17/2019 Chemotherapy   The patient had eriBULin mesylate (HALAVEN) 2.1 mg in sodium chloride 0.9 % 100 mL chemo infusion, 1.2 mg/m2 = 2.1  mg (100 % of original dose 1.2 mg/m2), Intravenous,  Once, 3 of 4 cycles Dose modification: 1.2 mg/m2 (original dose 1.2 mg/m2, Cycle 1, Reason: Provider  Judgment), 0.8 mg/m2 (original dose 1.2 mg/m2, Cycle 3, Reason: Provider Judgment) Administration: 2.1 mg (11/22/2018), 2.1 mg (11/29/2018), 2.1 mg (12/20/2018), 1.4 mg (01/17/2019)  for chemotherapy treatment.    03/30/2019 - 06/22/2019 Chemotherapy   The patient had pegfilgrastim (NEULASTA ONPRO KIT) injection 6 mg, 6 mg, Subcutaneous, Once, 5 of 8 cycles Administration: 6 mg (03/30/2019), 6 mg (04/20/2019), 6 mg (05/11/2019), 6 mg (06/22/2019), 6 mg (06/01/2019) ixabepilone (IXEMPRA) 60 mg in lactated ringers 250 mL chemo infusion, 33.5 mg/m2 = 57 mg (100 % of original dose 32 mg/m2), Intravenous,  Once, 5 of 8 cycles Dose modification: 32 mg/m2 (original dose 32 mg/m2, Cycle 1, Reason: Provider Judgment) Administration: 72 mg (04/20/2019), 72 mg (05/11/2019), 72 mg (06/22/2019), 72 mg (06/01/2019)  for chemotherapy treatment.    08/18/2019 - 09/15/2019 Chemotherapy   The patient had PACLitaxel (TAXOL) 150 mg in sodium chloride 0.9 % 250 mL chemo infusion (</= $RemoveBefor'80mg'KKBvyfEndNBQ$ /m2), 80 mg/m2 = 150 mg, Intravenous,  Once, 1 of 4 cycles Administration: 150 mg (08/18/2019), 150 mg (09/08/2019), 150 mg (09/15/2019)  for chemotherapy treatment.    10/13/2019 - 11/24/2019 Chemotherapy   The patient had dexamethasone (DECADRON) 4 MG tablet, 8 mg, Oral, Daily, 1 of 1 cycle, Start date: --, End date: -- DOXOrubicin (ADRIAMYCIN) chemo injection 38 mg, 20 mg/m2 = 38 mg, Intravenous,  Once, 6 of 9 cycles Administration: 38 mg (10/13/2019), 38 mg (10/20/2019), 38 mg (10/27/2019), 38 mg (11/17/2019), 38 mg (11/24/2019), 38 mg (11/10/2019) palonosetron (ALOXI) injection 0.25 mg, 0.25 mg, Intravenous,  Once, 6 of 9 cycles Administration: 0.25 mg (10/13/2019), 0.25 mg (10/20/2019), 0.25 mg (10/27/2019), 0.25 mg (11/17/2019), 0.25 mg (11/24/2019), 0.25 mg (11/10/2019)  for chemotherapy treatment.    12/29/2019 - 06/20/2020 Chemotherapy   The patient had pegfilgrastim (NEULASTA ONPRO KIT) injection 6 mg, 6 mg, Subcutaneous, Once, 12 of 18  cycles Administration: 6 mg (01/15/2020), 6 mg (01/29/2020), 6 mg (02/12/2020), 6 mg (02/28/2020), 6 mg (03/14/2020), 6 mg (03/28/2020), 6 mg (04/11/2020), 6 mg (04/25/2020), 6 mg (05/09/2020), 6 mg (05/23/2020), 6 mg (06/06/2020), 6 mg (06/20/2020) gemcitabine (GEMZAR) 1,444 mg in sodium chloride 0.9 % 250 mL chemo infusion, 800 mg/m2 = 1,444 mg, Intravenous,  Once, 13 of 19 cycles Dose modification: 1,000 mg/m2 (original dose 800 mg/m2, Cycle 3, Reason: Provider Judgment) Administration: 1,444 mg (12/29/2019), 1,444 mg (01/15/2020), 1,800 mg (01/29/2020), 1,800 mg (02/12/2020), 1,800 mg (02/28/2020), 1,800 mg (03/14/2020), 1,800 mg (03/28/2020), 1,800 mg (04/11/2020), 1,800 mg (04/25/2020), 1,800 mg (05/09/2020), 1,800 mg (05/23/2020), 1,800 mg (06/06/2020), 1,800 mg (06/20/2020)  for chemotherapy treatment.    07/04/2020 -  Chemotherapy   The patient had pegfilgrastim (NEULASTA ONPRO KIT) injection 6 mg, 6 mg, Subcutaneous, Once, 2 of 4 cycles Administration: 6 mg (07/04/2020), 6 mg (08/01/2020) DOXOrubicin HCL LIPOSOMAL (DOXIL) 60 mg in dextrose 5 % 250 mL chemo infusion, 35 mg/m2 = 60 mg (100 % of original dose 35 mg/m2), Intravenous,  Once, 2 of 4 cycles Dose modification: 35 mg/m2 (original dose 35 mg/m2, Cycle 1, Reason: Provider Judgment) Administration: 60 mg (07/04/2020), 60 mg (08/01/2020)  for chemotherapy treatment.      INTERVAL HISTORY:  Sarah Horne 64 y.o.  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  Patient received cycle #1 of Doxil approximate  4 weeks ago.  Patient continues to be living at the rehab.  Patient appetite is good.  Denies any further falls.  She has gained some weight.  No headaches.  No nausea vomiting.  No rash.  No diarrhea.   Review of Systems  Constitutional: Positive for malaise/fatigue. Negative for chills, diaphoresis and fever.  HENT: Negative for nosebleeds  and sore throat.   Eyes: Negative for double vision.  Respiratory: Negative for cough, hemoptysis, sputum production, shortness of breath and wheezing.   Cardiovascular: Negative for chest pain, palpitations, orthopnea and leg swelling.  Gastrointestinal: Positive for constipation. Negative for abdominal pain, blood in stool, heartburn, melena, nausea and vomiting.  Genitourinary: Negative for dysuria, frequency and urgency.  Musculoskeletal: Positive for joint pain. Negative for back pain.  Skin: Negative for itching.  Neurological: Positive for dizziness and tingling. Negative for focal weakness, weakness and headaches.  Endo/Heme/Allergies: Does not bruise/bleed easily.  Psychiatric/Behavioral: Negative for depression. The patient is not nervous/anxious and does not have insomnia.     PAST MEDICAL HISTORY :  Past Medical History:  Diagnosis Date  . Chemotherapy-induced peripheral neuropathy ([place])   . Epistaxis    a. 11/2016 in setting of asa/plavix-->silver nitrate cauterization.  . GI bleed    a. 11/2016 Admission w/ GIB and hypovolemic shock req 3u PRBC's;  b. 11/2016 ECG: gastritis & nonbleeding peptic ulcer; c. 11/2016 Conlonoscopy: rectal and sigmoid colonic ulcers.  . Heart attack ([place])    a. 1998 Cath @ [facility]: reportedly no intervention required.  Marland Kitchen Herceptin-induced cardiomyopathy ([place])    a. In the setting of Herceptin Rx for breast cancer (initiated 12/2014); b. 03/2015 MUGA EF 64%; b. 08/2015 MUGA: EF 51%; c. 10/2015 MUGA: EF 44%; d. 11/2015 Echo: EF 45-50%; e. 01/2016 MUGA: EF 60%; f. 06/2016 MUGA EF 65%; g. 10/2016 MUGA: EF 61%;  h. 12/2016 Echo: EF 55-60%, gr1 DD.  Marland Kitchen Hyperlipidemia   . Hypertension   . Neuropathy   . Possible PAD (peripheral artery disease) ([place])    a. 11/2016 LE cyanosis and weak pulses-->CTA w/o significant Ao-BiFem dzs. ? distal dzs-->ASA/Plavix initiated by vascular surgery.  Marland Kitchen PSVT (paroxysmal supraventricular tachycardia) ([place])    a. Dx 11/2016.  . Pulmonary  embolism ([place])    a. 12/2016 CTA Chest: small nonocclusive PE in inferior segment of the Left lingula, somewhat eccentric filling defect suggesting chronic rather than acute embolic event; b. 10/3555 LE U/S:  No DVT; c. 12/2016 Echo: Nl RV fxn, nl PASP.  Marland Kitchen Recurrent Metastatic breast cancer ([place])    a. Dx 2016: Stage II, ER positive, PR positive, HER-2/neu overexpressing of the left breast-->chemo/radiation; b. 10/2016 CT Abd/pelvis: diffuse liver mets, ill defined sclerotic bone lesions-T12;  c. 10/2016 MRI brain: metastatic lesion along L temporal lobe (19x7mm) w/ extensive surrounding edema & 83mm midline shift to right.  . Sepsis ([place])   . Sinus tachycardia     PAST SURGICAL HISTORY :   Past Surgical History:  Procedure Laterality Date  . BREAST BIOPSY Left 2016   Positive  . BREAST LUMPECTOMY WITH SENTINEL LYMPH NODE BIOPSY Left 05/23/2015   Procedure: LEFT BREAST WIDE EXCISION WITH AXILLARY DISSECTION, MASTOPLASTY ;  Surgeon: [name], MD;  Location: [facility] ORS;  Service: General;  Laterality: Left;  . BREAST SURGERY Left 12/18/14   breast biopsy/INVASIVE DUCTAL CARCINOMA OF BREAST, NOTTINGHAM GRADE 2.  Marland Kitchen BREAST SURGERY  05/23/2015.   Wide excision/mastoplasty, axillary dissection. No residual invasive cancer, positive for residual DCIS. 0/2 nodes identified  on axillary dissection. (no SLN by technetium or methylene blue)  . CARDIAC CATHETERIZATION    . COLONOSCOPY WITH PROPOFOL N/A 12/10/2016   Procedure: COLONOSCOPY WITH PROPOFOL;  Surgeon: Lucilla Lame, MD;  Location: [facility] ENDOSCOPY;  Service: Endoscopy;  Laterality: N/A;  . COLONOSCOPY WITH PROPOFOL N/A 02/13/2017   Procedure: COLONOSCOPY WITH PROPOFOL;  Surgeon: Lucilla Lame, MD;  Location[facility] END[facility]COPY;  Service: Endoscopy;  Laterality: N/A;  . ESOPHAGOGASTRODUODENOSCOPY N/A 11/03/2019   Procedure: ESOPHAGOGASTRODUODENOSCOPY (EGD);  Surgeon: Lin Landsman, MD;  Location: [facility] ENDOSCOPY;  Service: Gastroenterology;  Laterality:  N/A;  . ESOPHAGOGASTRODUODENOSCOPY (EGD) WITH PROPOFOL N/A 12/08/2016   Procedure: ESOPHAGOGASTRODUODENOSCOPY (EGD) WITH PROPOFOL;  Surgeon: Lucilla Lame, MD;  Location: [facility] ENDOSCOPY;  Service: Endoscopy;  Laterality: N/A;  . IVC FILTER INSERTION N/A 02/15/2017   Procedure: IVC Filter Insertion;  Surgeon: Algernon Huxley, MD;  Location: [place] CV LAB;  Service: Cardiovascular;  Laterality: N/A;  . PORTACATH PLACEMENT Right 12-31-14   Dr Bary Castilla    FAMILY HISTORY :   Family History  Problem Relation Age of Onset  . Breast cancer Maternal Aunt   . Breast cancer Cousin   . Brain cancer Maternal Uncle   . Diabetes Mother   . Hypertension Mother   . Stroke Mother     SOCIAL HISTORY:   Social History   Tobacco Use  . Smoking status: Light Tobacco Smoker    Packs/day: 0.25    Years: 18.00    Pack years: 4.50    Types: Cigarettes  . Smokeless tobacco: Never Used  Vaping Use  . Vaping Use: Never used  Substance Use Topics  . Alcohol use: No    Alcohol/week: 0.0 standard drinks  . Drug use: No    ALLERGIES:  is allergic to no known allergies.  MEDICATIONS:  Current Outpatient Medications  Medication Sig Dispense Refill  . ELIQUIS 5 MG TABS tablet TAKE 1 TABLET BY MOUTH TWICE A DAY 60 tablet 4  . flecainide (TAMBOCOR) 50 MG tablet Take 1 tablet (50 mg total) by mouth 2 (two) times daily. 60 tablet 4  . levETIRAcetam (KEPPRA) 250 MG tablet Take 1 tablet by mouth 2 (two) times daily.     Marland Kitchen levocetirizine (XYZAL) 5 MG tablet Take 1 tablet by mouth daily. (Patient not taking: Reported on 07/18/2020)    . magnesium oxide (MAG-OX) 400 MG tablet TAKE 1 TABLET BY MOUTH TWICE A DAY 60 tablet 4  . meclizine (ANTIVERT) 25 MG tablet Take 1 tablet (25 mg total) by mouth 3 (three) times daily as needed for dizziness. (Patient not taking: Reported on 06/24/2020) 30 tablet 0  . metoprolol tartrate (LOPRESSOR) 25 MG tablet TAKE 1 TABLET BY MOUTH TWICE A DAY 60 tablet 1  . montelukast  (SINGULAIR) 10 MG tablet TAKE 1 TABLET BY MOUTH EVERYDAY AT BEDTIME (Patient not taking: Reported on 07/18/2020) 90 tablet 2  . ondansetron (ZOFRAN) 8 MG tablet One pill every 8 hours as needed for nausea/vomitting. (Patient not taking: Reported on 06/24/2020) 40 tablet 1  . pregabalin (LYRICA) 100 MG capsule TAKE 1 CAPSULE BY MOUTH TWICE A DAY 60 capsule 2  . prochlorperazine (COMPAZINE) 10 MG tablet Take 1 tablet (10 mg total) by mouth every 6 (six) hours as needed for nausea or vomiting. (Patient not taking: Reported on 06/24/2020) 40 tablet 1  . Vitamin D, Ergocalciferol, (DRISDOL) 1.25 MG (50000 UNIT) CAPS capsule TAKE 1 CAPSULE (50,000 UNITS TOTAL) ONCE A WEEK BY MOUTH. 12 capsule 1  No current facility-administered medications for this visit.   Facility-Administered Medications Ordered in Other Visits  Medication Dose Route Frequency Provider Last Rate Last Admin  . 0.9 %  sodium chloride infusion   Intravenous Continuous [name], MD   New Bag at 12/10/16 1140  . 0.9 %  sodium chloride infusion   Intravenous Continuous Lloyd Huger, MD 999 mL/hr at 04/24/15 1520 New Bag at 05/23/15 0951  . 0.9 %  sodium chloride infusion   Intravenous Continuous Verlon Au, NP 999 mL/hr at 10/31/19 1522 New Bag at 10/31/19 1522  . heparin lock flush 100 unit/mL  500 Units Intravenous Once Berenzon, Dmitriy, MD      . sodium chloride 0.9 % injection 10 mL  10 mL Intravenous PRN [name], MD   10 mL at 04/04/15 1440  . sodium chloride flush (NS) 0.9 % injection 10 mL  10 mL Intravenous PRN Berenzon, Dmitriy, MD        PHYSICAL EXAMINATION: ECOG PERFORMANCE STATUS: 1 - Symptomatic but completely ambulatory  BP 108/77 (BP Location: Right Arm, Patient Position: Sitting, Cuff Size: Normal)   Pulse 71   Temp (!) 96.7 F (35.9 C) (Tympanic)   Resp 16   Ht $R'5\' 6"'vH$  (1.676 m)   Wt 131 lb (59.4 kg)   SpO2 100%   BMI 21.14 kg/m   Filed Weights   08/01/20 0909  Weight: 131 lb (59.4  kg)    Physical Exam Constitutional:      Comments: Patient is alone.  In wheel chair.   HENT:     Head: Normocephalic and atraumatic.     Mouth/Throat:     Pharynx: No oropharyngeal exudate.  Eyes:     Pupils: Pupils are equal, round, and reactive to light.  Cardiovascular:     Rate and Rhythm: Normal rate and regular rhythm.  Pulmonary:     Effort: Pulmonary effort is normal. No respiratory distress.     Breath sounds: Normal breath sounds. No wheezing.  Abdominal:     General: Bowel sounds are normal. There is no distension.     Palpations: Abdomen is soft. There is no mass.     Tenderness: There is no abdominal tenderness. There is no guarding or rebound.  Musculoskeletal:        General: No tenderness. Normal range of motion.     Cervical back: Normal range of motion and neck supple.  Skin:    General: Skin is warm.     Comments: Bilateral foot plantar calluses noted.  Tender to palpation.  Neurological:     Mental Status: She is alert and oriented to person, place, and time.     Comments: Broad based gait.  Unsteady.  Psychiatric:        Mood and Affect: Affect normal.     LABORATORY DATA:  I have reviewed the data as listed    Component Value Date/Time   NA 138 08/01/2020 0842   NA 135 03/04/2017 0918   NA 135 01/08/2015 0850   K 4.0 08/01/2020 0842   K 3.7 01/08/2015 0850   CL 103 08/01/2020 0842   CL 101 01/08/2015 0850   CO2 28 08/01/2020 0842   CO2 26 01/08/2015 0850   GLUCOSE 83 08/01/2020 0842   GLUCOSE 161 (H) 01/08/2015 0850   BUN 15 08/01/2020 0842   BUN 8 03/04/2017 0918   BUN 17 01/08/2015 0850   CREATININE 0.50 08/01/2020 0842   CREATININE 0.82 01/22/2015 1559  CALCIUM 8.7 (L) 08/01/2020 0842   CALCIUM 9.3 01/08/2015 0850   PROT 6.6 08/01/2020 0842   PROT 6.4 03/04/2017 0918   PROT 6.8 01/22/2015 1559   ALBUMIN 3.2 (L) 08/01/2020 0842   ALBUMIN 2.5 (L) 03/04/2017 0918   ALBUMIN 3.9 01/22/2015 1559   AST 197 (H) 08/01/2020 0842   AST  34 01/22/2015 1559   ALT 118 (H) 08/01/2020 0842   ALT 42 01/22/2015 1559   ALKPHOS 741 (H) 08/01/2020 0842   ALKPHOS 157 (H) 01/22/2015 1559   BILITOT 1.3 (H) 08/01/2020 0842   BILITOT 6.2 (H) 03/04/2017 0918   BILITOT 0.2 (L) 01/22/2015 1559   GFRNONAA >60 08/01/2020 0842   GFRNONAA >60 01/22/2015 1559   GFRAA >60 06/26/2020 0937   GFRAA >60 01/22/2015 1559    No results found for: SPEP, UPEP  Lab Results  Component Value Date   WBC 2.2 (L) 08/01/2020   NEUTROABS 1.1 (L) 08/01/2020   HGB 13.1 08/01/2020   HCT 38.3 08/01/2020   MCV 97.0 08/01/2020   PLT 189 08/01/2020      Chemistry      Component Value Date/Time   NA 138 08/01/2020 0842   NA 135 03/04/2017 0918   NA 135 01/08/2015 0850   K 4.0 08/01/2020 0842   K 3.7 01/08/2015 0850   CL 103 08/01/2020 0842   CL 101 01/08/2015 0850   CO2 28 08/01/2020 0842   CO2 26 01/08/2015 0850   BUN 15 08/01/2020 0842   BUN 8 03/04/2017 0918   BUN 17 01/08/2015 0850   CREATININE 0.50 08/01/2020 0842   CREATININE 0.82 01/22/2015 1559      Component Value Date/Time   CALCIUM 8.7 (L) 08/01/2020 0842   CALCIUM 9.3 01/08/2015 0850   ALKPHOS 741 (H) 08/01/2020 0842   ALKPHOS 157 (H) 01/22/2015 1559   AST 197 (H) 08/01/2020 0842   AST 34 01/22/2015 1559   ALT 118 (H) 08/01/2020 0842   ALT 42 01/22/2015 1559   BILITOT 1.3 (H) 08/01/2020 0842   BILITOT 6.2 (H) 03/04/2017 0918   BILITOT 0.2 (L) 01/22/2015 1559       RADIOGRAPHIC STUDIES: I have personally reviewed the radiological images as listed and agreed with the findings in the report. No results found.   ASSESSMENT & PLAN:  Carcinoma of upper-outer quadrant of left breast in female, estrogen receptor positive ([place]) # Recurrent metastatic breast cancer-lung/liver metastases ER PR positive HER-2 negative;  SEP 2021- PROGRESSIVE DISEASE- in liver/lungs. On DOXIL q 4 W.  # proceed with cycle #2 [$RemoveB'35mg'IUDFjaMR$ /m2]; Marland Kitchen  Tolerated chemotherapy fairly well.  However, concerning for  progression [as noted on the LFTs]. Will order CT scan after cycle #2; ordered today.   # Gait instability- worse [Hx of LP disease/RT]- currently on PT; s/p fall- again importance of avoiding falls- STABLE.    # cardiomyopathy/ Sinus tachycardia- on metoprolol-/Tamabcor; [SEP  2d-echo- 50-55%]; STABLE.   # Elevated LFTs/alkaline phosphatase 720;-AST/ALT-120s; TB-1.3;   Likely secondary underlying cirrhosis/ chemo/underlying malignancy-worsening-monitor closely. Await Imaging as above.   # Peripheral neuropathy grade 1-2-  on Lyrica-STABLE.   # weight loss/ poor appetite; sec to malignancy/ chemo- STABLE;     # Right lower extremity DVT bilateral [November 28, 2019] CT scan abdomen pelvis negative for any DVT.  On eliquis; STABLE.    # DISPOSITION:  # chemo today.  #  2 weeks MD; labs- cbc/cmp; Possible IVFs over 1 hour- Dr.B #  4 weeks MD; labs- cbc/cmp Doxil  chemo/onpro; CT C/A/P prior-- Dr.B  Addendum: 11/04-called and updated patient's brother-patient overall guarded prognosis; and elevated liver numbers suggestive of progressive disease.  Discussed that awaiting repeat scans in 1 month.  Orders Placed This Encounter  Procedures  . CT CHEST ABDOMEN PELVIS W CONTRAST    Standing Status:   Future    Standing Expiration Date:   08/01/2021    Order Specific Question:   Preferred imaging location?    Answer:   [facility]    Order Specific Question:   Radiology Contrast Protocol - do NOT remove file path    Answer:   [email or web address]   All questions were answered. The patient knows to call the clinic with any problems, questions or concerns.     Cammie Sickle, MD 08/01/2020 7:06 PM

## 2020-08-01 NOTE — Progress Notes (Signed)
Nutrition Follow-up:  Patient with metastatic breast cancer.  Patient currently at [facility].    Met with patient prior to MD visit.  Patient reports that she does not like food at [facility].  Did not eat breakfast this am as did not have time before coming to appointment.  Reports that her brother and friends bring her chicken strips about 2 times per week that she enjoys.  Can't remember what she ate last night for dinner but says ate a few bites of it.  Says she gets a little cup of ensure every day (??medpass). Says she does not keep snacks in her room.      Medications: reviewed  Labs: reviewed  Anthropometrics:   Weight 131 lb today in clinic  132 lb on 10/7 136 lb on 9/9   NUTRITION DIAGNOSIS: Inadequate oral intake continues   INTERVENTION:  RD has been unsuccessful reaching RD at [facility].  Have left another message at [facility] with RD/Dietary Manager. Encouraged patient to make list of snacks/food items that brother can bring to her and she can keep in room. Provided complimentary case of ensure plus to patient today.       MONITORING, EVALUATION, GOAL: weight trends, intake   NEXT VISIT: to be determined  Cordell Guercio B. Zenia Resides, [place], [place] Registered Dietitian [phone] (mobile)

## 2020-08-06 DIAGNOSIS — C7931 Secondary malignant neoplasm of brain: Secondary | ICD-10-CM | POA: Diagnosis not present

## 2020-08-06 DIAGNOSIS — C50412 Malignant neoplasm of upper-outer quadrant of left female breast: Secondary | ICD-10-CM | POA: Diagnosis not present

## 2020-08-06 DIAGNOSIS — Z170 Estrogen receptor positive status [ER+]: Secondary | ICD-10-CM | POA: Diagnosis not present

## 2020-08-14 ENCOUNTER — Non-Acute Institutional Stay: Payer: Medicare HMO | Admitting: Nurse Practitioner

## 2020-08-14 ENCOUNTER — Other Ambulatory Visit: Payer: Self-pay

## 2020-08-14 ENCOUNTER — Encounter: Payer: Self-pay | Admitting: Nurse Practitioner

## 2020-08-14 VITALS — BP 112/54 | Resp 18 | Wt 132.0 lb

## 2020-08-14 DIAGNOSIS — Z515 Encounter for palliative care: Secondary | ICD-10-CM

## 2020-08-14 DIAGNOSIS — C50919 Malignant neoplasm of unspecified site of unspecified female breast: Secondary | ICD-10-CM

## 2020-08-14 NOTE — Progress Notes (Signed)
[facility] Consult Note Telephone: [phone]  Fax: [phone]  PATIENT NAME: Sarah Horne MRN: [id number]  REFERRING PROVIDER:  Dr Hodges/[facility] RESPONSIBLE PARTY:   self  RECOMMENDATIONS and PLAN: 1.ACP:DNR; MOST form completed in [facility]; DNR/Limited interventions, wishes for antibiotics, IVF, no feeding tube, continue with chemotherapy  2.Palliative care encounter; Palliative medicine team will continue to support patient, patient's family, and medical team. Visit consisted of counseling and education dealing with the complex and emotionally intense issues of symptom management and palliative care in the setting of serious and potentially life-threatening illness  3. F/u 2 week for ongoing monitoring of STR, progress with plans once STR completed  I spent 65 minutes providing this consultation,  Start at 11:45am. More than 50% of the time in this consultation was spent coordinating communication.   HISTORY OF PRESENT ILLNESS:  Sarah Horne is a 64 y.o. 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 Sarah Horne continues to reside at [place] at [facility]. Sarah Horne continues to work with therapy, walking short steps with a walker. Sarah Horne does perform adl's with some assistance with bathing, dressing. Sarah Horne does feed  herself. Staff endorses Sarah. Horne appetite is declined with current weigh 132 with BMI 21.3. Sarah. Horne continues to go to the Cancer center for chemotherapy infusions with medical to focus on aggressive interventions treating what is treatable, no recent falls, wounds, infections. Palliative care visit today for follow-up symptoms of pain, appetite, mobility with rehab and long-term care planning. We talked about how she was feeling today. We talked about purpose of Palliative care visit. Sarah. Routson denies pain or shortness of breath. We talked about the work she has been doing with therapy, walking. We talked about recent infusion. We talked about her appetite which has been declined. Sarah Horne endorse is she just does not like the food at the facility brother brings her things once Short-Term rehab is completed. Sarah Horne endorses that she was told that the Social Horne from [facility] would have to further talk with her brother about if he felt she would be able to manage at home. Prior to hospitalization Sarah. Horne was independent at home. Sarah. Horne and I talked about medical goals of care and wishes to continue aggressive treatment at this time. Talked about role of Palliative care with plan of care. We talked about quality of life and residing at the facility. Therapeutic listening and emotional support provided. Discussed with Sarah Horne will continue to follow in monitor while at short-term rehab in addition to continuing Palliative services when she returns home as she previously was a Palliative home care patient. Discussed will follow up in 2 weeks to see her progress with therapy, appetite, symptoms of pain, goals of care and disease progression. Sarah Horne in agreement. I have updated nursing staff no new changes that present time. Will follow up with Sarah Horne for further discussion of planning for discharge once short-term rehab is completed. I have attempted to contact Sarah.  Horne brother  Palliative Care  was asked to help to continue to address goals of care.   CODE STATUS: DNR  PPS: 50% HOSPICE ELIGIBILITY/DIAGNOSIS: TBD  PAST MEDICAL HISTORY:  Past Medical History:  Diagnosis Date  . Chemotherapy-induced peripheral neuropathy ([place])   . Epistaxis    a. 11/2016 in setting of asa/plavix-->silver nitrate cauterization.  . GI bleed    a. 11/2016 Admission w/ GIB and hypovolemic shock req 3u PRBC's;  b. 11/2016 ECG: gastritis & nonbleeding peptic ulcer; c. 11/2016 Conlonoscopy: rectal and sigmoid colonic ulcers.  . Heart attack ([place])    a. 1998 Cath @ [facility]: reportedly no intervention required.  Marland Kitchen Herceptin-induced cardiomyopathy ([place])    a. In the setting of Herceptin Rx for breast cancer (initiated 12/2014); b. 03/2015 MUGA EF 64%; b. 08/2015 MUGA: EF 51%; c. 10/2015 MUGA: EF 44%; d. 11/2015 Echo: EF 45-50%; e. 01/2016 MUGA: EF 60%; f. 06/2016 MUGA EF 65%; g. 10/2016 MUGA: EF 61%;  h. 12/2016 Echo: EF 55-60%, gr1 DD.  Marland Kitchen Hyperlipidemia   . Hypertension   . Neuropathy   . Possible PAD (peripheral artery disease) ([place])    a. 11/2016 LE cyanosis and weak pulses-->CTA w/o significant Ao-BiFem dzs. ? distal dzs-->ASA/Plavix initiated by vascular surgery.  Marland Kitchen PSVT (paroxysmal supraventricular tachycardia) ([place])    a. Dx 11/2016.  . Pulmonary embolism ([place])    a. 12/2016 CTA Chest: small nonocclusive PE in inferior segment of the Left lingula, somewhat eccentric filling defect suggesting chronic rather than acute embolic event; b. 0/2111 LE U/S:  No DVT; c. 12/2016 Echo: Nl RV fxn, nl PASP.  Marland Kitchen Recurrent Metastatic breast cancer ([place])    a. Dx 2016: Stage II, ER positive, PR positive, HER-2/neu overexpressing of the left breast-->chemo/radiation; b. 10/2016 CT Abd/pelvis: diffuse liver mets, ill defined sclerotic bone lesions-T12;  c. 10/2016 MRI brain: metastatic lesion along L temporal lobe (19x15m) w/ extensive surrounding edema & 533mmidline shift to right.  . [place]  .  Sinus tachycardia     SOCIAL HX:  Social History   Tobacco Use  . Smoking status: Light Tobacco Smoker    Packs/day: 0.25    Years: 18.00    Pack years: 4.50    Types: Cigarettes  . Smokeless tobacco: Never Used  Substance Use Topics  . Alcohol use: No    Alcohol/week: 0.0 standard drinks    ALLERGIES:  Allergies  Allergen Reactions  . No Known Allergies      PHYSICAL EXAM:   General: NAD, frail appearing, thin, pleasant female Cardiovascular: regular rate and rhythm Pulmonary: clear ant fields Neurological: ambulates with therapy; generalized weakness  Json Koelzer Z Ihor GullyNP

## 2020-08-15 ENCOUNTER — Encounter: Payer: Self-pay | Admitting: Internal Medicine

## 2020-08-15 ENCOUNTER — Inpatient Hospital Stay (HOSPITAL_BASED_OUTPATIENT_CLINIC_OR_DEPARTMENT_OTHER): Payer: Medicare HMO | Admitting: Internal Medicine

## 2020-08-15 ENCOUNTER — Inpatient Hospital Stay: Payer: Medicare HMO

## 2020-08-15 VITALS — BP 96/78 | HR 77 | Temp 97.4°F | Wt 128.4 lb

## 2020-08-15 DIAGNOSIS — Z170 Estrogen receptor positive status [ER+]: Secondary | ICD-10-CM

## 2020-08-15 DIAGNOSIS — Z95828 Presence of other vascular implants and grafts: Secondary | ICD-10-CM

## 2020-08-15 DIAGNOSIS — F17210 Nicotine dependence, cigarettes, uncomplicated: Secondary | ICD-10-CM | POA: Diagnosis not present

## 2020-08-15 DIAGNOSIS — R634 Abnormal weight loss: Secondary | ICD-10-CM | POA: Diagnosis not present

## 2020-08-15 DIAGNOSIS — C50412 Malignant neoplasm of upper-outer quadrant of left female breast: Secondary | ICD-10-CM

## 2020-08-15 DIAGNOSIS — Z5111 Encounter for antineoplastic chemotherapy: Secondary | ICD-10-CM | POA: Diagnosis not present

## 2020-08-15 DIAGNOSIS — C7931 Secondary malignant neoplasm of brain: Secondary | ICD-10-CM | POA: Diagnosis not present

## 2020-08-15 DIAGNOSIS — Z5189 Encounter for other specified aftercare: Secondary | ICD-10-CM | POA: Diagnosis not present

## 2020-08-15 DIAGNOSIS — R7989 Other specified abnormal findings of blood chemistry: Secondary | ICD-10-CM | POA: Diagnosis not present

## 2020-08-15 DIAGNOSIS — G629 Polyneuropathy, unspecified: Secondary | ICD-10-CM | POA: Diagnosis not present

## 2020-08-15 LAB — COMPREHENSIVE METABOLIC PANEL
ALT: 180 U/L — ABNORMAL HIGH (ref 0–44)
AST: 264 U/L — ABNORMAL HIGH (ref 15–41)
Albumin: 3.1 g/dL — ABNORMAL LOW (ref 3.5–5.0)
Alkaline Phosphatase: 886 U/L — ABNORMAL HIGH (ref 38–126)
Anion gap: 10 (ref 5–15)
BUN: 14 mg/dL (ref 8–23)
CO2: 28 mmol/L (ref 22–32)
Calcium: 8.6 mg/dL — ABNORMAL LOW (ref 8.9–10.3)
Chloride: 99 mmol/L (ref 98–111)
Creatinine, Ser: 0.41 mg/dL — ABNORMAL LOW (ref 0.44–1.00)
GFR, Estimated: >60 mL/min (ref >60)
Glucose, Bld: 119 mg/dL — ABNORMAL HIGH (ref 70–99)
Potassium: 3.6 mmol/L (ref 3.5–5.1)
Sodium: 137 mmol/L (ref 135–145)
Total Bilirubin: 4.6 mg/dL — ABNORMAL HIGH (ref 0.3–1.2)
Total Protein: 6.4 g/dL — ABNORMAL LOW (ref 6.5–8.1)

## 2020-08-15 LAB — CBC WITH DIFFERENTIAL/PLATELET
Abs Immature Granulocytes: 0.06 10*3/uL (ref 0.00–0.07)
Basophils Absolute: 0 10*3/uL (ref 0.0–0.1)
Basophils Relative: 1 %
Eosinophils Absolute: 0 10*3/uL (ref 0.0–0.5)
Eosinophils Relative: 0 %
HCT: 34.7 % — ABNORMAL LOW (ref 36.0–46.0)
Hemoglobin: 12.4 g/dL (ref 12.0–15.0)
Immature Granulocytes: 1 %
Lymphocytes Relative: 9 %
Lymphs Abs: 0.4 10*3/uL — ABNORMAL LOW (ref 0.7–4.0)
MCH: 33 pg (ref 26.0–34.0)
MCHC: 35.7 g/dL (ref 30.0–36.0)
MCV: 92.3 fL (ref 80.0–100.0)
Monocytes Absolute: 0.1 10*3/uL (ref 0.1–1.0)
Monocytes Relative: 2 %
Neutro Abs: 3.9 10*3/uL (ref 1.7–7.7)
Neutrophils Relative %: 87 %
Platelets: 73 10*3/uL — ABNORMAL LOW (ref 150–400)
RBC: 3.76 MIL/uL — ABNORMAL LOW (ref 3.87–5.11)
RDW: 17 % — ABNORMAL HIGH (ref 11.5–15.5)
Smear Review: DECREASED
WBC: 4.6 10*3/uL (ref 4.0–10.5)
nRBC: 0 % (ref 0.0–0.2)

## 2020-08-15 MED ORDER — HEPARIN SOD (PORK) LOCK FLUSH 100 UNIT/ML IV SOLN
500.0000 [IU] | Freq: Once | INTRAVENOUS | Status: AC
  Administered 2020-08-15: 500 [IU] via INTRAVENOUS
  Filled 2020-08-15: qty 5

## 2020-08-15 NOTE — Progress Notes (Signed)
[place] OFFICE PROGRESS NOTE  Patient Care Team: Delsa Grana, PA-C as PCP - General (Family Medicine) Wellington Hampshire, MD as PCP - Cardiology (Cardiology) Bary Castilla, Forest Gleason, MD (General Surgery) Vladimir Crofts, MD as Consulting Physician (Neurology) Cammie Sickle, MD as Consulting Physician (Internal Medicine)  Cancer Staging No matching staging information was found for the patient.   Oncology History Overview Note  # LEFT BREAST IDC; STAGE II [cT2N1] ER >90%; PR- 50-90%; her 2 NEU- POS; s/p Neoadj chemo; AUG 2016- TCH+P s/p Lumpec & partial ALND- path CR; s/p RT [finished Nov 2016];  adj Herceptin; HELD for Jan 19th 2017 [in DEC 2016-EF dropped from 63 to 51%; FEB 27th EF-42.9%]; JAN 2016 START Arimidex; May 2017- EF- 60%; May 24th 2017-Re-start Herceptin q 3W; July 28th STOP herceptin [finished 48m/m2; sec to Low EF; however 2017 Oct EF= 67%; improved]  # DEC 4th 2017- Start Neratinib 4 pills; DEC 11th 3 pills; STOPPED.  # FEB 2018- RECURRENCE ER/PR positive; Her 2 NEGATIVE [liver Bx]  # MARCH 1st 2018- Tax-Cytoxan [poor tol]  # FEB 2018- Brain mets [SBRT; Dr.Crystal; finished Feb 28th 2018]  # March 2018- Eribulin- poor tolerance  # June 8th 2018- Started Faslodex + Abema [abema-multiple interruptions sec to neutropenia]  # MRI Brain- March 2019- Leptomeningeal mets [WBRT; No LP s/p WBRT- 01/09/2018]; STOP Abema [567mday-sec to severe cytopenia]+faslodex  # MAY 20tth 019- Start Xeloda; STOPPED in Nov 22nd 2019- sec to Progressive liver lesions on CT scan  # NOV 26th 2019- Faslodex + Piqaray 25052m; Jan 2020- Piqray 300 mg+ faslodex; FEB 17th CT scan- mixed response/ Overall progression; STOP Piqray + faslodex.   # FEB 25th 2020- ERIBULIN; Poor tolerance sec to severe neutropenia.  Stopped May 2020.  CT scan May 2020-new/progressive 2 cm liver lesion;   #July nd 2020-Ixempra x5 cycles-; NOV 2020-MRI liver- worsening liver lesions [rising  AST/ALT].   # NOV [id number] weekly [consent-]; December 31-2020-progression/rising LFTs.  Stop Taxol  #January 15th, 2021-Adriamycin weekly [C]; MARCH 2021- Partial response. STOP ADRIAMYCIN sec to EF drop 40-45%.   # April 2nd-2021-gemcitabine; 01/15/2020-GEM [800 mg/m2; #3 cycle 1000m13m] q 2 W; with onpro.  September 2021-CT scan progressive disease; discontinue gemcitabine.  September 2020 one 2D echo-50 to 55% ejection fraction  #July 04, 2020-Doxil [35 mg meter square]; on pro; every 4 weeks  #May 06, 2019-MRI brain 5 new lesions [3 mm to 10 mm]-asymptomatic; [previous whole brain radiation]- OCT 2020- Re-Irradiation.   # PN- G-2; may 2017- Cymbalta 60mg75mARCH 2018-  acute vascular insuff of BIL LE [? Taxotere vasoconstriction on asprin]; # hemorragic shock LGIB [s/p colo; Dr.Wohl]  #  SVT- on flecainide.   # April 2016- Liver Bx- NEG;   # Drop in EF from Herceptin [recovered OCt 27th- EF 65%]; March 2020 ejection fraction 40 to 45%.  Stop Adriamycin.  # BMD- jan 2017- osteopenia; #Gait instability/visual disturbance-[Dr.Shah]- S/p EEG [5/13]- negative for seizures.  # 2020- SEP-s/p Pallaitive care evaluation  -----------------------------------------------------------     MOLECULAR TESTING- [facility]- Sep 4th 2018- PDL-1 0%/NEG for BRCA; MSI-Stable; Positive for PI3K; ccnd-1; FGF** -----------------------------------------------------------  Dx: Metastatic Breast cancer [ER/PR-Pos; her -2 NEG] Stage IV; goals: palliative Current treatment-DOXIL  chemotherapy [C]   Carcinoma of upper-outer quadrant of left breast in female, estrogen receptor positive (HCC) [facility]25/2020 - 01/17/2019 Chemotherapy   The patient had eriBULin mesylate (HALAVEN) 2.1 mg in sodium chloride 0.9 % 100 mL chemo infusion, 1.2 mg/m2 = 2.1  mg (100 % of original dose 1.2 mg/m2), Intravenous,  Once, 3 of 4 cycles Dose modification: 1.2 mg/m2 (original dose 1.2 mg/m2, Cycle 1, Reason: Provider  Judgment), 0.8 mg/m2 (original dose 1.2 mg/m2, Cycle 3, Reason: Provider Judgment) Administration: 2.1 mg (11/22/2018), 2.1 mg (11/29/2018), 2.1 mg (12/20/2018), 1.4 mg (01/17/2019)  for chemotherapy treatment.    03/30/2019 - 06/22/2019 Chemotherapy   The patient had pegfilgrastim (NEULASTA ONPRO KIT) injection 6 mg, 6 mg, Subcutaneous, Once, 5 of 8 cycles Administration: 6 mg (03/30/2019), 6 mg (04/20/2019), 6 mg (05/11/2019), 6 mg (06/22/2019), 6 mg (06/01/2019) ixabepilone (IXEMPRA) 60 mg in lactated ringers 250 mL chemo infusion, 33.5 mg/m2 = 57 mg (100 % of original dose 32 mg/m2), Intravenous,  Once, 5 of 8 cycles Dose modification: 32 mg/m2 (original dose 32 mg/m2, Cycle 1, Reason: Provider Judgment) Administration: 72 mg (04/20/2019), 72 mg (05/11/2019), 72 mg (06/22/2019), 72 mg (06/01/2019)  for chemotherapy treatment.    08/18/2019 - 09/15/2019 Chemotherapy   The patient had PACLitaxel (TAXOL) 150 mg in sodium chloride 0.9 % 250 mL chemo infusion (</= 35m/m2), 80 mg/m2 = 150 mg, Intravenous,  Once, 1 of 4 cycles Administration: 150 mg (08/18/2019), 150 mg (09/08/2019), 150 mg (09/15/2019)  for chemotherapy treatment.    10/13/2019 - 11/24/2019 Chemotherapy   The patient had dexamethasone (DECADRON) 4 MG tablet, 8 mg, Oral, Daily, 1 of 1 cycle, Start date: --, End date: -- DOXOrubicin (ADRIAMYCIN) chemo injection 38 mg, 20 mg/m2 = 38 mg, Intravenous,  Once, 6 of 9 cycles Administration: 38 mg (10/13/2019), 38 mg (10/20/2019), 38 mg (10/27/2019), 38 mg (11/17/2019), 38 mg (11/24/2019), 38 mg (11/10/2019) palonosetron (ALOXI) injection 0.25 mg, 0.25 mg, Intravenous,  Once, 6 of 9 cycles Administration: 0.25 mg (10/13/2019), 0.25 mg (10/20/2019), 0.25 mg (10/27/2019), 0.25 mg (11/17/2019), 0.25 mg (11/24/2019), 0.25 mg (11/10/2019)  for chemotherapy treatment.    12/29/2019 - 06/20/2020 Chemotherapy   The patient had pegfilgrastim (NEULASTA ONPRO KIT) injection 6 mg, 6 mg, Subcutaneous, Once, 12 of 18  cycles Administration: 6 mg (01/15/2020), 6 mg (01/29/2020), 6 mg (02/12/2020), 6 mg (02/28/2020), 6 mg (03/14/2020), 6 mg (03/28/2020), 6 mg (04/11/2020), 6 mg (04/25/2020), 6 mg (05/09/2020), 6 mg (05/23/2020), 6 mg (06/06/2020), 6 mg (06/20/2020) gemcitabine (GEMZAR) 1,444 mg in sodium chloride 0.9 % 250 mL chemo infusion, 800 mg/m2 = 1,444 mg, Intravenous,  Once, 13 of 19 cycles Dose modification: 1,000 mg/m2 (original dose 800 mg/m2, Cycle 3, Reason: Provider Judgment) Administration: 1,444 mg (12/29/2019), 1,444 mg (01/15/2020), 1,800 mg (01/29/2020), 1,800 mg (02/12/2020), 1,800 mg (02/28/2020), 1,800 mg (03/14/2020), 1,800 mg (03/28/2020), 1,800 mg (04/11/2020), 1,800 mg (04/25/2020), 1,800 mg (05/09/2020), 1,800 mg (05/23/2020), 1,800 mg (06/06/2020), 1,800 mg (06/20/2020)  for chemotherapy treatment.    07/04/2020 -  Chemotherapy   The patient had pegfilgrastim (NEULASTA ONPRO KIT) injection 6 mg, 6 mg, Subcutaneous, Once, 2 of 4 cycles Administration: 6 mg (07/04/2020), 6 mg (08/01/2020) DOXOrubicin HCL LIPOSOMAL (DOXIL) 60 mg in dextrose 5 % 250 mL chemo infusion, 35 mg/m2 = 60 mg (100 % of original dose 35 mg/m2), Intravenous,  Once, 2 of 4 cycles Dose modification: 35 mg/m2 (original dose 35 mg/m2, Cycle 1, Reason: Provider Judgment) Administration: 60 mg (07/04/2020), 60 mg (08/01/2020)  for chemotherapy treatment.      INTERVAL HISTORY:  Sarah TRAXLER89y.o.  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  Patient received cycle #2 of Doxil ;  day-15.  Patient continues to live in rehab.  States that she might have to go home soon.  She wants to go home but understands the limitations at home.  Her appetite is poor.  Positive for weight loss.  No headaches.  No nausea vomiting.  No rash.  Review of Systems  Constitutional: Positive for malaise/fatigue and weight loss. Negative for chills,  diaphoresis and fever.  HENT: Negative for nosebleeds and sore throat.   Eyes: Negative for double vision.  Respiratory: Negative for cough, hemoptysis, sputum production, shortness of breath and wheezing.   Cardiovascular: Negative for chest pain, palpitations, orthopnea and leg swelling.  Gastrointestinal: Positive for constipation. Negative for abdominal pain, blood in stool, heartburn, melena, nausea and vomiting.  Genitourinary: Negative for dysuria, frequency and urgency.  Musculoskeletal: Positive for joint pain. Negative for back pain.  Skin: Negative for itching.  Neurological: Positive for dizziness and tingling. Negative for focal weakness, weakness and headaches.  Endo/Heme/Allergies: Does not bruise/bleed easily.  Psychiatric/Behavioral: Negative for depression. The patient is not nervous/anxious and does not have insomnia.     PAST MEDICAL HISTORY :  Past Medical History:  Diagnosis Date  . Chemotherapy-induced peripheral neuropathy ([place])   . Epistaxis    a. 11/2016 in setting of asa/plavix-->silver nitrate cauterization.  . GI bleed    a. 11/2016 Admission w/ GIB and hypovolemic shock req 3u PRBC's;  b. 11/2016 ECG: gastritis & nonbleeding peptic ulcer; c. 11/2016 Conlonoscopy: rectal and sigmoid colonic ulcers.  . Heart attack ([place])    a. 1998 Cath @ [facility]: reportedly no intervention required.  Marland Kitchen Herceptin-induced cardiomyopathy ([place])    a. In the setting of Herceptin Rx for breast cancer (initiated 12/2014); b. 03/2015 MUGA EF 64%; b. 08/2015 MUGA: EF 51%; c. 10/2015 MUGA: EF 44%; d. 11/2015 Echo: EF 45-50%; e. 01/2016 MUGA: EF 60%; f. 06/2016 MUGA EF 65%; g. 10/2016 MUGA: EF 61%;  h. 12/2016 Echo: EF 55-60%, gr1 DD.  Marland Kitchen Hyperlipidemia   . Hypertension   . Neuropathy   . Possible PAD (peripheral artery disease) ([place])    a. 11/2016 LE cyanosis and weak pulses-->CTA w/o significant Ao-BiFem dzs. ? distal dzs-->ASA/Plavix initiated by vascular surgery.  Marland Kitchen PSVT (paroxysmal  supraventricular tachycardia) ([place])    a. Dx 11/2016.  . Pulmonary embolism ([place])    a. 12/2016 CTA Chest: small nonocclusive PE in inferior segment of the Left lingula, somewhat eccentric filling defect suggesting chronic rather than acute embolic event; b. 11/4191 LE U/S:  No DVT; c. 12/2016 Echo: Nl RV fxn, nl PASP.  Marland Kitchen Recurrent Metastatic breast cancer ([place])    a. Dx 2016: Stage II, ER positive, PR positive, HER-2/neu overexpressing of the left breast-->chemo/radiation; b. 10/2016 CT Abd/pelvis: diffuse liver mets, ill defined sclerotic bone lesions-T12;  c. 10/2016 MRI brain: metastatic lesion along L temporal lobe (19x70m) w/ extensive surrounding edema & 575mmidline shift to right.  . [place]is [place]  . Sinus tachycardia     PAST SURGICAL HISTORY :   Past Surgical History:  Procedure Laterality Date  . BREAST BIOPSY Left 2016   Positive  . BREAST LUMPECTOMY WITH SENTINEL LYMPH NODE BIOPSY Left 05/23/2015   Procedure: LEFT BREAST WIDE EXCISION WITH AXILLARY DISSECTION, MASTOPLASTY ;  Surgeon: JeRobert BellowMD;  Location: [facility] ORS;  Service: General;  Laterality: Left;  . BREAST SURGERY Left 12/18/14   breast biopsy/INVASIVE DUCTAL CARCINOMA OF BREAST, NOTTINGHAM GRADE 2.  . Marland KitchenREAST SURGERY  05/23/2015.   Wide excision/mastoplasty, axillary dissection.  No residual invasive cancer, positive for residual DCIS. 0/2 nodes identified on axillary dissection. (no SLN by technetium or methylene blue)  . CARDIAC CATHETERIZATION    . COLONOSCOPY WITH PROPOFOL N/A 12/10/2016   Procedure: COLONOSCOPY WITH PROPOFOL;  Surgeon: Lucilla Lame, MD;  Location: [facility] ENDOSCOPY;  Service: Endoscopy;  Laterality: N/A;  . COLONOSCOPY WITH PROPOFOL N/A 02/13/2017   Procedure: COLONOSCOPY WITH PROPOFOL;  Surgeon: Lucilla Lame, MD;  Location: [facility] ENDOSCOPY;  Service: Endoscopy;  Laterality: N/A;  . ESOPHAGOGASTRODUODENOSCOPY N/A 11/03/2019   Procedure: ESOPHAGOGASTRODUODENOSCOPY (EGD);  Surgeon: Lin Landsman, MD;   Location[facility] ENDOSCOPY;  Service: Gastroenterology;  Laterality: N/A;  . ESOPHAGOGASTRODUODENOSCOPY (EGD) WITH PROPOFOL N/A 12/08/2016   Procedure: ESOPHAGOGASTRODUODENOSCOPY (EGD) WITH PROPOFOL;  Surgeon: Lucilla Lame, MD;  Location: [facility] ENDOSCOPY;  Service: Endoscopy;  Laterality: N/A;  . IVC FILTER INSERTION N/A 02/15/2017   Procedure: IVC Filter Insertion;  Surgeon: Algernon Huxley, MD;  Location: [facility];  Service: Cardiovascular;  Laterality: N/A;  . PORTACATH PLACEMENT Right 12-31-14   Dr Bary Castilla    FAMILY HISTORY :   Family History  Problem Relation Age of Onset  . Breast cancer Maternal Aunt   . Breast cancer Cousin   . Brain cancer Maternal Uncle   . Diabetes Mother   . Hypertension Mother   . Stroke Mother     SOCIAL HISTORY:   Social History   Tobacco Use  . Smoking status: Light Tobacco Smoker    Packs/day: 0.25    Years: 18.00    Pack years: 4.50    Types: Cigarettes  . Smokeless tobacco: Never Used  Vaping Use  . Vaping Use: Never used  Substance Use Topics  . Alcohol use: No    Alcohol/week: 0.0 standard drinks  . Drug use: No    ALLERGIES:  is allergic to no known allergies.  MEDICATIONS:  Current Outpatient Medications  Medication Sig Dispense Refill  . ELIQUIS 5 MG TABS tablet TAKE 1 TABLET BY MOUTH TWICE A DAY 60 tablet 4  . flecainide (TAMBOCOR) 50 MG tablet Take 1 tablet (50 mg total) by mouth 2 (two) times daily. 60 tablet 4  . magnesium oxide (MAG-OX) 400 MG tablet TAKE 1 TABLET BY MOUTH TWICE A DAY 60 tablet 4  . metoprolol tartrate (LOPRESSOR) 25 MG tablet TAKE 1 TABLET BY MOUTH TWICE A DAY 60 tablet 1  . pregabalin (LYRICA) 100 MG capsule TAKE 1 CAPSULE BY MOUTH TWICE A DAY 60 capsule 2  . Vitamin D, Ergocalciferol, (DRISDOL) 1.25 MG (50000 UNIT) CAPS capsule TAKE 1 CAPSULE (50,000 UNITS TOTAL) ONCE A WEEK BY MOUTH. 12 capsule 1  . levETIRAcetam (KEPPRA) 250 MG tablet Take 1 tablet by mouth 2 (two) times daily.     Marland Kitchen levocetirizine  (XYZAL) 5 MG tablet Take 1 tablet by mouth daily. (Patient not taking: Reported on 07/18/2020)    . meclizine (ANTIVERT) 25 MG tablet Take 1 tablet (25 mg total) by mouth 3 (three) times daily as needed for dizziness. (Patient not taking: Reported on 06/24/2020) 30 tablet 0  . montelukast (SINGULAIR) 10 MG tablet TAKE 1 TABLET BY MOUTH EVERYDAY AT BEDTIME (Patient not taking: Reported on 07/18/2020) 90 tablet 2  . ondansetron (ZOFRAN) 8 MG tablet One pill every 8 hours as needed for nausea/vomitting. (Patient not taking: Reported on 06/24/2020) 40 tablet 1  . prochlorperazine (COMPAZINE) 10 MG tablet Take 1 tablet (10 mg total) by mouth every 6 (six) hours as needed for nausea or vomiting. (  Patient not taking: Reported on 06/24/2020) 40 tablet 1   No current facility-administered medications for this visit.   Facility-Administered Medications Ordered in Other Visits  Medication Dose Route Frequency Provider Last Rate Last Admin  . 0.9 %  sodium chloride infusion   Intravenous Continuous [name], MD   New Bag at 12/10/16 1140  . 0.9 %  sodium chloride infusion   Intravenous Continuous Lloyd Huger, MD 999 mL/hr at 04/24/15 1520 New Bag at 05/23/15 0951  . 0.9 %  sodium chloride infusion   Intravenous Continuous Verlon Au, NP 999 mL/hr at 10/31/19 1522 New Bag at 10/31/19 1522  . heparin lock flush 100 unit/mL  500 Units Intravenous Once Berenzon, Dmitriy, MD      . sodium chloride 0.9 % injection 10 mL  10 mL Intravenous PRN [name], MD   10 mL at 04/04/15 1440  . sodium chloride flush (NS) 0.9 % injection 10 mL  10 mL Intravenous PRN Berenzon, Dmitriy, MD        PHYSICAL EXAMINATION: ECOG PERFORMANCE STATUS: 1 - Symptomatic but completely ambulatory  BP 96/78 (BP Location: Right Arm, Patient Position: Sitting)   Pulse 77   Temp (!) 97.4 F (36.3 C) (Tympanic)   Wt 128 lb 6.4 oz (58.2 kg)   SpO2 100%   BMI 20.72 kg/m   Filed Weights   08/15/20 0942  Weight:  128 lb 6.4 oz (58.2 kg)    Physical Exam Constitutional:      Comments: Patient is alone.  In wheel chair.   HENT:     Head: Normocephalic and atraumatic.     Mouth/Throat:     Pharynx: No oropharyngeal exudate.  Eyes:     Pupils: Pupils are equal, round, and reactive to light.  Cardiovascular:     Rate and Rhythm: Normal rate and regular rhythm.  Pulmonary:     Effort: Pulmonary effort is normal. No respiratory distress.     Breath sounds: Normal breath sounds. No wheezing.  Abdominal:     General: Bowel sounds are normal. There is no distension.     Palpations: Abdomen is soft. There is no mass.     Tenderness: There is no abdominal tenderness. There is no guarding or rebound.  Musculoskeletal:        General: No tenderness. Normal range of motion.     Cervical back: Normal range of motion and neck supple.  Skin:    General: Skin is warm.     Comments: Bilateral foot plantar calluses noted.  Tender to palpation.  Neurological:     Mental Status: She is alert and oriented to person, place, and time.     Comments: Broad based gait.  Unsteady.  Psychiatric:        Mood and Affect: Affect normal.     LABORATORY DATA:  I have reviewed the data as listed    Component Value Date/Time   NA 137 08/15/2020 0923   NA 135 03/04/2017 0918   NA 135 01/08/2015 0850   K 3.6 08/15/2020 0923   K 3.7 01/08/2015 0850   CL 99 08/15/2020 0923   CL 101 01/08/2015 0850   CO2 28 08/15/2020 0923   CO2 26 01/08/2015 0850   GLUCOSE 119 (H) 08/15/2020 0923   GLUCOSE 161 (H) 01/08/2015 0850   BUN 14 08/15/2020 0923   BUN 8 03/04/2017 0918   BUN 17 01/08/2015 0850   CREATININE 0.41 (L) 08/15/2020 0923   CREATININE 0.82 01/22/2015  1559   CALCIUM 8.6 (L) 08/15/2020 0923   CALCIUM 9.3 01/08/2015 0850   PROT 6.4 (L) 08/15/2020 0923   PROT 6.4 03/04/2017 0918   PROT 6.8 01/22/2015 1559   ALBUMIN 3.1 (L) 08/15/2020 0923   ALBUMIN 2.5 (L) 03/04/2017 0918   ALBUMIN 3.9 01/22/2015 1559   AST  264 (H) 08/15/2020 0923   AST 34 01/22/2015 1559   ALT 180 (H) 08/15/2020 0923   ALT 42 01/22/2015 1559   ALKPHOS 886 (H) 08/15/2020 0923   ALKPHOS 157 (H) 01/22/2015 1559   BILITOT 4.6 (H) 08/15/2020 0923   BILITOT 6.2 (H) 03/04/2017 0918   BILITOT 0.2 (L) 01/22/2015 1559   GFRNONAA >60 08/15/2020 0923   GFRNONAA >60 01/22/2015 1559   GFRAA >60 06/26/2020 0937   GFRAA >60 01/22/2015 1559    No results found for: SPEP, UPEP  Lab Results  Component Value Date   WBC 4.6 08/15/2020   NEUTROABS 3.9 08/15/2020   HGB 12.4 08/15/2020   HCT 34.7 (L) 08/15/2020   MCV 92.3 08/15/2020   PLT 73 (L) 08/15/2020      Chemistry      Component Value Date/Time   NA 137 08/15/2020 0923   NA 135 03/04/2017 0918   NA 135 01/08/2015 0850   K 3.6 08/15/2020 0923   K 3.7 01/08/2015 0850   CL 99 08/15/2020 0923   CL 101 01/08/2015 0850   CO2 28 08/15/2020 0923   CO2 26 01/08/2015 0850   BUN 14 08/15/2020 0923   BUN 8 03/04/2017 0918   BUN 17 01/08/2015 0850   CREATININE 0.41 (L) 08/15/2020 0923   CREATININE 0.82 01/22/2015 1559      Component Value Date/Time   CALCIUM 8.6 (L) 08/15/2020 0923   CALCIUM 9.3 01/08/2015 0850   ALKPHOS 886 (H) 08/15/2020 0923   ALKPHOS 157 (H) 01/22/2015 1559   AST 264 (H) 08/15/2020 0923   AST 34 01/22/2015 1559   ALT 180 (H) 08/15/2020 0923   ALT 42 01/22/2015 1559   BILITOT 4.6 (H) 08/15/2020 0923   BILITOT 6.2 (H) 03/04/2017 0918   BILITOT 0.2 (L) 01/22/2015 1559       RADIOGRAPHIC STUDIES: I have personally reviewed the radiological images as listed and agreed with the findings in the report. No results found.   ASSESSMENT & PLAN:  Carcinoma of upper-outer quadrant of left breast in female, estrogen receptor positive ([place]) # Recurrent metastatic breast cancer-lung/liver metastases ER PR positive HER-2 negative;  SEP 2021- PROGRESSIVE DISEASE- in liver/lungs. On DOXIL q 4 W.  # s/p DOXIL  [47m/m2];cycle #2;day-15 .  Tolerated chemotherapy  fairly well.  However, concerning for progression [as noted on the LFTs]. CT pending prior to next cycle.  # Gait instability- worse [Hx of LP disease/RT]- currently on PT; s/p fall- again importance of avoiding falls-  STABLE.   # cardiomyopathy/ Sinus tachycardia- on metoprolol-/Tamabcor; [SEP  2d-echo- 50-55%]; STABLE.   # Elevated LFTs/alkaline phosphatase 720;-AST/ALT-120s; TB-1.3;   Likely secondary underlying cirrhosis/ chemo/underlying malignancy-worsening-monitor closely. Await Imaging as above.   # Peripheral neuropathy grade 1-2-  on Lyrica-STABLE.   # weight loss/ poor appetite; sec to malignancy/ chemo; worsening.  Await above imaging.  # Right lower extremity DVT bilateral [November 28, 2019] CT scan abdomen pelvis negative for any DVT.  On eliquis; STABLE.  #Discussed with the patient that clinically it appears that her malignancy is progressing; and unfortunately given her declining performance status disease burden; lack of good therapeutic options, best  supportive care/hospice would be most reasonable.  # DISPOSITION:  # NO IVFs today; de-access # follow up in 2 weeks- as planned- MD; labs- cbc/cmp Doxil chemo/onpro; CT C/A/P prior-- Dr.B   No orders of the defined types were placed in this encounter.  All questions were answered. The patient knows to call the clinic with any problems, questions or concerns.     Cammie Sickle, MD 08/16/2020 7:37 AM

## 2020-08-15 NOTE — Assessment & Plan Note (Addendum)
#  Recurrent metastatic breast cancer-lung/liver metastases ER PR positive HER-2 negative;  SEP 2021- PROGRESSIVE DISEASE- in liver/lungs. On DOXIL q 4 W.  # s/p DOXIL  [$Remov'35mg'ICZlno$ /m2];cycle #2;day-15 .  Tolerated chemotherapy fairly well.  However, concerning for progression [as noted on the LFTs]. CT pending prior to next cycle.  # Gait instability- worse [Hx of LP disease/RT]- currently on PT; s/p fall- again importance of avoiding falls-  STABLE.   # cardiomyopathy/ Sinus tachycardia- on metoprolol-/Tamabcor; [SEP  2d-echo- 50-55%]; STABLE.   # Elevated LFTs/alkaline phosphatase 720;-AST/ALT-120s; TB-1.3;   Likely secondary underlying cirrhosis/ chemo/underlying malignancy-worsening-monitor closely. Await Imaging as above.   # Peripheral neuropathy grade 1-2-  on Lyrica-STABLE.   # weight loss/ poor appetite; sec to malignancy/ chemo; worsening.  Await above imaging.  # Right lower extremity DVT bilateral [November 28, 2019] CT scan abdomen pelvis negative for any DVT.  On eliquis; STABLE.  #Discussed with the patient that clinically it appears that her malignancy is progressing; and unfortunately given her declining performance status disease burden; lack of good therapeutic options, best supportive care/hospice would be most reasonable.  # DISPOSITION:  # NO IVFs today; de-access # follow up in 2 weeks- as planned- MD; labs- cbc/cmp Doxil chemo/onpro; CT C/A/P prior-- Dr.B

## 2020-08-16 ENCOUNTER — Telehealth: Payer: Self-pay | Admitting: *Deleted

## 2020-08-16 ENCOUNTER — Telehealth: Payer: Self-pay | Admitting: Hospice and Palliative Medicine

## 2020-08-16 NOTE — Telephone Encounter (Signed)
I spoke with patient and Education officer, museum at the facility.  Patient recognizes that she most likely has progressive cancer and probably would not have additional treatment options.  However, she would like to pursue CT for confirmation.  Plan is to discharge her home from rehab on Sunday.  Will see if we can move up CT to early next week allowing for earlier hospice involvement.

## 2020-08-16 NOTE — Telephone Encounter (Signed)
I returned call to Mr Sarah Horne with [facility] and left message on his voice mail that patient brother requested Hospice with [facility]

## 2020-08-16 NOTE — Telephone Encounter (Signed)
Josh- can you please check on hospice for this pt.? Pt is hospice appropriate; but wanted to make sure this was discussed with pt/brother. Thanks GB

## 2020-08-16 NOTE — Telephone Encounter (Signed)
Freddie Breech with [facility] called asking for a referral for this patient so that she can be opened to hospice services this Sunday. Please return his call [phone]

## 2020-08-16 NOTE — Telephone Encounter (Signed)
I attempted to call patient but was unable to reach her.  I called and spoke with patient's brother.  He understands that patient likely has progressive malignancy.  Patient is currently plan to discharge home from [facility] on Sunday.  Brother says that he would be interested in hospice involvement at time of discharge from rehab.  He would be okay with foregoing CT scan and further work-up/treatment and just focusing on comfort at home.  Brother verbalized that he is not interested in [facility] and would prefer [facility] given that they have a [place] should patient require that in the future. I sent a referral for hospice to [facility]. I also called and updated Christin Gusler, NP.

## 2020-08-19 ENCOUNTER — Telehealth: Payer: Self-pay | Admitting: Internal Medicine

## 2020-08-19 ENCOUNTER — Other Ambulatory Visit: Payer: Self-pay

## 2020-08-19 ENCOUNTER — Ambulatory Visit: Payer: Medicare HMO

## 2020-08-19 ENCOUNTER — Ambulatory Visit
Admission: RE | Admit: 2020-08-19 | Discharge: 2020-08-19 | Disposition: A | Discharge status: Home / Self Care | Payer: Medicare HMO | Source: Ambulatory Visit | Attending: Internal Medicine | Admitting: Internal Medicine

## 2020-08-19 ENCOUNTER — Telehealth: Payer: Self-pay | Admitting: *Deleted

## 2020-08-19 DIAGNOSIS — K7689 Other specified diseases of liver: Secondary | ICD-10-CM | POA: Diagnosis not present

## 2020-08-19 DIAGNOSIS — C50412 Malignant neoplasm of upper-outer quadrant of left female breast: Secondary | ICD-10-CM | POA: Diagnosis not present

## 2020-08-19 DIAGNOSIS — M8789 Other osteonecrosis, multiple sites: Secondary | ICD-10-CM | POA: Diagnosis not present

## 2020-08-19 DIAGNOSIS — K7460 Unspecified cirrhosis of liver: Secondary | ICD-10-CM | POA: Diagnosis not present

## 2020-08-19 DIAGNOSIS — C7951 Secondary malignant neoplasm of bone: Secondary | ICD-10-CM | POA: Diagnosis not present

## 2020-08-19 DIAGNOSIS — Z170 Estrogen receptor positive status [ER+]: Secondary | ICD-10-CM | POA: Insufficient documentation

## 2020-08-19 DIAGNOSIS — J984 Other disorders of lung: Secondary | ICD-10-CM | POA: Diagnosis not present

## 2020-08-19 DIAGNOSIS — C50919 Malignant neoplasm of unspecified site of unspecified female breast: Secondary | ICD-10-CM | POA: Diagnosis not present

## 2020-08-19 MED ORDER — IOHEXOL 300 MG/ML  SOLN
100.0000 mL | Freq: Once | INTRAMUSCULAR | Status: AC | PRN
  Administered 2020-08-19: 100 mL via INTRAVENOUS

## 2020-08-19 NOTE — Telephone Encounter (Signed)
On 11/19-spoke with [place] regarding moving the scans sooner make decision regarding hospice.  That is reasonable.  Discussed with patient's brother Marlou Sa regarding above plan; he is in agreement.  Understands that patients prognosis extremely poor/and hospice is appropriate.

## 2020-08-19 NOTE — Telephone Encounter (Signed)
IMPRESSION: Worsening of hepatic metastatic disease on a background of pseudo cirrhosis.  Numerous bony metastatic lesions with increasing conspicuity and with new lesions since previous imaging including lytic lesions in the L2 vertebral body which were not present on the prior study. Diffuse nature of skeletal metastases was better demonstrated on the previous bone scan and may have worsened even since this study. RIGHT pubic bone is at risk for pathologic fracture.  Increasing visualization of sclerotic foci could be seen in the setting of flare phenomenon/response to therapy but the presence of new lytic lesions and hepatic metastatic disease is compatible with worsening of disease.  Question of RIGHT colonic thickening and mild pericolonic stranding along the ascending colon and hepatic flexure that associated with the cecal tip. Findings raise the question of colitis. Appendix not visualized but the bulk of inflammatory changes are distant from the cecal tip. Correlate with any abdominal symptoms.  Cavitary focus in the LEFT upper lobe 3.2 x 2.7 cm previously 3.3 x 2.8 cm. Internal material is unchanged. Possibility of lymphangitic tumor is considered no finding is unchanged.  Aortic atherosclerosis.  Aortic Atherosclerosis (ICD10-I70.0).

## 2020-08-19 NOTE — Telephone Encounter (Signed)
On 11/22-I spoke to patient regarding the results of CT scan progressive disease.  Would not recommend any further therapy given declining performance status/liver dysfunction.  Recommend hospice.  Patient agreement.  Also spoke to patient's brother-regarding above recommendations.  They are in agreement.  C-please cancel follow-up MD appointment; labs/infusion.  Patient in hospice.

## 2020-08-19 NOTE — Telephone Encounter (Signed)
I called and spoke with patient by phone.  Updated her on the results of the CT scan.  Patient had some confusion about future treatment but I again discussed the role of hospice in keeping her comfortable.  The hospice nurse was actually present at the time of my call and I also spoke with her by phone.  Patient was started this afternoon on hospice care.

## 2020-08-19 NOTE — Telephone Encounter (Signed)
Josh/ Dr. B Please review this call report. 1443 pm from radiology.

## 2020-08-20 ENCOUNTER — Other Ambulatory Visit: Payer: Self-pay

## 2020-08-20 NOTE — Patient Outreach (Signed)
[facility]) Care Management  08/20/2020  ITHZEL FEDORCHAK [birth year] [id number]     Transition of Care Referral  Referral Date: 08/20/2020 Referral Source: [facility] Discharge Report Date of Discharge: 08/18/2020 Facility: [facility] Insurance: [facility]    Referral received. Upon chart review noted that patient is active with hospice services. [facility] does not follow hospice patients.    Plan: RN CM will close case at this time.   Enzo Montgomery, RN,BSN,CCM [place] Management Telephonic Care Management Coordinator Direct Phone: [phone] Toll Free: [phone] Fax: [phone]

## 2020-08-26 ENCOUNTER — Ambulatory Visit: Payer: Medicare HMO

## 2020-08-27 ENCOUNTER — Ambulatory Visit: Payer: Medicare HMO

## 2020-08-28 ENCOUNTER — Other Ambulatory Visit: Payer: Self-pay | Admitting: Family Medicine

## 2020-08-28 DIAGNOSIS — J309 Allergic rhinitis, unspecified: Secondary | ICD-10-CM

## 2020-08-29 ENCOUNTER — Encounter: Payer: Medicare HMO | Admitting: Hospice and Palliative Medicine

## 2020-08-29 ENCOUNTER — Ambulatory Visit: Payer: Medicare HMO

## 2020-08-29 ENCOUNTER — Other Ambulatory Visit: Payer: Medicare HMO

## 2020-08-29 ENCOUNTER — Ambulatory Visit: Payer: Medicare HMO | Admitting: Internal Medicine

## 2020-09-02 ENCOUNTER — Ambulatory Visit: Payer: Medicare HMO | Admitting: Podiatry

## 2020-09-04 ENCOUNTER — Other Ambulatory Visit: Payer: Self-pay | Admitting: Internal Medicine

## 2020-09-06 ENCOUNTER — Telehealth: Payer: Self-pay | Admitting: Hospice and Palliative Medicine

## 2020-09-06 NOTE — Telephone Encounter (Signed)
I received a call from patient's hospice nurse. Pregabalin is not on their formulary. Okay to rotate from pregabalin to gabapentin 100 mg twice daily.

## 2020-09-10 ENCOUNTER — Ambulatory Visit: Payer: Medicare HMO | Admitting: Cardiovascular Disease

## 2020-09-10 NOTE — Progress Notes (Deleted)
Cardiology Office Note   Date:  09/10/2020   ID:  Sarah Horne, DOB [birth year], MRN [id number]  PCP:  Delsa Grana, PA-C  Cardiologist:  Kathlyn Sacramento, MD   No chief complaint on file.     History of Present Illness: Sarah Horne is a 64 y.o. female who presents for ***  regarding Herceptin-induced cardiomyopathy and paroxysmal supraventricular tachycardia. The patient reports possible prior myocardial infarction in 1998 where she underwent cardiac catheterization at [facility]. She did not require revascularization and she was treated medically. The details of that admission are not available. She has chronic medical conditions that include hypertension, hyperlipidemia and tobacco use .   She was diagnosed with metastatic breast cancer, with metastisis to liver and brain in 2016 for which she underwent chemotherapy. She developed chemo-induced cardiomyopathy related to past treatment of breast cancer with Herceptin in 2017. Her EF was reduced to 45% during treatment, however subsequent echos showed improvement of EF to baseline after treatment. Latest echo in 12/2016 showed and EF of 55-60% with Grade 1 DD.  SVT has been well controlled with flecainide and metoprolol.  She has been stable from a cardiac standpoint but unfortunately, she continues to deal with metastatic breast cancer and currently undergoing chemotherapy.  She was diagnosed with DVT and currently on anticoagulation with Eliquis. She denies chest pain, shortness of breath or palpitations.    Past Medical History:  Diagnosis Date  . Chemotherapy-induced peripheral neuropathy ([place])   . Epistaxis    a. 11/2016 in setting of asa/plavix-->silver nitrate cauterization.  . GI bleed    a. 11/2016 Admission w/ GIB and hypovolemic shock req 3u PRBC's;  b. 11/2016 ECG: gastritis & nonbleeding peptic ulcer; c. 11/2016 Conlonoscopy: rectal and sigmoid colonic ulcers.  . Heart attack ([place])    a. 1998 Cath @ [facility]: reportedly no  intervention required.  Marland Kitchen Herceptin-induced cardiomyopathy ([place])    a. In the setting of Herceptin Rx for breast cancer (initiated 12/2014); b. 03/2015 MUGA EF 64%; b. 08/2015 MUGA: EF 51%; c. 10/2015 MUGA: EF 44%; d. 11/2015 Echo: EF 45-50%; e. 01/2016 MUGA: EF 60%; f. 06/2016 MUGA EF 65%; g. 10/2016 MUGA: EF 61%;  h. 12/2016 Echo: EF 55-60%, gr1 DD.  Marland Kitchen Hyperlipidemia   . Hypertension   . Neuropathy   . Possible PAD (peripheral artery disease) ([place])    a. 11/2016 LE cyanosis and weak pulses-->CTA w/o significant Ao-BiFem dzs. ? distal dzs-->ASA/Plavix initiated by vascular surgery.  Marland Kitchen PSVT (paroxysmal supraventricular tachycardia) ([place])    a. Dx 11/2016.  . Pulmonary embolism ([place])    a. 12/2016 CTA Chest: small nonocclusive PE in inferior segment of the Left lingula, somewhat eccentric filling defect suggesting chronic rather than acute embolic event; b. 04/1274 LE U/S:  No DVT; c. 12/2016 Echo: Nl RV fxn, nl PASP.  Marland Kitchen Recurrent Metastatic breast cancer ([place])    a. Dx 2016: Stage II, ER positive, PR positive, HER-2/neu overexpressing of the left breast-->chemo/radiation; b. 10/2016 CT Abd/pelvis: diffuse liver mets, ill defined sclerotic bone lesions-T12;  c. 10/2016 MRI brain: metastatic lesion along L temporal lobe (19x92m) w/ extensive surrounding edema & 567mmidline shift to right.  . [place]epsis [place]  . Sinus tachycardia     Past Surgical History:  Procedure Laterality Date  . BREAST BIOPSY Left 2016   Positive  . BREAST LUMPECTOMY WITH SENTINEL LYMPH NODE BIOPSY Left 05/23/2015   Procedure: LEFT BREAST WIDE EXCISION WITH AXILLARY DISSECTION, MASTOPLASTY ;  Surgeon:  [name], MD;  Location: [facility] ORS;  Service: General;  Laterality: Left;  . BREAST SURGERY Left 12/18/14   breast biopsy/INVASIVE DUCTAL CARCINOMA OF BREAST, NOTTINGHAM GRADE 2.  Marland Kitchen BREAST SURGERY  05/23/2015.   Wide excision/mastoplasty, axillary dissection. No residual invasive cancer, positive for residual DCIS. 0/2 nodes  identified on axillary dissection. (no SLN by technetium or methylene blue)  . CARDIAC CATHETERIZATION    . COLONOSCOPY WITH PROPOFOL N/A 12/10/2016   Procedure: COLONOSCOPY WITH PROPOFOL;  Surgeon: Lucilla Lame, MD;  Location: [facility] ENDOSCOPY;  Service: Endoscopy;  Laterality: N/A;  . COLONOSCOPY WITH PROPOFOL N/A 02/13/2017   Procedure: COLONOSCOPY WITH PROPOFOL;  Surgeon: Lucilla Lame, MD;  Location: [facility] ENDOSCOPY;  Service: Endoscopy;  Laterality: N/A;  . ESOPHAGOGASTRODUODENOSCOPY N/A 11/03/2019   Procedure: ESOPHAGOGASTRODUODENOSCOPY (EGD);  Surgeon: Lin Landsman, MD;  Location: [facility] ENDOSCOPY;  Service: Gastroenterology;  Laterality: N/A;  . ESOPHAGOGASTRODUODENOSCOPY (EGD) WITH PROPOFOL N/A 12/08/2016   Procedure: ESOPHAGOGASTRODUODENOSCOPY (EGD) WITH PROPOFOL;  Surgeon: Lucilla Lame, MD;  Location: [facility] ENDOSCOPY;  Service: Endoscopy;  Laterality: N/A;  . IVC FILTER INSERTION N/A 02/15/2017   Procedure: IVC Filter Insertion;  Surgeon: Algernon Huxley, MD;  Location: [facility];  Service: Cardiovascular;  Laterality: N/A;  . PORTACATH PLACEMENT Right 12-31-14   Dr Bary Castilla     Current Outpatient Medications  Medication Sig Dispense Refill  . levocetirizine (XYZAL) 5 MG tablet TAKE 1 TABLET BY MOUTH EVERY DAY IN THE EVENING 90 tablet 1  . ELIQUIS 5 MG TABS tablet TAKE 1 TABLET BY MOUTH TWICE A DAY 60 tablet 4  . flecainide (TAMBOCOR) 50 MG tablet Take 1 tablet (50 mg total) by mouth 2 (two) times daily. 60 tablet 4  . levETIRAcetam (KEPPRA) 250 MG tablet Take 1 tablet by mouth 2 (two) times daily.     . magnesium oxide (MAG-OX) 400 MG tablet TAKE 1 TABLET BY MOUTH TWICE A DAY 60 tablet 4  . meclizine (ANTIVERT) 25 MG tablet Take 1 tablet (25 mg total) by mouth 3 (three) times daily as needed for dizziness. (Patient not taking: Reported on 06/24/2020) 30 tablet 0  . metoprolol tartrate (LOPRESSOR) 25 MG tablet TAKE 1 TABLET BY MOUTH TWICE A DAY 60 tablet 1  . montelukast (SINGULAIR)  10 MG tablet TAKE 1 TABLET BY MOUTH EVERYDAY AT BEDTIME (Patient not taking: Reported on 07/18/2020) 90 tablet 2  . ondansetron (ZOFRAN) 8 MG tablet One pill every 8 hours as needed for nausea/vomitting. (Patient not taking: Reported on 06/24/2020) 40 tablet 1  . pregabalin (LYRICA) 100 MG capsule TAKE 1 CAPSULE BY MOUTH TWICE A DAY 60 capsule 2  . prochlorperazine (COMPAZINE) 10 MG tablet Take 1 tablet (10 mg total) by mouth every 6 (six) hours as needed for nausea or vomiting. (Patient not taking: Reported on 06/24/2020) 40 tablet 1  . Vitamin D, Ergocalciferol, (DRISDOL) 1.25 MG (50000 UNIT) CAPS capsule TAKE 1 CAPSULE (50,000 UNITS TOTAL) ONCE A WEEK BY MOUTH. 12 capsule 1   No current facility-administered medications for this visit.   Facility-Administered Medications Ordered in Other Visits  Medication Dose Route Frequency Provider Last Rate Last Admin  . 0.9 %  sodium chloride infusion   Intravenous Continuous [name], MD   New Bag at 12/10/16 1140  . 0.9 %  sodium chloride infusion   Intravenous Continuous Lloyd Huger, MD 999 mL/hr at 04/24/15 1520 New Bag at 05/23/15 0951  . 0.9 %  sodium chloride infusion   Intravenous Continuous  Verlon Au, NP 999 mL/hr at 10/31/19 1522 New Bag at 10/31/19 1522  . heparin lock flush 100 unit/mL  500 Units Intravenous Once Berenzon, Dmitriy, MD      . sodium chloride 0.9 % injection 10 mL  10 mL Intravenous PRN [name], MD   10 mL at 04/04/15 1440  . sodium chloride flush (NS) 0.9 % injection 10 mL  10 mL Intravenous PRN Berenzon, Dmitriy, MD        Allergies:   No known allergies    Social History:  The patient  reports that she has been smoking cigarettes. She has a 4.50 pack-year smoking history. She has never used smokeless tobacco. She reports that she does not drink alcohol and does not use drugs.   Family History:  The patient's ***family history includes Brain cancer in her maternal uncle; Breast cancer in her  cousin and maternal aunt; Diabetes in her mother; Hypertension in her mother; Stroke in her mother.    ROS:  Please see the history of present illness.   Otherwise, review of systems are positive for {NONE DEFAULTED:18576::"none"}.   All other systems are reviewed and negative.    PHYSICAL EXAM: VS:  There were no vitals taken for this visit. , BMI There is no height or weight on file to calculate BMI. GEN: Well nourished, well developed, in no acute distress  HEENT: normal  Neck: no JVD, carotid bruits, or masses Cardiac: ***RRR; no murmurs, rubs, or gallops,no edema  Respiratory:  clear to auscultation bilaterally, normal work of breathing GI: soft, nontender, nondistended, + BS MS: no deformity or atrophy  Skin: warm and dry, no rash Neuro:  Strength and sensation are intact Psych: euthymic mood, full affect   EKG:  EKG {ACTION; IS/IS HYH:[id number]} ordered today. The ekg ordered today demonstrates ***   Recent Labs: 10/05/2019: TSH 0.545 11/01/2019: Magnesium 2.7 08/15/2020: ALT 180; BUN 14; Creatinine, Ser 0.41; Hemoglobin 12.4; Platelets 73; Potassium 3.6; Sodium 137    Lipid Panel    Component Value Date/Time   CHOL 136 01/18/2017 0957   TRIG 127 01/18/2017 0957   HDL 19 (L) 01/18/2017 0957   CHOLHDL 7.2 (H) 01/18/2017 0957   VLDL 25 01/18/2017 0957   LDLCALC 92 01/18/2017 0957      Wt Readings from Last 3 Encounters:  08/15/20 128 lb 6.4 oz (58.2 kg)  08/14/20 132 lb (59.9 kg)  08/01/20 131 lb (59.4 kg)      Other studies Reviewed: Additional studies/ records that were reviewed today include: ***. Review of the above records demonstrates: ***  No flowsheet data found.    ASSESSMENT AND PLAN:  1.  Paroxysmal supraventricular tachycardia: No recurrent arrhythmia on current dose of flecainide 50 mg twice daily.  2. Chemotherapy induced cardiomyopathy:   This was due to Herceptin with subsequent improvement in LV systolic function to normal.  3. Essential  hypertension: she is no longer having episodes of hypotension and she is currently not on any antihypertensive medications.  4.  Metastatic breast cancer, followed closely by oncology.      Disposition:   FU with *** in {gen number 2-82:060156} {Days to years:10300}  Signed,  Kathlyn Sacramento, MD  09/10/2020 2:11 PM    [facility]

## 2020-09-11 ENCOUNTER — Encounter: Payer: Self-pay | Admitting: Cardiovascular Disease

## 2020-09-17 ENCOUNTER — Telehealth: Payer: Self-pay | Admitting: *Deleted

## 2020-09-17 NOTE — Telephone Encounter (Signed)
Hospice orders to be refaxed.

## 2020-09-17 NOTE — Telephone Encounter (Signed)
Hospice called reporting that the first page of orders was refaxed needing a signature and that they need this back ASP

## 2020-09-24 ENCOUNTER — Other Ambulatory Visit: Payer: Self-pay

## 2020-09-24 MED ORDER — FLECAINIDE ACETATE 50 MG PO TABS: 50.0000 mg | ORAL_TABLET | Freq: Two times a day (BID) | ORAL | 0 refills | Status: AC

## 2020-09-28 DEATH — deceased

## 2021-01-14 ENCOUNTER — Ambulatory Visit: Payer: Medicare HMO

## 2022-07-23 NOTE — Progress Notes (Signed)
hospitalized
